# Patient Record
Sex: Female | Born: 1935
Health system: Southern US, Community
[De-identification: ages and names within clinical notes are randomized; demographics above are authoritative.]

## PROBLEM LIST (undated history)

## (undated) DIAGNOSIS — M1990 Unspecified osteoarthritis, unspecified site: Secondary | ICD-10-CM

## (undated) DIAGNOSIS — R609 Edema, unspecified: Secondary | ICD-10-CM

## (undated) DIAGNOSIS — Z96643 Presence of artificial hip joint, bilateral: Secondary | ICD-10-CM

## (undated) DIAGNOSIS — E876 Hypokalemia: Secondary | ICD-10-CM

## (undated) DIAGNOSIS — I824Z2 Acute embolism and thrombosis of unspecified deep veins of left distal lower extremity: Secondary | ICD-10-CM

## (undated) DIAGNOSIS — M19019 Primary osteoarthritis, unspecified shoulder: Secondary | ICD-10-CM

## (undated) DIAGNOSIS — M359 Systemic involvement of connective tissue, unspecified: Secondary | ICD-10-CM

## (undated) DIAGNOSIS — I82409 Acute embolism and thrombosis of unspecified deep veins of unspecified lower extremity: Secondary | ICD-10-CM

## (undated) DIAGNOSIS — I639 Cerebral infarction, unspecified: Secondary | ICD-10-CM

## (undated) DIAGNOSIS — Z90710 Acquired absence of both cervix and uterus: Secondary | ICD-10-CM

## (undated) DIAGNOSIS — I2699 Other pulmonary embolism without acute cor pulmonale: Secondary | ICD-10-CM

## (undated) DIAGNOSIS — M2040 Other hammer toe(s) (acquired), unspecified foot: Secondary | ICD-10-CM

## (undated) DIAGNOSIS — Z7901 Long term (current) use of anticoagulants: Secondary | ICD-10-CM

## (undated) DIAGNOSIS — I10 Essential (primary) hypertension: Secondary | ICD-10-CM

## (undated) DIAGNOSIS — M2669 Other specified disorders of temporomandibular joint: Secondary | ICD-10-CM

## (undated) DIAGNOSIS — Z95828 Presence of other vascular implants and grafts: Secondary | ICD-10-CM

## (undated) DIAGNOSIS — R32 Unspecified urinary incontinence: Secondary | ICD-10-CM

## (undated) DIAGNOSIS — D45 Polycythemia vera: Secondary | ICD-10-CM

## (undated) DIAGNOSIS — B351 Tinea unguium: Secondary | ICD-10-CM

## (undated) DIAGNOSIS — Z933 Colostomy status: Secondary | ICD-10-CM

## (undated) DIAGNOSIS — R262 Difficulty in walking, not elsewhere classified: Secondary | ICD-10-CM

## (undated) DIAGNOSIS — I509 Heart failure, unspecified: Secondary | ICD-10-CM

## (undated) DIAGNOSIS — K5791 Diverticulosis of intestine, part unspecified, without perforation or abscess with bleeding: Secondary | ICD-10-CM

## (undated) HISTORY — PX: TOTAL HIP ARTHROPLASTY: SHX124

## (undated) HISTORY — DX: Long term (current) use of anticoagulants: Z79.01

## (undated) HISTORY — DX: Other pulmonary embolism without acute cor pulmonale: I26.99

## (undated) HISTORY — DX: Presence of artificial hip joint, bilateral: Z96.643

## (undated) HISTORY — DX: Other hammer toe(s) (acquired), unspecified foot: M20.40

## (undated) HISTORY — PX: TOE AMPUTATION: SHX809

## (undated) HISTORY — PX: ABDOMINAL HYSTERECTOMY: SHX81

## (undated) HISTORY — DX: Presence of other vascular implants and grafts: Z95.828

## (undated) HISTORY — PX: FOOT AMPUTATION: SHX951

## (undated) HISTORY — DX: Essential (primary) hypertension: I10

## (undated) HISTORY — DX: Hypokalemia: E87.6

## (undated) HISTORY — PX: COLON SURGERY: SHX602

## (undated) HISTORY — DX: Acquired absence of both cervix and uterus: Z90.710

## (undated) HISTORY — DX: Difficulty in walking, not elsewhere classified: R26.2

## (undated) HISTORY — DX: Unspecified urinary incontinence: R32

## (undated) HISTORY — DX: Unspecified osteoarthritis, unspecified site: M19.90

## (undated) HISTORY — PX: JOINT REPLACEMENT: SHX530

## (undated) HISTORY — DX: Acute embolism and thrombosis of unspecified deep veins of left distal lower extremity: I82.4Z2

## (undated) HISTORY — DX: Colostomy status: Z93.3

## (undated) HISTORY — DX: Tinea unguium: B35.1

## (undated) HISTORY — PX: IVC FILTER INSERTION: CATH118245

## (undated) HISTORY — DX: Acute embolism and thrombosis of unspecified deep veins of unspecified lower extremity: I82.409

## (undated) HISTORY — DX: Primary osteoarthritis, unspecified shoulder: M19.019

## (undated) HISTORY — DX: Diverticulosis of intestine, part unspecified, without perforation or abscess with bleeding: K57.91

## (undated) HISTORY — DX: Other specified disorders of temporomandibular joint: M26.69

---

## 2011-11-28 ENCOUNTER — Ambulatory Visit: Payer: Self-pay | Admitting: Orthopedic Surgery

## 2012-02-19 ENCOUNTER — Inpatient Hospital Stay: Payer: Self-pay | Admitting: *Deleted

## 2012-02-19 LAB — CBC
MCH: 35.9 pg — ABNORMAL HIGH (ref 26.0–34.0)
MCHC: 32.8 g/dL (ref 32.0–36.0)
MCV: 110 fL — ABNORMAL HIGH (ref 80–100)

## 2012-02-19 LAB — URINALYSIS, COMPLETE
Bacteria: NONE SEEN
Bilirubin,UR: NEGATIVE
Blood: NEGATIVE
Glucose,UR: NEGATIVE mg/dL (ref 0–75)
Ketone: NEGATIVE
Leukocyte Esterase: NEGATIVE
Nitrite: NEGATIVE
Ph: 6 (ref 4.5–8.0)
Protein: NEGATIVE
RBC,UR: 2 /HPF (ref 0–5)
Specific Gravity: 1.013 (ref 1.003–1.030)
Squamous Epithelial: 3
WBC UR: 3 /HPF (ref 0–5)

## 2012-02-19 LAB — COMPREHENSIVE METABOLIC PANEL
Anion Gap: 10 (ref 7–16)
Bilirubin,Total: 6.9 mg/dL — ABNORMAL HIGH (ref 0.2–1.0)
Calcium, Total: 8.8 mg/dL (ref 8.5–10.1)
Potassium: 3.3 mmol/L — ABNORMAL LOW (ref 3.5–5.1)
Sodium: 136 mmol/L (ref 136–145)
Total Protein: 7.5 g/dL (ref 6.4–8.2)

## 2012-02-19 LAB — TROPONIN I: Troponin-I: 0.04 ng/mL

## 2012-02-20 LAB — COMPREHENSIVE METABOLIC PANEL
Anion Gap: 10 (ref 7–16)
BUN: 28 mg/dL — ABNORMAL HIGH (ref 7–18)
Bilirubin,Total: 5.4 mg/dL — ABNORMAL HIGH (ref 0.2–1.0)
Chloride: 106 mmol/L (ref 98–107)
Co2: 24 mmol/L (ref 21–32)
Creatinine: 1.62 mg/dL — ABNORMAL HIGH (ref 0.60–1.30)
EGFR (African American): 35 — ABNORMAL LOW
EGFR (Non-African Amer.): 30 — ABNORMAL LOW
Glucose: 95 mg/dL (ref 65–99)
Potassium: 2.9 mmol/L — ABNORMAL LOW (ref 3.5–5.1)
SGOT(AST): 111 U/L — ABNORMAL HIGH (ref 15–37)
SGPT (ALT): 184 U/L — ABNORMAL HIGH
Sodium: 140 mmol/L (ref 136–145)
Total Protein: 6.7 g/dL (ref 6.4–8.2)

## 2012-02-20 LAB — CBC WITH DIFFERENTIAL/PLATELET
Basophil #: 0 10*3/uL (ref 0.0–0.1)
Basophil %: 0.2 %
Eosinophil %: 0.2 %
HCT: 42.3 % (ref 35.0–47.0)
HGB: 13.8 g/dL (ref 12.0–16.0)
Lymphocyte #: 0.6 10*3/uL — ABNORMAL LOW (ref 1.0–3.6)
Lymphocyte %: 7.1 %
MCHC: 32.5 g/dL (ref 32.0–36.0)
MCV: 109 fL — ABNORMAL HIGH (ref 80–100)
Monocyte #: 0.6 x10 3/mm (ref 0.2–0.9)
Monocyte %: 6.4 %
Neutrophil %: 86.1 %
RDW: 16.5 % — ABNORMAL HIGH (ref 11.5–14.5)
WBC: 8.8 10*3/uL (ref 3.6–11.0)

## 2012-02-20 LAB — MAGNESIUM: Magnesium: 2 mg/dL

## 2012-02-20 LAB — IRON AND TIBC
Iron Bind.Cap.(Total): 159 ug/dL — ABNORMAL LOW (ref 250–450)
Iron Saturation: 31 %
Iron: 50 ug/dL (ref 50–170)
Unbound Iron-Bind.Cap.: 109 ug/dL

## 2012-02-20 LAB — CK TOTAL AND CKMB (NOT AT ARMC)
CK, Total: 49 U/L (ref 21–215)
CK-MB: 1 ng/mL (ref 0.5–3.6)

## 2012-02-20 LAB — LIPID PANEL
Cholesterol: 64 mg/dL (ref 0–200)
HDL Cholesterol: 10 mg/dL — ABNORMAL LOW (ref 40–60)
Ldl Cholesterol, Calc: 42 mg/dL (ref 0–100)
Triglycerides: 61 mg/dL (ref 0–200)
VLDL Cholesterol, Calc: 12 mg/dL (ref 5–40)

## 2012-02-20 LAB — PROTIME-INR: INR: 1.5

## 2012-02-20 LAB — LIPASE, BLOOD: Lipase: 93 U/L (ref 73–393)

## 2012-02-21 LAB — COMPREHENSIVE METABOLIC PANEL
Alkaline Phosphatase: 158 U/L — ABNORMAL HIGH (ref 50–136)
Anion Gap: 12 (ref 7–16)
BUN: 32 mg/dL — ABNORMAL HIGH (ref 7–18)
Bilirubin,Total: 3.5 mg/dL — ABNORMAL HIGH (ref 0.2–1.0)
Calcium, Total: 7.9 mg/dL — ABNORMAL LOW (ref 8.5–10.1)
Chloride: 109 mmol/L — ABNORMAL HIGH (ref 98–107)
Co2: 22 mmol/L (ref 21–32)
Creatinine: 2.14 mg/dL — ABNORMAL HIGH (ref 0.60–1.30)
EGFR (African American): 25 — ABNORMAL LOW
EGFR (Non-African Amer.): 22 — ABNORMAL LOW
Glucose: 94 mg/dL (ref 65–99)
Potassium: 3.7 mmol/L (ref 3.5–5.1)
SGPT (ALT): 126 U/L — ABNORMAL HIGH
Total Protein: 6.4 g/dL (ref 6.4–8.2)

## 2012-02-22 LAB — COMPREHENSIVE METABOLIC PANEL
Albumin: 2.1 g/dL — ABNORMAL LOW (ref 3.4–5.0)
Alkaline Phosphatase: 180 U/L — ABNORMAL HIGH (ref 50–136)
Anion Gap: 11 (ref 7–16)
Bilirubin,Total: 2.9 mg/dL — ABNORMAL HIGH (ref 0.2–1.0)
Chloride: 108 mmol/L — ABNORMAL HIGH (ref 98–107)
Glucose: 73 mg/dL (ref 65–99)
Osmolality: 281 (ref 275–301)
Potassium: 3.5 mmol/L (ref 3.5–5.1)
SGOT(AST): 37 U/L (ref 15–37)
Total Protein: 6.1 g/dL — ABNORMAL LOW (ref 6.4–8.2)

## 2012-02-23 LAB — COMPREHENSIVE METABOLIC PANEL
Alkaline Phosphatase: 204 U/L — ABNORMAL HIGH (ref 50–136)
Anion Gap: 11 (ref 7–16)
BUN: 13 mg/dL (ref 7–18)
Bilirubin,Total: 1.7 mg/dL — ABNORMAL HIGH (ref 0.2–1.0)
Chloride: 109 mmol/L — ABNORMAL HIGH (ref 98–107)
Co2: 23 mmol/L (ref 21–32)
EGFR (African American): 51 — ABNORMAL LOW
EGFR (Non-African Amer.): 44 — ABNORMAL LOW
Glucose: 99 mg/dL (ref 65–99)
Osmolality: 281 (ref 275–301)
Sodium: 141 mmol/L (ref 136–145)
Total Protein: 6.1 g/dL — ABNORMAL LOW (ref 6.4–8.2)

## 2012-02-23 LAB — MAGNESIUM: Magnesium: 1.5 mg/dL — ABNORMAL LOW

## 2012-02-24 DIAGNOSIS — I472 Ventricular tachycardia: Secondary | ICD-10-CM

## 2012-02-24 LAB — COMPREHENSIVE METABOLIC PANEL
Anion Gap: 7 (ref 7–16)
Co2: 26 mmol/L (ref 21–32)
Creatinine: 0.95 mg/dL (ref 0.60–1.30)
EGFR (African American): >60
EGFR (Non-African Amer.): 58 — ABNORMAL LOW
Osmolality: 283 (ref 275–301)
Sodium: 143 mmol/L (ref 136–145)
Total Protein: 7.2 g/dL (ref 6.4–8.2)

## 2012-02-24 LAB — STOOL CULTURE

## 2012-02-24 LAB — WBCS, STOOL

## 2012-02-24 LAB — MAGNESIUM: Magnesium: 1.5 mg/dL — ABNORMAL LOW

## 2012-02-25 DIAGNOSIS — I517 Cardiomegaly: Secondary | ICD-10-CM

## 2012-02-25 LAB — HEPATIC FUNCTION PANEL A (ARMC)
Albumin: 2.4 g/dL — ABNORMAL LOW (ref 3.4–5.0)
Alkaline Phosphatase: 263 U/L — ABNORMAL HIGH (ref 50–136)
Bilirubin, Direct: 0.7 mg/dL — ABNORMAL HIGH (ref 0.00–0.20)
Bilirubin,Total: 1.6 mg/dL — ABNORMAL HIGH (ref 0.2–1.0)
SGOT(AST): 53 U/L — ABNORMAL HIGH (ref 15–37)
SGPT (ALT): 77 U/L
Total Protein: 7.1 g/dL (ref 6.4–8.2)

## 2012-02-25 LAB — MAGNESIUM: Magnesium: 1.5 mg/dL — ABNORMAL LOW

## 2012-02-25 LAB — POTASSIUM: Potassium: 3.8 mmol/L (ref 3.5–5.1)

## 2012-02-26 ENCOUNTER — Encounter: Payer: Self-pay | Admitting: Internal Medicine

## 2012-03-03 ENCOUNTER — Encounter: Payer: Self-pay | Admitting: Cardiovascular Disease

## 2012-03-04 LAB — HEPATIC FUNCTION PANEL A (ARMC)
Alkaline Phosphatase: 155 U/L — ABNORMAL HIGH (ref 50–136)
Bilirubin,Total: 0.6 mg/dL (ref 0.2–1.0)
SGOT(AST): 21 U/L (ref 15–37)
SGPT (ALT): 25 U/L
Total Protein: 6.6 g/dL (ref 6.4–8.2)

## 2012-03-04 LAB — BASIC METABOLIC PANEL
Anion Gap: 8 (ref 7–16)
Calcium, Total: 8.3 mg/dL — ABNORMAL LOW (ref 8.5–10.1)
Chloride: 107 mmol/L (ref 98–107)
Co2: 27 mmol/L (ref 21–32)
Creatinine: 0.7 mg/dL (ref 0.60–1.30)
EGFR (Non-African Amer.): >60
Glucose: 88 mg/dL (ref 65–99)
Osmolality: 282 (ref 275–301)
Potassium: 3.9 mmol/L (ref 3.5–5.1)
Sodium: 142 mmol/L (ref 136–145)

## 2012-03-08 ENCOUNTER — Encounter: Payer: Self-pay | Admitting: Cardiovascular Disease

## 2012-03-10 ENCOUNTER — Encounter: Payer: Self-pay | Admitting: Internal Medicine

## 2012-03-12 ENCOUNTER — Encounter: Payer: Self-pay | Admitting: Cardiovascular Disease

## 2012-06-28 DIAGNOSIS — M19019 Primary osteoarthritis, unspecified shoulder: Secondary | ICD-10-CM | POA: Insufficient documentation

## 2012-06-28 HISTORY — DX: Primary osteoarthritis, unspecified shoulder: M19.019

## 2012-08-23 DIAGNOSIS — M25559 Pain in unspecified hip: Secondary | ICD-10-CM | POA: Insufficient documentation

## 2012-08-23 DIAGNOSIS — Z96649 Presence of unspecified artificial hip joint: Secondary | ICD-10-CM | POA: Insufficient documentation

## 2012-09-02 DIAGNOSIS — M2040 Other hammer toe(s) (acquired), unspecified foot: Secondary | ICD-10-CM | POA: Insufficient documentation

## 2012-09-02 DIAGNOSIS — Z89439 Acquired absence of unspecified foot: Secondary | ICD-10-CM | POA: Insufficient documentation

## 2012-09-02 DIAGNOSIS — B351 Tinea unguium: Secondary | ICD-10-CM | POA: Insufficient documentation

## 2012-09-02 DIAGNOSIS — Z96643 Presence of artificial hip joint, bilateral: Secondary | ICD-10-CM

## 2012-09-02 DIAGNOSIS — L988 Other specified disorders of the skin and subcutaneous tissue: Secondary | ICD-10-CM | POA: Insufficient documentation

## 2012-09-02 HISTORY — DX: Presence of artificial hip joint, bilateral: Z96.643

## 2012-09-02 HISTORY — DX: Tinea unguium: B35.1

## 2012-09-02 HISTORY — DX: Other hammer toe(s) (acquired), unspecified foot: M20.40

## 2013-12-02 ENCOUNTER — Emergency Department: Payer: Self-pay | Admitting: Emergency Medicine

## 2013-12-03 LAB — URINALYSIS, COMPLETE
Bilirubin,UR: NEGATIVE
Blood: NEGATIVE
Glucose,UR: NEGATIVE mg/dL (ref 0–75)
KETONE: NEGATIVE
NITRITE: POSITIVE
PH: 6 (ref 4.5–8.0)
Protein: NEGATIVE
RBC,UR: 1 /HPF (ref 0–5)
Specific Gravity: 1.019 (ref 1.003–1.030)
WBC UR: 4 /HPF (ref 0–5)

## 2014-03-18 ENCOUNTER — Emergency Department: Payer: Self-pay | Admitting: Emergency Medicine

## 2014-05-07 ENCOUNTER — Inpatient Hospital Stay: Payer: Self-pay | Admitting: Internal Medicine

## 2014-05-07 LAB — URINALYSIS, COMPLETE
Bilirubin,UR: NEGATIVE
Blood: NEGATIVE
GLUCOSE, UR: NEGATIVE mg/dL (ref 0–75)
Ketone: NEGATIVE
Nitrite: NEGATIVE
PH: 6 (ref 4.5–8.0)
PROTEIN: NEGATIVE
Specific Gravity: 1.02 (ref 1.003–1.030)

## 2014-05-07 LAB — COMPREHENSIVE METABOLIC PANEL
ALT: 14 U/L (ref 12–78)
ANION GAP: 9 (ref 7–16)
Albumin: 3.2 g/dL — ABNORMAL LOW (ref 3.4–5.0)
Alkaline Phosphatase: 92 U/L
BILIRUBIN TOTAL: 0.8 mg/dL (ref 0.2–1.0)
BUN: 17 mg/dL (ref 7–18)
CHLORIDE: 102 mmol/L (ref 98–107)
CO2: 26 mmol/L (ref 21–32)
Calcium, Total: 8.8 mg/dL (ref 8.5–10.1)
Creatinine: 0.92 mg/dL (ref 0.60–1.30)
EGFR (African American): >60
EGFR (Non-African Amer.): 59 — ABNORMAL LOW
Glucose: 115 mg/dL — ABNORMAL HIGH (ref 65–99)
Osmolality: 276 (ref 275–301)
Potassium: 2.4 mmol/L — CL (ref 3.5–5.1)
SGOT(AST): 18 U/L (ref 15–37)
Sodium: 137 mmol/L (ref 136–145)
TOTAL PROTEIN: 7.9 g/dL (ref 6.4–8.2)

## 2014-05-07 LAB — CBC
HCT: 44.6 % (ref 35.0–47.0)
HGB: 14.3 g/dL (ref 12.0–16.0)
MCH: 34.2 pg — AB (ref 26.0–34.0)
MCHC: 32 g/dL (ref 32.0–36.0)
MCV: 107 fL — ABNORMAL HIGH (ref 80–100)
PLATELETS: 840 10*3/uL — AB (ref 150–440)
RBC: 4.18 10*6/uL (ref 3.80–5.20)
RDW: 16 % — AB (ref 11.5–14.5)
WBC: 4.9 10*3/uL (ref 3.6–11.0)

## 2014-05-07 LAB — LIPASE, BLOOD: LIPASE: 75 U/L (ref 73–393)

## 2014-05-07 LAB — MAGNESIUM: Magnesium: 1.6 mg/dL — ABNORMAL LOW

## 2014-05-08 LAB — BASIC METABOLIC PANEL
ANION GAP: 4 — AB (ref 7–16)
BUN: 15 mg/dL (ref 7–18)
CALCIUM: 8.4 mg/dL — AB (ref 8.5–10.1)
CO2: 30 mmol/L (ref 21–32)
Chloride: 104 mmol/L (ref 98–107)
Creatinine: 0.74 mg/dL (ref 0.60–1.30)
EGFR (African American): >60
Glucose: 101 mg/dL — ABNORMAL HIGH (ref 65–99)
Osmolality: 277 (ref 275–301)
Potassium: 2.8 mmol/L — ABNORMAL LOW (ref 3.5–5.1)
SODIUM: 138 mmol/L (ref 136–145)

## 2014-05-08 LAB — POTASSIUM: Potassium: 3.7 mmol/L (ref 3.5–5.1)

## 2014-05-08 LAB — MAGNESIUM: MAGNESIUM: 2.6 mg/dL — AB

## 2014-05-09 LAB — BASIC METABOLIC PANEL
ANION GAP: 3 — AB (ref 7–16)
BUN: 16 mg/dL (ref 7–18)
CALCIUM: 8.6 mg/dL (ref 8.5–10.1)
CO2: 26 mmol/L (ref 21–32)
Chloride: 110 mmol/L — ABNORMAL HIGH (ref 98–107)
Creatinine: 0.75 mg/dL (ref 0.60–1.30)
EGFR (African American): >60
EGFR (Non-African Amer.): >60
GLUCOSE: 98 mg/dL (ref 65–99)
Osmolality: 279 (ref 275–301)
Potassium: 3.4 mmol/L — ABNORMAL LOW (ref 3.5–5.1)
Sodium: 139 mmol/L (ref 136–145)

## 2014-05-09 LAB — PLATELET COUNT: PLATELETS: 738 10*3/uL — AB (ref 150–440)

## 2014-07-08 ENCOUNTER — Emergency Department: Payer: Self-pay | Admitting: Internal Medicine

## 2014-07-08 LAB — URINALYSIS, COMPLETE
Bilirubin,UR: NEGATIVE
Blood: NEGATIVE
Glucose,UR: NEGATIVE mg/dL (ref 0–75)
KETONE: NEGATIVE
Nitrite: POSITIVE
PROTEIN: NEGATIVE
Ph: 6 (ref 4.5–8.0)
RBC,UR: 1 /HPF (ref 0–5)
SPECIFIC GRAVITY: 1.019 (ref 1.003–1.030)
Squamous Epithelial: 2
WBC UR: 45 /HPF (ref 0–5)

## 2014-07-08 LAB — COMPREHENSIVE METABOLIC PANEL
ALBUMIN: 2.9 g/dL — AB (ref 3.4–5.0)
ALT: 12 U/L — AB
AST: 23 U/L (ref 15–37)
Alkaline Phosphatase: 75 U/L
Anion Gap: 9 (ref 7–16)
BUN: 15 mg/dL (ref 7–18)
Bilirubin,Total: 0.6 mg/dL (ref 0.2–1.0)
CHLORIDE: 108 mmol/L — AB (ref 98–107)
Calcium, Total: 8.4 mg/dL — ABNORMAL LOW (ref 8.5–10.1)
Co2: 27 mmol/L (ref 21–32)
Creatinine: 0.64 mg/dL (ref 0.60–1.30)
EGFR (Non-African Amer.): >60
GLUCOSE: 90 mg/dL (ref 65–99)
OSMOLALITY: 287 (ref 275–301)
Potassium: 3.7 mmol/L (ref 3.5–5.1)
Sodium: 144 mmol/L (ref 136–145)
Total Protein: 7.1 g/dL (ref 6.4–8.2)

## 2014-07-08 LAB — CBC WITH DIFFERENTIAL/PLATELET
BASOS ABS: 0 10*3/uL (ref 0.0–0.1)
Basophil %: 0.8 %
EOS PCT: 2.3 %
Eosinophil #: 0.1 10*3/uL (ref 0.0–0.7)
HCT: 42 % (ref 35.0–47.0)
HGB: 13.8 g/dL (ref 12.0–16.0)
LYMPHS ABS: 0.8 10*3/uL — AB (ref 1.0–3.6)
Lymphocyte %: 21.1 %
MCH: 34.3 pg — AB (ref 26.0–34.0)
MCHC: 32.9 g/dL (ref 32.0–36.0)
MCV: 104 fL — ABNORMAL HIGH (ref 80–100)
MONOS PCT: 6.1 %
Monocyte #: 0.2 x10 3/mm (ref 0.2–0.9)
NEUTROS ABS: 2.6 10*3/uL (ref 1.4–6.5)
Neutrophil %: 69.7 %
Platelet: 834 10*3/uL — ABNORMAL HIGH (ref 150–440)
RBC: 4.02 10*6/uL (ref 3.80–5.20)
RDW: 18.3 % — ABNORMAL HIGH (ref 11.5–14.5)
WBC: 3.7 10*3/uL (ref 3.6–11.0)

## 2014-07-08 LAB — LIPASE, BLOOD: Lipase: 58 U/L — ABNORMAL LOW (ref 73–393)

## 2014-07-08 LAB — TROPONIN I

## 2014-11-20 ENCOUNTER — Ambulatory Visit: Payer: Self-pay | Admitting: Orthopedic Surgery

## 2015-01-10 DIAGNOSIS — M1990 Unspecified osteoarthritis, unspecified site: Secondary | ICD-10-CM | POA: Insufficient documentation

## 2015-01-10 DIAGNOSIS — W19XXXA Unspecified fall, initial encounter: Secondary | ICD-10-CM | POA: Insufficient documentation

## 2015-01-11 DIAGNOSIS — E860 Dehydration: Secondary | ICD-10-CM | POA: Insufficient documentation

## 2015-01-14 ENCOUNTER — Emergency Department: Payer: Self-pay | Admitting: Emergency Medicine

## 2015-03-03 NOTE — H&P (Signed)
Subjective/Chief Complaint Abdominal pain   History of Present Illness 81 year old female with history of polycythemia vera, HTN presents with abdominal pain for about 2 weeks. Abdominal pain is nonradiating, dull ache in the LLQ that is intermittent in nature and gets worse with movement and improves with rest. She saw her PCP 1 week ago who felt this may be exacerbation of her known diverticulitis. Subsequently pain got progressively and she devloped severe constipation, last BM was 05/02/14; 5 days prior to admission. She denies fevers, chills, vomiting but admits to some nause today. Of note she had a tomato sandwhich prior to abdominal pain onset. History was obtained from the patient, her granddaughter and review of medical records.  In ED she was noted to be severely hypokalemic with potassium 2.4 prompting admission. On review of medical records she was last hospitalized 2 years ago and had severe metabolic derangements, hypokalemia and hypomagnesemia and had intermittent episodes of nonsustained VT. Echocardiogram at the time showed preserved EF 55%. Per granddaughter patient has had hypokalemia since being on lasix for some leg swelling and is on low dose potassium supplement.   Past History PAST MEDICAL HISTORY:  1. Hypertension.  2. Polycythemia vera.  3. Deep venous thrombosis of the right lower extremity.  4.           Diverticulitis  PAST SURGICAL HISTORY:  1. Bilateral hip replacement.  2. 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  3.           Transmetatarsal amputation right foot. 4. Hysterectomy.   Primary Physician Dr. Darnell Level at [facility] [place]:  No Known Allergies:   HOME MEDICATIONS: Medication Instructions Status  hydroxyurea 500 mg oral capsule 3 caps (1548m) orally once a day on Monday, Tuesday, Wednesday, and Thursday, Take 4 caps (20048m orally  once a day on Friday, Saturday, and Sunday. Active  furosemide 20 mg oral tablet 1 tab(s) orally once a day as needed for swelling. Active  potassium chloride 20 mEq oral tablet, extended release 1 tab(s) orally once a day Active  Econazole Nitrate 1% topical cream Apply topically to left foot 2 times a day Active  acetaminophen-HYDROcodone 325 mg-5 mg oral tablet 1 tab(s) orally once a day, As Needed - for Pain Active  aspirin 81 mg oral tablet 1 tab(s) orally once a day Active  amLODIPine 2.5 mg oral tablet 1 tab(s) orally once a day Active  hydrochlorothiazide 50 mg oral tablet 1 tab(s) orally once a day Active   Family and Social History:  Family History Hypertension  arthritis   Social History negative tobacco, negative ETOH, negative Illicit drugs   Place of Living Independent living facility   Review of Systems:  Subjective/Chief Complaint abdominal pain   Fever/Chills No  no fatigue   Cough No   Sputum No   Abdominal Pain Yes   Diarrhea No   Constipation Yes   Nausea/Vomiting Yes  no vomiting   SOB/DOE No   Chest Pain No  no palpitations, no leg swelling   Telemetry Reviewed NSR   Dysuria No   Tolerating PT Yes   Tolerating Diet Yes   ROS No eye pain, no blurred vomiting, no tinnitus, no ear pain,  no arthralgias, no myalgias, no skin rashes, no frequency, no dysuria, no heat/cold intolerance   Medications/Allergies Reviewed Medications/Allergies reviewed   Physical Exam:  GEN well  developed, well nourished, 111/83, 59, 56, 94% on RA   HEENT PERRL, hearing intact to voice, moist oral mucosa, Oropharynx clear, good dentition, normal eye lids   NECK supple  No masses   RESP normal resp effort  clear BS   CARD regular rate  no murmur  No LE edema  no JVD   ABD denies tenderness  soft  normal BS  no Adominal Mass   EXTR negative cyanosis/clubbing, negative edema, TMA right foot   SKIN normal to palpation, No rashes, skin turgor good   NEURO  cranial nerves intact, follows commands, motor/sensory function intact   PSYCH alert, A+O to time, place, person, good insight   Lab Results: Hepatic:  28-Jun-15 11:32   Bilirubin, Total -  Alkaline Phosphatase - (45-117 NOTE: New Reference Range 09/30/13)  SGPT (ALT) -  SGOT (AST) -  Total Protein, Serum -  Albumin, Serum -    12:11   Bilirubin, Total 0.8  Alkaline Phosphatase 92 (45-117 NOTE: New Reference Range 09/30/13)  SGPT (ALT) 14  SGOT (AST) 18  Total Protein, Serum 7.9  Albumin, Serum  3.2  Routine Chem:  28-Jun-15 11:32   Lipase - (Result(s) reported on 07 May 2014 at 12:07PM.)  Glucose, Serum -  BUN -  Creatinine (comp) -  Sodium, Serum -  Potassium, Serum -  Chloride, Serum -  CO2, Serum -  Calcium (Total), Serum -  Osmolality (calc) -  eGFR (African American) -  eGFR (Non-African American) - (eGFR values <22m/min/1.73 m2 may be an indication of chronic kidney disease (CKD). Calculated eGFR is useful in patients with stable renal function. The eGFR calculation will not be reliable in acutely ill patients when serum creatinine is changing rapidly. It is not useful in  patients on dialysis. The eGFR calculation may not be applicable to patients at the low and high extremes of body sizes, pregnant women, and vegetarians.)  Result Comment chem labs - cancel specimen hemolyzed  - called to [phone] - collected by [email or web address] [phone]  - 05/07/2014 caf  Result(s) reported on 07 May 2014 at 12:07PM.  Anion Gap -    12:11   Magnesium, Serum  1.6 (1.8-2.4 THERAPEUTIC RANGE: 4-7 mg/dL TOXIC: > 10 mg/dL  -----------------------)  Lipase 75 (Result(s) reported on 07 May 2014 at 12:37PM.)  Glucose, Serum  115  BUN 17  Creatinine (comp) 0.92  Sodium, Serum 137  Potassium, Serum  2.4  Chloride, Serum 102  CO2, Serum 26  Calcium (Total), Serum 8.8  Osmolality (calc) 276  eGFR (African American) >60  eGFR (Non-African American)  59 (eGFR  values <665mmin/1.73 m2 may be an indication of chronic kidney disease (CKD). Calculated eGFR is useful in patients with stable renal function. The eGFR calculation will not bereliable in acutely ill patients when serum creatinine is changing rapidly. It is not useful in  patients on dialysis. The eGFR calculation may not be applicable to patients at the low and high extremes of body sizes, pregnant women, and vegetarians.)  Result Comment POTASSIUM      - RESULTS VERIFIED BY REPEAT TESTING.  - NOTIFIED OF CRITICAL VALUE  - CALLED TO JENNIFER INGERSOLL @ 1248 ON  - 05/07/2014  - READ-BACK PROCESS PERFORMED.  Result(s) reported on 07 May 2014 at 12:41PM.  Anion Gap 9  Routine UA:  28-Jun-15 13:25   Color (UA) Amber  Clarity (UA) Hazy  Glucose (UA) Negative  Bilirubin (UA) Negative  Ketones (UA)  Negative  Specific Gravity (UA) 1.020  Blood (UA) Negative  pH (UA) 6.0  Protein (UA) Negative  Nitrite (UA) Negative  Leukocyte Esterase (UA) Trace (Result(s) reported on 07 May 2014 at 01:47PM.)  RBC (UA) 1 /HPF  WBC (UA) 23 /HPF  Bacteria (UA) 3+  Epithelial Cells (UA) 2 /HPF  Mucous (UA) PRESENT (Result(s) reported on 07 May 2014 at 01:47PM.)  Routine Hem:  28-Jun-15 11:32   WBC (CBC) 4.9  RBC (CBC) 4.18  Hemoglobin (CBC) 14.3  Hematocrit (CBC) 44.6  Platelet Count (CBC)  840 (Result(s) reported on 07 May 2014 at 11:42AM.)  MCV  107  MCH  34.2  MCHC 32.0  RDW  16.0   Radiology Results: XRay:    28-Jun-15 11:57, Abdomen 3 Way Includes PA Chest  Abdomen 3 Way Includes PA Chest  REASON FOR EXAM:    diffuse abd pain with movement, last BM 1 week ago  COMMENTS:   May transport without cardiac monitor    PROCEDURE: DXR - DXR ABDOMEN 3-WAY (INCL PA CXR)  - May 07 2014 11:57AM     CLINICAL DATA:  Diffuse abdominal pain. Last bowel movement 1 week  ago.    EXAM:  ABDOMEN SERIES    COMPARISON:  None.    FINDINGS:  Unchanged appearance of the chest. Borderline heart  size.  Subsegmental atelectasis. Elevation of the RIGHT hemidiaphragm with  eventration. No free air underneath the hemidiaphragms.  Nonobstructive bowel gas pattern. No pathologic air-fluid levels.  Moderate stool burden. No bowel dilation. Severe lower lumbar  spondylosis incidentally noted. Bilateral total hip arthroplasties.     IMPRESSION:  No acute abnormality.  Moderate stool burden.      Electronically Signed    By: Dereck Ligas M.D.    On: 05/07/2014 12:06       Verified By: Melvyn Novas, M.D.,    Assessment/Admission Diagnosis Telemetry reviewed by me: NSR at 60 bpm EKG: NSR at 63 bpm, viewed by me  81 year old female with probable acute diverticulitis, severe constipation and diruetic induced severe hypokalemia and hypomagnesemia   Plan 1. Severe hypokalemia - s/p 19mq of potassium in ED - Addional 80 meq PO, then recheck potassium in Am - replete Mg - place on telemetry - would increase daily potassium supplement on discharge  2. Probable acute diverticulitis - IV cipro and flagyl empirically. - if abdominal symptoms worse, check CT abdomen  3. Hypomagnesemia - replete - check magnesium level in am  4. Severe constipation - Aggressive bowel regimen, miralax BID, dulcolax daily, docusate BID  5. Polycythemia Vera, stable. Continue hydroxyurea and ASA  6. Essential Hypertension: stable on HCTZ, lasix, norvasc.   F/E/N: HH diet DVT prophylaxis: Lovenox  Code status: FULL CODE, surrogate decision maker, Grand daughter BKarleen [phone] Time Spent 70 minutes   Electronic Signatures: OSamson Frederic(MD)  (Signed 28-Jun-15 15:32)  Authored: CHIEF COMPLAINT and HISTORY, ALLERGIES, HOME MEDICATIONS, FAMILY AND SOCIAL HISTORY, REVIEW OF SYSTEMS, PHYSICAL EXAM, LABS, Radiology, ASSESSMENT AND PLAN   Last Updated: 28-Jun-15 15:32 by OSamson Frederic(MD)

## 2015-03-03 NOTE — Discharge Summary (Signed)
PATIENT NAME:  Yolanda Wells, Yolanda Wells MR#:  [id number] DATE OF BIRTH:  15-Sep-1932  DATE OF ADMISSION:  05/07/2014 DATE OF DISCHARGE:  05/09/2014  PRIMARY CARE PHYSICIAN: Nonlocal.  FINAL DIAGNOSES:  1. Severe hypokalemia.  2. Hypomagnesemia.  3. Abdominal pain and constipation.  4. Hypertension.  5. Polycythemia vera with thrombocytosis.  6. Possible urinary tract infection. Recommend checking a potassium and magnesium level in followup appointment.   MEDICATIONS ON DISCHARGE: Include aspirin 81 mg daily, amlodipine 2.5 mg daily, hydroxyurea 500 mg 3 capsules on Monday, Tuesday, Wednesday, and Thursday 4 capsules on Friday, Saturday, and Sunday. Lasix 20 mg once a day as needed for swelling, potassium chloride 20 mEq daily, econazole nitrate 1% topical cream apply to left foot twice a day, acetaminophen hydrocodone 325/5 one 1 tablet once a day as needed for pain, MiraLax 17 grams once a day, Cipro 500 mg 1 tablet every 12 hours for 3 days. Stop hydrochlorothiazide.   DIET: Low sodium diet.   Home health with physical therapy.   ACTIVITY: As tolerated.   FOLLOWUP: Followup 1 to 2 weeks with your medical doctor.   HOSPITAL COURSE: The patient was admitted 05/07/2014, discharged 05/09/2014. Came in with weakness, abdominal pain.   LABORATORY AND RADIOLOGICAL DATA DURING THE HOSPITAL COURSE: Included a white blood cell count of 4.9, hemoglobin 14.3, platelet count of 840,000.  Chest and abdominal x-ray showed no acute abnormality, moderate stool burden.   Magnesium 1.6, lipase 75, glucose 115, BUN 17, creatinine 0.92, sodium 137, potassium 2.4, chloride 102, CO2 of 26, calcium 8.8. Liver function tests normal range. Urinalysis positive for trace leukocyte esterase, 3+ bacteria.   EKG: Normal sinus rhythm, left axis deviation, right bundle branch block.   Magnesium supplemented up to 2.6, potassium on the 29th still low at 2.8. Platelet count on discharge 738,000, potassium upon discharge up to  3.4.  HOSPITAL COURSE PER PROBLEM LIST:  1. For the patient's severe hypokalemia: This was replaced aggressively during the entire hospital course. I believe the etiology of the hypokalemia is the high-dose hydrochlorothiazide. This was stopped. Can continue her usual potassium supplementation as an outpatient. Recheck potassium as outpatient followup appointment. The patient does also take Lasix as needed and can continue that.  2. Hypomagnesemia, I believe this was also secondary to the hydrochlorothiazide. This was replaced aggressively during the hospitalization replaced actually higher than normal range. No need for magnesium supplementation upon discharge.  3. Abdominal pain and constipation. Normally the patient does not have constipation. Her abdominal pain improved after a bowel movement. I put her on MiraLax daily.  4. Hypertension. Blood pressure is stable off the hydrochlorothiazide. The patient is on amlodipine.  5. Polycythemia vera with thrombocytosis. Follow up platelet count as outpatient. I believe the platelet count was up higher than her usual secondary to the platelets being acute phase reactant when the patient is ill and in the hospital. Follow up as outpatient.  6. Possible urinary tract infection. Unfortunately, no urine culture was sent in the hospital. The patient was empirically started on Cipro and Flagyl thinking this could be a possible diverticulitis. I did stop those antibiotics but since the urinalysis was positive, I did give a few more days of Cipro.  TIME SPENT ON DISCHARGE: 35 minutes.     ____________________________ Tana Conch. Leslye Peer, MD rjw:lt D: 05/09/2014 14:20:44 ET T: 05/09/2014 21:15:54 ET JOB#: [id number]  cc: Tana Conch. Leslye Peer, MD, <Dictator> Marisue Brooklyn MD ELECTRONICALLY SIGNED 05/11/2014 16:19

## 2015-03-04 NOTE — Consult Note (Signed)
Chief Complaint:   Subjective/Chief Complaint LFT continues to    improve. Still with some diarrhea. Stool studies neg. No nausea. Prior exposure to Hep A and B according to serologies. Other liver serologies pending.   VITAL SIGNS/ANCILLARY NOTES: **Vital Signs.:   15-Apr-13 11:40   Vital Signs Type Routine   Temperature Temperature (F) 98.2   Celsius 36.7   Temperature Source oral   Pulse Pulse 68   Respirations Respirations 20   Systolic BP Systolic BP 520   Diastolic BP (mmHg) Diastolic BP (mmHg) 78   Mean BP 93   BP Source Dinamap   Pulse Ox % Pulse Ox % 97   Pulse Ox Activity Level  At rest   Oxygen Delivery Room Air/ 21 %   Brief Assessment:   Cardiac Regular    Respiratory clear BS    Gastrointestinal Normal   Routine Chem:  15-Apr-13 05:42    Glucose, Serum 99   BUN 13   Creatinine (comp) 1.19   Sodium, Serum 141   Potassium, Serum 3.1   Chloride, Serum 109   CO2, Serum 23   Calcium (Total), Serum 7.3  Hepatic:  15-Apr-13 05:42    Bilirubin, Total 1.7   Alkaline Phosphatase 204   SGPT (ALT) 75   SGOT (AST) 46   Total Protein, Serum 6.1   Albumin, Serum 2.0  Routine Chem:  15-Apr-13 05:42    Osmolality (calc) 281   eGFR (African American) 51   eGFR (Non-African American) 44   Anion Gap 11   Magnesium, Serum 1.5   Assessment/Plan:  Assessment/Plan:   Assessment LFT improving. Diarrhea persists.    Plan Try solids. Ok to try imodium prn if stool neg. I will be out tomorrow and back on Wed. If patient to be discharged tomorrow, then make sure patient f/u with [place] in office in 1-2 wks. Thanks.   Electronic Signatures: Verdie Shire (MD)  (Signed 15-Apr-13 12:45)  Authored: Chief Complaint, VITAL SIGNS/ANCILLARY NOTES, Brief Assessment, Lab Results, Assessment/Plan   Last Updated: 15-Apr-13 12:45 by Verdie Shire (MD)

## 2015-03-04 NOTE — Discharge Summary (Signed)
PATIENT NAME:  Yolanda Wells, Yolanda Wells MR#:  [id number] DATE OF BIRTH:  1932-05-25  DATE OF ADMISSION:  02/19/2012 DATE OF DISCHARGE:  02/25/2012  ADDENDUM: I have  adjusted her blood pressure medications now. She is on the following. 1. Aspirin 81 mg daily.  2. Potassium chloride 20 mEq daily.  3. Hydroxyurea 5 mg three capsules orally every other day alternating with two capsules orally every other day.  4. Naproxen 500 mg once daily as needed for joint pain.  5. Change amlodipine to 5 mg once daily. 6. Metoprolol 25 mg p.o. twice a day. 7. I have added Losartan at 50 mg p.o. once daily.  8. Magnesium oxide 400 mg p.o. twice a day for five days.   Her blood pressure medications can be further adjusted as an outpatient. ____________________________ Mena Pauls, MD ag:slb D: 02/25/2012 12:37:52 ET T: 02/25/2012 12:58:50 ET JOB#: [id number]  cc: Mena Pauls, MD, <Dictator> Mena Pauls MD ELECTRONICALLY SIGNED 03/04/2012 11:28

## 2015-03-04 NOTE — Consult Note (Signed)
Brief Consult Note: Diagnosis: Asymptomatic nonsustained ventricular tachycardia.   Patient was seen by consultant.   Consult note dictated.   Comments: Likely due to electrolytes abnormalities.  Recommend:  Replace K (>4) and Mag (>2).  Check TSH and 2 D ECho.  Agree with small dose Metoprolol.  Can consider outpatient stress test.  Electronic Signatures: Kathlyn Sacramento (MD)  (Signed 16-Apr-13 19:11)  Authored: Brief Consult Note   Last Updated: 16-Apr-13 19:11 by Kathlyn Sacramento (MD)

## 2015-03-04 NOTE — Consult Note (Signed)
Chief Complaint:   Subjective/Chief Complaint See Dawn Harrison's notes. Feels sl better. Still nauseous. LFt improving. Cr getting worse. GB wall thickened but patient in no pain. CBD appear normal on MRI.   VITAL SIGNS/ANCILLARY NOTES: **Vital Signs.:   13-Apr-13 08:12   Vital Signs Type Routine   Temperature Temperature (F) 98.4   Celsius 36.8   Temperature Source oral   Pulse Pulse 69   Pulse source per Dinamap   Respirations Respirations 18   Systolic BP Systolic BP 841   Diastolic BP (mmHg) Diastolic BP (mmHg) 69   Mean BP 87   BP Source Dinamap   Pulse Ox % Pulse Ox % 100   Pulse Ox Activity Level  At rest   Oxygen Delivery Room Air/ 21 %   Brief Assessment:   Cardiac Regular    Respiratory clear BS    Gastrointestinal Normal     Routine Chem:  13-Apr-13 05:12    Glucose, Serum 94   BUN 32   Creatinine (comp) 2.14   Sodium, Serum 143   Potassium, Serum 3.7   Chloride, Serum 109   CO2, Serum 22   Calcium (Total), Serum 7.9  Hepatic:  13-Apr-13 05:12    Bilirubin, Total 3.5   Alkaline Phosphatase 158   SGPT (ALT) 126   SGOT (AST) 53   Total Protein, Serum 6.4   Albumin, Serum 2.2  Routine Chem:  13-Apr-13 05:12    Osmolality (calc) 292   eGFR (African American) 25   eGFR (Non-African American) 22   Anion Gap 12   Magnesium, Serum 2.0   Radiology Results: MRI:    13-Apr-13 10:21, MRI Abdomen Without Contrast   MRI Abdomen Without Contrast    REASON FOR EXAM:    elevated LFT's abdnormal ultrasound  COMMENTS:       PROCEDURE: MR  - MR ABDOMEN WO CONTRAST  - Feb 21 2012 10:21AM     RESULT:     History: Elevated LFTs.    Comparison Study: Prior ultrasound of the abdomen of 02/19/2012.    Findings: Standard MRI of the abdomen was obtained. There is mild   atelectasis versus infiltrate right lung base. Liver is normal. Mild   thickening of the gallbladder wall is noted. Gallstones cannot be   excluded. Reference is made to recent  ultrasoundreport. Ultrasound gives     a more accurate depiction of gallbladder pathology. Minimal fullness   noted of the pancreatic head. No discrete mass identified. No biliary   ductal distention noted. Multiple right renal cysts are noted. Adrenals   are normal. Degenerative changes of the lumbar spine.    IMPRESSION:      1. Gallstones and thickening of the gallbladder wall. Reference is made   to recent ultrasound report. No focal hepatic abnormality or biliary   distention.     2. Renal cysts.      Thank you for the opportunity to contribute to the care of your patient.     Verified By: Osa Craver, M.D., MD   Assessment/Plan:  Assessment/Plan:   Assessment LFT abnormality. Gradually improving. Suspect hepatic process, either infectious, hypoxia/ischemic, etc. and not GB or biliry process.    Plan Await hepatitis serologies. Moniter renal function. IV hydration.   Electronic Signatures: Verdie Shire (MD)  (Signed 13-Apr-13 11:47)  Authored: Chief Complaint, VITAL SIGNS/ANCILLARY NOTES, Brief Assessment, Lab Results, Radiology Results, Assessment/Plan   Last Updated: 13-Apr-13 11:47 by Verdie Shire (MD)

## 2015-03-04 NOTE — Consult Note (Signed)
PATIENT NAME:  Yolanda Wells, Yolanda Wells MR#:  [id number] DATE OF BIRTH:  1932-09-13  DATE OF CONSULTATION:  02/21/2012  REFERRING PHYSICIAN:   CONSULTING PHYSICIAN:  Rodena Goldmann III, MD  PRIMARY CARE PHYSICIAN: [facility]   CHIEF COMPLAINT: Weakness, nausea, jaundice.    BRIEF HISTORY: Yolanda Wells is a 81 year old woman recently admitted to the hospital with weakness, malaise with some mild nausea. She vomited on two occasions. She has not been eating well over the last several days and noted that she was completely fatigued over the last couple of days. She was admitted to the hospital for further investigation as she was noted to be mildly hypotensive in the Emergency Room. Work-up in the Emergency Room revealed normal troponins but elevated liver function studies with a bilirubin of 6.9, alkaline phosphatase is 150 with SGOT of 230 and SGPT of 279. Gallbladder ultrasound was performed which revealed thickened gallbladder wall, what appeared to be multiple gallstones and possible cholesterolosis. CT scan of the chest was performed to rule pneumonia and she was noted to have some signs and symptoms on CT imaging consistent with possible portal hypertension. She does not carry any previous history of portal hypertension although she does have history of deep venous thrombosis bleed I believed to be related to polycythemia vera. She also has history of hypertension. She denies any other significant GI symptoms. Initially she and her family did not want to consider surgical evaluation but she began to vomit this morning and surgical evaluation was sought by the primary care team.   M.R.C.P. was performed this morning which demonstrated gallstones and thickening of the gallbladder wall with no evidence of any significant liver abnormality or ductal abnormality. Over the last several days her liver function studies have improved to where bilirubin is now 3.5 with alkaline phosphatase 158, SGOT 53 and SGPT of  126. Her white blood count is normal at 8800. She has a slightly elevated platelet count at near one-half million. She is slowly becoming azotemic with slight rise in her BUN and creatinine from 21 and 0.9 on admission to 32 and 2.14 today. Electrolytes are otherwise largely unremarkable. She does not have any evidence of acidosis.   CURRENT MEDICATIONS:  1. Amlodipine. 2. Hydroxyurea. 3. Aspirin. 4. Potassium.  5. Losartan.   ALLERGIES: She does not have any drug allergies.   PAST SURGICAL HISTORY:  1. Bilateral hip replacements. 2. Right toe amputations. 3. Hysterectomy.   PAST MEDICAL HISTORY: She has history of thyroid mass identified a number of years ago which has not been recently worked up, was ascribed to a benign condition in the past and she has not had any further investigation.   SOCIAL HISTORY: She lives in assisted living facility and does not had any significant tobacco or alcohol history.   REVIEW OF SYSTEMS: Reviewed from the medical admission and I agree with the findings.   PHYSICAL EXAMINATION:  GENERAL: She is sitting in a chair, comfortable, clinically jaundiced with no significant complaints at the present time. She did notice some nausea this morning and vomited one time but is not having any abdominal pain or nausea at the present time.   VITAL SIGNS: Blood pressure 120/76 with a heart rate of 78, respiratory rate 20 and she is afebrile. Oxygen saturation is satisfactory on room air.   HEENT: Reveals obvious scleral icterus. Pupils were equally round. She has no facial deformities.   NECK: Supple without adenopathy and her trachea is midline. I cannot palpate her  thyroid gland.   CHEST: Clear bilaterally with no adventitious sounds. No rhonchi. No rales. She appears to have normal pulmonary excursion.   CARDIAC: Exam reveals a 2/6 systolic murmur but no rhythm disturbances.   ABDOMEN: Benign with no significant abdominal tenderness. I cannot palpate her  gallbladder. She has active bowel sounds. No hernias are noted. She has no significant abdominal tenderness.   EXTREMITIES: Low extremity exam reveals surgical changes in her hips and right foot. She does have decreased pulses on both sides but no significant decreased range of motion.   PSYCHIATRIC: Normal orientation and she appears to be normal affect without depression.   LABORATORY, DIAGNOSTIC AND RADIOLOGICAL DATA: I have independently reviewed her laboratory work and her CT scan. These studies suggest that she does have evidence of cholecystolithiasis without evidence of acute cholecystitis.   IMPRESSION AND RECOMMENDATIONS: Her clinical presentation is not consistent with biliary tract disease. I would be concerned if she does have evidence of portal hypertension but the gallbladder wall thickening and jaundice are secondary to a liver problems rather than to her biliary tract. She certainly does not have a surgical situation at the present time and I do not see any indication for surgical intervention. At this point will continue to follow her and be available to assist in her management if required.   ____________________________ Micheline Maze, MD rle:cms D: 02/21/2012 14:27:03 ET T: 02/22/2012 09:24:53 ET JOB#: [id number]  cc: Rodena Goldmann III, MD, <Dictator> Rodena Goldmann MD ELECTRONICALLY SIGNED 02/23/2012 10:40

## 2015-03-04 NOTE — Discharge Summary (Signed)
PATIENT NAME:  Yolanda Wells, Yolanda Wells MR#:  [id number] DATE OF BIRTH:  Feb 05, 1932  DATE OF ADMISSION:  02/19/2012 DATE OF DISCHARGE:  02/25/2012  DISCHARGE DIAGNOSES:  1. Acute renal failure, resolved.  2. Elevated liver function tests, improving. 3. Leukocytosis, resolved. 4. Hypomagnesemia.  5. Hypokalemia.  6. Hypertension.  7. History of polycythemia vera. 8. History of deep vein thrombosis in the past.   CONSULTATIONS:  1. GI, Dr. Verdie Shire. 2. Surgery, Dr. Bronson Ing. 3. Cardiology, Dr. Fletcher Anon.   HOSPITAL COURSE: A 81 year old female. She presented with weakness, nausea, vomiting, and poor p.o. intake. She has history of hypertension and polycythemia vera. She was admitted as presumed pneumonia, elevated liver function tests and she was also hypotensive when she came in. When she presented to the Emergency Room she was noted to have elevated liver function tests with alkaline phosphatase of 150, ALT of 279, AST of 230 and bilirubin of 6.9. Her creatinine was normal at admission at 0.9 and she was slightly hypokalemic at 3.3. Her cardiac enzymes were negative. GI was consulted and she had extensive work-up done for elevated liver function tests. She had a CT chest with contrast done which showed atelectasis versus fibrosis versus possible infiltrate at the lung bases, suspicion of portal hypertension and noncalcified gallstones and there was also a 2.46 cm x 2.5 cm hyperenhancing mass in the anterior base of the thyroid. Patient also had an ultrasound of the abdomen done which showed thickened gallbladder wall, findings which represent cholesterolosis, diffuse thickening of the gallbladder wall as was sequelae of adenomatous cholecystitis. She also had an MRI of the abdomen done which showed gallstones and thickening of the gallbladder but patient did not have any abdominal pain, or any abdominal tenderness so this is most likely not cholecystitis. Dr. Pat Patrick saw her as a surgical consultant and said no  surgical indications at this time. Findings are most likely hepatic. Serology was also sent on the patient. She had hepatitis A antibody that was positive so she is hepatitis A immune. Hepatitis B surface antibody that was positive so she is hepatitis B immune, hepatitis B surface antigen negative. Hepatitis C virus antibody negative. Antinuclear antibody positive. Antimitochondrial antibody negative. Anti-smooth muscle antibody negative. Alpha-1 antitrypsin normal and she had acute hepatitis profile negative. Hepatitis C was negative. Her liver function has improved. Her bilirubin has come down to 1.6, alkaline phosphatase 263, ALT is normal at 77 and AST is 53. She needs to follow up with GI as an outpatient. Because of her presumed pneumonia she was also started on Zosyn at admission but her CT chest which actually showed more atelectasis than infiltrate. She started having diarrhea and her Zosyn was discontinued. She was also having some diarrhea during the hospital stay and her stool studies are all negative. Stool for WBCs was negative. Stool for C. difficile was negative. She also had elevated white count when she came in to 12,000 but it improved the next day. Her urinalysis was negative. She had some episodes of nonsustained V. tach during the hospital stay. She was also hypokalemic and hypomagnesemic. We have replaced her magnesium aggressively, given her 2 grams of magnesium sulfate at discharge. I am going to discharge her on magnesium oxide for five days. Also she takes a potassium pill every day, which she should continue. Her potassium is normal today at 3.8. Will try to keep the potassium above 4. We also started her on low dose beta blocker which I am going to continue. Cardiology  saw her and said possibly this nonsustained ventricular tachycardia asymptomatic secondary to electrolyte abnormalities. Check a TSH which is normal and patient may need an outpatient stress test so we are going to refer  her to cardiology as outpatient. She also developed acute renal failure during the hospital stay. Her creatinine went up to 2.14 with a BUN of 32, maybe because of hypotension and ATN and that has improved now. Her creatinine has improved to 0.95. For her hypertension I have increased her amlodipine to 5 mg daily and she is on low dose beta blocker. I have decresaed  her losartan at this time and also changed her naproxen to as needed for pain. Her ANA was positive. GI needs to follow up as outpatient when she follows with them. Her TSH is normal at 0.637. Patient had echo done which is pending at this time. Will follow the echo results before discharge.    MEDICATIONS AT DISCHARGE:  1. Aspirin 81 mg daily.  2. Potassium chloride 20 mEq daily.  3. Hydroxyurea 500 mg 3 capsules every other day alternating with 2 capsules every other day.  4. Naproxen 500 mg once a day as needed for joint pains.  5. Change amlodipine to 5 mg p.o. once daily. 6. Metoprolol 25 mg p.o. b.i.d.  7. Magnesium oxide 400 mg p.o. b.i.d. for five days.  8 Losartan 50 mg po once daily  CONDITION AT DISCHARGE: She is comfortable. Tolerating diet. Diarrhea has resolved. No chest pain. No shortness of breath. T-max 98.8 to 99.5, heart rate 65, blood pressure 131/67, saturating 100% on room air. Chest is clear. Heart sounds are regular. Abdomen soft, nontender. She has some lower extremity edema.   FOLLOW UP: Patient should follow up with Dr. Candace Cruise at [place] in 1 to 2 weeks. Follow up with Dr. Fletcher Anon, [facility], in 1 to 2 weeks for follow up on outpatient stress test. Follow up with M.D. at short-term rehab, Dr. Darnell Level at [facility], in one week. Follow-up BMP, LFTs and serum magnesium level in one week. Her thyroid mass needs to be followed up as outpatient. She had a thyroid workup in the past but this needs to be followed up.   TIME SPENT WITH DISCHARGE: 60 minutes.   ____________________________ Mena Pauls,  MD ag:cms D: 02/25/2012 12:12:36 ET T: 02/25/2012 12:28:09 ET JOB#: [id number]  cc: Mena Pauls, MD, <Dictator> [place] Candace Cruise, MD Mertie Clause Fletcher Anon, MD [place], MD Mena Pauls MD ELECTRONICALLY SIGNED 03/04/2012 11:27

## 2015-03-04 NOTE — Consult Note (Signed)
PATIENT NAME:  Yolanda Wells, Yolanda Wells MR#:  [id number] DATE OF BIRTH:  1932-05-28  DATE OF CONSULTATION:  02/20/2012  REFERRING PHYSICIAN:  Dr. Inez Catalina  CONSULTING PHYSICIAN:  Payton Emerald, NP/Dr. Verdie Shire   PRIMARY CARE PHYSICIAN: Dr. Harrell Gave with [facility]   REASON FOR CONSULTATION: Elevation in transaminase levels.   HISTORY OF PRESENT ILLNESS: Yolanda Wells is a 81 year old African American female with significant medical history of hypertension, polycythemia vera as well as right lower extremity deep vein thrombosis who presented to the Emergency Room for onset of weakness. Tuesday evening she states that she ate salad around 6:00 p.m. and around midnight she became very nauseated, ended up vomiting and vomiting continued throughout the night. Then she states on Wednesday she was feeling better but she had presented to senior center where her she was found by her friends to be dosing off throughout their activities which was very unusual for her. Yesterday during rehab she was unable to proceed with therapy. Therapist noted that this was unusual, checked her blood pressure, was found to be hypotensive with a blood pressure in the range of 87/62. Thus she was recommended to proceed to the Emergency Room for additional evaluation. She has had no associated abdominal pain. Up until the onset of nausea and vomiting on Tuesday she was doing normal activities of daily living. She resides at [facility]. No similar episodes to this in the past. She does note that two weeks ago that they were on isolation at [facility] for the concern of Norovirus. No nausea, no new medications. Bowel pattern normally is every other day. Denies any diarrhea. No blood. No melena. No heartburn, indigestion. Appetite has been good. Weight is stable. In fact patient had just eaten a regular tray prior to presenting to her room to interview.   PAST MEDICAL HISTORY:  1. Hypertension.  2. Polycythemia vera.  3. Deep venous  thrombosis of right lower extremity.  4. Diverticulosis.    PAST SURGICAL HISTORY:  1. Bilateral hip replacement. 2. 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 3. Status post hip surgery in 1998.  4. Hysterectomy.   ALLERGIES: None.   MEDICATIONS:  1. Amlodipine 2.5 mg daily. 2. Aspirin 81 mg a day.  3. Hydroxyurea 1500 mg alternating with 100 mg daily.  4. Losartan 100 mg a day. 5. Naproxen 500 mg a day.  6. Potassium chloride 20 mEq daily.   FAMILY HISTORY: Two sons history of cancer, one with prostate, other with multiple myeloma. Father deceased from myocardial infarction at the age of 4. Mother had a brain tumor. No colon cancer.   SOCIAL HISTORY: Resides at [facility]. No tobacco or alcohol or recreational drug use.   REVIEW OF SYSTEMS: CONSTITUTIONAL: No fevers. Significant for generalized weakness. EYES: No glaucoma, cataracts. ENT: No epistaxis or discharge. RESPIRATORY: Productive cough over the last couple of days, greenish color sputum. CARDIOVASCULAR: No chest pain, edema, heart palpitations, syncope, orthopnea. GASTROINTESTINAL: See history of present illness. GENITOURINARY: No dysuria, hematuria. ENDOCRINE: No polyuria or polydipsia though she does say that possibly she has enlarged thyroid in the past. MUSCULOSKELETAL: No joint swelling. NEUROLOGICAL: No history of cerebrovascular accident or transient ischemic attack. PSYCH: No depression. No anxiety.   PHYSICAL EXAMINATION:  VITAL SIGNS: Temperature 97.6, pulse 74, respirations 20, blood pressure 125/82, pulse oximetry 96%.   GENERAL: Well developed, overweight 81 year old [place] American female, no acute distress noted, pleasant. Granddaughter did arrive during  interviewing process present.   HEENT: Normocephalic, atraumatic. Pupils equal, reactive to light. Conjunctivae clear.   NECK: Supple. Trachea midline. No lymphadenopathy, thyromegaly.    PULMONARY: Symmetric rise and fall of chest. Clear to auscultation throughout.    CARDIOVASCULAR: Regular rhythm, S1, S2. No murmurs, no gallops.   ABDOMEN: Soft, nondistended. Bowel sounds in four quadrants. No bruits. No masses. No evidence of hepatosplenomegaly.   RECTAL: Deferred.   MUSCULOSKELETAL: Gait not assessed. Moving all four extremities. No contractures. No clubbing.   EXTREMITIES: No edema.   PSYCH: Alert and oriented x4. Memory grossly intact. Appropriate affect and mood.   NEUROLOGIC: No gross neurological deficits.   SKIN: Warm, dry. No lesions. No rashes.   LABORATORY, DIAGNOSTIC, AND RADIOLOGICAL DATA: Chemistry panel on admission: BUN is elevated at 21, potassium 3.3, calcium 8.8, otherwise within normal limits. Comparison today's date BUN elevated at 28, creatinine rose from 0.90 to 1.62, potassium declined to 2.9, EGFR less than 35, calcium declined to 8.3, triglycerides 61, cholesterol 64, magnesium 2.0. Hemoglobin A1c 5.9 and HDL was low at 10. Total protein on admission 7.5 with an albumin of 2.9, total bilirubin 6.9, alkaline phosphatase 150 which is normalized to 127 at 4:35 today. AST 230, ALT 279, improved to 111 and 184 respectively. Total bilirubin declined to 5.2 and albumin declined to 2.4. CK total three in series within normal limits. WBC count on admission was 12.0, platelet count was elevated at 585, MCV elevated at 110 with an MCH of 35.9 and RDW 17.4. White count is improved to 8.8. Hemoglobin declined from 14.7 to 13.8, hematocrit 44.8 to 42.3 and platelet count improved but still elevated at 492. PT 18.6 with an INR of 1.5. Urinalysis: RBCs 2 per high-power field, WBCs 3 per high-power field, epithelial cells 3 per high-power field. CT of chest with contrast revealed thyroid mass which was noted. Thoracic inlet demonstrates a 2.46 x 2.5 cm hypo-enhancing mass in the anterior base of the thyroid. Peripheral calcification was identified within the mass.  Atelectasis versus fibrosis versus possible infiltrates at the lung bases. Suspicion has been raised of portal hypertension, likely cyst left kidney, noncalcified gallstones and gallbladder wall enhancement noted. Similar findings as well on abdominal ultrasound which revealed multiple gallstones in the gallbladder. No evidence of Murphy's sign. Diffuse gallbladder wall thickening at 6.5 mm. Multiple echogenic foci identified within the gallbladder demonstrating reverberation artifact. Common bile duct was 5.4 mm. Chest single view concerning for right middle lobe infiltrate versus atelectasis. No mass noted. Findings also concerning for infiltrates within left the lingula and left lower lobe region.   IMPRESSION: Admitted after acute onset of nausea and vomiting, fatigue, hypotension, elevation in liver enzymes, abdominal ultrasound finding of gallstones as well as gallbladder wall thickening, thyroid mass.   PLAN: Patient's presentation is discussed with Dr. Verdie Shire. Numerous laboratory studies were ordered to aid in differential diagnoses. See laboratory studies ordered for today's date which is inclusive of hepatic evaluation as well as possible autoimmune process. Possible acute hepatitis versus chronic, possible autoimmune concern. MRI of abdomen with and without was ordered for tomorrow to allow further evaluation of hepatobiliary system. Concern for possible malignancy versus elevation and no abdominal pain being experience by patient; n.p.o. as directed prior to study. Will continue to monitor patient's status. No endoscopic evaluation is recommended at this point in time. Will await laboratory results as well as MRI of abdomen and pelvis with and without contrast results from tomorrow.   These services were provided  by myself under collaborative agreement with Dr. Verdie Shire.  Thank you for allowing [place] to participate in the care of [facility].  ____________________________ Payton Emerald,  NP dsh:cms D: 02/20/2012 17:56:35 ET T: 02/21/2012 14:55:01 ET JOB#: [id number]  cc: Payton Emerald, NP, <Dictator> Payton Emerald MD ELECTRONICALLY SIGNED 02/25/2012 15:30

## 2015-03-04 NOTE — Consult Note (Signed)
PATIENT NAME:  Yolanda Wells, Yolanda Wells MR#:  [id number] DATE OF BIRTH:  18-Oct-1932  DATE OF CONSULTATION:  02/24/2012  REFERRING PHYSICIAN:  Mena Pauls, MD  CONSULTING PHYSICIAN:  Muhammad A. Fletcher Anon, MD   REASON FOR CONSULTATION: Nonsustained ventricular tachycardia.   HISTORY OF PRESENT ILLNESS: This is a 81 year old pleasant female with history of hypertension, polycythemia vera, and right lower extremity DVT. She presented on the 11th with vomiting and weakness. She was found to have pneumonia as well as acute renal failure and hypotension. She was also found to have electrolyte abnormalities and elevated liver enzymes. She improved gradually with IV hydration and antibiotics. Last night she was noted to have brief runs of nonsustained ventricular tachycardia, the longest of which was seven beats. The patient was completely asymptomatic. She denies any chest pain or dyspnea. She is actually feeling better. She is not aware of any previous cardiac history. She denies any previous history of syncope. Her magnesium was 1.5 which is being replaced. Potassium was 3.1 which was also replaced.   PAST MEDICAL HISTORY:  1. Hypertension.  2. Polycythemia vera.  3. Deep venous thrombosis of the right lower extremity.   ALLERGIES: No known drug allergies.   HOME MEDICATIONS:  1. Amlodipine 2.5 mg once daily.  2. Aspirin 81 mg daily.  3. Hydroxyurea.  4. Losartan 100 mg once daily.  5. Naproxen 500 mg as needed.  6. Potassium chloride 20 mEq once daily.   FAMILY HISTORY: Negative for premature coronary artery disease.   SOCIAL HISTORY: Negative for smoking, alcohol, or drug use.   REVIEW OF SYSTEMS: 10 point review of systems was performed. It is negative other than what is mentioned in the history of present illness.   PHYSICAL EXAMINATION:   GENERAL: The patient appears to be at her stated age in no acute distress.   VITAL SIGNS: Temperature 98.9, pulse 66, respiratory rate 20, blood pressure  166/79, oxygen saturation 98% on 2 liters nasal cannula.   HEENT: Normocephalic, atraumatic.   NECK: No JVD or carotid bruits.   RESPIRATORY: Normal respiratory effort with no use of accessory muscles. Auscultation reveals normal breath sounds.   CARDIOVASCULAR: Normal PMI. Normal S1, S2 with no gallops or murmurs.   ABDOMEN: Benign, nontender, and nondistended.   EXTREMITIES: Trace edema bilaterally.   SKIN: Warm and dry with no rash.   PSYCHIATRIC: She is alert, oriented x3 with normal mood and affect.   LABORATORY AND DIAGNOSTIC DATA: ECG showed normal sinus rhythm with right bundle branch block and left anterior fascicular block. Her troponin on presentation was 0.04 with normal CPK. Her labs showed improvement in renal function with a creatinine of 0.98, magnesium 1.5, and potassium of 3.9 which was earlier 3.1.   IMPRESSION:  1. Asymptomatic nonsustained ventricular tachycardia.  2. Electrolyte abnormalities with hypomagnesemia and hypokalemia.  3. Hypertension.  4. Recent acute renal failure and pneumonia.   RECOMMENDATIONS: The patient had a few runs of wide complex tachycardia with slight change in QRS axis. This is likely due to ventricular tachycardia. The other possibility is atrial tachycardia with aberrancy. I suspect that the events were related to electrolyte abnormalities with low magnesium and potassium. I agree with replacing both and trying to keep potassium above 4 and magnesium above 2. Her thyroid function was checked early during her admission and actually her TSH was normal. An echocardiogram was ordered and will be reviewed. I agree with starting small dose metoprolol for now. If her ejection fraction is normal and echocardiogram,  likely no further investigation is needed. However, a stress test can be considered at some point in the outpatient setting to evaluate for possible underlying ischemia.   ____________________________ Mertie Clause. Fletcher Anon,  MD maa:drc D: 02/24/2012 19:19:10 ET T: 02/25/2012 10:13:32 ET JOB#: [id number]  cc: Muhammad A. Fletcher Anon, MD, <Dictator> [place] MD ELECTRONICALLY SIGNED 02/28/2012 14:10

## 2015-03-04 NOTE — Consult Note (Signed)
Chief Complaint:   Subjective/Chief Complaint Some diarrhea this AM. LFT continues to improve. Renal function improving as well. Hep A/B/C neg.   VITAL SIGNS/ANCILLARY NOTES: **Vital Signs.:   14-Apr-13 08:55   Vital Signs Type Routine   Temperature Temperature (F) 98.3   Celsius 36.8   Temperature Source tympanic   Pulse Pulse 71   Respirations Respirations 18   Systolic BP Systolic BP 419   Diastolic BP (mmHg) Diastolic BP (mmHg) 68   Mean BP 84   Pulse Ox % Pulse Ox % 97   Pulse Ox Activity Level  At rest   Oxygen Delivery Room Air/ 21 %   Brief Assessment:   Cardiac Regular    Respiratory clear BS    Gastrointestinal Normal     Routine Chem:  14-Apr-13 05:18    Glucose, Serum 73   BUN 22   Creatinine (comp) 1.58   Sodium, Serum 140   Potassium, Serum 3.5   Chloride, Serum 108   CO2, Serum 21   Calcium (Total), Serum 7.7  Hepatic:  14-Apr-13 05:18    Bilirubin, Total 2.9   Alkaline Phosphatase 180   SGPT (ALT) 91   SGOT (AST) 37   Total Protein, Serum 6.1   Albumin, Serum 2.1  Routine Chem:  14-Apr-13 05:18    Osmolality (calc) 281   eGFR (African American) 36   eGFR (Non-African American) 31   Anion Gap 11   Assessment/Plan:  Assessment/Plan:   Assessment Suspect viral infection affecting GI and liver. Clinically improving.    Plan Continue supportive care. Will advance diet to full liquid   Electronic Signatures: Verdie Shire (MD)  (Signed 14-Apr-13 09:34)  Authored: Chief Complaint, VITAL SIGNS/ANCILLARY NOTES, Brief Assessment, Lab Results, Assessment/Plan   Last Updated: 14-Apr-13 09:34 by Verdie Shire (MD)

## 2015-03-04 NOTE — Consult Note (Signed)
Brief Consult Note: Diagnosis: Admitted after acute onset of nausea and vomiting.  Fatigue and hypotension.  Elevation of liver enzymes.  Abdominal ultrasound finding of gallstones as well as gallbladder wall thickening.   Consult note dictated.   Recommend further assessment or treatment.   Discussed with Attending MD.   Comments: Patient's presentation discussed with Dr. Verdie Shire. Numerous laboratory studies ordered to aid in differential diagnoses.  See laboratory studies ordered with today's date.  Possible acute hepatitis vs chronic.  Possible autoimmune concern. MR of abdomen with and without ordered for tomorrow.  Study ordered to allow further evaulation of hepatobiliary systems.  Concern with possible malignancy given elevation and no abdominal pain.  NPO as directed prior to study.  Will continue to monitor.  No endoscopic evaluation at this time.  Electronic Signatures: Payton Emerald (NP)  (Signed 12-Apr-13 17:56)  Authored: Brief Consult Note   Last Updated: 12-Apr-13 17:56 by Payton Emerald (NP)

## 2015-03-04 NOTE — Discharge Summary (Signed)
PATIENT NAME:  Yolanda Wells, Yolanda Wells MR#:  [id number] DATE OF BIRTH:  09-24-1932  DATE OF ADMISSION:  02/19/2012 DATE OF DISCHARGE:  02/25/2012  ADDENDUM: Her echo came back. Her EF is greater than 55%, some impaired relaxation, right ventricular function is normal, and right ventricular pressure is elevated at 30 to 40 mmHg. So her EF is normal. We will discharge her with low dose beta blocker and some angiotensin receptor blocker. She can followup with cardiology as an outpatient. Also, on her CT scan of the chest when she came in, she had a small thyroid mass in the range of 2.46 cm, hyper-enhancing mass in the anterior base of the thyroid. Her TSH is normal. The patient says she has been told about it in the past and had workup in the past. I also informed the granddaughter at the time of discharge. This needs to be followed up as an outpatient with her primary care physician, with Dr. Darnell Level, at [facility].  ____________________________ Mena Pauls, MD ag:slb D: 02/25/2012 13:19:59 ET T: 02/25/2012 13:34:24 ET JOB#: [id number]  cc: Mena Pauls, MD, <Dictator> Yolanda Neighbours, MD Mena Pauls MD ELECTRONICALLY SIGNED 03/04/2012 11:28

## 2015-03-04 NOTE — H&P (Signed)
PATIENT NAME:  Yolanda Wells, Yolanda Wells MR#:  [id number] DATE OF BIRTH:  1932-10-30  DATE OF ADMISSION:  02/19/2012  REFERRING PHYSICIAN: Francene Castle, MD  PRIMARY CARE PHYSICIAN: Lovena Neighbours, MD - [facility]  PRESENTING COMPLAINT: Weakness, nausea, vomiting, and poor p.o. intake.   HISTORY OF PRESENT ILLNESS: Ms. Gasner is a pleasant 81 year old woman with history of hypertension, polycythemia vera, and history of right lower extremity deep venous thrombosis who presents with complaints of developing acute onset of weakness today. She reports that three nights ago she started having nausea and vomiting. It lasted all night long but subsided by the following day. She had not had any p.o. intake the following day and when she presented to physical therapy today she was having weakness and feeling short winded from her baseline. They noted that her blood pressure was low, documented at 87/62. She is receiving physical therapy for balance related to her hips and also right toe amputation. She denies any chest pain or shortness of breath. She reports cough with productive green mucus for the past 2 to 3 days. She denies any fevers or chills. No sick contact. No presyncope or syncope.   PAST MEDICAL HISTORY:  1. Hypertension.  2. Polycythemia vera.  3. Deep venous thrombosis of the right lower extremity.   PAST SURGICAL HISTORY:  1. Bilateral hip replacement.  2. 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  3. Hysterectomy.   ALLERGIES: No known drug allergies.   MEDICATIONS:  1. Amlodipine 2.5 mg daily.  2. Aspirin 81 mg daily.  3. Hydroxyurea 1500 mg alternating with 1000 mg daily.  4. Losartan 100 mg daily.  5. Naproxen 500 mg daily.  6. Potassium chloride 20 mEq daily.   FAMILY HISTORY: Two of her sons had cancer, one with prostate and the other with multiple myeloma. Father died of a myocardial  infarction at age 25. Her mother had a brain tumor.   SOCIAL HISTORY: She lives in [place] in a [place]. No tobacco, alcohol or drug use.  REVIEW OF SYSTEMS: CONSTITUTIONAL: No fevers. She endorses generalized weakness. EYES: No glaucoma or cataracts. ENT: No epistaxis or discharge. Denies any dysphagia. RESPIRATORY: She endorses productive cough. CARDIOVASCULAR: No chest pain, orthopnea, edema, palpitations, or syncope. GI: Endorses nausea and vomiting x1 day. No diarrhea, abdominal pain, hematemesis, or melena. GU: No dysuria or hematuria. ENDOCRINE: No polyuria or polydipsia. Reportedly told that she has had an enlarged thyroid in the past and has had a complete work-up, according to patient. MUSCULOSKELETAL: No joint swelling. NEUROLOGIC: No one-sided weakness or numbness. She is weak all over. PSYCHIATRIC: Denies any suicidal ideation.   PHYSICAL EXAMINATION:   VITAL SIGNS: Temperature 98.5, pulse 88, respiratory rate 20, blood pressure 108/63, and saturating at 98% on room air.   GENERAL: Lying in bed in no apparent distress.   HEENT: Normocephalic, atraumatic. Pupils equal and symmetric, anicteric. Nares without discharge. Moist mucous membranes.   NECK: Soft and supple. No adenopathy or JVP. Cannot palpate a thyroid nodule or mass.   ABDOMEN: Soft. Positive bowel sounds. No mass appreciated. The liver does not appear enlarged. No splenomegaly.  EXTREMITIES: No edema. She is status post amputations of all her digits on the right lower extremity. Dorsal pedis pulses intact.  NEUROLOGIC: No dysarthria or aphasia. Symmetrical strength. No focal deficits.   PSYCH: She is alert and oriented. The patient is cooperative.   PERTINENT LABS AND  STUDIES: Ultrasound of the abdomen showed a thickened gallbladder wall. There are findings which appear to represent cholesterolosis. The diffuse thickening of the gallbladder wall as well as these findings may represent the sequela of  adenomyomatosis cholecystitis which cannot be excluded. Surgical consultation recommended if clinically warranted.   Urinalysis with specific gravity of 1.013, pH 6, RBC 2 per high-power field, and WBC 3 per high-power field.   CT of the chest with contrast is revealing for a thyroid mass and atelectasis versus fibrosis versus possibly infiltrates at lung bases. There are findings which raise suspicion of portal hypertension. There is likely a cyst of the left kidney and there is noncalcified gallstones and gallbladder wall enhancement that is nonspecific.   Glucose 93, BUN 21, creatinine 0.9, sodium 136, potassium 3.3, chloride 101, carbon dioxide 25, calcium 8.8, total bilirubin 6.9, alkaline phosphatase 150, ALT 239, AST 230, total protein 7.5, and albumin 2.9. WBC 12, hemoglobin 14.7, hematocrit 44.8, platelets 85, and MCV 110. Troponin  0.04.   ASSESSMENT AND PLAN: Ms. Vice is a pleasant 81 year old woman with history of hypertension, polycythemia vera, and right lower extremity deep venous thrombosis presenting with complaints of nausea, vomiting, cough and weakness.  1. Presumed pneumonia, bilateral basilar infiltrate with CT scan findings and reports of productive cough. Questionable aspiration status post vomiting episode three days ago. We will start her on clindamycin, continue oxygen as needed and SVNs as needed.  2. Transaminitis, likely in the setting of gallstones. Given no clinical findings of abdominal pain or ongoing nausea and vomiting, low suspicion for cholecystitis. Also noted possible portal hypertension. We will send hepatitis panel. We will obtain gastroenterology consultation. The patient and family are not interested in surgical evaluation at this time. Continue to follow and reassess if interested in consultation.  3. Transient hypotension, possibly with hypovolemia given the patient has not been eating and also has had nausea and vomiting. Her troponin is on the higher end  of normal. We will continue to cycle cardiac enzymes, continue on telemetry, hold blood pressure medications for now, and resume her aspirin.  4. Hypokalemia. Send magnesium level, increase her supplements. Likely in the setting of poor p.o. intake.  5. Weakness likely in the setting of the above presentation.  6. Thyroid mass on CT scan. Per the patient, she has been told she has an enlarged thyroid with complete work-up. This may not be a new finding. We will send TSH. She will need follow-up post discharge.  7. Polycythemia vera. Resume her hydroxyurea.  8. Prophylaxis with aspirin, Protonix, and Lovenox.   TIME SPENT: Approximately 50 minutes was spent on patient care.  ____________________________ Rita Ohara, MD ap:slb D: 02/19/2012 23:36:21 ET T: 02/20/2012 07:32:12 ET JOB#: [id number]  cc: Brien Few Lafaye Mcelmurry, MD, <Dictator> Lovena Neighbours, MD Rita Ohara MD ELECTRONICALLY SIGNED 03/07/2012 2:03

## 2015-03-22 ENCOUNTER — Emergency Department: Payer: Medicare Other

## 2015-03-22 ENCOUNTER — Emergency Department
Admission: EM | Admit: 2015-03-22 | Discharge: 2015-03-22 | Disposition: A | Discharge status: Home / Self Care | Payer: Medicare Other | Attending: Emergency Medicine | Admitting: Emergency Medicine

## 2015-03-22 ENCOUNTER — Other Ambulatory Visit: Payer: Self-pay

## 2015-03-22 ENCOUNTER — Encounter: Payer: Self-pay | Admitting: General Practice

## 2015-03-22 DIAGNOSIS — N390 Urinary tract infection, site not specified: Secondary | ICD-10-CM | POA: Diagnosis not present

## 2015-03-22 DIAGNOSIS — I10 Essential (primary) hypertension: Secondary | ICD-10-CM | POA: Diagnosis not present

## 2015-03-22 DIAGNOSIS — Z79899 Other long term (current) drug therapy: Secondary | ICD-10-CM | POA: Insufficient documentation

## 2015-03-22 DIAGNOSIS — Z7982 Long term (current) use of aspirin: Secondary | ICD-10-CM | POA: Insufficient documentation

## 2015-03-22 DIAGNOSIS — R109 Unspecified abdominal pain: Secondary | ICD-10-CM | POA: Diagnosis present

## 2015-03-22 LAB — COMPREHENSIVE METABOLIC PANEL
ALT: 7 U/L — ABNORMAL LOW (ref 14–54)
ANION GAP: 7 (ref 5–15)
AST: 18 U/L (ref 15–41)
Albumin: 3.5 g/dL (ref 3.5–5.0)
Alkaline Phosphatase: 77 U/L (ref 38–126)
BUN: 12 mg/dL (ref 6–20)
CALCIUM: 9 mg/dL (ref 8.9–10.3)
CO2: 24 mmol/L (ref 22–32)
Chloride: 107 mmol/L (ref 101–111)
Creatinine, Ser: 0.52 mg/dL (ref 0.44–1.00)
GFR calc non Af Amer: >60 mL/min (ref >60)
GLUCOSE: 93 mg/dL (ref 65–99)
Potassium: 4.7 mmol/L (ref 3.5–5.1)
Sodium: 138 mmol/L (ref 135–145)
Total Bilirubin: 0.8 mg/dL (ref 0.3–1.2)
Total Protein: 7.3 g/dL (ref 6.5–8.1)

## 2015-03-22 LAB — CBC WITH DIFFERENTIAL/PLATELET
Basophils Absolute: 0 10*3/uL (ref 0–0.1)
EOS ABS: 0.1 10*3/uL (ref 0–0.7)
Eosinophils Relative: 2 %
HEMATOCRIT: 45.7 % (ref 35.0–47.0)
HEMOGLOBIN: 14.3 g/dL (ref 12.0–16.0)
Lymphocytes Relative: 13 %
Lymphs Abs: 0.8 10*3/uL — ABNORMAL LOW (ref 1.0–3.6)
MCH: 33.3 pg (ref 26.0–34.0)
MCHC: 31.2 g/dL — AB (ref 32.0–36.0)
MCV: 106.5 fL — ABNORMAL HIGH (ref 80.0–100.0)
Monocytes Absolute: 0.3 10*3/uL (ref 0.2–0.9)
NEUTROS ABS: 4.9 10*3/uL (ref 1.4–6.5)
Neutrophils Relative %: 79 %
Platelets: 1041 10*3/uL (ref 150–440)
RBC: 4.29 MIL/uL (ref 3.80–5.20)
RDW: 16.9 % — ABNORMAL HIGH (ref 11.5–14.5)
WBC: 6.1 10*3/uL (ref 3.6–11.0)

## 2015-03-22 LAB — URINALYSIS COMPLETE WITH MICROSCOPIC (ARMC ONLY)
Bilirubin Urine: NEGATIVE
GLUCOSE, UA: NEGATIVE mg/dL
Hgb urine dipstick: NEGATIVE
KETONES UR: NEGATIVE mg/dL
Nitrite: POSITIVE — AB
PH: 6 (ref 5.0–8.0)
Protein, ur: NEGATIVE mg/dL
Specific Gravity, Urine: 1.008 (ref 1.005–1.030)

## 2015-03-22 LAB — LIPASE, BLOOD: Lipase: 22 U/L (ref 22–51)

## 2015-03-22 LAB — TROPONIN I: Troponin I: <0.03 ng/mL (ref <0.031)

## 2015-03-22 MED ORDER — ONDANSETRON HCL 4 MG/2ML IJ SOLN
INTRAMUSCULAR | Status: AC
  Administered 2015-03-22: 4 mg via INTRAVENOUS
  Filled 2015-03-22: qty 2

## 2015-03-22 MED ORDER — CEFTRIAXONE SODIUM 1 G IJ SOLR
1.0000 g | Freq: Once | INTRAMUSCULAR | Status: AC
  Administered 2015-03-22: 1 g via INTRAVENOUS

## 2015-03-22 MED ORDER — MORPHINE SULFATE 2 MG/ML IJ SOLN
INTRAMUSCULAR | Status: AC
  Administered 2015-03-22: 2 mg via INTRAVENOUS
  Filled 2015-03-22: qty 1

## 2015-03-22 MED ORDER — DEXTROSE 5 % IV SOLN
INTRAVENOUS | Status: AC
  Filled 2015-03-22: qty 10

## 2015-03-22 MED ORDER — ONDANSETRON HCL 4 MG/2ML IJ SOLN
4.0000 mg | Freq: Once | INTRAMUSCULAR | Status: AC
Start: 2015-03-22 — End: 2015-03-22
  Administered 2015-03-22: 4 mg via INTRAVENOUS

## 2015-03-22 MED ORDER — LEVOFLOXACIN 750 MG PO TABS: 750.0000 mg | ORAL_TABLET | Freq: Every day | ORAL | Status: AC

## 2015-03-22 MED ORDER — MORPHINE SULFATE 2 MG/ML IJ SOLN
2.0000 mg | Freq: Once | INTRAMUSCULAR | Status: AC
  Administered 2015-03-22: 2 mg via INTRAVENOUS

## 2015-03-22 NOTE — ED Provider Notes (Signed)
[facility] Emergency Department Provider Note   ____________________________________________  Time seen: On EMS arrival 3:55 PM  I have reviewed the triage vital signs and the triage nursing note.   HISTORY  Chief Complaint Abdominal Pain    HPI Yolanda Wells is a 81 y.o. female coming in from home with a complaint of 2 days of abdominal pain located in the left lower quadrant. States it 6 out of 10. She's had some nausea but no vomiting no diarrhea no black or bloody stools. She simply calls it pain. She's had this before and did not have a diagnosis. She had an episode last night which then resolved and she had no pain overnight but upon this morning she started having it again it hasn't gone away all day.      Past Medical History  Diagnosis Date  . Hypertension   . Polycythemia vera(238.4)   . Deep venous thrombosis     right lower extremity  . History of hysterectomy   . History of bilateral hip replacements     There are no active problems to display for this patient.   Past Surgical History  Procedure Laterality Date  . Toe amputation      right    Current Outpatient Rx  Name  Route  Sig  Dispense  Refill  . amLODipine (NORVASC) 2.5 MG tablet   Oral   Take 2.5 mg by mouth daily.         [name] aspirin 81 MG tablet   Oral   Take 81 mg by mouth daily.         [name] HYDROXYUREA PO   Oral   Take 1,500 mg by mouth. Takes 1500 mg alternating with 1000 mg daily.         [name] losartan (COZAAR) 100 MG tablet   Oral   Take 100 mg by mouth daily.         . naproxen (NAPROSYN) 500 MG tablet   Oral   Take 500 mg by mouth daily.         . potassium chloride (KLOR-CON) 20 MEQ packet   Oral   Take 20 mEq by mouth daily.           Allergies Review of patient's allergies indicates no known allergies.  Family History  Problem Relation Age of Onset  . Heart attack Father     Social History History  Substance Use Topics  .  Smoking status: Never Smoker   . Smokeless tobacco: Not on file  . Alcohol Use: No    Review of Systems  Constitutional: Negative for fever. Eyes: Negative for visual changes. ENT: Negative for sore throat. Cardiovascular: Negative for chest pain. Respiratory: Negative for shortness of breath. Gastrointestinal: No black or bloody stools Genitourinary: Negative for dysuria. Musculoskeletal: Negative for back pain. Skin: Negative for rash. Neurological: Negative for headaches, focal weakness or numbness.   ____________________________________________   PHYSICAL EXAM:  VITAL SIGNS: ED Triage Vitals  Enc Vitals Group     BP 03/22/15 1551 152/77 mmHg     Pulse Rate 03/22/15 1551 81     Resp 03/22/15 1551 118     Temp 03/22/15 1551 98.5 F (36.9 C)     Temp Source 03/22/15 1551 Oral     SpO2 03/22/15 1551 97 %     Weight 03/22/15 1551 240 lb (108.863 kg)     Height 03/22/15 1551 5\' 8"  (1.727 m)     Head Cir --  Peak Flow --      Pain Score 03/22/15 1551 8     Pain Loc --      Pain Edu? --      Excl. in [place]? --      Constitutional: Alert and oriented. Well appearing and in no distress. Eyes: Conjunctivae are normal. PERRL. Normal extraocular movements. ENT   Head: Normocephalic and atraumatic.   Nose: No congestion/rhinnorhea.   Mouth/Throat: Mucous membranes are moist.   Neck: No stridor. Cardiovascular: Normal rate, regular rhythm.  No murmurs, rubs, or gallops. Respiratory: Normal respiratory effort without tachypnea nor retractions. Breath sounds are clear and equal bilaterally. No wheezes/rales/rhonchi. Gastrointestinal: Soft with no guarding or rebound but mild to moderate tenderness in the left lower quadrant. No distention.  Genitourinary: Musculoskeletal: Nontender with normal range of motion in all extremities. No joint effusions.  No lower extremity tenderness nor edema. No hip pain Neurologic:  Normal speech and language. No gross focal  neurologic deficits are appreciated. Speech is normal. No gait instability. Skin:  Skin is warm, dry and intact. No rash noted. Psychiatric: Mood and affect are normal. Speech and behavior are normal. Patient exhibits appropriate insight and judgment.  ____________________________________________   EKG   84 bpm. Sinus rhythm with occasional PVC. Left axis deviation. Right bundle branch block. Normal ST and T-wave.  ____________________________________________   LABS (pertinent positives/negatives)   Left joints within normal limits Troponin and lipase negative CBC showing a normal white blood cell count only abnormal for platelets of 1041 Urinalysis showing many bacteria 60-30 white blood and trace leukocytes ____________________________________________    RADIOLOGY Radiologist results reviewed  Acute abdomen with one view chest: No acute abnormality  ____________________________________________   PROCEDURES  Procedure(s) performed:  Critical Care performed:   ____________________________________________   INITIAL IMPRESSION / ASSESSMENT AND PLAN / ED COURSE  Pertinent labs & imaging results that were available during my care of the patient were reviewed by me and considered in my medical decision making (see chart for details).  The patient's physical examination blood work are reassuring with stable vital signs. Her urinalysis shows consistent with urinary tract infection and she'll be treated with initial dose of IV antibiotics and she is stable for treatment at home. Return precautions and discharge instructions were provided to patient  Patient failure number is a little concerned about still possibly diverticulitis. On reevaluation patient doesn't really have any point tenderness when I push on her abdomen left lower quadrant however I did discuss with the family that she started already Levaquin and if she does not get that her as expected and next 2-3 days to  have her rechecked. ____________________________________________   FINAL CLINICAL IMPRESSION(S) / ED DIAGNOSES  Acute urinary tract infection     Lisa Roca, MD 03/22/15 [phone]

## 2015-03-22 NOTE — Discharge Instructions (Signed)
°  Return to the emergency department for any new or worsening abdominal pain, fever, black or bloody stools, nausea and vomiting, inability to urinate, or any other symptoms concerning to you.  Urinary Tract Infection A urinary tract infection (UTI) can occur any place along the urinary tract. The tract includes the kidneys, ureters, bladder, and urethra. A type of germ called bacteria often causes a UTI. UTIs are often helped with antibiotic medicine.  HOME CARE   If given, take antibiotics as told by your doctor. Finish them even if you start to feel better.  Drink enough fluids to keep your pee (urine) clear or pale yellow.  Avoid tea, drinks with caffeine, and bubbly (carbonated) drinks.  Pee often. Avoid holding your pee in for a long time.  Pee before and after having sex (intercourse).  Wipe from front to back after you poop (bowel movement) if you are a woman. Use each tissue only once. GET HELP RIGHT AWAY IF:   You have back pain.  You have lower belly (abdominal) pain.  You have chills.  You feel sick to your stomach (nauseous).  You throw up (vomit).  Your burning or discomfort with peeing does not go away.  You have a fever.  Your symptoms are not better in 3 days. MAKE SURE YOU:   Understand these instructions.  Will watch your condition.  Will get help right away if you are not doing well or get worse. Document Released: 04/14/2008 Document Revised: 07/21/2012 Document Reviewed: 05/27/2012 [facility] Patient Information 2015 [place], [place]. This information is not intended to replace advice given to you by your health care provider. Make sure you discuss any questions you have with your health care provider.

## 2015-03-22 NOTE — ED Notes (Signed)
Critical platelet count reported to Dr. Rise Paganini, Evelina Bucy, RN

## 2015-03-22 NOTE — ED Notes (Signed)
Pt. Arrived to ed via ems from home with reports of LLQ pain that increased this AM. Pt reports that she has been experiencing this similar pain "for awhile" per pt but increased this AM. PT alert and oriented on arrival. Reports nausea and vomiting with pain.  EMS reports pt was seen over a month ago for similar symptoms.

## 2015-03-23 LAB — PATHOLOGIST SMEAR REVIEW

## 2015-03-25 ENCOUNTER — Emergency Department: Payer: Medicare Other

## 2015-03-25 ENCOUNTER — Emergency Department
Admission: EM | Admit: 2015-03-25 | Discharge: 2015-03-25 | Disposition: A | Discharge status: Home / Self Care | Payer: Medicare Other | Attending: Emergency Medicine | Admitting: Emergency Medicine

## 2015-03-25 ENCOUNTER — Encounter: Payer: Self-pay | Admitting: Emergency Medicine

## 2015-03-25 DIAGNOSIS — Y9289 Other specified places as the place of occurrence of the external cause: Secondary | ICD-10-CM | POA: Insufficient documentation

## 2015-03-25 DIAGNOSIS — F419 Anxiety disorder, unspecified: Secondary | ICD-10-CM | POA: Insufficient documentation

## 2015-03-25 DIAGNOSIS — T148XXA Other injury of unspecified body region, initial encounter: Secondary | ICD-10-CM

## 2015-03-25 DIAGNOSIS — Z792 Long term (current) use of antibiotics: Secondary | ICD-10-CM | POA: Insufficient documentation

## 2015-03-25 DIAGNOSIS — S8012XA Contusion of left lower leg, initial encounter: Secondary | ICD-10-CM | POA: Insufficient documentation

## 2015-03-25 DIAGNOSIS — Y998 Other external cause status: Secondary | ICD-10-CM | POA: Insufficient documentation

## 2015-03-25 DIAGNOSIS — Z79899 Other long term (current) drug therapy: Secondary | ICD-10-CM | POA: Insufficient documentation

## 2015-03-25 DIAGNOSIS — X58XXXA Exposure to other specified factors, initial encounter: Secondary | ICD-10-CM | POA: Insufficient documentation

## 2015-03-25 DIAGNOSIS — I10 Essential (primary) hypertension: Secondary | ICD-10-CM | POA: Diagnosis not present

## 2015-03-25 DIAGNOSIS — Y9389 Activity, other specified: Secondary | ICD-10-CM | POA: Insufficient documentation

## 2015-03-25 DIAGNOSIS — S8992XA Unspecified injury of left lower leg, initial encounter: Secondary | ICD-10-CM | POA: Diagnosis present

## 2015-03-25 DIAGNOSIS — Z7982 Long term (current) use of aspirin: Secondary | ICD-10-CM | POA: Insufficient documentation

## 2015-03-25 MED ORDER — NAPROXEN 375 MG PO TABS: 375.0000 mg | ORAL_TABLET | Freq: Two times a day (BID) | ORAL | Status: DC

## 2015-03-25 MED ORDER — NAPROXEN 500 MG PO TABS
ORAL_TABLET | ORAL | Status: AC
  Administered 2015-03-25: 500 mg via ORAL
  Filled 2015-03-25: qty 1

## 2015-03-25 MED ORDER — NAPROXEN 500 MG PO TABS
500.0000 mg | ORAL_TABLET | Freq: Once | ORAL | Status: AC
  Administered 2015-03-25: 500 mg via ORAL

## 2015-03-25 NOTE — ED Notes (Signed)
Contacted patient's granddaughter, [name], regarding patient's discharge. Granddaughter is out of town and unable to transport patient home. Discussed with patient. Patient states she would need to return by ambulance then

## 2015-03-25 NOTE — ED Notes (Signed)
Patient discharged. Transported back to facility by EMS.

## 2015-03-25 NOTE — ED Provider Notes (Signed)
[facility] Emergency Department Provider Note  ____________________________________________  Time seen: On arrival 11:20 AM  I have reviewed the triage vital signs and the nursing notes.   HISTORY  Chief Complaint Leg Pain    HPI Yolanda Wells is a 81 y.o. female who complains of a knot in her left leg that is causing her moderate pain since this morning. She first dose it when she got up to go to the bathroom which she was able to do with her walker as usual. She is concerned that she may have a blood clot because she has apparently had them in the past. She is not on blood thinners. No trauma to the area. The pain is aching in nature and is in the posterior portion of the distal left calf area     Past Medical History  Diagnosis Date  . Hypertension   . Polycythemia vera(238.4)   . Deep venous thrombosis     right lower extremity  . History of hysterectomy   . History of bilateral hip replacements     There are no active problems to display for this patient.   Past Surgical History  Procedure Laterality Date  . Toe amputation      right  . Joint replacement      Left and Right Hip  . Abdominal hysterectomy      Current Outpatient Rx  Name  Route  Sig  Dispense  Refill  . amLODipine (NORVASC) 5 MG tablet   Oral   Take 5 mg by mouth daily.         [name] aspirin 81 MG tablet   Oral   Take 81 mg by mouth daily.         . hydroxyurea (HYDREA) 500 MG capsule   Oral   Take 1,000 mg by mouth daily. May take with food to minimize GI side effects.         [name] levofloxacin (LEVAQUIN) 750 MG tablet   Oral   Take 1 tablet (750 mg total) by mouth daily.   7 tablet   0   . potassium chloride (KLOR-CON) 20 MEQ packet   Oral   Take 20 mEq by mouth daily.           Allergies Review of patient's allergies indicates no known allergies.  Family History  Problem Relation Age of Onset  . Heart attack Father     Social  History History  Substance Use Topics  . Smoking status: Never Smoker   . Smokeless tobacco: Not on file  . Alcohol Use: No    Review of Systems  Constitutional: Negative for fever. Eyes: Negative for visual changes. ENT: Negative for sore throat. Cardiovascular: Negative for chest pain. Respiratory: Negative for shortness of breath. Gastrointestinal: Negative for abdominal pain, vomiting and diarrhea. Genitourinary: Negative for dysuria. Musculoskeletal: Negative for back pain. Positive for left calf pain Skin: Negative for rash. Neurological: Negative for headaches, focal weakness or numbness. Psychiatric: Positive for anxiety  10-point ROS otherwise negative.  ____________________________________________   PHYSICAL EXAM:  VITAL SIGNS: ED Triage Vitals  Enc Vitals Group     BP 03/25/15 1127 149/82 mmHg     Pulse Rate 03/25/15 1127 74     Resp 03/25/15 1127 17     Temp 03/25/15 1127 98.2 F (36.8 C)     Temp Source 03/25/15 1127 Oral     SpO2 03/25/15 1126 100 %     Weight 03/25/15 1127 240 lb (  108.863 kg)     Height 03/25/15 1127 5\' 7"  (1.702 m)     Head Cir --      Peak Flow --      Pain Score 03/25/15 1131 8     Pain Loc --      Pain Edu? --      Excl. in [place]? --      Constitutional: Alert and oriented. Well appearing and in no distress. Pleasant  ENT   Head: Normocephalic and atraumatic.   Nose: No congestion/rhinnorhea.  Cardiovascular: Normal rate, regular rhythm. Normal and symmetric distal pulses are present in all extremities. No murmurs, rubs, or gallops. Respiratory: Normal respiratory effort without tachypnea nor retractions. Breath sounds are clear and equal bilaterally. No wheezes/rales/rhonchi. Gastrointestinal: Soft and nontender. No distention. There is no CVA tenderness. Genitourinary: deferred Musculoskeletal: Nontender with normal range of motion in all extremities. No joint effusions.  Patient has mild tenderness to palpation  distal left posterior calf. There is no swelling, there is a small area of ecchymosis. Distal pulses are intact. Neurologic:  Normal speech and language. No gross focal neurologic deficits are appreciated. Speech is normal.  Skin:  Skin is warm, dry and intact. No rash noted. Psychiatric: Mood and affect are normal. Speech and behavior are normal. Patient exhibits appropriate insight and judgment.  ____________________________________________    LABS (pertinent positives/negatives)  None  ____________________________________________   EKG  None  ____________________________________________    RADIOLOGY  Ultrasound negative for DVT questionable hematoma at site of pain.  ____________________________________________   PROCEDURES  Procedure(s) performed: None  Critical Care performed: None  ____________________________________________   INITIAL IMPRESSION / ASSESSMENT AND PLAN / ED COURSE  Pertinent labs & imaging results that were available during my care of the patient were reviewed by me and considered in my medical decision making (see chart for details).  Initial impression is that I have a low suspicion for a DVT however we will obtain ultrasound to reassure patient.  ----------------------------------------- 1:13 PM on 03/25/2015 -----------------------------------------  Ultrasound does not show DVT, there may be a small area of hematoma which is causing the patient pain. She will follow-up with her primary care provider.  ____________________________________________   FINAL CLINICAL IMPRESSION(S) / ED DIAGNOSES  Final diagnoses:  Hematoma and contusion     Lavonia Drafts, MD 03/25/15 1316

## 2015-03-25 NOTE — Discharge Instructions (Signed)
Contusion °A contusion is a deep bruise. Contusions are the result of an injury that caused bleeding under the skin. The contusion may turn blue, purple, or yellow. Minor injuries will give you a painless contusion, but more severe contusions may stay painful and swollen for a few weeks.  °CAUSES  °A contusion is usually caused by a blow, trauma, or direct force to an area of the body. °SYMPTOMS  °· Swelling and redness of the injured area. °· Bruising of the injured area. °· Tenderness and soreness of the injured area. °· Pain. °DIAGNOSIS  °The diagnosis can be made by taking a history and physical exam. An X-ray, CT scan, or MRI may be needed to determine if there were any associated injuries, such as fractures. °TREATMENT  °Specific treatment will depend on what area of the body was injured. In general, the best treatment for a contusion is resting, icing, elevating, and applying cold compresses to the injured area. Over-the-counter medicines may also be recommended for pain control. Ask your caregiver what the best treatment is for your contusion. °HOME CARE INSTRUCTIONS  °· Put ice on the injured area. °¨ Put ice in a plastic bag. °¨ Place a towel between your skin and the bag. °¨ Leave the ice on for 15-20 minutes, 3-4 times a day, or as directed by your health care provider. °· Only take over-the-counter or prescription medicines for pain, discomfort, or fever as directed by your caregiver. Your caregiver may recommend avoiding anti-inflammatory medicines (aspirin, ibuprofen, and naproxen) for 48 hours because these medicines may increase bruising. °· Rest the injured area. °· If possible, elevate the injured area to reduce swelling. °SEEK IMMEDIATE MEDICAL CARE IF:  °· You have increased bruising or swelling. °· You have pain that is getting worse. °· Your swelling or pain is not relieved with medicines. °MAKE SURE YOU:  °· Understand these instructions. °· Will watch your condition. °· Will get help right  away if you are not doing well or get worse. °Document Released: 08/06/2005 Document Revised: 11/01/2013 Document Reviewed: 09/01/2011 °ExitCare® Patient Information ©2015 [employer]. This information is not intended to replace advice given to you by your health care provider. Make sure you discuss any questions you have with your health care provider. °Hematoma °A hematoma is a collection of blood under the skin, in an organ, in a body space, in a joint space, or in other tissue. The blood can clot to form a lump that you can see and feel. The lump is often firm and may sometimes become sore and tender. Most hematomas get better in a few days to weeks. However, some hematomas may be serious and require medical care. Hematomas can range in size from very small to very large. °CAUSES  °A hematoma can be caused by a blunt or penetrating injury. It can also be caused by spontaneous leakage from a blood vessel under the skin. Spontaneous leakage from a blood vessel is more likely to occur in older people, especially those taking blood thinners. Sometimes, a hematoma can develop after certain medical procedures. °SIGNS AND SYMPTOMS  °· A firm lump on the body. °· Possible pain and tenderness in the area. °· Bruising. Blue, dark blue, purple-red, or yellowish skin may appear at the site of the hematoma if the hematoma is close to the surface of the skin. °For hematomas in deeper tissues or body spaces, the signs and symptoms may be subtle. For example, an intra-abdominal hematoma may cause abdominal pain, weakness, fainting, and   shortness of breath. An intracranial hematoma may cause a headache or symptoms such as weakness, trouble speaking, or a change in consciousness. °DIAGNOSIS  °A hematoma can usually be diagnosed based on your medical history and a physical exam. Imaging tests may be needed if your health care provider suspects a hematoma in deeper tissues or body spaces, such as the abdomen, head, or chest.  These tests may include ultrasonography or a CT scan.  °TREATMENT  °Hematomas usually go away on their own over time. Rarely does the blood need to be drained out of the body. Large hematomas or those that may affect vital organs will sometimes need surgical drainage or monitoring. °HOME CARE INSTRUCTIONS  °· Apply ice to the injured area:   °¨ Put ice in a plastic bag.   °¨ Place a towel between your skin and the bag.   °¨ Leave the ice on for 20 minutes, 2-3 times a day for the first 1 to 2 days.   °· After the first 2 days, switch to using warm compresses on the hematoma.   °· Elevate the injured area to help decrease pain and swelling. Wrapping the area with an elastic bandage may also be helpful. Compression helps to reduce swelling and promotes shrinking of the hematoma. Make sure the bandage is not wrapped too tight.   °· If your hematoma is on a lower extremity and is painful, crutches may be helpful for a couple days.   °· Only take over-the-counter or prescription medicines as directed by your health care provider. °SEEK IMMEDIATE MEDICAL CARE IF:  °· You have increasing pain, or your pain is not controlled with medicine.   °· You have a fever.   °· You have worsening swelling or discoloration.   °· Your skin over the hematoma breaks or starts bleeding.   °· Your hematoma is in your chest or abdomen and you have weakness, shortness of breath, or a change in consciousness. °· Your hematoma is on your scalp (caused by a fall or injury) and you have a worsening headache or a change in alertness or consciousness. °MAKE SURE YOU:  °· Understand these instructions. °· Will watch your condition. °· Will get help right away if you are not doing well or get worse. °Document Released: 06/10/2004 Document Revised: 06/29/2013 Document Reviewed: 04/06/2013 °ExitCare® Patient Information ©2015 [employer]. This information is not intended to replace advice given to you by your health care provider. Make sure you  discuss any questions you have with your health care provider. ° °

## 2015-03-25 NOTE — ED Notes (Signed)
EMS from [facility]. C/o LLE pain since this AM; states that she can feel a knot on the back of her leg.

## 2015-03-26 DIAGNOSIS — I639 Cerebral infarction, unspecified: Secondary | ICD-10-CM

## 2015-03-26 HISTORY — DX: Cerebral infarction, unspecified: I63.9

## 2015-04-01 ENCOUNTER — Inpatient Hospital Stay
Admission: EM | Admit: 2015-04-01 | Discharge: 2015-04-08 | DRG: 330 | Disposition: A | Discharge status: Skilled Nursing Facility | Payer: Medicare Other | Attending: Internal Medicine | Admitting: Internal Medicine

## 2015-04-01 ENCOUNTER — Encounter: Payer: Self-pay | Admitting: *Deleted

## 2015-04-01 DIAGNOSIS — Z90710 Acquired absence of both cervix and uterus: Secondary | ICD-10-CM

## 2015-04-01 DIAGNOSIS — K5731 Diverticulosis of large intestine without perforation or abscess with bleeding: Secondary | ICD-10-CM | POA: Diagnosis present

## 2015-04-01 DIAGNOSIS — Z96643 Presence of artificial hip joint, bilateral: Secondary | ICD-10-CM | POA: Diagnosis present

## 2015-04-01 DIAGNOSIS — Z8249 Family history of ischemic heart disease and other diseases of the circulatory system: Secondary | ICD-10-CM

## 2015-04-01 DIAGNOSIS — Z86718 Personal history of other venous thrombosis and embolism: Secondary | ICD-10-CM

## 2015-04-01 DIAGNOSIS — D45 Polycythemia vera: Secondary | ICD-10-CM | POA: Diagnosis present

## 2015-04-01 DIAGNOSIS — R0602 Shortness of breath: Secondary | ICD-10-CM

## 2015-04-01 DIAGNOSIS — I10 Essential (primary) hypertension: Secondary | ICD-10-CM | POA: Diagnosis present

## 2015-04-01 DIAGNOSIS — D62 Acute posthemorrhagic anemia: Secondary | ICD-10-CM | POA: Diagnosis present

## 2015-04-01 DIAGNOSIS — K922 Gastrointestinal hemorrhage, unspecified: Secondary | ICD-10-CM | POA: Diagnosis not present

## 2015-04-01 DIAGNOSIS — Z89421 Acquired absence of other right toe(s): Secondary | ICD-10-CM

## 2015-04-01 DIAGNOSIS — I959 Hypotension, unspecified: Secondary | ICD-10-CM | POA: Diagnosis present

## 2015-04-01 DIAGNOSIS — Z8261 Family history of arthritis: Secondary | ICD-10-CM

## 2015-04-01 DIAGNOSIS — I739 Peripheral vascular disease, unspecified: Secondary | ICD-10-CM | POA: Diagnosis present

## 2015-04-01 DIAGNOSIS — Z79899 Other long term (current) drug therapy: Secondary | ICD-10-CM

## 2015-04-01 DIAGNOSIS — K5791 Diverticulosis of intestine, part unspecified, without perforation or abscess with bleeding: Principal | ICD-10-CM | POA: Diagnosis present

## 2015-04-01 DIAGNOSIS — Z7982 Long term (current) use of aspirin: Secondary | ICD-10-CM

## 2015-04-01 LAB — CBC WITH DIFFERENTIAL/PLATELET
BASOS PCT: 1 %
Basophils Absolute: 0 10*3/uL (ref 0–0.1)
Eosinophils Absolute: 0.1 10*3/uL (ref 0–0.7)
Eosinophils Relative: 1 %
HCT: 39.5 % (ref 35.0–47.0)
Hemoglobin: 12.9 g/dL (ref 12.0–16.0)
LYMPHS ABS: 0.6 10*3/uL — AB (ref 1.0–3.6)
Lymphocytes Relative: 13 %
MCH: 33.7 pg (ref 26.0–34.0)
MCHC: 32.6 g/dL (ref 32.0–36.0)
MCV: 103.3 fL — ABNORMAL HIGH (ref 80.0–100.0)
MONOS PCT: 6 %
Monocytes Absolute: 0.3 10*3/uL (ref 0.2–0.9)
NEUTROS PCT: 79 %
Neutro Abs: 3.8 10*3/uL (ref 1.4–6.5)
Platelets: 571 10*3/uL — ABNORMAL HIGH (ref 150–440)
RBC: 3.83 MIL/uL (ref 3.80–5.20)
RDW: 16.4 % — ABNORMAL HIGH (ref 11.5–14.5)
WBC: 4.8 10*3/uL (ref 3.6–11.0)

## 2015-04-01 LAB — PROTIME-INR
INR: 1.25
Prothrombin Time: 15.9 seconds — ABNORMAL HIGH (ref 11.4–15.0)

## 2015-04-01 LAB — COMPREHENSIVE METABOLIC PANEL
ALBUMIN: 3.1 g/dL — AB (ref 3.5–5.0)
ALT: 7 U/L — ABNORMAL LOW (ref 14–54)
AST: 15 U/L (ref 15–41)
Alkaline Phosphatase: 73 U/L (ref 38–126)
Anion gap: 5 (ref 5–15)
BILIRUBIN TOTAL: 0.7 mg/dL (ref 0.3–1.2)
BUN: 19 mg/dL (ref 6–20)
CO2: 24 mmol/L (ref 22–32)
Calcium: 8.2 mg/dL — ABNORMAL LOW (ref 8.9–10.3)
Chloride: 108 mmol/L (ref 101–111)
Creatinine, Ser: 0.83 mg/dL (ref 0.44–1.00)
GFR calc Af Amer: >60 mL/min (ref >60)
GLUCOSE: 130 mg/dL — AB (ref 65–99)
POTASSIUM: 4.3 mmol/L (ref 3.5–5.1)
Sodium: 137 mmol/L (ref 135–145)
Total Protein: 6.4 g/dL — ABNORMAL LOW (ref 6.5–8.1)

## 2015-04-01 NOTE — ED Provider Notes (Signed)
[facility] Emergency Department Provider Note  ____________________________________________  Time seen: 2220  I have reviewed the triage vital signs and the nursing notes.   HISTORY  Chief Complaint Rectal Bleeding     HPI Yolanda Wells is a 81 y.o. female who stays at [facility]. She is sent to the emergency department because this evening she had bright red blood in her diaper and in the toilet after having a bowel movement.    The patient reports that she has been having left-sided abdominal pain on and off for the past few months. She's been seen in the emergency department for this before. She reports she is also been seen at [facility] once.  I have reviewed the visits from May 12 and May 15 at [facility].  The patient did have additional pain and left abdomen earlier this evening. She reports she is not having any pain now.  She denies any chest pain or shortness of breath. She has no nausea or vomiting.      Past Medical History  Diagnosis Date  . Hypertension   . Polycythemia vera(238.4)   . Deep venous thrombosis     right lower extremity  . History of hysterectomy   . History of bilateral hip replacements     There are no active problems to display for this patient.   Past Surgical History  Procedure Laterality Date  . Toe amputation      right  . Joint replacement      Left and Right Hip  . Abdominal hysterectomy      Current Outpatient Rx  Name  Route  Sig  Dispense  Refill  . amLODipine (NORVASC) 5 MG tablet   Oral   Take 5 mg by mouth daily.         [name] aspirin 81 MG tablet   Oral   Take 81 mg by mouth daily.         . hydroxyurea (HYDREA) 500 MG capsule   Oral   Take 1,000 mg by mouth daily. May take with food to minimize GI side effects.         . naproxen (NAPROSYN) 375 MG tablet   Oral   Take 1 tablet (375 mg total) by mouth 2 (two) times daily with a meal.   20 tablet   2   . potassium chloride  (KLOR-CON) 20 MEQ packet   Oral   Take 20 mEq by mouth daily.           Allergies Review of patient's allergies indicates no known allergies.  Family History  Problem Relation Age of Onset  . Heart attack Father     Social History History  Substance Use Topics  . Smoking status: Never Smoker   . Smokeless tobacco: Not on file  . Alcohol Use: No    Review of Systems Constitutional: Negative for fever. ENT: Negative for sore throat. Cardiovascular: Negative for chest pain. Respiratory: Negative for shortness of breath. Gastrointestinal: Positive for intermittent abdominal pain. Positive for GI bleed. See history of present illness Genitourinary: Negative for dysuria. Musculoskeletal: Recent evaluation for leg pain and possible DVT (ultrasound negative for DVT). Skin: Negative for rash. Neurological: Negative for headaches   10-point ROS otherwise negative.  ____________________________________________   PHYSICAL EXAM:  VITAL SIGNS: ED Triage Vitals  Enc Vitals Group     BP --      Pulse Rate 04/01/15 2159 81     Resp 04/01/15 2159 16  Temp 04/01/15 2159 97.7 F (36.5 C)     Temp Source 04/01/15 2159 Oral     SpO2 04/01/15 2159 96 %     Weight 04/01/15 2159 220 lb (99.791 kg)     Height 04/01/15 2159 5\' 7"  (1.702 m)     Head Cir --      Peak Flow --      Pain Score --      Pain Loc --      Pain Edu? --      Excl. in [place]? --     Constitutional: Alert and communicative. There may be some mild confusion. No distress. ENT   Head: Normocephalic and atraumatic. Cardiovascular: Normal rate, regular rhythm. Respiratory: Normal respiratory effort without tachypnea. Breath sounds are clear and equal bilaterally. No wheezes/rales/rhonchi. Gastrointestinal: Soft and nontender. No distention.  Rectal: Patient with a moderate amount of blood in the diaper. Musculoskeletal: Nontender with normal range of motion in all extremities.  Positive edema in both  legs. Neurologic:  Alert, communicative. Patient is a little bit defined.. No gross focal neurologic deficits are appreciated.  Skin:  Skin is warm, dry. No rash noted. Psychiatric: Patient appears frustrated with providing medical history. She reports she is been here many times and I spoke and the record. She says that the people are not listening to her when she comes to the hospital. She has a slightly defiant attitude and tone. She is communicative and has overall intact thought process. ____________________________________________    LABS (pertinent positives/negatives)  CBC shows white blood cells of 4.8, hemoglobin of 12.9. Metabolic panel and PT INR is pending. Pending  ____________________________________________ ____________________________________________    RADIOLOGY  Tag red blood cell scan pending  ____________________________________________   PROCEDURES  Critical Care performed: CRITICAL CARE Performed by: Ahmed Prima  Patient was active GI bleed. Requires tagged red blood cell scan. Discussed with new clear medicine technician. Discussed with Dr. Lavetta Nielsen of the hospitalist service.  Total critical care time: 40 minutes  Critical care time was exclusive of separately billable procedures and treating other patients.  Critical care was necessary to treat or prevent imminent or life-threatening deterioration.  Critical care was time spent personally by me on the following activities: development of treatment plan with patient and/or surrogate as well as nursing, discussions with consultants, evaluation of patient's response to treatment, examination of patient, obtaining history from patient or surrogate, ordering and performing treatments and interventions, ordering and review of laboratory studies, ordering and review of radiographic studies, pulse oximetry and re-evaluation of patient's condition.   ____________________________________________   INITIAL  IMPRESSION / ASSESSMENT AND PLAN / ED COURSE  Patient with an active GI bleed. We will perform a tagged red blood cell scan. We are calling the nuclear medicine technician and now to perform this. We have discussed the case with Dr. Lavetta Nielsen of internal medicine. They will admit the patient in the hospital, resuscitate as needed, and get a GI consult.  Currently her hemoglobin level is 12.9 which is reassuring. ____________________________________________   FINAL CLINICAL IMPRESSION(S) / ED DIAGNOSES  Final diagnoses:  Gastrointestinal bleeding, lower      Ahmed Prima, MD 04/01/15 2302

## 2015-04-01 NOTE — ED Notes (Signed)
Pt arrived via EMS from [facility] reporting bright red blood in diaper and in toilet after having a bowel movement 30 minutes ago. Pt reports feeling sore in LLQ of abd. Pt discharged from hospital last Thursday after being seen for LLQ pain. Pt reports pain for the past 2 months. LLQ not tender to the touch.

## 2015-04-02 ENCOUNTER — Encounter
Admission: EM | Disposition: A | Discharge status: Skilled Nursing Facility | Payer: Medicare Other | Source: Home / Self Care | Attending: Internal Medicine

## 2015-04-02 ENCOUNTER — Encounter: Payer: Self-pay | Admitting: Internal Medicine

## 2015-04-02 ENCOUNTER — Emergency Department: Payer: Medicare Other

## 2015-04-02 DIAGNOSIS — K5731 Diverticulosis of large intestine without perforation or abscess with bleeding: Secondary | ICD-10-CM | POA: Diagnosis present

## 2015-04-02 DIAGNOSIS — Z7982 Long term (current) use of aspirin: Secondary | ICD-10-CM | POA: Diagnosis not present

## 2015-04-02 DIAGNOSIS — Z89421 Acquired absence of other right toe(s): Secondary | ICD-10-CM | POA: Diagnosis not present

## 2015-04-02 DIAGNOSIS — D45 Polycythemia vera: Secondary | ICD-10-CM | POA: Diagnosis present

## 2015-04-02 DIAGNOSIS — Z96643 Presence of artificial hip joint, bilateral: Secondary | ICD-10-CM | POA: Diagnosis present

## 2015-04-02 DIAGNOSIS — I959 Hypotension, unspecified: Secondary | ICD-10-CM | POA: Diagnosis present

## 2015-04-02 DIAGNOSIS — K922 Gastrointestinal hemorrhage, unspecified: Secondary | ICD-10-CM | POA: Diagnosis present

## 2015-04-02 DIAGNOSIS — I739 Peripheral vascular disease, unspecified: Secondary | ICD-10-CM | POA: Diagnosis present

## 2015-04-02 DIAGNOSIS — K5791 Diverticulosis of intestine, part unspecified, without perforation or abscess with bleeding: Secondary | ICD-10-CM

## 2015-04-02 DIAGNOSIS — Z90710 Acquired absence of both cervix and uterus: Secondary | ICD-10-CM | POA: Diagnosis not present

## 2015-04-02 DIAGNOSIS — Z86718 Personal history of other venous thrombosis and embolism: Secondary | ICD-10-CM | POA: Diagnosis not present

## 2015-04-02 DIAGNOSIS — Z8249 Family history of ischemic heart disease and other diseases of the circulatory system: Secondary | ICD-10-CM | POA: Diagnosis not present

## 2015-04-02 DIAGNOSIS — D62 Acute posthemorrhagic anemia: Secondary | ICD-10-CM | POA: Diagnosis present

## 2015-04-02 DIAGNOSIS — Z79899 Other long term (current) drug therapy: Secondary | ICD-10-CM | POA: Diagnosis not present

## 2015-04-02 DIAGNOSIS — Z8261 Family history of arthritis: Secondary | ICD-10-CM | POA: Diagnosis not present

## 2015-04-02 DIAGNOSIS — I10 Essential (primary) hypertension: Secondary | ICD-10-CM | POA: Diagnosis present

## 2015-04-02 HISTORY — PX: PERIPHERAL VASCULAR CATHETERIZATION: SHX172C

## 2015-04-02 HISTORY — DX: Diverticulosis of intestine, part unspecified, without perforation or abscess with bleeding: K57.91

## 2015-04-02 LAB — URINALYSIS COMPLETE WITH MICROSCOPIC (ARMC ONLY)
BACTERIA UA: NONE SEEN
Bilirubin Urine: NEGATIVE
Glucose, UA: NEGATIVE mg/dL
Hgb urine dipstick: NEGATIVE
Ketones, ur: NEGATIVE mg/dL
LEUKOCYTES UA: NEGATIVE
Nitrite: NEGATIVE
Protein, ur: 30 mg/dL — AB
Specific Gravity, Urine: 1.029 (ref 1.005–1.030)
pH: 5 (ref 5.0–8.0)

## 2015-04-02 LAB — HEMOGLOBIN AND HEMATOCRIT, BLOOD
HCT: 27.3 % — ABNORMAL LOW (ref 35.0–47.0)
Hemoglobin: 8.8 g/dL — ABNORMAL LOW (ref 12.0–16.0)

## 2015-04-02 LAB — HEMOGLOBIN
HEMOGLOBIN: 10.4 g/dL — AB (ref 12.0–16.0)
Hemoglobin: 10.8 g/dL — ABNORMAL LOW (ref 12.0–16.0)

## 2015-04-02 LAB — PREPARE RBC (CROSSMATCH)

## 2015-04-02 LAB — ABO/RH: ABO/RH(D): O POS

## 2015-04-02 SURGERY — VISCERAL ANGIOGRAPHY
Anesthesia: Moderate Sedation

## 2015-04-02 MED ORDER — POTASSIUM CHLORIDE 20 MEQ PO PACK: 20.0000 meq | PACK | Freq: Every day | ORAL | Status: DC

## 2015-04-02 MED ORDER — NAPROXEN 375 MG PO TABS: 375.0000 mg | ORAL_TABLET | Freq: Two times a day (BID) | ORAL | Status: DC

## 2015-04-02 MED ORDER — ONDANSETRON HCL 4 MG/2ML IJ SOLN
4.0000 mg | Freq: Four times a day (QID) | INTRAMUSCULAR | Status: DC | PRN
  Administered 2015-04-02 – 2015-04-07 (×5): 4 mg via INTRAVENOUS
  Filled 2015-04-02 (×5): qty 2

## 2015-04-02 MED ORDER — SODIUM CHLORIDE 0.9 % IV SOLN
INTRAVENOUS | Status: DC
  Administered 2015-04-02: 11:00:00 via INTRAVENOUS
  Administered 2015-04-03: 125 mL/h via INTRAVENOUS
  Administered 2015-04-03 – 2015-04-04 (×3): via INTRAVENOUS

## 2015-04-02 MED ORDER — MIDAZOLAM HCL 2 MG/2ML IJ SOLN
INTRAMUSCULAR | Status: DC | PRN
  Administered 2015-04-02 (×2): 2 mg via INTRAVENOUS

## 2015-04-02 MED ORDER — LIDOCAINE-EPINEPHRINE (PF) 1 %-1:200000 IJ SOLN
INTRAMUSCULAR | Status: DC | PRN
  Administered 2015-04-02: 10 mL

## 2015-04-02 MED ORDER — SODIUM CHLORIDE 0.9 % IV SOLN
Freq: Once | INTRAVENOUS | Status: AC
  Administered 2015-04-02: 14:00:00 via INTRAVENOUS

## 2015-04-02 MED ORDER — CEFAZOLIN SODIUM 1-5 GM-% IV SOLN
1.0000 g | INTRAVENOUS | Status: AC
  Administered 2015-04-02: 1 g via INTRAVENOUS
  Filled 2015-04-02: qty 50

## 2015-04-02 MED ORDER — NOREPINEPHRINE 4 MG/250ML-% IV SOLN
2.0000 ug/min | INTRAVENOUS | Status: DC
  Filled 2015-04-02: qty 250

## 2015-04-02 MED ORDER — ONDANSETRON HCL 4 MG PO TABS: 4.0000 mg | ORAL_TABLET | Freq: Four times a day (QID) | ORAL | Status: DC | PRN

## 2015-04-02 MED ORDER — FENTANYL CITRATE (PF) 100 MCG/2ML IJ SOLN
INTRAMUSCULAR | Status: DC | PRN
  Administered 2015-04-02 (×2): 50 ug via INTRAVENOUS

## 2015-04-02 MED ORDER — NOREPINEPHRINE BITARTRATE 1 MG/ML IV SOLN
2.0000 ug/min | INTRAVENOUS | Status: DC
  Filled 2015-04-02: qty 4

## 2015-04-02 MED ORDER — TECHNETIUM TC 99M-LABELED RED BLOOD CELLS IV KIT
20.7790 | PACK | Freq: Once | INTRAVENOUS | Status: AC | PRN
  Administered 2015-04-01: 20.799 via INTRAVENOUS

## 2015-04-02 MED ORDER — IOHEXOL 350 MG/ML SOLN
INTRAVENOUS | Status: DC | PRN
  Administered 2015-04-02: 45 mL via INTRA_ARTERIAL

## 2015-04-02 MED ORDER — MORPHINE SULFATE 2 MG/ML IJ SOLN
1.0000 mg | INTRAMUSCULAR | Status: DC | PRN
  Administered 2015-04-02: 1 mg via INTRAVENOUS
  Filled 2015-04-02: qty 1

## 2015-04-02 MED ORDER — HYDROXYUREA 500 MG PO CAPS
1000.0000 mg | ORAL_CAPSULE | Freq: Every day | ORAL | Status: DC
  Filled 2015-04-02: qty 2

## 2015-04-02 MED ORDER — SODIUM CHLORIDE 0.9 % IV SOLN
Freq: Once | INTRAVENOUS | Status: AC
  Administered 2015-04-02: 15:00:00 via INTRAVENOUS

## 2015-04-02 MED ORDER — ACETAMINOPHEN 325 MG PO TABS
650.0000 mg | ORAL_TABLET | Freq: Once | ORAL | Status: AC
  Administered 2015-04-02: 650 mg via ORAL

## 2015-04-02 MED ORDER — ACETAMINOPHEN 325 MG PO TABS
ORAL_TABLET | ORAL | Status: AC
  Administered 2015-04-02: 650 mg via ORAL
  Filled 2015-04-02: qty 2

## 2015-04-02 MED ORDER — AMLODIPINE BESYLATE 5 MG PO TABS: 5.0000 mg | ORAL_TABLET | Freq: Every day | ORAL | Status: DC

## 2015-04-02 SURGICAL SUPPLY — 12 items
BLOCK BEAD 300-500 MIC (Vascular Products) ×3 IMPLANT
CATH MICROCATH PRGRT 2.8F 110 (CATHETERS) ×1 IMPLANT
CATH ROYAL FLUSH PIG 5F 70CM (CATHETERS) ×3 IMPLANT
CATH VS15FR (CATHETERS) ×3 IMPLANT
DEVICE STARCLOSE SE CLOSURE (Vascular Products) ×3 IMPLANT
GUIDEWIRE ANGLED .035 180CM (WIRE) ×3 IMPLANT
MICROCATH PROGREAT 2.8F 110 CM (CATHETERS) ×3
PACK ANGIOGRAPHY (CUSTOM PROCEDURE TRAY) ×3 IMPLANT
SHEATH BRITE TIP 5FRX11 (SHEATH) ×3 IMPLANT
SYR MEDRAD MARK V 150ML (SYRINGE) ×3 IMPLANT
TUBING CONTRAST HIGH PRESS 72 (TUBING) ×3 IMPLANT
WIRE J 3MM .035X145CM (WIRE) ×3 IMPLANT

## 2015-04-02 NOTE — Consult Note (Signed)
[facility] Vascular Consult Note  MRN : [id number]  Yolanda Wells is a 81 y.o. (1932-10-15) female who presents with chief complaint of  Chief Complaint  Patient presents with  . Rectal Bleeding  .  History of Present Illness: Patient presents with approximately 1-2 day history of lower GI bleeding. She was admitted yesterday to the hospital. This is painless bleeding without any nausea, vomiting, or abdominal pain to speak of. She has no previous history of GI bleed. She has a remote history of DVT on the right but is not on any current anticoagulants other than aspirin. She has had persistent bleeding which has not improved over the past 24 hours despite fluid resuscitation. Her blood pressure and heart rate have been normal, but of trend down. She reports no previous episodes of GI surgery or GI bleeding to her knowledge. There've been no alleviating factors to stop her bleeding. There were no inciting events that caused the bleeding. She had a bleeding scan which I have independently reviewed and appears to have active bleeding beginning at the splenic flexure and proximal left colon.  Current Facility-Administered Medications  Medication Dose Route Frequency Provider Last Rate Last Dose  . 0.9 %  sodium chloride infusion   Intravenous Continuous Algernon Huxley, MD 100 mL/hr at 04/02/15 1054    . amLODipine (NORVASC) tablet 5 mg  5 mg Oral Daily Harrie Foreman, MD   5 mg at 04/02/15 0947  . ceFAZolin (ANCEF) IVPB 1 g/50 mL premix  1 g Intravenous On Call Algernon Huxley, MD   1 g at 04/02/15 1257  . hydroxyurea (HYDREA) capsule 1,000 mg  1,000 mg Oral Daily Harrie Foreman, MD   1,000 mg at 04/02/15 0947  . ondansetron (ZOFRAN) tablet 4 mg  4 mg Oral Q6H PRN Harrie Foreman, MD       Or  . ondansetron [facility]) injection 4 mg  4 mg Intravenous Q6H PRN Harrie Foreman, MD      . potassium chloride (KLOR-CON) packet 20 mEq  20 mEq Oral Daily Harrie Foreman, MD    20 mEq at 04/02/15 7741    Past Medical History  Diagnosis Date  . Hypertension   . Polycythemia vera(238.4)   . Deep venous thrombosis     right lower extremity  . History of hysterectomy   . History of bilateral hip replacements     Past Surgical History  Procedure Laterality Date  . Toe amputation      right  . Joint replacement      Left and Right Hip  . Abdominal hysterectomy      Social History History  Substance Use Topics  . Smoking status: Never Smoker   . Smokeless tobacco: Not on file  . Alcohol Use: No  no illicit drug use.  Lives at home  Family History Family History  Problem Relation Age of Onset  . Heart attack Father   . Hypertension    . Arthritis-Osteo    no bleeding or clotting problems in the family that she knows of  No Known Allergies   REVIEW OF SYSTEMS (Negative unless checked)  Constitutional: [] Weight loss  [] Fever  [] Chills Cardiac: [] Chest pain   [] Chest pressure   [] Palpitations   [] Shortness of breath when laying flat   [] Shortness of breath at rest   [] Shortness of breath with exertion. Vascular:  [] Pain in legs with walking   [] Pain in legs at rest   []   Pain in legs when laying flat   [] Claudication   [] Pain in feet when walking  [] Pain in feet at rest  [] Pain in feet when laying flat   [x] History of DVT   [] Phlebitis   [] Swelling in legs   [] Varicose veins   [] Non-healing ulcers Pulmonary:   [] Uses home oxygen   [] Productive cough   [] Hemoptysis   [] Wheeze  [] COPD   [] Asthma Neurologic:  [] Dizziness  [] Blackouts   [] Seizures   [] History of stroke   [] History of TIA  [] Aphasia   [] Temporary blindness   [] Dysphagia   [] Weakness or numbness in arms   [] Weakness or numbness in legs Musculoskeletal:  [] Arthritis   [] Joint swelling   [] Joint pain   [] Low back pain Hematologic:  [] Easy bruising  [] Easy bleeding   [] Hypercoagulable state   [] Anemic  [] Hepatitis Gastrointestinal:  [x] Blood in stool   [] Vomiting blood  [] Gastroesophageal  reflux/heartburn   [] Difficulty swallowing. Genitourinary:  [] Chronic kidney disease   [] Difficult urination  [] Frequent urination  [] Burning with urination   [] Blood in urine Skin:  [] Rashes   [] Ulcers   [] Wounds Psychological:  [] History of anxiety   []  History of major depression.  Physical Examination  Filed Vitals:   04/02/15 0400 04/02/15 0532 04/02/15 0609 04/02/15 1042  BP: 110/60 96/68  107/61  Pulse:  81  83  Temp:  97.7 F (36.5 C)  97.8 F (36.6 C)  TempSrc:  Oral    Resp: 18   19  Height:   5\' 7"  (1.702 m)   Weight:   214 lb 12.8 oz (97.433 kg)   SpO2:  99%  99%   Body mass index is 33.63 kg/(m^2).  Head: [place]/AT, No temporalis wasting. Prominent temp pulse not noted. Ear/Nose/Throat: Hearing grossly intact, nares w/o erythema or drainage, oropharynx w/o Erythema/Exudate, Eyes: PERRLA, EOMI.  Neck: Supple, no nuchal rigidity.  No bruit or JVD.  Pulmonary:  Good air movement, clear to auscultation bilaterally.  Cardiac: RRR, normal S1, S2, no Murmurs, rubs or gallops. Vascular: right TMA well healed Vessel Right Left  Radial Palpable Palpable  Ulnar Palpable Palpable  Brachial Palpable Palpable  Carotid Palpable, without bruit Palpable, without bruit  Aorta Not palpable N/A  Femoral Palpable Palpable  Popliteal Palpable Palpable  PT Palpable Palpable  DP Palpable Palpable   Gastrointestinal: soft, non-tender/non-distended. No guarding/reflex. No masses, surgical incisions, or scars. Musculoskeletal: M/S 5/5 throughout.  Extremities without ischemic changes.  No deformity or atrophy. No edema. Neurologic: CN 2-12 intact. Pain and light touch intact in extremities.  Symmetrical.  Speech is fluent. Motor exam as listed above. Psychiatric: Judgment intact, Mood & affect appropriate for pt's clinical situation. Dermatologic: No rashes or ulcers noted.  No cellulitis or open wounds. Lymph : No Cervical, Axillary, or Inguinal lymphadenopathy.  Diagnostic  Studies Bleeding scan positive as above     CBC Lab Results  Component Value Date   WBC 4.8 04/01/2015   HGB 10.8* 04/02/2015   HCT 39.5 04/01/2015   MCV 103.3* 04/01/2015   PLT 571* 04/01/2015    BMET    Component Value Date/Time   NA 137 04/01/2015 2230   NA 144 07/08/2014 0936   K 4.3 04/01/2015 2230   K 3.7 07/08/2014 0936   CL 108 04/01/2015 2230   CL 108* 07/08/2014 0936   CO2 24 04/01/2015 2230   CO2 27 07/08/2014 0936   GLUCOSE 130* 04/01/2015 2230   GLUCOSE 90 07/08/2014 0936   BUN 19 04/01/2015 2230  BUN 15 07/08/2014 0936   CREATININE 0.83 04/01/2015 2230   CREATININE 0.64 07/08/2014 0936   CALCIUM 8.2* 04/01/2015 2230   CALCIUM 8.4* 07/08/2014 0936   GFRNONAA >60 04/01/2015 2230   GFRNONAA >60 07/08/2014 0936   GFRAA >60 04/01/2015 2230   GFRAA >60 07/08/2014 0936   Estimated Creatinine Clearance: 63.7 mL/min (by C-G formula based on Cr of 0.83).  COAG Lab Results  Component Value Date   INR 1.25 04/01/2015   INR 1.5 02/20/2012    Radiology Bleeding scan positive as above    Assessment/Plan 1.  Brisk lower GI bleed with a positive bleeding scan.  She had a bleeding scan which I have independently reviewed and appears to have active bleeding beginning at the splenic flexure and proximal left colon. Risks and benefits of embolization versus surgery versus observation were discussed. Embolization being much less morbid than surgery would be the preferred method of treatment in a patient with active bleeding and a localized source. Risks including continued bleeding or recurrent bleeding and ischemia were all discussed. She is agreeable to proceed. 2.  History of DVT. Remote. Not on anticoagulation and this should not be started. 3. History of transmetatarsal amputation presumably for peripheral arterial disease. Stable with no changes. 4. Polycythemia vera. Management per primary service. 5. Hypertension, chronic. Concern more about hypotension  with acute blood loss anemia from lower GI bleed. May consider holding medications at this time.    Malinda Mayden, MD  04/02/2015 1:16 PM    Level 4 consult

## 2015-04-02 NOTE — Progress Notes (Signed)
Patient alert and oriented. NSR on monitor. Vitals stable at this time.  No signs of bleeding- right groin site clean and dry.  Per Dr. Marina Gravel- no orders at this time.  MAP goal 65 and above and morphine ordered for pain per Dr. Posey Pronto.  Dr. Posey Pronto ordered to recheck hgb after 1 unit of RBC infused- if hgb less than 8- give 2nd unit of RBC.  Pt resting at this time.

## 2015-04-02 NOTE — Consult Note (Signed)
Patient ID: Yolanda Wells, female   DOB: 1932-09-13, 81 y.o.   MRN: [id number]  Chief Complaint  Patient presents with  . Rectal Bleeding    Yolanda Wells is a 81 y.o. female.    HPI    81 year old female presents to the emergency room last night with left upper quadrant abdominal pain followed by painless rectal bleeding. She has a history of polycythemia vera. Her hemoglobins range in the 15-16 range. Admission hemoglobin was found to be 13.8. She had hypotension as well as continued bleeding and a nuclear medicine bleeding scan was performed demonstrating lower GI bleeding from the proximal descending colon. The patient was admitted to the hospital and resuscitated. Of note the patient states that in 1998 she had a similar episode of lower GI bleeding which was controlled with colonoscopy in which we did not require surgical intervention. She states that she's not had a colonoscopy for at least 10 years. This is corroborated by her granddaughter at the bedside.      Vascular surgery did become involved in a attempt at embolization with polyvinyl beads was performed in the area of the splenic flexure however there was continued brisk bleeding. The patient since been transferred to the intensive care unit due to significant hypotension and is getting a blood transfusion at this time. She is on no pressors.    In review of her history the patient denies any chills, fever, jaundice. She has had multiple trips to the local emergency room as well as her PCP with abdominal pain which has been treated with Bentyl. No colonoscopy has been warranted.  Prior surgical procedures include a hysterectomy on the abdomen.  The ICU nurse reports that the patient is unaware of the last bloody bowel movement.   Past Medical History  Diagnosis Date  . Hypertension   . Polycythemia vera(238.4)   . Deep venous thrombosis     right lower extremity  . History of hysterectomy   . History of bilateral hip  replacements     Past Surgical History  Procedure Laterality Date  . Toe amputation      right  . Joint replacement      Left and Right Hip  . Abdominal hysterectomy      Family History  Problem Relation Age of Onset  . Heart attack Father   . Hypertension    . Arthritis-Osteo      Social History History  Substance Use Topics  . Smoking status: Never Smoker   . Smokeless tobacco: Not on file  . Alcohol Use: No    No Known Allergies  Current Facility-Administered Medications  Medication Dose Route Frequency Provider Last Rate Last Dose  . 0.9 %  sodium chloride infusion   Intravenous Continuous Dustin Flock, MD 100 mL/hr at 04/02/15 1054    . 0.9 %  sodium chloride infusion   Intravenous Once Algernon Huxley, MD      . norepinephrine (LEVOPHED) 4mg  in D5W 279mL premix infusion  2 mcg/min Intravenous Titrated Dustin Flock, MD      . ondansetron (ZOFRAN) tablet 4 mg  4 mg Oral Q6H PRN Harrie Foreman, MD       Or  . ondansetron [facility]) injection 4 mg  4 mg Intravenous Q6H PRN Harrie Foreman, MD          Review of Systems A 10 point review of systems was asked and was negative except for the following positive findings,  rectal bleeding.  Blood pressure  92/67, pulse 77, temperature 97.7 F (36.5 C), temperature source Oral, resp. rate 19, height 5\' 7"  (1.702 m), weight 97.433 kg (214 lb 12.8 oz), SpO2 100 %.   Prior SBP was 67 mm Hg at 1330 hrs.  BP at 1 am was 130/86  Physical Exam CONSTITUTIONAL:  Pleasant, well-developed, well-nourished, and in no acute distress. EYES: Pupils equal and reactive to light, Sclera non-icteric EARS, NOSE, MOUTH AND THROAT:  The oropharynx was clear.  Dentition is good repair.  Oral mucosa pink and moist. LYMPH NODES:  Lymph nodes in the neck and axillae were normal RESPIRATORY:  Lungs were clear.  Normal respiratory effort without pathologic use of accessory muscles of respiration CARDIOVASCULAR: Heart was regular without  murmurs.  There were no carotid bruits. GI: The abdomen was soft, nontender, and non distended. There were no palpable masses. There was no hepatosplenomegaly. There were normal bowel sounds in all quadrants. GU:  Rectal deferred.   MUSCULOSKELETAL:  Normal muscle strength and tone.  No clubbing or cyanosis.   SKIN:  There were no pathologic skin lesions.  There were no nodules on palpation. NEUROLOGIC:  Sensation is normal.  Cranial nerves are grossly intact. PSYCH:  Oriented to person, place and time.  Mood and affect are normal.  Data Reviewed I personally reviewed the angiogram report as well as the nuclear medicine studies.  CBC Latest Ref Rng 04/02/2015 04/02/2015 04/01/2015  WBC 3.6 - 11.0 K/uL - - 4.8  Hemoglobin 12.0 - 16.0 g/dL 8.8(L) 10.8(L) 12.9  Hematocrit 35.0 - 47.0 % 27.3(L) - 39.5  Platelets 150 - 440 K/uL - - 571(H)   BMP Latest Ref Rng 04/01/2015 03/22/2015 07/08/2014  Glucose 65 - 99 mg/dL 130(H) 93 90  BUN 6 - 20 mg/dL 19 12 15   Creatinine 0.44 - 1.00 mg/dL 0.83 0.52 0.64  Sodium 135 - 145 mmol/L 137 138 144  Potassium 3.5 - 5.1 mmol/L 4.3 4.7 3.7  Chloride 101 - 111 mmol/L 108 107 108(H)  CO2 22 - 32 mmol/L 24 24 27   Calcium 8.9 - 10.3 mg/dL 8.2(L) 9.0 8.4(L)     I have personally reviewed the patient's imaging, laboratory findings and medical records.    Assessment    Rather brisk lower GI bleeding a substantial drop in her hemoglobin from presumed baseline which is at least 15 due to polycythemia vera.  Attempt at embolization today was marginally successful. At present she is only receiving 1 unit of blood. Her blood pressure is 82/57 upon my evaluation but the patient is mentating. Heart rate was in the 80s.    Plan    At the present time I see no acute indication for surgical intervention which would need to be a splenic flexure colectomy and end colostomy with Hartman's pouch.   This carries some morbidity. I would transfuse at least 2 units of packed red  blood cells and follow her CBC and clinical exam very closely.   If she continues to have any further bleeding she will need surgical intervention.  I suspect that she will clot his area of bleeding off after I spoke with Dr Lucky Cowboy of Vascular surgery given the amount of beads he injected into the area this am.    I discussed this in detail with both her and her granddaughter and have a very low threshold for surgical intervention in this patient.    Time spent with patient was 70 minutes, with more than 50% of the time spent counseling and coordinating care  of patient.     Yolanda Wells, [employer] 04/02/2015, 4:21 PM

## 2015-04-02 NOTE — Progress Notes (Signed)
[place] [facility]                                                                                                                                                                                            Patient Demographics   Yolanda Wells, is a 81 y.o. female, DOB - [birth year], SHF:[id number]  Admit date - 04/01/2015   Admitting Physician Harrie Foreman, MD  Outpatient Primary MD for the patient is No primary care provider on file.  LOS - 0  Chief Complaint  Patient presents with  . Rectal Bleeding    Patient was seen by vascular and had following procedures done  1. Ultrasound guidance for vascular access right femoral artery 2. Catheter placement into splenic flexure branch of inferior mesenteric artery from right femoral approach 3. Aortogram and selective IMA arteriogram 4. Micro-bead embolization with 300-500  polyvinyl alcohol beads to the splenic flexure branch of the inferior mesenteric artery 5. StarClose closure device right femoral artery  I d/w case with dr. Lucky Cowboy. He is still seeing brick bleeding, pt seen in post procedure area, noted to have have hypotention. Grandaughter at bedside.    Review of Systems:   CONSTITUTIONAL: No documented fever. No fatigue, weakness. No weight gain, no weight loss.  EYES: No blurry or double vision.  ENT: No tinnitus. No postnasal drip. No redness of the oropharynx.  RESPIRATORY: No cough, no wheeze, no hemoptysis. No dyspnea.  CARDIOVASCULAR: No chest pain. No orthopnea. No palpitations. No syncope.  GASTROINTESTINAL: No nausea, no vomiting or positive diarrhea. No abdominal pain. Bright red blood per rectum GENITOURINARY: No dysuria or hematuria.  ENDOCRINE: No polyuria or nocturia. No heat or cold intolerance.  HEMATOLOGY: No anemia. No bruising. No bleeding.  INTEGUMENTARY: No rashes. No lesions.  MUSCULOSKELETAL: No arthritis. No swelling. No gout.  NEUROLOGIC: No numbness,  tingling, or ataxia. No seizure-type activity.  PSYCHIATRIC: No anxiety. No insomnia. No ADD.    Vitals:   Filed Vitals:   04/02/15 1340 04/02/15 1350 04/02/15 1353 04/02/15 1400  BP: 67/51 106/89 93/62 91/57   Pulse:      Temp:      TempSrc:      Resp: 21 17 16 20   Height:      Weight:      SpO2:    98%    Wt Readings from Last 3 Encounters:  04/02/15 97.433 kg (214 lb 12.8 oz)  03/25/15 108.863 kg (240 lb)  03/22/15 108.863 kg (240 lb)     Intake/Output Summary (Last 24 hours) at 04/02/15 1411 Last data filed at 04/02/15 5027  Gross  per 24 hour  Intake      0 ml  Output     10 ml  Net    -10 ml    Physical Exam:   GENERAL: Critically ill appearing HEAD, EYES, EARS, NOSE AND THROAT: Atraumatic, normocephalic. Extraocular muscles are intact. Pupils equal and reactive to light. Sclerae anicteric. No conjunctival injection. No oro-pharyngeal erythema.  NECK: Supple. There is no jugular venous distention. No bruits, no lymphadenopathy, no thyromegaly.  HEART: Regular rate and rhythm, tachycardic. No murmurs, no rubs, no clicks.  LUNGS: Clear to auscultation bilaterally. No rales or rhonchi. No wheezes.  ABDOMEN: Soft, flat, nontender, nondistended. Has good bowel sounds. No hepatosplenomegaly appreciated.  EXTREMITIES: No evidence of any cyanosis, clubbing, or peripheral edema.  +2 pedal and radial pulses bilaterally.  NEUROLOGIC: The patient is alert, awake, and oriented x3 with no focal motor or sensory deficits appreciated bilaterally.  SKIN: Moist and warm with no rashes appreciated.  Psych: Not anxious, depressed LN: No inguinal LN enlargement    Antibiotics   Anti-infectives    Start     Dose/Rate Route Frequency Ordered Stop   04/02/15 0945  ceFAZolin (ANCEF) IVPB 1 g/50 mL premix    Comments:  Send with pt to OR   1 g 100 mL/hr over 30 Minutes Intravenous On call 04/02/15 0936 04/02/15 1327      Medications   Scheduled Meds: . sodium chloride    Intravenous Once  . sodium chloride   Intravenous Once  . acetaminophen  650 mg Oral Once  . amLODipine  5 mg Oral Daily  . hydroxyurea  1,000 mg Oral Daily  . potassium chloride  20 mEq Oral Daily   Continuous Infusions: . sodium chloride 100 mL/hr at 04/02/15 1054  . norepinephrine (LEVOPHED) Adult infusion     PRN Meds:.ondansetron **OR** ondansetron (ZOFRAN) IV   Data Review:   Micro Results No results found for this or any previous visit (from the past 240 hour(s)).  Radiology Reports Nm Gi Blood Loss  04/02/2015   CLINICAL DATA:  Acute GI bleed.  EXAM: NUCLEAR MEDICINE GASTROINTESTINAL BLEEDING SCAN  TECHNIQUE: Sequential abdominal images were obtained following intravenous administration of Tc-64m labeled red blood cells.  RADIOPHARMACEUTICALS:  20 mCi Tc-65m in-vitro labeled red cells.  COMPARISON:  CT abdomen pelvis - 07/08/2014  FINDINGS: There is abnormal radiotracer accumulation overlying the expected location of the descending colon within the left mid hemi abdomen. This activity persists and peristalsis with subsequent acquired images.  Physiologic activity is seen within the heart and major abdominal vasculature.  IMPRESSION: Findings compatible with acute lower GI bleed with suspected area of bleeding within the proximal descending colon.  Critical Value/emergent results were called by telephone at the time of interpretation on 04/02/2015 at 3:05 am to Dr. Charlesetta Ivory , who verbally acknowledged these results.   Electronically Signed   By: Sandi Mariscal M.D.   On: 04/02/2015 03:07   US Venous Img Lower Unilateral Left  03/25/2015   CLINICAL DATA:  Left lower extremity pain for 1 day.  EXAM: Left LOWER EXTREMITY VENOUS DOPPLER ULTRASOUND  TECHNIQUE: Gray-scale sonography with graded compression, as well as color Doppler and duplex ultrasound were performed to evaluate the lower extremity deep venous systems from the level of the common femoral vein and including the common  femoral, femoral, profunda femoral, popliteal and calf veins including the posterior tibial, peroneal and gastrocnemius veins when visible. The superficial great saphenous vein was also interrogated. Spectral Doppler was  utilized to evaluate flow at rest and with distal augmentation maneuvers in the common femoral, femoral and popliteal veins.  COMPARISON:  None.  FINDINGS: Contralateral Common Femoral Vein: Respiratory phasicity is normal and symmetric with the symptomatic side. No evidence of thrombus. Normal compressibility.  Common Femoral Vein: No evidence of thrombus. Normal compressibility, respiratory phasicity and response to augmentation.  Saphenofemoral Junction: No evidence of thrombus. Normal compressibility and flow on color Doppler imaging.  Profunda Femoral Vein: No evidence of thrombus. Normal compressibility and flow on color Doppler imaging.  Femoral Vein: No evidence of thrombus. Normal compressibility, respiratory phasicity and response to augmentation.  Popliteal Vein: No evidence of thrombus. Normal compressibility, respiratory phasicity and response to augmentation.  Calf Veins: Posterior tibial and peroneal veins are not visualized.  Superficial Great Saphenous Vein: No evidence of thrombus. Normal compressibility and flow on color Doppler imaging.  Venous Reflux:  None.  Other Findings: Complex but predominantly hypoechoic abnormality is noted medially in the left calf which corresponds to palpable abnormality. It measures 5.1 x 2.2 x 3.6 cm and is most consistent with hematoma.  IMPRESSION: No definite evidence of deep venous thrombosis seen in left lower extremity, although calf veins are not visualized on this study. 5.1 cm complex hypoechoic abnormality is seen in the medial soft tissues of the left calf most consistent with hematoma ; but if there is no clinical evidence or history of traumatic injury, neoplasm cannot be excluded. MRI would be recommended in that setting.    Electronically Signed   By: Marijo Conception, M.D.   On: 03/25/2015 12:38   Dg Abd Acute W/chest  03/22/2015   CLINICAL DATA:  Left lower quadrant pain  EXAM: DG ABDOMEN ACUTE W/ 1V CHEST  COMPARISON:  None.  FINDINGS: The cardiac shadow is enlarged. The lungs are clear bilaterally. Degenerative changes of the shoulder joints are seen bilaterally. No free air is noted. Scattered large and small bowel gas is seen. Degenerative changes of lumbar spine are noted. Bilateral hip replacements are seen.  IMPRESSION: No acute abnormality noted.   Electronically Signed   By: Inez Catalina M.D.   On: 03/22/2015 16:53     CBC  Recent Labs Lab 04/01/15 2230 04/02/15 1149  WBC 4.8  --   HGB 12.9 10.8*  HCT 39.5  --   PLT 571*  --   MCV 103.3*  --   MCH 33.7  --   MCHC 32.6  --   RDW 16.4*  --   LYMPHSABS 0.6*  --   MONOABS 0.3  --   EOSABS 0.1  --   BASOSABS 0.0  --     Chemistries   Recent Labs Lab 04/01/15 2230  NA 137  K 4.3  CL 108  CO2 24  GLUCOSE 130*  BUN 19  CREATININE 0.83  CALCIUM 8.2*  AST 15  ALT 7*  ALKPHOS 73  BILITOT 0.7   ------------------------------------------------------------------------------------------------------------------ estimated creatinine clearance is 63.7 mL/min (by C-G formula based on Cr of 0.83). ------------------------------------------------------------------------------------------------------------------ No results for input(s): HGBA1C in the last 72 hours. ------------------------------------------------------------------------------------------------------------------ No results for input(s): CHOL, HDL, LDLCALC, TRIG, CHOLHDL, LDLDIRECT in the last 72 hours. ------------------------------------------------------------------------------------------------------------------ No results for input(s): TSH, T4TOTAL, T3FREE, THYROIDAB in the last 72 hours.  Invalid input(s):  FREET3 ------------------------------------------------------------------------------------------------------------------ No results for input(s): VITAMINB12, FOLATE, FERRITIN, TIBC, IRON, RETICCTPCT in the last 72 hours.  Coagulation profile  Recent Labs Lab 04/01/15 2230  INR 1.25    No results for input(s): DDIMER  in the last 72 hours.  Cardiac Enzymes No results for input(s): CKMB, TROPONINI, MYOGLOBIN in the last 168 hours.  Invalid input(s): CK ------------------------------------------------------------------------------------------------------------------ Invalid input(s): [place]   Assessment/Plan 1.   Acute lower GI bleed due to diverticular bleed: Patient blood pressures dropping at this point I will go ahead and continue the normal saline bolus. She will get 2 units of packed RBCs emergently. I will also start on Levophed. I have discussed the case with Dr. Felton Clinton who is on-call who will see the patient there is a chance that she may need hemicolectomy. Her granddaughter is at the bedside have explained the critical situation the patient currently is in.     Code Status Orders        Start     Ordered   04/02/15 0607  Full code   Continuous     04/02/15 0606    Advance Directive Documentation        Most Recent Value   Type of Advance Directive  Healthcare Power of Attorney   Pre-existing out of facility DNR order (yellow form or pink MOST form)     "MOST" Form in Place?        Family Communication: Granddaughter at the bedside  Disposition Plan: Home   Procedures bleeding scan   Consults  vascular   DVT Prophylaxis  SCDs   Lab Results  Component Value Date   PLT 571* 04/01/2015     Time Spent in minutes  50 minutes of critical care time, coordination of patient's care Dustin Flock M.D on 04/02/2015 at 2:11 PM  Between 7am to 6pm - Pager - [phone]  After 6pm go to [email or web address] - password [email or web address]  [facility]   Office  [phone]

## 2015-04-02 NOTE — Care Management (Signed)
Patient presents from [place].  She has been followed by [facility] in the past.  She presents with rectal bleeding and bleeding scan was positive.  She has had embolization procedure by vascular.

## 2015-04-02 NOTE — Progress Notes (Signed)
[facility] NOTE  Pharmacy Consult for Electrolyte Management  Indication: ICU Status   No Known Allergies  Patient Measurements: Height: 5\' 7"  (170.2 cm) Weight: 214 lb 12.8 oz (97.433 kg) IBW/kg (Calculated) : 61.6  Vital Signs: Temp: 97.7 F (36.5 C) (05/23 1900) Temp Source: Oral (05/23 1900) BP: 85/57 mmHg (05/23 2130) Pulse Rate: 86 (05/23 2130) Intake/Output from previous day: 05/22 0701 - 05/23 0700 In: -  Out: 10 [Urine:10] Intake/Output from this shift: Total I/O In: 1010 [I.V.:1010] Out: -  Vent settings for last 24 hours:    Labs:  Recent Labs  04/01/15 2230 04/02/15 1149 04/02/15 1410 04/02/15 1923  WBC 4.8  --   --   --   HGB 12.9 10.8* 8.8* 10.4*  HCT 39.5  --  27.3*  --   PLT 571*  --   --   --   INR 1.25  --   --   --   CREATININE 0.83  --   --   --   ALBUMIN 3.1*  --   --   --   PROT 6.4*  --   --   --   AST 15  --   --   --   ALT 7*  --   --   --   ALKPHOS 73  --   --   --   BILITOT 0.7  --   --   --    Estimated Creatinine Clearance: 63.7 mL/min (by C-G formula based on Cr of 0.83).  No results for input(s): GLUCAP in the last 72 hours.  Microbiology: No results found for this or any previous visit (from the past 720 hour(s)).  Medications:  Scheduled:   Infusions:  . sodium chloride 125 mL/hr at 04/02/15 2000  . norepinephrine      Assessment: 81 yo female admitted to ICU with  GI bleed and requiring pressors.    Plan:  Electrolytes WNL, will follow-up with am labs.    Pharmacy will continue to monitor and adjust per consult.    Ernest Popowski L 04/02/2015,10:05 PM

## 2015-04-02 NOTE — Progress Notes (Signed)
[place] [facility]                                                                                                                                                                                            Patient Demographics   Yolanda Wells, is a 81 y.o. female, DOB - 1932-05-17, WER:[id number]  Admit date - 04/01/2015   Admitting Physician Harrie Foreman, MD  Outpatient Primary MD for the patient is No primary care provider on file.  LOS - 0  Chief Complaint  Patient presents with  . Rectal Bleeding      Patient presented with multiple episodes of bright red blood. She had a bleeding scan in the ER that was positive. She did have another episode earlier today of bleeding. She denies any chest pain or shortness of breath currently.  Review of Systems:   CONSTITUTIONAL: No documented fever. No fatigue, weakness. No weight gain, no weight loss.  EYES: No blurry or double vision.  ENT: No tinnitus. No postnasal drip. No redness of the oropharynx.  RESPIRATORY: No cough, no wheeze, no hemoptysis. No dyspnea.  CARDIOVASCULAR: No chest pain. No orthopnea. No palpitations. No syncope.  GASTROINTESTINAL: No nausea, no vomiting or positive diarrhea. No abdominal pain. Bright red blood per rectum GENITOURINARY: No dysuria or hematuria.  ENDOCRINE: No polyuria or nocturia. No heat or cold intolerance.  HEMATOLOGY: No anemia. No bruising. No bleeding.  INTEGUMENTARY: No rashes. No lesions.  MUSCULOSKELETAL: No arthritis. No swelling. No gout.  NEUROLOGIC: No numbness, tingling, or ataxia. No seizure-type activity.  PSYCHIATRIC: No anxiety. No insomnia. No ADD.    Vitals:   Filed Vitals:   04/02/15 0400 04/02/15 0532 04/02/15 0609 04/02/15 1042  BP: 110/60 96/68  107/61  Pulse:  81  83  Temp:  97.7 F (36.5 C)  97.8 F (36.6 C)  TempSrc:  Oral    Resp: 18   19  Height:   5\' 7"  (1.702 m)   Weight:   97.433 kg (214 lb 12.8 oz)   SpO2:   99%  99%    Wt Readings from Last 3 Encounters:  04/02/15 97.433 kg (214 lb 12.8 oz)  03/25/15 108.863 kg (240 lb)  03/22/15 108.863 kg (240 lb)     Intake/Output Summary (Last 24 hours) at 04/02/15 1155 Last data filed at 04/02/15 0420  Gross per 24 hour  Intake      0 ml  Output     10 ml  Net    -10 ml    Physical Exam:   GENERAL: Pleasant-appearing in no apparent distress.  HEAD, EYES, EARS, NOSE AND THROAT: Atraumatic, normocephalic. Extraocular muscles are intact. Pupils equal and reactive to light. Sclerae anicteric. No conjunctival injection. No oro-pharyngeal erythema.  NECK: Supple. There is no jugular venous distention. No bruits, no lymphadenopathy, no thyromegaly.  HEART: Regular rate and rhythm, tachycardic. No murmurs, no rubs, no clicks.  LUNGS: Clear to auscultation bilaterally. No rales or rhonchi. No wheezes.  ABDOMEN: Soft, flat, nontender, nondistended. Has good bowel sounds. No hepatosplenomegaly appreciated.  EXTREMITIES: No evidence of any cyanosis, clubbing, or peripheral edema.  +2 pedal and radial pulses bilaterally.  NEUROLOGIC: The patient is alert, awake, and oriented x3 with no focal motor or sensory deficits appreciated bilaterally.  SKIN: Moist and warm with no rashes appreciated.  Psych: Not anxious, depressed LN: No inguinal LN enlargement    Antibiotics   Anti-infectives    Start     Dose/Rate Route Frequency Ordered Stop   04/02/15 0945  ceFAZolin (ANCEF) IVPB 1 g/50 mL premix    Comments:  Send with pt to OR   1 g 100 mL/hr over 30 Minutes Intravenous On call 04/02/15 0936 04/03/15 0945      Medications   Scheduled Meds: . amLODipine  5 mg Oral Daily  .  ceFAZolin (ANCEF) IV  1 g Intravenous On Call  . hydroxyurea  1,000 mg Oral Daily  . potassium chloride  20 mEq Oral Daily   Continuous Infusions: . sodium chloride 100 mL/hr at 04/02/15 1054   PRN Meds:.ondansetron **OR** ondansetron (ZOFRAN) IV   Data Review:   Micro  Results No results found for this or any previous visit (from the past 240 hour(s)).  Radiology Reports Nm Gi Blood Loss  04/02/2015   CLINICAL DATA:  Acute GI bleed.  EXAM: NUCLEAR MEDICINE GASTROINTESTINAL BLEEDING SCAN  TECHNIQUE: Sequential abdominal images were obtained following intravenous administration of Tc-84m labeled red blood cells.  RADIOPHARMACEUTICALS:  20 mCi Tc-48m in-vitro labeled red cells.  COMPARISON:  CT abdomen pelvis - 07/08/2014  FINDINGS: There is abnormal radiotracer accumulation overlying the expected location of the descending colon within the left mid hemi abdomen. This activity persists and peristalsis with subsequent acquired images.  Physiologic activity is seen within the heart and major abdominal vasculature.  IMPRESSION: Findings compatible with acute lower GI bleed with suspected area of bleeding within the proximal descending colon.  Critical Value/emergent results were called by telephone at the time of interpretation on 04/02/2015 at 3:05 am to Dr. Charlesetta Ivory , who verbally acknowledged these results.   Electronically Signed   By: Sandi Mariscal M.D.   On: 04/02/2015 03:07   US Venous Img Lower Unilateral Left  03/25/2015   CLINICAL DATA:  Left lower extremity pain for 1 day.  EXAM: Left LOWER EXTREMITY VENOUS DOPPLER ULTRASOUND  TECHNIQUE: Gray-scale sonography with graded compression, as well as color Doppler and duplex ultrasound were performed to evaluate the lower extremity deep venous systems from the level of the common femoral vein and including the common femoral, femoral, profunda femoral, popliteal and calf veins including the posterior tibial, peroneal and gastrocnemius veins when visible. The superficial great saphenous vein was also interrogated. Spectral Doppler was utilized to evaluate flow at rest and with distal augmentation maneuvers in the common femoral, femoral and popliteal veins.  COMPARISON:  None.  FINDINGS: Contralateral Common Femoral  Vein: Respiratory phasicity is normal and symmetric with the symptomatic side. No evidence of thrombus. Normal compressibility.  Common Femoral Vein: No evidence of thrombus. Normal compressibility, respiratory phasicity and  response to augmentation.  Saphenofemoral Junction: No evidence of thrombus. Normal compressibility and flow on color Doppler imaging.  Profunda Femoral Vein: No evidence of thrombus. Normal compressibility and flow on color Doppler imaging.  Femoral Vein: No evidence of thrombus. Normal compressibility, respiratory phasicity and response to augmentation.  Popliteal Vein: No evidence of thrombus. Normal compressibility, respiratory phasicity and response to augmentation.  Calf Veins: Posterior tibial and peroneal veins are not visualized.  Superficial Great Saphenous Vein: No evidence of thrombus. Normal compressibility and flow on color Doppler imaging.  Venous Reflux:  None.  Other Findings: Complex but predominantly hypoechoic abnormality is noted medially in the left calf which corresponds to palpable abnormality. It measures 5.1 x 2.2 x 3.6 cm and is most consistent with hematoma.  IMPRESSION: No definite evidence of deep venous thrombosis seen in left lower extremity, although calf veins are not visualized on this study. 5.1 cm complex hypoechoic abnormality is seen in the medial soft tissues of the left calf most consistent with hematoma ; but if there is no clinical evidence or history of traumatic injury, neoplasm cannot be excluded. MRI would be recommended in that setting.   Electronically Signed   By: Marijo Conception, M.D.   On: 03/25/2015 12:38   Dg Abd Acute W/chest  03/22/2015   CLINICAL DATA:  Left lower quadrant pain  EXAM: DG ABDOMEN ACUTE W/ 1V CHEST  COMPARISON:  None.  FINDINGS: The cardiac shadow is enlarged. The lungs are clear bilaterally. Degenerative changes of the shoulder joints are seen bilaterally. No free air is noted. Scattered large and small bowel gas is  seen. Degenerative changes of lumbar spine are noted. Bilateral hip replacements are seen.  IMPRESSION: No acute abnormality noted.   Electronically Signed   By: Inez Catalina M.D.   On: 03/22/2015 16:53     CBC  Recent Labs Lab 04/01/15 2230  WBC 4.8  HGB 12.9  HCT 39.5  PLT 571*  MCV 103.3*  MCH 33.7  MCHC 32.6  RDW 16.4*  LYMPHSABS 0.6*  MONOABS 0.3  EOSABS 0.1  BASOSABS 0.0    Chemistries   Recent Labs Lab 04/01/15 2230  NA 137  K 4.3  CL 108  CO2 24  GLUCOSE 130*  BUN 19  CREATININE 0.83  CALCIUM 8.2*  AST 15  ALT 7*  ALKPHOS 73  BILITOT 0.7   ------------------------------------------------------------------------------------------------------------------ estimated creatinine clearance is 63.7 mL/min (by C-G formula based on Cr of 0.83). ------------------------------------------------------------------------------------------------------------------ No results for input(s): HGBA1C in the last 72 hours. ------------------------------------------------------------------------------------------------------------------ No results for input(s): CHOL, HDL, LDLCALC, TRIG, CHOLHDL, LDLDIRECT in the last 72 hours. ------------------------------------------------------------------------------------------------------------------ No results for input(s): TSH, T4TOTAL, T3FREE, THYROIDAB in the last 72 hours.  Invalid input(s): FREET3 ------------------------------------------------------------------------------------------------------------------ No results for input(s): VITAMINB12, FOLATE, FERRITIN, TIBC, IRON, RETICCTPCT in the last 72 hours.  Coagulation profile  Recent Labs Lab 04/01/15 2230  INR 1.25    No results for input(s): DDIMER in the last 72 hours.  Cardiac Enzymes No results for input(s): CKMB, TROPONINI, MYOGLOBIN in the last 168 hours.  Invalid input(s):  CK ------------------------------------------------------------------------------------------------------------------ Invalid input(s): [place]   Assessment/Plan This is an 81 year old female with a lower GI bleed. 1. Lower GI bleed: Suspected due to diverticular bleed, she has a bleeding scan that's already positive and continues to have bleeding. I have discussed the case with Dr. dew who will see the patient and likely taken to the OR for vascular intervention. I have discussed  with the patient is a risk and benefits of transfusion she states that if needed. Blood she will be willing to take blood. 2. Polycythemia: The patient has been on hydroxyurea but is starting a radioactive treatment called P 32. MCV is elevated as expected with hydroxyurea therapy 3. Hypertension: Continue amlodipine  4. DVT prophylaxis: SCDs 5. GI prophylaxis: IV Nexium     Code Status Orders        Start     Ordered   04/02/15 0607  Full code   Continuous     04/02/15 0606    Advance Directive Documentation        Most Recent Value   Type of Advance Directive  Healthcare Power of Attorney   Pre-existing out of facility DNR order (yellow form or pink MOST form)     "MOST" Form in Place?        Family Communication: Granddaughter at the bedside  Disposition Plan: Home   Procedures bleeding scan   Consults  vascular   DVT Prophylaxis  SCDs   Lab Results  Component Value Date   PLT 571* 04/01/2015     Time Spent in minutes   45 minutes   Dustin Flock M.D on 04/02/2015 at 11:55 AM  Between 7am to 6pm - Pager - [phone]  After 6pm go to [email or web address] - password [email or web address] [facility]   Office  [phone]

## 2015-04-02 NOTE — ED Notes (Signed)
Pt taken to admission room via stretcher by ED tech

## 2015-04-02 NOTE — Progress Notes (Signed)
I have updated dr. Marina Gravel regarding continous bleeding and patients tranfer to ICU.

## 2015-04-02 NOTE — H&P (Signed)
Yolanda Wells is an 81 y.o. female.   Chief Complaint: GI bleed HPI: The patient presents emergency department after a painless grossly bloody bowel movements. She states that she has not had any nausea or vomiting diarrhea chest pain or shortness of breath. She denies dizziness but was concerned about the amount of blood with her stool. This is the first time she has had this degree of bleeding. She has a history of diverticular colitis. She underwent a tagged red blood cell scan which showed bleeding at the splenic flexure which prompted emergency department to call for admission.  Past Medical History  Diagnosis Date  . Hypertension   . Polycythemia vera(238.4)   . Deep venous thrombosis     right lower extremity  . History of hysterectomy   . History of bilateral hip replacements     Past Surgical History  Procedure Laterality Date  . Toe amputation      right  . Joint replacement      Left and Right Hip  . Abdominal hysterectomy      Family History  Problem Relation Age of Onset  . Heart attack Father   . Hypertension    . Arthritis-Osteo     Social History:  reports that she has never smoked. She does not have any smokeless tobacco history on file. She reports that she does not drink alcohol or use illicit drugs.  Allergies: No Known Allergies  Medications Prior to Admission  Medication Sig Dispense Refill  . amLODipine (NORVASC) 5 MG tablet Take 5 mg by mouth daily.    [name] aspirin 81 MG tablet Take 81 mg by mouth daily.    . hydroxyurea (HYDREA) 500 MG capsule Take 1,000 mg by mouth daily. May take with food to minimize GI side effects.    . naproxen (NAPROSYN) 375 MG tablet Take 1 tablet (375 mg total) by mouth 2 (two) times daily with a meal. 20 tablet 2  . potassium chloride (KLOR-CON) 20 MEQ packet Take 20 mEq by mouth daily.      Results for orders placed or performed during the hospital encounter of 04/01/15 (from the past 48 hour(s))  CBC with Differential      Status: Abnormal   Collection Time: 04/01/15 10:30 PM  Result Value Ref Range   WBC 4.8 3.6 - 11.0 K/uL   RBC 3.83 3.80 - 5.20 MIL/uL   Hemoglobin 12.9 12.0 - 16.0 g/dL   HCT 39.5 35.0 - 47.0 %   MCV 103.3 (H) 80.0 - 100.0 fL   MCH 33.7 26.0 - 34.0 pg   MCHC 32.6 32.0 - 36.0 g/dL   RDW 16.4 (H) 11.5 - 14.5 %   Platelets 571 (H) 150 - 440 K/uL   Neutrophils Relative % 79 %   Neutro Abs 3.8 1.4 - 6.5 K/uL   Lymphocytes Relative 13 %   Lymphs Abs 0.6 (L) 1.0 - 3.6 K/uL   Monocytes Relative 6 %   Monocytes Absolute 0.3 0.2 - 0.9 K/uL   Eosinophils Relative 1 %   Eosinophils Absolute 0.1 0 - 0.7 K/uL   Basophils Relative 1 %   Basophils Absolute 0.0 0 - 0.1 K/uL  Comprehensive metabolic panel     Status: Abnormal   Collection Time: 04/01/15 10:30 PM  Result Value Ref Range   Sodium 137 135 - 145 mmol/L   Potassium 4.3 3.5 - 5.1 mmol/L   Chloride 108 101 - 111 mmol/L   CO2 24 22 - 32 mmol/L  Glucose, Bld 130 (H) 65 - 99 mg/dL   BUN 19 6 - 20 mg/dL   Creatinine, Ser 0.83 0.44 - 1.00 mg/dL   Calcium 8.2 (L) 8.9 - 10.3 mg/dL   Total Protein 6.4 (L) 6.5 - 8.1 g/dL   Albumin 3.1 (L) 3.5 - 5.0 g/dL   AST 15 15 - 41 U/L   ALT 7 (L) 14 - 54 U/L   Alkaline Phosphatase 73 38 - 126 U/L   Total Bilirubin 0.7 0.3 - 1.2 mg/dL   GFR calc non Af Amer >60 >60 mL/min   GFR calc Af Amer >60 >60 mL/min    Comment: (NOTE) The eGFR has been calculated using the CKD EPI equation. This calculation has not been validated in all clinical situations. eGFR's persistently <60 mL/min signify possible Chronic Kidney Disease.    Anion gap 5 5 - 15  Protime-INR     Status: Abnormal   Collection Time: 04/01/15 10:30 PM  Result Value Ref Range   Prothrombin Time 15.9 (H) 11.4 - 15.0 seconds   INR 1.25   Urinalysis complete, with microscopic     Status: Abnormal   Collection Time: 04/02/15  4:20 AM  Result Value Ref Range   Color, Urine AMBER (A) YELLOW   APPearance CLEAR (A) CLEAR   Glucose, UA  NEGATIVE NEGATIVE mg/dL   Bilirubin Urine NEGATIVE NEGATIVE   Ketones, ur NEGATIVE NEGATIVE mg/dL   Specific Gravity, Urine 1.029 1.005 - 1.030   Hgb urine dipstick NEGATIVE NEGATIVE   pH 5.0 5.0 - 8.0   Protein, ur 30 (A) NEGATIVE mg/dL   Nitrite NEGATIVE NEGATIVE   Leukocytes, UA NEGATIVE NEGATIVE   RBC / HPF 0-5 0 - 5 RBC/hpf   WBC, UA 0-5 0 - 5 WBC/hpf   Bacteria, UA NONE SEEN NONE SEEN   Squamous Epithelial / LPF 0-5 (A) NONE SEEN   Mucous PRESENT    Hyaline Casts, UA PRESENT    Nm Gi Blood Loss  04/02/2015   CLINICAL DATA:  Acute GI bleed.  EXAM: NUCLEAR MEDICINE GASTROINTESTINAL BLEEDING SCAN  TECHNIQUE: Sequential abdominal images were obtained following intravenous administration of Tc-93m labeled red blood cells.  RADIOPHARMACEUTICALS:  20 mCi Tc-23m in-vitro labeled red cells.  COMPARISON:  CT abdomen pelvis - 07/08/2014  FINDINGS: There is abnormal radiotracer accumulation overlying the expected location of the descending colon within the left mid hemi abdomen. This activity persists and peristalsis with subsequent acquired images.  Physiologic activity is seen within the heart and major abdominal vasculature.  IMPRESSION: Findings compatible with acute lower GI bleed with suspected area of bleeding within the proximal descending colon.  Critical Value/emergent results were called by telephone at the time of interpretation on 04/02/2015 at 3:05 am to Dr. Charlesetta Ivory , who verbally acknowledged these results.   Electronically Signed   By: Sandi Mariscal M.D.   On: 04/02/2015 03:07    Review of Systems  Constitutional: Negative for fever and chills.  HENT: Negative for sore throat and tinnitus.   Eyes: Negative for blurred vision and redness.  Respiratory: Negative for cough and shortness of breath.   Cardiovascular: Negative for chest pain, palpitations, orthopnea and PND.  Gastrointestinal: Negative for nausea, vomiting, abdominal pain and diarrhea.  Genitourinary: Negative for  dysuria, urgency and frequency.  Musculoskeletal: Negative for myalgias and joint pain.  Skin: Negative for rash.       No lesions  Neurological: Negative for speech change, focal weakness and weakness.  Endo/Heme/Allergies: Does not  bruise/bleed easily.       No temperature intolerance  Psychiatric/Behavioral: Negative for depression and suicidal ideas.    Blood pressure 96/68, pulse 81, temperature 97.7 F (36.5 C), temperature source Oral, resp. rate 18, height $RemoveBe'5\' 7"'XGGoITmon$  (1.702 m), weight 97.433 kg (214 lb 12.8 oz), SpO2 99 %. Physical Exam  Vitals reviewed. Constitutional: She is oriented to person, place, and time. She appears well-developed and well-nourished.  HENT:  Head: Normocephalic and atraumatic.  Eyes: EOM are normal. Pupils are equal, round, and reactive to light.  Neck: Normal range of motion. No tracheal deviation present. No thyromegaly present.  Cardiovascular: Normal rate, regular rhythm and normal heart sounds.  Exam reveals no gallop and no friction rub.   No murmur heard. Respiratory: Effort normal and breath sounds normal.  GI: Soft. Bowel sounds are normal. She exhibits no distension. There is no tenderness.  Musculoskeletal: Normal range of motion. She exhibits edema. She exhibits no tenderness.  Trace LE edema R transmetatarsal amputation  Lymphadenopathy:    She has no cervical adenopathy.  Neurological: She is alert and oriented to person, place, and time. No cranial nerve deficit. She exhibits normal muscle tone.  Skin: Skin is warm and dry.  Psychiatric: She has a normal mood and affect. Judgment and thought content normal.     Assessment/Plan This is an 81 year old female with a lower GI bleed. 1. GI bleed: The patient is hemodynamically stable. Her hemoglobin is within normal limits. We will admit her for GI evaluation. 2. Polycythemia: The patient has been on hydroxyurea but is starting a radioactive treatment called P 32. MCV is elevated as expected  with hydroxyurea therapy 3. Hypertension: Continue amlodipine  4. DVT prophylaxis: SCDs 5. GI prophylaxis: IV Nexium The patient is a full code. Time spent on admission orders and patient care is approximately 35 minutes  Yolanda Wells 04/02/2015, 7:01 AM

## 2015-04-02 NOTE — Progress Notes (Signed)
Went to see patient multiple time but was down in angiography and was unable to see the patient.

## 2015-04-02 NOTE — ED Notes (Addendum)
Pt uprite on stretcher in exam room with no distress noted; denies c/o pain; pt incont large amount dark red clots; pt clensed and attends/linen changed; Dr Marcille Blanco notified; due to pt's incontinence of rectal bleeding, I&O cath will be performed for UA ordered; pt reports that this is 3rd incident since here of bleeding

## 2015-04-02 NOTE — Op Note (Signed)
[facility]   OPERATIVE NOTE    PRE-OPERATIVE DIAGNOSIS: Brisk lower GI bleeding with positive bleeding scan  POST-OPERATIVE DIAGNOSIS: Same as above  PROCEDURE: 1.   Ultrasound guidance for vascular access right femoral artery 2.   Catheter placement into splenic flexure branch of inferior mesenteric artery from right femoral approach 3.   Aortogram and selective IMA arteriogram 4.   Micro-bead embolization with 300-500  polyvinyl alcohol beads to the splenic flexure branch of the inferior mesenteric artery 5.   StarClose closure device right femoral artery  SURGEON: Leotis Pain, MD  ASSISTANT(S): None  ANESTHESIA: Local with sedation  ESTIMATED BLOOD LOSS: Minimal   FINDING(S): 1.  Brisk bleeding from the splenic flexure improved with embolization  SPECIMEN(S):  None  INDICATIONS:   Yolanda Wells is a 81 y.o. female who presents with brisk lower GI bleeding. She had a positive bleeding scan suggesting bleeding coming from the splenic flexure region. Risks and benefits were discussed and she was agreeable to proceed.  DESCRIPTION: After obtaining full informed written consent, the patient was brought back to the vascular suite and placed supine upon the operating table.  The patient received IV antibiotics prior to induction.  After obtaining adequate anesthesia, the patient was prepped and draped in the standard fashion.  The right femoral artery was visualized with ultrasound and found to be widely patent. It was accessed under direct ultrasound guidance without difficulty with a Seldinger needle. A J-wire and 5 French sheath were placed, and a permanent image was recorded from ultrasound. Pigtail catheter was placed in the aorta at the L1-L2 level then pulled down to the mid infrarenal aorta and an RAO projection arteriogram was performed. The origin of the IMA was visualized and then cannulated with a VS 1 catheter. There were several branches off of the  main IMA including a superior branch going directly towards the splenic flexure. I cannulated this branch going to the splenic flexure with a pro-great microcatheter and did selective imaging. This demonstrated a brisk bleeding source from what appeared to be a diverticulum near the splenic flexure. At this point I instilled about 4 cc of 300-500  polyvinyl alcohol beads directly into the splenic flexure branch. This resulted in improvement but not complete resolution of the blush. This was a reasonably large amount of polyvinyl alcohol beads, and I feared putting any further material and may result in ischemia of her left colon so at this point I terminated the procedure. The diagnostic catheter was removed. Oblique arteriogram was performed of the right femoral artery and StarClose closure device was deployed in the usual fashion with excellent hemostatic result. The patient was taken to the recovery room in stable condition having tolerated the procedure well.  COMPLICATIONS: None  CONDITION: Stable  Yolanda Wells  04/02/2015, 1:24 PM

## 2015-04-02 NOTE — ED Notes (Signed)
Pt to nuclear med for imaging via stretcher

## 2015-04-02 NOTE — ED Notes (Signed)
Dr. Diamond in to see pt.  

## 2015-04-03 ENCOUNTER — Inpatient Hospital Stay: Payer: Medicare Other | Admitting: Anesthesiology

## 2015-04-03 ENCOUNTER — Encounter
Admission: EM | Disposition: A | Discharge status: Skilled Nursing Facility | Payer: Self-pay | Source: Home / Self Care | Attending: Internal Medicine

## 2015-04-03 ENCOUNTER — Encounter: Payer: Self-pay | Admitting: Vascular Surgery

## 2015-04-03 HISTORY — PX: CARDIAC CATHETERIZATION: SHX172

## 2015-04-03 HISTORY — PX: COLECTOMY WITH COLOSTOMY CREATION/HARTMANN PROCEDURE: SHX6598

## 2015-04-03 LAB — CBC
HCT: 27.4 % — ABNORMAL LOW (ref 35.0–47.0)
HEMATOCRIT: 32.5 % — AB (ref 35.0–47.0)
Hemoglobin: 9.1 g/dL — ABNORMAL LOW (ref 12.0–16.0)
Hemoglobin: 10.8 g/dL — ABNORMAL LOW (ref 12.0–16.0)
MCH: 33.6 pg (ref 26.0–34.0)
MCH: 30.3 pg (ref 26.0–34.0)
MCHC: 33.1 g/dL (ref 32.0–36.0)
MCHC: 33.3 g/dL (ref 32.0–36.0)
MCV: 101.4 fL — ABNORMAL HIGH (ref 80.0–100.0)
MCV: 90.9 fL (ref 80.0–100.0)
PLATELETS: 608 10*3/uL — AB (ref 150–440)
Platelets: 557 10*3/uL — ABNORMAL HIGH (ref 150–440)
RBC: 2.7 MIL/uL — ABNORMAL LOW (ref 3.80–5.20)
RBC: 3.57 MIL/uL — ABNORMAL LOW (ref 3.80–5.20)
RDW: 21.6 % — AB (ref 11.5–14.5)
RDW: 28.2 % — AB (ref 11.5–14.5)
WBC: 13 10*3/uL — ABNORMAL HIGH (ref 3.6–11.0)
WBC: 14.9 10*3/uL — ABNORMAL HIGH (ref 3.6–11.0)

## 2015-04-03 LAB — BASIC METABOLIC PANEL
Anion gap: 3 — ABNORMAL LOW (ref 5–15)
BUN: 15 mg/dL (ref 6–20)
CHLORIDE: 114 mmol/L — AB (ref 101–111)
CO2: 23 mmol/L (ref 22–32)
Calcium: 7.3 mg/dL — ABNORMAL LOW (ref 8.9–10.3)
Creatinine, Ser: 0.56 mg/dL (ref 0.44–1.00)
Glucose, Bld: 128 mg/dL — ABNORMAL HIGH (ref 65–99)
POTASSIUM: 3.8 mmol/L (ref 3.5–5.1)
Sodium: 140 mmol/L (ref 135–145)

## 2015-04-03 LAB — PREPARE RBC (CROSSMATCH)

## 2015-04-03 LAB — PHOSPHORUS: Phosphorus: 3.4 mg/dL (ref 2.5–4.6)

## 2015-04-03 LAB — PROTIME-INR
INR: 1.34
Prothrombin Time: 16.8 seconds — ABNORMAL HIGH (ref 11.4–15.0)

## 2015-04-03 LAB — GLUCOSE, CAPILLARY
Glucose-Capillary: 102 mg/dL — ABNORMAL HIGH (ref 65–99)
Glucose-Capillary: 130 mg/dL — ABNORMAL HIGH (ref 65–99)
Glucose-Capillary: 137 mg/dL — ABNORMAL HIGH (ref 65–99)

## 2015-04-03 LAB — HEMOGLOBIN
Hemoglobin: 10.5 g/dL — ABNORMAL LOW (ref 12.0–16.0)
Hemoglobin: 8.8 g/dL — ABNORMAL LOW (ref 12.0–16.0)

## 2015-04-03 LAB — APTT: aPTT: 36 seconds (ref 24–36)

## 2015-04-03 LAB — MAGNESIUM: MAGNESIUM: 1.6 mg/dL — AB (ref 1.7–2.4)

## 2015-04-03 SURGERY — COLECTOMY, WITH COLOSTOMY CREATION
Anesthesia: General

## 2015-04-03 SURGERY — CENTRAL LINE INSERTION
Anesthesia: General | Site: Groin | Laterality: Right | Wound class: Clean

## 2015-04-03 MED ORDER — ONDANSETRON HCL 4 MG/2ML IJ SOLN: 4.0000 mg | Freq: Once | INTRAMUSCULAR | Status: AC | PRN

## 2015-04-03 MED ORDER — LACTATED RINGERS IV SOLN
INTRAVENOUS | Status: DC | PRN
  Administered 2015-04-03: 10:00:00 via INTRAVENOUS

## 2015-04-03 MED ORDER — SUCCINYLCHOLINE CHLORIDE 20 MG/ML IJ SOLN
INTRAMUSCULAR | Status: DC | PRN
  Administered 2015-04-03: 100 mg via INTRAVENOUS

## 2015-04-03 MED ORDER — MIDAZOLAM HCL 5 MG/5ML IJ SOLN
INTRAMUSCULAR | Status: DC | PRN
  Administered 2015-04-03 (×2): 1 mg via INTRAVENOUS

## 2015-04-03 MED ORDER — ETOMIDATE 2 MG/ML IV SOLN
INTRAVENOUS | Status: DC | PRN
  Administered 2015-04-03: 18 mg via INTRAVENOUS

## 2015-04-03 MED ORDER — ROCURONIUM BROMIDE 100 MG/10ML IV SOLN
INTRAVENOUS | Status: DC | PRN
  Administered 2015-04-03: 30 mg via INTRAVENOUS
  Administered 2015-04-03 (×2): 10 mg via INTRAVENOUS

## 2015-04-03 MED ORDER — LIDOCAINE HCL (CARDIAC) 20 MG/ML IV SOLN
INTRAVENOUS | Status: DC | PRN
  Administered 2015-04-03: 40 mg via INTRAVENOUS

## 2015-04-03 MED ORDER — MAGNESIUM SULFATE 2 GM/50ML IV SOLN
2.0000 g | Freq: Once | INTRAVENOUS | Status: DC
  Administered 2015-04-03: 2 g via INTRAVENOUS
  Filled 2015-04-03: qty 50

## 2015-04-03 MED ORDER — GLYCOPYRROLATE 0.2 MG/ML IJ SOLN
INTRAMUSCULAR | Status: DC | PRN
  Administered 2015-04-03: 0.6 mg via INTRAVENOUS

## 2015-04-03 MED ORDER — DEXAMETHASONE SODIUM PHOSPHATE 10 MG/ML IJ SOLN
INTRAMUSCULAR | Status: DC | PRN
  Administered 2015-04-03: 10 mg via INTRAVENOUS

## 2015-04-03 MED ORDER — LIDOCAINE HCL (CARDIAC) 20 MG/ML IV SOLN: INTRAVENOUS | Status: DC | PRN

## 2015-04-03 MED ORDER — EPHEDRINE SULFATE 50 MG/ML IJ SOLN
INTRAMUSCULAR | Status: DC | PRN
  Administered 2015-04-03 (×2): 10 mg via INTRAVENOUS

## 2015-04-03 MED ORDER — NEOSTIGMINE METHYLSULFATE 10 MG/10ML IV SOLN
INTRAVENOUS | Status: DC | PRN
  Administered 2015-04-03: 4 mg via INTRAVENOUS

## 2015-04-03 MED ORDER — PHENYLEPHRINE HCL 10 MG/ML IJ SOLN
INTRAMUSCULAR | Status: DC | PRN
  Administered 2015-04-03 (×3): 100 ug via INTRAVENOUS

## 2015-04-03 MED ORDER — ETOMIDATE 2 MG/ML IV SOLN: INTRAVENOUS | Status: DC | PRN

## 2015-04-03 MED ORDER — FENTANYL CITRATE (PF) 250 MCG/5ML IJ SOLN
INTRAMUSCULAR | Status: DC | PRN
  Administered 2015-04-03 (×3): 50 ug via INTRAVENOUS

## 2015-04-03 MED ORDER — ONDANSETRON HCL 4 MG/2ML IJ SOLN
INTRAMUSCULAR | Status: DC | PRN
  Administered 2015-04-03: 4 mg via INTRAVENOUS

## 2015-04-03 MED ORDER — MORPHINE SULFATE 2 MG/ML IJ SOLN
2.0000 mg | INTRAMUSCULAR | Status: DC | PRN
  Administered 2015-04-03 – 2015-04-04 (×3): 2 mg via INTRAVENOUS
  Filled 2015-04-03 (×3): qty 1

## 2015-04-03 MED ORDER — FENTANYL CITRATE (PF) 100 MCG/2ML IJ SOLN
25.0000 ug | INTRAMUSCULAR | Status: DC | PRN
  Administered 2015-04-03 (×3): 25 ug via INTRAVENOUS

## 2015-04-03 MED ORDER — SODIUM CHLORIDE 0.9 % IV SOLN
Freq: Once | INTRAVENOUS | Status: AC
  Administered 2015-04-03: 09:00:00 via INTRAVENOUS

## 2015-04-03 MED ORDER — SODIUM CHLORIDE 0.9 % IV SOLN
1.0000 g | Freq: Once | INTRAVENOUS | Status: AC
  Administered 2015-04-03: 1 g via INTRAVENOUS
  Filled 2015-04-03: qty 1

## 2015-04-03 SURGICAL SUPPLY — 39 items
BAG URO DRAIN 2000ML W/SPOUT (MISCELLANEOUS) ×3 IMPLANT
CANISTER SUCT 1200ML W/VALVE (MISCELLANEOUS) ×3 IMPLANT
CATH TRAY 16F METER LATEX (MISCELLANEOUS) ×3 IMPLANT
CHLORAPREP W/TINT 26ML (MISCELLANEOUS) ×3 IMPLANT
CLOSURE WOUND 1/2 X4 (GAUZE/BANDAGES/DRESSINGS) ×2
DRAPE LAPAROTOMY 100X77 ABD (DRAPES) ×3 IMPLANT
DRAPE UTILITY 15X26 TOWEL STRL (DRAPES) ×6 IMPLANT
DRSG OPSITE POSTOP 4X10 (GAUZE/BANDAGES/DRESSINGS) ×3 IMPLANT
DRSG OPSITE POSTOP 4X8 (GAUZE/BANDAGES/DRESSINGS) ×3 IMPLANT
ELECT CAUTERY BLADE 6.4 (BLADE) ×3 IMPLANT
GAUZE PETRO XEROFOAM 1X8 (MISCELLANEOUS) ×3 IMPLANT
GAUZE SPONGE 4X4 12PLY STRL (GAUZE/BANDAGES/DRESSINGS) ×3 IMPLANT
GLOVE BIO SURGEON STRL SZ7.5 (GLOVE) ×12 IMPLANT
GOWN STRL REUS W/ TWL LRG LVL3 (GOWN DISPOSABLE) ×4 IMPLANT
GOWN STRL REUS W/ TWL XL LVL3 (GOWN DISPOSABLE) ×2 IMPLANT
GOWN STRL REUS W/TWL LRG LVL3 (GOWN DISPOSABLE) ×8
GOWN STRL REUS W/TWL XL LVL3 (GOWN DISPOSABLE) ×4
HANDLE YANKAUER SUCT BULB TIP (MISCELLANEOUS) ×3 IMPLANT
KIT CATH CVC 3 LUMEN 7FR 8IN (MISCELLANEOUS) ×3 IMPLANT
KIT RM TURNOVER STRD PROC AR (KITS) ×3 IMPLANT
LABEL OR SOLS (LABEL) ×3 IMPLANT
NS IRRIG 500ML POUR BTL (IV SOLUTION) ×3 IMPLANT
PACK BASIN MAJOR ARMC (MISCELLANEOUS) ×3 IMPLANT
PACK COLON CLEAN CLOSURE (MISCELLANEOUS) ×3 IMPLANT
PAD GROUND ADULT SPLIT (MISCELLANEOUS) ×3 IMPLANT
RETAINER VISCERA MED (MISCELLANEOUS) ×3 IMPLANT
SET YANKAUER POOLE SUCT (MISCELLANEOUS) ×3 IMPLANT
SPONGE LAP 18X18 5 PK (GAUZE/BANDAGES/DRESSINGS) ×3 IMPLANT
STAPLER SKIN PROX 35W (STAPLE) ×3 IMPLANT
STRIP CLOSURE SKIN 1/2X4 (GAUZE/BANDAGES/DRESSINGS) ×4 IMPLANT
SUT CHROMIC 3 0 SH 27 (SUTURE) ×3 IMPLANT
SUT ETHILON 4-0 (SUTURE) ×2
SUT ETHILON 4-0 FS2 18XMFL BLK (SUTURE) ×1
SUT VIC AB 2-0 BRD 54 (SUTURE) ×3 IMPLANT
SUT VICRYL 1 TIES 12X18 (SUTURE) ×3 IMPLANT
SUT VICRYL PLUS ABS 0 54 (SUTURE) ×6 IMPLANT
SUTURE ETHLN 4-0 FS2 18XMF BLK (SUTURE) ×1 IMPLANT
SUTURE VIC 1-0 (SUTURE) ×9 IMPLANT
SYR BULB IRRIG 60ML STRL (SYRINGE) ×3 IMPLANT

## 2015-04-03 SURGICAL SUPPLY — 38 items
BARRIER SKIN 2 1/4 (WOUND CARE) ×3 IMPLANT
BARRIER SKIN 2 1/4INCH (WOUND CARE) ×1
CANISTER SUCT 1200ML W/VALVE (MISCELLANEOUS) ×4 IMPLANT
CATH TRAY 16F METER LATEX (MISCELLANEOUS) ×4 IMPLANT
CHLORAPREP W/TINT 26ML (MISCELLANEOUS) ×4 IMPLANT
DRAPE LAPAROTOMY 100X77 ABD (DRAPES) ×4 IMPLANT
ELECT BLADE 6 FLAT ULTRCLN (ELECTRODE) ×4 IMPLANT
ELECT CAUTERY BLADE 6.4 (BLADE) ×4 IMPLANT
GLOVE BIO SURGEON STRL SZ7.5 (GLOVE) ×20 IMPLANT
GLOVE BIOGEL PI IND STRL 6.5 (GLOVE) ×2 IMPLANT
GLOVE BIOGEL PI IND STRL 7.5 (GLOVE) ×2 IMPLANT
GLOVE BIOGEL PI INDICATOR 6.5 (GLOVE) ×2
GLOVE BIOGEL PI INDICATOR 7.5 (GLOVE) ×2
GLOVE EXAM NITRILE PF MED BLUE (GLOVE) ×4 IMPLANT
GOWN STRL REUS W/ TWL LRG LVL3 (GOWN DISPOSABLE) ×2 IMPLANT
GOWN STRL REUS W/ TWL XL LVL3 (GOWN DISPOSABLE) ×2 IMPLANT
GOWN STRL REUS W/TWL LRG LVL3 (GOWN DISPOSABLE) ×2
GOWN STRL REUS W/TWL XL LVL3 (GOWN DISPOSABLE) ×2
HANDLE YANKAUER SUCT BULB TIP (MISCELLANEOUS) ×4 IMPLANT
KIT RM TURNOVER STRD PROC AR (KITS) ×4 IMPLANT
LABEL OR SOLS (LABEL) ×4 IMPLANT
LIGASURE 5MM LAPAROSCOPIC (INSTRUMENTS) ×4 IMPLANT
NS IRRIG 500ML POUR BTL (IV SOLUTION) ×4 IMPLANT
PACK BASIN MAJOR ARMC (MISCELLANEOUS) ×4 IMPLANT
PAD GROUND ADULT SPLIT (MISCELLANEOUS) ×4 IMPLANT
POUCH DRAIN  2 1/4 MED RED 181 (OSTOMY) ×4 IMPLANT
RELOAD PROXIMATE 75MM BLUE (ENDOMECHANICALS) ×4 IMPLANT
RETAINER VISCERA MED (MISCELLANEOUS) ×8 IMPLANT
SEPRAFILM MEMBRANE 5X6 (MISCELLANEOUS) ×8 IMPLANT
SET YANKAUER POOLE SUCT (MISCELLANEOUS) ×4 IMPLANT
STAPLER AUT SUT LDS 15W (STAPLE) ×4 IMPLANT
STAPLER PROXIMATE 75MM BLUE (STAPLE) ×4 IMPLANT
STAPLER SKIN PROX 35W (STAPLE) ×4 IMPLANT
SUT CHROMIC 3 0 SH 27 (SUTURE) ×16 IMPLANT
SUT PDS AB 1 TP1 96 (SUTURE) ×8 IMPLANT
SUT VIC AB 2-0 BRD 54 (SUTURE) ×4 IMPLANT
SUT VICRYL PLUS ABS 0 54 (SUTURE) ×12 IMPLANT
SUTURE VIC 1-0 (SUTURE) ×4 IMPLANT

## 2015-04-03 NOTE — Transfer of Care (Signed)
Immediate Anesthesia Transfer of Care Note  Patient: Yolanda Wells  Procedure(s) Performed: Procedure(s): COLECTOMY WITH COLOSTOMY CREATION/HARTMANN PROCEDURE (N/A) CENTRAL LINE INSERTION (Right)  Patient Location: PACU  Anesthesia Type:General  Level of Consciousness: awake and alert   Airway & Oxygen Therapy: Patient Spontanous Breathing and Patient connected to face mask oxygen  Post-op Assessment: Report given to RN, Post -op Vital signs reviewed and stable and Patient moving all extremities X 4  Post vital signs: Reviewed and stable  Last Vitals:  Filed Vitals:   04/03/15 1256  BP: 85/56  Pulse: 102  Temp: 36.6 C  Resp: 26    Complications: No apparent anesthesia complications

## 2015-04-03 NOTE — Progress Notes (Signed)
Seen in ICU More alert good UOP  Cbc pending No blood per rectum or ostomy. Stable course thus far.  Family updated post op  Likely move to floor in am if remains stable.

## 2015-04-03 NOTE — Consult Note (Addendum)
  Attempted GI Consult on patient.  Patient in OR at this time.  Vickey Huger NP [place]  [place], [place] [address] Phone: [phone] Fax : [phone]  Addendum:  I discussed her care with Dr Allen Norris.  Will sign off now from GI standpoint given patient had appropriate surgical intervention.  If we are needed, please re-consult.  1:52 PM

## 2015-04-03 NOTE — Care Management Note (Signed)
Case Management Note  Patient Details  Name: Yolanda Wells MRN: [id number] Date of Birth:                  To surgery today colectomy/colostomy.    Expected Discharge Date:                  Expected Discharge Plan:     In-House Referral:     Discharge planning Services     Post Acute Care Choice:    Choice offered to:     DME Arranged:    DME Agency:     HH Arranged:    [place] Agency:     Status of Service:     Medicare Important Message Given:  Yes Date Medicare IM Given:  04/02/15 Medicare IM give by:  Joni Reining Date Additional Medicare IM Given:    Additional Medicare Important Message give by:     If discussed at [place] of Stay Meetings, dates discussed:    Additional Comments:  Desi Carby A, RN 04/03/2015, 1:53 PM

## 2015-04-03 NOTE — Anesthesia Procedure Notes (Signed)
Procedure Name: Intubation Date/Time: 04/03/2015 10:05 AM Performed by: Delaney Meigs Pre-anesthesia Checklist: Patient identified, Emergency Drugs available, Suction available, Patient being monitored and Timeout performed Patient Re-evaluated:Patient Re-evaluated prior to inductionOxygen Delivery Method: Circle system utilized and Simple face mask Preoxygenation: Pre-oxygenation with 100% oxygen Intubation Type: IV induction Laryngoscope Size: Mac and 3 Grade View: Grade II Tube type: Oral Tube size: 7.0 mm Number of attempts: 1 Placement Confirmation: ETT inserted through vocal cords under direct vision,  positive ETCO2 and breath sounds checked- equal and bilateral Secured at: 21 cm Tube secured with: Tape Dental Injury: Teeth and Oropharynx as per pre-operative assessment

## 2015-04-03 NOTE — Progress Notes (Signed)
Patient ID: Yolanda Wells, female   DOB: 18-Mar-1932, 81 y.o.   MRN: [id number] Patient ID: Yolanda Wells, female   DOB: 16-Jul-1932, 81 y.o.   MRN: [id number]  HISTORY: Stable overnight but now having large BRBPR. No stools overnight reported. She reports mild abd pain.    PERTINENT REVIEW OF SYSTEMS: As above, bright red blood per rectum witnessed with RN.  Filed Vitals:   04/03/15 0200 04/03/15 0300 04/03/15 0400 04/03/15 0500  BP: 116/59 107/58 115/59   Pulse: 95 96 98 102  Temp:      TempSrc:      Resp: 18 17 19 18   Height:      Weight:    96.9 kg (213 lb 10 oz)  SpO2: 100% 100% 100% 98%   CBC Latest Ref Rng 04/03/2015 04/03/2015 04/02/2015  WBC 3.6 - 11.0 K/uL 13.0(H) - -  Hemoglobin 12.0 - 16.0 g/dL 9.1(L) 10.5(L) 10.4(L)  Hematocrit 35.0 - 47.0 % 27.4(L) - -  Platelets 150 - 440 K/uL 608(H) - -   BMP Latest Ref Rng 04/03/2015 04/01/2015 03/22/2015  Glucose 65 - 99 mg/dL 128(H) 130(H) 93  BUN 6 - 20 mg/dL 15 19 12   Creatinine 0.44 - 1.00 mg/dL 0.56 0.83 0.52  Sodium 135 - 145 mmol/L 140 137 138  Potassium 3.5 - 5.1 mmol/L 3.8 4.3 4.7  Chloride 101 - 111 mmol/L 114(H) 108 107  CO2 22 - 32 mmol/L 23 24 24   Calcium 8.9 - 10.3 mg/dL 7.3(L) 8.2(L) 9.0     EXAM: Head: Normocephalic and atraumatic.  Eyes:  Conjunctivae are normal. Pupils are equal, round, and reactive to light. No scleral icterus.  Neck:  Normal range of motion. Neck supple. No tracheal deviation present. No thyromegaly present.  Resp: Lungs are clear bilaterally.  No respiratory distress, normal effort. Heart:  Regular without murmurs Abd:  Abdomen is soft, non distended and non tender. No masses are palpable.  There is no rebound and no guarding.  Rectal  Bright red blood present. Neurological: Alert and oriented to person, place, and time. Coordination normal.  Skin: Skin is warm and dry. No rash noted. No diaphoretic. No erythema. No pallor.  Psychiatric: Normal mood and affect. Normal behavior. Judgment  and thought content normal.      ASSESSMENT: Lower GI bleed s/p embolization yesterday. Relative anemia and mild hypotension.   PLAN:  PLan left colectomy, end ostomy today Transfuse PRBC's, will ask for 2 units on hold.  Spoke with her granddaughter [name] by telephone.

## 2015-04-03 NOTE — Progress Notes (Signed)
 Vein & Vascular Surgery  Daily Progress Note   Subjective: 1 day post-procedure from micro-bead embolization with 300-500  polyvinyl alcohol beads to the splenic flexure branch of the inferior mesenteric artery for brisk lower GI bleeding. Patient continues to bleed, scheduled for left colectomy with end ostomy today. Patient with some minimal oozing from access site. No lower extremity pain. Some mild abdominal pain.   Objective: Filed Vitals:   04/03/15 0200 04/03/15 0300 04/03/15 0400 04/03/15 0500  BP: 116/59 107/58 115/59   Pulse: 95 96 98 102  Temp:      TempSrc:      Resp: 18 17 19 18   Height:      Weight:    96.9 kg (213 lb 10 oz)  SpO2: 100% 100% 100% 98%    Intake/Output Summary (Last 24 hours) at 04/03/15 1610 Last data filed at 04/03/15 0100  Gross per 24 hour  Intake   2370 ml  Output      0 ml  Net   2370 ml    Physical Exam: A&Ox3 CV: RRR Pulmonary: CTA Bilaterally Abdomen: Soft, Nontender, Nondistended Vascular: Right Groin: minimal oozing from access site. No swelling noted. Right Extremity: thigh soft, calf soft, DP pulses present. Left Extremity: thigh soft, calf soft, DP pulses present.   Laboratory: CBC    Component Value Date/Time   WBC 13.0* 04/03/2015 0636   WBC 3.7 07/08/2014 0936   HGB 8.8* 04/03/2015 0753   HGB 13.8 07/08/2014 0936   HCT 27.4* 04/03/2015 0636   HCT 42.0 07/08/2014 0936   PLT 608* 04/03/2015 0636   PLT 834* 07/08/2014 0936    BMET    Component Value Date/Time   NA 140 04/03/2015 0636   NA 144 07/08/2014 0936   K 3.8 04/03/2015 0636   K 3.7 07/08/2014 0936   CL 114* 04/03/2015 0636   CL 108* 07/08/2014 0936   CO2 23 04/03/2015 0636   CO2 27 07/08/2014 0936   GLUCOSE 128* 04/03/2015 0636   GLUCOSE 90 07/08/2014 0936   BUN 15 04/03/2015 0636   BUN 15 07/08/2014 0936   CREATININE 0.56 04/03/2015 0636   CREATININE 0.64 07/08/2014 0936   CALCIUM 7.3* 04/03/2015 0636   CALCIUM 8.4* 07/08/2014 0936   GFRNONAA >60 04/03/2015 0636   GFRNONAA >60 07/08/2014 0936   GFRAA >60 04/03/2015 0636   GFRAA >60 07/08/2014 0936    Assessment/Planning: 1 day post-procedure from micro-bead embolization with 300-500  polyvinyl alcohol beads to the splenic flexure branch of the inferior mesenteric artery for brisk lower GI bleeding. Patient continues to bleed, scheduled for left colectomy with end ostomy today. 1) Continue dressing access site with dry gauze. 2) With continued bleeding patient is scheduled for colectomy today. 3) Care as per primary team.  4) Vascular surgery will sign off at this point. Please reconsult in the future if needed.   Marcelle Overlie PA-C 04/03/2015 8:23 AM

## 2015-04-03 NOTE — Progress Notes (Signed)
04/01/2015 - 04/03/2015  12:34 PM  PATIENT:  Yolanda Wells  81 y.o. female  PRE-OPERATIVE DIAGNOSIS:  Massive lower GI bleed  POST-OPERATIVE DIAGNOSIS:  Same  PROCEDURE:  Procedure(s): COLECTOMY WITH COLOSTOMY CREATION/HARTMANN PROCEDURE (N/A) CENTRAL LINE INSERTION (Right)  SURGEON:  Surgeon(s) and Role:    * Sherri Rad, MD - Primary    * Nestor Lewandowsky, MD - Assisting   ANESTHESIA:   general  SPECIMEN: Left colon  DICTATION:   With the patient in the supine position general oral endotracheal anesthesia was induced. Timeout was observed. The patient's right groin was sterilely prepped and draped for prep solution and a right femoral line was placed on first pass. Dressing was applied.  The patient's abdomen was then widely prepped utilizing chlor prep timeout was observed again midline skin incision was fashioned from just below the xiphoid to just below the umbilicus with scalpel intracardiac to muscular fascial layers. Self-retaining abdominal retractor was placed. Gen. expiration of the abdomen demonstrated no obvious mass. There was evidence of blood within the left colon. The left colon was mobilized along the white line of [place]. Splenic flexure was delivered along the avascular plane. The omentum was separated from the transverse colon utilizing large cautery and the avascular plane. The course of the middle colic artery was identified and preserved. GIA-75 stapler was applied to the left of the middle colic artery. Splenic flexure clearly identified there was evidence of a redundant fold within it which was taken down. This entire area was resected. Second application of the GIA 75 stapler was performed at the mid descending colon. The intervening mesentery was divided between clamps LigaSure and the powered LDS apparatus. The specimen was handed off the field. The distal staple line was imbricated utilizing 3-0 silk seromuscular fashion.  The left abdomen ostomy site was chosen  through the rectus muscle and a wheel of skin was excised followed by cruciate incisions made in fascial layers along with a muscle splitting technique. The colon was easily delivered intact to the undersurface of the abdominal wall peritoneum with 3-0 silk suture. The midline fascia was reapproximated from the extremes utilizing looped #1 PDS with application of 2 sheets of Seprafilm.  The colostomy was then matured utilizing interrupted 3-0 chromic suture through skin and seromuscular layer of the bowel.  Ostomy appliance was placed followed by the application of skin staples and an occlusive sterile dressing.

## 2015-04-03 NOTE — Progress Notes (Signed)
PHARMACY - CRITICAL CARE PROGRESS NOTE  Pharmacy Consult for Electrolyte Management  Indication: ICU Status   No Known Allergies  Patient Measurements: Height: 5\' 7"  (170.2 cm) Weight: 213 lb 10 oz (96.9 kg) IBW/kg (Calculated) : 61.6  Vital Signs: Temp: 97.6 F (36.4 C) (05/24 1415) Temp Source: Axillary (05/24 0915) BP: 102/62 mmHg (05/24 1415) Pulse Rate: 92 (05/24 1355) Intake/Output from previous day: 05/23 0701 - 05/24 0700 In: 2370 [I.V.:1770; Blood:600] Out: -  Intake/Output from this shift: Total I/O In: 3207 [I.V.:2500; Blood:707] Out: 250 [Urine:200; Blood:50] Vent settings for last 24 hours:    Labs:   Recent Labs  04/01/15 2230  04/02/15 1410  04/03/15 0131 04/03/15 0636 04/03/15 0753  WBC 4.8  --   --   --   --  13.0*  --   HGB 12.9  < > 8.8*  < > 10.5* 9.1* 8.8*  HCT 39.5  --  27.3*  --   --  27.4*  --   PLT 571*  --   --   --   --  608*  --   APTT  --   --   --   --   --   --  36  INR 1.25  --   --   --   --   --  1.34  CREATININE 0.83  --   --   --   --  0.56  --   MG  --   --   --   --   --  1.6*  --   PHOS  --   --   --   --   --  3.4  --   ALBUMIN 3.1*  --   --   --   --   --   --   PROT 6.4*  --   --   --   --   --   --   AST 15  --   --   --   --   --   --   ALT 7*  --   --   --   --   --   --   ALKPHOS 73  --   --   --   --   --   --   BILITOT 0.7  --   --   --   --   --   --   < > = values in this interval not displayed. Estimated Creatinine Clearance: 65.9 mL/min (by C-G formula based on Cr of 0.56).   Recent Labs  04/02/15 1539 04/03/15 0736  GLUCAP 102* 130*    Microbiology: No results found for this or any previous visit (from the past 720 hour(s)).  Medications:  Scheduled:  . magnesium sulfate 1 - 4 g bolus IVPB  2 g Intravenous Once   Infusions:  . sodium chloride 125 mL/hr (04/03/15 0630)    Assessment: 81 yo female admitted to ICU with  GI bleed and requiring pressors.    Plan:  Magnesium slightly low  today at 1.6.  Will order Magnesium 2 gm IV once.  Will recheck labs in AM.  Pharmacy will continue to monitor and adjust per consult.    Murrell Converse, PharmD Clinical Pharmacist 04/03/2015

## 2015-04-03 NOTE — Anesthesia Preprocedure Evaluation (Addendum)
Anesthesia Evaluation  Patient identified by MRN, date of birth, ID band Patient awake    Reviewed: Allergy & Precautions, NPO status , Patient's Chart, lab work & pertinent test results  Airway Mallampati: III  TM Distance: >3 FB Neck ROM: Full    Dental  (+) Edentulous Upper, Poor Dentition   Pulmonary          Cardiovascular hypertension, Pt. on medications     Neuro/Psych    GI/Hepatic   Endo/Other    Renal/GU      Musculoskeletal   Abdominal   Peds  Hematology  (+) Blood dyscrasia (polycythemia rubra vera), anemia ,   Anesthesia Other Findings   Reproductive/Obstetrics                           Anesthesia Physical Anesthesia Plan  ASA: III and emergent  Anesthesia Plan: General   Post-op Pain Management:    Induction: Intravenous, Rapid sequence and Cricoid pressure planned  Airway Management Planned: Oral ETT  Additional Equipment: Arterial line  Intra-op Plan:   Post-operative Plan:   Informed Consent: I have reviewed the patients History and Physical, chart, labs and discussed the procedure including the risks, benefits and alternatives for the proposed anesthesia with the patient or authorized representative who has indicated his/her understanding and acceptance.     Plan Discussed with:   Anesthesia Plan Comments:         Anesthesia Quick Evaluation

## 2015-04-03 NOTE — Progress Notes (Signed)
Pt has had continuous rectal bleeding bright red blood present with large blood clots; per Dr Lucky Cowboy obtained a stat hemoglobin and notified Dr Marina Gravel per Dr Marina Gravel given orders to administer 1 unit of pRBC's and prepare pt for operating room for a colectomy and colostomy. Dr. Marina Gravel spoke with pts granddaughter [name] on the telephone about plan of care

## 2015-04-03 NOTE — Anesthesia Postprocedure Evaluation (Signed)
  Anesthesia Post-op Note  Patient: Yolanda Wells  Procedure(s) Performed: Procedure(s): COLECTOMY WITH COLOSTOMY CREATION/HARTMANN PROCEDURE (N/A) CENTRAL LINE INSERTION (Right)  Anesthesia type:General  Patient location: PACU  Post pain: Pain level controlled  Post assessment: Post-op Vital signs reviewed, Patient's Cardiovascular Status Stable, Respiratory Function Stable, Patent Airway and No signs of Nausea or vomiting  Post vital signs: Reviewed and stable  Last Vitals:  Filed Vitals:   04/03/15 1415  BP: 102/62  Pulse:   Temp: 36.4 C  Resp: 13    Level of consciousness: awake, alert  and patient cooperative  Complications: No apparent anesthesia complications

## 2015-04-03 NOTE — Progress Notes (Signed)
Pt transported to OR report called to Butch Penny, RN informed nurse pt alert and oriented on 2L O2 via nasal canula with O2 sats 100%; vss; pt still actively bleeding bright red blood with large blood clots; 1 unit of pRBC's currently infusing, consent signed and on the chart

## 2015-04-03 NOTE — Progress Notes (Signed)
[facility] at [facility]                                                                                                                                                                                            Patient Demographics   Yolanda Wells, is a 81 y.o. female, DOB - February 04, 1932, PTW:[id number]  Admit date - 04/01/2015   Admitting Physician Harrie Foreman, MD  Outpatient Primary MD for the patient is No primary care provider on file.  LOS - 1  Chief Complaint  Patient presents with  . Rectal Bleeding      Patient continued to have bleeding overnight and had to go to the OR earlier today , underwent hemicolectomy . Patient currently sleepy  Review of Systems:   CONSTITUTIONAL: No documented fever. No fatigue, weakness. No weight gain, no weight loss.  EYES: No blurry or double vision.  ENT: No tinnitus. No postnasal drip. No redness of the oropharynx.  RESPIRATORY: No cough, no wheeze, no hemoptysis. No dyspnea.  CARDIOVASCULAR: No chest pain. No orthopnea. No palpitations. No syncope.  GASTROINTESTINAL: No nausea, no vomiting or positive diarrhea. No abdominal pain. Bright red blood per rectum GENITOURINARY: No dysuria or hematuria.  ENDOCRINE: No polyuria or nocturia. No heat or cold intolerance.  HEMATOLOGY: No anemia. No bruising. No bleeding.  INTEGUMENTARY: No rashes. No lesions.  MUSCULOSKELETAL: No arthritis. No swelling. No gout.  NEUROLOGIC: No numbness, tingling, or ataxia. No seizure-type activity.  PSYCHIATRIC: No anxiety. No insomnia. No ADD.    Vitals:   Filed Vitals:   04/03/15 1345 04/03/15 1355 04/03/15 1405 04/03/15 1415  BP: 95/58 109/65 107/63 102/62  Pulse: 93 92    Temp:    97.6 F (36.4 C)  TempSrc:      Resp: 16 13 13 13   Height:      Weight:      SpO2: 100% 100%      Wt Readings from Last 3 Encounters:  04/03/15 96.9 kg (213 lb 10 oz)  03/25/15 108.863 kg (240 lb)  03/22/15 108.863 kg (240 lb)      Intake/Output Summary (Last 24 hours) at 04/03/15 1453 Last data filed at 04/03/15 1418  Gross per 24 hour  Intake   5577 ml  Output    250 ml  Net   5327 ml    Physical Exam:   GENERAL: Pleasant-appearing in no apparent distress.  HEAD, EYES, EARS, NOSE AND THROAT: Atraumatic, normocephalic. Extraocular muscles are intact. Pupils equal and reactive to light. Sclerae anicteric. No conjunctival injection. No oro-pharyngeal erythema.  NECK: Supple. There is no jugular  venous distention. No bruits, no lymphadenopathy, no thyromegaly.  HEART: Regular rate and rhythm, tachycardic. No murmurs, no rubs, no clicks.  LUNGS: Clear to auscultation bilaterally. No rales or rhonchi. No wheezes.  ABDOMEN:Status post colostomy placement  EXTREMITIES: No evidence of any cyanosis, clubbing, or peripheral edema.  +2 pedal and radial pulses bilaterally.  NEUROLOGIC: The patient is alert, awake, and oriented x3 with no focal motor or sensory deficits appreciated bilaterally.  SKIN: Moist and warm with no rashes appreciated.  Psych: Not anxious, depressed LN: No inguinal LN enlargement    Antibiotics   Anti-infectives    Start     Dose/Rate Route Frequency Ordered Stop   04/03/15 0815  ertapenem (INVANZ) 1 g in sodium chloride 0.9 % 50 mL IVPB     1 g 100 mL/hr over 30 Minutes Intravenous  Once 04/03/15 0808 04/03/15 1019   04/02/15 0945  ceFAZolin (ANCEF) IVPB 1 g/50 mL premix    Comments:  Send with pt to OR   1 g 100 mL/hr over 30 Minutes Intravenous On call 04/02/15 0936 04/02/15 1327      Medications   Scheduled Meds: . magnesium sulfate 1 - 4 g bolus IVPB  2 g Intravenous Once   Continuous Infusions: . sodium chloride 125 mL/hr (04/03/15 0630)   PRN Meds:.fentaNYL (SUBLIMAZE) injection, morphine injection, ondansetron **OR** ondansetron (ZOFRAN) IV, ondansetron (ZOFRAN) IV   Data Review:   Micro Results No results found for this or any previous visit (from the past 240  hour(s)).  Radiology Reports Nm Gi Blood Loss  04/02/2015   CLINICAL DATA:  Acute GI bleed.  EXAM: NUCLEAR MEDICINE GASTROINTESTINAL BLEEDING SCAN  TECHNIQUE: Sequential abdominal images were obtained following intravenous administration of Tc-62m labeled red blood cells.  RADIOPHARMACEUTICALS:  20 mCi Tc-60m in-vitro labeled red cells.  COMPARISON:  CT abdomen pelvis - 07/08/2014  FINDINGS: There is abnormal radiotracer accumulation overlying the expected location of the descending colon within the left mid hemi abdomen. This activity persists and peristalsis with subsequent acquired images.  Physiologic activity is seen within the heart and major abdominal vasculature.  IMPRESSION: Findings compatible with acute lower GI bleed with suspected area of bleeding within the proximal descending colon.  Critical Value/emergent results were called by telephone at the time of interpretation on 04/02/2015 at 3:05 am to Dr. Charlesetta Ivory , who verbally acknowledged these results.   Electronically Signed   By: Sandi Mariscal M.D.   On: 04/02/2015 03:07   US Venous Img Lower Unilateral Left  03/25/2015   CLINICAL DATA:  Left lower extremity pain for 1 day.  EXAM: Left LOWER EXTREMITY VENOUS DOPPLER ULTRASOUND  TECHNIQUE: Gray-scale sonography with graded compression, as well as color Doppler and duplex ultrasound were performed to evaluate the lower extremity deep venous systems from the level of the common femoral vein and including the common femoral, femoral, profunda femoral, popliteal and calf veins including the posterior tibial, peroneal and gastrocnemius veins when visible. The superficial great saphenous vein was also interrogated. Spectral Doppler was utilized to evaluate flow at rest and with distal augmentation maneuvers in the common femoral, femoral and popliteal veins.  COMPARISON:  None.  FINDINGS: Contralateral Common Femoral Vein: Respiratory phasicity is normal and symmetric with the symptomatic side.  No evidence of thrombus. Normal compressibility.  Common Femoral Vein: No evidence of thrombus. Normal compressibility, respiratory phasicity and response to augmentation.  Saphenofemoral Junction: No evidence of thrombus. Normal compressibility and flow on color Doppler imaging.  Profunda Femoral  Vein: No evidence of thrombus. Normal compressibility and flow on color Doppler imaging.  Femoral Vein: No evidence of thrombus. Normal compressibility, respiratory phasicity and response to augmentation.  Popliteal Vein: No evidence of thrombus. Normal compressibility, respiratory phasicity and response to augmentation.  Calf Veins: Posterior tibial and peroneal veins are not visualized.  Superficial Great Saphenous Vein: No evidence of thrombus. Normal compressibility and flow on color Doppler imaging.  Venous Reflux:  None.  Other Findings: Complex but predominantly hypoechoic abnormality is noted medially in the left calf which corresponds to palpable abnormality. It measures 5.1 x 2.2 x 3.6 cm and is most consistent with hematoma.  IMPRESSION: No definite evidence of deep venous thrombosis seen in left lower extremity, although calf veins are not visualized on this study. 5.1 cm complex hypoechoic abnormality is seen in the medial soft tissues of the left calf most consistent with hematoma ; but if there is no clinical evidence or history of traumatic injury, neoplasm cannot be excluded. MRI would be recommended in that setting.   Electronically Signed   By: Marijo Conception, M.D.   On: 03/25/2015 12:38   Dg Abd Acute W/chest  03/22/2015   CLINICAL DATA:  Left lower quadrant pain  EXAM: DG ABDOMEN ACUTE W/ 1V CHEST  COMPARISON:  None.  FINDINGS: The cardiac shadow is enlarged. The lungs are clear bilaterally. Degenerative changes of the shoulder joints are seen bilaterally. No free air is noted. Scattered large and small bowel gas is seen. Degenerative changes of lumbar spine are noted. Bilateral hip replacements  are seen.  IMPRESSION: No acute abnormality noted.   Electronically Signed   By: Inez Catalina M.D.   On: 03/22/2015 16:53     CBC  Recent Labs Lab 04/01/15 2230  04/02/15 1410 04/02/15 1923 04/03/15 0131 04/03/15 0636 04/03/15 0753  WBC 4.8  --   --   --   --  13.0*  --   HGB 12.9  < > 8.8* 10.4* 10.5* 9.1* 8.8*  HCT 39.5  --  27.3*  --   --  27.4*  --   PLT 571*  --   --   --   --  608*  --   MCV 103.3*  --   --   --   --  101.4*  --   MCH 33.7  --   --   --   --  33.6  --   MCHC 32.6  --   --   --   --  33.1  --   RDW 16.4*  --   --   --   --  21.6*  --   LYMPHSABS 0.6*  --   --   --   --   --   --   MONOABS 0.3  --   --   --   --   --   --   EOSABS 0.1  --   --   --   --   --   --   BASOSABS 0.0  --   --   --   --   --   --   < > = values in this interval not displayed.  Chemistries   Recent Labs Lab 04/01/15 2230 04/03/15 0636  NA 137 140  K 4.3 3.8  CL 108 114*  CO2 24 23  GLUCOSE 130* 128*  BUN 19 15  CREATININE 0.83 0.56  CALCIUM 8.2* 7.3*  MG  --  1.6*  AST 15  --   ALT 7*  --   ALKPHOS 73  --   BILITOT 0.7  --    ------------------------------------------------------------------------------------------------------------------ estimated creatinine clearance is 65.9 mL/min (by C-G formula based on Cr of 0.56). ------------------------------------------------------------------------------------------------------------------ No results for input(s): HGBA1C in the last 72 hours. ------------------------------------------------------------------------------------------------------------------ No results for input(s): CHOL, HDL, LDLCALC, TRIG, CHOLHDL, LDLDIRECT in the last 72 hours. ------------------------------------------------------------------------------------------------------------------ No results for input(s): TSH, T4TOTAL, T3FREE, THYROIDAB in the last 72 hours.  Invalid input(s):  FREET3 ------------------------------------------------------------------------------------------------------------------ No results for input(s): VITAMINB12, FOLATE, FERRITIN, TIBC, IRON, RETICCTPCT in the last 72 hours.  Coagulation profile  Recent Labs Lab 04/01/15 2230 04/03/15 0753  INR 1.25 1.34    No results for input(s): DDIMER in the last 72 hours.  Cardiac Enzymes No results for input(s): CKMB, TROPONINI, MYOGLOBIN in the last 168 hours.  Invalid input(s): CK ------------------------------------------------------------------------------------------------------------------ Invalid input(s): [place]   Assessment/Plan This is an 81 year old female with a lower GI bleed. 1. Lower GI bleed:  due to diverticular bleed, post hemicolectomy , monitor in ICU follow-up H&H . 2. Polycythemia: The patient has been on hydroxyurea but is starting a radioactive treatment called P 32. MCV is elevated as expected with hydroxyurea therapy 3. Hypertension:On hold due to low blood pressure  4. DVT prophylaxis: SCDs      Code Status Orders        Start     Ordered   04/02/15 0607  Full code   Continuous     04/02/15 0606    Advance Directive Documentation        Most Recent Value   Type of Advance Directive  Healthcare Power of Attorney   Pre-existing out of facility DNR order (yellow form or pink MOST form)     "MOST" Form in Place?        Family Communication: Granddaughter at the bedside  Disposition Plan: Home   Procedures bleeding scan   Consults  Vascular, Gen. surgery   DVT Prophylaxis  SCDs   Lab Results  Component Value Date   PLT 608* 04/03/2015     Time Spent in minutes  35  minutes   Dustin Flock M.D on 04/03/2015 at 2:53 PM  Between 7am to 6pm - Pager - [phone]  After 6pm go to [email or web address] - password [email or web address] [facility]   Office  [phone]

## 2015-04-04 ENCOUNTER — Encounter: Payer: Self-pay | Admitting: Surgery

## 2015-04-04 LAB — CBC
HCT: 28.5 % — ABNORMAL LOW (ref 35.0–47.0)
Hemoglobin: 9.3 g/dL — ABNORMAL LOW (ref 12.0–16.0)
MCH: 29.7 pg (ref 26.0–34.0)
MCHC: 32.7 g/dL (ref 32.0–36.0)
MCV: 90.9 fL (ref 80.0–100.0)
Platelets: 546 10*3/uL — ABNORMAL HIGH (ref 150–440)
RBC: 3.13 MIL/uL — AB (ref 3.80–5.20)
RDW: 28.3 % — AB (ref 11.5–14.5)
WBC: 14 10*3/uL — ABNORMAL HIGH (ref 3.6–11.0)

## 2015-04-04 LAB — BASIC METABOLIC PANEL
Anion gap: 1 — ABNORMAL LOW (ref 5–15)
BUN: 14 mg/dL (ref 6–20)
CALCIUM: 7.3 mg/dL — AB (ref 8.9–10.3)
CO2: 23 mmol/L (ref 22–32)
Chloride: 115 mmol/L — ABNORMAL HIGH (ref 101–111)
Creatinine, Ser: 0.49 mg/dL (ref 0.44–1.00)
GFR calc Af Amer: >60 mL/min (ref >60)
GFR calc non Af Amer: >60 mL/min (ref >60)
Glucose, Bld: 171 mg/dL — ABNORMAL HIGH (ref 65–99)
Potassium: 3.9 mmol/L (ref 3.5–5.1)
Sodium: 139 mmol/L (ref 135–145)

## 2015-04-04 LAB — PHOSPHORUS: Phosphorus: 2.1 mg/dL — ABNORMAL LOW (ref 2.5–4.6)

## 2015-04-04 LAB — MAGNESIUM: MAGNESIUM: 2 mg/dL (ref 1.7–2.4)

## 2015-04-04 LAB — SURGICAL PATHOLOGY

## 2015-04-04 MED ORDER — MORPHINE SULFATE 2 MG/ML IJ SOLN
1.0000 mg | INTRAMUSCULAR | Status: DC | PRN
  Administered 2015-04-06 – 2015-04-07 (×3): 1 mg via INTRAVENOUS
  Filled 2015-04-04 (×3): qty 1

## 2015-04-04 MED ORDER — SODIUM CHLORIDE 0.9 % IV SOLN
INTRAVENOUS | Status: DC
  Administered 2015-04-04 – 2015-04-05 (×2): via INTRAVENOUS

## 2015-04-04 MED ORDER — POTASSIUM & SODIUM PHOSPHATES 280-160-250 MG PO PACK
1.0000 | PACK | Freq: Three times a day (TID) | ORAL | Status: AC
  Administered 2015-04-04 (×2): 1 via ORAL
  Filled 2015-04-04 (×2): qty 1

## 2015-04-04 MED ORDER — PANTOPRAZOLE SODIUM 40 MG PO TBEC
40.0000 mg | DELAYED_RELEASE_TABLET | Freq: Once | ORAL | Status: AC
  Administered 2015-04-04: 40 mg via ORAL
  Filled 2015-04-04: qty 1

## 2015-04-04 NOTE — Progress Notes (Signed)
Surgery  POD 1  S/P left colectomy, colostomy  Filed Vitals:   04/04/15 0400 04/04/15 0558 04/04/15 0700 04/04/15 0800  BP:   100/56 99/58  Pulse:   95 94  Temp: 98.6 F (37 C)   98.5 F (36.9 C)  TempSrc: Oral   Oral  Resp:   17 16  Height:      Weight:  103.5 kg (228 lb 2.8 oz)    SpO2:   97% 98%    ZD:GUYQI, abs soft, old blood in ostomy bag   Labs  CBC Latest Ref Rng 04/04/2015 04/03/2015 04/03/2015  WBC 3.6 - 11.0 K/uL 14.0(H) 14.9(H) -  Hemoglobin 12.0 - 16.0 g/dL 9.3(L) 10.8(L) 8.8(L)  Hematocrit 35.0 - 47.0 % 28.5(L) 32.5(L) -  Platelets 150 - 440 K/uL 546(H) 557(H) -    BMP Latest Ref Rng 04/04/2015 04/03/2015 04/01/2015  Glucose 65 - 99 mg/dL 171(H) 128(H) 130(H)  BUN 6 - 20 mg/dL 14 15 19   Creatinine 0.44 - 1.00 mg/dL 0.49 0.56 0.83  Sodium 135 - 145 mmol/L 139 140 137  Potassium 3.5 - 5.1 mmol/L 3.9 3.8 4.3  Chloride 101 - 111 mmol/L 115(H) 114(H) 108  CO2 22 - 32 mmol/L 23 23 24   Calcium 8.9 - 10.3 mg/dL 7.3(L) 7.3(L) 8.2(L)      IMP Stable course  Plan stable to move to floor, clears ok, ostomy teaching, dispo planning. OOB to chair.

## 2015-04-04 NOTE — Evaluation (Signed)
Physical Therapy Evaluation Patient Details Name: Yolanda Wells MRN: [id number] DOB: 10-11-1932 Today's Date: 04/04/2015   History of Present Illness  Pt is an 81 y.o. female (with h/o R toe amp and B THR) admitted with acute lower GI bleed with positive bleeding scan.  Pt transferred to ICU 04/02/15 d/t significant hypotension and brisk bleeding and s/p 2 units PRBC's.  Pt s/p embolization 04/02/15; s/p colectomy with colostomy 04/03/15; s/p femoral central line insertion 04/03/15.   Clinical Impression  Currently pt demonstrates impairments with strength, activity tolerance, balance, and limitations with functional mobility.  Prior to admission, pt was independent ambulating with rollator short distances and used motorized wheelchair for longer distances.  Pt lives at [facility] and gets assist for meals and cleaning.  Currently pt is max assist x2 for bed mobility and unable to stand with max assist x2.  Pt would benefit from skilled PT to address above noted impairments and functional limitations.  Recommend pt discharge to STR when medically appropriate (pt and pt's brother agreeable to this).     Follow Up Recommendations SNF    Equipment Recommendations       Recommendations for Other Services       Precautions / Restrictions Precautions Precautions: Fall Precaution Comments: R femoral central line (not for dialysis) Restrictions Weight Bearing Restrictions: No      Mobility  Bed Mobility Overal bed mobility: Needs Assistance;+2 for physical assistance Bed Mobility: Supine to Sit;Sit to Supine     Supine to sit: Max assist;+2 for physical assistance Sit to supine: Max assist;+2 for physical assistance   General bed mobility comments: via logrolling; assist for trunk and B LE's  Transfers Overall transfer level: Needs assistance Equipment used: Rolling walker (2 wheeled) Transfers: Sit to/from Stand Sit to Stand:  (Unable to stand with max assist  x2)            Ambulation/Gait             General Gait Details: Not appropriate at this time  Stairs            Wheelchair Mobility    Modified Rankin (Stroke Patients Only)       Balance                                             Pertinent Vitals/Pain Pain Assessment: Faces Pain Score: 6  Pain Location: abdominal pain with sitting Pain Intervention(s): Limited activity within patient's tolerance;Monitored during session;Repositioned  Vitals stable and WFL throughout treatment session.     Home Living Family/patient expects to be discharged to:: Skilled nursing facility Living Arrangements: Alone   Type of Home: Assisted living Home Access: Level entry     Home Layout: One level Home Equipment: Qulin - 4 wheels;Bedside commode;Shower seat      Prior Function Level of Independence: Independent with assistive device(s)         Comments: Uses rollator for short distances (up to 50 feet) and motorized wheelchair for longer distances.     Hand Dominance        Extremity/Trunk Assessment   Upper Extremity Assessment: Generalized weakness           Lower Extremity Assessment: Generalized weakness  B DF to neutral       Communication   Communication: No difficulties (although some confusion noted)  Cognition Arousal/Alertness: Awake/alert  Behavior During Therapy:  (pt with eyes closed and tangential; requires redirection) Overall Cognitive Status: Impaired/Different from baseline (some confusion noted) Area of Impairment: Attention;Following commands;Safety/judgement   Current Attention Level: Alternating Memory: Decreased recall of precautions Following Commands: Follows one step commands inconsistently Safety/Judgement: Decreased awareness of safety          General Comments  Nursing cleared pt for participation in physical therapy and to get OOB and mobilize with PT.    Exercises         Assessment/Plan    PT Assessment Patient needs continued PT services  PT Diagnosis Difficulty walking;Generalized weakness;Acute pain   PT Problem List Decreased strength;Decreased range of motion;Decreased activity tolerance;Decreased balance;Decreased mobility;Decreased cognition;Decreased knowledge of precautions;Decreased safety awareness;Pain  PT Treatment Interventions DME instruction;Gait training;Therapeutic exercise;Therapeutic activities;Functional mobility training;Balance training;Neuromuscular re-education;Patient/family education;Wheelchair mobility training   PT Goals (Current goals can be found in the Care Plan section) Acute Rehab PT Goals Patient Stated Goal: To get stronger PT Goal Formulation: With patient Time For Goal Achievement: 04/18/15 Potential to Achieve Goals: Fair    Frequency Min 2X/week   Barriers to discharge Decreased caregiver support      Co-evaluation               End of Session Equipment Utilized During Treatment: Gait belt Activity Tolerance: Patient limited by pain;Patient limited by fatigue Patient left: in bed;with call bell/phone within reach;with bed alarm set;with family/visitor present;with SCD's reapplied Nurse Communication: Mobility status;Need for lift equipment;Precautions         Time: 1540-1610 PT Time Calculation (min) (ACUTE ONLY): 30 min   Charges:   PT Evaluation $Initial PT Evaluation Tier I: 1 Procedure     PT G CodesLeitha Bleak 05/03/15, 4:38 PM Leitha Bleak, [place]

## 2015-04-04 NOTE — Progress Notes (Signed)
Pt c/o heartburn. Dr. Bridgett Larsson notified and ordered x1 dose of protonix 40 mg.

## 2015-04-04 NOTE — Progress Notes (Signed)
Pt rested well last night with c/o pain x 2 medICATIONS EFFECTIVE.[name] Physcian notified of decreased UOP. No new orders received.no bleeding noted from rectum /colostomy this shift

## 2015-04-04 NOTE — Progress Notes (Signed)
[place] at [facility]                                                                                                                                                                                            Patient Demographics   Yolanda Wells, is a 81 y.o. female, DOB - Jun 14, 1932, POE:[id number]  Admit date - 04/01/2015   Admitting Physician Harrie Foreman, MD  Outpatient Primary MD for the patient is No primary care provider on file.  LOS - 2  Chief Complaint  Patient presents with  . Rectal Bleeding      No new events overnight, no significant pain, denies any fever or chills, no cp or sob   Review of Systems:   CONSTITUTIONAL: No documented fever. No fatigue, weakness. No weight gain, no weight loss.  EYES: No blurry or double vision.  ENT: No tinnitus. No postnasal drip. No redness of the oropharynx.  RESPIRATORY: No cough, no wheeze, no hemoptysis. No dyspnea.  CARDIOVASCULAR: No chest pain. No orthopnea. No palpitations. No syncope.  GASTROINTESTINAL: No nausea, no vomiting or positive diarrhea. No abdominal pain. Bright red blood per rectum GENITOURINARY: No dysuria or hematuria.  ENDOCRINE: No polyuria or nocturia. No heat or cold intolerance.  HEMATOLOGY: Positive anemia. No bruising. No bleeding.  INTEGUMENTARY: No rashes. No lesions.  MUSCULOSKELETAL: No arthritis. No swelling. No gout.  NEUROLOGIC: No numbness, tingling, or ataxia. No seizure-type activity.  PSYCHIATRIC: No anxiety. No insomnia. No ADD.    Vitals:   Filed Vitals:   04/04/15 0700 04/04/15 0800 04/04/15 0900 04/04/15 1000  BP: 100/56 99/58 93/62  99/63  Pulse: 95 94 98 96  Temp:  98.5 F (36.9 C)    TempSrc:  Oral    Resp: 17 16 32 16  Height:      Weight:      SpO2: 97% 98% 100% 100%    Wt Readings from Last 3 Encounters:  04/04/15 103.5 kg (228 lb 2.8 oz)  03/25/15 108.863 kg (240 lb)  03/22/15 108.863 kg (240 lb)     Intake/Output  Summary (Last 24 hours) at 04/04/15 1126 Last data filed at 04/04/15 0700  Gross per 24 hour  Intake   5117 ml  Output    675 ml  Net   4442 ml    Physical Exam:   GENERAL: Pleasant-appearing in no apparent distress.  HEAD, EYES, EARS, NOSE AND THROAT: Atraumatic, normocephalic. Extraocular muscles are intact. Pupils equal and reactive to light. Sclerae anicteric. No conjunctival injection. No oro-pharyngeal erythema.  NECK: Supple. There is no jugular venous distention. No bruits, no  lymphadenopathy, no thyromegaly.  HEART: Regular rate and rhythm, tachycardic. No murmurs, no rubs, no clicks.  LUNGS: Clear to auscultation bilaterally. No rales or rhonchi. No wheezes.  ABDOMEN: Ostomy bag in place, has a bowel sounds, nontender EXTREMITIES: No evidence of any cyanosis, clubbing, or peripheral edema.  +2 pedal and radial pulses bilaterally.  NEUROLOGIC: The patient is alert, awake, and oriented x3 with no focal motor or sensory deficits appreciated bilaterally.  SKIN: Moist and warm with no rashes appreciated.  Psych: Not anxious, depressed LN: No inguinal LN enlargement    Antibiotics   Anti-infectives    Start     Dose/Rate Route Frequency Ordered Stop   04/03/15 0815  ertapenem (INVANZ) 1 g in sodium chloride 0.9 % 50 mL IVPB     1 g 100 mL/hr over 30 Minutes Intravenous  Once 04/03/15 0808 04/03/15 1019   04/02/15 0945  ceFAZolin (ANCEF) IVPB 1 g/50 mL premix    Comments:  Send with pt to OR   1 g 100 mL/hr over 30 Minutes Intravenous On call 04/02/15 0936 04/02/15 1327      Medications   Scheduled Meds: . potassium & sodium phosphates  1 packet Oral TID AC   Continuous Infusions: . sodium chloride 125 mL/hr at 04/04/15 0810   PRN Meds:.fentaNYL (SUBLIMAZE) injection, morphine injection, [DISCONTINUED] ondansetron **OR** ondansetron (ZOFRAN) IV   Data Review:   Micro Results No results found for this or any previous visit (from the past 240 hour(s)).  Radiology  Reports Nm Gi Blood Loss  04/02/2015   CLINICAL DATA:  Acute GI bleed.  EXAM: NUCLEAR MEDICINE GASTROINTESTINAL BLEEDING SCAN  TECHNIQUE: Sequential abdominal images were obtained following intravenous administration of Tc-23m labeled red blood cells.  RADIOPHARMACEUTICALS:  20 mCi Tc-65m in-vitro labeled red cells.  COMPARISON:  CT abdomen pelvis - 07/08/2014  FINDINGS: There is abnormal radiotracer accumulation overlying the expected location of the descending colon within the left mid hemi abdomen. This activity persists and peristalsis with subsequent acquired images.  Physiologic activity is seen within the heart and major abdominal vasculature.  IMPRESSION: Findings compatible with acute lower GI bleed with suspected area of bleeding within the proximal descending colon.  Critical Value/emergent results were called by telephone at the time of interpretation on 04/02/2015 at 3:05 am to Dr. Charlesetta Ivory , who verbally acknowledged these results.   Electronically Signed   By: Sandi Mariscal M.D.   On: 04/02/2015 03:07   US Venous Img Lower Unilateral Left  03/25/2015   CLINICAL DATA:  Left lower extremity pain for 1 day.  EXAM: Left LOWER EXTREMITY VENOUS DOPPLER ULTRASOUND  TECHNIQUE: Gray-scale sonography with graded compression, as well as color Doppler and duplex ultrasound were performed to evaluate the lower extremity deep venous systems from the level of the common femoral vein and including the common femoral, femoral, profunda femoral, popliteal and calf veins including the posterior tibial, peroneal and gastrocnemius veins when visible. The superficial great saphenous vein was also interrogated. Spectral Doppler was utilized to evaluate flow at rest and with distal augmentation maneuvers in the common femoral, femoral and popliteal veins.  COMPARISON:  None.  FINDINGS: Contralateral Common Femoral Vein: Respiratory phasicity is normal and symmetric with the symptomatic side. No evidence of thrombus.  Normal compressibility.  Common Femoral Vein: No evidence of thrombus. Normal compressibility, respiratory phasicity and response to augmentation.  Saphenofemoral Junction: No evidence of thrombus. Normal compressibility and flow on color Doppler imaging.  Profunda Femoral Vein: No evidence of  thrombus. Normal compressibility and flow on color Doppler imaging.  Femoral Vein: No evidence of thrombus. Normal compressibility, respiratory phasicity and response to augmentation.  Popliteal Vein: No evidence of thrombus. Normal compressibility, respiratory phasicity and response to augmentation.  Calf Veins: Posterior tibial and peroneal veins are not visualized.  Superficial Great Saphenous Vein: No evidence of thrombus. Normal compressibility and flow on color Doppler imaging.  Venous Reflux:  None.  Other Findings: Complex but predominantly hypoechoic abnormality is noted medially in the left calf which corresponds to palpable abnormality. It measures 5.1 x 2.2 x 3.6 cm and is most consistent with hematoma.  IMPRESSION: No definite evidence of deep venous thrombosis seen in left lower extremity, although calf veins are not visualized on this study. 5.1 cm complex hypoechoic abnormality is seen in the medial soft tissues of the left calf most consistent with hematoma ; but if there is no clinical evidence or history of traumatic injury, neoplasm cannot be excluded. MRI would be recommended in that setting.   Electronically Signed   By: Marijo Conception, M.D.   On: 03/25/2015 12:38   Dg Abd Acute W/chest  03/22/2015   CLINICAL DATA:  Left lower quadrant pain  EXAM: DG ABDOMEN ACUTE W/ 1V CHEST  COMPARISON:  None.  FINDINGS: The cardiac shadow is enlarged. The lungs are clear bilaterally. Degenerative changes of the shoulder joints are seen bilaterally. No free air is noted. Scattered large and small bowel gas is seen. Degenerative changes of lumbar spine are noted. Bilateral hip replacements are seen.  IMPRESSION: No  acute abnormality noted.   Electronically Signed   By: Inez Catalina M.D.   On: 03/22/2015 16:53     CBC  Recent Labs Lab 04/01/15 2230  04/02/15 1410  04/03/15 0131 04/03/15 0636 04/03/15 0753 04/03/15 1755 04/04/15 0415  WBC 4.8  --   --   --   --  13.0*  --  14.9* 14.0*  HGB 12.9  < > 8.8*  < > 10.5* 9.1* 8.8* 10.8* 9.3*  HCT 39.5  --  27.3*  --   --  27.4*  --  32.5* 28.5*  PLT 571*  --   --   --   --  608*  --  557* 546*  MCV 103.3*  --   --   --   --  101.4*  --  90.9 90.9  MCH 33.7  --   --   --   --  33.6  --  30.3 29.7  MCHC 32.6  --   --   --   --  33.1  --  33.3 32.7  RDW 16.4*  --   --   --   --  21.6*  --  28.2* 28.3*  LYMPHSABS 0.6*  --   --   --   --   --   --   --   --   MONOABS 0.3  --   --   --   --   --   --   --   --   EOSABS 0.1  --   --   --   --   --   --   --   --   BASOSABS 0.0  --   --   --   --   --   --   --   --   < > = values in this interval not displayed.  Chemistries   Recent Labs Lab 04/01/15 2230  04/03/15 0636 04/04/15 0415  NA 137 140 139  K 4.3 3.8 3.9  CL 108 114* 115*  CO2 24 23 23   GLUCOSE 130* 128* 171*  BUN 19 15 14   CREATININE 0.83 0.56 0.49  CALCIUM 8.2* 7.3* 7.3*  MG  --  1.6* 2.0  AST 15  --   --   ALT 7*  --   --   ALKPHOS 73  --   --   BILITOT 0.7  --   --    ------------------------------------------------------------------------------------------------------------------ estimated creatinine clearance is 68.3 mL/min (by C-G formula based on Cr of 0.49). ------------------------------------------------------------------------------------------------------------------ No results for input(s): HGBA1C in the last 72 hours. ------------------------------------------------------------------------------------------------------------------ No results for input(s): CHOL, HDL, LDLCALC, TRIG, CHOLHDL, LDLDIRECT in the last 72  hours. ------------------------------------------------------------------------------------------------------------------ No results for input(s): TSH, T4TOTAL, T3FREE, THYROIDAB in the last 72 hours.  Invalid input(s): FREET3 ------------------------------------------------------------------------------------------------------------------ No results for input(s): VITAMINB12, FOLATE, FERRITIN, TIBC, IRON, RETICCTPCT in the last 72 hours.  Coagulation profile  Recent Labs Lab 04/01/15 2230 04/03/15 0753  INR 1.25 1.34    No results for input(s): DDIMER in the last 72 hours.  Cardiac Enzymes No results for input(s): CKMB, TROPONINI, MYOGLOBIN in the last 168 hours.  Invalid input(s): CK ------------------------------------------------------------------------------------------------------------------ Invalid input(s): [place]   Assessment/Plan This is an 81 year old female with a lower GI bleed. 1. Lower GI bleed:  due to diverticular bleed, post hemicolectomy , started on clear liquid diet , transferred to the floor, PT evaluation and treatment 2. Polycythemia: The patient has been on hydroxyurea but is starting a radioactive treatment called P 32. MCV is elevated as expected with hydroxyurea therapy 3. Hypertension:On hold due to low blood pressure  4. DVT prophylaxis: SCDs      Code Status Orders        Start     Ordered   04/02/15 0607  Full code   Continuous     04/02/15 0606    Advance Directive Documentation        Most Recent Value   Type of Advance Directive  Healthcare Power of Attorney   Pre-existing out of facility DNR order (yellow form or pink MOST form)     "MOST" Form in Place?        Family Communication: Granddaughter at the bedside  Disposition Plan: Home   Procedures bleeding scan   Consults  Vascular, Gen. surgery   DVT Prophylaxis  SCDs   Lab Results  Component Value Date   PLT 546* 04/04/2015     Time  Spent in minutes  57min  minutes   Dustin Flock M.D on 04/04/2015 at 11:26 AM  Between 7am to 6pm - Pager - [phone]  After 6pm go to [email or web address] - password [email or web address] [place] [facility]   Office  [phone]

## 2015-04-04 NOTE — Care Management (Signed)
Patient required colectomy resulting in colostomy.  Discussed during progression rounds the need to either have PT order or get patient out of bed.  See now that physical therapy has evaluated and is recommending skilled nursing.  Patient has moved out of ICU. Made referral to clinical social work.  If patient does return directly home- will need home health nursing for ostomy follow up; physical therapy and aide

## 2015-04-05 LAB — BASIC METABOLIC PANEL
ANION GAP: 6 (ref 5–15)
BUN: 11 mg/dL (ref 6–20)
CHLORIDE: 110 mmol/L (ref 101–111)
CO2: 22 mmol/L (ref 22–32)
Calcium: 7.4 mg/dL — ABNORMAL LOW (ref 8.9–10.3)
Creatinine, Ser: 0.49 mg/dL (ref 0.44–1.00)
GFR calc Af Amer: >60 mL/min (ref >60)
GFR calc non Af Amer: >60 mL/min (ref >60)
Glucose, Bld: 119 mg/dL — ABNORMAL HIGH (ref 65–99)
POTASSIUM: 3.5 mmol/L (ref 3.5–5.1)
Sodium: 138 mmol/L (ref 135–145)

## 2015-04-05 LAB — TYPE AND SCREEN
ABO/RH(D): O POS
Antibody Screen: NEGATIVE
Unit division: 0
Unit division: 0
Unit division: 0
Unit division: 0
Unit division: 0

## 2015-04-05 LAB — CBC
HCT: 25.2 % — ABNORMAL LOW (ref 35.0–47.0)
Hemoglobin: 8.5 g/dL — ABNORMAL LOW (ref 12.0–16.0)
MCH: 30.7 pg (ref 26.0–34.0)
MCHC: 33.6 g/dL (ref 32.0–36.0)
MCV: 91.5 fL (ref 80.0–100.0)
Platelets: 470 10*3/uL — ABNORMAL HIGH (ref 150–440)
RBC: 2.75 MIL/uL — AB (ref 3.80–5.20)
RDW: 28.8 % — AB (ref 11.5–14.5)
WBC: 13.5 10*3/uL — AB (ref 3.6–11.0)

## 2015-04-05 NOTE — Progress Notes (Signed)
[place] at [facility]                                                                                                                                                                                            Patient Demographics   Yolanda Wells, is a 81 y.o. female, DOB - 1932-01-06, OZD:[id number]  Admit date - 04/01/2015   Admitting Physician Harrie Foreman, MD  Outpatient Primary MD for the patient is No primary care provider on file.  LOS - 3  Chief Complaint  Patient presents with  . Rectal Bleeding      Pt less confused today, some sob   Review of Systems:   CONSTITUTIONAL: No documented fever. No fatigue, weakness. No weight gain, no weight loss.  EYES: No blurry or double vision.  ENT: No tinnitus. No postnasal drip. No redness of the oropharynx.  RESPIRATORY: No cough, no wheeze, no hemoptysis. + dyspnea.  CARDIOVASCULAR: No chest pain. No orthopnea. No palpitations. No syncope.  GASTROINTESTINAL: No nausea, no vomiting or positive diarrhea. No abdominal pain. Bright red blood per rectum GENITOURINARY: No dysuria or hematuria.  ENDOCRINE: No polyuria or nocturia. No heat or cold intolerance.  HEMATOLOGY: Positive anemia. No bruising. No bleeding.  INTEGUMENTARY: No rashes. No lesions.  MUSCULOSKELETAL: No arthritis. No swelling. No gout.  NEUROLOGIC: No numbness, tingling, or ataxia. No seizure-type activity.  PSYCHIATRIC: No anxiety. No insomnia. No ADD.    Vitals:   Filed Vitals:   04/04/15 1300 04/04/15 1404 04/05/15 0500 04/05/15 0804  BP: 111/60 116/88  114/55  Pulse:  104  99  Temp:  98.4 F (36.9 C)  98.6 F (37 C)  TempSrc:  Oral  Oral  Resp:  18  16  Height:      Weight:   108.047 kg (238 lb 3.2 oz)   SpO2:  95%  100%    Wt Readings from Last 3 Encounters:  04/05/15 108.047 kg (238 lb 3.2 oz)  03/25/15 108.863 kg (240 lb)  03/22/15 108.863 kg (240 lb)     Intake/Output Summary (Last 24 hours) at  04/05/15 1202 Last data filed at 04/05/15 0819  Gross per 24 hour  Intake 3298.5 ml  Output    150 ml  Net 3148.5 ml    Physical Exam:   GENERAL: Pleasant-appearing in no apparent distress.  HEAD, EYES, EARS, NOSE AND THROAT: Atraumatic, normocephalic. Extraocular muscles are intact. Pupils equal and reactive to light. Sclerae anicteric. No conjunctival injection. No oro-pharyngeal erythema.  NECK: Supple. There is no jugular venous distention. No bruits, no lymphadenopathy, no thyromegaly.  HEART: Regular  rate and rhythm, tachycardic. No murmurs, no rubs, no clicks.  LUNGS: Clear to auscultation bilaterally. No rales or rhonchi. No wheezes.  ABDOMEN: Ostomy bag in place, has a bowel sounds, nontender EXTREMITIES: No evidence of any cyanosis, clubbing, or peripheral edema.  +2 pedal and radial pulses bilaterally.  NEUROLOGIC: The patient is alert, awake, and oriented x3 with no focal motor or sensory deficits appreciated bilaterally.  SKIN: Moist and warm with no rashes appreciated.  Psych: Not anxious, depressed LN: No inguinal LN enlargement    Antibiotics   Anti-infectives    Start     Dose/Rate Route Frequency Ordered Stop   04/03/15 0815  ertapenem (INVANZ) 1 g in sodium chloride 0.9 % 50 mL IVPB     1 g 100 mL/hr over 30 Minutes Intravenous  Once 04/03/15 0808 04/03/15 1019   04/02/15 0945  ceFAZolin (ANCEF) IVPB 1 g/50 mL premix    Comments:  Send with pt to OR   1 g 100 mL/hr over 30 Minutes Intravenous On call 04/02/15 0936 04/02/15 1327      Medications   Scheduled Meds:   Continuous Infusions:   PRN Meds:.morphine injection, [DISCONTINUED] ondansetron **OR** ondansetron (ZOFRAN) IV   Data Review:   Micro Results No results found for this or any previous visit (from the past 240 hour(s)).  Radiology Reports Nm Gi Blood Loss  04/02/2015   CLINICAL DATA:  Acute GI bleed.  EXAM: NUCLEAR MEDICINE GASTROINTESTINAL BLEEDING SCAN  TECHNIQUE: Sequential  abdominal images were obtained following intravenous administration of Tc-55m labeled red blood cells.  RADIOPHARMACEUTICALS:  20 mCi Tc-28m in-vitro labeled red cells.  COMPARISON:  CT abdomen pelvis - 07/08/2014  FINDINGS: There is abnormal radiotracer accumulation overlying the expected location of the descending colon within the left mid hemi abdomen. This activity persists and peristalsis with subsequent acquired images.  Physiologic activity is seen within the heart and major abdominal vasculature.  IMPRESSION: Findings compatible with acute lower GI bleed with suspected area of bleeding within the proximal descending colon.  Critical Value/emergent results were called by telephone at the time of interpretation on 04/02/2015 at 3:05 am to Dr. Charlesetta Ivory , who verbally acknowledged these results.   Electronically Signed   By: Sandi Mariscal M.D.   On: 04/02/2015 03:07   US Venous Img Lower Unilateral Left  03/25/2015   CLINICAL DATA:  Left lower extremity pain for 1 day.  EXAM: Left LOWER EXTREMITY VENOUS DOPPLER ULTRASOUND  TECHNIQUE: Gray-scale sonography with graded compression, as well as color Doppler and duplex ultrasound were performed to evaluate the lower extremity deep venous systems from the level of the common femoral vein and including the common femoral, femoral, profunda femoral, popliteal and calf veins including the posterior tibial, peroneal and gastrocnemius veins when visible. The superficial great saphenous vein was also interrogated. Spectral Doppler was utilized to evaluate flow at rest and with distal augmentation maneuvers in the common femoral, femoral and popliteal veins.  COMPARISON:  None.  FINDINGS: Contralateral Common Femoral Vein: Respiratory phasicity is normal and symmetric with the symptomatic side. No evidence of thrombus. Normal compressibility.  Common Femoral Vein: No evidence of thrombus. Normal compressibility, respiratory phasicity and response to augmentation.   Saphenofemoral Junction: No evidence of thrombus. Normal compressibility and flow on color Doppler imaging.  Profunda Femoral Vein: No evidence of thrombus. Normal compressibility and flow on color Doppler imaging.  Femoral Vein: No evidence of thrombus. Normal compressibility, respiratory phasicity and response to augmentation.  Popliteal Vein: No  evidence of thrombus. Normal compressibility, respiratory phasicity and response to augmentation.  Calf Veins: Posterior tibial and peroneal veins are not visualized.  Superficial Great Saphenous Vein: No evidence of thrombus. Normal compressibility and flow on color Doppler imaging.  Venous Reflux:  None.  Other Findings: Complex but predominantly hypoechoic abnormality is noted medially in the left calf which corresponds to palpable abnormality. It measures 5.1 x 2.2 x 3.6 cm and is most consistent with hematoma.  IMPRESSION: No definite evidence of deep venous thrombosis seen in left lower extremity, although calf veins are not visualized on this study. 5.1 cm complex hypoechoic abnormality is seen in the medial soft tissues of the left calf most consistent with hematoma ; but if there is no clinical evidence or history of traumatic injury, neoplasm cannot be excluded. MRI would be recommended in that setting.   Electronically Signed   By: Marijo Conception, M.D.   On: 03/25/2015 12:38   Dg Abd Acute W/chest  03/22/2015   CLINICAL DATA:  Left lower quadrant pain  EXAM: DG ABDOMEN ACUTE W/ 1V CHEST  COMPARISON:  None.  FINDINGS: The cardiac shadow is enlarged. The lungs are clear bilaterally. Degenerative changes of the shoulder joints are seen bilaterally. No free air is noted. Scattered large and small bowel gas is seen. Degenerative changes of lumbar spine are noted. Bilateral hip replacements are seen.  IMPRESSION: No acute abnormality noted.   Electronically Signed   By: Inez Catalina M.D.   On: 03/22/2015 16:53     CBC  Recent Labs Lab 04/01/15 2230   04/02/15 1410  04/03/15 0636 04/03/15 0753 04/03/15 1755 04/04/15 0415 04/05/15 1033  WBC 4.8  --   --   --  13.0*  --  14.9* 14.0* 13.5*  HGB 12.9  < > 8.8*  < > 9.1* 8.8* 10.8* 9.3* 8.5*  HCT 39.5  --  27.3*  --  27.4*  --  32.5* 28.5* 25.2*  PLT 571*  --   --   --  608*  --  557* 546* 470*  MCV 103.3*  --   --   --  101.4*  --  90.9 90.9 91.5  MCH 33.7  --   --   --  33.6  --  30.3 29.7 30.7  MCHC 32.6  --   --   --  33.1  --  33.3 32.7 33.6  RDW 16.4*  --   --   --  21.6*  --  28.2* 28.3* 28.8*  LYMPHSABS 0.6*  --   --   --   --   --   --   --   --   MONOABS 0.3  --   --   --   --   --   --   --   --   EOSABS 0.1  --   --   --   --   --   --   --   --   BASOSABS 0.0  --   --   --   --   --   --   --   --   < > = values in this interval not displayed.  Chemistries   Recent Labs Lab 04/01/15 2230 04/03/15 0636 04/04/15 0415  NA 137 140 139  K 4.3 3.8 3.9  CL 108 114* 115*  CO2 24 23 23   GLUCOSE 130* 128* 171*  BUN 19 15 14   CREATININE 0.83 0.56 0.49  CALCIUM 8.2*  7.3* 7.3*  MG  --  1.6* 2.0  AST 15  --   --   ALT 7*  --   --   ALKPHOS 73  --   --   BILITOT 0.7  --   --    ------------------------------------------------------------------------------------------------------------------ estimated creatinine clearance is 69.8 mL/min (by C-G formula based on Cr of 0.49). ------------------------------------------------------------------------------------------------------------------ No results for input(s): HGBA1C in the last 72 hours. ------------------------------------------------------------------------------------------------------------------ No results for input(s): CHOL, HDL, LDLCALC, TRIG, CHOLHDL, LDLDIRECT in the last 72 hours. ------------------------------------------------------------------------------------------------------------------ No results for input(s): TSH, T4TOTAL, T3FREE, THYROIDAB in the last 72 hours.  Invalid input(s):  FREET3 ------------------------------------------------------------------------------------------------------------------ No results for input(s): VITAMINB12, FOLATE, FERRITIN, TIBC, IRON, RETICCTPCT in the last 72 hours.  Coagulation profile  Recent Labs Lab 04/01/15 2230 04/03/15 0753  INR 1.25 1.34    No results for input(s): DDIMER in the last 72 hours.  Cardiac Enzymes No results for input(s): CKMB, TROPONINI, MYOGLOBIN in the last 168 hours.  Invalid input(s): CK ------------------------------------------------------------------------------------------------------------------ Invalid input(s): [place]   Assessment/Plan This is an 81 year old female with a lower GI bleed. 1. Lower GI bleed:  due to diverticular bleed, post hemicolectomy ,  Continue diet per surgery 2. Polycythemia: The patient has been on hydroxyurea but is starting a radioactive treatment called P 32. MCV is elevated as expected with hydroxyurea therapy 3. Hypertension:On hold due to low blood pressure  4.acute blood loss anemia due to - gi blood loss s/p transfusino 5. dvt proph- scd's      Code Status Orders        Start     Ordered   04/02/15 0607  Full code   Continuous     04/02/15 0606    Advance Directive Documentation        Most Recent Value   Type of Advance Directive  Healthcare Power of Attorney   Pre-existing out of facility DNR order (yellow form or pink MOST form)     "MOST" Form in Place?        Family Communication: Granddaughter at the bedside  Disposition Plan: Home   Procedures bleeding scan   Consults  Vascular, Gen. surgery   DVT Prophylaxis  SCDs   Lab Results  Component Value Date   PLT 470* 04/05/2015     Time Spent in minutes  13min  minutes   Dustin Flock M.D on 04/05/2015 at 12:02 PM  Between 7am to 6pm - Pager - [phone]  After 6pm go to [email or web address] - password [email or web address] [place] [facility]   Office   [phone]

## 2015-04-05 NOTE — Clinical Social Work Note (Signed)
Patient's nurse reporting patient is only oriented to person at this time. CSW called and had to leave message for patient's grandaughter: [name]: [phone] in order to discuss discharge plans and physical therapy recommendations for rehab. Shela Leff MSW,LCSWA [phone]

## 2015-04-05 NOTE — Progress Notes (Signed)
Postop day 2 status post left colectomy with end ileostomy she remains stable. There is no further bleeding noted.  Filed Vitals:   04/04/15 1300 04/04/15 1404 04/05/15 0500 04/05/15 0804  BP: 111/60 116/88  114/55  Pulse:  104  99  Temp:  98.4 F (36.9 C)  98.6 F (37 C)  TempSrc:  Oral  Oral  Resp:  18  16  Height:      Weight:   108.047 kg (238 lb 3.2 oz)   SpO2:  95%  100%   CBC Latest Ref Rng 04/05/2015 04/04/2015 04/03/2015  WBC 3.6 - 11.0 K/uL 13.5(H) 14.0(H) 14.9(H)  Hemoglobin 12.0 - 16.0 g/dL 8.5(L) 9.3(L) 10.8(L)  Hematocrit 35.0 - 47.0 % 25.2(L) 28.5(L) 32.5(L)  Platelets 150 - 440 K/uL 470(H) 546(H) 557(H)    BMP Latest Ref Rng 04/05/2015 04/04/2015 04/03/2015  Glucose 65 - 99 mg/dL 119(H) 171(H) 128(H)  BUN 6 - 20 mg/dL 11 14 15   Creatinine 0.44 - 1.00 mg/dL 0.49 0.49 0.56  Sodium 135 - 145 mmol/L 138 139 140  Potassium 3.5 - 5.1 mmol/L 3.5 3.9 3.8  Chloride 101 - 111 mmol/L 110 115(H) 114(H)  CO2 22 - 32 mmol/L 22 23 23   Calcium 8.9 - 10.3 mg/dL 7.4(L) 7.3(L) 7.3(L)    IMp stable  Plan:  Clears.  dispo planning.  No new recommendations.

## 2015-04-05 NOTE — Care Management (Signed)
Patient being followed by CSW at this time for SNF placement.

## 2015-04-05 NOTE — Progress Notes (Signed)
Physical Therapy Treatment Patient Details Name: Yolanda Wells MRN: [id number] DOB: [birth year] Today's Date: 04/05/2015    History of Present Illness Pt is an 81 y.o. female (with h/o R toe amp and B THR) admitted with acute lower GI bleed with positive bleeding scan.  Pt transferred to ICU 04/02/15 d/t significant hypotension and brisk bleeding and s/p 2 units PRBC's.  Pt s/p embolization 04/02/15; s/p colectomy with colostomy 04/03/15; s/p femoral central line insertion 04/03/15.     PT Comments    Pt able to progress to standing with max assist x2, RW, with bed elevated but unable to take any steps (B knees buckling).  Pt also with c/o nausea with standing (nursing notified).  Will continue to progress pt per pt tolerance.  Follow Up Recommendations  SNF     Equipment Recommendations       Recommendations for Other Services       Precautions / Restrictions Precautions Precautions: Fall Precaution Comments: R femoral central line (not for dialysis) Restrictions Weight Bearing Restrictions: No    Mobility  Bed Mobility Overal bed mobility: Needs Assistance;+2 for physical assistance Bed Mobility: Supine to Sit;Sit to Supine     Supine to sit: Mod assist;Max assist;+2 for physical assistance Sit to supine: Mod assist;Max assist;+2 for physical assistance   General bed mobility comments: via logrolling; assist for trunk and B LE's  Transfers Overall transfer level: Needs assistance Equipment used: Rolling walker (2 wheeled) Transfers: Sit to/from Stand Sit to Stand: Max assist;+2 physical assistance;From elevated surface         General transfer comment: Pt stood x30 seconds; x20 seconds with mod to max assist x2 to prevent knee buckling  Ambulation/Gait             General Gait Details: Pt unable to march in place with 2 assist and RW (B knees buckling)   Stairs            Wheelchair Mobility    Modified Rankin (Stroke Patients Only)        Balance                                    Cognition Arousal/Alertness: Awake/alert Behavior During Therapy: WFL for tasks assessed/performed Overall Cognitive Status: Within Functional Limits for tasks assessed                      Exercises      General Comments  Nursing cleared pt for participation in physical therapy.  Pt agreeable to PT session.      Pertinent Vitals/Pain Pain Assessment: Faces Faces Pain Scale: Hurts little more Pain Location: abdominal pain with sitting Pain Intervention(s): Limited activity within patient's tolerance;Monitored during session;Repositioned  Vitals: at rest HR 96 bpm; O2 94% on room air; after activity HR 110 bpm; O2 96% on room air.     Home Living                      Prior Function            PT Goals (current goals can now be found in the care plan section) Acute Rehab PT Goals Patient Stated Goal: To get stronger PT Goal Formulation: With patient Time For Goal Achievement: 04/18/15 Potential to Achieve Goals: Fair Additional Goals Additional Goal #1: Assess gait when appropriate and determine gait goals. Progress towards PT goals: Progressing toward  goals    Frequency  Min 2X/week    PT Plan Current plan remains appropriate    Co-evaluation             End of Session Equipment Utilized During Treatment: Gait belt Activity Tolerance: Patient limited by fatigue (Pt also c/o nausea) Patient left: in bed;with call bell/phone within reach;with bed alarm set;with SCD's reapplied (B heels elevated via pillow)     Time: 5974-1638 PT Time Calculation (min) (ACUTE ONLY): 24 min  Charges:  $Therapeutic Activity: 23-37 mins                    G CodesLeitha Wells Apr 06, 2015, 4:48 PM Yolanda Wells, [place]

## 2015-04-06 LAB — CBC
HEMATOCRIT: 25.6 % — AB (ref 35.0–47.0)
Hemoglobin: 8.3 g/dL — ABNORMAL LOW (ref 12.0–16.0)
MCH: 30.1 pg (ref 26.0–34.0)
MCHC: 32.6 g/dL (ref 32.0–36.0)
MCV: 92.4 fL (ref 80.0–100.0)
Platelets: 418 10*3/uL (ref 150–440)
RBC: 2.78 MIL/uL — AB (ref 3.80–5.20)
RDW: 28.3 % — AB (ref 11.5–14.5)
WBC: 12.5 10*3/uL — AB (ref 3.6–11.0)

## 2015-04-06 MED ORDER — FUROSEMIDE 10 MG/ML IJ SOLN
20.0000 mg | Freq: Two times a day (BID) | INTRAMUSCULAR | Status: DC
  Administered 2015-04-06: 20 mg via INTRAVENOUS
  Filled 2015-04-06: qty 2

## 2015-04-06 MED ORDER — DEXTROSE-NACL 5-0.45 % IV SOLN: INTRAVENOUS | Status: DC

## 2015-04-06 NOTE — Clinical Social Work Note (Signed)
Clinical Social Work Assessment  Patient Details  Name: Yolanda Wells MRN: [id number] Date of Birth: 1932-10-03  Date of referral:  04/05/15               Reason for consult:  Facility Placement                Permission sought to share information with:    Permission granted to share information::     Name::        Agency::     Relationship::     Contact Information:     Housing/Transportation Living arrangements for the past 2 months:   (home) Source of Information:  Other (Comment Required) Advertising account executive) Patient Interpreter Needed:  None Criminal Activity/Legal Involvement Pertinent to Current Situation/Hospitalization:  No - Comment as needed Significant Relationships:  Siblings, Other(Comment) Advertising account executive) Lives with:  Self Do you feel safe going back to the place where you live?    Need for family participation in patient care:  Yes (Comment)  Care giving concerns:  Patient unable to currently care for herself.   Social Worker assessment / plan:  CSW informed by patient's nurse that patient is alert to person only. CSW left message for patient's grandaughter, Yolanda Wells, yesterday and she returned my call this morning. Yolanda Wells is in agreement with physical therapy recommendation for short term rehab and states that she does not want [place], [place], or [facility] and that if those are the only options, she will choose to take patient home with her and care for her. She states she prefers [facility]. Bedsearch initiated and [facility] able to offer private room. Physician states patient probably will discharge tomorrow. Yolanda Wells is aware of this. Crystal at [facility] to contact granddaughter to do admission paperwork. Crystal at [facility] states discharge orders are not needed ahead of time and can be sent when patient discharges.  Employment status:  Retired Forensic scientist:  [employer] PT Recommendations:  [place] / Referral to community resources:   [place]  Patient/Family's Response to care:  Granddaughter very happy at offer at [employer].  Patient/Family's Understanding of and Emotional Response to Diagnosis, Current Treatment, and Prognosis:  Yolanda Wells verbalized understanding of plan for discharge tomorrow.   Emotional Assessment Appearance:  Appears stated age Attitude/Demeanor/Rapport:   (pleasant but confused) Affect (typically observed):  Pleasant Orientation:  Oriented to Self Alcohol / Substance use:  Not Applicable Psych involvement (Current and /or in the community):  No (Comment)  Discharge Needs  Concerns to be addressed:  Discharge Planning Concerns Readmission within the last 30 days:  No Current discharge risk:  None Barriers to Discharge:  No Barriers Identified   Shela Leff, LCSW 04/06/2015, 11:15 AM

## 2015-04-06 NOTE — Progress Notes (Signed)
Initial Nutrition Assessment  DOCUMENTATION CODES:     INTERVENTION:   (Medical Food Supplement Therapy: will send [employer] TID for added nutrition)  NUTRITION DIAGNOSIS:  Inadequate oral intake related to inability to eat as evidenced by  (NPO/CL 3 days, [place] diet order).  GOAL:  Patient will meet greater than or equal to 90% of their needs (Goal for diet advancement as medically able)  MONITOR:   (Energy Intake, Digestive System, Electrolyte and renal Profile)  REASON FOR ASSESSMENT:   (RD Screen, Length of Stay)    ASSESSMENT:  Pt admitted with GI bleed POD3 left colectomy and Hartman's pouch placement.  PMHx: Past Medical History  Diagnosis Date  . Hypertension   . Polycythemia vera(238.4)   . Deep venous thrombosis     right lower extremity  . History of hysterectomy   . History of bilateral hip replacements    PO Intake: pt somewhat confused at times on visit this am. Pt reports having apple juice this am. Pt reports 'good' appetite PTA.   Medications: D5 0.45%NS at 78mL/hr (providing 306kcals in 24 hours) Labs: Electrolyte and Renal Profile:    Recent Labs Lab 04/03/15 0636 04/04/15 0415 04/05/15 1033  BUN 15 14 11   CREATININE 0.56 0.49 0.49  NA 140 139 138  K 3.8 3.9 3.5  MG 1.6* 2.0  --   PHOS 3.4 2.1*  --    Glucose Profile:   Recent Labs  04/03/15 1622  GLUCAP 137*   CBC Latest Ref Rng 04/06/2015 04/05/2015 04/04/2015  WBC 3.6 - 11.0 K/uL 12.5(H) 13.5(H) 14.0(H)  Hemoglobin 12.0 - 16.0 g/dL 8.3(L) 8.5(L) 9.3(L)  Hematocrit 35.0 - 47.0 % 25.6(L) 25.2(L) 28.5(L)  Platelets 150 - 440 K/uL 418 470(H) 546(H)    Pt reports usual body weight of 130-140lbs. RD questions accuracy possibly pt meant 230-240lbs per past weight trend.  Height:  Ht Readings from Last 1 Encounters:  04/02/15 5\' 7"  (1.702 m)    Weight:  Wt Readings from Last 1 Encounters:  04/06/15 245 lb 14.4 oz (111.54 kg)    Ideal Body Weight:     Wt Readings from  Last 10 Encounters:  04/06/15 245 lb 14.4 oz (111.54 kg)  03/25/15 240 lb (108.863 kg)  03/22/15 240 lb (108.863 kg)   Filed Weights   04/04/15 0558 04/05/15 0500 04/06/15 0500  Weight: 228 lb 2.8 oz (103.5 kg) 238 lb 3.2 oz (108.047 kg) 245 lb 14.4 oz (111.54 kg)   BMI:  Body mass index is 38.5 kg/(m^2).  Skin:  Reviewed, no issues  Diet Order:  Diet full liquid Room service appropriate?: Yes; Fluid consistency:: Thin  EDUCATION NEEDS:  Education needs no appropriate at this time; will attempt nutrition therapy s/p ostomy on follow as able   Intake/Output Summary (Last 24 hours) at 04/06/15 1242 Last data filed at 04/06/15 1100  Gross per 24 hour  Intake    820 ml  Output     50 ml  Net    770 ml    Last BM:  62mL output documented via colostomy with pouch change per Nsg documentation  LOW Care Level  Dwyane Luo, RD, LDN Pager [phone]

## 2015-04-06 NOTE — Progress Notes (Signed)
POD 3 status post left colectomy and long Hartman's pouch for GI bleeding.  The patient has yet to have ostomy function  Pain seems well controlled  No further signs of LGIB seen since pre-op.  Filed Vitals:   04/05/15 1526 04/05/15 2355 04/06/15 0500 04/06/15 0809  BP: 137/65 130/64  117/61  Pulse: 96 100  98  Temp: 98.6 F (37 C) 98.2 F (36.8 C)  97.9 F (36.6 C)  TempSrc: Oral Oral  Oral  Resp: 18 20  19   Height:      Weight:   111.54 kg (245 lb 14.4 oz)   SpO2: 96% 97%  98%   CBC Latest Ref Rng 04/06/2015 04/05/2015 04/04/2015  WBC 3.6 - 11.0 K/uL 12.5(H) 13.5(H) 14.0(H)  Hemoglobin 12.0 - 16.0 g/dL 8.3(L) 8.5(L) 9.3(L)  Hematocrit 35.0 - 47.0 % 25.6(L) 25.2(L) 28.5(L)  Platelets 150 - 440 K/uL 418 470(H) 546(H)    BMP Latest Ref Rng 04/05/2015 04/04/2015 04/03/2015  Glucose 65 - 99 mg/dL 119(H) 171(H) 128(H)  BUN 6 - 20 mg/dL 11 14 15   Creatinine 0.44 - 1.00 mg/dL 0.49 0.49 0.56  Sodium 135 - 145 mmol/L 138 139 140  Potassium 3.5 - 5.1 mmol/L 3.5 3.9 3.8  Chloride 101 - 111 mmol/L 110 115(H) 114(H)  CO2 22 - 32 mmol/L 22 23 23   Calcium 8.9 - 10.3 mg/dL 7.4(L) 7.3(L) 7.3(L)    IMP:  She is stable. No signs of continued bleeding. Await return of bowel function.   Plan: Mobilization disposition planning likely to rehabilitation upon discharge. I will change her dressing tomorrow. Begin ostomy teaching.

## 2015-04-06 NOTE — Progress Notes (Signed)
[place] at [facility]                                                                                                                                                                                            Patient Demographics   Yolanda Wells, is a 81 y.o. female, DOB - [birth year], ZOX:[id number]  Admit date - 04/01/2015   Admitting Physician Harrie Foreman, MD  Outpatient Primary MD for the patient is No primary care provider on file.  LOS - 4  Chief Complaint  Patient presents with  . Rectal Bleeding      She complains of sharp shortness of breath, very weak physical therapy recommends rehabilitation  ay, some sob Review of Systems:   CONSTITUTIONAL: No documented fever. No fatigue, weakness. No weight gain, no weight loss.  EYES: No blurry or double vision.  ENT: No tinnitus. No postnasal drip. No redness of the oropharynx.  RESPIRATORY: No cough, no wheeze, no hemoptysis. + dyspnea.  CARDIOVASCULAR: No chest pain. No orthopnea. No palpitations. No syncope.  GASTROINTESTINAL: No nausea, no vomiting or positive diarrhea. No abdominal pain. Bright red blood per rectum GENITOURINARY: No dysuria or hematuria.  ENDOCRINE: No polyuria or nocturia. No heat or cold intolerance.  HEMATOLOGY: Positive anemia. No bruising. No bleeding.  INTEGUMENTARY: No rashes. No lesions.  MUSCULOSKELETAL: No arthritis. No swelling. No gout.  NEUROLOGIC: No numbness, tingling, or ataxia. No seizure-type activity.  PSYCHIATRIC: No anxiety. No insomnia. No ADD.    Vitals:   Filed Vitals:   04/05/15 1526 04/05/15 2355 04/06/15 0500 04/06/15 0809  BP: 137/65 130/64  117/61  Pulse: 96 100  98  Temp: 98.6 F (37 C) 98.2 F (36.8 C)  97.9 F (36.6 C)  TempSrc: Oral Oral  Oral  Resp: 18 20  19   Height:      Weight:   111.54 kg (245 lb 14.4 oz)   SpO2: 96% 97%  98%    Wt Readings from Last 3 Encounters:  04/06/15 111.54 kg (245 lb 14.4 oz)  03/25/15  108.863 kg (240 lb)  03/22/15 108.863 kg (240 lb)     Intake/Output Summary (Last 24 hours) at 04/06/15 1334 Last data filed at 04/06/15 1100  Gross per 24 hour  Intake    820 ml  Output     50 ml  Net    770 ml    Physical Exam:   GENERAL: Pleasant-appearing in no apparent distress.  HEAD, EYES, EARS, NOSE AND THROAT: Atraumatic, normocephalic. Extraocular muscles are intact. Pupils equal and reactive to light. Sclerae anicteric. No conjunctival injection. No oro-pharyngeal  erythema.  NECK: Supple. There is no jugular venous distention. No bruits, no lymphadenopathy, no thyromegaly.  HEART: Regular rate and rhythm, tachycardic. No murmurs, no rubs, no clicks.  LUNGS: Clear to auscultation bilaterally. No rales or rhonchi. No wheezes.  ABDOMEN: Ostomy bag in place, has a bowel sounds, nontender EXTREMITIES: No evidence of any cyanosis, clubbing, or peripheral edema.  +2 pedal and radial pulses bilaterally.  NEUROLOGIC: The patient is alert, awake, and oriented x3 with no focal motor or sensory deficits appreciated bilaterally.  SKIN: Moist and warm with no rashes appreciated.  Psych: Not anxious, depressed LN: No inguinal LN enlargement    Antibiotics   Anti-infectives    Start     Dose/Rate Route Frequency Ordered Stop   04/03/15 0815  ertapenem (INVANZ) 1 g in sodium chloride 0.9 % 50 mL IVPB     1 g 100 mL/hr over 30 Minutes Intravenous  Once 04/03/15 0808 04/03/15 1019   04/02/15 0945  ceFAZolin (ANCEF) IVPB 1 g/50 mL premix    Comments:  Send with pt to OR   1 g 100 mL/hr over 30 Minutes Intravenous On call 04/02/15 0936 04/02/15 1327      Medications   Scheduled Meds: . furosemide  20 mg Intravenous Q12H   Continuous Infusions:   PRN Meds:.morphine injection, [DISCONTINUED] ondansetron **OR** ondansetron (ZOFRAN) IV   Data Review:   Micro Results No results found for this or any previous visit (from the past 240 hour(s)).  Radiology Reports Nm Gi Blood  Loss  04/02/2015   CLINICAL DATA:  Acute GI bleed.  EXAM: NUCLEAR MEDICINE GASTROINTESTINAL BLEEDING SCAN  TECHNIQUE: Sequential abdominal images were obtained following intravenous administration of Tc-17m labeled red blood cells.  RADIOPHARMACEUTICALS:  20 mCi Tc-60m in-vitro labeled red cells.  COMPARISON:  CT abdomen pelvis - 07/08/2014  FINDINGS: There is abnormal radiotracer accumulation overlying the expected location of the descending colon within the left mid hemi abdomen. This activity persists and peristalsis with subsequent acquired images.  Physiologic activity is seen within the heart and major abdominal vasculature.  IMPRESSION: Findings compatible with acute lower GI bleed with suspected area of bleeding within the proximal descending colon.  Critical Value/emergent results were called by telephone at the time of interpretation on 04/02/2015 at 3:05 am to Dr. Charlesetta Ivory , who verbally acknowledged these results.   Electronically Signed   By: Sandi Mariscal M.D.   On: 04/02/2015 03:07   US Venous Img Lower Unilateral Left  03/25/2015   CLINICAL DATA:  Left lower extremity pain for 1 day.  EXAM: Left LOWER EXTREMITY VENOUS DOPPLER ULTRASOUND  TECHNIQUE: Gray-scale sonography with graded compression, as well as color Doppler and duplex ultrasound were performed to evaluate the lower extremity deep venous systems from the level of the common femoral vein and including the common femoral, femoral, profunda femoral, popliteal and calf veins including the posterior tibial, peroneal and gastrocnemius veins when visible. The superficial great saphenous vein was also interrogated. Spectral Doppler was utilized to evaluate flow at rest and with distal augmentation maneuvers in the common femoral, femoral and popliteal veins.  COMPARISON:  None.  FINDINGS: Contralateral Common Femoral Vein: Respiratory phasicity is normal and symmetric with the symptomatic side. No evidence of thrombus. Normal  compressibility.  Common Femoral Vein: No evidence of thrombus. Normal compressibility, respiratory phasicity and response to augmentation.  Saphenofemoral Junction: No evidence of thrombus. Normal compressibility and flow on color Doppler imaging.  Profunda Femoral Vein: No evidence of thrombus. Normal  compressibility and flow on color Doppler imaging.  Femoral Vein: No evidence of thrombus. Normal compressibility, respiratory phasicity and response to augmentation.  Popliteal Vein: No evidence of thrombus. Normal compressibility, respiratory phasicity and response to augmentation.  Calf Veins: Posterior tibial and peroneal veins are not visualized.  Superficial Great Saphenous Vein: No evidence of thrombus. Normal compressibility and flow on color Doppler imaging.  Venous Reflux:  None.  Other Findings: Complex but predominantly hypoechoic abnormality is noted medially in the left calf which corresponds to palpable abnormality. It measures 5.1 x 2.2 x 3.6 cm and is most consistent with hematoma.  IMPRESSION: No definite evidence of deep venous thrombosis seen in left lower extremity, although calf veins are not visualized on this study. 5.1 cm complex hypoechoic abnormality is seen in the medial soft tissues of the left calf most consistent with hematoma ; but if there is no clinical evidence or history of traumatic injury, neoplasm cannot be excluded. MRI would be recommended in that setting.   Electronically Signed   By: Marijo Conception, M.D.   On: 03/25/2015 12:38   Dg Abd Acute W/chest  03/22/2015   CLINICAL DATA:  Left lower quadrant pain  EXAM: DG ABDOMEN ACUTE W/ 1V CHEST  COMPARISON:  None.  FINDINGS: The cardiac shadow is enlarged. The lungs are clear bilaterally. Degenerative changes of the shoulder joints are seen bilaterally. No free air is noted. Scattered large and small bowel gas is seen. Degenerative changes of lumbar spine are noted. Bilateral hip replacements are seen.  IMPRESSION: No acute  abnormality noted.   Electronically Signed   By: Inez Catalina M.D.   On: 03/22/2015 16:53     CBC  Recent Labs Lab 04/01/15 2230  04/03/15 0636 04/03/15 0753 04/03/15 1755 04/04/15 0415 04/05/15 1033 04/06/15 0420  WBC 4.8  --  13.0*  --  14.9* 14.0* 13.5* 12.5*  HGB 12.9  < > 9.1* 8.8* 10.8* 9.3* 8.5* 8.3*  HCT 39.5  < > 27.4*  --  32.5* 28.5* 25.2* 25.6*  PLT 571*  --  608*  --  557* 546* 470* 418  MCV 103.3*  --  101.4*  --  90.9 90.9 91.5 92.4  MCH 33.7  --  33.6  --  30.3 29.7 30.7 30.1  MCHC 32.6  --  33.1  --  33.3 32.7 33.6 32.6  RDW 16.4*  --  21.6*  --  28.2* 28.3* 28.8* 28.3*  LYMPHSABS 0.6*  --   --   --   --   --   --   --   MONOABS 0.3  --   --   --   --   --   --   --   EOSABS 0.1  --   --   --   --   --   --   --   BASOSABS 0.0  --   --   --   --   --   --   --   < > = values in this interval not displayed.  Chemistries   Recent Labs Lab 04/01/15 2230 04/03/15 0636 04/04/15 0415 04/05/15 1033  NA 137 140 139 138  K 4.3 3.8 3.9 3.5  CL 108 114* 115* 110  CO2 24 23 23 22   GLUCOSE 130* 128* 171* 119*  BUN 19 15 14 11   CREATININE 0.83 0.56 0.49 0.49  CALCIUM 8.2* 7.3* 7.3* 7.4*  MG  --  1.6* 2.0  --  AST 15  --   --   --   ALT 7*  --   --   --   ALKPHOS 73  --   --   --   BILITOT 0.7  --   --   --    ------------------------------------------------------------------------------------------------------------------ estimated creatinine clearance is 71 mL/min (by C-G formula based on Cr of 0.49). ------------------------------------------------------------------------------------------------------------------ No results for input(s): HGBA1C in the last 72 hours. ------------------------------------------------------------------------------------------------------------------ No results for input(s): CHOL, HDL, LDLCALC, TRIG, CHOLHDL, LDLDIRECT in the last 72  hours. ------------------------------------------------------------------------------------------------------------------ No results for input(s): TSH, T4TOTAL, T3FREE, THYROIDAB in the last 72 hours.  Invalid input(s): FREET3 ------------------------------------------------------------------------------------------------------------------ No results for input(s): VITAMINB12, FOLATE, FERRITIN, TIBC, IRON, RETICCTPCT in the last 72 hours.  Coagulation profile  Recent Labs Lab 04/01/15 2230 04/03/15 0753  INR 1.25 1.34    No results for input(s): DDIMER in the last 72 hours.  Cardiac Enzymes No results for input(s): CKMB, TROPONINI, MYOGLOBIN in the last 168 hours.  Invalid input(s): CK ------------------------------------------------------------------------------------------------------------------ Invalid input(s): [place]   Assessment/Plan This is an 81 year old female with a lower GI bleed. 1. Lower GI bleed:  due to diverticular bleed, post hemicolectomy ,  Continue diet per surgery 2. Polycythemia: The patient has been on hydroxyurea but is starting a radioactive treatment called P 32. MCV is elevated as expected with hydroxyurea therapy, currently on hold 3. Hypertension:On hold due to low blood pressure  4.acute blood loss anemia due to - acute gi blood loss s/p transfusion 5. Sob- check cxr, iv lasix, stop fluid, incentive spirometer      Code Status Orders        Start     Ordered   04/02/15 0607  Full code   Continuous     04/02/15 0606    Advance Directive Documentation        Most Recent Value   Type of Advance Directive  Healthcare Power of Attorney   Pre-existing out of facility DNR order (yellow form or pink MOST form)     "MOST" Form in Place?        Family Communication: Granddaughter at the bedside  Disposition Plan: Home   Procedures bleeding scan   Consults  Vascular, Gen. surgery   DVT Prophylaxis  SCDs   Lab  Results  Component Value Date   PLT 418 04/06/2015     Time Spent in minutes  52min  minutes   Dustin Flock M.D on 04/06/2015 at 1:34 PM  Between 7am to 6pm - Pager - [phone]  After 6pm go to [email or web address] - password [email or web address] [facility]   Office  [phone]

## 2015-04-07 ENCOUNTER — Inpatient Hospital Stay: Payer: Medicare Other

## 2015-04-07 LAB — BASIC METABOLIC PANEL
Anion gap: 5 (ref 5–15)
BUN: 7 mg/dL (ref 6–20)
CALCIUM: 7.4 mg/dL — AB (ref 8.9–10.3)
CHLORIDE: 106 mmol/L (ref 101–111)
CO2: 28 mmol/L (ref 22–32)
Creatinine, Ser: 0.48 mg/dL (ref 0.44–1.00)
GFR calc Af Amer: >60 mL/min (ref >60)
GFR calc non Af Amer: >60 mL/min (ref >60)
GLUCOSE: 122 mg/dL — AB (ref 65–99)
Potassium: 3.3 mmol/L — ABNORMAL LOW (ref 3.5–5.1)
Sodium: 139 mmol/L (ref 135–145)

## 2015-04-07 LAB — CBC
HCT: 24.6 % — ABNORMAL LOW (ref 35.0–47.0)
Hemoglobin: 8.3 g/dL — ABNORMAL LOW (ref 12.0–16.0)
MCH: 30.8 pg (ref 26.0–34.0)
MCHC: 33.6 g/dL (ref 32.0–36.0)
MCV: 91.6 fL (ref 80.0–100.0)
PLATELETS: 319 10*3/uL (ref 150–440)
RBC: 2.68 MIL/uL — ABNORMAL LOW (ref 3.80–5.20)
RDW: 27.7 % — ABNORMAL HIGH (ref 11.5–14.5)
WBC: 9.2 10*3/uL (ref 3.6–11.0)

## 2015-04-07 MED ORDER — FUROSEMIDE 20 MG PO TABS
20.0000 mg | ORAL_TABLET | Freq: Two times a day (BID) | ORAL | Status: DC
  Administered 2015-04-07 – 2015-04-08 (×3): 20 mg via ORAL
  Filled 2015-04-07 (×3): qty 1

## 2015-04-07 MED ORDER — POTASSIUM CHLORIDE CRYS ER 20 MEQ PO TBCR
40.0000 meq | EXTENDED_RELEASE_TABLET | Freq: Once | ORAL | Status: AC
  Administered 2015-04-07: 40 meq via ORAL
  Filled 2015-04-07: qty 2

## 2015-04-07 NOTE — Progress Notes (Signed)
[place] [facility]                                                                                                                                                                                            Patient Demographics   Yolanda Wells, is a 81 y.o. female, DOB - 1931/12/12, POE:[id number]  Admit date - 04/01/2015   Admitting Physician Harrie Foreman, MD  Outpatient Primary MD for the patient is No primary care provider on file.  LOS - 5  Chief Complaint  Patient presents with  . Rectal Bleeding      Patient's breathing is improved, denies any chest pains.   Review of Systems:   CONSTITUTIONAL: No documented fever. No fatigue, weakness. No weight gain, no weight loss.  EYES: No blurry or double vision.  ENT: No tinnitus. No postnasal drip. No redness of the oropharynx.  RESPIRATORY: No cough, no wheeze, no hemoptysis. + dyspnea.  CARDIOVASCULAR: No chest pain. No orthopnea. No palpitations. No syncope.  GASTROINTESTINAL: No nausea, no vomiting or positive diarrhea. No abdominal pain. Bright red blood per rectum GENITOURINARY: No dysuria or hematuria.  ENDOCRINE: No polyuria or nocturia. No heat or cold intolerance.  HEMATOLOGY: Positive anemia. No bruising. No bleeding.  INTEGUMENTARY: No rashes. No lesions.  MUSCULOSKELETAL: No arthritis. No swelling. No gout.  NEUROLOGIC: No numbness, tingling, or ataxia. No seizure-type activity.  PSYCHIATRIC: No anxiety. No insomnia. No ADD.    Vitals:   Filed Vitals:   04/06/15 1537 04/06/15 2333 04/07/15 0633 04/07/15 0658  BP: 125/74 140/51  114/57  Pulse: 101   87  Temp: 98.6 F (37 C) 98.7 F (37.1 C)  98.1 F (36.7 C)  TempSrc: Oral Oral  Oral  Resp: 21 18  16   Height:      Weight:   106.595 kg (235 lb)   SpO2: 98% 98%  100%    Wt Readings from Last 3 Encounters:  04/07/15 106.595 kg (235 lb)  03/25/15 108.863 kg (240 lb)  03/22/15 108.863 kg (240 lb)      Intake/Output Summary (Last 24 hours) at 04/07/15 1208 Last data filed at 04/07/15 0900  Gross per 24 hour  Intake   1380 ml  Output     50 ml  Net   1330 ml    Physical Exam:   GENERAL: Pleasant-appearing in no apparent distress.  HEAD, EYES, EARS, NOSE AND THROAT: Atraumatic, normocephalic. Extraocular muscles are intact. Pupils equal and reactive to light. Sclerae anicteric. No conjunctival injection. No oro-pharyngeal erythema.  NECK: Supple. There is no jugular venous distention. No bruits, no  lymphadenopathy, no thyromegaly.  HEART: Regular rate and rhythm, tachycardic. No murmurs, no rubs, no clicks.  LUNGS: Clear to auscultation bilaterally. No rales or rhonchi. No wheezes.  ABDOMEN: Ostomy bag in place, has a bowel sounds, nontender EXTREMITIES: No evidence of any cyanosis, clubbing, or peripheral edema.  +2 pedal and radial pulses bilaterally.  NEUROLOGIC: The patient is alert, awake, and oriented x3 with no focal motor or sensory deficits appreciated bilaterally.  SKIN: Moist and warm with no rashes appreciated.  Psych: Not anxious, depressed LN: No inguinal LN enlargement    Antibiotics   Anti-infectives    Start     Dose/Rate Route Frequency Ordered Stop   04/03/15 0815  ertapenem (INVANZ) 1 g in sodium chloride 0.9 % 50 mL IVPB     1 g 100 mL/hr over 30 Minutes Intravenous  Once 04/03/15 0808 04/03/15 1019   04/02/15 0945  ceFAZolin (ANCEF) IVPB 1 g/50 mL premix    Comments:  Send with pt to OR   1 g 100 mL/hr over 30 Minutes Intravenous On call 04/02/15 0936 04/02/15 1327      Medications   Scheduled Meds: . furosemide  20 mg Oral BID   Continuous Infusions:   PRN Meds:.morphine injection, [DISCONTINUED] ondansetron **OR** ondansetron (ZOFRAN) IV   Data Review:   Micro Results No results found for this or any previous visit (from the past 240 hour(s)).  Radiology Reports Dg Chest 1 View  04/07/2015   CLINICAL DATA:  Shortness of breath.   EXAM: CHEST  1 VIEW  COMPARISON:  03/22/2015  FINDINGS: The heart size is moderately enlarged. Aortic atherosclerosis noted. Lung volumes are low and there is a small left effusion. Atelectasis is noted in the left base.  IMPRESSION: 1. Suspect small left pleural effusion. 2. Cardiac enlargement 3. Aortic atherosclerosis.   Electronically Signed   By: Kerby Moors M.D.   On: 04/07/2015 09:28   Nm Gi Blood Loss  04/02/2015   CLINICAL DATA:  Acute GI bleed.  EXAM: NUCLEAR MEDICINE GASTROINTESTINAL BLEEDING SCAN  TECHNIQUE: Sequential abdominal images were obtained following intravenous administration of Tc-107m labeled red blood cells.  RADIOPHARMACEUTICALS:  20 mCi Tc-22m in-vitro labeled red cells.  COMPARISON:  CT abdomen pelvis - 07/08/2014  FINDINGS: There is abnormal radiotracer accumulation overlying the expected location of the descending colon within the left mid hemi abdomen. This activity persists and peristalsis with subsequent acquired images.  Physiologic activity is seen within the heart and major abdominal vasculature.  IMPRESSION: Findings compatible with acute lower GI bleed with suspected area of bleeding within the proximal descending colon.  Critical Value/emergent results were called by telephone at the time of interpretation on 04/02/2015 at 3:05 am to Dr. Charlesetta Ivory , who verbally acknowledged these results.   Electronically Signed   By: Sandi Mariscal M.D.   On: 04/02/2015 03:07   US Venous Img Lower Unilateral Left  03/25/2015   CLINICAL DATA:  Left lower extremity pain for 1 day.  EXAM: Left LOWER EXTREMITY VENOUS DOPPLER ULTRASOUND  TECHNIQUE: Gray-scale sonography with graded compression, as well as color Doppler and duplex ultrasound were performed to evaluate the lower extremity deep venous systems from the level of the common femoral vein and including the common femoral, femoral, profunda femoral, popliteal and calf veins including the posterior tibial, peroneal and  gastrocnemius veins when visible. The superficial great saphenous vein was also interrogated. Spectral Doppler was utilized to evaluate flow at rest and with distal augmentation maneuvers in the  common femoral, femoral and popliteal veins.  COMPARISON:  None.  FINDINGS: Contralateral Common Femoral Vein: Respiratory phasicity is normal and symmetric with the symptomatic side. No evidence of thrombus. Normal compressibility.  Common Femoral Vein: No evidence of thrombus. Normal compressibility, respiratory phasicity and response to augmentation.  Saphenofemoral Junction: No evidence of thrombus. Normal compressibility and flow on color Doppler imaging.  Profunda Femoral Vein: No evidence of thrombus. Normal compressibility and flow on color Doppler imaging.  Femoral Vein: No evidence of thrombus. Normal compressibility, respiratory phasicity and response to augmentation.  Popliteal Vein: No evidence of thrombus. Normal compressibility, respiratory phasicity and response to augmentation.  Calf Veins: Posterior tibial and peroneal veins are not visualized.  Superficial Great Saphenous Vein: No evidence of thrombus. Normal compressibility and flow on color Doppler imaging.  Venous Reflux:  None.  Other Findings: Complex but predominantly hypoechoic abnormality is noted medially in the left calf which corresponds to palpable abnormality. It measures 5.1 x 2.2 x 3.6 cm and is most consistent with hematoma.  IMPRESSION: No definite evidence of deep venous thrombosis seen in left lower extremity, although calf veins are not visualized on this study. 5.1 cm complex hypoechoic abnormality is seen in the medial soft tissues of the left calf most consistent with hematoma ; but if there is no clinical evidence or history of traumatic injury, neoplasm cannot be excluded. MRI would be recommended in that setting.   Electronically Signed   By: Marijo Conception, M.D.   On: 03/25/2015 12:38   Dg Abd Acute W/chest  03/22/2015    CLINICAL DATA:  Left lower quadrant pain  EXAM: DG ABDOMEN ACUTE W/ 1V CHEST  COMPARISON:  None.  FINDINGS: The cardiac shadow is enlarged. The lungs are clear bilaterally. Degenerative changes of the shoulder joints are seen bilaterally. No free air is noted. Scattered large and small bowel gas is seen. Degenerative changes of lumbar spine are noted. Bilateral hip replacements are seen.  IMPRESSION: No acute abnormality noted.   Electronically Signed   By: Inez Catalina M.D.   On: 03/22/2015 16:53     CBC  Recent Labs Lab 04/01/15 2230  04/03/15 1755 04/04/15 0415 04/05/15 1033 04/06/15 0420 04/07/15 0506  WBC 4.8  < > 14.9* 14.0* 13.5* 12.5* 9.2  HGB 12.9  < > 10.8* 9.3* 8.5* 8.3* 8.3*  HCT 39.5  < > 32.5* 28.5* 25.2* 25.6* 24.6*  PLT 571*  < > 557* 546* 470* 418 319  MCV 103.3*  < > 90.9 90.9 91.5 92.4 91.6  MCH 33.7  < > 30.3 29.7 30.7 30.1 30.8  MCHC 32.6  < > 33.3 32.7 33.6 32.6 33.6  RDW 16.4*  < > 28.2* 28.3* 28.8* 28.3* 27.7*  LYMPHSABS 0.6*  --   --   --   --   --   --   MONOABS 0.3  --   --   --   --   --   --   EOSABS 0.1  --   --   --   --   --   --   BASOSABS 0.0  --   --   --   --   --   --   < > = values in this interval not displayed.  Chemistries   Recent Labs Lab 04/01/15 2230 04/03/15 0636 04/04/15 0415 04/05/15 1033 04/07/15 0506  NA 137 140 139 138 139  K 4.3 3.8 3.9 3.5 3.3*  CL 108 114* 115* 110 106  CO2 24 23 23 22 28   GLUCOSE 130* 128* 171* 119* 122*  BUN 19 15 14 11 7   CREATININE 0.83 0.56 0.49 0.49 0.48  CALCIUM 8.2* 7.3* 7.3* 7.4* 7.4*  MG  --  1.6* 2.0  --   --   AST 15  --   --   --   --   ALT 7*  --   --   --   --   ALKPHOS 73  --   --   --   --   BILITOT 0.7  --   --   --   --    ------------------------------------------------------------------------------------------------------------------ estimated creatinine clearance is 69.3 mL/min (by C-G formula based on Cr of  0.48). ------------------------------------------------------------------------------------------------------------------ No results for input(s): HGBA1C in the last 72 hours. ------------------------------------------------------------------------------------------------------------------ No results for input(s): CHOL, HDL, LDLCALC, TRIG, CHOLHDL, LDLDIRECT in the last 72 hours. ------------------------------------------------------------------------------------------------------------------ No results for input(s): TSH, T4TOTAL, T3FREE, THYROIDAB in the last 72 hours.  Invalid input(s): FREET3 ------------------------------------------------------------------------------------------------------------------ No results for input(s): VITAMINB12, FOLATE, FERRITIN, TIBC, IRON, RETICCTPCT in the last 72 hours.  Coagulation profile  Recent Labs Lab 04/01/15 2230 04/03/15 0753  INR 1.25 1.34    No results for input(s): DDIMER in the last 72 hours.  Cardiac Enzymes No results for input(s): CKMB, TROPONINI, MYOGLOBIN in the last 168 hours.  Invalid input(s): CK ------------------------------------------------------------------------------------------------------------------ Invalid input(s): [place]   Assessment/Plan This is an 81 year old female with a lower GI bleed. 1. Lower GI bleed:  due to diverticular bleed, post hemicolectomy ,  Continue diet per surgery 2. Polycythemia: The patient has been on hydroxyurea but is starting a radioactive treatment called P 32. MCV is elevated as expected with hydroxyurea therapy, currently on hold 3. Hypertension:On hold due to low blood pressure  4.acute blood loss anemia due to - acute gi blood loss s/p transfusion 5. Sob- chest x-ray without any significant abnormality. Possible fluid overload, changed to by mouth Lasix. If continues to have shortness of breath symptoms we'll check a CT scan of the chest to rule out PE.        Code Status Orders        Start     Ordered   04/02/15 0607  Full code   Continuous     04/02/15 0606    Advance Directive Documentation        Most Recent Value   Type of Advance Directive  Healthcare Power of Attorney   Pre-existing out of facility DNR order (yellow form or pink MOST form)     "MOST" Form in Place?        Family Communication: Granddaughter at the bedside  Disposition Plan: Home   Procedures bleeding scan   Consults  Vascular, Gen. surgery   DVT Prophylaxis  SCDs   Lab Results  Component Value Date   PLT 319 04/07/2015     Time Spent in minutes  92min  minutes   Dustin Flock M.D on 04/07/2015 at 12:08 PM  Between 7am to 6pm - Pager - [phone]  After 6pm go to [email or web address] - password [email or web address] [facility]   Office  [phone]

## 2015-04-07 NOTE — Progress Notes (Signed)
Surgery  POD 4   Bowel movement via ostomy last night. She is alert and oriented no complaints of pain. No further bleeding has been noted.  Filed Vitals:   04/06/15 1537 04/06/15 2333 04/07/15 0633 04/07/15 0658  BP: 125/74 140/51  114/57  Pulse: 101   87  Temp: 98.6 F (37 C) 98.7 F (37.1 C)  98.1 F (36.7 C)  TempSrc: Oral Oral  Oral  Resp: 21 18  16   Height:      Weight:   106.595 kg (235 lb)   SpO2: 98% 98%  100%    PE: Wound is clean dry and intact ostomy is viable and functional.  Labs:  CBC Latest Ref Rng 04/07/2015 04/06/2015 04/05/2015  WBC 3.6 - 11.0 K/uL 9.2 12.5(H) 13.5(H)  Hemoglobin 12.0 - 16.0 g/dL 8.3(L) 8.3(L) 8.5(L)  Hematocrit 35.0 - 47.0 % 24.6(L) 25.6(L) 25.2(L)  Platelets 150 - 440 K/uL 319 418 470(H)      IMP  patient is stable postoperative day #4 status post colectomy for bleeding with colostomy.  Plan advance diet

## 2015-04-08 LAB — BASIC METABOLIC PANEL
Anion gap: 5 (ref 5–15)
BUN: 5 mg/dL — ABNORMAL LOW (ref 6–20)
CHLORIDE: 103 mmol/L (ref 101–111)
CO2: 29 mmol/L (ref 22–32)
Calcium: 7.4 mg/dL — ABNORMAL LOW (ref 8.9–10.3)
Creatinine, Ser: 0.43 mg/dL — ABNORMAL LOW (ref 0.44–1.00)
GFR calc Af Amer: >60 mL/min (ref >60)
GFR calc non Af Amer: >60 mL/min (ref >60)
GLUCOSE: 104 mg/dL — AB (ref 65–99)
Potassium: 3.5 mmol/L (ref 3.5–5.1)
SODIUM: 137 mmol/L (ref 135–145)

## 2015-04-08 MED ORDER — ONDANSETRON HCL 4 MG/2ML IJ SOLN: 4.0000 mg | Freq: Four times a day (QID) | INTRAMUSCULAR | Status: DC | PRN

## 2015-04-08 MED ORDER — OXYCODONE-ACETAMINOPHEN 2.5-325 MG PO TABS: 1.0000 | ORAL_TABLET | ORAL | Status: DC | PRN

## 2015-04-08 NOTE — Progress Notes (Signed)
Pt for d/c to [facility] today via EMS. Pt and pt's granddtr aware of d/c and report agreeable. Orders sent to [place] at [facility] who confirms they are able to accept pt today. Packet complete and RN to call report. CSW signing off. Wandra Feinstein, MSW, LCSW [phone] (weekend coverage)

## 2015-04-08 NOTE — Discharge Instructions (Signed)
°  DIET:  Cardiac diet  DISCHARGE CONDITION:  Stable  ACTIVITY:  Activity as tolerated , PT EVAL AND TREAT  OXYGEN:  Home Oxygen: No.   Oxygen Delivery: room air  DISCHARGE LOCATION:  Rehab , snf   Nursing:  Ostomy care   If you experience worsening of your admission symptoms, develop shortness of breath, life threatening emergency, suicidal or homicidal thoughts you must seek medical attention immediately by calling 911 or calling your MD immediately  if symptoms less severe.  You Must read complete instructions/literature along with all the possible adverse reactions/side effects for all the Medicines you take and that have been prescribed to you. Take any new Medicines after you have completely understood and accpet all the possible adverse reactions/side effects.   Please note  You were cared for by a hospitalist during your hospital stay. If you have any questions about your discharge medications or the care you received while you were in the hospital after you are discharged, you can call the unit and asked to speak with the hospitalist on call if the hospitalist that took care of you is not available. Once you are discharged, your primary care physician will handle any further medical issues. Please note that NO REFILLS for any discharge medications will be authorized once you are discharged, as it is imperative that you return to your primary care physician (or establish a relationship with a primary care physician if you do not have one) for your aftercare needs so that they can reassess your need for medications and monitor your lab values.

## 2015-04-08 NOTE — Progress Notes (Signed)
Surgery  POD 5  S/P left colectomy with end colostomy  No complaints  Filed Vitals:   04/07/15 1638 04/08/15 0009 04/08/15 0448 04/08/15 0730  BP: 121/56 112/58  120/56  Pulse: 90 86  87  Temp: 98.5 F (36.9 C) 99.7 F (37.6 C)  98.8 F (37.1 C)  TempSrc: Oral Oral  Oral  Resp:  20  17  Height:      Weight:   105.915 kg (233 lb 8 oz)   SpO2: 99% 97%  100%    PE: Ostomy is functional wound is clean dry and intact.  Labs no new labs today  IMP  doing well postop day #5 status post colectomy for massive lower GI bleeding.  Plan:   From a surgical perspective the patient is suitable for discharge. I will see her back this coming Friday for staple removal.

## 2015-04-08 NOTE — Discharge Summary (Signed)
Yolanda Wells, 81 y.o., DOB January 22, 1932, MRN [id number]. Admission date: 04/01/2015 Discharge Date 04/08/2015 Primary MD No primary care provider on file. Admitting Physician Harrie Foreman, MD  Admission Diagnosis  GI bleed [K92.2] Gastrointestinal bleeding, lower [K92.2]  Discharge Diagnosis   Active Problems:   GI bleed  Hypotention  Acute blood loss anemia  h/o HTN  h/o Polycythemia Vera  h/o Bilateral hip replacement       Hospital Course  :The patient presents emergency department after a painless grossly bloody bowel movements. She states that she has not had any nausea or vomiting diarrhea chest pain or shortness of breath.  Pt had bleeding scan which was positive. Pt continued to have bleeding, was seen by Dew and undeewent catheter placement into splenic flexure branch of inferior mesenteric artery ,aortogram and selective IMA arteriogram, and micro-bead embolization with 300-500  polyvinyl alcohol beads to the splenic flexure branch of the inferior mesenteric artery. Despite this patient continued to have active bleeding or for urgent surgical consult was obtained, she was transferred to the ICU and received blood transfusion and was started on IV fluids and pressors. Patient overnight continued to have bleeding therefore she underwent left colectomy with an ostomy. He shouldn't is postop day 5 and is having bowel movements through her ostomy.  She has been seen by surgery and okayed for discharge. She is very weak and therefore is in need of rehabilitation. Patient does have a history of polycythemia vera and her hemoglobin has been low due to the GI bleed therefore her hydroxyurea is currently held. Once her hemoglobin starts trending up she will need to follow up with her hematologist to see if this can be resumed. Once her hemoglobin starts trending up then this will need to be resumed.     Consults  general surgery, vascular surgery  Significant Tests:  See full reports for  all details   Dg Chest 1 View  04/07/2015   CLINICAL DATA:  Shortness of breath.  EXAM: CHEST  1 VIEW  COMPARISON:  03/22/2015  FINDINGS: The heart size is moderately enlarged. Aortic atherosclerosis noted. Lung volumes are low and there is a small left effusion. Atelectasis is noted in the left base.  IMPRESSION: 1. Suspect small left pleural effusion. 2. Cardiac enlargement 3. Aortic atherosclerosis.   Electronically Signed   By: Kerby Moors M.D.   On: 04/07/2015 09:28   Nm Gi Blood Loss  04/02/2015   CLINICAL DATA:  Acute GI bleed.  EXAM: NUCLEAR MEDICINE GASTROINTESTINAL BLEEDING SCAN  TECHNIQUE: Sequential abdominal images were obtained following intravenous administration of Tc-25m labeled red blood cells.  RADIOPHARMACEUTICALS:  20 mCi Tc-73m in-vitro labeled red cells.  COMPARISON:  CT abdomen pelvis - 07/08/2014  FINDINGS: There is abnormal radiotracer accumulation overlying the expected location of the descending colon within the left mid hemi abdomen. This activity persists and peristalsis with subsequent acquired images.  Physiologic activity is seen within the heart and major abdominal vasculature.  IMPRESSION: Findings compatible with acute lower GI bleed with suspected area of bleeding within the proximal descending colon.  Critical Value/emergent results were called by telephone at the time of interpretation on 04/02/2015 at 3:05 am to Dr. Charlesetta Ivory , who verbally acknowledged these results.   Electronically Signed   By: Sandi Mariscal M.D.   On: 04/02/2015 03:07   US Venous Img Lower Unilateral Left  03/25/2015   CLINICAL DATA:  Left lower extremity pain for 1 day.  EXAM: Left LOWER EXTREMITY VENOUS  DOPPLER ULTRASOUND  TECHNIQUE: Gray-scale sonography with graded compression, as well as color Doppler and duplex ultrasound were performed to evaluate the lower extremity deep venous systems from the level of the common femoral vein and including the common femoral, femoral, profunda  femoral, popliteal and calf veins including the posterior tibial, peroneal and gastrocnemius veins when visible. The superficial great saphenous vein was also interrogated. Spectral Doppler was utilized to evaluate flow at rest and with distal augmentation maneuvers in the common femoral, femoral and popliteal veins.  COMPARISON:  None.  FINDINGS: Contralateral Common Femoral Vein: Respiratory phasicity is normal and symmetric with the symptomatic side. No evidence of thrombus. Normal compressibility.  Common Femoral Vein: No evidence of thrombus. Normal compressibility, respiratory phasicity and response to augmentation.  Saphenofemoral Junction: No evidence of thrombus. Normal compressibility and flow on color Doppler imaging.  Profunda Femoral Vein: No evidence of thrombus. Normal compressibility and flow on color Doppler imaging.  Femoral Vein: No evidence of thrombus. Normal compressibility, respiratory phasicity and response to augmentation.  Popliteal Vein: No evidence of thrombus. Normal compressibility, respiratory phasicity and response to augmentation.  Calf Veins: Posterior tibial and peroneal veins are not visualized.  Superficial Great Saphenous Vein: No evidence of thrombus. Normal compressibility and flow on color Doppler imaging.  Venous Reflux:  None.  Other Findings: Complex but predominantly hypoechoic abnormality is noted medially in the left calf which corresponds to palpable abnormality. It measures 5.1 x 2.2 x 3.6 cm and is most consistent with hematoma.  IMPRESSION: No definite evidence of deep venous thrombosis seen in left lower extremity, although calf veins are not visualized on this study. 5.1 cm complex hypoechoic abnormality is seen in the medial soft tissues of the left calf most consistent with hematoma ; but if there is no clinical evidence or history of traumatic injury, neoplasm cannot be excluded. MRI would be recommended in that setting.   Electronically Signed   By: Marijo Conception, M.D.   On: 03/25/2015 12:38   Dg Abd Acute W/chest  03/22/2015   CLINICAL DATA:  Left lower quadrant pain  EXAM: DG ABDOMEN ACUTE W/ 1V CHEST  COMPARISON:  None.  FINDINGS: The cardiac shadow is enlarged. The lungs are clear bilaterally. Degenerative changes of the shoulder joints are seen bilaterally. No free air is noted. Scattered large and small bowel gas is seen. Degenerative changes of lumbar spine are noted. Bilateral hip replacements are seen.  IMPRESSION: No acute abnormality noted.   Electronically Signed   By: Inez Catalina M.D.   On: 03/22/2015 16:53       Today   Subjective:   Yolanda Wells  feels very weak and tired no other complaints.  Objective:   Blood pressure 120/56, pulse 87, temperature 98.8 F (37.1 C), temperature source Oral, resp. rate 17, height 5\' 7"  (1.702 m), weight 105.915 kg (233 lb 8 oz), SpO2 100 %.  .  Intake/Output Summary (Last 24 hours) at 04/08/15 0925 Last data filed at 04/08/15 0719  Gross per 24 hour  Intake    240 ml  Output    500 ml  Net   -260 ml    Exam VITAL SIGNS: Blood pressure 120/56, pulse 87, temperature 98.8 F (37.1 C), temperature source Oral, resp. rate 17, height 5\' 7"  (1.702 m), weight 105.915 kg (233 lb 8 oz), SpO2 100 %.  GENERAL:  81 y.o.-year-old patient lying in the bed with no acute distress.  EYES: Pupils equal, round, reactive to light and  accommodation. No scleral icterus. Extraocular muscles intact.  HEENT: Head atraumatic, normocephalic. Oropharynx and nasopharynx clear.  NECK:  Supple, no jugular venous distention. No thyroid enlargement, no tenderness.  LUNGS: Normal breath sounds bilaterally, no wheezing, rales,rhonchi or crepitation. No use of accessory muscles of respiration.  CARDIOVASCULAR: S1, S2 normal. No murmurs, rubs, or gallops.  ABDOMEN: Soft, nontender, nondistended. Bowel sounds present. Colostomy with stool  EXTREMITIES: No pedal edema, cyanosis, or clubbing.  NEUROLOGIC: Cranial  nerves II through XII are intact. Muscle strength 5/5 in all extremities. Sensation intact. Gait not checked.  PSYCHIATRIC: The patient is alert and oriented x 3.  SKIN: No obvious rash, lesion, or ulcer.   Data Review     CBC w Diff: Lab Results  Component Value Date   WBC 9.2 04/07/2015   WBC 3.7 07/08/2014   HGB 8.3* 04/07/2015   HGB 13.8 07/08/2014   HCT 24.6* 04/07/2015   HCT 42.0 07/08/2014   PLT 319 04/07/2015   PLT 834* 07/08/2014   LYMPHOPCT 13 04/01/2015   LYMPHOPCT 21.1 07/08/2014   MONOPCT 6 04/01/2015   MONOPCT 6.1 07/08/2014   EOSPCT 1 04/01/2015   EOSPCT 2.3 07/08/2014   BASOPCT 1 04/01/2015   BASOPCT 0.8 07/08/2014   CMP: Lab Results  Component Value Date   NA 137 04/08/2015   NA 144 07/08/2014   K 3.5 04/08/2015   K 3.7 07/08/2014   CL 103 04/08/2015   CL 108* 07/08/2014   CO2 29 04/08/2015   CO2 27 07/08/2014   BUN 5* 04/08/2015   BUN 15 07/08/2014   CREATININE 0.43* 04/08/2015   CREATININE 0.64 07/08/2014   PROT 6.4* 04/01/2015   PROT 7.1 07/08/2014   ALBUMIN 3.1* 04/01/2015   ALBUMIN 2.9* 07/08/2014   BILITOT 0.7 04/01/2015   ALKPHOS 73 04/01/2015   ALKPHOS 75 07/08/2014   AST 15 04/01/2015   AST 23 07/08/2014   ALT 7* 04/01/2015   ALT 12* 07/08/2014  .  Micro Results No results found for this or any previous visit (from the past 240 hour(s)).      Code Status Orders        Start     Ordered   04/02/15 0607  Full code   Continuous     04/02/15 0606    Advance Directive Documentation        Most Recent Value   Type of Advance Directive  Healthcare Power of Attorney   Pre-existing out of facility DNR order (yellow form or pink MOST form)     "MOST" Form in Place?            Follow-up Information    Follow up with Sherri Rad, MD In 7 days.   Specialties:  Surgery, Radiology   Contact information:   [address] [place]  [address] [phone]       Follow up with PCP In 7 days.      Discharge  Medications     Medication List    STOP taking these medications        amLODipine 5 MG tablet  Commonly known as:  NORVASC     aspirin EC 81 MG tablet     hydroxyurea 500 MG capsule  Commonly known as:  HYDREA     naproxen 375 MG tablet  Commonly known as:  NAPROSYN      TAKE these medications        ondansetron 4 MG/2ML Soln injection  Commonly known as:  ZOFRAN  Inject 2 mLs (4 mg total) into the vein every 6 (six) hours as needed for nausea.     oxycodone-acetaminophen 2.5-325 MG per tablet  Commonly known as:  PERCOCET  Take 1 tablet by mouth every 4 (four) hours as needed for pain.     potassium chloride SA 20 MEQ tablet  Commonly known as:  K-DUR,KLOR-CON  Take 20 mEq by mouth daily.           Total Time in preparing paper work, data evaluation and todays exam - 35 minutes  Dustin Flock M.D on 04/08/2015 at 9:25 AM  [place]  [phone]

## 2015-04-10 ENCOUNTER — Non-Acute Institutional Stay (SKILLED_NURSING_FACILITY): Payer: Medicare Other | Admitting: Registered Nurse

## 2015-04-10 ENCOUNTER — Encounter: Payer: Self-pay | Admitting: Registered Nurse

## 2015-04-10 DIAGNOSIS — R5381 Other malaise: Secondary | ICD-10-CM | POA: Diagnosis not present

## 2015-04-10 DIAGNOSIS — K922 Gastrointestinal hemorrhage, unspecified: Secondary | ICD-10-CM

## 2015-04-10 DIAGNOSIS — Z9889 Other specified postprocedural states: Secondary | ICD-10-CM

## 2015-04-10 DIAGNOSIS — Z9049 Acquired absence of other specified parts of digestive tract: Secondary | ICD-10-CM

## 2015-04-10 DIAGNOSIS — Z862 Personal history of diseases of the blood and blood-forming organs and certain disorders involving the immune mechanism: Secondary | ICD-10-CM | POA: Diagnosis not present

## 2015-04-10 DIAGNOSIS — D62 Acute posthemorrhagic anemia: Secondary | ICD-10-CM | POA: Diagnosis not present

## 2015-04-10 LAB — CBC AND DIFFERENTIAL
HEMATOCRIT: 25 % — AB (ref 36–46)
Hemoglobin: 8.1 g/dL — AB (ref 12.0–16.0)
PLATELETS: 240 10*3/uL (ref 150–399)
WBC: 6.4 10^3/mL

## 2015-04-10 LAB — BASIC METABOLIC PANEL
BUN: 6 mg/dL (ref 4–21)
Creatinine: 0.4 mg/dL — AB (ref 0.5–1.1)
GLUCOSE: 92 mg/dL
Potassium: 3.9 mmol/L (ref 3.4–5.3)
Sodium: 137 mmol/L (ref 137–147)

## 2015-04-10 NOTE — Progress Notes (Signed)
Patient ID: Yolanda Wells, female   DOB: 22-Dec-1931, 81 y.o.   MRN: [id number]   Place of Service: [facility]  No Known Allergies  Code Status: Full Code  Goals of Care: Longevity/STR  Chief Complaint  Patient presents with  . Hospitalization Follow-up    HPI Reviewed of hospital record showed 81 y.o. female with PMH of HTN, polycythemia vera, DVT, bil hip replacement among others is being seen for a follow-up visit post hospital admission from 04/01/15 to 04/08/15 with GI bleed. She was admitted to ICU for blood transfusion and IV pressors and underwent left colectomy and colostomy. She is now for short term rehab and her goal is to return home. Seen in room today. Denies any concerns. Reports pain is adequately controlled with current regimen. No issues with constipation.   Review of Systems Constitutional: Negative for fever and chills HENT: Negative for ear pain, congestion, and sore throat Eyes: Negative for eye pain and eye discharge Cardiovascular: Negative for chest pain, palpitations, and leg swelling Respiratory: Negative cough, shortness of breath, and wheezing.  Gastrointestinal: Negative for nausea and vomiting. Negative for abdominal pain Genitourinary: Negative for  dysuria Endocrine: Negative for polydipsia, polyphagia, and polyuria Musculoskeletal: Negative for uncontrolled pain Neurological: Negative for dizziness and headache  Skin: Negative for rash Psychiatric: Negative for depression   Past Medical History  Diagnosis Date  . Hypertension   . Polycythemia vera(238.4)   . Deep venous thrombosis     right lower extremity  . History of hysterectomy   . History of bilateral hip replacements     Past Surgical History  Procedure Laterality Date  . Toe amputation      right  . Joint replacement      Left and Right Hip  . Abdominal hysterectomy    . Peripheral vascular catheterization N/A 04/02/2015    Procedure: Visceral Angiography;  Surgeon:  Algernon Huxley, MD;  Location: [facility];  Service: Cardiovascular;  Laterality: N/A;  . Peripheral vascular catheterization N/A 04/02/2015    Procedure: Visceral Artery Intervention;  Surgeon: Algernon Huxley, MD;  Location: [facility];  Service: Cardiovascular;  Laterality: N/A;  . Colectomy with colostomy creation/hartmann procedure N/A 04/03/2015    Procedure: COLECTOMY WITH COLOSTOMY CREATION/HARTMANN PROCEDURE;  Surgeon: Sherri Rad, MD;  Location: [facility];  Service: General;  Laterality: N/A;  . Cardiac catheterization Right 04/03/2015    Procedure: CENTRAL LINE INSERTION;  Surgeon: Sherri Rad, MD;  Location: [facility];  Service: General;  Laterality: Right;    History  Substance Use Topics  . Smoking status: Never Smoker   . Smokeless tobacco: Not on file  . Alcohol Use: No    Family History  Problem Relation Age of Onset  . Heart attack Father   . Hypertension    . Arthritis-Osteo        Medication List       This list is accurate as of: 04/10/15  2:38 PM.  Always use your most recent med list.               ondansetron 4 MG/2ML Soln injection  Commonly known as:  ZOFRAN  Inject 2 mLs (4 mg total) into the vein every 6 (six) hours as needed for nausea.     oxycodone-acetaminophen 2.5-325 MG per tablet  Commonly known as:  PERCOCET  Take 1 tablet by mouth every 4 (four) hours as needed for pain.     potassium chloride SA 20  MEQ tablet  Commonly known as:  K-DUR,KLOR-CON  Take 20 mEq by mouth daily.        Physical Exam  BP 146/87 mmHg  Pulse 78  Temp(Src) 98.3 F (36.8 C)  Resp 16  Ht 5\' 5"  (1.651 m)  Wt 228 lb (103.42 kg)  BMI 37.94 kg/m2  SpO2 94%  Constitutional: Obese but frail elderly female in no acute distress. Conversant and pleasant HEENT: Normocephalic and atraumatic. PERRL. EOM intact. No icterus. Oral mucosa moist. Posterior pharynx clear of any exudate or lesions.   Neck: Supple and nontender. No lymphadenopathy, masses, or  thyromegaly. No JVD or carotid bruits. Cardiac: Normal S1, S2. RRR without appreciable murmurs, rubs, or gallops. Distal pulses intact. 1+ pitting edema of BLE Respiratory: Unlabored respiration. Breath sounds clear bilaterally without rales, rhonchi, or wheezes. GI: Audible bowel sounds in all quadrants. Soft and nondistended. Tender to palpation Musculoskeletal: able to move all extremities. Generalized weakness. S/p right transmetatarsal amputation Skin: Warm and dry. Bruising noted on BUE.   Neurological: Arouseable. Oriented to self, time, and situation Psychiatric: Judgment and insight adequate. Appropriate mood and affect.   Labs Reviewed  CBC Latest Ref Rng 04/10/2015 04/07/2015 04/06/2015  WBC - 6.4 9.2 12.5(H)  Hemoglobin 12.0 - 16.0 g/dL 8.1(A) 8.3(L) 8.3(L)  Hematocrit 36 - 46 % 25(A) 24.6(L) 25.6(L)  Platelets 150 - 399 K/L 240 319 418    CMP Latest Ref Rng 04/10/2015 04/08/2015 04/07/2015  Glucose 65 - 99 mg/dL - 104(H) 122(H)  BUN 4 - 21 mg/dL 6 5(L) 7  Creatinine 0.5 - 1.1 mg/dL 0.4(A) 0.43(L) 0.48  Sodium 137 - 147 mmol/L 137 137 139  Potassium 3.4 - 5.3 mmol/L 3.9 3.5 3.3(L)  Chloride 101 - 111 mmol/L - 103 106  CO2 22 - 32 mmol/L - 29 28  Calcium 8.9 - 10.3 mg/dL - 7.4(L) 7.4(L)  Total Protein 6.5 - 8.1 g/dL - - -  Total Bilirubin 0.3 - 1.2 mg/dL - - -  Alkaline Phos 38 - 126 U/L - - -  AST 15 - 41 U/L - - -  ALT 14 - 54 U/L - - -    Diagnostic Studies Reviewed 04/07/15: CXR The heart size is moderately enlarged. Aortic atherosclerosis noted. Lung volumes are low and there is a small left effusion. Atelectasis is noted in the left base.  04/07/15: NM GI blood loss Findings compatible with acute lower GI bleed with suspected area of bleeding within the proximal descending colon.  03/22/14: EKG: NSR with RBBB   Assessment & Plan 1. Acute lower GI bleed S/p left colectomy and colostomy. Recent hgb 8.1. Continue to monitor h&h  2. Physical  deconditioning Continue to work with PT/OT for gait/strength/balance training to restore/maximize function. Fall risk precautions  3. S/P colectomy Pain is adequately controlled with current regimen. Denies any n/v. Continue percocet 2.5/325mg  every 4 hours as needed for pain. Continue to monitor her status  4. S/P colonoscopy See #3. Continue ostomy care.   5. Hx of polycythemia vera Hydroxyurea was held during hospitalization r/t low hgb due to GI bleed. Will have patient follow up with hematologist to see if this can be resumed.   6. Acute blood loss anemia R/t to lower GI bleed. Last hgb 8.1. Continue to monitor h&h.    Diagnostic Studies/Labs Ordered: cbc, bmp in week  Time: 40 minutes w/ >50% of total time spent on care coordination   Family/Staff Communication Plan of care discussed with patient and nursing staff. Patient  and nursing staff verbalized understanding and agree with plan of care. No additional questions or concerns reported.    Arthur Holms, MSN, AGNP-C [facility] [address] [place],  [address] [phone] [8am-5pm] After hours: [phone]

## 2015-04-12 ENCOUNTER — Encounter: Payer: Self-pay | Admitting: Internal Medicine

## 2015-04-12 ENCOUNTER — Non-Acute Institutional Stay (SKILLED_NURSING_FACILITY): Payer: Medicare Other | Admitting: Internal Medicine

## 2015-04-12 DIAGNOSIS — D62 Acute posthemorrhagic anemia: Secondary | ICD-10-CM

## 2015-04-12 DIAGNOSIS — D45 Polycythemia vera: Secondary | ICD-10-CM | POA: Diagnosis not present

## 2015-04-12 DIAGNOSIS — A499 Bacterial infection, unspecified: Secondary | ICD-10-CM

## 2015-04-12 DIAGNOSIS — K922 Gastrointestinal hemorrhage, unspecified: Secondary | ICD-10-CM | POA: Diagnosis not present

## 2015-04-12 DIAGNOSIS — Z9049 Acquired absence of other specified parts of digestive tract: Secondary | ICD-10-CM

## 2015-04-12 DIAGNOSIS — L089 Local infection of the skin and subcutaneous tissue, unspecified: Secondary | ICD-10-CM

## 2015-04-12 DIAGNOSIS — B9689 Other specified bacterial agents as the cause of diseases classified elsewhere: Secondary | ICD-10-CM

## 2015-04-12 DIAGNOSIS — Z9889 Other specified postprocedural states: Secondary | ICD-10-CM | POA: Diagnosis not present

## 2015-04-12 DIAGNOSIS — R5381 Other malaise: Secondary | ICD-10-CM

## 2015-04-12 NOTE — Progress Notes (Signed)
Patient ID: Yolanda Wells, female   DOB: 09-May-1932, 81 y.o.   MRN: [id number]    [facility]  PCP: No primary care provider on file.  Code Status: Full Code  No Known Allergies  Chief Complaint  Patient presents with  . New Admit To SNF    New Admission      HPI:  81 year old patient is here for short term rehabilitation post hospital admission from  04/01/15 - 04/08/15 with GI bleed. She had catheter placement to splenic flexure branch of inferior mesenteric artery and micro bead embolization to the splenic flexure branch but continued to bleed. She required blood transfusion, pressor support and left colectomy with colostomy. She is seen in her room today. She denies any concerns. As per staff, they have noticed her incision site to be red and with some pustules.  She has PMH of HTN, polycythemia vera, DVT   Review of Systems:  Constitutional: Negative for fever, chills and diaphoresis. positive for fatigue HENT: Negative for headache, congestion Eyes: Negative for eye pain, blurred vision, double vision and discharge.  Respiratory: Negative for cough, shortness of breath and wheezing.   Cardiovascular: Negative for chest pain, palpitations, leg swelling.  Gastrointestinal: Negative for heartburn, nausea, vomiting, abdominal pain. Appetite is good Genitourinary: Negative for dysuria Musculoskeletal: Negative for back pain, falls Skin: Negative for itching Neurological: Negative for dizziness, tingling, focal weakness Psychiatric/Behavioral: Negative for depression   Past Medical History  Diagnosis Date  . Hypertension   . Polycythemia vera(238.4)   . Deep venous thrombosis     right lower extremity  . History of hysterectomy   . History of bilateral hip replacements    Past Surgical History  Procedure Laterality Date  . Toe amputation      right  . Joint replacement      Left and Right Hip  . Abdominal hysterectomy    . Peripheral vascular  catheterization N/A 04/02/2015    Procedure: Visceral Angiography;  Surgeon: Algernon Huxley, MD;  Location: [place] [facility];  Service: Cardiovascular;  Laterality: N/A;  . Peripheral vascular catheterization N/A 04/02/2015    Procedure: Visceral Artery Intervention;  Surgeon: Algernon Huxley, MD;  Location: [place] [facility];  Service: Cardiovascular;  Laterality: N/A;  . Colectomy with colostomy creation/hartmann procedure N/A 04/03/2015    Procedure: COLECTOMY WITH COLOSTOMY CREATION/HARTMANN PROCEDURE;  Surgeon: Sherri Rad, MD;  Location: [facility];  Service: General;  Laterality: N/A;  . Cardiac catheterization Right 04/03/2015    Procedure: CENTRAL LINE INSERTION;  Surgeon: Sherri Rad, MD;  Location: [facility];  Service: General;  Laterality: Right;   Social History:   reports that she has never smoked. She does not have any smokeless tobacco history on file. She reports that she does not drink alcohol or use illicit drugs.  Family History  Problem Relation Age of Onset  . Heart attack Father   . Hypertension    . Arthritis-Osteo      Medications: Patient's Medications  New Prescriptions   No medications on file  Previous Medications   ONDANSETRON (ZOFRAN) 4 MG/2ML SOLN INJECTION    Inject 2 mLs (4 mg total) into the vein every 6 (six) hours as needed for nausea.   OXYCODONE-ACETAMINOPHEN (PERCOCET) 2.5-325 MG PER TABLET    Take 1 tablet by mouth every 4 (four) hours as needed for pain.   POTASSIUM CHLORIDE SA (K-DUR,KLOR-CON) 20 MEQ TABLET    Take 20 mEq by mouth daily.  Modified Medications   No medications on file  Discontinued Medications   No medications on file     Physical Exam: Filed Vitals:   04/12/15 0843  BP: 125/72  Pulse: 78  Temp: 97.6 F (36.4 C)  TempSrc: Oral  SpO2: 99%    General- elderly female, obese, frail, in no acute distress Head- normocephalic, atraumatic Throat- moist mucus membrane Eyes- no pallor, no icterus, no discharge, normal conjunctiva,  normal sclera Neck- no cervical lymphadenopathy Cardiovascular- normal s1,s2, no murmurs, palpable dorsalis pedis and radial pulses, no leg edema Respiratory- bilateral clear to auscultation, no wheeze, no rhonchi, no crackles, no use of accessory muscles Abdomen- bowel sounds present, soft, non tender Musculoskeletal- able to move all 4 extremities, generalized weakness, right metatarsal amputation Neurological- no focal deficit, alert and oriented Skin- warm and dry, abdominal surgical incision with staples, few pustules and few open skin area with serous drainage, erythema and warmth + Psychiatry- alert and oriented to person and place, normal mood and affect    Labs reviewed: Basic Metabolic Panel:  Recent Labs  04/03/15 0636 04/04/15 0415 04/05/15 1033 04/07/15 0506 04/08/15 0518 04/10/15  NA 140 139 138 139 137 137  K 3.8 3.9 3.5 3.3* 3.5 3.9  CL 114* 115* 110 106 103  --   CO2 23 23 22 28 29   --   GLUCOSE 128* 171* 119* 122* 104*  --   BUN 15 14 11 7  5* 6  CREATININE 0.56 0.49 0.49 0.48 0.43* 0.4*  CALCIUM 7.3* 7.3* 7.4* 7.4* 7.4*  --   MG 1.6* 2.0  --   --   --   --   PHOS 3.4 2.1*  --   --   --   --    Liver Function Tests:  Recent Labs  07/08/14 0936 03/22/15 1600 04/01/15 2230  AST 23 18 15   ALT 12* 7* 7*  ALKPHOS 75 77 73  BILITOT  --  0.8 0.7  PROT 7.1 7.3 6.4*  ALBUMIN 2.9* 3.5 3.1*    Recent Labs  03/22/15 1600  LIPASE 22   No results for input(s): AMMONIA in the last 8760 hours. CBC:  Recent Labs  07/08/14 0936  03/22/15 1600 04/01/15 2230  04/05/15 1033 04/06/15 0420 04/07/15 0506 04/10/15  WBC 3.7  < > 6.1 4.8  < > 13.5* 12.5* 9.2 6.4  NEUTROABS 2.6  --  4.9 3.8  --   --   --   --   --   HGB 13.8  < > 14.3 12.9  < > 8.5* 8.3* 8.3* 8.1*  HCT 42.0  < > 45.7 39.5  < > 25.2* 25.6* 24.6* 25*  MCV 104*  < > 106.5* 103.3*  < > 91.5 92.4 91.6  --   PLT 834*  < > 1041* 571*  < > 470* 418 319 240  < > = values in this interval not  displayed. Cardiac Enzymes:  Recent Labs  03/22/15 1600  TROPONINI <0.03   BNP: Invalid input(s): POCBNP CBG:  Recent Labs  04/02/15 1539 04/03/15 0736 04/03/15 1622  GLUCAP 102* 130* 137*    Radiological Exams: 04/07/15: CXR The heart size is moderately enlarged. Aortic atherosclerosis noted. Lung volumes are low and there is a small left effusion. Atelectasis is noted in the left base.  04/07/15: NM GI blood loss Findings compatible with acute lower GI bleed with suspected area of bleeding within the proximal descending colon.    Assessment/Plan  Physical deconditioning Will have  her work with physical therapy and occupational therapy team to help with gait training and muscle strengthening exercises.fall precautions. Skin care. Encourage to be out of bed.   Acute lower GI bleed S/p left colectomy and colostomy. Continue abdominal wall incision skin care. No active gi bleed at present. Monitor h&h  Acute blood loss anemia From gi bleed. Monitor h&h  Skin infection Skin infection at incision site. Start her on doxycycline 100 mg bid x 1 week with florastor 250 mg bid x 2 weeks. Monitor clinically. Continue daily dressing change  S/P colectomy Pain is adequately controlled with current regimen. Has stool in colostomy bag. No nausea or vomiting and tolerating po intake well. Continue percocet 2.5/325mg  every 4 hours as needed for pain. Continue colostomy site care  polycythemia vera Pending heme-onc follow up for resuming of hydroxyurea  Goals of care: short term rehabilitation   Labs/tests ordered: cbc with diff, bmp next lab  Family/ staff Communication: reviewed care plan with patient and nursing supervisor    Blanchie Serve, MD  [place] [phone] (Monday-Friday 8 am - 5 pm) [phone] (afterhours)

## 2015-04-13 ENCOUNTER — Emergency Department
Admission: EM | Admit: 2015-04-13 | Discharge: 2015-04-13 | Disposition: A | Discharge status: Home / Self Care | Payer: Medicare Other | Attending: Emergency Medicine | Admitting: Emergency Medicine

## 2015-04-13 DIAGNOSIS — N939 Abnormal uterine and vaginal bleeding, unspecified: Secondary | ICD-10-CM | POA: Diagnosis present

## 2015-04-13 DIAGNOSIS — Z791 Long term (current) use of non-steroidal anti-inflammatories (NSAID): Secondary | ICD-10-CM | POA: Diagnosis not present

## 2015-04-13 DIAGNOSIS — K922 Gastrointestinal hemorrhage, unspecified: Secondary | ICD-10-CM | POA: Insufficient documentation

## 2015-04-13 DIAGNOSIS — I10 Essential (primary) hypertension: Secondary | ICD-10-CM | POA: Insufficient documentation

## 2015-04-13 DIAGNOSIS — Z79899 Other long term (current) drug therapy: Secondary | ICD-10-CM | POA: Insufficient documentation

## 2015-04-13 LAB — BASIC METABOLIC PANEL
ANION GAP: 6 (ref 5–15)
BUN: 7 mg/dL (ref 6–20)
CALCIUM: 7.6 mg/dL — AB (ref 8.9–10.3)
CHLORIDE: 103 mmol/L (ref 101–111)
CO2: 29 mmol/L (ref 22–32)
CREATININE: 0.51 mg/dL (ref 0.44–1.00)
GLUCOSE: 103 mg/dL — AB (ref 65–99)
Potassium: 3.8 mmol/L (ref 3.5–5.1)
Sodium: 138 mmol/L (ref 135–145)

## 2015-04-13 LAB — CBC
HCT: 27.9 % — ABNORMAL LOW (ref 35.0–47.0)
Hemoglobin: 8.7 g/dL — ABNORMAL LOW (ref 12.0–16.0)
MCH: 28 pg (ref 26.0–34.0)
MCHC: 31.3 g/dL — AB (ref 32.0–36.0)
MCV: 89.4 fL (ref 80.0–100.0)
Platelets: 237 10*3/uL (ref 150–440)
RBC: 3.13 MIL/uL — ABNORMAL LOW (ref 3.80–5.20)
RDW: 26.6 % — ABNORMAL HIGH (ref 11.5–14.5)
WBC: 6.9 10*3/uL (ref 3.6–11.0)

## 2015-04-13 NOTE — Discharge Instructions (Signed)
Please seek medical attention for any high fevers, chest pain, shortness of breath, change in behavior, persistent vomiting, bloody stool or any other new or concerning symptoms. Colostomy Surgery A colostomy surgery is a procedure that redirects a section of the large intestine (colon) to an opening in the abdomen. This opening is called a stoma or ostomy. A bag is attached to the stoma on the outside of the body. This bag collects waste, since the waste can no longer travel through the rest of the colon. Where the stoma is located and what it looks like depends on the type of colostomy performed. A colostomy may be temporary or permanent. The hospital stay after this procedure is typically 3 to 7 days.  LET YOUR CAREGIVER KNOW ABOUT:  Allergies to food or medicine.  Medicines taken, including vitamins, health supplements, herbs, eyedrops, over-the-counter medicines, and creams.  Use of steroids (by mouth or creams).  Previous problems with anesthetics or numbing medicines.  History of bleeding problems or blood clots.  Previous surgery.  Other health problems, including diabetes and kidney problems.  Possibility of pregnancy, if this applies. RISKS AND COMPLICATIONS General surgical complications may include:  Reaction to anesthesia.  Damage to surrounding nerves, tissues, or structures.  Infection.  Blood clot.  Bleeding.  Scarring.  Pain that lasts longer than 3 months. Specific risks for colostomy, while rare, may include:  Intestinal blockage.  Skin irritation.  Wound opening.  Narrowing or collapsing of the stoma.  Hernia. BEFORE THE PROCEDURE It is important to follow your surgeon's instructions prior to your procedure to avoid complications. Steps before your procedure may include:  A physical exam, blood and urine tests, stool test, X-rays, and other procedures.  Chemotherapy or radiation therapy.  A review of the procedure, the anesthesia being used,  and what to expect after the procedure. You may meet with an ostomy advisor. You may be asked to:  Stop taking certain medicines for several days prior to your procedure such as blood thinners (including aspirin).  Take certain medicines, such as antibiotics or stool softeners.  Follow a special diet for several days prior to the procedure and to avoid eating and drinking after midnight the night before the procedure. This will help you to avoid complications from the anesthesia.  Take an antibacterial shower the night before, or the morning of, the procedure.  Quit smoking. Smoking increases the chances of an infection or a healing problem after your procedure. Arrange for someone to drive you home after surgery. You should also arrange to have someone help you with activities while you recover. PROCEDURE There are several types of colostomy procedures. The 2 main procedure types are loop colostomy or end colostomy. During a loop colostomy, the surgeon pulls 2 ends of the intestine out toward the abdomen, using 2 openings. During an end colostomy, the surgeon pulls 1 end of the intestine out toward the abdomen, through 1 opening. You will be given medicine that makes you sleep (general anesthetic).The procedure may be done as open surgery, with a large cut (incision), or as laparoscopic surgery, with several small incisions.  AFTER THE PROCEDURE  You will be given pain medicine.  You may be able to suck on ice. You may begin drinking clear fluids the next day and begin a normal diet after 2 days, or as directed by your surgeon.  Your stoma will be covered with bandages or a pouch.  Initial drainage from the stoma will be liquid.  The stoma may  be dark-colored, swollen, and bruised until it has more time to heal. Document Released: 03/19/2011 Document Revised: 01/19/2012 Document Reviewed: 03/19/2011 [facility] Patient Information 2015 [place], [place]. This information is not intended to  replace advice given to you by your health care provider. Make sure you discuss any questions you have with your health care provider.

## 2015-04-13 NOTE — ED Provider Notes (Signed)
[facility] Emergency Department Provider Note    ____________________________________________  Time seen: 1425  I have reviewed the triage vital signs and the nursing notes.   HISTORY  Chief Complaint Vaginal Bleeding   History limited by: Not Limited   HPI Yolanda Wells is a 81 y.o. female who presents for her living facility today because of concerns for blood found in the underwear. There was some concern that this might be vaginal. Patient was recently admitted to the hospital for GI bleed and underwent a colostomy. Appears to have been doing well since discharge. Patient denies any abdominal pain. Patient denies any fevers. As felt a little weak but has not had any chest pain or shortness of breath.  Past Medical History  Diagnosis Date  . Hypertension   . Polycythemia vera(238.4)   . Deep venous thrombosis     right lower extremity  . History of hysterectomy   . History of bilateral hip replacements     Patient Active Problem List   Diagnosis Date Noted  . GI bleed 04/02/2015    Past Surgical History  Procedure Laterality Date  . Toe amputation      right  . Joint replacement      Left and Right Hip  . Abdominal hysterectomy    . Peripheral vascular catheterization N/A 04/02/2015    Procedure: Visceral Angiography;  Surgeon: Algernon Huxley, MD;  Location: [place] [facility];  Service: Cardiovascular;  Laterality: N/A;  . Peripheral vascular catheterization N/A 04/02/2015    Procedure: Visceral Artery Intervention;  Surgeon: Algernon Huxley, MD;  Location: [place] [facility];  Service: Cardiovascular;  Laterality: N/A;  . Colectomy with colostomy creation/hartmann procedure N/A 04/03/2015    Procedure: COLECTOMY WITH COLOSTOMY CREATION/HARTMANN PROCEDURE;  Surgeon: Sherri Rad, MD;  Location: [facility];  Service: General;  Laterality: N/A;  . Cardiac catheterization Right 04/03/2015    Procedure: CENTRAL LINE INSERTION;  Surgeon: Sherri Rad, MD;   Location: [facility];  Service: General;  Laterality: Right;    Current Outpatient Rx  Name  Route  Sig  Dispense  Refill  . amLODipine (NORVASC) 5 MG tablet   Oral   Take 5 mg by mouth daily.         . hydroxyurea (HYDREA) 500 MG capsule   Oral   Take 1,000 mg by mouth daily. May take with food to minimize GI side effects.         . naproxen (NAPROSYN) 375 MG tablet   Oral   Take 375 mg by mouth daily.         [name] oxycodone-acetaminophen (PERCOCET) 2.5-325 MG per tablet   Oral   Take 1 tablet by mouth every 4 (four) hours as needed for pain.   30 tablet   0   . potassium chloride SA (K-DUR,KLOR-CON) 20 MEQ tablet   Oral   Take 20 mEq by mouth daily.         . ondansetron (ZOFRAN) 4 MG/2ML SOLN injection   Intravenous   Inject 2 mLs (4 mg total) into the vein every 6 (six) hours as needed for nausea. Patient not taking: Reported on 04/13/2015   2 mL   0     Allergies Review of patient's allergies indicates no known allergies.  Family History  Problem Relation Age of Onset  . Heart attack Father   . Hypertension    . Arthritis-Osteo      Social History History  Substance Use Topics  .  Smoking status: Never Smoker   . Smokeless tobacco: Not on file  . Alcohol Use: No    Review of Systems  Constitutional: Negative for fever. Cardiovascular: Negative for chest pain. Respiratory: Negative for shortness of breath. Gastrointestinal: Negative for abdominal pain, vomiting and diarrhea. Genitourinary: Negative for dysuria. Musculoskeletal: Negative for back pain. Skin: Negative for rash. Neurological: Negative for headaches, focal weakness or numbness.   10-point ROS otherwise negative.  ____________________________________________   PHYSICAL EXAM:  VITAL SIGNS: ED Triage Vitals  Enc Vitals Group     BP 04/13/15 1251 108/61 mmHg     Pulse Rate 04/13/15 1251 90     Resp 04/13/15 1251 16     Temp 04/13/15 1251 98.6 F (37 C)     Temp Source  04/13/15 1251 Oral     SpO2 04/13/15 1251 96 %     Weight 04/13/15 1251 220 lb (99.791 kg)     Height 04/13/15 1251 5\' 5"  (1.651 m)   Constitutional: Alert and oriented. Well appearing and in no distress. Eyes: Conjunctivae are normal. PERRL. Normal extraocular movements. ENT   Head: Normocephalic and atraumatic.   Nose: No congestion/rhinnorhea.   Mouth/Throat: Mucous membranes are moist.   Neck: No stridor. Hematological/Lymphatic/Immunilogical: No cervical lymphadenopathy. Cardiovascular: Normal rate, regular rhythm.  No murmurs, rubs, or gallops. Respiratory: Normal respiratory effort without tachypnea nor retractions. Breath sounds are clear and equal bilaterally. No wheezes/rales/rhonchi. Gastrointestinal: Soft and nontender. No distention. There is no CVA tenderness. Genitourinary: no blood noted in vaginal vault, small amount of blood around the anus. Musculoskeletal: Normal range of motion in all extremities. No joint effusions.  No lower extremity tenderness nor edema. Neurologic:  Normal speech and language. No gross focal neurologic deficits are appreciated. Speech is normal.  Skin:  Skin is warm, dry and intact. No rash noted. Psychiatric: Mood and affect are normal. Speech and behavior are normal. Patient exhibits appropriate insight and judgment.  ____________________________________________    LABS (pertinent positives/negatives)  Labs Reviewed  CBC - Abnormal; Notable for the following:    RBC 3.13 (*)    Hemoglobin 8.7 (*)    HCT 27.9 (*)    MCHC 31.3 (*)    RDW 26.6 (*)    All other components within normal limits  BASIC METABOLIC PANEL - Abnormal; Notable for the following:    Glucose, Bld 103 (*)    Calcium 7.6 (*)    All other components within normal limits     ____________________________________________   EKG  None  ____________________________________________     RADIOLOGY  None  ____________________________________________   PROCEDURES  Procedure(s) performed: None  Critical Care performed: No  ____________________________________________   INITIAL IMPRESSION / ASSESSMENT AND PLAN / ED COURSE  Pertinent labs & imaging results that were available during my care of the patient were reviewed by me and considered in my medical decision making (see chart for details).  Patient presents from nursing facility because of concerns for bleeding found in the underwear. Patient underwent colectomy and ostomy roughly 10 days ago. Mom amount of blood noted around the anus. Hemoglobin stable. Vital signs without any concerning findings. Discussed with surgery who stated that this amount of bleeding can be normal after the procedure. Discussed this with patient he was comfortable with plan to go home. States she has an appointment scheduled with surgery early next week.   ____________________________________________   FINAL CLINICAL IMPRESSION(S) / ED DIAGNOSES  Final diagnoses:  Gastrointestinal hemorrhage, unspecified gastritis, unspecified gastrointestinal hemorrhage type  Nance Pear, MD 04/13/15 [phone]

## 2015-04-13 NOTE — ED Notes (Signed)
Pt arrived via EMS from [facility] reporting an episode of vaginal bleeding. EMS reports not seeing blood due to staff having cleaned pt. Pt denies pain. Reports weakness x 7 days. Pt placed at [facility] due to colostomy placement last week at [facility].

## 2015-04-16 ENCOUNTER — Non-Acute Institutional Stay (SKILLED_NURSING_FACILITY): Payer: Medicare Other | Admitting: Nurse Practitioner

## 2015-04-16 DIAGNOSIS — L089 Local infection of the skin and subcutaneous tissue, unspecified: Secondary | ICD-10-CM

## 2015-04-16 DIAGNOSIS — A499 Bacterial infection, unspecified: Secondary | ICD-10-CM

## 2015-04-16 DIAGNOSIS — Z9889 Other specified postprocedural states: Secondary | ICD-10-CM

## 2015-04-16 DIAGNOSIS — B9689 Other specified bacterial agents as the cause of diseases classified elsewhere: Secondary | ICD-10-CM

## 2015-04-16 DIAGNOSIS — Z9049 Acquired absence of other specified parts of digestive tract: Secondary | ICD-10-CM

## 2015-04-16 NOTE — Progress Notes (Signed)
Patient ID: Yolanda Wells, female   DOB: 1932-02-23, 81 y.o.   MRN: [id number]    Nursing Home Location:  [place] of Service: SNF (31)  PCP: No primary care provider on file.  No Known Allergies  Chief Complaint  Patient presents with  . Acute Visit    HPI:  Patient is a 81 y.o. female seen today at [facility] at nursing request due to fever and evaluation after ED visit. Pt with pmh of HTN, polycythemia vera, DVT who has been admitted to [facility] place after hospitalization for GI bleed. Pt was seen in the ED on 04/13/15 from what facility thought was vaginal bleeding. ED noted it to be normal bleeding from rectum after colectomy and ostomy. Bleeding had improved in the ED and no reports of recurrence. Pt with temp of 101.2 last pm, tylenol given. Pt was seen on 04/12/15 by Dr Bubba Camp due to redness to surgical site. Doxycyline 100 mg BID ordered. Pt denies pain to abdomen. No fevers since last pm   Review of Systems:  Review of Systems  Constitutional: Positive for fever, activity change and fatigue. Negative for appetite change and unexpected weight change.  HENT: Negative for congestion and hearing loss.   Eyes: Negative.   Respiratory: Negative for cough and shortness of breath.   Cardiovascular: Negative for chest pain, palpitations and leg swelling.  Gastrointestinal: Negative for abdominal pain, diarrhea and constipation.  Genitourinary: Negative for dysuria and difficulty urinating.  Musculoskeletal: Negative for myalgias and arthralgias.  Skin: Positive for wound. Negative for color change.  Neurological: Negative for dizziness and weakness.  Psychiatric/Behavioral: Negative for behavioral problems, confusion and agitation.    Past Medical History  Diagnosis Date  . Hypertension   . Polycythemia vera(238.4)   . Deep venous thrombosis     right lower extremity  . History of hysterectomy   . History of bilateral hip  replacements    Past Surgical History  Procedure Laterality Date  . Toe amputation      right  . Joint replacement      Left and Right Hip  . Abdominal hysterectomy    . Peripheral vascular catheterization N/A 04/02/2015    Procedure: Visceral Angiography;  Surgeon: Algernon Huxley, MD;  Location: [place] [facility];  Service: Cardiovascular;  Laterality: N/A;  . Peripheral vascular catheterization N/A 04/02/2015    Procedure: Visceral Artery Intervention;  Surgeon: Algernon Huxley, MD;  Location: [place] [facility];  Service: Cardiovascular;  Laterality: N/A;  . Colectomy with colostomy creation/hartmann procedure N/A 04/03/2015    Procedure: COLECTOMY WITH COLOSTOMY CREATION/HARTMANN PROCEDURE;  Surgeon: Sherri Rad, MD;  Location: [facility];  Service: General;  Laterality: N/A;  . Cardiac catheterization Right 04/03/2015    Procedure: CENTRAL LINE INSERTION;  Surgeon: Sherri Rad, MD;  Location: [facility];  Service: General;  Laterality: Right;   Social History:   reports that she has never smoked. She does not have any smokeless tobacco history on file. She reports that she does not drink alcohol or use illicit drugs.  Family History  Problem Relation Age of Onset  . Heart attack Father   . Hypertension    . Arthritis-Osteo      Medications: Patient's Medications  New Prescriptions   No medications on file  Previous Medications   AMLODIPINE (NORVASC) 5 MG TABLET    Take 5 mg by mouth daily.   HYDROXYUREA (HYDREA) 500 MG CAPSULE  Take 1,000 mg by mouth daily. May take with food to minimize GI side effects.   NAPROXEN (NAPROSYN) 375 MG TABLET    Take 375 mg by mouth daily.   ONDANSETRON (ZOFRAN) 4 MG/2ML SOLN INJECTION    Inject 2 mLs (4 mg total) into the vein every 6 (six) hours as needed for nausea.   OXYCODONE-ACETAMINOPHEN (PERCOCET) 2.5-325 MG PER TABLET    Take 1 tablet by mouth every 4 (four) hours as needed for pain.   POTASSIUM CHLORIDE SA (K-DUR,KLOR-CON) 20 MEQ TABLET    Take  20 mEq by mouth daily.  Modified Medications   No medications on file  Discontinued Medications   No medications on file     Physical Exam: Filed Vitals:   04/16/15 1705  BP: 134/85  Pulse: 94  Temp: 98.5 F (36.9 C)  Resp: 20    Physical Exam  Constitutional: She is oriented to person, place, and time. She appears well-developed and well-nourished. No distress.  HENT:  Head: Normocephalic and atraumatic.  Mouth/Throat: Oropharynx is clear and moist. No oropharyngeal exudate.  Eyes: Conjunctivae are normal. Pupils are equal, round, and reactive to light.  Neck: Normal range of motion. Neck supple.  Cardiovascular: Normal rate, regular rhythm and normal heart sounds.   Pulmonary/Chest: Effort normal and breath sounds normal.  Abdominal: Soft. Bowel sounds are normal.  abdominal surgical incision with staples, few open skin area with purulent drainage, erythema and warmth   colostomy site intake  Musculoskeletal: She exhibits no edema or tenderness.  Neurological: She is alert and oriented to person, place, and time.  Skin: She is not diaphoretic.  Psychiatric: She has a normal mood and affect.    Labs reviewed: Basic Metabolic Panel:  Recent Labs  04/03/15 0636 04/04/15 0415  04/07/15 0506 04/08/15 0518 04/10/15 04/13/15 1419  NA 140 139  < > 139 137 137 138  K 3.8 3.9  < > 3.3* 3.5 3.9 3.8  CL 114* 115*  < > 106 103  --  103  CO2 23 23  < > 28 29  --  29  GLUCOSE 128* 171*  < > 122* 104*  --  103*  BUN 15 14  < > 7 5* 6 7  CREATININE 0.56 0.49  < > 0.48 0.43* 0.4* 0.51  CALCIUM 7.3* 7.3*  < > 7.4* 7.4*  --  7.6*  MG 1.6* 2.0  --   --   --   --   --   PHOS 3.4 2.1*  --   --   --   --   --   < > = values in this interval not displayed. Liver Function Tests:  Recent Labs  07/08/14 0936 03/22/15 1600 04/01/15 2230  AST 23 18 15   ALT 12* 7* 7*  ALKPHOS 75 77 73  BILITOT  --  0.8 0.7  PROT 7.1 7.3 6.4*  ALBUMIN 2.9* 3.5 3.1*    Recent Labs   03/22/15 1600  LIPASE 22   No results for input(s): AMMONIA in the last 8760 hours. CBC:  Recent Labs  07/08/14 0936  03/22/15 1600 04/01/15 2230  04/06/15 0420 04/07/15 0506 04/10/15 04/13/15 1419  WBC 3.7  < > 6.1 4.8  < > 12.5* 9.2 6.4 6.9  NEUTROABS 2.6  --  4.9 3.8  --   --   --   --   --   HGB 13.8  < > 14.3 12.9  < > 8.3* 8.3* 8.1* 8.7*  HCT 42.0  < > 45.7 39.5  < > 25.6* 24.6* 25* 27.9*  MCV 104*  < > 106.5* 103.3*  < > 92.4 91.6  --  89.4  PLT 834*  < > 1041* 571*  < > 418 319 240 237  < > = values in this interval not displayed. TSH: No results for input(s): TSH in the last 8760 hours. A1C: No results found for: HGBA1C Lipid Panel: No results for input(s): CHOL, HDL, LDLCALC, TRIG, CHOLHDL, LDLDIRECT in the last 8760 hours.   Assessment/Plan 1. Skin infection, bacterial Pt seen 4 days ago due to redness to site of abdominal wound, doxycycline added. Now with pur lent drainage and pt with temps. After consulting with Dr Bubba Camp will dc doxycycline and start keflex 500 mg PO q 6 hours for 7 days, clindamycin 450 mg PO QID for 7 days To cont florastore -dressing changes daily Treatment nurse notified to closely monitor  -surgery follow up has been scheduled -follow up cbc in AM VS q shift   2. S/P colectomy -sent to ED after bleeding from rectum, reveiwed hospital record, H&H stable and bleeding minimal at this time, will follow up cbc in am

## 2015-04-19 ENCOUNTER — Other Ambulatory Visit: Payer: Self-pay

## 2015-04-19 ENCOUNTER — Emergency Department
Admission: EM | Admit: 2015-04-19 | Discharge: 2015-04-19 | Disposition: A | Discharge status: Home / Self Care | Payer: Medicare Other | Attending: Emergency Medicine | Admitting: Emergency Medicine

## 2015-04-19 ENCOUNTER — Non-Acute Institutional Stay (SKILLED_NURSING_FACILITY): Payer: Medicare Other | Admitting: Internal Medicine

## 2015-04-19 ENCOUNTER — Emergency Department: Payer: Medicare Other

## 2015-04-19 DIAGNOSIS — Z933 Colostomy status: Secondary | ICD-10-CM | POA: Insufficient documentation

## 2015-04-19 DIAGNOSIS — A419 Sepsis, unspecified organism: Secondary | ICD-10-CM

## 2015-04-19 DIAGNOSIS — L03311 Cellulitis of abdominal wall: Secondary | ICD-10-CM | POA: Insufficient documentation

## 2015-04-19 DIAGNOSIS — J181 Lobar pneumonia, unspecified organism: Secondary | ICD-10-CM

## 2015-04-19 DIAGNOSIS — Y838 Other surgical procedures as the cause of abnormal reaction of the patient, or of later complication, without mention of misadventure at the time of the procedure: Secondary | ICD-10-CM | POA: Insufficient documentation

## 2015-04-19 DIAGNOSIS — J189 Pneumonia, unspecified organism: Secondary | ICD-10-CM | POA: Insufficient documentation

## 2015-04-19 DIAGNOSIS — I10 Essential (primary) hypertension: Secondary | ICD-10-CM | POA: Diagnosis not present

## 2015-04-19 DIAGNOSIS — R6510 Systemic inflammatory response syndrome (SIRS) of non-infectious origin without acute organ dysfunction: Secondary | ICD-10-CM

## 2015-04-19 DIAGNOSIS — T814XXA Infection following a procedure, initial encounter: Secondary | ICD-10-CM

## 2015-04-19 DIAGNOSIS — IMO0001 Reserved for inherently not codable concepts without codable children: Secondary | ICD-10-CM

## 2015-04-19 DIAGNOSIS — T8131XA Disruption of external operation (surgical) wound, not elsewhere classified, initial encounter: Secondary | ICD-10-CM | POA: Insufficient documentation

## 2015-04-19 DIAGNOSIS — R509 Fever, unspecified: Secondary | ICD-10-CM | POA: Diagnosis present

## 2015-04-19 LAB — URINALYSIS COMPLETE WITH MICROSCOPIC (ARMC ONLY)
BACTERIA UA: NONE SEEN
BILIRUBIN URINE: NEGATIVE
Glucose, UA: NEGATIVE mg/dL
Hgb urine dipstick: NEGATIVE
Ketones, ur: NEGATIVE mg/dL
Leukocytes, UA: NEGATIVE
Nitrite: NEGATIVE
Protein, ur: NEGATIVE mg/dL
Specific Gravity, Urine: 1.023 (ref 1.005–1.030)
pH: 6 (ref 5.0–8.0)

## 2015-04-19 LAB — CBC WITH DIFFERENTIAL/PLATELET
Basophils Absolute: 0 10*3/uL (ref 0–0.1)
Basophils Relative: 0 %
EOS ABS: 0 10*3/uL (ref 0–0.7)
EOS PCT: 1 %
HCT: 26.4 % — ABNORMAL LOW (ref 35.0–47.0)
HEMOGLOBIN: 8.2 g/dL — AB (ref 12.0–16.0)
LYMPHS ABS: 0.6 10*3/uL — AB (ref 1.0–3.6)
Lymphocytes Relative: 12 %
MCH: 27.2 pg (ref 26.0–34.0)
MCHC: 31.1 g/dL — ABNORMAL LOW (ref 32.0–36.0)
MCV: 87.5 fL (ref 80.0–100.0)
MONO ABS: 0.2 10*3/uL (ref 0.2–0.9)
MONOS PCT: 4 %
NEUTROS ABS: 4.6 10*3/uL (ref 1.4–6.5)
Neutrophils Relative %: 83 %
Platelets: 206 10*3/uL (ref 150–440)
RBC: 3.02 MIL/uL — ABNORMAL LOW (ref 3.80–5.20)
RDW: 26.9 % — ABNORMAL HIGH (ref 11.5–14.5)
WBC: 5.5 10*3/uL (ref 3.6–11.0)

## 2015-04-19 LAB — COMPREHENSIVE METABOLIC PANEL
ALK PHOS: 73 U/L (ref 38–126)
ALT: 7 U/L — AB (ref 14–54)
AST: 16 U/L (ref 15–41)
Albumin: 1.9 g/dL — ABNORMAL LOW (ref 3.5–5.0)
Anion gap: 6 (ref 5–15)
BILIRUBIN TOTAL: 0.4 mg/dL (ref 0.3–1.2)
BUN: 8 mg/dL (ref 6–20)
CALCIUM: 7.6 mg/dL — AB (ref 8.9–10.3)
CO2: 23 mmol/L (ref 22–32)
Chloride: 106 mmol/L (ref 101–111)
Creatinine, Ser: 0.63 mg/dL (ref 0.44–1.00)
GFR calc Af Amer: >60 mL/min (ref >60)
GFR calc non Af Amer: >60 mL/min (ref >60)
Glucose, Bld: 132 mg/dL — ABNORMAL HIGH (ref 65–99)
POTASSIUM: 4 mmol/L (ref 3.5–5.1)
Sodium: 135 mmol/L (ref 135–145)
TOTAL PROTEIN: 5.3 g/dL — AB (ref 6.5–8.1)

## 2015-04-19 LAB — TROPONIN I: Troponin I: <0.03 ng/mL (ref <0.031)

## 2015-04-19 LAB — LACTIC ACID, PLASMA: Lactic Acid, Venous: 1.7 mmol/L (ref 0.5–2.0)

## 2015-04-19 MED ORDER — LEVOFLOXACIN 750 MG PO TABS
ORAL_TABLET | ORAL | Status: AC
  Filled 2015-04-19: qty 1

## 2015-04-19 MED ORDER — PIPERACILLIN-TAZOBACTAM 3.375 G IVPB
INTRAVENOUS | Status: AC
  Filled 2015-04-19: qty 50

## 2015-04-19 MED ORDER — PIPERACILLIN-TAZOBACTAM 3.375 G IVPB 30 MIN
3.3750 g | Freq: Once | INTRAVENOUS | Status: AC
  Administered 2015-04-19: 3.375 g via INTRAVENOUS

## 2015-04-19 MED ORDER — LEVOFLOXACIN 750 MG PO TABS
750.0000 mg | ORAL_TABLET | Freq: Once | ORAL | Status: AC
  Administered 2015-04-19: 750 mg via ORAL

## 2015-04-19 MED ORDER — VANCOMYCIN HCL IN DEXTROSE 1-5 GM/200ML-% IV SOLN: 1000.0000 mg | Freq: Once | INTRAVENOUS | Status: DC

## 2015-04-19 MED ORDER — VANCOMYCIN HCL IN DEXTROSE 1-5 GM/200ML-% IV SOLN
INTRAVENOUS | Status: AC
  Filled 2015-04-19: qty 200

## 2015-04-19 MED ORDER — LEVOFLOXACIN 750 MG PO TABS: 750.0000 mg | ORAL_TABLET | Freq: Every day | ORAL | Status: DC

## 2015-04-19 NOTE — ED Provider Notes (Signed)
[facility]  I accepted care from Dr. Jimmye Norman ____________________________________________    LABS (pertinent positives/negatives)  Urinalysis negative Blood cultures pending Lactate 1.7 White blood cell count 5.5 Hemoglobin 8.2 Complete metabolic panel without significant abnormality Troponin less than 0.03   ____________________________________________    RADIOLOGY  Chest x-ray portable: IMPRESSION: Ill-defined opacity at the right base, concerning for developing infection given the history.  Followup PA and lateral chest X-ray is recommended in 3-4 weeks following trial of antibiotic therapy to ensure resolution and exclude underlying malignancy. ____________________________________________   PROCEDURES  Procedure(s) performed: None  Critical Care performed: None  ____________________________________________   INITIAL IMPRESSION / ASSESSMENT AND PLAN / ED COURSE  Pertinent labs & imaging results that were available during my care of the patient were reviewed by me and considered in my medical decision making (see chart for details).  I except the care from Dr. Jimmye Norman. He was suspicious of wound infection source of the patient's reported fever although she is afebrile here with stable vital signs. Labs were pending. The patient was not complaining of any additional symptoms to suggest a specific cause.  At 4 PM I reviewed her labs which are reassuring. There are no signs of sepsis, with a normal white blood cell count, no fever here, normal lactate, normal metabolic panel.   I reviewed her recent clinical course with small amount of wound dehisced since and concern about incisional wound infection. She had been on doxycycline for a day or so, and then changed to Keflex and clindamycin 3 days ago by her physician and nurse practitioner at the nursing home for incisional infection. The patient has no abdominal pain and no incisional skin  erythema induration or abscess. I discussed with Dr. Leanora Cover partner of Dr. Marina Gravel who did the surgery, who recommended no antibiotics for the small drainage from the wound dehiscence area and just dry dressing changes twice a day without any Tegaderm or occlusive dressing. Staples were left in place per Dr. Leanora Cover to be addressed and possibly removed in the office in follow-up.  The patient's workup revealed a right lower lobe pneumonia for which she will be placed on the anabolic Levaquin.  I did discontinue the IV vancomycin and so Zosyn given the fact that the patient is stable well appearing, with reassuring labs, and able to take by mouth and able to go back to the nursing facility today.  Patient and son are comfortable with this plan. Patient will need to go back by ambulance for transport back to the nursing home.  I discussed with them discontinue the Keflex and clindamycin, and start the by mouth Levaquin.  ____________________________________________   FINAL CLINICAL IMPRESSION(S) / ED DIAGNOSES  Acute  right lower lobe pneumonia    Lisa Roca, MD 04/19/15 1627

## 2015-04-19 NOTE — Discharge Instructions (Signed)
Stop Keflex. Stop clindamycin. Start Levaquin 750 mg by mouth once daily for 7 more days.  Dressing changes: Wet gauze to dehisced incisional wound,  with dry nonadherent gauze and paper tape over the incision changed twice daily. No Tegaderm.  Per consultation with the surgeon, stop Keflex and clindamycin and use dressing changes to manage the wound dehiscence. For x-ray finding of right lower lobe pneumonia, start Levaquin antibiotic.  Return to the emergency department for any new or worsening abdominal pain, redness or skin rash or abscess around the incision or skin. Return to emergency department for any coughing, trouble breathing, shortness breath, chest pain, vomiting, altered mental status, low blood pressure or fever after 24 hours on appropriate antibiotics.

## 2015-04-19 NOTE — ED Provider Notes (Signed)
[facility] Emergency Department Provider Note     Time seen: ----------------------------------------- 2:34 PM on 04/19/2015 -----------------------------------------    I have reviewed the triage vital signs and the nursing notes.  L5 caveat: Review of systems and history is difficult to obtain due to memory disturbance HISTORY  Chief Complaint Fever    HPI Yolanda Wells is a 81 y.o. female brought to the ER from [facility] for fever. Per reports she recently had colostomy surgery. Reportedly she's also take antibiotics and she has had a fever today, she also recently been treated for infection with antibiotics. Denies any other complaints.   Past Medical History  Diagnosis Date  . Hypertension   . Polycythemia vera(238.4)   . Deep venous thrombosis     right lower extremity  . History of hysterectomy   . History of bilateral hip replacements     Patient Active Problem List   Diagnosis Date Noted  . GI bleed 04/02/2015    Past Surgical History  Procedure Laterality Date  . Toe amputation      right  . Joint replacement      Left and Right Hip  . Abdominal hysterectomy    . Peripheral vascular catheterization N/A 04/02/2015    Procedure: Visceral Angiography;  Surgeon: Algernon Huxley, MD;  Location: [place] [facility];  Service: Cardiovascular;  Laterality: N/A;  . Peripheral vascular catheterization N/A 04/02/2015    Procedure: Visceral Artery Intervention;  Surgeon: Algernon Huxley, MD;  Location: [place] [facility];  Service: Cardiovascular;  Laterality: N/A;  . Colectomy with colostomy creation/hartmann procedure N/A 04/03/2015    Procedure: COLECTOMY WITH COLOSTOMY CREATION/HARTMANN PROCEDURE;  Surgeon: Sherri Rad, MD;  Location: [facility];  Service: General;  Laterality: N/A;  . Cardiac catheterization Right 04/03/2015    Procedure: CENTRAL LINE INSERTION;  Surgeon: Sherri Rad, MD;  Location: [facility];  Service: General;  Laterality: Right;     Allergies Review of patient's allergies indicates no known allergies.  Social History History  Substance Use Topics  . Smoking status: Never Smoker   . Smokeless tobacco: Not on file  . Alcohol Use: No    Review of Systems Constitutional: Positive for fever Eyes: Negative for visual changes. ENT: Negative for sore throat. Cardiovascular: Negative for chest pain. Respiratory: Negative for shortness of breath. Gastrointestinal: Negative for abdominal pain, vomiting and diarrhea. Genitourinary: Negative for dysuria. Musculoskeletal: Negative for back pain. Skin: Negative for rash. Neurological: Negative for headaches, focal weakness or numbness.  10-point ROS otherwise negative.  ____________________________________________   PHYSICAL EXAM:  VITAL SIGNS: ED Triage Vitals  Enc Vitals Group     BP --      Pulse --      Resp --      Temp --      Temp src --      SpO2 04/19/15 1427 98 %     Weight --      Height --      Head Cir --      Peak Flow --      Pain Score --      Pain Loc --      Pain Edu? --      Excl. in [place]? --     Constitutional: Alert and oriented. Well appearing and in no distress. Eyes: Conjunctivae are normal. PERRL. Normal extraocular movements. ENT   Head: Normocephalic and atraumatic.   Nose: No congestion/rhinnorhea.   Mouth/Throat: Mucous membranes are moist.  Neck: No stridor. Hematological/Lymphatic/Immunilogical: No cervical lymphadenopathy. Cardiovascular: Normal rate, regular rhythm. Normal and symmetric distal pulses are present in all extremities. No murmurs, rubs, or gallops. Respiratory: Normal respiratory effort without tachypnea nor retractions. Breath sounds are clear and equal bilaterally. No wheezes/rales/rhonchi. Gastrointestinal: Laparotomy site with some surrounding erythematous. Be some purulent drainage from the wound. Colostomy appears unremarkable. Musculoskeletal: Nontender with normal range of motion  in all extremities. No joint effusions.  No lower extremity tenderness nor edema. Neurologic:  Normal speech and language. No gross focal neurologic deficits are appreciated. Speech is normal. No gait instability. Skin:  Laparotomy wound with surrounding cellulitis as documented above Psychiatric: Mood and affect are normal. Speech and behavior are normal. Patient exhibits appropriate insight and judgment.  EKG: Interpreted by me, normal sinus rhythm with right bundle branch block, left anterior fascicular block. Rate is 86. No evidence of acute infarction ____________________________________________  ED COURSE:  Pertinent labs & imaging results that were available during my care of the patient were reviewed by me and considered in my medical decision making (see chart for details). We'll check basic labs, give IV antibiotics reevaluate. ____________________________________________    LABS (pertinent positives/negatives)  Labs Reviewed  CBC WITH DIFFERENTIAL/PLATELET - Abnormal; Notable for the following:    RBC 3.02 (*)    Hemoglobin 8.2 (*)    HCT 26.4 (*)    MCHC 31.1 (*)    RDW 26.9 (*)    Lymphs Abs 0.6 (*)    All other components within normal limits  COMPREHENSIVE METABOLIC PANEL - Abnormal; Notable for the following:    Glucose, Bld 132 (*)    Calcium 7.6 (*)    Total Protein 5.3 (*)    Albumin 1.9 (*)    ALT 7 (*)    All other components within normal limits  URINALYSIS COMPLETEWITH MICROSCOPIC ([facility] ONLY) - Abnormal; Notable for the following:    Color, Urine YELLOW (*)    APPearance CLEAR (*)    Squamous Epithelial / LPF 0-5 (*)    All other components within normal limits  URINE CULTURE  CULTURE, BLOOD (ROUTINE X 2)  CULTURE, BLOOD (ROUTINE X 2)  TROPONIN I  LACTIC ACID, PLASMA    RADIOLOGY  None  ____________________________________________  FINAL ASSESSMENT AND PLAN  Cellulitis and wound infection  Plan: Patient be checked out to Dr. Lerry Paterson  for final disposition.   Earleen Newport, MD   Earleen Newport, MD 04/21/15 0700

## 2015-04-19 NOTE — Progress Notes (Signed)
Patient ID: Yolanda Wells, female   DOB: [birth year], 81 y.o.   MRN: [id number]     [facility]  PCP: No primary care provider on file.  Code Status: Full Code  No Known Allergies  Chief Complaint  Patient presents with  . Acute Visit    confusion, worsening wound with drainage     HPI:  81 year old patient is here for short term rehabilitation post left colectomy with colostomy. She is seen in her room today. She has refused to work with therapy and mentions she is not feeling well. She has discomfort in her abdomen area. As per wound nurse, her surgical incision site appears worse. the erythema has worsened and she has ongoing drainage of straw color with foul smell. Her colostomy site has also eroded and there is leaking of stool. Staff have noticed a new gaping of wound around one of the staple site.  Patient is seen in her room with her son, therapy team, wound care nurse and nursing supervisor present. She is alert and oriented to person. When concerns raised about confusion, she mentions not being confused but just getting aggrevated by not being able to get better.  She has PMH of HTN, polycythemia vera, DVT   Review of Systems:  Constitutional: has temperature of 101. Complaints of chills and fatigue HENT: Negative for headache, congestion Eyes: Negative for eye pain, blurred vision, double vision and discharge.  Respiratory: Negative for cough, shortness of breath and wheezing.   Cardiovascular: Negative for chest pain, palpitations, leg swelling.  Gastrointestinal: Negative for heartburn, nausea, vomiting. has abdominal pain. Appetite is fair Genitourinary: Negative for dysuria Musculoskeletal: Negative for back pain, falls Skin: Negative for itching Neurological: Negative for dizziness, tingling, focal weakness Psychiatric/Behavioral: Negative for depression   Past Medical History  Diagnosis Date  . Hypertension   . Polycythemia vera(238.4)   .  Deep venous thrombosis     right lower extremity  . History of hysterectomy   . History of bilateral hip replacements    Past Surgical History  Procedure Laterality Date  . Toe amputation      right  . Joint replacement      Left and Right Hip  . Abdominal hysterectomy    . Peripheral vascular catheterization N/A 04/02/2015    Procedure: Visceral Angiography;  Surgeon: Algernon Huxley, MD;  Location: [place] [facility];  Service: Cardiovascular;  Laterality: N/A;  . Peripheral vascular catheterization N/A 04/02/2015    Procedure: Visceral Artery Intervention;  Surgeon: Algernon Huxley, MD;  Location: [place] [facility];  Service: Cardiovascular;  Laterality: N/A;  . Colectomy with colostomy creation/hartmann procedure N/A 04/03/2015    Procedure: COLECTOMY WITH COLOSTOMY CREATION/HARTMANN PROCEDURE;  Surgeon: Sherri Rad, MD;  Location: [facility];  Service: General;  Laterality: N/A;  . Cardiac catheterization Right 04/03/2015    Procedure: CENTRAL LINE INSERTION;  Surgeon: Sherri Rad, MD;  Location: [facility];  Service: General;  Laterality: Right;    Medication reviewed. See MAR  Physical Exam: Filed Vitals:   04/19/15 1624  BP: 132/97  Pulse: 91  Temp: 101.9 F (38.8 C)  Resp: 22  SpO2: 98%    General- elderly female, obese, frail Head- normocephalic, atraumatic Throat- moist mucus membrane Eyes- no pallor, no icterus, no discharge, normal conjunctiva, normal sclera Neck- no cervical lymphadenopathy Cardiovascular- normal s1,s2, no murmurs, palpable dorsalis pedis and radial pulses, no leg edema Respiratory- bilateral clear to auscultation, no wheeze, no rhonchi, no  crackles Abdomen- bowel sounds present, soft, mild generalized tenderness Musculoskeletal- able to move all 4 extremities, generalized weakness, right metatarsal amputation Neurological- no focal deficit, alert but oriented only to person Skin- warm and dry, abdominal surgical incision with staples with erythema around  the incision, 1 cm opening on right side of the incision with light yellow foul smelling drainage, colostomy area with stool stain and eroded underneath skin area (of note dressing was newly changed this am and at noon it is already soaked) Psychiatry- feels irritated   Labs- pending labs from this am   Assessment/Plan  SIRS high grade temperature, change of mental status, tachycardia HR >90/min, tachypnea RR > 20/min and incisional site infection, concern for SIRS. Had normal wbc on labs yesterday.  worsening erythema around incision site despite of broadening of antibiotics is concerning. I have concern for a possible fistula formation from ostomy site to the abdominal wall incision area with her recent complicated surgery and stool contamination of the drainage. Patient requests to be sent out. Will send pt out for further evaluation and treatment of SIRS/ early sepsis   Incisional skin infection On keflex and clindamycin at present, have ED evaluate for need of having her on vancomycin and zosyn   Family/ staff Communication: reviewed care plan with patient, charge nurse and nursing supervisor  Spent more than 45 minutes in patient care- 50% of the total time spent in care co-ordination  Blanchie Serve, MD  [place] (Monday-Friday 8 am - 5 pm) [phone] (afterhours)

## 2015-04-19 NOTE — ED Notes (Signed)
Pt presents to ED via EMS from [facility].  Pt had colostomy bag placed 3 wks ago.  Per EMS pt has had infection for 2 wks.  Per EMS per facility pts fever began today and was 25 F.  Per EMS, pts temp was 98.F.  Per EMS pt received Tylenol but unable to say how much/how long ago.

## 2015-04-21 LAB — URINE CULTURE: CULTURE: NO GROWTH

## 2015-04-24 LAB — CULTURE, BLOOD (ROUTINE X 2)
CULTURE: NO GROWTH
CULTURE: NO GROWTH

## 2015-05-01 ENCOUNTER — Ambulatory Visit (INDEPENDENT_AMBULATORY_CARE_PROVIDER_SITE_OTHER): Payer: Medicare Other | Admitting: Surgery

## 2015-05-01 ENCOUNTER — Encounter: Payer: Self-pay | Admitting: Surgery

## 2015-05-01 VITALS — BP 112/75 | HR 96 | Temp 99.0°F | Ht 67.0 in | Wt 200.0 lb

## 2015-05-01 DIAGNOSIS — K5731 Diverticulosis of large intestine without perforation or abscess with bleeding: Secondary | ICD-10-CM

## 2015-05-01 NOTE — Patient Instructions (Signed)
Please Follow-up in our [facility] with Dr. Burt Knack on 05/15/15 at 11:30am.  Please let nursing staff know if there is any problems, questions or, concerns to please call our office [phone] and ask to speak with one of our nurses.

## 2015-05-01 NOTE — Progress Notes (Signed)
Subjective:     Patient ID: Yolanda Wells, female   DOB: [birth year], 81 y.o.   MRN: [id number]  HPI 81 year old frail female presented in May of this year with a massive lower GI bleed status post failed embolization requiring emergent colectomy with colostomy. She had a prolonged hospital stay. She is currently in rehabilitation. Her family is not satisfied with the care of the rehabilitation facility.  There has been minor wound issues.  Review of Systems  Unable to perform ROS      Objective:   Physical Exam  Constitutional: She appears well-developed and well-nourished. No distress.  HENT:  Head: Normocephalic.  Eyes: Pupils are equal, round, and reactive to light.  Cardiovascular: Normal rate.   Pulmonary/Chest: Effort normal and breath sounds normal. No respiratory distress. She has no wheezes.  Abdominal:  Staples were removed. At the midportion of the midline wound is a 2 cm x 2 cm x 2 cm deep opening with healthy granulation tissue. There is a healthy left lower quadrant colostomy.  Skin: Skin is warm and dry.       Assessment:     She is making slow progress. Minor wound issue.    Plan:     Aquacel daily to her wound. Follow-up in our office 10-14 days and as needed. If she recovers well enough for reversal we will consider this. I do not think that she is medically fit at this point for any further surgical procedures.

## 2015-05-15 ENCOUNTER — Encounter: Payer: Self-pay | Admitting: Surgery

## 2015-05-15 ENCOUNTER — Ambulatory Visit (INDEPENDENT_AMBULATORY_CARE_PROVIDER_SITE_OTHER): Payer: Medicare Other | Admitting: Surgery

## 2015-05-15 VITALS — BP 130/88 | HR 101 | Temp 98.8°F | Resp 18 | Ht 67.0 in | Wt 210.0 lb

## 2015-05-15 DIAGNOSIS — K529 Noninfective gastroenteritis and colitis, unspecified: Secondary | ICD-10-CM

## 2015-05-15 NOTE — Progress Notes (Signed)
Outpatient postop visit  05/15/2015  Yolanda Wells is an 81 y.o. female.    Procedure: Colon resection  CC: No problems  HPI: Patient regaining strength slowly not using any sort of dressing on her abdomen at this point.  Medications reviewed.    Physical Exam:  BP 130/88 mmHg  Pulse 101  Temp(Src) 98.8 F (37.1 C) (Oral)  Resp 18  Ht 5\' 7"  (1.702 m)  Wt 210 lb (95.255 kg)  BMI 32.88 kg/m2    PE: We can wheelchair bound. Abdomen soft and nontender. Left-sided colostomy present. No erythema no drainage from midline scar    Assessment/Plan:  Patient doing very well. Family wants to talk about colostomy closure. I suggested that she see Dr. Marina Wells in 6 weeks or so and continue to improve nutritional status to decrease the risk of complication and short recovery period from her next operation.  Yolanda Glen, MD, FACS

## 2015-05-18 ENCOUNTER — Emergency Department: Payer: Medicare Other

## 2015-05-18 ENCOUNTER — Other Ambulatory Visit: Payer: Self-pay

## 2015-05-18 ENCOUNTER — Emergency Department
Admission: EM | Admit: 2015-05-18 | Discharge: 2015-05-19 | Disposition: A | Discharge status: Home / Self Care | Payer: Medicare Other | Attending: Emergency Medicine | Admitting: Emergency Medicine

## 2015-05-18 DIAGNOSIS — Z79899 Other long term (current) drug therapy: Secondary | ICD-10-CM | POA: Diagnosis not present

## 2015-05-18 DIAGNOSIS — R2242 Localized swelling, mass and lump, left lower limb: Secondary | ICD-10-CM | POA: Diagnosis present

## 2015-05-18 DIAGNOSIS — I82403 Acute embolism and thrombosis of unspecified deep veins of lower extremity, bilateral: Secondary | ICD-10-CM

## 2015-05-18 DIAGNOSIS — I10 Essential (primary) hypertension: Secondary | ICD-10-CM | POA: Diagnosis not present

## 2015-05-18 DIAGNOSIS — Z791 Long term (current) use of non-steroidal anti-inflammatories (NSAID): Secondary | ICD-10-CM | POA: Insufficient documentation

## 2015-05-18 DIAGNOSIS — M7989 Other specified soft tissue disorders: Secondary | ICD-10-CM

## 2015-05-18 LAB — PROTIME-INR
INR: 1.37
PROTHROMBIN TIME: 17.1 s — AB (ref 11.4–15.0)

## 2015-05-18 LAB — CBC
HCT: 31.5 % — ABNORMAL LOW (ref 35.0–47.0)
HEMOGLOBIN: 10.3 g/dL — AB (ref 12.0–16.0)
MCH: 29.1 pg (ref 26.0–34.0)
MCHC: 32.8 g/dL (ref 32.0–36.0)
MCV: 88.9 fL (ref 80.0–100.0)
Platelets: 412 10*3/uL (ref 150–440)
RBC: 3.55 MIL/uL — AB (ref 3.80–5.20)
RDW: 24.5 % — ABNORMAL HIGH (ref 11.5–14.5)
WBC: 9.2 10*3/uL (ref 3.6–11.0)

## 2015-05-18 LAB — BASIC METABOLIC PANEL
Anion gap: 8 (ref 5–15)
BUN: 9 mg/dL (ref 6–20)
CO2: 24 mmol/L (ref 22–32)
Calcium: 8 mg/dL — ABNORMAL LOW (ref 8.9–10.3)
Chloride: 100 mmol/L — ABNORMAL LOW (ref 101–111)
Creatinine, Ser: 0.43 mg/dL — ABNORMAL LOW (ref 0.44–1.00)
GFR calc non Af Amer: >60 mL/min (ref >60)
GLUCOSE: 96 mg/dL (ref 65–99)
POTASSIUM: 4.8 mmol/L (ref 3.5–5.1)
Sodium: 132 mmol/L — ABNORMAL LOW (ref 135–145)

## 2015-05-18 LAB — APTT: aPTT: 39 seconds — ABNORMAL HIGH (ref 24–36)

## 2015-05-18 MED ORDER — RIVAROXABAN (XARELTO) VTE STARTER PACK (15 & 20 MG): ORAL_TABLET | ORAL | Status: DC

## 2015-05-18 MED ORDER — RIVAROXABAN 15 MG PO TABS
15.0000 mg | ORAL_TABLET | Freq: Once | ORAL | Status: AC
  Administered 2015-05-18: 15 mg via ORAL
  Filled 2015-05-18: qty 1

## 2015-05-18 NOTE — ED Provider Notes (Addendum)
Patient with extensive DVT bilaterally but no evidence of phlegmasia cerulea dolens. 2+ distal pulses, no cyanosis, soft compartments bilaterally. We will start Xarelto. Discussed case with Dr. Jannifer Franklin of internal medicine who agrees discharge is appropriate . Patient will need close follow up with PCP  Lavonia Drafts, MD 05/18/15 2162  Lavonia Drafts, MD 05/18/15 2221

## 2015-05-18 NOTE — ED Provider Notes (Signed)
[facility] Emergency Department Provider Note  ____________________________________________  Time seen: Approximately 8:50 PM  I have reviewed the triage vital signs and the nursing notes.   HISTORY  Chief Complaint DVT    HPI Yolanda Wells is a 81 y.o. female should've previous DVTs, deconditioning, colectomy due to gastrointestinal bleeding at the end of May by Dr. Felton Clinton. Patient presents today with concerns of a new DVT in the left lower leg.  The patient denies any acute complaints or pain. She denies ever having a history of any sort of bleeding stroke, no recent dark or black stools. No recent bleeding that she is aware of, though she did have a fairly large amount of bleeding at the end of May and surgery for this.  Her left lower leg has been swollen she states for quite some time, but today evidently an ultrasound was reported that shows a DVT.   Past Medical History  Diagnosis Date  . Hypertension   . Polycythemia vera(238.4)   . Deep venous thrombosis     right lower extremity  . History of hysterectomy   . History of bilateral hip replacements     Patient Active Problem List   Diagnosis Date Noted  . Diverticulosis of colon with hemorrhage 04/02/2015    Past Surgical History  Procedure Laterality Date  . Toe amputation      right  . Joint replacement      Left and Right Hip  . Abdominal hysterectomy    . Peripheral vascular catheterization N/A 04/02/2015    Procedure: Visceral Angiography;  Surgeon: Algernon Huxley, MD;  Location: [place] [facility];  Service: Cardiovascular;  Laterality: N/A;  . Peripheral vascular catheterization N/A 04/02/2015    Procedure: Visceral Artery Intervention;  Surgeon: Algernon Huxley, MD;  Location: [place] [facility];  Service: Cardiovascular;  Laterality: N/A;  . Colectomy with colostomy creation/hartmann procedure N/A 04/03/2015    Procedure: COLECTOMY WITH COLOSTOMY CREATION/HARTMANN PROCEDURE;   Surgeon: Sherri Rad, MD;  Location: [facility];  Service: General;  Laterality: N/A;  . Cardiac catheterization Right 04/03/2015    Procedure: CENTRAL LINE INSERTION;  Surgeon: Sherri Rad, MD;  Location: [facility];  Service: General;  Laterality: Right;    Current Outpatient Rx  Name  Route  Sig  Dispense  Refill  . amLODipine (NORVASC) 5 MG tablet   Oral   Take 5 mg by mouth daily.         . hydroxyurea (HYDREA) 500 MG capsule   Oral   Take 1,000 mg by mouth daily. May take with food to minimize GI side effects.         [name] levofloxacin (LEVAQUIN) 750 MG tablet   Oral   Take 1 tablet (750 mg total) by mouth daily.   7 tablet   0   . naproxen (NAPROSYN) 375 MG tablet   Oral   Take 375 mg by mouth daily.         . ondansetron (ZOFRAN) 4 MG/2ML SOLN injection   Intravenous   Inject 2 mLs (4 mg total) into the vein every 6 (six) hours as needed for nausea. Patient not taking: Reported on 04/13/2015   2 mL   0   . oxycodone-acetaminophen (PERCOCET) 2.5-325 MG per tablet   Oral   Take 1 tablet by mouth every 4 (four) hours as needed for pain.   30 tablet   0   . potassium chloride SA (K-DUR,KLOR-CON) 20 MEQ tablet  Oral   Take 20 mEq by mouth daily.         . Rivaroxaban (XARELTO STARTER PACK) 15 & 20 MG TBPK      Take as directed on package: Start with one 15mg  tablet by mouth twice a day with food. On Day 22, switch to one 20mg  tablet once a day with food.   51 each   0     Allergies Review of patient's allergies indicates no known allergies.  Family History  Problem Relation Age of Onset  . Heart attack Father   . Hypertension    . Arthritis-Osteo      Social History History  Substance Use Topics  . Smoking status: Never Smoker   . Smokeless tobacco: Not on file  . Alcohol Use: No    Review of Systems Constitutional: No fever/chills Eyes: No visual changes. ENT: No sore throat. Cardiovascular: Denies chest pain. Respiratory: Denies shortness of  breath. Gastrointestinal: No abdominal pain.  No nausea, no vomiting.  No diarrhea.  No constipation. No black stools. No rectal bleeding. No blood or black stools from her ostomy. Genitourinary: Negative for dysuria. Musculoskeletal: Negative for back pain. Skin: Negative for rash. Neurological: Negative for headaches, focal weakness or numbness.  10-point ROS otherwise negative.  ____________________________________________   PHYSICAL EXAM:  VITAL SIGNS: ED Triage Vitals  Enc Vitals Group     BP 05/18/15 1751 131/90 mmHg     Pulse Rate 05/18/15 1757 100     Resp 05/18/15 1754 16     Temp 05/18/15 1754 98.5 F (36.9 C)     Temp Source 05/18/15 1754 Oral     SpO2 05/18/15 1751 98 %     Weight 05/18/15 1754 200 lb (90.719 kg)     Height 05/18/15 1754 5\' 7"  (1.702 m)     Head Cir --      Peak Flow --      Pain Score 05/18/15 1757 0     Pain Loc --      Pain Edu? --      Excl. in [place]? --     Constitutional: Alert and oriented. Generally deconditioned appearance and in no distress. Eyes: Conjunctivae are normal. PERRL. EOMI. Head: Atraumatic. Nose: No congestion/rhinnorhea. Mouth/Throat: Mucous membranes are moist.  Oropharynx non-erythematous. Neck: No stridor.   Cardiovascular: Normal rate, regular rhythm. Grossly normal heart sounds.  Good peripheral circulation. Respiratory: Normal respiratory effort.  No retractions. Lungs CTAB. Gastrointestinal: Soft and nontender. No distention. No abdominal bruits. No CVA tenderness. Ostomy in place with brown stool. Stool is tested and is found to be Hemoccult-negative. Musculoskeletal:Right lower extremity with right partial foot" which is old. No appreciable swelling in the right lower extremity. Left lower extremity is notably swollen, proximal third larger than the right. There is 1+ edema in the left lower extremity and evidence of some mild venous congestion. Good strong palpable arterial pulses in dorsalis pedis on the left. No  joint effusions. Neurologic:  Patient is well oriented.  Speech is normal. She is very weak with approximate 3 out of 5 strength of the lower extremities bilaterally which is chronic. Good use of the upper extremities. No gait instability. Skin:  Skin is warm, dry and intact. No rash noted. Psychiatric: Mood and affect are normal. Speech and behavior are normal.  ____________________________________________   LABS (all labs ordered are listed, but only abnormal results are displayed)  Labs Reviewed  CBC - Abnormal; Notable for the following:    RBC  3.55 (*)    Hemoglobin 10.3 (*)    HCT 31.5 (*)    RDW 24.5 (*)    All other components within normal limits  BASIC METABOLIC PANEL - Abnormal; Notable for the following:    Sodium 132 (*)    Chloride 100 (*)    Creatinine, Ser 0.43 (*)    Calcium 8.0 (*)    All other components within normal limits  PROTIME-INR  APTT   ____________________________________________  EKG  ED ECG REPORT I, QUALE, MARK, the attending physician, personally viewed and interpreted this ECG.  Date: 05/18/2015 EKG Time: 1810 Rate: 95 Rhythm: normal sinus rhythm QRS Axis: normal Intervals: normal ST/T Wave abnormalities: normal Conduction Disutrbances: Right bundle-branch block Narrative Interpretation: Normal sinus rhythm with right bundle branch block,  ____________________________________________  RADIOLOGY  Lower extremity Doppler pending ____________________________________________   PROCEDURES  Procedure(s) performed: None  Critical Care performed: No  ____________________________________________   INITIAL IMPRESSION / ASSESSMENT AND PLAN / ED COURSE  Pertinent labs & imaging results that were available during my care of the patient were reviewed by me and considered in my medical decision making (see chart for details).  Patient presents with left lower leg swelling and concerns of a possible DVT from her nursing facility,  though specific documentation including report was not sent. We will obtain repeat Doppler to evaluate for DVTs in the bilateral lower extremities. At the present time, the patient does have a previous history of recent GI bleed however he hemoglobin has improved, she has no evidence of active bleeding and denies any symptoms consistent with active bleeding. She has no previous history of intracranial hemorrhage or other major complicating history other than her previous GI bleed. I did discuss with Dr. Burt Knack of the surgery service, and at this point he recommends that the patient does have a DVT she has no present contraindication to anticoagulation from a surgery standpoint given her heme occult is negative today.  I discussed with the patient the risks and benefits of anticoagulation if she has a DVT. In particular we discussed that she could certainly redevelop GI bleeding which could be life threatening, but in addition having a DVT that is untreated can also be life threatening. After shared medical decision making and discussion of the risks of anticoagulation the plan is to place her on anticoagulation, for which we will use Xarelto because her renal function is normal, should she have recurrent DVT on Doppler.  Patient will follow up very closely with her doctor and we specific we discussed return precautions including any weakness, dark stool, bloody stool, vomiting of dark or bloody emesis, severe headache in the risks and required to return to ER right away should she have any fall or head injury.  Patient is very agreeable with this plan.  Care and disposition assigned to Dr. Corky Downs who will follow-up on results of venous Doppler study. ____________________________________________   FINAL CLINICAL IMPRESSION(S) / ED DIAGNOSES  Final diagnoses:  Swelling of left lower extremity      Delman Kitten, MD 05/18/15 2113

## 2015-05-18 NOTE — Discharge Instructions (Signed)
It is extremely important that you follow-up with your doctor this week, preferably Monday or Tuesday.  Use Xarelto only as prescribed. Return to the emergency room right away you notice blood in your stool or ostomy, dark stool, you vomit blood, feel weak or develop abdominal pain, you have a headache, fall and injury you head, or other new concerns arise. Xarelto puts you at high risk for bleeding, but given that you have a possible blood clot it would appear that the benefits of Xarelto do outweigh the risks.

## 2015-05-18 NOTE — ED Notes (Signed)
Lab called for blood orders

## 2015-05-18 NOTE — ED Notes (Signed)
Report called to [facility] at [facility]

## 2015-05-18 NOTE — ED Notes (Signed)
Patient transported to Ultrasound 

## 2015-05-18 NOTE — ED Notes (Addendum)
POC shared with [name], [phone], granddaughter and POA

## 2015-05-18 NOTE — ED Notes (Signed)
Pt present to ED via EMS w/ c/o 'confirmed DVT" from [facility].  Per EMS, pt had DVT confirmed today, pt/EMS unable to tell me who confirmed.  Per EMS pt has hx of DVTs.  Pt denies CP, SOB and n/v.

## 2015-05-23 ENCOUNTER — Non-Acute Institutional Stay (SKILLED_NURSING_FACILITY): Payer: Medicare Other | Admitting: Nurse Practitioner

## 2015-05-23 ENCOUNTER — Encounter: Payer: Self-pay | Admitting: Nurse Practitioner

## 2015-05-23 DIAGNOSIS — D45 Polycythemia vera: Secondary | ICD-10-CM

## 2015-05-23 DIAGNOSIS — D62 Acute posthemorrhagic anemia: Secondary | ICD-10-CM | POA: Diagnosis not present

## 2015-05-23 DIAGNOSIS — R5381 Other malaise: Secondary | ICD-10-CM | POA: Diagnosis not present

## 2015-05-23 DIAGNOSIS — Z9889 Other specified postprocedural states: Secondary | ICD-10-CM

## 2015-05-23 DIAGNOSIS — Z9049 Acquired absence of other specified parts of digestive tract: Secondary | ICD-10-CM

## 2015-05-23 DIAGNOSIS — A499 Bacterial infection, unspecified: Secondary | ICD-10-CM

## 2015-05-23 DIAGNOSIS — L089 Local infection of the skin and subcutaneous tissue, unspecified: Secondary | ICD-10-CM

## 2015-05-23 DIAGNOSIS — I82403 Acute embolism and thrombosis of unspecified deep veins of lower extremity, bilateral: Secondary | ICD-10-CM

## 2015-05-23 DIAGNOSIS — B9689 Other specified bacterial agents as the cause of diseases classified elsewhere: Secondary | ICD-10-CM

## 2015-05-23 NOTE — Progress Notes (Signed)
Patient ID: Yolanda Wells, female   DOB: 10-19-1932, 81 y.o.   MRN: [id number]    Nursing Home Location:  [place] of Service: SNF (31)  PCP: No primary care provider on file.  No Known Allergies  Chief Complaint  Patient presents with  . Discharge Note    Discharge from SNF    HPI:  Patient is a 81 y.o. female seen today at [facility] for discharge. She has  HTN, polycythemia vera, and hx of DVT Pt here for short term rehabilitation post hospital admission from 04/01/15 - 04/08/15 with GI bleed. She had catheter placement to splenic flexure branch of inferior mesenteric artery and micro bead embolization to the splenic flexure branch but continued to bleed now with left colectomy with colostomy. Since she has been at [facility] place pt has had infection of  surgical site which has now resolved and was found to have bilateral DVTs currently being treatment with xarelto.  Granddaughter is working with SW for best plans for discharge, ideally discharge to AL was recommended but also an option was for her to go home with granddaughter who will be providing care.   Review of Systems:  Review of Systems  Constitutional: Negative for fever, activity change, appetite change, fatigue and unexpected weight change.  HENT: Negative for congestion and hearing loss.   Eyes: Negative.   Respiratory: Negative for cough and shortness of breath.   Cardiovascular: Negative for chest pain, palpitations and leg swelling.  Gastrointestinal: Negative for abdominal pain, diarrhea and constipation.  Genitourinary: Negative for dysuria and difficulty urinating.  Musculoskeletal: Negative for myalgias and arthralgias.  Skin: Positive for wound (on abdomen). Negative for color change.  Neurological: Positive for weakness. Negative for dizziness.  Psychiatric/Behavioral: Negative for behavioral problems, confusion and agitation.    Past Medical History  Diagnosis  Date  . Hypertension   . Polycythemia vera(238.4)   . Deep venous thrombosis     right lower extremity  . History of hysterectomy   . History of bilateral hip replacements    Past Surgical History  Procedure Laterality Date  . Toe amputation      right  . Joint replacement      Left and Right Hip  . Abdominal hysterectomy    . Peripheral vascular catheterization N/A 04/02/2015    Procedure: Visceral Angiography;  Surgeon: Algernon Huxley, MD;  Location: [place] [facility];  Service: Cardiovascular;  Laterality: N/A;  . Peripheral vascular catheterization N/A 04/02/2015    Procedure: Visceral Artery Intervention;  Surgeon: Algernon Huxley, MD;  Location: [place] [facility];  Service: Cardiovascular;  Laterality: N/A;  . Colectomy with colostomy creation/hartmann procedure N/A 04/03/2015    Procedure: COLECTOMY WITH COLOSTOMY CREATION/HARTMANN PROCEDURE;  Surgeon: Sherri Rad, MD;  Location: [facility];  Service: General;  Laterality: N/A;  . Cardiac catheterization Right 04/03/2015    Procedure: CENTRAL LINE INSERTION;  Surgeon: Sherri Rad, MD;  Location: [facility];  Service: General;  Laterality: Right;   Social History:   reports that she has never smoked. She does not have any smokeless tobacco history on file. She reports that she does not drink alcohol or use illicit drugs.  Family History  Problem Relation Age of Onset  . Heart attack Father   . Hypertension    . Arthritis-Osteo      Medications: Patient's Medications  New Prescriptions   No medications on file  Previous Medications  AMINO ACIDS-PROTEIN HYDROLYS (FEEDING SUPPLEMENT, PRO-STAT SUGAR FREE 64,) LIQD    Take 30 mLs by mouth 2 (two) times daily.   ONDANSETRON (ZOFRAN) 4 MG TABLET    Take 4 mg by mouth every 6 (six) hours as needed for nausea or vomiting.   OXYCODONE-ACETAMINOPHEN (PERCOCET) 2.5-325 MG PER TABLET    Take 1 tablet by mouth every 4 (four) hours as needed for pain.   POTASSIUM CHLORIDE SA (K-DUR,KLOR-CON) 20  MEQ TABLET    Take 20 mEq by mouth daily.   UNABLE TO FIND    Med Name: Med Pass 120 mL by mouth twice daily  Modified Medications   Modified Medication Previous Medication   RIVAROXABAN (XARELTO STARTER PACK) 15 & 20 MG TBPK Rivaroxaban (XARELTO STARTER PACK) 15 & 20 MG TBPK      Take as directed on package: Start with one 15mg  tablet by mouth twice a day with food. On Day 22, switch to one 20mg  tablet once a day with food.    Take as directed on package: Start with one 15mg  tablet by mouth twice a day with food. On Day 22, switch to one 20mg  tablet once a day with food.  Discontinued Medications   AMLODIPINE (NORVASC) 5 MG TABLET    Take 5 mg by mouth daily.   HYDROXYUREA (HYDREA) 500 MG CAPSULE    Take 1,000 mg by mouth daily. May take with food to minimize GI side effects.   LEVOFLOXACIN (LEVAQUIN) 750 MG TABLET    Take 1 tablet (750 mg total) by mouth daily.   NAPROXEN (NAPROSYN) 375 MG TABLET    Take 375 mg by mouth daily.   ONDANSETRON (ZOFRAN) 4 MG/2ML SOLN INJECTION    Inject 2 mLs (4 mg total) into the vein every 6 (six) hours as needed for nausea.     Physical Exam: Filed Vitals:   05/23/15 1037  BP: 117/73  Pulse: 96  Temp: 97.9 F (36.6 C)  TempSrc: Oral  Resp: 16  Height: 5\' 7"  (1.702 m)  Weight: 220 lb (99.791 kg)  SpO2: 96%    Physical Exam  Constitutional: She appears well-developed and well-nourished. No distress.  HENT:  Head: Normocephalic and atraumatic.  Mouth/Throat: Oropharynx is clear and moist. No oropharyngeal exudate.  Eyes: Conjunctivae are normal. Pupils are equal, round, and reactive to light.  Neck: Normal range of motion. Neck supple.  Cardiovascular: Normal rate, regular rhythm and normal heart sounds.   Pulmonary/Chest: Effort normal and breath sounds normal.  Abdominal: Soft. Bowel sounds are normal.  abdominal surgical incision healing well with one small open area with dressing- no erythema, edema or drainage colostomy site intact     Musculoskeletal: She exhibits no edema or tenderness.  Neurological: She is alert.  Skin: Skin is warm and dry. She is not diaphoretic.  Psychiatric: She has a normal mood and affect.    Labs reviewed: Basic Metabolic Panel:  Recent Labs  04/03/15 0636 04/04/15 0415  04/13/15 1419 04/19/15 1439 05/18/15 1804  NA 140 139  < > 138 135 132*  K 3.8 3.9  < > 3.8 4.0 4.8  CL 114* 115*  < > 103 106 100*  CO2 23 23  < > 29 23 24   GLUCOSE 128* 171*  < > 103* 132* 96  BUN 15 14  < > 7 8 9   CREATININE 0.56 0.49  < > 0.51 0.63 0.43*  CALCIUM 7.3* 7.3*  < > 7.6* 7.6* 8.0*  MG 1.6* 2.0  --   --   --   --  PHOS 3.4 2.1*  --   --   --   --   < > = values in this interval not displayed. Liver Function Tests:  Recent Labs  03/22/15 1600 04/01/15 2230 04/19/15 1439  AST 18 15 16   ALT 7* 7* 7*  ALKPHOS 77 73 73  BILITOT 0.8 0.7 0.4  PROT 7.3 6.4* 5.3*  ALBUMIN 3.5 3.1* 1.9*    Recent Labs  03/22/15 1600  LIPASE 22   No results for input(s): AMMONIA in the last 8760 hours. CBC:  Recent Labs  03/22/15 1600 04/01/15 2230  04/13/15 1419 04/19/15 1439 05/18/15 1804  WBC 6.1 4.8  < > 6.9 5.5 9.2  NEUTROABS 4.9 3.8  --   --  4.6  --   HGB 14.3 12.9  < > 8.7* 8.2* 10.3*  HCT 45.7 39.5  < > 27.9* 26.4* 31.5*  MCV 106.5* 103.3*  < > 89.4 87.5 88.9  PLT 1041* 571*  < > 237 206 412  < > = values in this interval not displayed. TSH: No results for input(s): TSH in the last 8760 hours. A1C: No results found for: HGBA1C Lipid Panel: No results for input(s): CHOL, HDL, LDLCALC, TRIG, CHOLHDL, LDLDIRECT in the last 8760 hours.  Radiological Exams: US Venous Img Lower Bilateral  05/18/2015   CLINICAL DATA:  Bilateral leg pain and edema.  EXAM: BILATERAL LOWER EXTREMITY VENOUS DOPPLER ULTRASOUND  TECHNIQUE: Gray-scale sonography with graded compression, as well as color Doppler and duplex ultrasound were performed to evaluate the lower extremity deep venous systems from the level  of the common femoral vein and including the common femoral, femoral, profunda femoral, popliteal and calf veins including the posterior tibial, peroneal and gastrocnemius veins when visible. The superficial great saphenous vein was also interrogated. Spectral Doppler was utilized to evaluate flow at rest and with distal augmentation maneuvers in the common femoral, femoral and popliteal veins.  COMPARISON:  03/25/2015  FINDINGS: There is occlusive or nearly occlusive thrombus filling the bilateral lower extremity deep venous system, extending from the common femoral veins into the lower popliteal veins (and likely into the legs as the calf veins are not visible). Central propagation cannot be established on this study, but a waveform within the partially thrombosed right common femoral vein shows respiratory phasicity, consistent with IVC patency at least.  The hypoechoic left calf mass, subcutaneous or deep fascial, is again seen - 64 x 14 x 36 mm today. Although still sizable, the mass is more flattened and ovoid in appearance, and hematoma is again favored.  Critical Value/emergent results were called by telephone at the time of interpretation on 05/18/2015 at 9:41 pm to Dr. Lavonia Drafts , who verbally acknowledged these results.  IMPRESSION: 1. Diffuse occlusive deep venous thrombosis in the bilateral lower extremities. 2. Hypoechoic mass in the left calf, 6 cm in maximal dimension, persists since May 2016. There has been some contraction, and resolving hematoma is again favored. Reportedly this mass is palpable, and clinical follow-up is recommended. If no resolution, MRI is recommended.   Electronically Signed   By: Monte Fantasia M.D.   On: 05/18/2015 21:45   Assessment/Plan 1. S/P colectomy and colonoscopy  Pain controlled, incision healing well at this time, stool in ostomy bag, will have hh nursing for ongoing education on ostomy and bag care   2. Acute blood loss anemia From GI bleed, after now  s/p colectomy and colostomy -hgb stable, will need ongoing outpatient follow up  3. Polycythemia  vera Follows with heme-oncology  4. Skin infection, bacterial From surgery incision has resolved   5. DVT (deep venous thrombosis), bilateral Fount to have bilateral DVTs, recent hospitalization for GI bleed s/p colectomy and colonoscopy however left untreated can be life threatening.  Place on xarelto and will need close outpatient follow up   6.  Physical deconditioning Remains debilitated, therapy has completed treatment.  recommended AL but home with supervision and living with granddaughter also an option.  pt is stable for discharge-will need PT/OT/Nursing/HHA/SW per home health. DME needed includes WC and 3-1. Rx written.  will need to follow up with PCP within 2 weeks.      Carlos American. Harle Battiest  [facility] [phone] 8 am - 5 pm) [phone] (after hours)

## 2015-05-24 ENCOUNTER — Emergency Department: Payer: Medicare Other

## 2015-05-24 ENCOUNTER — Inpatient Hospital Stay
Admission: EM | Admit: 2015-05-24 | Discharge: 2015-05-29 | DRG: 176 | Disposition: A | Discharge status: Skilled Nursing Facility | Payer: Medicare Other | Attending: Internal Medicine | Admitting: Internal Medicine

## 2015-05-24 DIAGNOSIS — I639 Cerebral infarction, unspecified: Secondary | ICD-10-CM

## 2015-05-24 DIAGNOSIS — R000 Tachycardia, unspecified: Secondary | ICD-10-CM | POA: Diagnosis present

## 2015-05-24 DIAGNOSIS — R0602 Shortness of breath: Secondary | ICD-10-CM

## 2015-05-24 DIAGNOSIS — Z79891 Long term (current) use of opiate analgesic: Secondary | ICD-10-CM

## 2015-05-24 DIAGNOSIS — R471 Dysarthria and anarthria: Secondary | ICD-10-CM

## 2015-05-24 DIAGNOSIS — I2699 Other pulmonary embolism without acute cor pulmonale: Principal | ICD-10-CM | POA: Diagnosis present

## 2015-05-24 DIAGNOSIS — Z96643 Presence of artificial hip joint, bilateral: Secondary | ICD-10-CM | POA: Diagnosis present

## 2015-05-24 DIAGNOSIS — Z7901 Long term (current) use of anticoagulants: Secondary | ICD-10-CM

## 2015-05-24 DIAGNOSIS — Z90710 Acquired absence of both cervix and uterus: Secondary | ICD-10-CM

## 2015-05-24 DIAGNOSIS — Z933 Colostomy status: Secondary | ICD-10-CM

## 2015-05-24 DIAGNOSIS — Z79899 Other long term (current) drug therapy: Secondary | ICD-10-CM

## 2015-05-24 DIAGNOSIS — D45 Polycythemia vera: Secondary | ICD-10-CM | POA: Diagnosis present

## 2015-05-24 DIAGNOSIS — I10 Essential (primary) hypertension: Secondary | ICD-10-CM | POA: Diagnosis present

## 2015-05-24 LAB — CBC
HEMATOCRIT: 35.2 % (ref 35.0–47.0)
HEMOGLOBIN: 10.6 g/dL — AB (ref 12.0–16.0)
MCH: 23 pg — ABNORMAL LOW (ref 26.0–34.0)
MCHC: 30.2 g/dL — ABNORMAL LOW (ref 32.0–36.0)
MCV: 76 fL — AB (ref 80.0–100.0)
PLATELETS: 520 10*3/uL — AB (ref 150–440)
RBC: 4.64 MIL/uL (ref 3.80–5.20)
RDW: 25 % — ABNORMAL HIGH (ref 11.5–14.5)
WBC: 11 10*3/uL (ref 3.6–11.0)

## 2015-05-24 LAB — URINALYSIS COMPLETE WITH MICROSCOPIC (ARMC ONLY)
BILIRUBIN URINE: NEGATIVE
Glucose, UA: NEGATIVE mg/dL
Hgb urine dipstick: NEGATIVE
Ketones, ur: NEGATIVE mg/dL
LEUKOCYTES UA: NEGATIVE
Nitrite: NEGATIVE
Protein, ur: 30 mg/dL — AB
RBC / HPF: NONE SEEN RBC/hpf (ref 0–5)
Specific Gravity, Urine: 1.02 (ref 1.005–1.030)
pH: 6 (ref 5.0–8.0)

## 2015-05-24 LAB — BASIC METABOLIC PANEL
ANION GAP: 9 (ref 5–15)
BUN: 10 mg/dL (ref 6–20)
CALCIUM: 8.4 mg/dL — AB (ref 8.9–10.3)
CHLORIDE: 103 mmol/L (ref 101–111)
CO2: 24 mmol/L (ref 22–32)
CREATININE: 0.49 mg/dL (ref 0.44–1.00)
GFR calc Af Amer: >60 mL/min (ref >60)
GFR calc non Af Amer: >60 mL/min (ref >60)
Glucose, Bld: 103 mg/dL — ABNORMAL HIGH (ref 65–99)
Potassium: 4.1 mmol/L (ref 3.5–5.1)
SODIUM: 136 mmol/L (ref 135–145)

## 2015-05-24 LAB — GLUCOSE, CAPILLARY: GLUCOSE-CAPILLARY: 95 mg/dL (ref 65–99)

## 2015-05-24 MED ORDER — SODIUM CHLORIDE 0.9 % IV BOLUS (SEPSIS)
500.0000 mL | Freq: Once | INTRAVENOUS | Status: AC
  Administered 2015-05-24: 500 mL via INTRAVENOUS

## 2015-05-24 MED ORDER — IOHEXOL 350 MG/ML SOLN
100.0000 mL | Freq: Once | INTRAVENOUS | Status: AC | PRN
  Administered 2015-05-24: 100 mL via INTRAVENOUS

## 2015-05-24 NOTE — ED Provider Notes (Signed)
[facility] Emergency Department Provider Note    ____________________________________________  Time seen: On EMS arrival  I have reviewed the triage vital signs and the nursing notes.   HISTORY  Chief Complaint No chief complaint on file.   History limited by: Not Limited   HPI Yolanda Wells is a 81 y.o. female who presents to the emergency department today via EMS. EMS was initially called to the patient's living facility for help with transport out of a car then. The patient was just returning back to her nursing home after a brief stay in a rehabilitation facility where she was after being diagnosed with bilateral DVTs. She was having a hard time walking and was complaining of shortness of breath. Apparently the shortness of breath has been going on for couple of days. Unclear what the patient's ambulatory status was when she was discharged from rehabilitation today. The patient currently is not complaining of any pain.     Past Medical History  Diagnosis Date  . Hypertension   . Polycythemia vera(238.4)   . Deep venous thrombosis     right lower extremity  . History of hysterectomy   . History of bilateral hip replacements     Patient Active Problem List   Diagnosis Date Noted  . Diverticulosis of colon with hemorrhage 04/02/2015    Past Surgical History  Procedure Laterality Date  . Toe amputation      right  . Joint replacement      Left and Right Hip  . Abdominal hysterectomy    . Peripheral vascular catheterization N/A 04/02/2015    Procedure: Visceral Angiography;  Surgeon: Algernon Huxley, MD;  Location: [place] [facility];  Service: Cardiovascular;  Laterality: N/A;  . Peripheral vascular catheterization N/A 04/02/2015    Procedure: Visceral Artery Intervention;  Surgeon: Algernon Huxley, MD;  Location: [place] [facility];  Service: Cardiovascular;  Laterality: N/A;  . Colectomy with colostomy creation/hartmann procedure N/A 04/03/2015     Procedure: COLECTOMY WITH COLOSTOMY CREATION/HARTMANN PROCEDURE;  Surgeon: Sherri Rad, MD;  Location: [facility];  Service: General;  Laterality: N/A;  . Cardiac catheterization Right 04/03/2015    Procedure: CENTRAL LINE INSERTION;  Surgeon: Sherri Rad, MD;  Location: [facility];  Service: General;  Laterality: Right;    Current Outpatient Rx  Name  Route  Sig  Dispense  Refill  . Amino Acids-Protein Hydrolys (FEEDING SUPPLEMENT, PRO-STAT SUGAR FREE 64,) LIQD   Oral   Take 30 mLs by mouth 2 (two) times daily.         . ondansetron (ZOFRAN) 4 MG tablet   Oral   Take 4 mg by mouth every 6 (six) hours as needed for nausea or vomiting.         [name] oxycodone-acetaminophen (PERCOCET) 2.5-325 MG per tablet   Oral   Take 1 tablet by mouth every 4 (four) hours as needed for pain.   30 tablet   0   . potassium chloride SA (K-DUR,KLOR-CON) 20 MEQ tablet   Oral   Take 20 mEq by mouth daily.         . Rivaroxaban (XARELTO STARTER PACK) 15 & 20 MG TBPK      Take as directed on package: Start with one 15mg  tablet by mouth twice a day with food. On Day 22, switch to one 20mg  tablet once a day with food.   51 each   0   . UNABLE TO FIND      Med  Name: Med Pass 120 mL by mouth twice daily           Allergies Review of patient's allergies indicates no known allergies.  Family History  Problem Relation Age of Onset  . Heart attack Father   . Hypertension    . Arthritis-Osteo      Social History History  Substance Use Topics  . Smoking status: Never Smoker   . Smokeless tobacco: Not on file  . Alcohol Use: No    Review of Systems  Constitutional: Negative for fever. Cardiovascular: Negative for chest pain. Respiratory: positive for shortness of breath. Gastrointestinal: Negative for abdominal pain, vomiting and diarrhea. Genitourinary: Negative for dysuria. Musculoskeletal: Negative for back pain. Skin: Negative for rash. Neurological: Negative for headaches, focal  weakness or numbness.   10-point ROS otherwise negative.  ____________________________________________   PHYSICAL EXAM:  VITAL SIGNS: ED Triage Vitals  Enc Vitals Group     BP 05/24/15 1855 128/78 mmHg     Pulse Rate 05/24/15 1855 102     Resp 05/24/15 1855 23     Temp 05/24/15 1855 97.5 F (36.4 C)     Temp Source 05/24/15 1855 Oral     SpO2 05/24/15 1855 98 %   Constitutional: Alert and oriented. No obvious distress Eyes: Conjunctivae are normal. PERRL. Normal extraocular movements. ENT   Head: Normocephalic and atraumatic.   Nose: No congestion/rhinnorhea.   Mouth/Throat: Mucous membranes are moist.   Neck: No stridor. Hematological/Lymphatic/Immunilogical: No cervical lymphadenopathy. Cardiovascular: Tachycardic, regular rhythm.  No murmurs, rubs, or gallops. Respiratory: Normal respiratory effort without tachypnea nor retractions. Breath sounds are clear and equal bilaterally. No wheezes/rales/rhonchi. Gastrointestinal: Soft and nontender. No distention.  Genitourinary: Deferred Musculoskeletal: Normal range of motion in all extremities. No joint effusions.  Bilateral lower extremity edema Neurologic:  Normal speech and language. No gross focal neurologic deficits are appreciated. Speech is normal.  Skin:  Skin is warm, dry and intact. No rash noted. Psychiatric: Mood and affect are normal. Speech and behavior are normal. Patient exhibits appropriate insight and judgment.  ____________________________________________    LABS (pertinent positives/negatives)  Labs Reviewed  BASIC METABOLIC PANEL - Abnormal; Notable for the following:    Glucose, Bld 103 (*)    Calcium 8.4 (*)    All other components within normal limits  CBC - Abnormal; Notable for the following:    Hemoglobin 10.6 (*)    MCV 76.0 (*)    MCH 23.0 (*)    MCHC 30.2 (*)    RDW 25.0 (*)    Platelets 520 (*)    All other components within normal limits  URINALYSIS COMPLETEWITH  MICROSCOPIC ([facility] ONLY) - Abnormal; Notable for the following:    Color, Urine YELLOW (*)    APPearance HAZY (*)    Protein, ur 30 (*)    Bacteria, UA RARE (*)    Squamous Epithelial / LPF 6-30 (*)    All other components within normal limits  GLUCOSE, CAPILLARY  TROPONIN I  CBG MONITORING, ED     ____________________________________________   EKG  I, Nance Pear, attending physician, personally viewed and interpreted this EKG  EKG Time: 2130 Rate: 94 Rhythm: normal sinus rhythm Axis: left axis deviation Intervals: qtc 470 QRS: RBBB, q waves II, III, aVF ST changes: no st elevation    ____________________________________________    RADIOLOGY  CTA PE pending  ____________________________________________   PROCEDURES  Procedure(s) performed: None  Critical Care performed: No  ____________________________________________   INITIAL IMPRESSION / ASSESSMENT  AND PLAN / ED COURSE  Pertinent labs & imaging results that were available during my care of the patient were reviewed by me and considered in my medical decision making (see chart for details).  Patient presents to the emergency department today because of concerns for shortness breath or difficulty walking. Given the patient was recently diagnosed with DVTs do have concern for PE. Will obtain CTA PE  ____________________________________________   FINAL CLINICAL IMPRESSION(S) / ED DIAGNOSES  Final diagnoses:  Shortness of breath     Nance Pear, MD 05/24/15 2258

## 2015-05-24 NOTE — ED Notes (Signed)
According to EMS, pt c/o  Generalized weakness and inability to walk from her vehicle using a walker to her home. Pt presents to ED A+OX4.

## 2015-05-25 ENCOUNTER — Encounter
Admission: EM | Disposition: A | Discharge status: Skilled Nursing Facility | Payer: Self-pay | Source: Home / Self Care | Attending: Internal Medicine

## 2015-05-25 ENCOUNTER — Inpatient Hospital Stay: Payer: Medicare Other

## 2015-05-25 ENCOUNTER — Inpatient Hospital Stay
Admit: 2015-05-25 | Discharge: 2015-05-25 | Disposition: A | Discharge status: Home / Self Care | Payer: Medicare Other | Attending: Internal Medicine | Admitting: Internal Medicine

## 2015-05-25 ENCOUNTER — Emergency Department: Payer: Medicare Other

## 2015-05-25 DIAGNOSIS — I2699 Other pulmonary embolism without acute cor pulmonale: Secondary | ICD-10-CM | POA: Diagnosis present

## 2015-05-25 DIAGNOSIS — Z933 Colostomy status: Secondary | ICD-10-CM | POA: Diagnosis not present

## 2015-05-25 DIAGNOSIS — I10 Essential (primary) hypertension: Secondary | ICD-10-CM | POA: Diagnosis present

## 2015-05-25 DIAGNOSIS — I63431 Cerebral infarction due to embolism of right posterior cerebral artery: Secondary | ICD-10-CM

## 2015-05-25 DIAGNOSIS — Z79891 Long term (current) use of opiate analgesic: Secondary | ICD-10-CM | POA: Diagnosis not present

## 2015-05-25 DIAGNOSIS — Z96643 Presence of artificial hip joint, bilateral: Secondary | ICD-10-CM | POA: Diagnosis present

## 2015-05-25 DIAGNOSIS — R000 Tachycardia, unspecified: Secondary | ICD-10-CM | POA: Diagnosis present

## 2015-05-25 DIAGNOSIS — G458 Other transient cerebral ischemic attacks and related syndromes: Secondary | ICD-10-CM | POA: Diagnosis not present

## 2015-05-25 DIAGNOSIS — Z90710 Acquired absence of both cervix and uterus: Secondary | ICD-10-CM | POA: Diagnosis not present

## 2015-05-25 DIAGNOSIS — R0602 Shortness of breath: Secondary | ICD-10-CM | POA: Diagnosis present

## 2015-05-25 DIAGNOSIS — Z79899 Other long term (current) drug therapy: Secondary | ICD-10-CM | POA: Diagnosis not present

## 2015-05-25 DIAGNOSIS — D45 Polycythemia vera: Secondary | ICD-10-CM | POA: Diagnosis present

## 2015-05-25 DIAGNOSIS — Z7901 Long term (current) use of anticoagulants: Secondary | ICD-10-CM | POA: Diagnosis not present

## 2015-05-25 DIAGNOSIS — Z95828 Presence of other vascular implants and grafts: Secondary | ICD-10-CM

## 2015-05-25 HISTORY — DX: Presence of other vascular implants and grafts: Z95.828

## 2015-05-25 HISTORY — PX: PERIPHERAL VASCULAR CATHETERIZATION: SHX172C

## 2015-05-25 LAB — HEMOGLOBIN A1C: Hgb A1c MFr Bld: 5.1 % (ref 4.0–6.0)

## 2015-05-25 LAB — TSH: TSH: 2.355 u[IU]/mL (ref 0.350–4.500)

## 2015-05-25 LAB — TROPONIN I: Troponin I: <0.03 ng/mL (ref <0.031)

## 2015-05-25 SURGERY — IVC FILTER INSERTION
Anesthesia: Moderate Sedation

## 2015-05-25 MED ORDER — SODIUM CHLORIDE 0.9 % IJ SOLN
3.0000 mL | Freq: Two times a day (BID) | INTRAMUSCULAR | Status: DC
Start: 2015-05-25 — End: 2015-05-29
  Administered 2015-05-25 – 2015-05-29 (×9): 3 mL via INTRAVENOUS

## 2015-05-25 MED ORDER — MIDAZOLAM HCL 2 MG/2ML IJ SOLN
INTRAMUSCULAR | Status: DC | PRN
  Administered 2015-05-25: 2 mg via INTRAVENOUS

## 2015-05-25 MED ORDER — RIVAROXABAN 15 MG PO TABS
15.0000 mg | ORAL_TABLET | Freq: Two times a day (BID) | ORAL | Status: DC
  Administered 2015-05-25 – 2015-05-29 (×8): 15 mg via ORAL
  Filled 2015-05-25 (×8): qty 1

## 2015-05-25 MED ORDER — ACETAMINOPHEN 325 MG PO TABS: 650.0000 mg | ORAL_TABLET | Freq: Four times a day (QID) | ORAL | Status: DC | PRN

## 2015-05-25 MED ORDER — ONDANSETRON HCL 4 MG/2ML IJ SOLN: 4.0000 mg | Freq: Four times a day (QID) | INTRAMUSCULAR | Status: DC | PRN

## 2015-05-25 MED ORDER — HEPARIN (PORCINE) IN NACL 2-0.9 UNIT/ML-% IJ SOLN
INTRAMUSCULAR | Status: AC
  Filled 2015-05-25: qty 500

## 2015-05-25 MED ORDER — ACETAMINOPHEN 650 MG RE SUPP: 650.0000 mg | Freq: Four times a day (QID) | RECTAL | Status: DC | PRN

## 2015-05-25 MED ORDER — CEFAZOLIN SODIUM 1-5 GM-% IV SOLN
INTRAVENOUS | Status: AC
  Administered 2015-05-25: 18:00:00
  Filled 2015-05-25: qty 50

## 2015-05-25 MED ORDER — OXYCODONE-ACETAMINOPHEN 5-325 MG PO TABS: 0.5000 | ORAL_TABLET | ORAL | Status: DC | PRN

## 2015-05-25 MED ORDER — ONDANSETRON HCL 4 MG PO TABS: 4.0000 mg | ORAL_TABLET | Freq: Four times a day (QID) | ORAL | Status: DC | PRN

## 2015-05-25 MED ORDER — LIDOCAINE HCL (PF) 1 % IJ SOLN
INTRAMUSCULAR | Status: AC
  Filled 2015-05-25: qty 10

## 2015-05-25 MED ORDER — DOCUSATE SODIUM 100 MG PO CAPS
100.0000 mg | ORAL_CAPSULE | Freq: Two times a day (BID) | ORAL | Status: DC
  Administered 2015-05-25 – 2015-05-29 (×7): 100 mg via ORAL
  Filled 2015-05-25 (×9): qty 1

## 2015-05-25 MED ORDER — POTASSIUM CHLORIDE CRYS ER 20 MEQ PO TBCR
20.0000 meq | EXTENDED_RELEASE_TABLET | Freq: Every day | ORAL | Status: DC
  Administered 2015-05-25 – 2015-05-29 (×5): 20 meq via ORAL
  Filled 2015-05-25 (×5): qty 1

## 2015-05-25 MED ORDER — MORPHINE SULFATE 2 MG/ML IJ SOLN: 1.0000 mg | INTRAMUSCULAR | Status: DC | PRN

## 2015-05-25 MED ORDER — IOHEXOL 300 MG/ML  SOLN
INTRAMUSCULAR | Status: DC | PRN
  Administered 2015-05-25: 15 mL via INTRAVENOUS

## 2015-05-25 MED ORDER — PRO-STAT SUGAR FREE PO LIQD
30.0000 mL | Freq: Two times a day (BID) | ORAL | Status: DC
  Administered 2015-05-25 – 2015-05-29 (×6): 30 mL via ORAL

## 2015-05-25 MED ORDER — MIDAZOLAM HCL 2 MG/2ML IJ SOLN
INTRAMUSCULAR | Status: AC
  Filled 2015-05-25: qty 2

## 2015-05-25 MED ORDER — CEFAZOLIN SODIUM 1-5 GM-% IV SOLN
1.0000 g | INTRAVENOUS | Status: AC
  Filled 2015-05-25: qty 50

## 2015-05-25 SURGICAL SUPPLY — 4 items
CANNULA 5F STIFF (CANNULA) ×3 IMPLANT
FILTER VC CELECT-FEMORAL (Filter) ×3 IMPLANT
PACK ANGIOGRAPHY (CUSTOM PROCEDURE TRAY) ×3 IMPLANT
WIRE J 3MM .035X145CM (WIRE) ×3 IMPLANT

## 2015-05-25 NOTE — Op Note (Signed)
[facility]   OPERATIVE NOTE    PRE-OPERATIVE DIAGNOSIS: Bilateral pulmonary emboli; right lower extremity DVT; acute cerebellar infarct  POST-OPERATIVE DIAGNOSIS: Same  PROCEDURE: 1.   Ultrasound guidance for vascular access to the left common femoral vein 2.   Catheter placement into the inferior vena cava 3.   Inferior venacavogram 4.   Placement of a Cook Celect IVC filter  SURGEON: Hortencia Pilar, MD  ASSISTANT(S): None  ANESTHESIA: local/sedation  ESTIMATED BLOOD LOSS: minimal  FINDING(S): 1.  Patent IVC  SPECIMEN(S):  none  INDICATIONS:   Yolanda Wells is a 81 y.o. female who presents with acute bilateral pulmonary emboli in association with right lower extremity DVT and failure of anticoagulation therapy.  Inferior vena cava filter is indicated for this reason.  Risks and benefits including filter thrombosis, migration, fracture, bleeding, and infection were all discussed.  We discussed that all IVC filters that we place can be removed if desired from the patient once the need for the filter has passed.    DESCRIPTION: After obtaining full informed written consent, the patient was brought back to the vascular suite. The skin was sterilely prepped and draped in a sterile surgical field was created. The left common femoral vein was accessed under direct ultrasound guidance without difficulty with a Seldinger needle and a J-wire was then placed. After skin nick and dilatation, the delivery sheath was placed into the inferior vena cava and an inferior venacavogram was performed. This demonstrated a patent IVC with the level of the renal veins at L1.  The filter was then deployed into the inferior vena cava at the level of L2 just below the renal veins. The delivery sheath was then removed. Pressure was held. Sterile dressings were placed. The patient tolerated the procedure well and was taken to the recovery room in stable condition.  COMPLICATIONS:  None  CONDITION: Stable  Katha Cabal  05/25/2015, 7:13 PM

## 2015-05-25 NOTE — Progress Notes (Signed)
Seen the pt, talkked to her grand daughter.  Due to acute PE on Xarelto- by admitting doctor. Suspected stroke- MRI pending.  I spoke to Neurologist- he may d/c xarelto and start heparin drip after reviewing MRI. Pt need rehab. Vascular consult for possible IVC filter.

## 2015-05-25 NOTE — Consult Note (Signed)
[facility] Vascular Consult Note  MRN : [id number]  Yolanda Wells is a 81 y.o. (09-14-1932) female who presents with chief complaint of  Chief Complaint  Patient presents with  . Shortness of Breath  . Weakness  .  History of Present Illness: The patient presented to the emergency department earlier today after being discharged from her rehabilitation facility. Apparently, her rehabilitation was not completed and she was unable to make it from her car into her home secondary to weakness and inability to stand on her own home. She also complained of some shortness of breath at the time. She denies chest pain, nausea, vomiting, or diaphoresis. Past medical history significant for known DVT. In the emergency department the patient was found to be mildly tachycardic which prompted CTA of the chest which showed bilateral pulmonary emboli. Also of note, the patient's granddaughter has noticed weakness and progressive confusion over the last week. Which led to the discovery of a cerebellar infarct.  Current Facility-Administered Medications  Medication Dose Route Frequency Provider Last Rate Last Dose  . acetaminophen (TYLENOL) tablet 650 mg  650 mg Oral Q6H PRN Harrie Foreman, MD       Or  . acetaminophen (TYLENOL) suppository 650 mg  650 mg Rectal Q6H PRN Harrie Foreman, MD      . ceFAZolin (ANCEF) 1-5 GM-% IVPB           . ceFAZolin (ANCEF) IVPB 1 g/50 mL premix  1 g Intravenous On Call Katha Cabal, MD      . docusate sodium (COLACE) capsule 100 mg  100 mg Oral BID Harrie Foreman, MD   100 mg at 05/25/15 1149  . feeding supplement (PRO-STAT SUGAR FREE 64) liquid 30 mL  30 mL Oral BID Harrie Foreman, MD   30 mL at 05/25/15 1149  . morphine 2 MG/ML injection 1 mg  1 mg Intravenous Q3H PRN Harrie Foreman, MD      . ondansetron [facility]) tablet 4 mg  4 mg Oral Q6H PRN Harrie Foreman, MD       Or  . ondansetron [facility]) injection 4 mg  4 mg Intravenous  Q6H PRN Harrie Foreman, MD      . oxyCODONE-acetaminophen (PERCOCET/ROXICET) 5-325 MG per tablet 0.5 tablet  0.5 tablet Oral Q4H PRN Harrie Foreman, MD      . potassium chloride SA (K-DUR,KLOR-CON) CR tablet 20 mEq  20 mEq Oral Daily Harrie Foreman, MD   20 mEq at 05/25/15 1149  . Rivaroxaban (XARELTO) tablet 15 mg  15 mg Oral BID WC Harrie Foreman, MD   15 mg at 05/25/15 1155  . sodium chloride 0.9 % injection 3 mL  3 mL Intravenous Q12H Harrie Foreman, MD   3 mL at 05/25/15 1149    Past Medical History  Diagnosis Date  . Hypertension   . Polycythemia vera(238.4)   . Deep venous thrombosis     right lower extremity  . History of hysterectomy   . History of bilateral hip replacements     Past Surgical History  Procedure Laterality Date  . Toe amputation      right  . Joint replacement      Left and Right Hip  . Abdominal hysterectomy    . Peripheral vascular catheterization N/A 04/02/2015    Procedure: Visceral Angiography;  Surgeon: Algernon Huxley, MD;  Location: [place] [facility];  Service: Cardiovascular;  Laterality: N/A;  .  Peripheral vascular catheterization N/A 04/02/2015    Procedure: Visceral Artery Intervention;  Surgeon: Algernon Huxley, MD;  Location: [place] [facility];  Service: Cardiovascular;  Laterality: N/A;  . Colectomy with colostomy creation/hartmann procedure N/A 04/03/2015    Procedure: COLECTOMY WITH COLOSTOMY CREATION/HARTMANN PROCEDURE;  Surgeon: Sherri Rad, MD;  Location: [facility];  Service: General;  Laterality: N/A;  . Cardiac catheterization Right 04/03/2015    Procedure: CENTRAL LINE INSERTION;  Surgeon: Sherri Rad, MD;  Location: [facility];  Service: General;  Laterality: Right;    Social History History  Substance Use Topics  . Smoking status: Never Smoker   . Smokeless tobacco: Not on file  . Alcohol Use: No    Family History Family History  Problem Relation Age of Onset  . Heart attack Father   . Hypertension    . Arthritis-Osteo      no family history of porphyria or autoimmune disease  No Known Allergies   REVIEW OF SYSTEMS (Negative unless checked)  Constitutional: [] Weight loss  [] Fever  [] Chills Cardiac: [] Chest pain   [] Chest pressure   [] Palpitations   [x] Shortness of breath when laying flat   [x] Shortness of breath at rest   [x] Shortness of breath with exertion. Vascular:  [] Pain in legs with walking   [] Pain in legs at rest   [] Pain in legs when laying flat   [] Claudication   [] Pain in feet when walking  [] Pain in feet at rest  [] Pain in feet when laying flat   [x] History of DVT   [] Phlebitis   [] Swelling in legs   [] Varicose veins   [] Non-healing ulcers Pulmonary:   [] Uses home oxygen   [] Productive cough   [] Hemoptysis   [] Wheeze  [] COPD   [] Asthma Neurologic:  [] Dizziness  [] Blackouts   [] Seizures   [] History of stroke   [] History of TIA  [] Aphasia   [] Temporary blindness   [] Dysphagia   [] Weakness or numbness in arms   [x] Weakness or numbness in legs Musculoskeletal:  [] Arthritis   [x] Joint swelling   [] Joint pain   [] Low back pain Hematologic:  [] Easy bruising  [] Easy bleeding   [] Hypercoagulable state   [] Anemic  [] Hepatitis Gastrointestinal:  [] Blood in stool   [] Vomiting blood  [] Gastroesophageal reflux/heartburn   [] Difficulty swallowing. Genitourinary:  [] Chronic kidney disease   [] Difficult urination  [] Frequent urination  [] Burning with urination   [] Blood in urine Skin:  [] Rashes   [] Ulcers   [] Wounds Psychological:  [] History of anxiety   []  History of major depression.   Physical Examination  Filed Vitals:   05/25/15 0700 05/25/15 0752 05/25/15 0825 05/25/15 1144  BP: 115/74 111/70 119/73 116/65  Pulse: 83 86 86 82  Temp:   98.2 F (36.8 C) 97.9 F (36.6 C)  TempSrc:   Oral Oral  Resp: 21 18 18 18   Height:   5\' 7"  (1.702 m)   Weight:   212 lb 3.2 oz (96.253 kg)   SpO2: 100% 100% 100% 100%   Body mass index is 33.23 kg/(m^2).  Head: [place]/AT, No temporalis wasting. Prominent temp pulse  not noted. Ear/Nose/Throat: Nares w/o erythema or drainage, oropharynx w/o obsrtuction, Mallampati score: Class III.  Dentition poor.  Eyes: PERRLA, Sclera nonicteric.  Neck: Supple, no nuchal rigidity.  No bruit or JVD.  Pulmonary:  Breath sounds equal bilaterally, no use of accessory muscles.  Cardiac: RRR, normal S1, S2, no Murmurs, rubs or gallops. Vascular: Mild edema bilateral lower extremities trace pedal pulses Gastrointestinal: soft, non-tender, non-distended.  Musculoskeletal: Moves  all extremities.  No deformity or atrophy. No edema. Neurologic: CN 2-12 intact. Symmetrical.  Speech is fluent.  Psychiatric: Judgment intact, Mood & affect appropriate for pt's clinical situation. Dermatologic: No rashes or ulcers noted.  No cellulitis or open wounds. Lymph : No Cervical,  or Inguinal lymphadenopathy.      CBC Lab Results  Component Value Date   WBC 11.0 05/24/2015   HGB 10.6* 05/24/2015   HCT 35.2 05/24/2015   MCV 76.0* 05/24/2015   PLT 520* 05/24/2015    BMET    Component Value Date/Time   NA 136 05/24/2015 1913   NA 137 04/10/2015   NA 144 07/08/2014 0936   K 4.1 05/24/2015 1913   K 3.7 07/08/2014 0936   CL 103 05/24/2015 1913   CL 108* 07/08/2014 0936   CO2 24 05/24/2015 1913   CO2 27 07/08/2014 0936   GLUCOSE 103* 05/24/2015 1913   GLUCOSE 90 07/08/2014 0936   BUN 10 05/24/2015 1913   BUN 6 04/10/2015   BUN 15 07/08/2014 0936   CREATININE 0.49 05/24/2015 1913   CREATININE 0.4* 04/10/2015   CREATININE 0.64 07/08/2014 0936   CALCIUM 8.4* 05/24/2015 1913   CALCIUM 8.4* 07/08/2014 0936   GFRNONAA >60 05/24/2015 1913   GFRNONAA >60 07/08/2014 0936   GFRAA >60 05/24/2015 1913   GFRAA >60 07/08/2014 0936   Estimated Creatinine Clearance: 65.7 mL/min (by C-G formula based on Cr of 0.49).  COAG Lab Results  Component Value Date   INR 1.37 05/18/2015   INR 1.34 04/03/2015   INR 1.25 04/01/2015    Radiology 1.  CT angiography of the chest demonstrates  bilateral pulmonary emboli 2.  MRI of the brain demonstrates cerebellar infarct 3.  Carotid ultrasound demonstrates minimal plaque bilaterally 4.  duplex ultrasound of the lower extremities dated July 8 demonstrates DVT in the right lower extremity up to and including the common femoral   Assessment/Plan 1.  Pulmonary embolism: Bilateral; patient has been re-started on Xarelto. (The patient had been on treatment for DVT previously). There is a concern that her medication may not have been administered appropriately prior to admission. I have restarted the initiation phase of rivaroxaban. The patient is not hypoxic. Given the large volume of pulmonary emboli Sr. with an acute stroke which could represent paradoxical embolization further insult could be catastrophic. Although there is concern that she may not have been receiving her Xarelto this has not been proven and therefore she must be considered a failure while on anticoagulation. For these 2 reasons IVC filter should be placed. The risks and benefits of IVC filter placement was reviewed with the patient as well as her granddaughter all questions are answered. Patient wishes to proceed with filter placement.  2.  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 The patient was clearly dysarthric on my exam which was new to the emergency department staff as well as this examiner as I have admitted the patient to the hospital before. I ordered CT of the head showed likely cerebellar infarct. This explains her slurred speech but is more concerning for paradoxical embolism. I have ordered an echocardiogram as well as Doppler ultrasounds of her carotid arteries. Cardiology consult placed. MRI brain ordered. 3.  Hypertension: Controlled at this time. Allow permissive hypertension in the setting of stroke. Resume home medications when list is verified.   Schnier, Dolores Lory, MD  05/25/2015 5:30 PM

## 2015-05-25 NOTE — Progress Notes (Signed)
Pt adm to room with granddaughter at bedside.  Skin noted with abdominal old incisional wound with colostomy noted on the left side of the abdomen draining brown soft stool  Colostomy bag changed and skin very red around the stoma.  Will continue to monitor the site.  Pt noted to have a right half foot with +1 pedial pulse and +2 on the left foot.  Old scar to the left hip from an hip replacement.  No complaints of pain or discomfort at present.

## 2015-05-25 NOTE — ED Notes (Addendum)
PLEASE CALL GRANDDAUGHTER ([name]) WITH ADMISSION ROOM # AT [phone]

## 2015-05-25 NOTE — Progress Notes (Signed)
*  PRELIMINARY RESULTS* Echocardiogram 2D Echocardiogram has been performed.  Laqueta Jean Hege 05/25/2015, 11:24 AM

## 2015-05-25 NOTE — Progress Notes (Signed)
Pt went down to have an IV filter inserted.

## 2015-05-25 NOTE — Care Management (Signed)
Spoke with Plum from [facility].  Patient was discharged from the facility yesterday due to lack of progress. A referral had been made to [facility] for [facility] PT OT Aide and SW.  Notified [facility] of readm.  An order for 3 in 1 chair and walker  or wheelchair had been sent to [facility].  Patient's granddaughter Yolanda Wells says that if it is found that patient will require continued skilled nursing facility care after this hospitalization, she is agreeable to consider but under no circumstances would she ever consider [facility]

## 2015-05-25 NOTE — Care Management (Signed)
Patient presented to the ED 7/8 from [facility] and diagnosed with DVT.  She returned to [facility] [address].  She was discharged from [facility] 7/14 to home and presented to the ED for weakness and shortness of breath.  She is now diagnosed with bilateral pulmonary embolus.  Have left message with Isaias Cowman to discuss her rehab at [facility] and discharge services that might have been initiated.  It is documented that granddaughter reports that patient has had increased confusion over the last weak.  Brain MRI is pending for concern of paradoxical emboli to the brain.  At present do not know if IVC filter is being considered.  Patient continues to be treated with Xarelto.  Currently on 02 which is not chronic. Room air sats in the ED 98%

## 2015-05-25 NOTE — Progress Notes (Signed)
Received pt back from special procedure for IV filter.  Dresssing intact and very small drainage noted at the left femoral areas.  Pt was educated to stay flat till 8pm and keep leg straight.   Pt complaining of being hungry and food tray was ordered.

## 2015-05-25 NOTE — Consult Note (Signed)
CC: generalized weakness   HPI: Yolanda Wells is an 81 y.o. female presents to the emergency department via EMS after being discharged from her rehabilitation facility today but being too weak to stand on her way into her own home as well as found to be SOB. Imaging positive for PE and pt was started on xarelto.  Pt seems very fatigued and difficulty following commands.  CTH showed ? Inf cerebellar infarct.     Past Medical History  Diagnosis Date  . Hypertension   . Polycythemia vera(238.4)   . Deep venous thrombosis     right lower extremity  . History of hysterectomy   . History of bilateral hip replacements     Past Surgical History  Procedure Laterality Date  . Toe amputation      right  . Joint replacement      Left and Right Hip  . Abdominal hysterectomy    . Peripheral vascular catheterization N/A 04/02/2015    Procedure: Visceral Angiography;  Surgeon: Algernon Huxley, MD;  Location: [facility];  Service: Cardiovascular;  Laterality: N/A;  . Peripheral vascular catheterization N/A 04/02/2015    Procedure: Visceral Artery Intervention;  Surgeon: Algernon Huxley, MD;  Location: [facility];  Service: Cardiovascular;  Laterality: N/A;  . Colectomy with colostomy creation/hartmann procedure N/A 04/03/2015    Procedure: COLECTOMY WITH COLOSTOMY CREATION/HARTMANN PROCEDURE;  Surgeon: Sherri Rad, MD;  Location: [facility];  Service: General;  Laterality: N/A;  . Cardiac catheterization Right 04/03/2015    Procedure: CENTRAL LINE INSERTION;  Surgeon: Sherri Rad, MD;  Location: [facility];  Service: General;  Laterality: Right;    Family History  Problem Relation Age of Onset  . Heart attack Father   . Hypertension    . Arthritis-Osteo      Social History:  reports that she has never smoked. She does not have any smokeless tobacco history on file. She reports that she does not drink alcohol or use illicit drugs.  No Known Allergies  Medications: I have reviewed the  patient's current medications.  ROS: Unable to obtain due to fatigue   Physical Examination: Blood pressure 116/65, pulse 82, temperature 97.9 F (36.6 C), temperature source Oral, resp. rate 18, height 5\' 7"  (1.702 m), weight 96.253 kg (212 lb 3.2 oz), SpO2 100 %.   Neurological Examination Mental Status: Dysarthric speech with generalized weakness.  Cranial Nerves: II: Discs flat bilaterally; Visual fields grossly normal III,IV, VI: ptosis not present, extra-ocular motions intact bilaterally V,VII: smile symmetric, facial light touch sensation normal bilaterally VIII: hearing normal bilaterally IX,X: gag reflex present XI: bilateral shoulder shrug XII: midline tongue extension Motor: Right : Upper extremity   4/5    Left:     Upper extremity   4/5  Lower extremity   4/5     Lower extremity   4/5 Tone and bulk:normal tone throughout; no atrophy noted Sensory: Pinprick and light touch intact throughout, bilaterally Deep Tendon Reflexes: 1+ and symmetric throughout Plantars: Right: downgoing   Left: downgoing Cerebellar: Not tested  Gait: not tested       Laboratory Studies:   Basic Metabolic Panel:  Recent Labs Lab 05/18/15 1804 05/24/15 1913  NA 132* 136  K 4.8 4.1  CL 100* 103  CO2 24 24  GLUCOSE 96 103*  BUN 9 10  CREATININE 0.43* 0.49  CALCIUM 8.0* 8.4*    Liver Function Tests: No results for input(s): AST, ALT, ALKPHOS, BILITOT, PROT, ALBUMIN in  the last 168 hours. No results for input(s): LIPASE, AMYLASE in the last 168 hours. No results for input(s): AMMONIA in the last 168 hours.  CBC:  Recent Labs Lab 05/18/15 1804 05/24/15 1913  WBC 9.2 11.0  HGB 10.3* 10.6*  HCT 31.5* 35.2  MCV 88.9 76.0*  PLT 412 520*    Cardiac Enzymes:  Recent Labs Lab 05/24/15 2344  TROPONINI <0.03    BNP: Invalid input(s): POCBNP  CBG:  Recent Labs Lab 05/24/15 2011  [place] 95    Microbiology: Results for orders placed or performed during the  hospital encounter of 04/19/15  Blood Culture (routine x 2)     Status: None   Collection Time: 04/19/15  3:00 PM  Result Value Ref Range Status   Specimen Description BLOOD  Final   Special Requests NONE  Final   Culture NO GROWTH 5 DAYS  Final   Report Status 04/24/2015 FINAL  Final  Blood Culture (routine x 2)     Status: None   Collection Time: 04/19/15  3:00 PM  Result Value Ref Range Status   Specimen Description BLOOD  Final   Special Requests NONE  Final   Culture NO GROWTH 5 DAYS  Final   Report Status 04/24/2015 FINAL  Final  Urine culture     Status: None   Collection Time: 04/19/15  3:02 PM  Result Value Ref Range Status   Specimen Description URINE, RANDOM  Final   Special Requests NONE  Final   Culture NO GROWTH 2 DAYS  Final   Report Status 04/21/2015 FINAL  Final    Coagulation Studies: No results for input(s): LABPROT, INR in the last 72 hours.  Urinalysis:  Recent Labs Lab 05/24/15 1913  COLORURINE YELLOW*  LABSPEC 1.020  PHURINE 6.0  GLUCOSEU NEGATIVE  HGBUR NEGATIVE  BILIRUBINUR NEGATIVE  KETONESUR NEGATIVE  PROTEINUR 30*  NITRITE NEGATIVE  LEUKOCYTESUR NEGATIVE    Lipid Panel:  No results found for: CHOL, TRIG, HDL, CHOLHDL, VLDL, LDLCALC  HgbA1C: No results found for: HGBA1C  Urine Drug Screen:  No results found for: LABOPIA, COCAINSCRNUR, LABBENZ, AMPHETMU, THCU, LABBARB  Alcohol Level: No results for input(s): ETH in the last 168 hours.   Imaging: Ct Head Wo Contrast  05/25/2015   CLINICAL DATA:  Dysarthria. History of pulmonary embolism, polycythemia vera.  EXAM: CT HEAD WITHOUT CONTRAST  TECHNIQUE: Contiguous axial images were obtained from the base of the skull through the vertex without intravenous contrast.  COMPARISON:  None.  FINDINGS: The ventricles and sulci are normal for age ; dense probable choroid plexus extends to the foramen of Monro. Moderate disproportionate cerebellar and pontine volume loss. No intraparenchymal  hemorrhage, mass effect nor midline shift. Patchy supratentorial white matter hypodensities are less than expected for patient's age and though non-specific suggest sequelae of chronic small vessel ischemic disease. No acute large vascular territory infarcts. 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  No abnormal extra-axial fluid collections. Mildly prominent LEFT frontal extra-axial space, nonspecific. Basal cisterns are patent. Mild calcific atherosclerosis of the carotid siphons.  No skull fracture. The included ocular globes and orbital contents are non-suspicious. Bilateral ocular lens implants. The mastoid aircells and included paranasal sinuses are well-aerated.  IMPRESSION: No acute intracranial process.  Probable choroid plexus extending to the foramen of Monro, unlikely to represent blood products though, if clinically indicated, recommend follow-up CT head in 6 hours to verify stability of findings.  Disproportionate cerebellar and pontine volume loss with small suspected RIGHT inferior cerebellar  infarct.   Electronically Signed   By: Elon Alas M.D.   On: 05/25/2015 02:22   Ct Angio Chest Pe W/cm &/or Wo Cm  05/24/2015   CLINICAL DATA:  Generalized weakness and inability to walk. Shortness of breath. History of DVTs.  EXAM: CT ANGIOGRAPHY CHEST WITH CONTRAST  TECHNIQUE: Multidetector CT imaging of the chest was performed using the standard protocol during bolus administration of intravenous contrast. Multiplanar CT image reconstructions and MIPs were obtained to evaluate the vascular anatomy.  CONTRAST:  164mL OMNIPAQUE IOHEXOL 350 MG/ML SOLN  COMPARISON:  02/19/2012  FINDINGS: Technically adequate study with good opacification of the central and segmental pulmonary arteries. Filling defects are demonstrated in segmental branch vessels to the right lower lung consistent with focal pulmonary embolus. There is volume loss and infiltration in the right lower lung which may indicate  pulmonary infarct. RT to LV ratio equals 0.98, representing upper limits of normal. Right heart is noted to be enlarged also on the previous study in this may represent typical pattern for this patient. Clinical correlation for possible right heart strain is suggested.  Normal caliber thoracic aorta. No aortic dissection. Great vessel origins are patent. Calcification in the aorta and Coronary arteries. Calcification in the mitral valve annulus. No significant lymphadenopathy in the chest. Esophagus is decompressed.  Atelectasis in the lung bases. No focal consolidation. No pneumothorax. No pleural effusions. Airways appear patent.  Included portions of the upper abdominal organs demonstrate a cyst in the upper pole right kidney, accessory spleen, apparent postoperative changes in the left upper quadrant mesenteric with some stranding or scarring which may be postoperative. Degenerative changes in the spine and shoulders. Thoracic scoliosis convex towards the right. No destructive bone lesions.  Calcified dominant nodule in the right lower pole thyroid measuring 2.4 cm diameter. No change since prior study.  Review of the MIP images confirms the above findings.  IMPRESSION: Positive for right lower lobe pulmonary emboli. Atelectasis or infarct in the right lung base. Additional incidental findings as above.  These results were called by telephone at the time of interpretation on 05/24/2015 at 11:29 pm to Dr. Owens Shark, who verbally acknowledged these results.   Electronically Signed   By: Lucienne Capers M.D.   On: 05/24/2015 23:32   US Carotid Bilateral  05/25/2015   CLINICAL DATA:  CVA.  History of hypertension and syncopal episode.  EXAM: BILATERAL CAROTID DUPLEX ULTRASOUND  TECHNIQUE: Pearline Cables scale imaging, color Doppler and duplex ultrasound were performed of bilateral carotid and vertebral arteries in the neck.  COMPARISON:  None.  FINDINGS: Criteria: Quantification of carotid stenosis is based on velocity  parameters that correlate the residual internal carotid diameter with NASCET-based stenosis levels, using the diameter of the distal internal carotid lumen as the denominator for stenosis measurement.  The following velocity measurements were obtained:  RIGHT  ICA:  56/17 cm/sec  CCA:  42/59 cm/sec  SYSTOLIC ICA/CCA RATIO:  5.63  DIASTOLIC ICA/CCA RATIO:  1.1  ECA:  42 cm/sec  LEFT  ICA:  80/33 cm/sec  CCA:  87/56 cm/sec  SYSTOLIC ICA/CCA RATIO:  1.4  DIASTOLIC ICA/CCA RATIO:  2.5  ECA:  41 cm/sec  RIGHT CAROTID ARTERY: The right common carotid artery is noted to be mildly tortuous (representative image 2). There is a very minimal amount of eccentric mixed echogenic plaque within the right carotid bulb (image 11), extending to involve the origin and proximal aspects of the right internal carotid artery (image 17), not resulting in elevated peak  systolic velocities within the interrogated course of the right internal carotid artery to suggest a hemodynamically significant stenosis.  RIGHT VERTEBRAL ARTERY:  Antegrade flow  LEFT CAROTID ARTERY: There is a minimal amount of eccentric mixed echogenic plaque within the left carotid bulb (image 48), not resulting in elevated peak systolic velocities within the interrogated course of the left internal carotid artery to suggest a hemodynamically significant stenosis.  LEFT VERTEBRAL ARTERY:  Antegrade flow  IMPRESSION: Very minimal amount of bilateral atherosclerotic plaque, right subjectively greater than left, not resulting in a hemodynamically significant stenosis.   Electronically Signed   By: Sandi Mariscal M.D.   On: 05/25/2015 11:42     Assessment/Plan:  80 y.o. female presents to the emergency department via EMS after being discharged from her rehabilitation facility today but being too weak to stand on her way into her own home as well as found to be SOB. Imaging positive for PE and pt was started on xarelto.  Pt seems very fatigued and difficulty following  commands.  CTH showed ? Inf cerebellar infarct.   81 y.o. female presents to the emergency department via EMS after being discharged from her rehabilitation facility today but being too weak to stand on her way into her own home as well as found to be SOB. Imaging positive for PE and pt was started on xarelto.  Pt seems very fatigued and difficulty following commands.  CTH showed ? Inf cerebellar infarct.   Most information obtained from pt's grand daughter [name] [phone])  - MRI brain done, preliminary results, I don't see significant new ischemia.  - I would continue xarelto at this point.   - dysarthric speech in the setting of generalized weakness and possibly hypoxia - would obtain ABG. Leotis Pain   05/25/2015, 4:23 PM

## 2015-05-25 NOTE — ED Provider Notes (Signed)
I assumed care of the patient 11:00 PM from Dr. Archie Balboa. Patient was discussed with Dr. Leonidas Romberg regarding CT chest results which revealed:IMPRESSION: Positive for right lower lobe pulmonary emboli. Atelectasis or infarct in the right lung base. Additional incidental findings as following this I discussed all clinical findings with the patient and her granddaughter. She was then discussed with Dr. Marcille Blanco for hospital admission reviewed  Gregor Hams, MD 05/25/15 [id number]

## 2015-05-25 NOTE — H&P (Signed)
Yolanda Wells is an 81 y.o. female.   Chief Complaint: Weakness HPI: The patient presents to the emergency department via EMS after being discharged from her rehabilitation facility today but being too weak to stand on her way into her own home. She also complained of some shortness of breath at the time. She denies chest pain, nausea, vomiting, or diaphoresis. Past medical history significant for known DVT. In the emergency department the patient was found to be mildly tachycardic which prompted CTA of the chest which showed bilateral pulmonary emboli. Also of note, the patient's granddaughter has noticed weakness and progressive confusion over the last week. Her change in global mental status as well as pulmonary embolism prompted emergency department staff to call for admission.  Past Medical History  Diagnosis Date  . Hypertension   . Polycythemia vera(238.4)   . Deep venous thrombosis     right lower extremity  . History of hysterectomy   . History of bilateral hip replacements     Past Surgical History  Procedure Laterality Date  . Toe amputation      right  . Joint replacement      Left and Right Hip  . Abdominal hysterectomy    . Peripheral vascular catheterization N/A 04/02/2015    Procedure: Visceral Angiography;  Surgeon: Algernon Huxley, MD;  Location: [facility];  Service: Cardiovascular;  Laterality: N/A;  . Peripheral vascular catheterization N/A 04/02/2015    Procedure: Visceral Artery Intervention;  Surgeon: Algernon Huxley, MD;  Location: [facility];  Service: Cardiovascular;  Laterality: N/A;  . Colectomy with colostomy creation/hartmann procedure N/A 04/03/2015    Procedure: COLECTOMY WITH COLOSTOMY CREATION/HARTMANN PROCEDURE;  Surgeon: Sherri Rad, MD;  Location: [facility];  Service: General;  Laterality: N/A;  . Cardiac catheterization Right 04/03/2015    Procedure: CENTRAL LINE INSERTION;  Surgeon: Sherri Rad, MD;  Location: [facility];  Service: General;   Laterality: Right;    Family History  Problem Relation Age of Onset  . Heart attack Father   . Hypertension    . Arthritis-Osteo     Social History:  reports that she has never smoked. She does not have any smokeless tobacco history on file. She reports that she does not drink alcohol or use illicit drugs.  Allergies: No Known Allergies  Prior to Admission medications   Medication Sig Start Date End Date Taking? Authorizing Provider  Amino Acids-Protein Hydrolys (FEEDING SUPPLEMENT, PRO-STAT SUGAR FREE 64,) LIQD Take 30 mLs by mouth 2 (two) times daily.    Historical Provider, MD  ondansetron (ZOFRAN) 4 MG tablet Take 4 mg by mouth every 6 (six) hours as needed for nausea or vomiting.    Historical Provider, MD  oxycodone-acetaminophen (PERCOCET) 2.5-325 MG per tablet Take 1 tablet by mouth every 4 (four) hours as needed for pain. 04/08/15   Dustin Flock, MD  potassium chloride SA (K-DUR,KLOR-CON) 20 MEQ tablet Take 20 mEq by mouth daily.    Historical Provider, MD  Rivaroxaban (XARELTO STARTER PACK) 15 & 20 MG TBPK Take as directed on package: Start with one 42m tablet by mouth twice a day with food. On Day 22, switch to one 271mtablet once a day with food. 05/23/15   JeLauree ChandlerNP  UNABLE TO FIND Med Name: Med Pass 120 mL by mouth twice daily    Historical Provider, MD     Results for orders placed or performed during the hospital encounter of 05/24/15 (from the past 48  hour(s))  Basic metabolic panel     Status: Abnormal   Collection Time: 05/24/15  7:13 PM  Result Value Ref Range   Sodium 136 135 - 145 mmol/L   Potassium 4.1 3.5 - 5.1 mmol/L   Chloride 103 101 - 111 mmol/L   CO2 24 22 - 32 mmol/L   Glucose, Bld 103 (H) 65 - 99 mg/dL   BUN 10 6 - 20 mg/dL   Creatinine, Ser 0.49 0.44 - 1.00 mg/dL   Calcium 8.4 (L) 8.9 - 10.3 mg/dL   GFR calc non Af Amer >60 >60 mL/min   GFR calc Af Amer >60 >60 mL/min    Comment: (NOTE) The eGFR has been calculated using the CKD EPI  equation. This calculation has not been validated in all clinical situations. eGFR's persistently <60 mL/min signify possible Chronic Kidney Disease.    Anion gap 9 5 - 15  CBC     Status: Abnormal   Collection Time: 05/24/15  7:13 PM  Result Value Ref Range   WBC 11.0 3.6 - 11.0 K/uL   RBC 4.64 3.80 - 5.20 MIL/uL   Hemoglobin 10.6 (L) 12.0 - 16.0 g/dL    Comment: RESULT REPEATED AND VERIFIED   HCT 35.2 35.0 - 47.0 %   MCV 76.0 (L) 80.0 - 100.0 fL   MCH 23.0 (L) 26.0 - 34.0 pg   MCHC 30.2 (L) 32.0 - 36.0 g/dL   RDW 25.0 (H) 11.5 - 14.5 %   Platelets 520 (H) 150 - 440 K/uL  Urinalysis complete, with microscopic ([facility] only)     Status: Abnormal   Collection Time: 05/24/15  7:13 PM  Result Value Ref Range   Color, Urine YELLOW (A) YELLOW   APPearance HAZY (A) CLEAR   Glucose, UA NEGATIVE NEGATIVE mg/dL   Bilirubin Urine NEGATIVE NEGATIVE   Ketones, ur NEGATIVE NEGATIVE mg/dL   Specific Gravity, Urine 1.020 1.005 - 1.030   Hgb urine dipstick NEGATIVE NEGATIVE   pH 6.0 5.0 - 8.0   Protein, ur 30 (A) NEGATIVE mg/dL   Nitrite NEGATIVE NEGATIVE   Leukocytes, UA NEGATIVE NEGATIVE   RBC / HPF NONE SEEN 0 - 5 RBC/hpf   WBC, UA 0-5 0 - 5 WBC/hpf   Bacteria, UA RARE (A) NONE SEEN   Squamous Epithelial / LPF 6-30 (A) NONE SEEN   Mucous PRESENT    Budding Yeast PRESENT    Hyaline Casts, UA PRESENT   Glucose, capillary     Status: None   Collection Time: 05/24/15  8:11 PM  Result Value Ref Range   Glucose-Capillary 95 65 - 99 mg/dL  Troponin I     Status: None   Collection Time: 05/24/15 11:44 PM  Result Value Ref Range   Troponin I <0.03 <0.031 ng/mL    Comment:        NO INDICATION OF MYOCARDIAL INJURY.    Ct Angio Chest Pe W/cm &/or Wo Cm  05/24/2015   CLINICAL DATA:  Generalized weakness and inability to walk. Shortness of breath. History of DVTs.  EXAM: CT ANGIOGRAPHY CHEST WITH CONTRAST  TECHNIQUE: Multidetector CT imaging of the chest was performed using the standard  protocol during bolus administration of intravenous contrast. Multiplanar CT image reconstructions and MIPs were obtained to evaluate the vascular anatomy.  CONTRAST:  133m OMNIPAQUE IOHEXOL 350 MG/ML SOLN  COMPARISON:  02/19/2012  FINDINGS: Technically adequate study with good opacification of the central and segmental pulmonary arteries. Filling defects are demonstrated in segmental branch vessels  to the right lower lung consistent with focal pulmonary embolus. There is volume loss and infiltration in the right lower lung which may indicate pulmonary infarct. RT to LV ratio equals 0.98, representing upper limits of normal. Right heart is noted to be enlarged also on the previous study in this may represent typical pattern for this patient. Clinical correlation for possible right heart strain is suggested.  Normal caliber thoracic aorta. No aortic dissection. Great vessel origins are patent. Calcification in the aorta and Coronary arteries. Calcification in the mitral valve annulus. No significant lymphadenopathy in the chest. Esophagus is decompressed.  Atelectasis in the lung bases. No focal consolidation. No pneumothorax. No pleural effusions. Airways appear patent.  Included portions of the upper abdominal organs demonstrate a cyst in the upper pole right kidney, accessory spleen, apparent postoperative changes in the left upper quadrant mesenteric with some stranding or scarring which may be postoperative. Degenerative changes in the spine and shoulders. Thoracic scoliosis convex towards the right. No destructive bone lesions.  Calcified dominant nodule in the right lower pole thyroid measuring 2.4 cm diameter. No change since prior study.  Review of the MIP images confirms the above findings.  IMPRESSION: Positive for right lower lobe pulmonary emboli. Atelectasis or infarct in the right lung base. Additional incidental findings as above.  These results were called by telephone at the time of interpretation  on 05/24/2015 at 11:29 pm to Dr. Owens Shark, who verbally acknowledged these results.   Electronically Signed   By: Lucienne Capers M.D.   On: 05/24/2015 23:32    Review of Systems  Constitutional: Negative for fever and chills.  HENT: Negative for sore throat and tinnitus.   Eyes: Negative for blurred vision and redness.  Respiratory: Positive for shortness of breath. Negative for cough.   Cardiovascular: Negative for chest pain, palpitations, orthopnea and PND.  Gastrointestinal: Negative for nausea, vomiting, abdominal pain and diarrhea.  Genitourinary: Negative for dysuria, urgency and frequency.  Musculoskeletal: Negative for myalgias and joint pain.  Skin: Negative for rash.       No lesions  Neurological: Positive for weakness. Negative for speech change and focal weakness.  Endo/Heme/Allergies: Does not bruise/bleed easily.       No temperature intolerance  Psychiatric/Behavioral: Negative for depression and suicidal ideas.    Blood pressure 120/67, pulse 93, temperature 97.5 F (36.4 C), temperature source Oral, resp. rate 20, SpO2 100 %. Physical Exam  Nursing note and vitals reviewed. Constitutional: She is oriented to person, place, and time. She appears well-developed and well-nourished.  HENT:  Head: Normocephalic and atraumatic.  Mouth/Throat: Oropharynx is clear and moist.  Eyes: Conjunctivae and EOM are normal. Pupils are equal, round, and reactive to light. No scleral icterus.  Neck: Normal range of motion. Neck supple. No JVD present. No tracheal deviation present. No thyromegaly present.  Cardiovascular: Normal rate, regular rhythm and normal heart sounds.  Exam reveals no gallop and no friction rub.   No murmur heard. Respiratory: Effort normal and breath sounds normal.  GI: Soft. Bowel sounds are normal. She exhibits no distension. There is no tenderness.  Colostomy bag in place  Musculoskeletal: She exhibits edema (right leg> Left).  Right transmetatarsal  amputation  Lymphadenopathy:    She has no cervical adenopathy.  Neurological: She is alert and oriented to person, place, and time. No cranial nerve deficit. She exhibits normal muscle tone.  Slurred speech  Skin: Skin is warm and dry. No rash noted. No erythema.  Psychiatric: She has a  normal mood and affect. Her behavior is normal. Judgment and thought content normal.     Assessment/Plan This is an 81 year old African American female admitted for pulmonary embolism and cerebellar infarct. 1. 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 The patient was clearly dysarthric on my exam which was new to the emergency department staff as well as this examiner as I have admitted the patient to the hospital before. I ordered CT of the head showed likely cerebellar infarct. This explains her slurred speech but is more concerning for paradoxical embolism. I have ordered an echocardiogram as well as Doppler ultrasounds of her carotid arteries. Cardiology consult placed. MRI brain ordered. 2. Pulmonary embolism: Bilateral; patient has been re-started on Xarelto. (The patient had been on treatment for DVT previously). There is a concern that her medication may not have been administered appropriately prior to admission. I have restarted the initiation phase of rivaroxaban. The patient is not hypoxic. 3. Hypertension: Controlled at this time. Allow permissive hypertension in the setting of stroke. Resume home medications when list is verified. 4. DVT prophylaxis: Full dose oral anticoagulation as above 5. GI prophylaxis: None The patient is a full code. Time spent on admission orders and patient care approximately 45 minutes  Harrie Foreman 05/25/2015, 1:54 AM

## 2015-05-26 DIAGNOSIS — G458 Other transient cerebral ischemic attacks and related syndromes: Secondary | ICD-10-CM

## 2015-05-26 NOTE — Progress Notes (Signed)
[facility] NAME: Yolanda Wells    MR#:  [id number]  DATE OF BIRTH:  January 15, 1932  SUBJECTIVE:  CHIEF COMPLAINT:   Chief Complaint  Patient presents with  . Shortness of Breath  . Weakness   Found to have PE,  REVIEW OF SYSTEMS:  CONSTITUTIONAL: No fever, fatigue or weakness.  EYES: No blurred or double vision.  EARS, NOSE, AND THROAT: No tinnitus or ear pain.  RESPIRATORY: No cough, shortness of breath, wheezing or hemoptysis.  CARDIOVASCULAR: No chest pain, orthopnea, edema.  GASTROINTESTINAL: No nausea, vomiting, diarrhea or abdominal pain.  GENITOURINARY: No dysuria, hematuria.  ENDOCRINE: No polyuria, nocturia,  HEMATOLOGY: No anemia, easy bruising or bleeding SKIN: No rash or lesion. MUSCULOSKELETAL: No joint pain or arthritis.   NEUROLOGIC: No tingling, numbness, weakness.  PSYCHIATRY: No anxiety or depression.   ROS  DRUG ALLERGIES:  No Known Allergies  VITALS:  Blood pressure 123/65, pulse 91, temperature 98.2 F (36.8 C), temperature source Oral, resp. rate 19, height 5\' 7"  (1.702 m), weight 96.843 kg (213 lb 8 oz), SpO2 100 %.  PHYSICAL EXAMINATION:  GENERAL:  81 y.o.-year-old patient lying in the bed with no acute distress.  EYES: Pupils equal, round, reactive to light and accommodation. No scleral icterus. Extraocular muscles intact.  HEENT: Head atraumatic, normocephalic. Oropharynx and nasopharynx clear.  NECK:  Supple, no jugular venous distention. No thyroid enlargement, no tenderness.  LUNGS: Normal breath sounds bilaterally, no wheezing, rales,rhonchi or crepitation. No use of accessory muscles of respiration.  CARDIOVASCULAR: S1, S2 normal. No murmurs, rubs, or gallops.  ABDOMEN: Soft, nontender, nondistended. Bowel sounds present. No organomegaly or mass. Colostomy bag in place. EXTREMITIES: No pedal edema, cyanosis, or clubbing. Right foot trans metatarsal amputation present. NEUROLOGIC:  Cranial nerves II through XII are intact. Muscle strength 3/5 in lower extremities. Sensation intact. Gait not checked.  PSYCHIATRIC: The patient is alert and oriented x 3.  SKIN: No obvious rash, lesion, or ulcer.   Physical Exam LABORATORY PANEL:   CBC  Recent Labs Lab 05/24/15 1913  WBC 11.0  HGB 10.6*  HCT 35.2  PLT 520*   ------------------------------------------------------------------------------------------------------------------  Chemistries   Recent Labs Lab 05/24/15 1913  NA 136  K 4.1  CL 103  CO2 24  GLUCOSE 103*  BUN 10  CREATININE 0.49  CALCIUM 8.4*   ------------------------------------------------------------------------------------------------------------------  Cardiac Enzymes  Recent Labs Lab 05/24/15 2344  TROPONINI <0.03   ------------------------------------------------------------------------------------------------------------------  RADIOLOGY:  Ct Head Wo Contrast  05/25/2015   CLINICAL DATA:  Dysarthria. History of pulmonary embolism, polycythemia vera.  EXAM: CT HEAD WITHOUT CONTRAST  TECHNIQUE: Contiguous axial images were obtained from the base of the skull through the vertex without intravenous contrast.  COMPARISON:  None.  FINDINGS: The ventricles and sulci are normal for age ; dense probable choroid plexus extends to the foramen of Monro. Moderate disproportionate cerebellar and pontine volume loss. No intraparenchymal hemorrhage, mass effect nor midline shift. Patchy supratentorial white matter hypodensities are less than expected for patient's age and though non-specific suggest sequelae of chronic small vessel ischemic disease. No acute large vascular territory infarcts. 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  No abnormal extra-axial fluid collections. Mildly prominent LEFT frontal extra-axial space, nonspecific. Basal cisterns are patent. Mild calcific atherosclerosis of the carotid siphons.  No skull fracture. The  included ocular globes and orbital contents are non-suspicious. Bilateral ocular lens implants. The mastoid aircells and included paranasal sinuses are well-aerated.  IMPRESSION: No  acute intracranial process.  Probable choroid plexus extending to the foramen of Monro, unlikely to represent blood products though, if clinically indicated, recommend follow-up CT head in 6 hours to verify stability of findings.  Disproportionate cerebellar and pontine volume loss with small suspected RIGHT inferior cerebellar infarct.   Electronically Signed   By: Elon Alas M.D.   On: 05/25/2015 02:22   Ct Angio Chest Pe W/cm &/or Wo Cm  05/24/2015   CLINICAL DATA:  Generalized weakness and inability to walk. Shortness of breath. History of DVTs.  EXAM: CT ANGIOGRAPHY CHEST WITH CONTRAST  TECHNIQUE: Multidetector CT imaging of the chest was performed using the standard protocol during bolus administration of intravenous contrast. Multiplanar CT image reconstructions and MIPs were obtained to evaluate the vascular anatomy.  CONTRAST:  128mL OMNIPAQUE IOHEXOL 350 MG/ML SOLN  COMPARISON:  02/19/2012  FINDINGS: Technically adequate study with good opacification of the central and segmental pulmonary arteries. Filling defects are demonstrated in segmental branch vessels to the right lower lung consistent with focal pulmonary embolus. There is volume loss and infiltration in the right lower lung which may indicate pulmonary infarct. RT to LV ratio equals 0.98, representing upper limits of normal. Right heart is noted to be enlarged also on the previous study in this may represent typical pattern for this patient. Clinical correlation for possible right heart strain is suggested.  Normal caliber thoracic aorta. No aortic dissection. Great vessel origins are patent. Calcification in the aorta and Coronary arteries. Calcification in the mitral valve annulus. No significant lymphadenopathy in the chest. Esophagus is decompressed.   Atelectasis in the lung bases. No focal consolidation. No pneumothorax. No pleural effusions. Airways appear patent.  Included portions of the upper abdominal organs demonstrate a cyst in the upper pole right kidney, accessory spleen, apparent postoperative changes in the left upper quadrant mesenteric with some stranding or scarring which may be postoperative. Degenerative changes in the spine and shoulders. Thoracic scoliosis convex towards the right. No destructive bone lesions.  Calcified dominant nodule in the right lower pole thyroid measuring 2.4 cm diameter. No change since prior study.  Review of the MIP images confirms the above findings.  IMPRESSION: Positive for right lower lobe pulmonary emboli. Atelectasis or infarct in the right lung base. Additional incidental findings as above.  These results were called by telephone at the time of interpretation on 05/24/2015 at 11:29 pm to Dr. Owens Shark, who verbally acknowledged these results.   Electronically Signed   By: Lucienne Capers M.D.   On: 05/24/2015 23:32   Mr Brain Wo Contrast  05/25/2015   CLINICAL DATA:  Dizziness.  Slurred speech.  Unable walk.  CVA.  EXAM: MRI HEAD WITHOUT CONTRAST  TECHNIQUE: Multiplanar, multiecho pulse sequences of the brain and surrounding structures were obtained without intravenous contrast.  COMPARISON:  CT head without contrast 05/25/2015.  FINDINGS: Mild generalized atrophy is within normal limits for age. No significant white matter disease is present. The diffusion-weighted images demonstrate no acute infarct. No acute hemorrhage or mass lesion is present. The ventricles are proportionate to the degree of atrophy. No significant extra-axial fluid collection is present.  Abnormal signal in the right vertebral artery suggests lower likely occluded flow. Flow is present in the left vertebral artery in bilateral carotid arteries. Bilateral lens replacements are noted. The globes orbits are the intact. The paranasal sinuses  and mastoid air cells are clear.  Skullbase is within normal limits. Midline images demonstrate degenerative change in the upper cervical spine. No  focal intracranial abnormalities are evident.  IMPRESSION: 1. Moderate generalized atrophy and mild diffuse white matter disease is likely within normal limits for age. 2. Occluded right vertebral artery. The left vertebral artery and basilar artery are patent. 3. No acute intracranial abnormality.   Electronically Signed   By: San Morelle M.D.   On: 05/25/2015 17:02   US Carotid Bilateral  05/25/2015   CLINICAL DATA:  CVA.  History of hypertension and syncopal episode.  EXAM: BILATERAL CAROTID DUPLEX ULTRASOUND  TECHNIQUE: Pearline Cables scale imaging, color Doppler and duplex ultrasound were performed of bilateral carotid and vertebral arteries in the neck.  COMPARISON:  None.  FINDINGS: Criteria: Quantification of carotid stenosis is based on velocity parameters that correlate the residual internal carotid diameter with NASCET-based stenosis levels, using the diameter of the distal internal carotid lumen as the denominator for stenosis measurement.  The following velocity measurements were obtained:  RIGHT  ICA:  56/17 cm/sec  CCA:  40/81 cm/sec  SYSTOLIC ICA/CCA RATIO:  4.48  DIASTOLIC ICA/CCA RATIO:  1.1  ECA:  42 cm/sec  LEFT  ICA:  80/33 cm/sec  CCA:  18/56 cm/sec  SYSTOLIC ICA/CCA RATIO:  1.4  DIASTOLIC ICA/CCA RATIO:  2.5  ECA:  41 cm/sec  RIGHT CAROTID ARTERY: The right common carotid artery is noted to be mildly tortuous (representative image 2). There is a very minimal amount of eccentric mixed echogenic plaque within the right carotid bulb (image 11), extending to involve the origin and proximal aspects of the right internal carotid artery (image 17), not resulting in elevated peak systolic velocities within the interrogated course of the right internal carotid artery to suggest a hemodynamically significant stenosis.  RIGHT VERTEBRAL ARTERY:  Antegrade  flow  LEFT CAROTID ARTERY: There is a minimal amount of eccentric mixed echogenic plaque within the left carotid bulb (image 48), not resulting in elevated peak systolic velocities within the interrogated course of the left internal carotid artery to suggest a hemodynamically significant stenosis.  LEFT VERTEBRAL ARTERY:  Antegrade flow  IMPRESSION: Very minimal amount of bilateral atherosclerotic plaque, right subjectively greater than left, not resulting in a hemodynamically significant stenosis.   Electronically Signed   By: Sandi Mariscal M.D.   On: 05/25/2015 11:42    ASSESSMENT AND PLAN:   This is an 81 year old African American female admitted for pulmonary embolism and cerebellar infarct. 1. CVA: Infarct to the left cerebellum per CT head.  Echocardiogram,MRI, Doppler ultrasounds of her carotid arteries. Cardiology consult placed. MRI brain ordered.neuro consult 2. Pulmonary embolism: Bilateral; patient has been re-started on Xarelto. (The patient had been on treatment for DVT previously).  3. Hypertension: Controlled at this time. Allow permissive hypertension in the setting of stroke. Resume home medications when list is verified. 4. DVT prophylaxis: Full dose oral anticoagulation as above 5. GI prophylaxis: None   All the records are reviewed and case discussed with Care Management/Social Workerr. Management plans discussed with the patient, family and they are in agreement.  CODE STATUS: full  TOTAL TIME TAKING CARE OF THIS PATIENT: 35 minutes.   More than 50% of the visit was spent in counseling/coordination of care  POSSIBLE D/C IN 2-3 DAYS, DEPENDING ON CLINICAL CONDITION.   Vaughan Basta M.D on 05/26/2015   Between 7am to 6pm - Pager - [phone]  After 6pm go to [email or web address] - password [email or web address] [facility]  Office  [phone]  CC: Primary care physician; Pcp Not In System

## 2015-05-26 NOTE — Progress Notes (Signed)
PT Cancellation Note  Patient Details Name: Yolanda Wells MRN: [id number] DOB: 1932-10-18   Cancelled Treatment:    Reason Eval/Treat Not Completed: Patient not medically ready. Chart reviewed, RN consulted. Holding pt treatment at this time due to acute PE. Per hospital policy, will hold until 48hr p start of therapeutic dose of anticoagulation. Will attempt at later date/time.     Rocket Gunderson C 05/26/2015, 1:11 PM  1:13 PM  [name], PT, DPT [place] License # [id number]

## 2015-05-26 NOTE — Consult Note (Signed)
CC: generalized weakness   HPI: Yolanda Wells is an 81 y.o. female presents to the emergency department via EMS after being discharged from her rehabilitation facility today but being too weak to stand on her way into her own home as well as found to be SOB. Imaging positive for PE and pt was started on xarelto.  Pt seems very fatigued and difficulty following commands.  CTH showed ? Inf cerebellar infarct which appears to be incidental as MRI did not show acute abnormalities      Past Medical History  Diagnosis Date  . Hypertension   . Polycythemia vera(238.4)   . Deep venous thrombosis     right lower extremity  . History of hysterectomy   . History of bilateral hip replacements     Past Surgical History  Procedure Laterality Date  . Toe amputation      right  . Joint replacement      Left and Right Hip  . Abdominal hysterectomy    . Peripheral vascular catheterization N/A 04/02/2015    Procedure: Visceral Angiography;  Surgeon: Algernon Huxley, MD;  Location: [facility];  Service: Cardiovascular;  Laterality: N/A;  . Peripheral vascular catheterization N/A 04/02/2015    Procedure: Visceral Artery Intervention;  Surgeon: Algernon Huxley, MD;  Location: [facility];  Service: Cardiovascular;  Laterality: N/A;  . Colectomy with colostomy creation/hartmann procedure N/A 04/03/2015    Procedure: COLECTOMY WITH COLOSTOMY CREATION/HARTMANN PROCEDURE;  Surgeon: Sherri Rad, MD;  Location: [facility];  Service: General;  Laterality: N/A;  . Cardiac catheterization Right 04/03/2015    Procedure: CENTRAL LINE INSERTION;  Surgeon: Sherri Rad, MD;  Location: [facility];  Service: General;  Laterality: Right;    Family History  Problem Relation Age of Onset  . Heart attack Father   . Hypertension    . Arthritis-Osteo      Social History:  reports that she has never smoked. She does not have any smokeless tobacco history on file. She reports that she does not drink alcohol or use  illicit drugs.  No Known Allergies  Medications: I have reviewed the patient's current medications.  ROS: Unable to obtain due to fatigue   Physical Examination: Blood pressure 114/61, pulse 91, temperature 99.3 F (37.4 C), temperature source Oral, resp. rate 23, height 5\' 7"  (1.702 m), weight 96.843 kg (213 lb 8 oz), SpO2 100 %.   Neurological Examination Mental Status: Dysarthric speech with generalized weakness.  Cranial Nerves: II: Discs flat bilaterally; Visual fields grossly normal III,IV, VI: ptosis not present, extra-ocular motions intact bilaterally V,VII: smile symmetric, facial light touch sensation normal bilaterally VIII: hearing normal bilaterally IX,X: gag reflex present XI: bilateral shoulder shrug XII: midline tongue extension Motor: Right : Upper extremity   4/5    Left:     Upper extremity   4/5  Lower extremity   4/5     Lower extremity   4/5 Tone and bulk:normal tone throughout; no atrophy noted Sensory: Pinprick and light touch intact throughout, bilaterally Deep Tendon Reflexes: 1+ and symmetric throughout Plantars: Right: downgoing   Left: downgoing Cerebellar: Not tested  Gait: not tested       Laboratory Studies:   Basic Metabolic Panel:  Recent Labs Lab 05/24/15 1913  NA 136  K 4.1  CL 103  CO2 24  GLUCOSE 103*  BUN 10  CREATININE 0.49  CALCIUM 8.4*    Liver Function Tests: No results for input(s): AST, ALT, ALKPHOS, BILITOT,  PROT, ALBUMIN in the last 168 hours. No results for input(s): LIPASE, AMYLASE in the last 168 hours. No results for input(s): AMMONIA in the last 168 hours.  CBC:  Recent Labs Lab 05/24/15 1913  WBC 11.0  HGB 10.6*  HCT 35.2  MCV 76.0*  PLT 520*    Cardiac Enzymes:  Recent Labs Lab 05/24/15 2344  TROPONINI <0.03    BNP: Invalid input(s): POCBNP  CBG:  Recent Labs Lab 05/24/15 2011  [place] 95    Microbiology: Results for orders placed or performed during the hospital encounter  of 04/19/15  Blood Culture (routine x 2)     Status: None   Collection Time: 04/19/15  3:00 PM  Result Value Ref Range Status   Specimen Description BLOOD  Final   Special Requests NONE  Final   Culture NO GROWTH 5 DAYS  Final   Report Status 04/24/2015 FINAL  Final  Blood Culture (routine x 2)     Status: None   Collection Time: 04/19/15  3:00 PM  Result Value Ref Range Status   Specimen Description BLOOD  Final   Special Requests NONE  Final   Culture NO GROWTH 5 DAYS  Final   Report Status 04/24/2015 FINAL  Final  Urine culture     Status: None   Collection Time: 04/19/15  3:02 PM  Result Value Ref Range Status   Specimen Description URINE, RANDOM  Final   Special Requests NONE  Final   Culture NO GROWTH 2 DAYS  Final   Report Status 04/21/2015 FINAL  Final    Coagulation Studies: No results for input(s): LABPROT, INR in the last 72 hours.  Urinalysis:   Recent Labs Lab 05/24/15 1913  COLORURINE YELLOW*  LABSPEC 1.020  PHURINE 6.0  GLUCOSEU NEGATIVE  HGBUR NEGATIVE  BILIRUBINUR NEGATIVE  KETONESUR NEGATIVE  PROTEINUR 30*  NITRITE NEGATIVE  LEUKOCYTESUR NEGATIVE    Lipid Panel:  No results found for: CHOL, TRIG, HDL, CHOLHDL, VLDL, LDLCALC  HgbA1C:  Lab Results  Component Value Date   HGBA1C 5.1 05/24/2015    Urine Drug Screen:  No results found for: LABOPIA, COCAINSCRNUR, LABBENZ, AMPHETMU, THCU, LABBARB  Alcohol Level: No results for input(s): ETH in the last 168 hours.   Imaging: Ct Head Wo Contrast  05/25/2015   CLINICAL DATA:  Dysarthria. History of pulmonary embolism, polycythemia vera.  EXAM: CT HEAD WITHOUT CONTRAST  TECHNIQUE: Contiguous axial images were obtained from the base of the skull through the vertex without intravenous contrast.  COMPARISON:  None.  FINDINGS: The ventricles and sulci are normal for age ; dense probable choroid plexus extends to the foramen of [place]. Moderate disproportionate cerebellar and pontine volume loss. No  intraparenchymal hemorrhage, mass effect nor midline shift. Patchy supratentorial white matter hypodensities are less than expected for patient's age and though non-specific suggest sequelae of chronic small vessel ischemic disease. No acute large vascular territory infarcts. 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  No abnormal extra-axial fluid collections. Mildly prominent LEFT frontal extra-axial space, nonspecific. Basal cisterns are patent. Mild calcific atherosclerosis of the carotid siphons.  No skull fracture. The included ocular globes and orbital contents are non-suspicious. Bilateral ocular lens implants. The mastoid aircells and included paranasal sinuses are well-aerated.  IMPRESSION: No acute intracranial process.  Probable choroid plexus extending to the foramen of [place]ro, unlikely to represent blood products though, if clinically indicated, recommend follow-up CT head in 6 hours to verify stability of findings.  Disproportionate cerebellar and pontine volume loss  with small suspected RIGHT inferior cerebellar infarct.   Electronically Signed   By: Elon Alas M.D.   On: 05/25/2015 02:22   Ct Angio Chest Pe W/cm &/or Wo Cm  05/24/2015   CLINICAL DATA:  Generalized weakness and inability to walk. Shortness of breath. History of DVTs.  EXAM: CT ANGIOGRAPHY CHEST WITH CONTRAST  TECHNIQUE: Multidetector CT imaging of the chest was performed using the standard protocol during bolus administration of intravenous contrast. Multiplanar CT image reconstructions and MIPs were obtained to evaluate the vascular anatomy.  CONTRAST:  142mL OMNIPAQUE IOHEXOL 350 MG/ML SOLN  COMPARISON:  02/19/2012  FINDINGS: Technically adequate study with good opacification of the central and segmental pulmonary arteries. Filling defects are demonstrated in segmental branch vessels to the right lower lung consistent with focal pulmonary embolus. There is volume loss and infiltration in the right lower lung which  may indicate pulmonary infarct. RT to LV ratio equals 0.98, representing upper limits of normal. Right heart is noted to be enlarged also on the previous study in this may represent typical pattern for this patient. Clinical correlation for possible right heart strain is suggested.  Normal caliber thoracic aorta. No aortic dissection. Great vessel origins are patent. Calcification in the aorta and Coronary arteries. Calcification in the mitral valve annulus. No significant lymphadenopathy in the chest. Esophagus is decompressed.  Atelectasis in the lung bases. No focal consolidation. No pneumothorax. No pleural effusions. Airways appear patent.  Included portions of the upper abdominal organs demonstrate a cyst in the upper pole right kidney, accessory spleen, apparent postoperative changes in the left upper quadrant mesenteric with some stranding or scarring which may be postoperative. Degenerative changes in the spine and shoulders. Thoracic scoliosis convex towards the right. No destructive bone lesions.  Calcified dominant nodule in the right lower pole thyroid measuring 2.4 cm diameter. No change since prior study.  Review of the MIP images confirms the above findings.  IMPRESSION: Positive for right lower lobe pulmonary emboli. Atelectasis or infarct in the right lung base. Additional incidental findings as above.  These results were called by telephone at the time of interpretation on 05/24/2015 at 11:29 pm to Dr. Owens Shark, who verbally acknowledged these results.   Electronically Signed   By: Lucienne Capers M.D.   On: 05/24/2015 23:32   Mr Brain Wo Contrast  05/25/2015   CLINICAL DATA:  Dizziness.  Slurred speech.  Unable walk.  CVA.  EXAM: MRI HEAD WITHOUT CONTRAST  TECHNIQUE: Multiplanar, multiecho pulse sequences of the brain and surrounding structures were obtained without intravenous contrast.  COMPARISON:  CT head without contrast 05/25/2015.  FINDINGS: Mild generalized atrophy is within normal limits  for age. No significant white matter disease is present. The diffusion-weighted images demonstrate no acute infarct. No acute hemorrhage or mass lesion is present. The ventricles are proportionate to the degree of atrophy. No significant extra-axial fluid collection is present.  Abnormal signal in the right vertebral artery suggests lower likely occluded flow. Flow is present in the left vertebral artery in bilateral carotid arteries. Bilateral lens replacements are noted. The globes orbits are the intact. The paranasal sinuses and mastoid air cells are clear.  Skullbase is within normal limits. Midline images demonstrate degenerative change in the upper cervical spine. No focal intracranial abnormalities are evident.  IMPRESSION: 1. Moderate generalized atrophy and mild diffuse white matter disease is likely within normal limits for age. 2. Occluded right vertebral artery. The left vertebral artery and basilar artery are patent. 3. No acute  intracranial abnormality.   Electronically Signed   By: San Morelle M.D.   On: 05/25/2015 17:02   US Carotid Bilateral  05/25/2015   CLINICAL DATA:  CVA.  History of hypertension and syncopal episode.  EXAM: BILATERAL CAROTID DUPLEX ULTRASOUND  TECHNIQUE: Pearline Cables scale imaging, color Doppler and duplex ultrasound were performed of bilateral carotid and vertebral arteries in the neck.  COMPARISON:  None.  FINDINGS: Criteria: Quantification of carotid stenosis is based on velocity parameters that correlate the residual internal carotid diameter with NASCET-based stenosis levels, using the diameter of the distal internal carotid lumen as the denominator for stenosis measurement.  The following velocity measurements were obtained:  RIGHT  ICA:  56/17 cm/sec  CCA:  13/08 cm/sec  SYSTOLIC ICA/CCA RATIO:  6.57  DIASTOLIC ICA/CCA RATIO:  1.1  ECA:  42 cm/sec  LEFT  ICA:  80/33 cm/sec  CCA:  84/69 cm/sec  SYSTOLIC ICA/CCA RATIO:  1.4  DIASTOLIC ICA/CCA RATIO:  2.5  ECA:  41  cm/sec  RIGHT CAROTID ARTERY: The right common carotid artery is noted to be mildly tortuous (representative image 2). There is a very minimal amount of eccentric mixed echogenic plaque within the right carotid bulb (image 11), extending to involve the origin and proximal aspects of the right internal carotid artery (image 17), not resulting in elevated peak systolic velocities within the interrogated course of the right internal carotid artery to suggest a hemodynamically significant stenosis.  RIGHT VERTEBRAL ARTERY:  Antegrade flow  LEFT CAROTID ARTERY: There is a minimal amount of eccentric mixed echogenic plaque within the left carotid bulb (image 48), not resulting in elevated peak systolic velocities within the interrogated course of the left internal carotid artery to suggest a hemodynamically significant stenosis.  LEFT VERTEBRAL ARTERY:  Antegrade flow  IMPRESSION: Very minimal amount of bilateral atherosclerotic plaque, right subjectively greater than left, not resulting in a hemodynamically significant stenosis.   Electronically Signed   By: Sandi Mariscal M.D.   On: 05/25/2015 11:42     Assessment/Plan:  81 y.o. female presents to the emergency department via EMS after being discharged from her rehabilitation facility today but being too weak to stand on her way into her own home as well as found to be SOB. Imaging positive for PE and pt was started on xarelto.  Pt seems very fatigued and difficulty following commands.  CTH showed ? Inf cerebellar infarct.   81 y.o. female presents to the emergency department via EMS after being discharged from her rehabilitation facility today but being too weak to stand on her way into her own home as well as found to be SOB. Imaging positive for PE and pt was started on xarelto.  Pt seems very fatigued and difficulty following commands.  CTH showed ? Inf cerebellar infarct which was an artifact, but MRI brain no acute abnormality.    - con't xarelot - SOB is  improving.   - s/p discussion with grand daughter at bedside.    Leotis Pain   05/26/2015, 1:19 PM

## 2015-05-26 NOTE — Consult Note (Signed)
[facility] Consultation Note  Patient ID: Yolanda Wells, MRN: [id number], DOB/AGE: [birth year] 79 y.o. Admit date: 05/24/2015   Date of Consult: 05/26/2015 Primary Physician: Pcp Not In System Primary Cardiologist: None  Chief Complaint:  Chief Complaint  Patient presents with  . Shortness of Breath  . Weakness   Reason for Consult: acute cerebrovascular accident and pulmonary embolism with hypertension  HPI: 81 y.o. female with known essential hypertension and vascular disease who has had a significant new onset of shortness of breath hypoxia well as a cerebrovascular accident. The patient has had a chest CT showing pulmonary embolism which has been treated with anticoagulation as well as with an IVC filter. The patient has had difficulty with this cerebrovascular accident with speech issues but it is relatively stable at this time. The patient has had no evidence of cardiac source of cerebrovascular accident with current telemetry showing normal sinus rhythm. Additionally there is been no evidence of myocardial infarction LV systolic dysfunction and/or congestive heart failure. Has had an elevated troponin of 0.03 most consistent with current illness rather than acute coronary syndrome. The patient does have anemia which may play a role in the near future as continued anticoagulation. Currently there is no angina heart failure and/or rhythm disturbances needing further intervention today  Past Medical History  Diagnosis Date  . Hypertension   . Polycythemia vera(238.4)   . Deep venous thrombosis     right lower extremity  . History of hysterectomy   . History of bilateral hip replacements       Surgical History:  Past Surgical History  Procedure Laterality Date  . Toe amputation      right  . Joint replacement      Left and Right Hip  . Abdominal hysterectomy    . Peripheral vascular catheterization N/A 04/02/2015    Procedure: Visceral Angiography;  Surgeon: Algernon Huxley, MD;  Location: [place] [facility];  Service: Cardiovascular;  Laterality: N/A;  . Peripheral vascular catheterization N/A 04/02/2015    Procedure: Visceral Artery Intervention;  Surgeon: Algernon Huxley, MD;  Location: [place] [facility];  Service: Cardiovascular;  Laterality: N/A;  . Colectomy with colostomy creation/hartmann procedure N/A 04/03/2015    Procedure: COLECTOMY WITH COLOSTOMY CREATION/HARTMANN PROCEDURE;  Surgeon: Sherri Rad, MD;  Location: [facility];  Service: General;  Laterality: N/A;  . Cardiac catheterization Right 04/03/2015    Procedure: CENTRAL LINE INSERTION;  Surgeon: Sherri Rad, MD;  Location: [facility];  Service: General;  Laterality: Right;     Home Meds: Prior to Admission medications   Medication Sig Start Date End Date Taking? Authorizing Provider  Amino Acids-Protein Hydrolys (FEEDING SUPPLEMENT, PRO-STAT SUGAR FREE 64,) LIQD Take 30 mLs by mouth 2 (two) times daily.   Yes Historical Provider, MD  potassium chloride SA (K-DUR,KLOR-CON) 20 MEQ tablet Take 20 mEq by mouth daily.   Yes Historical Provider, MD  Rivaroxaban (XARELTO STARTER PACK) 15 & 20 MG TBPK Take as directed on package: Start with one 15mg  tablet by mouth twice a day with food. On Day 22, switch to one 20mg  tablet once a day with food. 05/23/15  Yes Lauree Chandler, NP  ondansetron (ZOFRAN) 4 MG tablet Take 4 mg by mouth every 6 (six) hours as needed for nausea or vomiting.    Historical Provider, MD  oxycodone-acetaminophen (PERCOCET) 2.5-325 MG per tablet Take 1 tablet by mouth every 4 (four) hours as needed for pain. Patient not taking: Reported on 05/25/2015 04/08/15  Dustin Flock, MD  UNABLE TO FIND Med Name: Med Pass 120 mL by mouth twice daily    Historical Provider, MD    Inpatient Medications:  .  ceFAZolin (ANCEF) IV  1 g Intravenous On Call  . docusate sodium  100 mg Oral BID  . feeding supplement (PRO-STAT SUGAR FREE 64)  30 mL Oral BID  . potassium chloride SA  20 mEq Oral Daily   . Rivaroxaban  15 mg Oral BID WC  . sodium chloride  3 mL Intravenous Q12H      Allergies: No Known Allergies  History   Social History  . Marital Status: Widowed    Spouse Name: N/A  . Number of Children: N/A  . Years of Education: N/A   Occupational History  . Not on file.   Social History Main Topics  . Smoking status: Never Smoker   . Smokeless tobacco: Not on file  . Alcohol Use: No  . Drug Use: No  . Sexual Activity: Not on file   Other Topics Concern  . Not on file   Social History Narrative   Lives at [facility]     Family History  Problem Relation Age of Onset  . Heart attack Father   . Hypertension    . Arthritis-Osteo       Review of Systems Positive for cerebrovascular accident and shortness of breath Negative for: General:  chills, fever, night sweats or weight changes.  Cardiovascular: PND orthopnea syncope dizziness  Dermatological skin lesions rashes Respiratory: Cough congestion Urologic: Frequent urination urination at night and hematuria Abdominal: negative for nausea, vomiting, diarrhea, bright red blood per rectum, melena, or hematemesis Neurologic: negative for visual changes, and/or hearing changes  All other systems reviewed and are otherwise negative except as noted above.  Labs:  Recent Labs  05/24/15 2344  TROPONINI <0.03   Lab Results  Component Value Date   WBC 11.0 05/24/2015   HGB 10.6* 05/24/2015   HCT 35.2 05/24/2015   MCV 76.0* 05/24/2015   PLT 520* 05/24/2015    Recent Labs Lab 05/24/15 1913  NA 136  K 4.1  CL 103  CO2 24  BUN 10  CREATININE 0.49  CALCIUM 8.4*  GLUCOSE 103*   No results found for: CHOL, HDL, LDLCALC, TRIG No results found for: DDIMER  Radiology/Studies:  Ct Head Wo Contrast  05/25/2015   CLINICAL DATA:  Dysarthria. History of pulmonary embolism, polycythemia vera.  EXAM: CT HEAD WITHOUT CONTRAST  TECHNIQUE: Contiguous axial images were obtained from the base of the skull through  the vertex without intravenous contrast.  COMPARISON:  None.  FINDINGS: The ventricles and sulci are normal for age ; dense probable choroid plexus extends to the foramen of Monro. Moderate disproportionate cerebellar and pontine volume loss. No intraparenchymal hemorrhage, mass effect nor midline shift. Patchy supratentorial white matter hypodensities are less than expected for patient's age and though non-specific suggest sequelae of chronic small vessel ischemic disease. No acute large vascular territory infarcts. 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  No abnormal extra-axial fluid collections. Mildly prominent LEFT frontal extra-axial space, nonspecific. Basal cisterns are patent. Mild calcific atherosclerosis of the carotid siphons.  No skull fracture. The included ocular globes and orbital contents are non-suspicious. Bilateral ocular lens implants. The mastoid aircells and included paranasal sinuses are well-aerated.  IMPRESSION: No acute intracranial process.  Probable choroid plexus extending to the foramen of Monro, unlikely to represent blood products though, if clinically indicated, recommend follow-up CT head in  6 hours to verify stability of findings.  Disproportionate cerebellar and pontine volume loss with small suspected RIGHT inferior cerebellar infarct.   Electronically Signed   By: Elon Alas M.D.   On: 05/25/2015 02:22   Ct Angio Chest Pe W/cm &/or Wo Cm  05/24/2015   CLINICAL DATA:  Generalized weakness and inability to walk. Shortness of breath. History of DVTs.  EXAM: CT ANGIOGRAPHY CHEST WITH CONTRAST  TECHNIQUE: Multidetector CT imaging of the chest was performed using the standard protocol during bolus administration of intravenous contrast. Multiplanar CT image reconstructions and MIPs were obtained to evaluate the vascular anatomy.  CONTRAST:  178mL OMNIPAQUE IOHEXOL 350 MG/ML SOLN  COMPARISON:  02/19/2012  FINDINGS: Technically adequate study with good opacification  of the central and segmental pulmonary arteries. Filling defects are demonstrated in segmental branch vessels to the right lower lung consistent with focal pulmonary embolus. There is volume loss and infiltration in the right lower lung which may indicate pulmonary infarct. RT to LV ratio equals 0.98, representing upper limits of normal. Right heart is noted to be enlarged also on the previous study in this may represent typical pattern for this patient. Clinical correlation for possible right heart strain is suggested.  Normal caliber thoracic aorta. No aortic dissection. Great vessel origins are patent. Calcification in the aorta and Coronary arteries. Calcification in the mitral valve annulus. No significant lymphadenopathy in the chest. Esophagus is decompressed.  Atelectasis in the lung bases. No focal consolidation. No pneumothorax. No pleural effusions. Airways appear patent.  Included portions of the upper abdominal organs demonstrate a cyst in the upper pole right kidney, accessory spleen, apparent postoperative changes in the left upper quadrant mesenteric with some stranding or scarring which may be postoperative. Degenerative changes in the spine and shoulders. Thoracic scoliosis convex towards the right. No destructive bone lesions.  Calcified dominant nodule in the right lower pole thyroid measuring 2.4 cm diameter. No change since prior study.  Review of the MIP images confirms the above findings.  IMPRESSION: Positive for right lower lobe pulmonary emboli. Atelectasis or infarct in the right lung base. Additional incidental findings as above.  These results were called by telephone at the time of interpretation on 05/24/2015 at 11:29 pm to Dr. Owens Shark, who verbally acknowledged these results.   Electronically Signed   By: Lucienne Capers M.D.   On: 05/24/2015 23:32   Mr Brain Wo Contrast  05/25/2015   CLINICAL DATA:  Dizziness.  Slurred speech.  Unable walk.  CVA.  EXAM: MRI HEAD WITHOUT CONTRAST   TECHNIQUE: Multiplanar, multiecho pulse sequences of the brain and surrounding structures were obtained without intravenous contrast.  COMPARISON:  CT head without contrast 05/25/2015.  FINDINGS: Mild generalized atrophy is within normal limits for age. No significant white matter disease is present. The diffusion-weighted images demonstrate no acute infarct. No acute hemorrhage or mass lesion is present. The ventricles are proportionate to the degree of atrophy. No significant extra-axial fluid collection is present.  Abnormal signal in the right vertebral artery suggests lower likely occluded flow. Flow is present in the left vertebral artery in bilateral carotid arteries. Bilateral lens replacements are noted. The globes orbits are the intact. The paranasal sinuses and mastoid air cells are clear.  Skullbase is within normal limits. Midline images demonstrate degenerative change in the upper cervical spine. No focal intracranial abnormalities are evident.  IMPRESSION: 1. Moderate generalized atrophy and mild diffuse white matter disease is likely within normal limits for age. 2. Occluded right  vertebral artery. The left vertebral artery and basilar artery are patent. 3. No acute intracranial abnormality.   Electronically Signed   By: San Morelle M.D.   On: 05/25/2015 17:02   US Carotid Bilateral  05/25/2015   CLINICAL DATA:  CVA.  History of hypertension and syncopal episode.  EXAM: BILATERAL CAROTID DUPLEX ULTRASOUND  TECHNIQUE: Pearline Cables scale imaging, color Doppler and duplex ultrasound were performed of bilateral carotid and vertebral arteries in the neck.  COMPARISON:  None.  FINDINGS: Criteria: Quantification of carotid stenosis is based on velocity parameters that correlate the residual internal carotid diameter with NASCET-based stenosis levels, using the diameter of the distal internal carotid lumen as the denominator for stenosis measurement.  The following velocity measurements were obtained:   RIGHT  ICA:  56/17 cm/sec  CCA:  81/01 cm/sec  SYSTOLIC ICA/CCA RATIO:  7.51  DIASTOLIC ICA/CCA RATIO:  1.1  ECA:  42 cm/sec  LEFT  ICA:  80/33 cm/sec  CCA:  02/58 cm/sec  SYSTOLIC ICA/CCA RATIO:  1.4  DIASTOLIC ICA/CCA RATIO:  2.5  ECA:  41 cm/sec  RIGHT CAROTID ARTERY: The right common carotid artery is noted to be mildly tortuous (representative image 2). There is a very minimal amount of eccentric mixed echogenic plaque within the right carotid bulb (image 11), extending to involve the origin and proximal aspects of the right internal carotid artery (image 17), not resulting in elevated peak systolic velocities within the interrogated course of the right internal carotid artery to suggest a hemodynamically significant stenosis.  RIGHT VERTEBRAL ARTERY:  Antegrade flow  LEFT CAROTID ARTERY: There is a minimal amount of eccentric mixed echogenic plaque within the left carotid bulb (image 48), not resulting in elevated peak systolic velocities within the interrogated course of the left internal carotid artery to suggest a hemodynamically significant stenosis.  LEFT VERTEBRAL ARTERY:  Antegrade flow  IMPRESSION: Very minimal amount of bilateral atherosclerotic plaque, right subjectively greater than left, not resulting in a hemodynamically significant stenosis.   Electronically Signed   By: Sandi Mariscal M.D.   On: 05/25/2015 11:42   US Venous Img Lower Bilateral  05/18/2015   CLINICAL DATA:  Bilateral leg pain and edema.  EXAM: BILATERAL LOWER EXTREMITY VENOUS DOPPLER ULTRASOUND  TECHNIQUE: Gray-scale sonography with graded compression, as well as color Doppler and duplex ultrasound were performed to evaluate the lower extremity deep venous systems from the level of the common femoral vein and including the common femoral, femoral, profunda femoral, popliteal and calf veins including the posterior tibial, peroneal and gastrocnemius veins when visible. The superficial great saphenous vein was also interrogated.  Spectral Doppler was utilized to evaluate flow at rest and with distal augmentation maneuvers in the common femoral, femoral and popliteal veins.  COMPARISON:  03/25/2015  FINDINGS: There is occlusive or nearly occlusive thrombus filling the bilateral lower extremity deep venous system, extending from the common femoral veins into the lower popliteal veins (and likely into the legs as the calf veins are not visible). Central propagation cannot be established on this study, but a waveform within the partially thrombosed right common femoral vein shows respiratory phasicity, consistent with IVC patency at least.  The hypoechoic left calf mass, subcutaneous or deep fascial, is again seen - 64 x 14 x 36 mm today. Although still sizable, the mass is more flattened and ovoid in appearance, and hematoma is again favored.  Critical Value/emergent results were called by telephone at the time of interpretation on 05/18/2015 at 9:41 pm to Dr.  Lavonia Drafts , who verbally acknowledged these results.  IMPRESSION: 1. Diffuse occlusive deep venous thrombosis in the bilateral lower extremities. 2. Hypoechoic mass in the left calf, 6 cm in maximal dimension, persists since May 2016. There has been some contraction, and resolving hematoma is again favored. Reportedly this mass is palpable, and clinical follow-up is recommended. If no resolution, MRI is recommended.   Electronically Signed   By: Monte Fantasia M.D.   On: 05/18/2015 21:45    EKG: Normal sinus rhythm  Weights: Filed Weights   05/25/15 0825 05/26/15 0325  Weight: 212 lb 3.2 oz (96.253 kg) 213 lb 8 oz (96.843 kg)     Physical Exam: Blood pressure 123/65, pulse 91, temperature 98.2 F (36.8 C), temperature source Oral, resp. rate 19, height 5\' 7"  (1.702 m), weight 213 lb 8 oz (96.843 kg), SpO2 100 %. Body mass index is 33.43 kg/(m^2). General: Well developed, well nourished, in no acute distress. Head eyes ears nose throat: Normocephalic, atraumatic, sclera  non-icteric, no xanthomas, nares are without discharge. No apparent thyromegaly and/or mass  Lungs: Normal respiratory effort.  Few wheezes, no rales, no rhonchi.  Heart: RRR with normal S1 S2. no murmur gallop, no rub, PMI is normal size and placement, carotid upstroke normal without bruit, jugular venous pressure is normal Abdomen: Soft, non-tender, non-distended with normoactive bowel sounds. No hepatomegaly. No rebound/guarding. No obvious abdominal masses. Abdominal aorta is normal size without bruit Extremities: Trace edema. no cyanosis, no clubbing, no ulcers  Peripheral : 2+ bilateral upper extremity pulses, 2+ bilateral femoral pulses, 2+ bilateral dorsal pedal pulse Neuro: Alert and oriented.  . Moves all extremities spontaneously. Musculoskeletal: Normal muscle tone without kyphosis Psych:  Responds to questions appropriately with a normal affect.    Assessment: 81 year old female with essential hypertension previous history of deep venous thrombosis with pulmonary embolism causing shortness of breath and minimal elevation of troponin consistent with demand ischemia rather than acute coronary syndrome and a recent cerebrovascular accident with anemia and no current evidence of cardiac embolic source  Plan: 1. Continue anticoagulation for event of pulmonary embolism 2. Consider anticoagulation versus anti-platelet medication management for stroke 3. Continue telemetry to assess for the possibility of cryptogenic atrial fibrillation as a source of embolic phenomenon 4. Treatment of her tension necessary 5. Further cardiac diagnostics necessary at this time  Signed, Corey Skains M.D. [facility] 05/26/2015, 9:05 AM

## 2015-05-26 NOTE — Progress Notes (Signed)
[facility] [place] NAME: Yolanda Wells    MR#:  [id number]  DATE OF BIRTH:  29-Dec-1931  SUBJECTIVE:  CHIEF COMPLAINT:   Chief Complaint  Patient presents with  . Shortness of Breath  . Weakness   Patient denies any symptoms. MRI of the brain negative for stroke. Very weak REVIEW OF SYSTEMS:  CONSTITUTIONAL:  fever, positive fatigue and weakness.  EYES: No blurred or double vision.  EARS, NOSE, AND THROAT: No tinnitus or ear pain.  RESPIRATORY: No cough, positive shortness of breath, wheezing or hemoptysis.  CARDIOVASCULAR: No chest pain, orthopnea, edema.  GASTROINTESTINAL: No nausea, vomiting, diarrhea or abdominal pain.  GENITOURINARY: No dysuria, hematuria.  ENDOCRINE: No polyuria, nocturia,  HEMATOLOGY: No anemia, easy bruising or bleeding SKIN: No rash or lesion. MUSCULOSKELETAL: No joint pain or arthritis.   NEUROLOGIC: No tingling, numbness, weakness.  PSYCHIATRY: No anxiety or depression.   ROS  DRUG ALLERGIES:  No Known Allergies  VITALS:  Blood pressure 123/65, pulse 91, temperature 98.2 F (36.8 C), temperature source Oral, resp. rate 19, height 5\' 7"  (1.702 m), weight 96.843 kg (213 lb 8 oz), SpO2 100 %.  PHYSICAL EXAMINATION:  GENERAL:  81 y.o.-year-old patient lying in the bed with no acute distress.  EYES: Pupils equal, round, reactive to light and accommodation. No scleral icterus. Extraocular muscles intact.  HEENT: Head atraumatic, normocephalic. Oropharynx and nasopharynx clear.  NECK:  Supple, no jugular venous distention. No thyroid enlargement, no tenderness.  LUNGS: Normal breath sounds bilaterally, no wheezing, rales,rhonchi or crepitation. No use of accessory muscles of respiration.  CARDIOVASCULAR: S1, S2 normal. No murmurs, rubs, or gallops.  ABDOMEN: Soft, nontender, nondistended. Bowel sounds present. No organomegaly or mass. Colostomy bag in place. EXTREMITIES: No pedal edema, cyanosis,  or clubbing. Right foot trans metatarsal amputation present. NEUROLOGIC: Cranial nerves II through XII are intact. Muscle strength 3/5 in lower extremities. Sensation intact. Gait not checked.  PSYCHIATRIC: The patient is alert and oriented x 3.  SKIN: No obvious rash, lesion, or ulcer.   Physical Exam LABORATORY PANEL:   CBC  Recent Labs Lab 05/24/15 1913  WBC 11.0  HGB 10.6*  HCT 35.2  PLT 520*   ------------------------------------------------------------------------------------------------------------------  Chemistries   Recent Labs Lab 05/24/15 1913  NA 136  K 4.1  CL 103  CO2 24  GLUCOSE 103*  BUN 10  CREATININE 0.49  CALCIUM 8.4*   ------------------------------------------------------------------------------------------------------------------  Cardiac Enzymes  Recent Labs Lab 05/24/15 2344  TROPONINI <0.03   ------------------------------------------------------------------------------------------------------------------  RADIOLOGY:  Ct Head Wo Contrast  05/25/2015   CLINICAL DATA:  Dysarthria. History of pulmonary embolism, polycythemia vera.  EXAM: CT HEAD WITHOUT CONTRAST  TECHNIQUE: Contiguous axial images were obtained from the base of the skull through the vertex without intravenous contrast.  COMPARISON:  None.  FINDINGS: The ventricles and sulci are normal for age ; dense probable choroid plexus extends to the foramen of Monro. Moderate disproportionate cerebellar and pontine volume loss. No intraparenchymal hemorrhage, mass effect nor midline shift. Patchy supratentorial white matter hypodensities are less than expected for patient's age and though non-specific suggest sequelae of chronic small vessel ischemic disease. No acute large vascular territory infarcts. 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  No abnormal extra-axial fluid collections. Mildly prominent LEFT frontal extra-axial space, nonspecific. Basal cisterns are patent. Mild  calcific atherosclerosis of the carotid siphons.  No skull fracture. The included ocular globes and orbital contents are non-suspicious. Bilateral ocular lens implants. The mastoid  aircells and included paranasal sinuses are well-aerated.  IMPRESSION: No acute intracranial process.  Probable choroid plexus extending to the foramen of Monro, unlikely to represent blood products though, if clinically indicated, recommend follow-up CT head in 6 hours to verify stability of findings.  Disproportionate cerebellar and pontine volume loss with small suspected RIGHT inferior cerebellar infarct.   Electronically Signed   By: Elon Alas M.D.   On: 05/25/2015 02:22   Ct Angio Chest Pe W/cm &/or Wo Cm  05/24/2015   CLINICAL DATA:  Generalized weakness and inability to walk. Shortness of breath. History of DVTs.  EXAM: CT ANGIOGRAPHY CHEST WITH CONTRAST  TECHNIQUE: Multidetector CT imaging of the chest was performed using the standard protocol during bolus administration of intravenous contrast. Multiplanar CT image reconstructions and MIPs were obtained to evaluate the vascular anatomy.  CONTRAST:  150mL OMNIPAQUE IOHEXOL 350 MG/ML SOLN  COMPARISON:  02/19/2012  FINDINGS: Technically adequate study with good opacification of the central and segmental pulmonary arteries. Filling defects are demonstrated in segmental branch vessels to the right lower lung consistent with focal pulmonary embolus. There is volume loss and infiltration in the right lower lung which may indicate pulmonary infarct. RT to LV ratio equals 0.98, representing upper limits of normal. Right heart is noted to be enlarged also on the previous study in this may represent typical pattern for this patient. Clinical correlation for possible right heart strain is suggested.  Normal caliber thoracic aorta. No aortic dissection. Great vessel origins are patent. Calcification in the aorta and Coronary arteries. Calcification in the mitral valve annulus. No  significant lymphadenopathy in the chest. Esophagus is decompressed.  Atelectasis in the lung bases. No focal consolidation. No pneumothorax. No pleural effusions. Airways appear patent.  Included portions of the upper abdominal organs demonstrate a cyst in the upper pole right kidney, accessory spleen, apparent postoperative changes in the left upper quadrant mesenteric with some stranding or scarring which may be postoperative. Degenerative changes in the spine and shoulders. Thoracic scoliosis convex towards the right. No destructive bone lesions.  Calcified dominant nodule in the right lower pole thyroid measuring 2.4 cm diameter. No change since prior study.  Review of the MIP images confirms the above findings.  IMPRESSION: Positive for right lower lobe pulmonary emboli. Atelectasis or infarct in the right lung base. Additional incidental findings as above.  These results were called by telephone at the time of interpretation on 05/24/2015 at 11:29 pm to Dr. Owens Shark, who verbally acknowledged these results.   Electronically Signed   By: Lucienne Capers M.D.   On: 05/24/2015 23:32   Mr Brain Wo Contrast  05/25/2015   CLINICAL DATA:  Dizziness.  Slurred speech.  Unable walk.  CVA.  EXAM: MRI HEAD WITHOUT CONTRAST  TECHNIQUE: Multiplanar, multiecho pulse sequences of the brain and surrounding structures were obtained without intravenous contrast.  COMPARISON:  CT head without contrast 05/25/2015.  FINDINGS: Mild generalized atrophy is within normal limits for age. No significant white matter disease is present. The diffusion-weighted images demonstrate no acute infarct. No acute hemorrhage or mass lesion is present. The ventricles are proportionate to the degree of atrophy. No significant extra-axial fluid collection is present.  Abnormal signal in the right vertebral artery suggests lower likely occluded flow. Flow is present in the left vertebral artery in bilateral carotid arteries. Bilateral lens  replacements are noted. The globes orbits are the intact. The paranasal sinuses and mastoid air cells are clear.  Skullbase is within normal limits. Midline  images demonstrate degenerative change in the upper cervical spine. No focal intracranial abnormalities are evident.  IMPRESSION: 1. Moderate generalized atrophy and mild diffuse white matter disease is likely within normal limits for age. 2. Occluded right vertebral artery. The left vertebral artery and basilar artery are patent. 3. No acute intracranial abnormality.   Electronically Signed   By: San Morelle M.D.   On: 05/25/2015 17:02   US Carotid Bilateral  05/25/2015   CLINICAL DATA:  CVA.  History of hypertension and syncopal episode.  EXAM: BILATERAL CAROTID DUPLEX ULTRASOUND  TECHNIQUE: Pearline Cables scale imaging, color Doppler and duplex ultrasound were performed of bilateral carotid and vertebral arteries in the neck.  COMPARISON:  None.  FINDINGS: Criteria: Quantification of carotid stenosis is based on velocity parameters that correlate the residual internal carotid diameter with NASCET-based stenosis levels, using the diameter of the distal internal carotid lumen as the denominator for stenosis measurement.  The following velocity measurements were obtained:  RIGHT  ICA:  56/17 cm/sec  CCA:  40/97 cm/sec  SYSTOLIC ICA/CCA RATIO:  3.53  DIASTOLIC ICA/CCA RATIO:  1.1  ECA:  42 cm/sec  LEFT  ICA:  80/33 cm/sec  CCA:  29/92 cm/sec  SYSTOLIC ICA/CCA RATIO:  1.4  DIASTOLIC ICA/CCA RATIO:  2.5  ECA:  41 cm/sec  RIGHT CAROTID ARTERY: The right common carotid artery is noted to be mildly tortuous (representative image 2). There is a very minimal amount of eccentric mixed echogenic plaque within the right carotid bulb (image 11), extending to involve the origin and proximal aspects of the right internal carotid artery (image 17), not resulting in elevated peak systolic velocities within the interrogated course of the right internal carotid artery to  suggest a hemodynamically significant stenosis.  RIGHT VERTEBRAL ARTERY:  Antegrade flow  LEFT CAROTID ARTERY: There is a minimal amount of eccentric mixed echogenic plaque within the left carotid bulb (image 48), not resulting in elevated peak systolic velocities within the interrogated course of the left internal carotid artery to suggest a hemodynamically significant stenosis.  LEFT VERTEBRAL ARTERY:  Antegrade flow  IMPRESSION: Very minimal amount of bilateral atherosclerotic plaque, right subjectively greater than left, not resulting in a hemodynamically significant stenosis.   Electronically Signed   By: Sandi Mariscal M.D.   On: 05/25/2015 11:42    ASSESSMENT AND PLAN:   This is an 81 year old African American female admitted for pulmonary embolism and cerebellar infarct. 1.speech changes appears to be chronic MRI of the brain negative for acute CVA Echocardiogram,MRI, Doppler ultrasounds of her carotid arteries. Cardiology consult placed. MRI brain ordered.neuro consult 2. Pulmonary embolism: Bilateral; patient has been re-started on Xarelto. (The patient had been on treatment for DVT previously). She is status post IVC filter in light of her having a massive GI bleed recently requiring hemicolectomy her hemoglobin will closely be need to be monitored if he starts dropping and the Xarelto will need to be stopped 3. Hypertension: Controlled at this time 4.  Polycythemia vera 5. GI prophylaxis: None   All the records are reviewed and case discussed with Care Management/Social Workerr. Management plans discussed with the patient, family and they are in agreement.  CODE STATUS: full  TOTAL TIME TAKING CARE OF THIS PATIENT: 35 minutes.   More than 50% of the visit was spent in counseling/coordination of care    Dustin Flock M.D on 05/26/2015   Between 7am to 6pm - Pager - [phone]  After 6pm go to [email or web address] - password [email or web address] [facility]  [facility]  Office   [phone]  CC: Primary care physician; Pcp Not In System

## 2015-05-26 NOTE — Plan of Care (Signed)
Problem: Consults Goal: Skin Care Protocol Initiated - if Braden Score 18 or less If consults are not indicated, leave blank or document N/A  Outcome: Not Progressing Skin around stoma site with irration d/t loose stool. Barrier spray applied, ostomy bag changed d/t leaking.

## 2015-05-27 LAB — BASIC METABOLIC PANEL
ANION GAP: 4 — AB (ref 5–15)
BUN: 9 mg/dL (ref 6–20)
CO2: 26 mmol/L (ref 22–32)
CREATININE: 0.4 mg/dL — AB (ref 0.44–1.00)
Calcium: 7.9 mg/dL — ABNORMAL LOW (ref 8.9–10.3)
Chloride: 106 mmol/L (ref 101–111)
GFR calc Af Amer: >60 mL/min (ref >60)
GFR calc non Af Amer: >60 mL/min (ref >60)
Glucose, Bld: 96 mg/dL (ref 65–99)
Potassium: 3.9 mmol/L (ref 3.5–5.1)
Sodium: 136 mmol/L (ref 135–145)

## 2015-05-27 LAB — CBC
HCT: 30.4 % — ABNORMAL LOW (ref 35.0–47.0)
Hemoglobin: 9.2 g/dL — ABNORMAL LOW (ref 12.0–16.0)
MCH: 23.1 pg — ABNORMAL LOW (ref 26.0–34.0)
MCHC: 30.3 g/dL — ABNORMAL LOW (ref 32.0–36.0)
MCV: 76.2 fL — AB (ref 80.0–100.0)
Platelets: 469 10*3/uL — ABNORMAL HIGH (ref 150–440)
RBC: 3.99 MIL/uL (ref 3.80–5.20)
RDW: 25 % — ABNORMAL HIGH (ref 11.5–14.5)
WBC: 9.7 10*3/uL (ref 3.6–11.0)

## 2015-05-27 NOTE — Progress Notes (Signed)
[facility] [place] NAME: Yolanda Wells    MR#:  [id number]  DATE OF BIRTH:  [birth year]  SUBJECTIVE:  CHIEF COMPLAINT:   Chief Complaint  Patient presents with  . Shortness of Breath  . Weakness   Very weak no other complaints REVIEW OF SYSTEMS:  CONSTITUTIONAL:  fever, positive fatigue and weakness.  EYES: No blurred or double vision.  EARS, NOSE, AND THROAT: No tinnitus or ear pain.  RESPIRATORY: No cough, positive shortness of breath, wheezing or hemoptysis.  CARDIOVASCULAR: No chest pain, orthopnea, edema.  GASTROINTESTINAL: No nausea, vomiting, diarrhea or abdominal pain.  GENITOURINARY: No dysuria, hematuria.  ENDOCRINE: No polyuria, nocturia,  HEMATOLOGY: No anemia, easy bruising or bleeding SKIN: No rash or lesion. MUSCULOSKELETAL: No joint pain or arthritis.   NEUROLOGIC: No tingling, numbness, weakness.  PSYCHIATRY: No anxiety or depression.   ROS  DRUG ALLERGIES:  No Known Allergies  VITALS:  Blood pressure 119/64, pulse 86, temperature 98.6 F (37 C), temperature source Oral, resp. rate 18, height 5\' 7"  (1.702 m), weight 98.431 kg (217 lb), SpO2 100 %.  PHYSICAL EXAMINATION:  GENERAL:  81 y.o.-year-old patient lying in the bed with no acute distress.  EYES: Pupils equal, round, reactive to light and accommodation. No scleral icterus. Extraocular muscles intact.  HEENT: Head atraumatic, normocephalic. Oropharynx and nasopharynx clear.  NECK:  Supple, no jugular venous distention. No thyroid enlargement, no tenderness.  LUNGS: Normal breath sounds bilaterally, no wheezing, rales,rhonchi or crepitation. No use of accessory muscles of respiration.  CARDIOVASCULAR: S1, S2 normal. No murmurs, rubs, or gallops.  ABDOMEN: Soft, nontender, nondistended. Bowel sounds present. No organomegaly or mass. Colostomy bag in place. EXTREMITIES: No pedal edema, cyanosis, or clubbing. Right foot trans metatarsal amputation  present. NEUROLOGIC: Cranial nerves II through XII are intact. Muscle strength 3/5 in lower extremities. Sensation intact. Gait not checked.  PSYCHIATRIC: The patient is alert and oriented x 3.  SKIN: No obvious rash, lesion, or ulcer.   Physical Exam LABORATORY PANEL:   CBC  Recent Labs Lab 05/27/15 0543  WBC 9.7  HGB 9.2*  HCT 30.4*  PLT 469*   ------------------------------------------------------------------------------------------------------------------  Chemistries   Recent Labs Lab 05/27/15 0543  NA 136  K 3.9  CL 106  CO2 26  GLUCOSE 96  BUN 9  CREATININE 0.40*  CALCIUM 7.9*   ------------------------------------------------------------------------------------------------------------------  Cardiac Enzymes  Recent Labs Lab 05/24/15 2344  TROPONINI <0.03   ------------------------------------------------------------------------------------------------------------------  RADIOLOGY:  Mr Brain Wo Contrast  05/25/2015   CLINICAL DATA:  Dizziness.  Slurred speech.  Unable walk.  CVA.  EXAM: MRI HEAD WITHOUT CONTRAST  TECHNIQUE: Multiplanar, multiecho pulse sequences of the brain and surrounding structures were obtained without intravenous contrast.  COMPARISON:  CT head without contrast 05/25/2015.  FINDINGS: Mild generalized atrophy is within normal limits for age. No significant white matter disease is present. The diffusion-weighted images demonstrate no acute infarct. No acute hemorrhage or mass lesion is present. The ventricles are proportionate to the degree of atrophy. No significant extra-axial fluid collection is present.  Abnormal signal in the right vertebral artery suggests lower likely occluded flow. Flow is present in the left vertebral artery in bilateral carotid arteries. Bilateral lens replacements are noted. The globes orbits are the intact. The paranasal sinuses and mastoid air cells are clear.  Skullbase is within normal limits. Midline images  demonstrate degenerative change in the upper cervical spine. No focal intracranial abnormalities are evident.  IMPRESSION: 1. Moderate  generalized atrophy and mild diffuse white matter disease is likely within normal limits for age. 2. Occluded right vertebral artery. The left vertebral artery and basilar artery are patent. 3. No acute intracranial abnormality.   Electronically Signed   By: San Morelle M.D.   On: 05/25/2015 17:02    ASSESSMENT AND PLAN:   This is an 81 year old African American female admitted for pulmonary embolism and cerebellar infarct. 1.speech changes appears to be chronic MRI of the brain negative for acute CVA 2. Pulmonary embolism: Bilateral; patient has been re-started on Xarelto. (The patient had been on treatment for DVT previously). She is status post IVC filter in light of her having a massive GI bleed recently requiring hemicolectomy 3. Hypertension: Controlled at this time 4.  Polycythemia vera was on hydroxyurea 5. GI prophylaxis: None   All the records are reviewed and case discussed with Care Management/Social Workerr. Management plans discussed with the patient, family and they are in agreement.  CODE STATUS: full  TOTAL TIME TAKING CARE OF THIS PATIENT: 35 minutes.   More than 50% of the visit was spent in counseling/coordination of care    Dustin Flock M.D on 05/27/2015   Between 7am to 6pm - Pager - [phone]  After 6pm go to [email or web address] - password [email or web address] [facility]  Office  [phone]  CC: Primary care physician; Pcp Not In System

## 2015-05-27 NOTE — Progress Notes (Signed)
PT Cancellation Note  Patient Details Name: Yolanda Wells MRN: [id number] DOB: 07-11-1932   Cancelled Treatment:    Reason Eval/Treat Not Completed: Patient not medically ready. Holding pt treatment at this time due to acute PE. Per hospital policy, will hold until 48hr p start of therapeutic dose of anticoagulation. Will attempt at later date/time.    [name] C 05/27/2015, 8:35 AM  8:35 AM  [name], PT, DPT [facility] License # [id number]

## 2015-05-27 NOTE — Progress Notes (Signed)
[facility] Encounter Note  Patient: Yolanda Wells / Admit Date: 05/24/2015 / Date of Encounter: 05/27/2015, 3:28 PM   Subjective: No significant changes or hemodynamic instability. No evidence of atrial fibrillation  Review of Systems: Positive for: Lower extremity edema Negative for: Vision change, hearing change, syncope, dizziness, nausea, vomiting,diarrhea, bloody stool, stomach pain, cough, congestion, diaphoresis, urinary frequency, urinary pain,skin lesions, skin rashes Others previously listed  Objective: Telemetry: Normal sinus rhythm Physical Exam: Blood pressure 125/71, pulse 82, temperature 97.8 F (36.6 C), temperature source Oral, resp. rate 17, height 5\' 7"  (1.702 m), weight 217 lb (98.431 kg), SpO2 100 %. Body mass index is 33.98 kg/(m^2). General: Well developed, well nourished, in no acute distress. Head: Normocephalic, atraumatic, sclera non-icteric, no xanthomas, nares are without discharge. Neck: No apparent masses Lungs: Normal respirations with few wheezes, no rhonchi, no rales , no crackles   Heart: Regular rate and rhythm, normal S1 S2, no murmur, no rub, no gallop, PMI is normal size and placement, carotid upstroke normal without bruit, jugular venous pressure normal Abdomen: Soft, non-tender, non-distended with normoactive bowel sounds. No hepatosplenomegaly. Abdominal aorta is normal size without bruit Extremities: 2+ edema, no clubbing, no cyanosis, positive ulcers,  Peripheral: 2+ radial, 2+ femoral, 0 + dorsal pedal pulses Neuro: Alert and oriented. Moves all extremities spontaneously. Psych:  Responds to questions appropriately with a normal affect.   Intake/Output Summary (Last 24 hours) at 05/27/15 1528 Last data filed at 05/27/15 1130  Gross per 24 hour  Intake    720 ml  Output      0 ml  Net    720 ml    Inpatient Medications:  . docusate sodium  100 mg Oral BID  . feeding supplement (PRO-STAT SUGAR FREE 64)  30 mL  Oral BID  . potassium chloride SA  20 mEq Oral Daily  . Rivaroxaban  15 mg Oral BID WC  . sodium chloride  3 mL Intravenous Q12H   Infusions:    Labs:  Recent Labs  05/24/15 1913 05/27/15 0543  NA 136 136  K 4.1 3.9  CL 103 106  CO2 24 26  GLUCOSE 103* 96  BUN 10 9  CREATININE 0.49 0.40*  CALCIUM 8.4* 7.9*   No results for input(s): AST, ALT, ALKPHOS, BILITOT, PROT, ALBUMIN in the last 72 hours.  Recent Labs  05/24/15 1913 05/27/15 0543  WBC 11.0 9.7  HGB 10.6* 9.2*  HCT 35.2 30.4*  MCV 76.0* 76.2*  PLT 520* 469*    Recent Labs  05/24/15 2344  TROPONINI <0.03   Invalid input(s): POCBNP  Recent Labs  05/24/15 1913  HGBA1C 5.1     Weights: Filed Weights   05/25/15 0825 05/26/15 0325 05/27/15 0554  Weight: 212 lb 3.2 oz (96.253 kg) 213 lb 8 oz (96.843 kg) 217 lb (98.431 kg)     Radiology/Studies:  Ct Head Wo Contrast  05/25/2015   CLINICAL DATA:  Dysarthria. History of pulmonary embolism, polycythemia vera.  EXAM: CT HEAD WITHOUT CONTRAST  TECHNIQUE: Contiguous axial images were obtained from the base of the skull through the vertex without intravenous contrast.  COMPARISON:  None.  FINDINGS: The ventricles and sulci are normal for age ; dense probable choroid plexus extends to the foramen of Monro. Moderate disproportionate cerebellar and pontine volume loss. No intraparenchymal hemorrhage, mass effect nor midline shift. Patchy supratentorial white matter hypodensities are less than expected for patient's age and though non-specific suggest sequelae of chronic small vessel ischemic disease. No  acute large vascular territory infarcts. 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  No abnormal extra-axial fluid collections. Mildly prominent LEFT frontal extra-axial space, nonspecific. Basal cisterns are patent. Mild calcific atherosclerosis of the carotid siphons.  No skull fracture. The included ocular globes and orbital contents are non-suspicious. Bilateral  ocular lens implants. The mastoid aircells and included paranasal sinuses are well-aerated.  IMPRESSION: No acute intracranial process.  Probable choroid plexus extending to the foramen of Monro, unlikely to represent blood products though, if clinically indicated, recommend follow-up CT head in 6 hours to verify stability of findings.  Disproportionate cerebellar and pontine volume loss with small suspected RIGHT inferior cerebellar infarct.   Electronically Signed   By: Elon Alas M.D.   On: 05/25/2015 02:22   Ct Angio Chest Pe W/cm &/or Wo Cm  05/24/2015   CLINICAL DATA:  Generalized weakness and inability to walk. Shortness of breath. History of DVTs.  EXAM: CT ANGIOGRAPHY CHEST WITH CONTRAST  TECHNIQUE: Multidetector CT imaging of the chest was performed using the standard protocol during bolus administration of intravenous contrast. Multiplanar CT image reconstructions and MIPs were obtained to evaluate the vascular anatomy.  CONTRAST:  163mL OMNIPAQUE IOHEXOL 350 MG/ML SOLN  COMPARISON:  02/19/2012  FINDINGS: Technically adequate study with good opacification of the central and segmental pulmonary arteries. Filling defects are demonstrated in segmental branch vessels to the right lower lung consistent with focal pulmonary embolus. There is volume loss and infiltration in the right lower lung which may indicate pulmonary infarct. RT to LV ratio equals 0.98, representing upper limits of normal. Right heart is noted to be enlarged also on the previous study in this may represent typical pattern for this patient. Clinical correlation for possible right heart strain is suggested.  Normal caliber thoracic aorta. No aortic dissection. Great vessel origins are patent. Calcification in the aorta and Coronary arteries. Calcification in the mitral valve annulus. No significant lymphadenopathy in the chest. Esophagus is decompressed.  Atelectasis in the lung bases. No focal consolidation. No pneumothorax. No  pleural effusions. Airways appear patent.  Included portions of the upper abdominal organs demonstrate a cyst in the upper pole right kidney, accessory spleen, apparent postoperative changes in the left upper quadrant mesenteric with some stranding or scarring which may be postoperative. Degenerative changes in the spine and shoulders. Thoracic scoliosis convex towards the right. No destructive bone lesions.  Calcified dominant nodule in the right lower pole thyroid measuring 2.4 cm diameter. No change since prior study.  Review of the MIP images confirms the above findings.  IMPRESSION: Positive for right lower lobe pulmonary emboli. Atelectasis or infarct in the right lung base. Additional incidental findings as above.  These results were called by telephone at the time of interpretation on 05/24/2015 at 11:29 pm to Dr. Owens Shark, who verbally acknowledged these results.   Electronically Signed   By: Lucienne Capers M.D.   On: 05/24/2015 23:32   Mr Brain Wo Contrast  05/25/2015   CLINICAL DATA:  Dizziness.  Slurred speech.  Unable walk.  CVA.  EXAM: MRI HEAD WITHOUT CONTRAST  TECHNIQUE: Multiplanar, multiecho pulse sequences of the brain and surrounding structures were obtained without intravenous contrast.  COMPARISON:  CT head without contrast 05/25/2015.  FINDINGS: Mild generalized atrophy is within normal limits for age. No significant white matter disease is present. The diffusion-weighted images demonstrate no acute infarct. No acute hemorrhage or mass lesion is present. The ventricles are proportionate to the degree of atrophy. No significant extra-axial fluid collection  is present.  Abnormal signal in the right vertebral artery suggests lower likely occluded flow. Flow is present in the left vertebral artery in bilateral carotid arteries. Bilateral lens replacements are noted. The globes orbits are the intact. The paranasal sinuses and mastoid air cells are clear.  Skullbase is within normal limits. Midline  images demonstrate degenerative change in the upper cervical spine. No focal intracranial abnormalities are evident.  IMPRESSION: 1. Moderate generalized atrophy and mild diffuse white matter disease is likely within normal limits for age. 2. Occluded right vertebral artery. The left vertebral artery and basilar artery are patent. 3. No acute intracranial abnormality.   Electronically Signed   By: San Morelle M.D.   On: 05/25/2015 17:02   US Carotid Bilateral  05/25/2015   CLINICAL DATA:  CVA.  History of hypertension and syncopal episode.  EXAM: BILATERAL CAROTID DUPLEX ULTRASOUND  TECHNIQUE: Pearline Cables scale imaging, color Doppler and duplex ultrasound were performed of bilateral carotid and vertebral arteries in the neck.  COMPARISON:  None.  FINDINGS: Criteria: Quantification of carotid stenosis is based on velocity parameters that correlate the residual internal carotid diameter with NASCET-based stenosis levels, using the diameter of the distal internal carotid lumen as the denominator for stenosis measurement.  The following velocity measurements were obtained:  RIGHT  ICA:  56/17 cm/sec  CCA:  10/25 cm/sec  SYSTOLIC ICA/CCA RATIO:  8.52  DIASTOLIC ICA/CCA RATIO:  1.1  ECA:  42 cm/sec  LEFT  ICA:  80/33 cm/sec  CCA:  77/82 cm/sec  SYSTOLIC ICA/CCA RATIO:  1.4  DIASTOLIC ICA/CCA RATIO:  2.5  ECA:  41 cm/sec  RIGHT CAROTID ARTERY: The right common carotid artery is noted to be mildly tortuous (representative image 2). There is a very minimal amount of eccentric mixed echogenic plaque within the right carotid bulb (image 11), extending to involve the origin and proximal aspects of the right internal carotid artery (image 17), not resulting in elevated peak systolic velocities within the interrogated course of the right internal carotid artery to suggest a hemodynamically significant stenosis.  RIGHT VERTEBRAL ARTERY:  Antegrade flow  LEFT CAROTID ARTERY: There is a minimal amount of eccentric mixed  echogenic plaque within the left carotid bulb (image 48), not resulting in elevated peak systolic velocities within the interrogated course of the left internal carotid artery to suggest a hemodynamically significant stenosis.  LEFT VERTEBRAL ARTERY:  Antegrade flow  IMPRESSION: Very minimal amount of bilateral atherosclerotic plaque, right subjectively greater than left, not resulting in a hemodynamically significant stenosis.   Electronically Signed   By: Sandi Mariscal M.D.   On: 05/25/2015 11:42   US Venous Img Lower Bilateral  05/18/2015   CLINICAL DATA:  Bilateral leg pain and edema.  EXAM: BILATERAL LOWER EXTREMITY VENOUS DOPPLER ULTRASOUND  TECHNIQUE: Gray-scale sonography with graded compression, as well as color Doppler and duplex ultrasound were performed to evaluate the lower extremity deep venous systems from the level of the common femoral vein and including the common femoral, femoral, profunda femoral, popliteal and calf veins including the posterior tibial, peroneal and gastrocnemius veins when visible. The superficial great saphenous vein was also interrogated. Spectral Doppler was utilized to evaluate flow at rest and with distal augmentation maneuvers in the common femoral, femoral and popliteal veins.  COMPARISON:  03/25/2015  FINDINGS: There is occlusive or nearly occlusive thrombus filling the bilateral lower extremity deep venous system, extending from the common femoral veins into the lower popliteal veins (and likely into the legs as the  calf veins are not visible). Central propagation cannot be established on this study, but a waveform within the partially thrombosed right common femoral vein shows respiratory phasicity, consistent with IVC patency at least.  The hypoechoic left calf mass, subcutaneous or deep fascial, is again seen - 64 x 14 x 36 mm today. Although still sizable, the mass is more flattened and ovoid in appearance, and hematoma is again favored.  Critical Value/emergent  results were called by telephone at the time of interpretation on 05/18/2015 at 9:41 pm to Dr. Lavonia Drafts , who verbally acknowledged these results.  IMPRESSION: 1. Diffuse occlusive deep venous thrombosis in the bilateral lower extremities. 2. Hypoechoic mass in the left calf, 6 cm in maximal dimension, persists since May 2016. There has been some contraction, and resolving hematoma is again favored. Reportedly this mass is palpable, and clinical follow-up is recommended. If no resolution, MRI is recommended.   Electronically Signed   By: Monte Fantasia M.D.   On: 05/18/2015 21:45     Assessment and Recommendation  81 y.o. female with essential hypertension and acute pulmonary embolism by CAT scan causing hypoxia with chronic lower extremity edema and elevated troponin consistent with demand ischemia rather than acute coronary syndrome. 1. No further cardiac intervention at this time 2. Anticoagulation for further risk reduction in pulmonary embolism and deep venous thrombosis watching closely for anemia if able 3. No further intervention of minimal elevation of troponin consistent with demand ischemia 4. Begin ambulation and further following for any new significant symptoms and/or atrial fibrillation or risk reduction in stroke 5. Call if further questions  Signed, Serafina Royals M.D. FACC

## 2015-05-28 LAB — CBC WITH DIFFERENTIAL/PLATELET
Basophils Absolute: 0.1 10*3/uL (ref 0–0.1)
Basophils Relative: 1 %
EOS ABS: 0.1 10*3/uL (ref 0–0.7)
Eosinophils Relative: 1 %
HEMATOCRIT: 34.2 % — AB (ref 35.0–47.0)
HEMOGLOBIN: 10.1 g/dL — AB (ref 12.0–16.0)
Lymphs Abs: 2.3 10*3/uL (ref 1.0–3.6)
MCH: 22.7 pg — ABNORMAL LOW (ref 26.0–34.0)
MCHC: 29.6 g/dL — ABNORMAL LOW (ref 32.0–36.0)
MCV: 76.7 fL — ABNORMAL LOW (ref 80.0–100.0)
MONO ABS: 0.3 10*3/uL (ref 0.2–0.9)
Monocytes Relative: 3 %
NEUTROS ABS: 9 10*3/uL — AB (ref 1.4–6.5)
Platelets: 523 10*3/uL — ABNORMAL HIGH (ref 150–440)
RBC: 4.46 MIL/uL (ref 3.80–5.20)
RDW: 24.8 % — ABNORMAL HIGH (ref 11.5–14.5)
WBC: 11.7 10*3/uL — ABNORMAL HIGH (ref 3.6–11.0)

## 2015-05-28 MED ORDER — DOCUSATE SODIUM 100 MG PO CAPS: 100.0000 mg | ORAL_CAPSULE | Freq: Two times a day (BID) | ORAL | Status: DC

## 2015-05-28 MED ORDER — ACETAMINOPHEN 325 MG PO TABS: 650.0000 mg | ORAL_TABLET | Freq: Four times a day (QID) | ORAL | Status: DC | PRN

## 2015-05-28 MED ORDER — OXYCODONE-ACETAMINOPHEN 2.5-325 MG PO TABS: 1.0000 | ORAL_TABLET | ORAL | Status: DC | PRN

## 2015-05-28 NOTE — Evaluation (Signed)
Physical Therapy Evaluation Patient Details Name: Yolanda Wells MRN: [id number] DOB: [birth year] Today's Date: 05/28/2015   History of Present Illness  Pt is an 81 y.o. female (with h/o R toe amp and B THR) who was discharged from [facility] but was too weak to stand and get into her home and also was SOB.  Chart notes pt had increased confusion past week and also noted to have new dysarthria.  B LE US show diffuse occlusive DVT.  CT angio chest (+) for R LL PE; CT head negative for acute intracranial process but found some suspected R cerebellar infarct; MRI shows occluded R vertebral artery.  Pt s/p IVC filter 05/25/15.  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  Clinical Impression  Currently pt demonstrates impairments with strength, ROM, balance, and limitations with functional mobility.  Prior to recent hospital admissions and STR stay, pt was ambulating short distances with rollator in her home and used motorized w/c for longer distances.  Pt lives alone (pt reports not living at ALF although pt's description sounds similar to ALF set-up).  Currently pt is mod assist supine to sit and mod assist x2 to transfer bed to chair (pt unable to stand long enough to attempt ambulation with RW).  Pt would benefit from skilled PT to address above noted impairments and functional limitations.  Recommend pt discharge to STR when medically appropriate.     Follow Up Recommendations SNF    Equipment Recommendations  None recommended by PT    Recommendations for Other Services       Precautions / Restrictions Precautions Precautions: Fall Restrictions Weight Bearing Restrictions: No      Mobility  Bed Mobility Overal bed mobility: Needs Assistance Bed Mobility: Supine to Sit     Supine to sit: Mod assist;HOB elevated     General bed mobility comments: assist for LE's and trunk; vc's for technique to assist  Transfers Overall transfer level: Needs assistance   Transfers: Sit to/from  Stand;Stand Pivot Transfers Sit to Stand: Mod assist;Max assist;+2 physical assistance (Sit to/from stand with RW:  pt able to stand for about 5 seconds before lowering herself (with assist) onto bed (pt unable to continue standing with max vc's and 2 assist to maintain standing)) Stand pivot transfers: Mod assist;+2 physical assistance (pt able to take a few steps with 2 assist bed to chair; no AD)          Ambulation/Gait             General Gait Details: Not appropriate at this time d/t pt unable to maintain standing with RW >5 seconds with 2 assist.  Stairs            Wheelchair Mobility    Modified Rankin (Stroke Patients Only)       Balance Overall balance assessment: Needs assistance Sitting-balance support: Feet supported;No upper extremity supported Sitting balance-Leahy Scale: Fair     Standing balance support: Bilateral upper extremity supported Standing balance-Leahy Scale: Poor                               Pertinent Vitals/Pain Pain Assessment: No/denies pain  O2 >93% on room air during session. Pt's HR 96 bpm pre-activity; (telemetry nurse reports pt's HR increased max to 126 bpm during session); end of session pt's HR 103 bpm.    Home Living Family/patient expects to be discharged to:: [place]: Alone   Type of  Home: Apartment (pt reports it is not an ALF apt) Home Access: Level entry;Elevator       Home Equipment: Walker - 4 wheels;Bedside commode;Shower seat;Wheelchair - power      Prior Function Level of Independence: Independent with assistive device(s)         Comments: Uses rollator for short distances (up to 50 feet) and motorized wheelchair for longer distances (pt reports she has not ambulated since going to rehab recently)     Hand Dominance        Extremity/Trunk Assessment   Upper Extremity Assessment: Generalized weakness           Lower Extremity Assessment:  RLE deficits/detail;LLE deficits/detail RLE Deficits / Details: R LE generalized weakness; R DF ROM to neutral LLE Deficits / Details: L hip flexion 3-/5; L knee extension 3-/5; L knee flexion 3-/5; L DF 3-/5; L DF ROM 10 degrees short of neutral     Communication   Communication: No difficulties (question some confusion during session)  Cognition Arousal/Alertness: Awake/alert Behavior During Therapy: WFL for tasks assessed/performed Overall Cognitive Status: No family/caregiver present to determine baseline cognitive functioning (oriented x4 but some confusion noted during session)                      General Comments General comments (skin integrity, edema, etc.): pt's colostomy noted to be leaking during session; nursing notified    Exercises   Performed semi-supine B LE therapeutic exercise x 10 reps:  Ankle pumps (AROM B LE's); quad sets x3 second holds (AROM B LE's); glute squeezes x3 second holds (AROM B); SAQ's (AROM R; AAROM L); heelslides (AAROM R; AAROM L), hip abd/adduction (AAROM R; AAROM L).  Pt required vc's and tactile cues for correct technique with exercises.       Assessment/Plan    PT Assessment Patient needs continued PT services  PT Diagnosis Difficulty walking;Generalized weakness   PT Problem List Decreased strength;Decreased range of motion;Decreased activity tolerance;Decreased balance;Decreased mobility  PT Treatment Interventions DME instruction;Gait training;Functional mobility training;Therapeutic activities;Therapeutic exercise;Balance training;Patient/family education;Wheelchair mobility training   PT Goals (Current goals can be found in the Care Plan section) Acute Rehab PT Goals Patient Stated Goal: To walk again PT Goal Formulation: With patient Time For Goal Achievement: 06/11/15 Potential to Achieve Goals: Fair    Frequency Min 2X/week   Barriers to discharge Decreased caregiver support      Co-evaluation                End of Session Equipment Utilized During Treatment: Gait belt (gait belt under arm-pits d/t colostomy) Activity Tolerance: Patient limited by fatigue Patient left: in chair;with call bell/phone within reach;with chair alarm set Nurse Communication: Mobility status         Time: 1583-0940 PT Time Calculation (min) (ACUTE ONLY): 31 min   Charges:   PT Evaluation $Initial PT Evaluation Tier I: 1 Procedure PT Treatments $Therapeutic Exercise: 8-22 mins   PT G CodesLeitha Bleak 05/29/15, 12:00 PM Leitha Bleak, [place]

## 2015-05-28 NOTE — Discharge Instructions (Signed)
°  DIET:  Cardiac diet  DISCHARGE CONDITION:  Good  ACTIVITY:  Activity as tolerated  OXYGEN:  Home Oxygen: Yes.     Oxygen Delivery: 2 liters/min via Patient connected to nasal cannula oxygen as needed  DISCHARGE LOCATION:  nursing home    ADDITIONAL DISCHARGE INSTRUCTION: pt eval and treatement   If you experience worsening of your admission symptoms, develop shortness of breath, life threatening emergency, suicidal or homicidal thoughts you must seek medical attention immediately by calling 911 or calling your MD immediately  if symptoms less severe.  You Must read complete instructions/literature along with all the possible adverse reactions/side effects for all the Medicines you take and that have been prescribed to you. Take any new Medicines after you have completely understood and accpet all the possible adverse reactions/side effects.   Please note  You were cared for by a hospitalist during your hospital stay. If you have any questions about your discharge medications or the care you received while you were in the hospital after you are discharged, you can call the unit and asked to speak with the hospitalist on call if the hospitalist that took care of you is not available. Once you are discharged, your primary care physician will handle any further medical issues. Please note that NO REFILLS for any discharge medications will be authorized once you are discharged, as it is imperative that you return to your primary care physician (or establish a relationship with a primary care physician if you do not have one) for your aftercare needs so that they can reassess your need for medications and monitor your lab values.

## 2015-05-28 NOTE — Care Management Important Message (Signed)
Important Message  Patient Details  Name: Yolanda Wells MRN: [id number] Date of Birth: 1932/10/20   Medicare Important Message Given:  Yes-second notification given    Darius Bump [place]mond 05/28/2015, 10:33 AM

## 2015-05-28 NOTE — Consult Note (Signed)
WOC ostomy consult note Stoma type/location: LLQ Colostomy.  Denuded skin from repeated leaking at SNF, per daughter.  Stomal assessment/size: 1 3/8" round pink and moist deep cresing at 3 and 9 o'clock. Will add a barrier ring Peristomal assessment: Denuded, raw and tender to touch.  Treatment options for stomal/peristomal skin: Will add barrier ring to 2 1/4" two piece system.  Peristomal skin will be treated with stoma powder and no sting skin prep.  I have added an ostomy belt for a more secure fit. Demonstrated crusting to granddaughter who is at bedside.  Output Soft brown stool Ostomy pouching:2pc. 2 1/4" system.  Stoma powder and no sting skin prep to peritstomal skin.  Barrier ring for added convexity and improved wear time.  Ostomy belt for added security.  Education provided: Granddaughter at bedside, who is in healthcare here at [facility].  Demonstrated crusting and use of belt and barrier ring.  States she will ensure that the Rehab center uses these items.  Enrolled patient in [place] program: No Will not follow at this time.  Please re-consult if needed.  Domenic Moras RN BSN [place] Pager [phone]

## 2015-05-28 NOTE — Clinical Social Work Note (Signed)
Clinical Social Work Assessment  Patient Details  Name: Yolanda Wells MRN: [id number] Date of Birth: September 13, 1932  Date of referral:  05/28/15               Reason for consult:  Facility Placement                Permission sought to share information with:  Facility Sport and exercise psychologist, Family Supports Permission granted to share information::  Yes, Verbal Permission Granted  Name::     Granddaughter [name] [place]::     Relationship::     Contact Information:     Housing/Transportation Living arrangements for the past 2 months:   (Independant living facility [facility]) Source of Information:  Patient, Other (Comment Required) (Granddaughter) Patient Interpreter Needed:  None Criminal Activity/Legal Involvement Pertinent to Current Situation/Hospitalization:  No - Comment as needed Significant Relationships:  Other Family Members Lives with:  Self Do you feel safe going back to the place where you live?  No (Too weak to live alone at this time) Need for family participation in patient care:  Yes (Comment)  Care giving concerns:  Pt and pt's granddaughter did not want to return to previous facility.  They would prefer [facility] or [facility] if possible.   Social Worker assessment / plan:  CSW spoke to pt.  She was sitting up in her chair sleepy but able to answer all orientation questions and was aware of her SNF recommendation.  CSW was given permission to speak to pt's granddaughter about DC planning.  Both were adamant that they did not want to return to [facility].  CSW has sent out bed search for all facilities in [place] and will f/u once offers have been received.  Per pt's granddaughter, pt lives [facility] at [facility] an independent living facility.  She normally walks with a rolaid walke.  CSW will f/u once offers have been received.    Employment status:  Retired, Disabled (Comment on whether or not currently receiving  Disability) Insurance information:  Medicare PT Recommendations:  [place] / Referral to community resources:     Patient/Family's Response to care:  Pt and pt's granddaughter were in agreement with DC to SNF.  Patient/Family's Understanding of and Emotional Response to Diagnosis, Current Treatment, and Prognosis:  Pt and pt's grandaughter bother verbalized understanding of SNF recommendation and were in agreement with DC to SNF.    Emotional Assessment Appearance:  Appears stated age Attitude/Demeanor/Rapport:   (pleasent) Affect (typically observed):   (unremarkable) Orientation:  Oriented to Self, Oriented to Place, Oriented to  Time, Oriented to Situation Alcohol / Substance use:  Never Used Psych involvement (Current and /or in the community):  No (Comment)  Discharge Needs  Concerns to be addressed:  Care Coordination Readmission within the last 30 days:  No Current discharge risk:  None Barriers to Discharge:  No Barriers Identified   Mathews Argyle, LCSW 05/28/2015, 11:41 AM

## 2015-05-28 NOTE — Care Management (Signed)
Patient has had IVC filter placed.  She has ruled in for cerebellar infarct. It is not clear whether this is a Xarelto failure.  Have requested to see if this patient would happen to meet criteria for LTAC.  Physical therapy has not assessed patient due to contraindication due to acute pulmonary emboli and the need to be on anticoagulation at least 48 hours prior to physical therapy.

## 2015-05-28 NOTE — Care Management (Signed)
Physical therapy is recommending skilled nursing.  Anticipate patient to discharge to a skilled nursing facility today

## 2015-05-28 NOTE — Discharge Summary (Addendum)
Yolanda Wells, 81 y.o., DOB March 01, 1932, MRN [id number]. Admission date: 05/24/2015 Discharge Date 05/28/2015 Primary MD Pcp Not In System Admitting Physician Harrie Foreman, MD  Admission Diagnosis  Shortness of breath [R06.02] Dysarthria [R47.1] Pulmonary embolism on right [I26.99]  Discharge Diagnosis   Active Problems:   Pulmonary emboli  DVT  HTN  Polycythema vera  h/o massive diverticular bleed s/p colectomy with colostomy creation harmann procedure          Hospital Course The patient presents to the emergency department via EMS after being discharged from her rehabilitation facility, pt was  too weak to stand on her way into her own home. She also complained of some shortness of breath at the time. She denies chest pain, nausea, vomiting, or diaphoresis.  In the emergency department the patient was found to be mildly tachycardic which prompted CTA of the chest which showed bilateral pulmonary emboli. Also of note, the patient's granddaughter has noticed weakness and progressive confusion over the last week. Her change in global mental status as well as pulmonary embolism prompted emergency department staff to call for admission. Patient was admitted to the hospital started on a heparin drip. She also had MRI of the brain which ruled out an acute CVA. Patient does have a history of having a large GI diverticular bleed recently and had to have a hemicolectomy emergently. Therefore she had IVC filter placed during this hospitalization. Patient is very weak and deconditioned and needs further rehabilitation.              Consults  cardiology, neurology, vascular  Significant Tests:  See full reports for all details      Ct Head Wo Contrast  05/25/2015   CLINICAL DATA:  Dysarthria. History of pulmonary embolism, polycythemia vera.  EXAM: CT HEAD WITHOUT CONTRAST  TECHNIQUE: Contiguous axial images were obtained from the base of the skull through the vertex without  intravenous contrast.  COMPARISON:  None.  FINDINGS: The ventricles and sulci are normal for age ; dense probable choroid plexus extends to the foramen of Monro. Moderate disproportionate cerebellar and pontine volume loss. No intraparenchymal hemorrhage, mass effect nor midline shift. Patchy supratentorial white matter hypodensities are less than expected for patient's age and though non-specific suggest sequelae of chronic small vessel ischemic disease. No acute large vascular territory infarcts. 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  No abnormal extra-axial fluid collections. Mildly prominent LEFT frontal extra-axial space, nonspecific. Basal cisterns are patent. Mild calcific atherosclerosis of the carotid siphons.  No skull fracture. The included ocular globes and orbital contents are non-suspicious. Bilateral ocular lens implants. The mastoid aircells and included paranasal sinuses are well-aerated.  IMPRESSION: No acute intracranial process.  Probable choroid plexus extending to the foramen of Monro, unlikely to represent blood products though, if clinically indicated, recommend follow-up CT head in 6 hours to verify stability of findings.  Disproportionate cerebellar and pontine volume loss with small suspected RIGHT inferior cerebellar infarct.   Electronically Signed   By: Elon Alas M.D.   On: 05/25/2015 02:22   Ct Angio Chest Pe W/cm &/or Wo Cm  05/24/2015   CLINICAL DATA:  Generalized weakness and inability to walk. Shortness of breath. History of DVTs.  EXAM: CT ANGIOGRAPHY CHEST WITH CONTRAST  TECHNIQUE: Multidetector CT imaging of the chest was performed using the standard protocol during bolus administration of intravenous contrast. Multiplanar CT image reconstructions and MIPs were obtained to evaluate the vascular anatomy.  CONTRAST:  135mL OMNIPAQUE IOHEXOL 350 MG/ML SOLN  COMPARISON:  02/19/2012  FINDINGS: Technically adequate study with good opacification of the central and  segmental pulmonary arteries. Filling defects are demonstrated in segmental branch vessels to the right lower lung consistent with focal pulmonary embolus. There is volume loss and infiltration in the right lower lung which may indicate pulmonary infarct. RT to LV ratio equals 0.98, representing upper limits of normal. Right heart is noted to be enlarged also on the previous study in this may represent typical pattern for this patient. Clinical correlation for possible right heart strain is suggested.  Normal caliber thoracic aorta. No aortic dissection. Great vessel origins are patent. Calcification in the aorta and Coronary arteries. Calcification in the mitral valve annulus. No significant lymphadenopathy in the chest. Esophagus is decompressed.  Atelectasis in the lung bases. No focal consolidation. No pneumothorax. No pleural effusions. Airways appear patent.  Included portions of the upper abdominal organs demonstrate a cyst in the upper pole right kidney, accessory spleen, apparent postoperative changes in the left upper quadrant mesenteric with some stranding or scarring which may be postoperative. Degenerative changes in the spine and shoulders. Thoracic scoliosis convex towards the right. No destructive bone lesions.  Calcified dominant nodule in the right lower pole thyroid measuring 2.4 cm diameter. No change since prior study.  Review of the MIP images confirms the above findings.  IMPRESSION: Positive for right lower lobe pulmonary emboli. Atelectasis or infarct in the right lung base. Additional incidental findings as above.  These results were called by telephone at the time of interpretation on 05/24/2015 at 11:29 pm to Dr. Owens Shark, who verbally acknowledged these results.   Electronically Signed   By: Lucienne Capers M.D.   On: 05/24/2015 23:32   Mr Brain Wo Contrast  05/25/2015   CLINICAL DATA:  Dizziness.  Slurred speech.  Unable walk.  CVA.  EXAM: MRI HEAD WITHOUT CONTRAST  TECHNIQUE:  Multiplanar, multiecho pulse sequences of the brain and surrounding structures were obtained without intravenous contrast.  COMPARISON:  CT head without contrast 05/25/2015.  FINDINGS: Mild generalized atrophy is within normal limits for age. No significant white matter disease is present. The diffusion-weighted images demonstrate no acute infarct. No acute hemorrhage or mass lesion is present. The ventricles are proportionate to the degree of atrophy. No significant extra-axial fluid collection is present.  Abnormal signal in the right vertebral artery suggests lower likely occluded flow. Flow is present in the left vertebral artery in bilateral carotid arteries. Bilateral lens replacements are noted. The globes orbits are the intact. The paranasal sinuses and mastoid air cells are clear.  Skullbase is within normal limits. Midline images demonstrate degenerative change in the upper cervical spine. No focal intracranial abnormalities are evident.  IMPRESSION: 1. Moderate generalized atrophy and mild diffuse white matter disease is likely within normal limits for age. 2. Occluded right vertebral artery. The left vertebral artery and basilar artery are patent. 3. No acute intracranial abnormality.   Electronically Signed   By: San Morelle M.D.   On: 05/25/2015 17:02   US Carotid Bilateral  05/25/2015   CLINICAL DATA:  CVA.  History of hypertension and syncopal episode.  EXAM: BILATERAL CAROTID DUPLEX ULTRASOUND  TECHNIQUE: Pearline Cables scale imaging, color Doppler and duplex ultrasound were performed of bilateral carotid and vertebral arteries in the neck.  COMPARISON:  None.  FINDINGS: Criteria: Quantification of carotid stenosis is based on velocity parameters that correlate the residual internal carotid diameter with NASCET-based stenosis levels, using the diameter of the distal internal carotid lumen as  the denominator for stenosis measurement.  The following velocity measurements were obtained:  RIGHT  ICA:   56/17 cm/sec  CCA:  12/75 cm/sec  SYSTOLIC ICA/CCA RATIO:  1.70  DIASTOLIC ICA/CCA RATIO:  1.1  ECA:  42 cm/sec  LEFT  ICA:  80/33 cm/sec  CCA:  01/74 cm/sec  SYSTOLIC ICA/CCA RATIO:  1.4  DIASTOLIC ICA/CCA RATIO:  2.5  ECA:  41 cm/sec  RIGHT CAROTID ARTERY: The right common carotid artery is noted to be mildly tortuous (representative image 2). There is a very minimal amount of eccentric mixed echogenic plaque within the right carotid bulb (image 11), extending to involve the origin and proximal aspects of the right internal carotid artery (image 17), not resulting in elevated peak systolic velocities within the interrogated course of the right internal carotid artery to suggest a hemodynamically significant stenosis.  RIGHT VERTEBRAL ARTERY:  Antegrade flow  LEFT CAROTID ARTERY: There is a minimal amount of eccentric mixed echogenic plaque within the left carotid bulb (image 48), not resulting in elevated peak systolic velocities within the interrogated course of the left internal carotid artery to suggest a hemodynamically significant stenosis.  LEFT VERTEBRAL ARTERY:  Antegrade flow  IMPRESSION: Very minimal amount of bilateral atherosclerotic plaque, right subjectively greater than left, not resulting in a hemodynamically significant stenosis.   Electronically Signed   By: Sandi Mariscal M.D.   On: 05/25/2015 11:42   US Venous Img Lower Bilateral  05/18/2015   CLINICAL DATA:  Bilateral leg pain and edema.  EXAM: BILATERAL LOWER EXTREMITY VENOUS DOPPLER ULTRASOUND  TECHNIQUE: Gray-scale sonography with graded compression, as well as color Doppler and duplex ultrasound were performed to evaluate the lower extremity deep venous systems from the level of the common femoral vein and including the common femoral, femoral, profunda femoral, popliteal and calf veins including the posterior tibial, peroneal and gastrocnemius veins when visible. The superficial great saphenous vein was also interrogated. Spectral Doppler  was utilized to evaluate flow at rest and with distal augmentation maneuvers in the common femoral, femoral and popliteal veins.  COMPARISON:  03/25/2015  FINDINGS: There is occlusive or nearly occlusive thrombus filling the bilateral lower extremity deep venous system, extending from the common femoral veins into the lower popliteal veins (and likely into the legs as the calf veins are not visible). Central propagation cannot be established on this study, but a waveform within the partially thrombosed right common femoral vein shows respiratory phasicity, consistent with IVC patency at least.  The hypoechoic left calf mass, subcutaneous or deep fascial, is again seen - 64 x 14 x 36 mm today. Although still sizable, the mass is more flattened and ovoid in appearance, and hematoma is again favored.  Critical Value/emergent results were called by telephone at the time of interpretation on 05/18/2015 at 9:41 pm to Dr. Lavonia Drafts , who verbally acknowledged these results.  IMPRESSION: 1. Diffuse occlusive deep venous thrombosis in the bilateral lower extremities. 2. Hypoechoic mass in the left calf, 6 cm in maximal dimension, persists since May 2016. There has been some contraction, and resolving hematoma is again favored. Reportedly this mass is palpable, and clinical follow-up is recommended. If no resolution, MRI is recommended.   Electronically Signed   By: Monte Fantasia M.D.   On: 05/18/2015 21:45       Today   Subjective:   Yolanda Wells  feels very weak denies any chest pain or shortness of breath  Objective:   Blood pressure 137/87, pulse 91, temperature  98.7 F (37.1 C), temperature source Oral, resp. rate 18, height 5\' 7"  (1.702 m), weight 98.748 kg (217 lb 11.2 oz), SpO2 100 %.  .  Intake/Output Summary (Last 24 hours) at 05/28/15 1016 Last data filed at 05/28/15 0900  Gross per 24 hour  Intake    240 ml  Output    150 ml  Net     90 ml    Exam VITAL SIGNS: Blood pressure 137/87,  pulse 91, temperature 98.7 F (37.1 C), temperature source Oral, resp. rate 18, height 5\' 7"  (1.702 m), weight 98.748 kg (217 lb 11.2 oz), SpO2 100 %.  GENERAL:  81 y.o.-year-old patient lying in the bed with no acute distress.  EYES: Pupils equal, round, reactive to light and accommodation. No scleral icterus. Extraocular muscles intact.  HEENT: Head atraumatic, normocephalic. Oropharynx and nasopharynx clear.  NECK:  Supple, no jugular venous distention. No thyroid enlargement, no tenderness.  LUNGS: Normal breath sounds bilaterally, no wheezing, rales,rhonchi or crepitation. No use of accessory muscles of respiration.  CARDIOVASCULAR: S1, S2 normal. No murmurs, rubs, or gallops.  ABDOMEN: Soft, nontender, nondistended. Bowel sounds present. No organomegaly or mass.  EXTREMITIES: Bilateral lower extremity edema, cyanosis, or clubbing.  NEUROLOGIC: Cranial nerves II through XII are intact. Muscle strength 5/5 in all extremities. Sensation intact. Gait not checked.  PSYCHIATRIC: The patient is alert and oriented x 3.  SKIN: No obvious rash, lesion, or ulcer.   Data Review     CBC w Diff: Lab Results  Component Value Date   WBC 11.7* 05/28/2015   WBC 6.4 04/10/2015   WBC 3.7 07/08/2014   HGB 10.1* 05/28/2015   HGB 13.8 07/08/2014   HCT 34.2* 05/28/2015   HCT 42.0 07/08/2014   PLT 523* 05/28/2015   PLT 834* 07/08/2014   LYMPHOPCT 19% 05/28/2015   LYMPHOPCT 21.1 07/08/2014   MONOPCT 3% 05/28/2015   MONOPCT 6.1 07/08/2014   EOSPCT 1% 05/28/2015   EOSPCT 2.3 07/08/2014   BASOPCT 1% 05/28/2015   BASOPCT 0.8 07/08/2014   CMP: Lab Results  Component Value Date   NA 136 05/27/2015   NA 137 04/10/2015   NA 144 07/08/2014   K 3.9 05/27/2015   K 3.7 07/08/2014   CL 106 05/27/2015   CL 108* 07/08/2014   CO2 26 05/27/2015   CO2 27 07/08/2014   BUN 9 05/27/2015   BUN 6 04/10/2015   BUN 15 07/08/2014   CREATININE 0.40* 05/27/2015   CREATININE 0.4* 04/10/2015   CREATININE 0.64  07/08/2014   GLU 92 04/10/2015   PROT 5.3* 04/19/2015   PROT 7.1 07/08/2014   ALBUMIN 1.9* 04/19/2015   ALBUMIN 2.9* 07/08/2014   BILITOT 0.4 04/19/2015   ALKPHOS 73 04/19/2015   ALKPHOS 75 07/08/2014   AST 16 04/19/2015   AST 23 07/08/2014   ALT 7* 04/19/2015   ALT 12* 07/08/2014  .  Micro Results No results found for this or any previous visit (from the past 240 hour(s)).      Code Status Orders        Start     Ordered   05/25/15 0835  Full code   Continuous     05/25/15 0834    Advance Directive Documentation        Most Recent Value   Type of Advance Directive  Healthcare Power of Attorney   Pre-existing out of facility DNR order (yellow form or pink MOST form)     "MOST" Form in Place?  Follow-up Information    Follow up with md at snf In 7 days.      Discharge Medications     Medication List    STOP taking these medications        UNABLE TO FIND      TAKE these medications        acetaminophen 325 MG tablet  Commonly known as:  TYLENOL  Take 2 tablets (650 mg total) by mouth every 6 (six) hours as needed for mild pain (or Fever >/= 101).     docusate sodium 100 MG capsule  Commonly known as:  COLACE  Take 1 capsule (100 mg total) by mouth 2 (two) times daily.     feeding supplement (PRO-STAT SUGAR FREE 64) Liqd  Take 30 mLs by mouth 2 (two) times daily.     ondansetron 4 MG tablet  Commonly known as:  ZOFRAN  Take 4 mg by mouth every 6 (six) hours as needed for nausea or vomiting.     oxycodone-acetaminophen 2.5-325 MG per tablet  Commonly known as:  PERCOCET  Take 1 tablet by mouth every 4 (four) hours as needed for pain.     potassium chloride SA 20 MEQ tablet  Commonly known as:  K-DUR,KLOR-CON  Take 20 mEq by mouth daily.     XARELTO STARTER PACK 15 & 20 MG Tbpk  Generic drug:  Rivaroxaban  Take as directed on package: Start with one 15mg  tablet by mouth twice a day with food. On Day 22, switch to one 20mg  tablet  once a day with food.           Total Time in preparing paper work, data evaluation and todays exam - 35 minutes  Dustin Flock M.D on 05/28/2015 at 10:16 AM  [facility]   Office  [phone]

## 2015-05-29 NOTE — Clinical Social Work Note (Addendum)
CSW notified pt, pt's granddaughter and RN that pt would DC today to [facility] via EMS.  CSW signing off unless further needs arise.  CSW was also able to verify that pt's insurance was in network with SNF.  No SNF would accept pt until this was verified.  Verification came late last night after pharmacy at facility was already closed.  Pt will DC today.

## 2015-05-29 NOTE — Progress Notes (Signed)
[facility] NAME: Yolanda Wells    MR#:  [id number]  DATE OF BIRTH:  11-22-1931  SUBJECTIVE:  CHIEF COMPLAINT:   Chief Complaint  Patient presents with  . Shortness of Breath  . Weakness   Patient is awaiting discharge to rehabilitation awaiting offers for bed REVIEW OF SYSTEMS:  CONSTITUTIONAL:  fever, positive fatigue and weakness.  EYES: No blurred or double vision.  EARS, NOSE, AND THROAT: No tinnitus or ear pain.  RESPIRATORY: No cough, positive shortness of breath, wheezing or hemoptysis.  CARDIOVASCULAR: No chest pain, orthopnea, edema.  GASTROINTESTINAL: No nausea, vomiting, diarrhea or abdominal pain.  GENITOURINARY: No dysuria, hematuria.  ENDOCRINE: No polyuria, nocturia,  HEMATOLOGY: No anemia, easy bruising or bleeding SKIN: No rash or lesion. MUSCULOSKELETAL: No joint pain or arthritis.   NEUROLOGIC: No tingling, numbness, weakness.  PSYCHIATRY: No anxiety or depression.   ROS  DRUG ALLERGIES:  No Known Allergies  VITALS:  Blood pressure 127/73, pulse 97, temperature 98.5 F (36.9 C), temperature source Oral, resp. rate 18, height 5\' 7"  (1.702 m), weight 99.02 kg (218 lb 4.8 oz), SpO2 100 %.  PHYSICAL EXAMINATION:  GENERAL:  81 y.o.-year-old patient lying in the bed with no acute distress.  EYES: Pupils equal, round, reactive to light and accommodation. No scleral icterus. Extraocular muscles intact.  HEENT: Head atraumatic, normocephalic. Oropharynx and nasopharynx clear.  NECK:  Supple, no jugular venous distention. No thyroid enlargement, no tenderness.  LUNGS: Normal breath sounds bilaterally, no wheezing, rales,rhonchi or crepitation. No use of accessory muscles of respiration.  CARDIOVASCULAR: S1, S2 normal. No murmurs, rubs, or gallops.  ABDOMEN: Soft, nontender, nondistended. Bowel sounds present. No organomegaly or mass. Colostomy bag in place. EXTREMITIES: No pedal edema, cyanosis, or  clubbing. Right foot trans metatarsal amputation present. NEUROLOGIC: Cranial nerves II through XII are intact. Muscle strength 3/5 in lower extremities. Sensation intact. Gait not checked.  PSYCHIATRIC: The patient is alert and oriented x 3.  SKIN: No obvious rash, lesion, or ulcer.   Physical Exam LABORATORY PANEL:   CBC  Recent Labs Lab 05/28/15 0903  WBC 11.7*  HGB 10.1*  HCT 34.2*  PLT 523*   ------------------------------------------------------------------------------------------------------------------  Chemistries   Recent Labs Lab 05/27/15 0543  NA 136  K 3.9  CL 106  CO2 26  GLUCOSE 96  BUN 9  CREATININE 0.40*  CALCIUM 7.9*   ------------------------------------------------------------------------------------------------------------------  Cardiac Enzymes  Recent Labs Lab 05/24/15 2344  TROPONINI <0.03   ------------------------------------------------------------------------------------------------------------------  RADIOLOGY:  No results found.  ASSESSMENT AND PLAN:   This is an 81 year old African American female admitted for pulmonary embolism and cerebellar infarct. 1.speech changes appears to be chronic , MRI of the brain negative for acute CVA 2. Pulmonary embolism: Bilateral; patient is on Xarelto. (The patient had been on treatment for DVT previously). She is status post IVC filter in light of her having a massive GI bleed recently requiring hemicolectomy 3. Hypertension: Controlled at this time 4.  Polycythemia vera was on hydroxyurea 5. GI prophylaxis: None   All the records are reviewed and case discussed with Care Management/Social Workerr. Management plans discussed with the patient, family and they are in agreement.  CODE STATUS: full  TOTAL TIME TAKING CARE OF THIS PATIENT: 25 minutes.      Dustin Flock M.D on 05/29/2015   Between 7am to 6pm - Pager - [phone]  After 6pm go to [email or web address] - password [email or web address]  [facility]  Office  [phone]  CC: Primary care physician; Pcp Not In System

## 2015-05-29 NOTE — Progress Notes (Signed)
Patient is discharge to snf in a stable condition, report given to floor nurse , colostomy intact with belt inplace, transported by EMS.

## 2015-05-30 ENCOUNTER — Encounter: Payer: Self-pay | Admitting: Vascular Surgery

## 2015-06-20 ENCOUNTER — Encounter: Payer: Self-pay | Admitting: Surgery

## 2015-06-20 ENCOUNTER — Ambulatory Visit (INDEPENDENT_AMBULATORY_CARE_PROVIDER_SITE_OTHER): Payer: Medicare Other | Admitting: Surgery

## 2015-06-20 VITALS — BP 125/81 | HR 71 | Temp 98.4°F | Ht 67.0 in | Wt 202.0 lb

## 2015-06-20 DIAGNOSIS — I2699 Other pulmonary embolism without acute cor pulmonale: Secondary | ICD-10-CM

## 2015-06-20 DIAGNOSIS — K5731 Diverticulosis of large intestine without perforation or abscess with bleeding: Secondary | ICD-10-CM

## 2015-06-20 NOTE — Progress Notes (Signed)
Surgery clinic   The patient is almost 3 months status post emergent Hartman's procedure for massive lower GI bleeding. She had a prolonged hospitalization.   He is currently at [facility]. 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   Over the last 3 days she's had some nausea. There has been episodes of emesis. A plain x-ray obtained at the nursing home on Friday was reviewed and report form and found to be unremarkable. She is accompanied by her granddaughter. The patient has had continued abdominal pain below her  Ostomy on the left side. She is continuing to eat.    On physical examination she is in no distress. She is very frail and weak. Max assist to get her back in the wheelchair.   Her abdomen is soft and nontender her ostomy is functional.   Impression the patient is in no condition for consideration of colostomy reversal. I believe that her nausea may be related to a cerebellar stroke incident.   We'll see her back in 3-6 months.

## 2015-06-20 NOTE — Patient Instructions (Addendum)
Due to your recent stroke and blood clot, you are not currently a candidate for colostomy closure.  We have reviewed the recent x-ray done at [facility] and don't see a source of your pain or nausea.  Please follow-up with your PCP or Neurologist regarding your nausea and abdominal pain.  Please follow-up in 6 months or after to have another discussion about reversing your colostomy if this is something that you are still wanting to consider.

## 2015-07-15 ENCOUNTER — Inpatient Hospital Stay
Admission: EM | Admit: 2015-07-15 | Discharge: 2015-07-20 | DRG: 314 | Disposition: A | Discharge status: Home Health | Payer: Medicare Other | Attending: Specialist | Admitting: Specialist

## 2015-07-15 ENCOUNTER — Encounter: Payer: Self-pay | Admitting: Emergency Medicine

## 2015-07-15 ENCOUNTER — Emergency Department: Payer: Medicare Other

## 2015-07-15 DIAGNOSIS — R918 Other nonspecific abnormal finding of lung field: Secondary | ICD-10-CM

## 2015-07-15 DIAGNOSIS — I10 Essential (primary) hypertension: Secondary | ICD-10-CM | POA: Insufficient documentation

## 2015-07-15 DIAGNOSIS — Z79891 Long term (current) use of opiate analgesic: Secondary | ICD-10-CM | POA: Diagnosis not present

## 2015-07-15 DIAGNOSIS — Z86718 Personal history of other venous thrombosis and embolism: Secondary | ICD-10-CM | POA: Diagnosis not present

## 2015-07-15 DIAGNOSIS — Z8249 Family history of ischemic heart disease and other diseases of the circulatory system: Secondary | ICD-10-CM

## 2015-07-15 DIAGNOSIS — Y831 Surgical operation with implant of artificial internal device as the cause of abnormal reaction of the patient, or of later complication, without mention of misadventure at the time of the procedure: Secondary | ICD-10-CM | POA: Diagnosis present

## 2015-07-15 DIAGNOSIS — N3000 Acute cystitis without hematuria: Secondary | ICD-10-CM | POA: Diagnosis present

## 2015-07-15 DIAGNOSIS — Z96643 Presence of artificial hip joint, bilateral: Secondary | ICD-10-CM | POA: Diagnosis present

## 2015-07-15 DIAGNOSIS — E871 Hypo-osmolality and hyponatremia: Secondary | ICD-10-CM | POA: Diagnosis present

## 2015-07-15 DIAGNOSIS — Z933 Colostomy status: Secondary | ICD-10-CM

## 2015-07-15 DIAGNOSIS — E876 Hypokalemia: Secondary | ICD-10-CM | POA: Diagnosis present

## 2015-07-15 DIAGNOSIS — D45 Polycythemia vera: Secondary | ICD-10-CM | POA: Diagnosis present

## 2015-07-15 DIAGNOSIS — J189 Pneumonia, unspecified organism: Secondary | ICD-10-CM | POA: Diagnosis present

## 2015-07-15 DIAGNOSIS — D473 Essential (hemorrhagic) thrombocythemia: Secondary | ICD-10-CM | POA: Diagnosis present

## 2015-07-15 DIAGNOSIS — Z825 Family history of asthma and other chronic lower respiratory diseases: Secondary | ICD-10-CM

## 2015-07-15 DIAGNOSIS — T82868A Thrombosis of vascular prosthetic devices, implants and grafts, initial encounter: Principal | ICD-10-CM | POA: Diagnosis present

## 2015-07-15 DIAGNOSIS — M1990 Unspecified osteoarthritis, unspecified site: Secondary | ICD-10-CM | POA: Diagnosis present

## 2015-07-15 DIAGNOSIS — Z90710 Acquired absence of both cervix and uterus: Secondary | ICD-10-CM | POA: Diagnosis not present

## 2015-07-15 DIAGNOSIS — Z9889 Other specified postprocedural states: Secondary | ICD-10-CM | POA: Diagnosis not present

## 2015-07-15 DIAGNOSIS — I82409 Acute embolism and thrombosis of unspecified deep veins of unspecified lower extremity: Secondary | ICD-10-CM | POA: Diagnosis present

## 2015-07-15 DIAGNOSIS — Z9049 Acquired absence of other specified parts of digestive tract: Secondary | ICD-10-CM | POA: Diagnosis present

## 2015-07-15 DIAGNOSIS — R609 Edema, unspecified: Secondary | ICD-10-CM | POA: Diagnosis present

## 2015-07-15 DIAGNOSIS — L03311 Cellulitis of abdominal wall: Secondary | ICD-10-CM | POA: Diagnosis present

## 2015-07-15 DIAGNOSIS — Z833 Family history of diabetes mellitus: Secondary | ICD-10-CM | POA: Diagnosis not present

## 2015-07-15 DIAGNOSIS — Z79899 Other long term (current) drug therapy: Secondary | ICD-10-CM

## 2015-07-15 DIAGNOSIS — I82220 Acute embolism and thrombosis of inferior vena cava: Secondary | ICD-10-CM | POA: Diagnosis present

## 2015-07-15 DIAGNOSIS — Z89422 Acquired absence of other left toe(s): Secondary | ICD-10-CM

## 2015-07-15 DIAGNOSIS — Z808 Family history of malignant neoplasm of other organs or systems: Secondary | ICD-10-CM | POA: Diagnosis not present

## 2015-07-15 DIAGNOSIS — N390 Urinary tract infection, site not specified: Secondary | ICD-10-CM | POA: Diagnosis present

## 2015-07-15 HISTORY — DX: Edema, unspecified: R60.9

## 2015-07-15 HISTORY — DX: Unspecified osteoarthritis, unspecified site: M19.90

## 2015-07-15 HISTORY — DX: Polycythemia vera: D45

## 2015-07-15 LAB — COMPREHENSIVE METABOLIC PANEL
ALK PHOS: 112 U/L (ref 38–126)
ALT: 10 U/L — AB (ref 14–54)
ANION GAP: 7 (ref 5–15)
AST: 24 U/L (ref 15–41)
Albumin: 2.5 g/dL — ABNORMAL LOW (ref 3.5–5.0)
BUN: 9 mg/dL (ref 6–20)
CHLORIDE: 104 mmol/L (ref 101–111)
CO2: 28 mmol/L (ref 22–32)
Calcium: 8.8 mg/dL — ABNORMAL LOW (ref 8.9–10.3)
Creatinine, Ser: 0.73 mg/dL (ref 0.44–1.00)
GFR calc Af Amer: >60 mL/min (ref >60)
GFR calc non Af Amer: >60 mL/min (ref >60)
Glucose, Bld: 114 mg/dL — ABNORMAL HIGH (ref 65–99)
POTASSIUM: 4.1 mmol/L (ref 3.5–5.1)
SODIUM: 139 mmol/L (ref 135–145)
Total Bilirubin: 0.4 mg/dL (ref 0.3–1.2)
Total Protein: 7 g/dL (ref 6.5–8.1)

## 2015-07-15 LAB — CBC WITH DIFFERENTIAL/PLATELET
BASOS ABS: 0.1 10*3/uL (ref 0–0.1)
EOS ABS: 0.3 10*3/uL (ref 0–0.7)
HCT: 43.8 % (ref 35.0–47.0)
Hemoglobin: 13.4 g/dL (ref 12.0–16.0)
Lymphocytes Relative: 8 %
Lymphs Abs: 1.9 10*3/uL (ref 1.0–3.6)
MCH: 24.3 pg — ABNORMAL LOW (ref 26.0–34.0)
MCHC: 30.6 g/dL — ABNORMAL LOW (ref 32.0–36.0)
MCV: 79.5 fL — ABNORMAL LOW (ref 80.0–100.0)
Monocytes Absolute: 0.7 10*3/uL (ref 0.2–0.9)
Monocytes Relative: 3 %
Neutro Abs: 20.4 10*3/uL — ABNORMAL HIGH (ref 1.4–6.5)
PLATELETS: 889 10*3/uL — AB (ref 150–440)
RBC: 5.51 MIL/uL — AB (ref 3.80–5.20)
RDW: 26.9 % — ABNORMAL HIGH (ref 11.5–14.5)
WBC: 23.5 10*3/uL — AB (ref 3.6–11.0)

## 2015-07-15 LAB — URINALYSIS COMPLETE WITH MICROSCOPIC (ARMC ONLY)
Bilirubin Urine: NEGATIVE
GLUCOSE, UA: NEGATIVE mg/dL
Hgb urine dipstick: NEGATIVE
Ketones, ur: NEGATIVE mg/dL
NITRITE: POSITIVE — AB
Protein, ur: NEGATIVE mg/dL
Specific Gravity, Urine: 1.013 (ref 1.005–1.030)
pH: 7 (ref 5.0–8.0)

## 2015-07-15 LAB — TROPONIN I: Troponin I: 0.04 ng/mL — ABNORMAL HIGH (ref <0.031)

## 2015-07-15 LAB — LACTIC ACID, PLASMA: LACTIC ACID, VENOUS: 1.1 mmol/L (ref 0.5–2.0)

## 2015-07-15 LAB — LIPASE, BLOOD: LIPASE: 12 U/L — AB (ref 22–51)

## 2015-07-15 MED ORDER — VANCOMYCIN HCL IN DEXTROSE 1-5 GM/200ML-% IV SOLN
1000.0000 mg | Freq: Once | INTRAVENOUS | Status: AC
  Administered 2015-07-15: 1000 mg via INTRAVENOUS
  Filled 2015-07-15: qty 200

## 2015-07-15 MED ORDER — HEPARIN BOLUS VIA INFUSION
5000.0000 [IU] | Freq: Once | INTRAVENOUS | Status: AC
  Administered 2015-07-15: 5000 [IU] via INTRAVENOUS
  Filled 2015-07-15: qty 5000

## 2015-07-15 MED ORDER — HEPARIN (PORCINE) IN NACL 100-0.45 UNIT/ML-% IJ SOLN
1400.0000 [IU]/h | Freq: Once | INTRAMUSCULAR | Status: AC
  Administered 2015-07-15: 1400 [IU]/h via INTRAVENOUS
  Filled 2015-07-15: qty 250

## 2015-07-15 MED ORDER — IOHEXOL 240 MG/ML SOLN: 25.0000 mL | Freq: Once | INTRAMUSCULAR | Status: DC | PRN

## 2015-07-15 MED ORDER — IOHEXOL 300 MG/ML  SOLN
100.0000 mL | Freq: Once | INTRAMUSCULAR | Status: AC | PRN
Start: 2015-07-15 — End: 2015-07-15
  Administered 2015-07-15: 100 mL via INTRAVENOUS

## 2015-07-15 MED ORDER — HEPARIN SODIUM (PORCINE) 5000 UNIT/ML IJ SOLN: 60.0000 [IU]/kg | Freq: Once | INTRAMUSCULAR | Status: DC

## 2015-07-15 MED ORDER — PIPERACILLIN-TAZOBACTAM 3.375 G IVPB
3.3750 g | Freq: Once | INTRAVENOUS | Status: AC
  Administered 2015-07-15: 3.375 g via INTRAVENOUS
  Filled 2015-07-15: qty 50

## 2015-07-15 NOTE — ED Notes (Signed)
Pt transported to CT ?

## 2015-07-15 NOTE — ED Provider Notes (Addendum)
[facility] Emergency Department Provider Note  ____________________________________________  Time seen: Approximately 10:24 PM  I have reviewed the triage vital signs and the nursing notes.   HISTORY  Chief Complaint Abdominal Pain    HPI Yolanda Wells is a 81 y.o. female patient reports she's had abdominal pain ever since she had a colostomy placed. However the abdominal pain has gotten worse in the last few days. She also reports she has had some redness around the colostomy site and that is also painful. She says she is somewhat nauseated and did not admit to any vomiting. Ostomy seems to putting out well. Palpation or touching the abdomen makes the pain worse nothing else really seems to do so. Pain is she reports "bad".   Past Medical History  Diagnosis Date  . Hypertension   . Deep venous thrombosis     right lower extremity  . History of hysterectomy   . History of bilateral hip replacements     Patient Active Problem List   Diagnosis Date Noted  . Pulmonary emboli 05/25/2015  . Diverticulosis of colon with hemorrhage 04/02/2015    Past Surgical History  Procedure Laterality Date  . Toe amputation      right  . Joint replacement      Left and Right Hip  . Abdominal hysterectomy    . Peripheral vascular catheterization N/A 04/02/2015    Procedure: Visceral Angiography;  Surgeon: Algernon Huxley, MD;  Location: [place] [facility];  Service: Cardiovascular;  Laterality: N/A;  . Peripheral vascular catheterization N/A 04/02/2015    Procedure: Visceral Artery Intervention;  Surgeon: Algernon Huxley, MD;  Location: [place] [facility];  Service: Cardiovascular;  Laterality: N/A;  . Colectomy with colostomy creation/hartmann procedure N/A 04/03/2015    Procedure: COLECTOMY WITH COLOSTOMY CREATION/HARTMANN PROCEDURE;  Surgeon: Sherri Rad, MD;  Location: [facility] OR[facility];  Service: General;  Laterality: N/A;  . Cardiac catheterization Right 04/03/2015     Procedure: CENTRAL LINE INSERTION;  Surgeon: Sherri Rad, MD;  Location: [facility] OR[facility];  Service: General;  Laterality: Right;  . Peripheral vascular catheterization N/A 05/25/2015    Procedure: IVC Filter Insertion;  Surgeon: Katha Cabal, MD;  Location: [place] [facility];  Service: Cardiovascular;  Laterality: N/A;    Current Outpatient Rx  Name  Route  Sig  Dispense  Refill  . docusate sodium (COLACE) 100 MG capsule   Oral   Take 100 mg by mouth 2 (two) times daily.         . ferrous sulfate 325 (65 FE) MG tablet   Oral   Take 325 mg by mouth 2 (two) times daily with a meal.         . folic acid (FOLVITE) 010 MCG tablet   Oral   Take 400 mcg by mouth daily.         . furosemide (LASIX) 20 MG tablet   Oral   Take 20 mg by mouth daily.         . ondansetron (ZOFRAN) 4 MG tablet   Oral   Take 4 mg by mouth every 6 (six) hours as needed for nausea or vomiting.         [name] oxycodone-acetaminophen (PERCOCET) 2.5-325 MG per tablet   Oral   Take 1 tablet by mouth every 4 (four) hours as needed for pain.   30 tablet   0   . potassium chloride SA (K-DUR,KLOR-CON) 20 MEQ tablet   Oral   Take  20 mEq by mouth daily.         . rivaroxaban (XARELTO) 20 MG TABS tablet   Oral   Take 20 mg by mouth every evening.         . simethicone (MYLICON) 80 MG chewable tablet   Oral   Chew 30 mg by mouth 4 (four) times daily -  before meals and at bedtime.           Allergies Review of patient's allergies indicates no known allergies.  Family History  Problem Relation Age of Onset  . Heart attack Father   . Hypertension    . Arthritis-Osteo      Social History Social History  Substance Use Topics  . Smoking status: Never Smoker   . Smokeless tobacco: Never Used  . Alcohol Use: No    Review of Systems Constitutional: No fever/chills Eyes: No visual changes. ENT: No sore throat. Cardiovascular: Denies chest pain. Respiratory: Denies shortness of  breath. Gastrointestinal: See history of present illness Genitourinary: Negative for dysuria. Musculoskeletal: Negative for back pain. Skin: Negative for rash. Neurological: Negative for headaches, focal weakness or numbness.  10-point ROS otherwise negative.  ____________________________________________   PHYSICAL EXAM:  VITAL SIGNS: ED Triage Vitals  Enc Vitals Group     BP 07/15/15 2000 137/82 mmHg     Pulse Rate 07/15/15 2000 100     Resp 07/15/15 2000 21     Temp 07/15/15 2000 97.7 F (36.5 C)     Temp Source 07/15/15 2000 Oral     SpO2 07/15/15 2000 95 %     Weight --      Height --      Head Cir --      Peak Flow --      Pain Score 07/15/15 1957 7     Pain Loc --      Pain Edu? --      Excl. in [place]? --     Constitutional: Alert and oriented. Well appearing and in no acute distress. Eyes: Conjunctivae are normal. PERRL. EOMI. Head: Atraumatic. Nose: No congestion/rhinnorhea. Mouth/Throat: Mucous membranes are moist.  Oropharynx non-erythematous. Neck: No stridor.  Cardiovascular: Normal rate, regular rhythm. Grossly normal heart sounds.  Good peripheral circulation. Respiratory: Normal respiratory effort.  No retractions. Lungs CTAB. Gastrointestinal: Soft and nontender except for in the area on the anterior abdominal wall which appears to be faintly red. It seems to also be slightly warmer than the rest T abdominal wall and again is tender. The ostomy appears to be functioning well stool is greenish in color and not liquid. No distention. No abdominal bruits. No CVA tenderness. Musculoskeletal: No lower extremity tenderness  No joint effusions. Neurologic:  Normal speech and language. No gross focal neurologic deficits are appreciated. No gait instability. Skin:  Skin is warm, dry and intact. No rash noted. Psychiatric: Mood and affect are normal. Speech and behavior are normal.  ____________________________________________   LABS (all labs ordered are listed,  but only abnormal results are displayed)  Labs Reviewed  COMPREHENSIVE METABOLIC PANEL - Abnormal; Notable for the following:    Glucose, Bld 114 (*)    Calcium 8.8 (*)    Albumin 2.5 (*)    ALT 10 (*)    All other components within normal limits  LIPASE, BLOOD - Abnormal; Notable for the following:    Lipase 12 (*)    All other components within normal limits  TROPONIN I - Abnormal; Notable for the following:  Troponin I 0.04 (*)    All other components within normal limits  CBC WITH DIFFERENTIAL/PLATELET - Abnormal; Notable for the following:    WBC 23.5 (*)    RBC 5.51 (*)    MCV 79.5 (*)    MCH 24.3 (*)    MCHC 30.6 (*)    RDW 26.9 (*)    Platelets 889 (*)    Neutro Abs 20.4 (*)    All other components within normal limits  URINALYSIS COMPLETEWITH MICROSCOPIC ([facility] ONLY) - Abnormal; Notable for the following:    Color, Urine YELLOW (*)    APPearance HAZY (*)    Nitrite POSITIVE (*)    Leukocytes, UA 1+ (*)    Bacteria, UA MANY (*)    Squamous Epithelial / LPF 0-5 (*)    All other components within normal limits  URINE CULTURE  CULTURE, BLOOD (ROUTINE X 2)  CULTURE, BLOOD (ROUTINE X 2)  LACTIC ACID, PLASMA  LACTIC ACID, PLASMA   ____________________________________________  EKG  EKG is pending ____________________________________________  RADIOLOGY  Radiologist calls report that the patient has what appears to be a blood clot above her IVC filter. She also has an infiltrate or infarct in the lung. I discussed the white count with him this could be due to either to the to an infarct or pneumonia. ____________________________________________   PROCEDURES   ____________________________________________   INITIAL IMPRESSION / ASSESSMENT AND PLAN / ED COURSE  Pertinent labs & imaging results that were available during my care of the patient were reviewed by me and considered in my medical decision making (see chart for  details).   ____________________________________________   FINAL CLINICAL IMPRESSION(S) / ED DIAGNOSES  Final diagnoses:  Pulmonary infiltrate   patient also has diagnoses of blood clot in the inferior vena cava above her IVC filter.    Nena Polio, MD 07/15/15 [phone] Radiologist feels it would be best to wait for a further CT angiogram of the lungs kill the contrast clears out of her system probably about 6 hours. He reports that doing another CT with contrast at this time the previously given contrast could obscure a clot. He would recommend anticoagulating her one way or the other since she has what appears to be the clot above her IVC filter.  Nena Polio, MD 07/15/15 [phone]

## 2015-07-15 NOTE — ED Notes (Signed)
MD at bedside. 

## 2015-07-15 NOTE — ED Notes (Signed)
PO contrast provided to pt.

## 2015-07-15 NOTE — ED Notes (Signed)
Pt completed PO contrast. CT notified.

## 2015-07-15 NOTE — ED Notes (Signed)
Pt arrives via EMS from home with c/o abdominal pain around colostomy site.  Pt had colostomy placed approx 3 months ago.  Pt also c/o nausea and increasing generalized weakness.  Pt describes pain at site as sharp and 7/10.  Pt a/ox4 and NAD upon arrival to ED.

## 2015-07-15 NOTE — Progress Notes (Signed)
ANTICOAGULATION CONSULT NOTE - Initial Consult  Pharmacy Consult for heparin Indication: DVT  No Known Allergies  Patient Measurements: Weight: 218 lb 11.2 oz (99.202 kg) Heparin Dosing Weight: 83.5 kg  Vital Signs: Temp: 97.7 F (36.5 C) (09/04 2000) Temp Source: Oral (09/04 2000) BP: 125/80 mmHg (09/04 2030) Pulse Rate: 95 (09/04 2030)  Labs:  Recent Labs  07/15/15 2014  HGB 13.4  HCT 43.8  PLT 889*  CREATININE 0.73  TROPONINI 0.04*    Estimated Creatinine Clearance: 66.7 mL/min (by C-G formula based on Cr of 0.73).   Medical History: Past Medical History  Diagnosis Date  . Hypertension   . Deep venous thrombosis     right lower extremity  . History of hysterectomy   . History of bilateral hip replacements     Medications:  Infusions:  . piperacillin-tazobactam (ZOSYN)  IV 3.375 g (07/15/15 2252)  . vancomycin      Assessment: 83 yof cc abdominal pain with radiologic evidence of blood clot in inferior vena cava above her IVC filter.   Goal of Therapy:  Heparin level 0.3-0.7 units/ml Monitor platelets by anticoagulation protocol: Yes   Plan:  Give 5000 units bolus x 1 Start heparin infusion at 1400 units/hr Check anti-Xa level in 8 hours and daily while on heparin Continue to monitor H&H and platelets  Laural Benes, Pharm.D.  Clinical Pharmacist 07/15/2015,11:16 PM

## 2015-07-16 ENCOUNTER — Inpatient Hospital Stay: Payer: Medicare Other

## 2015-07-16 DIAGNOSIS — N390 Urinary tract infection, site not specified: Secondary | ICD-10-CM | POA: Diagnosis present

## 2015-07-16 DIAGNOSIS — L03311 Cellulitis of abdominal wall: Secondary | ICD-10-CM

## 2015-07-16 LAB — CBC
HCT: 40.8 % (ref 35.0–47.0)
Hemoglobin: 12.7 g/dL (ref 12.0–16.0)
MCH: 24.5 pg — ABNORMAL LOW (ref 26.0–34.0)
MCHC: 31.1 g/dL — AB (ref 32.0–36.0)
MCV: 78.8 fL — AB (ref 80.0–100.0)
PLATELETS: 854 10*3/uL — AB (ref 150–440)
RBC: 5.17 MIL/uL (ref 3.80–5.20)
RDW: 26.6 % — AB (ref 11.5–14.5)
WBC: 22.7 10*3/uL — AB (ref 3.6–11.0)

## 2015-07-16 LAB — BASIC METABOLIC PANEL
ANION GAP: 5 (ref 5–15)
BUN: 7 mg/dL (ref 6–20)
CALCIUM: 8.3 mg/dL — AB (ref 8.9–10.3)
CO2: 26 mmol/L (ref 22–32)
CREATININE: 0.57 mg/dL (ref 0.44–1.00)
Chloride: 103 mmol/L (ref 101–111)
GFR calc Af Amer: >60 mL/min (ref >60)
GLUCOSE: 95 mg/dL (ref 65–99)
Potassium: 3.9 mmol/L (ref 3.5–5.1)
Sodium: 134 mmol/L — ABNORMAL LOW (ref 135–145)

## 2015-07-16 LAB — HEPARIN LEVEL (UNFRACTIONATED)
HEPARIN UNFRACTIONATED: 0.81 [IU]/mL — AB (ref 0.30–0.70)
Heparin Unfractionated: 0.6 IU/mL (ref 0.30–0.70)

## 2015-07-16 LAB — TROPONIN I: Troponin I: <0.03 ng/mL (ref <0.031)

## 2015-07-16 MED ORDER — ACETAMINOPHEN 650 MG RE SUPP: 650.0000 mg | Freq: Four times a day (QID) | RECTAL | Status: DC | PRN

## 2015-07-16 MED ORDER — SODIUM CHLORIDE 0.9 % IJ SOLN
3.0000 mL | Freq: Two times a day (BID) | INTRAMUSCULAR | Status: DC
  Administered 2015-07-16 – 2015-07-20 (×8): 3 mL via INTRAVENOUS

## 2015-07-16 MED ORDER — FUROSEMIDE 20 MG PO TABS
20.0000 mg | ORAL_TABLET | Freq: Every day | ORAL | Status: DC
  Administered 2015-07-16 – 2015-07-20 (×5): 20 mg via ORAL
  Filled 2015-07-16 (×5): qty 1

## 2015-07-16 MED ORDER — ONDANSETRON HCL 4 MG/2ML IJ SOLN: 4.0000 mg | Freq: Four times a day (QID) | INTRAMUSCULAR | Status: DC | PRN

## 2015-07-16 MED ORDER — DOCUSATE SODIUM 100 MG PO CAPS: 100.0000 mg | ORAL_CAPSULE | Freq: Two times a day (BID) | ORAL | Status: DC | PRN

## 2015-07-16 MED ORDER — ONDANSETRON HCL 4 MG PO TABS: 4.0000 mg | ORAL_TABLET | Freq: Four times a day (QID) | ORAL | Status: DC | PRN

## 2015-07-16 MED ORDER — IOHEXOL 350 MG/ML SOLN
75.0000 mL | Freq: Once | INTRAVENOUS | Status: AC | PRN
  Administered 2015-07-16: 75 mL via INTRAVENOUS

## 2015-07-16 MED ORDER — OXYCODONE HCL 5 MG PO TABS: 2.5000 mg | ORAL_TABLET | ORAL | Status: DC | PRN

## 2015-07-16 MED ORDER — SIMETHICONE 80 MG PO CHEW: 40.0000 mg | CHEWABLE_TABLET | Freq: Four times a day (QID) | ORAL | Status: DC | PRN

## 2015-07-16 MED ORDER — ACETAMINOPHEN 325 MG PO TABS
650.0000 mg | ORAL_TABLET | Freq: Four times a day (QID) | ORAL | Status: DC | PRN
  Administered 2015-07-18 – 2015-07-19 (×3): 650 mg via ORAL
  Filled 2015-07-16 (×4): qty 2

## 2015-07-16 MED ORDER — VANCOMYCIN HCL IN DEXTROSE 1-5 GM/200ML-% IV SOLN
1000.0000 mg | Freq: Two times a day (BID) | INTRAVENOUS | Status: DC
  Administered 2015-07-16 – 2015-07-17 (×3): 1000 mg via INTRAVENOUS
  Filled 2015-07-16 (×5): qty 200

## 2015-07-16 MED ORDER — OXYCODONE-ACETAMINOPHEN 2.5-325 MG PO TABS: 1.0000 | ORAL_TABLET | ORAL | Status: DC | PRN

## 2015-07-16 MED ORDER — ACETAMINOPHEN 325 MG PO TABS: 325.0000 mg | ORAL_TABLET | ORAL | Status: DC | PRN

## 2015-07-16 MED ORDER — HEPARIN (PORCINE) IN NACL 100-0.45 UNIT/ML-% IJ SOLN
1250.0000 [IU]/h | Freq: Once | INTRAMUSCULAR | Status: AC
  Administered 2015-07-16: 16:00:00 1250 [IU]/h via INTRAVENOUS
  Filled 2015-07-16: qty 250

## 2015-07-16 MED ORDER — PIPERACILLIN-TAZOBACTAM 3.375 G IVPB
3.3750 g | Freq: Three times a day (TID) | INTRAVENOUS | Status: DC
  Administered 2015-07-16 – 2015-07-17 (×4): 3.375 g via INTRAVENOUS
  Filled 2015-07-16 (×8): qty 50

## 2015-07-16 NOTE — Plan of Care (Signed)
Problem: Discharge Progression Outcomes Goal: Pain controlled with appropriate interventions No pain Goal: Hemodynamically stable Outcome: Progressing Colostomy  chaanged today. Site looks good.  Stoma  Pink/ viable and  Intact.   No reddnedd noted around  Stoma site. Dr  Marina Gravel and vaccular  md saw today. abx cont Goal: Tolerating diet Was npo now  On cliqs and tol well.

## 2015-07-16 NOTE — Progress Notes (Signed)
ANTICOAGULATION CONSULT NOTE - Initial Consult  Pharmacy Consult for heparin Indication: DVT  No Known Allergies  Patient Measurements: Height: 5\' 7"  (170.2 cm) Weight: 213 lb 14.4 oz (97.024 kg) IBW/kg (Calculated) : 61.6 Heparin Dosing Weight: 83.5 kg  Vital Signs: Temp: 97.8 F (36.6 C) (09/05 0449) Temp Source: Oral (09/05 0449) BP: 129/68 mmHg (09/05 0449) Pulse Rate: 87 (09/05 0449)  Labs:  Recent Labs  07/15/15 2014 07/16/15 0815  HGB 13.4 12.7  HCT 43.8 40.8  PLT 889* 854*  HEPARINUNFRC  --  0.81*  CREATININE 0.73 0.57  TROPONINI 0.04*  --     Estimated Creatinine Clearance: 66 mL/min (by C-G formula based on Cr of 0.57).   Medical History: Past Medical History  Diagnosis Date  . Hypertension   . Deep venous thrombosis     right lower extremity  . History of hysterectomy   . History of bilateral hip replacements   . Polycythemia vera   . Dependent edema   . Arthritis     Medications:  Infusions:     Assessment: 83 yof cc abdominal pain with radiologic evidence of blood clot in inferior vena cava above her IVC filter.   Goal of Therapy:  Heparin level 0.3-0.7 units/ml Monitor platelets by anticoagulation protocol: Yes   Plan:  Heparin level is above goal so will decrease infusion to 1250 units/hr and recheck a HL in 8 hours.    Ulice Dash D, Pharm.D.  Clinical Pharmacist 07/16/2015,9:41 AM

## 2015-07-16 NOTE — Progress Notes (Signed)
Patient ID: Yolanda Wells, female   DOB: [birth year], 81 y.o.   MRN: [id number]   Surgery  Asked to evaluate for cellulitis of abdominal wall and evaluation of CT scan.  She is well-known to me from prior sigmoid colectomy and Hartmann's procedure in May of this year for uncontrolled lower GI bleeding not responsive to embolization. She is admitted with embolic phenomenon. Consult was placed for evaluation of cellulitis abdominal wall.  He was placed on vancomycin and Zosyn.  Examination demonstrates a well-healed midline incision. Left lower quadrant colostomy which is functional. Ostomy appliance in good order. I can appreciate no evidence of abdominal wall cellulitis.  Review of CT scan demonstrates no gastrointestinal pathology seen within the abdomen.  Impression I see no evidence this time of cellulitis. There are no immediate plans for colostomy reversal given the patient's advanced age and level of debilitation based on her last office visit several weeks ago. I will consult with the nurse regarding change in the osteotomy sure that there is no cellulitis seen underneath the wafer.   Call with any questions.

## 2015-07-16 NOTE — H&P (Addendum)
[facility] NAME: Yolanda Wells    MR#:  [id number]  DATE OF BIRTH:  10-20-1932  DATE OF ADMISSION:  07/15/2015  PRIMARY CARE PHYSICIAN: Renata Caprice, DO   REQUESTING/REFERRING PHYSICIAN: Cinda Quest, MD  CHIEF COMPLAINT:   Chief Complaint  Patient presents with  . Abdominal Pain    HISTORY OF PRESENT ILLNESS:  Yolanda Wells  is a 81 y.o. female who presents with increasing abdominal pain around the site of her ostomy, and dysuria. Patient states that she's been having abdominal pain since she had colostomy, but that is gotten worse over the last few days and she now has some erythema of her skin around the ostomy site. She denies overt fevers or chills at home. She has been having some dysuria for the past few days as well. On evaluation in the ED she was found to have an elevated white blood cell count, and on CT abdomen and pelvis was found to have some filling defect concerning for clot/DVT formation around her IVC filter. She also has some lower lobe findings in her lungs on the same CT scan concerning for possible pneumonia versus some infarcted lung. Patient has not had any cough, hemoptysis or respiratory symptoms. Hospitalists were called for admission for abdominal wall cellulitis, UTI, and further investigate this question of clot around her IVC filter and a question of possible small PE versus pneumonia.  PAST MEDICAL HISTORY:   Past Medical History  Diagnosis Date  . Hypertension   . Deep venous thrombosis     right lower extremity  . History of hysterectomy   . History of bilateral hip replacements   . Polycythemia vera   . Dependent edema   . Arthritis     PAST SURGICAL HISTORY:   Past Surgical History  Procedure Laterality Date  . Toe amputation      right  . Joint replacement      Left and Right Hip  . Abdominal hysterectomy    . Peripheral vascular catheterization N/A 04/02/2015    Procedure:  Visceral Angiography;  Surgeon: Algernon Huxley, MD;  Location: [facility];  Service: Cardiovascular;  Laterality: N/A;  . Peripheral vascular catheterization N/A 04/02/2015    Procedure: Visceral Artery Intervention;  Surgeon: Algernon Huxley, MD;  Location: [facility];  Service: Cardiovascular;  Laterality: N/A;  . Colectomy with colostomy creation/hartmann procedure N/A 04/03/2015    Procedure: COLECTOMY WITH COLOSTOMY CREATION/HARTMANN PROCEDURE;  Surgeon: Sherri Rad, MD;  Location: [facility];  Service: General;  Laterality: N/A;  . Cardiac catheterization Right 04/03/2015    Procedure: CENTRAL LINE INSERTION;  Surgeon: Sherri Rad, MD;  Location: [facility];  Service: General;  Laterality: Right;  . Peripheral vascular catheterization N/A 05/25/2015    Procedure: IVC Filter Insertion;  Surgeon: Katha Cabal, MD;  Location: [facility];  Service: Cardiovascular;  Laterality: N/A;    SOCIAL HISTORY:   Social History  Substance Use Topics  . Smoking status: Never Smoker   . Smokeless tobacco: Never Used  . Alcohol Use: No    FAMILY HISTORY:   Family History  Problem Relation Age of Onset  . Heart attack Father   . Hypertension    . Arthritis-Osteo    . COPD Father   . Diabetes Brother   . Osteosarcoma Son     DRUG ALLERGIES:  No Known Allergies  MEDICATIONS AT HOME:   Prior to Admission medications  Medication Sig Start Date End Date Taking? Authorizing Provider  docusate sodium (COLACE) 100 MG capsule Take 100 mg by mouth 2 (two) times daily.   Yes Historical Provider, MD  ferrous sulfate 325 (65 FE) MG tablet Take 325 mg by mouth 2 (two) times daily with a meal.   Yes Historical Provider, MD  folic acid (FOLVITE) 301 MCG tablet Take 400 mcg by mouth daily.   Yes Historical Provider, MD  furosemide (LASIX) 20 MG tablet Take 20 mg by mouth daily.   Yes Historical Provider, MD  ondansetron (ZOFRAN) 4 MG tablet Take 4 mg by mouth every 6 (six) hours as  needed for nausea or vomiting.   Yes Historical Provider, MD  oxycodone-acetaminophen (PERCOCET) 2.5-325 MG per tablet Take 1 tablet by mouth every 4 (four) hours as needed for pain. 05/28/15  Yes Dustin Flock, MD  potassium chloride SA (K-DUR,KLOR-CON) 20 MEQ tablet Take 20 mEq by mouth daily.   Yes Historical Provider, MD  rivaroxaban (XARELTO) 20 MG TABS tablet Take 20 mg by mouth every evening.   Yes Historical Provider, MD  simethicone (MYLICON) 80 MG chewable tablet Chew 30 mg by mouth 4 (four) times daily -  before meals and at bedtime.   Yes Historical Provider, MD    REVIEW OF SYSTEMS:  Review of Systems  Constitutional: Negative for fever, chills, weight loss and malaise/fatigue.  HENT: Negative for ear pain, hearing loss and tinnitus.   Eyes: Negative for blurred vision, double vision, pain and redness.  Respiratory: Negative for cough, hemoptysis and shortness of breath.   Cardiovascular: Negative for chest pain, palpitations, orthopnea and leg swelling.  Gastrointestinal: Positive for nausea and abdominal pain. Negative for vomiting, diarrhea and constipation.  Genitourinary: Positive for dysuria and frequency. Negative for hematuria.  Musculoskeletal: Negative for back pain, joint pain and neck pain.  Skin:       No acne, rash, or lesions  Neurological: Negative for dizziness, tremors, focal weakness and weakness.  Endo/Heme/Allergies: Negative for polydipsia. Does not bruise/bleed easily.  Psychiatric/Behavioral: Negative for depression. The patient is not nervous/anxious and does not have insomnia.      VITAL SIGNS:   Filed Vitals:   07/15/15 2000 07/15/15 2030 07/15/15 2301  BP: 137/82 125/80   Pulse: 100 95   Temp: 97.7 F (36.5 C)    TempSrc: Oral    Resp: 21 22   Weight:   99.202 kg (218 lb 11.2 oz)  SpO2: 95% 94%    Wt Readings from Last 3 Encounters:  07/15/15 99.202 kg (218 lb 11.2 oz)  06/20/15 91.627 kg (202 lb)  05/29/15 99.02 kg (218 lb 4.8 oz)     PHYSICAL EXAMINATION:  Physical Exam  Vitals reviewed. Constitutional: She is oriented to person, place, and time. She appears well-developed and well-nourished. No distress.  HENT:  Head: Normocephalic and atraumatic.  Mouth/Throat: Oropharynx is clear and moist.  Eyes: Conjunctivae and EOM are normal. Pupils are equal, round, and reactive to light. No scleral icterus.  Neck: Normal range of motion. Neck supple. No JVD present. No thyromegaly present.  Cardiovascular: Normal rate, regular rhythm and intact distal pulses.  Exam reveals no gallop and no friction rub.   No murmur heard. Respiratory: Effort normal and breath sounds normal. No respiratory distress. She has no wheezes. She has no rales.  GI: Soft. Bowel sounds are normal. She exhibits no distension. There is tenderness (around her colostomy congruent with her erythema, and mild suprapubic tenderness).  Musculoskeletal: Normal range  of motion. She exhibits no edema.  No arthritis, no gout  Lymphadenopathy:    She has no cervical adenopathy.  Neurological: She is alert and oriented to person, place, and time. No cranial nerve deficit.  No dysarthria, no aphasia  Skin: Skin is warm and dry. No rash noted. There is erythema (around the site of her colostomy).  Psychiatric: She has a normal mood and affect. Her behavior is normal. Judgment and thought content normal.    LABORATORY PANEL:   CBC  Recent Labs Lab 07/15/15 2014  WBC 23.5*  HGB 13.4  HCT 43.8  PLT 889*   ------------------------------------------------------------------------------------------------------------------  Chemistries   Recent Labs Lab 07/15/15 2014  NA 139  K 4.1  CL 104  CO2 28  GLUCOSE 114*  BUN 9  CREATININE 0.73  CALCIUM 8.8*  AST 24  ALT 10*  ALKPHOS 112  BILITOT 0.4   ------------------------------------------------------------------------------------------------------------------  Cardiac Enzymes  Recent Labs Lab  07/15/15 2014  TROPONINI 0.04*   ------------------------------------------------------------------------------------------------------------------  RADIOLOGY:  Ct Abdomen Pelvis W Contrast  07/15/2015   CLINICAL DATA:  Abdominal pain around colostomy site. Colostomy was placed about 3 months ago due to diverticulitis. Nausea and increasing generalized weakness.  EXAM: CT ABDOMEN AND PELVIS WITH CONTRAST  TECHNIQUE: Multidetector CT imaging of the abdomen and pelvis was performed using the standard protocol following bolus administration of intravenous contrast.  CONTRAST:  123mL OMNIPAQUE IOHEXOL 300 MG/ML  SOLN  COMPARISON:  07/08/2014  FINDINGS: Focal infiltration or consolidation in the right lung base may indicate pneumonia. Calcification in the mitral valve region. Filling defects are demonstrated in the inferior vena cava above the level of a filter. This suggests thrombus/embolus.  Cholelithiasis with multiple stones in the gallbladder. The gallbladder is contracted. Mild diffuse fatty infiltration of the liver. Small accessory spleens. Pancreas, adrenal glands, abdominal aorta, and retroperitoneal lymph nodes are unremarkable. Cysts in the right kidney, largest measuring 6.8 cm maximal diameter. No change since prior study. No hydronephrosis in either kidney. Stomach, small bowel, and colon are not abnormally distended. There is scarring in the anterior abdominal wall consistent with surgical site. Left lower quadrant transverse colostomy with surgical anastomosis suggested at the splenic flexure of the colon. No peristomal hernia. Focal area of increased density in the left upper quadrant adjacent to the anastomosis probably representing fat necrosis or resolving postoperative hematoma. This is measuring about 1.5 cm diameter. No free air or free fluid in the abdomen.  Pelvis: Visualization of the low pelvis is limited due to streak artifact from bilateral hip arthroplasties. No free or loculated  pelvic fluid collections are demonstrated. Rectosigmoid colon is decompressed. Uterus is probably surgically absent. No pelvic mass or lymphadenopathy. Diverticula in the sigmoid colon. Degenerative changes in the spine. Mild anterior subluxation of L4 on L5. Appears to be severe bone encroachment upon the central canal at L3-4, L4-5, and L2-3 levels. No destructive bone lesions.  IMPRESSION: Filling defects in the inferior vena cava above the level of on IVC filter suggesting thrombus/ embolus.  Focal area of consolidation in the right lung base could indicate pneumonia or possibly infarct.  Left lower quadrant transverse colostomy. No evidence of proximal obstruction. Probable residual postoperative fluid collection versus fat necrosis in the left upper quadrant adjacent to an anastomosis.  Severe degenerative changes in the spine with probable spinal stenosis.  These results were called by telephone at the time of interpretation on 07/15/2015 at 10:39 pm to Dr. Conni Slipper , who verbally acknowledged these results.  Electronically Signed   By: Lucienne Capers M.D.   On: 07/15/2015 22:42    EKG:   Orders placed or performed during the hospital encounter of 07/15/15  . ED EKG  . ED EKG  . ED EKG  . ED EKG    IMPRESSION AND PLAN:  Principal Problem:   Abdominal wall cellulitis - patient has been in a rehabilitation facility making her risk for MRSA, so she was started on broad IV antibiotics in the ED including vancomycin. She did not meet SIRS criteria, but she did have an elevated white blood cell count. Blood cultures were sent, lactic acid was normal. Patient is hemodynamically stable. We'll continue antibiotics inpatient and follow cultures. Active Problems:   DVT (deep venous thrombosis) - around her IVC filter, the patient does have a history of lower extremity DVT, which was justification for placement of filter to begin with. Some question of possible PE versus pneumonia in her lung.  Radiologist recommended CTA chest 6-7 hours after her CT abdomen and pelvis in order to allow time for contrast from her first scan to clear and not interfere with the second scan. Patient was started on heparin drip at full dose anticoagulation.   UTI (lower urinary tract infection) - nitrite positive UA, urine culture sent, broad antibiotics as above will cover for UTI.   Polycythemia vera - continue home meds for this   Dependent edema - continue home dose Lasix for this   Arthritis - continue home when necessary analgesics  All the records are reviewed and case discussed with ED provider. Management plans discussed with the patient and/or family.  DVT PROPHYLAXIS: systemic anticoagulation  ADMISSION STATUS: Inpatient  CODE STATUS: Full  TOTAL TIME TAKING CARE OF THIS PATIENT: 45 minutes.    Leighanne Adolph [place] 07/16/2015, 12:16 AM  [facility]  Office  [phone]  CC: Primary care physician; Renata Caprice, DO

## 2015-07-16 NOTE — Consult Note (Signed)
Consult Note  Patient name: Yolanda Wells MRN: [id number] DOB: 1932-06-20 Sex: female  Consulting Physician:  Hospitalist service  Reason for Consult:  Chief Complaint  Patient presents with  . Abdominal Pain    HISTORY OF PRESENT ILLNESS: This is a 81 year old female who was admitted on 07/16/2015 with increasing abdominal pain and dysuria.  She has been having abdominal pain since her colostomy but it has gone worse, a she suffers from polycythemia.  She has a history of DVT.  She is a nonsmoker.  She is on medication for hypertension.  nd there has been some redness around her ostomy site.  She has not been having fevers or chills at home.  Her white blood cell Was elevated on arrival.  She had CT scan imaging in the emergency department which showed possible clot around her IVC filter and slightly above.  There is also concern for pneumonia versus lung infarction. The patient is status post colectomy with colostomy on 04/03/2015.  She had an IVC filter placed on 05/25/2015.  She has been maintained on anticoagulation.  Past Medical History  Diagnosis Date  . Hypertension   . Deep venous thrombosis     right lower extremity  . History of hysterectomy   . History of bilateral hip replacements   . Polycythemia vera   . Dependent edema   . Arthritis     Past Surgical History  Procedure Laterality Date  . Toe amputation      right  . Joint replacement      Left and Right Hip  . Abdominal hysterectomy    . Peripheral vascular catheterization N/A 04/02/2015    Procedure: Visceral Angiography;  Surgeon: Algernon Huxley, MD;  Location: [facility];  Service: Cardiovascular;  Laterality: N/A;  . Peripheral vascular catheterization N/A 04/02/2015    Procedure: Visceral Artery Intervention;  Surgeon: Algernon Huxley, MD;  Location: [facility];  Service: Cardiovascular;  Laterality: N/A;  . Colectomy with colostomy creation/hartmann procedure N/A 04/03/2015   Procedure: COLECTOMY WITH COLOSTOMY CREATION/HARTMANN PROCEDURE;  Surgeon: Sherri Rad, MD;  Location: [facility];  Service: General;  Laterality: N/A;  . Cardiac catheterization Right 04/03/2015    Procedure: CENTRAL LINE INSERTION;  Surgeon: Sherri Rad, MD;  Location: [facility];  Service: General;  Laterality: Right;  . Peripheral vascular catheterization N/A 05/25/2015    Procedure: IVC Filter Insertion;  Surgeon: Katha Cabal, MD;  Location: [facility];  Service: Cardiovascular;  Laterality: N/A;    Social History   Social History  . Marital Status: Widowed    Spouse Name: N/A  . Number of Children: N/A  . Years of Education: N/A   Occupational History  . Not on file.   Social History Main Topics  . Smoking status: Never Smoker   . Smokeless tobacco: Never Used  . Alcohol Use: No  . Drug Use: No  . Sexual Activity: Not on file   Other Topics Concern  . Not on file   Social History Narrative   Lives at [facility]    Family History  Problem Relation Age of Onset  . Heart attack Father   . Hypertension    . Arthritis-Osteo    . COPD Father   . Diabetes Brother   . Osteosarcoma Son     Allergies as of 07/15/2015  . (No Known Allergies)    No current facility-administered medications on file prior to encounter.  Current Outpatient Prescriptions on File Prior to Encounter  Medication Sig Dispense Refill  . ferrous sulfate 325 (65 FE) MG tablet Take 325 mg by mouth 2 (two) times daily with a meal.    . folic acid (FOLVITE) 774 MCG tablet Take 400 mcg by mouth daily.    . furosemide (LASIX) 20 MG tablet Take 20 mg by mouth daily.    . ondansetron (ZOFRAN) 4 MG tablet Take 4 mg by mouth every 6 (six) hours as needed for nausea or vomiting.    [name] oxycodone-acetaminophen (PERCOCET) 2.5-325 MG per tablet Take 1 tablet by mouth every 4 (four) hours as needed for pain. 30 tablet 0  . potassium chloride SA (K-DUR,KLOR-CON) 20 MEQ tablet Take 20 mEq by mouth daily.        REVIEW OF SYSTEMS: Constitutional: Negative for fever, chills, weight loss and malaise/fatigue.  HENT: Negative for ear pain, hearing loss and tinnitus.  Eyes: Negative for blurred vision, double vision, pain and redness.  Respiratory: Negative for cough, hemoptysis and shortness of breath.  Cardiovascular: Negative for chest pain, palpitations, orthopnea and leg swelling.  Gastrointestinal: Positive for nausea and abdominal pain. Negative for vomiting, diarrhea and constipation.  Genitourinary: Positive for dysuria and frequency. Negative for hematuria.  Musculoskeletal: Negative for back pain, joint pain and neck pain.  Skin:   No acne, rash, or lesions  Neurological: Negative for dizziness, tremors, focal weakness and weakness.  Endo/Heme/Allergies: Negative for polydipsia. Does not bruise/bleed easily.  Psychiatric/Behavioral: Negative for depression. The patient is not nervous/anxious and does not have insomnia.   PHYSICAL EXAMINATION: General: The patient appears their stated age.  Vital signs are BP 122/97 mmHg  Pulse 88  Temp(Src) 97.6 F (36.4 C) (Oral)  Resp 22  Ht 5\' 7"  (1.702 m)  Wt 213 lb 14.4 oz (97.024 kg)  BMI 33.49 kg/m2  SpO2 95% Pulmonary: Respirations are non-labored HEENT:  No gross abnormalities Abdomen: Soft and non-tender .  Colostomy site is functioning properly Musculoskeletal: There are no major deformities.   Neurologic: No focal weakness or paresthesias are detected, Skin: There are no ulcer or rashes noted. Psychiatric: The patient has normal affect. Cardiovascular: There is a regular rate and rhythm without significant murmur appreciated.  Diagnostic Studies: I have reviewed her CT scan of the abdomen which shows a filling defect just above the tip of the IVC filter, concerning for thrombus    Assessment:  Possible thrombus around the IVC filter Plan: I discussed with the patient and her family that there are several treatment  options for this.  The most conservative would be for continuation of anticoagulation.  It is unknown how long this filling defect has been present.  Also, based on her CT scan of the chest is uncertain whether she has had embolization to her lung creating the infarction or whether or not that is pneumonia.  Other options would include placing a suprarenal IVC filter.  Surgical risks are associated with this, and particular potential renal vein thrombosis.  She could potentially be a candidate for mechanical thrombectomy and thrombolysis to help evacuate the thrombus.  I will discuss this case with Dr. Delana Meyer tonight with definitive plans to be made in the morning.     Eldridge Abrahams, M.D. [facility] Office: [phone] Pager:  [phone]

## 2015-07-16 NOTE — Progress Notes (Signed)
ANTICOAGULATION CONSULT NOTE -Follow up [facility] for heparin Indication: DVT  No Known Allergies  Patient Measurements: Height: 5\' 7"  (170.2 cm) Weight: 213 lb 14.4 oz (97.024 kg) IBW/kg (Calculated) : 61.6 Heparin Dosing Weight: 83.5 kg  Vital Signs: Temp: 97.6 F (36.4 C) (09/05 1227) Temp Source: Oral (09/05 1227) BP: 122/97 mmHg (09/05 1227) Pulse Rate: 88 (09/05 1227)  Labs:  Recent Labs  07/15/15 2014 07/16/15 0815 07/16/15 1541 07/16/15 1655  HGB 13.4 12.7  --   --   HCT 43.8 40.8  --   --   PLT 889* 854*  --   --   HEPARINUNFRC  --  0.81*  --  0.60  CREATININE 0.73 0.57  --   --   TROPONINI 0.04*  --  <0.03  --     Estimated Creatinine Clearance: 66 mL/min (by C-G formula based on Cr of 0.57).   Medical History: Past Medical History  Diagnosis Date  . Hypertension   . Deep venous thrombosis     right lower extremity  . History of hysterectomy   . History of bilateral hip replacements   . Polycythemia vera   . Dependent edema   . Arthritis     Medications:  Infusions:     Assessment: 83 yof cc abdominal pain with radiologic evidence of blood clot in inferior vena cava above her IVC filter.   Goal of Therapy:  Heparin level 0.3-0.7 units/ml Monitor platelets by anticoagulation protocol: Yes   Plan:  Heparin level is above goal so will decrease infusion to 1250 units/hr and recheck a HL in 8 hours.   9/5: Heparin level at 1655=0.60. Will recheck level in 8 hrs for confirmation of therapeutic level.   Atharva Mirsky A, Pharm.D.  Clinical Pharmacist 07/16/2015,8:19 PM

## 2015-07-16 NOTE — Care Management (Signed)
Admitted to [facility] with the diagnosis of abdominal wall cellulitis. Lives at [facility] x 5 years. Granddaughter is [name] [phone]) Sees Dr, [facility]. Last seen in April-May per granddaughter. Discharged from this facility to [facility] following colon resection in May-June. Returned to this facility from [facility] and then discharged to [facility]. Uses walker to aid in ambulation. Colostomy in place Spoke with granddaughter. Understands that her grandmother can't live alone anymore. Would like to possibly pursue long term placement. Shelbie Ammons RN MSN Care Management [phone]

## 2015-07-16 NOTE — Progress Notes (Signed)
[facility] NAME: Yolanda Wells    MR#:  [id number]  DATE OF BIRTH:  Nov 06, 1932  SUBJECTIVE:  CHIEF COMPLAINT:   Chief Complaint  Patient presents with  . Abdominal Pain   the patient is 81 year old African-American female with past medical history significant for history of colostomy placed due to gastrointestinal bleed approximately 3 or 4 months ago per patient's recollection, who presents to the hospital with complaints of abdominal pain, dysuria. Patient's labs were remarkable for markedly elevated white blood cell count and mild elevation of troponin. She underwent CT scan of abdomen which revealed filling defect concerning for clot around IVC filter, . Unfortunately the patient is poor historian. No family members are available at present.   ROS  VITAL SIGNS: Blood pressure 129/68, pulse 87, temperature 97.8 F (36.6 C), temperature source Oral, resp. rate 22, height 5\' 7"  (1.702 m), weight 97.024 kg (213 lb 14.4 oz), SpO2 98 %.  PHYSICAL EXAMINATION:   GENERAL:  81 y.o.-year-old patient lying in the bed with no acute distress. Dysarthric EYES: Pupils equal, round, reactive to light and accommodation. No scleral icterus. Extraocular muscles intact.  HEENT: Head atraumatic, normocephalic. Oropharynx and nasopharynx clear.  NECK:  Supple, no jugular venous distention. No thyroid enlargement, no tenderness.  LUNGS: Normal breath sounds bilaterally, no wheezing, rales,rhonchi or crepitation. No use of accessory muscles of respiration.  CARDIOVASCULAR: S1, S2 normal. No murmurs, rubs, or gallops.  ABDOMEN: Soft, mild tenderness noted in the right lower quadrant but no rebound or guarding was noted. No significant tenderness around colostomy site or in left lower quadrant, nondistended. Stool is in colostomy bag . Bowel sounds present. No organomegaly or mass. I'm not seeing any significant skin abnormalities around the  colostomy site.  EXTREMITIES: No pedal edema, cyanosis, or clubbing.  NEUROLOGIC: Cranial nerves II through XII are intact. Muscle strength 5/5 in all extremities. Sensation intact. Gait not checked.  PSYCHIATRIC: The patient is alert and oriented x 3.  SKIN: No obvious rash, lesion, or ulcer.   ORDERS/RESULTS REVIEWED:   CBC  Recent Labs Lab 07/15/15 2014 07/16/15 0815  WBC 23.5* 22.7*  HGB 13.4 12.7  HCT 43.8 40.8  PLT 889* 854*  MCV 79.5* 78.8*  MCH 24.3* 24.5*  MCHC 30.6* 31.1*  RDW 26.9* 26.6*  LYMPHSABS 1.9  --   MONOABS 0.7  --   EOSABS 0.3  --   BASOSABS 0.1  --    ------------------------------------------------------------------------------------------------------------------  Chemistries   Recent Labs Lab 07/15/15 2014 07/16/15 0815  NA 139 134*  K 4.1 3.9  CL 104 103  CO2 28 26  GLUCOSE 114* 95  BUN 9 7  CREATININE 0.73 0.57  CALCIUM 8.8* 8.3*  AST 24  --   ALT 10*  --   ALKPHOS 112  --   BILITOT 0.4  --    ------------------------------------------------------------------------------------------------------------------ estimated creatinine clearance is 66 mL/min (by C-G formula based on Cr of 0.57). ------------------------------------------------------------------------------------------------------------------ No results for input(s): TSH, T4TOTAL, T3FREE, THYROIDAB in the last 72 hours.  Invalid input(s): FREET3  Cardiac Enzymes  Recent Labs Lab 07/15/15 2014  TROPONINI 0.04*   ------------------------------------------------------------------------------------------------------------------ Invalid input(s): POCBNP ---------------------------------------------------------------------------------------------------------------  RADIOLOGY: Dg Chest 2 View  07/16/2015   CLINICAL DATA:  Abdominal pain around the colostomy site. Colostomy placed 3 months ago. Nausea and weakness.  EXAM: CHEST  2 VIEW  COMPARISON:  CT chest 05/24/2015.  Chest  04/19/2015  FINDINGS: Shallow inspiration with elevation of the  right hemidiaphragm. Mild cardiac enlargement and mild pulmonary vascular congestion. No evidence of edema. Linear infiltration or consolidation in the right lung base appear similar to prior study. Prominence of hilar structures is likely vascular. No blunting of costophrenic angles. No pneumothorax. Mediastinal contours appear intact. Tortuous aorta. Degenerative changes in the spine and shoulders with mild thoracic scoliosis convex towards the right.  IMPRESSION: Cardiac enlargement with mild vascular congestion. Linear infiltration or consolidation again demonstrated in the right lung base, similar to prior study. Prominent central hilar vascularity.   Electronically Signed   By: Lucienne Capers M.D.   On: 07/16/2015 00:42   Ct Angio Chest Pe W/cm &/or Wo Cm  07/16/2015   CLINICAL DATA:  Recent CT demonstrating thrombosis at IVC filter site. Follow-up pneumonia versus infarct of lung. History of pulmonary embolism.  EXAM: CT ANGIOGRAPHY CHEST WITH CONTRAST  TECHNIQUE: Multidetector CT imaging of the chest was performed using the standard protocol during bolus administration of intravenous contrast. Multiplanar CT image reconstructions and MIPs were obtained to evaluate the vascular anatomy.  CONTRAST:  49mL OMNIPAQUE IOHEXOL 350 MG/ML SOLN  COMPARISON:  CT abdomen and pelvis July 15, 2015 and CT chest May 24, 2015  FINDINGS: Adequate main pulmonary arterial contrast opacification. Main pulmonary artery is enlarged at 3.9 cm, unchanged. Recannulized RIGHT lower lobe pulmonary artery though, there residual subsegmental pulmonary emboli on the RIGHT lower lobe. No acute pulmonary embolism.  The heart is mildly enlarged, persistent RIGHT heart strain. Mitral annular calcifications. Thoracic aorta is normal in course and caliber with moderate calcific atherosclerosis. No lymphadenopathy by CT size criteria.  Wedge-like consolidation RIGHT lower  lobe with mild enhancement. Associated opacification of the RIGHT lower lobe distal bronchi. Mild diffuse bronchial wall thickening. No pneumothorax. Similar lingular and RIGHT middle lobe atelectasis/ scarring.  Please see CT of abdomen and pelvis from yesterday for dedicated findings. Re- demonstration 2.5 cm partially calcified thyroid nodule for which follow up thyroid sonogram is recommended on a nonemergent basis. Severe degenerative change of the included shoulders. Broad thoracic dextroscoliosis.  Review of the MIP images confirms the above findings.  IMPRESSION: No acute pulmonary embolism. Similarly enlarged main pulmonary artery compatible with chronic pulmonary arterial hypertension.  RIGHT lower lobe subsegmental chronic pulmonary embolism with recannulized RIGHT lower lobe pulmonary artery. RIGHT lower lobe atelectasis, less likely pneumonia or infarct. Persistent RIGHT heart strain.  Bronchial wall thickening can be seen with reactive airway disease or bronchitis, with partial opacification of RIGHT lower lobe distal bronchi.   Electronically Signed   By: Elon Alas M.D.   On: 07/16/2015 05:44   Ct Abdomen Pelvis W Contrast  07/15/2015   CLINICAL DATA:  Abdominal pain around colostomy site. Colostomy was placed about 3 months ago due to diverticulitis. Nausea and increasing generalized weakness.  EXAM: CT ABDOMEN AND PELVIS WITH CONTRAST  TECHNIQUE: Multidetector CT imaging of the abdomen and pelvis was performed using the standard protocol following bolus administration of intravenous contrast.  CONTRAST:  156mL OMNIPAQUE IOHEXOL 300 MG/ML  SOLN  COMPARISON:  07/08/2014  FINDINGS: Focal infiltration or consolidation in the right lung base may indicate pneumonia. Calcification in the mitral valve region. Filling defects are demonstrated in the inferior vena cava above the level of a filter. This suggests thrombus/embolus.  Cholelithiasis with multiple stones in the gallbladder. The  gallbladder is contracted. Mild diffuse fatty infiltration of the liver. Small accessory spleens. Pancreas, adrenal glands, abdominal aorta, and retroperitoneal lymph nodes are unremarkable. Cysts in the right kidney,  largest measuring 6.8 cm maximal diameter. No change since prior study. No hydronephrosis in either kidney. Stomach, small bowel, and colon are not abnormally distended. There is scarring in the anterior abdominal wall consistent with surgical site. Left lower quadrant transverse colostomy with surgical anastomosis suggested at the splenic flexure of the colon. No peristomal hernia. Focal area of increased density in the left upper quadrant adjacent to the anastomosis probably representing fat necrosis or resolving postoperative hematoma. This is measuring about 1.5 cm diameter. No free air or free fluid in the abdomen.  Pelvis: Visualization of the low pelvis is limited due to streak artifact from bilateral hip arthroplasties. No free or loculated pelvic fluid collections are demonstrated. Rectosigmoid colon is decompressed. Uterus is probably surgically absent. No pelvic mass or lymphadenopathy. Diverticula in the sigmoid colon. Degenerative changes in the spine. Mild anterior subluxation of L4 on L5. Appears to be severe bone encroachment upon the central canal at L3-4, L4-5, and L2-3 levels. No destructive bone lesions.  IMPRESSION: Filling defects in the inferior vena cava above the level of on IVC filter suggesting thrombus/ embolus.  Focal area of consolidation in the right lung base could indicate pneumonia or possibly infarct.  Left lower quadrant transverse colostomy. No evidence of proximal obstruction. Probable residual postoperative fluid collection versus fat necrosis in the left upper quadrant adjacent to an anastomosis.  Severe degenerative changes in the spine with probable spinal stenosis.  These results were called by telephone at the time of interpretation on 07/15/2015 at 10:39 pm to  Dr. Conni Slipper , who verbally acknowledged these results.   Electronically Signed   By: Lucienne Capers M.D.   On: 07/15/2015 22:42    EKG:  Orders placed or performed during the hospital encounter of 07/15/15  . ED EKG  . ED EKG  . ED EKG  . ED EKG    ASSESSMENT AND PLAN:  Principal Problem:   Abdominal wall cellulitis Active Problems:   DVT (deep venous thrombosis)   Polycythemia vera   Dependent edema   Arthritis   UTI (lower urinary tract infection) 1. RLQ Abdominal pain due to unclear etiology at this time, possibly acute cystitis related, I didn't see any skin abnormalities consistent with cellulitis, rule out clot and discomfort due to clot around IVC filter, although no significant lower extremity swelling was noted. We will be getting vascular surgeon as well as general surgeon to see patient in consultation, we will continue antibiotic therapy, broad-spectrum 2. Suspected IVC filter area/inferior vena cava clot, continue heparin IV, vascular consultation will be obtained 3. Hyponatremia. Initiate patient on IVs fluids, follow in the morning 4. Leukocytosis. Follow with therapy 5. Thrombocytosis possibly reactive, following morning 6. Acute cystitis without hematuria. Continue antibiotic therapy. Following cultures. 7. Questionable pneumonia. Follow blood cultures. Get sputum cultures if possible   Management plans discussed with the patient.   DRUG ALLERGIES: No Known Allergies  CODE STATUS:     Code Status Orders        Start     Ordered   07/16/15 0152  Full code   Continuous     07/16/15 0151      TOTAL TIME TAKING CARE OF THIS PATIENT: 45 minutes.    Theodoro Grist M.D on 07/16/2015 at 8:58 AM  Between 7am to 6pm - Pager - [phone]  After 6pm go to [email or web address] - password [email or web address] [facility]  Office  [phone]  CC: Primary care physician; Renata Caprice, DO

## 2015-07-16 NOTE — Care Management Important Message (Signed)
Important Message  Patient Details  Name: Yolanda Wells MRN: [id number] Date of Birth: [birth year]   Medicare Important Message Given:  Yes-second notification given    Shelbie Ammons, RN 07/16/2015, 10:19 AM

## 2015-07-16 NOTE — Plan of Care (Signed)
Problem: Discharge Progression Outcomes Goal: Discharge plan in place and appropriate Pt admitted with cellulitis of abdomen around colostomy-place in May 2016 Heparin drip with blood clot with IVC filter Pt lives at [facility] Goal: Other Discharge Outcomes/Goals Plan of care progress to goal: New admission-waiting for CT scan to evaluate blood clot-continues on heparin drip

## 2015-07-16 NOTE — Progress Notes (Signed)
ANTIBIOTIC CONSULT NOTE - INITIAL  Pharmacy Consult for vancomycin Indication: cellulitis  No Known Allergies  Patient Measurements: Height: 5\' 7"  (170.2 cm) Weight: 213 lb 14.4 oz (97.024 kg) IBW/kg (Calculated) : 61.6 Adjusted Body Weight: 76.7 kg  Vital Signs: Temp: 98 F (36.7 C) (09/05 0157) Temp Source: Oral (09/05 0157) BP: 125/71 mmHg (09/05 0157) Pulse Rate: 90 (09/05 0157) Intake/Output from previous day:   Intake/Output from this shift:    Labs:  Recent Labs  07/15/15 2014  WBC 23.5*  HGB 13.4  PLT 889*  CREATININE 0.73   Estimated Creatinine Clearance: 66 mL/min (by C-G formula based on Cr of 0.73). No results for input(s): VANCOTROUGH, VANCOPEAK, VANCORANDOM, GENTTROUGH, GENTPEAK, GENTRANDOM, TOBRATROUGH, TOBRAPEAK, TOBRARND, AMIKACINPEAK, AMIKACINTROU, AMIKACIN in the last 72 hours.   Microbiology: No results found for this or any previous visit (from the past 720 hour(s)).  Medical History: Past Medical History  Diagnosis Date  . Hypertension   . Deep venous thrombosis     right lower extremity  . History of hysterectomy   . History of bilateral hip replacements   . Polycythemia vera   . Dependent edema   . Arthritis     Medications:  Infusions:   Assessment: 81 cc abdominal pain around ostomy site having some dysuria for several days before admission. CT concerning for pneumonia vs infarcted lung, also has clot in inferior vena cava above IVC filter.   Vd 53.6 L, Ke 0.059 hr-1, T1/2 11.6 hr, predicted trough 18.3 mcg/mL  Goal of Therapy:  Vancomycin trough level 15-20 mcg/ml  Plan:  Expected duration 7 days with resolution of temperature and/or normalization of WBC. Zosyn 3.375 gm IV Q8H EI and vancomycin 1 gm IV Q12H with stacked dosing second dose 6 hours after first. Trough ordered before fourth dose and pharmacy will adjust as needed to maintain trough 15 to 20 mcg/mL.  Laural Benes, Pharm.D Clinical Pharmacist 07/16/2015,2:17  AM

## 2015-07-16 NOTE — Clinical Social Work Note (Addendum)
CSW contacted [employer] to ask about possible medicaid for pt.  Being that pt already has a supplemental insurance CSW does not believe pt will be eligible for medicaid coverage at this time.  She may need to spend down in order to qualify.    SNF placement at this time will likely fall under private pay as well as pt has used her 100 days already.

## 2015-07-17 LAB — VANCOMYCIN, TROUGH: VANCOMYCIN TR: 18 ug/mL (ref 10–20)

## 2015-07-17 LAB — TROPONIN I
Troponin I: <0.03 ng/mL (ref <0.031)
Troponin I: 0.04 ng/mL — ABNORMAL HIGH (ref <0.031)

## 2015-07-17 LAB — CBC
HCT: 40.8 % (ref 35.0–47.0)
HEMOGLOBIN: 12.8 g/dL (ref 12.0–16.0)
MCH: 25.4 pg — ABNORMAL LOW (ref 26.0–34.0)
MCHC: 31.3 g/dL — ABNORMAL LOW (ref 32.0–36.0)
MCV: 81 fL (ref 80.0–100.0)
PLATELETS: 762 10*3/uL — AB (ref 150–440)
RBC: 5.04 MIL/uL (ref 3.80–5.20)
RDW: 25.9 % — ABNORMAL HIGH (ref 11.5–14.5)
WBC: 22.6 10*3/uL — AB (ref 3.6–11.0)

## 2015-07-17 LAB — HEPARIN LEVEL (UNFRACTIONATED)
HEPARIN UNFRACTIONATED: 0.42 [IU]/mL (ref 0.30–0.70)
HEPARIN UNFRACTIONATED: 0.18 [IU]/mL — AB (ref 0.30–0.70)

## 2015-07-17 LAB — SODIUM: SODIUM: 129 mmol/L — AB (ref 135–145)

## 2015-07-17 MED ORDER — LEVOFLOXACIN 500 MG PO TABS
500.0000 mg | ORAL_TABLET | Freq: Every day | ORAL | Status: DC
  Administered 2015-07-17 – 2015-07-19 (×3): 500 mg via ORAL
  Filled 2015-07-17 (×3): qty 1

## 2015-07-17 MED ORDER — HEPARIN (PORCINE) IN NACL 100-0.45 UNIT/ML-% IJ SOLN
1250.0000 [IU]/h | INTRAMUSCULAR | Status: DC
  Administered 2015-07-17: 10:00:00 1250 [IU]/h via INTRAVENOUS
  Filled 2015-07-17 (×2): qty 250

## 2015-07-17 MED ORDER — HEPARIN BOLUS VIA INFUSION
2500.0000 [IU] | Freq: Once | INTRAVENOUS | Status: AC
  Administered 2015-07-17: 2500 [IU] via INTRAVENOUS

## 2015-07-17 MED ORDER — HEPARIN (PORCINE) IN NACL 100-0.45 UNIT/ML-% IJ SOLN
1400.0000 [IU]/h | INTRAMUSCULAR | Status: DC
  Administered 2015-07-17: 1500 [IU]/h via INTRAVENOUS
  Administered 2015-07-18: 1400 [IU]/h via INTRAVENOUS
  Administered 2015-07-18: 1500 [IU]/h via INTRAVENOUS
  Administered 2015-07-19: 16:00:00 1400 [IU]/h via INTRAVENOUS
  Filled 2015-07-17 (×8): qty 250

## 2015-07-17 NOTE — Progress Notes (Signed)
[facility]  Daily Progress Note   Subjective  -patient denies new shortness of breath no chest pain.  Objective Filed Vitals:   07/16/15 2119 07/17/15 0459 07/17/15 1528 07/17/15 2116  BP: 118/64 127/63 118/70 143/81  Pulse: 89 86 89 98  Temp: 98.2 F (36.8 C) 98.5 F (36.9 C) 99.4 F (37.4 C) 98.4 F (36.9 C)  TempSrc: Oral Oral Oral Oral  Resp: 18 18 20 18   Height:      Weight:      SpO2: 94% 97% 97% 92%    Intake/Output Summary (Last 24 hours) at 07/17/15 2134 Last data filed at 07/17/15 1920  Gross per 24 hour  Intake 397.88 ml  Output    500 ml  Net -102.12 ml    PULM  Normal effort , no use of accessory muscles CV  No JVD, RRR Abd      No distended, nontender VASC  Lower extremities pink and warm 2+ edema  Laboratory CBC    Component Value Date/Time   WBC 22.6* 07/17/2015 0106   WBC 6.4 04/10/2015   WBC 3.7 07/08/2014 0936   HGB 12.8 07/17/2015 0106   HGB 13.8 07/08/2014 0936   HCT 40.8 07/17/2015 0106   HCT 42.0 07/08/2014 0936   PLT 762* 07/17/2015 0106   PLT 834* 07/08/2014 0936    BMET    Component Value Date/Time   NA 129* 07/17/2015 0514   NA 137 04/10/2015   NA 144 07/08/2014 0936   K 3.9 07/16/2015 0815   K 3.7 07/08/2014 0936   CL 103 07/16/2015 0815   CL 108* 07/08/2014 0936   CO2 26 07/16/2015 0815   CO2 27 07/08/2014 0936   GLUCOSE 95 07/16/2015 0815   GLUCOSE 90 07/08/2014 0936   BUN 7 07/16/2015 0815   BUN 6 04/10/2015   BUN 15 07/08/2014 0936   CREATININE 0.57 07/16/2015 0815   CREATININE 0.4* 04/10/2015   CREATININE 0.64 07/08/2014 0936   CALCIUM 8.3* 07/16/2015 0815   CALCIUM 8.4* 07/08/2014 0936   GFRNONAA >60 07/16/2015 0815   GFRNONAA >60 07/08/2014 0936   GFRAA >60 07/16/2015 0815   GFRAA >60 07/08/2014 0936    Assessment/Planning:   Thrombus above the IVC filter        I have personally reviewed the CT scan and then reviewed it with the radiologist.  She has two discrete filling  defects (which are likely connected) the upper of which is larger than the one that appears attached to the filter.  More importantly the second thrombus extends to the level of the atrium and is in the location that a suprarenal filter would be placed.  This negates the possibility of placing a second filter.  At this point I would continue the heparin gtt and begin Coumadin.  I feel that she has now failed Xarelto for a second time and the ability to monitor an INR will be helpful to ensure she is adequately anticoagulated.     Katha Cabal  07/17/2015, 9:34 PM

## 2015-07-17 NOTE — Progress Notes (Signed)
ANTIBIOTIC CONSULT NOTE - INITIAL  Pharmacy Consult for vancomycin Indication: cellulitis  No Known Allergies  Patient Measurements: Height: 5\' 7"  (170.2 cm) Weight: 213 lb 14.4 oz (97.024 kg) IBW/kg (Calculated) : 61.6 Adjusted Body Weight: 76.7 kg  Vital Signs: Temp: 98.5 F (36.9 C) (09/06 0459) Temp Source: Oral (09/06 0459) BP: 127/63 mmHg (09/06 0459) Pulse Rate: 86 (09/06 0459) Intake/Output from previous day: 09/05 0701 - 09/06 0700 In: 760 [P.O.:760] Out: -  Intake/Output from this shift:    Labs:  Recent Labs  07/15/15 2014 07/16/15 0815 07/17/15 0106  WBC 23.5* 22.7* 22.6*  HGB 13.4 12.7 12.8  PLT 889* 854* 762*  CREATININE 0.73 0.57  --    Estimated Creatinine Clearance: 66 mL/min (by C-G formula based on Cr of 0.57).  Recent Labs  07/17/15 0514  [facility] 18     Microbiology: Recent Results (from the past 720 hour(s))  Culture, blood (routine x 2)     Status: None (Preliminary result)   Collection Time: 07/15/15  8:50 PM  Result Value Ref Range Status   Specimen Description BLOOD LEFT ASSIST CONTROL  Final   Special Requests BOTTLES DRAWN AEROBIC AND ANAEROBIC 3CC  Final   Culture NO GROWTH < 12 HOURS  Final   Report Status PENDING  Incomplete  Culture, blood (routine x 2)     Status: None (Preliminary result)   Collection Time: 07/15/15 10:50 PM  Result Value Ref Range Status   Specimen Description BLOOD LEFT HAND  Final   Special Requests BOTTLES DRAWN AEROBIC AND ANAEROBIC 4CC  Final   Culture NO GROWTH < 12 HOURS  Final   Report Status PENDING  Incomplete    Medical History: Past Medical History  Diagnosis Date  . Hypertension   . Deep venous thrombosis     right lower extremity  . History of hysterectomy   . History of bilateral hip replacements   . Polycythemia vera   . Dependent edema   . Arthritis     Medications:  Infusions:   Assessment: 81 cc abdominal pain around ostomy site having some dysuria for several days  before admission. CT concerning for pneumonia vs infarcted lung, also has clot in inferior vena cava above IVC filter.   Vd 53.6 L, Ke 0.059 hr-1, T1/2 11.6 hr, predicted trough 18.3 mcg/mL  Goal of Therapy:  Vancomycin trough level 15-20 mcg/ml  Plan:  Expected duration 7 days with resolution of temperature and/or normalization of WBC. Zosyn 3.375 gm IV Q8H EI and vancomycin 1 gm IV Q12H with stacked dosing second dose 6 hours after first. Trough ordered before fourth dose and pharmacy will adjust as needed to maintain trough 15 to 20 mcg/mL.  9/6 AM trough 18. Continue current regimen.   Kennley Schwandt S, Pharm.D Clinical Pharmacist 07/17/2015,7:02 AM

## 2015-07-17 NOTE — Progress Notes (Signed)
   Surgery.  Call any questions or concerns we will sign off the case.

## 2015-07-17 NOTE — Progress Notes (Addendum)
ANTICOAGULATION CONSULT NOTE -Follow up [facility] for heparin Indication: DVT  No Known Allergies  Patient Measurements: Height: 5\' 7"  (170.2 cm) Weight: 213 lb 14.4 oz (97.024 kg) IBW/kg (Calculated) : 61.6 Heparin Dosing Weight: 83.5 kg  Vital Signs: Temp: 99.4 F (37.4 C) (09/06 1528) Temp Source: Oral (09/06 1528) BP: 118/70 mmHg (09/06 1528) Pulse Rate: 89 (09/06 1528)  Labs:  Recent Labs  07/15/15 2014  07/16/15 0815 07/16/15 1541 07/16/15 1655 07/16/15 2318 07/17/15 0106 07/17/15 0514 07/17/15 1652  HGB 13.4  --  12.7  --   --   --  12.8  --   --   HCT 43.8  --  40.8  --   --   --  40.8  --   --   PLT 889*  --  854*  --   --   --  762*  --   --   HEPARINUNFRC  --   < > 0.81*  --  0.60  --  0.42  --  0.18*  CREATININE 0.73  --  0.57  --   --   --   --   --   --   TROPONINI 0.04*  --   --  <0.03  --  <0.03  --  0.04*  --   < > = values in this interval not displayed.  Estimated Creatinine Clearance: 66 mL/min (by C-G formula based on Cr of 0.57).   Medical History: Past Medical History  Diagnosis Date  . Hypertension   . Deep venous thrombosis     right lower extremity  . History of hysterectomy   . History of bilateral hip replacements   . Polycythemia vera   . Dependent edema   . Arthritis     Medications:  Infusions:  . heparin      Assessment: 83 yof cc abdominal pain with radiologic evidence of blood clot in inferior vena cava above her IVC filter.   Goal of Therapy:  Heparin level 0.3-0.7 units/ml Monitor platelets by anticoagulation protocol: Yes   Plan:  Heparin level is above goal so will decrease infusion to 1250 units/hr and recheck a HL in 8 hours.   9/5: Heparin level at 1655=0.60. Will recheck level in 8 hrs for confirmation of therapeutic level.   9/6 0100 heparin level 0.42. Recheck with CBC in AM.  9/6: HL @ 17:00 = 0.18 Will order Heparin 2500 units IV X 1 bolus and increase drip rate to 1500  units/hr. Will draw next HL 8 hrs after rate change on 9/7 @ 3:00.  Abeera Flannery D, Pharm.D.  Clinical Pharmacist 07/17/2015,6:59 PM

## 2015-07-17 NOTE — Plan of Care (Signed)
Problem: Discharge Progression Outcomes Goal: Other Discharge Outcomes/Goals Outcome: Progressing Plan of care progress to goal:  No complaints of pain. Diet advanced to regular - tolerating well. Heparin drip infusing as ordered. Family at bedside.

## 2015-07-17 NOTE — Progress Notes (Signed)
ANTICOAGULATION CONSULT NOTE -Follow up [place] for heparin Indication: DVT  No Known Allergies  Patient Measurements: Height: 5\' 7"  (170.2 cm) Weight: 213 lb 14.4 oz (97.024 kg) IBW/kg (Calculated) : 61.6 Heparin Dosing Weight: 83.5 kg  Vital Signs: Temp: 98.2 F (36.8 C) (09/05 2119) Temp Source: Oral (09/05 2119) BP: 118/64 mmHg (09/05 2119) Pulse Rate: 89 (09/05 2119)  Labs:  Recent Labs  07/15/15 2014 07/16/15 0815 07/16/15 1541 07/16/15 1655 07/16/15 2318 07/17/15 0106  HGB 13.4 12.7  --   --   --  12.8  HCT 43.8 40.8  --   --   --  40.8  PLT 889* 854*  --   --   --  762*  HEPARINUNFRC  --  0.81*  --  0.60  --  0.42  CREATININE 0.73 0.57  --   --   --   --   TROPONINI 0.04*  --  <0.03  --  <0.03  --     Estimated Creatinine Clearance: 66 mL/min (by C-G formula based on Cr of 0.57).   Medical History: Past Medical History  Diagnosis Date  . Hypertension   . Deep venous thrombosis     right lower extremity  . History of hysterectomy   . History of bilateral hip replacements   . Polycythemia vera   . Dependent edema   . Arthritis     Medications:  Infusions:     Assessment: 83 yof cc abdominal pain with radiologic evidence of blood clot in inferior vena cava above her IVC filter.   Goal of Therapy:  Heparin level 0.3-0.7 units/ml Monitor platelets by anticoagulation protocol: Yes   Plan:  Heparin level is above goal so will decrease infusion to 1250 units/hr and recheck a HL in 8 hours.   9/5: Heparin level at 1655=0.60. Will recheck level in 8 hrs for confirmation of therapeutic level.   9/6 0100 heparin level 0.42. Recheck with CBC in AM.  Yordan Martindale S, Pharm.D.  Clinical Pharmacist 07/17/2015,1:54 AM

## 2015-07-17 NOTE — Progress Notes (Signed)
[place] [facility] [place] NAME: Yolanda Wells    MR#:  [id number]  DATE OF BIRTH:  30-May-1932  SUBJECTIVE:  CHIEF COMPLAINT:   Chief Complaint  Patient presents with  . Abdominal Pain   the patient is 81 year old African-American female with past medical history significant for history of colostomy placed due to gastrointestinal bleed approximately 3 or 4 months ago per patient's recollection, who presents to the hospital with complaints of abdominal pain, dysuria. Patient's labs were remarkable for markedly elevated white blood cell count and mild elevation of troponin. She underwent CT scan of abdomen which revealed filling defect concerning for clot around IVC filter, . Patient complains of some mild discomfort in lower abdomen, however, no significant pains and feels overall better today , being continued on heparin . Was seen by vascular surgery who recommended possibly 1 more IVC filter placement above the clot as patient was felt to be unlikely candidate for thrombectomy , although final decision about vascular procedure is to follow.  ROS  VITAL SIGNS: Blood pressure 127/63, pulse 86, temperature 98.5 F (36.9 C), temperature source Oral, resp. rate 18, height 5\' 7"  (1.702 m), weight 97.024 kg (213 lb 14.4 oz), SpO2 97 %.  PHYSICAL EXAMINATION:   GENERAL:  81 y.o.-year-old patient lying in the bed with no acute distress. Dysarthric EYES: Pupils equal, round, reactive to light and accommodation. No scleral icterus. Extraocular muscles intact.  HEENT: Head atraumatic, normocephalic. Oropharynx and nasopharynx clear.  NECK:  Supple, no jugular venous distention. No thyroid enlargement, no tenderness.  LUNGS: Normal breath sounds bilaterally, no wheezing, rales,rhonchi or crepitation. No use of accessory muscles of respiration.  CARDIOVASCULAR: S1, S2 normal. No murmurs, rubs, or gallops.  ABDOMEN: Soft, mild tenderness noted in the right  lower quadrant but no rebound or guarding was noted. No significant tenderness around colostomy site or in left lower quadrant, nondistended. Stool is in colostomy bag . Bowel sounds present. No organomegaly or mass. I'm not seeing any significant skin abnormalities around the colostomy site.  EXTREMITIES: No pedal edema, cyanosis, or clubbing.  NEUROLOGIC: Cranial nerves II through XII are intact. Muscle strength 5/5 in all extremities. Sensation intact. Gait not checked.  PSYCHIATRIC: The patient is alert and oriented x 3.  SKIN: No obvious rash, lesion, or ulcer.   ORDERS/RESULTS REVIEWED:   CBC  Recent Labs Lab 07/15/15 2014 07/16/15 0815 07/17/15 0106  WBC 23.5* 22.7* 22.6*  HGB 13.4 12.7 12.8  HCT 43.8 40.8 40.8  PLT 889* 854* 762*  MCV 79.5* 78.8* 81.0  MCH 24.3* 24.5* 25.4*  MCHC 30.6* 31.1* 31.3*  RDW 26.9* 26.6* 25.9*  LYMPHSABS 1.9  --   --   MONOABS 0.7  --   --   EOSABS 0.3  --   --   BASOSABS 0.1  --   --    ------------------------------------------------------------------------------------------------------------------  Chemistries   Recent Labs Lab 07/15/15 2014 07/16/15 0815  NA 139 134*  K 4.1 3.9  CL 104 103  CO2 28 26  GLUCOSE 114* 95  BUN 9 7  CREATININE 0.73 0.57  CALCIUM 8.8* 8.3*  AST 24  --   ALT 10*  --   ALKPHOS 112  --   BILITOT 0.4  --    ------------------------------------------------------------------------------------------------------------------ estimated creatinine clearance is 66 mL/min (by C-G formula based on Cr of 0.57). ------------------------------------------------------------------------------------------------------------------ No results for input(s): TSH, T4TOTAL, T3FREE, THYROIDAB in the last 72 hours.  Invalid input(s): FREET3  Cardiac  Enzymes  Recent Labs Lab 07/16/15 1541 07/16/15 2318 07/17/15 0514  TROPONINI <0.03 <0.03 0.04*    ------------------------------------------------------------------------------------------------------------------ Invalid input(s): POCBNP ---------------------------------------------------------------------------------------------------------------  RADIOLOGY: Dg Chest 2 View  07/16/2015   CLINICAL DATA:  Abdominal pain around the colostomy site. Colostomy placed 3 months ago. Nausea and weakness.  EXAM: CHEST  2 VIEW  COMPARISON:  CT chest 05/24/2015.  Chest 04/19/2015  FINDINGS: Shallow inspiration with elevation of the right hemidiaphragm. Mild cardiac enlargement and mild pulmonary vascular congestion. No evidence of edema. Linear infiltration or consolidation in the right lung base appear similar to prior study. Prominence of hilar structures is likely vascular. No blunting of costophrenic angles. No pneumothorax. Mediastinal contours appear intact. Tortuous aorta. Degenerative changes in the spine and shoulders with mild thoracic scoliosis convex towards the right.  IMPRESSION: Cardiac enlargement with mild vascular congestion. Linear infiltration or consolidation again demonstrated in the right lung base, similar to prior study. Prominent central hilar vascularity.   Electronically Signed   By: Lucienne Capers M.D.   On: 07/16/2015 00:42   Ct Angio Chest Pe W/cm &/or Wo Cm  07/16/2015   CLINICAL DATA:  Recent CT demonstrating thrombosis at IVC filter site. Follow-up pneumonia versus infarct of lung. History of pulmonary embolism.  EXAM: CT ANGIOGRAPHY CHEST WITH CONTRAST  TECHNIQUE: Multidetector CT imaging of the chest was performed using the standard protocol during bolus administration of intravenous contrast. Multiplanar CT image reconstructions and MIPs were obtained to evaluate the vascular anatomy.  CONTRAST:  55mL OMNIPAQUE IOHEXOL 350 MG/ML SOLN  COMPARISON:  CT abdomen and pelvis July 15, 2015 and CT chest May 24, 2015  FINDINGS: Adequate main pulmonary arterial contrast  opacification. Main pulmonary artery is enlarged at 3.9 cm, unchanged. Recannulized RIGHT lower lobe pulmonary artery though, there residual subsegmental pulmonary emboli on the RIGHT lower lobe. No acute pulmonary embolism.  The heart is mildly enlarged, persistent RIGHT heart strain. Mitral annular calcifications. Thoracic aorta is normal in course and caliber with moderate calcific atherosclerosis. No lymphadenopathy by CT size criteria.  Wedge-like consolidation RIGHT lower lobe with mild enhancement. Associated opacification of the RIGHT lower lobe distal bronchi. Mild diffuse bronchial wall thickening. No pneumothorax. Similar lingular and RIGHT middle lobe atelectasis/ scarring.  Please see CT of abdomen and pelvis from yesterday for dedicated findings. Re- demonstration 2.5 cm partially calcified thyroid nodule for which follow up thyroid sonogram is recommended on a nonemergent basis. Severe degenerative change of the included shoulders. Broad thoracic dextroscoliosis.  Review of the MIP images confirms the above findings.  IMPRESSION: No acute pulmonary embolism. Similarly enlarged main pulmonary artery compatible with chronic pulmonary arterial hypertension.  RIGHT lower lobe subsegmental chronic pulmonary embolism with recannulized RIGHT lower lobe pulmonary artery. RIGHT lower lobe atelectasis, less likely pneumonia or infarct. Persistent RIGHT heart strain.  Bronchial wall thickening can be seen with reactive airway disease or bronchitis, with partial opacification of RIGHT lower lobe distal bronchi.   Electronically Signed   By: Elon Alas M.D.   On: 07/16/2015 05:44   Ct Abdomen Pelvis W Contrast  07/15/2015   CLINICAL DATA:  Abdominal pain around colostomy site. Colostomy was placed about 3 months ago due to diverticulitis. Nausea and increasing generalized weakness.  EXAM: CT ABDOMEN AND PELVIS WITH CONTRAST  TECHNIQUE: Multidetector CT imaging of the abdomen and pelvis was performed using  the standard protocol following bolus administration of intravenous contrast.  CONTRAST:  178mL OMNIPAQUE IOHEXOL 300 MG/ML  SOLN  COMPARISON:  07/08/2014  FINDINGS:  Focal infiltration or consolidation in the right lung base may indicate pneumonia. Calcification in the mitral valve region. Filling defects are demonstrated in the inferior vena cava above the level of a filter. This suggests thrombus/embolus.  Cholelithiasis with multiple stones in the gallbladder. The gallbladder is contracted. Mild diffuse fatty infiltration of the liver. Small accessory spleens. Pancreas, adrenal glands, abdominal aorta, and retroperitoneal lymph nodes are unremarkable. Cysts in the right kidney, largest measuring 6.8 cm maximal diameter. No change since prior study. No hydronephrosis in either kidney. Stomach, small bowel, and colon are not abnormally distended. There is scarring in the anterior abdominal wall consistent with surgical site. Left lower quadrant transverse colostomy with surgical anastomosis suggested at the splenic flexure of the colon. No peristomal hernia. Focal area of increased density in the left upper quadrant adjacent to the anastomosis probably representing fat necrosis or resolving postoperative hematoma. This is measuring about 1.5 cm diameter. No free air or free fluid in the abdomen.  Pelvis: Visualization of the low pelvis is limited due to streak artifact from bilateral hip arthroplasties. No free or loculated pelvic fluid collections are demonstrated. Rectosigmoid colon is decompressed. Uterus is probably surgically absent. No pelvic mass or lymphadenopathy. Diverticula in the sigmoid colon. Degenerative changes in the spine. Mild anterior subluxation of L4 on L5. Appears to be severe bone encroachment upon the central canal at L3-4, L4-5, and L2-3 levels. No destructive bone lesions.  IMPRESSION: Filling defects in the inferior vena cava above the level of on IVC filter suggesting thrombus/ embolus.   Focal area of consolidation in the right lung base could indicate pneumonia or possibly infarct.  Left lower quadrant transverse colostomy. No evidence of proximal obstruction. Probable residual postoperative fluid collection versus fat necrosis in the left upper quadrant adjacent to an anastomosis.  Severe degenerative changes in the spine with probable spinal stenosis.  These results were called by telephone at the time of interpretation on 07/15/2015 at 10:39 pm to Dr. Conni Slipper , who verbally acknowledged these results.   Electronically Signed   By: Lucienne Capers M.D.   On: 07/15/2015 22:42    EKG:  Orders placed or performed during the hospital encounter of 07/15/15  . ED EKG  . ED EKG  . ED EKG  . ED EKG    ASSESSMENT AND PLAN:  Principal Problem:   Abdominal wall cellulitis Active Problems:   DVT (deep venous thrombosis)   Polycythemia vera   Dependent edema   Arthritis   UTI (lower urinary tract infection) 1. RLQ Abdominal pain likely due to acute cystitis versus venous pooling due to IVC filter clot ,  no obvious cellulitis was noted, appreciate vascular surgeon as well as general surgeon input . No obvious infection. Discontinue antibiotic. White blood cell count is still high 2. Suspected IVC filter area/inferior vena cava clot, continue heparin IV, vascular consultation is appreciated, possibly one more IVC filter placement above the clot , but final decision is pending 3. Hyponatremia. Initiate patient on IVs fluids, follow in the morning 4. Leukocytosis. Follow with therapy, get levels that 5. Thrombocytosis possibly reactive, stable  6. Acute cystitis without hematuria.  Following cultures. Discontinue IV antibiotics, start patient on levofloxacin orally 7. Questionable pneumonia. Negative blood cultures 2 days. Sputum cultures is sent. Initiate patient on levofloxacin, adjust antibiotics according to culture results   Management plans discussed with the  patient.   DRUG ALLERGIES: No Known Allergies  CODE STATUS:     Code Status Orders  Start     Ordered   07/16/15 0152  Full code   Continuous     07/16/15 0151      TOTAL TIME TAKING CARE OF THIS PATIENT: 35 minutes.    Theodoro Grist M.D on 07/17/2015 at 12:42 PM  Between 7am to 6pm - Pager - [phone]  After 6pm go to [email or web address] - password [email or web address] [facility]  Office  [phone]  CC: Primary care physician; Renata Caprice, DO

## 2015-07-17 NOTE — Plan of Care (Signed)
Problem: Discharge Progression Outcomes Goal: Other Discharge Outcomes/Goals Plan of care progress to goal: Pain: denies pain Hemodynamically: VSS Diet: tolerated clear liquids Activity: Assist in bed  Continues on heparin drip

## 2015-07-18 LAB — PROTIME-INR
INR: 1.48
Prothrombin Time: 18.1 seconds — ABNORMAL HIGH (ref 11.4–15.0)

## 2015-07-18 LAB — HEPARIN LEVEL (UNFRACTIONATED)
HEPARIN UNFRACTIONATED: 0.56 [IU]/mL (ref 0.30–0.70)
Heparin Unfractionated: 0.63 IU/mL (ref 0.30–0.70)
Heparin Unfractionated: 0.72 IU/mL — ABNORMAL HIGH (ref 0.30–0.70)

## 2015-07-18 LAB — CBC
HEMATOCRIT: 40.6 % (ref 35.0–47.0)
Hemoglobin: 12.6 g/dL (ref 12.0–16.0)
MCH: 24.9 pg — ABNORMAL LOW (ref 26.0–34.0)
MCHC: 31 g/dL — AB (ref 32.0–36.0)
MCV: 80.3 fL (ref 80.0–100.0)
PLATELETS: 804 10*3/uL — AB (ref 150–440)
RBC: 5.06 MIL/uL (ref 3.80–5.20)
RDW: 25.8 % — AB (ref 11.5–14.5)
WBC: 25.2 10*3/uL — AB (ref 3.6–11.0)

## 2015-07-18 LAB — CREATININE, SERUM: Creatinine, Ser: 0.56 mg/dL (ref 0.44–1.00)

## 2015-07-18 MED ORDER — WARFARIN - PHARMACIST DOSING INPATIENT
Freq: Every day | Status: DC
  Administered 2015-07-18 – 2015-07-20 (×2)

## 2015-07-18 MED ORDER — WARFARIN SODIUM 7.5 MG PO TABS: 7.5000 mg | ORAL_TABLET | Freq: Every day | ORAL | Status: DC

## 2015-07-18 MED ORDER — WARFARIN SODIUM 5 MG PO TABS
5.0000 mg | ORAL_TABLET | Freq: Every day | ORAL | Status: DC
  Administered 2015-07-18: 17:00:00 5 mg via ORAL
  Filled 2015-07-18: qty 1

## 2015-07-18 NOTE — Care Management Important Message (Signed)
Important Message  Patient Details  Name: Yolanda Wells MRN: [id number] Date of Birth: August 25, 1932   Medicare Important Message Given:  Yes-third notification given    Juliann Pulse A Allmond 07/18/2015, 11:25 AM

## 2015-07-18 NOTE — Progress Notes (Signed)
ANTICOAGULATION CONSULT NOTE - Initial Consult  Pharmacy Consult for warfarin Indication: DVT  No Known Allergies  Patient Measurements: Height: 5\' 7"  (170.2 cm) Weight: 213 lb 14.4 oz (97.024 kg) IBW/kg (Calculated) : 61.6   Vital Signs: Temp: 98.7 F (37.1 C) (09/07 1223) Temp Source: Oral (09/07 1223) BP: 124/71 mmHg (09/07 1223) Pulse Rate: 94 (09/07 1223)  Labs:  Recent Labs  07/15/15 2014 07/16/15 0815 07/16/15 1541  07/16/15 2318 07/17/15 0106 07/17/15 0514 07/17/15 1652 07/18/15 0328 07/18/15 1055  HGB 13.4 12.7  --   --   --  12.8  --   --  12.6  --   HCT 43.8 40.8  --   --   --  40.8  --   --  40.6  --   PLT 889* 854*  --   --   --  762*  --   --  804*  --   HEPARINUNFRC  --  0.81*  --   < >  --  0.42  --  0.18* 0.63 0.72*  CREATININE 0.73 0.57  --   --   --   --   --   --  0.56  --   TROPONINI 0.04*  --  <0.03  --  <0.03  --  0.04*  --   --   --   < > = values in this interval not displayed.  Estimated Creatinine Clearance: 66 mL/min (by C-G formula based on Cr of 0.56).   Medical History: Past Medical History  Diagnosis Date  . Hypertension   . Deep venous thrombosis     right lower extremity  . History of hysterectomy   . History of bilateral hip replacements   . Polycythemia vera   . Dependent edema   . Arthritis      Assessment: Yolanda Wells is a 81yo female being treated for inferior vena cava clot. Patient was previously on rivaroxaban 20mg  daily, but failed therapy. MD wants to initiate warfarin for treatment, pharmacy consulted. Patient is currently on heparin drip, 1400units/hr, and almost at goal level of 0.3-0.7. Patient is currently on lexofloxacin, platelets are 804, Hgb 12.6 and Hct 40.6.    Goal of Therapy:  INR 2-3 Monitor platelets by anticoagulation protocol: Yes   Plan:  Will start patient on 5mg  warfarin at 1800 today. PT-INR ordered now for baseline level and for 0500 tomorrow. Pharmacy will continue to monitor and adjust dosing  as needed.     Nancy Fetter, PharmD Pharmacy Resident

## 2015-07-18 NOTE — Progress Notes (Signed)
[facility] at [place] NAME: Yolanda Wells    MR#:  [id number]  DATE OF BIRTH:  Feb 04, 1932  SUBJECTIVE: no complains today.  CHIEF COMPLAINT:   Chief Complaint  Patient presents with  . Abdominal Pain   the patient is 81 year old African-American female with past medical history significant for history of colostomy placed due to gastrointestinal bleed approximately 3 or 4 months ago per patient's recollection, who presents to the hospital with complaints of abdominal pain, dysuria. Patient's labs were remarkable for markedly elevated white blood cell count and mild elevation of troponin. She underwent CT scan of abdomen which revealed filling defect concerning for clot around IVC filter,  being continued on heparin . Per vascular - as the thrombus is extending higher- second IVC filter is not an option.    ROS  VITAL SIGNS: Blood pressure 124/71, pulse 94, temperature 98.7 F (37.1 C), temperature source Oral, resp. rate 18, height 5\' 7"  (1.702 m), weight 97.024 kg (213 lb 14.4 oz), SpO2 100 %.  PHYSICAL EXAMINATION:   GENERAL:  81 y.o.-year-old patient lying in the bed with no acute distress. Dysarthric EYES: Pupils equal, round, reactive to light and accommodation. No scleral icterus. Extraocular muscles intact.  HEENT: Head atraumatic, normocephalic. Oropharynx and nasopharynx clear.  NECK:  Supple, no jugular venous distention. No thyroid enlargement, no tenderness.  LUNGS: Normal breath sounds bilaterally, no wheezing, rales,rhonchi or crepitation. No use of accessory muscles of respiration.  CARDIOVASCULAR: S1, S2 normal. No murmurs, rubs, or gallops.  ABDOMEN: Soft, mild tenderness noted in the right lower quadrant but no rebound or guarding was noted. No significant tenderness around colostomy site or in left lower quadrant, nondistended. Stool is in colostomy bag . Bowel sounds present. No organomegaly or mass. not any significant  skin abnormalities around the colostomy site.  EXTREMITIES: No pedal edema, cyanosis, or clubbing. Right transmetatarsal amputaion present. NEUROLOGIC: Cranial nerves II through XII are intact. Muscle strength 4/5 in all extremities. Sensation intact. Gait not checked.  PSYCHIATRIC: The patient is alert and oriented x 3.  SKIN: No obvious rash, lesion, or ulcer.   ORDERS/RESULTS REVIEWED:   CBC  Recent Labs Lab 07/15/15 2014 07/16/15 0815 07/17/15 0106 07/18/15 0328  WBC 23.5* 22.7* 22.6* 25.2*  HGB 13.4 12.7 12.8 12.6  HCT 43.8 40.8 40.8 40.6  PLT 889* 854* 762* 804*  MCV 79.5* 78.8* 81.0 80.3  MCH 24.3* 24.5* 25.4* 24.9*  MCHC 30.6* 31.1* 31.3* 31.0*  RDW 26.9* 26.6* 25.9* 25.8*  LYMPHSABS 1.9  --   --   --   MONOABS 0.7  --   --   --   EOSABS 0.3  --   --   --   BASOSABS 0.1  --   --   --    ------------------------------------------------------------------------------------------------------------------  Chemistries   Recent Labs Lab 07/15/15 2014 07/16/15 0815 07/17/15 0514 07/18/15 0328  NA 139 134* 129*  --   K 4.1 3.9  --   --   CL 104 103  --   --   CO2 28 26  --   --   GLUCOSE 114* 95  --   --   BUN 9 7  --   --   CREATININE 0.73 0.57  --  0.56  CALCIUM 8.8* 8.3*  --   --   AST 24  --   --   --   ALT 10*  --   --   --  ALKPHOS 112  --   --   --   BILITOT 0.4  --   --   --    ------------------------------------------------------------------------------------------------------------------ estimated creatinine clearance is 66 mL/min (by C-G formula based on Cr of 0.56). ------------------------------------------------------------------------------------------------------------------ No results for input(s): TSH, T4TOTAL, T3FREE, THYROIDAB in the last 72 hours.  Invalid input(s): FREET3  Cardiac Enzymes  Recent Labs Lab 07/16/15 1541 07/16/15 2318 07/17/15 0514  TROPONINI <0.03 <0.03 0.04*    ------------------------------------------------------------------------------------------------------------------ Invalid input(s): POCBNP ---------------------------------------------------------------------------------------------------------------  RADIOLOGY: No results found.   ASSESSMENT AND PLAN:  1. RLQ Abdominal pain likely due to acute cystitis versus venous pooling due to IVC filter clot ,  no obvious cellulitis was noted, appreciate vascular surgeon as well as general surgeon input . No obvious infection. Discontinue antibiotic. White blood cell count is still high- likely due to blood clot. 2. 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 3. Hyponatremia. Initiate patient on IVs fluids, follow in the morning 4. Leukocytosis. Follow with therapy, get levels that 5. Thrombocytosis possibly reactive, stable  6. Acute cystitis without hematuria.  Following cultures. Discontinue IV antibiotics, start patient on levofloxacin orally 7. Questionable pneumonia. Negative blood cultures 2 days. Sputum cultures is sent. Initiate patient on levofloxacin, adjust antibiotics according to culture results   Management plans discussed with the patient. As per Education officer, museum- she may need more help at home.  DRUG ALLERGIES: No Known Allergies  CODE STATUS:     Code Status Orders        Start     Ordered   07/16/15 0152  Full code   Continuous     07/16/15 0151      TOTAL TIME TAKING CARE OF THIS PATIENT: 35 minutes.    Vaughan Basta M.D on 07/18/2015 at 1:30 PM  Between 7am to 6pm - Pager - [phone]  After 6pm go to [email or web address] - password [email or web address] [facility]  Office  [phone]  CC: Primary care physician; Renata Caprice, DO

## 2015-07-18 NOTE — Progress Notes (Signed)
ANTICOAGULATION CONSULT NOTE -Follow up [place] for heparin Indication: DVT  No Known Allergies  Patient Measurements: Height: 5\' 7"  (170.2 cm) Weight: 213 lb 14.4 oz (97.024 kg) IBW/kg (Calculated) : 61.6 Heparin Dosing Weight: 83.5 kg  Vital Signs: Temp: 98.1 F (36.7 C) (09/07 0444) Temp Source: Oral (09/07 0444) BP: 126/64 mmHg (09/07 0444) Pulse Rate: 91 (09/07 0444)  Labs:  Recent Labs  07/15/15 2014 07/16/15 0815 07/16/15 1541  07/16/15 2318 07/17/15 0106 07/17/15 0514 07/17/15 1652 07/18/15 0328 07/18/15 1055  HGB 13.4 12.7  --   --   --  12.8  --   --  12.6  --   HCT 43.8 40.8  --   --   --  40.8  --   --  40.6  --   PLT 889* 854*  --   --   --  762*  --   --  804*  --   HEPARINUNFRC  --  0.81*  --   < >  --  0.42  --  0.18* 0.63 0.72*  CREATININE 0.73 0.57  --   --   --   --   --   --  0.56  --   TROPONINI 0.04*  --  <0.03  --  <0.03  --  0.04*  --   --   --   < > = values in this interval not displayed.  Estimated Creatinine Clearance: 66 mL/min (by C-G formula based on Cr of 0.56).   Medical History: Past Medical History  Diagnosis Date  . Hypertension   . Deep venous thrombosis     right lower extremity  . History of hysterectomy   . History of bilateral hip replacements   . Polycythemia vera   . Dependent edema   . Arthritis     Medications:  Infusions:  . heparin 1,500 Units/hr (07/18/15 0257)    Assessment: 83 yof cc abdominal pain with radiologic evidence of blood clot in inferior vena cava above her IVC filter. Pharmacy consulted for heparin dosing and monitoring. Current dose heparin 1500 units/hr.  Goal of Therapy:  Heparin level 0.3-0.7 units/ml Monitor platelets by anticoagulation protocol: Yes   Plan:   09/7: 03:00 anti-Xa 0.63. Recheck in 8 hours to confirm.    09/07: 1100 anti-Xa 0.72. Will lower heparin dose to 1400 units/hr as directed by heparin dosing protocol. Next anti-Xa level will be ordered  for 2100 today (07/18/15). Pharmacy to continue to monitoring and adjust heparin.   Yolanda Wells, Pharm.D.  Clinical Pharmacist 07/18/2015,12:22 PM

## 2015-07-18 NOTE — Progress Notes (Signed)
ANTICOAGULATION CONSULT NOTE -Follow up [place] for heparin Indication: DVT  No Known Allergies  Patient Measurements: Height: 5\' 7"  (170.2 cm) Weight: 213 lb 14.4 oz (97.024 kg) IBW/kg (Calculated) : 61.6 Heparin Dosing Weight: 83.5 kg  Vital Signs: Temp: 98.4 F (36.9 C) (09/06 2116) Temp Source: Oral (09/06 2116) BP: 143/81 mmHg (09/06 2116) Pulse Rate: 98 (09/06 2116)  Labs:  Recent Labs  07/15/15 2014 07/16/15 0815 07/16/15 1541  07/16/15 2318 07/17/15 0106 07/17/15 0514 07/17/15 1652 07/18/15 0328  HGB 13.4 12.7  --   --   --  12.8  --   --  12.6  HCT 43.8 40.8  --   --   --  40.8  --   --  40.6  PLT 889* 854*  --   --   --  762*  --   --  804*  HEPARINUNFRC  --  0.81*  --   < >  --  0.42  --  0.18* 0.63  CREATININE 0.73 0.57  --   --   --   --   --   --  0.56  TROPONINI 0.04*  --  <0.03  --  <0.03  --  0.04*  --   --   < > = values in this interval not displayed.  Estimated Creatinine Clearance: 66 mL/min (by C-G formula based on Cr of 0.56).   Medical History: Past Medical History  Diagnosis Date  . Hypertension   . Deep venous thrombosis     right lower extremity  . History of hysterectomy   . History of bilateral hip replacements   . Polycythemia vera   . Dependent edema   . Arthritis     Medications:  Infusions:  . heparin 1,500 Units/hr (07/18/15 0257)    Assessment: 83 yof cc abdominal pain with radiologic evidence of blood clot in inferior vena cava above her IVC filter.   Goal of Therapy:  Heparin level 0.3-0.7 units/ml Monitor platelets by anticoagulation protocol: Yes   Plan:  Heparin level is above goal so will decrease infusion to 1250 units/hr and recheck a HL in 8 hours.   9/5: Heparin level at 1655=0.60. Will recheck level in 8 hrs for confirmation of therapeutic level.   9/6 0100 heparin level 0.42. Recheck with CBC in AM.  9/6: HL @ 17:00 = 0.18 Will order Heparin 2500 units IV X 1 bolus and increase  drip rate to 1500 units/hr. Will draw next HL 8 hrs after rate change on 9/7 @ 3:00.  9/7 03:00 anti-Xa 0.63. Recheck in 8 hours to confirm.    Eran Mistry S, Pharm.D.  Clinical Pharmacist 07/18/2015,4:39 AM

## 2015-07-18 NOTE — Plan of Care (Signed)
Problem: Discharge Progression Outcomes Goal: Other Discharge Outcomes/Goals Outcome: Progressing Plan of Care Progress to Goal:  Pt is A & O.  WBC rising.  On PO levaquin.  Still on heparin drip - adjusted to 14.  Pt to start on warfarin. Vascular surgeon stated that 2nd IVC filter isn't an option.   Dr has ordered PT eval.  C/o headache - resolved w/tylenol.  Pt desiring Healthcare POA.

## 2015-07-18 NOTE — Progress Notes (Signed)
[facility]  Daily Progress Note   Subjective  -patient denies new shortness of breath no chest pain. Now on Coumadin  Objective Filed Vitals:   07/17/15 1528 07/17/15 2116 07/18/15 0444 07/18/15 1223  BP: 118/70 143/81 126/64 124/71  Pulse: 89 98 91 94  Temp: 99.4 F (37.4 C) 98.4 F (36.9 C) 98.1 F (36.7 C) 98.7 F (37.1 C)  TempSrc: Oral Oral Oral Oral  Resp: 20 18 18 18   Height:      Weight:      SpO2: 97% 92% 93% 100%    Intake/Output Summary (Last 24 hours) at 07/18/15 1949 Last data filed at 07/18/15 1700  Gross per 24 hour  Intake    243 ml  Output    200 ml  Net     43 ml    PULM  Normal effort , no use of accessory muscles CV  No JVD, RRR Abd      No distended, nontender VASC  Lower extremities pink and warm 2+ edema  Laboratory CBC    Component Value Date/Time   WBC 25.2* 07/18/2015 0328   WBC 6.4 04/10/2015   WBC 3.7 07/08/2014 0936   HGB 12.6 07/18/2015 0328   HGB 13.8 07/08/2014 0936   HCT 40.6 07/18/2015 0328   HCT 42.0 07/08/2014 0936   PLT 804* 07/18/2015 0328   PLT 834* 07/08/2014 0936    BMET    Component Value Date/Time   NA 129* 07/17/2015 0514   NA 137 04/10/2015   NA 144 07/08/2014 0936   K 3.9 07/16/2015 0815   K 3.7 07/08/2014 0936   CL 103 07/16/2015 0815   CL 108* 07/08/2014 0936   CO2 26 07/16/2015 0815   CO2 27 07/08/2014 0936   GLUCOSE 95 07/16/2015 0815   GLUCOSE 90 07/08/2014 0936   BUN 7 07/16/2015 0815   BUN 6 04/10/2015   BUN 15 07/08/2014 0936   CREATININE 0.56 07/18/2015 0328   CREATININE 0.4* 04/10/2015   CREATININE 0.64 07/08/2014 0936   CALCIUM 8.3* 07/16/2015 0815   CALCIUM 8.4* 07/08/2014 0936   GFRNONAA >60 07/18/2015 0328   GFRNONAA >60 07/08/2014 0936   GFRAA >60 07/18/2015 0328   GFRAA >60 07/08/2014 0936    Assessment/Planning:   Thrombus above the IVC filter         She has two discrete filling defects (which are likely connected) the upper of which is larger  than the one that appears attached to the filter.  More importantly the second thrombus extends to the level of the atrium and is in the location that a suprarenal filter would be placed.  This negates the possibility of placing a second filter.  At this point I would continue the heparin gtt and begin Coumadin.  I feel that she has now failed Xarelto for a second time and the ability to monitor an INR will be helpful to ensure she is adequately anticoagulated.     Adryan Druckenmiller, Dolores Lory  07/18/2015, 7:49 PM

## 2015-07-18 NOTE — Progress Notes (Signed)
ANTICOAGULATION CONSULT NOTE -Follow up [place] for heparin Indication: DVT  No Known Allergies  Patient Measurements: Height: 5\' 7"  (170.2 cm) Weight: 213 lb 14.4 oz (97.024 kg) IBW/kg (Calculated) : 61.6 Heparin Dosing Weight: 83.5 kg  Vital Signs: Temp: 98.2 F (36.8 C) (09/07 2140) Temp Source: Oral (09/07 2140) BP: 143/77 mmHg (09/07 2140) Pulse Rate: 82 (09/07 2140)  Labs:  Recent Labs  07/16/15 0815 07/16/15 1541  07/16/15 2318 07/17/15 0106 07/17/15 0514  07/18/15 0328 07/18/15 1055 07/18/15 1436 07/18/15 2058  HGB 12.7  --   --   --  12.8  --   --  12.6  --   --   --   HCT 40.8  --   --   --  40.8  --   --  40.6  --   --   --   PLT 854*  --   --   --  762*  --   --  804*  --   --   --   LABPROT  --   --   --   --   --   --   --   --   --  18.1*  --   INR  --   --   --   --   --   --   --   --   --  1.48  --   HEPARINUNFRC 0.81*  --   < >  --  0.42  --   < > 0.63 0.72*  --  0.56  CREATININE 0.57  --   --   --   --   --   --  0.56  --   --   --   TROPONINI  --  <0.03  --  <0.03  --  0.04*  --   --   --   --   --   < > = values in this interval not displayed.  Estimated Creatinine Clearance: 66 mL/min (by C-G formula based on Cr of 0.56).   Medical History: Past Medical History  Diagnosis Date  . Hypertension   . Deep venous thrombosis     right lower extremity  . History of hysterectomy   . History of bilateral hip replacements   . Polycythemia vera   . Dependent edema   . Arthritis     Medications:  Infusions:  . heparin 1,400 Units/hr (07/18/15 2028)    Assessment: 83 yof cc abdominal pain with radiologic evidence of blood clot in inferior vena cava above her IVC filter.   Goal of Therapy:  Heparin level 0.3-0.7 units/ml Monitor platelets by anticoagulation protocol: Yes   Plan:  Heparin level is above goal so will decrease infusion to 1250 units/hr and recheck a HL in 8 hours.   9/5: Heparin level at 1655=0.60. Will  recheck level in 8 hrs for confirmation of therapeutic level.   9/6 0100 heparin level 0.42. Recheck with CBC in AM.  9/6: HL @ 17:00 = 0.18 Will order Heparin 2500 units IV X 1 bolus and increase drip rate to 1500 units/hr. Will draw next HL 8 hrs after rate change on 9/7 @ 3:00.  9/7 03:00 anti-Xa 0.63. Recheck in 8 hours to confirm.    9/7 :   HL @ 21:00 = 0.56 Will continue this pt on current rate of 1400 units/hr and recheck HL on 9/8 with AM labs.   Linkon Siverson D, Pharm.D.  Clinical Pharmacist 07/18/2015,9:46  PM   

## 2015-07-18 NOTE — Progress Notes (Signed)
   07/18/15 1037  Clinical Encounter Type  Visited With Patient  Visit Type Initial  Referral From Nurse  Consult/Referral To Chaplain  Spiritual Encounters  Spiritual Needs Literature  Advance Directives (For Healthcare)  Does patient have an advance directive? No  Provided HCPOA/Living will forms and education.  Pt may complete at a later time.  Will call us when ready to complete.  [place] [phone]

## 2015-07-18 NOTE — Clinical Social Work Note (Signed)
Clinical Social Work Assessment  Patient Details  Name: Yolanda Wells MRN: [id number] Date of Birth: [birth year]  Date of referral:  07/18/15               Reason for consult:  Facility Placement                Permission sought to share information with:  Facility Art therapist granted to share information::  Yes, Verbal Permission Granted  Name::      [facility].   Agency::     Relationship::     Contact Information:     Housing/Transportation Living arrangements for the past 2 months:  [place], Charity fundraiser of Information:  Other (Comment Required) Advertising account executive ) Patient Interpreter Needed:  None Criminal Activity/Legal Involvement Pertinent to Current Situation/Hospitalization:  No - Comment as needed Significant Relationships:  Other Family Members Lives with:  Self Do you feel safe going back to the place where you live?  Yes Need for family participation in patient care:  Yes (Comment)  Care giving concerns: Patient is from [place] at [facility].    Social Worker assessment / plan: Holiday representative (CSW) received call from patient's granddaughter Yolanda Wells [phone]. Per Yolanda Wells patient lives in [place] at [facility] alone and has used all her Medicare SNF days at [facility] and [facility]. Yolanda Wells reported that she does not want to apply for Medicaid and stated that patient can pay privately for ALF. Yolanda Wells is currently working on ALF placement. Per Yolanda Wells she is touring a ALF facility in [place] today. Per Yolanda Wells she is working with a Publishing copy. Yolanda Wells reported that she would like for patient to go home with home health through [employer] and private duty sitters. CSW made RN Case Manager aware of above. Plan is for patient to D/C home from hospital and [place] will work on ALF placement. Per MD patient will not require another IVC filter and  may be ready for D/C tomorrow.   FL2 complete and signed. CSW will continue to follow and assist as needed.    Employment status:  Retired Forensic scientist:  Medicare PT Recommendations:  Not assessed at this time Information / Referral to community resources:     Patient/Family's Response to care: Patient's granddaughter Yolanda Wells is agreeable for patient to return home.   Patient/Family's Understanding of and Emotional Response to Diagnosis, Current Treatment, and Prognosis: Granddaughter Yolanda Wells was pleasant throughout assessment and thanked CSW for assistance.   Emotional Assessment Appearance:  Appears stated age Attitude/Demeanor/Rapport:    Affect (typically observed):  Accepting, Pleasant Orientation:  Oriented to Self, Oriented to Place, Oriented to  Time, Oriented to Situation Alcohol / Substance use:  Not Applicable Psych involvement (Current and /or in the community):  No (Comment)  Discharge Needs  Concerns to be addressed:  Discharge Planning Concerns Readmission within the last 30 days:  No Current discharge risk:  Chronically ill Barriers to Discharge:  Continued Medical Work up   Loralyn Freshwater, LCSW 07/18/2015, 3:09 PM

## 2015-07-18 NOTE — Plan of Care (Signed)
Problem: Discharge Progression Outcomes Goal: Other Discharge Outcomes/Goals Outcome: Progressing Plan of Care Progress to Goal:   Pt denies pain. Pt is currently therapeutic on the heparin drip. Pt is concern about the bank refusing to allow her granddaughter to do transaction on her behalf. Pt did not rest well last night. Pt is incontinent and still needs urine culture. Per Merry Proud in the lab, there is no urine sample for pt currently. No other signs of distress noted. Will continue to monitor.

## 2015-07-19 LAB — PROTIME-INR
INR: 1.39
PROTHROMBIN TIME: 17.3 s — AB (ref 11.4–15.0)

## 2015-07-19 LAB — CBC
HCT: 40.5 % (ref 35.0–47.0)
HEMOGLOBIN: 13 g/dL (ref 12.0–16.0)
MCH: 25.5 pg — ABNORMAL LOW (ref 26.0–34.0)
MCHC: 32.1 g/dL (ref 32.0–36.0)
MCV: 79.4 fL — ABNORMAL LOW (ref 80.0–100.0)
Platelets: 796 10*3/uL — ABNORMAL HIGH (ref 150–440)
RBC: 5.1 MIL/uL (ref 3.80–5.20)
RDW: 26.2 % — AB (ref 11.5–14.5)
WBC: 25 10*3/uL — ABNORMAL HIGH (ref 3.6–11.0)

## 2015-07-19 LAB — HEPARIN LEVEL (UNFRACTIONATED): HEPARIN UNFRACTIONATED: 0.44 [IU]/mL (ref 0.30–0.70)

## 2015-07-19 MED ORDER — WARFARIN SODIUM 5 MG PO TABS
5.0000 mg | ORAL_TABLET | Freq: Every day | ORAL | Status: DC
  Administered 2015-07-19 – 2015-07-20 (×2): 5 mg via ORAL
  Filled 2015-07-19 (×2): qty 1

## 2015-07-19 MED ORDER — WARFARIN SODIUM 7.5 MG PO TABS: 7.5000 mg | ORAL_TABLET | Freq: Once | ORAL | Status: DC

## 2015-07-19 NOTE — Evaluation (Signed)
Physical Therapy Evaluation Patient Details Name: Yolanda Wells MRN: [id number] DOB: [birth year] Today's Date: 07/19/2015   History of Present Illness  81 yo female with onset of weakness from suspected PE vs PNA, has cellulitis abdominally   Clinical Impression  Pt was able to get up to bedside with PT but cannot get to chair due to her inability to stand and her difficulty sliding from bedside across to chair.  Pt will need lift equipment for nursing to get her to chair unless she is feeling better and better able to focus.    Follow Up Recommendations SNF    Equipment Recommendations  None recommended by PT    Recommendations for Other Services       Precautions / Restrictions Precautions Precautions: Fall (telemetry) Restrictions Weight Bearing Restrictions: No      Mobility  Bed Mobility Overal bed mobility: Needs Assistance Bed Mobility: Supine to Sit;Sit to Supine     Supine to sit: Max assist Sit to supine: Max assist   General bed mobility comments: assist to sequence and for trunk supprt and to move LE's  Transfers Overall transfer level: Needs assistance Equipment used: Rolling walker (2 wheeled);1 person hand held assist Transfers: Sit to/from Stand;Lateral/Scoot Transfers Sit to Stand: Total assist;From elevated surface (partial standign only)        Lateral/Scoot Transfers: Mod assist;Max assist;From elevated surface General transfer comment: Pt is struggling to move her legs and to lift hips but is motivated to try.  Will benefit family and pt to have hospital bed to move pt  Ambulation/Gait             General Gait Details: unable  Stairs            Wheelchair Mobility    Modified Rankin (Stroke Patients Only)       Balance Overall balance assessment: Needs assistance Sitting-balance support: Feet supported;Bilateral upper extremity supported Sitting balance-Leahy Scale: Poor                                        Pertinent Vitals/Pain Pain Assessment: Faces Faces Pain Scale: Hurts a little bit Pain Location: abdomen Pain Intervention(s): Monitored during session;Repositioned    Home Living Family/patient expects to be discharged to:: Skilled nursing facility                      Prior Function Level of Independence: Independent with assistive device(s)         Comments: Uses rollator for short distances (up to 50 feet) and motorized wheelchair for longer distances (pt reports she has not ambulated since going to rehab recently)     Hand Dominance        Extremity/Trunk Assessment   Upper Extremity Assessment: Generalized weakness           Lower Extremity Assessment: Generalized weakness      Cervical / Trunk Assessment: Normal  Communication   Communication: No difficulties (fairly good following questions)  Cognition Arousal/Alertness: Awake/alert Behavior During Therapy: WFL for tasks assessed/performed Overall Cognitive Status: Within Functional Limits for tasks assessed                      General Comments General comments (skin integrity, edema, etc.): Pt has an ostomy and is unaware of how to care for it.  Her PLOF was SNF then has run out of days  to continue rehab.      Exercises        Assessment/Plan    PT Assessment Patient needs continued PT services  PT Diagnosis Generalized weakness   PT Problem List Decreased strength;Decreased range of motion;Decreased activity tolerance;Decreased balance;Decreased mobility;Decreased coordination;Cardiopulmonary status limiting activity;Obesity;Pain  PT Treatment Interventions DME instruction;Gait training;Functional mobility training;Therapeutic activities;Therapeutic exercise;Balance training;Neuromuscular re-education;Patient/family education   PT Goals (Current goals can be found in the Care Plan section) Acute Rehab PT Goals Patient Stated Goal: to walk again PT Goal Formulation: With  patient Time For Goal Achievement: 08/02/15 Potential to Achieve Goals: Good    Frequency Min 2X/week   Barriers to discharge Decreased caregiver support (has a granddaughter and will need 2 person assist)      Co-evaluation               End of Session Equipment Utilized During Treatment: Gait belt Activity Tolerance: Patient tolerated treatment well;Patient limited by fatigue Patient left: in bed;with call bell/phone within reach;with bed alarm set Nurse Communication: Mobility status         Time: 7681-1572 PT Time Calculation (min) (ACUTE ONLY): 29 min   Charges:   PT Evaluation $Initial PT Evaluation Tier I: 1 Procedure PT Treatments $Therapeutic Activity: 8-22 mins   PT G Codes:        Ramond Dial 2015-07-30, 12:28 PM   Mee Hives, PT MS Acute Rehab Dept. Number: [phone] [id number] and [place] [phone]

## 2015-07-19 NOTE — Progress Notes (Signed)
   07/19/15 1100  Clinical Encounter Type  Visited With Patient and family together  Visit Type Follow-up  Referral From Family  Consult/Referral To Chaplain  Advance Directives (For Healthcare)  Does patient have an advance directive? Yes  Copy of advanced directive(s) in chart? Yes  Completed Advance Directive for patient.  Copy put into pt's chart.  Original given to patient and family.  [place] [phone]

## 2015-07-19 NOTE — Progress Notes (Signed)
ANTICOAGULATION CONSULT NOTE -Follow up [place] for heparin Indication: DVT  No Known Allergies  Patient Measurements: Height: 5\' 7"  (170.2 cm) Weight: 213 lb 14.4 oz (97.024 kg) IBW/kg (Calculated) : 61.6 Heparin Dosing Weight: 83.5 kg  Vital Signs: Temp: 98.1 F (36.7 C) (09/08 0446) Temp Source: Oral (09/08 0446) BP: 144/69 mmHg (09/08 0446) Pulse Rate: 87 (09/08 0446)  Labs:  Recent Labs  07/16/15 0815 07/16/15 1541  07/16/15 2318 07/17/15 0106 07/17/15 0514  07/18/15 0328 07/18/15 1055 07/18/15 1436 07/18/15 2058 07/19/15 0523  HGB 12.7  --   --   --  12.8  --   --  12.6  --   --   --  13.0  HCT 40.8  --   --   --  40.8  --   --  40.6  --   --   --  40.5  PLT 854*  --   --   --  762*  --   --  804*  --   --   --  796*  LABPROT  --   --   --   --   --   --   --   --   --  18.1*  --  17.3*  INR  --   --   --   --   --   --   --   --   --  1.48  --  1.39  HEPARINUNFRC 0.81*  --   < >  --  0.42  --   < > 0.63 0.72*  --  0.56 0.44  CREATININE 0.57  --   --   --   --   --   --  0.56  --   --   --   --   TROPONINI  --  <0.03  --  <0.03  --  0.04*  --   --   --   --   --   --   < > = values in this interval not displayed.  Estimated Creatinine Clearance: 66 mL/min (by C-G formula based on Cr of 0.56).   Medical History: Past Medical History  Diagnosis Date  . Hypertension   . Deep venous thrombosis     right lower extremity  . History of hysterectomy   . History of bilateral hip replacements   . Polycythemia vera   . Dependent edema   . Arthritis     Medications:  Infusions:  . heparin 1,400 Units/hr (07/18/15 2028)    Assessment: 83 yof cc abdominal pain with radiologic evidence of blood clot in inferior vena cava above her IVC filter.   Goal of Therapy:  Heparin level 0.3-0.7 units/ml Monitor platelets by anticoagulation protocol: Yes   Plan:  Heparin level is above goal so will decrease infusion to 1250 units/hr and recheck a  HL in 8 hours.   9/5: Heparin level at 1655=0.60. Will recheck level in 8 hrs for confirmation of therapeutic level.   9/6 0100 heparin level 0.42. Recheck with CBC in AM.  9/6: HL @ 17:00 = 0.18 Will order Heparin 2500 units IV X 1 bolus and increase drip rate to 1500 units/hr. Will draw next HL 8 hrs after rate change on 9/7 @ 3:00.  9/7 03:00 anti-Xa 0.63. Recheck in 8 hours to confirm.    9/7 :   HL @ 21:00 = 0.56 Will continue this pt on current rate of 1400 units/hr and recheck HL  on 9/8 with AM labs.   9/8 AM anti-Xa 0.44. Continue current regimen and check CBC and anti-Xa tomorrow AM.  Penn Grissett S, Pharm.D.  Clinical Pharmacist 07/19/2015,6:00 AM

## 2015-07-19 NOTE — Progress Notes (Signed)
Clinical Education officer, museum (CSW) met with patient's granddaughter [name] today. Per [name] the plan is to take patient home with hired caregivers and home health. [name] is aware that patient is out of SNF rehab days. [name] is currently working on finding ALF placement and working with a Publishing copy. CSW gave [name] FL2 to help with ALF referral. CSW notarized advanced directive for patient. CSW also gave granddaughter phone numbers to the Medicaid workers [place] [facility] [place] in [facility] RN Case Manager aware of above. CSW will continue to follow and assist as needed.   Blima Rich, [place] [phone]

## 2015-07-19 NOTE — Plan of Care (Signed)
Problem: Discharge Progression Outcomes Goal: Other Discharge Outcomes/Goals Outcome: Progressing Plan of care progress to goals: 1. No c/o pain or discomfort.  2. Hemodynamically:               -VSS, afebrile              -Heparin drip continues at 1400units/hr, Coumadin started yesterday; PT/INR to be drawn this morning              -WBC trending up, PO Abx             -incontinent, urine sample still pending 3. Tolerating regular diet well.  4. Extreme generalized weakness, maximum assist. PT consulted to determine d/c needs. Bed alarm on, hourly rounding.

## 2015-07-19 NOTE — Progress Notes (Addendum)
ANTICOAGULATION CONSULT NOTE - Initial Consult  Pharmacy Consult for warfarin Indication: DVT  No Known Allergies  Patient Measurements: Height: 5\' 7"  (170.2 cm) Weight: 213 lb 14.4 oz (97.024 kg) IBW/kg (Calculated) : 61.6   Vital Signs: Temp: 98.1 F (36.7 C) (09/08 0446) Temp Source: Oral (09/08 0446) BP: 144/69 mmHg (09/08 0446) Pulse Rate: 87 (09/08 0446)  Labs:  Recent Labs  07/16/15 0815 07/16/15 1541  07/16/15 2318 07/17/15 0106 07/17/15 0514  07/18/15 0328 07/18/15 1055 07/18/15 1436 07/18/15 2058 07/19/15 0523  HGB 12.7  --   --   --  12.8  --   --  12.6  --   --   --  13.0  HCT 40.8  --   --   --  40.8  --   --  40.6  --   --   --  40.5  PLT 854*  --   --   --  762*  --   --  804*  --   --   --  796*  LABPROT  --   --   --   --   --   --   --   --   --  18.1*  --  17.3*  INR  --   --   --   --   --   --   --   --   --  1.48  --  1.39  HEPARINUNFRC 0.81*  --   < >  --  0.42  --   < > 0.63 0.72*  --  0.56 0.44  CREATININE 0.57  --   --   --   --   --   --  0.56  --   --   --   --   TROPONINI  --  <0.03  --  <0.03  --  0.04*  --   --   --   --   --   --   < > = values in this interval not displayed.  Estimated Creatinine Clearance: 66 mL/min (by C-G formula based on Cr of 0.56).   Medical History: Past Medical History  Diagnosis Date  . Hypertension   . Deep venous thrombosis     right lower extremity  . History of hysterectomy   . History of bilateral hip replacements   . Polycythemia vera   . Dependent edema   . Arthritis      Assessment: Yolanda Wells is a 81yo female being treated for inferior vena cava clot. Patient was previously on rivaroxaban 20mg  daily, but failed therapy. MD wants to initiate warfarin for treatment, pharmacy consulted. Patient is currently on heparin drip, 1400units/hr, and almost at goal level of 0.3-0.7. Patient is currently on lexofloxacin, platelets are 804, Hgb 12.6 and Hct 40.6.    Goal of Therapy:  INR 2-3 Monitor  platelets by anticoagulation protocol: Yes   Plan:  Will start patient on 5mg  warfarin at 1800 today. PT-INR ordered now for baseline level and for 0500 tomorrow. Pharmacy will continue to monitor and adjust dosing as needed.     9/8 INR 1.39. Continue current regimen and f/u INR in AM.  Sim Boast, PharmD, BCPS  07/19/2015    Addendum: MD entered order for warfarin 7.5 mg PO x1 for today. Discussed with MD; likely have not seen full effects of yesterday's dose in this elderly pt with low albumin. Pt also on levofloxacin.  Will order warfarin 5 mg PO for tonight Hgb  and plt count have been stable.   Rayna Sexton, PharmD, BCPS Clinical Pharmacist 07/19/2015 2:04 PM

## 2015-07-19 NOTE — Progress Notes (Signed)
[facility] [place] NAME: Yolanda Wells    MR#:  [id number]  DATE OF BIRTH:  01/27/1932  SUBJECTIVE: no complains today.  CHIEF COMPLAINT:   Chief Complaint  Patient presents with  . Abdominal Pain   the patient is 81 year old African-American female with past medical history significant for history of colostomy placed due to gastrointestinal bleed approximately 3 or 4 months ago per patient's recollection, who presents to the hospital with complaints of abdominal pain, dysuria. Patient's labs were remarkable for markedly elevated white blood cell count and mild elevation of troponin. She underwent CT scan of abdomen which revealed filling defect concerning for clot around IVC filter,  being continued on heparin . Per vascular - as the thrombus is extending higher- second IVC filter is not an option.    ROS  VITAL SIGNS: Blood pressure 131/70, pulse 87, temperature 97.9 F (36.6 C), temperature source Oral, resp. rate 20, height 5\' 7"  (1.702 m), weight 97.024 kg (213 lb 14.4 oz), SpO2 99 %.  PHYSICAL EXAMINATION:   GENERAL:  81 y.o.-year-old patient lying in the bed with no acute distress. Dysarthric EYES: Pupils equal, round, reactive to light and accommodation. No scleral icterus. Extraocular muscles intact.  HEENT: Head atraumatic, normocephalic. Oropharynx and nasopharynx clear.  NECK:  Supple, no jugular venous distention. No thyroid enlargement, no tenderness.  LUNGS: Normal breath sounds bilaterally, no wheezing, rales,rhonchi or crepitation. No use of accessory muscles of respiration.  CARDIOVASCULAR: S1, S2 normal. No murmurs, rubs, or gallops.  ABDOMEN: Soft, mild tenderness noted in the right lower quadrant but no rebound or guarding was noted. No significant tenderness around colostomy site or in left lower quadrant, nondistended. Stool is in colostomy bag . Bowel sounds present. No organomegaly or mass. not any significant  skin abnormalities around the colostomy site.  EXTREMITIES: No pedal edema, cyanosis, or clubbing. Right transmetatarsal amputaion present. NEUROLOGIC: Cranial nerves II through XII are intact. Muscle strength 4/5 in all extremities. Sensation intact. Gait not checked.  PSYCHIATRIC: The patient is alert and oriented x 3.  SKIN: No obvious rash, lesion, or ulcer.   ORDERS/RESULTS REVIEWED:   CBC  Recent Labs Lab 07/15/15 2014 07/16/15 0815 07/17/15 0106 07/18/15 0328 07/19/15 0523  WBC 23.5* 22.7* 22.6* 25.2* 25.0*  HGB 13.4 12.7 12.8 12.6 13.0  HCT 43.8 40.8 40.8 40.6 40.5  PLT 889* 854* 762* 804* 796*  MCV 79.5* 78.8* 81.0 80.3 79.4*  MCH 24.3* 24.5* 25.4* 24.9* 25.5*  MCHC 30.6* 31.1* 31.3* 31.0* 32.1  RDW 26.9* 26.6* 25.9* 25.8* 26.2*  LYMPHSABS 1.9  --   --   --   --   MONOABS 0.7  --   --   --   --   EOSABS 0.3  --   --   --   --   BASOSABS 0.1  --   --   --   --    ------------------------------------------------------------------------------------------------------------------  Chemistries   Recent Labs Lab 07/15/15 2014 07/16/15 0815 07/17/15 0514 07/18/15 0328  NA 139 134* 129*  --   K 4.1 3.9  --   --   CL 104 103  --   --   CO2 28 26  --   --   GLUCOSE 114* 95  --   --   BUN 9 7  --   --   CREATININE 0.73 0.57  --  0.56  CALCIUM 8.8* 8.3*  --   --   AST  24  --   --   --   ALT 10*  --   --   --   ALKPHOS 112  --   --   --   BILITOT 0.4  --   --   --    ------------------------------------------------------------------------------------------------------------------ estimated creatinine clearance is 66 mL/min (by C-G formula based on Cr of 0.56). ------------------------------------------------------------------------------------------------------------------ No results for input(s): TSH, T4TOTAL, T3FREE, THYROIDAB in the last 72 hours.  Invalid input(s): FREET3  Cardiac Enzymes  Recent Labs Lab 07/16/15 1541 07/16/15 2318 07/17/15 0514   TROPONINI <0.03 <0.03 0.04*   ------------------------------------------------------------------------------------------------------------------ Invalid input(s): POCBNP ---------------------------------------------------------------------------------------------------------------  RADIOLOGY: No results found.   ASSESSMENT AND PLAN:  1. RLQ Abdominal pain likely due to acute cystitis versus venous pooling due to IVC filter clot ,  no obvious cellulitis was noted, appreciate vascular surgeon as well as general surgeon input . No obvious infection. Discontinue antibiotic. White blood cell count is still high- likely due to blood clot. 2. 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 On bridging therapy now. INR is still low. 3. Hyponatremia. follow in the morning 4. Leukocytosis. Follow with therapy, likely high due to clot. 5. Thrombocytosis possibly reactive, stable  6. Acute cystitis without hematuria.  Following cultures. Discontinue IV antibiotics, start patient on levofloxacin orally- finished 5 days. 7. Questionable pneumonia. Negative blood cultures 2 days. Sputum cultures is not sent. Finished 5 days of levaquin.  Management plans discussed with the patient. As per Education officer, museum- she may need more help at home. Finished her rehab days.  DRUG ALLERGIES: No Known Allergies  CODE STATUS:     Code Status Orders        Start     Ordered   07/16/15 0152  Full code   Continuous     07/16/15 0151      TOTAL TIME TAKING CARE OF THIS PATIENT: 35 minutes.    Vaughan Basta M.D on 07/19/2015 at 8:51 PM  Between 7am to 6pm - Pager - [phone]  After 6pm go to [email or web address] - password [email or web address] [facility]  Office  [phone]  CC: Primary care physician; Renata Caprice, DO

## 2015-07-19 NOTE — Plan of Care (Signed)
Problem: Discharge Progression Outcomes Goal: Activity appropriate for discharge plan Outcome: Progressing Progress toward goals: Discharge plan discussed with pt thatshe will be going home with private care, family, and home health. Activity: Pt worked with PT today. Diet: Tolerating diet well.

## 2015-07-20 LAB — PROTIME-INR
INR: 1.46
PROTHROMBIN TIME: 17.9 s — AB (ref 11.4–15.0)

## 2015-07-20 LAB — HEPARIN LEVEL (UNFRACTIONATED): HEPARIN UNFRACTIONATED: 0.42 [IU]/mL (ref 0.30–0.70)

## 2015-07-20 LAB — CBC
HCT: 42.4 % (ref 35.0–47.0)
Hemoglobin: 13.1 g/dL (ref 12.0–16.0)
MCH: 24.5 pg — ABNORMAL LOW (ref 26.0–34.0)
MCHC: 31 g/dL — ABNORMAL LOW (ref 32.0–36.0)
MCV: 79.1 fL — ABNORMAL LOW (ref 80.0–100.0)
Platelets: 805 10*3/uL — ABNORMAL HIGH (ref 150–440)
RBC: 5.36 MIL/uL — AB (ref 3.80–5.20)
RDW: 26.1 % — ABNORMAL HIGH (ref 11.5–14.5)
WBC: 23.8 10*3/uL — AB (ref 3.6–11.0)

## 2015-07-20 LAB — BASIC METABOLIC PANEL
Anion gap: 6 (ref 5–15)
BUN: 8 mg/dL (ref 6–20)
CHLORIDE: 105 mmol/L (ref 101–111)
CO2: 28 mmol/L (ref 22–32)
CREATININE: 0.37 mg/dL — AB (ref 0.44–1.00)
Calcium: 8.4 mg/dL — ABNORMAL LOW (ref 8.9–10.3)
GFR calc Af Amer: >60 mL/min (ref >60)
Glucose, Bld: 102 mg/dL — ABNORMAL HIGH (ref 65–99)
Potassium: 2.9 mmol/L — CL (ref 3.5–5.1)
SODIUM: 139 mmol/L (ref 135–145)

## 2015-07-20 LAB — CULTURE, BLOOD (ROUTINE X 2)
CULTURE: NO GROWTH
Culture: NO GROWTH

## 2015-07-20 LAB — MAGNESIUM: Magnesium: 1.5 mg/dL — ABNORMAL LOW (ref 1.7–2.4)

## 2015-07-20 LAB — POTASSIUM: POTASSIUM: 3.9 mmol/L (ref 3.5–5.1)

## 2015-07-20 MED ORDER — WARFARIN SODIUM 5 MG PO TABS: 5.0000 mg | ORAL_TABLET | Freq: Every day | ORAL | Status: DC

## 2015-07-20 MED ORDER — ENOXAPARIN SODIUM 100 MG/ML ~~LOC~~ SOLN
1.0000 mg/kg | Freq: Two times a day (BID) | SUBCUTANEOUS | Status: DC
  Administered 2015-07-20: 10:00:00 95 mg via SUBCUTANEOUS
  Filled 2015-07-20: qty 1

## 2015-07-20 MED ORDER — POTASSIUM CHLORIDE 10 MEQ/100ML IV SOLN
10.0000 meq | INTRAVENOUS | Status: AC
  Administered 2015-07-20: 10 meq via INTRAVENOUS
  Filled 2015-07-20 (×2): qty 100

## 2015-07-20 MED ORDER — ENOXAPARIN SODIUM 150 MG/ML ~~LOC~~ SOLN: 1.0000 mg/kg | Freq: Two times a day (BID) | SUBCUTANEOUS | Status: DC

## 2015-07-20 MED ORDER — MAGNESIUM SULFATE 2 GM/50ML IV SOLN
2.0000 g | Freq: Once | INTRAVENOUS | Status: AC
  Administered 2015-07-20: 2 g via INTRAVENOUS
  Filled 2015-07-20: qty 50

## 2015-07-20 MED ORDER — POTASSIUM CHLORIDE CRYS ER 20 MEQ PO TBCR
40.0000 meq | EXTENDED_RELEASE_TABLET | Freq: Two times a day (BID) | ORAL | Status: DC
  Administered 2015-07-20: 40 meq via ORAL
  Filled 2015-07-20: qty 2

## 2015-07-20 MED ORDER — OXYCODONE-ACETAMINOPHEN 2.5-325 MG PO TABS: 1.0000 | ORAL_TABLET | ORAL | Status: DC | PRN

## 2015-07-20 MED ORDER — POTASSIUM CHLORIDE IN NACL 40-0.9 MEQ/L-% IV SOLN
INTRAVENOUS | Status: AC
  Administered 2015-07-20: 09:00:00 100 mL/h via INTRAVENOUS
  Filled 2015-07-20: qty 1000

## 2015-07-20 MED ORDER — POTASSIUM CHLORIDE CRYS ER 20 MEQ PO TBCR: 20.0000 meq | EXTENDED_RELEASE_TABLET | Freq: Two times a day (BID) | ORAL | Status: DC

## 2015-07-20 NOTE — Care Management Important Message (Signed)
Important Message  Patient Details  Name: Yolanda Wells MRN: [id number] Date of Birth: 08-Nov-1932   Medicare Important Message Given:  Yes-third notification given    Shelbie Ammons, RN 07/20/2015, 11:38 AM

## 2015-07-20 NOTE — Plan of Care (Signed)
Problem: Discharge Progression Outcomes Goal: Other Discharge Outcomes/Goals Outcome: Progressing Plan of care progress to goal: VSS Pt tolerating regular diet Pt remains in bed. Occasional bouts of dyspnea resolved with repositioning in bed.  O2 sats in the upper 90's.

## 2015-07-20 NOTE — Progress Notes (Signed)
ANTICOAGULATION CONSULT NOTE -Follow up [facility] for heparin Indication: DVT  No Known Allergies  Patient Measurements: Height: 5\' 7"  (170.2 cm) Weight: 213 lb 14.4 oz (97.024 kg) IBW/kg (Calculated) : 61.6 Heparin Dosing Weight: 83.5 kg  Vital Signs: Temp: 98.1 F (36.7 C) (09/09 0459) Temp Source: Oral (09/09 0459) BP: 145/76 mmHg (09/09 0459) Pulse Rate: 92 (09/09 0459)  Labs:  Recent Labs  07/18/15 0328  07/18/15 1436 07/18/15 2058 07/19/15 0523 07/20/15 0523  HGB 12.6  --   --   --  13.0 13.1  HCT 40.6  --   --   --  40.5 42.4  PLT 804*  --   --   --  796* 805*  LABPROT  --   --  18.1*  --  17.3* 17.9*  INR  --   --  1.48  --  1.39 1.46  HEPARINUNFRC 0.63  < >  --  0.56 0.44 0.42  CREATININE 0.56  --   --   --   --  0.37*  < > = values in this interval not displayed.  Estimated Creatinine Clearance: 66 mL/min (by C-G formula based on Cr of 0.37).   Medical History: Past Medical History  Diagnosis Date  . Hypertension   . Deep venous thrombosis     right lower extremity  . History of hysterectomy   . History of bilateral hip replacements   . Polycythemia vera   . Dependent edema   . Arthritis     Medications:  Infusions:  . heparin 1,400 Units/hr (07/19/15 1550)    Assessment: 83 yof cc abdominal pain with radiologic evidence of blood clot in inferior vena cava above her IVC filter.   Goal of Therapy:  Heparin level 0.3-0.7 units/ml Monitor platelets by anticoagulation protocol: Yes   Plan:  Heparin level is above goal so will decrease infusion to 1250 units/hr and recheck a HL in 8 hours.   9/5: Heparin level at 1655=0.60. Will recheck level in 8 hrs for confirmation of therapeutic level.   9/6 0100 heparin level 0.42. Recheck with CBC in AM.  9/6: HL @ 17:00 = 0.18 Will order Heparin 2500 units IV X 1 bolus and increase drip rate to 1500 units/hr. Will draw next HL 8 hrs after rate change on 9/7 @ 3:00.  9/7 03:00  anti-Xa 0.63. Recheck in 8 hours to confirm.    9/7 :   HL @ 21:00 = 0.56 Will continue this pt on current rate of 1400 units/hr and recheck HL on 9/8 with AM labs.   9/8 AM anti-Xa 0.44. Continue current regimen and check CBC and anti-Xa tomorrow AM.  9/9 AM anti-Xa 0.42. Contnue current regimen and check CBC and anti-Xa in AM.  Julieanna Geraci S, Pharm.D.  Clinical Pharmacist 07/20/2015,6:20 AM

## 2015-07-20 NOTE — Progress Notes (Signed)
ANTICOAGULATION CONSULT NOTE - Initial Consult  Pharmacy Consult for Enoxaparin Indication: DVT  No Known Allergies  Patient Measurements: Height: 5\' 7"  (170.2 cm) Weight: 213 lb 14.4 oz (97.024 kg) IBW/kg (Calculated) : 61.6 Heparin Dosing Weight:   Vital Signs: Temp: 98 F (36.7 C) (09/09 0800) Temp Source: Oral (09/09 0800) BP: 145/74 mmHg (09/09 0800) Pulse Rate: 92 (09/09 0800)  Labs:  Recent Labs  07/18/15 0328  07/18/15 1436 07/18/15 2058 07/19/15 0523 07/20/15 0523  HGB 12.6  --   --   --  13.0 13.1  HCT 40.6  --   --   --  40.5 42.4  PLT 804*  --   --   --  796* 805*  LABPROT  --   --  18.1*  --  17.3* 17.9*  INR  --   --  1.48  --  1.39 1.46  HEPARINUNFRC 0.63  < >  --  0.56 0.44 0.42  CREATININE 0.56  --   --   --   --  0.37*  < > = values in this interval not displayed.  Estimated Creatinine Clearance: 66 mL/min (by C-G formula based on Cr of 0.37).   Medical History: Past Medical History  Diagnosis Date  . Hypertension   . Deep venous thrombosis     right lower extremity  . History of hysterectomy   . History of bilateral hip replacements   . Polycythemia vera   . Dependent edema   . Arthritis     Medications:  Scheduled:  . enoxaparin (LOVENOX) injection  1 mg/kg Subcutaneous Q12H  . furosemide  20 mg Oral Daily  . magnesium sulfate 1 - 4 g bolus IVPB  2 g Intravenous Once  . potassium chloride  40 mEq Oral BID  . sodium chloride  3 mL Intravenous Q12H  . warfarin  5 mg Oral q1800  . Warfarin - Pharmacist Dosing Inpatient   Does not apply q1800    Assessment: 83 yof cc abdominal pain with radiologic evidence of blood clot in inferior vena cava above her IVC filter.  Patient currently on warfarin therapy. Previously on heparin gtt but discontinued and started on enoxaparin treatment dose. Pharmacy consulted to dose and monitor enoxaparin      Plan:  Will start lovenox 95 mg SQ q12 hours. Pharmacy to follow.   Larene Beach,  PharmD

## 2015-07-20 NOTE — Plan of Care (Signed)
Problem: Discharge Progression Outcomes Goal: Other Discharge Outcomes/Goals Outcome: Adequate for Discharge Pt will be going home on lovenox 2 times a day . gdtr instructed in giving injection. Iv sites d/cd  X2.  Ready for discharge  Home. Waiting  To have bed delivered to home. gdtr to call when bed is delivered and pt to  Be transported home  By ems.

## 2015-07-20 NOTE — Progress Notes (Signed)
ANTICOAGULATION CONSULT NOTE - Initial Consult  Pharmacy Consult for warfarin Indication: DVT  No Known Allergies  Patient Measurements: Height: 5\' 7"  (170.2 cm) Weight: 213 lb 14.4 oz (97.024 kg) IBW/kg (Calculated) : 61.6   Vital Signs: Temp: 98.1 F (36.7 C) (09/09 0459) Temp Source: Oral (09/09 0459) BP: 145/76 mmHg (09/09 0459) Pulse Rate: 92 (09/09 0459)  Labs:  Recent Labs  07/18/15 0328  07/18/15 1436 07/18/15 2058 07/19/15 0523 07/20/15 0523  HGB 12.6  --   --   --  13.0 13.1  HCT 40.6  --   --   --  40.5 42.4  PLT 804*  --   --   --  796* 805*  LABPROT  --   --  18.1*  --  17.3* 17.9*  INR  --   --  1.48  --  1.39 1.46  HEPARINUNFRC 0.63  < >  --  0.56 0.44 0.42  CREATININE 0.56  --   --   --   --  0.37*  < > = values in this interval not displayed.  Estimated Creatinine Clearance: 66 mL/min (by C-G formula based on Cr of 0.37).   Medical History: Past Medical History  Diagnosis Date  . Hypertension   . Deep venous thrombosis     right lower extremity  . History of hysterectomy   . History of bilateral hip replacements   . Polycythemia vera   . Dependent edema   . Arthritis      Assessment: Yolanda Wells is a 81yo female being treated for inferior vena cava clot. Patient was previously on rivaroxaban 20mg  daily, but failed therapy. MD wants to initiate warfarin for treatment, pharmacy consulted. Patient is currently on heparin drip, 1400units/hr, and almost at goal level of 0.3-0.7. Patient is currently on lexofloxacin, platelets are 804, Hgb 12.6 and Hct 40.6.    Goal of Therapy:  INR 2-3 Monitor platelets by anticoagulation protocol: Yes   Plan:  Will start patient on 5mg  warfarin at 1800 today. PT-INR ordered now for baseline level and for 0500 tomorrow. Pharmacy will continue to monitor and adjust dosing as needed.     9/8 INR 1.39. Continue current regimen and f/u INR in AM.  Sim Boast, PharmD, BCPS  07/20/2015    Addendum: MD entered  order for warfarin 7.5 mg PO x1 for today. Discussed with MD; likely have not seen full effects of yesterday's dose in this elderly pt with low albumin. Pt also on levofloxacin.  Will order warfarin 5 mg PO for tonight Hgb and plt count have been stable.   Rayna Sexton, PharmD, BCPS Clinical Pharmacist 07/20/2015 6:18 AM   9/9 INR 1.46 Continue current regimen and f/u INR in AM. Consider increasing dose tomorrow if no change in INR.  Sim Boast, PharmD, BCPS  07/20/2015

## 2015-07-20 NOTE — Progress Notes (Signed)
Patient is going home today and requires EMS for transport home. Clinical Education officer, museum (CSW) confirmed address with patient's granddaughter [name]. RN is aware of above. Please reconsult if future social work needs arise. CSW signing off.   Blima Rich, [place] [phone]

## 2015-07-20 NOTE — Care Management (Signed)
Discharge to home today per Dr. Anselm Jungling. Hospital bed will be delivered today. Will be followed by [facility] for nursing and therapy. Granddaughter [name] will be taught how to administer Heparin  twice a day. Granddaughter will be arranging caregivers in the home. Ms. Dipierro will be going back to [facility]: [address]. Shelbie Ammons RN MSN Care Management [phone]

## 2015-07-20 NOTE — Discharge Summary (Signed)
[name] [place] NAME: [name]    MR#:  [id number]  DATE OF BIRTH:  January 17, 1932  DATE OF ADMISSION:  07/15/2015 ADMITTING PHYSICIAN: Lance Coon, MD  DATE OF DISCHARGE: 07/20/2015  PRIMARY CARE PHYSICIAN: Renata Caprice, DO    ADMISSION DIAGNOSIS:  Pulmonary infiltrate [R91.8]  DISCHARGE DIAGNOSIS:  Principal Problem:   Abdominal wall cellulitis Active Problems:   DVT (deep venous thrombosis)   Polycythemia vera   Dependent edema   Arthritis   UTI (lower urinary tract infection)   NEW DVT diagnosed- failure of xarelto.  SECONDARY DIAGNOSIS:   Past Medical History  Diagnosis Date  . Hypertension   . Deep venous thrombosis     right lower extremity  . History of hysterectomy   . History of bilateral hip replacements   . Polycythemia vera   . Dependent edema   . Arthritis     HOSPITAL COURSE:   1. RLQ Abdominal pain likely due to acute cystitis versus venous pooling due to IVC filter clot , no obvious cellulitis was noted, appreciate vascular surgeon as well as general surgeon input . No obvious infection. Discontinue antibiotic. White blood cell count is still high- likely due to blood clot. 2. 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 On bridging therapy now. INR is still low. Will swich to lovenox 100 mg Between inj BID and continue that with coumadin for 4 days- on d/c.  visiting nurse to check her INR for follow up. 3. Hyponatremia. follow in the morning- improved. 4. Leukocytosis. Follow with therapy, likely high due to clot. Came down a little. 5. Thrombocytosis possibly reactive, stable  6. Acute cystitis without hematuria. Following cultures. Discontinue IV antibiotics, start patient on levofloxacin orally- finished 5 days. 7. Questionable pneumonia.  Negative blood cultures 2 days. Sputum cultures is not sent. Finished 5 days of levaquin. 8. Hypokalemia- replacing orally- given Mg as it was low.  DISCHARGE CONDITIONS:   Stable.  CONSULTS OBTAINED:  Treatment Team:  Katha Cabal, MD Vaughan Basta, MD  DRUG ALLERGIES:  No Known Allergies  DISCHARGE MEDICATIONS:   Current Discharge Medication List    START taking these medications   Details  enoxaparin (LOVENOX) 150 MG/ML injection Inject 0.65 mLs (100 mg total) into the skin every 12 (twelve) hours. Qty: 8 Syringe, Refills: 0    warfarin (COUMADIN) 5 MG tablet Take 1 tablet (5 mg total) by mouth daily at 6 PM. Qty: 20 tablet, Refills: 0      CONTINUE these medications which have CHANGED   Details  oxycodone-acetaminophen (PERCOCET) 2.5-325 MG per tablet Take 1 tablet by mouth every 4 (four) hours as needed for pain. Qty: 30 tablet, Refills: 0    potassium chloride SA (K-DUR,KLOR-CON) 20 MEQ tablet Take 1 tablet (20 mEq total) by mouth 2 (two) times daily. Qty: 30 tablet, Refills: 0      CONTINUE these medications which have NOT CHANGED   Details  docusate sodium (COLACE) 100 MG capsule Take 100 mg by mouth 2 (two) times daily.    ferrous sulfate 325 (65 FE) MG tablet Take 325 mg by mouth 2 (two) times daily with a meal.    folic acid (FOLVITE) 315 MCG tablet Take 400 mcg by mouth daily.    furosemide (LASIX) 20 MG tablet Take 20 mg by mouth  daily.    ondansetron (ZOFRAN) 4 MG tablet Take 4 mg by mouth every 6 (six) hours as needed for nausea or vomiting.    simethicone (MYLICON) 80 MG chewable tablet Chew 30 mg by mouth 4 (four) times daily -  before meals and at bedtime.      STOP taking these medications     rivaroxaban (XARELTO) 20 MG TABS tablet          DISCHARGE INSTRUCTIONS:    Follow INR.  If you experience worsening of your admission symptoms, develop shortness of breath, life threatening emergency, suicidal or homicidal  thoughts you must seek medical attention immediately by calling 911 or calling your MD immediately  if symptoms less severe.  You Must read complete instructions/literature along with all the possible adverse reactions/side effects for all the Medicines you take and that have been prescribed to you. Take any new Medicines after you have completely understood and accept all the possible adverse reactions/side effects.   Please note  You were cared for by a hospitalist during your hospital stay. If you have any questions about your discharge medications or the care you received while you were in the hospital after you are discharged, you can call the unit and asked to speak with the hospitalist on call if the hospitalist that took care of you is not available. Once you are discharged, your primary care physician will handle any further medical issues. Please note that NO REFILLS for any discharge medications will be authorized once you are discharged, as it is imperative that you return to your primary care physician (or establish a relationship with a primary care physician if you do not have one) for your aftercare needs so that they can reassess your need for medications and monitor your lab values.    Today   CHIEF COMPLAINT:   Chief Complaint  Patient presents with  . Abdominal Pain    HISTORY OF PRESENT ILLNESS:  Yolanda Wells  is a 81 y.o. female who presents with increasing abdominal pain around the site of her ostomy, and dysuria. Patient states that she's been having abdominal pain since she had colostomy, but that is gotten worse over the last few days and she now has some erythema of her skin around the ostomy site. She denies overt fevers or chills at home. She has been having some dysuria for the past few days as well. On evaluation in the ED she was found to have an elevated white blood cell count, and on CT abdomen and pelvis was found to have some filling defect concerning for  clot/DVT formation around her IVC filter. She also has some lower lobe findings in her lungs on the same CT scan concerning for possible pneumonia versus some infarcted lung. Patient has not had any cough, hemoptysis or respiratory symptoms. Hospitalists were called for admission for abdominal wall cellulitis, UTI, and further investigate this question of clot around her IVC filter and a question of possible small PE versus pneumonia.    VITAL SIGNS:  Blood pressure 145/74, pulse 92, temperature 98 F (36.7 C), temperature source Oral, resp. rate 16, height 5\' 7"  (1.702 m), weight 97.024 kg (213 lb 14.4 oz), SpO2 97 %.  I/O:   Intake/Output Summary (Last 24 hours) at 07/20/15 1020 Last data filed at 07/20/15 1000  Gross per 24 hour  Intake    720 ml  Output    150 ml  Net    570 ml    PHYSICAL  EXAMINATION:   GENERAL: 81 y.o.-year-old patient lying in the bed with no acute distress. Dysarthric EYES: Pupils equal, round, reactive to light and accommodation. No scleral icterus. Extraocular muscles intact.  HEENT: Head atraumatic, normocephalic. Oropharynx and nasopharynx clear.  NECK: Supple, no jugular venous distention. No thyroid enlargement, no tenderness.  LUNGS: Normal breath sounds bilaterally, no wheezing, rales,rhonchi or crepitation. No use of accessory muscles of respiration.  CARDIOVASCULAR: S1, S2 normal. No murmurs, rubs, or gallops.  ABDOMEN: Soft, mild tenderness noted in the right lower quadrant but no rebound or guarding was noted. No significant tenderness around colostomy site or in left lower quadrant, nondistended. Stool is in colostomy bag . Bowel sounds present. No organomegaly or mass. not any significant skin abnormalities around the colostomy site.  EXTREMITIES: No pedal edema, cyanosis, or clubbing. Right transmetatarsal amputaion present. NEUROLOGIC: Cranial nerves II through XII are intact. Muscle strength 4/5 in all extremities. Sensation intact. Gait  not checked.  PSYCHIATRIC: The patient is alert and oriented x 3.  SKIN: No obvious rash, lesion, or ulcer.    DATA REVIEW:   CBC  Recent Labs Lab 07/20/15 0523  WBC 23.8*  HGB 13.1  HCT 42.4  PLT 805*    Chemistries   Recent Labs Lab 07/15/15 2014  07/20/15 0523  NA 139  < > 139  K 4.1  < > 2.9*  CL 104  < > 105  CO2 28  < > 28  GLUCOSE 114*  < > 102*  BUN 9  < > 8  CREATININE 0.73  < > 0.37*  CALCIUM 8.8*  < > 8.4*  MG  --   --  1.5*  AST 24  --   --   ALT 10*  --   --   ALKPHOS 112  --   --   BILITOT 0.4  --   --   < > = values in this interval not displayed.  Cardiac Enzymes  Recent Labs Lab 07/17/15 0514  TROPONINI 0.04*    Microbiology Results  Results for orders placed or performed during the hospital encounter of 07/15/15  Culture, blood (routine x 2)     Status: None (Preliminary result)   Collection Time: 07/15/15  8:50 PM  Result Value Ref Range Status   Specimen Description BLOOD LEFT ASSIST CONTROL  Final   Special Requests BOTTLES DRAWN AEROBIC AND ANAEROBIC 3CC  Final   Culture NO GROWTH 4 DAYS  Final   Report Status PENDING  Incomplete  Culture, blood (routine x 2)     Status: None (Preliminary result)   Collection Time: 07/15/15 10:50 PM  Result Value Ref Range Status   Specimen Description BLOOD LEFT HAND  Final   Special Requests BOTTLES DRAWN AEROBIC AND ANAEROBIC 4CC  Final   Culture NO GROWTH 4 DAYS  Final   Report Status PENDING  Incomplete    RADIOLOGY:  No results found.    Management plans discussed with the patient, family and they are in agreement.  CODE STATUS:     Code Status Orders        Start     Ordered   07/16/15 0152  Full code   Continuous     07/16/15 0151      TOTAL TIME TAKING CARE OF THIS PATIENT: 35 minutes.    Vaughan Basta M.D on 07/20/2015 at 10:20 AM  Between 7am to 6pm - Pager - [phone]  After 6pm go to [email or web address] - [place]  [facility]  Office  [phone]  CC: Primary care physician; Renata Caprice, DO

## 2015-07-20 NOTE — Discharge Instructions (Signed)
Visiting nurse need to check INR level in 3 days and communicate result with PMD to adjust coumadin dose.

## 2015-07-21 NOTE — Care Management Note (Signed)
Case Management Note  Patient Details  Name: Yolanda Wells MRN: [id number] Date of Birth: October 08, 1932  Subjective/Objective:    [name]'s daughter  Madolyn Frieze [phone] called to report that they had no ostomy supplies for [name] at home. [name] has a colostony by Dr Marina Gravel on 04-04-15 and has been in SNF until she was admitted to [facility] on 07/15/15 with abdominal pain. [name] was discharged home from [facility] on 07/20/15 with a referral for home health RN by [place].This Probation officer called [name] and explained that [place] was trying to locate some ostomy supplies and Letta Kocher RN at [facility] [phone] was given [name]'s phone number to call with ostomy information update.  This Probation officer also called [name], [address] supervisor to ask if [facility] could provide a few spare colostomy supplies, and [name] stated that we would need the size of equipment needed before we could dispense ostomy supplies to [name]. Daughter Madolyn Frieze stated that she did not know the sizes of [name] ostomy bag and waffer.              Action/Plan:   Expected Discharge Date:                  Expected Discharge Plan:  [place]  In-House Referral:     Discharge planning Services     Post Acute Care Choice:    Choice offered to:  Patient, [facility] POA / Guardian  DME Arranged:  Hospital bed DME Agency:  [place]:  RN, PT [facility] Agency:  [place]  Status of Service:     Medicare Important Message Given:  Yes-third notification given Date Medicare IM Given:    Medicare IM give by:    Date Additional Medicare IM Given:    Additional Medicare Important Message give by:     If discussed at [place] of Stay Meetings, dates discussed:    Additional Comments:  Marely Apgar A, RN 07/21/2015, 1:29 PM

## 2015-07-31 ENCOUNTER — Telehealth: Payer: Self-pay

## 2015-07-31 NOTE — Telephone Encounter (Signed)
Is there nothing coming from Ostomy at all? Any liquid? If there is nothing at all coming from the bag then:  Someone will need to go out and check the appliance to make sure the hole around the ostomy itself. Best bet is that we call the homecare company and have them send a nurse (preferably an ostomy nurse). This needs to be done today.  If she cannot come in and no one can go out to check this, she needs to be sent to Emergency Department if she has nothing in the bag by tomorrow.

## 2015-07-31 NOTE — Telephone Encounter (Signed)
Yolanda Wells, patient's granddaughter called stating that her grandmother use to take 2 tablets Colace per day. However, because her stools were too soft and watery, Yolanda Wells started giving her 1 tablet a day since Saturday. Patient was doing well with just 1 tab per day but the home care nurse went to see them and told Yolanda Wells that she was cutting her grandmother's ostomy bag to deep and big. Yolanda Wells stated that since the nurse put her ostomy bag, her grandmother has been stooped up. She is not sure if it's because the whole is too small now or if there is another problem going on at this time. I asked if there was a possibility to bring her grandmother in to be seen and she stated that she wasn't mobile at this time. I told her that I would ask what else we could do for her. Yolanda Wells understood. Amber, do you have any suggestions?  Yolanda Wells's # [phone] Surgery: Colectomy with Colostomy Dr. Marina Gravel 04/03/2015

## 2015-08-01 ENCOUNTER — Telehealth: Payer: Self-pay

## 2015-08-01 NOTE — Telephone Encounter (Signed)
Called [name] back and asked how her grandmother was doing. She stated that she was doing better. She stated that yesterday she started giving her grandmother 2 stool softeners and she was able to pass some stool. [name] stated that today her grandmother was doing better and that she was going to continue giving her 2 stool softeners per day since that helps her pass stools.  [name] also stated that she wanted an order to be faxed to [place] for an ostomy nurse to come over to their house. I told her that I would do that today.

## 2015-08-01 NOTE — Telephone Encounter (Signed)
Yolanda Wells from [place] called in regarding an order that was received from Dr. Marina Gravel today. She wanted [place] to be aware that all of their nurses were credentialed and had special certification to see patients with ostomies. I explained the situation that is referenced in yesterday's note regarding loss of bowel function after colostomy wafer was changed and expressed that we would still like patient to be seen by specialized ostomy nurse at [facility] once to ensure that proper supplies are being placed on ostomy and that changes are going as well as could be; then this visit can be adjusted if needed.  She verbalizes this understanding and will contact wound/ostomy nurse to schedule a visit with the patient.

## 2015-09-03 ENCOUNTER — Emergency Department: Payer: Medicare Other

## 2015-09-03 ENCOUNTER — Emergency Department
Admission: EM | Admit: 2015-09-03 | Discharge: 2015-09-03 | Disposition: A | Discharge status: Home / Self Care | Payer: Medicare Other | Attending: Emergency Medicine | Admitting: Emergency Medicine

## 2015-09-03 DIAGNOSIS — I10 Essential (primary) hypertension: Secondary | ICD-10-CM | POA: Insufficient documentation

## 2015-09-03 DIAGNOSIS — Z86718 Personal history of other venous thrombosis and embolism: Secondary | ICD-10-CM | POA: Insufficient documentation

## 2015-09-03 DIAGNOSIS — Z79899 Other long term (current) drug therapy: Secondary | ICD-10-CM | POA: Insufficient documentation

## 2015-09-03 DIAGNOSIS — Z86711 Personal history of pulmonary embolism: Secondary | ICD-10-CM | POA: Diagnosis not present

## 2015-09-03 DIAGNOSIS — R0602 Shortness of breath: Secondary | ICD-10-CM | POA: Diagnosis present

## 2015-09-03 DIAGNOSIS — R0600 Dyspnea, unspecified: Secondary | ICD-10-CM | POA: Insufficient documentation

## 2015-09-03 HISTORY — DX: Systemic involvement of connective tissue, unspecified: M35.9

## 2015-09-03 LAB — CBC WITH DIFFERENTIAL/PLATELET
BASOS ABS: 0.3 10*3/uL — AB (ref 0–0.1)
Basophils Relative: 1 %
EOS ABS: 0.8 10*3/uL — AB (ref 0–0.7)
Eosinophils Relative: 3 %
HCT: 53.2 % — ABNORMAL HIGH (ref 35.0–47.0)
Hemoglobin: 16.5 g/dL — ABNORMAL HIGH (ref 12.0–16.0)
LYMPHS ABS: 1.3 10*3/uL (ref 1.0–3.6)
Lymphocytes Relative: 5 %
MCH: 25 pg — ABNORMAL LOW (ref 26.0–34.0)
MCHC: 31 g/dL — ABNORMAL LOW (ref 32.0–36.0)
MCV: 80.7 fL (ref 80.0–100.0)
MONO ABS: 0.5 10*3/uL (ref 0.2–0.9)
MONOS PCT: 2 %
Neutro Abs: 22.8 10*3/uL — ABNORMAL HIGH (ref 1.4–6.5)
Neutrophils Relative %: 89 %
PLATELETS: 1018 10*3/uL — AB (ref 150–440)
RBC: 6.59 MIL/uL — AB (ref 3.80–5.20)
RDW: 22.5 % — ABNORMAL HIGH (ref 11.5–14.5)
WBC: 25.7 10*3/uL — AB (ref 3.6–11.0)

## 2015-09-03 LAB — BASIC METABOLIC PANEL
ANION GAP: 6 (ref 5–15)
BUN: 9 mg/dL (ref 6–20)
CO2: 22 mmol/L (ref 22–32)
Calcium: 8.7 mg/dL — ABNORMAL LOW (ref 8.9–10.3)
Chloride: 109 mmol/L (ref 101–111)
Creatinine, Ser: 0.54 mg/dL (ref 0.44–1.00)
GFR calc Af Amer: >60 mL/min (ref >60)
GLUCOSE: 95 mg/dL (ref 65–99)
POTASSIUM: 4.5 mmol/L (ref 3.5–5.1)
Sodium: 137 mmol/L (ref 135–145)

## 2015-09-03 LAB — PROTIME-INR
INR: 2.63
Prothrombin Time: 28.2 seconds — ABNORMAL HIGH (ref 11.4–15.0)

## 2015-09-03 LAB — BRAIN NATRIURETIC PEPTIDE: B NATRIURETIC PEPTIDE 5: 115 pg/mL — AB (ref 0.0–100.0)

## 2015-09-03 MED ORDER — IOHEXOL 350 MG/ML SOLN
75.0000 mL | Freq: Once | INTRAVENOUS | Status: AC | PRN
  Administered 2015-09-03: 75 mL via INTRAVENOUS

## 2015-09-03 NOTE — ED Provider Notes (Signed)
Time Seen: Approximately 1700  I have reviewed the triage notes  Chief Complaint: Shortness of Breath   History of Present Illness: Yolanda Wells is a 81 y.o. female who presents via EMS for shortness of breath. Patient has a known history of previous DVT along with pulmonary embolism. The patient's on chronic Coumadin. Patient denies any fever, chills or productive cough. Her pulse ox remained fine in transport and at the scene and has been 97% here on room air. Patient denies any new chest pain. She states she feels that she would improve if she had outpatient oxygen therapy. The patient herself is somewhat poor*is able to speak to her power of attorney. She states that the patient as far she can tell doesn't have any new complaints at this time. There has not been any new peripheral edema, calf tenderness or swelling. Patient's followed by hematologist for polycythemia vera who is located [facility] and apparently they're trying to arrange a local hematologist since is difficult to transport her [facility]. The patient has a current IVC filter and previous DVT in the right lower extremity    Past Medical History  Diagnosis Date  . Hypertension   . Deep venous thrombosis (HCC)     right lower extremity  . History of hysterectomy   . History of bilateral hip replacements   . Polycythemia vera ([place])   . Dependent edema   . Arthritis   . Collagen vascular disease [facility])     Patient Active Problem List   Diagnosis Date Noted  . UTI (lower urinary tract infection) 07/16/2015  . Abdominal wall cellulitis 07/15/2015  . DVT (deep venous thrombosis) ([place]) 07/15/2015  . HTN (hypertension) 07/15/2015  . Polycythemia vera ([place]) 07/15/2015  . Dependent edema 07/15/2015  . Arthritis 07/15/2015  . Pulmonary emboli ([place]) 05/25/2015  . Diverticulosis of colon with hemorrhage 04/02/2015    Past Surgical History  Procedure Laterality Date  . Toe amputation      right  . Joint replacement      Left and  Right Hip  . Abdominal hysterectomy    . Peripheral vascular catheterization N/A 04/02/2015    Procedure: Visceral Angiography;  Surgeon: Algernon Huxley, MD;  Location: [place] [facility];  Service: Cardiovascular;  Laterality: N/A;  . Peripheral vascular catheterization N/A 04/02/2015    Procedure: Visceral Artery Intervention;  Surgeon: Algernon Huxley, MD;  Location: [place] [facility];  Service: Cardiovascular;  Laterality: N/A;  . Colectomy with colostomy creation/hartmann procedure N/A 04/03/2015    Procedure: COLECTOMY WITH COLOSTOMY CREATION/HARTMANN PROCEDURE;  Surgeon: Sherri Rad, MD;  Location: [facility] ORS;  Service: General;  Laterality: N/A;  . Cardiac catheterization Right 04/03/2015    Procedure: CENTRAL LINE INSERTION;  Surgeon: Sherri Rad, MD;  Location: [facility] ORS;  Service: General;  Laterality: Right;  . Peripheral vascular catheterization N/A 05/25/2015    Procedure: IVC Filter Insertion;  Surgeon: Katha Cabal, MD;  Location: [place] [facility];  Service: Cardiovascular;  Laterality: N/A;    Past Surgical History  Procedure Laterality Date  . Toe amputation      right  . Joint replacement      Left and Right Hip  . Abdominal hysterectomy    . Peripheral vascular catheterization N/A 04/02/2015    Procedure: Visceral Angiography;  Surgeon: Algernon Huxley, MD;  Location: [place] [facility];  Service: Cardiovascular;  Laterality: N/A;  . Peripheral vascular catheterization N/A 04/02/2015    Procedure: Visceral Artery Intervention;  Surgeon: Algernon Huxley, MD;  Location: [place] [facility];  Service: Cardiovascular;  Laterality: N/A;  . Colectomy with colostomy creation/hartmann procedure N/A 04/03/2015    Procedure: COLECTOMY WITH COLOSTOMY CREATION/HARTMANN PROCEDURE;  Surgeon: Sherri Rad, MD;  Location: [facility] OR[facility];  Service: General;  Laterality: N/A;  . Cardiac catheterization Right 04/03/2015    Procedure: CENTRAL LINE INSERTION;  Surgeon: Sherri Rad, MD;  Location: [facility] ORS;   Service: General;  Laterality: Right;  . Peripheral vascular catheterization N/A 05/25/2015    Procedure: IVC Filter Insertion;  Surgeon: Katha Cabal, MD;  Location: [place] [facility];  Service: Cardiovascular;  Laterality: N/A;    Current Outpatient Rx  Name  Route  Sig  Dispense  Refill  . docusate sodium (COLACE) 100 MG capsule   Oral   Take 100 mg by mouth 2 (two) times daily.         [name] EXPIRED: enoxaparin (LOVENOX) 150 MG/ML injection   Subcutaneous   Inject 0.65 mLs (100 mg total) into the skin every 12 (twelve) hours.   8 Syringe   0   . ferrous sulfate 325 (65 FE) MG tablet   Oral   Take 325 mg by mouth 2 (two) times daily with a meal.         . folic acid (FOLVITE) 016 MCG tablet   Oral   Take 400 mcg by mouth daily.         . furosemide (LASIX) 20 MG tablet   Oral   Take 20 mg by mouth daily.         . ondansetron (ZOFRAN) 4 MG tablet   Oral   Take 4 mg by mouth every 6 (six) hours as needed for nausea or vomiting.         [name] oxycodone-acetaminophen (PERCOCET) 2.5-325 MG per tablet   Oral   Take 1 tablet by mouth every 4 (four) hours as needed for pain.   30 tablet   0   . potassium chloride SA (K-DUR,KLOR-CON) 20 MEQ tablet   Oral   Take 1 tablet (20 mEq total) by mouth 2 (two) times daily.   30 tablet   0   . simethicone (MYLICON) 80 MG chewable tablet   Oral   Chew 30 mg by mouth 4 (four) times daily -  before meals and at bedtime.         [name] warfarin (COUMADIN) 5 MG tablet   Oral   Take 1 tablet (5 mg total) by mouth daily at 6 PM.   20 tablet   0     Allergies:  Review of patient's allergies indicates no known allergies.  Family History: Family History  Problem Relation Age of Onset  . Heart attack Father   . Hypertension    . Arthritis-Osteo    . COPD Father   . Diabetes Brother   . Osteosarcoma Son     Social History: Social History  Substance Use Topics  . Smoking status: Never Smoker   . Smokeless tobacco:  Never Used  . Alcohol Use: No     Review of Systems:   10 point review of systems was performed and was otherwise negative:  Constitutional: No fever Eyes: No visual disturbances ENT: No sore throat, ear pain Cardiac: No chest pain Respiratory: Shortness of breath without, wheezing, or stridor Abdomen: No abdominal pain, no vomiting, No diarrhea Endocrine: No weight loss, No night sweats Extremities: No peripheral edema, cyanosis Skin: No rashes, easy bruising  Neurologic: No focal weakness, trouble with speech or swollowing Urologic: No dysuria, Hematuria, or urinary frequency   Physical Exam:  ED Triage Vitals  Enc Vitals Group     BP 09/03/15 1612 145/101 mmHg     Pulse Rate 09/03/15 1612 89     Resp 09/03/15 1612 18     Temp 09/03/15 1612 98.9 F (37.2 C)     Temp Source 09/03/15 1612 Oral     SpO2 09/03/15 1612 98 %     Weight --      Height --      Head Cir --      Peak Flow --      Pain Score --      Pain Loc --      Pain Edu? --      Excl. in [place]? --     General: Awake , Alert , and Oriented times 2; somewhat difficult historian. No signs of respiratory distress Head: Normal cephalic , atraumatic Eyes: Pupils equal , round, reactive to light Nose/Throat: No nasal drainage, patent upper airway without erythema or exudate.  Neck: Supple, Full range of motion, No anterior adenopathy or palpable thyroid masses Lungs: Clear to ascultation without wheezes , rhonchi, or rales Heart: Regular rate, regular rhythm without murmurs , gallops , or rubs Abdomen: Soft, non tender without rebound, guarding , or rigidity; bowel sounds positive and symmetric in all 4 quadrants. No organomegaly .        Extremities: 2 plus symmetric pulses. No edema, clubbing or cyanosis Neurologic: normal ambulation, Motor symmetric without deficits, sensory intact Skin: warm, dry, no rashes   Labs:   All laboratory work was reviewed including any pertinent negatives or positives listed  below:  Labs Reviewed  CBC WITH DIFFERENTIAL/PLATELET - Abnormal; Notable for the following:    WBC 25.7 (*)    RBC 6.59 (*)    Hemoglobin 16.5 (*)    HCT 53.2 (*)    MCH 25.0 (*)    MCHC 31.0 (*)    RDW 22.5 (*)    Platelets 1018 (*)    Neutro Abs 22.8 (*)    Eosinophils Absolute 0.8 (*)    Basophils Absolute 0.3 (*)    All other components within normal limits  BASIC METABOLIC PANEL - Abnormal; Notable for the following:    Calcium 8.7 (*)    All other components within normal limits  PROTIME-INR - Abnormal; Notable for the following:    Prothrombin Time 28.2 (*)    All other components within normal limits  BRAIN NATRIURETIC PEPTIDE - Abnormal; Notable for the following:    B Natriuretic Peptide 115.0 (*)    All other components within normal limits   laboratory work abnormalities were reviewed with the patient's power of attorney and seemed to be within brought limits of normal for the patient.  EKG:  ED ECG REPORT I, Daymon Larsen, the attending physician, personally viewed and interpreted this ECG.  Date: 09/03/2015 EKG Time: 1720 Rate: 81 Rhythm: normal sinus rhythm QRS Axis right bundle-branch block Intervals: normal ST/T Wave abnormalities: normal Conduction Disutrbances: none Narrative Interpretation: unremarkable left ventricular hypertrophy No acute ischemic changes are noted  Radiology:  EXAM: CT ANGIOGRAPHY CHEST WITH CONTRAST  TECHNIQUE: Multidetector CT imaging of the chest was performed using the standard protocol during bolus administration of intravenous contrast. Multiplanar CT image reconstructions and MIPs were obtained to evaluate the vascular anatomy.  CONTRAST: 64mL OMNIPAQUE IOHEXOL 350 MG/ML SOLN  COMPARISON: Chest  CT 07/16/2015.  FINDINGS: Mediastinum/Lymph Nodes: No filling defects within the pulmonary arterial tree to suggest underlying pulmonary embolism. Heart size is normal. There is no significant pericardial fluid,  thickening or pericardial calcification. Severe calcifications of the mitral annulus. Dilatation of the pulmonic trunk (4.4 cm in diameter). No pathologically enlarged mediastinal or hilar lymph nodes. Esophagus is unremarkable in appearance. No axillary lymphadenopathy. Enlarged and heterogeneous appearing thyroid gland with partially calcified dominant nodule in the right lobe of the gland measuring 2.7 cm, similar to the prior examination.  Lungs/Pleura: Extensive scarring and chronic volume loss in the lower lobes of the lungs bilaterally, unchanged compared to 07/16/2015. No acute consolidative airspace disease. No pleural effusions. No suspicious appearing pulmonary nodules or masses.  Upper Abdomen: Incompletely visualized low-attenuation lesion in the upper pole of the right kidney is presumably a cyst measuring at least 3.9 cm in diameter.  Musculoskeletal/Soft Tissues: There are no aggressive appearing lytic or blastic lesions noted in the visualized portions of the skeleton.  Review of the MIP images confirms the above findings.  IMPRESSION: 1. No evidence of pulmonary embolism. 2. Dilatation of the pulmonic trunk, similar to the prior study, measuring 4.4 cm in diameter, presumably related to underlying pulmonary arterial hypertension. 3. Additional incidental findings, as above, similar to the prior examination.  I personally reviewed the radiologic studies    ED Course:  Patient's stay here was uneventful and she remained stable on room air pulse oximetry. I felt there was no additional need for medications or hospitalization at this time. Her chest CT does not show any new pulmonary emboli and the patient otherwise does not have any chest pain or any signs of respiratory distress. All parties agree that the patient can be treated on an outpatient basis. I gave her power of attorney copies of all laboratory work to review at [facility] if they don't have any access to our  labs. They've been advised continue with her pursued with hematology evaluation here locally in [place]. All questions and concerns were addressed at the bedside due to the patient's history should be transported back to her residence by BLS.    Assessment:  Acute dyspnea History of pulmonary embolism and deep venous thrombosis  Final Clinical Impression:  Final diagnoses:  Dyspnea     Plan:  Outpatient management Patient was advised to return immediately if condition worsens. Patient was advised to follow up with her primary care physician or other specialized physicians involved and in their current assessment.             Daymon Larsen, MD 09/03/15 2101

## 2015-09-03 NOTE — ED Notes (Signed)
MD at bedside. 

## 2015-09-03 NOTE — Discharge Instructions (Signed)
Shortness of Breath °Shortness of breath means you have trouble breathing. It could also mean that you have a medical problem. You should get immediate medical care for shortness of breath. °CAUSES  °· Not enough oxygen in the air such as with high altitudes or a smoke-filled room. °· Certain lung diseases, infections, or problems. °· Heart disease or conditions, such as angina or heart failure. °· Low red blood cells (anemia). °· Poor physical fitness, which can cause shortness of breath when you exercise. °· Chest or back injuries or stiffness. °· Being overweight. °· Smoking. °· Anxiety, which can make you feel like you are not getting enough air. °DIAGNOSIS  °Serious medical problems can often be found during your physical exam. Tests may also be done to determine why you are having shortness of breath. Tests may include: °· Chest X-rays. °· Lung function tests. °· Blood tests. °· An electrocardiogram (ECG). °· An ambulatory electrocardiogram. An ambulatory ECG records your heartbeat patterns over a 24-hour period. °· Exercise testing. °· A transthoracic echocardiogram (TTE). During echocardiography, sound waves are used to evaluate how blood flows through your heart. °· A transesophageal echocardiogram (TEE). °· Imaging scans. °Your health care provider may not be able to find a cause for your shortness of breath after your exam. In this case, it is important to have a follow-up exam with your health care provider as directed.  °TREATMENT  °Treatment for shortness of breath depends on the cause of your symptoms and can vary greatly. °HOME CARE INSTRUCTIONS  °· Do not smoke. Smoking is a common cause of shortness of breath. If you smoke, ask for help to quit. °· Avoid being around chemicals or things that may bother your breathing, such as paint fumes and dust. °· Rest as needed. Slowly resume your usual activities. °· If medicines were prescribed, take them as directed for the full length of time directed. This  includes oxygen and any inhaled medicines. °· Keep all follow-up appointments as directed by your health care provider. °SEEK MEDICAL CARE IF:  °· Your condition does not improve in the time expected. °· You have a hard time doing your normal activities even with rest. °· You have any new symptoms. °SEEK IMMEDIATE MEDICAL CARE IF:  °· Your shortness of breath gets worse. °· You feel light-headed, faint, or develop a cough not controlled with medicines. °· You start coughing up blood. °· You have pain with breathing. °· You have chest pain or pain in your arms, shoulders, or abdomen. °· You have a fever. °· You are unable to walk up stairs or exercise the way you normally do. °MAKE SURE YOU: °· Understand these instructions. °· Will watch your condition. °· Will get help right away if you are not doing well or get worse. °  °This information is not intended to replace advice given to you by your health care provider. Make sure you discuss any questions you have with your health care provider. °  °Document Released: 07/22/2001 Document Revised: 11/01/2013 Document Reviewed: 01/12/2012 °Elsevier Interactive Patient Education ©2016 [employer] ° °Please return immediately if condition worsens. Please contact her primary physician or the physician you were given for referral. If you have any specialist physicians involved in her treatment and plan please also contact them. Thank you for using [facility] emergency [facility]. ° °

## 2015-09-03 NOTE — ED Notes (Signed)
Pt bib EMS w/ c/o SOB that began Saturday.  Pt is 98% on RA.  Pt denies pain.  Pt sts she has hx of blood clot and wants to know if it moved.

## 2015-09-14 ENCOUNTER — Emergency Department: Payer: Medicare Other

## 2015-09-14 ENCOUNTER — Encounter: Payer: Self-pay | Admitting: Emergency Medicine

## 2015-09-14 ENCOUNTER — Observation Stay
Admission: EM | Admit: 2015-09-14 | Discharge: 2015-09-17 | Disposition: A | Discharge status: Home / Self Care | Payer: Medicare Other | Attending: Internal Medicine | Admitting: Internal Medicine

## 2015-09-14 DIAGNOSIS — M8580 Other specified disorders of bone density and structure, unspecified site: Secondary | ICD-10-CM | POA: Diagnosis not present

## 2015-09-14 DIAGNOSIS — R7989 Other specified abnormal findings of blood chemistry: Principal | ICD-10-CM | POA: Diagnosis present

## 2015-09-14 DIAGNOSIS — J9811 Atelectasis: Secondary | ICD-10-CM | POA: Insufficient documentation

## 2015-09-14 DIAGNOSIS — Z833 Family history of diabetes mellitus: Secondary | ICD-10-CM | POA: Diagnosis not present

## 2015-09-14 DIAGNOSIS — Z96643 Presence of artificial hip joint, bilateral: Secondary | ICD-10-CM | POA: Insufficient documentation

## 2015-09-14 DIAGNOSIS — Z90710 Acquired absence of both cervix and uterus: Secondary | ICD-10-CM | POA: Insufficient documentation

## 2015-09-14 DIAGNOSIS — D72829 Elevated white blood cell count, unspecified: Secondary | ICD-10-CM | POA: Insufficient documentation

## 2015-09-14 DIAGNOSIS — Z89421 Acquired absence of other right toe(s): Secondary | ICD-10-CM | POA: Insufficient documentation

## 2015-09-14 DIAGNOSIS — M5136 Other intervertebral disc degeneration, lumbar region: Secondary | ICD-10-CM | POA: Diagnosis not present

## 2015-09-14 DIAGNOSIS — R0989 Other specified symptoms and signs involving the circulatory and respiratory systems: Secondary | ICD-10-CM | POA: Diagnosis not present

## 2015-09-14 DIAGNOSIS — Z86718 Personal history of other venous thrombosis and embolism: Secondary | ICD-10-CM | POA: Diagnosis not present

## 2015-09-14 DIAGNOSIS — R05 Cough: Secondary | ICD-10-CM | POA: Diagnosis present

## 2015-09-14 DIAGNOSIS — Z85830 Personal history of malignant neoplasm of bone: Secondary | ICD-10-CM | POA: Diagnosis not present

## 2015-09-14 DIAGNOSIS — Z836 Family history of other diseases of the respiratory system: Secondary | ICD-10-CM | POA: Insufficient documentation

## 2015-09-14 DIAGNOSIS — R748 Abnormal levels of other serum enzymes: Secondary | ICD-10-CM | POA: Insufficient documentation

## 2015-09-14 DIAGNOSIS — M1990 Unspecified osteoarthritis, unspecified site: Secondary | ICD-10-CM | POA: Diagnosis not present

## 2015-09-14 DIAGNOSIS — D473 Essential (hemorrhagic) thrombocythemia: Secondary | ICD-10-CM | POA: Insufficient documentation

## 2015-09-14 DIAGNOSIS — R778 Other specified abnormalities of plasma proteins: Secondary | ICD-10-CM | POA: Diagnosis present

## 2015-09-14 DIAGNOSIS — R059 Cough, unspecified: Secondary | ICD-10-CM

## 2015-09-14 DIAGNOSIS — G8929 Other chronic pain: Secondary | ICD-10-CM | POA: Insufficient documentation

## 2015-09-14 DIAGNOSIS — R109 Unspecified abdominal pain: Secondary | ICD-10-CM | POA: Insufficient documentation

## 2015-09-14 DIAGNOSIS — Z825 Family history of asthma and other chronic lower respiratory diseases: Secondary | ICD-10-CM | POA: Diagnosis not present

## 2015-09-14 DIAGNOSIS — Z8261 Family history of arthritis: Secondary | ICD-10-CM | POA: Diagnosis not present

## 2015-09-14 DIAGNOSIS — R531 Weakness: Secondary | ICD-10-CM | POA: Diagnosis not present

## 2015-09-14 DIAGNOSIS — R1110 Vomiting, unspecified: Secondary | ICD-10-CM

## 2015-09-14 DIAGNOSIS — Z79899 Other long term (current) drug therapy: Secondary | ICD-10-CM | POA: Diagnosis not present

## 2015-09-14 DIAGNOSIS — M549 Dorsalgia, unspecified: Secondary | ICD-10-CM

## 2015-09-14 DIAGNOSIS — M359 Systemic involvement of connective tissue, unspecified: Secondary | ICD-10-CM | POA: Insufficient documentation

## 2015-09-14 DIAGNOSIS — J690 Pneumonitis due to inhalation of food and vomit: Secondary | ICD-10-CM | POA: Diagnosis present

## 2015-09-14 DIAGNOSIS — Z8673 Personal history of transient ischemic attack (TIA), and cerebral infarction without residual deficits: Secondary | ICD-10-CM | POA: Diagnosis not present

## 2015-09-14 DIAGNOSIS — Z7901 Long term (current) use of anticoagulants: Secondary | ICD-10-CM | POA: Diagnosis not present

## 2015-09-14 DIAGNOSIS — Z8249 Family history of ischemic heart disease and other diseases of the circulatory system: Secondary | ICD-10-CM | POA: Insufficient documentation

## 2015-09-14 DIAGNOSIS — Z9049 Acquired absence of other specified parts of digestive tract: Secondary | ICD-10-CM | POA: Diagnosis not present

## 2015-09-14 DIAGNOSIS — M545 Low back pain: Secondary | ICD-10-CM | POA: Diagnosis not present

## 2015-09-14 DIAGNOSIS — D45 Polycythemia vera: Secondary | ICD-10-CM | POA: Diagnosis not present

## 2015-09-14 DIAGNOSIS — N390 Urinary tract infection, site not specified: Secondary | ICD-10-CM | POA: Insufficient documentation

## 2015-09-14 DIAGNOSIS — I10 Essential (primary) hypertension: Secondary | ICD-10-CM | POA: Insufficient documentation

## 2015-09-14 DIAGNOSIS — Z79891 Long term (current) use of opiate analgesic: Secondary | ICD-10-CM | POA: Insufficient documentation

## 2015-09-14 HISTORY — DX: Cerebral infarction, unspecified: I63.9

## 2015-09-14 NOTE — ED Notes (Signed)
Pt. States cough for the past couple days.  Pt. Also states rt. Flank pain.

## 2015-09-15 ENCOUNTER — Observation Stay: Payer: Medicare Other

## 2015-09-15 DIAGNOSIS — R778 Other specified abnormalities of plasma proteins: Secondary | ICD-10-CM | POA: Diagnosis present

## 2015-09-15 DIAGNOSIS — R7989 Other specified abnormal findings of blood chemistry: Secondary | ICD-10-CM | POA: Diagnosis not present

## 2015-09-15 LAB — CBC WITH DIFFERENTIAL/PLATELET
BASOS ABS: 0.1 10*3/uL (ref 0–0.1)
Basophils Relative: 0 %
Eosinophils Absolute: 0.7 10*3/uL (ref 0–0.7)
Eosinophils Relative: 3 %
HEMATOCRIT: 54.8 % — AB (ref 35.0–47.0)
Hemoglobin: 16.7 g/dL — ABNORMAL HIGH (ref 12.0–16.0)
Lymphs Abs: 2.2 10*3/uL (ref 1.0–3.6)
MCH: 25 pg — ABNORMAL LOW (ref 26.0–34.0)
MCHC: 30.5 g/dL — ABNORMAL LOW (ref 32.0–36.0)
MCV: 82.1 fL (ref 80.0–100.0)
MONO ABS: 0.8 10*3/uL (ref 0.2–0.9)
Monocytes Relative: 3 %
NEUTROS ABS: 24.6 10*3/uL — AB (ref 1.4–6.5)
Neutrophils Relative %: 86 %
Platelets: 954 10*3/uL (ref 150–440)
RBC: 6.67 MIL/uL — AB (ref 3.80–5.20)
RDW: 23.2 % — ABNORMAL HIGH (ref 11.5–14.5)
WBC: 28.4 10*3/uL — ABNORMAL HIGH (ref 3.6–11.0)

## 2015-09-15 LAB — COMPREHENSIVE METABOLIC PANEL
ALK PHOS: 119 U/L (ref 38–126)
ALT: 10 U/L — AB (ref 14–54)
AST: 22 U/L (ref 15–41)
Albumin: 3 g/dL — ABNORMAL LOW (ref 3.5–5.0)
Anion gap: 5 (ref 5–15)
BILIRUBIN TOTAL: 0.4 mg/dL (ref 0.3–1.2)
BUN: 11 mg/dL (ref 6–20)
CO2: 26 mmol/L (ref 22–32)
CREATININE: 0.81 mg/dL (ref 0.44–1.00)
Calcium: 9.1 mg/dL (ref 8.9–10.3)
Chloride: 108 mmol/L (ref 101–111)
GFR calc Af Amer: >60 mL/min (ref >60)
Glucose, Bld: 110 mg/dL — ABNORMAL HIGH (ref 65–99)
Potassium: 4.7 mmol/L (ref 3.5–5.1)
Sodium: 139 mmol/L (ref 135–145)
TOTAL PROTEIN: 7.2 g/dL (ref 6.5–8.1)

## 2015-09-15 LAB — PROTIME-INR
INR: 2.81
Prothrombin Time: 29.7 seconds — ABNORMAL HIGH (ref 11.4–15.0)

## 2015-09-15 LAB — HEMOGLOBIN A1C: Hgb A1c MFr Bld: 5.1 % (ref 4.0–6.0)

## 2015-09-15 LAB — TROPONIN I
TROPONIN I: 0.1 ng/mL — AB (ref <0.031)
Troponin I: 0.08 ng/mL — ABNORMAL HIGH (ref <0.031)
Troponin I: <0.03 ng/mL (ref <0.031)
Troponin I: 0.03 ng/mL (ref <0.031)

## 2015-09-15 LAB — URINALYSIS COMPLETE WITH MICROSCOPIC (ARMC ONLY)
BILIRUBIN URINE: NEGATIVE
Bacteria, UA: NONE SEEN
GLUCOSE, UA: NEGATIVE mg/dL
Hgb urine dipstick: NEGATIVE
Ketones, ur: NEGATIVE mg/dL
Leukocytes, UA: NEGATIVE
Nitrite: NEGATIVE
PH: 5 (ref 5.0–8.0)
Protein, ur: NEGATIVE mg/dL
Specific Gravity, Urine: 1.008 (ref 1.005–1.030)

## 2015-09-15 LAB — MRSA PCR SCREENING: MRSA by PCR: POSITIVE — AB

## 2015-09-15 LAB — TSH: TSH: 2.234 u[IU]/mL (ref 0.350–4.500)

## 2015-09-15 MED ORDER — MUPIROCIN 2 % EX OINT
1.0000 "application " | TOPICAL_OINTMENT | Freq: Two times a day (BID) | CUTANEOUS | Status: DC
  Administered 2015-09-16 – 2015-09-17 (×3): 1 via NASAL
  Filled 2015-09-15: qty 22

## 2015-09-15 MED ORDER — SODIUM CHLORIDE 0.9 % IJ SOLN: 3.0000 mL | INTRAMUSCULAR | Status: DC | PRN

## 2015-09-15 MED ORDER — FOLIC ACID 1 MG PO TABS
500.0000 ug | ORAL_TABLET | Freq: Every day | ORAL | Status: DC
  Administered 2015-09-15 – 2015-09-17 (×3): 0.5 mg via ORAL
  Filled 2015-09-15 (×3): qty 1

## 2015-09-15 MED ORDER — SODIUM CHLORIDE 0.9 % IJ SOLN
3.0000 mL | Freq: Two times a day (BID) | INTRAMUSCULAR | Status: DC
  Administered 2015-09-15 (×2): 3 mL via INTRAVENOUS

## 2015-09-15 MED ORDER — POTASSIUM CHLORIDE CRYS ER 20 MEQ PO TBCR
20.0000 meq | EXTENDED_RELEASE_TABLET | Freq: Two times a day (BID) | ORAL | Status: DC
  Administered 2015-09-15 – 2015-09-17 (×5): 20 meq via ORAL
  Filled 2015-09-15 (×5): qty 1

## 2015-09-15 MED ORDER — FUROSEMIDE 20 MG PO TABS
20.0000 mg | ORAL_TABLET | Freq: Every day | ORAL | Status: DC
  Administered 2015-09-15 – 2015-09-17 (×3): 20 mg via ORAL
  Filled 2015-09-15 (×3): qty 1

## 2015-09-15 MED ORDER — OXYCODONE-ACETAMINOPHEN 5-325 MG PO TABS
0.5000 | ORAL_TABLET | ORAL | Status: DC | PRN
  Administered 2015-09-15: 0.5 via ORAL
  Filled 2015-09-15: qty 1

## 2015-09-15 MED ORDER — FERROUS SULFATE 325 (65 FE) MG PO TABS
325.0000 mg | ORAL_TABLET | Freq: Two times a day (BID) | ORAL | Status: DC
Start: 2015-09-15 — End: 2015-09-17
  Administered 2015-09-15 – 2015-09-17 (×5): 325 mg via ORAL
  Filled 2015-09-15 (×5): qty 1

## 2015-09-15 MED ORDER — SIMETHICONE 80 MG PO CHEW
40.0000 mg | CHEWABLE_TABLET | Freq: Three times a day (TID) | ORAL | Status: DC
  Administered 2015-09-15 – 2015-09-17 (×5): 40 mg via ORAL
  Filled 2015-09-15 (×8): qty 1

## 2015-09-15 MED ORDER — ASPIRIN EC 81 MG PO TBEC
81.0000 mg | DELAYED_RELEASE_TABLET | Freq: Every day | ORAL | Status: DC
  Administered 2015-09-15 – 2015-09-17 (×3): 81 mg via ORAL
  Filled 2015-09-15 (×4): qty 1

## 2015-09-15 MED ORDER — WARFARIN SODIUM 5 MG PO TABS
5.0000 mg | ORAL_TABLET | Freq: Every day | ORAL | Status: DC
  Administered 2015-09-15 – 2015-09-16 (×2): 5 mg via ORAL
  Filled 2015-09-15 (×2): qty 1

## 2015-09-15 MED ORDER — DOCUSATE SODIUM 100 MG PO CAPS
100.0000 mg | ORAL_CAPSULE | Freq: Two times a day (BID) | ORAL | Status: DC
  Administered 2015-09-15 – 2015-09-17 (×4): 100 mg via ORAL
  Filled 2015-09-15 (×6): qty 1

## 2015-09-15 MED ORDER — SODIUM CHLORIDE 0.9 % IV SOLN
INTRAVENOUS | Status: DC
  Administered 2015-09-15 – 2015-09-17 (×3): via INTRAVENOUS

## 2015-09-15 MED ORDER — DEXTROSE 5 % IV SOLN
1.0000 g | INTRAVENOUS | Status: DC
  Administered 2015-09-15 – 2015-09-16 (×2): 1 g via INTRAVENOUS
  Filled 2015-09-15 (×3): qty 10

## 2015-09-15 MED ORDER — DOCUSATE SODIUM 100 MG PO CAPS: 100.0000 mg | ORAL_CAPSULE | Freq: Two times a day (BID) | ORAL | Status: DC

## 2015-09-15 MED ORDER — OXYCODONE-ACETAMINOPHEN 5-325 MG PO TABS
1.0000 | ORAL_TABLET | ORAL | Status: DC | PRN
  Administered 2015-09-15: 1 via ORAL
  Filled 2015-09-15: qty 1

## 2015-09-15 MED ORDER — OXYCODONE-ACETAMINOPHEN 2.5-325 MG PO TABS: 1.0000 | ORAL_TABLET | ORAL | Status: DC | PRN

## 2015-09-15 MED ORDER — ONDANSETRON HCL 4 MG/2ML IJ SOLN: 4.0000 mg | Freq: Four times a day (QID) | INTRAMUSCULAR | Status: DC | PRN

## 2015-09-15 MED ORDER — ONDANSETRON HCL 4 MG PO TABS: 4.0000 mg | ORAL_TABLET | Freq: Four times a day (QID) | ORAL | Status: DC | PRN

## 2015-09-15 MED ORDER — LEVOFLOXACIN IN D5W 750 MG/150ML IV SOLN
750.0000 mg | Freq: Once | INTRAVENOUS | Status: AC
  Administered 2015-09-15: 750 mg via INTRAVENOUS
  Filled 2015-09-15: qty 150

## 2015-09-15 NOTE — Progress Notes (Signed)
Patient is admitted to room 257 with a diagnosis of elevated troponin. Alert and oriented x 3 and able to make her needs known to staff. Patient denied any acute pain. No respiratory distress noted. Patient is oriented to her room, call bell/ascom and staff. Remain NPO. Skin assessment done with [place] RN, noted redness and worm to touch on the anterior part of the left lower leg. Also noted 2 + pitting edema on the bilateral lower extremities and also healed sacral pressure wound. (Pink foam applied to the sacrum). Colostomy is noted on the left lower abdomen and midline old incision on the abdomen. Will continue to monitor.

## 2015-09-15 NOTE — ED Provider Notes (Signed)
[facility] Emergency Department Provider Note  ____________________________________________  Time seen: Approximately 0006 AM  I have reviewed the triage vital signs and the nursing notes.   HISTORY  Chief Complaint Cough    HPI Yolanda Wells is a 81 y.o. female who comes into the hospital today with a cough and right flank pain. The patient reports that her right side has been hurting for 3-4 days and is worse whenever she coughs. The patient reports that her granddaughter told her she was taking too many pain pills that she stopped taking them today. She reports that she is in because she is hurting too much. The patient has not gone to see her primary care physician and reports the pain is in her right back and side. She denies any pain with urination reports that she's never had this pain before. The patient has not had any fever but has had a cough productive of yellow phlegm. The patient is concerned about pneumonia. She is coughing a little bit when she coughs.The patient rates her pain a 7 out of 10 in intensity.   Past Medical History  Diagnosis Date  . Hypertension   . Deep venous thrombosis (HCC)     right lower extremity  . History of hysterectomy   . History of bilateral hip replacements   . Polycythemia vera ([place])   . Dependent edema   . Arthritis   . Collagen vascular disease ([place])   . Stroke [facility])     Pt reports she was told she had a stroke in may    Patient Active Problem List   Diagnosis Date Noted  . Elevated troponin 09/15/2015  . UTI (lower urinary tract infection) 07/16/2015  . Abdominal wall cellulitis 07/15/2015  . DVT (deep venous thrombosis) ([place]) 07/15/2015  . HTN (hypertension) 07/15/2015  . Polycythemia vera ([place]) 07/15/2015  . Dependent edema 07/15/2015  . Arthritis 07/15/2015  . Pulmonary emboli ([place]) 05/25/2015  . Diverticulosis of colon with hemorrhage 04/02/2015    Past Surgical History  Procedure Laterality  Date  . Toe amputation      right  . Joint replacement      Left and Right Hip  . Abdominal hysterectomy    . Peripheral vascular catheterization N/A 04/02/2015    Procedure: Visceral Angiography;  Surgeon: Algernon Huxley, MD;  Location: [place] CV LAB;  Service: Cardiovascular;  Laterality: N/A;  . Peripheral vascular catheterization N/A 04/02/2015    Procedure: Visceral Artery Intervention;  Surgeon: Algernon Huxley, MD;  Location: [place] CV LAB;  Service: Cardiovascular;  Laterality: N/A;  . Colectomy with colostomy creation/hartmann procedure N/A 04/03/2015    Procedure: COLECTOMY WITH COLOSTOMY CREATION/HARTMANN PROCEDURE;  Surgeon: Sherri Rad, MD;  Location: [facility] ORS;  Service: General;  Laterality: N/A;  . Cardiac catheterization Right 04/03/2015    Procedure: CENTRAL LINE INSERTION;  Surgeon: Sherri Rad, MD;  Location: [facility] ORS;  Service: General;  Laterality: Right;  . Peripheral vascular catheterization N/A 05/25/2015    Procedure: IVC Filter Insertion;  Surgeon: Katha Cabal, MD;  Location: [place] [facility] LAB;  Service: Cardiovascular;  Laterality: N/A;    Current Outpatient Rx  Name  Route  Sig  Dispense  Refill  . docusate sodium (COLACE) 100 MG capsule   Oral   Take 100 mg by mouth 2 (two) times daily.         [name] EXPIRED: enoxaparin (LOVENOX) 150 MG/ML injection   Subcutaneous   Inject 0.65 mLs (100  mg total) into the skin every 12 (twelve) hours.   8 Syringe   0   . ferrous sulfate 325 (65 FE) MG tablet   Oral   Take 325 mg by mouth 2 (two) times daily with a meal.         . folic acid (FOLVITE) 948 MCG tablet   Oral   Take 400 mcg by mouth daily.         . furosemide (LASIX) 20 MG tablet   Oral   Take 20 mg by mouth daily.         . ondansetron (ZOFRAN) 4 MG tablet   Oral   Take 4 mg by mouth every 6 (six) hours as needed for nausea or vomiting.         [name] oxycodone-acetaminophen (PERCOCET) 2.5-325 MG per tablet   Oral   Take 1 tablet by  mouth every 4 (four) hours as needed for pain.   30 tablet   0   . potassium chloride SA (K-DUR,KLOR-CON) 20 MEQ tablet   Oral   Take 1 tablet (20 mEq total) by mouth 2 (two) times daily.   30 tablet   0   . simethicone (MYLICON) 80 MG chewable tablet   Oral   Chew 30 mg by mouth 4 (four) times daily -  before meals and at bedtime.         [name] warfarin (COUMADIN) 5 MG tablet   Oral   Take 1 tablet (5 mg total) by mouth daily at 6 PM.   20 tablet   0     Allergies Review of patient's allergies indicates no known allergies.  Family History  Problem Relation Age of Onset  . Heart attack Father   . Hypertension    . Arthritis-Osteo    . COPD Father   . Diabetes Brother   . Osteosarcoma Son     Social History Social History  Substance Use Topics  . Smoking status: Never Smoker   . Smokeless tobacco: Never Used  . Alcohol Use: No    Review of Systems Constitutional: No fever/chills Eyes: No visual changes. ENT: No sore throat. Cardiovascular: Denies chest pain. Respiratory: Cough Gastrointestinal: Nausea and gagging with No abdominal pain.  no vomiting.  No diarrhea.  No constipation. Genitourinary: Negative for dysuria. Musculoskeletal: Right flank pain Skin: Negative for rash. Neurological: Negative for headaches, focal weakness or numbness.  10-point ROS otherwise negative.  ____________________________________________   PHYSICAL EXAM:  VITAL SIGNS: ED Triage Vitals  Enc Vitals Group     BP 09/14/15 2326 159/95 mmHg     Pulse Rate 09/14/15 2326 91     Resp 09/14/15 2326 24     Temp 09/14/15 2326 98.3 F (36.8 C)     Temp Source 09/14/15 2326 Oral     SpO2 09/14/15 2326 96 %     Weight 09/14/15 2326 225 lb (102.059 kg)     Height 09/14/15 2326 5\' 7"  (1.702 m)     Head Cir --      Peak Flow --      Pain Score 09/14/15 2330 7     Pain Loc --      Pain Edu? --      Excl. in [place]? --     Constitutional: Alert and oriented. Well appearing and in  mild distress. Eyes: Conjunctivae are normal. PERRL. EOMI. Head: Atraumatic. Nose: No congestion/rhinnorhea. Mouth/Throat: Mucous membranes are moist.  Oropharynx non-erythematous. Cardiovascular: Normal rate, regular rhythm. Grossly  normal heart sounds.  Good peripheral circulation. Respiratory: Normal respiratory effort.  No retractions. Lungs CTAB. Gastrointestinal: Soft and nontender. No distention. Positive bowel sounds Musculoskeletal: No lower extremity tenderness nor edema.   Neurologic:  Normal speech and language.  Skin:  Skin is warm, dry and intact.  Psychiatric: Mood and affect are normal.   ____________________________________________   LABS (all labs ordered are listed, but only abnormal results are displayed)  Labs Reviewed  CBC WITH DIFFERENTIAL/PLATELET - Abnormal; Notable for the following:    WBC 28.4 (*)    RBC 6.67 (*)    Hemoglobin 16.7 (*)    HCT 54.8 (*)    MCH 25.0 (*)    MCHC 30.5 (*)    RDW 23.2 (*)    Platelets 954 (*)    Neutro Abs 24.6 (*)    All other components within normal limits  COMPREHENSIVE METABOLIC PANEL - Abnormal; Notable for the following:    Glucose, Bld 110 (*)    Albumin 3.0 (*)    ALT 10 (*)    All other components within normal limits  TROPONIN I - Abnormal; Notable for the following:    Troponin I 0.10 (*)    All other components within normal limits  URINALYSIS COMPLETEWITH MICROSCOPIC ([facility] ONLY) - Abnormal; Notable for the following:    Color, Urine YELLOW (*)    APPearance CLEAR (*)    Squamous Epithelial / LPF 0-5 (*)    All other components within normal limits  CULTURE, BLOOD (ROUTINE X 2)  CULTURE, BLOOD (ROUTINE X 2)   ____________________________________________  EKG  ED ECG REPORT I, Loney Hering, the attending physician, personally viewed and interpreted this ECG.   Date: 09/14/2015  EKG Time: 2327  Rate: 92  Rhythm: normal sinus rhythm, RBBB  Axis: left axis  Intervals:right bundle branch  block  ST&T Change: none  ____________________________________________  RADIOLOGY  Chest x-ray: Stable cardiomegaly with mild pulmonary vascular congestion, right lung base atelectasis. ____________________________________________   PROCEDURES  Procedure(s) performed: None  Critical Care performed: No  ____________________________________________   INITIAL IMPRESSION / ASSESSMENT AND PLAN / ED COURSE  Pertinent labs & imaging results that were available during my care of the patient were reviewed by me and considered in my medical decision making (see chart for details).  This is an 81 year old female who comes in today with some cough and right flank pain. I did check the patient's urine to ensure she did not have a UTI. I also did a chest x-ray to insure she didn't have pneumonia which may also cause flank pain. The patient does have a history of leukocytosis which is continued today. The patient does have atelectasis in her right lung base and is difficult to determine if that is a pneumonia or not. Given the patient's productive cough and her leukocytosis I will treat the patient with a dose of leveofloxacin. The patient also has an elevated troponin which is not a historical lab abnormality for the patient. Given this troponin as well as the symptoms the patient is having I will admit her to the hospital for further evaluation. ____________________________________________   FINAL CLINICAL IMPRESSION(S) / ED DIAGNOSES  Final diagnoses:  Cough  Aspiration pneumonia of right lower lobe, unspecified aspiration pneumonia type ([place])  Elevated troponin      Loney Hering, MD 09/15/15 0234

## 2015-09-15 NOTE — Progress Notes (Signed)
Patient resting comfortably at this time. No complaints at this time, will cont to assess. NSR on telemetry. Wilnette Kales

## 2015-09-15 NOTE — Consult Note (Signed)
[place]  CARDIOLOGY CONSULT NOTE  Patient ID: Yolanda Wells MRN: [id number] DOB/AGE: 1932-01-29 81 y.o.  Admit date: 09/14/2015 Referring Physician Tressia Miners Primary Physician  [facility] Primary Cardiologist [facility] Reason for Consultation Abnormal troponin  HPI: Pt who presented to the er with complaints of right sided back pain. Has history of diverticulitis s/p partial colon recection earlier this year, PRV, hypertension. She has a minld troponin elevation to 0.10. No eck changes or chest pain. She is on warfarin for there dvt that occurred at time of colon surgery. She is treated with amlodipine, furosemide. CXR showed stable cardiomegaly with mild pulmonary vascular congestion and rll atelectasis. Echo done in 7/16 revealed normal lv function 65-70%  ROS Review of Systems - General ROS: positive for  - right back pain Respiratory ROS: no cough, shortness of breath, or wheezing Cardiovascular ROS: no chest pain or dyspnea on exertion Gastrointestinal ROS: no abdominal pain, change in bowel habits, or black or bloody stools Neurological ROS: no TIA or stroke symptoms   Past Medical History  Diagnosis Date  . Hypertension   . Deep venous thrombosis (HCC)     right lower extremity  . History of hysterectomy   . History of bilateral hip replacements   . Polycythemia vera ([place])   . Dependent edema   . Arthritis   . Collagen vascular disease ([place])   . Stroke [facility])     Pt reports she was told she had a stroke in may    Family History  Problem Relation Age of Onset  . Heart attack Father   . Hypertension    . Arthritis-Osteo    . COPD Father   . Diabetes Brother   . Osteosarcoma Son     Social History   Social History  . Marital Status: Widowed    Spouse Name: N/A  . Number of Children: N/A  . Years of Education: N/A   Occupational History  . Not on file.   Social History Main Topics  . Smoking status: Never Smoker   .  Smokeless tobacco: Never Used  . Alcohol Use: No  . Drug Use: No  . Sexual Activity: Not on file   Other Topics Concern  . Not on file   Social History Narrative   Lives at [facility]    Past Surgical History  Procedure Laterality Date  . Toe amputation      right  . Joint replacement      Left and Right Hip  . Abdominal hysterectomy    . Peripheral vascular catheterization N/A 04/02/2015    Procedure: Visceral Angiography;  Surgeon: Algernon Huxley, MD;  Location: [place] CV LAB;  Service: Cardiovascular;  Laterality: N/A;  . Peripheral vascular catheterization N/A 04/02/2015    Procedure: Visceral Artery Intervention;  Surgeon: Algernon Huxley, MD;  Location: [place] CV LAB;  Service: Cardiovascular;  Laterality: N/A;  . Colectomy with colostomy creation/hartmann procedure N/A 04/03/2015    Procedure: COLECTOMY WITH COLOSTOMY CREATION/HARTMANN PROCEDURE;  Surgeon: Sherri Rad, MD;  Location: [facility] ORS;  Service: General;  Laterality: N/A;  . Cardiac catheterization Right 04/03/2015    Procedure: CENTRAL LINE INSERTION;  Surgeon: Sherri Rad, MD;  Location: [facility] ORS;  Service: General;  Laterality: Right;  . Peripheral vascular catheterization N/A 05/25/2015    Procedure: IVC Filter Insertion;  Surgeon: Katha Cabal, MD;  Location: [place] CV LAB;  Service: Cardiovascular;  Laterality: N/A;  Prescriptions prior to admission  Medication Sig Dispense Refill Last Dose  . docusate sodium (COLACE) 100 MG capsule Take 100 mg by mouth 2 (two) times daily.   07/15/2015 at Unknown time  . enoxaparin (LOVENOX) 150 MG/ML injection Inject 0.65 mLs (100 mg total) into the skin every 12 (twelve) hours. 8 Syringe 0   . ferrous sulfate 325 (65 FE) MG tablet Take 325 mg by mouth 2 (two) times daily with a meal.   07/15/2015 at Unknown time  . folic acid (FOLVITE) 240 MCG tablet Take 400 mcg by mouth daily.   07/15/2015 at Unknown time  . furosemide (LASIX) 20 MG tablet Take 20 mg by mouth  daily.   07/15/2015 at Unknown time  . ondansetron (ZOFRAN) 4 MG tablet Take 4 mg by mouth every 6 (six) hours as needed for nausea or vomiting.   07/15/2015 at Unknown time  . oxycodone-acetaminophen (PERCOCET) 2.5-325 MG per tablet Take 1 tablet by mouth every 4 (four) hours as needed for pain. 30 tablet 0   . potassium chloride SA (K-DUR,KLOR-CON) 20 MEQ tablet Take 1 tablet (20 mEq total) by mouth 2 (two) times daily. 30 tablet 0   . simethicone (MYLICON) 80 MG chewable tablet Chew 30 mg by mouth 4 (four) times daily -  before meals and at bedtime.   07/15/2015 at Unknown time  . warfarin (COUMADIN) 5 MG tablet Take 1 tablet (5 mg total) by mouth daily at 6 PM. 20 tablet 0     Physical Exam: Blood pressure 123/72, pulse 78, temperature 98.1 F (36.7 C), temperature source Oral, resp. rate 16, height 5\' 7"  (1.702 m), weight 93.532 kg (206 lb 3.2 oz), SpO2 95 %.    General appearance: cooperative Resp: clear to auscultation bilaterally Cardio: regular rate and rhythm GI: soft, non-tender; bowel sounds normal; no masses,  no organomegaly Extremities: extremities normal, atraumatic, no cyanosis or edema Neurologic: Grossly normal Labs:   Lab Results  Component Value Date   WBC 28.4* 09/15/2015   HGB 16.7* 09/15/2015   HCT 54.8* 09/15/2015   MCV 82.1 09/15/2015   PLT 954* 09/15/2015    Recent Labs Lab 09/15/15 0003  NA 139  K 4.7  CL 108  CO2 26  BUN 11  CREATININE 0.81  CALCIUM 9.1  PROT 7.2  BILITOT 0.4  ALKPHOS 119  ALT 10*  AST 22  GLUCOSE 110*   Lab Results  Component Value Date   CKMB 1.0 02/20/2012   TROPONINI <0.03 09/15/2015      Radiology: Mild pulmonary congestion.  EKG: NSR no ischemia, rbbb  ASSESSMENT AND PLAN:  81 yo with history of hypertension, diverticulitis, prv, admitted with back pain. Admission troponin was 0.10 with current troponin was 0.08. No chest pain. Elevation does not appear to be acs. Likely demand ischemia. Would continue with warfarin  with inr goal of 2-3. Conitnue with emperic abx. Continue gentle diuresis following electrolytes and hemodynamics. Will follow with you Signed: Teodoro Spray MD, [facility] 09/15/2015, 10:52 AM

## 2015-09-15 NOTE — ED Notes (Addendum)
Pt's granddaughter, Yolanda Wells, would like to be called if pt is admitted.  Pt gave consent to talk to granddaughter.  Phone number for grand daughter is [phone]

## 2015-09-15 NOTE — ED Notes (Signed)
Pt's left lower leg, on the anterior aspect is warm and reddened.

## 2015-09-15 NOTE — Progress Notes (Signed)
[place] [facility] [place] NAME: Yolanda Wells    MR#:  [id number]  DATE OF BIRTH:  22-Aug-1932  SUBJECTIVE:  CHIEF COMPLAINT:   Chief Complaint  Patient presents with  . Cough    Pt. here via EMS for cough and rt. flank pain.   - Admitted for significant back pain. Currently rates pain around 4 x 10. -Sometimes pain radiating to right flank.  REVIEW OF SYSTEMS:  Review of Systems  Constitutional: Negative for fever and chills.  HENT: Negative for ear discharge, ear pain and nosebleeds.   Eyes: Negative for double vision and photophobia.  Respiratory: Negative for cough, sputum production, shortness of breath and wheezing.   Cardiovascular: Negative for chest pain, palpitations and leg swelling.  Gastrointestinal: Negative for nausea, vomiting, abdominal pain, diarrhea and constipation.  Genitourinary: Negative for dysuria.  Musculoskeletal: Positive for back pain. Negative for myalgias.  Neurological: Negative for dizziness, tingling, sensory change, speech change, focal weakness, seizures, weakness and headaches.  Psychiatric/Behavioral: Negative for depression.    DRUG ALLERGIES:  No Known Allergies  VITALS:  Blood pressure 136/70, pulse 78, temperature 98.1 F (36.7 C), temperature source Oral, resp. rate 16, height 5\' 7"  (1.702 m), weight 93.532 kg (206 lb 3.2 oz), SpO2 95 %.  PHYSICAL EXAMINATION:  Physical Exam  GENERAL:  81 y.o.-year-old patient lying in the bed with no acute distress.  EYES: Pupils equal, round, reactive to light and accommodation. No scleral icterus. Extraocular muscles intact.  HEENT: Head atraumatic, normocephalic. Oropharynx and nasopharynx clear.  NECK:  Supple, no jugular venous distention. No thyroid enlargement, no tenderness.  LUNGS: Normal breath sounds bilaterally, no wheezing, rales,rhonchi or crepitation. No use of accessory muscles of respiration.  CARDIOVASCULAR: S1, S2 normal. No  rubs,  or gallops. 3/6 systolic murmur present ABDOMEN: Soft, nontender, nondistended. Bowel sounds present. No organomegaly or mass.  EXTREMITIES: No pedal edema, cyanosis, or clubbing. Straight  leg raising test is negative. NEUROLOGIC: Cranial nerves II through XII are intact. Muscle strength 5/5 in all extremities. Sensation intact. Gait not checked.  PSYCHIATRIC: The patient is alert and oriented x 3.  SKIN: No obvious rash, lesion, or ulcer.    LABORATORY PANEL:   CBC  Recent Labs Lab 09/15/15 0003  WBC 28.4*  HGB 16.7*  HCT 54.8*  PLT 954*   ------------------------------------------------------------------------------------------------------------------  Chemistries   Recent Labs Lab 09/15/15 0003  NA 139  K 4.7  CL 108  CO2 26  GLUCOSE 110*  BUN 11  CREATININE 0.81  CALCIUM 9.1  AST 22  ALT 10*  ALKPHOS 119  BILITOT 0.4   ------------------------------------------------------------------------------------------------------------------  Cardiac Enzymes  Recent Labs Lab 09/15/15 1006  TROPONINI <0.03   ------------------------------------------------------------------------------------------------------------------  RADIOLOGY:  Dg Chest 2 View  09/15/2015  CLINICAL DATA:  Cough for a few days, RIGHT flank pain. EXAM: CHEST  2 VIEW COMPARISON:  CT chest September 03, 2015 FINDINGS: Cardiac silhouette is mildly enlarged. Tortuous mildly calcified aortic. Mildly elevated RIGHT hemidiaphragm. Mild pulmonary vascular congestion without pleural effusion or focal consolidation anti densities RIGHT lung base. No pneumothorax. Severe degenerative changes shoulders. Osteopenia. Soft tissue planes are nonsuspicious. Surgical clips in the abdomen. Inferior vena cava filter seen on the lateral radiograph. IMPRESSION: Stable cardiomegaly with mild pulmonary vascular congestion. RIGHT lung base atelectasis. Electronically Signed   By: Elon Alas M.D.   On: 09/15/2015 00:04     EKG:   Orders placed or performed during the hospital encounter of 09/14/15  .  ED EKG  . ED EKG    ASSESSMENT AND PLAN:   81 year old female with past medical history significant for hypertension, history of DVT, polycythemia vera, arthritis presents to the hospital secondary to significant back pain radiating to right flank.  #1 Back pain-radiating to right flank. Never had history of prior back pain. -Lumbar spine x-ray. -If not improving, will need to look for renal colic from a kidney stone as well. -Gentle hydration, pain medication. -Heating pad for now. Straight leg raising test is negative. No radicular symptoms noted. -Physical therapy consult  #2 UTI-continue rocephin  #3 history of DVT-on warfarin. -INR is therapeutic  #4 elevated troponin-likely demand ischemia. -Appreciate cardiology consult. No further cardiac testing needed.  #5 polycythemia vera-with leukocytosis, chronic erythrocytosis and thrombocytosis. -Follow up with oncology for phlebotomy -Outpatient oncology consult   #6 DVT prophylaxis-on warfarin  All the records are reviewed and case discussed with Care Management/Social Workerr. Management plans discussed with the patient, family and they are in agreement.  CODE STATUS: Full Code  TOTAL TIME TAKING CARE OF THIS PATIENT: 38 minutes.   POSSIBLE D/C IN 1-2 DAYS, DEPENDING ON CLINICAL CONDITION.   Chasady Longwell M.D on 09/15/2015 at 1:19 PM  Between 7am to 6pm - Pager - [phone]  After 6pm go to [email or web address] - password [email or web address] [place] [facility]  Office  [phone]  CC: Primary care physician; PROVIDER NOT IN SYSTEM

## 2015-09-15 NOTE — Progress Notes (Signed)
Spoke with granddaughter about patient's condition with patient's consent and password. Updated on plan of care and admitting diagnosis. Granddaughter stated that had already spoke with Dr. Marcille Blanco earlier when patient originally admitted. Wilnette Kales

## 2015-09-15 NOTE — H&P (Signed)
Yolanda Wells is an 81 y.o. female.   Chief Complaint: Flank pain HPI: The patient presents to the emergency department from her nursing home complaining of right-sided back and flank pain.  She denies chest pain or shortness of breath.  Chest xray shows right basilar atelectasis.  The patient denies urinary symptoms.  Laboratory evaluation incidentally reveals elevated troponin which prompted the ED physician to call for admission.    Past Medical History  Diagnosis Date  . Hypertension   . Deep venous thrombosis (HCC)     right lower extremity  . History of hysterectomy   . History of bilateral hip replacements   . Polycythemia vera ([place])   . Dependent edema   . Arthritis   . Collagen vascular disease ([place])   . Stroke [facility])     Pt reports she was told she had a stroke in may    Past Surgical History  Procedure Laterality Date  . Toe amputation      right  . Joint replacement      Left and Right Hip  . Abdominal hysterectomy    . Peripheral vascular catheterization N/A 04/02/2015    Procedure: Visceral Angiography;  Surgeon: Algernon Huxley, MD;  Location: [facility];  Service: Cardiovascular;  Laterality: N/A;  . Peripheral vascular catheterization N/A 04/02/2015    Procedure: Visceral Artery Intervention;  Surgeon: Algernon Huxley, MD;  Location: [facility];  Service: Cardiovascular;  Laterality: N/A;  . Colectomy with colostomy creation/hartmann procedure N/A 04/03/2015    Procedure: COLECTOMY WITH COLOSTOMY CREATION/HARTMANN PROCEDURE;  Surgeon: Sherri Rad, MD;  Location: [facility];  Service: General;  Laterality: N/A;  . Cardiac catheterization Right 04/03/2015    Procedure: CENTRAL LINE INSERTION;  Surgeon: Sherri Rad, MD;  Location: [facility] OR[facility];  Service: General;  Laterality: Right;  . Peripheral vascular catheterization N/A 05/25/2015    Procedure: IVC Filter Insertion;  Surgeon: Katha Cabal, MD;  Location: [facility];  Service: Cardiovascular;   Laterality: N/A;    Family History  Problem Relation Age of Onset  . Heart attack Father   . Hypertension    . Arthritis-Osteo    . COPD Father   . Diabetes Brother   . Osteosarcoma Son    Social History:  reports that she has never smoked. She has never used smokeless tobacco. She reports that she does not drink alcohol or use illicit drugs.  Allergies: No Known Allergies  Medications Prior to Admission  Medication Sig Dispense Refill  . docusate sodium (COLACE) 100 MG capsule Take 100 mg by mouth 2 (two) times daily.    [name] enoxaparin (LOVENOX) 150 MG/ML injection Inject 0.65 mLs (100 mg total) into the skin every 12 (twelve) hours. 8 Syringe 0  . ferrous sulfate 325 (65 FE) MG tablet Take 325 mg by mouth 2 (two) times daily with a meal.    . folic acid (FOLVITE) 950 MCG tablet Take 400 mcg by mouth daily.    . furosemide (LASIX) 20 MG tablet Take 20 mg by mouth daily.    . ondansetron (ZOFRAN) 4 MG tablet Take 4 mg by mouth every 6 (six) hours as needed for nausea or vomiting.    [name] oxycodone-acetaminophen (PERCOCET) 2.5-325 MG per tablet Take 1 tablet by mouth every 4 (four) hours as needed for pain. 30 tablet 0  . potassium chloride SA (K-DUR,KLOR-CON) 20 MEQ tablet Take 1 tablet (20 mEq total) by mouth 2 (two) times daily. 30 tablet  0  . simethicone (MYLICON) 80 MG chewable tablet Chew 30 mg by mouth 4 (four) times daily -  before meals and at bedtime.    [name] warfarin (COUMADIN) 5 MG tablet Take 1 tablet (5 mg total) by mouth daily at 6 PM. 20 tablet 0    Results for orders placed or performed during the hospital encounter of 09/14/15 (from the past 48 hour(s))  CBC with Differential     Status: Abnormal   Collection Time: 09/15/15 12:03 AM  Result Value Ref Range   WBC 28.4 (H) 3.6 - 11.0 K/uL   RBC 6.67 (H) 3.80 - 5.20 MIL/uL   Hemoglobin 16.7 (H) 12.0 - 16.0 g/dL    Comment: RESULT REPEATED AND VERIFIED   HCT 54.8 (H) 35.0 - 47.0 %    Comment: RESULT REPEATED AND VERIFIED    MCV 82.1 80.0 - 100.0 fL   MCH 25.0 (L) 26.0 - 34.0 pg   MCHC 30.5 (L) 32.0 - 36.0 g/dL   RDW 23.2 (H) 11.5 - 14.5 %   Platelets 954 (HH) 150 - 440 K/uL    Comment: PLATELET COUNT CONFIRMED BY SMEAR CRITICAL RESULT CALLED TO, READ BACK BY AND VERIFIED WITH: MATT MARTIN AT 0125 ON 09/15/15 RWW    Neutrophils Relative % 86% %   Neutro Abs 24.6 (H) 1.4 - 6.5 K/uL   Lymphocytes Relative 8% %   Lymphs Abs 2.2 1.0 - 3.6 K/uL   Monocytes Relative 3% %   Monocytes Absolute 0.8 0.2 - 0.9 K/uL   Eosinophils Relative 3% %   Eosinophils Absolute 0.7 0 - 0.7 K/uL   Basophils Relative 0% %   Basophils Absolute 0.1 0 - 0.1 K/uL  Comprehensive metabolic panel     Status: Abnormal   Collection Time: 09/15/15 12:03 AM  Result Value Ref Range   Sodium 139 135 - 145 mmol/L   Potassium 4.7 3.5 - 5.1 mmol/L    Comment: HEMOLYSIS AT THIS LEVEL MAY AFFECT RESULT   Chloride 108 101 - 111 mmol/L   CO2 26 22 - 32 mmol/L   Glucose, Bld 110 (H) 65 - 99 mg/dL   BUN 11 6 - 20 mg/dL   Creatinine, Ser 0.81 0.44 - 1.00 mg/dL   Calcium 9.1 8.9 - 10.3 mg/dL   Total Protein 7.2 6.5 - 8.1 g/dL   Albumin 3.0 (L) 3.5 - 5.0 g/dL   AST 22 15 - 41 U/L   ALT 10 (L) 14 - 54 U/L   Alkaline Phosphatase 119 38 - 126 U/L   Total Bilirubin 0.4 0.3 - 1.2 mg/dL   GFR calc non Af Amer >60 >60 mL/min   GFR calc Af Amer >60 >60 mL/min    Comment: (NOTE) The eGFR has been calculated using the CKD EPI equation. This calculation has not been validated in all clinical situations. eGFR's persistently <60 mL/min signify possible Chronic Kidney Disease.    Anion gap 5 5 - 15  Troponin I     Status: Abnormal   Collection Time: 09/15/15 12:03 AM  Result Value Ref Range   Troponin I 0.10 (H) <0.031 ng/mL    Comment: READ BACK AND VERIFIED WITH MATTHEW MARTIN AT 4098 09/15/15.PMH        PERSISTENTLY INCREASED TROPONIN VALUES IN THE RANGE OF 0.04-0.49 ng/mL CAN BE SEEN IN:       -UNSTABLE ANGINA       -CONGESTIVE HEART  FAILURE       -MYOCARDITIS       -  CHEST TRAUMA       -ARRYHTHMIAS       -LATE PRESENTING MYOCARDIAL INFARCTION       -COPD   CLINICAL FOLLOW-UP RECOMMENDED.   Urinalysis complete, with microscopic ([facility] only)     Status: Abnormal   Collection Time: 09/15/15  1:19 AM  Result Value Ref Range   Color, Urine YELLOW (A) YELLOW   APPearance CLEAR (A) CLEAR   Glucose, UA NEGATIVE NEGATIVE mg/dL   Bilirubin Urine NEGATIVE NEGATIVE   Ketones, ur NEGATIVE NEGATIVE mg/dL   Specific Gravity, Urine 1.008 1.005 - 1.030   Hgb urine dipstick NEGATIVE NEGATIVE   pH 5.0 5.0 - 8.0   Protein, ur NEGATIVE NEGATIVE mg/dL   Nitrite NEGATIVE NEGATIVE   Leukocytes, UA NEGATIVE NEGATIVE   RBC / HPF 0-5 0 - 5 RBC/hpf   WBC, UA 0-5 0 - 5 WBC/hpf   Bacteria, UA NONE SEEN NONE SEEN   Squamous Epithelial / LPF 0-5 (A) NONE SEEN   Mucous PRESENT    Dg Chest 2 View  09/15/2015  CLINICAL DATA:  Cough for a few days, RIGHT flank pain. EXAM: CHEST  2 VIEW COMPARISON:  CT chest September 03, 2015 FINDINGS: Cardiac silhouette is mildly enlarged. Tortuous mildly calcified aortic. Mildly elevated RIGHT hemidiaphragm. Mild pulmonary vascular congestion without pleural effusion or focal consolidation anti densities RIGHT lung base. No pneumothorax. Severe degenerative changes shoulders. Osteopenia. Soft tissue planes are nonsuspicious. Surgical clips in the abdomen. Inferior vena cava filter seen on the lateral radiograph. IMPRESSION: Stable cardiomegaly with mild pulmonary vascular congestion. RIGHT lung base atelectasis. Electronically Signed   By: Elon Alas M.D.   On: 09/15/2015 00:04    Review of Systems  Constitutional: Negative for fever and chills.  HENT: Negative for sore throat and tinnitus.   Eyes: Negative for blurred vision and redness.  Respiratory: Negative for cough and shortness of breath.   Cardiovascular: Negative for chest pain, palpitations, orthopnea and PND.  Gastrointestinal: Negative for  nausea, vomiting, abdominal pain and diarrhea.  Genitourinary: Negative for dysuria, urgency and frequency.  Musculoskeletal: Positive for back pain (right side flank pain). Negative for myalgias and joint pain.  Skin: Negative for rash.       No lesions  Neurological: Negative for speech change, focal weakness and weakness.  Endo/Heme/Allergies: Does not bruise/bleed easily.       No temperature intolerance  Psychiatric/Behavioral: Negative for depression and suicidal ideas.    Blood pressure 151/82, pulse 82, temperature 98.1 F (36.7 C), temperature source Oral, resp. rate 16, height _0  (1.702 m), weight 93.532 kg (206 lb 3.2 oz), SpO2 95 %. Physical Exam  Nursing note and vitals reviewed. Constitutional: She is oriented to person, place, and time. She appears well-developed and well-nourished. No distress.  HENT:  Head: Normocephalic and atraumatic.  Mouth/Throat: Oropharynx is clear and moist.  Eyes: Conjunctivae and EOM are normal. Pupils are equal, round, and reactive to light. Right eye exhibits discharge. No scleral icterus.  Neck: Normal range of motion. Neck supple. No JVD present. No tracheal deviation present. No thyromegaly present.  Cardiovascular: Normal rate, regular rhythm and normal heart sounds.  Exam reveals no gallop and no friction rub.   No murmur heard. Respiratory: Effort normal and breath sounds normal.  GI: Soft. Bowel sounds are normal. She exhibits no distension. There is no tenderness.  Colostomy   Genitourinary:  Deferred  Musculoskeletal: Normal range of motion. She exhibits edema.  Lymphadenopathy:    She has no  cervical adenopathy.  Neurological: She is alert and oriented to person, place, and time. No cranial nerve deficit. She exhibits normal muscle tone.  Skin: Skin is warm and dry. No rash noted. No erythema.  Psychiatric: She has a normal mood and affect. Her behavior is normal. Judgment and thought content normal.      Assessment/Plan This is an 81 year old African-American female admitted for elevated troponins. 1. Elevated troponin: Patient denies chest pain; no EKG changes indicative of ischemia. Nonetheless, continue to cycle cardiac enzymes. Cardiology consult placed. 2. Flank pain: Unclear etiology. May be secondary to atelectasis or scarring of the right lower lung lobe, muscle strain or hyperviscosity secondary to polycythemia. The patient was given Levaquin in the emergency department potentially cover pneumonia or urinary tract infection however there is no evidence of either. No fever, nausea or vomiting to indicate pyelonephritis or renal abscess. 3. Polycythemia vera: Persistently elevated leukocytes as well as hematocrit and thrombocytosis. Continue folic acid supplement. (Hydroxyurea?). 4. Chronic DVT/PE: Continue warfarin Exline  5. DVT prophylaxis: As above 6. GI prophylaxis: None The patient is a full code. Time spent on admission orders and patient care approximately 35 minutes  Harrie Foreman 09/15/2015, 5:34 AM

## 2015-09-15 NOTE — ED Notes (Signed)
Reported to Dr. Dahlia Client elevated troponin of 0.10.

## 2015-09-16 DIAGNOSIS — R7989 Other specified abnormal findings of blood chemistry: Secondary | ICD-10-CM | POA: Diagnosis not present

## 2015-09-16 LAB — CBC
HEMATOCRIT: 50.6 % — AB (ref 35.0–47.0)
HEMOGLOBIN: 16 g/dL (ref 12.0–16.0)
MCH: 25.9 pg — ABNORMAL LOW (ref 26.0–34.0)
MCHC: 31.5 g/dL — ABNORMAL LOW (ref 32.0–36.0)
MCV: 82.1 fL (ref 80.0–100.0)
Platelets: 835 10*3/uL — ABNORMAL HIGH (ref 150–440)
RBC: 6.17 MIL/uL — AB (ref 3.80–5.20)
RDW: 22.7 % — AB (ref 11.5–14.5)
WBC: 23.7 10*3/uL — AB (ref 3.6–11.0)

## 2015-09-16 LAB — BASIC METABOLIC PANEL
ANION GAP: 5 (ref 5–15)
BUN: 8 mg/dL (ref 6–20)
CALCIUM: 8.4 mg/dL — AB (ref 8.9–10.3)
CO2: 25 mmol/L (ref 22–32)
Chloride: 110 mmol/L (ref 101–111)
Creatinine, Ser: 0.61 mg/dL (ref 0.44–1.00)
GFR calc non Af Amer: >60 mL/min (ref >60)
Glucose, Bld: 80 mg/dL (ref 65–99)
POTASSIUM: 4.2 mmol/L (ref 3.5–5.1)
Sodium: 140 mmol/L (ref 135–145)

## 2015-09-16 LAB — PROTIME-INR
INR: 2.74
Prothrombin Time: 29.1 seconds — ABNORMAL HIGH (ref 11.4–15.0)

## 2015-09-16 MED ORDER — OXYCODONE-ACETAMINOPHEN 5-325 MG PO TABS: 1.0000 | ORAL_TABLET | Freq: Four times a day (QID) | ORAL | Status: DC | PRN

## 2015-09-16 MED ORDER — CEPHALEXIN 500 MG PO CAPS: 500.0000 mg | ORAL_CAPSULE | Freq: Two times a day (BID) | ORAL | Status: DC

## 2015-09-16 MED ORDER — ASPIRIN EC 81 MG PO TBEC: 81.0000 mg | DELAYED_RELEASE_TABLET | Freq: Every day | ORAL | Status: DC

## 2015-09-16 MED ORDER — CYCLOBENZAPRINE HCL 5 MG PO TABS
7.5000 mg | ORAL_TABLET | Freq: Three times a day (TID) | ORAL | Status: DC | PRN
  Filled 2015-09-16: qty 1.5

## 2015-09-16 MED ORDER — MUPIROCIN 2 % EX OINT: 1.0000 "application " | TOPICAL_OINTMENT | Freq: Two times a day (BID) | CUTANEOUS | Status: DC

## 2015-09-16 NOTE — Progress Notes (Signed)
[facility] [place] NAME: Yolanda Wells    MR#:  [id number]  DATE OF BIRTH:  1932/10/05  SUBJECTIVE:  CHIEF COMPLAINT:   Chief Complaint  Patient presents with  . Cough    Pt. here via EMS for cough and rt. flank pain.   - back pain is much better today, but couldn't walk with physical therapy due to lower legs weakness - will need rehab  REVIEW OF SYSTEMS:  Review of Systems  Constitutional: Negative for fever and chills.  HENT: Negative for ear discharge, ear pain and nosebleeds.   Eyes: Negative for double vision and photophobia.  Respiratory: Negative for cough, sputum production, shortness of breath and wheezing.   Cardiovascular: Negative for chest pain, palpitations and leg swelling.  Gastrointestinal: Negative for nausea, vomiting, abdominal pain, diarrhea and constipation.  Genitourinary: Negative for dysuria.  Musculoskeletal: Positive for back pain. Negative for myalgias.  Neurological: Negative for dizziness, tingling, sensory change, speech change, focal weakness, seizures, weakness and headaches.  Psychiatric/Behavioral: Negative for depression.    DRUG ALLERGIES:  No Known Allergies  VITALS:  Blood pressure 133/88, pulse 88, temperature 98.2 F (36.8 C), temperature source Oral, resp. rate 18, height 5\' 7"  (1.702 m), weight 92.352 kg (203 lb 9.6 oz), SpO2 96 %.  PHYSICAL EXAMINATION:  Physical Exam  GENERAL:  81 y.o.-year-old patient lying in the bed with no acute distress.  EYES: Pupils equal, round, reactive to light and accommodation. No scleral icterus. Extraocular muscles intact.  HEENT: Head atraumatic, normocephalic. Oropharynx and nasopharynx clear.  NECK:  Supple, no jugular venous distention. No thyroid enlargement, no tenderness.  LUNGS: Normal breath sounds bilaterally, no wheezing, rales,rhonchi or crepitation. No use of accessory muscles of respiration.  CARDIOVASCULAR: S1, S2 normal. No   rubs, or gallops. 3/6 systolic murmur present ABDOMEN: Soft, nontender, nondistended. Bowel sounds present. No organomegaly or mass.  EXTREMITIES: No pedal edema, cyanosis, or clubbing. Straight  leg raising test is negative. NEUROLOGIC: Cranial nerves II through XII are intact. Muscle strength 5/5 in all extremities. Sensation intact. Gait not checked.  PSYCHIATRIC: The patient is alert and oriented x 3.  SKIN: No obvious rash, lesion, or ulcer.    LABORATORY PANEL:   CBC  Recent Labs Lab 09/16/15 0441  WBC 23.7*  HGB 16.0  HCT 50.6*  PLT 835*   ------------------------------------------------------------------------------------------------------------------  Chemistries   Recent Labs Lab 09/15/15 0003 09/16/15 0441  NA 139 140  K 4.7 4.2  CL 108 110  CO2 26 25  GLUCOSE 110* 80  BUN 11 8  CREATININE 0.81 0.61  CALCIUM 9.1 8.4*  AST 22  --   ALT 10*  --   ALKPHOS 119  --   BILITOT 0.4  --    ------------------------------------------------------------------------------------------------------------------  Cardiac Enzymes  Recent Labs Lab 09/15/15 1544  TROPONINI 0.03   ------------------------------------------------------------------------------------------------------------------  RADIOLOGY:  Dg Chest 2 View  09/15/2015  CLINICAL DATA:  Cough for a few days, RIGHT flank pain. EXAM: CHEST  2 VIEW COMPARISON:  CT chest September 03, 2015 FINDINGS: Cardiac silhouette is mildly enlarged. Tortuous mildly calcified aortic. Mildly elevated RIGHT hemidiaphragm. Mild pulmonary vascular congestion without pleural effusion or focal consolidation anti densities RIGHT lung base. No pneumothorax. Severe degenerative changes shoulders. Osteopenia. Soft tissue planes are nonsuspicious. Surgical clips in the abdomen. Inferior vena cava filter seen on the lateral radiograph. IMPRESSION: Stable cardiomegaly with mild pulmonary vascular congestion. RIGHT lung base atelectasis.  Electronically Signed   By:  Elon Alas M.D.   On: 09/15/2015 00:04   Dg Lumbar Spine 2-3 Views  09/15/2015  CLINICAL DATA:  Chronic low back pain.  No known injury. EXAM: LUMBAR SPINE - 2-3 VIEW COMPARISON:  Abdomen and pelvis CT dated 07/15/2015. FINDINGS: Five non-rib-bearing lumbar vertebrae. Previously demonstrated extensive degenerative changes in the lumbar and lower thoracic spine. These include marked facet degenerative changes with associated grade 1 anterolisthesis at the L4-5 level. No pars defects or fractures seen. Diffuse osteopenia. Bilateral hip prostheses. Inferior vena cava filter. Left mid abdominal surgical clips. IMPRESSION: Marked degenerative changes. Electronically Signed   By: Claudie Revering M.D.   On: 09/15/2015 14:54    EKG:   Orders placed or performed during the hospital encounter of 09/14/15  . ED EKG  . ED EKG    ASSESSMENT AND PLAN:   81 year old female with past medical history significant for hypertension, history of DVT, polycythemia vera, arthritis presents to the hospital secondary to significant back pain radiating to right flank.  #1 Back pain-radiating to right flank. Never had history of prior back pain. -Lumbar spine x-ray with significant degenerative disc disease -Gentle hydration, pain medication. -Heating pad for now. Straight leg raising test is negative. No radicular symptoms noted. -Physical therapy consulted - pain improving with pain meds  #2 UTI-continue rocephin- change to keflex at discharge  #3 history of DVT-on warfarin. -INR is therapeutic  #4 elevated troponin-likely demand ischemia. -Appreciate cardiology consult. No further cardiac testing needed.  #5 polycythemia vera-with leukocytosis, chronic erythrocytosis and thrombocytosis. -Follow up with oncology for phlebotomy -Outpatient oncology consult   #6 DVT prophylaxis-on warfarin  Physical therapy consulted, rehab recommended Social work consult placed  All the  records are reviewed and case discussed with Care Management/Social Workerr. Management plans discussed with the patient, family and they are in agreement.  CODE STATUS: Full Code  TOTAL TIME TAKING CARE OF THIS PATIENT: 38 minutes.   POSSIBLE D/C IN 1-2 DAYS, DEPENDING ON CLINICAL CONDITION.   [name] M.D on 09/16/2015 at 12:50 PM  Between 7am to 6pm - Pager - [phone]  After 6pm go to [email or web address] - password [email or web address] [facility]  Office  [phone]  CC: Primary care physician; PROVIDER NOT IN SYSTEM

## 2015-09-16 NOTE — Evaluation (Signed)
Physical Therapy Evaluation Patient Details Name: Yolanda Wells MRN: [id number] DOB: 02-10-1932 Today's Date: 09/16/2015   History of Present Illness  Pt is a pleasant 81 year old female with complaints of R side back pain. All imaging negative at this time. Pt with history or HTN, DVT, and B THR. Pt with colostomy and IVC filter. Pt currently resides at [facility].   Clinical Impression  Pt is a pleasant 81 year old female who was admitted for R side back pain. Pt performs bed mobility with min assist and transfers with max assist and rw. Pt unable to perform sit<>Stand at this time, limiting further mobility secondary to increased B LE weakness. Pt reports receiving PT at [facility] and being able to stand pivot to WC.  Pt demonstrates deficits with strength/mobility/endurance at this time. Would benefit from skilled PT to address above deficits and promote optimal return to PLOF. Recommend transition to STR upon discharge from acute hospitalization.       Follow Up Recommendations SNF    Equipment Recommendations       Recommendations for Other Services       Precautions / Restrictions Precautions Precautions:  (isolation at this time) Restrictions Weight Bearing Restrictions: No      Mobility  Bed Mobility Overal bed mobility: Needs Assistance;+2 for physical assistance             General bed mobility comments: supine->sit with min assist and once sitting able to maintain sitting with supervision. Sit->supine with mod assist with +2 required for scooting up towards [place]. Pt also able to perform rolling to B sides with min assist  Transfers Overall transfer level: Needs assistance               General transfer comment: sit<>Stand attempted x 2 with rw and max assist. Pt unable to raise buttocks off bed. Therapist advised RN that pt would need a lift for further mobility.  Ambulation/Gait             General Gait Details: unable at this time  Stairs             Wheelchair Mobility    Modified Rankin (Stroke Patients Only)       Balance Overall balance assessment: Needs assistance Sitting-balance support: Feet supported Sitting balance-Leahy Scale: Good     Standing balance support:  (unable at this time)                                 Pertinent Vitals/Pain Pain Assessment: No/denies pain    Home Living Family/patient expects to be discharged to::  (back to ILF)                      Prior Function           Comments:  (motorized WC for all mobility, able to stand pivot)     Hand Dominance        Extremity/Trunk Assessment   Upper Extremity Assessment:  (grossly 4/5)           Lower Extremity Assessment:  (L LE grossly 2/5; R LE grossly 3/5)         Communication   Communication: No difficulties  Cognition Arousal/Alertness: Awake/alert Behavior During Therapy: WFL for tasks assessed/performed Overall Cognitive Status: History of cognitive impairments - at baseline       Memory: Decreased short-term memory  General Comments      Exercises Other Exercises Other Exercises: Pt performed supine ther-ex including B LE ankle pumps, quad sets, hip abd/add, and SLRs. All ther-ex performed x 10 reps with min assist on R side and mod assist on L side. Pt also requires cues for correct technique.      Assessment/Plan    PT Assessment Patient needs continued PT services  PT Diagnosis Difficulty walking;Generalized weakness   PT Problem List Decreased strength;Decreased mobility;Decreased activity tolerance  PT Treatment Interventions Gait training;Functional mobility training;Therapeutic exercise   PT Goals (Current goals can be found in the Care Plan section) Acute Rehab PT Goals Patient Stated Goal: to go back to [facility] PT Goal Formulation: With patient Time For Goal Achievement: 09/30/15 Potential to Achieve Goals: Good    Frequency Min  2X/week   Barriers to discharge        Co-evaluation               End of Session Equipment Utilized During Treatment: Gait belt Activity Tolerance: Patient tolerated treatment well Patient left: in bed;with bed alarm set;with nursing/sitter in room Nurse Communication: Mobility status;Need for lift equipment    Functional Assessment Tool Used: clinical judgement Functional Limitation: Mobility: Walking and moving around Mobility: Walking and Moving Around Current Status [phone]): At least 40 percent but less than 60 percent impaired, limited or restricted Mobility: Walking and Moving Around Goal Status [phone]): At least 20 percent but less than 40 percent impaired, limited or restricted    Time: 1129-1155 PT Time Calculation (min) (ACUTE ONLY): 26 min   Charges:   PT Evaluation $Initial PT Evaluation Tier I: 1 Procedure PT Treatments $Therapeutic Exercise: 8-22 mins   PT G Codes:   PT G-Codes **NOT FOR INPATIENT CLASS** Functional Assessment Tool Used: clinical judgement Functional Limitation: Mobility: Walking and moving around Mobility: Walking and Moving Around Current Status (Q2595): At least 40 percent but less than 60 percent impaired, limited or restricted Mobility: Walking and Moving Around Goal Status [phone]): At least 20 percent but less than 40 percent impaired, limited or restricted    Felicha Frayne 09/16/2015, 12:38 PM Greggory Stallion, PT, DPT [phone]

## 2015-09-16 NOTE — Progress Notes (Signed)
Patient run of SVT up to 162 bpm, back to NSR now. Vss, asymptomatic. Dr. Tressia Miners notified [address]

## 2015-09-16 NOTE — Care Management Note (Signed)
Case Management Note  Patient Details  Name: Yolanda Wells MRN: [id number] Date of Birth: [birth year]  Subjective/Objective:  Call to [place] at [facility] who verified that Yolanda Wells is currently an open client of [facility]. A referral to continue home health services RN and PT was faxed and called to .                   Action/Plan:   Expected Discharge Date:                  Expected Discharge Plan:     In-House Referral:     Discharge planning Services     Post Acute Care Choice:    Choice offered to:     DME Arranged:    DME Agency:     HH Arranged:    [employer] Agency:     Status of Service:     Medicare Important Message Given:    Date Medicare IM Given:    Medicare IM give by:    Date Additional Medicare IM Given:    Additional Medicare Important Message give by:     If discussed at [place] of Stay Meetings, dates discussed:    Additional Comments:  Eunice Oldaker A, RN 09/16/2015, 11:59 AM

## 2015-09-16 NOTE — Care Management Note (Signed)
Case Management Note  Patient Details  Name: Nalah Macioce MRN: [id number] Date of Birth: [birth year]  Subjective/Objective:    PT is recommending SNF for wheelchair bound, Observation bed, Ms Athalia Setterlund. Discussed discharge options with Blima Rich, CSW, who stated that Ms Clagg does not meet criteria for SNF. Ms Gallogly resides at an [facility], and her granddaughter provides ADL assistance. A referral was faxed and called to [facility] requesting PT and RN. No active discharge order at this time.                Action/Plan:   Expected Discharge Date:                  Expected Discharge Plan:     In-House Referral:     Discharge planning Services     Post Acute Care Choice:    Choice offered to:     DME Arranged:    DME Agency:     HH Arranged:    [place] Agency:     Status of Service:     Medicare Important Message Given:    Date Medicare IM Given:    Medicare IM give by:    Date Additional Medicare IM Given:    Additional Medicare Important Message give by:     If discussed at [place] of Stay Meetings, dates discussed:    Additional Comments:  Tor Tsuda A, RN 09/16/2015, 2:21 PM

## 2015-09-17 DIAGNOSIS — R7989 Other specified abnormal findings of blood chemistry: Secondary | ICD-10-CM | POA: Diagnosis not present

## 2015-09-17 LAB — PROTIME-INR
INR: 3
PROTHROMBIN TIME: 31.2 s — AB (ref 11.4–15.0)

## 2015-09-17 NOTE — Progress Notes (Signed)
Physical Therapy Treatment Patient Details Name: Yolanda Wells MRN: [id number] DOB: [birth year] Today's Date: 09/17/2015    History of Present Illness Pt is a pleasant 81 year old female with complaints of R side back pain. All imaging negative at this time. Pt with history or HTN, DVT, and B THR. Pt with colostomy and IVC filter. Pt currently resides at [facility].     PT Comments    Pt progressing in her ability to bear weight through bilateral LEs. She was able to stand up, however she is still very weak and unable to stand with stability for any favorable amount of time, let alone shuffle feet for a stand-pivot transfer - which she was performing independently with AD prior to admission. She has improved with her strength but will continue to benefit from skilled PT in order for her to return to optimal PLOF.   Follow Up Recommendations  SNF     Equipment Recommendations       Recommendations for Other Services       Precautions / Restrictions Precautions Precautions: Fall Precaution Comments: Careful of ostomy bag Restrictions Weight Bearing Restrictions: No    Mobility  Bed Mobility Overal bed mobility: Needs Assistance Bed Mobility: Supine to Sit     Supine to sit: Min assist     General bed mobility comments: Pt needs to have tactile cues for movements of LEs toward EOB. She then needs trunk support and the be to be totally flat to get fully out to EOB  Transfers Overall transfer level: Needs assistance Equipment used: Rolling walker (2 wheeled) Transfers: Sit to/from [facility] Sit to Stand: Max assist;From elevated surface Stand pivot transfers: Max assist;+2 physical assistance       General transfer comment: Pt requires max assist for trunk and elevated bed to get up into standing. Once in standing she is fairly stable but after 10 seconds of standing she beings to sway and have minor buckling that is self-correctable. Pt participates  in stand-pivot transfer to recliner marginally.    Ambulation/Gait Ambulation/Gait assistance:  (not appropriate)               Stairs            Wheelchair Mobility    Modified Rankin (Stroke Patients Only)       Balance   Sitting-balance support: Bilateral upper extremity supported Sitting balance-Leahy Scale: Fair     Standing balance support: Bilateral upper extremity supported                        Cognition Arousal/Alertness: Awake/alert Behavior During Therapy: WFL for tasks assessed/performed Overall Cognitive Status: History of cognitive impairments - at baseline       Memory: Decreased short-term memory              Exercises Other Exercises Other Exercises: Pt performed bilateral therex x 12 reps at min A for facilitation of movement. Exercises included: ankle pumps, quad sets, glute sets, SAQ, LAQ, SLR, hip abd    General Comments        Pertinent Vitals/Pain Pain Assessment: No/denies pain    Home Living                      Prior Function            PT Goals (current goals can now be found in the care plan section) Acute Rehab PT Goals Patient Stated Goal:  to go to recliner PT Goal Formulation: With patient Time For Goal Achievement: 09/30/15 Potential to Achieve Goals: Good Progress towards PT goals: Progressing toward goals    Frequency  Min 2X/week    PT Plan Current plan remains appropriate    Co-evaluation             End of Session Equipment Utilized During Treatment: Gait belt Activity Tolerance: Patient tolerated treatment well Patient left: in chair;with call bell/phone within reach;with chair alarm set     Time: 9326-7124 PT Time Calculation (min) (ACUTE ONLY): 24 min  Charges:                       G CodesJanyth Contes 2015/10/03, 1:22 PM  Janyth Contes, SPT. [phone]

## 2015-09-17 NOTE — Discharge Summary (Signed)
[facility] NAME: Yolanda Wells    MR#:  [id number]  DATE OF BIRTH:  February 09, 1932  DATE OF ADMISSION:  09/14/2015 ADMITTING PHYSICIAN: Harrie Foreman, MD  DATE OF DISCHARGE: 09/17/2015  PRIMARY CARE PHYSICIAN: PROVIDER NOT IN SYSTEM    ADMISSION DIAGNOSIS:  Cough [R05] Elevated troponin [R79.89] Aspiration pneumonia of right lower lobe, unspecified aspiration pneumonia type ([place]) [J69.0]  DISCHARGE DIAGNOSIS:  Active Problems:   Elevated troponin   SECONDARY DIAGNOSIS:   Past Medical History  Diagnosis Date  . Hypertension   . Deep venous thrombosis (HCC)     right lower extremity  . History of hysterectomy   . History of bilateral hip replacements   . Polycythemia vera ([place])   . Dependent edema   . Arthritis   . Collagen vascular disease ([place])   . Stroke [facility])     Pt reports she was told she had a stroke in may    HOSPITAL COURSE:   81 year old female with past medical history significant for hypertension, history of DVT, polycythemia vera, arthritis presents to the hospital secondary to significant back pain radiating to right flank.  #1 Back pain-radiating to right flank. Never had history of prior back pain. -Lumbar spine x-ray with significant degenerative disc disease -Improvement with pain medication. -Heating pad for now. Straight leg raising test is negative. No radicular symptoms noted. -Physical therapy consulted-patient will be discharged home with home health.  -patient has limited mobility at baseline, has a wheelchair and a walker at home  #2 History of DVT-on warfarin. -INR is therapeutic  #3 Elevated troponin-likely demand ischemia. -Appreciate cardiology consult. No further cardiac testing needed.  #4 Polycythemia vera-with leukocytosis, chronic erythrocytosis and thrombocytosis. -Follow up with oncology for phlebotomy. Patient follows with Dr. Sterling [facility] from [facility]  will be faxed and recommended to f/u in 1 week  5 UTI-urine looks normal, the patient had symptoms when she came in. -That improved with antibiotics. So will finish off the course with Keflex  Discussed with granddaughter. Patient will be discharged home today  DISCHARGE CONDITIONS:   Stable  CONSULTS OBTAINED:  Treatment Team:  Teodoro Spray, MD  DRUG ALLERGIES:  No Known Allergies  DISCHARGE MEDICATIONS:   Current Discharge Medication List    START taking these medications   Details  aspirin EC 81 MG tablet Take 1 tablet (81 mg total) by mouth daily. Qty: 30 tablet, Refills: 2    cephALEXin (KEFLEX) 500 MG capsule Take 1 capsule (500 mg total) by mouth 2 (two) times daily. Qty: 8 capsule, Refills: 0    mupirocin ointment (BACTROBAN) 2 % Place 1 application into the nose 2 (two) times daily. Qty: 22 g, Refills: 0    oxyCODONE-acetaminophen (PERCOCET/ROXICET) 5-325 MG tablet Take 1 tablet by mouth every 6 (six) hours as needed for severe pain. Qty: 20 tablet, Refills: 0      CONTINUE these medications which have NOT CHANGED   Details  docusate sodium (COLACE) 100 MG capsule Take 100 mg by mouth 2 (two) times daily.    ferrous sulfate 325 (65 FE) MG tablet Take 325 mg by mouth 2 (two) times daily with a meal.    folic acid (FOLVITE) 355 MCG tablet Take 400 mcg by mouth daily.    furosemide (LASIX) 20 MG tablet Take 20 mg by mouth daily.    ondansetron (ZOFRAN) 4 MG tablet Take 4 mg by mouth every 6 (six)  hours as needed for nausea or vomiting.    potassium chloride SA (K-DUR,KLOR-CON) 20 MEQ tablet Take 1 tablet (20 mEq total) by mouth 2 (two) times daily. Qty: 30 tablet, Refills: 0    simethicone (MYLICON) 80 MG chewable tablet Chew 30 mg by mouth 4 (four) times daily -  before meals and at bedtime.    warfarin (COUMADIN) 5 MG tablet Take 1 tablet (5 mg total) by mouth daily at 6 PM. Qty: 20 tablet, Refills: 0      STOP taking these medications      enoxaparin (LOVENOX) 150 MG/ML injection      oxycodone-acetaminophen (PERCOCET) 2.5-325 MG per tablet          DISCHARGE INSTRUCTIONS:   1. PCP follow-up in 1 week 2. Oncology follow-up in 1-2 weeks  If you experience worsening of your admission symptoms, develop shortness of breath, life threatening emergency, suicidal or homicidal thoughts you must seek medical attention immediately by calling 911 or calling your MD immediately  if symptoms less severe.  You Must read complete instructions/literature along with all the possible adverse reactions/side effects for all the Medicines you take and that have been prescribed to you. Take any new Medicines after you have completely understood and accept all the possible adverse reactions/side effects.   Please note  You were cared for by a hospitalist during your hospital stay. If you have any questions about your discharge medications or the care you received while you were in the hospital after you are discharged, you can call the unit and asked to speak with the hospitalist on call if the hospitalist that took care of you is not available. Once you are discharged, your primary care physician will handle any further medical issues. Please note that NO REFILLS for any discharge medications will be authorized once you are discharged, as it is imperative that you return to your primary care physician (or establish a relationship with a primary care physician if you do not have one) for your aftercare needs so that they can reassess your need for medications and monitor your lab values.    Today   CHIEF COMPLAINT:   Chief Complaint  Patient presents with  . Cough    Pt. here via EMS for cough and rt. flank pain.    VITAL SIGNS:  Blood pressure 133/74, pulse 86, temperature 98 F (36.7 C), temperature source Oral, resp. rate 18, height 5\' 7"  (1.702 m), weight 92.398 kg (203 lb 11.2 oz), SpO2 96 %.  I/O:   Intake/Output Summary (Last 24  hours) at 09/17/15 1132 Last data filed at 09/17/15 1115  Gross per 24 hour  Intake 3072.5 ml  Output      0 ml  Net 3072.5 ml    PHYSICAL EXAMINATION:   Physical Exam   GENERAL: 81 y.o.-year-old patient lying in the bed with no acute distress.  EYES: Pupils equal, round, reactive to light and accommodation. No scleral icterus. Extraocular muscles intact.  HEENT: Head atraumatic, normocephalic. Oropharynx and nasopharynx clear.  NECK: Supple, no jugular venous distention. No thyroid enlargement, no tenderness.  LUNGS: Normal breath sounds bilaterally, no wheezing, rales,rhonchi or crepitation. No use of accessory muscles of respiration.  CARDIOVASCULAR: S1, S2 normal. No rubs, or gallops. 3/6 systolic murmur present ABDOMEN: Soft, nontender, nondistended. Bowel sounds present. No organomegaly or mass.  EXTREMITIES: No pedal edema, cyanosis, or clubbing. Straight leg raising test is negative. NEUROLOGIC: Cranial nerves II through XII are intact. Muscle strength 5/5 in  all extremities. Sensation intact. Gait not checked.  PSYCHIATRIC: The patient is alert and oriented x 3.  SKIN: No obvious rash, lesion, or ulcer.   DATA REVIEW:   CBC  Recent Labs Lab 09/16/15 0441  WBC 23.7*  HGB 16.0  HCT 50.6*  PLT 835*    Chemistries   Recent Labs Lab 09/15/15 0003 09/16/15 0441  NA 139 140  K 4.7 4.2  CL 108 110  CO2 26 25  GLUCOSE 110* 80  BUN 11 8  CREATININE 0.81 0.61  CALCIUM 9.1 8.4*  AST 22  --   ALT 10*  --   ALKPHOS 119  --   BILITOT 0.4  --     Cardiac Enzymes  Recent Labs Lab 09/15/15 1544  TROPONINI 0.03    Microbiology Results  Results for orders placed or performed during the hospital encounter of 09/14/15  Culture, blood (routine x 2)     Status: None (Preliminary result)   Collection Time: 09/15/15  2:10 AM  Result Value Ref Range Status   Specimen Description BLOOD  Final   Special Requests BOTTLES DRAWN AEROBIC AND ANAEROBIC 6CC   Final   Culture NO GROWTH 2 DAYS  Final   Report Status PENDING  Incomplete  Culture, blood (routine x 2)     Status: None (Preliminary result)   Collection Time: 09/15/15  2:26 AM  Result Value Ref Range Status   Specimen Description BLOOD  Final   Special Requests BOTTLES DRAWN AEROBIC AND ANAEROBIC 6CC  Final   Culture NO GROWTH 2 DAYS  Final   Report Status PENDING  Incomplete  MRSA PCR Screening     Status: Abnormal   Collection Time: 09/15/15  2:25 PM  Result Value Ref Range Status   MRSA by PCR POSITIVE (A) NEGATIVE Final    Comment:        The GeneXpert MRSA Assay (FDA approved for NASAL specimens only), is one component of a comprehensive MRSA colonization surveillance program. It is not intended to diagnose MRSA infection nor to guide or monitor treatment for MRSA infections. CRITICAL RESULT CALLED TO, READ BACK BY AND VERIFIED WITH: [facility] MANSFIELD RN AT 4196 09/15/2015     RADIOLOGY:  Dg Lumbar Spine 2-3 Views  09/15/2015  CLINICAL DATA:  Chronic low back pain.  No known injury. EXAM: LUMBAR SPINE - 2-3 VIEW COMPARISON:  Abdomen and pelvis CT dated 07/15/2015. FINDINGS: Five non-rib-bearing lumbar vertebrae. Previously demonstrated extensive degenerative changes in the lumbar and lower thoracic spine. These include marked facet degenerative changes with associated grade 1 anterolisthesis at the L4-5 level. No pars defects or fractures seen. Diffuse osteopenia. Bilateral hip prostheses. Inferior vena cava filter. Left mid abdominal surgical clips. IMPRESSION: Marked degenerative changes. Electronically Signed   By: Claudie Revering M.D.   On: 09/15/2015 14:54    EKG:   Orders placed or performed during the hospital encounter of 09/14/15  . ED EKG  . ED EKG      Management plans discussed with the patient, family and they are in agreement.  CODE STATUS:     Code Status Orders        Start     Ordered   09/15/15 0342  Full code   Continuous     09/15/15  0341    Advance Directive Documentation        Most Recent Value   Type of Advance Directive  Healthcare Power of Attorney [granddaughter]   Pre-existing out of facility DNR  order (yellow form or pink MOST form)     "MOST" Form in Place?        TOTAL TIME TAKING CARE OF THIS PATIENT: 38 minutes.    [name] M.D on 09/17/2015 at 11:32 AM  Between 7am to 6pm - Pager - [phone]  After 6pm go to [email or web address] - password [email or web address] [facility]  Office  [phone]  CC: Primary care physician; PROVIDER NOT IN SYSTEM

## 2015-09-17 NOTE — Care Management (Addendum)
Patient admitted under observation for back and flank pain and had elevated troponin.  Patient resides at [facility].  her granddaughter [name] provides support.  She is with the patient all day but [name] works at night (1-8p) and patient is alone.  Patient does have a life alert.  [name] arranges for round the clock supervision on the weekends.  [facility] has [facility] PT OT and aide.  Has a hospital bed provided at last discharge but "was not covered by medicare because Md would not complete the medical necessity.  Discussed with Will from [facility] and provided the medical necessity.  [name] declines need for home health social work.

## 2015-09-17 NOTE — Discharge Instructions (Signed)
Cephalexin tablets or capsules  What is this medicine?  CEPHALEXIN (sef a LEX in) is a cephalosporin antibiotic. It is used to treat certain kinds of bacterial infections It will not work for colds, flu, or other viral infections.  This medicine may be used for other purposes; ask your health care provider or pharmacist if you have questions.  What should I tell my health care provider before I take this medicine?  They need to know if you have any of these conditions:  -kidney disease  -stomach or intestine problems, especially colitis  -an unusual or allergic reaction to cephalexin, other cephalosporins, penicillins, other antibiotics, medicines, foods, dyes or preservatives  -pregnant or trying to get pregnant  -breast-feeding  How should I use this medicine?  Take this medicine by mouth with a full glass of water. Follow the directions on the prescription label. This medicine can be taken with or without food. Take your medicine at regular intervals. Do not take your medicine more often than directed. Take all of your medicine as directed even if you think you are better. Do not skip doses or stop your medicine early.  Talk to your pediatrician regarding the use of this medicine in children. While this drug may be prescribed for selected conditions, precautions do apply.  Overdosage: If you think you have taken too much of this medicine contact a poison control center or emergency room at once.  NOTE: This medicine is only for you. Do not share this medicine with others.  What if I miss a dose?  If you miss a dose, take it as soon as you can. If it is almost time for your next dose, take only that dose. Do not take double or extra doses. There should be at least 4 to 6 hours between doses.  What may interact with this medicine?  -probenecid  -some other antibiotics  This list may not describe all possible interactions. Give your health care provider a list of all the medicines, herbs, non-prescription drugs, or  dietary supplements you use. Also tell them if you smoke, drink alcohol, or use illegal drugs. Some items may interact with your medicine.  What should I watch for while using this medicine?  Tell your doctor or health care professional if your symptoms do not begin to improve in a few days.  Do not treat diarrhea with over the counter products. Contact your doctor if you have diarrhea that lasts more than 2 days or if it is severe and watery.  If you have diabetes, you may get a false-positive result for sugar in your urine. Check with your doctor or health care professional.  What side effects may I notice from receiving this medicine?  Side effects that you should report to your doctor or health care professional as soon as possible:  -allergic reactions like skin rash, itching or hives, swelling of the face, lips, or tongue  -breathing problems  -pain or trouble passing urine  -redness, blistering, peeling or loosening of the skin, including inside the mouth  -severe or watery diarrhea  -unusually weak or tired  -yellowing of the eyes, skin  Side effects that usually do not require medical attention (report to your doctor or health care professional if they continue or are bothersome):  -gas or heartburn  -genital or anal irritation  -headache  -joint or muscle pain  -nausea, vomiting  This list may not describe all possible side effects. Call your doctor for medical advice about side effects.   You may report side effects to FDA at 1-800-FDA-1088.  Where should I keep my medicine?  Keep out of the reach of children.  Store at room temperature between 59 and 86 degrees F (15 and 30 degrees C). Throw away any unused medicine after the expiration date.  NOTE: This sheet is a summary. It may not cover all possible information. If you have questions about this medicine, talk to your doctor, pharmacist, or health care provider.     © 2016, [employer]. (2008-01-31 17:09:13)

## 2015-09-17 NOTE — Care Management (Signed)
Hospital Bed   Patient suffers pulmonary embolus , previous cva and arthritis and has trouble breathing at night when head is elevated less 30 degrees degrees. Bed wedges do not provide enough elevation to resolve breathing issues.  Dyspnea  cause patient to require frequent changes in body position which cannot be achieved with a normal bed.  Patient also has history of previous stroke and chronic arthritis pain with functional deficits which requires to be positioned in ways to alleviate pain and promote independence in bed mobilitynot feasible with a normal bed.

## 2015-09-17 NOTE — Progress Notes (Signed)
[facility]  Patient Status: active  [facility] is providing the following services: SN/PT/OT/HHA  If patient discharges after hours, please call [phone].   Yolanda Wells 09/17/2015, 10:43 AM

## 2015-09-17 NOTE — Progress Notes (Addendum)
Pt. Discharged to [facility] [place] via EMS. Discharge instructions and medication regimen reviewed at bedside with patient. Pt. verbalizes understanding of instructions and medication regimen. Prescriptions given to patient. Patient assessment unchanged from this morning. EMS notified and on their way. Granddaughter, [name], updated and aware pt is ready for transfer.  Telemetry and iv removed per protocol.

## 2015-09-21 LAB — CULTURE, BLOOD (ROUTINE X 2)
CULTURE: NO GROWTH
Culture: NO GROWTH

## 2015-09-22 ENCOUNTER — Observation Stay
Admission: EM | Admit: 2015-09-22 | Discharge: 2015-09-25 | Disposition: A | Discharge status: Home / Self Care | Payer: Medicare Other | Attending: Internal Medicine | Admitting: Internal Medicine

## 2015-09-22 ENCOUNTER — Encounter: Payer: Self-pay | Admitting: Emergency Medicine

## 2015-09-22 ENCOUNTER — Emergency Department: Payer: Medicare Other

## 2015-09-22 DIAGNOSIS — D72829 Elevated white blood cell count, unspecified: Secondary | ICD-10-CM

## 2015-09-22 DIAGNOSIS — Z7982 Long term (current) use of aspirin: Secondary | ICD-10-CM | POA: Diagnosis not present

## 2015-09-22 DIAGNOSIS — Z86718 Personal history of other venous thrombosis and embolism: Secondary | ICD-10-CM | POA: Insufficient documentation

## 2015-09-22 DIAGNOSIS — R0602 Shortness of breath: Secondary | ICD-10-CM | POA: Diagnosis not present

## 2015-09-22 DIAGNOSIS — E042 Nontoxic multinodular goiter: Secondary | ICD-10-CM | POA: Insufficient documentation

## 2015-09-22 DIAGNOSIS — Z23 Encounter for immunization: Secondary | ICD-10-CM | POA: Insufficient documentation

## 2015-09-22 DIAGNOSIS — Z8249 Family history of ischemic heart disease and other diseases of the circulatory system: Secondary | ICD-10-CM | POA: Diagnosis not present

## 2015-09-22 DIAGNOSIS — M359 Systemic involvement of connective tissue, unspecified: Secondary | ICD-10-CM | POA: Diagnosis not present

## 2015-09-22 DIAGNOSIS — Z9049 Acquired absence of other specified parts of digestive tract: Secondary | ICD-10-CM | POA: Insufficient documentation

## 2015-09-22 DIAGNOSIS — R748 Abnormal levels of other serum enzymes: Secondary | ICD-10-CM | POA: Diagnosis not present

## 2015-09-22 DIAGNOSIS — Z825 Family history of asthma and other chronic lower respiratory diseases: Secondary | ICD-10-CM | POA: Insufficient documentation

## 2015-09-22 DIAGNOSIS — Z96643 Presence of artificial hip joint, bilateral: Secondary | ICD-10-CM | POA: Insufficient documentation

## 2015-09-22 DIAGNOSIS — Z7901 Long term (current) use of anticoagulants: Secondary | ICD-10-CM | POA: Insufficient documentation

## 2015-09-22 DIAGNOSIS — D45 Polycythemia vera: Secondary | ICD-10-CM | POA: Insufficient documentation

## 2015-09-22 DIAGNOSIS — R600 Localized edema: Secondary | ICD-10-CM | POA: Insufficient documentation

## 2015-09-22 DIAGNOSIS — Z8673 Personal history of transient ischemic attack (TIA), and cerebral infarction without residual deficits: Secondary | ICD-10-CM | POA: Insufficient documentation

## 2015-09-22 DIAGNOSIS — J441 Chronic obstructive pulmonary disease with (acute) exacerbation: Principal | ICD-10-CM

## 2015-09-22 DIAGNOSIS — J9811 Atelectasis: Secondary | ICD-10-CM | POA: Diagnosis not present

## 2015-09-22 DIAGNOSIS — Z86711 Personal history of pulmonary embolism: Secondary | ICD-10-CM | POA: Insufficient documentation

## 2015-09-22 DIAGNOSIS — Z90710 Acquired absence of both cervix and uterus: Secondary | ICD-10-CM | POA: Diagnosis not present

## 2015-09-22 DIAGNOSIS — R079 Chest pain, unspecified: Secondary | ICD-10-CM | POA: Diagnosis not present

## 2015-09-22 DIAGNOSIS — Z833 Family history of diabetes mellitus: Secondary | ICD-10-CM | POA: Diagnosis not present

## 2015-09-22 DIAGNOSIS — Z808 Family history of malignant neoplasm of other organs or systems: Secondary | ICD-10-CM | POA: Insufficient documentation

## 2015-09-22 DIAGNOSIS — R05 Cough: Secondary | ICD-10-CM | POA: Diagnosis not present

## 2015-09-22 DIAGNOSIS — I700 Atherosclerosis of aorta: Secondary | ICD-10-CM | POA: Diagnosis not present

## 2015-09-22 DIAGNOSIS — I10 Essential (primary) hypertension: Secondary | ICD-10-CM | POA: Diagnosis not present

## 2015-09-22 DIAGNOSIS — R6883 Chills (without fever): Secondary | ICD-10-CM | POA: Insufficient documentation

## 2015-09-22 DIAGNOSIS — R509 Fever, unspecified: Secondary | ICD-10-CM | POA: Diagnosis not present

## 2015-09-22 DIAGNOSIS — Z89421 Acquired absence of other right toe(s): Secondary | ICD-10-CM | POA: Diagnosis not present

## 2015-09-22 DIAGNOSIS — J189 Pneumonia, unspecified organism: Secondary | ICD-10-CM | POA: Diagnosis not present

## 2015-09-22 DIAGNOSIS — R0609 Other forms of dyspnea: Secondary | ICD-10-CM | POA: Insufficient documentation

## 2015-09-22 DIAGNOSIS — Z8261 Family history of arthritis: Secondary | ICD-10-CM | POA: Diagnosis not present

## 2015-09-22 DIAGNOSIS — R531 Weakness: Secondary | ICD-10-CM | POA: Diagnosis not present

## 2015-09-22 DIAGNOSIS — R0600 Dyspnea, unspecified: Secondary | ICD-10-CM | POA: Diagnosis present

## 2015-09-22 LAB — URINALYSIS COMPLETE WITH MICROSCOPIC (ARMC ONLY)
Bilirubin Urine: NEGATIVE
Glucose, UA: NEGATIVE mg/dL
HGB URINE DIPSTICK: NEGATIVE
KETONES UR: NEGATIVE mg/dL
LEUKOCYTES UA: NEGATIVE
Nitrite: NEGATIVE
PH: 5 (ref 5.0–8.0)
PROTEIN: NEGATIVE mg/dL
RBC / HPF: NONE SEEN RBC/hpf (ref 0–5)
Specific Gravity, Urine: 1.005 (ref 1.005–1.030)

## 2015-09-22 LAB — COMPREHENSIVE METABOLIC PANEL
ALT: 11 U/L — ABNORMAL LOW (ref 14–54)
ANION GAP: 5 (ref 5–15)
AST: 18 U/L (ref 15–41)
Albumin: 3 g/dL — ABNORMAL LOW (ref 3.5–5.0)
Alkaline Phosphatase: 112 U/L (ref 38–126)
BILIRUBIN TOTAL: 0.8 mg/dL (ref 0.3–1.2)
BUN: 9 mg/dL (ref 6–20)
CO2: 29 mmol/L (ref 22–32)
Calcium: 8.9 mg/dL (ref 8.9–10.3)
Chloride: 104 mmol/L (ref 101–111)
Creatinine, Ser: 0.67 mg/dL (ref 0.44–1.00)
GFR calc Af Amer: >60 mL/min (ref >60)
Glucose, Bld: 126 mg/dL — ABNORMAL HIGH (ref 65–99)
POTASSIUM: 3.7 mmol/L (ref 3.5–5.1)
Sodium: 138 mmol/L (ref 135–145)
TOTAL PROTEIN: 7.4 g/dL (ref 6.5–8.1)

## 2015-09-22 LAB — CBC
HEMATOCRIT: 55.4 % — AB (ref 35.0–47.0)
Hemoglobin: 17 g/dL — ABNORMAL HIGH (ref 12.0–16.0)
MCH: 24.9 pg — ABNORMAL LOW (ref 26.0–34.0)
MCHC: 30.7 g/dL — AB (ref 32.0–36.0)
MCV: 81.1 fL (ref 80.0–100.0)
PLATELETS: 1286 10*3/uL — AB (ref 150–440)
RBC: 6.83 MIL/uL — ABNORMAL HIGH (ref 3.80–5.20)
RDW: 21.9 % — AB (ref 11.5–14.5)
WBC: 27.7 10*3/uL — ABNORMAL HIGH (ref 3.6–11.0)

## 2015-09-22 LAB — TROPONIN I
TROPONIN I: 0.05 ng/mL — AB (ref <0.031)
TROPONIN I: 0.05 ng/mL — AB (ref <0.031)

## 2015-09-22 LAB — BRAIN NATRIURETIC PEPTIDE: B NATRIURETIC PEPTIDE 5: 100 pg/mL (ref 0.0–100.0)

## 2015-09-22 LAB — PROTIME-INR
INR: 2.74
Prothrombin Time: 28.6 seconds — ABNORMAL HIGH (ref 11.4–15.0)

## 2015-09-22 MED ORDER — ASPIRIN EC 81 MG PO TBEC
81.0000 mg | DELAYED_RELEASE_TABLET | Freq: Every day | ORAL | Status: DC
  Administered 2015-09-23 – 2015-09-25 (×3): 81 mg via ORAL
  Filled 2015-09-22 (×4): qty 1

## 2015-09-22 MED ORDER — SIMETHICONE 80 MG PO CHEW
40.0000 mg | CHEWABLE_TABLET | Freq: Three times a day (TID) | ORAL | Status: DC
  Administered 2015-09-22 – 2015-09-25 (×11): 40 mg via ORAL
  Filled 2015-09-22 (×11): qty 1

## 2015-09-22 MED ORDER — INFLUENZA VAC SPLIT QUAD 0.5 ML IM SUSY
0.5000 mL | PREFILLED_SYRINGE | INTRAMUSCULAR | Status: AC
  Administered 2015-09-25: 10:00:00 0.5 mL via INTRAMUSCULAR
  Filled 2015-09-22: qty 0.5

## 2015-09-22 MED ORDER — FERROUS SULFATE 325 (65 FE) MG PO TABS
325.0000 mg | ORAL_TABLET | Freq: Two times a day (BID) | ORAL | Status: DC
  Administered 2015-09-25: 325 mg via ORAL
  Filled 2015-09-22 (×3): qty 1

## 2015-09-22 MED ORDER — ACETAMINOPHEN 325 MG PO TABS: 650.0000 mg | ORAL_TABLET | Freq: Four times a day (QID) | ORAL | Status: DC | PRN

## 2015-09-22 MED ORDER — ONDANSETRON HCL 4 MG PO TABS: 4.0000 mg | ORAL_TABLET | Freq: Four times a day (QID) | ORAL | Status: DC | PRN

## 2015-09-22 MED ORDER — DOCUSATE SODIUM 100 MG PO CAPS
100.0000 mg | ORAL_CAPSULE | Freq: Two times a day (BID) | ORAL | Status: DC
  Administered 2015-09-22 – 2015-09-25 (×6): 100 mg via ORAL
  Filled 2015-09-22 (×6): qty 1

## 2015-09-22 MED ORDER — ONDANSETRON HCL 4 MG/2ML IJ SOLN: 4.0000 mg | Freq: Four times a day (QID) | INTRAMUSCULAR | Status: DC | PRN

## 2015-09-22 MED ORDER — OXYCODONE-ACETAMINOPHEN 5-325 MG PO TABS: 1.0000 | ORAL_TABLET | Freq: Four times a day (QID) | ORAL | Status: DC | PRN

## 2015-09-22 MED ORDER — ALBUTEROL SULFATE (2.5 MG/3ML) 0.083% IN NEBU: 2.5000 mg | INHALATION_SOLUTION | RESPIRATORY_TRACT | Status: DC | PRN

## 2015-09-22 MED ORDER — WARFARIN - PHARMACIST DOSING INPATIENT
Freq: Every day | Status: DC
  Administered 2015-09-24: 18:00:00

## 2015-09-22 MED ORDER — FUROSEMIDE 20 MG PO TABS
20.0000 mg | ORAL_TABLET | Freq: Every day | ORAL | Status: DC
  Administered 2015-09-23 – 2015-09-25 (×3): 20 mg via ORAL
  Filled 2015-09-22 (×3): qty 1

## 2015-09-22 MED ORDER — MUPIROCIN 2 % EX OINT
1.0000 | TOPICAL_OINTMENT | Freq: Two times a day (BID) | CUTANEOUS | Status: DC
Start: 2015-09-22 — End: 2015-09-25
  Administered 2015-09-22 – 2015-09-25 (×6): 1 via NASAL
  Filled 2015-09-22: qty 22

## 2015-09-22 MED ORDER — POTASSIUM CHLORIDE CRYS ER 20 MEQ PO TBCR
20.0000 meq | EXTENDED_RELEASE_TABLET | Freq: Two times a day (BID) | ORAL | Status: DC
  Administered 2015-09-22 – 2015-09-25 (×6): 20 meq via ORAL
  Filled 2015-09-22 (×6): qty 1

## 2015-09-22 MED ORDER — ACETAMINOPHEN 650 MG RE SUPP
650.0000 mg | Freq: Four times a day (QID) | RECTAL | Status: DC | PRN
Start: 2015-09-22 — End: 2015-09-25

## 2015-09-22 MED ORDER — WARFARIN SODIUM 5 MG PO TABS
5.0000 mg | ORAL_TABLET | Freq: Once | ORAL | Status: AC
  Administered 2015-09-22: 5 mg via ORAL
  Filled 2015-09-22: qty 1

## 2015-09-22 MED ORDER — FOLIC ACID 1 MG PO TABS
1000.0000 ug | ORAL_TABLET | Freq: Every day | ORAL | Status: DC
  Administered 2015-09-23 – 2015-09-25 (×3): 1 mg via ORAL
  Filled 2015-09-22 (×3): qty 1

## 2015-09-22 NOTE — H&P (Signed)
[facility] NAME: Yolanda Wells    MR#:  [id number]  DATE OF BIRTH:  [birth year]  DATE OF ADMISSION:  09/22/2015  PRIMARY CARE PHYSICIAN: PROVIDER NOT IN SYSTEM   REQUESTING/REFERRING PHYSICIAN: Delman Kitten, MD  CHIEF COMPLAINT:   Chief Complaint  Patient presents with  . Shortness of Breath   shortness of breath for 3 days  HISTORY OF PRESENT ILLNESS:  Yolanda Wells  is a 81 y.o. female with a known history of hypertension, DVT, polycythemia and recent pneumonia. The patient was just discharged from our hospital to home after treatment of pneumonia. The patient has had the shortness of breath and dry cough for 3 days. She also complains of cough and chills. She denies any chest pain, palpitation or orthopnea or nocturnal dyspnea has leg edema which is new according to patient's granddaughter. Her temperature is 100.5 and troponin level is elevated at 0.05.  PAST MEDICAL HISTORY:   Past Medical History  Diagnosis Date  . Hypertension   . Deep venous thrombosis (HCC)     right lower extremity  . History of hysterectomy   . History of bilateral hip replacements   . Polycythemia vera ([place])   . Dependent edema   . Arthritis   . Collagen vascular disease ([place])   . Stroke [facility])     Pt reports she was told she had a stroke in may    PAST SURGICAL HISTORY:   Past Surgical History  Procedure Laterality Date  . Toe amputation      right  . Joint replacement      Left and Right Hip  . Abdominal hysterectomy    . Peripheral vascular catheterization N/A 04/02/2015    Procedure: Visceral Angiography;  Surgeon: Algernon Huxley, MD;  Location: [place] [facility];  Service: Cardiovascular;  Laterality: N/A;  . Peripheral vascular catheterization N/A 04/02/2015    Procedure: Visceral Artery Intervention;  Surgeon: Algernon Huxley, MD;  Location: [place] [facility];  Service: Cardiovascular;  Laterality: N/A;  . Colectomy with  colostomy creation/hartmann procedure N/A 04/03/2015    Procedure: COLECTOMY WITH COLOSTOMY CREATION/HARTMANN PROCEDURE;  Surgeon: Sherri Rad, MD;  Location: [facility] ORS;  Service: General;  Laterality: N/A;  . Cardiac catheterization Right 04/03/2015    Procedure: CENTRAL LINE INSERTION;  Surgeon: Sherri Rad, MD;  Location: [facility] ORS;  Service: General;  Laterality: Right;  . Peripheral vascular catheterization N/A 05/25/2015    Procedure: IVC Filter Insertion;  Surgeon: Katha Cabal, MD;  Location: [place] [facility];  Service: Cardiovascular;  Laterality: N/A;    SOCIAL HISTORY:   Social History  Substance Use Topics  . Smoking status: Never Smoker   . Smokeless tobacco: Never Used  . Alcohol Use: No    FAMILY HISTORY:   Family History  Problem Relation Age of Onset  . Heart attack Father   . Hypertension    . Arthritis-Osteo    . COPD Father   . Diabetes Brother   . Osteosarcoma Son     DRUG ALLERGIES:  No Known Allergies  REVIEW OF SYSTEMS:  CONSTITUTIONAL: Has fever and generalized weakness.  EYES: No blurred or double vision.  EARS, NOSE, AND THROAT: No tinnitus or ear pain.  RESPIRATORY: Has dry cough and shortness of breath, no wheezing or hemoptysis.  CARDIOVASCULAR: No chest pain, orthopnea, edema.  GASTROINTESTINAL: No nausea, vomiting, diarrhea or abdominal pain.  GENITOURINARY: No dysuria, hematuria.  ENDOCRINE: No  polyuria, nocturia,  HEMATOLOGY: No anemia, easy bruising or bleeding SKIN: No rash or lesion. MUSCULOSKELETAL: No joint pain or arthritis.   NEUROLOGIC: No tingling, numbness, weakness.  PSYCHIATRY: No anxiety or depression.   MEDICATIONS AT HOME:   Prior to Admission medications   Medication Sig Start Date End Date Taking? Authorizing Provider  aspirin EC 81 MG tablet Take 1 tablet (81 mg total) by mouth daily. 09/16/15   [name], MD  cephALEXin (KEFLEX) 500 MG capsule Take 1 capsule (500 mg total) by mouth 2 (two) times daily.  09/16/15   [name], MD  docusate sodium (COLACE) 100 MG capsule Take 100 mg by mouth 2 (two) times daily.    Historical Provider, MD  ferrous sulfate 325 (65 FE) MG tablet Take 325 mg by mouth 2 (two) times daily with a meal.    Historical Provider, MD  folic acid (FOLVITE) A999333 MCG tablet Take 400 mcg by mouth daily.    Historical Provider, MD  furosemide (LASIX) 20 MG tablet Take 20 mg by mouth daily.    Historical Provider, MD  mupirocin ointment (BACTROBAN) 2 % Place 1 application into the nose 2 (two) times daily. 09/16/15   [name], MD  ondansetron (ZOFRAN) 4 MG tablet Take 4 mg by mouth every 6 (six) hours as needed for nausea or vomiting.    Historical Provider, MD  oxyCODONE-acetaminophen (PERCOCET/ROXICET) 5-325 MG tablet Take 1 tablet by mouth every 6 (six) hours as needed for severe pain. 09/16/15   [name], MD  potassium chloride SA (K-DUR,KLOR-CON) 20 MEQ tablet Take 1 tablet (20 mEq total) by mouth 2 (two) times daily. 07/20/15   Vaughan Basta, MD  simethicone (MYLICON) 80 MG chewable tablet Chew 30 mg by mouth 4 (four) times daily -  before meals and at bedtime.    Historical Provider, MD  warfarin (COUMADIN) 5 MG tablet Take 1 tablet (5 mg total) by mouth daily at 6 PM. 07/20/15   Vaughan Basta, MD      VITAL SIGNS:  Blood pressure 130/83, pulse 87, temperature 97.8 F (36.6 C), temperature source Oral, resp. rate 28, height 5\' 5"  (1.651 m), weight 92.08 kg (203 lb), SpO2 96 %.  PHYSICAL EXAMINATION:  GENERAL:  81 y.o.-year-old patient lying in the bed with no acute distress. Obese. EYES: Pupils equal, round, reactive to light and accommodation. No scleral icterus. Extraocular muscles intact.  HEENT: Head atraumatic, normocephalic. Oropharynx and nasopharynx clear.  NECK:  Supple, no jugular venous distention. No thyroid enlargement, no tenderness.  LUNGS: Normal breath sounds bilaterally, no wheezing, rales,rhonchi or crepitation. No use  of accessory muscles of respiration.  CARDIOVASCULAR: S1, S2 normal. No murmurs, rubs, or gallops.  ABDOMEN: Soft, nontender, nondistended. Bowel sounds present. No organomegaly or mass.  EXTREMITIES: Bilateral lower extremity trace edema, no cyanosis, or clubbing.  NEUROLOGIC: Cranial nerves II through XII are intact. Muscle strength 3-4/5 in all extremities. Sensation intact. Gait not checked.  PSYCHIATRIC: The patient is alert and oriented x 3.  SKIN: No obvious rash, lesion, or ulcer.   LABORATORY PANEL:   CBC  Recent Labs Lab 09/22/15 1531  WBC 27.7*  HGB 17.0*  HCT 55.4*  PLT 1286*   ------------------------------------------------------------------------------------------------------------------  Chemistries   Recent Labs Lab 09/22/15 1531  NA 138  K 3.7  CL 104  CO2 29  GLUCOSE 126*  BUN 9  CREATININE 0.67  CALCIUM 8.9  AST 18  ALT 11*  ALKPHOS 112  BILITOT 0.8   ------------------------------------------------------------------------------------------------------------------  Cardiac Enzymes  Recent Labs Lab 09/22/15 1531  TROPONINI 0.05*   ------------------------------------------------------------------------------------------------------------------  RADIOLOGY:  Dg Chest 2 View  09/22/2015  CLINICAL DATA:  Shortness of breath, fever and burning with urination. EXAM: CHEST  2 VIEW COMPARISON:  09/14/2015, 07/15/2015 as well as chest CT 07/16/2015 and 09/03/2015 FINDINGS: The lungs are adequately inflated with continued elevation the right hemidiaphragm. Mild opacification over the posterior right base unchanged likely atelectasis. No significant effusion. Left lung is clear. Mild stable cardiomegaly. Mild calcified plaque over the thoracic aorta. There are degenerative changes of the spine and shoulders. IMPRESSION: Stable right base atelectasis. Mild stable cardiomegaly. Electronically Signed   By: Marin Olp M.D.   On: 09/22/2015 16:34    EKG:    Orders placed or performed during the hospital encounter of 09/14/15  . ED EKG  . ED EKG    IMPRESSION AND PLAN:   Dyspnea Elevated troponin, possible due to demanding ischemia. Worsening polycythemia Hypertension History of DVT on Coumadin  The patient will be placed for observation. I will continue oxygen by nasal cannular give DuoNeb when necessary. Follow-up troponin and continue aspirin. Follow-up CBC and oncology consult for possible phlebotomy. Continue hypertension medication including Lasix. Continue Coumadin dose per pharmacy and the follow-up INR.  All the records are reviewed and case discussed with ED provider. Management plans discussed with the patient, her granddaughter who is her POA and they are in agreement.  CODE STATUS: Full code  TOTAL TIME TAKING CARE OF THIS PATIENT: 46 minutes.    Demetrios Loll M.D on 09/22/2015 at 5:13 PM  Between 7am to 6pm - Pager - [phone]  After 6pm go to [email or web address] - password [email or web address]  Office  [phone]  CC: Primary care physician; PROVIDER NOT IN SYSTEM

## 2015-09-22 NOTE — Progress Notes (Signed)
Pt came into ED with dyspnea, alert and oriented, lives at [facility] with granddaughter, on room air, vital signs stable, denies pain, has colostomy, incontinent of urine, poorly managed polycythemia vera, seen in hospital on 11/2 for demand ischemia, oncology consult pending, uneventful shift.

## 2015-09-22 NOTE — ED Provider Notes (Signed)
[facility] Emergency Department Provider Note REMINDER - THIS NOTE IS NOT A FINAL MEDICAL RECORD UNTIL IT IS SIGNED. UNTIL THEN, THE CONTENT BELOW MAY REFLECT INFORMATION FROM A DOCUMENTATION TEMPLATE, NOT THE ACTUAL PATIENT VISIT. ____________________________________________  Time seen: Approximately 2:52 PM  I have reviewed the triage vital signs and the nursing notes.   HISTORY  Chief Complaint Shortness of Breath   HPI Yolanda Wells is a 81 y.o. female presents today for shortness of breath, and increased burning with urination. Patient reports that she's had shortness of breath for at least a week, and she also reports that she was admitted and had this similar symptom but continues to have some mild shortness of breath and a dry cough. She also reports a slight scratchy throat. Also been having some burning with urination, but states she was recently treated with antibiotics for urinary tract infection in the hospital.  She denies any chest pain. He has a chronic history of some back and flank pain on the right, but does not endorse any concerns without at present time. No nausea or vomiting. No fevers or chills that she is aware of, though EMS did report a fever of 100.5 orally.  She has a history of pulmonary embolism and DVT, but is also on Coumadin and evidently per previous notes as an IVC filter.   Past Medical History  Diagnosis Date  . Hypertension   . Deep venous thrombosis (HCC)     right lower extremity  . History of hysterectomy   . History of bilateral hip replacements   . Polycythemia vera ([place])   . Dependent edema   . Arthritis   . Collagen vascular disease ([place])   . Stroke [facility])     Pt reports she was told she had a stroke in may    Patient Active Problem List   Diagnosis Date Noted  . Elevated troponin 09/15/2015  . UTI (lower urinary tract infection) 07/16/2015  . Abdominal wall cellulitis 07/15/2015  . DVT (deep venous  thrombosis) ([place]) 07/15/2015  . HTN (hypertension) 07/15/2015  . Polycythemia vera ([place]) 07/15/2015  . Dependent edema 07/15/2015  . Arthritis 07/15/2015  . Pulmonary emboli ([place]) 05/25/2015  . Diverticulosis of colon with hemorrhage 04/02/2015    Past Surgical History  Procedure Laterality Date  . Toe amputation      right  . Joint replacement      Left and Right Hip  . Abdominal hysterectomy    . Peripheral vascular catheterization N/A 04/02/2015    Procedure: Visceral Angiography;  Surgeon: Algernon Huxley, MD;  Location: [place] [facility] LAB;  Service: Cardiovascular;  Laterality: N/A;  . Peripheral vascular catheterization N/A 04/02/2015    Procedure: Visceral Artery Intervention;  Surgeon: Algernon Huxley, MD;  Location: [place] CV LAB;  Service: Cardiovascular;  Laterality: N/A;  . Colectomy with colostomy creation/hartmann procedure N/A 04/03/2015    Procedure: COLECTOMY WITH COLOSTOMY CREATION/HARTMANN PROCEDURE;  Surgeon: Sherri Rad, MD;  Location: [facility] ORS;  Service: General;  Laterality: N/A;  . Cardiac catheterization Right 04/03/2015    Procedure: CENTRAL LINE INSERTION;  Surgeon: Sherri Rad, MD;  Location: [facility] ORS;  Service: General;  Laterality: Right;  . Peripheral vascular catheterization N/A 05/25/2015    Procedure: IVC Filter Insertion;  Surgeon: Katha Cabal, MD;  Location: [place] CV LAB;  Service: Cardiovascular;  Laterality: N/A;    Current Outpatient Rx  Name  Route  Sig  Dispense  Refill  . aspirin  EC 81 MG tablet   Oral   Take 1 tablet (81 mg total) by mouth daily.   30 tablet   2   . cephALEXin (KEFLEX) 500 MG capsule   Oral   Take 1 capsule (500 mg total) by mouth 2 (two) times daily.   8 capsule   0   . docusate sodium (COLACE) 100 MG capsule   Oral   Take 100 mg by mouth 2 (two) times daily.         . ferrous sulfate 325 (65 FE) MG tablet   Oral   Take 325 mg by mouth 2 (two) times daily with a meal.         . folic acid  (FOLVITE) A999333 MCG tablet   Oral   Take 400 mcg by mouth daily.         . furosemide (LASIX) 20 MG tablet   Oral   Take 20 mg by mouth daily.         . mupirocin ointment (BACTROBAN) 2 %   Nasal   Place 1 application into the nose 2 (two) times daily.   22 g   0   . ondansetron (ZOFRAN) 4 MG tablet   Oral   Take 4 mg by mouth every 6 (six) hours as needed for nausea or vomiting.         [name] oxyCODONE-acetaminophen (PERCOCET/ROXICET) 5-325 MG tablet   Oral   Take 1 tablet by mouth every 6 (six) hours as needed for severe pain.   20 tablet   0   . potassium chloride SA (K-DUR,KLOR-CON) 20 MEQ tablet   Oral   Take 1 tablet (20 mEq total) by mouth 2 (two) times daily.   30 tablet   0   . simethicone (MYLICON) 80 MG chewable tablet   Oral   Chew 30 mg by mouth 4 (four) times daily -  before meals and at bedtime.         [name] warfarin (COUMADIN) 5 MG tablet   Oral   Take 1 tablet (5 mg total) by mouth daily at 6 PM.   20 tablet   0     Allergies Review of patient's allergies indicates no known allergies.  Family History  Problem Relation Age of Onset  . Heart attack Father   . Hypertension    . Arthritis-Osteo    . COPD Father   . Diabetes Brother   . Osteosarcoma Son     Social History Social History  Substance Use Topics  . Smoking status: Never Smoker   . Smokeless tobacco: Never Used  . Alcohol Use: No    Review of Systems Constitutional: No fever/chills Eyes: No visual changes. ENT: No sore throat slightly scratchy. Cardiovascular: Denies chest pain. Respiratory: Mild feeling of shortness of breath. Gastrointestinal: No abdominal pain.  No nausea, no vomiting.  No diarrhea.  No constipation. Genitourinary: Positive for dysuria. Musculoskeletal: Negative for back pain. Skin: Negative for rash. Neurological: Negative for headaches, focal weakness or numbness.  10-point ROS otherwise  negative.  ____________________________________________   PHYSICAL EXAM:  VITAL SIGNS: ED Triage Vitals  Enc Vitals Group     BP 09/22/15 1434 143/80 mmHg     Pulse Rate 09/22/15 1434 97     Resp --      Temp 09/22/15 1434 97.8 F (36.6 C)     Temp Source 09/22/15 1434 Oral     SpO2 09/22/15 1434 97 %     Weight  09/22/15 1434 203 lb (92.08 kg)     Height 09/22/15 1434 5\' 5"  (1.651 m)     Head Cir --      Peak Flow --      Pain Score 09/22/15 1436 0     Pain Loc --      Pain Edu? --      Excl. in [place]? --    Constitutional: Alert and oriented. Well appearing and in no acute distress. Eyes: Conjunctivae are normal. PERRL. EOMI. Head: Atraumatic. Nose: No congestion/rhinnorhea. Mouth/Throat: Mucous membranes are moist.  Oropharynx non-erythematous. Neck: No stridor.   Cardiovascular: Normal rate, regular rhythm. Grossly normal heart sounds.  Good peripheral circulation. Respiratory: Normal respiratory effort.  No retractions. Lungs CTAB for some very slight and faint crackles in the right lung base. Gastrointestinal: Soft and nontender. No distention. No abdominal bruits. No CVA tenderness. Musculoskeletal: Right toe amputation old, otherwise normal along the right lower extremity. Left lower extremity good range of motion without erythema redness or evidence of concern/infection. No joint effusions. Neurologic:  Normal speech and language. No gross focal neurologic deficits are appreciated. Skin:  Skin is warm, dry and intact. No rash noted. Psychiatric: Mood and affect are normal. Speech and behavior are normal.  ____________________________________________   LABS (all labs ordered are listed, but only abnormal results are displayed)  Labs Reviewed  URINALYSIS COMPLETEWITH MICROSCOPIC ([facility] ONLY) - Abnormal; Notable for the following:    Color, Urine STRAW (*)    APPearance CLEAR (*)    Bacteria, UA RARE (*)    Squamous Epithelial / LPF 0-5 (*)    All other components  within normal limits  PROTIME-INR - Abnormal; Notable for the following:    Prothrombin Time 28.6 (*)    All other components within normal limits  CBC - Abnormal; Notable for the following:    WBC 27.7 (*)    RBC 6.83 (*)    Hemoglobin 17.0 (*)    HCT 55.4 (*)    MCH 24.9 (*)    MCHC 30.7 (*)    RDW 21.9 (*)    Platelets 1286 (*)    All other components within normal limits  COMPREHENSIVE METABOLIC PANEL - Abnormal; Notable for the following:    Glucose, Bld 126 (*)    Albumin 3.0 (*)    ALT 11 (*)    All other components within normal limits  TROPONIN I - Abnormal; Notable for the following:    Troponin I 0.05 (*)    All other components within normal limits  BRAIN NATRIURETIC PEPTIDE   ____________________________________________  EKG  Reviewed and interpreted by me EKG time 1440 Normal sinus rhythm, heart rate 94  Probable left ventricular hypertrophy Bundle-branch block Q waves noted in inferolateral distribution QTc 460    Previous EKG from 05/30/2015 no significant changes noted  ____________________________________________  RADIOLOGY  Pending at sign out ____________________________________________   PROCEDURES  Procedure(s) performed: None  Critical Care performed: No  ____________________________________________   INITIAL IMPRESSION / ASSESSMENT AND PLAN / ED COURSE  Pertinent labs & imaging results that were available during my care of the patient were reviewed by me and considered in my medical decision making (see chart for details).  Patient presents for dyspnea. She does note that this is been an ongoing process, and per the history she gives it appears she may have had the symptoms when she left the hospital. She is quite stable and has a normal oxygen saturation. The possibility of pulmonary embolism  seems very remote given a report of a IVC filter and being on Coumadin with active treatment. She has no pleuritic chest pain, she  complained of any chest pain and EKG is unchanged from previous. She has some slight crackles in the right lower lobe, rigidity suspicious for possibility of pneumonia so we will evaluate. Chest x-ray. She does have a cough, and she is report of low-grade fever with EMS though none here presently. In addition she has slight dysuria. She has no evidence of acute dyspnea or increased work of breathing in the ER except for subjective concern. We will perform workup including chest x-ray, cardiac panel, basic labs, BNP and follow her clinically.  The patient's urinalysis are quite stable. Ongoing care and disposition assigned to Dr. Devra Dopp at 3 PM with plan to admit the patient given her significant polycythemia, hematocrit over 50, leukocytosis and severe thrombo-cytosis. Question if the patient could be having congestive heart failure or high cardiac output failure due to her significant polycythemia. ____________________________________________   FINAL CLINICAL IMPRESSION(S) / ED DIAGNOSES  Final diagnoses:  Dyspnea      Delman Kitten, MD 09/22/15 1615

## 2015-09-22 NOTE — ED Notes (Signed)
Patient brought in by [facility] for c/o shortness of breath, fever, and burning with urination.

## 2015-09-22 NOTE — Progress Notes (Signed)
ANTICOAGULATION CONSULT NOTE - Initial Consult  Pharmacy Consult for warfarin Indication: history of DVT  No Known Allergies  Patient Measurements: Height: 5\' 5"  (165.1 cm) Weight: 203 lb (92.08 kg) IBW/kg (Calculated) : 57  Vital Signs: Temp: 98.2 F (36.8 C) (11/12 1759) Temp Source: Oral (11/12 1759) BP: 139/72 mmHg (11/12 1758) Pulse Rate: 89 (11/12 1759)  Labs:  Recent Labs  09/22/15 1454 09/22/15 1531  HGB  --  17.0*  HCT  --  55.4*  PLT  --  1286*  LABPROT 28.6*  --   INR 2.74  --   CREATININE  --  0.67  TROPONINI  --  0.05*    Estimated Creatinine Clearance: 61.8 mL/min (by C-G formula based on Cr of 0.67).   Medical History: Past Medical History  Diagnosis Date  . Hypertension   . Deep venous thrombosis (HCC)     right lower extremity  . History of hysterectomy   . History of bilateral hip replacements   . Polycythemia vera ([place])   . Dependent edema   . Arthritis   . Collagen vascular disease ([place])   . Stroke [facility])     Pt reports she was told she had a stroke in may    Assessment: Pharmacy consulted to dose warfarin in this 81 year old female admitted with shortness of breath. Patient was taking warfarin prior to admission with a home dose of warfarin 5 mg PO daily for a history of DVT.  INR is therapeutic today at 2.74  Goal of Therapy:  INR 2-3 Monitor platelets by anticoagulation protocol: Yes   Plan:  Continue home dose and give warfarin 5mg  PO dose x1 tonight Will follow up with INR and CBC in the morning  Thank you for the consult.  Darylene Price Ezana Hubbert 09/22/2015,7:07 PM

## 2015-09-22 NOTE — ED Notes (Signed)
Lab called, per lab both lavender tubes and light green tube hemolyzed. Will need to be re-drawn and new orders entered.

## 2015-09-23 ENCOUNTER — Observation Stay
Admit: 2015-09-23 | Discharge: 2015-09-23 | Disposition: A | Discharge status: Home / Self Care | Payer: Medicare Other | Attending: Internal Medicine | Admitting: Internal Medicine

## 2015-09-23 ENCOUNTER — Other Ambulatory Visit: Payer: Medicare Other

## 2015-09-23 DIAGNOSIS — R5383 Other fatigue: Secondary | ICD-10-CM

## 2015-09-23 DIAGNOSIS — Z86718 Personal history of other venous thrombosis and embolism: Secondary | ICD-10-CM

## 2015-09-23 DIAGNOSIS — J441 Chronic obstructive pulmonary disease with (acute) exacerbation: Secondary | ICD-10-CM | POA: Diagnosis not present

## 2015-09-23 DIAGNOSIS — R509 Fever, unspecified: Secondary | ICD-10-CM | POA: Diagnosis not present

## 2015-09-23 DIAGNOSIS — D45 Polycythemia vera: Secondary | ICD-10-CM

## 2015-09-23 DIAGNOSIS — R0602 Shortness of breath: Secondary | ICD-10-CM

## 2015-09-23 DIAGNOSIS — Z86711 Personal history of pulmonary embolism: Secondary | ICD-10-CM

## 2015-09-23 DIAGNOSIS — Z7901 Long term (current) use of anticoagulants: Secondary | ICD-10-CM

## 2015-09-23 DIAGNOSIS — I10 Essential (primary) hypertension: Secondary | ICD-10-CM

## 2015-09-23 DIAGNOSIS — Z7982 Long term (current) use of aspirin: Secondary | ICD-10-CM

## 2015-09-23 DIAGNOSIS — Z79899 Other long term (current) drug therapy: Secondary | ICD-10-CM

## 2015-09-23 DIAGNOSIS — Z8673 Personal history of transient ischemic attack (TIA), and cerebral infarction without residual deficits: Secondary | ICD-10-CM

## 2015-09-23 DIAGNOSIS — M1990 Unspecified osteoarthritis, unspecified site: Secondary | ICD-10-CM

## 2015-09-23 LAB — BASIC METABOLIC PANEL
Anion gap: 6 (ref 5–15)
BUN: 9 mg/dL (ref 6–20)
CALCIUM: 8.6 mg/dL — AB (ref 8.9–10.3)
CO2: 26 mmol/L (ref 22–32)
CREATININE: 0.48 mg/dL (ref 0.44–1.00)
Chloride: 108 mmol/L (ref 101–111)
GFR calc non Af Amer: >60 mL/min (ref >60)
Glucose, Bld: 101 mg/dL — ABNORMAL HIGH (ref 65–99)
Potassium: 4.1 mmol/L (ref 3.5–5.1)
SODIUM: 140 mmol/L (ref 135–145)

## 2015-09-23 LAB — CBC
HCT: 50.9 % — ABNORMAL HIGH (ref 35.0–47.0)
Hemoglobin: 15.8 g/dL (ref 12.0–16.0)
MCH: 25.4 pg — AB (ref 26.0–34.0)
MCHC: 30.9 g/dL — ABNORMAL LOW (ref 32.0–36.0)
MCV: 82.1 fL (ref 80.0–100.0)
PLATELETS: 1051 10*3/uL — AB (ref 150–440)
RBC: 6.2 MIL/uL — AB (ref 3.80–5.20)
RDW: 21.8 % — AB (ref 11.5–14.5)
WBC: 25.4 10*3/uL — ABNORMAL HIGH (ref 3.6–11.0)

## 2015-09-23 LAB — PROTIME-INR
INR: 3.32
PROTHROMBIN TIME: 33 s — AB (ref 11.4–15.0)

## 2015-09-23 MED ORDER — AZITHROMYCIN 250 MG PO TABS
500.0000 mg | ORAL_TABLET | Freq: Every day | ORAL | Status: AC
  Administered 2015-09-23: 500 mg via ORAL
  Filled 2015-09-23: qty 2

## 2015-09-23 MED ORDER — CHLORHEXIDINE GLUCONATE CLOTH 2 % EX PADS
6.0000 | MEDICATED_PAD | Freq: Every day | CUTANEOUS | Status: DC
  Administered 2015-09-23 – 2015-09-25 (×3): 6 via TOPICAL

## 2015-09-23 MED ORDER — IPRATROPIUM-ALBUTEROL 0.5-2.5 (3) MG/3ML IN SOLN
3.0000 mL | RESPIRATORY_TRACT | Status: DC
  Administered 2015-09-23 – 2015-09-24 (×4): 3 mL via RESPIRATORY_TRACT
  Filled 2015-09-23 (×4): qty 3

## 2015-09-23 MED ORDER — AZITHROMYCIN 250 MG PO TABS
250.0000 mg | ORAL_TABLET | Freq: Every day | ORAL | Status: DC
  Administered 2015-09-24 – 2015-09-25 (×2): 250 mg via ORAL
  Filled 2015-09-23 (×2): qty 1

## 2015-09-23 MED ORDER — BUDESONIDE-FORMOTEROL FUMARATE 160-4.5 MCG/ACT IN AERO
2.0000 | INHALATION_SPRAY | Freq: Two times a day (BID) | RESPIRATORY_TRACT | Status: DC
  Administered 2015-09-23 – 2015-09-25 (×5): 2 via RESPIRATORY_TRACT
  Filled 2015-09-23: qty 6

## 2015-09-23 NOTE — Progress Notes (Signed)
ANTICOAGULATION CONSULT NOTE - FOLLOW UP   Pharmacy Consult for warfarin Indication: history of DVT  No Known Allergies  Patient Measurements: Height: 5\' 5"  (165.1 cm) Weight: 203 lb (92.08 kg) IBW/kg (Calculated) : 57  Vital Signs: Temp: 98.1 F (36.7 C) (11/13 0554) Temp Source: Oral (11/13 0554) BP: 128/76 mmHg (11/13 0554) Pulse Rate: 81 (11/13 0554)  Labs:  Recent Labs  09/22/15 1454 09/22/15 1531 09/22/15 2219 09/23/15 0522  HGB  --  17.0*  --  15.8  HCT  --  55.4*  --  50.9*  PLT  --  1286*  --  1051*  LABPROT 28.6*  --   --  33.0*  INR 2.74  --   --  3.32  CREATININE  --  0.67  --  0.48  TROPONINI  --  0.05* 0.05*  --     Estimated Creatinine Clearance: 61.8 mL/min (by C-G formula based on Cr of 0.48).   Medical History: Past Medical History  Diagnosis Date  . Hypertension   . Deep venous thrombosis (HCC)     right lower extremity  . History of hysterectomy   . History of bilateral hip replacements   . Polycythemia vera ([place])   . Dependent edema   . Arthritis   . Collagen vascular disease ([place])   . Stroke [facility])     Pt reports she was told she had a stroke in may    Assessment: Pharmacy consulted to dose warfarin in this 81 year old female admitted with shortness of breath. Patient was taking warfarin prior to admission with a home dose of warfarin 5 mg PO daily for a history of DVT.  INR supratherapeutic today @ 3.32  INR is therapeutic today at 2.74  Goal of Therapy:  INR 2-3 Monitor platelets by anticoagulation protocol: Yes   Plan:  Will hold today's dose of warfarin and will assess INR tomorrow for dosing of warfarin.   Thank you for the consult.  Ophie Burrowes D 09/23/2015,9:10 AM

## 2015-09-23 NOTE — Clinical Social Work Note (Signed)
Clinical Social Work Assessment  Patient Details  Name: Yolanda Wells MRN: [id number] Date of Birth: [birth year]  Date of referral:  09/23/15               Reason for consult:   (Staff thought Yolanda Wells was a nursing facility; it is actually an independent apartment complex)                Permission sought to share information with:  Family Supports Permission granted to share information::  Yes, Verbal Permission Granted  Name::        Agency::     Relationship::     Contact Information:     Housing/Transportation Living arrangements for the past 2 months:  [place] of Information:  Patient, Other (Comment Required) (grandaughter: [name]: [phone]) Patient Interpreter Needed:  None Criminal Activity/Legal Involvement Pertinent to Current Situation/Hospitalization:  No - Comment as needed Significant Relationships:  Other Family Members Advertising account executive) Lives with:   (grandaughter lives with patient 5 days out of the week) Do you feel safe going back to the place where you live?  Yes Need for family participation in patient care:  Yes (Comment)  Care giving concerns:  Patient has granddaughter that lives with her 5 days a week and is alone in the evening. Patient's granddaughter hires caregivers to stay with patient on the weekend.    Social Worker assessment / plan:  CSW spoke with patient earlier today and she was a poor historian. She was not able to explain if she had been in a facility for rehab in the past and knew she had home health but didn't remember what agency or if she is able to ambulate, etc. Patient's granddaughter arrived to the hospital this afternoon and CSW spoke with her in patient's room. Patient remembered me from earlier. Patient's granddaughter explained that she cares for patient during the day and then works night shift and then pays caregivers to attend to patient on the weekend. Patient's granddaughter explained that patient was  sent for rehab in the past to [facility] and then was transitioned to [facility] after patient was found to have a PE. Patient's granddaughter explained patient had been out of a nursing facility since Sept 9th and she was already aware that patient will have to be out of a facility for 60 days before she would be able to recoup her medicare days. Patient's granddaughter is willing to assist wherever she is needed with patient and states that we can call her anytime. Unsure of discharge disposition at this time.  Employment status:  Retired Forensic scientist:  Medicare PT Recommendations:  Not assessed at this time Information / Referral to community resources:     Patient/Family's Response to care:  Patient's granddaughter appeared concerned regarding patient upon arrival so CSW retrieved the patient's nurse for her.   Patient/Family's Understanding of and Emotional Response to Diagnosis, Current Treatment, and Prognosis:  Patient's granddaughter expressed appreciation for CSW assistance.   Emotional Assessment Appearance:  Appears stated age Attitude/Demeanor/Rapport:   (pleasant and cooperative) Affect (typically observed):  Calm, Happy Orientation:  Oriented to Self, Oriented to Place, Oriented to Situation Alcohol / Substance use:  Not Applicable Psych involvement (Current and /or in the community):  No (Comment)  Discharge Needs  Concerns to be addressed:  Care Coordination Readmission within the last 30 days:  No Current discharge risk:  None Barriers to Discharge:  No Barriers Identified   Shela Leff, LCSW 09/23/2015, 2:38 PM

## 2015-09-23 NOTE — Plan of Care (Signed)
Problem: Bowel/Gastric: Goal: Will not experience complications related to bowel motility Outcome: Progressing Pt alert and oriented. No c/o pain nor distress noted. Colostomy in place. 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 Rested thru out this shift. Continue to monitor.

## 2015-09-23 NOTE — Plan of Care (Signed)
Pt w/polycythemia - elevated platelets - on phlebotomy protocol but hasn't had one in a year.  Plan is to have small amt of phlebotomy 250 cc tomorrow.  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  Was also here in Sept w/pulmonary infiltrate - inhalers, breathing tx and PO abx. ECHO done today.

## 2015-09-23 NOTE — Clinical Social Work Note (Signed)
As a side note, patient's visit status is observation at this time. Patient's granddaughter expressed knowledge about not being eligible to go to a nursing facility if observation. Shela Leff MSW,LCSW [phone]

## 2015-09-23 NOTE — Progress Notes (Signed)
[facility]  Date of admission:  09/22/2015  Inpatient day:  09/23/2015  Consulting physician: Demetrios Loll, MD   Reason for Consultation:  Polycythemia.  Chief Complaint: Yolanda Wells is a 81 y.o. female with polycythemia rubra vera (PV) who was admitted with worsening shortness of breath.  HPI: The patient is a unclear exactly when she was diagnosed with polycythemia rubra vera (PV). She has been followed by Dr. Sterling Big at the [facility]. Notes from 02/20/2015 confirm that she has a JAK2 positive polycythemia rubra vera (PV) and has been treated on a phlebotomy program and hydroxyurea in the past.   Last phlebotomy was approximately 1 year ago.  Previously she was on hydroxyurea 1500 mg Monday through Thursday and 2000 mg on Friday through Sunday. Because of difficulty controlling both her white count (too low) and platelet count (elevated), hydroxyurea was decreased to 1500 mg Monday through Friday and 2000 mg Saturday and Sunday.  Because of ongoing issues white count and platelet count, she underwent P32 treatment in 03/2015. She was felt not to be a good candidate for interferon or anagrelide. Labs on 02/20/2015 revealed a hematocrit of 44.8, hemoglobin 13.7, platelets 934,000, white count 3200 with an [place] of 2000.  Patient states that all of her troubles since the beginning of 03/2015. Timing of events is slightly unclear. 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 She was noted to have abdominal pain and splenic flexure bleeding 04/02/2015.  She underwent micro-embolization then colectomy on 04/03/2015.  The patient was diagnosed with bilateral lower extremity DVTs on 05/18/2015. She was also noted to have pulmonary emboli on 05/24/2015.  She underwent IVC filter placement on 05/25/2015.  She was last seen she was last admitted to [facility] on 07/15/2015 with a pulmonary infiltrate. Cultures were negative.  She  was treated with Levaquin.  It was suspected that she had a clot in her IVC filter on Xarelto.  She was felt not to be a good candidate for IVC filter placement.  She was bridged from Lovenox to Coumadin.  Review of recent labs and noted a increasing hematocrit and platelet count. Hematocrit was 42.4 on 07/20/2015, 53.2 on 09/03/2015, 54.8 on 09/15/2015, and 55.4 on admission. Platelet count was 805,000 on 07/20/2015, 1.02 million on 09/03/2015, 954,000 on 09/15/2015, and 1.29 million on admission.  White count has fluctuated slightly between 23,000 and 28,000.  Symptomatically, over the past 3 days she has had increasing shortness of breath. She also notes some chills.  Initial temperature was 100.5. She tires easily and overall notes worsening health since her P32 treatment and discontinuation of her phlebotomy program and hydroxyurea.  Past Medical History  Diagnosis Date  . Hypertension   . Deep venous thrombosis (HCC)     right lower extremity  . History of hysterectomy   . History of bilateral hip replacements   . Polycythemia vera ([place])   . Dependent edema   . Arthritis   . Collagen vascular disease ([place])   . Stroke [facility])     Pt reports she was told she had a stroke in may    Past Surgical History  Procedure Laterality Date  . Toe amputation      right  . Joint replacement      Left and Right Hip  . Abdominal hysterectomy    . Peripheral vascular catheterization N/A 04/02/2015    Procedure: Visceral Angiography;  Surgeon: Algernon Huxley, MD;  Location: [facility]  INVASIVE CV LAB;  Service: Cardiovascular;  Laterality: N/A;  . Peripheral vascular catheterization N/A 04/02/2015    Procedure: Visceral Artery Intervention;  Surgeon: Algernon Huxley, MD;  Location: [place] CV LAB;  Service: Cardiovascular;  Laterality: N/A;  . Colectomy with colostomy creation/hartmann procedure N/A 04/03/2015    Procedure: COLECTOMY WITH COLOSTOMY CREATION/HARTMANN PROCEDURE;  Surgeon: Sherri Rad, MD;   Location: [facility] ORS;  Service: General;  Laterality: N/A;  . Cardiac catheterization Right 04/03/2015    Procedure: CENTRAL LINE INSERTION;  Surgeon: Sherri Rad, MD;  Location: [facility] ORS;  Service: General;  Laterality: Right;  . Peripheral vascular catheterization N/A 05/25/2015    Procedure: IVC Filter Insertion;  Surgeon: Katha Cabal, MD;  Location: [place] CV LAB;  Service: Cardiovascular;  Laterality: N/A;    Family History  Problem Relation Age of Onset  . Heart attack Father   . Hypertension    . Arthritis-Osteo    . COPD Father   . Diabetes Brother   . Osteosarcoma Son     Social History:  reports that she has never smoked. She has never used smokeless tobacco. She reports that she does not drink alcohol or use illicit drugs.  She lives alone on [address] (independent living).  Her grand-daughter visits regularly [name] [phone]).  Allergies: No Known Allergies  Medications Prior to Admission  Medication Sig Dispense Refill  . aspirin EC 81 MG tablet Take 1 tablet (81 mg total) by mouth daily. 30 tablet 2  . docusate sodium (COLACE) 100 MG capsule Take 100 mg by mouth 2 (two) times daily.    . ferrous sulfate 325 (65 FE) MG tablet Take 325 mg by mouth 2 (two) times daily with a meal.    . folic acid (FOLVITE) 364 MCG tablet Take 400 mcg by mouth daily.    . furosemide (LASIX) 20 MG tablet Take 20 mg by mouth daily.    . mupirocin ointment (BACTROBAN) 2 % Place 1 application into the nose 2 (two) times daily. 22 g 0  . ondansetron (ZOFRAN) 4 MG tablet Take 4 mg by mouth every 6 (six) hours as needed for nausea or vomiting.    [name] oxyCODONE-acetaminophen (PERCOCET/ROXICET) 5-325 MG tablet Take 1 tablet by mouth every 6 (six) hours as needed for severe pain. 20 tablet 0  . potassium chloride SA (K-DUR,KLOR-CON) 20 MEQ tablet Take 1 tablet (20 mEq total) by mouth 2 (two) times daily. 30 tablet 0  . simethicone (MYLICON) 80 MG chewable tablet Chew 30  mg by mouth 4 (four) times daily -  before meals and at bedtime.    [name] warfarin (COUMADIN) 5 MG tablet Take 1 tablet (5 mg total) by mouth daily at 6 PM. 20 tablet 0    Review of Systems: GENERAL:  Fatigue.  Tires easily.  Chills.  No fevers or weight loss. PERFORMANCE STATUS (ECOG):  2 HEENT:  No visual changes, runny nose, sore throat, mouth sores or tenderness. Lungs: Shortness of breath.  No cough.  No hemoptysis. Cardiac:  No chest pain, palpitations, orthopnea, or PND. GI:  Colostomy.  No nausea, vomiting, diarrhea, constipation, melena or hematochezia. GU:  No urgency, frequency, dysuria, or hematuria. Musculoskeletal:  No back pain.  No joint pain.  No muscle tenderness. Extremities:  Right toes amputated.  Left foot problems.  No pain or swelling. Skin:  No rashes or skin changes. Neuro:  No headache, numbness or weakness, balance or coordination issues. Endocrine:  No diabetes,  thyroid issues, hot flashes or night sweats. Psych:  No mood changes, depression or anxiety. Pain:  No focal pain. Review of systems:  All other systems reviewed and found to be negative.  Physical Exam:  Blood pressure 128/76, pulse 81, temperature 98.1 F (36.7 C), temperature source Oral, resp. rate 20, height _0  (1.651 m), weight 203 lb (92.08 kg), SpO2 95 %.  GENERAL:  Elderly woman, sitting comfortably on the medical unit in no acute distress. MENTAL STATUS:  Alert and oriented to person, place and time. HEAD:  Graying hair in thin braids.  Normocephalic, atraumatic, face symmetric, no Cushingoid features. EYES:  Brown eyes.  Pupils equal round and reactive to light and accomodation.  No conjunctivitis or scleral icterus. ENT:  Oropharynx clear without lesion.  Upper dentures. Tongue normal. Mucous membranes moist.  RESPIRATORY:  Clear to auscultation without rales, wheezes or rhonchi. CARDIOVASCULAR:  Regular rate and rhythm without murmur, rub or gallop. ABDOMEN:  Left sided colostomy.  Soft,  non-tender, with active bowel sounds, and no appreciable hepatosplenomegaly.  No masses. SKIN:  No rashes, ulcers or lesions. EXTREMITIES:  S/p amputation of right toes.  Left foot slightly cool.  No edema, no skin discoloration or tenderness.  No palpable cords. LYMPH NODES: No palpable cervical, supraclavicular, axillary or inguinal adenopathy  NEUROLOGICAL: Unremarkable. PSYCH:  Appropriate.  Results for orders placed or performed during the hospital encounter of 09/22/15 (from the past 48 hour(s))  Urinalysis complete, with microscopic ([facility] only)     Status: Abnormal   Collection Time: 09/22/15  2:54 PM  Result Value Ref Range   Color, Urine STRAW (A) YELLOW   APPearance CLEAR (A) CLEAR   Glucose, UA NEGATIVE NEGATIVE mg/dL   Bilirubin Urine NEGATIVE NEGATIVE   Ketones, ur NEGATIVE NEGATIVE mg/dL   Specific Gravity, Urine 1.005 1.005 - 1.030   Hgb urine dipstick NEGATIVE NEGATIVE   pH 5.0 5.0 - 8.0   Protein, ur NEGATIVE NEGATIVE mg/dL   Nitrite NEGATIVE NEGATIVE   Leukocytes, UA NEGATIVE NEGATIVE   RBC / HPF NONE SEEN 0 - 5 RBC/hpf   WBC, UA 0-5 0 - 5 WBC/hpf   Bacteria, UA RARE (A) NONE SEEN   Squamous Epithelial / LPF 0-5 (A) NONE SEEN   Mucous PRESENT   Protime-INR     Status: Abnormal   Collection Time: 09/22/15  2:54 PM  Result Value Ref Range   Prothrombin Time 28.6 (H) 11.4 - 15.0 seconds   INR 2.74   CBC     Status: Abnormal   Collection Time: 09/22/15  3:31 PM  Result Value Ref Range   WBC 27.7 (H) 3.6 - 11.0 K/uL   RBC 6.83 (H) 3.80 - 5.20 MIL/uL   Hemoglobin 17.0 (H) 12.0 - 16.0 g/dL    Comment: RESULT REPEATED AND VERIFIED   HCT 55.4 (H) 35.0 - 47.0 %   MCV 81.1 80.0 - 100.0 fL   MCH 24.9 (L) 26.0 - 34.0 pg   MCHC 30.7 (L) 32.0 - 36.0 g/dL   RDW 21.9 (H) 11.5 - 14.5 %   Platelets 1286 (HH) 150 - 440 K/uL    Comment: CRITICAL RESULT CALLED TO, READ BACK BY AND VERIFIED WITH: ANGELA ROBBINS AT 1542 ON 09/22/15 BY [facility]   Comprehensive metabolic panel      Status: Abnormal   Collection Time: 09/22/15  3:31 PM  Result Value Ref Range   Sodium 138 135 - 145 mmol/L   Potassium 3.7 3.5 - 5.1 mmol/L  Chloride 104 101 - 111 mmol/L   CO2 29 22 - 32 mmol/L   Glucose, Bld 126 (H) 65 - 99 mg/dL   BUN 9 6 - 20 mg/dL   Creatinine, Ser 0.67 0.44 - 1.00 mg/dL   Calcium 8.9 8.9 - 10.3 mg/dL   Total Protein 7.4 6.5 - 8.1 g/dL   Albumin 3.0 (L) 3.5 - 5.0 g/dL   AST 18 15 - 41 U/L   ALT 11 (L) 14 - 54 U/L   Alkaline Phosphatase 112 38 - 126 U/L   Total Bilirubin 0.8 0.3 - 1.2 mg/dL   GFR calc non Af Amer >60 >60 mL/min   GFR calc Af Amer >60 >60 mL/min    Comment: (NOTE) The eGFR has been calculated using the CKD EPI equation. This calculation has not been validated in all clinical situations. eGFR's persistently <60 mL/min signify possible Chronic Kidney Disease.    Anion gap 5 5 - 15  Troponin I     Status: Abnormal   Collection Time: 09/22/15  3:31 PM  Result Value Ref Range   Troponin I 0.05 (H) <0.031 ng/mL    Comment: READ BACK AND VERIFIED WITH ANGELA ROBBINS @ 1600 ON 09/22/2015 BY [facility]        PERSISTENTLY INCREASED TROPONIN VALUES IN THE RANGE OF 0.04-0.49 ng/mL CAN BE SEEN IN:       -UNSTABLE ANGINA       -CONGESTIVE HEART FAILURE       -MYOCARDITIS       -CHEST TRAUMA       -ARRYHTHMIAS       -LATE PRESENTING MYOCARDIAL INFARCTION       -COPD   CLINICAL FOLLOW-UP RECOMMENDED.   Brain natriuretic peptide     Status: None   Collection Time: 09/22/15  3:31 PM  Result Value Ref Range   B Natriuretic Peptide 100.0 0.0 - 100.0 pg/mL  Troponin I     Status: Abnormal   Collection Time: 09/22/15 10:19 PM  Result Value Ref Range   Troponin I 0.05 (H) <0.031 ng/mL    Comment: RESULTS PREVIOUSLY CALLED BY [facility] AT 2016 ON 11//12/16 [facility]        PERSISTENTLY INCREASED TROPONIN VALUES IN THE RANGE OF 0.04-0.49 ng/mL CAN BE SEEN IN:       -UNSTABLE ANGINA       -CONGESTIVE HEART FAILURE       -MYOCARDITIS       -CHEST TRAUMA        -ARRYHTHMIAS       -LATE PRESENTING MYOCARDIAL INFARCTION       -COPD   CLINICAL FOLLOW-UP RECOMMENDED.   Basic metabolic panel     Status: Abnormal   Collection Time: 09/23/15  5:22 AM  Result Value Ref Range   Sodium 140 135 - 145 mmol/L   Potassium 4.1 3.5 - 5.1 mmol/L   Chloride 108 101 - 111 mmol/L   CO2 26 22 - 32 mmol/L   Glucose, Bld 101 (H) 65 - 99 mg/dL   BUN 9 6 - 20 mg/dL   Creatinine, Ser 0.48 0.44 - 1.00 mg/dL   Calcium 8.6 (L) 8.9 - 10.3 mg/dL   GFR calc non Af Amer >60 >60 mL/min   GFR calc Af Amer >60 >60 mL/min    Comment: (NOTE) The eGFR has been calculated using the CKD EPI equation. This calculation has not been validated in all clinical situations. eGFR's persistently <60 mL/min signify possible Chronic Kidney Disease.  Anion gap 6 5 - 15  CBC     Status: Abnormal   Collection Time: 09/23/15  5:22 AM  Result Value Ref Range   WBC 25.4 (H) 3.6 - 11.0 K/uL   RBC 6.20 (H) 3.80 - 5.20 MIL/uL   Hemoglobin 15.8 12.0 - 16.0 g/dL   HCT 50.9 (H) 35.0 - 47.0 %   MCV 82.1 80.0 - 100.0 fL   MCH 25.4 (L) 26.0 - 34.0 pg   MCHC 30.9 (L) 32.0 - 36.0 g/dL   RDW 21.8 (H) 11.5 - 14.5 %   Platelets 1051 (HH) 150 - 440 K/uL    Comment: PLATELET COUNT CONFIRMED BY SMEAR CRITICAL RESULT CALLED TO, READ BACK BY AND VERIFIED WITH: SYLVIA FUENTES AT 3790 09/23/15.PMH   Protime-INR     Status: Abnormal   Collection Time: 09/23/15  5:22 AM  Result Value Ref Range   Prothrombin Time 33.0 (H) 11.4 - 15.0 seconds   INR 3.32    Dg Chest 2 View  09/22/2015  CLINICAL DATA:  Shortness of breath, fever and burning with urination. EXAM: CHEST  2 VIEW COMPARISON:  09/14/2015, 07/15/2015 as well as chest CT 07/16/2015 and 09/03/2015 FINDINGS: The lungs are adequately inflated with continued elevation the right hemidiaphragm. Mild opacification over the posterior right base unchanged likely atelectasis. No significant effusion. Left lung is clear. Mild stable cardiomegaly. Mild  calcified plaque over the thoracic aorta. There are degenerative changes of the spine and shoulders. IMPRESSION: Stable right base atelectasis. Mild stable cardiomegaly. Electronically Signed   By: Marin Olp M.D.   On: 09/22/2015 16:34    Assessment:  The patient is a 81 y.o.  African-American woman with JAK2+ polycythemia rubra vera (PV) previously on a phlebotomy program and hydroxyurea.  She received P32 in an attempt to manage her counts in 03/2015.    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  She was diagnosed with bilateral lower extremity DVTs on 05/18/2015 and bilateral pulmonary emboli on 05/24/2015.  She underwent IVC filter placement on 05/25/2015.  Recent labs note an increasing hematocrit and platelet count. White count has fluctuated slightly between 23,000 and 28,000.  Symptomatically, her energy level has declined.  She has increased shortness of breath.   Plan:   1.  Discuss reinstitution of phlebotomy program and hydroxyurea.  Will start with small volume phlebotomy (250 cc) given her age, although patient states that she has tolerated full unit phlebotomies in the past.  Anticipate reinstitution of hydroxyurea as an outpatient after discussion with Dr. Gaylyn Cheers.  Ultimate outpatient goal:  HCT < 42 and platelets < 400,000. 2.  Phlebotomy 250 cc today.  Patient consented. 3.  Daily CBC with diff. 4.  PT/INR in AM.  Per prior orders, pharmacy adjusting Coumadin dose. 5.  Phone follow-up with grand-daughter.  Unable to leave a message as voicemail was full.  Thank you for allowing me to participate in Yolanda Wells 's care.  I will follow her closely with you while hospitalized and after discharge in the outpatient department.  She wishes to establish care in [place] as she no longer lives in the [place] area and transportation costs are  excessive.  Lequita Asal, MD  09/23/2015, 12:52 PM

## 2015-09-23 NOTE — Progress Notes (Signed)
*  PRELIMINARY RESULTS* Echocardiogram 2D Echocardiogram has been performed.  Yolanda Wells 09/23/2015, 12:41 PM

## 2015-09-23 NOTE — Progress Notes (Signed)
Critical platelet count results of 1051. Paged MD and notified. Spoke with Dr. Manuella Ghazi. No new orders. Continue to monitor.
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
## 2015-09-24 ENCOUNTER — Observation Stay: Payer: Medicare Other

## 2015-09-24 ENCOUNTER — Ambulatory Visit
Admit: 2015-09-24 | Discharge: 2015-09-24 | Disposition: A | Discharge status: Home / Self Care | Payer: Medicare Other | Attending: Internal Medicine | Admitting: Internal Medicine

## 2015-09-24 ENCOUNTER — Encounter: Payer: Self-pay | Admitting: Radiology

## 2015-09-24 DIAGNOSIS — J189 Pneumonia, unspecified organism: Secondary | ICD-10-CM

## 2015-09-24 DIAGNOSIS — J441 Chronic obstructive pulmonary disease with (acute) exacerbation: Secondary | ICD-10-CM | POA: Diagnosis not present

## 2015-09-24 DIAGNOSIS — J449 Chronic obstructive pulmonary disease, unspecified: Secondary | ICD-10-CM | POA: Insufficient documentation

## 2015-09-24 LAB — CBC
HEMATOCRIT: 50 % — AB (ref 35.0–47.0)
HEMOGLOBIN: 15.7 g/dL (ref 12.0–16.0)
MCH: 25.5 pg — AB (ref 26.0–34.0)
MCHC: 31.4 g/dL — ABNORMAL LOW (ref 32.0–36.0)
MCV: 81.4 fL (ref 80.0–100.0)
Platelets: 1091 10*3/uL (ref 150–440)
RBC: 6.14 MIL/uL — AB (ref 3.80–5.20)
RDW: 22 % — ABNORMAL HIGH (ref 11.5–14.5)
WBC: 25.8 10*3/uL — ABNORMAL HIGH (ref 3.6–11.0)

## 2015-09-24 LAB — PROTIME-INR
INR: 2.87
Prothrombin Time: 29.6 seconds — ABNORMAL HIGH (ref 11.4–15.0)

## 2015-09-24 LAB — TROPONIN I
TROPONIN I: 0.07 ng/mL — AB (ref <0.031)
TROPONIN I: 0.07 ng/mL — AB (ref <0.031)

## 2015-09-24 MED ORDER — ASPIRIN 325 MG PO TABS
325.0000 mg | ORAL_TABLET | Freq: Once | ORAL | Status: AC
  Administered 2015-09-24: 19:00:00 325 mg via ORAL
  Filled 2015-09-24: qty 1

## 2015-09-24 MED ORDER — IPRATROPIUM-ALBUTEROL 0.5-2.5 (3) MG/3ML IN SOLN: 3.0000 mL | RESPIRATORY_TRACT | Status: DC

## 2015-09-24 MED ORDER — WARFARIN SODIUM 4 MG PO TABS: 4.0000 mg | ORAL_TABLET | Freq: Every day | ORAL | Status: DC

## 2015-09-24 MED ORDER — HYDROCOD POLST-CPM POLST ER 10-8 MG/5ML PO SUER
5.0000 mL | Freq: Two times a day (BID) | ORAL | Status: DC
  Administered 2015-09-24 – 2015-09-25 (×2): 5 mL via ORAL
  Filled 2015-09-24 (×2): qty 5

## 2015-09-24 MED ORDER — BUDESONIDE-FORMOTEROL FUMARATE 160-4.5 MCG/ACT IN AERO: 2.0000 | INHALATION_SPRAY | Freq: Two times a day (BID) | RESPIRATORY_TRACT | Status: DC

## 2015-09-24 MED ORDER — MENTHOL 3 MG MT LOZG
1.0000 | LOZENGE | OROMUCOSAL | Status: DC | PRN
  Filled 2015-09-24: qty 9

## 2015-09-24 MED ORDER — AZITHROMYCIN 250 MG PO TABS: 250.0000 mg | ORAL_TABLET | Freq: Every day | ORAL | Status: DC

## 2015-09-24 MED ORDER — NITROGLYCERIN 2 % TD OINT
0.5000 [in_us] | TOPICAL_OINTMENT | Freq: Four times a day (QID) | TRANSDERMAL | Status: DC
  Administered 2015-09-24 – 2015-09-25 (×4): 0.5 [in_us] via TOPICAL
  Filled 2015-09-24 (×4): qty 1

## 2015-09-24 MED ORDER — METOPROLOL TARTRATE 25 MG PO TABS
25.0000 mg | ORAL_TABLET | Freq: Two times a day (BID) | ORAL | Status: DC
  Administered 2015-09-24 – 2015-09-25 (×2): 25 mg via ORAL
  Filled 2015-09-24 (×2): qty 1

## 2015-09-24 MED ORDER — IOHEXOL 350 MG/ML SOLN
75.0000 mL | Freq: Once | INTRAVENOUS | Status: AC | PRN
  Administered 2015-09-24: 75 mL via INTRAVENOUS

## 2015-09-24 MED ORDER — WARFARIN SODIUM 4 MG PO TABS
4.0000 mg | ORAL_TABLET | Freq: Every day | ORAL | Status: DC
Start: 2015-09-24 — End: 2015-09-25
  Administered 2015-09-24: 4 mg via ORAL
  Filled 2015-09-24: qty 1

## 2015-09-24 MED ORDER — GUAIFENESIN ER 600 MG PO TB12
600.0000 mg | ORAL_TABLET | Freq: Two times a day (BID) | ORAL | Status: DC
  Administered 2015-09-24 – 2015-09-25 (×2): 600 mg via ORAL
  Filled 2015-09-24 (×2): qty 1

## 2015-09-24 MED ORDER — LEVOFLOXACIN 500 MG PO TABS: 500.0000 mg | ORAL_TABLET | Freq: Every day | ORAL | Status: DC

## 2015-09-24 NOTE — Care Management (Signed)
Admitted to [facility] with the diagnosis of dyspnea. Lives at [place] x 5 years.  Granddaughter is [name] [phone]). Spoke with granddaughter on the telephone. [place] (08/2015) in the past. Seen Dr. Meryl Crutch in the past. Ms. Bodine has an appointment next Wednesday at [facility] in [place]. [facility] in the past for nursing physical therapy, and occupational therapy. A nursing assistant comes in now to assist with baths. Housekeeper for cleaning. Uses a rolling walker, wheelchair (motorized). Good appetite. No falls. Granddaughter states that there is someone in the home 14/24. Shelbie Ammons RN MSN CCM Care Management [phone]

## 2015-09-24 NOTE — Progress Notes (Signed)
ANTICOAGULATION CONSULT NOTE - Follow Up Consult  Pharmacy Consult for Coumadin Indication: pulmonary embolus and DVT in past  No Known Allergies  Patient Measurements: Height: 5\' 5"  (165.1 cm) Weight: 203 lb (92.08 kg) IBW/kg (Calculated) : 57 Heparin Dosing Weight: na  Vital Signs: Temp: 98.2 F (36.8 C) (11/14 0533) Temp Source: Oral (11/14 0533) BP: 111/66 mmHg (11/14 1245) Pulse Rate: 98 (11/14 1245)  Labs:  Recent Labs  09/22/15 1454  09/22/15 1531 09/22/15 2219 09/23/15 0522 09/24/15 0501  HGB  --   < > 17.0*  --  15.8 15.7  HCT  --   --  55.4*  --  50.9* 50.0*  PLT  --   --  1286*  --  1051* 1091*  LABPROT 28.6*  --   --   --  33.0* 29.6*  INR 2.74  --   --   --  3.32 2.87  CREATININE  --   --  0.67  --  0.48  --   TROPONINI  --   --  0.05* 0.05*  --   --   < > = values in this interval not displayed.  Estimated Creatinine Clearance: 61.8 mL/min (by C-G formula based on Cr of 0.48).   Medications:  Scheduled:  . aspirin EC  81 mg Oral Daily  . azithromycin  250 mg Oral Daily  . budesonide-formoterol  2 puff Inhalation BID  . Chlorhexidine Gluconate Cloth  6 each Topical Q0600  . docusate sodium  100 mg Oral BID  . ferrous sulfate  325 mg Oral BID WC  . folic acid  [id number] mcg Oral Daily  . furosemide  20 mg Oral Daily  . Influenza vac split quadrivalent PF  0.5 mL Intramuscular Tomorrow-1000  . mupirocin ointment  1 application Nasal BID  . potassium chloride SA  20 mEq Oral BID  . simethicone  40 mg Oral TID AC & HS  . Warfarin - Pharmacist Dosing Inpatient   Does not apply q1800    Assessment: Patient was taking Coumadin 5mg  daily at home for history of DVT/PE.   11/12 INR=2.74, Coumadin 5mg  given 11/13 INR=3.32, no Coumadin given 11/14 INR=2.87, Coumadin 5mg  given  Goal of Therapy:  INR 2-3    Plan:  Will resume Coumadin at lower dose of 4mg  today and recheck INR each morning.  Paulina Fusi, PharmD, BCPS 09/24/2015 2:19 PM

## 2015-09-24 NOTE — Plan of Care (Signed)
Problem: Safety: Goal: Ability to remain free from injury will improve Outcome: Progressing Patient is high fall risk.  She has generalized weakness and is limited.  She has had all toes removed on right foot and left foot is turned inward.  She is compliant with using call bell for assistance.  Bed in lowest position with wheels locked and call bell within reach.  Bed alarm on throughout shift.  Patient without injury this shift.  Problem: Health Behavior/Discharge Planning: Goal: Ability to manage health-related needs will improve Outcome: Not Progressing Patient refused testing yesterday.  She stated to nurse, "I won't have that nuclear medicine, it is the reason I can't walk."  Patient said "the blood removal procedure is all she needs to get better."  Phlebotomy protocol planned for today - 250 cc removal planned.  Problem: Physical Regulation: Goal: Ability to maintain clinical measurements within normal limits will improve Outcome: Progressing Patient afebrile and VSS this shift. BP 121/67 mmHg  Pulse 85  Temp(Src) 98.2 F (36.8 C) (Oral)  Resp 18  Ht 5\' 5"  (1.651 m)  Wt 203 lb (92.08 kg)  BMI 33.78 kg/m2  SpO2 97%

## 2015-09-24 NOTE — Plan of Care (Signed)
Pt had therapeutic phlebotomy today - 250 cc drawn off.  Pt was going to be d/ced but pt began to have chest pains.  EKG done, CT done to rule out PE - no evidence of one. Did show bilateral lower lobe consolidation and atelectasis. Getting breathing tx, inhalers and po abx. Troponins elevated at 0.07 - dr called and she ordered metoprolol, nitroglycerin and cardiac consult. She is a possible d/c tomorrow.  Granddaughter was notified of all updates.

## 2015-09-24 NOTE — Plan of Care (Signed)
Called Dr. Clayton Bibles w/troponin result - 0.07.  Ordered to give 325 mg aspirin.  She sd she'd be putting in orders for metoprolol and nitroglycerin and a cardiology consult.
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
## 2015-09-25 DIAGNOSIS — R079 Chest pain, unspecified: Secondary | ICD-10-CM

## 2015-09-25 DIAGNOSIS — J441 Chronic obstructive pulmonary disease with (acute) exacerbation: Secondary | ICD-10-CM | POA: Diagnosis not present

## 2015-09-25 DIAGNOSIS — D72829 Elevated white blood cell count, unspecified: Secondary | ICD-10-CM

## 2015-09-25 LAB — TROPONIN I: Troponin I: <0.03 ng/mL (ref <0.031)

## 2015-09-25 LAB — PROTIME-INR
INR: 2.55
PROTHROMBIN TIME: 27.1 s — AB (ref 11.4–15.0)

## 2015-09-25 MED ORDER — HYDROCOD POLST-CPM POLST ER 10-8 MG/5ML PO SUER: 5.0000 mL | Freq: Two times a day (BID) | ORAL | Status: DC

## 2015-09-25 MED ORDER — MENTHOL 3 MG MT LOZG
1.0000 | LOZENGE | OROMUCOSAL | Status: DC | PRN
Start: 2015-09-25 — End: 2017-04-10

## 2015-09-25 MED ORDER — METOPROLOL TARTRATE 25 MG PO TABS: 25.0000 mg | ORAL_TABLET | Freq: Two times a day (BID) | ORAL | Status: DC

## 2015-09-25 MED ORDER — GUAIFENESIN ER 600 MG PO TB12: 600.0000 mg | ORAL_TABLET | Freq: Two times a day (BID) | ORAL | Status: DC

## 2015-09-25 NOTE — Plan of Care (Signed)
Problem: Safety: Goal: Ability to remain free from injury will improve Outcome: Progressing Patient is high fall risk. She has generalized weakness and is limited. She has had all toes removed on right foot and left foot is turned inward. She is compliant with using call bell for assistance. Bed in lowest position with wheels locked and call bell within reach. Bed alarm on throughout shift. Patient without injury this shift.  Problem: Physical Regulation: Goal: Ability to maintain clinical measurements within normal limits will improve Outcome: Progressing Patient afebrile and VSS this shift. Pt had therapeutic phlebotomy on day shift- 250 cc drawn off. Pt with bilateral lower lobe consolidation and atelectasis. Troponins on day shift elevated at 0.07, down to 0.03 on redraw this shift.  BP 132/60 mmHg  Pulse 89  Temp(Src) 98 F (36.7 C) (Oral)  Resp 20  Ht 5\' 5"  (1.651 m)  Wt 203 lb (92.08 kg)  BMI 33.78 kg/m2  SpO2 93%

## 2015-09-25 NOTE — Clinical Social Work Note (Signed)
CSW spoke with pt's granddaughter, who is primary caregiver, and provided information for LTC options for pt. Granddaughter will start the process of placement. Pt will discharge home with home health services. CSW is signing off as no further needs identified.   Darden Dates, MSW, LCSW Clinical Social Worker  [phone]

## 2015-09-25 NOTE — Progress Notes (Signed)
ANTICOAGULATION CONSULT NOTE - Follow Up Consult  Pharmacy Consult for Coumadin Indication: pulmonary embolus and DVT history  No Known Allergies  Patient Measurements: Height: 5\' 5"  (165.1 cm) Weight: 203 lb (92.08 kg) IBW/kg (Calculated) : 57 Heparin Dosing Weight: na  Vital Signs: Temp: 97.5 F (36.4 C) (11/15 0452) Temp Source: Oral (11/15 0452) BP: 138/79 mmHg (11/15 0452) Pulse Rate: 74 (11/15 0452)  Labs:  Recent Labs  09/22/15 1531  09/23/15 0522 09/24/15 0501 09/24/15 1637 09/24/15 1950 09/24/15 2344 09/25/15 0525  HGB 17.0*  --  15.8 15.7  --   --   --   --   HCT 55.4*  --  50.9* 50.0*  --   --   --   --   PLT 1286*  --  1051* 1091*  --   --   --   --   LABPROT  --   --  33.0* 29.6*  --   --   --  27.1*  INR  --   --  3.32 2.87  --   --   --  2.55  CREATININE 0.67  --  0.48  --   --   --   --   --   TROPONINI 0.05*  < >  --   --  0.07* 0.07* <0.03  --   < > = values in this interval not displayed.  Estimated Creatinine Clearance: 61.8 mL/min (by C-G formula based on Cr of 0.48).   Medications:  Scheduled:  . aspirin EC  81 mg Oral Daily  . azithromycin  250 mg Oral Daily  . budesonide-formoterol  2 puff Inhalation BID  . Chlorhexidine Gluconate Cloth  6 each Topical Q0600  . chlorpheniramine-HYDROcodone  5 mL Oral Q12H  . docusate sodium  100 mg Oral BID  . ferrous sulfate  325 mg Oral BID WC  . folic acid  [id number] mcg Oral Daily  . furosemide  20 mg Oral Daily  . guaiFENesin  600 mg Oral BID  . metoprolol tartrate  25 mg Oral BID  . mupirocin ointment  1 application Nasal BID  . nitroGLYCERIN  0.5 inch Topical 4 times per day  . potassium chloride SA  20 mEq Oral BID  . simethicone  40 mg Oral TID AC & HS  . warfarin  4 mg Oral q1800  . Warfarin - Pharmacist Dosing Inpatient   Does not apply q1800    Assessment: Patient was taking Coumadin 5mg  daily at home for history of DVT/PE.   11/12 INR=2.74, Coumadin 5mg  given 11/13 INR=3.32, no  Coumadin given 11/14 INR=2.87, Coumadin 4mg  given 11/15 INR=2.55  Goal of Therapy:  INR 2-3    Plan:  Will continue Coumadin 4mg  today and recheck INR each morning.  Paulina Fusi, PharmD, BCPS 09/25/2015 11:23 AM

## 2015-09-25 NOTE — Care Management (Signed)
Discharge to home today per Dr. Ether Griffins.  Followed by [facility] for services in the home. Granddaughter would like EMS transportation. Shelbie Ammons RN MSN CCM Care Management [phone]

## 2015-10-02 ENCOUNTER — Telehealth: Payer: Self-pay | Admitting: *Deleted

## 2015-10-02 ENCOUNTER — Inpatient Hospital Stay: Payer: Medicare Other

## 2015-10-02 ENCOUNTER — Inpatient Hospital Stay: Payer: Medicare Other | Attending: Hematology and Oncology | Admitting: Hematology and Oncology

## 2015-10-02 VITALS — BP 112/72 | HR 86 | Temp 96.8°F | Resp 18 | Ht 65.0 in

## 2015-10-02 DIAGNOSIS — D45 Polycythemia vera: Secondary | ICD-10-CM | POA: Diagnosis present

## 2015-10-02 DIAGNOSIS — Z86711 Personal history of pulmonary embolism: Secondary | ICD-10-CM | POA: Insufficient documentation

## 2015-10-02 DIAGNOSIS — Z86718 Personal history of other venous thrombosis and embolism: Secondary | ICD-10-CM | POA: Insufficient documentation

## 2015-10-02 DIAGNOSIS — D72829 Elevated white blood cell count, unspecified: Secondary | ICD-10-CM | POA: Insufficient documentation

## 2015-10-02 DIAGNOSIS — I10 Essential (primary) hypertension: Secondary | ICD-10-CM | POA: Diagnosis not present

## 2015-10-02 DIAGNOSIS — Z8673 Personal history of transient ischemic attack (TIA), and cerebral infarction without residual deficits: Secondary | ICD-10-CM | POA: Diagnosis not present

## 2015-10-02 DIAGNOSIS — Z7982 Long term (current) use of aspirin: Secondary | ICD-10-CM | POA: Diagnosis not present

## 2015-10-02 DIAGNOSIS — I2699 Other pulmonary embolism without acute cor pulmonale: Secondary | ICD-10-CM

## 2015-10-02 DIAGNOSIS — Z79899 Other long term (current) drug therapy: Secondary | ICD-10-CM | POA: Diagnosis not present

## 2015-10-02 DIAGNOSIS — R748 Abnormal levels of other serum enzymes: Secondary | ICD-10-CM | POA: Diagnosis not present

## 2015-10-02 DIAGNOSIS — R5383 Other fatigue: Secondary | ICD-10-CM | POA: Diagnosis not present

## 2015-10-02 DIAGNOSIS — Z7901 Long term (current) use of anticoagulants: Secondary | ICD-10-CM | POA: Diagnosis not present

## 2015-10-02 DIAGNOSIS — D473 Essential (hemorrhagic) thrombocythemia: Secondary | ICD-10-CM

## 2015-10-02 LAB — COMPREHENSIVE METABOLIC PANEL
ALT: 14 U/L (ref 14–54)
AST: 18 U/L (ref 15–41)
Albumin: 2.9 g/dL — ABNORMAL LOW (ref 3.5–5.0)
Alkaline Phosphatase: 106 U/L (ref 38–126)
Anion gap: 8 (ref 5–15)
BUN: 8 mg/dL (ref 6–20)
CO2: 23 mmol/L (ref 22–32)
Calcium: 8.9 mg/dL (ref 8.9–10.3)
Chloride: 107 mmol/L (ref 101–111)
Creatinine, Ser: 0.6 mg/dL (ref 0.44–1.00)
GFR calc Af Amer: >60 mL/min (ref >60)
GFR calc non Af Amer: >60 mL/min (ref >60)
Glucose, Bld: 185 mg/dL — ABNORMAL HIGH (ref 65–99)
Potassium: 3.8 mmol/L (ref 3.5–5.1)
Sodium: 138 mmol/L (ref 135–145)
Total Bilirubin: <0.1 mg/dL — ABNORMAL LOW (ref 0.3–1.2)
Total Protein: 7.3 g/dL (ref 6.5–8.1)

## 2015-10-02 LAB — CBC WITH DIFFERENTIAL/PLATELET
Basophils Absolute: 0.1 10*3/uL (ref 0–0.1)
Basophils Relative: 0 %
Eosinophils Absolute: 0.7 10*3/uL (ref 0–0.7)
Eosinophils Relative: 2 %
HCT: 54 % — ABNORMAL HIGH (ref 35.0–47.0)
Hemoglobin: 16.9 g/dL — ABNORMAL HIGH (ref 12.0–16.0)
Lymphocytes Relative: 7 %
Lymphs Abs: 2.1 10*3/uL (ref 1.0–3.6)
MCH: 25.1 pg — ABNORMAL LOW (ref 26.0–34.0)
MCHC: 31.3 g/dL — ABNORMAL LOW (ref 32.0–36.0)
MCV: 80.3 fL (ref 80.0–100.0)
Monocytes Absolute: 0.1 10*3/uL — ABNORMAL LOW (ref 0.2–0.9)
Monocytes Relative: 1 %
Neutro Abs: 28.8 10*3/uL — ABNORMAL HIGH (ref 1.4–6.5)
Neutrophils Relative %: 90 %
Platelets: 1400 10*3/uL (ref 150–400)
RBC: 6.73 MIL/uL — ABNORMAL HIGH (ref 3.80–5.20)
RDW: 21.5 % — ABNORMAL HIGH (ref 11.5–14.5)
WBC: 31.8 10*3/uL — ABNORMAL HIGH (ref 3.6–11.0)

## 2015-10-02 NOTE — Progress Notes (Signed)
[facility] day:  10/02/2015  Chief Complaint: Yolanda Wells is a 81 y.o. female with polycythemia rubra vera (PV) who is seen for initial assessment after recent hospitalization.  HPI:   The patient was admitted to [facility] from 09/14/2015 to 09/17/2015.  She presented with a 3 day history of increasing shortness of breath with some chills. Initial temperature was 100.5.  She was noted to have elevated blood counts.  I was consulted.  She had elevated troponin likely due to demand ischemia.  Cardiology was consulted.  She was discharged on Keflex to complete a course of antibiotics for a UTI.  The patient is unclear exactly when she was diagnosed with polycythemia rubra vera (PV). She has been followed by Dr. Sterling Big at the [facility]. Notes from 02/20/2015 confirm that she has a JAK2 positive polycythemia rubra vera (PV) and has been treated on a phlebotomy program and hydroxyurea in the past.   Prior to recent events, her last phlebotomy was approximately 1 year ago. Previously she was on hydroxyurea 1500 mg Monday through Thursday and 2000 mg on Friday through Sunday. Because of difficulty controlling both her white count (too low) and platelet count (elevated), hydroxyurea was decreased to 1500 mg Monday through Friday and 2000 mg Saturday and Sunday. Because of ongoing issues white count and platelet count, she underwent P32 treatment in 03/2015. She was felt not to be a good candidate for interferon or anagrelide. Labs on 02/20/2015 revealed a hematocrit of 44.8, hemoglobin 13.7, platelets 934,000, white count 3200 with an [place] of 2000.  Patient states that all of her troubles since the beginning of 03/2015. Timing of events is slightly unclear. 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 She was noted to have abdominal pain and splenic flexure bleeding 04/02/2015. She underwent  micro-embolization then colectomy on 04/03/2015.  The patient was diagnosed with bilateral lower extremity DVTs on 05/18/2015. She was also noted to have pulmonary emboli on 05/24/2015. She underwent IVC filter placement on 05/25/2015.  She had previously been admitted to [facility] on 07/15/2015 with a pulmonary infiltrate. Cultures were negative. She was treated with Levaquin. It was suspected that she had a clot in her IVC filter on Xarelto. She was felt not to be a good candidate for IVC filter replacement. She was bridged from Lovenox to Coumadin.  INR was 2.55 on 09/25/2015.  Review of recent labs noted a increasing hematocrit and platelet count. Hematocrit was 42.4 on 07/20/2015, 53.2 on 09/03/2015, 54.8 on 09/15/2015, and 55.4 on admission. Platelet count was 805,000 on 07/20/2015, 1.02 million on 09/03/2015, 954,000 on 09/15/2015, and 1.29 million on admission. White count has fluctuated slightly between 23,000 and 28,000.  She underwent phlebotomy of 250 cc on 09/23/2015.  She was discharged on oral iron (inadvertantly).   Symptomatically, she remains fatigued.  She denies any fevers or sweats.  She saw the cardiologist who prescribed Levaquin for her today secondary to an unresolved pneumonia.    Past Medical History  Diagnosis Date  . Hypertension   . Deep venous thrombosis (HCC)     right lower extremity  . History of hysterectomy   . History of bilateral hip replacements   . Polycythemia vera ([place])   . Dependent edema   . Arthritis   . Collagen vascular disease ([place])   . Stroke [facility])     Pt reports she was told she had a  stroke in may    Past Surgical History  Procedure Laterality Date  . Toe amputation      right  . Joint replacement      Left and Right Hip  . Abdominal hysterectomy    . Peripheral vascular catheterization N/A 04/02/2015    Procedure: Visceral Angiography;  Surgeon: Algernon Huxley, MD;  Location: [place] [facility];  Service:  Cardiovascular;  Laterality: N/A;  . Peripheral vascular catheterization N/A 04/02/2015    Procedure: Visceral Artery Intervention;  Surgeon: Algernon Huxley, MD;  Location: [place] [facility];  Service: Cardiovascular;  Laterality: N/A;  . Colectomy with colostomy creation/hartmann procedure N/A 04/03/2015    Procedure: COLECTOMY WITH COLOSTOMY CREATION/HARTMANN PROCEDURE;  Surgeon: Sherri Rad, MD;  Location: [facility] ORS;  Service: General;  Laterality: N/A;  . Cardiac catheterization Right 04/03/2015    Procedure: CENTRAL LINE INSERTION;  Surgeon: Sherri Rad, MD;  Location: [facility] ORS;  Service: General;  Laterality: Right;  . Peripheral vascular catheterization N/A 05/25/2015    Procedure: IVC Filter Insertion;  Surgeon: Katha Cabal, MD;  Location: [place] [facility];  Service: Cardiovascular;  Laterality: N/A;    Family History  Problem Relation Age of Onset  . Heart attack Father   . Hypertension    . Arthritis-Osteo    . COPD Father   . Diabetes Brother   . Osteosarcoma Son     Social History:  reports that she has never smoked. She has never used smokeless tobacco. She reports that she does not drink alcohol or use illicit drugs.  She lives alone on [address] at [place] (independent living). Her grand-daughter visits regularly [name] [phone]).  The patient is accompanied by her grand-daughter today.  Allergies: No Known Allergies  Current Medications: Current Outpatient Prescriptions  Medication Sig Dispense Refill  . aspirin EC 81 MG tablet Take 1 tablet (81 mg total) by mouth daily. 30 tablet 2  . azithromycin (ZITHROMAX) 250 MG tablet Take 1 tablet (250 mg total) by mouth daily. 4 each 0  . budesonide-formoterol (SYMBICORT) 160-4.5 MCG/ACT inhaler Inhale 2 puffs into the lungs 2 (two) times daily. 1 Inhaler 12  . chlorpheniramine-HYDROcodone (TUSSIONEX) 10-8 MG/5ML SUER Take 5 mLs by mouth every 12 (twelve) hours. 140 mL 0  . docusate sodium (COLACE) 100 MG  capsule Take 100 mg by mouth 2 (two) times daily.    . ferrous sulfate 325 (65 FE) MG tablet Take 325 mg by mouth 2 (two) times daily with a meal.    . folic acid (FOLVITE) 712 MCG tablet Take 400 mcg by mouth daily.    . furosemide (LASIX) 20 MG tablet Take 20 mg by mouth daily.    [name] guaiFENesin (MUCINEX) 600 MG 12 hr tablet Take 1 tablet (600 mg total) by mouth 2 (two) times daily. 20 tablet 0  . ipratropium-albuterol (DUONEB) 0.5-2.5 (3) MG/3ML SOLN Take 3 mLs by nebulization every 4 (four) hours. 360 mL 6  . menthol-cetylpyridinium (CEPACOL) 3 MG lozenge Take 1 lozenge (3 mg total) by mouth as needed for sore throat. 100 tablet 12  . metoprolol tartrate (LOPRESSOR) 25 MG tablet Take 1 tablet (25 mg total) by mouth 2 (two) times daily. 60 tablet 6  . mupirocin ointment (BACTROBAN) 2 % Place 1 application into the nose 2 (two) times daily. 22 g 0  . ondansetron (ZOFRAN) 4 MG tablet Take 4 mg by mouth every 6 (six) hours as needed for nausea or vomiting.    [name]  oxyCODONE-acetaminophen (PERCOCET/ROXICET) 5-325 MG tablet Take 1 tablet by mouth every 6 (six) hours as needed for severe pain. 20 tablet 0  . potassium chloride SA (K-DUR,KLOR-CON) 20 MEQ tablet Take 1 tablet (20 mEq total) by mouth 2 (two) times daily. 30 tablet 0  . simethicone (MYLICON) 80 MG chewable tablet Chew 30 mg by mouth 4 (four) times daily -  before meals and at bedtime.    [name] warfarin (COUMADIN) 4 MG tablet Take 1 tablet (4 mg total) by mouth daily at 6 PM. 15 tablet 0   No current facility-administered medications for this visit.    Review of Systems:  GENERAL:  Fatigue.  No fevers, sweats or weight loss. PERFORMANCE STATUS (ECOG):  2 HEENT:  No visual changes, runny nose, sore throat, mouth sores or tenderness. Lungs:  Mild chronic shortness of breath.  No cough.  No hemoptysis. Cardiac:  No chest pain, palpitations, orthopnea, or PND. GI:  No nausea, vomiting, diarrhea, constipation, melena or hematochezia. GU:  No  urgency, frequency, dysuria, or hematuria. Musculoskeletal:  No back pain.  No joint pain.  No muscle tenderness. Extremities:  No pain or swelling. Skin:  No rashes or skin changes. Neuro:  No headache, numbness or weakness, balance or coordination issues. Endocrine:  No diabetes, thyroid issues, hot flashes or night sweats. Psych:  No mood changes, depression or anxiety. Pain:  No focal pain. Review of systems:  All other systems reviewed and found to be negative.  Physical Exam: Blood pressure 112/72, pulse 86, temperature 96.8 F (36 C), temperature source Tympanic, resp. rate 18, height _0  (1.651 m), SpO2 95 %. GENERAL:  Elderly woman sitting comfortably in a wheelchair in the exam room in no acute distress. MENTAL STATUS:  Alert and oriented to person, place and time. HEAD:  Wearing a multi-colored hat.  Gray hair.  Normocephalic, atraumatic, face symmetric, no Cushingoid features. EYES:  Brown eyes.  Pupils equal round and reactive to light and accomodation.  No conjunctivitis or scleral icterus. ENT:  Oropharynx clear without lesion.  Upper dentures.  Tongue normal. Mucous membranes moist.  RESPIRATORY:  Decreased breath sounds right base.  Clear to auscultation without rales, wheezes or rhonchi. CARDIOVASCULAR:  Regular rate and rhythm without murmur, rub or gallop. ABDOMEN:  Left sided colostomy.  Soft, non-tender, with active bowel sounds, and no appreciable hepatosplenomegaly.  No masses. SKIN:  Ecchymosis left hand.  No rashes, ulcers or lesions. EXTREMITIES: Slight swelling left lower extremity.  No skin discoloration or tenderness.  No palpable cords. LYMPH NODES: No palpable cervical, supraclavicular, axillary or inguinal adenopathy  NEUROLOGICAL: Unremarkable. PSYCH:  Appropriate.  Office Visit on 10/02/2015  Component Date Value Ref Range Status  . WBC 10/02/2015 31.8* 3.6 - 11.0 K/uL Final  . RBC 10/02/2015 6.73* 3.80 - 5.20 MIL/uL Final  . Hemoglobin 10/02/2015  16.9* 12.0 - 16.0 g/dL Final   RESULT REPEATED AND VERIFIED  . HCT 10/02/2015 54.0* 35.0 - 47.0 % Final   RESULT REPEATED AND VERIFIED  . MCV 10/02/2015 80.3  80.0 - 100.0 fL Final  . MCH 10/02/2015 25.1* 26.0 - 34.0 pg Final  . MCHC 10/02/2015 31.3* 32.0 - 36.0 g/dL Final  . RDW 10/02/2015 21.5* 11.5 - 14.5 % Final  . Platelets 10/02/2015 1400* 150 - 400 K/uL Final   Comment: RESULT REPEATED AND VERIFIED PLATELET COUNT CONFIRMED BY SMEAR CRITICAL RESULT CALLED TO, READ BACK BY AND VERIFIED WITH: NYO TO TRIAGE NURSE VM AS DR AND TEAM UNAVAILABLE 1338 10/02/15  [place] 1355 10/02/15   . Neutrophils Relative % 10/02/2015 90%   Final  . Neutro Abs 10/02/2015 28.8* 1.4 - 6.5 K/uL Final  . Lymphocytes Relative 10/02/2015 7%   Final  . Lymphs Abs 10/02/2015 2.1  1.0 - 3.6 K/uL Final  . Monocytes Relative 10/02/2015 1%   Final  . Monocytes Absolute 10/02/2015 0.1* 0.2 - 0.9 K/uL Final  . Eosinophils Relative 10/02/2015 2%   Final  . Eosinophils Absolute 10/02/2015 0.7  0 - 0.7 K/uL Final  . Basophils Relative 10/02/2015 0%   Final  . Basophils Absolute 10/02/2015 0.1  0 - 0.1 K/uL Final  . Sodium 10/02/2015 138  135 - 145 mmol/L Final  . Potassium 10/02/2015 3.8  3.5 - 5.1 mmol/L Final  . Chloride 10/02/2015 107  101 - 111 mmol/L Final  . CO2 10/02/2015 23  22 - 32 mmol/L Final  . Glucose, Bld 10/02/2015 185* 65 - 99 mg/dL Final  . BUN 10/02/2015 8  6 - 20 mg/dL Final  . Creatinine, Ser 10/02/2015 0.60  0.44 - 1.00 mg/dL Final  . Calcium 10/02/2015 8.9  8.9 - 10.3 mg/dL Final  . Total Protein 10/02/2015 7.3  6.5 - 8.1 g/dL Final  . Albumin 10/02/2015 2.9* 3.5 - 5.0 g/dL Final  . AST 10/02/2015 18  15 - 41 U/L Final  . ALT 10/02/2015 14  14 - 54 U/L Final  . Alkaline Phosphatase 10/02/2015 106  38 - 126 U/L Final  . Total Bilirubin 10/02/2015 <0.1* 0.3 - 1.2 mg/dL Final  . GFR calc non Af Amer 10/02/2015 >60  >60 mL/min Final  . GFR calc Af Amer 10/02/2015 >60  >60  mL/min Final   Comment: (NOTE) The eGFR has been calculated using the CKD EPI equation. This calculation has not been validated in all clinical situations. eGFR's persistently <60 mL/min signify possible Chronic Kidney Disease.   . Anion gap 10/02/2015 8  5 - 15 Final    Assessment:  Yolanda Wells is a 81 y.o. female African-American woman with JAK2+ polycythemia rubra vera (PV) previously on a phlebotomy program and hydroxyurea. She received P32 in an attempt to manage her counts in 03/2015.   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 She was diagnosed with bilateral lower extremity DVTs on 05/18/2015 and bilateral pulmonary emboli on 05/24/2015. She underwent IVC filter placement on 05/25/2015.  She is on Coumadin 4 mg a day.  INR was 2.55 on 09/25/2015.  She is on a baby aspirin.  She has progressive erythrocytosis, thrombocytosis, and leukocytosis.  She underwent phlebotomy for a hematocrit of 55.4 on 09/23/2015.  Hematocrit decreased to 50.0, but has again increased after initiation of oral iron.  Platelet count has increased from 1.1 million to 1.4 million.  White count has increased from 23,000 - 28,000 to 31,800.  Symptomatically, she remains fatigued. Exam reveals no appreciable hepatosplenomegaly.   Plan: 1.  Labs today:  CBC with diff and CMP. 2.  Discontinue oral iron. 3.  Continue phlebotomy program.  Hematocrit goal 42.  Phlebotomy 300 cc today. 4.  RTC weekly x 3 for CBC with diff +/- phlebotomy. 5.  Discuss reinstitution of hydroxyurea 500 mg a day.  Goal platelet count 400,000. 6.  Phone follow-up with Dr. Sterling Big ([phone]).  Spoke to covering hematologist. 7.  RTC in 1 month for MD assess, labs (CBC with diff, CMP, PT/INR) +/- phlebotomy.  Lequita Asal, MD  10/02/2015, 4:51 PM

## 2015-10-02 NOTE — Telephone Encounter (Signed)
Platelets are [id number]

## 2015-10-03 ENCOUNTER — Telehealth: Payer: Self-pay

## 2015-10-03 DIAGNOSIS — I2699 Other pulmonary embolism without acute cor pulmonale: Secondary | ICD-10-CM | POA: Insufficient documentation

## 2015-10-03 DIAGNOSIS — Z7901 Long term (current) use of anticoagulants: Secondary | ICD-10-CM | POA: Insufficient documentation

## 2015-10-03 DIAGNOSIS — I824Z2 Acute embolism and thrombosis of unspecified deep veins of left distal lower extremity: Secondary | ICD-10-CM

## 2015-10-03 HISTORY — DX: Other pulmonary embolism without acute cor pulmonale: I26.99

## 2015-10-03 HISTORY — DX: Long term (current) use of anticoagulants: Z79.01

## 2015-10-03 HISTORY — DX: Acute embolism and thrombosis of unspecified deep veins of left distal lower extremity: I82.4Z2

## 2015-10-03 NOTE — Telephone Encounter (Signed)
Called and spoke with pt's grandaughter about  Who will be the MD monitoring coumadin levels, pt has new MD at [facility] in [place] beginning today to monitor by the name of Dr. Janene Harvey.

## 2015-10-04 DIAGNOSIS — Z933 Colostomy status: Secondary | ICD-10-CM | POA: Insufficient documentation

## 2015-10-04 HISTORY — DX: Colostomy status: Z93.3

## 2015-10-08 ENCOUNTER — Ambulatory Visit: Payer: Medicare Other | Admitting: Hematology and Oncology

## 2015-10-09 ENCOUNTER — Ambulatory Visit: Payer: Medicare Other | Admitting: Oncology

## 2015-10-09 ENCOUNTER — Inpatient Hospital Stay: Payer: Medicare Other

## 2015-10-09 ENCOUNTER — Other Ambulatory Visit: Payer: Self-pay | Admitting: Hematology and Oncology
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
## 2015-10-12 DIAGNOSIS — R262 Difficulty in walking, not elsewhere classified: Secondary | ICD-10-CM | POA: Insufficient documentation

## 2015-10-12 HISTORY — DX: Difficulty in walking, not elsewhere classified: R26.2

## 2015-10-14 ENCOUNTER — Encounter: Payer: Self-pay | Admitting: Emergency Medicine

## 2015-10-14 ENCOUNTER — Emergency Department: Payer: Medicare Other

## 2015-10-14 ENCOUNTER — Emergency Department
Admission: EM | Admit: 2015-10-14 | Discharge: 2015-10-14 | Disposition: A | Discharge status: Home / Self Care | Payer: Medicare Other | Attending: Emergency Medicine | Admitting: Emergency Medicine

## 2015-10-14 DIAGNOSIS — Z7901 Long term (current) use of anticoagulants: Secondary | ICD-10-CM | POA: Diagnosis not present

## 2015-10-14 DIAGNOSIS — I10 Essential (primary) hypertension: Secondary | ICD-10-CM | POA: Diagnosis not present

## 2015-10-14 DIAGNOSIS — M79662 Pain in left lower leg: Secondary | ICD-10-CM | POA: Diagnosis not present

## 2015-10-14 DIAGNOSIS — Z433 Encounter for attention to colostomy: Secondary | ICD-10-CM | POA: Diagnosis not present

## 2015-10-14 DIAGNOSIS — G8929 Other chronic pain: Secondary | ICD-10-CM | POA: Diagnosis not present

## 2015-10-14 DIAGNOSIS — M79661 Pain in right lower leg: Secondary | ICD-10-CM | POA: Insufficient documentation

## 2015-10-14 DIAGNOSIS — Z7982 Long term (current) use of aspirin: Secondary | ICD-10-CM | POA: Diagnosis not present

## 2015-10-14 DIAGNOSIS — R1084 Generalized abdominal pain: Secondary | ICD-10-CM | POA: Diagnosis not present

## 2015-10-14 DIAGNOSIS — R109 Unspecified abdominal pain: Secondary | ICD-10-CM | POA: Diagnosis present

## 2015-10-14 DIAGNOSIS — Z79899 Other long term (current) drug therapy: Secondary | ICD-10-CM | POA: Insufficient documentation

## 2015-10-14 DIAGNOSIS — R2243 Localized swelling, mass and lump, lower limb, bilateral: Secondary | ICD-10-CM | POA: Insufficient documentation

## 2015-10-14 DIAGNOSIS — Z792 Long term (current) use of antibiotics: Secondary | ICD-10-CM | POA: Insufficient documentation

## 2015-10-14 LAB — CBC WITH DIFFERENTIAL/PLATELET
Basophils Absolute: 0.1 10*3/uL (ref 0–0.1)
Basophils Relative: 1 %
EOS ABS: 0.4 10*3/uL (ref 0–0.7)
Eosinophils Relative: 5 %
HCT: 49.7 % — ABNORMAL HIGH (ref 35.0–47.0)
HEMOGLOBIN: 15.9 g/dL (ref 12.0–16.0)
LYMPHS ABS: 1.4 10*3/uL (ref 1.0–3.6)
Lymphocytes Relative: 17 %
MCH: 25.4 pg — AB (ref 26.0–34.0)
MCHC: 32 g/dL (ref 32.0–36.0)
MCV: 79.5 fL — ABNORMAL LOW (ref 80.0–100.0)
MONOS PCT: 1 %
Monocytes Absolute: 0.1 10*3/uL — ABNORMAL LOW (ref 0.2–0.9)
NEUTROS PCT: 76 %
Neutro Abs: 6.1 10*3/uL (ref 1.4–6.5)
Platelets: 800 10*3/uL — ABNORMAL HIGH (ref 150–440)
RBC: 6.25 MIL/uL — AB (ref 3.80–5.20)
RDW: 21.6 % — ABNORMAL HIGH (ref 11.5–14.5)
WBC: 8.1 10*3/uL (ref 3.6–11.0)

## 2015-10-14 LAB — COMPREHENSIVE METABOLIC PANEL
ALT: 8 U/L — AB (ref 14–54)
AST: 18 U/L (ref 15–41)
Albumin: 2.8 g/dL — ABNORMAL LOW (ref 3.5–5.0)
Alkaline Phosphatase: 85 U/L (ref 38–126)
Anion gap: 4 — ABNORMAL LOW (ref 5–15)
BUN: 9 mg/dL (ref 6–20)
CHLORIDE: 106 mmol/L (ref 101–111)
CO2: 27 mmol/L (ref 22–32)
CREATININE: 0.46 mg/dL (ref 0.44–1.00)
Calcium: 8.9 mg/dL (ref 8.9–10.3)
GFR calc non Af Amer: >60 mL/min (ref >60)
Glucose, Bld: 102 mg/dL — ABNORMAL HIGH (ref 65–99)
Potassium: 4.8 mmol/L (ref 3.5–5.1)
SODIUM: 137 mmol/L (ref 135–145)
Total Bilirubin: 0.8 mg/dL (ref 0.3–1.2)
Total Protein: 6.8 g/dL (ref 6.5–8.1)

## 2015-10-14 LAB — LIPASE, BLOOD: LIPASE: 16 U/L (ref 11–51)

## 2015-10-14 LAB — PROTIME-INR
INR: 1.93
PROTHROMBIN TIME: 22 s — AB (ref 11.4–15.0)

## 2015-10-14 NOTE — ED Notes (Signed)
Pt uprite on stretcher in darkened exam room watching TV with no distress noted; pt voices good understanding of d/c instructions and need for EMS transport home as she "cannot sit in car to ride home"; EMS called for transport

## 2015-10-14 NOTE — ED Provider Notes (Signed)
[facility] Emergency Department Provider Note  ____________________________________________  Time seen: 3:30 PM on arrival by EMS  I have reviewed the triage vital signs and the nursing notes.   HISTORY  Chief Complaint Abdominal Pain    HPI Yolanda Wells is a 81 y.o. female who complains of generalized abdominal pain since yesterday. Gradual onset, worsening, waxing and waning but constant. No change in her colostomy output, but has noted that there seems to be leakage around the site. No dysuria frequency urgency. Has chronic bilateral lower leg pain which is unchanged.  Follows up with hematology Dr. Mike Gip.   Past Medical History  Diagnosis Date  . Hypertension   . Deep venous thrombosis (HCC)     right lower extremity  . History of hysterectomy   . History of bilateral hip replacements   . Polycythemia vera ([place])   . Dependent edema   . Arthritis   . Collagen vascular disease ([place])   . Stroke [facility])     Pt reports she was told she had a stroke in may     Patient Active Problem List   Diagnosis Date Noted  . Leukocytosis 09/25/2015  . Chest pain 09/25/2015  . COPD exacerbation ([place]) 09/24/2015  . Right lower lobe pneumonia 09/24/2015  . Dyspnea 09/22/2015  . Elevated troponin 09/15/2015  . UTI (lower urinary tract infection) 07/16/2015  . Abdominal wall cellulitis 07/15/2015  . DVT (deep venous thrombosis) ([place]) 07/15/2015  . HTN (hypertension) 07/15/2015  . Polycythemia vera ([place]) 07/15/2015  . Dependent edema 07/15/2015  . Arthritis 07/15/2015  . Pulmonary emboli ([place]) 05/25/2015  . Diverticulosis of colon with hemorrhage 04/02/2015     Past Surgical History  Procedure Laterality Date  . Toe amputation      right  . Joint replacement      Left and Right Hip  . Abdominal hysterectomy    . Peripheral vascular catheterization N/A 04/02/2015    Procedure: Visceral Angiography;  Surgeon: Algernon Huxley, MD;  Location: [place] CV LAB;  Service: Cardiovascular;  Laterality: N/A;  . Peripheral vascular catheterization N/A 04/02/2015    Procedure: Visceral Artery Intervention;  Surgeon: Algernon Huxley, MD;  Location: [place] [facility];  Service: Cardiovascular;  Laterality: N/A;  . Colectomy with colostomy creation/hartmann procedure N/A 04/03/2015    Procedure: COLECTOMY WITH COLOSTOMY CREATION/HARTMANN PROCEDURE;  Surgeon: Sherri Rad, MD;  Location: [facility] ORS;  Service: General;  Laterality: N/A;  . Cardiac catheterization Right 04/03/2015    Procedure: CENTRAL LINE INSERTION;  Surgeon: Sherri Rad, MD;  Location: [facility] ORS;  Service: General;  Laterality: Right;  . Peripheral vascular catheterization N/A 05/25/2015    Procedure: IVC Filter Insertion;  Surgeon: Katha Cabal, MD;  Location: [place] CV LAB;  Service: Cardiovascular;  Laterality: N/A;     Current Outpatient Rx  Name  Route  Sig  Dispense  Refill  . aspirin EC 81 MG tablet   Oral   Take 1 tablet (81 mg total) by mouth daily.   30 tablet   2   . azithromycin (ZITHROMAX) 250 MG tablet   Oral   Take 1 tablet (250 mg total) by mouth daily.   4 each   0   . budesonide-formoterol (SYMBICORT) 160-4.5 MCG/ACT inhaler   Inhalation   Inhale 2 puffs into the lungs 2 (two) times daily.   1 Inhaler   12   . chlorpheniramine-HYDROcodone (TUSSIONEX) 10-8 MG/5ML SUER   Oral   Take 5  mLs by mouth every 12 (twelve) hours.   140 mL   0   . docusate sodium (COLACE) 100 MG capsule   Oral   Take 100 mg by mouth 2 (two) times daily.         . ferrous sulfate 325 (65 FE) MG tablet   Oral   Take 325 mg by mouth 2 (two) times daily with a meal.         . folic acid (FOLVITE) A999333 MCG tablet   Oral   Take 400 mcg by mouth daily.         . furosemide (LASIX) 20 MG tablet   Oral   Take 20 mg by mouth daily.         [name] guaiFENesin (MUCINEX) 600 MG 12 hr tablet   Oral   Take 1 tablet (600 mg total) by mouth 2 (two) times daily.   20  tablet   0   . ipratropium-albuterol (DUONEB) 0.5-2.5 (3) MG/3ML SOLN   Nebulization   Take 3 mLs by nebulization every 4 (four) hours.   360 mL   6   . menthol-cetylpyridinium (CEPACOL) 3 MG lozenge   Oral   Take 1 lozenge (3 mg total) by mouth as needed for sore throat.   100 tablet   12   . metoprolol tartrate (LOPRESSOR) 25 MG tablet   Oral   Take 1 tablet (25 mg total) by mouth 2 (two) times daily.   60 tablet   6   . mupirocin ointment (BACTROBAN) 2 %   Nasal   Place 1 application into the nose 2 (two) times daily.   22 g   0   . ondansetron (ZOFRAN) 4 MG tablet   Oral   Take 4 mg by mouth every 6 (six) hours as needed for nausea or vomiting.         [name] oxyCODONE-acetaminophen (PERCOCET/ROXICET) 5-325 MG tablet   Oral   Take 1 tablet by mouth every 6 (six) hours as needed for severe pain.   20 tablet   0   . potassium chloride SA (K-DUR,KLOR-CON) 20 MEQ tablet   Oral   Take 1 tablet (20 mEq total) by mouth 2 (two) times daily.   30 tablet   0   . simethicone (MYLICON) 80 MG chewable tablet   Oral   Chew 30 mg by mouth 4 (four) times daily -  before meals and at bedtime.         [name] warfarin (COUMADIN) 4 MG tablet   Oral   Take 1 tablet (4 mg total) by mouth daily at 6 PM.   15 tablet   0      Allergies Review of patient's allergies indicates no known allergies.   Family History  Problem Relation Age of Onset  . Heart attack Father   . Hypertension    . Arthritis-Osteo    . COPD Father   . Diabetes Brother   . Osteosarcoma Son     Social History Social History  Substance Use Topics  . Smoking status: Never Smoker   . Smokeless tobacco: Never Used  . Alcohol Use: No    Review of Systems  Constitutional:   No fever or chills. No weight changes Eyes:   No blurry vision or double vision.  ENT:   No sore throat. Cardiovascular:   No chest pain. Respiratory:   No dyspnea or cough. Gastrointestinal:   Positive for abdominal pain,  without vomiting and diarrhea.  No BRBPR or melena. Genitourinary:   Negative for dysuria, urinary retention, bloody urine, or difficulty urinating. Musculoskeletal:   Chronic bilateral lower extremity pain and swelling.. Skin:   Negative for rash. Neurological:   Negative for headaches, focal weakness or numbness. Psychiatric:  No anxiety or depression.   Endocrine:  No hot/cold intolerance, changes in energy, or sleep difficulty.  10-point ROS otherwise negative.  ____________________________________________   PHYSICAL EXAM:  VITAL SIGNS: ED Triage Vitals  Enc Vitals Group     BP 10/14/15 1538 140/87 mmHg     Pulse Rate 10/14/15 1538 80     Resp 10/14/15 1538 18     Temp 10/14/15 1538 98 F (36.7 C)     Temp src --      SpO2 10/14/15 1538 100 %     Weight 10/14/15 1541 211 lb 6.4 oz (95.89 kg)     Height 10/14/15 1541 5\' 5"  (1.651 m)     Head Cir --      Peak Flow --      Pain Score 10/14/15 1541 8     Pain Loc --      Pain Edu? --      Excl. in [place]? --      Constitutional:   Alert and oriented. Well appearing and in no distress. Eyes:   No scleral icterus. No conjunctival pallor. PERRL. EOMI ENT   Head:   Normocephalic and atraumatic.   Nose:   No congestion/rhinnorhea. No septal hematoma   Mouth/Throat:   MMM, no pharyngeal erythema. No peritonsillar mass. No uvula shift.   Neck:   No stridor. No SubQ emphysema. No meningismus. Hematological/Lymphatic/Immunilogical:   No cervical lymphadenopathy. Cardiovascular:   RRR. Normal and symmetric distal pulses are present in all extremities. No murmurs, rubs, or gallops. Respiratory:   Normal respiratory effort without tachypnea nor retractions. Breath sounds are clear and equal bilaterally. No wheezes/rales/rhonchi. Gastrointestinal:   Soft and nontender to palpation. No distention. There is no CVA tenderness.  No rebound, rigidity, or guarding. There is stool leakage under the colostomy wafer between the  sticker and the abdominal wall. With removal of the colostomy bag and cleansing of the skin, I can see that there are multiple areas of superficial skin denudation with slight oozing bleed due to skin breakdown from stool exposure. Genitourinary:   deferred Musculoskeletal:   Status post right TMA. 1+ edema bilateral lower extremities with glossy skin. No focal warmth tenderness drainage or wounds. Neurologic:   Normal speech and language.  CN 2-10 normal. Motor grossly intact. No pronator drift.  No gross focal neurologic deficits are appreciated.  Skin:    Skin is warm, dry and intact. No rash noted.  No petechiae, purpura, or bullae. Psychiatric:   Mood and affect are normal. Speech and behavior are normal. Patient exhibits appropriate insight and judgment.  ____________________________________________    LABS (pertinent positives/negatives) (all labs ordered are listed, but only abnormal results are displayed) Labs Reviewed  COMPREHENSIVE METABOLIC PANEL - Abnormal; Notable for the following:    Glucose, Bld 102 (*)    Albumin 2.8 (*)    ALT 8 (*)    Anion gap 4 (*)    All other components within normal limits  CBC WITH DIFFERENTIAL/PLATELET - Abnormal; Notable for the following:    RBC 6.25 (*)    HCT 49.7 (*)    MCV 79.5 (*)    MCH 25.4 (*)    RDW 21.6 (*)    Platelets 800 (*)  Monocytes Absolute 0.1 (*)    All other components within normal limits  PROTIME-INR - Abnormal; Notable for the following:    Prothrombin Time 22.0 (*)    All other components within normal limits  LIPASE, BLOOD   ____________________________________________   EKG    ____________________________________________    RADIOLOGY  X-ray abdomen 2 view unremarkable  ____________________________________________   PROCEDURES  Colostomy dressing with wafer and bag replaced, abdominal wall cleansed ____________________________________________   INITIAL IMPRESSION / ASSESSMENT AND PLAN  / ED COURSE  Pertinent labs & imaging results that were available during my care of the patient were reviewed by me and considered in my medical decision making (see chart for details).  Patient presents with abdominal pain around the colostomy site with stool leakage under the wafer. Exam otherwise reassuring. Colostomy bag was changed and the area was cleaned. X-ray and labs unremarkable given patient's history. Discharge home, follow up with primary care.     ____________________________________________   FINAL CLINICAL IMPRESSION(S) / ED DIAGNOSES  Final diagnoses:  Generalized abdominal pain  Colostomy care [facility])      Carrie Mew, MD 10/14/15 [phone]

## 2015-10-14 NOTE — ED Notes (Signed)
Patient brought in by [facility] c/o abdominal pain that stared 1 hour ago and also of her coloscopy bag leaking from around the site.

## 2015-10-14 NOTE — Discharge Instructions (Signed)
Abdominal Pain, Adult Many things can cause abdominal pain. Usually, abdominal pain is not caused by a disease and will improve without treatment. It can often be observed and treated at home. Your health care provider will do a physical exam and possibly order blood tests and X-rays to help determine the seriousness of your pain. However, in many cases, more time must pass before a clear cause of the pain can be found. Before that point, your health care provider may not know if you need more testing or further treatment. HOME CARE INSTRUCTIONS Monitor your abdominal pain for any changes. The following actions may help to alleviate any discomfort you are experiencing:  Only take over-the-counter or prescription medicines as directed by your health care provider.  Do not take laxatives unless directed to do so by your health care provider.  Try a clear liquid diet (broth, tea, or water) as directed by your health care provider. Slowly move to a bland diet as tolerated. SEEK MEDICAL CARE IF:  You have unexplained abdominal pain.  You have abdominal pain associated with nausea or diarrhea.  You have pain when you urinate or have a bowel movement.  You experience abdominal pain that wakes you in the night.  You have abdominal pain that is worsened or improved by eating food.  You have abdominal pain that is worsened with eating fatty foods.  You have a fever. SEEK IMMEDIATE MEDICAL CARE IF:  Your pain does not go away within 2 hours.  You keep throwing up (vomiting).  Your pain is felt only in portions of the abdomen, such as the right side or the left lower portion of the abdomen.  You pass bloody or black tarry stools. MAKE SURE YOU:  Understand these instructions.  Will watch your condition.  Will get help right away if you are not doing well or get worse.   This information is not intended to replace advice given to you by your health care provider. Make sure you discuss  any questions you have with your health care provider.   Document Released: 08/06/2005 Document Revised: 07/18/2015 Document Reviewed: 07/06/2013 Elsevier Interactive Patient Education 2016 [place] A colostomy is an opening for stool to leave your body when a medical condition prevents it from leaving through the usual opening (rectum). During a surgery, a piece of large intestine (colon) is brought through a hole in the abdominal wall. The new opening is called a stoma or ostomy. A bag or pouch fits over the stoma to catch stool and gas. Your stool may be liquid, somewhat pasty, or formed. CARING FOR YOUR STOMA  Normally, the stoma looks a lot like the inside of your cheek: pink, red, and moist. At first it may be swollen, but this swelling will decrease within 6 weeks. Keep the skin around your stoma clean and dry. You can gently wash your stoma and the skin around your stoma in the shower with a clean, soft washcloth. If you develop any skin irritation, your caregiver may give you a stoma powder or ointment to help heal the area. Do not use any products other than those specifically given to you by your caregiver.  Your stoma should not be uncomfortable. If you notice any stinging or burning, your pouch may be leaking, and the skin around your stoma may be coming into contact with stool. This can cause skin irritation. If you notice stinging, replace your pouch with a new one and discard the old  one. OSTOMY POUCHES  The pouch that fits over the ostomy can be made up of either 1 or 2 pieces. A one-piece pouch has a skin barrier piece and the pouch itself in one unit. A two-piece pouch has a skin barrier with a separate pouch that snaps on and off of the skin barrier. Either way, you should empty the pouch when it is only  to  full. Do not let more stool or gas build up. This could cause the pouch to leak. Some ostomy bags have a built-in gas release valve. Ostomy  deodorizer (5 drops) can be put into the pouch to prevent odor. Some people use ostomy lubricant drops inside the pouch to help the stool slide out of the bag more easily and completely.  EMPTYING YOUR OSTOMY POUCH  You may get lessons on how to empty your pouch from a wound-ostomy nurse before you leave the hospital. Here are the basic steps:  Wash your hands with soap and water.  Sit far back on the toilet.  Put several pieces of toilet paper into the toilet water. This will prevent splashing as you empty the stool into the toilet bowl.  Unclip or unvelcro the tail end of the pouch.  Unroll the tail and empty stool into the toilet.  Clean the tail with toilet paper.  Reroll the tail, and clip or velcro it closed.  Wash your hands again. CHANGING YOUR OSTOMY POUCH  Change your ostomy pouch about every 3 to 4 days for the first 6 weeks, then every 5 to7 days. Always change the bag sooner if there is any leakage or you begin to notice any discomfort or irritation of the skin around the stoma. When possible, plan to change your ostomy pouch before eating or drinking as this will lessen the chance of stool coming out during the pouch change. A wound-ostomy nurse may teach you how to change your pouch before you leave the hospital. Here are the basic steps:  Lay out your supplies.  Wash your hands with soap and water.  Carefully remove the old pouch.  Wash the stoma and allow it to dry. Men may be advised to shave any hair around the stoma very carefully. This will make the adhesive stick better.  Use the stoma measuring guide that comes with your pouch set to decide what size hole you will need to cut in the skin barrier piece. Choose the smallest possible size that will hold the stoma but will not touch it.  Use the guide to trace the circle on the back of the skin barrier piece. Cut out the hole.  Hold the skin barrier piece over the stoma to make sure the hole is the correct  size.  Remove the adhesive paper backing from the skin barrier piece.  Squeeze stoma paste around the opening of the skin barrier piece.  Clean and dry the skin around the stoma again.  Carefully fit the skin barrier piece over your stoma.  If you are using a two-piece pouch, snap the pouch onto the skin barrier piece.  Close the tail of the pouch.  Put your hand over the top of the skin barrier piece to help warm it for about 5 minutes, so that it conforms to your body better.  Wash your hands again. DIET TIPS   Continue to follow your usual diet.  Drink about eight 8 oz glasses of water each day.  You can prevent gas by eating slowly and chewing your  food thoroughly.  If you feel concerned that you have too much gas, you can cut back on gas-producing foods, such as:  Spicy foods.  Onions and garlic.  Cruciferous vegetables (cabbage, broccoli, cauliflower, Brussels sprouts).  Beans and legumes.  Some cheeses.  Eggs.  Fish.  Bubbly (carbonated) drinks.  Chewing gum. GENERAL TIPS   You can shower with or without the bag in place.  Always keep the bag on if you are bathing or swimming.  If your bag gets wet, you can dry it with a blow-dryer set to cool.  Avoid wearing tight clothing directly over your stoma so that it does not become irritated or bleed. Tight clothing can also prevent stool from draining into the pouch.  It is helpful to always have an extra skin barrier and pouch with you when traveling. Do not leave them anywhere too warm, as parts of them can melt.  Do not let your seat belt rest on your stoma. Try to keep the seat belt either above or below your stoma, or use a tiny pillow to cushion it.  You can still participate in sports, but you should avoid activities in which there is a risk of getting hit in the abdomen.  You can still have sex. It is a good idea to empty your pouch prior to sex. Some people and their partners feel very comfortable  seeing the pouch during sex. Others choose to wear lingerie or a T-shirt that covers the device. SEEK IMMEDIATE MEDICAL CARE IF:  You notice a change in the size or color of the stoma, especially if it becomes very red, purple, black, or pale white.  You have bloody stools or bleeding from the stoma.  You have abdominal pain, nausea, vomiting, or bloating.  There is anything unusual protruding from the stoma.  You have irritation or red skin around the stoma.  No stool is passing from the stoma.  You have diarrhea (requiring more frequent than normal pouch emptying).   This information is not intended to replace advice given to you by your health care provider. Make sure you discuss any questions you have with your health care provider.   Document Released: 10/30/2003 Document Revised: 01/19/2012 Document Reviewed: 03/26/2011 Elsevier Interactive Patient Education [employer].

## 2015-10-16 ENCOUNTER — Other Ambulatory Visit: Payer: Self-pay | Admitting: Hematology and Oncology

## 2015-10-16 ENCOUNTER — Inpatient Hospital Stay: Payer: Medicare Other | Attending: Hematology and Oncology

## 2015-10-16 ENCOUNTER — Inpatient Hospital Stay: Payer: Medicare Other

## 2015-10-16 DIAGNOSIS — Z86718 Personal history of other venous thrombosis and embolism: Secondary | ICD-10-CM | POA: Insufficient documentation

## 2015-10-16 DIAGNOSIS — D45 Polycythemia vera: Secondary | ICD-10-CM

## 2015-10-16 DIAGNOSIS — Z7982 Long term (current) use of aspirin: Secondary | ICD-10-CM | POA: Diagnosis not present

## 2015-10-16 DIAGNOSIS — Z7901 Long term (current) use of anticoagulants: Secondary | ICD-10-CM | POA: Diagnosis not present

## 2015-10-16 DIAGNOSIS — R600 Localized edema: Secondary | ICD-10-CM | POA: Insufficient documentation

## 2015-10-16 DIAGNOSIS — R5383 Other fatigue: Secondary | ICD-10-CM | POA: Diagnosis not present

## 2015-10-16 DIAGNOSIS — Z86711 Personal history of pulmonary embolism: Secondary | ICD-10-CM | POA: Diagnosis not present

## 2015-10-16 DIAGNOSIS — R233 Spontaneous ecchymoses: Secondary | ICD-10-CM | POA: Diagnosis not present

## 2015-10-16 DIAGNOSIS — Z933 Colostomy status: Secondary | ICD-10-CM | POA: Diagnosis not present

## 2015-10-16 DIAGNOSIS — Z9181 History of falling: Secondary | ICD-10-CM | POA: Diagnosis not present

## 2015-10-16 DIAGNOSIS — Z8673 Personal history of transient ischemic attack (TIA), and cerebral infarction without residual deficits: Secondary | ICD-10-CM | POA: Insufficient documentation

## 2015-10-16 DIAGNOSIS — N390 Urinary tract infection, site not specified: Secondary | ICD-10-CM | POA: Diagnosis not present

## 2015-10-16 DIAGNOSIS — R262 Difficulty in walking, not elsewhere classified: Secondary | ICD-10-CM | POA: Insufficient documentation

## 2015-10-16 DIAGNOSIS — K9409 Other complications of colostomy: Secondary | ICD-10-CM | POA: Insufficient documentation

## 2015-10-16 DIAGNOSIS — I10 Essential (primary) hypertension: Secondary | ICD-10-CM | POA: Insufficient documentation

## 2015-10-16 DIAGNOSIS — R0602 Shortness of breath: Secondary | ICD-10-CM | POA: Insufficient documentation

## 2015-10-16 DIAGNOSIS — Z79899 Other long term (current) drug therapy: Secondary | ICD-10-CM | POA: Insufficient documentation

## 2015-10-16 LAB — CBC WITH DIFFERENTIAL/PLATELET
Basophils Absolute: 0.1 10*3/uL (ref 0–0.1)
Basophils Relative: 1 %
Eosinophils Absolute: 0.3 10*3/uL (ref 0–0.7)
Eosinophils Relative: 5 %
HCT: 50.1 % — ABNORMAL HIGH (ref 35.0–47.0)
Hemoglobin: 16 g/dL (ref 12.0–16.0)
Lymphocytes Relative: 24 %
Lymphs Abs: 1.4 10*3/uL (ref 1.0–3.6)
MCH: 24.9 pg — ABNORMAL LOW (ref 26.0–34.0)
MCHC: 31.9 g/dL — ABNORMAL LOW (ref 32.0–36.0)
MCV: 78.1 fL — ABNORMAL LOW (ref 80.0–100.0)
Monocytes Absolute: 0.1 10*3/uL — ABNORMAL LOW (ref 0.2–0.9)
Monocytes Relative: 1 %
Neutro Abs: 4.2 10*3/uL (ref 1.4–6.5)
Neutrophils Relative %: 69 %
Platelets: 690 10*3/uL — ABNORMAL HIGH (ref 150–440)
RBC: 6.42 MIL/uL — ABNORMAL HIGH (ref 3.80–5.20)
RDW: 20.8 % — ABNORMAL HIGH (ref 11.5–14.5)
WBC: 6.1 10*3/uL (ref 3.6–11.0)

## 2015-10-19 ENCOUNTER — Other Ambulatory Visit: Payer: Self-pay

## 2015-10-19 DIAGNOSIS — D45 Polycythemia vera: Secondary | ICD-10-CM

## 2015-10-20 ENCOUNTER — Other Ambulatory Visit: Payer: Self-pay | Admitting: Hematology and Oncology

## 2015-10-23 ENCOUNTER — Other Ambulatory Visit: Payer: Self-pay | Admitting: Hematology and Oncology

## 2015-10-23 ENCOUNTER — Inpatient Hospital Stay: Payer: Medicare Other

## 2015-10-23 DIAGNOSIS — D45 Polycythemia vera: Secondary | ICD-10-CM | POA: Diagnosis not present

## 2015-10-23 LAB — CBC WITH DIFFERENTIAL/PLATELET
Basophils Absolute: 0 10*3/uL (ref 0–0.1)
Basophils Relative: 1 %
Eosinophils Absolute: 0.3 10*3/uL (ref 0–0.7)
Eosinophils Relative: 6 %
HCT: 50.8 % — ABNORMAL HIGH (ref 35.0–47.0)
Hemoglobin: 16 g/dL (ref 12.0–16.0)
Lymphocytes Relative: 29 %
Lymphs Abs: 1.4 10*3/uL (ref 1.0–3.6)
MCH: 24.8 pg — ABNORMAL LOW (ref 26.0–34.0)
MCHC: 31.4 g/dL — ABNORMAL LOW (ref 32.0–36.0)
MCV: 78.9 fL — ABNORMAL LOW (ref 80.0–100.0)
Monocytes Absolute: 0.2 10*3/uL (ref 0.2–0.9)
Monocytes Relative: 4 %
Neutro Abs: 2.9 10*3/uL (ref 1.4–6.5)
Neutrophils Relative %: 60 %
Platelets: 410 10*3/uL (ref 150–440)
RBC: 6.44 MIL/uL — ABNORMAL HIGH (ref 3.80–5.20)
RDW: 22.1 % — ABNORMAL HIGH (ref 11.5–14.5)
WBC: 4.8 10*3/uL (ref 3.6–11.0)

## 2015-10-28 ENCOUNTER — Emergency Department
Admission: EM | Admit: 2015-10-28 | Discharge: 2015-10-28 | Disposition: A | Discharge status: Home / Self Care | Payer: Medicare Other | Attending: Emergency Medicine | Admitting: Emergency Medicine

## 2015-10-28 ENCOUNTER — Emergency Department: Payer: Medicare Other

## 2015-10-28 DIAGNOSIS — Z7901 Long term (current) use of anticoagulants: Secondary | ICD-10-CM | POA: Diagnosis not present

## 2015-10-28 DIAGNOSIS — Z7982 Long term (current) use of aspirin: Secondary | ICD-10-CM | POA: Insufficient documentation

## 2015-10-28 DIAGNOSIS — I10 Essential (primary) hypertension: Secondary | ICD-10-CM | POA: Insufficient documentation

## 2015-10-28 DIAGNOSIS — M25552 Pain in left hip: Secondary | ICD-10-CM | POA: Diagnosis not present

## 2015-10-28 DIAGNOSIS — Z79899 Other long term (current) drug therapy: Secondary | ICD-10-CM | POA: Diagnosis not present

## 2015-10-28 DIAGNOSIS — Z7951 Long term (current) use of inhaled steroids: Secondary | ICD-10-CM | POA: Diagnosis not present

## 2015-10-28 DIAGNOSIS — Z792 Long term (current) use of antibiotics: Secondary | ICD-10-CM | POA: Insufficient documentation

## 2015-10-28 MED ORDER — OXYCODONE-ACETAMINOPHEN 5-325 MG PO TABS
ORAL_TABLET | ORAL | Status: AC
  Filled 2015-10-28: qty 1

## 2015-10-28 MED ORDER — OXYCODONE-ACETAMINOPHEN 5-325 MG PO TABS
1.0000 | ORAL_TABLET | Freq: Once | ORAL | Status: AC
  Administered 2015-10-28: 1 via ORAL

## 2015-10-28 NOTE — Discharge Instructions (Signed)
Please follow-up with your doctor in the next 1-2 days for recheck. Please follow-up with orthopedics by calling the number provided if he continued to have any discomfort in the left hip. Please take Tylenol as needed for discomfort, as written on the box.   Hip Pain Your hip is the joint between your upper legs and your lower pelvis. The bones, cartilage, tendons, and muscles of your hip joint perform a lot of work each day supporting your body weight and allowing you to move around. Hip pain can range from a minor ache to severe pain in one or both of your hips. Pain may be felt on the inside of the hip joint near the groin, or the outside near the buttocks and upper thigh. You may have swelling or stiffness as well.  HOME CARE INSTRUCTIONS   Take medicines only as directed by your health care provider.  Apply ice to the injured area:  Put ice in a plastic bag.  Place a towel between your skin and the bag.  Leave the ice on for 15-20 minutes at a time, 3-4 times a day.  Keep your leg raised (elevated) when possible to lessen swelling.  Avoid activities that cause pain.  Follow specific exercises as directed by your health care provider.  Sleep with a pillow between your legs on your most comfortable side.  Record how often you have hip pain, the location of the pain, and what it feels like. SEEK MEDICAL CARE IF:   You are unable to put weight on your leg.  Your hip is red or swollen or very tender to touch.  Your pain or swelling continues or worsens after 1 week.  You have increasing difficulty walking.  You have a fever. SEEK IMMEDIATE MEDICAL CARE IF:   You have fallen.  You have a sudden increase in pain and swelling in your hip. MAKE SURE YOU:   Understand these instructions.  Will watch your condition.  Will get help right away if you are not doing well or get worse.   This information is not intended to replace advice given to you by your health care  provider. Make sure you discuss any questions you have with your health care provider.   Document Released: 04/16/2010 Document Revised: 11/17/2014 Document Reviewed: 06/23/2013 Elsevier Interactive Patient Education [employer].

## 2015-10-28 NOTE — ED Provider Notes (Signed)
[facility] Emergency Department Provider Note  Time seen: 7:05 PM  I have reviewed the triage vital signs and the nursing notes.   HISTORY  Chief Complaint Hip Pain    HPI Yolanda Wells is a 81 y.o. female with a past medical history of hypertension, DVT, arthritis, CVA, who presents the emergency department left hip pain. According to the patient she was working with physical therapy to begin standing and pivoting. States she was standing when she began experiencing left hip pain. Denies any trauma or falls. States the hip pain is mild currently.     Past Medical History  Diagnosis Date  . Hypertension   . Deep venous thrombosis (HCC)     right lower extremity  . History of hysterectomy   . History of bilateral hip replacements   . Polycythemia vera ([place])   . Dependent edema   . Arthritis   . Collagen vascular disease ([place])   . Stroke [facility])     Pt reports she was told she had a stroke in may    Patient Active Problem List   Diagnosis Date Noted  . Leukocytosis 09/25/2015  . Chest pain 09/25/2015  . COPD exacerbation ([place]) 09/24/2015  . Right lower lobe pneumonia 09/24/2015  . Dyspnea 09/22/2015  . Elevated troponin 09/15/2015  . UTI (lower urinary tract infection) 07/16/2015  . Abdominal wall cellulitis 07/15/2015  . DVT (deep venous thrombosis) ([place]) 07/15/2015  . HTN (hypertension) 07/15/2015  . Polycythemia vera ([place]) 07/15/2015  . Dependent edema 07/15/2015  . Arthritis 07/15/2015  . Pulmonary emboli ([place]) 05/25/2015  . Diverticulosis of colon with hemorrhage 04/02/2015    Past Surgical History  Procedure Laterality Date  . Toe amputation      right  . Joint replacement      Left and Right Hip  . Abdominal hysterectomy    . Peripheral vascular catheterization N/A 04/02/2015    Procedure: Visceral Angiography;  Surgeon: Algernon Huxley, MD;  Location: [place] CV LAB;  Service: Cardiovascular;  Laterality: N/A;  . Peripheral  vascular catheterization N/A 04/02/2015    Procedure: Visceral Artery Intervention;  Surgeon: Algernon Huxley, MD;  Location: [place] CV LAB;  Service: Cardiovascular;  Laterality: N/A;  . Colectomy with colostomy creation/hartmann procedure N/A 04/03/2015    Procedure: COLECTOMY WITH COLOSTOMY CREATION/HARTMANN PROCEDURE;  Surgeon: Sherri Rad, MD;  Location: [facility] ORS;  Service: General;  Laterality: N/A;  . Cardiac catheterization Right 04/03/2015    Procedure: CENTRAL LINE INSERTION;  Surgeon: Sherri Rad, MD;  Location: [facility] ORS;  Service: General;  Laterality: Right;  . Peripheral vascular catheterization N/A 05/25/2015    Procedure: IVC Filter Insertion;  Surgeon: Katha Cabal, MD;  Location: [place] CV LAB;  Service: Cardiovascular;  Laterality: N/A;    Current Outpatient Rx  Name  Route  Sig  Dispense  Refill  . aspirin EC 81 MG tablet   Oral   Take 1 tablet (81 mg total) by mouth daily.   30 tablet   2   . azithromycin (ZITHROMAX) 250 MG tablet   Oral   Take 1 tablet (250 mg total) by mouth daily.   4 each   0   . budesonide-formoterol (SYMBICORT) 160-4.5 MCG/ACT inhaler   Inhalation   Inhale 2 puffs into the lungs 2 (two) times daily.   1 Inhaler   12   . chlorpheniramine-HYDROcodone (TUSSIONEX) 10-8 MG/5ML SUER   Oral   Take 5 mLs by mouth every 12 (  twelve) hours.   140 mL   0   . docusate sodium (COLACE) 100 MG capsule   Oral   Take 100 mg by mouth 2 (two) times daily.         . ferrous sulfate 325 (65 FE) MG tablet   Oral   Take 325 mg by mouth 2 (two) times daily with a meal.         . folic acid (FOLVITE) [id number] MCG tablet   Oral   Take 400 mcg by mouth daily.         . furosemide (LASIX) 20 MG tablet   Oral   Take 20 mg by mouth daily.         [name] guaiFENesin (MUCINEX) 600 MG 12 hr tablet   Oral   Take 1 tablet (600 mg total) by mouth 2 (two) times daily.   20 tablet   0   . ipratropium-albuterol (DUONEB) 0.5-2.5 (3) MG/3ML SOLN    Nebulization   Take 3 mLs by nebulization every 4 (four) hours.   360 mL   6   . menthol-cetylpyridinium (CEPACOL) 3 MG lozenge   Oral   Take 1 lozenge (3 mg total) by mouth as needed for sore throat.   100 tablet   12   . metoprolol tartrate (LOPRESSOR) 25 MG tablet   Oral   Take 1 tablet (25 mg total) by mouth 2 (two) times daily.   60 tablet   6   . mupirocin ointment (BACTROBAN) 2 %   Nasal   Place 1 application into the nose 2 (two) times daily.   22 g   0   . ondansetron (ZOFRAN) 4 MG tablet   Oral   Take 4 mg by mouth every 6 (six) hours as needed for nausea or vomiting.         [name] oxyCODONE-acetaminophen (PERCOCET/ROXICET) 5-325 MG tablet   Oral   Take 1 tablet by mouth every 6 (six) hours as needed for severe pain.   20 tablet   0   . potassium chloride SA (K-DUR,KLOR-CON) 20 MEQ tablet   Oral   Take 1 tablet (20 mEq total) by mouth 2 (two) times daily.   30 tablet   0   . simethicone (MYLICON) 80 MG chewable tablet   Oral   Chew 30 mg by mouth 4 (four) times daily -  before meals and at bedtime.         [name] warfarin (COUMADIN) 4 MG tablet   Oral   Take 1 tablet (4 mg total) by mouth daily at 6 PM.   15 tablet   0     Allergies Review of patient's allergies indicates no known allergies.  Family History  Problem Relation Age of Onset  . Heart attack Father   . Hypertension    . Arthritis-Osteo    . COPD Father   . Diabetes Brother   . Osteosarcoma Son     Social History Social History  Substance Use Topics  . Smoking status: Never Smoker   . Smokeless tobacco: Never Used  . Alcohol Use: No    Review of Systems Constitutional: Negative for fever. Cardiovascular: Negative for chest pain. Respiratory: Negative for shortness of breath. Gastrointestinal: Negative for abdominal pain Musculoskeletal: Positive for left hip pain. Neurological: Negative for headache 10-point ROS otherwise  negative.  ____________________________________________   PHYSICAL EXAM:  VITAL SIGNS: ED Triage Vitals  Enc Vitals Group     BP 10/28/15 1658 119/57  mmHg     Pulse Rate 10/28/15 1658 77     Resp 10/28/15 1658 18     Temp 10/28/15 1658 98.1 F (36.7 C)     Temp Source 10/28/15 1658 Oral     SpO2 10/28/15 1658 96 %     Weight 10/28/15 1658 210 lb (95.255 kg)     Height 10/28/15 1658 5\' 7"  (1.702 m)     Head Cir --      Peak Flow --      Pain Score 10/28/15 1659 9     Pain Loc --      Pain Edu? --      Excl. in [place]? --     Constitutional: Alert and oriented. Well appearing and in no distress. Eyes: Normal exam ENT   Head: Normocephalic and atraumatic.   Mouth/Throat: Mucous membranes are moist. Cardiovascular: Normal rate, regular rhythm. No murmur Respiratory: Normal respiratory effort without tachypnea nor retractions. Breath sounds are clear Gastrointestinal: Soft and nontender. No distention.  Musculoskeletal: No tenderness elicited in the left hip. Good range of motion in left hip. Good range of motion in the knee. Overall normal examination. Patient does have lower extremity edema, but no Tenderness. Skin darkening consistent with chronic changes. No erythema or signs of infection. Neurologic:  Normal speech and language. No gross focal neurologic deficits Skin:  Skin is warm, dry and intact.  Psychiatric: Mood and affect are normal. Speech and behavior are normal.   ____________________________________________       RADIOLOGY  Hip x-ray shows no acute abnormalities   INITIAL IMPRESSION / ASSESSMENT AND PLAN / ED COURSE  Pertinent labs & imaging results that were available during my care of the patient were reviewed by me and considered in my medical decision making (see chart for details).  Patient presents with left-sided hip pain while attempting to stand and shift. Patient appears well in the emergency department, nontender examination. X-ray  appears normal. Patient states she is mainly bed bound but just recently began learning to stand to pivot which she is currently working with physical therapy. We will discharge the patient home with Tylenol as needed for discomfort, and PCP follow-up. I discussed this plan. The patient is agreeable.  ____________________________________________   FINAL CLINICAL IMPRESSION(S) / ED DIAGNOSES  Left hip pain   Harvest Dark, MD 10/28/15 Einar Crow

## 2015-10-28 NOTE — ED Notes (Signed)
Pt ems pt having left hip pain - no injury. Is learning to stand and pivot and today had pain

## 2015-10-29 ENCOUNTER — Telehealth: Payer: Self-pay | Admitting: Surgery

## 2015-10-29 NOTE — Telephone Encounter (Signed)
Please call patients granddaughter, [name] @ [phone].  Patient had colectomy with colostomy creation with Dr Marina Gravel on 04/03/2015 - she is having a lot of trouble with the colostomy bag breaking, leaking and staying connected and it is having to be changed at least 4 times a week.  [facility] comes out, but they stated to patients granddaughter, [name], that they only come to order her supplies and that it wasn't their responsibility to change patients colostomy bag. So the granddaughter comes over to change her bag, but with all the problems patient is having with the colostomy now and [facility] not helping the granddaughter to show her how to maybe do this properly, she doesn't know what to do. She wanted to speak with the nurse to see if maybe you could give her some advice on the colostomy bag or if there is someone with [facility] or another agency that can come out to help.   Call [phone]

## 2015-10-30 ENCOUNTER — Inpatient Hospital Stay: Payer: Medicare Other

## 2015-10-30 ENCOUNTER — Inpatient Hospital Stay (HOSPITAL_BASED_OUTPATIENT_CLINIC_OR_DEPARTMENT_OTHER): Payer: Medicare Other | Admitting: Hematology and Oncology

## 2015-10-30 ENCOUNTER — Telehealth: Payer: Self-pay

## 2015-10-30 ENCOUNTER — Encounter: Payer: Self-pay | Admitting: Hematology and Oncology

## 2015-10-30 ENCOUNTER — Ambulatory Visit: Payer: Medicare Other

## 2015-10-30 VITALS — BP 137/85 | HR 82 | Temp 97.8°F | Resp 18

## 2015-10-30 DIAGNOSIS — R0602 Shortness of breath: Secondary | ICD-10-CM

## 2015-10-30 DIAGNOSIS — Z7901 Long term (current) use of anticoagulants: Secondary | ICD-10-CM

## 2015-10-30 DIAGNOSIS — K9409 Other complications of colostomy: Secondary | ICD-10-CM

## 2015-10-30 DIAGNOSIS — R5383 Other fatigue: Secondary | ICD-10-CM

## 2015-10-30 DIAGNOSIS — R600 Localized edema: Secondary | ICD-10-CM | POA: Diagnosis not present

## 2015-10-30 DIAGNOSIS — Z79899 Other long term (current) drug therapy: Secondary | ICD-10-CM

## 2015-10-30 DIAGNOSIS — R233 Spontaneous ecchymoses: Secondary | ICD-10-CM

## 2015-10-30 DIAGNOSIS — I2699 Other pulmonary embolism without acute cor pulmonale: Secondary | ICD-10-CM

## 2015-10-30 DIAGNOSIS — D45 Polycythemia vera: Secondary | ICD-10-CM

## 2015-10-30 DIAGNOSIS — N390 Urinary tract infection, site not specified: Secondary | ICD-10-CM

## 2015-10-30 DIAGNOSIS — R262 Difficulty in walking, not elsewhere classified: Secondary | ICD-10-CM

## 2015-10-30 DIAGNOSIS — Z7982 Long term (current) use of aspirin: Secondary | ICD-10-CM

## 2015-10-30 DIAGNOSIS — I10 Essential (primary) hypertension: Secondary | ICD-10-CM

## 2015-10-30 LAB — CBC WITH DIFFERENTIAL/PLATELET
Basophils Absolute: 0.1 10*3/uL (ref 0–0.1)
Basophils Relative: 1 %
Eosinophils Absolute: 0.4 10*3/uL (ref 0–0.7)
Eosinophils Relative: 6 %
HCT: 49.2 % — ABNORMAL HIGH (ref 35.0–47.0)
Hemoglobin: 15.4 g/dL (ref 12.0–16.0)
Lymphocytes Relative: 17 %
Lymphs Abs: 1.4 10*3/uL (ref 1.0–3.6)
MCH: 24.7 pg — ABNORMAL LOW (ref 26.0–34.0)
MCHC: 31.4 g/dL — ABNORMAL LOW (ref 32.0–36.0)
MCV: 78.7 fL — ABNORMAL LOW (ref 80.0–100.0)
Monocytes Absolute: 0.3 10*3/uL (ref 0.2–0.9)
Monocytes Relative: 3 %
Neutro Abs: 5.6 10*3/uL (ref 1.4–6.5)
Neutrophils Relative %: 73 %
Platelets: 960 10*3/uL (ref 150–440)
RBC: 6.25 MIL/uL — ABNORMAL HIGH (ref 3.80–5.20)
RDW: 22.5 % — ABNORMAL HIGH (ref 11.5–14.5)
WBC: 7.8 10*3/uL (ref 3.6–11.0)

## 2015-10-30 LAB — PROTIME-INR
INR: 2.41
Prothrombin Time: 26 seconds — ABNORMAL HIGH (ref 11.4–15.0)

## 2015-10-30 LAB — COMPREHENSIVE METABOLIC PANEL
ALT: 8 U/L — ABNORMAL LOW (ref 14–54)
AST: 15 U/L (ref 15–41)
Albumin: 3.1 g/dL — ABNORMAL LOW (ref 3.5–5.0)
Alkaline Phosphatase: 85 U/L (ref 38–126)
Anion gap: 5 (ref 5–15)
BUN: 11 mg/dL (ref 6–20)
CO2: 24 mmol/L (ref 22–32)
Calcium: 8.4 mg/dL — ABNORMAL LOW (ref 8.9–10.3)
Chloride: 104 mmol/L (ref 101–111)
Creatinine, Ser: 0.44 mg/dL (ref 0.44–1.00)
GFR calc Af Amer: >60 mL/min (ref >60)
GFR calc non Af Amer: >60 mL/min (ref >60)
Glucose, Bld: 98 mg/dL (ref 65–99)
Potassium: 4 mmol/L (ref 3.5–5.1)
Sodium: 133 mmol/L — ABNORMAL LOW (ref 135–145)
Total Bilirubin: 0.6 mg/dL (ref 0.3–1.2)
Total Protein: 7.2 g/dL (ref 6.5–8.1)

## 2015-10-30 LAB — FERRITIN: Ferritin: 14 ng/mL (ref 11–307)

## 2015-10-30 NOTE — Telephone Encounter (Signed)
Spoke with [name] at [facility] at this time. Pt on schedule for Thursday for a skilled nursing visit with a Colostomy nurse to help achieve the best seal possible and ensure that the supplies being received  Are the best for this patient and ostomy placement.  Spoke with Miss Yolanda Wells, who is very thankful that this visit has been set-up and will continue to work with [facility] to try and learn the best way to change ostomy with minimal difficulty. She states that she is not very satisfied with their services at this time. Encouraged Miss Yolanda Wells to see what Ostomy nurse has to say and then if she is still having difficulty, I would be glad to assist her in changing to another company.

## 2015-10-30 NOTE — Telephone Encounter (Signed)
Call made to [place] at this time. Spoke with Colletta Maryland who will have a Care manager call me back. Informed her that I would get back to her if she calls during clinic, to just leave name and direct number with office staff. Awaiting call back at this time.

## 2015-10-30 NOTE — Telephone Encounter (Signed)
Platelet count called of between [phone] by Maudie Mercury in Lab.  MD notified immediately   Will see pt in clinic next today.  No concerns noted.

## 2015-10-30 NOTE — Progress Notes (Signed)
[place] [facility] day:  10/30/2015  Chief Complaint: Yolanda Wells is a 81 y.o. female with polycythemia rubra vera (PV) who is seen for initial assessment after recent hospitalization.  HPI:   The patient was last seen in the medical oncology clinic on 10/02/2015.  At that time, she was seen for reassessment after interval hospitalization.  Hematocrit was 55.4 with a hemoglobin of 17 on 09/22/2015.   Platelet count was 1.29 million on admission. White count had fluctuated slightly between 23,000 and 28,000.  She underwent phlebotomy of 250 cc on 09/23/2015.  She was discharged on oral iron (inadvertantly).   Labs at last visit included a hematocrit of 54, hemoglobin 16.9, platelets 1.4 million, and WBC 31,800 with an ANC of 28,800.  She underwent phlebotomy of 300 cc.  She was started on hydroxyurea 1000 mg a day.  She has weekly CBCs with phlebotomies if her hematocrit was > 42. She has undergone weekly phlebotomies except for once (transportation issue).  Hematocrit has hovered around 50, WBC 4800-8100, and platelets 410,000-800,000.  She notes that she has difficulty ambulating secondary to her knee.  On 10/28/2015, her left knee gave out (prosthesis).  She was picked up by an ambulance and seen in the ER.  X-rays were negative.She states that she needs help with her activities of daily living.  She is having colostomy trouble (appliance not adhering).  [facility] is coming out on 11/01/2015 to help.    Her INR has been fluctuating.  Currently she is taking 3 mg on Mondays and Thursdays, 4 mg on Tuesdays, Fridays, Saturdays, and Sundays, and 5.5 mg on Wednesdays.  She notes that she bruises "everywhere", but denies any bleeding.  She has been on an antibiotic for 5 days secondary to a UTI.  She presented with urinary frequency.    Past Medical History  Diagnosis Date  . Hypertension   . Deep venous thrombosis (HCC)     right lower extremity  .  History of hysterectomy   . History of bilateral hip replacements   . Polycythemia vera ([place])   . Dependent edema   . Arthritis   . Collagen vascular disease ([place])   . Stroke [facility])     Pt reports she was told she had a stroke in may    Past Surgical History  Procedure Laterality Date  . Toe amputation      right  . Joint replacement      Left and Right Hip  . Abdominal hysterectomy    . Peripheral vascular catheterization N/A 04/02/2015    Procedure: Visceral Angiography;  Surgeon: Algernon Huxley, MD;  Location: [place] CV LAB;  Service: Cardiovascular;  Laterality: N/A;  . Peripheral vascular catheterization N/A 04/02/2015    Procedure: Visceral Artery Intervention;  Surgeon: Algernon Huxley, MD;  Location: [place] CV LAB;  Service: Cardiovascular;  Laterality: N/A;  . Colectomy with colostomy creation/hartmann procedure N/A 04/03/2015    Procedure: COLECTOMY WITH COLOSTOMY CREATION/HARTMANN PROCEDURE;  Surgeon: Sherri Rad, MD;  Location: [facility] ORS;  Service: General;  Laterality: N/A;  . Cardiac catheterization Right 04/03/2015    Procedure: CENTRAL LINE INSERTION;  Surgeon: Sherri Rad, MD;  Location: [facility] ORS;  Service: General;  Laterality: Right;  . Peripheral vascular catheterization N/A 05/25/2015    Procedure: IVC Filter Insertion;  Surgeon: Katha Cabal, MD;  Location: [place] CV LAB;  Service: Cardiovascular;  Laterality: N/A;    Family History  Problem  Relation Age of Onset  . Heart attack Father   . Hypertension    . Arthritis-Osteo    . COPD Father   . Diabetes Brother   . Osteosarcoma Son     Social History:  reports that she has never smoked. She has never used smokeless tobacco. She reports that she does not drink alcohol or use illicit drugs.  She lives alone on [address] at [place] (independent living). Her grand-daughter visits regularly Yolanda Wells [phone]).  The patient is accompanied by her grand-daughter today.  Allergies: No Known  Allergies  Current Medications: Current Outpatient Prescriptions  Medication Sig Dispense Refill  . aspirin EC 81 MG tablet Take 1 tablet (81 mg total) by mouth daily. 30 tablet 2  . budesonide-formoterol (SYMBICORT) 160-4.5 MCG/ACT inhaler Inhale 2 puffs into the lungs 2 (two) times daily. 1 Inhaler 12  . cephALEXin (KEFLEX) 250 MG capsule Take 250 mg by mouth 4 (four) times daily.    . chlorpheniramine-HYDROcodone (TUSSIONEX) 10-8 MG/5ML SUER Take 5 mLs by mouth every 12 (twelve) hours. 140 mL 0  . docusate sodium (COLACE) 100 MG capsule Take 100 mg by mouth 2 (two) times daily.    . ferrous sulfate 325 (65 FE) MG tablet Take 325 mg by mouth 2 (two) times daily with a meal.    . folic acid (FOLVITE) 889 MCG tablet Take 400 mcg by mouth daily.    . furosemide (LASIX) 20 MG tablet Take 20 mg by mouth daily.    [name] guaiFENesin (MUCINEX) 600 MG 12 hr tablet Take 1 tablet (600 mg total) by mouth 2 (two) times daily. 20 tablet 0  . hydroxyurea (HYDREA) 500 MG capsule Take 2 capsules by mouth every Tuesday, Thursday, Saturday, and Sunday.     . hydroxyurea (HYDREA) 500 MG capsule Take 1,500 mg by mouth every Monday, Wednesday, and Friday. May take with food to minimize GI side effects.    [name] ipratropium-albuterol (DUONEB) 0.5-2.5 (3) MG/3ML SOLN Take 3 mLs by nebulization every 4 (four) hours. 360 mL 6  . menthol-cetylpyridinium (CEPACOL) 3 MG lozenge Take 1 lozenge (3 mg total) by mouth as needed for sore throat. 100 tablet 12  . metoprolol tartrate (LOPRESSOR) 25 MG tablet Take 1 tablet (25 mg total) by mouth 2 (two) times daily. 60 tablet 6  . mupirocin ointment (BACTROBAN) 2 % Place 1 application into the nose 2 (two) times daily. 22 g 0  . ondansetron (ZOFRAN) 4 MG tablet Take 4 mg by mouth every 6 (six) hours as needed for nausea or vomiting.    [name] oxyCODONE-acetaminophen (PERCOCET/ROXICET) 5-325 MG tablet Take 1 tablet by mouth every 6 (six) hours as needed for severe pain. 20 tablet 0  .  potassium chloride SA (K-DUR,KLOR-CON) 20 MEQ tablet Take 1 tablet (20 mEq total) by mouth 2 (two) times daily. 30 tablet 0  . simethicone (MYLICON) 80 MG chewable tablet Chew 30 mg by mouth 4 (four) times daily -  before meals and at bedtime.    [name] warfarin (COUMADIN) 4 MG tablet Take 1 tablet (4 mg total) by mouth daily at 6 PM. 15 tablet 0   No current facility-administered medications for this visit.    Review of Systems:  GENERAL:  Fatigue.  No fevers, sweats or weight loss. PERFORMANCE STATUS (ECOG):  3 HEENT:  No visual changes, runny nose, sore throat, mouth sores or tenderness. Lungs:  Mild chronic shortness of breath.  No cough.  No hemoptysis. Cardiac:  No chest pain,  palpitations, orthopnea, or PND. GI:  No nausea, vomiting, diarrhea, constipation, melena or hematochezia. GU:  No urgency, frequency, dysuria, or hematuria. Musculoskeletal:  Knee problems.  No muscle tenderness. Extremities:  Chronic swelling in legs. Skin:  No rashes or skin changes. Neuro:  No headache, numbness or weakness, balance or coordination issues. Endocrine:  No diabetes, thyroid issues, hot flashes or night sweats. Psych:  No mood changes, depression or anxiety. Pain:  No focal pain. Review of systems:  All other systems reviewed and found to be negative.  Physical Exam: Blood pressure 137/85, pulse 82, temperature 97.8 F (36.6 C), temperature source Tympanic, resp. rate 18. GENERAL:  Elderly woman sitting comfortably in a wheelchair in the exam room in no acute distress. MENTAL STATUS:  Alert and oriented to person, place and time. HEAD:  Wearing a multi-colored hat.  Gray hair.  Normocephalic, atraumatic, face symmetric, no Cushingoid features. EYES:  Brown eyes.  Pupils equal round and reactive to light and accomodation.  No conjunctivitis or scleral icterus. ENT:  Oropharynx clear without lesion.  Upper dentures.  Tongue normal. Mucous membranes moist.  RESPIRATORY:  Decreased breath sounds  right base.  Clear to auscultation without rales, wheezes or rhonchi. CARDIOVASCULAR:  Regular rate and rhythm without murmur, rub or gallop. ABDOMEN:  Left sided colostomy.  Soft, non-tender, with active bowel sounds, and no appreciable hepatosplenomegaly.  No masses. SKIN:  Ecchymosis left hand.  No rashes, ulcers or lesions. EXTREMITIES: Slight swelling left lower extremity.  No skin discoloration or tenderness.  No palpable cords. LYMPH NODES: No palpable cervical, supraclavicular, axillary or inguinal adenopathy  NEUROLOGICAL: Unremarkable. PSYCH:  Appropriate.  Appointment on 10/30/2015  Component Date Value Ref Range Status  . WBC 10/30/2015 7.8  3.6 - 11.0 K/uL Final  . RBC 10/30/2015 6.25* 3.80 - 5.20 MIL/uL Final  . Hemoglobin 10/30/2015 15.4  12.0 - 16.0 g/dL Final  . HCT 10/30/2015 49.2* 35.0 - 47.0 % Final  . MCV 10/30/2015 78.7* 80.0 - 100.0 fL Final  . MCH 10/30/2015 24.7* 26.0 - 34.0 pg Final  . MCHC 10/30/2015 31.4* 32.0 - 36.0 g/dL Final  . RDW 10/30/2015 22.5* 11.5 - 14.5 % Final  . Platelets 10/30/2015 960* 150 - 440 K/uL Final   Comment: RESULT REPEATED AND VERIFIED PLATELET COUNT CONFIRMED BY SMEAR CRITICAL RESULT CALLED TO, READ BACK BY AND VERIFIED WITH: BRANDI MOYA AT 5852 10/30/2015 KMR   . Neutrophils Relative % 10/30/2015 73%   Final  . Neutro Abs 10/30/2015 5.6  1.4 - 6.5 K/uL Final  . Lymphocytes Relative 10/30/2015 17%   Final  . Lymphs Abs 10/30/2015 1.4  1.0 - 3.6 K/uL Final  . Monocytes Relative 10/30/2015 3%   Final  . Monocytes Absolute 10/30/2015 0.3  0.2 - 0.9 K/uL Final  . Eosinophils Relative 10/30/2015 6%   Final  . Eosinophils Absolute 10/30/2015 0.4  0 - 0.7 K/uL Final  . Basophils Relative 10/30/2015 1%   Final  . Basophils Absolute 10/30/2015 0.1  0 - 0.1 K/uL Final  . Sodium 10/30/2015 133* 135 - 145 mmol/L Final  . Potassium 10/30/2015 4.0  3.5 - 5.1 mmol/L Final  . Chloride 10/30/2015 104  101 - 111 mmol/L Final  . CO2 10/30/2015  24  22 - 32 mmol/L Final  . Glucose, Bld 10/30/2015 98  65 - 99 mg/dL Final  . BUN 10/30/2015 11  6 - 20 mg/dL Final  . Creatinine, Ser 10/30/2015 0.44  0.44 - 1.00 mg/dL Final  .  Calcium 10/30/2015 8.4* 8.9 - 10.3 mg/dL Final  . Total Protein 10/30/2015 7.2  6.5 - 8.1 g/dL Final  . Albumin 10/30/2015 3.1* 3.5 - 5.0 g/dL Final  . AST 10/30/2015 15  15 - 41 U/L Final  . ALT 10/30/2015 8* 14 - 54 U/L Final  . Alkaline Phosphatase 10/30/2015 85  38 - 126 U/L Final  . Total Bilirubin 10/30/2015 0.6  0.3 - 1.2 mg/dL Final  . GFR calc non Af Amer 10/30/2015 >60  >60 mL/min Final  . GFR calc Af Amer 10/30/2015 >60  >60 mL/min Final   Comment: (NOTE) The eGFR has been calculated using the CKD EPI equation. This calculation has not been validated in all clinical situations. eGFR's persistently <60 mL/min signify possible Chronic Kidney Disease.   . Anion gap 10/30/2015 5  5 - 15 Final  . Prothrombin Time 10/30/2015 26.0* 11.4 - 15.0 seconds Final  . INR 10/30/2015 2.41   Final  . Ferritin 10/30/2015 14  11 - 307 ng/mL Final    Assessment:  Yolanda Wells is a 81 y.o. female African-American woman with JAK2+ polycythemia rubra vera (PV) previously on a phlebotomy program and hydroxyurea. She received P32 in an attempt to manage her counts in 03/2015.   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 She was diagnosed with bilateral lower extremity DVTs on 05/18/2015 and bilateral pulmonary emboli on 05/24/2015. She underwent IVC filter placement on 05/25/2015.  She is on a fluctuating dose of Coumadin.  INR was 2.41 today.  She is on a baby aspirin.  She developed progressive erythrocytosis, thrombocytosis, and leukocytosis.  She underwent phlebotomy for a hematocrit of 55.4 on 09/23/2015.  Hematocrit decreased to 50.0, but has again increased after initiation of oral iron.  Platelet  count has increased from 1.1 million to 1.4 million.  White count increased from 23,000 - 28,000 to 31,800.  She has been on hydroxyurea (1000 mg a day) since 10/02/2015.  She continues weekly phlebotomies (goal hematocrit 42).  Platelet count initially decreased (1.4 million to 400,000), but has climbed back up to 960,000 possibly secondary to an acute phase reactant (UTI versus iron deficiency).  MCV is 78.7.  WBC and ANC are adequate.  Symptomatically, she requires assistance with activities of daily living.  She recently fell.  She is having trouble with her colostomy. Exam reveals no appreciable hepatosplenomegaly.   Plan: 1.  Labs today:  CBC with diff, CMP, PT/INR, ferritin. 2.  Continue phlebotomy program.  Hematocrit goal 42.  Phlebotomy 300 cc tomorrow. 3.  RTC weekly x 3 for CBC with diff +/- phlebotomy. 4.  Increase hydroxyurea to 1500 mg Mondays, Wednesdays, and Fridays and 1000 mg Tuesday, Thursdays, Saturdays, and Sundays.  Goal platelet count 400,000. 5.  Continue baby aspirin. 6.  Follow-up with PCP regarding adjustment of Coumadin. 7.  RTC in 1 month for MD assess, labs (CBC with diff, CMP, ferritin, ESR) +/- phlebotomy.   Lequita Asal, MD  10/30/2015, 2:19 PM

## 2015-10-30 NOTE — Progress Notes (Signed)
Patient is here for follow-up of polycythemia vera. Patient states that she has not been feeling to good lately. She was dx'ed with a UTI and was recently started on Keflex for 7 days. Patient denies any pain today. States that appetite has been good.

## 2015-10-31 ENCOUNTER — Inpatient Hospital Stay: Payer: Medicare Other

## 2015-10-31 ENCOUNTER — Encounter: Payer: Self-pay | Admitting: Hematology and Oncology

## 2015-11-01 ENCOUNTER — Inpatient Hospital Stay: Payer: Medicare Other

## 2015-11-01 ENCOUNTER — Other Ambulatory Visit: Payer: Self-pay

## 2015-11-01 DIAGNOSIS — D45 Polycythemia vera: Secondary | ICD-10-CM

## 2015-11-06 ENCOUNTER — Other Ambulatory Visit: Payer: Self-pay | Admitting: Hematology and Oncology

## 2015-11-07 ENCOUNTER — Inpatient Hospital Stay: Payer: Medicare Other

## 2015-11-07 ENCOUNTER — Emergency Department
Admission: EM | Admit: 2015-11-07 | Discharge: 2015-11-07 | Disposition: A | Discharge status: Home / Self Care | Payer: Medicare Other | Attending: Emergency Medicine | Admitting: Emergency Medicine

## 2015-11-07 ENCOUNTER — Emergency Department: Payer: Medicare Other

## 2015-11-07 ENCOUNTER — Other Ambulatory Visit: Payer: Self-pay

## 2015-11-07 DIAGNOSIS — Z7901 Long term (current) use of anticoagulants: Secondary | ICD-10-CM | POA: Insufficient documentation

## 2015-11-07 DIAGNOSIS — R1110 Vomiting, unspecified: Secondary | ICD-10-CM | POA: Diagnosis not present

## 2015-11-07 DIAGNOSIS — D45 Polycythemia vera: Secondary | ICD-10-CM | POA: Diagnosis not present

## 2015-11-07 DIAGNOSIS — Z79899 Other long term (current) drug therapy: Secondary | ICD-10-CM | POA: Diagnosis not present

## 2015-11-07 DIAGNOSIS — Z933 Colostomy status: Secondary | ICD-10-CM | POA: Diagnosis not present

## 2015-11-07 DIAGNOSIS — I10 Essential (primary) hypertension: Secondary | ICD-10-CM | POA: Insufficient documentation

## 2015-11-07 DIAGNOSIS — Z7982 Long term (current) use of aspirin: Secondary | ICD-10-CM | POA: Insufficient documentation

## 2015-11-07 DIAGNOSIS — R1032 Left lower quadrant pain: Secondary | ICD-10-CM | POA: Insufficient documentation

## 2015-11-07 DIAGNOSIS — Z7951 Long term (current) use of inhaled steroids: Secondary | ICD-10-CM | POA: Insufficient documentation

## 2015-11-07 DIAGNOSIS — Z792 Long term (current) use of antibiotics: Secondary | ICD-10-CM | POA: Diagnosis not present

## 2015-11-07 LAB — URINALYSIS COMPLETE WITH MICROSCOPIC (ARMC ONLY)
Bilirubin Urine: NEGATIVE
GLUCOSE, UA: NEGATIVE mg/dL
Hgb urine dipstick: NEGATIVE
Ketones, ur: NEGATIVE mg/dL
Leukocytes, UA: NEGATIVE
Nitrite: NEGATIVE
PROTEIN: NEGATIVE mg/dL
Specific Gravity, Urine: 1.043 — ABNORMAL HIGH (ref 1.005–1.030)
pH: 5 (ref 5.0–8.0)

## 2015-11-07 LAB — CBC WITH DIFFERENTIAL/PLATELET
Basophils Absolute: 0 10*3/uL (ref 0–0.1)
Basophils Relative: 1 %
Eosinophils Absolute: 0.2 10*3/uL (ref 0–0.7)
Eosinophils Relative: 5 %
HCT: 46.1 % (ref 35.0–47.0)
Hemoglobin: 14.9 g/dL (ref 12.0–16.0)
Lymphocytes Relative: 25 %
Lymphs Abs: 1.1 10*3/uL (ref 1.0–3.6)
MCH: 25.1 pg — ABNORMAL LOW (ref 26.0–34.0)
MCHC: 32.3 g/dL (ref 32.0–36.0)
MCV: 77.9 fL — ABNORMAL LOW (ref 80.0–100.0)
Monocytes Absolute: 0.1 10*3/uL — ABNORMAL LOW (ref 0.2–0.9)
Monocytes Relative: 2 %
Neutro Abs: 2.8 10*3/uL (ref 1.4–6.5)
Neutrophils Relative %: 67 %
Platelets: 805 10*3/uL — ABNORMAL HIGH (ref 150–440)
RBC: 5.91 MIL/uL — ABNORMAL HIGH (ref 3.80–5.20)
RDW: 22.2 % — ABNORMAL HIGH (ref 11.5–14.5)
WBC: 4.2 10*3/uL (ref 3.6–11.0)

## 2015-11-07 LAB — PROTIME-INR
INR: 1.78
Prothrombin Time: 20.7 seconds — ABNORMAL HIGH (ref 11.4–15.0)

## 2015-11-07 LAB — COMPREHENSIVE METABOLIC PANEL
ALBUMIN: 3.3 g/dL — AB (ref 3.5–5.0)
ALK PHOS: 83 U/L (ref 38–126)
ALT: 9 U/L — AB (ref 14–54)
ANION GAP: 7 (ref 5–15)
AST: 17 U/L (ref 15–41)
BILIRUBIN TOTAL: 1.1 mg/dL (ref 0.3–1.2)
BUN: 7 mg/dL (ref 6–20)
CALCIUM: 9 mg/dL (ref 8.9–10.3)
CO2: 24 mmol/L (ref 22–32)
Chloride: 108 mmol/L (ref 101–111)
Creatinine, Ser: 0.44 mg/dL (ref 0.44–1.00)
GFR calc Af Amer: >60 mL/min (ref >60)
GFR calc non Af Amer: >60 mL/min (ref >60)
GLUCOSE: 104 mg/dL — AB (ref 65–99)
Potassium: 4.2 mmol/L (ref 3.5–5.1)
SODIUM: 139 mmol/L (ref 135–145)
Total Protein: 7.2 g/dL (ref 6.5–8.1)

## 2015-11-07 LAB — CBC
HCT: 46.8 % (ref 35.0–47.0)
HEMOGLOBIN: 14.7 g/dL (ref 12.0–16.0)
MCH: 24.6 pg — AB (ref 26.0–34.0)
MCHC: 31.4 g/dL — AB (ref 32.0–36.0)
MCV: 78.2 fL — ABNORMAL LOW (ref 80.0–100.0)
Platelets: 840 10*3/uL — ABNORMAL HIGH (ref 150–440)
RBC: 5.99 MIL/uL — ABNORMAL HIGH (ref 3.80–5.20)
RDW: 22.1 % — AB (ref 11.5–14.5)
WBC: 4.9 10*3/uL (ref 3.6–11.0)

## 2015-11-07 LAB — LIPASE, BLOOD: Lipase: 15 U/L (ref 11–51)

## 2015-11-07 MED ORDER — IOHEXOL 240 MG/ML SOLN
25.0000 mL | Freq: Once | INTRAMUSCULAR | Status: AC | PRN
  Administered 2015-11-07: 25 mL via ORAL

## 2015-11-07 MED ORDER — IOHEXOL 300 MG/ML  SOLN
100.0000 mL | Freq: Once | INTRAMUSCULAR | Status: AC | PRN
  Administered 2015-11-07: 100 mL via INTRAVENOUS

## 2015-11-07 MED ORDER — SODIUM CHLORIDE 0.9 % IV BOLUS (SEPSIS)
500.0000 mL | Freq: Once | INTRAVENOUS | Status: AC
  Administered 2015-11-07: 500 mL via INTRAVENOUS

## 2015-11-07 MED ORDER — FENTANYL CITRATE (PF) 100 MCG/2ML IJ SOLN
50.0000 ug | Freq: Once | INTRAMUSCULAR | Status: AC
  Administered 2015-11-07: 50 ug via INTRAVENOUS
  Filled 2015-11-07: qty 2

## 2015-11-07 MED ORDER — ONDANSETRON HCL 4 MG/2ML IJ SOLN
4.0000 mg | Freq: Once | INTRAMUSCULAR | Status: AC
  Administered 2015-11-07: 4 mg via INTRAVENOUS
  Filled 2015-11-07: qty 2

## 2015-11-07 NOTE — ED Notes (Signed)
Pt states that she has been experiencing LLQ abdominal pain for last 3 hours at site of colostomy bag. Pt also c/o nausea. Pt alert and oriented X4, active, cooperative, pt in NAD. RR even and unlabored, color WNL.

## 2015-11-07 NOTE — ED Provider Notes (Addendum)
[facility] Emergency Department Provider Note  ____________________________________________   I have reviewed the triage vital signs and the nursing notes.   HISTORY  Chief Complaint Emesis and Abdominal Pain    HPI Yolanda Wells is a 81 y.o. female with an extensive complicated recent medical history including a Hartmann procedure for bleeding diverticulitis with a colostomy, hypertension DVT arthritis CVA PE polycythemia vera [place] filterrecurrent abdominal pain, who presents today with left lower quadrant abdominal pain similar to her diverticulitis with vomiting today. She denies any fever. He is below her colostomy. Apparently there was also a trace of blood in her ostomy bag. This began gradually today.  Past Medical History  Diagnosis Date  . Hypertension   . Deep venous thrombosis (HCC)     right lower extremity  . History of hysterectomy   . History of bilateral hip replacements   . Polycythemia vera ([place])   . Dependent edema   . Arthritis   . Collagen vascular disease ([place])   . Stroke [facility])     Pt reports she was told she had a stroke in may    Patient Active Problem List   Diagnosis Date Noted  . Leukocytosis 09/25/2015  . Chest pain 09/25/2015  . COPD exacerbation ([place]) 09/24/2015  . Right lower lobe pneumonia 09/24/2015  . Dyspnea 09/22/2015  . Elevated troponin 09/15/2015  . UTI (lower urinary tract infection) 07/16/2015  . Abdominal wall cellulitis 07/15/2015  . DVT (deep venous thrombosis) ([place]) 07/15/2015  . HTN (hypertension) 07/15/2015  . Polycythemia vera ([place]) 07/15/2015  . Dependent edema 07/15/2015  . Arthritis 07/15/2015  . Pulmonary emboli ([place]) 05/25/2015  . Diverticulosis of colon with hemorrhage 04/02/2015    Past Surgical History  Procedure Laterality Date  . Toe amputation      right  . Joint replacement      Left and Right Hip  . Abdominal hysterectomy    . Peripheral vascular catheterization N/A  04/02/2015    Procedure: Visceral Angiography;  Surgeon: Algernon Huxley, MD;  Location: [place] [facility];  Service: Cardiovascular;  Laterality: N/A;  . Peripheral vascular catheterization N/A 04/02/2015    Procedure: Visceral Artery Intervention;  Surgeon: Algernon Huxley, MD;  Location: [place] [facility];  Service: Cardiovascular;  Laterality: N/A;  . Colectomy with colostomy creation/hartmann procedure N/A 04/03/2015    Procedure: COLECTOMY WITH COLOSTOMY CREATION/HARTMANN PROCEDURE;  Surgeon: Sherri Rad, MD;  Location: [facility] ORS;  Service: General;  Laterality: N/A;  . Cardiac catheterization Right 04/03/2015    Procedure: CENTRAL LINE INSERTION;  Surgeon: Sherri Rad, MD;  Location: [facility] ORS;  Service: General;  Laterality: Right;  . Peripheral vascular catheterization N/A 05/25/2015    Procedure: IVC Filter Insertion;  Surgeon: Katha Cabal, MD;  Location: [place] [facility];  Service: Cardiovascular;  Laterality: N/A;    Current Outpatient Rx  Name  Route  Sig  Dispense  Refill  . aspirin EC 81 MG tablet   Oral   Take 1 tablet (81 mg total) by mouth daily.   30 tablet   2   . budesonide-formoterol (SYMBICORT) 160-4.5 MCG/ACT inhaler   Inhalation   Inhale 2 puffs into the lungs 2 (two) times daily.   1 Inhaler   12   . cephALEXin (KEFLEX) 250 MG capsule   Oral   Take 250 mg by mouth 4 (four) times daily.         . chlorpheniramine-HYDROcodone (TUSSIONEX) 10-8 MG/5ML SUER   Oral  Take 5 mLs by mouth every 12 (twelve) hours.   140 mL   0   . docusate sodium (COLACE) 100 MG capsule   Oral   Take 100 mg by mouth 2 (two) times daily.         . ferrous sulfate 325 (65 FE) MG tablet   Oral   Take 325 mg by mouth 2 (two) times daily with a meal.         . folic acid (FOLVITE) A999333 MCG tablet   Oral   Take 400 mcg by mouth daily.         . furosemide (LASIX) 20 MG tablet   Oral   Take 20 mg by mouth daily.         [name] guaiFENesin (MUCINEX) 600 MG 12 hr tablet    Oral   Take 1 tablet (600 mg total) by mouth 2 (two) times daily.   20 tablet   0   . hydroxyurea (HYDREA) 500 MG capsule   Oral   Take 2 capsules by mouth every Tuesday, Thursday, Saturday, and Sunday.          . hydroxyurea (HYDREA) 500 MG capsule   Oral   Take 1,500 mg by mouth every Monday, Wednesday, and Friday. May take with food to minimize GI side effects.         [name] ipratropium-albuterol (DUONEB) 0.5-2.5 (3) MG/3ML SOLN   Nebulization   Take 3 mLs by nebulization every 4 (four) hours.   360 mL   6   . menthol-cetylpyridinium (CEPACOL) 3 MG lozenge   Oral   Take 1 lozenge (3 mg total) by mouth as needed for sore throat.   100 tablet   12   . metoprolol tartrate (LOPRESSOR) 25 MG tablet   Oral   Take 1 tablet (25 mg total) by mouth 2 (two) times daily.   60 tablet   6   . mupirocin ointment (BACTROBAN) 2 %   Nasal   Place 1 application into the nose 2 (two) times daily.   22 g   0   . ondansetron (ZOFRAN) 4 MG tablet   Oral   Take 4 mg by mouth every 6 (six) hours as needed for nausea or vomiting.         [name] oxyCODONE-acetaminophen (PERCOCET/ROXICET) 5-325 MG tablet   Oral   Take 1 tablet by mouth every 6 (six) hours as needed for severe pain.   20 tablet   0   . potassium chloride SA (K-DUR,KLOR-CON) 20 MEQ tablet   Oral   Take 1 tablet (20 mEq total) by mouth 2 (two) times daily.   30 tablet   0   . simethicone (MYLICON) 80 MG chewable tablet   Oral   Wells 30 mg by mouth 4 (four) times daily -  before meals and at bedtime.         [name] warfarin (COUMADIN) 4 MG tablet   Oral   Take 1 tablet (4 mg total) by mouth daily at 6 PM.   15 tablet   0     Allergies Review of patient's allergies indicates no known allergies.  Family History  Problem Relation Age of Onset  . Heart attack Father   . Hypertension    . Arthritis-Osteo    . COPD Father   . Diabetes Brother   . Osteosarcoma Son     Social History Social History  Substance Use  Topics  . Smoking status: Never Smoker   .  Smokeless tobacco: Never Used  . Alcohol Use: No    Review of Systems Constitutional: No fever/chills Eyes: No visual changes. ENT: No sore throat. No stiff neck no neck pain Cardiovascular: Denies chest pain. Respiratory: Denies shortness of breath. Gastrointestinal:   Positive for nonbloody nonbilious vomiting.  No diarrhea.  No constipation. Genitourinary: Negative for dysuria. Musculoskeletal: Negative lower extremity swelling Skin: Negative for rash. Neurological: Negative for headaches, focal weakness or numbness. 10-point ROS otherwise negative.  ____________________________________________   PHYSICAL EXAM:  VITAL SIGNS: ED Triage Vitals  Enc Vitals Group     BP 11/07/15 1321 142/82 mmHg     Pulse Rate 11/07/15 1321 84     Resp 11/07/15 1321 18     Temp 11/07/15 1321 98.2 F (36.8 C)     Temp Source 11/07/15 1321 Oral     SpO2 11/07/15 1321 100 %     Weight 11/07/15 1321 209 lb (94.802 kg)     Height 11/07/15 1321 5\' 7"  (1.702 m)     Head Cir --      Peak Flow --      Pain Score 11/07/15 1334 8     Pain Loc --      Pain Edu? --      Excl. in [place]? --     Constitutional: Alert and oriented. Well appearing and in no acute distress. Eyes: Conjunctivae are normal. PERRL. EOMI. Head: Atraumatic. Nose: No congestion/rhinnorhea. Mouth/Throat: Mucous membranes are moist.  Oropharynx non-erythematous. Neck: No stridor.   Nontender with no meningismus Cardiovascular: Normal rate, regular rhythm. Grossly normal heart sounds.  Good peripheral circulation. Respiratory: Normal respiratory effort.  No retractions. Lungs CTAB. Abdominal: Is minimal tenderness to palpation left lower quadrant, ostomy bag is draining nonbloody stool. [place]. Back:  There is no focal tenderness or step off there is no midline tenderness there are no lesions noted. there is no CVA tenderness Musculoskeletal: No lower extremity tenderness. No joint  effusions, Neurologic:  Normal speech and language. No gross focal neurologic deficits are appreciated.  Skin:  Skin is warm, dry and intact. No rash noted. Psychiatric: Mood and affect are normal. Speech and behavior are normal.  ____________________________________________   LABS (all labs ordered are listed, but only abnormal results are displayed)  Labs Reviewed  COMPREHENSIVE METABOLIC PANEL - Abnormal; Notable for the following:    Glucose, Bld 104 (*)    Albumin 3.3 (*)    ALT 9 (*)    All other components within normal limits  CBC - Abnormal; Notable for the following:    RBC 5.99 (*)    MCV 78.2 (*)    MCH 24.6 (*)    MCHC 31.4 (*)    RDW 22.1 (*)    Platelets 840 (*)    All other components within normal limits  LIPASE, BLOOD  URINALYSIS COMPLETEWITH MICROSCOPIC ([facility] ONLY)  PROTIME-INR   ____________________________________________  EKG  I personally interpreted any EKGs ordered by me or triage  ____________________________________________  RADIOLOGY  I reviewed any imaging ordered by me or triage that were performed during my shift ____________________________________________   PROCEDURES  Procedure(s) performed: None  Critical Care performed: None  ____________________________________________   INITIAL IMPRESSION / ASSESSMENT AND PLAN / ED COURSE  Pertinent labs & imaging results that were available during my care of the patient were reviewed by me and considered in my medical decision making (see chart for details).  Patient with abdominal pain with a history of diverticulitis and multiple other medical  problems. Blood work is reassuring. She does not have a fever or white count. We will put the patient in for pain medication nausea medication and we will obtain a CT scan to rule out recurrent diverticular disease.  ----------------------------------------- 10:17 PM on 11/07/2015 -----------------------------------------  Serial abdominal  exams are quite benign there is no evidence of tenderness at this time. Patient is eating a sandwich and demanding that we pull out her IV. Workup is normal here. Family will watch her closely at home. Patient has normal sensation saturation no cough no chest pain or shortness of breath and I do not believe she has a pneumonia despite the atelectasis noted on CT scan. Family will keep a close watch on her. All of her findings are quite reassuring and we will send her home with the care of her family    ____________________________________________   FINAL CLINICAL IMPRESSION(S) / ED DIAGNOSES  Final diagnoses:  None     Schuyler Amor, MD 11/07/15 [place], MD 11/07/15 2218

## 2015-11-07 NOTE — ED Notes (Signed)
Pt to ED via home with c/o abdominal pain to "left side" and vomiting. Pt reports that pain is 9/10. Presents with colostomy to LLQ. Multiple healed scars noted to abdomen. Pt reports vomiting x 3 earlier today. Reports continued pain with nausea at this time. Pt is awake and alert, speaking in complete, coherent sentences without difficulty during assessment. Will continue to monitor.

## 2015-11-07 NOTE — ED Notes (Signed)
Report given to Christine, RN

## 2015-11-07 NOTE — ED Notes (Signed)
Patient transported to CT 

## 2015-11-07 NOTE — Discharge Instructions (Signed)

## 2015-11-08 ENCOUNTER — Other Ambulatory Visit: Payer: Self-pay

## 2015-11-08 ENCOUNTER — Telehealth: Payer: Self-pay | Admitting: *Deleted

## 2015-11-08 DIAGNOSIS — D45 Polycythemia vera: Secondary | ICD-10-CM

## 2015-11-08 NOTE — Telephone Encounter (Addendum)
  OK to take over Coumadin.  We will need to set up weekly INRs and visits evry 2 weeks until she is regulated.   M

## 2015-11-08 NOTE — Telephone Encounter (Signed)
Called Yolanda Wells back and informed that Yolanda Wells will take over at her next appt. Msg sent to scheduling

## 2015-11-08 NOTE — Telephone Encounter (Signed)
PCP has been adjusting coumadin with INR drawn by home health nurse, Un happy with hame health agency who has a high turnover rate and has missed 2 INR draws. Since she is here weekly any way adn she has difficulty with transportation she is asking if Dr Mike Gip will take this over to keep her form having to go to 3 different doctors every week

## 2015-11-09 ENCOUNTER — Emergency Department (HOSPITAL_COMMUNITY): Payer: Medicare Other

## 2015-11-09 ENCOUNTER — Encounter (HOSPITAL_COMMUNITY): Payer: Self-pay | Admitting: *Deleted

## 2015-11-09 ENCOUNTER — Emergency Department (HOSPITAL_COMMUNITY)
Admission: EM | Admit: 2015-11-09 | Discharge: 2015-11-09 | Disposition: A | Discharge status: Home / Self Care | Payer: Medicare Other | Attending: Emergency Medicine | Admitting: Emergency Medicine

## 2015-11-09 DIAGNOSIS — Z8673 Personal history of transient ischemic attack (TIA), and cerebral infarction without residual deficits: Secondary | ICD-10-CM | POA: Diagnosis not present

## 2015-11-09 DIAGNOSIS — Z90710 Acquired absence of both cervix and uterus: Secondary | ICD-10-CM | POA: Insufficient documentation

## 2015-11-09 DIAGNOSIS — M1990 Unspecified osteoarthritis, unspecified site: Secondary | ICD-10-CM | POA: Diagnosis not present

## 2015-11-09 DIAGNOSIS — I10 Essential (primary) hypertension: Secondary | ICD-10-CM | POA: Insufficient documentation

## 2015-11-09 DIAGNOSIS — Z79899 Other long term (current) drug therapy: Secondary | ICD-10-CM | POA: Diagnosis not present

## 2015-11-09 DIAGNOSIS — R109 Unspecified abdominal pain: Secondary | ICD-10-CM | POA: Insufficient documentation

## 2015-11-09 DIAGNOSIS — Z7982 Long term (current) use of aspirin: Secondary | ICD-10-CM | POA: Diagnosis not present

## 2015-11-09 DIAGNOSIS — Z86718 Personal history of other venous thrombosis and embolism: Secondary | ICD-10-CM | POA: Insufficient documentation

## 2015-11-09 LAB — COMPREHENSIVE METABOLIC PANEL
ALT: 6 U/L — ABNORMAL LOW (ref 14–54)
AST: 14 U/L — ABNORMAL LOW (ref 15–41)
Albumin: 2.6 g/dL — ABNORMAL LOW (ref 3.5–5.0)
Alkaline Phosphatase: 68 U/L (ref 38–126)
Anion gap: 7 (ref 5–15)
BUN: 5 mg/dL — ABNORMAL LOW (ref 6–20)
CO2: 23 mmol/L (ref 22–32)
Calcium: 8.5 mg/dL — ABNORMAL LOW (ref 8.9–10.3)
Chloride: 108 mmol/L (ref 101–111)
Creatinine, Ser: 0.4 mg/dL — ABNORMAL LOW (ref 0.44–1.00)
GFR calc Af Amer: >60 mL/min (ref >60)
GFR calc non Af Amer: >60 mL/min (ref >60)
Glucose, Bld: 89 mg/dL (ref 65–99)
Potassium: 4.3 mmol/L (ref 3.5–5.1)
Sodium: 138 mmol/L (ref 135–145)
Total Bilirubin: 0.8 mg/dL (ref 0.3–1.2)
Total Protein: 5.8 g/dL — ABNORMAL LOW (ref 6.5–8.1)

## 2015-11-09 LAB — CBC WITH DIFFERENTIAL/PLATELET
Basophils Absolute: 0 10*3/uL (ref 0.0–0.1)
Basophils Relative: 1 %
Eosinophils Absolute: 0.2 10*3/uL (ref 0.0–0.7)
Eosinophils Relative: 6 %
HCT: 42.9 % (ref 36.0–46.0)
Hemoglobin: 13 g/dL (ref 12.0–15.0)
Lymphocytes Relative: 35 %
Lymphs Abs: 1.3 10*3/uL (ref 0.7–4.0)
MCH: 24.6 pg — ABNORMAL LOW (ref 26.0–34.0)
MCHC: 30.3 g/dL (ref 30.0–36.0)
MCV: 81.3 fL (ref 78.0–100.0)
Monocytes Absolute: 0.1 10*3/uL (ref 0.1–1.0)
Monocytes Relative: 3 %
Neutro Abs: 2 10*3/uL (ref 1.7–7.7)
Neutrophils Relative %: 55 %
Platelets: 554 10*3/uL — ABNORMAL HIGH (ref 150–400)
RBC: 5.28 MIL/uL — ABNORMAL HIGH (ref 3.87–5.11)
RDW: 23.2 % — ABNORMAL HIGH (ref 11.5–15.5)
Smear Review: INCREASED
WBC: 3.6 10*3/uL — ABNORMAL LOW (ref 4.0–10.5)

## 2015-11-09 LAB — LIPASE, BLOOD: Lipase: 15 U/L (ref 11–51)

## 2015-11-09 NOTE — ED Notes (Addendum)
Pt in from [facility], independent living, per EMS pt was seen at [facility] x 2 days ago for pain around colostomy area on LLQ, pt presents now with unrelieved pain at colostomy site onset x 5 mths, pain worsening x 4 days, pt A&O x4

## 2015-11-09 NOTE — Discharge Instructions (Signed)
Abdominal Pain, Adult Many things can cause abdominal pain. Usually, abdominal pain is not caused by a disease and will improve without treatment. It can often be observed and treated at home. Your health care provider will do a physical exam and possibly order blood tests and X-rays to help determine the seriousness of your pain. However, in many cases, more time must pass before a clear cause of the pain can be found. Before that point, your health care provider may not know if you need more testing or further treatment. HOME CARE INSTRUCTIONS Monitor your abdominal pain for any changes. The following actions may help to alleviate any discomfort you are experiencing:  Only take over-the-counter or prescription medicines as directed by your health care provider.  Do not take laxatives unless directed to do so by your health care provider.  Try a clear liquid diet (broth, tea, or water) as directed by your health care provider. Slowly move to a bland diet as tolerated. SEEK MEDICAL CARE IF:  You have unexplained abdominal pain.  You have abdominal pain associated with nausea or diarrhea.  You have pain when you urinate or have a bowel movement.  You experience abdominal pain that wakes you in the night.  You have abdominal pain that is worsened or improved by eating food.  You have abdominal pain that is worsened with eating fatty foods.  You have a fever. SEEK IMMEDIATE MEDICAL CARE IF:  Your pain does not go away within 2 hours.  You keep throwing up (vomiting).  Your pain is felt only in portions of the abdomen, such as the right side or the left lower portion of the abdomen.  You pass bloody or black tarry stools. MAKE SURE YOU:  Understand these instructions.  Will watch your condition.  Will get help right away if you are not doing well or get worse.   This information is not intended to replace advice given to you by your health care provider. Make sure you discuss  any questions you have with your health care provider.   Document Released: 08/06/2005 Document Revised: 07/18/2015 Document Reviewed: 07/06/2013 Elsevier Interactive Patient Education 2016 [employer].   Please read attached information. If you experience any new or worsening signs or symptoms please return to the emergency room for evaluation. Please follow-up with your primary care provider or specialist as discussed. Please use medication prescribed only as directed and discontinue taking if you have any concerning signs or symptoms.

## 2015-11-09 NOTE — ED Provider Notes (Signed)
CSN: [id number]     Arrival date & time 11/09/15  1146 History   First MD Initiated Contact with Patient 11/09/15 1157     Chief Complaint  Patient presents with  . Abdominal Pain   HPI    81 year old female presents today with abdominal pain. Patient reports that in May 2016 she had diverticulitis requiring surgery with colostomy placement. She reports since that time she's had intermittent abdominal pain that has worsened over the last 2 weeks. She reports that she was seen at [facility] 2 days ago with a CT scan that showed no significant findings. Chance granddaughters present at the time of evaluation reports that she went home and checked the records online and saw that they did not follow-up with an ultrasound as recommended by the CT scan for potential gallstones. She called her gastroenterologist today and spoke nursing staff told them about patient's pain and they recommended that she follow up in the [facility] for an ultrasound as gallstones cause referred pain. She reports that yesterday the pain went away and she was asymptomatic, he returned again today. She describes it as a sharp sensation in the area surrounding the colostomy bag. She denies any fever, chills, nausea, vomiting, abdominal pain, changes in the color clarity or characteristics of her urine. She does report that she has had minimal output out of the colostomy over the last couple of days, but admits she has not any large amount of by mouth intake. Patient's granddaughter requesting ultrasound for gallbladder pathology.  Past Medical History  Diagnosis Date  . Hypertension   . Deep venous thrombosis (HCC)     right lower extremity  . History of hysterectomy   . History of bilateral hip replacements   . Polycythemia vera ([place])   . Dependent edema   . Arthritis   . Collagen vascular disease ([place])   . Stroke [facility])     Pt reports she was told she had a stroke in may   Past Surgical History  Procedure  Laterality Date  . Toe amputation      right  . Joint replacement      Left and Right Hip  . Abdominal hysterectomy    . Peripheral vascular catheterization N/A 04/02/2015    Procedure: Visceral Angiography;  Surgeon: Algernon Huxley, MD;  Location: [facility];  Service: Cardiovascular;  Laterality: N/A;  . Peripheral vascular catheterization N/A 04/02/2015    Procedure: Visceral Artery Intervention;  Surgeon: Algernon Huxley, MD;  Location: [facility];  Service: Cardiovascular;  Laterality: N/A;  . Colectomy with colostomy creation/hartmann procedure N/A 04/03/2015    Procedure: COLECTOMY WITH COLOSTOMY CREATION/HARTMANN PROCEDURE;  Surgeon: Sherri Rad, MD;  Location: [facility];  Service: General;  Laterality: N/A;  . Cardiac catheterization Right 04/03/2015    Procedure: CENTRAL LINE INSERTION;  Surgeon: Sherri Rad, MD;  Location: [facility];  Service: General;  Laterality: Right;  . Peripheral vascular catheterization N/A 05/25/2015    Procedure: IVC Filter Insertion;  Surgeon: Katha Cabal, MD;  Location: [place] [facility];  Service: Cardiovascular;  Laterality: N/A;   Family History  Problem Relation Age of Onset  . Heart attack Father   . Hypertension    . Arthritis-Osteo    . COPD Father   . Diabetes Brother   . Osteosarcoma Son    Social History  Substance Use Topics  . Smoking status: Never Smoker   . Smokeless tobacco: Never Used  . Alcohol  Use: No   OB History    Gravida Para Term Preterm AB TAB SAB Ectopic Multiple Living   5 5 5             Review of Systems  All other systems reviewed and are negative.   Allergies  Review of patient's allergies indicates no known allergies.  Home Medications   Prior to Admission medications   Medication Sig Start Date End Date Taking? Authorizing Provider  aspirin EC 81 MG tablet Take 1 tablet (81 mg total) by mouth daily. 09/16/15   [name], MD  budesonide-formoterol (SYMBICORT) 160-4.5 MCG/ACT inhaler  Inhale 2 puffs into the lungs 2 (two) times daily. 09/24/15   Theodoro Grist, MD  chlorpheniramine-HYDROcodone (TUSSIONEX) 10-8 MG/5ML SUER Take 5 mLs by mouth every 12 (twelve) hours. Patient not taking: Reported on 11/07/2015 09/25/15   Theodoro Grist, MD  docusate sodium (COLACE) 100 MG capsule Take 100 mg by mouth 2 (two) times daily as needed for mild constipation.     Historical Provider, MD  folic acid (FOLVITE) A999333 MCG tablet Take 400 mcg by mouth daily.    Historical Provider, MD  furosemide (LASIX) 20 MG tablet Take 20 mg by mouth daily as needed for edema.     Historical Provider, MD  guaiFENesin (MUCINEX) 600 MG 12 hr tablet Take 1 tablet (600 mg total) by mouth 2 (two) times daily. Patient not taking: Reported on 11/07/2015 09/25/15   Theodoro Grist, MD  hydroxyurea (HYDREA) 500 MG capsule Take 1,000-1,500 mg by mouth daily. Pt takes two capsules on Tuesday, Thursday, Saturday, and Sunday.   Pt takes three capsules on Monday, Wednesday, and Friday.    Historical Provider, MD  ipratropium-albuterol (DUONEB) 0.5-2.5 (3) MG/3ML SOLN Take 3 mLs by nebulization every 4 (four) hours. Patient taking differently: Take 3 mLs by nebulization every 4 (four) hours as needed (for wheezing/shortness of breath).  09/24/15   Theodoro Grist, MD  menthol-cetylpyridinium (CEPACOL) 3 MG lozenge Take 1 lozenge (3 mg total) by mouth as needed for sore throat. Patient not taking: Reported on 11/07/2015 09/25/15   Theodoro Grist, MD  metoprolol tartrate (LOPRESSOR) 25 MG tablet Take 1 tablet (25 mg total) by mouth 2 (two) times daily. 09/25/15   Theodoro Grist, MD  mupirocin ointment (BACTROBAN) 2 % Place 1 application into the nose 2 (two) times daily. 09/16/15   [name], MD  ondansetron (ZOFRAN) 4 MG tablet Take 4 mg by mouth every 6 (six) hours as needed for nausea or vomiting.    Historical Provider, MD  oxyCODONE-acetaminophen (PERCOCET/ROXICET) 5-325 MG tablet Take 1 tablet by mouth every 6 (six)  hours as needed for severe pain. 09/16/15   [name], MD  potassium chloride SA (K-DUR,KLOR-CON) 20 MEQ tablet Take 1 tablet (20 mEq total) by mouth 2 (two) times daily. 07/20/15   Vaughan Basta, MD  warfarin (COUMADIN) 3 MG tablet Take 3 mg by mouth every evening.    Historical Provider, MD  warfarin (COUMADIN) 4 MG tablet Take 1 tablet (4 mg total) by mouth daily at 6 PM. Patient not taking: Reported on 11/07/2015 09/24/15   Theodoro Grist, MD   BP 150/86 mmHg  Pulse 77  Temp(Src) 98.2 F (36.8 C) (Oral)  Resp 20  SpO2 98%   Physical Exam  Constitutional: She is oriented to person, place, and time. She appears well-developed and well-nourished.  HENT:  Head: Normocephalic and atraumatic.  Eyes: Conjunctivae are normal. Pupils are equal, round, and reactive to light. Right eye  exhibits no discharge. Left eye exhibits no discharge. No scleral icterus.  Neck: Normal range of motion. No JVD present. No tracheal deviation present.  Pulmonary/Chest: Effort normal. No stridor.  Abdominal:  Colostomy patent with stool in colostomy bag. No signs of surrounding infection, minimally tender to outpatient of the surrounding abdomen.  Neurological: She is alert and oriented to person, place, and time. Coordination normal.  Skin: Skin is warm and dry. No rash noted. No erythema. No pallor.  Psychiatric: She has a normal mood and affect. Her behavior is normal. Judgment and thought content normal.  Nursing note and vitals reviewed.     ED Course  Procedures (including critical care time) Labs Review Labs Reviewed  CBC WITH DIFFERENTIAL/PLATELET - Abnormal; Notable for the following:    WBC 3.6 (*)    RBC 5.28 (*)    MCH 24.6 (*)    RDW 23.2 (*)    Platelets 554 (*)    All other components within normal limits  COMPREHENSIVE METABOLIC PANEL - Abnormal; Notable for the following:    BUN 5 (*)    Creatinine, Ser 0.40 (*)    Calcium 8.5 (*)    Total Protein 5.8 (*)    Albumin  2.6 (*)    AST 14 (*)    ALT 6 (*)    All other components within normal limits  LIPASE, BLOOD    Imaging Review Ct Abdomen Pelvis W Contrast  11/07/2015  CLINICAL DATA:  81 year old female with abdominal pain. Medical history of diverticulitis and status post prior heartburn for seizure and colostomy. EXAM: CT ABDOMEN AND PELVIS WITH CONTRAST TECHNIQUE: Multidetector CT imaging of the abdomen and pelvis was performed using the standard protocol following bolus administration of intravenous contrast. CONTRAST:  137mL OMNIPAQUE IOHEXOL 300 MG/ML  SOLN COMPARISON:  CT dated 07/15/2015 FINDINGS: Partially visualized area of consolidation at the right lung base may represent atelectasis versus pneumonia. Underlying mass is not excluded. Clinical correlation and follow-up recommended. Calcified mitral annulus. No intra-abdominal free air or free fluid. There multiple stones within the gallbladder. There is minimal haziness of the gallbladder wall. Ultrasound is recommended for further evaluation of the gallbladder. The liver, pancreas, spleen, and the adrenal glands appear unremarkable. There is mild bilateral renal atrophy. Stable appearing renal hypodense lesions measuring up to 7.5 cm in the interpolar aspect of the right kidney most compatible with cyst. There is no hydronephrosis on either side. There is a right renal cortical irregularity likely related to chronic infection/scarring. The visualized ureters appear unremarkable. Evaluation of the pelvis is limited due to streak artifact caused by metallic hip arthroplasties. The urinary bladder is collapsed. Hysterectomy. There is postsurgical changes of partial colon resection with a left lower quadrant colostomy. There scattered colonic diverticula without active inflammation. Oral contrast noted within the stomach and multiple loops of small bowel without evidence of obstruction. The appendix is normal. There is aortoiliac atherosclerotic disease. The  abdominal aorta is tortuous. The origins of the celiac axis, SMA appear patent. An infrarenal IVC filter noted. The portal vein is patent. No portal venous gas identified. There is no adenopathy. Midline vertical anterior abdominal wall incisional scar. No fluid collection identified along the surgical scar. There is osteopenia with extensive degenerative changes of the spine. Bilateral hip arthroplasties noted. No acute fracture or dislocation. IMPRESSION: Focal right lung base consolidation compatible with atelectasis/ pneumonia. Clinical correlation and follow-up recommended. Cholelithiasis. Ultrasound is recommended for better evaluation of the gallbladder. Stable appearing right renal cysts. Postsurgical  changes of partial colon resection with a left lower quadrant colostomy. No evidence of bowel obstruction or inflammation. Colonic diverticulosis. Electronically Signed   By: Anner Crete M.D.   On: 11/07/2015 19:13   US Abdomen Limited  11/09/2015  CLINICAL DATA:  81 year old female with right-sided abdominal pain. EXAM: US ABDOMEN LIMITED - RIGHT UPPER QUADRANT COMPARISON:  11/07/2015 CT.  02/19/2012 ultrasound. FINDINGS: Gallbladder: Multiple gallstones are identified the largest measuring 1.4 cm in diameter. There is no evidence of gallbladder wall thickening, pericholecystic fluid or sonographic Murphy sign. Common bile duct: Diameter: 5.4 mm. There is no evidence of intrahepatic or extrahepatic biliary dilatation. Liver: No focal lesion identified. Within normal limits in parenchymal echogenicity. IMPRESSION: Cholelithiasis without evidence of acute cholecystitis. No evidence of biliary dilatation. Electronically Signed   By: Margarette Canada M.D.   On: 11/09/2015 13:06   I have personally reviewed and evaluated these images and lab results as part of my medical decision-making.   EKG Interpretation None      MDM   Final diagnoses:  Abdominal pain, unspecified abdominal location    Labs:  CBC, CMP and a lipase- no significant findings  Imaging: Ultrasound abdomen limited cholelithiasis without evidence of acute cholecystitis no evidence of biliary dilatation. CT scan from 2 days ago shows focal right lung base consolidation compatible with atelectasis/pneumonia, cholelithiasis, colonic diverticulosis  Consults:  Therapeutics: Chin offered pain occasion refused  Discharge Meds:   Assessment/Plan: 81 year old female presents today with abdominal pain. This is been present since colostomy placement in May, worsening over the last several weeks. She had a CT scan 2 days ago that showed no acute findings, ultrasound here showed cholelithiasis without cholecystitis, reassuring vital signs and laboratory evaluation. I see no acute process that would require further evaluation or management here in the ED setting. Patient will be instructed to follow-up with her gastroenterologist for further evaluation of chronic abdominal pain. Patient has no cough, shortness of breath, fever that would indicate findings on the CT scan were pneumonia, likely atelectasis. She was informed of all of these results and encouraged follow-up with primary care for reevaluation this week. Patient is given strict return precautions, verbalized understanding and agreement for today's plan and had no further questions or concerns at time of discharge.         Okey Regal, PA-C 11/09/15 1644  Leonard Schwartz, MD 11/17/15 [phone]

## 2015-11-14 ENCOUNTER — Inpatient Hospital Stay: Payer: Medicare Other

## 2015-11-14 ENCOUNTER — Other Ambulatory Visit: Payer: Self-pay

## 2015-11-14 ENCOUNTER — Other Ambulatory Visit: Payer: Medicare Other

## 2015-11-14 ENCOUNTER — Ambulatory Visit: Payer: Medicare Other

## 2015-11-14 ENCOUNTER — Inpatient Hospital Stay: Payer: Medicare Other | Attending: Hematology and Oncology

## 2015-11-14 ENCOUNTER — Other Ambulatory Visit: Payer: Self-pay | Admitting: Hematology and Oncology

## 2015-11-14 DIAGNOSIS — D45 Polycythemia vera: Secondary | ICD-10-CM | POA: Diagnosis not present

## 2015-11-14 DIAGNOSIS — R262 Difficulty in walking, not elsewhere classified: Secondary | ICD-10-CM | POA: Insufficient documentation

## 2015-11-14 DIAGNOSIS — Z7901 Long term (current) use of anticoagulants: Secondary | ICD-10-CM | POA: Insufficient documentation

## 2015-11-14 DIAGNOSIS — Z8673 Personal history of transient ischemic attack (TIA), and cerebral infarction without residual deficits: Secondary | ICD-10-CM | POA: Diagnosis not present

## 2015-11-14 DIAGNOSIS — Z86718 Personal history of other venous thrombosis and embolism: Secondary | ICD-10-CM | POA: Insufficient documentation

## 2015-11-14 DIAGNOSIS — I10 Essential (primary) hypertension: Secondary | ICD-10-CM | POA: Insufficient documentation

## 2015-11-14 DIAGNOSIS — Z87440 Personal history of urinary (tract) infections: Secondary | ICD-10-CM | POA: Insufficient documentation

## 2015-11-14 DIAGNOSIS — Z90710 Acquired absence of both cervix and uterus: Secondary | ICD-10-CM | POA: Diagnosis not present

## 2015-11-14 DIAGNOSIS — M1990 Unspecified osteoarthritis, unspecified site: Secondary | ICD-10-CM | POA: Insufficient documentation

## 2015-11-14 DIAGNOSIS — Z79899 Other long term (current) drug therapy: Secondary | ICD-10-CM | POA: Insufficient documentation

## 2015-11-14 DIAGNOSIS — Z7982 Long term (current) use of aspirin: Secondary | ICD-10-CM | POA: Diagnosis not present

## 2015-11-14 DIAGNOSIS — Z8719 Personal history of other diseases of the digestive system: Secondary | ICD-10-CM | POA: Diagnosis not present

## 2015-11-14 DIAGNOSIS — Z933 Colostomy status: Secondary | ICD-10-CM | POA: Insufficient documentation

## 2015-11-14 DIAGNOSIS — Z9181 History of falling: Secondary | ICD-10-CM | POA: Insufficient documentation

## 2015-11-14 DIAGNOSIS — R5382 Chronic fatigue, unspecified: Secondary | ICD-10-CM | POA: Insufficient documentation

## 2015-11-14 LAB — PROTIME-INR
INR: 1.49
Prothrombin Time: 18.1 seconds — ABNORMAL HIGH (ref 11.4–15.0)

## 2015-11-14 LAB — CBC WITH DIFFERENTIAL/PLATELET
Basophils Absolute: 0 10*3/uL (ref 0–0.1)
Basophils Relative: 1 %
Eosinophils Absolute: 0.1 10*3/uL (ref 0–0.7)
Eosinophils Relative: 4 %
HCT: 44 % (ref 35.0–47.0)
Hemoglobin: 13.9 g/dL (ref 12.0–16.0)
Lymphocytes Relative: 37 %
Lymphs Abs: 0.9 10*3/uL — ABNORMAL LOW (ref 1.0–3.6)
MCH: 24.8 pg — ABNORMAL LOW (ref 26.0–34.0)
MCHC: 31.5 g/dL — ABNORMAL LOW (ref 32.0–36.0)
MCV: 78.5 fL — ABNORMAL LOW (ref 80.0–100.0)
Monocytes Absolute: 0.1 10*3/uL — ABNORMAL LOW (ref 0.2–0.9)
Monocytes Relative: 5 %
Neutro Abs: 1.4 10*3/uL (ref 1.4–6.5)
Neutrophils Relative %: 53 %
Platelets: 444 10*3/uL — ABNORMAL HIGH (ref 150–440)
RBC: 5.61 MIL/uL — ABNORMAL HIGH (ref 3.80–5.20)
RDW: 23.1 % — ABNORMAL HIGH (ref 11.5–14.5)
WBC: 2.6 10*3/uL — ABNORMAL LOW (ref 3.6–11.0)

## 2015-11-14 MED ORDER — WARFARIN SODIUM 3 MG PO TABS: 3.0000 mg | ORAL_TABLET | Freq: Every evening | ORAL | Status: DC

## 2015-11-19 ENCOUNTER — Encounter: Payer: Self-pay | Admitting: Surgery

## 2015-11-20 ENCOUNTER — Other Ambulatory Visit: Payer: Self-pay | Admitting: Hematology and Oncology

## 2015-11-21 ENCOUNTER — Other Ambulatory Visit: Payer: Medicare Other

## 2015-11-21 ENCOUNTER — Inpatient Hospital Stay: Payer: Medicare Other

## 2015-11-21 ENCOUNTER — Telehealth: Payer: Self-pay

## 2015-11-21 ENCOUNTER — Telehealth: Payer: Self-pay | Admitting: Surgery

## 2015-11-21 ENCOUNTER — Ambulatory Visit: Payer: Medicare Other

## 2015-11-21 DIAGNOSIS — D45 Polycythemia vera: Secondary | ICD-10-CM

## 2015-11-21 LAB — CBC WITH DIFFERENTIAL/PLATELET
Basophils Absolute: 0 10*3/uL (ref 0–0.1)
Basophils Relative: 1 %
Eosinophils Absolute: 0.1 10*3/uL (ref 0–0.7)
Eosinophils Relative: 3 %
HCT: 46 % (ref 35.0–47.0)
Hemoglobin: 14.8 g/dL (ref 12.0–16.0)
Lymphocytes Relative: 22 %
Lymphs Abs: 1.1 10*3/uL (ref 1.0–3.6)
MCH: 25.2 pg — ABNORMAL LOW (ref 26.0–34.0)
MCHC: 32.1 g/dL (ref 32.0–36.0)
MCV: 78.3 fL — ABNORMAL LOW (ref 80.0–100.0)
Monocytes Absolute: 0.2 10*3/uL (ref 0.2–0.9)
Monocytes Relative: 3 %
Neutro Abs: 3.7 10*3/uL (ref 1.4–6.5)
Neutrophils Relative %: 71 %
Platelets: 758 10*3/uL — ABNORMAL HIGH (ref 150–440)
RBC: 5.88 MIL/uL — ABNORMAL HIGH (ref 3.80–5.20)
RDW: 23.9 % — ABNORMAL HIGH (ref 11.5–14.5)
WBC: 5.2 10*3/uL (ref 3.6–11.0)

## 2015-11-21 LAB — PROTIME-INR
INR: 1.51
Prothrombin Time: 18.3 seconds — ABNORMAL HIGH (ref 11.4–15.0)

## 2015-11-21 NOTE — Telephone Encounter (Signed)
Patient is having left sided abdominal pain - on a pain scale of 1-10 she states it is an 8. Also had nausea and vomiting earlier today. Please call patient granddaughter, [name] reference same. Thanks.

## 2015-11-21 NOTE — Telephone Encounter (Signed)
Called pt per MD to ask medication dosage:  4 mg coumadin on Wednesday's and Sunday's.  3 mg all other days. Taking since last Wednesday  1500 Hydrea on Mon, Wed, and Fri.  1000 mg other days

## 2015-11-22 MED ORDER — POLYETHYLENE GLYCOL POWD: 17.0000 g | Freq: Every day | Status: DC

## 2015-11-22 NOTE — Telephone Encounter (Signed)
Returned phone call to Ms. Green at this time. She states that the patient has had intermittent left sided abdominal pain that feels like gas pains, then she will develop Nausea and vomiting shortly after the pain begins. She also says that the Colostomy has had decreased output during the period of abdominal pain. Only medication patient is currently taking for bowels is Colace 100mg  BID.  Spoke with Dr. Adonis Huguenin at this time. He would like patient to begin Miralax 17grams daily to help get bowels moving more consistantly. We will follow-up with patient in office next week.  Encouraged to call back with any further concerns.

## 2015-11-27 ENCOUNTER — Emergency Department
Admission: EM | Admit: 2015-11-27 | Discharge: 2015-11-27 | Disposition: A | Discharge status: Home / Self Care | Payer: Medicare Other | Attending: Emergency Medicine | Admitting: Emergency Medicine

## 2015-11-27 ENCOUNTER — Inpatient Hospital Stay: Payer: Medicare Other

## 2015-11-27 ENCOUNTER — Telehealth: Payer: Self-pay | Admitting: Surgery

## 2015-11-27 ENCOUNTER — Inpatient Hospital Stay: Payer: Medicare Other | Admitting: Hematology and Oncology

## 2015-11-27 ENCOUNTER — Emergency Department: Payer: Medicare Other

## 2015-11-27 DIAGNOSIS — K9409 Other complications of colostomy: Secondary | ICD-10-CM | POA: Insufficient documentation

## 2015-11-27 DIAGNOSIS — Z792 Long term (current) use of antibiotics: Secondary | ICD-10-CM | POA: Insufficient documentation

## 2015-11-27 DIAGNOSIS — I10 Essential (primary) hypertension: Secondary | ICD-10-CM | POA: Diagnosis not present

## 2015-11-27 DIAGNOSIS — Z7951 Long term (current) use of inhaled steroids: Secondary | ICD-10-CM | POA: Insufficient documentation

## 2015-11-27 DIAGNOSIS — Z7982 Long term (current) use of aspirin: Secondary | ICD-10-CM | POA: Diagnosis not present

## 2015-11-27 DIAGNOSIS — Z79899 Other long term (current) drug therapy: Secondary | ICD-10-CM | POA: Insufficient documentation

## 2015-11-27 DIAGNOSIS — R112 Nausea with vomiting, unspecified: Secondary | ICD-10-CM | POA: Insufficient documentation

## 2015-11-27 DIAGNOSIS — N390 Urinary tract infection, site not specified: Secondary | ICD-10-CM | POA: Insufficient documentation

## 2015-11-27 DIAGNOSIS — R1032 Left lower quadrant pain: Secondary | ICD-10-CM

## 2015-11-27 LAB — URINALYSIS COMPLETE WITH MICROSCOPIC (ARMC ONLY)
BILIRUBIN URINE: NEGATIVE
Cellular Cast, UA: 4
GLUCOSE, UA: NEGATIVE mg/dL
Hgb urine dipstick: NEGATIVE
Ketones, ur: NEGATIVE mg/dL
Nitrite: POSITIVE — AB
Protein, ur: 30 mg/dL — AB
Specific Gravity, Urine: 1.024 (ref 1.005–1.030)
Trans Epithel, UA: 1
pH: 5 (ref 5.0–8.0)

## 2015-11-27 LAB — CBC WITH DIFFERENTIAL/PLATELET
Basophils Absolute: 0.2 10*3/uL — ABNORMAL HIGH (ref 0–0.1)
Eosinophils Absolute: 0.2 10*3/uL (ref 0–0.7)
Eosinophils Relative: 4 %
HEMATOCRIT: 46.5 % (ref 35.0–47.0)
Hemoglobin: 14.1 g/dL (ref 12.0–16.0)
Lymphocytes Relative: 15 %
Lymphs Abs: 0.6 10*3/uL — ABNORMAL LOW (ref 1.0–3.6)
MCH: 24.3 pg — ABNORMAL LOW (ref 26.0–34.0)
MCHC: 30.4 g/dL — AB (ref 32.0–36.0)
MCV: 80 fL (ref 80.0–100.0)
MONO ABS: 0.1 10*3/uL — AB (ref 0.2–0.9)
NEUTROS ABS: 3.1 10*3/uL (ref 1.4–6.5)
Neutrophils Relative %: 75 %
Platelets: 587 10*3/uL — ABNORMAL HIGH (ref 150–440)
RBC: 5.82 MIL/uL — ABNORMAL HIGH (ref 3.80–5.20)
RDW: 24.1 % — AB (ref 11.5–14.5)
WBC: 4.2 10*3/uL (ref 3.6–11.0)

## 2015-11-27 LAB — COMPREHENSIVE METABOLIC PANEL
ALT: 11 U/L — ABNORMAL LOW (ref 14–54)
ANION GAP: 5 (ref 5–15)
AST: 23 U/L (ref 15–41)
Albumin: 3.3 g/dL — ABNORMAL LOW (ref 3.5–5.0)
Alkaline Phosphatase: 74 U/L (ref 38–126)
BILIRUBIN TOTAL: 1.1 mg/dL (ref 0.3–1.2)
BUN: 8 mg/dL (ref 6–20)
CO2: 27 mmol/L (ref 22–32)
Calcium: 8.9 mg/dL (ref 8.9–10.3)
Chloride: 107 mmol/L (ref 101–111)
Creatinine, Ser: 0.55 mg/dL (ref 0.44–1.00)
GFR calc Af Amer: >60 mL/min (ref >60)
Glucose, Bld: 89 mg/dL (ref 65–99)
POTASSIUM: 4.7 mmol/L (ref 3.5–5.1)
Sodium: 139 mmol/L (ref 135–145)
TOTAL PROTEIN: 7.5 g/dL (ref 6.5–8.1)

## 2015-11-27 MED ORDER — NITROFURANTOIN MACROCRYSTAL 100 MG PO CAPS: 100.0000 mg | ORAL_CAPSULE | Freq: Four times a day (QID) | ORAL | Status: DC

## 2015-11-27 MED ORDER — KETOROLAC TROMETHAMINE 30 MG/ML IJ SOLN
15.0000 mg | Freq: Once | INTRAMUSCULAR | Status: AC
  Administered 2015-11-27: 15 mg via INTRAVENOUS
  Filled 2015-11-27: qty 1

## 2015-11-27 MED ORDER — ONDANSETRON HCL 4 MG/2ML IJ SOLN
4.0000 mg | Freq: Once | INTRAMUSCULAR | Status: AC
  Administered 2015-11-27: 4 mg via INTRAVENOUS
  Filled 2015-11-27: qty 2

## 2015-11-27 NOTE — Telephone Encounter (Signed)
Please call patients granddaughter, [name]. Patient has an appointment on Thursday to see Dr Azalee Course, however the patient is experiencing very intense abdominal pain and her granddaughter would like to know what to do in the meantime. Please call and advise. Thanks.

## 2015-11-27 NOTE — ED Provider Notes (Signed)
Time Seen: Approximately *----------------------------------------- 1:10 PM on 11/27/2015 -----------------------------------------    I have reviewed the triage notes  Chief Complaint: No chief complaint on file.   History of Present Illness: Yolanda Wells is a 82 y.o. female who presents with onset of left lower quadrant abdominal pain which started this morning at approximately 10 AM. She has a history of left-sided colostomy and history of diverticulitis. She describes this pain is sharp and intermittent associated with transient nausea. She has vomited with no blood or bile. She does still have drainage from her colostomy site which is appearing normal. No melena or hematochezia. She denies any right-sided abdominal pain. She denies any pleuritic or positional component to her discomfort. There is been no noted fever and the patient was transported here by EMS uneventfully for evaluation.   Past Medical History  Diagnosis Date  . Hypertension   . Deep venous thrombosis (HCC)     right lower extremity  . History of hysterectomy   . History of bilateral hip replacements   . Polycythemia vera ([place])   . Dependent edema   . Arthritis   . Collagen vascular disease ([place])   . Stroke [facility])     Pt reports she was told she had a stroke in may    Patient Active Problem List   Diagnosis Date Noted  . Leukocytosis 09/25/2015  . Chest pain 09/25/2015  . COPD exacerbation ([place]) 09/24/2015  . Right lower lobe pneumonia 09/24/2015  . Dyspnea 09/22/2015  . Elevated troponin 09/15/2015  . UTI (lower urinary tract infection) 07/16/2015  . Abdominal wall cellulitis 07/15/2015  . DVT (deep venous thrombosis) ([place]) 07/15/2015  . HTN (hypertension) 07/15/2015  . Polycythemia vera ([place]) 07/15/2015  . Dependent edema 07/15/2015  . Arthritis 07/15/2015  . Pulmonary emboli ([place]) 05/25/2015  . Diverticulosis of colon with hemorrhage 04/02/2015    Past Surgical History  Procedure  Laterality Date  . Toe amputation      right  . Joint replacement      Left and Right Hip  . Abdominal hysterectomy    . Peripheral vascular catheterization N/A 04/02/2015    Procedure: Visceral Angiography;  Surgeon: Algernon Huxley, MD;  Location: [place] [facility];  Service: Cardiovascular;  Laterality: N/A;  . Peripheral vascular catheterization N/A 04/02/2015    Procedure: Visceral Artery Intervention;  Surgeon: Algernon Huxley, MD;  Location: [place] [facility];  Service: Cardiovascular;  Laterality: N/A;  . Colectomy with colostomy creation/hartmann procedure N/A 04/03/2015    Procedure: COLECTOMY WITH COLOSTOMY CREATION/HARTMANN PROCEDURE;  Surgeon: Sherri Rad, MD;  Location: [facility];  Service: General;  Laterality: N/A;  . Cardiac catheterization Right 04/03/2015    Procedure: CENTRAL LINE INSERTION;  Surgeon: Sherri Rad, MD;  Location: [facility];  Service: General;  Laterality: Right;  . Peripheral vascular catheterization N/A 05/25/2015    Procedure: IVC Filter Insertion;  Surgeon: Katha Cabal, MD;  Location: [place] [facility];  Service: Cardiovascular;  Laterality: N/A;    Past Surgical History  Procedure Laterality Date  . Toe amputation      right  . Joint replacement      Left and Right Hip  . Abdominal hysterectomy    . Peripheral vascular catheterization N/A 04/02/2015    Procedure: Visceral Angiography;  Surgeon: Algernon Huxley, MD;  Location: [place] [facility];  Service: Cardiovascular;  Laterality: N/A;  . Peripheral vascular catheterization N/A 04/02/2015    Procedure: Visceral Artery Intervention;  Surgeon:  Algernon Huxley, MD;  Location: [place] [facility];  Service: Cardiovascular;  Laterality: N/A;  . Colectomy with colostomy creation/hartmann procedure N/A 04/03/2015    Procedure: COLECTOMY WITH COLOSTOMY CREATION/HARTMANN PROCEDURE;  Surgeon: Sherri Rad, MD;  Location: [facility];  Service: General;  Laterality: N/A;  . Cardiac catheterization Right 04/03/2015     Procedure: CENTRAL LINE INSERTION;  Surgeon: Sherri Rad, MD;  Location: [facility];  Service: General;  Laterality: Right;  . Peripheral vascular catheterization N/A 05/25/2015    Procedure: IVC Filter Insertion;  Surgeon: Katha Cabal, MD;  Location: [place] [facility];  Service: Cardiovascular;  Laterality: N/A;    Current Outpatient Rx  Name  Route  Sig  Dispense  Refill  . aspirin EC 81 MG tablet   Oral   Take 1 tablet (81 mg total) by mouth daily.   30 tablet   2   . budesonide-formoterol (SYMBICORT) 160-4.5 MCG/ACT inhaler   Inhalation   Inhale 2 puffs into the lungs 2 (two) times daily.   1 Inhaler   12   . chlorpheniramine-HYDROcodone (TUSSIONEX) 10-8 MG/5ML SUER   Oral   Take 5 mLs by mouth every 12 (twelve) hours. Patient not taking: Reported on 11/07/2015   140 mL   0   . docusate sodium (COLACE) 100 MG capsule   Oral   Take 100 mg by mouth 2 (two) times daily as needed for mild constipation.          . folic acid (FOLVITE) A999333 MCG tablet   Oral   Take 400 mcg by mouth daily.         . furosemide (LASIX) 20 MG tablet   Oral   Take 20 mg by mouth daily as needed for edema.          [name] guaiFENesin (MUCINEX) 600 MG 12 hr tablet   Oral   Take 1 tablet (600 mg total) by mouth 2 (two) times daily. Patient not taking: Reported on 11/07/2015   20 tablet   0   . hydroxyurea (HYDREA) 500 MG capsule   Oral   Take 1,000-1,500 mg by mouth daily. Pt takes two capsules on Tuesday, Thursday, Saturday, and Sunday.   Pt takes three capsules on Monday, Wednesday, and Friday.         [name] ipratropium-albuterol (DUONEB) 0.5-2.5 (3) MG/3ML SOLN   Nebulization   Take 3 mLs by nebulization every 4 (four) hours. Patient taking differently: Take 3 mLs by nebulization every 4 (four) hours as needed (for wheezing/shortness of breath).    360 mL   6   . menthol-cetylpyridinium (CEPACOL) 3 MG lozenge   Oral   Take 1 lozenge (3 mg total) by mouth as needed for sore  throat. Patient not taking: Reported on 11/07/2015   100 tablet   12   . metoprolol tartrate (LOPRESSOR) 25 MG tablet   Oral   Take 1 tablet (25 mg total) by mouth 2 (two) times daily.   60 tablet   6   . mupirocin ointment (BACTROBAN) 2 %   Nasal   Place 1 application into the nose 2 (two) times daily.   22 g   0   . ondansetron (ZOFRAN) 4 MG tablet   Oral   Take 4 mg by mouth every 6 (six) hours as needed for nausea or vomiting.         [name] oxyCODONE-acetaminophen (PERCOCET/ROXICET) 5-325 MG tablet   Oral   Take 1 tablet by mouth  every 6 (six) hours as needed for severe pain.   20 tablet   0   . Polyethylene Glycol POWD   Oral   Take 17 g by mouth daily.   527 g   5   . potassium chloride SA (K-DUR,KLOR-CON) 20 MEQ tablet   Oral   Take 1 tablet (20 mEq total) by mouth 2 (two) times daily.   30 tablet   0   . warfarin (COUMADIN) 3 MG tablet   Oral   Take 1 tablet (3 mg total) by mouth every evening.   30 tablet   1   . warfarin (COUMADIN) 4 MG tablet   Oral   Take 1 tablet (4 mg total) by mouth daily at 6 PM. Patient not taking: Reported on 11/07/2015   15 tablet   0     Allergies:  Review of patient's allergies indicates no known allergies.  Family History: Family History  Problem Relation Age of Onset  . Heart attack Father   . Hypertension    . Arthritis-Osteo    . COPD Father   . Diabetes Brother   . Osteosarcoma Son     Social History: Social History  Substance Use Topics  . Smoking status: Never Smoker   . Smokeless tobacco: Never Used  . Alcohol Use: No     Review of Systems:   10 point review of systems was performed and was otherwise negative:  Constitutional: No fever Eyes: No visual disturbances ENT: No sore throat, ear pain Cardiac: No chest pain Respiratory: No shortness of breath, wheezing, or stridor Abdomen: Pain is left-sided sharp, persistent nausea and occasional vomiting, No diarrhea Endocrine: No weight loss,  No night sweats Extremities: No peripheral edema, cyanosis Skin: No rashes, easy bruising Neurologic: No focal weakness, trouble with speech or swollowing Urologic: No dysuria, Hematuria, or urinary frequency   Physical Exam:  ED Triage Vitals  Enc Vitals Group     BP --      Pulse --      Resp --      Temp --      Temp src --      SpO2 --      Weight --      Height --      Head Cir --      Peak Flow --      Pain Score --      Pain Loc --      Pain Edu? --      Excl. in [place]? --     General: Awake , Alert , and Oriented times 3; GCS 15 Head: Normal cephalic , atraumatic Eyes: Pupils equal , round, reactive to light Nose/Throat: No nasal drainage, patent upper airway without erythema or exudate.  Neck: Supple, Full range of motion, No anterior adenopathy or palpable thyroid masses Lungs: Clear to ascultation without wheezes , rhonchi, or rales Heart: Regular rate, regular rhythm without murmurs , gallops , or rubs Abdomen: No abdominal distention. Patient's had some transient left-sided pain which does not seem to be reproducible with direct palpation of the abdomen. Colostomy appears to have normal-appearing stool and is draining adequately. There is no rebound, guarding, rigidity, distention. No palpable masses     Extremities: 2 plus symmetric pulses. No edema, clubbing or cyanosis Neurologic: normal ambulation, Motor symmetric without deficits, sensory intact Skin: warm, dry, no rashes   Labs:   All laboratory work was reviewed including any pertinent negatives or positives listed below:  Labs Reviewed  CBC WITH DIFFERENTIAL/PLATELET  COMPREHENSIVE METABOLIC PANEL  URINALYSIS COMPLETEWITH MICROSCOPIC ([facility])   reviewed the patient's laboratory work shows a urinary tract infection Urine culture is pending   Radiology:    EXAM: CT ABDOMEN AND PELVIS WITHOUT CONTRAST  TECHNIQUE: Multidetector CT imaging of the abdomen and pelvis was performed following  the standard protocol without IV contrast.  COMPARISON: CT scan of November 07, 2015.  FINDINGS: Severe multilevel degenerative disc disease is noted in the lumbar spine. Stable scarring is noted posteriorly in both lung bases.  Cholelithiasis is noted. No focal abnormality is noted in the liver, spleen or pancreas on these unenhanced images. Adrenal glands appear normal. Stable large right renal cysts are noted. No definite hydronephrosis or renal obstruction is noted. IVC filter is noted. Atherosclerosis of abdominal aorta is noted without aneurysm formation. There is no evidence of bowel obstruction. Left lower quadrant colostomy is noted. No abnormal fluid collection is noted. Imaging of the pelvis is limited due to scatter artifact from bilateral total hip arthroplasties. No significant adenopathy is noted.  IMPRESSION: Cholelithiasis.  Stable large right renal cysts.  Atherosclerosis of abdominal aorta is noted without aneurysm formation.  Left lower quadrant colostomy is again noted.  No evidence bowel obstruction is noted.    I personally reviewed the radiologic studies    ED Course:  Differential diagnosis includes but is not exclusive to ovarian cyst, ovarian torsion, acute appendicitis, urinary tract infection, endometriosis, bowel obstruction, colitis, renal colic, gastroenteritis, etc. Given the patient's age clinical history and review of objective findings appears that she has a urinary tract infection. She'll be started up on Macrodantin and have a urine culture pending. All questions concerns were addressed at the bedside and the patient does not appear to be septic at this time.    Assessment:  Acute urinary tract infection   Final Clinical Impression: *  Final diagnoses:  Left lower quadrant pain     Plan: Outpatient management Patient was advised to return immediately if condition worsens. Patient was advised to follow up with their primary  care physician or other specialized physicians involved in their outpatient care            Daymon Larsen, MD 11/27/15 1851

## 2015-11-27 NOTE — Telephone Encounter (Signed)
Returned phone call to patient's grand-daughter. She states that patient is having left sided severe abdominal pain, Nausea and Vomiting. We had spoken last week about getting patient's bowels moving better and she states that the output from the ostomy is now normal but patient's pain has increased significantly today. She also states that patient is not holding down any food or even water this morning. Denies fever. States that home care nurse is in the home at this time and current BP and HR are:  167/104 and 81. The nurse states that she looks dehydrated at this time. The patient is saying in the background, "I am not going to the hospital, they never do anything and I end up sitting there for 8 hours." I explained that in order to get fluids and to find out what the source of the abdominal pain is, she would need to go to the ER. She seems a bit more receptive to the idea of going but still has not decided indefinitely if she will go today. The home care nurse is going to continue to encourage patient to go to ED at this time.

## 2015-11-27 NOTE — ED Notes (Signed)
Pt arrives via [facility] from [facility]  Pt and her family report that she has been experiencing left sided abdominal pain since 1030 this am  Pt with a colostomy since May 2016  Pt also reports nausea

## 2015-11-29 ENCOUNTER — Encounter: Payer: Self-pay | Admitting: Surgery

## 2015-11-29 ENCOUNTER — Ambulatory Visit (INDEPENDENT_AMBULATORY_CARE_PROVIDER_SITE_OTHER): Payer: Medicare Other | Admitting: Surgery

## 2015-11-29 ENCOUNTER — Telehealth: Payer: Self-pay

## 2015-11-29 VITALS — BP 147/90 | HR 78 | Temp 98.2°F | Wt 203.0 lb

## 2015-11-29 DIAGNOSIS — K5731 Diverticulosis of large intestine without perforation or abscess with bleeding: Secondary | ICD-10-CM | POA: Diagnosis not present

## 2015-11-29 DIAGNOSIS — N390 Urinary tract infection, site not specified: Secondary | ICD-10-CM | POA: Diagnosis not present

## 2015-11-29 DIAGNOSIS — R1010 Upper abdominal pain, unspecified: Secondary | ICD-10-CM | POA: Diagnosis not present

## 2015-11-29 DIAGNOSIS — R109 Unspecified abdominal pain: Secondary | ICD-10-CM

## 2015-11-29 MED ORDER — PANTOPRAZOLE SODIUM 40 MG PO TBEC: 40.0000 mg | DELAYED_RELEASE_TABLET | Freq: Every day | ORAL | Status: DC

## 2015-11-29 MED ORDER — ONDANSETRON 4 MG PO TBDP: 4.0000 mg | ORAL_TABLET | Freq: Three times a day (TID) | ORAL | Status: DC | PRN

## 2015-11-29 MED ORDER — KETOROLAC TROMETHAMINE 10 MG PO TABS: 10.0000 mg | ORAL_TABLET | Freq: Four times a day (QID) | ORAL | Status: DC

## 2015-11-29 NOTE — Progress Notes (Signed)
Subjective:     Patient ID: Yolanda Wells, female   DOB: [birth year], 82 y.o.   MRN: [id number]  HPI  82 year old female s/p Hartman's procedure for diverticular bleed in August 2016 with Dr. Felton Clinton.  She was on to a skilled nursing facility. She had decreased mobility and is wheelchair bound except for small transferring distances. Patient states that she has been in pain in her left flank off and on since that time. For the last 4 days she has had nausea and vomiting. Patient and daughter are here and very frustrated because they've been to the emergency department multiple times for this left flank pain that comes around with pain into the groin, without anyone finding a cause. Did have a CT scan 2 days ago when she went to [facility], which showed normal bowel new issues with her colostomy noted inflammation or fluid collections. Patient states that the pain comes on in the left back and feels like a gas type pain that slowly builds that comes and goes in waves phase as the pain builds and get worse that the pain will move around to her left groin. Patient did have a UTI when seen in the emergency department with greater than 100,000 colonizing units, as well as white blood cells red blood cells leukocyte esterase and nitrates are positive on her UA.  Patient denies any fever chills dysuria urgency frequency, chest pain, shortness of breath, other abdominal pain, with any foods, constipation, diarrhea.  Past Medical History  Diagnosis Date  . Hypertension   . Deep venous thrombosis (HCC)     right lower extremity  . History of hysterectomy   . History of bilateral hip replacements   . Polycythemia vera ([place])   . Dependent edema   . Arthritis   . Collagen vascular disease ([place])   . Stroke [facility])     Pt reports she was told she had a stroke in may   Past Surgical History  Procedure Laterality Date  . Toe amputation      right  . Joint replacement      Left and Right Hip   . Abdominal hysterectomy    . Peripheral vascular catheterization N/A 04/02/2015    Procedure: Visceral Angiography;  Surgeon: Algernon Huxley, MD;  Location: [place] [facility];  Service: Cardiovascular;  Laterality: N/A;  . Peripheral vascular catheterization N/A 04/02/2015    Procedure: Visceral Artery Intervention;  Surgeon: Algernon Huxley, MD;  Location: [place] [facility];  Service: Cardiovascular;  Laterality: N/A;  . Colectomy with colostomy creation/hartmann procedure N/A 04/03/2015    Procedure: COLECTOMY WITH COLOSTOMY CREATION/HARTMANN PROCEDURE;  Surgeon: Sherri Rad, MD;  Location: [facility];  Service: General;  Laterality: N/A;  . Cardiac catheterization Right 04/03/2015    Procedure: CENTRAL LINE INSERTION;  Surgeon: Sherri Rad, MD;  Location: [facility];  Service: General;  Laterality: Right;  . Peripheral vascular catheterization N/A 05/25/2015    Procedure: IVC Filter Insertion;  Surgeon: Katha Cabal, MD;  Location: [place] [facility];  Service: Cardiovascular;  Laterality: N/A;   Family History  Problem Relation Age of Onset  . Heart attack Father   . Hypertension    . Arthritis-Osteo    . COPD Father   . Diabetes Brother   . Osteosarcoma Son    Social History   Social History  . Marital Status: Widowed    Spouse Name: N/A  . Number of Children: N/A  . Years of Education:  N/A   Social History Main Topics  . Smoking status: Never Smoker   . Smokeless tobacco: Never Used  . Alcohol Use: No  . Drug Use: No  . Sexual Activity: No   Other Topics Concern  . None   Social History Narrative   Lives at [facility]    Current outpatient prescriptions:  .  aspirin EC 81 MG tablet, Take 1 tablet (81 mg total) by mouth daily., Disp: 30 tablet, Rfl: 2 .  docusate sodium (COLACE) 100 MG capsule, Take 100 mg by mouth 2 (two) times daily as needed for mild constipation. , Disp: , Rfl:  .  folic acid (FOLVITE) A999333 MCG tablet, Take 400 mcg by mouth daily., Disp: , Rfl:  .   furosemide (LASIX) 20 MG tablet, Take 20 mg by mouth daily as needed for edema. , Disp: , Rfl:  .  hydroxyurea (HYDREA) 500 MG capsule, Take 1,000-1,500 mg by mouth daily. Pt takes two capsules on Tuesday, Thursday, Saturday, and Sunday.   Pt takes three capsules on Monday, Wednesday, and Friday., Disp: , Rfl:  .  ipratropium-albuterol (DUONEB) 0.5-2.5 (3) MG/3ML SOLN, Take 3 mLs by nebulization every 4 (four) hours. (Patient taking differently: Take 3 mLs by nebulization every 4 (four) hours as needed (for wheezing/shortness of breath). ), Disp: 360 mL, Rfl: 6 .  menthol-cetylpyridinium (CEPACOL) 3 MG lozenge, Take 1 lozenge (3 mg total) by mouth as needed for sore throat., Disp: 100 tablet, Rfl: 12 .  mupirocin ointment (BACTROBAN) 2 %, Place 1 application into the nose 2 (two) times daily., Disp: 22 g, Rfl: 0 .  nitrofurantoin (MACRODANTIN) 100 MG capsule, Take 1 capsule (100 mg total) by mouth 4 (four) times daily., Disp: 28 capsule, Rfl: 0 .  ondansetron (ZOFRAN) 4 MG tablet, Take 4 mg by mouth every 6 (six) hours as needed for nausea or vomiting., Disp: , Rfl:  .  oxyCODONE-acetaminophen (PERCOCET/ROXICET) 5-325 MG tablet, Take 1 tablet by mouth every 6 (six) hours as needed for severe pain., Disp: 20 tablet, Rfl: 0 .  Polyethylene Glycol POWD, Take 17 g by mouth daily., Disp: 527 g, Rfl: 5 .  potassium chloride SA (K-DUR,KLOR-CON) 20 MEQ tablet, Take 1 tablet (20 mEq total) by mouth 2 (two) times daily., Disp: 30 tablet, Rfl: 0 .  warfarin (COUMADIN) 3 MG tablet, Take 1 tablet (3 mg total) by mouth every evening., Disp: 30 tablet, Rfl: 1 .  warfarin (COUMADIN) 4 MG tablet, Take 1 tablet (4 mg total) by mouth daily at 6 PM., Disp: 15 tablet, Rfl: 0 .  ketorolac (TORADOL) 10 MG tablet, Take 1 tablet (10 mg total) by mouth every 6 (six) hours., Disp: 20 tablet, Rfl: 0 .  metoprolol tartrate (LOPRESSOR) 25 MG tablet, Take 1 tablet (25 mg total) by mouth 2 (two) times daily. (Patient not taking:  Reported on 11/29/2015), Disp: 60 tablet, Rfl: 6 .  ondansetron (ZOFRAN-ODT) 4 MG disintegrating tablet, Take 1 tablet (4 mg total) by mouth every 8 (eight) hours as needed for nausea or vomiting., Disp: 20 tablet, Rfl: 0 .  pantoprazole (PROTONIX) 40 MG tablet, Take 1 tablet (40 mg total) by mouth daily., Disp: 30 tablet, Rfl: 0 No Known Allergies    Review of Systems  Constitutional: Positive for appetite change and fatigue. Negative for fever, chills, activity change and unexpected weight change.  HENT: Negative for congestion and sore throat.   Respiratory: Negative for cough, shortness of breath and wheezing.   Cardiovascular: Negative for chest  pain, palpitations and leg swelling.  Gastrointestinal: Positive for nausea, vomiting and abdominal pain. Negative for diarrhea, constipation, blood in stool, abdominal distention and rectal pain.  Genitourinary: Positive for hematuria and flank pain. Negative for dysuria, urgency and difficulty urinating.  Musculoskeletal: Positive for back pain. Negative for joint swelling and neck pain.  Skin: Negative for color change, pallor, rash and wound.  Neurological: Negative for dizziness and weakness.  Hematological: Negative for adenopathy. Does not bruise/bleed easily.  Psychiatric/Behavioral: Negative for agitation. The patient is not nervous/anxious.   All other systems reviewed and are negative.      Filed Vitals:   11/29/15 0944  BP: 147/90  Pulse: 78  Temp: 98.2 F (36.8 C)    Objective:   Physical Exam  Constitutional: She is oriented to person, place, and time. She appears well-developed and well-nourished. No distress.  HENT:  Head: Normocephalic and atraumatic.  Left Ear: External ear normal.  Nose: Nose normal.  Mouth/Throat: Oropharynx is clear and moist.  Eyes: Conjunctivae and EOM are normal. Pupils are equal, round, and reactive to light. No scleral icterus.  Neck: Normal range of motion. Neck supple. No tracheal  deviation present.  Cardiovascular: Normal rate, regular rhythm, normal heart sounds and intact distal pulses.   Pulmonary/Chest: Effort normal and breath sounds normal. No respiratory distress. She has no wheezes. She has no rales.  Abdominal: Soft. Bowel sounds are normal. She exhibits no distension and no mass. There is no rebound and no guarding.  LLQ ostomy site, pink, patent, with air and stool in the bag, no erythma, no signs of infection, no tenderness.   + Left CVA tender, some tenderness in Left groin but no hernia.    Neurological: She is alert and oriented to person, place, and time. No cranial nerve deficit.  Skin: Skin is warm and dry. No rash noted. No erythema.  Psychiatric: She has a normal mood and affect. Her behavior is normal. Judgment and thought content normal.  Vitals reviewed.      Assessment:     82 year old female with left flank pain    Plan:     I have personally reviewed her past medical history including her hospital stay back in August, as well as her recent ED visits.  I have personally reviewed her laboratory values show a normal white blood cell count, a UA with signs of a UTI as well as a urinary culture with greater than 100,000 colony units of gram negatives however speciation has not been done yet. I have personally reviewed her CT scan images from her last 3 CT scans. These have shown normal colon without any inflammation normal left ostomy without any blockage no small bowel obstructions and no adhesions. In light of this and her pain all of which is in her left flank coming around to the groin with a positive UA and UTI concern for a possible renal stone versus pyelonephritis, however she doesn't have a low white blood cell count this time. She is on Macrobid and advised her to continue this. I have started her on Toradol for 5 days to help with the pain, ordered a renal ultrasound, and we'll get her a consult with urology.  Advancement greater than 50  minutes discussing educating and coordinating care for this patient.

## 2015-11-29 NOTE — Telephone Encounter (Signed)
Patient has been scheduled for Appointment with Yolanda Wells at [facility] on 12/06/15 at 0830 for Left Flank Pain radiating to back and recent UTI.  Please place referral so that they may attach appointment

## 2015-11-29 NOTE — Patient Instructions (Addendum)
We have ordered a renal Ultrasound. You will have this done on 12/05/15 at [facility]. Please arrive at 0945 to the Admitting and Registration desk.  We also have set-up an appointment for you to see [facility] will be seeing Yolanda Moors, FNP on 12/06/15 at 0830.  Your medications have been sent to your [place].  We will call you if there is any further need for follow-up.

## 2015-11-30 LAB — URINE CULTURE: Culture: >=100000

## 2015-11-30 NOTE — Telephone Encounter (Signed)
Referral has been entered into EPIC.

## 2015-12-01 NOTE — Progress Notes (Signed)
ED Culture Report:    [id number] Deharo,Robinette  D/C date: 11/27/2015 7:35:00PM   DCAntibiotic: NITROFURANTOIN MACROCRYSTAL 100 MG PO CAPS;Take 1 capsule (100 mg total) by mouth 4 (four) times daily. 28 capsule  Order ID Spec Taken DtTm Result DtTm [id number] URINE CULTURE 11/27/2015 4:35:00PM 11/30/2015 7:49:00AM [facility]: Culture: >=100,000 COLONIES/mL PROVIDENCIA STUARTII Org,Bact: PROVIDENCIA STUARTII    513 AMPICILLIN Resistant >=32 RESISTANT 518 CEFTRIAXONE Sensitive <=1 SENSITIVE 519 CEFTAZIDIME Sensitive <=1 SENSITIVE 525 CEFAZOLIN Resistant >=64 RESISTANT 528 CIPROFLOXACIN Resistant >=4 RESISTANT 539 GENTAMICIN Resistant 4 RESISTANT 541 IMIPENEM Sensitive 1 SENSITIVE 549 NITROFURANTOIN Resistant 556 PIP/TAZO Sensitive 571 TRIMETH/SULFA Sensitive <=20 SENSITIVE  Patient discharged on 11/27/15 with a prescription for Nitrofurantoin.  ED Culture Report showed urine cx positive for Providencia Stuartii that is resistant to Nitrofurantoin.  Called ED nurse [place] with recommendation for Septra DS 1 tablet PO BID for 7 days.    Olivia Canter, [facility] Clinical Pharmacist 12/01/2015

## 2015-12-03 ENCOUNTER — Telehealth: Payer: Self-pay | Admitting: Emergency Medicine

## 2015-12-03 NOTE — ED Notes (Signed)
[place] daughter called and says she was left message bout changing rx.  Per pharmacy note will change to septra ds 1 tablet twice daily for 7 days. And stop nitrofurantoin.  Called to [employer].

## 2015-12-04 ENCOUNTER — Inpatient Hospital Stay: Payer: Medicare Other

## 2015-12-04 ENCOUNTER — Other Ambulatory Visit: Payer: Self-pay | Admitting: Hematology and Oncology

## 2015-12-04 DIAGNOSIS — D45 Polycythemia vera: Secondary | ICD-10-CM

## 2015-12-04 LAB — CBC WITH DIFFERENTIAL/PLATELET
Basophils Absolute: 0 K/uL (ref 0–0.1)
Basophils Relative: 1 %
Eosinophils Absolute: 0.1 K/uL (ref 0–0.7)
Eosinophils Relative: 3 %
HCT: 43.5 % (ref 35.0–47.0)
Hemoglobin: 13.9 g/dL (ref 12.0–16.0)
Lymphocytes Relative: 24 %
Lymphs Abs: 1 K/uL (ref 1.0–3.6)
MCH: 24.9 pg — ABNORMAL LOW (ref 26.0–34.0)
MCHC: 31.9 g/dL — ABNORMAL LOW (ref 32.0–36.0)
MCV: 78.2 fL — ABNORMAL LOW (ref 80.0–100.0)
Monocytes Absolute: 0.1 K/uL — ABNORMAL LOW (ref 0.2–0.9)
Monocytes Relative: 3 %
Neutro Abs: 2.8 K/uL (ref 1.4–6.5)
Neutrophils Relative %: 69 %
Platelets: 677 K/uL — ABNORMAL HIGH (ref 150–440)
RBC: 5.57 MIL/uL — ABNORMAL HIGH (ref 3.80–5.20)
RDW: 23.8 % — ABNORMAL HIGH (ref 11.5–14.5)
WBC: 4.1 K/uL (ref 3.6–11.0)

## 2015-12-04 LAB — COMPREHENSIVE METABOLIC PANEL
ALT: <5 U/L — ABNORMAL LOW (ref 14–54)
AST: 12 U/L — ABNORMAL LOW (ref 15–41)
Albumin: 3.1 g/dL — ABNORMAL LOW (ref 3.5–5.0)
Alkaline Phosphatase: 83 U/L (ref 38–126)
Anion gap: 5 (ref 5–15)
BUN: 9 mg/dL (ref 6–20)
CO2: 24 mmol/L (ref 22–32)
Calcium: 8.6 mg/dL — ABNORMAL LOW (ref 8.9–10.3)
Chloride: 105 mmol/L (ref 101–111)
Creatinine, Ser: 0.64 mg/dL (ref 0.44–1.00)
GFR calc Af Amer: >60 mL/min (ref >60)
GFR calc non Af Amer: >60 mL/min (ref >60)
Glucose, Bld: 106 mg/dL — ABNORMAL HIGH (ref 65–99)
Potassium: 4.3 mmol/L (ref 3.5–5.1)
Sodium: 134 mmol/L — ABNORMAL LOW (ref 135–145)
Total Bilirubin: 0.3 mg/dL (ref 0.3–1.2)
Total Protein: 7.2 g/dL (ref 6.5–8.1)

## 2015-12-04 LAB — PROTIME-INR
INR: 1.84
Prothrombin Time: 21.2 seconds — ABNORMAL HIGH (ref 11.4–15.0)

## 2015-12-04 LAB — SEDIMENTATION RATE: Sed Rate: 3 mm/hr (ref 0–30)

## 2015-12-04 LAB — FERRITIN: Ferritin: 15 ng/mL (ref 11–307)

## 2015-12-05 ENCOUNTER — Telehealth: Payer: Self-pay | Admitting: Surgery

## 2015-12-05 ENCOUNTER — Ambulatory Visit: Payer: Medicare Other

## 2015-12-05 ENCOUNTER — Telehealth: Payer: Self-pay

## 2015-12-05 NOTE — Telephone Encounter (Signed)
Called patient's grand-daughter once again. No answer. Left voicemail on both numbers asking for a return phone call. Also, informed that appointment that we had set-up with Urology has been cancelled by Grand-daughter as well. Patient can return to PCP for further work-up in regards to her abdominal pain.

## 2015-12-05 NOTE — Telephone Encounter (Signed)
Called pt per MD request to confirm coumadin dosage, spoke with Granddaughter and per granddaughter [name] pt takes 4 mg coumadin on Wednesday and sun.  3 mg all other days.  Also per [name] pt on Bactrim and bruising easier, blows nose and has some nose bleeds.  Per Granddaughter pt's pharmacy asked her hold.  Informing MD to please advise.  No other concerns noted

## 2015-12-05 NOTE — Telephone Encounter (Signed)
Called both numbers for Yolanda Wells and [name] [employer]) to find out what happened with this and how to reschedule. No answer at either number. Left voicemail on both numbers for a return phone call.

## 2015-12-05 NOTE — Telephone Encounter (Signed)
Kim for the ultrasound department at [facility] called to let us know patient did not show for her appt this morning.

## 2015-12-06 ENCOUNTER — Telehealth: Payer: Self-pay | Admitting: Hematology and Oncology

## 2015-12-06 ENCOUNTER — Other Ambulatory Visit: Payer: Self-pay

## 2015-12-06 ENCOUNTER — Telehealth: Payer: Self-pay

## 2015-12-06 ENCOUNTER — Ambulatory Visit: Payer: Medicare Other | Admitting: Obstetrics and Gynecology

## 2015-12-06 DIAGNOSIS — I82409 Acute embolism and thrombosis of unspecified deep veins of unspecified lower extremity: Secondary | ICD-10-CM

## 2015-12-06 NOTE — Telephone Encounter (Signed)
Erroneous note encounter  

## 2015-12-06 NOTE — Telephone Encounter (Signed)
Re:  PT/INR  INR on 11/21/2015 1.51 and INR 1.84 on 12/04/2015  Patient seen in ER on 11/27/2015 with a UTI and prescribed nitrofurantoin  Notes in Epic from pharmacy regarding resistance to nitrofurantoin and suggestion for Septra on 12/01/2015  Call from nurse to [facility] on 12/03/2015 for 7 days of Septra  Current dose Coumadin 4 mg 2 days a week and 3 mg 5 days a week (total weekly dose 23 mg).  Recommendation per UptoDate decrease dose 10-20% (2-4 mg).  New dose:  Coumadin 3 mg 5 days a week and 2 mg 2 days a week (total weekly dose 19 mg).  Check INR twice a week to confirm level not supra-therapeutic or under dosing.  Confirm with grandaughter above dates and start of Spetra.  Lequita Asal, MD

## 2015-12-06 NOTE — Telephone Encounter (Signed)
Called and spoke with pt's granddaughter ([name]) per pts request regarding new coumadin dosage.  Per Dr. Mike Gip pt to start Coumadin 3 mg 5 days a week and 2 mg other 2 days.  Recheck labs on Friday and Tuesday next week.  Also spoke with pt increasing Phlebotomy amount.  Pt was agreeable to that plan with no other concerns noted.

## 2015-12-07 ENCOUNTER — Inpatient Hospital Stay: Payer: Medicare Other

## 2015-12-07 DIAGNOSIS — D45 Polycythemia vera: Secondary | ICD-10-CM

## 2015-12-07 DIAGNOSIS — I82409 Acute embolism and thrombosis of unspecified deep veins of unspecified lower extremity: Secondary | ICD-10-CM

## 2015-12-07 LAB — CBC WITH DIFFERENTIAL/PLATELET
Basophils Absolute: 0 10*3/uL (ref 0–0.1)
Basophils Relative: 1 %
Eosinophils Absolute: 0.1 10*3/uL (ref 0–0.7)
Eosinophils Relative: 2 %
HCT: 43 % (ref 35.0–47.0)
Hemoglobin: 13.7 g/dL (ref 12.0–16.0)
Lymphocytes Relative: 22 %
Lymphs Abs: 1 10*3/uL (ref 1.0–3.6)
MCH: 25.1 pg — ABNORMAL LOW (ref 26.0–34.0)
MCHC: 31.8 g/dL — ABNORMAL LOW (ref 32.0–36.0)
MCV: 79.1 fL — ABNORMAL LOW (ref 80.0–100.0)
Monocytes Absolute: 0.1 10*3/uL — ABNORMAL LOW (ref 0.2–0.9)
Monocytes Relative: 2 %
Neutro Abs: 3.4 10*3/uL (ref 1.4–6.5)
Neutrophils Relative %: 73 %
Platelets: 656 10*3/uL — ABNORMAL HIGH (ref 150–440)
RBC: 5.44 MIL/uL — ABNORMAL HIGH (ref 3.80–5.20)
RDW: 24.5 % — ABNORMAL HIGH (ref 11.5–14.5)
WBC: 4.7 10*3/uL (ref 3.6–11.0)

## 2015-12-07 LAB — PROTIME-INR
INR: 1.7
Prothrombin Time: 20 seconds — ABNORMAL HIGH (ref 11.4–15.0)

## 2015-12-08 ENCOUNTER — Telehealth: Payer: Self-pay | Admitting: Hematology and Oncology

## 2015-12-08 ENCOUNTER — Other Ambulatory Visit: Payer: Self-pay | Admitting: Hematology and Oncology

## 2015-12-08 NOTE — Telephone Encounter (Signed)
Re:  PT/INR  Called regarding INR of 1.7 on 12/07/2015.  She had recently decreased her dose secondary to concerns about interaction with Coumadin.  Because of the decreasing INR, Coumadin will be increased to 3 mg a day until prescription complete (Sunday), then back to prior dosing of 3 mg 5 days a week and 4 mg 2 days a week.  As long as her INR is increasing at next check, her Coumadin dose will not be adjusted.  Lequita Asal, MD

## 2015-12-11 ENCOUNTER — Inpatient Hospital Stay (HOSPITAL_BASED_OUTPATIENT_CLINIC_OR_DEPARTMENT_OTHER): Payer: Medicare Other | Admitting: Hematology and Oncology

## 2015-12-11 ENCOUNTER — Inpatient Hospital Stay: Payer: Medicare Other

## 2015-12-11 ENCOUNTER — Other Ambulatory Visit: Payer: Self-pay | Admitting: Hematology and Oncology

## 2015-12-11 VITALS — BP 122/81 | HR 80 | Temp 97.1°F | Resp 18 | Ht 65.0 in

## 2015-12-11 DIAGNOSIS — Z86718 Personal history of other venous thrombosis and embolism: Secondary | ICD-10-CM

## 2015-12-11 DIAGNOSIS — R262 Difficulty in walking, not elsewhere classified: Secondary | ICD-10-CM

## 2015-12-11 DIAGNOSIS — Z7982 Long term (current) use of aspirin: Secondary | ICD-10-CM

## 2015-12-11 DIAGNOSIS — I82409 Acute embolism and thrombosis of unspecified deep veins of unspecified lower extremity: Secondary | ICD-10-CM

## 2015-12-11 DIAGNOSIS — M1990 Unspecified osteoarthritis, unspecified site: Secondary | ICD-10-CM

## 2015-12-11 DIAGNOSIS — D45 Polycythemia vera: Secondary | ICD-10-CM

## 2015-12-11 DIAGNOSIS — R5382 Chronic fatigue, unspecified: Secondary | ICD-10-CM | POA: Diagnosis not present

## 2015-12-11 DIAGNOSIS — Z7901 Long term (current) use of anticoagulants: Secondary | ICD-10-CM

## 2015-12-11 DIAGNOSIS — Z79899 Other long term (current) drug therapy: Secondary | ICD-10-CM

## 2015-12-11 DIAGNOSIS — Z933 Colostomy status: Secondary | ICD-10-CM

## 2015-12-11 DIAGNOSIS — Z8719 Personal history of other diseases of the digestive system: Secondary | ICD-10-CM

## 2015-12-11 DIAGNOSIS — I10 Essential (primary) hypertension: Secondary | ICD-10-CM

## 2015-12-11 DIAGNOSIS — Z9181 History of falling: Secondary | ICD-10-CM

## 2015-12-11 DIAGNOSIS — Z87440 Personal history of urinary (tract) infections: Secondary | ICD-10-CM

## 2015-12-11 LAB — CBC WITH DIFFERENTIAL/PLATELET
Basophils Absolute: 0.1 10*3/uL (ref 0–0.1)
Basophils Relative: 1 %
Eosinophils Absolute: 0.1 10*3/uL (ref 0–0.7)
Eosinophils Relative: 2 %
HCT: 42.7 % (ref 35.0–47.0)
Hemoglobin: 13.8 g/dL (ref 12.0–16.0)
Lymphocytes Relative: 25 %
Lymphs Abs: 1.2 10*3/uL (ref 1.0–3.6)
MCH: 25.5 pg — ABNORMAL LOW (ref 26.0–34.0)
MCHC: 32.4 g/dL (ref 32.0–36.0)
MCV: 78.9 fL — ABNORMAL LOW (ref 80.0–100.0)
Monocytes Absolute: 0.2 10*3/uL (ref 0.2–0.9)
Monocytes Relative: 3 %
Neutro Abs: 3.2 10*3/uL (ref 1.4–6.5)
Neutrophils Relative %: 69 %
Platelets: 612 10*3/uL — ABNORMAL HIGH (ref 150–440)
RBC: 5.41 MIL/uL — ABNORMAL HIGH (ref 3.80–5.20)
RDW: 24.8 % — ABNORMAL HIGH (ref 11.5–14.5)
WBC: 4.8 10*3/uL (ref 3.6–11.0)

## 2015-12-11 LAB — PROTIME-INR
INR: 1.99
Prothrombin Time: 22.5 seconds — ABNORMAL HIGH (ref 11.4–15.0)

## 2015-12-14 ENCOUNTER — Encounter: Payer: Self-pay | Admitting: Hematology and Oncology

## 2015-12-14 NOTE — Progress Notes (Signed)
[facility] day:  12/11/2015  Chief Complaint: Yolanda Wells is a 82 y.o. female with polycythemia rubra vera (PV) who is seen for 1 month assessment on hydroxyurea and ongoing phlebotomies.  HPI:   The patient was last seen in the medical oncology clinic on 10/30/2015.  At that time, she required assistance with activities of daily living.  She recently fell.  She was having trouble with her colostomy. Exam revealed no appreciable hepatosplenomegaly. Labs included a hematocrit of 49.2, hemoglobin 15.4, platelets 960,000, and WBC 7,800 with an ANC of 5,600.  INR was 2.41.  She underwent phlebotomy of 300 cc.  Hydroxyurea was increased to 1500 mg Mondays, Wednesdays, and Fridays and 1000 mg Tuesday, Thursdays, Saturdays, and Sundays.   During the interim, she has continued to require weekly phlebotomies.  Her INR has been monitored in our clinic per patient request.  Her Coumadin has recently been adjusted secondary to a prescription for Septra for a UTI.  She recently completed her antibiotics.  Current Coumadin dose is 3 mg a day (prior to being on antibiotics Coumadin was 4 mg 2 days a week and 3 mg 5 days a week with a slightly sub-therapeutic INR of 1.84).  She notes that she has difficulty ambulating secondary to her knee.  She says she has "bone on bone".  She is discouraged as she was told she could not have her colostomy reversed secondary to her general health.    She denies any bleeding.  She has a bruise on her breast.  Past Medical History  Diagnosis Date  . Hypertension   . Deep venous thrombosis (HCC)     right lower extremity  . History of hysterectomy   . History of bilateral hip replacements   . Polycythemia vera ([place])   . Dependent edema   . Arthritis   . Collagen vascular disease ([place])   . Stroke [facility])     Pt reports she was told she had a stroke in may    Past Surgical History  Procedure Laterality Date  . Toe  amputation      right  . Joint replacement      Left and Right Hip  . Abdominal hysterectomy    . Peripheral vascular catheterization N/A 04/02/2015    Procedure: Visceral Angiography;  Surgeon: Algernon Huxley, MD;  Location: [place] [facility];  Service: Cardiovascular;  Laterality: N/A;  . Peripheral vascular catheterization N/A 04/02/2015    Procedure: Visceral Artery Intervention;  Surgeon: Algernon Huxley, MD;  Location: [place] [facility];  Service: Cardiovascular;  Laterality: N/A;  . Colectomy with colostomy creation/hartmann procedure N/A 04/03/2015    Procedure: COLECTOMY WITH COLOSTOMY CREATION/HARTMANN PROCEDURE;  Surgeon: Sherri Rad, MD;  Location: [facility] ORS;  Service: General;  Laterality: N/A;  . Cardiac catheterization Right 04/03/2015    Procedure: CENTRAL LINE INSERTION;  Surgeon: Sherri Rad, MD;  Location: [facility] ORS;  Service: General;  Laterality: Right;  . Peripheral vascular catheterization N/A 05/25/2015    Procedure: IVC Filter Insertion;  Surgeon: Katha Cabal, MD;  Location: [place] [facility];  Service: Cardiovascular;  Laterality: N/A;    Family History  Problem Relation Age of Onset  . Heart attack Father   . Hypertension    . Arthritis-Osteo    . COPD Father   . Diabetes Brother   . Osteosarcoma Son     Social History:  reports that she has never smoked. She has  never used smokeless tobacco. She reports that she does not drink alcohol or use illicit drugs.  She lives alone on [address] at [place] (independent living). Her grand-daughter visits regularly [name] [phone]).  The patient is accompanied by her grand-daughter today.  Allergies: No Known Allergies  Current Medications: Current Outpatient Prescriptions  Medication Sig Dispense Refill  . aspirin EC 81 MG tablet Take 1 tablet (81 mg total) by mouth daily. 30 tablet 2  . docusate sodium (COLACE) 100 MG capsule Take 100 mg by mouth 2 (two) times daily as needed for mild constipation.      . folic acid (FOLVITE) A999333 MCG tablet Take 400 mcg by mouth daily.    . furosemide (LASIX) 20 MG tablet Take 20 mg by mouth daily as needed for edema.     . hydroxyurea (HYDREA) 500 MG capsule Take 1,000-1,500 mg by mouth daily. Pt takes two capsules on Tuesday, Thursday, Saturday, and Sunday.   Pt takes three capsules on Monday, Wednesday, and Friday.    [name] ipratropium-albuterol (DUONEB) 0.5-2.5 (3) MG/3ML SOLN Take 3 mLs by nebulization every 4 (four) hours. (Patient taking differently: Take 3 mLs by nebulization every 4 (four) hours as needed (for wheezing/shortness of breath). ) 360 mL 6  . ketorolac (TORADOL) 10 MG tablet Take 1 tablet (10 mg total) by mouth every 6 (six) hours. 20 tablet 0  . menthol-cetylpyridinium (CEPACOL) 3 MG lozenge Take 1 lozenge (3 mg total) by mouth as needed for sore throat. 100 tablet 12  . metoprolol tartrate (LOPRESSOR) 25 MG tablet Take 1 tablet (25 mg total) by mouth 2 (two) times daily. 60 tablet 6  . mupirocin ointment (BACTROBAN) 2 % Place 1 application into the nose 2 (two) times daily. 22 g 0  . ondansetron (ZOFRAN) 4 MG tablet Take 4 mg by mouth every 6 (six) hours as needed for nausea or vomiting.    . ondansetron (ZOFRAN-ODT) 4 MG disintegrating tablet Take 1 tablet (4 mg total) by mouth every 8 (eight) hours as needed for nausea or vomiting. 20 tablet 0  . oxyCODONE-acetaminophen (PERCOCET/ROXICET) 5-325 MG tablet Take 1 tablet by mouth every 6 (six) hours as needed for severe pain. 20 tablet 0  . pantoprazole (PROTONIX) 40 MG tablet Take 1 tablet (40 mg total) by mouth daily. 30 tablet 0  . Polyethylene Glycol POWD Take 17 g by mouth daily. 527 g 5  . potassium chloride SA (K-DUR,KLOR-CON) 20 MEQ tablet Take 1 tablet (20 mEq total) by mouth 2 (two) times daily. 30 tablet 0  . warfarin (COUMADIN) 2 MG tablet Take 2 mg by mouth daily. Sunday and Wednesday    . warfarin (COUMADIN) 3 MG tablet Take 1 tablet (3 mg total) by mouth every evening. (Patient  taking differently: Take 3 mg by mouth every evening. Except Sunday and Wednesday when patient takes 2mg ) 30 tablet 1   No current facility-administered medications for this visit.    Review of Systems:  GENERAL:  Chronic fatigue.  No fevers, sweats or weight loss. PERFORMANCE STATUS (ECOG):  3 HEENT:  No visual changes, runny nose, sore throat, mouth sores or tenderness. Lungs:  Mild chronic shortness of breath.  No cough.  No hemoptysis. Cardiac:  No chest pain, palpitations, orthopnea, or PND. GI:  No nausea, vomiting, diarrhea, constipation, melena or hematochezia. GU:  No urgency, frequency, dysuria, or hematuria. Musculoskeletal:  Knee problems ("bone on bone").  No muscle tenderness. Extremities:  Chronic swelling in legs. Skin:  Bruise on  breast.  No rashes or skin changes. Neuro:  No headache, numbness or weakness, balance or coordination issues. Endocrine:  No diabetes, thyroid issues, hot flashes or night sweats. Psych:  No mood changes, depression or anxiety. Pain:  No focal pain. Review of systems:  All other systems reviewed and found to be negative.  Physical Exam: Blood pressure 122/81, pulse 80, temperature 97.1 F (36.2 C), resp. rate 18, height 5\' 5"  (1.651 m). GENERAL:  Elderly woman sitting comfortably in a wheelchair in the exam room in no acute distress. MENTAL STATUS:  Alert and oriented to person, place and time. HEAD:  Pearline Cables hair.  Normocephalic, atraumatic, face symmetric, no Cushingoid features. EYES:  Brown eyes.  Pupils equal round and reactive to light and accomodation.  No conjunctivitis or scleral icterus. ENT:  Oropharynx clear without lesion.  Upper dentures.  Tongue normal. Mucous membranes moist.  RESPIRATORY:  Decreased breath sounds at the bases.  Clear to auscultation without rales, wheezes or rhonchi. CARDIOVASCULAR:  Regular rate and rhythm without murmur, rub or gallop. ABDOMEN:  Left sided colostomy.  Soft, non-tender, with active bowel  sounds, and no appreciable hepatosplenomegaly.  No masses. SKIN:  Lateral breast ecchymosis.  No rashes, ulcers or lesions. EXTREMITIES:  Mild lower extremity edema.  No skin discoloration or tenderness.  No palpable cords. LYMPH NODES: No palpable cervical, supraclavicular, axillary or inguinal adenopathy  NEUROLOGICAL: Unremarkable. PSYCH:  Appropriate.  Appointment on 12/11/2015  Component Date Value Ref Range Status  . WBC 12/11/2015 4.8  3.6 - 11.0 K/uL Final  . RBC 12/11/2015 5.41* 3.80 - 5.20 MIL/uL Final  . Hemoglobin 12/11/2015 13.8  12.0 - 16.0 g/dL Final  . HCT 12/11/2015 42.7  35.0 - 47.0 % Final  . MCV 12/11/2015 78.9* 80.0 - 100.0 fL Final  . MCH 12/11/2015 25.5* 26.0 - 34.0 pg Final  . MCHC 12/11/2015 32.4  32.0 - 36.0 g/dL Final  . RDW 12/11/2015 24.8* 11.5 - 14.5 % Final  . Platelets 12/11/2015 612* 150 - 440 K/uL Final  . Neutrophils Relative % 12/11/2015 69   Final  . Neutro Abs 12/11/2015 3.2  1.4 - 6.5 K/uL Final  . Lymphocytes Relative 12/11/2015 25   Final  . Lymphs Abs 12/11/2015 1.2  1.0 - 3.6 K/uL Final  . Monocytes Relative 12/11/2015 3   Final  . Monocytes Absolute 12/11/2015 0.2  0.2 - 0.9 K/uL Final  . Eosinophils Relative 12/11/2015 2   Final  . Eosinophils Absolute 12/11/2015 0.1  0 - 0.7 K/uL Final  . Basophils Relative 12/11/2015 1   Final  . Basophils Absolute 12/11/2015 0.1  0 - 0.1 K/uL Final  . Prothrombin Time 12/11/2015 22.5* 11.4 - 15.0 seconds Final  . INR 12/11/2015 1.99   Final    Assessment:  Yolanda Wells is a 82 y.o. female African-American woman with JAK2+ polycythemia rubra vera (PV) previously on a phlebotomy program and hydroxyurea. She received P32 in an attempt to manage her counts in 03/2015.   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 She was diagnosed with bilateral lower extremity DVTs on 05/18/2015 and  bilateral pulmonary emboli on 05/24/2015. She underwent IVC filter placement on 05/25/2015.  She is on a fluctuating dose of Coumadin (currently 3 mg a day).  INR was 1.99 today.  She is on a baby aspirin.  She developed progressive erythrocytosis, thrombocytosis, and leukocytosis.  She underwent phlebotomy for a hematocrit of  55.4 on 09/23/2015.  Hematocrit decreased to 50.0, but has again increased after initiation of oral iron.  Platelet count has increased from 1.1 million to 1.4 million.  White count increased from 23,000 - 28,000 to 31,800.  She has been back on hydroxyurea (1000 mg a day) since 10/02/2015.  Initial dose was 1000 mg a day.  She is currently on 1500 mg Mondays, Wednesdays, and Fridays and 1000 mg Tuesday, Thursdays, Saturdays, and Sundays (total weekly dose: 17 pills).    She requires weekly phlebotomies (goal hematocrit 42).  Platelet count initially decreased (1.4 million to 400,000), but has climbed back up to 960,000 possibly secondary to an acute phase reactant (UTI versus iron deficiency).  Goal platelet count is 400,000.  WBC and ANC are adequate.  Symptomatically, she requires assistance with activities of daily living.  Exam reveals no appreciable hepatosplenomegaly.   Plan: 1.  Labs today:  CBC with diff, PT/INR. 2.  Continue phlebotomy program.  Hematocrit goal 42.  Phlebotomy 300 cc.  Discuss increase amount slowly (by 50 cc) based on tolerance. 3.  RTC weekly x 3 for labs (CBC with diff, PT/INR) +/- phlebotomy. 4.  Increase hydroxyurea to 1500 mg Mondays, Wednesdays, Fridays, and Sundays and 1000 mg Tuesday, Thursdays, and Saturdays (total weekly dose: 18 pills).  Goal platelet count 400,000. 5.  Continue baby aspirin. 6.  Discuss consideration of a port-a-cath. 7.  RTC in 1 month for MD assess, labs (CBC with diff, CMP, PT/INR) +/- phlebotomy.   Lequita Asal, MD  12/11/2015

## 2015-12-16 ENCOUNTER — Other Ambulatory Visit: Payer: Self-pay | Admitting: Hematology and Oncology

## 2015-12-16 DIAGNOSIS — D45 Polycythemia vera: Secondary | ICD-10-CM

## 2015-12-18 ENCOUNTER — Inpatient Hospital Stay: Payer: Medicare Other

## 2015-12-18 ENCOUNTER — Inpatient Hospital Stay: Payer: Medicare Other | Attending: Hematology and Oncology

## 2015-12-18 ENCOUNTER — Other Ambulatory Visit: Payer: Self-pay | Admitting: *Deleted

## 2015-12-18 DIAGNOSIS — D45 Polycythemia vera: Secondary | ICD-10-CM

## 2015-12-18 DIAGNOSIS — I82409 Acute embolism and thrombosis of unspecified deep veins of unspecified lower extremity: Secondary | ICD-10-CM

## 2015-12-18 LAB — CBC WITH DIFFERENTIAL/PLATELET
Basophils Absolute: 0 10*3/uL (ref 0–0.1)
Basophils Relative: 1 %
Eosinophils Absolute: 0.1 10*3/uL (ref 0–0.7)
Eosinophils Relative: 3 %
HCT: 43.3 % (ref 35.0–47.0)
Hemoglobin: 13.8 g/dL (ref 12.0–16.0)
Lymphocytes Relative: 31 %
Lymphs Abs: 1.1 10*3/uL (ref 1.0–3.6)
MCH: 25.2 pg — ABNORMAL LOW (ref 26.0–34.0)
MCHC: 31.8 g/dL — ABNORMAL LOW (ref 32.0–36.0)
MCV: 79.4 fL — ABNORMAL LOW (ref 80.0–100.0)
Monocytes Absolute: 0.1 10*3/uL — ABNORMAL LOW (ref 0.2–0.9)
Monocytes Relative: 4 %
Neutro Abs: 2.1 10*3/uL (ref 1.4–6.5)
Neutrophils Relative %: 61 %
Platelets: 525 10*3/uL — ABNORMAL HIGH (ref 150–440)
RBC: 5.45 MIL/uL — ABNORMAL HIGH (ref 3.80–5.20)
RDW: 25 % — ABNORMAL HIGH (ref 11.5–14.5)
WBC: 3.5 10*3/uL — ABNORMAL LOW (ref 3.6–11.0)

## 2015-12-18 LAB — PROTIME-INR
INR: 1.85
Prothrombin Time: 21.3 seconds — ABNORMAL HIGH (ref 11.4–15.0)

## 2015-12-19 ENCOUNTER — Telehealth: Payer: Self-pay

## 2015-12-19 NOTE — Telephone Encounter (Signed)
Called pt and informed pt that her coumadin dose would change to 3mg  daily and we will recheck next week per NP.  Pt verbalized an understanding and asked me to call grandaughter at [phone].  Left message for [name] that her grandmother would start taking 3 mg coumadin daily and we would check again next week. Asked for her to please return my call if she had any questions and to confirm. By the end of typing this note I spoke with [name] and she verbalized an understanding.  No other concerns noted

## 2015-12-25 ENCOUNTER — Inpatient Hospital Stay: Payer: Medicare Other

## 2015-12-25 ENCOUNTER — Other Ambulatory Visit: Payer: Self-pay | Admitting: Hematology and Oncology

## 2015-12-25 DIAGNOSIS — I82409 Acute embolism and thrombosis of unspecified deep veins of unspecified lower extremity: Secondary | ICD-10-CM

## 2015-12-25 DIAGNOSIS — D45 Polycythemia vera: Secondary | ICD-10-CM | POA: Diagnosis not present

## 2015-12-25 LAB — CBC WITH DIFFERENTIAL/PLATELET
Basophils Absolute: 0 10*3/uL (ref 0–0.1)
Basophils Relative: 1 %
Eosinophils Absolute: 0.1 10*3/uL (ref 0–0.7)
Eosinophils Relative: 2 %
HCT: 42.4 % (ref 35.0–47.0)
Hemoglobin: 13.2 g/dL (ref 12.0–16.0)
Lymphocytes Relative: 20 %
Lymphs Abs: 0.9 10*3/uL — ABNORMAL LOW (ref 1.0–3.6)
MCH: 25.4 pg — ABNORMAL LOW (ref 26.0–34.0)
MCHC: 31.1 g/dL — ABNORMAL LOW (ref 32.0–36.0)
MCV: 81.7 fL (ref 80.0–100.0)
Monocytes Absolute: 0.1 10*3/uL — ABNORMAL LOW (ref 0.2–0.9)
Monocytes Relative: 3 %
Neutro Abs: 3.2 10*3/uL (ref 1.4–6.5)
Neutrophils Relative %: 74 %
Platelets: 435 10*3/uL (ref 150–440)
RBC: 5.2 MIL/uL (ref 3.80–5.20)
RDW: 24.7 % — ABNORMAL HIGH (ref 11.5–14.5)
WBC: 4.3 10*3/uL (ref 3.6–11.0)

## 2015-12-25 LAB — PROTIME-INR
INR: 1.79
Prothrombin Time: 20.8 seconds — ABNORMAL HIGH (ref 11.4–15.0)

## 2015-12-28 ENCOUNTER — Telehealth: Payer: Self-pay | Admitting: Hematology and Oncology

## 2015-12-28 NOTE — Telephone Encounter (Signed)
Re:  INR  Discussed with patient's grand-daughter INR of 1.85 on 12/18/2015 and 1.79 on 12/25/2015.    Patient on Coumadin 3 mg a day (21 mg weekly dose) for at least 1 week.    Discussed increase dose to 3 mg 5 days a week and 4 mg 2 days a week (spread out) for a total weekly dose of 23 mg a week.    Repeat INR in 1 week.  Lequita Asal, MD

## 2016-01-01 ENCOUNTER — Inpatient Hospital Stay: Payer: Medicare Other

## 2016-01-01 ENCOUNTER — Other Ambulatory Visit: Payer: Self-pay | Admitting: Hematology and Oncology

## 2016-01-04 ENCOUNTER — Telehealth: Payer: Self-pay

## 2016-01-04 ENCOUNTER — Inpatient Hospital Stay: Payer: Medicare Other

## 2016-01-04 ENCOUNTER — Other Ambulatory Visit: Payer: Self-pay | Admitting: Hematology and Oncology

## 2016-01-04 DIAGNOSIS — I82409 Acute embolism and thrombosis of unspecified deep veins of unspecified lower extremity: Secondary | ICD-10-CM

## 2016-01-04 DIAGNOSIS — D45 Polycythemia vera: Secondary | ICD-10-CM | POA: Diagnosis not present

## 2016-01-04 LAB — CBC WITH DIFFERENTIAL/PLATELET
Basophils Absolute: 0 10*3/uL (ref 0–0.1)
Basophils Relative: 1 %
Eosinophils Absolute: 0.1 10*3/uL (ref 0–0.7)
Eosinophils Relative: 3 %
HCT: 38.6 % (ref 35.0–47.0)
Hemoglobin: 12.5 g/dL (ref 12.0–16.0)
Lymphocytes Relative: 24 %
Lymphs Abs: 0.8 10*3/uL — ABNORMAL LOW (ref 1.0–3.6)
MCH: 25.8 pg — ABNORMAL LOW (ref 26.0–34.0)
MCHC: 32.3 g/dL (ref 32.0–36.0)
MCV: 79.8 fL — ABNORMAL LOW (ref 80.0–100.0)
Monocytes Absolute: 0.1 10*3/uL — ABNORMAL LOW (ref 0.2–0.9)
Monocytes Relative: 3 %
Neutro Abs: 2.5 10*3/uL (ref 1.4–6.5)
Neutrophils Relative %: 69 %
Platelets: 690 10*3/uL — ABNORMAL HIGH (ref 150–440)
RBC: 4.84 MIL/uL (ref 3.80–5.20)
RDW: 24.9 % — ABNORMAL HIGH (ref 11.5–14.5)
WBC: 3.6 10*3/uL (ref 3.6–11.0)

## 2016-01-04 LAB — PROTIME-INR
INR: 1.81
Prothrombin Time: 20.9 seconds — ABNORMAL HIGH (ref 11.4–15.0)

## 2016-01-04 NOTE — Telephone Encounter (Signed)
Called pt's grandaughter about coumadin dosage per pt and MD.  Per Granddaughter states pt takes 3 mg Coumadin on Wed and Sat all other days pt takes 4mg .  No other concerns noted [place] daughter # [phone]

## 2016-01-06 ENCOUNTER — Telehealth: Payer: Self-pay | Admitting: Hematology and Oncology

## 2016-01-06 NOTE — Telephone Encounter (Signed)
Re:  PT/INR  Called regarding INR of 1.81.  Currently taking Coumadin 3 mg 5 days a week and 4 mg 2 days a week (total weekly dose 23 mg).  New dose:  Coumadin 4 mg 4 days a week and 3 mg 3 days a week spread out (total weekly dose 25 mg).  Check INR in 1 week.  Lequita Asal, MD

## 2016-01-07 ENCOUNTER — Other Ambulatory Visit: Payer: Self-pay

## 2016-01-07 ENCOUNTER — Telehealth: Payer: Self-pay | Admitting: *Deleted

## 2016-01-07 MED ORDER — HYDROXYUREA 500 MG PO CAPS: 1000.0000 mg | ORAL_CAPSULE | Freq: Every day | ORAL | Status: DC

## 2016-01-07 NOTE — Telephone Encounter (Signed)
Sent partial Rx to [facility] and faxed 90 day supply to [facility].

## 2016-01-07 NOTE — Telephone Encounter (Signed)
Needs partial rx sent to [facility] and [facility] day rx [facility] to refill er Hydrea last ordered by Dr Norville Haggard at [facility]

## 2016-01-08 ENCOUNTER — Inpatient Hospital Stay: Payer: Medicare Other

## 2016-01-08 ENCOUNTER — Other Ambulatory Visit: Payer: Self-pay | Admitting: Hematology and Oncology

## 2016-01-08 ENCOUNTER — Inpatient Hospital Stay: Payer: Medicare Other | Admitting: Hematology and Oncology

## 2016-01-11 ENCOUNTER — Inpatient Hospital Stay: Payer: Medicare Other | Attending: Hematology and Oncology

## 2016-01-11 ENCOUNTER — Inpatient Hospital Stay: Payer: Medicare Other

## 2016-01-11 ENCOUNTER — Other Ambulatory Visit: Payer: Medicare Other

## 2016-01-11 ENCOUNTER — Telehealth: Payer: Self-pay | Admitting: Hematology and Oncology

## 2016-01-11 ENCOUNTER — Inpatient Hospital Stay (HOSPITAL_BASED_OUTPATIENT_CLINIC_OR_DEPARTMENT_OTHER): Payer: Medicare Other | Admitting: Hematology and Oncology

## 2016-01-11 ENCOUNTER — Encounter: Payer: Self-pay | Admitting: Hematology and Oncology

## 2016-01-11 VITALS — BP 144/85 | HR 73 | Temp 96.2°F | Ht 65.0 in

## 2016-01-11 DIAGNOSIS — I10 Essential (primary) hypertension: Secondary | ICD-10-CM | POA: Diagnosis not present

## 2016-01-11 DIAGNOSIS — M1990 Unspecified osteoarthritis, unspecified site: Secondary | ICD-10-CM

## 2016-01-11 DIAGNOSIS — Z7901 Long term (current) use of anticoagulants: Secondary | ICD-10-CM

## 2016-01-11 DIAGNOSIS — Z7982 Long term (current) use of aspirin: Secondary | ICD-10-CM | POA: Diagnosis not present

## 2016-01-11 DIAGNOSIS — Z90710 Acquired absence of both cervix and uterus: Secondary | ICD-10-CM | POA: Diagnosis not present

## 2016-01-11 DIAGNOSIS — Z8673 Personal history of transient ischemic attack (TIA), and cerebral infarction without residual deficits: Secondary | ICD-10-CM | POA: Diagnosis not present

## 2016-01-11 DIAGNOSIS — Z79899 Other long term (current) drug therapy: Secondary | ICD-10-CM

## 2016-01-11 DIAGNOSIS — D45 Polycythemia vera: Secondary | ICD-10-CM

## 2016-01-11 DIAGNOSIS — D72829 Elevated white blood cell count, unspecified: Secondary | ICD-10-CM | POA: Diagnosis not present

## 2016-01-11 DIAGNOSIS — I82409 Acute embolism and thrombosis of unspecified deep veins of unspecified lower extremity: Secondary | ICD-10-CM

## 2016-01-11 LAB — CBC WITH DIFFERENTIAL/PLATELET
Basophils Absolute: 0 10*3/uL (ref 0–0.1)
Basophils Relative: 1 %
Eosinophils Absolute: 0.1 10*3/uL (ref 0–0.7)
Eosinophils Relative: 2 %
HCT: 39.9 % (ref 35.0–47.0)
Hemoglobin: 12.7 g/dL (ref 12.0–16.0)
Lymphocytes Relative: 31 %
Lymphs Abs: 1 10*3/uL (ref 1.0–3.6)
MCH: 25.5 pg — ABNORMAL LOW (ref 26.0–34.0)
MCHC: 31.9 g/dL — ABNORMAL LOW (ref 32.0–36.0)
MCV: 79.9 fL — ABNORMAL LOW (ref 80.0–100.0)
Monocytes Absolute: 0.1 10*3/uL — ABNORMAL LOW (ref 0.2–0.9)
Monocytes Relative: 4 %
Neutro Abs: 2 10*3/uL (ref 1.4–6.5)
Neutrophils Relative %: 62 %
Platelets: 493 10*3/uL — ABNORMAL HIGH (ref 150–440)
RBC: 4.99 MIL/uL (ref 3.80–5.20)
RDW: 25.3 % — ABNORMAL HIGH (ref 11.5–14.5)
WBC: 3.3 10*3/uL — ABNORMAL LOW (ref 3.6–11.0)

## 2016-01-11 LAB — PROTIME-INR
INR: 1.55
Prothrombin Time: 18.6 seconds — ABNORMAL HIGH (ref 11.4–15.0)

## 2016-01-11 NOTE — Progress Notes (Signed)
[facility] day:  01/11/2016  Chief Complaint: Yolanda Wells is a 82 y.o. female with polycythemia rubra vera (PV) who is seen for 1 month assessment on hydroxyurea and possible phlebotomy.  HPI:   The patient was last seen in the medical oncology clinic on 12/11/2015.  At that time, she required was seen for 1 month assessment on hydroxyurea.  She required assistance with activities of daily living.  Exam revealed no appreciable hepatosplenomegaly.   Labs included a  Hematocrit of 42.7, hemoglobin 13.8, MCV 78.9,  Platelets 612,000, WBC 4800 and ANC 3200.  INR was 1.99.  Hydroxyurea was increased to 1500 mg Mondays, Wednesdays, Fridays, and Sundays and 1000 mg Tuesday, Thursdays, and Saturdays (total weekly dose: 18 pills).   She has had weekly CBCs and small volume phlebotomies (300 cc) if her hematocrit was > 42.  She underwent phlebotomy on 12/18/2015 (hematocrit 43.3) and 12/25/2015 (hematocrit 42.4).  INR was 1.85 on 12/18/2015, 1.79 on 12/25/2015, and 1.81 on 01/04/2016.  Her current Coumadin dose is 4 mg 4 days a week and 3 mg 3 days a week (total weekly dose 25 mg).  Symptomatically, she states that she "feels good every day".  She notes that her nursing and physical therapy stops today as she has met her goals.  She denies any bruising or bleeding.  She has had no infections.   Past Medical History  Diagnosis Date  . Hypertension   . Deep venous thrombosis (HCC)     right lower extremity  . History of hysterectomy   . History of bilateral hip replacements   . Polycythemia vera ([place])   . Dependent edema   . Arthritis   . Collagen vascular disease ([place])   . Stroke [facility])     Pt reports she was told she had a stroke in may    Past Surgical History  Procedure Laterality Date  . Toe amputation      right  . Joint replacement      Left and Right Hip  . Abdominal hysterectomy    . Peripheral vascular catheterization N/A 04/02/2015     Procedure: Visceral Angiography;  Surgeon: Algernon Huxley, MD;  Location: [place] [facility];  Service: Cardiovascular;  Laterality: N/A;  . Peripheral vascular catheterization N/A 04/02/2015    Procedure: Visceral Artery Intervention;  Surgeon: Algernon Huxley, MD;  Location: [place] [facility];  Service: Cardiovascular;  Laterality: N/A;  . Colectomy with colostomy creation/hartmann procedure N/A 04/03/2015    Procedure: COLECTOMY WITH COLOSTOMY CREATION/HARTMANN PROCEDURE;  Surgeon: Sherri Rad, MD;  Location: [facility];  Service: General;  Laterality: N/A;  . Cardiac catheterization Right 04/03/2015    Procedure: CENTRAL LINE INSERTION;  Surgeon: Sherri Rad, MD;  Location: [facility];  Service: General;  Laterality: Right;  . Peripheral vascular catheterization N/A 05/25/2015    Procedure: IVC Filter Insertion;  Surgeon: Katha Cabal, MD;  Location: [place] [facility];  Service: Cardiovascular;  Laterality: N/A;    Family History  Problem Relation Age of Onset  . Heart attack Father   . Hypertension    . Arthritis-Osteo    . COPD Father   . Diabetes Brother   . Osteosarcoma Son     Social History:  reports that she has never smoked. She has never used smokeless tobacco. She reports that she does not drink alcohol or use illicit drugs.  She lives alone on [address] at [facility] (independent living).  Her grand-daughter visits regularly [name] [phone]).  The patient is accompanied by her grand-daughter today.  Allergies: No Known Allergies  Current Medications: Current Outpatient Prescriptions  Medication Sig Dispense Refill  . aspirin EC 81 MG tablet Take 1 tablet (81 mg total) by mouth daily. 30 tablet 2  . docusate sodium (COLACE) 100 MG capsule Take 100 mg by mouth 2 (two) times daily as needed for mild constipation.     . folic acid (FOLVITE) 622 MCG tablet Take 400 mcg by mouth daily.    . furosemide (LASIX) 20 MG tablet Take 20 mg by mouth daily as needed for edema.      . hydroxyurea (HYDREA) 500 MG capsule Take 2-3 capsules (1,000-1,500 mg total) by mouth daily. Pt takes two capsules on Tuesday, Thursday, Saturday, and Sunday.   Pt takes three capsules on Monday, Wednesday, and Friday. 34 capsule 0  . ipratropium-albuterol (DUONEB) 0.5-2.5 (3) MG/3ML SOLN Take 3 mLs by nebulization every 4 (four) hours. (Patient taking differently: Take 3 mLs by nebulization every 4 (four) hours as needed (for wheezing/shortness of breath). ) 360 mL 6  . ketorolac (TORADOL) 10 MG tablet Take 1 tablet (10 mg total) by mouth every 6 (six) hours. 20 tablet 0  . menthol-cetylpyridinium (CEPACOL) 3 MG lozenge Take 1 lozenge (3 mg total) by mouth as needed for sore throat. 100 tablet 12  . metoprolol tartrate (LOPRESSOR) 25 MG tablet Take 1 tablet (25 mg total) by mouth 2 (two) times daily. 60 tablet 6  . mupirocin ointment (BACTROBAN) 2 % Place 1 application into the nose 2 (two) times daily. 22 g 0  . ondansetron (ZOFRAN) 4 MG tablet Take 4 mg by mouth every 6 (six) hours as needed for nausea or vomiting.    . ondansetron (ZOFRAN-ODT) 4 MG disintegrating tablet Take 1 tablet (4 mg total) by mouth every 8 (eight) hours as needed for nausea or vomiting. 20 tablet 0  . oxyCODONE-acetaminophen (PERCOCET/ROXICET) 5-325 MG tablet Take 1 tablet by mouth every 6 (six) hours as needed for severe pain. 20 tablet 0  . pantoprazole (PROTONIX) 40 MG tablet Take 1 tablet (40 mg total) by mouth daily. 30 tablet 0  . Polyethylene Glycol POWD Take 17 g by mouth daily. 527 g 5  . potassium chloride SA (K-DUR,KLOR-CON) 20 MEQ tablet Take 1 tablet (20 mEq total) by mouth 2 (two) times daily. 30 tablet 0  . warfarin (COUMADIN) 3 MG tablet Take 3 mg by mouth daily. 3 days a week    . warfarin (COUMADIN) 4 MG tablet Take 4 mg by mouth daily. 4 days a week     No current facility-administered medications for this visit.    Review of Systems:  GENERAL:  Feels good.  No fevers, sweats or weight  loss. PERFORMANCE STATUS (ECOG):  3 HEENT:  No visual changes, runny nose, sore throat, mouth sores or tenderness. Lungs:  No shortness of breath or cough.  No hemoptysis. Cardiac:  No chest pain, palpitations, orthopnea, or PND. GI:  No nausea, vomiting, diarrhea, constipation, melena or hematochezia. GU:  No urgency, frequency, dysuria, or hematuria. Musculoskeletal:  Knee problems ("bone on bone").  No muscle tenderness. Extremities:  Chronic swelling in legs. Skin:  No rashes or skin changes. Neuro:  No headache, numbness or weakness, balance or coordination issues. Endocrine:  No diabetes, thyroid issues, hot flashes or night sweats. Psych:  No mood changes, depression or anxiety. Pain:  No focal pain. Review of systems:  All  other systems reviewed and found to be negative.  Physical Exam: Blood pressure 144/85, pulse 73, temperature 96.2 F (35.7 C), temperature source Tympanic, height _0  (1.651 m). GENERAL:  Elderly woman sitting comfortably in a wheelchair in the exam room in no acute distress. MENTAL STATUS:  Alert and oriented to person, place and time. HEAD:  Wearing a maroon hat and scarf.  Gray hair.  Normocephalic, atraumatic, face symmetric, no Cushingoid features. EYES:  Brown eyes.  Pupils equal round and reactive to light and accomodation.  No conjunctivitis or scleral icterus. ENT:  Oropharynx clear without lesion.  Upper dentures.  Tongue normal. Mucous membranes moist.  RESPIRATORY:  Decreased breath sounds at the bases.  Clear to auscultation without rales, wheezes or rhonchi. CARDIOVASCULAR:  Regular rate and rhythm without murmur, rub or gallop. ABDOMEN:  Left sided colostomy.  Soft, non-tender, with active bowel sounds, and no appreciable hepatosplenomegaly.  No masses. SKIN:  No rashes, ulcers or lesions. EXTREMITIES:  No edema, skin discoloration or tenderness.  No palpable cords. LYMPH NODES: No palpable cervical, supraclavicular, axillary or inguinal  adenopathy  NEUROLOGICAL: Unremarkable. PSYCH:  Appropriate.  Appointment on 01/11/2016  Component Date Value Ref Range Status  . WBC 01/11/2016 3.3* 3.6 - 11.0 K/uL Final  . RBC 01/11/2016 4.99  3.80 - 5.20 MIL/uL Final  . Hemoglobin 01/11/2016 12.7  12.0 - 16.0 g/dL Final  . HCT 01/11/2016 39.9  35.0 - 47.0 % Final  . MCV 01/11/2016 79.9* 80.0 - 100.0 fL Final  . MCH 01/11/2016 25.5* 26.0 - 34.0 pg Final  . MCHC 01/11/2016 31.9* 32.0 - 36.0 g/dL Final  . RDW 01/11/2016 25.3* 11.5 - 14.5 % Final  . Platelets 01/11/2016 493* 150 - 440 K/uL Final  . Neutrophils Relative % 01/11/2016 62   Final  . Neutro Abs 01/11/2016 2.0  1.4 - 6.5 K/uL Final  . Lymphocytes Relative 01/11/2016 31   Final  . Lymphs Abs 01/11/2016 1.0  1.0 - 3.6 K/uL Final  . Monocytes Relative 01/11/2016 4   Final  . Monocytes Absolute 01/11/2016 0.1* 0.2 - 0.9 K/uL Final  . Eosinophils Relative 01/11/2016 2   Final  . Eosinophils Absolute 01/11/2016 0.1  0 - 0.7 K/uL Final  . Basophils Relative 01/11/2016 1   Final  . Basophils Absolute 01/11/2016 0.0  0 - 0.1 K/uL Final    Assessment:  Yolanda Wells is a 82 y.o. female African-American woman with JAK2+ polycythemia rubra vera (PV) previously on a phlebotomy program and hydroxyurea. She received P32 in an attempt to manage her counts in 03/2015.   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 She was diagnosed with bilateral lower extremity DVTs on 05/18/2015 and bilateral pulmonary emboli on 05/24/2015. She underwent IVC filter placement on 05/25/2015.  She is on a fluctuating dose of Coumadin (total weekly dose 25 mg).  She is on a baby aspirin.  She developed progressive erythrocytosis, thrombocytosis, and leukocytosis.  She underwent phlebotomy for a hematocrit of 55.4 on 09/23/2015.  Hematocrit decreased to 50.0, but has again increased after  initiation of oral iron.  Platelet count has increased from 1.1 million to 1.4 million.  White count increased from 23,000 - 28,000 to 31,800.  She has been back on hydroxyurea (1000 mg a day) since 10/02/2015.  Initial dose was 1000 mg a day.  She is currently taking 1500 mg Mondays, Wednesdays, Fridays, and Sundays and 1000 mg  Tuesday, Thursdays, and Saturdays (total weekly dose: 18 pills).   She requires weekly phlebotomies (goal hematocrit 42).  Platelet count initially decreased (1.4 million to 493,000).  Goal platelet count is 400,000.  WBC and ANC are adequate.  Symptomatically, she feels good.  Exam reveals no appreciable hepatosplenomegaly.  Hematocrit and WBC are adequate.  Platelet count is improving.  INR is pending.  Plan: 1.  Labs today:  CBC with diff, PT/INR. 2.  No phlebotomy today.  Hematocrit goal 42.  3.  Continue current dose of hydroxyurea. 4.  Continue baby aspirin. 5.  Call grand-daughter with INR and any adjustment in Coumadin. 6.  Anticipate decreasing frequency of visits if INR stable and does not require phlebotomy. 7.  RTC weekly x 2 for labs (CBC with diff, PT/INR) +/- phlebotomy. 8.  RTC in 1 month for MD assess, labs (CBC with diff, CMP, ferritin, PT/INR) +/- phlebotomy.   Lequita Asal, MD  01/11/2016, 9:14 AM

## 2016-01-11 NOTE — Telephone Encounter (Signed)
Re:  PT/INR  Discussed with patient's grand-daughter decreased INR to 1.55 despite last increase of Coumadin.  Prior Coumadin dosing: 4 mg 4 days/week and 3 mg 3 days/week (25 mg total weekly dose)  Grand-daughter notes giving her 4 mg on Sunday, Tuesday, and Thursday  New Coumadin dosing: 4 mg 5 days/week and 3 mg 2 days/week (26 mg total weekly dose)  Repeat INR in 1 week.  Lequita Asal, MD

## 2016-01-18 ENCOUNTER — Other Ambulatory Visit: Payer: Self-pay | Admitting: Hematology and Oncology

## 2016-01-18 ENCOUNTER — Other Ambulatory Visit: Payer: Medicare Other

## 2016-01-18 ENCOUNTER — Inpatient Hospital Stay: Payer: Medicare Other

## 2016-01-18 DIAGNOSIS — D45 Polycythemia vera: Secondary | ICD-10-CM

## 2016-01-18 DIAGNOSIS — I82409 Acute embolism and thrombosis of unspecified deep veins of unspecified lower extremity: Secondary | ICD-10-CM

## 2016-01-18 LAB — CBC WITH DIFFERENTIAL/PLATELET
Basophils Absolute: 0 10*3/uL (ref 0–0.1)
Basophils Relative: 1 %
Eosinophils Absolute: 0.1 10*3/uL (ref 0–0.7)
Eosinophils Relative: 2 %
HCT: 40.4 % (ref 35.0–47.0)
Hemoglobin: 12.9 g/dL (ref 12.0–16.0)
Lymphocytes Relative: 20 %
Lymphs Abs: 1.2 10*3/uL (ref 1.0–3.6)
MCH: 25.5 pg — ABNORMAL LOW (ref 26.0–34.0)
MCHC: 32 g/dL (ref 32.0–36.0)
MCV: 79.7 fL — ABNORMAL LOW (ref 80.0–100.0)
Monocytes Absolute: 0.2 10*3/uL (ref 0.2–0.9)
Monocytes Relative: 3 %
Neutro Abs: 4.3 10*3/uL (ref 1.4–6.5)
Neutrophils Relative %: 74 %
Platelets: 618 10*3/uL — ABNORMAL HIGH (ref 150–440)
RBC: 5.07 MIL/uL (ref 3.80–5.20)
RDW: 23.2 % — ABNORMAL HIGH (ref 11.5–14.5)
WBC: 5.8 10*3/uL (ref 3.6–11.0)

## 2016-01-18 LAB — PROTIME-INR
INR: 1.67
Prothrombin Time: 19.7 seconds — ABNORMAL HIGH (ref 11.4–15.0)

## 2016-01-19 ENCOUNTER — Other Ambulatory Visit: Payer: Self-pay

## 2016-01-19 DIAGNOSIS — D45 Polycythemia vera: Secondary | ICD-10-CM

## 2016-01-21 ENCOUNTER — Telehealth: Payer: Self-pay | Admitting: Hematology and Oncology

## 2016-01-21 DIAGNOSIS — I2699 Other pulmonary embolism without acute cor pulmonale: Secondary | ICD-10-CM

## 2016-01-21 MED ORDER — WARFARIN SODIUM 1 MG PO TABS: ORAL_TABLET | ORAL | Status: DC

## 2016-01-21 MED ORDER — WARFARIN SODIUM 4 MG PO TABS: 4.0000 mg | ORAL_TABLET | Freq: Every day | ORAL | Status: DC

## 2016-01-21 NOTE — Telephone Encounter (Signed)
Re:  PT/INR  Called [name] Green [phone]) regarding patient's persistent low INR on Coumadin.  She is eating well.  She is trying to avoid green leafy vegetables.  She has been complaint with all of her doses.  Per last note on 01/11/2016, Current Coumadin dosing: 4 mg 5 days/week and 3 mg 2 days/week (26 mg total weekly dose)  Current INR 1.67.  Plan to increase dose by 10%. New Coumadin dose:  5 mg tonight only then 4 mg a day (29 mg total for this week's dose).  Check INR on 01/25/2016.  If INR increasing, continue 4 mg a day with recheck next week.  Lequita Asal, MD

## 2016-01-29 ENCOUNTER — Telehealth: Payer: Self-pay | Admitting: *Deleted

## 2016-01-29 MED ORDER — WARFARIN SODIUM 4 MG PO TABS: 4.0000 mg | ORAL_TABLET | Freq: Every day | ORAL | Status: DC

## 2016-01-29 MED ORDER — WARFARIN SODIUM 1 MG PO TABS: ORAL_TABLET | ORAL | Status: DC

## 2016-01-29 NOTE — Telephone Encounter (Signed)
Yolanda Wells called to state that warfarin 1mg  and 4mg  tablets have not been received at the [facility]. Prescription for warfarin [facility] on [facility]. Yolanda Wells requested that prescription be sent to [place]. Informed Yolanda Wells that will escribe prescription to [name] and cancel prescription at [place].

## 2016-02-01 ENCOUNTER — Inpatient Hospital Stay: Payer: Medicare Other

## 2016-02-06 ENCOUNTER — Telehealth: Payer: Self-pay

## 2016-02-06 NOTE — Telephone Encounter (Signed)
Called and checked on pt.  Per Judeen Hammans Dr. Mike Gip could not reach pt.  I reached pt on home phone. Pt answered and verbalized that she has an appt on the 31st for lab.  I verbalized I just wanted to make sure she could make it and she stated yes she would be in.  No others concerns noted.

## 2016-02-08 ENCOUNTER — Inpatient Hospital Stay: Payer: Medicare Other

## 2016-02-08 ENCOUNTER — Other Ambulatory Visit: Payer: Self-pay | Admitting: Hematology and Oncology

## 2016-02-08 ENCOUNTER — Inpatient Hospital Stay (HOSPITAL_BASED_OUTPATIENT_CLINIC_OR_DEPARTMENT_OTHER): Payer: Medicare Other | Admitting: Hematology and Oncology

## 2016-02-08 VITALS — BP 123/88 | HR 73 | Temp 95.3°F | Resp 18 | Ht 65.0 in | Wt 187.0 lb

## 2016-02-08 DIAGNOSIS — D45 Polycythemia vera: Secondary | ICD-10-CM

## 2016-02-08 DIAGNOSIS — M1990 Unspecified osteoarthritis, unspecified site: Secondary | ICD-10-CM | POA: Diagnosis not present

## 2016-02-08 DIAGNOSIS — Z7982 Long term (current) use of aspirin: Secondary | ICD-10-CM

## 2016-02-08 DIAGNOSIS — Z7901 Long term (current) use of anticoagulants: Secondary | ICD-10-CM

## 2016-02-08 DIAGNOSIS — D72829 Elevated white blood cell count, unspecified: Secondary | ICD-10-CM | POA: Diagnosis not present

## 2016-02-08 DIAGNOSIS — I10 Essential (primary) hypertension: Secondary | ICD-10-CM | POA: Diagnosis not present

## 2016-02-08 DIAGNOSIS — Z79899 Other long term (current) drug therapy: Secondary | ICD-10-CM

## 2016-02-08 DIAGNOSIS — I82409 Acute embolism and thrombosis of unspecified deep veins of unspecified lower extremity: Secondary | ICD-10-CM

## 2016-02-08 DIAGNOSIS — Z8673 Personal history of transient ischemic attack (TIA), and cerebral infarction without residual deficits: Secondary | ICD-10-CM

## 2016-02-08 LAB — CBC WITH DIFFERENTIAL/PLATELET
Basophils Absolute: 0.1 10*3/uL (ref 0–0.1)
Basophils Relative: 1 %
Eosinophils Absolute: 0.2 10*3/uL (ref 0–0.7)
Eosinophils Relative: 3 %
HCT: 39.7 % (ref 35.0–47.0)
Hemoglobin: 12.6 g/dL (ref 12.0–16.0)
Lymphocytes Relative: 16 %
Lymphs Abs: 1 10*3/uL (ref 1.0–3.6)
MCH: 25 pg — ABNORMAL LOW (ref 26.0–34.0)
MCHC: 31.6 g/dL — ABNORMAL LOW (ref 32.0–36.0)
MCV: 79.1 fL — ABNORMAL LOW (ref 80.0–100.0)
Monocytes Absolute: 0.1 10*3/uL — ABNORMAL LOW (ref 0.2–0.9)
Monocytes Relative: 2 %
Neutro Abs: 4.9 10*3/uL (ref 1.4–6.5)
Neutrophils Relative %: 78 %
Platelets: 887 10*3/uL — ABNORMAL HIGH (ref 150–440)
RBC: 5.03 MIL/uL (ref 3.80–5.20)
RDW: 22.5 % — ABNORMAL HIGH (ref 11.5–14.5)
WBC: 6.3 10*3/uL (ref 3.6–11.0)

## 2016-02-08 LAB — COMPREHENSIVE METABOLIC PANEL
ALT: 10 U/L — ABNORMAL LOW (ref 14–54)
AST: 19 U/L (ref 15–41)
Albumin: 3.1 g/dL — ABNORMAL LOW (ref 3.5–5.0)
Alkaline Phosphatase: 77 U/L (ref 38–126)
Anion gap: 6 (ref 5–15)
BUN: 11 mg/dL (ref 6–20)
CO2: 25 mmol/L (ref 22–32)
Calcium: 8.4 mg/dL — ABNORMAL LOW (ref 8.9–10.3)
Chloride: 105 mmol/L (ref 101–111)
Creatinine, Ser: 0.49 mg/dL (ref 0.44–1.00)
GFR calc Af Amer: >60 mL/min (ref >60)
GFR calc non Af Amer: >60 mL/min (ref >60)
Glucose, Bld: 110 mg/dL — ABNORMAL HIGH (ref 65–99)
Potassium: 4.2 mmol/L (ref 3.5–5.1)
Sodium: 136 mmol/L (ref 135–145)
Total Bilirubin: 0.6 mg/dL (ref 0.3–1.2)
Total Protein: 7.3 g/dL (ref 6.5–8.1)

## 2016-02-08 LAB — PROTIME-INR
INR: 1.69
Prothrombin Time: 19.9 seconds — ABNORMAL HIGH (ref 11.4–15.0)

## 2016-02-08 LAB — FERRITIN: Ferritin: 9 ng/mL — ABNORMAL LOW (ref 11–307)

## 2016-02-08 NOTE — Progress Notes (Signed)
[facility] day:  02/08/2016  Chief Complaint: Yolanda Wells is a 82 y.o. female with polycythemia rubra vera (PV) who is seen for 1 month assessment on hydroxyurea and possible phlebotomy.  HPI:   The patient was last seen in the medical oncology clinic on 01/11/2016.  At that time, she was feeling good.  Exam was stable.  CBC revealed a hematocrit of 39.9, hemoglobin 12.7, MCV 79.9, platelets 493,000, WBC 3300 with an ANC of 2000.  INR was 1.55. She was on Coumadin 25 mg a week.  At last visit, she was on hydroxyurea 1500 mg Mondays, Wednesdays, Fridays, and Sundays and 1000 mg Tuesday, Thursdays, and Saturdays (total weekly dose: 18 pills). Her dose was not adjusted.  She was on Coumadin 25 mg a week.  She states that she is taking hydrea 1500 mg MWF and 1000 mg TThSaSu (17 pills a week).  She is taking Coumadin 4 mg a day (total weekly dose 28 mg).  INR was 1.67 on 01/18/2016.  Ferritin was 9 (low).  She has not required interval phlebotomies.  Symptomatically, she notes no new complaints.  She denies any bruising or bleeding.  She has had no infections.   Past Medical History  Diagnosis Date  . Hypertension   . Deep venous thrombosis (HCC)     right lower extremity  . History of hysterectomy   . History of bilateral hip replacements   . Polycythemia vera ([place])   . Dependent edema   . Arthritis   . Collagen vascular disease ([place])   . Stroke [facility])     Pt reports she was told she had a stroke in may    Past Surgical History  Procedure Laterality Date  . Toe amputation      right  . Joint replacement      Left and Right Hip  . Abdominal hysterectomy    . Peripheral vascular catheterization N/A 04/02/2015    Procedure: Visceral Angiography;  Surgeon: Algernon Huxley, MD;  Location: [place] [facility];  Service: Cardiovascular;  Laterality: N/A;  . Peripheral vascular catheterization N/A 04/02/2015    Procedure: Visceral Artery  Intervention;  Surgeon: Algernon Huxley, MD;  Location: [place] [facility];  Service: Cardiovascular;  Laterality: N/A;  . Colectomy with colostomy creation/hartmann procedure N/A 04/03/2015    Procedure: COLECTOMY WITH COLOSTOMY CREATION/HARTMANN PROCEDURE;  Surgeon: Sherri Rad, MD;  Location: [facility];  Service: General;  Laterality: N/A;  . Cardiac catheterization Right 04/03/2015    Procedure: CENTRAL LINE INSERTION;  Surgeon: Sherri Rad, MD;  Location: [facility] OR[facility];  Service: General;  Laterality: Right;  . Peripheral vascular catheterization N/A 05/25/2015    Procedure: IVC Filter Insertion;  Surgeon: Katha Cabal, MD;  Location: [place] [facility];  Service: Cardiovascular;  Laterality: N/A;    Family History  Problem Relation Age of Onset  . Heart attack Father   . Hypertension    . Arthritis-Osteo    . COPD Father   . Diabetes Brother   . Osteosarcoma Son     Social History:  reports that she has never smoked. She has never used smokeless tobacco. She reports that she does not drink alcohol or use illicit drugs.  She lives alone on [address] at [place] (independent living). Her grand-daughter visits regularly [name] [phone]).  The patient is accompanied by her grand-daughter today.  Allergies: No Known Allergies  Current Medications: Current Outpatient Prescriptions  Medication Sig  Dispense Refill  . aspirin EC 81 MG tablet Take 1 tablet (81 mg total) by mouth daily. 30 tablet 2  . docusate sodium (COLACE) 100 MG capsule Take 100 mg by mouth 2 (two) times daily as needed for mild constipation.     . folic acid (FOLVITE) 683 MCG tablet Take 400 mcg by mouth daily.    . hydroxyurea (HYDREA) 500 MG capsule Take 2-3 capsules (1,000-1,500 mg total) by mouth daily. Pt takes two capsules on Tuesday, Thursday, Saturday, and Sunday.   Pt takes three capsules on Monday, Wednesday, and Friday. 34 capsule 0  . ipratropium-albuterol (DUONEB) 0.5-2.5 (3) MG/3ML SOLN Take 3 mLs  by nebulization every 4 (four) hours. (Patient taking differently: Take 3 mLs by nebulization every 4 (four) hours as needed (for wheezing/shortness of breath). ) 360 mL 6  . ketorolac (TORADOL) 10 MG tablet Take 1 tablet (10 mg total) by mouth every 6 (six) hours. 20 tablet 0  . menthol-cetylpyridinium (CEPACOL) 3 MG lozenge Take 1 lozenge (3 mg total) by mouth as needed for sore throat. 100 tablet 12  . ondansetron (ZOFRAN-ODT) 4 MG disintegrating tablet Take 1 tablet (4 mg total) by mouth every 8 (eight) hours as needed for nausea or vomiting. 20 tablet 0  . oxyCODONE-acetaminophen (PERCOCET/ROXICET) 5-325 MG tablet Take 1 tablet by mouth every 6 (six) hours as needed for severe pain. 20 tablet 0  . pantoprazole (PROTONIX) 40 MG tablet Take 1 tablet (40 mg total) by mouth daily. 30 tablet 0  . Polyethylene Glycol POWD Take 17 g by mouth daily. 527 g 5  . potassium chloride SA (K-DUR,KLOR-CON) 20 MEQ tablet Take 1 tablet (20 mEq total) by mouth 2 (two) times daily. 30 tablet 0  . warfarin (COUMADIN) 4 MG tablet Take 1 tablet (4 mg total) by mouth daily. 30 tablet 1  . furosemide (LASIX) 20 MG tablet Take 20 mg by mouth daily as needed for edema. Reported on 02/08/2016     No current facility-administered medications for this visit.    Review of Systems:  GENERAL:  Feels "about the same".  No fevers, sweats or weight loss. PERFORMANCE STATUS (ECOG):  3 HEENT:  No visual changes, runny nose, sore throat, mouth sores or tenderness. Lungs:  No shortness of breath or cough.  No hemoptysis. Cardiac:  No chest pain, palpitations, orthopnea, or PND. GI:  No nausea, vomiting, diarrhea, constipation, melena or hematochezia. GU:  No urgency, frequency, dysuria, or hematuria. Musculoskeletal:  Knee problems ("bone on bone").  No muscle tenderness. Extremities:  Chronic swelling in legs. Skin:  No rashes or skin changes. Neuro:  No headache, numbness or weakness, balance or coordination  issues. Endocrine:  No diabetes, thyroid issues, hot flashes or night sweats. Psych:  No mood changes, depression or anxiety. Pain:  No focal pain. Review of systems:  All other systems reviewed and found to be negative.  Physical Exam: Blood pressure 123/88, pulse 73, temperature 95.3 F (35.2 C), temperature source Tympanic, resp. rate 18, height '5\' 5"'$  (1.651 m), weight 187 lb (84.823 kg), SpO2 100 %. GENERAL:  Elderly woman sitting comfortably in a wheelchair in the exam room in no acute distress. MENTAL STATUS:  Alert and oriented to person, place and time. HEAD:  Yolanda Wells hair.  Normocephalic, atraumatic, face symmetric, no Cushingoid features. EYES:  Brown eyes.  Pupils equal round and reactive to light and accomodation.  No conjunctivitis or scleral icterus. ENT:  Oropharynx clear without lesion.  Upper dentures.  Tongue normal.  Mucous membranes moist.  RESPIRATORY:  Decreased breath sounds at the bases.  Clear to auscultation without rales, wheezes or rhonchi. CARDIOVASCULAR:  Regular rate and rhythm without murmur, rub or gallop. ABDOMEN:  Left sided colostomy.  Soft, non-tender, with active bowel sounds, and no appreciable hepatosplenomegaly.  No masses. SKIN:  No rashes, ulcers or lesions. EXTREMITIES:  No edema, skin discoloration or tenderness.  No palpable cords. LYMPH NODES: No palpable cervical, supraclavicular, axillary or inguinal adenopathy  NEUROLOGICAL: Unremarkable. PSYCH:  Appropriate.  Appointment on 02/08/2016  Component Date Value Ref Range Status  . WBC 02/08/2016 6.3  3.6 - 11.0 K/uL Final  . RBC 02/08/2016 5.03  3.80 - 5.20 MIL/uL Final  . Hemoglobin 02/08/2016 12.6  12.0 - 16.0 g/dL Final  . HCT 02/08/2016 39.7  35.0 - 47.0 % Final  . MCV 02/08/2016 79.1* 80.0 - 100.0 fL Final  . MCH 02/08/2016 25.0* 26.0 - 34.0 pg Final  . MCHC 02/08/2016 31.6* 32.0 - 36.0 g/dL Final  . RDW 02/08/2016 22.5* 11.5 - 14.5 % Final  . Platelets 02/08/2016 887* 150 - 440 K/uL  Final  . Neutrophils Relative % 02/08/2016 78   Final  . Neutro Abs 02/08/2016 4.9  1.4 - 6.5 K/uL Final  . Lymphocytes Relative 02/08/2016 16   Final  . Lymphs Abs 02/08/2016 1.0  1.0 - 3.6 K/uL Final  . Monocytes Relative 02/08/2016 2   Final  . Monocytes Absolute 02/08/2016 0.1* 0.2 - 0.9 K/uL Final  . Eosinophils Relative 02/08/2016 3   Final  . Eosinophils Absolute 02/08/2016 0.2  0 - 0.7 K/uL Final  . Basophils Relative 02/08/2016 1   Final  . Basophils Absolute 02/08/2016 0.1  0 - 0.1 K/uL Final  . Sodium 02/08/2016 136  135 - 145 mmol/L Final  . Potassium 02/08/2016 4.2  3.5 - 5.1 mmol/L Final  . Chloride 02/08/2016 105  101 - 111 mmol/L Final  . CO2 02/08/2016 25  22 - 32 mmol/L Final  . Glucose, Bld 02/08/2016 110* 65 - 99 mg/dL Final  . BUN 02/08/2016 11  6 - 20 mg/dL Final  . Creatinine, Ser 02/08/2016 0.49  0.44 - 1.00 mg/dL Final  . Calcium 02/08/2016 8.4* 8.9 - 10.3 mg/dL Final  . Total Protein 02/08/2016 7.3  6.5 - 8.1 g/dL Final  . Albumin 02/08/2016 3.1* 3.5 - 5.0 g/dL Final  . AST 02/08/2016 19  15 - 41 U/L Final  . ALT 02/08/2016 10* 14 - 54 U/L Final  . Alkaline Phosphatase 02/08/2016 77  38 - 126 U/L Final  . Total Bilirubin 02/08/2016 0.6  0.3 - 1.2 mg/dL Final  . GFR calc non Af Amer 02/08/2016 >60  >60 mL/min Final  . GFR calc Af Amer 02/08/2016 >60  >60 mL/min Final   Comment: (NOTE) The eGFR has been calculated using the CKD EPI equation. This calculation has not been validated in all clinical situations. eGFR's persistently <60 mL/min signify possible Chronic Kidney Disease.   . Anion gap 02/08/2016 6  5 - 15 Final  . Prothrombin Time 02/08/2016 19.9* 11.4 - 15.0 seconds Final  . INR 02/08/2016 1.69   Final    Assessment:  Yolanda Wells is a 82 y.o. female African-American woman with JAK2+ polycythemia rubra vera (PV) previously on a phlebotomy program and hydroxyurea. She received P32 in an attempt to manage her counts in 03/2015.   Course has  recently been complicated by a cerebellar CVA on 04/01/2015, splenic flexure bleeding requiring micro-embolization then  colectomy on 04/03/2015. She was diagnosed with bilateral lower extremity DVTs on 05/18/2015 and bilateral pulmonary emboli on 05/24/2015. She underwent IVC filter placement on 05/25/2015.  She is on a fluctuating dose of Coumadin (total weekly dose 28 mg).  She is on a baby aspirin.  She developed progressive erythrocytosis, thrombocytosis, and leukocytosis.  She underwent phlebotomy for a hematocrit of 55.4 on 09/23/2015.  Hematocrit decreased to 50.0, but has again increased after initiation of oral iron.  Platelet count has increased from 1.1 million to 1.4 million.  White count increased from 23,000 - 28,000 to 31,800.  She has been back on hydroxyurea (1000 mg a day) since 10/02/2015.  Initial dose was 1000 mg a day.  She is currently taking 1500 mg Mondays, Wednesdays, and Fridays and 1000 mg Tuesday, Thursdays, Saturdays and Sundays (total weekly dose: 17 pills).   She requires weekly phlebotomies (goal hematocrit 42).  Platelet count initially decreased (1.4 million to 493,000).  Goal platelet count is 400,000.  Hematocrit and ANC are adequate.  Platelet count is 887,000.  Ferritin is 9.  Symptomatically, she feels good.  Exam reveals no appreciable hepatosplenomegaly.  INR is 1.69.  Plan: 1.  Labs today:  CBC with diff, CMP, PT/INR. 2.  No phlebotomy today.  Hematocrit goal 42.  3.  Increase hydroxyurea to 1500 mg 6 days/week and 1000 mg 1 day/week (20 pills). 4.  Continue baby aspirin. 5.  Increase Coumadin to 5 mg 3 days/week and 4 mg 4 days/week (total weekly dose: 31 mg). 6.  Anticipate decreasing frequency of visits if INR stable and does not require phlebotomy. 7.  RTC weekly for PT/INR. 8.  RTC in 2 weeks for labs (CBC with diff, PT/INR) and +/- phlebotomy. 9.  RTC in 4 weeks for MD assess, labs (CBC with diff, CMP, PT/INR) +/- phlebotomy.   Lequita Asal, MD  02/08/2016, 11:45 AM

## 2016-02-08 NOTE — Progress Notes (Signed)
No changes since last visit. 

## 2016-02-15 ENCOUNTER — Inpatient Hospital Stay: Payer: Medicare Other | Attending: Hematology and Oncology

## 2016-02-15 DIAGNOSIS — Z8673 Personal history of transient ischemic attack (TIA), and cerebral infarction without residual deficits: Secondary | ICD-10-CM | POA: Insufficient documentation

## 2016-02-15 DIAGNOSIS — Z7901 Long term (current) use of anticoagulants: Secondary | ICD-10-CM | POA: Diagnosis not present

## 2016-02-15 DIAGNOSIS — Z86711 Personal history of pulmonary embolism: Secondary | ICD-10-CM | POA: Insufficient documentation

## 2016-02-15 DIAGNOSIS — D45 Polycythemia vera: Secondary | ICD-10-CM | POA: Diagnosis not present

## 2016-02-15 DIAGNOSIS — Z79899 Other long term (current) drug therapy: Secondary | ICD-10-CM | POA: Diagnosis not present

## 2016-02-15 DIAGNOSIS — Z933 Colostomy status: Secondary | ICD-10-CM | POA: Insufficient documentation

## 2016-02-15 DIAGNOSIS — I10 Essential (primary) hypertension: Secondary | ICD-10-CM | POA: Diagnosis not present

## 2016-02-15 DIAGNOSIS — Z86718 Personal history of other venous thrombosis and embolism: Secondary | ICD-10-CM | POA: Insufficient documentation

## 2016-02-15 DIAGNOSIS — Z7982 Long term (current) use of aspirin: Secondary | ICD-10-CM | POA: Diagnosis not present

## 2016-02-15 DIAGNOSIS — I82409 Acute embolism and thrombosis of unspecified deep veins of unspecified lower extremity: Secondary | ICD-10-CM

## 2016-02-15 DIAGNOSIS — M1990 Unspecified osteoarthritis, unspecified site: Secondary | ICD-10-CM | POA: Insufficient documentation

## 2016-02-15 LAB — CBC WITH DIFFERENTIAL/PLATELET
Basophils Absolute: 0.1 10*3/uL (ref 0–0.1)
Basophils Relative: 1 %
Eosinophils Absolute: 0.2 10*3/uL (ref 0–0.7)
Eosinophils Relative: 3 %
HCT: 41.3 % (ref 35.0–47.0)
Hemoglobin: 13 g/dL (ref 12.0–16.0)
Lymphocytes Relative: 21 %
Lymphs Abs: 1.1 10*3/uL (ref 1.0–3.6)
MCH: 25 pg — ABNORMAL LOW (ref 26.0–34.0)
MCHC: 31.6 g/dL — ABNORMAL LOW (ref 32.0–36.0)
MCV: 79.3 fL — ABNORMAL LOW (ref 80.0–100.0)
Monocytes Absolute: 0.1 10*3/uL — ABNORMAL LOW (ref 0.2–0.9)
Monocytes Relative: 2 %
Neutro Abs: 4.1 10*3/uL (ref 1.4–6.5)
Neutrophils Relative %: 73 %
Platelets: 844 10*3/uL — ABNORMAL HIGH (ref 150–440)
RBC: 5.21 MIL/uL — ABNORMAL HIGH (ref 3.80–5.20)
RDW: 22 % — ABNORMAL HIGH (ref 11.5–14.5)
WBC: 5.5 10*3/uL (ref 3.6–11.0)

## 2016-02-15 LAB — PROTIME-INR
INR: 1.92
Prothrombin Time: 21.9 seconds — ABNORMAL HIGH (ref 11.4–15.0)

## 2016-02-22 ENCOUNTER — Other Ambulatory Visit: Payer: Medicare Other

## 2016-02-22 ENCOUNTER — Inpatient Hospital Stay: Payer: Medicare Other

## 2016-02-22 ENCOUNTER — Telehealth: Payer: Self-pay | Admitting: Hematology and Oncology

## 2016-02-22 ENCOUNTER — Encounter: Payer: Self-pay | Admitting: Hematology and Oncology

## 2016-02-22 ENCOUNTER — Other Ambulatory Visit: Payer: Self-pay | Admitting: Hematology and Oncology

## 2016-02-22 DIAGNOSIS — D45 Polycythemia vera: Secondary | ICD-10-CM

## 2016-02-22 DIAGNOSIS — I82409 Acute embolism and thrombosis of unspecified deep veins of unspecified lower extremity: Secondary | ICD-10-CM

## 2016-02-22 LAB — CBC WITH DIFFERENTIAL/PLATELET
Basophils Absolute: 0.1 10*3/uL (ref 0–0.1)
Basophils Relative: 1 %
Eosinophils Absolute: 0.2 10*3/uL (ref 0–0.7)
Eosinophils Relative: 4 %
HCT: 43.3 % (ref 35.0–47.0)
Hemoglobin: 13.7 g/dL (ref 12.0–16.0)
Lymphocytes Relative: 19 %
Lymphs Abs: 1.1 10*3/uL (ref 1.0–3.6)
MCH: 25.1 pg — ABNORMAL LOW (ref 26.0–34.0)
MCHC: 31.6 g/dL — ABNORMAL LOW (ref 32.0–36.0)
MCV: 79.3 fL — ABNORMAL LOW (ref 80.0–100.0)
Monocytes Absolute: 0.2 10*3/uL (ref 0.2–0.9)
Monocytes Relative: 3 %
Neutro Abs: 4.4 10*3/uL (ref 1.4–6.5)
Neutrophils Relative %: 73 %
Platelets: 745 10*3/uL — ABNORMAL HIGH (ref 150–440)
RBC: 5.46 MIL/uL — ABNORMAL HIGH (ref 3.80–5.20)
RDW: 21.6 % — ABNORMAL HIGH (ref 11.5–14.5)
WBC: 5.9 10*3/uL (ref 3.6–11.0)

## 2016-02-22 LAB — PROTIME-INR
INR: 1.71
Prothrombin Time: 20.1 seconds — ABNORMAL HIGH (ref 11.4–15.0)

## 2016-02-22 NOTE — Telephone Encounter (Signed)
Re:  PT/INR  Called about INR of 1.71.  She has been on Coumadin 5 mg 3 days/week (MWF) and 4 mg 4 days/week (total weekly dose 31 mg).  Dose will be increased by 10% to Coumadin 5 mg 6 days/week and 4 mg 1 day/week (total weekly dose 34 mg).  Repeat INR in 1 week.   Lequita Asal, MD

## 2016-02-29 ENCOUNTER — Inpatient Hospital Stay: Payer: Medicare Other

## 2016-02-29 ENCOUNTER — Other Ambulatory Visit: Payer: Medicare Other

## 2016-02-29 ENCOUNTER — Telehealth: Payer: Self-pay

## 2016-02-29 DIAGNOSIS — D45 Polycythemia vera: Secondary | ICD-10-CM | POA: Diagnosis not present

## 2016-02-29 DIAGNOSIS — I82409 Acute embolism and thrombosis of unspecified deep veins of unspecified lower extremity: Secondary | ICD-10-CM

## 2016-02-29 LAB — CBC WITH DIFFERENTIAL/PLATELET
Basophils Absolute: 0 10*3/uL (ref 0–0.1)
Basophils Relative: 0 %
Eosinophils Absolute: 0.2 10*3/uL (ref 0–0.7)
Eosinophils Relative: 3 %
HCT: 38.7 % (ref 35.0–47.0)
Hemoglobin: 12.5 g/dL (ref 12.0–16.0)
Lymphocytes Relative: 18 %
Lymphs Abs: 1.1 10*3/uL (ref 1.0–3.6)
MCH: 25.5 pg — ABNORMAL LOW (ref 26.0–34.0)
MCHC: 32.1 g/dL (ref 32.0–36.0)
MCV: 79.2 fL — ABNORMAL LOW (ref 80.0–100.0)
Monocytes Absolute: 0.1 10*3/uL — ABNORMAL LOW (ref 0.2–0.9)
Monocytes Relative: 3 %
Neutro Abs: 4.6 10*3/uL (ref 1.4–6.5)
Neutrophils Relative %: 76 %
Platelets: 899 10*3/uL — ABNORMAL HIGH (ref 150–440)
RBC: 4.89 MIL/uL (ref 3.80–5.20)
RDW: 21.6 % — ABNORMAL HIGH (ref 11.5–14.5)
WBC: 6.1 10*3/uL (ref 3.6–11.0)

## 2016-02-29 LAB — PROTIME-INR
INR: 2.23
Prothrombin Time: 24.5 seconds — ABNORMAL HIGH (ref 11.4–15.0)

## 2016-02-29 NOTE — Telephone Encounter (Signed)
Called pt per MD to report per MD INR therapeutic.  Continue same dose of Coumadin.  If INR therapeutic next week, then we will start spacing out INR checks.  Pt on schedule already for next week   MD requested to know hydrea dosage:  Per [name] pt takes 2 pills on Sundays  And the rest of the days she takes 3 Per MD increase Hydrea to 3 pills on 6 days and 4 pills on 1 day.  Pt grandaghter reported an understanding.  No other concerns noted.

## 2016-03-07 ENCOUNTER — Inpatient Hospital Stay (HOSPITAL_BASED_OUTPATIENT_CLINIC_OR_DEPARTMENT_OTHER): Payer: Medicare Other | Admitting: Hematology and Oncology

## 2016-03-07 ENCOUNTER — Other Ambulatory Visit: Payer: Self-pay | Admitting: Hematology and Oncology

## 2016-03-07 ENCOUNTER — Inpatient Hospital Stay: Payer: Medicare Other

## 2016-03-07 VITALS — BP 164/94 | HR 73 | Temp 97.6°F | Wt 187.0 lb

## 2016-03-07 DIAGNOSIS — Z7982 Long term (current) use of aspirin: Secondary | ICD-10-CM

## 2016-03-07 DIAGNOSIS — Z7901 Long term (current) use of anticoagulants: Secondary | ICD-10-CM | POA: Diagnosis not present

## 2016-03-07 DIAGNOSIS — Z86718 Personal history of other venous thrombosis and embolism: Secondary | ICD-10-CM

## 2016-03-07 DIAGNOSIS — Z86711 Personal history of pulmonary embolism: Secondary | ICD-10-CM

## 2016-03-07 DIAGNOSIS — M1990 Unspecified osteoarthritis, unspecified site: Secondary | ICD-10-CM

## 2016-03-07 DIAGNOSIS — I2782 Chronic pulmonary embolism: Secondary | ICD-10-CM

## 2016-03-07 DIAGNOSIS — Z8673 Personal history of transient ischemic attack (TIA), and cerebral infarction without residual deficits: Secondary | ICD-10-CM

## 2016-03-07 DIAGNOSIS — I10 Essential (primary) hypertension: Secondary | ICD-10-CM

## 2016-03-07 DIAGNOSIS — D45 Polycythemia vera: Secondary | ICD-10-CM

## 2016-03-07 DIAGNOSIS — Z933 Colostomy status: Secondary | ICD-10-CM

## 2016-03-07 DIAGNOSIS — I82409 Acute embolism and thrombosis of unspecified deep veins of unspecified lower extremity: Secondary | ICD-10-CM

## 2016-03-07 DIAGNOSIS — Z79899 Other long term (current) drug therapy: Secondary | ICD-10-CM

## 2016-03-07 LAB — CBC WITH DIFFERENTIAL/PLATELET
Basophils Absolute: 0 10*3/uL (ref 0–0.1)
Basophils Relative: 1 %
Eosinophils Absolute: 0.1 10*3/uL (ref 0–0.7)
Eosinophils Relative: 4 %
HCT: 38.1 % (ref 35.0–47.0)
Hemoglobin: 12.1 g/dL (ref 12.0–16.0)
Lymphocytes Relative: 35 %
Lymphs Abs: 1.2 10*3/uL (ref 1.0–3.6)
MCH: 25 pg — ABNORMAL LOW (ref 26.0–34.0)
MCHC: 31.8 g/dL — ABNORMAL LOW (ref 32.0–36.0)
MCV: 78.6 fL — ABNORMAL LOW (ref 80.0–100.0)
Monocytes Absolute: 0.1 10*3/uL — ABNORMAL LOW (ref 0.2–0.9)
Monocytes Relative: 4 %
Neutro Abs: 2 10*3/uL (ref 1.4–6.5)
Neutrophils Relative %: 56 %
Platelets: 645 10*3/uL — ABNORMAL HIGH (ref 150–440)
RBC: 4.85 MIL/uL (ref 3.80–5.20)
RDW: 22.1 % — ABNORMAL HIGH (ref 11.5–14.5)
WBC: 3.5 10*3/uL — ABNORMAL LOW (ref 3.6–11.0)

## 2016-03-07 LAB — COMPREHENSIVE METABOLIC PANEL
ALT: 10 U/L — ABNORMAL LOW (ref 14–54)
AST: 16 U/L (ref 15–41)
Albumin: 3.2 g/dL — ABNORMAL LOW (ref 3.5–5.0)
Alkaline Phosphatase: 73 U/L (ref 38–126)
Anion gap: 4 — ABNORMAL LOW (ref 5–15)
BUN: 12 mg/dL (ref 6–20)
CO2: 29 mmol/L (ref 22–32)
Calcium: 9.1 mg/dL (ref 8.9–10.3)
Chloride: 107 mmol/L (ref 101–111)
Creatinine, Ser: 0.43 mg/dL — ABNORMAL LOW (ref 0.44–1.00)
GFR calc Af Amer: >60 mL/min (ref >60)
GFR calc non Af Amer: >60 mL/min (ref >60)
Glucose, Bld: 102 mg/dL — ABNORMAL HIGH (ref 65–99)
Potassium: 4.4 mmol/L (ref 3.5–5.1)
Sodium: 140 mmol/L (ref 135–145)
Total Bilirubin: 0.8 mg/dL (ref 0.3–1.2)
Total Protein: 7.3 g/dL (ref 6.5–8.1)

## 2016-03-07 LAB — PROTIME-INR
INR: 1.81
Prothrombin Time: 20.9 seconds — ABNORMAL HIGH (ref 11.4–15.0)

## 2016-03-07 NOTE — Progress Notes (Addendum)
[facility] day:  03/07/2016  Chief Complaint: Yolanda Wells is a 82 y.o. female with polycythemia rubra vera (PV) who is seen for 1 month assessment on hydroxyurea and possible phlebotomy.  HPI:   The patient was last seen in the medical oncology clinic on 02/08/2016.  At that time, she was seen for 1 month assessment on hydrea and adjustment of her Coumadin.  Counts included a hematocrit 39.7, hemoglobin 12.6, MCV 79.1, platelets 887,000, white count 6300 with an ANC of 4900.  INR was 1.69.  Her hydroxyurea was increased to 1500 mg 6 days a week and 1000 mg 1 day weekend (20 pills a week).  Her Coumadin was increased to 5 mg 3 days a week and 4 mg 4 days a week (total weekly dose 31 mg).  Her counts have been followed closely and her and medications adjusted.  She required a phlebotomy on 02/22/2016 for a hematocrit of 43.3.  On 02/29/2016, her hydroxyurea was increased to 3 pills a day for a platelet count of 899,000.    On 02/22/2016, INR was 1.71.  Her Coumadin was switched to 5 mg 6 days a week and 4 mg 1 day a week (total weekly dose 34 mg).  INR on 02/29/2016 was 2.23.  No changes were made in her Coumadin. She notes that she is feeling better and eating well. She describes eating meat and green leafy vegetables.  Symptomatically, she her only complaint is significant stiffness where she can't move around.  She wants to "run around".  She denies any bruising or bleeding.  She has had no infections.  [birth year].   Past Medical History  Diagnosis Date  . Hypertension   . Deep venous thrombosis (HCC)     right lower extremity  . History of hysterectomy   . History of bilateral hip replacements   . Polycythemia vera ([place])   . Dependent edema   . Arthritis   . Collagen vascular disease ([place])   . Stroke [facility])     Pt reports she was told she had a stroke in may    Past Surgical History  Procedure Laterality Date  . Toe  amputation      right  . Joint replacement      Left and Right Hip  . Abdominal hysterectomy    . Peripheral vascular catheterization N/A 04/02/2015    Procedure: Visceral Angiography;  Surgeon: Algernon Huxley, MD;  Location: [place] [facility];  Service: Cardiovascular;  Laterality: N/A;  . Peripheral vascular catheterization N/A 04/02/2015    Procedure: Visceral Artery Intervention;  Surgeon: Algernon Huxley, MD;  Location: [place] [facility];  Service: Cardiovascular;  Laterality: N/A;  . Colectomy with colostomy creation/hartmann procedure N/A 04/03/2015    Procedure: COLECTOMY WITH COLOSTOMY CREATION/HARTMANN PROCEDURE;  Surgeon: Sherri Rad, MD;  Location: [facility];  Service: General;  Laterality: N/A;  . Cardiac catheterization Right 04/03/2015    Procedure: CENTRAL LINE INSERTION;  Surgeon: Sherri Rad, MD;  Location: [facility];  Service: General;  Laterality: Right;  . Peripheral vascular catheterization N/A 05/25/2015    Procedure: IVC Filter Insertion;  Surgeon: Katha Cabal, MD;  Location: [place] [facility];  Service: Cardiovascular;  Laterality: N/A;    Family History  Problem Relation Age of Onset  . Heart attack Father   . Hypertension    . Arthritis-Osteo    . COPD Father   . Diabetes Brother   .  Osteosarcoma Son     Social History:  reports that she has never smoked. She has never used smokeless tobacco. She reports that she does not drink alcohol or use illicit drugs.  She lives alone on [address] at [place] (independent living). Her grand-daughter visits regularly [name] [phone]).  The patient is accompanied by her grand-daughter today.  Allergies: No Known Allergies  Current Medications: Current Outpatient Prescriptions  Medication Sig Dispense Refill  . aspirin EC 81 MG tablet Take 1 tablet (81 mg total) by mouth daily. 30 tablet 2  . docusate sodium (COLACE) 100 MG capsule Take 100 mg by mouth 2 (two) times daily as needed for mild constipation.      . folic acid (FOLVITE) A999333 MCG tablet Take 400 mcg by mouth daily.    . furosemide (LASIX) 20 MG tablet Take 20 mg by mouth daily as needed for edema. Reported on 02/08/2016    . hydroxyurea (HYDREA) 500 MG capsule Take 2-3 capsules (1,000-1,500 mg total) by mouth daily. Pt takes two capsules on Tuesday, Thursday, Saturday, and Sunday.   Pt takes three capsules on Monday, Wednesday, and Friday. 34 capsule 0  . ipratropium-albuterol (DUONEB) 0.5-2.5 (3) MG/3ML SOLN Take 3 mLs by nebulization every 4 (four) hours. (Patient taking differently: Take 3 mLs by nebulization every 4 (four) hours as needed (for wheezing/shortness of breath). ) 360 mL 6  . ketorolac (TORADOL) 10 MG tablet Take 1 tablet (10 mg total) by mouth every 6 (six) hours. 20 tablet 0  . menthol-cetylpyridinium (CEPACOL) 3 MG lozenge Take 1 lozenge (3 mg total) by mouth as needed for sore throat. 100 tablet 12  . ondansetron (ZOFRAN-ODT) 4 MG disintegrating tablet Take 1 tablet (4 mg total) by mouth every 8 (eight) hours as needed for nausea or vomiting. 20 tablet 0  . oxyCODONE-acetaminophen (PERCOCET/ROXICET) 5-325 MG tablet Take 1 tablet by mouth every 6 (six) hours as needed for severe pain. 20 tablet 0  . pantoprazole (PROTONIX) 40 MG tablet Take 1 tablet (40 mg total) by mouth daily. 30 tablet 0  . Polyethylene Glycol POWD Take 17 g by mouth daily. 527 g 5  . potassium chloride SA (K-DUR,KLOR-CON) 20 MEQ tablet Take 1 tablet (20 mEq total) by mouth 2 (two) times daily. 30 tablet 0  . warfarin (COUMADIN) 1 MG tablet     . warfarin (COUMADIN) 4 MG tablet      No current facility-administered medications for this visit.    Review of Systems:  GENERAL:  Feels "pretty good".  No fevers or sweats.  Weight stable. PERFORMANCE STATUS (ECOG):  3 HEENT:  No visual changes, runny nose, sore throat, mouth sores or tenderness. Lungs:  No shortness of breath or cough.  No hemoptysis. Cardiac:  No chest pain, palpitations, orthopnea, or  PND. GI:  No nausea, vomiting, diarrhea, constipation, melena or hematochezia. GU:  No urgency, frequency, dysuria, or hematuria. Musculoskeletal:  Stiffness.  Knee problems ("bone on bone").  No muscle tenderness. Extremities:  Chronic swelling in legs. Skin:  No rashes or skin changes. Neuro:  No headache, numbness or weakness, balance or coordination issues. Endocrine:  No diabetes, thyroid issues, hot flashes or night sweats. Psych:  No mood changes, depression or anxiety. Pain:  No focal pain. Review of systems:  All other systems reviewed and found to be negative.  Physical Exam: Blood pressure 164/94, pulse 73, temperature 97.6 F (36.4 C), temperature source Tympanic, weight 187 lb (84.823 kg). GENERAL:  Elderly woman sitting comfortably  in a wheelchair in the exam room in no acute distress. MENTAL STATUS:  Alert and oriented to person, place and time. HEAD:  Wearing a pink cap.  Gray hair.  Normocephalic, atraumatic, face symmetric, no Cushingoid features. EYES:  Brown eyes.  Pupils equal round and reactive to light and accomodation.  No conjunctivitis or scleral icterus. ENT:  Oropharynx clear without lesion.  Upper dentures.  Tongue normal. Mucous membranes moist.  RESPIRATORY:  Decreased breath sounds at the bases.  Clear to auscultation without rales, wheezes or rhonchi. CARDIOVASCULAR:  Regular rate and rhythm without murmur, rub or gallop. ABDOMEN:  Left sided colostomy.  Soft, non-tender, with active bowel sounds, and no appreciable hepatosplenomegaly.  No masses. SKIN:  No rashes, ulcers or lesions. EXTREMITIES:  No edema, skin discoloration or tenderness.  No palpable cords. LYMPH NODES: No palpable cervical, supraclavicular, axillary or inguinal adenopathy  NEUROLOGICAL: Unremarkable. PSYCH:  Appropriate.  Appointment on 03/07/2016  Component Date Value Ref Range Status  . Prothrombin Time 03/07/2016 20.9* 11.4 - 15.0 seconds Final  . INR 03/07/2016 1.81   Final  .  WBC 03/07/2016 3.5* 3.6 - 11.0 K/uL Final  . RBC 03/07/2016 4.85  3.80 - 5.20 MIL/uL Final  . Hemoglobin 03/07/2016 12.1  12.0 - 16.0 g/dL Final  . HCT 03/07/2016 38.1  35.0 - 47.0 % Final  . MCV 03/07/2016 78.6* 80.0 - 100.0 fL Final  . MCH 03/07/2016 25.0* 26.0 - 34.0 pg Final  . MCHC 03/07/2016 31.8* 32.0 - 36.0 g/dL Final  . RDW 03/07/2016 22.1* 11.5 - 14.5 % Final  . Platelets 03/07/2016 645* 150 - 440 K/uL Final  . Neutrophils Relative % 03/07/2016 56   Final  . Neutro Abs 03/07/2016 2.0  1.4 - 6.5 K/uL Final  . Lymphocytes Relative 03/07/2016 35   Final  . Lymphs Abs 03/07/2016 1.2  1.0 - 3.6 K/uL Final  . Monocytes Relative 03/07/2016 4   Final  . Monocytes Absolute 03/07/2016 0.1* 0.2 - 0.9 K/uL Final  . Eosinophils Relative 03/07/2016 4   Final  . Eosinophils Absolute 03/07/2016 0.1  0 - 0.7 K/uL Final  . Basophils Relative 03/07/2016 1   Final  . Basophils Absolute 03/07/2016 0.0  0 - 0.1 K/uL Final    Assessment:  Earlene Gruetzmacher is a 82 y.o. female African-American woman with JAK2+ polycythemia rubra vera (PV) previously on a phlebotomy program and hydroxyurea. She received P32 in an attempt to manage her counts in 03/2015.   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 She was diagnosed with bilateral lower extremity DVTs on 05/18/2015 and bilateral pulmonary emboli on 05/24/2015. She underwent IVC filter placement on 05/25/2015.  She is on a fluctuating dose of Coumadin (total weekly dose 34 mg).  She is on a baby aspirin.  She developed progressive erythrocytosis, thrombocytosis, and leukocytosis.  She underwent phlebotomy for a hematocrit of 55.4 on 09/23/2015.  Hematocrit decreased to 50.0, but has again increased after initiation of oral iron.  Platelet count has increased from 1.1 million to 1.4 million.  White count increased from 23,000 - 28,000 to  31,800.  She has been back on hydroxyurea (1000 mg a day) since 10/02/2015.  Initial dose was 1000 mg a day.  She is currently taking 1500 mg a day (total weekly dose: 21 pills).   She requires periodic phlebotomies (goal hematocrit 42).  Platelet count has improved.  Goal platelet count is  400,000.  Hematocrit and ANC are adequate.  Platelet count is 645,000.  Ferritin was 9 on 01/25/2016.  Symptomatically, she feels good.  Exam reveals no appreciable hepatosplenomegaly.  INR is 1.81.  Plan: 1.  Labs today:  CBC with diff, CMP, PT/INR. 2.  No phlebotomy today.  Hematocrit goal 42.  3.  Continue current dose of hydroxyurea to 1500 mg 7 days a week (21 pills). 4.  Continue baby aspirin. 5.  Increase Coumadin to 5 mg 6 days/week and 6 mg 1 day/week (total weekly dose: 36 mg). 6.  Anticipate decreasing frequency of visits if INR stable and does not require phlebotomy. 7.  RTC in 1 week for PT/INR. 8.  RTC in 2 weeks for labs (CBC with diff, PT/INR) and +/- phlebotomy. 9.  RTC in 4 weeks for MD assess, labs (CBC with diff, CMP, PT/INR, ferritin) +/- phlebotomy.   Lequita Asal, MD  03/07/2016, 9:33 AM

## 2016-03-09 ENCOUNTER — Encounter: Payer: Self-pay | Admitting: Hematology and Oncology

## 2016-03-09 NOTE — Addendum Note (Signed)
Addended by: Luella Cook on: 03/09/2016 07:43 PM   Modules accepted: Orders

## 2016-03-14 ENCOUNTER — Inpatient Hospital Stay: Payer: Medicare Other | Attending: Hematology and Oncology

## 2016-03-14 ENCOUNTER — Telehealth: Payer: Self-pay

## 2016-03-14 DIAGNOSIS — R600 Localized edema: Secondary | ICD-10-CM | POA: Insufficient documentation

## 2016-03-14 DIAGNOSIS — Z86718 Personal history of other venous thrombosis and embolism: Secondary | ICD-10-CM | POA: Diagnosis not present

## 2016-03-14 DIAGNOSIS — Z7901 Long term (current) use of anticoagulants: Secondary | ICD-10-CM | POA: Diagnosis not present

## 2016-03-14 DIAGNOSIS — Z79899 Other long term (current) drug therapy: Secondary | ICD-10-CM | POA: Diagnosis not present

## 2016-03-14 DIAGNOSIS — Z8673 Personal history of transient ischemic attack (TIA), and cerebral infarction without residual deficits: Secondary | ICD-10-CM | POA: Insufficient documentation

## 2016-03-14 DIAGNOSIS — D45 Polycythemia vera: Secondary | ICD-10-CM | POA: Diagnosis present

## 2016-03-14 DIAGNOSIS — Z7982 Long term (current) use of aspirin: Secondary | ICD-10-CM | POA: Diagnosis not present

## 2016-03-14 DIAGNOSIS — M1990 Unspecified osteoarthritis, unspecified site: Secondary | ICD-10-CM | POA: Diagnosis not present

## 2016-03-14 DIAGNOSIS — I10 Essential (primary) hypertension: Secondary | ICD-10-CM | POA: Diagnosis not present

## 2016-03-14 DIAGNOSIS — I2782 Chronic pulmonary embolism: Secondary | ICD-10-CM

## 2016-03-14 LAB — PROTIME-INR
INR: 1.99
Prothrombin Time: 22.5 seconds — ABNORMAL HIGH (ref 11.4–15.0)

## 2016-03-14 NOTE — Telephone Encounter (Signed)
Called and spoke with pt's granddaughter.  Per MD remain current dose of coumadin no changes.  Pt's granddaughter verbalized an understanding no other changes

## 2016-03-21 ENCOUNTER — Inpatient Hospital Stay: Payer: Medicare Other

## 2016-03-21 ENCOUNTER — Other Ambulatory Visit: Payer: Self-pay | Admitting: Hematology and Oncology

## 2016-03-21 DIAGNOSIS — D45 Polycythemia vera: Secondary | ICD-10-CM

## 2016-03-21 DIAGNOSIS — I2782 Chronic pulmonary embolism: Secondary | ICD-10-CM

## 2016-03-21 LAB — CBC WITH DIFFERENTIAL/PLATELET
Basophils Absolute: 0 10*3/uL (ref 0–0.1)
Basophils Relative: 1 %
Eosinophils Absolute: 0.1 10*3/uL (ref 0–0.7)
Eosinophils Relative: 4 %
HCT: 40.9 % (ref 35.0–47.0)
Hemoglobin: 12.9 g/dL (ref 12.0–16.0)
Lymphocytes Relative: 23 %
Lymphs Abs: 0.9 10*3/uL — ABNORMAL LOW (ref 1.0–3.6)
MCH: 25.1 pg — ABNORMAL LOW (ref 26.0–34.0)
MCHC: 31.5 g/dL — ABNORMAL LOW (ref 32.0–36.0)
MCV: 79.6 fL — ABNORMAL LOW (ref 80.0–100.0)
Monocytes Absolute: 0.1 10*3/uL — ABNORMAL LOW (ref 0.2–0.9)
Monocytes Relative: 3 %
Neutro Abs: 2.8 10*3/uL (ref 1.4–6.5)
Neutrophils Relative %: 69 %
Platelets: 732 10*3/uL — ABNORMAL HIGH (ref 150–440)
RBC: 5.13 MIL/uL (ref 3.80–5.20)
RDW: 26.1 % — ABNORMAL HIGH (ref 11.5–14.5)
WBC: 4 10*3/uL (ref 3.6–11.0)

## 2016-03-21 LAB — PROTIME-INR
INR: 1.85
Prothrombin Time: 21.3 seconds — ABNORMAL HIGH (ref 11.4–15.0)

## 2016-03-24 ENCOUNTER — Telehealth: Payer: Self-pay | Admitting: *Deleted

## 2016-03-24 MED ORDER — WARFARIN SODIUM 1 MG PO TABS: ORAL_TABLET | ORAL | Status: DC

## 2016-03-24 MED ORDER — WARFARIN SODIUM 3 MG PO TABS: ORAL_TABLET | ORAL | Status: DC

## 2016-03-24 NOTE — Telephone Encounter (Signed)
-----   Message from Lequita Asal, MD sent at 03/21/2016  4:26 PM EDT ----- Regarding: Please call patient Is she taking...  Hydroxyurea to 1500 mg 7 days a week (total 21 pills a week)  Coumadin to 5 mg 6 days/week and 6 mg 1 day/week (total weekly dose: 36 mg)  If so,  Increase hydroxyurea to 4 pills 2 days a week and 3 pills 5 days a week.   Each pill is 500 mg.  Increase Coumadin to: 5 mg 4 days/week and 6 mg 3 days/week (spread out)  M  ----- Message -----    From: [facility] [place]: 03/21/2016  10:24 AM      To: Lequita Asal, MD

## 2016-03-24 NOTE — Telephone Encounter (Signed)
Called Yolanda Wells and left message on Friday but di dnot hear back from her and called this am and she states that grandmother is taking 4 hydrea on Sunday and all the other days 3 a day.   Coumadin 6 mg on Sunday and all the rest of the days she takes 5 mg.   Gave new changes after speaking to [place] the Hydrea is 3 capsules on M-F, and 4 on sat, and Sun. Coumadin 5 mg on Mon, wed, Fri, and Sun. 6 mg on Tues, Thurs, Sat.  Yolanda Wells wrote the new directions and needs refills called into [facility] and sheis using 3 mg and 1 mg to make the doses for coumadin. Spoke to [place] and got verbal permission to send in refills

## 2016-04-03 ENCOUNTER — Other Ambulatory Visit: Payer: Self-pay

## 2016-04-03 ENCOUNTER — Other Ambulatory Visit: Payer: Self-pay | Admitting: Hematology and Oncology

## 2016-04-03 DIAGNOSIS — D45 Polycythemia vera: Secondary | ICD-10-CM

## 2016-04-03 DIAGNOSIS — I82409 Acute embolism and thrombosis of unspecified deep veins of unspecified lower extremity: Secondary | ICD-10-CM

## 2016-04-04 ENCOUNTER — Telehealth: Payer: Self-pay

## 2016-04-04 ENCOUNTER — Inpatient Hospital Stay (HOSPITAL_BASED_OUTPATIENT_CLINIC_OR_DEPARTMENT_OTHER): Payer: Medicare Other | Admitting: Hematology and Oncology

## 2016-04-04 ENCOUNTER — Inpatient Hospital Stay: Payer: Medicare Other

## 2016-04-04 DIAGNOSIS — Z79899 Other long term (current) drug therapy: Secondary | ICD-10-CM

## 2016-04-04 DIAGNOSIS — Z7901 Long term (current) use of anticoagulants: Secondary | ICD-10-CM

## 2016-04-04 DIAGNOSIS — M1990 Unspecified osteoarthritis, unspecified site: Secondary | ICD-10-CM | POA: Diagnosis not present

## 2016-04-04 DIAGNOSIS — R600 Localized edema: Secondary | ICD-10-CM

## 2016-04-04 DIAGNOSIS — Z7982 Long term (current) use of aspirin: Secondary | ICD-10-CM

## 2016-04-04 DIAGNOSIS — I82409 Acute embolism and thrombosis of unspecified deep veins of unspecified lower extremity: Secondary | ICD-10-CM

## 2016-04-04 DIAGNOSIS — D45 Polycythemia vera: Secondary | ICD-10-CM | POA: Diagnosis not present

## 2016-04-04 DIAGNOSIS — Z86718 Personal history of other venous thrombosis and embolism: Secondary | ICD-10-CM

## 2016-04-04 DIAGNOSIS — I10 Essential (primary) hypertension: Secondary | ICD-10-CM | POA: Diagnosis not present

## 2016-04-04 DIAGNOSIS — Z8673 Personal history of transient ischemic attack (TIA), and cerebral infarction without residual deficits: Secondary | ICD-10-CM

## 2016-04-04 LAB — COMPREHENSIVE METABOLIC PANEL
ALT: 9 U/L — ABNORMAL LOW (ref 14–54)
AST: 17 U/L (ref 15–41)
Albumin: 3.3 g/dL — ABNORMAL LOW (ref 3.5–5.0)
Alkaline Phosphatase: 76 U/L (ref 38–126)
Anion gap: 3 — ABNORMAL LOW (ref 5–15)
BUN: 11 mg/dL (ref 6–20)
CO2: 26 mmol/L (ref 22–32)
Calcium: 8.6 mg/dL — ABNORMAL LOW (ref 8.9–10.3)
Chloride: 107 mmol/L (ref 101–111)
Creatinine, Ser: 0.46 mg/dL (ref 0.44–1.00)
GFR calc Af Amer: >60 mL/min (ref >60)
GFR calc non Af Amer: >60 mL/min (ref >60)
Glucose, Bld: 102 mg/dL — ABNORMAL HIGH (ref 65–99)
Potassium: 4 mmol/L (ref 3.5–5.1)
Sodium: 136 mmol/L (ref 135–145)
Total Bilirubin: 0.8 mg/dL (ref 0.3–1.2)
Total Protein: 7.4 g/dL (ref 6.5–8.1)

## 2016-04-04 LAB — CBC WITH DIFFERENTIAL/PLATELET
Basophils Absolute: 0 10*3/uL (ref 0–0.1)
Basophils Relative: 1 %
Eosinophils Absolute: 0.1 10*3/uL (ref 0–0.7)
Eosinophils Relative: 3 %
HCT: 39.1 % (ref 35.0–47.0)
Hemoglobin: 12.7 g/dL (ref 12.0–16.0)
Lymphocytes Relative: 22 %
Lymphs Abs: 1.1 10*3/uL (ref 1.0–3.6)
MCH: 25.5 pg — ABNORMAL LOW (ref 26.0–34.0)
MCHC: 32.4 g/dL (ref 32.0–36.0)
MCV: 78.9 fL — ABNORMAL LOW (ref 80.0–100.0)
Monocytes Absolute: 0.2 10*3/uL (ref 0.2–0.9)
Monocytes Relative: 4 %
Neutro Abs: 3.5 10*3/uL (ref 1.4–6.5)
Neutrophils Relative %: 70 %
Platelets: 745 10*3/uL — ABNORMAL HIGH (ref 150–440)
RBC: 4.96 MIL/uL (ref 3.80–5.20)
RDW: 28.3 % — ABNORMAL HIGH (ref 11.5–14.5)
WBC: 5 10*3/uL (ref 3.6–11.0)

## 2016-04-04 LAB — FERRITIN: Ferritin: 46 ng/mL (ref 11–307)

## 2016-04-04 LAB — PROTIME-INR
INR: 2.68
Prothrombin Time: 28.1 seconds — ABNORMAL HIGH (ref 11.4–15.0)

## 2016-04-04 NOTE — Telephone Encounter (Signed)
Called and spoke with pt's granddaughter INR good per MD recheck next week and if still good we will move to checking every 2 weeks.  Pt's granddaughter [name] verbalized an understanding.  No other concerns noted.

## 2016-04-04 NOTE — Progress Notes (Signed)
No changes since last visit pt only reports dry skin or a itchy skin to left lower leg

## 2016-04-05 ENCOUNTER — Encounter: Payer: Self-pay | Admitting: Hematology and Oncology

## 2016-04-05 NOTE — Progress Notes (Signed)
[facility] day:  04/04/2016  Chief Complaint: Yolanda Wells is a 82 y.o. female with polycythemia rubra vera (PV) who is seen for 1 month assessment on hydroxyurea and possible phlebotomy.  HPI:   The patient was last seen in the medical oncology clinic on 03/07/2016.  At that time, she only complained of stiffness and difficulty getting around.  Counts included a hematocrit 38.1, hemoglobin 12.1, MCV 78.6, platelets 645,000, white count 3500 with an ANC of 2000.  INR was 1.81.  Her hydroxyurea was maintained at 1500 mg a day as her platelet count was decreasing after a recent adjustment in her medications.  Coumadin was increased to 5 mg 6 days/week and 6 mg 1 day/week (total weekly dose: 36 mg).  Her counts and PT/INR have been followed during the interim.  INR has ranged between 1.81 and 1.99.  She is on Coumadin 5 mg on Mon, Wed, Fri, and Sunday and 6 mg on Tues, Thurs and Sat (total weekly dose : 38 mg).  Her hydroxyurea dose is 4 pills on Saturday and Sunday and 3 pills on Monday - Friday (23 pills/week).  Symptomatically, she feels good.  She is eating well.  She denies any bruising or bleeding.  She denies any infections.  She comments that her skin is dry.   Past Medical History  Diagnosis Date  . Hypertension   . Deep venous thrombosis (HCC)     right lower extremity  . History of hysterectomy   . History of bilateral hip replacements   . Polycythemia vera ([place])   . Dependent edema   . Arthritis   . Collagen vascular disease ([place])   . Stroke [facility])     Pt reports she was told she had a stroke in may    Past Surgical History  Procedure Laterality Date  . Toe amputation      right  . Joint replacement      Left and Right Hip  . Abdominal hysterectomy    . Peripheral vascular catheterization N/A 04/02/2015    Procedure: Visceral Angiography;  Surgeon: Algernon Huxley, MD;  Location: [place] [facility];  Service: Cardiovascular;   Laterality: N/A;  . Peripheral vascular catheterization N/A 04/02/2015    Procedure: Visceral Artery Intervention;  Surgeon: Algernon Huxley, MD;  Location: [place] [facility];  Service: Cardiovascular;  Laterality: N/A;  . Colectomy with colostomy creation/hartmann procedure N/A 04/03/2015    Procedure: COLECTOMY WITH COLOSTOMY CREATION/HARTMANN PROCEDURE;  Surgeon: Sherri Rad, MD;  Location: [facility] ORS;  Service: General;  Laterality: N/A;  . Cardiac catheterization Right 04/03/2015    Procedure: CENTRAL LINE INSERTION;  Surgeon: Sherri Rad, MD;  Location: [facility] ORS;  Service: General;  Laterality: Right;  . Peripheral vascular catheterization N/A 05/25/2015    Procedure: IVC Filter Insertion;  Surgeon: Katha Cabal, MD;  Location: [place] [facility];  Service: Cardiovascular;  Laterality: N/A;    Family History  Problem Relation Age of Onset  . Heart attack Father   . Hypertension    . Arthritis-Osteo    . COPD Father   . Diabetes Brother   . Osteosarcoma Son     Social History:  reports that she has never smoked. She has never used smokeless tobacco. She reports that she does not drink alcohol or use illicit drugs.  She lives alone on [address] at [place] (independent living). Her grand-daughter visits regularly [name] [phone]).  The patient is  accompanied by her grand-daughter today.  Allergies: No Known Allergies  Current Medications: Current Outpatient Prescriptions  Medication Sig Dispense Refill  . aspirin EC 81 MG tablet Take 1 tablet (81 mg total) by mouth daily. 30 tablet 2  . docusate sodium (COLACE) 100 MG capsule Take 100 mg by mouth 2 (two) times daily as needed for mild constipation.     . folic acid (FOLVITE) 161 MCG tablet Take 400 mcg by mouth daily.    . furosemide (LASIX) 20 MG tablet Take 20 mg by mouth daily as needed for edema. Reported on 02/08/2016    . hydroxyurea (HYDREA) 500 MG capsule Take 2-3 capsules (1,000-1,500 mg total) by mouth daily. Pt  takes two capsules on Tuesday, Thursday, Saturday, and Sunday.   Pt takes three capsules on Monday, Wednesday, and Friday. (Patient taking differently: Take 1,000-1,500 mg by mouth daily. Pt takes three capsules on Mon-Fri Pt takes four capsules on Sat & Sun) 34 capsule 0  . ipratropium-albuterol (DUONEB) 0.5-2.5 (3) MG/3ML SOLN Take 3 mLs by nebulization every 4 (four) hours. (Patient taking differently: Take 3 mLs by nebulization every 4 (four) hours as needed (for wheezing/shortness of breath). ) 360 mL 6  . ketorolac (TORADOL) 10 MG tablet Take 1 tablet (10 mg total) by mouth every 6 (six) hours. 20 tablet 0  . menthol-cetylpyridinium (CEPACOL) 3 MG lozenge Take 1 lozenge (3 mg total) by mouth as needed for sore throat. 100 tablet 12  . oxyCODONE-acetaminophen (PERCOCET/ROXICET) 5-325 MG tablet Take 1 tablet by mouth every 6 (six) hours as needed for severe pain. 20 tablet 0  . pantoprazole (PROTONIX) 40 MG tablet Take 1 tablet (40 mg total) by mouth daily. 30 tablet 0  . Polyethylene Glycol POWD Take 17 g by mouth daily. 527 g 5  . potassium chloride SA (K-DUR,KLOR-CON) 20 MEQ tablet Take 1 tablet (20 mEq total) by mouth 2 (two) times daily. 30 tablet 0  . warfarin (COUMADIN) 1 MG tablet Take '5mg'$  on Mon, Wed, Fri, and Sun.  Take 6 mg on Tues, Thurs, Sat 20 tablet 3  . warfarin (COUMADIN) 3 MG tablet Pt to take '5mg'$  on Mon, Wed, Fri, and Sun.  Then 6 mg tues, Thurs, Sat. 45 tablet 3  . ondansetron (ZOFRAN-ODT) 4 MG disintegrating tablet Take 1 tablet (4 mg total) by mouth every 8 (eight) hours as needed for nausea or vomiting. (Patient not taking: Reported on 04/04/2016) 20 tablet 0   No current facility-administered medications for this visit.    Review of Systems:  GENERAL:  Feels "good".  No fevers or sweats.  Weight down 1 pound. PERFORMANCE STATUS (ECOG):  3 HEENT:  No visual changes, runny nose, sore throat, mouth sores or tenderness. Lungs:  No shortness of breath or cough.  No  hemoptysis. Cardiac:  No chest pain, palpitations, orthopnea, or PND. GI:  No nausea, vomiting, diarrhea, constipation, melena or hematochezia. GU:  No urgency, frequency, dysuria, or hematuria. Musculoskeletal:  Stiffness.  Knee problems ("bone on bone").  No muscle tenderness. Extremities:  Chronic swelling in legs. Skin:  Dry skin.  No rashes or skin changes. Neuro:  No headache, numbness or weakness, balance or coordination issues. Endocrine:  No diabetes, thyroid issues, hot flashes or night sweats. Psych:  No mood changes, depression or anxiety. Pain:  No focal pain. Review of systems:  All other systems reviewed and found to be negative.  Physical Exam: Blood pressure 143/84, pulse 73, temperature 95.9 F (35.5 C), temperature source Tympanic,  resp. rate 18, height $RemoveBe'5\' 5"'[id number]$  (1.651 m), weight 188 lb (85.276 kg). GENERAL:  Elderly woman sitting comfortably in a wheelchair in the exam room in no acute distress. MENTAL STATUS:  Alert and oriented to person, place and time. HEAD: Pearline Cables hair.  Normocephalic, atraumatic, face symmetric, no Cushingoid features. EYES:  Brown eyes.  Pupils equal round and reactive to light and accomodation.  No conjunctivitis or scleral icterus. ENT:  Oropharynx clear without lesion.  Upper dentures.  Tongue normal. Mucous membranes moist.  RESPIRATORY:  Clear to auscultation without rales, wheezes or rhonchi. CARDIOVASCULAR:  Regular rate and rhythm without murmur, rub or gallop. ABDOMEN:  Left sided colostomy.  Soft, non-tender, with active bowel sounds, and no appreciable hepatosplenomegaly.  No masses. SKIN:  No rashes, ulcers or lesions. EXTREMITIES:  No edema, skin discoloration or tenderness.  No palpable cords. LYMPH NODES: No palpable cervical, supraclavicular, axillary or inguinal adenopathy  NEUROLOGICAL: Unremarkable. PSYCH:  Appropriate.  Office Visit on 04/04/2016  Component Date Value Ref Range Status  . WBC 04/04/2016 5.0  3.6 - 11.0 K/uL  Final  . RBC 04/04/2016 4.96  3.80 - 5.20 MIL/uL Final  . Hemoglobin 04/04/2016 12.7  12.0 - 16.0 g/dL Final  . HCT 04/04/2016 39.1  35.0 - 47.0 % Final  . MCV 04/04/2016 78.9* 80.0 - 100.0 fL Final  . MCH 04/04/2016 25.5* 26.0 - 34.0 pg Final  . MCHC 04/04/2016 32.4  32.0 - 36.0 g/dL Final  . RDW 04/04/2016 28.3* 11.5 - 14.5 % Final  . Platelets 04/04/2016 745* 150 - 440 K/uL Final  . Neutrophils Relative % 04/04/2016 70   Final  . Neutro Abs 04/04/2016 3.5  1.4 - 6.5 K/uL Final  . Lymphocytes Relative 04/04/2016 22   Final  . Lymphs Abs 04/04/2016 1.1  1.0 - 3.6 K/uL Final  . Monocytes Relative 04/04/2016 4   Final  . Monocytes Absolute 04/04/2016 0.2  0.2 - 0.9 K/uL Final  . Eosinophils Relative 04/04/2016 3   Final  . Eosinophils Absolute 04/04/2016 0.1  0 - 0.7 K/uL Final  . Basophils Relative 04/04/2016 1   Final  . Basophils Absolute 04/04/2016 0.0  0 - 0.1 K/uL Final  . Sodium 04/04/2016 136  135 - 145 mmol/L Final  . Potassium 04/04/2016 4.0  3.5 - 5.1 mmol/L Final  . Chloride 04/04/2016 107  101 - 111 mmol/L Final  . CO2 04/04/2016 26  22 - 32 mmol/L Final  . Glucose, Bld 04/04/2016 102* 65 - 99 mg/dL Final  . BUN 04/04/2016 11  6 - 20 mg/dL Final  . Creatinine, Ser 04/04/2016 0.46  0.44 - 1.00 mg/dL Final  . Calcium 04/04/2016 8.6* 8.9 - 10.3 mg/dL Final  . Total Protein 04/04/2016 7.4  6.5 - 8.1 g/dL Final  . Albumin 04/04/2016 3.3* 3.5 - 5.0 g/dL Final  . AST 04/04/2016 17  15 - 41 U/L Final  . ALT 04/04/2016 9* 14 - 54 U/L Final  . Alkaline Phosphatase 04/04/2016 76  38 - 126 U/L Final  . Total Bilirubin 04/04/2016 0.8  0.3 - 1.2 mg/dL Final  . GFR calc non Af Amer 04/04/2016 >60  >60 mL/min Final  . GFR calc Af Amer 04/04/2016 >60  >60 mL/min Final   Comment: (NOTE) The eGFR has been calculated using the CKD EPI equation. This calculation has not been validated in all clinical situations. eGFR's persistently <60 mL/min signify possible Chronic Kidney Disease.    . Anion gap 04/04/2016 3* 5 -  15 Final  . Prothrombin Time 04/04/2016 28.1* 11.4 - 15.0 seconds Final  . INR 04/04/2016 2.68   Final  . Ferritin 04/04/2016 46  11 - 307 ng/mL Final    Assessment:  Yolanda Wells is a 82 y.o. female African-American woman with JAK2+ polycythemia rubra vera (PV) previously on a phlebotomy program and hydroxyurea. She received P32 in an attempt to manage her counts in 03/2015.   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 She was diagnosed with bilateral lower extremity DVTs on 05/18/2015 and bilateral pulmonary emboli on 05/24/2015. She underwent IVC filter placement on 05/25/2015.  She is on a fluctuating dose of Coumadin (total weekly dose 38 mg).  She is on a baby aspirin.  She developed progressive erythrocytosis, thrombocytosis, and leukocytosis.  She underwent phlebotomy for a hematocrit of 55.4 on 09/23/2015.  Hematocrit decreased to 50.0, but has again increased after initiation of oral iron.  Platelet count has increased from 1.1 million to 1.4 million.  White count increased from 23,000 - 28,000 to 31,800.  She has been back on hydroxyurea (1000 mg a day) since 10/02/2015.  Initial dose was 1000 mg a day.  She is currently taking 4 pills 2 days a week and 3 pills 5 days/week (total weekly dose: 23 pills).   She requires periodic phlebotomies (goal hematocrit 42).  Platelet count remains elevated (secondary to PV and likely some component of iron deficiency).  Goal platelet count is 400,000.  Hematocrit and ANC are adequate.  Platelet count is 745,000.  Ferritin is 46 today.  Symptomatically, she feels good.  Exam reveals no appreciable hepatosplenomegaly.  INR is 2.68.  Plan: 1.  Labs today:  CBC with diff, CMP, PT/INR. 2.  No phlebotomy today.  Hematocrit goal 42.  3.  Increase hydroxyurea to 4 pills 3 days/week and 3 pills 4 days/week  (24 pills/week). 4.  Continue baby aspirin. 5.  Continue current dose of Coumadin to 5 mg 4 days/week and 6 mg 3 day/week (total weekly dose: 38 mg). 6.  Anticipate decreasing frequency of visits if INR stable and does not require phlebotomy. 7.  RTC weekly for PT/INR.  Decrease frequency if stable. 8.  RTC in 2 weeks for labs (CBC with diff, PT/INR) and +/- phlebotomy. 9.  RTC in 4 weeks for MD assess, labs (CBC with diff, CMP, PT/INR, ferritin) +/- phlebotomy.   Lequita Asal, MD  04/04/2016

## 2016-04-09 ENCOUNTER — Other Ambulatory Visit: Payer: Self-pay | Admitting: *Deleted

## 2016-04-09 MED ORDER — HYDROXYUREA 500 MG PO CAPS: ORAL_CAPSULE | ORAL | Status: DC

## 2016-04-10 ENCOUNTER — Other Ambulatory Visit: Payer: Self-pay | Admitting: Hematology and Oncology

## 2016-04-11 ENCOUNTER — Other Ambulatory Visit: Payer: Self-pay

## 2016-04-11 ENCOUNTER — Telehealth: Payer: Self-pay

## 2016-04-11 ENCOUNTER — Inpatient Hospital Stay: Payer: Medicare Other | Attending: Hematology and Oncology

## 2016-04-11 DIAGNOSIS — Z90710 Acquired absence of both cervix and uterus: Secondary | ICD-10-CM | POA: Diagnosis not present

## 2016-04-11 DIAGNOSIS — I82409 Acute embolism and thrombosis of unspecified deep veins of unspecified lower extremity: Secondary | ICD-10-CM

## 2016-04-11 DIAGNOSIS — D45 Polycythemia vera: Secondary | ICD-10-CM | POA: Insufficient documentation

## 2016-04-11 DIAGNOSIS — Z7901 Long term (current) use of anticoagulants: Secondary | ICD-10-CM | POA: Insufficient documentation

## 2016-04-11 DIAGNOSIS — Z79899 Other long term (current) drug therapy: Secondary | ICD-10-CM | POA: Insufficient documentation

## 2016-04-11 DIAGNOSIS — Z933 Colostomy status: Secondary | ICD-10-CM | POA: Insufficient documentation

## 2016-04-11 DIAGNOSIS — Z86711 Personal history of pulmonary embolism: Secondary | ICD-10-CM | POA: Insufficient documentation

## 2016-04-11 DIAGNOSIS — Z7982 Long term (current) use of aspirin: Secondary | ICD-10-CM | POA: Diagnosis not present

## 2016-04-11 DIAGNOSIS — Z96643 Presence of artificial hip joint, bilateral: Secondary | ICD-10-CM | POA: Insufficient documentation

## 2016-04-11 DIAGNOSIS — Z452 Encounter for adjustment and management of vascular access device: Secondary | ICD-10-CM | POA: Diagnosis not present

## 2016-04-11 DIAGNOSIS — Z86718 Personal history of other venous thrombosis and embolism: Secondary | ICD-10-CM | POA: Diagnosis not present

## 2016-04-11 DIAGNOSIS — M1990 Unspecified osteoarthritis, unspecified site: Secondary | ICD-10-CM | POA: Insufficient documentation

## 2016-04-11 DIAGNOSIS — Z8673 Personal history of transient ischemic attack (TIA), and cerebral infarction without residual deficits: Secondary | ICD-10-CM | POA: Diagnosis not present

## 2016-04-11 DIAGNOSIS — Z5181 Encounter for therapeutic drug level monitoring: Secondary | ICD-10-CM | POA: Diagnosis not present

## 2016-04-11 DIAGNOSIS — I10 Essential (primary) hypertension: Secondary | ICD-10-CM | POA: Insufficient documentation

## 2016-04-11 DIAGNOSIS — D72829 Elevated white blood cell count, unspecified: Secondary | ICD-10-CM | POA: Diagnosis not present

## 2016-04-11 DIAGNOSIS — D473 Essential (hemorrhagic) thrombocythemia: Secondary | ICD-10-CM | POA: Insufficient documentation

## 2016-04-11 LAB — PROTIME-INR
INR: 2.66
Prothrombin Time: 28 seconds — ABNORMAL HIGH (ref 11.4–15.0)

## 2016-04-11 MED ORDER — HYDROXYUREA 500 MG PO CAPS: ORAL_CAPSULE | ORAL | Status: DC

## 2016-04-11 NOTE — Telephone Encounter (Addendum)
Called pt's granddaughter.  Per Dr. Jacinto Reap pt to remain on same dosage of coumadin no changes.  [place] daughter verbalized an understanding   Pts grand daughter  asked for [place] to send [facility].  Will resend script.

## 2016-04-18 ENCOUNTER — Telehealth: Payer: Self-pay

## 2016-04-18 ENCOUNTER — Inpatient Hospital Stay: Payer: Medicare Other

## 2016-04-18 DIAGNOSIS — D45 Polycythemia vera: Secondary | ICD-10-CM

## 2016-04-18 DIAGNOSIS — I82409 Acute embolism and thrombosis of unspecified deep veins of unspecified lower extremity: Secondary | ICD-10-CM

## 2016-04-18 LAB — CBC WITH DIFFERENTIAL/PLATELET
Basophils Absolute: 0 10*3/uL (ref 0–0.1)
Basophils Relative: 1 %
Eosinophils Absolute: 0.1 10*3/uL (ref 0–0.7)
Eosinophils Relative: 4 %
HCT: 41.4 % (ref 35.0–47.0)
Hemoglobin: 13.2 g/dL (ref 12.0–16.0)
Lymphocytes Relative: 28 %
Lymphs Abs: 1 10*3/uL (ref 1.0–3.6)
MCH: 25.5 pg — ABNORMAL LOW (ref 26.0–34.0)
MCHC: 31.9 g/dL — ABNORMAL LOW (ref 32.0–36.0)
MCV: 80.1 fL (ref 80.0–100.0)
Monocytes Absolute: 0.1 10*3/uL — ABNORMAL LOW (ref 0.2–0.9)
Monocytes Relative: 3 %
Neutro Abs: 2.2 10*3/uL (ref 1.4–6.5)
Neutrophils Relative %: 64 %
Platelets: 604 10*3/uL — ABNORMAL HIGH (ref 150–440)
RBC: 5.17 MIL/uL (ref 3.80–5.20)
RDW: 26.7 % — ABNORMAL HIGH (ref 11.5–14.5)
WBC: 3.5 10*3/uL — ABNORMAL LOW (ref 3.6–11.0)

## 2016-04-18 LAB — PROTIME-INR
INR: 3.53
Prothrombin Time: 34.6 seconds — ABNORMAL HIGH (ref 11.4–15.0)

## 2016-04-18 NOTE — Telephone Encounter (Signed)
Called pt's grand daughter [name] per MD new coumadin dose is on Fridays take 4 mg and all other days 5 mg of Coumadin

## 2016-04-18 NOTE — Telephone Encounter (Signed)
Called pt per MD to know coumadin dosage  According to pts granddaughter pt is taking. On Monday. Wednesday, Friday, & Sunday 5mg  Coumadin On Tuesday, Thursday & Saturday 6 mg Coumadin.

## 2016-04-25 ENCOUNTER — Telehealth: Payer: Self-pay

## 2016-04-25 ENCOUNTER — Inpatient Hospital Stay: Payer: Medicare Other

## 2016-04-25 DIAGNOSIS — D45 Polycythemia vera: Secondary | ICD-10-CM

## 2016-04-25 DIAGNOSIS — I82409 Acute embolism and thrombosis of unspecified deep veins of unspecified lower extremity: Secondary | ICD-10-CM

## 2016-04-25 LAB — CBC WITH DIFFERENTIAL/PLATELET
Basophils Absolute: 0 10*3/uL (ref 0–0.1)
Basophils Relative: 0 %
Eosinophils Absolute: 0.1 10*3/uL (ref 0–0.7)
Eosinophils Relative: 3 %
HCT: 40.6 % (ref 35.0–47.0)
Hemoglobin: 13 g/dL (ref 12.0–16.0)
Lymphocytes Relative: 20 %
Lymphs Abs: 0.8 10*3/uL — ABNORMAL LOW (ref 1.0–3.6)
MCH: 25.8 pg — ABNORMAL LOW (ref 26.0–34.0)
MCHC: 32.1 g/dL (ref 32.0–36.0)
MCV: 80.4 fL (ref 80.0–100.0)
Monocytes Absolute: 0.1 10*3/uL — ABNORMAL LOW (ref 0.2–0.9)
Monocytes Relative: 3 %
Neutro Abs: 3.1 10*3/uL (ref 1.4–6.5)
Neutrophils Relative %: 74 %
Platelets: 675 10*3/uL — ABNORMAL HIGH (ref 150–440)
RBC: 5.05 MIL/uL (ref 3.80–5.20)
RDW: 26 % — ABNORMAL HIGH (ref 11.5–14.5)
WBC: 4.2 10*3/uL (ref 3.6–11.0)

## 2016-04-25 LAB — COMPREHENSIVE METABOLIC PANEL
ALT: 9 U/L — ABNORMAL LOW (ref 14–54)
AST: 19 U/L (ref 15–41)
Albumin: 3.1 g/dL — ABNORMAL LOW (ref 3.5–5.0)
Alkaline Phosphatase: 70 U/L (ref 38–126)
Anion gap: 8 (ref 5–15)
BUN: 9 mg/dL (ref 6–20)
CO2: 26 mmol/L (ref 22–32)
Calcium: 8.9 mg/dL (ref 8.9–10.3)
Chloride: 105 mmol/L (ref 101–111)
Creatinine, Ser: 0.52 mg/dL (ref 0.44–1.00)
GFR calc Af Amer: >60 mL/min (ref >60)
GFR calc non Af Amer: >60 mL/min (ref >60)
Glucose, Bld: 113 mg/dL — ABNORMAL HIGH (ref 65–99)
Potassium: 4.1 mmol/L (ref 3.5–5.1)
Sodium: 139 mmol/L (ref 135–145)
Total Bilirubin: 0.2 mg/dL — ABNORMAL LOW (ref 0.3–1.2)
Total Protein: 7 g/dL (ref 6.5–8.1)

## 2016-04-25 LAB — PROTIME-INR
INR: 2.54
Prothrombin Time: 27 seconds — ABNORMAL HIGH (ref 11.4–15.0)

## 2016-04-25 LAB — FERRITIN: Ferritin: 11 ng/mL (ref 11–307)

## 2016-04-25 NOTE — Telephone Encounter (Addendum)
Called pts granddaughter per Dr. She would like to know dosage of Hydroxyurea pt .  Grandaughter reports pt is on 4 pills hydroxyurea Wed, Sat and Sun.  3 pills Hydroxyurea on  Mon, Tues, Thurs, and Friday.

## 2016-04-25 NOTE — Telephone Encounter (Signed)
Erroneous note encounter  

## 2016-05-02 ENCOUNTER — Other Ambulatory Visit: Payer: Self-pay

## 2016-05-02 ENCOUNTER — Telehealth: Payer: Self-pay | Admitting: Hematology and Oncology

## 2016-05-02 ENCOUNTER — Inpatient Hospital Stay: Payer: Medicare Other

## 2016-05-02 ENCOUNTER — Other Ambulatory Visit: Payer: Self-pay | Admitting: *Deleted

## 2016-05-02 DIAGNOSIS — D45 Polycythemia vera: Secondary | ICD-10-CM

## 2016-05-02 DIAGNOSIS — I2699 Other pulmonary embolism without acute cor pulmonale: Secondary | ICD-10-CM

## 2016-05-02 DIAGNOSIS — I82409 Acute embolism and thrombosis of unspecified deep veins of unspecified lower extremity: Secondary | ICD-10-CM

## 2016-05-02 LAB — CBC WITH DIFFERENTIAL/PLATELET
Basophils Absolute: 0 10*3/uL (ref 0–0.1)
Basophils Relative: 1 %
Eosinophils Absolute: 0.1 10*3/uL (ref 0–0.7)
Eosinophils Relative: 3 %
HCT: 41.2 % (ref 35.0–47.0)
Hemoglobin: 13 g/dL (ref 12.0–16.0)
Lymphocytes Relative: 29 %
Lymphs Abs: 1.1 10*3/uL (ref 1.0–3.6)
MCH: 25.4 pg — ABNORMAL LOW (ref 26.0–34.0)
MCHC: 31.5 g/dL — ABNORMAL LOW (ref 32.0–36.0)
MCV: 80.8 fL (ref 80.0–100.0)
Monocytes Absolute: 0.1 10*3/uL — ABNORMAL LOW (ref 0.2–0.9)
Monocytes Relative: 3 %
Neutro Abs: 2.4 10*3/uL (ref 1.4–6.5)
Neutrophils Relative %: 64 %
Platelets: 922 10*3/uL (ref 150–400)
RBC: 5.11 MIL/uL (ref 3.80–5.20)
RDW: 29.7 % — ABNORMAL HIGH (ref 11.5–14.5)
WBC: 3.7 10*3/uL (ref 3.6–11.0)

## 2016-05-02 LAB — PROTIME-INR
INR: 1.67
Prothrombin Time: 19.7 seconds — ABNORMAL HIGH (ref 11.4–15.0)

## 2016-05-02 NOTE — Telephone Encounter (Signed)
Re:  Coumadin and Hydoxyurea  Per [name], patient taking Coumadin 4 mg 4 days a week and 5 mg 3 days a week (total weekly dose 31 mg). Patient takes 5 mg on Friday, Sat, and Sunday. INR 1.67.  Discussed taking Coumadin 5 mg 5 days a week and 4 mg 2 days a week (total weekly dose 33 mg). Patient to take 4 mg doses spread out during the week. Recheck INR in 1 week  Per [name], patient taking Hydroxyurea 4 tablets 3 days a week (Wed, Sat, Sun), and 3 tablets 4 days a week (total 24 tablets a week). Platelet count 922,000. New dose Hyroxyurea 4 tablets 6 days a week and 3 tablets 1 day a week (total 27 tablets a week)  Lequita Asal, MD

## 2016-05-05 ENCOUNTER — Inpatient Hospital Stay: Payer: Medicare Other | Admitting: Hematology and Oncology

## 2016-05-05 ENCOUNTER — Inpatient Hospital Stay: Payer: Medicare Other

## 2016-05-09 ENCOUNTER — Inpatient Hospital Stay: Payer: Medicare Other

## 2016-05-09 ENCOUNTER — Encounter: Payer: Self-pay | Admitting: Hematology and Oncology

## 2016-05-09 ENCOUNTER — Inpatient Hospital Stay (HOSPITAL_BASED_OUTPATIENT_CLINIC_OR_DEPARTMENT_OTHER): Payer: Medicare Other | Admitting: Hematology and Oncology

## 2016-05-09 ENCOUNTER — Other Ambulatory Visit: Payer: Self-pay

## 2016-05-09 ENCOUNTER — Telehealth: Payer: Self-pay

## 2016-05-09 VITALS — BP 177/95 | HR 69 | Temp 97.5°F | Wt 188.0 lb

## 2016-05-09 DIAGNOSIS — Z8673 Personal history of transient ischemic attack (TIA), and cerebral infarction without residual deficits: Secondary | ICD-10-CM

## 2016-05-09 DIAGNOSIS — D473 Essential (hemorrhagic) thrombocythemia: Secondary | ICD-10-CM | POA: Diagnosis not present

## 2016-05-09 DIAGNOSIS — Z79899 Other long term (current) drug therapy: Secondary | ICD-10-CM

## 2016-05-09 DIAGNOSIS — M1990 Unspecified osteoarthritis, unspecified site: Secondary | ICD-10-CM

## 2016-05-09 DIAGNOSIS — Z933 Colostomy status: Secondary | ICD-10-CM

## 2016-05-09 DIAGNOSIS — I10 Essential (primary) hypertension: Secondary | ICD-10-CM

## 2016-05-09 DIAGNOSIS — D45 Polycythemia vera: Secondary | ICD-10-CM

## 2016-05-09 DIAGNOSIS — Z7982 Long term (current) use of aspirin: Secondary | ICD-10-CM

## 2016-05-09 DIAGNOSIS — I82409 Acute embolism and thrombosis of unspecified deep veins of unspecified lower extremity: Secondary | ICD-10-CM

## 2016-05-09 DIAGNOSIS — I2782 Chronic pulmonary embolism: Secondary | ICD-10-CM

## 2016-05-09 DIAGNOSIS — Z86711 Personal history of pulmonary embolism: Secondary | ICD-10-CM | POA: Diagnosis not present

## 2016-05-09 DIAGNOSIS — D72829 Elevated white blood cell count, unspecified: Secondary | ICD-10-CM | POA: Diagnosis not present

## 2016-05-09 DIAGNOSIS — Z86718 Personal history of other venous thrombosis and embolism: Secondary | ICD-10-CM

## 2016-05-09 DIAGNOSIS — Z7901 Long term (current) use of anticoagulants: Secondary | ICD-10-CM

## 2016-05-09 LAB — CBC WITH DIFFERENTIAL/PLATELET
Basophils Absolute: 0 10*3/uL (ref 0–0.1)
Basophils Relative: 1 %
Eosinophils Absolute: 0.1 10*3/uL (ref 0–0.7)
Eosinophils Relative: 5 %
HCT: 42.3 % (ref 35.0–47.0)
Hemoglobin: 13.5 g/dL (ref 12.0–16.0)
Lymphocytes Relative: 35 %
Lymphs Abs: 1 10*3/uL (ref 1.0–3.6)
MCH: 26 pg (ref 26.0–34.0)
MCHC: 31.9 g/dL — ABNORMAL LOW (ref 32.0–36.0)
MCV: 81.3 fL (ref 80.0–100.0)
Monocytes Absolute: 0.1 10*3/uL — ABNORMAL LOW (ref 0.2–0.9)
Monocytes Relative: 4 %
Neutro Abs: 1.5 10*3/uL (ref 1.4–6.5)
Neutrophils Relative %: 55 %
Platelets: 709 10*3/uL — ABNORMAL HIGH (ref 150–440)
RBC: 5.21 MIL/uL — ABNORMAL HIGH (ref 3.80–5.20)
RDW: 28.7 % — ABNORMAL HIGH (ref 11.5–14.5)
WBC: 2.7 10*3/uL — ABNORMAL LOW (ref 3.6–11.0)

## 2016-05-09 LAB — COMPREHENSIVE METABOLIC PANEL
ALT: 9 U/L — ABNORMAL LOW (ref 14–54)
AST: 15 U/L (ref 15–41)
Albumin: 3.2 g/dL — ABNORMAL LOW (ref 3.5–5.0)
Alkaline Phosphatase: 75 U/L (ref 38–126)
Anion gap: 4 — ABNORMAL LOW (ref 5–15)
BUN: 12 mg/dL (ref 6–20)
CO2: 28 mmol/L (ref 22–32)
Calcium: 8.9 mg/dL (ref 8.9–10.3)
Chloride: 106 mmol/L (ref 101–111)
Creatinine, Ser: 0.49 mg/dL (ref 0.44–1.00)
GFR calc Af Amer: >60 mL/min (ref >60)
GFR calc non Af Amer: >60 mL/min (ref >60)
Glucose, Bld: 99 mg/dL (ref 65–99)
Potassium: 4.3 mmol/L (ref 3.5–5.1)
Sodium: 138 mmol/L (ref 135–145)
Total Bilirubin: 0.6 mg/dL (ref 0.3–1.2)
Total Protein: 7.3 g/dL (ref 6.5–8.1)

## 2016-05-09 LAB — PROTIME-INR
INR: 1.55
Prothrombin Time: 18.6 seconds — ABNORMAL HIGH (ref 11.4–15.0)

## 2016-05-09 LAB — FERRITIN: Ferritin: 10 ng/mL — ABNORMAL LOW (ref 11–307)

## 2016-05-09 NOTE — Progress Notes (Signed)
[facility] day:  05/09/2016  Chief Complaint: Yolanda Wells is a 82 y.o. female with polycythemia rubra vera (PV) who is seen for 1 month assessment on hydroxyurea and Coumadin and possible phlebotomy.  HPI:   The patient was last seen in the medical oncology clinic on 04/04/2016.  At that time, she felt good.  Exam revealed no hepatosplenomegaly.  INR was 2.68.  She continued Coumadin 5 mg 4 days/week and 6 mg 3 day/week (total weekly dose: 38 mg).  CBC revealed a hematocrit of 39.1, hemoglobin 12.7, platelets 745,000, and WBC 5000 with an ANC of 3500.  As her platelets were > 400,000, her hydroxyurea was increased to 4 pills 3 days/week and 3 pills 4 days/week (24 pills/week).  She has continued to have close follow-up of her counts.  INR was 2.66, 3.53, and 2.54.  Current Coumadin dose is 5 mg 5 days a week and 4 mg 2 days a week (total weekly dose 33 mg).  CBC last week revealed a platelet count of 922,000.  Hydroxyurea was increased to 4 tablets 6 days a week and 3 tablets 1 day a week (total 27 tablets a week).  Symptomatically, she feels good.  She is eating well.  She denies any bruising or bleeding.  She denies any infections.  She is planning a trip to [place].   Past Medical History  Diagnosis Date  . Hypertension   . Deep venous thrombosis (HCC)     right lower extremity  . History of hysterectomy   . History of bilateral hip replacements   . Polycythemia vera ([place])   . Dependent edema   . Arthritis   . Collagen vascular disease ([place])   . Stroke [facility])     Pt reports she was told she had a stroke in may    Past Surgical History  Procedure Laterality Date  . Toe amputation      right  . Joint replacement      Left and Right Hip  . Abdominal hysterectomy    . Peripheral vascular catheterization N/A 04/02/2015    Procedure: Visceral Angiography;  Surgeon: Algernon Huxley, MD;  Location: [facility];  Service:  Cardiovascular;  Laterality: N/A;  . Peripheral vascular catheterization N/A 04/02/2015    Procedure: Visceral Artery Intervention;  Surgeon: Algernon Huxley, MD;  Location: [facility];  Service: Cardiovascular;  Laterality: N/A;  . Colectomy with colostomy creation/hartmann procedure N/A 04/03/2015    Procedure: COLECTOMY WITH COLOSTOMY CREATION/HARTMANN PROCEDURE;  Surgeon: Sherri Rad, MD;  Location: [facility];  Service: General;  Laterality: N/A;  . Cardiac catheterization Right 04/03/2015    Procedure: CENTRAL LINE INSERTION;  Surgeon: Sherri Rad, MD;  Location: [facility];  Service: General;  Laterality: Right;  . Peripheral vascular catheterization N/A 05/25/2015    Procedure: IVC Filter Insertion;  Surgeon: Katha Cabal, MD;  Location: [facility];  Service: Cardiovascular;  Laterality: N/A;    Family History  Problem Relation Age of Onset  . Heart attack Father   . Hypertension    . Arthritis-Osteo    . COPD Father   . Diabetes Brother   . Osteosarcoma Son     Social History:  reports that she has never smoked. She has never used smokeless tobacco. She reports that she does not drink alcohol or use illicit drugs.  She lives alone on [address] at [facility] (independent living). Her grand-daughter visits regularly (  [name] [phone]).  The patient is accompanied by her grand-daughter today.  Allergies: No Known Allergies  Current Medications: Current Outpatient Prescriptions  Medication Sig Dispense Refill  . aspirin EC 81 MG tablet Take 1 tablet (81 mg total) by mouth daily. 30 tablet 2  . docusate sodium (COLACE) 100 MG capsule Take 100 mg by mouth 2 (two) times daily as needed for mild constipation.     . folic acid (FOLVITE) 824 MCG tablet Take 400 mcg by mouth daily.    . hydroxyurea (HYDREA) 500 MG capsule Pt takes 4 capsules on Wed, Saturday, and Sunday,take 3 capsules on Monday, Tues Thurs, and Friday. 96 capsule 3  . menthol-cetylpyridinium  (CEPACOL) 3 MG lozenge Take 1 lozenge (3 mg total) by mouth as needed for sore throat. 100 tablet 12  . polyethylene glycol powder (GLYCOLAX/MIRALAX) powder     . potassium chloride SA (K-DUR,KLOR-CON) 20 MEQ tablet Take 1 tablet (20 mEq total) by mouth 2 (two) times daily. 30 tablet 0  . warfarin (COUMADIN) 1 MG tablet Take 38m on Mon, Wed, Fri, and Sun.  Take 6 mg on Tues, Thurs, Sat 20 tablet 3  . warfarin (COUMADIN) 3 MG tablet Pt to take 575mon Mon, Wed, Fri, and Sun.  Then 6 mg tues, Thurs, Sat. 45 tablet 3  . ipratropium-albuterol (DUONEB) 0.5-2.5 (3) MG/3ML SOLN Take 3 mLs by nebulization every 4 (four) hours. (Patient taking differently: Take 3 mLs by nebulization every 4 (four) hours as needed (for wheezing/shortness of breath). ) 360 mL 6  . Polyethylene Glycol POWD Take 17 g by mouth daily. 527 g 5   No current facility-administered medications for this visit.    Review of Systems:  GENERAL:  Feels good.  No fevers or sweats.  Weight stable. PERFORMANCE STATUS (ECOG):  3 HEENT:  No visual changes, runny nose, sore throat, mouth sores or tenderness. Lungs:  No shortness of breath or cough.  No hemoptysis. Cardiac:  No chest pain, palpitations, orthopnea, or PND. GI:  No nausea, vomiting, diarrhea, constipation, melena or hematochezia. GU:  No urgency, frequency, dysuria, or hematuria. Musculoskeletal:  Stiffness.  Knee problems ("bone on bone").  No muscle tenderness. Extremities:  Chronic swelling in legs. Skin:  Dry skin.  No rashes or skin changes. Neuro:  No headache, numbness or weakness, balance or coordination issues. Endocrine:  No diabetes, thyroid issues, hot flashes or night sweats. Psych:  No mood changes, depression or anxiety. Pain:  No focal pain. Review of systems:  All other systems reviewed and found to be negative.  Physical Exam: Blood pressure 177/95, pulse 69, temperature 97.5 F (36.4 C), temperature source Tympanic, weight 188 lb (85.276 kg). GENERAL:   Elderly woman sitting comfortably in a wheelchair in the exam room in no acute distress. MENTAL STATUS:  Alert and oriented to person, place and time. HEAD: [place]line [place].  Normocephalic, atraumatic, face symmetric, no Cushingoid features. EYES:  Brown eyes.  Pupils equal round and reactive to light and accomodation.  No conjunctivitis or scleral icterus. ENT:  Oropharynx clear without lesion.  Upper dentures.  Tongue normal. Mucous membranes moist.  RESPIRATORY:  Clear to auscultation without rales, wheezes or rhonchi. CARDIOVASCULAR:  Regular rate and rhythm without murmur, rub or gallop. ABDOMEN:  Left sided colostomy.  Soft, non-tender, with active bowel sounds, and no appreciable hepatosplenomegaly.  No masses. SKIN:  No rashes, ulcers or lesions. EXTREMITIES:  No edema, skin discoloration or tenderness.  No palpable cords. LYMPH NODES: No palpable  cervical, supraclavicular, axillary or inguinal adenopathy  NEUROLOGICAL: Unremarkable. PSYCH:  Appropriate.  Appointment on 05/09/2016  Component Date Value Ref Range Status  . WBC 05/09/2016 2.7* 3.6 - 11.0 K/uL Final  . RBC 05/09/2016 5.21* 3.80 - 5.20 MIL/uL Final  . Hemoglobin 05/09/2016 13.5  12.0 - 16.0 g/dL Final  . HCT 05/09/2016 42.3  35.0 - 47.0 % Final  . MCV 05/09/2016 81.3  80.0 - 100.0 fL Final  . MCH 05/09/2016 26.0  26.0 - 34.0 pg Final  . MCHC 05/09/2016 31.9* 32.0 - 36.0 g/dL Final  . RDW 05/09/2016 28.7* 11.5 - 14.5 % Final  . Platelets 05/09/2016 709* 150 - 440 K/uL Final  . Neutrophils Relative % 05/09/2016 55   Final  . Neutro Abs 05/09/2016 1.5  1.4 - 6.5 K/uL Final  . Lymphocytes Relative 05/09/2016 35   Final  . Lymphs Abs 05/09/2016 1.0  1.0 - 3.6 K/uL Final  . Monocytes Relative 05/09/2016 4   Final  . Monocytes Absolute 05/09/2016 0.1* 0.2 - 0.9 K/uL Final  . Eosinophils Relative 05/09/2016 5   Final  . Eosinophils Absolute 05/09/2016 0.1  0 - 0.7 K/uL Final  . Basophils Relative 05/09/2016 1   Final  .  Basophils Absolute 05/09/2016 0.0  0 - 0.1 K/uL Final  . Sodium 05/09/2016 138  135 - 145 mmol/L Final  . Potassium 05/09/2016 4.3  3.5 - 5.1 mmol/L Final  . Chloride 05/09/2016 106  101 - 111 mmol/L Final  . CO2 05/09/2016 28  22 - 32 mmol/L Final  . Glucose, Bld 05/09/2016 99  65 - 99 mg/dL Final  . BUN 05/09/2016 12  6 - 20 mg/dL Final  . Creatinine, Ser 05/09/2016 0.49  0.44 - 1.00 mg/dL Final  . Calcium 05/09/2016 8.9  8.9 - 10.3 mg/dL Final  . Total Protein 05/09/2016 7.3  6.5 - 8.1 g/dL Final  . Albumin 05/09/2016 3.2* 3.5 - 5.0 g/dL Final  . AST 05/09/2016 15  15 - 41 U/L Final  . ALT 05/09/2016 9* 14 - 54 U/L Final  . Alkaline Phosphatase 05/09/2016 75  38 - 126 U/L Final  . Total Bilirubin 05/09/2016 0.6  0.3 - 1.2 mg/dL Final  . GFR calc non Af Amer 05/09/2016 >60  >60 mL/min Final  . GFR calc Af Amer 05/09/2016 >60  >60 mL/min Final   Comment: (NOTE) The eGFR has been calculated using the CKD EPI equation. This calculation has not been validated in all clinical situations. eGFR's persistently <60 mL/min signify possible Chronic Kidney Disease.   . Anion gap 05/09/2016 4* 5 - 15 Final    Assessment:  Yolanda Wells is a 82 y.o. female African-American woman with JAK2+ polycythemia rubra vera (PV) previously on a phlebotomy program and hydroxyurea. She received P32 in an attempt to manage her counts in 03/2015.   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 She was diagnosed with bilateral lower extremity DVTs on 05/18/2015 and bilateral pulmonary emboli on 05/24/2015. She underwent IVC filter placement on 05/25/2015.  She is on a fluctuating dose of Coumadin (total weekly dose 38 mg).  She is on a baby aspirin.  She developed progressive erythrocytosis, thrombocytosis, and leukocytosis.  She underwent phlebotomy for a hematocrit of 55.4 on 09/23/2015.  Hematocrit  decreased to 50.0, but has again increased after initiation of oral iron.  Platelet count has increased from 1.1 million to 1.4 million.  White count increased from 23,000 - 28,000 to 31,800.  She has been back on hydroxyurea since 10/02/2015.  Initial dose was 1000 mg a day.  She is currently taking 4 pills 6 days a week and 3 pills 1 day/week (total weekly dose: 27 pills).   She requires periodic phlebotomies (goal hematocrit 42).  Platelet count remains elevated (secondary to PV and likely some component of iron deficiency).  Goal platelet count is 400,000.  Hematocrit and ANC are adequate.  Platelet count is 709,000 (improving).  Ferritin is 10 today.  Symptomatically, she feels good.  Exam reveals no appreciable hepatosplenomegaly.  INR is 1.55 on Coumadin 5 mg 5 days a week and 4 mg 2 days a week (total weekly dose 33 mg).  Plan: 1.  Labs today:  CBC with diff, CMP, PT/INR, ferritin. 2.  No phlebotomy today.  Hematocrit is at goal.  3.  Continue current dose of hydroxyurea. 4.  Continue baby aspirin. 5.  Increase Coumadin to 5 mg 6 days/week and 6 mg 1 day/week (total weekly dose: 36 mg). 6.  Anticipate decreasing frequency of visits if INR stable and does not require phlebotomy. 7.  RTC weekly for CBC with diff and PT/INR.  Decrease frequency if stable. 8.  RTC in 4 weeks for MD assess, labs (CBC with diff, CMP, PT/INR) +/- phlebotomy.   Lequita Asal, MD  05/09/2016

## 2016-05-09 NOTE — Telephone Encounter (Signed)
Called and spoke with pt's granddaughter regarding coumadin dosing. Per Dr. Mike Gip. Pt's new dose 5 mg daily on 6 days. 6 mg on 1 day. Pt's grandaughter verbalized an understanding. And stated that the old coumadin dosage was 4 mg on 2 days, and 5 mg on 5 days. No other concerns noted.

## 2016-05-12 ENCOUNTER — Other Ambulatory Visit: Payer: Self-pay | Admitting: Hematology and Oncology

## 2016-05-16 ENCOUNTER — Telehealth: Payer: Self-pay

## 2016-05-16 ENCOUNTER — Inpatient Hospital Stay: Payer: Medicare Other | Attending: Hematology and Oncology

## 2016-05-16 DIAGNOSIS — Z86711 Personal history of pulmonary embolism: Secondary | ICD-10-CM | POA: Diagnosis not present

## 2016-05-16 DIAGNOSIS — Z5181 Encounter for therapeutic drug level monitoring: Secondary | ICD-10-CM | POA: Diagnosis not present

## 2016-05-16 DIAGNOSIS — D45 Polycythemia vera: Secondary | ICD-10-CM | POA: Insufficient documentation

## 2016-05-16 DIAGNOSIS — D473 Essential (hemorrhagic) thrombocythemia: Secondary | ICD-10-CM | POA: Diagnosis not present

## 2016-05-16 DIAGNOSIS — Z86718 Personal history of other venous thrombosis and embolism: Secondary | ICD-10-CM | POA: Diagnosis not present

## 2016-05-16 DIAGNOSIS — Z7901 Long term (current) use of anticoagulants: Secondary | ICD-10-CM | POA: Insufficient documentation

## 2016-05-16 DIAGNOSIS — D72829 Elevated white blood cell count, unspecified: Secondary | ICD-10-CM | POA: Insufficient documentation

## 2016-05-16 LAB — CBC WITH DIFFERENTIAL/PLATELET
Basophils Absolute: 0 10*3/uL (ref 0–0.1)
Basophils Relative: 1 %
Eosinophils Absolute: 0.1 10*3/uL (ref 0–0.7)
Eosinophils Relative: 2 %
HCT: 40.1 % (ref 35.0–47.0)
Hemoglobin: 13 g/dL (ref 12.0–16.0)
Lymphocytes Relative: 35 %
Lymphs Abs: 0.9 10*3/uL — ABNORMAL LOW (ref 1.0–3.6)
MCH: 26.3 pg (ref 26.0–34.0)
MCHC: 32.4 g/dL (ref 32.0–36.0)
MCV: 81.3 fL (ref 80.0–100.0)
Monocytes Absolute: 0.1 10*3/uL — ABNORMAL LOW (ref 0.2–0.9)
Monocytes Relative: 3 %
Neutro Abs: 1.6 10*3/uL (ref 1.4–6.5)
Neutrophils Relative %: 59 %
Platelets: 636 10*3/uL — ABNORMAL HIGH (ref 150–440)
RBC: 4.93 MIL/uL (ref 3.80–5.20)
RDW: 30.5 % — ABNORMAL HIGH (ref 11.5–14.5)
WBC: 2.7 10*3/uL — ABNORMAL LOW (ref 3.6–11.0)

## 2016-05-16 LAB — PROTIME-INR
INR: 2.4
Prothrombin Time: 25.9 seconds — ABNORMAL HIGH (ref 11.4–15.0)

## 2016-05-16 NOTE — Telephone Encounter (Signed)
Called pt's granddaughter left VM that pt's INR is good and to call clinic if they needed anything.

## 2016-05-23 ENCOUNTER — Inpatient Hospital Stay: Payer: Medicare Other

## 2016-05-23 DIAGNOSIS — D45 Polycythemia vera: Secondary | ICD-10-CM | POA: Diagnosis not present

## 2016-05-23 LAB — CBC WITH DIFFERENTIAL/PLATELET
Basophils Absolute: 0 10*3/uL (ref 0–0.1)
Basophils Relative: 1 %
Eosinophils Absolute: 0.1 10*3/uL (ref 0–0.7)
Eosinophils Relative: 2 %
HCT: 38.2 % (ref 35.0–47.0)
Hemoglobin: 12.5 g/dL (ref 12.0–16.0)
Lymphocytes Relative: 31 %
Lymphs Abs: 0.7 10*3/uL — ABNORMAL LOW (ref 1.0–3.6)
MCH: 26.9 pg (ref 26.0–34.0)
MCHC: 32.8 g/dL (ref 32.0–36.0)
MCV: 82.2 fL (ref 80.0–100.0)
Monocytes Absolute: 0.1 10*3/uL — ABNORMAL LOW (ref 0.2–0.9)
Monocytes Relative: 4 %
Neutro Abs: 1.4 10*3/uL (ref 1.4–6.5)
Neutrophils Relative %: 62 %
Platelets: 445 10*3/uL — ABNORMAL HIGH (ref 150–440)
RBC: 4.65 MIL/uL (ref 3.80–5.20)
RDW: 29.7 % — ABNORMAL HIGH (ref 11.5–14.5)
WBC: 2.3 10*3/uL — ABNORMAL LOW (ref 3.6–11.0)

## 2016-05-23 LAB — PROTIME-INR
INR: 1.86
Prothrombin Time: 21.4 seconds — ABNORMAL HIGH (ref 11.4–15.0)

## 2016-05-26 ENCOUNTER — Telehealth: Payer: Self-pay

## 2016-05-26 NOTE — Telephone Encounter (Signed)
-----   Message from Lequita Asal, MD sent at 05/26/2016 11:15 AM EDT ----- Regarding: Please call patient  Please find out patient's dosing of Coumadin. INR is 1.9 (close)  Last time we increased her dose, follow-up INR was high. I plan on going up slightly (5%) rather than the standard (10%).  M  ----- Message -----    From: Lab In [place] Interface    Sent: 05/23/2016  11:09 AM      To: Lequita Asal, MD

## 2016-05-26 NOTE — Telephone Encounter (Signed)
Spoke with Yolanda Wells patient is taking her coumadin 5mg : Monday,Wednesday,Friday and Sunday and she is taking the 6mg : Tuesday,Thursday and Saturday

## 2016-05-30 ENCOUNTER — Inpatient Hospital Stay: Payer: Medicare Other

## 2016-05-30 ENCOUNTER — Telehealth: Payer: Self-pay | Admitting: *Deleted

## 2016-05-30 DIAGNOSIS — D45 Polycythemia vera: Secondary | ICD-10-CM | POA: Diagnosis not present

## 2016-05-30 LAB — CBC WITH DIFFERENTIAL/PLATELET
Basophils Absolute: 0 10*3/uL (ref 0–0.1)
Basophils Relative: 1 %
Eosinophils Absolute: 0.1 10*3/uL (ref 0–0.7)
Eosinophils Relative: 2 %
HCT: 40.6 % (ref 35.0–47.0)
Hemoglobin: 13.3 g/dL (ref 12.0–16.0)
Lymphocytes Relative: 29 %
Lymphs Abs: 0.9 10*3/uL — ABNORMAL LOW (ref 1.0–3.6)
MCH: 27 pg (ref 26.0–34.0)
MCHC: 32.7 g/dL (ref 32.0–36.0)
MCV: 82.6 fL (ref 80.0–100.0)
Monocytes Absolute: 0.1 10*3/uL — ABNORMAL LOW (ref 0.2–0.9)
Monocytes Relative: 4 %
Neutro Abs: 1.9 10*3/uL (ref 1.4–6.5)
Neutrophils Relative %: 64 %
Platelets: 664 10*3/uL — ABNORMAL HIGH (ref 150–440)
RBC: 4.92 MIL/uL (ref 3.80–5.20)
RDW: 27.6 % — ABNORMAL HIGH (ref 11.5–14.5)
WBC: 3 10*3/uL — ABNORMAL LOW (ref 3.6–11.0)

## 2016-05-30 LAB — PROTIME-INR
INR: 1.74
Prothrombin Time: 20.3 seconds — ABNORMAL HIGH (ref 11.4–15.0)

## 2016-05-30 NOTE — Telephone Encounter (Signed)
Called to check with granddaughter caring for pt on what dose of coumadin she is taking and she gets up and goes to her pill box and says she is taking 5 mg 5 days a week and thn 4 mg 2 days a week which is tues, and Sat.   After speaking to [place] she would like her to inc. To 5 mg 6 days a week and on fridays take 6 mg.  When I called [name] I got her voicemail and I had to leave message about the changes but asked her to call me because I did not know what combinations of coumadin doses she has to make 6 mg and if she needs refills.  I have left my number for her to call me back

## 2016-06-06 ENCOUNTER — Ambulatory Visit: Payer: Medicare Other | Admitting: Hematology and Oncology

## 2016-06-06 ENCOUNTER — Other Ambulatory Visit: Payer: Medicare Other

## 2016-06-10 ENCOUNTER — Other Ambulatory Visit: Payer: Self-pay

## 2016-06-10 ENCOUNTER — Telehealth: Payer: Self-pay | Admitting: *Deleted

## 2016-06-10 ENCOUNTER — Inpatient Hospital Stay: Payer: Medicare Other | Attending: Hematology and Oncology

## 2016-06-10 DIAGNOSIS — Z86718 Personal history of other venous thrombosis and embolism: Secondary | ICD-10-CM | POA: Diagnosis not present

## 2016-06-10 DIAGNOSIS — Z8673 Personal history of transient ischemic attack (TIA), and cerebral infarction without residual deficits: Secondary | ICD-10-CM | POA: Diagnosis not present

## 2016-06-10 DIAGNOSIS — Z79899 Other long term (current) drug therapy: Secondary | ICD-10-CM | POA: Diagnosis not present

## 2016-06-10 DIAGNOSIS — Z7901 Long term (current) use of anticoagulants: Secondary | ICD-10-CM | POA: Diagnosis not present

## 2016-06-10 DIAGNOSIS — M1990 Unspecified osteoarthritis, unspecified site: Secondary | ICD-10-CM | POA: Insufficient documentation

## 2016-06-10 DIAGNOSIS — D45 Polycythemia vera: Secondary | ICD-10-CM | POA: Diagnosis not present

## 2016-06-10 DIAGNOSIS — I739 Peripheral vascular disease, unspecified: Secondary | ICD-10-CM | POA: Diagnosis not present

## 2016-06-10 DIAGNOSIS — I10 Essential (primary) hypertension: Secondary | ICD-10-CM | POA: Insufficient documentation

## 2016-06-10 DIAGNOSIS — Z7982 Long term (current) use of aspirin: Secondary | ICD-10-CM | POA: Diagnosis not present

## 2016-06-10 LAB — COMPREHENSIVE METABOLIC PANEL
ALT: 11 U/L — ABNORMAL LOW (ref 14–54)
AST: 20 U/L (ref 15–41)
Albumin: 3 g/dL — ABNORMAL LOW (ref 3.5–5.0)
Alkaline Phosphatase: 81 U/L (ref 38–126)
Anion gap: 5 (ref 5–15)
BUN: 11 mg/dL (ref 6–20)
CO2: 25 mmol/L (ref 22–32)
Calcium: 8.4 mg/dL — ABNORMAL LOW (ref 8.9–10.3)
Chloride: 104 mmol/L (ref 101–111)
Creatinine, Ser: 0.63 mg/dL (ref 0.44–1.00)
GFR calc Af Amer: >60 mL/min (ref >60)
GFR calc non Af Amer: >60 mL/min (ref >60)
Glucose, Bld: 117 mg/dL — ABNORMAL HIGH (ref 65–99)
Potassium: 4.2 mmol/L (ref 3.5–5.1)
Sodium: 134 mmol/L — ABNORMAL LOW (ref 135–145)
Total Bilirubin: 0.6 mg/dL (ref 0.3–1.2)
Total Protein: 7.4 g/dL (ref 6.5–8.1)

## 2016-06-10 LAB — CBC WITH DIFFERENTIAL/PLATELET
Basophils Absolute: 0 10*3/uL (ref 0–0.1)
Basophils Relative: 2 %
Eosinophils Absolute: 0.1 10*3/uL (ref 0–0.7)
Eosinophils Relative: 3 %
HCT: 40 % (ref 35.0–47.0)
Hemoglobin: 12.9 g/dL (ref 12.0–16.0)
Lymphocytes Relative: 38 %
Lymphs Abs: 0.9 10*3/uL — ABNORMAL LOW (ref 1.0–3.6)
MCH: 27.2 pg (ref 26.0–34.0)
MCHC: 32.2 g/dL (ref 32.0–36.0)
MCV: 84.3 fL (ref 80.0–100.0)
Monocytes Absolute: 0.1 10*3/uL — ABNORMAL LOW (ref 0.2–0.9)
Monocytes Relative: 5 %
Neutro Abs: 1.2 10*3/uL — ABNORMAL LOW (ref 1.4–6.5)
Neutrophils Relative %: 52 %
Platelets: 608 10*3/uL — ABNORMAL HIGH (ref 150–440)
RBC: 4.74 MIL/uL (ref 3.80–5.20)
RDW: 29.2 % — ABNORMAL HIGH (ref 11.5–14.5)
WBC: 2.3 10*3/uL — ABNORMAL LOW (ref 3.6–11.0)

## 2016-06-10 LAB — PROTIME-INR
INR: 2.38
Prothrombin Time: 26.4 seconds — ABNORMAL HIGH (ref 11.4–15.2)

## 2016-06-10 NOTE — Telephone Encounter (Signed)
-----   Message from Lequita Asal, MD sent at 06/10/2016  1:11 PM EDT ----- Regarding: Please call patient with INR and CBC  Labs look good.  No change in Coumadin dose :)  Keep Hydrea dose the same.   WBC borderline low.  Platelet count coming down.  M  ----- Message ----- From: Interface, Lab In [place] Sent: 06/10/2016  11:28 AM To: Lequita Asal, MD

## 2016-06-10 NOTE — Telephone Encounter (Signed)
Called and got Yolanda Wells voicemail and I left her a message that all the labs were good and pt can stay on same coumadin dose of 5 mg 6 days a week and 6 mg on Friday.  Stay on same hydrea dose which is 4 pills 5 days a week and 3 pills on 2 days a week. If any questions please call

## 2016-06-13 ENCOUNTER — Other Ambulatory Visit: Payer: Self-pay

## 2016-06-13 DIAGNOSIS — D45 Polycythemia vera: Secondary | ICD-10-CM

## 2016-06-14 NOTE — Progress Notes (Deleted)
[facility] day:  06/14/2016   Chief Complaint: Chanette Demo is a 82 y.o. female with polycythemia rubra vera (PV) who is seen for 2 month assessment on hydroxyurea and Coumadin and possible phlebotomy.  HPI:   The patient was last seen in the medical oncology clinic on 05/09/2016.  At that time, she felt good.  Exam revealed no appreciable hepatosplenomegaly.  INR was 1.55 on Coumadin 5 mg 5 days a week and 4 mg 2 days a week (total weekly dose 33 mg).  She has had follow-up counts.  Last counts on 06/10/2016 revealed a hematocrit of 40.0, hemoglobin 12.9, MCV 84.3, platelets 608,000, WBC 2300 with an ANC of 1200.  Decision was made to continue her current dose of hydroxyurea, as her platelet count was decreasing (prior 664,00 on 05/30/2016).  She has been taking hydroxyurea 4 pills 5 days a week and 3 pills 2 days per week (26 pills/week).  INR was 2.38 on 06/10/2016.  Coumadin dose remained at 5 mg 6 days a week and 6 mg on Fridays (total weekly dose 36 mg).  Symptomatically, she feels good.  She is eating well.  She denies any bruising or bleeding.  She denies any infections.  She is planning a trip to [place].   Past Medical History:  Diagnosis Date  . Arthritis   . Collagen vascular disease ([place])   . Deep venous thrombosis (HCC)    right lower extremity  . Dependent edema   . History of bilateral hip replacements   . History of hysterectomy   . Hypertension   . Polycythemia vera ([place])   . Stroke [facility])    Pt reports she was told she had a stroke in may    Past Surgical History:  Procedure Laterality Date  . ABDOMINAL HYSTERECTOMY    . CARDIAC CATHETERIZATION Right 04/03/2015   Procedure: CENTRAL LINE INSERTION;  Surgeon: Sherri Rad, MD;  Location: [facility] ORS;  Service: General;  Laterality: Right;  . COLECTOMY WITH COLOSTOMY CREATION/HARTMANN PROCEDURE N/A 04/03/2015   Procedure: COLECTOMY WITH COLOSTOMY CREATION/HARTMANN PROCEDURE;   Surgeon: Sherri Rad, MD;  Location: [facility] ORS;  Service: General;  Laterality: N/A;  . JOINT REPLACEMENT     Left and Right Hip  . PERIPHERAL VASCULAR CATHETERIZATION N/A 04/02/2015   Procedure: Visceral Angiography;  Surgeon: Algernon Huxley, MD;  Location: [place] [facility];  Service: Cardiovascular;  Laterality: N/A;  . PERIPHERAL VASCULAR CATHETERIZATION N/A 04/02/2015   Procedure: Visceral Artery Intervention;  Surgeon: Algernon Huxley, MD;  Location: [place] [facility];  Service: Cardiovascular;  Laterality: N/A;  . PERIPHERAL VASCULAR CATHETERIZATION N/A 05/25/2015   Procedure: IVC Filter Insertion;  Surgeon: Katha Cabal, MD;  Location: [place] [facility];  Service: Cardiovascular;  Laterality: N/A;  . TOE AMPUTATION     right    Family History  Problem Relation Age of Onset  . Heart attack Father   . Hypertension    . Arthritis-Osteo    . COPD Father   . Diabetes Brother   . Osteosarcoma Son     Social History:  reports that she has never smoked. She has never used smokeless tobacco. She reports that she does not drink alcohol or use drugs.  She lives alone on [address] at [facility] (independent living). Her grand-daughter visits regularly [name] [phone]).  The patient is accompanied by her grand-daughter today.  Allergies: No Known Allergies  Current Medications: Current Outpatient Prescriptions  Medication Sig Dispense Refill  . aspirin EC 81 MG tablet Take 1 tablet (81 mg total) by mouth daily. 30 tablet 2  . docusate sodium (COLACE) 100 MG capsule Take 100 mg by mouth 2 (two) times daily as needed for mild constipation.     . folic acid (FOLVITE) 712 MCG tablet Take 400 mcg by mouth daily.    . hydroxyurea (HYDREA) 500 MG capsule Pt takes 4 capsules on Wed, Saturday, and Sunday,take 3 capsules on Monday, Tues Thurs, and Friday. 96 capsule 3  . hydroxyurea (HYDREA) 500 MG capsule Take 4 capsules 5 days a week and 3 capsules 2 days a week 30 capsule 3  .  ipratropium-albuterol (DUONEB) 0.5-2.5 (3) MG/3ML SOLN Take 3 mLs by nebulization every 4 (four) hours. (Patient taking differently: Take 3 mLs by nebulization every 4 (four) hours as needed (for wheezing/shortness of breath). ) 360 mL 6  . menthol-cetylpyridinium (CEPACOL) 3 MG lozenge Take 1 lozenge (3 mg total) by mouth as needed for sore throat. 100 tablet 12  . Polyethylene Glycol POWD Take 17 g by mouth daily. 527 g 5  . polyethylene glycol powder (GLYCOLAX/MIRALAX) powder     . potassium chloride SA (K-DUR,KLOR-CON) 20 MEQ tablet Take 1 tablet (20 mEq total) by mouth 2 (two) times daily. 30 tablet 0  . warfarin (COUMADIN) 1 MG tablet Take 96m on Mon, Wed, Fri, and Sun.  Take 6 mg on Tues, Thurs, Sat 20 tablet 3  . warfarin (COUMADIN) 3 MG tablet Pt to take 533mon Mon, Wed, Fri, and Sun.  Then 6 mg tues, Thurs, Sat. 45 tablet 3   No current facility-administered medications for this visit.     Review of Systems:  GENERAL:  Feels good.  No fevers or sweats.  Weight stable. PERFORMANCE STATUS (ECOG):  3 HEENT:  No visual changes, runny nose, sore throat, mouth sores or tenderness. Lungs:  No shortness of breath or cough.  No hemoptysis. Cardiac:  No chest pain, palpitations, orthopnea, or PND. GI:  No nausea, vomiting, diarrhea, constipation, melena or hematochezia. GU:  No urgency, frequency, dysuria, or hematuria. Musculoskeletal:  Stiffness.  Knee problems ("bone on bone").  No muscle tenderness. Extremities:  Chronic swelling in legs. Skin:  Dry skin.  No rashes or skin changes. Neuro:  No headache, numbness or weakness, balance or coordination issues. Endocrine:  No diabetes, thyroid issues, hot flashes or night sweats. Psych:  No mood changes, depression or anxiety. Pain:  No focal pain. Review of systems:  All other systems reviewed and found to be negative.  Physical Exam: There were no vitals taken for this visit. GENERAL:  Elderly woman sitting comfortably in a wheelchair  in the exam room in no acute distress. MENTAL STATUS:  Alert and oriented to person, place and time. HEAD: GrPearline Cablesair.  Normocephalic, atraumatic, face symmetric, no Cushingoid features. EYES:  Brown eyes.  Pupils equal round and reactive to light and accomodation.  No conjunctivitis or scleral icterus. ENT:  Oropharynx clear without lesion.  Upper dentures.  Tongue normal. Mucous membranes moist.  RESPIRATORY:  Clear to auscultation without rales, wheezes or rhonchi. CARDIOVASCULAR:  Regular rate and rhythm without murmur, rub or gallop. ABDOMEN:  Left sided colostomy.  Soft, non-tender, with active bowel sounds, and no appreciable hepatosplenomegaly.  No masses. SKIN:  No rashes, ulcers or lesions. EXTREMITIES:  No edema, skin discoloration or tenderness.  No palpable cords. LYMPH NODES: No palpable cervical, supraclavicular, axillary or inguinal adenopathy  NEUROLOGICAL: Unremarkable.  PSYCH:  Appropriate.  No visits with results within 3 Day(s) from this visit.  Latest known visit with results is:  Orders Only on 06/10/2016  Component Date Value Ref Range Status  . WBC 06/10/2016 2.3* 3.6 - 11.0 K/uL Final  . RBC 06/10/2016 4.74  3.80 - 5.20 MIL/uL Final  . Hemoglobin 06/10/2016 12.9  12.0 - 16.0 g/dL Final  . HCT 06/10/2016 40.0  35.0 - 47.0 % Final  . MCV 06/10/2016 84.3  80.0 - 100.0 fL Final  . MCH 06/10/2016 27.2  26.0 - 34.0 pg Final  . MCHC 06/10/2016 32.2  32.0 - 36.0 g/dL Final  . RDW 06/10/2016 29.2* 11.5 - 14.5 % Final  . Platelets 06/10/2016 608* 150 - 440 K/uL Final  . Neutrophils Relative % 06/10/2016 52  % Final  . Neutro Abs 06/10/2016 1.2* 1.4 - 6.5 K/uL Final  . Lymphocytes Relative 06/10/2016 38  % Final  . Lymphs Abs 06/10/2016 0.9* 1.0 - 3.6 K/uL Final  . Monocytes Relative 06/10/2016 5  % Final  . Monocytes Absolute 06/10/2016 0.1* 0.2 - 0.9 K/uL Final  . Eosinophils Relative 06/10/2016 3  % Final  . Eosinophils Absolute 06/10/2016 0.1  0 - 0.7 K/uL Final  .  Basophils Relative 06/10/2016 2  % Final  . Basophils Absolute 06/10/2016 0.0  0 - 0.1 K/uL Final  . Sodium 06/10/2016 134* 135 - 145 mmol/L Final  . Potassium 06/10/2016 4.2  3.5 - 5.1 mmol/L Final  . Chloride 06/10/2016 104  101 - 111 mmol/L Final  . CO2 06/10/2016 25  22 - 32 mmol/L Final  . Glucose, Bld 06/10/2016 117* 65 - 99 mg/dL Final  . BUN 06/10/2016 11  6 - 20 mg/dL Final  . Creatinine, Ser 06/10/2016 0.63  0.44 - 1.00 mg/dL Final  . Calcium 06/10/2016 8.4* 8.9 - 10.3 mg/dL Final  . Total Protein 06/10/2016 7.4  6.5 - 8.1 g/dL Final  . Albumin 06/10/2016 3.0* 3.5 - 5.0 g/dL Final  . AST 06/10/2016 20  15 - 41 U/L Final  . ALT 06/10/2016 11* 14 - 54 U/L Final  . Alkaline Phosphatase 06/10/2016 81  38 - 126 U/L Final  . Total Bilirubin 06/10/2016 0.6  0.3 - 1.2 mg/dL Final  . GFR calc non Af Amer 06/10/2016 >60  >60 mL/min Final  . GFR calc Af Amer 06/10/2016 >60  >60 mL/min Final   Comment: (NOTE) The eGFR has been calculated using the CKD EPI equation. This calculation has not been validated in all clinical situations. eGFR's persistently <60 mL/min signify possible Chronic Kidney Disease.   . Anion gap 06/10/2016 5  5 - 15 Final  . Prothrombin Time 06/10/2016 26.4* 11.4 - 15.2 seconds Final  . INR 06/10/2016 2.38   Final    Assessment:  Oreoluwa Gilmer is a 82 y.o. female African-American woman with JAK2+ polycythemia rubra vera (PV) previously on a phlebotomy program and hydroxyurea. She received P32 in an attempt to manage her counts in 03/2015.   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 She was diagnosed with bilateral lower extremity DVTs on 05/18/2015 and bilateral pulmonary emboli on 05/24/2015. She underwent IVC filter placement on 05/25/2015.  She is on a fluctuating dose of Coumadin (total weekly dose 36 mg).  She is on a baby aspirin.  She developed  progressive erythrocytosis, thrombocytosis, and leukocytosis.  She underwent phlebotomy for a hematocrit of 55.4 on 09/23/2015.  Hematocrit decreased to 50.0, but has again increased after initiation of oral iron.  Platelet count has increased from 1.1 million to 1.4 million.  White count increased from 23,000 - 28,000 to 31,800.  She has been back on hydroxyurea since 10/02/2015.  Initial dose was 1000 mg a day.  She is currently taking 4 pills 5 days/week and 3 pills 2 days/week (total weekly dose: 26 pills).   She requires periodic phlebotomies (goal hematocrit 42).  Platelet count remains elevated (secondary to PV and likely some component of iron deficiency).  Goal platelet count is 400,000.  Hematocrit and ANC are adequate.  Platelet count is 709,000 (improving).  Ferritin is 10 today.  Symptomatically, she feels good.  Exam reveals no appreciable hepatosplenomegaly.  INR is X on Coumadin 5 mg 6 days a week and 6 mg 1 day a week (total weekly dose 36 mg).  Plan: 1.  Labs today:  CBC with diff, CMP, PT/INR.   2.  No phlebotomy today.  Hematocrit is at goal.  3.  Continue current dose of hydroxyurea. 4.  Continue baby aspirin. 5.  Increase Coumadin to 5 mg 6 days/week and 6 mg 1 day/week (total weekly dose: 36 mg). 6.  Anticipate decreasing frequency of visits if INR stable and does not require phlebotomy. 7.  RTC weekly for CBC with diff and PT/INR.  Decrease frequency if stable. 8.  RTC in 4 weeks for MD assess, labs (CBC with diff, CMP, PT/INR) +/- phlebotomy.   Lequita Asal, MD  06/14/2016, 4:28 PM

## 2016-06-16 ENCOUNTER — Inpatient Hospital Stay: Payer: Medicare Other

## 2016-06-16 ENCOUNTER — Inpatient Hospital Stay: Payer: Medicare Other | Admitting: Hematology and Oncology

## 2016-06-17 ENCOUNTER — Inpatient Hospital Stay: Payer: Medicare Other

## 2016-06-17 ENCOUNTER — Inpatient Hospital Stay: Payer: Medicare Other | Admitting: Hematology and Oncology

## 2016-06-17 NOTE — Progress Notes (Deleted)
[facility] day:  06/17/2016   Chief Complaint: Yolanda Wells is a 82 y.o. female with polycythemia rubra vera (PV) who is seen for 2 month assessment on hydroxyurea and Coumadin and possible phlebotomy.  HPI:   The patient was last seen in the medical oncology clinic on 05/09/2016.  At that time, she felt good.  Exam revealed no appreciable hepatosplenomegaly.  INR was 1.55 on Coumadin 5 mg 5 days a week and 4 mg 2 days a week (total weekly dose 33 mg).  She has had follow-up counts.  Last counts on 06/10/2016 revealed a hematocrit of 40.0, hemoglobin 12.9, MCV 84.3, platelets 608,000, WBC 2300 with an ANC of 1200.  Decision was made to continue her current dose of hydroxyurea, as her platelet count was decreasing (prior 664,00 on 05/30/2016).  She has been taking hydroxyurea 4 pills 5 days a week and 3 pills 2 days per week (26 pills/week).  INR was 2.38 on 06/10/2016.  Coumadin dose remained at 5 mg 6 days a week and 6 mg on Fridays (total weekly dose 36 mg).  Symptomatically, she feels good.  She is eating well.  She denies any bruising or bleeding.  She denies any infections.  She is planning a trip to [place].   Past Medical History:  Diagnosis Date  . Arthritis   . Collagen vascular disease ([place])   . Deep venous thrombosis (HCC)    right lower extremity  . Dependent edema   . History of bilateral hip replacements   . History of hysterectomy   . Hypertension   . Polycythemia vera ([place])   . Stroke [facility])    Pt reports she was told she had a stroke in may    Past Surgical History:  Procedure Laterality Date  . ABDOMINAL HYSTERECTOMY    . CARDIAC CATHETERIZATION Right 04/03/2015   Procedure: CENTRAL LINE INSERTION;  Surgeon: Sherri Rad, MD;  Location: [facility] ORS;  Service: General;  Laterality: Right;  . COLECTOMY WITH COLOSTOMY CREATION/HARTMANN PROCEDURE N/A 04/03/2015   Procedure: COLECTOMY WITH COLOSTOMY CREATION/HARTMANN PROCEDURE;   Surgeon: Sherri Rad, MD;  Location: [facility] ORS;  Service: General;  Laterality: N/A;  . JOINT REPLACEMENT     Left and Right Hip  . PERIPHERAL VASCULAR CATHETERIZATION N/A 04/02/2015   Procedure: Visceral Angiography;  Surgeon: Algernon Huxley, MD;  Location: [place] [facility];  Service: Cardiovascular;  Laterality: N/A;  . PERIPHERAL VASCULAR CATHETERIZATION N/A 04/02/2015   Procedure: Visceral Artery Intervention;  Surgeon: Algernon Huxley, MD;  Location: [place] [facility];  Service: Cardiovascular;  Laterality: N/A;  . PERIPHERAL VASCULAR CATHETERIZATION N/A 05/25/2015   Procedure: IVC Filter Insertion;  Surgeon: Katha Cabal, MD;  Location: [place] [facility];  Service: Cardiovascular;  Laterality: N/A;  . TOE AMPUTATION     right    Family History  Problem Relation Age of Onset  . Heart attack Father   . Hypertension    . Arthritis-Osteo    . COPD Father   . Diabetes Brother   . Osteosarcoma Son     Social History:  reports that she has never smoked. She has never used smokeless tobacco. She reports that she does not drink alcohol or use drugs.  She lives alone on [address] at [place] (independent living). Her grand-daughter visits regularly [name] [phone]).  The patient is accompanied by her grand-daughter today.  Allergies: No Known Allergies  Current Medications: Current Outpatient Prescriptions  Medication Sig Dispense Refill  . aspirin EC 81 MG tablet Take 1 tablet (81 mg total) by mouth daily. 30 tablet 2  . docusate sodium (COLACE) 100 MG capsule Take 100 mg by mouth 2 (two) times daily as needed for mild constipation.     . folic acid (FOLVITE) 712 MCG tablet Take 400 mcg by mouth daily.    . hydroxyurea (HYDREA) 500 MG capsule Pt takes 4 capsules on Wed, Saturday, and Sunday,take 3 capsules on Monday, Tues Thurs, and Friday. 96 capsule 3  . hydroxyurea (HYDREA) 500 MG capsule Take 4 capsules 5 days a week and 3 capsules 2 days a week 30 capsule 3  .  ipratropium-albuterol (DUONEB) 0.5-2.5 (3) MG/3ML SOLN Take 3 mLs by nebulization every 4 (four) hours. (Patient taking differently: Take 3 mLs by nebulization every 4 (four) hours as needed (for wheezing/shortness of breath). ) 360 mL 6  . menthol-cetylpyridinium (CEPACOL) 3 MG lozenge Take 1 lozenge (3 mg total) by mouth as needed for sore throat. 100 tablet 12  . Polyethylene Glycol POWD Take 17 g by mouth daily. 527 g 5  . polyethylene glycol powder (GLYCOLAX/MIRALAX) powder     . potassium chloride SA (K-DUR,KLOR-CON) 20 MEQ tablet Take 1 tablet (20 mEq total) by mouth 2 (two) times daily. 30 tablet 0  . warfarin (COUMADIN) 1 MG tablet Take 96m on Mon, Wed, Fri, and Sun.  Take 6 mg on Tues, Thurs, Sat 20 tablet 3  . warfarin (COUMADIN) 3 MG tablet Pt to take 533mon Mon, Wed, Fri, and Sun.  Then 6 mg tues, Thurs, Sat. 45 tablet 3   No current facility-administered medications for this visit.     Review of Systems:  GENERAL:  Feels good.  No fevers or sweats.  Weight stable. PERFORMANCE STATUS (ECOG):  3 HEENT:  No visual changes, runny nose, sore throat, mouth sores or tenderness. Lungs:  No shortness of breath or cough.  No hemoptysis. Cardiac:  No chest pain, palpitations, orthopnea, or PND. GI:  No nausea, vomiting, diarrhea, constipation, melena or hematochezia. GU:  No urgency, frequency, dysuria, or hematuria. Musculoskeletal:  Stiffness.  Knee problems ("bone on bone").  No muscle tenderness. Extremities:  Chronic swelling in legs. Skin:  Dry skin.  No rashes or skin changes. Neuro:  No headache, numbness or weakness, balance or coordination issues. Endocrine:  No diabetes, thyroid issues, hot flashes or night sweats. Psych:  No mood changes, depression or anxiety. Pain:  No focal pain. Review of systems:  All other systems reviewed and found to be negative.  Physical Exam: There were no vitals taken for this visit. GENERAL:  Elderly woman sitting comfortably in a wheelchair  in the exam room in no acute distress. MENTAL STATUS:  Alert and oriented to person, place and time. HEAD: GrPearline Cablesair.  Normocephalic, atraumatic, face symmetric, no Cushingoid features. EYES:  Brown eyes.  Pupils equal round and reactive to light and accomodation.  No conjunctivitis or scleral icterus. ENT:  Oropharynx clear without lesion.  Upper dentures.  Tongue normal. Mucous membranes moist.  RESPIRATORY:  Clear to auscultation without rales, wheezes or rhonchi. CARDIOVASCULAR:  Regular rate and rhythm without murmur, rub or gallop. ABDOMEN:  Left sided colostomy.  Soft, non-tender, with active bowel sounds, and no appreciable hepatosplenomegaly.  No masses. SKIN:  No rashes, ulcers or lesions. EXTREMITIES:  No edema, skin discoloration or tenderness.  No palpable cords. LYMPH NODES: No palpable cervical, supraclavicular, axillary or inguinal adenopathy  NEUROLOGICAL: Unremarkable.  PSYCH:  Appropriate.  No visits with results within 3 Day(s) from this visit.  Latest known visit with results is:  Orders Only on 06/10/2016  Component Date Value Ref Range Status  . WBC 06/10/2016 2.3* 3.6 - 11.0 K/uL Final  . RBC 06/10/2016 4.74  3.80 - 5.20 MIL/uL Final  . Hemoglobin 06/10/2016 12.9  12.0 - 16.0 g/dL Final  . HCT 06/10/2016 40.0  35.0 - 47.0 % Final  . MCV 06/10/2016 84.3  80.0 - 100.0 fL Final  . MCH 06/10/2016 27.2  26.0 - 34.0 pg Final  . MCHC 06/10/2016 32.2  32.0 - 36.0 g/dL Final  . RDW 06/10/2016 29.2* 11.5 - 14.5 % Final  . Platelets 06/10/2016 608* 150 - 440 K/uL Final  . Neutrophils Relative % 06/10/2016 52  % Final  . Neutro Abs 06/10/2016 1.2* 1.4 - 6.5 K/uL Final  . Lymphocytes Relative 06/10/2016 38  % Final  . Lymphs Abs 06/10/2016 0.9* 1.0 - 3.6 K/uL Final  . Monocytes Relative 06/10/2016 5  % Final  . Monocytes Absolute 06/10/2016 0.1* 0.2 - 0.9 K/uL Final  . Eosinophils Relative 06/10/2016 3  % Final  . Eosinophils Absolute 06/10/2016 0.1  0 - 0.7 K/uL Final  .  Basophils Relative 06/10/2016 2  % Final  . Basophils Absolute 06/10/2016 0.0  0 - 0.1 K/uL Final  . Sodium 06/10/2016 134* 135 - 145 mmol/L Final  . Potassium 06/10/2016 4.2  3.5 - 5.1 mmol/L Final  . Chloride 06/10/2016 104  101 - 111 mmol/L Final  . CO2 06/10/2016 25  22 - 32 mmol/L Final  . Glucose, Bld 06/10/2016 117* 65 - 99 mg/dL Final  . BUN 06/10/2016 11  6 - 20 mg/dL Final  . Creatinine, Ser 06/10/2016 0.63  0.44 - 1.00 mg/dL Final  . Calcium 06/10/2016 8.4* 8.9 - 10.3 mg/dL Final  . Total Protein 06/10/2016 7.4  6.5 - 8.1 g/dL Final  . Albumin 06/10/2016 3.0* 3.5 - 5.0 g/dL Final  . AST 06/10/2016 20  15 - 41 U/L Final  . ALT 06/10/2016 11* 14 - 54 U/L Final  . Alkaline Phosphatase 06/10/2016 81  38 - 126 U/L Final  . Total Bilirubin 06/10/2016 0.6  0.3 - 1.2 mg/dL Final  . GFR calc non Af Amer 06/10/2016 >60  >60 mL/min Final  . GFR calc Af Amer 06/10/2016 >60  >60 mL/min Final   Comment: (NOTE) The eGFR has been calculated using the CKD EPI equation. This calculation has not been validated in all clinical situations. eGFR's persistently <60 mL/min signify possible Chronic Kidney Disease.   . Anion gap 06/10/2016 5  5 - 15 Final  . Prothrombin Time 06/10/2016 26.4* 11.4 - 15.2 seconds Final  . INR 06/10/2016 2.38   Final    Assessment:  Yolanda Wells is a 82 y.o. female African-American woman with JAK2+ polycythemia rubra vera (PV) previously on a phlebotomy program and hydroxyurea. She received P32 in an attempt to manage her counts in 03/2015.   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 She was diagnosed with bilateral lower extremity DVTs on 05/18/2015 and bilateral pulmonary emboli on 05/24/2015. She underwent IVC filter placement on 05/25/2015.  She is on a fluctuating dose of Coumadin (total weekly dose 36 mg).  She is on a baby aspirin.  She developed  progressive erythrocytosis, thrombocytosis, and leukocytosis.  She underwent phlebotomy for a hematocrit of 55.4 on 09/23/2015.  Hematocrit decreased to 50.0, but has again increased after initiation of oral iron.  Platelet count has increased from 1.1 million to 1.4 million.  White count increased from 23,000 - 28,000 to 31,800.  She has been back on hydroxyurea since 10/02/2015.  Initial dose was 1000 mg a day.  She is currently taking 4 pills 5 days/week and 3 pills 2 days/week (total weekly dose: 26 pills).   She requires periodic phlebotomies (goal hematocrit 42).  Platelet count remains elevated (secondary to PV and likely some component of iron deficiency).  Goal platelet count is 400,000.  Hematocrit and ANC are adequate.  Platelet count is 709,000 (improving).  Ferritin is 10 today.  Symptomatically, she feels good.  Exam reveals no appreciable hepatosplenomegaly.  INR is X on Coumadin 5 mg 6 days a week and 6 mg 1 day a week (total weekly dose 36 mg).  Plan: 1.  Labs today:  CBC with diff, CMP, PT/INR.   2.  No phlebotomy today.  Hematocrit is at goal.  3.  Continue current dose of hydroxyurea. 4.  Continue baby aspirin. 5.  Increase Coumadin to 5 mg 6 days/week and 6 mg 1 day/week (total weekly dose: 36 mg). 6.  Anticipate decreasing frequency of visits if INR stable and does not require phlebotomy. 7.  RTC weekly for CBC with diff and PT/INR.  Decrease frequency if stable. 8.  RTC in 4 weeks for MD assess, labs (CBC with diff, CMP, PT/INR) +/- phlebotomy.   Lequita Asal, MD  06/17/2016, 5:08 AM

## 2016-06-18 ENCOUNTER — Other Ambulatory Visit: Payer: Self-pay | Admitting: *Deleted

## 2016-06-18 MED ORDER — WARFARIN SODIUM 5 MG PO TABS: 5.0000 mg | ORAL_TABLET | Freq: Every day | ORAL | 2 refills | Status: DC

## 2016-06-18 MED ORDER — WARFARIN SODIUM 1 MG PO TABS: ORAL_TABLET | ORAL | 1 refills | Status: DC

## 2016-07-07 ENCOUNTER — Other Ambulatory Visit: Payer: Self-pay | Admitting: *Deleted

## 2016-07-07 ENCOUNTER — Inpatient Hospital Stay (HOSPITAL_BASED_OUTPATIENT_CLINIC_OR_DEPARTMENT_OTHER): Payer: Medicare Other | Admitting: Hematology and Oncology

## 2016-07-07 ENCOUNTER — Encounter: Payer: Self-pay | Admitting: Hematology and Oncology

## 2016-07-07 ENCOUNTER — Inpatient Hospital Stay: Payer: Medicare Other

## 2016-07-07 VITALS — BP 132/84 | HR 83 | Temp 96.0°F | Resp 18 | Wt 209.0 lb

## 2016-07-07 DIAGNOSIS — D45 Polycythemia vera: Secondary | ICD-10-CM

## 2016-07-07 DIAGNOSIS — I10 Essential (primary) hypertension: Secondary | ICD-10-CM

## 2016-07-07 DIAGNOSIS — I2782 Chronic pulmonary embolism: Secondary | ICD-10-CM

## 2016-07-07 DIAGNOSIS — Z7982 Long term (current) use of aspirin: Secondary | ICD-10-CM

## 2016-07-07 DIAGNOSIS — I739 Peripheral vascular disease, unspecified: Secondary | ICD-10-CM | POA: Diagnosis not present

## 2016-07-07 DIAGNOSIS — M1990 Unspecified osteoarthritis, unspecified site: Secondary | ICD-10-CM | POA: Diagnosis not present

## 2016-07-07 DIAGNOSIS — Z79899 Other long term (current) drug therapy: Secondary | ICD-10-CM

## 2016-07-07 DIAGNOSIS — Z7901 Long term (current) use of anticoagulants: Secondary | ICD-10-CM

## 2016-07-07 DIAGNOSIS — I82409 Acute embolism and thrombosis of unspecified deep veins of unspecified lower extremity: Secondary | ICD-10-CM

## 2016-07-07 LAB — COMPREHENSIVE METABOLIC PANEL
ALT: 10 U/L — ABNORMAL LOW (ref 14–54)
AST: 23 U/L (ref 15–41)
Albumin: 3.1 g/dL — ABNORMAL LOW (ref 3.5–5.0)
Alkaline Phosphatase: 76 U/L (ref 38–126)
Anion gap: 9 (ref 5–15)
BUN: 12 mg/dL (ref 6–20)
CO2: 22 mmol/L (ref 22–32)
Calcium: 8.5 mg/dL — ABNORMAL LOW (ref 8.9–10.3)
Chloride: 103 mmol/L (ref 101–111)
Creatinine, Ser: 0.63 mg/dL (ref 0.44–1.00)
GFR calc Af Amer: >60 mL/min (ref >60)
GFR calc non Af Amer: >60 mL/min (ref >60)
Glucose, Bld: 162 mg/dL — ABNORMAL HIGH (ref 65–99)
Potassium: 3.7 mmol/L (ref 3.5–5.1)
Sodium: 134 mmol/L — ABNORMAL LOW (ref 135–145)
Total Bilirubin: 0.6 mg/dL (ref 0.3–1.2)
Total Protein: 7.4 g/dL (ref 6.5–8.1)

## 2016-07-07 LAB — CBC WITH DIFFERENTIAL/PLATELET
Basophils Absolute: 0 10*3/uL (ref 0–0.1)
Basophils Relative: 1 %
Eosinophils Absolute: 0 10*3/uL (ref 0–0.7)
Eosinophils Relative: 2 %
HCT: 41.7 % (ref 35.0–47.0)
Hemoglobin: 13.9 g/dL (ref 12.0–16.0)
Lymphocytes Relative: 32 %
Lymphs Abs: 0.8 10*3/uL — ABNORMAL LOW (ref 1.0–3.6)
MCH: 29 pg (ref 26.0–34.0)
MCHC: 33.2 g/dL (ref 32.0–36.0)
MCV: 87.2 fL (ref 80.0–100.0)
Monocytes Absolute: 0.1 10*3/uL — ABNORMAL LOW (ref 0.2–0.9)
Monocytes Relative: 5 %
Neutro Abs: 1.5 10*3/uL (ref 1.4–6.5)
Neutrophils Relative %: 62 %
Platelets: 485 10*3/uL — ABNORMAL HIGH (ref 150–440)
RBC: 4.78 MIL/uL (ref 3.80–5.20)
RDW: 31.7 % — ABNORMAL HIGH (ref 11.5–14.5)
WBC: 2.4 10*3/uL — ABNORMAL LOW (ref 3.6–11.0)

## 2016-07-07 LAB — PROTIME-INR
INR: 2.59
Prothrombin Time: 28.3 s — ABNORMAL HIGH (ref 11.4–15.2)

## 2016-07-07 NOTE — Progress Notes (Signed)
Patient is here with her granddaughter. No problems

## 2016-07-07 NOTE — Progress Notes (Signed)
[facility] day:  07/07/2016   Chief Complaint: Yolanda Wells is a 82 y.o. female with polycythemia rubra vera (PV) who is seen for 2 month assessment on hydroxyurea and Coumadin and possible phlebotomy.  HPI:   The patient was last seen in the medical oncology clinic on 05/09/2016.  At that time, she felt good.  Exam revealed no appreciable hepatosplenomegaly.  INR was 1.55 on Coumadin 5 mg 5 days a week and 4 mg 2 days a week (total weekly dose 33 mg).  She has had follow-up counts.  Last counts on 06/10/2016 revealed a hematocrit of 40.0, hemoglobin 12.9, MCV 84.3, platelets 608,000, WBC 2300 with an ANC of 1200.  Decision was made to continue her current dose of hydroxyurea, as her platelet count was decreasing (prior 664,00 on 05/30/2016).  She has been taking hydroxyurea 4 pills 5 days a week and 3 pills 2 days per week (26 pills/week).  INR was 2.38 on 06/10/2016.  Coumadin dose remained at 5 mg 6 days a week and 6 mg on Fridays (total weekly dose 36 mg).  Symptomatically, she continues to do well.  She denies any bruising or bleeding. She is taking Coumadin 5 mg Saturday - Thursday and 6 mg on Friday.  She is taking hydroxyurea for 4 capsules every day except 3 capsules on Friday.   Past Medical History:  Diagnosis Date  . Arthritis   . Collagen vascular disease ([place])   . Deep venous thrombosis ([facility])    right lower extremity  . Dependent edema   . History of bilateral hip replacements   . History of hysterectomy   . Hypertension   . Polycythemia vera ([place])   . Stroke [facility])    Pt reports she was told she had a stroke in may    Past Surgical History:  Procedure Laterality Date  . ABDOMINAL HYSTERECTOMY    . CARDIAC CATHETERIZATION Right 04/03/2015   Procedure: CENTRAL LINE INSERTION;  Surgeon: Sherri Rad, MD;  Location: [facility] ORS;  Service: General;  Laterality: Right;  . COLECTOMY WITH COLOSTOMY CREATION/HARTMANN PROCEDURE N/A  04/03/2015   Procedure: COLECTOMY WITH COLOSTOMY CREATION/HARTMANN PROCEDURE;  Surgeon: Sherri Rad, MD;  Location: [facility] ORS;  Service: General;  Laterality: N/A;  . JOINT REPLACEMENT     Left and Right Hip  . PERIPHERAL VASCULAR CATHETERIZATION N/A 04/02/2015   Procedure: Visceral Angiography;  Surgeon: Algernon Huxley, MD;  Location: [facility];  Service: Cardiovascular;  Laterality: N/A;  . PERIPHERAL VASCULAR CATHETERIZATION N/A 04/02/2015   Procedure: Visceral Artery Intervention;  Surgeon: Algernon Huxley, MD;  Location: [facility];  Service: Cardiovascular;  Laterality: N/A;  . PERIPHERAL VASCULAR CATHETERIZATION N/A 05/25/2015   Procedure: IVC Filter Insertion;  Surgeon: Katha Cabal, MD;  Location: [facility];  Service: Cardiovascular;  Laterality: N/A;  . TOE AMPUTATION     right    Family History  Problem Relation Age of Onset  . Heart attack Father   . Hypertension    . Arthritis-Osteo    . COPD Father   . Diabetes Brother   . Osteosarcoma Son     Social History:  reports that she has never smoked. She has never used smokeless tobacco. She reports that she does not drink alcohol or use drugs.  She lives alone on [address] at [place] (independent living). She had a great trip to [place] where she visited family.  Her grand-daughter visits  regularly [name] Nyoka Cowden [phone]).  The patient is accompanied by her grand-daughter today.  Allergies: No Known Allergies  Current Medications: Current Outpatient Prescriptions  Medication Sig Dispense Refill  . aspirin EC 81 MG tablet Take 1 tablet (81 mg total) by mouth daily. 30 tablet 2  . docusate sodium (COLACE) 100 MG capsule Take 100 mg by mouth 2 (two) times daily as needed for mild constipation.     . folic acid (FOLVITE) 671 MCG tablet Take 400 mcg by mouth daily.    . hydroxyurea (HYDREA) 500 MG capsule Pt takes 4 capsules on Wed, Saturday, and Sunday,take 3 capsules on Monday, Tues Thurs, and  Friday. (Patient taking differently: Pt takes 4 capsules on Wed, Saturday, and Sunday,take 3 capsules on Monday, Tues Thurs, and Friday.   07/07/16 granddaughter reports 4 caps sun,mon,tues,wed,thurs, Saturday. She takes 3 caps on Friday.) 96 capsule 3  . Polyethylene Glycol POWD Take 17 g by mouth daily. 527 g 5  . potassium chloride SA (K-DUR,KLOR-CON) 20 MEQ tablet Take 1 tablet (20 mEq total) by mouth 2 (two) times daily. 30 tablet 0  . warfarin (COUMADIN) 1 MG tablet Take 1 tab (1 MG) with a 5 mg tab every Friday for a total dose of 6 mg every Friday. Take a 5 mg tablet Sat - Thursday 15 tablet 1  . warfarin (COUMADIN) 5 MG tablet Take 1 tablet (5 mg total) by mouth daily. Take 5 mg Sat - Thursday and take 6 mg every Friday 30 tablet 2  . hydroxyurea (HYDREA) 500 MG capsule Take 4 capsules 5 days a week and 3 capsules 2 days a week (Patient not taking: Reported on 07/07/2016) 30 capsule 3  . ipratropium-albuterol (DUONEB) 0.5-2.5 (3) MG/3ML SOLN Take 3 mLs by nebulization every 4 (four) hours. (Patient not taking: Reported on 07/07/2016) 360 mL 6  . menthol-cetylpyridinium (CEPACOL) 3 MG lozenge Take 1 lozenge (3 mg total) by mouth as needed for sore throat. (Patient not taking: Reported on 07/07/2016) 100 tablet 12   No current facility-administered medications for this visit.     Review of Systems:  GENERAL:  Feels good.  No fevers or sweats.  Weight up 20 pounds (? accurate). PERFORMANCE STATUS (ECOG):  3 HEENT:  No visual changes, runny nose, sore throat, mouth sores or tenderness. Lungs:  No shortness of breath or cough.  No hemoptysis. Cardiac:  No chest pain, palpitations, orthopnea, or PND. GI:  No nausea, vomiting, diarrhea, constipation, melena or hematochezia. GU:  No urgency, frequency, dysuria, or hematuria. Musculoskeletal:  Knee problems ("bone on bone").  No muscle tenderness. Extremities:  Chronic swelling in legs. Skin:  Dry skin.  No rashes or skin changes. Neuro:  No  headache, numbness or weakness, balance or coordination issues. Endocrine:  No diabetes, thyroid issues, hot flashes or night sweats. Psych:  No mood changes, depression or anxiety. Pain:  No focal pain. Review of systems:  All other systems reviewed and found to be negative.  Physical Exam: Blood pressure 132/84, pulse 83, temperature (!) 96 F (35.6 C), temperature source Tympanic, resp. rate (!) 0, weight 209 lb (94.8 kg), SpO2 96 %. GENERAL:  Elderly woman sitting comfortably in a wheelchair in the exam room in no acute distress. MENTAL STATUS:  Alert and oriented to person, place and time. HEAD: Pearline Cables hair.  Normocephalic, atraumatic, face symmetric, no Cushingoid features. EYES:  Brown eyes.  Left scleral hemorrhage.  Pupils equal round and reactive to light and accomodation.  No conjunctivitis or  scleral icterus. ENT:  Oropharynx clear without lesion.  Upper dentures.  Tongue normal. Mucous membranes moist.  RESPIRATORY:  Clear to auscultation without rales, wheezes or rhonchi. CARDIOVASCULAR:  Regular rate and rhythm without murmur, rub or gallop. ABDOMEN:  Left sided colostomy.  Soft, non-tender, with active bowel sounds, and no appreciable hepatosplenomegaly.  No masses. SKIN:  No rashes, ulcers or lesions. EXTREMITIES:  Mild left leg edema.  No skin discoloration or tenderness.  No palpable cords. LYMPH NODES: No palpable cervical, supraclavicular, axillary or inguinal adenopathy  NEUROLOGICAL: Unremarkable. PSYCH:  Appropriate.   Appointment on 07/07/2016  Component Date Value Ref Range Status  . WBC 07/07/2016 2.4* 3.6 - 11.0 K/uL Final  . RBC 07/07/2016 4.78  3.80 - 5.20 MIL/uL Final  . Hemoglobin 07/07/2016 13.9  12.0 - 16.0 g/dL Final  . HCT 07/07/2016 41.7  35.0 - 47.0 % Final  . MCV 07/07/2016 87.2  80.0 - 100.0 fL Final  . MCH 07/07/2016 29.0  26.0 - 34.0 pg Final  . MCHC 07/07/2016 33.2  32.0 - 36.0 g/dL Final  . RDW 07/07/2016 31.7* 11.5 - 14.5 % Final  .  Platelets 07/07/2016 485* 150 - 440 K/uL Final  . Neutrophils Relative % 07/07/2016 62  % Final  . Neutro Abs 07/07/2016 1.5  1.4 - 6.5 K/uL Final  . Lymphocytes Relative 07/07/2016 32  % Final  . Lymphs Abs 07/07/2016 0.8* 1.0 - 3.6 K/uL Final  . Monocytes Relative 07/07/2016 5  % Final  . Monocytes Absolute 07/07/2016 0.1* 0.2 - 0.9 K/uL Final  . Eosinophils Relative 07/07/2016 2  % Final  . Eosinophils Absolute 07/07/2016 0.0  0 - 0.7 K/uL Final  . Basophils Relative 07/07/2016 1  % Final  . Basophils Absolute 07/07/2016 0.0  0 - 0.1 K/uL Final  . Sodium 07/07/2016 134* 135 - 145 mmol/L Final  . Potassium 07/07/2016 3.7  3.5 - 5.1 mmol/L Final  . Chloride 07/07/2016 103  101 - 111 mmol/L Final  . CO2 07/07/2016 22  22 - 32 mmol/L Final  . Glucose, Bld 07/07/2016 162* 65 - 99 mg/dL Final  . BUN 07/07/2016 12  6 - 20 mg/dL Final  . Creatinine, Ser 07/07/2016 0.63  0.44 - 1.00 mg/dL Final  . Calcium 07/07/2016 8.5* 8.9 - 10.3 mg/dL Final  . Total Protein 07/07/2016 7.4  6.5 - 8.1 g/dL Final  . Albumin 07/07/2016 3.1* 3.5 - 5.0 g/dL Final  . AST 07/07/2016 23  15 - 41 U/L Final  . ALT 07/07/2016 10* 14 - 54 U/L Final  . Alkaline Phosphatase 07/07/2016 76  38 - 126 U/L Final  . Total Bilirubin 07/07/2016 0.6  0.3 - 1.2 mg/dL Final  . GFR calc non Af Amer 07/07/2016 >60  >60 mL/min Final  . GFR calc Af Amer 07/07/2016 >60  >60 mL/min Final   Comment: (NOTE) The eGFR has been calculated using the CKD EPI equation. This calculation has not been validated in all clinical situations. eGFR's persistently <60 mL/min signify possible Chronic Kidney Disease.   . Anion gap 07/07/2016 9  5 - 15 Final  . Prothrombin Time 07/07/2016 28.3* 11.4 - 15.2 seconds Final  . INR 07/07/2016 2.59   Final    Assessment:  Yolanda Wells is a 82 y.o. female African-American woman with JAK2+ polycythemia rubra vera (PV) previously on a phlebotomy program and hydroxyurea. She received P32 in an attempt to  manage her counts in 03/2015.   Course has been complicated by  a cerebellar CVA on 04/01/2015, splenic flexure bleeding requiring micro-embolization then colectomy on 04/03/2015. She was diagnosed with bilateral lower extremity DVTs on 05/18/2015 and bilateral pulmonary emboli on 05/24/2015. She underwent IVC filter placement on 05/25/2015.  She is on a fluctuating dose of Coumadin (total weekly dose 36 mg).  She is on a baby aspirin.  She developed progressive erythrocytosis, thrombocytosis, and leukocytosis.  She underwent phlebotomy for a hematocrit of 55.4 on 09/23/2015.  Hematocrit decreased to 50.0, but has again increased after initiation of oral iron.  Platelet count has increased from 1.1 million to 1.4 million.  White count increased from 23,000 - 28,000 to 31,800.  She has been back on hydroxyurea since 10/02/2015.  Initial dose was 1000 mg a day.  She is currently taking 4 pills 6 days/week and 3 pills 1 days/week (total weekly dose: 27 pills).   She requires periodic phlebotomies (goal hematocrit 42).  Platelet count remains elevated (secondary to PV and likely some component of iron deficiency).  Goal platelet count is 400,000.  Hematocrit and ANC are adequate.  Platelet count is 709,000 (improving).  Ferritin is 10 today.  Symptomatically, she feels good.  Exam reveals no appreciable hepatosplenomegaly.  INR is 2.59 on Coumadin 5 mg 6 days a week and 6 mg 1 day a week (total weekly dose 36 mg).  Plan: 1.  Labs today:  CBC with diff, CMP, PT/INR. 2.  Continue current dose of Coumadin. 3.  Continue current dose of hydroxyurea. 4.  Continue baby aspirin. 5.  No phlebotomy today 6.  RTC in 2 weeks for labs (CBC with diff, INR) +/- phlebotomy 7.  RTC in 4 weeks for MD assessment and labs (CBC with diff, CMP, PT/INR) +/- phlebotomy   Lequita Asal, MD  07/07/2016, 10:41 AM

## 2016-07-11 ENCOUNTER — Telehealth: Payer: Self-pay | Admitting: *Deleted

## 2016-07-11 NOTE — Telephone Encounter (Signed)
Inquiring if there are any precautions they need to take with PT in regards to her [place]

## 2016-07-11 NOTE — Telephone Encounter (Signed)
Per Dr Mike Gip, not to be rough with patient as she is on anticoagulation therapy. Left message on [facility] VM

## 2016-07-22 ENCOUNTER — Inpatient Hospital Stay: Payer: Medicare Other

## 2016-07-22 ENCOUNTER — Inpatient Hospital Stay: Payer: Medicare Other | Attending: Hematology and Oncology

## 2016-07-22 ENCOUNTER — Telehealth: Payer: Self-pay | Admitting: *Deleted

## 2016-07-22 DIAGNOSIS — M1990 Unspecified osteoarthritis, unspecified site: Secondary | ICD-10-CM | POA: Diagnosis not present

## 2016-07-22 DIAGNOSIS — D45 Polycythemia vera: Secondary | ICD-10-CM | POA: Diagnosis present

## 2016-07-22 DIAGNOSIS — Z86718 Personal history of other venous thrombosis and embolism: Secondary | ICD-10-CM | POA: Diagnosis not present

## 2016-07-22 DIAGNOSIS — I82409 Acute embolism and thrombosis of unspecified deep veins of unspecified lower extremity: Secondary | ICD-10-CM

## 2016-07-22 DIAGNOSIS — I10 Essential (primary) hypertension: Secondary | ICD-10-CM | POA: Diagnosis not present

## 2016-07-22 DIAGNOSIS — Z7901 Long term (current) use of anticoagulants: Secondary | ICD-10-CM | POA: Diagnosis not present

## 2016-07-22 DIAGNOSIS — Z79899 Other long term (current) drug therapy: Secondary | ICD-10-CM | POA: Diagnosis not present

## 2016-07-22 DIAGNOSIS — Z7982 Long term (current) use of aspirin: Secondary | ICD-10-CM | POA: Insufficient documentation

## 2016-07-22 DIAGNOSIS — Z8673 Personal history of transient ischemic attack (TIA), and cerebral infarction without residual deficits: Secondary | ICD-10-CM | POA: Insufficient documentation

## 2016-07-22 LAB — PROTIME-INR
INR: 1.69
Prothrombin Time: 20.1 seconds — ABNORMAL HIGH (ref 11.4–15.2)

## 2016-07-22 LAB — CBC WITH DIFFERENTIAL/PLATELET
Basophils Absolute: 0 10*3/uL (ref 0–0.1)
Basophils Relative: 1 %
Eosinophils Absolute: 0 10*3/uL (ref 0–0.7)
Eosinophils Relative: 1 %
HCT: 39.2 % (ref 35.0–47.0)
Hemoglobin: 13.2 g/dL (ref 12.0–16.0)
Lymphocytes Relative: 45 %
Lymphs Abs: 0.8 10*3/uL — ABNORMAL LOW (ref 1.0–3.6)
MCH: 30 pg (ref 26.0–34.0)
MCHC: 33.6 g/dL (ref 32.0–36.0)
MCV: 89 fL (ref 80.0–100.0)
Monocytes Absolute: 0.1 10*3/uL — ABNORMAL LOW (ref 0.2–0.9)
Monocytes Relative: 5 %
Neutro Abs: 0.9 10*3/uL — ABNORMAL LOW (ref 1.4–6.5)
Neutrophils Relative %: 48 %
Platelets: 463 10*3/uL — ABNORMAL HIGH (ref 150–440)
RBC: 4.4 MIL/uL (ref 3.80–5.20)
RDW: 29.6 % — ABNORMAL HIGH (ref 11.5–14.5)
WBC: 1.8 10*3/uL — ABNORMAL LOW (ref 3.6–11.0)

## 2016-07-22 NOTE — Telephone Encounter (Signed)
Called patient's granddaughter, Yolanda Wells, to inquire regarding current dosage of coumadin and hydroxurea.  LVM for her to return call to [facility].

## 2016-07-23 ENCOUNTER — Encounter: Payer: Self-pay | Admitting: *Deleted

## 2016-07-23 ENCOUNTER — Telehealth: Payer: Self-pay | Admitting: *Deleted

## 2016-07-23 ENCOUNTER — Other Ambulatory Visit: Payer: Self-pay | Admitting: *Deleted

## 2016-07-23 DIAGNOSIS — I2699 Other pulmonary embolism without acute cor pulmonale: Secondary | ICD-10-CM

## 2016-07-23 NOTE — Telephone Encounter (Signed)
Called patient's granddaughter, [name], and discussed changing patient's Coumadin level as follows:  5 mg on Tues, Thurs, Sat and Sun.  6 mg on Mon, Wed and Fri.   Decrease hydroxurea to 1500 mg daily.  Also discussed neutropenic precautions.  [name] verbalized understanding and repeated back to me.  Patient to rtc next week for labs (CBC,INR).

## 2016-07-23 NOTE — Telephone Encounter (Signed)
Called patient's granddaughter to confirm the dosage of coumadin and hydroxurea patient is taking.  Coumadin - 5 mg Saturday - Thursday and 6 mg on Friday. Hydroxurea - 2000 mg Saturday - Thursday and 1500 on Friday.

## 2016-07-23 NOTE — Telephone Encounter (Signed)
  Ensure that she didn't miss any doses or didn't eat a lot of vitamin K rich food which which caused her INR to drop.  If nothing different occurred, then we will increase Coumadin:  5 mg  Tues, Thurs, Sat, Sun 6 mg  Mon, Wed, Fri  Regarding hydroxyurea, we will decrease to 1500 mg a day. Discuss neutropenic precautions.  Labs next week:  CBC with diff and INR.  M

## 2016-07-29 ENCOUNTER — Inpatient Hospital Stay: Payer: Medicare Other

## 2016-07-29 ENCOUNTER — Other Ambulatory Visit: Payer: Self-pay

## 2016-07-29 ENCOUNTER — Encounter: Payer: Self-pay | Admitting: Hematology and Oncology

## 2016-07-29 DIAGNOSIS — D45 Polycythemia vera: Secondary | ICD-10-CM | POA: Diagnosis not present

## 2016-07-29 DIAGNOSIS — I2699 Other pulmonary embolism without acute cor pulmonale: Secondary | ICD-10-CM

## 2016-07-29 LAB — CBC WITH DIFFERENTIAL/PLATELET
Basophils Absolute: 0 10*3/uL (ref 0–0.1)
Basophils Relative: 1 %
Eosinophils Absolute: 0 10*3/uL (ref 0–0.7)
Eosinophils Relative: 1 %
HCT: 41.9 % (ref 35.0–47.0)
Hemoglobin: 14 g/dL (ref 12.0–16.0)
Lymphocytes Relative: 28 %
Lymphs Abs: 0.7 10*3/uL — ABNORMAL LOW (ref 1.0–3.6)
MCH: 30.3 pg (ref 26.0–34.0)
MCHC: 33.4 g/dL (ref 32.0–36.0)
MCV: 90.7 fL (ref 80.0–100.0)
Monocytes Absolute: 0.1 10*3/uL — ABNORMAL LOW (ref 0.2–0.9)
Monocytes Relative: 5 %
Neutro Abs: 1.7 10*3/uL (ref 1.4–6.5)
Neutrophils Relative %: 65 %
Platelets: 540 10*3/uL — ABNORMAL HIGH (ref 150–440)
RBC: 4.61 MIL/uL (ref 3.80–5.20)
RDW: 32.1 % — ABNORMAL HIGH (ref 11.5–14.5)
WBC: 2.6 10*3/uL — ABNORMAL LOW (ref 3.6–11.0)

## 2016-07-29 LAB — PROTIME-INR
INR: 1.75
Prothrombin Time: 20.7 seconds — ABNORMAL HIGH (ref 11.4–15.2)

## 2016-07-30 ENCOUNTER — Telehealth: Payer: Self-pay | Admitting: *Deleted

## 2016-07-30 NOTE — Telephone Encounter (Signed)
Called and got voicemail and left message with the directions below.  I suggested that pt should take 6 mg on mon, tues, wed, fri, sat. And then take 5 mg Thursday and Sunday.   Dr. Mike Gip just wanted the 5 mg spread out and not back to back days.  I asked pt to call me and left my work number so she can let me know if another day works better as long as she takes 6 mg 5 days a week and 5 mg 2 days a week.

## 2016-07-30 NOTE — Telephone Encounter (Signed)
-----   Message from Lequita Asal, MD sent at 07/30/2016  5:21 PM EDT ----- Regarding: Please call patient- Coumadin dose change  If she is taking:  Coumadin  5 mg  Tues, Thurs, Sat, Sun Coumadin  6 mg  Mon, Wed, Fri  Change to:  Coumadin  5 mg  2 days/week spread out Coumadin  6 mg  5 days/week  M

## 2016-07-31 ENCOUNTER — Other Ambulatory Visit: Payer: Medicare Other

## 2016-08-04 ENCOUNTER — Other Ambulatory Visit: Payer: Self-pay | Admitting: Hematology and Oncology

## 2016-08-04 MED ORDER — HYDROXYUREA 500 MG PO CAPS: ORAL_CAPSULE | ORAL | 0 refills | Status: DC

## 2016-08-04 NOTE — Progress Notes (Signed)
[facility] day:  08/05/2016   Chief Complaint: Yolanda Wells is a 82 y.o. female with polycythemia rubra vera (PV) who is seen for 1 month assessment on hydroxyurea and Coumadin and possible phlebotomy.  HPI:   The patient was last seen in the medical oncology clinic on 07/07/2016.  At that time, she was doing well.  Counts included a hematocrit of 41.7, hemoglobin 13.9, MCV 87.2, platelets 485,000, WBC 2400 with an ANC of 1500.  Her hydroxyurea dose was maintained at 4 pills 6 days/week and 3 pills 1 day/week (total weekly dose: 27 pills).  She did not undergo a phlebotomy.  INR was 2.59 on Coumadin 5 mg 6 days a week and 6 mg 1 day a week (total weekly dose 36 mg).  Coumadin dose was not adjusted.  Counts have been followed closely.  CBC on 07/23/2016 revealed a hematocrit of 39.2, hemoglobin 13.2, MCV 89, platelets 463,00, WBC 1800 with an ANC of 900.  Hydroxyurea was decreased to 1500 mg a day (total weekly dose: 21 pills).  CBC on 07/29/2016 revealed a hematocrit of 41.9, hemoglobin 14.0, platelets 540,000, WBC 2600 with an ANC of 1700.    INR was 1.69 on 07/22/2016 and 1.75 on 07/29/2016.  Coumadin was first increased to 38 mg a week then 40 mg a week.  On 07/30/2016, she was increased to 6 mg every 5 days/week and 5 mg 2 days/week (Thursdays and Sundays).  Symptomatically, she denies any complaint.  She denies any bruising or bleeding.     Past Medical History:  Diagnosis Date  . Arthritis   . Collagen vascular disease ([place])   . Deep venous thrombosis (H[facility])    right lower extremity  . Dependent edema   . History of bilateral hip replacements   . History of hysterectomy   . Hypertension   . Polycythemia vera ([place])   . Stroke [facility])    Pt reports she was told she had a stroke in may    Past Surgical History:  Procedure Laterality Date  . ABDOMINAL HYSTERECTOMY    . CARDIAC CATHETERIZATION Right 04/03/2015   Procedure: CENTRAL LINE  INSERTION;  Surgeon: Sherri Rad, MD;  Location: [facility] ORS;  Service: General;  Laterality: Right;  . COLECTOMY WITH COLOSTOMY CREATION/HARTMANN PROCEDURE N/A 04/03/2015   Procedure: COLECTOMY WITH COLOSTOMY CREATION/HARTMANN PROCEDURE;  Surgeon: Sherri Rad, MD;  Location: [facility] ORS;  Service: General;  Laterality: N/A;  . JOINT REPLACEMENT     Left and Right Hip  . PERIPHERAL VASCULAR CATHETERIZATION N/A 04/02/2015   Procedure: Visceral Angiography;  Surgeon: Algernon Huxley, MD;  Location: [place] [facility];  Service: Cardiovascular;  Laterality: N/A;  . PERIPHERAL VASCULAR CATHETERIZATION N/A 04/02/2015   Procedure: Visceral Artery Intervention;  Surgeon: Algernon Huxley, MD;  Location: [place] [facility];  Service: Cardiovascular;  Laterality: N/A;  . PERIPHERAL VASCULAR CATHETERIZATION N/A 05/25/2015   Procedure: IVC Filter Insertion;  Surgeon: Katha Cabal, MD;  Location: [place] [facility];  Service: Cardiovascular;  Laterality: N/A;  . TOE AMPUTATION     right    Family History  Problem Relation Age of Onset  . Heart attack Father   . Hypertension    . Arthritis-Osteo    . COPD Father   . Diabetes Brother   . Osteosarcoma Son     Social History:  reports that she has never smoked. She has never used smokeless tobacco. She reports that she does  not drink alcohol or use drugs.  She lives alone on [address] at [place] (independent living). She had a great trip to [place] where she visited family.  Her grand-daughter visits regularly Yolanda Wells [phone]).  The patient is accompanied by her grand-daughter today.  Allergies: No Known Allergies  Current Medications: Current Outpatient Prescriptions  Medication Sig Dispense Refill  . aspirin EC 81 MG tablet Take 1 tablet (81 mg total) by mouth daily. 30 tablet 2  . docusate sodium (COLACE) 100 MG capsule Take 100 mg by mouth 2 (two) times daily as needed for mild constipation.     . folic acid (FOLVITE) 833 MCG tablet Take  400 mcg by mouth daily.    [name] ipratropium-albuterol (DUONEB) 0.5-2.5 (3) MG/3ML SOLN Take 3 mLs by nebulization every 4 (four) hours. 360 mL 6  . menthol-cetylpyridinium (CEPACOL) 3 MG lozenge Take 1 lozenge (3 mg total) by mouth as needed for sore throat. 100 tablet 12  . Polyethylene Glycol POWD Take 17 g by mouth daily. 527 g 5  . potassium chloride SA (K-DUR,KLOR-CON) 20 MEQ tablet Take 1 tablet (20 mEq total) by mouth 2 (two) times daily. 30 tablet 0  . hydroxyurea (HYDREA) 500 MG capsule Pt takes 4 capsules on Wed, Saturday, and Sunday,take 3 capsules on Monday, Tues Thurs, and Friday. (Patient not taking: Reported on 08/05/2016) 96 capsule 3  . hydroxyurea (HYDREA) 500 MG capsule Take 4 capsules 5 days a week and 3 capsules 2 days a week (Patient not taking: Reported on 08/05/2016) 100 capsule 0  . warfarin (COUMADIN) 1 MG tablet Take 1 tab (1 MG) with a 5 mg tab every Friday for a total dose of 6 mg every Friday. Take a 5 mg tablet Sat - Thursday (Patient not taking: Reported on 08/05/2016) 15 tablet 1  . warfarin (COUMADIN) 5 MG tablet Take 1 tablet (5 mg total) by mouth daily. Take 5 mg Sat - Thursday and take 6 mg every Friday (Patient not taking: Reported on 08/05/2016) 30 tablet 2   No current facility-administered medications for this visit.     Review of Systems:  GENERAL:  Feels fine.  No fevers or sweats.  Weight stable. PERFORMANCE STATUS (ECOG):  3 HEENT:  Scleral hemorrhage.  No visual changes, runny nose, sore throat, mouth sores or tenderness. Lungs:  No shortness of breath or cough.  No hemoptysis. Cardiac:  No chest pain, palpitations, orthopnea, or PND. GI:  No nausea, vomiting, diarrhea, constipation, melena or hematochezia. GU:  No urgency, frequency, dysuria, or hematuria.  Wears adult diapers. Musculoskeletal:  Knee problems ("bone on bone").  No muscle tenderness. Extremities:  Chronic swelling in legs. Skin:  Dry skin.  No rashes or skin changes. Neuro:  No headache,  numbness or weakness, balance or coordination issues. Endocrine:  No diabetes, thyroid issues, hot flashes or night sweats. Psych:  No mood changes, depression or anxiety. Pain:  No focal pain. Review of systems:  All other systems reviewed and found to be negative.  Physical Exam: Blood pressure (!) 159/96, pulse 75, temperature 97.4 F (36.3 C), temperature source Tympanic, weight 209 lb (94.8 kg), SpO2 96 %. GENERAL:  Elderly woman sitting comfortably in the exam room in no acute distress. MENTAL STATUS:  Alert and oriented to person, place and time. HEAD: Pearline Cables hair.  Normocephalic, atraumatic, face symmetric, no Cushingoid features. EYES:  Brown eyes.  Right scleral hemorrhage.  Pupils equal round and reactive to light and accomodation.  No conjunctivitis or scleral  icterus. ENT:  Oropharynx clear without lesion.  Upper dentures.  Tongue normal. Mucous membranes moist.  RESPIRATORY:  Clear to auscultation without rales, wheezes or rhonchi. CARDIOVASCULAR:  Regular rate and rhythm without murmur, rub or gallop. ABDOMEN:  Left sided colostomy.  Soft, non-tender, with active bowel sounds, and no appreciable hepatosplenomegaly.  No masses. SKIN:  Right sided small tender nodule in groin area. EXTREMITIES:  Mild left leg edema.  No skin discoloration or tenderness.  No palpable cords. LYMPH NODES: No palpable cervical, supraclavicular, axillary or inguinal adenopathy  NEUROLOGICAL: Unremarkable. PSYCH:  Appropriate.   Appointment on 08/05/2016  Component Date Value Ref Range Status  . WBC 08/05/2016 3.0* 3.6 - 11.0 K/uL Final  . RBC 08/05/2016 4.72  3.80 - 5.20 MIL/uL Final  . Hemoglobin 08/05/2016 14.4  12.0 - 16.0 g/dL Final  . HCT 08/05/2016 43.1  35.0 - 47.0 % Final  . MCV 08/05/2016 91.4  80.0 - 100.0 fL Final  . MCH 08/05/2016 30.4  26.0 - 34.0 pg Final  . MCHC 08/05/2016 33.3  32.0 - 36.0 g/dL Final  . RDW 08/05/2016 32.1* 11.5 - 14.5 % Final  . Platelets 08/05/2016 753* 150 -  440 K/uL Final  . Neutrophils Relative % 08/05/2016 64  % Final  . Neutro Abs 08/05/2016 1.9  1.4 - 6.5 K/uL Final  . Lymphocytes Relative 08/05/2016 29  % Final  . Lymphs Abs 08/05/2016 0.9* 1.0 - 3.6 K/uL Final  . Monocytes Relative 08/05/2016 4  % Final  . Monocytes Absolute 08/05/2016 0.1* 0.2 - 0.9 K/uL Final  . Eosinophils Relative 08/05/2016 2  % Final  . Eosinophils Absolute 08/05/2016 0.0  0 - 0.7 K/uL Final  . Basophils Relative 08/05/2016 1  % Final  . Basophils Absolute 08/05/2016 0.0  0 - 0.1 K/uL Final  . Sodium 08/05/2016 137  135 - 145 mmol/L Final  . Potassium 08/05/2016 4.4  3.5 - 5.1 mmol/L Final  . Chloride 08/05/2016 105  101 - 111 mmol/L Final  . CO2 08/05/2016 26  22 - 32 mmol/L Final  . Glucose, Bld 08/05/2016 133* 65 - 99 mg/dL Final  . BUN 08/05/2016 10  6 - 20 mg/dL Final  . Creatinine, Ser 08/05/2016 0.60  0.44 - 1.00 mg/dL Final  . Calcium 08/05/2016 9.0  8.9 - 10.3 mg/dL Final  . Total Protein 08/05/2016 7.5  6.5 - 8.1 g/dL Final  . Albumin 08/05/2016 3.1* 3.5 - 5.0 g/dL Final  . AST 08/05/2016 19  15 - 41 U/L Final  . ALT 08/05/2016 9* 14 - 54 U/L Final  . Alkaline Phosphatase 08/05/2016 76  38 - 126 U/L Final  . Total Bilirubin 08/05/2016 0.9  0.3 - 1.2 mg/dL Final  . GFR calc non Af Amer 08/05/2016 >60  >60 mL/min Final  . GFR calc Af Amer 08/05/2016 >60  >60 mL/min Final   Comment: (NOTE) The eGFR has been calculated using the CKD EPI equation. This calculation has not been validated in all clinical situations. eGFR's persistently <60 mL/min signify possible Chronic Kidney Disease.   . Anion gap 08/05/2016 6  5 - 15 Final  . Prothrombin Time 08/05/2016 34.3* 11.4 - 15.2 seconds Final  . INR 08/05/2016 3.30   Final    Assessment:  Lagina Reader is a 82 y.o. female African-American woman with JAK2+ polycythemia rubra vera (PV) previously on a phlebotomy program and hydroxyurea. She received P32 in an attempt to manage her counts in 03/2015.    Course  has been complicated by a cerebellar CVA on 04/01/2015, splenic flexure bleeding requiring micro-embolization then colectomy on 04/03/2015. She was diagnosed with bilateral lower extremity DVTs on 05/18/2015 and bilateral pulmonary emboli on 05/24/2015. She underwent IVC filter placement on 05/25/2015.  She is on a fluctuating dose of Coumadin (total weekly dose 36 mg).  She is on a baby aspirin.  She developed progressive erythrocytosis, thrombocytosis, and leukocytosis.  She underwent phlebotomy for a hematocrit of 55.4 on 09/23/2015.  Hematocrit decreased to 50.0, but has again increased after initiation of oral iron.  Platelet count has increased from 1.1 million to 1.4 million.  White count increased from 23,000 - 28,000 to 31,800.  She has been back on hydroxyurea since 10/02/2015.  Initial dose was 1000 mg a day.  She is currently taking 3 pills 7 days/week (total weekly dose: 21 pills).   She requires periodic phlebotomies (goal hematocrit 42).  Platelet count remains elevated (secondary to PV and likely some component of iron deficiency).  Goal platelet count is 400,000.  Symptomatically, she feels good.  Exam reveals no appreciable hepatosplenomegaly.  Hematocrit is 43.1.  Platelet count is 753,000.  WBC is 3,000 ([place] 1900).  INR is 3.3 on Coumadin 6 mg 5 days a week and 5 mg 2 days a week (total weekly dose 40 mg).  Plan: 1.  Labs today:  CBC with diff, CMP, PT/INR. 2.  Phlebotomy 300 cc today. 3.  Decrease Coumadin to 5 mg 5 days/week and 6 mg 2 days/week (total weekly dose 37 mg). 4.  Increase hydroxyurea to 2000 mg 3 days/week and 1500 mg 4 days/week (total weekly dose: 24 pills). 5.  Continue baby aspirin. 6.  Discuss issues with hydroxyurea and balancing platelelet count and WBC count.  Per grand-daughter same issues at [facility].  May need to switch to anagrelide.   7.  RTC weekly for labs (CBC with diff, PT/INR). 8.  RTC in 1 month for MD assessment and labs (CBC with diff,  CMP, PT/INR) +/- phlebotomy   Lequita Asal, MD  08/05/2016, 10:47 AM

## 2016-08-05 ENCOUNTER — Encounter: Payer: Self-pay | Admitting: Hematology and Oncology

## 2016-08-05 ENCOUNTER — Encounter: Payer: Self-pay | Admitting: *Deleted

## 2016-08-05 ENCOUNTER — Inpatient Hospital Stay (HOSPITAL_BASED_OUTPATIENT_CLINIC_OR_DEPARTMENT_OTHER): Payer: Medicare Other | Admitting: Hematology and Oncology

## 2016-08-05 ENCOUNTER — Inpatient Hospital Stay: Payer: Medicare Other

## 2016-08-05 VITALS — BP 163/92 | HR 75 | Temp 97.4°F | Wt 209.0 lb

## 2016-08-05 DIAGNOSIS — D45 Polycythemia vera: Secondary | ICD-10-CM | POA: Diagnosis not present

## 2016-08-05 DIAGNOSIS — I82409 Acute embolism and thrombosis of unspecified deep veins of unspecified lower extremity: Secondary | ICD-10-CM

## 2016-08-05 DIAGNOSIS — Z86718 Personal history of other venous thrombosis and embolism: Secondary | ICD-10-CM

## 2016-08-05 DIAGNOSIS — M1990 Unspecified osteoarthritis, unspecified site: Secondary | ICD-10-CM | POA: Diagnosis not present

## 2016-08-05 DIAGNOSIS — Z7901 Long term (current) use of anticoagulants: Secondary | ICD-10-CM | POA: Diagnosis not present

## 2016-08-05 DIAGNOSIS — Z79899 Other long term (current) drug therapy: Secondary | ICD-10-CM

## 2016-08-05 DIAGNOSIS — Z7982 Long term (current) use of aspirin: Secondary | ICD-10-CM

## 2016-08-05 DIAGNOSIS — Z8673 Personal history of transient ischemic attack (TIA), and cerebral infarction without residual deficits: Secondary | ICD-10-CM

## 2016-08-05 DIAGNOSIS — I10 Essential (primary) hypertension: Secondary | ICD-10-CM | POA: Diagnosis not present

## 2016-08-05 LAB — CBC WITH DIFFERENTIAL/PLATELET
Basophils Absolute: 0 10*3/uL (ref 0–0.1)
Basophils Relative: 1 %
Eosinophils Absolute: 0 10*3/uL (ref 0–0.7)
Eosinophils Relative: 2 %
HCT: 43.1 % (ref 35.0–47.0)
Hemoglobin: 14.4 g/dL (ref 12.0–16.0)
Lymphocytes Relative: 29 %
Lymphs Abs: 0.9 10*3/uL — ABNORMAL LOW (ref 1.0–3.6)
MCH: 30.4 pg (ref 26.0–34.0)
MCHC: 33.3 g/dL (ref 32.0–36.0)
MCV: 91.4 fL (ref 80.0–100.0)
Monocytes Absolute: 0.1 10*3/uL — ABNORMAL LOW (ref 0.2–0.9)
Monocytes Relative: 4 %
Neutro Abs: 1.9 10*3/uL (ref 1.4–6.5)
Neutrophils Relative %: 64 %
Platelets: 753 10*3/uL — ABNORMAL HIGH (ref 150–440)
RBC: 4.72 MIL/uL (ref 3.80–5.20)
RDW: 32.1 % — ABNORMAL HIGH (ref 11.5–14.5)
WBC: 3 10*3/uL — ABNORMAL LOW (ref 3.6–11.0)

## 2016-08-05 LAB — COMPREHENSIVE METABOLIC PANEL
ALT: 9 U/L — ABNORMAL LOW (ref 14–54)
AST: 19 U/L (ref 15–41)
Albumin: 3.1 g/dL — ABNORMAL LOW (ref 3.5–5.0)
Alkaline Phosphatase: 76 U/L (ref 38–126)
Anion gap: 6 (ref 5–15)
BUN: 10 mg/dL (ref 6–20)
CO2: 26 mmol/L (ref 22–32)
Calcium: 9 mg/dL (ref 8.9–10.3)
Chloride: 105 mmol/L (ref 101–111)
Creatinine, Ser: 0.6 mg/dL (ref 0.44–1.00)
GFR calc Af Amer: >60 mL/min (ref >60)
GFR calc non Af Amer: >60 mL/min (ref >60)
Glucose, Bld: 133 mg/dL — ABNORMAL HIGH (ref 65–99)
Potassium: 4.4 mmol/L (ref 3.5–5.1)
Sodium: 137 mmol/L (ref 135–145)
Total Bilirubin: 0.9 mg/dL (ref 0.3–1.2)
Total Protein: 7.5 g/dL (ref 6.5–8.1)

## 2016-08-05 LAB — PROTIME-INR
INR: 3.3
Prothrombin Time: 34.3 seconds — ABNORMAL HIGH (ref 11.4–15.2)

## 2016-08-05 NOTE — Progress Notes (Signed)
Patient is here for follow up, she declines flu shot. She also has redness in right eye due to burst blood vessel. BP is high granddaughter says she is sure her bp is high.  Hydrea=is three capsules daily  Warfarin= is 6mg  on Sunday,mon,wednesday,thursday and Saturday 5mg  on Tuesday and friday

## 2016-08-06 ENCOUNTER — Other Ambulatory Visit: Payer: Self-pay | Admitting: *Deleted

## 2016-08-06 DIAGNOSIS — I82409 Acute embolism and thrombosis of unspecified deep veins of unspecified lower extremity: Secondary | ICD-10-CM

## 2016-08-06 DIAGNOSIS — I2699 Other pulmonary embolism without acute cor pulmonale: Secondary | ICD-10-CM

## 2016-08-06 DIAGNOSIS — D45 Polycythemia vera: Secondary | ICD-10-CM

## 2016-08-12 ENCOUNTER — Inpatient Hospital Stay: Payer: Medicare Other | Attending: Hematology and Oncology

## 2016-08-12 ENCOUNTER — Telehealth: Payer: Self-pay | Admitting: *Deleted

## 2016-08-12 DIAGNOSIS — Z7901 Long term (current) use of anticoagulants: Secondary | ICD-10-CM | POA: Diagnosis not present

## 2016-08-12 DIAGNOSIS — D45 Polycythemia vera: Secondary | ICD-10-CM | POA: Diagnosis present

## 2016-08-12 DIAGNOSIS — Z8673 Personal history of transient ischemic attack (TIA), and cerebral infarction without residual deficits: Secondary | ICD-10-CM | POA: Diagnosis not present

## 2016-08-12 DIAGNOSIS — I2699 Other pulmonary embolism without acute cor pulmonale: Secondary | ICD-10-CM

## 2016-08-12 DIAGNOSIS — Z86718 Personal history of other venous thrombosis and embolism: Secondary | ICD-10-CM | POA: Insufficient documentation

## 2016-08-12 DIAGNOSIS — Z79899 Other long term (current) drug therapy: Secondary | ICD-10-CM | POA: Diagnosis not present

## 2016-08-12 DIAGNOSIS — R600 Localized edema: Secondary | ICD-10-CM | POA: Insufficient documentation

## 2016-08-12 DIAGNOSIS — Z5181 Encounter for therapeutic drug level monitoring: Secondary | ICD-10-CM | POA: Insufficient documentation

## 2016-08-12 DIAGNOSIS — I10 Essential (primary) hypertension: Secondary | ICD-10-CM | POA: Diagnosis not present

## 2016-08-12 DIAGNOSIS — I82409 Acute embolism and thrombosis of unspecified deep veins of unspecified lower extremity: Secondary | ICD-10-CM

## 2016-08-12 DIAGNOSIS — H5461 Unqualified visual loss, right eye, normal vision left eye: Secondary | ICD-10-CM | POA: Diagnosis not present

## 2016-08-12 DIAGNOSIS — Z7982 Long term (current) use of aspirin: Secondary | ICD-10-CM | POA: Diagnosis not present

## 2016-08-12 LAB — PROTIME-INR
INR: 3.76
Prothrombin Time: 38.1 seconds — ABNORMAL HIGH (ref 11.4–15.2)

## 2016-08-12 NOTE — Telephone Encounter (Signed)
Called granddaughter, [name], who verified patient has been taking Coumadin 5 mg/5 days a week and 6 mg/2 days a week.  Informed her INR continues to rise.  MD recommends patient should take 5 mg/5 days a week and 4 mg/2 days a week. To begin 4 mg today.  [name] verbalized understanding.

## 2016-08-19 ENCOUNTER — Other Ambulatory Visit: Payer: Self-pay

## 2016-08-19 ENCOUNTER — Inpatient Hospital Stay: Payer: Medicare Other

## 2016-08-19 ENCOUNTER — Telehealth: Payer: Self-pay | Admitting: *Deleted

## 2016-08-19 DIAGNOSIS — D45 Polycythemia vera: Secondary | ICD-10-CM

## 2016-08-19 DIAGNOSIS — I2699 Other pulmonary embolism without acute cor pulmonale: Secondary | ICD-10-CM

## 2016-08-19 DIAGNOSIS — I82409 Acute embolism and thrombosis of unspecified deep veins of unspecified lower extremity: Secondary | ICD-10-CM

## 2016-08-19 LAB — PROTIME-INR
INR: 2.84
Prothrombin Time: 30.4 seconds — ABNORMAL HIGH (ref 11.4–15.2)

## 2016-08-19 NOTE — Telephone Encounter (Signed)
-----   Message from Lequita Asal, MD sent at 08/19/2016 12:17 PM EDT ----- Regarding: Please call patient  INR good.  Same dose Coumadin. Check in 1 week.  M ----- Message ----- From: Interface, Lab In [place] Sent: 08/19/2016  11:13 AM To: Lequita Asal, MD

## 2016-08-19 NOTE — Telephone Encounter (Signed)
Called patient's granddaughter, [name] and lvm that INR is good.  MD recommends continuing same dose of coumadin and recheck labs in one week.

## 2016-08-25 ENCOUNTER — Other Ambulatory Visit: Payer: Self-pay

## 2016-08-25 ENCOUNTER — Observation Stay
Admission: EM | Admit: 2016-08-25 | Discharge: 2016-08-26 | Disposition: A | Discharge status: Home / Self Care | Payer: Medicare Other | Attending: Internal Medicine | Admitting: Internal Medicine

## 2016-08-25 DIAGNOSIS — Z7901 Long term (current) use of anticoagulants: Secondary | ICD-10-CM | POA: Diagnosis not present

## 2016-08-25 DIAGNOSIS — Z833 Family history of diabetes mellitus: Secondary | ICD-10-CM | POA: Diagnosis not present

## 2016-08-25 DIAGNOSIS — Z96643 Presence of artificial hip joint, bilateral: Secondary | ICD-10-CM | POA: Insufficient documentation

## 2016-08-25 DIAGNOSIS — G459 Transient cerebral ischemic attack, unspecified: Secondary | ICD-10-CM

## 2016-08-25 DIAGNOSIS — Z825 Family history of asthma and other chronic lower respiratory diseases: Secondary | ICD-10-CM | POA: Diagnosis not present

## 2016-08-25 DIAGNOSIS — Z89421 Acquired absence of other right toe(s): Secondary | ICD-10-CM | POA: Diagnosis not present

## 2016-08-25 DIAGNOSIS — Z7982 Long term (current) use of aspirin: Secondary | ICD-10-CM | POA: Diagnosis not present

## 2016-08-25 DIAGNOSIS — Z8673 Personal history of transient ischemic attack (TIA), and cerebral infarction without residual deficits: Secondary | ICD-10-CM | POA: Insufficient documentation

## 2016-08-25 DIAGNOSIS — I672 Cerebral atherosclerosis: Secondary | ICD-10-CM | POA: Diagnosis not present

## 2016-08-25 DIAGNOSIS — Z90710 Acquired absence of both cervix and uterus: Secondary | ICD-10-CM | POA: Insufficient documentation

## 2016-08-25 DIAGNOSIS — H5461 Unqualified visual loss, right eye, normal vision left eye: Principal | ICD-10-CM | POA: Insufficient documentation

## 2016-08-25 DIAGNOSIS — M1990 Unspecified osteoarthritis, unspecified site: Secondary | ICD-10-CM | POA: Insufficient documentation

## 2016-08-25 DIAGNOSIS — Z8249 Family history of ischemic heart disease and other diseases of the circulatory system: Secondary | ICD-10-CM | POA: Diagnosis not present

## 2016-08-25 DIAGNOSIS — D45 Polycythemia vera: Secondary | ICD-10-CM | POA: Diagnosis not present

## 2016-08-25 DIAGNOSIS — N390 Urinary tract infection, site not specified: Secondary | ICD-10-CM | POA: Insufficient documentation

## 2016-08-25 DIAGNOSIS — Z86718 Personal history of other venous thrombosis and embolism: Secondary | ICD-10-CM | POA: Insufficient documentation

## 2016-08-25 DIAGNOSIS — R262 Difficulty in walking, not elsewhere classified: Secondary | ICD-10-CM

## 2016-08-25 DIAGNOSIS — M6281 Muscle weakness (generalized): Secondary | ICD-10-CM

## 2016-08-25 DIAGNOSIS — M359 Systemic involvement of connective tissue, unspecified: Secondary | ICD-10-CM | POA: Insufficient documentation

## 2016-08-25 DIAGNOSIS — R609 Edema, unspecified: Secondary | ICD-10-CM | POA: Diagnosis not present

## 2016-08-25 DIAGNOSIS — I10 Essential (primary) hypertension: Secondary | ICD-10-CM | POA: Insufficient documentation

## 2016-08-25 DIAGNOSIS — I6523 Occlusion and stenosis of bilateral carotid arteries: Secondary | ICD-10-CM | POA: Insufficient documentation

## 2016-08-25 DIAGNOSIS — H5440 Blindness, one eye, unspecified eye: Secondary | ICD-10-CM | POA: Diagnosis present

## 2016-08-25 MED ORDER — TETRACAINE HCL 0.5 % OP SOLN
OPHTHALMIC | Status: AC
  Administered 2016-08-25: 1 [drp] via OPHTHALMIC
  Filled 2016-08-25: qty 2

## 2016-08-25 MED ORDER — TETRACAINE HCL 0.5 % OP SOLN
1.0000 [drp] | Freq: Once | OPHTHALMIC | Status: AC
  Administered 2016-08-25: 1 [drp] via OPHTHALMIC

## 2016-08-25 NOTE — ED Triage Notes (Signed)
Pt arrives to ED from [facility] with c/o eye pain. Pt reports waking up tonight and felt like something was in her eye; pt reports rubbing it and her vision "wernt black". Pt reports it was the RT eye, pt w/o c/o pain ATT.

## 2016-08-26 ENCOUNTER — Other Ambulatory Visit: Payer: Self-pay | Admitting: Radiology

## 2016-08-26 ENCOUNTER — Inpatient Hospital Stay: Payer: Medicare Other

## 2016-08-26 ENCOUNTER — Emergency Department: Payer: Medicare Other

## 2016-08-26 ENCOUNTER — Observation Stay: Payer: Medicare Other

## 2016-08-26 ENCOUNTER — Observation Stay
Admit: 2016-08-26 | Discharge: 2016-08-26 | Disposition: A | Discharge status: Home / Self Care | Payer: Medicare Other | Attending: Internal Medicine | Admitting: Internal Medicine

## 2016-08-26 DIAGNOSIS — H3411 Central retinal artery occlusion, right eye: Secondary | ICD-10-CM | POA: Diagnosis not present

## 2016-08-26 DIAGNOSIS — H5461 Unqualified visual loss, right eye, normal vision left eye: Secondary | ICD-10-CM | POA: Diagnosis not present

## 2016-08-26 DIAGNOSIS — H5440 Blindness, one eye, unspecified eye: Secondary | ICD-10-CM | POA: Diagnosis present

## 2016-08-26 LAB — BASIC METABOLIC PANEL WITH GFR
Anion gap: 6 (ref 5–15)
BUN: 16 mg/dL (ref 6–20)
CO2: 24 mmol/L (ref 22–32)
Calcium: 8.7 mg/dL — ABNORMAL LOW (ref 8.9–10.3)
Chloride: 106 mmol/L (ref 101–111)
Creatinine, Ser: 0.69 mg/dL (ref 0.44–1.00)
GFR calc Af Amer: >60 mL/min
GFR calc non Af Amer: >60 mL/min
Glucose, Bld: 106 mg/dL — ABNORMAL HIGH (ref 65–99)
Potassium: 4.3 mmol/L (ref 3.5–5.1)
Sodium: 136 mmol/L (ref 135–145)

## 2016-08-26 LAB — CBC
HCT: 40 % (ref 35.0–47.0)
Hemoglobin: 13.5 g/dL (ref 12.0–16.0)
MCH: 30.8 pg (ref 26.0–34.0)
MCHC: 33.8 g/dL (ref 32.0–36.0)
MCV: 91 fL (ref 80.0–100.0)
Platelets: 842 K/uL — ABNORMAL HIGH (ref 150–440)
RBC: 4.39 MIL/uL (ref 3.80–5.20)
RDW: 26.9 % — ABNORMAL HIGH (ref 11.5–14.5)
WBC: 4.9 K/uL (ref 3.6–11.0)

## 2016-08-26 LAB — C-REACTIVE PROTEIN

## 2016-08-26 LAB — MRSA PCR SCREENING: MRSA BY PCR: NEGATIVE

## 2016-08-26 LAB — LIPID PANEL
CHOL/HDL RATIO: 2.9 ratio
CHOLESTEROL: 82 mg/dL (ref 0–200)
HDL: 28 mg/dL — AB (ref >40)
LDL Cholesterol: 43 mg/dL (ref 0–99)
Triglycerides: 53 mg/dL (ref <150)
VLDL: 11 mg/dL (ref 0–40)

## 2016-08-26 LAB — TROPONIN I: Troponin I: <0.03 ng/mL

## 2016-08-26 LAB — PROTIME-INR
INR: 2.59
Prothrombin Time: 28.3 seconds — ABNORMAL HIGH (ref 11.4–15.2)

## 2016-08-26 LAB — APTT: APTT: 45 s — AB (ref 24–36)

## 2016-08-26 LAB — SEDIMENTATION RATE: Sed Rate: 11 mm/hr (ref 0–30)

## 2016-08-26 MED ORDER — LEVOFLOXACIN IN D5W 750 MG/150ML IV SOLN
750.0000 mg | Freq: Once | INTRAVENOUS | Status: AC
  Administered 2016-08-26: 750 mg via INTRAVENOUS
  Filled 2016-08-26: qty 150

## 2016-08-26 MED ORDER — IPRATROPIUM-ALBUTEROL 0.5-2.5 (3) MG/3ML IN SOLN: 3.0000 mL | RESPIRATORY_TRACT | Status: DC | PRN

## 2016-08-26 MED ORDER — ASPIRIN EC 81 MG PO TBEC
81.0000 mg | DELAYED_RELEASE_TABLET | Freq: Every day | ORAL | Status: DC
  Administered 2016-08-26: 11:00:00 81 mg via ORAL
  Filled 2016-08-26: qty 1

## 2016-08-26 MED ORDER — IPRATROPIUM-ALBUTEROL 0.5-2.5 (3) MG/3ML IN SOLN
3.0000 mL | RESPIRATORY_TRACT | Status: DC
  Filled 2016-08-26: qty 3

## 2016-08-26 MED ORDER — FOLIC ACID 400 MCG PO TABS: 400.0000 ug | ORAL_TABLET | Freq: Every day | ORAL | Status: DC

## 2016-08-26 MED ORDER — HYDROXYUREA 500 MG PO CAPS
2000.0000 mg | ORAL_CAPSULE | ORAL | Status: DC
Start: 2016-08-27 — End: 2016-08-26

## 2016-08-26 MED ORDER — LEVOFLOXACIN IN D5W 750 MG/150ML IV SOLN
750.0000 mg | INTRAVENOUS | Status: DC
  Filled 2016-08-26: qty 150

## 2016-08-26 MED ORDER — FOLIC ACID 1 MG PO TABS
0.5000 mg | ORAL_TABLET | Freq: Every day | ORAL | Status: DC
  Administered 2016-08-26: 11:00:00 0.5 mg via ORAL
  Filled 2016-08-26: qty 1

## 2016-08-26 MED ORDER — STROKE: EARLY STAGES OF RECOVERY BOOK
Freq: Once | Status: AC
  Administered 2016-08-26: 05:00:00

## 2016-08-26 MED ORDER — WARFARIN SODIUM 6 MG PO TABS
6.0000 mg | ORAL_TABLET | Freq: Every day | ORAL | Status: DC
  Filled 2016-08-26: qty 1

## 2016-08-26 MED ORDER — HYDROXYUREA 500 MG PO CAPS
1000.0000 mg | ORAL_CAPSULE | Freq: Every day | ORAL | Status: DC
  Administered 2016-08-26: 11:00:00 1000 mg via ORAL
  Filled 2016-08-26: qty 2

## 2016-08-26 MED ORDER — HYDROXYUREA 500 MG PO CAPS
1500.0000 mg | ORAL_CAPSULE | ORAL | Status: DC
  Filled 2016-08-26: qty 3

## 2016-08-26 MED ORDER — CEFUROXIME AXETIL 500 MG PO TABS: 500.0000 mg | ORAL_TABLET | Freq: Two times a day (BID) | ORAL | 0 refills | Status: DC

## 2016-08-26 MED ORDER — SODIUM CHLORIDE 0.9 % IV SOLN
INTRAVENOUS | Status: DC
  Administered 2016-08-26: 05:00:00 via INTRAVENOUS

## 2016-08-26 MED ORDER — DOCUSATE SODIUM 100 MG PO CAPS
100.0000 mg | ORAL_CAPSULE | Freq: Two times a day (BID) | ORAL | Status: DC | PRN
  Administered 2016-08-26: 100 mg via ORAL
  Filled 2016-08-26: qty 1

## 2016-08-26 NOTE — Plan of Care (Signed)
Problem: Tissue Perfusion: Goal: Complications of Ischemic Stroke will be minimized (choose ONE based on patient diagnosis) Outcome: Progressing Pt admitted this am after problems with right eye vision. NIH=2. Stroke booklet given and pt allowed time to ask additional questions.

## 2016-08-26 NOTE — Progress Notes (Signed)
Pharmacy Antibiotic Note  Yolanda Wells is a 82 y.o. female admitted on 08/25/2016 with UTI.  Pharmacy has been consulted for levofloxacin dosing.  Plan: Levaquin 750 mg q 24 hours ordered.  Height: 5\' 10"  (177.8 cm) Weight: 209 lb (94.8 kg) IBW/kg (Calculated) : 68.5  Temp (24hrs), Avg:98.3 F (36.8 C), Min:98.1 F (36.7 C), Max:98.5 F (36.9 C)   Recent Labs Lab 08/25/16 2351  WBC 4.9  CREATININE 0.69    Estimated Creatinine Clearance: 67.6 mL/min (by C-G formula based on SCr of 0.69 mg/dL).    No Known Allergies  Antimicrobials this admission: Levofloxacin  >>    >>   Dose adjustments this admission:   Microbiology results: No micro     Thank you for allowing pharmacy to be a part of this patient's care.  Candiss Galeana S 08/26/2016 4:21 AM

## 2016-08-26 NOTE — ED Provider Notes (Signed)
[facility] Emergency Department Provider Note   ____________________________________________   First MD Initiated Contact with Patient 08/25/16 2336     (approximate)  I have reviewed the triage vital signs and the nursing notes.   HISTORY  Chief Complaint Eye Problem    HPI Yolanda Wells is a 82 y.o. female who comes into the hospital today with some right sided vision loss. The patient reports that she woke up and felt like something was in her eye.The patient reports that she rubbed her eye and anything went black in that eye. The patient's pupil is round and reactive according to EMS. The patient has had surgery on her eyelid before. She lives in an assisted living facility. The patient denies any pain to her eyes. She reports that it felt like a shade was pulled halfway down her eye. The patient denies any headache or blurred vision on the left side. This occurred around 11 PM. The patient denies any chest pain, shortness of breath, abdominal pain, weakness, numbness, tingling. The patient is here for evaluation of her eye.   Past Medical History:  Diagnosis Date  . Arthritis   . Collagen vascular disease ([place])   . Deep venous thrombosis (H[facility])    right lower extremity  . Dependent edema   . History of bilateral hip replacements   . History of hysterectomy   . Hypertension   . Polycythemia vera ([place])   . Stroke [facility])    Pt reports she was told she had a stroke in may    Patient Active Problem List   Diagnosis Date Noted  . Cannot walk 10/12/2015  . Presence of colostomy ([place]) 10/04/2015  . Long term current use of anticoagulant 10/03/2015  . Pulmonary embolism ([place]) 10/03/2015  . Leukocytosis 09/25/2015  . Chest pain 09/25/2015  . COPD exacerbation ([place]) 09/24/2015  . Right lower lobe pneumonia ([place]) 09/24/2015  . Chronic obstructive pulmonary disease with acute exacerbation ([place]) 09/24/2015  . Dyspnea 09/22/2015  . Elevated troponin  09/15/2015  . UTI (lower urinary tract infection) 07/16/2015  . Abdominal wall cellulitis 07/15/2015  . DVT (deep venous thrombosis) ([place]) 07/15/2015  . HTN (hypertension) 07/15/2015  . Polycythemia vera ([place]) 07/15/2015  . Dependent edema 07/15/2015  . Arthritis 07/15/2015  . Pulmonary emboli ([place]) 05/25/2015  . Diverticulosis of colon with hemorrhage 04/02/2015  . Diverticula of intestine 04/02/2015  . Dehydration, moderate 01/11/2015  . Fall 01/10/2015  . BP (high blood pressure) 01/10/2015  . Hammer toe 09/02/2012  . History of amputation of foot ([place]) 09/02/2012  . Fungal infection of nail 09/02/2012  . Disease of skin and subcutaneous tissue 09/02/2012  . Pain in toe 09/02/2012  . Arthralgia of hip 08/23/2012  . History of repair of hip joint 08/23/2012  . Glenohumeral arthritis 06/28/2012    Past Surgical History:  Procedure Laterality Date  . ABDOMINAL HYSTERECTOMY    . CARDIAC CATHETERIZATION Right 04/03/2015   Procedure: CENTRAL LINE INSERTION;  Surgeon: Sherri Rad, MD;  Location: [facility] ORS;  Service: General;  Laterality: Right;  . COLECTOMY WITH COLOSTOMY CREATION/HARTMANN PROCEDURE N/A 04/03/2015   Procedure: COLECTOMY WITH COLOSTOMY CREATION/HARTMANN PROCEDURE;  Surgeon: Sherri Rad, MD;  Location: [facility] ORS;  Service: General;  Laterality: N/A;  . JOINT REPLACEMENT     Left and Right Hip  . PERIPHERAL VASCULAR CATHETERIZATION N/A 04/02/2015   Procedure: Visceral Angiography;  Surgeon: Algernon Huxley, MD;  Location: [facility];  Service: Cardiovascular;  Laterality: N/A;  .  PERIPHERAL VASCULAR CATHETERIZATION N/A 04/02/2015   Procedure: Visceral Artery Intervention;  Surgeon: Algernon Huxley, MD;  Location: [place] [facility];  Service: Cardiovascular;  Laterality: N/A;  . PERIPHERAL VASCULAR CATHETERIZATION N/A 05/25/2015   Procedure: IVC Filter Insertion;  Surgeon: Katha Cabal, MD;  Location: [place] [facility];  Service: Cardiovascular;  Laterality: N/A;  .  TOE AMPUTATION     right    Prior to Admission medications   Medication Sig Start Date End Date Taking? Authorizing Provider  aspirin EC 81 MG tablet Take 1 tablet (81 mg total) by mouth daily. 09/16/15   [name], MD  docusate sodium (COLACE) 100 MG capsule Take 100 mg by mouth 2 (two) times daily as needed for mild constipation.     Historical Provider, MD  folic acid (FOLVITE) A999333 MCG tablet Take 400 mcg by mouth daily.    Historical Provider, MD  hydroxyurea (HYDREA) 500 MG capsule Pt takes 4 capsules on Wed, Saturday, and Sunday,take 3 capsules on Monday, Tues Thurs, and Friday. Patient not taking: Reported on 08/05/2016 04/11/16   Lequita Asal, MD  hydroxyurea (HYDREA) 500 MG capsule Take 4 capsules 5 days a week and 3 capsules 2 days a week Patient not taking: Reported on 08/05/2016 08/04/16   Lequita Asal, MD  ipratropium-albuterol (DUONEB) 0.5-2.5 (3) MG/3ML SOLN Take 3 mLs by nebulization every 4 (four) hours. 09/24/15   Theodoro Grist, MD  menthol-cetylpyridinium (CEPACOL) 3 MG lozenge Take 1 lozenge (3 mg total) by mouth as needed for sore throat. 09/25/15   Theodoro Grist, MD  Polyethylene Glycol POWD Take 17 g by mouth daily. 11/22/15   Clayburn Pert, MD  potassium chloride SA (K-DUR,KLOR-CON) 20 MEQ tablet Take 1 tablet (20 mEq total) by mouth 2 (two) times daily. 07/20/15   Vaughan Basta, MD  warfarin (COUMADIN) 1 MG tablet Take 1 tab (1 MG) with a 5 mg tab every Friday for a total dose of 6 mg every Friday. Take a 5 mg tablet Sat - Thursday Patient not taking: Reported on 08/05/2016 06/18/16   Lequita Asal, MD  warfarin (COUMADIN) 5 MG tablet Take 1 tablet (5 mg total) by mouth daily. Take 5 mg Sat - Thursday and take 6 mg every Friday Patient not taking: Reported on 08/05/2016 06/18/16   Lequita Asal, MD    Allergies Review of patient's allergies indicates no known allergies.  Family History  Problem Relation Age of Onset  . Heart attack Father    . COPD Father   . Hypertension    . Arthritis-Osteo    . Diabetes Brother   . Osteosarcoma Son     Social History Social History  Substance Use Topics  . Smoking status: Never Smoker  . Smokeless tobacco: Never Used  . Alcohol use No    Review of Systems Constitutional: No fever/chills Eyes: right vision loss ENT: No sore throat. Cardiovascular: Denies chest pain. Respiratory: Denies shortness of breath. Gastrointestinal: No abdominal pain.  No nausea, no vomiting.  No diarrhea.  No constipation. Genitourinary: Negative for dysuria. Musculoskeletal: Negative for back pain. Skin: Negative for rash. Neurological: Negative for headaches, focal weakness or numbness.  10-point ROS otherwise negative.  ____________________________________________   PHYSICAL EXAM:  VITAL SIGNS: ED Triage Vitals  Enc Vitals Group     BP 08/25/16 2337 (!) 169/103     Pulse Rate 08/25/16 2337 81     Resp 08/25/16 2337 16     Temp 08/25/16 2337 98.1  F (36.7 C)     Temp Source 08/25/16 2337 Oral     SpO2 08/25/16 2337 98 %     Weight 08/25/16 2333 209 lb (94.8 kg)     Height 08/25/16 2333 5\' 10"  (1.778 m)     Head Circumference --      Peak Flow --      Pain Score 08/25/16 2333 0     Pain Loc --      Pain Edu? --      Excl. in [place]? --     Constitutional: Alert and oriented. Well appearing and in no acute distress. Eyes: Conjunctivae are normal. PERRL. EOMI. Patient reports she is unable to see anything on the right thigh. Head: Atraumatic. Nose: No congestion/rhinnorhea. Mouth/Throat: Mucous membranes are moist.  Oropharynx non-erythematous. Cardiovascular: Normal rate, regular rhythm. Grossly normal heart sounds.  Good peripheral circulation. Respiratory: Normal respiratory effort.  No retractions. Lungs CTAB. Gastrointestinal: Soft and nontender. No distention. Positive bowel sounds Musculoskeletal: No lower extremity tenderness nor edema.   Neurologic:  Normal speech and  language. Cranial nerves II through XII grossly intact aside from vision loss on the right eye patient has no other focal motor or neuro deficits. Skin:  Skin is warm, dry and intact.  Psychiatric: Mood and affect are normal.   ____________________________________________   LABS (all labs ordered are listed, but only abnormal results are displayed)  Labs Reviewed  CBC - Abnormal; Notable for the following:       Result Value   RDW 26.9 (*)    Platelets 842 (*)    All other components within normal limits  BASIC METABOLIC PANEL - Abnormal; Notable for the following:    Glucose, Bld 106 (*)    Calcium 8.7 (*)    All other components within normal limits  PROTIME-INR - Abnormal; Notable for the following:    Prothrombin Time 28.3 (*)    All other components within normal limits  APTT - Abnormal; Notable for the following:    aPTT 45 (*)    All other components within normal limits  TROPONIN I  SEDIMENTATION RATE  C-REACTIVE PROTEIN   ____________________________________________  EKG  ED ECG REPORT I, Loney Hering, the attending physician, personally viewed and interpreted this ECG.   Date: 08/25/2016  EKG Time: 2347  Rate: 82  Rhythm: normal sinus rhythm  Axis: normal  Intervals:none and right bundle branch block  ST&T Change: none  ____________________________________________  RADIOLOGY  CT head ____________________________________________   PROCEDURES  Procedure(s) performed: None  Procedures  Critical Care performed: No  ____________________________________________   INITIAL IMPRESSION / ASSESSMENT AND PLAN / ED COURSE  Pertinent labs & imaging results that were available during my care of the patient were reviewed by me and considered in my medical decision making (see chart for details).  This is an 82 year old female who comes into the hospital today with right sided vision loss. She has no other complaints. My concerns at this point our  Central retinal venous occlusion versus central retinal arterial occlusion, retinal detachment, ophthalmic stroke. I did contact ophthalmology who felt that stroke needed to be further evaluated but if it were any other situation he could see the patient in the office. He is concerned for a vascular or ischemic cause to the patient's vision loss. I will check some blood work as well as a CT scan of the patient's head and I will have the patient evaluated by specialist on-call neurology.  Clinical Course  Value Comment By Time  CT Head Wo Contrast 1. No acute intracranial pathology seen on CT. 2. Mild cortical volume loss, with significant cerebellar atrophy.   Loney Hering, MD 10/17 0210   The patient was seen by specialist on-call neurology. She reports that the patient is saying she is seeing some blurriness on her right eye now compared to complete vision loss. The patient did have visual acuity done and stated she could not see anything on the right side. I contacted Dr. George Ina as well who felt that the patient needed to be worked up for an ophthalmologic stroke and otherwise could be seen in the office. The specialist on-call felt that the patient needed to be admitted to the hospital for further stroke evaluation. The patient did have some mild vision with the physician on call. The patient will be admitted to the hospitalist service. She has no further complaints at this time.  ____________________________________________   FINAL CLINICAL IMPRESSION(S) / ED DIAGNOSES  Final diagnoses:  Vision loss of right eye      NEW MEDICATIONS STARTED DURING THIS VISIT:  New Prescriptions   No medications on file     Note:  This document was prepared using Dragon voice recognition software and may include unintentional dictation errors.    Loney Hering, MD 08/26/16 [phone]

## 2016-08-26 NOTE — Evaluation (Signed)
Physical Therapy Evaluation Patient Details Name: Yolanda Wells MRN: [id number] DOB: 21-Aug-1932 Today's Date: 08/26/2016   History of Present Illness  Pt is an 82 y/o female here with quick onset R eye vision loss.  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  history of Deep venous thrombosis, polycythemia, collagenous vascular disease, CVA, hypertension.  Clinical Impression  Pt showed great effort with all tasks but is very weak and unsteady with standing/ambulation.  She appeared to have more coordination/weakness issues than baseline - though apparently recently she has had a harder and harder time with the PT that she has been doing at the facility and overall pt has needed more assist.  Pt very limited with getting bed to recliner even with assist and cuing and overall appears to be more appropriate for short term rehab than return to ALF.      Follow Up Recommendations SNF    Equipment Recommendations  Rolling walker with 5" wheels (pt currently using 4WW and has some unsteadiness issues)    Recommendations for Other Services       Precautions / Restrictions Precautions Precautions: Fall Restrictions Weight Bearing Restrictions: No      Mobility  Bed Mobility Overal bed mobility: Needs Assistance Bed Mobility: Supine to Sit     Supine to sit: Min assist;Min guard     General bed mobility comments: Pt needing extra time and heavy effort with rails but able to swing legs to EOB and slowly get herself to sitting.  PT needing only to give minimal assist and cuing.   Transfers Overall transfer level: Needs assistance Equipment used: Rolling walker (2 wheeled) Transfers: Sit to/from Stand Sit to Stand: Min assist         General transfer comment: Pt showed good effort on both standing attempts but not only needed a lot of cuing and set up but also did need some phyiscal assist to get to  upright  Ambulation/Gait Ambulation/Gait assistance: Mod assist Ambulation Distance (Feet): 3 Feet Assistive device: Rolling walker (2 wheeled)       General Gait Details: Pt with unsteady, inconsistent ability to move feet and generally use walker appropriately.  Pt did not have any overt LOBs, but she lacked confidence and was not at all steady.  She struggled to manipulate the walker and overall showed poor safety.    Stairs            Wheelchair Mobility    Modified Rankin (Stroke Patients Only)       Balance Overall balance assessment: Needs assistance Sitting-balance support: Single extremity supported Sitting balance-Leahy Scale: Fair     Standing balance support: Bilateral upper extremity supported Standing balance-Leahy Scale: Poor Standing balance comment: Pt unsteady and overall showed poor balance with both standing/walking attempts                             Pertinent Vitals/Pain Pain Assessment: No/denies pain    Home Living Family/patient expects to be discharged to:: Assisted living               Home Equipment: Walker - 4 wheels;Wheelchair - power      Prior Function Level of Independence: Needs assistance         Comments: Pt has recently been having a harder and harder time with walking      Hand Dominance  Extremity/Trunk Assessment   Upper Extremity Assessment: Generalized weakness (Pt with very limited shoulder AROM, chronic issues)           Lower Extremity Assessment: Generalized weakness (L LE grossly 3-/5 (no SLR, foot drop), R grossly 3+/5)         Communication   Communication: No difficulties  Cognition Arousal/Alertness: Awake/alert Behavior During Therapy: WFL for tasks assessed/performed Overall Cognitive Status: History of cognitive impairments - at baseline                      General Comments      Exercises     Assessment/Plan    PT Assessment Patient needs  continued PT services  PT Problem List Decreased strength;Decreased range of motion;Decreased activity tolerance;Decreased balance;Decreased mobility;Decreased coordination;Decreased cognition;Decreased knowledge of use of DME;Decreased safety awareness          PT Treatment Interventions Gait training;Therapeutic activities;Therapeutic exercise;Functional mobility training;DME instruction;Balance training;Patient/family education;Cognitive remediation    PT Goals (Current goals can be found in the Care Plan section)  Acute Rehab PT Goals Patient Stated Goal: walk farther PT Goal Formulation: With patient/family Time For Goal Achievement: 09/09/16 Potential to Achieve Goals: Fair    Frequency Min 2X/week   Barriers to discharge        Co-evaluation               End of Session Equipment Utilized During Treatment: Gait belt Activity Tolerance: Patient tolerated treatment well Patient left: with chair alarm set;with call bell/phone within reach;with family/visitor present      Functional Assessment Tool Used: clinical judgement Functional Limitation: Mobility: Walking and moving around Mobility: Walking and Moving Around Current Status [phone]): At least 60 percent but less than 80 percent impaired, limited or restricted Mobility: Walking and Moving Around Goal Status [phone]): At least 20 percent but less than 40 percent impaired, limited or restricted    Time: [id number] PT Time Calculation (min) (ACUTE ONLY): 19 min   Charges:   PT Evaluation $PT Eval Low Complexity: 1 Procedure     PT G Codes:   PT G-Codes **NOT FOR INPATIENT CLASS** Functional Assessment Tool Used: clinical judgement Functional Limitation: Mobility: Walking and moving around Mobility: Walking and Moving Around Current Status [id number]): At least 60 percent but less than 80 percent impaired, limited or restricted Mobility: Walking and Moving Around Goal Status [phone]): At least 20 percent but less  than 40 percent impaired, limited or restricted    Kreg Shropshire, DPT 08/26/2016, 1:21 PM

## 2016-08-26 NOTE — Evaluation (Signed)
Occupational Therapy Evaluation Patient Details Name: Yolanda Wells MRN: [id number] DOB: January 19, 1932 Today's Date: 08/26/2016    History of Present Illness Pt is an 82 y/o female here with quick onset R eye vision loss.  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  history of Deep venous thrombosis, polycythemia, collagenous vascular disease, CVA, hypertension.  Pt is at [facility].   Clinical Impression   Pt is 82 year old female who presents with sudden vision loss of R eye (see vision evaluation) with decreased balance during ADLs.  She has arthritic changes in BUEs and hands which was present before admission but AROM is functional for ADLs.  She has a colostomy bag in place which limits forward flexion for LB dressing and bathing skills.  She has assist each morning for bathing and dressing at [place] [facility], but would benefit from continued ed and training in visual training skills to compensate for vision loss in R eye to prevent falls and injury.  Pt would benefit from skilled OT services to address ADL training, adaptive equipment training, strengthening, and family ed and training.  Pt would benefit from SNF for continued rehab after discharge from hospital.    Follow Up Recommendations  SNF    Equipment Recommendations       Recommendations for Other Services       Precautions / Restrictions Precautions Precautions: Fall Restrictions Weight Bearing Restrictions: No      Mobility Bed Mobility Overal bed mobility: Needs Assistance Bed Mobility: Supine to Sit     Supine to sit: Min assist;Min guard     General bed mobility comments: Pt needing extra time and heavy effort with rails but able to swing legs to EOB and slowly get herself to sitting.  PT needing only to give minimal assist and cuing.   Transfers Overall transfer level: Needs assistance Equipment used: Rolling walker  (2 wheeled) Transfers: Sit to/from Stand Sit to Stand: Min assist         General transfer comment: Pt showed good effort on both standing attempts but not only needed a lot of cuing and set up but also did need some phyiscal assist to get to upright    Balance Overall balance assessment: Needs assistance Sitting-balance support: Single extremity supported Sitting balance-Leahy Scale: Fair     Standing balance support: Bilateral upper extremity supported Standing balance-Leahy Scale: Poor Standing balance comment: Pt unsteady and overall showed poor balance with both standing/walking attempts                            ADL Overall ADL's : Needs assistance/impaired Eating/Feeding: Independent;Set up   Grooming: Wash/dry hands;Wash/dry face;Oral care;Applying deodorant;Brushing hair;Independent;Set up           Upper Body Dressing : Independent;Set up   Lower Body Dressing: Maximal assistance;Sit to/from stand Lower Body Dressing Details (indicate cue type and reason): Pt has a colostomy bag which limits trunk flexion for LB dressing and bathing.                     Vision Vision Assessment?: Yes Eye Alignment: Within Functional Limits Ocular Range of Motion: Restricted on the right;Restricted looking up Alignment/Gaze Preference: Head tilt Tracking/Visual Pursuits: Right eye does not track medially;Decreased smoothness of eye movement to RIGHT superior field;Decreased smoothness of horizontal tracking;Requires cues, head turns, or add eye shifts  to track Saccades: Additional eye shifts occurred during testing;Additional head turns occurred during testing Convergence: Impaired (comment) Visual Fields: Right visual field deficit   Perception Perception Perception Tested?: No   Praxis      Pertinent Vitals/Pain Pain Assessment: No/denies pain     Hand Dominance Right   Extremity/Trunk Assessment Upper Extremity Assessment Upper Extremity  Assessment:  (arthritis in all joints iwth limitations in BUEs to about 80 degrees flexion and ABD)   Lower Extremity Assessment Lower Extremity Assessment: Defer to PT evaluation       Communication Communication Communication: No difficulties   Cognition Arousal/Alertness: Awake/alert Behavior During Therapy: WFL for tasks assessed/performed Overall Cognitive Status: History of cognitive impairments - at baseline                     General Comments       Exercises       Shoulder Instructions      Home Living Family/patient expects to be discharged to:: Assisted living                             Home Equipment: Walker - 4 wheels;Wheelchair - power          Prior Functioning/Environment Level of Independence: Needs assistance    ADL's / Homemaking Assistance Needed: Pt has assist with bathing and dressing every day per patient report mainly for LB dressing and bathing.   Comments: Pt has recently been having a harder and harder time with walking         OT Problem List: Impaired vision/perception;Decreased safety awareness   OT Treatment/Interventions: Self-care/ADL training;Patient/family education;Visual/perceptual remediation/compensation;Therapeutic activities    OT Goals(Current goals can be found in the care plan section) Acute Rehab OT Goals Patient Stated Goal: "to get my vision back" OT Goal Formulation: With patient Time For Goal Achievement: 09/09/16 Potential to Achieve Goals: Good ADL Goals Pt Will Perform Lower Body Dressing: with min assist;sit to/from stand (with no LOB and using comp tech for vision) Pt Will Transfer to Toilet: with min assist;bedside commode;stand pivot transfer (BSC over toilet and no LOB) Pt/caregiver will Perform Home Exercise Program: Independently (vision training to compensate for decreased vision in R eye)  OT Frequency: Min 1X/week   Barriers to D/C:            Co-evaluation               End of Session    Activity Tolerance: Patient tolerated treatment well Patient left: in chair;with call bell/phone within reach;with chair alarm set   Time: 1330-1415 OT Time Calculation (min): 45 min Charges:  OT General Charges $OT Visit: 1 Procedure G-Codes: OT G-codes **NOT FOR INPATIENT CLASS** Functional Limitation: Self care Self Care Current Status [id number]): At least 40 percent but less than 60 percent impaired, limited or restricted Self Care Goal Status [id number]): At least 20 percent but less than 40 percent impaired, limited or restricted    Chrys Racer, OTR/L ascom [phone] 08/26/16, 3:19 PM

## 2016-08-26 NOTE — Progress Notes (Signed)
*  PRELIMINARY RESULTS* Echocardiogram 2D Echocardiogram has been performed.  Yolanda Wells 08/26/2016, 2:52 PM

## 2016-08-26 NOTE — Consult Note (Addendum)
Referring Physician: Mody    Chief Complaint: Right eye vision loss  HPI: Yolanda Wells is an 82 y.o. female who reports going to bed normal on the 16th.  Awakened in the early morning hours on the 17th and was unable to see out of her right eye.  With no improvement in her symptoms patient presented for evaluation.  Patient with a history of cataracts but no history of visual loss.  Initial NIHSS of 2.    Date last known well: Date: 08/25/2016 Time last known well: Time: 20:30 tPA Given: No: On Coumadin with therapeutic INR  Past Medical History:  Diagnosis Date  . Arthritis   . Collagen vascular disease ([place])   . Deep venous thrombosis ([facility])    right lower extremity  . Dependent edema   . History of bilateral hip replacements   . History of hysterectomy   . Hypertension   . Polycythemia vera ([place])   . Stroke [facility])    Pt reports she was told she had a stroke in may    Past Surgical History:  Procedure Laterality Date  . ABDOMINAL HYSTERECTOMY    . CARDIAC CATHETERIZATION Right 04/03/2015   Procedure: CENTRAL LINE INSERTION;  Surgeon: Sherri Rad, MD;  Location: [facility] ORS;  Service: General;  Laterality: Right;  . COLECTOMY WITH COLOSTOMY CREATION/HARTMANN PROCEDURE N/A 04/03/2015   Procedure: COLECTOMY WITH COLOSTOMY CREATION/HARTMANN PROCEDURE;  Surgeon: Sherri Rad, MD;  Location: [facility] ORS;  Service: General;  Laterality: N/A;  . JOINT REPLACEMENT     Left and Right Hip  . PERIPHERAL VASCULAR CATHETERIZATION N/A 04/02/2015   Procedure: Visceral Angiography;  Surgeon: Algernon Huxley, MD;  Location: [place] CV LAB;  Service: Cardiovascular;  Laterality: N/A;  . PERIPHERAL VASCULAR CATHETERIZATION N/A 04/02/2015   Procedure: Visceral Artery Intervention;  Surgeon: Algernon Huxley, MD;  Location: [place] [facility];  Service: Cardiovascular;  Laterality: N/A;  . PERIPHERAL VASCULAR CATHETERIZATION N/A 05/25/2015   Procedure: IVC Filter Insertion;  Surgeon: Katha Cabal, MD;   Location: [name] [facility];  Service: Cardiovascular;  Laterality: N/A;  . TOE AMPUTATION     right    Family History  Problem Relation Age of Onset  . Heart attack Father   . COPD Father   . Hypertension    . Arthritis-Osteo    . Diabetes Brother   . Osteosarcoma Son    Social History:  reports that she has never smoked. She has never used smokeless tobacco. She reports that she does not drink alcohol or use drugs.  Allergies: No Known Allergies  Medications:  I have reviewed the patient's current medications. Prior to Admission:  Prescriptions Prior to Admission  Medication Sig Dispense Refill Last Dose  . aspirin EC 81 MG tablet Take 1 tablet (81 mg total) by mouth daily. 30 tablet 2 08/25/2016 at Unknown time  . docusate sodium (COLACE) 100 MG capsule Take 100 mg by mouth 2 (two) times daily as needed for mild constipation.    08/25/2016 at Unknown time  . Polyethylene Glycol POWD Take 17 g by mouth daily. 527 g 5 Past Week at Unknown time  . potassium chloride SA (K-DUR,KLOR-CON) 20 MEQ tablet Take 1 tablet (20 mEq total) by mouth 2 (two) times daily. 30 tablet 0 08/25/2016 at Unknown time  . folic acid (FOLVITE) [id number] MCG tablet Take 400 mcg by mouth daily.   Not Taking at Unknown time  . hydroxyurea (HYDREA) 500 MG capsule Pt takes 4 capsules on  Wed, Saturday, and Sunday,take 3 capsules on Monday, Tues Thurs, and Friday. (Patient not taking: Reported on 08/05/2016) 96 capsule 3 Not Taking  . hydroxyurea (HYDREA) 500 MG capsule Take 4 capsules 5 days a week and 3 capsules 2 days a week (Patient not taking: Reported on 08/05/2016) 100 capsule 0 Not Taking  . ipratropium-albuterol (DUONEB) 0.5-2.5 (3) MG/3ML SOLN Take 3 mLs by nebulization every 4 (four) hours. (Patient not taking: Reported on 08/26/2016) 360 mL 6 Not Taking at Unknown time  . menthol-cetylpyridinium (CEPACOL) 3 MG lozenge Take 1 lozenge (3 mg total) by mouth as needed for sore throat. (Patient not taking: Reported  on 08/26/2016) 100 tablet 12 Not Taking at Unknown time  . warfarin (COUMADIN) 1 MG tablet Take 1 tab (1 MG) with a 5 mg tab every Friday for a total dose of 6 mg every Friday. Take a 5 mg tablet Sat - Thursday (Patient not taking: Reported on 08/26/2016) 15 tablet 1 Not Taking at Unknown time  . warfarin (COUMADIN) 5 MG tablet Take 1 tablet (5 mg total) by mouth daily. Take 5 mg Sat - Thursday and take 6 mg every Friday (Patient not taking: Reported on 08/26/2016) 30 tablet 2 Not Taking at Unknown time   Scheduled: . aspirin EC  81 mg Oral Daily  . folic acid  0.5 mg Oral Daily  . hydroxyurea  1,500 mg Oral Once per day on Mon Tue Thu Fri  . [START ON 08/27/2016] hydroxyurea  2,000 mg Oral Once per day on Sun Wed Sat  . [START ON 08/27/2016] levofloxacin (LEVAQUIN) IV  750 mg Intravenous Q24H  . warfarin  6 mg Oral q1800    ROS: History obtained from the patient  General ROS: negative for - chills, fatigue, fever, night sweats, weight gain or weight loss Psychological ROS: negative for - behavioral disorder, hallucinations, memory difficulties, mood swings or suicidal ideation Ophthalmic ROS: negative for - blurry vision, double vision, eye pain or loss of vision ENT ROS: negative for - epistaxis, nasal discharge, oral lesions, sore throat, tinnitus or vertigo Allergy and Immunology ROS: negative for - hives or itchy/watery eyes Hematological and Lymphatic ROS: negative for - bleeding problems, bruising or swollen lymph nodes Endocrine ROS: negative for - galactorrhea, hair pattern changes, polydipsia/polyuria or temperature intolerance Respiratory ROS: negative for - cough, hemoptysis, shortness of breath or wheezing Cardiovascular ROS: negative for - chest pain, dyspnea on exertion, edema or irregular heartbeat Gastrointestinal ROS: negative for - abdominal pain, diarrhea, hematemesis, nausea/vomiting or stool incontinence Genito-Urinary ROS: negative for - dysuria, hematuria,  incontinence or urinary frequency/urgency Musculoskeletal ROS: negative for - joint swelling or muscular weakness Neurological ROS: as noted in HPI Dermatological ROS: negative for rash and skin lesion changes  Physical Examination: Blood pressure (!) 157/76, pulse 75, temperature 97.9 F (36.6 C), temperature source Oral, resp. rate 20, height 5\' 7"  (1.702 m), weight 91.9 kg (202 lb 8 oz), SpO2 100 %.  HEENT-  Normocephalic, no lesions, without obvious abnormality.  Normal external eye and conjunctiva.  Normal TM's bilaterally.  Normal auditory canals and external ears. Normal external nose, mucus membranes and septum.  Normal pharynx. Cardiovascular- S1, S2 normal, pulses palpable throughout   Lungs- chest clear, no wheezing, rales, normal symmetric air entry Abdomen- soft, non-tender; bowel sounds normal; no masses,  no organomegaly Extremities- right toe amputations Lymph-no adenopathy palpable Musculoskeletal-no joint tenderness, deformity or swelling Skin-warm and dry, no hyperpigmentation, vitiligo, or suspicious lesions  Neurological Examination Mental Status: Alert, oriented,  thought content appropriate.  Speech fluent without evidence of aphasia.  Able to follow 3 step commands without difficulty. Cranial Nerves: II: Discs flat bilaterally; Decreased vision from the right eye, temporal vision worse than nasal vision.  Vision intact in the left eye. Right pupil irregular and unreactive.  Left pupil reactive.   III,IV, VI: ptosis not present, extra-ocular motions intact bilaterally V,VII: smile symmetric, facial light touch sensation normal bilaterally VIII: hearing normal bilaterally IX,X: gag reflex present XI: bilateral shoulder shrug XII: midline tongue extension Motor: Right : Upper extremity   5/5    Left:     Upper extremity   5/5  Lower extremity   5-/5     Lower extremity   4-/5 Tone and bulk:normal tone throughout; no atrophy noted Sensory: Pinprick and light touch  intact throughout, bilaterally Deep Tendon Reflexes: 2+ in the upper extremities and absent in the lower extremities.   Plantars: Right: toe amputation   Left: mute Cerebellar: Dysmetria with finger-to-nose and heel-to-shin testing on the right. Unable to perform with the LLE due to weakness. Gait: not tested due to safety concerns      Laboratory Studies:  Basic Metabolic Panel:  Recent Labs Lab 08/25/16 2351  NA 136  K 4.3  CL 106  CO2 24  GLUCOSE 106*  BUN 16  CREATININE 0.69  CALCIUM 8.7*    Liver Function Tests: No results for input(s): AST, ALT, ALKPHOS, BILITOT, PROT, ALBUMIN in the last 168 hours. No results for input(s): LIPASE, AMYLASE in the last 168 hours. No results for input(s): AMMONIA in the last 168 hours.  CBC:  Recent Labs Lab 08/25/16 2351  WBC 4.9  HGB 13.5  HCT 40.0  MCV 91.0  PLT 842*    Cardiac Enzymes:  Recent Labs Lab 08/25/16 2351  TROPONINI <0.03    BNP: Invalid input(s): POCBNP  CBG: No results for input(s): GLUCAP in the last 168 hours.  Microbiology: Results for orders placed or performed during the hospital encounter of 08/25/16  MRSA PCR Screening     Status: None   Collection Time: 08/26/16  5:38 AM  Result Value Ref Range Status   MRSA by PCR NEGATIVE NEGATIVE Final    Comment:        The GeneXpert MRSA Assay (FDA approved for NASAL specimens only), is one component of a comprehensive MRSA colonization surveillance program. It is not intended to diagnose MRSA infection nor to guide or monitor treatment for MRSA infections.     Coagulation Studies:  Recent Labs  08/25/16 2351  LABPROT 28.3*  INR 2.59    Urinalysis: No results for input(s): COLORURINE, LABSPEC, PHURINE, GLUCOSEU, HGBUR, BILIRUBINUR, KETONESUR, PROTEINUR, UROBILINOGEN, NITRITE, LEUKOCYTESUR in the last 168 hours.  Invalid input(s): APPERANCEUR  Lipid Panel:    Component Value Date/Time   CHOL 82 08/26/2016 0510   CHOL 64  02/20/2012 0435   TRIG 53 08/26/2016 0510   TRIG 61 02/20/2012 0435   HDL 28 (L) 08/26/2016 0510   HDL 10 (L) 02/20/2012 0435   CHOLHDL 2.9 08/26/2016 0510   VLDL 11 08/26/2016 0510   VLDL 12 02/20/2012 0435   LDLCALC 43 08/26/2016 0510   LDLCALC 42 02/20/2012 0435    HgbA1C:  Lab Results  Component Value Date   HGBA1C 5.1 09/15/2015    Urine Drug Screen:  No results found for: LABOPIA, COCAINSCRNUR, LABBENZ, AMPHETMU, THCU, LABBARB  Alcohol Level: No results for input(s): ETH in the last 168 hours.  Other results: EKG: sinus  rhythm at 82 bpm.  Imaging: Ct Head Wo Contrast  Result Date: 08/26/2016 CLINICAL DATA:  Acute onset of right eye pain, and transient loss of vision. Initial encounter. EXAM: CT HEAD WITHOUT CONTRAST TECHNIQUE: Contiguous axial images were obtained from the base of the skull through the vertex without intravenous contrast. COMPARISON:  CT of the head and MRI of the brain performed 05/25/2015 FINDINGS: Brain: No evidence of acute infarction, hemorrhage, hydrocephalus, extra-axial collection or mass lesion/mass effect. Prominence of the sulci suggests mild cortical volume loss. Cerebellar atrophy is noted. The brainstem and fourth ventricle are within normal limits. The basal ganglia are unremarkable in appearance. The cerebral hemispheres demonstrate grossly normal gray-white differentiation. No mass effect or midline shift is seen. Vascular: No hyperdense vessel or unexpected calcification. Skull: There is no evidence of fracture; visualized osseous structures are unremarkable in appearance. Sinuses/Orbits: The visualized portions of the orbits are within normal limits. The paranasal sinuses and mastoid air cells are well-aerated. Other: No significant soft tissue abnormalities are seen. IMPRESSION: 1. No acute intracranial pathology seen on CT. 2. Mild cortical volume loss, with significant cerebellar atrophy. Electronically Signed   By: Garald Balding M.D.   On:  08/26/2016 01:18   Mr Brain Wo Contrast  Result Date: 08/26/2016 CLINICAL DATA:  Right-sided vision loss. EXAM: MRI HEAD WITHOUT CONTRAST MRA HEAD WITHOUT CONTRAST TECHNIQUE: Multiplanar, multiecho pulse sequences of the brain and surrounding structures were obtained without intravenous contrast. Angiographic images of the head were obtained using MRA technique without contrast. COMPARISON:  05/25/2015 FINDINGS: MRI HEAD FINDINGS Brain: No acute infarction, hemorrhage, hydrocephalus, extra-axial collection (prominent extra-axial CSF spaces have traversing vessels) or mass lesion. Atrophy, greatest in the cerebellum and throughout the brainstem. No noted small-vessel ischemic change in the cerebral white matter. No chronic blood products. Vascular: Arterial findings described below. Negative dural venous sinuses. Skull and upper cervical spine: Sclerotic signal in the right frontal bone, measuring up to 2 cm in diameter, stable from 2016 and presumably benign. There is no reported history of malignancy. Sinuses/Orbits: Bilateral cataract resection. No evidence of orbital mass or intra-ocular collection. Other: Intermittently motion degraded exam. MRA HEAD FINDINGS Standard vertebrobasilar and carotid branching. Small if any posterior communicating arteries. Intermittently narrowed appearance of proximal right M2 branches is likely combination of atherosclerosis and artifact at the end of imaging slab. No major branch occlusion or treatable flow limiting stenosis. Negative for aneurysm. IMPRESSION: 1. No acute finding including infarct. 2. Intracranial atherosclerosis without proximal occlusion or flow limiting stenosis. 3. Cerebellar and brainstem predominant atrophy. Electronically Signed   By: Monte Fantasia M.D.   On: 08/26/2016 10:44   US Carotid Bilateral (at [facility] And [facility] Only)  Result Date: 08/26/2016 CLINICAL DATA:  TIA.  Hypertension, visual disturbance. EXAM: BILATERAL CAROTID DUPLEX ULTRASOUND  TECHNIQUE: Pearline Cables scale imaging, color Doppler and duplex ultrasound was performed of bilateral carotid and vertebral arteries in the neck. COMPARISON:  05/25/2015 TECHNIQUE: Quantification of carotid stenosis is based on velocity parameters that correlate the residual internal carotid diameter with NASCET-based stenosis levels, using the diameter of the distal internal carotid lumen as the denominator for stenosis measurement. The following velocity measurements were obtained: PEAK SYSTOLIC/END DIASTOLIC RIGHT ICA:                     75/22cm/sec CCA:                     [id number] SYSTOLIC ICA/CCA RATIO:  1.1 DIASTOLIC ICA/CCA RATIO:  1.6 ECA:                     37cm/sec LEFT ICA:                     63/18cm/sec CCA:                     [id number] SYSTOLIC ICA/CCA RATIO:  1.0 DIASTOLIC ICA/CCA RATIO: 1.7 ECA:                     42cm/sec FINDINGS: RIGHT CAROTID ARTERY: Eccentric partially calcified plaque in the carotid bulb and proximal internal and external carotid arteries. No high-grade stenosis. Normal waveforms and color Doppler signal. RIGHT VERTEBRAL ARTERY:  Normal flow direction and waveform. LEFT CAROTID ARTERY: Minimal plaque in the carotid bulb. No significant stenosis. Normal waveforms and color Doppler signal. LEFT VERTEBRAL ARTERY: Normal flow direction and waveform. IMPRESSION: 1. Mild bilateral carotid bifurcation plaque, right greater than left, resulting in less than 50% diameter stenosis. The exam does not exclude plaque ulceration or embolization. Continued surveillance recommended. 2.  Antegrade bilateral vertebral arterial flow. Electronically Signed   By: Lucrezia Europe M.D.   On: 08/26/2016 09:55   Mr Jodene Nam Head/brain [id number] Cm  Result Date: 08/26/2016 CLINICAL DATA:  Right-sided vision loss. EXAM: MRI HEAD WITHOUT CONTRAST MRA HEAD WITHOUT CONTRAST TECHNIQUE: Multiplanar, multiecho pulse sequences of the brain and surrounding structures were obtained without intravenous contrast. Angiographic  images of the head were obtained using MRA technique without contrast. COMPARISON:  05/25/2015 FINDINGS: MRI HEAD FINDINGS Brain: No acute infarction, hemorrhage, hydrocephalus, extra-axial collection (prominent extra-axial CSF spaces have traversing vessels) or mass lesion. Atrophy, greatest in the cerebellum and throughout the brainstem. No noted small-vessel ischemic change in the cerebral white matter. No chronic blood products. Vascular: Arterial findings described below. Negative dural venous sinuses. Skull and upper cervical spine: Sclerotic signal in the right frontal bone, measuring up to 2 cm in diameter, stable from 2016 and presumably benign. There is no reported history of malignancy. Sinuses/Orbits: Bilateral cataract resection. No evidence of orbital mass or intra-ocular collection. Other: Intermittently motion degraded exam. MRA HEAD FINDINGS Standard vertebrobasilar and carotid branching. Small if any posterior communicating arteries. Intermittently narrowed appearance of proximal right M2 branches is likely combination of atherosclerosis and artifact at the end of imaging slab. No major branch occlusion or treatable flow limiting stenosis. Negative for aneurysm. IMPRESSION: 1. No acute finding including infarct. 2. Intracranial atherosclerosis without proximal occlusion or flow limiting stenosis. 3. Cerebellar and brainstem predominant atrophy. Electronically Signed   By: Monte Fantasia M.D.   On: 08/26/2016 10:44    Assessment: 82 y.o. female presenting with right eye vision loss.  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  Patient on ASA and Coumadin at home and compliant.  Platelet count elevated though which may be placing patient at risk.  MRI of the brain personally reviewed and shows no acute changes.  Carotid dopplers show no evidence of hemodynamically significant stenosis.  Echocardiogram pending.  A1c pending, LDL 43.  Stroke Risk Factors - hypertension  Plan: 1.  PT consult, OT consult, Speech consult 2. Prophylactic therapy-Continue ASA and Coumadin 3. Telemetry monitoring 4. Frequent neuro checks 5. Patient to continue follow up with hematology 6. Follow up with ophthalmology    Alexis Goodell, MD Neurology [phone] 08/26/2016, 11:46 AM

## 2016-08-26 NOTE — Care Management (Signed)
Admitted to [facility] with the diagnosis of loss of vision right eye. Lives at [facility] x 6 years, Last seen Dr. Rob Hickman the end of September. Granddaughter is [name] [phone]). Receiving physical therapy through [facility] now. [facility] last September 2016. [place] in the past. No home oxygen. Cane, rolling walker, hospital bed, bedside commode, and wheelchair in the home. [place] a couple of weeks ago. Good appetite. Private aide 3-5 hours a day. Granddaughter is in the home on the weekends.  Physical therapy evaluation completed. Recommending skilled nursing facility. Ms. Umbarger is observation status. Will discuss with granddaughter agencies. Chose [facility]. Understands that [facility] will not be doing therapy anymore. Will fax information to [facility]. Granddaughter would like her grandmother to be transported per [facility].  Shelbie Ammons RN MSN CCM Care Management [phone]

## 2016-08-26 NOTE — Care Management Obs Status (Signed)
[place] NOTIFICATION   Patient Details  Name: Yolanda Wells MRN: [id number] Date of Birth: 1932-10-30   Medicare Observation Status Notification Given:  Yes Signed by POA granddaughter in room    Beau Fanny, RN 08/26/2016, 8:49 AM

## 2016-08-26 NOTE — Progress Notes (Signed)
[facility] responded to an OR for prayer. Pt was sitting in her seat. Granddaughter was present. Pt wanted prayer concerning her eye (has lost vision in Rt eye because of a stroke). [facility] provided the ministry of prayer. [facility] is available for follow up as needed.     08/26/16 1500  Clinical Encounter Type  Visited With Patient;Family  Visit Type Initial;Spiritual support  Referral From Nurse  Spiritual Encounters  Spiritual Needs Prayer

## 2016-08-26 NOTE — H&P (Signed)
[facility] [place] NAME: Yolanda Wells    MR#:  [id number]  DATE OF BIRTH:  1932/10/21  DATE OF ADMISSION:  08/25/2016  PRIMARY CARE PHYSICIAN: PROVIDER NOT IN SYSTEM   REQUESTING/REFERRING PHYSICIAN:   CHIEF COMPLAINT:   Chief Complaint  Patient presents with  . Eye Problem    HISTORY OF PRESENT ILLNESS: Yolanda Wells  is a 82 y.o. female with a known history of Deep venous thrombosis, polycythemia, collagenous vascular disease, CVA, hypertension presented to the emergency room with difficulty in vision in the right eye. Patient went to bed around 9 PM last night and woke up around 10 PM and felt like gritty sensation in the right eye. Patient could not see anything with the right eye and she says it was dark with no vision. She was able to see with the left eye with no problem. Patient had decreased vision in the right eye for the last few months but this complete visual loss happened yesterday night. She also has some redness in the right eye with some tearing. No history of any weakness in any part of the body. No history of any tingling or numbness sensation in any part of the body. No history of any slurred speech or difficulty swallowing food. Patient is compliant with her Coumadin and her INR is therapeutic. She was worked up with a CT head which did not show any acute intracranial abnormality. Telemetry neurology consultation was done in the emergency room who deferred thrombolytic therapy as her INR is 2.5. Advised patient to be admitted for stroke workup for evaluation of any vascular stenosis and any embolic etiology for stroke. Case was discussed by ER physician with ophthalmologist on call who recommended a stroke workup. Hospitalist service was consulted for further care of the patient.  PAST MEDICAL HISTORY:   Past Medical History:  Diagnosis Date  . Arthritis   . Collagen vascular disease ([place])   . Deep venous thrombosis  (HCC)    right lower extremity  . Dependent edema   . History of bilateral hip replacements   . History of hysterectomy   . Hypertension   . Polycythemia vera ([place])   . Stroke [facility])    Pt reports she was told she had a stroke in may    PAST SURGICAL HISTORY: Past Surgical History:  Procedure Laterality Date  . ABDOMINAL HYSTERECTOMY    . CARDIAC CATHETERIZATION Right 04/03/2015   Procedure: CENTRAL LINE INSERTION;  Surgeon: Sherri Rad, MD;  Location: [facility] ORS;  Service: General;  Laterality: Right;  . COLECTOMY WITH COLOSTOMY CREATION/HARTMANN PROCEDURE N/A 04/03/2015   Procedure: COLECTOMY WITH COLOSTOMY CREATION/HARTMANN PROCEDURE;  Surgeon: Sherri Rad, MD;  Location: [facility] ORS;  Service: General;  Laterality: N/A;  . JOINT REPLACEMENT     Left and Right Hip  . PERIPHERAL VASCULAR CATHETERIZATION N/A 04/02/2015   Procedure: Visceral Angiography;  Surgeon: Algernon Huxley, MD;  Location: [place] CV LAB;  Service: Cardiovascular;  Laterality: N/A;  . PERIPHERAL VASCULAR CATHETERIZATION N/A 04/02/2015   Procedure: Visceral Artery Intervention;  Surgeon: Algernon Huxley, MD;  Location: [place] [facility];  Service: Cardiovascular;  Laterality: N/A;  . PERIPHERAL VASCULAR CATHETERIZATION N/A 05/25/2015   Procedure: IVC Filter Insertion;  Surgeon: Katha Cabal, MD;  Location: [place] [facility];  Service: Cardiovascular;  Laterality: N/A;  . TOE AMPUTATION     right    SOCIAL HISTORY:  Social History  Substance Use  Topics  . Smoking status: Never Smoker  . Smokeless tobacco: Never Used  . Alcohol use No    FAMILY HISTORY:  Family History  Problem Relation Age of Onset  . Heart attack Father   . COPD Father   . Hypertension    . Arthritis-Osteo    . Diabetes Brother   . Osteosarcoma Son     DRUG ALLERGIES: No Known Allergies  REVIEW OF SYSTEMS:   CONSTITUTIONAL: No fever, fatigue or weakness.  EYES: No vision right eye Left eye normal vision. EARS, NOSE, AND THROAT:  No tinnitus or ear pain.  RESPIRATORY: No cough, shortness of breath, wheezing or hemoptysis.  CARDIOVASCULAR: No chest pain, orthopnea, edema.  GASTROINTESTINAL: No nausea, vomiting, diarrhea or abdominal pain.  GENITOURINARY: Has dysuria, No hematuria.  ENDOCRINE: No polyuria, nocturia,  HEMATOLOGY: No anemia, easy bruising or bleeding SKIN: No rash or lesion. MUSCULOSKELETAL: No joint pain or arthritis.   NEUROLOGIC: No tingling, numbness, weakness.  PSYCHIATRY: No anxiety or depression.   MEDICATIONS AT HOME:  Prior to Admission medications   Medication Sig Start Date End Date Taking? Authorizing Provider  aspirin EC 81 MG tablet Take 1 tablet (81 mg total) by mouth daily. 09/16/15   [name], MD  docusate sodium (COLACE) 100 MG capsule Take 100 mg by mouth 2 (two) times daily as needed for mild constipation.     Historical Provider, MD  folic acid (FOLVITE) A999333 MCG tablet Take 400 mcg by mouth daily.    Historical Provider, MD  hydroxyurea (HYDREA) 500 MG capsule Pt takes 4 capsules on Wed, Saturday, and Sunday,take 3 capsules on Monday, Tues Thurs, and Friday. Patient not taking: Reported on 08/05/2016 04/11/16   Lequita Asal, MD  hydroxyurea (HYDREA) 500 MG capsule Take 4 capsules 5 days a week and 3 capsules 2 days a week Patient not taking: Reported on 08/05/2016 08/04/16   Lequita Asal, MD  ipratropium-albuterol (DUONEB) 0.5-2.5 (3) MG/3ML SOLN Take 3 mLs by nebulization every 4 (four) hours. 09/24/15   Theodoro Grist, MD  menthol-cetylpyridinium (CEPACOL) 3 MG lozenge Take 1 lozenge (3 mg total) by mouth as needed for sore throat. 09/25/15   Theodoro Grist, MD  Polyethylene Glycol POWD Take 17 g by mouth daily. 11/22/15   Clayburn Pert, MD  potassium chloride SA (K-DUR,KLOR-CON) 20 MEQ tablet Take 1 tablet (20 mEq total) by mouth 2 (two) times daily. 07/20/15   Vaughan Basta, MD  warfarin (COUMADIN) 1 MG tablet Take 1 tab (1 MG) with a 5 mg tab every Friday  for a total dose of 6 mg every Friday. Take a 5 mg tablet Sat - Thursday Patient not taking: Reported on 08/05/2016 06/18/16   Lequita Asal, MD  warfarin (COUMADIN) 5 MG tablet Take 1 tablet (5 mg total) by mouth daily. Take 5 mg Sat - Thursday and take 6 mg every Friday Patient not taking: Reported on 08/05/2016 06/18/16   Lequita Asal, MD      PHYSICAL EXAMINATION:   VITAL SIGNS: Blood pressure 133/80, pulse 75, temperature 98.1 F (36.7 C), temperature source Oral, resp. rate 16, height 5\' 10"  (1.778 m), weight 94.8 kg (209 lb), SpO2 98 %.  GENERAL:  82 y.o.-year-old elderly female patient lying in the bed with no acute distress.  EYES: Left Pupil  reactive to light and accommodation at 67mm. No scleral icterus. Right eye : No vision, redness of conjunctiva noted, tearing noted. HEENT: Head atraumatic, normocephalic. Oropharynx and nasopharynx clear.  NECK:  Supple, no jugular venous distention. No thyroid enlargement, no tenderness.  LUNGS: Normal breath sounds bilaterally, no wheezing, rales,rhonchi or crepitation. No use of accessory muscles of respiration.  CARDIOVASCULAR: S1, S2 normal. No murmurs, rubs, or gallops.  ABDOMEN: Soft, nontender, nondistended. Bowel sounds present. No organomegaly or mass.  EXTREMITIES: No pedal edema, cyanosis, or clubbing.  Right foot amputated. NEUROLOGIC: Right eye vision loss. Muscle strength 5/5 in all extremities. Sensation intact. Gait not checked.  PSYCHIATRIC: The patient is alert and oriented x 3.  SKIN: No obvious rash, lesion, or ulcer.   LABORATORY PANEL:   CBC  Recent Labs Lab 08/25/16 2351  WBC 4.9  HGB 13.5  HCT 40.0  PLT 842*  MCV 91.0  MCH 30.8  MCHC 33.8  RDW 26.9*   ------------------------------------------------------------------------------------------------------------------  Chemistries   Recent Labs Lab 08/25/16 2351  NA 136  K 4.3  CL 106  CO2 24  GLUCOSE 106*  BUN 16  CREATININE 0.69   CALCIUM 8.7*   ------------------------------------------------------------------------------------------------------------------ estimated creatinine clearance is 67.6 mL/min (by C-G formula based on SCr of 0.69 mg/dL). ------------------------------------------------------------------------------------------------------------------ No results for input(s): TSH, T4TOTAL, T3FREE, THYROIDAB in the last 72 hours.  Invalid input(s): FREET3   Coagulation profile  Recent Labs Lab 08/19/16 0950 08/25/16 2351  INR 2.84 2.59   ------------------------------------------------------------------------------------------------------------------- No results for input(s): DDIMER in the last 72 hours. -------------------------------------------------------------------------------------------------------------------  Cardiac Enzymes  Recent Labs Lab 08/25/16 2351  TROPONINI <0.03   ------------------------------------------------------------------------------------------------------------------ Invalid input(s): POCBNP  ---------------------------------------------------------------------------------------------------------------  Urinalysis    Component Value Date/Time   COLORURINE YELLOW (A) 11/27/2015 1635   APPEARANCEUR CLOUDY (A) 11/27/2015 1635   APPEARANCEUR Hazy 07/08/2014 0956   LABSPEC 1.024 11/27/2015 1635   LABSPEC 1.019 07/08/2014 0956   PHURINE 5.0 11/27/2015 1635   GLUCOSEU NEGATIVE 11/27/2015 1635   GLUCOSEU Negative 07/08/2014 0956   HGBUR NEGATIVE 11/27/2015 1635   BILIRUBINUR NEGATIVE 11/27/2015 1635   BILIRUBINUR Negative 07/08/2014 0956   KETONESUR NEGATIVE 11/27/2015 1635   PROTEINUR 30 (A) 11/27/2015 1635   NITRITE POSITIVE (A) 11/27/2015 1635   LEUKOCYTESUR 3+ (A) 11/27/2015 1635   LEUKOCYTESUR 1+ 07/08/2014 0956     RADIOLOGY: Ct Head Wo Contrast  Result Date: 08/26/2016 CLINICAL DATA:  Acute onset of right eye pain, and transient loss of vision.  Initial encounter. EXAM: CT HEAD WITHOUT CONTRAST TECHNIQUE: Contiguous axial images were obtained from the base of the skull through the vertex without intravenous contrast. COMPARISON:  CT of the head and MRI of the brain performed 05/25/2015 FINDINGS: Brain: No evidence of acute infarction, hemorrhage, hydrocephalus, extra-axial collection or mass lesion/mass effect. Prominence of the sulci suggests mild cortical volume loss. Cerebellar atrophy is noted. The brainstem and fourth ventricle are within normal limits. The basal ganglia are unremarkable in appearance. The cerebral hemispheres demonstrate grossly normal gray-white differentiation. No mass effect or midline shift is seen. Vascular: No hyperdense vessel or unexpected calcification. Skull: There is no evidence of fracture; visualized osseous structures are unremarkable in appearance. Sinuses/Orbits: The visualized portions of the orbits are within normal limits. The paranasal sinuses and mastoid air cells are well-aerated. Other: No significant soft tissue abnormalities are seen. IMPRESSION: 1. No acute intracranial pathology seen on CT. 2. Mild cortical volume loss, with significant cerebellar atrophy. Electronically Signed   By: Garald Balding M.D.   On: 08/26/2016 01:18    EKG: Orders placed or performed during the hospital encounter of 08/25/16  . ED EKG  . ED EKG    IMPRESSION AND PLAN:  82 year old elderly female patient with history of polycythemia vera, DVT, CVA, hypertension presented to the emergency room with the loss of vision in the right eye. Admitting diagnosis 1. Mono ocular right eye vision loss assess for occipital CVA,embolic etiology. 2. Urinary tract infection 3. Polycythemia 4. Hypertension Treatment plan Admit patient to medical floor with telemetry monitoring Thrombolytic therapy deferred as INR is 2.5 Status post tele neurology consultation Echocardiogram to rule out any embolic etiology Carotid ultrasound to  check for any obstruction MRI and MRA brain to assess for CVA Aspirin 81 mg orally daily Resume Coumadin for anticoagulate patient Follow-up INR ophthalmology consultation Supportive care. All the records are reviewed and case discussed with ED provider. Management plans discussed with the patient, family and they are in agreement.  CODE STATUS:FULL Code Status History    Date Active Date Inactive Code Status Order ID Comments User Context   09/22/2015  5:55 PM 09/25/2015  6:44 PM Full Code [id number]  Demetrios Loll, MD ED   09/15/2015  3:41 AM 09/17/2015  5:41 PM Full Code [id number]  Harrie Foreman, MD Inpatient   07/16/2015  1:51 AM 07/20/2015 11:34 PM Full Code [id number]  Lance Coon, MD Inpatient   05/25/2015  8:34 AM 05/29/2015  4:16 PM Full Code [id number]  Harrie Foreman, MD Inpatient   04/02/2015  6:06 AM 04/08/2015  4:51 PM Full Code [id number]  Harrie Foreman, MD Inpatient       TOTAL TIME TAKING CARE OF THIS PATIENT: 52 minutes.    Saundra Shelling M.D on 08/26/2016 at 4:00 AM  Between 7am to 6pm - Pager - [phone]  After 6pm go to [email or web address] - password [email or web address] [facility]  Office  [phone]  CC: Primary care physician; PROVIDER NOT IN SYSTEM

## 2016-08-26 NOTE — ED Notes (Signed)
[name] (granddaughter) [phone]

## 2016-08-26 NOTE — Discharge Summary (Signed)
[facility] NAME: Yolanda Wells    MR#:  [id number]  DATE OF BIRTH:  1932-10-08  DATE OF ADMISSION:  08/25/2016 ADMITTING PHYSICIAN: Saundra Shelling, MD  DATE OF DISCHARGE: 08/26/2016  PRIMARY CARE PHYSICIAN: PROVIDER NOT IN SYSTEM    ADMISSION DIAGNOSIS:  Vision loss of right eye [H54.61]  DISCHARGE DIAGNOSIS:  Principal Problem:   Vision loss of right eye Active Problems:   Blindness of right eye   SECONDARY DIAGNOSIS:   Past Medical History:  Diagnosis Date  . Arthritis   . Collagen vascular disease ([place])   . Deep venous thrombosis (HCC)    right lower extremity  . Dependent edema   . History of bilateral hip replacements   . History of hysterectomy   . Hypertension   . Polycythemia vera ([place])   . Stroke [facility])    Pt reports she was told she had a stroke in may    HOSPITAL COURSE:   82 y.o. female presenting with right eye vision loss.  1. Right eye vision loss: This appears to be retinal artery occlusion suspected with embolic etiology. MRI and MRA showed no acute changes. Carotid Doppler showed no evidence of hemodynamically significant stenosis. Patient was evaluated in neurology. Patient will continue outpatient medications. Patient will need ophthalmology outpatient follow-up.  2. Urinary tract infection: Patient may be discharged on Ceftin.  3. Polycythemia vera: Patient will continue Hydrea and ASA/  4. History of DVT: Continue Coumadin  DISCHARGE CONDITIONS AND DIET:  Stable Heart healthy diet  CONSULTS OBTAINED:  Treatment Team:  Alexis Goodell, MD Birder Robson, MD  DRUG ALLERGIES:  No Known Allergies  DISCHARGE MEDICATIONS:   Current Discharge Medication List    START taking these medications   Details  cefUROXime (CEFTIN) 500 MG tablet Take 1 tablet (500 mg total) by mouth 2 (two) times daily with a meal. Qty: 8 tablet, Refills: 0      CONTINUE these medications which have  NOT CHANGED   Details  aspirin EC 81 MG tablet Take 1 tablet (81 mg total) by mouth daily. Qty: 30 tablet, Refills: 2    docusate sodium (COLACE) 100 MG capsule Take 100 mg by mouth 2 (two) times daily as needed for mild constipation.     Polyethylene Glycol POWD Take 17 g by mouth daily. Qty: 527 g, Refills: 5    potassium chloride SA (K-DUR,KLOR-CON) 20 MEQ tablet Take 1 tablet (20 mEq total) by mouth 2 (two) times daily. Qty: 30 tablet, Refills: 0    folic acid (FOLVITE) A999333 MCG tablet Take 400 mcg by mouth daily.    !! hydroxyurea (HYDREA) 500 MG capsule Pt takes 4 capsules on Wed, Saturday, and Sunday,take 3 capsules on Monday, Tues Thurs, and Friday. Qty: 96 capsule, Refills: 3    !! hydroxyurea (HYDREA) 500 MG capsule Take 4 capsules 5 days a week and 3 capsules 2 days a week Qty: 100 capsule, Refills: 0    ipratropium-albuterol (DUONEB) 0.5-2.5 (3) MG/3ML SOLN Take 3 mLs by nebulization every 4 (four) hours. Qty: 360 mL, Refills: 6    menthol-cetylpyridinium (CEPACOL) 3 MG lozenge Take 1 lozenge (3 mg total) by mouth as needed for sore throat. Qty: 100 tablet, Refills: 12    !! warfarin (COUMADIN) 1 MG tablet Take 1 tab (1 MG) with a 5 mg tab every Friday for a total dose of 6 mg every Friday. Take a 5 mg tablet Sat - Thursday Qty: 15  tablet, Refills: 1    !! warfarin (COUMADIN) 5 MG tablet Take 1 tablet (5 mg total) by mouth daily. Take 5 mg Sat - Thursday and take 6 mg every Friday Qty: 30 tablet, Refills: 2     !! - Potential duplicate medications found. Please discuss with provider.            Today   CHIEF COMPLAINT:  Doing ok this am had right eye vision loss   VITAL SIGNS:  Blood pressure (!) 157/76, pulse 75, temperature 97.9 F (36.6 C), temperature source Oral, resp. rate 20, height 5\' 7"  (1.702 m), weight 91.9 kg (202 lb 8 oz), SpO2 100 %.   REVIEW OF SYSTEMS:  Review of Systems  Constitutional: Negative.  Negative for chills, fever and  malaise/fatigue.  HENT: Negative.  Negative for ear discharge, ear pain, hearing loss, nosebleeds and sore throat.   Eyes: Negative.  Negative for blurred vision and pain.       Right eye vision loss  Respiratory: Negative.  Negative for cough, hemoptysis, shortness of breath and wheezing.   Cardiovascular: Negative.  Negative for chest pain, palpitations and leg swelling.  Gastrointestinal: Negative.  Negative for abdominal pain, blood in stool, diarrhea, nausea and vomiting.  Genitourinary: Negative.  Negative for dysuria.  Musculoskeletal: Negative.  Negative for back pain.  Skin: Negative.   Neurological: Negative for dizziness, tremors, speech change, focal weakness, seizures and headaches.  Endo/Heme/Allergies: Negative.  Does not bruise/bleed easily.  Psychiatric/Behavioral: Negative.  Negative for depression, hallucinations and suicidal ideas.     PHYSICAL EXAMINATION:  GENERAL:  82 y.o.-year-old patient lying in the bed with no acute distress.  NECK:  Supple, no jugular venous distention. No thyroid enlargement, no tenderness.  LUNGS: Normal breath sounds bilaterally, no wheezing, rales,rhonchi  No use of accessory muscles of respiration.  CARDIOVASCULAR: S1, S2 normal. No murmurs, rubs, or gallops.  ABDOMEN: Soft, non-tender, non-distended. Bowel sounds present. No organomegaly or mass.  EXTREMITIES: No pedal edema, cyanosis, or clubbing.  PSYCHIATRIC: The patient is alert and oriented x 3.  SKIN: No obvious rash, lesion, or ulcer.  Patient has decreased vision from right eye with temporal vision worsen nasal lesion. No other neurological deficits. Strength is 5 out of 5 upper and lower sensation is intact.  DATA REVIEW:   CBC  Recent Labs Lab 08/25/16 2351  WBC 4.9  HGB 13.5  HCT 40.0  PLT 842*    Chemistries   Recent Labs Lab 08/25/16 2351  NA 136  K 4.3  CL 106  CO2 24  GLUCOSE 106*  BUN 16  CREATININE 0.69  CALCIUM 8.7*    Cardiac  Enzymes  Recent Labs Lab 08/25/16 2351  TROPONINI <0.03    Microbiology Results  @[email or web address] @  RADIOLOGY:  Ct Head Wo Contrast  Result Date: 08/26/2016 CLINICAL DATA:  Acute onset of right eye pain, and transient loss of vision. Initial encounter. EXAM: CT HEAD WITHOUT CONTRAST TECHNIQUE: Contiguous axial images were obtained from the base of the skull through the vertex without intravenous contrast. COMPARISON:  CT of the head and MRI of the brain performed 05/25/2015 FINDINGS: Brain: No evidence of acute infarction, hemorrhage, hydrocephalus, extra-axial collection or mass lesion/mass effect. Prominence of the sulci suggests mild cortical volume loss. Cerebellar atrophy is noted. The brainstem and fourth ventricle are within normal limits. The basal ganglia are unremarkable in appearance. The cerebral hemispheres demonstrate grossly normal gray-white differentiation. No mass effect or midline shift is seen. Vascular: No hyperdense vessel  or unexpected calcification. Skull: There is no evidence of fracture; visualized osseous structures are unremarkable in appearance. Sinuses/Orbits: The visualized portions of the orbits are within normal limits. The paranasal sinuses and mastoid air cells are well-aerated. Other: No significant soft tissue abnormalities are seen. IMPRESSION: 1. No acute intracranial pathology seen on CT. 2. Mild cortical volume loss, with significant cerebellar atrophy. Electronically Signed   By: Garald Balding M.D.   On: 08/26/2016 01:18   Mr Brain Wo Contrast  Result Date: 08/26/2016 CLINICAL DATA:  Right-sided vision loss. EXAM: MRI HEAD WITHOUT CONTRAST MRA HEAD WITHOUT CONTRAST TECHNIQUE: Multiplanar, multiecho pulse sequences of the brain and surrounding structures were obtained without intravenous contrast. Angiographic images of the head were obtained using MRA technique without contrast. COMPARISON:  05/25/2015 FINDINGS: MRI HEAD FINDINGS Brain: No acute infarction,  hemorrhage, hydrocephalus, extra-axial collection (prominent extra-axial CSF spaces have traversing vessels) or mass lesion. Atrophy, greatest in the cerebellum and throughout the brainstem. No noted small-vessel ischemic change in the cerebral white matter. No chronic blood products. Vascular: Arterial findings described below. Negative dural venous sinuses. Skull and upper cervical spine: Sclerotic signal in the right frontal bone, measuring up to 2 cm in diameter, stable from 2016 and presumably benign. There is no reported history of malignancy. Sinuses/Orbits: Bilateral cataract resection. No evidence of orbital mass or intra-ocular collection. Other: Intermittently motion degraded exam. MRA HEAD FINDINGS Standard vertebrobasilar and carotid branching. Small if any posterior communicating arteries. Intermittently narrowed appearance of proximal right M2 branches is likely combination of atherosclerosis and artifact at the end of imaging slab. No major branch occlusion or treatable flow limiting stenosis. Negative for aneurysm. IMPRESSION: 1. No acute finding including infarct. 2. Intracranial atherosclerosis without proximal occlusion or flow limiting stenosis. 3. Cerebellar and brainstem predominant atrophy. Electronically Signed   By: Monte Fantasia M.D.   On: 08/26/2016 10:44   US Carotid Bilateral (at [facility] Only)  Result Date: 08/26/2016 CLINICAL DATA:  TIA.  Hypertension, visual disturbance. EXAM: BILATERAL CAROTID DUPLEX ULTRASOUND TECHNIQUE: Pearline Cables scale imaging, color Doppler and duplex ultrasound was performed of bilateral carotid and vertebral arteries in the neck. COMPARISON:  05/25/2015 TECHNIQUE: Quantification of carotid stenosis is based on velocity parameters that correlate the residual internal carotid diameter with NASCET-based stenosis levels, using the diameter of the distal internal carotid lumen as the denominator for stenosis measurement. The following velocity measurements were  obtained: PEAK SYSTOLIC/END DIASTOLIC RIGHT ICA:                     75/22cm/sec CCA:                     [id number] SYSTOLIC ICA/CCA RATIO:  1.1 DIASTOLIC ICA/CCA RATIO: 1.6 ECA:                     37cm/sec LEFT ICA:                     63/18cm/sec CCA:                     [id number] SYSTOLIC ICA/CCA RATIO:  1.0 DIASTOLIC ICA/CCA RATIO: 1.7 ECA:                     42cm/sec FINDINGS: RIGHT CAROTID ARTERY: Eccentric partially calcified plaque in the carotid bulb and proximal internal and external carotid arteries. No high-grade stenosis. Normal waveforms and color Doppler signal. RIGHT VERTEBRAL ARTERY:  Normal flow direction and waveform. LEFT CAROTID ARTERY: Minimal plaque in the carotid bulb. No significant stenosis. Normal waveforms and color Doppler signal. LEFT VERTEBRAL ARTERY: Normal flow direction and waveform. IMPRESSION: 1. Mild bilateral carotid bifurcation plaque, right greater than left, resulting in less than 50% diameter stenosis. The exam does not exclude plaque ulceration or embolization. Continued surveillance recommended. 2.  Antegrade bilateral vertebral arterial flow. Electronically Signed   By: Lucrezia Europe M.D.   On: 08/26/2016 09:55   Mr Jodene Nam Head/brain [id number] Cm  Result Date: 08/26/2016 CLINICAL DATA:  Right-sided vision loss. EXAM: MRI HEAD WITHOUT CONTRAST MRA HEAD WITHOUT CONTRAST TECHNIQUE: Multiplanar, multiecho pulse sequences of the brain and surrounding structures were obtained without intravenous contrast. Angiographic images of the head were obtained using MRA technique without contrast. COMPARISON:  05/25/2015 FINDINGS: MRI HEAD FINDINGS Brain: No acute infarction, hemorrhage, hydrocephalus, extra-axial collection (prominent extra-axial CSF spaces have traversing vessels) or mass lesion. Atrophy, greatest in the cerebellum and throughout the brainstem. No noted small-vessel ischemic change in the cerebral white matter. No chronic blood products. Vascular: Arterial findings  described below. Negative dural venous sinuses. Skull and upper cervical spine: Sclerotic signal in the right frontal bone, measuring up to 2 cm in diameter, stable from 2016 and presumably benign. There is no reported history of malignancy. Sinuses/Orbits: Bilateral cataract resection. No evidence of orbital mass or intra-ocular collection. Other: Intermittently motion degraded exam. MRA HEAD FINDINGS Standard vertebrobasilar and carotid branching. Small if any posterior communicating arteries. Intermittently narrowed appearance of proximal right M2 branches is likely combination of atherosclerosis and artifact at the end of imaging slab. No major branch occlusion or treatable flow limiting stenosis. Negative for aneurysm. IMPRESSION: 1. No acute finding including infarct. 2. Intracranial atherosclerosis without proximal occlusion or flow limiting stenosis. 3. Cerebellar and brainstem predominant atrophy. Electronically Signed   By: Monte Fantasia M.D.   On: 08/26/2016 10:44      Management plans discussed with the patient and she is in agreement. Stable for discharge   Patient should follow up with pcp  CODE STATUS:     Code Status Orders        Start     Ordered   08/26/16 0446  Full code  Continuous     08/26/16 0445    Code Status History    Date Active Date Inactive Code Status Order ID Comments User Context   09/22/2015  5:55 PM 09/25/2015  6:44 PM Full Code [id number]  Demetrios Loll, MD ED   09/15/2015  3:41 AM 09/17/2015  5:41 PM Full Code [id number]  Harrie Foreman, MD Inpatient   07/16/2015  1:51 AM 07/20/2015 11:34 PM Full Code [id number]  Lance Coon, MD Inpatient   05/25/2015  8:34 AM 05/29/2015  4:16 PM Full Code [id number]  Harrie Foreman, MD Inpatient   04/02/2015  6:06 AM 04/08/2015  4:51 PM Full Code [id number]  Harrie Foreman, MD Inpatient    Advance Directive Documentation   Flowsheet Row Most Recent Value  Type of Advance Directive  Healthcare Power of Attorney, Living  will  Pre-existing out of facility DNR order (yellow form or pink MOST form)  No data  "MOST" Form in Place?  No data      TOTAL TIME TAKING CARE OF THIS PATIENT: 37 minutes.    Note: This dictation was prepared with Dragon dictation along with smaller phrase technology. Any transcriptional errors that result from this process are unintentional.  Kimyetta Flott M.D on 08/26/2016  at 12:11 PM  Between 7am to 6pm - Pager - [phone] After 6pm go to [email or web address] - password [email or web address] [facility]  Office  [phone]  CC: Primary care physician; PROVIDER NOT IN SYSTEM

## 2016-08-26 NOTE — Progress Notes (Signed)
Patient discharged home per MD order. All discharge instructions given to patient and granddaughter. EMS called for transportation.

## 2016-08-26 NOTE — Progress Notes (Signed)
SLP Cancellation Note  Patient Details Name: Yolanda Wells MRN: [id number] DOB: [birth year]   Cancelled treatment:       Reason Eval/Treat Not Completed: SLP screened, no needs identified, will sign off. Chart was reviewed. Pt and Granddaughter reported no swallowing deficits during breakfast meal or swallowing pills. Nursing consulted and agreed. Pt states her speech still seems "slurred" to her, Granddaughter notes she believes Pt is fatigued due to night in hospital. During screening Pt was easy to understand. MD consulted with Pt and agrees stating Pt was 100% intelligible. Pt was given information and education on the importance of rest to reduce fatigue, and was recommended to follow up with primary MD at home and given the contact information to consult ST services as a outpatient if she continues to feel there is any change in her speech.       Rivka Safer, B.A. Clinical Graduate Student 08/26/2016, 1:56 PM

## 2016-08-27 LAB — HEMOGLOBIN A1C
Hgb A1c MFr Bld: 5.3 % (ref 4.8–5.6)
MEAN PLASMA GLUCOSE: 105 mg/dL

## 2016-08-27 LAB — ECHOCARDIOGRAM COMPLETE
Height: 67 in
Weight: 3240 oz

## 2016-08-29 ENCOUNTER — Telehealth: Payer: Self-pay | Admitting: *Deleted

## 2016-08-29 NOTE — Telephone Encounter (Signed)
Received call from Dorthy Cooler, RN providing home health care for patient. Offering to do PT/INR in home if needed. Is aware that patient has appointment next week. Please contact him if this is needed at [phone].

## 2016-09-01 ENCOUNTER — Other Ambulatory Visit: Payer: Self-pay | Admitting: *Deleted

## 2016-09-01 DIAGNOSIS — D45 Polycythemia vera: Secondary | ICD-10-CM

## 2016-09-02 ENCOUNTER — Telehealth: Payer: Self-pay | Admitting: *Deleted

## 2016-09-02 ENCOUNTER — Inpatient Hospital Stay (HOSPITAL_BASED_OUTPATIENT_CLINIC_OR_DEPARTMENT_OTHER): Payer: Medicare Other | Admitting: Hematology and Oncology

## 2016-09-02 ENCOUNTER — Inpatient Hospital Stay: Payer: Medicare Other

## 2016-09-02 ENCOUNTER — Other Ambulatory Visit: Payer: Self-pay | Admitting: *Deleted

## 2016-09-02 ENCOUNTER — Other Ambulatory Visit: Payer: Self-pay

## 2016-09-02 VITALS — BP 150/89 | HR 71 | Temp 96.3°F | Resp 18 | Wt 202.0 lb

## 2016-09-02 DIAGNOSIS — H5461 Unqualified visual loss, right eye, normal vision left eye: Secondary | ICD-10-CM

## 2016-09-02 DIAGNOSIS — I2699 Other pulmonary embolism without acute cor pulmonale: Secondary | ICD-10-CM

## 2016-09-02 DIAGNOSIS — D45 Polycythemia vera: Secondary | ICD-10-CM | POA: Diagnosis not present

## 2016-09-02 DIAGNOSIS — Z79899 Other long term (current) drug therapy: Secondary | ICD-10-CM

## 2016-09-02 DIAGNOSIS — Z600 Problems of adjustment to life-cycle transitions: Secondary | ICD-10-CM

## 2016-09-02 DIAGNOSIS — Z7901 Long term (current) use of anticoagulants: Secondary | ICD-10-CM

## 2016-09-02 DIAGNOSIS — R32 Unspecified urinary incontinence: Secondary | ICD-10-CM

## 2016-09-02 DIAGNOSIS — I10 Essential (primary) hypertension: Secondary | ICD-10-CM

## 2016-09-02 DIAGNOSIS — I82409 Acute embolism and thrombosis of unspecified deep veins of unspecified lower extremity: Secondary | ICD-10-CM

## 2016-09-02 DIAGNOSIS — Z7982 Long term (current) use of aspirin: Secondary | ICD-10-CM

## 2016-09-02 DIAGNOSIS — Z8673 Personal history of transient ischemic attack (TIA), and cerebral infarction without residual deficits: Secondary | ICD-10-CM

## 2016-09-02 DIAGNOSIS — Z86718 Personal history of other venous thrombosis and embolism: Secondary | ICD-10-CM

## 2016-09-02 DIAGNOSIS — Z86711 Personal history of pulmonary embolism: Secondary | ICD-10-CM

## 2016-09-02 HISTORY — DX: Unspecified urinary incontinence: R32

## 2016-09-02 LAB — CBC WITH DIFFERENTIAL/PLATELET
Basophils Absolute: 0 10*3/uL (ref 0–0.1)
Basophils Relative: 1 %
Eosinophils Absolute: 0.1 10*3/uL (ref 0–0.7)
Eosinophils Relative: 2 %
HCT: 41.8 % (ref 35.0–47.0)
Hemoglobin: 13.9 g/dL (ref 12.0–16.0)
Lymphocytes Relative: 27 %
Lymphs Abs: 0.9 10*3/uL — ABNORMAL LOW (ref 1.0–3.6)
MCH: 30.3 pg (ref 26.0–34.0)
MCHC: 33.2 g/dL (ref 32.0–36.0)
MCV: 91.4 fL (ref 80.0–100.0)
Monocytes Absolute: 0.2 10*3/uL (ref 0.2–0.9)
Monocytes Relative: 5 %
Neutro Abs: 2.2 10*3/uL (ref 1.4–6.5)
Neutrophils Relative %: 65 %
Platelets: 777 10*3/uL — ABNORMAL HIGH (ref 150–440)
RBC: 4.57 MIL/uL (ref 3.80–5.20)
RDW: 30.9 % — ABNORMAL HIGH (ref 11.5–14.5)
WBC: 3.4 10*3/uL — ABNORMAL LOW (ref 3.6–11.0)

## 2016-09-02 LAB — PROTIME-INR
INR: 2.21
Prothrombin Time: 24.9 seconds — ABNORMAL HIGH (ref 11.4–15.2)

## 2016-09-02 LAB — COMPREHENSIVE METABOLIC PANEL
ALT: 9 U/L — ABNORMAL LOW (ref 14–54)
AST: 17 U/L (ref 15–41)
Albumin: 3.3 g/dL — ABNORMAL LOW (ref 3.5–5.0)
Alkaline Phosphatase: 77 U/L (ref 38–126)
Anion gap: 7 (ref 5–15)
BUN: 11 mg/dL (ref 6–20)
CO2: 23 mmol/L (ref 22–32)
Calcium: 8.9 mg/dL (ref 8.9–10.3)
Chloride: 107 mmol/L (ref 101–111)
Creatinine, Ser: 0.46 mg/dL (ref 0.44–1.00)
GFR calc Af Amer: >60 mL/min (ref >60)
GFR calc non Af Amer: >60 mL/min (ref >60)
Glucose, Bld: 104 mg/dL — ABNORMAL HIGH (ref 65–99)
Potassium: 4.1 mmol/L (ref 3.5–5.1)
Sodium: 137 mmol/L (ref 135–145)
Total Bilirubin: 0.9 mg/dL (ref 0.3–1.2)
Total Protein: 7.5 g/dL (ref 6.5–8.1)

## 2016-09-02 NOTE — Progress Notes (Signed)
Patient's BP slightly elevated 150/89 HR 71.  Patient states she did not get breakfast this morning.  She lives at [facility] and breakfast is usually served around 9:30.  She did not take her BP meds this morning because she had not eaten.

## 2016-09-02 NOTE — Patient Instructions (Signed)
Anagrelide capsules What is this medicine? ANAGRELIDE (an AG re lide) is used to lower platelet counts. This helps to prevent blood clots from forming. It also reduces the risk of problems like intestinal bleeding, stroke, and heart attack caused by having too many platelets. This medicine may be used for other purposes; ask your health care provider or pharmacist if you have questions. What should I tell my health care provider before I take this medicine? They need to know if you have any of these conditions: -heart disease -kidney disease -liver disease -an unusual or allergic reaction to anagrelide, other medicines, foods, dyes, or preservatives -pregnant or trying to get pregnant -breast-feeding How should I use this medicine? Take this medicine by mouth with a glass of water. Follow the directions on the prescription label. Take your medicine at regular intervals. Do not take your medicine more often than directed. Do not stop taking except on the advice of your doctor or health care professional. Talk to your pediatrician regarding the use of this medicine in children. While this medicine may be prescribed for children as young as 55 years of age for selected conditions, precautions do apply. Overdosage: If you think you have taken too much of this medicine contact a poison control center or emergency room at once. NOTE: This medicine is only for you. Do not share this medicine with others. What if I miss a dose? If you miss a dose, take it as soon as you can. If it is almost time for your next dose, take only that dose. Do not take double or extra doses. What may interact with this medicine? Do not take this medicine with any of the following medications: -cisapride -dofetilide -dronedarone -pimozide -thioridazine -ziprasidone This medicine may also interact with the following medications: -aspirin and aspirin-like drugs -cilostazol -fluvoxamine -medicines called inotropes like  milrinone, enoximone, amrinone, and olprinone -medicines that treat or prevent blood clots like warfarin -sucralfate -theophylline This list may not describe all possible interactions. Give your health care provider a list of all the medicines, herbs, non-prescription drugs, or dietary supplements you use. Also tell them if you smoke, drink alcohol, or use illegal drugs. Some items may interact with your medicine. What should I watch for while using this medicine? Visit your doctor or health care professional for regular checks on your progress. Notify your doctor or health care professional and seek emergency treatment if you develop breathing problems; changes in vision; chest pain; severe, sudden headache; pain, swelling, warmth in the leg; trouble speaking; sudden numbness or weakness of the face, arm or leg. These can be signs that your condition has gotten worse. This medicine can make you more sensitive to the sun. Keep out of the sun. If you cannot avoid being in the sun, wear protective clothing and use sunscreen. Do not use sun lamps or tanning beds/booths. What side effects may I notice from receiving this medicine? Side effects that you should report to your doctor or health care professional as soon as possible: -allergic reactions like skin rash, itching or hives, swelling of the face, lips, or tongue -dizziness -fast or irregular heartbeat -feeling faint or lightheaded, falls -seizures -signs and symptoms of bleeding such as bloody or black, tarry stools; red or dark-brown urine; spitting up blood or brown material that looks like coffee grounds; red spots on the skin; unusual bruising or bleeding from the eye, gums, or nose -trouble passing urine or change in the amount of urine -unusually weak or tired Side  effects that usually do not require medical attention (report to your doctor or health care professional if they continue or are bothersome): -abdominal pain -back  pain -diarrhea -gas -headache -loss of appetite -nausea, vomiting This list may not describe all possible side effects. Call your doctor for medical advice about side effects. You may report side effects to FDA at 1-800-FDA-1088. Where should I keep my medicine? Keep out of the reach of children. Store at room temperature between 15 and 30 degrees C (59 and 86 degrees F). Protect from light. Throw away any unused medicine after the expiration date. NOTE: This sheet is a summary. It may not cover all possible information. If you have questions about this medicine, talk to your doctor, pharmacist, or health care provider.    2016, [employer]vier/Gold Standard. (2013-02-22 16:29:11)

## 2016-09-02 NOTE — Progress Notes (Signed)
[facility] day:  09/02/2016   Chief Complaint: Yolanda Wells is a 82 y.o. female with polycythemia rubra vera (PV) who is seen for 1 month assessment on hydroxyurea and Coumadin and possible phlebotomy.  HPI:   The patient was last seen in the medical oncology clinic on 08/05/2016.  At that time, she felt good.  Exam revealed no appreciable hepatosplenomegaly.  CBC revealed a hematocrit of 43.1, platelets 753,000, WBC 3,000 with an [place] 1900.  INR was 3.3 on Coumadin 6 mg 5 days a week and 5 mg 2 days a week (total weekly dose 40 mg).  She underwent phlebotomy.  Coumadin was decreased to 5 mg 5 days/week and 6 mg 2 days/week (total weekly dose 37 mg).  Hydroxyurea was increased to 2000 mg 3 days/week and 1500 mg 4 days/week (total weekly dose: 24 pills).  INR was 3.76 on 08/12/2016, 2.84 on 08/19/2016, and 2.59 on 08/25/2016.  She was admitted on 08/25/2016 with loss of vision in her right eye.  CBC on 08/25/2016 revealed a hematocrit of 40.0, hemoglobin 13.5, MCV 91, platelets 842,000, WBC 4900.  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  MRI and MRA showed no acute changes. Carotid Doppler showed no evidence of hemodynamically significant stenosis. She was evaluated in neurology. Patient was to follow-up with ophthalmology as an outpatient.  She is taking hydroxyurea 3 pills a day (21 pills/week).  She is taking Coumadin 5 mg 5 days/week and 4 mg 2 days/week (total weekly dose 33 mg).  Symptomatically, she notes decreased vision in her right eye, "shadows".  She has had a brief change in stool consistency.  She is receiving physical therapy.  She sees her primary care physician today.   Past Medical History:  Diagnosis Date  . Arthritis   . Collagen vascular disease ([place])   . Deep venous thrombosis (H[facility])    right lower extremity  . Dependent edema   . History of bilateral hip replacements   . History  of hysterectomy   . Hypertension   . Polycythemia vera ([place])   . Stroke [facility])    Pt reports she was told she had a stroke in may    Past Surgical History:  Procedure Laterality Date  . ABDOMINAL HYSTERECTOMY    . CARDIAC CATHETERIZATION Right 04/03/2015   Procedure: CENTRAL LINE INSERTION;  Surgeon: Sherri Rad, MD;  Location: [facility] ORS;  Service: General;  Laterality: Right;  . COLECTOMY WITH COLOSTOMY CREATION/HARTMANN PROCEDURE N/A 04/03/2015   Procedure: COLECTOMY WITH COLOSTOMY CREATION/HARTMANN PROCEDURE;  Surgeon: Sherri Rad, MD;  Location: [facility] ORS;  Service: General;  Laterality: N/A;  . JOINT REPLACEMENT     Left and Right Hip  . PERIPHERAL VASCULAR CATHETERIZATION N/A 04/02/2015   Procedure: Visceral Angiography;  Surgeon: Algernon Huxley, MD;  Location: [place] [facility];  Service: Cardiovascular;  Laterality: N/A;  . PERIPHERAL VASCULAR CATHETERIZATION N/A 04/02/2015   Procedure: Visceral Artery Intervention;  Surgeon: Algernon Huxley, MD;  Location: [place] [facility];  Service: Cardiovascular;  Laterality: N/A;  . PERIPHERAL VASCULAR CATHETERIZATION N/A 05/25/2015   Procedure: IVC Filter Insertion;  Surgeon: Katha Cabal, MD;  Location: [place] [facility];  Service: Cardiovascular;  Laterality: N/A;  . TOE AMPUTATION     right    Family History  Problem Relation Age of Onset  . Heart attack Father   . COPD Father   . Hypertension    .  Arthritis-Osteo    . Diabetes Brother   . Osteosarcoma Son     Social History:  reports that she has never smoked. She has never used smokeless tobacco. She reports that she does not drink alcohol or use drugs.  She lives alone on [address] at [facility] (independent living).  Her grand-daughter visits regularly [name] [phone]).  The patient is accompanied by her grand-daughter today.  Allergies: No Known Allergies  Current Medications: Current Outpatient Prescriptions  Medication Sig Dispense Refill  . aspirin EC 81 MG  tablet Take 1 tablet (81 mg total) by mouth daily. 30 tablet 2  . cefUROXime (CEFTIN) 500 MG tablet Take 1 tablet (500 mg total) by mouth 2 (two) times daily with a meal. 8 tablet 0  . docusate sodium (COLACE) 100 MG capsule Take 100 mg by mouth 2 (two) times daily as needed for mild constipation.     . folic acid (FOLVITE) 376 MCG tablet Take 400 mcg by mouth daily.    . hydroxyurea (HYDREA) 500 MG capsule Pt takes 4 capsules on Wed, Saturday, and Sunday,take 3 capsules on Monday, Tues Thurs, and Friday. 96 capsule 3  . hydroxyurea (HYDREA) 500 MG capsule Take 4 capsules 5 days a week and 3 capsules 2 days a week 100 capsule 0  . ipratropium-albuterol (DUONEB) 0.5-2.5 (3) MG/3ML SOLN Take 3 mLs by nebulization every 4 (four) hours. 360 mL 6  . menthol-cetylpyridinium (CEPACOL) 3 MG lozenge Take 1 lozenge (3 mg total) by mouth as needed for sore throat. 100 tablet 12  . Polyethylene Glycol POWD Take 17 g by mouth daily. 527 g 5  . potassium chloride SA (K-DUR,KLOR-CON) 20 MEQ tablet Take 1 tablet (20 mEq total) by mouth 2 (two) times daily. 30 tablet 0  . warfarin (COUMADIN) 4 MG tablet Take 4 mg by mouth daily.    [name] warfarin (COUMADIN) 5 MG tablet Take 5 mg by mouth daily.     No current facility-administered medications for this visit.     Review of Systems:  GENERAL:  Feels "ok".  No fevers or sweats.  Weight down 7 pounds. PERFORMANCE STATUS (ECOG):  3 HEENT:  Decreased vision in right eye s/p embolic event.  No runny nose, sore throat, mouth sores or tenderness. Lungs:  No shortness of breath or cough.  No hemoptysis. Cardiac:  No chest pain, palpitations, orthopnea, or PND. GI:  Brief change in stool consistency.  No nausea, vomiting, diarrhea, constipation, melena or hematochezia. GU:  No urgency, frequency, dysuria, or hematuria.  Wears adult diapers. Musculoskeletal:  Knee problems ("bone on bone").  No muscle tenderness. Extremities:  Chronic swelling in legs. Skin:  Dry skin.  No  rashes or skin changes. Neuro:  No headache, numbness or weakness, balance or coordination issues. Endocrine:  No diabetes, thyroid issues, hot flashes or night sweats. Psych:  No mood changes, depression or anxiety. Pain:  No focal pain. Review of systems:  All other systems reviewed and found to be negative.  Physical Exam: Blood pressure (!) 150/89, pulse 71, temperature (!) 96.3 F (35.7 C), temperature source Tympanic, resp. rate 18, weight 202 lb (91.6 kg). GENERAL:  Elderly woman sitting comfortably in a wheelchair in the exam room in no acute distress. MENTAL STATUS:  Alert and oriented to person, place and time. HEAD: Pearline Cables hair.  Normocephalic, atraumatic, face symmetric, no Cushingoid features. EYES:  Brown eyes.  Pupils equal round and reactive to light and accomodation.  Decreased vision right eye.  No  conjunctivitis or scleral icterus. ENT:  Oropharynx clear without lesion.  Upper dentures.  Tongue normal. Mucous membranes moist.  RESPIRATORY:  Clear to auscultation without rales, wheezes or rhonchi. CARDIOVASCULAR:  Regular rate and rhythm without murmur, rub or gallop. ABDOMEN:  Left sided colostomy.  Soft, non-tender, with active bowel sounds, and no appreciable hepatosplenomegaly.  No masses. SKIN:  No ulcers or lesions. EXTREMITIES:  Chronic lower extremity changes.  No skin discoloration or tenderness.  No palpable cords. LYMPH NODES: No palpable cervical, supraclavicular, axillary or inguinal adenopathy  NEUROLOGICAL: Unremarkable. PSYCH:  Appropriate.   Orders Only on 09/02/2016  Component Date Value Ref Range Status  . WBC 09/02/2016 3.4* 3.6 - 11.0 K/uL Final  . RBC 09/02/2016 4.57  3.80 - 5.20 MIL/uL Final  . Hemoglobin 09/02/2016 13.9  12.0 - 16.0 g/dL Final  . HCT 09/02/2016 41.8  35.0 - 47.0 % Final  . MCV 09/02/2016 91.4  80.0 - 100.0 fL Final  . MCH 09/02/2016 30.3  26.0 - 34.0 pg Final  . MCHC 09/02/2016 33.2  32.0 - 36.0 g/dL Final  . RDW 09/02/2016  30.9* 11.5 - 14.5 % Final  . Platelets 09/02/2016 777* 150 - 440 K/uL Final  . Neutrophils Relative % 09/02/2016 65  % Final  . Neutro Abs 09/02/2016 2.2  1.4 - 6.5 K/uL Final  . Lymphocytes Relative 09/02/2016 27  % Final  . Lymphs Abs 09/02/2016 0.9* 1.0 - 3.6 K/uL Final  . Monocytes Relative 09/02/2016 5  % Final  . Monocytes Absolute 09/02/2016 0.2  0.2 - 0.9 K/uL Final  . Eosinophils Relative 09/02/2016 2  % Final  . Eosinophils Absolute 09/02/2016 0.1  0 - 0.7 K/uL Final  . Basophils Relative 09/02/2016 1  % Final  . Basophils Absolute 09/02/2016 0.0  0 - 0.1 K/uL Final  . Sodium 09/02/2016 137  135 - 145 mmol/L Final  . Potassium 09/02/2016 4.1  3.5 - 5.1 mmol/L Final  . Chloride 09/02/2016 107  101 - 111 mmol/L Final  . CO2 09/02/2016 23  22 - 32 mmol/L Final  . Glucose, Bld 09/02/2016 104* 65 - 99 mg/dL Final  . BUN 09/02/2016 11  6 - 20 mg/dL Final  . Creatinine, Ser 09/02/2016 0.46  0.44 - 1.00 mg/dL Final  . Calcium 09/02/2016 8.9  8.9 - 10.3 mg/dL Final  . Total Protein 09/02/2016 7.5  6.5 - 8.1 g/dL Final  . Albumin 09/02/2016 3.3* 3.5 - 5.0 g/dL Final  . AST 09/02/2016 17  15 - 41 U/L Final  . ALT 09/02/2016 9* 14 - 54 U/L Final  . Alkaline Phosphatase 09/02/2016 77  38 - 126 U/L Final  . Total Bilirubin 09/02/2016 0.9  0.3 - 1.2 mg/dL Final  . GFR calc non Af Amer 09/02/2016 >60  >60 mL/min Final  . GFR calc Af Amer 09/02/2016 >60  >60 mL/min Final   Comment: (NOTE) The eGFR has been calculated using the CKD EPI equation. This calculation has not been validated in all clinical situations. eGFR's persistently <60 mL/min signify possible Chronic Kidney Disease.   . Anion gap 09/02/2016 7  5 - 15 Final    Assessment:  Yolanda Wells is a 82 y.o. female African-American woman with JAK2+ polycythemia rubra vera (PV) previously on a phlebotomy program and hydroxyurea. She received P32 in an attempt to manage her counts in 03/2015.   Course has been complicated by a  cerebellar CVA on 04/01/2015, splenic flexure bleeding requiring micro-embolization then colectomy on 04/03/2015.  She was diagnosed with bilateral lower extremity DVTs on 05/18/2015 and bilateral pulmonary emboli on 05/24/2015. She underwent IVC filter placement on 05/25/2015.  She is on a fluctuating dose of Coumadin (total weekly dose 36 mg).  She is on a baby aspirin.  She developed progressive erythrocytosis, thrombocytosis, and leukocytosis.  She underwent phlebotomy for a hematocrit of 55.4 on 09/23/2015.  Hematocrit decreased to 50.0, but has again increased after initiation of oral iron.  Platelet count has increased from 1.1 million to 1.4 million.  White count increased from 23,000 - 28,000 to 31,800.  She was admitted on 08/25/2016 with loss of vision in her right eye.  CBC on 08/25/2016 revealed a hematocrit of 40.0, hemoglobin 13.5, MCV 91, platelets 842,000, WBC 4900.  INR was 2.59.  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  MRI and MRA showed no acute changes.  She has been back on hydroxyurea since 10/02/2015.  Initial dose was 1000 mg a day.  She is currently taking 3 pills 7 days/week (total weekly dose: 21 pills).   She requires periodic phlebotomies (goal hematocrit 42).  Platelet count remains elevated (secondary to PV and likely some component of iron deficiency).  Goal platelet count is 400,000.  She is on Coumadin 5 mg 5 days/week and 4 mg 2 days/week (total weekly dose 33 mg).  INR goal 2-3.  Symptomatically, she notes visual changes in her right eye.  Exam reveals no appreciable hepatosplenomegaly.  Hematocrit is 41.8.  Platelet count is 777,000.  WBC is 3,400 ([place] 2200).  INR is 2.21.  Plan: 1.  Labs today:  CBC with diff, CMP, PT/INR. 2.  Discuss recent events.  Review interval imaging studies.   3.  Continue current dose of Coumadin (total weekly dose 33 mg). 4.  Discuss dosing of hydroxyurea (discrepancy between recorded  dosing of 24 pills/week and actual dose of 21 pills/week).  Platelet count goal of 400,000.  Unclear if platelet count can remain below goal of 400,000 and maintain adequate WBC/ANC.  Discuss consideration of a switch to anagrelide.  Side effects reviewed.  Information provided.  Platelet count has improved from 842,000 to 777,000 in past week.  Discuss increasing hydroxyurea to 23 pills/week. 5.  RTC in 1 week for labs (CBC with diff, PT/INR) +/- phlebotomy. 6.  RTC in 2 weeks for labs (CBC with diff, PT/INR). 7.  RTC in 3 weeks for labs (CBC with diff, PT/INR). 8.  RTC in 4 weeks for MD assessment, labs (CBC with diff, CMP, PT/INR) +/- phlebotomy.   Lequita Asal, MD  09/02/2016, 11:10 AM

## 2016-09-02 NOTE — Telephone Encounter (Signed)
Pt was still in the exam room when PT/INR was back and I went in and told grand-daughter and pt that she stay on same dose because the INR is over 2

## 2016-09-02 NOTE — Telephone Encounter (Signed)
-----   Message from Lequita Asal, MD sent at 09/02/2016  1:29 PM EDT ----- Regarding: Please call grand-daughter  INR good.  No change in Coumadin dose.  M  ----- Message ----- From: Interface, Lab In [place] Sent: 09/02/2016   9:44 AM To: Lequita Asal, MD

## 2016-09-08 ENCOUNTER — Telehealth: Payer: Self-pay | Admitting: *Deleted

## 2016-09-08 NOTE — Telephone Encounter (Signed)
Merry Proud was in pt's home for home health and did PT?INR and results were 33.2 and INR 2.8 and based on those numbers there is no change in coumadin dose.  Merry Proud also asked if pt needs to come tom and I said yes because she had cbc to determine if she needs phlebotomy.  He can draw cbc with diff and do PT/INR at her house on 11/6 and 11/13. He will fax results from [facility]. Dr. Mike Gip  Approved this and Merry Proud will start  it next week

## 2016-09-09 ENCOUNTER — Inpatient Hospital Stay: Payer: Medicare Other

## 2016-09-09 DIAGNOSIS — D45 Polycythemia vera: Secondary | ICD-10-CM

## 2016-09-09 DIAGNOSIS — I2699 Other pulmonary embolism without acute cor pulmonale: Secondary | ICD-10-CM

## 2016-09-09 DIAGNOSIS — I82409 Acute embolism and thrombosis of unspecified deep veins of unspecified lower extremity: Secondary | ICD-10-CM

## 2016-09-09 LAB — CBC WITH DIFFERENTIAL/PLATELET
Basophils Absolute: 0 10*3/uL (ref 0–0.1)
Basophils Relative: 1 %
Eosinophils Absolute: 0.1 10*3/uL (ref 0–0.7)
Eosinophils Relative: 2 %
HCT: 40.4 % (ref 35.0–47.0)
Hemoglobin: 13.5 g/dL (ref 12.0–16.0)
Lymphocytes Relative: 25 %
Lymphs Abs: 1 10*3/uL (ref 1.0–3.6)
MCH: 30.3 pg (ref 26.0–34.0)
MCHC: 33.4 g/dL (ref 32.0–36.0)
MCV: 90.7 fL (ref 80.0–100.0)
Monocytes Absolute: 0 10*3/uL — ABNORMAL LOW (ref 0.2–0.9)
Monocytes Relative: 0 %
Neutro Abs: 3 10*3/uL (ref 1.4–6.5)
Neutrophils Relative %: 72 %
Platelets: 748 10*3/uL — ABNORMAL HIGH (ref 150–440)
RBC: 4.45 MIL/uL (ref 3.80–5.20)
RDW: 28.3 % — ABNORMAL HIGH (ref 11.5–14.5)
WBC: 4.1 10*3/uL (ref 3.6–11.0)

## 2016-09-09 LAB — PROTIME-INR
INR: 2.17
Prothrombin Time: 24.5 seconds — ABNORMAL HIGH (ref 11.4–15.2)

## 2016-09-11 ENCOUNTER — Telehealth: Payer: Self-pay | Admitting: *Deleted

## 2016-09-11 NOTE — Telephone Encounter (Signed)
Called patient;s granddaughter, [name] to let her know platelets have come down since last visit.  Patient should continue on current dosage of hydroxyurea.  Verbalized understanding.

## 2016-09-11 NOTE — Telephone Encounter (Signed)
-----   Message from Lequita Asal, MD sent at 09/11/2016  1:41 PM EDT ----- Regarding: Please call patient  Platelet count coming down with change made at last visit. Continue current hydroxyurea dosing.  M  ----- Message ----- From: Shirlean Kelly, RN Sent: 09/09/2016   3:28 PM To: Lequita Asal, MD Subject: RE: INR good.                                  Dr. Mike Gip - Check this!!!  Your RN team!!! ----- Message ----- From: Lequita Asal, MD Sent: 09/09/2016  12:12 PM To: Shirlean Kelly, RN, Luella Cook, RN Subject: INR good.                                       Platelets coming down.   I will check and ensure her dose was adjusted last time.  M  ----- Message ----- From: Interface, Lab In [place] Sent: 09/09/2016  10:18 AM To: Lequita Asal, MD

## 2016-09-15 ENCOUNTER — Other Ambulatory Visit: Payer: Self-pay | Admitting: Hematology and Oncology

## 2016-09-16 ENCOUNTER — Other Ambulatory Visit: Payer: Self-pay | Admitting: *Deleted

## 2016-09-16 ENCOUNTER — Inpatient Hospital Stay: Payer: Medicare Other | Attending: Hematology and Oncology

## 2016-09-16 DIAGNOSIS — Z7901 Long term (current) use of anticoagulants: Secondary | ICD-10-CM | POA: Insufficient documentation

## 2016-09-16 DIAGNOSIS — Z933 Colostomy status: Secondary | ICD-10-CM | POA: Insufficient documentation

## 2016-09-16 DIAGNOSIS — Z7982 Long term (current) use of aspirin: Secondary | ICD-10-CM | POA: Insufficient documentation

## 2016-09-16 DIAGNOSIS — Z79899 Other long term (current) drug therapy: Secondary | ICD-10-CM | POA: Insufficient documentation

## 2016-09-16 DIAGNOSIS — Z8673 Personal history of transient ischemic attack (TIA), and cerebral infarction without residual deficits: Secondary | ICD-10-CM | POA: Insufficient documentation

## 2016-09-16 DIAGNOSIS — H349 Unspecified retinal vascular occlusion: Secondary | ICD-10-CM | POA: Insufficient documentation

## 2016-09-16 DIAGNOSIS — Z808 Family history of malignant neoplasm of other organs or systems: Secondary | ICD-10-CM | POA: Insufficient documentation

## 2016-09-16 DIAGNOSIS — Z86718 Personal history of other venous thrombosis and embolism: Secondary | ICD-10-CM | POA: Insufficient documentation

## 2016-09-16 DIAGNOSIS — M1990 Unspecified osteoarthritis, unspecified site: Secondary | ICD-10-CM | POA: Insufficient documentation

## 2016-09-16 DIAGNOSIS — Z86711 Personal history of pulmonary embolism: Secondary | ICD-10-CM | POA: Insufficient documentation

## 2016-09-16 DIAGNOSIS — R600 Localized edema: Secondary | ICD-10-CM | POA: Insufficient documentation

## 2016-09-16 DIAGNOSIS — H5461 Unqualified visual loss, right eye, normal vision left eye: Secondary | ICD-10-CM | POA: Insufficient documentation

## 2016-09-16 DIAGNOSIS — I10 Essential (primary) hypertension: Secondary | ICD-10-CM | POA: Insufficient documentation

## 2016-09-16 DIAGNOSIS — D45 Polycythemia vera: Secondary | ICD-10-CM | POA: Insufficient documentation

## 2016-09-16 MED ORDER — HYDROXYUREA 500 MG PO CAPS: ORAL_CAPSULE | ORAL | 3 refills | Status: DC

## 2016-09-16 MED ORDER — HYDROXYUREA 500 MG PO CAPS: ORAL_CAPSULE | ORAL | 0 refills | Status: DC

## 2016-09-16 NOTE — Telephone Encounter (Signed)
Mail order not send med until after she will be out so wants @24  caps sent to [facility]. E scribed

## 2016-09-17 ENCOUNTER — Telehealth: Payer: Self-pay

## 2016-09-17 NOTE — Telephone Encounter (Signed)
Patient's [name] called in and states that the patient is having foul smelling liquid stools from her colostomy and generalized malaise feeling. Denies abdominal pain, nausea/vomiting, blood in stool, and fever/chills. This started yesterday and has continued today. Patient's diet has not changed and patient has not received any laxatives.  I encouraged Miss Yolanda Wells to keep patient hydrated as well as possible and this is most likely viral due to both diarrhea and overall not feeling well. She was instructed to go to the ED if dehydration occurs but most likely this will pass with virus.  She was instructed to call back if this is no better after 5 days.

## 2016-09-23 ENCOUNTER — Inpatient Hospital Stay: Payer: Medicare Other

## 2016-09-24 ENCOUNTER — Other Ambulatory Visit: Payer: Self-pay | Admitting: Hematology and Oncology

## 2016-09-25 ENCOUNTER — Telehealth: Payer: Self-pay | Admitting: *Deleted

## 2016-09-25 NOTE — Telephone Encounter (Signed)
Yolanda Wells had called with plt count of 935, he will send rest of results over later today. He also said the fingerstick PT/INR was PT-33.2 INR-2.8. I told him that we would need to make changes to hydrea and I will call granddaughter to get the current regimen fo rmed.  I called grandaughter later in evening and she is not sure of what the dose is because she is not at her house. She will cal lme back and let me know what the doses are.

## 2016-09-27 ENCOUNTER — Encounter: Payer: Self-pay | Admitting: Hematology and Oncology

## 2016-09-30 ENCOUNTER — Encounter: Payer: Self-pay | Admitting: Hematology and Oncology

## 2016-09-30 ENCOUNTER — Telehealth: Payer: Self-pay | Admitting: *Deleted

## 2016-09-30 ENCOUNTER — Inpatient Hospital Stay (HOSPITAL_BASED_OUTPATIENT_CLINIC_OR_DEPARTMENT_OTHER): Payer: Medicare Other | Admitting: Hematology and Oncology

## 2016-09-30 ENCOUNTER — Inpatient Hospital Stay: Payer: Medicare Other

## 2016-09-30 VITALS — BP 155/89 | HR 76 | Temp 95.3°F | Resp 18

## 2016-09-30 DIAGNOSIS — Z86711 Personal history of pulmonary embolism: Secondary | ICD-10-CM

## 2016-09-30 DIAGNOSIS — Z8673 Personal history of transient ischemic attack (TIA), and cerebral infarction without residual deficits: Secondary | ICD-10-CM | POA: Diagnosis not present

## 2016-09-30 DIAGNOSIS — Z808 Family history of malignant neoplasm of other organs or systems: Secondary | ICD-10-CM

## 2016-09-30 DIAGNOSIS — Z79899 Other long term (current) drug therapy: Secondary | ICD-10-CM | POA: Diagnosis not present

## 2016-09-30 DIAGNOSIS — R600 Localized edema: Secondary | ICD-10-CM

## 2016-09-30 DIAGNOSIS — Z933 Colostomy status: Secondary | ICD-10-CM | POA: Diagnosis not present

## 2016-09-30 DIAGNOSIS — Z86718 Personal history of other venous thrombosis and embolism: Secondary | ICD-10-CM

## 2016-09-30 DIAGNOSIS — H5461 Unqualified visual loss, right eye, normal vision left eye: Secondary | ICD-10-CM | POA: Diagnosis not present

## 2016-09-30 DIAGNOSIS — Z7982 Long term (current) use of aspirin: Secondary | ICD-10-CM

## 2016-09-30 DIAGNOSIS — I10 Essential (primary) hypertension: Secondary | ICD-10-CM | POA: Diagnosis not present

## 2016-09-30 DIAGNOSIS — H349 Unspecified retinal vascular occlusion: Secondary | ICD-10-CM

## 2016-09-30 DIAGNOSIS — Z7901 Long term (current) use of anticoagulants: Secondary | ICD-10-CM

## 2016-09-30 DIAGNOSIS — M1990 Unspecified osteoarthritis, unspecified site: Secondary | ICD-10-CM

## 2016-09-30 DIAGNOSIS — D45 Polycythemia vera: Secondary | ICD-10-CM

## 2016-09-30 DIAGNOSIS — I82409 Acute embolism and thrombosis of unspecified deep veins of unspecified lower extremity: Secondary | ICD-10-CM

## 2016-09-30 LAB — CBC WITH DIFFERENTIAL/PLATELET
Basophils Absolute: 0 10*3/uL (ref 0–0.1)
Basophils Relative: 1 %
Eosinophils Absolute: 0.1 10*3/uL (ref 0–0.7)
Eosinophils Relative: 2 %
HCT: 41 % (ref 35.0–47.0)
Hemoglobin: 13.8 g/dL (ref 12.0–16.0)
Lymphocytes Relative: 32 %
Lymphs Abs: 1.1 10*3/uL (ref 1.0–3.6)
MCH: 30.4 pg (ref 26.0–34.0)
MCHC: 33.6 g/dL (ref 32.0–36.0)
MCV: 90.6 fL (ref 80.0–100.0)
Monocytes Absolute: 0.1 10*3/uL — ABNORMAL LOW (ref 0.2–0.9)
Monocytes Relative: 3 %
Neutro Abs: 2.2 10*3/uL (ref 1.4–6.5)
Neutrophils Relative %: 62 %
Platelets: 677 10*3/uL — ABNORMAL HIGH (ref 150–440)
RBC: 4.52 MIL/uL (ref 3.80–5.20)
RDW: 24 % — ABNORMAL HIGH (ref 11.5–14.5)
WBC: 3.5 10*3/uL — ABNORMAL LOW (ref 3.6–11.0)

## 2016-09-30 LAB — PROTIME-INR
INR: 2.69
Prothrombin Time: 29.1 seconds — ABNORMAL HIGH (ref 11.4–15.2)

## 2016-09-30 NOTE — Telephone Encounter (Signed)
Called patient's daughter, [name] and LVM that INR is good.  MD recommends continuing with current dose of coumadin.

## 2016-09-30 NOTE — Telephone Encounter (Signed)
-----   Message from Lequita Asal, MD sent at 09/30/2016  1:01 PM EST ----- Regarding: Please call and daughter  INR good.  Continue current dose of Coumadin  M ----- Message ----- From: Interface, Lab In [place] Sent: 09/30/2016   9:20 AM To: Lequita Asal, MD

## 2016-09-30 NOTE — Progress Notes (Signed)
[facility] [facility] day:  09/30/2016   Chief Complaint: Abriona Sinner is a 82 y.o. female with polycythemia rubra vera (PV) who is seen for 1 month assessment on hydroxyurea and Coumadin and possible phlebotomy.  HPI:   The patient was last seen in the medical oncology clinic on 09/02/2016.  At that time, she noted right eye visual changes s/p retinal artery occlusion.  INR was 2.21 on Coumadin 5 mg 5 days/week and 4 mg 2 days/week (total weekly dose 33 mg).  CBC revealed a hematocrit 41.8, platelet count 777,000, and WBC 3,400 with an [place] 2200.  She was inadvertantly only taking hydroxyurea 3 pills a day (21 pills/week).  Dose was increased to 23 pills/week.  She has had labs drawn weekly through [facility] through [place] on 09/16/2016 revealed a hematocrit 38.9, hemoglobin 12.7, MCV 89, platelets 933,000, white count 3900 with an ANC of 2300. CBC on 09/22/2016 revealed a hematocrit of 39.2, hemoglobin 13, MCV 89, platelets 935,000, white count 4500 with an ANC of 2800.   The patient's granddaughter was contacted.  Given her recent increase in hydroxyurea at the beginning of the month, hydroxyurea was increased by one pill/week to 4 tablets 3 days a week and 3 tablets 4 days a week (total weekly dose 24 pills).  Symptomatically, the patient has had an interval history of "being sick". She had significant stool output although "not diarrhea". She stated that her colostomy bag filled up faster. She was told that she had a "GI bug".   Her Coumadin dose has remained the same.  She has had no bruising or bleeding.  The patient granddaughter notes that on 10/23/2016 she is having her right eyelid fixed as it is turning inward.   Past Medical History:  Diagnosis Date  . Arthritis   . Collagen vascular disease ([place])   . Deep venous thrombosis ([facility])    right lower extremity  . Dependent edema   . History of bilateral hip replacements   . History of  hysterectomy   . Hypertension   . Polycythemia vera ([place])   . Stroke [facility])    Pt reports she was told she had a stroke in may    Past Surgical History:  Procedure Laterality Date  . ABDOMINAL HYSTERECTOMY    . CARDIAC CATHETERIZATION Right 04/03/2015   Procedure: CENTRAL LINE INSERTION;  Surgeon: Sherri Rad, MD;  Location: [facility] ORS;  Service: General;  Laterality: Right;  . COLECTOMY WITH COLOSTOMY CREATION/HARTMANN PROCEDURE N/A 04/03/2015   Procedure: COLECTOMY WITH COLOSTOMY CREATION/HARTMANN PROCEDURE;  Surgeon: Sherri Rad, MD;  Location: [facility] ORS;  Service: General;  Laterality: N/A;  . JOINT REPLACEMENT     Left and Right Hip  . PERIPHERAL VASCULAR CATHETERIZATION N/A 04/02/2015   Procedure: Visceral Angiography;  Surgeon: Algernon Huxley, MD;  Location: [place] [facility];  Service: Cardiovascular;  Laterality: N/A;  . PERIPHERAL VASCULAR CATHETERIZATION N/A 04/02/2015   Procedure: Visceral Artery Intervention;  Surgeon: Algernon Huxley, MD;  Location: [place] [facility];  Service: Cardiovascular;  Laterality: N/A;  . PERIPHERAL VASCULAR CATHETERIZATION N/A 05/25/2015   Procedure: IVC Filter Insertion;  Surgeon: Katha Cabal, MD;  Location: [place] [facility];  Service: Cardiovascular;  Laterality: N/A;  . TOE AMPUTATION     right    Family History  Problem Relation Age of Onset  . Heart attack Father   . COPD Father   . Hypertension    .  Arthritis-Osteo    . Diabetes Brother   . Osteosarcoma Son     Social History:  reports that she has never smoked. She has never used smokeless tobacco. She reports that she does not drink alcohol or use drugs.  She lives alone on [address] at [facility] (independent living).  [facility] provides home health care services.  Her grand-daughter visits regularly [name] [phone]).  The patient is accompanied by her grand-daughter today.  Allergies: No Known Allergies  Current Medications: Current Outpatient Prescriptions   Medication Sig Dispense Refill  . aspirin EC 81 MG tablet Take 1 tablet (81 mg total) by mouth daily. 30 tablet 2  . docusate sodium (COLACE) 100 MG capsule Take 100 mg by mouth 2 (two) times daily as needed for mild constipation.     . folic acid (FOLVITE) A999333 MCG tablet Take 400 mcg by mouth daily.    . hydroxyurea (HYDREA) 500 MG capsule Pt takes 4 capsules on Wed, Saturday, and Sunday,take 3 capsules on Monday, Tues Thurs, and Friday. 24 capsule 0  . ipratropium-albuterol (DUONEB) 0.5-2.5 (3) MG/3ML SOLN Take 3 mLs by nebulization every 4 (four) hours. 360 mL 6  . menthol-cetylpyridinium (CEPACOL) 3 MG lozenge Take 1 lozenge (3 mg total) by mouth as needed for sore throat. 100 tablet 12  . Polyethylene Glycol POWD Take 17 g by mouth daily. 527 g 5  . potassium chloride SA (K-DUR,KLOR-CON) 20 MEQ tablet Take 1 tablet (20 mEq total) by mouth 2 (two) times daily. 30 tablet 0  . warfarin (COUMADIN) 4 MG tablet Take 4 mg by mouth daily.    [name] warfarin (COUMADIN) 5 MG tablet Take 5 mg by mouth daily.    . cefUROXime (CEFTIN) 500 MG tablet Take 1 tablet (500 mg total) by mouth 2 (two) times daily with a meal. (Patient not taking: Reported on 09/30/2016) 8 tablet 0  . warfarin (COUMADIN) 5 MG tablet TAKE 1 TABLET (5MG ) BY MOUTH SAT- THURS AND TAKE 6MG  EVERY FRIDAY. (Patient not taking: Reported on 09/30/2016) 30 tablet 1   No current facility-administered medications for this visit.     Review of Systems:  GENERAL:  Feels "fine".  No fevers or sweats.  No new weight. PERFORMANCE STATUS (ECOG):  3 HEENT:  Decreased vision in right eye s/p embolic event.  Right eyelid turning in.  No runny nose, sore throat, mouth sores or tenderness. Lungs:  No shortness of breath or cough.  No hemoptysis. Cardiac:  No chest pain, palpitations, orthopnea, or PND. GI:  Interval increase stool output (see HPI) now back to normal.  No nausea, vomiting, diarrhea, constipation, melena or hematochezia. GU:  No urgency,  frequency, dysuria, or hematuria.  Wears adult diapers. Musculoskeletal:  Knee problems ("bone on bone").  No muscle tenderness. Extremities:  Chronic swelling in legs. Skin:  Dry skin.  No rashes or skin changes. Neuro:  No headache, numbness or weakness, balance or coordination issues. Endocrine:  No diabetes, thyroid issues, hot flashes or night sweats. Psych:  No mood changes, depression or anxiety. Pain:  No focal pain. Review of systems:  All other systems reviewed and found to be negative.  Physical Exam: Blood pressure (!) 159/83, pulse 76, temperature (!) 95.3 F (35.2 C), temperature source Tympanic, resp. rate 18. GENERAL:  Elderly woman sitting comfortably in a wheelchair in the exam room in no acute distress. MENTAL STATUS:  Alert and oriented to person, place and time. HEAD: Pearline Cables hair.  Normocephalic, atraumatic, face symmetric,  no Cushingoid features. EYES:  Brown eyes.  Right sided entropion.  Pupils equal round and reactive to light and accomodation.  Decreased vision right eye.  No conjunctivitis or scleral icterus. ENT:  Oropharynx clear without lesion.  Upper dentures.  Tongue normal. Mucous membranes moist.  RESPIRATORY:  Clear to auscultation without rales, wheezes or rhonchi. CARDIOVASCULAR:  Regular rate and rhythm without murmur, rub or gallop. ABDOMEN:  Left sided colostomy.  Soft, non-tender, with active bowel sounds, and no appreciable hepatosplenomegaly.  No masses. SKIN:  No ulcers or lesions. EXTREMITIES:  Chronic lower extremity changes.  No skin discoloration or tenderness.  No palpable cords. LYMPH NODES: No palpable cervical, supraclavicular, axillary or inguinal adenopathy  NEUROLOGICAL: Unremarkable. PSYCH:  Appropriate.   Appointment on 09/30/2016  Component Date Value Ref Range Status  . WBC 09/30/2016 3.5* 3.6 - 11.0 K/uL Final  . RBC 09/30/2016 4.52  3.80 - 5.20 MIL/uL Final  . Hemoglobin 09/30/2016 13.8  12.0 - 16.0 g/dL Final  . HCT  09/30/2016 41.0  35.0 - 47.0 % Final  . MCV 09/30/2016 90.6  80.0 - 100.0 fL Final  . MCH 09/30/2016 30.4  26.0 - 34.0 pg Final  . MCHC 09/30/2016 33.6  32.0 - 36.0 g/dL Final  . RDW 09/30/2016 24.0* 11.5 - 14.5 % Final  . Platelets 09/30/2016 677* 150 - 440 K/uL Final  . Neutrophils Relative % 09/30/2016 62  % Final  . Neutro Abs 09/30/2016 2.2  1.4 - 6.5 K/uL Final  . Lymphocytes Relative 09/30/2016 32  % Final  . Lymphs Abs 09/30/2016 1.1  1.0 - 3.6 K/uL Final  . Monocytes Relative 09/30/2016 3  % Final  . Monocytes Absolute 09/30/2016 0.1* 0.2 - 0.9 K/uL Final  . Eosinophils Relative 09/30/2016 2  % Final  . Eosinophils Absolute 09/30/2016 0.1  0 - 0.7 K/uL Final  . Basophils Relative 09/30/2016 1  % Final  . Basophils Absolute 09/30/2016 0.0  0 - 0.1 K/uL Final    Assessment:  Yolanda Wells is a 82 y.o. female African-American woman with JAK2+ polycythemia rubra vera (PV) previously on a phlebotomy program and hydroxyurea. She received P32 in an attempt to manage her counts in 03/2015.   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 She was diagnosed with bilateral lower extremity DVTs on 05/18/2015 and bilateral pulmonary emboli on 05/24/2015. She underwent IVC filter placement on 05/25/2015.  She is on a fluctuating dose of Coumadin (total weekly dose 36 mg).  She is on a baby aspirin.  She developed progressive erythrocytosis, thrombocytosis, and leukocytosis.  She underwent phlebotomy for a hematocrit of 55.4 on 09/23/2015.  Hematocrit decreased to 50.0, but has again increased after initiation of oral iron.  Platelet count has increased from 1.1 million to 1.4 million.  White count increased from 23,000 - 28,000 to 31,800.  She was admitted on 08/25/2016 with loss of vision in her right eye.  CBC on 08/25/2016 revealed a hematocrit of 40.0, hemoglobin 13.5, MCV 91, platelets  842,000, WBC 4900.  INR was 2.59.  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  MRI and MRA showed no acute changes.  She has been back on hydroxyurea since 10/02/2015.  Initial dose was 1000 mg a day.  She is currently taking 4 pills 3 days/week and 3 pills 4 days/week (total weekly dose: 24 pills).   She requires periodic phlebotomies (goal hematocrit <= 42).  Platelet count remains elevated (secondary to PV and likely some component of iron deficiency).  Goal platelet count is 400,000.  She is on Coumadin 5 mg 5 days/week and 4 mg 2 days/week (total weekly dose 33 mg).  INR goal 2-3.  Symptomatically, she notes stable visual changes in her right eye.  Exam reveals right sided entropion.  Hematocrit is 41.0.  Platelet count is 677,000 (improving).  WBC is 3,500 ([place] 2200).  INR is 2.69.  Plan: 1.  Labs today:  CBC with diff, CMP, PT/INR. 2.  Continue current dose of hydroxyurea (24 pills/week). 3.  Continue current dose of Coumadin (total weekly dose 33 mg). 4.  No phlebotomy today 5.  Continue labs through home health.  Next lab draw (CBC with diff, PT/INR) in 2 weeks. 6.  RTC in 1 month for MD assessment and labs (CBC with diff, CMP, PT/INR) +/- phlebotomy.   Lequita Asal, MD  09/30/2016, 10:19 AM

## 2016-10-06 ENCOUNTER — Telehealth: Payer: Self-pay | Admitting: *Deleted

## 2016-10-06 NOTE — Telephone Encounter (Signed)
Merry Proud called to ask about labs for pt this week . I told him that she has made orders for 2 weeks from last week so she will need labs done next tues. He will draw them next Monday and send cbc to [place] via fax

## 2016-10-13 ENCOUNTER — Telehealth: Payer: Self-pay | Admitting: *Deleted

## 2016-10-13 NOTE — Telephone Encounter (Signed)
Dorthy Cooler RN from [facility] reported Pt 32.6 and INR of 2.7.  [address] notified, ordered to stay on current dose of coumadin.  Patient notified to stay on current dose of coumadin, voiced understanding.

## 2016-10-17 ENCOUNTER — Telehealth: Payer: Self-pay | Admitting: *Deleted

## 2016-10-17 DIAGNOSIS — D45 Polycythemia vera: Secondary | ICD-10-CM

## 2016-10-17 NOTE — Telephone Encounter (Signed)
Called [name] and left a message stating that plt are higher than last blood draw the count was 876 and [name] would need to increase the hydrea to 4 capsules 4 days a week and 3 capsules 3 days a week.  Because her hct if 43.7 she will probably need a phlebotomy.  I have made an appt for 12/11 at 2 pm for labs  And poss. Phlebotomy if needed.  Please call me to make sure you get the message and have any questions.

## 2016-10-20 ENCOUNTER — Inpatient Hospital Stay: Payer: Medicare Other | Attending: Hematology and Oncology

## 2016-10-20 ENCOUNTER — Inpatient Hospital Stay: Payer: Medicare Other

## 2016-10-20 DIAGNOSIS — M1990 Unspecified osteoarthritis, unspecified site: Secondary | ICD-10-CM | POA: Insufficient documentation

## 2016-10-20 DIAGNOSIS — H02003 Unspecified entropion of right eye, unspecified eyelid: Secondary | ICD-10-CM | POA: Insufficient documentation

## 2016-10-20 DIAGNOSIS — Z7901 Long term (current) use of anticoagulants: Secondary | ICD-10-CM | POA: Insufficient documentation

## 2016-10-20 DIAGNOSIS — G4700 Insomnia, unspecified: Secondary | ICD-10-CM | POA: Insufficient documentation

## 2016-10-20 DIAGNOSIS — Z8673 Personal history of transient ischemic attack (TIA), and cerebral infarction without residual deficits: Secondary | ICD-10-CM | POA: Insufficient documentation

## 2016-10-20 DIAGNOSIS — Z79899 Other long term (current) drug therapy: Secondary | ICD-10-CM | POA: Insufficient documentation

## 2016-10-20 DIAGNOSIS — Z86718 Personal history of other venous thrombosis and embolism: Secondary | ICD-10-CM | POA: Insufficient documentation

## 2016-10-20 DIAGNOSIS — Z7982 Long term (current) use of aspirin: Secondary | ICD-10-CM | POA: Insufficient documentation

## 2016-10-20 DIAGNOSIS — Z90710 Acquired absence of both cervix and uterus: Secondary | ICD-10-CM | POA: Insufficient documentation

## 2016-10-20 DIAGNOSIS — R600 Localized edema: Secondary | ICD-10-CM | POA: Insufficient documentation

## 2016-10-20 DIAGNOSIS — D45 Polycythemia vera: Secondary | ICD-10-CM | POA: Insufficient documentation

## 2016-10-20 DIAGNOSIS — Z86711 Personal history of pulmonary embolism: Secondary | ICD-10-CM | POA: Insufficient documentation

## 2016-10-20 DIAGNOSIS — Z96643 Presence of artificial hip joint, bilateral: Secondary | ICD-10-CM | POA: Insufficient documentation

## 2016-10-20 DIAGNOSIS — I10 Essential (primary) hypertension: Secondary | ICD-10-CM | POA: Insufficient documentation

## 2016-10-28 ENCOUNTER — Inpatient Hospital Stay: Payer: Medicare Other

## 2016-10-29 ENCOUNTER — Other Ambulatory Visit: Payer: Self-pay

## 2016-10-29 ENCOUNTER — Telehealth: Payer: Self-pay | Admitting: *Deleted

## 2016-10-29 ENCOUNTER — Other Ambulatory Visit: Payer: Self-pay | Admitting: *Deleted

## 2016-10-29 ENCOUNTER — Inpatient Hospital Stay: Payer: Medicare Other

## 2016-10-29 DIAGNOSIS — Z8673 Personal history of transient ischemic attack (TIA), and cerebral infarction without residual deficits: Secondary | ICD-10-CM | POA: Diagnosis not present

## 2016-10-29 DIAGNOSIS — D45 Polycythemia vera: Secondary | ICD-10-CM | POA: Diagnosis not present

## 2016-10-29 DIAGNOSIS — Z79899 Other long term (current) drug therapy: Secondary | ICD-10-CM | POA: Diagnosis not present

## 2016-10-29 DIAGNOSIS — Z7901 Long term (current) use of anticoagulants: Secondary | ICD-10-CM | POA: Diagnosis not present

## 2016-10-29 DIAGNOSIS — I82409 Acute embolism and thrombosis of unspecified deep veins of unspecified lower extremity: Secondary | ICD-10-CM

## 2016-10-29 DIAGNOSIS — Z86711 Personal history of pulmonary embolism: Secondary | ICD-10-CM | POA: Diagnosis not present

## 2016-10-29 DIAGNOSIS — G4700 Insomnia, unspecified: Secondary | ICD-10-CM | POA: Diagnosis not present

## 2016-10-29 DIAGNOSIS — Z7982 Long term (current) use of aspirin: Secondary | ICD-10-CM | POA: Diagnosis not present

## 2016-10-29 DIAGNOSIS — Z86718 Personal history of other venous thrombosis and embolism: Secondary | ICD-10-CM | POA: Diagnosis not present

## 2016-10-29 DIAGNOSIS — R600 Localized edema: Secondary | ICD-10-CM | POA: Diagnosis not present

## 2016-10-29 DIAGNOSIS — Z96643 Presence of artificial hip joint, bilateral: Secondary | ICD-10-CM | POA: Diagnosis not present

## 2016-10-29 DIAGNOSIS — M1990 Unspecified osteoarthritis, unspecified site: Secondary | ICD-10-CM | POA: Diagnosis not present

## 2016-10-29 DIAGNOSIS — H02003 Unspecified entropion of right eye, unspecified eyelid: Secondary | ICD-10-CM | POA: Diagnosis not present

## 2016-10-29 DIAGNOSIS — Z90710 Acquired absence of both cervix and uterus: Secondary | ICD-10-CM | POA: Diagnosis not present

## 2016-10-29 DIAGNOSIS — I10 Essential (primary) hypertension: Secondary | ICD-10-CM | POA: Diagnosis not present

## 2016-10-29 LAB — COMPREHENSIVE METABOLIC PANEL
ALT: 8 U/L — ABNORMAL LOW (ref 14–54)
AST: 24 U/L (ref 15–41)
Albumin: 2.9 g/dL — ABNORMAL LOW (ref 3.5–5.0)
Alkaline Phosphatase: 68 U/L (ref 38–126)
Anion gap: 5 (ref 5–15)
BUN: 11 mg/dL (ref 6–20)
CO2: 25 mmol/L (ref 22–32)
Calcium: 8.6 mg/dL — ABNORMAL LOW (ref 8.9–10.3)
Chloride: 106 mmol/L (ref 101–111)
Creatinine, Ser: 0.61 mg/dL (ref 0.44–1.00)
GFR calc Af Amer: >60 mL/min (ref >60)
GFR calc non Af Amer: >60 mL/min (ref >60)
Glucose, Bld: 156 mg/dL — ABNORMAL HIGH (ref 65–99)
Potassium: 4.2 mmol/L (ref 3.5–5.1)
Sodium: 136 mmol/L (ref 135–145)
Total Bilirubin: 0.7 mg/dL (ref 0.3–1.2)
Total Protein: 7.4 g/dL (ref 6.5–8.1)

## 2016-10-29 LAB — PROTIME-INR
INR: 3.41
Prothrombin Time: 35.2 seconds — ABNORMAL HIGH (ref 11.4–15.2)

## 2016-10-29 NOTE — Telephone Encounter (Signed)
I called [name] because her plt count went to a million and Dr. B on call wanted me to check to see what dose of hydrea was and if she has missed any doses and the current dose is 500 mg and she takes 4 capsules on wed, sat and sun. And takes 3 capsules on mon, tues and thurs.  I told her that I would call her with changes to hydrea but I was waiting to hear back about coumadin level and call her with both and she was agreeable.  I got the INR and it was 3.41 and her current coumadin dose was 5 mg 5 days a week and 4 mg 2 days a week.  Based on labs of plt and INR her new dose is :  Hydrea 4 capsules every day. Coumadin 4 mg 3 days a week, and 5 mg 4 days a week.  I have called [name] back and got her voicemail and left her message with new dosage and asked her to call back with a message that she understands direction and she will leave me the understanding of what she heard to verify we are on the same page and understand. I also told her that she already has appt to see md 12/28 11 am and it needs to add on lab and come at [place].

## 2016-10-29 NOTE — Telephone Encounter (Signed)
[place] from lab called at 11:04am with critical lab platelet resulted at 1,057, repeated and resulted at 1,056.  Notified Dr B since Dr Patsy Baltimore is out of the office today

## 2016-10-31 LAB — CBC WITH DIFFERENTIAL/PLATELET
Basophils Absolute: 0 10*3/uL (ref 0–0.1)
Basophils Relative: 1 %
Eosinophils Absolute: 0.1 10*3/uL (ref 0–0.7)
Eosinophils Relative: 3 %
HCT: 41.5 % (ref 35.0–47.0)
Hemoglobin: 13.7 g/dL (ref 12.0–16.0)
Lymphocytes Relative: 20 %
Lymphs Abs: 0.8 10*3/uL — ABNORMAL LOW (ref 1.0–3.6)
MCH: 29.3 pg (ref 26.0–34.0)
MCHC: 33 g/dL (ref 32.0–36.0)
MCV: 88.9 fL (ref 80.0–100.0)
Monocytes Absolute: 0.1 10*3/uL — ABNORMAL LOW (ref 0.2–0.9)
Monocytes Relative: 3 %
Neutro Abs: 3.1 10*3/uL (ref 1.4–6.5)
Neutrophils Relative %: 73 %
Platelets: 1057 10*3/uL (ref 150–400)
RBC: 4.67 MIL/uL (ref 3.80–5.20)
RDW: 26.4 % — ABNORMAL HIGH (ref 11.5–14.5)
WBC: 4.2 10*3/uL (ref 3.6–11.0)

## 2016-11-06 ENCOUNTER — Telehealth: Payer: Self-pay | Admitting: *Deleted

## 2016-11-06 ENCOUNTER — Inpatient Hospital Stay: Payer: Medicare Other

## 2016-11-06 ENCOUNTER — Inpatient Hospital Stay (HOSPITAL_BASED_OUTPATIENT_CLINIC_OR_DEPARTMENT_OTHER): Payer: Medicare Other | Admitting: Hematology and Oncology

## 2016-11-06 ENCOUNTER — Other Ambulatory Visit: Payer: Self-pay

## 2016-11-06 VITALS — BP 132/70 | HR 71 | Temp 95.7°F | Resp 18

## 2016-11-06 DIAGNOSIS — D45 Polycythemia vera: Secondary | ICD-10-CM | POA: Diagnosis not present

## 2016-11-06 DIAGNOSIS — Z7982 Long term (current) use of aspirin: Secondary | ICD-10-CM

## 2016-11-06 DIAGNOSIS — Z90710 Acquired absence of both cervix and uterus: Secondary | ICD-10-CM

## 2016-11-06 DIAGNOSIS — H02003 Unspecified entropion of right eye, unspecified eyelid: Secondary | ICD-10-CM | POA: Diagnosis not present

## 2016-11-06 DIAGNOSIS — R600 Localized edema: Secondary | ICD-10-CM

## 2016-11-06 DIAGNOSIS — Z86711 Personal history of pulmonary embolism: Secondary | ICD-10-CM

## 2016-11-06 DIAGNOSIS — Z86718 Personal history of other venous thrombosis and embolism: Secondary | ICD-10-CM | POA: Diagnosis not present

## 2016-11-06 DIAGNOSIS — I82509 Chronic embolism and thrombosis of unspecified deep veins of unspecified lower extremity: Secondary | ICD-10-CM

## 2016-11-06 DIAGNOSIS — M1990 Unspecified osteoarthritis, unspecified site: Secondary | ICD-10-CM

## 2016-11-06 DIAGNOSIS — G4700 Insomnia, unspecified: Secondary | ICD-10-CM

## 2016-11-06 DIAGNOSIS — I10 Essential (primary) hypertension: Secondary | ICD-10-CM | POA: Diagnosis not present

## 2016-11-06 DIAGNOSIS — Z79899 Other long term (current) drug therapy: Secondary | ICD-10-CM

## 2016-11-06 DIAGNOSIS — I2782 Chronic pulmonary embolism: Secondary | ICD-10-CM

## 2016-11-06 DIAGNOSIS — Z96643 Presence of artificial hip joint, bilateral: Secondary | ICD-10-CM

## 2016-11-06 DIAGNOSIS — Z8673 Personal history of transient ischemic attack (TIA), and cerebral infarction without residual deficits: Secondary | ICD-10-CM

## 2016-11-06 DIAGNOSIS — Z7901 Long term (current) use of anticoagulants: Secondary | ICD-10-CM | POA: Diagnosis not present

## 2016-11-06 LAB — COMPREHENSIVE METABOLIC PANEL
ALT: 6 U/L — ABNORMAL LOW (ref 14–54)
AST: 16 U/L (ref 15–41)
Albumin: 3.1 g/dL — ABNORMAL LOW (ref 3.5–5.0)
Alkaline Phosphatase: 65 U/L (ref 38–126)
Anion gap: 7 (ref 5–15)
BUN: 12 mg/dL (ref 6–20)
CO2: 26 mmol/L (ref 22–32)
Calcium: 8.9 mg/dL (ref 8.9–10.3)
Chloride: 104 mmol/L (ref 101–111)
Creatinine, Ser: 0.63 mg/dL (ref 0.44–1.00)
GFR calc Af Amer: >60 mL/min (ref >60)
GFR calc non Af Amer: >60 mL/min (ref >60)
Glucose, Bld: 99 mg/dL (ref 65–99)
Potassium: 4.2 mmol/L (ref 3.5–5.1)
Sodium: 137 mmol/L (ref 135–145)
Total Bilirubin: 0.6 mg/dL (ref 0.3–1.2)
Total Protein: 7.5 g/dL (ref 6.5–8.1)

## 2016-11-06 LAB — CBC WITH DIFFERENTIAL/PLATELET
Basophils Absolute: 0 10*3/uL (ref 0–0.1)
Basophils Relative: 1 %
Eosinophils Absolute: 0.1 10*3/uL (ref 0–0.7)
Eosinophils Relative: 3 %
HCT: 41.7 % (ref 35.0–47.0)
Hemoglobin: 14.1 g/dL (ref 12.0–16.0)
Lymphocytes Relative: 23 %
Lymphs Abs: 0.7 10*3/uL — ABNORMAL LOW (ref 1.0–3.6)
MCH: 29.7 pg (ref 26.0–34.0)
MCHC: 33.8 g/dL (ref 32.0–36.0)
MCV: 87.8 fL (ref 80.0–100.0)
Monocytes Absolute: 0.1 10*3/uL — ABNORMAL LOW (ref 0.2–0.9)
Monocytes Relative: 4 %
Neutro Abs: 2.2 10*3/uL (ref 1.4–6.5)
Neutrophils Relative %: 69 %
Platelets: 942 10*3/uL (ref 150–400)
RBC: 4.75 MIL/uL (ref 3.80–5.20)
RDW: 30.1 % — ABNORMAL HIGH (ref 11.5–14.5)
WBC: 3.2 10*3/uL — ABNORMAL LOW (ref 3.6–11.0)

## 2016-11-06 LAB — PROTIME-INR
INR: 4.39
Prothrombin Time: 43.1 seconds — ABNORMAL HIGH (ref 11.4–15.2)

## 2016-11-06 NOTE — Telephone Encounter (Signed)
Left message for [name] to have grandmother hold coumadin dosing tonight and call back for dosing instructions at the [place] number.

## 2016-11-06 NOTE — Telephone Encounter (Signed)
Granddaughter bessie returned call, explained new dosing of coumadin to granddaughter, Hold tonight's dose then start Coumadin tomorrow  New dose: 4 mg 6 days per week and 5 mg 1 day/week (total weekly dose 29 mg).  she was able to repeat it correctly back, voiced understanding.

## 2016-11-06 NOTE — Telephone Encounter (Signed)
Critical Lab:  Platelets -  [address] them - [phone]  MD notified.

## 2016-11-06 NOTE — Telephone Encounter (Signed)
Critical Labs:  PT 43.1 INR - 4.39 MD notified.

## 2016-11-06 NOTE — Progress Notes (Signed)
[facility] day:  11/06/2016   Chief Complaint: Yolanda Wells is a 82 y.o. female with polycythemia rubra vera (PV) who is seen for 1 month assessment on hydroxyurea and Coumadin and possible phlebotomy.  HPI:   The patient was last seen in the medical oncology clinic on 09/30/2016.  At that time,  She noted stable visual changes in her right eye.  Exam revealed right sided entropion.  Hematocrit was 41.0.  Platelet count was 677,000 (improving).  WBC was 3,500 ([place] 2200).  INR was 2.69.  She continued hydroxyurea (24 pills/week) and Coumadin (total weekly dose 33 mg).  CBC on 10/29/2016 revealed a hematocrit of 41.5, hemoglobin 13.7, MCV 88.9, platelets 1.057 million, WBC 4200 with an ANC of 3100.  INR was 3.41.  Her hydroxyurea was increased to 28 pills/week (4 capsules/day).  Her Coumadin was decreased to 4 mg 3 days/week and 5 mg 4 days/week (total weekly dose 32 mg).  Symptomatically, she is doing "about the same".  She denies any mouth sores or tenderness.  She denies any diarrhea or recent antibiotics.  She denies any excess bruising or bleeding.  She has surgery planned on her right eye at [facility] on 01/15/2017.   Past Medical History:  Diagnosis Date  . Arthritis   . Collagen vascular disease ([place])   . Deep venous thrombosis (H[facility])    right lower extremity  . Dependent edema   . History of bilateral hip replacements   . History of hysterectomy   . Hypertension   . Polycythemia vera ([place])   . Stroke [facility])    Pt reports she was told she had a stroke in may    Past Surgical History:  Procedure Laterality Date  . ABDOMINAL HYSTERECTOMY    . CARDIAC CATHETERIZATION Right 04/03/2015   Procedure: CENTRAL LINE INSERTION;  Surgeon: Sherri Rad, MD;  Location: [facility] ORS;  Service: General;  Laterality: Right;  . COLECTOMY WITH COLOSTOMY CREATION/HARTMANN PROCEDURE N/A 04/03/2015   Procedure: COLECTOMY WITH COLOSTOMY  CREATION/HARTMANN PROCEDURE;  Surgeon: Sherri Rad, MD;  Location: [facility] ORS;  Service: General;  Laterality: N/A;  . JOINT REPLACEMENT     Left and Right Hip  . PERIPHERAL VASCULAR CATHETERIZATION N/A 04/02/2015   Procedure: Visceral Angiography;  Surgeon: Algernon Huxley, MD;  Location: [place] CV LAB;  Service: Cardiovascular;  Laterality: N/A;  . PERIPHERAL VASCULAR CATHETERIZATION N/A 04/02/2015   Procedure: Visceral Artery Intervention;  Surgeon: Algernon Huxley, MD;  Location: [place] [facility] LAB;  Service: Cardiovascular;  Laterality: N/A;  . PERIPHERAL VASCULAR CATHETERIZATION N/A 05/25/2015   Procedure: IVC Filter Insertion;  Surgeon: Katha Cabal, MD;  Location: [place] CV LAB;  Service: Cardiovascular;  Laterality: N/A;  . TOE AMPUTATION     right    Family History  Problem Relation Age of Onset  . Heart attack Father   . COPD Father   . Hypertension    . Arthritis-Osteo    . Diabetes Brother   . Osteosarcoma Son     Social History:  reports that she has never smoked. She has never used smokeless tobacco. She reports that she does not drink alcohol or use drugs.  She lives alone on [address] at [facility] (independent living).  [facility] provides home health care services.  Her grand-daughter visits regularly [name] [phone]).  Her grand-daughter is sick on antibiotics at home.  She is alone today.  Allergies: No Known Allergies  Current Medications: Current Outpatient Prescriptions  Medication Sig Dispense Refill  . aspirin EC 81 MG tablet Take 1 tablet (81 mg total) by mouth daily. 30 tablet 2  . docusate sodium (COLACE) 100 MG capsule Take 100 mg by mouth 2 (two) times daily as needed for mild constipation.     . folic acid (FOLVITE) 253 MCG tablet Take 400 mcg by mouth daily.    . hydroxyurea (HYDREA) 500 MG capsule Pt takes 4 capsules on Wed, Saturday, and Sunday,take 3 capsules on Monday, Tues Thurs, and Friday. 24 capsule 0  .  ipratropium-albuterol (DUONEB) 0.5-2.5 (3) MG/3ML SOLN Take 3 mLs by nebulization every 4 (four) hours. 360 mL 6  . menthol-cetylpyridinium (CEPACOL) 3 MG lozenge Take 1 lozenge (3 mg total) by mouth as needed for sore throat. 100 tablet 12  . Polyethylene Glycol POWD Take 17 g by mouth daily. 527 g 5  . potassium chloride SA (K-DUR,KLOR-CON) 20 MEQ tablet Take 1 tablet (20 mEq total) by mouth 2 (two) times daily. 30 tablet 0  . warfarin (COUMADIN) 4 MG tablet Take 4 mg by mouth daily.    [name] warfarin (COUMADIN) 5 MG tablet Take 5 mg by mouth daily.    . cefUROXime (CEFTIN) 500 MG tablet Take 1 tablet (500 mg total) by mouth 2 (two) times daily with a meal. (Patient not taking: Reported on 11/06/2016) 8 tablet 0  . warfarin (COUMADIN) 5 MG tablet TAKE 1 TABLET ('5MG'$ ) BY MOUTH SAT- THURS AND TAKE '6MG'$  EVERY FRIDAY. (Patient not taking: Reported on 11/06/2016) 30 tablet 1   No current facility-administered medications for this visit.     Review of Systems:  GENERAL:  Feels "about the same".  No fevers or sweats.  No new weight. PERFORMANCE STATUS (ECOG):  3 HEENT:  Decreased vision in right eye s/p embolic event.  Right eyelid turning in (surgery on 01/15/2017).  No runny nose, sore throat, mouth sores or tenderness. Lungs:  No shortness of breath or cough.  No hemoptysis. Cardiac:  No chest pain, palpitations, orthopnea, or PND. GI:  Stool slightly loose last week (no diarrhea).  No nausea, vomiting, diarrhea, constipation, melena or hematochezia. GU:  No urgency, frequency, dysuria, or hematuria.  Wears adult diapers. Musculoskeletal:  Knee problems ("bone on bone").  No muscle tenderness. Extremities:  Chronic swelling in legs. Skin:  Dry skin.  No rashes or skin changes. Neuro:  No headache, numbness or weakness, balance or coordination issues. Endocrine:  No diabetes, thyroid issues, hot flashes or night sweats. Psych:  No mood changes, depression or anxiety.  Poor sleep. Pain:  No focal  pain. Review of systems:  All other systems reviewed and found to be negative.  Physical Exam: Blood pressure 132/70, pulse 71, temperature (!) 95.7 F (35.4 C), temperature source Tympanic, resp. rate 18. GENERAL:  Elderly woman sitting comfortably in a wheelchair in the exam room in no acute distress. MENTAL STATUS:  Alert and oriented to person, place and time. HEAD: Pearline Cables hair.  Normocephalic, atraumatic, face symmetric, no Cushingoid features. EYES:  Brown eyes.  Right sided entropion.  Pupils equal round and reactive to light and accomodation. No conjunctivitis or scleral icterus. ENT:  Oropharynx clear without lesion.  Upper dentures.  Tongue normal. Mucous membranes moist.  RESPIRATORY:  Clear to auscultation without rales, wheezes or rhonchi. CARDIOVASCULAR:  Regular rate and rhythm without murmur, rub or gallop. ABDOMEN:  Left sided colostomy.  Stool brown.  Soft, non-tender, with active bowel sounds, and no appreciable  hepatosplenomegaly.  No masses. SKIN:  No ulcers or lesions. EXTREMITIES:  Chronic lower extremity changes.  No skin discoloration or tenderness.  No palpable cords. LYMPH NODES: No palpable cervical, supraclavicular, axillary or inguinal adenopathy  NEUROLOGICAL: Unremarkable. PSYCH:  Appropriate.   Orders Only on 11/06/2016  Component Date Value Ref Range Status  . WBC 11/06/2016 3.2* 3.6 - 11.0 K/uL Final  . RBC 11/06/2016 4.75  3.80 - 5.20 MIL/uL Final  . Hemoglobin 11/06/2016 14.1  12.0 - 16.0 g/dL Final  . HCT 11/06/2016 41.7  35.0 - 47.0 % Final  . MCV 11/06/2016 87.8  80.0 - 100.0 fL Final  . MCH 11/06/2016 29.7  26.0 - 34.0 pg Final  . MCHC 11/06/2016 33.8  32.0 - 36.0 g/dL Final  . RDW 11/06/2016 30.1* 11.5 - 14.5 % Final  . Platelets 11/06/2016 942* 150 - 440 K/uL Final   Comment: RESULT REPEATED AND VERIFIED CRITICAL RESULT CALLED TO, READ BACK BY AND VERIFIED WITH: Called result to Bruno @ 1116 11/06/2016 PWB   . Neutrophils Relative %  11/06/2016 69  % Final  . Neutro Abs 11/06/2016 2.2  1.4 - 6.5 K/uL Final  . Lymphocytes Relative 11/06/2016 23  % Final  . Lymphs Abs 11/06/2016 0.7* 1.0 - 3.6 K/uL Final  . Monocytes Relative 11/06/2016 4  % Final  . Monocytes Absolute 11/06/2016 0.1* 0.2 - 0.9 K/uL Final  . Eosinophils Relative 11/06/2016 3  % Final  . Eosinophils Absolute 11/06/2016 0.1  0 - 0.7 K/uL Final  . Basophils Relative 11/06/2016 1  % Final  . Basophils Absolute 11/06/2016 0.0  0 - 0.1 K/uL Final  . Sodium 11/06/2016 PENDING  135 - 145 mmol/L Incomplete  . Potassium 11/06/2016 PENDING  3.5 - 5.1 mmol/L Incomplete  . Chloride 11/06/2016 PENDING  101 - 111 mmol/L Incomplete  . CO2 11/06/2016 PENDING  22 - 32 mmol/L Incomplete  . Glucose, Bld 11/06/2016 99  65 - 99 mg/dL Final  . BUN 11/06/2016 12  6 - 20 mg/dL Final  . Creatinine, Ser 11/06/2016 0.63  0.44 - 1.00 mg/dL Final  . Calcium 11/06/2016 8.9  8.9 - 10.3 mg/dL Final  . Total Protein 11/06/2016 7.5  6.5 - 8.1 g/dL Final  . Albumin 11/06/2016 3.1* 3.5 - 5.0 g/dL Final  . AST 11/06/2016 16  15 - 41 U/L Final  . ALT 11/06/2016 6* 14 - 54 U/L Final  . Alkaline Phosphatase 11/06/2016 65  38 - 126 U/L Final  . Total Bilirubin 11/06/2016 0.6  0.3 - 1.2 mg/dL Final  . GFR calc non Af Amer 11/06/2016 >60  >60 mL/min Final  . GFR calc Af Amer 11/06/2016 >60  >60 mL/min Final   Comment: (NOTE) The eGFR has been calculated using the CKD EPI equation. This calculation has not been validated in all clinical situations. eGFR's persistently <60 mL/min signify possible Chronic Kidney Disease.   Georgiann Hahn gap 11/06/2016 PENDING  5 - 15 Incomplete    Assessment:  Yolanda Wells is a 81 y.o. female African-American woman with JAK2+ polycythemia rubra vera (PV) previously on a phlebotomy program and hydroxyurea. She received P32 in an attempt to manage her counts in 03/2015.   Course has been complicated by a cerebellar CVA on 04/01/2015, splenic flexure bleeding  requiring micro-embolization then colectomy on 04/03/2015. She was diagnosed with bilateral lower extremity DVTs on 05/18/2015 and bilateral pulmonary emboli on 05/24/2015. She underwent IVC filter placement on 05/25/2015.  She is on a fluctuating dose  of Coumadin (total weekly dose 36 mg).  She is on a baby aspirin.  She developed progressive erythrocytosis, thrombocytosis, and leukocytosis.  She underwent phlebotomy for a hematocrit of 55.4 on 09/23/2015.  Hematocrit decreased to 50.0, but has again increased after initiation of oral iron.  Platelet count has increased from 1.1 million to 1.4 million.  White count increased from 23,000 - 28,000 to 31,800.  She was admitted on 08/25/2016 with loss of vision in her right eye.  CBC on 08/25/2016 revealed a hematocrit of 40.0, hemoglobin 13.5, MCV 91, platelets 842,000, WBC 4900.  INR was 2.59.  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  MRI and MRA showed no acute changes.  She has been back on hydroxyurea since 10/02/2015.  Initial dose was 1000 mg a day.  Hydroxyurea was increased to 28 pills/week on 10/29/2016.  She requires periodic phlebotomies (goal hematocrit <= 42).  Platelet count remains elevated (secondary to PV and likely some component of iron deficiency).  Goal platelet count is 400,000.  She is on Coumadin 5 mg 5 days/week and 4 mg 2 days/week (total weekly dose 33 mg).  INR goal 2-3.  She has right eye entropion as is scheduled for eye surgery at [facility] on 01/15/2017.  Symptomatically, she denies any diarrhea or new medications.  Exam reveals right sided entropion.  Hematocrit is 41.7.  Platelet count is 942,000 (improving).  WBC is 3,200 ([place] 2200).  INR is 4.39.  Plan: 1.  Labs today:  CBC with diff, CMP, PT/INR. 2.  No phlebotomy today.  Hematocrit < 42. 3.  Discuss erratic INR despite stable dose of Coumadin, no new medications or antibiotics, no diarrhea (slight loose stool  last week), and stable diet. 4.  Hold Coumadin tonight then decrease Coumadin to 4 mg 6 days/week and 5 mg 1 day/week (total weekly dose 29 mg).  Recheck INR in 1 week. 5.  Continue current dose of hydroxyurea as her platelet count is improving from the week prior (1.057 million) when an adjustment was made in her hydroxyurea 6.  RTC in 2 weeks for labs (CBC with diff, INR) 7.  RTC in 4 weeks for MD assessment and labs (CBC with diff, CMP, INR) +/- phlebotomy.   Lequita Asal, MD  11/06/2016, 12:19 PM

## 2016-11-07 ENCOUNTER — Encounter: Payer: Self-pay | Admitting: Hematology and Oncology

## 2016-11-20 ENCOUNTER — Inpatient Hospital Stay: Payer: Medicare HMO | Attending: Hematology and Oncology

## 2016-11-20 ENCOUNTER — Telehealth: Payer: Self-pay | Admitting: *Deleted

## 2016-11-20 DIAGNOSIS — Z5181 Encounter for therapeutic drug level monitoring: Secondary | ICD-10-CM | POA: Insufficient documentation

## 2016-11-20 DIAGNOSIS — Z86711 Personal history of pulmonary embolism: Secondary | ICD-10-CM | POA: Diagnosis not present

## 2016-11-20 DIAGNOSIS — D45 Polycythemia vera: Secondary | ICD-10-CM | POA: Diagnosis not present

## 2016-11-20 DIAGNOSIS — Z86718 Personal history of other venous thrombosis and embolism: Secondary | ICD-10-CM | POA: Insufficient documentation

## 2016-11-20 DIAGNOSIS — I2782 Chronic pulmonary embolism: Secondary | ICD-10-CM

## 2016-11-20 DIAGNOSIS — I82509 Chronic embolism and thrombosis of unspecified deep veins of unspecified lower extremity: Secondary | ICD-10-CM

## 2016-11-20 LAB — CBC WITH DIFFERENTIAL/PLATELET
Basophils Absolute: 0 10*3/uL (ref 0–0.1)
Basophils Relative: 1 %
Eosinophils Absolute: 0.1 10*3/uL (ref 0–0.7)
Eosinophils Relative: 3 %
HCT: 42.2 % (ref 35.0–47.0)
Hemoglobin: 13.9 g/dL (ref 12.0–16.0)
Lymphocytes Relative: 23 %
Lymphs Abs: 0.9 10*3/uL — ABNORMAL LOW (ref 1.0–3.6)
MCH: 29 pg (ref 26.0–34.0)
MCHC: 32.9 g/dL (ref 32.0–36.0)
MCV: 88.2 fL (ref 80.0–100.0)
Monocytes Absolute: 0.1 10*3/uL — ABNORMAL LOW (ref 0.2–0.9)
Monocytes Relative: 3 %
Neutro Abs: 2.7 10*3/uL (ref 1.4–6.5)
Neutrophils Relative %: 70 %
Platelets: 869 10*3/uL — ABNORMAL HIGH (ref 150–440)
RBC: 4.78 MIL/uL (ref 3.80–5.20)
RDW: 31 % — ABNORMAL HIGH (ref 11.5–14.5)
WBC: 3.8 10*3/uL (ref 3.6–11.0)

## 2016-11-20 LAB — PROTIME-INR
INR: 1.79
Prothrombin Time: 21 seconds — ABNORMAL HIGH (ref 11.4–15.2)

## 2016-11-20 NOTE — Telephone Encounter (Signed)
-----   Message from Lequita Asal, MD sent at 11/20/2016 12:12 PM EST ----- Regarding: Please call grand-daughter  Platelet count improving.  Find out how much Coumadin she is on.  We need to adjust the dose.  M   ----- Message ----- From: Interface, Lab In [place] Sent: 11/20/2016  10:46 AM To: Lequita Asal, MD

## 2016-11-20 NOTE — Telephone Encounter (Addendum)
Called patient's granddaughter, [name] to inform her that patient's platelets are improving and to inquire about how she is taking Coumadin.  Asked her to call back and LVM as current dosage most likely will need to be adjusted.

## 2016-11-21 ENCOUNTER — Telehealth: Payer: Self-pay | Admitting: *Deleted

## 2016-11-21 NOTE — Telephone Encounter (Signed)
Called patient's granddaughter, [name] again today and LVM to call us back regarding the patient's Coumadin dosage.  Asked her to call us back as the dosage may need to be adjusted.

## 2016-11-22 ENCOUNTER — Observation Stay
Admission: EM | Admit: 2016-11-22 | Discharge: 2016-12-01 | Disposition: A | Discharge status: Skilled Nursing Facility | Payer: Medicare HMO | Attending: Internal Medicine | Admitting: Internal Medicine

## 2016-11-22 ENCOUNTER — Encounter: Payer: Self-pay | Admitting: Emergency Medicine

## 2016-11-22 ENCOUNTER — Other Ambulatory Visit: Payer: Self-pay

## 2016-11-22 ENCOUNTER — Emergency Department: Payer: Medicare HMO

## 2016-11-22 DIAGNOSIS — D473 Essential (hemorrhagic) thrombocythemia: Secondary | ICD-10-CM | POA: Diagnosis not present

## 2016-11-22 DIAGNOSIS — N390 Urinary tract infection, site not specified: Secondary | ICD-10-CM | POA: Insufficient documentation

## 2016-11-22 DIAGNOSIS — Z7901 Long term (current) use of anticoagulants: Secondary | ICD-10-CM | POA: Diagnosis not present

## 2016-11-22 DIAGNOSIS — J111 Influenza due to unidentified influenza virus with other respiratory manifestations: Secondary | ICD-10-CM | POA: Diagnosis not present

## 2016-11-22 DIAGNOSIS — R0602 Shortness of breath: Secondary | ICD-10-CM

## 2016-11-22 DIAGNOSIS — Z933 Colostomy status: Secondary | ICD-10-CM | POA: Diagnosis not present

## 2016-11-22 DIAGNOSIS — I10 Essential (primary) hypertension: Secondary | ICD-10-CM | POA: Insufficient documentation

## 2016-11-22 DIAGNOSIS — Z86718 Personal history of other venous thrombosis and embolism: Secondary | ICD-10-CM | POA: Diagnosis not present

## 2016-11-22 DIAGNOSIS — Z96643 Presence of artificial hip joint, bilateral: Secondary | ICD-10-CM | POA: Diagnosis not present

## 2016-11-22 DIAGNOSIS — Z8673 Personal history of transient ischemic attack (TIA), and cerebral infarction without residual deficits: Secondary | ICD-10-CM | POA: Diagnosis not present

## 2016-11-22 DIAGNOSIS — M6281 Muscle weakness (generalized): Secondary | ICD-10-CM

## 2016-11-22 DIAGNOSIS — B9689 Other specified bacterial agents as the cause of diseases classified elsewhere: Secondary | ICD-10-CM | POA: Diagnosis not present

## 2016-11-22 DIAGNOSIS — Z7982 Long term (current) use of aspirin: Secondary | ICD-10-CM | POA: Insufficient documentation

## 2016-11-22 DIAGNOSIS — Z7951 Long term (current) use of inhaled steroids: Secondary | ICD-10-CM | POA: Insufficient documentation

## 2016-11-22 DIAGNOSIS — R0603 Acute respiratory distress: Secondary | ICD-10-CM | POA: Diagnosis present

## 2016-11-22 MED ORDER — IPRATROPIUM-ALBUTEROL 0.5-2.5 (3) MG/3ML IN SOLN
RESPIRATORY_TRACT | Status: AC
  Filled 2016-11-22: qty 3

## 2016-11-22 MED ORDER — IPRATROPIUM-ALBUTEROL 0.5-2.5 (3) MG/3ML IN SOLN
3.0000 mL | Freq: Once | RESPIRATORY_TRACT | Status: AC
  Administered 2016-11-22: 3 mL via RESPIRATORY_TRACT

## 2016-11-22 NOTE — ED Provider Notes (Signed)
[facility] Emergency Department Provider Note   First MD Initiated Contact with Patient 11/22/16 2325     (approximate)  I have reviewed the triage vital signs and the nursing notes.   HISTORY  Chief Complaint Shortness of Breath     HPI Yolanda Wells is a 83 y.o. female presents via EMS with acute onset of shortness of breath tonight . Per EMS on their arrival patient's oxygen saturation was 86%. As such EMS administered a albuterol nebulized treatment with improvement of saturation at 99% on arrival to the emergency department. It's a generalized muscle aches.   Past Medical History:  Diagnosis Date  . Arthritis   . Collagen vascular disease ([place])   . Deep venous thrombosis (HCC)    right lower extremity  . Dependent edema   . History of bilateral hip replacements   . History of hysterectomy   . Hypertension   . Polycythemia vera ([place])   . Stroke [facility])    Pt reports she was told she had a stroke in may    Patient Active Problem List   Diagnosis Date Noted  . Respiratory distress 11/23/2016  . Vision loss of right eye 08/26/2016  . Blindness of right eye 08/26/2016  . Cannot walk 10/12/2015  . Presence of colostomy ([place]) 10/04/2015  . Long term current use of anticoagulant 10/03/2015  . Pulmonary embolism ([place]) 10/03/2015  . Leukocytosis 09/25/2015  . Chest pain 09/25/2015  . COPD exacerbation ([place]) 09/24/2015  . Right lower lobe pneumonia ([place]) 09/24/2015  . Chronic obstructive pulmonary disease with acute exacerbation ([place]) 09/24/2015  . Dyspnea 09/22/2015  . Elevated troponin 09/15/2015  . UTI (lower urinary tract infection) 07/16/2015  . Abdominal wall cellulitis 07/15/2015  . DVT (deep venous thrombosis) ([place]) 07/15/2015  . HTN (hypertension) 07/15/2015  . Polycythemia vera ([place]) 07/15/2015  . Dependent edema 07/15/2015  . Arthritis 07/15/2015  . Pulmonary emboli ([place]) 05/25/2015  . Diverticulosis of colon with hemorrhage  04/02/2015  . Diverticula of intestine 04/02/2015  . Dehydration, moderate 01/11/2015  . Fall 01/10/2015  . BP (high blood pressure) 01/10/2015  . Hammer toe 09/02/2012  . History of amputation of foot ([place]) 09/02/2012  . Fungal infection of nail 09/02/2012  . Disease of skin and subcutaneous tissue 09/02/2012  . Pain in toe 09/02/2012  . Arthralgia of hip 08/23/2012  . History of repair of hip joint 08/23/2012  . Glenohumeral arthritis 06/28/2012    Past Surgical History:  Procedure Laterality Date  . ABDOMINAL HYSTERECTOMY    . CARDIAC CATHETERIZATION Right 04/03/2015   Procedure: CENTRAL LINE INSERTION;  Surgeon: Sherri Rad, MD;  Location: [facility] ORS;  Service: General;  Laterality: Right;  . COLECTOMY WITH COLOSTOMY CREATION/HARTMANN PROCEDURE N/A 04/03/2015   Procedure: COLECTOMY WITH COLOSTOMY CREATION/HARTMANN PROCEDURE;  Surgeon: Sherri Rad, MD;  Location: [facility] ORS;  Service: General;  Laterality: N/A;  . JOINT REPLACEMENT     Left and Right Hip  . PERIPHERAL VASCULAR CATHETERIZATION N/A 04/02/2015   Procedure: Visceral Angiography;  Surgeon: Algernon Huxley, MD;  Location: [place] [facility];  Service: Cardiovascular;  Laterality: N/A;  . PERIPHERAL VASCULAR CATHETERIZATION N/A 04/02/2015   Procedure: Visceral Artery Intervention;  Surgeon: Algernon Huxley, MD;  Location: [place] [facility];  Service: Cardiovascular;  Laterality: N/A;  . PERIPHERAL VASCULAR CATHETERIZATION N/A 05/25/2015   Procedure: IVC Filter Insertion;  Surgeon: Katha Cabal, MD;  Location: [place] [facility];  Service: Cardiovascular;  Laterality: N/A;  .  TOE AMPUTATION     right    Prior to Admission medications   Medication Sig Start Date End Date Taking? Authorizing Provider  aspirin EC 81 MG tablet Take 1 tablet (81 mg total) by mouth daily. 09/16/15   [name], MD  docusate sodium (COLACE) 100 MG capsule Take 100 mg by mouth 2 (two) times daily as needed for mild constipation.     Historical  Provider, MD  folic acid (FOLVITE) A999333 MCG tablet Take 400 mcg by mouth daily.    Historical Provider, MD  hydroxyurea (HYDREA) 500 MG capsule Pt takes 4 capsules on Wed, Saturday, and Sunday,take 3 capsules on Monday, Tues Thurs, and Friday. 09/16/16   Lequita Asal, MD  ipratropium-albuterol (DUONEB) 0.5-2.5 (3) MG/3ML SOLN Take 3 mLs by nebulization every 4 (four) hours. 09/24/15   Theodoro Grist, MD  menthol-cetylpyridinium (CEPACOL) 3 MG lozenge Take 1 lozenge (3 mg total) by mouth as needed for sore throat. 09/25/15   Theodoro Grist, MD  Polyethylene Glycol POWD Take 17 g by mouth daily. 11/22/15   Clayburn Pert, MD  potassium chloride SA (K-DUR,KLOR-CON) 20 MEQ tablet Take 1 tablet (20 mEq total) by mouth 2 (two) times daily. 07/20/15   Vaughan Basta, MD  warfarin (COUMADIN) 4 MG tablet Take 4 mg by mouth daily.    Historical Provider, MD  warfarin (COUMADIN) 5 MG tablet Take 5 mg by mouth daily.    Historical Provider, MD    Allergies Patient has no known allergies.  Family History  Problem Relation Age of Onset  . Heart attack Father   . COPD Father   . Hypertension    . Arthritis-Osteo    . Diabetes Brother   . Osteosarcoma Son     Social History Social History  Substance Use Topics  . Smoking status: Never Smoker  . Smokeless tobacco: Never Used  . Alcohol use No    Review of Systems Constitutional: No fever/chills Eyes: No visual changes. ENT: No sore throat. Cardiovascular: Denies chest pain. Respiratory: Denies shortness of breath. Gastrointestinal: No abdominal pain.  No nausea, no vomiting.  No diarrhea.  No constipation. Genitourinary: Negative for dysuria. Musculoskeletal: Negative for back pain. Skin: Negative for rash. Neurological: Negative for headaches, focal weakness or numbness.  10-point ROS otherwise negative.  ____________________________________________   PHYSICAL EXAM:  VITAL SIGNS: ED Triage Vitals  Enc Vitals Group     BP  11/22/16 2335 (!) 159/78     Pulse Rate 11/22/16 2335 98     Resp 11/22/16 2335 16     Temp --      Temp src --      SpO2 11/22/16 2333 99 %     Weight --      Height --      Head Circumference --      Peak Flow --      Pain Score --      Pain Loc --      Pain Edu? --      Excl. in [place]? --    Constitutional: Alert and oriented. Well appearing and in no acute distress. Eyes: Conjunctivae are normal. PERRL. EOMI. Head: Atraumatic. Mouth/Throat: Mucous membranes are moist.  Oropharynx non-erythematous. Neck: No stridor.   Cardiovascular: Normal rate, regular rhythm. Good peripheral circulation. Grossly normal heart sounds. Respiratory: Normal respiratory effort.  No retractions.Bibasilar wheezes  Gastrointestinal: Soft and nontender. No distention.  Musculoskeletal: No lower extremity tenderness nor edema. No gross deformities of extremities. Neurologic:  Normal  speech and language. No gross focal neurologic deficits are appreciated.  Skin:  Skin is warm, dry and intact. No rash noted.   ____________________________________________   LABS (all labs ordered are listed, but only abnormal results are displayed)  Labs Reviewed  BASIC METABOLIC PANEL - Abnormal; Notable for the following:       Result Value   Glucose, Bld 121 (*)    Calcium 8.4 (*)    All other components within normal limits  CBC - Abnormal; Notable for the following:    RDW 28.3 (*)    Platelets 1,032 (*)    All other components within normal limits  INFLUENZA PANEL BY PCR (TYPE A & B, H1N1) - Abnormal; Notable for the following:    Influenza A By PCR POSITIVE (*)    All other components within normal limits  TROPONIN I  FIBRIN DERIVATIVES D-DIMER ([facility] ONLY)  TROPONIN I   ____________________________________________  EKG  ED ECG REPORT I, [place] Shanan Fitzpatrick, the attending physician, personally viewed and interpreted this ECG.   Date: 11/23/2016  EKG Time: 11:43 PM  Rate: 101  Rhythm: Sinus  tachycardia  Axis: Normal  Intervals: Normal  ST&T Change: None  ____________________________________________  RADIOLOGY I, Bradley N Macedonio Scallon, personally viewed and evaluated these images (plain radiographs) as part of my medical decision making, as well as reviewing the written report by the radiologist.  Ct Angio Chest Pe W And/or Wo Contrast  Result Date: 11/23/2016 CLINICAL DATA:  Dyspnea, cough, wheezing, shortness of breath. EXAM: CT ANGIOGRAPHY CHEST WITH CONTRAST TECHNIQUE: Multidetector CT imaging of the chest was performed using the standard protocol during bolus administration of intravenous contrast. Multiplanar CT image reconstructions and MIPs were obtained to evaluate the vascular anatomy. CONTRAST:  75 mL Isovue 370 COMPARISON:  09/24/2015 FINDINGS: Cardiovascular: Satisfactory opacification of the pulmonary arteries to the segmental level. No evidence of pulmonary embolism. Normal heart size. No pericardial effusion. Normal caliber thoracic aorta. No aortic dissection. Mediastinum/Nodes: Esophagus is decompressed. No significant lymphadenopathy in the chest. Bilateral thyroid nodules with calcification, unchanged since previous study. Lungs/Pleura: Motion artifact limits evaluation. There is focal consolidation or atelectasis in the right lung base, similar to the previous study. This would suggest a chronic or recurrent process. Atelectasis or scarring in the left lung base. No airspace disease. No pleural effusions. No pneumothorax. Airways are patent. Upper Abdomen: Cholelithiasis without evidence of inflammatory change in the gallbladder. Inferior vena caval filter. Multiple right renal cysts. Small accessory spleen. Musculoskeletal: Degenerative change and scoliosis of the thoracic spine with convexity towards the right. No destructive bone lesions. Review of the MIP images confirms the above findings. IMPRESSION: No evidence of significant pulmonary embolus. Atelectasis or  consolidation in the right lung base is unchanged since prior study suggesting chronic or recurrent process. Cholelithiasis. Electronically Signed   By: Lucienne Capers M.D.   On: 11/23/2016 02:45   Dg Chest Port 1 View  Result Date: 11/23/2016 CLINICAL DATA:  Acute onset of shortness of breath and wheezing. Initial encounter. EXAM: PORTABLE CHEST 1 VIEW COMPARISON:  Chest radiograph performed 09/22/2015, and CTA of the chest performed 09/24/2015 FINDINGS: The lungs are well-aerated. Right hilar prominence is stable in appearance and appears to reflect the patient's vasculature. Mild bibasilar atelectasis is seen. There is no evidence of pleural effusion or pneumothorax. The cardiomediastinal silhouette is borderline enlarged. No acute osseous abnormalities are seen. IMPRESSION: Mild bibasilar atelectasis noted.  Borderline cardiomegaly. Electronically Signed   By: Francoise Schaumann.D.  On: 11/23/2016 00:38    ________ Procedures    INITIAL IMPRESSION / ASSESSMENT AND PLAN / ED COURSE  Pertinent labs & imaging results that were available during my care of the patient were reviewed by me and considered in my medical decision making (see chart for details).  Patient received 2 DuoNeb's arrival to the emergency department improvement of respiratory effort and wheezing. Given initial oxygen saturation of 86% by EMS and work of breathing   Clinical Course     ____________________________________________  FINAL CLINICAL IMPRESSION(S) / ED DIAGNOSES  Final diagnoses:  Influenza     MEDICATIONS GIVEN DURING THIS VISIT:  Medications  ipratropium-albuterol (DUONEB) 0.5-2.5 (3) MG/3ML nebulizer solution 3 mL (3 mLs Nebulization Given 11/22/16 2341)  iopamidol (ISOVUE-370) 76 % injection 75 mL (75 mLs Intravenous Contrast Given 11/23/16 0212)  oseltamivir (TAMIFLU) capsule 75 mg (75 mg Oral Given 11/23/16 0333)     NEW OUTPATIENT MEDICATIONS STARTED DURING THIS VISIT:  New Prescriptions     No medications on file    Modified Medications   No medications on file    Discontinued Medications   CEFUROXIME (CEFTIN) 500 MG TABLET    Take 1 tablet (500 mg total) by mouth 2 (two) times daily with a meal.   WARFARIN (COUMADIN) 5 MG TABLET    TAKE 1 TABLET (5MG ) BY MOUTH SAT- THURS AND TAKE 6MG  EVERY FRIDAY.     Note:  This document was prepared using Dragon voice recognition software and may include unintentional dictation errors.    Gregor Hams, MD 11/23/16 [phone]

## 2016-11-22 NOTE — ED Triage Notes (Signed)
Patient comes from [facility] with reported SOB by EMS.  EMS states she was wheezy in both upper lobes and after administration of albuterol neb she sounded better and was satting 99 on RA.  Pt has hx of hypertension but is WNL at this time.

## 2016-11-23 ENCOUNTER — Encounter: Payer: Self-pay | Admitting: Emergency Medicine

## 2016-11-23 ENCOUNTER — Other Ambulatory Visit: Payer: Self-pay | Admitting: Hematology and Oncology

## 2016-11-23 ENCOUNTER — Emergency Department: Payer: Medicare HMO

## 2016-11-23 DIAGNOSIS — R0603 Acute respiratory distress: Secondary | ICD-10-CM | POA: Diagnosis present

## 2016-11-23 LAB — BASIC METABOLIC PANEL
ANION GAP: 6 (ref 5–15)
BUN: 12 mg/dL (ref 6–20)
CHLORIDE: 106 mmol/L (ref 101–111)
CO2: 24 mmol/L (ref 22–32)
Calcium: 8.4 mg/dL — ABNORMAL LOW (ref 8.9–10.3)
Creatinine, Ser: 0.7 mg/dL (ref 0.44–1.00)
Glucose, Bld: 121 mg/dL — ABNORMAL HIGH (ref 65–99)
POTASSIUM: 5.1 mmol/L (ref 3.5–5.1)
SODIUM: 136 mmol/L (ref 135–145)

## 2016-11-23 LAB — CBC
HEMATOCRIT: 41.3 % (ref 35.0–47.0)
HEMOGLOBIN: 13.6 g/dL (ref 12.0–16.0)
MCH: 29 pg (ref 26.0–34.0)
MCHC: 32.9 g/dL (ref 32.0–36.0)
MCV: 88.2 fL (ref 80.0–100.0)
Platelets: 1032 10*3/uL (ref 150–440)
RBC: 4.69 MIL/uL (ref 3.80–5.20)
RDW: 28.3 % — ABNORMAL HIGH (ref 11.5–14.5)
WBC: 4.9 10*3/uL (ref 3.6–11.0)

## 2016-11-23 LAB — TROPONIN I: Troponin I: <0.03 ng/mL (ref <0.03)

## 2016-11-23 LAB — FIBRIN DERIVATIVES D-DIMER (ARMC ONLY): FIBRIN DERIVATIVES D-DIMER (ARMC): 435 (ref 0–499)

## 2016-11-23 LAB — PROTIME-INR
INR: 2.99
Prothrombin Time: 31.7 seconds — ABNORMAL HIGH (ref 11.4–15.2)

## 2016-11-23 LAB — MRSA PCR SCREENING: MRSA by PCR: NEGATIVE

## 2016-11-23 LAB — INFLUENZA PANEL BY PCR (TYPE A & B)
INFLAPCR: POSITIVE — AB
INFLBPCR: NEGATIVE

## 2016-11-23 LAB — TSH: TSH: 1.481 u[IU]/mL (ref 0.350–4.500)

## 2016-11-23 MED ORDER — IPRATROPIUM-ALBUTEROL 0.5-2.5 (3) MG/3ML IN SOLN
3.0000 mL | RESPIRATORY_TRACT | Status: DC
  Administered 2016-11-23: 3 mL via RESPIRATORY_TRACT
  Filled 2016-11-23: qty 3

## 2016-11-23 MED ORDER — POLYETHYLENE GLYCOL 3350 17 G PO PACK
17.0000 g | PACK | Freq: Every day | ORAL | Status: DC
  Administered 2016-11-23 – 2016-12-01 (×8): 17 g via ORAL
  Filled 2016-11-23 (×8): qty 1

## 2016-11-23 MED ORDER — INFLUENZA VAC SPLIT QUAD 0.5 ML IM SUSY: 0.5000 mL | PREFILLED_SYRINGE | INTRAMUSCULAR | Status: DC

## 2016-11-23 MED ORDER — OSELTAMIVIR PHOSPHATE 75 MG PO CAPS: 75.0000 mg | ORAL_CAPSULE | Freq: Once | ORAL | Status: DC

## 2016-11-23 MED ORDER — IPRATROPIUM-ALBUTEROL 0.5-2.5 (3) MG/3ML IN SOLN
3.0000 mL | Freq: Four times a day (QID) | RESPIRATORY_TRACT | Status: DC
  Administered 2016-11-23 – 2016-11-27 (×17): 3 mL via RESPIRATORY_TRACT
  Filled 2016-11-23 (×17): qty 3

## 2016-11-23 MED ORDER — WARFARIN SODIUM 4 MG PO TABS: 5.0000 mg | ORAL_TABLET | Freq: Every day | ORAL | Status: DC

## 2016-11-23 MED ORDER — FOLIC ACID 1 MG PO TABS
500.0000 ug | ORAL_TABLET | Freq: Every day | ORAL | Status: DC
  Administered 2016-11-23 – 2016-12-01 (×9): 0.5 mg via ORAL
  Filled 2016-11-23 (×9): qty 1

## 2016-11-23 MED ORDER — SODIUM CHLORIDE 0.9% FLUSH
3.0000 mL | Freq: Two times a day (BID) | INTRAVENOUS | Status: DC
  Administered 2016-11-24 – 2016-12-01 (×14): 3 mL via INTRAVENOUS

## 2016-11-23 MED ORDER — ACETAMINOPHEN 650 MG RE SUPP
650.0000 mg | Freq: Four times a day (QID) | RECTAL | Status: DC | PRN
Start: 2016-11-23 — End: 2016-12-01

## 2016-11-23 MED ORDER — ONDANSETRON HCL 4 MG/2ML IJ SOLN: 4.0000 mg | Freq: Four times a day (QID) | INTRAMUSCULAR | Status: DC | PRN

## 2016-11-23 MED ORDER — OSELTAMIVIR PHOSPHATE 75 MG PO CAPS
75.0000 mg | ORAL_CAPSULE | Freq: Two times a day (BID) | ORAL | Status: AC
  Administered 2016-11-23 – 2016-11-27 (×10): 75 mg via ORAL
  Filled 2016-11-23 (×11): qty 1

## 2016-11-23 MED ORDER — ONDANSETRON HCL 4 MG PO TABS: 4.0000 mg | ORAL_TABLET | Freq: Four times a day (QID) | ORAL | Status: DC | PRN

## 2016-11-23 MED ORDER — POTASSIUM CHLORIDE CRYS ER 20 MEQ PO TBCR
20.0000 meq | EXTENDED_RELEASE_TABLET | Freq: Two times a day (BID) | ORAL | Status: DC
  Administered 2016-11-23 – 2016-12-01 (×17): 20 meq via ORAL
  Filled 2016-11-23 (×17): qty 1

## 2016-11-23 MED ORDER — WARFARIN - PHARMACIST DOSING INPATIENT
Freq: Every day | Status: DC
  Administered 2016-11-23 – 2016-11-30 (×7)

## 2016-11-23 MED ORDER — HYDROXYUREA 500 MG PO CAPS
2000.0000 mg | ORAL_CAPSULE | ORAL | Status: DC
  Administered 2016-11-23 – 2016-11-30 (×4): 2000 mg via ORAL
  Filled 2016-11-23 (×4): qty 4

## 2016-11-23 MED ORDER — MUPIROCIN 2 % EX OINT
1.0000 "application " | TOPICAL_OINTMENT | Freq: Two times a day (BID) | CUTANEOUS | Status: DC
  Filled 2016-11-23: qty 22

## 2016-11-23 MED ORDER — CHLORHEXIDINE GLUCONATE CLOTH 2 % EX PADS: 6.0000 | MEDICATED_PAD | Freq: Every day | CUTANEOUS | Status: DC

## 2016-11-23 MED ORDER — CEFTRIAXONE SODIUM-DEXTROSE 1-3.74 GM-% IV SOLR
1.0000 g | INTRAVENOUS | Status: DC
  Administered 2016-11-23 – 2016-11-27 (×5): 1 g via INTRAVENOUS
  Filled 2016-11-23 (×5): qty 50

## 2016-11-23 MED ORDER — IOPAMIDOL (ISOVUE-370) INJECTION 76%
75.0000 mL | Freq: Once | INTRAVENOUS | Status: AC | PRN
  Administered 2016-11-23: 75 mL via INTRAVENOUS

## 2016-11-23 MED ORDER — WARFARIN SODIUM 4 MG PO TABS
4.0000 mg | ORAL_TABLET | Freq: Once | ORAL | Status: AC
  Administered 2016-11-23: 4 mg via ORAL
  Filled 2016-11-23: qty 1

## 2016-11-23 MED ORDER — WARFARIN SODIUM 4 MG PO TABS: 4.0000 mg | ORAL_TABLET | Freq: Every day | ORAL | Status: DC

## 2016-11-23 MED ORDER — HYDROXYUREA 500 MG PO CAPS
1500.0000 mg | ORAL_CAPSULE | ORAL | Status: DC
  Administered 2016-11-24 – 2016-12-01 (×5): 1500 mg via ORAL
  Filled 2016-11-23 (×5): qty 3

## 2016-11-23 MED ORDER — DEXTROSE 5 % IV SOLN: 1.0000 g | INTRAVENOUS | Status: DC

## 2016-11-23 MED ORDER — DOCUSATE SODIUM 100 MG PO CAPS
100.0000 mg | ORAL_CAPSULE | Freq: Two times a day (BID) | ORAL | Status: DC | PRN
  Administered 2016-11-26: 100 mg via ORAL

## 2016-11-23 MED ORDER — MENTHOL 3 MG MT LOZG
1.0000 | LOZENGE | OROMUCOSAL | Status: DC | PRN
  Filled 2016-11-23: qty 9

## 2016-11-23 MED ORDER — OSELTAMIVIR PHOSPHATE 75 MG PO CAPS
75.0000 mg | ORAL_CAPSULE | Freq: Once | ORAL | Status: AC
  Administered 2016-11-23: 75 mg via ORAL
  Filled 2016-11-23: qty 1

## 2016-11-23 MED ORDER — ACETAMINOPHEN 325 MG PO TABS
650.0000 mg | ORAL_TABLET | Freq: Four times a day (QID) | ORAL | Status: DC | PRN
  Administered 2016-11-23: 07:00:00 650 mg via ORAL
  Filled 2016-11-23: qty 2

## 2016-11-23 MED ORDER — SODIUM CHLORIDE 0.9 % IV SOLN
INTRAVENOUS | Status: DC
  Administered 2016-11-23 (×2): via INTRAVENOUS

## 2016-11-23 MED ORDER — ASPIRIN EC 81 MG PO TBEC
81.0000 mg | DELAYED_RELEASE_TABLET | Freq: Every day | ORAL | Status: DC
  Administered 2016-11-23 – 2016-12-01 (×9): 81 mg via ORAL
  Filled 2016-11-23 (×9): qty 1

## 2016-11-23 NOTE — Progress Notes (Signed)
Admitted this morning because of shortness of breath, have influenza type A. Patient says that she feels a little better. Labs and vitals reviwed.  #1 influenza type illness: Admitted, started on Tamiflu #2/ UTI: On Rocephin, follow urine cultures #3. history of DVT continue warfarin #4 .history of polycythemia, thrombocytosis, platelets up to 1000 today. Restart the hydroxyurea. Time spent in reviewing the charts, medications, examining the patient took 25 minutes.

## 2016-11-23 NOTE — H&P (Signed)
Yolanda Wells is an 83 y.o. female.   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 The patient states that her granddaughter and primary caretaker has been ill with the flu. She has developed a cough over the last 2 days that is occasionally productive of thick white sputum. She admits to subjective fevers as well as occasional nausea but denies vomiting. A evaluation in the emergency department revealed urinary tract infection as well as positive influenza. Due to overall debility as well as intermittent tachypnea the emergency department staff called the hospitalist service for admission.  Past Medical History:  Diagnosis Date  . Arthritis   . Collagen vascular disease (HCC)   . Deep venous thrombosis (HCC)    right lower extremity  . Dependent edema   . History of bilateral hip replacements   . History of hysterectomy   . Hypertension   . Polycythemia vera (HCC)   . Stroke [facility])    Pt reports she was told she had a stroke in may    Past Surgical History:  Procedure Laterality Date  . ABDOMINAL HYSTERECTOMY    . CARDIAC CATHETERIZATION Right 04/03/2015   Procedure: CENTRAL LINE INSERTION;  Surgeon: Natale Lay, MD;  Location: [facility] ORS;  Service: General;  Laterality: Right;  . COLECTOMY WITH COLOSTOMY CREATION/HARTMANN PROCEDURE N/A 04/03/2015   Procedure: COLECTOMY WITH COLOSTOMY CREATION/HARTMANN PROCEDURE;  Surgeon: Natale Lay, MD;  Location: [facility] ORS;  Service: General;  Laterality: N/A;  . JOINT REPLACEMENT     Left and Right Hip  . PERIPHERAL VASCULAR CATHETERIZATION N/A 04/02/2015   Procedure: Visceral Angiography;  Surgeon: Annice Needy, MD;  Location: [facility] INVASIVE CV LAB;  Service: Cardiovascular;  Laterality: N/A;  . PERIPHERAL VASCULAR CATHETERIZATION N/A 04/02/2015   Procedure: Visceral Artery  Intervention;  Surgeon: Annice Needy, MD;  Location: [facility] INVASIVE CV LAB;  Service: Cardiovascular;  Laterality: N/A;  . PERIPHERAL VASCULAR CATHETERIZATION N/A 05/25/2015   Procedure: IVC Filter Insertion;  Surgeon: Renford Dills, MD;  Location: [facility] INVASIVE CV LAB;  Service: Cardiovascular;  Laterality: N/A;  . TOE AMPUTATION     right    Family History  Problem Relation Age of Onset  . Heart attack Father   . COPD Father   . Hypertension    . Arthritis-Osteo    . Diabetes Brother   . Osteosarcoma Son    Social History:  reports that she has never smoked. She has never used smokeless tobacco. She reports that she does not drink alcohol or use drugs.  Allergies: No Known Allergies  Medications Prior to Admission  Medication Sig Dispense Refill  . aspirin EC 81 MG tablet Take 1 tablet (81 mg total) by mouth daily. 30 tablet 2  . docusate sodium (COLACE) 100 MG capsule Take 100 mg by mouth 2 (two) times daily as needed for mild constipation.     . folic acid (FOLVITE) 400 MCG tablet Take 400 mcg by mouth daily.    . hydroxyurea (HYDREA) 500 MG capsule Pt takes 4 capsules on Wed, Saturday, and Sunday,take 3 capsules on Monday, Tues Thurs, and Friday. 24 capsule 0  . ipratropium-albuterol (DUONEB) 0.5-2.5 (3) MG/3ML SOLN Take 3 mLs by nebulization every 4 (four) hours. 360 mL 6  . menthol-cetylpyridinium (CEPACOL) 3 MG lozenge Take 1 lozenge (3 mg total) by mouth as needed for sore throat. 100  tablet 12  . Polyethylene Glycol POWD Take 17 g by mouth daily. 527 g 5  . potassium chloride SA (K-DUR,KLOR-CON) 20 MEQ tablet Take 1 tablet (20 mEq total) by mouth 2 (two) times daily. 30 tablet 0  . warfarin (COUMADIN) 4 MG tablet Take 4 mg by mouth daily.    [name] warfarin (COUMADIN) 5 MG tablet Take 5 mg by mouth daily.      Results for orders placed or performed during the hospital encounter of 11/22/16 (from the past 48 hour(s))  Basic metabolic panel     Status: Abnormal   Collection  Time: 11/22/16 11:38 PM  Result Value Ref Range   Sodium 136 135 - 145 mmol/L   Potassium 5.1 3.5 - 5.1 mmol/L    Comment: HEMOLYSIS AT THIS LEVEL MAY AFFECT RESULT   Chloride 106 101 - 111 mmol/L   CO2 24 22 - 32 mmol/L   Glucose, Bld 121 (H) 65 - 99 mg/dL   BUN 12 6 - 20 mg/dL   Creatinine, Ser 0.70 0.44 - 1.00 mg/dL   Calcium 8.4 (L) 8.9 - 10.3 mg/dL   GFR calc non Af Amer >60 >60 mL/min   GFR calc Af Amer >60 >60 mL/min    Comment: (NOTE) The eGFR has been calculated using the CKD EPI equation. This calculation has not been validated in all clinical situations. eGFR's persistently <60 mL/min signify possible Chronic Kidney Disease.    Anion gap 6 5 - 15  CBC     Status: Abnormal   Collection Time: 11/22/16 11:38 PM  Result Value Ref Range   WBC 4.9 3.6 - 11.0 K/uL   RBC 4.69 3.80 - 5.20 MIL/uL   Hemoglobin 13.6 12.0 - 16.0 g/dL   HCT 41.3 35.0 - 47.0 %   MCV 88.2 80.0 - 100.0 fL   MCH 29.0 26.0 - 34.0 pg   MCHC 32.9 32.0 - 36.0 g/dL   RDW 28.3 (H) 11.5 - 14.5 %   Platelets 1,032 (HH) 150 - 440 K/uL    Comment: CRITICAL RESULT CALLED TO, READ BACK BY AND VERIFIED WITH: DAVID WALKER AT 0022 ON 11/23/16 RWW   Troponin I     Status: None   Collection Time: 11/22/16 11:38 PM  Result Value Ref Range   Troponin I <0.03 <0.03 ng/mL  Fibrin derivatives D-Dimer     Status: None   Collection Time: 11/22/16 11:38 PM  Result Value Ref Range   Fibrin derivatives D-dimer ([facility]) 435 0 - 499    Comment: <> Exclusion of Venous Thromboembolism (VTE) - OUTPATIENTS ONLY        (Emergency Department or [place])             0-499 ng/ml (FEU)  : With a low to intermediate pretest                                        probability for VTE this test result                                        excludes the diagnosis of VTE.           > 499 ng/ml (FEU)  : VTE not excluded.  Additional work up  for VTE is required.   <>  Testing on Inpatients and Evaluation  of Disseminated Intravascular        Coagulation ([facility])             Reference Range:   0-499 ng/ml (FEU)   Influenza panel by PCR (type A & B, H1N1)     Status: Abnormal   Collection Time: 11/23/16  2:01 AM  Result Value Ref Range   Influenza A By PCR POSITIVE (A) NEGATIVE   Influenza B By PCR NEGATIVE NEGATIVE    Comment: (NOTE) The Xpert Xpress Flu assay is intended as an aid in the diagnosis of  influenza and should not be used as a sole basis for treatment.  This  assay is FDA approved for nasopharyngeal swab specimens only. Nasal  washings and aspirates are unacceptable for Xpert Xpress Flu testing.   Troponin I     Status: None   Collection Time: 11/23/16  3:29 AM  Result Value Ref Range   Troponin I <0.03 <0.03 ng/mL   Ct Angio Chest Pe W And/or Wo Contrast  Result Date: 11/23/2016 CLINICAL DATA:  Dyspnea, cough, wheezing, shortness of breath. EXAM: CT ANGIOGRAPHY CHEST WITH CONTRAST TECHNIQUE: Multidetector CT imaging of the chest was performed using the standard protocol during bolus administration of intravenous contrast. Multiplanar CT image reconstructions and MIPs were obtained to evaluate the vascular anatomy. CONTRAST:  75 mL Isovue 370 COMPARISON:  09/24/2015 FINDINGS: Cardiovascular: Satisfactory opacification of the pulmonary arteries to the segmental level. No evidence of pulmonary embolism. Normal heart size. No pericardial effusion. Normal caliber thoracic aorta. No aortic dissection. Mediastinum/Nodes: Esophagus is decompressed. No significant lymphadenopathy in the chest. Bilateral thyroid nodules with calcification, unchanged since previous study. Lungs/Pleura: Motion artifact limits evaluation. There is focal consolidation or atelectasis in the right lung base, similar to the previous study. This would suggest a chronic or recurrent process. Atelectasis or scarring in the left lung base. No airspace disease. No pleural effusions. No pneumothorax. Airways are patent.  Upper Abdomen: Cholelithiasis without evidence of inflammatory change in the gallbladder. Inferior vena caval filter. Multiple right renal cysts. Small accessory spleen. Musculoskeletal: Degenerative change and scoliosis of the thoracic spine with convexity towards the right. No destructive bone lesions. Review of the MIP images confirms the above findings. IMPRESSION: No evidence of significant pulmonary embolus. Atelectasis or consolidation in the right lung base is unchanged since prior study suggesting chronic or recurrent process. Cholelithiasis. Electronically Signed   By: Lucienne Capers M.D.   On: 11/23/2016 02:45   Dg Chest Port 1 View  Result Date: 11/23/2016 CLINICAL DATA:  Acute onset of shortness of breath and wheezing. Initial encounter. EXAM: PORTABLE CHEST 1 VIEW COMPARISON:  Chest radiograph performed 09/22/2015, and CTA of the chest performed 09/24/2015 FINDINGS: The lungs are well-aerated. Right hilar prominence is stable in appearance and appears to reflect the patient's vasculature. Mild bibasilar atelectasis is seen. There is no evidence of pleural effusion or pneumothorax. The cardiomediastinal silhouette is borderline enlarged. No acute osseous abnormalities are seen. IMPRESSION: Mild bibasilar atelectasis noted.  Borderline cardiomegaly. Electronically Signed   By: Garald Balding M.D.   On: 11/23/2016 00:38    Review of Systems  Constitutional: Positive for malaise/fatigue. Negative for chills and fever.  HENT: Negative for sore throat and tinnitus.   Eyes: Negative for blurred vision and redness.  Respiratory: Positive for cough and shortness of breath.   Cardiovascular: Negative for chest pain, palpitations, orthopnea and PND.  Gastrointestinal: Negative  for abdominal pain, diarrhea, nausea and vomiting.  Genitourinary: Negative for dysuria, frequency and urgency.  Musculoskeletal: Negative for joint pain and myalgias.  Skin: Negative for rash.       No lesions   Neurological: Negative for speech change, focal weakness and weakness.  Endo/Heme/Allergies: Does not bruise/bleed easily.       No temperature intolerance  Psychiatric/Behavioral: Negative for depression and suicidal ideas.    Blood pressure (!) 141/69, pulse 86, temperature 99.1 F (37.3 C), resp. rate 20, height '5\' 6"'$  (1.676 m), weight 92.8 kg (204 lb 9.6 oz), SpO2 98 %. Physical Exam  Vitals reviewed. Constitutional: She is oriented to person, place, and time. She appears well-developed and well-nourished. No distress.  HENT:  Head: Normocephalic and atraumatic.  Mouth/Throat: Oropharynx is clear and moist.  Eyes: Conjunctivae and EOM are normal. Pupils are equal, round, and reactive to light. No scleral icterus.  Neck: Normal range of motion. Neck supple. No tracheal deviation present. No thyromegaly present.  Cardiovascular: Normal rate, regular rhythm and normal heart sounds.  Exam reveals no gallop and no friction rub.   No murmur heard. Respiratory: Effort normal and breath sounds normal.  GI: Soft. Bowel sounds are normal. She exhibits no distension. There is no tenderness.  Colostomy in place  Genitourinary:  Genitourinary Comments: Deferred  Musculoskeletal: Normal range of motion. She exhibits no edema.  Lymphadenopathy:    She has no cervical adenopathy.  Neurological: She is alert and oriented to person, place, and time. No cranial nerve deficit. She exhibits normal muscle tone.  Skin: Skin is warm and dry. No rash noted. No erythema.  Psychiatric: She has a normal mood and affect. Judgment and thought content normal.     Assessment/Plan This is an 83 year old female admitted for intermittent viral sepsis secondary to influenza and urinary tract infection. 1. Influenza: Start Tamiflu. Hydrate with intravenous fluid. Tylenol for symptomatic fever relief. The patient intermittently meets sepsis criteria but she is hemodynamically stable. 2. Urinary tract infection:  Start ceftriaxone. Continue antibiotics. Follow urine culture for growth and sensitivities. 3. History of DVT: Continue warfarin 4. Polycythemia: With thrombocytosis; continue hydroxyurea 5. DVT prophylaxis: Full dose anticoagulation 6. GI prophylaxis: None The patient is a full code. Time spent on admission orders and patient care approximately 45 minutes  Harrie Foreman, MD 11/23/2016, 6:52 AM

## 2016-11-23 NOTE — ED Notes (Signed)
Green top tube collected and sent to the lab for MD ordered REPEAT troponin.

## 2016-11-23 NOTE — Progress Notes (Addendum)
ANTICOAGULATION CONSULT NOTE - Initial Consult  Pharmacy Consult for warfarin  Indication: DVT prophylaxis  No Known Allergies  Patient Measurements: Height: 5\' 6"  (167.6 cm) Weight: 204 lb 9.6 oz (92.8 kg) IBW/kg (Calculated) : 59.3 Heparin Dosing Weight:   Vital Signs: Temp: 99.1 F (37.3 C) (01/14 0639) BP: 141/69 (01/14 0639) Pulse Rate: 86 (01/14 0639)  Labs:  Recent Labs  11/20/16 1029 11/22/16 2338 11/23/16 0329  HGB 13.9 13.6  --   HCT 42.2 41.3  --   PLT 869* 1,032*  --   LABPROT 21.0* 31.7*  --   INR 1.79 2.99  --   CREATININE  --  0.70  --   TROPONINI  --  <0.03 <0.03    Estimated Creatinine Clearance: 62.2 mL/min (by C-G formula based on SCr of 0.7 mg/dL).   Medical History: Past Medical History:  Diagnosis Date  . Arthritis   . Collagen vascular disease ([place])   . Deep venous thrombosis (HCC)    right lower extremity  . Dependent edema   . History of bilateral hip replacements   . History of hysterectomy   . Hypertension   . Polycythemia vera ([place])   . Stroke [facility])    Pt reports she was told she had a stroke in may    Medications:  Prescriptions Prior to Admission  Medication Sig Dispense Refill Last Dose  . aspirin EC 81 MG tablet Take 1 tablet (81 mg total) by mouth daily. 30 tablet 2 Taking  . docusate sodium (COLACE) 100 MG capsule Take 100 mg by mouth 2 (two) times daily as needed for mild constipation.    Taking  . folic acid (FOLVITE) [id number] MCG tablet Take 400 mcg by mouth daily.   Taking  . hydroxyurea (HYDREA) 500 MG capsule Pt takes 4 capsules on Wed, Saturday, and Sunday,take 3 capsules on Monday, Tues Thurs, and Friday. 24 capsule 0 Taking  . ipratropium-albuterol (DUONEB) 0.5-2.5 (3) MG/3ML SOLN Take 3 mLs by nebulization every 4 (four) hours. 360 mL 6 Taking  . menthol-cetylpyridinium (CEPACOL) 3 MG lozenge Take 1 lozenge (3 mg total) by mouth as needed for sore throat. 100 tablet 12 Taking  . Polyethylene Glycol POWD Take 17 g by  mouth daily. 527 g 5 Taking  . potassium chloride SA (K-DUR,KLOR-CON) 20 MEQ tablet Take 1 tablet (20 mEq total) by mouth 2 (two) times daily. 30 tablet 0 Taking  . warfarin (COUMADIN) 4 MG tablet Take 4 mg by mouth daily.   Taking  . warfarin (COUMADIN) 5 MG tablet Take 5 mg by mouth daily.   Taking   Scheduled:  . aspirin EC  81 mg Oral Daily  . cefTRIAXone  1 g Intravenous Q24H  . Chlorhexidine Gluconate Cloth  6 each Topical Q0600  . folic acid  [id number] mcg Oral Daily  . ipratropium-albuterol  3 mL Nebulization Q4H  . mupirocin ointment  1 application Nasal BID  . oseltamivir  75 mg Oral BID  . polyethylene glycol  17 g Oral Daily  . potassium chloride SA  20 mEq Oral BID  . sodium chloride flush  3 mL Intravenous Q12H  . warfarin  4 mg Oral Daily  . warfarin  5 mg Oral Daily   Infusions:  . sodium chloride 125 mL/hr at 11/23/16 [id number]    Assessment: Pharmacy consulted to dose and monitor warfarin therapy in this 83 year old woman. Patient takes warfarin 4 mg 3 x a week and 5 mg every other day.  Goal of Therapy:  INR 2-3 Monitor platelets by anticoagulation protocol: Yes   Plan:  INR is 2.99. Will give warfarin 4 mg PO x 1 . Will recheck PT/INR with am labs.    Axil Copeman D 11/23/2016,9:51 AM

## 2016-11-23 NOTE — ED Notes (Addendum)
RN not notified that bed assignment had been received. Will call report at this time and transfer patient to inpatient unit.

## 2016-11-23 NOTE — ED Notes (Signed)
Transporting patient to room 102-1C

## 2016-11-23 NOTE — ED Notes (Signed)
Patient resting quietly.  Voices no complaints at this time.

## 2016-11-24 ENCOUNTER — Observation Stay: Payer: Medicare HMO

## 2016-11-24 LAB — CBC
HEMATOCRIT: 36.7 % (ref 35.0–47.0)
Hemoglobin: 12.3 g/dL (ref 12.0–16.0)
MCH: 29.6 pg (ref 26.0–34.0)
MCHC: 33.5 g/dL (ref 32.0–36.0)
MCV: 88.5 fL (ref 80.0–100.0)
PLATELETS: 785 10*3/uL — AB (ref 150–440)
RBC: 4.14 MIL/uL (ref 3.80–5.20)
RDW: 28.5 % — AB (ref 11.5–14.5)
WBC: 2.4 10*3/uL — ABNORMAL LOW (ref 3.6–11.0)

## 2016-11-24 LAB — PROTIME-INR
INR: 3.6
Prothrombin Time: 36.8 seconds — ABNORMAL HIGH (ref 11.4–15.2)

## 2016-11-24 NOTE — Evaluation (Signed)
Physical Therapy Evaluation Patient Details Name: Yolanda Wells MRN: [id number] DOB: [birth year] Today's Date: 11/24/2016   History of Present Illness  Pt is an 83 y.o. female presenting to hospital with SOB and hypoxia.  Pt admitted with intermittent viral sepsis secondary to UTI and influenza type A.  PMH includes R toe amp, B THR, R LE DVT, stroke, blindness R eye, colostomy, PE, COPD with exacerbation.  Clinical Impression  Prior to hospital admission, pt reports she was performing bed mobility and transfers to w/c with assist.  Pt lives at [place] and has assist from caregiver and pt's granddaughter.  Currently pt is mod to max assist supine to sit and min to mod assist to stand and take a few steps bed to chair with RW (2nd CGA for safety).  Pt would benefit from skilled PT to address noted impairments and functional limitations.  Recommend pt discharge back to ALF with assist with all functional mobility and HHPT when medically appropriate.    Follow Up Recommendations Home health PT;Supervision/Assistance - 24 hour    Equipment Recommendations  Rolling walker with 5" wheels (pt reports already owning RW)    Recommendations for Other Services       Precautions / Restrictions Precautions Precautions: Fall Restrictions Weight Bearing Restrictions: No      Mobility  Bed Mobility Overal bed mobility: Needs Assistance Bed Mobility: Supine to Sit     Supine to sit: Mod assist;Max assist;HOB elevated     General bed mobility comments: assist for B LE's and trunk; vc's to use side rail  Transfers Overall transfer level: Needs assistance Equipment used: Rolling walker (2 wheeled) Transfers: Sit to/from [facility] Sit to Stand: Mod assist (plus 2nd CGA for safety) Stand pivot transfers: Min assist;Mod assist (2nd CGA for safety)       General transfer comment: vc's for foot placement; 2 trials to stand 1st attempt; after pt sat down (after 1st  attempt standing) pt able to stand again on next attempt  Ambulation/Gait             General Gait Details: pt reports she no longer walks  Stairs            Wheelchair Mobility    Modified Rankin (Stroke Patients Only)       Balance Overall balance assessment: Needs assistance Sitting-balance support: Bilateral upper extremity supported;Feet supported Sitting balance-Leahy Scale: Fair     Standing balance support: Bilateral upper extremity supported (on RW) Standing balance-Leahy Scale: Poor Standing balance comment: Posterior lean initially standing but with vc's to shift weight forward progressed from min assist to CGA                             Pertinent Vitals/Pain Pain Assessment: No/denies pain    Home Living Family/patient expects to be discharged to:: Assisted living [facility]) Living Arrangements: Alone             Home Equipment: Walker - 4 wheels;Wheelchair - power      Prior Function Level of Independence: Needs assistance   Gait / Transfers Assistance Needed: Pt receives assist for all bed mobility and transfers (bed to chair) with RW; pt reports she no longer walks.  ADL's / Homemaking Assistance Needed: Pt has caregiver that assists pt OOB in the morning and assists with bathing, dressing, meals (breakfast and lunch) and leaves around 2pm.  Pt reports her granddaughter assists pt back  to bed at night and on weekends.        Hand Dominance   Dominant Hand: Right    Extremity/Trunk Assessment   Upper Extremity Assessment Upper Extremity Assessment: Generalized weakness    Lower Extremity Assessment Lower Extremity Assessment: Generalized weakness       Communication   Communication: No difficulties  Cognition Arousal/Alertness: Awake/alert Behavior During Therapy: WFL for tasks assessed/performed Overall Cognitive Status: Within Functional Limits for tasks assessed                       General Comments   Nursing cleared pt for participation in physical therapy.  Pt agreeable to PT session.    Exercises     Assessment/Plan    PT Assessment Patient needs continued PT services  PT Problem List Decreased strength;Decreased activity tolerance;Decreased mobility          PT Treatment Interventions DME instruction;Functional mobility training;Therapeutic activities;Therapeutic exercise;Balance training;Patient/family education    PT Goals (Current goals can be found in the Care Plan section)  Acute Rehab PT Goals Patient Stated Goal: to go home PT Goal Formulation: With patient Time For Goal Achievement: 12/08/16 Potential to Achieve Goals: Good    Frequency Min 2X/week   Barriers to discharge        Co-evaluation               End of Session Equipment Utilized During Treatment: Gait belt Activity Tolerance: Patient limited by fatigue Patient left: in chair;with call bell/phone within reach (chair pad alarm under pt; compatible chair alarm not in room so unable to activate (nursing notified; charge nurse also notified who reported she would call for chair alarm to be delivered to pt's room); pt educated not to get up without staff assist) Nurse Communication: Mobility status;Precautions    Functional Assessment Tool Used: clinical judgement Functional Limitation: Mobility: Walking and moving around Mobility: Walking and Moving Around Current Status [id number]): At least 60 percent but less than 80 percent impaired, limited or restricted Mobility: Walking and Moving Around Goal Status [phone]): At least 20 percent but less than 40 percent impaired, limited or restricted    Time: 1340-1408 PT Time Calculation (min) (ACUTE ONLY): 28 min   Charges:   PT Evaluation $PT Eval Low Complexity: 1 Procedure     PT G Codes:   PT G-Codes **NOT FOR INPATIENT CLASS** Functional Assessment Tool Used: clinical judgement Functional Limitation: Mobility: Walking and  moving around Mobility: Walking and Moving Around Current Status [id number]): At least 60 percent but less than 80 percent impaired, limited or restricted Mobility: Walking and Moving Around Goal Status [phone]): At least 20 percent but less than 40 percent impaired, limited or restricted    Leitha Bleak 11/24/2016, 2:34 PM Leitha Bleak, [place]

## 2016-11-24 NOTE — Progress Notes (Signed)
[facility] NAME: Yolanda Wells    MR#:  [id number]  DATE OF BIRTH:  [birth year]  SUBJECTIVE: Admitted because of shortness of breath, found to have influenza type A, UTI.   CHIEF COMPLAINT:   Chief Complaint  Patient presents with  . Shortness of Breath    REVIEW OF SYSTEMS:   ROS CONSTITUTIONAL:  Feels better today. EYES: No blurred or double vision.  EARS, NOSE, AND THROAT: No tinnitus or ear pain.  RESPIRATORY: No cough, shortness of breath, wheezing or hemoptysis.  CARDIOVASCULAR: No chest pain, orthopnea, edema.  GASTROINTESTINAL: No nausea, vomiting, diarrhea or abdominal pain. Colostomy bag  In middle of stomach GENITOURINARY: No dysuria, hematuria.  ENDOCRINE: No polyuria, nocturia,  HEMATOLOGY: No anemia, easy bruising or bleeding SKIN: No rash or lesion. MUSCULOSKELETAL: No joint pain or arthritis.   NEUROLOGIC: No tingling, numbness, weakness.  PSYCHIATRY: No anxiety or depression.   DRUG ALLERGIES:  No Known Allergies  VITALS:  Blood pressure 125/63, pulse 79, temperature 98.2 F (36.8 C), temperature source Oral, resp. rate 20, height 5\' 6"  (1.676 m), weight 95.8 kg (211 lb 1.6 oz), SpO2 100 %.  PHYSICAL EXAMINATION:  GENERAL:  83 y.o.-year-old patient lying in the bed with no acute distress.  EYES: Pupils equal, round, reactive to light and accommodation. No scleral icterus. Extraocular muscles intact.  HEENT: Head atraumatic, normocephalic. Oropharynx and nasopharynx clear.  NECK:  Supple, no jugular venous distention. No thyroid enlargement, no tenderness.  LUNGS: Normal breath sounds bilaterally, no wheezing, rales,rhonchi or crepitation. No use of accessory muscles of respiration.  CARDIOVASCULAR: S1, S2 normal. No murmurs, rubs, or gallops.  ABDOMEN: Soft, nontender, nondistended. Bowel sounds present. No organomegaly or mass. Colostomy bag in middle of stomach,  EXTREMITIES: No pedal edema,  cyanosis, or clubbing.  NEUROLOGIC: Cranial nerves II through XII are intact. Muscle strength 5/5 in all extremities. Sensation intact. Gait not checked.  PSYCHIATRIC: The patient is alert and oriented x 3.  SKIN: No obvious rash, lesion, or ulcer.    LABORATORY PANEL:   CBC  Recent Labs Lab 11/24/16 0545  WBC 2.4*  HGB 12.3  HCT 36.7  PLT 785*   ------------------------------------------------------------------------------------------------------------------  Chemistries   Recent Labs Lab 11/22/16 2338  NA 136  K 5.1  CL 106  CO2 24  GLUCOSE 121*  BUN 12  CREATININE 0.70  CALCIUM 8.4*   ------------------------------------------------------------------------------------------------------------------  Cardiac Enzymes  Recent Labs Lab 11/23/16 0329  TROPONINI <0.03   ------------------------------------------------------------------------------------------------------------------  RADIOLOGY:  Ct Angio Chest Pe W And/or Wo Contrast  Result Date: 11/23/2016 CLINICAL DATA:  Dyspnea, cough, wheezing, shortness of breath. EXAM: CT ANGIOGRAPHY CHEST WITH CONTRAST TECHNIQUE: Multidetector CT imaging of the chest was performed using the standard protocol during bolus administration of intravenous contrast. Multiplanar CT image reconstructions and MIPs were obtained to evaluate the vascular anatomy. CONTRAST:  75 mL Isovue 370 COMPARISON:  09/24/2015 FINDINGS: Cardiovascular: Satisfactory opacification of the pulmonary arteries to the segmental level. No evidence of pulmonary embolism. Normal heart size. No pericardial effusion. Normal caliber thoracic aorta. No aortic dissection. Mediastinum/Nodes: Esophagus is decompressed. No significant lymphadenopathy in the chest. Bilateral thyroid nodules with calcification, unchanged since previous study. Lungs/Pleura: Motion artifact limits evaluation. There is focal consolidation or atelectasis in the right lung base, similar to the  previous study. This would suggest a chronic or recurrent process. Atelectasis or scarring in the left lung base. No airspace disease. No pleural effusions. No pneumothorax. Airways  are patent. Upper Abdomen: Cholelithiasis without evidence of inflammatory change in the gallbladder. Inferior vena caval filter. Multiple right renal cysts. Small accessory spleen. Musculoskeletal: Degenerative change and scoliosis of the thoracic spine with convexity towards the right. No destructive bone lesions. Review of the MIP images confirms the above findings. IMPRESSION: No evidence of significant pulmonary embolus. Atelectasis or consolidation in the right lung base is unchanged since prior study suggesting chronic or recurrent process. Cholelithiasis. Electronically Signed   By: Lucienne Capers M.D.   On: 11/23/2016 02:45   Dg Chest Port 1 View  Result Date: 11/23/2016 CLINICAL DATA:  Acute onset of shortness of breath and wheezing. Initial encounter. EXAM: PORTABLE CHEST 1 VIEW COMPARISON:  Chest radiograph performed 09/22/2015, and CTA of the chest performed 09/24/2015 FINDINGS: The lungs are well-aerated. Right hilar prominence is stable in appearance and appears to reflect the patient's vasculature. Mild bibasilar atelectasis is seen. There is no evidence of pleural effusion or pneumothorax. The cardiomediastinal silhouette is borderline enlarged. No acute osseous abnormalities are seen. IMPRESSION: Mild bibasilar atelectasis noted.  Borderline cardiomegaly. Electronically Signed   By: Garald Balding M.D.   On: 11/23/2016 00:38    EKG:   Orders placed or performed during the hospital encounter of 11/22/16  . ED EKG  . ED EKG    ASSESSMENT AND PLAN:  #1 shortness of breath ,cough secondary to influenza type A:; Continue Tamiflu, no hypoxia. Discontinue IV fluids. CT chest no pneumonia. History of PE before. On chronic anticoagulation. Suspected UTI: Urine cultures are pending. Continue Rocephin.  #2.  Thrombocytosis: Patient is on hydroxyurea, started on that, platelets are high at 785.  #3. h/o polycythemia vera #  #4 essential hypertension #5 history of COPD: No wheezing #6 patient has history of diverticulitis status post colon surgery,, colostomy.  Likely discharge tomorrow. #7 physical therapy consult. [facility].  All the records are reviewed and case discussed with Care Management/Social Workerr. Management plans discussed with the patient, family and they are in agreement.  CODE STATUS: full  TOTAL TIME TAKING CARE OF THIS PATIENT: 35  minutes.   POSSIBLE D/C IN 1-2 DAYS, DEPENDING ON CLINICAL CONDITION.   Epifanio Lesches M.D on 11/24/2016 at 1:59 PM  Between 7am to 6pm - Pager - [phone]  After 6pm go to [email or web address] - password [email or web address] [facility]  Office  [phone]  CC: Primary care physician; PROVIDER NOT IN SYSTEM   Note: This dictation was prepared with Dragon dictation along with smaller phrase technology. Any transcriptional errors that result from this process are unintentional.

## 2016-11-24 NOTE — Progress Notes (Signed)
ANTICOAGULATION CONSULT NOTE - Follow Up Consult  Pharmacy Consult for Warfarin Indication: h/o DVT, continuation of home therapy  No Known Allergies  Patient Measurements: Height: 5\' 6"  (167.6 cm) Weight: 211 lb 1.6 oz (95.8 kg) IBW/kg (Calculated) : 59.3 Heparin Dosing Weight:    Vital Signs: Temp: 99.6 F (37.6 C) (01/15 0451) Temp Source: Oral (01/15 0451) BP: 136/63 (01/15 0451) Pulse Rate: 83 (01/15 0451)  Labs:  Recent Labs  11/22/16 2338 11/23/16 0329 11/24/16 0545  HGB 13.6  --  12.3  HCT 41.3  --  36.7  PLT 1,032*  --  785*  LABPROT 31.7*  --  36.8*  INR 2.99  --  3.60  CREATININE 0.70  --   --   TROPONINI <0.03 <0.03  --     Estimated Creatinine Clearance: 63.3 mL/min (by C-G formula based on SCr of 0.7 mg/dL).   Medications:  Prescriptions Prior to Admission  Medication Sig Dispense Refill Last Dose  . aspirin EC 81 MG tablet Take 1 tablet (81 mg total) by mouth daily. 30 tablet 2 Taking  . docusate sodium (COLACE) 100 MG capsule Take 100 mg by mouth 2 (two) times daily as needed for mild constipation.    Taking  . folic acid (FOLVITE) [id number] MCG tablet Take 400 mcg by mouth daily.   Taking  . hydroxyurea (HYDREA) 500 MG capsule Pt takes 4 capsules on Wed, Saturday, and Sunday,take 3 capsules on Monday, Tues Thurs, and Friday. 24 capsule 0 Taking  . ipratropium-albuterol (DUONEB) 0.5-2.5 (3) MG/3ML SOLN Take 3 mLs by nebulization every 4 (four) hours. 360 mL 6 Taking  . menthol-cetylpyridinium (CEPACOL) 3 MG lozenge Take 1 lozenge (3 mg total) by mouth as needed for sore throat. 100 tablet 12 Taking  . Polyethylene Glycol POWD Take 17 g by mouth daily. 527 g 5 Taking  . potassium chloride SA (K-DUR,KLOR-CON) 20 MEQ tablet Take 1 tablet (20 mEq total) by mouth 2 (two) times daily. 30 tablet 0 Taking  . warfarin (COUMADIN) 4 MG tablet Take 4 mg by mouth daily.   Taking  . warfarin (COUMADIN) 5 MG tablet Take 5 mg by mouth daily.   Taking     Assessment: Pharmacy consulted to dose and monitor warfarin therapy in this 83 year old woman. Patient takes warfarin 4 mg 3 x a week and 5 mg 4 x a week alternating with 4mg .  1/13 INR 2.99 1/14 no INR, warfarin 4mg  1/15 INR 3.60  Goal of Therapy:  INR 2-3 Monitor platelets by anticoagulation protocol: Yes   Plan:  Will hold today's dose of warfarin. Continue with daily INR checks.  Paulina Fusi, PharmD, BCPS 11/24/2016 9:17 AM

## 2016-11-24 NOTE — Progress Notes (Signed)
Per chart patient is from [facility]. FL2 complete.   [employer], LCSW [phone]

## 2016-11-24 NOTE — NC FL2 (Signed)
[place] LEVEL OF CARE SCREENING TOOL     IDENTIFICATION  Patient Name: Yolanda Wells Birthdate: 04/29/1932 Sex: female Admission Date (Current Location): 11/22/2016  [facility] and [place] Number:  Engineering geologist and Address:  [facility], [address] [place], [place] [address]      Provider Number: [id number]  Attending Physician Name and Address:  Epifanio Lesches, MD  Relative Name and Phone Number:       Current Level of Care: Hospital Recommended Level of Care: [place] Prior Approval Number:    Date Approved/Denied:   PASRR Number:  ([id number])  Discharge Plan: Domiciliary (Rest home)    Current Diagnoses: Patient Active Problem List   Diagnosis Date Noted  . Respiratory distress 11/23/2016  . Vision loss of right eye 08/26/2016  . Blindness of right eye 08/26/2016  . Cannot walk 10/12/2015  . Presence of colostomy ([employer]) 10/04/2015  . Long term current use of anticoagulant 10/03/2015  . Pulmonary embolism ([place]) 10/03/2015  . Leukocytosis 09/25/2015  . Chest pain 09/25/2015  . COPD exacerbation ([place]) 09/24/2015  . Right lower lobe pneumonia ([place]) 09/24/2015  . Chronic obstructive pulmonary disease with acute exacerbation ([place]) 09/24/2015  . Dyspnea 09/22/2015  . Elevated troponin 09/15/2015  . UTI (lower urinary tract infection) 07/16/2015  . Abdominal wall cellulitis 07/15/2015  . DVT (deep venous thrombosis) ([place]) 07/15/2015  . HTN (hypertension) 07/15/2015  . Polycythemia vera ([place]) 07/15/2015  . Dependent edema 07/15/2015  . Arthritis 07/15/2015  . Pulmonary emboli ([place]) 05/25/2015  . Diverticulosis of colon with hemorrhage 04/02/2015  . Diverticula of intestine 04/02/2015  . Dehydration, moderate 01/11/2015  . Fall 01/10/2015  . BP (high blood pressure) 01/10/2015  . Hammer toe 09/02/2012  . History of amputation of foot ([place]) 09/02/2012  . Fungal infection of nail 09/02/2012   . Disease of skin and subcutaneous tissue 09/02/2012  . Pain in toe 09/02/2012  . Arthralgia of hip 08/23/2012  . History of repair of hip joint 08/23/2012  . Glenohumeral arthritis 06/28/2012    Orientation RESPIRATION BLADDER Height & Weight     Self, Time, Situation, Place  Normal Incontinent Weight: 211 lb 1.6 oz (95.8 kg) Height:  5\' 6"  (167.6 cm)  BEHAVIORAL SYMPTOMS/MOOD NEUROLOGICAL BOWEL NUTRITION STATUS   (none)  (none) Continent Diet (Diet: Heart Healthy )  AMBULATORY STATUS COMMUNICATION OF NEEDS Skin   Limited Assist Verbally Normal                       Personal Care Assistance Level of Assistance  Bathing, Feeding, Dressing Bathing Assistance: Limited assistance Feeding assistance: Independent Dressing Assistance: Limited assistance     Functional Limitations Info  Sight, Hearing, Speech Sight Info: Adequate Hearing Info: Adequate Speech Info: Adequate    SPECIAL CARE FACTORS FREQUENCY  PT (By licensed PT), OT (By licensed OT)     PT Frequency:  (2-3 days per week through home health ) OT Frequency:  (2-3 days per week through home health. )            Contractures      Additional Factors Info  Code Status, Allergies, Isolation Precautions Code Status Info:  (Full Code. ) Allergies Info:  (No Known Allergies. )     Isolation Precautions Info:  (Isolation for being positive for the flu. )     Current Medications (11/24/2016):  This is the current hospital active medication list Current Facility-Administered Medications  Medication Dose  Route Frequency Provider Last Rate Last Dose  . acetaminophen (TYLENOL) tablet 650 mg  650 mg Oral Q6H PRN Harrie Foreman, MD   650 mg at 11/23/16 0700   Or  . acetaminophen (TYLENOL) suppository 650 mg  650 mg Rectal Q6H PRN Harrie Foreman, MD      . aspirin EC tablet 81 mg  81 mg Oral Daily Harrie Foreman, MD   81 mg at 11/24/16 1054  . cefTRIAXone (ROCEPHIN) IVPB 1 g  1 g Intravenous Q24H  Harrie Foreman, MD   1 g at 11/24/16 0746  . docusate sodium (COLACE) capsule 100 mg  100 mg Oral BID PRN Harrie Foreman, MD      . folic acid (FOLVITE) tablet 0.5 mg  500 mcg Oral Daily Harrie Foreman, MD   0.5 mg at 11/24/16 1055  . hydroxyurea (HYDREA) capsule 1,500 mg  1,500 mg Oral Once per day on Mon Tue Thu Fri Epifanio Lesches, MD   1,500 mg at 11/24/16 1055  . hydroxyurea (HYDREA) capsule 2,000 mg  2,000 mg Oral Once per day on Sun Wed Sat Lenis Noon, RPH   2,000 mg at 11/23/16 1526  . Influenza vac split quadrivalent PF (FLUARIX) injection 0.5 mL  0.5 mL Intramuscular Tomorrow-1000 Epifanio Lesches, MD      . ipratropium-albuterol (DUONEB) 0.5-2.5 (3) MG/3ML nebulizer solution 3 mL  3 mL Nebulization Q6H Epifanio Lesches, MD   3 mL at 11/24/16 1414  . menthol-cetylpyridinium (CEPACOL) lozenge 3 mg  1 lozenge Oral PRN Harrie Foreman, MD      . ondansetron [facility]) tablet 4 mg  4 mg Oral Q6H PRN Harrie Foreman, MD       Or  . ondansetron [facility]) injection 4 mg  4 mg Intravenous Q6H PRN Harrie Foreman, MD      . oseltamivir (TAMIFLU) capsule 75 mg  75 mg Oral BID Harrie Foreman, MD   75 mg at 11/24/16 1054  . polyethylene glycol (MIRALAX / GLYCOLAX) packet 17 g  17 g Oral Daily Harrie Foreman, MD   17 g at 11/24/16 1054  . potassium chloride SA (K-DUR,KLOR-CON) CR tablet 20 mEq  20 mEq Oral BID Harrie Foreman, MD   20 mEq at 11/24/16 1054  . sodium chloride flush (NS) 0.9 % injection 3 mL  3 mL Intravenous Q12H Harrie Foreman, MD      . Warfarin - Pharmacist Dosing Inpatient   Does not apply [id number] Epifanio Lesches, MD         Discharge Medications: Please see discharge summary for a list of discharge medications.  Relevant Imaging Results:  Relevant Lab Results:   Additional Information  (SSN: [id number])  Alontae Chaloux, [place]onia Beets, LCSW

## 2016-11-24 NOTE — Care Management Obs Status (Signed)
[place] NOTIFICATION   Patient Details  Name: Kynzley Vegter MRN: [id number] Date of Birth: [birth year]   Medicare Observation Status Notification Given:  Yes    Shelbie Ammons, RN 11/24/2016, 12:08 PM

## 2016-11-25 LAB — PROTIME-INR
INR: 2.78
Prothrombin Time: 29.9 seconds — ABNORMAL HIGH (ref 11.4–15.2)

## 2016-11-25 LAB — BASIC METABOLIC PANEL
ANION GAP: 7 (ref 5–15)
BUN: 9 mg/dL (ref 6–20)
CHLORIDE: 107 mmol/L (ref 101–111)
CO2: 25 mmol/L (ref 22–32)
CREATININE: 0.41 mg/dL — AB (ref 0.44–1.00)
Calcium: 8.3 mg/dL — ABNORMAL LOW (ref 8.9–10.3)
GFR calc non Af Amer: >60 mL/min (ref >60)
Glucose, Bld: 95 mg/dL (ref 65–99)
Potassium: 4.4 mmol/L (ref 3.5–5.1)
Sodium: 139 mmol/L (ref 135–145)

## 2016-11-25 LAB — MAGNESIUM: Magnesium: 1.8 mg/dL (ref 1.7–2.4)

## 2016-11-25 MED ORDER — OSELTAMIVIR PHOSPHATE 75 MG PO CAPS: 75.0000 mg | ORAL_CAPSULE | Freq: Two times a day (BID) | ORAL | 0 refills | Status: DC

## 2016-11-25 MED ORDER — WARFARIN SODIUM 4 MG PO TABS
4.0000 mg | ORAL_TABLET | Freq: Once | ORAL | Status: AC
  Administered 2016-11-25: 18:00:00 4 mg via ORAL
  Filled 2016-11-25: qty 1

## 2016-11-25 MED ORDER — METOPROLOL SUCCINATE ER 25 MG PO TB24
12.5000 mg | ORAL_TABLET | Freq: Every day | ORAL | Status: DC
  Administered 2016-11-25 – 2016-11-26 (×2): 12.5 mg via ORAL
  Filled 2016-11-25 (×2): qty 1

## 2016-11-25 MED ORDER — PREDNISONE 50 MG PO TABS
50.0000 mg | ORAL_TABLET | Freq: Every day | ORAL | Status: DC
  Administered 2016-11-25 – 2016-11-30 (×6): 50 mg via ORAL
  Filled 2016-11-25 (×6): qty 1

## 2016-11-25 MED ORDER — PREDNISONE 50 MG PO TABS: 50.0000 mg | ORAL_TABLET | Freq: Every day | ORAL | 0 refills | Status: DC

## 2016-11-25 NOTE — Discharge Instructions (Signed)
Resume diet and activity as before ° ° °

## 2016-11-25 NOTE — Progress Notes (Signed)
[place] [facility] [place] NAME: Yolanda Wells    MR#:  [id number]  DATE OF BIRTH:  1932-10-14  SUBJECTIVE:  CHIEF COMPLAINT:   Chief Complaint  Patient presents with  . Shortness of Breath   Feels weak and congested in chest. Did not sleep well  REVIEW OF SYSTEMS:   ROS CONSTITUTIONAL:  Feels better today. EYES: No blurred or double vision.  EARS, NOSE, AND THROAT: No tinnitus or ear pain.  RESPIRATORY: No cough, shortness of breath, wheezing or hemoptysis.  CARDIOVASCULAR: No chest pain, orthopnea, edema.  GASTROINTESTINAL: No nausea, vomiting, diarrhea or abdominal pain. Colostomy bag  In middle of stomach GENITOURINARY: No dysuria, hematuria.  ENDOCRINE: No polyuria, nocturia,  HEMATOLOGY: No anemia, easy bruising or bleeding SKIN: No rash or lesion. MUSCULOSKELETAL: No joint pain or arthritis.   NEUROLOGIC: No tingling, numbness, weakness.  PSYCHIATRY: No anxiety or depression.   DRUG ALLERGIES:  No Known Allergies  VITALS:  Blood pressure 130/72, pulse 79, temperature 98.7 F (37.1 C), temperature source Oral, resp. rate 18, height 5\' 6"  (1.676 m), weight 96.3 kg (212 lb 6.4 oz), SpO2 97 %.  PHYSICAL EXAMINATION:  GENERAL:  83 y.o.-year-old patient lying in the bed with no acute distress.  EYES: Pupils equal, round, reactive to light and accommodation. No scleral icterus. Extraocular muscles intact.  HEENT: Head atraumatic, normocephalic. Oropharynx and nasopharynx clear.  NECK:  Supple, no jugular venous distention. No thyroid enlargement, no tenderness.  LUNGS: Normal breath sounds bilaterally, no rales,rhonchi or crepitation. No use of accessory muscles of respiration.  Bilateral wheezing CARDIOVASCULAR: S1, S2 normal. No murmurs, rubs, or gallops.  ABDOMEN: Soft, nontender, nondistended. Bowel sounds present. No organomegaly or mass. Colostomy bag in middle of stomach,  EXTREMITIES: No pedal edema, cyanosis, or  clubbing.  NEUROLOGIC: Cranial nerves II through XII are intact. Muscle strength 5/5 in all extremities. Sensation intact. Gait not checked.  PSYCHIATRIC: The patient is alert and oriented x 3.  SKIN: No obvious rash, lesion, or ulcer.    LABORATORY PANEL:   CBC  Recent Labs Lab 11/24/16 0545  WBC 2.4*  HGB 12.3  HCT 36.7  PLT 785*   ------------------------------------------------------------------------------------------------------------------  Chemistries   Recent Labs Lab 11/25/16 0420  NA 139  K 4.4  CL 107  CO2 25  GLUCOSE 95  BUN 9  CREATININE 0.41*  CALCIUM 8.3*  MG 1.8   ------------------------------------------------------------------------------------------------------------------  Cardiac Enzymes  Recent Labs Lab 11/23/16 0329  TROPONINI <0.03   ------------------------------------------------------------------------------------------------------------------  RADIOLOGY:  Dg Chest 1 View  Result Date: 11/24/2016 CLINICAL DATA:  Short of breath today EXAM: CHEST 1 VIEW COMPARISON:  11/22/2016 FINDINGS: The heart is moderately enlarged. No evidence of vascular congestion or pulmonary edema. Lungs are under aerated with bibasilar atelectasis. No pneumothorax or pleural effusion. IMPRESSION: Cardiomegaly without decompensation.  Bibasilar atelectasis. Electronically Signed   By: Marybelle Killings M.D.   On: 11/24/2016 17:00    EKG:   Orders placed or performed during the hospital encounter of 11/22/16  . ED EKG  . ED EKG    ASSESSMENT AND PLAN:  # Influenza A with SOB, weakness. On Tamiflu Will add prednisone and Nebs  # UTI Continue Rocephin.  #. Thrombocytosis: Patient is on hydroxyurea, started on that, platelets are high at 785.  #. h/o polycythemia vera #  # essential hypertension   CODE STATUS: full  TOTAL TIME TAKING CARE OF THIS PATIENT: 35  minutes.   POSSIBLE D/C IN  1-2 DAYS, DEPENDING ON CLINICAL CONDITION.   Hillary Bow R M.D on 11/25/2016 at 6:24 PM  Between 7am to 6pm - Pager - [phone]  After 6pm go to [email or web address] - password [email or web address] [place] [facility]  Office  [phone]  CC: Primary care physician; PROVIDER NOT IN SYSTEM   Note: This dictation was prepared with Dragon dictation along with smaller phrase technology. Any transcriptional errors that result from this process are unintentional.

## 2016-11-25 NOTE — Telephone Encounter (Signed)
Patient is currently in [facility]

## 2016-11-25 NOTE — Clinical Social Work Note (Addendum)
CSW spoke to patient and she said she is from [place] and her grandaughter checks on her on a regular basis.   Patient states she is planning to return back to [facility], and she will be moving in with her grandaughter in March.  Patient expressed she is looking forward to going home, but is hopeful she will feel better soon.  Case manager updated that patient is from [facility].  Case discussed with case manager and plan is to discharge home with home health.  CSW to sign off please re-consult if social work needs arise.  Jones Broom. [place], MSW, [place]  11/25/2016 3:16 PM

## 2016-11-25 NOTE — Care Management (Signed)
Spoke with granddaughter [name]. Discussed home health agencies. Would like [facility]. [facility] was in the home following discharge from this facility in October 2017. Information faxed to [facility]. Would like to be transported back to [facility] per [employer].  Granddaughter did indicate that her grandmother did not want to go home today.  Dr. Darvin Neighbours updated. Will come and talk to Ms. Spaziani at the bedside. Shelbie Ammons RN MSN CCM Care Management

## 2016-11-25 NOTE — Progress Notes (Signed)
ANTICOAGULATION CONSULT NOTE - Follow Up Consult  Pharmacy Consult for warfarin anticoagulation Indication: pulmonary embolus  No Known Allergies  Patient Measurements: Height: 5\' 6"  (167.6 cm) Weight: 212 lb 6.4 oz (96.3 kg) IBW/kg (Calculated) : 59.3 Heparin Dosing Weight: 80.6 kg  Vital Signs: Temp: 98.6 F (37 C) (01/16 0501) BP: 124/74 (01/16 0501) Pulse Rate: 77 (01/16 0501)  Labs:  Recent Labs  11/22/16 2338 11/23/16 0329 11/24/16 0545 11/25/16 0420  HGB 13.6  --  12.3  --   HCT 41.3  --  36.7  --   PLT 1,032*  --  785*  --   LABPROT 31.7*  --  36.8* 29.9*  INR 2.99  --  3.60 2.78  CREATININE 0.70  --   --  0.41*  TROPONINI <0.03 <0.03  --   --     Estimated Creatinine Clearance: 63.4 mL/min (by C-G formula based on SCr of 0.41 mg/dL (L)).   Medications:  Scheduled:  . aspirin EC  81 mg Oral Daily  . cefTRIAXone  1 g Intravenous Q24H  . folic acid  [id number] mcg Oral Daily  . hydroxyurea  1,500 mg Oral Once per day on Mon Tue Thu Fri  . hydroxyurea  2,000 mg Oral Once per day on Sun Wed Sat  . Influenza vac split quadrivalent PF  0.5 mL Intramuscular Tomorrow-1000  . ipratropium-albuterol  3 mL Nebulization Q6H  . oseltamivir  75 mg Oral BID  . polyethylene glycol  17 g Oral Daily  . potassium chloride SA  20 mEq Oral BID  . sodium chloride flush  3 mL Intravenous Q12H  . warfarin  4 mg Oral ONCE-1800  . Warfarin - Pharmacist Dosing Inpatient   Does not apply q1800    Assessment: Patient admitted for increased cough found to be influenza A + and was started on tamiflu and ceftriaxone for UTI. 1/13 INR: 2.99 1/15 INR 3.60 1/16 INR 2.78  Goal of Therapy:  INR 2-3 Monitor platelets by anticoagulation protocol: Yes   Plan:  Will give one dose of warfarin 4 mg today (1/16) @ 1800 and will recheck INR 1/17 and will reassess. Will continue to monitor CBC/plts.  Thank you for this consult.  Tobie Lords, PharmD, BCPS Clinical  Pharmacist 11/25/2016

## 2016-11-25 NOTE — Progress Notes (Signed)
This RN made aware by CCMD that pt had a 11 beat run of [place].  Dr. Darvin Neighbours on the floor, made aware.  Clarise Cruz, RN

## 2016-11-25 NOTE — Telephone Encounter (Signed)
Patient is in [place].

## 2016-11-26 LAB — PROTIME-INR
INR: 2.23
PROTHROMBIN TIME: 25.1 s — AB (ref 11.4–15.2)

## 2016-11-26 MED ORDER — METOPROLOL TARTRATE 25 MG PO TABS
25.0000 mg | ORAL_TABLET | Freq: Two times a day (BID) | ORAL | Status: DC
  Administered 2016-11-26 – 2016-12-01 (×10): 25 mg via ORAL
  Filled 2016-11-26 (×10): qty 1

## 2016-11-26 MED ORDER — WARFARIN SODIUM 4 MG PO TABS
4.0000 mg | ORAL_TABLET | Freq: Once | ORAL | Status: AC
  Administered 2016-11-26: 16:00:00 4 mg via ORAL
  Filled 2016-11-26: qty 1

## 2016-11-26 NOTE — Progress Notes (Signed)
[place] [facility] [place] NAME: Yolanda Wells    MR#:  [id number]  DATE OF BIRTH:  Mar 30, 1932  SUBJECTIVE:  CHIEF COMPLAINT:   Chief Complaint  Patient presents with  . Shortness of Breath   Some improvement today. Wheezing better. Continues to have cough. Feels weak.  REVIEW OF SYSTEMS:   ROS CONSTITUTIONAL:  Feels better today. EYES: No blurred or double vision.  EARS, NOSE, AND THROAT: No tinnitus or ear pain.  RESPIRATORY: No cough, shortness of breath, wheezing or hemoptysis.  CARDIOVASCULAR: No chest pain, orthopnea, edema.  GASTROINTESTINAL: No nausea, vomiting, diarrhea or abdominal pain. Colostomy bag  In middle of stomach GENITOURINARY: No dysuria, hematuria.  ENDOCRINE: No polyuria, nocturia,  HEMATOLOGY: No anemia, easy bruising or bleeding SKIN: No rash or lesion. MUSCULOSKELETAL: No joint pain or arthritis.   NEUROLOGIC: No tingling, numbness, weakness.  PSYCHIATRY: No anxiety or depression.   DRUG ALLERGIES:  No Known Allergies  VITALS:  Blood pressure 132/73, pulse 70, temperature 97.8 F (36.6 C), temperature source Oral, resp. rate 18, height 5\' 6"  (1.676 m), weight 90.3 kg (199 lb), SpO2 99 %.  PHYSICAL EXAMINATION:  GENERAL:  83 y.o.-year-old patient lying in the bed with no acute distress.  EYES: Pupils equal, round, reactive to light and accommodation. No scleral icterus. Extraocular muscles intact.  HEENT: Head atraumatic, normocephalic. Oropharynx and nasopharynx clear.  NECK:  Supple, no jugular venous distention. No thyroid enlargement, no tenderness.  LUNGS: Normal breath sounds bilaterally, no rales,rhonchi or crepitation. No use of accessory muscles of respiration.  Bilateral wheezing CARDIOVASCULAR: S1, S2 normal. No murmurs, rubs, or gallops.  ABDOMEN: Soft, nontender, nondistended. Bowel sounds present. No organomegaly or mass. Colostomy bag in middle of stomach,  EXTREMITIES: No pedal edema,  cyanosis, or clubbing.  NEUROLOGIC: Cranial nerves II through XII are intact. Muscle strength 5/5 in all extremities. Sensation intact. Gait not checked.  PSYCHIATRIC: The patient is alert and oriented x 3.  SKIN: No obvious rash, lesion, or ulcer.    LABORATORY PANEL:   CBC  Recent Labs Lab 11/24/16 0545  WBC 2.4*  HGB 12.3  HCT 36.7  PLT 785*   ------------------------------------------------------------------------------------------------------------------  Chemistries   Recent Labs Lab 11/25/16 0420  NA 139  K 4.4  CL 107  CO2 25  GLUCOSE 95  BUN 9  CREATININE 0.41*  CALCIUM 8.3*  MG 1.8   ------------------------------------------------------------------------------------------------------------------  Cardiac Enzymes  Recent Labs Lab 11/23/16 0329  TROPONINI <0.03   ------------------------------------------------------------------------------------------------------------------  RADIOLOGY:  Dg Chest 1 View  Result Date: 11/24/2016 CLINICAL DATA:  Short of breath today EXAM: CHEST 1 VIEW COMPARISON:  11/22/2016 FINDINGS: The heart is moderately enlarged. No evidence of vascular congestion or pulmonary edema. Lungs are under aerated with bibasilar atelectasis. No pneumothorax or pleural effusion. IMPRESSION: Cardiomegaly without decompensation.  Bibasilar atelectasis. Electronically Signed   By: Marybelle Killings M.D.   On: 11/24/2016 17:00    EKG:   Orders placed or performed during the hospital encounter of 11/22/16  . ED EKG  . ED EKG    ASSESSMENT AND PLAN:  # Influenza A with SOB, weakness. On Tamiflu Added prednisone and Nebs  # UTI Continue Rocephin.  #. Thrombocytosis: Patient is on hydroxyurea, started on that, platelets are high at 785.  #. h/o polycythemia vera   # essential hypertension   CODE STATUS: full  TOTAL TIME TAKING CARE OF THIS PATIENT: 35  minutes.   Discharge tomorrow   Billijo Dilling,  [name] M.D on 11/26/2016 at 2:13  PM  Between 7am to 6pm - Pager - [phone]  After 6pm go to [email or web address] - password [email or web address] [place]  Office  [phone]  CC: Primary care physician; PROVIDER NOT IN SYSTEM   Note: This dictation was prepared with Dragon dictation along with smaller phrase technology. Any transcriptional errors that result from this process are unintentional.

## 2016-11-26 NOTE — Progress Notes (Signed)
Made Dr. Darvin Neighbours aware that pt has had 2 different episodes of VTACH.  New orders received.  Clarise Cruz, RN

## 2016-11-26 NOTE — Progress Notes (Signed)
ANTICOAGULATION CONSULT NOTE - Follow Up Consult  Pharmacy Consult for anticoagulation with warfarin Indication: pulmonary embolus  No Known Allergies  Patient Measurements: Height: 5\' 6"  (167.6 cm) Weight: 199 lb (90.3 kg) IBW/kg (Calculated) : 59.3 Heparin Dosing Weight: 80.6 kg  Vital Signs: Temp: 97.8 F (36.6 C) (01/17 0406) Temp Source: Oral (01/17 0406) BP: 132/73 (01/17 0406) Pulse Rate: 70 (01/17 0406)  Labs:  Recent Labs  11/24/16 0545 11/25/16 0420 11/26/16 0345  HGB 12.3  --   --   HCT 36.7  --   --   PLT 785*  --   --   LABPROT 36.8* 29.9* 25.1*  INR 3.60 2.78 2.23  CREATININE  --  0.41*  --     Estimated Creatinine Clearance: 61.4 mL/min (by C-G formula based on SCr of 0.41 mg/dL (L)).   Medications:  Scheduled:  . aspirin EC  81 mg Oral Daily  . cefTRIAXone  1 g Intravenous Q24H  . folic acid  [id number] mcg Oral Daily  . hydroxyurea  1,500 mg Oral Once per day on Mon Tue Thu Fri  . hydroxyurea  2,000 mg Oral Once per day on Sun Wed Sat  . Influenza vac split quadrivalent PF  0.5 mL Intramuscular Tomorrow-1000  . ipratropium-albuterol  3 mL Nebulization Q6H  . metoprolol succinate  12.5 mg Oral Daily  . oseltamivir  75 mg Oral BID  . polyethylene glycol  17 g Oral Daily  . potassium chloride SA  20 mEq Oral BID  . predniSONE  50 mg Oral Q breakfast  . sodium chloride flush  3 mL Intravenous Q12H  . warfarin  4 mg Oral ONCE-1800  . Warfarin - Pharmacist Dosing Inpatient   Does not apply q1800    Assessment: 1/13 INR: 2.99 1/15 INR 3.60 1/16 INR 2.78 1/17 INR 2.23  Goal of Therapy:  INR 2-3 Monitor platelets by anticoagulation protocol: Yes   Plan:  Will give one dose of warfarin 4 mg today (1/17) @ 1800 and will recheck INR 1/18 and will reassess. Will continue to monitor CBC/plts.  Thank you for this consult  Tobie Lords, PharmD, BCPS Clinical Pharmacist 11/26/2016

## 2016-11-27 LAB — PROTIME-INR
INR: 2.71
Prothrombin Time: 29.3 seconds — ABNORMAL HIGH (ref 11.4–15.2)

## 2016-11-27 MED ORDER — WARFARIN SODIUM 4 MG PO TABS
2.0000 mg | ORAL_TABLET | Freq: Once | ORAL | Status: AC
Start: 2016-11-27 — End: 2016-11-27
  Administered 2016-11-27: 18:00:00 2 mg via ORAL
  Filled 2016-11-27: qty 1

## 2016-11-27 MED ORDER — IPRATROPIUM-ALBUTEROL 0.5-2.5 (3) MG/3ML IN SOLN: 3.0000 mL | Freq: Four times a day (QID) | RESPIRATORY_TRACT | Status: DC | PRN

## 2016-11-27 MED ORDER — IPRATROPIUM-ALBUTEROL 0.5-2.5 (3) MG/3ML IN SOLN
3.0000 mL | Freq: Three times a day (TID) | RESPIRATORY_TRACT | Status: DC
  Administered 2016-11-27 – 2016-11-30 (×9): 3 mL via RESPIRATORY_TRACT
  Filled 2016-11-27 (×9): qty 3

## 2016-11-27 NOTE — Progress Notes (Signed)
[place] at [place] NAME: Yolanda Wells    MR#:  [id number]  DATE OF BIRTH:  [birth year]  SUBJECTIVE:  CHIEF COMPLAINT:   Chief Complaint  Patient presents with  . Shortness of Breath   Some improvement today. Wheezing better. Feels weak.  REVIEW OF SYSTEMS:   ROS   CONSTITUTIONAL:  Feels better today. EYES: No blurred or double vision.  EARS, NOSE, AND THROAT: No tinnitus or ear pain.  RESPIRATORY: No cough, shortness of breath, wheezing or hemoptysis.  CARDIOVASCULAR: No chest pain, orthopnea, edema.  GASTROINTESTINAL: No nausea, vomiting, diarrhea or abdominal pain. Colostomy bag  In middle of stomach GENITOURINARY: No dysuria, hematuria.  ENDOCRINE: No polyuria, nocturia,  HEMATOLOGY: No anemia, easy bruising or bleeding SKIN: No rash or lesion. MUSCULOSKELETAL: No joint pain or arthritis.   NEUROLOGIC: No tingling, numbness, weakness.  PSYCHIATRY: No anxiety or depression.   DRUG ALLERGIES:  No Known Allergies  VITALS:  Blood pressure 136/72, pulse 73, temperature 98.6 F (37 C), resp. rate 20, height 5\' 6"  (1.676 m), weight 90.3 kg (199 lb), SpO2 96 %.  PHYSICAL EXAMINATION:  GENERAL:  83 y.o.-year-old patient lying in the bed with no acute distress.  EYES: Pupils equal, round, reactive to light and accommodation. No scleral icterus. Extraocular muscles intact.  HEENT: Head atraumatic, normocephalic. Oropharynx and nasopharynx clear.  NECK:  Supple, no jugular venous distention. No thyroid enlargement, no tenderness.  LUNGS: Normal breath sounds bilaterally, no rales,rhonchi or crepitation. No use of accessory muscles of respiration.  Bilateral wheezing CARDIOVASCULAR: S1, S2 normal. No murmurs, rubs, or gallops.  ABDOMEN: Soft, nontender, nondistended. Bowel sounds present. No organomegaly or mass. Colostomy bag in middle of stomach,  EXTREMITIES: No pedal edema, cyanosis, or clubbing.  NEUROLOGIC: Cranial  nerves II through XII are intact. Muscle strength 5/5 in all extremities. Sensation intact. Gait not checked.  PSYCHIATRIC: The patient is alert and oriented x 3.  SKIN: No obvious rash, lesion, or ulcer.    LABORATORY PANEL:   CBC  Recent Labs Lab 11/24/16 0545  WBC 2.4*  HGB 12.3  HCT 36.7  PLT 785*   ------------------------------------------------------------------------------------------------------------------  Chemistries   Recent Labs Lab 11/25/16 0420  NA 139  K 4.4  CL 107  CO2 25  GLUCOSE 95  BUN 9  CREATININE 0.41*  CALCIUM 8.3*  MG 1.8   ------------------------------------------------------------------------------------------------------------------  Cardiac Enzymes  Recent Labs Lab 11/23/16 0329  TROPONINI <0.03   ------------------------------------------------------------------------------------------------------------------  RADIOLOGY:  No results found.  EKG:   Orders placed or performed during the hospital encounter of 11/22/16  . ED EKG  . ED EKG    ASSESSMENT AND PLAN:  # Influenza A with SOB, weakness On Tamiflu Added prednisone and Nebs  # UTI Continue Rocephin.  # Thrombocytosis: Patient is on hydroxyurea  platelets are high at 785.  #. h/o polycythemia vera   # essential hypertension   CODE STATUS: Full  TOTAL TIME TAKING CARE OF THIS PATIENT: 35  minutes.   Waiting for bed at SNF.  Hillary Bow R M.D on 11/27/2016 at 1:48 PM  Between 7am to 6pm - Pager - [phone]  After 6pm go to [email or web address] - password [email or web address] [place] [place] [facility]  Office  [phone]  CC: Primary care physician; PROVIDER NOT IN SYSTEM   Note: This dictation was prepared with Dragon dictation along with smaller phrase technology. Any transcriptional errors that result from this process are unintentional.

## 2016-11-27 NOTE — Progress Notes (Signed)
Physical Therapy Treatment Patient Details Name: Yolanda Wells MRN: [id number] DOB: [birth year] Today's Date: 11/27/2016    History of Present Illness Pt is an 83 y.o. female presenting to hospital with SOB and hypoxia.  Pt admitted with intermittent viral sepsis secondary to UTI and influenza type A.  PMH includes R toe amp, B THR, R LE DVT, stroke, blindness R eye, colostomy, PE, COPD with exacerbation.    PT Comments    Pt in bed ready for session.  She required [facility] raised fully and heavy reliance on rail with mod A for Le's to get to edge of bed.  Once sitting she is able to maintain her balance with supervision.  She stood with min a x 2 but required mod a x 2 to transfer 3' to chair at bedside.  Gait/transfer is generally unsteady with short choppy steps.  She requires +2 assist for balance and safety at this time.   Pt is from Independent living facility where she is alone for extended periods of time during the day.  Due to increased need for assist with transfers and general mobility she would benefit from SNF to return to prior level of function.  Discussed with care manager and primary nurse.     Follow Up Recommendations  SNF     Equipment Recommendations  Rolling walker with 5" wheels    Recommendations for Other Services       Precautions / Restrictions Precautions Precautions: Fall Restrictions Weight Bearing Restrictions: No    Mobility  Bed Mobility Overal bed mobility: Needs Assistance Bed Mobility: Supine to Sit     Supine to sit: Mod assist     General bed mobility comments: HOB raised fully and heavy reliance on rail  Transfers Overall transfer level: Needs assistance Equipment used: Rolling walker (2 wheeled) Transfers: Sit to/from Stand Sit to Stand: Min assist;+2 physical assistance         General transfer comment: unsteady with verbal cues for hand placements  Ambulation/Gait Ambulation/Gait assistance: Mod assist;+2 physical  assistance Ambulation Distance (Feet): 3 Feet Assistive device: Rolling walker (2 wheeled) Gait Pattern/deviations: Step-to pattern;Decreased step length - right;Decreased step length - left   Gait velocity interpretation: Below normal speed for age/gender General Gait Details: Pt initialy reported in evaluation she no longer walks, today she stated she is able to walk short distaces with therapy at [facility]   Stairs            Wheelchair Mobility    Modified Rankin (Stroke Patients Only)       Balance Overall balance assessment: Needs assistance Sitting-balance support: Bilateral upper extremity supported;Feet supported Sitting balance-Leahy Scale: Fair     Standing balance support: Bilateral upper extremity supported Standing balance-Leahy Scale: Poor Standing balance comment: Generally unsteady today with transfer to chair requring +2 assist                    Cognition Arousal/Alertness: Awake/alert Behavior During Therapy: WFL for tasks assessed/performed Overall Cognitive Status: Within Functional Limits for tasks assessed                      Exercises      General Comments        Pertinent Vitals/Pain Pain Assessment: No/denies pain    Home Living                      Prior Function  PT Goals (current goals can now be found in the care plan section)      Frequency    Min 2X/week      PT Plan      Co-evaluation             End of Session Equipment Utilized During Treatment: Gait belt Activity Tolerance: Patient limited by fatigue Patient left: in chair;with call bell/phone within reach     Time: [id number] PT Time Calculation (min) (ACUTE ONLY): 25 min  Charges:  $Gait Training: 8-22 mins $Therapeutic Exercise: 8-22 mins                    G Codes:      Chesley Noon 27-Dec-2016, 11:31 AM

## 2016-11-27 NOTE — Clinical Social Work Note (Signed)
CSW presented bed offers to patient and her grandaughter and they chose [facility].  [facility]W contacted [facility] who said they can accept patient once authorization has been received.  [facility] will begin insurance authorization.  Jones Broom. [place], MSW, [place]  11/27/2016 4:59 PM

## 2016-11-27 NOTE — Progress Notes (Signed)
ANTICOAGULATION CONSULT NOTE - Follow Up Consult  Pharmacy Consult for Warfarin Indication: h/o DVT, continuation of home therapy  No Known Allergies  Patient Measurements: Height: 5\' 6"  (167.6 cm) Weight: 199 lb (90.3 kg) IBW/kg (Calculated) : 59.3 Heparin Dosing Weight:    Vital Signs: Temp: 98.6 F (37 C) (01/18 0356) BP: 136/65 (01/18 0356) Pulse Rate: 68 (01/18 0356)  Labs:  Recent Labs  11/25/16 0420 11/26/16 0345 11/27/16 0348  LABPROT 29.9* 25.1* 29.3*  INR 2.78 2.23 2.71  CREATININE 0.41*  --   --     Estimated Creatinine Clearance: 61.4 mL/min (by C-G formula based on SCr of 0.41 mg/dL (L)).   Medications:  Prescriptions Prior to Admission  Medication Sig Dispense Refill Last Dose  . metoprolol tartrate (LOPRESSOR) 25 MG tablet Take 12.5 mg by mouth 2 (two) times daily.   11/22/2016 at 0800  . aspirin EC 81 MG tablet Take 1 tablet (81 mg total) by mouth daily. 30 tablet 2 11/22/2016 at 0800  . docusate sodium (COLACE) 100 MG capsule Take 100 mg by mouth 2 (two) times daily as needed for mild constipation.    prn at prn  . folic acid (FOLVITE) [id number] MCG tablet Take 400 mcg by mouth daily.   11/22/2016 at 0800  . hydroxyurea (HYDREA) 500 MG capsule Pt takes 4 capsules on Wed, Saturday, and Sunday,take 3 capsules on Monday, Tues Thurs, and Friday. (Patient taking differently: 2,000 mg daily. ) 24 capsule 0 11/22/2016 at 0800  . ipratropium-albuterol (DUONEB) 0.5-2.5 (3) MG/3ML SOLN Take 3 mLs by nebulization every 4 (four) hours. (Patient not taking: Reported on 11/25/2016) 360 mL 6 Not Taking at Unknown time  . menthol-cetylpyridinium (CEPACOL) 3 MG lozenge Take 1 lozenge (3 mg total) by mouth as needed for sore throat. (Patient not taking: Reported on 11/25/2016) 100 tablet 12 Not Taking at Unknown time  . Polyethylene Glycol POWD Take 17 g by mouth daily. (Patient not taking: Reported on 11/25/2016) 527 g 5 Not Taking at Unknown time  . potassium chloride SA  (K-DUR,KLOR-CON) 20 MEQ tablet Take 1 tablet (20 mEq total) by mouth 2 (two) times daily. 30 tablet 0 11/22/2016 at 0800  . warfarin (COUMADIN) 4 MG tablet Take 4 mg by mouth daily.   11/20/2016 at 1500  . warfarin (COUMADIN) 5 MG tablet Take 5 mg by mouth once a week.    11/21/2016 at 1500    Assessment: Pharmacy consulted to dose and monitor warfarin therapy in this 83 year old woman. Patient takes warfarin 4 mg 3 x a week and 5 mg 4 x a week alternating with 4mg .  1/13 INR 2.99 1/14 no INR, warfarin 4mg  1/15 INR 3.60, no warfarin 1/16 INR 2.78, warfarin 4mg  1/17 INR 2.23, warfarin 4mg  1/18 INR 2.71  Goal of Therapy:  INR 2-3 Monitor platelets by anticoagulation protocol: Yes   Plan:  Will decrease dose to 2mg  today due to significant increase in INR in one day. Continue with daily INR checks.  Paulina Fusi, PharmD, BCPS 11/27/2016 9:06 AM

## 2016-11-27 NOTE — NC FL2 (Signed)
[place] LEVEL OF CARE SCREENING TOOL     IDENTIFICATION  Patient Name: Yolanda Wells Birthdate: 1932-11-07 Sex: female Admission Date (Current Location): 11/22/2016  [place] and [place] Number:  Engineering geologist and Address:  [facility], [address] [place], [place] [address]      Provider Number: [id number]  Attending Physician Name and Address:  Hillary Bow, MD  Relative Name and Phone Number:     Trixie Deis [phone]  [phone]      Current Level of Care: Hospital Recommended Level of Care: [place] Prior Approval Number:    Date Approved/Denied:   PASRR Number: [id number]  Discharge Plan: SNF    Current Diagnoses: Patient Active Problem List   Diagnosis Date Noted  . Respiratory distress 11/23/2016  . Vision loss of right eye 08/26/2016  . Blindness of right eye 08/26/2016  . Cannot walk 10/12/2015  . Presence of colostomy ([place]) 10/04/2015  . Long term current use of anticoagulant 10/03/2015  . Pulmonary embolism ([place]) 10/03/2015  . Leukocytosis 09/25/2015  . Chest pain 09/25/2015  . COPD exacerbation ([place]) 09/24/2015  . Right lower lobe pneumonia ([place]) 09/24/2015  . Chronic obstructive pulmonary disease with acute exacerbation ([place]) 09/24/2015  . Dyspnea 09/22/2015  . Elevated troponin 09/15/2015  . UTI (lower urinary tract infection) 07/16/2015  . Abdominal wall cellulitis 07/15/2015  . DVT (deep venous thrombosis) ([place]) 07/15/2015  . HTN (hypertension) 07/15/2015  . Polycythemia vera ([place]) 07/15/2015  . Dependent edema 07/15/2015  . Arthritis 07/15/2015  . Pulmonary emboli ([place]) 05/25/2015  . Diverticulosis of colon with hemorrhage 04/02/2015  . Diverticula of intestine 04/02/2015  . Dehydration, moderate 01/11/2015  . Fall 01/10/2015  . BP (high blood pressure) 01/10/2015  . Hammer toe 09/02/2012  . History of amputation of foot ([place]) 09/02/2012  . Fungal  infection of nail 09/02/2012  . Disease of skin and subcutaneous tissue 09/02/2012  . Pain in toe 09/02/2012  . Arthralgia of hip 08/23/2012  . History of repair of hip joint 08/23/2012  . Glenohumeral arthritis 06/28/2012    Orientation RESPIRATION BLADDER Height & Weight     Self, Time, Situation, Place  Normal Incontinent Weight: 199 lb (90.3 kg) Height:  5\' 6"  (167.6 cm)  BEHAVIORAL SYMPTOMS/MOOD NEUROLOGICAL BOWEL NUTRITION STATUS   (none)  (none) Continent Diet (Diet: Heart Healthy )  AMBULATORY STATUS COMMUNICATION OF NEEDS Skin   Limited Assist Verbally Normal                       Personal Care Assistance Level of Assistance  Bathing, Feeding, Dressing Bathing Assistance: Limited assistance Feeding assistance: Independent Dressing Assistance: Limited assistance     Functional Limitations Info  Sight, Hearing, Speech Sight Info: Adequate Hearing Info: Adequate Speech Info: Adequate    SPECIAL CARE FACTORS FREQUENCY  PT (By licensed PT), OT (By licensed OT)     PT Frequency: Minimum 2x a week OT Frequency: Minimum 2x a week            Contractures Contractures Info: Not present    Additional Factors Info  Code Status, Allergies, Isolation Precautions Code Status Info: Full Allergies Info: NKA     Isolation Precautions Info:  (Droplet precautions for Flu)     Current Medications (11/27/2016):  This is the current hospital active medication list Current Facility-Administered Medications  Medication Dose Route Frequency Provider Last Rate Last Dose  . acetaminophen (TYLENOL) tablet 650 mg  650  mg Oral Q6H PRN Harrie Foreman, MD   650 mg at 11/23/16 0700   Or  . acetaminophen (TYLENOL) suppository 650 mg  650 mg Rectal Q6H PRN Harrie Foreman, MD      . aspirin EC tablet 81 mg  81 mg Oral Daily Harrie Foreman, MD   81 mg at 11/26/16 1018  . cefTRIAXone (ROCEPHIN) IVPB 1 g  1 g Intravenous Q24H Harrie Foreman, MD   1 g at 11/27/16 0753  .  docusate sodium (COLACE) capsule 100 mg  100 mg Oral BID PRN Harrie Foreman, MD   100 mg at 11/26/16 [id number]  . folic acid (FOLVITE) tablet 0.5 mg  500 mcg Oral Daily Harrie Foreman, MD   0.5 mg at 11/26/16 1018  . hydroxyurea (HYDREA) capsule 1,500 mg  1,500 mg Oral Once per day on Mon Tue Thu Fri Epifanio Lesches, MD   1,500 mg at 11/25/16 [id number]  . hydroxyurea (HYDREA) capsule 2,000 mg  2,000 mg Oral Once per day on Sun Wed Sat Lenis Noon, RPH   2,000 mg at 11/26/16 1017  . Influenza vac split quadrivalent PF (FLUARIX) injection 0.5 mL  0.5 mL Intramuscular Tomorrow-1000 Epifanio Lesches, MD      . ipratropium-albuterol (DUONEB) 0.5-2.5 (3) MG/3ML nebulizer solution 3 mL  3 mL Nebulization Q6H Epifanio Lesches, MD   3 mL at 11/27/16 0750  . menthol-cetylpyridinium (CEPACOL) lozenge 3 mg  1 lozenge Oral PRN Harrie Foreman, MD      . metoprolol tartrate (LOPRESSOR) tablet 25 mg  25 mg Oral BID Hillary Bow, MD   25 mg at 11/26/16 1613  . ondansetron (ZOFRAN) tablet 4 mg  4 mg Oral Q6H PRN Harrie Foreman, MD       Or  . ondansetron [facility]) injection 4 mg  4 mg Intravenous Q6H PRN Harrie Foreman, MD      . oseltamivir (TAMIFLU) capsule 75 mg  75 mg Oral BID Harrie Foreman, MD   75 mg at 11/26/16 2118  . polyethylene glycol (MIRALAX / GLYCOLAX) packet 17 g  17 g Oral Daily Harrie Foreman, MD   17 g at 11/25/16 0916  . potassium chloride SA (K-DUR,KLOR-CON) CR tablet 20 mEq  20 mEq Oral BID Harrie Foreman, MD   20 mEq at 11/26/16 2118  . predniSONE (DELTASONE) tablet 50 mg  50 mg Oral Q breakfast Hillary Bow, MD   50 mg at 11/27/16 0753  . sodium chloride flush (NS) 0.9 % injection 3 mL  3 mL Intravenous Q12H Harrie Foreman, MD   3 mL at 11/26/16 2119  . warfarin (COUMADIN) tablet 2 mg  2 mg Oral ONCE-1800 Hillary Bow, MD      . Warfarin - Pharmacist Dosing Inpatient   Does not apply [id number] Epifanio Lesches, MD         Discharge Medications: Please see  discharge summary for a list of discharge medications.  Relevant Imaging Results:  Relevant Lab Results:   Additional Information SSN [id number]  Ross Ludwig, [place]

## 2016-11-28 LAB — PROTIME-INR
INR: 3.01
Prothrombin Time: 31.9 seconds — ABNORMAL HIGH (ref 11.4–15.2)

## 2016-11-28 NOTE — Clinical Social Work Note (Signed)
Clinical Social Work Assessment  Patient Details  Name: Yolanda Wells MRN: [id number] Date of Birth: 1931-11-16  Date of referral:  11/27/16               Reason for consult:  Facility Placement                Permission sought to share information with:  Facility Sport and exercise psychologist, Family Supports Permission granted to share information::  Yes, Verbal Permission Granted  Name::     Trixie Deis [phone]  [phone]   Agency::  SNF admissions  Relationship::     Contact Information:     Housing/Transportation Living arrangements for the past 2 months:  [place] of Information:  Patient Patient Interpreter Needed:  None Criminal Activity/Legal Involvement Pertinent to Current Situation/Hospitalization:  No - Comment as needed Significant Relationships:  Other Family Members Lives with:  Facility Resident Do you feel safe going back to the place where you live?  No Need for family participation in patient care:  No (Coment)  Care giving concerns:  Patient feels she needs some short term rehab before she is able to return back home.   Social Worker assessment / plan:  Patient is a 83 year old female who is from [place].  Patient states she has been to rehab in the past, CSW reminded patient what the process is for SNF placemen and what to expect.  Patient was explained what to expect day of discharge and how insurance will pay for her stay.  Patient expressed that she is okay with going to SNF and gave CSW permission to begin bed search process in [facility].  Employment status:  Retired Nurse, adult PT Recommendations:  [place] / Referral to community resources:     Patient/Family's Response to care:  Patient in agreement to going to SNF for rehab.  Patient/Family's Understanding of and Emotional Response to Diagnosis, Current Treatment, and  Prognosis:  Patient aware of current diagnosis and treatment plan.  Patient is hopeful she will not have to be in SNF very long.  Emotional Assessment Appearance:  Appears stated age Attitude/Demeanor/Rapport:    Affect (typically observed):  Appropriate, Calm Orientation:  Oriented to Self, Oriented to Place, Oriented to  Time, Oriented to Situation Alcohol / Substance use:  Not Applicable Psych involvement (Current and /or in the community):  No (Comment)  Discharge Needs  Concerns to be addressed:  Lack of Support Readmission within the last 30 days:  No Current discharge risk:  Lack of support system, Lives alone Barriers to Discharge:  Insurance Authorization   Anell Barr 11/28/2016, 5:56 PM

## 2016-11-28 NOTE — Clinical Social Work Note (Signed)
CSW contacted [facility] [place] and they still have not received authorization for patient go to SNF.  CSW continuing to follow patient's progress throughout discharge planning.  Jones Broom. [place], MSW, [place]  11/28/2016 5:54 PM

## 2016-11-28 NOTE — Progress Notes (Signed)
ANTICOAGULATION CONSULT NOTE - Follow Up Consult  Pharmacy Consult for anticoagulation with warfarin Indication: h/o DVT  No Known Allergies  Patient Measurements: Height: 5\' 6"  (167.6 cm) Weight: 203 lb 14.4 oz (92.5 kg) IBW/kg (Calculated) : 59.3 Heparin Dosing Weight:  Vital Signs: Temp: 98.6 F (37 C) (01/19 0431) Temp Source: Oral (01/18 2224) BP: 143/74 (01/19 0431) Pulse Rate: 58 (01/19 0431)  Labs:  Recent Labs  11/26/16 0345 11/27/16 0348 11/28/16 0348  LABPROT 25.1* 29.3* 31.9*  INR 2.23 2.71 3.01    Estimated Creatinine Clearance: 62.1 mL/min (by C-G formula based on SCr of 0.41 mg/dL (L)).   Medications:  Scheduled:  . aspirin EC  81 mg Oral Daily  . folic acid  [id number] mcg Oral Daily  . hydroxyurea  1,500 mg Oral Once per day on Mon Tue Thu Fri  . hydroxyurea  2,000 mg Oral Once per day on Sun Wed Sat  . Influenza vac split quadrivalent PF  0.5 mL Intramuscular Tomorrow-1000  . ipratropium-albuterol  3 mL Nebulization TID  . metoprolol tartrate  25 mg Oral BID  . polyethylene glycol  17 g Oral Daily  . potassium chloride SA  20 mEq Oral BID  . predniSONE  50 mg Oral Q breakfast  . sodium chloride flush  3 mL Intravenous Q12H  . Warfarin - Pharmacist Dosing Inpatient   Does not apply q1800    Assessment: INR 1/15 3.6 INR 1/16 2.78 INR 1/17 2.23 INR 1/18 2.71 INR 1/19 3.01  Goal of Therapy:  INR 2-3 Monitor platelets by anticoagulation protocol: Yes   Plan:  INR jumped from 2.71 to 3.01 from 4 mg dose on 1/17. Patient received 2 mg on 1/18, but I anticipate his INR will continue to increase. Will hold today's dose on 1/19 and will reassess INR tomorrow.  Thank You for this consult.  Tobie Lords, PharmD, BCPS Clinical Pharmacist 11/28/2016

## 2016-11-28 NOTE — Progress Notes (Signed)
[place] at [place] NAME: Yolanda Wells    MR#:  [id number]  DATE OF BIRTH:  [birth year]  SUBJECTIVE:  CHIEF COMPLAINT:   Chief Complaint  Patient presents with  . Shortness of Breath   IMproving slowly  REVIEW OF SYSTEMS:   ROS   CONSTITUTIONAL:  Feels better today. EYES: No blurred or double vision.  EARS, NOSE, AND THROAT: No tinnitus or ear pain.  RESPIRATORY: No cough, shortness of breath, wheezing or hemoptysis.  CARDIOVASCULAR: No chest pain, orthopnea, edema.  GASTROINTESTINAL: No nausea, vomiting, diarrhea or abdominal pain. Colostomy bag  In middle of stomach GENITOURINARY: No dysuria, hematuria.  ENDOCRINE: No polyuria, nocturia,  HEMATOLOGY: No anemia, easy bruising or bleeding SKIN: No rash or lesion. MUSCULOSKELETAL: No joint pain or arthritis.   NEUROLOGIC: No tingling, numbness, weakness.  PSYCHIATRY: No anxiety or depression.   DRUG ALLERGIES:  No Known Allergies  VITALS:  Blood pressure 123/68, pulse 72, temperature 98.3 F (36.8 C), temperature source Oral, resp. rate 18, height 5\' 6"  (1.676 m), weight 92.5 kg (203 lb 14.4 oz), SpO2 99 %.  PHYSICAL EXAMINATION:  GENERAL:  83 y.o.-year-old patient lying in the bed with no acute distress.  EYES: Pupils equal, round, reactive to light and accommodation. No scleral icterus. Extraocular muscles intact.  HEENT: Head atraumatic, normocephalic. Oropharynx and nasopharynx clear.  NECK:  Supple, no jugular venous distention. No thyroid enlargement, no tenderness.  LUNGS: Normal breath sounds bilaterally, no rales,rhonchi or crepitation. No use of accessory muscles of respiration.  Bilateral wheezing CARDIOVASCULAR: S1, S2 normal. No murmurs, rubs, or gallops.  ABDOMEN: Soft, nontender, nondistended. Bowel sounds present. No organomegaly or mass. Colostomy bag in middle of stomach,  EXTREMITIES: No pedal edema, cyanosis, or clubbing.  NEUROLOGIC: Cranial  nerves II through XII are intact. Muscle strength 5/5 in all extremities. Sensation intact. Gait not checked.  PSYCHIATRIC: The patient is alert and oriented x 3.  SKIN: No obvious rash, lesion, or ulcer.    LABORATORY PANEL:   CBC  Recent Labs Lab 11/24/16 0545  WBC 2.4*  HGB 12.3  HCT 36.7  PLT 785*   ------------------------------------------------------------------------------------------------------------------  Chemistries   Recent Labs Lab 11/25/16 0420  NA 139  K 4.4  CL 107  CO2 25  GLUCOSE 95  BUN 9  CREATININE 0.41*  CALCIUM 8.3*  MG 1.8   ------------------------------------------------------------------------------------------------------------------  Cardiac Enzymes  Recent Labs Lab 11/23/16 0329  TROPONINI <0.03   ------------------------------------------------------------------------------------------------------------------  RADIOLOGY:  No results found.  EKG:   Orders placed or performed during the hospital encounter of 11/22/16  . ED EKG  . ED EKG    ASSESSMENT AND PLAN:  # Influenza A with SOB, weakness On Tamiflu Added prednisone and Nebs  Much improved.   # Providencia UTI Finished Rocephin course.  # Thrombocytosis: Patient is on hydroxyurea  #. h/o polycythemia vera   # essential hypertension  Discharge to SNF when bed and insurance approval available  CODE STATUS: Full  TOTAL TIME TAKING CARE OF THIS PATIENT: 35  minutes.   Waiting for bed at SNF.  Hillary Bow R M.D on 11/28/2016 at 1:11 PM  Between 7am to 6pm - Pager - [phone]  After 6pm go to [email or web address] - password [email or web address] [facility]  Office  [phone]  CC: Primary care physician; PROVIDER NOT IN SYSTEM   Note: This dictation was prepared with Dragon dictation along with smaller phrase technology. Any transcriptional errors that result  from this process are unintentional.

## 2016-11-28 NOTE — Progress Notes (Signed)
Pt refusing to take medications until she talks to her grandaughter about her coumadin.  I spoke with Ms Yolanda Wells and explained INR level and she said that she would call her grandmother and explain.

## 2016-11-28 NOTE — Discharge Summary (Addendum)
[place] at [place] NAME: Yolanda Wells    MR#:  [id number]  DATE OF BIRTH:  [birth year]  DATE OF ADMISSION:  11/22/2016 ADMITTING PHYSICIAN: Harrie Foreman, MD  DATE OF DISCHARGE: 12/01/2016  PRIMARY CARE PHYSICIAN: PROVIDER NOT IN SYSTEM   ADMISSION DIAGNOSIS:  Influenza [J11.1]  DISCHARGE DIAGNOSIS:  Active Problems:   Respiratory distress   Influenza with respiratory manifestation  SECONDARY DIAGNOSIS:   Past Medical History:  Diagnosis Date  . Arthritis   . Collagen vascular disease ([place])   . Deep venous thrombosis (HCC)    right lower extremity  . Dependent edema   . History of bilateral hip replacements   . History of hysterectomy   . Hypertension   . Polycythemia vera ([place])   . 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 The patient states that her granddaughter and primary caretaker has been ill with the flu. She has developed a cough over the last 2 days that is occasionally productive of thick white sputum. She admits to subjective fevers as well as occasional nausea but denies vomiting. A evaluation in the emergency department revealed urinary tract infection as well as positive influenza. Due to overall debility as well as intermittent tachypnea the emergency department staff called the hospitalist service for admission.  HOSPITAL COURSE:   # Influenza A with SOB, weakness Finished 5 day Tamiflu Added prednisone and Nebs. Finished prednisone  # Providencia UTI Finished Rocephin course.  # Thrombocytosis: Patient is on hydroxyurea  #. h/o polycythemia vera   # essential hypertension  Stable for discharge to SNF for PT  CONSULTS  OBTAINED:    DRUG ALLERGIES:  No Known Allergies  DISCHARGE MEDICATIONS:   Current Discharge Medication List    START taking these medications   Details  !! ipratropium-albuterol (DUONEB) 0.5-2.5 (3) MG/3ML SOLN Take 3 mLs by nebulization every 6 (six) hours as needed. Qty: 3 mL, Refills: 30     !! - Potential duplicate medications found. Please discuss with provider.    CONTINUE these medications which have NOT CHANGED   Details  metoprolol tartrate (LOPRESSOR) 25 MG tablet Take 12.5 mg by mouth 2 (two) times daily.    aspirin EC 81 MG tablet Take 1 tablet (81 mg total) by mouth daily. Qty: 30 tablet, Refills: 2    docusate sodium (COLACE) 100 MG capsule Take 100 mg by mouth 2 (two) times daily as needed for mild constipation.     folic acid (FOLVITE) A999333 MCG tablet Take 400 mcg by mouth daily.    hydroxyurea (HYDREA) 500 MG capsule Pt takes 4 capsules on Wed, Saturday, and Sunday,take 3 capsules on Monday, Tues Thurs, and Friday. Qty: 24 capsule, Refills: 0    !! ipratropium-albuterol (DUONEB) 0.5-2.5 (3) MG/3ML SOLN Take 3 mLs by nebulization every 4 (four) hours. Qty: 360 mL, Refills: 6    menthol-cetylpyridinium (CEPACOL) 3 MG lozenge Take 1 lozenge (3 mg total) by mouth as needed for sore throat. Qty: 100 tablet, Refills: 12    Polyethylene Glycol POWD Take 17 g by mouth daily. Qty: 527 g, Refills: 5    potassium chloride SA (K-DUR,KLOR-CON) 20 MEQ tablet Take 1 tablet (20 mEq total)  by mouth 2 (two) times daily. Qty: 30 tablet, Refills: 0    warfarin (COUMADIN) 4 MG tablet Take 4 mg by mouth daily.     !! - Potential duplicate medications found. Please discuss with provider.      Today   VITAL SIGNS:  Blood pressure (!) 146/77, pulse (!) 59, temperature 98.1 F (36.7 C), temperature source Oral, resp. rate 20, height 5\' 6"  (1.676 m), weight 92.7 kg (204 lb 4.8 oz), SpO2 100 %.  I/O:    Intake/Output Summary (Last 24 hours) at 12/01/16 1227 Last data  filed at 11/30/16 1550  Gross per 24 hour  Intake                0 ml  Output              150 ml  Net             -150 ml    PHYSICAL EXAMINATION:  Physical Exam  GENERAL:  83 y.o.-year-old patient lying in the bed with no acute distress.  LUNGS: Normal breath sounds bilaterally, no wheezing, rales,rhonchi or crepitation. No use of accessory muscles of respiration.  CARDIOVASCULAR: S1, S2 normal. No murmurs, rubs, or gallops.  ABDOMEN: Soft, non-tender, non-distended. Bowel sounds present. No organomegaly or mass.  NEUROLOGIC: Moves all 4 extremities. PSYCHIATRIC: The patient is alert and oriented x 3.  SKIN: No obvious rash, lesion, or ulcer.   DATA REVIEW:   CBC  Recent Labs Lab 11/30/16 0437  WBC 4.4  HGB 13.4  HCT 40.6  PLT 812*    Chemistries   Recent Labs Lab 11/25/16 0420  NA 139  K 4.4  CL 107  CO2 25  GLUCOSE 95  BUN 9  CREATININE 0.41*  CALCIUM 8.3*  MG 1.8    Cardiac Enzymes  Recent Labs Lab 11/23/16 0329  TROPONINI <0.03    Microbiology Results  Results for orders placed or performed during the hospital encounter of 11/22/16  MRSA PCR Screening     Status: None   Collection Time: 11/23/16  6:28 AM  Result Value Ref Range Status   MRSA by PCR NEGATIVE NEGATIVE Final    Comment:        The GeneXpert MRSA Assay (FDA approved for NASAL specimens only), is one component of a comprehensive MRSA colonization surveillance program. It is not intended to diagnose MRSA infection nor to guide or monitor treatment for MRSA infections.     RADIOLOGY:  No results found.  Follow up with PCP in 1 week.  Management plans discussed with the patient, family and they are in agreement.  CODE STATUS:     Code Status Orders        Start     Ordered   11/23/16 0629  Full code  Continuous     11/23/16 0629    Code Status History    Date Active Date Inactive Code Status Order ID Comments User Context   08/26/2016  4:45 AM 08/26/2016   7:23 PM Full Code [id number]  Saundra Shelling, MD Inpatient   09/22/2015  5:55 PM 09/25/2015  6:44 PM Full Code [id number]  Demetrios Loll, MD ED   09/15/2015  3:41 AM 09/17/2015  5:41 PM Full Code [id number]  Harrie Foreman, MD Inpatient   07/16/2015  1:51 AM 07/20/2015 11:34 PM Full Code [id number]  Lance Coon, MD Inpatient   05/25/2015  8:34 AM 05/29/2015  4:16 PM Full Code [id number]  Harrie Foreman, MD Inpatient  04/02/2015  6:06 AM 04/08/2015  4:51 PM Full Code [id number]  Harrie Foreman, MD Inpatient    Advance Directive Documentation   Flowsheet Row Most Recent Value  Type of Advance Directive  Healthcare Power of Attorney, Living will  Pre-existing out of facility DNR order (yellow form or pink MOST form)  No data  "MOST" Form in Place?  No data      TOTAL TIME TAKING CARE OF THIS PATIENT ON DAY OF DISCHARGE: more than 30 minutes.   Hillary Bow R M.D on 11/28/2016 at 1:07 PM  Between 7am to 6pm - Pager - [phone]  After 6pm go to [email or web address] - password [email or web address] [facility]  Office  [phone]  CC: Primary care physician; PROVIDER NOT IN SYSTEM  Note: This dictation was prepared with Dragon dictation along with smaller phrase technology. Any transcriptional errors that result from this process are unintentional.

## 2016-11-29 LAB — PROTIME-INR
INR: 2.69
PROTHROMBIN TIME: 29.1 s — AB (ref 11.4–15.2)

## 2016-11-29 MED ORDER — WARFARIN SODIUM 3 MG PO TABS
3.0000 mg | ORAL_TABLET | Freq: Once | ORAL | Status: AC
  Administered 2016-11-29: 18:00:00 3 mg via ORAL
  Filled 2016-11-29: qty 1

## 2016-11-29 MED ORDER — IPRATROPIUM-ALBUTEROL 0.5-2.5 (3) MG/3ML IN SOLN: 3.0000 mL | Freq: Four times a day (QID) | RESPIRATORY_TRACT | 30 refills | Status: DC | PRN

## 2016-11-29 NOTE — Discharge Summary (Addendum)
[place] at [place] NAME: Yolanda Wells    MR#:  [id number]  DATE OF BIRTH:  November 13, 1931  DATE OF ADMISSION:  11/22/2016 ADMITTING PHYSICIAN: Harrie Foreman, MD  DATE OF DISCHARGE: No discharge date for patient encounter.  PRIMARY CARE PHYSICIAN: PROVIDER NOT IN SYSTEM   ADMISSION DIAGNOSIS:  Influenza [J11.1]  DISCHARGE DIAGNOSIS:  Active Problems:   Respiratory distress   SECONDARY DIAGNOSIS:   Past Medical History:  Diagnosis Date  . Arthritis   . Collagen vascular disease ([place])   . Deep venous thrombosis (HCC)    right lower extremity  . Dependent edema   . History of bilateral hip replacements   . History of hysterectomy   . Hypertension   . Polycythemia vera ([place])   . 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 The patient states that her granddaughter and primary caretaker has been ill with the flu. She has developed a cough over the last 2 days that is occasionally productive of thick white sputum. She admits to subjective fevers as well as occasional nausea but denies vomiting. A evaluation in the emergency department revealed urinary tract infection as well as positive influenza. Due to overall debility as well as intermittent tachypnea the emergency department staff called the hospitalist service for admission.  HOSPITAL COURSE:   # Influenza A with SOB, weakness On Tamiflu Added prednisone and Nebs  Much improved. Close to baseline  # Providencia UTI Finished Rocephin course.  # Thrombocytosis: Patient is on hydroxyurea  #. h/o polycythemia vera '[Patient takes the Coumadin, Coumadin dosing as per INR.   keep INR 2-3.  #  essential hypertension  Stable for discharge to SNF for PT  CONSULTS OBTAINED:    DRUG ALLERGIES:  No Known Allergies  DISCHARGE MEDICATIONS:   Current Discharge Medication List    START taking these medications   Details  !! ipratropium-albuterol (DUONEB) 0.5-2.5 (3) MG/3ML SOLN Take 3 mLs by nebulization every 6 (six) hours as needed. Qty: 3 mL, Refills: 30    oseltamivir (TAMIFLU) 75 MG capsule Take 1 capsule (75 mg total) by mouth 2 (two) times daily. Qty: 7 capsule, Refills: 0    predniSONE (DELTASONE) 50 MG tablet Take 1 tablet (50 mg total) by mouth daily with breakfast. Qty: 4 tablet, Refills: 0     !! - Potential duplicate medications found. Please discuss with provider.    CONTINUE these medications which have NOT CHANGED   Details  metoprolol tartrate (LOPRESSOR) 25 MG tablet Take 12.5 mg by mouth 2 (two) times daily.    aspirin EC 81 MG tablet Take 1 tablet (81 mg total) by mouth daily. Qty: 30 tablet, Refills: 2    docusate sodium (COLACE) 100 MG capsule Take 100 mg by mouth 2 (two) times daily as needed for mild constipation.     folic acid (FOLVITE) [id number] MCG tablet Take 400 mcg by mouth daily.    hydroxyurea (HYDREA) 500 MG capsule Pt takes 4 capsules on Wed, Saturday, and Sunday,take 3 capsules on Monday, Tues Thurs, and Friday. Qty: 24 capsule, Refills: 0    !! ipratropium-albuterol (DUONEB) 0.5-2.5 (3) MG/3ML SOLN Take 3 mLs by nebulization every 4 (four) hours.  Qty: 360 mL, Refills: 6    menthol-cetylpyridinium (CEPACOL) 3 MG lozenge Take 1 lozenge (3 mg total) by mouth as needed for sore throat. Qty: 100 tablet, Refills: 12    Polyethylene Glycol POWD Take 17 g by mouth daily. Qty: 527 g, Refills: 5    potassium chloride SA (K-DUR,KLOR-CON) 20 MEQ tablet Take 1 tablet (20 mEq total) by mouth 2 (two) times daily. Qty: 30 tablet, Refills: 0    warfarin (COUMADIN) 4 MG tablet Take 4 mg by mouth daily.     !! - Potential duplicate medications  found. Please discuss with provider.      Today   VITAL SIGNS:  Blood pressure 113/69, pulse (!) 57, temperature 98.4 F (36.9 C), temperature source Oral, resp. rate 20, height 5\' 6"  (1.676 m), weight 93.3 kg (205 lb 9.6 oz), SpO2 100 %.  I/O:  No intake or output data in the 24 hours ending 11/29/16 0859  PHYSICAL EXAMINATION:  Physical Exam  GENERAL:  83 y.o.-year-old patient lying in the bed with no acute distress.  LUNGS: Normal breath sounds bilaterally, no wheezing, rales,rhonchi or crepitation. No use of accessory muscles of respiration.  CARDIOVASCULAR: S1, S2 normal. No murmurs, rubs, or gallops.  ABDOMEN: Soft, non-tender, non-distended. Bowel sounds present. No organomegaly or mass.  NEUROLOGIC: Moves all 4 extremities. PSYCHIATRIC: The patient is alert and oriented x 3.  SKIN: No obvious rash, lesion, or ulcer.   DATA REVIEW:   CBC  Recent Labs Lab 11/24/16 0545  WBC 2.4*  HGB 12.3  HCT 36.7  PLT 785*    Chemistries   Recent Labs Lab 11/25/16 0420  NA 139  K 4.4  CL 107  CO2 25  GLUCOSE 95  BUN 9  CREATININE 0.41*  CALCIUM 8.3*  MG 1.8    Cardiac Enzymes  Recent Labs Lab 11/23/16 0329  TROPONINI <0.03    Microbiology Results  Results for orders placed or performed during the hospital encounter of 11/22/16  MRSA PCR Screening     Status: None   Collection Time: 11/23/16  6:28 AM  Result Value Ref Range Status   MRSA by PCR NEGATIVE NEGATIVE Final    Comment:        The GeneXpert MRSA Assay (FDA approved for NASAL specimens only), is one component of a comprehensive MRSA colonization surveillance program. It is not intended to diagnose MRSA infection nor to guide or monitor treatment for MRSA infections.     RADIOLOGY:  No results found.  Follow up with PCP in 1 week.  Management plans discussed with the patient, family and they are in agreement.  CODE STATUS:     Code Status Orders        Start     Ordered    11/23/16 0629  Full code  Continuous     11/23/16 0629    Code Status History    Date Active Date Inactive Code Status Order ID Comments User Context   08/26/2016  4:45 AM 08/26/2016  7:23 PM Full Code [id number]  Saundra Shelling, MD Inpatient   09/22/2015  5:55 PM 09/25/2015  6:44 PM Full Code [id number]  Demetrios Loll, MD ED   09/15/2015  3:41 AM 09/17/2015  5:41 PM Full Code [id number]  Harrie Foreman, MD Inpatient   07/16/2015  1:51 AM 07/20/2015 11:34 PM Full Code [id number]  Lance Coon, MD Inpatient   05/25/2015  8:34 AM 05/29/2015  4:16 PM Full Code [id number]  Harrie Foreman, MD Inpatient  04/02/2015  6:06 AM 04/08/2015  4:51 PM Full Code [id number]  Harrie Foreman, MD Inpatient    Advance Directive Documentation   Flowsheet Row Most Recent Value  Type of Advance Directive  Healthcare Power of Attorney, Living will  Pre-existing out of facility DNR order (yellow form or pink MOST form)  No data  "MOST" Form in Place?  No data      TOTAL TIME TAKING CARE OF THIS PATIENT ON DAY OF DISCHARGE: more than 30 minutes.   Epifanio Lesches M.D on 11/29/2016 at 8:59 AM  Between 7am to 6pm - Pager - [phone]  After 6pm go to [email or web address] - password [email or web address] [facility]  Office  [phone]  CC: Primary care physician; PROVIDER NOT IN SYSTEM  Note: This dictation was prepared with Dragon dictation along with smaller phrase technology. Any transcriptional errors that result from this process are unintentional.

## 2016-11-29 NOTE — Clinical Social Work Note (Signed)
CSW has had no update from [employer] regarding insurance authorization. [place] does not auth on weekends. PT may reeval for possible dispo change. CSW con't to follow.  Santiago Bumpers, MSW, [place] [phone]

## 2016-11-29 NOTE — Progress Notes (Signed)
ANTICOAGULATION CONSULT NOTE - Follow Up Consult  Pharmacy Consult for anticoagulation with warfarin Indication: h/o DVT  No Known Allergies  Patient Measurements: Height: 5\' 6"  (167.6 cm) Weight: 205 lb 9.6 oz (93.3 kg) IBW/kg (Calculated) : 59.3 Heparin Dosing Weight:  Vital Signs: Temp: 98.4 F (36.9 C) (01/20 0419) Temp Source: Oral (01/20 0419) BP: 123/55 (01/20 0940) Pulse Rate: 66 (01/20 0940)  Labs:  Recent Labs  11/27/16 0348 11/28/16 0348 11/29/16 0512  LABPROT 29.3* 31.9* 29.1*  INR 2.71 3.01 2.69    Estimated Creatinine Clearance: 62.4 mL/min (by C-G formula based on SCr of 0.41 mg/dL (L)).   Medications:  Scheduled:  . aspirin EC  81 mg Oral Daily  . folic acid  [id number] mcg Oral Daily  . hydroxyurea  1,500 mg Oral Once per day on Mon Tue Thu Fri  . hydroxyurea  2,000 mg Oral Once per day on Sun Wed Sat  . Influenza vac split quadrivalent PF  0.5 mL Intramuscular Tomorrow-1000  . ipratropium-albuterol  3 mL Nebulization TID  . metoprolol tartrate  25 mg Oral BID  . polyethylene glycol  17 g Oral Daily  . potassium chloride SA  20 mEq Oral BID  . predniSONE  50 mg Oral Q breakfast  . sodium chloride flush  3 mL Intravenous Q12H  . Warfarin - Pharmacist Dosing Inpatient   Does not apply q1800    Assessment: PTA-Patient takes warfarin 4 mg 3 x a week and 5 mg 4 x a week alternating with 4mg .  INR 1/15  3.6      Dose HELD INR 1/16  2.78    4 mg  INR 1/17  2.23    4 mg INR 1/18  2.71    2 mg INR 1/19  3.01    Dose HELD INR 1/20  2.69  Goal of Therapy:  INR 2-3 Monitor platelets by anticoagulation protocol: Yes   Plan:  Will order Warfarin 3 mg x 1 today and will reassess INR tomorrow.  Thank You for this consult.  Chinita Greenland PharmD Clinical Pharmacist 11/29/2016

## 2016-11-29 NOTE — Progress Notes (Signed)
[place] [facility] [place] NAME: Yolanda Wells    MR#:  [id number]  DATE OF BIRTH:  1932-08-30  SUBJECTIVE: Still has some cough but otherwise stable stable for discharge and arrangements are made.   CHIEF COMPLAINT:   Chief Complaint  Patient presents with  . Shortness of Breath   IMproving slowly  REVIEW OF SYSTEMS:   ROS   CONSTITUTIONAL:  Feels better today. EYES: No blurred or double vision.  EARS, NOSE, AND THROAT: No tinnitus or ear pain.  RESPIRATORY: No cough, shortness of breath, wheezing or hemoptysis.  CARDIOVASCULAR: No chest pain, orthopnea, edema.  GASTROINTESTINAL: No nausea, vomiting, diarrhea or abdominal pain. Colostomy bag  In middle of stomach GENITOURINARY: No dysuria, hematuria.  ENDOCRINE: No polyuria, nocturia,  HEMATOLOGY: No anemia, easy bruising or bleeding SKIN: No rash or lesion. MUSCULOSKELETAL: No joint pain or arthritis.   NEUROLOGIC: No tingling, numbness, weakness.  PSYCHIATRY: No anxiety or depression.   DRUG ALLERGIES:  No Known Allergies  VITALS:  Blood pressure 113/69, pulse (!) 57, temperature 98.4 F (36.9 C), temperature source Oral, resp. rate 20, height 5\' 6"  (1.676 m), weight 93.3 kg (205 lb 9.6 oz), SpO2 100 %.  PHYSICAL EXAMINATION:  GENERAL:  83 y.o.-year-old patient lying in the bed with no acute distress.  EYES: Pupils equal, round, reactive to light and accommodation. No scleral icterus. Extraocular muscles intact.  HEENT: Head atraumatic, normocephalic. Oropharynx and nasopharynx clear.  NECK:  Supple, no jugular venous distention. No thyroid enlargement, no tenderness.  LUNGS: Normal breath sounds bilaterally, no rales,rhonchi or crepitation. No use of accessory muscles of respiration.  Bilateral wheezing CARDIOVASCULAR: S1, S2 normal. No murmurs, rubs, or gallops.  ABDOMEN: Soft, nontender, nondistended. Bowel sounds present. No organomegaly or mass. Colostomy bag in  middle of stomach,  EXTREMITIES: No pedal edema, cyanosis, or clubbing.  NEUROLOGIC: Cranial nerves II through XII are intact. Muscle strength 5/5 in all extremities. Sensation intact. Gait not checked.  PSYCHIATRIC: The patient is alert and oriented x 3.  SKIN: No obvious rash, lesion, or ulcer.    LABORATORY PANEL:   CBC  Recent Labs Lab 11/24/16 0545  WBC 2.4*  HGB 12.3  HCT 36.7  PLT 785*   ------------------------------------------------------------------------------------------------------------------  Chemistries   Recent Labs Lab 11/25/16 0420  NA 139  K 4.4  CL 107  CO2 25  GLUCOSE 95  BUN 9  CREATININE 0.41*  CALCIUM 8.3*  MG 1.8   ------------------------------------------------------------------------------------------------------------------  Cardiac Enzymes  Recent Labs Lab 11/23/16 0329  TROPONINI <0.03   ------------------------------------------------------------------------------------------------------------------  RADIOLOGY:  No results found.  EKG:   Orders placed or performed during the hospital encounter of 11/22/16  . ED EKG  . ED EKG    ASSESSMENT AND PLAN:  # Influenza A with SOB, weakness On Tamiflu Added prednisone and Nebs Not much wheezing today.    # Providencia UTI Finished Rocephin course.  # Thrombocytosis: Patient is on hydroxyurea  #. h/o polycythemia vera   # essential hypertension  Discharge to SNF when bed and insurance approval available  CODE STATUS: Full  TOTAL TIME TAKING CARE OF THIS PATIENT: 35  minutes.   Waiting for bed at [facility].  Epifanio Lesches M.D on 11/29/2016 at 8:55 AM  Between 7am to 6pm - Pager - [phone]  After 6pm go to [email or web address] - password [email or web address] [facility]  Office  [phone]  CC: Primary care physician; PROVIDER NOT IN SYSTEM   Note: This  dictation was prepared with Dragon dictation along with smaller phrase technology. Any  transcriptional errors that result from this process are unintentional.

## 2016-11-30 LAB — CBC
HEMATOCRIT: 40.6 % (ref 35.0–47.0)
Hemoglobin: 13.4 g/dL (ref 12.0–16.0)
MCH: 29.3 pg (ref 26.0–34.0)
MCHC: 33.1 g/dL (ref 32.0–36.0)
MCV: 88.5 fL (ref 80.0–100.0)
Platelets: 812 10*3/uL — ABNORMAL HIGH (ref 150–440)
RBC: 4.58 MIL/uL (ref 3.80–5.20)
RDW: 28.7 % — AB (ref 11.5–14.5)
WBC: 4.4 10*3/uL (ref 3.6–11.0)

## 2016-11-30 LAB — PROTIME-INR
INR: 2.27
Prothrombin Time: 25.4 seconds — ABNORMAL HIGH (ref 11.4–15.2)

## 2016-11-30 MED ORDER — IPRATROPIUM-ALBUTEROL 0.5-2.5 (3) MG/3ML IN SOLN
3.0000 mL | Freq: Two times a day (BID) | RESPIRATORY_TRACT | Status: DC
  Administered 2016-12-01: 3 mL via RESPIRATORY_TRACT
  Filled 2016-11-30: qty 3

## 2016-11-30 MED ORDER — WARFARIN SODIUM 3 MG PO TABS
3.0000 mg | ORAL_TABLET | Freq: Every day | ORAL | Status: AC
  Administered 2016-11-30: 3 mg via ORAL
  Filled 2016-11-30: qty 1

## 2016-11-30 MED ORDER — PREDNISONE 5 MG PO TABS
30.0000 mg | ORAL_TABLET | Freq: Every day | ORAL | Status: DC
  Administered 2016-12-01: 30 mg via ORAL
  Filled 2016-11-30: qty 1

## 2016-11-30 NOTE — Progress Notes (Signed)
[place] at [place] NAME: Yolanda Wells    MR#:  [id number]  DATE OF BIRTH:  [birth year]  SUBJECTIVE: she  denies any complaints. No shortness  of breath or chest pain.   CHIEF COMPLAINT:   Chief Complaint  Patient presents with  . Shortness of Breath   IMproving slowly  REVIEW OF SYSTEMS:   ROS   CONSTITUTIONAL:  Feels better today. EYES: No blurred or double vision.  EARS, NOSE, AND THROAT: No tinnitus or ear pain.  RESPIRATORY: No cough, shortness of breath, wheezing or hemoptysis.  CARDIOVASCULAR: No chest pain, orthopnea, edema.  GASTROINTESTINAL: No nausea, vomiting, diarrhea or abdominal pain. Colostomy bag  In middle of stomach GENITOURINARY: No dysuria, hematuria.  ENDOCRINE: No polyuria, nocturia,  HEMATOLOGY: No anemia, easy bruising or bleeding SKIN: No rash or lesion. MUSCULOSKELETAL: No joint pain or arthritis.   NEUROLOGIC: No tingling, numbness, weakness.  PSYCHIATRY: No anxiety or depression.   DRUG ALLERGIES:  No Known Allergies  VITALS:  Blood pressure (!) 139/54, pulse 64, temperature 98.1 F (36.7 C), temperature source Oral, resp. rate 18, height 5\' 6"  (1.676 m), weight 93.4 kg (205 lb 14.4 oz), SpO2 100 %.  PHYSICAL EXAMINATION:  GENERAL:  83 y.o.-year-old patient lying in the bed with no acute distress.  EYES: Pupils equal, round, reactive to light and accommodation. No scleral icterus. Extraocular muscles intact.  HEENT: Head atraumatic, normocephalic. Oropharynx and nasopharynx clear.  NECK:  Supple, no jugular venous distention. No thyroid enlargement, no tenderness.  LUNGS: Normal breath sounds bilaterally, no rales,rhonchi or crepitation. No use of accessory muscles of respiration.  Faint  Expiratory wheezing CARDIOVASCULAR: S1, S2 normal. No murmurs, rubs, or gallops.  ABDOMEN: Soft, nontender, nondistended. Bowel sounds present. No organomegaly or mass. Colostomy bag in middle of stomach,   EXTREMITIES: No pedal edema, cyanosis, or clubbing.  NEUROLOGIC: Cranial nerves II through XII are intact. Muscle strength 5/5 in all extremities. Sensation intact. Gait not checked.  PSYCHIATRIC: The patient is alert and oriented x 3.  SKIN: No obvious rash, lesion, or ulcer.    LABORATORY PANEL:   CBC  Recent Labs Lab 11/30/16 0437  WBC 4.4  HGB 13.4  HCT 40.6  PLT 812*   ------------------------------------------------------------------------------------------------------------------  Chemistries   Recent Labs Lab 11/25/16 0420  NA 139  K 4.4  CL 107  CO2 25  GLUCOSE 95  BUN 9  CREATININE 0.41*  CALCIUM 8.3*  MG 1.8   ------------------------------------------------------------------------------------------------------------------  Cardiac Enzymes No results for input(s): TROPONINI in the last 168 hours. ------------------------------------------------------------------------------------------------------------------  RADIOLOGY:  No results found.  EKG:   Orders placed or performed during the hospital encounter of 11/22/16  . ED EKG  . ED EKG    ASSESSMENT AND PLAN:  # Influenza A with SOB, weakness finised Tamiflu for 5 days. Wean down prednisone    # Providencia UTI Finished Rocephin course.  # Thrombocytosis: Patient is on hydroxyurea  #. h/o polycythemia vera ;on coumadin  # essential hypertension  Discharge to SNF when bed and insurance approval available  CODE STATUS: Full  TOTAL TIME TAKING CARE OF THIS PATIENT: 35  minutes.   Waiting for bed at [facility]  Epifanio Lesches M.D on 11/30/2016 at 10:09 AM  Between 7am to 6pm - Pager - [phone]  After 6pm go to [email or web address] - password [email or web address] [facility]  Office  [phone]  CC: Primary care physician; PROVIDER NOT IN SYSTEM   Note: This dictation  was prepared with Dragon dictation along with smaller phrase technology. Any transcriptional  errors that result from this process are unintentional.

## 2016-11-30 NOTE — Progress Notes (Signed)
ANTICOAGULATION CONSULT NOTE - Follow Up Consult  Pharmacy Consult for anticoagulation with warfarin Indication: h/o DVT  No Known Allergies  Patient Measurements: Height: 5\' 6"  (167.6 cm) Weight: 205 lb 14.4 oz (93.4 kg) IBW/kg (Calculated) : 59.3 Heparin Dosing Weight:  Vital Signs: Temp: 98.1 F (36.7 C) (01/21 0412) Temp Source: Oral (01/21 0412) BP: 139/54 (01/21 0959) Pulse Rate: 64 (01/21 0959)  Labs:  Recent Labs  11/28/16 0348 11/29/16 0512 11/30/16 0437  HGB  --   --  13.4  HCT  --   --  40.6  PLT  --   --  812*  LABPROT 31.9* 29.1* 25.4*  INR 3.01 2.69 2.27    Estimated Creatinine Clearance: 62.4 mL/min (by C-G formula based on SCr of 0.41 mg/dL (L)).   Medications:  Scheduled:  . aspirin EC  81 mg Oral Daily  . folic acid  [id number] mcg Oral Daily  . hydroxyurea  1,500 mg Oral Once per day on Mon Tue Thu Fri  . hydroxyurea  2,000 mg Oral Once per day on Sun Wed Sat  . Influenza vac split quadrivalent PF  0.5 mL Intramuscular Tomorrow-1000  . ipratropium-albuterol  3 mL Nebulization TID  . metoprolol tartrate  25 mg Oral BID  . polyethylene glycol  17 g Oral Daily  . potassium chloride SA  20 mEq Oral BID  . [START ON 12/01/2016] predniSONE  30 mg Oral Q breakfast  . sodium chloride flush  3 mL Intravenous Q12H  . Warfarin - Pharmacist Dosing Inpatient   Does not apply q1800    Assessment: PTA-Patient takes warfarin 4 mg 3 x a week and 5 mg 4 x a week alternating with 4mg .  INR 1/15  3.6      Dose HELD INR 1/16  2.78    4 mg  INR 1/17  2.23    4 mg INR 1/18  2.71    2 mg INR 1/19  3.01    Dose HELD INR 1/20  2.69    3 mg INR 1/21  2.27  Goal of Therapy:  INR 2-3 Monitor platelets by anticoagulation protocol: Yes   Plan:  Will order Warfarin 3 mg daily and will reassess INR tomorrow.  Thank You for this consult.  Chinita Greenland PharmD Clinical Pharmacist 11/30/2016

## 2016-12-01 DIAGNOSIS — J111 Influenza due to unidentified influenza virus with other respiratory manifestations: Secondary | ICD-10-CM | POA: Diagnosis present

## 2016-12-01 LAB — PROTIME-INR
INR: 2.56
PROTHROMBIN TIME: 28 s — AB (ref 11.4–15.2)

## 2016-12-01 MED ORDER — WARFARIN SODIUM 3 MG PO TABS: 3.0000 mg | ORAL_TABLET | Freq: Every day | ORAL | Status: DC

## 2016-12-01 NOTE — Progress Notes (Signed)
Pt picked up by EMS, she is en route to [facility].

## 2016-12-01 NOTE — Clinical Social Work Note (Signed)
CSW received phone call that SNF has insurance authorization for patient to discharge today.  CSW updated physician, and patient's grandaughter.  Patient to be d/c'ed today to [facility] [place].  Patient and family agreeable to plans will transport via ems RN to call report to Theressa Stamps room 714 at [place], MSW, [place]

## 2016-12-01 NOTE — Progress Notes (Signed)
Report called to Theressa Stamps. All belongings packed and returned to patient. IV x1 removed. Pt was changed into a transfer pack and ems was called for transport to [facility] for rehab.

## 2016-12-01 NOTE — Progress Notes (Signed)
ANTICOAGULATION CONSULT NOTE - Follow Up Consult  Pharmacy Consult for anticoagulation with warfarin Indication: h/o DVT  No Known Allergies  Patient Measurements: Height: 5\' 6"  (167.6 cm) Weight: 204 lb 4.8 oz (92.7 kg) IBW/kg (Calculated) : 59.3 Heparin Dosing Weight:  Vital Signs: Temp: 98.1 F (36.7 C) (01/22 0450) Temp Source: Oral (01/22 0450) BP: 146/77 (01/22 0450) Pulse Rate: 59 (01/22 0450)  Labs:  Recent Labs  11/29/16 0512 11/30/16 0437 12/01/16 0457  HGB  --  13.4  --   HCT  --  40.6  --   PLT  --  812*  --   LABPROT 29.1* 25.4* 28.0*  INR 2.69 2.27 2.56    Estimated Creatinine Clearance: 62.2 mL/min (by C-G formula based on SCr of 0.41 mg/dL (L)).   Medications:  Scheduled:  . aspirin EC  81 mg Oral Daily  . folic acid  [id number] mcg Oral Daily  . hydroxyurea  1,500 mg Oral Once per day on Mon Tue Thu Fri  . hydroxyurea  2,000 mg Oral Once per day on Sun Wed Sat  . Influenza vac split quadrivalent PF  0.5 mL Intramuscular Tomorrow-1000  . ipratropium-albuterol  3 mL Nebulization BID  . metoprolol tartrate  25 mg Oral BID  . polyethylene glycol  17 g Oral Daily  . potassium chloride SA  20 mEq Oral BID  . predniSONE  30 mg Oral Q breakfast  . sodium chloride flush  3 mL Intravenous Q12H  . Warfarin - Pharmacist Dosing Inpatient   Does not apply q1800    Assessment: PTA-Patient takes warfarin 4 mg 3 x a week and 5 mg 4 x a week alternating with 4mg .  INR 1/15  3.6      Dose HELD INR 1/16  2.78    4 mg  INR 1/17  2.23    4 mg INR 1/18  2.71    2 mg INR 1/19  3.01    Dose HELD INR 1/20  2.69    3 mg INR 1/21  2.27; 3 mg  INR  1/22  2.56;  Goal of Therapy:  INR 2-3 Monitor platelets by anticoagulation protocol: Yes   Plan:  Will order Warfarin 3 mg daily and will reassess INR tomorrow.  Thank You for this consult.  Larene Beach, PharmD  Clinical Pharmacist 12/01/2016

## 2016-12-04 ENCOUNTER — Inpatient Hospital Stay: Payer: Medicare HMO

## 2016-12-04 ENCOUNTER — Inpatient Hospital Stay: Payer: Medicare HMO | Admitting: Hematology and Oncology

## 2016-12-16 ENCOUNTER — Emergency Department
Admission: EM | Admit: 2016-12-16 | Discharge: 2016-12-16 | Disposition: A | Discharge status: Home / Self Care | Payer: Medicare Other | Attending: Emergency Medicine | Admitting: Emergency Medicine

## 2016-12-16 ENCOUNTER — Encounter: Payer: Self-pay | Admitting: Emergency Medicine

## 2016-12-16 ENCOUNTER — Telehealth: Payer: Self-pay

## 2016-12-16 DIAGNOSIS — Z7982 Long term (current) use of aspirin: Secondary | ICD-10-CM | POA: Diagnosis not present

## 2016-12-16 DIAGNOSIS — K921 Melena: Secondary | ICD-10-CM | POA: Diagnosis present

## 2016-12-16 DIAGNOSIS — K922 Gastrointestinal hemorrhage, unspecified: Secondary | ICD-10-CM | POA: Insufficient documentation

## 2016-12-16 DIAGNOSIS — Z79899 Other long term (current) drug therapy: Secondary | ICD-10-CM | POA: Diagnosis not present

## 2016-12-16 DIAGNOSIS — N390 Urinary tract infection, site not specified: Secondary | ICD-10-CM | POA: Insufficient documentation

## 2016-12-16 DIAGNOSIS — J449 Chronic obstructive pulmonary disease, unspecified: Secondary | ICD-10-CM | POA: Diagnosis not present

## 2016-12-16 DIAGNOSIS — I10 Essential (primary) hypertension: Secondary | ICD-10-CM | POA: Insufficient documentation

## 2016-12-16 DIAGNOSIS — R791 Abnormal coagulation profile: Secondary | ICD-10-CM | POA: Insufficient documentation

## 2016-12-16 DIAGNOSIS — Z7901 Long term (current) use of anticoagulants: Secondary | ICD-10-CM | POA: Diagnosis not present

## 2016-12-16 LAB — URINALYSIS, COMPLETE (UACMP) WITH MICROSCOPIC
BACTERIA UA: NONE SEEN
BILIRUBIN URINE: NEGATIVE
GLUCOSE, UA: NEGATIVE mg/dL
HGB URINE DIPSTICK: NEGATIVE
KETONES UR: NEGATIVE mg/dL
NITRITE: NEGATIVE
PH: 5 (ref 5.0–8.0)
PROTEIN: NEGATIVE mg/dL
Specific Gravity, Urine: 1.01 (ref 1.005–1.030)

## 2016-12-16 LAB — TYPE AND SCREEN
ABO/RH(D): O POS
Antibody Screen: NEGATIVE

## 2016-12-16 LAB — COMPREHENSIVE METABOLIC PANEL
ALBUMIN: 3 g/dL — AB (ref 3.5–5.0)
AST: 20 U/L (ref 15–41)
Alkaline Phosphatase: 66 U/L (ref 38–126)
Anion gap: 6 (ref 5–15)
BILIRUBIN TOTAL: 0.4 mg/dL (ref 0.3–1.2)
BUN: 13 mg/dL (ref 6–20)
CO2: 25 mmol/L (ref 22–32)
CREATININE: 0.66 mg/dL (ref 0.44–1.00)
Calcium: 8.6 mg/dL — ABNORMAL LOW (ref 8.9–10.3)
Chloride: 107 mmol/L (ref 101–111)
GFR calc Af Amer: >60 mL/min (ref >60)
GLUCOSE: 95 mg/dL (ref 65–99)
POTASSIUM: 4.4 mmol/L (ref 3.5–5.1)
Sodium: 138 mmol/L (ref 135–145)
TOTAL PROTEIN: 6.9 g/dL (ref 6.5–8.1)

## 2016-12-16 LAB — CBC
HEMATOCRIT: 47.2 % — AB (ref 35.0–47.0)
Hemoglobin: 15 g/dL (ref 12.0–16.0)
MCH: 27.9 pg (ref 26.0–34.0)
MCHC: 31.7 g/dL — AB (ref 32.0–36.0)
MCV: 87.9 fL (ref 80.0–100.0)
PLATELETS: 1127 10*3/uL — AB (ref 150–440)
RBC: 5.37 MIL/uL — ABNORMAL HIGH (ref 3.80–5.20)
RDW: 31.6 % — AB (ref 11.5–14.5)
WBC: 10.1 10*3/uL (ref 3.6–11.0)

## 2016-12-16 LAB — PROTIME-INR
INR: 3.96
Prothrombin Time: 39.7 seconds — ABNORMAL HIGH (ref 11.4–15.2)

## 2016-12-16 MED ORDER — CEFTRIAXONE SODIUM 1 G IJ SOLR: 1.0000 g | Freq: Once | INTRAMUSCULAR | Status: DC

## 2016-12-16 MED ORDER — CEFDINIR 300 MG PO CAPS: 300.0000 mg | ORAL_CAPSULE | Freq: Two times a day (BID) | ORAL | 0 refills | Status: DC

## 2016-12-16 MED ORDER — CEFTRIAXONE SODIUM-DEXTROSE 1-3.74 GM-% IV SOLR
1.0000 g | Freq: Once | INTRAVENOUS | Status: AC
  Administered 2016-12-16: 1 g via INTRAVENOUS
  Filled 2016-12-16: qty 50

## 2016-12-16 NOTE — ED Notes (Signed)
Pt in and out cath by this RN and [employer], [employer]. Pt tolerated well. Pt returned to [facility].

## 2016-12-16 NOTE — ED Notes (Signed)
Pt d/c to [facility] via EMS

## 2016-12-16 NOTE — ED Notes (Signed)
Pt lying on stretcher in hallway with no distress noted; pt voices good understanding of plan of care; pt denies any c/o at present; IV antibiotics hung to infuse per MD orders after verification of med allergies with chart & pt

## 2016-12-16 NOTE — Telephone Encounter (Signed)
[name] called stating the Yolanda Wells colostomy bag is gull of blood. I advised her to take Nykeria to the Emergency Room. Patient understood

## 2016-12-16 NOTE — ED Provider Notes (Addendum)
[facility] Emergency Department Provider Note  ____________________________________________   First MD Initiated Contact with Patient 12/16/16 1505     (approximate)  I have reviewed the triage vital signs and the nursing notes.   HISTORY  Chief Complaint Blood In Stools   HPI Yolanda Wells is a 83 y.o. female with a history of DVT on Coumadin as well as with colostomy who is presenting with 1 episode of blood to the colostomy this morning when she woke at 7 AM. She says that she does not have any pain. No nausea or vomiting. She says that her stool is now in normal, brown/brick-color.  She says that since changing the colostomy this morning there is been no further bleeding.She does not actually remember drinking or eating anything blood or red color.   Past Medical History:  Diagnosis Date  . Arthritis   . Collagen vascular disease ([place])   . Deep venous thrombosis (HCC)    right lower extremity  . Dependent edema   . History of bilateral hip replacements   . History of hysterectomy   . Hypertension   . Polycythemia vera ([place])   . Stroke [facility])    Pt reports she was told she had a stroke in may    Patient Active Problem List   Diagnosis Date Noted  . Influenza with respiratory manifestation 12/01/2016  . Respiratory distress 11/23/2016  . Vision loss of right eye 08/26/2016  . Blindness of right eye 08/26/2016  . Cannot walk 10/12/2015  . Presence of colostomy ([place]) 10/04/2015  . Long term current use of anticoagulant 10/03/2015  . Pulmonary embolism ([place]) 10/03/2015  . Leukocytosis 09/25/2015  . Chest pain 09/25/2015  . COPD exacerbation ([place]) 09/24/2015  . Right lower lobe pneumonia ([place]) 09/24/2015  . Chronic obstructive pulmonary disease with acute exacerbation ([place]) 09/24/2015  . Dyspnea 09/22/2015  . Elevated troponin 09/15/2015  . UTI (lower urinary tract infection) 07/16/2015  . Abdominal wall cellulitis 07/15/2015  . DVT  (deep venous thrombosis) ([place]) 07/15/2015  . HTN (hypertension) 07/15/2015  . Polycythemia vera ([place]) 07/15/2015  . Dependent edema 07/15/2015  . Arthritis 07/15/2015  . Pulmonary emboli ([place]) 05/25/2015  . Diverticulosis of colon with hemorrhage 04/02/2015  . Diverticula of intestine 04/02/2015  . Dehydration, moderate 01/11/2015  . Fall 01/10/2015  . BP (high blood pressure) 01/10/2015  . Hammer toe 09/02/2012  . History of amputation of foot ([place]) 09/02/2012  . Fungal infection of nail 09/02/2012  . Disease of skin and subcutaneous tissue 09/02/2012  . Pain in toe 09/02/2012  . Arthralgia of hip 08/23/2012  . History of repair of hip joint 08/23/2012  . Glenohumeral arthritis 06/28/2012    Past Surgical History:  Procedure Laterality Date  . ABDOMINAL HYSTERECTOMY    . CARDIAC CATHETERIZATION Right 04/03/2015   Procedure: CENTRAL LINE INSERTION;  Surgeon: Sherri Rad, MD;  Location: [facility] ORS;  Service: General;  Laterality: Right;  . COLECTOMY WITH COLOSTOMY CREATION/HARTMANN PROCEDURE N/A 04/03/2015   Procedure: COLECTOMY WITH COLOSTOMY CREATION/HARTMANN PROCEDURE;  Surgeon: Sherri Rad, MD;  Location: [facility] ORS;  Service: General;  Laterality: N/A;  . JOINT REPLACEMENT     Left and Right Hip  . PERIPHERAL VASCULAR CATHETERIZATION N/A 04/02/2015   Procedure: Visceral Angiography;  Surgeon: Algernon Huxley, MD;  Location: [place] CV LAB;  Service: Cardiovascular;  Laterality: N/A;  . PERIPHERAL VASCULAR CATHETERIZATION N/A 04/02/2015   Procedure: Visceral Artery Intervention;  Surgeon: Algernon Huxley, MD;  Location: [facility]  INVASIVE CV LAB;  Service: Cardiovascular;  Laterality: N/A;  . PERIPHERAL VASCULAR CATHETERIZATION N/A 05/25/2015   Procedure: IVC Filter Insertion;  Surgeon: Katha Cabal, MD;  Location: [place] CV LAB;  Service: Cardiovascular;  Laterality: N/A;  . TOE AMPUTATION     right    Prior to Admission medications   Medication Sig Start Date End Date Taking?  Authorizing Provider  aspirin EC 81 MG tablet Take 1 tablet (81 mg total) by mouth daily. 09/16/15   [name], MD  docusate sodium (COLACE) 100 MG capsule Take 100 mg by mouth 2 (two) times daily as needed for mild constipation.     Historical Provider, MD  folic acid (FOLVITE) A999333 MCG tablet Take 400 mcg by mouth daily.    Historical Provider, MD  hydroxyurea (HYDREA) 500 MG capsule Pt takes 4 capsules on Wed, Saturday, and Sunday,take 3 capsules on Monday, Tues Thurs, and Friday. Patient taking differently: 2,000 mg daily.  09/16/16   Lequita Asal, MD  ipratropium-albuterol (DUONEB) 0.5-2.5 (3) MG/3ML SOLN Take 3 mLs by nebulization every 4 (four) hours. Patient not taking: Reported on 11/25/2016 09/24/15   Theodoro Grist, MD  ipratropium-albuterol (DUONEB) 0.5-2.5 (3) MG/3ML SOLN Take 3 mLs by nebulization every 6 (six) hours as needed. 11/29/16   Epifanio Lesches, MD  menthol-cetylpyridinium (CEPACOL) 3 MG lozenge Take 1 lozenge (3 mg total) by mouth as needed for sore throat. Patient not taking: Reported on 11/25/2016 09/25/15   Theodoro Grist, MD  metoprolol tartrate (LOPRESSOR) 25 MG tablet Take 12.5 mg by mouth 2 (two) times daily.    Historical Provider, MD  Polyethylene Glycol POWD Take 17 g by mouth daily. Patient not taking: Reported on 11/25/2016 11/22/15   Clayburn Pert, MD  potassium chloride SA (K-DUR,KLOR-CON) 20 MEQ tablet Take 1 tablet (20 mEq total) by mouth 2 (two) times daily. 07/20/15   Vaughan Basta, MD  warfarin (COUMADIN) 4 MG tablet Take 4 mg by mouth daily.    Historical Provider, MD    Allergies Patient has no known allergies.  Family History  Problem Relation Age of Onset  . Heart attack Father   . COPD Father   . Hypertension    . Arthritis-Osteo    . Diabetes Brother   . Osteosarcoma Son     Social History Social History  Substance Use Topics  . Smoking status: Never Smoker  . Smokeless tobacco: Never Used  . Alcohol use No     Review of Systems Constitutional: No fever/chills Eyes: No visual changes. ENT: No sore throat. Cardiovascular: Denies chest pain. Respiratory: Denies shortness of breath. Gastrointestinal: No abdominal pain.  No nausea, no vomiting.  No diarrhea.  No constipation. Genitourinary: Negative for dysuria. Musculoskeletal: Negative for back pain. Skin: Negative for rash. Neurological: Negative for headaches, focal weakness or numbness.  10-point ROS otherwise negative.  ____________________________________________   PHYSICAL EXAM:  VITAL SIGNS: ED Triage Vitals  Enc Vitals Group     BP 12/16/16 1331 120/66     Pulse Rate 12/16/16 1331 65     Resp 12/16/16 1331 16     Temp 12/16/16 1331 98.1 F (36.7 C)     Temp Source 12/16/16 1331 Oral     SpO2 12/16/16 1331 95 %     Weight 12/16/16 1331 205 lb (93 kg)     Height 12/16/16 1331 5\' 7"  (1.702 m)     Head Circumference --      Peak Flow --  Pain Score 12/16/16 1409 0     Pain Loc --      Pain Edu? --      Excl. in [place]? --     Constitutional: Alert and oriented. Well appearing and in no acute distress. Eyes: Conjunctivae are normal. PERRL. EOMI. Head: Atraumatic. Nose: No congestion/rhinnorhea. Mouth/Throat: Mucous membranes are moist.   Neck: No stridor.   Cardiovascular: Normal rate, regular rhythm. Grossly normal heart sounds.   Respiratory: Normal respiratory effort.  No retractions. Lungs CTAB. Gastrointestinal: Soft and nontender. No distention. Colostomy with brown stool. Musculoskeletal: No lower extremity tenderness nor edema.  No joint effusions. Neurologic:  Normal speech and language. No gross focal neurologic deficits are appreciated.  Skin:  Skin is warm, dry and intact. No rash noted. Psychiatric: Mood and affect are normal. Speech and behavior are normal.  ____________________________________________   LABS (all labs ordered are listed, but only abnormal results are displayed)  Labs Reviewed   COMPREHENSIVE METABOLIC PANEL - Abnormal; Notable for the following:       Result Value   Calcium 8.6 (*)    Albumin 3.0 (*)    ALT <5 (*)    All other components within normal limits  CBC - Abnormal; Notable for the following:    RBC 5.37 (*)    HCT 47.2 (*)    MCHC 31.7 (*)    RDW 31.6 (*)    Platelets 1,127 (*)    All other components within normal limits  PROTIME-INR - Abnormal; Notable for the following:    Prothrombin Time 39.7 (*)    All other components within normal limits  URINE CULTURE  URINALYSIS, COMPLETE (UACMP) WITH MICROSCOPIC  POC OCCULT BLOOD, ED  TYPE AND SCREEN   ____________________________________________  EKG   ____________________________________________  RADIOLOGY   ____________________________________________   PROCEDURES  Procedure(s) performed:   Procedures  Critical Care performed:   ____________________________________________   INITIAL IMPRESSION / ASSESSMENT AND PLAN / ED COURSE  Pertinent labs & imaging results that were available during my care of the patient were reviewed by me and considered in my medical decision making (see chart for details).  ----------------------------------------- 6:22 PM on 12/16/2016 -----------------------------------------  Patient still with brown stool in her colostomy bag. Reassuring hemoglobin. I discussed with Dr.Roa of hematology regarding the patient's elevated platelets and she says that she will be passing on the information to Dr. Mike Gip who will be following up with the patient. Because the patient is otherwise asymptomatic, without any pain, Dr. Janese Banks does not recommend any acute intervention at this time. I also discussed the case with the patient's granddaughter, Yolanda Wells, who is highly involved in the patient's care. We discussed holding the patient's Coumadin dose tonight and then resuming tomorrow. The patient has a goal INR of between 2 and 3. There is no bleeding at this time. I  feel that holding the single dose of Coumadin this evening will be sufficient because there are no further symptoms. Furthermore, the granddaughter says that the patient is usually when the hematocrit is greater than 42. The hematocrit is slightly greater than 47 at this time. The granddaughter says that she'll be discussing this further with hematology. Furthermore, granddaughter says that the patient has been experiencing burning with urination. I discussed this further with the patient confirms this. We will be testing her urine and treating appropriately if the urine appears infected.       ____________________________________________   FINAL CLINICAL IMPRESSION(S) / ED DIAGNOSES  Coumadin coagulopathy. GI bleed.  Dysuria.    NEW MEDICATIONS STARTED DURING THIS VISIT:  New Prescriptions   No medications on file     Note:  This document was prepared using Dragon voice recognition software and may include unintentional dictation errors.    Orbie Pyo, MD 12/16/16 1824  Patient with evidence of UTI on urinalysis. We will treat with Cefdinir.     Orbie Pyo, MD 12/16/16 1902    Orbie Pyo, MD 12/16/16 Lurline Hare

## 2016-12-16 NOTE — ED Notes (Signed)
Called & spoke with Lattie Haw at [facility], report given and reports pt is nonambulatory; called & spoke with pt's granddaughter, [name] who voices understanding of d/c, f/u and rx; granddaughter also reports pt is nonambulatory & will need EMS transport back to facility; EMS called for transport

## 2016-12-16 NOTE — ED Notes (Signed)
Pt given spirte and peanut butter with crackers at request

## 2016-12-16 NOTE — ED Triage Notes (Signed)
Pt reports episode of blood in colostomy this morning. Does not think any since.  Stool in colostomy is brick colored now.  Denies pain or fevers. Speech appears slurred but baseline per pt

## 2016-12-17 ENCOUNTER — Telehealth: Payer: Self-pay | Admitting: *Deleted

## 2016-12-17 NOTE — Telephone Encounter (Signed)
Called [name] the granddaughter to verify med dosing, pt currently residing at [facility]. Spoke with angela the nurse and she reports that the patient is taking hydroxyurea 500 mg tablets: 4 tabs on Sun, Wed, and Sat and 3 tabs on Mon, Tues, Thurs and Fri.  Pateint currently on coumadin 4 mg daily, facility informed of patients appt tomorrow 2-8- with MD and for phlebotomy and the importance of keeping the appt. Facility stated that they would try to get her to that appointment, voiced understanding.

## 2016-12-18 ENCOUNTER — Inpatient Hospital Stay: Payer: No Typology Code available for payment source | Attending: Hematology and Oncology

## 2016-12-18 ENCOUNTER — Inpatient Hospital Stay: Payer: No Typology Code available for payment source

## 2016-12-18 ENCOUNTER — Telehealth: Payer: Self-pay | Admitting: Emergency Medicine

## 2016-12-18 ENCOUNTER — Inpatient Hospital Stay (HOSPITAL_BASED_OUTPATIENT_CLINIC_OR_DEPARTMENT_OTHER): Payer: No Typology Code available for payment source | Admitting: Hematology and Oncology

## 2016-12-18 VITALS — BP 133/83 | HR 78 | Temp 96.8°F | Resp 18

## 2016-12-18 DIAGNOSIS — Z7982 Long term (current) use of aspirin: Secondary | ICD-10-CM

## 2016-12-18 DIAGNOSIS — K921 Melena: Secondary | ICD-10-CM | POA: Diagnosis not present

## 2016-12-18 DIAGNOSIS — Z96643 Presence of artificial hip joint, bilateral: Secondary | ICD-10-CM | POA: Insufficient documentation

## 2016-12-18 DIAGNOSIS — Z8719 Personal history of other diseases of the digestive system: Secondary | ICD-10-CM | POA: Insufficient documentation

## 2016-12-18 DIAGNOSIS — D45 Polycythemia vera: Secondary | ICD-10-CM | POA: Diagnosis not present

## 2016-12-18 DIAGNOSIS — H02009 Unspecified entropion of unspecified eye, unspecified eyelid: Secondary | ICD-10-CM | POA: Diagnosis not present

## 2016-12-18 DIAGNOSIS — Z86711 Personal history of pulmonary embolism: Secondary | ICD-10-CM

## 2016-12-18 DIAGNOSIS — Z79899 Other long term (current) drug therapy: Secondary | ICD-10-CM

## 2016-12-18 DIAGNOSIS — Z8673 Personal history of transient ischemic attack (TIA), and cerebral infarction without residual deficits: Secondary | ICD-10-CM

## 2016-12-18 DIAGNOSIS — I2782 Chronic pulmonary embolism: Secondary | ICD-10-CM

## 2016-12-18 DIAGNOSIS — Z86718 Personal history of other venous thrombosis and embolism: Secondary | ICD-10-CM

## 2016-12-18 DIAGNOSIS — R600 Localized edema: Secondary | ICD-10-CM | POA: Insufficient documentation

## 2016-12-18 DIAGNOSIS — M1990 Unspecified osteoarthritis, unspecified site: Secondary | ICD-10-CM

## 2016-12-18 DIAGNOSIS — Z933 Colostomy status: Secondary | ICD-10-CM | POA: Insufficient documentation

## 2016-12-18 DIAGNOSIS — R5383 Other fatigue: Secondary | ICD-10-CM | POA: Insufficient documentation

## 2016-12-18 DIAGNOSIS — I82509 Chronic embolism and thrombosis of unspecified deep veins of unspecified lower extremity: Secondary | ICD-10-CM

## 2016-12-18 DIAGNOSIS — Z90710 Acquired absence of both cervix and uterus: Secondary | ICD-10-CM | POA: Insufficient documentation

## 2016-12-18 DIAGNOSIS — Z808 Family history of malignant neoplasm of other organs or systems: Secondary | ICD-10-CM | POA: Insufficient documentation

## 2016-12-18 DIAGNOSIS — I10 Essential (primary) hypertension: Secondary | ICD-10-CM

## 2016-12-18 DIAGNOSIS — R791 Abnormal coagulation profile: Secondary | ICD-10-CM | POA: Insufficient documentation

## 2016-12-18 DIAGNOSIS — Z7901 Long term (current) use of anticoagulants: Secondary | ICD-10-CM | POA: Insufficient documentation

## 2016-12-18 LAB — PROTIME-INR
INR: 3.04
Prothrombin Time: 32.1 seconds — ABNORMAL HIGH (ref 11.4–15.2)

## 2016-12-18 NOTE — Telephone Encounter (Signed)
Yolanda Wells called and states that [facility] staff told her that no antibiotic was ordered for uti during 2/6 ED visit.  After chart review I can see the patient should be receiving cefdinir for 10 days.  I called [facility], and they said they have the order now and the medication is there.

## 2016-12-18 NOTE — Progress Notes (Signed)
Patient c/o fatigue. 

## 2016-12-18 NOTE — Progress Notes (Signed)
[facility] day:  12/18/2016   Chief Complaint: Yolanda Wells is a 83 y.o. female with polycythemia rubra vera (PV) who is seen for 1 month assessment on hydroxyurea and Coumadin and possible phlebotomy.  HPI:   The patient was last seen in the medical oncology clinic on 11/06/2016.  At that time, she noted some loose stool.  She was sleeping poorly.  CBC revealed a hematocrit of 41.7, hemoglobin 14.1, MCV 87.8, platelets 942,000, WBC 3200 with an ANC of 2200.  INR was 4.39.    She was taking hydroxyurea 28 pills/week (4 capsules/day).  As her platelet count was improving from the week prior (1.057 million) when an adjustment was made in her hydroxyurea, she remained at the same dose.  She was taking Coumadin 4 mg 3 days/week and 5 mg 4 days/week (total weekly dose 32 mg).  Coumadin was held x 1 day then restarted at 4 mg 6 days/week and 5 mg 1 day/week (total weekly dose 29 mg).  Platelet count improved to 785,000 on 11/24/2016.  She was admitted at [facility] with influenza A from 11/22/2016 - 12/01/2016.  She was seen in the ER at [facility] on 12/16/2016 with blood in the stool.  CBC revealed a hematocrit of 47.2, hemoglobin 15.0, platelets 1.127 million, and WBC 10,100.  CMP revealed a creatinine of 0.66 with normal liver function tests.  INR was 3.96.  Urinalysis revealed TNTC WBCs.  Coumadin was held x 1 day.  Report from the care facility revealed that she is taking hydroxyurea 500 mg tablets: 4 tabs on Sun, Wed, and Sat and 3 tabletss on Mon, Tues, Thurs and Fri (total 24 pills/week).  She is on Coumadin 4 mg daily (total weekly dose 28 mg).  She has had no further blood in her stool.  She has not been on antibiotics ar had any diarrhea.  Her diet is the same.   Past Medical History:  Diagnosis Date  . Arthritis   . Collagen vascular disease ([place])   . Deep venous thrombosis (H[facility])    right lower extremity  . Dependent edema   . History of bilateral  hip replacements   . History of hysterectomy   . Hypertension   . Polycythemia vera ([place])   . Stroke [facility])    Pt reports she was told she had a stroke in may    Past Surgical History:  Procedure Laterality Date  . ABDOMINAL HYSTERECTOMY    . CARDIAC CATHETERIZATION Right 04/03/2015   Procedure: CENTRAL LINE INSERTION;  Surgeon: Sherri Rad, MD;  Location: [facility] ORS;  Service: General;  Laterality: Right;  . COLECTOMY WITH COLOSTOMY CREATION/HARTMANN PROCEDURE N/A 04/03/2015   Procedure: COLECTOMY WITH COLOSTOMY CREATION/HARTMANN PROCEDURE;  Surgeon: Sherri Rad, MD;  Location: [facility] ORS;  Service: General;  Laterality: N/A;  . JOINT REPLACEMENT     Left and Right Hip  . PERIPHERAL VASCULAR CATHETERIZATION N/A 04/02/2015   Procedure: Visceral Angiography;  Surgeon: Algernon Huxley, MD;  Location: [place] CV LAB;  Service: Cardiovascular;  Laterality: N/A;  . PERIPHERAL VASCULAR CATHETERIZATION N/A 04/02/2015   Procedure: Visceral Artery Intervention;  Surgeon: Algernon Huxley, MD;  Location: [place] [facility] LAB;  Service: Cardiovascular;  Laterality: N/A;  . PERIPHERAL VASCULAR CATHETERIZATION N/A 05/25/2015   Procedure: IVC Filter Insertion;  Surgeon: Katha Cabal, MD;  Location: [place] [facility] LAB;  Service: Cardiovascular;  Laterality: N/A;  . TOE AMPUTATION     right  Family History  Problem Relation Age of Onset  . Heart attack Father   . COPD Father   . Hypertension    . Arthritis-Osteo    . Diabetes Brother   . Osteosarcoma Son     Social History:  reports that she has never smoked. She has never used smokeless tobacco. She reports that she does not drink alcohol or use drugs.  She lives alone on [address] at [facility] (independent living).  She is staying at [facility].  Her grand-daughter visits regularly [name] [phone]).  The patient is accompanied by her grand-daughter today.  Allergies: No Known Allergies  Current Medications: Current Outpatient  Prescriptions  Medication Sig Dispense Refill  . aspirin EC 81 MG tablet Take 1 tablet (81 mg total) by mouth daily. 30 tablet 2  . cefdinir (OMNICEF) 300 MG capsule Take 1 capsule (300 mg total) by mouth 2 (two) times daily. 20 capsule 0  . docusate sodium (COLACE) 100 MG capsule Take 100 mg by mouth 2 (two) times daily as needed for mild constipation.     . folic acid (FOLVITE) 591 MCG tablet Take 400 mcg by mouth daily.    . hydroxyurea (HYDREA) 500 MG capsule Pt takes 4 capsules on Wed, Saturday, and Sunday,take 3 capsules on Monday, Tues Thurs, and Friday. (Patient taking differently: 2,000 mg daily. ) 24 capsule 0  . ipratropium-albuterol (DUONEB) 0.5-2.5 (3) MG/3ML SOLN Take 3 mLs by nebulization every 4 (four) hours. 360 mL 6  . ipratropium-albuterol (DUONEB) 0.5-2.5 (3) MG/3ML SOLN Take 3 mLs by nebulization every 6 (six) hours as needed. 3 mL 30  . metoprolol tartrate (LOPRESSOR) 25 MG tablet Take 12.5 mg by mouth 2 (two) times daily.    . Polyethylene Glycol POWD Take 17 g by mouth daily. 527 g 5  . potassium chloride SA (K-DUR,KLOR-CON) 20 MEQ tablet Take 1 tablet (20 mEq total) by mouth 2 (two) times daily. 30 tablet 0  . warfarin (COUMADIN) 4 MG tablet Take 4 mg by mouth daily.    [name] menthol-cetylpyridinium (CEPACOL) 3 MG lozenge Take 1 lozenge (3 mg total) by mouth as needed for sore throat. (Patient not taking: Reported on 11/25/2016) 100 tablet 12   No current facility-administered medications for this visit.     Review of Systems:  GENERAL:  Feels "ok".  No fevers or sweats.  No new weight. PERFORMANCE STATUS (ECOG):  3 HEENT:  Decreased vision in right eye s/p embolic event.  No runny nose, sore throat, mouth sores or tenderness. Lungs:  No shortness of breath or cough.  No hemoptysis. Cardiac:  No chest pain, palpitations, orthopnea, or PND. GI:  No nausea, vomiting, diarrhea, constipation, melena or hematochezia.  Interval blood in stool, resolved. GU:  No urgency,  frequency, dysuria, or hematuria.  Wears adult diapers. Musculoskeletal:  Knee problems ("bone on bone").  No muscle tenderness. Extremities:  Chronic swelling in legs. Skin:  Dry skin.  No rashes or skin changes. Neuro:  No headache, numbness or weakness, balance or coordination issues. Endocrine:  No diabetes, thyroid issues, hot flashes or night sweats. Psych:  No mood changes, depression or anxiety. Pain:  No focal pain. Review of systems:  All other systems reviewed and found to be negative.  Physical Exam: Blood pressure 133/83, pulse 78, temperature (!) 96.8 F (36 C), temperature source Tympanic, resp. rate 18. GENERAL:  Elderly woman sitting comfortably in a wheelchair in the exam room in no acute distress. MENTAL STATUS:  Alert and  oriented to person, place and time. HEAD: Pearline Cables hair.  Normocephalic, atraumatic, face symmetric, no Cushingoid features. EYES:  Brown eyes.  Right sided entropion.  Pupils equal round and reactive to light and accomodation.  No conjunctivitis or scleral icterus. ENT:  Oropharynx clear without lesion.  Upper dentures.  Tongue normal. Mucous membranes moist.  RESPIRATORY:  Clear to auscultation without rales, wheezes or rhonchi. CARDIOVASCULAR:  Regular rate and rhythm without murmur, rub or gallop. ABDOMEN:  Left sided colostomy.  Stool brown.  Soft, non-tender, with active bowel sounds, and no appreciable hepatosplenomegaly.  No masses. SKIN:  No ulcers or lesions. EXTREMITIES:  Chronic lower extremity changes.  No skin discoloration or tenderness.  No palpable cords. LYMPH NODES: No palpable cervical, supraclavicular, axillary or inguinal adenopathy  NEUROLOGICAL: Unremarkable. PSYCH:  Appropriate.   Appointment on 12/18/2016  Component Date Value Ref Range Status  . WBC 12/18/2016 6.2  3.6 - 11.0 K/uL Final  . RBC 12/18/2016 4.80  3.80 - 5.20 MIL/uL Final  . Hemoglobin 12/18/2016 13.6  12.0 - 16.0 g/dL Final  . HCT 12/18/2016 41.3  35.0 - 47.0  % Final  . MCV 12/18/2016 86.1  80.0 - 100.0 fL Final  . MCH 12/18/2016 28.4  26.0 - 34.0 pg Final  . MCHC 12/18/2016 33.0  32.0 - 36.0 g/dL Final  . RDW 12/18/2016 30.2* 11.5 - 14.5 % Final  . Platelets 12/18/2016 PENDING  150 - 400 K/uL Incomplete  . Neutrophils Relative % 12/18/2016 85  % Final  . Neutro Abs 12/18/2016 5.3  1.4 - 6.5 K/uL Final  . Lymphocytes Relative 12/18/2016 12  % Final  . Lymphs Abs 12/18/2016 0.8* 1.0 - 3.6 K/uL Final  . Monocytes Relative 12/18/2016 0  % Final  . Monocytes Absolute 12/18/2016 0.0* 0.2 - 0.9 K/uL Final  . Eosinophils Relative 12/18/2016 2  % Final  . Eosinophils Absolute 12/18/2016 0.1  0 - 0.7 K/uL Final  . Basophils Relative 12/18/2016 1  % Final  . Basophils Absolute 12/18/2016 0.1  0 - 0.1 K/uL Final  Admission on 12/16/2016, Discharged on 12/16/2016  Component Date Value Ref Range Status  . Sodium 12/16/2016 138  135 - 145 mmol/L Final  . Potassium 12/16/2016 4.4  3.5 - 5.1 mmol/L Final  . Chloride 12/16/2016 107  101 - 111 mmol/L Final  . CO2 12/16/2016 25  22 - 32 mmol/L Final  . Glucose, Bld 12/16/2016 95  65 - 99 mg/dL Final  . BUN 12/16/2016 13  6 - 20 mg/dL Final  . Creatinine, Ser 12/16/2016 0.66  0.44 - 1.00 mg/dL Final  . Calcium 12/16/2016 8.6* 8.9 - 10.3 mg/dL Final  . Total Protein 12/16/2016 6.9  6.5 - 8.1 g/dL Final  . Albumin 12/16/2016 3.0* 3.5 - 5.0 g/dL Final  . AST 12/16/2016 20  15 - 41 U/L Final  . ALT 12/16/2016 <5* 14 - 54 U/L Final  . Alkaline Phosphatase 12/16/2016 66  38 - 126 U/L Final  . Total Bilirubin 12/16/2016 0.4  0.3 - 1.2 mg/dL Final  . GFR calc non Af Amer 12/16/2016 >60  >60 mL/min Final  . GFR calc Af Amer 12/16/2016 >60  >60 mL/min Final   Comment: (NOTE) The eGFR has been calculated using the CKD EPI equation. This calculation has not been validated in all clinical situations. eGFR's persistently <60 mL/min signify possible Chronic Kidney Disease.   . Anion gap 12/16/2016 6  5 - 15 Final   . WBC 12/16/2016 10.1  3.6 - 11.0 K/uL Final  . RBC 12/16/2016 5.37* 3.80 - 5.20 MIL/uL Final  . Hemoglobin 12/16/2016 15.0  12.0 - 16.0 g/dL Final  . HCT 96/79/4738 47.2* 35.0 - 47.0 % Final  . MCV 12/16/2016 87.9  80.0 - 100.0 fL Final  . MCH 12/16/2016 27.9  26.0 - 34.0 pg Final  . MCHC 12/16/2016 31.7* 32.0 - 36.0 g/dL Final  . RDW 47/97/5678 31.6* 11.5 - 14.5 % Final  . Platelets 12/16/2016 1127* 150 - 440 K/uL Final   Comment: LARGE PLATELETS PRESENT CRITICAL RESULT CALLED TO, READ BACK BY AND VERIFIED WITH: KATIE NEWSHOLME 12/16/16 @ 1512  MLK   . ABO/RH(D) 12/16/2016 O POS   Final  . Antibody Screen 12/16/2016 NEG   Final  . Sample Expiration 12/16/2016 12/19/2016   Final  . Prothrombin Time 12/16/2016 39.7* 11.4 - 15.2 seconds Final  . INR 12/16/2016 3.96   Final  . Color, Urine 12/16/2016 YELLOW* YELLOW Final  . APPearance 12/16/2016 HAZY* CLEAR Final  . Specific Gravity, Urine 12/16/2016 1.010  1.005 - 1.030 Final  . pH 12/16/2016 5.0  5.0 - 8.0 Final  . Glucose, UA 12/16/2016 NEGATIVE  NEGATIVE mg/dL Final  . Hgb urine dipstick 12/16/2016 NEGATIVE  NEGATIVE Final  . Bilirubin Urine 12/16/2016 NEGATIVE  NEGATIVE Final  . Ketones, ur 12/16/2016 NEGATIVE  NEGATIVE mg/dL Final  . Protein, ur 70/53/4954 NEGATIVE  NEGATIVE mg/dL Final  . Nitrite 91/75/8834 NEGATIVE  NEGATIVE Final  . Leukocytes, UA 12/16/2016 MODERATE* NEGATIVE Final  . RBC / HPF 12/16/2016 0-5  0 - 5 RBC/hpf Final  . WBC, UA 12/16/2016 TOO NUMEROUS TO COUNT  0 - 5 WBC/hpf Final  . Bacteria, UA 12/16/2016 NONE SEEN  NONE SEEN Final  . Squamous Epithelial / LPF 12/16/2016 0-5* NONE SEEN Final  . WBC Clumps 12/16/2016 PRESENT   Final  . Mucous 12/16/2016 PRESENT   Final  . Specimen Description 12/16/2016 URINE, RANDOM   Final  . Special Requests 12/16/2016 NONE   Final  . Culture 12/16/2016 20,000 COLONIES/mL PSEUDOMONAS AERUGINOSA*  Final  . Report Status 12/16/2016 PENDING   Incomplete    Assessment:   Yolanda Wells is a 83 y.o. female African-American woman with JAK2+ polycythemia rubra vera (PV) previously on a phlebotomy program and hydroxyurea. She received P32 in an attempt to manage her counts in 03/2015.   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 She was diagnosed with bilateral lower extremity DVTs on 05/18/2015 and bilateral pulmonary emboli on 05/24/2015. She underwent IVC filter placement on 05/25/2015.  She has been on a fluctuating dose of Coumadin secondary to unstable INR.  She is on a baby aspirin.  She developed progressive erythrocytosis, thrombocytosis, and leukocytosis.  She underwent phlebotomy for a hematocrit of 55.4 on 09/23/2015.  Hematocrit decreased to 50.0, but has again increased after initiation of oral iron.  Platelet count has increased from 1.1 million to 1.4 million.  White count increased from 23,000 - 28,000 to 31,800.  She was admitted on 08/25/2016 with loss of vision in her right eye.  CBC on 08/25/2016 revealed a hematocrit of 40.0, hemoglobin 13.5, MCV 91, platelets 842,000, WBC 4900.  INR was 2.59.  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  MRI and MRA showed no acute changes.  She has been back on hydroxyurea since 10/02/2015.  Initial dose was 1000 mg a day.  She is currently  taking 4 pills 3 days/week and 3 pills 4 days/week (total weekly dose: 24 pills).   She requires periodic phlebotomies (goal hematocrit <= 42).  Platelet count remains elevated (secondary to PV and likely some component of iron deficiency).  Goal platelet count is 400,000.  She is on Coumadin 4 mg a day (total weekly dose 28 mg).  INR goal 2-3.  Symptomatically, she notes recent blood in her stool (now resolved).  Exam reveals chronic right sided entropion.  Hematocrit is 41.3.  Platelet count is 1.085 million  WBC is 6,200 ([place] 5300).   INR is 3.04.  Plan: 1.  Labs today:  CBC with diff, PT/INR. 2.  Review events over last 6 weeks.  Discuss counts from emergency room and today's counts.  Hematocrit today under goal.  No phlebotomy today.  Discuss decreased dose of hydroxyurea that patient is taking as compared to clinic records.  Platelet count has subsequently increased.  Discuss return to prior hydroxyurea dosing (28 pills/week).  No further intervention unless platelet count increases or does not respond to adjustment.   3.  Discuss Coumadin dosing.  Coumadin held x 1 night post ER visit.  Continue current dose of Coumdin with recheck next week. 4.  No phlebotomy today. 5.  Increase hydrea to 4 pills/day. 6.  Guaiac all stools. 7.  Nurse to call center with INR and adjustment in Coumadin. 8.  RTC in 1 week for MD assess and labs (CBC with diff, PT/INR) and +/- phlebotomy.   Lequita Asal, MD  12/18/2016, 10:54 AM

## 2016-12-19 ENCOUNTER — Telehealth: Payer: Self-pay | Admitting: *Deleted

## 2016-12-19 LAB — URINE CULTURE: Culture: 20000 — AB

## 2016-12-19 LAB — CBC WITH DIFFERENTIAL/PLATELET
Basophils Absolute: 0.1 10*3/uL (ref 0–0.1)
Basophils Relative: 1 %
Eosinophils Absolute: 0.1 10*3/uL (ref 0–0.7)
Eosinophils Relative: 2 %
HCT: 41.3 % (ref 35.0–47.0)
Hemoglobin: 13.6 g/dL (ref 12.0–16.0)
Lymphocytes Relative: 12 %
Lymphs Abs: 0.8 10*3/uL — ABNORMAL LOW (ref 1.0–3.6)
MCH: 28.4 pg (ref 26.0–34.0)
MCHC: 33 g/dL (ref 32.0–36.0)
MCV: 86.1 fL (ref 80.0–100.0)
Monocytes Absolute: 0 10*3/uL — ABNORMAL LOW (ref 0.2–0.9)
Monocytes Relative: 0 %
Neutro Abs: 5.3 10*3/uL (ref 1.4–6.5)
Neutrophils Relative %: 85 %
Platelets: 1085 10*3/uL (ref 150–400)
RBC: 4.8 MIL/uL (ref 3.80–5.20)
RDW: 30.2 % — ABNORMAL HIGH (ref 11.5–14.5)
WBC: 6.2 10*3/uL (ref 3.6–11.0)

## 2016-12-19 NOTE — Telephone Encounter (Signed)
Levada Dy at [employer] notified that the patient should remain on coumadin 4 mg daily and guaic all stools and report to Dr. Mike Gip if stools were positive for blood. Voiced understanding.

## 2016-12-20 NOTE — Progress Notes (Signed)
ED Culture Results   Allergies: NKA Visit Date: 12/16/16 Chief Complaint:GI Bleed, dysuria  Culture Type: Urine  Culture Results:  20 K CFU Pseudomonas  Original Abx given: Cefdinir  Original Abx sensitive, intermediate, or resistant:resistant Recommended Abx  Spoke with Helene Kelp, nursing supervisor at [facility]. Fax information to [facility]. Helene Kelp to give information to MD.   Larene Beach, PharmD, BCPS Clinical Pharmacist

## 2016-12-25 ENCOUNTER — Inpatient Hospital Stay: Payer: No Typology Code available for payment source | Admitting: Hematology and Oncology

## 2016-12-25 ENCOUNTER — Inpatient Hospital Stay: Payer: No Typology Code available for payment source

## 2016-12-25 ENCOUNTER — Telehealth: Payer: Self-pay | Admitting: *Deleted

## 2016-12-25 ENCOUNTER — Encounter: Payer: Self-pay | Admitting: Hematology and Oncology

## 2016-12-25 NOTE — Progress Notes (Deleted)
[facility] day:  12/25/2016   Chief Complaint: Yolanda Wells is a 82 y.o. female with polycythemia rubra vera (PV) who is seen for 1 week assessment on hydroxyurea and Coumadin.  HPI:   The patient was last seen in the medical oncology clinic on 12/18/2016.  At that time, she had recently been in the [facility] ER with blood in her ostomy.  INR was 3.96.  Coumadin was held x 1 day.  She was on Caumadin 4 mg a day.  She noted no further bleeding.  INR was 3.04.  Platelet count was 1.085 million.  Her hydroxyurea was increased to 4 pills/day.  She had been taking 24 pills/week.  As her hematocrit was < 42, she did not have a phlebotomy.  During the interim,    Past Medical History:  Diagnosis Date  . Arthritis   . Collagen vascular disease ([place])   . Deep venous thrombosis ([facility])    right lower extremity  . Dependent edema   . History of bilateral hip replacements   . History of hysterectomy   . Hypertension   . Polycythemia vera ([place])   . Stroke [facility])    Pt reports she was told she had a stroke in may    Past Surgical History:  Procedure Laterality Date  . ABDOMINAL HYSTERECTOMY    . CARDIAC CATHETERIZATION Right 04/03/2015   Procedure: CENTRAL LINE INSERTION;  Surgeon: Sherri Rad, MD;  Location: [facility] ORS;  Service: General;  Laterality: Right;  . COLECTOMY WITH COLOSTOMY CREATION/HARTMANN PROCEDURE N/A 04/03/2015   Procedure: COLECTOMY WITH COLOSTOMY CREATION/HARTMANN PROCEDURE;  Surgeon: Sherri Rad, MD;  Location: [facility] ORS;  Service: General;  Laterality: N/A;  . JOINT REPLACEMENT     Left and Right Hip  . PERIPHERAL VASCULAR CATHETERIZATION N/A 04/02/2015   Procedure: Visceral Angiography;  Surgeon: Algernon Huxley, MD;  Location: [place] [facility];  Service: Cardiovascular;  Laterality: N/A;  . PERIPHERAL VASCULAR CATHETERIZATION N/A 04/02/2015   Procedure: Visceral Artery Intervention;  Surgeon: Algernon Huxley, MD;  Location: [place] [facility];  Service: Cardiovascular;  Laterality: N/A;  . PERIPHERAL VASCULAR CATHETERIZATION N/A 05/25/2015   Procedure: IVC Filter Insertion;  Surgeon: Katha Cabal, MD;  Location: [place] [facility];  Service: Cardiovascular;  Laterality: N/A;  . TOE AMPUTATION     right    Family History  Problem Relation Age of Onset  . Heart attack Father   . COPD Father   . Hypertension    . Arthritis-Osteo    . Diabetes Brother   . Osteosarcoma Son     Social History:  reports that she has never smoked. She has never used smokeless tobacco. She reports that she does not drink alcohol or use drugs.  She lives alone on [address] at [facility] (independent living).  She is staying at [facility].  Her grand-daughter visits regularly [name] [phone]).  The patient is accompanied by her grand-daughter today.  Allergies: No Known Allergies  Current Medications: Current Outpatient Prescriptions  Medication Sig Dispense Refill  . aspirin EC 81 MG tablet Take 1 tablet (81 mg total) by mouth daily. 30 tablet 2  . cefdinir (OMNICEF) 300 MG capsule Take 1 capsule (300 mg total) by mouth 2 (two) times daily. 20 capsule 0  . docusate sodium (COLACE) 100 MG capsule Take 100 mg by mouth 2 (two) times daily as needed for mild constipation.     . folic acid (  FOLVITE) 400 MCG tablet Take 400 mcg by mouth daily.    . hydroxyurea (HYDREA) 500 MG capsule Pt takes 4 capsules on Wed, Saturday, and Sunday,take 3 capsules on Monday, Tues Thurs, and Friday. (Patient taking differently: 2,000 mg daily. ) 24 capsule 0  . ipratropium-albuterol (DUONEB) 0.5-2.5 (3) MG/3ML SOLN Take 3 mLs by nebulization every 4 (four) hours. 360 mL 6  . ipratropium-albuterol (DUONEB) 0.5-2.5 (3) MG/3ML SOLN Take 3 mLs by nebulization every 6 (six) hours as needed. 3 mL 30  . menthol-cetylpyridinium (CEPACOL) 3 MG lozenge Take 1 lozenge (3 mg total) by mouth as needed for sore throat. (Patient not taking: Reported on  11/25/2016) 100 tablet 12  . metoprolol tartrate (LOPRESSOR) 25 MG tablet Take 12.5 mg by mouth 2 (two) times daily.    . Polyethylene Glycol POWD Take 17 g by mouth daily. 527 g 5  . potassium chloride SA (K-DUR,KLOR-CON) 20 MEQ tablet Take 1 tablet (20 mEq total) by mouth 2 (two) times daily. 30 tablet 0  . warfarin (COUMADIN) 4 MG tablet Take 4 mg by mouth daily.     No current facility-administered medications for this visit.     Review of Systems:  GENERAL:  Feels "ok".  No fevers or sweats.  No new weight. PERFORMANCE STATUS (ECOG):  3 HEENT:  Decreased vision in right eye s/p embolic event.  No runny nose, sore throat, mouth sores or tenderness. Lungs:  No shortness of breath or cough.  No hemoptysis. Cardiac:  No chest pain, palpitations, orthopnea, or PND. GI:  No nausea, vomiting, diarrhea, constipation, melena or hematochezia.  Interval blood in stool, resolved. GU:  No urgency, frequency, dysuria, or hematuria.  Wears adult diapers. Musculoskeletal:  Knee problems ("bone on bone").  No muscle tenderness. Extremities:  Chronic swelling in legs. Skin:  Dry skin.  No rashes or skin changes. Neuro:  No headache, numbness or weakness, balance or coordination issues. Endocrine:  No diabetes, thyroid issues, hot flashes or night sweats. Psych:  No mood changes, depression or anxiety. Pain:  No focal pain. Review of systems:  All other systems reviewed and found to be negative.  Physical Exam: There were no vitals taken for this visit. GENERAL:  Elderly woman sitting comfortably in a wheelchair in the exam room in no acute distress. MENTAL STATUS:  Alert and oriented to person, place and time. HEAD: Pearline Cables hair.  Normocephalic, atraumatic, face symmetric, no Cushingoid features. EYES:  Brown eyes.  Right sided entropion.  Pupils equal round and reactive to light and accomodation.  No conjunctivitis or scleral icterus. ENT:  Oropharynx clear without lesion.  Upper dentures.  Tongue  normal. Mucous membranes moist.  RESPIRATORY:  Clear to auscultation without rales, wheezes or rhonchi. CARDIOVASCULAR:  Regular rate and rhythm without murmur, rub or gallop. ABDOMEN:  Left sided colostomy.  Stool brown.  Soft, non-tender, with active bowel sounds, and no appreciable hepatosplenomegaly.  No masses. SKIN:  No ulcers or lesions. EXTREMITIES:  Chronic lower extremity changes.  No skin discoloration or tenderness.  No palpable cords. LYMPH NODES: No palpable cervical, supraclavicular, axillary or inguinal adenopathy  NEUROLOGICAL: Unremarkable. PSYCH:  Appropriate.   No visits with results within 3 Day(s) from this visit.  Latest known visit with results is:  Appointment on 12/18/2016  Component Date Value Ref Range Status  . WBC 12/18/2016 6.2  3.6 - 11.0 K/uL Final  . RBC 12/18/2016 4.80  3.80 - 5.20 MIL/uL Final  . Hemoglobin 12/18/2016 13.6  12.0 -  16.0 g/dL Final  . HCT 12/18/2016 41.3  35.0 - 47.0 % Final  . MCV 12/18/2016 86.1  80.0 - 100.0 fL Final  . MCH 12/18/2016 28.4  26.0 - 34.0 pg Final  . MCHC 12/18/2016 33.0  32.0 - 36.0 g/dL Final  . RDW 12/18/2016 30.2* 11.5 - 14.5 % Final  . Platelets 12/18/2016 1085* 150 - 400 K/uL Final   Comment: CALLED RESULT TO TRACIE @ 10:28 AM  12/18/2016 PWB RESULT REPEATED AND VERIFIED PLATELET COUNT CONFIRMED BY SMEAR   . Neutrophils Relative % 12/18/2016 85  % Final  . Neutro Abs 12/18/2016 5.3  1.4 - 6.5 K/uL Final  . Lymphocytes Relative 12/18/2016 12  % Final  . Lymphs Abs 12/18/2016 0.8* 1.0 - 3.6 K/uL Final  . Monocytes Relative 12/18/2016 0  % Final  . Monocytes Absolute 12/18/2016 0.0* 0.2 - 0.9 K/uL Final  . Eosinophils Relative 12/18/2016 2  % Final  . Eosinophils Absolute 12/18/2016 0.1  0 - 0.7 K/uL Final  . Basophils Relative 12/18/2016 1  % Final  . Basophils Absolute 12/18/2016 0.1  0 - 0.1 K/uL Final  . Prothrombin Time 12/18/2016 32.1* 11.4 - 15.2 seconds Final  . INR 12/18/2016 3.04   Final     Assessment:  Yolanda Wells is a 83 y.o. female African-American woman with JAK2+ polycythemia rubra vera (PV) previously on a phlebotomy program and hydroxyurea. She received P32 in an attempt to manage her counts in 03/2015.   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 She was diagnosed with bilateral lower extremity DVTs on 05/18/2015 and bilateral pulmonary emboli on 05/24/2015. She underwent IVC filter placement on 05/25/2015.  She has been on a fluctuating dose of Coumadin secondary to unstable INR.  She is on a baby aspirin.  She developed progressive erythrocytosis, thrombocytosis, and leukocytosis.  She underwent phlebotomy for a hematocrit of 55.4 on 09/23/2015.  Hematocrit decreased to 50.0, but has again increased after initiation of oral iron.  Platelet count has increased from 1.1 million to 1.4 million.  White count increased from 23,000 - 28,000 to 31,800.  She was admitted on 08/25/2016 with loss of vision in her right eye.  CBC on 08/25/2016 revealed a hematocrit of 40.0, hemoglobin 13.5, MCV 91, platelets 842,000, WBC 4900.  INR was 2.59.  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  MRI and MRA showed no acute changes.  She has been back on hydroxyurea since 10/02/2015.  Initial dose was 1000 mg a day.  She is currently taking 4 pills 3 days/week and 3 pills 4 days/week (total weekly dose: 24 pills).   She requires periodic phlebotomies (goal hematocrit <= 42).  Platelet count remains elevated (secondary to PV and likely some component of iron deficiency).  Goal platelet count is 400,000.  She is on Coumadin 4 mg a day (total weekly dose 28 mg).  INR goal 2-3.  Symptomatically, she notes recent blood in her stool (now resolved).  Exam reveals chronic right sided entropion.  Hematocrit is 41.3.  Platelet count is 1.085 million  WBC is  6,200 ([place] 5300).  INR is 3.04.  Plan: 1.  Labs today:  CBC with diff, PT/INR.  2.  Review events over last 6 weeks.  Discuss counts from emergency room and today's counts.  Hematocrit today under goal.  No phlebotomy today.  Discuss decreased dose of hydroxyurea that patient is taking as  compared to clinic records.  Platelet count has subsequently increased.  Discuss return to prior hydroxyurea dosing (28 pills/week).  No further intervention unless platelet count increases or does not respond to adjustment.   3.  Discuss Coumadin dosing.  Coumadin held x 1 night post ER visit.  Continue current dose of Coumdin with recheck next week. 4.  No phlebotomy today. 5.  Increase hydrea to 4 pills/day. 6.  Guaiac all stools. 7.  Nurse to call center with INR and adjustment in Coumadin. 8.  RTC in 1 week for MD assess and labs (CBC with diff, PT/INR) and +/- phlebotomy.   Lequita Asal, MD  12/25/2016, 3:45 AM

## 2016-12-25 NOTE — Telephone Encounter (Signed)
Per VO Dr Mike Gip, can stop checking stools. Call returned to [facility] with order to discontinue stool checks for blood, message left on voice mail

## 2016-12-25 NOTE — Telephone Encounter (Signed)
They have been checking all stools for blood as ordered since 12/19/16 and they are all negative. They want to know if they need to continue checking them. Please advise

## 2016-12-29 ENCOUNTER — Other Ambulatory Visit: Payer: Self-pay | Admitting: *Deleted

## 2016-12-29 ENCOUNTER — Telehealth: Payer: Self-pay | Admitting: *Deleted

## 2016-12-29 NOTE — Telephone Encounter (Signed)
Called [facility] and spoke with Claiborne Billings the rehab nurse and    Informed them of patient's appt this [facility] the [facility] at 1015.  Instructed that they needed to bring the patient to her appt, voiced understanding.

## 2017-01-02 ENCOUNTER — Inpatient Hospital Stay: Payer: No Typology Code available for payment source

## 2017-01-02 ENCOUNTER — Telehealth: Payer: Self-pay | Admitting: *Deleted

## 2017-01-02 ENCOUNTER — Inpatient Hospital Stay (HOSPITAL_BASED_OUTPATIENT_CLINIC_OR_DEPARTMENT_OTHER): Payer: No Typology Code available for payment source | Admitting: Hematology and Oncology

## 2017-01-02 VITALS — BP 124/81 | HR 60 | Temp 97.2°F | Resp 18

## 2017-01-02 DIAGNOSIS — Z8719 Personal history of other diseases of the digestive system: Secondary | ICD-10-CM

## 2017-01-02 DIAGNOSIS — Z90710 Acquired absence of both cervix and uterus: Secondary | ICD-10-CM

## 2017-01-02 DIAGNOSIS — R791 Abnormal coagulation profile: Secondary | ICD-10-CM

## 2017-01-02 DIAGNOSIS — Z79899 Other long term (current) drug therapy: Secondary | ICD-10-CM | POA: Diagnosis not present

## 2017-01-02 DIAGNOSIS — M1990 Unspecified osteoarthritis, unspecified site: Secondary | ICD-10-CM

## 2017-01-02 DIAGNOSIS — I82509 Chronic embolism and thrombosis of unspecified deep veins of unspecified lower extremity: Secondary | ICD-10-CM

## 2017-01-02 DIAGNOSIS — R5383 Other fatigue: Secondary | ICD-10-CM

## 2017-01-02 DIAGNOSIS — D45 Polycythemia vera: Secondary | ICD-10-CM | POA: Diagnosis not present

## 2017-01-02 DIAGNOSIS — Z86711 Personal history of pulmonary embolism: Secondary | ICD-10-CM

## 2017-01-02 DIAGNOSIS — Z86718 Personal history of other venous thrombosis and embolism: Secondary | ICD-10-CM | POA: Diagnosis not present

## 2017-01-02 DIAGNOSIS — Z933 Colostomy status: Secondary | ICD-10-CM

## 2017-01-02 DIAGNOSIS — I2782 Chronic pulmonary embolism: Secondary | ICD-10-CM

## 2017-01-02 DIAGNOSIS — Z8673 Personal history of transient ischemic attack (TIA), and cerebral infarction without residual deficits: Secondary | ICD-10-CM

## 2017-01-02 DIAGNOSIS — Z7982 Long term (current) use of aspirin: Secondary | ICD-10-CM

## 2017-01-02 DIAGNOSIS — I10 Essential (primary) hypertension: Secondary | ICD-10-CM | POA: Diagnosis not present

## 2017-01-02 DIAGNOSIS — Z96643 Presence of artificial hip joint, bilateral: Secondary | ICD-10-CM

## 2017-01-02 DIAGNOSIS — Z7901 Long term (current) use of anticoagulants: Secondary | ICD-10-CM

## 2017-01-02 DIAGNOSIS — H02009 Unspecified entropion of unspecified eye, unspecified eyelid: Secondary | ICD-10-CM

## 2017-01-02 LAB — CBC WITH DIFFERENTIAL/PLATELET
Basophils Absolute: 0.1 10*3/uL (ref 0–0.1)
Basophils Relative: 2 %
Eosinophils Absolute: 0.2 10*3/uL (ref 0–0.7)
Eosinophils Relative: 5 %
HCT: 41.2 % (ref 35.0–47.0)
Hemoglobin: 13.2 g/dL (ref 12.0–16.0)
Lymphocytes Relative: 28 %
Lymphs Abs: 1 10*3/uL (ref 1.0–3.6)
MCH: 27.8 pg (ref 26.0–34.0)
MCHC: 32.1 g/dL (ref 32.0–36.0)
MCV: 86.4 fL (ref 80.0–100.0)
Monocytes Absolute: 0.2 10*3/uL (ref 0.2–0.9)
Monocytes Relative: 4 %
Neutro Abs: 2.2 10*3/uL (ref 1.4–6.5)
Neutrophils Relative %: 61 %
Platelets: 1421 10*3/uL (ref 150–400)
RBC: 4.77 MIL/uL (ref 3.80–5.20)
RDW: 30.9 % — ABNORMAL HIGH (ref 11.5–14.5)
WBC: 3.6 10*3/uL (ref 3.6–11.0)

## 2017-01-02 LAB — PROTIME-INR
INR: 1.85
Prothrombin Time: 21.6 seconds — ABNORMAL HIGH (ref 11.4–15.2)

## 2017-01-02 NOTE — Telephone Encounter (Signed)
Called Sport and exercise psychologist at [facility], reports that patient is taking hydroxurea 4 tablets daily and coumadin 4 mg daily as ordered. Reported that all of the stools were negative for occult blood and that they tested several.  Dr. Mike Gip informed, order to increase coumadin to 5 mg on MWF and 4 mg on T TH SAT SUN. Orders given to [employer] at [employer], voiced understanding.

## 2017-01-02 NOTE — Progress Notes (Signed)
[facility] day:  01/02/2017   Chief Complaint: Tiphany Mceachern is a 83 y.o. female with polycythemia rubra vera (PV) who is seen for 2 week assessment on hydroxyurea and Coumadin.  HPI:   The patient was last seen in the medical oncology clinic on 12/18/2016.  At that time, she had recently been in the [facility] ER with blood in her ostomy.  INR was 3.96.  Coumadin was held x 1 day.  She was on Coumadin 4 mg a day.  She noted no further bleeding.  INR was 3.04.  Platelet count was 1.085 million.  Her hydroxyurea was increased to 4 pills/day.  She had been taking 25 pills/week.  As her hematocrit was < 42, she did not have a phlebotomy.  During the interim, she has been "feeling alright". She feels tired but is able to participate in physical therapy at [facility]. She does not feel she is getting much stronger and wants to go home. She states she is taking 4 hydroxurea, 4 days per week and then 3 hydroxurea, 3 days per week for a total of 25 pills/week. She knows what the pills look like and understands she is supposed to be taking 4 hydrea, 7 days per week but they are not giving her that amount. She also states that yesterday they held her Coumadin because "she did not need it".   Her INR was > 3.  She denies any bruising or bleeding.  She denies any pruritus, erthromelalgia, headache, focal numbness or weakness.  She states that she is going to have right eye surgery sometime next month.   Past Medical History:  Diagnosis Date  . Arthritis   . Collagen vascular disease ([place])   . Deep venous thrombosis (HCC)    right lower extremity  . Dependent edema   . History of bilateral hip replacements   . History of hysterectomy   . Hypertension   . Polycythemia vera ([place])   . Stroke [facility])    Pt reports she was told she had a stroke in may    Past Surgical History:  Procedure Laterality Date  . ABDOMINAL HYSTERECTOMY    . CARDIAC CATHETERIZATION  Right 04/03/2015   Procedure: CENTRAL LINE INSERTION;  Surgeon: Sherri Rad, MD;  Location: [facility] ORS;  Service: General;  Laterality: Right;  . COLECTOMY WITH COLOSTOMY CREATION/HARTMANN PROCEDURE N/A 04/03/2015   Procedure: COLECTOMY WITH COLOSTOMY CREATION/HARTMANN PROCEDURE;  Surgeon: Sherri Rad, MD;  Location: [facility] ORS;  Service: General;  Laterality: N/A;  . JOINT REPLACEMENT     Left and Right Hip  . PERIPHERAL VASCULAR CATHETERIZATION N/A 04/02/2015   Procedure: Visceral Angiography;  Surgeon: Algernon Huxley, MD;  Location: [name] [facility];  Service: Cardiovascular;  Laterality: N/A;  . PERIPHERAL VASCULAR CATHETERIZATION N/A 04/02/2015   Procedure: Visceral Artery Intervention;  Surgeon: Algernon Huxley, MD;  Location: [place] [facility];  Service: Cardiovascular;  Laterality: N/A;  . PERIPHERAL VASCULAR CATHETERIZATION N/A 05/25/2015   Procedure: IVC Filter Insertion;  Surgeon: Katha Cabal, MD;  Location: [place] [facility];  Service: Cardiovascular;  Laterality: N/A;  . TOE AMPUTATION     right    Family History  Problem Relation Age of Onset  . Heart attack Father   . COPD Father   . Hypertension    . Arthritis-Osteo    . Diabetes Brother   . Osteosarcoma Son     Social History:  reports that she  has never smoked. She has never used smokeless tobacco. She reports that she does not drink alcohol or use drugs.  She lives alone on [address] at [facility] (independent living).  She is staying at [facility].  Her grand-daughter visits regularly [name] [phone]).  The patient is alone today.   Allergies: No Known Allergies  Current Medications: Current Outpatient Prescriptions  Medication Sig Dispense Refill  . aspirin EC 81 MG tablet Take 1 tablet (81 mg total) by mouth daily. 30 tablet 2  . docusate sodium (COLACE) 100 MG capsule Take 100 mg by mouth 2 (two) times daily as needed for mild constipation.     . folic acid (FOLVITE) A999333 MCG tablet Take 400 mcg  by mouth daily.    . hydroxyurea (HYDREA) 500 MG capsule Pt takes 4 capsules on Wed, Saturday, and Sunday,take 3 capsules on Monday, Tues Thurs, and Friday. (Patient taking differently: 2,000 mg daily. ) 24 capsule 0  . ipratropium-albuterol (DUONEB) 0.5-2.5 (3) MG/3ML SOLN Take 3 mLs by nebulization every 4 (four) hours. 360 mL 6  . ipratropium-albuterol (DUONEB) 0.5-2.5 (3) MG/3ML SOLN Take 3 mLs by nebulization every 6 (six) hours as needed. 3 mL 30  . metoprolol tartrate (LOPRESSOR) 25 MG tablet Take 12.5 mg by mouth 2 (two) times daily.    . Polyethylene Glycol POWD Take 17 g by mouth daily. 527 g 5  . potassium chloride SA (K-DUR,KLOR-CON) 20 MEQ tablet Take 1 tablet (20 mEq total) by mouth 2 (two) times daily. 30 tablet 0  . warfarin (COUMADIN) 4 MG tablet Take 4 mg by mouth daily.    . cefdinir (OMNICEF) 300 MG capsule Take 1 capsule (300 mg total) by mouth 2 (two) times daily. (Patient not taking: Reported on 01/02/2017) 20 capsule 0  . menthol-cetylpyridinium (CEPACOL) 3 MG lozenge Take 1 lozenge (3 mg total) by mouth as needed for sore throat. (Patient not taking: Reported on 11/25/2016) 100 tablet 12   No current facility-administered medications for this visit.     Review of Systems:  GENERAL:  Feels "ok".  No fevers or sweats.  Unable to weigh. PERFORMANCE STATUS (ECOG):  3 HEENT:  Decreased vision in right eye s/p embolic event.  No runny nose, sore throat, mouth sores or tenderness. Lungs:  No shortness of breath or cough.  No hemoptysis. Cardiac:  No chest pain, palpitations, orthopnea, or PND. GI:  No nausea, vomiting, diarrhea, constipation, melena or hematochezia.  Interval blood in stool, resolved. GU:  No urgency, frequency, dysuria, or hematuria.  Wears adult diapers. Musculoskeletal:  Knee problems ("bone on bone").  No muscle tenderness. Extremities:  Chronic swelling in legs. Skin:  Dry skin.  No rashes or skin changes. Neuro:  No headache, numbness or weakness,  balance or coordination issues. Endocrine:  No diabetes, thyroid issues, hot flashes or night sweats. Psych:  No mood changes, depression or anxiety. Pain:  No focal pain. Review of systems:  All other systems reviewed and found to be negative.  Physical Exam: Blood pressure 124/81, pulse 60, temperature 97.2 F (36.2 C), temperature source Tympanic, resp. rate 18, SpO2 98 %. GENERAL:  Elderly woman sitting comfortably in a wheelchair in the exam room in no acute distress. MENTAL STATUS:  Alert and oriented to person, place and time. HEAD: Pearline Cables hair.  Normocephalic, atraumatic, face symmetric, no Cushingoid features. EYES:  Brown eyes.  Right sided entropion.  Pupils equal round and reactive to light and accomodation.  No conjunctivitis or scleral icterus. ENT:  Oropharynx clear without lesion.  Upper dentures.  Tongue normal. Mucous membranes moist.  RESPIRATORY:  Clear to auscultation without rales, wheezes or rhonchi. CARDIOVASCULAR:  Regular rate and rhythm without murmur, rub or gallop. ABDOMEN:  Left sided colostomy.  Stool brown.  Soft, non-tender, with active bowel sounds, and no appreciable hepatosplenomegaly.  No masses. SKIN:  No ulcers or lesions. EXTREMITIES:  Chronic lower extremity changes.  No skin discoloration or tenderness.  No palpable cords. LYMPH NODES: No palpable cervical, supraclavicular, axillary or inguinal adenopathy  NEUROLOGICAL: Unremarkable. PSYCH:  Appropriate.   Appointment on 01/02/2017  Component Date Value Ref Range Status  . WBC 01/02/2017 3.6  3.6 - 11.0 K/uL Final  . RBC 01/02/2017 4.77  3.80 - 5.20 MIL/uL Final  . Hemoglobin 01/02/2017 13.2  12.0 - 16.0 g/dL Final  . HCT 01/02/2017 41.2  35.0 - 47.0 % Final  . MCV 01/02/2017 86.4  80.0 - 100.0 fL Final  . MCH 01/02/2017 27.8  26.0 - 34.0 pg Final  . MCHC 01/02/2017 32.1  32.0 - 36.0 g/dL Final  . RDW 01/02/2017 30.9* 11.5 - 14.5 % Final  . Platelets 01/02/2017 1421* 150 - 440 K/uL Final    Comment: RESULT REPEATED AND VERIFIED CRITICAL RESULT CALLED TO, READ BACK BY AND VERIFIED WITH: BRITTANY PEARCE @10 :22AM 01/02/2017 [facility]   . Neutrophils Relative % 01/02/2017 61  % Final  . Neutro Abs 01/02/2017 2.2  1.4 - 6.5 K/uL Final  . Lymphocytes Relative 01/02/2017 28  % Final  . Lymphs Abs 01/02/2017 1.0  1.0 - 3.6 K/uL Final  . Monocytes Relative 01/02/2017 4  % Final  . Monocytes Absolute 01/02/2017 0.2  0.2 - 0.9 K/uL Final  . Eosinophils Relative 01/02/2017 5  % Final  . Eosinophils Absolute 01/02/2017 0.2  0 - 0.7 K/uL Final  . Basophils Relative 01/02/2017 2  % Final  . Basophils Absolute 01/02/2017 0.1  0 - 0.1 K/uL Final  . Prothrombin Time 01/02/2017 21.6* 11.4 - 15.2 seconds Final  . INR 01/02/2017 1.85   Final    Assessment:  Marinn Hohenberger is a 83 y.o. female African-American woman with JAK2+ polycythemia rubra vera (PV) previously on a phlebotomy program and hydroxyurea. She received P32 in an attempt to manage her counts in 03/2015.   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 She was diagnosed with bilateral lower extremity DVTs on 05/18/2015 and bilateral pulmonary emboli on 05/24/2015. She underwent IVC filter placement on 05/25/2015.  She has been on a fluctuating dose of Coumadin secondary to unstable INR.  She is on a baby aspirin.  She developed progressive erythrocytosis, thrombocytosis, and leukocytosis.  She underwent phlebotomy for a hematocrit of 55.4 on 09/23/2015.  Hematocrit decreased to 50.0, but has again increased after initiation of oral iron.  Platelet count has increased from 1.1 million to 1.4 million.  White count increased from 23,000 - 28,000 to 31,800.  She was admitted on 08/25/2016 with loss of vision in her right eye.  CBC on 08/25/2016 revealed a hematocrit of 40.0, hemoglobin 13.5, MCV 91, platelets 842,000, WBC 4900.  INR was 2.59.   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  MRI and MRA showed no acute changes.  She has been back on hydroxyurea since 10/02/2015.  Initial dose was 1000 mg a day.  She is currently taking 4 pills 3 days/week and 3 pills 4 days/week (total weekly  dose: 24 pills).   She requires periodic phlebotomies (goal hematocrit <= 42).  Platelet count remains elevated (secondary to PV and likely some component of iron deficiency).  Goal platelet count is 400,000.  She is on Coumadin 4 mg a day (total weekly dose 28 mg).  INR goal 2-3.  Symptomatically, she feels "good". She states that she is taking 25 pills Hydrea each week. Exam reveals chronic right sided entropion.  Hematocrit is 41.2.  Platelet count is 1.421 million  WBC is 3,600 ([place] 2200).  INR is 1.85. Patient states Coumadin was held yesterday after an elevated INR.  Plan: 1. Labs today:  CBC with diff, PT/INR. 2. No phelbotomy today. Hematocrit goal < 42. 3. Continue taking hydroxyurea 4 pills/day (28 pills per week). Mallie Snooks, RN  to call to confirm dosing.   4. Guaiac results have been negative.  Can discontinue stools guaiac checks.  5. Continue Coumadin 4 mg/daily. Will find details about held dose yesterday.  6. RTC in 1 week for MD assessment and labs (CBC with diff, PT/INR) and +/- phlebotomy.  Addendum:  Mallie Snooks, RN contacted [facility].  She has been receiving hydroxyurea 4 tablets a day. He did not have her INR checked yesterday. Her Coumadin was not held.  There is a discrepancy between what facility has documented what she has received and what the patient believes she hasreceived.  If this is her correct doe of hydroxyurea, it will need to be increased. She has never received over 2 g a day because of issues with low WBC count. She has received P 32 in the past because of difficulty controlling her platelet count and a low WBC count.  Interferon, anagrelide and busulfan do not  appear to be good alternatives for a multitude of reasons.  I will contact her hematologist at [facility], Dr. Sterling Big regarding a follow-up appointment and recommendations.  I will contact the patient's great granddaughter who has been involved in her care.  As her INR was low, her Coumadin will be increased from 28 mg/week to 31 mg a week (5mg  3 days/week (spread out) and 4 mg 4 days/week).   The patient was seen and examined.  The assessment and plan was discussed with the patient.  Several questions were asked and answered.  Greater than 30 minutes were spent with the patient face to face and coordinating her care.  Faythe Casa, NP  Lequita Asal, MD  01/02/2017, 12:35 PM

## 2017-01-02 NOTE — Progress Notes (Signed)
No phlebotomy today per Dr. Mike Gip. Yolanda Wells

## 2017-01-03 ENCOUNTER — Encounter: Payer: Self-pay | Admitting: Hematology and Oncology

## 2017-01-09 ENCOUNTER — Inpatient Hospital Stay: Payer: Medicare Other | Attending: Oncology

## 2017-01-09 ENCOUNTER — Inpatient Hospital Stay: Payer: Medicare Other

## 2017-01-09 ENCOUNTER — Inpatient Hospital Stay: Payer: Medicare Other | Admitting: Oncology

## 2017-01-09 DIAGNOSIS — I2782 Chronic pulmonary embolism: Secondary | ICD-10-CM

## 2017-01-09 DIAGNOSIS — I82509 Chronic embolism and thrombosis of unspecified deep veins of unspecified lower extremity: Secondary | ICD-10-CM

## 2017-01-09 DIAGNOSIS — Z029 Encounter for administrative examinations, unspecified: Secondary | ICD-10-CM | POA: Diagnosis not present

## 2017-01-09 DIAGNOSIS — D45 Polycythemia vera: Secondary | ICD-10-CM

## 2017-01-09 LAB — CBC WITH DIFFERENTIAL/PLATELET
Basophils Absolute: 0 10*3/uL (ref 0–0.1)
Basophils Relative: 0 %
Eosinophils Absolute: 0.1 10*3/uL (ref 0–0.7)
Eosinophils Relative: 3 %
HCT: 41.3 % (ref 35.0–47.0)
Hemoglobin: 13.6 g/dL (ref 12.0–16.0)
Lymphocytes Relative: 27 %
Lymphs Abs: 1 10*3/uL (ref 1.0–3.6)
MCH: 27.4 pg (ref 26.0–34.0)
MCHC: 32.8 g/dL (ref 32.0–36.0)
MCV: 83.5 fL (ref 80.0–100.0)
Monocytes Absolute: 0.1 10*3/uL — ABNORMAL LOW (ref 0.2–0.9)
Monocytes Relative: 4 %
Neutro Abs: 2.5 10*3/uL (ref 1.4–6.5)
Neutrophils Relative %: 66 %
Platelets: 1034 10*3/uL (ref 150–400)
RBC: 4.94 MIL/uL (ref 3.80–5.20)
RDW: 34.7 % — ABNORMAL HIGH (ref 11.5–14.5)
WBC: 3.7 10*3/uL (ref 3.6–11.0)

## 2017-01-09 LAB — PROTIME-INR
INR: 2.12
Prothrombin Time: 24.1 seconds — ABNORMAL HIGH (ref 11.4–15.2)

## 2017-01-10 ENCOUNTER — Encounter: Payer: Self-pay | Admitting: Hematology and Oncology

## 2017-01-19 ENCOUNTER — Inpatient Hospital Stay: Payer: Medicare Other | Admitting: Hematology and Oncology

## 2017-01-19 NOTE — Progress Notes (Unsigned)
[facility] day:  01/19/2017   Chief Complaint: Yolanda Wells is a 83 y.o. female with polycythemia rubra vera (PV) who is seen for 2 week assessment on hydroxyurea and Coumadin.  HPI:   The patient was last seen in the medical oncology clinic on 01/02/2017.  At that time, she denied any new symptoms.  From the patient's perspective, there was a discrepancy between what the facility had documented what she had received and what the patient believed she had received.  As her INR was low (1.85), her Coumadin will be increased from 28 mg/week to 31 mg a week (5mg  3 days/week (spread out) and 4 mg 4 days/week).  CBC revealed a hematocrit of 41.2, hemoglobin 13.2, MCV 86.4, platelets 1.421 million, and WBC 3600 with an ANC of 2200.  She should have been taking 4 hydroxyurea pills/day.  CBC on 01/09/2017 revealed a hematocrit of 41.2, hemoglobin 13.6, MCV 83.5, platelets 1.034 million, and WBC 3700 with an ANC of 2500.  INR was 2.12.   Past Medical History:  Diagnosis Date  . Arthritis   . Collagen vascular disease ([place])   . Deep venous thrombosis (HCC)    right lower extremity  . Dependent edema   . History of bilateral hip replacements   . History of hysterectomy   . Hypertension   . Polycythemia vera ([place])   . Stroke [facility])    Pt reports she was told she had a stroke in may    Past Surgical History:  Procedure Laterality Date  . ABDOMINAL HYSTERECTOMY    . CARDIAC CATHETERIZATION Right 04/03/2015   Procedure: CENTRAL LINE INSERTION;  Surgeon: Sherri Rad, MD;  Location: [facility] ORS;  Service: General;  Laterality: Right;  . COLECTOMY WITH COLOSTOMY CREATION/HARTMANN PROCEDURE N/A 04/03/2015   Procedure: COLECTOMY WITH COLOSTOMY CREATION/HARTMANN PROCEDURE;  Surgeon: Sherri Rad, MD;  Location: [facility] ORS;  Service: General;  Laterality: N/A;  . JOINT REPLACEMENT     Left and Right Hip  . PERIPHERAL VASCULAR CATHETERIZATION N/A 04/02/2015   Procedure:  Visceral Angiography;  Surgeon: Algernon Huxley, MD;  Location: [place] [facility];  Service: Cardiovascular;  Laterality: N/A;  . PERIPHERAL VASCULAR CATHETERIZATION N/A 04/02/2015   Procedure: Visceral Artery Intervention;  Surgeon: Algernon Huxley, MD;  Location: [place] [facility];  Service: Cardiovascular;  Laterality: N/A;  . PERIPHERAL VASCULAR CATHETERIZATION N/A 05/25/2015   Procedure: IVC Filter Insertion;  Surgeon: Katha Cabal, MD;  Location: [place] [facility];  Service: Cardiovascular;  Laterality: N/A;  . TOE AMPUTATION     right    Family History  Problem Relation Age of Onset  . Heart attack Father   . COPD Father   . Hypertension    . Arthritis-Osteo    . Diabetes Brother   . Osteosarcoma Son     Social History:  reports that she has never smoked. She has never used smokeless tobacco. She reports that she does not drink alcohol or use drugs.  She lives alone on [address] at [facility] (independent living).  She is staying at [facility].  Her grand-daughter visits regularly [name] [phone]).  The patient is alone today.   Allergies: No Known Allergies  Current Medications: Current Outpatient Prescriptions  Medication Sig Dispense Refill  . aspirin EC 81 MG tablet Take 1 tablet (81 mg total) by mouth daily. 30 tablet 2  . cefdinir (OMNICEF) 300 MG capsule Take 1 capsule (300 mg total) by  mouth 2 (two) times daily. (Patient not taking: Reported on 01/02/2017) 20 capsule 0  . docusate sodium (COLACE) 100 MG capsule Take 100 mg by mouth 2 (two) times daily as needed for mild constipation.     . folic acid (FOLVITE) 350 MCG tablet Take 400 mcg by mouth daily.    . hydroxyurea (HYDREA) 500 MG capsule Pt takes 4 capsules on Wed, Saturday, and Sunday,take 3 capsules on Monday, Tues Thurs, and Friday. (Patient taking differently: 2,000 mg daily. ) 24 capsule 0  . ipratropium-albuterol (DUONEB) 0.5-2.5 (3) MG/3ML SOLN Take 3 mLs by nebulization every 4 (four)  hours. 360 mL 6  . ipratropium-albuterol (DUONEB) 0.5-2.5 (3) MG/3ML SOLN Take 3 mLs by nebulization every 6 (six) hours as needed. 3 mL 30  . menthol-cetylpyridinium (CEPACOL) 3 MG lozenge Take 1 lozenge (3 mg total) by mouth as needed for sore throat. (Patient not taking: Reported on 11/25/2016) 100 tablet 12  . metoprolol tartrate (LOPRESSOR) 25 MG tablet Take 12.5 mg by mouth 2 (two) times daily.    . Polyethylene Glycol POWD Take 17 g by mouth daily. 527 g 5  . potassium chloride SA (K-DUR,KLOR-CON) 20 MEQ tablet Take 1 tablet (20 mEq total) by mouth 2 (two) times daily. 30 tablet 0  . warfarin (COUMADIN) 4 MG tablet Take 4 mg by mouth daily.     No current facility-administered medications for this visit.     Review of Systems:  GENERAL:  Feels "ok".  No fevers or sweats.  Unable to weigh. PERFORMANCE STATUS (ECOG):  3 HEENT:  Decreased vision in right eye s/p embolic event.  No runny nose, sore throat, mouth sores or tenderness. Lungs:  No shortness of breath or cough.  No hemoptysis. Cardiac:  No chest pain, palpitations, orthopnea, or PND. GI:  No nausea, vomiting, diarrhea, constipation, melena or hematochezia.  Interval blood in stool, resolved. GU:  No urgency, frequency, dysuria, or hematuria.  Wears adult diapers. Musculoskeletal:  Knee problems ("bone on bone").  No muscle tenderness. Extremities:  Chronic swelling in legs. Skin:  Dry skin.  No rashes or skin changes. Neuro:  No headache, numbness or weakness, balance or coordination issues. Endocrine:  No diabetes, thyroid issues, hot flashes or night sweats. Psych:  No mood changes, depression or anxiety. Pain:  No focal pain. Review of systems:  All other systems reviewed and found to be negative.  Physical Exam: There were no vitals taken for this visit. GENERAL:  Elderly woman sitting comfortably in a wheelchair in the exam room in no acute distress. MENTAL STATUS:  Alert and oriented to person, place and  time. HEAD: Pearline Cables hair.  Normocephalic, atraumatic, face symmetric, no Cushingoid features. EYES:  Brown eyes.  Right sided entropion.  Pupils equal round and reactive to light and accomodation.  No conjunctivitis or scleral icterus. ENT:  Oropharynx clear without lesion.  Upper dentures.  Tongue normal. Mucous membranes moist.  RESPIRATORY:  Clear to auscultation without rales, wheezes or rhonchi. CARDIOVASCULAR:  Regular rate and rhythm without murmur, rub or gallop. ABDOMEN:  Left sided colostomy.  Stool brown.  Soft, non-tender, with active bowel sounds, and no appreciable hepatosplenomegaly.  No masses. SKIN:  No ulcers or lesions. EXTREMITIES:  Chronic lower extremity changes.  No skin discoloration or tenderness.  No palpable cords. LYMPH NODES: No palpable cervical, supraclavicular, axillary or inguinal adenopathy  NEUROLOGICAL: Unremarkable. PSYCH:  Appropriate.   No visits with results within 3 Day(s) from this visit.  Latest known visit with  results is:  Appointment on 01/09/2017  Component Date Value Ref Range Status  . WBC 01/09/2017 3.7  3.6 - 11.0 K/uL Final  . RBC 01/09/2017 4.94  3.80 - 5.20 MIL/uL Final  . Hemoglobin 01/09/2017 13.6  12.0 - 16.0 g/dL Final  . HCT 01/09/2017 41.3  35.0 - 47.0 % Final  . MCV 01/09/2017 83.5  80.0 - 100.0 fL Final  . MCH 01/09/2017 27.4  26.0 - 34.0 pg Final  . MCHC 01/09/2017 32.8  32.0 - 36.0 g/dL Final  . RDW 01/09/2017 34.7* 11.5 - 14.5 % Final  . Platelets 01/09/2017 1034* 150 - 400 K/uL Final   Comment: RESULT REPEATED AND VERIFIED CRITICAL RESULT CALLED TO, READ BACK BY AND VERIFIED WITH: SHERRY VENABLE @9 :37AM 01/09/2017 LGR PLATELET COUNT CONFIRMED BY SMEAR GIANT PLATELETS SEEN   . Neutrophils Relative % 01/09/2017 66  % Final  . Neutro Abs 01/09/2017 2.5  1.4 - 6.5 K/uL Final  . Lymphocytes Relative 01/09/2017 27  % Final  . Lymphs Abs 01/09/2017 1.0  1.0 - 3.6 K/uL Final  . Monocytes Relative 01/09/2017 4  % Final  .  Monocytes Absolute 01/09/2017 0.1* 0.2 - 0.9 K/uL Final  . Eosinophils Relative 01/09/2017 3  % Final  . Eosinophils Absolute 01/09/2017 0.1  0 - 0.7 K/uL Final  . Basophils Relative 01/09/2017 0  % Final  . Basophils Absolute 01/09/2017 0.0  0 - 0.1 K/uL Final  . Smear Review 01/09/2017 CRENATED RBCs   Corrected   Comment: MIXED RBC POPULATION POLYCHROMASIA PRESENT   . Prothrombin Time 01/09/2017 24.1* 11.4 - 15.2 seconds Final  . INR 01/09/2017 2.12   Final    Assessment:  Yolanda Wells is a 83 y.o. female African-American woman with JAK2+ polycythemia rubra vera (PV) previously on a phlebotomy program and hydroxyurea. She received P32 in an attempt to manage her counts in 03/2015.   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 She was diagnosed with bilateral lower extremity DVTs on 05/18/2015 and bilateral pulmonary emboli on 05/24/2015. She underwent IVC filter placement on 05/25/2015.  She has been on a fluctuating dose of Coumadin secondary to unstable INR.  She is on a baby aspirin.  She developed progressive erythrocytosis, thrombocytosis, and leukocytosis.  She underwent phlebotomy for a hematocrit of 55.4 on 09/23/2015.  Hematocrit decreased to 50.0, but has again increased after initiation of oral iron.  Platelet count has increased from 1.1 million to 1.4 million.  White count increased from 23,000 - 28,000 to 31,800.  She was admitted on 08/25/2016 with loss of vision in her right eye.  CBC on 08/25/2016 revealed a hematocrit of 40.0, hemoglobin 13.5, MCV 91, platelets 842,000, WBC 4900.  INR was 2.59.  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  MRI and MRA showed no acute changes.  She has been back on hydroxyurea since 10/02/2015.  Initial dose was 1000 mg a day.  She is currently taking 4 pills 3 days/week and 3 pills 4 days/week  (total weekly dose: 24 pills).   She requires periodic phlebotomies (goal hematocrit <= 42).  Platelet count remains elevated (secondary to PV and likely some component of iron deficiency).  Goal platelet count is 400,000.  She is on Coumadin 4 mg a day (total weekly dose 28 mg).  INR goal 2-3.  Symptomatically, she feels "good". She states that she is taking 25 pills Hydrea each week.  Exam reveals chronic right sided entropion.  Hematocrit is 41.2.  Platelet count is 1.421 million  WBC is 3,600 ([place] 2200).  INR is 1.85. Patient states Coumadin was held yesterday after an elevated INR.  Plan: 1. Labs today:  CBC with diff, CMP, PT/INR.  2. No phelbotomy today. Hematocrit goal < 42. 3. Continue taking hydroxyurea 4 pills/day (28 pills per week). Mallie Snooks, RN  to call to confirm dosing.   4. Guaiac results have been negative.  Can discontinue stools guaiac checks.  5. Continue Coumadin 4 mg/daily. Will find details about held dose yesterday.  6. RTC in 1 week for MD assessment and labs (CBC with diff, PT/INR) and +/- phlebotomy.  Addendum:  Mallie Snooks, RN contacted [facility].  She has been receiving hydroxyurea 4 tablets a day. He did not have her INR checked yesterday. Her Coumadin was not held.  There is a discrepancy between what facility has documented what she has received and what the patient believes she hasreceived.  If this is her correct doe of hydroxyurea, it will need to be increased. She has never received over 2 g a day because of issues with low WBC count. She has received P 32 in the past because of difficulty controlling her platelet count and a low WBC count.  Interferon, anagrelide and busulfan do not appear to be good alternatives for a multitude of reasons.  I will contact her hematologist at [facility], Dr. Sterling Big regarding a follow-up appointment and recommendations.  I will contact the patient's great granddaughter who has been involved in her care.  As her INR was  low, her Coumadin will be increased from 28 mg/week to 31 mg a week (5mg  3 days/week (spread out) and 4 mg 4 days/week).   Lequita Asal, MD  01/19/2017, 5:26 AM

## 2017-01-26 ENCOUNTER — Other Ambulatory Visit: Payer: Self-pay | Admitting: General Surgery

## 2017-01-26 ENCOUNTER — Inpatient Hospital Stay: Payer: Medicare Other | Admitting: Hematology and Oncology

## 2017-01-26 NOTE — Progress Notes (Deleted)
[facility] day:  01/26/2017   Chief Complaint: Yolanda Wells is a 83 y.o. female with polycythemia rubra vera (PV) who is seen for 2 week assessment on hydroxyurea and Coumadin.  HPI:   The patient was last seen in the medical oncology clinic on 01/02/2017.  At that time, she denied any new symptoms.  From the patient's perspective, there was a discrepancy between what the facility had documented what she had received and what the patient believed she had received.  As her INR was low (1.85), her Coumadin will be increased from 28 mg/week to 31 mg a week (5mg  3 days/week (spread out) and 4 mg 4 days/week).  CBC revealed a hematocrit of 41.2, hemoglobin 13.2, MCV 86.4, platelets 1.421 million, and WBC 3600 with an ANC of 2200.  She should have been taking 4 hydroxyurea pills/day.  CBC on 01/09/2017 revealed a hematocrit of 41.2, hemoglobin 13.6, MCV 83.5, platelets 1.034 million, and WBC 3700 with an ANC of 2500.  INR was 2.12.   Past Medical History:  Diagnosis Date  . Arthritis   . Collagen vascular disease ([place])   . Deep venous thrombosis (HCC)    right lower extremity  . Dependent edema   . History of bilateral hip replacements   . History of hysterectomy   . Hypertension   . Polycythemia vera ([place])   . Stroke [facility])    Pt reports she was told she had a stroke in may    Past Surgical History:  Procedure Laterality Date  . ABDOMINAL HYSTERECTOMY    . CARDIAC CATHETERIZATION Right 04/03/2015   Procedure: CENTRAL LINE INSERTION;  Surgeon: Sherri Rad, MD;  Location: [facility] ORS;  Service: General;  Laterality: Right;  . COLECTOMY WITH COLOSTOMY CREATION/HARTMANN PROCEDURE N/A 04/03/2015   Procedure: COLECTOMY WITH COLOSTOMY CREATION/HARTMANN PROCEDURE;  Surgeon: Sherri Rad, MD;  Location: [facility] ORS;  Service: General;  Laterality: N/A;  . JOINT REPLACEMENT     Left and Right Hip  . PERIPHERAL VASCULAR CATHETERIZATION N/A 04/02/2015   Procedure:  Visceral Angiography;  Surgeon: Algernon Huxley, MD;  Location: [place] [facility];  Service: Cardiovascular;  Laterality: N/A;  . PERIPHERAL VASCULAR CATHETERIZATION N/A 04/02/2015   Procedure: Visceral Artery Intervention;  Surgeon: Algernon Huxley, MD;  Location: [place] [facility];  Service: Cardiovascular;  Laterality: N/A;  . PERIPHERAL VASCULAR CATHETERIZATION N/A 05/25/2015   Procedure: IVC Filter Insertion;  Surgeon: Katha Cabal, MD;  Location: [place] [facility];  Service: Cardiovascular;  Laterality: N/A;  . TOE AMPUTATION     right    Family History  Problem Relation Age of Onset  . Heart attack Father   . COPD Father   . Hypertension    . Arthritis-Osteo    . Diabetes Brother   . Osteosarcoma Son     Social History:  reports that she has never smoked. She has never used smokeless tobacco. She reports that she does not drink alcohol or use drugs.  She lives alone on [address] at [facility] (independent living).  She is staying at [facility].  Her grand-daughter visits regularly Yolanda Wells [phone]).  The patient is alone today.   Allergies: No Known Allergies  Current Medications: Current Outpatient Prescriptions  Medication Sig Dispense Refill  . aspirin EC 81 MG tablet Take 1 tablet (81 mg total) by mouth daily. 30 tablet 2  . cefdinir (OMNICEF) 300 MG capsule Take 1 capsule (300 mg total) by  mouth 2 (two) times daily. (Patient not taking: Reported on 01/02/2017) 20 capsule 0  . docusate sodium (COLACE) 100 MG capsule Take 100 mg by mouth 2 (two) times daily as needed for mild constipation.     . folic acid (FOLVITE) 469 MCG tablet Take 400 mcg by mouth daily.    . hydroxyurea (HYDREA) 500 MG capsule Pt takes 4 capsules on Wed, Saturday, and Sunday,take 3 capsules on Monday, Tues Thurs, and Friday. (Patient taking differently: 2,000 mg daily. ) 24 capsule 0  . ipratropium-albuterol (DUONEB) 0.5-2.5 (3) MG/3ML SOLN Take 3 mLs by nebulization every 4 (four)  hours. 360 mL 6  . ipratropium-albuterol (DUONEB) 0.5-2.5 (3) MG/3ML SOLN Take 3 mLs by nebulization every 6 (six) hours as needed. 3 mL 30  . menthol-cetylpyridinium (CEPACOL) 3 MG lozenge Take 1 lozenge (3 mg total) by mouth as needed for sore throat. (Patient not taking: Reported on 11/25/2016) 100 tablet 12  . metoprolol tartrate (LOPRESSOR) 25 MG tablet Take 12.5 mg by mouth 2 (two) times daily.    . Polyethylene Glycol POWD Take 17 g by mouth daily. 527 g 5  . potassium chloride SA (K-DUR,KLOR-CON) 20 MEQ tablet Take 1 tablet (20 mEq total) by mouth 2 (two) times daily. 30 tablet 0  . warfarin (COUMADIN) 4 MG tablet Take 4 mg by mouth daily.     No current facility-administered medications for this visit.     Review of Systems:  GENERAL:  Feels "ok".  No fevers or sweats.  Unable to weigh. PERFORMANCE STATUS (ECOG):  3 HEENT:  Decreased vision in right eye s/p embolic event.  No runny nose, sore throat, mouth sores or tenderness. Lungs:  No shortness of breath or cough.  No hemoptysis. Cardiac:  No chest pain, palpitations, orthopnea, or PND. GI:  No nausea, vomiting, diarrhea, constipation, melena or hematochezia.  Interval blood in stool, resolved. GU:  No urgency, frequency, dysuria, or hematuria.  Wears adult diapers. Musculoskeletal:  Knee problems ("bone on bone").  No muscle tenderness. Extremities:  Chronic swelling in legs. Skin:  Dry skin.  No rashes or skin changes. Neuro:  No headache, numbness or weakness, balance or coordination issues. Endocrine:  No diabetes, thyroid issues, hot flashes or night sweats. Psych:  No mood changes, depression or anxiety. Pain:  No focal pain. Review of systems:  All other systems reviewed and found to be negative.  Physical Exam: There were no vitals taken for this visit. GENERAL:  Elderly woman sitting comfortably in a wheelchair in the exam room in no acute distress. MENTAL STATUS:  Alert and oriented to person, place and  time. HEAD: Pearline Cables hair.  Normocephalic, atraumatic, face symmetric, no Cushingoid features. EYES:  Brown eyes.  Right sided entropion.  Pupils equal round and reactive to light and accomodation.  No conjunctivitis or scleral icterus. ENT:  Oropharynx clear without lesion.  Upper dentures.  Tongue normal. Mucous membranes moist.  RESPIRATORY:  Clear to auscultation without rales, wheezes or rhonchi. CARDIOVASCULAR:  Regular rate and rhythm without murmur, rub or gallop. ABDOMEN:  Left sided colostomy.  Stool brown.  Soft, non-tender, with active bowel sounds, and no appreciable hepatosplenomegaly.  No masses. SKIN:  No ulcers or lesions. EXTREMITIES:  Chronic lower extremity changes.  No skin discoloration or tenderness.  No palpable cords. LYMPH NODES: No palpable cervical, supraclavicular, axillary or inguinal adenopathy  NEUROLOGICAL: Unremarkable. PSYCH:  Appropriate.   No visits with results within 3 Day(s) from this visit.  Latest known visit with  results is:  Appointment on 01/09/2017  Component Date Value Ref Range Status  . WBC 01/09/2017 3.7  3.6 - 11.0 K/uL Final  . RBC 01/09/2017 4.94  3.80 - 5.20 MIL/uL Final  . Hemoglobin 01/09/2017 13.6  12.0 - 16.0 g/dL Final  . HCT 01/09/2017 41.3  35.0 - 47.0 % Final  . MCV 01/09/2017 83.5  80.0 - 100.0 fL Final  . MCH 01/09/2017 27.4  26.0 - 34.0 pg Final  . MCHC 01/09/2017 32.8  32.0 - 36.0 g/dL Final  . RDW 01/09/2017 34.7* 11.5 - 14.5 % Final  . Platelets 01/09/2017 1034* 150 - 400 K/uL Final   Comment: RESULT REPEATED AND VERIFIED CRITICAL RESULT CALLED TO, READ BACK BY AND VERIFIED WITH: SHERRY VENABLE @9 :37AM 01/09/2017 LGR PLATELET COUNT CONFIRMED BY SMEAR GIANT PLATELETS SEEN   . Neutrophils Relative % 01/09/2017 66  % Final  . Neutro Abs 01/09/2017 2.5  1.4 - 6.5 K/uL Final  . Lymphocytes Relative 01/09/2017 27  % Final  . Lymphs Abs 01/09/2017 1.0  1.0 - 3.6 K/uL Final  . Monocytes Relative 01/09/2017 4  % Final  .  Monocytes Absolute 01/09/2017 0.1* 0.2 - 0.9 K/uL Final  . Eosinophils Relative 01/09/2017 3  % Final  . Eosinophils Absolute 01/09/2017 0.1  0 - 0.7 K/uL Final  . Basophils Relative 01/09/2017 0  % Final  . Basophils Absolute 01/09/2017 0.0  0 - 0.1 K/uL Final  . Smear Review 01/09/2017 CRENATED RBCs   Corrected   Comment: MIXED RBC POPULATION POLYCHROMASIA PRESENT   . Prothrombin Time 01/09/2017 24.1* 11.4 - 15.2 seconds Final  . INR 01/09/2017 2.12   Final    Assessment:  Delena Casebeer is a 83 y.o. female African-American woman with JAK2+ polycythemia rubra vera (PV) previously on a phlebotomy program and hydroxyurea. She received P32 in an attempt to manage her counts in 03/2015.   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 She was diagnosed with bilateral lower extremity DVTs on 05/18/2015 and bilateral pulmonary emboli on 05/24/2015. She underwent IVC filter placement on 05/25/2015.  She has been on a fluctuating dose of Coumadin secondary to unstable INR.  She is on a baby aspirin.  She developed progressive erythrocytosis, thrombocytosis, and leukocytosis.  She underwent phlebotomy for a hematocrit of 55.4 on 09/23/2015.  Hematocrit decreased to 50.0, but has again increased after initiation of oral iron.  Platelet count has increased from 1.1 million to 1.4 million.  White count increased from 23,000 - 28,000 to 31,800.  She was admitted on 08/25/2016 with loss of vision in her right eye.  CBC on 08/25/2016 revealed a hematocrit of 40.0, hemoglobin 13.5, MCV 91, platelets 842,000, WBC 4900.  INR was 2.59.  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  MRI and MRA showed no acute changes.  She has been back on hydroxyurea since 10/02/2015.  Initial dose was 1000 mg a day.  She is currently taking 4 pills 3 days/week and 3 pills 4 days/week  (total weekly dose: 24 pills).   She requires periodic phlebotomies (goal hematocrit <= 42).  Platelet count remains elevated (secondary to PV and likely some component of iron deficiency).  Goal platelet count is 400,000.  She is on Coumadin 4 mg a day (total weekly dose 28 mg).  INR goal 2-3.  Symptomatically, she feels "good". She states that she is taking 25 pills Hydrea each week.  Exam reveals chronic right sided entropion.  Hematocrit is 41.2.  Platelet count is 1.421 million  WBC is 3,600 ([place] 2200).  INR is 1.85. Patient states Coumadin was held yesterday after an elevated INR.  Plan: 1. Labs today:  CBC with diff, CMP, PT/INR.  2. No phelbotomy today. Hematocrit goal < 42. 3. Continue taking hydroxyurea 4 pills/day (28 pills per week). Mallie Snooks, RN  to call to confirm dosing.   4. Guaiac results have been negative.  Can discontinue stools guaiac checks.  5. Continue Coumadin 4 mg/daily. Will find details about held dose yesterday.  6. RTC in 1 week for MD assessment and labs (CBC with diff, PT/INR) and +/- phlebotomy.  Addendum:  Mallie Snooks, RN contacted [facility].  She has been receiving hydroxyurea 4 tablets a day. He did not have her INR checked yesterday. Her Coumadin was not held.  There is a discrepancy between what facility has documented what she has received and what the patient believes she hasreceived.  If this is her correct doe of hydroxyurea, it will need to be increased. She has never received over 2 g a day because of issues with low WBC count. She has received P 32 in the past because of difficulty controlling her platelet count and a low WBC count.  Interferon, anagrelide and busulfan do not appear to be good alternatives for a multitude of reasons.  I will contact her hematologist at [facility], Dr. Sterling Big regarding a follow-up appointment and recommendations.  I will contact the patient's great granddaughter who has been involved in her care.  As her INR was  low, her Coumadin will be increased from 28 mg/week to 31 mg a week (5mg  3 days/week (spread out) and 4 mg 4 days/week).   Lequita Asal, MD  01/26/2017, 6:15 AM

## 2017-01-29 ENCOUNTER — Inpatient Hospital Stay: Payer: Medicare Other | Admitting: Hematology and Oncology

## 2017-01-29 ENCOUNTER — Telehealth: Payer: Self-pay | Admitting: *Deleted

## 2017-01-29 NOTE — Telephone Encounter (Signed)
Order faxed.

## 2017-01-29 NOTE — Telephone Encounter (Signed)
Asking that an order be sent to [facility] for PT INR drawn since it has not be done since 2/23. Her appt for 3/12 was changed due to weather and she rescheduled todays for need ing to complete a project and there was no lab ordered anyway. Please inform her if ordered or not

## 2017-01-29 NOTE — Progress Notes (Deleted)
[facility] day:  01/29/2017   Chief Complaint: Yolanda Wells is a 83 y.o. female with polycythemia rubra vera (PV) who is seen for 62month assessment on hydroxyurea and Coumadin.  HPI:   The patient was last seen in the medical oncology clinic on 01/02/2017.  At that time, she denied any new symptoms.  From the patient's perspective, there was a discrepancy between what the facility had documented what she had received and what the patient believed she had received.  As her INR was low (1.85), her Coumadin will be increased from 28 mg/week to 31 mg a week (5mg  3 days/week (spread out) and 4 mg 4 days/week).  CBC revealed a hematocrit of 41.2, hemoglobin 13.2, MCV 86.4, platelets 1.421 million, and WBC 3600 with an ANC of Wells.  She should have been taking 4 hydroxyurea pills/day.  CBC on 01/09/2017 revealed a hematocrit of 41.2, hemoglobin 13.6, MCV 83.5, platelets 1.034 million, and WBC 3700 with an ANC of 2500.  INR was 2.12.   Past Medical History:  Diagnosis Date  . Arthritis   . Collagen vascular disease ([place])   . Deep venous thrombosis (HCC)    right lower extremity  . Dependent edema   . History of bilateral hip replacements   . History of hysterectomy   . Hypertension   . Polycythemia vera ([place])   . Stroke [facility])    Pt reports she was told she had a stroke in may    Past Surgical History:  Procedure Laterality Date  . ABDOMINAL HYSTERECTOMY    . CARDIAC CATHETERIZATION Right 04/03/2015   Procedure: CENTRAL LINE INSERTION;  Surgeon: Yolanda Rad, MD;  Location: [facility] ORS;  Service: General;  Laterality: Right;  . COLECTOMY WITH COLOSTOMY CREATION/HARTMANN PROCEDURE N/A 04/03/2015   Procedure: COLECTOMY WITH COLOSTOMY CREATION/HARTMANN PROCEDURE;  Surgeon: Yolanda Rad, MD;  Location: [facility] ORS;  Service: General;  Laterality: N/A;  . JOINT REPLACEMENT     Left and Right Hip  . PERIPHERAL VASCULAR CATHETERIZATION N/A 04/02/2015   Procedure:  Visceral Angiography;  Surgeon: Yolanda Huxley, MD;  Location: [place] [facility];  Service: Cardiovascular;  Laterality: N/A;  . PERIPHERAL VASCULAR CATHETERIZATION N/A 04/02/2015   Procedure: Visceral Artery Intervention;  Surgeon: Yolanda Huxley, MD;  Location: [place] [facility];  Service: Cardiovascular;  Laterality: N/A;  . PERIPHERAL VASCULAR CATHETERIZATION N/A 05/25/2015   Procedure: IVC Filter Insertion;  Surgeon: Yolanda Cabal, MD;  Location: [place] [facility];  Service: Cardiovascular;  Laterality: N/A;  . TOE AMPUTATION     right    Family History  Problem Relation Age of Onset  . Heart attack Father   . COPD Father   . Hypertension    . Arthritis-Osteo    . Diabetes Brother   . Osteosarcoma Son     Social History:  reports that she has never smoked. She has never used smokeless tobacco. She reports that she does not drink alcohol or use drugs.  She lives alone on [address] at [facility] (independent living).  She is staying at [facility].  Her grand-daughter visits regularly Yolanda Wells [phone]).  The patient is alone today.   Allergies: No Known Allergies  Current Medications: Current Outpatient Prescriptions  Medication Sig Dispense Refill  . aspirin EC 81 MG tablet Take 1 tablet (81 mg total) by mouth daily. 30 tablet 2  . cefdinir (OMNICEF) 300 MG capsule Take 1 capsule (300 mg total) by mouth  2 (two) times daily. (Patient not taking: Reported on 01/02/2017) 20 capsule 0  . docusate sodium (COLACE) 100 MG capsule Take 100 mg by mouth 2 (two) times daily as needed for mild constipation.     . folic acid (FOLVITE) 503 MCG tablet Take 400 mcg by mouth daily.    . hydroxyurea (HYDREA) 500 MG capsule Pt takes 4 capsules on Wed, Saturday, and Sunday,take 3 capsules on Monday, Tues Thurs, and Friday. (Patient taking differently: 2,000 mg daily. ) 24 capsule 0  . ipratropium-albuterol (DUONEB) 0.5-2.5 (3) MG/3ML SOLN Take 3 mLs by nebulization every 4 (four)  hours. 360 mL 6  . ipratropium-albuterol (DUONEB) 0.5-2.5 (3) MG/3ML SOLN Take 3 mLs by nebulization every 6 (six) hours as needed. 3 mL 30  . menthol-cetylpyridinium (CEPACOL) 3 MG lozenge Take 1 lozenge (3 mg total) by mouth as needed for sore throat. (Patient not taking: Reported on 11/25/2016) 100 tablet 12  . metoprolol tartrate (LOPRESSOR) 25 MG tablet Take 12.5 mg by mouth 2 (two) times daily.    . Polyethylene Glycol POWD Take 17 g by mouth daily. 527 g 5  . potassium chloride SA (K-DUR,KLOR-CON) 20 MEQ tablet Take 1 tablet (20 mEq total) by mouth 2 (two) times daily. 30 tablet 0  . warfarin (COUMADIN) 4 MG tablet Take 4 mg by mouth daily.     No current facility-administered medications for this visit.     Review of Systems:  GENERAL:  Feels "ok".  No fevers or sweats.  Unable to weigh. PERFORMANCE STATUS (ECOG):  3 HEENT:  Decreased vision in right eye s/p embolic event.  No runny nose, sore throat, mouth sores or tenderness. Lungs:  No shortness of breath or cough.  No hemoptysis. Cardiac:  No chest pain, palpitations, orthopnea, or PND. GI:  No nausea, vomiting, diarrhea, constipation, melena or hematochezia.  Interval blood in stool, resolved. GU:  No urgency, frequency, dysuria, or hematuria.  Wears adult diapers. Musculoskeletal:  Knee problems ("bone on bone").  No muscle tenderness. Extremities:  Chronic swelling in legs. Skin:  Dry skin.  No rashes or skin changes. Neuro:  No headache, numbness or weakness, balance or coordination issues. Endocrine:  No diabetes, thyroid issues, hot flashes or night sweats. Psych:  No mood changes, depression or anxiety. Pain:  No focal pain. Review of systems:  All other systems reviewed and found to be negative.  Physical Exam: There were no vitals taken for this visit. GENERAL:  Elderly woman sitting comfortably in a wheelchair in the exam room in no acute distress. MENTAL STATUS:  Alert and oriented to person, place and  time. HEAD: Pearline Cables hair.  Normocephalic, atraumatic, face symmetric, no Cushingoid features. EYES:  Brown eyes.  Right sided entropion.  Pupils equal round and reactive to light and accomodation.  No conjunctivitis or scleral icterus. ENT:  Oropharynx clear without lesion.  Upper dentures.  Tongue normal. Mucous membranes moist.  RESPIRATORY:  Clear to auscultation without rales, wheezes or rhonchi. CARDIOVASCULAR:  Regular rate and rhythm without murmur, rub or gallop. ABDOMEN:  Left sided colostomy.  Stool brown.  Soft, non-tender, with active bowel sounds, and no appreciable hepatosplenomegaly.  No masses. SKIN:  No ulcers or lesions. EXTREMITIES:  Chronic lower extremity changes.  No skin discoloration or tenderness.  No palpable cords. LYMPH NODES: No palpable cervical, supraclavicular, axillary or inguinal adenopathy  NEUROLOGICAL: Unremarkable. PSYCH:  Appropriate.   No visits with results within 3 Day(s) from this visit.  Latest known visit with results  is:  Appointment on 01/09/2017  Component Date Value Ref Range Status  . WBC 01/09/2017 3.7  3.6 - 11.0 K/uL Final  . RBC 01/09/2017 4.94  3.80 - 5.20 MIL/uL Final  . Hemoglobin 01/09/2017 13.6  12.0 - 16.0 g/dL Final  . HCT 01/09/2017 41.3  35.0 - 47.0 % Final  . MCV 01/09/2017 83.5  80.0 - 100.0 fL Final  . MCH 01/09/2017 27.4  26.0 - 34.0 pg Final  . MCHC 01/09/2017 32.8  32.0 - 36.0 g/dL Final  . RDW 01/09/2017 34.7* 11.5 - 14.5 % Final  . Platelets 01/09/2017 1034* 150 - 400 K/uL Final   Comment: RESULT REPEATED AND VERIFIED CRITICAL RESULT CALLED TO, READ BACK BY AND VERIFIED WITH: SHERRY Wells @9 :37AM 01/09/2017 LGR PLATELET COUNT CONFIRMED BY SMEAR GIANT PLATELETS SEEN   . Neutrophils Relative % 01/09/2017 66  % Final  . Neutro Abs 01/09/2017 2.5  1.4 - 6.5 K/uL Final  . Lymphocytes Relative 01/09/2017 27  % Final  . Lymphs Abs 01/09/2017 1.0  1.0 - 3.6 K/uL Final  . Monocytes Relative 01/09/2017 4  % Final  .  Monocytes Absolute 01/09/2017 0.1* 0.2 - 0.9 K/uL Final  . Eosinophils Relative 01/09/2017 3  % Final  . Eosinophils Absolute 01/09/2017 0.1  0 - 0.7 K/uL Final  . Basophils Relative 01/09/2017 0  % Final  . Basophils Absolute 01/09/2017 0.0  0 - 0.1 K/uL Final  . Smear Review 01/09/2017 CRENATED RBCs   Corrected   Comment: MIXED RBC POPULATION POLYCHROMASIA PRESENT   . Prothrombin Time 01/09/2017 24.1* 11.4 - 15.2 seconds Final  . INR 01/09/2017 2.12   Final    Assessment:  Antaniya Venuti is a 84 y.o. female African-American woman with JAK2+ polycythemia rubra vera (PV) previously on a phlebotomy program and hydroxyurea. She received P32 in an attempt to manage her counts in 03/2015.   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 She was diagnosed with bilateral lower extremity DVTs on 05/18/2015 and bilateral pulmonary emboli on 05/24/2015. She underwent IVC filter placement on 05/25/2015.  She has been on a fluctuating dose of Coumadin secondary to unstable INR.  She is on a baby aspirin.  She developed progressive erythrocytosis, thrombocytosis, and leukocytosis.  She underwent phlebotomy for a hematocrit of 55.4 on 09/23/2015.  Hematocrit decreased to 50.0, but has again increased after initiation of oral iron.  Platelet count has increased from 1.1 million to 1.4 million.  White count increased from 23,000 - 28,000 to 31,800.  She was admitted on 08/25/2016 with loss of vision in her right eye.  CBC on 08/25/2016 revealed a hematocrit of 40.0, hemoglobin 13.5, MCV 91, platelets 842,000, WBC 4900.  INR was 2.59.  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  MRI and MRA showed no acute changes.  She has been back on hydroxyurea since 10/02/2015.  Initial dose was 1000 mg a day.  She is currently taking 4 pills 3 days/week and 3 pills 4 days/week  (total weekly dose: 24 pills).   She requires periodic phlebotomies (goal hematocrit <= 42).  Platelet count remains elevated (secondary to PV and likely some component of iron deficiency).  Goal platelet count is 400,000.  She is on Coumadin 4 mg a day (total weekly dose 28 mg).  INR goal 2-3.  Symptomatically, she feels "good". She states that she is taking 25 pills Hydrea each week. Exam  reveals chronic right sided entropion.  Hematocrit is 41.2.  Platelet count is 1.421 million  WBC is 3,600 (Yolanda Wells).  INR is 1.85. Patient states Coumadin was held yesterday after an elevated INR.  Plan: 1. Labs today:  CBC with diff, CMP, PT/INR.  2. No phelbotomy today. Hematocrit goal < 42. 3. Continue taking hydroxyurea 4 pills/day (28 pills per week). Yolanda Snooks, RN  to call to confirm dosing.   4. Guaiac results have been negative.  Can discontinue stools guaiac checks.  5. Continue Coumadin 4 mg/daily. Will find details about held dose yesterday.  6. RTC in 1 week for MD assessment and labs (CBC with diff, PT/INR) and +/- phlebotomy.  Addendum:  Yolanda Snooks, RN contacted [facility].  She has been receiving hydroxyurea 4 tablets a day. He did not have her INR checked yesterday. Her Coumadin was not held.  There is a discrepancy between what facility has documented what she has received and what the patient believes she hasreceived.  If this is her correct doe of hydroxyurea, it will need to be increased. She has never received over 2 g a day because of issues with low WBC count. She has received P 32 in the past because of difficulty controlling her platelet count and a low WBC count.  Interferon, anagrelide and busulfan do not appear to be good alternatives for a multitude of reasons.  I will contact her hematologist at [facility], Yolanda Wells regarding a follow-up appointment and recommendations.  I will contact the patient's great granddaughter who has been involved in her care.  As her INR was  low, her Coumadin will be increased from 28 mg/week to 31 mg a week (5mg  3 days/week (spread out) and 4 mg 4 days/week).   Yolanda Asal, MD  01/29/2017, 5:01 AM

## 2017-01-29 NOTE — Telephone Encounter (Signed)
Confirmed with [facility] that She is their patient and got fax number. [phone] Per Dr Mike Gip, have them draw CBC, CMP, PT/INR. Order filled out and given to [facility] for Dr Humberto Seals to sign and she will fax it.

## 2017-01-30 ENCOUNTER — Encounter: Payer: Self-pay | Admitting: Hematology and Oncology

## 2017-02-03 ENCOUNTER — Telehealth: Payer: Self-pay | Admitting: *Deleted

## 2017-02-03 NOTE — Telephone Encounter (Signed)
Per Dr Janese Banks hold coumadin times 2 days. Then recheck PT on Thursday .  I spoke with Luvenia Heller with [facility] and she repeated orders back to me. I asked that the PT be drawn early and run stat with a called report.

## 2017-02-03 NOTE — Telephone Encounter (Signed)
Lab results have been faxed and INR is >4.0

## 2017-02-05 ENCOUNTER — Telehealth: Payer: Self-pay | Admitting: *Deleted

## 2017-02-05 ENCOUNTER — Encounter: Payer: Self-pay | Admitting: Hematology and Oncology

## 2017-02-05 LAB — PROTIME-INR

## 2017-02-05 NOTE — Telephone Encounter (Signed)
  Please let Dr. Rogue Bussing or Dr Janese Banks (she held her Coumadin last) know about INR and make any adjustment in Coumadin dosing.

## 2017-02-05 NOTE — Telephone Encounter (Signed)
Called to report that her lab drawn for PT /INR  Today was not run due to being hemolyzed and asking for new orders. I returned call and advised they redraw stat and call report as previous order.

## 2017-02-06 ENCOUNTER — Telehealth: Payer: Self-pay | Admitting: *Deleted

## 2017-02-06 NOTE — Telephone Encounter (Signed)
Called granddaughter and informed her of pts pt/inr results and to continue the coumadin at 4 mg daily as ordered per Dr. Janese Banks and that we would recheck her labs next week.  Will call home health company for lab check. Voiced understanding.

## 2017-02-06 NOTE — Telephone Encounter (Signed)
Crystal at [facility] notified of need to draw labs on 02-12-17 and 02-19-17 for pt/inr and fax results to [place]. Voiced understanding.

## 2017-02-12 ENCOUNTER — Telehealth: Payer: Self-pay | Admitting: *Deleted

## 2017-02-12 DIAGNOSIS — D45 Polycythemia vera: Secondary | ICD-10-CM

## 2017-02-12 LAB — PROTIME-INR

## 2017-02-12 MED ORDER — HYDROXYUREA 500 MG PO CAPS: ORAL_CAPSULE | ORAL | 1 refills | Status: DC

## 2017-02-12 NOTE — Telephone Encounter (Signed)
INR 2.8 today per labs, Dr. Janese Banks stated that she is to remain on coumadin 4 mg daily, [name] granddaughter informed of coumadin dosing voiced understanding.

## 2017-02-12 NOTE — Telephone Encounter (Signed)
Called patient

## 2017-02-12 NOTE — Telephone Encounter (Signed)
Asking that Yolanda Wells return her call. [phone]

## 2017-02-20 ENCOUNTER — Encounter: Payer: Self-pay | Admitting: Hematology and Oncology

## 2017-02-20 ENCOUNTER — Other Ambulatory Visit: Payer: Self-pay | Admitting: *Deleted

## 2017-02-20 ENCOUNTER — Telehealth: Payer: Self-pay | Admitting: *Deleted

## 2017-02-20 ENCOUNTER — Inpatient Hospital Stay: Payer: Medicare Other | Admitting: Hematology and Oncology

## 2017-02-20 DIAGNOSIS — D45 Polycythemia vera: Secondary | ICD-10-CM

## 2017-02-20 NOTE — Telephone Encounter (Signed)
Home health RN with [facility] called to report pt had assisted fall at home on 02/18/17 around 4:30pm. EMS was called to evaluate patient. Pt had no injuries after fall and is doing well at this time.

## 2017-02-20 NOTE — Progress Notes (Deleted)
[facility] [facility] day:  02/20/2017   Chief Complaint: Yolanda Wells is a 83 y.o. female with polycythemia rubra vera (PV) who is seen for 45month assessment on hydroxyurea and Coumadin.  HPI:   The patient was last seen in the medical oncology clinic on 01/02/2017.  At that time, she denied any new symptoms.  From the patient's perspective, there was a discrepancy between what the facility had documented what she had received and what the patient believed she had received.  As her INR was low (1.85), her Coumadin will be increased from 28 mg/week to 31 mg a week (5mg  3 days/week (spread out) and 4 mg 4 days/week).  CBC revealed a hematocrit of 41.2, hemoglobin 13.2, MCV 86.4, platelets 1.421 million, and WBC 3600 with an ANC of 2200.  She should have been taking 4 hydroxyurea pills/day.  CBC on 01/09/2017 revealed a hematocrit of 41.2, hemoglobin 13.6, MCV 83.5, platelets 1.034 million, and WBC 3700 with an ANC of 2500.  INR was 2.12.  Outside labs on 01/30/2017 revealed a hematocrit of 42.1, hemoglobin 13.1, MCV 85.9, platelets 839,000, WBC 2900 with an ANC of 1711.  INR was not performed as the tube clotted.  INR was 2.5 on 02/05/2017.    Past Medical History:  Diagnosis Date  . Arthritis   . Collagen vascular disease ([place])   . Deep venous thrombosis (HCC)    right lower extremity  . Dependent edema   . History of bilateral hip replacements   . History of hysterectomy   . Hypertension   . Polycythemia vera ([place])   . Stroke [facility])    Pt reports she was told she had a stroke in may    Past Surgical History:  Procedure Laterality Date  . ABDOMINAL HYSTERECTOMY    . CARDIAC CATHETERIZATION Right 04/03/2015   Procedure: CENTRAL LINE INSERTION;  Surgeon: Sherri Rad, MD;  Location: [facility] ORS;  Service: General;  Laterality: Right;  . COLECTOMY WITH COLOSTOMY CREATION/HARTMANN PROCEDURE N/A 04/03/2015   Procedure: COLECTOMY WITH COLOSTOMY  CREATION/HARTMANN PROCEDURE;  Surgeon: Sherri Rad, MD;  Location: [facility] ORS;  Service: General;  Laterality: N/A;  . JOINT REPLACEMENT     Left and Right Hip  . PERIPHERAL VASCULAR CATHETERIZATION N/A 04/02/2015   Procedure: Visceral Angiography;  Surgeon: Algernon Huxley, MD;  Location: [place] [facility];  Service: Cardiovascular;  Laterality: N/A;  . PERIPHERAL VASCULAR CATHETERIZATION N/A 04/02/2015   Procedure: Visceral Artery Intervention;  Surgeon: Algernon Huxley, MD;  Location: [place] [facility];  Service: Cardiovascular;  Laterality: N/A;  . PERIPHERAL VASCULAR CATHETERIZATION N/A 05/25/2015   Procedure: IVC Filter Insertion;  Surgeon: Katha Cabal, MD;  Location: [place] [facility];  Service: Cardiovascular;  Laterality: N/A;  . TOE AMPUTATION     right    Family History  Problem Relation Age of Onset  . Heart attack Father   . COPD Father   . Hypertension    . Arthritis-Osteo    . Diabetes Brother   . Osteosarcoma Son     Social History:  reports that she has never smoked. She has never used smokeless tobacco. She reports that she does not drink alcohol or use drugs.  She lives alone on [address] at [facility] (independent living).  She is staying at [facility].  Her grand-daughter visits regularly [name] [phone]).  The patient is alone today.   Allergies: No Known Allergies  Current Medications: Current Outpatient Prescriptions  Medication Sig Dispense Refill  . aspirin EC 81 MG tablet Take 1 tablet (81 mg total) by mouth daily. 30 tablet 2  . cefdinir (OMNICEF) 300 MG capsule Take 1 capsule (300 mg total) by mouth 2 (two) times daily. (Patient not taking: Reported on 01/02/2017) 20 capsule 0  . docusate sodium (COLACE) 100 MG capsule Take 100 mg by mouth 2 (two) times daily as needed for mild constipation.     . folic acid (FOLVITE) 836 MCG tablet Take 400 mcg by mouth daily.    . hydroxyurea (HYDREA) 500 MG capsule Take  4 tablets daily 120 capsule 1   . ipratropium-albuterol (DUONEB) 0.5-2.5 (3) MG/3ML SOLN Take 3 mLs by nebulization every 4 (four) hours. 360 mL 6  . ipratropium-albuterol (DUONEB) 0.5-2.5 (3) MG/3ML SOLN Take 3 mLs by nebulization every 6 (six) hours as needed. 3 mL 30  . menthol-cetylpyridinium (CEPACOL) 3 MG lozenge Take 1 lozenge (3 mg total) by mouth as needed for sore throat. (Patient not taking: Reported on 11/25/2016) 100 tablet 12  . metoprolol tartrate (LOPRESSOR) 25 MG tablet Take 12.5 mg by mouth 2 (two) times daily.    . Polyethylene Glycol POWD Take 17 g by mouth daily. 527 g 5  . potassium chloride SA (K-DUR,KLOR-CON) 20 MEQ tablet Take 1 tablet (20 mEq total) by mouth 2 (two) times daily. 30 tablet 0  . warfarin (COUMADIN) 4 MG tablet Take 4 mg by mouth daily.      No current facility-administered medications for this visit.     Review of Systems:  GENERAL:  Feels "ok".  No fevers or sweats.  Unable to weigh. PERFORMANCE STATUS (ECOG):  3 HEENT:  Decreased vision in right eye s/p embolic event.  No runny nose, sore throat, mouth sores or tenderness. Lungs:  No shortness of breath or cough.  No hemoptysis. Cardiac:  No chest pain, palpitations, orthopnea, or PND. GI:  No nausea, vomiting, diarrhea, constipation, melena or hematochezia.  Interval blood in stool, resolved. GU:  No urgency, frequency, dysuria, or hematuria.  Wears adult diapers. Musculoskeletal:  Knee problems ("bone on bone").  No muscle tenderness. Extremities:  Chronic swelling in legs. Skin:  Dry skin.  No rashes or skin changes. Neuro:  No headache, numbness or weakness, balance or coordination issues. Endocrine:  No diabetes, thyroid issues, hot flashes or night sweats. Psych:  No mood changes, depression or anxiety. Pain:  No focal pain. Review of systems:  All other systems reviewed and found to be negative.  Physical Exam: There were no vitals taken for this visit. GENERAL:  Elderly woman sitting comfortably in a wheelchair in  the exam room in no acute distress. MENTAL STATUS:  Alert and oriented to person, place and time. HEAD: Pearline Cables hair.  Normocephalic, atraumatic, face symmetric, no Cushingoid features. EYES:  Brown eyes.  Right sided entropion.  Pupils equal round and reactive to light and accomodation.  No conjunctivitis or scleral icterus. ENT:  Oropharynx clear without lesion.  Upper dentures.  Tongue normal. Mucous membranes moist.  RESPIRATORY:  Clear to auscultation without rales, wheezes or rhonchi. CARDIOVASCULAR:  Regular rate and rhythm without murmur, rub or gallop. ABDOMEN:  Left sided colostomy.  Stool brown.  Soft, non-tender, with active bowel sounds, and no appreciable hepatosplenomegaly.  No masses. SKIN:  No ulcers or lesions. EXTREMITIES:  Chronic lower extremity changes.  No skin discoloration or tenderness.  No palpable cords. LYMPH NODES: No palpable cervical, supraclavicular, axillary or inguinal adenopathy  NEUROLOGICAL: Unremarkable. PSYCH:  Appropriate.   No visits with results within 3 Day(s) from this visit.  Latest known visit with results is:  Appointment on 01/09/2017  Component Date Value Ref Range Status  . WBC 01/09/2017 3.7  3.6 - 11.0 K/uL Final  . RBC 01/09/2017 4.94  3.80 - 5.20 MIL/uL Final  . Hemoglobin 01/09/2017 13.6  12.0 - 16.0 g/dL Final  . HCT 01/09/2017 41.3  35.0 - 47.0 % Final  . MCV 01/09/2017 83.5  80.0 - 100.0 fL Final  . MCH 01/09/2017 27.4  26.0 - 34.0 pg Final  . MCHC 01/09/2017 32.8  32.0 - 36.0 g/dL Final  . RDW 01/09/2017 34.7* 11.5 - 14.5 % Final  . Platelets 01/09/2017 1034* 150 - 400 K/uL Final   Comment: RESULT REPEATED AND VERIFIED CRITICAL RESULT CALLED TO, READ BACK BY AND VERIFIED WITH: SHERRY VENABLE @9 :37AM 01/09/2017 [facility] PLATELET COUNT CONFIRMED BY SMEAR GIANT PLATELETS SEEN   . Neutrophils Relative % 01/09/2017 66  % Final  . Neutro Abs 01/09/2017 2.5  1.4 - 6.5 K/uL Final  . Lymphocytes Relative 01/09/2017 27  % Final  . Lymphs  Abs 01/09/2017 1.0  1.0 - 3.6 K/uL Final  . Monocytes Relative 01/09/2017 4  % Final  . Monocytes Absolute 01/09/2017 0.1* 0.2 - 0.9 K/uL Final  . Eosinophils Relative 01/09/2017 3  % Final  . Eosinophils Absolute 01/09/2017 0.1  0 - 0.7 K/uL Final  . Basophils Relative 01/09/2017 0  % Final  . Basophils Absolute 01/09/2017 0.0  0 - 0.1 K/uL Final  . Smear Review 01/09/2017 CRENATED RBCs   Corrected   Comment: MIXED RBC POPULATION POLYCHROMASIA PRESENT   . Prothrombin Time 01/09/2017 24.1* 11.4 - 15.2 seconds Final  . INR 01/09/2017 2.12   Final    Assessment:  Natalie Mceuen is a 83 y.o. female African-American woman with JAK2+ polycythemia rubra vera (PV) previously on a phlebotomy program and hydroxyurea. She received P32 in an attempt to manage her counts in 03/2015.   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 She was diagnosed with bilateral lower extremity DVTs on 05/18/2015 and bilateral pulmonary emboli on 05/24/2015. She underwent IVC filter placement on 05/25/2015.  She has been on a fluctuating dose of Coumadin secondary to unstable INR.  She is on a baby aspirin.  She developed progressive erythrocytosis, thrombocytosis, and leukocytosis.  She underwent phlebotomy for a hematocrit of 55.4 on 09/23/2015.  Hematocrit decreased to 50.0, but has again increased after initiation of oral iron.  Platelet count has increased from 1.1 million to 1.4 million.  White count increased from 23,000 - 28,000 to 31,800.  She was admitted on 08/25/2016 with loss of vision in her right eye.  CBC on 08/25/2016 revealed a hematocrit of 40.0, hemoglobin 13.5, MCV 91, platelets 842,000, WBC 4900.  INR was 2.59.  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  MRI and MRA showed no acute changes.  She has been back on hydroxyurea since 10/02/2015.  Initial dose  was 1000 mg a day.  She is currently taking 4 pills 3 days/week and 3 pills 4 days/week (total weekly dose: 24 pills).   She requires periodic phlebotomies (goal hematocrit <= 42).  Platelet count remains elevated (secondary to PV and likely some component of iron deficiency).  Goal platelet count is 400,000.  She is on Coumadin 4 mg a day (total weekly dose 28 mg).  INR  goal 2-3.  Symptomatically, she feels "good". She states that she is taking 25 pills Hydrea each week. Exam reveals chronic right sided entropion.  Hematocrit is 41.2.  Platelet count is 1.421 million  WBC is 3,600 ([place] 2200).  INR is 1.85. Patient states Coumadin was held yesterday after an elevated INR.  Plan: 1. Labs today:  CBC with diff, CMP, PT/INR.  2. No phelbotomy today. Hematocrit goal < 42. 3. Continue taking hydroxyurea 4 pills/day (28 pills per week). Mallie Snooks, RN  to call to confirm dosing.   4. Guaiac results have been negative.  Can discontinue stools guaiac checks.  5. Continue Coumadin 4 mg/daily. Will find details about held dose yesterday.  6. RTC in 1 week for MD assessment and labs (CBC with diff, PT/INR) and +/- phlebotomy.  Addendum:  Mallie Snooks, RN contacted [facility].  She has been receiving hydroxyurea 4 tablets a day. He did not have her INR checked yesterday. Her Coumadin was not held.  There is a discrepancy between what facility has documented what she has received and what the patient believes she hasreceived.  If this is her correct doe of hydroxyurea, it will need to be increased. She has never received over 2 g a day because of issues with low WBC count. She has received P 32 in the past because of difficulty controlling her platelet count and a low WBC count.  Interferon, anagrelide and busulfan do not appear to be good alternatives for a multitude of reasons.  I will contact her hematologist at [facility], Dr. Sterling Big regarding a follow-up appointment and recommendations.  I will contact  the patient's great granddaughter who has been involved in her care.  As her INR was low, her Coumadin will be increased from 28 mg/week to 31 mg a week (5mg  3 days/week (spread out) and 4 mg 4 days/week).   Lequita Asal, MD  02/20/2017, 3:20 AM

## 2017-02-23 ENCOUNTER — Emergency Department
Admission: EM | Admit: 2017-02-23 | Discharge: 2017-02-23 | Disposition: A | Discharge status: Home / Self Care | Payer: Medicare Other | Attending: Emergency Medicine | Admitting: Emergency Medicine

## 2017-02-23 ENCOUNTER — Encounter: Payer: Self-pay | Admitting: Emergency Medicine

## 2017-02-23 DIAGNOSIS — Z7982 Long term (current) use of aspirin: Secondary | ICD-10-CM | POA: Diagnosis not present

## 2017-02-23 DIAGNOSIS — R21 Rash and other nonspecific skin eruption: Secondary | ICD-10-CM | POA: Insufficient documentation

## 2017-02-23 DIAGNOSIS — N390 Urinary tract infection, site not specified: Secondary | ICD-10-CM | POA: Insufficient documentation

## 2017-02-23 DIAGNOSIS — I10 Essential (primary) hypertension: Secondary | ICD-10-CM | POA: Insufficient documentation

## 2017-02-23 DIAGNOSIS — Z79899 Other long term (current) drug therapy: Secondary | ICD-10-CM | POA: Insufficient documentation

## 2017-02-23 DIAGNOSIS — J441 Chronic obstructive pulmonary disease with (acute) exacerbation: Secondary | ICD-10-CM | POA: Insufficient documentation

## 2017-02-23 DIAGNOSIS — R42 Dizziness and giddiness: Secondary | ICD-10-CM | POA: Diagnosis not present

## 2017-02-23 LAB — CBC
HCT: 39.5 % (ref 35.0–47.0)
HEMOGLOBIN: 12.9 g/dL (ref 12.0–16.0)
MCH: 28.4 pg (ref 26.0–34.0)
MCHC: 32.6 g/dL (ref 32.0–36.0)
MCV: 87.3 fL (ref 80.0–100.0)
PLATELETS: 785 10*3/uL — AB (ref 150–440)
RBC: 4.52 MIL/uL (ref 3.80–5.20)
RDW: 36.5 % — ABNORMAL HIGH (ref 11.5–14.5)
WBC: 2.5 10*3/uL — AB (ref 3.6–11.0)

## 2017-02-23 LAB — BASIC METABOLIC PANEL
Anion gap: 8 (ref 5–15)
BUN: 14 mg/dL (ref 6–20)
CALCIUM: 8.6 mg/dL — AB (ref 8.9–10.3)
CO2: 24 mmol/L (ref 22–32)
CREATININE: 0.64 mg/dL (ref 0.44–1.00)
Chloride: 105 mmol/L (ref 101–111)
GFR calc Af Amer: >60 mL/min (ref >60)
GFR calc non Af Amer: >60 mL/min (ref >60)
GLUCOSE: 87 mg/dL (ref 65–99)
POTASSIUM: 3.8 mmol/L (ref 3.5–5.1)
SODIUM: 137 mmol/L (ref 135–145)

## 2017-02-23 LAB — URINALYSIS, COMPLETE (UACMP) WITH MICROSCOPIC
BILIRUBIN URINE: NEGATIVE
Glucose, UA: NEGATIVE mg/dL
HGB URINE DIPSTICK: NEGATIVE
KETONES UR: NEGATIVE mg/dL
Nitrite: POSITIVE — AB
PH: 5 (ref 5.0–8.0)
PROTEIN: NEGATIVE mg/dL
RBC / HPF: NONE SEEN RBC/hpf (ref 0–5)
Specific Gravity, Urine: 1.019 (ref 1.005–1.030)

## 2017-02-23 MED ORDER — CEFTRIAXONE SODIUM 1 G IJ SOLR
1.0000 g | Freq: Once | INTRAMUSCULAR | Status: AC
  Administered 2017-02-23: 1 g via INTRAMUSCULAR
  Filled 2017-02-23: qty 10

## 2017-02-23 MED ORDER — DEXTROSE 5 % IV SOLN: 1.0000 g | Freq: Once | INTRAVENOUS | Status: DC

## 2017-02-23 MED ORDER — CEFTRIAXONE SODIUM-DEXTROSE 1-3.74 GM-% IV SOLR
1.0000 g | Freq: Once | INTRAVENOUS | Status: DC
  Filled 2017-02-23: qty 50

## 2017-02-23 MED ORDER — CEPHALEXIN 500 MG PO CAPS: 500.0000 mg | ORAL_CAPSULE | Freq: Two times a day (BID) | ORAL | 0 refills | Status: DC

## 2017-02-23 MED ORDER — LIDOCAINE HCL (PF) 1 % IJ SOLN
INTRAMUSCULAR | Status: AC
  Filled 2017-02-23: qty 5

## 2017-02-23 NOTE — ED Notes (Signed)
This nurse attempted IV twice without success

## 2017-02-23 NOTE — ED Notes (Signed)
Laboratory at bedside for blood cultures.

## 2017-02-23 NOTE — ED Notes (Signed)
EMS  CALLED  TO  TAKE  PT  HOME

## 2017-02-23 NOTE — Discharge Instructions (Signed)
You were evaluated for right-sided skin rash which is been there for quite some time, and your exam and evaluation are reassuring in the emergency department. You do need to follow-up with a dermatologist as an outpatient, please make an appointment.  You also found to have a urinary tract infection and were given a 24-hour antibiotic Rocephin in the emergency department today. Start your antibiotic tomorrow.  Return to the emergency department immediately for any worsening condition including confusion or altered mental status, dizziness or passing out, abdominal pain, fever, vomiting, or any other symptoms concerning to you.

## 2017-02-23 NOTE — ED Provider Notes (Signed)
[facility] Emergency Department Provider Note ____________________________________________   I have reviewed the triage vital signs and the triage nursing note.  HISTORY  Chief Complaint Rash and Dizziness   Historian Patient  HPI Yolanda Wells is a 83 y.o. female presenting for evaluation of right sided facial rash. She states it's been there for several months now. She feels like it is getting worse. She states it's getting worse in terms of total area affected as well as itchy. Sometimes it hurts. There is no drainage. There is no fever. There is some mild redness or dark discoloration.  She is also complaining of some dizziness. She is not that mobile and uses a wheelchair to get around at home and she lives with her granddaughter who is not here with her.  Denies headache. Denies confusion altered mental status. Denies focal weakness or numbness. No chest pain or trouble breathing. No abdominal pain or vomiting or diarrhea.      Past Medical History:  Diagnosis Date  . Arthritis   . Collagen vascular disease ([place])   . Deep venous thrombosis (HCC)    right lower extremity  . Dependent edema   . History of bilateral hip replacements   . History of hysterectomy   . Hypertension   . Polycythemia vera ([place])   . Stroke [facility])    Pt reports she was told she had a stroke in may    Patient Active Problem List   Diagnosis Date Noted  . Influenza with respiratory manifestation 12/01/2016  . Respiratory distress 11/23/2016  . Vision loss of right eye 08/26/2016  . Blindness of right eye 08/26/2016  . Cannot walk 10/12/2015  . Presence of colostomy ([place]) 10/04/2015  . Long term current use of anticoagulant 10/03/2015  . Pulmonary embolism ([place]) 10/03/2015  . Leukocytosis 09/25/2015  . Chest pain 09/25/2015  . COPD exacerbation ([place]) 09/24/2015  . Right lower lobe pneumonia ([place]) 09/24/2015  . Chronic obstructive pulmonary disease with acute  exacerbation ([place]) 09/24/2015  . Dyspnea 09/22/2015  . Elevated troponin 09/15/2015  . UTI (lower urinary tract infection) 07/16/2015  . Abdominal wall cellulitis 07/15/2015  . DVT (deep venous thrombosis) ([place]) 07/15/2015  . HTN (hypertension) 07/15/2015  . Polycythemia vera ([place]) 07/15/2015  . Dependent edema 07/15/2015  . Arthritis 07/15/2015  . Pulmonary emboli ([place]) 05/25/2015  . Diverticulosis of colon with hemorrhage 04/02/2015  . Diverticula of intestine 04/02/2015  . Dehydration, moderate 01/11/2015  . Fall 01/10/2015  . BP (high blood pressure) 01/10/2015  . Hammer toe 09/02/2012  . History of amputation of foot ([place]) 09/02/2012  . Fungal infection of nail 09/02/2012  . Disease of skin and subcutaneous tissue 09/02/2012  . Pain in toe 09/02/2012  . Arthralgia of hip 08/23/2012  . History of repair of hip joint 08/23/2012  . Glenohumeral arthritis 06/28/2012    Past Surgical History:  Procedure Laterality Date  . ABDOMINAL HYSTERECTOMY    . CARDIAC CATHETERIZATION Right 04/03/2015   Procedure: CENTRAL LINE INSERTION;  Surgeon: Sherri Rad, MD;  Location: [facility] ORS;  Service: General;  Laterality: Right;  . COLECTOMY WITH COLOSTOMY CREATION/HARTMANN PROCEDURE N/A 04/03/2015   Procedure: COLECTOMY WITH COLOSTOMY CREATION/HARTMANN PROCEDURE;  Surgeon: Sherri Rad, MD;  Location: [facility] ORS;  Service: General;  Laterality: N/A;  . JOINT REPLACEMENT     Left and Right Hip  . PERIPHERAL VASCULAR CATHETERIZATION N/A 04/02/2015   Procedure: Visceral Angiography;  Surgeon: Algernon Huxley, MD;  Location: [place] CV LAB;  Service:  Cardiovascular;  Laterality: N/A;  . PERIPHERAL VASCULAR CATHETERIZATION N/A 04/02/2015   Procedure: Visceral Artery Intervention;  Surgeon: Algernon Huxley, MD;  Location: [place] CV LAB;  Service: Cardiovascular;  Laterality: N/A;  . PERIPHERAL VASCULAR CATHETERIZATION N/A 05/25/2015   Procedure: IVC Filter Insertion;  Surgeon: Katha Cabal, MD;   Location: [place] CV LAB;  Service: Cardiovascular;  Laterality: N/A;  . TOE AMPUTATION     right    Prior to Admission medications   Medication Sig Start Date End Date Taking? Authorizing Provider  aspirin EC 81 MG tablet Take 1 tablet (81 mg total) by mouth daily. 09/16/15  Yes [name], MD  docusate sodium (COLACE) 100 MG capsule Take 100 mg by mouth 2 (two) times daily as needed for mild constipation.    Yes Historical Provider, MD  folic acid (FOLVITE) 867 MCG tablet Take 400 mcg by mouth daily.   Yes Historical Provider, MD  hydroxyurea (HYDREA) 500 MG capsule Take  4 tablets daily 02/12/17  Yes Lequita Asal, MD  ipratropium-albuterol (DUONEB) 0.5-2.5 (3) MG/3ML SOLN Take 3 mLs by nebulization every 6 (six) hours as needed. 11/29/16  Yes Epifanio Lesches, MD  metoprolol tartrate (LOPRESSOR) 25 MG tablet Take 12.5 mg by mouth 2 (two) times daily.   Yes Historical Provider, MD  Polyethylene Glycol POWD Take 17 g by mouth daily. 11/22/15  Yes Clayburn Pert, MD  potassium chloride SA (K-DUR,KLOR-CON) 20 MEQ tablet Take 1 tablet (20 mEq total) by mouth 2 (two) times daily. 07/20/15  Yes Vaughan Basta, MD  warfarin (COUMADIN) 4 MG tablet Take 4 mg by mouth daily.    Yes Historical Provider, MD  menthol-cetylpyridinium (CEPACOL) 3 MG lozenge Take 1 lozenge (3 mg total) by mouth as needed for sore throat. Patient not taking: Reported on 11/25/2016 09/25/15   Theodoro Grist, MD    No Known Allergies  Family History  Problem Relation Age of Onset  . Heart attack Father   . COPD Father   . Hypertension    . Arthritis-Osteo    . Diabetes Brother   . Osteosarcoma Son     Social History Social History  Substance Use Topics  . Smoking status: Never Smoker  . Smokeless tobacco: Never Used  . Alcohol use No    Review of Systems  Constitutional: Negative for fever. Eyes: Negative for visual changes. ENT: Negative for sore throat. Cardiovascular: Negative for  chest pain. Respiratory: Negative for shortness of breath. Gastrointestinal: Negative for abdominal pain, vomiting and diarrhea. Genitourinary: Negative for dysuria. Musculoskeletal: Negative for back pain. Skin: Right facial skin rash. Neurological: Negative for headache. 10 point Review of Systems otherwise negative ____________________________________________   PHYSICAL EXAM:  VITAL SIGNS: ED Triage Vitals  Enc Vitals Group     BP 02/23/17 1136 127/68     Pulse Rate 02/23/17 1136 84     Resp 02/23/17 1136 (!) 21     Temp 02/23/17 1136 98.2 F (36.8 C)     Temp Source 02/23/17 1136 Oral     SpO2 02/23/17 1136 97 %     Weight 02/23/17 1138 224 lb 6.9 oz (101.8 kg)     Height 02/23/17 1138 5\' 7"  (1.702 m)     Head Circumference --      Peak Flow --      Pain Score --      Pain Loc --      Pain Edu? --      Excl. in [place]? --  Constitutional: Alert and oriented. Well appearing and in no distress. HEENT   Head: Normocephalic and atraumatic.  Right sided facial rash that is dry with small bumps that almost look like activity bumps, but there is also a chronic skin inflammatory appearance as well that extends from the hairline across midline but down the right side of the face and down into the right side of the neck.      Eyes: Conjunctivae are normal. PERRL. Normal extraocular movements.      Ears:         Nose: No congestion/rhinnorhea.   Mouth/Throat: Mucous membranes are moist.   Neck: No stridor. Cardiovascular/Chest: Normal rate, regular rhythm.  No murmurs, rubs, or gallops. Respiratory: Normal respiratory effort without tachypnea nor retractions. Breath sounds are clear and equal bilaterally. No wheezes/rales/rhonchi. Gastrointestinal: Soft. No distention, no guarding, no rebound. Nontender.    Genitourinary/rectal:Deferred Musculoskeletal: Nontender with normal range of motion in all extremities. No joint effusions.  No lower extremity tenderness.  No  edema. Neurologic:  Normal speech and language. No gross or focal neurologic deficits are appreciated. Skin:  Skin is warm, dry and intact.  See above. Psychiatric: Mood and affect are normal. Speech and behavior are normal. Patient exhibits appropriate insight and judgment.   ____________________________________________  LABS (pertinent positives/negatives)  Labs Reviewed  BASIC METABOLIC PANEL - Abnormal; Notable for the following:       Result Value   Calcium 8.6 (*)    All other components within normal limits  CBC - Abnormal; Notable for the following:    WBC 2.5 (*)    RDW 36.5 (*)    Platelets 785 (*)    All other components within normal limits  URINALYSIS, COMPLETE (UACMP) WITH MICROSCOPIC - Abnormal; Notable for the following:    Color, Urine YELLOW (*)    APPearance CLOUDY (*)    Nitrite POSITIVE (*)    Leukocytes, UA TRACE (*)    Bacteria, UA FEW (*)    Squamous Epithelial / LPF 0-5 (*)    All other components within normal limits  CULTURE, BLOOD (ROUTINE X 2)  CULTURE, BLOOD (ROUTINE X 2)  URINE CULTURE    ____________________________________________    EKG I, Lisa Roca, MD, the attending physician have personally viewed and interpreted all ECGs.  74 bpm. Left axis deviation. Nonspecific intraventricular conduction delay. Nonspecific ST and T-wave. ____________________________________________  RADIOLOGY All Xrays were viewed by me. Imaging interpreted by Radiologist.  None __________________________________________  PROCEDURES  Procedure(s) performed: None  Critical Care performed: None  ____________________________________________   ED COURSE / ASSESSMENT AND PLAN  Pertinent labs & imaging results that were available during my care of the patient were reviewed by me and considered in my medical decision making (see chart for details).   Ms. Gambrel is here mainly to ask for evaluation of her right-sided face rash. It looks very chronic  and she does state that it's been there for several months and she uses a topical cream. She states it was more itchy this week and which is why she came in for evaluation.  On exam it does not appear cellulitic or secondarily infected. Does not appear to be shingles. She is going to need to follow-up with a dermatologist for this.  In terms of the dizziness complaint, I did initiate a screening evaluation and she does have a nitrite positive UTI. She does not appear septic. She does not typically stand on her own anyway and did not completely orthostatics per standing.  In any case she feels like she can go home after antibiotic treatment here, and her granddaughter can pick up her antibiotics to  start tomorrow.    CONSULTATIONS:  None   Patient / Family / Caregiver informed of clinical course, medical decision-making process, and agree with plan.   I discussed return precautions, follow-up instructions, and discharge instructions with patient and/or family.  Discharge instructions:  You were evaluated for right-sided skin rash which is been there for quite some time, and your exam and evaluation are reassuring in the emergency department. You do need to follow-up with a dermatologist as an outpatient, please make an appointment.  You also found to have a urinary tract infection and were given a 24-hour antibiotic Rocephin in the emergency department today. Start your antibiotic tomorrow. Keflex rx.  Return to the emergency department immediately for any worsening condition including confusion or altered mental status, dizziness or passing out, abdominal pain, fever, vomiting, or any other symptoms concerning to you.  ___________________________________________   FINAL CLINICAL IMPRESSION(S) / ED DIAGNOSES   Final diagnoses:  Facial rash  Dizziness  Urinary tract infection without hematuria, site unspecified              Note: This dictation was prepared with Dragon  dictation. Any transcriptional errors that result from this process are unintentional    Lisa Roca, MD 02/23/17 [phone]

## 2017-02-23 NOTE — ED Notes (Signed)
Patient unable to stand due to being dizzy

## 2017-02-23 NOTE — ED Triage Notes (Signed)
Pt presents to ED c/o rash on her face that radiates to her eye. Pt states it does not hurt but itches. Has seen PCP and was prescribed medicine but rash has worsened. Pt with secondary concern of dizziness

## 2017-02-23 NOTE — ED Notes (Signed)
Pt left with EMS to home. Grand-daughter notified of discharge

## 2017-02-25 LAB — URINE CULTURE: Culture: >=100000 — AB

## 2017-02-26 ENCOUNTER — Telehealth: Payer: Self-pay | Admitting: *Deleted

## 2017-02-26 NOTE — Telephone Encounter (Signed)
Skilled nurse for home health ordered per Dr Mike Gip, Lorriane Shire informed via verbal order

## 2017-02-26 NOTE — Telephone Encounter (Signed)
  OK for order  M

## 2017-02-26 NOTE — Telephone Encounter (Signed)
Asking for orders for Skilled nursing due to rash on face and recent ER Visit for UTI and rash. Please fax order to them

## 2017-02-26 NOTE — Telephone Encounter (Signed)
Called [facility] and gave verbal order

## 2017-02-27 ENCOUNTER — Telehealth: Payer: Self-pay | Admitting: *Deleted

## 2017-02-27 ENCOUNTER — Inpatient Hospital Stay: Payer: Medicare Other | Attending: Hematology and Oncology

## 2017-02-27 DIAGNOSIS — Z7901 Long term (current) use of anticoagulants: Secondary | ICD-10-CM | POA: Diagnosis not present

## 2017-02-27 DIAGNOSIS — Z8673 Personal history of transient ischemic attack (TIA), and cerebral infarction without residual deficits: Secondary | ICD-10-CM | POA: Diagnosis not present

## 2017-02-27 DIAGNOSIS — H02009 Unspecified entropion of unspecified eye, unspecified eyelid: Secondary | ICD-10-CM | POA: Diagnosis not present

## 2017-02-27 DIAGNOSIS — Z7982 Long term (current) use of aspirin: Secondary | ICD-10-CM | POA: Diagnosis not present

## 2017-02-27 DIAGNOSIS — I10 Essential (primary) hypertension: Secondary | ICD-10-CM | POA: Insufficient documentation

## 2017-02-27 DIAGNOSIS — Z933 Colostomy status: Secondary | ICD-10-CM | POA: Diagnosis not present

## 2017-02-27 DIAGNOSIS — R21 Rash and other nonspecific skin eruption: Secondary | ICD-10-CM | POA: Insufficient documentation

## 2017-02-27 DIAGNOSIS — Z86711 Personal history of pulmonary embolism: Secondary | ICD-10-CM | POA: Insufficient documentation

## 2017-02-27 DIAGNOSIS — R600 Localized edema: Secondary | ICD-10-CM | POA: Diagnosis not present

## 2017-02-27 DIAGNOSIS — H5461 Unqualified visual loss, right eye, normal vision left eye: Secondary | ICD-10-CM | POA: Diagnosis not present

## 2017-02-27 DIAGNOSIS — Z882 Allergy status to sulfonamides status: Secondary | ICD-10-CM | POA: Diagnosis not present

## 2017-02-27 DIAGNOSIS — N390 Urinary tract infection, site not specified: Secondary | ICD-10-CM | POA: Diagnosis not present

## 2017-02-27 DIAGNOSIS — Z86718 Personal history of other venous thrombosis and embolism: Secondary | ICD-10-CM | POA: Insufficient documentation

## 2017-02-27 DIAGNOSIS — D45 Polycythemia vera: Secondary | ICD-10-CM | POA: Insufficient documentation

## 2017-02-27 DIAGNOSIS — Z792 Long term (current) use of antibiotics: Secondary | ICD-10-CM | POA: Diagnosis not present

## 2017-02-27 LAB — CBC WITH DIFFERENTIAL/PLATELET
Basophils Absolute: 0 10*3/uL (ref 0–0.1)
Basophils Relative: 1 %
Eosinophils Absolute: 0.1 10*3/uL (ref 0–0.7)
Eosinophils Relative: 2 %
HCT: 41.1 % (ref 35.0–47.0)
Hemoglobin: 13.5 g/dL (ref 12.0–16.0)
Lymphocytes Relative: 27 %
Lymphs Abs: 0.9 10*3/uL — ABNORMAL LOW (ref 1.0–3.6)
MCH: 28.6 pg (ref 26.0–34.0)
MCHC: 32.9 g/dL (ref 32.0–36.0)
MCV: 87 fL (ref 80.0–100.0)
Monocytes Absolute: 0.1 10*3/uL — ABNORMAL LOW (ref 0.2–0.9)
Monocytes Relative: 4 %
Neutro Abs: 2.1 10*3/uL (ref 1.4–6.5)
Neutrophils Relative %: 66 %
Platelets: 972 10*3/uL (ref 150–400)
RBC: 4.73 MIL/uL (ref 3.80–5.20)
RDW: 36.3 % — ABNORMAL HIGH (ref 11.5–14.5)
WBC: 3.3 10*3/uL — ABNORMAL LOW (ref 3.6–11.0)

## 2017-02-27 LAB — PROTIME-INR
INR: 2.05
Prothrombin Time: 23.4 seconds — ABNORMAL HIGH (ref 11.4–15.2)

## 2017-02-27 LAB — COMPREHENSIVE METABOLIC PANEL
ALT: 10 U/L — ABNORMAL LOW (ref 14–54)
AST: 23 U/L (ref 15–41)
Albumin: 3 g/dL — ABNORMAL LOW (ref 3.5–5.0)
Alkaline Phosphatase: 86 U/L (ref 38–126)
Anion gap: 5 (ref 5–15)
BUN: 11 mg/dL (ref 6–20)
CO2: 25 mmol/L (ref 22–32)
Calcium: 8.7 mg/dL — ABNORMAL LOW (ref 8.9–10.3)
Chloride: 106 mmol/L (ref 101–111)
Creatinine, Ser: 0.62 mg/dL (ref 0.44–1.00)
GFR calc Af Amer: >60 mL/min (ref >60)
GFR calc non Af Amer: >60 mL/min (ref >60)
Glucose, Bld: 137 mg/dL — ABNORMAL HIGH (ref 65–99)
Potassium: 4.2 mmol/L (ref 3.5–5.1)
Sodium: 136 mmol/L (ref 135–145)
Total Bilirubin: 0.8 mg/dL (ref 0.3–1.2)
Total Protein: 7.1 g/dL (ref 6.5–8.1)

## 2017-02-27 NOTE — Telephone Encounter (Signed)
Critical Lab - Platelets 960 Will have to perform estimate and then will enter in results in patient chart.  MD notified.

## 2017-02-27 NOTE — Telephone Encounter (Signed)
-----   Message from Lequita Asal, MD sent at 02/27/2017  1:10 PM EDT ----- Regarding: Please call patient  INR good. Continue current dose of Coumadin.  Find out current dose of hydrea and how long she has been on it. We will then call back with plans for adjustment.  M  ----- Message ----- From: Interface, Lab In [place] Sent: 02/27/2017  11:43 AM To: Lequita Asal, MD

## 2017-02-27 NOTE — Telephone Encounter (Signed)
Called patient and LVM to let her know INR is good.  Patient should continue current dose of Coumadin.  Also inquired as to dosage of hydrea and how long she has been taking that dose?  Ask her to call back and leave [place] a message.

## 2017-02-28 LAB — CULTURE, BLOOD (ROUTINE X 2)
Culture: NO GROWTH
Culture: NO GROWTH
SPECIAL REQUESTS: ADEQUATE
SPECIAL REQUESTS: ADEQUATE

## 2017-03-02 ENCOUNTER — Telehealth: Payer: Self-pay | Admitting: *Deleted

## 2017-03-02 NOTE — Telephone Encounter (Signed)
Attempted to call patient's granddaughter, [name] regarding her current hydrea dosage.  No answer and no availability to LVM.

## 2017-03-02 NOTE — Telephone Encounter (Signed)
Patient's granddaughter, [name], called back to inform me that patient is currently taking Hydrea 500 mg 4 times daily.  Dosage was changed in January.

## 2017-03-02 NOTE — Telephone Encounter (Signed)
-----   Message from Lequita Asal, MD sent at 02/27/2017  1:10 PM EDT ----- Regarding: Please call patient  INR good. Continue current dose of Coumadin.  Find out current dose of hydrea and how long she has been on it. We will then call back with plans for adjustment.  M  ----- Message ----- From: Interface, Lab In [place] Sent: 02/27/2017  11:43 AM To: Lequita Asal, MD

## 2017-03-06 ENCOUNTER — Inpatient Hospital Stay (HOSPITAL_BASED_OUTPATIENT_CLINIC_OR_DEPARTMENT_OTHER): Payer: Medicare Other | Admitting: Hematology and Oncology

## 2017-03-06 ENCOUNTER — Encounter: Payer: Self-pay | Admitting: Hematology and Oncology

## 2017-03-06 VITALS — BP 159/96 | HR 83 | Temp 97.6°F | Wt 224.0 lb

## 2017-03-06 DIAGNOSIS — Z792 Long term (current) use of antibiotics: Secondary | ICD-10-CM

## 2017-03-06 DIAGNOSIS — Z86718 Personal history of other venous thrombosis and embolism: Secondary | ICD-10-CM

## 2017-03-06 DIAGNOSIS — Z7982 Long term (current) use of aspirin: Secondary | ICD-10-CM

## 2017-03-06 DIAGNOSIS — I10 Essential (primary) hypertension: Secondary | ICD-10-CM

## 2017-03-06 DIAGNOSIS — D45 Polycythemia vera: Secondary | ICD-10-CM | POA: Diagnosis not present

## 2017-03-06 DIAGNOSIS — Z933 Colostomy status: Secondary | ICD-10-CM

## 2017-03-06 DIAGNOSIS — H02009 Unspecified entropion of unspecified eye, unspecified eyelid: Secondary | ICD-10-CM

## 2017-03-06 DIAGNOSIS — R21 Rash and other nonspecific skin eruption: Secondary | ICD-10-CM | POA: Diagnosis not present

## 2017-03-06 DIAGNOSIS — I82509 Chronic embolism and thrombosis of unspecified deep veins of unspecified lower extremity: Secondary | ICD-10-CM

## 2017-03-06 DIAGNOSIS — Z7901 Long term (current) use of anticoagulants: Secondary | ICD-10-CM

## 2017-03-06 DIAGNOSIS — N390 Urinary tract infection, site not specified: Secondary | ICD-10-CM

## 2017-03-06 DIAGNOSIS — Z8673 Personal history of transient ischemic attack (TIA), and cerebral infarction without residual deficits: Secondary | ICD-10-CM

## 2017-03-06 DIAGNOSIS — R600 Localized edema: Secondary | ICD-10-CM

## 2017-03-06 DIAGNOSIS — Z86711 Personal history of pulmonary embolism: Secondary | ICD-10-CM

## 2017-03-06 DIAGNOSIS — Z882 Allergy status to sulfonamides status: Secondary | ICD-10-CM

## 2017-03-06 DIAGNOSIS — H5461 Unqualified visual loss, right eye, normal vision left eye: Secondary | ICD-10-CM

## 2017-03-06 NOTE — Progress Notes (Signed)
[facility] day:  03/06/2017   Chief Complaint: Yolanda Wells is a 83 y.o. female with polycythemia rubra vera (PV) who is seen for 1 month assessment on hydroxyurea and Coumadin.  HPI:   The patient was last seen in the medical oncology clinic on 01/02/2017.  At that time, she denied any new symptoms.  From the patient's perspective, there was a discrepancy between what the facility had documented what she had received and what the patient believed she had received.  As her INR was low (1.85), her Coumadin will be increased from 28 mg/week to 31 mg a week ('5mg'$  3 days/week (spread out) and 4 mg 4 days/week).  CBC revealed a hematocrit of 41.2, hemoglobin 13.2, MCV 86.4, platelets 1.421 million, and WBC 3600 with an ANC of 2200.  She should have been taking 4 hydroxyurea pills/day.  CBC on 01/09/2017 revealed a hematocrit of 41.2, hemoglobin 13.6, MCV 83.5, platelets 1.034 million, and WBC 3700 with an ANC of 2500.  INR was 2.12.  Outside labs on 01/30/2017 revealed a hematocrit of 42.1, hemoglobin 13.1, MCV 85.9, platelets 839,000, WBC 2900 with an ANC of 1711.  INR was not performed as the tube clotted.  INR was 2.5 on 02/05/2017.  The patient was seen in the ER on Tuesday. She had a urinary tract infection. He was treated with antibiotics.    Labs on 02/27/2017 included a hematocrit of 41.1, hemoglobin 13.5, MCV 87, platelets 972,000, white count 3300 with an ANC of 2100.  Platelet count was 785,000 on 02/23/2017 (improving and prior to her infection).  INR was 2.05.  Symptomatically, she feels "pretty good". She moved in with her granddaughter on 02/06/2017. She is taking 4 hydroxyurea pills a day. She is taking Coumadin 4 mg at night. She has a new right facial rash which was recently biopsied. Her right eye surgery was canceled due to issues being off of Coumadin. She has some mild lower extremity swelling.   Past Medical History:  Diagnosis Date   . Arthritis   . Collagen vascular disease ([place])   . Deep venous thrombosis ([facility])    right lower extremity  . Dependent edema   . History of bilateral hip replacements   . History of hysterectomy   . Hypertension   . Polycythemia vera ([place])   . Stroke [facility])    Pt reports she was told she had a stroke in may    Past Surgical History:  Procedure Laterality Date  . ABDOMINAL HYSTERECTOMY    . CARDIAC CATHETERIZATION Right 04/03/2015   Procedure: CENTRAL LINE INSERTION;  Surgeon: Sherri Rad, MD;  Location: [facility] ORS;  Service: General;  Laterality: Right;  . COLECTOMY WITH COLOSTOMY CREATION/HARTMANN PROCEDURE N/A 04/03/2015   Procedure: COLECTOMY WITH COLOSTOMY CREATION/HARTMANN PROCEDURE;  Surgeon: Sherri Rad, MD;  Location: [facility] ORS;  Service: General;  Laterality: N/A;  . JOINT REPLACEMENT     Left and Right Hip  . PERIPHERAL VASCULAR CATHETERIZATION N/A 04/02/2015   Procedure: Visceral Angiography;  Surgeon: Algernon Huxley, MD;  Location: [place] [facility];  Service: Cardiovascular;  Laterality: N/A;  . PERIPHERAL VASCULAR CATHETERIZATION N/A 04/02/2015   Procedure: Visceral Artery Intervention;  Surgeon: Algernon Huxley, MD;  Location: [place] [facility];  Service: Cardiovascular;  Laterality: N/A;  . PERIPHERAL VASCULAR CATHETERIZATION N/A 05/25/2015   Procedure: IVC Filter Insertion;  Surgeon: Katha Cabal, MD;  Location: [place] [facility];  Service: Cardiovascular;  Laterality: N/A;  .  TOE AMPUTATION     right    Family History  Problem Relation Age of Onset  . Heart attack Father   . COPD Father   . Hypertension    . Arthritis-Osteo    . Diabetes Brother   . Osteosarcoma Son     Social History:  reports that she has never smoked. She has never used smokeless tobacco. She reports that she does not drink alcohol or use drugs.  She lives alone on [address] at [facility] (independent living).  She moved out of [facility].  She has been living with her grand daughter  since 02/06/2017.  Her grand-daughter visits regularly Yolanda Wells [phone]).  The patient is alone today.   Allergies:  Allergies  Allergen Reactions  . Sulfamethoxazole-Trimethoprim Other (See Comments) and Nausea And Vomiting    Current Medications: Current Outpatient Prescriptions  Medication Sig Dispense Refill  . aspirin EC 81 MG tablet Take 1 tablet (81 mg total) by mouth daily. 30 tablet 2  . docusate sodium (COLACE) 100 MG capsule Take 100 mg by mouth 2 (two) times daily as needed for mild constipation.     . folic acid (FOLVITE) 098 MCG tablet Take 400 mcg by mouth daily.    . hydroxyurea (HYDREA) 500 MG capsule Take  4 tablets daily 120 capsule 1  . menthol-cetylpyridinium (CEPACOL) 3 MG lozenge Take 1 lozenge (3 mg total) by mouth as needed for sore throat. 100 tablet 12  . metoprolol tartrate (LOPRESSOR) 25 MG tablet Take 12.5 mg by mouth 2 (two) times daily.    . Polyethylene Glycol POWD Take 17 g by mouth daily. 527 g 5  . potassium chloride SA (K-DUR,KLOR-CON) 20 MEQ tablet Take 1 tablet (20 mEq total) by mouth 2 (two) times daily. 30 tablet 0  . warfarin (COUMADIN) 4 MG tablet Take 4 mg by mouth daily.     [name] ipratropium-albuterol (DUONEB) 0.5-2.5 (3) MG/3ML SOLN Take 3 mLs by nebulization every 6 (six) hours as needed. (Patient not taking: Reported on 03/06/2017) 3 mL 30   No current facility-administered medications for this visit.     Review of Systems:  GENERAL:  Feels "ok".  No fevers or sweats.  Denies weight loss. PERFORMANCE STATUS (ECOG):  3 HEENT:  Decreased vision in right eye s/p embolic event.  Awaiting entropion surgery.  No runny nose, sore throat, mouth sores or tenderness. Lungs:  No shortness of breath or cough.  No hemoptysis. Cardiac:  No chest pain, palpitations, orthopnea, or PND. GI:  No nausea, vomiting, diarrhea, constipation, melena or hematochezia.  Interval blood in stool, resolved. GU:  Interval UTI.  No urgency, frequency, dysuria, or  hematuria.  Wears adult diapers. Musculoskeletal:  Knee problems ("bone on bone").  No muscle tenderness. Extremities:  Chronic swelling in legs. Skin:  Dry skin.  Right facial rash s/p biopsy. Neuro:  No headache, numbness or weakness, balance or coordination issues. Endocrine:  No diabetes, thyroid issues, hot flashes or night sweats. Psych:  No mood changes, depression or anxiety. Pain:  No focal pain. Review of systems:  All other systems reviewed and found to be negative.  Physical Exam: Blood pressure (!) 159/96, pulse 83, temperature 97.6 F (36.4 C), temperature source Tympanic, weight 224 lb (101.6 kg). GENERAL:  Elderly woman sitting comfortably in a wheelchair in the exam room in no acute distress. MENTAL STATUS:  Alert and oriented to person, place and time. HEAD: Pearline Cables hair.  Normocephalic, atraumatic, face symmetric, no Cushingoid features. EYES:  Brown eyes.  Right sided entropion.  Pupils equal round and reactive to light and accomodation.  No conjunctivitis or scleral icterus. ENT:  Oropharynx clear without lesion.  Upper dentures.  Tongue normal. Mucous membranes moist.  RESPIRATORY:  Clear to auscultation without rales, wheezes or rhonchi. CARDIOVASCULAR:  Regular rate and rhythm without murmur, rub or gallop. ABDOMEN:  Left sided colostomy.  Stool brown.  Soft, non-tender, with active bowel sounds, and no appreciable hepatosplenomegaly.  No masses. SKIN:  Right side of face thickened and dark.  Small suture s/p biopsy.  No ulcers . EXTREMITIES:  Chronic lower extremity changes.  No skin discoloration or tenderness.  No palpable cords. LYMPH NODES: No palpable cervical, supraclavicular, axillary or inguinal adenopathy  NEUROLOGICAL: Unremarkable. PSYCH:  Appropriate.   No visits with results within 3 Day(s) from this visit.  Latest known visit with results is:  Appointment on 02/27/2017  Component Date Value Ref Range Status  . Sodium 02/27/2017 136  135 - 145 mmol/L  Final  . Potassium 02/27/2017 4.2  3.5 - 5.1 mmol/L Final  . Chloride 02/27/2017 106  101 - 111 mmol/L Final  . CO2 02/27/2017 25  22 - 32 mmol/L Final  . Glucose, Bld 02/27/2017 137* 65 - 99 mg/dL Final  . BUN 02/27/2017 11  6 - 20 mg/dL Final  . Creatinine, Ser 02/27/2017 0.62  0.44 - 1.00 mg/dL Final  . Calcium 02/27/2017 8.7* 8.9 - 10.3 mg/dL Final  . Total Protein 02/27/2017 7.1  6.5 - 8.1 g/dL Final  . Albumin 02/27/2017 3.0* 3.5 - 5.0 g/dL Final  . AST 02/27/2017 23  15 - 41 U/L Final  . ALT 02/27/2017 10* 14 - 54 U/L Final  . Alkaline Phosphatase 02/27/2017 86  38 - 126 U/L Final  . Total Bilirubin 02/27/2017 0.8  0.3 - 1.2 mg/dL Final  . GFR calc non Af Amer 02/27/2017 >60  >60 mL/min Final  . GFR calc Af Amer 02/27/2017 >60  >60 mL/min Final   Comment: (NOTE) The eGFR has been calculated using the CKD EPI equation. This calculation has not been validated in all clinical situations. eGFR's persistently <60 mL/min signify possible Chronic Kidney Disease.   . Anion gap 02/27/2017 5  5 - 15 Final  . WBC 02/27/2017 3.3* 3.6 - 11.0 K/uL Final  . RBC 02/27/2017 4.73  3.80 - 5.20 MIL/uL Final  . Hemoglobin 02/27/2017 13.5  12.0 - 16.0 g/dL Final  . HCT 02/27/2017 41.1  35.0 - 47.0 % Final  . MCV 02/27/2017 87.0  80.0 - 100.0 fL Final  . MCH 02/27/2017 28.6  26.0 - 34.0 pg Final  . MCHC 02/27/2017 32.9  32.0 - 36.0 g/dL Final  . RDW 02/27/2017 36.3* 11.5 - 14.5 % Final  . Platelets 02/27/2017 972* 150 - 400 K/uL Final   Comment: RESULT REPEATED AND VERIFIED PLATELET COUNT CONFIRMED BY SMEAR LARGE PLATELETS PRESENT RBV ANITA BLACK '@11'$ :43AM 02/27/2017 LGR   . Neutrophils Relative % 02/27/2017 66  % Final  . Neutro Abs 02/27/2017 2.1  1.4 - 6.5 K/uL Final  . Lymphocytes Relative 02/27/2017 27  % Final  . Lymphs Abs 02/27/2017 0.9* 1.0 - 3.6 K/uL Final  . Monocytes Relative 02/27/2017 4  % Final  . Monocytes Absolute 02/27/2017 0.1* 0.2 - 0.9 K/uL Final  . Eosinophils Relative  02/27/2017 2  % Final  . Eosinophils Absolute 02/27/2017 0.1  0 - 0.7 K/uL Final  . Basophils Relative 02/27/2017 1  % Final  .  Basophils Absolute 02/27/2017 0.0  0 - 0.1 K/uL Final  . Prothrombin Time 02/27/2017 23.4* 11.4 - 15.2 seconds Final  . INR 02/27/2017 2.05   Final    Assessment:  Yolanda Wells is a 83 y.o. female African-American woman with JAK2+ polycythemia rubra vera (PV) previously on a phlebotomy program and hydroxyurea. She received P32 in an attempt to manage her counts in 03/2015.   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 She was diagnosed with bilateral lower extremity DVTs on 05/18/2015 and bilateral pulmonary emboli on 05/24/2015. She underwent IVC filter placement on 05/25/2015.  She has been on a fluctuating dose of Coumadin secondary to unstable INR.  She is on a baby aspirin.  She developed progressive erythrocytosis, thrombocytosis, and leukocytosis.  She underwent phlebotomy for a hematocrit of 55.4 on 09/23/2015.  Hematocrit decreased to 50.0, but has again increased after initiation of oral iron.  Platelet count has increased from 1.1 million to 1.4 million.  White count increased from 23,000 - 28,000 to 31,800.  She was admitted on 08/25/2016 with loss of vision in her right eye.  CBC on 08/25/2016 revealed a hematocrit of 40.0, hemoglobin 13.5, MCV 91, platelets 842,000, WBC 4900.  INR was 2.59.  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  MRI and MRA showed no acute changes.  She has been back on hydroxyurea since 10/02/2015.  Initial dose was 1000 mg a day.  She is currently taking 4 pills 3 days/week and 3 pills 4 days/week (total weekly dose: 24 pills).   She requires periodic phlebotomies (goal hematocrit <= 42).  Platelet count remains elevated (secondary to PV and likely some component of iron deficiency).  Goal  platelet count is 400,000.  She is on Coumadin 4 mg a day (total weekly dose 28 mg).  INR goal 2-3.  Symptomatically, she feels "good". She is taking hydroxyurea 2 gm/day (28 pills/week).  Exam reveals chronic right sided entropion.  Hematocrit was 41.1.  Platelet count was 972,000 (during an active UTI).  INR was 2.05 on 02/27/2017.   Plan: 1.  Discuss interval events. Discuss recent counts. The platelet count had been going down on the increased dose of hydroxyurea (4 pills a day). Suspect counts from last week reflect interval infection. Patient declines follow-up counts today and wishes to have them drawn next week. Goal platelet count remains 400,000.  2.  No phelbotomy today.  3.  Continue hydroxyurea 4 pills/day (28 pills per week).  4.  Continue Coumadin 4 mg/daily. 5.  Phone contact with Dr. Sterling Big ([phone]). 6.  RTC in 1 week for labs (CBC with diff, PT/INR). 7.  RTC in 1 month for MD assessment, labs (CBC with diff, PT/INR,  and +/- phlebotomy.   Lequita Asal, MD  03/06/2017, 12:21 PM

## 2017-03-06 NOTE — Progress Notes (Signed)
Patient denies pain or discomfort at this time, vitals stable and documented

## 2017-03-09 ENCOUNTER — Telehealth: Payer: Self-pay | Admitting: *Deleted

## 2017-03-09 NOTE — Telephone Encounter (Signed)
Would like a verbal order for social work evaluation.  Patient is having difficulty transitioning in her home.  Please contact Lorriane Shire @ [phone].

## 2017-03-10 HISTORY — PX: BLEPHAROPLASTY: SUR158

## 2017-03-10 NOTE — Telephone Encounter (Signed)
Called and informed Yolanda Wells at home health that it was ok for social work evaluation

## 2017-03-10 NOTE — Telephone Encounter (Signed)
  OK for social work evaluation.  M

## 2017-03-11 ENCOUNTER — Telehealth: Payer: Self-pay | Admitting: *Deleted

## 2017-03-11 ENCOUNTER — Other Ambulatory Visit: Payer: Self-pay | Admitting: *Deleted

## 2017-03-11 DIAGNOSIS — D45 Polycythemia vera: Secondary | ICD-10-CM

## 2017-03-11 NOTE — Telephone Encounter (Signed)
  Plan to check INR weekly in the short term.  M

## 2017-03-11 NOTE — Telephone Encounter (Signed)
Dr. Jennette Banker called to let us know that patient has been put on Lamisil for 6 weeks.  It can affect her INR. Coumadin dose may need to be adjusted.  Patient is scheduled for labs on Friday, May 4.

## 2017-03-11 NOTE — Telephone Encounter (Signed)
Erroneous entry

## 2017-03-13 ENCOUNTER — Telehealth: Payer: Self-pay | Admitting: *Deleted

## 2017-03-13 ENCOUNTER — Inpatient Hospital Stay: Payer: Medicare Other | Attending: Hematology and Oncology

## 2017-03-13 ENCOUNTER — Other Ambulatory Visit: Payer: Self-pay

## 2017-03-13 DIAGNOSIS — Z96643 Presence of artificial hip joint, bilateral: Secondary | ICD-10-CM | POA: Insufficient documentation

## 2017-03-13 DIAGNOSIS — Z86711 Personal history of pulmonary embolism: Secondary | ICD-10-CM | POA: Insufficient documentation

## 2017-03-13 DIAGNOSIS — Z79899 Other long term (current) drug therapy: Secondary | ICD-10-CM | POA: Insufficient documentation

## 2017-03-13 DIAGNOSIS — Z7901 Long term (current) use of anticoagulants: Secondary | ICD-10-CM | POA: Diagnosis not present

## 2017-03-13 DIAGNOSIS — D473 Essential (hemorrhagic) thrombocythemia: Secondary | ICD-10-CM | POA: Diagnosis not present

## 2017-03-13 DIAGNOSIS — H02009 Unspecified entropion of unspecified eye, unspecified eyelid: Secondary | ICD-10-CM | POA: Insufficient documentation

## 2017-03-13 DIAGNOSIS — D45 Polycythemia vera: Secondary | ICD-10-CM

## 2017-03-13 DIAGNOSIS — Z5181 Encounter for therapeutic drug level monitoring: Secondary | ICD-10-CM | POA: Diagnosis not present

## 2017-03-13 DIAGNOSIS — Z8673 Personal history of transient ischemic attack (TIA), and cerebral infarction without residual deficits: Secondary | ICD-10-CM | POA: Insufficient documentation

## 2017-03-13 DIAGNOSIS — D72829 Elevated white blood cell count, unspecified: Secondary | ICD-10-CM | POA: Diagnosis not present

## 2017-03-13 DIAGNOSIS — Z7982 Long term (current) use of aspirin: Secondary | ICD-10-CM | POA: Diagnosis not present

## 2017-03-13 DIAGNOSIS — I10 Essential (primary) hypertension: Secondary | ICD-10-CM | POA: Insufficient documentation

## 2017-03-13 DIAGNOSIS — I82509 Chronic embolism and thrombosis of unspecified deep veins of unspecified lower extremity: Secondary | ICD-10-CM

## 2017-03-13 DIAGNOSIS — Z86718 Personal history of other venous thrombosis and embolism: Secondary | ICD-10-CM | POA: Diagnosis not present

## 2017-03-13 LAB — CBC WITH DIFFERENTIAL/PLATELET
Basophils Absolute: 0 10*3/uL (ref 0–0.1)
Basophils Relative: 0 %
Eosinophils Absolute: 0.1 10*3/uL (ref 0–0.7)
Eosinophils Relative: 3 %
HCT: 43.1 % (ref 35.0–47.0)
Hemoglobin: 14.2 g/dL (ref 12.0–16.0)
Lymphocytes Relative: 34 %
Lymphs Abs: 1 10*3/uL (ref 1.0–3.6)
MCH: 28.8 pg (ref 26.0–34.0)
MCHC: 32.9 g/dL (ref 32.0–36.0)
MCV: 87.5 fL (ref 80.0–100.0)
Monocytes Absolute: 0.2 10*3/uL (ref 0.2–0.9)
Monocytes Relative: 6 %
Neutro Abs: 1.6 10*3/uL (ref 1.4–6.5)
Neutrophils Relative %: 57 %
Platelets: 1069 10*3/uL (ref 150–400)
RBC: 4.93 MIL/uL (ref 3.80–5.20)
RDW: 34.9 % — ABNORMAL HIGH (ref 11.5–14.5)
WBC: 2.9 10*3/uL — ABNORMAL LOW (ref 3.6–11.0)

## 2017-03-13 LAB — PROTIME-INR
INR: 1.7
Prothrombin Time: 20.2 seconds — ABNORMAL HIGH (ref 11.4–15.2)

## 2017-03-13 MED ORDER — WARFARIN SODIUM 4 MG PO TABS: 4.0000 mg | ORAL_TABLET | Freq: Every day | ORAL | 3 refills | Status: DC

## 2017-03-13 MED ORDER — WARFARIN SODIUM 5 MG PO TABS: 5.0000 mg | ORAL_TABLET | Freq: Every day | ORAL | 3 refills | Status: DC

## 2017-03-13 NOTE — Telephone Encounter (Signed)
Instructed granddaughter Yolanda Wells that the coumadin should be increased to 5 mg 3 days a week on mon, wed, fri and 4 mg 4 days a week on tues, thurs, sat, sun, voiced understanding.

## 2017-03-13 NOTE — Telephone Encounter (Signed)
Called and left bessie granddaughter a message to call back with coumadin dosing so that we could adjust her dose, awaiting call.

## 2017-03-13 NOTE — Telephone Encounter (Signed)
Critical Lab - Platelets 1069 MD notified.

## 2017-03-20 ENCOUNTER — Inpatient Hospital Stay: Payer: Medicare Other

## 2017-03-20 ENCOUNTER — Other Ambulatory Visit: Payer: Medicare Other

## 2017-03-20 ENCOUNTER — Telehealth: Payer: Self-pay | Admitting: Hematology and Oncology

## 2017-03-20 NOTE — Telephone Encounter (Signed)
Re:  P32  I spoke to the patient today after discussing her difficult to control counts with Dr. Sterling Big, her hematologist at [facility]. Dr Gaylyn Cheers recommended repeating P32 at [facility].  An order was being placed by Dr Gaylyn Cheers.  I spoke to the patient about the plan.  She adamantly refused to receive P32 as it made her "so sick" last time and she is "still getting over it".  She agreed to be seen in follow-up in the hematology clinic at [facility] to discuss further and to consider other options.  A message was sent to Dr. Gaylyn Cheers.  Lequita Asal, MD

## 2017-03-24 ENCOUNTER — Telehealth: Payer: Self-pay | Admitting: *Deleted

## 2017-03-24 NOTE — Telephone Encounter (Signed)
Called [name] and LVM that Dr. Mike Gip would like to speak to her regarding her grandmother.  Asked her to call us in our [place] on Wednesday.

## 2017-03-27 ENCOUNTER — Other Ambulatory Visit: Payer: Medicare Other

## 2017-03-27 ENCOUNTER — Inpatient Hospital Stay: Payer: Medicare Other

## 2017-03-30 ENCOUNTER — Telehealth: Payer: Self-pay | Admitting: *Deleted

## 2017-03-30 NOTE — Telephone Encounter (Signed)
-----   Message from Lequita Asal, MD sent at 03/24/2017  5:16 PM EDT ----- Regarding: Please saw grand neice/daughter  Dr Gaylyn Cheers would like her to receive P32.  Patient refusing.  M

## 2017-03-30 NOTE — Telephone Encounter (Signed)
Patient called and voice message left.  Patient did not call back per message.  Patient is coming to clinic on Thursday, May 24.

## 2017-04-01 ENCOUNTER — Telehealth: Payer: Self-pay | Admitting: *Deleted

## 2017-04-01 NOTE — Telephone Encounter (Signed)
Crystal called asking for order for nursing visit to draw labs for patient tomorrow, May 24.  Informed Crystal that patient has lab/MD appointment tomorrow at the [facility].

## 2017-04-02 ENCOUNTER — Inpatient Hospital Stay: Payer: Medicare Other

## 2017-04-02 ENCOUNTER — Telehealth: Payer: Self-pay | Admitting: *Deleted

## 2017-04-02 ENCOUNTER — Inpatient Hospital Stay: Payer: Medicare Other | Admitting: Hematology and Oncology

## 2017-04-02 DIAGNOSIS — D45 Polycythemia vera: Secondary | ICD-10-CM | POA: Diagnosis not present

## 2017-04-02 LAB — CBC WITH DIFFERENTIAL/PLATELET
Basophils Absolute: 0 10*3/uL (ref 0–0.1)
Basophils Relative: 1 %
Eosinophils Absolute: 0.1 10*3/uL (ref 0–0.7)
Eosinophils Relative: 2 %
HCT: 40.8 % (ref 35.0–47.0)
Hemoglobin: 13.8 g/dL (ref 12.0–16.0)
Lymphocytes Relative: 35 %
Lymphs Abs: 1.1 10*3/uL (ref 1.0–3.6)
MCH: 29.2 pg (ref 26.0–34.0)
MCHC: 33.8 g/dL (ref 32.0–36.0)
MCV: 86.3 fL (ref 80.0–100.0)
Monocytes Absolute: 0.1 10*3/uL — ABNORMAL LOW (ref 0.2–0.9)
Monocytes Relative: 3 %
Neutro Abs: 1.9 10*3/uL (ref 1.4–6.5)
Neutrophils Relative %: 59 %
Platelets: 963 10*3/uL (ref 150–400)
RBC: 4.73 MIL/uL (ref 3.80–5.20)
RDW: 35.8 % — ABNORMAL HIGH (ref 11.5–14.5)
WBC: 3.2 10*3/uL — ABNORMAL LOW (ref 3.6–11.0)

## 2017-04-02 LAB — COMPREHENSIVE METABOLIC PANEL
ALT: 8 U/L — ABNORMAL LOW (ref 14–54)
AST: 17 U/L (ref 15–41)
Albumin: 3.1 g/dL — ABNORMAL LOW (ref 3.5–5.0)
Alkaline Phosphatase: 71 U/L (ref 38–126)
Anion gap: 4 — ABNORMAL LOW (ref 5–15)
BUN: 12 mg/dL (ref 6–20)
CO2: 25 mmol/L (ref 22–32)
Calcium: 9 mg/dL (ref 8.9–10.3)
Chloride: 106 mmol/L (ref 101–111)
Creatinine, Ser: 0.6 mg/dL (ref 0.44–1.00)
GFR calc Af Amer: >60 mL/min (ref >60)
GFR calc non Af Amer: >60 mL/min (ref >60)
Glucose, Bld: 100 mg/dL — ABNORMAL HIGH (ref 65–99)
Potassium: 4.1 mmol/L (ref 3.5–5.1)
Sodium: 135 mmol/L (ref 135–145)
Total Bilirubin: 0.7 mg/dL (ref 0.3–1.2)
Total Protein: 7.4 g/dL (ref 6.5–8.1)

## 2017-04-02 LAB — PROTIME-INR
INR: 2.24
Prothrombin Time: 25.2 seconds — ABNORMAL HIGH (ref 11.4–15.2)

## 2017-04-02 NOTE — Telephone Encounter (Signed)
Called patient to inform her that INR is good.  She should continue current dosage of coumadin.  Granddaughter, [name], was in the room on speaker and heard what I told her.  Verbalized understanding.

## 2017-04-02 NOTE — Progress Notes (Deleted)
[facility] day:  04/02/2017   Chief Complaint: Yolanda Wells is a 83 y.o. female with polycythemia rubra vera (PV) who is seen for 1 month assessment on hydroxyurea and Coumadin.  HPI:   The patient was last seen in the medical oncology clinic on 03/06/2017.  At that time,  she felt "good". She was taking hydroxyurea 2 gm/day (28 pills/week).  Exam revealed chronic right sided entropion.  Hematocrit was 41.1.  Platelet count was 972,000 (during an active UTI).  INR was 2.05 on 02/27/2017.   Platelet count on 03/13/2017 was 1.069 million.  Coumadin increased to 5 mg 3 days/week and 4 mg 4 days/week on 03/13/2017.  She was contacted regarding her counts on 03/20/2017.  Recommendations were for P32 treatment at [facility].  Patient declined.  She agreed to be seen in the hematology clinic with Dr. Gaylyn Cheers to discuss treatment.  Symptomatically, she feels "pretty good". She moved in with her granddaughter on 02/06/2017. She is taking 4 hydroxyurea pills a day. She is taking Coumadin 4 mg at night. She has a new right facial rash which was recently biopsied. Her right eye surgery was canceled due to issues being off of Coumadin. She has some mild lower extremity swelling.   Past Medical History:  Diagnosis Date  . Arthritis   . Collagen vascular disease ([place])   . Deep venous thrombosis (HCC)    right lower extremity  . Dependent edema   . History of bilateral hip replacements   . History of hysterectomy   . Hypertension   . Polycythemia vera ([place])   . Stroke [facility])    Pt reports she was told she had a stroke in may    Past Surgical History:  Procedure Laterality Date  . ABDOMINAL HYSTERECTOMY    . CARDIAC CATHETERIZATION Right 04/03/2015   Procedure: CENTRAL LINE INSERTION;  Surgeon: Sherri Rad, MD;  Location: [facility] ORS;  Service: General;  Laterality: Right;  . COLECTOMY WITH COLOSTOMY CREATION/HARTMANN PROCEDURE N/A 04/03/2015   Procedure: COLECTOMY WITH  COLOSTOMY CREATION/HARTMANN PROCEDURE;  Surgeon: Sherri Rad, MD;  Location: [facility] ORS;  Service: General;  Laterality: N/A;  . JOINT REPLACEMENT     Left and Right Hip  . PERIPHERAL VASCULAR CATHETERIZATION N/A 04/02/2015   Procedure: Visceral Angiography;  Surgeon: Algernon Huxley, MD;  Location: [place] CV LAB;  Service: Cardiovascular;  Laterality: N/A;  . PERIPHERAL VASCULAR CATHETERIZATION N/A 04/02/2015   Procedure: Visceral Artery Intervention;  Surgeon: Algernon Huxley, MD;  Location: [place] [facility] LAB;  Service: Cardiovascular;  Laterality: N/A;  . PERIPHERAL VASCULAR CATHETERIZATION N/A 05/25/2015   Procedure: IVC Filter Insertion;  Surgeon: Katha Cabal, MD;  Location: [place] CV LAB;  Service: Cardiovascular;  Laterality: N/A;  . TOE AMPUTATION     right    Family History  Problem Relation Age of Onset  . Heart attack Father   . COPD Father   . Hypertension Unknown   . Arthritis-Osteo Unknown   . Diabetes Brother   . Osteosarcoma Son     Social History:  reports that she has never smoked. She has never used smokeless tobacco. She reports that she does not drink alcohol or use drugs.  She lives alone on [address] at [facility] (independent living).  She moved out of [facility].  She has been living with her grand daughter since 02/06/2017.  Her grand-daughter visits regularly [name] [phone]).  The patient is alone today.  Allergies:  Allergies  Allergen Reactions  . Sulfamethoxazole-Trimethoprim Other (See Comments) and Nausea And Vomiting    Current Medications: Current Outpatient Prescriptions  Medication Sig Dispense Refill  . aspirin EC 81 MG tablet Take 1 tablet (81 mg total) by mouth daily. 30 tablet 2  . docusate sodium (COLACE) 100 MG capsule Take 100 mg by mouth 2 (two) times daily as needed for mild constipation.     . folic acid (FOLVITE) 300 MCG tablet Take 400 mcg by mouth daily.    . hydroxyurea (HYDREA) 500 MG capsule Take  4  tablets daily 120 capsule 1  . ipratropium-albuterol (DUONEB) 0.5-2.5 (3) MG/3ML SOLN Take 3 mLs by nebulization every 6 (six) hours as needed. (Patient not taking: Reported on 03/06/2017) 3 mL 30  . menthol-cetylpyridinium (CEPACOL) 3 MG lozenge Take 1 lozenge (3 mg total) by mouth as needed for sore throat. 100 tablet 12  . metoprolol tartrate (LOPRESSOR) 25 MG tablet Take 12.5 mg by mouth 2 (two) times daily.    . Polyethylene Glycol POWD Take 17 g by mouth daily. 527 g 5  . potassium chloride SA (K-DUR,KLOR-CON) 20 MEQ tablet Take 1 tablet (20 mEq total) by mouth 2 (two) times daily. 30 tablet 0  . warfarin (COUMADIN) 4 MG tablet Take 1 tablet (4 mg total) by mouth daily. 30 tablet 3  . warfarin (COUMADIN) 5 MG tablet Take 1 tablet (5 mg total) by mouth daily. 30 tablet 3   No current facility-administered medications for this visit.     Review of Systems:  GENERAL:  Feels "ok".  No fevers or sweats.  Denies weight loss. PERFORMANCE STATUS (ECOG):  3 HEENT:  Decreased vision in right eye s/p embolic event.  Awaiting entropion surgery.  No runny nose, sore throat, mouth sores or tenderness. Lungs:  No shortness of breath or cough.  No hemoptysis. Cardiac:  No chest pain, palpitations, orthopnea, or PND. GI:  No nausea, vomiting, diarrhea, constipation, melena or hematochezia.  Interval blood in stool, resolved. GU:  Interval UTI.  No urgency, frequency, dysuria, or hematuria.  Wears adult diapers. Musculoskeletal:  Knee problems ("bone on bone").  No muscle tenderness. Extremities:  Chronic swelling in legs. Skin:  Dry skin.  Right facial rash s/p biopsy. Neuro:  No headache, numbness or weakness, balance or coordination issues. Endocrine:  No diabetes, thyroid issues, hot flashes or night sweats. Psych:  No mood changes, depression or anxiety. Pain:  No focal pain. Review of systems:  All other systems reviewed and found to be negative.  Physical Exam: There were no vitals taken for  this visit. GENERAL:  Elderly woman sitting comfortably in a wheelchair in the exam room in no acute distress. MENTAL STATUS:  Alert and oriented to person, place and time. HEAD: Pearline Cables hair.  Normocephalic, atraumatic, face symmetric, no Cushingoid features. EYES:  Brown eyes.  Right sided entropion.  Pupils equal round and reactive to light and accomodation.  No conjunctivitis or scleral icterus. ENT:  Oropharynx clear without lesion.  Upper dentures.  Tongue normal. Mucous membranes moist.  RESPIRATORY:  Clear to auscultation without rales, wheezes or rhonchi. CARDIOVASCULAR:  Regular rate and rhythm without murmur, rub or gallop. ABDOMEN:  Left sided colostomy.  Stool brown.  Soft, non-tender, with active bowel sounds, and no appreciable hepatosplenomegaly.  No masses. SKIN:  Right side of face thickened and dark.  Small suture s/p biopsy.  No ulcers . EXTREMITIES:  Chronic lower extremity changes.  No skin discoloration or tenderness.  No palpable cords. LYMPH NODES: No palpable cervical, supraclavicular, axillary or inguinal adenopathy  NEUROLOGICAL: Unremarkable. PSYCH:  Appropriate.   No visits with results within 3 Day(s) from this visit.  Latest known visit with results is:  Orders Only on 03/13/2017  Component Date Value Ref Range Status  . WBC 03/13/2017 2.9* 3.6 - 11.0 K/uL Final  . RBC 03/13/2017 4.93  3.80 - 5.20 MIL/uL Final  . Hemoglobin 03/13/2017 14.2  12.0 - 16.0 g/dL Final  . HCT 03/13/2017 43.1  35.0 - 47.0 % Final  . MCV 03/13/2017 87.5  80.0 - 100.0 fL Final  . MCH 03/13/2017 28.8  26.0 - 34.0 pg Final  . MCHC 03/13/2017 32.9  32.0 - 36.0 g/dL Final  . RDW 03/13/2017 34.9* 11.5 - 14.5 % Final  . Platelets 03/13/2017 1069* 150 - 400 K/uL Final   Comment: PLATELET COUNT CONFIRMED BY SMEAR LARGE PLATELETS PRESENT CRITICAL RESULT CALLED TO, READ BACK BY AND VERIFIED WITH: ANITA BLACK @11 :47AM 03/13/2017 [facility] RESULT REPEATED AND VERIFIED   . Neutrophils Relative %  03/13/2017 57  % Final  . Neutro Abs 03/13/2017 1.6  1.4 - 6.5 K/uL Final  . Lymphocytes Relative 03/13/2017 34  % Final  . Lymphs Abs 03/13/2017 1.0  1.0 - 3.6 K/uL Final  . Monocytes Relative 03/13/2017 6  % Final  . Monocytes Absolute 03/13/2017 0.2  0.2 - 0.9 K/uL Final  . Eosinophils Relative 03/13/2017 3  % Final  . Eosinophils Absolute 03/13/2017 0.1  0 - 0.7 K/uL Final  . Basophils Relative 03/13/2017 0  % Final  . Basophils Absolute 03/13/2017 0.0  0 - 0.1 K/uL Final  . Prothrombin Time 03/13/2017 20.2* 11.4 - 15.2 seconds Final  . INR 03/13/2017 1.70   Final    Assessment:  Yolanda Wells is a 83 y.o. female African-American woman with JAK2+ polycythemia rubra vera (PV) previously on a phlebotomy program and hydroxyurea. She received P32 in an attempt to manage her counts in 03/2015.   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 She was diagnosed with bilateral lower extremity DVTs on 05/18/2015 and bilateral pulmonary emboli on 05/24/2015. She underwent IVC filter placement on 05/25/2015.  She has been on a fluctuating dose of Coumadin secondary to unstable INR.  She is on a baby aspirin.  She developed progressive erythrocytosis, thrombocytosis, and leukocytosis.  She underwent phlebotomy for a hematocrit of 55.4 on 09/23/2015.  Hematocrit decreased to 50.0, but has again increased after initiation of oral iron.  Platelet count has increased from 1.1 million to 1.4 million.  White count increased from 23,000 - 28,000 to 31,800.  She was admitted on 08/25/2016 with loss of vision in her right eye.  CBC on 08/25/2016 revealed a hematocrit of 40.0, hemoglobin 13.5, MCV 91, platelets 842,000, WBC 4900.  INR was 2.59.  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  MRI and MRA showed no acute changes.  She has been back on hydroxyurea since  10/02/2015.  Initial dose was 1000 mg a day.  She is currently taking 4 pills 3 days/week and 3 pills 4 days/week (total weekly dose: 24 pills).   She requires periodic phlebotomies (goal hematocrit <= 42).  Platelet count remains elevated (secondary to PV and likely some component of iron deficiency).  Goal platelet count is 400,000.  She is on Coumadin 4 mg a day (total weekly dose 28 mg).  INR  goal 2-3.  Symptomatically, she feels "good". She is taking hydroxyurea 2 gm/day (28 pills/week).  Exam reveals chronic right sided entropion.  Hematocrit was 41.1.  Platelet count was 972,000 (during an active UTI).  INR was 2.05 on 02/27/2017.   Plan: 1.  Discuss interval events. Discuss recent counts. The platelet count had been going down on the increased dose of hydroxyurea (4 pills a day). Suspect counts from last week reflect interval infection. Patient declines follow-up counts today and wishes to have them drawn next week. Goal platelet count remains 400,000.  2.  No phelbotomy today.  3.  Continue hydroxyurea 4 pills/day (28 pills per week).  4.  Continue Coumadin 4 mg/daily. 5.  Phone contact with Dr. Sterling Big ([phone]). 6.  RTC in 1 week for labs (CBC with diff, PT/INR). 7.  RTC in 1 month for MD assessment, labs (CBC with diff, PT/INR,  and +/- phlebotomy.   Lequita Asal, MD  04/02/2017, 5:40 AM

## 2017-04-02 NOTE — Telephone Encounter (Signed)
-----   Message from Lequita Asal, MD sent at 04/02/2017  3:48 PM EDT ----- Regarding: Please call patient  INR good. Continue current dose of Coumadin.   ----- Message ----- From: Interface, Lab In [place] Sent: 04/02/2017  12:06 PM To: Lequita Asal, MD

## 2017-04-03 ENCOUNTER — Ambulatory Visit: Payer: Medicare Other | Admitting: Hematology and Oncology

## 2017-04-03 ENCOUNTER — Other Ambulatory Visit: Payer: Medicare Other

## 2017-04-07 ENCOUNTER — Inpatient Hospital Stay (HOSPITAL_BASED_OUTPATIENT_CLINIC_OR_DEPARTMENT_OTHER): Payer: Medicare Other | Admitting: Hematology and Oncology

## 2017-04-07 ENCOUNTER — Other Ambulatory Visit: Payer: Self-pay | Admitting: *Deleted

## 2017-04-07 ENCOUNTER — Inpatient Hospital Stay: Payer: Medicare Other

## 2017-04-07 VITALS — BP 145/85 | HR 58 | Temp 97.7°F

## 2017-04-07 DIAGNOSIS — D45 Polycythemia vera: Secondary | ICD-10-CM

## 2017-04-07 DIAGNOSIS — Z5181 Encounter for therapeutic drug level monitoring: Secondary | ICD-10-CM | POA: Diagnosis not present

## 2017-04-07 DIAGNOSIS — I10 Essential (primary) hypertension: Secondary | ICD-10-CM

## 2017-04-07 DIAGNOSIS — Z7982 Long term (current) use of aspirin: Secondary | ICD-10-CM

## 2017-04-07 DIAGNOSIS — Z7901 Long term (current) use of anticoagulants: Secondary | ICD-10-CM

## 2017-04-07 DIAGNOSIS — Z79899 Other long term (current) drug therapy: Secondary | ICD-10-CM

## 2017-04-07 DIAGNOSIS — D473 Essential (hemorrhagic) thrombocythemia: Secondary | ICD-10-CM

## 2017-04-07 DIAGNOSIS — H02009 Unspecified entropion of unspecified eye, unspecified eyelid: Secondary | ICD-10-CM

## 2017-04-07 DIAGNOSIS — Z8673 Personal history of transient ischemic attack (TIA), and cerebral infarction without residual deficits: Secondary | ICD-10-CM

## 2017-04-07 DIAGNOSIS — D72829 Elevated white blood cell count, unspecified: Secondary | ICD-10-CM

## 2017-04-07 DIAGNOSIS — Z96643 Presence of artificial hip joint, bilateral: Secondary | ICD-10-CM

## 2017-04-07 DIAGNOSIS — Z86711 Personal history of pulmonary embolism: Secondary | ICD-10-CM

## 2017-04-07 DIAGNOSIS — Z86718 Personal history of other venous thrombosis and embolism: Secondary | ICD-10-CM

## 2017-04-07 NOTE — Progress Notes (Signed)
Patient here today regarding polycythemia vera.

## 2017-04-07 NOTE — Progress Notes (Signed)
[facility] day:  04/07/2017   Chief Complaint: Yolanda Wells is a 83 y.o. female with polycythemia rubra vera (PV) who is seen for 1 month assessment on hydroxyurea and Coumadin.  HPI:   The patient was last seen in the medical oncology clinic on 03/06/2017.  At that time,  she felt "good". She was taking hydroxyurea 2 gm/day (28 pills/week).  Exam revealed chronic right sided entropion.  Hematocrit was 41.1.  Platelet count was 972,000 (during an active UTI).  INR was 2.05 on 02/27/2017.   Platelet count on 03/13/2017 was 1.069 million.  Coumadin increased to 5 mg 3 days/week and 4 mg 4 days/week on 03/13/2017.  She was contacted regarding her counts on 03/20/2017.  Recommendations were for P32 treatment at [facility].  Patient declined.  She agreed to be seen in the hematology clinic with Dr. Gaylyn Cheers to discuss treatment.  She had right eye surgery 2 weeks ago.  She can see better.  She denies any pain.   Past Medical History:  Diagnosis Date  . Arthritis   . Collagen vascular disease ([place])   . Deep venous thrombosis (HCC)    right lower extremity  . Dependent edema   . History of bilateral hip replacements   . History of hysterectomy   . Hypertension   . Polycythemia vera ([place])   . Stroke [facility])    Pt reports she was told she had a stroke in may    Past Surgical History:  Procedure Laterality Date  . ABDOMINAL HYSTERECTOMY    . CARDIAC CATHETERIZATION Right 04/03/2015   Procedure: CENTRAL LINE INSERTION;  Surgeon: Sherri Rad, MD;  Location: [facility] ORS;  Service: General;  Laterality: Right;  . COLECTOMY WITH COLOSTOMY CREATION/HARTMANN PROCEDURE N/A 04/03/2015   Procedure: COLECTOMY WITH COLOSTOMY CREATION/HARTMANN PROCEDURE;  Surgeon: Sherri Rad, MD;  Location: [facility] ORS;  Service: General;  Laterality: N/A;  . JOINT REPLACEMENT     Left and Right Hip  . PERIPHERAL VASCULAR CATHETERIZATION N/A 04/02/2015   Procedure: Visceral Angiography;  Surgeon:  Algernon Huxley, MD;  Location: [place] CV LAB;  Service: Cardiovascular;  Laterality: N/A;  . PERIPHERAL VASCULAR CATHETERIZATION N/A 04/02/2015   Procedure: Visceral Artery Intervention;  Surgeon: Algernon Huxley, MD;  Location: [place] CV LAB;  Service: Cardiovascular;  Laterality: N/A;  . PERIPHERAL VASCULAR CATHETERIZATION N/A 05/25/2015   Procedure: IVC Filter Insertion;  Surgeon: Katha Cabal, MD;  Location: [place] CV LAB;  Service: Cardiovascular;  Laterality: N/A;  . TOE AMPUTATION     right    Family History  Problem Relation Age of Onset  . Heart attack Father   . COPD Father   . Hypertension Unknown   . Arthritis-Osteo Unknown   . Diabetes Brother   . Osteosarcoma Son     Social History:  reports that she has never smoked. She has never used smokeless tobacco. She reports that she does not drink alcohol or use drugs.  She lives alone on [address] at [facility] (independent living).  She moved out of [employer].  She has been living with her grand daughter since 02/06/2017.  Her grand-daughter visits regularly [name] [phone]).  The patient is accompanied by her grand daughter today.   Allergies:  Allergies  Allergen Reactions  . Sulfamethoxazole-Trimethoprim Other (See Comments) and Nausea And Vomiting    Current Medications: Current Outpatient Prescriptions  Medication Sig Dispense Refill  . aspirin EC 81 MG tablet Take  1 tablet (81 mg total) by mouth daily. 30 tablet 2  . docusate sodium (COLACE) 100 MG capsule Take 100 mg by mouth 2 (two) times daily as needed for mild constipation.     . folic acid (FOLVITE) 488 MCG tablet Take 400 mcg by mouth daily.    . hydroxyurea (HYDREA) 500 MG capsule Take  4 tablets daily 120 capsule 1  . menthol-cetylpyridinium (CEPACOL) 3 MG lozenge Take 1 lozenge (3 mg total) by mouth as needed for sore throat. 100 tablet 12  . metoprolol tartrate (LOPRESSOR) 25 MG tablet Take 12.5 mg by mouth 2 (two) times  daily.    . Polyethylene Glycol POWD Take 17 g by mouth daily. 527 g 5  . potassium chloride SA (K-DUR,KLOR-CON) 20 MEQ tablet Take 1 tablet (20 mEq total) by mouth 2 (two) times daily. 30 tablet 0  . warfarin (COUMADIN) 4 MG tablet Take 1 tablet (4 mg total) by mouth daily. 30 tablet 3  . warfarin (COUMADIN) 5 MG tablet Take 1 tablet (5 mg total) by mouth daily. 30 tablet 3  . ipratropium-albuterol (DUONEB) 0.5-2.5 (3) MG/3ML SOLN Take 3 mLs by nebulization every 6 (six) hours as needed. (Patient not taking: Reported on 03/06/2017) 3 mL 30   No current facility-administered medications for this visit.     Review of Systems:  GENERAL:  Feels "ok".  No fevers or sweats.  No weight loss. PERFORMANCE STATUS (ECOG):  3 HEENT:  Decreased vision in right eye s/p embolic event.  Seeing better s/p entropion surgery.  No runny nose, sore throat, mouth sores or tenderness. Lungs:  No shortness of breath or cough.  No hemoptysis. Cardiac:  No chest pain, palpitations, orthopnea, or PND. GI:  No nausea, vomiting, diarrhea, constipation, melena or hematochezia.  Interval blood in stool, resolved. GU:  No urgency, frequency, dysuria, or hematuria.  Wears adult diapers. Musculoskeletal:  Knee problems ("bone on bone").  No muscle tenderness. Extremities:  Chronic swelling in legs. Skin:  Dry skin. Neuro:  No headache, numbness or weakness, balance or coordination issues. Endocrine:  No diabetes, thyroid issues, hot flashes or night sweats. Psych:  No mood changes, depression or anxiety. Pain:  No focal pain. Review of systems:  All other systems reviewed and found to be negative.  Physical Exam: Blood pressure (!) 145/85, pulse (!) 58, temperature 97.7 F (36.5 C), temperature source Tympanic. GENERAL:  Elderly woman sitting comfortably in a wheelchair in the exam room in no acute distress. MENTAL STATUS:  Alert and oriented to person, place and time. HEAD: Pearline Cables hair.  Normocephalic, atraumatic, face  symmetric, no Cushingoid features. EYES:  Brown eyes.  Right sided entropion.  Pupils equal round and reactive to light and accomodation.  No conjunctivitis or scleral icterus. ENT:  Oropharynx clear without lesion.  Upper dentures.  Tongue normal. Mucous membranes moist.  RESPIRATORY:  Clear to auscultation without rales, wheezes or rhonchi. CARDIOVASCULAR:  Regular rate and rhythm without murmur, rub or gallop. ABDOMEN:  Left sided colostomy.  Stool brown.  Soft, non-tender, with active bowel sounds, and no appreciable hepatosplenomegaly.  No masses. SKIN:  Right side of face thickened and dark.  Small suture s/p biopsy.  No ulcers . EXTREMITIES:  Chronic lower extremity changes.  No skin discoloration or tenderness.  No palpable cords. LYMPH NODES: No palpable cervical, supraclavicular, axillary or inguinal adenopathy  NEUROLOGICAL: Unremarkable. PSYCH:  Appropriate.   No visits with results within 3 Day(s) from this visit.  Latest known visit with  results is:  Appointment on 04/02/2017  Component Date Value Ref Range Status  . WBC 04/02/2017 3.2* 3.6 - 11.0 K/uL Final  . RBC 04/02/2017 4.73  3.80 - 5.20 MIL/uL Final  . Hemoglobin 04/02/2017 13.8  12.0 - 16.0 g/dL Final  . HCT 04/02/2017 40.8  35.0 - 47.0 % Final  . MCV 04/02/2017 86.3  80.0 - 100.0 fL Final  . MCH 04/02/2017 29.2  26.0 - 34.0 pg Final  . MCHC 04/02/2017 33.8  32.0 - 36.0 g/dL Final  . RDW 04/02/2017 35.8* 11.5 - 14.5 % Final  . Platelets 04/02/2017 963* 150 - 400 K/uL Final   Comment: RESULT REPEATED AND VERIFIED CRITICAL RESULT CALLED TO, READ BACK BY AND VERIFIED WITH: JULIE EASTWOOD @ 12:00 04/02/2017 [facility] PLATELET COUNT CONFIRMED BY SMEAR PLATELETS APPEAR INCREASED PLATELETS VARY IN SIZE   . Neutrophils Relative % 04/02/2017 59  % Final  . Neutro Abs 04/02/2017 1.9  1.4 - 6.5 K/uL Final  . Lymphocytes Relative 04/02/2017 35  % Final  . Lymphs Abs 04/02/2017 1.1  1.0 - 3.6 K/uL Final  . Monocytes Relative  04/02/2017 3  % Final  . Monocytes Absolute 04/02/2017 0.1* 0.2 - 0.9 K/uL Final  . Eosinophils Relative 04/02/2017 2  % Final  . Eosinophils Absolute 04/02/2017 0.1  0 - 0.7 K/uL Final  . Basophils Relative 04/02/2017 1  % Final  . Basophils Absolute 04/02/2017 0.0  0 - 0.1 K/uL Final  . Sodium 04/02/2017 135  135 - 145 mmol/L Final  . Potassium 04/02/2017 4.1  3.5 - 5.1 mmol/L Final  . Chloride 04/02/2017 106  101 - 111 mmol/L Final  . CO2 04/02/2017 25  22 - 32 mmol/L Final  . Glucose, Bld 04/02/2017 100* 65 - 99 mg/dL Final  . BUN 04/02/2017 12  6 - 20 mg/dL Final  . Creatinine, Ser 04/02/2017 0.60  0.44 - 1.00 mg/dL Final  . Calcium 04/02/2017 9.0  8.9 - 10.3 mg/dL Final  . Total Protein 04/02/2017 7.4  6.5 - 8.1 g/dL Final  . Albumin 04/02/2017 3.1* 3.5 - 5.0 g/dL Final  . AST 04/02/2017 17  15 - 41 U/L Final  . ALT 04/02/2017 8* 14 - 54 U/L Final  . Alkaline Phosphatase 04/02/2017 71  38 - 126 U/L Final  . Total Bilirubin 04/02/2017 0.7  0.3 - 1.2 mg/dL Final  . GFR calc non Af Amer 04/02/2017 >60  >60 mL/min Final  . GFR calc Af Amer 04/02/2017 >60  >60 mL/min Final   Comment: (NOTE) The eGFR has been calculated using the CKD EPI equation. This calculation has not been validated in all clinical situations. eGFR's persistently <60 mL/min signify possible Chronic Kidney Disease.   . Anion gap 04/02/2017 4* 5 - 15 Final  . Prothrombin Time 04/02/2017 25.2* 11.4 - 15.2 seconds Final  . INR 04/02/2017 2.24   Final    Assessment:  Janita Camberos is a 83 y.o. female African-American woman with JAK2+ polycythemia rubra vera (PV) previously on a phlebotomy program and hydroxyurea. She received P32 in an attempt to manage her counts in 03/2015.   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 She was diagnosed with bilateral lower extremity DVTs on 05/18/2015 and bilateral pulmonary  emboli on 05/24/2015. She underwent IVC filter placement on 05/25/2015.  She has been on a fluctuating dose of Coumadin secondary to unstable INR.  She is on a baby aspirin.  She developed progressive erythrocytosis, thrombocytosis, and leukocytosis.  She underwent phlebotomy for a hematocrit of 55.4 on 09/23/2015.  Hematocrit decreased to 50.0, but has again increased after initiation of oral iron.  Platelet count has increased from 1.1 million to 1.4 million.  White count increased from 23,000 - 28,000 to 31,800.  She was admitted on 08/25/2016 with loss of vision in her right eye.  CBC on 08/25/2016 revealed a hematocrit of 40.0, hemoglobin 13.5, MCV 91, platelets 842,000, WBC 4900.  INR was 2.59.  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  MRI and MRA showed no acute changes.  She has been back on hydroxyurea since 10/02/2015.  Initial dose was 1000 mg a day.  She is currently taking 4 pills 3 days/week and 3 pills 4 days/week (total weekly dose: 24 pills).   She requires periodic phlebotomies (goal hematocrit <= 42).  Platelet count remains elevated (secondary to PV and likely some component of iron deficiency).  Goal platelet count is 400,000.  She is on Coumadin 5 mg 3 days/week and 4 mg 4 days/week (total weekly dose 31 mg).  INR goal 2-3.  Symptomatically, she feels "fine". She is taking hydroxyurea 2 gm/day (28 pills/week).  Exam reveals chronic right sided entropion.  Hematocrit was 40.8.  Platelet count was 963,000.  INR was 2.24.   Plan: 1.  Discuss recent trend in counts. The platelet count remains markedly elevated on of hydroxyurea (4 pills a day). Goal platelet count remains 400,000.  Discuss prior conversation with patient (grand daughter was not present at that time).  Discuss consideration of P32.  Discuss follow-up at [facility] with Dr. Gaylyn Cheers. 2.  No phlebotomy today.  3.  Continue hydroxyurea 4 pills/day (28 pills per week).  4.  Continue  Coumadin. 5.  Phone contact with Dr. Sterling Big ([phone]). 6.  Patient has appt on Fridays 06/01 and 06/08 for labs. 7.  Patient to schedule appt with Dr. Gaylyn Cheers at [facility]. 8.  RTC in 3 weeks for MD assessment and labs (CBC with diff, PT/INR).   Lequita Asal, MD  04/07/2017, 3:42 PM

## 2017-04-09 ENCOUNTER — Observation Stay
Admission: EM | Admit: 2017-04-09 | Discharge: 2017-04-11 | Disposition: A | Discharge status: Home / Self Care | Payer: Medicare Other | Attending: Internal Medicine | Admitting: Internal Medicine

## 2017-04-09 ENCOUNTER — Encounter: Payer: Self-pay | Admitting: *Deleted

## 2017-04-09 ENCOUNTER — Emergency Department: Payer: Medicare Other

## 2017-04-09 DIAGNOSIS — I10 Essential (primary) hypertension: Secondary | ICD-10-CM | POA: Insufficient documentation

## 2017-04-09 DIAGNOSIS — R1030 Lower abdominal pain, unspecified: Secondary | ICD-10-CM | POA: Diagnosis present

## 2017-04-09 DIAGNOSIS — K5790 Diverticulosis of intestine, part unspecified, without perforation or abscess without bleeding: Secondary | ICD-10-CM | POA: Insufficient documentation

## 2017-04-09 DIAGNOSIS — Z8673 Personal history of transient ischemic attack (TIA), and cerebral infarction without residual deficits: Secondary | ICD-10-CM | POA: Insufficient documentation

## 2017-04-09 DIAGNOSIS — K567 Ileus, unspecified: Secondary | ICD-10-CM | POA: Diagnosis present

## 2017-04-09 DIAGNOSIS — M1990 Unspecified osteoarthritis, unspecified site: Secondary | ICD-10-CM | POA: Diagnosis not present

## 2017-04-09 DIAGNOSIS — N281 Cyst of kidney, acquired: Secondary | ICD-10-CM | POA: Diagnosis not present

## 2017-04-09 DIAGNOSIS — D751 Secondary polycythemia: Secondary | ICD-10-CM | POA: Diagnosis not present

## 2017-04-09 DIAGNOSIS — Z882 Allergy status to sulfonamides status: Secondary | ICD-10-CM | POA: Diagnosis not present

## 2017-04-09 DIAGNOSIS — Z7901 Long term (current) use of anticoagulants: Secondary | ICD-10-CM | POA: Diagnosis not present

## 2017-04-09 DIAGNOSIS — R1114 Bilious vomiting: Secondary | ICD-10-CM | POA: Diagnosis present

## 2017-04-09 DIAGNOSIS — Z7982 Long term (current) use of aspirin: Secondary | ICD-10-CM | POA: Insufficient documentation

## 2017-04-09 DIAGNOSIS — Z933 Colostomy status: Secondary | ICD-10-CM | POA: Insufficient documentation

## 2017-04-09 DIAGNOSIS — Z86711 Personal history of pulmonary embolism: Secondary | ICD-10-CM | POA: Insufficient documentation

## 2017-04-09 DIAGNOSIS — Z86718 Personal history of other venous thrombosis and embolism: Secondary | ICD-10-CM | POA: Diagnosis not present

## 2017-04-09 DIAGNOSIS — I700 Atherosclerosis of aorta: Secondary | ICD-10-CM | POA: Insufficient documentation

## 2017-04-09 DIAGNOSIS — K8020 Calculus of gallbladder without cholecystitis without obstruction: Secondary | ICD-10-CM | POA: Insufficient documentation

## 2017-04-09 DIAGNOSIS — Z993 Dependence on wheelchair: Secondary | ICD-10-CM | POA: Diagnosis not present

## 2017-04-09 LAB — URINALYSIS, COMPLETE (UACMP) WITH MICROSCOPIC
Bacteria, UA: NONE SEEN
Bilirubin Urine: NEGATIVE
GLUCOSE, UA: NEGATIVE mg/dL
HGB URINE DIPSTICK: NEGATIVE
Ketones, ur: NEGATIVE mg/dL
Leukocytes, UA: NEGATIVE
NITRITE: NEGATIVE
Protein, ur: NEGATIVE mg/dL
SPECIFIC GRAVITY, URINE: 1.024 (ref 1.005–1.030)
pH: 5 (ref 5.0–8.0)

## 2017-04-09 LAB — COMPREHENSIVE METABOLIC PANEL
ALBUMIN: 3.2 g/dL — AB (ref 3.5–5.0)
ALK PHOS: 74 U/L (ref 38–126)
ALT: 8 U/L — ABNORMAL LOW (ref 14–54)
AST: 19 U/L (ref 15–41)
Anion gap: 8 (ref 5–15)
BILIRUBIN TOTAL: 0.6 mg/dL (ref 0.3–1.2)
BUN: 13 mg/dL (ref 6–20)
CALCIUM: 8.7 mg/dL — AB (ref 8.9–10.3)
CO2: 24 mmol/L (ref 22–32)
Chloride: 107 mmol/L (ref 101–111)
Creatinine, Ser: 0.59 mg/dL (ref 0.44–1.00)
GFR calc Af Amer: >60 mL/min (ref >60)
GFR calc non Af Amer: >60 mL/min (ref >60)
GLUCOSE: 127 mg/dL — AB (ref 65–99)
Potassium: 4.1 mmol/L (ref 3.5–5.1)
Sodium: 139 mmol/L (ref 135–145)
TOTAL PROTEIN: 7.3 g/dL (ref 6.5–8.1)

## 2017-04-09 LAB — CBC
HCT: 40.3 % (ref 35.0–47.0)
Hemoglobin: 13.6 g/dL (ref 12.0–16.0)
MCH: 29.7 pg (ref 26.0–34.0)
MCHC: 33.7 g/dL (ref 32.0–36.0)
MCV: 88.1 fL (ref 80.0–100.0)
PLATELETS: 1072 10*3/uL — AB (ref 150–440)
RBC: 4.57 MIL/uL (ref 3.80–5.20)
RDW: 35.7 % — AB (ref 11.5–14.5)
WBC: 2.3 10*3/uL — ABNORMAL LOW (ref 3.6–11.0)

## 2017-04-09 LAB — LIPASE, BLOOD: Lipase: 17 U/L (ref 11–51)

## 2017-04-09 LAB — PROTIME-INR
INR: 1.61
PROTHROMBIN TIME: 19.3 s — AB (ref 11.4–15.2)

## 2017-04-09 MED ORDER — IOPAMIDOL (ISOVUE-300) INJECTION 61%
100.0000 mL | Freq: Once | INTRAVENOUS | Status: AC | PRN
  Administered 2017-04-09: 100 mL via INTRAVENOUS

## 2017-04-09 MED ORDER — SODIUM CHLORIDE 0.9 % IV BOLUS (SEPSIS)
1000.0000 mL | Freq: Once | INTRAVENOUS | Status: AC
  Administered 2017-04-09: 1000 mL via INTRAVENOUS

## 2017-04-09 MED ORDER — MAGNESIUM CITRATE PO SOLN
0.5000 | Freq: Once | ORAL | Status: AC
  Administered 2017-04-10: 0.5 via ORAL
  Filled 2017-04-09: qty 296

## 2017-04-09 NOTE — ED Provider Notes (Signed)
[facility] Emergency Department Provider Note  ____________________________________________  Time seen: Approximately 9:53 PM  I have reviewed the triage vital signs and the nursing notes.   HISTORY  Chief Complaint Nausea and Emesis  Level 5 caveat:  Portions of the history and physical were unable to be obtained due to the patient's poor historian HPI Madgeline Rayo is a 83 y.o. female who complains of fatigue and generalized weakness today. Also generalized abdominal pain with nausea. Had an episode of large-volume emesis in the ED. Also reports that she has not had any colostomy bag output since yesterday. She was seen at the [facility] emergency Department yesterday had a CT scan which was unremarkable. Patient was given Phenergan for the nausea and discharged home, but however has been unable to tolerate oral intake and again hasn't had any colostomy output since then.Abdominal pain is nonradiating, waxing and waning but constant. Moderate intensity. No aggravating or alleviating factors.     Past Medical History:  Diagnosis Date  . Arthritis   . Collagen vascular disease ([place])   . Deep venous thrombosis (HCC)    right lower extremity  . Dependent edema   . History of bilateral hip replacements   . History of hysterectomy   . Hypertension   . Polycythemia vera ([place])   . Stroke [facility])    Pt reports she was told she had a stroke in may     Patient Active Problem List   Diagnosis Date Noted  . Influenza with respiratory manifestation 12/01/2016  . Respiratory distress 11/23/2016  . Urinary incontinence in female 09/02/2016  . Vision loss of right eye 08/26/2016  . Blindness of right eye 08/26/2016  . Inability to ambulate due to hip 10/12/2015  . Colostomy in place [facility]) 10/04/2015  . Chronic anticoagulation 10/03/2015  . Acute pulmonary embolism ([place]) 10/03/2015  . Leukocytosis 09/25/2015  . Chest pain 09/25/2015  . COPD exacerbation ([place])  09/24/2015  . Right lower lobe pneumonia ([place]) 09/24/2015  . Chronic obstructive pulmonary disease with acute exacerbation ([place]) 09/24/2015  . Dyspnea 09/22/2015  . Elevated troponin 09/15/2015  . UTI (lower urinary tract infection) 07/16/2015  . Abdominal wall cellulitis 07/15/2015  . DVT (deep venous thrombosis) ([place]) 07/15/2015  . HTN (hypertension) 07/15/2015  . Polycythemia vera ([place]) 07/15/2015  . Dependent edema 07/15/2015  . Arthritis 07/15/2015  . Pulmonary emboli ([place]) 05/25/2015  . Diverticulosis of colon with hemorrhage 04/02/2015  . Diverticulosis of intestine with bleeding 04/02/2015  . Dehydration, moderate 01/11/2015  . Fall 01/10/2015  . Hypertension 01/10/2015  . Osteoarthritis 01/10/2015  . Hammertoe 09/02/2012  . S/P transmetatarsal amputation of foot ([place]) 09/02/2012  . Onychomycosis 09/02/2012  . Other specified dermatoses 09/02/2012  . Pain in toe 09/02/2012  . Hip pain 08/23/2012  . S/P hip replacement 08/23/2012  . Glenohumeral arthritis 06/28/2012     Past Surgical History:  Procedure Laterality Date  . ABDOMINAL HYSTERECTOMY    . CARDIAC CATHETERIZATION Right 04/03/2015   Procedure: CENTRAL LINE INSERTION;  Surgeon: Sherri Rad, MD;  Location: [facility] ORS;  Service: General;  Laterality: Right;  . COLECTOMY WITH COLOSTOMY CREATION/HARTMANN PROCEDURE N/A 04/03/2015   Procedure: COLECTOMY WITH COLOSTOMY CREATION/HARTMANN PROCEDURE;  Surgeon: Sherri Rad, MD;  Location: [facility] ORS;  Service: General;  Laterality: N/A;  . JOINT REPLACEMENT     Left and Right Hip  . PERIPHERAL VASCULAR CATHETERIZATION N/A 04/02/2015   Procedure: Visceral Angiography;  Surgeon: Algernon Huxley, MD;  Location: [place] CV LAB;  Service: Cardiovascular;  Laterality: N/A;  . PERIPHERAL VASCULAR CATHETERIZATION N/A 04/02/2015   Procedure: Visceral Artery Intervention;  Surgeon: Algernon Huxley, MD;  Location: [place] CV LAB;  Service: Cardiovascular;  Laterality: N/A;  . PERIPHERAL  VASCULAR CATHETERIZATION N/A 05/25/2015   Procedure: IVC Filter Insertion;  Surgeon: Katha Cabal, MD;  Location: [place] CV LAB;  Service: Cardiovascular;  Laterality: N/A;  . TOE AMPUTATION     right     Prior to Admission medications   Medication Sig Start Date End Date Taking? Authorizing Provider  aspirin EC 81 MG tablet Take 1 tablet (81 mg total) by mouth daily. 09/16/15   [name], MD  docusate sodium (COLACE) 100 MG capsule Take 100 mg by mouth 2 (two) times daily as needed for mild constipation.     [provider]  folic acid (FOLVITE) 161 MCG tablet Take 400 mcg by mouth daily.    [provider]  hydroxyurea (HYDREA) 500 MG capsule Take  4 tablets daily 02/12/17   Lequita Asal, MD  ipratropium-albuterol (DUONEB) 0.5-2.5 (3) MG/3ML SOLN Take 3 mLs by nebulization every 6 (six) hours as needed. Patient not taking: Reported on 03/06/2017 11/29/16   Epifanio Lesches, MD  menthol-cetylpyridinium (CEPACOL) 3 MG lozenge Take 1 lozenge (3 mg total) by mouth as needed for sore throat. 09/25/15   Theodoro Grist, MD  metoprolol tartrate (LOPRESSOR) 25 MG tablet Take 12.5 mg by mouth 2 (two) times daily.    [provider]  Polyethylene Glycol POWD Take 17 g by mouth daily. 11/22/15   Clayburn Pert, MD  potassium chloride SA (K-DUR,KLOR-CON) 20 MEQ tablet Take 1 tablet (20 mEq total) by mouth 2 (two) times daily. 07/20/15   Vaughan Basta, MD  warfarin (COUMADIN) 4 MG tablet Take 1 tablet (4 mg total) by mouth daily. 03/13/17   Lequita Asal, MD  warfarin (COUMADIN) 5 MG tablet Take 1 tablet (5 mg total) by mouth daily. 03/13/17   Lequita Asal, MD     Allergies Sulfamethoxazole-trimethoprim   Family History  Problem Relation Age of Onset  . Heart attack Father   . COPD Father   . Hypertension Unknown   . Arthritis-Osteo Unknown   . Diabetes Brother   . Osteosarcoma Son     Social History Social History   Substance Use Topics  . Smoking status: Never Smoker  . Smokeless tobacco: Never Used  . Alcohol use No    Review of Systems  Constitutional:   No fever or chills.  ENT:   No sore throat. No rhinorrhea. Cardiovascular:   No chest pain or syncope. Respiratory:   No dyspnea or cough. Gastrointestinal:   Positive as above for abdominal pain, vomiting, constipation..  Musculoskeletal:   Negative for focal pain or swelling All other systems reviewed and are negative except as documented above in ROS and HPI.  ____________________________________________   PHYSICAL EXAM:  VITAL SIGNS: ED Triage Vitals  Enc Vitals Group     BP 04/09/17 2117 (!) 173/92     Pulse Rate 04/09/17 2117 80     Resp 04/09/17 2117 20     Temp 04/09/17 2117 98.5 F (36.9 C)     Temp Source 04/09/17 2117 Oral     SpO2 04/09/17 2117 94 %     Weight 04/09/17 2109 225 lb (102.1 kg)     Height 04/09/17 2109 5\' 6"  (1.676 m)     Head Circumference --  Peak Flow --      Pain Score 04/09/17 2108 10     Pain Loc --      Pain Edu? --      Excl. in [place]? --     Vital signs reviewed, nursing assessments reviewed.   Constitutional:   Alert and oriented. Well appearing and in no distress. Eyes:   No scleral icterus. No conjunctival pallor. PERRL. EOMI.  No nystagmus. ENT   Head:   Normocephalic and atraumatic.   Nose:   No congestion/rhinnorhea.    Mouth/Throat:   MMM, no pharyngeal erythema. No peritonsillar mass.    Neck:   No meningismus. Full ROM Hematological/Lymphatic/Immunilogical:   No cervical lymphadenopathy. Cardiovascular:   RRR. Symmetric bilateral radial and DP pulses.  No murmurs.  Respiratory:   Normal respiratory effort without tachypnea/retractions. Breath sounds are clear and equal bilaterally. No wheezes/rales/rhonchi. Gastrointestinal:   Soft and nontender. Mildly distended. Colostomy site in left lower quadrant. There is no CVA tenderness.  No rebound, rigidity, or  guarding. Genitourinary:   deferred Musculoskeletal:   Normal range of motion in all extremities. No joint effusions.  No lower extremity tenderness.  No edema. Neurologic:   Normal speech and language.  Motor grossly intact. No gross focal neurologic deficits are appreciated.  Skin:    Skin is warm, dry and intact. No rash noted.  No petechiae, purpura, or bullae.  ____________________________________________    LABS (pertinent positives/negatives) (all labs ordered are listed, but only abnormal results are displayed) Labs Reviewed  LIPASE, BLOOD  COMPREHENSIVE METABOLIC PANEL  CBC  URINALYSIS, COMPLETE (UACMP) WITH MICROSCOPIC  PROTIME-INR   ____________________________________________   EKG  Interpreted by me Sinus rhythm rate of 78, left axis, normal intervals. Right bundle branch block. No acute ischemic changes.  ____________________________________________    RADIOLOGY  No results found.  ____________________________________________   PROCEDURES Procedures  ____________________________________________   INITIAL IMPRESSION / ASSESSMENT AND PLAN / ED COURSE  Pertinent labs & imaging results that were available during my care of the patient were reviewed by me and considered in my medical decision making (see chart for details).  Patient presents with generalized abdominal pain vomiting and no colostomy output in 24 hours. Despite having a CT scan yesterday, it is necessary to repeat the CT scan today for evolving SBO. Abdomen does not appear surgical at this time. Vital signs are unremarkable, patient is not in distress. We'll give IV fluids for hydration in the meantime.  Clinical Course as of Apr 10 14  Thu Apr 09, 2017  2256 CT went to take pt for scan, but she now refuses until she talks to her daughter about it. Daughter had previously been informed of plan and agreed with repeat CT today. Will attempt to put them in touch.  [PS]  Fri Apr 10, 2017   0001 Heme/onc paged Platelets: (!!) 1,072 [PS]  0001 CT  unremarkable  [PS]  0014 Case signed out to  Dr. Beather Arbour to f/u heme/onc recs  [PS]    Clinical Course User Index [PS] Carrie Mew, MD     ____________________________________________   FINAL CLINICAL IMPRESSION(S) / ED DIAGNOSES  Final diagnoses:  Bilious vomiting with nausea  Colostomy present [facility])      New Prescriptions   No medications on file     Portions of this note were generated with dragon dictation software. Dictation errors may occur despite best attempts at proofreading.    Carrie Mew, MD 04/10/17 [phone]

## 2017-04-09 NOTE — ED Triage Notes (Addendum)
Per EMS report,  Patient c/o abdominal pain and nausea. Patient's granddaughter reported to EMS that the patient had some confusion today also. Patient was seen at [facility] yesterday for same complaint and had a CT scan which was negative, colostomy bag was changed at that time. Patient's granddaughter told EMS that the patient had poor output through the colostomy bag today. Patient had a large emesis upon arrival.

## 2017-04-10 ENCOUNTER — Other Ambulatory Visit: Payer: Medicare Other

## 2017-04-10 ENCOUNTER — Inpatient Hospital Stay: Payer: Medicare Other | Attending: Hematology and Oncology

## 2017-04-10 ENCOUNTER — Encounter: Payer: Self-pay | Admitting: Emergency Medicine

## 2017-04-10 DIAGNOSIS — K567 Ileus, unspecified: Secondary | ICD-10-CM | POA: Diagnosis not present

## 2017-04-10 LAB — TSH: TSH: 2.553 u[IU]/mL (ref 0.350–4.500)

## 2017-04-10 MED ORDER — POLYETHYLENE GLYCOL 3350 17 G PO PACK
17.0000 g | PACK | Freq: Every day | ORAL | Status: DC
  Administered 2017-04-11: 17 g via ORAL
  Filled 2017-04-10: qty 1

## 2017-04-10 MED ORDER — METOPROLOL TARTRATE 25 MG PO TABS
12.5000 mg | ORAL_TABLET | Freq: Every day | ORAL | Status: DC
  Administered 2017-04-10 – 2017-04-11 (×2): 12.5 mg via ORAL
  Filled 2017-04-10 (×2): qty 1

## 2017-04-10 MED ORDER — WARFARIN SODIUM 5 MG PO TABS
7.5000 mg | ORAL_TABLET | Freq: Once | ORAL | Status: AC
  Administered 2017-04-10: 7.5 mg via ORAL
  Filled 2017-04-10: qty 2

## 2017-04-10 MED ORDER — FOLIC ACID 1 MG PO TABS
0.5000 mg | ORAL_TABLET | Freq: Every day | ORAL | Status: DC
  Administered 2017-04-10 – 2017-04-11 (×2): 0.5 mg via ORAL
  Filled 2017-04-10 (×2): qty 1

## 2017-04-10 MED ORDER — POTASSIUM CHLORIDE CRYS ER 20 MEQ PO TBCR
20.0000 meq | EXTENDED_RELEASE_TABLET | Freq: Two times a day (BID) | ORAL | Status: DC
  Administered 2017-04-10 – 2017-04-11 (×4): 20 meq via ORAL
  Filled 2017-04-10 (×4): qty 1

## 2017-04-10 MED ORDER — ONDANSETRON HCL 4 MG PO TABS: 4.0000 mg | ORAL_TABLET | Freq: Four times a day (QID) | ORAL | Status: DC | PRN

## 2017-04-10 MED ORDER — ONDANSETRON HCL 4 MG/2ML IJ SOLN
4.0000 mg | Freq: Once | INTRAMUSCULAR | Status: AC
  Administered 2017-04-10: 4 mg via INTRAVENOUS
  Filled 2017-04-10: qty 2

## 2017-04-10 MED ORDER — POLYETHYLENE GLYCOL POWD: 17.0000 g | Freq: Every day | Status: DC

## 2017-04-10 MED ORDER — WARFARIN SODIUM 5 MG PO TABS: 5.0000 mg | ORAL_TABLET | ORAL | Status: DC

## 2017-04-10 MED ORDER — MORPHINE SULFATE (PF) 2 MG/ML IV SOLN
2.0000 mg | Freq: Once | INTRAVENOUS | Status: AC
  Administered 2017-04-10: 2 mg via INTRAVENOUS
  Filled 2017-04-10: qty 1

## 2017-04-10 MED ORDER — WARFARIN SODIUM 3 MG PO TABS: 4.0000 mg | ORAL_TABLET | ORAL | Status: DC

## 2017-04-10 MED ORDER — WARFARIN - PHARMACIST DOSING INPATIENT
Freq: Every day | Status: DC
  Administered 2017-04-10: 18:00:00

## 2017-04-10 MED ORDER — ONDANSETRON HCL 4 MG/2ML IJ SOLN: 4.0000 mg | Freq: Four times a day (QID) | INTRAMUSCULAR | Status: DC | PRN

## 2017-04-10 MED ORDER — HYDROXYUREA 500 MG PO CAPS
2000.0000 mg | ORAL_CAPSULE | Freq: Every day | ORAL | Status: DC
  Administered 2017-04-10 – 2017-04-11 (×2): 2000 mg via ORAL
  Filled 2017-04-10 (×3): qty 4

## 2017-04-10 MED ORDER — ACETAMINOPHEN 650 MG RE SUPP: 650.0000 mg | Freq: Four times a day (QID) | RECTAL | Status: DC | PRN

## 2017-04-10 MED ORDER — ASPIRIN EC 81 MG PO TBEC
81.0000 mg | DELAYED_RELEASE_TABLET | Freq: Every day | ORAL | Status: DC
  Administered 2017-04-10 – 2017-04-11 (×2): 81 mg via ORAL
  Filled 2017-04-10 (×2): qty 1

## 2017-04-10 MED ORDER — ACETAMINOPHEN 325 MG PO TABS: 650.0000 mg | ORAL_TABLET | Freq: Four times a day (QID) | ORAL | Status: DC | PRN

## 2017-04-10 MED ORDER — SODIUM CHLORIDE 0.9 % IV SOLN
INTRAVENOUS | Status: DC
  Administered 2017-04-10 – 2017-04-11 (×3): via INTRAVENOUS

## 2017-04-10 MED ORDER — WARFARIN SODIUM 1 MG PO TABS: 2.0000 mg | ORAL_TABLET | ORAL | Status: DC

## 2017-04-10 MED ORDER — FOLIC ACID 400 MCG PO TABS: 400.0000 ug | ORAL_TABLET | Freq: Every day | ORAL | Status: DC

## 2017-04-10 MED ORDER — DOCUSATE SODIUM 100 MG PO CAPS
100.0000 mg | ORAL_CAPSULE | Freq: Two times a day (BID) | ORAL | Status: DC
  Administered 2017-04-10 – 2017-04-11 (×4): 100 mg via ORAL
  Filled 2017-04-10 (×4): qty 1

## 2017-04-10 NOTE — Care Management (Signed)
Patient open with [facility] health for PT. Yolanda Wells with PT notified of Admission.  Patient lives at home with granddaughter.  Patient has hospital bed, WC, and RW in the home.  Granddaughter states "we have everything in the home we need to take care of her"  Medicare Obs letter reviewed, signed and copy sent to HIM.

## 2017-04-10 NOTE — Progress Notes (Signed)
ANTICOAGULATION CONSULT NOTE - Initial Consult  Pharmacy Consult for warfarin Indication: history of DVT, polycythemia  Allergies  Allergen Reactions  . Sulfamethoxazole-Trimethoprim Nausea And Vomiting   Patient Measurements: Height: 5\' 6"  (167.6 cm) (pt is unsure) Weight: 215 lb 9.8 oz (97.8 kg) IBW/kg (Calculated) : 59.3  Vital Signs: Temp: 97.7 F (36.5 C) (06/01 1018) Temp Source: Oral (06/01 1018) BP: 109/63 (06/01 1251) Pulse Rate: 73 (06/01 1251)  Labs:  Recent Labs  04/09/17 2140  HGB 13.6  HCT 40.3  PLT 1,072*  LABPROT 19.3*  INR 1.61  CREATININE 0.59   Estimated Creatinine Clearance: 62.8 mL/min (by C-G formula based on SCr of 0.59 mg/dL).  Medical History: Past Medical History:  Diagnosis Date  . Arthritis   . Collagen vascular disease ([place])   . Deep venous thrombosis (HCC)    right lower extremity  . Dependent edema   . History of bilateral hip replacements   . History of hysterectomy   . Hypertension   . Polycythemia vera ([place])   . Stroke [facility])    Pt reports she was told she had a stroke in may   Assessment: Pharmacy consulted to monitor and dose warfarin in this 83 year old female who was taking warfarin prior to admission for a history of polycythemia and DVT.   Home regimen: Warfarin 5 mg PO MWF         Warfarin 4 mg PO TTSS  INR on admission = 1.6  Dosing history: Date INR Dose 5/31 1.6 No dose given overnight in hospital 6/1 --    Goal of Therapy:  INR 2-3 Monitor platelets by anticoagulation protocol: Yes   Plan:  INR is subtherapeutic. No bridge therapy at this time per MD. Will order warfarin 7.5 mg PO this evening - will give a higher dose since INR is subtherapeutic.   INR ordered with AM labs.  Lenis Noon, PharmD Clinical Pharmacist 04/10/2017,2:00 PM

## 2017-04-10 NOTE — ED Notes (Signed)
MD Marcille Blanco at bedside at this time.

## 2017-04-10 NOTE — Progress Notes (Signed)
Admitted this morning for nausea, abdominal cramps. Patient thought to have ileus. Started on nausea medicine, IV hydration, now she feels better, less abdominal pain, nausea, weakness. And tolerating clear liquids. Labs are within normal range. Discharge from [facility] on Wednesday and comes back Thursday night to [place] with the same problem. Labs, medications reviewed.  Vitals stable  Physical examination alert, awake, oriented, eating ice cream now. Cardiovascular system S1, S2 regular. Lungs: Clear to auscultation, no wheezing, no rales. Gastroenterology: Abdomen is soft, nontender, nondistended, bowel sounds present, colostomy bag present in the left lower quadrant with yellow stool.  Assessment and plan: Ileus: Improving, patient  Wants  regular food, advance to solid diet, continue to watch her closely, likely stress tomorrow, continue IV hydration, IV nausea medicines. #2. polycythemia with thrombocytosis: Continue hydroxyurea, patient already on warfarin: Continue that. #3. nausea: Continue IV nausea medicines. History of diverticular disease status post colostomy. Patient is wheelchair bound and she is from home. Time spent 20 minutes.

## 2017-04-10 NOTE — ED Provider Notes (Signed)
-----------------------------------------   1:09 AM on 04/10/2017 -----------------------------------------  No callback from heme/onc who was paged several times. Patient complains of persistent lower abdominal pain and nausea. She and her family member (who is a Marine scientist at [employer]) are uncomfortable being discharged home as she still has no output through her colostomy bag. Likely patient has an ileus causing her symptoms. Will administer analgesia and discuss with hospitalist to evaluate patient in the emergency department for admission.   Paulette Blanch, MD 04/10/17 0500

## 2017-04-10 NOTE — ED Notes (Signed)
Report to Ander Purpura, RN 2C

## 2017-04-10 NOTE — Care Management Obs Status (Signed)
[place] NOTIFICATION   Patient Details  Name: Irmalee Riemenschneider MRN: [id number] Date of Birth: 03/25/1932   Medicare Observation Status Notification Given:  Yes    Beverly Sessions, RN 04/10/2017, 4:30 PM

## 2017-04-10 NOTE — H&P (Signed)
Kady Toothaker is an 83 y.o. female.   Chief Complaint: Nausea HPI: The patient with past medical history of diverticulitis with hemorrhage status post colostomy, hypertension, stroke and polycythemia presents to the emergency department with nausea and poor colostomy output. The patient states that she began feeling weak this evening after being discharged from [facility] yesterday with similar symptoms. At that time the patient had colostomy output. It was nonbloody. Tonight her colostomy is empty and she has feels nauseous. She denies vomiting today but admits to nonbloody nonbilious emesis yesterday. Imaging of the abdomen showed ileus which prompted emergency department staff to call the hospitalist service for admission.  Past Medical History:  Diagnosis Date  . Arthritis   . Collagen vascular disease ([place])   . Deep venous thrombosis (HCC)    right lower extremity  . Dependent edema   . History of bilateral hip replacements   . History of hysterectomy   . Hypertension   . Polycythemia vera ([place])   . Stroke [facility])    Pt reports she was told she had a stroke in may    Past Surgical History:  Procedure Laterality Date  . ABDOMINAL HYSTERECTOMY    . CARDIAC CATHETERIZATION Right 04/03/2015   Procedure: CENTRAL LINE INSERTION;  Surgeon: Sherri Rad, MD;  Location: [facility] ORS;  Service: General;  Laterality: Right;  . COLECTOMY WITH COLOSTOMY CREATION/HARTMANN PROCEDURE N/A 04/03/2015   Procedure: COLECTOMY WITH COLOSTOMY CREATION/HARTMANN PROCEDURE;  Surgeon: Sherri Rad, MD;  Location: [facility] ORS;  Service: General;  Laterality: N/A;  . JOINT REPLACEMENT     Left and Right Hip  . PERIPHERAL VASCULAR CATHETERIZATION N/A 04/02/2015   Procedure: Visceral Angiography;  Surgeon: Algernon Huxley, MD;  Location: [place] CV LAB;  Service: Cardiovascular;  Laterality: N/A;  . PERIPHERAL VASCULAR CATHETERIZATION N/A 04/02/2015   Procedure: Visceral Artery Intervention;  Surgeon: Algernon Huxley, MD;   Location: [place] [facility];  Service: Cardiovascular;  Laterality: N/A;  . PERIPHERAL VASCULAR CATHETERIZATION N/A 05/25/2015   Procedure: IVC Filter Insertion;  Surgeon: Katha Cabal, MD;  Location: [place] [facility] LAB;  Service: Cardiovascular;  Laterality: N/A;  . TOE AMPUTATION     right    Family History  Problem Relation Age of Onset  . Heart attack Father   . COPD Father   . Hypertension Unknown   . Arthritis-Osteo Unknown   . Diabetes Brother   . Osteosarcoma Son    Social History:  reports that she has never smoked. She has never used smokeless tobacco. She reports that she does not drink alcohol or use drugs.  Allergies:  Allergies  Allergen Reactions  . Sulfamethoxazole-Trimethoprim Nausea And Vomiting    Medications Prior to Admission  Medication Sig Dispense Refill  . aspirin EC 81 MG tablet Take 1 tablet (81 mg total) by mouth daily. 30 tablet 2  . docusate sodium (COLACE) 100 MG capsule Take 100 mg by mouth 2 (two) times daily.     . folic acid (FOLVITE) 093 MCG tablet Take 400 mcg by mouth daily.    . hydroxyurea (HYDREA) 500 MG capsule Take  4 tablets daily (Patient taking differently: Take 2,000 mg by mouth daily. ) 120 capsule 1  . metoprolol tartrate (LOPRESSOR) 25 MG tablet Take 12.5 mg by mouth daily.     . Polyethylene Glycol POWD Take 17 g by mouth daily. (Patient taking differently: Take 17 g by mouth every other day. ) 527 g 5  . potassium chloride SA (  K-DUR,KLOR-CON) 20 MEQ tablet Take 1 tablet (20 mEq total) by mouth 2 (two) times daily. 30 tablet 0  . warfarin (COUMADIN) 4 MG tablet Take 4 mg by mouth as directed. Tuesday, Thursday, Saturday and Sunday     . warfarin (COUMADIN) 5 MG tablet Take 1 tablet (5 mg total) by mouth daily. (Patient taking differently: Take 5 mg by mouth 3 (three) times a week. Monday, Wednesday, Friday) 30 tablet 3    Results for orders placed or performed during the hospital encounter of 04/09/17 (from the past 48  hour(s))  Urinalysis, Complete w Microscopic     Status: Abnormal   Collection Time: 04/09/17  8:06 PM  Result Value Ref Range   Color, Urine YELLOW (A) YELLOW   APPearance CLEAR (A) CLEAR   Specific Gravity, Urine 1.024 1.005 - 1.030   pH 5.0 5.0 - 8.0   Glucose, UA NEGATIVE NEGATIVE mg/dL   Hgb urine dipstick NEGATIVE NEGATIVE   Bilirubin Urine NEGATIVE NEGATIVE   Ketones, ur NEGATIVE NEGATIVE mg/dL   Protein, ur NEGATIVE NEGATIVE mg/dL   Nitrite NEGATIVE NEGATIVE   Leukocytes, UA NEGATIVE NEGATIVE   RBC / HPF 0-5 0 - 5 RBC/hpf   WBC, UA 0-5 0 - 5 WBC/hpf   Bacteria, UA NONE SEEN NONE SEEN   Squamous Epithelial / LPF 0-5 (A) NONE SEEN   Mucous PRESENT   Lipase, blood     Status: None   Collection Time: 04/09/17  9:40 PM  Result Value Ref Range   Lipase 17 11 - 51 U/L  Comprehensive metabolic panel     Status: Abnormal   Collection Time: 04/09/17  9:40 PM  Result Value Ref Range   Sodium 139 135 - 145 mmol/L   Potassium 4.1 3.5 - 5.1 mmol/L   Chloride 107 101 - 111 mmol/L   CO2 24 22 - 32 mmol/L   Glucose, Bld 127 (H) 65 - 99 mg/dL   BUN 13 6 - 20 mg/dL   Creatinine, Ser 0.59 0.44 - 1.00 mg/dL   Calcium 8.7 (L) 8.9 - 10.3 mg/dL   Total Protein 7.3 6.5 - 8.1 g/dL   Albumin 3.2 (L) 3.5 - 5.0 g/dL   AST 19 15 - 41 U/L   ALT 8 (L) 14 - 54 U/L   Alkaline Phosphatase 74 38 - 126 U/L   Total Bilirubin 0.6 0.3 - 1.2 mg/dL   GFR calc non Af Amer >60 >60 mL/min   GFR calc Af Amer >60 >60 mL/min    Comment: (NOTE) The eGFR has been calculated using the CKD EPI equation. This calculation has not been validated in all clinical situations. eGFR's persistently <60 mL/min signify possible Chronic Kidney Disease.    Anion gap 8 5 - 15  CBC     Status: Abnormal   Collection Time: 04/09/17  9:40 PM  Result Value Ref Range   WBC 2.3 (L) 3.6 - 11.0 K/uL   RBC 4.57 3.80 - 5.20 MIL/uL   Hemoglobin 13.6 12.0 - 16.0 g/dL   HCT 40.3 35.0 - 47.0 %   MCV 88.1 80.0 - 100.0 fL   MCH 29.7  26.0 - 34.0 pg   MCHC 33.7 32.0 - 36.0 g/dL   RDW 35.7 (H) 11.5 - 14.5 %   Platelets 1,072 (HH) 150 - 440 K/uL    Comment: PLATELET CLUMPS NOTED ON SMEAR, UNABLE TO ESTIMATE CRITICAL RESULT CALLED TO, READ BACK BY AND VERIFIED WITH: ALLISON PATE @ 2350 ON 04/09/2017 BY CAF  Protime-INR     Status: Abnormal   Collection Time: 04/09/17  9:40 PM  Result Value Ref Range   Prothrombin Time 19.3 (H) 11.4 - 15.2 seconds   INR 1.61   TSH     Status: None   Collection Time: 04/09/17  9:40 PM  Result Value Ref Range   TSH 2.553 0.350 - 4.500 uIU/mL    Comment: Performed by a 3rd Generation assay with a functional sensitivity of <=0.01 uIU/mL.   Ct Abdomen Pelvis W Contrast  Result Date: 04/09/2017 CLINICAL DATA:  Acute onset of generalized abdominal pain, nausea and vomiting. Confusion. Initial encounter. EXAM: CT ABDOMEN AND PELVIS WITH CONTRAST TECHNIQUE: Multidetector CT imaging of the abdomen and pelvis was performed using the standard protocol following bolus administration of intravenous contrast. CONTRAST:  165m ISOVUE-300 IOPAMIDOL (ISOVUE-300) INJECTION 61% COMPARISON:  CT of the abdomen and pelvis from 11/27/2015 FINDINGS: Lower chest: Minimal bibasilar atelectasis or scarring is noted. Dense calcification is noted at the mitral valve. A tiny hiatal hernia is noted. Hepatobiliary: The liver is grossly unremarkable in appearance. Stones are seen dependently within the gallbladder. The gallbladder is otherwise unremarkable. The common bile duct remains normal in caliber. Pancreas: The pancreas is within normal limits. Spleen: The spleen is unremarkable in appearance. Adrenals/Urinary Tract: The adrenal glands are grossly unremarkable in appearance. Large right renal cysts are noted, measuring up to 8.5 cm in size. Right renal scarring is noted. There is no evidence of hydronephrosis. No renal or ureteral stones are identified. No perinephric stranding is appreciated. Stomach/Bowel: The stomach is  unremarkable in appearance. The small bowel is within normal limits. The appendix is normal in caliber, without evidence of appendicitis. The patient's left lower quadrant colostomy at the mid transverse colon is grossly unremarkable in appearance. The distal descending and sigmoid colon are completely decompressed, with mild underlying diverticulosis. Vascular/Lymphatic: Scattered calcification is seen along the abdominal aorta and its branches. The abdominal aorta is otherwise grossly unremarkable. The inferior vena cava is grossly unremarkable. No retroperitoneal lymphadenopathy is seen. No pelvic sidewall lymphadenopathy is identified. Reproductive: The bladder is not well assessed due to metal artifact. The patient is status post hysterectomy. No suspicious adnexal masses are seen. Other: No additional soft tissue abnormalities are seen. Musculoskeletal: No acute osseous abnormalities are identified. There is mild grade 1 anterolisthesis of L4 on L5, reflecting underlying facet disease. Multilevel vacuum phenomenon is noted along the lumbar spine. Bilateral hip arthroplasties are grossly unremarkable in appearance. The visualized musculature is unremarkable in appearance. IMPRESSION: 1. Left lower quadrant colostomy is grossly unremarkable in appearance. 2. Distal descending and sigmoid colon are decompressed, with mild underlying diverticulosis. 3. Scattered aortic atherosclerosis. 4. Dense calcification at the mitral valve. 5. Tiny hiatal hernia. 6. Cholelithiasis.  Gallbladder otherwise grossly unremarkable. 7. Large right renal cysts.  Right renal scarring. Electronically Signed   By: JGarald BaldingM.D.   On: 04/09/2017 23:44    Review of Systems  Constitutional: Negative for chills and fever.  HENT: Negative for sore throat and tinnitus.   Eyes: Negative for blurred vision and redness.  Respiratory: Negative for cough and shortness of breath.   Cardiovascular: Negative for chest pain,  palpitations, orthopnea and PND.  Gastrointestinal: Positive for nausea. Negative for abdominal pain, diarrhea and vomiting.  Genitourinary: Negative for dysuria, frequency and urgency.  Musculoskeletal: Negative for joint pain and myalgias.  Skin: Negative for rash.       No lesions  Neurological: Positive for weakness. Negative for speech  change and focal weakness.  Endo/Heme/Allergies: Does not bruise/bleed easily.       No temperature intolerance  Psychiatric/Behavioral: Negative for depression and suicidal ideas.    Blood pressure 130/73, pulse 66, temperature 97.9 F (36.6 C), temperature source Oral, resp. rate 19, height _0  (1.676 m), weight 102.1 kg (225 lb), SpO2 97 %. Physical Exam  Nursing note and vitals reviewed. Constitutional: She is oriented to person, place, and time. She appears well-developed and well-nourished.  HENT:  Head: Normocephalic and atraumatic.  Mouth/Throat: Oropharynx is clear and moist.  Eyes: Conjunctivae and EOM are normal. Pupils are equal, round, and reactive to light.  Neck: Normal range of motion. Neck supple. No tracheal deviation present. No thyromegaly present.  Cardiovascular: Normal rate, regular rhythm and normal heart sounds.  Exam reveals no gallop and no friction rub.   No murmur heard. Respiratory: Effort normal and breath sounds normal.  GI: Soft. Bowel sounds are normal. She exhibits no distension and no mass. There is tenderness. There is no rebound and no guarding.  Colostomy in place  Genitourinary:  Genitourinary Comments: Deferred  Musculoskeletal: Normal range of motion. She exhibits no edema.  Lymphadenopathy:    She has no cervical adenopathy.  Neurological: She is alert and oriented to person, place, and time. No cranial nerve deficit. She exhibits normal muscle tone.  Skin: Skin is warm and dry. No rash noted. No erythema.  Psychiatric: She has a normal mood and affect. Her behavior is normal. Judgment and thought  content normal.     Assessment/Plan This is an 83 year old female admitted for colonic ileus. 1. Ileus: Bowel rest and intravenous fluid. Pain medication as needed. The patient is received magnesium citrate in the emergency department to stimulate her bowel. Continue stool softeners. The patient is unsteady on her feet but can walk. Encourage physical therapy and exercise to stimulate motility. 2. Nausea: Antiemetics as needed 3. Essential hypertension: Controlled; continue metoprolol 4. Polycythemia: With thrombocytosis; continue hydroxyurea  5. Vascular risk: History of stroke; continue warfarin and aspirin 6. DVT prophylaxis: As above 8. GI prophylaxis: None The patient is a full code. Time spent on admission orders and patient care proximally 45 minutes  Harrie Foreman, MD 04/10/2017, 5:38 AM

## 2017-04-11 DIAGNOSIS — K567 Ileus, unspecified: Secondary | ICD-10-CM | POA: Diagnosis not present

## 2017-04-11 LAB — PROTIME-INR
INR: 1.63
Prothrombin Time: 19.5 seconds — ABNORMAL HIGH (ref 11.4–15.2)

## 2017-04-11 LAB — HEMOGLOBIN A1C
Hgb A1c MFr Bld: 5.3 % (ref 4.8–5.6)
Mean Plasma Glucose: 105 mg/dL

## 2017-04-11 MED ORDER — WARFARIN SODIUM 5 MG PO TABS: 7.5000 mg | ORAL_TABLET | Freq: Once | ORAL | Status: DC

## 2017-04-11 NOTE — Progress Notes (Signed)
ANTICOAGULATION CONSULT NOTE - Follow Up Consult  Pharmacy Consult for warfarin Indication: history of DVT, polycythemia  Allergies  Allergen Reactions  . Sulfamethoxazole-Trimethoprim Nausea And Vomiting   Patient Measurements: Height: 5\' 6"  (167.6 cm) (pt is unsure) Weight: 214 lb 12.8 oz (97.4 kg) IBW/kg (Calculated) : 59.3  Vital Signs: Temp: 98.6 F (37 C) (06/02 0510) Temp Source: Oral (06/02 0510) BP: 162/83 (06/02 0510) Pulse Rate: 87 (06/02 0510)  Labs:  Recent Labs  04/09/17 2140 04/11/17 0416  HGB 13.6  --   HCT 40.3  --   PLT 1,072*  --   LABPROT 19.3* 19.5*  INR 1.61 1.63  CREATININE 0.59  --    Estimated Creatinine Clearance: 62.7 mL/min (by C-G formula based on SCr of 0.59 mg/dL).  Medical History: Past Medical History:  Diagnosis Date  . Arthritis   . Collagen vascular disease ([place])   . Deep venous thrombosis (HCC)    right lower extremity  . Dependent edema   . History of bilateral hip replacements   . History of hysterectomy   . Hypertension   . Polycythemia vera ([place])   . Stroke [facility])    Pt reports she was told she had a stroke in may   Assessment: Pharmacy consulted to monitor and dose warfarin in this 83 year old female who was taking warfarin prior to admission for a history of polycythemia and DVT.   Home regimen: Warfarin 5 mg PO MWF         Warfarin 4 mg PO TTSS  INR on admission = 1.6  Dosing history: Date INR Dose 5/31 1.6 No dose given overnight in hospital 6/1 -- 7.5 mg   6/2       1.63     7.5 mg    Goal of Therapy:  INR 2-3 Monitor platelets by anticoagulation protocol: Yes   Plan:  INR is subtherapeutic. No bridge therapy at this time per MD. Will order warfarin 7.5 mg PO this evening - will give a higher dose since INR is subtherapeutic.   INR ordered with AM labs.  Larene Beach, PharmD Clinical Pharmacist 04/11/2017,8:02 AM

## 2017-04-11 NOTE — Discharge Instructions (Signed)
Heart healthy diet

## 2017-04-11 NOTE — Progress Notes (Signed)
Discharge  PIV removed and NA assisted pt into getting into a traveling gown. This RN spoke with [name] about pt's discharge and [name] felt uncomfortable getting her in/out the care with her limited mobility during her hospital stay. Case Management was contacted and form was completed for EMS. EMS was called and transportation home was arranged. RN will call [name] when they arrive to inform her of pt's departure and AVS paperwork. Pt has no complaints of pain at this time and is excited to go home.

## 2017-04-11 NOTE — Discharge Summary (Signed)
[facility] [place] NAME: Yolanda Wells    MR#:  [id number]  DATE OF BIRTH:  Mar 17, 1932  DATE OF ADMISSION:  04/09/2017   ADMITTING PHYSICIAN: Harrie Foreman, MD  DATE OF DISCHARGE: 04/11/2017  PRIMARY CARE PHYSICIAN: [name], MD   ADMISSION DIAGNOSIS:  Ileus ([place]) [K56.7] Lower abdominal pain [R10.30] Bilious vomiting with nausea [R11.14] Colostomy present ([place]) [Z93.3] DISCHARGE DIAGNOSIS:  Active Problems:   Ileus ([place])  SECONDARY DIAGNOSIS:   Past Medical History:  Diagnosis Date  . Arthritis   . Collagen vascular disease ([place])   . Deep venous thrombosis (HCC)    right lower extremity  . Dependent edema   . History of bilateral hip replacements   . History of hysterectomy   . Hypertension   . Polycythemia vera ([place])   . Stroke [facility])    Pt reports she was told she had a stroke in may   HOSPITAL COURSE:   This is an 83 year old female admitted for colonic ileus. 1.  Ileus: Improving, patient tolerated  regular food, advanced to solid diet. She was on IV hydration, IV nausea medicines. She has no complaints of abdominal pain, nausea, vomiting. She had stool output in the colostomy bag yesterday per RN. But the patient denied. Since the patient has no complaints, the patient will be discharged to home if has stool output in the colostomy bag.  #2. polycythemia with thrombocytosis: Continue hydroxyurea, patient already on warfarin: Continue that. #3. nausea: Continue IV nausea medicines. Improved. History of diverticular disease status post colostomy. Patient is wheelchair bound and she is from home.  DISCHARGE CONDITIONS:  Stable, discharge to home with resuming HH today. CONSULTS OBTAINED:   DRUG ALLERGIES:   Allergies  Allergen Reactions  . Sulfamethoxazole-Trimethoprim Nausea And Vomiting   DISCHARGE MEDICATIONS:   Allergies as of 04/11/2017      Reactions   Sulfamethoxazole-trimethoprim Nausea And  Vomiting      Medication List    TAKE these medications   aspirin EC 81 MG tablet Take 1 tablet (81 mg total) by mouth daily.   docusate sodium 100 MG capsule Commonly known as:  COLACE Take 100 mg by mouth 2 (two) times daily.   folic acid 326 MCG tablet Commonly known as:  FOLVITE Take 400 mcg by mouth daily.   hydroxyurea 500 MG capsule Commonly known as:  HYDREA Take  4 tablets daily What changed:  how much to take  how to take this  when to take this  additional instructions   metoprolol tartrate 25 MG tablet Commonly known as:  LOPRESSOR Take 12.5 mg by mouth daily.   Polyethylene Glycol Powd Take 17 g by mouth daily. What changed:  when to take this   potassium chloride SA 20 MEQ tablet Commonly known as:  K-DUR,KLOR-CON Take 1 tablet (20 mEq total) by mouth 2 (two) times daily.   warfarin 4 MG tablet Commonly known as:  COUMADIN Take 4 mg by mouth as directed. Tuesday, Thursday, Saturday and Sunday What changed:  Another medication with the same name was changed. Make sure you understand how and when to take each.   warfarin 5 MG tablet Commonly known as:  COUMADIN Take 1 tablet (5 mg total) by mouth daily. What changed:  when to take this  additional instructions        DISCHARGE INSTRUCTIONS:  See AVS.  If you experience worsening of your admission symptoms, develop shortness of breath, life threatening  emergency, suicidal or homicidal thoughts you must seek medical attention immediately by calling 911 or calling your MD immediately  if symptoms less severe.  You Must read complete instructions/literature along with all the possible adverse reactions/side effects for all the Medicines you take and that have been prescribed to you. Take any new Medicines after you have completely understood and accpet all the possible adverse reactions/side effects.   Please note  You were cared for by a hospitalist during your hospital stay. If you have  any questions about your discharge medications or the care you received while you were in the hospital after you are discharged, you can call the unit and asked to speak with the hospitalist on call if the hospitalist that took care of you is not available. Once you are discharged, your primary care physician will handle any further medical issues. Please note that NO REFILLS for any discharge medications will be authorized once you are discharged, as it is imperative that you return to your primary care physician (or establish a relationship with a primary care physician if you do not have one) for your aftercare needs so that they can reassess your need for medications and monitor your lab values.    On the day of Discharge:  VITAL SIGNS:  Blood pressure (!) 159/76, pulse 68, temperature 98 F (36.7 C), temperature source Oral, resp. rate 18, height 5\' 6"  (1.676 m), weight 214 lb 12.8 oz (97.4 kg), SpO2 94 %. PHYSICAL EXAMINATION:  GENERAL:  83 y.o.-year-old patient lying in the bed with no acute distress.  EYES: Pupils equal, round, reactive to light and accommodation. No scleral icterus. Extraocular muscles intact.  HEENT: Head atraumatic, normocephalic. Oropharynx and nasopharynx clear.  NECK:  Supple, no jugular venous distention. No thyroid enlargement, no tenderness.  LUNGS: Normal breath sounds bilaterally, no wheezing, rales,rhonchi or crepitation. No use of accessory muscles of respiration.  CARDIOVASCULAR: S1, S2 normal. No murmurs, rubs, or gallops.  ABDOMEN: Soft, non-tender, non-distended. Bowel sounds present. No organomegaly or mass. Colostomy bag in situ. EXTREMITIES: No pedal edema, cyanosis, or clubbing.  NEUROLOGIC: Cranial nerves II through XII are intact. Muscle strength 5/5 in all extremities. Sensation intact. Gait not checked.  PSYCHIATRIC: The patient is alert and oriented x 3.  SKIN: No obvious rash, lesion, or ulcer.  DATA REVIEW:   CBC  Recent Labs Lab  04/09/17 2140  WBC 2.3*  HGB 13.6  HCT 40.3  PLT 1,072*    Chemistries   Recent Labs Lab 04/09/17 2140  NA 139  K 4.1  CL 107  CO2 24  GLUCOSE 127*  BUN 13  CREATININE 0.59  CALCIUM 8.7*  AST 19  ALT 8*  ALKPHOS 74  BILITOT 0.6     Microbiology Results  Results for orders placed or performed during the hospital encounter of 02/23/17  Urine culture     Status: Abnormal   Collection Time: 02/23/17  2:14 PM  Result Value Ref Range Status   Specimen Description URINE, RANDOM  Final   Special Requests NONE  Final   Culture >=100,000 COLONIES/mL ESCHERICHIA COLI (A)  Final   Report Status 02/25/2017 FINAL  Final   Organism ID, Bacteria ESCHERICHIA COLI (A)  Final      Susceptibility   Escherichia coli - MIC*    AMPICILLIN >=32 RESISTANT Resistant     CEFAZOLIN <=4 SENSITIVE Sensitive     CEFTRIAXONE <=1 SENSITIVE Sensitive     CIPROFLOXACIN >=4 RESISTANT Resistant  GENTAMICIN <=1 SENSITIVE Sensitive     IMIPENEM <=0.25 SENSITIVE Sensitive     NITROFURANTOIN <=16 SENSITIVE Sensitive     TRIMETH/SULFA <=20 SENSITIVE Sensitive     AMPICILLIN/SULBACTAM 16 INTERMEDIATE Intermediate     PIP/TAZO <=4 SENSITIVE Sensitive     Extended ESBL NEGATIVE Sensitive     * >=100,000 COLONIES/mL ESCHERICHIA COLI  Culture, blood (routine x 2)     Status: None   Collection Time: 02/23/17  6:00 PM  Result Value Ref Range Status   Specimen Description BLOOD RIGHT ARM  Final   Special Requests   Final    BOTTLES DRAWN AEROBIC AND ANAEROBIC Blood Culture adequate volume   Culture NO GROWTH 5 DAYS  Final   Report Status 02/28/2017 FINAL  Final  Culture, blood (routine x 2)     Status: None   Collection Time: 02/23/17  6:00 PM  Result Value Ref Range Status   Specimen Description BLOOD RIGHT ASSIST CONTROL  Final   Special Requests   Final    BOTTLES DRAWN AEROBIC AND ANAEROBIC Blood Culture adequate volume   Culture NO GROWTH 5 DAYS  Final   Report Status 02/28/2017 FINAL  Final     RADIOLOGY:  No results found.   Management plans discussed with the patient, family and they are in agreement.  CODE STATUS: Full Code   TOTAL TIME TAKING CARE OF THIS PATIENT:28 minutes.    Demetrios Loll M.D on 04/11/2017 at 4:11 PM  Between 7am to 6pm - Pager - [phone]  After 6pm go to [email or web address] - Proofreader  [facility]  Office  [phone]  CC: Primary care physician; [name], MD   Note: This dictation was prepared with Dragon dictation along with smaller phrase technology. Any transcriptional errors that result from this process are unintentional.

## 2017-04-11 NOTE — Progress Notes (Signed)
EMS has arrived, RN called and informed Yolanda Wells and instructed her of AVS instructions and that the highlighted areas are what we went over. Pt able to stand-pivot to stretcher.

## 2017-04-12 ENCOUNTER — Emergency Department
Admission: EM | Admit: 2017-04-12 | Discharge: 2017-04-12 | Disposition: A | Discharge status: Home / Self Care | Payer: Medicare Other | Attending: Emergency Medicine | Admitting: Emergency Medicine

## 2017-04-12 DIAGNOSIS — Z79899 Other long term (current) drug therapy: Secondary | ICD-10-CM | POA: Diagnosis not present

## 2017-04-12 DIAGNOSIS — I10 Essential (primary) hypertension: Secondary | ICD-10-CM | POA: Diagnosis not present

## 2017-04-12 DIAGNOSIS — Z8673 Personal history of transient ischemic attack (TIA), and cerebral infarction without residual deficits: Secondary | ICD-10-CM | POA: Insufficient documentation

## 2017-04-12 DIAGNOSIS — R109 Unspecified abdominal pain: Secondary | ICD-10-CM | POA: Diagnosis not present

## 2017-04-12 DIAGNOSIS — J449 Chronic obstructive pulmonary disease, unspecified: Secondary | ICD-10-CM | POA: Insufficient documentation

## 2017-04-12 DIAGNOSIS — Z933 Colostomy status: Secondary | ICD-10-CM | POA: Diagnosis not present

## 2017-04-12 DIAGNOSIS — Z96643 Presence of artificial hip joint, bilateral: Secondary | ICD-10-CM | POA: Diagnosis not present

## 2017-04-12 DIAGNOSIS — Z7901 Long term (current) use of anticoagulants: Secondary | ICD-10-CM | POA: Insufficient documentation

## 2017-04-12 DIAGNOSIS — Z7982 Long term (current) use of aspirin: Secondary | ICD-10-CM | POA: Diagnosis not present

## 2017-04-12 NOTE — ED Notes (Signed)
Pt states she is w/c bound and will need an ambulance back home.

## 2017-04-12 NOTE — ED Notes (Signed)
Ems on scene for transport

## 2017-04-12 NOTE — ED Triage Notes (Signed)
Pt came to ED via EMS c/o left lower abdominal pain. Seen here 2 days ago for same complaint. Pt has colostomy bag and feels like she is putting out less than normal. VS stable.

## 2017-04-12 NOTE — ED Notes (Signed)
Pt does not want blood work done or imaging, tried to reach pts granddaughter. Left message

## 2017-04-12 NOTE — Discharge Instructions (Signed)
Please seek medical attention for any high fevers, chest pain, shortness of breath, change in behavior, persistent vomiting, bloody stool or any other new or concerning symptoms.  

## 2017-04-12 NOTE — ED Provider Notes (Signed)
[facility] Emergency Department Provider Note    ____________________________________________   I have reviewed the triage vital signs and the nursing notes.   HISTORY  Chief Complaint Abdominal Pain   History limited by: Not Limited   HPI Yolanda Wells is a 83 y.o. female who presents to the emergency department today because of concerns for abdominal pain, nausea and vomiting. Patient however states she has had no vomiting today. She states that these symptoms are the same symptoms she was admitted for and discharged yesterday for. She is concerned that her ostomy bag has had decreased output. There has been a small amount of brown watery output. The patient did have a CT scan performed here 3 days ago and one performed at [facility] emergency Department 4 days ago, neither of which showed a concerning cause of the patient's symptoms. She was discharged yesterday after IV fluids and Iv nausea medication.     Past Medical History:  Diagnosis Date  . Arthritis   . Collagen vascular disease ([place])   . Deep venous thrombosis (HCC)    right lower extremity  . Dependent edema   . History of bilateral hip replacements   . History of hysterectomy   . Hypertension   . Polycythemia vera ([place])   . Stroke [facility])    Pt reports she was told she had a stroke in may    Patient Active Problem List   Diagnosis Date Noted  . Ileus ([place]) 04/10/2017  . Influenza with respiratory manifestation 12/01/2016  . Respiratory distress 11/23/2016  . Urinary incontinence in female 09/02/2016  . Vision loss of right eye 08/26/2016  . Blindness of right eye 08/26/2016  . Inability to ambulate due to hip 10/12/2015  . Colostomy in place [facility]) 10/04/2015  . Chronic anticoagulation 10/03/2015  . Acute pulmonary embolism ([place]) 10/03/2015  . Leukocytosis 09/25/2015  . Chest pain 09/25/2015  . COPD exacerbation ([place]) 09/24/2015  . Right lower lobe pneumonia ([place]) 09/24/2015  . Chronic  obstructive pulmonary disease with acute exacerbation ([place]) 09/24/2015  . Dyspnea 09/22/2015  . Elevated troponin 09/15/2015  . UTI (lower urinary tract infection) 07/16/2015  . Abdominal wall cellulitis 07/15/2015  . DVT (deep venous thrombosis) ([place]) 07/15/2015  . HTN (hypertension) 07/15/2015  . Polycythemia vera ([place]) 07/15/2015  . Dependent edema 07/15/2015  . Arthritis 07/15/2015  . Pulmonary emboli ([place]) 05/25/2015  . Diverticulosis of colon with hemorrhage 04/02/2015  . Diverticulosis of intestine with bleeding 04/02/2015  . Dehydration, moderate 01/11/2015  . Fall 01/10/2015  . Hypertension 01/10/2015  . Osteoarthritis 01/10/2015  . Hammertoe 09/02/2012  . S/P transmetatarsal amputation of foot ([place]) 09/02/2012  . Onychomycosis 09/02/2012  . Other specified dermatoses 09/02/2012  . Pain in toe 09/02/2012  . Hip pain 08/23/2012  . S/P hip replacement 08/23/2012  . Glenohumeral arthritis 06/28/2012    Past Surgical History:  Procedure Laterality Date  . ABDOMINAL HYSTERECTOMY    . CARDIAC CATHETERIZATION Right 04/03/2015   Procedure: CENTRAL LINE INSERTION;  Surgeon: Sherri Rad, MD;  Location: [facility] ORS;  Service: General;  Laterality: Right;  . COLECTOMY WITH COLOSTOMY CREATION/HARTMANN PROCEDURE N/A 04/03/2015   Procedure: COLECTOMY WITH COLOSTOMY CREATION/HARTMANN PROCEDURE;  Surgeon: Sherri Rad, MD;  Location: [facility] ORS;  Service: General;  Laterality: N/A;  . JOINT REPLACEMENT     Left and Right Hip  . PERIPHERAL VASCULAR CATHETERIZATION N/A 04/02/2015   Procedure: Visceral Angiography;  Surgeon: Algernon Huxley, MD;  Location: [place] [facility];  Service: Cardiovascular;  Laterality: N/A;  . PERIPHERAL VASCULAR CATHETERIZATION N/A 04/02/2015   Procedure: Visceral Artery Intervention;  Surgeon: Algernon Huxley, MD;  Location: [place] [facility];  Service: Cardiovascular;  Laterality: N/A;  . PERIPHERAL VASCULAR CATHETERIZATION N/A 05/25/2015   Procedure: IVC Filter Insertion;   Surgeon: Katha Cabal, MD;  Location: [place] CV LAB;  Service: Cardiovascular;  Laterality: N/A;  . TOE AMPUTATION     right    Prior to Admission medications   Medication Sig Start Date End Date Taking? Authorizing Provider  aspirin EC 81 MG tablet Take 1 tablet (81 mg total) by mouth daily. 09/16/15   [name], MD  docusate sodium (COLACE) 100 MG capsule Take 100 mg by mouth 2 (two) times daily.     [provider]  folic acid (FOLVITE) 465 MCG tablet Take 400 mcg by mouth daily.    [provider]  hydroxyurea (HYDREA) 500 MG capsule Take  4 tablets daily Patient taking differently: Take 2,000 mg by mouth daily.  02/12/17   Lequita Asal, MD  metoprolol tartrate (LOPRESSOR) 25 MG tablet Take 12.5 mg by mouth daily.     [provider]  Polyethylene Glycol POWD Take 17 g by mouth daily. Patient taking differently: Take 17 g by mouth every other day.  11/22/15   Clayburn Pert, MD  potassium chloride SA (K-DUR,KLOR-CON) 20 MEQ tablet Take 1 tablet (20 mEq total) by mouth 2 (two) times daily. 07/20/15   Vaughan Basta, MD  warfarin (COUMADIN) 4 MG tablet Take 4 mg by mouth as directed. Tuesday, Thursday, Saturday and Sunday     [provider]  warfarin (COUMADIN) 5 MG tablet Take 1 tablet (5 mg total) by mouth daily. Patient taking differently: Take 5 mg by mouth 3 (three) times a week. Monday, Wednesday, Friday 03/13/17   Lequita Asal, MD    Allergies Sulfamethoxazole-trimethoprim  Family History  Problem Relation Age of Onset  . Heart attack Father   . COPD Father   . Hypertension Unknown   . Arthritis-Osteo Unknown   . Diabetes Brother   . Osteosarcoma Son     Social History Social History  Substance Use Topics  . Smoking status: Never Smoker  . Smokeless tobacco: Never Used  . Alcohol use No    Review of Systems Constitutional: No fever/chills Eyes: No visual changes. ENT: No sore  throat. Cardiovascular: Denies chest pain. Respiratory: Denies shortness of breath. Gastrointestinal: Positive for abdominal pain and nausea, no vomiting today. Neurological: Negative for headaches, focal weakness or numbness.  ____________________________________________   PHYSICAL EXAM:  VITAL SIGNS: ED Triage Vitals  Enc Vitals Group     BP 04/12/17 1536 (!) 142/74     Pulse Rate 04/12/17 1536 76     Resp 04/12/17 1536 16     Temp 04/12/17 1536 98.8 F (37.1 C)     Temp Source 04/12/17 1536 Oral     SpO2 04/12/17 1536 98 %     Weight 04/12/17 1537 214 lb (97.1 kg)     Height 04/12/17 1537 5\' 6"  (1.676 m)   Constitutional: Alert and oriented. Well appearing and in no distress. ENT   Head: Normocephalic and atraumatic.   Nose: No congestion/rhinnorhea.   Mouth/Throat: Mucous membranes are moist.   Neck: No stridor. Hematological/Lymphatic/Immunilogical: No cervical lymphadenopathy. Cardiovascular: Normal rate, regular rhythm.  No murmurs, rubs, or gallops.  Respiratory: Normal respiratory effort without tachypnea nor retractions. Breath sounds are clear and equal bilaterally. No wheezes/rales/rhonchi.  Gastrointestinal: Soft and non tender. Colostomy in place in left lower abdomen with small to moderate amount of brown discharge in bag.  Genitourinary: Deferred Musculoskeletal: Normal range of motion in all extremities. No lower extremity edema. Neurologic:  Normal speech and language. No gross focal neurologic deficits are appreciated.  Skin:  Skin is warm, dry and intact. No rash noted. Psychiatric: Mood and affect are normal. Speech and behavior are normal. Patient exhibits appropriate insight and judgment.  ____________________________________________    LABS (pertinent positives/negatives)  None  ____________________________________________   EKG  None  ____________________________________________     RADIOLOGY  None  ____________________________________________   PROCEDURES  Procedures  ____________________________________________   INITIAL IMPRESSION / ASSESSMENT AND PLAN / ED COURSE  Pertinent labs & imaging results that were available during my care of the patient were reviewed by me and considered in my medical decision making (see chart for details).  Patient presented to the emergency department today because of concerns for continued abdominal pain and nausea. Patient was discharged from the hospital yesterday with the same. Patient however at this time refused further blood work or repeat CT scan given history of same recently. I did discuss with the patient that without testing we couldn't tell her what was going on. I did discuss possibility of significant obstruction leading to bowel perforation possible death. Patient again refused blood work and CT scan. I discussed this with the patient's granddaughter. It sounds like a big concern is the lack of significant output. I reviewed the CT scans and neither showed concerning signs for bowel obstruction. Apparently the granddaughter spoke to the patient's primary care doctor, Dr. Janene Harvey, who wanted her reassessed. I offered and asked to talk to Dr. Janene Harvey to understand their concerns and why they wanted her reevaluated in the emergency department however patient's granddaughter did not feel comfortable giving me Dr. Burna Sis private number. Did offer to have her call Dr. Janene Harvey and then have Dr. Janene Harvey call the ED directly, however it was not clear if this was going to happen. At this point given patient's refusal of diagnosing testing, and reassuring exam, will have patient follow up with Dr. Janene Harvey as an outpatient.   ____________________________________________   FINAL CLINICAL IMPRESSION(S) / ED DIAGNOSES  Final diagnoses:  Abdominal pain, unspecified abdominal location  Presence of colostomy [facility])     Note:  This dictation was prepared with Dragon dictation. Any transcriptional errors that result from this process are unintentional     Nance Pear, MD 04/12/17 [phone]

## 2017-04-17 ENCOUNTER — Other Ambulatory Visit: Payer: Medicare Other

## 2017-04-17 ENCOUNTER — Inpatient Hospital Stay: Payer: Medicare Other

## 2017-04-21 ENCOUNTER — Other Ambulatory Visit: Payer: Self-pay | Admitting: *Deleted

## 2017-04-21 DIAGNOSIS — D45 Polycythemia vera: Secondary | ICD-10-CM

## 2017-04-21 MED ORDER — HYDROXYUREA 500 MG PO CAPS: ORAL_CAPSULE | ORAL | 1 refills | Status: DC

## 2017-04-23 ENCOUNTER — Ambulatory Visit: Payer: Medicare Other | Admitting: Hematology and Oncology

## 2017-04-23 ENCOUNTER — Other Ambulatory Visit: Payer: Medicare Other

## 2017-04-26 ENCOUNTER — Encounter: Payer: Self-pay | Admitting: Hematology and Oncology

## 2017-04-30 ENCOUNTER — Encounter: Payer: Self-pay | Admitting: *Deleted

## 2017-04-30 ENCOUNTER — Telehealth: Payer: Self-pay | Admitting: *Deleted

## 2017-04-30 ENCOUNTER — Inpatient Hospital Stay: Payer: Medicare Other

## 2017-04-30 ENCOUNTER — Inpatient Hospital Stay: Payer: Medicare Other | Admitting: Hematology and Oncology

## 2017-04-30 NOTE — Telephone Encounter (Signed)
Orders faxed to [phone] - [facility]

## 2017-04-30 NOTE — Progress Notes (Deleted)
[facility] day:  04/30/2017   Chief Complaint: Yolanda Wells is a 83 y.o. female with polycythemia rubra vera (PV) who is seen for 3 week assessment on hydroxyurea and Coumadin.  HPI:   The patient was last seen in the medical oncology clinic on 04/07/2017.  At that time, she felt "fine". She was taking hydroxyurea 2 gm/day (28 pills/week).  Hematocrit was 40.8.  Platelet count was 963,000.  INR was 2.24. We discussed consideration of P32.  She was to set up an appointment with Dr. Gaylyn Cheers at [facility].    Past Medical History:  Diagnosis Date  . Arthritis   . Collagen vascular disease ([place])   . Deep venous thrombosis (HCC)    right lower extremity  . Dependent edema   . History of bilateral hip replacements   . History of hysterectomy   . Hypertension   . Polycythemia vera ([place])   . Stroke [facility])    Pt reports she was told she had a stroke in may    Past Surgical History:  Procedure Laterality Date  . ABDOMINAL HYSTERECTOMY    . CARDIAC CATHETERIZATION Right 04/03/2015   Procedure: CENTRAL LINE INSERTION;  Surgeon: Sherri Rad, MD;  Location: [facility] ORS;  Service: General;  Laterality: Right;  . COLECTOMY WITH COLOSTOMY CREATION/HARTMANN PROCEDURE N/A 04/03/2015   Procedure: COLECTOMY WITH COLOSTOMY CREATION/HARTMANN PROCEDURE;  Surgeon: Sherri Rad, MD;  Location: [facility] ORS;  Service: General;  Laterality: N/A;  . JOINT REPLACEMENT     Left and Right Hip  . PERIPHERAL VASCULAR CATHETERIZATION N/A 04/02/2015   Procedure: Visceral Angiography;  Surgeon: Algernon Huxley, MD;  Location: [place] [facility];  Service: Cardiovascular;  Laterality: N/A;  . PERIPHERAL VASCULAR CATHETERIZATION N/A 04/02/2015   Procedure: Visceral Artery Intervention;  Surgeon: Algernon Huxley, MD;  Location: [place] [facility];  Service: Cardiovascular;  Laterality: N/A;  . PERIPHERAL VASCULAR CATHETERIZATION N/A 05/25/2015   Procedure: IVC Filter Insertion;  Surgeon: Katha Cabal, MD;  Location: [place] CV LAB;  Service: Cardiovascular;  Laterality: N/A;  . TOE AMPUTATION     right    Family History  Problem Relation Age of Onset  . Heart attack Father   . COPD Father   . Hypertension Unknown   . Arthritis-Osteo Unknown   . Diabetes Brother   . Osteosarcoma Son     Social History:  reports that she has never smoked. She has never used smokeless tobacco. She reports that she does not drink alcohol or use drugs.  She lives alone on [address] at [facility] (independent living).  She moved out of [facility].  She has been living with her grand daughter since 02/06/2017.  Her grand-daughter visits regularly Yolanda Wells [phone]).  The patient is accompanied by her grand daughter today.   Allergies:  Allergies  Allergen Reactions  . Sulfamethoxazole-Trimethoprim Nausea And Vomiting    Current Medications: Current Outpatient Prescriptions  Medication Sig Dispense Refill  . aspirin EC 81 MG tablet Take 1 tablet (81 mg total) by mouth daily. 30 tablet 2  . docusate sodium (COLACE) 100 MG capsule Take 100 mg by mouth 2 (two) times daily.     . folic acid (FOLVITE) 010 MCG tablet Take 400 mcg by mouth daily.    . hydroxyurea (HYDREA) 500 MG capsule Take  4 tablets daily 120 capsule 1  . metoprolol tartrate (LOPRESSOR) 25 MG tablet Take 12.5 mg by mouth daily.     [name]  Polyethylene Glycol POWD Take 17 g by mouth daily. (Patient taking differently: Take 17 g by mouth every other day. ) 527 g 5  . potassium chloride SA (K-DUR,KLOR-CON) 20 MEQ tablet Take 1 tablet (20 mEq total) by mouth 2 (two) times daily. 30 tablet 0  . warfarin (COUMADIN) 4 MG tablet Take 4 mg by mouth as directed. Tuesday, Thursday, Saturday and Sunday     . warfarin (COUMADIN) 5 MG tablet Take 1 tablet (5 mg total) by mouth daily. (Patient taking differently: Take 5 mg by mouth 3 (three) times a week. Monday, Wednesday, Friday) 30 tablet 3   No current facility-administered  medications for this visit.     Review of Systems:  GENERAL:  Feels "ok".  No fevers or sweats.  No weight loss. PERFORMANCE STATUS (ECOG):  3 HEENT:  Decreased vision in right eye s/p embolic event.  Seeing better s/p entropion surgery.  No runny nose, sore throat, mouth sores or tenderness. Lungs:  No shortness of breath or cough.  No hemoptysis. Cardiac:  No chest pain, palpitations, orthopnea, or PND. GI:  No nausea, vomiting, diarrhea, constipation, melena or hematochezia.  Interval blood in stool, resolved. GU:  No urgency, frequency, dysuria, or hematuria.  Wears adult diapers. Musculoskeletal:  Knee problems ("bone on bone").  No muscle tenderness. Extremities:  Chronic swelling in legs. Skin:  Dry skin. Neuro:  No headache, numbness or weakness, balance or coordination issues. Endocrine:  No diabetes, thyroid issues, hot flashes or night sweats. Psych:  No mood changes, depression or anxiety. Pain:  No focal pain. Review of systems:  All other systems reviewed and found to be negative.  Physical Exam: There were no vitals taken for this visit. GENERAL:  Elderly woman sitting comfortably in a wheelchair in the exam room in no acute distress. MENTAL STATUS:  Alert and oriented to person, place and time. HEAD: Pearline Cables hair.  Normocephalic, atraumatic, face symmetric, no Cushingoid features. EYES:  Brown eyes.  Right sided entropion.  Pupils equal round and reactive to light and accomodation.  No conjunctivitis or scleral icterus. ENT:  Oropharynx clear without lesion.  Upper dentures.  Tongue normal. Mucous membranes moist.  RESPIRATORY:  Clear to auscultation without rales, wheezes or rhonchi. CARDIOVASCULAR:  Regular rate and rhythm without murmur, rub or gallop. ABDOMEN:  Left sided colostomy.  Stool brown.  Soft, non-tender, with active bowel sounds, and no appreciable hepatosplenomegaly.  No masses. SKIN:  Right side of face thickened and dark.  Small suture s/p biopsy.  No  ulcers . EXTREMITIES:  Chronic lower extremity changes.  No skin discoloration or tenderness.  No palpable cords. LYMPH NODES: No palpable cervical, supraclavicular, axillary or inguinal adenopathy  NEUROLOGICAL: Unremarkable. PSYCH:  Appropriate.   No visits with results within 3 Day(s) from this visit.  Latest known visit with results is:  Admission on 04/09/2017, Discharged on 04/11/2017  Component Date Value Ref Range Status  . Lipase 04/09/2017 17  11 - 51 U/L Final  . Sodium 04/09/2017 139  135 - 145 mmol/L Final  . Potassium 04/09/2017 4.1  3.5 - 5.1 mmol/L Final  . Chloride 04/09/2017 107  101 - 111 mmol/L Final  . CO2 04/09/2017 24  22 - 32 mmol/L Final  . Glucose, Bld 04/09/2017 127* 65 - 99 mg/dL Final  . BUN 04/09/2017 13  6 - 20 mg/dL Final  . Creatinine, Ser 04/09/2017 0.59  0.44 - 1.00 mg/dL Final  . Calcium 04/09/2017 8.7* 8.9 - 10.3 mg/dL Final  .  Total Protein 04/09/2017 7.3  6.5 - 8.1 g/dL Final  . Albumin 66/48/6161 3.2* 3.5 - 5.0 g/dL Final  . AST 22/40/0180 19  15 - 41 U/L Final  . ALT 04/09/2017 8* 14 - 54 U/L Final  . Alkaline Phosphatase 04/09/2017 74  38 - 126 U/L Final  . Total Bilirubin 04/09/2017 0.6  0.3 - 1.2 mg/dL Final  . GFR calc non Af Amer 04/09/2017 >60  >60 mL/min Final  . GFR calc Af Amer 04/09/2017 >60  >60 mL/min Final   Comment: (NOTE) The eGFR has been calculated using the CKD EPI equation. This calculation has not been validated in all clinical situations. eGFR's persistently <60 mL/min signify possible Chronic Kidney Disease.   . Anion gap 04/09/2017 8  5 - 15 Final  . WBC 04/09/2017 2.3* 3.6 - 11.0 K/uL Final  . RBC 04/09/2017 4.57  3.80 - 5.20 MIL/uL Final  . Hemoglobin 04/09/2017 13.6  12.0 - 16.0 g/dL Final  . HCT 97/02/4924 40.3  35.0 - 47.0 % Final  . MCV 04/09/2017 88.1  80.0 - 100.0 fL Final  . MCH 04/09/2017 29.7  26.0 - 34.0 pg Final  . MCHC 04/09/2017 33.7  32.0 - 36.0 g/dL Final  . RDW 24/15/9017 35.7* 11.5 - 14.5 %  Final  . Platelets 04/09/2017 1072* 150 - 440 K/uL Final   Comment: PLATELET CLUMPS NOTED ON SMEAR, UNABLE TO ESTIMATE CRITICAL RESULT CALLED TO, READ BACK BY AND VERIFIED WITH: ALLISON PATE @ 2350 ON 04/09/2017 BY CAF   . Color, Urine 04/09/2017 YELLOW* YELLOW Final  . APPearance 04/09/2017 CLEAR* CLEAR Final  . Specific Gravity, Urine 04/09/2017 1.024  1.005 - 1.030 Final  . pH 04/09/2017 5.0  5.0 - 8.0 Final  . Glucose, UA 04/09/2017 NEGATIVE  NEGATIVE mg/dL Final  . Hgb urine dipstick 04/09/2017 NEGATIVE  NEGATIVE Final  . Bilirubin Urine 04/09/2017 NEGATIVE  NEGATIVE Final  . Ketones, ur 04/09/2017 NEGATIVE  NEGATIVE mg/dL Final  . Protein, ur 24/19/5424 NEGATIVE  NEGATIVE mg/dL Final  . Nitrite 81/44/3926 NEGATIVE  NEGATIVE Final  . Leukocytes, UA 04/09/2017 NEGATIVE  NEGATIVE Final  . RBC / HPF 04/09/2017 0-5  0 - 5 RBC/hpf Final  . WBC, UA 04/09/2017 0-5  0 - 5 WBC/hpf Final  . Bacteria, UA 04/09/2017 NONE SEEN  NONE SEEN Final  . Squamous Epithelial / LPF 04/09/2017 0-5* NONE SEEN Final  . Mucous 04/09/2017 PRESENT   Final  . Prothrombin Time 04/09/2017 19.3* 11.4 - 15.2 seconds Final  . INR 04/09/2017 1.61   Final  . TSH 04/09/2017 2.553  0.350 - 4.500 uIU/mL Final   Performed by a 3rd Generation assay with a functional sensitivity of <=0.01 uIU/mL.  . Hgb A1c MFr Bld 04/09/2017 5.3  4.8 - 5.6 % Final   Comment: (NOTE)         Pre-diabetes: 5.7 - 6.4         Diabetes: >6.4         Glycemic control for adults with diabetes: <7.0   . Mean Plasma Glucose 04/09/2017 105  mg/dL Final   Comment: (NOTE) Performed At: [facility] [address] [place], [place] [id number] Mila Homer MD GO:[id number]   . Prothrombin Time 04/11/2017 19.5* 11.4 - 15.2 seconds Final  . INR 04/11/2017 1.63   Final    Assessment:  Yolanda Wells is a 83 y.o. female African-American woman with JAK2+ polycythemia rubra vera (PV) previously on a phlebotomy program and hydroxyurea.  She  received P32 in an attempt to manage her counts in 03/2015.   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 She was diagnosed with bilateral lower extremity DVTs on 05/18/2015 and bilateral pulmonary emboli on 05/24/2015. She underwent IVC filter placement on 05/25/2015.  She has been on a fluctuating dose of Coumadin secondary to unstable INR.  She is on a baby aspirin.  She developed progressive erythrocytosis, thrombocytosis, and leukocytosis.  She underwent phlebotomy for a hematocrit of 55.4 on 09/23/2015.  Hematocrit decreased to 50.0, but has again increased after initiation of oral iron.  Platelet count has increased from 1.1 million to 1.4 million.  White count increased from 23,000 - 28,000 to 31,800.  She was admitted on 08/25/2016 with loss of vision in her right eye.  CBC on 08/25/2016 revealed a hematocrit of 40.0, hemoglobin 13.5, MCV 91, platelets 842,000, WBC 4900.  INR was 2.59.  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  MRI and MRA showed no acute changes.  She has been back on hydroxyurea since 10/02/2015.  Initial dose was 1000 mg a day.  She is currently taking 4 pills 3 days/week and 3 pills 4 days/week (total weekly dose: 24 pills).   She requires periodic phlebotomies (goal hematocrit <= 42).  Platelet count remains elevated (secondary to PV and likely some component of iron deficiency).  Goal platelet count is 400,000.  She is on Coumadin 5 mg 3 days/week and 4 mg 4 days/week (total weekly dose 31 mg).  INR goal 2-3.  Symptomatically, she feels "fine". She is taking hydroxyurea 2 gm/day (28 pills/week).  Exam reveals chronic right sided entropion.  Hematocrit was 40.8.  Platelet count was 963,000.  INR was 2.24.   Plan: 1.  Labs today:  CBC with diff, PT/INR.  iscuss recent trend in counts. The platelet count remains markedly  elevated on of hydroxyurea (4 pills a day). Goal platelet count remains 400,000.  Discuss prior conversation with patient (grand daughter was not present at that time).  Discuss consideration of P32.  Discuss follow-up at [facility] with Dr. Gaylyn Cheers. 2.  No phlebotomy today.  3.  Continue hydroxyurea 4 pills/day (28 pills per week).  4.  Continue Coumadin. 5.  Phone contact with Dr. Sterling Big ([phone]). 6.  Patient has appt on Fridays 06/01 and 06/08 for labs. 7.  Patient to schedule appt with Dr. Gaylyn Cheers at [facility]. 8.  RTC in 3 weeks for MD assessment and labs (CBC with diff, PT/INR).   Lequita Asal, MD  04/30/2017, 5:18 AM

## 2017-04-30 NOTE — Telephone Encounter (Signed)
Granddaughter called to request we send orders to [facility] for PT INR draws weekly so that she  Does not have to load her in car every week to bring her in for labs. Please advise  She has cancelled or No Showed for multiple appts, most recently 04/30/17 lab md appt

## 2017-04-30 NOTE — Telephone Encounter (Signed)
  I agree.  Please provide form to sign.  M

## 2017-05-08 ENCOUNTER — Emergency Department
Admission: EM | Admit: 2017-05-08 | Discharge: 2017-05-08 | Disposition: A | Discharge status: Home / Self Care | Payer: Medicare Other | Attending: Emergency Medicine | Admitting: Emergency Medicine

## 2017-05-08 DIAGNOSIS — D473 Essential (hemorrhagic) thrombocythemia: Secondary | ICD-10-CM | POA: Diagnosis not present

## 2017-05-08 DIAGNOSIS — K9401 Colostomy hemorrhage: Secondary | ICD-10-CM | POA: Diagnosis not present

## 2017-05-08 DIAGNOSIS — I10 Essential (primary) hypertension: Secondary | ICD-10-CM | POA: Diagnosis not present

## 2017-05-08 DIAGNOSIS — Z8673 Personal history of transient ischemic attack (TIA), and cerebral infarction without residual deficits: Secondary | ICD-10-CM | POA: Insufficient documentation

## 2017-05-08 DIAGNOSIS — Z7901 Long term (current) use of anticoagulants: Secondary | ICD-10-CM | POA: Diagnosis not present

## 2017-05-08 DIAGNOSIS — Z7982 Long term (current) use of aspirin: Secondary | ICD-10-CM | POA: Insufficient documentation

## 2017-05-08 DIAGNOSIS — J449 Chronic obstructive pulmonary disease, unspecified: Secondary | ICD-10-CM | POA: Diagnosis not present

## 2017-05-08 DIAGNOSIS — Z79899 Other long term (current) drug therapy: Secondary | ICD-10-CM | POA: Insufficient documentation

## 2017-05-08 DIAGNOSIS — D75839 Thrombocytosis, unspecified: Secondary | ICD-10-CM

## 2017-05-08 LAB — CBC WITH DIFFERENTIAL/PLATELET
Basophils Absolute: 0 10*3/uL (ref 0–0.1)
Basophils Relative: 1 %
EOS PCT: 2 %
Eosinophils Absolute: 0.1 10*3/uL (ref 0–0.7)
HCT: 41.1 % (ref 35.0–47.0)
Hemoglobin: 13.5 g/dL (ref 12.0–16.0)
LYMPHS ABS: 1 10*3/uL (ref 1.0–3.6)
Lymphocytes Relative: 31 %
MCH: 29.8 pg (ref 26.0–34.0)
MCHC: 32.9 g/dL (ref 32.0–36.0)
MCV: 90.4 fL (ref 80.0–100.0)
MONO ABS: 0.1 10*3/uL — AB (ref 0.2–0.9)
MONOS PCT: 3 %
Neutro Abs: 2 10*3/uL (ref 1.4–6.5)
Neutrophils Relative %: 63 %
PLATELETS: 872 10*3/uL — AB (ref 150–440)
RBC: 4.54 MIL/uL (ref 3.80–5.20)
RDW: 35.6 % — AB (ref 11.5–14.5)
WBC: 3.2 10*3/uL — ABNORMAL LOW (ref 3.6–11.0)

## 2017-05-08 LAB — BASIC METABOLIC PANEL
Anion gap: 7 (ref 5–15)
BUN: 13 mg/dL (ref 6–20)
CHLORIDE: 108 mmol/L (ref 101–111)
CO2: 24 mmol/L (ref 22–32)
Calcium: 8.7 mg/dL — ABNORMAL LOW (ref 8.9–10.3)
Creatinine, Ser: 0.91 mg/dL (ref 0.44–1.00)
GFR calc Af Amer: >60 mL/min (ref >60)
GFR calc non Af Amer: 57 mL/min — ABNORMAL LOW (ref >60)
GLUCOSE: 119 mg/dL — AB (ref 65–99)
POTASSIUM: 4.4 mmol/L (ref 3.5–5.1)
Sodium: 139 mmol/L (ref 135–145)

## 2017-05-08 NOTE — Discharge Instructions (Signed)
Results for orders placed or performed during the hospital encounter of 43/15/40  Basic metabolic panel  Result Value Ref Range   Sodium 139 135 - 145 mmol/L   Potassium 4.4 3.5 - 5.1 mmol/L   Chloride 108 101 - 111 mmol/L   CO2 24 22 - 32 mmol/L   Glucose, Bld 119 (H) 65 - 99 mg/dL   BUN 13 6 - 20 mg/dL   Creatinine, Ser 0.91 0.44 - 1.00 mg/dL   Calcium 8.7 (L) 8.9 - 10.3 mg/dL   GFR calc non Af Amer 57 (L) >60 mL/min   GFR calc Af Amer >60 >60 mL/min   Anion gap 7 5 - 15  CBC with Differential/Platelet  Result Value Ref Range   WBC 3.2 (L) 3.6 - 11.0 K/uL   RBC 4.54 3.80 - 5.20 MIL/uL   Hemoglobin 13.5 12.0 - 16.0 g/dL   HCT 41.1 35.0 - 47.0 %   MCV 90.4 80.0 - 100.0 fL   MCH 29.8 26.0 - 34.0 pg   MCHC 32.9 32.0 - 36.0 g/dL   RDW 35.6 (H) 11.5 - 14.5 %   Platelets 872 (H) 150 - 440 K/uL   Neutrophils Relative % 63 %   Neutro Abs 2.0 1.4 - 6.5 K/uL   Lymphocytes Relative 31 %   Lymphs Abs 1.0 1.0 - 3.6 K/uL   Monocytes Relative 3 %   Monocytes Absolute 0.1 (L) 0.2 - 0.9 K/uL   Eosinophils Relative 2 %   Eosinophils Absolute 0.1 0 - 0.7 K/uL   Basophils Relative 1 %   Basophils Absolute 0.0 0 - 0.1 K/uL   Ct Abdomen Pelvis W Contrast  Result Date: 04/09/2017 CLINICAL DATA:  Acute onset of generalized abdominal pain, nausea and vomiting. Confusion. Initial encounter. EXAM: CT ABDOMEN AND PELVIS WITH CONTRAST TECHNIQUE: Multidetector CT imaging of the abdomen and pelvis was performed using the standard protocol following bolus administration of intravenous contrast. CONTRAST:  158mL ISOVUE-300 IOPAMIDOL (ISOVUE-300) INJECTION 61% COMPARISON:  CT of the abdomen and pelvis from 11/27/2015 FINDINGS: Lower chest: Minimal bibasilar atelectasis or scarring is noted. Dense calcification is noted at the mitral valve. A tiny hiatal hernia is noted. Hepatobiliary: The liver is grossly unremarkable in appearance. Stones are seen dependently within the gallbladder. The gallbladder is  otherwise unremarkable. The common bile duct remains normal in caliber. Pancreas: The pancreas is within normal limits. Spleen: The spleen is unremarkable in appearance. Adrenals/Urinary Tract: The adrenal glands are grossly unremarkable in appearance. Large right renal cysts are noted, measuring up to 8.5 cm in size. Right renal scarring is noted. There is no evidence of hydronephrosis. No renal or ureteral stones are identified. No perinephric stranding is appreciated. Stomach/Bowel: The stomach is unremarkable in appearance. The small bowel is within normal limits. The appendix is normal in caliber, without evidence of appendicitis. The patient's left lower quadrant colostomy at the mid transverse colon is grossly unremarkable in appearance. The distal descending and sigmoid colon are completely decompressed, with mild underlying diverticulosis. Vascular/Lymphatic: Scattered calcification is seen along the abdominal aorta and its branches. The abdominal aorta is otherwise grossly unremarkable. The inferior vena cava is grossly unremarkable. No retroperitoneal lymphadenopathy is seen. No pelvic sidewall lymphadenopathy is identified. Reproductive: The bladder is not well assessed due to metal artifact. The patient is status post hysterectomy. No suspicious adnexal masses are seen. Other: No additional soft tissue abnormalities are seen. Musculoskeletal: No acute osseous abnormalities are identified. There is mild grade 1 anterolisthesis of L4 on  L5, reflecting underlying facet disease. Multilevel vacuum phenomenon is noted along the lumbar spine. Bilateral hip arthroplasties are grossly unremarkable in appearance. The visualized musculature is unremarkable in appearance. IMPRESSION: 1. Left lower quadrant colostomy is grossly unremarkable in appearance. 2. Distal descending and sigmoid colon are decompressed, with mild underlying diverticulosis. 3. Scattered aortic atherosclerosis. 4. Dense calcification at the  mitral valve. 5. Tiny hiatal hernia. 6. Cholelithiasis.  Gallbladder otherwise grossly unremarkable. 7. Large right renal cysts.  Right renal scarring. Electronically Signed   By: Garald Balding M.D.   On: 04/09/2017 23:44

## 2017-05-08 NOTE — ED Triage Notes (Signed)
Pt came to ED via EMS from home c/o blood in colostomy bag. No blood noted on arrival. Pt denies pain.  VS stable.

## 2017-05-08 NOTE — ED Provider Notes (Signed)
[facility] Emergency Department Provider Note  ____________________________________________  Time seen: Approximately 8:59 PM  I have reviewed the triage vital signs and the nursing notes.   HISTORY  Chief Complaint blood in colostomy bag    HPI Yolanda Wells is a 83 y.o. female brought to the ED by daughter due to bleeding from her stoma site from her colostomy. No change in stool output. Stool was not black. Patient denies any complaints at all. No pain. No vomiting dizziness or syncope. No fatigue or chills. No abdominal pain.     Past Medical History:  Diagnosis Date  . Anticoagulant long-term use   . Arthritis   . Collagen vascular disease ([place])   . Deep venous thrombosis (H[facility])    right lower extremity  . Dependent edema   . History of bilateral hip replacements   . History of hysterectomy   . Hypertension   . Polycythemia vera ([place])   . Stroke [facility])    Pt reports she was told she had a stroke in may     Patient Active Problem List   Diagnosis Date Noted  . Ileus ([place]) 04/10/2017  . Influenza with respiratory manifestation 12/01/2016  . Respiratory distress 11/23/2016  . Urinary incontinence in female 09/02/2016  . Vision loss of right eye 08/26/2016  . Blindness of right eye 08/26/2016  . Inability to ambulate due to hip 10/12/2015  . Colostomy in place [facility]) 10/04/2015  . Chronic anticoagulation 10/03/2015  . Acute pulmonary embolism ([place]) 10/03/2015  . Leukocytosis 09/25/2015  . Chest pain 09/25/2015  . COPD exacerbation ([place]) 09/24/2015  . Right lower lobe pneumonia ([place]) 09/24/2015  . Chronic obstructive pulmonary disease with acute exacerbation ([place]) 09/24/2015  . Dyspnea 09/22/2015  . Elevated troponin 09/15/2015  . UTI (lower urinary tract infection) 07/16/2015  . Abdominal wall cellulitis 07/15/2015  . DVT (deep venous thrombosis) ([place]) 07/15/2015  . HTN (hypertension) 07/15/2015  . Polycythemia vera ([place]) 07/15/2015   . Dependent edema 07/15/2015  . Arthritis 07/15/2015  . Pulmonary emboli ([place]) 05/25/2015  . Diverticulosis of colon with hemorrhage 04/02/2015  . Diverticulosis of intestine with bleeding 04/02/2015  . Dehydration, moderate 01/11/2015  . Fall 01/10/2015  . Hypertension 01/10/2015  . Osteoarthritis 01/10/2015  . Hammertoe 09/02/2012  . S/P transmetatarsal amputation of foot ([place]) 09/02/2012  . Onychomycosis 09/02/2012  . Other specified dermatoses 09/02/2012  . Pain in toe 09/02/2012  . Hip pain 08/23/2012  . S/P hip replacement 08/23/2012  . Glenohumeral arthritis 06/28/2012     Past Surgical History:  Procedure Laterality Date  . ABDOMINAL HYSTERECTOMY    . CARDIAC CATHETERIZATION Right 04/03/2015   Procedure: CENTRAL LINE INSERTION;  Surgeon: Sherri Rad, MD;  Location: [facility] ORS;  Service: General;  Laterality: Right;  . COLECTOMY WITH COLOSTOMY CREATION/HARTMANN PROCEDURE N/A 04/03/2015   Procedure: COLECTOMY WITH COLOSTOMY CREATION/HARTMANN PROCEDURE;  Surgeon: Sherri Rad, MD;  Location: [facility] ORS;  Service: General;  Laterality: N/A;  . JOINT REPLACEMENT     Left and Right Hip  . PERIPHERAL VASCULAR CATHETERIZATION N/A 04/02/2015   Procedure: Visceral Angiography;  Surgeon: Algernon Huxley, MD;  Location: [place] CV LAB;  Service: Cardiovascular;  Laterality: N/A;  . PERIPHERAL VASCULAR CATHETERIZATION N/A 04/02/2015   Procedure: Visceral Artery Intervention;  Surgeon: Algernon Huxley, MD;  Location: [place] CV LAB;  Service: Cardiovascular;  Laterality: N/A;  . PERIPHERAL VASCULAR CATHETERIZATION N/A 05/25/2015   Procedure: IVC Filter Insertion;  Surgeon: Katha Cabal, MD;  Location:  [place] CV LAB;  Service: Cardiovascular;  Laterality: N/A;  . TOE AMPUTATION     right     Prior to Admission medications   Medication Sig Start Date End Date Taking? Authorizing Provider  aspirin EC 81 MG tablet Take 1 tablet (81 mg total) by mouth daily. 09/16/15   [name], MD  docusate sodium (COLACE) 100 MG capsule Take 100 mg by mouth 2 (two) times daily.     [provider]  folic acid (FOLVITE) 161 MCG tablet Take 400 mcg by mouth daily.    [provider]  hydroxyurea (HYDREA) 500 MG capsule Take  4 tablets daily 04/21/17   Lequita Asal, MD  metoprolol tartrate (LOPRESSOR) 25 MG tablet Take 12.5 mg by mouth daily.     [provider]  Polyethylene Glycol POWD Take 17 g by mouth daily. Patient taking differently: Take 17 g by mouth every other day.  11/22/15   Clayburn Pert, MD  potassium chloride SA (K-DUR,KLOR-CON) 20 MEQ tablet Take 1 tablet (20 mEq total) by mouth 2 (two) times daily. 07/20/15   Vaughan Basta, MD  warfarin (COUMADIN) 4 MG tablet Take 4 mg by mouth as directed. Tuesday, Thursday, Saturday and Sunday     [provider]  warfarin (COUMADIN) 5 MG tablet Take 1 tablet (5 mg total) by mouth daily. Patient taking differently: Take 5 mg by mouth 3 (three) times a week. Monday, Wednesday, Friday 03/13/17   Lequita Asal, MD     Allergies Sulfamethoxazole-trimethoprim   Family History  Problem Relation Age of Onset  . Heart attack Father   . COPD Father   . Hypertension Unknown   . Arthritis-Osteo Unknown   . Diabetes Brother   . Osteosarcoma Son     Social History Social History  Substance Use Topics  . Smoking status: Never Smoker  . Smokeless tobacco: Never Used  . Alcohol use No    Review of Systems  Constitutional:   No fever or chills.  ENT:   No sore throat. No rhinorrhea. Cardiovascular:   No chest pain or syncope. Respiratory:   No dyspnea or cough. Gastrointestinal:   Negative for abdominal pain, vomiting and diarrhea.  Musculoskeletal:   Negative for focal pain or swelling All other systems reviewed and are negative except as documented above in ROS and HPI.  ____________________________________________   PHYSICAL EXAM:  VITAL SIGNS: ED Triage  Vitals  Enc Vitals Group     BP 05/08/17 1735 118/73     Pulse Rate 05/08/17 1735 67     Resp 05/08/17 1735 16     Temp 05/08/17 1735 97.9 F (36.6 C)     Temp Source 05/08/17 1735 Oral     SpO2 05/08/17 1735 94 %     Weight 05/08/17 1736 214 lb (97.1 kg)     Height 05/08/17 1736 5\' 6"  (1.676 m)     Head Circumference --      Peak Flow --      Pain Score --      Pain Loc --      Pain Edu? --      Excl. in [place]? --     Vital signs reviewed, nursing assessments reviewed.   Constitutional:   Alert and oriented. Well appearing and in no distress. Eyes:   No scleral icterus.  EOMI. No nystagmus. No conjunctival pallor. PERRL. ENT   Head:   Normocephalic and atraumatic.   Nose:   No congestion/rhinnorhea.  Mouth/Throat:   MMM, no pharyngeal erythema. No peritonsillar mass.    Neck:   No meningismus. Full ROM Hematological/Lymphatic/Immunilogical:   No cervical lymphadenopathy. Cardiovascular:   RRR. Symmetric bilateral radial and DP pulses.  No murmurs.  Respiratory:   Normal respiratory effort without tachypnea/retractions. Breath sounds are clear and equal bilaterally. No wheezes/rales/rhonchi. Gastrointestinal:   Soft and nontender. Non distended. There is no CVA tenderness.  No rebound, rigidity, or guarding. Stoma present in the left lower quadrant. No prolapse, swelling, or tenderness. No bleeding noted. Bag removed for inspection, no bleeding. Stool tested, Hemoccult negative with controls okay. Genitourinary:   deferred Musculoskeletal:   Normal range of motion in all extremities. No joint effusions.  No lower extremity tenderness.  No edema. Neurologic:   Normal speech and language.  Motor grossly intact. No gross focal neurologic deficits are appreciated.  Skin:    Skin is warm, dry and intact. No rash noted.  No petechiae, purpura, or bullae.  ____________________________________________    LABS (pertinent positives/negatives) (all labs ordered are  listed, but only abnormal results are displayed) Labs Reviewed  BASIC METABOLIC PANEL - Abnormal; Notable for the following:       Result Value   Glucose, Bld 119 (*)    Calcium 8.7 (*)    GFR calc non Af Amer 57 (*)    All other components within normal limits  CBC WITH DIFFERENTIAL/PLATELET - Abnormal; Notable for the following:    WBC 3.2 (*)    RDW 35.6 (*)    Platelets 872 (*)    Monocytes Absolute 0.1 (*)    All other components within normal limits   ____________________________________________   EKG    ____________________________________________    RADIOLOGY  No results found.  ____________________________________________   PROCEDURES Procedures  ____________________________________________   INITIAL IMPRESSION / ASSESSMENT AND PLAN / ED COURSE  Pertinent labs & imaging results that were available during my care of the patient were reviewed by me and considered in my medical decision making (see chart for details).  Patient presents for evaluation of bleeding from her stoma. Daughter reports is right at the surface. It now seems to be resolved. Hemoccult negative, hemoglobin stable, vitals normal. We'll discharge home to follow up with her surgery clinic.      ____________________________________________   FINAL CLINICAL IMPRESSION(S) / ED DIAGNOSES  Final diagnoses:  Bleeding from colostomy stoma ([place])  Thrombocytosis ([place])      New Prescriptions   No medications on file     Portions of this note were generated with dragon dictation software. Dictation errors may occur despite best attempts at proofreading.    Carrie Mew, MD 05/08/17 2103

## 2017-05-11 ENCOUNTER — Telehealth: Payer: Self-pay | Admitting: General Surgery

## 2017-05-11 NOTE — Telephone Encounter (Signed)
I have called patient to make an appointment per ED referral for her ED visit on 05/08/17 for Bleeding from Colostomy Site. No answer. I have left a message on voicemail.

## 2017-05-12 ENCOUNTER — Encounter: Payer: Self-pay | Admitting: General Surgery

## 2017-05-12 NOTE — Telephone Encounter (Signed)
I have called patient again to make an appointment. No answer. I have left a message on voicemail. I have mailed a letter to the address provided in the chart.

## 2017-05-19 ENCOUNTER — Telehealth: Payer: Self-pay | Admitting: *Deleted

## 2017-05-19 NOTE — Telephone Encounter (Signed)
Grand daughter called requesting we send an order to [facility] for them to check CBC with her blood draw this week since she had a NM inj las week to see where her platelet count is now. Please fax order if in agreeement

## 2017-05-19 NOTE — Telephone Encounter (Signed)
[name] notified by leaving message that Evelena Asa, RN will be faxing CBC order

## 2017-05-19 NOTE — Telephone Encounter (Signed)
  Sounds fine.  M 

## 2017-05-20 ENCOUNTER — Telehealth: Payer: Self-pay | Admitting: *Deleted

## 2017-05-20 NOTE — Telephone Encounter (Signed)
Called Anisa at [facility] home care and ordered a CBC with Diff during this weeks lab draw per Dr. Mike Gip.

## 2017-05-23 ENCOUNTER — Emergency Department: Payer: Medicare Other

## 2017-05-23 ENCOUNTER — Inpatient Hospital Stay
Admission: EM | Admit: 2017-05-23 | Discharge: 2017-05-26 | DRG: 689 | Disposition: A | Discharge status: Home Health | Payer: Medicare Other | Attending: Internal Medicine | Admitting: Internal Medicine

## 2017-05-23 ENCOUNTER — Encounter: Payer: Self-pay | Admitting: Emergency Medicine

## 2017-05-23 DIAGNOSIS — Z882 Allergy status to sulfonamides status: Secondary | ICD-10-CM | POA: Diagnosis not present

## 2017-05-23 DIAGNOSIS — Z86711 Personal history of pulmonary embolism: Secondary | ICD-10-CM

## 2017-05-23 DIAGNOSIS — J9811 Atelectasis: Secondary | ICD-10-CM | POA: Diagnosis present

## 2017-05-23 DIAGNOSIS — Z792 Long term (current) use of antibiotics: Secondary | ICD-10-CM | POA: Diagnosis not present

## 2017-05-23 DIAGNOSIS — Z888 Allergy status to other drugs, medicaments and biological substances status: Secondary | ICD-10-CM | POA: Diagnosis not present

## 2017-05-23 DIAGNOSIS — N3000 Acute cystitis without hematuria: Principal | ICD-10-CM | POA: Diagnosis present

## 2017-05-23 DIAGNOSIS — J189 Pneumonia, unspecified organism: Secondary | ICD-10-CM | POA: Diagnosis not present

## 2017-05-23 DIAGNOSIS — J440 Chronic obstructive pulmonary disease with acute lower respiratory infection: Secondary | ICD-10-CM | POA: Diagnosis present

## 2017-05-23 DIAGNOSIS — Z8673 Personal history of transient ischemic attack (TIA), and cerebral infarction without residual deficits: Secondary | ICD-10-CM

## 2017-05-23 DIAGNOSIS — D45 Polycythemia vera: Secondary | ICD-10-CM | POA: Diagnosis not present

## 2017-05-23 DIAGNOSIS — Z7901 Long term (current) use of anticoagulants: Secondary | ICD-10-CM

## 2017-05-23 DIAGNOSIS — R0602 Shortness of breath: Secondary | ICD-10-CM

## 2017-05-23 DIAGNOSIS — M1990 Unspecified osteoarthritis, unspecified site: Secondary | ICD-10-CM | POA: Diagnosis not present

## 2017-05-23 DIAGNOSIS — Z86718 Personal history of other venous thrombosis and embolism: Secondary | ICD-10-CM | POA: Diagnosis not present

## 2017-05-23 DIAGNOSIS — Z96643 Presence of artificial hip joint, bilateral: Secondary | ICD-10-CM | POA: Diagnosis present

## 2017-05-23 DIAGNOSIS — Z79899 Other long term (current) drug therapy: Secondary | ICD-10-CM

## 2017-05-23 DIAGNOSIS — R5383 Other fatigue: Secondary | ICD-10-CM | POA: Diagnosis not present

## 2017-05-23 DIAGNOSIS — N390 Urinary tract infection, site not specified: Secondary | ICD-10-CM | POA: Diagnosis present

## 2017-05-23 DIAGNOSIS — I10 Essential (primary) hypertension: Secondary | ICD-10-CM | POA: Diagnosis present

## 2017-05-23 DIAGNOSIS — J441 Chronic obstructive pulmonary disease with (acute) exacerbation: Secondary | ICD-10-CM | POA: Diagnosis present

## 2017-05-23 DIAGNOSIS — Z7982 Long term (current) use of aspirin: Secondary | ICD-10-CM

## 2017-05-23 DIAGNOSIS — R600 Localized edema: Secondary | ICD-10-CM | POA: Diagnosis not present

## 2017-05-23 LAB — URINALYSIS, COMPLETE (UACMP) WITH MICROSCOPIC
Bilirubin Urine: NEGATIVE
GLUCOSE, UA: NEGATIVE mg/dL
KETONES UR: NEGATIVE mg/dL
NITRITE: POSITIVE — AB
PH: 5 (ref 5.0–8.0)
Protein, ur: NEGATIVE mg/dL
Specific Gravity, Urine: 1.014 (ref 1.005–1.030)

## 2017-05-23 LAB — COMPREHENSIVE METABOLIC PANEL
ALBUMIN: 2.9 g/dL — AB (ref 3.5–5.0)
ALK PHOS: 81 U/L (ref 38–126)
ALT: 9 U/L — AB (ref 14–54)
AST: 22 U/L (ref 15–41)
Anion gap: 7 (ref 5–15)
BUN: 12 mg/dL (ref 6–20)
CALCIUM: 8.5 mg/dL — AB (ref 8.9–10.3)
CO2: 25 mmol/L (ref 22–32)
CREATININE: 0.67 mg/dL (ref 0.44–1.00)
Chloride: 105 mmol/L (ref 101–111)
GFR calc Af Amer: >60 mL/min (ref >60)
GFR calc non Af Amer: >60 mL/min (ref >60)
GLUCOSE: 118 mg/dL — AB (ref 65–99)
Potassium: 4.4 mmol/L (ref 3.5–5.1)
Sodium: 137 mmol/L (ref 135–145)
Total Bilirubin: 0.9 mg/dL (ref 0.3–1.2)
Total Protein: 7.3 g/dL (ref 6.5–8.1)

## 2017-05-23 LAB — CBC WITH DIFFERENTIAL/PLATELET
BAND NEUTROPHILS: 1 %
BASOS ABS: 0.1 10*3/uL (ref 0–0.1)
BASOS PCT: 1 %
Blasts: 0 %
EOS PCT: 1 %
Eosinophils Absolute: 0.1 10*3/uL (ref 0–0.7)
HCT: 42.5 % (ref 35.0–47.0)
Hemoglobin: 14 g/dL (ref 12.0–16.0)
LYMPHS ABS: 1.5 10*3/uL (ref 1.0–3.6)
Lymphocytes Relative: 14 %
MCH: 29.6 pg (ref 26.0–34.0)
MCHC: 32.8 g/dL (ref 32.0–36.0)
MCV: 90.2 fL (ref 80.0–100.0)
METAMYELOCYTES PCT: 1 %
MONO ABS: 0.8 10*3/uL (ref 0.2–0.9)
MYELOCYTES: 0 %
Monocytes Relative: 7 %
NRBC: 1 /100{WBCs} — AB
Neutro Abs: 8.3 10*3/uL — ABNORMAL HIGH (ref 1.4–6.5)
Neutrophils Relative %: 75 %
Other: 0 %
Platelets: 1234 10*3/uL (ref 150–440)
Promyelocytes Absolute: 0 %
RBC: 4.72 MIL/uL (ref 3.80–5.20)
RDW: 37.2 % — ABNORMAL HIGH (ref 11.5–14.5)
WBC: 10.8 10*3/uL (ref 3.6–11.0)

## 2017-05-23 LAB — PROTIME-INR
INR: 2.19
PROTHROMBIN TIME: 24.7 s — AB (ref 11.4–15.2)

## 2017-05-23 LAB — LACTIC ACID, PLASMA: Lactic Acid, Venous: 1.1 mmol/L (ref 0.5–1.9)

## 2017-05-23 MED ORDER — ONDANSETRON HCL 4 MG PO TABS: 4.0000 mg | ORAL_TABLET | Freq: Four times a day (QID) | ORAL | Status: DC | PRN

## 2017-05-23 MED ORDER — DEXTROSE 5 % IV SOLN: 1.0000 g | INTRAVENOUS | Status: DC

## 2017-05-23 MED ORDER — ASPIRIN EC 81 MG PO TBEC
81.0000 mg | DELAYED_RELEASE_TABLET | Freq: Every day | ORAL | Status: DC
  Administered 2017-05-24 – 2017-05-26 (×3): 81 mg via ORAL
  Filled 2017-05-23 (×3): qty 1

## 2017-05-23 MED ORDER — ONDANSETRON HCL 4 MG/2ML IJ SOLN: 4.0000 mg | Freq: Four times a day (QID) | INTRAMUSCULAR | Status: DC | PRN

## 2017-05-23 MED ORDER — DEXTROSE 5 % IV SOLN
2.0000 g | INTRAVENOUS | Status: DC
  Administered 2017-05-24 – 2017-05-25 (×2): 2 g via INTRAVENOUS
  Filled 2017-05-23 (×3): qty 2

## 2017-05-23 MED ORDER — LEVOFLOXACIN IN D5W 750 MG/150ML IV SOLN
750.0000 mg | Freq: Once | INTRAVENOUS | Status: AC
  Administered 2017-05-23: 750 mg via INTRAVENOUS
  Filled 2017-05-23: qty 150

## 2017-05-23 MED ORDER — AZITHROMYCIN 500 MG IV SOLR
500.0000 mg | INTRAVENOUS | Status: DC
  Administered 2017-05-24: 500 mg via INTRAVENOUS
  Filled 2017-05-23 (×2): qty 500

## 2017-05-23 MED ORDER — ACETAMINOPHEN 325 MG PO TABS
650.0000 mg | ORAL_TABLET | Freq: Four times a day (QID) | ORAL | Status: DC | PRN
  Administered 2017-05-25 – 2017-05-26 (×2): 650 mg via ORAL
  Filled 2017-05-23 (×2): qty 2

## 2017-05-23 MED ORDER — ACETAMINOPHEN 650 MG RE SUPP: 650.0000 mg | Freq: Four times a day (QID) | RECTAL | Status: DC | PRN

## 2017-05-23 MED ORDER — ACETAMINOPHEN 500 MG PO TABS
1000.0000 mg | ORAL_TABLET | Freq: Once | ORAL | Status: AC
  Administered 2017-05-23: 1000 mg via ORAL
  Filled 2017-05-23: qty 2

## 2017-05-23 MED ORDER — DEXTROSE 5 % IV SOLN
1.0000 g | Freq: Once | INTRAVENOUS | Status: AC
  Administered 2017-05-23: 1 g via INTRAVENOUS
  Filled 2017-05-23: qty 10

## 2017-05-23 MED ORDER — METOPROLOL TARTRATE 25 MG PO TABS
12.5000 mg | ORAL_TABLET | Freq: Every day | ORAL | Status: DC
  Administered 2017-05-24 – 2017-05-26 (×3): 12.5 mg via ORAL
  Filled 2017-05-23 (×3): qty 1

## 2017-05-23 MED ORDER — HYDROXYUREA 500 MG PO CAPS: 2000.0000 mg | ORAL_CAPSULE | Freq: Every day | ORAL | Status: DC

## 2017-05-23 MED ORDER — HYDROXYUREA 500 MG PO CAPS
1000.0000 mg | ORAL_CAPSULE | Freq: Every day | ORAL | Status: DC
  Administered 2017-05-24 – 2017-05-26 (×3): 1000 mg via ORAL
  Filled 2017-05-23 (×3): qty 2

## 2017-05-23 NOTE — Progress Notes (Addendum)
Pharmacy Antibiotic Note  Yolanda Wells is a 83 y.o. female admitted on 05/23/2017 with UTI.  Pharmacy has been consulted for ceftriaxone dosing.  Plan: Ceftriaxone 2 grams q 24 hours ordered.  Height: 5\' 6"  (167.6 cm) Weight: 209 lb 6.4 oz (95 kg) IBW/kg (Calculated) : 59.3  Temp (24hrs), Avg:99.4 F (37.4 C), Min:97.8 F (36.6 C), Max:101.6 F (38.7 C)   Recent Labs Lab 05/23/17 1922  WBC 10.8  CREATININE 0.67  LATICACIDVEN 1.1    Estimated Creatinine Clearance: 61.9 mL/min (by C-G formula based on SCr of 0.67 mg/dL).    Allergies  Allergen Reactions  . Sulfamethoxazole-Trimethoprim Nausea And Vomiting    Antimicrobials this admission: Ceftriaxone, Levaquin x1 7/14; azithromycin 7/15  >>    >>   Dose adjustments this admission:   Microbiology results: 7/14 BCx: pending      7/14 UA: LE(+) NO2(+) WBC TNTC 7/14 PQA:ESLPN lung atelectasis vs. infiltrate  Thank you for allowing pharmacy to be a part of this patient's care.  Deisha Stull S 05/23/2017 11:53 PM

## 2017-05-23 NOTE — ED Provider Notes (Signed)
[facility] Emergency Department Provider Note   ____________________________________________   I have reviewed the triage vital signs and the nursing notes.   HISTORY  Chief Complaint Shortness of breath  History limited by: Not Limited   HPI Yolanda Wells is a 83 y.o. female who presents to the emergency department today via EMS because of concerns for shortness of breath. The patient states that her symptoms started last night. They progressively worsened. The patient denies any associated chest pain. Denies any cough. Patient was febrile for EMS orally. Patient denies any nausea or vomiting. She was given a breathing treatment via family without any significant relief.     Past Medical History:  Diagnosis Date  . Anticoagulant long-term use   . Arthritis   . Collagen vascular disease ([place])   . Deep venous thrombosis (HCC)    right lower extremity  . Dependent edema   . History of bilateral hip replacements   . History of hysterectomy   . Hypertension   . Polycythemia vera ([place])   . Stroke [facility])    Pt reports she was told she had a stroke in may    Patient Active Problem List   Diagnosis Date Noted  . Ileus ([place]) 04/10/2017  . Influenza with respiratory manifestation 12/01/2016  . Respiratory distress 11/23/2016  . Urinary incontinence in female 09/02/2016  . Vision loss of right eye 08/26/2016  . Blindness of right eye 08/26/2016  . Inability to ambulate due to hip 10/12/2015  . Colostomy in place [facility]) 10/04/2015  . Chronic anticoagulation 10/03/2015  . Acute pulmonary embolism ([place]) 10/03/2015  . Leukocytosis 09/25/2015  . Chest pain 09/25/2015  . COPD exacerbation ([place]) 09/24/2015  . Right lower lobe pneumonia ([place]) 09/24/2015  . Chronic obstructive pulmonary disease with acute exacerbation ([place]) 09/24/2015  . Dyspnea 09/22/2015  . Elevated troponin 09/15/2015  . UTI (lower urinary tract infection) 07/16/2015  . Abdominal wall  cellulitis 07/15/2015  . DVT (deep venous thrombosis) ([place]) 07/15/2015  . HTN (hypertension) 07/15/2015  . Polycythemia vera ([place]) 07/15/2015  . Dependent edema 07/15/2015  . Arthritis 07/15/2015  . Pulmonary emboli ([place]) 05/25/2015  . Diverticulosis of colon with hemorrhage 04/02/2015  . Diverticulosis of intestine with bleeding 04/02/2015  . Dehydration, moderate 01/11/2015  . Fall 01/10/2015  . Hypertension 01/10/2015  . Osteoarthritis 01/10/2015  . Hammertoe 09/02/2012  . S/P transmetatarsal amputation of foot ([place]) 09/02/2012  . Onychomycosis 09/02/2012  . Other specified dermatoses 09/02/2012  . Pain in toe 09/02/2012  . Hip pain 08/23/2012  . S/P hip replacement 08/23/2012  . Glenohumeral arthritis 06/28/2012    Past Surgical History:  Procedure Laterality Date  . ABDOMINAL HYSTERECTOMY    . CARDIAC CATHETERIZATION Right 04/03/2015   Procedure: CENTRAL LINE INSERTION;  Surgeon: Sherri Rad, MD;  Location: [facility] ORS;  Service: General;  Laterality: Right;  . COLECTOMY WITH COLOSTOMY CREATION/HARTMANN PROCEDURE N/A 04/03/2015   Procedure: COLECTOMY WITH COLOSTOMY CREATION/HARTMANN PROCEDURE;  Surgeon: Sherri Rad, MD;  Location: [facility] ORS;  Service: General;  Laterality: N/A;  . JOINT REPLACEMENT     Left and Right Hip  . PERIPHERAL VASCULAR CATHETERIZATION N/A 04/02/2015   Procedure: Visceral Angiography;  Surgeon: Algernon Huxley, MD;  Location: [place] CV LAB;  Service: Cardiovascular;  Laterality: N/A;  . PERIPHERAL VASCULAR CATHETERIZATION N/A 04/02/2015   Procedure: Visceral Artery Intervention;  Surgeon: Algernon Huxley, MD;  Location: [place] CV LAB;  Service: Cardiovascular;  Laterality: N/A;  . PERIPHERAL VASCULAR CATHETERIZATION N/A  05/25/2015   Procedure: IVC Filter Insertion;  Surgeon: Katha Cabal, MD;  Location: [place] CV LAB;  Service: Cardiovascular;  Laterality: N/A;  . TOE AMPUTATION     right    Prior to Admission medications   Medication Sig  Start Date End Date Taking? Authorizing Provider  aspirin EC 81 MG tablet Take 1 tablet (81 mg total) by mouth daily. 09/16/15   [name], MD  docusate sodium (COLACE) 100 MG capsule Take 100 mg by mouth 2 (two) times daily.     [provider]  folic acid (FOLVITE) 657 MCG tablet Take 400 mcg by mouth daily.    [provider]  hydroxyurea (HYDREA) 500 MG capsule Take  4 tablets daily 04/21/17   Lequita Asal, MD  metoprolol tartrate (LOPRESSOR) 25 MG tablet Take 12.5 mg by mouth daily.     [provider]  Polyethylene Glycol POWD Take 17 g by mouth daily. Patient taking differently: Take 17 g by mouth every other day.  11/22/15   Clayburn Pert, MD  potassium chloride SA (K-DUR,KLOR-CON) 20 MEQ tablet Take 1 tablet (20 mEq total) by mouth 2 (two) times daily. 07/20/15   Vaughan Basta, MD  warfarin (COUMADIN) 4 MG tablet Take 4 mg by mouth as directed. Tuesday, Thursday, Saturday and Sunday     [provider]  warfarin (COUMADIN) 5 MG tablet Take 1 tablet (5 mg total) by mouth daily. Patient taking differently: Take 5 mg by mouth 3 (three) times a week. Monday, Wednesday, Friday 03/13/17   Lequita Asal, MD    Allergies Sulfamethoxazole-trimethoprim  Family History  Problem Relation Age of Onset  . Heart attack Father   . COPD Father   . Hypertension Unknown   . Arthritis-Osteo Unknown   . Diabetes Brother   . Osteosarcoma Son     Social History Social History  Substance Use Topics  . Smoking status: Never Smoker  . Smokeless tobacco: Never Used  . Alcohol use No    Review of Systems Constitutional: No fever/chills Eyes: No visual changes. ENT: No sore throat. Cardiovascular: Denies chest pain. Respiratory: Positive for shortness of breath. Gastrointestinal: No abdominal pain.  No nausea, no vomiting.  No diarrhea.   Genitourinary: Negative for dysuria. Musculoskeletal: Negative for back pain. Skin: Negative  for rash. Neurological: Negative for headaches, focal weakness or numbness.  ____________________________________________   PHYSICAL EXAM:  VITAL SIGNS: ED Triage Vitals  Enc Vitals Group     BP 05/23/17 1914 (!) 159/93     Pulse Rate 05/23/17 1914 86     Resp 05/23/17 1914 (!) 22     Temp 05/23/17 1914 99.6 F (37.6 C)     Temp Source 05/23/17 1914 Oral     SpO2 05/23/17 1914 96 %     Weight 05/23/17 1915 214 lb (97.1 kg)     Height 05/23/17 1915 5\' 6"  (1.676 m)     Head Circumference --      Peak Flow --      Pain Score 05/23/17 1913 0   Constitutional: Alert and oriented. Well appearing and in no distress. Eyes: Conjunctivae are normal.  ENT   Head: Normocephalic and atraumatic.   Nose: No congestion/rhinnorhea.   Mouth/Throat: Mucous membranes are moist.   Neck: No stridor. Hematological/Lymphatic/Immunilogical: No cervical lymphadenopathy. Cardiovascular: Normal rate, regular rhythm.  No murmurs, rubs, or gallops. Respiratory: Normal respiratory effort without tachypnea nor retractions. Breath sounds are clear and equal bilaterally. No wheezes/rales/rhonchi. Gastrointestinal:  Soft and non tender. No rebound. No guarding.  Genitourinary: Deferred Musculoskeletal: Normal range of motion in all extremities. S/p right midfoot amputation.  Neurologic:  Normal speech and language. No gross focal neurologic deficits are appreciated.  Skin:  Skin is warm, dry and intact. No rash noted. Psychiatric: Mood and affect are normal. Speech and behavior are normal. Patient exhibits appropriate insight and judgment.  ____________________________________________    LABS (pertinent positives/negatives)  Labs Reviewed  COMPREHENSIVE METABOLIC PANEL - Abnormal; Notable for the following:       Result Value   Glucose, Bld 118 (*)    Calcium 8.5 (*)    Albumin 2.9 (*)    ALT 9 (*)    All other components within normal limits  CBC WITH DIFFERENTIAL/PLATELET - Abnormal;  Notable for the following:    RDW 37.2 (*)    Platelets 1,234 (*)    nRBC 1 (*)    Neutro Abs 8.3 (*)    All other components within normal limits  URINALYSIS, COMPLETE (UACMP) WITH MICROSCOPIC - Abnormal; Notable for the following:    Color, Urine YELLOW (*)    APPearance CLOUDY (*)    Hgb urine dipstick SMALL (*)    Nitrite POSITIVE (*)    Leukocytes, UA LARGE (*)    Bacteria, UA MANY (*)    Squamous Epithelial / LPF 0-5 (*)    All other components within normal limits  PROTIME-INR - Abnormal; Notable for the following:    Prothrombin Time 24.7 (*)    All other components within normal limits  CULTURE, BLOOD (ROUTINE X 2)  CULTURE, BLOOD (ROUTINE X 2)  LACTIC ACID, PLASMA     ____________________________________________   EKG  I, Nance Pear, attending physician, personally viewed and interpreted this EKG  EKG Time: 1921 Rate: 86 Rhythm: sinus rhythm Axis: left axis deviation Intervals: qtc 448 QRS: RBBB ST changes: no st elevation Impression: abnormal ekg   ____________________________________________    RADIOLOGY  CXR IMPRESSION:  1. Posterior lower lung atelectasis or infiltrate  2. Stable enlarged cardiomediastinal silhouette. Bulbous enlargement  of right pulmonary artery similar compared to prior.    ____________________________________________   PROCEDURES  Procedures  ____________________________________________   INITIAL IMPRESSION / ASSESSMENT AND PLAN / ED COURSE  Pertinent labs & imaging results that were available during my care of the patient were reviewed by me and considered in my medical decision making (see chart for details).  Patient presented to the emergency department today because of concerns for shortness of breath. Patient's workup was notable for urinalysis consistent with urinary tract infection. Certainly think this could explain some of the patient's symptoms. Chest x-ray showed a possible infiltrative process  atelectasis. Will plan on treating with antibiotics. Will plan on admission to the hospital service.  ____________________________________________   FINAL CLINICAL IMPRESSION(S) / ED DIAGNOSES  Final diagnoses:  Acute lower UTI  Shortness of breath     Note: This dictation was prepared with Dragon dictation. Any transcriptional errors that result from this process are unintentional     Nance Pear, MD 05/23/17 [phone]

## 2017-05-23 NOTE — ED Triage Notes (Signed)
Per [facility], pt in for c/o [facility]. Pt saturation 95% RA, BP 130/80, and CBG WDL per EMS. Pt granddaughter reported that she administered breathing treatment at home. Pt has hx of COPD. Pt appears in NAD at this time.

## 2017-05-23 NOTE — ED Notes (Signed)
Lab called and informed this RN that blue top was unable to be rum at this point. New blood work obtained and sent to lab for analysis.

## 2017-05-23 NOTE — ED Notes (Signed)
Pt transport to 150

## 2017-05-23 NOTE — H&P (Signed)
[facility] NAME: Yolanda Wells    MR#:  [id number]  DATE OF BIRTH:  January 01, 1932  DATE OF ADMISSION:  05/23/2017  PRIMARY CARE PHYSICIAN: [name], MD   REQUESTING/REFERRING PHYSICIAN: Archie Balboa, M.D.  CHIEF COMPLAINT:   Chief Complaint  Patient presents with  . Blood Infection    HISTORY OF PRESENT ILLNESS:  Yolanda Wells  is a 83 y.o. female who presents with Shortness of breath. Patient states that this has been going on for the last couple of days. Here in the ED she was found to have pneumonia as well as UTI. Of note, patient has a history of polycythemia vera and is on Hydrea. Due to the same her white blood cell count is typically low, is within normal range today, that this may be elevated for her. She also has an elevated platelet level around 1200, baseline seems to be usually between about 800 and 1000.  PAST MEDICAL HISTORY:   Past Medical History:  Diagnosis Date  . Anticoagulant long-term use   . Arthritis   . Collagen vascular disease ([place])   . Deep venous thrombosis (HCC)    right lower extremity  . Dependent edema   . History of bilateral hip replacements   . History of hysterectomy   . Hypertension   . Polycythemia vera ([place])   . Stroke [facility])    Pt reports she was told she had a stroke in may    PAST SURGICAL HISTORY:   Past Surgical History:  Procedure Laterality Date  . ABDOMINAL HYSTERECTOMY    . CARDIAC CATHETERIZATION Right 04/03/2015   Procedure: CENTRAL LINE INSERTION;  Surgeon: Sherri Rad, MD;  Location: [facility] ORS;  Service: General;  Laterality: Right;  . COLECTOMY WITH COLOSTOMY CREATION/HARTMANN PROCEDURE N/A 04/03/2015   Procedure: COLECTOMY WITH COLOSTOMY CREATION/HARTMANN PROCEDURE;  Surgeon: Sherri Rad, MD;  Location: [facility] ORS;  Service: General;  Laterality: N/A;  . JOINT REPLACEMENT     Left and Right Hip  . PERIPHERAL VASCULAR CATHETERIZATION N/A 04/02/2015   Procedure:  Visceral Angiography;  Surgeon: Algernon Huxley, MD;  Location: [place] CV LAB;  Service: Cardiovascular;  Laterality: N/A;  . PERIPHERAL VASCULAR CATHETERIZATION N/A 04/02/2015   Procedure: Visceral Artery Intervention;  Surgeon: Algernon Huxley, MD;  Location: [place] CV LAB;  Service: Cardiovascular;  Laterality: N/A;  . PERIPHERAL VASCULAR CATHETERIZATION N/A 05/25/2015   Procedure: IVC Filter Insertion;  Surgeon: Katha Cabal, MD;  Location: [place] CV LAB;  Service: Cardiovascular;  Laterality: N/A;  . TOE AMPUTATION     right    SOCIAL HISTORY:   Social History  Substance Use Topics  . Smoking status: Never Smoker  . Smokeless tobacco: Never Used  . Alcohol use No    FAMILY HISTORY:   Family History  Problem Relation Age of Onset  . Heart attack Father   . COPD Father   . Hypertension Unknown   . Arthritis-Osteo Unknown   . Diabetes Brother   . Osteosarcoma Son     DRUG ALLERGIES:   Allergies  Allergen Reactions  . Sulfamethoxazole-Trimethoprim Nausea And Vomiting    MEDICATIONS AT HOME:   Prior to Admission medications   Medication Sig Start Date End Date Taking? Authorizing Provider  aspirin EC 81 MG tablet Take 1 tablet (81 mg total) by mouth daily. 09/16/15   [name], MD  docusate sodium (COLACE) 100 MG capsule Take 100 mg by mouth 2 (two)  times daily.     [provider]  folic acid (FOLVITE) 735 MCG tablet Take 400 mcg by mouth daily.    [provider]  hydroxyurea (HYDREA) 500 MG capsule Take  4 tablets daily 04/21/17   Lequita Asal, MD  metoprolol tartrate (LOPRESSOR) 25 MG tablet Take 12.5 mg by mouth daily.     [provider]  Polyethylene Glycol POWD Take 17 g by mouth daily. Patient taking differently: Take 17 g by mouth every other day.  11/22/15   Clayburn Pert, MD  potassium chloride SA (K-DUR,KLOR-CON) 20 MEQ tablet Take 1 tablet (20 mEq total) by mouth 2 (two) times daily. 07/20/15    Vaughan Basta, MD  warfarin (COUMADIN) 4 MG tablet Take 4 mg by mouth as directed. Tuesday, Thursday, Saturday and Sunday     [provider]  warfarin (COUMADIN) 5 MG tablet Take 1 tablet (5 mg total) by mouth daily. Patient taking differently: Take 5 mg by mouth 3 (three) times a week. Monday, Wednesday, Friday 03/13/17   Lequita Asal, MD    REVIEW OF SYSTEMS:  Review of Systems  Constitutional: Negative for chills, fever, malaise/fatigue and weight loss.  HENT: Negative for ear pain, hearing loss and tinnitus.   Eyes: Negative for blurred vision, double vision, pain and redness.  Respiratory: Positive for shortness of breath. Negative for cough and hemoptysis.   Cardiovascular: Negative for chest pain, palpitations, orthopnea and leg swelling.  Gastrointestinal: Negative for abdominal pain, constipation, diarrhea, nausea and vomiting.  Genitourinary: Positive for dysuria. Negative for frequency and hematuria.  Musculoskeletal: Negative for back pain, joint pain and neck pain.  Skin:       No acne, rash, or lesions  Neurological: Negative for dizziness, tremors, focal weakness and weakness.  Endo/Heme/Allergies: Negative for polydipsia. Does not bruise/bleed easily.  Psychiatric/Behavioral: Negative for depression. The patient is not nervous/anxious and does not have insomnia.      VITAL SIGNS:   Vitals:   05/23/17 1914 05/23/17 1915 05/23/17 1945  BP: (!) 159/93    Pulse: 86    Resp: (!) 22    Temp: 99.6 F (37.6 C)  (!) 101.6 F (38.7 C)  TempSrc: Oral  Rectal  SpO2: 96%    Weight:  97.1 kg (214 lb)   Height:  5\' 6"  (1.676 m)    Wt Readings from Last 3 Encounters:  05/23/17 97.1 kg (214 lb)  05/08/17 97.1 kg (214 lb)  04/12/17 97.1 kg (214 lb)    PHYSICAL EXAMINATION:  Physical Exam  Vitals reviewed. Constitutional: She is oriented to person, place, and time. She appears well-developed and well-nourished. No distress.  HENT:  Head:  Normocephalic and atraumatic.  Mouth/Throat: Oropharynx is clear and moist.  Eyes: Pupils are equal, round, and reactive to light. Conjunctivae and EOM are normal. No scleral icterus.  Neck: Normal range of motion. Neck supple. No JVD present. No thyromegaly present.  Cardiovascular: Normal rate, regular rhythm and intact distal pulses.  Exam reveals no gallop and no friction rub.   No murmur heard. Respiratory: Effort normal. No respiratory distress. She has no wheezes. She has rales.  GI: Soft. Bowel sounds are normal. She exhibits no distension. There is no tenderness.  Musculoskeletal: Normal range of motion. She exhibits no edema.  No arthritis, no gout  Lymphadenopathy:    She has no cervical adenopathy.  Neurological: She is alert and oriented to person, place, and time. No cranial nerve deficit.  No dysarthria, no aphasia  Skin: Skin is warm and dry. No rash noted. No erythema.  Psychiatric: She has a normal mood and affect. Her behavior is normal. Judgment and thought content normal.    LABORATORY PANEL:   CBC  Recent Labs Lab 05/23/17 1922  WBC 10.8  HGB 14.0  HCT 42.5  PLT 1,234*   ------------------------------------------------------------------------------------------------------------------  Chemistries   Recent Labs Lab 05/23/17 1922  NA 137  K 4.4  CL 105  CO2 25  GLUCOSE 118*  BUN 12  CREATININE 0.67  CALCIUM 8.5*  AST 22  ALT 9*  ALKPHOS 81  BILITOT 0.9   ------------------------------------------------------------------------------------------------------------------  Cardiac Enzymes No results for input(s): TROPONINI in the last 168 hours. ------------------------------------------------------------------------------------------------------------------  RADIOLOGY:  Dg Chest 2 View  Result Date: 05/23/2017 CLINICAL DATA:  Shortness of breath EXAM: CHEST  2 VIEW COMPARISON:  11/24/2016, 11/23/2016, 11/22/2016 FINDINGS: Slight elevation of  the right diaphragm with mild basilar atelectasis. Posterior infiltrate or atelectasis. No pleural effusion. Stable mild cardiomegaly with aortic atherosclerosis. No pneumothorax. Advanced arthritis of the shoulders. Degenerative osteophytes of the spine. Stable enlarged right hilar structures. IMPRESSION: 1. Posterior lower lung atelectasis or infiltrate 2. Stable enlarged cardiomediastinal silhouette. Bulbous enlargement of right pulmonary artery similar compared to prior. Electronically Signed   By: Donavan Foil M.D.   On: 05/23/2017 20:27    EKG:   Orders placed or performed during the hospital encounter of 04/09/17  . ED EKG  . ED EKG    IMPRESSION AND PLAN:  Principal Problem:   UTI (urinary tract infection) - IV antibiotics, urine cultures sent Active Problems:   COPD ([place]) - continue home dose inhalers   CAP (community acquired pneumonia) - IV antibiotics, supportive meds when necessary   HTN (hypertension) - continue home meds   Polycythemia vera ([place]) - continue home dose Hydrea, hematology consult   Chronic anticoagulation - patient has had significant DVTs, likely related to her polycythemia vera, continue home dose warfarin  All the records are reviewed and case discussed with ED provider. Management plans discussed with the patient and/or family.  DVT PROPHYLAXIS: Systemic anticoagulation  GI PROPHYLAXIS: None  ADMISSION STATUS: Inpatient  CODE STATUS: Full Code Status History    Date Active Date Inactive Code Status Order ID Comments User Context   04/10/2017  3:25 AM 04/11/2017  7:16 PM Full Code [id number]  Harrie Foreman, MD Inpatient   11/23/2016  6:29 AM 12/01/2016  7:35 PM Full Code [id number]  Harrie Foreman, MD Inpatient   08/26/2016  4:45 AM 08/26/2016  7:23 PM Full Code [id number]  Saundra Shelling, MD Inpatient   09/22/2015  5:55 PM 09/25/2015  6:44 PM Full Code [id number]  Demetrios Loll, MD ED   09/15/2015  3:41 AM 09/17/2015  5:41 PM Full Code [id number]   Harrie Foreman, MD Inpatient   07/16/2015  1:51 AM 07/20/2015 11:34 PM Full Code [id number]  Lance Coon, MD Inpatient   05/25/2015  8:34 AM 05/29/2015  4:16 PM Full Code [id number]  Harrie Foreman, MD Inpatient   04/02/2015  6:06 AM 04/08/2015  4:51 PM Full Code [id number]  Harrie Foreman, MD Inpatient      TOTAL TIME TAKING CARE OF THIS PATIENT: 45 minutes.   Chanelle Hodsdon [place] 05/23/2017, 9:43 PM  [facility]  Office  [phone]  CC: Primary care physician; [name], MD  Note:  This document was prepared using Dragon voice recognition software and may include unintentional dictation errors.

## 2017-05-24 LAB — CBC
HCT: 39.1 % (ref 35.0–47.0)
HEMOGLOBIN: 13.1 g/dL (ref 12.0–16.0)
MCH: 30.5 pg (ref 26.0–34.0)
MCHC: 33.6 g/dL (ref 32.0–36.0)
MCV: 90.8 fL (ref 80.0–100.0)
Platelets: 1110 10*3/uL (ref 150–440)
RBC: 4.31 MIL/uL (ref 3.80–5.20)
RDW: 33.2 % — ABNORMAL HIGH (ref 11.5–14.5)
WBC: 9.4 10*3/uL (ref 3.6–11.0)

## 2017-05-24 LAB — BASIC METABOLIC PANEL
ANION GAP: 4 — AB (ref 5–15)
BUN: 11 mg/dL (ref 6–20)
CHLORIDE: 109 mmol/L (ref 101–111)
CO2: 25 mmol/L (ref 22–32)
Calcium: 8.3 mg/dL — ABNORMAL LOW (ref 8.9–10.3)
Creatinine, Ser: 0.55 mg/dL (ref 0.44–1.00)
GFR calc non Af Amer: >60 mL/min (ref >60)
Glucose, Bld: 104 mg/dL — ABNORMAL HIGH (ref 65–99)
Potassium: 4 mmol/L (ref 3.5–5.1)
Sodium: 138 mmol/L (ref 135–145)

## 2017-05-24 MED ORDER — DOCUSATE SODIUM 100 MG PO CAPS
100.0000 mg | ORAL_CAPSULE | Freq: Two times a day (BID) | ORAL | Status: DC
  Administered 2017-05-24 – 2017-05-26 (×4): 100 mg via ORAL
  Filled 2017-05-24 (×4): qty 1

## 2017-05-24 MED ORDER — WARFARIN SODIUM 5 MG PO TABS
5.0000 mg | ORAL_TABLET | ORAL | Status: DC
  Administered 2017-05-25: 5 mg via ORAL
  Filled 2017-05-24: qty 1

## 2017-05-24 MED ORDER — WARFARIN SODIUM 4 MG PO TABS
4.0000 mg | ORAL_TABLET | ORAL | Status: DC
  Administered 2017-05-24: 4 mg via ORAL
  Filled 2017-05-24 (×2): qty 1

## 2017-05-24 NOTE — Care Management Note (Signed)
Case Management Note  Patient Details  Name: Yolanda Wells MRN: [id number] Date of Birth: 11/12/1931  Subjective/Objective:     Discussed discharge planning briefly with granddaughter [name]. [name] resides with [name] who is also her primary caretaker. [name] has all needed DME equipment at home, and is currently open to [facility] for nursing for routine INR blood draws. However, granddaughter reports that she anticipates that [name] will be discharged to Rehab. The weekend SWer was updated. Case management will also follow for discharge planning.               Action/Plan:   Expected Discharge Date:                  Expected Discharge Plan:     In-House Referral:     Discharge planning Services     Post Acute Care Choice:    Choice offered to:     DME Arranged:    DME Agency:     HH Arranged:    HH Agency:     Status of Service:     If discussed at [employer] of Stay Meetings, dates discussed:    Additional Comments:  Haygen Zebrowski A, RN 05/24/2017, 2:30 PM

## 2017-05-24 NOTE — Progress Notes (Signed)
ANTICOAGULATION CONSULT NOTE - Initial Consult  Pharmacy Consult for warfarin dosing Indication: VTE prophylaxis  Allergies  Allergen Reactions  . Sulfamethoxazole-Trimethoprim Nausea And Vomiting    Patient Measurements: Height: 5\' 6"  (167.6 cm) Weight: 209 lb 6.4 oz (95 kg) IBW/kg (Calculated) : 59.3 Heparin Dosing Weight: n/a  Vital Signs: Temp: 97.8 F (36.6 C) (07/14 2310) Temp Source: Oral (07/14 2310) BP: 108/62 (07/14 2310) Pulse Rate: 78 (07/14 2310)  Labs:  Recent Labs  05/23/17 1922 05/23/17 2133  HGB 14.0  --   HCT 42.5  --   PLT 1,234*  --   LABPROT  --  24.7*  INR  --  2.19  CREATININE 0.67  --     Estimated Creatinine Clearance: 61.9 mL/min (by C-G formula based on SCr of 0.67 mg/dL).   Medical History: Past Medical History:  Diagnosis Date  . Anticoagulant long-term use   . Arthritis   . Collagen vascular disease ([place])   . Deep venous thrombosis (HCC)    right lower extremity  . Dependent edema   . History of bilateral hip replacements   . History of hysterectomy   . Hypertension   . Polycythemia vera ([place])   . Stroke [facility])    Pt reports she was told she had a stroke in may    Medications:  Home dose 5 mg M, W, F; 4 mg T, Th, Sa, Su.  Assessment: INR therapeutic on admission  Goal of Therapy:  INR 2-3    Plan:  Resume home regimen as above. Daily INR while on antibiotics.  Krystal Delduca S 05/24/2017,12:04 AM

## 2017-05-24 NOTE — Progress Notes (Addendum)
New Admission Note:   Arrival Method: per stretcher from ED, pt came from home Mental Orientation: alert and oriented X4 with intermittent forgetfulness, can be easily redirected Telemetry: none ordered Assessment: Completed Skin: warm, dry, with +2 pitting edema noted on bilateral lower extremities, all toes amputated on the right foot, with colostomy in place on LLQ, with excoriation noted on the buttocks. Prophylactic sacral foam applied. IV: G20 on the right AC, G22 on the right hand, with tranparent dressing, intact. Pain: denies having any pain as of this time Safety Measures: Safety Fall Prevention Plan has been given and discussed Admission: Completed 1A Orientation: Patient has been oriented to the room, unit and staff.  Family: granddaughter [name] at bedside  Orders have been reviewed and implemented. Will continue to monitor patient. Call light within reach and bed alarm has been activated.   Georgeanna Harrison, RN [facility] 1A

## 2017-05-24 NOTE — Progress Notes (Signed)
[facility] [place] NAME: Yolanda Wells    MR#:  [id number]  DATE OF BIRTH:  1932/06/21  SUBJECTIVE:  CHIEF COMPLAINT:   Chief Complaint  Patient presents with  . Blood Infection   -Still feels short of breath. Denies any chest pain. Noted to have a fever last night. Admitted for UTI and pneumonia  REVIEW OF SYSTEMS:  Review of Systems  Constitutional: Positive for malaise/fatigue. Negative for chills, fever and weight loss.  HENT: Negative for ear discharge, ear pain, hearing loss and nosebleeds.   Eyes: Negative for blurred vision, double vision and photophobia.  Respiratory: Positive for shortness of breath. Negative for cough, hemoptysis and wheezing.   Cardiovascular: Negative for chest pain, palpitations, orthopnea and leg swelling.  Gastrointestinal: Negative for abdominal pain, constipation, diarrhea, heartburn, melena, nausea and vomiting.  Genitourinary: Positive for dysuria. Negative for frequency, hematuria and urgency.  Musculoskeletal: Positive for myalgias. Negative for back pain and neck pain.  Skin: Negative for rash.  Neurological: Negative for dizziness, tremors, sensory change, speech change, focal weakness and headaches.  Endo/Heme/Allergies: Does not bruise/bleed easily.  Psychiatric/Behavioral: Negative for depression.    DRUG ALLERGIES:   Allergies  Allergen Reactions  . Sulfamethoxazole-Trimethoprim Nausea And Vomiting    VITALS:  Blood pressure 108/62, pulse 78, temperature 97.8 F (36.6 C), temperature source Oral, resp. rate 18, height 5\' 6"  (1.676 m), weight 95 kg (209 lb 6.4 oz), SpO2 95 %.  PHYSICAL EXAMINATION:  Physical Exam  GENERAL:  83 y.o.-year-old patient lying in the bed with no acute distress.  EYES: Pupils equal, round, reactive to light and accommodation. No scleral icterus. Extraocular muscles intact.  HEENT: Head atraumatic, normocephalic. Oropharynx and nasopharynx clear.  NECK:   Supple, no jugular venous distention. No thyroid enlargement, no tenderness.  LUNGS: Normal breath sounds bilaterally, no wheezing, rhonchi or crepitation. No use of accessory muscles of respiration. Fine bibasilar crackles heard CARDIOVASCULAR: S1, S2 normal. No rubs, or gallops. Loud 2/6 systolic murmur is present ABDOMEN: Soft, nontender, nondistended. Bowel sounds present. No organomegaly or mass.  EXTREMITIES: No pedal edema, cyanosis, or clubbing. Right foot status post forefoot amputation NEUROLOGIC: Cranial nerves II through XII are intact. Muscle strength 5/5 in all extremities. Sensation intact. Gait not checked.  PSYCHIATRIC: The patient is alert and oriented x 3.  SKIN: No obvious rash, lesion, or ulcer.    LABORATORY PANEL:   CBC  Recent Labs Lab 05/24/17 0343  WBC 9.4  HGB 13.1  HCT 39.1  PLT 1,110*   ------------------------------------------------------------------------------------------------------------------  Chemistries   Recent Labs Lab 05/23/17 1922 05/24/17 0343  NA 137 138  K 4.4 4.0  CL 105 109  CO2 25 25  GLUCOSE 118* 104*  BUN 12 11  CREATININE 0.67 0.55  CALCIUM 8.5* 8.3*  AST 22  --   ALT 9*  --   ALKPHOS 81  --   BILITOT 0.9  --    ------------------------------------------------------------------------------------------------------------------  Cardiac Enzymes No results for input(s): TROPONINI in the last 168 hours. ------------------------------------------------------------------------------------------------------------------  RADIOLOGY:  Dg Chest 2 View  Result Date: 05/23/2017 CLINICAL DATA:  Shortness of breath EXAM: CHEST  2 VIEW COMPARISON:  11/24/2016, 11/23/2016, 11/22/2016 FINDINGS: Slight elevation of the right diaphragm with mild basilar atelectasis. Posterior infiltrate or atelectasis. No pleural effusion. Stable mild cardiomegaly with aortic atherosclerosis. No pneumothorax. Advanced arthritis of the shoulders.  Degenerative osteophytes of the spine. Stable enlarged right hilar structures. IMPRESSION: 1. Posterior lower lung atelectasis or  infiltrate 2. Stable enlarged cardiomediastinal silhouette. Bulbous enlargement of right pulmonary artery similar compared to prior. Electronically Signed   By: Donavan Foil M.D.   On: 05/23/2017 20:27    EKG:   Orders placed or performed during the hospital encounter of 04/09/17  . ED EKG  . ED EKG    ASSESSMENT AND PLAN:   83 year old female with past medical history significant for history of DVT on warfarin, arthritis, polycythemia vera, hypertension and stroke presents to hospital secondary to worsening shortness of breath.  #1 acute cystitis-significantly infected urine, urine cultures have's been sent for. -On Rocephin. Follow-up blood cultures as well.  #2 right lower lobe pneumonia--on Rocephin and azithromycin. -Encouraged to do incentive spirometry.  #3 thrombocytosis with underlying polycythemia vera-oncology consulted. Platelet count greater than 1100 K -Continue hydroxyurea. Might need phlebotomy  #4 history of DVT-on warfarin.     All the records are reviewed and case discussed with Care Management/Social Workerr. Management plans discussed with the patient, family and they are in agreement.  CODE STATUS: Full code  TOTAL TIME TAKING CARE OF THIS PATIENT: 37 minutes.   POSSIBLE D/C IN 1-2 DAYS, DEPENDING ON CLINICAL CONDITION.   [name] M.D on 05/24/2017 at 9:30 AM  Between 7am to 6pm - Pager - [phone]  After 6pm go to [email or web address] - password [email or web address] [facility]  Office  [phone]  CC: Primary care physician; [name], MD

## 2017-05-24 NOTE — Progress Notes (Signed)
Pt claimed she is taking "a white pill and a red pill" every night. Pt's MAR in epic reviewed by this writer, no due bedtime medicines ordered. Writer called pt's grand daughter [name] who is the primary caregiver to confirm the information. Grand daughter said pt is taking Colace twice a day and Potassium tablet 106meq twice a day. Dr. Jannifer Franklin paged and informed, he placed order for Colace, however Potassium tablet needs to be held tonight per MD. Pt's K level today is 4.0. Pt made aware and agreed to the plan. [place] daughter made aware over the phone. Will administer Colace PO as ordered.

## 2017-05-24 NOTE — Clinical Social Work Note (Signed)
CSW contacted attending MD to discuss possible SNF needs for IV ABX. The patient may not require such at discharge. CSW is following pending IV ABX needs at discharge.  Santiago Bumpers, MSW, Latanya Presser [phone]

## 2017-05-25 DIAGNOSIS — J189 Pneumonia, unspecified organism: Secondary | ICD-10-CM

## 2017-05-25 DIAGNOSIS — R600 Localized edema: Secondary | ICD-10-CM

## 2017-05-25 DIAGNOSIS — Z792 Long term (current) use of antibiotics: Secondary | ICD-10-CM

## 2017-05-25 DIAGNOSIS — Z8673 Personal history of transient ischemic attack (TIA), and cerebral infarction without residual deficits: Secondary | ICD-10-CM

## 2017-05-25 DIAGNOSIS — N390 Urinary tract infection, site not specified: Secondary | ICD-10-CM

## 2017-05-25 DIAGNOSIS — Z86718 Personal history of other venous thrombosis and embolism: Secondary | ICD-10-CM

## 2017-05-25 DIAGNOSIS — Z79899 Other long term (current) drug therapy: Secondary | ICD-10-CM

## 2017-05-25 DIAGNOSIS — M1990 Unspecified osteoarthritis, unspecified site: Secondary | ICD-10-CM

## 2017-05-25 DIAGNOSIS — D45 Polycythemia vera: Secondary | ICD-10-CM

## 2017-05-25 DIAGNOSIS — Z7901 Long term (current) use of anticoagulants: Secondary | ICD-10-CM

## 2017-05-25 DIAGNOSIS — Z7982 Long term (current) use of aspirin: Secondary | ICD-10-CM

## 2017-05-25 DIAGNOSIS — R5383 Other fatigue: Secondary | ICD-10-CM

## 2017-05-25 DIAGNOSIS — I10 Essential (primary) hypertension: Secondary | ICD-10-CM

## 2017-05-25 LAB — PROTIME-INR
INR: 2.3
PROTHROMBIN TIME: 25.7 s — AB (ref 11.4–15.2)

## 2017-05-25 LAB — CBC
HCT: 39.5 % (ref 35.0–47.0)
Hemoglobin: 12.9 g/dL (ref 12.0–16.0)
MCH: 29.3 pg (ref 26.0–34.0)
MCHC: 32.7 g/dL (ref 32.0–36.0)
MCV: 89.6 fL (ref 80.0–100.0)
PLATELETS: 1155 10*3/uL — AB (ref 150–440)
RBC: 4.41 MIL/uL (ref 3.80–5.20)
RDW: 33.5 % — ABNORMAL HIGH (ref 11.5–14.5)
WBC: 10.8 10*3/uL (ref 3.6–11.0)

## 2017-05-25 MED ORDER — SODIUM CHLORIDE 0.9% FLUSH
3.0000 mL | Freq: Two times a day (BID) | INTRAVENOUS | Status: DC
  Administered 2017-05-25 – 2017-05-26 (×2): 3 mL via INTRAVENOUS

## 2017-05-25 MED ORDER — AZITHROMYCIN 500 MG PO TABS
500.0000 mg | ORAL_TABLET | ORAL | Status: DC
  Administered 2017-05-25: 500 mg via ORAL
  Filled 2017-05-25: qty 1

## 2017-05-25 NOTE — Progress Notes (Signed)
Patient is A&O x 4. Up with assist x 1-2 pivot transfer, which is parent's baseline. Takes meds whole. NSL in RFA. Up to chair most of the afternoon. Uses call light approp. Bed/chair alarm on for safety. Incont of urine. LBM 7/15.

## 2017-05-25 NOTE — Care Management (Signed)
Open to [facility] for SN and PT.

## 2017-05-25 NOTE — Progress Notes (Signed)
ANTIBIOTIC CONSULT NOTE  Pharmacy Consult for ceftriaxone dosing Indication: bacteremia and UTI  Allergies  Allergen Reactions  . Sulfamethoxazole-Trimethoprim Nausea And Vomiting    Patient Measurements: Height: 5\' 6"  (167.6 cm) Weight: 209 lb 6.4 oz (95 kg) IBW/kg (Calculated) : 59.3  Vital Signs: Temp: 99.4 F (37.4 C) (07/15 2311) Temp Source: Oral (07/15 2311) BP: 134/75 (07/15 2311) Pulse Rate: 94 (07/15 2311) Intake/Output from previous day: 07/15 0701 - 07/16 0700 In: 780 [P.O.:480; IV Piggyback:300] Out: 100 [Stool:100] Intake/Output from this shift: No intake/output data recorded.  Labs:  Recent Labs  05/23/17 1922 05/24/17 0343 05/25/17 0423  WBC 10.8 9.4 10.8  HGB 14.0 13.1 12.9  PLT 1,234* 1,110* 1,155*  CREATININE 0.67 0.55  --    Estimated Creatinine Clearance: 61.9 mL/min (by C-G formula based on SCr of 0.55 mg/dL). No results for input(s): VANCOTROUGH, VANCOPEAK, VANCORANDOM, GENTTROUGH, GENTPEAK, GENTRANDOM, TOBRATROUGH, TOBRAPEAK, TOBRARND, AMIKACINPEAK, AMIKACINTROU, AMIKACIN in the last 72 hours.   Microbiology: Recent Results (from the past 720 hour(s))  Culture, blood (Routine x 2)     Status: None (Preliminary result)   Collection Time: 05/23/17  7:23 PM  Result Value Ref Range Status   Specimen Description BLOOD RIGHT ANTECUBITAL  Final   Special Requests   Final    BOTTLES DRAWN AEROBIC AND ANAEROBIC Blood Culture adequate volume   Culture NO GROWTH < 12 HOURS  Final   Report Status PENDING  Incomplete  Culture, blood (Routine x 2)     Status: None (Preliminary result)   Collection Time: 05/23/17  7:37 PM  Result Value Ref Range Status   Specimen Description BLOOD RIGHT HAND  Final   Special Requests   Final    BOTTLES DRAWN AEROBIC AND ANAEROBIC Blood Culture adequate volume   Culture NO GROWTH < 12 HOURS  Final   Report Status PENDING  Incomplete    Medical History: Past Medical History:  Diagnosis Date  . Anticoagulant  long-term use   . Arthritis   . Collagen vascular disease ([place])   . Deep venous thrombosis (HCC)    right lower extremity  . Dependent edema   . History of bilateral hip replacements   . History of hysterectomy   . Hypertension   . Polycythemia vera ([place])   . Stroke [facility])    Pt reports she was told she had a stroke in may    Medications:  Scheduled:  . aspirin EC  81 mg Oral Daily  . docusate sodium  100 mg Oral BID  . hydroxyurea  1,000 mg Oral Daily  . metoprolol tartrate  12.5 mg Oral Daily  . warfarin  4 mg Oral Once per day on Sun Tue Thu Sat  . warfarin  5 mg Oral Once per day on Mon Wed Fri   Infusions:  . azithromycin 500 mg (05/24/17 1759)  . cefTRIAXone (ROCEPHIN) IVPB 2 gram/50 mL D5W ([place]) Stopped (05/24/17 1739)   PRN: acetaminophen **OR** acetaminophen, ondansetron **OR** ondansetron (ZOFRAN) IV Assessment and plan: 83 y/o Female presents with worsening SOB, pneumonia suspected and UTI. Azithromycin and ceftriaxone ordered for UTI and PNA. Ceftriaxone 2gm iv q24h   Expected duration 7 days with resolution of temperature and/or normalization of WBC Follow up culture results  Yolanda Wells 05/25/2017,7:24 AM

## 2017-05-25 NOTE — Progress Notes (Signed)
Yolanda Wells for warfarin dosing Indication: VTE prophylaxis  Allergies  Allergen Reactions  . Sulfamethoxazole-Trimethoprim Nausea And Vomiting    Patient Measurements: Height: 5\' 6"  (167.6 cm) Weight: 209 lb 6.4 oz (95 kg) IBW/kg (Calculated) : 59.3 Heparin Dosing Weight: n/a  Vital Signs: Temp: 99.4 F (37.4 C) (07/15 2311) Temp Source: Oral (07/15 2311) BP: 134/75 (07/15 2311) Pulse Rate: 94 (07/15 2311)  Labs:  Recent Labs  05/23/17 1922 05/23/17 2133 05/24/17 0343 05/25/17 0423  HGB 14.0  --  13.1 12.9  HCT 42.5  --  39.1 39.5  PLT 1,234*  --  1,110* 1,155*  LABPROT  --  24.7*  --  25.7*  INR  --  2.19  --  2.30  CREATININE 0.67  --  0.55  --     Estimated Creatinine Clearance: 61.9 mL/min (by C-G formula based on SCr of 0.55 mg/dL).   Medical History: Past Medical History:  Diagnosis Date  . Anticoagulant long-term use   . Arthritis   . Collagen vascular disease ([place])   . Deep venous thrombosis (HCC)    right lower extremity  . Dependent edema   . History of bilateral hip replacements   . History of hysterectomy   . Hypertension   . Polycythemia vera ([place])   . Stroke [facility])    Pt reports she was told she had a stroke in may    Medications:  Home dose 5 mg M, W, F; 4 mg T, Th, Sa, Su.  Assessment: INR therapeutic on admission  Goal of Therapy:  INR 2-3    Plan:  Resume home regimen as above. Daily INR while on antibiotics.  Donna Christen Franciszek Platten 05/25/2017,7:20 AM

## 2017-05-25 NOTE — Progress Notes (Signed)
Yolanda Wells at [place] NAME: Yolanda Wells    MR#:  [id number]  DATE OF BIRTH:  Apr 30, 1932  SUBJECTIVE:  CHIEF COMPLAINT:   Chief Complaint  Patient presents with  . Blood Infection   -Feels some better today. Platelets are still elevated. Complaints of weakness  REVIEW OF SYSTEMS:  Review of Systems  Constitutional: Positive for malaise/fatigue. Negative for chills, fever and weight loss.  HENT: Negative for ear discharge, ear pain, hearing loss and nosebleeds.   Eyes: Negative for blurred vision, double vision and photophobia.  Respiratory: Positive for shortness of breath. Negative for cough, hemoptysis and wheezing.   Cardiovascular: Negative for chest pain, palpitations, orthopnea and leg swelling.  Gastrointestinal: Negative for abdominal pain, constipation, diarrhea, heartburn, melena, nausea and vomiting.  Genitourinary: Negative for dysuria, frequency, hematuria and urgency.  Musculoskeletal: Positive for myalgias. Negative for back pain and neck pain.  Skin: Negative for rash.  Neurological: Negative for dizziness, tremors, sensory change, speech change, focal weakness and headaches.  Endo/Heme/Allergies: Does not bruise/bleed easily.  Psychiatric/Behavioral: Negative for depression.    DRUG ALLERGIES:   Allergies  Allergen Reactions  . Sulfamethoxazole-Trimethoprim Nausea And Vomiting    VITALS:  Blood pressure 134/75, pulse 94, temperature 99.4 F (37.4 C), temperature source Oral, resp. rate 18, height 5\' 6"  (1.676 m), weight 95 kg (209 lb 6.4 oz), SpO2 95 %.  PHYSICAL EXAMINATION:  Physical Exam  GENERAL:  83 y.o.-year-old patient lying in the bed with no acute distress.  EYES: Pupils equal, round, reactive to light and accommodation. No scleral icterus. Extraocular muscles intact.  HEENT: Head atraumatic, normocephalic. Oropharynx and nasopharynx clear.  NECK:  Supple, no jugular venous distention. No thyroid  enlargement, no tenderness.  LUNGS: Normal breath sounds bilaterally, no wheezing, rhonchi or crepitation. No use of accessory muscles of respiration. Fine bibasilar crackles heard CARDIOVASCULAR: S1, S2 normal. No rubs, or gallops. Loud 2/6 systolic murmur is present ABDOMEN: Soft, nontender, nondistended. Bowel sounds present. No organomegaly or mass.  EXTREMITIES: No pedal edema, cyanosis, or clubbing. Right foot status post forefoot amputation NEUROLOGIC: Cranial nerves II through XII are intact. Muscle strength 5/5 in all extremities. Sensation intact. Gait not checked.  PSYCHIATRIC: The patient is alert and oriented x 3.  SKIN: No obvious rash, lesion, or ulcer.    LABORATORY PANEL:   CBC  Recent Labs Lab 05/25/17 0423  WBC 10.8  HGB 12.9  HCT 39.5  PLT 1,155*   ------------------------------------------------------------------------------------------------------------------  Chemistries   Recent Labs Lab 05/23/17 1922 05/24/17 0343  NA 137 138  K 4.4 4.0  CL 105 109  CO2 25 25  GLUCOSE 118* 104*  BUN 12 11  CREATININE 0.67 0.55  CALCIUM 8.5* 8.3*  AST 22  --   ALT 9*  --   ALKPHOS 81  --   BILITOT 0.9  --    ------------------------------------------------------------------------------------------------------------------  Cardiac Enzymes No results for input(s): TROPONINI in the last 168 hours. ------------------------------------------------------------------------------------------------------------------  RADIOLOGY:  Dg Chest 2 View  Result Date: 05/23/2017 CLINICAL DATA:  Shortness of breath EXAM: CHEST  2 VIEW COMPARISON:  11/24/2016, 11/23/2016, 11/22/2016 FINDINGS: Slight elevation of the right diaphragm with mild basilar atelectasis. Posterior infiltrate or atelectasis. No pleural effusion. Stable mild cardiomegaly with aortic atherosclerosis. No pneumothorax. Advanced arthritis of the shoulders. Degenerative osteophytes of the spine. Stable enlarged  right hilar structures. IMPRESSION: 1. Posterior lower lung atelectasis or infiltrate 2. Stable enlarged cardiomediastinal silhouette. Bulbous enlargement of right pulmonary artery  similar compared to prior. Electronically Signed   By: Donavan Foil M.D.   On: 05/23/2017 20:27    EKG:   Orders placed or performed during the hospital encounter of 04/09/17  . ED EKG  . ED EKG    ASSESSMENT AND PLAN:   83 year old female with past medical history significant for history of DVT on warfarin, arthritis, polycythemia vera, hypertension and stroke presents to hospital secondary to worsening shortness of breath.  #1 acute cystitis-significantly infected urine, urine cultures ordered today as add on. -On Rocephin. Follow-up blood cultures as well.  #2 right lower lobe pneumonia--on Rocephin and azithromycin. -Encouraged to do incentive spirometry.  #3 thrombocytosis with underlying polycythemia vera-oncology consulted. Platelet count greater than 1100 K -Continue hydroxyurea.  - following with [facility] and has received P32 injection on 05/14/17- as her  essential thrombocythemia was resistant to hydroxyurea. Hydroxyurea dose was cut in half for the last 10 days.  #4 history of DVT and PE-on warfarin.   PT consulted today   All the records are reviewed and case discussed with Care Management/Social Workerr. Management plans discussed with the patient, family and they are in agreement.  CODE STATUS: Full code  TOTAL TIME TAKING CARE OF THIS PATIENT: 37 minutes.   POSSIBLE D/C IN 1-2 DAYS, DEPENDING ON CLINICAL CONDITION.   [name] M.D on 05/25/2017 at 9:06 AM  Between 7am to 6pm - Pager - [phone]  After 6pm go to [email or web address] - password [email or web address] [facility]  Office  [phone]  CC: Primary care physician; [name], MD

## 2017-05-25 NOTE — Evaluation (Signed)
Physical Therapy Evaluation Patient Details Name: Yolanda Wells MRN: [id number] DOB: [birth year] Today's Date: 05/25/2017   History of Present Illness  Pt is a 83 y/o F who presents with SOB.  In the ED she was found to have pneumonia as well as a UTI.  Pt's PMH includes DVT, Bil hip replacements, stroke, R transmetatarsal amputation.    Clinical Impression  Pt admitted with above diagnosis. Pt currently with functional limitations due to the deficits listed below (see PT Problem List). Yolanda Wells presents at her baseline level of mobility, requiring mod assist to boost to standing and min assist with RW for stand pivot transfer.  Her granddaughter is her primary caregiver and the pt has a caregiver come to the house M-F from 9-11am to assist with cooking, bathing, dressing.  Pt is alone from 11am-2pm at which time her granddaughter returns home from work.  The pt has a medical alert button and a cell phone that she keeps close during the day.  Recommending 24/7 supervision for pt's safety at d/c.  Pt will benefit from skilled PT to increase their independence and safety with mobility to allow discharge to the venue listed below.      Follow Up Recommendations Home health PT;Supervision/Assistance - 24 hour    Equipment Recommendations  None recommended by PT    Recommendations for Other Services       Precautions / Restrictions Precautions Precautions: Fall Restrictions Weight Bearing Restrictions: No      Mobility  Bed Mobility Overal bed mobility: Needs Assistance Bed Mobility: Supine to Sit     Supine to sit: Min assist;HOB elevated     General bed mobility comments: Use of bed rail and pt pulls with arms with assist of PT to elevate trunk.    Transfers Overall transfer level: Needs assistance Equipment used: Rolling walker (2 wheeled) Transfers: Sit to/from [facility] Sit to Stand: Mod assist;From elevated surface Stand pivot transfers: Min assist        General transfer comment: Bed elevated (which pt reports she does at home) as pt unable to stand from standard bed height.  Mod assist to boost to standing and to remain steady.  Pt initially with posterior bias.  Ambulation/Gait             General Gait Details: Pt nonambulatory at baseline.  Stairs            Wheelchair Mobility    Modified Rankin (Stroke Patients Only)       Balance Overall balance assessment: Needs assistance Sitting-balance support: No upper extremity supported;Feet supported Sitting balance-Leahy Scale: Fair Sitting balance - Comments: Pt able to sit EOB without UE support but likely would lose her balance with perturbation   Standing balance support: Bilateral upper extremity supported;During functional activity Standing balance-Leahy Scale: Poor Standing balance comment: Pt relies on UE support for both static and dynamic activities                             Pertinent Vitals/Pain Pain Assessment: No/denies pain    Home Living Family/patient expects to be discharged to:: Private residence Living Arrangements: Other relatives (Granddaughter) Available Help at Discharge: Family;Personal care attendant;Available PRN/intermittently Type of Home: Apartment Home Access: Level entry     Home Layout: One level Home Equipment: Walker - 2 wheels;Hospital bed Additional Comments: Caregiver M-F 9-11am    Prior Function Level of Independence: Needs assistance   Gait / Transfers  Assistance Needed: Pt performs pivot transfer with assist and RW from Bed<>WC.  Pt has not ambulated in 2 years.  Pt denies having any recent falls.  Pt nonambulatory for 2 years now.  She has been working with [place] on her LE strength and standing endurance.  ADL's / Homemaking Assistance Needed: Caregiver assists with a sponge bath, dressing, brushing her hair.  Daughter does the drving, cooking, cleaning.  Pt independent with feeding.          Hand  Dominance   Dominant Hand: Right    Extremity/Trunk Assessment   Upper Extremity Assessment Upper Extremity Assessment:  (BUE strength grossly 3+/5)    Lower Extremity Assessment Lower Extremity Assessment: RLE deficits/detail;LLE deficits/detail RLE Deficits / Details: Strength grossly 3/5 LLE Deficits / Details: Strength grossly 3-/5    Cervical / Trunk Assessment Cervical / Trunk Assessment: Kyphotic  Communication   Communication: No difficulties  Cognition Arousal/Alertness: Awake/alert Behavior During Therapy: WFL for tasks assessed/performed Overall Cognitive Status: Within Functional Limits for tasks assessed                                        General Comments General comments (skin integrity, edema, etc.): SpO2 96% on RA after stand pivot transfer.    Exercises General Exercises - Lower Extremity Long Arc Quad: AROM;Both;10 reps;Seated Straight Leg Raises: AROM;Both;10 reps;Supine   Assessment/Plan    PT Assessment Patient needs continued PT services  PT Problem List Decreased strength;Decreased activity tolerance;Decreased balance;Decreased mobility;Decreased knowledge of use of DME;Decreased safety awareness       PT Treatment Interventions DME instruction;Gait training;Functional mobility training;Therapeutic activities;Therapeutic exercise;Balance training;Neuromuscular re-education;Patient/family education;Wheelchair mobility training    PT Goals (Current goals can be found in the Care Plan section)  Acute Rehab PT Goals Patient Stated Goal: to go home and continue HHPT PT Goal Formulation: With patient Time For Goal Achievement: 06/08/17 Potential to Achieve Goals: Good    Frequency Min 2X/week   Barriers to discharge Decreased caregiver support Pt alone during the day from 11am-2pm (Granddaughter back from work at [employer])    [employer] PT "6 Clicks" Daily Activity  Outcome Measure Difficulty  turning over in bed (including adjusting bedclothes, sheets and blankets)?: Total Difficulty moving from lying on back to sitting on the side of the bed? : Total Difficulty sitting down on and standing up from a chair with arms (e.g., wheelchair, bedside commode, etc,.)?: Total Help needed moving to and from a bed to chair (including a wheelchair)?: A Little Help needed walking in hospital room?: Total Help needed climbing 3-5 steps with a railing? : Total 6 Click Score: 8    End of Session Equipment Utilized During Treatment: Gait belt Activity Tolerance: Patient limited by fatigue Patient left: in chair;with call bell/phone within reach;with chair alarm set Nurse Communication: Mobility status PT Visit Diagnosis: Muscle weakness (generalized) (M62.81);Unsteadiness on feet (R26.81);Difficulty in walking, not elsewhere classified (R26.2)    Time: 3875-6433 PT Time Calculation (min) (ACUTE ONLY): 27 min   Charges:   PT Evaluation $PT Eval Low Complexity: 1 Procedure PT Treatments $Therapeutic Activity: 8-22 mins   PT G Codes:        Collie Siad PT, DPT 05/25/2017, 10:43 AM

## 2017-05-26 LAB — CBC
HCT: 38.5 % (ref 35.0–47.0)
HEMOGLOBIN: 12.9 g/dL (ref 12.0–16.0)
MCH: 30.3 pg (ref 26.0–34.0)
MCHC: 33.4 g/dL (ref 32.0–36.0)
MCV: 90.7 fL (ref 80.0–100.0)
Platelets: 1213 10*3/uL (ref 150–440)
RBC: 4.24 MIL/uL (ref 3.80–5.20)
RDW: 35.8 % — ABNORMAL HIGH (ref 11.5–14.5)
WBC: 10.5 10*3/uL (ref 3.6–11.0)

## 2017-05-26 LAB — PROTIME-INR
INR: 2.61
Prothrombin Time: 28.4 seconds — ABNORMAL HIGH (ref 11.4–15.2)

## 2017-05-26 LAB — BASIC METABOLIC PANEL
ANION GAP: 5 (ref 5–15)
BUN: 12 mg/dL (ref 6–20)
CO2: 26 mmol/L (ref 22–32)
Calcium: 8.3 mg/dL — ABNORMAL LOW (ref 8.9–10.3)
Chloride: 108 mmol/L (ref 101–111)
Creatinine, Ser: 0.61 mg/dL (ref 0.44–1.00)
GLUCOSE: 101 mg/dL — AB (ref 65–99)
POTASSIUM: 3.7 mmol/L (ref 3.5–5.1)
SODIUM: 139 mmol/L (ref 135–145)

## 2017-05-26 MED ORDER — AMLODIPINE BESYLATE 5 MG PO TABS
5.0000 mg | ORAL_TABLET | Freq: Once | ORAL | Status: AC
  Administered 2017-05-26: 5 mg via ORAL
  Filled 2017-05-26: qty 1

## 2017-05-26 MED ORDER — POLYETHYLENE GLYCOL POWD: 17.0000 g | 0 refills | Status: DC

## 2017-05-26 MED ORDER — POTASSIUM CHLORIDE CRYS ER 20 MEQ PO TBCR: 20.0000 meq | EXTENDED_RELEASE_TABLET | Freq: Every day | ORAL | 0 refills | Status: DC

## 2017-05-26 MED ORDER — AMLODIPINE BESYLATE 5 MG PO TABS: 5.0000 mg | ORAL_TABLET | Freq: Every day | ORAL | 11 refills | Status: DC

## 2017-05-26 MED ORDER — CEFUROXIME AXETIL 500 MG PO TABS: 500.0000 mg | ORAL_TABLET | Freq: Two times a day (BID) | ORAL | 0 refills | Status: DC

## 2017-05-26 NOTE — Progress Notes (Signed)
[place] for warfarin dosing Indication: VTE prophylaxis  Allergies  Allergen Reactions  . Sulfamethoxazole-Trimethoprim Nausea And Vomiting    Patient Measurements: Height: 5\' 6"  (167.6 cm) Weight: 209 lb 6.4 oz (95 kg) IBW/kg (Calculated) : 59.3 Heparin Dosing Weight: n/a  Vital Signs: Temp: 97.9 F (36.6 C) (07/17 0506) Temp Source: Oral (07/17 0506) BP: 188/88 (07/17 0957) Pulse Rate: 84 (07/17 0506)  Labs:  Recent Labs  05/23/17 1922 05/23/17 2133 05/24/17 0343 05/25/17 0423 05/26/17 0457  HGB 14.0  --  13.1 12.9 12.9  HCT 42.5  --  39.1 39.5 38.5  PLT 1,234*  --  1,110* 1,155* 1,213*  LABPROT  --  24.7*  --  25.7* 28.4*  INR  --  2.19  --  2.30 2.61  CREATININE 0.67  --  0.55  --  0.61    Estimated Creatinine Clearance: 61.9 mL/min (by C-G formula based on SCr of 0.61 mg/dL).   Medical History: Past Medical History:  Diagnosis Date  . Anticoagulant long-term use   . Arthritis   . Collagen vascular disease ([place])   . Deep venous thrombosis (HCC)    right lower extremity  . Dependent edema   . History of bilateral hip replacements   . History of hysterectomy   . Hypertension   . Polycythemia vera ([place])   . Stroke [facility])    Pt reports she was told she had a stroke in may    Medications:  Home dose 5 mg M, W, F; 4 mg T, Th, Sa, Su.  Assessment: INR therapeutic on admission  Goal of Therapy:  INR 2-3    Plan:  Resume home regimen as above. Daily INR while on antibiotics.  Yolanda Wells 05/26/2017,10:09 AM

## 2017-05-26 NOTE — Care Management Note (Addendum)
Case Management Note  Patient Details  Name: Yolanda Wells MRN: [id number] Date of Birth: May 13, 1932  Subjective/Objective:  Discharging today                 Action/Plan: [facility] notified of discharge and resumption of care. LM with granddaughter with update. Granddaughter requesting EMS home.  Medical Necessity completed.   Expected Discharge Date:  05/26/17               Expected Discharge Plan:  [place]  In-House Referral:     Discharge planning Services  CM Consult  Post Acute Care Choice:  Home Health, Resumption of Svcs/PTA Provider Choice offered to:   (Granddaughter, Field seismologist)  DME Arranged:    DME Agency:     HH Arranged:  RN, PT HH Agency:  [place]  Status of Service:  Completed, signed off  If discussed at Loop of Stay Meetings, dates discussed:    Additional Comments:  Jolly Mango, RN 05/26/2017, 11:19 AM

## 2017-05-26 NOTE — Care Management (Signed)
Granddaughter, [name],  called and informed CSW that she would like home health services resumed with [facility].

## 2017-05-26 NOTE — Care Management Important Message (Signed)
Important Message  Patient Details  Name: Yolanda Wells MRN: [id number] Date of Birth: Mar 10, 1932   Medicare Important Message Given:  Yes    Jolly Mango, RN 05/26/2017, 12:02 PM

## 2017-05-26 NOTE — Discharge Summary (Signed)
Yolanda Wells NAME: Yolanda Wells    MR#:  [id number]  DATE OF BIRTH:  10-16-1932  DATE OF ADMISSION:  05/23/2017   ADMITTING PHYSICIAN: Lance Coon, MD  DATE OF DISCHARGE:  05/26/17  PRIMARY CARE PHYSICIAN: [name], MD   ADMISSION DIAGNOSIS:   Shortness of breath [R06.02] Acute lower UTI [N39.0]  DISCHARGE DIAGNOSIS:   Principal Problem:   UTI (urinary tract infection) Active Problems:   HTN (hypertension)   Polycythemia vera (HCC)   COPD exacerbation (HCC)   CAP (community acquired pneumonia)   Chronic anticoagulation   SECONDARY DIAGNOSIS:   Past Medical History:  Diagnosis Date  . Anticoagulant long-term use   . Arthritis   . Collagen vascular disease ([place])   . Deep venous thrombosis (HCC)    right lower extremity  . Dependent edema   . History of bilateral hip replacements   . History of hysterectomy   . Hypertension   . Polycythemia vera ([place])   . Stroke [facility])    Pt reports she was told she had a stroke in may    HOSPITAL COURSE:   83 year old female with past medical history significant for history of DVT on warfarin, arthritis, polycythemia vera, hypertension and stroke presents to hospital secondary to worsening shortness of breath.  #1 acute cystitis-significantly infected urine, urine cultures ordered as add on. -received Rocephin and azithromycin while here. negative blood cultures. Discharge on ceftin  #2 right lower lobe pneumonia--on Rocephin and azithromycin. -Encouraged to do incentive spirometry. Discharge on oral ceftin Not needing o2  #3 thrombocytosis with underlying polycythemia vera- has JAK2+ polycythemia rubra vera  - oncology consulted. Platelet count greater than 1100 K -Continue hydroxyurea.  - following with [facility] and has received P32 injection on 05/14/17- as her  essential thrombocythemia was resistant to hydroxyurea. Hydroxyurea dose was cut in half for the last  10 days. - f/u as outpatient, no changes, no indication for phlebotomy as hct <42  #4 history of DVT and PE-on warfarin.  Also has h/o cerebellar CVA  PT recommended home health. She'll be discharged today  DISCHARGE CONDITIONS:   Guarded  CONSULTS OBTAINED:   Treatment Team:  Lloyd Huger, MD  DRUG ALLERGIES:   Allergies  Allergen Reactions  . Sulfamethoxazole-Trimethoprim Nausea And Vomiting   DISCHARGE MEDICATIONS:   Allergies as of 05/26/2017      Reactions   Sulfamethoxazole-trimethoprim Nausea And Vomiting      Medication List    TAKE these medications   amLODipine 5 MG tablet Commonly known as:  NORVASC Take 1 tablet (5 mg total) by mouth daily.   aspirin EC 81 MG tablet Take 1 tablet (81 mg total) by mouth daily.   cefUROXime 500 MG tablet Commonly known as:  CEFTIN Take 1 tablet (500 mg total) by mouth 2 (two) times daily with a meal. X 6 more days   docusate sodium 100 MG capsule Commonly known as:  COLACE Take 100 mg by mouth 2 (two) times daily.   folic acid 268 MCG tablet Commonly known as:  FOLVITE Take 400 mcg by mouth daily.   hydroxyurea 500 MG capsule Commonly known as:  HYDREA Take  4 tablets daily What changed:  how much to take  how to take this  when to take this  additional instructions   metoprolol tartrate 25 MG tablet Commonly known as:  LOPRESSOR Take 12.5 mg by mouth daily.   Polyethylene Glycol Powd Take  17 g by mouth every other day.   potassium chloride SA 20 MEQ tablet Commonly known as:  K-DUR,KLOR-CON Take 1 tablet (20 mEq total) by mouth daily. What changed:  when to take this   warfarin 4 MG tablet Commonly known as:  COUMADIN Take 4 mg by mouth as directed. Tuesday, Thursday, Saturday and Sunday What changed:  Another medication with the same name was changed. Make sure you understand how and when to take each.   warfarin 5 MG tablet Commonly known as:  COUMADIN Take 1 tablet (5 mg total)  by mouth daily. What changed:  when to take this  additional instructions        DISCHARGE INSTRUCTIONS:   1. PCP f/u in 1-2 weeks 2. Hematology f/u as prior scheduled  DIET:   Cardiac diet  ACTIVITY:   Activity as tolerated  OXYGEN:   Home Oxygen: No.  Oxygen Delivery: room air  DISCHARGE LOCATION:   home   If you experience worsening of your admission symptoms, develop shortness of breath, life threatening emergency, suicidal or homicidal thoughts you must seek medical attention immediately by calling 911 or calling your MD immediately  if symptoms less severe.  You Must read complete instructions/literature along with all the possible adverse reactions/side effects for all the Medicines you take and that have been prescribed to you. Take any new Medicines after you have completely understood and accpet all the possible adverse reactions/side effects.   Please note  You were cared for by a hospitalist during your hospital stay. If you have any questions about your discharge medications or the care you received while you were in the hospital after you are discharged, you can call the unit and asked to speak with the hospitalist on call if the hospitalist that took care of you is not available. Once you are discharged, your primary care physician will handle any further medical issues. Please note that NO REFILLS for any discharge medications will be authorized once you are discharged, as it is imperative that you return to your primary care physician (or establish a relationship with a primary care physician if you do not have one) for your aftercare needs so that they can reassess your need for medications and monitor your lab values.    On the day of Discharge:  VITAL SIGNS:   Blood pressure (!) 188/88, pulse 84, temperature 97.9 F (36.6 C), temperature source Oral, resp. rate 18, height 5\' 6"  (1.676 m), weight 95 kg (209 lb 6.4 oz), SpO2 94 %.  PHYSICAL  EXAMINATION:    GENERAL:  83 y.o.-year-old patient lying in the bed with no acute distress.  EYES: Pupils equal, round, reactive to light and accommodation. No scleral icterus. Extraocular muscles intact.  HEENT: Head atraumatic, normocephalic. Oropharynx and nasopharynx clear.  NECK:  Supple, no jugular venous distention. No thyroid enlargement, no tenderness.  LUNGS: Normal breath sounds bilaterally, no wheezing, rhonchi or crepitation. No use of accessory muscles of respiration. Fine bibasilar crackles heard CARDIOVASCULAR: S1, S2 normal. No rubs, or gallops. Loud 2/6 systolic murmur is present ABDOMEN: Soft, nontender, nondistended. Bowel sounds present. No organomegaly or mass.  EXTREMITIES: No pedal edema, cyanosis, or clubbing. Right foot status post forefoot amputation NEUROLOGIC: Cranial nerves II through XII are intact. Muscle strength 5/5 in all extremities. Sensation intact. Gait not checked.  PSYCHIATRIC: The patient is alert and oriented x 3.  SKIN: No obvious rash, lesion, or ulcer.   DATA REVIEW:   CBC  Recent Labs  Lab 05/26/17 0457  WBC 10.5  HGB 12.9  HCT 38.5  PLT 1,213*    Chemistries   Recent Labs Lab 05/23/17 1922  05/26/17 0457  NA 137  < > 139  K 4.4  < > 3.7  CL 105  < > 108  CO2 25  < > 26  GLUCOSE 118*  < > 101*  BUN 12  < > 12  CREATININE 0.67  < > 0.61  CALCIUM 8.5*  < > 8.3*  AST 22  --   --   ALT 9*  --   --   ALKPHOS 81  --   --   BILITOT 0.9  --   --   < > = values in this interval not displayed.   Microbiology Results  Results for orders placed or performed during the hospital encounter of 05/23/17  Culture, blood (Routine x 2)     Status: None (Preliminary result)   Collection Time: 05/23/17  7:23 PM  Result Value Ref Range Status   Specimen Description BLOOD RIGHT ANTECUBITAL  Final   Special Requests   Final    BOTTLES DRAWN AEROBIC AND ANAEROBIC Blood Culture adequate volume   Culture NO GROWTH 3 DAYS  Final   Report  Status PENDING  Incomplete  Culture, blood (Routine x 2)     Status: None (Preliminary result)   Collection Time: 05/23/17  7:37 PM  Result Value Ref Range Status   Specimen Description BLOOD RIGHT HAND  Final   Special Requests   Final    BOTTLES DRAWN AEROBIC AND ANAEROBIC Blood Culture adequate volume   Culture NO GROWTH 3 DAYS  Final   Report Status PENDING  Incomplete    RADIOLOGY:  No results found.   Management plans discussed with the patient, family and they are in agreement.  CODE STATUS:     Code Status Orders        Start     Ordered   05/23/17 2323  Full code  Continuous     05/23/17 2322    Code Status History    Date Active Date Inactive Code Status Order ID Comments User Context   04/10/2017  3:25 AM 04/11/2017  7:16 PM Full Code [id number]  Harrie Foreman, MD Inpatient   11/23/2016  6:29 AM 12/01/2016  7:35 PM Full Code [id number]  Harrie Foreman, MD Inpatient   08/26/2016  4:45 AM 08/26/2016  7:23 PM Full Code [id number]  Saundra Shelling, MD Inpatient   09/22/2015  5:55 PM 09/25/2015  6:44 PM Full Code [id number]  Demetrios Loll, MD ED   09/15/2015  3:41 AM 09/17/2015  5:41 PM Full Code [id number]  Harrie Foreman, MD Inpatient   07/16/2015  1:51 AM 07/20/2015 11:34 PM Full Code [id number]  Lance Coon, MD Inpatient   05/25/2015  8:34 AM 05/29/2015  4:16 PM Full Code [id number]  Harrie Foreman, MD Inpatient   04/02/2015  6:06 AM 04/08/2015  4:51 PM Full Code [id number]  Harrie Foreman, MD Inpatient    Advance Directive Documentation     Most Recent Value  Type of Advance Directive  Healthcare Power of [place], Living will  Pre-existing out of facility DNR order (yellow form or pink MOST form)  -  "MOST" Form in Place?  -      TOTAL TIME TAKING CARE OF THIS PATIENT: 37  minutes.    [name] M.D on 05/26/2017 at 11:05 AM  Between 7am to 6pm - Pager -  [phone]  After 6pm go to [email or web address] - Proofreader  [facility]  Hospitalists  Office  [phone]  CC: Primary care physician; [name], MD   Note: This dictation was prepared with Dragon dictation along with smaller phrase technology. Any transcriptional errors that result from this process are unintentional.

## 2017-05-26 NOTE — Consult Note (Signed)
[facility]  Date of admission:  05/23/2017  Inpatient day:  05/25/2017  Consulting physician: Dr Lance Coon   Reason for Consultation:  P vera, platelets 1.2 million.  Chief Complaint: Yolanda Wells is a 83 y.o. female  With polycythema rubra vera who was admitted through the emergency room with pneumonia and a UTI.  HPI:  The patient presented with shortness of breath.  CXR revealed posterior lower lung atelectasis or infiltrate.  Urinalysis revealed + nitrites, leukocytes, and many bacteria.  She denied any UTI symptoms. Cultures negative to date.  She is currently being treated with Rocephin and azithromycin.    CBC on admission revealed a platelet count of 1.234 million.  Platelet count today is 1.155 million.  Hematocrit is 39.5 with a WBC of 10,800.  Symptomatically, she feels better.   Past Medical History:  Diagnosis Date  . Anticoagulant long-term use   . Arthritis   . Collagen vascular disease ([place])   . Deep venous thrombosis (HCC)    right lower extremity  . Dependent edema   . History of bilateral hip replacements   . History of hysterectomy   . Hypertension   . Polycythemia vera ([place])   . Stroke [facility])    Pt reports she was told she had a stroke in may    Past Surgical History:  Procedure Laterality Date  . ABDOMINAL HYSTERECTOMY    . CARDIAC CATHETERIZATION Right 04/03/2015   Procedure: CENTRAL LINE INSERTION;  Surgeon: Sherri Rad, MD;  Location: [facility] ORS;  Service: General;  Laterality: Right;  . COLECTOMY WITH COLOSTOMY CREATION/HARTMANN PROCEDURE N/A 04/03/2015   Procedure: COLECTOMY WITH COLOSTOMY CREATION/HARTMANN PROCEDURE;  Surgeon: Sherri Rad, MD;  Location: [facility] ORS;  Service: General;  Laterality: N/A;  . JOINT REPLACEMENT     Left and Right Hip  . PERIPHERAL VASCULAR CATHETERIZATION N/A 04/02/2015   Procedure: Visceral Angiography;  Surgeon: Algernon Huxley, MD;  Location: [place] [facility];  Service: Cardiovascular;  Laterality:  N/A;  . PERIPHERAL VASCULAR CATHETERIZATION N/A 04/02/2015   Procedure: Visceral Artery Intervention;  Surgeon: Algernon Huxley, MD;  Location: [place] [facility];  Service: Cardiovascular;  Laterality: N/A;  . PERIPHERAL VASCULAR CATHETERIZATION N/A 05/25/2015   Procedure: IVC Filter Insertion;  Surgeon: Katha Cabal, MD;  Location: [place] [facility];  Service: Cardiovascular;  Laterality: N/A;  . TOE AMPUTATION     right    Family History  Problem Relation Age of Onset  . Heart attack Father   . COPD Father   . Hypertension Unknown   . Arthritis-Osteo Unknown   . Diabetes Brother   . Osteosarcoma Son     Social History:  reports that she has never smoked. She has never used smokeless tobacco. She reports that she does not drink alcohol or use drugs.  She is alone today.  Allergies:  Allergies  Allergen Reactions  . Sulfamethoxazole-Trimethoprim Nausea And Vomiting    Medications Prior to Admission  Medication Sig Dispense Refill  . aspirin EC 81 MG tablet Take 1 tablet (81 mg total) by mouth daily. 30 tablet 2  . docusate sodium (COLACE) 100 MG capsule Take 100 mg by mouth 2 (two) times daily.     . folic acid (FOLVITE) 099 MCG tablet Take 400 mcg by mouth daily.    . hydroxyurea (HYDREA) 500 MG capsule Take  4 tablets daily (Patient taking differently: Take 1,000 mg by mouth daily. Take  4 tablets daily) 120 capsule 1  .  metoprolol tartrate (LOPRESSOR) 25 MG tablet Take 12.5 mg by mouth daily.     . Polyethylene Glycol POWD Take 17 g by mouth daily. (Patient taking differently: Take 17 g by mouth every other day. ) 527 g 5  . potassium chloride SA (K-DUR,KLOR-CON) 20 MEQ tablet Take 1 tablet (20 mEq total) by mouth 2 (two) times daily. 30 tablet 0  . warfarin (COUMADIN) 4 MG tablet Take 4 mg by mouth as directed. Tuesday, Thursday, Saturday and Sunday     . warfarin (COUMADIN) 5 MG tablet Take 1 tablet (5 mg total) by mouth daily. (Patient taking differently: Take 5 mg by  mouth 3 (three) times a week. Monday, Wednesday, Friday) 30 tablet 3    Review of Systems: GENERAL:  Fatigue.  No fevers.  No weight loss. PERFORMANCE STATUS (ECOG):  3 HEENT:  Decreased vision in right eye s/p embolic event.  No runny nose, sore throat, mouth sores or tenderness. Lungs:  Shortness of breath, improved.  No cough.  No hemoptysis. Cardiac:  No chest pain, palpitations, orthopnea, or PND. GI:  No nausea, vomiting, diarrhea, constipation, melena or hematochezia.  Interval blood in stool, resolved. GU:  No urgency, frequency, dysuria, or hematuria. Musculoskeletal:  Knee problems ("bone on bone").  No muscle tenderness. Extremities:  Chronic swelling in legs. Skin:  Dry skin. Neuro:  No headache, numbness or weakness, balance or coordination issues. Endocrine:  No diabetes, thyroid issues, hot flashes or night sweats. Psych:  No mood changes, depression or anxiety. Pain:  No focal pain. Review of systems:  All other systems reviewed and found to be negative.  Physical Exam:  Blood pressure (!) 151/85, pulse 85, temperature 98.9 F (37.2 C), temperature source Oral, resp. rate 18, height 5\' 6"  (1.676 m), weight 209 lb 6.4 oz (95 kg), SpO2 97 %.  GENERAL:  Elderly woman sitting comfortably in a lounge chair with her legs propped up in no acute distress. MENTAL STATUS:  Alert and oriented to person, place and time. HEAD: Pearline Cables hair.  Normocephalic, atraumatic, face symmetric, no Cushingoid features. EYES:  Brown eyes.  s/p right sided entropion surgery.  Pupils equal round and reactive to light and accomodation.  No conjunctivitis or scleral icterus. ENT:  Oropharynx clear without lesion.  Upper dentures.  Tongue normal. Mucous membranes moist.  RESPIRATORY:  Clear to auscultation without rales, wheezes or rhonchi. CARDIOVASCULAR:  Regular rate and rhythm without murmur, rub or gallop. ABDOMEN:  Left sided colostomy.  Soft, non-tender, with active bowel sounds, and no  appreciable hepatosplenomegaly.  No masses. SKIN:  No rashes or ulcers . EXTREMITIES:  Chronic lower extremity changes.  No skin discoloration or tenderness.  No palpable cords. LYMPH NODES: No palpable cervical, supraclavicular, axillary or inguinal adenopathy  NEUROLOGICAL: Unremarkable. PSYCH:  Appropriate.   Results for orders placed or performed during the hospital encounter of 05/23/17 (from the past 48 hour(s))  Protime-INR     Status: Abnormal   Collection Time: 05/25/17  4:23 AM  Result Value Ref Range   Prothrombin Time 25.7 (H) 11.4 - 15.2 seconds   INR 2.30   CBC     Status: Abnormal   Collection Time: 05/25/17  4:23 AM  Result Value Ref Range   WBC 10.8 3.6 - 11.0 K/uL   RBC 4.41 3.80 - 5.20 MIL/uL   Hemoglobin 12.9 12.0 - 16.0 g/dL   HCT 39.5 35.0 - 47.0 %   MCV 89.6 80.0 - 100.0 fL   MCH 29.3 26.0 -  34.0 pg   MCHC 32.7 32.0 - 36.0 g/dL   RDW 33.5 (H) 11.5 - 14.5 %   Platelets 1,155 (HH) 150 - 440 K/uL    Comment: RESULT REPEATED AND VERIFIED CRITICAL VALUE NOTED.  VALUE IS CONSISTENT WITH PREVIOUSLY REPORTED AND CALLED VALUE.    No results found.  Assessment:  The patient is a 83 y.o. woman with JAK2+ polycythemia rubra vera admitted with pneumonia and a UTI.  She has been on hydroxyurea with difficult to control counts.  She received P32 in an attempt to manage her counts in 03/2015.  Counts improved.  This procedure was repeated on 05/14/2017.  She has a history of cerebellar CVA, bilateral lower extremity DVTs and pulmonary emboli.  She is s/p IVC filter placement.  She is on Coumadin and a baby aspirin.  INR goal is 2-3.  Plan:   1.  Hematology:  Polycythemia rubra vera.  Hematocrit 39.5 (goal <= 42).  Platelet count 1.555 million (goal 400,000).  Await response to recent P32.  No intervention needed at this time.  Continue Coumadin and baby aspirin.  2.  Infectious disease:  Patient on azitromycin and Ceftriaxone for pneumonia and a UTI.  3.   Disposition:  Anticipate discharge home in next 1-2 days.  Will follow patient in outpatient department.  Thank you for allowing me to participate in Yolanda Wells 's care.  I will follow her closely with you while hospitalized and after discharge in the outpatient department.   Lequita Asal, MD  05/25/2017

## 2017-05-27 ENCOUNTER — Inpatient Hospital Stay
Admission: EM | Admit: 2017-05-27 | Discharge: 2017-05-28 | DRG: 204 | Disposition: A | Discharge status: Home / Self Care | Payer: Medicare Other | Attending: Internal Medicine | Admitting: Internal Medicine

## 2017-05-27 ENCOUNTER — Emergency Department: Payer: Medicare Other

## 2017-05-27 ENCOUNTER — Encounter: Payer: Self-pay | Admitting: *Deleted

## 2017-05-27 DIAGNOSIS — Z96643 Presence of artificial hip joint, bilateral: Secondary | ICD-10-CM | POA: Diagnosis present

## 2017-05-27 DIAGNOSIS — I700 Atherosclerosis of aorta: Secondary | ICD-10-CM | POA: Diagnosis present

## 2017-05-27 DIAGNOSIS — Z933 Colostomy status: Secondary | ICD-10-CM | POA: Diagnosis not present

## 2017-05-27 DIAGNOSIS — Z808 Family history of malignant neoplasm of other organs or systems: Secondary | ICD-10-CM

## 2017-05-27 DIAGNOSIS — Z86718 Personal history of other venous thrombosis and embolism: Secondary | ICD-10-CM | POA: Diagnosis not present

## 2017-05-27 DIAGNOSIS — J9811 Atelectasis: Secondary | ICD-10-CM | POA: Diagnosis present

## 2017-05-27 DIAGNOSIS — R0600 Dyspnea, unspecified: Principal | ICD-10-CM | POA: Diagnosis present

## 2017-05-27 DIAGNOSIS — D45 Polycythemia vera: Secondary | ICD-10-CM | POA: Diagnosis present

## 2017-05-27 DIAGNOSIS — Z89421 Acquired absence of other right toe(s): Secondary | ICD-10-CM | POA: Diagnosis not present

## 2017-05-27 DIAGNOSIS — Z862 Personal history of diseases of the blood and blood-forming organs and certain disorders involving the immune mechanism: Secondary | ICD-10-CM | POA: Diagnosis not present

## 2017-05-27 DIAGNOSIS — R531 Weakness: Secondary | ICD-10-CM | POA: Diagnosis present

## 2017-05-27 DIAGNOSIS — Z7901 Long term (current) use of anticoagulants: Secondary | ICD-10-CM

## 2017-05-27 DIAGNOSIS — R0602 Shortness of breath: Secondary | ICD-10-CM | POA: Diagnosis present

## 2017-05-27 DIAGNOSIS — J189 Pneumonia, unspecified organism: Secondary | ICD-10-CM | POA: Diagnosis present

## 2017-05-27 DIAGNOSIS — Z86711 Personal history of pulmonary embolism: Secondary | ICD-10-CM | POA: Diagnosis not present

## 2017-05-27 DIAGNOSIS — M1990 Unspecified osteoarthritis, unspecified site: Secondary | ICD-10-CM | POA: Diagnosis present

## 2017-05-27 DIAGNOSIS — Z8249 Family history of ischemic heart disease and other diseases of the circulatory system: Secondary | ICD-10-CM

## 2017-05-27 DIAGNOSIS — Z90710 Acquired absence of both cervix and uterus: Secondary | ICD-10-CM

## 2017-05-27 DIAGNOSIS — Z7982 Long term (current) use of aspirin: Secondary | ICD-10-CM

## 2017-05-27 DIAGNOSIS — Z993 Dependence on wheelchair: Secondary | ICD-10-CM

## 2017-05-27 DIAGNOSIS — I119 Hypertensive heart disease without heart failure: Secondary | ICD-10-CM | POA: Diagnosis present

## 2017-05-27 DIAGNOSIS — Z79899 Other long term (current) drug therapy: Secondary | ICD-10-CM

## 2017-05-27 DIAGNOSIS — Z833 Family history of diabetes mellitus: Secondary | ICD-10-CM

## 2017-05-27 DIAGNOSIS — Z882 Allergy status to sulfonamides status: Secondary | ICD-10-CM

## 2017-05-27 DIAGNOSIS — Z8673 Personal history of transient ischemic attack (TIA), and cerebral infarction without residual deficits: Secondary | ICD-10-CM

## 2017-05-27 DIAGNOSIS — Z825 Family history of asthma and other chronic lower respiratory diseases: Secondary | ICD-10-CM

## 2017-05-27 LAB — COMPREHENSIVE METABOLIC PANEL
ALBUMIN: 2.8 g/dL — AB (ref 3.5–5.0)
ALK PHOS: 76 U/L (ref 38–126)
ALT: 10 U/L — AB (ref 14–54)
AST: 18 U/L (ref 15–41)
Anion gap: 8 (ref 5–15)
BUN: 9 mg/dL (ref 6–20)
CALCIUM: 8.9 mg/dL (ref 8.9–10.3)
CO2: 25 mmol/L (ref 22–32)
CREATININE: 0.62 mg/dL (ref 0.44–1.00)
Chloride: 108 mmol/L (ref 101–111)
GFR calc Af Amer: >60 mL/min (ref >60)
GFR calc non Af Amer: >60 mL/min (ref >60)
GLUCOSE: 104 mg/dL — AB (ref 65–99)
Potassium: 4 mmol/L (ref 3.5–5.1)
SODIUM: 141 mmol/L (ref 135–145)
Total Bilirubin: 0.7 mg/dL (ref 0.3–1.2)
Total Protein: 7.2 g/dL (ref 6.5–8.1)

## 2017-05-27 LAB — CBC
HCT: 41 % (ref 35.0–47.0)
HEMOGLOBIN: 13.8 g/dL (ref 12.0–16.0)
MCH: 30.2 pg (ref 26.0–34.0)
MCHC: 33.8 g/dL (ref 32.0–36.0)
MCV: 89.5 fL (ref 80.0–100.0)
Platelets: 1540 10*3/uL (ref 150–440)
RBC: 4.58 MIL/uL (ref 3.80–5.20)
RDW: 36.6 % — ABNORMAL HIGH (ref 11.5–14.5)
WBC: 13.6 10*3/uL — ABNORMAL HIGH (ref 3.6–11.0)

## 2017-05-27 LAB — PROTIME-INR
INR: 2.37
Prothrombin Time: 26.3 seconds — ABNORMAL HIGH (ref 11.4–15.2)

## 2017-05-27 LAB — TROPONIN I: Troponin I: <0.03 ng/mL (ref <0.03)

## 2017-05-27 MED ORDER — CEFTRIAXONE SODIUM 1 G IJ SOLR
1.0000 g | Freq: Once | INTRAMUSCULAR | Status: AC
  Administered 2017-05-27: 1 g via INTRAMUSCULAR
  Filled 2017-05-27: qty 10

## 2017-05-27 MED ORDER — CEFTRIAXONE SODIUM 1 G IJ SOLR
1.0000 g | INTRAMUSCULAR | Status: DC
  Filled 2017-05-27: qty 10

## 2017-05-27 MED ORDER — AMLODIPINE BESYLATE 5 MG PO TABS
5.0000 mg | ORAL_TABLET | Freq: Every day | ORAL | Status: DC
  Administered 2017-05-28: 5 mg via ORAL
  Filled 2017-05-27: qty 1

## 2017-05-27 MED ORDER — LIDOCAINE HCL (PF) 1 % IJ SOLN
INTRAMUSCULAR | Status: AC
  Administered 2017-05-27: 5 mL
  Filled 2017-05-27: qty 5

## 2017-05-27 MED ORDER — HYDROXYUREA 500 MG PO CAPS
1000.0000 mg | ORAL_CAPSULE | Freq: Every day | ORAL | Status: DC
  Administered 2017-05-27: 21:00:00 1000 mg via ORAL
  Filled 2017-05-27 (×2): qty 2

## 2017-05-27 MED ORDER — DOCUSATE SODIUM 100 MG PO CAPS
100.0000 mg | ORAL_CAPSULE | Freq: Two times a day (BID) | ORAL | Status: DC
  Administered 2017-05-27 – 2017-05-28 (×2): 100 mg via ORAL
  Filled 2017-05-27 (×2): qty 1

## 2017-05-27 MED ORDER — ASPIRIN EC 81 MG PO TBEC: 81.0000 mg | DELAYED_RELEASE_TABLET | Freq: Every day | ORAL | Status: DC

## 2017-05-27 MED ORDER — ASPIRIN EC 81 MG PO TBEC
81.0000 mg | DELAYED_RELEASE_TABLET | Freq: Every day | ORAL | Status: DC
  Administered 2017-05-27 – 2017-05-28 (×2): 81 mg via ORAL
  Filled 2017-05-27 (×2): qty 1

## 2017-05-27 MED ORDER — WARFARIN SODIUM 4 MG PO TABS
4.0000 mg | ORAL_TABLET | ORAL | Status: DC
  Filled 2017-05-27: qty 1

## 2017-05-27 MED ORDER — IPRATROPIUM-ALBUTEROL 0.5-2.5 (3) MG/3ML IN SOLN
3.0000 mL | Freq: Once | RESPIRATORY_TRACT | Status: AC
  Administered 2017-05-27: 3 mL via RESPIRATORY_TRACT
  Filled 2017-05-27: qty 3

## 2017-05-27 MED ORDER — IPRATROPIUM-ALBUTEROL 0.5-2.5 (3) MG/3ML IN SOLN
3.0000 mL | RESPIRATORY_TRACT | Status: DC
  Administered 2017-05-27 – 2017-05-28 (×5): 3 mL via RESPIRATORY_TRACT
  Filled 2017-05-27 (×6): qty 3

## 2017-05-27 MED ORDER — POTASSIUM CHLORIDE CRYS ER 20 MEQ PO TBCR
20.0000 meq | EXTENDED_RELEASE_TABLET | Freq: Every day | ORAL | Status: DC
  Administered 2017-05-27 – 2017-05-28 (×2): 20 meq via ORAL
  Filled 2017-05-27 (×2): qty 1

## 2017-05-27 MED ORDER — WARFARIN - PHARMACIST DOSING INPATIENT: Freq: Every day | Status: DC

## 2017-05-27 MED ORDER — DEXTROSE 5 % IV SOLN
250.0000 mg | Freq: Every day | INTRAVENOUS | Status: DC
  Administered 2017-05-27: 250 mg via INTRAVENOUS
  Filled 2017-05-27: qty 250

## 2017-05-27 MED ORDER — FOLIC ACID 0.5 MG HALF TAB
400.0000 ug | ORAL_TABLET | Freq: Every day | ORAL | Status: DC
  Administered 2017-05-27 – 2017-05-28 (×2): 0.5 mg via ORAL
  Filled 2017-05-27 (×2): qty 1

## 2017-05-27 MED ORDER — VANCOMYCIN HCL IN DEXTROSE 1-5 GM/200ML-% IV SOLN
1000.0000 mg | Freq: Once | INTRAVENOUS | Status: AC
  Administered 2017-05-27: 1000 mg via INTRAVENOUS
  Filled 2017-05-27: qty 200

## 2017-05-27 MED ORDER — DEXTROSE 5 % IV SOLN
INTRAVENOUS | Status: AC
  Filled 2017-05-27: qty 10

## 2017-05-27 MED ORDER — LIDOCAINE HCL (PF) 1 % IJ SOLN
INTRAMUSCULAR | Status: AC
  Filled 2017-05-27: qty 5

## 2017-05-27 MED ORDER — DEXTROSE 5 % IV SOLN: 1.0000 g | Freq: Once | INTRAVENOUS | Status: DC

## 2017-05-27 MED ORDER — DOCUSATE SODIUM 100 MG PO CAPS: 100.0000 mg | ORAL_CAPSULE | Freq: Two times a day (BID) | ORAL | Status: DC | PRN

## 2017-05-27 MED ORDER — METOPROLOL TARTRATE 25 MG PO TABS
12.5000 mg | ORAL_TABLET | Freq: Every day | ORAL | Status: DC
Start: 2017-05-28 — End: 2017-05-28
  Administered 2017-05-28: 09:00:00 12.5 mg via ORAL
  Filled 2017-05-27: qty 1

## 2017-05-27 MED ORDER — WARFARIN SODIUM 5 MG PO TABS: 5.0000 mg | ORAL_TABLET | ORAL | Status: DC

## 2017-05-27 MED ORDER — POLYETHYLENE GLYCOL 3350 17 G PO PACK
17.0000 g | PACK | ORAL | Status: DC
  Administered 2017-05-28: 09:00:00 17 g via ORAL
  Filled 2017-05-27: qty 1

## 2017-05-27 NOTE — ED Notes (Signed)
Informed RN bed ready  [phone]

## 2017-05-27 NOTE — ED Notes (Signed)
Admitting MD at bedside.

## 2017-05-27 NOTE — Consult Note (Signed)
ANTICOAGULATION CONSULT NOTE - Initial Consult  Pharmacy Consult for warfarin Indication: hx of DVT JAK2 mutation  Allergies  Allergen Reactions  . Sulfamethoxazole-Trimethoprim Nausea And Vomiting    Patient Measurements: Height: 5\' 7"  (170.2 cm) Weight: 206 lb 6.4 oz (93.6 kg) IBW/kg (Calculated) : 61.6 Heparin Dosing Weight:   Vital Signs: Temp: 98.1 F (36.7 C) (07/18 1859) Temp Source: Oral (07/18 1859) BP: 123/58 (07/18 1859) Pulse Rate: 97 (07/18 1859)  Labs:  Recent Labs  05/25/17 0423 05/26/17 0457 05/27/17 1135  HGB 12.9 12.9 13.8  HCT 39.5 38.5 41.0  PLT 1,155* 1,213* 1,540*  LABPROT 25.7* 28.4* 26.3*  INR 2.30 2.61 2.37  CREATININE  --  0.61 0.62  TROPONINI  --   --  <0.03    Estimated Creatinine Clearance: 62.6 mL/min (by C-G formula based on SCr of 0.62 mg/dL).   Medical History: Past Medical History:  Diagnosis Date  . Anticoagulant long-term use   . Arthritis   . Collagen vascular disease ([place])   . Deep venous thrombosis (HCC)    right lower extremity  . Dependent edema   . History of bilateral hip replacements   . History of hysterectomy   . Hypertension   . Polycythemia vera ([place])   . Stroke [facility])    Pt reports she was told she had a stroke in may    Medications:  Scheduled:  . [START ON 05/28/2017] amLODipine  5 mg Oral Daily  . [START ON 05/28/2017] aspirin EC  81 mg Oral Daily  . docusate sodium  100 mg Oral BID  . folic acid  810 mcg Oral Daily  . hydroxyurea  1,000 mg Oral Daily  . ipratropium-albuterol  3 mL Nebulization Q4H  . lidocaine (PF)      . [START ON 05/28/2017] metoprolol tartrate  12.5 mg Oral Daily  . [START ON 05/28/2017] polyethylene glycol  17 g Oral QODAY  . potassium chloride SA  20 mEq Oral Daily  . [START ON 05/28/2017] warfarin  4 mg Oral Once per day on Sun Tue Thu Sat  . [START ON 05/29/2017] warfarin  5 mg Oral Once per day on Mon Wed Fri  . [START ON 05/28/2017] Warfarin - Pharmacist Dosing Inpatient    Does not apply q1800    Assessment: Pt is a 83 year old female who presents after being d/c yesterday with SOB. Pt has a hx of DVT w/ JAK2+ polycythema rubra vera. Home dose of warfarin is 5 mg M, W, F; 4 mg T, Th, Sa, Su. INR is therapeutic on admission.  Goal of Therapy:  INR 2-3 Monitor platelets by anticoagulation protocol: Yes   Plan:  Continue home dose of 5mg  MWF and 4mg  TThSatSun. INR daily while on abx (azithromycin)  Melissa D Maccia, Pharm.D, BCPS Clinical Pharmacist  05/27/2017,8:20 PM

## 2017-05-27 NOTE — ED Triage Notes (Signed)
Pt arrives via EMS from home, pt was discharged yesterday from hospital with pneumonia, states she has not taken her abx, states SOB and cough this AM, arrives awake and alert in no acute distress

## 2017-05-27 NOTE — ED Notes (Signed)
Pt resting in bed, pt awake of pending admission, resting in bed eyes closed

## 2017-05-27 NOTE — ED Provider Notes (Signed)
[facility] [place] Emergency Department Provider Note ____________________________________________   I have reviewed the triage vital signs and the triage nursing note.  HISTORY  Chief Complaint Shortness of Breath   Historian Patient, caregiver, and 2 granddaughters  HPI Yolanda Wells is a 83 y.o. 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  She was at home with her granddaughters and the caretaker comes during the day. Next the next line caretaker describe that when she came [place] morning she found the patient sitting up in her bed extremely dyspneic and somewhat confused. Typically when the caregiver gets there she is still laying in bed.  Patient states that she wanted to get up and was calling for her granddaughters, but they were still sleeping.  Patient is able to state that she was doing more than she typically does and became extremely winded. It does not sound like she tried a breathing treatment at home although she does have a nebulizer. Denies any chest pain.  Caregiver states that she is not confused anymore, and she is not having more respiratory troubles now in the ER.  She does not wear home oxygen. Next line next line she was discharged with Ceftin antibiotic for UTI and pneumonia, but had not picked up that prescription or taken a dose today yet.    Past Medical History:  Diagnosis Date  . Anticoagulant long-term use   . Arthritis   . Collagen vascular disease ([place])   . Deep venous thrombosis (HCC)    right lower extremity  . Dependent edema   . History of bilateral hip replacements   . History of hysterectomy   . Hypertension   . Polycythemia vera ([place])   . Stroke [facility])    Pt  reports she was told she had a stroke in may    Patient Active Problem List   Diagnosis Date Noted  . Ileus ([place]) 04/10/2017  . Influenza with respiratory manifestation 12/01/2016  . Respiratory distress 11/23/2016  . Urinary incontinence in female 09/02/2016  . Vision loss of right eye 08/26/2016  . Blindness of right eye 08/26/2016  . Inability to ambulate due to hip 10/12/2015  . Colostomy in place [facility]) 10/04/2015  . Chronic anticoagulation 10/03/2015  . Acute pulmonary embolism ([place]) 10/03/2015  . Leukocytosis 09/25/2015  . Chest pain 09/25/2015  . COPD exacerbation ([place]) 09/24/2015  . CAP (community acquired pneumonia) 09/24/2015  . COPD (chronic obstructive pulmonary disease) ([place]) 09/24/2015  . Dyspnea 09/22/2015  . Elevated troponin 09/15/2015  . UTI (urinary tract infection) 07/16/2015  . Abdominal wall cellulitis 07/15/2015  . DVT (deep venous thrombosis) ([place]) 07/15/2015  . HTN (hypertension) 07/15/2015  . Polycythemia vera ([place]) 07/15/2015  . Dependent edema 07/15/2015  . Arthritis 07/15/2015  . Pulmonary emboli ([place]) 05/25/2015  . Diverticulosis of colon with hemorrhage 04/02/2015  . Diverticulosis of intestine with bleeding 04/02/2015  . Dehydration, moderate 01/11/2015  . Fall 01/10/2015  . Osteoarthritis 01/10/2015  . Hammertoe 09/02/2012  . S/P transmetatarsal amputation of foot ([place]) 09/02/2012  . Onychomycosis 09/02/2012  . Other specified dermatoses 09/02/2012  . Hip pain 08/23/2012  . S/P hip replacement 08/23/2012  . Glenohumeral arthritis 06/28/2012    Past Surgical History:  Procedure Laterality Date  .  ABDOMINAL HYSTERECTOMY    . CARDIAC CATHETERIZATION Right 04/03/2015   Procedure: CENTRAL LINE INSERTION;  Surgeon: Sherri Rad, MD;  Location: [facility] ORS;  Service: General;  Laterality: Right;  . COLECTOMY WITH COLOSTOMY CREATION/HARTMANN PROCEDURE N/A 04/03/2015   Procedure: COLECTOMY WITH COLOSTOMY CREATION/HARTMANN PROCEDURE;  Surgeon: Sherri Rad, MD;  Location: [facility] ORS;  Service: General;  Laterality: N/A;  . JOINT REPLACEMENT     Left and Right Hip  . PERIPHERAL VASCULAR CATHETERIZATION N/A 04/02/2015   Procedure: Visceral Angiography;  Surgeon: Algernon Huxley, MD;  Location: [place] [facility];  Service: Cardiovascular;  Laterality: N/A;  . PERIPHERAL VASCULAR CATHETERIZATION N/A 04/02/2015   Procedure: Visceral Artery Intervention;  Surgeon: Algernon Huxley, MD;  Location: [place] [facility];  Service: Cardiovascular;  Laterality: N/A;  . PERIPHERAL VASCULAR CATHETERIZATION N/A 05/25/2015   Procedure: IVC Filter Insertion;  Surgeon: Katha Cabal, MD;  Location: [place] [facility];  Service: Cardiovascular;  Laterality: N/A;  . TOE AMPUTATION     right    Prior to Admission medications   Medication Sig Start Date End Date Taking? Authorizing Provider  amLODipine (NORVASC) 5 MG tablet Take 1 tablet (5 mg total) by mouth daily. 05/26/17 05/26/18 Yes [name], MD  aspirin EC 81 MG tablet Take 1 tablet (81 mg total) by mouth daily. 09/16/15  Yes [name], MD  cefUROXime (CEFTIN) 500 MG tablet Take 1 tablet (500 mg total) by mouth 2 (two) times daily with a meal. X 6 more days 05/26/17  Yes [name], MD  docusate sodium (COLACE) 100 MG capsule Take 100 mg by mouth 2 (two) times daily.    Yes [provider]  folic acid (FOLVITE) 350 MCG tablet Take 400 mcg by mouth daily.   Yes [provider]  hydroxyurea (HYDREA) 500 MG capsule Take  4 tablets daily Patient taking differently: Take 1,000 mg by mouth daily. Take  4 tablets daily 04/21/17  Yes Corcoran, Melissa C, MD  metoprolol tartrate (LOPRESSOR) 25 MG tablet Take 12.5 mg by mouth daily.    Yes [provider]  Polyethylene Glycol POWD Take 17 g by mouth every other day. 05/26/17  Yes [name], MD  potassium chloride SA (K-DUR,KLOR-CON) 20 MEQ tablet Take 1 tablet (20 mEq total) by mouth daily. 05/26/17  Yes  [name], MD  warfarin (COUMADIN) 4 MG tablet Take 4 mg by mouth as directed. Tuesday, Thursday, Saturday and Sunday    Yes [provider]  warfarin (COUMADIN) 5 MG tablet Take 1 tablet (5 mg total) by mouth daily. Patient taking differently: Take 5 mg by mouth 3 (three) times a week. Monday, Wednesday, Friday 03/13/17  Yes Lequita Asal, MD    Allergies  Allergen Reactions  . Sulfamethoxazole-Trimethoprim Nausea And Vomiting    Family History  Problem Relation Age of Onset  . Heart attack Father   . COPD Father   . Hypertension Unknown   . Arthritis-Osteo Unknown   . Diabetes Brother   . Osteosarcoma Son     Social History Social History  Substance Use Topics  . Smoking status: Never Smoker  . Smokeless tobacco: Never Used  . Alcohol use No    Review of Systems  Constitutional: Negative for fever. Eyes: Negative for visual changes. ENT: Negative for sore throat. Cardiovascular: Negative for chest pain. Respiratory: Positive for shortness of breath. Gastrointestinal: Negative for abdominal pain, vomiting and diarrhea. Genitourinary: Negative for dysuria. Musculoskeletal: Negative for back pain. Skin:  Negative for rash. Neurological: Negative for headache.  ____________________________________________   PHYSICAL EXAM:  VITAL SIGNS: ED Triage Vitals  Enc Vitals Group     BP 05/27/17 1002 (!) 144/78     Pulse Rate 05/27/17 1002 97     Resp 05/27/17 1002 18     Temp 05/27/17 1002 98.6 F (37 C)     Temp Source 05/27/17 1002 Oral     SpO2 05/27/17 1000 96 %     Weight 05/27/17 1003 209 lb (94.8 kg)     Height 05/27/17 1003 5\' 6"  (1.676 m)     Head Circumference --      Peak Flow --      Pain Score 05/27/17 1002 9     Pain Loc --      Pain Edu? --      Excl. in [place]? --      Constitutional: Alert and oriented. Well appearing and in no distress. HEENT   Head: Normocephalic and atraumatic.      Eyes: Conjunctivae are normal.  Right eye somewhat cloudy      Ears:         Nose: No congestion/rhinnorhea.   Mouth/Throat: Mucous membranes are moist.   Neck: No stridor. Cardiovascular/Chest: Normal rate, regular rhythm.  No murmurs, rubs, or gallops. Respiratory: Normal respiratory effort without tachypnea nor retractions. Breath sounds are clear and equal bilaterally. Mild rhonchi, mild wheezing. No rales and no respiratory distress. Gastrointestinal: Soft. No distention, no guarding, no rebound. Nontender.    Genitourinary/rectal:Deferred Musculoskeletal: Nontender with normal range of motion in all extremities. No joint effusions.  No lower extremity tenderness.  No edema. Neurologic:  Normal speech and language. No gross or focal neurologic deficits are appreciated. Skin:  Skin is warm, dry and intact. No rash noted. Psychiatric: Mood and affect are normal. Speech and behavior are normal. Patient exhibits appropriate insight and judgment.   ____________________________________________  LABS (pertinent positives/negatives)  Labs Reviewed  CBC - Abnormal; Notable for the following:       Result Value   WBC 13.6 (*)    RDW 36.6 (*)    Platelets 1,540 (*)    All other components within normal limits  COMPREHENSIVE METABOLIC PANEL - Abnormal; Notable for the following:    Glucose, Bld 104 (*)    Albumin 2.8 (*)    ALT 10 (*)    All other components within normal limits  PROTIME-INR - Abnormal; Notable for the following:    Prothrombin Time 26.3 (*)    All other components within normal limits  TROPONIN I    ____________________________________________    EKG I, Lisa Roca, MD, the attending physician have personally viewed and interpreted all ECGs.  96 bpm. Irregularly irregular, but appears to be normal sinus rhythm with PACs. A branch block. Q wave inferiorly. Nonspecific ST and T-wave. ____________________________________________  RADIOLOGY All Xrays were viewed by me. Imaging  interpreted by Radiologist.  Chest x-ray two-view:  IMPRESSION: Chronic volume loss and infiltrate in the right lower lobe. Similar to the previous studies.  Cardiomegaly and aortic atherosclerosis. __________________________________________  PROCEDURES  Procedure(s) performed: None  Critical Care performed: None  ____________________________________________   ED COURSE / ASSESSMENT AND PLAN  Pertinent labs & imaging results that were available during my care of the patient were reviewed by me and considered in my medical decision making (see chart for details).  Ms. Droessler presents one day after discharge from the hospital from UTI and right-sided pneumonia, with episode of  dyspnea and possibly some confusion during the episode of dyspnea. She is currently breathing fine although she does have some mild rhonchi and wheezing.  I'm going to try DuoNeb treatment with her.  No confusion now. It sounds like she really exerted herself this morning, typically she would not try to get up on her own. She gets around in a wheelchair and is not really ambulatory otherwise.  She has good care at home including granddaughters that are there as well as a caregiver.  I am adding on troponin, and after DuoNeb will reassess, but suspect she will be okay for discharge home.  Her white blood cell count is a little elevated, up from what it was in the hospital.   Her x-ray is not any worse, and her O2 sat is 98% on room air.  However, she does however have some irregularity to her heart rhythm, as well as heart rate goes above 100 and really becomes dyspneic even when she moves around in the bed. I discussed with her daughter which is her caregiver and states that she is really not at baseline. There is some concern that she is not improving from the pneumonia standpoint, and I am going to discuss with hospitalist for admission. I'm going to broaden the coverage, and also cover her with vancomycin in  addition to the Rocephin she received earlier.     CONSULTATIONS:   Dr. Boyce Medici, hospitalist for admission.   Patient / Family / Caregiver informed of clinical course, medical decision-making process, and agree with plan. ___________________________________________   FINAL CLINICAL IMPRESSION(S) / ED DIAGNOSES   Final diagnoses:  SOB (shortness of breath)              Note: This dictation was prepared with Dragon dictation. Any transcriptional errors that result from this process are unintentional    Lisa Roca, MD 05/27/17 [phone]

## 2017-05-27 NOTE — ED Notes (Signed)
Pt resting in bed, warm blanket given.  °

## 2017-05-27 NOTE — H&P (Signed)
[place] [facility] [place] NAME: Yolanda Wells    MR#:  [id number]  DATE OF BIRTH:  November 05, 1932  DATE OF ADMISSION:  05/27/2017  PRIMARY CARE PHYSICIAN: [name], MD   REQUESTING/REFERRING PHYSICIAN: Reita Cliche  CHIEF COMPLAINT:   Chief Complaint  Patient presents with  . Shortness of Breath    HISTORY OF PRESENT ILLNESS: Yolanda Wells  is a 83 y.o. female with a known history of Long-term anticoagulant use, collagen vascular disease, deep vein thrombosis, polycythemia vera, bilateral hip replacement, stroke, admitted for community-acquired pneumonia 3 days ago and discharged yesterday-after she was feeling better. She said that at her baseline she is on wheelchair but she is able to transfer herself from bed to chair. Since he went home yesterday she couldn't not transfer herself at the nighttime and in the morning she started feeling short of breath again with some pain in her chest and concerned with this she decided to come back to emergency room. She found some tachypneic but no hypoxia in the emergency room, chest x-ray also appears to be stable but because of patient's complaint of not able to manage herself at home ER physician suggested to watch her in the hospital and make arrangements for her.  PAST MEDICAL HISTORY:   Past Medical History:  Diagnosis Date  . Anticoagulant long-term use   . Arthritis   . Collagen vascular disease ([place])   . Deep venous thrombosis (HCC)    right lower extremity  . Dependent edema   . History of bilateral hip replacements   . History of hysterectomy   . Hypertension   . Polycythemia vera ([place])   . Stroke [facility])    Pt reports she was told she had a stroke in may    PAST SURGICAL HISTORY: Past Surgical History:  Procedure Laterality Date  . ABDOMINAL HYSTERECTOMY    . CARDIAC CATHETERIZATION Right 04/03/2015   Procedure: CENTRAL LINE INSERTION;  Surgeon: Sherri Rad, MD;  Location: [facility] ORS;  Service:  General;  Laterality: Right;  . COLECTOMY WITH COLOSTOMY CREATION/HARTMANN PROCEDURE N/A 04/03/2015   Procedure: COLECTOMY WITH COLOSTOMY CREATION/HARTMANN PROCEDURE;  Surgeon: Sherri Rad, MD;  Location: [facility] ORS;  Service: General;  Laterality: N/A;  . JOINT REPLACEMENT     Left and Right Hip  . PERIPHERAL VASCULAR CATHETERIZATION N/A 04/02/2015   Procedure: Visceral Angiography;  Surgeon: Algernon Huxley, MD;  Location: [facility];  Service: Cardiovascular;  Laterality: N/A;  . PERIPHERAL VASCULAR CATHETERIZATION N/A 04/02/2015   Procedure: Visceral Artery Intervention;  Surgeon: Algernon Huxley, MD;  Location: [facility];  Service: Cardiovascular;  Laterality: N/A;  . PERIPHERAL VASCULAR CATHETERIZATION N/A 05/25/2015   Procedure: IVC Filter Insertion;  Surgeon: Katha Cabal, MD;  Location: [facility];  Service: Cardiovascular;  Laterality: N/A;  . TOE AMPUTATION     right    SOCIAL HISTORY:  Social History  Substance Use Topics  . Smoking status: Never Smoker  . Smokeless tobacco: Never Used  . Alcohol use No    FAMILY HISTORY:  Family History  Problem Relation Age of Onset  . Heart attack Father   . COPD Father   . Hypertension Unknown   . Arthritis-Osteo Unknown   . Diabetes Brother   . Osteosarcoma Son     DRUG ALLERGIES:  Allergies  Allergen Reactions  . Sulfamethoxazole-Trimethoprim Nausea And Vomiting    REVIEW OF SYSTEMS:   CONSTITUTIONAL: No fever,Positive for fatigue  or weakness.  EYES: No blurred or double vision.  EARS, NOSE, AND THROAT: No tinnitus or ear pain.  RESPIRATORY: No cough, positive for shortness of breath, no wheezing or hemoptysis.  CARDIOVASCULAR: No chest pain, orthopnea, edema.  GASTROINTESTINAL: No nausea, vomiting, diarrhea or abdominal pain.  GENITOURINARY: No dysuria, hematuria.  ENDOCRINE: No polyuria, nocturia,  HEMATOLOGY: No anemia, easy bruising or bleeding SKIN: No rash or lesion. MUSCULOSKELETAL: No  joint pain or arthritis.   NEUROLOGIC: No tingling, numbness, weakness.  PSYCHIATRY: No anxiety or depression.   MEDICATIONS AT HOME:  Prior to Admission medications   Medication Sig Start Date End Date Taking? Authorizing Provider  amLODipine (NORVASC) 5 MG tablet Take 1 tablet (5 mg total) by mouth daily. 05/26/17 05/26/18 Yes [name], MD  aspirin EC 81 MG tablet Take 1 tablet (81 mg total) by mouth daily. 09/16/15  Yes [name], MD  cefUROXime (CEFTIN) 500 MG tablet Take 1 tablet (500 mg total) by mouth 2 (two) times daily with a meal. X 6 more days 05/26/17  Yes [name], MD  docusate sodium (COLACE) 100 MG capsule Take 100 mg by mouth 2 (two) times daily.    Yes [provider]  folic acid (FOLVITE) 893 MCG tablet Take 400 mcg by mouth daily.   Yes [provider]  hydroxyurea (HYDREA) 500 MG capsule Take  4 tablets daily Patient taking differently: Take 1,000 mg by mouth daily. Take  4 tablets daily 04/21/17  Yes Corcoran, Melissa C, MD  metoprolol tartrate (LOPRESSOR) 25 MG tablet Take 12.5 mg by mouth daily.    Yes [provider]  Polyethylene Glycol POWD Take 17 g by mouth every other day. 05/26/17  Yes [name], MD  potassium chloride SA (K-DUR,KLOR-CON) 20 MEQ tablet Take 1 tablet (20 mEq total) by mouth daily. 05/26/17  Yes [name], MD  warfarin (COUMADIN) 4 MG tablet Take 4 mg by mouth as directed. Tuesday, Thursday, Saturday and Sunday    Yes [provider]  warfarin (COUMADIN) 5 MG tablet Take 1 tablet (5 mg total) by mouth daily. Patient taking differently: Take 5 mg by mouth 3 (three) times a week. Monday, Wednesday, Friday 03/13/17  Yes Corcoran, Drue Second, MD      PHYSICAL EXAMINATION:   VITAL SIGNS: Blood pressure 140/70, pulse 99, temperature 98.6 F (37 C), temperature source Oral, resp. rate (!) 21, height 5\' 6"  (1.676 m), weight 94.8 kg (209 lb), SpO2 95 %.  GENERAL:  82 y.o.-year-old  patient lying in the bed with no acute distress.  EYES: Pupils equal, round, reactive to light and accommodation. No scleral icterus. Extraocular muscles intact.  HEENT: Head atraumatic, normocephalic. Oropharynx and nasopharynx clear.  NECK:  Supple, no jugular venous distention. No thyroid enlargement, no tenderness.  LUNGS: Normal breath sounds bilaterally, Some wheezing, Bilateral crepitation. No use of accessory muscles of respiration.  CARDIOVASCULAR: S1, S2 normal. No murmurs, rubs, or gallops.  ABDOMEN: Soft, nontender, nondistended. Bowel sounds present. No organomegaly or mass.  EXTREMITIES: No pedal edema, cyanosis, or clubbing.  NEUROLOGIC: Cranial nerves II through XII are intact. Muscle strength 4/5 in upper extremities, 2/5 in lower extremities. Sensation intact. Gait not checked.  PSYCHIATRIC: The patient is alert and oriented x 3.  SKIN: No obvious rash, lesion, or ulcer.   LABORATORY PANEL:   CBC  Recent Labs Lab 05/23/17 1922 05/24/17 0343 05/25/17 0423 05/26/17 0457 05/27/17 1135  WBC 10.8 9.4 10.8 10.5 13.6*  HGB 14.0 13.1 12.9 12.9  13.8  HCT 42.5 39.1 39.5 38.5 41.0  PLT 1,234* 1,110* 1,155* 1,213* 1,540*  MCV 90.2 90.8 89.6 90.7 89.5  MCH 29.6 30.5 29.3 30.3 30.2  MCHC 32.8 33.6 32.7 33.4 33.8  RDW 37.2* 33.2* 33.5* 35.8* 36.6*  LYMPHSABS 1.5  --   --   --   --   MONOABS 0.8  --   --   --   --   EOSABS 0.1  --   --   --   --   BASOSABS 0.1  --   --   --   --    ------------------------------------------------------------------------------------------------------------------  Chemistries   Recent Labs Lab 05/23/17 1922 05/24/17 0343 05/26/17 0457 05/27/17 1135  NA 137 138 139 141  K 4.4 4.0 3.7 4.0  CL 105 109 108 108  CO2 25 25 26 25   GLUCOSE 118* 104* 101* 104*  BUN 12 11 12 9   CREATININE 0.67 0.55 0.61 0.62  CALCIUM 8.5* 8.3* 8.3* 8.9  AST 22  --   --  18  ALT 9*  --   --  10*  ALKPHOS 81  --   --  76  BILITOT 0.9  --   --  0.7    ------------------------------------------------------------------------------------------------------------------ estimated creatinine clearance is 61.8 mL/min (by C-G formula based on SCr of 0.62 mg/dL). ------------------------------------------------------------------------------------------------------------------ No results for input(s): TSH, T4TOTAL, T3FREE, THYROIDAB in the last 72 hours.  Invalid input(s): FREET3   Coagulation profile  Recent Labs Lab 05/23/17 2133 05/25/17 0423 05/26/17 0457 05/27/17 1135  INR 2.19 2.30 2.61 2.37   ------------------------------------------------------------------------------------------------------------------- No results for input(s): DDIMER in the last 72 hours. -------------------------------------------------------------------------------------------------------------------  Cardiac Enzymes  Recent Labs Lab 05/27/17 1135  TROPONINI <0.03   ------------------------------------------------------------------------------------------------------------------ Invalid input(s): POCBNP  ---------------------------------------------------------------------------------------------------------------  Urinalysis    Component Value Date/Time   COLORURINE YELLOW (A) 05/23/2017 1937   APPEARANCEUR CLOUDY (A) 05/23/2017 1937   APPEARANCEUR Hazy 07/08/2014 0956   LABSPEC 1.014 05/23/2017 1937   LABSPEC 1.019 07/08/2014 0956   PHURINE 5.0 05/23/2017 1937   GLUCOSEU NEGATIVE 05/23/2017 1937   GLUCOSEU Negative 07/08/2014 0956   HGBUR SMALL (A) 05/23/2017 1937   BILIRUBINUR NEGATIVE 05/23/2017 1937   BILIRUBINUR Negative 07/08/2014 0956   KETONESUR NEGATIVE 05/23/2017 1937   PROTEINUR NEGATIVE 05/23/2017 1937   NITRITE POSITIVE (A) 05/23/2017 1937   LEUKOCYTESUR LARGE (A) 05/23/2017 1937   LEUKOCYTESUR 1+ 07/08/2014 0956     RADIOLOGY: Dg Chest 2 View  Result Date: 05/27/2017 CLINICAL DATA:  Followup pneumonia.  Shortness of  breath and cough. EXAM: CHEST  2 VIEW COMPARISON:  05/23/2017 FINDINGS: Cardiomegaly again demonstrated. Aortic atherosclerosis with tortuosity again demonstrated. Abnormal density in the right lower lobe consistent with pneumonia and volume loss. This is largely chronic, similar to the studies of January. No visible effusion. Chronic degenerative changes affect the spine and shoulders. IMPRESSION: Chronic volume loss and infiltrate in the right lower lobe. Similar to the previous studies. Cardiomegaly and aortic atherosclerosis. Electronically Signed   By: Nelson Chimes M.D.   On: 05/27/2017 11:57    EKG: Orders placed or performed during the hospital encounter of 05/27/17  . EKG 12-Lead  . EKG 12-Lead    IMPRESSION AND PLAN:  * Dyspnea   Community-acquired pneumonia   Was treated with Rocephin and azithromycin and was feeling better yesterday so I will resume that.   Currently she is not hypoxic. She has some wheezing so I will give DuoNeb nebulizer treatment around the clock.  * Generalized weakness  I will get physical therapy evaluation for assessment.  * Hypertension   Continue home medications.  * Thrombocytosis- history of DVT   Has JAK 2 mutation.   Takes Coumadin at home, INR is therapeutic.  * History of stroke   Continue aspirin, she is wheelchair bound at her baseline, we will get physical therapy evaluation.  All the records are reviewed and case discussed with ED provider. Management plans discussed with the patient, family and they are in agreement.  CODE STATUS: full. Code Status History    Date Active Date Inactive Code Status Order ID Comments User Context   05/23/2017 11:22 PM 05/26/2017  4:14 PM Full Code [id number]  Lance Coon, MD Inpatient   04/10/2017  3:25 AM 04/11/2017  7:16 PM Full Code [id number]  Harrie Foreman, MD Inpatient   11/23/2016  6:29 AM 12/01/2016  7:35 PM Full Code [id number]  Harrie Foreman, MD Inpatient   08/26/2016  4:45 AM 08/26/2016   7:23 PM Full Code [id number]  Saundra Shelling, MD Inpatient   09/22/2015  5:55 PM 09/25/2015  6:44 PM Full Code [id number]  Demetrios Loll, MD ED   09/15/2015  3:41 AM 09/17/2015  5:41 PM Full Code [id number]  Harrie Foreman, MD Inpatient   07/16/2015  1:51 AM 07/20/2015 11:34 PM Full Code [id number]  Lance Coon, MD Inpatient   05/25/2015  8:34 AM 05/29/2015  4:16 PM Full Code [id number]  Harrie Foreman, MD Inpatient   04/02/2015  6:06 AM 04/08/2015  4:51 PM Full Code [id number]  Harrie Foreman, MD Inpatient    Advance Directive Documentation     Most Recent Value  Type of Advance Directive  Healthcare Power of Ages, Living will  Pre-existing out of facility DNR order (yellow form or pink MOST form)  -  "MOST" Form in Place?  -       TOTAL TIME TAKING CARE OF THIS PATIENT: 45 minutes.    Vaughan Basta M.D on 05/27/2017   Between 7am to 6pm - Pager - [phone]  After 6pm go to [email or web address] - password [email or web address] [facility]  Office  [phone]  CC: Primary care physician; [name], MD   Note: This dictation was prepared with Dragon dictation along with smaller phrase technology. Any transcriptional errors that result from this process are unintentional.

## 2017-05-27 NOTE — ED Notes (Signed)
Lab contacted to draw PT/INR due to pt difficult stick. Lab will come draw.

## 2017-05-28 ENCOUNTER — Encounter: Payer: Self-pay | Admitting: Hematology and Oncology

## 2017-05-28 DIAGNOSIS — R0600 Dyspnea, unspecified: Secondary | ICD-10-CM | POA: Diagnosis not present

## 2017-05-28 DIAGNOSIS — R0602 Shortness of breath: Secondary | ICD-10-CM | POA: Diagnosis not present

## 2017-05-28 LAB — BASIC METABOLIC PANEL
ANION GAP: 3 — AB (ref 5–15)
BUN: 10 mg/dL (ref 6–20)
CALCIUM: 8.2 mg/dL — AB (ref 8.9–10.3)
CHLORIDE: 110 mmol/L (ref 101–111)
CO2: 27 mmol/L (ref 22–32)
Creatinine, Ser: 0.54 mg/dL (ref 0.44–1.00)
GFR calc non Af Amer: >60 mL/min (ref >60)
Glucose, Bld: 104 mg/dL — ABNORMAL HIGH (ref 65–99)
Potassium: 4.1 mmol/L (ref 3.5–5.1)
SODIUM: 140 mmol/L (ref 135–145)

## 2017-05-28 LAB — CBC
HCT: 36.7 % (ref 35.0–47.0)
Hemoglobin: 12 g/dL (ref 12.0–16.0)
MCH: 29.6 pg (ref 26.0–34.0)
MCHC: 32.7 g/dL (ref 32.0–36.0)
MCV: 90.5 fL (ref 80.0–100.0)
PLATELETS: 1382 10*3/uL — AB (ref 150–440)
RBC: 4.06 MIL/uL (ref 3.80–5.20)
RDW: 36.2 % — ABNORMAL HIGH (ref 11.5–14.5)
WBC: 13.2 10*3/uL — AB (ref 3.6–11.0)

## 2017-05-28 LAB — CULTURE, BLOOD (ROUTINE X 2)
CULTURE: NO GROWTH
Culture: NO GROWTH
SPECIAL REQUESTS: ADEQUATE
Special Requests: ADEQUATE

## 2017-05-28 LAB — PROTIME-INR
INR: 2.5
PROTHROMBIN TIME: 27.5 s — AB (ref 11.4–15.2)

## 2017-05-28 MED ORDER — CEFUROXIME AXETIL 500 MG PO TABS
500.0000 mg | ORAL_TABLET | Freq: Two times a day (BID) | ORAL | Status: DC
  Administered 2017-05-28: 500 mg via ORAL
  Filled 2017-05-28 (×2): qty 1

## 2017-05-28 NOTE — Progress Notes (Signed)
EMS called for transport home to [place], [place]. Family aware of patient's discharge. Discharge instructions in packet.

## 2017-05-28 NOTE — Discharge Summary (Signed)
[place] [facility] [place] NAME: Yolanda Wells    MR#:  [id number]  DATE OF BIRTH:  1932-05-28  DATE OF ADMISSION:  05/27/2017 ADMITTING PHYSICIAN: Vaughan Basta, MD  DATE OF DISCHARGE: 05/28/17  PRIMARY CARE PHYSICIAN: [name], MD    ADMISSION DIAGNOSIS:  SOB (shortness of breath) [R06.02]  DISCHARGE DIAGNOSIS:  Chronic intermittent shortness of breath--- workup so far negative History of DVT on Coumadin INR therapeutic Right lower lobe atelectasis chronic History of polycythemia vera  SECONDARY DIAGNOSIS:   Past Medical History:  Diagnosis Date  . Anticoagulant long-term use   . Arthritis   . Collagen vascular disease ([place])   . Deep venous thrombosis (HCC)    right lower extremity  . Dependent edema   . History of bilateral hip replacements   . History of hysterectomy   . Hypertension   . Polycythemia vera ([place])   . Stroke [facility])    Pt reports she was told she had a stroke in may    HOSPITAL COURSE:  Yolanda Wells  is a 83 y.o. female with a known history of Long-term anticoagulant use, collagen vascular disease, deep vein thrombosis, polycythemia vera, bilateral hip replacement, stroke, admitted for community-acquired pneumonia 3 days ago and discharged yesterday-after she was feeling better. She said that at her baseline she is on wheelchair but she is able to transfer herself from bed to chair  * Dyspnea appears chronic intermittent -Patient has tendency to weight breathing with her mouth open and talk at the same time which is causing her to become short-winded -Sats have been more than 92% on room air. She is not in respiratory distress. Clinically does not appear to have pneumonia -She is currently being treated with Ceftin from previous admission which will be continued -I reviewed her CT scan from January and couple x-rays thereafter which has shown persistent right lower lobe atelectasis. This was  discussed with patient's granddaughter Madolyn Frieze. -Patient has history of DVT already on Coumadin with therapeutic INR. Low probability for PE -Echo shows EF of 60-65%. No valvular heart disease noted.  * Generalized weakness -Physical therapy had just seen her couple days ago. Recommended home PT which will be resumed  * Hypertension   Continue home medications.  * Thrombocytosis- history of DVT   Has JAK 2 mutation.   Takes Coumadin at home, INR is therapeutic.  * History of stroke   Continue aspirin, she is wheelchair bound at her baseline  Discussed with care management. Patient will discharge to home. She appears to be at baseline.  CONSULTS OBTAINED:    DRUG ALLERGIES:   Allergies  Allergen Reactions  . Sulfamethoxazole-Trimethoprim Nausea And Vomiting    DISCHARGE MEDICATIONS:   Current Discharge Medication List    CONTINUE these medications which have NOT CHANGED   Details  amLODipine (NORVASC) 5 MG tablet Take 1 tablet (5 mg total) by mouth daily. Qty: 30 tablet, Refills: 11    aspirin EC 81 MG tablet Take 1 tablet (81 mg total) by mouth daily. Qty: 30 tablet, Refills: 2    cefUROXime (CEFTIN) 500 MG tablet Take 1 tablet (500 mg total) by mouth 2 (two) times daily with a meal. X 6 more days Qty: 12 tablet, Refills: 0    docusate sodium (COLACE) 100 MG capsule Take 100 mg by mouth 2 (two) times daily.     folic acid (FOLVITE) 989 MCG tablet Take 400 mcg by mouth daily.  hydroxyurea (HYDREA) 500 MG capsule Take  4 tablets daily Qty: 120 capsule, Refills: 1   Associated Diagnoses: Polycythemia vera (HCC)    metoprolol tartrate (LOPRESSOR) 25 MG tablet Take 12.5 mg by mouth daily.     Polyethylene Glycol POWD Take 17 g by mouth every other day. Qty: 100 g, Refills: 0    potassium chloride SA (K-DUR,KLOR-CON) 20 MEQ tablet Take 1 tablet (20 mEq total) by mouth daily. Qty: 30 tablet, Refills: 0    !! warfarin (COUMADIN) 4 MG tablet Take 4 mg  by mouth as directed. Tuesday, Thursday, Saturday and Sunday     !! warfarin (COUMADIN) 5 MG tablet Take 1 tablet (5 mg total) by mouth daily. Qty: 30 tablet, Refills: 3   Associated Diagnoses: Chronic deep vein thrombosis (DVT) of lower extremity, unspecified laterality, unspecified vein (HCC)     !! - Potential duplicate medications found. Please discuss with provider.      If you experience worsening of your admission symptoms, develop shortness of breath, life threatening emergency, suicidal or homicidal thoughts you must seek medical attention immediately by calling 911 or calling your MD immediately  if symptoms less severe.  You Must read complete instructions/literature along with all the possible adverse reactions/side effects for all the Medicines you take and that have been prescribed to you. Take any new Medicines after you have completely understood and accept all the possible adverse reactions/side effects.   Please note  You were cared for by a hospitalist during your hospital stay. If you have any questions about your discharge medications or the care you received while you were in the hospital after you are discharged, you can call the unit and asked to speak with the hospitalist on call if the hospitalist that took care of you is not available. Once you are discharged, your primary care physician will handle any further medical issues. Please note that NO REFILLS for any discharge medications will be authorized once you are discharged, as it is imperative that you return to your primary care physician (or establish a relationship with a primary care physician if you do not have one) for your aftercare needs so that they can reassess your need for medications and monitor your lab values. Today   SUBJECTIVE   Doing well. Came in yesterday with some shortness of breath. Patient asymptomatic at present.  VITAL SIGNS:  Blood pressure (!) 151/71, pulse (!) 102, temperature 98.1 F  (36.7 C), temperature source Oral, resp. rate 20, height 5\' 7" (1.702 m), weight 93.6 kg (206 lb 6.4 oz), SpO2 98 %.  I/O:   Intake/Output Summary (Last 24 hours) at 05/28/17 0953 Last data filed at 05/28/17 0216  Gross per 24 hour  Intake              125 ml  Output                0 ml  Net              12 5 ml    PHYSICAL EXAMINATION:  GENERAL:  83 y.o.-year-old patient lying in the bed with no acute distress. Obese EYES: Pupils equal, round, reactive to light and accommodation. No scleral icterus. Extraocular muscles intact.  HEENT: Head atraumatic, normocephalic. Oropharynx and nasopharynx clear.  NECK:  Supple, no jugular venous distention. No thyroid enlargement, no tenderness.  LUNGS: Normal breath sounds bilaterally, no wheezing, rales,rhonchi or crepitation. No use of accessory muscles of respiration. Breathing with her mouth open. CARDIOVASCULAR:  S1, S2 normal. No murmurs, rubs, or gallops.  ABDOMEN: Soft, non-tender, non-distended. Bowel sounds present. No organomegaly or mass.  EXTREMITIES: No pedal edema, cyanosis, or clubbing.  NEUROLOGIC: Cranial nerves II through XII are intact. Muscle strength 5/5 in all extremities. Sensation intact. Gait not checked.  PSYCHIATRIC: The patient is alert and oriented x 3.  SKIN: No obvious rash, lesion, or ulcer.   DATA REVIEW:   CBC   Recent Labs Lab 05/28/17 0409  WBC 13.2*  HGB 12.0  HCT 36.7  PLT 1,382*    Chemistries   Recent Labs Lab 05/27/17 1135 05/28/17 0409  NA 141 140  K 4.0 4.1  CL 108 110  CO2 25 27  GLUCOSE 104* 104*  BUN 9 10  CREATININE 0.62 0.54  CALCIUM 8.9 8.2*  AST 18  --   ALT 10*  --   ALKPHOS 76  --   BILITOT 0.7  --     Microbiology Results   Recent Results (from the past 240 hour(s))  Culture, blood (Routine x 2)     Status: None   Collection Time: 05/23/17  7:23 PM  Result Value Ref Range Status   Specimen Description BLOOD RIGHT ANTECUBITAL  Final   Special Requests   Final     BOTTLES DRAWN AEROBIC AND ANAEROBIC Blood Culture adequate volume   Culture NO GROWTH 5 DAYS  Final   Report Status 05/28/2017 FINAL  Final  Culture, blood (Routine x 2)     Status: None   Collection Time: 05/23/17  7:37 PM  Result Value Ref Range Status   Specimen Description BLOOD RIGHT HAND  Final   Special Requests   Final    BOTTLES DRAWN AEROBIC AND ANAEROBIC Blood Culture adequate volume   Culture NO GROWTH 5 DAYS  Final   Report Status 05/28/2017 FINAL  Final    RADIOLOGY:  Dg Chest 2 View  Result Date: 05/27/2017 CLINICAL DATA:  Followup pneumonia.  Shortness of breath and cough. EXAM: CHEST  2 VIEW COMPARISON:  05/23/2017 FINDINGS: Cardiomegaly again demonstrated. Aortic atherosclerosis with tortuosity again demonstrated. Abnormal density in the right lower lobe consistent with pneumonia and volume loss. This is largely chronic, similar to the studies of January. No visible effusion. Chronic degenerative changes affect the spine and shoulders. IMPRESSION: Chronic volume loss and infiltrate in the right lower lobe. Similar to the previous studies. Cardiomegaly and aortic atherosclerosis. Electronically Signed   By: Nelson Chimes M.D.   On: 05/27/2017 11:57     Management plans discussed with the patient, family and they are in agreement.  CODE STATUS:     Code Status Orders        Start     Ordered   05/27/17 1955  Full code  Continuous     05/27/17 1954    Code Status History    Date Active Date Inactive Code Status Order ID Comments User Context   05/23/2017 11:22 PM 05/26/2017  4:14 PM Full Code [id number]  Lance Coon, MD Inpatient   04/10/2017  3:25 AM 04/11/2017  7:16 PM Full Code [id number]  Harrie Foreman, MD Inpatient   11/23/2016  6:29 AM 12/01/2016  7:35 PM Full Code [id number]  Harrie Foreman, MD Inpatient   08/26/2016  4:45 AM 08/26/2016  7:23 PM Full Code [id number]  Saundra Shelling, MD Inpatient   09/22/2015  5:55 PM 09/25/2015  6:44 PM Full Code [id number]   Demetrios Loll, MD ED   09/15/2015  3:41 AM  09/17/2015  5:41 PM Full Code [id number]  Harrie Foreman, MD Inpatient   07/16/2015  1:51 AM 07/20/2015 11:34 PM Full Code [id number]  Lance Coon, MD Inpatient   05/25/2015  8:34 AM 05/29/2015  4:16 PM Full Code [id number]  Harrie Foreman, MD Inpatient   04/02/2015  6:06 AM 04/08/2015  4:51 PM Full Code [id number]  Harrie Foreman, MD Inpatient    Advance Directive Documentation     Most Recent Value  Type of Advance Directive  Healthcare Power of Attorney, Living will  Pre-existing out of facility DNR order (yellow form or pink MOST form)  -  "MOST" Form in Place?  -      TOTAL TIME TAKING CARE OF THIS PATIENT 43minutes.    Fergus Throne M.D on 05/28/2017 at 9:53 AM  Between 7am to 6pm - Pager - [phone] After 6pm go to [email or web address] - password [email or web address] [facility]  Office  [phone]  CC: Primary care physician; [name], MD

## 2017-05-28 NOTE — Consult Note (Addendum)
Yolanda CONSULT NOTE - Initial Consult  Pharmacy Consult for warfarin Indication: hx of DVT JAK2 mutation  Allergies  Allergen Reactions  . Sulfamethoxazole-Trimethoprim Nausea And Vomiting   Patient Measurements: Height: 5\' 7"  (170.2 cm) Weight: 206 lb 6.4 oz (93.6 kg) IBW/kg (Calculated) : 61.6  Vital Signs: Temp: 98.1 F (36.7 C) (07/19 0404) Temp Source: Oral (07/19 0404) BP: 125/73 (07/19 0404) Pulse Rate: 94 (07/19 0404)  Labs:  Recent Labs  05/26/17 0457 05/27/17 1135 05/28/17 0409  HGB 12.9 13.8 12.0  HCT 38.5 41.0 36.7  PLT 1,213* 1,540* 1,382*  LABPROT 28.4* 26.3* 27.5*  INR 2.61 2.37 2.50  CREATININE 0.61 0.62 0.54  TROPONINI  --  <0.03  --     Estimated Creatinine Clearance: 62.6 mL/min (by C-G formula based on SCr of 0.54 mg/dL).   Medical History: Past Medical History:  Diagnosis Date  . Anticoagulant long-term use   . Arthritis   . Collagen vascular disease ([place])   . Deep venous thrombosis (HCC)    right lower extremity  . Dependent edema   . History of bilateral hip replacements   . History of hysterectomy   . Hypertension   . Polycythemia vera ([place])   . Stroke [facility])    Pt reports she was told she had a stroke in may    Medications:  Scheduled:  . amLODipine  5 mg Oral Daily  . aspirin EC  81 mg Oral Daily  . cefUROXime  500 mg Oral BID WC  . docusate sodium  100 mg Oral BID  . folic acid  173 mcg Oral Daily  . hydroxyurea  1,000 mg Oral Daily  . ipratropium-albuterol  3 mL Nebulization Q4H  . metoprolol tartrate  12.5 mg Oral Daily  . polyethylene glycol  17 g Oral QODAY  . potassium chloride SA  20 mEq Oral Daily  . warfarin  4 mg Oral Once per day on Sun Tue Thu Sat  . [START ON 05/29/2017] warfarin  5 mg Oral Once per day on Mon Wed Fri  . Warfarin - Pharmacist Dosing Inpatient   Does not apply q1800    Assessment: Pt is a 83 year old female who presents after being d/c yesterday with SOB.  Pt has a hx of DVT w/  JAK2+ polycythema vera.  Home dose of warfarin is 5 mg M, W, F; 4 mg T, Th, Sa, Su. INR is therapeutic on admission.  Goal of Therapy:  INR 2-3 Monitor platelets by Yolanda protocol: Yes   Plan:  INR = 2.5 is therapeutic today.  Continue home dose of 5mg  MWF and 4mg  TThSatSun. INR daily  Lenis Noon, Pharm.D, BCPS Clinical Pharmacist 05/28/2017,8[place]06 AM

## 2017-05-28 NOTE — Care Management Note (Signed)
Case Management Note  Patient Details  Name: [name] MRN: [id number] Date of Birth: July 06, 1932  Subjective/Objective:      Admitted to [facility] with the diagnosis of shortness of breathe under observation status. Discharged from this facility 05/26/17. Last seen Dr. Janene Harvey 2-3 weeks ago. Lives with granddaughter, [name]. Followed by [facility] for skilled nursing and physical therapy. [employer], [facility], and [facility] in the past. States she has a caregiver that comes from 9:00am-11:00am Monday -Friday. No home oxygen. Hospital bed, wheelchair, rolling walker and nebulizer in the home. Prescriptions are filled at [facility]. No falls. Good appetite. Transportation per EMS              Action/Plan:  Home with resumption of services Skilled nursing.  Physical therapy evaluation pending   Expected Discharge Date:                  Expected Discharge Plan:     In-House Referral:     Discharge planning Services     Post Acute Care Choice:    Choice offered to:     DME Arranged:    DME Agency:     HH Arranged:    HH Agency:     Status of Service:     If discussed at [employer], dates discussed:    Additional Comments: Will continue to follow  Shelbie Ammons, RN MSN [place] [facility] [phone] 05/28/2017, 9:02 AM

## 2017-05-28 NOTE — Care Management (Addendum)
Discharge to home today per Dr. Posey Pronto. [place], [facility] representative updated. Transportation per EMS. Will discuss with granddaughter. Shelbie Ammons RN MSN CCM Care Management [phone]

## 2017-05-28 NOTE — Care Management Obs Status (Signed)
[place] NOTIFICATION   Patient Details  Name: Yolanda Wells MRN: [id number] Date of Birth: [birth year]   Medicare Observation Status Notification Given:  Yes    Shelbie Ammons, RN 05/28/2017, 9:09 AM

## 2017-07-07 ENCOUNTER — Encounter
Admission: RE | Admit: 2017-07-07 | Discharge: 2017-07-07 | Disposition: A | Discharge status: Home / Self Care | Payer: Medicare Other | Source: Ambulatory Visit | Attending: Internal Medicine | Admitting: Internal Medicine

## 2017-07-08 DIAGNOSIS — F0150 Vascular dementia without behavioral disturbance: Secondary | ICD-10-CM | POA: Insufficient documentation

## 2017-07-11 ENCOUNTER — Encounter
Admission: RE | Admit: 2017-07-11 | Discharge: 2017-07-11 | Disposition: A | Discharge status: Home / Self Care | Payer: Medicare Other | Source: Ambulatory Visit | Attending: Internal Medicine | Admitting: Internal Medicine

## 2017-07-14 LAB — PROTIME-INR
INR: 2.35
Prothrombin Time: 25.5 seconds — ABNORMAL HIGH (ref 11.4–15.2)

## 2017-07-16 ENCOUNTER — Encounter: Payer: Self-pay | Admitting: Gerontology

## 2017-07-16 ENCOUNTER — Non-Acute Institutional Stay (SKILLED_NURSING_FACILITY): Payer: Medicare Other | Admitting: Gerontology

## 2017-07-16 DIAGNOSIS — R0602 Shortness of breath: Secondary | ICD-10-CM

## 2017-07-16 DIAGNOSIS — I5033 Acute on chronic diastolic (congestive) heart failure: Secondary | ICD-10-CM

## 2017-07-16 NOTE — Progress Notes (Signed)
Location:   [facility] [place] [facility] [place] Room Number: 201B Place of Service:  SNF [id number]) Provider:  Toni Arthurs, NP-C  [name], MD  Patient Care Team: [name], MD as PCP - General (Pediatrics)  Extended Emergency Contact Information Primary Emergency Contact: [facility] Address: [place], [place] [address] Johnnette Litter of [place] Phone: [phone] Mobile Phone: [phone] Relation: Relative  Code Status:  FULL Goals of care: Advanced Directive information Advanced Directives 07/16/2017  Does Patient Have a Medical Advance Directive? No  Type of Advance Directive -  Does patient want to make changes to medical advance directive? -  Copy of [place] in Chart? -  Would patient like information on creating a medical advance directive? -     Chief Complaint  Patient presents with  . Hospitalization Follow-up    Follow up on CHF    HPI:  Pt is a 83 y.o. female seen today for a hospital f/u s/p admission from [facility] for dyspnea. Pt has generalized weakness and deconditioning. She also an element of confusion r/t dementia. Unable to obtain complete ROS d/t limited recall/verbal ability- intermittent. Pt has been working with PT/OT. She has not been eating well- doesn't like the food. Family encouraged to bring her favorite foods in. Pt does deny n/v/d/f/c/cp/sob/ha/abd pain/dizziness/cough. VSS. No other complaints.   Past Medical History:  Diagnosis Date  . Acute deep vein thrombosis (DVT) of distal vein of left lower extremity ([place]) 10/03/2015  . Acute pulmonary embolism ([place]) 10/03/2015  . Anticoagulant long-term use   . Arthritis   . Chronic anticoagulation 10/03/2015  . Collagen vascular disease ([place])   . Colostomy in place [facility]) 10/04/2015  . Deep venous thrombosis (HCC)    right lower extremity  . Dependent edema   . Diverticulosis of intestine with bleeding 04/02/2015  .  Glenohumeral arthritis 06/28/2012  . Hammertoe 09/02/2012  . History of bilateral hip replacements 09/02/2012  . History of hysterectomy   . Hypertension   . Hypokalemia   . Inability to ambulate due to hip 10/12/2015  . Mandibular dysfunction   . Onychomycosis 09/02/2012  . Osteoarthritis   . Polycythemia vera ([place])   . Presence of IVC filter 05/25/2015  . Stroke ([place]) 03/26/2015   cerebellar  . Urinary incontinence in female 09/02/2016   Past Surgical History:  Procedure Laterality Date  . ABDOMINAL HYSTERECTOMY    . BLEPHAROPLASTY Right 03/2017   right upper eyelid  . CARDIAC CATHETERIZATION Right 04/03/2015   Procedure: CENTRAL LINE INSERTION;  Surgeon: Sherri Rad, MD;  Location: [facility] ORS;  Service: General;  Laterality: Right;  . COLECTOMY WITH COLOSTOMY CREATION/HARTMANN PROCEDURE N/A 04/03/2015   Procedure: COLECTOMY WITH COLOSTOMY CREATION/HARTMANN PROCEDURE;  Surgeon: Sherri Rad, MD;  Location: [facility] ORS;  Service: General;  Laterality: N/A;  . FOOT AMPUTATION     partial  . FOOT AMPUTATION Right   . JOINT REPLACEMENT     Left and Right Hip  . PERIPHERAL VASCULAR CATHETERIZATION N/A 04/02/2015   Procedure: Visceral Angiography;  Surgeon: Algernon Huxley, MD;  Location: [place] CV LAB;  Service: Cardiovascular;  Laterality: N/A;  . PERIPHERAL VASCULAR CATHETERIZATION N/A 04/02/2015   Procedure: Visceral Artery Intervention;  Surgeon: Algernon Huxley, MD;  Location: [place] CV LAB;  Service: Cardiovascular;  Laterality: N/A;  . PERIPHERAL VASCULAR CATHETERIZATION N/A 05/25/2015   Procedure: IVC Filter Insertion;  Surgeon: Katha Cabal, MD;  Location: [place] CV LAB;  Service: Cardiovascular;  Laterality: N/A;  . TOE AMPUTATION     right  . TOTAL HIP ARTHROPLASTY      Allergies  Allergen Reactions  . Sulfamethoxazole-Trimethoprim Nausea And Vomiting    Allergies as of 07/16/2017      Reactions   Sulfamethoxazole-trimethoprim Nausea And Vomiting        Medication List       Accurate as of 07/16/17 11:59 PM. Always use your most recent med list.          amLODipine 5 MG tablet Commonly known as:  NORVASC Take 1 tablet (5 mg total) by mouth daily.   aspirin EC 81 MG tablet Take 1 tablet (81 mg total) by mouth daily.   ENSURE Take 1 Can by mouth 3 (three) times daily with meals.   folic acid 768 MCG tablet Commonly known as:  FOLVITE Take 400 mcg by mouth daily.   furosemide 20 MG tablet Commonly known as:  LASIX Take 20 mg by mouth daily.   hydroxyurea 500 MG capsule Commonly known as:  HYDREA Take 1,500 mg by mouth See admin instructions. Take daily on Mon, Wed, Friday a 8 am. 3 capsules = 1500 mg May take with food to minimize GI side effects.   hydroxyurea 500 MG capsule Commonly known as:  HYDREA Take 2,000 mg by mouth See admin instructions. Take daily 4 caps = 2000 mg on Sunday, Tuesday, Thursday and Saturday. May take with food to minimize GI side effects.   ipratropium-albuterol 0.5-2.5 (3) MG/3ML Soln Commonly known as:  DUONEB Take 3 mLs by nebulization 4 (four) times daily.   metoprolol tartrate 25 MG tablet Commonly known as:  LOPRESSOR Take 25 mg by mouth 2 (two) times daily.   mirtazapine 7.5 MG tablet Commonly known as:  REMERON Take 7.5 mg by mouth at bedtime.   potassium chloride 10 MEQ tablet Commonly known as:  K-DUR,KLOR-CON Take 10 mEq by mouth 2 (two) times a week. On Monday and Thursday  With lasix for potassium replacement   triamcinolone cream 0.1 % Commonly known as:  KENALOG Apply 1 application topically 2 (two) times daily. To affected area   warfarin 4 MG tablet Commonly known as:  COUMADIN Take 4 mg by mouth as directed. Tuesday, Thursday, Saturday and Sunday   warfarin 5 MG tablet Commonly known as:  COUMADIN Take 5 mg by mouth See admin instructions. Once daily on Monday, Wed, and Friday       Review of Systems  Unable to perform ROS: Dementia  Constitutional: Negative  for activity change, appetite change, chills, diaphoresis and fever.  HENT: Negative for congestion, sneezing, sore throat, trouble swallowing and voice change.   Eyes: Negative.   Respiratory: Negative for apnea, cough, choking, chest tightness, shortness of breath and wheezing.   Cardiovascular: Negative for chest pain, palpitations and leg swelling.  Gastrointestinal: Negative for abdominal distention, abdominal pain, constipation, diarrhea and nausea.       Established Colostomy in place  Genitourinary: Negative for difficulty urinating, dysuria, frequency and urgency.  Musculoskeletal: Positive for arthralgias (typical arthritis). Negative for back pain, gait problem and myalgias.  Skin: Negative for color change, pallor, rash and wound.  Neurological: Positive for weakness. Negative for dizziness, tremors, syncope, speech difficulty, numbness and headaches.  Psychiatric/Behavioral: Positive for confusion. Negative for agitation and behavioral problems.  All other systems reviewed and are negative.   Immunization History  Administered Date(s) Administered  . Influenza,inj,Quad PF,6+ Mos  09/25/2015  . Influenza-Unspecified 09/26/2015  . PPD Test 04/08/2015, 04/22/2015  . Pneumococcal Conjugate-13 10/03/2015  . Pneumococcal Polysaccharide-23 02/17/2012   Pertinent  Health Maintenance Due  Topic Date Due  . DEXA SCAN  03/10/1999  . INFLUENZA VACCINE  06/10/2017  . PNA vac Low Risk Adult  Completed   No flowsheet data found. Functional Status Survey:    Vitals:   07/16/17 1018  BP: 120/64  Pulse: 71  Resp: 18  Temp: 98.2 F (36.8 C)  SpO2: 98%  Weight: 201 lb 11.2 oz (91.5 kg)  Height: 5' 7" (1.702 m)   Body mass index is 31.59 kg/m. Physical Exam  Constitutional: She is oriented to person, place, and time. Vital signs are normal. She appears well-developed and well-nourished. She is active and cooperative. She does not appear ill. No distress.  HENT:  Head:  Normocephalic and atraumatic.  Mouth/Throat: Uvula is midline, oropharynx is clear and moist and mucous membranes are normal. Mucous membranes are not pale, not dry and not cyanotic.  Eyes: Pupils are equal, round, and reactive to light. Conjunctivae, EOM and lids are normal.  Neck: Trachea normal, normal range of motion and full passive range of motion without pain. Neck supple. No JVD present. No tracheal deviation, no edema and no erythema present. No thyromegaly present.  Cardiovascular: Normal rate, regular rhythm, normal heart sounds and intact distal pulses.  Exam reveals no gallop, no distant heart sounds and no friction rub.   No murmur heard. Pulses:      Dorsalis pedis pulses are 1+ on the right side, and 1+ on the left side.  Minimal edema  Pulmonary/Chest: Effort normal and breath sounds normal. No accessory muscle usage. No respiratory distress. She has no wheezes. She has no rales. She exhibits no tenderness.  Abdominal: Normal appearance and bowel sounds are normal. She exhibits no distension and no ascites. There is no tenderness.  Musculoskeletal: Normal range of motion. She exhibits no edema or tenderness.  Expected osteoarthritis, stiffness, Right partial foot amputation r/t clot. Calves soft, supple. Negative Homan's sign  Neurological: She is alert and oriented to person, place, and time. She has normal strength.  Skin: Skin is warm, dry and intact. No rash noted. She is not diaphoretic. No cyanosis or erythema. No pallor. Nails show no clubbing.  Psychiatric: She has a normal mood and affect. Her speech is normal and behavior is normal. Judgment and thought content normal. Cognition and memory are normal.  Nursing note and vitals reviewed.   Labs reviewed:  Recent Labs  11/25/16 0420  05/26/17 0457 05/27/17 1135 05/28/17 0409  NA 139  < > 139 141 140  K 4.4  < > 3.7 4.0 4.1  CL 107  < > 108 108 110  CO2 25  < > _0 GLUCOSE 95  < > 101* 104* 104*  BUN 9  <  > _1 CREATININE 0.41*  < > 0.61 0.62 0.54  CALCIUM 8.3*  < > 8.3* 8.9 8.2*  MG 1.8  --   --   --   --   < > = values in this interval not displayed.  Recent Labs  04/09/17 2140 05/23/17 1922 05/27/17 1135  AST _2 ALT 8* 9* 10*  ALKPHOS 74 81 76  BILITOT 0.6 0.9 0.7  PROT 7.3 7.3 7.2  ALBUMIN 3.2* 2.9* 2.8*    Recent Labs  04/02/17 1135  05/08/17 1754 05/23/17 1922  05/26/17 0457  05/27/17 1135 05/28/17 0409  WBC 3.2*  < > 3.2* 10.8  < > 10.5 13.6* 13.2*  NEUTROABS 1.9  --  2.0 8.3*  --   --   --   --   HGB 13.8  < > 13.5 14.0  < > 12.9 13.8 12.0  HCT 40.8  < > 41.1 42.5  < > 38.5 41.0 36.7  MCV 86.3  < > 90.4 90.2  < > 90.7 89.5 90.5  PLT 963*  < > 872* 1,234*  < > 1,213* 1,540* 1,382*  < > = values in this interval not displayed. Lab Results  Component Value Date   TSH 2.553 04/09/2017   Lab Results  Component Value Date   HGBA1C 5.3 04/09/2017   Lab Results  Component Value Date   CHOL 82 08/26/2016   HDL 28 (L) 08/26/2016   LDLCALC 43 08/26/2016   TRIG 53 08/26/2016   CHOLHDL 2.9 08/26/2016    Significant Diagnostic Results in last 30 days:  No results found.  Assessment/Plan 1. Acute on chronic diastolic congestive heart failure (HCC)  Continue lasix 20 mg po Q Day  Continue K-Dur 20 mEq twice weekly  2. SOB (shortness of breath)  Continue Duonebs QID  Family/ staff Communication:   Total Time:  Documentation:  Face to Face:  Family/Phone:   Labs/tests ordered:  Cbc, met c, Vit D, B12, TSH, UA, C&S, INR  Vikki Ports, NP-C [facility] [address], [place] [address] Cell Phone (Mon-Fri 8am-5pm):  [phone] On Call:  [phone] & follow prompts after 5pm & weekends Office Phone:  [phone] Office Fax:  [phone]

## 2017-07-18 ENCOUNTER — Other Ambulatory Visit
Admission: RE | Admit: 2017-07-18 | Discharge: 2017-07-18 | Disposition: A | Discharge status: Home / Self Care | Payer: Medicare Other | Source: Skilled Nursing Facility | Attending: Internal Medicine | Admitting: Internal Medicine

## 2017-07-18 DIAGNOSIS — R410 Disorientation, unspecified: Secondary | ICD-10-CM | POA: Diagnosis present

## 2017-07-18 DIAGNOSIS — R4182 Altered mental status, unspecified: Secondary | ICD-10-CM | POA: Insufficient documentation

## 2017-07-18 LAB — CBC WITH DIFFERENTIAL/PLATELET
Basophils Absolute: 0.1 10*3/uL (ref 0–0.1)
Basophils Relative: 1 %
EOS ABS: 0.1 10*3/uL (ref 0–0.7)
EOS PCT: 1 %
HCT: 38.6 % (ref 35.0–47.0)
Hemoglobin: 12.6 g/dL (ref 12.0–16.0)
LYMPHS ABS: 0.9 10*3/uL — AB (ref 1.0–3.6)
Lymphocytes Relative: 17 %
MCH: 28.6 pg (ref 26.0–34.0)
MCHC: 32.7 g/dL (ref 32.0–36.0)
MCV: 87.6 fL (ref 80.0–100.0)
MONO ABS: 0.1 10*3/uL — AB (ref 0.2–0.9)
MONOS PCT: 1 %
Neutro Abs: 4.5 10*3/uL (ref 1.4–6.5)
Neutrophils Relative %: 80 %
PLATELETS: 535 10*3/uL — AB (ref 150–440)
RBC: 4.41 MIL/uL (ref 3.80–5.20)
RDW: 35.5 % — AB (ref 11.5–14.5)
WBC: 5.6 10*3/uL (ref 3.6–11.0)

## 2017-07-18 LAB — COMPREHENSIVE METABOLIC PANEL
ALT: 7 U/L — ABNORMAL LOW (ref 14–54)
ANION GAP: 5 (ref 5–15)
AST: 16 U/L (ref 15–41)
Albumin: 2.7 g/dL — ABNORMAL LOW (ref 3.5–5.0)
Alkaline Phosphatase: 67 U/L (ref 38–126)
BUN: 8 mg/dL (ref 6–20)
CHLORIDE: 104 mmol/L (ref 101–111)
CO2: 27 mmol/L (ref 22–32)
Calcium: 8.2 mg/dL — ABNORMAL LOW (ref 8.9–10.3)
Creatinine, Ser: 0.49 mg/dL (ref 0.44–1.00)
GFR calc non Af Amer: >60 mL/min (ref >60)
Glucose, Bld: 103 mg/dL — ABNORMAL HIGH (ref 65–99)
POTASSIUM: 4.5 mmol/L (ref 3.5–5.1)
SODIUM: 136 mmol/L (ref 135–145)
Total Bilirubin: 0.6 mg/dL (ref 0.3–1.2)
Total Protein: 6.8 g/dL (ref 6.5–8.1)

## 2017-07-18 LAB — URINALYSIS, COMPLETE (UACMP) WITH MICROSCOPIC
BILIRUBIN URINE: NEGATIVE
Bacteria, UA: NONE SEEN
Glucose, UA: NEGATIVE mg/dL
Ketones, ur: NEGATIVE mg/dL
NITRITE: NEGATIVE
PH: 6 (ref 5.0–8.0)
Protein, ur: NEGATIVE mg/dL
SPECIFIC GRAVITY, URINE: 1.011 (ref 1.005–1.030)

## 2017-07-18 LAB — TSH: TSH: 2.111 u[IU]/mL (ref 0.350–4.500)

## 2017-07-18 LAB — VITAMIN B12: VITAMIN B 12: 268 pg/mL (ref 180–914)

## 2017-07-18 LAB — MAGNESIUM: MAGNESIUM: 1.9 mg/dL (ref 1.7–2.4)

## 2017-07-20 LAB — VITAMIN D 25 HYDROXY (VIT D DEFICIENCY, FRACTURES): Vit D, 25-Hydroxy: 8.9 ng/mL — ABNORMAL LOW (ref 30.0–100.0)

## 2017-07-20 LAB — URINE CULTURE

## 2017-07-21 ENCOUNTER — Other Ambulatory Visit
Admission: RE | Admit: 2017-07-21 | Discharge: 2017-07-21 | Disposition: A | Discharge status: Home / Self Care | Payer: Medicare Other | Source: Ambulatory Visit | Attending: Gerontology | Admitting: Gerontology

## 2017-07-21 DIAGNOSIS — I825Y3 Chronic embolism and thrombosis of unspecified deep veins of proximal lower extremity, bilateral: Secondary | ICD-10-CM | POA: Insufficient documentation

## 2017-07-21 LAB — PROTIME-INR
INR: 3.1
PROTHROMBIN TIME: 31.7 s — AB (ref 11.4–15.2)

## 2017-07-23 ENCOUNTER — Encounter: Payer: Self-pay | Admitting: Gerontology

## 2017-07-23 ENCOUNTER — Non-Acute Institutional Stay (SKILLED_NURSING_FACILITY): Payer: Medicare Other | Admitting: Gerontology

## 2017-07-23 DIAGNOSIS — I5033 Acute on chronic diastolic (congestive) heart failure: Secondary | ICD-10-CM | POA: Diagnosis not present

## 2017-07-23 DIAGNOSIS — R531 Weakness: Secondary | ICD-10-CM

## 2017-07-23 DIAGNOSIS — N3000 Acute cystitis without hematuria: Secondary | ICD-10-CM | POA: Diagnosis not present

## 2017-07-23 DIAGNOSIS — R0602 Shortness of breath: Secondary | ICD-10-CM

## 2017-07-23 NOTE — Progress Notes (Signed)
Location:    [facility] Room Number: [place] of Service:  SNF [phone]) Provider:  Toni Arthurs, NP-C  [name], MD  Patient Care Team: [name], MD as PCP - General (Pediatrics)  Extended Emergency Contact Information Primary Emergency Contact: [facility] Address: [place], [place] [address] Johnnette Litter of [place] Phone: [phone] Mobile Phone: [phone] Relation: Relative  Code Status:  FULL Goals of care: Advanced Directive information Advanced Directives 07/23/2017  Does Patient Have a Medical Advance Directive? No  Type of Advance Directive -  Does patient want to make changes to medical advance directive? -  Copy of [place] in Chart? -  Would patient like information on creating a medical advance directive? -     Chief Complaint  Patient presents with  . Medical Management of Chronic Issues    Routine Visit    HPI:  Pt is a 83 y.o. female seen today for f/u s/p admission from [facility] for dyspnea. Pt has generalized weakness and deconditioning. She also an element of confusion r/t dementia. UA obtained earlier in the week- + UTI with Staph and E-Coli. Both susceptible to Macrobid. Pt has been working with PT/OT. She has not been eating well- doesn't like the food. Family encouraged to bring her favorite foods in. Pt does deny n/v/d/f/c/cp/sob/ha/abd pain/dizziness/cough. VSS. No other complaints.    Past Medical History:  Diagnosis Date  . Acute deep vein thrombosis (DVT) of distal vein of left lower extremity ([place]) 10/03/2015  . Acute pulmonary embolism ([place]) 10/03/2015  . Anticoagulant long-term use   . Arthritis   . Chronic anticoagulation 10/03/2015  . Collagen vascular disease ([place])   . Colostomy in place [facility]) 10/04/2015  . Deep venous thrombosis (HCC)    right lower extremity  . Dependent edema   . Diverticulosis of intestine with bleeding 04/02/2015    . Glenohumeral arthritis 06/28/2012  . Hammertoe 09/02/2012  . History of bilateral hip replacements 09/02/2012  . History of hysterectomy   . Hypertension   . Hypokalemia   . Inability to ambulate due to hip 10/12/2015  . Mandibular dysfunction   . Onychomycosis 09/02/2012  . Osteoarthritis   . Polycythemia vera ([place])   . Presence of IVC filter 05/25/2015  . 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 Urinary incontinence in female 09/02/2016   Past Surgical History:  Procedure Laterality Date  . ABDOMINAL HYSTERECTOMY    . BLEPHAROPLASTY Right 03/2017   right upper eyelid  . CARDIAC CATHETERIZATION Right 04/03/2015   Procedure: CENTRAL LINE INSERTION;  Surgeon: Sherri Rad, MD;  Location: [facility] ORS;  Service: General;  Laterality: Right;  . COLECTOMY WITH COLOSTOMY CREATION/HARTMANN PROCEDURE N/A 04/03/2015   Procedure: COLECTOMY WITH COLOSTOMY CREATION/HARTMANN PROCEDURE;  Surgeon: Sherri Rad, MD;  Location: [facility] ORS;  Service: General;  Laterality: N/A;  . FOOT AMPUTATION     partial  . FOOT AMPUTATION Right   . JOINT REPLACEMENT     Left and Right Hip  . PERIPHERAL VASCULAR CATHETERIZATION N/A 04/02/2015   Procedure: Visceral Angiography;  Surgeon: Algernon Huxley, MD;  Location: [place] CV LAB;  Service: Cardiovascular;  Laterality: N/A;  . PERIPHERAL VASCULAR CATHETERIZATION N/A 04/02/2015   Procedure: Visceral Artery Intervention;  Surgeon: Algernon Huxley, MD;  Location: [place] CV LAB;  Service: Cardiovascular;  Laterality: N/A;  . PERIPHERAL VASCULAR CATHETERIZATION N/A 05/25/2015   Procedure: IVC  Filter Insertion;  Surgeon: Katha Cabal, MD;  Location: [place] CV LAB;  Service: Cardiovascular;  Laterality: N/A;  . TOE AMPUTATION     right  . TOTAL HIP ARTHROPLASTY      Allergies  Allergen Reactions  . Sulfamethoxazole-Trimethoprim Nausea And Vomiting    Allergies as of 07/23/2017      Reactions   Sulfamethoxazole-trimethoprim Nausea And Vomiting       Medication List       Accurate as of 07/23/17  9:31 AM. Always use your most recent med list.          amLODipine 5 MG tablet Commonly known as:  NORVASC Take 1 tablet (5 mg total) by mouth daily.   aspirin EC 81 MG tablet Take 1 tablet (81 mg total) by mouth daily.   cyanocobalamin 1000 MCG tablet Take 2,000 mcg by mouth daily. 2 tabs   docusate sodium 100 MG capsule Commonly known as:  COLACE Take 100 mg by mouth 2 (two) times daily.   ENSURE Take 1 Can by mouth 3 (three) times daily with meals.   feeding supplement (PRO-STAT SUGAR FREE 64) Liqd Take 30 mLs by mouth 2 (two) times daily between meals.   folic acid 846 MCG tablet Commonly known as:  FOLVITE Take 400 mcg by mouth daily.   furosemide 20 MG tablet Commonly known as:  LASIX Take 20 mg by mouth daily. On Monday and Thursday   hydroxyurea 500 MG capsule Commonly known as:  HYDREA Take 1,500 mg by mouth See admin instructions. Take daily on Mon, Wed, Friday a 8 am. 3 capsules = 1500 mg May take with food to minimize GI side effects.   hydroxyurea 500 MG capsule Commonly known as:  HYDREA Take 2,000 mg by mouth See admin instructions. Take daily 4 caps = 2000 mg on Sunday, Tuesday, Thursday and Saturday. May take with food to minimize GI side effects.   ipratropium-albuterol 0.5-2.5 (3) MG/3ML Soln Commonly known as:  DUONEB Take 3 mLs by nebulization 4 (four) times daily.   metoprolol tartrate 25 MG tablet Commonly known as:  LOPRESSOR Take 25 mg by mouth 2 (two) times daily.   mirtazapine 7.5 MG tablet Commonly known as:  REMERON Take 15 mg by mouth at bedtime. 2 tabs   nitrofurantoin (macrocrystal-monohydrate) 100 MG capsule Commonly known as:  MACROBID Take 100 mg by mouth every 12 (twelve) hours.   polyethylene glycol packet Commonly known as:  MIRALAX / GLYCOLAX Take 17 g by mouth daily.   potassium chloride 10 MEQ tablet Commonly known as:  K-DUR,KLOR-CON Take 10 mEq by mouth 2 (two)  times a week. On Monday and Thursday  With lasix for potassium replacement   triamcinolone cream 0.1 % Commonly known as:  KENALOG Apply 1 application topically 2 (two) times daily. To affected area   Vitamin D 2000 units Caps Take 6,000 Units by mouth daily. 3 caps   warfarin 4 MG tablet Commonly known as:  COUMADIN Take 4 mg by mouth daily. At 5 pm       Review of Systems  Unable to perform ROS: Dementia  Constitutional: Negative for activity change, appetite change, chills, diaphoresis and fever.  HENT: Negative for congestion, sneezing, sore throat, trouble swallowing and voice change.   Eyes: Negative.   Respiratory: Negative for apnea, cough, choking, chest tightness, shortness of breath and wheezing.   Cardiovascular: Negative for chest pain, palpitations and leg swelling.  Gastrointestinal: Negative for abdominal distention, abdominal pain, constipation,  diarrhea and nausea.       Established Colostomy in place  Genitourinary: Negative for difficulty urinating, dysuria, frequency and urgency.  Musculoskeletal: Positive for arthralgias (typical arthritis). Negative for back pain, gait problem and myalgias.  Skin: Negative for color change, pallor, rash and wound.  Neurological: Positive for weakness. Negative for dizziness, tremors, syncope, speech difficulty, numbness and headaches.  Psychiatric/Behavioral: Positive for confusion. Negative for agitation and behavioral problems.  All other systems reviewed and are negative.   Immunization History  Administered Date(s) Administered  . Influenza,inj,Quad PF,6+ Mos 09/25/2015  . Influenza-Unspecified 09/26/2015  . PPD Test 04/08/2015, 04/22/2015  . Pneumococcal Conjugate-13 10/03/2015  . Pneumococcal Polysaccharide-23 02/17/2012   Pertinent  Health Maintenance Due  Topic Date Due  . DEXA SCAN  03/10/1999  . INFLUENZA VACCINE  06/10/2017  . PNA vac Low Risk Adult  Completed   No flowsheet data found. Functional  Status Survey:    Vitals:   07/23/17 0917  BP: 132/60  Pulse: 65  Resp: 16  Temp: 98.1 F (36.7 C)  SpO2: 95%  Weight: 202 lb (91.6 kg)  Height: '5\' 7"'$  (1.702 m)   Body mass index is 31.64 kg/m. Physical Exam  Constitutional: She is oriented to person, place, and time. Vital signs are normal. She appears well-developed and well-nourished. She is active and cooperative. She does not appear ill. No distress.  HENT:  Head: Normocephalic and atraumatic.  Mouth/Throat: Uvula is midline, oropharynx is clear and moist and mucous membranes are normal. Mucous membranes are not pale, not dry and not cyanotic.  Eyes: Pupils are equal, round, and reactive to light. Conjunctivae, EOM and lids are normal.  Neck: Trachea normal, normal range of motion and full passive range of motion without pain. Neck supple. No JVD present. No tracheal deviation, no edema and no erythema present. No thyromegaly present.  Cardiovascular: Normal rate, regular rhythm, normal heart sounds, intact distal pulses and normal pulses.  Exam reveals no gallop, no distant heart sounds and no friction rub.   No murmur heard. Minimal edema  Pulmonary/Chest: Effort normal and breath sounds normal. No accessory muscle usage. No respiratory distress. She has no wheezes. She has no rales. She exhibits no tenderness.  Abdominal: Normal appearance and bowel sounds are normal. She exhibits no distension and no ascites. There is no tenderness.  Musculoskeletal: Normal range of motion. She exhibits no edema or tenderness.  Expected osteoarthritis, stiffness  Neurological: She is alert and oriented to person, place, and time. She has normal strength.  Skin: Skin is warm, dry and intact. No rash noted. She is not diaphoretic. No cyanosis or erythema. No pallor. Nails show no clubbing.  Psychiatric: She has a normal mood and affect. Her speech is normal and behavior is normal. Judgment and thought content normal. Cognition and memory are  normal.  Nursing note and vitals reviewed.   Labs reviewed:  Recent Labs  11/25/16 0420  05/27/17 1135 05/28/17 0409 07/18/17 1300  NA 139  < > 141 140 136  K 4.4  < > 4.0 4.1 4.5  CL 107  < > 108 110 104  CO2 25  < > '25 27 27  '$ GLUCOSE 95  < > 104* 104* 103*  BUN 9  < > '9 10 8  '$ CREATININE 0.41*  < > 0.62 0.54 0.49  CALCIUM 8.3*  < > 8.9 8.2* 8.2*  MG 1.8  --   --   --  1.9  < > = values in this  interval not displayed.  Recent Labs  05/23/17 1922 05/27/17 1135 07/18/17 1300  AST '22 18 16  '$ ALT 9* 10* 7*  ALKPHOS 81 76 67  BILITOT 0.9 0.7 0.6  PROT 7.3 7.2 6.8  ALBUMIN 2.9* 2.8* 2.7*    Recent Labs  05/08/17 1754 05/23/17 1922  05/27/17 1135 05/28/17 0409 07/18/17 1300  WBC 3.2* 10.8  < > 13.6* 13.2* 5.6  NEUTROABS 2.0 8.3*  --   --   --  4.5  HGB 13.5 14.0  < > 13.8 12.0 12.6  HCT 41.1 42.5  < > 41.0 36.7 38.6  MCV 90.4 90.2  < > 89.5 90.5 87.6  PLT 872* 1,234*  < > 1,540* 1,382* 535*  < > = values in this interval not displayed. Lab Results  Component Value Date   TSH 2.111 07/18/2017   Lab Results  Component Value Date   HGBA1C 5.3 04/09/2017   Lab Results  Component Value Date   CHOL 82 08/26/2016   HDL 28 (L) 08/26/2016   LDLCALC 43 08/26/2016   TRIG 53 08/26/2016   CHOLHDL 2.9 08/26/2016    Significant Diagnostic Results in last 30 days:  No results found.  Assessment/Plan 1. Acute on chronic diastolic congestive heart failure (HCC)  Continue lasix 20 mg po Q Day  Continue K-Dur 20 mEq twice weekly  2. SOB (shortness of breath)  Continue Duonebs QID  3. Acute cystitis without hematuria  Continue Macrobid 100 mg po BID x 10 days   4. Generalized weakness  Continue working with PT/OT  Continue exercises as taught by PT/OT  Frequent rest breaks if needed  Family/ staff Communication:   Total Time:  Documentation:  Face to Face:  Family/Phone:   Labs/tests ordered:  Cbc, met c, INR  Medication list reviewed and  assessed for continued appropriateness. Monthly medication orders reviewed and signed.  Vikki Ports, NP-C [facility]iatric[facility] [phone], [place] [address] Cell Phone (Mon-Fri 8am-5pm):  [phone] On Call:  [phone] & follow prompts after 5pm & weekends Office Phone:  [phone] Office Fax:  [phone]

## 2017-07-24 ENCOUNTER — Other Ambulatory Visit
Admission: RE | Admit: 2017-07-24 | Discharge: 2017-07-24 | Disposition: A | Discharge status: Home / Self Care | Payer: Medicare Other | Source: Ambulatory Visit | Attending: Gerontology | Admitting: Gerontology

## 2017-07-24 DIAGNOSIS — I825Y3 Chronic embolism and thrombosis of unspecified deep veins of proximal lower extremity, bilateral: Secondary | ICD-10-CM | POA: Diagnosis present

## 2017-07-24 LAB — PROTIME-INR
INR: 3.97
PROTHROMBIN TIME: 38.5 s — AB (ref 11.4–15.2)

## 2017-07-28 ENCOUNTER — Other Ambulatory Visit
Admission: RE | Admit: 2017-07-28 | Discharge: 2017-07-28 | Disposition: A | Discharge status: Home / Self Care | Payer: Medicare Other | Source: Ambulatory Visit | Attending: Gerontology | Admitting: Gerontology

## 2017-07-28 DIAGNOSIS — I5032 Chronic diastolic (congestive) heart failure: Secondary | ICD-10-CM | POA: Insufficient documentation

## 2017-07-28 DIAGNOSIS — I825Y3 Chronic embolism and thrombosis of unspecified deep veins of proximal lower extremity, bilateral: Secondary | ICD-10-CM | POA: Diagnosis present

## 2017-07-28 LAB — PROTIME-INR
INR: 2.31
Prothrombin Time: 25.2 seconds — ABNORMAL HIGH (ref 11.4–15.2)

## 2017-07-29 ENCOUNTER — Encounter: Payer: Self-pay | Admitting: Gerontology

## 2017-07-29 ENCOUNTER — Non-Acute Institutional Stay (SKILLED_NURSING_FACILITY): Payer: Medicare Other | Admitting: Gerontology

## 2017-07-29 DIAGNOSIS — N3000 Acute cystitis without hematuria: Secondary | ICD-10-CM

## 2017-07-29 DIAGNOSIS — I5033 Acute on chronic diastolic (congestive) heart failure: Secondary | ICD-10-CM | POA: Diagnosis not present

## 2017-07-29 DIAGNOSIS — R531 Weakness: Secondary | ICD-10-CM | POA: Diagnosis not present

## 2017-07-29 DIAGNOSIS — R0602 Shortness of breath: Secondary | ICD-10-CM | POA: Diagnosis not present

## 2017-07-29 NOTE — Progress Notes (Signed)
Location:   [facility] Room Number: [place] of Service:  SNF [phone])  Provider: Toni Arthurs, NP-C  PCP: [name], MD Patient Care Team: [name], MD as PCP - General (Pediatrics)  Extended Emergency Contact Information Primary Emergency Contact: [name] Address: [place], [place] [address] Johnnette Litter of [place] Phone: [phone] Mobile Phone: [phone] Relation: Relative  Code Status: FULL Goals of care:  Advanced Directive information Advanced Directives 07/29/2017  Does Patient Have a Medical Advance Directive? No  Type of Advance Directive -  Does patient want to make changes to medical advance directive? -  Copy of [place] in Chart? -  Would patient like information on creating a medical advance directive? -     Allergies  Allergen Reactions  . Sulfamethoxazole-Trimethoprim Nausea And Vomiting    Chief Complaint  Patient presents with  . Discharge Note    Discharged from SNF    HPI:  83 y.o. female seen today for discharge evaluation s/p admission to rehab from from [facility] for dyspnea. Pt has generalized weakness and deconditioning. She also an element of confusion r/t dementia. UA obtained earlier in the week- + UTI with Staph and E-Coli. Both susceptible to Macrobid. Pt has been working with PT/OT. She has not been eating well- doesn't like the food. Family encouraged to bring her favorite foods in. Pt does deny n/v/d/f/c/cp/sob/ha/abd pain/dizziness/cough. Pt reports she is feeling well and ready for discharge. VSS. No other complaints.      Past Medical History:  Diagnosis Date  . Acute deep vein thrombosis (DVT) of distal vein of left lower extremity ([place]) 10/03/2015  . Acute pulmonary embolism ([place]) 10/03/2015  . Anticoagulant long-term use   . Arthritis   . Chronic anticoagulation 10/03/2015  . Collagen vascular disease ([place])   . Colostomy in  place [name]) 10/04/2015  . Deep venous thrombosis (HCC)    right lower extremity  . Dependent edema   . Diverticulosis of intestine with bleeding 04/02/2015  . Glenohumeral arthritis 06/28/2012  . Hammertoe 09/02/2012  . History of bilateral hip replacements 09/02/2012  . History of hysterectomy   . Hypertension   . Hypokalemia   . Inability to ambulate due to hip 10/12/2015  . Mandibular dysfunction   . Onychomycosis 09/02/2012  . Osteoarthritis   . Polycythemia vera ([place])   . Presence of IVC filter 05/25/2015  . 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 Urinary incontinence in female 09/02/2016    Past Surgical History:  Procedure Laterality Date  . ABDOMINAL HYSTERECTOMY    . BLEPHAROPLASTY Right 03/2017   right upper eyelid  . CARDIAC CATHETERIZATION Right 04/03/2015   Procedure: CENTRAL LINE INSERTION;  Surgeon: Sherri Rad, MD;  Location: [facility] ORS;  Service: General;  Laterality: Right;  . COLECTOMY WITH COLOSTOMY CREATION/HARTMANN PROCEDURE N/A 04/03/2015   Procedure: COLECTOMY WITH COLOSTOMY CREATION/HARTMANN PROCEDURE;  Surgeon: Sherri Rad, MD;  Location: [facility] ORS;  Service: General;  Laterality: N/A;  . FOOT AMPUTATION     partial  . FOOT AMPUTATION Right   . JOINT REPLACEMENT     Left and Right Hip  . PERIPHERAL VASCULAR CATHETERIZATION N/A 04/02/2015   Procedure: Visceral Angiography;  Surgeon: Algernon Huxley, MD;  Location: [place] CV LAB;  Service: Cardiovascular;  Laterality: N/A;  . PERIPHERAL VASCULAR CATHETERIZATION N/A 04/02/2015   Procedure: Visceral Artery Intervention;  Surgeon: Algernon Huxley, MD;  Location: [place] CV LAB;  Service: Cardiovascular;  Laterality: N/A;  . PERIPHERAL VASCULAR CATHETERIZATION N/A 05/25/2015   Procedure: IVC Filter Insertion;  Surgeon: Katha Cabal, MD;  Location: [place] CV LAB;  Service: Cardiovascular;  Laterality: N/A;  . TOE AMPUTATION     right  . TOTAL HIP ARTHROPLASTY        reports that she has never  smoked. She has never used smokeless tobacco. She reports that she does not drink alcohol or use drugs. Social History   Social History  . Marital status: Widowed    Spouse name: N/A  . Number of children: 5  . Years of education: N/A   Occupational History  . retired    Social History Main Topics  . Smoking status: Never Smoker  . Smokeless tobacco: Never Used  . Alcohol use No  . Drug use: No  . Sexual activity: No   Other Topics Concern  . Not on file   Social History Narrative   Lives at [place]:    Allergies  Allergen Reactions  . Sulfamethoxazole-Trimethoprim Nausea And Vomiting    Pertinent  Health Maintenance Due  Topic Date Due  . DEXA SCAN  03/10/1999  . INFLUENZA VACCINE  06/10/2017  . PNA vac Low Risk Adult  Completed    Medications: Allergies as of 07/29/2017      Reactions   Sulfamethoxazole-trimethoprim Nausea And Vomiting      Medication List       Accurate as of 07/29/17 12:52 PM. Always use your most recent med list.          amLODipine 5 MG tablet Commonly known as:  NORVASC Take 1 tablet (5 mg total) by mouth daily.   aspirin EC 81 MG tablet Take 1 tablet (81 mg total) by mouth daily.   cyanocobalamin 1000 MCG tablet Take 2,000 mcg by mouth daily. 2 tabs   docusate sodium 100 MG capsule Commonly known as:  COLACE Take 100 mg by mouth 2 (two) times daily.   ENSURE Take 1 Can by mouth 3 (three) times daily with meals.   feeding supplement (PRO-STAT SUGAR FREE 64) Liqd Take 30 mLs by mouth 2 (two) times daily between meals.   folic acid 191 MCG tablet Commonly known as:  FOLVITE Take 400 mcg by mouth daily.   furosemide 20 MG tablet Commonly known as:  LASIX Take 20 mg by mouth daily. On Monday and Thursday   hydroxyurea 500 MG capsule Commonly known as:  HYDREA Take 1,500 mg by mouth See admin instructions. Take daily on Mon, Wed, Friday a 8 am. 3 capsules = 1500 mg May take with food to  minimize GI side effects.   hydroxyurea 500 MG capsule Commonly known as:  HYDREA Take 2,000 mg by mouth See admin instructions. Take daily 4 caps = 2000 mg on Sunday, Tuesday, Thursday and Saturday. May take with food to minimize GI side effects.   ipratropium-albuterol 0.5-2.5 (3) MG/3ML Soln Commonly known as:  DUONEB Take 3 mLs by nebulization 4 (four) times daily.   metoprolol tartrate 25 MG tablet Commonly known as:  LOPRESSOR Take 25 mg by mouth 2 (two) times daily.   mirtazapine 7.5 MG tablet Commonly known as:  REMERON Take 15 mg by mouth at bedtime. 2 tabs   nitrofurantoin (macrocrystal-monohydrate) 100 MG capsule Commonly known as:  MACROBID Take 100 mg by mouth every 12 (twelve) hours.   polyethylene glycol packet Commonly known  as:  MIRALAX / GLYCOLAX Take 17 g by mouth daily.   potassium chloride 10 MEQ tablet Commonly known as:  K-DUR,KLOR-CON Take 10 mEq by mouth 2 (two) times a week. On Monday and Thursday  With lasix for potassium replacement   triamcinolone cream 0.1 % Commonly known as:  KENALOG Apply 1 application topically 2 (two) times daily. To affected area   Vitamin D 2000 units Caps Take 6,000 Units by mouth daily. 3 caps   warfarin 1 MG tablet Commonly known as:  COUMADIN Take 1 mg by mouth daily. Take along with 2.5 mg tablet to equal 3.5 mg total   warfarin 2.5 MG tablet Commonly known as:  COUMADIN Take 2.5 mg by mouth daily. Take along with 1 mg tablet to equal 3.5 mg total       Review of Systems  Unable to perform ROS: Dementia  Constitutional: Negative for activity change, appetite change, chills, diaphoresis and fever.  HENT: Negative for congestion, sneezing, sore throat, trouble swallowing and voice change.   Eyes: Negative.   Respiratory: Negative for apnea, cough, choking, chest tightness, shortness of breath and wheezing.   Cardiovascular: Negative for chest pain, palpitations and leg swelling.  Gastrointestinal: Negative  for abdominal distention, abdominal pain, constipation, diarrhea and nausea.       Established Colostomy in place  Genitourinary: Negative for difficulty urinating, dysuria, frequency and urgency.  Musculoskeletal: Positive for arthralgias (typical arthritis). Negative for back pain, gait problem and myalgias.  Skin: Negative for color change, pallor, rash and wound.  Neurological: Positive for weakness. Negative for dizziness, tremors, syncope, speech difficulty, numbness and headaches.  Psychiatric/Behavioral: Positive for confusion. Negative for agitation and behavioral problems.  All other systems reviewed and are negative.   Vitals:   07/29/17 1239  BP: (!) 121/56  Pulse: 67  Resp: (!) 21  Temp: (!) 97.4 F (36.3 C)  SpO2: 99%  Weight: 201 lb 4.8 oz (91.3 kg)  Height: 5\' 7"  (1.702 m)   Body mass index is 31.53 kg/m. Physical Exam  Constitutional: She is oriented to person, place, and time. Vital signs are normal. She appears well-developed and well-nourished. She is active and cooperative. She does not appear ill. No distress.  HENT:  Head: Normocephalic and atraumatic.  Mouth/Throat: Uvula is midline, oropharynx is clear and moist and mucous membranes are normal. Mucous membranes are not pale, not dry and not cyanotic.  Eyes: Pupils are equal, round, and reactive to light. Conjunctivae, EOM and lids are normal.  Neck: Trachea normal, normal range of motion and full passive range of motion without pain. Neck supple. No JVD present. No tracheal deviation, no edema and no erythema present. No thyromegaly present.  Cardiovascular: Normal rate, regular rhythm, normal heart sounds, intact distal pulses and normal pulses.  Exam reveals no gallop, no distant heart sounds and no friction rub.   No murmur heard. Minimal edema  Pulmonary/Chest: Effort normal and breath sounds normal. No accessory muscle usage. No respiratory distress. She has no wheezes. She has no rales. She exhibits no  tenderness.  Abdominal: Normal appearance and bowel sounds are normal. She exhibits no distension and no ascites. There is no tenderness.  Musculoskeletal: Normal range of motion. She exhibits no edema or tenderness.  Expected osteoarthritis, stiffness  Neurological: She is alert and oriented to person, place, and time. She has normal strength.  Skin: Skin is warm, dry and intact. No rash noted. She is not diaphoretic. No cyanosis or erythema. No pallor. Nails show  no clubbing.  Psychiatric: She has a normal mood and affect. Her speech is normal and behavior is normal. Judgment and thought content normal. Cognition and memory are normal.  Nursing note and vitals reviewed.   Labs reviewed: Basic Metabolic Panel:  Recent Labs  11/25/16 0420  05/27/17 1135 05/28/17 0409 07/18/17 1300  NA 139  < > 141 140 136  K 4.4  < > 4.0 4.1 4.5  CL 107  < > 108 110 104  CO2 25  < > 25 27 27   GLUCOSE 95  < > 104* 104* 103*  BUN 9  < > 9 10 8   CREATININE 0.41*  < > 0.62 0.54 0.49  CALCIUM 8.3*  < > 8.9 8.2* 8.2*  MG 1.8  --   --   --  1.9  < > = values in this interval not displayed. Liver Function Tests:  Recent Labs  05/23/17 1922 05/27/17 1135 07/18/17 1300  AST 22 18 16   ALT 9* 10* 7*  ALKPHOS 81 76 67  BILITOT 0.9 0.7 0.6  PROT 7.3 7.2 6.8  ALBUMIN 2.9* 2.8* 2.7*    Recent Labs  04/09/17 2140  LIPASE 17   No results for input(s): AMMONIA in the last 8760 hours. CBC:  Recent Labs  05/08/17 1754 05/23/17 1922  05/27/17 1135 05/28/17 0409 07/18/17 1300  WBC 3.2* 10.8  < > 13.6* 13.2* 5.6  NEUTROABS 2.0 8.3*  --   --   --  4.5  HGB 13.5 14.0  < > 13.8 12.0 12.6  HCT 41.1 42.5  < > 41.0 36.7 38.6  MCV 90.4 90.2  < > 89.5 90.5 87.6  PLT 872* 1,234*  < > 1,540* 1,382* 535*  < > = values in this interval not displayed. Cardiac Enzymes:  Recent Labs  11/22/16 2338 11/23/16 0329 05/27/17 1135  TROPONINI <0.03 <0.03 <0.03   BNP: Invalid input(s): POCBNP CBG: No  results for input(s): GLUCAP in the last 8760 hours.  Procedures and Imaging Studies During Stay: No results found.  Assessment/Plan:   1. Acute on chronic diastolic congestive heart failure (HCC)  Continue lasix 20 mg po Q Day  Continue K-Dur 20 mEq twice weekly  Follow up with cardiologist asap after discharge for continuity of care  2. SOB (shortness of breath)  Continue Duonebs QID  3. Acute cystitis without hematuria  Continue Macrobid 100 mg po BID until complete  4. Generalized weakness  Continue working with PT/OT  Continue exercises as taught by PT/OT  Frequent rest breaks if needed   Patient is being discharged with the following [facility] health services: [facility]OT[facility]    Patient is being discharged with the following durable medical equipment: none   Patient has been advised to f/u with their PCP in 1-2 weeks to bring them up to date on their rehab stay.  Social services at facility was responsible for arranging this appointment.  Pt was provided with a 30 day supply of prescriptions for medications and refills must be obtained from their PCP.  For controlled substances, a more limited supply may be provided adequate until PCP appointment only.  Future labs/tests needed:    Family/ staff Communication:   Total Time:  Documentation:  Face to Face:  Family/Phone:  Vikki Ports, NP-C [facility] [address], [place] [address] Cell Phone (Mon-Fri 8am-5pm):  [phone] On Call:  [phone] & follow prompts after 5pm & weekends Office  Phone:  [phone] Office Fax:  [phone]

## 2017-08-03 ENCOUNTER — Telehealth: Payer: Self-pay | Admitting: *Deleted

## 2017-08-03 NOTE — Telephone Encounter (Signed)
I ced and spoke with Crystal and let her know that the patient needs an appointment every 3 months and it has been 4 months since last seen and that Dr Mike Gip is fine with drawing labs when not seen here, but first she needs an appointment here. She will have daughter call for an appt

## 2017-08-03 NOTE — Telephone Encounter (Signed)
  We have not seen her in 4 months.  She needs to be seen in clinic at minimum of every 3 months.  She can have labs done through home health when we are not seeing her.  M

## 2017-08-03 NOTE — Telephone Encounter (Signed)
We will give the daughter a call to r\s. Thank you

## 2017-08-03 NOTE — Telephone Encounter (Addendum)
Home health nurse called to see if you want her weekly labs to be drawn by them so she doesn't have to come in to office so much. CBC and PT INR Can call verbal or fax order to [phone]. Please advise

## 2017-08-03 NOTE — Telephone Encounter (Signed)
Probably best if we call her to reschedule it so it doesn't get lost to follow up  Thank you

## 2017-08-03 NOTE — Telephone Encounter (Signed)
So the daughter will contact us to schedule the appt?

## 2017-08-03 NOTE — Telephone Encounter (Signed)
She has no follow up appts with you, please advise

## 2017-08-13 ENCOUNTER — Encounter: Payer: Self-pay | Admitting: Hematology and Oncology

## 2017-08-13 ENCOUNTER — Inpatient Hospital Stay: Payer: Medicare Other

## 2017-08-13 ENCOUNTER — Other Ambulatory Visit: Payer: Self-pay | Admitting: *Deleted

## 2017-08-13 ENCOUNTER — Inpatient Hospital Stay: Payer: Medicare Other | Attending: Hematology and Oncology | Admitting: Hematology and Oncology

## 2017-08-13 VITALS — BP 123/81 | HR 60 | Temp 97.5°F | Resp 18 | Wt 200.4 lb

## 2017-08-13 DIAGNOSIS — Z8673 Personal history of transient ischemic attack (TIA), and cerebral infarction without residual deficits: Secondary | ICD-10-CM | POA: Diagnosis not present

## 2017-08-13 DIAGNOSIS — Z933 Colostomy status: Secondary | ICD-10-CM | POA: Diagnosis not present

## 2017-08-13 DIAGNOSIS — R0601 Orthopnea: Secondary | ICD-10-CM | POA: Diagnosis not present

## 2017-08-13 DIAGNOSIS — H02009 Unspecified entropion of unspecified eye, unspecified eyelid: Secondary | ICD-10-CM | POA: Insufficient documentation

## 2017-08-13 DIAGNOSIS — Z86711 Personal history of pulmonary embolism: Secondary | ICD-10-CM | POA: Diagnosis not present

## 2017-08-13 DIAGNOSIS — R600 Localized edema: Secondary | ICD-10-CM | POA: Diagnosis not present

## 2017-08-13 DIAGNOSIS — Z89431 Acquired absence of right foot: Secondary | ICD-10-CM | POA: Diagnosis not present

## 2017-08-13 DIAGNOSIS — Z7901 Long term (current) use of anticoagulants: Secondary | ICD-10-CM | POA: Insufficient documentation

## 2017-08-13 DIAGNOSIS — Z79899 Other long term (current) drug therapy: Secondary | ICD-10-CM

## 2017-08-13 DIAGNOSIS — Z86718 Personal history of other venous thrombosis and embolism: Secondary | ICD-10-CM | POA: Insufficient documentation

## 2017-08-13 DIAGNOSIS — D45 Polycythemia vera: Secondary | ICD-10-CM | POA: Diagnosis present

## 2017-08-13 DIAGNOSIS — I509 Heart failure, unspecified: Secondary | ICD-10-CM | POA: Diagnosis not present

## 2017-08-13 DIAGNOSIS — Z7982 Long term (current) use of aspirin: Secondary | ICD-10-CM | POA: Insufficient documentation

## 2017-08-13 DIAGNOSIS — Z8701 Personal history of pneumonia (recurrent): Secondary | ICD-10-CM | POA: Insufficient documentation

## 2017-08-13 DIAGNOSIS — M1990 Unspecified osteoarthritis, unspecified site: Secondary | ICD-10-CM | POA: Insufficient documentation

## 2017-08-13 DIAGNOSIS — Z882 Allergy status to sulfonamides status: Secondary | ICD-10-CM

## 2017-08-13 DIAGNOSIS — D693 Immune thrombocytopenic purpura: Secondary | ICD-10-CM

## 2017-08-13 DIAGNOSIS — I110 Hypertensive heart disease with heart failure: Secondary | ICD-10-CM | POA: Diagnosis not present

## 2017-08-13 DIAGNOSIS — E876 Hypokalemia: Secondary | ICD-10-CM | POA: Diagnosis not present

## 2017-08-13 LAB — COMPREHENSIVE METABOLIC PANEL
ALT: 7 U/L — ABNORMAL LOW (ref 14–54)
AST: 17 U/L (ref 15–41)
Albumin: 2.8 g/dL — ABNORMAL LOW (ref 3.5–5.0)
Alkaline Phosphatase: 72 U/L (ref 38–126)
Anion gap: 8 (ref 5–15)
BUN: 9 mg/dL (ref 6–20)
CO2: 23 mmol/L (ref 22–32)
Calcium: 8.8 mg/dL — ABNORMAL LOW (ref 8.9–10.3)
Chloride: 104 mmol/L (ref 101–111)
Creatinine, Ser: 0.43 mg/dL — ABNORMAL LOW (ref 0.44–1.00)
GFR calc Af Amer: >60 mL/min (ref >60)
GFR calc non Af Amer: >60 mL/min (ref >60)
Glucose, Bld: 100 mg/dL — ABNORMAL HIGH (ref 65–99)
Potassium: 4.3 mmol/L (ref 3.5–5.1)
Sodium: 135 mmol/L (ref 135–145)
Total Bilirubin: 0.7 mg/dL (ref 0.3–1.2)
Total Protein: 7.6 g/dL (ref 6.5–8.1)

## 2017-08-13 LAB — CBC WITH DIFFERENTIAL/PLATELET
Basophils Absolute: 0 10*3/uL (ref 0–0.1)
Basophils Relative: 1 %
Eosinophils Absolute: 0.1 10*3/uL (ref 0–0.7)
Eosinophils Relative: 3 %
HCT: 41.1 % (ref 35.0–47.0)
Hemoglobin: 13.4 g/dL (ref 12.0–16.0)
Lymphocytes Relative: 35 %
Lymphs Abs: 1.3 10*3/uL (ref 1.0–3.6)
MCH: 26.7 pg (ref 26.0–34.0)
MCHC: 32.5 g/dL (ref 32.0–36.0)
MCV: 82.3 fL (ref 80.0–100.0)
Monocytes Absolute: 0.1 10*3/uL — ABNORMAL LOW (ref 0.2–0.9)
Monocytes Relative: 2 %
Neutro Abs: 2.3 10*3/uL (ref 1.4–6.5)
Neutrophils Relative %: 59 %
Platelets: 595 10*3/uL — ABNORMAL HIGH (ref 150–440)
RBC: 5 MIL/uL (ref 3.80–5.20)
RDW: 33.2 % — ABNORMAL HIGH (ref 11.5–14.5)
WBC: 3.8 10*3/uL (ref 3.6–11.0)

## 2017-08-13 LAB — PROTIME-INR
INR: 1.94
Prothrombin Time: 22 seconds — ABNORMAL HIGH (ref 11.4–15.2)

## 2017-08-13 NOTE — Progress Notes (Signed)
Patient offers no complaints. 

## 2017-08-13 NOTE — Progress Notes (Signed)
Yolanda Wells Clinic day:  08/13/2017   Chief Complaint: Yolanda Wells is a 83 y.o. female with polycythemia rubra vera (PV) who is seen for reassessment.  HPI:   The patient was last seen in the medical oncology clinic on 04/07/2017.  At that time, she felt "fine". She was taking hydroxyurea 2 gm/day (28 pills/week).  Exam revealed chronic right sided entropion.  Hematocrit was 40.8.  Platelet count was 963,000.  INR was 2.24. She was scheduled to see Dr. Sterling Wells at [facility] for consideration of P32.  To date, patient has not been seen in consult by Dr. Gaylyn Wells. She did, however, receive the P32 on 05/14/2017.   Patient's granddaughter noted that patient has been admitted to [facility] for 2.5 weeks with pneumonia. She discharged to rehab facility for a few days. Patient developed edema and ended up back at [facility] for another week (07/03/2017 - 07/12/2017) for CHF. Patient was discharged back to rehab facility. She has been back at her home for 2 weeks.  Hydroxyurea was stopped by Dr. Gaylyn Wells. while admitted.  During that time, the patient's platelets were "the highest they had ever been". Patient underwent plasmapheresis x 1. Hydroxyurea was restarted on 05/21/2017. Regimen was changed on  07/12/2017 to '500mg'$  x 3 tabs ('1500mg'$ ) on M/W/F and '500mg'$  x 4 tabs ('2000mg'$  on T/Th/Sat/Sun (25 pills a week).  Symptomatically, patient developed recurrent shortness of breath last night. Patient orthopneic. She has had to prop up on several pillows last night. She has PRN Lasix. She was started on Metoprolol '50mg'$  and Amlodipine '5mg'$  daily. Patient is doing best to follow a DASH diet.   In the clinic today, patient denies chest pain and shortness of breath. Patient has a home caregiver daily, until 1300, to assist with her activities of daily living. Patient is alone 2 hours a day until the granddaughter gets off of work at 1500. She requires assistance with dressing, meal preparation, and  bathing. Patient eating well.  She denies weight loss. Patient denies bruising and bleeding. She continues on her Coumadin 3.5 mg daily.   INR was 2.31 on 07/28/2017.   Past Medical History:  Diagnosis Date  . Acute deep vein thrombosis (DVT) of distal vein of left lower extremity (Yolanda Wells) 10/03/2015  . Acute pulmonary embolism (Yolanda Wells) 10/03/2015  . Anticoagulant long-term use   . Arthritis   . Chronic anticoagulation 10/03/2015  . Collagen vascular disease ([place])   . Colostomy in place [facility]) 10/04/2015  . Deep venous thrombosis ([facility])    right lower extremity  . Dependent edema   . Diverticulosis of intestine with bleeding 04/02/2015  . Glenohumeral arthritis 06/28/2012  . Hammertoe 09/02/2012  . History of bilateral hip replacements 09/02/2012  . History of hysterectomy   . Hypertension   . Hypokalemia   . Inability to ambulate due to hip 10/12/2015  . Mandibular dysfunction   . Onychomycosis 09/02/2012  . Osteoarthritis   . Polycythemia vera ([place])   . Presence of IVC filter 05/25/2015  . Stroke ([place]) 03/26/2015   cerebellar  . Urinary incontinence in female 09/02/2016    Past Surgical History:  Procedure Laterality Date  . ABDOMINAL HYSTERECTOMY    . BLEPHAROPLASTY Right 03/2017   right upper eyelid  . CARDIAC CATHETERIZATION Right 04/03/2015   Procedure: CENTRAL LINE INSERTION;  Surgeon: Yolanda Rad, MD;  Location: [facility] ORS;  Service: General;  Laterality: Right;  . COLECTOMY WITH COLOSTOMY CREATION/HARTMANN PROCEDURE N/A 04/03/2015   Procedure:  COLECTOMY WITH COLOSTOMY CREATION/HARTMANN PROCEDURE;  Surgeon: Yolanda Lay, MD;  Location: [facility] ORS;  Service: General;  Laterality: N/A;  . FOOT AMPUTATION     partial  . FOOT AMPUTATION Right   . JOINT REPLACEMENT     Left and Right Hip  . PERIPHERAL VASCULAR CATHETERIZATION N/A 04/02/2015   Procedure: Visceral Angiography;  Surgeon: Annice Needy, MD;  Location: [facility] INVASIVE CV LAB;  Service: Cardiovascular;  Laterality: N/A;  .  PERIPHERAL VASCULAR CATHETERIZATION N/A 04/02/2015   Procedure: Visceral Artery Intervention;  Surgeon: Annice Needy, MD;  Location: [facility] INVASIVE CV LAB;  Service: Cardiovascular;  Laterality: N/A;  . PERIPHERAL VASCULAR CATHETERIZATION N/A 05/25/2015   Procedure: IVC Filter Insertion;  Surgeon: Renford Dills, MD;  Location: [facility] INVASIVE CV LAB;  Service: Cardiovascular;  Laterality: N/A;  . TOE AMPUTATION     right  . TOTAL HIP ARTHROPLASTY      Family History  Problem Relation Age of Onset  . Brain cancer Mother   . Heart attack Father   . COPD Father   . Coronary artery disease Father   . Hypertension Unknown   . Arthritis-Osteo Unknown   . Diabetes Brother   . Arthritis Brother   . Osteosarcoma Son   . Bone cancer Son   . Arthritis Sister   . Arthritis Brother   . Hypertension Brother   . Coronary artery disease Brother     Social History:  reports that she has never smoked. She has never used smokeless tobacco. She reports that she does not drink alcohol or use drugs.  She lives alone on [address] at [facility] (independent living).  She moved out of [employer].  She has been living with her grand daughter since 02/06/2017.  Her grand-daughter visits regularly Yolanda Wells [phone]).  The patient is accompanied by her grand daughter, Yolanda Wells, today.   Allergies:  Allergies  Allergen Reactions  . Sulfamethoxazole-Trimethoprim Nausea And Vomiting    Current Medications: Current Outpatient Prescriptions  Medication Sig Dispense Refill  . amLODipine (NORVASC) 5 MG tablet Take 1 tablet (5 mg total) by mouth daily. 30 tablet 11  . aspirin EC 81 MG tablet Take 1 tablet (81 mg total) by mouth daily. 30 tablet 2  . docusate sodium (COLACE) 100 MG capsule Take 100 mg by mouth 2 (two) times daily.    . folic acid (FOLVITE) 400 MCG tablet Take 400 mcg by mouth daily.    . furosemide (LASIX) 20 MG tablet Take 20 mg by mouth daily. On Monday and Thursday    .  hydroxyurea (HYDREA) 500 MG capsule Take 1,500 mg by mouth See admin instructions. Take daily on Mon, Wed, Friday a 8 am. 3 capsules = 1500 mg May take with food to minimize GI side effects.    . hydroxyurea (HYDREA) 500 MG capsule Take 2,000 mg by mouth See admin instructions. Take daily 4 caps = 2000 mg on Sunday, Tuesday, Thursday and Saturday. May take with food to minimize GI side effects.    [name] ipratropium-albuterol (DUONEB) 0.5-2.5 (3) MG/3ML SOLN Take 3 mLs by nebulization 4 (four) times daily.    . metoprolol tartrate (LOPRESSOR) 25 MG tablet Take 25 mg by mouth 2 (two) times daily.    . polyethylene glycol (MIRALAX / GLYCOLAX) packet Take 17 g by mouth daily.    . potassium chloride (K-DUR,KLOR-CON) 10 MEQ tablet Take 10 mEq by mouth 2 (two) times a week. On Monday and Thursday  With lasix for  potassium replacement    . warfarin (COUMADIN) 1 MG tablet Take 1 mg by mouth daily. Take along with 2.5 mg tablet to equal 3.5 mg total    . warfarin (COUMADIN) 2.5 MG tablet Take 2.5 mg by mouth daily. Take along with 1 mg tablet to equal 3.5 mg total    . Amino Acids-Protein Hydrolys (FEEDING SUPPLEMENT, PRO-STAT SUGAR FREE 64,) LIQD Take 30 mLs by mouth 2 (two) times daily between meals.    . Cholecalciferol (VITAMIN D) 2000 units CAPS Take 6,000 Units by mouth daily. 3 caps    . ENSURE (ENSURE) Take 1 Can by mouth 3 (three) times daily with meals.    . mirtazapine (REMERON) 7.5 MG tablet Take 15 mg by mouth at bedtime. 2 tabs    . triamcinolone cream (KENALOG) 0.1 % Apply 1 application topically 2 (two) times daily. To affected area     No current facility-administered medications for this visit.     Review of Systems:  GENERAL:  Feels "ok".  No fevers or sweats.  Weight down 24 pounds in 6 months. PERFORMANCE STATUS (ECOG):  3 HEENT:  Decreased vision in right eye s/p embolic event.  Vision better s/p entropion surgery.  No runny nose, sore throat, mouth sores or tenderness. Lungs:  No  shortness of breath or cough.  No hemoptysis. Cardiac:  No chest pain, palpitations, or PND.  Orthopnea.  Interval admission for CHF. GI:  No nausea, vomiting, diarrhea, constipation, melena or hematochezia.  Interval blood in stool, resolved. GU:  No urgency, frequency, dysuria, or hematuria.  Incontinent.  Wears adult diapers. Musculoskeletal:  Knee problems ("bone on bone").  No muscle tenderness. Extremities:  Chronic swelling in legs. Skin:  Dry skin. Neuro:  No headache, numbness or weakness, balance or coordination issues. Endocrine:  No diabetes, thyroid issues, hot flashes or night sweats. Psych:  No mood changes, depression or anxiety. Pain:  No focal pain. Review of systems:  All other systems reviewed and found to be negative.  Physical Exam: Blood pressure 123/81, pulse 60, temperature (!) 97.5 F (36.4 C), temperature source Tympanic, resp. rate 18, weight 200 lb 7 oz (90.9 kg). GENERAL:  Elderly woman sitting comfortably in a wheelchair in the exam room in no acute distress. MENTAL STATUS:  Alert and oriented to person, place and time. HEAD: Wallace Cullens hair.  Normocephalic, atraumatic, face symmetric, no Cushingoid features. EYES:  Brown eyes.  Right sided entropion.  Pupils equal round and reactive to light and accomodation.  No conjunctivitis or scleral icterus. ENT:  Oropharynx clear without lesion.  Upper dentures.  Tongue normal. Mucous membranes moist.  RESPIRATORY:  Clear to auscultation without rales, wheezes or rhonchi. CARDIOVASCULAR:  Regular rate and rhythm without murmur, rub or gallop. ABDOMEN:  Left sided colostomy.  Stool brown.  Soft, non-tender, with active bowel sounds, and no appreciable hepatosplenomegaly.  No masses. SKIN:  Right side of face thickened and dark.  No rashes or ulcers. EXTREMITIES:  Chronic lower extremity changes.  No skin discoloration or tenderness.  No palpable cords. LYMPH NODES: No palpable cervical, supraclavicular, axillary or inguinal  adenopathy  NEUROLOGICAL: Unremarkable. PSYCH:  Appropriate.   Appointment on 08/13/2017  Component Date Value Ref Range Status  . WBC 08/13/2017 3.8  3.6 - 11.0 K/uL Final  . RBC 08/13/2017 5.00  3.80 - 5.20 MIL/uL Final  . Hemoglobin 08/13/2017 13.4  12.0 - 16.0 g/dL Final  . HCT 51/88/5943 41.1  35.0 - 47.0 % Final  . MCV  08/13/2017 82.3  80.0 - 100.0 fL Final  . MCH 08/13/2017 26.7  26.0 - 34.0 pg Final  . MCHC 08/13/2017 32.5  32.0 - 36.0 g/dL Final  . RDW 08/13/2017 33.2* 11.5 - 14.5 % Final  . Platelets 08/13/2017 595* 150 - 440 K/uL Final  . Neutrophils Relative % 08/13/2017 59  % Final  . Neutro Abs 08/13/2017 2.3  1.4 - 6.5 K/uL Final  . Lymphocytes Relative 08/13/2017 35  % Final  . Lymphs Abs 08/13/2017 1.3  1.0 - 3.6 K/uL Final  . Monocytes Relative 08/13/2017 2  % Final  . Monocytes Absolute 08/13/2017 0.1* 0.2 - 0.9 K/uL Final  . Eosinophils Relative 08/13/2017 3  % Final  . Eosinophils Absolute 08/13/2017 0.1  0 - 0.7 K/uL Final  . Basophils Relative 08/13/2017 1  % Final  . Basophils Absolute 08/13/2017 0.0  0 - 0.1 K/uL Final  . Sodium 08/13/2017 135  135 - 145 mmol/L Final  . Potassium 08/13/2017 4.3  3.5 - 5.1 mmol/L Final  . Chloride 08/13/2017 104  101 - 111 mmol/L Final  . CO2 08/13/2017 23  22 - 32 mmol/L Final  . Glucose, Bld 08/13/2017 100* 65 - 99 mg/dL Final  . BUN 08/13/2017 9  6 - 20 mg/dL Final  . Creatinine, Ser 08/13/2017 0.43* 0.44 - 1.00 mg/dL Final  . Calcium 08/13/2017 8.8* 8.9 - 10.3 mg/dL Final  . Total Protein 08/13/2017 7.6  6.5 - 8.1 g/dL Final  . Albumin 08/13/2017 2.8* 3.5 - 5.0 g/dL Final  . AST 08/13/2017 17  15 - 41 U/L Final  . ALT 08/13/2017 7* 14 - 54 U/L Final  . Alkaline Phosphatase 08/13/2017 72  38 - 126 U/L Final  . Total Bilirubin 08/13/2017 0.7  0.3 - 1.2 mg/dL Final  . GFR calc non Af Amer 08/13/2017 >60  >60 mL/min Final  . GFR calc Af Amer 08/13/2017 >60  >60 mL/min Final   Comment: (NOTE) The eGFR has been  calculated using the CKD EPI equation. This calculation has not been validated in all clinical situations. eGFR's persistently <60 mL/min signify possible Chronic Kidney Disease.   . Anion gap 08/13/2017 8  5 - 15 Final  . Prothrombin Time 08/13/2017 22.0* 11.4 - 15.2 seconds Final  . INR 08/13/2017 1.94   Final    Assessment:  Yolanda Wells is a 83 y.o. female African-American woman with JAK2+ polycythemia rubra vera (PV) previously on a phlebotomy program and hydroxyurea. She received P32 in an attempt to manage her counts in 03/2015 and on 05/14/2017. [name]   Course has been complicated by a cerebellar CVA on 04/01/2015, splenic flexure bleeding requiring micro-embolization then colectomy on 04/03/2015. She was diagnosed with bilateral lower extremity DVTs on 05/18/2015 and bilateral pulmonary emboli on 05/24/2015. She underwent IVC filter placement on 05/25/2015.  She has been on a fluctuating dose of Coumadin secondary to unstable INR.  She is on a baby aspirin.  She developed progressive erythrocytosis, thrombocytosis, and leukocytosis.  She underwent phlebotomy for a hematocrit of 55.4 on 09/23/2015.  Hematocrit decreased to 50.0, but has again increased after initiation of oral iron.  Platelet count has increased from 1.1 million to 1.4 million.  White count increased from 23,000 - 28,000 to 31,800.  She was admitted on 08/25/2016 with loss of vision in her right eye.  CBC on 08/25/2016 revealed a hematocrit of 40.0, hemoglobin 13.5, MCV 91, platelets 842,000, WBC 4900.  INR was 2.59.  Etiology appeared to  be retinal artery occlusion suspected with embolic etiology.  MRI and MRA showed no acute changes.  She has been back on hydroxyurea since 10/02/2015.  Initial dose was 1000 mg a day.  She is currently taking 4 pills 4 days/week and 3 pills 3 days/week (total weekly dose: 24 pills).   She requires periodic phlebotomies (goal hematocrit <= 42).  Platelet count remains elevated  (secondary to PV and likely some component of iron deficiency).  Goal platelet count is 400,000.  She is on Coumadin 3.5 mg daily  (total weekly dose 24.5 mg).  INR goal 2-3.  She was admitted to [facility] for 2.5 weeks with pneumonia then returned 07/03/2017 - 07/12/2017 with CHF.  Symptomatically, she feels "fine". She is taking hydroxyurea 25 pills/week.  Exam reveals chronic right sided entropion.  Hematocrit is 41.1  Platelet count is 595,000.  INR is 1.94  Plan: 1.  Labs today:  CBC with diff, CMP, PT/INR. 2.  Discuss platelet goal of < 400,000. Increase Hydroxyurea '500mg'$  to 4 pills x 5 days week, and 2 pills x 3 days a week (total weekly dose of 26 pills). 3.  No phlebotomy today.  4.  Continue Coumadin 3.'5mg'$  daily.  5.  Set up [facility] home health for labs: (PT/INR) next week, (CBC with diff) in 3 weeks.  6.  RTC in 6 weeks for MD assessment and labs (CBC with diff, CMP, PT/INR) +/- phlebotomy.   Honor Loh, NP  08/13/2017, 3:34 PM   I saw and evaluated the patient, participating in the key portions of the service and reviewing pertinent diagnostic studies and records.  I reviewed the nurse practitioner's note and agree with the findings and the plan.  The assessment and plan were discussed with the patient.  Multiple questions were asked by the patient and answered.   Nolon Stalls, MD 08/13/2017, 3:34 PM

## 2017-08-20 ENCOUNTER — Telehealth: Payer: Self-pay | Admitting: *Deleted

## 2017-08-20 NOTE — Telephone Encounter (Signed)
Called results of PT/INR PT 29.7 INR 2.5  She has been on Keflex

## 2017-08-20 NOTE — Telephone Encounter (Signed)
Per Dr Humberto Seals, continue same dose of coumadin. [place] notified

## 2017-08-31 ENCOUNTER — Telehealth: Payer: Self-pay | Admitting: *Deleted

## 2017-08-31 NOTE — Telephone Encounter (Signed)
  Inquire dose of Coumadin.  Any missed doses?  Any change in other medications (ie, on antibiotics previously, now not)?  M

## 2017-08-31 NOTE — Telephone Encounter (Signed)
PT/INR results  PT 21.1 INR 1.8

## 2017-08-31 NOTE — Telephone Encounter (Signed)
Per patient no missed doses or new medications Unable to reach daughter for doing or other questions.

## 2017-09-01 ENCOUNTER — Other Ambulatory Visit: Payer: Self-pay | Admitting: *Deleted

## 2017-09-01 DIAGNOSIS — D45 Polycythemia vera: Secondary | ICD-10-CM

## 2017-09-01 NOTE — Telephone Encounter (Signed)
  INR subtherapeutic.  Increase Coumadin to 4 mg 4 days a week (Tues, Thurs, Sat, Sun) and 3.5 mg 3 days a week (Mon, Wed, Fri).  Check INR in 1 week.  M

## 2017-09-01 NOTE — Telephone Encounter (Signed)
[name] called back and reports that Mrs Rivere is taking 3.5 mg of warfarin daily

## 2017-09-01 NOTE — Telephone Encounter (Signed)
Called [name], Granddaughter to have her make the following changes in patients Coumadin. 4 mg 4 days (Tuesday, Thursday, Saturday and Sunday) 3.5 mg 3 days (Monday, Wednesday and Friday). Recheck INR in one week. [name] verbalized understanding.

## 2017-09-01 NOTE — Telephone Encounter (Signed)
I attempted to call Yolanda Wells again this morning for the current dose patient is taking of warfarin

## 2017-09-04 ENCOUNTER — Other Ambulatory Visit: Payer: Self-pay | Admitting: Urgent Care

## 2017-09-04 ENCOUNTER — Telehealth: Payer: Self-pay | Admitting: *Deleted

## 2017-09-04 DIAGNOSIS — D45 Polycythemia vera: Secondary | ICD-10-CM

## 2017-09-04 NOTE — Telephone Encounter (Signed)
Called patient's granddaughter [name] to inform her that due to patient being on Cipro, it may increase her INR.  Per MD - drop back to 3.5 mg of Coumadin daily and we will recheck her INR in a week.  [name] verbalized understanding.

## 2017-09-04 NOTE — Telephone Encounter (Signed)
-----   Message from Lequita Asal, MD sent at 09/04/2017  4:05 PM EDT ----- Regarding: Please call patient  PCP is putting patient on cipro for a UTI.  This will likely increase INR.  We just increased her Coumadin dose.  Let's go back to the prior dose (? 3.5 mg/day) and check INR early next week.  Patient needs to let us know if any increased bruising or bleeding.  M

## 2017-09-07 ENCOUNTER — Telehealth: Payer: Self-pay | Admitting: *Deleted

## 2017-09-07 NOTE — Telephone Encounter (Signed)
RN called INR results from today : 2.3   Eduard Clos      09/04/17 4:39 PM  Note    ----- Message from Lequita Asal, MD sent at 09/04/2017  4:05 PM EDT ----- Regarding: Please call patient  PCP is putting patient on cipro for a UTI.  This will likely increase INR.  We just increased her Coumadin dose.  Let's go back to the prior dose (? 3.5 mg/day) and check INR early next week.  Patient needs to let us know if any increased bruising or bleeding.  M

## 2017-09-07 NOTE — Telephone Encounter (Signed)
  Check INR on Friday  M

## 2017-09-08 ENCOUNTER — Telehealth: Payer: Self-pay | Admitting: *Deleted

## 2017-09-08 ENCOUNTER — Other Ambulatory Visit: Payer: Medicare Other

## 2017-09-08 ENCOUNTER — Encounter: Payer: Self-pay | Admitting: Hematology and Oncology

## 2017-09-08 NOTE — Telephone Encounter (Signed)
Yolanda Wells informed of order for repeat INR on Friday. Repeated back to me

## 2017-09-08 NOTE — Telephone Encounter (Signed)
Called Tasha from [place] (who had left a message earlier regarding what labs to draw this Friday, Nov.2. Informed her that CBC with diff and INR is needed.  Verbalized understanding

## 2017-09-11 ENCOUNTER — Inpatient Hospital Stay: Payer: Medicare Other | Attending: Hematology and Oncology

## 2017-09-11 ENCOUNTER — Telehealth: Payer: Self-pay | Admitting: *Deleted

## 2017-09-11 DIAGNOSIS — Z7901 Long term (current) use of anticoagulants: Secondary | ICD-10-CM

## 2017-09-11 DIAGNOSIS — D45 Polycythemia vera: Secondary | ICD-10-CM | POA: Diagnosis not present

## 2017-09-11 DIAGNOSIS — D709 Neutropenia, unspecified: Secondary | ICD-10-CM | POA: Diagnosis not present

## 2017-09-11 LAB — CBC WITH DIFFERENTIAL/PLATELET
Basophils Absolute: 0 10*3/uL (ref 0–0.1)
Basophils Relative: 1 %
Eosinophils Absolute: 0.1 10*3/uL (ref 0–0.7)
Eosinophils Relative: 4 %
HCT: 42.1 % (ref 35.0–47.0)
Hemoglobin: 13.6 g/dL (ref 12.0–16.0)
Lymphocytes Relative: 49 %
Lymphs Abs: 1.2 10*3/uL (ref 1.0–3.6)
MCH: 26.5 pg (ref 26.0–34.0)
MCHC: 32.2 g/dL (ref 32.0–36.0)
MCV: 82.3 fL (ref 80.0–100.0)
Monocytes Absolute: 0.1 10*3/uL — ABNORMAL LOW (ref 0.2–0.9)
Monocytes Relative: 3 %
Neutro Abs: 1.1 10*3/uL — ABNORMAL LOW (ref 1.4–6.5)
Neutrophils Relative %: 43 %
Platelets: 669 10*3/uL — ABNORMAL HIGH (ref 150–440)
RBC: 5.11 MIL/uL (ref 3.80–5.20)
RDW: 32.1 % — ABNORMAL HIGH (ref 11.5–14.5)
WBC: 2.5 10*3/uL — ABNORMAL LOW (ref 3.6–11.0)

## 2017-09-11 LAB — COMPREHENSIVE METABOLIC PANEL
ALT: 10 U/L — ABNORMAL LOW (ref 14–54)
AST: 22 U/L (ref 15–41)
Albumin: 3 g/dL — ABNORMAL LOW (ref 3.5–5.0)
Alkaline Phosphatase: 80 U/L (ref 38–126)
Anion gap: 6 (ref 5–15)
BUN: 13 mg/dL (ref 6–20)
CO2: 24 mmol/L (ref 22–32)
Calcium: 8.9 mg/dL (ref 8.9–10.3)
Chloride: 104 mmol/L (ref 101–111)
Creatinine, Ser: 0.45 mg/dL (ref 0.44–1.00)
GFR calc Af Amer: >60 mL/min (ref >60)
GFR calc non Af Amer: >60 mL/min (ref >60)
Glucose, Bld: 106 mg/dL — ABNORMAL HIGH (ref 65–99)
Potassium: 3.9 mmol/L (ref 3.5–5.1)
Sodium: 134 mmol/L — ABNORMAL LOW (ref 135–145)
Total Bilirubin: 0.6 mg/dL (ref 0.3–1.2)
Total Protein: 7.7 g/dL (ref 6.5–8.1)

## 2017-09-11 LAB — PROTIME-INR
INR: 1.58
Prothrombin Time: 18.7 seconds — ABNORMAL HIGH (ref 11.4–15.2)

## 2017-09-11 NOTE — Telephone Encounter (Signed)
Patient was to have PT/INR, CBC, CMP drawn today , but nurse unable to get any blood from patient, Needs to come into office for lab draw. Appointment given for 2:30 PM today Orders entered

## 2017-09-11 NOTE — Telephone Encounter (Signed)
Called patient's granddaughter, [name], and LVM that WBC is down and platelets are up.  MD would like to know if patient is taking any new medications or herbal products. Asked her to call me back and leave message.

## 2017-09-11 NOTE — Telephone Encounter (Signed)
   Ensure no new medications or herbal products.  Patient's WBC is down and her platelet count is up.  M

## 2017-09-14 ENCOUNTER — Telehealth: Payer: Self-pay | Admitting: *Deleted

## 2017-09-14 ENCOUNTER — Other Ambulatory Visit: Payer: Self-pay | Admitting: Hematology and Oncology

## 2017-09-14 DIAGNOSIS — D45 Polycythemia vera: Secondary | ICD-10-CM

## 2017-09-14 NOTE — Telephone Encounter (Signed)
-----   Message from Lequita Asal, MD sent at 09/11/2017  3:20 PM EDT ----- Regarding: Please call patient  INR low.  Increase Coumadin from 3.5 mg a day to  4 mg 4 days/week (spread out) and 3.5 mg 3 day/week.  INR in 1 week.  M  ----- Message ----- From: Interface, Lab In [place] Sent: 09/11/2017   1:16 PM To: Lequita Asal, MD

## 2017-09-14 NOTE — Telephone Encounter (Signed)
Called patient's granddaughter, [name] to have her adjust patient's Coumadin.  Patient should take 3.5 mgs 3 days a week and 4 mgs 4 days a week.  Will recheck labs on Friday.

## 2017-09-18 ENCOUNTER — Telehealth: Payer: Self-pay | Admitting: *Deleted

## 2017-09-18 MED ORDER — WARFARIN SODIUM 1 MG PO TABS: ORAL_TABLET | ORAL | 1 refills | Status: DC

## 2017-09-18 NOTE — Telephone Encounter (Signed)
Pharmacy called for refill of Warfarin 1 mg, I do not how she is taking her warfarin with 3.5 mg 3 days a week and 4 mg 4 days a week. Please sen appropriate prescription to [facility]

## 2017-09-18 NOTE — Telephone Encounter (Signed)
   Ok for refill of Coumadin as she is currently taking (but changes week to week)  M

## 2017-09-21 ENCOUNTER — Telehealth: Payer: Self-pay | Admitting: *Deleted

## 2017-09-21 MED ORDER — WARFARIN SODIUM 3 MG PO TABS: ORAL_TABLET | ORAL | 0 refills | Status: DC

## 2017-09-21 NOTE — Telephone Encounter (Signed)
Warfarin does not come in a 3.5 mg dose and needs prescription for 3 mg tab

## 2017-09-24 ENCOUNTER — Telehealth: Payer: Self-pay | Admitting: *Deleted

## 2017-09-24 ENCOUNTER — Inpatient Hospital Stay: Payer: Medicare Other | Admitting: Hematology and Oncology

## 2017-09-24 ENCOUNTER — Inpatient Hospital Stay: Payer: Medicare Other

## 2017-09-24 NOTE — Telephone Encounter (Signed)
Her home health provider already has the orders that they will require. I have previously sent them a standing PRN order.

## 2017-09-24 NOTE — Telephone Encounter (Signed)
Yolanda Wells called and said that she cancelled the lab appointment today due to the weather and not wanting to take her out in it. She is asking if you can get home health to draw her labs today and send an order to them. She reports that her PCP was unsuccessful drawing her blood Tuesday. Please call order to Home Health to draw her blood

## 2017-09-24 NOTE — Progress Notes (Deleted)
[facility] day:  09/24/2017   Chief Complaint: Yolanda Wells is a 83 y.o. female with polycythemia rubra vera (PV) who is seen for 6 week assessment.  HPI:   The patient was last seen in the medical oncology clinic on 08/13/2017.  At that time, she felt "fine". She was taking hydroxyurea 25 pills/week.  Exam revealed chronic right sided entropion.  Hematocrit was 41.1  Platelet count was 595,000.  INR was 1.94.  She did not undergo phlebotomy.  Hydrea was increased to 26 pills/week.  Coumadin 3.5 mg a day continued.  She has had labs drawn through [facility] home health.  During the interim,    Past Medical History:  Diagnosis Date  . Acute deep vein thrombosis (DVT) of distal vein of left lower extremity ([place]) 10/03/2015  . Acute pulmonary embolism ([place]) 10/03/2015  . Anticoagulant long-term use   . Arthritis   . Chronic anticoagulation 10/03/2015  . Collagen vascular disease ([place])   . Colostomy in place [facility]) 10/04/2015  . Deep venous thrombosis ([facility])    right lower extremity  . Dependent edema   . Diverticulosis of intestine with bleeding 04/02/2015  . Glenohumeral arthritis 06/28/2012  . Hammertoe 09/02/2012  . History of bilateral hip replacements 09/02/2012  . History of hysterectomy   . Hypertension   . Hypokalemia   . Inability to ambulate due to hip 10/12/2015  . Mandibular dysfunction   . Onychomycosis 09/02/2012  . Osteoarthritis   . Polycythemia vera ([place])   . Presence of IVC filter 05/25/2015  . Stroke ([place]) 03/26/2015   cerebellar  . Urinary incontinence in female 09/02/2016    Past Surgical History:  Procedure Laterality Date  . ABDOMINAL HYSTERECTOMY    . BLEPHAROPLASTY Right 03/2017   right upper eyelid  . CARDIAC CATHETERIZATION Right 04/03/2015   Procedure: CENTRAL LINE INSERTION;  Surgeon: Sherri Rad, MD;  Location: [facility] ORS;  Service: General;  Laterality: Right;  . COLECTOMY WITH COLOSTOMY  CREATION/HARTMANN PROCEDURE N/A 04/03/2015   Procedure: COLECTOMY WITH COLOSTOMY CREATION/HARTMANN PROCEDURE;  Surgeon: Sherri Rad, MD;  Location: [facility] ORS;  Service: General;  Laterality: N/A;  . FOOT AMPUTATION     partial  . FOOT AMPUTATION Right   . JOINT REPLACEMENT     Left and Right Hip  . PERIPHERAL VASCULAR CATHETERIZATION N/A 04/02/2015   Procedure: Visceral Angiography;  Surgeon: Algernon Huxley, MD;  Location: [place] [facility];  Service: Cardiovascular;  Laterality: N/A;  . PERIPHERAL VASCULAR CATHETERIZATION N/A 04/02/2015   Procedure: Visceral Artery Intervention;  Surgeon: Algernon Huxley, MD;  Location: [place] [facility];  Service: Cardiovascular;  Laterality: N/A;  . PERIPHERAL VASCULAR CATHETERIZATION N/A 05/25/2015   Procedure: IVC Filter Insertion;  Surgeon: Katha Cabal, MD;  Location: [place] [facility];  Service: Cardiovascular;  Laterality: N/A;  . TOE AMPUTATION     right  . TOTAL HIP ARTHROPLASTY      Family History  Problem Relation Age of Onset  . Brain cancer Mother   . Heart attack Father   . COPD Father   . Coronary artery disease Father   . Hypertension Unknown   . Arthritis-Osteo Unknown   . Diabetes Brother   . Arthritis Brother   . Osteosarcoma Son   . Bone cancer Son   . Arthritis Sister   . Arthritis Brother   . Hypertension Brother   . Coronary artery disease Brother     Social History:  reports that  has never smoked. she has never used smokeless tobacco. She reports that she does not drink alcohol or use drugs.  She lives alone on [address] at [facility] (independent living).  She moved out of [facility].  She has been living with her grand daughter since 02/06/2017.  Her grand-daughter visits regularly [name] [phone]).  The patient is accompanied by her grand daughter, [name], today.   Allergies:  Allergies  Allergen Reactions  . Sulfamethoxazole-Trimethoprim Nausea And Vomiting    Current Medications: Current  Outpatient Medications  Medication Sig Dispense Refill  . Amino Acids-Protein Hydrolys (FEEDING SUPPLEMENT, PRO-STAT SUGAR FREE 64,) LIQD Take 30 mLs by mouth 2 (two) times daily between meals.    [name] amLODipine (NORVASC) 5 MG tablet Take 1 tablet (5 mg total) by mouth daily. 30 tablet 11  . aspirin EC 81 MG tablet Take 1 tablet (81 mg total) by mouth daily. 30 tablet 2  . Cholecalciferol (VITAMIN D) 2000 units CAPS Take 6,000 Units by mouth daily. 3 caps    . docusate sodium (COLACE) 100 MG capsule Take 100 mg by mouth 2 (two) times daily.    [name] ENSURE (ENSURE) Take 1 Can by mouth 3 (three) times daily with meals.    . folic acid (FOLVITE) 932 MCG tablet Take 400 mcg by mouth daily.    . furosemide (LASIX) 20 MG tablet Take 20 mg by mouth daily. On Monday and Thursday    . hydroxyurea (HYDREA) 500 MG capsule Take 1,500 mg by mouth See admin instructions. Take daily on Mon, Wed, Friday a 8 am. 3 capsules = 1500 mg May take with food to minimize GI side effects.    . hydroxyurea (HYDREA) 500 MG capsule Take 2,000 mg by mouth See admin instructions. Take daily 4 caps = 2000 mg on Sunday, Tuesday, Thursday and Saturday. May take with food to minimize GI side effects.    . hydroxyurea (HYDREA) 500 MG capsule TAKE FOUR CAPSULES BY MOUTH DAILY 120 capsule 0  . ipratropium-albuterol (DUONEB) 0.5-2.5 (3) MG/3ML SOLN Take 3 mLs by nebulization 4 (four) times daily.    . metoprolol tartrate (LOPRESSOR) 25 MG tablet Take 25 mg by mouth 2 (two) times daily.    . mirtazapine (REMERON) 7.5 MG tablet Take 15 mg by mouth at bedtime. 2 tabs    . polyethylene glycol (MIRALAX / GLYCOLAX) packet Take 17 g by mouth daily.    . potassium chloride (K-DUR,KLOR-CON) 10 MEQ tablet Take 10 mEq by mouth 2 (two) times a week. On Monday and Thursday  With lasix for potassium replacement    . triamcinolone cream (KENALOG) 0.1 % Apply 1 application topically 2 (two) times daily. To affected area    . warfarin (COUMADIN) 1 MG tablet  Take 0.5 tablet with a 3.5 mg tablet 4 days a week for total dose of 4 mg. Take 3.5 mg tablet 3 days a week until further notice 30 tablet 1  . warfarin (COUMADIN) 2.5 MG tablet Take 2.5 mg by mouth daily. Take along with 1 mg tablet to equal 3.5 mg total    . warfarin (COUMADIN) 3 MG tablet Take 1 tablet along with a 1 mg tablet for a total dose of 4 mg 4 days a week, take 3.5 mg 3 days a week 30 tablet 0   No current facility-administered medications for this visit.     Review of Systems:  GENERAL:  Feels "ok".  No fevers or sweats.  Weight down  24 pounds in 6 months. PERFORMANCE STATUS (ECOG):  3 HEENT:  Decreased vision in right eye s/p embolic event.  Vision better s/p entropion surgery.  No runny nose, sore throat, mouth sores or tenderness. Lungs:  No shortness of breath or cough.  No hemoptysis. Cardiac:  No chest pain, palpitations, or PND.  Orthopnea.  Interval admission for CHF. GI:  No nausea, vomiting, diarrhea, constipation, melena or hematochezia.  Interval blood in stool, resolved. GU:  No urgency, frequency, dysuria, or hematuria.  Incontinent.  Wears adult diapers. Musculoskeletal:  Knee problems ("bone on bone").  No muscle tenderness. Extremities:  Chronic swelling in legs. Skin:  Dry skin. Neuro:  No headache, numbness or weakness, balance or coordination issues. Endocrine:  No diabetes, thyroid issues, hot flashes or night sweats. Psych:  No mood changes, depression or anxiety. Pain:  No focal pain. Review of systems:  All other systems reviewed and found to be negative.  Physical Exam: There were no vitals taken for this visit. GENERAL:  Elderly woman sitting comfortably in a wheelchair in the exam room in no acute distress. MENTAL STATUS:  Alert and oriented to person, place and time. HEAD: Pearline Cables hair.  Normocephalic, atraumatic, face symmetric, no Cushingoid features. EYES:  Brown eyes.  Right sided entropion.  Pupils equal round and reactive to light and  accomodation.  No conjunctivitis or scleral icterus. ENT:  Oropharynx clear without lesion.  Upper dentures.  Tongue normal. Mucous membranes moist.  RESPIRATORY:  Clear to auscultation without rales, wheezes or rhonchi. CARDIOVASCULAR:  Regular rate and rhythm without murmur, rub or gallop. ABDOMEN:  Left sided colostomy.  Stool brown.  Soft, non-tender, with active bowel sounds, and no appreciable hepatosplenomegaly.  No masses. SKIN:  Right side of face thickened and dark.  No rashes or ulcers. EXTREMITIES:  Chronic lower extremity changes.  No skin discoloration or tenderness.  No palpable cords. LYMPH NODES: No palpable cervical, supraclavicular, axillary or inguinal adenopathy  NEUROLOGICAL: Unremarkable. PSYCH:  Appropriate.   No visits with results within 3 Day(s) from this visit.  Latest known visit with results is:  Appointment on 09/11/2017  Component Date Value Ref Range Status  . WBC 09/11/2017 2.5* 3.6 - 11.0 K/uL Final  . RBC 09/11/2017 5.11  3.80 - 5.20 MIL/uL Final  . Hemoglobin 09/11/2017 13.6  12.0 - 16.0 g/dL Final  . HCT 09/11/2017 42.1  35.0 - 47.0 % Final  . MCV 09/11/2017 82.3  80.0 - 100.0 fL Final  . MCH 09/11/2017 26.5  26.0 - 34.0 pg Final  . MCHC 09/11/2017 32.2  32.0 - 36.0 g/dL Final  . RDW 09/11/2017 32.1* 11.5 - 14.5 % Final  . Platelets 09/11/2017 669* 150 - 440 K/uL Final  . Neutrophils Relative % 09/11/2017 43  % Final  . Neutro Abs 09/11/2017 1.1* 1.4 - 6.5 K/uL Final  . Lymphocytes Relative 09/11/2017 49  % Final  . Lymphs Abs 09/11/2017 1.2  1.0 - 3.6 K/uL Final  . Monocytes Relative 09/11/2017 3  % Final  . Monocytes Absolute 09/11/2017 0.1* 0.2 - 0.9 K/uL Final  . Eosinophils Relative 09/11/2017 4  % Final  . Eosinophils Absolute 09/11/2017 0.1  0 - 0.7 K/uL Final  . Basophils Relative 09/11/2017 1  % Final  . Basophils Absolute 09/11/2017 0.0  0 - 0.1 K/uL Final  . Sodium 09/11/2017 134* 135 - 145 mmol/L Final  . Potassium 09/11/2017 3.9   3.5 - 5.1 mmol/L Final  . Chloride 09/11/2017 104  101 -  111 mmol/L Final  . CO2 09/11/2017 24  22 - 32 mmol/L Final  . Glucose, Bld 09/11/2017 106* 65 - 99 mg/dL Final  . BUN 09/11/2017 13  6 - 20 mg/dL Final  . Creatinine, Ser 09/11/2017 0.45  0.44 - 1.00 mg/dL Final  . Calcium 09/11/2017 8.9  8.9 - 10.3 mg/dL Final  . Total Protein 09/11/2017 7.7  6.5 - 8.1 g/dL Final  . Albumin 09/11/2017 3.0* 3.5 - 5.0 g/dL Final  . AST 09/11/2017 22  15 - 41 U/L Final  . ALT 09/11/2017 10* 14 - 54 U/L Final  . Alkaline Phosphatase 09/11/2017 80  38 - 126 U/L Final  . Total Bilirubin 09/11/2017 0.6  0.3 - 1.2 mg/dL Final  . GFR calc non Af Amer 09/11/2017 >60  >60 mL/min Final  . GFR calc Af Amer 09/11/2017 >60  >60 mL/min Final   Comment: (NOTE) The eGFR has been calculated using the CKD EPI equation. This calculation has not been validated in all clinical situations. eGFR's persistently <60 mL/min signify possible Chronic Kidney Disease.   . Anion gap 09/11/2017 6  5 - 15 Final  . Prothrombin Time 09/11/2017 18.7* 11.4 - 15.2 seconds Final  . INR 09/11/2017 1.58   Final    Assessment:  Yolanda Wells is a 83 y.o. female African-American woman with JAK2+ polycythemia rubra vera (PV) previously on a phlebotomy program and hydroxyurea. She received P32 in an attempt to manage her counts in 03/2015 and on 05/14/2017. [name]   Course has been complicated by a cerebellar CVA on 04/01/2015, splenic flexure bleeding requiring micro-embolization then colectomy on 04/03/2015. She was diagnosed with bilateral lower extremity DVTs on 05/18/2015 and bilateral pulmonary emboli on 05/24/2015. She underwent IVC filter placement on 05/25/2015.  She has been on a fluctuating dose of Coumadin secondary to unstable INR.  She is on a baby aspirin.  She developed progressive erythrocytosis, thrombocytosis, and leukocytosis.  She underwent phlebotomy for a hematocrit of 55.4 on 09/23/2015.  Hematocrit decreased to 50.0,  but has again increased after initiation of oral iron.  Platelet count has increased from 1.1 million to 1.4 million.  White count increased from 23,000 - 28,000 to 31,800.  She was admitted on 08/25/2016 with loss of vision in her right eye.  CBC on 08/25/2016 revealed a hematocrit of 40.0, hemoglobin 13.5, MCV 91, platelets 842,000, WBC 4900.  INR was 2.59.  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  MRI and MRA showed no acute changes.  She has been back on hydroxyurea since 10/02/2015.  Initial dose was 1000 mg a day.  She is currently taking 4 pills 4 days/week and 3 pills 3 days/week (total weekly dose: 24 pills).   She requires periodic phlebotomies (goal hematocrit <= 42).  Platelet count remains elevated (secondary to PV and likely some component of iron deficiency).  Goal platelet count is 400,000.  She is on Coumadin 3.5 mg daily  (total weekly dose 24.5 mg).  INR goal 2-3.  She was admitted to [facility] for 2.5 weeks with pneumonia then returned 07/03/2017 - 07/12/2017 with CHF.  Symptomatically, she feels "fine". She is taking hydroxyurea 25 pills/week.  Exam reveals chronic right sided entropion.  Hematocrit is 41.1  Platelet count is 595,000.  INR is 1.94  Plan: 1.  Labs today:  CBC with diff, CMP, PT/INR.  2.  Discuss platelet goal of < 400,000. Increase Hydroxyurea '500mg'$  to 4 pills x 5 days week, and  2 pills x 3 days a week (total weekly dose of 26 pills). 3.  No phlebotomy today.  4.  Continue Coumadin 3.'5mg'$  daily.  5.  Set up [facility] home health for labs: (PT/INR) next week, (CBC with diff) in 3 weeks.  6.  RTC in 6 weeks for MD assessment and labs (CBC with diff, CMP, PT/INR) +/- phlebotomy.   Lequita Asal, MD  09/24/2017, 5:27 AM   I saw and evaluated the patient, participating in the key portions of the service and reviewing pertinent diagnostic studies and records.  I reviewed the nurse practitioner's note and  agree with the findings and the plan.  The assessment and plan were discussed with the patient.  Multiple questions were asked by the patient and answered.   Nolon Stalls, MD 09/24/2017, 5:27 AM

## 2017-09-24 NOTE — Telephone Encounter (Signed)
Returned call to [name] that [facility] has standing order and she just needs to call them to draw it. Message left on voice mail

## 2017-09-29 ENCOUNTER — Encounter: Payer: Self-pay | Admitting: Hematology and Oncology

## 2017-09-30 ENCOUNTER — Telehealth: Payer: Self-pay | Admitting: *Deleted

## 2017-09-30 NOTE — Telephone Encounter (Signed)
Call returned to [facility] in informed to contact PCP

## 2017-09-30 NOTE — Telephone Encounter (Signed)
I spoke with her home health company yesterday. Despite Corcoran's name being on the result, we did not order a urine on her. Mike Gip wants her PCP to treat. I was told by the home health company that they sent [place] a copy of the results a courtesy, and that a copy had been sent to the PCP for treatment.

## 2017-09-30 NOTE — Telephone Encounter (Signed)
Urine Culture has grown out E Coli. Need orders Please call her [phone]

## 2017-10-06 ENCOUNTER — Inpatient Hospital Stay
Admission: EM | Admit: 2017-10-06 | Discharge: 2017-10-09 | DRG: 291 | Disposition: A | Discharge status: Home Health | Payer: Medicare Other | Attending: Internal Medicine | Admitting: Internal Medicine

## 2017-10-06 ENCOUNTER — Inpatient Hospital Stay
Admit: 2017-10-06 | Discharge: 2017-10-06 | Disposition: A | Discharge status: Home / Self Care | Payer: Medicare Other | Attending: Internal Medicine | Admitting: Internal Medicine

## 2017-10-06 ENCOUNTER — Emergency Department: Payer: Medicare Other

## 2017-10-06 ENCOUNTER — Other Ambulatory Visit: Payer: Self-pay

## 2017-10-06 DIAGNOSIS — G839 Paralytic syndrome, unspecified: Secondary | ICD-10-CM | POA: Diagnosis present

## 2017-10-06 DIAGNOSIS — Z7901 Long term (current) use of anticoagulants: Secondary | ICD-10-CM

## 2017-10-06 DIAGNOSIS — M19019 Primary osteoarthritis, unspecified shoulder: Secondary | ICD-10-CM | POA: Diagnosis present

## 2017-10-06 DIAGNOSIS — R531 Weakness: Secondary | ICD-10-CM | POA: Diagnosis present

## 2017-10-06 DIAGNOSIS — Z882 Allergy status to sulfonamides status: Secondary | ICD-10-CM

## 2017-10-06 DIAGNOSIS — Z7982 Long term (current) use of aspirin: Secondary | ICD-10-CM | POA: Diagnosis not present

## 2017-10-06 DIAGNOSIS — R32 Unspecified urinary incontinence: Secondary | ICD-10-CM | POA: Diagnosis present

## 2017-10-06 DIAGNOSIS — Z89431 Acquired absence of right foot: Secondary | ICD-10-CM

## 2017-10-06 DIAGNOSIS — G4709 Other insomnia: Secondary | ICD-10-CM | POA: Diagnosis not present

## 2017-10-06 DIAGNOSIS — R040 Epistaxis: Secondary | ICD-10-CM | POA: Diagnosis not present

## 2017-10-06 DIAGNOSIS — K766 Portal hypertension: Secondary | ICD-10-CM | POA: Diagnosis not present

## 2017-10-06 DIAGNOSIS — I272 Pulmonary hypertension, unspecified: Secondary | ICD-10-CM | POA: Diagnosis present

## 2017-10-06 DIAGNOSIS — R0602 Shortness of breath: Secondary | ICD-10-CM | POA: Diagnosis not present

## 2017-10-06 DIAGNOSIS — M26609 Unspecified temporomandibular joint disorder, unspecified side: Secondary | ICD-10-CM | POA: Diagnosis present

## 2017-10-06 DIAGNOSIS — D709 Neutropenia, unspecified: Secondary | ICD-10-CM | POA: Diagnosis present

## 2017-10-06 DIAGNOSIS — I110 Hypertensive heart disease with heart failure: Principal | ICD-10-CM | POA: Diagnosis present

## 2017-10-06 DIAGNOSIS — M359 Systemic involvement of connective tissue, unspecified: Secondary | ICD-10-CM | POA: Diagnosis present

## 2017-10-06 DIAGNOSIS — R791 Abnormal coagulation profile: Secondary | ICD-10-CM | POA: Diagnosis present

## 2017-10-06 DIAGNOSIS — B9620 Unspecified Escherichia coli [E. coli] as the cause of diseases classified elsewhere: Secondary | ICD-10-CM | POA: Diagnosis present

## 2017-10-06 DIAGNOSIS — I5031 Acute diastolic (congestive) heart failure: Secondary | ICD-10-CM | POA: Diagnosis present

## 2017-10-06 DIAGNOSIS — I509 Heart failure, unspecified: Secondary | ICD-10-CM

## 2017-10-06 DIAGNOSIS — M1990 Unspecified osteoarthritis, unspecified site: Secondary | ICD-10-CM | POA: Diagnosis present

## 2017-10-06 DIAGNOSIS — D45 Polycythemia vera: Secondary | ICD-10-CM | POA: Diagnosis present

## 2017-10-06 DIAGNOSIS — Z79899 Other long term (current) drug therapy: Secondary | ICD-10-CM | POA: Diagnosis not present

## 2017-10-06 DIAGNOSIS — Z6832 Body mass index (BMI) 32.0-32.9, adult: Secondary | ICD-10-CM

## 2017-10-06 DIAGNOSIS — Z96643 Presence of artificial hip joint, bilateral: Secondary | ICD-10-CM | POA: Diagnosis present

## 2017-10-06 DIAGNOSIS — Z66 Do not resuscitate: Secondary | ICD-10-CM | POA: Diagnosis present

## 2017-10-06 DIAGNOSIS — J9600 Acute respiratory failure, unspecified whether with hypoxia or hypercapnia: Secondary | ICD-10-CM | POA: Diagnosis present

## 2017-10-06 DIAGNOSIS — I2510 Atherosclerotic heart disease of native coronary artery without angina pectoris: Secondary | ICD-10-CM | POA: Diagnosis present

## 2017-10-06 DIAGNOSIS — J9811 Atelectasis: Secondary | ICD-10-CM | POA: Diagnosis present

## 2017-10-06 DIAGNOSIS — Z933 Colostomy status: Secondary | ICD-10-CM

## 2017-10-06 DIAGNOSIS — Z86718 Personal history of other venous thrombosis and embolism: Secondary | ICD-10-CM | POA: Diagnosis not present

## 2017-10-06 DIAGNOSIS — Z8673 Personal history of transient ischemic attack (TIA), and cerebral infarction without residual deficits: Secondary | ICD-10-CM

## 2017-10-06 DIAGNOSIS — E669 Obesity, unspecified: Secondary | ICD-10-CM | POA: Diagnosis present

## 2017-10-06 DIAGNOSIS — Z86711 Personal history of pulmonary embolism: Secondary | ICD-10-CM

## 2017-10-06 DIAGNOSIS — Z95828 Presence of other vascular implants and grafts: Secondary | ICD-10-CM

## 2017-10-06 DIAGNOSIS — Z87440 Personal history of urinary (tract) infections: Secondary | ICD-10-CM | POA: Diagnosis not present

## 2017-10-06 DIAGNOSIS — N3000 Acute cystitis without hematuria: Secondary | ICD-10-CM | POA: Diagnosis present

## 2017-10-06 DIAGNOSIS — T451X5A Adverse effect of antineoplastic and immunosuppressive drugs, initial encounter: Secondary | ICD-10-CM | POA: Diagnosis present

## 2017-10-06 DIAGNOSIS — I252 Old myocardial infarction: Secondary | ICD-10-CM

## 2017-10-06 LAB — CBC WITH DIFFERENTIAL/PLATELET
BASOS PCT: 2 %
Basophils Absolute: 0 10*3/uL (ref 0–0.1)
EOS ABS: 0.1 10*3/uL (ref 0–0.7)
Eosinophils Relative: 3 %
HEMATOCRIT: 40.6 % (ref 35.0–47.0)
HEMOGLOBIN: 12.9 g/dL (ref 12.0–16.0)
LYMPHS ABS: 1 10*3/uL (ref 1.0–3.6)
Lymphocytes Relative: 50 %
MCH: 26.1 pg (ref 26.0–34.0)
MCHC: 31.7 g/dL — ABNORMAL LOW (ref 32.0–36.0)
MCV: 82.4 fL (ref 80.0–100.0)
MONOS PCT: 2 %
Monocytes Absolute: 0 10*3/uL — ABNORMAL LOW (ref 0.2–0.9)
NEUTROS ABS: 0.9 10*3/uL — AB (ref 1.4–6.5)
NEUTROS PCT: 43 %
Platelets: 610 10*3/uL — ABNORMAL HIGH (ref 150–440)
RBC: 4.92 MIL/uL (ref 3.80–5.20)
RDW: 32 % — ABNORMAL HIGH (ref 11.5–14.5)
WBC: 2.1 10*3/uL — AB (ref 3.6–11.0)

## 2017-10-06 LAB — BASIC METABOLIC PANEL
ANION GAP: 8 (ref 5–15)
BUN: 12 mg/dL (ref 6–20)
CALCIUM: 8.6 mg/dL — AB (ref 8.9–10.3)
CO2: 24 mmol/L (ref 22–32)
Chloride: 105 mmol/L (ref 101–111)
Creatinine, Ser: 0.52 mg/dL (ref 0.44–1.00)
GLUCOSE: 103 mg/dL — AB (ref 65–99)
POTASSIUM: 4.8 mmol/L (ref 3.5–5.1)
Sodium: 137 mmol/L (ref 135–145)

## 2017-10-06 LAB — URINALYSIS, COMPLETE (UACMP) WITH MICROSCOPIC
BILIRUBIN URINE: NEGATIVE
Bacteria, UA: NONE SEEN
GLUCOSE, UA: NEGATIVE mg/dL
HGB URINE DIPSTICK: NEGATIVE
Ketones, ur: NEGATIVE mg/dL
Leukocytes, UA: NEGATIVE
NITRITE: NEGATIVE
PROTEIN: NEGATIVE mg/dL
Specific Gravity, Urine: 1.004 — ABNORMAL LOW (ref 1.005–1.030)
pH: 7 (ref 5.0–8.0)

## 2017-10-06 LAB — TROPONIN I: Troponin I: <0.03 ng/mL (ref <0.03)

## 2017-10-06 LAB — PROTIME-INR
INR: 1.39
Prothrombin Time: 16.9 seconds — ABNORMAL HIGH (ref 11.4–15.2)

## 2017-10-06 LAB — BRAIN NATRIURETIC PEPTIDE: B Natriuretic Peptide: 79 pg/mL (ref 0.0–100.0)

## 2017-10-06 MED ORDER — NITROGLYCERIN 2 % TD OINT
TOPICAL_OINTMENT | TRANSDERMAL | Status: AC
  Filled 2017-10-06: qty 1

## 2017-10-06 MED ORDER — FOLIC ACID 1 MG PO TABS
0.5000 mg | ORAL_TABLET | Freq: Every day | ORAL | Status: DC
  Administered 2017-10-06 – 2017-10-09 (×4): 0.5 mg via ORAL
  Filled 2017-10-06 (×4): qty 1

## 2017-10-06 MED ORDER — FOLIC ACID 400 MCG PO TABS: 400.0000 ug | ORAL_TABLET | Freq: Every day | ORAL | Status: DC

## 2017-10-06 MED ORDER — ENSURE ENLIVE PO LIQD
237.0000 mL | Freq: Three times a day (TID) | ORAL | Status: DC
  Administered 2017-10-06 – 2017-10-09 (×4): 237 mL via ORAL

## 2017-10-06 MED ORDER — CEFTRIAXONE SODIUM IN DEXTROSE 20 MG/ML IV SOLN
1.0000 g | Freq: Once | INTRAVENOUS | Status: AC
  Administered 2017-10-06: 1 g via INTRAVENOUS
  Filled 2017-10-06: qty 50

## 2017-10-06 MED ORDER — ONDANSETRON HCL 4 MG/2ML IJ SOLN: 4.0000 mg | Freq: Four times a day (QID) | INTRAMUSCULAR | Status: DC | PRN

## 2017-10-06 MED ORDER — WARFARIN - PHARMACIST DOSING INPATIENT: Freq: Every day | Status: DC

## 2017-10-06 MED ORDER — TRIAMCINOLONE ACETONIDE 0.1 % EX CREA
1.0000 "application " | TOPICAL_CREAM | Freq: Two times a day (BID) | CUTANEOUS | Status: DC
  Administered 2017-10-06 – 2017-10-09 (×6): 1 via TOPICAL
  Filled 2017-10-06: qty 15

## 2017-10-06 MED ORDER — ACETAMINOPHEN 325 MG PO TABS: 650.0000 mg | ORAL_TABLET | ORAL | Status: DC | PRN

## 2017-10-06 MED ORDER — POLYETHYLENE GLYCOL 3350 17 G PO PACK
17.0000 g | PACK | Freq: Every day | ORAL | Status: DC
  Administered 2017-10-06 – 2017-10-09 (×4): 17 g via ORAL
  Filled 2017-10-06 (×4): qty 1

## 2017-10-06 MED ORDER — SODIUM CHLORIDE 0.9 % IV SOLN: 250.0000 mL | INTRAVENOUS | Status: DC | PRN

## 2017-10-06 MED ORDER — FUROSEMIDE 10 MG/ML IJ SOLN
40.0000 mg | Freq: Once | INTRAMUSCULAR | Status: AC
  Administered 2017-10-06: 40 mg via INTRAVENOUS
  Filled 2017-10-06: qty 4

## 2017-10-06 MED ORDER — SODIUM CHLORIDE 0.9% FLUSH
3.0000 mL | Freq: Two times a day (BID) | INTRAVENOUS | Status: DC
  Administered 2017-10-06 – 2017-10-09 (×7): 3 mL via INTRAVENOUS

## 2017-10-06 MED ORDER — ENSURE PO LIQD: 1.0000 | Freq: Three times a day (TID) | ORAL | Status: DC

## 2017-10-06 MED ORDER — ASPIRIN EC 81 MG PO TBEC
81.0000 mg | DELAYED_RELEASE_TABLET | Freq: Every day | ORAL | Status: DC
  Administered 2017-10-06 – 2017-10-09 (×4): 81 mg via ORAL
  Filled 2017-10-06 (×4): qty 1

## 2017-10-06 MED ORDER — NITROGLYCERIN 2 % TD OINT
1.0000 [in_us] | TOPICAL_OINTMENT | Freq: Once | TRANSDERMAL | Status: AC
  Administered 2017-10-06: 1 [in_us] via TOPICAL

## 2017-10-06 MED ORDER — ENOXAPARIN SODIUM 100 MG/ML ~~LOC~~ SOLN
1.0000 mg/kg | Freq: Two times a day (BID) | SUBCUTANEOUS | Status: DC
  Administered 2017-10-06 – 2017-10-09 (×6): 95 mg via SUBCUTANEOUS
  Filled 2017-10-06 (×7): qty 1

## 2017-10-06 MED ORDER — DOCUSATE SODIUM 100 MG PO CAPS
100.0000 mg | ORAL_CAPSULE | Freq: Two times a day (BID) | ORAL | Status: DC
  Administered 2017-10-06 – 2017-10-09 (×6): 100 mg via ORAL
  Filled 2017-10-06 (×6): qty 1

## 2017-10-06 MED ORDER — DEXTROSE 5 % IV SOLN
1.0000 g | INTRAVENOUS | Status: DC
  Administered 2017-10-07 – 2017-10-08 (×2): 1 g via INTRAVENOUS
  Filled 2017-10-06 (×2): qty 10

## 2017-10-06 MED ORDER — SODIUM CHLORIDE 0.9% FLUSH: 3.0000 mL | INTRAVENOUS | Status: DC | PRN

## 2017-10-06 MED ORDER — FUROSEMIDE 10 MG/ML IJ SOLN
20.0000 mg | Freq: Two times a day (BID) | INTRAMUSCULAR | Status: DC
Start: 2017-10-06 — End: 2017-10-08
  Administered 2017-10-06 – 2017-10-08 (×4): 20 mg via INTRAVENOUS
  Filled 2017-10-06 (×4): qty 2

## 2017-10-06 MED ORDER — HYDROXYUREA 500 MG PO CAPS
1500.0000 mg | ORAL_CAPSULE | ORAL | Status: DC
  Administered 2017-10-06 – 2017-10-08 (×2): 1500 mg via ORAL
  Filled 2017-10-06 (×2): qty 3

## 2017-10-06 MED ORDER — WARFARIN SODIUM 4 MG PO TABS
4.0000 mg | ORAL_TABLET | Freq: Once | ORAL | Status: AC
  Administered 2017-10-06: 4 mg via ORAL
  Filled 2017-10-06: qty 1

## 2017-10-06 MED ORDER — FUROSEMIDE 10 MG/ML IJ SOLN: 40.0000 mg | Freq: Two times a day (BID) | INTRAMUSCULAR | Status: DC

## 2017-10-06 NOTE — ED Provider Notes (Signed)
discussed with granddaughter who reports the patient is 6-7 pounds over her dry weight and was unable to sleep last night because she had to sit up straight to breathe.  Will admit to the hospitalist service for further management  Also reportedly the patient grew E. coli in her urine as an outpatient and was to be treated for a UTI but the medication is not been called them yet.  We will give her Rocephin here despite normal urinalysis   Lavonia Drafts, MD 10/06/17 [phone]

## 2017-10-06 NOTE — H&P (Signed)
[place] [facility] [place] NAME: Yolanda Wells    MR#:  [id number]  DATE OF BIRTH:  1931/12/03  DATE OF ADMISSION:  10/06/2017  PRIMARY CARE PHYSICIAN: [name], MD   REQUESTING/REFERRING PHYSICIAN: Corky Downs  CHIEF COMPLAINT:   Sob  HISTORY OF PRESENT ILLNESS:  Yolanda Wells  is a 83 y.o. female with a known history of pulmonary embolism, DVT on Coumadin, INR subtherapeutic, polycythemia vera, intermittent episodes of shortness of breath with all workup being negative is presenting to the ED with a chief complaint of shortness of breath.  Patient has gained 7 pounds of weight and unable to sleep from shortness of breath.  Patient is brought into the ED.  Also reportedly the patient grew E. coli in the urine and needs to be treated for that as per granddaughter's report to ED physician.  Patient reports that shortness of breath is slightly better since she got admitted.  Caregiver at bedside.  Denies any chest pain.  PAST MEDICAL HISTORY:   Past Medical History:  Diagnosis Date  . Acute deep vein thrombosis (DVT) of distal vein of left lower extremity ([place]) 10/03/2015  . Acute pulmonary embolism ([place]) 10/03/2015  . Anticoagulant long-term use   . Arthritis   . Chronic anticoagulation 10/03/2015  . Collagen vascular disease ([place])   . Colostomy in place [facility]) 10/04/2015  . Deep venous thrombosis (HCC)    right lower extremity  . Dependent edema   . Diverticulosis of intestine with bleeding 04/02/2015  . Glenohumeral arthritis 06/28/2012  . Hammertoe 09/02/2012  . History of bilateral hip replacements 09/02/2012  . History of hysterectomy   . Hypertension   . Hypokalemia   . Inability to ambulate due to hip 10/12/2015  . Mandibular dysfunction   . Onychomycosis 09/02/2012  . Osteoarthritis   . Polycythemia vera ([place])   . Presence of IVC filter 05/25/2015  . 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 Urinary incontinence in  female 09/02/2016    PAST SURGICAL HISTOIRY:   Past Surgical History:  Procedure Laterality Date  . ABDOMINAL HYSTERECTOMY    . BLEPHAROPLASTY Right 03/2017   right upper eyelid  . CARDIAC CATHETERIZATION Right 04/03/2015   Procedure: CENTRAL LINE INSERTION;  Surgeon: Sherri Rad, MD;  Location: [facility] ORS;  Service: General;  Laterality: Right;  . COLECTOMY WITH COLOSTOMY CREATION/HARTMANN PROCEDURE N/A 04/03/2015   Procedure: COLECTOMY WITH COLOSTOMY CREATION/HARTMANN PROCEDURE;  Surgeon: Sherri Rad, MD;  Location: [facility] ORS;  Service: General;  Laterality: N/A;  . FOOT AMPUTATION     partial  . FOOT AMPUTATION Right   . JOINT REPLACEMENT     Left and Right Hip  . PERIPHERAL VASCULAR CATHETERIZATION N/A 04/02/2015   Procedure: Visceral Angiography;  Surgeon: Algernon Huxley, MD;  Location: [place] [facility];  Service: Cardiovascular;  Laterality: N/A;  . PERIPHERAL VASCULAR CATHETERIZATION N/A 04/02/2015   Procedure: Visceral Artery Intervention;  Surgeon: Algernon Huxley, MD;  Location: [place] [facility];  Service: Cardiovascular;  Laterality: N/A;  . PERIPHERAL VASCULAR CATHETERIZATION N/A 05/25/2015   Procedure: IVC Filter Insertion;  Surgeon: Katha Cabal, MD;  Location: [place] [facility];  Service: Cardiovascular;  Laterality: N/A;  . TOE AMPUTATION     right  . TOTAL HIP ARTHROPLASTY      SOCIAL HISTORY:   Social History   Tobacco Use  . Smoking status: Never Smoker  . Smokeless tobacco: Never Used  Substance Use Topics  .  Alcohol use: No    FAMILY HISTORY:   Family History  Problem Relation Age of Onset  . Brain cancer Mother   . Heart attack Father   . COPD Father   . Coronary artery disease Father   . Hypertension Unknown   . Arthritis-Osteo Unknown   . Diabetes Brother   . Arthritis Brother   . Osteosarcoma Son   . Bone cancer Son   . Arthritis Sister   . Arthritis Brother   . Hypertension Brother   . Coronary artery disease Brother     DRUG  ALLERGIES:   Allergies  Allergen Reactions  . Sulfamethoxazole-Trimethoprim Nausea And Vomiting    REVIEW OF SYSTEMS:  CONSTITUTIONAL: No fever, fatigue or weakness.  EYES: No blurred or double vision.  EARS, NOSE, AND THROAT: No tinnitus or ear pain.  RESPIRATORY: No cough,  slightly improved shortness of breath, no wheezing or hemoptysis.  CARDIOVASCULAR: No chest pain, orthopnea, edema.  GASTROINTESTINAL: No nausea, vomiting, diarrhea or abdominal pain.  GENITOURINARY: No dysuria, hematuria.  ENDOCRINE: No polyuria, nocturia,  HEMATOLOGY: No anemia, easy bruising or bleeding SKIN: No rash or lesion. MUSCULOSKELETAL: No joint pain or arthritis.   NEUROLOGIC: No tingling, numbness, weakness.  PSYCHIATRY: No anxiety or depression.   MEDICATIONS AT HOME:   Prior to Admission medications   Medication Sig Start Date End Date Taking? Authorizing Provider  aspirin EC 81 MG tablet Take 1 tablet (81 mg total) by mouth daily. 09/16/15  Yes [name], MD  ENSURE (ENSURE) Take 1 Can by mouth 3 (three) times daily with meals.   Yes [provider]  folic acid (FOLVITE) 176 MCG tablet Take 400 mcg by mouth daily.   Yes [provider]  hydroxyurea (HYDREA) 500 MG capsule TAKE FOUR CAPSULES BY MOUTH DAILY Patient taking differently: Take 1,500 mg by mouth 4 (four) times a week. Tuesday,Thursday,Saturday,Sunday 09/14/17  Yes Corcoran, Drue Second, MD  potassium chloride (K-DUR,KLOR-CON) 10 MEQ tablet Take 10 mEq by mouth 2 (two) times a week. On Monday and Thursday  With lasix for potassium replacement   Yes [provider]  warfarin (COUMADIN) 1 MG tablet Take 0.5 tablet with a 3.5 mg tablet 4 days a week for total dose of 4 mg. Take 3.5 mg tablet 3 days a week until further notice 09/18/17  Yes Lequita Asal, MD  warfarin (COUMADIN) 3 MG tablet Take 1 tablet along with a 1 mg tablet for a total dose of 4 mg 4 days a week, take 3.5 mg 3 days a week 09/21/17   Yes Corcoran, Melissa C, MD  amLODipine (NORVASC) 5 MG tablet Take 1 tablet (5 mg total) by mouth daily. Patient not taking: Reported on 10/06/2017 05/26/17 05/26/18  [name], MD  docusate sodium (COLACE) 100 MG capsule Take 100 mg by mouth 2 (two) times daily.    [provider]  polyethylene glycol (MIRALAX / GLYCOLAX) packet Take 17 g by mouth daily.    [provider]  triamcinolone cream (KENALOG) 0.1 % Apply 1 application topically 2 (two) times daily. To affected area    [provider]      VITAL SIGNS:  Blood pressure (!) 148/76, pulse 70, temperature 98 F (36.7 C), resp. rate 16, height 5\' 7"  (1.702 m), weight 95.9 kg (211 lb 6.4 oz), SpO2 98 %.  PHYSICAL EXAMINATION:  GENERAL:  83 y.o.-year-old patient lying in the bed with no acute distress.  EYES: Pupils equal, round, reactive to light and  accommodation. No scleral icterus. Extraocular muscles intact.  HEENT: Head atraumatic, normocephalic. Oropharynx and nasopharynx clear.  NECK:  Supple, no jugular venous distention. No thyroid enlargement, no tenderness.  LUNGS: Moderately diminished breath sounds bilaterally, no wheezing, minimal bilateral basal rales,rhonchi , no crepitation. No use of accessory muscles of respiration.  CARDIOVASCULAR: S1, S2 normal. No murmurs, rubs, or gallops.  ABDOMEN: Soft, nontender, nondistended. Bowel sounds present. No organomegaly or mass.  EXTREMITIES: 2+ diffuse pedal edema, no cyanosis, or clubbing.  NEUROLOGIC: Cranial nerves II through XII are intact. Muscle strength at her baseline in all extremities. Sensation intact. Gait not checked.  PSYCHIATRIC: The patient is alert and oriented x 3.  SKIN: No obvious rash, lesion, or ulcer.   LABORATORY PANEL:   CBC Recent Labs  Lab 10/06/17 0636  WBC 2.1*  HGB 12.9  HCT 40.6  PLT 610*    ------------------------------------------------------------------------------------------------------------------  Chemistries  Recent Labs  Lab 10/06/17 0636  NA 137  K 4.8  CL 105  CO2 24  GLUCOSE 103*  BUN 12  CREATININE 0.52  CALCIUM 8.6*   ------------------------------------------------------------------------------------------------------------------  Cardiac Enzymes Recent Labs  Lab 10/06/17 0636  TROPONINI <0.03   ------------------------------------------------------------------------------------------------------------------  RADIOLOGY:  Dg Chest 2 View  Result Date: 10/06/2017 CLINICAL DATA:  Short of breath EXAM: CHEST  2 VIEW COMPARISON:  05/27/2017 FINDINGS: Pulmonary artery enlargement is chronic and unchanged. Negative for heart failure or edema. Negative for pleural effusion. Mild bibasilar atelectasis. No significant change from the prior study Advanced degenerative change in both shoulder joints. IMPRESSION: Pulmonary artery hypertension without heart failure Bibasilar atelectasis Electronically Signed   By: Franchot Gallo M.D.   On: 10/06/2017 07:21    EKG:   Orders placed or performed during the hospital encounter of 10/06/17  . ED EKG within 10 minutes  . ED EKG within 10 minutes  . EKG 12-Lead  . EKG 12-Lead    IMPRESSION AND PLAN:    #Acute respiratory failure possibly from new onset CHF / Pulmonary hypertension Admit to telemetry IV Lasix Patient had a history of chronic intermittent shortness of breath and workup was negative in the past Will get echocardiogram and consult cardiology Monitor daily weights, intake and output Chest x-ray with pulmonary hypertension consult pulmonology,  #Acute cystitis with abnormal urinalysis Urine culture and sensitivity and IV Rocephin   #History of DVT/pulmonary embolism On Coumadin INR is subtherapeutic will provide therapeutic dose of Lovenox until INR is therapeutic  #Chronic history of  polycythemia vera Outpatient follow-up with Dr. Mike Gip Continue home medication hydroxyurea     All the records are reviewed and case discussed with ED provider. Management plans discussed with the patient, caregiver Yolanda Wells at bedside and they are in agreement.  CODE STATUS: dnr , Yolanda Wells granddaughter is the healthcare power of attorney  TOTAL TIME TAKING CARE OF THIS PATIENT: 45 minutes.   Note: This dictation was prepared with Dragon dictation along with smaller phrase technology. Any transcriptional errors that result from this process are unintentional.  Nicholes Mango M.D on 10/06/2017 at 2:39 PM  Between 7am to 6pm - Pager - [phone]  After 6pm go to [email or web address] - password [email or web address] [facility]  Office  [phone]  CC: Primary care physician; [name], MD

## 2017-10-06 NOTE — ED Triage Notes (Signed)
Per EMS: patient c/o SOB, chest pain, and bilateral lower leg edema. Edema and fluid on lower right lung normal per patients granddaughter. Patient dx with UTI on Wednesday, has yet to fill medications.

## 2017-10-06 NOTE — ED Provider Notes (Signed)
[facility] Emergency Department Provider Note   ____________________________________________   First MD Initiated Contact with Patient 10/06/17 [phone]     (approximate)  I have reviewed the triage vital signs and the nursing notes.   HISTORY  Chief Complaint Shortness of Breath    HPI Yolanda Wells is a 83 y.o. female who comes into the hospital today with some breathing difficulty.  She has been having these symptoms since about midnight.  The patient does have a history of CHF and has been having some swelling in her legs.  The patient also has a history of a urinary tract infection but her prescription was never filled.  She has had breathing treatments every 2 hours since midnight but has not been helping.  The patient always has some fluid in her right lower lung and she states that she has been taking her medications.  She has had a mild cough with some mild chest pain.  The patient denies any nausea or vomiting and she does not use oxygen at home.  The patient has not had any fevers.  EMS states that the patient's blood pressure was 195/119.  She is here today for evaluation of her symptoms.   Past Medical History:  Diagnosis Date  . Acute deep vein thrombosis (DVT) of distal vein of left lower extremity ([place]) 10/03/2015  . Acute pulmonary embolism ([place]) 10/03/2015  . Anticoagulant long-term use   . Arthritis   . Chronic anticoagulation 10/03/2015  . Collagen vascular disease ([place])   . Colostomy in place [facility]) 10/04/2015  . Deep venous thrombosis (HCC)    right lower extremity  . Dependent edema   . Diverticulosis of intestine with bleeding 04/02/2015  . Glenohumeral arthritis 06/28/2012  . Hammertoe 09/02/2012  . History of bilateral hip replacements 09/02/2012  . History of hysterectomy   . Hypertension   . Hypokalemia   . Inability to ambulate due to hip 10/12/2015  . Mandibular dysfunction   . Onychomycosis 09/02/2012  . Osteoarthritis    . Polycythemia vera ([place])   . Presence of IVC filter 05/25/2015  . 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 Urinary incontinence in female 09/02/2016    Patient Active Problem List   Diagnosis Date Noted  . CHF (congestive heart failure) ([place]) 07/28/2017  . Dyspnea 05/27/2017  . Ileus ([place]) 04/10/2017  . Influenza with respiratory manifestation 12/01/2016  . Respiratory distress 11/23/2016  . Urinary incontinence in female 09/02/2016  . Vision loss of right eye 08/26/2016  . Blindness of right eye 08/26/2016  . Inability to ambulate due to hip 10/12/2015  . Colostomy in place [facility]) 10/04/2015  . Chronic anticoagulation 10/03/2015  . Acute pulmonary embolism ([place]) 10/03/2015  . Leukocytosis 09/25/2015  . Chest pain 09/25/2015  . COPD exacerbation ([place]) 09/24/2015  . CAP (community acquired pneumonia) 09/24/2015  . COPD (chronic obstructive pulmonary disease) ([place]) 09/24/2015  . SOB (shortness of breath) 09/22/2015  . Elevated troponin 09/15/2015  . UTI (urinary tract infection) 07/16/2015  . Abdominal wall cellulitis 07/15/2015  . DVT (deep venous thrombosis) ([place]) 07/15/2015  . HTN (hypertension) 07/15/2015  . Polycythemia vera ([place]) 07/15/2015  . Dependent edema 07/15/2015  . Arthritis 07/15/2015  . Pulmonary emboli ([place]) 05/25/2015  . Diverticulosis of colon with hemorrhage 04/02/2015  . Diverticulosis of intestine with bleeding 04/02/2015  . Dehydration, moderate 01/11/2015  . Fall 01/10/2015  . Osteoarthritis 01/10/2015  . Hammertoe 09/02/2012  . S/P transmetatarsal amputation of foot ([place]) 09/02/2012  .  Onychomycosis 09/02/2012  . Other specified dermatoses 09/02/2012  . Hip pain 08/23/2012  . S/P hip replacement 08/23/2012  . Glenohumeral arthritis 06/28/2012    Past Surgical History:  Procedure Laterality Date  . ABDOMINAL HYSTERECTOMY    . BLEPHAROPLASTY Right 03/2017   right upper eyelid  . CARDIAC CATHETERIZATION Right 04/03/2015   Procedure: CENTRAL  LINE INSERTION;  Surgeon: Sherri Rad, MD;  Location: [facility] ORS;  Service: General;  Laterality: Right;  . COLECTOMY WITH COLOSTOMY CREATION/HARTMANN PROCEDURE N/A 04/03/2015   Procedure: COLECTOMY WITH COLOSTOMY CREATION/HARTMANN PROCEDURE;  Surgeon: Sherri Rad, MD;  Location: [facility] ORS;  Service: General;  Laterality: N/A;  . FOOT AMPUTATION     partial  . FOOT AMPUTATION Right   . JOINT REPLACEMENT     Left and Right Hip  . PERIPHERAL VASCULAR CATHETERIZATION N/A 04/02/2015   Procedure: Visceral Angiography;  Surgeon: Algernon Huxley, MD;  Location: [place] [facility];  Service: Cardiovascular;  Laterality: N/A;  . PERIPHERAL VASCULAR CATHETERIZATION N/A 04/02/2015   Procedure: Visceral Artery Intervention;  Surgeon: Algernon Huxley, MD;  Location: [place] [facility];  Service: Cardiovascular;  Laterality: N/A;  . PERIPHERAL VASCULAR CATHETERIZATION N/A 05/25/2015   Procedure: IVC Filter Insertion;  Surgeon: Katha Cabal, MD;  Location: [place] [facility];  Service: Cardiovascular;  Laterality: N/A;  . TOE AMPUTATION     right  . TOTAL HIP ARTHROPLASTY      Prior to Admission medications   Medication Sig Start Date End Date Taking? Authorizing Provider  Amino Acids-Protein Hydrolys (FEEDING SUPPLEMENT, PRO-STAT SUGAR FREE 64,) LIQD Take 30 mLs by mouth 2 (two) times daily between meals.    [provider]  amLODipine (NORVASC) 5 MG tablet Take 1 tablet (5 mg total) by mouth daily. 05/26/17 05/26/18  [name], MD  aspirin EC 81 MG tablet Take 1 tablet (81 mg total) by mouth daily. 09/16/15   [name], MD  Cholecalciferol (VITAMIN D) 2000 units CAPS Take 6,000 Units by mouth daily. 3 caps 07/22/17 10/15/17  [provider]  docusate sodium (COLACE) 100 MG capsule Take 100 mg by mouth 2 (two) times daily.    [provider]  ENSURE (ENSURE) Take 1 Can by mouth 3 (three) times daily with meals.    [provider]  folic acid (FOLVITE) 417 MCG  tablet Take 400 mcg by mouth daily.    [provider]  furosemide (LASIX) 20 MG tablet Take 20 mg by mouth daily. On Monday and Thursday    [provider]  hydroxyurea (HYDREA) 500 MG capsule Take 1,500 mg by mouth See admin instructions. Take daily on Mon, Wed, Friday a 8 am. 3 capsules = 1500 mg May take with food to minimize GI side effects.    [provider]  hydroxyurea (HYDREA) 500 MG capsule Take 2,000 mg by mouth See admin instructions. Take daily 4 caps = 2000 mg on Sunday, Tuesday, Thursday and Saturday. May take with food to minimize GI side effects.    [provider]  hydroxyurea (HYDREA) 500 MG capsule TAKE FOUR CAPSULES BY MOUTH DAILY 09/14/17   Nolon Stalls C, MD  ipratropium-albuterol (DUONEB) 0.5-2.5 (3) MG/3ML SOLN Take 3 mLs by nebulization 4 (four) times daily.    [provider]  metoprolol tartrate (LOPRESSOR) 25 MG tablet Take 25 mg by mouth 2 (two) times daily.    [provider]  mirtazapine (REMERON) 7.5 MG tablet Take 15 mg by mouth at bedtime. 2  tabs    [provider]  polyethylene glycol (MIRALAX / GLYCOLAX) packet Take 17 g by mouth daily.    [provider]  potassium chloride (K-DUR,KLOR-CON) 10 MEQ tablet Take 10 mEq by mouth 2 (two) times a week. On Monday and Thursday  With lasix for potassium replacement    [provider]  triamcinolone cream (KENALOG) 0.1 % Apply 1 application topically 2 (two) times daily. To affected area    [provider]  warfarin (COUMADIN) 1 MG tablet Take 0.5 tablet with a 3.5 mg tablet 4 days a week for total dose of 4 mg. Take 3.5 mg tablet 3 days a week until further notice 09/18/17   Lequita Asal, MD  warfarin (COUMADIN) 2.5 MG tablet Take 2.5 mg by mouth daily. Take along with 1 mg tablet to equal 3.5 mg total    [provider]  warfarin (COUMADIN) 3 MG tablet Take 1 tablet along with a 1 mg tablet for a total dose of 4 mg  4 days a week, take 3.5 mg 3 days a week 09/21/17   Lequita Asal, MD    Allergies Sulfamethoxazole-trimethoprim  Family History  Problem Relation Age of Onset  . Brain cancer Mother   . Heart attack Father   . COPD Father   . Coronary artery disease Father   . Hypertension Unknown   . Arthritis-Osteo Unknown   . Diabetes Brother   . Arthritis Brother   . Osteosarcoma Son   . Bone cancer Son   . Arthritis Sister   . Arthritis Brother   . Hypertension Brother   . Coronary artery disease Brother     Social History Social History   Tobacco Use  . Smoking status: Never Smoker  . Smokeless tobacco: Never Used  Substance Use Topics  . Alcohol use: No  . Drug use: No    Review of Systems  Constitutional: No fever/chills Eyes: No visual changes. ENT: No sore throat. Cardiovascular:  chest pain. Respiratory:  shortness of breath. Gastrointestinal: No abdominal pain.  No nausea, no vomiting.  No diarrhea.  No constipation. Genitourinary: Negative for dysuria. Musculoskeletal: Negative for back pain. Skin: Negative for rash. Neurological: Negative for headaches, focal weakness or numbness.   ____________________________________________   PHYSICAL EXAM:  VITAL SIGNS: ED Triage Vitals  Enc Vitals Group     BP 10/06/17 0622 (!) 211/111     Pulse Rate 10/06/17 0622 73     Resp 10/06/17 0622 20     Temp 10/06/17 0622 98.5 F (36.9 C)     Temp Source 10/06/17 0622 Oral     SpO2 10/06/17 0620 99 %     Weight 10/06/17 0623 200 lb (90.7 kg)     Height --      Head Circumference --      Peak Flow --      Pain Score 10/06/17 0627 9     Pain Loc --      Pain Edu? --      Excl. in [place]? --     Constitutional: Alert and oriented. Well appearing and in moderate respiratory distress. Eyes: Conjunctivae are normal. PERRL. EOMI. Head: Atraumatic. Nose: No congestion/rhinnorhea. Mouth/Throat: Mucous membranes are moist.  Oropharynx  non-erythematous. Cardiovascular: Normal rate, regular rhythm. Grossly normal heart sounds.  Good peripheral circulation. Respiratory: Normal respiratory effort.  No retractions. Crackles in left base. Gastrointestinal: Soft and nontender. No distention.  Positive bowel sounds Musculoskeletal: No lower extremity tenderness nor edema.  Neurologic:  Normal speech and language.  Skin:  Skin is warm, dry and intact.  Psychiatric: Mood and affect are normal.   ____________________________________________   LABS (all labs ordered are listed, but only abnormal results are displayed)  Labs Reviewed  CBC WITH DIFFERENTIAL/PLATELET - Abnormal; Notable for the following components:      Result Value   WBC 2.1 (*)    MCHC 31.7 (*)    RDW 32.0 (*)    Platelets 610 (*)    Neutro Abs 0.9 (*)    Monocytes Absolute 0.0 (*)    All other components within normal limits  PROTIME-INR - Abnormal; Notable for the following components:   Prothrombin Time 16.9 (*)    All other components within normal limits  BASIC METABOLIC PANEL  TROPONIN I  BRAIN NATRIURETIC PEPTIDE   ____________________________________________  EKG  ED ECG REPORT I, Loney Hering, the attending physician, personally viewed and interpreted this ECG.   Date: 10/06/2017  EKG Time: 625  Rate: 70  Rhythm: normal sinus rhythm, RBBB  Axis: normal  Intervals:right bundle branch block  ST&T Change: none  ____________________________________________  RADIOLOGY  Dg Chest 2 View  Result Date: 10/06/2017 CLINICAL DATA:  Short of breath EXAM: CHEST  2 VIEW COMPARISON:  05/27/2017 FINDINGS: Pulmonary artery enlargement is chronic and unchanged. Negative for heart failure or edema. Negative for pleural effusion. Mild bibasilar atelectasis. No significant change from the prior study Advanced degenerative change in both shoulder joints. IMPRESSION: Pulmonary artery hypertension without heart failure Bibasilar atelectasis  Electronically Signed   By: Franchot Gallo M.D.   On: 10/06/2017 07:21    ____________________________________________   PROCEDURES  Procedure(s) performed: None  Procedures  Critical Care performed: No  ____________________________________________   INITIAL IMPRESSION / ASSESSMENT AND PLAN / ED COURSE  As part of my medical decision making, I reviewed the following data within the electronic MEDICAL RECORD NUMBER Notes from prior ED visits and [place] Controlled Substance Database   This is an 83 year old female with a history of COPD and CHF who comes into the hospital today with some shortness of breath.  She has had difficulty breathing for multiple hours.  The patient does have some elevated blood pressure as well as some pitting edema and some crackles in the left base.  My differential diagnosis includes CHF exacerbation, pneumonia, COPD.  I did place a Nitropaste on to the patient's chest.  I will check some blood work and a chest x-ray.  The patient has an allergy to Bactrim but it is nausea and vomiting so I will give her a dose of Lasix.  It does not appear that she takes Lasix regularly.  She will be reassessed.     The patient's CXR shows some pulmonary artery hypertension without heart failure. The patient's blood work is still pending. Her care will be signed out to Dr. Corky Downs who will follow up the results of her blood work and disposition the patient. ____________________________________________   FINAL CLINICAL IMPRESSION(S) / ED DIAGNOSES  Final diagnoses:  Shortness of breath     ED Discharge Orders    None       Note:  This document was prepared using Dragon voice recognition software and may include unintentional dictation errors.    Loney Hering, MD 10/06/17 [phone]

## 2017-10-06 NOTE — ED Notes (Signed)
Patient transported to X-ray 

## 2017-10-06 NOTE — Progress Notes (Signed)
ANTICOAGULATION CONSULT NOTE - Initial Consult  Pharmacy Consult for warfarin dosing  Indication: pulmonary embolus and DVT  Allergies  Allergen Reactions  . Sulfamethoxazole-Trimethoprim Nausea And Vomiting    Patient Measurements: Height: 5\' 7"  (170.2 cm) Weight: 211 lb 6.4 oz (95.9 kg) IBW/kg (Calculated) : 61.6   Vital Signs: Temp: 98 F (36.7 C) (11/27 1129) Temp Source: Oral (11/27 0622) BP: 148/76 (11/27 1129) Pulse Rate: 70 (11/27 1129)  Labs: Recent Labs    10/06/17 0636  HGB 12.9  HCT 40.6  PLT 610*  LABPROT 16.9*  INR 1.39  CREATININE 0.52  TROPONINI <0.03    Estimated Creatinine Clearance: 63.3 mL/min (by C-G formula based on SCr of 0.52 mg/dL).   Medical History: Past Medical History:  Diagnosis Date  . Acute deep vein thrombosis (DVT) of distal vein of left lower extremity ([place]) 10/03/2015  . Acute pulmonary embolism ([place]) 10/03/2015  . Anticoagulant long-term use   . Arthritis   . Chronic anticoagulation 10/03/2015  . Collagen vascular disease (Home)   . Colostomy in place [facility]) 10/04/2015  . Deep venous thrombosis (HCC)    right lower extremity  . Dependent edema   . Diverticulosis of intestine with bleeding 04/02/2015  . Glenohumeral arthritis 06/28/2012  . Hammertoe 09/02/2012  . History of bilateral hip replacements 09/02/2012  . History of hysterectomy   . Hypertension   . Hypokalemia   . Inability to ambulate due to hip 10/12/2015  . Mandibular dysfunction   . Onychomycosis 09/02/2012  . Osteoarthritis   . Polycythemia vera ([place])   . Presence of IVC filter 05/25/2015  . Stroke ([place]) 03/26/2015   cerebellar  . Urinary incontinence in female 09/02/2016    Medications:  Scheduled:  . aspirin EC  81 mg Oral Daily  . docusate sodium  100 mg Oral BID  . feeding supplement (ENSURE ENLIVE)  237 mL Oral TID BM  . folic acid  0.5 mg Oral Daily  . furosemide  20 mg Intravenous BID  . hydroxyurea  1,500 mg Oral Once per day on Sun  Tue Thu Sat  . polyethylene glycol  17 g Oral Daily  . sodium chloride flush  3 mL Intravenous Q12H  . triamcinolone cream  1 application Topical BID  . warfarin  4 mg Oral ONCE-1800  . Warfarin - Pharmacist Dosing Inpatient   Does not apply q1800   Infusions:  . sodium chloride    . [START ON 10/07/2017] cefTRIAXone (ROCEPHIN)  IV     PRN: sodium chloride, acetaminophen, ondansetron (ZOFRAN) IV, sodium chloride flush  Assessment: Patient baseline INR 1.39, pharmacy consulted for warfarin management of DVT/PE. Home dose warfarin 3.5mg  three days per week and warfarin 4mg  other days of the week. Adherence is not known. Will attempt to get patient into therapeutic range first with home dose of warfarin.   Goal of Therapy:  INR 2-3 Monitor platelets by anticoagulation protocol: Yes   Plan:  Warfarin 4mg  x 1 dose tonight, will check INR in am and make necessary adjustment.   Yolanda Wells 10/06/2017,12:41 PM

## 2017-10-07 LAB — BASIC METABOLIC PANEL
Anion gap: 7 (ref 5–15)
BUN: 11 mg/dL (ref 6–20)
CALCIUM: 8.4 mg/dL — AB (ref 8.9–10.3)
CHLORIDE: 102 mmol/L (ref 101–111)
CO2: 28 mmol/L (ref 22–32)
CREATININE: 0.42 mg/dL — AB (ref 0.44–1.00)
GFR calc Af Amer: >60 mL/min (ref >60)
GFR calc non Af Amer: >60 mL/min (ref >60)
GLUCOSE: 95 mg/dL (ref 65–99)
Potassium: 3.7 mmol/L (ref 3.5–5.1)
Sodium: 137 mmol/L (ref 135–145)

## 2017-10-07 LAB — PROTIME-INR
INR: 1.46
PROTHROMBIN TIME: 17.6 s — AB (ref 11.4–15.2)

## 2017-10-07 LAB — ECHOCARDIOGRAM COMPLETE
Height: 67 in
WEIGHTICAEL: 3382.4 [oz_av]

## 2017-10-07 MED ORDER — WARFARIN SODIUM 6 MG PO TABS
6.0000 mg | ORAL_TABLET | Freq: Once | ORAL | Status: AC
  Administered 2017-10-07: 6 mg via ORAL
  Filled 2017-10-07: qty 1

## 2017-10-07 MED ORDER — WARFARIN SODIUM 4 MG PO TABS
4.0000 mg | ORAL_TABLET | Freq: Every day | ORAL | Status: DC
  Administered 2017-10-08: 4 mg via ORAL
  Filled 2017-10-07 (×2): qty 1

## 2017-10-07 MED ORDER — WARFARIN SODIUM 4 MG PO TABS
4.0000 mg | ORAL_TABLET | Freq: Every day | ORAL | Status: DC
  Filled 2017-10-07: qty 1

## 2017-10-07 NOTE — Progress Notes (Signed)
[facility] [place] NAME: Yolanda Wells    MR#:  [id number]  DATE OF BIRTH:  04/03/1932  SUBJECTIVE:  CHIEF COMPLAINT:   Chief Complaint  Patient presents with  . Shortness of Breath     Came with SOB with exertion and sleep.   Have CHF, feels better after some diuresis.  REVIEW OF SYSTEMS:  CONSTITUTIONAL: No fever, fatigue or weakness.  EYES: No blurred or double vision.  EARS, NOSE, AND THROAT: No tinnitus or ear pain.  RESPIRATORY: No cough, shortness of breath, wheezing or hemoptysis.  CARDIOVASCULAR: No chest pain, orthopnea, edema.  GASTROINTESTINAL: No nausea, vomiting, diarrhea or abdominal pain.  GENITOURINARY: No dysuria, hematuria.  ENDOCRINE: No polyuria, nocturia,  HEMATOLOGY: No anemia, easy bruising or bleeding SKIN: No rash or lesion. MUSCULOSKELETAL: No joint pain or arthritis.   NEUROLOGIC: No tingling, numbness, weakness.  PSYCHIATRY: No anxiety or depression.   ROS  DRUG ALLERGIES:   Allergies  Allergen Reactions  . Sulfamethoxazole-Trimethoprim Nausea And Vomiting    VITALS:  Blood pressure 124/79, pulse 87, temperature 98 F (36.7 C), resp. rate 20, height 5\' 7"  (1.702 m), weight 95.2 kg (209 lb 14.4 oz), SpO2 100 %.  PHYSICAL EXAMINATION:  GENERAL:  83 y.o.-year-old patient lying in the bed with no acute distress.  EYES: Pupils equal, round, reactive to light and accommodation. No scleral icterus. Extraocular muscles intact.  HEENT: Head atraumatic, normocephalic. Oropharynx and nasopharynx clear.  NECK:  Supple, no jugular venous distention. No thyroid enlargement, no tenderness.  LUNGS: Normal breath sounds bilaterally, no wheezing, rales,rhonchi or crepitation. No use of accessory muscles of respiration.  CARDIOVASCULAR: S1, S2 normal. No murmurs, rubs, or gallops.  ABDOMEN: Soft, nontender, nondistended. Bowel sounds present. No organomegaly or mass.  EXTREMITIES: No pedal edema, cyanosis, or  clubbing.  NEUROLOGIC: Cranial nerves II through XII are intact. Muscle strength 5/5 in all extremities. Sensation intact. Gait not checked.  PSYCHIATRIC: The patient is alert and oriented x 3.  SKIN: No obvious rash, lesion, or ulcer.   Physical Exam LABORATORY PANEL:   CBC Recent Labs  Lab 10/06/17 0636  WBC 2.1*  HGB 12.9  HCT 40.6  PLT 610*   ------------------------------------------------------------------------------------------------------------------  Chemistries  Recent Labs  Lab 10/07/17 0512  NA 137  K 3.7  CL 102  CO2 28  GLUCOSE 95  BUN 11  CREATININE 0.42*  CALCIUM 8.4*   ------------------------------------------------------------------------------------------------------------------  Cardiac Enzymes Recent Labs  Lab 10/06/17 0636  TROPONINI <0.03   ------------------------------------------------------------------------------------------------------------------  RADIOLOGY:  Dg Chest 2 View  Result Date: 10/06/2017 CLINICAL DATA:  Short of breath EXAM: CHEST  2 VIEW COMPARISON:  05/27/2017 FINDINGS: Pulmonary artery enlargement is chronic and unchanged. Negative for heart failure or edema. Negative for pleural effusion. Mild bibasilar atelectasis. No significant change from the prior study Advanced degenerative change in both shoulder joints. IMPRESSION: Pulmonary artery hypertension without heart failure Bibasilar atelectasis Electronically Signed   By: Franchot Gallo M.D.   On: 10/06/2017 07:21    ASSESSMENT AND PLAN:   Active Problems:   Acute CHF (congestive heart failure) (HCC)  #Acute respiratory failure possibly from new onset CHF / Pulmonary hypertension  IV Lasix  get echocardiogram , IV lasix. Monitor daily weights, intake and output Chest x-ray with pulmonary hypertension consult pulmonology.  #Acute cystitis with abnormal urinalysis Urine culture and sensitivity and IV Rocephin   #History of DVT/pulmonary embolism On  Coumadin INR is subtherapeutic will provide therapeutic dose of Lovenox  until INR is therapeutic  #Chronic history of polycythemia vera Outpatient follow-up with Dr. Mike Gip Continue home medication hydroxyurea    All the records are reviewed and case discussed with Care Management/Social Workerr. Management plans discussed with the patient, family and they are in agreement.  CODE STATUS: DNR  TOTAL TIME TAKING CARE OF THIS PATIENT: 35 minutes.    POSSIBLE D/C IN 1-2 DAYS, DEPENDING ON CLINICAL CONDITION.   Vaughan Basta M.D on 10/07/2017   Between 7am to 6pm - Pager - [phone]  After 6pm go to [email or web address] - password [email or web address] [facility]  Office  [phone]  CC: Primary care physician; [name], MD  Note: This dictation was prepared with Dragon dictation along with smaller phrase technology. Any transcriptional errors that result from this process are unintentional.

## 2017-10-07 NOTE — Progress Notes (Addendum)
Date: 10/07/2017,   MRN# [id number] Yolanda Wells 26-Feb-1932 Code Status:     Code Status Orders  (From admission, onward)        Start     Ordered   10/06/17 1454  Do not attempt resuscitation (DNR)  Continuous    Question Answer Comment  In the event of cardiac or respiratory ARREST Do not call a "code blue"   In the event of cardiac or respiratory ARREST Do not perform Intubation, CPR, defibrillation or ACLS   In the event of cardiac or respiratory ARREST Use medication by any route, position, wound care, and other measures to relive pain and suffering. May use oxygen, suction and manual treatment of airway obstruction as needed for comfort.   Comments RN may pronounce      10/06/17 1454    Code Status History    Date Active Date Inactive Code Status Order ID Comments User Context   10/06/2017 11:11 10/06/2017 14:53 Full Code [id number]  Nicholes Mango, MD Inpatient   05/27/2017 19:54 05/28/2017 20:13 Full Code [id number]  Vaughan Basta, MD Inpatient   05/23/2017 23:22 05/26/2017 16:14 Full Code [id number]  Lance Coon, MD Inpatient   04/10/2017 03:25 04/11/2017 19:16 Full Code [id number]  Harrie Foreman, MD Inpatient   11/23/2016 06:29 12/01/2016 19:35 Full Code [id number]  Harrie Foreman, MD Inpatient   08/26/2016 04:45 08/26/2016 19:23 Full Code [id number]  Saundra Shelling, MD Inpatient   09/22/2015 17:55 09/25/2015 18:44 Full Code [id number]  Demetrios Loll, MD ED   09/15/2015 03:41 09/17/2015 17:41 Full Code [id number]  Harrie Foreman, MD Inpatient   07/16/2015 01:51 07/20/2015 23:34 Full Code [id number]  Lance Coon, MD Inpatient   05/25/2015 08:34 05/29/2015 16:16 Full Code [id number]  Harrie Foreman, MD Inpatient   04/02/2015 06:06 04/08/2015 16:51 Full Code [id number]  Harrie Foreman, MD Inpatient    Advance Directive Documentation     Most Recent Value  Type of Advance Directive  Healthcare Power of [place], Living will  Pre-existing out of facility DNR order (yellow form  or pink MOST form)  No data  "MOST" Form in Place?  No data     Hosp day:@LENGTHOFSTAYDAYS @ Referring MD: @ATDPROV @      CC: Enlarged pulmonary artery on cxr, asked to see regarding ?? Pulmonary hypertension  HPI: This is an 83 yr old lady, DNR, presents with another bout of shortness of breath. She is over weight, s/p pulmonary embolism/ dvt, on coumidin/ IVC placed. (subtherapeutic on admission).  Polycythemia vera.  On cxr pulmonary arteries look enlarge. Raising the question of pulmonary hypertension.   Her echo showed normal right ventricle size and function. Tricupid valve was not visualized efficiently.   PMHX:   Past Medical History:  Diagnosis Date  . Acute deep vein thrombosis (DVT) of distal vein of left lower extremity ([place]) 10/03/2015  . Acute pulmonary embolism ([place]) 10/03/2015  . Anticoagulant long-term use   . Arthritis   . Chronic anticoagulation 10/03/2015  . Collagen vascular disease ([place])   . Colostomy in place [facility]) 10/04/2015  . Deep venous thrombosis (HCC)    right lower extremity  . Dependent edema   . Diverticulosis of intestine with bleeding 04/02/2015  . Glenohumeral arthritis 06/28/2012  . Hammertoe 09/02/2012  . History of bilateral hip replacements 09/02/2012  . History of hysterectomy   . Hypertension   . Hypokalemia   . Inability to ambulate due to hip 10/12/2015  . Mandibular dysfunction   . Onychomycosis 09/02/2012  .  Osteoarthritis   . Polycythemia vera ([place])   . Presence of IVC filter 05/25/2015  . Stroke ([place]) 03/26/2015   cerebellar  . Urinary incontinence in female 09/02/2016   Surgical Hx:  Past Surgical History:  Procedure Laterality Date  . ABDOMINAL HYSTERECTOMY    . BLEPHAROPLASTY Right 03/2017   right upper eyelid  . CARDIAC CATHETERIZATION Right 04/03/2015   Procedure: CENTRAL LINE INSERTION;  Surgeon: Sherri Rad, MD;  Location: [facility] ORS;  Service: General;  Laterality: Right;  . COLECTOMY WITH COLOSTOMY CREATION/HARTMANN  PROCEDURE N/A 04/03/2015   Procedure: COLECTOMY WITH COLOSTOMY CREATION/HARTMANN PROCEDURE;  Surgeon: Sherri Rad, MD;  Location: [facility] ORS;  Service: General;  Laterality: N/A;  . FOOT AMPUTATION     partial  . FOOT AMPUTATION Right   . JOINT REPLACEMENT     Left and Right Hip  . PERIPHERAL VASCULAR CATHETERIZATION N/A 04/02/2015   Procedure: Visceral Angiography;  Surgeon: Algernon Huxley, MD;  Location: [place] CV LAB;  Service: Cardiovascular;  Laterality: N/A;  . PERIPHERAL VASCULAR CATHETERIZATION N/A 04/02/2015   Procedure: Visceral Artery Intervention;  Surgeon: Algernon Huxley, MD;  Location: [place] CV LAB;  Service: Cardiovascular;  Laterality: N/A;  . PERIPHERAL VASCULAR CATHETERIZATION N/A 05/25/2015   Procedure: IVC Filter Insertion;  Surgeon: Katha Cabal, MD;  Location: [place] CV LAB;  Service: Cardiovascular;  Laterality: N/A;  . TOE AMPUTATION     right  . TOTAL HIP ARTHROPLASTY     Family Hx:  Family History  Problem Relation Age of Onset  . Brain cancer Mother   . Heart attack Father   . COPD Father   . Coronary artery disease Father   . Hypertension Unknown   . Arthritis-Osteo Unknown   . Diabetes Brother   . Arthritis Brother   . Osteosarcoma Son   . Bone cancer Son   . Arthritis Sister   . Arthritis Brother   . Hypertension Brother   . Coronary artery disease Brother    Social Hx:   Social History   Tobacco Use  . Smoking status: Never Smoker  . Smokeless tobacco: Never Used  Substance Use Topics  . Alcohol use: No  . Drug use: No   Medication:    Home Medication:    Current Medication: @CURMEDTAB @   Allergies:  Sulfamethoxazole-trimethoprim  Review of Systems: Gen:  Denies  fever, sweats, chills HEENT: Denies blurred vision, double vision, ear pain, eye pain, hearing loss, nose bleeds, sore throat Cvc:  No dizziness, chest pain or heaviness Resp: less sob. No wheezing.    Gi: Denies swallowing difficulty, stomach pain,  nausea or vomiting, diarrhea, constipation, bowel incontinence Gu:  Denies bladder incontinence, burning urine Ext:   No Joint pain, stiffness or swelling Skin: No skin rash, easy bruising or bleeding or hives Endoc:  No polyuria, polydipsia , polyphagia or weight change Psych: No depression, insomnia or hallucinations  Other:  All other systems negative  Physical Examination:   VS: BP (!) 147/81 (BP Location: Right Arm)   Pulse 77   Temp 98.2 F (36.8 C)   Resp 18   Ht 5\' 7"  (1.702 m)   Wt 209 lb 14.4 oz (95.2 kg)   SpO2 97%   BMI 32.87 kg/m   General Appearance: No distress, speaking in full sentences  Neuro: without focal findings, mental status, speech normal, alert and oriented, cranial nerves 2-12 intact, reflexes normal and symmetric, sensation grossly normal  HEENT: PERRLA, EOM intact, no ptosis, no  other lesions noticed NECK: Supple, no stridor Pulmonary:.No wheezing, No rales     Cardiovascular:  Normal S1,S2.  No m/r/g.  Abdominal aorta pulsation normal.    Abdomen:Benign, Soft, non-tender, No masses, hepatosplenomegaly, No lymphadenopathy Endoc: No evident thyromegaly, no signs of acromegaly or Cushing features Skin:   warm, no rashes, no ecchymosis  Extremities: normal, no cyanosis, clubbing, no edema, warm with normal capillary refill. Other findings:   Labs results:   Recent Labs    10/06/17 0636 10/07/17 0512  HGB 12.9  --   HCT 40.6  --   MCV 82.4  --   WBC 2.1*  --   BUN 12 11  CREATININE 0.52 0.42*  GLUCOSE 103* 95  CALCIUM 8.6* 8.4*  INR 1.39 1.46  ,  Rad results:  FINDINGS: Pulmonary artery enlargement is chronic and unchanged. Negative for heart failure or edema. Negative for pleural effusion. Mild bibasilar atelectasis. No significant change from the prior study  Advanced degenerative change in both shoulder joints.  IMPRESSION: Pulmonary artery hypertension without heart failure  Bibasilar atelectasis  Electronically Signed    By: Franchot Gallo M.D.   On: 10/06/2017 07:21  Echo  LV EF: 60% -   65%  ------------------------------------------------------------------- Indications:      TIA 435.9.  ------------------------------------------------------------------- History:   PMH:  History of hysterectomy, dependent edema, DVT, collagen vascular disease.  Stroke.  Risk factors:  Hypertension.   ------------------------------------------------------------------- Study Conclusions  - Left ventricle: Wall thickness was increased in a pattern of   moderate LVH. Systolic function was normal. The estimated   ejection fraction was in the range of 60% to 65%. Doppler   parameters are consistent with abnormal left ventricular   relaxation (grade 1 diastolic dysfunction). - Aortic valve: Valve area (Vmax): 2.6 cm^2. - Mitral valve: Calcified annulus. Mildly thickened leaflets .    Result Date: 09/16/2017, CHEST CT SCAN EXAM: CTA Chest for Pulmonary Embolus DATE: 09/15/2017 5:33 PM ACCESSION: [id number] UN DICTATED: 09/15/2017 5:39 PM INTERPRETATION LOCATION: [place] CLINICAL INDICATION: 83 years old Female with History of PE w/ asymmetric LE swelling and SOB-- COMPARISON: 09/15/2017 CXR and earlier exams TECHNIQUE: A spiral CTA scan was obtained with IV contrast from the thoracic inlet through the hemidiaphragms. Images were reconstructed in the axial plane. Multiplanar reformatted and MIP images are provided for further evaluation of the pulmonary vasculature. FINDINGS: LOWER NECK: Heterogeneous calcified thyroid nodule, 2.4 cm, is unchanged from 05/29/2017. CENTRAL AIRWAYS: Unremarkable. MEDIASTINUM: There are no pathologically enlarged mediastinal or hilar lymph nodes. Cardiomegaly. LUNGS: Mild left basilar atelectasis and associated bronchial wall thickening, similar to prior. Right hemidiaphragmatic elevation and right lower lobe atelectasis is unchanged PLEURA: Unremarkable. No effusion. PULMONARY VASCULATURE: There  are no filling defects within the pulmonary arterial tree to suggest a pulmonary embolism. BONES:Multilevel vertebral degenerative change. Severe bilateral shoulder osteoarthrosis. SOFT TISSUE: Unremarkable UPPER ABDOMEN: Small hiatal hernia.   --No CT evidence of acute pulmonary embolism. --Enlargement of the main pulmonary arteries, suggestive of pulmonary hypertension. -- Mild left lower lobe bronchitis could be reactive, infectious, or related to aspiration. --Cardiomegaly. --Large heterogeneous thyroid nodule, unchanged from 05/29/2017. Dedicated thyroid ultrasound is again recommended for further evaluation.    Assessment and Plan: There is the question,  does she really have pulmonary hypertension. Base  on the totality of the findings I can not rule in pulmonary hypertension at this time( echo is not supportive, the chest ct practically  rules out CEPTH). At present not eager to get right heart cath  unless cardiology is considering left heart cath   Her sob is multifactorial: weight, anxiety, ? diastolic chf, atelectasis, elevated diaphragm ? Paralysis. Doubt copd ( never smoked)    Check bnp, ana Evaluate for sleep apnea Continue anticoagulation Cardiology consult, ? Contribution from the heart If need be consider sniff test  Incentive spirometry Will reasess tommorrow.    Thanks for the referral   I have personally obtained a history, examined the patient, evaluated laboratory and imaing results, formulated the assessment and plan and placed orders.  The Patient requires high complexity decision making for assessment and support, frequent evaluation and titration of therapies, application of advanced monitoring technologies and extensive interpretation of multiple databases.   Bernis Schreur,M.D. Pulmonary Medicine, board certified [facility]

## 2017-10-07 NOTE — Care Management Note (Signed)
Case Management Note  Patient Details  Name: Darlisa Spruiell MRN: [id number] Date of Birth: [birth year]  Subjective/Objective:                 Patient is admitted with shortness of breath concerning for heart failure.  Has history of pulmonary hypertension.  Lives with her granddaughter and has some in home caregiver assit.  Patient is incontinent and uses depends at home.  Uses a wheelchair to get around the home.  is able to assist with pivot transfers.  Chronic coumadin and INR subtherapeutic. On room air.  Being treated for UTI that was diagnosed prior to admission but sound like did not start taking medication to treat it.   Patient's brother died within the last few days and the funeral is today.   Action/Plan:  Anticipate return home Will speak with granddaughter to clarify reason not taking medication for uti and low INR.    Expected Discharge Date:                  Expected Discharge Plan:     In-House Referral:     Discharge planning Services     Post Acute Care Choice:    Choice offered to:     DME Arranged:    DME Agency:     HH Arranged:    HH Agency:     Status of Service:     If discussed at [employer], dates discussed:    Additional Comments:  Katrina Stack, RN 10/07/2017, 5:27 PM

## 2017-10-07 NOTE — Progress Notes (Signed)
ANTICOAGULATION CONSULT NOTE - Initial Consult  Pharmacy Consult for warfarin dosing  Indication: pulmonary embolus and DVT  Allergies  Allergen Reactions  . Sulfamethoxazole-Trimethoprim Nausea And Vomiting    Patient Measurements: Height: 5\' 7"  (170.2 cm) Weight: 209 lb 14.4 oz (95.2 kg) IBW/kg (Calculated) : 61.6   Vital Signs: Temp: 98.2 F (36.8 C) (11/28 0751) Temp Source: Oral (11/28 0400) BP: 147/81 (11/28 0751) Pulse Rate: 77 (11/28 0751)  Labs: Recent Labs    10/06/17 0636 10/07/17 0512  HGB 12.9  --   HCT 40.6  --   PLT 610*  --   LABPROT 16.9* 17.6*  INR 1.39 1.46  CREATININE 0.52 0.42*  TROPONINI <0.03  --     Estimated Creatinine Clearance: 63.1 mL/min (A) (by C-G formula based on SCr of 0.42 mg/dL (L)).   Medical History: Past Medical History:  Diagnosis Date  . Acute deep vein thrombosis (DVT) of distal vein of left lower extremity ([place]) 10/03/2015  . Acute pulmonary embolism ([place]) 10/03/2015  . Anticoagulant long-term use   . Arthritis   . Chronic anticoagulation 10/03/2015  . Collagen vascular disease ([place])   . Colostomy in place [facility]) 10/04/2015  . Deep venous thrombosis (HCC)    right lower extremity  . Dependent edema   . Diverticulosis of intestine with bleeding 04/02/2015  . Glenohumeral arthritis 06/28/2012  . Hammertoe 09/02/2012  . History of bilateral hip replacements 09/02/2012  . History of hysterectomy   . Hypertension   . Hypokalemia   . Inability to ambulate due to hip 10/12/2015  . Mandibular dysfunction   . Onychomycosis 09/02/2012  . Osteoarthritis   . Polycythemia vera ([place])   . Presence of IVC filter 05/25/2015  . 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 Urinary incontinence in female 09/02/2016    Medications:  Scheduled:  . aspirin EC  81 mg Oral Daily  . docusate sodium  100 mg Oral BID  . enoxaparin (LOVENOX) injection  1 mg/kg Subcutaneous Q12H  . feeding supplement (ENSURE ENLIVE)  237 mL Oral TID BM   . folic acid  0.5 mg Oral Daily  . furosemide  20 mg Intravenous BID  . hydroxyurea  1,500 mg Oral Once per day on Sun Tue Thu Sat  . polyethylene glycol  17 g Oral Daily  . sodium chloride flush  3 mL Intravenous Q12H  . triamcinolone cream  1 application Topical BID  . warfarin  4 mg Oral q1800  . Warfarin - Pharmacist Dosing Inpatient   Does not apply q1800   Infusions:  . sodium chloride    . cefTRIAXone (ROCEPHIN)  IV 1 g (10/07/17 0941)   PRN: sodium chloride, acetaminophen, ondansetron (ZOFRAN) IV, sodium chloride flush  Assessment: Patient baseline INR 1.39, pharmacy consulted for warfarin management of DVT/PE. Home dose warfarin 3.5mg  three days per week and warfarin 4mg  other days of the week. Adherence is not known. Pt INR was low on 11/9 and dose was increased to current dose. INR on admission was 1.39. Up to 1.46 this AM  Goal of Therapy:  INR 2-3 Monitor platelets by anticoagulation protocol: Yes   Plan:  Will give 6mg  tonight and increase dose to 4mg  daily. Recheck INR in the AM   Teila Skalsky D Lamiah Marmol 10/07/2017,10:14 AM

## 2017-10-08 LAB — URINE CULTURE

## 2017-10-08 LAB — CBC
HCT: 38.3 % (ref 35.0–47.0)
HEMOGLOBIN: 12.3 g/dL (ref 12.0–16.0)
MCH: 26.3 pg (ref 26.0–34.0)
MCHC: 32.1 g/dL (ref 32.0–36.0)
MCV: 82 fL (ref 80.0–100.0)
PLATELETS: 536 10*3/uL — AB (ref 150–440)
RBC: 4.67 MIL/uL (ref 3.80–5.20)
RDW: 31.4 % — ABNORMAL HIGH (ref 11.5–14.5)
WBC: 1.9 10*3/uL — AB (ref 3.6–11.0)

## 2017-10-08 LAB — BASIC METABOLIC PANEL
ANION GAP: 6 (ref 5–15)
BUN: 10 mg/dL (ref 6–20)
CHLORIDE: 103 mmol/L (ref 101–111)
CO2: 29 mmol/L (ref 22–32)
Calcium: 8.6 mg/dL — ABNORMAL LOW (ref 8.9–10.3)
Creatinine, Ser: 0.55 mg/dL (ref 0.44–1.00)
Glucose, Bld: 101 mg/dL — ABNORMAL HIGH (ref 65–99)
POTASSIUM: 3.6 mmol/L (ref 3.5–5.1)
SODIUM: 138 mmol/L (ref 135–145)

## 2017-10-08 LAB — PROTIME-INR
INR: 1.66
PROTHROMBIN TIME: 19.5 s — AB (ref 11.4–15.2)

## 2017-10-08 MED ORDER — CEPHALEXIN 500 MG PO CAPS
500.0000 mg | ORAL_CAPSULE | Freq: Two times a day (BID) | ORAL | Status: DC
  Administered 2017-10-09: 500 mg via ORAL
  Filled 2017-10-08: qty 1

## 2017-10-08 MED ORDER — FUROSEMIDE 20 MG PO TABS
20.0000 mg | ORAL_TABLET | Freq: Two times a day (BID) | ORAL | Status: DC
  Administered 2017-10-08 – 2017-10-09 (×2): 20 mg via ORAL
  Filled 2017-10-08 (×2): qty 1

## 2017-10-08 MED ORDER — PREMIER PROTEIN SHAKE
11.0000 [oz_av] | Freq: Two times a day (BID) | ORAL | Status: DC
  Administered 2017-10-08 – 2017-10-09 (×3): 11 [oz_av] via ORAL

## 2017-10-08 NOTE — Progress Notes (Signed)
ANTICOAGULATION CONSULT NOTE - Initial Consult  Pharmacy Consult for warfarin dosing  Indication: pulmonary embolus and DVT  Allergies  Allergen Reactions  . Sulfamethoxazole-Trimethoprim Nausea And Vomiting    Patient Measurements: Height: 5\' 7"  (170.2 cm) Weight: 207 lb 3.2 oz (94 kg) IBW/kg (Calculated) : 61.6   Vital Signs: Temp: 97.9 F (36.6 C) (11/29 0336) BP: 131/71 (11/29 0336) Pulse Rate: 87 (11/29 0336)  Labs: Recent Labs    10/06/17 0636 10/07/17 0512 10/08/17 0558  HGB 12.9  --   --   HCT 40.6  --   --   PLT 610*  --   --   LABPROT 16.9* 17.6* 19.5*  INR 1.39 1.46 1.66  CREATININE 0.52 0.42* 0.55  TROPONINI <0.03  --   --     Estimated Creatinine Clearance: 62.7 mL/min (by C-G formula based on SCr of 0.55 mg/dL).   Medical History: Past Medical History:  Diagnosis Date  . Acute deep vein thrombosis (DVT) of distal vein of left lower extremity ([place]) 10/03/2015  . Acute pulmonary embolism ([place]) 10/03/2015  . Anticoagulant long-term use   . Arthritis   . Chronic anticoagulation 10/03/2015  . Collagen vascular disease ([place])   . Colostomy in place [facility]) 10/04/2015  . Deep venous thrombosis (HCC)    right lower extremity  . Dependent edema   . Diverticulosis of intestine with bleeding 04/02/2015  . Glenohumeral arthritis 06/28/2012  . Hammertoe 09/02/2012  . History of bilateral hip replacements 09/02/2012  . History of hysterectomy   . Hypertension   . Hypokalemia   . Inability to ambulate due to hip 10/12/2015  . Mandibular dysfunction   . Onychomycosis 09/02/2012  . Osteoarthritis   . Polycythemia vera ([place])   . Presence of IVC filter 05/25/2015  . Stroke ([place]) 03/26/2015   cerebellar  . Urinary incontinence in female 09/02/2016    Medications:  Scheduled:  . aspirin EC  81 mg Oral Daily  . docusate sodium  100 mg Oral BID  . enoxaparin (LOVENOX) injection  1 mg/kg Subcutaneous Q12H  . feeding supplement (ENSURE ENLIVE)  237 mL  Oral TID BM  . folic acid  0.5 mg Oral Daily  . furosemide  20 mg Intravenous BID  . hydroxyurea  1,500 mg Oral Once per day on Sun Tue Thu Sat  . polyethylene glycol  17 g Oral Daily  . sodium chloride flush  3 mL Intravenous Q12H  . triamcinolone cream  1 application Topical BID  . warfarin  4 mg Oral q1800  . Warfarin - Pharmacist Dosing Inpatient   Does not apply q1800   Infusions:  . sodium chloride    . cefTRIAXone (ROCEPHIN)  IV Stopped (10/07/17 1109)   PRN: sodium chloride, acetaminophen, ondansetron (ZOFRAN) IV, sodium chloride flush  Assessment: Patient baseline INR 1.39, pharmacy consulted for warfarin management of DVT/PE. Home dose warfarin 3.5mg  three days per week and warfarin 4mg  other days of the week. Adherence is not known. Pt INR was low on 11/9 and dose was increased to current dose. INR on admission was 1.39. Up to 1.46 this AM  Goal of Therapy:  INR 2-3 Monitor platelets by anticoagulation protocol: Yes   Plan:  INR coming up. Continue new dose of 4mg  daily. Recheck INR in the AM. Continue therapeutic lovenox 1mg /kg q 12 hr until INR is therapeutic  [name] [name], Pharm.D, BCPS Clinical Pharmacist  10/08/2017,7:30 AM

## 2017-10-08 NOTE — Evaluation (Signed)
Physical Therapy Evaluation Patient Details Name: Yolanda Wells MRN: [id number] DOB: 1932-01-13 Today's Date: 10/08/2017   History of Present Illness  Pt is an 83 y.o. female presenting to hospital with SOB, LE swelling, recent h/o UTI, and recent 7# weight gain.  Pt admitted with acute respiratory failure (possibly from new onset CHF/pulmonary htn), and acute cystitis with abnormal urinalyisis.  PMH includes blindness R eye, colostomy, PE, COPD, CHF, DVT, B hip replacements, stroke, R TMA.  Clinical Impression  Prior to hospital admission, pt reports requiring some assist with bed mobility and transfers to w/c (pt non-ambulatory >2 years); pt reports propelling self in manual w/c at home.  Pt lives with granddaughter in 1 level apt (level entry) and has caregiver assist; also pt reports receiving HHPT.  Currently pt is mod assist supine to sit and min assist with transfer bed to w/c using RW.  Pt appears to be close to her recent self-reported baseline level of functional mobility.  Pt would benefit from skilled PT to address noted impairments and functional limitations (see below for any additional details).  Upon hospital discharge, recommend pt discharge to home with 24/7 assist and continued HHPT services.    Follow Up Recommendations Home health PT;Supervision/Assistance - 24 hour    Equipment Recommendations  Rolling walker with 5" wheels;Wheelchair (measurements PT);Hospital bed((pt already has this equipment at home))    Recommendations for Other Services       Precautions / Restrictions Precautions Precautions: Fall Precaution Comments: Colostomy Restrictions Weight Bearing Restrictions: No      Mobility  Bed Mobility Overal bed mobility: Needs Assistance Bed Mobility: Supine to Sit;Rolling Rolling: Min assist   Supine to sit: Mod assist     General bed mobility comments: assist for LE's initially and then pt requests to pull up (with R UE) holding onto therapists hand  (as she does at home)  Transfers Overall transfer level: Needs assistance Equipment used: Rolling walker (2 wheeled) Transfers: Sit to/from [facility] Sit to Stand: Mod assist;Min assist Stand pivot transfers: Min assist       General transfer comment: pt initially mod assist to stand with RW from bed (L LE sliding forward requiring increased assist so pt assisted back into sitting); 2nd trial L foot blocked (from sliding forward) and pt able to stand with min assist of therapist; pt performed stand step turn with RW min assist to steady and mild increased time/effort to perform  Ambulation/Gait             General Gait Details: Deferred (pt non-ambulatory baseline)  Stairs            Wheelchair Mobility    Modified Rankin (Stroke Patients Only)       Balance Overall balance assessment: Needs assistance Sitting-balance support: Bilateral upper extremity supported;Feet supported Sitting balance-Leahy Scale: Poor Sitting balance - Comments: requires UE support for sitting balance   Standing balance support: Bilateral upper extremity supported(on RW) Standing balance-Leahy Scale: Poor Standing balance comment: requires UE support on RW for static standing balance (CGA for safety)                             Pertinent Vitals/Pain Pain Assessment: No/denies pain    Home Living Family/patient expects to be discharged to:: Private residence Living Arrangements: Other relatives(Granddaughter) Available Help at Discharge: Family;Personal care attendant;Available PRN/intermittently Type of Home: Apartment Home Access: Level entry     Home Layout: One level  Home Equipment: [place] - 2 wheels;Hospital bed Additional Comments: Caregiver M-F 9-11am    Prior Function Level of Independence: Needs assistance   Gait / Transfers Assistance Needed: Pt performs pivot transfer with assist and RW from Bed<>WC.  Pt has not ambulated in > 2 years.   Pt denies having any recent falls.  She has been working with [employer].  ADL's / Homemaking Assistance Needed: Caregiver assists with a sponge bath, dressing, brushing her hair.  Daughter does the drving, cooking, cleaning.  Pt independent with feeding.          Hand Dominance   Dominant Hand: Right    Extremity/Trunk Assessment   Upper Extremity Assessment Upper Extremity Assessment: Generalized weakness    Lower Extremity Assessment Lower Extremity Assessment: Generalized weakness(Limited L knee flexion ROM (to grossly 70-80 degrees); increased difficulty moving L LE (at least 3-/5 hip flexion; 3/5 knee flexion/extension and DF/PF); R LE at least 3/5 hip flexion, knee flexion/extension, and DF)       Communication   Communication: No difficulties  Cognition Arousal/Alertness: Awake/alert Behavior During Therapy: WFL for tasks assessed/performed Overall Cognitive Status: Within Functional Limits for tasks assessed                                        General Comments General comments (skin integrity, edema, etc.): Pt resting in bed upon PT entry.  Nursing present during session and removed external catheter.  Discussed bedrest with bathroom privileges order with nursing and nursing cleared PT to get pt up to chair.    Exercises     Assessment/Plan    PT Assessment Patient needs continued PT services  PT Problem List Decreased strength;Decreased balance;Decreased mobility       PT Treatment Interventions DME instruction;Functional mobility training;Therapeutic activities;Therapeutic exercise;Balance training;Patient/family education    PT Goals (Current goals can be found in the Care Plan section)  Acute Rehab PT Goals Patient Stated Goal: to go home PT Goal Formulation: With patient Time For Goal Achievement: 10/22/17 Potential to Achieve Goals: Good    Frequency Min 2X/week   Barriers to discharge        Co-evaluation                AM-PAC PT "6 Clicks" Daily Activity  Outcome Measure Difficulty turning over in bed (including adjusting bedclothes, sheets and blankets)?: Unable Difficulty moving from lying on back to sitting on the side of the bed? : Unable Difficulty sitting down on and standing up from a chair with arms (e.g., wheelchair, bedside commode, etc,.)?: Unable Help needed moving to and from a bed to chair (including a wheelchair)?: A Little Help needed walking in hospital room?: Total Help needed climbing 3-5 steps with a railing? : Total 6 Click Score: 8    End of Session Equipment Utilized During Treatment: Gait belt Activity Tolerance: Patient tolerated treatment well Patient left: in chair;with call bell/phone within reach;with chair alarm set;with nursing/sitter in room Nurse Communication: Mobility status;Precautions(Nursing present during session and observed pt's functional mobility) PT Visit Diagnosis: Other abnormalities of gait and mobility (R26.89);Muscle weakness (generalized) (M62.81)    Time: 4132-4401 PT Time Calculation (min) (ACUTE ONLY): 22 min   Charges:   PT Evaluation $PT Eval Low Complexity: 1 Low PT Treatments $Therapeutic Activity: 8-22 mins   PT G Codes:   PT G-Codes **NOT FOR INPATIENT CLASS** Functional Assessment Tool Used: AM-PAC 6 Clicks  Basic Mobility Functional Limitation: Mobility: Walking and moving around Mobility: Walking and Moving Around Current Status [phone]): At least 80 percent but less than 100 percent impaired, limited or restricted Mobility: Walking and Moving Around Goal Status [phone]): At least 40 percent but less than 60 percent impaired, limited or restricted    Leitha Bleak, PT 10/08/17, 12:00 PM [phone]

## 2017-10-08 NOTE — Progress Notes (Signed)
Nutrition Education Note  RD consulted for nutrition education regarding new onset CHF.  RD provided "Low Sodium Nutrition Therapy" handout from the Academy of Nutrition and Dietetics. Reviewed patient's dietary recall. Provided examples on ways to decrease sodium intake in diet. Discouraged intake of processed foods and use of salt shaker. Encouraged fresh fruits and vegetables as well as whole grain sources of carbohydrates to maximize fiber intake.   RD discussed why it is important for patient to adhere to diet recommendations, and emphasized the role of fluids, foods to avoid, and importance of weighing self daily. Teach back method used.  Expect poor compliance.  Body mass index is 32.45 kg/m. Pt meets criteria for obese based on current BMI. Per chart, pt with insignificant wt loss.   Current diet order is HH, patient is consuming approximately 100% of meals at this time. Labs and medications reviewed.   RD will add Premier Protein and liberalize diet to help pt meet her estimated protein needs.   No further nutrition interventions warranted at this time. RD contact information provided. If additional nutrition issues arise, please re-consult RD.   Koleen Distance MS, RD, LDN Pager #- [phone] After Hours Pager: [phone]

## 2017-10-08 NOTE — Progress Notes (Addendum)
[place] [facility] [place] NAME: Aryiana Klinkner    MR#:  [id number]  DATE OF BIRTH:  [birth year]  SUBJECTIVE:  CHIEF COMPLAINT:   Chief Complaint  Patient presents with  . Shortness of Breath   better SOB, minor nose bleeding this morning.  REVIEW OF SYSTEMS:  CONSTITUTIONAL: No fever, but has generalized weakness.  EYES: No blurred or double vision.  EARS, NOSE, AND THROAT: No tinnitus or ear pain.  Epistaxis. RESPIRATORY: No cough, shortness of breath, wheezing or hemoptysis.  CARDIOVASCULAR: No chest pain, orthopnea, edema.  GASTROINTESTINAL: No nausea, vomiting, diarrhea or abdominal pain.  No melena no bloody stool. GENITOURINARY: No dysuria, hematuria.  ENDOCRINE: No polyuria, nocturia,  HEMATOLOGY: No anemia, easy bruising or bleeding SKIN: No rash or lesion. MUSCULOSKELETAL: No joint pain or arthritis.   NEUROLOGIC: No tingling, numbness, weakness.  PSYCHIATRY: No anxiety or depression.   ROS  DRUG ALLERGIES:   Allergies  Allergen Reactions  . Sulfamethoxazole-Trimethoprim Nausea And Vomiting    VITALS:  Blood pressure 127/79, pulse 87, temperature 97.9 F (36.6 C), temperature source Oral, resp. rate 18, height 5\' 7"  (1.702 m), weight 207 lb 3.2 oz (94 kg), SpO2 93 %.  PHYSICAL EXAMINATION:  GENERAL:  83 y.o.-year-old patient lying in the bed with no acute distress.  EYES: Pupils equal, round, reactive to light and accommodation. No scleral icterus. Extraocular muscles intact.  HEENT: Head atraumatic, normocephalic. Oropharynx and nasopharynx clear.  NECK:  Supple, no jugular venous distention. No thyroid enlargement, no tenderness.  LUNGS: Normal breath sounds bilaterally, no wheezing, bibasilar mild rales, no rhonchi or crepitation. No use of accessory muscles of respiration.  CARDIOVASCULAR: S1, S2 normal. No murmurs, rubs, or gallops.  ABDOMEN: Soft, nontender, nondistended. Bowel sounds present. No organomegaly or mass.   EXTREMITIES: No pedal edema, cyanosis, or clubbing.  NEUROLOGIC: Cranial nerves II through XII are intact. Muscle strength 3-4/5 in all extremities. Sensation intact. Gait not checked.  PSYCHIATRIC: The patient is alert and oriented x 3.  SKIN: No obvious rash, lesion, or ulcer.   Physical Exam LABORATORY PANEL:   CBC Recent Labs  Lab 10/08/17 0558  WBC 1.9*  HGB 12.3  HCT 38.3  PLT 536*   ------------------------------------------------------------------------------------------------------------------  Chemistries  Recent Labs  Lab 10/08/17 0558  NA 138  K 3.6  CL 103  CO2 29  GLUCOSE 101*  BUN 10  CREATININE 0.55  CALCIUM 8.6*   ------------------------------------------------------------------------------------------------------------------  Cardiac Enzymes Recent Labs  Lab 10/06/17 0636  TROPONINI <0.03   ------------------------------------------------------------------------------------------------------------------  RADIOLOGY:  No results found.  ASSESSMENT AND PLAN:   Active Problems:   Acute CHF (congestive heart failure) (HCC)  #Acute respiratory failure possibly from new onset CHF / Pulmonary hypertension  IV Lasix Normal LVF   Normal Wall Motion   EF=60% per echocardiogram, continue IV lasix. Monitor daily weights, intake and output Chest x-ray with pulmonary hypertension but can not rule in pulmonary hypertension at this time per Dr. Raul Del, consult pulmonology.  #Acute cystitis with abnormal urinalysis Urine culture: E. coli, discontinue IV Rocephin and start Keflex.   #History of DVT/pulmonary embolism On Coumadin INR is subtherapeutic, on therapeutic dose of Lovenox until INR is therapeutic  #Chronic history of polycythemia vera Outpatient follow-up with Dr. Mike Gip on home medication hydroxyurea  Leukopenia.  Hold hydroxyurea if oncologist agrees and follow-up oncology.  Obesity. Generalized weakness.  PT evaluation:  home health and PT. All the records are reviewed and case discussed with Care Management/Social  Workerr. Management plans discussed with the patient, family and they are in agreement.  CODE STATUS: DNR  TOTAL TIME TAKING CARE OF THIS PATIENT: 35 minutes.    POSSIBLE D/C IN 1-2 DAYS, DEPENDING ON CLINICAL CONDITION.   Demetrios Loll M.D on 10/08/2017   Between 7am to 6pm - Pager - [phone]  After 6pm go to [email or web address] - password [email or web address] [facility]  Office  [phone]  CC: Primary care physician; [name], MD  Note: This dictation was prepared with Dragon dictation along with smaller phrase technology. Any transcriptional errors that result from this process are unintentional.

## 2017-10-08 NOTE — Care Management Note (Signed)
Case Management Note  Patient Details  Name: Yolanda Wells MRN: [id number] Date of Birth: 1932/09/06  Subjective/Objective:                 Patient would benefit from home health RN and physical therapy per physical therapy care team member  Action/Plan:  Left message for granddaughter [name] regarding home health  Expected Discharge Date:                  Expected Discharge Plan:     In-House Referral:     Discharge planning Services     Post Acute Care Choice:    Choice offered to:     DME Arranged:    DME Agency:     HH Arranged:    [place] Agency:     Status of Service:     If discussed at [employer], dates discussed:    Additional Comments:  Katrina Stack, RN 10/08/2017, 3:52 PM

## 2017-10-08 NOTE — Consult Note (Signed)
Malala Trenkamp is a 83 y.o. female  [id number]  Primary Cardiologist: Neoma Laming Reason for Consultation: CHF  HPI: 55 YOBF presented with SOB and swelling of legs with h/o Pulmonary HTN and PE.   Review of Systems: NO chest pain   Past Medical History:  Diagnosis Date  . Acute deep vein thrombosis (DVT) of distal vein of left lower extremity ([place]) 10/03/2015  . Acute pulmonary embolism ([place]) 10/03/2015  . Anticoagulant long-term use   . Arthritis   . Chronic anticoagulation 10/03/2015  . Collagen vascular disease ([place])   . Colostomy in place [facility]) 10/04/2015  . Deep venous thrombosis (HCC)    right lower extremity  . Dependent edema   . Diverticulosis of intestine with bleeding 04/02/2015  . Glenohumeral arthritis 06/28/2012  . Hammertoe 09/02/2012  . History of bilateral hip replacements 09/02/2012  . History of hysterectomy   . Hypertension   . Hypokalemia   . Inability to ambulate due to hip 10/12/2015  . Mandibular dysfunction   . Onychomycosis 09/02/2012  . Osteoarthritis   . Polycythemia vera ([place])   . Presence of IVC filter 05/25/2015  . Stroke ([place]) 03/26/2015   cerebellar  . Urinary incontinence in female 09/02/2016    Medications Prior to Admission  Medication Sig Dispense Refill  . aspirin EC 81 MG tablet Take 1 tablet (81 mg total) by mouth daily. 30 tablet 2  . ENSURE (ENSURE) Take 1 Can by mouth 3 (three) times daily with meals.    . folic acid (FOLVITE) 326 MCG tablet Take 400 mcg by mouth daily.    . hydroxyurea (HYDREA) 500 MG capsule TAKE FOUR CAPSULES BY MOUTH DAILY (Patient taking differently: Take 1,500 mg by mouth 4 (four) times a week. Tuesday,Thursday,Saturday,Sunday) 120 capsule 0  . potassium chloride (K-DUR,KLOR-CON) 10 MEQ tablet Take 10 mEq by mouth 2 (two) times a week. On Monday and Thursday  With lasix for potassium replacement    . warfarin (COUMADIN) 1 MG tablet Take 0.5 tablet with a 3.5 mg tablet 4 days a week for total dose  of 4 mg. Take 3.5 mg tablet 3 days a week until further notice 30 tablet 1  . warfarin (COUMADIN) 3 MG tablet Take 1 tablet along with a 1 mg tablet for a total dose of 4 mg 4 days a week, take 3.5 mg 3 days a week 30 tablet 0  . amLODipine (NORVASC) 5 MG tablet Take 1 tablet (5 mg total) by mouth daily. (Patient not taking: Reported on 10/06/2017) 30 tablet 11  . docusate sodium (COLACE) 100 MG capsule Take 100 mg by mouth 2 (two) times daily.    . polyethylene glycol (MIRALAX / GLYCOLAX) packet Take 17 g by mouth daily.    [name] triamcinolone cream (KENALOG) 0.1 % Apply 1 application topically 2 (two) times daily. To affected area       . aspirin EC  81 mg Oral Daily  . docusate sodium  100 mg Oral BID  . enoxaparin (LOVENOX) injection  1 mg/kg Subcutaneous Q12H  . feeding supplement (ENSURE ENLIVE)  237 mL Oral TID BM  . folic acid  0.5 mg Oral Daily  . furosemide  20 mg Intravenous BID  . hydroxyurea  1,500 mg Oral Once per day on Sun Tue Thu Sat  . polyethylene glycol  17 g Oral Daily  . sodium chloride flush  3 mL Intravenous Q12H  . triamcinolone cream  1 application Topical BID  . warfarin  4  mg Oral q1800  . Warfarin - Pharmacist Dosing Inpatient   Does not apply q1800    Infusions: . sodium chloride    . cefTRIAXone (ROCEPHIN)  IV Stopped (10/08/17 1216)    Allergies  Allergen Reactions  . Sulfamethoxazole-Trimethoprim Nausea And Vomiting    Social History   Socioeconomic History  . Marital status: Widowed    Spouse name: Not on file  . Number of children: 5  . Years of education: Not on file  . Highest education level: Not on file  Social Needs  . Financial resource strain: Not on file  . Food insecurity - worry: Not on file  . Food insecurity - inability: Not on file  . Transportation needs - medical: Not on file  . Transportation needs - non-medical: Not on file  Occupational History  . Occupation: retired  Tobacco Use  . Smoking status: Never Smoker  .  Smokeless tobacco: Never Used  Substance and Sexual Activity  . Alcohol use: No  . Drug use: No  . Sexual activity: No  Other Topics Concern  . Not on file  Social History Narrative   Lives at [facility]    Family History  Problem Relation Age of Onset  . Brain cancer Mother   . Heart attack Father   . COPD Father   . Coronary artery disease Father   . Hypertension Unknown   . Arthritis-Osteo Unknown   . Diabetes Brother   . Arthritis Brother   . Osteosarcoma Son   . Bone cancer Son   . Arthritis Sister   . Arthritis Brother   . Hypertension Brother   . Coronary artery disease Brother     PHYSICAL EXAM: Vitals:   10/08/17 0336 10/08/17 0744  BP: 131/71 127/79  Pulse: 87 87  Resp: 17 18  Temp: 97.9 F (36.6 C) 97.9 F (36.6 C)  SpO2: 99% 93%     Intake/Output Summary (Last 24 hours) at 10/08/2017 1329 Last data filed at 10/08/2017 1100 Gross per 24 hour  Intake 653 ml  Output 1900 ml  Net -1247 ml    General:  Well appearing. No respiratory difficulty HEENT: normal Neck: supple. no JVD. Carotids 2+ bilat; no bruits. No lymphadenopathy or thryomegaly appreciated. Cor: PMI nondisplaced. Regular rate & rhythm. No rubs, gallops or murmurs. Lungs: clear Abdomen: soft, nontender, nondistended. No hepatosplenomegaly. No bruits or masses. Good bowel sounds. Extremities: no cyanosis, clubbing, rash, edema Neuro: alert & oriented x 3, cranial nerves grossly intact. moves all 4 extremities w/o difficulty. Affect pleasant.  ECG:NSR with old IWMI and nonspecific st and t changes and RAD and RVH  Results for orders placed or performed during the hospital encounter of 10/06/17 (from the past 24 hour(s))  Basic metabolic panel     Status: Abnormal   Collection Time: 10/08/17  5:58 AM  Result Value Ref Range   Sodium 138 135 - 145 mmol/L   Potassium 3.6 3.5 - 5.1 mmol/L   Chloride 103 101 - 111 mmol/L   CO2 29 22 - 32 mmol/L   Glucose, Bld 101 (H) 65 - 99 mg/dL    BUN 10 6 - 20 mg/dL   Creatinine, Ser 0.55 0.44 - 1.00 mg/dL   Calcium 8.6 (L) 8.9 - 10.3 mg/dL   GFR calc non Af Amer >60 >60 mL/min   GFR calc Af Amer >60 >60 mL/min   Anion gap 6 5 - 15  Protime-INR     Status: Abnormal   Collection  Time: 10/08/17  5:58 AM  Result Value Ref Range   Prothrombin Time 19.5 (H) 11.4 - 15.2 seconds   INR 1.66   CBC     Status: Abnormal   Collection Time: 10/08/17  5:58 AM  Result Value Ref Range   WBC 1.9 (L) 3.6 - 11.0 K/uL   RBC 4.67 3.80 - 5.20 MIL/uL   Hemoglobin 12.3 12.0 - 16.0 g/dL   HCT 38.3 35.0 - 47.0 %   MCV 82.0 80.0 - 100.0 fL   MCH 26.3 26.0 - 34.0 pg   MCHC 32.1 32.0 - 36.0 g/dL   RDW 31.4 (H) 11.5 - 14.5 %   Platelets 536 (H) 150 - 440 K/uL   No results found.   ASSESSMENT AND PLAN: CHF due to diastolic dysfunction with old IWMI. Probably Has CAD. Will do CCTA in office and agree with IV lasix and get INR to be therapeutic. Janeann Paisley A

## 2017-10-08 NOTE — Care Management Note (Signed)
Case Management Note  Patient Details  Name: Yolanda Wells MRN: [id number] Date of Birth: 18-Aug-1932  Subjective/Objective:                 Spoke with [name]- granddaughter.  Patient is currently receiving home health services through [facility]   Action/Plan:  Resume home health at discharge  Expected Discharge Date:                  Expected Discharge Plan:     In-House Referral:     Discharge planning Services     Post Acute Care Choice:    Choice offered to:     DME Arranged:    DME Agency:     HH Arranged:    [place] Agency:     Status of Service:     If discussed at [employer], dates discussed:    Additional Comments:  Katrina Stack, RN 10/08/2017, 4:55 PM

## 2017-10-08 NOTE — Progress Notes (Signed)
Pt. Experienced minor nose bleed, applied moisturizer to nares, notified MD, orders placed.

## 2017-10-09 ENCOUNTER — Other Ambulatory Visit: Payer: Self-pay | Admitting: *Deleted

## 2017-10-09 DIAGNOSIS — Z87440 Personal history of urinary (tract) infections: Secondary | ICD-10-CM

## 2017-10-09 DIAGNOSIS — D45 Polycythemia vera: Secondary | ICD-10-CM

## 2017-10-09 DIAGNOSIS — Z86718 Personal history of other venous thrombosis and embolism: Secondary | ICD-10-CM

## 2017-10-09 DIAGNOSIS — K766 Portal hypertension: Secondary | ICD-10-CM

## 2017-10-09 DIAGNOSIS — Z7982 Long term (current) use of aspirin: Secondary | ICD-10-CM

## 2017-10-09 DIAGNOSIS — Z8673 Personal history of transient ischemic attack (TIA), and cerebral infarction without residual deficits: Secondary | ICD-10-CM

## 2017-10-09 DIAGNOSIS — G4709 Other insomnia: Secondary | ICD-10-CM

## 2017-10-09 DIAGNOSIS — Z86711 Personal history of pulmonary embolism: Secondary | ICD-10-CM

## 2017-10-09 DIAGNOSIS — Z79899 Other long term (current) drug therapy: Secondary | ICD-10-CM

## 2017-10-09 DIAGNOSIS — M1990 Unspecified osteoarthritis, unspecified site: Secondary | ICD-10-CM

## 2017-10-09 DIAGNOSIS — Z89431 Acquired absence of right foot: Secondary | ICD-10-CM

## 2017-10-09 DIAGNOSIS — Z7901 Long term (current) use of anticoagulants: Secondary | ICD-10-CM

## 2017-10-09 DIAGNOSIS — R0602 Shortness of breath: Secondary | ICD-10-CM

## 2017-10-09 DIAGNOSIS — D709 Neutropenia, unspecified: Secondary | ICD-10-CM

## 2017-10-09 DIAGNOSIS — R040 Epistaxis: Secondary | ICD-10-CM

## 2017-10-09 DIAGNOSIS — I2699 Other pulmonary embolism without acute cor pulmonale: Secondary | ICD-10-CM

## 2017-10-09 DIAGNOSIS — Z933 Colostomy status: Secondary | ICD-10-CM

## 2017-10-09 DIAGNOSIS — I82509 Chronic embolism and thrombosis of unspecified deep veins of unspecified lower extremity: Secondary | ICD-10-CM

## 2017-10-09 LAB — CBC WITH DIFFERENTIAL/PLATELET
BASOS ABS: 0 10*3/uL (ref 0–0.1)
Basophils Relative: 1 %
EOS PCT: 2 %
Eosinophils Absolute: 0 10*3/uL (ref 0–0.7)
HCT: 38.9 % (ref 35.0–47.0)
Hemoglobin: 12.3 g/dL (ref 12.0–16.0)
LYMPHS PCT: 54 %
Lymphs Abs: 1.1 10*3/uL (ref 1.0–3.6)
MCH: 26.1 pg (ref 26.0–34.0)
MCHC: 31.7 g/dL — AB (ref 32.0–36.0)
MCV: 82.1 fL (ref 80.0–100.0)
MONO ABS: 0.1 10*3/uL — AB (ref 0.2–0.9)
MONOS PCT: 5 %
Neutro Abs: 0.8 10*3/uL — ABNORMAL LOW (ref 1.4–6.5)
Neutrophils Relative %: 38 %
PLATELETS: 580 10*3/uL — AB (ref 150–440)
RBC: 4.73 MIL/uL (ref 3.80–5.20)
RDW: 30.7 % — AB (ref 11.5–14.5)
WBC: 2 10*3/uL — ABNORMAL LOW (ref 3.6–11.0)

## 2017-10-09 LAB — BASIC METABOLIC PANEL
Anion gap: 10 (ref 5–15)
BUN: 19 mg/dL (ref 6–20)
CALCIUM: 8.7 mg/dL — AB (ref 8.9–10.3)
CO2: 27 mmol/L (ref 22–32)
CREATININE: 0.57 mg/dL (ref 0.44–1.00)
Chloride: 99 mmol/L — ABNORMAL LOW (ref 101–111)
GFR calc non Af Amer: >60 mL/min (ref >60)
Glucose, Bld: 101 mg/dL — ABNORMAL HIGH (ref 65–99)
Potassium: 3.3 mmol/L — ABNORMAL LOW (ref 3.5–5.1)
SODIUM: 136 mmol/L (ref 135–145)

## 2017-10-09 LAB — PROTIME-INR
INR: 1.74
PROTHROMBIN TIME: 20.2 s — AB (ref 11.4–15.2)

## 2017-10-09 MED ORDER — POTASSIUM CHLORIDE CRYS ER 20 MEQ PO TBCR
40.0000 meq | EXTENDED_RELEASE_TABLET | Freq: Once | ORAL | Status: AC
  Administered 2017-10-09: 40 meq via ORAL
  Filled 2017-10-09: qty 2

## 2017-10-09 MED ORDER — FUROSEMIDE 20 MG PO TABS: 20.0000 mg | ORAL_TABLET | Freq: Two times a day (BID) | ORAL | 0 refills | Status: DC

## 2017-10-09 MED ORDER — WARFARIN SODIUM 4 MG PO TABS: 4.0000 mg | ORAL_TABLET | Freq: Every day | ORAL | 0 refills | Status: DC

## 2017-10-09 NOTE — Consult Note (Signed)
[facility]  Date of admission:  10/06/2017  Inpatient day:  10/09/2017  Consulting physician: Dr. Demetrios Loll   Reason for Consultation:  Leukopenia.  Chief Complaint: Yolanda Wells is a 83 y.o. female with polycythemia rubra vera (PV) who was admitted through the emergency room with shortness of breath.  HPI:  She presented with a 2 day history of shortness of breath.  Notes indicate that she gained 7 pounds of weight and was unable to sleep secondary to shortness of breath.She also had a recent E coli UTI which had not been treated in the outpatient department.  She was treated with Lasix.  Echo revealed an EF of 60%.  CXR suggested pulmonary artery hypertension.  CBC on admission included a hematocrit of 40.6, hemoglobin 12.9, MCV 82.4, platelets 610,000 and WBC 2100 with an [place] of 900.  She has continued her hydroxyurea.  PT/INR was 16.9/1.39 on admission.  She was placed on Lovenox and continued Coumadin.  CBC today reveals a hematocrit of 38.9, hemoglobin 12.3, MCV 82.1, platelets 580,000, white count 2000 with an ANC of 800. PT/INR is 20.2/1.74.  Symptomatically, she is feeling better. She still has some shortness of breath. She describes a transient right-sided nosebleed during this admission. She is concerned about her heart and lungs. UTI symptoms have resolved. She denies any bruising or bleeding   Past Medical History:  Diagnosis Date  . Acute deep vein thrombosis (DVT) of distal vein of left lower extremity ([place]) 10/03/2015  . Acute pulmonary embolism ([place]) 10/03/2015  . Anticoagulant long-term use   . Arthritis   . Chronic anticoagulation 10/03/2015  . Collagen vascular disease ([place])   . Colostomy in place [facility]) 10/04/2015  . Deep venous thrombosis (H[facility])    right lower extremity  . Dependent edema   . Diverticulosis of intestine with bleeding 04/02/2015  . Glenohumeral arthritis 06/28/2012  . Hammertoe 09/02/2012  . History of bilateral hip  replacements 09/02/2012  . History of hysterectomy   . Hypertension   . Hypokalemia   . Inability to ambulate due to hip 10/12/2015  . Mandibular dysfunction   . Onychomycosis 09/02/2012  . Osteoarthritis   . Polycythemia vera ([place])   . Presence of IVC filter 05/25/2015  . 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 Urinary incontinence in female 09/02/2016    Past Surgical History:  Procedure Laterality Date  . ABDOMINAL HYSTERECTOMY    . BLEPHAROPLASTY Right 03/2017   right upper eyelid  . CARDIAC CATHETERIZATION Right 04/03/2015   Procedure: CENTRAL LINE INSERTION;  Surgeon: Sherri Rad, MD;  Location: [facility] ORS;  Service: General;  Laterality: Right;  . COLECTOMY WITH COLOSTOMY CREATION/HARTMANN PROCEDURE N/A 04/03/2015   Procedure: COLECTOMY WITH COLOSTOMY CREATION/HARTMANN PROCEDURE;  Surgeon: Sherri Rad, MD;  Location: [facility] ORS;  Service: General;  Laterality: N/A;  . FOOT AMPUTATION     partial  . FOOT AMPUTATION Right   . JOINT REPLACEMENT     Left and Right Hip  . PERIPHERAL VASCULAR CATHETERIZATION N/A 04/02/2015   Procedure: Visceral Angiography;  Surgeon: Algernon Huxley, MD;  Location: [place] [facility] LAB;  Service: Cardiovascular;  Laterality: N/A;  . PERIPHERAL VASCULAR CATHETERIZATION N/A 04/02/2015   Procedure: Visceral Artery Intervention;  Surgeon: Algernon Huxley, MD;  Location: [place] [facility];  Service: Cardiovascular;  Laterality: N/A;  . PERIPHERAL VASCULAR CATHETERIZATION N/A 05/25/2015   Procedure: IVC Filter Insertion;  Surgeon: Katha Cabal, MD;  Location: [place] [facility];  Service: Cardiovascular;  Laterality: N/A;  . TOE AMPUTATION     right  . TOTAL HIP ARTHROPLASTY      Family History  Problem Relation Age of Onset  . Brain cancer Mother   . Heart attack Father   . COPD Father   . Coronary artery disease Father   . Hypertension Unknown   . Arthritis-Osteo Unknown   . Diabetes Brother   . Arthritis Brother   . Osteosarcoma Son   . Bone  cancer Son   . Arthritis Sister   . Arthritis Brother   . Hypertension Brother   . Coronary artery disease Brother     Social History:  reports that  has never smoked. she has never used smokeless tobacco. She reports that she does not drink alcohol or use drugs.  She is alone today.  Allergies:  Allergies  Allergen Reactions  . Sulfamethoxazole-Trimethoprim Nausea And Vomiting    Medications Prior to Admission  Medication Sig Dispense Refill  . aspirin EC 81 MG tablet Take 1 tablet (81 mg total) by mouth daily. 30 tablet 2  . ENSURE (ENSURE) Take 1 Can by mouth 3 (three) times daily with meals.    . folic acid (FOLVITE) 604 MCG tablet Take 400 mcg by mouth daily.    . hydroxyurea (HYDREA) 500 MG capsule TAKE FOUR CAPSULES BY MOUTH DAILY (Patient taking differently: Take 1,500 mg by mouth 4 (four) times a week. Tuesday,Thursday,Saturday,Sunday) 120 capsule 0  . potassium chloride (K-DUR,KLOR-CON) 10 MEQ tablet Take 10 mEq by mouth 2 (two) times a week. On Monday and Thursday  With lasix for potassium replacement    . warfarin (COUMADIN) 1 MG tablet Take 0.5 tablet with a 3.5 mg tablet 4 days a week for total dose of 4 mg. Take 3.5 mg tablet 3 days a week until further notice 30 tablet 1  . warfarin (COUMADIN) 3 MG tablet Take 1 tablet along with a 1 mg tablet for a total dose of 4 mg 4 days a week, take 3.5 mg 3 days a week 30 tablet 0  . amLODipine (NORVASC) 5 MG tablet Take 1 tablet (5 mg total) by mouth daily. (Patient not taking: Reported on 10/06/2017) 30 tablet 11  . docusate sodium (COLACE) 100 MG capsule Take 100 mg by mouth 2 (two) times daily.    . polyethylene glycol (MIRALAX / GLYCOLAX) packet Take 17 g by mouth daily.    [name] triamcinolone cream (KENALOG) 0.1 % Apply 1 application topically 2 (two) times daily. To affected area      Review of Systems: GENERAL:  Feels "ok".  No fevers or sweats.  Weight gain on admission (fluid). PERFORMANCE STATUS (ECOG):  3 HEENT:   Decreased vision in right eye s/p embolic event.  Right sided epistaxis.  No runny nose, sore throat, mouth sores or tenderness. Lungs:  Shortness of breath, improved.  No cough.  No hemoptysis. Cardiac:  No chest pain, palpitations, or PND.  Orthopnea.  GI:  No nausea, vomiting, diarrhea, constipation, melena or hematochezia. GU:  No urgency, frequency, dysuria, or hematuria.  Incontinent.  Wears adult diapers. Musculoskeletal:  Chronic knee problems.  No muscle tenderness. Extremities:  Chronic swelling in legs, improved. Skin:  Dry skin.  No rashes. Neuro:  No headache, numbness or weakness, balance or coordination issues. Endocrine:  No diabetes, thyroid issues, hot flashes or night sweats. Psych:  Worried about "heartand lungs".  Pain:  No focal pain. Review of systems:  All other systems reviewed and found to  be negative.  Physical Exam:  Blood pressure 136/73, pulse 96, temperature 98.4 F (36.9 C), temperature source Oral, resp. rate 19, height _0  (1.702 m), weight 208 lb 1.6 oz (94.4 kg), SpO2 96 %.  GENERAL:  Elderly woman sitting comfortably in a lounge chair on the medical unit in no acute distress. MENTAL STATUS:  Alert and oriented to person, place and time. HEAD: Pearline Cables hair.  Normocephalic, atraumatic, face symmetric, no Cushingoid features. EYES:  Brown eyes.  Right sided entropion.  Pupils equal round and reactive to light and accomodation.  No conjunctivitis or scleral icterus. ENT:  Oropharynx clear without lesion.  Upper dentures.  Tongue normal. Mucous membranes moist.  RESPIRATORY:  Clear to auscultation without rales, wheezes or rhonchi. CARDIOVASCULAR:  Regular rate and rhythm without murmur, rub or gallop. ABDOMEN:  Left sided colostomy.  Soft, non-tender, with active bowel sounds, and no appreciable hepatosplenomegaly.  No masses. SKIN:  Right side of face thickened and dark.  No rashes or ulcers. EXTREMITIES:  Chronic lower extremity changes.  No skin  discoloration or tenderness.  No palpable cords. LYMPH NODES: No palpable cervical, supraclavicular, axillary or inguinal adenopathy  NEUROLOGICAL: Unremarkable. PSYCH:  Appropriate.   Results for orders placed or performed during the hospital encounter of 10/06/17 (from the past 48 hour(s))  Basic metabolic panel     Status: Abnormal   Collection Time: 10/08/17  5:58 AM  Result Value Ref Range   Sodium 138 135 - 145 mmol/L   Potassium 3.6 3.5 - 5.1 mmol/L   Chloride 103 101 - 111 mmol/L   CO2 29 22 - 32 mmol/L   Glucose, Bld 101 (H) 65 - 99 mg/dL   BUN 10 6 - 20 mg/dL   Creatinine, Ser 0.55 0.44 - 1.00 mg/dL   Calcium 8.6 (L) 8.9 - 10.3 mg/dL   GFR calc non Af Amer >60 >60 mL/min   GFR calc Af Amer >60 >60 mL/min    Comment: (NOTE) The eGFR has been calculated using the CKD EPI equation. This calculation has not been validated in all clinical situations. eGFR's persistently <60 mL/min signify possible Chronic Kidney Disease.    Anion gap 6 5 - 15  Protime-INR     Status: Abnormal   Collection Time: 10/08/17  5:58 AM  Result Value Ref Range   Prothrombin Time 19.5 (H) 11.4 - 15.2 seconds   INR 1.66   CBC     Status: Abnormal   Collection Time: 10/08/17  5:58 AM  Result Value Ref Range   WBC 1.9 (L) 3.6 - 11.0 K/uL   RBC 4.67 3.80 - 5.20 MIL/uL   Hemoglobin 12.3 12.0 - 16.0 g/dL   HCT 38.3 35.0 - 47.0 %   MCV 82.0 80.0 - 100.0 fL   MCH 26.3 26.0 - 34.0 pg   MCHC 32.1 32.0 - 36.0 g/dL   RDW 31.4 (H) 11.5 - 14.5 %   Platelets 536 (H) 150 - 440 K/uL  Basic metabolic panel     Status: Abnormal   Collection Time: 10/09/17  6:13 AM  Result Value Ref Range   Sodium 136 135 - 145 mmol/L   Potassium 3.3 (L) 3.5 - 5.1 mmol/L   Chloride 99 (L) 101 - 111 mmol/L   CO2 27 22 - 32 mmol/L   Glucose, Bld 101 (H) 65 - 99 mg/dL   BUN 19 6 - 20 mg/dL   Creatinine, Ser 0.57 0.44 - 1.00 mg/dL   Calcium 8.7 (L) 8.9 -  10.3 mg/dL   GFR calc non Af Amer >60 >60 mL/min   GFR calc Af Amer  >60 >60 mL/min    Comment: (NOTE) The eGFR has been calculated using the CKD EPI equation. This calculation has not been validated in all clinical situations. eGFR's persistently <60 mL/min signify possible Chronic Kidney Disease.    Anion gap 10 5 - 15  Protime-INR     Status: Abnormal   Collection Time: 10/09/17  6:13 AM  Result Value Ref Range   Prothrombin Time 20.2 (H) 11.4 - 15.2 seconds   INR 1.74   CBC with Differential/Platelet     Status: Abnormal   Collection Time: 10/09/17  6:13 AM  Result Value Ref Range   WBC 2.0 (L) 3.6 - 11.0 K/uL   RBC 4.73 3.80 - 5.20 MIL/uL   Hemoglobin 12.3 12.0 - 16.0 g/dL   HCT 38.9 35.0 - 47.0 %   MCV 82.1 80.0 - 100.0 fL   MCH 26.1 26.0 - 34.0 pg   MCHC 31.7 (L) 32.0 - 36.0 g/dL   RDW 30.7 (H) 11.5 - 14.5 %   Platelets 580 (H) 150 - 440 K/uL   Neutrophils Relative % 38 %   Neutro Abs 0.8 (L) 1.4 - 6.5 K/uL   Lymphocytes Relative 54 %   Lymphs Abs 1.1 1.0 - 3.6 K/uL   Monocytes Relative 5 %   Monocytes Absolute 0.1 (L) 0.2 - 0.9 K/uL   Eosinophils Relative 2 %   Eosinophils Absolute 0.0 0 - 0.7 K/uL   Basophils Relative 1 %   Basophils Absolute 0.0 0 - 0.1 K/uL   No results found.  Assessment:  The patient is a 83 y.o. woman with JAK2+ polycythemia rubra vera (PV) on a phlebotomy program and hydroxyurea. She received P32 in an attempt to manage her counts in 03/2015 and on 05/14/2017.  She has been on hydroxyurea (3 pills alternating with 4 pills).  She has had difficulty maintaining an adequate WBC and keeping her platelet count < 400,000.  Course has been complicated by a cerebellar CVA, bilateral lower extremity DVTs, and bilateral pulmonary emboli.  She has an IVC filter in place.  She is on Coumadin 4 mg a day.  INR has improved from 1.39 to 1.74.  Symptomatically, she feels less short of breath.  She denies any bruising or bleeding.   Plan:    1.  Oncology:  Patient neutropenic.  Agree with holding hydroxyurea over weekend  with reassessment of counts on 10/12/2017.  Continue current dose of Coumadin as INR improving.  INR goal 2.0 - 3.0.  2.  Disposition:  Patient to be discharged today.  Follow-up in the oncology clinic on 10/12/2017.  Patient's nurse and oncology scheduling aware.  Thank you for allowing me to participate in Yolanda Wells 's care.  I will follow her closely with you while hospitalized and after discharge in the outpatient department.   Lequita Asal, MD  10/09/2017, 5:32 PM

## 2017-10-09 NOTE — Discharge Summary (Signed)
[place] [facility] [place] NAME: Yolanda Wells    MR#:  [id number]  DATE OF BIRTH:  1932-07-13  DATE OF ADMISSION:  10/06/2017   ADMITTING PHYSICIAN: Nicholes Mango, MD  DATE OF DISCHARGE: 10/09/2017  PRIMARY CARE PHYSICIAN: [name], MD   ADMISSION DIAGNOSIS:  Shortness of breath [R06.02] Pulmonary hypertension ([place]) [I27.20] Acute cystitis without hematuria [N30.00] Acute on chronic congestive heart failure, unspecified heart failure type ([place]) [I50.9] DISCHARGE DIAGNOSIS:  Active Problems:   Acute CHF (congestive heart failure) ([place])  SECONDARY DIAGNOSIS:   Past Medical History:  Diagnosis Date  . Acute deep vein thrombosis (DVT) of distal vein of left lower extremity ([place]) 10/03/2015  . Acute pulmonary embolism ([place]) 10/03/2015  . Anticoagulant long-term use   . Arthritis   . Chronic anticoagulation 10/03/2015  . Collagen vascular disease ([place])   . Colostomy in place [facility]) 10/04/2015  . Deep venous thrombosis (HCC)    right lower extremity  . Dependent edema   . Diverticulosis of intestine with bleeding 04/02/2015  . Glenohumeral arthritis 06/28/2012  . Hammertoe 09/02/2012  . History of bilateral hip replacements 09/02/2012  . History of hysterectomy   . Hypertension   . Hypokalemia   . Inability to ambulate due to hip 10/12/2015  . Mandibular dysfunction   . Onychomycosis 09/02/2012  . Osteoarthritis   . Polycythemia vera ([place])   . Presence of IVC filter 05/25/2015  . Stroke ([place]) 03/26/2015   cerebellar  . Urinary incontinence in female 09/02/2016   HOSPITAL COURSE:   #Acute respiratory failure possibly from new onset CHF /Pulmonary hypertension  IV Lasix Normal LVF Normal [place] EF=60% per echocardiogram, continue IV lasix. Monitor daily weights, intake and output Chest x-ray with pulmonary hypertension but can not rule in pulmonary hypertension at this time per Dr. Raul Del, consult  pulmonology.  #Acute cystitis with abnormal urinalysis Urine culture: E. coli, discontinued IV Rocephin and started Keflex. She has no symtptoms, discontinue keflex.  #History of DVT/pulmonary embolism On Coumadin and Lovenox,  INR is more therapeutic, continue coumadin 4 mg pm.  #Chronic history of polycythemia vera Outpatient follow-up with Dr. Mike Gip on home medication hydroxyurea  Leukopenia.  Hold hydroxyurea if oncologist agrees and follow-up oncology.  Obesity. Generalized weakness.  PT evaluation: home health and PT. DISCHARGE CONDITIONS:  Stable, discharge to home with home health today. CONSULTS OBTAINED:  Treatment Team:  Dionisio David, MD Lequita Asal, MD DRUG ALLERGIES:   Allergies  Allergen Reactions  . Sulfamethoxazole-Trimethoprim Nausea And Vomiting   DISCHARGE MEDICATIONS:   Allergies as of 10/09/2017      Reactions   Sulfamethoxazole-trimethoprim Nausea And Vomiting      Medication List    TAKE these medications   amLODipine 5 MG tablet Commonly known as:  NORVASC Take 1 tablet (5 mg total) by mouth daily.   aspirin EC 81 MG tablet Take 1 tablet (81 mg total) by mouth daily.   docusate sodium 100 MG capsule Commonly known as:  COLACE Take 100 mg by mouth 2 (two) times daily.   ENSURE Take 1 Can by mouth 3 (three) times daily with meals.   folic acid 191 MCG tablet Commonly known as:  FOLVITE Take 400 mcg by mouth daily.   furosemide 20 MG tablet Commonly known as:  LASIX Take 1 tablet (20 mg total) by mouth 2 (two) times daily.   hydroxyurea 500 MG capsule Commonly known as:  HYDREA TAKE FOUR CAPSULES BY  MOUTH DAILY What changed:    how much to take  how to take this  when to take this  additional instructions   polyethylene glycol packet Commonly known as:  MIRALAX / GLYCOLAX Take 17 g by mouth daily.   potassium chloride 10 MEQ tablet Commonly known as:  K-DUR,KLOR-CON Take 10 mEq by mouth 2 (two)  times a week. On Monday and Thursday  With lasix for potassium replacement   triamcinolone cream 0.1 % Commonly known as:  KENALOG Apply 1 application topically 2 (two) times daily. To affected area   warfarin 4 MG tablet Commonly known as:  COUMADIN Take 1 tablet (4 mg total) by mouth daily at 6 PM. What changed:    medication strength  how much to take  how to take this  when to take this  additional instructions  Another medication with the same name was removed. Continue taking this medication, and follow the directions you see here.        DISCHARGE INSTRUCTIONS:  See AVS.  If you experience worsening of your admission symptoms, develop shortness of breath, life threatening emergency, suicidal or homicidal thoughts you must seek medical attention immediately by calling 911 or calling your MD immediately  if symptoms less severe.  You Must read complete instructions/literature along with all the possible adverse reactions/side effects for all the Medicines you take and that have been prescribed to you. Take any new Medicines after you have completely understood and accpet all the possible adverse reactions/side effects.   Please note  You were cared for by a hospitalist during your hospital stay. If you have any questions about your discharge medications or the care you received while you were in the hospital after you are discharged, you can call the unit and asked to speak with the hospitalist on call if the hospitalist that took care of you is not available. Once you are discharged, your primary care physician will handle any further medical issues. Please note that NO REFILLS for any discharge medications will be authorized once you are discharged, as it is imperative that you return to your primary care physician (or establish a relationship with a primary care physician if you do not have one) for your aftercare needs so that they can reassess your need for medications  and monitor your lab values.    On the day of Discharge:  VITAL SIGNS:  Blood pressure 126/79, pulse 98, temperature 98.4 F (36.9 C), temperature source Oral, resp. rate 19, height 5\' 7"  (1.702 m), weight 208 lb 1.6 oz (94.4 kg), SpO2 98 %. PHYSICAL EXAMINATION:  GENERAL:  83 y.o.-year-old patient lying in the bed with no acute distress.  EYES: Pupils equal, round, reactive to light and accommodation. No scleral icterus. Extraocular muscles intact.  HEENT: Head atraumatic, normocephalic. Oropharynx and nasopharynx clear.  NECK:  Supple, no jugular venous distention. No thyroid enlargement, no tenderness.  LUNGS: Normal breath sounds bilaterally, no wheezing, rales,rhonchi or crepitation. No use of accessory muscles of respiration.  CARDIOVASCULAR: S1, S2 normal. No murmurs, rubs, or gallops.  ABDOMEN: Soft, non-tender, non-distended. Bowel sounds present. No organomegaly or mass.  EXTREMITIES: No pedal edema, cyanosis, or clubbing.  NEUROLOGIC: Cranial nerves II through XII are intact. Muscle strength 4/5 in all extremities. Sensation intact. Gait not checked.  PSYCHIATRIC: The patient is alert and oriented x 3.  SKIN: No obvious rash, lesion, or ulcer.  DATA REVIEW:   CBC Recent Labs  Lab 10/09/17 0613  WBC 2.0*  HGB 12.3  HCT 38.9  PLT 580*    Chemistries  Recent Labs  Lab 10/09/17 0613  NA 136  K 3.3*  CL 99*  CO2 27  GLUCOSE 101*  BUN 19  CREATININE 0.57  CALCIUM 8.7*     Microbiology Results  Results for orders placed or performed during the hospital encounter of 10/06/17  Urine culture     Status: Abnormal   Collection Time: 10/06/17  7:54 AM  Result Value Ref Range Status   Specimen Description URINE, CATHETERIZED  Final   Special Requests NONE  Final   Culture 50,000 COLONIES/mL ESCHERICHIA COLI (A)  Final   Report Status 10/08/2017 FINAL  Final   Organism ID, Bacteria ESCHERICHIA COLI (A)  Final      Susceptibility   Escherichia coli - MIC*     AMPICILLIN 16 INTERMEDIATE Intermediate     CEFAZOLIN <=4 SENSITIVE Sensitive     CEFTRIAXONE <=1 SENSITIVE Sensitive     CIPROFLOXACIN >=4 RESISTANT Resistant     GENTAMICIN <=1 SENSITIVE Sensitive     IMIPENEM <=0.25 SENSITIVE Sensitive     NITROFURANTOIN <=16 SENSITIVE Sensitive     TRIMETH/SULFA <=20 SENSITIVE Sensitive     AMPICILLIN/SULBACTAM 4 SENSITIVE Sensitive     PIP/TAZO <=4 SENSITIVE Sensitive     Extended ESBL NEGATIVE Sensitive     * 50,000 COLONIES/mL ESCHERICHIA COLI    RADIOLOGY:  No results found.   Management plans discussed with the patient, family and they are in agreement.  CODE STATUS: DNR   TOTAL TIME TAKING CARE OF THIS PATIENT: 33 minutes.    Demetrios Loll M.D on 10/09/2017 at 2:17 PM  Between 7am to 6pm - Pager - [phone]  After 6pm go to [email or web address] - Proofreader  [facility]  Office  [phone]  CC: Primary care physician; [name], MD   Note: This dictation was prepared with Dragon dictation along with smaller phrase technology. Any transcriptional errors that result from this process are unintentional.

## 2017-10-09 NOTE — Discharge Instructions (Signed)
Heart Failure Heart failure is a condition in which the heart has trouble pumping blood because it has become weak or stiff. This means that the heart does not pump blood efficiently for the body to work well. For some people with heart failure, fluid may back up into the lungs and there may be swelling (edema) in the lower legs. Heart failure is usually a long-term (chronic) condition. It is important for you to take good care of yourself and follow the treatment plan from your health care provider. What are the causes? This condition is caused by some health problems, including:  High blood pressure (hypertension). Hypertension causes the heart muscle to work harder than normal. High blood pressure eventually causes the heart to become stiff and weak.  Coronary artery disease (CAD). CAD is the buildup of cholesterol and fat (plaques) in the arteries of the heart.  Heart attack (myocardial infarction). Injured tissue, which is caused by the heart attack, does not contract as well and the heart's ability to pump blood is weakened.  Abnormal heart valves. When the heart valves do not open and close properly, the heart muscle must pump harder to keep the blood flowing.  Heart muscle disease (cardiomyopathy or myocarditis). Heart muscle disease is damage to the heart muscle from a variety of causes, such as drug or alcohol abuse, infections, or unknown causes. These can increase the risk of heart failure.  Lung disease. When the lungs do not work properly, the heart must work harder.  What increases the risk? Risk of heart failure increases as a person ages. This condition is also more likely to develop in people who:  Are overweight.  Are female.  Smoke or chew tobacco.  Abuse alcohol or illegal drugs.  Have taken medicines that can damage the heart, such as chemotherapy drugs.  Have diabetes. ? High blood sugar (glucose) is associated with high fat (lipid) levels in the  blood. ? Diabetes can also damage tiny blood vessels that carry nutrients to the heart muscle.  Have abnormal heart rhythms.  Have thyroid problems.  Have low blood counts (anemia).  What are the signs or symptoms? Symptoms of this condition include:  Shortness of breath with activity, such as when climbing stairs.  Persistent cough.  Swelling of the feet, ankles, legs, or abdomen.  Unexplained weight gain.  Difficulty breathing when lying flat (orthopnea).  Waking from sleep because of the need to sit up and get more air.  Rapid heartbeat.  Fatigue and loss of energy.  Feeling light-headed, dizzy, or close to fainting.  Loss of appetite.  Nausea.  Increased urination during the night (nocturia).  Confusion.  How is this diagnosed? This condition is diagnosed based on:  Medical history, symptoms, and a physical exam.  Diagnostic tests, which may include: ? Echocardiogram. ? Electrocardiogram (ECG). ? Chest X-ray. ? Blood tests. ? Exercise stress test. ? Radionuclide scans. ? Cardiac catheterization and angiogram.  How is this treated? Treatment for this condition is aimed at managing the symptoms of heart failure. Medicines, behavioral changes, or other treatments may be necessary to treat heart failure. Medicines These may include:  Angiotensin-converting enzyme (ACE) inhibitors. This type of medicine blocks the effects of a blood protein called angiotensin-converting enzyme. ACE inhibitors relax (dilate) the blood vessels and help to lower blood pressure.  Angiotensin receptor blockers (ARBs). This type of medicine blocks the actions of a blood protein called angiotensin. ARBs dilate the blood vessels and help to lower blood pressure.  Water pills (diuretics). Diuretics cause the kidneys to remove salt and water from the blood. The extra fluid is removed through urination, leaving a lower volume of blood that the heart has to pump.  Beta blockers.  These improve heart muscle strength and they prevent the heart from beating too quickly.  Digoxin. This increases the force of the heartbeat.  Healthy behavior changes These may include:  Reaching and maintaining a healthy weight.  Stopping smoking or chewing tobacco.  Eating heart-healthy foods.  Limiting or avoiding alcohol.  Stopping use of street drugs (illegal drugs).  Physical activity.  Other treatments These may include:  Surgery to open blocked coronary arteries or repair damaged heart valves.  Placement of a biventricular pacemaker to improve heart muscle function (cardiac resynchronization therapy). This device paces both the right ventricle and left ventricle.  Placement of a device to treat serious abnormal heart rhythms (implantable cardioverter defibrillator, or ICD).  Placement of a device to improve the pumping ability of the heart (left ventricular assist device, or LVAD).  Heart transplant. This can cure heart failure, and it is considered for certain patients who do not improve with other therapies.  Follow these instructions at home: Medicines  Take over-the-counter and prescription medicines only as told by your health care provider. Medicines are important in reducing the workload of your heart, slowing the progression of heart failure, and improving your symptoms. ? Do not stop taking your medicine unless your health care provider told you to do that. ? Do not skip any dose of medicine. ? Refill your prescriptions before you run out of medicine. You need your medicines every day. Eating and drinking   Eat heart-healthy foods. Talk with a dietitian to make an eating plan that is right for you. ? Choose foods that contain no trans fat and are low in saturated fat and cholesterol. Healthy choices include fresh or frozen fruits and vegetables, fish, lean meats, legumes, fat-free or low-fat dairy products, and whole-grain or high-fiber foods. ? Limit  salt (sodium) if directed by your health care provider. Sodium restriction may reduce symptoms of heart failure. Ask a dietitian to recommend heart-healthy seasonings. ? Use healthy cooking methods instead of frying. Healthy methods include roasting, grilling, broiling, baking, poaching, steaming, and stir-frying.  Limit your fluid intake if directed by your health care provider. Fluid restriction may reduce symptoms of heart failure. Lifestyle  Stop smoking or using chewing tobacco. Nicotine and tobacco can damage your heart and your blood vessels. Do not use nicotine gum or patches before talking to your health care provider.  Limit alcohol intake to no more than 1 drink per day for non-pregnant women and 2 drinks per day for men. One drink equals 12 oz of beer, 5 oz of wine, or 1 oz of hard liquor. ? Drinking more than that is harmful to your heart. Tell your health care provider if you drink alcohol several times a week. ? Talk with your health care provider about whether any level of alcohol use is safe for you. ? If your heart has already been damaged by alcohol or you have severe heart failure, drinking alcohol should be stopped completely.  Stop use of illegal drugs.  Lose weight if directed by your health care provider. Weight loss may reduce symptoms of heart failure.  Do moderate physical activity if directed by your health care provider. People who are elderly and people with severe heart failure should consult with a health care provider for physical activity  recommendations. Monitor important information  Weigh yourself every day. Keeping track of your weight daily helps you to notice excess fluid sooner. ? Weigh yourself every morning after you urinate and before you eat breakfast. ? Wear the same amount of clothing each time you weigh yourself. ? Record your daily weight. Provide your health care provider with your weight record.  Monitor and record your blood pressure as  told by your health care provider.  Check your pulse as told by your health care provider. Dealing with extreme temperatures  If the weather is extremely hot: ? Avoid vigorous physical activity. ? Use air conditioning or fans or seek a cooler location. ? Avoid caffeine and alcohol. ? Wear loose-fitting, lightweight, and light-colored clothing.  If the weather is extremely cold: ? Avoid vigorous physical activity. ? Layer your clothes. ? Wear mittens or gloves, a hat, and a scarf when you go outside. ? Avoid alcohol. General instructions  Manage other health conditions such as hypertension, diabetes, thyroid disease, or abnormal heart rhythms as told by your health care provider.  Learn to manage stress. If you need help to do this, ask your health care provider.  Plan rest periods when fatigued.  Get ongoing education and support as needed.  Participate in or seek rehabilitation as needed to maintain or improve independence and quality of life.  Stay up to date with immunizations. Keeping current on pneumococcal and influenza immunizations is especially important to prevent respiratory infections.  Keep all follow-up visits as told by your health care provider. This is important. Contact a health care provider if:  You have a rapid weight gain.  You have increasing shortness of breath that is unusual for you.  You are unable to participate in your usual physical activities.  You tire easily.  You cough more than normal, especially with physical activity.  You have any swelling or more swelling in areas such as your hands, feet, ankles, or abdomen.  You are unable to sleep because it is hard to breathe.  You feel like your heart is beating quickly (palpitations).  You become dizzy or light-headed when you stand up. Get help right away if:  You have difficulty breathing.  You notice or your family notices a change in your awareness, such as having trouble staying  awake or having difficulty with concentration.  You have pain or discomfort in your chest.  You have an episode of fainting (syncope). This information is not intended to replace advice given to you by your health care provider. Make sure you discuss any questions you have with your health care provider. Document Released: 10/27/2005 Document Revised: 07/01/2016 Document Reviewed: 05/21/2016 Elsevier Interactive Patient Education  2017 [place].  Bleeding Precautions When on Anticoagulant Therapy WHAT IS ANTICOAGULANT THERAPY? Anticoagulant therapy is taking medicine to prevent or reduce blood clots. It is also called blood thinner therapy. Blood clots that form in your blood vessels can be dangerous. They can break loose and travel to your heart, lungs, or brain. This increases your risk of a heart attack or stroke. Anticoagulant therapy causes blood to clot more slowly. You may need anticoagulant therapy if you have:  A medical condition that increases the likelihood that blood clots will form.  A heart defect or a problem with heart rhythm. It is also a common treatment after heart surgery, such as valve replacement. WHAT ARE COMMON TYPES OF ANTICOAGULANT THERAPY? Anticoagulant medicine can be injected or taken by mouth.If you need anticoagulant therapy quickly at  the hospital, the medicine may be injected under your skin or given through an IV tube. Heparin is a common example of an anticoagulant that you may get at the hospital. Most anticoagulant therapy is in the form of pills that you take at home every day. These may include:  Aspirin. This common blood thinner works by preventing blood cells (platelets) from sticking together to form a clot. Aspirin is not as strong as anticoagulants that slow down the time that it takes for your body to form a clot.  Clopidogrel. This is a newer type of drug that affects platelets. It is stronger than aspirin.  Warfarin. This is the most  common anticoagulant. It changes the way your body uses vitamin K, a vitamin that helps your blood to clot. The risk of bleeding is higher with warfarin than with aspirin. You will need frequent blood tests to make sure you are taking the safest amount.  New anticoagulants. Several new drugs have been approved. They are all taken by mouth. Studies show that these drugs work as well as warfarin. They do not require blood testing. They may cause less bleeding risk than warfarin. WHAT DO I NEED TO REMEMBER WHEN TAKING ANTICOAGULANT THERAPY? Anticoagulant therapy decreases your risk of forming a blood clot, but it increases your risk of bleeding. Work closely with your health care provider to make sure you are taking your medicine safely. These tips can help:  Learn ways to reduce your risk of bleeding.  If you are taking warfarin: ? Have blood tests as ordered by your health care provider. ? Do not make any sudden changes to your diet. Vitamin K in your diet can make warfarin less effective. ? Do not get pregnant. This medicine may cause birth defects.  Take your medicine at the same time every day. If you forget to take your medicine, take it as soon as you remember. If you miss a whole day, do not double your dose of medicine. Take your normal dose and call your health care provider to check in.  Do not stop taking your medicine on your own.  Tell your health care provider before you start taking any new medicine, vitamin, or herbal product. Some of these could interfere with your therapy.  Tell all of your health care providers that you are on anticoagulant therapy.  Do not have surgery, medical procedures, or dental work until you tell your health care provider that you are on anticoagulant therapy. WHAT CAN AFFECT HOW ANTICOAGULANTS WORK? Certain foods, vitamins, medicines, supplements, and herbal medicines change the way that anticoagulant therapy works. They may increase or decrease the  effects of your anticoagulant therapy. Either result can be dangerous for you.  Many over-the-counter medicines for pain, colds, or stomach problems interfere with anticoagulant therapy. Take these only as told by your health care provider.  Do not drink alcohol. It can interfere with your medicine and increase your risk of an injury that causes bleeding.  If you are taking warfarin, do not begin eating more foods that contain vitamin K. These include leafy green vegetables. Ask your health care provider if you should avoid any foods. WHAT ARE SOME WAYS TO PREVENT BLEEDING? You can prevent bleeding by taking certain precautions:  Be extra careful when you use knives, scissors, or other sharp objects.  Use an electric razor instead of a blade.  Do not use toothpicks.  Use a soft toothbrush.  Wear shoes that have nonskid soles.  Use bath mats  and handrails in your bathroom.  Wear gloves while you do yard work.  Wear a helmet when you ride a bike.  Wear your seat belt.  Prevent falls by removing loose rugs and extension cords from areas where you walk.  Do not play contact sports or participate in other activities that have a high risk of injury. [place] PROVIDER? Call your health care provider if:  You miss a dose of medicine: ? And you are not sure what to do. ? For more than one day.  You have: ? Menstrual bleeding that is heavier than normal. ? Blood in your urine. ? A bloody nose or bleeding gums. ? Easy bruising. ? Blood in your stool (feces) or have black and tarry stool. ? Side effects from your medicine.  You feel weak or dizzy.  You become pregnant. Seek immediate medical care if:  You have bleeding that will not stop.  You have sudden and severe headache or belly pain.  You vomit or you cough up bright red blood.  You have a severe blow to your head. WHAT ARE SOME QUESTIONS TO ASK MY HEALTH CARE PROVIDER?  What is the  best anticoagulant therapy for my condition?  What side effects should I watch for?  When should I take my medicine? What should I do if I forget to take it?  Will I need to have regular blood tests?  Do I need to change my diet? Are there foods or drinks that I should avoid?  What activities are safe for me?  What should I do if I want to get pregnant? This information is not intended to replace advice given to you by your health care provider. Make sure you discuss any questions you have with your health care provider. Document Released: 10/08/2015 Document Reviewed: 10/08/2015 Elsevier Interactive Patient Education  2017 [place] and Warfarin Warfarin is a blood thinner (anticoagulant). Anticoagulant medicines help prevent the formation of blood clots. These medicines work by decreasing the activity of vitamin K, which promotes normal blood clotting. When you take warfarin, problems can occur from suddenly increasing or decreasing the amount of vitamin K that you eat from one day to the next. Problems may include:  Blood clots.  Bleeding.  What general guidelines do I need to follow? To avoid problems when taking warfarin:  Eat a balanced diet that includes: ? Fresh fruits and vegetables. ? Whole grains. ? Low-fat dairy products. ? Lean proteins, such as fish, eggs, and lean cuts of meat.  Keep your intake of vitamin K consistent from day to day. To do this: ? Avoid eating large amounts of vitamin K one day and low amounts of vitamin K the next day. ? If you take a multivitamin that contains vitamin K, be sure to take it every day. ? Know which foods contain vitamin K. Use the lists below to understand serving sizes and the amount of vitamin K in one serving.  Avoid major changes in your diet. If you are going to change your diet, talk with your health care provider before making changes.  Work with a Financial planner (dietitian) to develop a  meal plan that works best for you.  High vitamin K foods Foods that are high in vitamin K contain more than 100 mcg (micrograms) per serving. These include:  Broccoli (cooked) -  cup has 110 mcg.  Brussels sprouts (cooked) -  cup has 109 mcg.  Greens, beet (cooked) -  cup has 350 mcg.  Greens, collard (cooked) -  cup has 418 mcg.  Greens, turnip (cooked) -  cup has 265 mcg.  Green onions or scallions -  cup has 105 mcg.  Kale (fresh or frozen) -  cup has 531 mcg.  Parsley (raw) - 10 sprigs has 164 mcg.  Spinach (cooked) -  cup has 444 mcg.  Swiss chard (cooked) -  cup has 287 mcg.  Moderate vitamin K foods Foods that have a moderate amount of vitamin K contain 25-100 mcg per serving. These include:  Asparagus (cooked) - 5 spears have 38 mcg.  Black-eyed peas (dried) -  cup has 32 mcg.  Cabbage (cooked) -  cup has 37 mcg.  Kiwi fruit - 1 medium has 31 mcg.  Lettuce - 1 cup has 57-63 mcg.  Okra (frozen) -  cup has 44 mcg.  Prunes (dried) - 5 prunes have 25 mcg.  Watercress (raw) - 1 cup has 85 mcg.  Low vitamin K foods Foods low in vitamin K contain less than 25 mcg per serving. These include:  Artichoke - 1 medium has 18 mcg.  Avocado - 1 oz. has 6 mcg.  Blueberries -  cup has 14 mcg.  Cabbage (raw) -  cup has 21 mcg.  Carrots (cooked) -  cup has 11 mcg.  Cauliflower (raw) -  cup has 11 mcg.  Cucumber with peel (raw) -  cup has 9 mcg.  Grapes -  cup has 12 mcg.  Mango - 1 medium has 9 mcg.  Nuts - 1 oz. has 15 mcg.  Pear - 1 medium has 8 mcg.  Peas (cooked) -  cup has 19 mcg.  Pickles - 1 spear has 14 mcg.  Pumpkin seeds - 1 oz. has 13 mcg.  Sauerkraut (canned) -  cup has 16 mcg.  Soybeans (cooked) -  cup has 16 mcg.  Tomato (raw) - 1 medium has 10 mcg.  Tomato sauce -  cup has 17 mcg.  Vitamin K-free foods If a food contain less than 5 mcg per serving, it is considered to have no vitamin K. These foods  include:  Bread and cereal products.  Cheese.  Eggs.  Fish and shellfish.  Meat and poultry.  Milk and dairy products.  Sunflower seeds.  Actual amounts of vitamin K in foods may be different depending on processing. Talk with your dietitian about what foods you can eat and what foods you should avoid. This information is not intended to replace advice given to you by your health care provider. Make sure you discuss any questions you have with your health care provider. Document Released: 08/24/2009 Document Revised: 05/18/2016 Document Reviewed: 01/30/2016 Elsevier Interactive Patient Education  2017 [place]. Warfarin Coagulopathy Warfarin (Coumadin) coagulopathy refers to bleeding that may occur as a complication of the medicine warfarin. Warfarin is an oral blood thinner (anticoagulant). Warfarin is used for medical conditions where thinning of the blood is needed to prevent blood clots. What are the causes? Bleeding is the most common and most serious complication of warfarin. The amount of bleeding is related to the warfarin dose and length of treatment. In addition, bleeding complications can also occur due to:  Intentional or accidental warfarin overdose.  Underlying medical conditions.  Dietary changes.  Medicine, herbal, supplement, or alcohol interactions.  What are the signs or symptoms? Severe bleeding while on warfarin may occur from any tissue or organ. Symptoms of the blood being too thin  may include:  Bleeding from the nose or gums.  Blood in bowel movements which may appear as bright red, dark, or black tarry stools.  Blood in the urine which may appear as pink, red, or brown urine.  Unusual bruising or bruising easily.  A cut that does not stop bleeding within 10 minutes.  Vomiting blood or continuous nausea for more than 1 day.  Coughing up blood.  Broken blood vessels in your eye (subconjunctival hemorrhage).  Abdominal or back pain with  or without flank bruising.  Sudden, severe headache.  Sudden weakness or numbness of the face, arm, or leg, especially on one side of the body.  Sudden confusion.  Trouble speaking (aphasia) or understanding.  Sudden trouble seeing in one or both eyes.  Sudden trouble walking.  Dizziness.  Loss of balance or coordination.  Vaginal bleeding.  Swelling or pain at an injection site.  Superficial fat tissue death (necrosis) which may cause skin scarring. This is more common in women and may first present as pain in the waist, thighs, or buttocks.  Follow these instructions at home:  Always contact your health care provider of any concerns or signs of possible warfarin coagulopathy as soon as possible.  Take warfarin exactly as directed by your health care provider. It is recommended that you take your warfarin dose at the same time of the day. If you have been told to stop taking warfarin, do not resume taking warfarin until directed to do so by your health care provider. Follow your health care provider's instructions if you accidentally take an extra dose or miss a dose of warfarin. It is very important to take warfarin as directed since bleeding or blood clots could result in chronic or permanent injury, pain, or disability.  Keep all follow-up appointments with your health care provider as directed. It is very important to keep your appointments. Not keeping appointments could result in a chronic or permanent injury, pain, or disability because warfarin is a medicine that requires close monitoring.  While taking warfarin, you will need to have regular blood tests to measure your blood clotting time. These blood tests usually include both the prothrombin time (PT) and International Normalized Ratio (INR) tests. The PT and INR results allow your health care provider to adjust your dose of warfarin. The dose can change for many reasons. It is critically important that you have your PT  and INR levels drawn exactly as directed. Your warfarin dose may stay the same or change depending on what the PT and INR results are. Be sure to follow up with your health care provider regarding your PT and INR test results and what your warfarin dosage should be.  Many medicines can interfere with warfarin and affect the PT and INR results. You must tell your health care provider about any and all medicines you take. This includes all vitamins and supplements. Ask your health care provider before taking these. Prescription and over-the-counter medicine consistency is critical to warfarin management. It is important that potential interactions are checked before you start a new medicine. Be especially cautious with aspirin and anti-inflammatory medicines. Ask your health care provider before taking these. Medicines such as antibiotics and acid-reducing medicine can interact with warfarin and can cause an increased warfarin effect. Warfarin can also interfere with the effectiveness of medicines you are taking. Do not take or discontinue any prescribed or over-the-counter medicine except on the advice of your health care provider or pharmacist.  Some vitamins, supplements, and  herbal products interfere with the effectiveness of warfarin. Vitamin E may increase the anticoagulant effects of warfarin. Vitamin K can cause warfarin to be less effective. Do not take or discontinue any vitamin, supplement, or herbal product except on the advice of your health care provider or pharmacist.  Eat what you normally eat and keep the vitamin K content of your diet consistent. Avoid major changes in your diet, or notify your health care provider before changing your diet. Suddenly getting a lot more vitamin K could cause your blood to clot too quickly. A sudden decrease in vitamin K intake could cause your blood to clot too slowly. These changes in vitamin K intake could lead to dangerous blood clotsor to bleeding. To keep  your vitamin K intake consistent, you must be aware of which foods contain moderate or high amounts of vitamin K. Some foods that are high in vitamin K include spinach, kale, broccoli, cabbage, greens, Brussels sprouts, asparagus, bok choy, coleslaw, and parsley. If you drink green tea, drink the same amount each day. Arrange a visit with a dietitian to answer your questions.  If you have a loss of appetite or get the stomach flu (viral gastroenteritis), talk to your health care provider as soon as possible. A decrease in your normal vitamin K intake can make you more sensitive to your usual dose of warfarin.  Some medical conditions may increase your risk for bleeding while you are taking warfarin. A fever, diarrhea lasting more than a day, worsening heart failure, or worsening liver function are some medical conditions that could affect warfarin. Contact your health care provider if you have any of these medical conditions.  Be careful not to cut yourself when using sharp objects or while shaving.  Alcohol can change the body's ability to handle warfarin. It is best to avoid alcoholic drinks or consume only very small amounts while taking warfarin. Notify your health care provider if you change your alcohol intake. A sudden increase in alcohol use can increase your risk of bleeding. Chronic alcohol use can cause warfarin to be less effective.  Limit physical activities or sports that could result in a fall or cause injury.  Do not use warfarin if you are pregnant.  Inform all your health care providers and your dentist that you take warfarin.  Inform all health care providers if you are taking warfarin and aspirin or platelet inhibitor medicines such as clopidogrel, ticagrelor, or prasugrel. Use of these medicines in addition to warfarin can increase your risk of bleeding or death. Taking these medicines together should only be done under the direct care of your health care providers. Get help  right away if:  You cough up blood.  You have dark or black stools or there is bright red blood coming from your rectum.  You vomit blood or have nausea for more than 1 day.  You have blood in the urine or pink-colored urine.  You have unusual bruising or have increased bruising.  You have bleeding from the nose or gums that does not stop quickly.  You have a cut that does not stop bleeding within 2-3 minutes.  You have sudden weakness or numbness of the face, arm, or leg, especially on one side of the body.  You have sudden confusion.  You have trouble speaking (aphasia) or understanding.  You have sudden trouble seeing in one or both eyes.  You have sudden trouble walking.  You have dizziness.  You have a loss of balance or  coordination.  You have a sudden, severe headache.  You have a serious fall or head injury, even if you are not bleeding.  You have swelling or pain at an injection site.  You have unexplained tenderness or pain in the abdomen, back, waist, thighs, or buttocks. Any of these symptoms may represent a serious problem that is an emergency. Do not wait to see if the symptoms will go away. Get medical help right away. Call your local emergency services (911 in [place] Do not drive yourself to the hospital. This information is not intended to replace advice given to you by your health care provider. Make sure you discuss any questions you have with your health care provider. Document Released: 10/05/2006 Document Revised: 04/03/2016 Document Reviewed: 04/06/2012 Elsevier Interactive Patient Education  2018 [employer]. Furosemide tablets What is this medicine? FUROSEMIDE (fyoor [place] se mide) is a diuretic. It helps you make more urine and to lose salt and excess water from your body. This medicine is used to treat high blood pressure, and edema or swelling from heart, kidney, or liver disease. This medicine may be used for other purposes; ask your health care  provider or pharmacist if you have questions. COMMON BRAND NAME(S): Active-Medicated Specimen Kit, Delone, Diuscreen, Lasix, RX Specimen Collection Kit, Specimen Collection Kit, URINX Medicated Specimen Collection What should I tell my health care provider before I take this medicine? They need to know if you have any of these conditions: -abnormal blood electrolytes -diarrhea or vomiting -gout -heart disease -kidney disease, small amounts of urine, or difficulty passing urine -liver disease -thyroid disease -an unusual or allergic reaction to furosemide, sulfa drugs, other medicines, foods, dyes, or preservatives -pregnant or trying to get pregnant -breast-feeding How should I use this medicine? Take this medicine by mouth with a glass of water. Follow the directions on the prescription label. You may take this medicine with or without food. If it upsets your stomach, take it with food or milk. Do not take your medicine more often than directed. Remember that you will need to pass more urine after taking this medicine. Do not take your medicine at a time of day that will cause you problems. Do not take at bedtime. Talk to your pediatrician regarding the use of this medicine in children. While this drug may be prescribed for selected conditions, precautions do apply. Overdosage: If you think you have taken too much of this medicine contact a poison control center or emergency room at once. NOTE: This medicine is only for you. Do not share this medicine with others. What if I miss a dose? If you miss a dose, take it as soon as you can. If it is almost time for your next dose, take only that dose. Do not take double or extra doses. What may interact with this medicine? -aspirin and aspirin-like medicines -certain antibiotics -chloral hydrate -cisplatin -cyclosporine -digoxin -diuretics -laxatives -lithium -medicines for blood pressure -medicines that relax muscles for  surgery -methotrexate -NSAIDs, medicines for pain and inflammation like ibuprofen, naproxen, or indomethacin -phenytoin -steroid medicines like prednisone or cortisone -sucralfate -thyroid hormones This list may not describe all possible interactions. Give your health care provider a list of all the medicines, herbs, non-prescription drugs, or dietary supplements you use. Also tell them if you smoke, drink alcohol, or use illegal drugs. Some items may interact with your medicine. What should I watch for while using this medicine? Visit your doctor or health care professional for regular checks on your progress.  Check your blood pressure regularly. Ask your doctor or health care professional what your blood pressure should be, and when you should contact him or her. If you are a diabetic, check your blood sugar as directed. You may need to be on a special diet while taking this medicine. Check with your doctor. Also, ask how many glasses of fluid you need to drink a day. You must not get dehydrated. You may get drowsy or dizzy. Do not drive, use machinery, or do anything that needs mental alertness until you know how this drug affects you. Do not stand or sit up quickly, especially if you are an older patient. This reduces the risk of dizzy or fainting spells. Alcohol can make you more drowsy and dizzy. Avoid alcoholic drinks. This medicine can make you more sensitive to the sun. Keep out of the sun. If you cannot avoid being in the sun, wear protective clothing and use sunscreen. Do not use sun lamps or tanning beds/booths. What side effects may I notice from receiving this medicine? Side effects that you should report to your doctor or health care professional as soon as possible: -blood in urine or stools -dry mouth -fever or chills -hearing loss or ringing in the ears -irregular heartbeat -muscle pain or weakness, cramps -skin rash -stomach upset, pain, or nausea -tingling or numbness in  the hands or feet -unusually weak or tired -vomiting or diarrhea -yellowing of the eyes or skin Side effects that usually do not require medical attention (report to your doctor or health care professional if they continue or are bothersome): -headache -loss of appetite -unusual bleeding or bruising This list may not describe all possible side effects. Call your doctor for medical advice about side effects. You may report side effects to FDA at 1-800-FDA-1088. Where should I keep my medicine? Keep out of the reach of children. Store at room temperature between 15 and 30 degrees C (59 and 86 degrees F). Protect from light. Throw away any unused medicine after the expiration date. NOTE: This sheet is a summary. It may not cover all possible information. If you have questions about this medicine, talk to your doctor, pharmacist, or health care provider.  2018 Elsevier/Gold Standard (2015-01-17 13:49:50)  [facility] appointment on October 15 2017 at 12:20pm with Darylene Price, [place]. Please call [phone] to reschedule.  Heart healthy diet. HHPT.

## 2017-10-09 NOTE — Care Management Important Message (Signed)
Important Message  Patient Details  Name: Yolanda Wells MRN: [id number] Date of Birth: [birth year]   Medicare Important Message Given:  Yes  Signed IM notice given   Katrina Stack, RN 10/09/2017, 9:41 AM

## 2017-10-09 NOTE — Progress Notes (Signed)
SUBJECTIVE: Patient is feeling much better denies any chest pain or shortness of breath   Vitals:   10/08/17 1717 10/08/17 1955 10/09/17 0303 10/09/17 0800  BP: (!) 116/56 119/69 133/73 126/79  Pulse: 71 97 97 98  Resp:   18 19  Temp:  98.1 F (36.7 C) 98.2 F (36.8 C) 98.4 F (36.9 C)  TempSrc:  Oral Oral Oral  SpO2: 94% 99% 95% 98%  Weight:   208 lb 1.6 oz (94.4 kg)   Height:        Intake/Output Summary (Last 24 hours) at 10/09/2017 0853 Last data filed at 10/08/2017 1840 Gross per 24 hour  Intake 480 ml  Output 1900 ml  Net -1420 ml    LABS: Basic Metabolic Panel: Recent Labs    10/08/17 0558 10/09/17 0613  NA 138 136  K 3.6 3.3*  CL 103 99*  CO2 29 27  GLUCOSE 101* 101*  BUN 10 19  CREATININE 0.55 0.57  CALCIUM 8.6* 8.7*   Liver Function Tests: No results for input(s): AST, ALT, ALKPHOS, BILITOT, PROT, ALBUMIN in the last 72 hours. No results for input(s): LIPASE, AMYLASE in the last 72 hours. CBC: Recent Labs    10/08/17 0558 10/09/17 0613  WBC 1.9* 2.0*  NEUTROABS  --  0.8*  HGB 12.3 12.3  HCT 38.3 38.9  MCV 82.0 82.1  PLT 536* 580*   Cardiac Enzymes: No results for input(s): CKTOTAL, CKMB, CKMBINDEX, TROPONINI in the last 72 hours. BNP: Invalid input(s): POCBNP D-Dimer: No results for input(s): DDIMER in the last 72 hours. Hemoglobin A1C: No results for input(s): HGBA1C in the last 72 hours. Fasting Lipid Panel: No results for input(s): CHOL, HDL, LDLCALC, TRIG, CHOLHDL, LDLDIRECT in the last 72 hours. Thyroid Function Tests: No results for input(s): TSH, T4TOTAL, T3FREE, THYROIDAB in the last 72 hours.  Invalid input(s): FREET3 Anemia Panel: No results for input(s): VITAMINB12, FOLATE, FERRITIN, TIBC, IRON, RETICCTPCT in the last 72 hours.   PHYSICAL EXAM General: Well developed, well nourished, in no acute distress HEENT:  Normocephalic and atramatic Neck:  No JVD.  Lungs: Clear bilaterally to auscultation and percussion. Heart:  HRRR . Normal S1 and S2 without gallops or murmurs.  Abdomen: Bowel sounds are positive, abdomen soft and non-tender  Msk:  Back normal, normal gait. Normal strength and tone for age. Extremities: No clubbing, cyanosis or edema.   Neuro: Alert and oriented X 3. Psych:  Good affect, responds appropriately  TELEMETRY: Sinus rhythm  ASSESSMENT AND PLAN: Congestive heart failure due to diastolic dysfunction with normal ejection fraction. Patient is clinically significantly improved since admission and has history of pulmonary hypertension due to pulmonary embolism. INR appears to be therapeutic now. Patient can be discharged with follow-up in the office at 10 AM on Monday.  Active Problems:   Acute CHF (congestive heart failure) (HCC)    Sariya Trickey A, MD, [facility] 10/09/2017 8:53 AM

## 2017-10-09 NOTE — Progress Notes (Signed)
ANTICOAGULATION CONSULT NOTE - Initial Consult  Pharmacy Consult for warfarin dosing  Indication: pulmonary embolus and DVT  Allergies  Allergen Reactions  . Sulfamethoxazole-Trimethoprim Nausea And Vomiting   Patient Measurements: Height: 5\' 7"  (170.2 cm) Weight: 208 lb 1.6 oz (94.4 kg) IBW/kg (Calculated) : 61.6  Vital Signs: Temp: 98.4 F (36.9 C) (11/30 0800) Temp Source: Oral (11/30 0800) BP: 126/79 (11/30 0800) Pulse Rate: 98 (11/30 0800)  Labs: Recent Labs    10/07/17 0512 10/08/17 0558 10/09/17 0613  HGB  --  12.3 12.3  HCT  --  38.3 38.9  PLT  --  536* 580*  LABPROT 17.6* 19.5* 20.2*  INR 1.46 1.66 1.74  CREATININE 0.42* 0.55 0.57    Estimated Creatinine Clearance: 62.8 mL/min (by C-G formula based on SCr of 0.57 mg/dL).  Medical History: Past Medical History:  Diagnosis Date  . Acute deep vein thrombosis (DVT) of distal vein of left lower extremity ([place]) 10/03/2015  . Acute pulmonary embolism ([place]) 10/03/2015  . Anticoagulant long-term use   . Arthritis   . Chronic anticoagulation 10/03/2015  . Collagen vascular disease ([place])   . Colostomy in place [facility]) 10/04/2015  . Deep venous thrombosis (HCC)    right lower extremity  . Dependent edema   . Diverticulosis of intestine with bleeding 04/02/2015  . Glenohumeral arthritis 06/28/2012  . Hammertoe 09/02/2012  . History of bilateral hip replacements 09/02/2012  . History of hysterectomy   . Hypertension   . Hypokalemia   . Inability to ambulate due to hip 10/12/2015  . Mandibular dysfunction   . Onychomycosis 09/02/2012  . Osteoarthritis   . Polycythemia vera ([place])   . Presence of IVC filter 05/25/2015  . Stroke ([place]) 03/26/2015   cerebellar  . Urinary incontinence in female 09/02/2016    Assessment: Patient baseline INR 1.39, pharmacy consulted for warfarin management of DVT/PE. Home dose warfarin 3.5mg  three days per week and warfarin 4mg  other days of the week. Adherence is not known. Pt  INR was low on 11/9 and dose was increased to current dose.  Dosing History Date INR Dose 11/27 1.4 4 mg 11/28 1.5 6 mg 11/29 1.65 4 mg 11/30 1.75  Goal of Therapy:  INR 2-3 Monitor platelets by anticoagulation protocol: Yes   Plan:  INR is increasing. Continue new dose of 4mg  daily.  Recheck INR in the AM.  Continue therapeutic enoxaparin 1mg /kg q 12 hr until INR is therapeutic  Lenis Noon, Pharm.D, BCPS Clinical Pharmacist 10/09/2017,9:30 AM

## 2017-10-09 NOTE — Progress Notes (Signed)
Patient DC via stretcher and EMS to home. Granddaughter at the bedside and given discharge instructions. Verbalized understanding. Tele box off and returned IV removed. Patient stable at time of transport.

## 2017-10-12 ENCOUNTER — Inpatient Hospital Stay (HOSPITAL_BASED_OUTPATIENT_CLINIC_OR_DEPARTMENT_OTHER): Payer: Medicare Other | Admitting: Hematology and Oncology

## 2017-10-12 ENCOUNTER — Inpatient Hospital Stay: Payer: Medicare Other | Admitting: Hematology and Oncology

## 2017-10-12 ENCOUNTER — Other Ambulatory Visit: Payer: Medicare Other

## 2017-10-12 ENCOUNTER — Inpatient Hospital Stay: Payer: Medicare Other | Attending: Hematology and Oncology

## 2017-10-12 ENCOUNTER — Ambulatory Visit: Payer: Medicare Other | Admitting: Hematology and Oncology

## 2017-10-12 ENCOUNTER — Encounter: Payer: Self-pay | Admitting: Hematology and Oncology

## 2017-10-12 ENCOUNTER — Other Ambulatory Visit: Payer: Self-pay | Admitting: *Deleted

## 2017-10-12 VITALS — BP 132/82 | HR 67 | Temp 96.8°F | Resp 18

## 2017-10-12 DIAGNOSIS — Z87440 Personal history of urinary (tract) infections: Secondary | ICD-10-CM | POA: Insufficient documentation

## 2017-10-12 DIAGNOSIS — Z7982 Long term (current) use of aspirin: Secondary | ICD-10-CM

## 2017-10-12 DIAGNOSIS — Z8673 Personal history of transient ischemic attack (TIA), and cerebral infarction without residual deficits: Secondary | ICD-10-CM | POA: Insufficient documentation

## 2017-10-12 DIAGNOSIS — M1990 Unspecified osteoarthritis, unspecified site: Secondary | ICD-10-CM

## 2017-10-12 DIAGNOSIS — I509 Heart failure, unspecified: Secondary | ICD-10-CM | POA: Diagnosis not present

## 2017-10-12 DIAGNOSIS — Z882 Allergy status to sulfonamides status: Secondary | ICD-10-CM | POA: Insufficient documentation

## 2017-10-12 DIAGNOSIS — R600 Localized edema: Secondary | ICD-10-CM | POA: Insufficient documentation

## 2017-10-12 DIAGNOSIS — I110 Hypertensive heart disease with heart failure: Secondary | ICD-10-CM

## 2017-10-12 DIAGNOSIS — Z933 Colostomy status: Secondary | ICD-10-CM | POA: Diagnosis not present

## 2017-10-12 DIAGNOSIS — R32 Unspecified urinary incontinence: Secondary | ICD-10-CM | POA: Diagnosis not present

## 2017-10-12 DIAGNOSIS — Z86711 Personal history of pulmonary embolism: Secondary | ICD-10-CM

## 2017-10-12 DIAGNOSIS — Z7901 Long term (current) use of anticoagulants: Secondary | ICD-10-CM | POA: Insufficient documentation

## 2017-10-12 DIAGNOSIS — D45 Polycythemia vera: Secondary | ICD-10-CM

## 2017-10-12 DIAGNOSIS — H02009 Unspecified entropion of unspecified eye, unspecified eyelid: Secondary | ICD-10-CM

## 2017-10-12 DIAGNOSIS — I2699 Other pulmonary embolism without acute cor pulmonale: Secondary | ICD-10-CM

## 2017-10-12 DIAGNOSIS — Z86718 Personal history of other venous thrombosis and embolism: Secondary | ICD-10-CM | POA: Insufficient documentation

## 2017-10-12 DIAGNOSIS — Z808 Family history of malignant neoplasm of other organs or systems: Secondary | ICD-10-CM | POA: Insufficient documentation

## 2017-10-12 DIAGNOSIS — D709 Neutropenia, unspecified: Secondary | ICD-10-CM | POA: Diagnosis not present

## 2017-10-12 DIAGNOSIS — Z79899 Other long term (current) drug therapy: Secondary | ICD-10-CM | POA: Diagnosis not present

## 2017-10-12 DIAGNOSIS — I82509 Chronic embolism and thrombosis of unspecified deep veins of unspecified lower extremity: Secondary | ICD-10-CM

## 2017-10-12 LAB — CBC WITH DIFFERENTIAL/PLATELET
BASOS PCT: 3 %
Basophils Absolute: 0.1 10*3/uL (ref 0–0.1)
EOS PCT: 1 %
Eosinophils Absolute: 0 10*3/uL (ref 0–0.7)
HCT: 42.4 % (ref 35.0–47.0)
HEMOGLOBIN: 13.5 g/dL (ref 12.0–16.0)
LYMPHS ABS: 0.7 10*3/uL — AB (ref 1.0–3.6)
Lymphocytes Relative: 21 %
MCH: 26.4 pg (ref 26.0–34.0)
MCHC: 31.9 g/dL — ABNORMAL LOW (ref 32.0–36.0)
MCV: 82.8 fL (ref 80.0–100.0)
Monocytes Absolute: 0.4 10*3/uL (ref 0.2–0.9)
Monocytes Relative: 10 %
NEUTROS ABS: 2.2 10*3/uL (ref 1.4–6.5)
Neutrophils Relative %: 62 %
Other: 3 %
Platelets: 566 10*3/uL — ABNORMAL HIGH (ref 150–400)
RBC: 5.12 MIL/uL (ref 3.80–5.20)
RDW: 32.3 % — ABNORMAL HIGH (ref 11.5–14.5)
WBC: 3.5 10*3/uL — ABNORMAL LOW (ref 3.6–11.0)

## 2017-10-12 LAB — PATHOLOGIST SMEAR REVIEW

## 2017-10-12 LAB — PROTIME-INR
INR: 1.52
Prothrombin Time: 18.2 seconds — ABNORMAL HIGH (ref 11.4–15.2)

## 2017-10-12 NOTE — Progress Notes (Signed)
Pt in today for following discharged from hospital on 10/09/17

## 2017-10-12 NOTE — Progress Notes (Signed)
[facility] day:  10/12/2017   Chief Complaint: Yolanda Wells is a 83 y.o. female with polycythemia rubra vera (PV) who is seen for 2 month assessment and evaluation after recent hospitlaization.  HPI:   The patient was last seen in the medical oncology clinic on 08/13/2017.  At that time, she felt "fine". She was taking hydroxyurea 25 pills/week.  Exam revealed chronic right sided entropion.  Hematocrit was 41.1  Platelet count was 595,000.  INR was 1.94.  She did not undergo phlebotomy.  Hydrea was increased to 26 pills/week.  Coumadin 3.5 mg a day continued.  She has had labs drawn through [facility] home health.  She was admitted to [facility] from 09/26/2017 - 10/09/2017 with shortness of breath.   She had gained 7 pounds of weight and was unable to sleep secondary to shortness of breath. She also had a recent E coli UTI which had not been treated in the outpatient department.  She was treated with Lasix.  Echo revealed an EF of 60%.  CXR suggested pulmonary artery hypertension.    CBC on admission included a hematocrit of 40.6, hemoglobin 12.9, MCV 82.4, platelets 610,000 and WBC 2100 with an [place] of 900.  She has continued her hydroxyurea.  PT/INR was 16.9/1.39 on admission.  She was placed on Lovenox and continued Coumadin.  CBC on discharge revealed a hematocrit of 38.9, hemoglobin 12.3, MCV 82.1, platelets 580,000, white count 2000 with an ANC of 800. PT/INR was 20.2/1.74.  Hydroxyurea was on hold secondary to neutropenia.  She was taking Coumadin 4 mg a day.  Duing the interim, patient feels "fine". Patient denies any physical complaints today. She denies bruising or bleeding. She denies fevers, sweats, and weight loss.   Patient was seen in consult by cardiology today. Patient's family advising that patient had EKG consistent with an old MI. Patient was subsequently scheduled for a CT angio on 10/16/2017. Patient states, "You people use old folks to  experiment on". Patient continues on Hydrea (26 pills/week); 3 pills on Monday and Wednesday, and 4 pills on Tuesday, Thursday, Friday, Saturday, and Sunday.   Patient on Coumadin 4mg  daily.  She denies any bruising or bleeding.   Past Medical History:  Diagnosis Date  . Acute deep vein thrombosis (DVT) of distal vein of left lower extremity ([place]) 10/03/2015  . Acute pulmonary embolism ([place]) 10/03/2015  . Anticoagulant long-term use   . Arthritis   . Chronic anticoagulation 10/03/2015  . Collagen vascular disease ([place])   . Colostomy in place [facility]) 10/04/2015  . Deep venous thrombosis ([facility])    right lower extremity  . Dependent edema   . Diverticulosis of intestine with bleeding 04/02/2015  . Glenohumeral arthritis 06/28/2012  . Hammertoe 09/02/2012  . History of bilateral hip replacements 09/02/2012  . History of hysterectomy   . Hypertension   . Hypokalemia   . Inability to ambulate due to hip 10/12/2015  . Mandibular dysfunction   . Onychomycosis 09/02/2012  . Osteoarthritis   . Polycythemia vera ([place])   . Presence of IVC filter 05/25/2015  . 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 Urinary incontinence in female 09/02/2016    Past Surgical History:  Procedure Laterality Date  . ABDOMINAL HYSTERECTOMY    . BLEPHAROPLASTY Right 03/2017   right upper eyelid  . CARDIAC CATHETERIZATION Right 04/03/2015   Procedure: CENTRAL LINE INSERTION;  Surgeon: Sherri Rad, MD;  Location: [facility] ORS;  Service: General;  Laterality: Right;  .  COLECTOMY WITH COLOSTOMY CREATION/HARTMANN PROCEDURE N/A 04/03/2015   Procedure: COLECTOMY WITH COLOSTOMY CREATION/HARTMANN PROCEDURE;  Surgeon: Sherri Rad, MD;  Location: [facility] ORS;  Service: General;  Laterality: N/A;  . FOOT AMPUTATION     partial  . FOOT AMPUTATION Right   . JOINT REPLACEMENT     Left and Right Hip  . PERIPHERAL VASCULAR CATHETERIZATION N/A 04/02/2015   Procedure: Visceral Angiography;  Surgeon: Algernon Huxley, MD;  Location: [employer] CV LAB;  Service: Cardiovascular;  Laterality: N/A;  . PERIPHERAL VASCULAR CATHETERIZATION N/A 04/02/2015   Procedure: Visceral Artery Intervention;  Surgeon: Algernon Huxley, MD;  Location: [place] CV LAB;  Service: Cardiovascular;  Laterality: N/A;  . PERIPHERAL VASCULAR CATHETERIZATION N/A 05/25/2015   Procedure: IVC Filter Insertion;  Surgeon: Katha Cabal, MD;  Location: [place] [facility];  Service: Cardiovascular;  Laterality: N/A;  . TOE AMPUTATION     right  . TOTAL HIP ARTHROPLASTY      Family History  Problem Relation Age of Onset  . Brain cancer Mother   . Heart attack Father   . COPD Father   . Coronary artery disease Father   . Hypertension Unknown   . Arthritis-Osteo Unknown   . Diabetes Brother   . Arthritis Brother   . Osteosarcoma Son   . Bone cancer Son   . Arthritis Sister   . Arthritis Brother   . Hypertension Brother   . Coronary artery disease Brother     Social History:  reports that  has never smoked. she has never used smokeless tobacco. She reports that she does not drink alcohol or use drugs.  She lives alone on [address] at [facility] (independent living).  She moved out of [facility].  She has been living with her grand daughter since 02/06/2017.  Her grand-daughter visits regularly [name] [phone]).  The patient is accompanied by her grand daughter, [name], today.   Allergies:  Allergies  Allergen Reactions  . Sulfamethoxazole-Trimethoprim Nausea And Vomiting    Current Medications: Current Outpatient Medications  Medication Sig Dispense Refill  . aspirin EC 81 MG tablet Take 1 tablet (81 mg total) by mouth daily. 30 tablet 2  . docusate sodium (COLACE) 100 MG capsule Take 100 mg by mouth 2 (two) times daily.    [name] ENSURE (ENSURE) Take 1 Can by mouth 3 (three) times daily with meals.    . folic acid (FOLVITE) 269 MCG tablet Take 400 mcg by mouth daily.    . furosemide (LASIX) 20 MG tablet Take 1 tablet (20 mg  total) by mouth 2 (two) times daily. 60 tablet 0  . polyethylene glycol (MIRALAX / GLYCOLAX) packet Take 17 g by mouth daily.    . potassium chloride (K-DUR,KLOR-CON) 10 MEQ tablet Take 10 mEq by mouth 2 (two) times a week. On Monday and Thursday  With lasix for potassium replacement    . warfarin (COUMADIN) 4 MG tablet Take 1 tablet (4 mg total) by mouth daily at 6 PM. 30 tablet 0  . amLODipine (NORVASC) 5 MG tablet Take 1 tablet (5 mg total) by mouth daily. (Patient not taking: Reported on 10/06/2017) 30 tablet 11  . hydroxyurea (HYDREA) 500 MG capsule TAKE FOUR CAPSULES BY MOUTH DAILY (Patient not taking: Reported on 10/12/2017) 120 capsule 0  . triamcinolone cream (KENALOG) 0.1 % Apply 1 application topically 2 (two) times daily. To affected area     No current facility-administered medications for this visit.  Review of Systems:  GENERAL:  Feels "fine".  No fevers or sweats.  Weight down 24 pounds in 6 months. No new weight obtained today.  PERFORMANCE STATUS (ECOG):  3 HEENT:  Decreased vision in right eye s/p embolic event.  Vision better s/p entropion surgery.  No runny nose, sore throat, mouth sores or tenderness. Lungs:  No shortness of breath or cough.  No hemoptysis. Cardiac:  No chest pain, palpitations, or PND.  Orthopnea.  Interval admission for CHF. GI:  No nausea, vomiting, diarrhea, constipation, melena or hematochezia.  Interval blood in stool, resolved. GU:  No urgency, frequency, dysuria, or hematuria.  Incontinent.  Wears adult diapers. Musculoskeletal:  Knee problems ("bone on bone").  No muscle tenderness. Extremities:  Chronic swelling in legs. Skin:  Dry skin.  No excess bruising. Neuro:  No headache, numbness or weakness, balance or coordination issues. Endocrine:  No diabetes, thyroid issues, hot flashes or night sweats. Psych:  No mood changes, depression or anxiety. Pain:  No focal pain. Review of systems:  All other systems reviewed and found to be  negative.  Physical Exam: Blood pressure 132/82, pulse 67, temperature (!) 96.8 F (36 C), temperature source Tympanic, resp. rate 18. GENERAL:  Elderly woman sitting comfortably in a wheelchair in the exam room in no acute distress. MENTAL STATUS:  Alert and oriented to person, place and time. HEAD: Wearing a black cap.  Gray hair.  Normocephalic, atraumatic, face symmetric, no Cushingoid features. EYES:  Brown eyes.  Right sided entropion.  Pupils equal round and reactive to light and accomodation.  No conjunctivitis or scleral icterus. ENT:  Oropharynx clear without lesion.  Upper dentures.  Tongue normal. Mucous membranes moist.  RESPIRATORY:  Clear to auscultation without rales, wheezes or rhonchi. CARDIOVASCULAR:  Regular rate and rhythm without murmur, rub or gallop. ABDOMEN:  Left sided colostomy.  Stool brown.  Soft, non-tender, with active bowel sounds, and no appreciable hepatosplenomegaly.  No masses. SKIN:  Right side of face thickened and dark.  No rashes or ulcers. EXTREMITIES:  Chronic lower extremity changes.  No skin discoloration or tenderness.  No palpable cords. LYMPH NODES: No palpable cervical, supraclavicular, axillary or inguinal adenopathy  NEUROLOGICAL: Unremarkable. PSYCH:  Appropriate.   Appointment on 10/12/2017  Component Date Value Ref Range Status  . Prothrombin Time 10/12/2017 18.2* 11.4 - 15.2 seconds Final  . INR 10/12/2017 1.52   Final  . WBC 10/12/2017 3.5* 3.6 - 11.0 K/uL Final  . RBC 10/12/2017 5.12  3.80 - 5.20 MIL/uL Final  . Hemoglobin 10/12/2017 13.5  12.0 - 16.0 g/dL Final  . HCT 10/12/2017 42.4  35.0 - 47.0 % Final  . MCV 10/12/2017 82.8  80.0 - 100.0 fL Final  . MCH 10/12/2017 26.4  26.0 - 34.0 pg Final  . MCHC 10/12/2017 31.9* 32.0 - 36.0 g/dL Final  . RDW 10/12/2017 32.3* 11.5 - 14.5 % Final  . Platelets 10/12/2017 566* 150 - 440 K/uL Final  . Neutrophils Relative % 10/12/2017 PENDING  % Incomplete  . Neutro Abs 10/12/2017 PENDING   1.7 - 7.7 K/uL Incomplete  . Band Neutrophils 10/12/2017 PENDING  % Incomplete  . Lymphocytes Relative 10/12/2017 PENDING  % Incomplete  . Lymphs Abs 10/12/2017 PENDING  0.7 - 4.0 K/uL Incomplete  . Monocytes Relative 10/12/2017 PENDING  % Incomplete  . Monocytes Absolute 10/12/2017 PENDING  0.1 - 1.0 K/uL Incomplete  . Eosinophils Relative 10/12/2017 PENDING  % Incomplete  . Eosinophils Absolute 10/12/2017 PENDING  0.0 - 0.7  K/uL Incomplete  . Basophils Relative 10/12/2017 PENDING  % Incomplete  . Basophils Absolute 10/12/2017 PENDING  0.0 - 0.1 K/uL Incomplete  . WBC Morphology 10/12/2017 PENDING   Incomplete  . RBC Morphology 10/12/2017 PENDING   Incomplete  . Smear Review 10/12/2017 PENDING   Incomplete  . Other 10/12/2017 PENDING  % Incomplete  . nRBC 10/12/2017 PENDING  0 /100 WBC Incomplete  . Metamyelocytes Relative 10/12/2017 PENDING  % Incomplete  . Myelocytes 10/12/2017 PENDING  % Incomplete  . Promyelocytes Absolute 10/12/2017 PENDING  % Incomplete  . Blasts 10/12/2017 PENDING  % Incomplete    Assessment:  Yolanda Wells is a 83 y.o. female African-American woman with JAK2+ polycythemia rubra vera (PV) previously on a phlebotomy program and hydroxyurea. She received P32 in an attempt to manage her counts in 03/2015 and on 05/14/2017. [name]   Course has been complicated by a cerebellar CVA on 04/01/2015, splenic flexure bleeding requiring micro-embolization then colectomy on 04/03/2015. She was diagnosed with bilateral lower extremity DVTs on 05/18/2015 and bilateral pulmonary emboli on 05/24/2015. She underwent IVC filter placement on 05/25/2015.  She has been on a fluctuating dose of Coumadin secondary to unstable INR.  She is on a baby aspirin.  She developed progressive erythrocytosis, thrombocytosis, and leukocytosis.  She underwent phlebotomy for a hematocrit of 55.4 on 09/23/2015.  Hematocrit decreased to 50.0, but has again increased after initiation of oral iron.   Platelet count has increased from 1.1 million to 1.4 million.  White count increased from 23,000 - 28,000 to 31,800.  She was admitted on 08/25/2016 with loss of vision in her right eye.  CBC on 08/25/2016 revealed a hematocrit of 40.0, hemoglobin 13.5, MCV 91, platelets 842,000, WBC 4900.  INR was 2.59.  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  MRI and MRA showed no acute changes.  She has been back on hydroxyurea since 10/02/2015.  Initial dose was 1000 mg a day.  She is currently taking 4 pills 4 days/week and 3 pills 3 days/week (total weekly dose: 24 pills).   She requires periodic phlebotomies (goal hematocrit <= 42).  Platelet count remains elevated (secondary to PV and likely some component of iron deficiency).  Goal platelet count is 400,000.  She is on Coumadin 4 mg daily  (total weekly dose 28 mg).  INR goal 2-3.  She was admitted to [facility] for 2.5 weeks with pneumonia then returned 07/03/2017 - 07/12/2017 with CHF.  She was admitted to [facility] from 09/26/2017 - 10/09/2017 with symptoms of heart failure and an E coli UTI.  She was treated with Lasix and ceftriaxone.  Echo revealed an EF of 60%.  CXR suggested pulmonary artery hypertension.    Symptomatically, she feels "fine". She is taking hydroxyurea 26 pills/week.  Exam reveals chronic right sided entropion.  Hematocrit is 42.4  Platelet count is 566,000.  INR is 1.52  Plan: 1.  Labs today:  CBC with diff, CMP, PT/INR. 2.  Discuss platelet goal of < 400,000. Continue Hydroxyurea 500mg  to 3 pills x 5 days week, and 4 pills x 2 days a week (total weekly dose of 23 pills). 3.  Schedule small volume phlebotomy on 10/16/2017 as hematocrit > 42.   4.  INR 1.52. Increase Coumadin to 5mg  on Monday, Wednesday, and Friday, then 4mg  on Tuesday, Thursday, Saturday, and Sunday.  5.  Set up [facility] home health for labs: (PT/INR, CBC with diff) weekly starting next week.  6.  RTC in  6 weeks for  MD assessment and labs (CBC with diff, CMP, PT/INR) +/- phlebotomy.   Honor Loh, NP  10/12/2017, 3:47 PM   I saw and evaluated the patient, participating in the key portions of the service and reviewing pertinent diagnostic studies and records.  I reviewed the nurse practitioner's note and agree with the findings and the plan.  The assessment and plan were discussed with the patient.  Multiple questions were asked by the patient and answered.   Nolon Stalls, MD 10/12/2017, 3:47 PM

## 2017-10-13 ENCOUNTER — Encounter: Payer: Self-pay | Admitting: Emergency Medicine

## 2017-10-13 ENCOUNTER — Emergency Department: Payer: Medicare Other

## 2017-10-13 ENCOUNTER — Other Ambulatory Visit: Payer: Self-pay

## 2017-10-13 ENCOUNTER — Inpatient Hospital Stay
Admission: EM | Admit: 2017-10-13 | Discharge: 2017-10-16 | DRG: 189 | Disposition: A | Discharge status: Skilled Nursing Facility | Payer: Medicare Other | Attending: Internal Medicine | Admitting: Internal Medicine

## 2017-10-13 DIAGNOSIS — R531 Weakness: Secondary | ICD-10-CM

## 2017-10-13 DIAGNOSIS — R079 Chest pain, unspecified: Secondary | ICD-10-CM | POA: Diagnosis present

## 2017-10-13 DIAGNOSIS — Z7901 Long term (current) use of anticoagulants: Secondary | ICD-10-CM

## 2017-10-13 DIAGNOSIS — J449 Chronic obstructive pulmonary disease, unspecified: Secondary | ICD-10-CM | POA: Diagnosis present

## 2017-10-13 DIAGNOSIS — Z7189 Other specified counseling: Secondary | ICD-10-CM | POA: Diagnosis not present

## 2017-10-13 DIAGNOSIS — I272 Pulmonary hypertension, unspecified: Secondary | ICD-10-CM | POA: Diagnosis present

## 2017-10-13 DIAGNOSIS — I10 Essential (primary) hypertension: Secondary | ICD-10-CM | POA: Diagnosis present

## 2017-10-13 DIAGNOSIS — I252 Old myocardial infarction: Secondary | ICD-10-CM | POA: Diagnosis not present

## 2017-10-13 DIAGNOSIS — Z95828 Presence of other vascular implants and grafts: Secondary | ICD-10-CM

## 2017-10-13 DIAGNOSIS — M26609 Unspecified temporomandibular joint disorder, unspecified side: Secondary | ICD-10-CM | POA: Diagnosis present

## 2017-10-13 DIAGNOSIS — I110 Hypertensive heart disease with heart failure: Secondary | ICD-10-CM | POA: Diagnosis present

## 2017-10-13 DIAGNOSIS — Z7982 Long term (current) use of aspirin: Secondary | ICD-10-CM

## 2017-10-13 DIAGNOSIS — Z66 Do not resuscitate: Secondary | ICD-10-CM | POA: Diagnosis present

## 2017-10-13 DIAGNOSIS — Z86711 Personal history of pulmonary embolism: Secondary | ICD-10-CM

## 2017-10-13 DIAGNOSIS — M19019 Primary osteoarthritis, unspecified shoulder: Secondary | ICD-10-CM | POA: Diagnosis present

## 2017-10-13 DIAGNOSIS — I712 Thoracic aortic aneurysm, without rupture: Secondary | ICD-10-CM | POA: Diagnosis present

## 2017-10-13 DIAGNOSIS — Z882 Allergy status to sulfonamides status: Secondary | ICD-10-CM

## 2017-10-13 DIAGNOSIS — Z515 Encounter for palliative care: Secondary | ICD-10-CM

## 2017-10-13 DIAGNOSIS — Q211 Atrial septal defect: Secondary | ICD-10-CM

## 2017-10-13 DIAGNOSIS — M359 Systemic involvement of connective tissue, unspecified: Secondary | ICD-10-CM | POA: Diagnosis present

## 2017-10-13 DIAGNOSIS — I5032 Chronic diastolic (congestive) heart failure: Secondary | ICD-10-CM | POA: Diagnosis present

## 2017-10-13 DIAGNOSIS — R32 Unspecified urinary incontinence: Secondary | ICD-10-CM | POA: Diagnosis present

## 2017-10-13 DIAGNOSIS — Z933 Colostomy status: Secondary | ICD-10-CM

## 2017-10-13 DIAGNOSIS — R0902 Hypoxemia: Secondary | ICD-10-CM | POA: Diagnosis not present

## 2017-10-13 DIAGNOSIS — I4510 Unspecified right bundle-branch block: Secondary | ICD-10-CM | POA: Diagnosis present

## 2017-10-13 DIAGNOSIS — Z86718 Personal history of other venous thrombosis and embolism: Secondary | ICD-10-CM

## 2017-10-13 DIAGNOSIS — R0602 Shortness of breath: Secondary | ICD-10-CM | POA: Diagnosis not present

## 2017-10-13 DIAGNOSIS — M1990 Unspecified osteoarthritis, unspecified site: Secondary | ICD-10-CM | POA: Diagnosis present

## 2017-10-13 DIAGNOSIS — G839 Paralytic syndrome, unspecified: Secondary | ICD-10-CM | POA: Diagnosis present

## 2017-10-13 DIAGNOSIS — Z96643 Presence of artificial hip joint, bilateral: Secondary | ICD-10-CM | POA: Diagnosis present

## 2017-10-13 DIAGNOSIS — J9601 Acute respiratory failure with hypoxia: Principal | ICD-10-CM | POA: Diagnosis present

## 2017-10-13 DIAGNOSIS — D45 Polycythemia vera: Secondary | ICD-10-CM | POA: Diagnosis present

## 2017-10-13 DIAGNOSIS — Z89431 Acquired absence of right foot: Secondary | ICD-10-CM

## 2017-10-13 DIAGNOSIS — Z8673 Personal history of transient ischemic attack (TIA), and cerebral infarction without residual deficits: Secondary | ICD-10-CM

## 2017-10-13 DIAGNOSIS — Z79899 Other long term (current) drug therapy: Secondary | ICD-10-CM

## 2017-10-13 HISTORY — DX: Heart failure, unspecified: I50.9

## 2017-10-13 LAB — CBC WITH DIFFERENTIAL/PLATELET
BASOS ABS: 0.1 10*3/uL (ref 0–0.1)
Basophils Relative: 1 %
Eosinophils Absolute: 0.1 10*3/uL (ref 0–0.7)
Eosinophils Relative: 1 %
HCT: 46.2 % (ref 35.0–47.0)
HEMOGLOBIN: 14.5 g/dL (ref 12.0–16.0)
LYMPHS PCT: 20 %
Lymphs Abs: 1.2 10*3/uL (ref 1.0–3.6)
MCH: 26.4 pg (ref 26.0–34.0)
MCHC: 31.4 g/dL — AB (ref 32.0–36.0)
MCV: 84.1 fL (ref 80.0–100.0)
MONOS PCT: 4 %
Monocytes Absolute: 0.2 10*3/uL (ref 0.2–0.9)
NEUTROS PCT: 74 %
Neutro Abs: 4.4 10*3/uL (ref 1.4–6.5)
Platelets: 620 10*3/uL — ABNORMAL HIGH (ref 150–440)
RBC: 5.49 MIL/uL — AB (ref 3.80–5.20)
RDW: 30.7 % — AB (ref 11.5–14.5)
WBC: 6 10*3/uL (ref 3.6–11.0)

## 2017-10-13 LAB — BRAIN NATRIURETIC PEPTIDE: B NATRIURETIC PEPTIDE 5: 78 pg/mL (ref 0.0–100.0)

## 2017-10-13 LAB — COMPREHENSIVE METABOLIC PANEL
ALBUMIN: 3.2 g/dL — AB (ref 3.5–5.0)
ALK PHOS: 87 U/L (ref 38–126)
ALT: 15 U/L (ref 14–54)
ANION GAP: 9 (ref 5–15)
AST: 27 U/L (ref 15–41)
BILIRUBIN TOTAL: 0.4 mg/dL (ref 0.3–1.2)
BUN: 11 mg/dL (ref 6–20)
CALCIUM: 9.1 mg/dL (ref 8.9–10.3)
CO2: 27 mmol/L (ref 22–32)
Chloride: 101 mmol/L (ref 101–111)
Creatinine, Ser: 0.61 mg/dL (ref 0.44–1.00)
GFR calc non Af Amer: >60 mL/min (ref >60)
GLUCOSE: 99 mg/dL (ref 65–99)
POTASSIUM: 4.3 mmol/L (ref 3.5–5.1)
SODIUM: 137 mmol/L (ref 135–145)
TOTAL PROTEIN: 8.3 g/dL — AB (ref 6.5–8.1)

## 2017-10-13 LAB — PROTIME-INR
INR: 1.62
INR: 1.72
PROTHROMBIN TIME: 19.1 s — AB (ref 11.4–15.2)
Prothrombin Time: 20 seconds — ABNORMAL HIGH (ref 11.4–15.2)

## 2017-10-13 LAB — LIPASE, BLOOD: Lipase: 15 U/L (ref 11–51)

## 2017-10-13 LAB — TROPONIN I

## 2017-10-13 MED ORDER — HYDROXYUREA 500 MG PO CAPS: 2000.0000 mg | ORAL_CAPSULE | ORAL | Status: DC

## 2017-10-13 MED ORDER — MORPHINE SULFATE (PF) 4 MG/ML IV SOLN
4.0000 mg | Freq: Once | INTRAVENOUS | Status: DC
  Filled 2017-10-13: qty 1

## 2017-10-13 MED ORDER — FENTANYL CITRATE (PF) 100 MCG/2ML IJ SOLN
25.0000 ug | Freq: Once | INTRAMUSCULAR | Status: AC
  Administered 2017-10-13: 25 ug via INTRAVENOUS

## 2017-10-13 MED ORDER — IOPAMIDOL (ISOVUE-370) INJECTION 76%
100.0000 mL | Freq: Once | INTRAVENOUS | Status: AC | PRN
  Administered 2017-10-13: 100 mL via INTRAVENOUS

## 2017-10-13 MED ORDER — IPRATROPIUM-ALBUTEROL 0.5-2.5 (3) MG/3ML IN SOLN: 3.0000 mL | RESPIRATORY_TRACT | Status: DC | PRN

## 2017-10-13 MED ORDER — HYDROXYUREA 500 MG PO CAPS
1500.0000 mg | ORAL_CAPSULE | ORAL | Status: DC
  Administered 2017-10-14 – 2017-10-16 (×3): 1500 mg via ORAL
  Filled 2017-10-13 (×3): qty 3

## 2017-10-13 MED ORDER — OXYCODONE HCL 5 MG PO TABS
5.0000 mg | ORAL_TABLET | ORAL | Status: DC | PRN
  Administered 2017-10-14: 5 mg via ORAL
  Filled 2017-10-13: qty 1

## 2017-10-13 MED ORDER — FUROSEMIDE 40 MG PO TABS
20.0000 mg | ORAL_TABLET | Freq: Two times a day (BID) | ORAL | Status: DC
  Administered 2017-10-14 – 2017-10-16 (×5): 20 mg via ORAL
  Filled 2017-10-13 (×5): qty 1

## 2017-10-13 MED ORDER — FENTANYL CITRATE (PF) 100 MCG/2ML IJ SOLN
INTRAMUSCULAR | Status: AC
  Filled 2017-10-13: qty 2

## 2017-10-13 MED ORDER — ONDANSETRON HCL 4 MG/2ML IJ SOLN
4.0000 mg | Freq: Once | INTRAMUSCULAR | Status: AC
  Administered 2017-10-13: 4 mg via INTRAVENOUS
  Filled 2017-10-13 (×2): qty 2

## 2017-10-13 MED ORDER — HYDROXYUREA 500 MG PO CAPS: 1500.0000 mg | ORAL_CAPSULE | Freq: Every day | ORAL | Status: DC

## 2017-10-13 MED ORDER — ONDANSETRON HCL 4 MG PO TABS: 4.0000 mg | ORAL_TABLET | Freq: Four times a day (QID) | ORAL | Status: DC | PRN

## 2017-10-13 MED ORDER — ASPIRIN EC 81 MG PO TBEC
81.0000 mg | DELAYED_RELEASE_TABLET | Freq: Every day | ORAL | Status: DC
Start: 2017-10-14 — End: 2017-10-16
  Administered 2017-10-14 – 2017-10-16 (×3): 81 mg via ORAL
  Filled 2017-10-13 (×3): qty 1

## 2017-10-13 MED ORDER — ACETAMINOPHEN 325 MG PO TABS
650.0000 mg | ORAL_TABLET | Freq: Four times a day (QID) | ORAL | Status: DC | PRN
  Administered 2017-10-14 – 2017-10-16 (×3): 650 mg via ORAL
  Filled 2017-10-13 (×3): qty 2

## 2017-10-13 MED ORDER — ONDANSETRON HCL 4 MG/2ML IJ SOLN: 4.0000 mg | Freq: Four times a day (QID) | INTRAMUSCULAR | Status: DC | PRN

## 2017-10-13 MED ORDER — ACETAMINOPHEN 650 MG RE SUPP: 650.0000 mg | Freq: Four times a day (QID) | RECTAL | Status: DC | PRN

## 2017-10-13 NOTE — ED Notes (Addendum)
PT's oxygen dropped to 86% and placed on 2L nasal canula. MD made aware.

## 2017-10-13 NOTE — H&P (Signed)
[facility] NAME: Yolanda Wells    MR#:  [id number]  DATE OF BIRTH:  [birth year]  DATE OF ADMISSION:  10/13/2017  PRIMARY CARE PHYSICIAN: [name], MD   REQUESTING/REFERRING PHYSICIAN: Clearnce Hasten, MD  CHIEF COMPLAINT:   Chief Complaint  Patient presents with  . Chest Pain    HISTORY OF PRESENT ILLNESS:  Yolanda Wells  is a 83 y.o. female who presents with an episode of chest pain accompanied by significant shortness of breath.  Patient states that she began to have central chest pain which then spread across both sides of her chest, and this was followed by significant shortness of breath.  When patient presented to the ED she was initially hypoxic, and O2 sats corrected on oxygen via nasal cannula.  Patient was recently admitted here for exacerbation of her heart failure.  She was felt to have pulmonary hypertension at that time as well.  CT scan today ruled out PE or pneumonia, but raise a question again for possible pulmonary hypertension.  Hospitalist were called for admission and further evaluation  PAST MEDICAL HISTORY:   Past Medical History:  Diagnosis Date  . Acute deep vein thrombosis (DVT) of distal vein of left lower extremity ([place]) 10/03/2015  . Acute pulmonary embolism ([place]) 10/03/2015  . Anticoagulant long-term use   . Arthritis   . CHF (congestive heart failure) ([place])   . Chronic anticoagulation 10/03/2015  . Collagen vascular disease ([place])   . Colostomy in place [facility]) 10/04/2015  . Deep venous thrombosis (H[facility])    right lower extremity  . Dependent edema   . Diverticulosis of intestine with bleeding 04/02/2015  . Glenohumeral arthritis 06/28/2012  . Hammertoe 09/02/2012  . History of bilateral hip replacements 09/02/2012  . History of hysterectomy   . Hypertension   . Hypokalemia   . Inability to ambulate due to hip 10/12/2015  . Mandibular dysfunction   . Onychomycosis 09/02/2012  .  Osteoarthritis   . Polycythemia vera ([place])   . Presence of IVC filter 05/25/2015  . Stroke ([place]) 03/26/2015   cerebellar  . Urinary incontinence in female 09/02/2016    PAST SURGICAL HISTORY:   Past Surgical History:  Procedure Laterality Date  . ABDOMINAL HYSTERECTOMY    . BLEPHAROPLASTY Right 03/2017   right upper eyelid  . CARDIAC CATHETERIZATION Right 04/03/2015   Procedure: CENTRAL LINE INSERTION;  Surgeon: Sherri Rad, MD;  Location: [facility] ORS;  Service: General;  Laterality: Right;  . COLECTOMY WITH COLOSTOMY CREATION/HARTMANN PROCEDURE N/A 04/03/2015   Procedure: COLECTOMY WITH COLOSTOMY CREATION/HARTMANN PROCEDURE;  Surgeon: Sherri Rad, MD;  Location: [facility] ORS;  Service: General;  Laterality: N/A;  . FOOT AMPUTATION     partial  . FOOT AMPUTATION Right   . JOINT REPLACEMENT     Left and Right Hip  . PERIPHERAL VASCULAR CATHETERIZATION N/A 04/02/2015   Procedure: Visceral Angiography;  Surgeon: Algernon Huxley, MD;  Location: [employer] CV LAB;  Service: Cardiovascular;  Laterality: N/A;  . PERIPHERAL VASCULAR CATHETERIZATION N/A 04/02/2015   Procedure: Visceral Artery Intervention;  Surgeon: Algernon Huxley, MD;  Location: [place] CV LAB;  Service: Cardiovascular;  Laterality: N/A;  . PERIPHERAL VASCULAR CATHETERIZATION N/A 05/25/2015   Procedure: IVC Filter Insertion;  Surgeon: Katha Cabal, MD;  Location: [place] CV LAB;  Service: Cardiovascular;  Laterality: N/A;  . TOE AMPUTATION     right  . TOTAL HIP ARTHROPLASTY      SOCIAL  HISTORY:   Social History   Tobacco Use  . Smoking status: Never Smoker  . Smokeless tobacco: Never Used  Substance Use Topics  . Alcohol use: No    FAMILY HISTORY:   Family History  Problem Relation Age of Onset  . Brain cancer Mother   . Heart attack Father   . COPD Father   . Coronary artery disease Father   . Hypertension Unknown   . Arthritis-Osteo Unknown   . Diabetes Brother   . Arthritis Brother   . Osteosarcoma  Son   . Bone cancer Son   . Arthritis Sister   . Arthritis Brother   . Hypertension Brother   . Coronary artery disease Brother     DRUG ALLERGIES:   Allergies  Allergen Reactions  . Sulfamethoxazole-Trimethoprim Nausea And Vomiting    MEDICATIONS AT HOME:   Prior to Admission medications   Medication Sig Start Date End Date Taking? Authorizing Provider  aspirin EC 81 MG tablet Take 1 tablet (81 mg total) by mouth daily. 09/16/15  Yes [name], MD  docusate sodium (COLACE) 100 MG capsule Take 100 mg by mouth 2 (two) times daily.   Yes [provider]  folic acid (FOLVITE) 462 MCG tablet Take 400 mcg by mouth daily.   Yes [provider]  furosemide (LASIX) 20 MG tablet Take 1 tablet (20 mg total) by mouth 2 (two) times daily. 10/09/17  Yes Demetrios Loll, MD  hydroxyurea (HYDREA) 500 MG capsule TAKE FOUR CAPSULES BY MOUTH DAILY Patient taking differently: Take 1,500-2,000 mg by mouth daily. 500mg  to 3 pills x 5 days week, and 4 pills x 2 days a week (total weekly dose of 23 pills). 09/14/17  Yes Corcoran, Drue Second, MD  potassium chloride (K-DUR,KLOR-CON) 10 MEQ tablet Take 10 mEq by mouth 2 (two) times a week. On Monday and Thursday  With lasix for potassium replacement   Yes [provider]  triamcinolone cream (KENALOG) 0.1 % Apply 1 application topically 2 (two) times daily. To affected area   Yes [provider]  warfarin (COUMADIN) 4 MG tablet Take 1 tablet (4 mg total) by mouth daily at 6 PM. Patient taking differently: Take 4 mg by mouth every Tuesday, Thursday, Saturday, and Sunday.  10/09/17  Yes Demetrios Loll, MD  warfarin (COUMADIN) 5 MG tablet Take 5 mg by mouth every Monday, Wednesday, and Friday.   Yes [provider]  amLODipine (NORVASC) 5 MG tablet Take 1 tablet (5 mg total) by mouth daily. Patient not taking: Reported on 10/06/2017 05/26/17 05/26/18  [name], MD  ENSURE (ENSURE) Take 1 Can by mouth 3 (three)  times daily with meals.    [provider]  polyethylene glycol (MIRALAX / GLYCOLAX) packet Take 17 g by mouth daily.    [provider]    REVIEW OF SYSTEMS:  Review of Systems  Constitutional: Negative for chills, fever, malaise/fatigue and weight loss.  HENT: Negative for ear pain, hearing loss and tinnitus.   Eyes: Negative for blurred vision, double vision, pain and redness.  Respiratory: Positive for shortness of breath. Negative for cough and hemoptysis.   Cardiovascular: Positive for chest pain. Negative for palpitations, orthopnea and leg swelling.  Gastrointestinal: Negative for abdominal pain, constipation, diarrhea, nausea and vomiting.  Genitourinary: Negative for dysuria, frequency and hematuria.  Musculoskeletal: Negative for back pain, joint pain and neck pain.  Skin:       No acne, rash, or lesions  Neurological: Negative for dizziness, tremors,  focal weakness and weakness.  Endo/Heme/Allergies: Negative for polydipsia. Does not bruise/bleed easily.  Psychiatric/Behavioral: Negative for depression. The patient is not nervous/anxious and does not have insomnia.      VITAL SIGNS:   Vitals:   10/13/17 1832 10/13/17 1837 10/13/17 1930 10/13/17 2030  BP:  (!) 173/94 135/82 137/74  Pulse:  89 85 93  Resp:  (!) 25 (!) 25 (!) 30  Temp:  98.1 F (36.7 C)    TempSrc:  Oral    SpO2:  95% 97% (!) 88%  Weight: 94.3 kg (208 lb)     Height: 5\' 7"  (1.702 m)      Wt Readings from Last 3 Encounters:  10/13/17 94.3 kg (208 lb)  10/09/17 94.4 kg (208 lb 1.6 oz)  08/13/17 90.9 kg (200 lb 7 oz)    PHYSICAL EXAMINATION:  Physical Exam  Vitals reviewed. Constitutional: She is oriented to person, place, and time. She appears well-developed and well-nourished. No distress.  HENT:  Head: Normocephalic and atraumatic.  Mouth/Throat: Oropharynx is clear and moist.  Eyes: Conjunctivae and EOM are normal. Pupils are equal, round, and reactive to light. No scleral  icterus.  Neck: Normal range of motion. Neck supple. No JVD present. No thyromegaly present.  Cardiovascular: Normal rate, regular rhythm and intact distal pulses. Exam reveals no gallop and no friction rub.  No murmur heard. Respiratory: Effort normal and breath sounds normal. No respiratory distress. She has no wheezes. She has no rales.  GI: Soft. Bowel sounds are normal. She exhibits no distension. There is no tenderness.  Musculoskeletal: Normal range of motion. She exhibits no edema.  No arthritis, no gout  Lymphadenopathy:    She has no cervical adenopathy.  Neurological: She is alert and oriented to person, place, and time. No cranial nerve deficit.  No dysarthria, no aphasia  Skin: Skin is warm and dry. No rash noted. No erythema.  Psychiatric: She has a normal mood and affect. Her behavior is normal. Judgment and thought content normal.    LABORATORY PANEL:   CBC Recent Labs  Lab 10/13/17 1846  WBC 6.0  HGB 14.5  HCT 46.2  PLT 620*   ------------------------------------------------------------------------------------------------------------------  Chemistries  Recent Labs  Lab 10/13/17 1846  NA 137  K 4.3  CL 101  CO2 27  GLUCOSE 99  BUN 11  CREATININE 0.61  CALCIUM 9.1  AST 27  ALT 15  ALKPHOS 87  BILITOT 0.4   ------------------------------------------------------------------------------------------------------------------  Cardiac Enzymes Recent Labs  Lab 10/13/17 1846  TROPONINI <0.03   ------------------------------------------------------------------------------------------------------------------  RADIOLOGY:  Dg Chest 2 View  Result Date: 10/13/2017 CLINICAL DATA:  Chest pain for several hours EXAM: CHEST  2 VIEW COMPARISON:  10/06/2017 FINDINGS: Cardiac shadow is enlarged but stable. Aortic calcifications are again seen. Elevation of the right hemidiaphragm is noted. The pulmonary artery is again prominent but stable. Lungs are well aerated  bilaterally. Minimal right basilar atelectasis is seen and stable. No acute bony abnormality is noted. Significant degenerative changes of the shoulder joints are noted. IMPRESSION: Mild right basilar atelectasis.  No acute abnormality is noted. Electronically Signed   By: Inez Catalina M.D.   On: 10/13/2017 19:13    EKG:   Orders placed or performed during the hospital encounter of 10/13/17  . ED EKG  . ED EKG    IMPRESSION AND PLAN:  Principal Problem:   Hypoxia -unclear etiology for her hypoxia at this time.  She does not seem to be an exacerbation of her  COPD, she did have accompanying chest pain with her shortness of breath, though her initial cardiac enzyme here was negative and her BNP was within normal limits.  Chest imaging did not show any significant abnormality except for suggestion of pulmonary hypertension and an aortic aneurysm that needs to be followed with subsequent imaging.  Continue supplemental oxygen via nasal cannula, get a cardiology consult Active Problems:   Chest pain -cycle cardiac enzymes, get a cardiology consult.   HTN (hypertension) -home meds   COPD (chronic obstructive pulmonary disease) (HCC) -home dose inhalers   Chronic diastolic CHF (congestive heart failure) ([place]) -home medications, other workup as above  All the records are reviewed and case discussed with ED provider. Management plans discussed with the patient and/or family.  DVT PROPHYLAXIS: Systemic anticoagulation  GI PROPHYLAXIS: None  ADMISSION STATUS: Inpatient  CODE STATUS: DNR Code Status History    Date Active Date Inactive Code Status Order ID Comments User Context   10/06/2017 14:54 10/09/2017 20:58 DNR [id number]  Nicholes Mango, MD Inpatient   10/06/2017 11:11 10/06/2017 14:53 Full Code [id number]  Nicholes Mango, MD Inpatient   05/27/2017 19:54 05/28/2017 20:13 Full Code [id number]  Vaughan Basta, MD Inpatient   05/23/2017 23:22 05/26/2017 16:14 Full Code [id number]  Lance Coon,  MD Inpatient   04/10/2017 03:25 04/11/2017 19:16 Full Code [id number]  Harrie Foreman, MD Inpatient   11/23/2016 06:29 12/01/2016 19:35 Full Code [id number]  Harrie Foreman, MD Inpatient   08/26/2016 04:45 08/26/2016 19:23 Full Code [id number]  Saundra Shelling, MD Inpatient   09/22/2015 17:55 09/25/2015 18:44 Full Code [id number]  Demetrios Loll, MD ED   09/15/2015 03:41 09/17/2015 17:41 Full Code [id number]  Harrie Foreman, MD Inpatient   07/16/2015 01:51 07/20/2015 23:34 Full Code [id number]  Lance Coon, MD Inpatient   05/25/2015 08:34 05/29/2015 16:16 Full Code [id number]  Harrie Foreman, MD Inpatient   04/02/2015 06:06 04/08/2015 16:51 Full Code [id number]  Harrie Foreman, MD Inpatient    Questions for Most Recent Historical Code Status (Order [id number])    Question Answer Comment   In the event of cardiac or respiratory ARREST Do not call a "code blue"    In the event of cardiac or respiratory ARREST Do not perform Intubation, CPR, defibrillation or ACLS    In the event of cardiac or respiratory ARREST Use medication by any route, position, wound care, and other measures to relive pain and suffering. May use oxygen, suction and manual treatment of airway obstruction as needed for comfort.    Comments RN may pronounce         Advance Directive Documentation     Most Recent Value  Type of Advance Directive  Healthcare Power of Attorney, Living will  Pre-existing out of facility DNR order (yellow form or pink MOST form)  No data  "MOST" Form in Place?  No data      TOTAL TIME TAKING CARE OF THIS PATIENT: 45 minutes.   Carletha Dawn Marshall 10/13/2017, 9:44 PM  [facility]  Office  [phone]  CC: Primary care physician; [name], MD  Note:  This document was prepared using Dragon voice recognition software and may include unintentional dictation errors.

## 2017-10-13 NOTE — ED Triage Notes (Signed)
Patient from home via [facility]. Complaining of chest pain since this morning. Per patient and family, patient is short of breath at baseline with fluid in right lower lobe. Patient reports she was seen by cardiologist yesterday and is scheduled for echo next week. Patient denies N/V. Alert and oriented x4.

## 2017-10-13 NOTE — ED Provider Notes (Signed)
[facility] Emergency Department Provider Note  ____________________________________________   First MD Initiated Contact with Patient 10/13/17 1842     (approximate)  I have reviewed the triage vital signs and the nursing notes.   HISTORY  Chief Complaint Chest Pain   HPI Yolanda Wells is a 83 y.o. female with a history of DVT on Coumadin as well as CHF and COPD who is presenting to the emergency department today with shortness of breath and chest pain.  Says that the shortness of breath and chest pain both started this morning.  She says that the chest pain is to the center of her chest and to the lower end of the sternum.  Says it feels like someone is "dancing on her chest."  She says the pain is an 8 out of 10 and nonradiating.  Says that she also feels short of breath.  EMS said that the patient was 91% on room air when they arrived but came was 97% with 3 L of nasal cannula oxygen.  She was not given any further medications in route.  Patient does not report any exacerbating or alleviating factors.    Past Medical History:  Diagnosis Date  . Acute deep vein thrombosis (DVT) of distal vein of left lower extremity ([place]) 10/03/2015  . Acute pulmonary embolism ([place]) 10/03/2015  . Anticoagulant long-term use   . Arthritis   . Chronic anticoagulation 10/03/2015  . Collagen vascular disease ([place])   . Colostomy in place [facility]) 10/04/2015  . Deep venous thrombosis (H[facility])    right lower extremity  . Dependent edema   . Diverticulosis of intestine with bleeding 04/02/2015  . Glenohumeral arthritis 06/28/2012  . Hammertoe 09/02/2012  . History of bilateral hip replacements 09/02/2012  . History of hysterectomy   . Hypertension   . Hypokalemia   . Inability to ambulate due to hip 10/12/2015  . Mandibular dysfunction   . Onychomycosis 09/02/2012  . Osteoarthritis   . Polycythemia vera ([place])   . Presence of IVC filter 05/25/2015  . 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 Urinary incontinence in female 09/02/2016    Patient Active Problem List   Diagnosis Date Noted  . Acute CHF (congestive heart failure) ([place]) 10/06/2017  . CHF (congestive heart failure) ([place]) 07/28/2017  . Dyspnea 05/27/2017  . Ileus ([place]) 04/10/2017  . Influenza with respiratory manifestation 12/01/2016  . Respiratory distress 11/23/2016  . Urinary incontinence in female 09/02/2016  . Vision loss of right eye 08/26/2016  . Blindness of right eye 08/26/2016  . Inability to ambulate due to hip 10/12/2015  . Colostomy in place [facility]) 10/04/2015  . Chronic anticoagulation 10/03/2015  . Acute pulmonary embolism ([place]) 10/03/2015  . Leukocytosis 09/25/2015  . Chest pain 09/25/2015  . COPD exacerbation ([place]) 09/24/2015  . CAP (community acquired pneumonia) 09/24/2015  . COPD (chronic obstructive pulmonary disease) ([place]) 09/24/2015  . SOB (shortness of breath) 09/22/2015  . Elevated troponin 09/15/2015  . UTI (urinary tract infection) 07/16/2015  . Abdominal wall cellulitis 07/15/2015  . DVT (deep venous thrombosis) ([place]) 07/15/2015  . HTN (hypertension) 07/15/2015  . Polycythemia vera ([place]) 07/15/2015  . Dependent edema 07/15/2015  . Arthritis 07/15/2015  . Pulmonary emboli ([place]) 05/25/2015  . Diverticulosis of colon with hemorrhage 04/02/2015  . Diverticulosis of intestine with bleeding 04/02/2015  . Dehydration, moderate 01/11/2015  . Fall 01/10/2015  . Osteoarthritis 01/10/2015  . Hammertoe 09/02/2012  . S/P transmetatarsal amputation of foot ([place]) 09/02/2012  .  Onychomycosis 09/02/2012  . Other specified dermatoses 09/02/2012  . Hip pain 08/23/2012  . S/P hip replacement 08/23/2012  . Glenohumeral arthritis 06/28/2012    Past Surgical History:  Procedure Laterality Date  . ABDOMINAL HYSTERECTOMY    . BLEPHAROPLASTY Right 03/2017   right upper eyelid  . CARDIAC CATHETERIZATION Right 04/03/2015   Procedure: CENTRAL LINE INSERTION;  Surgeon:  Sherri Rad, MD;  Location: [facility] ORS;  Service: General;  Laterality: Right;  . COLECTOMY WITH COLOSTOMY CREATION/HARTMANN PROCEDURE N/A 04/03/2015   Procedure: COLECTOMY WITH COLOSTOMY CREATION/HARTMANN PROCEDURE;  Surgeon: Sherri Rad, MD;  Location: [facility] ORS;  Service: General;  Laterality: N/A;  . FOOT AMPUTATION     partial  . FOOT AMPUTATION Right   . JOINT REPLACEMENT     Left and Right Hip  . PERIPHERAL VASCULAR CATHETERIZATION N/A 04/02/2015   Procedure: Visceral Angiography;  Surgeon: Algernon Huxley, MD;  Location: [place] CV LAB;  Service: Cardiovascular;  Laterality: N/A;  . PERIPHERAL VASCULAR CATHETERIZATION N/A 04/02/2015   Procedure: Visceral Artery Intervention;  Surgeon: Algernon Huxley, MD;  Location: [place] CV LAB;  Service: Cardiovascular;  Laterality: N/A;  . PERIPHERAL VASCULAR CATHETERIZATION N/A 05/25/2015   Procedure: IVC Filter Insertion;  Surgeon: Katha Cabal, MD;  Location: [place] CV LAB;  Service: Cardiovascular;  Laterality: N/A;  . TOE AMPUTATION     right  . TOTAL HIP ARTHROPLASTY      Prior to Admission medications   Medication Sig Start Date End Date Taking? Authorizing Provider  amLODipine (NORVASC) 5 MG tablet Take 1 tablet (5 mg total) by mouth daily. Patient not taking: Reported on 10/06/2017 05/26/17 05/26/18  [name], MD  aspirin EC 81 MG tablet Take 1 tablet (81 mg total) by mouth daily. 09/16/15   [name], MD  docusate sodium (COLACE) 100 MG capsule Take 100 mg by mouth 2 (two) times daily.    [provider]  ENSURE (ENSURE) Take 1 Can by mouth 3 (three) times daily with meals.    [provider]  folic acid (FOLVITE) 412 MCG tablet Take 400 mcg by mouth daily.    [provider]  furosemide (LASIX) 20 MG tablet Take 1 tablet (20 mg total) by mouth 2 (two) times daily. 10/09/17   Demetrios Loll, MD  hydroxyurea (HYDREA) 500 MG capsule TAKE FOUR CAPSULES BY MOUTH DAILY Patient not taking:  Reported on 10/12/2017 09/14/17   Lequita Asal, MD  polyethylene glycol (MIRALAX / GLYCOLAX) packet Take 17 g by mouth daily.    [provider]  potassium chloride (K-DUR,KLOR-CON) 10 MEQ tablet Take 10 mEq by mouth 2 (two) times a week. On Monday and Thursday  With lasix for potassium replacement    [provider]  triamcinolone cream (KENALOG) 0.1 % Apply 1 application topically 2 (two) times daily. To affected area    [provider]  warfarin (COUMADIN) 4 MG tablet Take 1 tablet (4 mg total) by mouth daily at 6 PM. 10/09/17   Demetrios Loll, MD    Allergies Sulfamethoxazole-trimethoprim  Family History  Problem Relation Age of Onset  . Brain cancer Mother   . Heart attack Father   . COPD Father   . Coronary artery disease Father   . Hypertension Unknown   . Arthritis-Osteo Unknown   . Diabetes Brother   . Arthritis Brother   . Osteosarcoma Son   . Bone cancer Son   . Arthritis Sister   . Arthritis Brother   .  Hypertension Brother   . Coronary artery disease Brother     Social History Social History   Tobacco Use  . Smoking status: Never Smoker  . Smokeless tobacco: Never Used  Substance Use Topics  . Alcohol use: No  . Drug use: No    Review of Systems  Constitutional: No fever/chills Eyes: No visual changes. ENT: No sore throat. Cardiovascular: As above Respiratory: As above Gastrointestinal: No abdominal pain.  No nausea, no vomiting.  No diarrhea.  No constipation. Genitourinary: Negative for dysuria. Musculoskeletal: Negative for back pain. Skin: Negative for rash. Neurological: Negative for headaches, focal weakness or numbness.   ____________________________________________   PHYSICAL EXAM:  VITAL SIGNS: ED Triage Vitals  Enc Vitals Group     BP 10/13/17 1837 (!) 173/94     Pulse Rate 10/13/17 1837 89     Resp 10/13/17 1837 (!) 25     Temp 10/13/17 1837 98.1 F (36.7 C)     Temp Source 10/13/17 1837 Oral      SpO2 10/13/17 1837 95 %     Weight 10/13/17 1832 208 lb (94.3 kg)     Height 10/13/17 1832 5\' 7"  (1.702 m)     Head Circumference --      Peak Flow --      Pain Score 10/13/17 1831 8     Pain Loc --      Pain Edu? --      Excl. in [place]? --     Constitutional: Alert and oriented. in no acute distress. Eyes: Conjunctivae are normal.  Head: Atraumatic. Nose: No congestion/rhinnorhea. Mouth/Throat: Mucous membranes are moist.  Neck: No stridor.   Cardiovascular: Normal rate, regular rhythm. Grossly normal heart sounds.  Respiratory: Tachypneic with rales to the bilateral lower fields as well as the mid field on the right. Gastrointestinal: Soft and nontender. No distention.  Colostomy with brown stool into the left lower quadrant.  No skin breakdown surrounding the colostomy bag. Musculoskeletal: Mild edema to the bilateral lower extremities..  No joint effusions. Neurologic:  Normal speech and language. No gross focal neurologic deficits are appreciated. Skin:  Skin is warm, dry and intact. No rash noted. Psychiatric: Mood and affect are normal. Speech and behavior are normal.  ____________________________________________   LABS (all labs ordered are listed, but only abnormal results are displayed)  Labs Reviewed  CBC WITH DIFFERENTIAL/PLATELET - Abnormal; Notable for the following components:      Result Value   RBC 5.49 (*)    MCHC 31.4 (*)    RDW 30.7 (*)    Platelets 620 (*)    All other components within normal limits  COMPREHENSIVE METABOLIC PANEL - Abnormal; Notable for the following components:   Total Protein 8.3 (*)    Albumin 3.2 (*)    All other components within normal limits  PROTIME-INR - Abnormal; Notable for the following components:   Prothrombin Time 19.1 (*)    All other components within normal limits  LIPASE, BLOOD  TROPONIN I  BRAIN NATRIURETIC PEPTIDE   ____________________________________________  EKG  ED ECG REPORT I, Doran Stabler, the  attending physician, personally viewed and interpreted this ECG.   Date: 10/13/2017  EKG Time: 1851  Rate: 89  Rhythm: normal sinus rhythm  Axis: Normal  Intervals:right bundle branch block  ST&T Change: No ST segment elevation or depression.  No abnormal T wave inversion. No significant change from previous.  Right bundle branch block is old. ____________________________________________  RADIOLOGY  Mild right basilar  atelectasis without any other acute abnormality noted. ____________________________________________   PROCEDURES  Procedure(s) performed:   Procedures  Critical Care performed:   ____________________________________________   INITIAL IMPRESSION / ASSESSMENT AND PLAN / ED COURSE  Pertinent labs & imaging results that were available during my care of the patient were reviewed by me and considered in my medical decision making (see chart for details).  Differential diagnosis includes, but is not limited to, ACS, aortic dissection, pulmonary embolism, cardiac tamponade, pneumothorax, pneumonia, pericarditis, myocarditis, GI-related causes including esophagitis/gastritis, and musculoskeletal chest wall pain.   Differential includes, but is not limited to, viral syndrome, bronchitis including COPD exacerbation, pneumonia, reactive airway disease including asthma, CHF including exacerbation with or without pulmonary/interstitial edema, pneumothorax, ACS, thoracic trauma, and pulmonary embolism.  As part of my medical decision making, I reviewed the following data within the electronic MEDICAL RECORD NUMBER Notes from prior ED visits   ----------------------------------------- 8:58 PM on 10/13/2017 -----------------------------------------  Patient continues to be chest pain-free after fentanyl.  However, she desaturated to 86% on room air.  Also with decreased respiratory distress.  However, given her pain as well as hypoxia she will be scanned for PE.  She is  subtherapeutic on her INR.  If the PET scan is negative she will be given Lasix.  She will need to be admitted regardless for hypoxia.  Discussed case with Dr. Jannifer Franklin of the hospitalist service.  The patient as well as granddaughter understanding of the need for admission willing to comply.     ____________________________________________   FINAL CLINICAL IMPRESSION(S) / ED DIAGNOSES  Hypoxia.  Chest pain.  Shortness of breath.    NEW MEDICATIONS STARTED DURING THIS VISIT:  This SmartLink is deprecated. Use AVSMEDLIST instead to display the medication list for a patient.   Note:  This document was prepared using Dragon voice recognition software and may include unintentional dictation errors.     Orbie Pyo, MD 10/13/17 2059

## 2017-10-14 ENCOUNTER — Encounter: Payer: Self-pay | Admitting: Internal Medicine

## 2017-10-14 LAB — BASIC METABOLIC PANEL
ANION GAP: 11 (ref 5–15)
BUN: 9 mg/dL (ref 6–20)
CALCIUM: 9.1 mg/dL (ref 8.9–10.3)
CO2: 25 mmol/L (ref 22–32)
Chloride: 100 mmol/L — ABNORMAL LOW (ref 101–111)
Creatinine, Ser: 0.57 mg/dL (ref 0.44–1.00)
GLUCOSE: 92 mg/dL (ref 65–99)
POTASSIUM: 4.2 mmol/L (ref 3.5–5.1)
Sodium: 136 mmol/L (ref 135–145)

## 2017-10-14 LAB — CBC
HEMATOCRIT: 45.5 % (ref 35.0–47.0)
HEMOGLOBIN: 14.3 g/dL (ref 12.0–16.0)
MCH: 26.4 pg (ref 26.0–34.0)
MCHC: 31.4 g/dL — ABNORMAL LOW (ref 32.0–36.0)
MCV: 84 fL (ref 80.0–100.0)
Platelets: 595 10*3/uL — ABNORMAL HIGH (ref 150–440)
RBC: 5.42 MIL/uL — AB (ref 3.80–5.20)
RDW: 30.8 % — ABNORMAL HIGH (ref 11.5–14.5)
WBC: 6.5 10*3/uL (ref 3.6–11.0)

## 2017-10-14 LAB — TROPONIN I
TROPONIN I: 0.03 ng/mL — AB (ref <0.03)
TROPONIN I: 0.03 ng/mL — AB (ref <0.03)
Troponin I: 0.03 ng/mL (ref <0.03)

## 2017-10-14 LAB — PROTIME-INR
INR: 1.67
PROTHROMBIN TIME: 19.6 s — AB (ref 11.4–15.2)

## 2017-10-14 MED ORDER — WARFARIN SODIUM 6 MG PO TABS
6.0000 mg | ORAL_TABLET | Freq: Once | ORAL | Status: AC
  Administered 2017-10-14: 6 mg via ORAL
  Filled 2017-10-14: qty 1

## 2017-10-14 MED ORDER — WARFARIN SODIUM 4 MG PO TABS
4.0000 mg | ORAL_TABLET | ORAL | Status: DC
  Administered 2017-10-15: 4 mg via ORAL
  Filled 2017-10-14: qty 1

## 2017-10-14 MED ORDER — WARFARIN - PHARMACIST DOSING INPATIENT: Freq: Every day | Status: DC

## 2017-10-14 MED ORDER — WARFARIN SODIUM 5 MG PO TABS: 5.0000 mg | ORAL_TABLET | ORAL | Status: DC

## 2017-10-14 MED ORDER — WARFARIN SODIUM 5 MG PO TABS
5.0000 mg | ORAL_TABLET | ORAL | Status: DC
  Filled 2017-10-14: qty 1

## 2017-10-14 MED ORDER — ORAL CARE MOUTH RINSE
15.0000 mL | Freq: Two times a day (BID) | OROMUCOSAL | Status: DC
  Administered 2017-10-14 (×2): 15 mL via OROMUCOSAL

## 2017-10-14 NOTE — Progress Notes (Signed)
Notified Dr Jannifer Franklin of critical troponin 0.03. No order received

## 2017-10-14 NOTE — Progress Notes (Signed)
Yolanda Wells at [place] [facility] NAME: Yolanda Wells    MR#:  [id number]  DATE OF BIRTH:  Jul 30, 1932  SUBJECTIVE:  CHIEF COMPLAINT:   Chief Complaint  Patient presents with  . Chest Pain   Doesn't complain of any CP or SOB today.  Confused  REVIEW OF SYSTEMS:    Review of Systems  Unable to perform ROS: Dementia    DRUG ALLERGIES:   Allergies  Allergen Reactions  . Sulfamethoxazole-Trimethoprim Nausea And Vomiting    VITALS:  Blood pressure (!) 155/79, pulse 68, temperature 98.2 F (36.8 C), temperature source Oral, resp. rate 16, height 5\' 7"  (1.702 m), weight 103.7 kg (228 lb 9.6 oz), SpO2 98 %.  PHYSICAL EXAMINATION:   Physical Exam  GENERAL:  83 y.o.-year-old patient lying in the bed with no acute distress.  EYES: Pupils equal, round, reactive to light and accommodation. No scleral icterus. Extraocular muscles intact.  HEENT: Head atraumatic, normocephalic. Oropharynx and nasopharynx clear.  NECK:  Supple, no jugular venous distention. No thyroid enlargement, no tenderness.  LUNGS: Normal breath sounds bilaterally, no wheezing, rales, rhonchi. No use of accessory muscles of respiration.  CARDIOVASCULAR: S1, S2 normal. No murmurs, rubs, or gallops.  ABDOMEN: Soft, nontender, nondistended. Bowel sounds present. No organomegaly or mass.  EXTREMITIES: No cyanosis, clubbing or edema b/l.    NEUROLOGIC: Cranial nerves II through XII are intact. No focal Motor or sensory deficits b/l.   PSYCHIATRIC: The patient is alert . Not oriented   LABORATORY PANEL:   CBC Recent Labs  Lab 10/14/17 0535  WBC 6.5  HGB 14.3  HCT 45.5  PLT 595*   ------------------------------------------------------------------------------------------------------------------ Chemistries  Recent Labs  Lab 10/13/17 1846 10/14/17 0535  NA 137 136  K 4.3 4.2  CL 101 100*  CO2 27 25  GLUCOSE 99 92  BUN 11 9  CREATININE 0.61 0.57  CALCIUM 9.1 9.1  AST 27   --   ALT 15  --   ALKPHOS 87  --   BILITOT 0.4  --    ------------------------------------------------------------------------------------------------------------------  Cardiac Enzymes Recent Labs  Lab 10/14/17 1139  TROPONINI 0.03*   ------------------------------------------------------------------------------------------------------------------  RADIOLOGY:  Dg Chest 2 View  Result Date: 10/13/2017 CLINICAL DATA:  Chest pain for several hours EXAM: CHEST  2 VIEW COMPARISON:  10/06/2017 FINDINGS: Cardiac shadow is enlarged but stable. Aortic calcifications are again seen. Elevation of the right hemidiaphragm is noted. The pulmonary artery is again prominent but stable. Lungs are well aerated bilaterally. Minimal right basilar atelectasis is seen and stable. No acute bony abnormality is noted. Significant degenerative changes of the shoulder joints are noted. IMPRESSION: Mild right basilar atelectasis.  No acute abnormality is noted. Electronically Signed   By: Inez Catalina M.D.   On: 10/13/2017 19:13   Ct Angio Chest Pe W And/or Wo Contrast  Result Date: 10/13/2017 CLINICAL DATA:  Chest pain since this morning, shortness of breath at baseline, question pulmonary embolism with high suspected pretest probability EXAM: CT ANGIOGRAPHY CHEST WITH CONTRAST TECHNIQUE: Multidetector CT imaging of the chest was performed using the standard protocol during bolus administration of intravenous contrast. Multiplanar CT image reconstructions and MIPs were obtained to evaluate the vascular anatomy. CONTRAST:  170mL ISOVUE-370 IOPAMIDOL (ISOVUE-370) INJECTION 76% COMPARISON:  11/23/2016 FINDINGS: Cardiovascular: Atherosclerotic calcifications thoracic aorta with aneurysmal dilatation of the ascending thoracic aorta 4.1 cm. Dilatation of the central pulmonary arteries, 4.7 cm transverse main pulmonary artery, 3.3 cm RIGHT pulmonary artery and 3.0 cm LEFT  pulmonary artery. Pulmonary arteries adequately  opacified and grossly patent. No definite evidence of pulmonary embolism. Mitral annular calcification. Mediastinum/Nodes: Nodules in both thyroid lobes including a partially calcified exophytic nodule at inferior pole of RIGHT lobe 2.5 cm greatest size unchanged. Esophagus unremarkable. Question tiny hiatal hernia. No thoracic adenopathy. Lungs/Pleura: Significant atelectasis of RIGHT lower lobe. Elevation of RIGHT diaphragm. Subsegmental atelectasis LEFT lower lobe. No acute infiltrate, pleural effusion, or pneumothorax. Upper Abdomen: Partial visualization of a large cyst upper pole RIGHT kidney 4.5 x 4.1 cm image 93. Remaining visualized upper abdomen unremarkable. Musculoskeletal: Osseous demineralization with degenerative changes of the thoracic spine. Review of the MIP images confirms the above findings. IMPRESSION: No evidence of pulmonary embolism. Marked dilatation of the central pulmonary arteries question pulmonary artery hypertension. RIGHT basilar atelectasis.  Line multinodular thyroid gland. Aneurysmal dilatation ascending thoracic aorta 4.1 cm greatest diameter, recommendation below. Recommend annual imaging followup by CTA or MRA. This recommendation follows 2010 ACCF/AHA/AATS/ACR/ASA/SCA/SCAI/SIR/STS/SVM Guidelines for the Diagnosis and Management of Patients with Thoracic Aortic Disease. Circulation. 2010; 121: F749-S496 Aortic Atherosclerosis (ICD10-I70.0). Aortic aneurysm NOS (ICD10-I71.9). Electronically Signed   By: Lavonia Dana M.D.   On: 10/13/2017 22:04     ASSESSMENT AND PLAN:   * Acute hypoxic resp failure likely due to chronic lung disease Chronic resp failure? Consult pulmonary Will need OP sleep study and PFTs Called Dr. Raul Del but unable to get thru  * Chest pain Seen by cardiology Discussed with Dr. Humphrey Rolls [facility] on Friday as OP  PT eval  Will need home O2  D/c home tomorrow  All the records are reviewed and case discussed with Care Management/Social  Worker Management plans discussed with the patient, family and they are in agreement.  CODE STATUS: DNR  DVT Prophylaxis: ON coumadin  TOTAL TIME TAKING CARE OF THIS PATIENT: 30 minutes.   POSSIBLE D/C IN 1-2 DAYS, DEPENDING ON CLINICAL CONDITION.  Yolanda Alf Aaliyah Cancro M.D on 10/14/2017 at 6:49 PM  Between 7am to 6pm - Pager - [phone]  After 6pm go to [email or web address] - password [email or web address] [facility]  Office  [phone]  CC: Primary care physician; [name], MD  Note: This dictation was prepared with Dragon dictation along with smaller phrase technology. Any transcriptional errors that result from this process are unintentional.

## 2017-10-14 NOTE — Progress Notes (Signed)
ANTICOAGULATION CONSULT NOTE - Follow Up Consult  Pharmacy Consult for Warfarin Indication: VTE prophylaxis  Allergies  Allergen Reactions  . Sulfamethoxazole-Trimethoprim Nausea And Vomiting    Patient Measurements: Height: 5\' 7"  (170.2 cm) Weight: 228 lb 9.6 oz (103.7 kg) IBW/kg (Calculated) : 61.6 Heparin Dosing Weight: 82.2 kg  Vital Signs: Temp: 98.2 F (36.8 C) (12/05 0727) Temp Source: Oral (12/05 0727) BP: 155/79 (12/05 0727) Pulse Rate: 88 (12/05 0727)  Labs: Recent Labs    10/12/17 1450 10/13/17 1846 10/13/17 2326 10/14/17 0119 10/14/17 0535  HGB 13.5 14.5  --   --  14.3  HCT 42.4 46.2  --   --  45.5  PLT 566* 620*  --   --  595*  LABPROT 18.2* 19.1* 20.0*  --  19.6*  INR 1.52 1.62 1.72  --  1.67  CREATININE  --  0.61  --   --  0.57  TROPONINI  --  <0.03  --  0.03* 0.03*    Estimated Creatinine Clearance: 65.9 mL/min (by C-G formula based on SCr of 0.57 mg/dL).   Medications:  Scheduled:  . aspirin EC  81 mg Oral Daily  . furosemide  20 mg Oral BID  . hydroxyurea  1,500 mg Oral Once per day on Mon Tue Wed Thu Fri  . [START ON 10/17/2017] hydroxyurea  2,000 mg Oral Once per day on Sun Sat  . mouth rinse  15 mL Mouth Rinse BID  . [START ON 10/15/2017] warfarin  4 mg Oral Once per day on Sun Tue Thu Sat  . [START ON 10/16/2017] warfarin  5 mg Oral Q M,W,F-1800  . warfarin  6 mg Oral ONCE-1800  . Warfarin - Pharmacist Dosing Inpatient   Does not apply q1800    Assessment: Patient admitted for CP w/ h/o CHF and possible pulmonary hypertension on CT and negative for PE. Patient is anticoagulated with warfarin PTA for DVT on 11/23. Patient takes: Warfarin 5 mg on Mon-Wed-Fri (dose was recently increased at cardio office d/t suptherapeutic INR of 1.5) Warfarin 4 mg Tues-Thur-Sat-Sun  12/3 @ 1450 INR 1.52 12/4 @ 1846 INR 1.62 12/4 @ 2326 INR 1.72  Warfarin 4 mg (last dose taken PTA) 12/5  INR  1.67   Goal of Therapy:  INR 2-3 Monitor platelets by  anticoagulation protocol: Yes   Plan:   Will order Warfarin 6 mg x 1 today Will then continue home warfarin regimen as above considering dose has just been increased per office visit note 10/12/17. Will monitor daily INR and every 3 day CBC. Will adjust per INR trends.  Chinita Greenland PharmD Clinical Pharmacist 10/14/2017

## 2017-10-14 NOTE — Evaluation (Addendum)
Physical Therapy Evaluation Patient Details Name: Yolanda Wells MRN: [id number] DOB: 10/01/1932 Today's Date: 10/14/2017   History of Present Illness  Pt is an 83 y.o. female presenting to hospital with SOB, LE swelling, recent h/o UTI, and recent 7# weight gain.  Pt admitted with acute respiratory failure (possibly from new onset CHF/pulmonary htn), and acute cystitis with abnormal urinalyisis.  PMH includes blindness R eye, colostomy, PE, COPD, CHF, DVT, B hip replacements, stroke, R TMA.  Clinical Impression  Pt is an 83 year old female who lives in a 1 story home with her daughter who is her caretaker.  Pt has been nonambulatory and daughter reports that she helps her into her WC daily, along with giving her a bed bath.  Pt's daughter states that the pt is also incontinent.  She presented with overall weakness of UE and LE.  Pt required mod-max assistance with bed mobility, STS transfer and was unable to complete a standing pivot transfer, which pt's daughter reports is unusual.  Pt made three attempts to transfer and stated that she was too fatigued.  Pt required assistance to perform sit to supine.  Pt will continue to benefit from skilled PT with focus on strength, tolerance to activity and functional mobility.    Follow Up Recommendations SNF    Equipment Recommendations       Recommendations for Other Services       Precautions / Restrictions Precautions Precautions: Fall Restrictions Weight Bearing Restrictions: No      Mobility  Bed Mobility Overal bed mobility: Needs Assistance Bed Mobility: Rolling;Sidelying to Sit;Supine to Sit;Sit to Supine Rolling: Mod assist Sidelying to sit: Mod assist Supine to sit: Mod assist Sit to supine: Max assist   General bed mobility comments: Pt required hand held assistance to perform supine to sit and max assist for sit to supine when bringing LE's over EOB.  Pt repeatedly experienced LOB's in the posterior direction.  Transfers    Equipment used: Rolling walker (2 wheeled);2 person hand held assist   Sit to Stand: Max assist Stand pivot transfers: Max assist;+2 physical assistance       General transfer comment: Three attempted stand pivot transfers with pt's daughter demonstrating how she normally transfers pt at home.  Pt experienced LOB's each time and appeared too weak to complete transfer.  Pt's daughter states that this is not baseline for the pt.  Ambulation/Gait Ambulation/Gait assistance: (Unable to perform.)              Stairs            Wheelchair Mobility    Modified Rankin (Stroke Patients Only)       Balance Overall balance assessment: History of Falls Sitting-balance support: Bilateral upper extremity supported   Sitting balance - Comments: Requires support from PT to avoid posterior LOB.   Standing balance support: Bilateral upper extremity supported   Standing balance comment: Unable to remain upright when standing with RW.                             Pertinent Vitals/Pain      Home Living Family/patient expects to be discharged to:: Private residence Living Arrangements: Children Available Help at Discharge: Family Type of Home: [place]: [place]: Environmental consultant - 2 wheels;Wheelchair - manual      Prior Function Level of Independence: Needs assistance  Hand Dominance        Extremity/Trunk Assessment   Upper Extremity Assessment Upper Extremity Assessment: Generalized weakness    Lower Extremity Assessment Lower Extremity Assessment: Generalized weakness    Cervical / Trunk Assessment Cervical / Trunk Assessment: Kyphotic  Communication      Cognition Arousal/Alertness: Lethargic Behavior During Therapy: Restless Overall Cognitive Status: Impaired/Different from baseline Area of Impairment: Attention;Following commands                   Current  Attention Level: Selective   Following Commands: Follows one step commands inconsistently       General Comments: Pt vascillated between being able to receive commands and then appearing unaware that the PT had spoken to her.  Pt's daughter stated that the pt's overall performance and appearance is not close to baseline.      General Comments      Exercises     Assessment/Plan    PT Assessment Patient needs continued PT services  PT Problem List Decreased strength;Decreased range of motion;Decreased activity tolerance;Decreased balance;Decreased mobility;Decreased knowledge of use of DME       PT Treatment Interventions DME instruction;Gait training;Functional mobility training;Therapeutic activities;Therapeutic exercise;Balance training;Patient/family education    PT Goals (Current goals can be found in the Care Plan section)  Acute Rehab PT Goals Patient Stated Goal: To eventually return home when back to baseline. PT Goal Formulation: With patient/family Time For Goal Achievement: 10/28/17 Potential to Achieve Goals: Fair    Frequency Min 2X/week   Barriers to discharge        Co-evaluation               AM-PAC PT "6 Clicks" Daily Activity  Outcome Measure Difficulty turning over in bed (including adjusting bedclothes, sheets and blankets)?: A Lot Difficulty moving from lying on back to sitting on the side of the bed? : A Lot Difficulty sitting down on and standing up from a chair with arms (e.g., wheelchair, bedside commode, etc,.)?: A Lot Help needed moving to and from a bed to chair (including a wheelchair)?: A Lot Help needed walking in hospital room?: A Lot Help needed climbing 3-5 steps with a railing? : Total 6 Click Score: 11    End of Session Equipment Utilized During Treatment: Gait belt Activity Tolerance: Treatment limited secondary to medical complications (Comment)(Pt was reportedly unable to perform bed mobility and transfers that she could  prior to being admitted to hospital.  Pt's daughter reports that pt has demonstrated decline today.) Patient left: in bed;with call bell/phone within reach;with bed alarm set;with family/visitor present Nurse Communication: Mobility status;Patient requests pain meds(Pt requests change of colostomy bag.) PT Visit Diagnosis: Unsteadiness on feet (R26.81);Muscle weakness (generalized) (M62.81);History of falling (Z91.81);Difficulty in walking, not elsewhere classified (R26.2)    Time: 9485-4627 PT Time Calculation (min) (ACUTE ONLY): 37 min   Charges:   PT Evaluation $PT Eval High Complexity: 1 High PT Treatments $Therapeutic Activity: 8-22 mins   PT G Codes:   PT G-Codes **NOT FOR INPATIENT CLASS** Functional Assessment Tool Used: AM-PAC 6 Clicks Basic Mobility Functional Limitation: Changing and maintaining body position Mobility: Walking and Moving Around Current Status (O3500): At least 80 percent but less than 100 percent impaired, limited or restricted Mobility: Walking and Moving Around Goal Status [phone]): At least 1 percent but less than 20 percent impaired, limited or restricted    Roxanne Gates, PT, DPT   Roxanne Gates 10/14/2017, 6:25 PM  Addendum: Treatment frequency updated/corrected to reflect  correct treatment frequency based on diagnosis per rehab guidelines. Duaa Stelzner H. Owens Shark, PT, DPT, NCS 10/14/17, 7:30 PM [phone]

## 2017-10-14 NOTE — Progress Notes (Signed)
Patient alert and oriented, disoriented to time. Denies pain. Lund sounds diminished. Patient still on 2LO2, RN will try to wean her off. Patient denies chest pain or SOB. Vitals stable.   Deri Fuelling, RN

## 2017-10-14 NOTE — Progress Notes (Signed)
Notified Dr. Darvin Neighbours of critical troponin of 0.03 received from the lab.

## 2017-10-14 NOTE — Consult Note (Signed)
Yolanda Wells is a 83 y.o. female  [id number]  Primary Cardiologist: Dr. Neoma Laming Reason for Consultation: Chest pain  HPI: 83yo female with a past medical history of DVT on coumadin, CHF, COPD who presented to emergency room with shortness of breath and chest pain. Troponin is very mildly elevated at 0.03 and BNP is WNL.    Review of Systems: Pt no longer has chest pain, breathing well.    Past Medical History:  Diagnosis Date  . Acute deep vein thrombosis (DVT) of distal vein of left lower extremity ([place]) 10/03/2015  . Acute pulmonary embolism ([place]) 10/03/2015  . Anticoagulant long-term use   . Arthritis   . CHF (congestive heart failure) ([place])   . Chronic anticoagulation 10/03/2015  . Collagen vascular disease ([place])   . Colostomy in place [facility]) 10/04/2015  . Deep venous thrombosis ([facility])    right lower extremity  . Dependent edema   . Diverticulosis of intestine with bleeding 04/02/2015  . Glenohumeral arthritis 06/28/2012  . Hammertoe 09/02/2012  . History of bilateral hip replacements 09/02/2012  . History of hysterectomy   . Hypertension   . Hypokalemia   . Inability to ambulate due to hip 10/12/2015  . Mandibular dysfunction   . Onychomycosis 09/02/2012  . Osteoarthritis   . Polycythemia vera ([place])   . Presence of IVC filter 05/25/2015  . Stroke ([place]) 03/26/2015   cerebellar  . Urinary incontinence in female 09/02/2016    Medications Prior to Admission  Medication Sig Dispense Refill  . aspirin EC 81 MG tablet Take 1 tablet (81 mg total) by mouth daily. 30 tablet 2  . docusate sodium (COLACE) 100 MG capsule Take 100 mg by mouth 2 (two) times daily.    . folic acid (FOLVITE) 710 MCG tablet Take 400 mcg by mouth daily.    . furosemide (LASIX) 20 MG tablet Take 1 tablet (20 mg total) by mouth 2 (two) times daily. 60 tablet 0  . hydroxyurea (HYDREA) 500 MG capsule TAKE FOUR CAPSULES BY MOUTH DAILY (Patient taking differently: Take 1,500-2,000 mg by mouth  daily. 500mg  to 3 pills x 5 days week, and 4 pills x 2 days a week (total weekly dose of 23 pills).) 120 capsule 0  . potassium chloride (K-DUR,KLOR-CON) 10 MEQ tablet Take 10 mEq by mouth 2 (two) times a week. On Monday and Thursday  With lasix for potassium replacement    . triamcinolone cream (KENALOG) 0.1 % Apply 1 application topically 2 (two) times daily. To affected area    . warfarin (COUMADIN) 4 MG tablet Take 1 tablet (4 mg total) by mouth daily at 6 PM. (Patient taking differently: Take 4 mg by mouth every Tuesday, Thursday, Saturday, and Sunday. ) 30 tablet 0  . warfarin (COUMADIN) 5 MG tablet Take 5 mg by mouth every Monday, Wednesday, and Friday.    [name] amLODipine (NORVASC) 5 MG tablet Take 1 tablet (5 mg total) by mouth daily. (Patient not taking: Reported on 10/06/2017) 30 tablet 11  . ENSURE (ENSURE) Take 1 Can by mouth 3 (three) times daily with meals.    . polyethylene glycol (MIRALAX / GLYCOLAX) packet Take 17 g by mouth daily.       [name] aspirin EC  81 mg Oral Daily  . furosemide  20 mg Oral BID  . hydroxyurea  1,500 mg Oral Once per day on Mon Tue Wed Thu Fri  . [START ON 10/17/2017] hydroxyurea  2,000 mg Oral Once per day on Sun  Sat  . mouth rinse  15 mL Mouth Rinse BID  . [START ON 10/15/2017] warfarin  4 mg Oral Once per day on Sun Tue Thu Sat  . [START ON 10/16/2017] warfarin  5 mg Oral Q M,W,F-1800  . warfarin  6 mg Oral ONCE-1800  . Warfarin - Pharmacist Dosing Inpatient   Does not apply q1800    Infusions:   Allergies  Allergen Reactions  . Sulfamethoxazole-Trimethoprim Nausea And Vomiting    Social History   Socioeconomic History  . Marital status: Widowed    Spouse name: Not on file  . Number of children: 5  . Years of education: Not on file  . Highest education level: Not on file  Social Needs  . Financial resource strain: Not on file  . Food insecurity - worry: Not on file  . Food insecurity - inability: Not on file  . Transportation needs - medical:  Not on file  . Transportation needs - non-medical: Not on file  Occupational History  . Occupation: retired  Tobacco Use  . Smoking status: Never Smoker  . Smokeless tobacco: Never Used  Substance and Sexual Activity  . Alcohol use: No  . Drug use: No  . Sexual activity: No  Other Topics Concern  . Not on file  Social History Narrative   [facility]    Family History  Problem Relation Age of Onset  . Brain cancer Mother   . Heart attack Father   . COPD Father   . Coronary artery disease Father   . Hypertension Unknown   . Arthritis-Osteo Unknown   . Diabetes Brother   . Arthritis Brother   . Osteosarcoma Son   . Bone cancer Son   . Arthritis Sister   . Arthritis Brother   . Hypertension Brother   . Coronary artery disease Brother     PHYSICAL EXAM: Vitals:   10/13/17 2308 10/14/17 0727  BP:  (!) 155/79  Pulse: 85 88  Resp: 19 16  Temp: (!) 97.4 F (36.3 C) 98.2 F (36.8 C)  SpO2: 97% 99%    No intake or output data in the 24 hours ending 10/14/17 0845  General:  No respiratory difficulty, slurred speech (chronic) HEENT: normal Neck: supple. no JVD. Carotids 2+ bilat; no bruits. No lymphadenopathy or thryomegaly appreciated. Cor: PMI nondisplaced. Regular rate & rhythm. No rubs, gallops or murmurs. Lungs: clear Abdomen: soft, ostomy bag in place. Extremities: no cyanosis, clubbing, rash, edema Neuro: alert & oriented x 3, ECG: No telemetry   Results for orders placed or performed during the hospital encounter of 10/13/17 (from the past 24 hour(s))  CBC with Differential     Status: Abnormal   Collection Time: 10/13/17  6:46 PM  Result Value Ref Range   WBC 6.0 3.6 - 11.0 K/uL   RBC 5.49 (H) 3.80 - 5.20 MIL/uL   Hemoglobin 14.5 12.0 - 16.0 g/dL   HCT 46.2 35.0 - 47.0 %   MCV 84.1 80.0 - 100.0 fL   MCH 26.4 26.0 - 34.0 pg   MCHC 31.4 (L) 32.0 - 36.0 g/dL   RDW 30.7 (H) 11.5 - 14.5 %   Platelets 620 (H) 150 - 440 K/uL   Neutrophils Relative  % 74 %   Lymphocytes Relative 20 %   Monocytes Relative 4 %   Eosinophils Relative 1 %   Basophils Relative 1 %   Neutro Abs 4.4 1.4 - 6.5 K/uL   Lymphs Abs 1.2 1.0 -  3.6 K/uL   Monocytes Absolute 0.2 0.2 - 0.9 K/uL   Eosinophils Absolute 0.1 0 - 0.7 K/uL   Basophils Absolute 0.1 0 - 0.1 K/uL   RBC Morphology POLYCHROMASIA PRESENT   Comprehensive metabolic panel     Status: Abnormal   Collection Time: 10/13/17  6:46 PM  Result Value Ref Range   Sodium 137 135 - 145 mmol/L   Potassium 4.3 3.5 - 5.1 mmol/L   Chloride 101 101 - 111 mmol/L   CO2 27 22 - 32 mmol/L   Glucose, Bld 99 65 - 99 mg/dL   BUN 11 6 - 20 mg/dL   Creatinine, Ser 0.61 0.44 - 1.00 mg/dL   Calcium 9.1 8.9 - 10.3 mg/dL   Total Protein 8.3 (H) 6.5 - 8.1 g/dL   Albumin 3.2 (L) 3.5 - 5.0 g/dL   AST 27 15 - 41 U/L   ALT 15 14 - 54 U/L   Alkaline Phosphatase 87 38 - 126 U/L   Total Bilirubin 0.4 0.3 - 1.2 mg/dL   GFR calc non Af Amer >60 >60 mL/min   GFR calc Af Amer >60 >60 mL/min   Anion gap 9 5 - 15  Lipase, blood     Status: None   Collection Time: 10/13/17  6:46 PM  Result Value Ref Range   Lipase 15 11 - 51 U/L  Troponin I     Status: None   Collection Time: 10/13/17  6:46 PM  Result Value Ref Range   Troponin I <0.03 <0.03 ng/mL  Brain natriuretic peptide     Status: None   Collection Time: 10/13/17  6:46 PM  Result Value Ref Range   B Natriuretic Peptide 78.0 0.0 - 100.0 pg/mL  Protime-INR     Status: Abnormal   Collection Time: 10/13/17  6:46 PM  Result Value Ref Range   Prothrombin Time 19.1 (H) 11.4 - 15.2 seconds   INR 1.62   Protime-INR     Status: Abnormal   Collection Time: 10/13/17 11:26 PM  Result Value Ref Range   Prothrombin Time 20.0 (H) 11.4 - 15.2 seconds   INR 1.72   Troponin I     Status: Abnormal   Collection Time: 10/14/17  1:19 AM  Result Value Ref Range   Troponin I 0.03 (HH) <0.03 ng/mL  Basic metabolic panel     Status: Abnormal   Collection Time: 10/14/17  5:35 AM   Result Value Ref Range   Sodium 136 135 - 145 mmol/L   Potassium 4.2 3.5 - 5.1 mmol/L   Chloride 100 (L) 101 - 111 mmol/L   CO2 25 22 - 32 mmol/L   Glucose, Bld 92 65 - 99 mg/dL   BUN 9 6 - 20 mg/dL   Creatinine, Ser 0.57 0.44 - 1.00 mg/dL   Calcium 9.1 8.9 - 10.3 mg/dL   GFR calc non Af Amer >60 >60 mL/min   GFR calc Af Amer >60 >60 mL/min   Anion gap 11 5 - 15  CBC     Status: Abnormal   Collection Time: 10/14/17  5:35 AM  Result Value Ref Range   WBC 6.5 3.6 - 11.0 K/uL   RBC 5.42 (H) 3.80 - 5.20 MIL/uL   Hemoglobin 14.3 12.0 - 16.0 g/dL   HCT 45.5 35.0 - 47.0 %   MCV 84.0 80.0 - 100.0 fL   MCH 26.4 26.0 - 34.0 pg   MCHC 31.4 (L) 32.0 - 36.0 g/dL   RDW 30.8 (H)  11.5 - 14.5 %   Platelets 595 (H) 150 - 440 K/uL  Troponin I     Status: Abnormal   Collection Time: 10/14/17  5:35 AM  Result Value Ref Range   Troponin I 0.03 (HH) <0.03 ng/mL  Protime-INR     Status: Abnormal   Collection Time: 10/14/17  5:35 AM  Result Value Ref Range   Prothrombin Time 19.6 (H) 11.4 - 15.2 seconds   INR 1.67    Dg Chest 2 View  Result Date: 10/13/2017 CLINICAL DATA:  Chest pain for several hours EXAM: CHEST  2 VIEW COMPARISON:  10/06/2017 FINDINGS: Cardiac shadow is enlarged but stable. Aortic calcifications are again seen. Elevation of the right hemidiaphragm is noted. The pulmonary artery is again prominent but stable. Lungs are well aerated bilaterally. Minimal right basilar atelectasis is seen and stable. No acute bony abnormality is noted. Significant degenerative changes of the shoulder joints are noted. IMPRESSION: Mild right basilar atelectasis.  No acute abnormality is noted. Electronically Signed   By: Inez Catalina M.D.   On: 10/13/2017 19:13   Ct Angio Chest Pe W And/or Wo Contrast  Result Date: 10/13/2017 CLINICAL DATA:  Chest pain since this morning, shortness of breath at baseline, question pulmonary embolism with high suspected pretest probability EXAM: CT ANGIOGRAPHY CHEST WITH  CONTRAST TECHNIQUE: Multidetector CT imaging of the chest was performed using the standard protocol during bolus administration of intravenous contrast. Multiplanar CT image reconstructions and MIPs were obtained to evaluate the vascular anatomy. CONTRAST:  192mL ISOVUE-370 IOPAMIDOL (ISOVUE-370) INJECTION 76% COMPARISON:  11/23/2016 FINDINGS: Cardiovascular: Atherosclerotic calcifications thoracic aorta with aneurysmal dilatation of the ascending thoracic aorta 4.1 cm. Dilatation of the central pulmonary arteries, 4.7 cm transverse main pulmonary artery, 3.3 cm RIGHT pulmonary artery and 3.0 cm LEFT pulmonary artery. Pulmonary arteries adequately opacified and grossly patent. No definite evidence of pulmonary embolism. Mitral annular calcification. Mediastinum/Nodes: Nodules in both thyroid lobes including a partially calcified exophytic nodule at inferior pole of RIGHT lobe 2.5 cm greatest size unchanged. Esophagus unremarkable. Question tiny hiatal hernia. No thoracic adenopathy. Lungs/Pleura: Significant atelectasis of RIGHT lower lobe. Elevation of RIGHT diaphragm. Subsegmental atelectasis LEFT lower lobe. No acute infiltrate, pleural effusion, or pneumothorax. Upper Abdomen: Partial visualization of a large cyst upper pole RIGHT kidney 4.5 x 4.1 cm image 93. Remaining visualized upper abdomen unremarkable. Musculoskeletal: Osseous demineralization with degenerative changes of the thoracic spine. Review of the MIP images confirms the above findings. IMPRESSION: No evidence of pulmonary embolism. Marked dilatation of the central pulmonary arteries question pulmonary artery hypertension. RIGHT basilar atelectasis.  Line multinodular thyroid gland. Aneurysmal dilatation ascending thoracic aorta 4.1 cm greatest diameter, recommendation below. Recommend annual imaging followup by CTA or MRA. This recommendation follows 2010 ACCF/AHA/AATS/ACR/ASA/SCA/SCAI/SIR/STS/SVM Guidelines for the Diagnosis and Management of  Patients with Thoracic Aortic Disease. Circulation. 2010; 121: G818-H631 Aortic Atherosclerosis (ICD10-I70.0). Aortic aneurysm NOS (ICD10-I71.9). Electronically Signed   By: Lavonia Dana M.D.   On: 10/13/2017 22:04     ASSESSMENT AND PLAN: Pt was seen in our office Monday. Abnormal EKG with RBBB and evidence of prior MI, normal LVEF and no significant valvular disease. Advised outpatient CCTA to evaluate coronaries while optimizing medical treatment. Advise conservative and palliative treatment to align with patient's treatment goals. May discharge today and has CCTA already scheduled for Friday 12/7.   Jake Bathe NP-C Page: [phone]

## 2017-10-14 NOTE — Progress Notes (Signed)
RN weaned patient to room air. Oxygen sat is 98%  Deri Fuelling, RN

## 2017-10-14 NOTE — Care Management Note (Signed)
Case Management Note  Patient Details  Name: Yolanda Wells MRN: [id number] Date of Birth: [birth year]  Subjective/Objective:   [facility] notified of admission. Patient currently active with home health services for SN, PT, OT HHA and SW                 Action/Plan:   Expected Discharge Date:                  Expected Discharge Plan:  [place]  In-House Referral:     Discharge planning Services  CM Consult  Post Acute Care Choice:  Home Health, Resumption of Svcs/PTA Provider Choice offered to:     DME Arranged:    DME Agency:     HH Arranged:  RN, PT, OT, Nurse's Aide, Social Work [employer] Agency:  [facility]  Status of Service:  In process, will continue to follow  If discussed at Long Length of Stay Meetings, dates discussed:    Additional Comments:  Jolly Mango, RN 10/14/2017, 10:58 AM

## 2017-10-14 NOTE — Progress Notes (Signed)
ANTICOAGULATION CONSULT NOTE - Follow Up Consult  Pharmacy Consult for Warfarin Indication: VTE prophylaxis  Allergies  Allergen Reactions  . Sulfamethoxazole-Trimethoprim Nausea And Vomiting    Patient Measurements: Height: 5\' 7"  (170.2 cm) Weight: 228 lb 9.6 oz (103.7 kg) IBW/kg (Calculated) : 61.6 Heparin Dosing Weight: 82.2 kg  Vital Signs: Temp: 97.4 F (36.3 C) (12/04 2308) Temp Source: Oral (12/04 2308) BP: 145/97 (12/04 2234) Pulse Rate: 85 (12/04 2308)  Labs: Recent Labs    10/12/17 1450 10/13/17 1846 10/13/17 2326  HGB 13.5 14.5  --   HCT 42.4 46.2  --   PLT 566* 620*  --   LABPROT 18.2* 19.1* 20.0*  INR 1.52 1.62 1.72  CREATININE  --  0.61  --   TROPONINI  --  <0.03  --     Estimated Creatinine Clearance: 65.9 mL/min (by C-G formula based on SCr of 0.61 mg/dL).   Medications:  Scheduled:  . aspirin EC  81 mg Oral Daily  . furosemide  20 mg Oral BID  . hydroxyurea  1,500 mg Oral Once per day on Mon Tue Wed Thu Fri  . [START ON 10/17/2017] hydroxyurea  2,000 mg Oral Once per day on Sun Sat  . warfarin  5 mg Oral Q M,W,F-1800    Assessment: Patient admitted for CP w/ h/o CHF and possible pulmonary hypertension on CT and negative for PE. Patient is anticoagulated with warfarin PTA for DVT on 11/23. Patient takes: Warfarin 5 mg on Mon-Wed-Fri (dose was recently increased at cardio office d/t suptherapeutic INR of 1.5)  12/3 @ 1450 INR 1.52 12/4 @ 1846 INR 1.62 12/4 @ 2326 INR 1.72 INR currently subtherapeutic  Goal of Therapy:  INR 2-3 Monitor platelets by anticoagulation protocol: Yes   Plan:  Will restart patient's warfarin regimen as above considering dose has just been increased. Will monitor daily CBC's and INR. Will adjust per INR trends.  Tobie Lords, PharmD, BCPS Clinical Pharmacist 10/14/2017

## 2017-10-15 ENCOUNTER — Ambulatory Visit: Payer: Medicare Other | Admitting: Family

## 2017-10-15 ENCOUNTER — Encounter
Admission: RE | Admit: 2017-10-15 | Discharge: 2017-10-15 | Disposition: A | Discharge status: Home / Self Care | Payer: Medicare Other | Source: Ambulatory Visit | Attending: Internal Medicine | Admitting: Internal Medicine

## 2017-10-15 DIAGNOSIS — R531 Weakness: Secondary | ICD-10-CM

## 2017-10-15 DIAGNOSIS — R0902 Hypoxemia: Secondary | ICD-10-CM

## 2017-10-15 DIAGNOSIS — Z7189 Other specified counseling: Secondary | ICD-10-CM

## 2017-10-15 DIAGNOSIS — J449 Chronic obstructive pulmonary disease, unspecified: Secondary | ICD-10-CM

## 2017-10-15 DIAGNOSIS — Z515 Encounter for palliative care: Secondary | ICD-10-CM

## 2017-10-15 LAB — CBC
HCT: 41.1 % (ref 35.0–47.0)
HEMOGLOBIN: 13 g/dL (ref 12.0–16.0)
MCH: 26.3 pg (ref 26.0–34.0)
MCHC: 31.5 g/dL — ABNORMAL LOW (ref 32.0–36.0)
MCV: 83.6 fL (ref 80.0–100.0)
PLATELETS: 600 10*3/uL — AB (ref 150–440)
RBC: 4.92 MIL/uL (ref 3.80–5.20)
RDW: 29.8 % — ABNORMAL HIGH (ref 11.5–14.5)
WBC: 7.5 10*3/uL (ref 3.6–11.0)

## 2017-10-15 LAB — PROTIME-INR
INR: 1.98
PROTHROMBIN TIME: 22.3 s — AB (ref 11.4–15.2)

## 2017-10-15 NOTE — Clinical Social Work Note (Signed)
Clinical Social Work Assessment  Patient Details  Name: Yolanda Wells MRN: [id number] Date of Birth: 05/10/1932  Date of referral:  10/15/17               Reason for consult:  Facility Placement                Permission sought to share information with:  Facility Contact Representative Permission granted to share information::  Yes, Verbal Permission Granted  Name::      Skilled Nursing Facility   Agency::   [facility]   Relationship::     Contact Information:     Housing/Transportation Living arrangements for the past 2 months:  Apartment Source of Information:  Patient, Other (Comment Required)(Granddaughter Yolanda Wells. ) Patient Interpreter Needed:  None Criminal Activity/Legal Involvement Pertinent to Current Situation/Hospitalization:  No - Comment as needed Significant Relationships:  Adult Children, Other(Comment)(Granddaughter Yolanda Wells. ) Lives with:  Other (Comment)(Granddaughter Yolanda Wells. ) Do you feel safe going back to the place where you live?  Yes Need for family participation in patient care:  Yes (Comment)  Care giving concerns:  Patient lives in [place] with her granddaughter Yolanda Wells.    Social Worker assessment / plan:  Clinical Social Worker (CSW) reviewed chart and noted that PT is recommending SNF. Per MD patient does not want to go to SNF. CSW contacted patient's granddaughter Yolanda Wells and made her aware of above. Per Yolanda Wells patient lives with her in [place] and has a caregiver. Per Yolanda Wells patient was at [facility] this year and discharged from [facility] in Sept 2018. CSW explained that a 60 day wellness period is needed in order for Medicare SNF days to start over. Per Yolanda Wells and chart patient should have met the 60 day wellness period for her days to start over. Per Yolanda Wells patient has a colostomy and supplies from home that she can bring to SNF if needed. Per Yolanda Wells she has a meeting with palliative today around 11:30 am and she will talk to patient about going to  SNF. Yolanda Wells requested [facility]. FL2 complete and faxed out. CSW will continue to follow and assist as needed.   Employment status:  Disabled (Comment on whether or not currently receiving Disability), Retired Insurance information:  Medicare PT Recommendations:  Skilled Nursing Facility Information / Referral to community resources:  Skilled Nursing Facility  Patient/Family's Response to care:  Patient's granddaughter requested SNF for rehab.   Patient/Family's Understanding of and Emotional Response to Diagnosis, Current Treatment, and Prognosis:  Patient's granddaughter was very pleasant and thanked CSW for assistance.   Emotional Assessment Appearance:  Appears stated age Attitude/Demeanor/Rapport:    Affect (typically observed):  Pleasant Orientation:  Oriented to Self, Oriented to Place, Fluctuating Orientation (Suspected and/or reported Sundowners), Oriented to Situation Alcohol / Substance use:  Not Applicable Psych involvement (Current and /or in the community):  No (Comment)  Discharge Needs  Concerns to be addressed:  Discharge Planning Concerns Readmission within the last 30 days:  No Current discharge risk:  Dependent with Mobility, Chronically ill Barriers to Discharge:  Continued Medical Work up   ,  M, LCSW 10/15/2017, 10:14 AM  

## 2017-10-15 NOTE — Consult Note (Signed)
Consultation Note Date: 10/15/2017   Patient Name: Yolanda Wells  DOB: 10-22-1932  MRN: [id number]  Age / Sex: 83 y.o., female  PCP: [name], MD Referring Physician: Nicholes Mango, MD  Reason for Consultation: Establishing goals of care and Psychosocial/spiritual support  HPI/Patient Profile: 83 y.o. female   admitted on 10/13/2017 with an episode of chest pain accompanied by significant shortness of breath.    Patient states that she began to have central chest pain which then spread across both sides of her chest, and this was followed by significant shortness of breath.    When patient presented to the ED she was initially hypoxic, and O2 Sats corrected on oxygen via nasal cannula.    Patient was recently admitted here for exacerbation of her heart failure.    She was felt to have pulmonary hypertension at that time as well.   CT scan today ruled out PE or pneumonia, but raise a question again for possible pulmonary   Patient has had slow physical and functional decline over the past year.      Clinical Assessment and Goals of Care:  This NP Wadie Lessen reviewed medical records, received report from team, assessed the patient and then meet at the patient's bedside along with her grand-daughter/HPOA/[name] to discuss diagnosis, prognosis, GOC, EOL wishes disposition and options.  A  discussion was had today regarding advanced directives.  Concepts specific to code status, artifical feeding and hydration, continued IV antibiotics and rehospitalization was had.  The difference between a aggressive medical intervention path  and a palliative comfort care path for this patient at this time was had.  Values and goals of care important to patient and family were attempted to be elicited.  MOST form introduced  Concept of Hospice and Palliative Care were discussed  Natural trajectory and  expectations at EOL were discussed.  Questions and concerns addressed.   Family encouraged to call with questions or concerns.  PMT will continue to support holistically.     HCPOA- grand-daughter  [name]  Green    SUMMARY OF RECOMMENDATIONS    Code Status/Advance Care Planning:  Full code    Family was very upset today realizing that patient was a documented DNR/DNI, after detailed discussion it was documented that patient is a Full Code   Palliative Prophylaxis:   Delirium Protocol and Oral Care  Additional Recommendations (Limitations, Scope, Preferences):  Full Scope Treatment   Patient and family are open to all offered and available medical interventions to prolong life.  Psycho-social/Spiritual:   Desire for further Chaplaincy support:no   Prognosis:   Unable to determine  Discharge Planning: [facility] for rehab with Palliative care service follow-up      Primary Diagnoses: Present on Admission: . Chronic diastolic CHF (congestive heart failure) ([place]) . Chest pain . COPD (chronic obstructive pulmonary disease) ([place]) . HTN (hypertension) . Hypoxia   I have reviewed the medical record, interviewed the patient and family, and examined the patient. The following aspects are pertinent.  Past Medical  History:  Diagnosis Date  . Acute deep vein thrombosis (DVT) of distal vein of left lower extremity ([place]) 10/03/2015  . Acute pulmonary embolism ([place]) 10/03/2015  . Anticoagulant long-term use   . Arthritis   . CHF (congestive heart failure) ([place])   . Chronic anticoagulation 10/03/2015  . Collagen vascular disease ([place])   . Colostomy in place [facility]) 10/04/2015  . Deep venous thrombosis ([facility])    right lower extremity  . Dependent edema   . Diverticulosis of intestine with bleeding 04/02/2015  . Glenohumeral arthritis 06/28/2012  . Hammertoe 09/02/2012  . History of bilateral hip replacements 09/02/2012  . History of hysterectomy   .  Hypertension   . Hypokalemia   . Inability to ambulate due to hip 10/12/2015  . Mandibular dysfunction   . Onychomycosis 09/02/2012  . Osteoarthritis   . Polycythemia vera ([place])   . Presence of IVC filter 05/25/2015  . Stroke ([place]) 03/26/2015   cerebellar  . Urinary incontinence in female 09/02/2016   Social History   Socioeconomic History  . Marital status: Widowed    Spouse name: None  . Number of children: 5  . Years of education: None  . Highest education level: None  Social Needs  . Financial resource strain: None  . Food insecurity - worry: None  . Food insecurity - inability: None  . Transportation needs - medical: None  . Transportation needs - non-medical: None  Occupational History  . Occupation: retired  Tobacco Use  . Smoking status: Never Smoker  . Smokeless tobacco: Never Used  Substance and Sexual Activity  . Alcohol use: No  . Drug use: No  . Sexual activity: No  Other Topics Concern  . None  Social History Narrative   [facility] at [facility]   Family History  Problem Relation Age of Onset  . Brain cancer Mother   . Heart attack Father   . COPD Father   . Coronary artery disease Father   . Hypertension Unknown   . Arthritis-Osteo Unknown   . Diabetes Brother   . Arthritis Brother   . Osteosarcoma Son   . Bone cancer Son   . Arthritis Sister   . Arthritis Brother   . Hypertension Brother   . Coronary artery disease Brother    Scheduled Meds: . aspirin EC  81 mg Oral Daily  . furosemide  20 mg Oral BID  . hydroxyurea  1,500 mg Oral Once per day on Mon Tue Wed Thu Fri  . [START ON 10/17/2017] hydroxyurea  2,000 mg Oral Once per day on Sun Sat  . mouth rinse  15 mL Mouth Rinse BID  . warfarin  4 mg Oral Once per day on Sun Tue Thu Sat  . [START ON 10/16/2017] warfarin  5 mg Oral Q M,W,F-1800  . Warfarin - Pharmacist Dosing Inpatient   Does not apply q1800   Continuous Infusions: PRN Meds:.acetaminophen **OR** acetaminophen,  ipratropium-albuterol, ondansetron **OR** ondansetron (ZOFRAN) IV, oxyCODONE Medications Prior to Admission:  Prior to Admission medications   Medication Sig Start Date End Date Taking? Authorizing Provider  aspirin EC 81 MG tablet Take 1 tablet (81 mg total) by mouth daily. 09/16/15  Yes [name], MD  docusate sodium (COLACE) 100 MG capsule Take 100 mg by mouth 2 (two) times daily.   Yes [provider]  folic acid (FOLVITE) 371 MCG tablet Take 400 mcg by mouth daily.   Yes [provider]  furosemide (LASIX) 20 MG tablet Take 1 tablet (  20 mg total) by mouth 2 (two) times daily. 10/09/17  Yes Demetrios Loll, MD  hydroxyurea (HYDREA) 500 MG capsule TAKE FOUR CAPSULES BY MOUTH DAILY Patient taking differently: Take 1,500-2,000 mg by mouth daily. 500mg  to 3 pills x 5 days week, and 4 pills x 2 days a week (total weekly dose of 23 pills). 09/14/17  Yes Corcoran, Drue Second, MD  potassium chloride (K-DUR,KLOR-CON) 10 MEQ tablet Take 10 mEq by mouth 2 (two) times a week. On Monday and Thursday  With lasix for potassium replacement   Yes [provider]  triamcinolone cream (KENALOG) 0.1 % Apply 1 application topically 2 (two) times daily. To affected area   Yes [provider]  warfarin (COUMADIN) 4 MG tablet Take 1 tablet (4 mg total) by mouth daily at 6 PM. Patient taking differently: Take 4 mg by mouth every Tuesday, Thursday, Saturday, and Sunday.  10/09/17  Yes Demetrios Loll, MD  warfarin (COUMADIN) 5 MG tablet Take 5 mg by mouth every Monday, Wednesday, and Friday.   Yes [provider]  amLODipine (NORVASC) 5 MG tablet Take 1 tablet (5 mg total) by mouth daily. Patient not taking: Reported on 10/06/2017 05/26/17 05/26/18  [name], MD  ENSURE (ENSURE) Take 1 Can by mouth 3 (three) times daily with meals.    [provider]  polyethylene glycol (MIRALAX / GLYCOLAX) packet Take 17 g by mouth daily.    [provider]    Allergies  Allergen Reactions  . Sulfamethoxazole-Trimethoprim Nausea And Vomiting   Review of Systems  Constitutional:       -patient reports sadness 2/2 to death of her brother 2 weeks ago  Neurological: Positive for weakness.    Physical Exam  Constitutional: She appears well-developed.  Cardiovascular: Normal rate, regular rhythm and normal heart sounds.  Pulmonary/Chest: Effort normal and breath sounds normal.  Abdominal: Soft. Normal appearance and bowel sounds are normal.  Skin: Skin is warm and dry.    Vital Signs: BP 118/61 (BP Location: Right Arm)   Pulse 96   Temp 97.6 F (36.4 C) (Oral)   Resp (!) 28   Ht 5\' 7"  (1.702 m)   Wt 108 kg (238 lb)   SpO2 95%   BMI 37.28 kg/m  Pain Assessment: 0-10 POSS *See Group Information*: 1-Acceptable,Awake and alert Pain Score: Asleep   SpO2: SpO2: 95 % O2 Device:SpO2: 95 % O2 Flow Rate: .O2 Flow Rate (L/min): 2 L/min  IO: Intake/output summary:   Intake/Output Summary (Last 24 hours) at 10/15/2017 1225 Last data filed at 10/15/2017 1026 Gross per 24 hour  Intake 240 ml  Output 200 ml  Net 40 ml    LBM: Last BM Date: 10/13/17 Baseline Weight: Weight: 94.3 kg (208 lb) Most recent weight: Weight: 108 kg (238 lb)     Palliative Assessment/Data:  50 %   Discussed with Bedside RN  Time In: 1100 Time Out: 1200 Time Total: 60 min Greater than 50%  of this time was spent counseling and coordinating care related to the above assessment and plan.  Signed by: Wadie Lessen, NP   Please contact Palliative Medicine Team phone at [phone] for questions and concerns.  For individual provider: See Shea Evans

## 2017-10-15 NOTE — Progress Notes (Signed)
Changed pt's colotomy bag

## 2017-10-15 NOTE — Progress Notes (Signed)
SUBJECTIVE: Patient has no further chest pain   Vitals:   10/14/17 1240 10/14/17 2045 10/15/17 0458 10/15/17 0500  BP:  126/70 124/64   Pulse: 68 99 (!) 110   Resp:  16    Temp:  98.8 F (37.1 C) 98.2 F (36.8 C)   TempSrc:  Oral Oral   SpO2: 98% 98% 92%   Weight:    238 lb (108 kg)  Height:        Intake/Output Summary (Last 24 hours) at 10/15/2017 0828 Last data filed at 10/14/2017 2200 Gross per 24 hour  Intake 120 ml  Output 450 ml  Net -330 ml    LABS: Basic Metabolic Panel: Recent Labs    10/13/17 1846 10/14/17 0535  NA 137 136  K 4.3 4.2  CL 101 100*  CO2 27 25  GLUCOSE 99 92  BUN 11 9  CREATININE 0.61 0.57  CALCIUM 9.1 9.1   Liver Function Tests: Recent Labs    10/13/17 1846  AST 27  ALT 15  ALKPHOS 87  BILITOT 0.4  PROT 8.3*  ALBUMIN 3.2*   Recent Labs    10/13/17 1846  LIPASE 15   CBC: Recent Labs    10/12/17 1450 10/13/17 1846 10/14/17 0535 10/15/17 0313  WBC 3.5* 6.0 6.5 7.5  NEUTROABS 2.2 4.4  --   --   HGB 13.5 14.5 14.3 13.0  HCT 42.4 46.2 45.5 41.1  MCV 82.8 84.1 84.0 83.6  PLT 566* 620* 595* 600*   Cardiac Enzymes: Recent Labs    10/14/17 0119 10/14/17 0535 10/14/17 1139  TROPONINI 0.03* 0.03* 0.03*   BNP: Invalid input(s): POCBNP D-Dimer: No results for input(s): DDIMER in the last 72 hours. Hemoglobin A1C: No results for input(s): HGBA1C in the last 72 hours. Fasting Lipid Panel: No results for input(s): CHOL, HDL, LDLCALC, TRIG, CHOLHDL, LDLDIRECT in the last 72 hours. Thyroid Function Tests: No results for input(s): TSH, T4TOTAL, T3FREE, THYROIDAB in the last 72 hours.  Invalid input(s): FREET3 Anemia Panel: No results for input(s): VITAMINB12, FOLATE, FERRITIN, TIBC, IRON, RETICCTPCT in the last 72 hours.   PHYSICAL EXAM General: Well developed, well nourished, in no acute distress HEENT:  Normocephalic and atramatic Neck:  No JVD.  Lungs: Clear bilaterally to auscultation and percussion. Heart:  HRRR . Normal S1 and S2 without gallops or murmurs.  Abdomen: Bowel sounds are positive, abdomen soft and non-tender  Msk:  Back normal, normal gait. Normal strength and tone for age. Extremities: No clubbing, cyanosis or edema.   Neuro: Alert and oriented X 3. Psych:  Good affect, responds appropriately  TELEMETRY: Sinus rhythm  ASSESSMENT AND PLAN: Status post episode of chest pain and feeling much better now. Patient can be discharged with CTA  coronaries tomorrow morning in the office.  Principal Problem:   Hypoxia Active Problems:   HTN (hypertension)   Chest pain   COPD (chronic obstructive pulmonary disease) (HCC)   Chronic diastolic CHF (congestive heart failure) (HCC)    Ellison Leisure A, MD, [facility] 10/15/2017 8:28 AM

## 2017-10-15 NOTE — Clinical Social Work Placement (Signed)
   CLINICAL SOCIAL WORK PLACEMENT  NOTE  Date:  10/15/2017  Patient Details  Name: Yolanda Wells MRN: [id number] Date of Birth: April 15, 1932  Clinical Social Work is seeking post-discharge placement for this patient at the [place] level of care (*CSW will initial, date and re-position this form in  chart as items are completed):  Yes   Patient/family provided with [place] Work Department's list of facilities offering this level of care within the geographic area requested by the patient (or if unable, by the patient's family).  Yes   Patient/family informed of their freedom to choose among providers that offer the needed level of care, that participate in Medicare, Medicaid or managed care program needed by the patient, have an available bed and are willing to accept the patient.  Yes   Patient/family informed of [facility]'s ownership interest in [facility] and [facility], as well as of the fact that they are under no obligation to receive care at these facilities.  PASRR submitted to EDS on       PASRR number received on       Existing PASRR number confirmed on 10/15/17     FL2 transmitted to all facilities in geographic area requested by pt/family on 10/15/17     FL2 transmitted to all facilities within larger geographic area on       Patient informed that his/her managed care company has contracts with or will negotiate with certain facilities, including the following:            Patient/family informed of bed offers received.  Patient chooses bed at       Physician recommends and patient chooses bed at      Patient to be transferred to   on  .  Patient to be transferred to facility by       Patient family notified on   of transfer.  Name of family member notified:        PHYSICIAN       Additional Comment:    _______________________________________________ Akeela Busk, Veronia Beets, LCSW 10/15/2017, 10:13 AM

## 2017-10-15 NOTE — [place] FL2 (Signed)
[place] LEVEL OF CARE SCREENING TOOL     IDENTIFICATION  Patient Name: Yolanda Wells Birthdate: [birth year] Sex: female Admission Date (Current Location): 10/13/2017  [place] and [place] Number:  Engineering geologist and Address:  [facility], [address] [place], [place] [address]      Provider Number: [id number]  Attending Physician Name and Address:  Nicholes Mango, MD  Relative Name and Phone Number:       Current Level of Care: Hospital Recommended Level of Care: [place] Prior Approval Number:    Date Approved/Denied:   PASRR Number: [id number])  Discharge Plan: SNF    Current Diagnoses: Patient Active Problem List   Diagnosis Date Noted  . Hypoxia 10/13/2017  . Acute CHF (congestive heart failure) ([place]) 10/06/2017  . Chronic diastolic CHF (congestive heart failure) ([place]) 07/28/2017  . Dyspnea 05/27/2017  . Ileus ([place]) 04/10/2017  . Influenza with respiratory manifestation 12/01/2016  . Respiratory distress 11/23/2016  . Urinary incontinence in female 09/02/2016  . Vision loss of right eye 08/26/2016  . Blindness of right eye 08/26/2016  . Inability to ambulate due to hip 10/12/2015  . Colostomy in place [facility]) 10/04/2015  . Chronic anticoagulation 10/03/2015  . Acute pulmonary embolism ([place]) 10/03/2015  . Leukocytosis 09/25/2015  . Chest pain 09/25/2015  . COPD exacerbation ([place]) 09/24/2015  . CAP (community acquired pneumonia) 09/24/2015  . COPD (chronic obstructive pulmonary disease) ([place]) 09/24/2015  . SOB (shortness of breath) 09/22/2015  . Elevated troponin 09/15/2015  . UTI (urinary tract infection) 07/16/2015  . Abdominal wall cellulitis 07/15/2015  . DVT (deep venous thrombosis) ([place]) 07/15/2015  . HTN (hypertension) 07/15/2015  . Polycythemia vera ([place]) 07/15/2015  . Dependent edema 07/15/2015  . Arthritis 07/15/2015  . Pulmonary emboli ([place]) 05/25/2015  . Diverticulosis of colon  with hemorrhage 04/02/2015  . Diverticulosis of intestine with bleeding 04/02/2015  . Dehydration, moderate 01/11/2015  . Fall 01/10/2015  . Osteoarthritis 01/10/2015  . Hammertoe 09/02/2012  . S/P transmetatarsal amputation of foot ([place]) 09/02/2012  . Onychomycosis 09/02/2012  . Other specified dermatoses 09/02/2012  . Hip pain 08/23/2012  . S/P hip replacement 08/23/2012  . Glenohumeral arthritis 06/28/2012    Orientation RESPIRATION BLADDER Height & Weight     Self, Situation, Place  Normal Incontinent Weight: 238 lb (108 kg) Height:  5\' 7"  (170.2 cm)  BEHAVIORAL SYMPTOMS/MOOD NEUROLOGICAL BOWEL NUTRITION STATUS      Colostomy, Incontinent Diet(Diet: Heart Healthy)  AMBULATORY STATUS COMMUNICATION OF NEEDS Skin   Extensive Assist Verbally Normal                       Personal Care Assistance Level of Assistance  Bathing, Feeding, Dressing Bathing Assistance: Limited assistance Feeding assistance: Independent Dressing Assistance: Limited assistance     Functional Limitations Info  Sight, Hearing, Speech Sight Info: Adequate Hearing Info: Adequate Speech Info: Adequate    SPECIAL CARE FACTORS FREQUENCY  PT (By licensed PT), OT (By licensed OT)     PT Frequency: (5) OT Frequency: (5)            Contractures      Additional Factors Info  Code Status, Allergies Code Status Info: (DNR ) Allergies Info: (Sulfamethoxazole-trimethoprim)           Current Medications (10/15/2017):  This is the current hospital active medication list Current Facility-Administered Medications  Medication Dose Route Frequency Provider Last Rate Last Dose  . acetaminophen (TYLENOL) tablet 650  mg  650 mg Oral Q6H PRN Lance Coon, MD   650 mg at 10/14/17 1701   Or  . acetaminophen (TYLENOL) suppository 650 mg  650 mg Rectal Q6H PRN Lance Coon, MD      . aspirin EC tablet 81 mg  81 mg Oral Daily Lance Coon, MD   81 mg at 10/15/17 0854  . furosemide (LASIX) tablet 20 mg   20 mg Oral BID Lance Coon, MD   20 mg at 10/15/17 0855  . hydroxyurea (HYDREA) capsule 1,500 mg  1,500 mg Oral Once per day on Mon Tue Wed Thu Fri Willis, David, MD   1,500 mg at 10/15/17 0855  . [START ON 10/17/2017] hydroxyurea (HYDREA) capsule 2,000 mg  2,000 mg Oral Once per day on Sun Sat Lance Coon, MD      . ipratropium-albuterol (DUONEB) 0.5-2.5 (3) MG/3ML nebulizer solution 3 mL  3 mL Nebulization Q4H PRN Lance Coon, MD      . MEDLINE mouth rinse  15 mL Mouth Rinse BID Lance Coon, MD   15 mL at 10/14/17 2202  . ondansetron (ZOFRAN) tablet 4 mg  4 mg Oral Q6H PRN Lance Coon, MD       Or  . ondansetron [facility]) injection 4 mg  4 mg Intravenous Q6H PRN Lance Coon, MD      . oxyCODONE (Oxy IR/ROXICODONE) immediate release tablet 5 mg  5 mg Oral Q4H PRN Lance Coon, MD   5 mg at 10/14/17 2200  . warfarin (COUMADIN) tablet 4 mg  4 mg Oral Once per day on Sun Tue Thu Sat Hillary Bow, MD      . Derrill Memo ON 10/16/2017] warfarin (COUMADIN) tablet 5 mg  5 mg Oral Q M,W,F-1800 Hillary Bow, MD      . Warfarin - Pharmacist Dosing Inpatient   Does not apply q1800 Hillary Bow, MD         Discharge Medications: Please see discharge summary for a list of discharge medications.  Relevant Imaging Results:  Relevant Lab Results:   Additional Information (SSN: [id number])  Sample, Veronia Beets, LCSW

## 2017-10-15 NOTE — Progress Notes (Addendum)
Clinical Education officer, museum (CSW) contacted patient's granddaughter [name] and presented bed offers. [name] chose [facility] admissions coordinator at [facility] is aware of accepted bed offer. Patient is in agreement with going to [facility].  [employer], LCSW [phone]

## 2017-10-15 NOTE — Progress Notes (Addendum)
[place] [facility] [place] NAME: Natalija Mavis    MR#:  [id number]  DATE OF BIRTH:  11/19/1931  SUBJECTIVE:  CHIEF COMPLAINT:   Chief Complaint  Patient presents with  . Chest Pain   Delirious, resting comfortably, off oxygen  REVIEW OF SYSTEMS:    Review of Systems  Unable to perform ROS: Dementia    DRUG ALLERGIES:   Allergies  Allergen Reactions  . Sulfamethoxazole-Trimethoprim Nausea And Vomiting    VITALS:  Blood pressure 118/61, pulse 96, temperature 97.6 F (36.4 C), temperature source Oral, resp. rate (!) 28, height 5\' 7"  (1.702 m), weight 108 kg (238 lb), SpO2 95 %.  PHYSICAL EXAMINATION:   Physical Exam  GENERAL:  83 y.o.-year-old patient lying in the bed with no acute distress.  EYES: Pupils equal, round, reactive to light and accommodation. No scleral icterus. Extraocular muscles intact.  HEENT: Head atraumatic, normocephalic. Oropharynx and nasopharynx clear.  NECK:  Supple, no jugular venous distention. No thyroid enlargement, no tenderness.  LUNGS: Normal breath sounds bilaterally, no wheezing, rales, rhonchi. No use of accessory muscles of respiration.  CARDIOVASCULAR: S1, S2 normal. No murmurs, rubs, or gallops.  ABDOMEN: Soft, nontender, nondistended. Bowel sounds present. No organomegaly or mass.  EXTREMITIES: No cyanosis, clubbing or edema b/l.    NEUROLOGIC:  Patient is delirious PSYCHIATRIC: The patient is alert . Not oriented   LABORATORY PANEL:   CBC Recent Labs  Lab 10/15/17 0313  WBC 7.5  HGB 13.0  HCT 41.1  PLT 600*   ------------------------------------------------------------------------------------------------------------------ Chemistries  Recent Labs  Lab 10/13/17 1846 10/14/17 0535  NA 137 136  K 4.3 4.2  CL 101 100*  CO2 27 25  GLUCOSE 99 92  BUN 11 9  CREATININE 0.61 0.57  CALCIUM 9.1 9.1  AST 27  --   ALT 15  --   ALKPHOS 87  --   BILITOT 0.4  --     ------------------------------------------------------------------------------------------------------------------  Cardiac Enzymes Recent Labs  Lab 10/14/17 1139  TROPONINI 0.03*   ------------------------------------------------------------------------------------------------------------------  RADIOLOGY:  Dg Chest 2 View  Result Date: 10/13/2017 CLINICAL DATA:  Chest pain for several hours EXAM: CHEST  2 VIEW COMPARISON:  10/06/2017 FINDINGS: Cardiac shadow is enlarged but stable. Aortic calcifications are again seen. Elevation of the right hemidiaphragm is noted. The pulmonary artery is again prominent but stable. Lungs are well aerated bilaterally. Minimal right basilar atelectasis is seen and stable. No acute bony abnormality is noted. Significant degenerative changes of the shoulder joints are noted. IMPRESSION: Mild right basilar atelectasis.  No acute abnormality is noted. Electronically Signed   By: Inez Catalina M.D.   On: 10/13/2017 19:13   Ct Angio Chest Pe W And/or Wo Contrast  Result Date: 10/13/2017 CLINICAL DATA:  Chest pain since this morning, shortness of breath at baseline, question pulmonary embolism with high suspected pretest probability EXAM: CT ANGIOGRAPHY CHEST WITH CONTRAST TECHNIQUE: Multidetector CT imaging of the chest was performed using the standard protocol during bolus administration of intravenous contrast. Multiplanar CT image reconstructions and MIPs were obtained to evaluate the vascular anatomy. CONTRAST:  128mL ISOVUE-370 IOPAMIDOL (ISOVUE-370) INJECTION 76% COMPARISON:  11/23/2016 FINDINGS: Cardiovascular: Atherosclerotic calcifications thoracic aorta with aneurysmal dilatation of the ascending thoracic aorta 4.1 cm. Dilatation of the central pulmonary arteries, 4.7 cm transverse main pulmonary artery, 3.3 cm RIGHT pulmonary artery and 3.0 cm LEFT pulmonary artery. Pulmonary arteries adequately opacified and grossly patent. No definite evidence of  pulmonary embolism. Mitral annular calcification.  Mediastinum/Nodes: Nodules in both thyroid lobes including a partially calcified exophytic nodule at inferior pole of RIGHT lobe 2.5 cm greatest size unchanged. Esophagus unremarkable. Question tiny hiatal hernia. No thoracic adenopathy. Lungs/Pleura: Significant atelectasis of RIGHT lower lobe. Elevation of RIGHT diaphragm. Subsegmental atelectasis LEFT lower lobe. No acute infiltrate, pleural effusion, or pneumothorax. Upper Abdomen: Partial visualization of a large cyst upper pole RIGHT kidney 4.5 x 4.1 cm image 93. Remaining visualized upper abdomen unremarkable. Musculoskeletal: Osseous demineralization with degenerative changes of the thoracic spine. Review of the MIP images confirms the above findings. IMPRESSION: No evidence of pulmonary embolism. Marked dilatation of the central pulmonary arteries question pulmonary artery hypertension. RIGHT basilar atelectasis.  Line multinodular thyroid gland. Aneurysmal dilatation ascending thoracic aorta 4.1 cm greatest diameter, recommendation below. Recommend annual imaging followup by CTA or MRA. This recommendation follows 2010 ACCF/AHA/AATS/ACR/ASA/SCA/SCAI/SIR/STS/SVM Guidelines for the Diagnosis and Management of Patients with Thoracic Aortic Disease. Circulation. 2010; 121: T888-K800 Aortic Atherosclerosis (ICD10-I70.0). Aortic aneurysm NOS (ICD10-I71.9). Electronically Signed   By: Lavonia Dana M.D.   On: 10/13/2017 22:04     ASSESSMENT AND PLAN:   * Acute hypoxic resp failure likely due to chronic lung disease Resolved, off oxygen Pulmonary consult is pending Will need OP sleep study and PFTs Paged Dr. Raul Del , Awaiting call back  * Chest pain Seen by cardiology Discussed with Dr. Humphrey Rolls CCTA on Friday as OP  *History of DVT/ pulmonary embolism On Coumadin INR 1.98  *Chronic diastolic congestive heart failure Not fluid overloaded, continue current medication  PT eval- SNF -[facility]  tomorrow   All the records are reviewed and case discussed with Care Management/Social Worker Call placed to patient's granddaughter to call us back  CODE STATUS: FC  DVT Prophylaxis: ON coumadin  TOTAL TIME TAKING CARE OF THIS PATIENT: 30 minutes.   POSSIBLE D/C IN 1-2 DAYS, DEPENDING ON CLINICAL CONDITION.  Nicholes Mango M.D on 10/15/2017 at 1:51 PM  Between 7am to 6pm - Pager - [phone]  After 6pm go to [email or web address] - password [email or web address]  Office  [phone]  CC: Primary care physician; [name], MD  Note: This dictation was prepared with Dragon dictation along with smaller phrase technology. Any transcriptional errors that result from this process are unintentional.

## 2017-10-15 NOTE — Progress Notes (Signed)
ANTICOAGULATION CONSULT NOTE - Follow Up Consult  Pharmacy Consult for Warfarin Indication: VTE prophylaxis  Allergies  Allergen Reactions  . Sulfamethoxazole-Trimethoprim Nausea And Vomiting    Patient Measurements: Height: 5\' 7"  (170.2 cm) Weight: 238 lb (108 kg) IBW/kg (Calculated) : 61.6 Heparin Dosing Weight: 82.2 kg  Vital Signs: Temp: 97.6 F (36.4 C) (12/06 0820) Temp Source: Oral (12/06 0820) BP: 118/61 (12/06 0820) Pulse Rate: 96 (12/06 0820)  Labs: Recent Labs    10/13/17 1846 10/13/17 2326 10/14/17 0119 10/14/17 0535 10/14/17 1139 10/15/17 0313  HGB 14.5  --   --  14.3  --  13.0  HCT 46.2  --   --  45.5  --  41.1  PLT 620*  --   --  595*  --  600*  LABPROT 19.1* 20.0*  --  19.6*  --  22.3*  INR 1.62 1.72  --  1.67  --  1.98  CREATININE 0.61  --   --  0.57  --   --   TROPONINI <0.03  --  0.03* 0.03* 0.03*  --     Estimated Creatinine Clearance: 67.5 mL/min (by C-G formula based on SCr of 0.57 mg/dL).   Medications:  Scheduled:  . aspirin EC  81 mg Oral Daily  . furosemide  20 mg Oral BID  . hydroxyurea  1,500 mg Oral Once per day on Mon Tue Wed Thu Fri  . [START ON 10/17/2017] hydroxyurea  2,000 mg Oral Once per day on Sun Sat  . mouth rinse  15 mL Mouth Rinse BID  . warfarin  4 mg Oral Once per day on Sun Tue Thu Sat  . [START ON 10/16/2017] warfarin  5 mg Oral Q M,W,F-1800  . Warfarin - Pharmacist Dosing Inpatient   Does not apply q1800    Assessment: Patient admitted for CP w/ h/o CHF and possible pulmonary hypertension on CT and negative for PE. Patient is anticoagulated with warfarin PTA for DVT on 11/23. Patient takes: Warfarin 5 mg on Mon-Wed-Fri (dose was recently increased at cardio office d/t suptherapeutic INR of 1.5) Warfarin 4 mg Tues-Thur-Sat-Sun  12/3 @ 1450 INR 1.52 12/4 @ 1846 INR 1.62 12/4 @ 2326 INR 1.72  Warfarin 4 mg (last dose taken PTA) 12/5  INR  1.67  Warfarin 6mg  12/6  INR  1.98   Goal of Therapy:  INR  2-3 Monitor platelets by anticoagulation protocol: Yes   Plan:   Will continue home warfarin regimen as above considering dose has just been increased per office visit note 10/12/17. Will monitor daily INR and every 3 day CBC. Will adjust per INR trends.  Paulina Fusi, PharmD, BCPS 10/15/2017 2:44 PM

## 2017-10-15 NOTE — Progress Notes (Signed)
Checked SNF days through [facility] [phone]).  According to their automated system,  As a of the last billing date of 07/29/17, the patient had 0 full SNF days and 50 co-SNF days available.   Days should have reset after 60 days.  Confirmed with [employer] that patient should have full 20 full SNF and 80 co-SNF days available.

## 2017-10-15 NOTE — Progress Notes (Signed)
Date: 10/15/2017,   MRN# [id number] Yolanda Wells 27-May-1932 Code Status:     Code Status Orders  (From admission, onward)        Start     Ordered   10/15/17 1141  Full code  Continuous     10/15/17 1141    Code Status History    Date Active Date Inactive Code Status Order ID Comments User Context   10/13/2017 22:46 10/15/2017 11:41 DNR [id number]  Lance Coon, MD ED   10/06/2017 14:54 10/09/2017 20:58 DNR [id number]  Nicholes Mango, MD Inpatient   10/06/2017 11:11 10/06/2017 14:53 Full Code [id number]  Nicholes Mango, MD Inpatient   05/27/2017 19:54 05/28/2017 20:13 Full Code [id number]  Vaughan Basta, MD Inpatient   05/23/2017 23:22 05/26/2017 16:14 Full Code [id number]  Lance Coon, MD Inpatient   04/10/2017 03:25 04/11/2017 19:16 Full Code [id number]  Harrie Foreman, MD Inpatient   11/23/2016 06:29 12/01/2016 19:35 Full Code [id number]  Harrie Foreman, MD Inpatient   08/26/2016 04:45 08/26/2016 19:23 Full Code [id number]  Saundra Shelling, MD Inpatient   09/22/2015 17:55 09/25/2015 18:44 Full Code [id number]  Demetrios Loll, MD ED   09/15/2015 03:41 09/17/2015 17:41 Full Code [id number]  Harrie Foreman, MD Inpatient   07/16/2015 01:51 07/20/2015 23:34 Full Code [id number]  Lance Coon, MD Inpatient   05/25/2015 08:34 05/29/2015 16:16 Full Code [id number]  Harrie Foreman, MD Inpatient   04/02/2015 06:06 04/08/2015 16:51 Full Code [id number]  Harrie Foreman, MD Inpatient    Advance Directive Documentation     Most Recent Value  Type of Advance Directive  Healthcare Power of [place], Living will  Pre-existing out of facility DNR order (yellow form or pink MOST form)  No data  "MOST" Form in Place?  No data     Hosp day:@LENGTHOFSTAYDAYS @ Referring MD: @ATDPROV @        CC: ? Pulmonary hypertension, hypoxia  HPI: This is an 83 yr old lady. Back to [place] with chest pain and low sats ( 88 %) Chest ct scan noted. The pulmonary artery enlargement/ thoracic aneurysm. Echo showed normal  right ventricular function. RVSP is in normal range. No right sided heart failure sxs She is presently off oxygen and confortable. No wheezing, no coughing, no chest pain. See last note.     PMHX:   Past Medical History:  Diagnosis Date  . Acute deep vein thrombosis (DVT) of distal vein of left lower extremity ([place]) 10/03/2015  . Acute pulmonary embolism ([place]) 10/03/2015  . Anticoagulant long-term use   . Arthritis   . CHF (congestive heart failure) ([place])   . Chronic anticoagulation 10/03/2015  . Collagen vascular disease ([place])   . Colostomy in place [facility]) 10/04/2015  . Deep venous thrombosis ([facility])    right lower extremity  . Dependent edema   . Diverticulosis of intestine with bleeding 04/02/2015  . Glenohumeral arthritis 06/28/2012  . Hammertoe 09/02/2012  . History of bilateral hip replacements 09/02/2012  . History of hysterectomy   . Hypertension   . Hypokalemia   . Inability to ambulate due to hip 10/12/2015  . Mandibular dysfunction   . Onychomycosis 09/02/2012  . Osteoarthritis   . Polycythemia vera ([place])   . Presence of IVC filter 05/25/2015  . Stroke ([place]) 03/26/2015   cerebellar  . Urinary incontinence in female 09/02/2016   Surgical Hx:  Past Surgical History:  Procedure Laterality Date  . ABDOMINAL HYSTERECTOMY    . BLEPHAROPLASTY Right 03/2017   right upper eyelid  .  CARDIAC CATHETERIZATION Right 04/03/2015   Procedure: CENTRAL LINE INSERTION;  Surgeon: Sherri Rad, MD;  Location: [facility] ORS;  Service: General;  Laterality: Right;  . COLECTOMY WITH COLOSTOMY CREATION/HARTMANN PROCEDURE N/A 04/03/2015   Procedure: COLECTOMY WITH COLOSTOMY CREATION/HARTMANN PROCEDURE;  Surgeon: Sherri Rad, MD;  Location: [facility] ORS;  Service: General;  Laterality: N/A;  . FOOT AMPUTATION     partial  . FOOT AMPUTATION Right   . JOINT REPLACEMENT     Left and Right Hip  . PERIPHERAL VASCULAR CATHETERIZATION N/A 04/02/2015   Procedure: Visceral Angiography;  Surgeon: Algernon Huxley, MD;   Location: [place] CV LAB;  Service: Cardiovascular;  Laterality: N/A;  . PERIPHERAL VASCULAR CATHETERIZATION N/A 04/02/2015   Procedure: Visceral Artery Intervention;  Surgeon: Algernon Huxley, MD;  Location: [place] [facility] LAB;  Service: Cardiovascular;  Laterality: N/A;  . PERIPHERAL VASCULAR CATHETERIZATION N/A 05/25/2015   Procedure: IVC Filter Insertion;  Surgeon: Katha Cabal, MD;  Location: [place] CV LAB;  Service: Cardiovascular;  Laterality: N/A;  . TOE AMPUTATION     right  . TOTAL HIP ARTHROPLASTY     Family Hx:  Family History  Problem Relation Age of Onset  . Brain cancer Mother   . Heart attack Father   . COPD Father   . Coronary artery disease Father   . Hypertension Unknown   . Arthritis-Osteo Unknown   . Diabetes Brother   . Arthritis Brother   . Osteosarcoma Son   . Bone cancer Son   . Arthritis Sister   . Arthritis Brother   . Hypertension Brother   . Coronary artery disease Brother    Social Hx:   Social History   Tobacco Use  . Smoking status: Never Smoker  . Smokeless tobacco: Never Used  Substance Use Topics  . Alcohol use: No  . Drug use: No   Medication:    Home Medication:    Current Medication: @CURMEDTAB @   Allergies:  Sulfamethoxazole-trimethoprim  Review of Systems: ? Accuracy due to her dementia Gen:  Denies  fever, sweats, chills HEENT: Denies blurred vision, double vision, ear pain, eye pain, hearing loss, nose bleeds, sore throat Cvc:  No dizziness, chest pain or heaviness Resp:   Less so Gi: Denies swallowing difficulty, stomach pain, nausea or vomiting, diarrhea, constipation, bowel incontinence Gu:  Denies bladder incontinence, burning urine Ext:   No Joint pain, stiffness or swelling Skin: No skin rash, easy bruising or bleeding or hives Endoc:  No polyuria, polydipsia , polyphagia or weight change Psych: No depression, insomnia or hallucinations  Other:  All other systems negative  Physical Examination:    VS: BP 118/61 (BP Location: Right Arm)   Pulse 96   Temp 97.6 F (36.4 C) (Oral)   Resp (!) 28   Ht 5\' 7"  (1.702 m)   Wt 238 lb (108 kg)   SpO2 95%   BMI 37.28 kg/m   General Appearance: No distress, off oxygen Neuro: without focal findings, mental status, speech normal, alert, cranial nerves 2-12 intact, reflexes normal and symmetric, sensation grossly normal  HEENT: PERRLA, EOM intact, no ptosis, no other lesions noticed NECK: Supple, no stridor, no JVD Pulmonary:.No wheezing, No rales   Cardiovascular:  Normal S1,S2.  No m/r/g.   Abdomen:Benign, Soft, non-tender, No masses, hepatosplenomegaly, No lymphadenopathy Endoc: No evident thyromegaly, no signs of acromegaly or Cushing features Skin:   warm, no rashes, no ecchymosis  Extremities: normal, no cyanosis, clubbing, no edema, warm with normal capillary  refill.   Labs results:   Recent Labs    10/13/17 1846 10/13/17 2326 10/14/17 0535 10/15/17 0313  HGB 14.5  --  14.3 13.0  HCT 46.2  --  45.5 41.1  MCV 84.1  --  84.0 83.6  WBC 6.0  --  6.5 7.5  BUN 11  --  9  --   CREATININE 0.61  --  0.57  --   GLUCOSE 99  --  92  --   CALCIUM 9.1  --  9.1  --   INR 1.62 1.72 1.67 1.98  ,    Rad results:  CLINICAL DATA:  Chest pain since this morning, shortness of breath at baseline, question pulmonary embolism with high suspected pretest probability  EXAM: CT ANGIOGRAPHY CHEST WITH CONTRAST  TECHNIQUE: Multidetector CT imaging of the chest was performed using the standard protocol during bolus administration of intravenous contrast. Multiplanar CT image reconstructions and MIPs were obtained to evaluate the vascular anatomy.  CONTRAST:  17mL ISOVUE-370 IOPAMIDOL (ISOVUE-370) INJECTION 76%  COMPARISON:  11/23/2016  FINDINGS: Cardiovascular: Atherosclerotic calcifications thoracic aorta with aneurysmal dilatation of the ascending thoracic aorta 4.1 cm. Dilatation of the central pulmonary arteries, 4.7 cm  transverse main pulmonary artery, 3.3 cm RIGHT pulmonary artery and 3.0 cm LEFT pulmonary artery. Pulmonary arteries adequately opacified and grossly patent. No definite evidence of pulmonary embolism. Mitral annular calcification.  Mediastinum/Nodes: Nodules in both thyroid lobes including a partially calcified exophytic nodule at inferior pole of RIGHT lobe 2.5 cm greatest size unchanged. Esophagus unremarkable. Question tiny hiatal hernia. No thoracic adenopathy.  Lungs/Pleura: Significant atelectasis of RIGHT lower lobe. Elevation of RIGHT diaphragm. Subsegmental atelectasis LEFT lower lobe. No acute infiltrate, pleural effusion, or pneumothorax.  Upper Abdomen: Partial visualization of a large cyst upper pole RIGHT kidney 4.5 x 4.1 cm image 93. Remaining visualized upper abdomen unremarkable.  Musculoskeletal: Osseous demineralization with degenerative changes of the thoracic spine.  Review of the MIP images confirms the above findings.  IMPRESSION: No evidence of pulmonary embolism.  Marked dilatation of the central pulmonary arteries question pulmonary artery hypertension.  RIGHT basilar atelectasis.  Line multinodular thyroid gland.  Aneurysmal dilatation ascending thoracic aorta 4.1 cm greatest diameter, recommendation below. Recommend annual imaging followup by CTA or MRA. This recommendation follows 2010 ACCF/AHA/AATS/ACR/ASA/SCA/SCAI/SIR/STS/SVM Guidelines for the Diagnosis and Management of Patients with Thoracic Aortic Disease. Circulation. 2010; 121: H885-O277  Aortic Atherosclerosis (ICD10-I70.0).  Aortic aneurysm NOS (ICD10-I71.9).   Electronically Signed   By: Lavonia Dana M.D.   On: 10/13/2017 22:04   Order-Level Documents:   There are no order-level documents.     Assessment and Plan: She came in with chest pain, low sats. No pulmonary embolism. At baseline now,off oxygen.? Etiology.  Base  on the totality of the findings I can  not rule in nor rule out  pulmonary hypertension at this time( echo is not supportive, the chest ct   rules out CEPTH, no right heart failure physical findings). The enlarge pulmonary arteries may be a normal variant. At present not eager to get right heart cath unless cardiology is considering left heart cath. What I would order at this time is a  bubble echo and see if there is a PFO. (patent foramen ovale)   Her sob is multifactorial: weight, anxiety, ? diastolic chf, atelectasis, elevated diaphragm ? Paralysis. Doubt copd ( never smoked) exacerbation.    Evaluate for sleep apnea Continue anticoagulation Following cardiology recs If need be consider sniff test  Incentive spirometry Bubble  echo.    Thanks for the referral     I have personally obtained a history, examined the patient, evaluated laboratory and imaging results, formulated the assessment and plan and placed orders.  The Patient requires high complexity decision making for assessment and support, frequent evaluation and titration of therapies, application of advanced monitoring technologies and extensive interpretation of multiple databases.   Herbon Fleming,M.D. Pulmonary & Critical care Medicine [facility]

## 2017-10-16 ENCOUNTER — Other Ambulatory Visit: Payer: Self-pay | Admitting: Hematology and Oncology

## 2017-10-16 ENCOUNTER — Inpatient Hospital Stay: Payer: Medicare Other

## 2017-10-16 ENCOUNTER — Inpatient Hospital Stay
Admit: 2017-10-16 | Discharge: 2017-10-16 | Disposition: A | Discharge status: Home / Self Care | Payer: Medicare Other | Attending: Specialist | Admitting: Specialist

## 2017-10-16 LAB — PROTIME-INR
INR: 2.19
Prothrombin Time: 24.2 seconds — ABNORMAL HIGH (ref 11.4–15.2)

## 2017-10-16 LAB — CBC
HCT: 40.5 % (ref 35.0–47.0)
HEMOGLOBIN: 12.6 g/dL (ref 12.0–16.0)
MCH: 25.6 pg — ABNORMAL LOW (ref 26.0–34.0)
MCHC: 31.1 g/dL — ABNORMAL LOW (ref 32.0–36.0)
MCV: 82.4 fL (ref 80.0–100.0)
Platelets: 666 10*3/uL — ABNORMAL HIGH (ref 150–440)
RBC: 4.91 MIL/uL (ref 3.80–5.20)
RDW: 30.4 % — AB (ref 11.5–14.5)
WBC: 6.9 10*3/uL (ref 3.6–11.0)

## 2017-10-16 NOTE — Progress Notes (Signed)
*  PRELIMINARY RESULTS* Echocardiogram 2D Echocardiogram has been performed.  Sherrie Sport 10/16/2017, 9:39 AM

## 2017-10-16 NOTE — Progress Notes (Signed)
SUBJECTIVE: No chest pain   Vitals:   10/15/17 0820 10/15/17 1724 10/15/17 2005 10/16/17 0430  BP: 118/61 121/82 124/79   Pulse: 96 88 91   Resp: (!) 28 (!) 24 18   Temp: 97.6 F (36.4 C) 97.7 F (36.5 C) 98.4 F (36.9 C)   TempSrc: Oral Oral Oral   SpO2: 95% 100% 100%   Weight:    239 lb 1.9 oz (108.5 kg)  Height:        Intake/Output Summary (Last 24 hours) at 10/16/2017 0924 Last data filed at 10/16/2017 0175 Gross per 24 hour  Intake 600 ml  Output -  Net 600 ml    LABS: Basic Metabolic Panel: Recent Labs    10/13/17 1846 10/14/17 0535  NA 137 136  K 4.3 4.2  CL 101 100*  CO2 27 25  GLUCOSE 99 92  BUN 11 9  CREATININE 0.61 0.57  CALCIUM 9.1 9.1   Liver Function Tests: Recent Labs    10/13/17 1846  AST 27  ALT 15  ALKPHOS 87  BILITOT 0.4  PROT 8.3*  ALBUMIN 3.2*   Recent Labs    10/13/17 1846  LIPASE 15   CBC: Recent Labs    10/13/17 1846  10/15/17 0313 10/16/17 0553  WBC 6.0   < > 7.5 6.9  NEUTROABS 4.4  --   --   --   HGB 14.5   < > 13.0 12.6  HCT 46.2   < > 41.1 40.5  MCV 84.1   < > 83.6 82.4  PLT 620*   < > 600* 666*   < > = values in this interval not displayed.   Cardiac Enzymes: Recent Labs    10/14/17 0119 10/14/17 0535 10/14/17 1139  TROPONINI 0.03* 0.03* 0.03*   BNP: Invalid input(s): POCBNP D-Dimer: No results for input(s): DDIMER in the last 72 hours. Hemoglobin A1C: No results for input(s): HGBA1C in the last 72 hours. Fasting Lipid Panel: No results for input(s): CHOL, HDL, LDLCALC, TRIG, CHOLHDL, LDLDIRECT in the last 72 hours. Thyroid Function Tests: No results for input(s): TSH, T4TOTAL, T3FREE, THYROIDAB in the last 72 hours.  Invalid input(s): FREET3 Anemia Panel: No results for input(s): VITAMINB12, FOLATE, FERRITIN, TIBC, IRON, RETICCTPCT in the last 72 hours.   PHYSICAL EXAM General: Well developed, well nourished, in no acute distress HEENT:  Normocephalic and atramatic Neck:  No JVD.  Lungs:  Clear bilaterally to auscultation and percussion. Heart: HRRR . Normal S1 and S2 without gallops or murmurs.  Abdomen: Bowel sounds are positive, abdomen soft and non-tender  Msk:  Back normal, normal gait. Normal strength and tone for age. Extremities: No clubbing, cyanosis or edema.   Neuro: Alert and oriented X 3. Psych:  Good affect, responds appropriately  TELEMETRY: NSR  ASSESSMENT AND PLAN: Atypical chest pain had CCTA schedualled today in office, but still here. Thus advise Tuesday f/u next week at 10 AM.  Principal Problem:   Hypoxia Active Problems:   HTN (hypertension)   Chest pain   COPD (chronic obstructive pulmonary disease) (HCC)   Chronic diastolic CHF (congestive heart failure) ([place])   Palliative care by specialist   DNR (do not resuscitate) discussion   Weakness generalized    Livy Ross A, MD, [facility] 10/16/2017 9:24 AM

## 2017-10-16 NOTE — Progress Notes (Signed)
Still awaiting replacement appliance for ostomy- patient is aware. Report given to Roselyn Reef, RN and care transferred

## 2017-10-16 NOTE — Progress Notes (Signed)
Patient ostomy wafer has a small leak, new appliance ordered for patient

## 2017-10-16 NOTE — Clinical Social Work Placement (Signed)
   CLINICAL SOCIAL WORK PLACEMENT  NOTE  Date:  10/16/2017  Patient Details  Name: Yolanda Wells MRN: [id number] Date of Birth: [birth year]  Clinical Social Work is seeking post-discharge placement for this patient at the [place] level of care (*CSW will initial, date and re-position this form in  chart as items are completed):  Yes   Patient/family provided with [facility] Work Department's list of facilities offering this level of care within the geographic area requested by the patient (or if unable, by the patient's family).  Yes   Patient/family informed of their freedom to choose among providers that offer the needed level of care, that participate in Medicare, Medicaid or managed care program needed by the patient, have an available bed and are willing to accept the patient.  Yes   Patient/family informed of [facility]'s ownership interest in [facility] and [facility], as well as of the fact that they are under no obligation to receive care at these facilities.  PASRR submitted to EDS on       PASRR number received on       Existing PASRR number confirmed on 10/15/17     FL2 transmitted to all facilities in geographic area requested by pt/family on 10/15/17     FL2 transmitted to all facilities within larger geographic area on       Patient informed that his/her managed care company has contracts with or will negotiate with certain facilities, including the following:        Yes   Patient/family informed of bed offers received.  Patient chooses bed at [facility])     Physician recommends and patient chooses bed at      Patient to be transferred to [facility] ) on 10/16/17.  Patient to be transferred to facility by [facility] )     Patient family notified on 10/16/17 of transfer.  Name of family member notified:  (Patient's grand-daughter [name] is aware of D/C today. )     PHYSICIAN       Additional  Comment:    _______________________________________________ Dulce Martian, Veronia Beets, LCSW 10/16/2017, 1:20 PM

## 2017-10-16 NOTE — Progress Notes (Signed)
ANTICOAGULATION CONSULT NOTE - Follow Up Consult  Pharmacy Consult for Warfarin Indication: VTE prophylaxis  Allergies  Allergen Reactions  . Sulfamethoxazole-Trimethoprim Nausea And Vomiting    Patient Measurements: Height: 5\' 7"  (170.2 cm) Weight: 239 lb 1.9 oz (108.5 kg) IBW/kg (Calculated) : 61.6 Heparin Dosing Weight: 82.2 kg  Vital Signs:    Labs: Recent Labs    10/13/17 1846  10/14/17 0119 10/14/17 0535 10/14/17 1139 10/15/17 0313 10/16/17 0553  HGB 14.5  --   --  14.3  --  13.0 12.6  HCT 46.2  --   --  45.5  --  41.1 40.5  PLT 620*  --   --  595*  --  600* 666*  LABPROT 19.1*   < >  --  19.6*  --  22.3* 24.2*  INR 1.62   < >  --  1.67  --  1.98 2.19  CREATININE 0.61  --   --  0.57  --   --   --   TROPONINI <0.03  --  0.03* 0.03* 0.03*  --   --    < > = values in this interval not displayed.    Estimated Creatinine Clearance: 67.6 mL/min (by C-G formula based on SCr of 0.57 mg/dL).   Medications:  Scheduled:  . aspirin EC  81 mg Oral Daily  . furosemide  20 mg Oral BID  . hydroxyurea  1,500 mg Oral Once per day on Mon Tue Wed Thu Fri  . [START ON 10/17/2017] hydroxyurea  2,000 mg Oral Once per day on Sun Sat  . mouth rinse  15 mL Mouth Rinse BID  . warfarin  4 mg Oral Once per day on Sun Tue Thu Sat  . warfarin  5 mg Oral Q M,W,F-1800  . Warfarin - Pharmacist Dosing Inpatient   Does not apply q1800    Assessment: Patient admitted for CP w/ h/o CHF and possible pulmonary hypertension on CT and negative for PE. Patient is anticoagulated with warfarin PTA for DVT on 11/23. Patient takes: Warfarin 5 mg on Mon-Wed-Fri (dose was recently increased at cardio office d/t suptherapeutic INR of 1.5) Warfarin 4 mg Tues-Thur-Sat-Sun  12/3 @ 1450 INR 1.52 12/4 @ 1846 INR 1.62 12/4 @ 2326 INR 1.72  Warfarin 4 mg (last dose taken PTA) 12/5  INR  1.67  Warfarin 6 mg 12/6  INR  1.98  Warfarin 4 mg 12/7  INR  2.19    Goal of Therapy:  INR 2-3 Monitor platelets  by anticoagulation protocol: Yes   Plan:   Will continue home warfarin regimen as above considering dose has just been increased per office visit note 10/12/17. Will monitor daily INR and every 3 day CBC. Will adjust per INR trends.  Chinita Greenland PharmD Clinical Pharmacist 10/16/2017

## 2017-10-16 NOTE — Discharge Instructions (Signed)
Follow-up with primary care physician at the facility in 2- 3 days, PCP to follow up on the PT/INR Repeat PT/INR on Monday Follow-up with the cardiology on 10/23/2017 Follow-up with pulmonology Dr. Raul Del on January 7 Follow-up with oncologist Dr. Mike Gip as scheduled

## 2017-10-16 NOTE — Progress Notes (Signed)
PT Cancellation Note  Patient Details Name: Yolanda Wells MRN: [id number] DOB: Aug 28, 1932   Cancelled Treatment:    Reason Eval/Treat Not Completed: Patient out of room at procedure or test/unavailable, will attempt to see pt at a future date/time.   Linus Salmons PT, DPT 10/16/17, 11:05 AM

## 2017-10-16 NOTE — Discharge Summary (Signed)
Yolanda Wells NAME: Cyleigh Massaro    MR#:  [id number]  DATE OF BIRTH:  03-01-1932  DATE OF ADMISSION:  10/13/2017 ADMITTING PHYSICIAN: Lance Coon, MD  DATE OF DISCHARGE: 10/16/17  PRIMARY CARE PHYSICIAN: [name], MD    ADMISSION DIAGNOSIS:  Shortness of breath [R06.02] Hypoxia [R09.02] Chest pain, unspecified type [R07.9]  DISCHARGE DIAGNOSIS:  Principal Problem:   Hypoxia Active Problems:   HTN (hypertension)   Chest pain   COPD (chronic obstructive pulmonary disease) (HCC)   Chronic diastolic CHF (congestive heart failure) ([place])   Palliative care by specialist   DNR (do not resuscitate) discussion   Weakness generalized   SECONDARY DIAGNOSIS:   Past Medical History:  Diagnosis Date  . Acute deep vein thrombosis (DVT) of distal vein of left lower extremity ([place]) 10/03/2015  . Acute pulmonary embolism ([place]) 10/03/2015  . Anticoagulant long-term use   . Arthritis   . CHF (congestive heart failure) ([place])   . Chronic anticoagulation 10/03/2015  . Collagen vascular disease ([place])   . Colostomy in place [facility]) 10/04/2015  . Deep venous thrombosis (HCC)    right lower extremity  . Dependent edema   . Diverticulosis of intestine with bleeding 04/02/2015  . Glenohumeral arthritis 06/28/2012  . Hammertoe 09/02/2012  . History of bilateral hip replacements 09/02/2012  . History of hysterectomy   . Hypertension   . Hypokalemia   . Inability to ambulate due to hip 10/12/2015  . Mandibular dysfunction   . Onychomycosis 09/02/2012  . Osteoarthritis   . Polycythemia vera ([place])   . Presence of IVC filter 05/25/2015  . Stroke ([place]) 03/26/2015   cerebellar  . Urinary incontinence in female 09/02/2016    HOSPITAL COURSE:   HPI Yolanda Wells  is a 83 y.o. female who presents with an episode of chest pain accompanied by significant shortness of breath.  Patient states that she began to have central chest  pain which then spread across both sides of her chest, and this was followed by significant shortness of breath.  When patient presented to the ED she was initially hypoxic, and O2 sats corrected on oxygen via nasal cannula.  Patient was recently admitted here for exacerbation of her heart failure.  She was felt to have pulmonary hypertension at that time as well.  CT scan today ruled out PE or pneumonia, but raise a question again for possible pulmonary hypertension.  Hospitalist were called for admission and further evaluation  * Acute hypoxic resp failure likely due to chronic lung disease possible pulmonary hypertension Resolved, off oxygen Pulmonary consult is pending Will need OP sleep study and PFTs Seen by Dr. Raul Del , outpatient follow-up as scheduled for sniff study Echocardiogram with bubble studies done, discussed with Dr.khan, he will read the report and okay to discharge patient from his standpoint Continue Lasix with potassium supplements  * Chest pain-resolved Seen by cardiology Discussed with Dr. Humphrey Rolls CCTA on12/14/18 op   *History of DVT/ pulmonary embolism On Coumadin INR 2.19  *Chronic diastolic congestive heart failure Not fluid overloaded, continue current medication  *Polycythemia vera Outpatient follow-up with oncology on 10/16/2017 for small-volume phlebotomy Seen by Dr. Mike Gip  PT eval- SNF [facility] today  Plan of care discussed with the granddaughter Ms. [name] over phone she is agreeable   DISCHARGE CONDITIONS:  fair  CONSULTS OBTAINED:  Treatment Team:  Dionisio David, MD Erby Pian, MD   PROCEDURES  None  DRUG ALLERGIES:   Allergies  Allergen Reactions  . Sulfamethoxazole-Trimethoprim Nausea And Vomiting    DISCHARGE MEDICATIONS:   Allergies as of 10/16/2017      Reactions   Sulfamethoxazole-trimethoprim Nausea And Vomiting      Medication List    TAKE these medications   amLODipine 5 MG tablet Commonly  known as:  NORVASC Take 1 tablet (5 mg total) by mouth daily.   aspirin EC 81 MG tablet Take 1 tablet (81 mg total) by mouth daily.   docusate sodium 100 MG capsule Commonly known as:  COLACE Take 100 mg by mouth 2 (two) times daily.   ENSURE Take 1 Can by mouth 3 (three) times daily with meals.   folic acid 563 MCG tablet Commonly known as:  FOLVITE Take 400 mcg by mouth daily.   furosemide 20 MG tablet Commonly known as:  LASIX Take 1 tablet (20 mg total) by mouth 2 (two) times daily.   hydroxyurea 500 MG capsule Commonly known as:  HYDREA TAKE FOUR CAPSULES BY MOUTH DAILY What changed:    how much to take  how to take this  when to take this  additional instructions   polyethylene glycol packet Commonly known as:  MIRALAX / GLYCOLAX Take 17 g by mouth daily.   potassium chloride 10 MEQ tablet Commonly known as:  K-DUR,KLOR-CON Take 10 mEq by mouth 2 (two) times a week. On Monday and Thursday  With lasix for potassium replacement   triamcinolone cream 0.1 % Commonly known as:  KENALOG Apply 1 application topically 2 (two) times daily. To affected area   warfarin 5 MG tablet Commonly known as:  COUMADIN Take 5 mg by mouth every Monday, Wednesday, and Friday. What changed:  Another medication with the same name was changed. Make sure you understand how and when to take each.   warfarin 4 MG tablet Commonly known as:  COUMADIN Take 1 tablet (4 mg total) by mouth daily at 6 PM. What changed:  when to take this            Durable Medical Equipment  (From admission, onward)        Start     Ordered   10/14/17 1309  For home use only DME oxygen  Once    Question Answer Comment  Mode or (Route) Nasal cannula   Liters per Minute 2   Frequency Continuous (stationary and portable oxygen unit needed)   Oxygen delivery system Gas      10/14/17 1308       DISCHARGE INSTRUCTIONS:   Follow-up with primary care physician at the facility in 2- 3 days,  PCP to follow up on the PT/INR Repeat PT/INR on Monday Follow-up with the cardiology on 10/23/2017 Follow-up with pulmonology Dr. Raul Del on January 7 Follow-up with oncologist Dr. Mike Gip as scheduled   DIET:  Cardiac diet  DISCHARGE CONDITION:  Stable  ACTIVITY:  Activity as tolerated per PT  OXYGEN:  Home Oxygen: No.   Oxygen Delivery: room air  DISCHARGE LOCATION:  nursing home   If you experience worsening of your admission symptoms, develop shortness of breath, life threatening emergency, suicidal or homicidal thoughts you must seek medical attention immediately by calling 911 or calling your MD immediately  if symptoms less severe.  You Must read complete instructions/literature along with all the possible adverse reactions/side effects for all the Medicines you take and that have been prescribed to you. Take any new Medicines after you have completely understood and  accpet all the possible adverse reactions/side effects.   Please note  You were cared for by a hospitalist during your hospital stay. If you have any questions about your discharge medications or the care you received while you were in the hospital after you are discharged, you can call the unit and asked to speak with the hospitalist on call if the hospitalist that took care of you is not available. Once you are discharged, your primary care physician will handle any further medical issues. Please note that NO REFILLS for any discharge medications will be authorized once you are discharged, as it is imperative that you return to your primary care physician (or establish a relationship with a primary care physician if you do not have one) for your aftercare needs so that they can reassess your need for medications and monitor your lab values.     Today  Chief Complaint  Patient presents with  . Chest Pain   Patient is feeling much better. Denies shortness of breath, poor historian  ROS: Limited review of  system as patient is a poor historian CONSTITUTIONAL: Denies fevers, chills. Denies any fatigue, weakness.  RESPIRATORY: Denies cough, wheeze, shortness of breath.  CARDIOVASCULAR: Denies chest pain, palpitations, edema.  GASTROINTESTINAL: Denies nausea, vomiting, diarrhea, abdominal pain. Denies bright red blood per rectum. HEMATOLOGIC AND LYMPHATIC: Denies easy bruising or bleeding. MUSCULOSKELETAL: Denies pain in neck, back, shoulder, knees, hips or arthritic symptoms.    VITAL SIGNS:  Blood pressure (!) 151/77, pulse 86, temperature 97.6 F (36.4 C), temperature source Oral, resp. rate 16, height 5\' 7"  (1.702 m), weight 108.5 kg (239 lb 1.9 oz), SpO2 97 %.  I/O:    Intake/Output Summary (Last 24 hours) at 10/16/2017 1245 Last data filed at 10/16/2017 1009 Gross per 24 hour  Intake 960 ml  Output -  Net 960 ml    PHYSICAL EXAMINATION:  GENERAL:  83 y.o.-year-old patient lying in the bed with no acute distress.  EYES: Pupils equal, round, reactive to light and accommodation. No scleral icterus. Extraocular muscles intact.  HEENT: Head atraumatic, normocephalic. Oropharynx and nasopharynx clear.  NECK:  Supple, no jugular venous distention. No thyroid enlargement, no tenderness.  LUNGS: Normal breath sounds bilaterally, no wheezing, rales,rhonchi or crepitation. No use of accessory muscles of respiration.  CARDIOVASCULAR: S1, S2 normal. No murmurs, rubs, or gallops.  ABDOMEN: Soft, non-tender, non-distended. Bowel sounds present. No organomegaly or mass.  EXTREMITIES: No pedal edema, cyanosis, or clubbing.  NEUROLOGIC: Patient is awake and alert and oriented 2-3 PSYCHIATRIC: The patient is alert and oriented x 2-3.  SKIN: No obvious rash, lesion, or ulcer.   DATA REVIEW:   CBC Recent Labs  Lab 10/16/17 0553  WBC 6.9  HGB 12.6  HCT 40.5  PLT 666*    Chemistries  Recent Labs  Lab 10/13/17 1846 10/14/17 0535  NA 137 136  K 4.3 4.2  CL 101 100*  CO2 27 25   GLUCOSE 99 92  BUN 11 9  CREATININE 0.61 0.57  CALCIUM 9.1 9.1  AST 27  --   ALT 15  --   ALKPHOS 87  --   BILITOT 0.4  --     Cardiac Enzymes Recent Labs  Lab 10/14/17 1139  TROPONINI 0.03*    Microbiology Results  Results for orders placed or performed during the hospital encounter of 10/06/17  Urine culture     Status: Abnormal   Collection Time: 10/06/17  7:54 AM  Result Value Ref Range Status  Specimen Description URINE, CATHETERIZED  Final   Special Requests NONE  Final   Culture 50,000 COLONIES/mL ESCHERICHIA COLI (A)  Final   Report Status 10/08/2017 FINAL  Final   Organism ID, Bacteria ESCHERICHIA COLI (A)  Final      Susceptibility   Escherichia coli - MIC*    AMPICILLIN 16 INTERMEDIATE Intermediate     CEFAZOLIN <=4 SENSITIVE Sensitive     CEFTRIAXONE <=1 SENSITIVE Sensitive     CIPROFLOXACIN >=4 RESISTANT Resistant     GENTAMICIN <=1 SENSITIVE Sensitive     IMIPENEM <=0.25 SENSITIVE Sensitive     NITROFURANTOIN <=16 SENSITIVE Sensitive     TRIMETH/SULFA <=20 SENSITIVE Sensitive     AMPICILLIN/SULBACTAM 4 SENSITIVE Sensitive     PIP/TAZO <=4 SENSITIVE Sensitive     Extended ESBL NEGATIVE Sensitive     * 50,000 COLONIES/mL ESCHERICHIA COLI    RADIOLOGY:  Dg Chest 2 View  Result Date: 10/13/2017 CLINICAL DATA:  Chest pain for several hours EXAM: CHEST  2 VIEW COMPARISON:  10/06/2017 FINDINGS: Cardiac shadow is enlarged but stable. Aortic calcifications are again seen. Elevation of the right hemidiaphragm is noted. The pulmonary artery is again prominent but stable. Lungs are well aerated bilaterally. Minimal right basilar atelectasis is seen and stable. No acute bony abnormality is noted. Significant degenerative changes of the shoulder joints are noted. IMPRESSION: Mild right basilar atelectasis.  No acute abnormality is noted. Electronically Signed   By: Inez Catalina M.D.   On: 10/13/2017 19:13   Ct Angio Chest Pe W And/or Wo Contrast  Result Date:  10/13/2017 CLINICAL DATA:  Chest pain since this morning, shortness of breath at baseline, question pulmonary embolism with high suspected pretest probability EXAM: CT ANGIOGRAPHY CHEST WITH CONTRAST TECHNIQUE: Multidetector CT imaging of the chest was performed using the standard protocol during bolus administration of intravenous contrast. Multiplanar CT image reconstructions and MIPs were obtained to evaluate the vascular anatomy. CONTRAST:  145mL ISOVUE-370 IOPAMIDOL (ISOVUE-370) INJECTION 76% COMPARISON:  11/23/2016 FINDINGS: Cardiovascular: Atherosclerotic calcifications thoracic aorta with aneurysmal dilatation of the ascending thoracic aorta 4.1 cm. Dilatation of the central pulmonary arteries, 4.7 cm transverse main pulmonary artery, 3.3 cm RIGHT pulmonary artery and 3.0 cm LEFT pulmonary artery. Pulmonary arteries adequately opacified and grossly patent. No definite evidence of pulmonary embolism. Mitral annular calcification. Mediastinum/Nodes: Nodules in both thyroid lobes including a partially calcified exophytic nodule at inferior pole of RIGHT lobe 2.5 cm greatest size unchanged. Esophagus unremarkable. Question tiny hiatal hernia. No thoracic adenopathy. Lungs/Pleura: Significant atelectasis of RIGHT lower lobe. Elevation of RIGHT diaphragm. Subsegmental atelectasis LEFT lower lobe. No acute infiltrate, pleural effusion, or pneumothorax. Upper Abdomen: Partial visualization of a large cyst upper pole RIGHT kidney 4.5 x 4.1 cm image 93. Remaining visualized upper abdomen unremarkable. Musculoskeletal: Osseous demineralization with degenerative changes of the thoracic spine. Review of the MIP images confirms the above findings. IMPRESSION: No evidence of pulmonary embolism. Marked dilatation of the central pulmonary arteries question pulmonary artery hypertension. RIGHT basilar atelectasis.  Line multinodular thyroid gland. Aneurysmal dilatation ascending thoracic aorta 4.1 cm greatest diameter,  recommendation below. Recommend annual imaging followup by CTA or MRA. This recommendation follows 2010 ACCF/AHA/AATS/ACR/ASA/SCA/SCAI/SIR/STS/SVM Guidelines for the Diagnosis and Management of Patients with Thoracic Aortic Disease. Circulation. 2010; 121: T062-I948 Aortic Atherosclerosis (ICD10-I70.0). Aortic aneurysm NOS (ICD10-I71.9). Electronically Signed   By: Lavonia Dana M.D.   On: 10/13/2017 22:04    EKG:   Orders placed or performed during the hospital encounter of 10/13/17  .  ED EKG  . ED EKG      Management plans discussed with the patient, patient's granddaughter and they are in agreement.  CODE STATUS:     Code Status Orders  (From admission, onward)        Start     Ordered   10/15/17 1141  Full code  Continuous     10/15/17 1141    Code Status History    Date Active Date Inactive Code Status Order ID Comments User Context   10/13/2017 22:46 10/15/2017 11:41 DNR [id number]  Lance Coon, MD ED   10/06/2017 14:54 10/09/2017 20:58 DNR [id number]  Nicholes Mango, MD Inpatient   10/06/2017 11:11 10/06/2017 14:53 Full Code [id number]  Nicholes Mango, MD Inpatient   05/27/2017 19:54 05/28/2017 20:13 Full Code [id number]  Vaughan Basta, MD Inpatient   05/23/2017 23:22 05/26/2017 16:14 Full Code [id number]  Lance Coon, MD Inpatient   04/10/2017 03:25 04/11/2017 19:16 Full Code [id number]  Harrie Foreman, MD Inpatient   11/23/2016 06:29 12/01/2016 19:35 Full Code [id number]  Harrie Foreman, MD Inpatient   08/26/2016 04:45 08/26/2016 19:23 Full Code [id number]  Saundra Shelling, MD Inpatient   09/22/2015 17:55 09/25/2015 18:44 Full Code [id number]  Demetrios Loll, MD ED   09/15/2015 03:41 09/17/2015 17:41 Full Code [id number]  Harrie Foreman, MD Inpatient   07/16/2015 01:51 07/20/2015 23:34 Full Code [id number]  Lance Coon, MD Inpatient   05/25/2015 08:34 05/29/2015 16:16 Full Code [id number]  Harrie Foreman, MD Inpatient   04/02/2015 06:06 04/08/2015 16:51 Full Code [id number]   Harrie Foreman, MD Inpatient    Advance Directive Documentation     Most Recent Value  Type of Advance Directive  Healthcare Power of [place], Living will  Pre-existing out of facility DNR order (yellow form or pink MOST form)  No data  "MOST" Form in Place?  No data      TOTAL TIME TAKING CARE OF THIS PATIENT: 45  minutes.   Note: This dictation was prepared with Dragon dictation along with smaller phrase technology. Any transcriptional errors that result from this process are unintentional.   @[facility] @  on 10/16/2017 at 12:45 PM  Between 7am to 6pm - Pager - [phone]  After 6pm go to [email or web address] - password [email or web address] [facility]  Office  [phone]  CC: Primary care physician; [name], MD

## 2017-10-16 NOTE — Care Management (Signed)
[facility] notified of discharge to SNF.

## 2017-10-16 NOTE — Progress Notes (Signed)
Report given to Benjamine Mola, Therapist, sports at [facility].

## 2017-10-16 NOTE — Progress Notes (Signed)
Patient is medically stable for D/C to [facility] today. Per [facility] admissions coordinator at [facility] patient can come today to room 204-B. RN will call report at [phone] and arrange EMS for transport. Clinical Education officer, museum (CSW) sent D/C orders to [employer] [facility] via [employer]. Patient is aware of above. CSW contacted patient's grand-daughter [name] and made her aware of above. Please reconsult if future social work needs arise. CSW signing off.   [employer], LCSW [phone]

## 2017-10-16 NOTE — Progress Notes (Signed)
Patient wafer and bag changed. EMS called IVs removed.

## 2017-10-19 ENCOUNTER — Other Ambulatory Visit
Admission: RE | Admit: 2017-10-19 | Discharge: 2017-10-19 | Disposition: A | Discharge status: Home / Self Care | Payer: Medicare Other | Source: Ambulatory Visit | Attending: Gerontology | Admitting: Gerontology

## 2017-10-19 ENCOUNTER — Other Ambulatory Visit: Payer: Self-pay | Admitting: Hematology and Oncology

## 2017-10-19 DIAGNOSIS — Z86711 Personal history of pulmonary embolism: Secondary | ICD-10-CM | POA: Diagnosis present

## 2017-10-19 DIAGNOSIS — D45 Polycythemia vera: Secondary | ICD-10-CM

## 2017-10-19 LAB — PROTIME-INR
INR: 2.77
Prothrombin Time: 29 seconds — ABNORMAL HIGH (ref 11.4–15.2)

## 2017-10-21 ENCOUNTER — Ambulatory Visit
Admission: RE | Admit: 2017-10-21 | Discharge: 2017-10-21 | Disposition: A | Discharge status: Home / Self Care | Payer: Medicare Other | Source: Ambulatory Visit | Attending: Internal Medicine | Admitting: Internal Medicine

## 2017-10-21 LAB — PROTIME-INR
INR: 3.35
PROTHROMBIN TIME: 33.7 s — AB (ref 11.4–15.2)

## 2017-10-22 ENCOUNTER — Other Ambulatory Visit
Admission: RE | Admit: 2017-10-22 | Discharge: 2017-10-22 | Disposition: A | Discharge status: Home / Self Care | Payer: Medicare Other | Source: Skilled Nursing Facility | Attending: Gerontology | Admitting: Gerontology

## 2017-10-22 DIAGNOSIS — Z86711 Personal history of pulmonary embolism: Secondary | ICD-10-CM | POA: Insufficient documentation

## 2017-10-22 LAB — PROTIME-INR
INR: 2.93
PROTHROMBIN TIME: 30.3 s — AB (ref 11.4–15.2)

## 2017-10-25 ENCOUNTER — Encounter: Payer: Self-pay | Admitting: Hematology and Oncology

## 2017-10-26 ENCOUNTER — Ambulatory Visit: Payer: Medicare Other | Admitting: Family

## 2017-10-26 NOTE — Progress Notes (Deleted)
   Patient ID: Yolanda Wells, female    DOB: June 15, 1932, 83 y.o.   MRN: [id number]  HPI  Yolanda Wells is a 83 y/o female with a history of  Echo report from 10/16/17 reviewed and showed an EF of 55% along with calcified mitral valve annulus.  Admitted 10/13/17 due to acute hypoxic episode along with chest pain. Cardiology consult was obtained along with palliative care consult. Discharged after 3 days to SNF for physical therapy. Admitted 10/06/17 due to HF exacerbation. Initially needed IV lasix and then transitioned to oral diuretics. Cardiology and oncology consults were obtained and she was discharged home after 3 days. Was in the ED 09/15/17 due to pulmonary HTN where medications were she was treated and released.   She presents today for her initial visit with a chief complaint of   Review of Systems    Physical Exam    Assessment & Plan:   1: Chronic heart failure with preserved ejection fraction- - NYHA class - follows with cardiologist Yolanda Wells)  2: HTN-  3: Pulmonary HTN- - has seen pulmonologist Yolanda Wells) in the past

## 2017-10-27 ENCOUNTER — Other Ambulatory Visit
Admission: RE | Admit: 2017-10-27 | Discharge: 2017-10-27 | Disposition: A | Discharge status: Home / Self Care | Payer: Medicare Other | Source: Ambulatory Visit | Attending: Gerontology | Admitting: Gerontology

## 2017-10-27 DIAGNOSIS — Z86711 Personal history of pulmonary embolism: Secondary | ICD-10-CM | POA: Diagnosis present

## 2017-10-27 LAB — PROTIME-INR
INR: 3.01
Prothrombin Time: 31 seconds — ABNORMAL HIGH (ref 11.4–15.2)

## 2017-10-30 ENCOUNTER — Other Ambulatory Visit
Admission: RE | Admit: 2017-10-30 | Discharge: 2017-10-30 | Disposition: A | Discharge status: Home / Self Care | Payer: Medicare Other | Source: Ambulatory Visit | Attending: Gerontology | Admitting: Gerontology

## 2017-10-30 ENCOUNTER — Encounter: Payer: Self-pay | Admitting: Gerontology

## 2017-10-30 ENCOUNTER — Non-Acute Institutional Stay (SKILLED_NURSING_FACILITY): Payer: Medicare Other | Admitting: Gerontology

## 2017-10-30 DIAGNOSIS — Z5181 Encounter for therapeutic drug level monitoring: Secondary | ICD-10-CM

## 2017-10-30 DIAGNOSIS — Z86711 Personal history of pulmonary embolism: Secondary | ICD-10-CM | POA: Insufficient documentation

## 2017-10-30 DIAGNOSIS — I5032 Chronic diastolic (congestive) heart failure: Secondary | ICD-10-CM | POA: Diagnosis not present

## 2017-10-30 LAB — PROTIME-INR
INR: 2.77
PROTHROMBIN TIME: 29 s — AB (ref 11.4–15.2)

## 2017-10-30 NOTE — Progress Notes (Signed)
Location:    [facility] Room Number: [phone] Place of Service:  SNF [id number])  Provider: Toni Arthurs, NP-C  PCP: [name], MD Patient Care Team: [name], MD as PCP - General (Pediatrics)  Extended Emergency Contact Information Primary Emergency Contact: [name] Address: [place],  [address] [place] of [place] Phone: [phone] Mobile Phone: [phone] Relation: Grandaughter Secondary Emergency Contact: Letta Pate [place] of [place] Phone: [phone] Relation: Son   Code Status: FULL Goals of care:  Advanced Directive information Advanced Directives 10/30/2017  Does Patient Have a Medical Advance Directive? Yes  Type of Advance Directive [place]  Does patient want to make changes to medical advance directive? No - Patient declined  Copy of [place] in Chart? Yes  Would patient like information on creating a medical advance directive? -     Allergies  Allergen Reactions  . Sulfamethoxazole-Trimethoprim Nausea And Vomiting    Chief Complaint  Patient presents with  . Discharge Note    Discharged from SNF    HPI:  83 y.o. female seen today for discharge evaluation.  Patient was admitted to the facility for rehab status post admission to [facility] for CHF exacerbation with hypoxia.  Patient has been participating in PT/OT and appears to be back at her baseline.  Medications for CHF were adjusted by facility MD 2 weeks ago.  Patient appears to be tolerating the change well.  No edema noted today.  Patient denies dyspnea/chest pain.  Patient also seen for medication monitoring of Coumadin.  Patient is on chronic Coumadin therapy for polycythemia vera.  INR today is 2.77.  No signs of bleeding or other adverse effects.  Patient is scheduled to discharge home tomorrow.  Patient is to continue following up with PCP for ongoing medication  management and monitoring patient reports she feels she is ready to go home.  Vital signs stable.  No other complaints.    Past Medical History:  Diagnosis Date  . Acute deep vein thrombosis (DVT) of distal vein of left lower extremity ([place]) 10/03/2015  . Acute pulmonary embolism ([place]) 10/03/2015  . Anticoagulant long-term use   . Arthritis   . CHF (congestive heart failure) ([place])   . Chronic anticoagulation 10/03/2015  . Collagen vascular disease ([place])   . Colostomy in place [facility]) 10/04/2015  . Deep venous thrombosis ([facility])    right lower extremity  . Dependent edema   . Diverticulosis of intestine with bleeding 04/02/2015  . Glenohumeral arthritis 06/28/2012  . Hammertoe 09/02/2012  . History of bilateral hip replacements 09/02/2012  . History of hysterectomy   . Hypertension   . Hypokalemia   . Inability to ambulate due to hip 10/12/2015  . Mandibular dysfunction   . Onychomycosis 09/02/2012  . Osteoarthritis   . Polycythemia vera ([place])   . Presence of IVC filter 05/25/2015  . Stroke ([place]) 03/26/2015   cerebellar  . Urinary incontinence in female 09/02/2016    Past Surgical History:  Procedure Laterality Date  . ABDOMINAL HYSTERECTOMY    . BLEPHAROPLASTY Right 03/2017   right upper eyelid  . CARDIAC CATHETERIZATION Right 04/03/2015   Procedure: CENTRAL LINE INSERTION;  Surgeon: Sherri Rad, MD;  Location: [facility] ORS;  Service: General;  Laterality: Right;  . COLECTOMY WITH COLOSTOMY CREATION/HARTMANN PROCEDURE N/A 04/03/2015   Procedure: COLECTOMY WITH COLOSTOMY CREATION/HARTMANN PROCEDURE;  Surgeon: Sherri Rad, MD;  Location:  [facility] ORS;  Service: General;  Laterality: N/A;  . COLON SURGERY    . FOOT AMPUTATION     partial  . FOOT AMPUTATION Right   . JOINT REPLACEMENT     Left and Right Hip  . PERIPHERAL VASCULAR CATHETERIZATION N/A 04/02/2015   Procedure: Visceral Angiography;  Surgeon: Algernon Huxley, MD;  Location: [place] [facility];  Service: Cardiovascular;   Laterality: N/A;  . PERIPHERAL VASCULAR CATHETERIZATION N/A 04/02/2015   Procedure: Visceral Artery Intervention;  Surgeon: Algernon Huxley, MD;  Location: [place] [facility];  Service: Cardiovascular;  Laterality: N/A;  . PERIPHERAL VASCULAR CATHETERIZATION N/A 05/25/2015   Procedure: IVC Filter Insertion;  Surgeon: Katha Cabal, MD;  Location: [place] [facility];  Service: Cardiovascular;  Laterality: N/A;  . TOE AMPUTATION     right  . TOTAL HIP ARTHROPLASTY        reports that  has never smoked. she has never used smokeless tobacco. She reports that she does not drink alcohol or use drugs. Social History   Socioeconomic History  . Marital status: Widowed    Spouse name: Not on file  . Number of children: 5  . Years of education: Not on file  . Highest education level: Not on file  Social Needs  . Financial resource strain: Not on file  . Food insecurity - worry: Not on file  . Food insecurity - inability: Not on file  . Transportation needs - medical: Not on file  . Transportation needs - non-medical: Not on file  Occupational History  . Occupation: retired  Tobacco Use  . Smoking status: Never Smoker  . Smokeless tobacco: Never Used  Substance and Sexual Activity  . Alcohol use: No  . Drug use: No  . Sexual activity: No  Other Topics Concern  . Not on file  Social History Narrative   Lives at [place]:    Allergies  Allergen Reactions  . Sulfamethoxazole-Trimethoprim Nausea And Vomiting    Pertinent  Health Maintenance Due  Topic Date Due  . DEXA SCAN  03/10/1999  . INFLUENZA VACCINE  Completed  . PNA vac Low Risk Adult  Completed    Medications: Allergies as of 10/30/2017      Reactions   Sulfamethoxazole-trimethoprim Nausea And Vomiting      Medication List        Accurate as of 10/30/17 10:30 AM. Always use your most recent med list.          amLODipine 5 MG tablet Commonly known as:  NORVASC Take 5 mg by  mouth daily.   aspirin EC 81 MG tablet Take 1 tablet (81 mg total) by mouth daily.   docusate sodium 100 MG capsule Commonly known as:  COLACE Take 100 mg by mouth 2 (two) times daily.   ENSURE Take 1 Can by mouth 3 (three) times daily with meals.   folic acid 767 MCG tablet Commonly known as:  FOLVITE Take 400 mcg by mouth daily.   hydroxyurea 500 MG capsule Commonly known as:  HYDREA TAKE FOUR CAPSULES BY MOUTH DAILY   ipratropium-albuterol 0.5-2.5 (3) MG/3ML Soln Commonly known as:  DUONEB Take 3 mLs by nebulization every 4 (four) hours as needed.   LORazepam 0.5 MG tablet Commonly known as:  ATIVAN Take 0.5 mg by mouth every 4 (four) hours as needed for anxiety.   polyethylene glycol packet Commonly known as:  MIRALAX / GLYCOLAX Take 17 g by mouth  daily.   potassium chloride 10 MEQ tablet Commonly known as:  K-DUR,KLOR-CON Take 10 mEq by mouth 2 (two) times a week. On Monday and Thursday  With lasix for potassium replacement   torsemide 10 MG tablet Commonly known as:  DEMADEX Take 10 mg by mouth daily.   triamcinolone cream 0.1 % Commonly known as:  KENALOG Apply 1 application topically 2 (two) times daily. To affected area   warfarin 5 MG tablet Commonly known as:  COUMADIN Take 5 mg by mouth every Monday, Wednesday, and Friday.       Review of Systems  Constitutional: Negative for activity change, appetite change, chills, diaphoresis and fever.  HENT: Negative for congestion, mouth sores, nosebleeds, postnasal drip, sneezing, sore throat, trouble swallowing and voice change.   Respiratory: Negative for apnea, cough, choking, chest tightness, shortness of breath and wheezing.   Cardiovascular: Negative for chest pain, palpitations and leg swelling.  Gastrointestinal: Negative for abdominal distention, abdominal pain, constipation, diarrhea and nausea.  Genitourinary: Negative for difficulty urinating, dysuria, frequency and urgency.  Musculoskeletal:  Negative for back pain, gait problem and myalgias. Arthralgias: typical arthritis.  Skin: Negative for color change, pallor, rash and wound.  Neurological: Positive for weakness. Negative for dizziness, tremors, syncope, speech difficulty, numbness and headaches.  Psychiatric/Behavioral: Negative for agitation and behavioral problems.  All other systems reviewed and are negative.   Vitals:   10/30/17 0949  BP: 125/63  Pulse: 86  Resp: 19  Temp: 97.7 F (36.5 C)  TempSrc: Oral  SpO2: 96%  Weight: 200 lb 14.4 oz (91.1 kg)  Height: 5\' 7"  (1.702 m)   Body mass index is 31.47 kg/m. Physical Exam  Constitutional: She is oriented to person, place, and time. Vital signs are normal. She appears well-developed and well-nourished. She is active and cooperative. She does not appear ill. No distress.  HENT:  Head: Normocephalic and atraumatic.  Mouth/Throat: Uvula is midline, oropharynx is clear and moist and mucous membranes are normal. Mucous membranes are not pale, not dry and not cyanotic.  Eyes: Conjunctivae, EOM and lids are normal. Pupils are equal, round, and reactive to light.  Neck: Trachea normal, normal range of motion and full passive range of motion without pain. Neck supple. No JVD present. No tracheal deviation, no edema and no erythema present. No thyromegaly present.  Cardiovascular: Normal rate, regular rhythm, normal heart sounds, intact distal pulses and normal pulses. Exam reveals no gallop, no distant heart sounds and no friction rub.  No murmur heard. Pulses:      Dorsalis pedis pulses are 2+ on the right side, and 2+ on the left side.  Minimal edema  Pulmonary/Chest: Effort normal and breath sounds normal. No accessory muscle usage. No respiratory distress. She has no decreased breath sounds. She has no wheezes. She has no rhonchi. She has no rales. She exhibits no tenderness.  Abdominal: Soft. Normal appearance and bowel sounds are normal. She exhibits no distension and  no ascites. There is no tenderness.  Colostomy in place  Musculoskeletal: Normal range of motion. She exhibits no edema or tenderness.  Expected osteoarthritis, stiffness; Bilateral Calves soft, supple. Negative Homan's Sign. B- pedal pulses equal; generalized weakness and deconditioning  Neurological: She is alert and oriented to person, place, and time. She has normal strength. Coordination and gait abnormal.  Skin: Skin is warm, dry and intact. She is not diaphoretic. No cyanosis. No pallor. Nails show no clubbing.  Psychiatric: She has a normal mood and affect. Her speech is  normal and behavior is normal. Judgment and thought content normal. Cognition and memory are normal.  Nursing note and vitals reviewed.   Labs reviewed: Basic Metabolic Panel: Recent Labs    11/25/16 0420  07/18/17 1300  10/09/17 0613 10/13/17 1846 10/14/17 0535  NA 139   < > 136   < > 136 137 136  K 4.4   < > 4.5   < > 3.3* 4.3 4.2  CL 107   < > 104   < > 99* 101 100*  CO2 25   < > 27   < > 27 27 25   GLUCOSE 95   < > 103*   < > 101* 99 92  BUN 9   < > 8   < > 19 11 9   CREATININE 0.41*   < > 0.49   < > 0.57 0.61 0.57  CALCIUM 8.3*   < > 8.2*   < > 8.7* 9.1 9.1  MG 1.8  --  1.9  --   --   --   --    < > = values in this interval not displayed.   Liver Function Tests: Recent Labs    08/13/17 1446 09/11/17 1309 10/13/17 1846  AST 17 22 27   ALT 7* 10* 15  ALKPHOS 72 80 87  BILITOT 0.7 0.6 0.4  PROT 7.6 7.7 8.3*  ALBUMIN 2.8* 3.0* 3.2*   Recent Labs    04/09/17 2140 10/13/17 1846  LIPASE 17 15   No results for input(s): AMMONIA in the last 8760 hours. CBC: Recent Labs    10/09/17 0613 10/12/17 1450 10/13/17 1846 10/14/17 0535 10/15/17 0313 10/16/17 0553  WBC 2.0* 3.5* 6.0 6.5 7.5 6.9  NEUTROABS 0.8* 2.2 4.4  --   --   --   HGB 12.3 13.5 14.5 14.3 13.0 12.6  HCT 38.9 42.4 46.2 45.5 41.1 40.5  MCV 82.1 82.8 84.1 84.0 83.6 82.4  PLT 580* 566* 620* 595* 600* 666*   Cardiac  Enzymes: Recent Labs    10/14/17 0119 10/14/17 0535 10/14/17 1139  TROPONINI 0.03* 0.03* 0.03*   BNP: Invalid input(s): POCBNP CBG: No results for input(s): GLUCAP in the last 8760 hours.  Procedures and Imaging Studies During Stay: Dg Chest 2 View  Result Date: 10/13/2017 CLINICAL DATA:  Chest pain for several hours EXAM: CHEST  2 VIEW COMPARISON:  10/06/2017 FINDINGS: Cardiac shadow is enlarged but stable. Aortic calcifications are again seen. Elevation of the right hemidiaphragm is noted. The pulmonary artery is again prominent but stable. Lungs are well aerated bilaterally. Minimal right basilar atelectasis is seen and stable. No acute bony abnormality is noted. Significant degenerative changes of the shoulder joints are noted. IMPRESSION: Mild right basilar atelectasis.  No acute abnormality is noted. Electronically Signed   By: Inez Catalina M.D.   On: 10/13/2017 19:13   Dg Chest 2 View  Result Date: 10/06/2017 CLINICAL DATA:  Short of breath EXAM: CHEST  2 VIEW COMPARISON:  05/27/2017 FINDINGS: Pulmonary artery enlargement is chronic and unchanged. Negative for heart failure or edema. Negative for pleural effusion. Mild bibasilar atelectasis. No significant change from the prior study Advanced degenerative change in both shoulder joints. IMPRESSION: Pulmonary artery hypertension without heart failure Bibasilar atelectasis Electronically Signed   By: Franchot Gallo M.D.   On: 10/06/2017 07:21   Ct Angio Chest Pe W And/or Wo Contrast  Result Date: 10/13/2017 CLINICAL DATA:  Chest pain since this morning, shortness of breath at baseline, question pulmonary  embolism with high suspected pretest probability EXAM: CT ANGIOGRAPHY CHEST WITH CONTRAST TECHNIQUE: Multidetector CT imaging of the chest was performed using the standard protocol during bolus administration of intravenous contrast. Multiplanar CT image reconstructions and MIPs were obtained to evaluate the vascular anatomy. CONTRAST:   162mL ISOVUE-370 IOPAMIDOL (ISOVUE-370) INJECTION 76% COMPARISON:  11/23/2016 FINDINGS: Cardiovascular: Atherosclerotic calcifications thoracic aorta with aneurysmal dilatation of the ascending thoracic aorta 4.1 cm. Dilatation of the central pulmonary arteries, 4.7 cm transverse main pulmonary artery, 3.3 cm RIGHT pulmonary artery and 3.0 cm LEFT pulmonary artery. Pulmonary arteries adequately opacified and grossly patent. No definite evidence of pulmonary embolism. Mitral annular calcification. Mediastinum/Nodes: Nodules in both thyroid lobes including a partially calcified exophytic nodule at inferior pole of RIGHT lobe 2.5 cm greatest size unchanged. Esophagus unremarkable. Question tiny hiatal hernia. No thoracic adenopathy. Lungs/Pleura: Significant atelectasis of RIGHT lower lobe. Elevation of RIGHT diaphragm. Subsegmental atelectasis LEFT lower lobe. No acute infiltrate, pleural effusion, or pneumothorax. Upper Abdomen: Partial visualization of a large cyst upper pole RIGHT kidney 4.5 x 4.1 cm image 93. Remaining visualized upper abdomen unremarkable. Musculoskeletal: Osseous demineralization with degenerative changes of the thoracic spine. Review of the MIP images confirms the above findings. IMPRESSION: No evidence of pulmonary embolism. Marked dilatation of the central pulmonary arteries question pulmonary artery hypertension. RIGHT basilar atelectasis.  Line multinodular thyroid gland. Aneurysmal dilatation ascending thoracic aorta 4.1 cm greatest diameter, recommendation below. Recommend annual imaging followup by CTA or MRA. This recommendation follows 2010 ACCF/AHA/AATS/ACR/ASA/SCA/SCAI/SIR/STS/SVM Guidelines for the Diagnosis and Management of Patients with Thoracic Aortic Disease. Circulation. 2010; 121: A834-H962 Aortic Atherosclerosis (ICD10-I70.0). Aortic aneurysm NOS (ICD10-I71.9). Electronically Signed   By: Lavonia Dana M.D.   On: 10/13/2017 22:04    Assessment/Plan:   1.  Chronic diastolic  CHF (congestive heart failure)  Continue torsemide 10 mg p.o. daily  Continue potassium chloride 10 mEq p.o. twice weekly  Follow-up with PCP/cardiologist ASAP after discharge for medication management and continuity of care  2.  Encounter for therapeutic drug monitoring  Continue Coumadin 5 mg p.o. q. Monday, Wednesday, Friday  Continue Coumadin 3 mg p.o. q. Tuesday, Thursday, Saturday, Sunday  Recheck INR within 1 week  Follow-up with PCP/cardiologist ASAP after discharge for medication management and continuity of care   Patient is being discharged with the following home health services: Home health PT/OT/nursing from [facility]  Patient is being discharged with the following durable medical equipment: None  Patient has been advised to f/u with their PCP in 1-2 weeks to bring them up to date on their rehab stay.  Social services at facility was responsible for arranging this appointment.  Pt was provided with a 30 day supply of prescriptions for medications and refills must be obtained from their PCP.  For controlled substances, a more limited supply may be provided adequate until PCP appointment only.  Future labs/tests needed: PT/INR within 1 week  Family/ staff Communication:   Total Time:  Documentation:  Face to Face:  Family/Phone:  Vikki Ports, NP-C [facility] [address], [place] [address] Cell Phone (Mon-Fri 8am-5pm):  [phone] On Call:  [phone] & follow prompts after 5pm & weekends Office Phone:  [phone] Office Fax:  [phone]

## 2017-11-07 ENCOUNTER — Emergency Department
Admission: EM | Admit: 2017-11-07 | Discharge: 2017-11-08 | Disposition: A | Discharge status: Home / Self Care | Payer: Medicare Other | Attending: Emergency Medicine | Admitting: Emergency Medicine

## 2017-11-07 ENCOUNTER — Emergency Department: Payer: Medicare Other

## 2017-11-07 ENCOUNTER — Other Ambulatory Visit: Payer: Self-pay

## 2017-11-07 ENCOUNTER — Encounter: Payer: Self-pay | Admitting: Emergency Medicine

## 2017-11-07 DIAGNOSIS — I110 Hypertensive heart disease with heart failure: Secondary | ICD-10-CM | POA: Diagnosis not present

## 2017-11-07 DIAGNOSIS — I5032 Chronic diastolic (congestive) heart failure: Secondary | ICD-10-CM | POA: Insufficient documentation

## 2017-11-07 DIAGNOSIS — R0602 Shortness of breath: Secondary | ICD-10-CM

## 2017-11-07 DIAGNOSIS — Z7901 Long term (current) use of anticoagulants: Secondary | ICD-10-CM | POA: Diagnosis not present

## 2017-11-07 DIAGNOSIS — Z96643 Presence of artificial hip joint, bilateral: Secondary | ICD-10-CM | POA: Diagnosis not present

## 2017-11-07 DIAGNOSIS — J449 Chronic obstructive pulmonary disease, unspecified: Secondary | ICD-10-CM | POA: Diagnosis not present

## 2017-11-07 DIAGNOSIS — Z7982 Long term (current) use of aspirin: Secondary | ICD-10-CM | POA: Diagnosis not present

## 2017-11-07 DIAGNOSIS — Z8673 Personal history of transient ischemic attack (TIA), and cerebral infarction without residual deficits: Secondary | ICD-10-CM | POA: Insufficient documentation

## 2017-11-07 LAB — BASIC METABOLIC PANEL
ANION GAP: 6 (ref 5–15)
BUN: 12 mg/dL (ref 6–20)
CALCIUM: 8.7 mg/dL — AB (ref 8.9–10.3)
CO2: 26 mmol/L (ref 22–32)
CREATININE: 0.5 mg/dL (ref 0.44–1.00)
Chloride: 107 mmol/L (ref 101–111)
Glucose, Bld: 100 mg/dL — ABNORMAL HIGH (ref 65–99)
Potassium: 4.1 mmol/L (ref 3.5–5.1)
SODIUM: 139 mmol/L (ref 135–145)

## 2017-11-07 LAB — PROTIME-INR
INR: 3.33
PROTHROMBIN TIME: 33.5 s — AB (ref 11.4–15.2)

## 2017-11-07 LAB — TROPONIN I: Troponin I: <0.03 ng/mL (ref <0.03)

## 2017-11-07 NOTE — ED Notes (Signed)
Pt has hx of CHF; per EMS lasix increased 12/25. Pt denies shortness of breath, chest pain, dizziness, nausea, vomiting.

## 2017-11-07 NOTE — ED Notes (Signed)
This RN spoke with [name], pt's granddaughter. Pt insisted granddaughter not come to hospital d/t having to work in the morning. Granddaughter updated with plan of care for pt. Pt will need EMS transport back home.

## 2017-11-07 NOTE — ED Triage Notes (Signed)
Pt arrives via [facility] from home with c/o shortness of breath that began around 5pm this evening. Pt called daughter scared. Daughter is Presenter, broadcasting and believes pt just had extra anxiety. Pt denies any shortness of breath to this RN at this time. Pt has colostomy bag; has clot in left ankle, per EMS.

## 2017-11-08 ENCOUNTER — Encounter: Payer: Self-pay | Admitting: Emergency Medicine

## 2017-11-08 LAB — CBC WITH DIFFERENTIAL/PLATELET
BASOS PCT: 1 %
Basophils Absolute: 0.1 10*3/uL (ref 0–0.1)
Eosinophils Absolute: 0.1 10*3/uL (ref 0–0.7)
Eosinophils Relative: 3 %
HEMATOCRIT: 39 % (ref 35.0–47.0)
HEMOGLOBIN: 12.6 g/dL (ref 12.0–16.0)
LYMPHS ABS: 1.2 10*3/uL (ref 1.0–3.6)
LYMPHS PCT: 27 %
MCH: 26.7 pg (ref 26.0–34.0)
MCHC: 32.4 g/dL (ref 32.0–36.0)
MCV: 82.6 fL (ref 80.0–100.0)
MONO ABS: 0.2 10*3/uL (ref 0.2–0.9)
MONOS PCT: 5 %
NEUTROS ABS: 3 10*3/uL (ref 1.4–6.5)
NEUTROS PCT: 64 %
Platelets: 944 10*3/uL (ref 150–440)
RBC: 4.73 MIL/uL (ref 3.80–5.20)
RDW: 32.9 % — ABNORMAL HIGH (ref 11.5–14.5)
WBC: 4.7 10*3/uL (ref 3.6–11.0)

## 2017-11-08 LAB — BRAIN NATRIURETIC PEPTIDE: B NATRIURETIC PEPTIDE 5: 55 pg/mL (ref 0.0–100.0)

## 2017-11-08 NOTE — ED Provider Notes (Signed)
[facility] Emergency Department Provider Note ___   First MD Initiated Contact with Patient 11/07/17 2033     (approximate)  I have reviewed the triage vital signs and the nursing notes.   HISTORY  Chief Complaint Shortness of Breath   HPI Yolanda Wells is a 83 y.o. female with below list of chronic medical conditions including DVT PE polycythemia vera presents emergency department with dyspnea which is resolved.  Patient states that she gets short of breath "every night".  Patient denies any symptoms at present including chest pain shortness of breath lower externally pain or swelling.   Past Medical History:  Diagnosis Date  . Acute deep vein thrombosis (DVT) of distal vein of left lower extremity ([place]) 10/03/2015  . Acute pulmonary embolism ([place]) 10/03/2015  . Anticoagulant long-term use   . Arthritis   . CHF (congestive heart failure) ([place])   . Chronic anticoagulation 10/03/2015  . Collagen vascular disease ([facility])   . Colostomy in place [facility]) 10/04/2015  . Deep venous thrombosis (H[facility])    right lower extremity  . Dependent edema   . Diverticulosis of intestine with bleeding 04/02/2015  . Glenohumeral arthritis 06/28/2012  . Hammertoe 09/02/2012  . History of bilateral hip replacements 09/02/2012  . History of hysterectomy   . Hypertension   . Hypokalemia   . Inability to ambulate due to hip 10/12/2015  . Mandibular dysfunction   . Onychomycosis 09/02/2012  . Osteoarthritis   . Polycythemia vera ([place])   . Presence of IVC filter 05/25/2015  . 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 Urinary incontinence in female 09/02/2016    Patient Active Problem List   Diagnosis Date Noted  . Palliative care by specialist   . DNR (do not resuscitate) discussion   . Weakness generalized   . Hypoxia 10/13/2017  . Acute CHF (congestive heart failure) ([place]) 10/06/2017  . Chronic diastolic CHF (congestive heart failure) ([place]) 07/28/2017  . Dyspnea  05/27/2017  . Ileus ([place]) 04/10/2017  . Influenza with respiratory manifestation 12/01/2016  . Respiratory distress 11/23/2016  . Urinary incontinence in female 09/02/2016  . Vision loss of right eye 08/26/2016  . Blindness of right eye 08/26/2016  . Inability to ambulate due to hip 10/12/2015  . Colostomy in place [facility]) 10/04/2015  . Chronic anticoagulation 10/03/2015  . Acute pulmonary embolism ([place]) 10/03/2015  . Leukocytosis 09/25/2015  . Chest pain 09/25/2015  . COPD exacerbation ([place]) 09/24/2015  . CAP (community acquired pneumonia) 09/24/2015  . COPD (chronic obstructive pulmonary disease) ([place]) 09/24/2015  . SOB (shortness of breath) 09/22/2015  . Elevated troponin 09/15/2015  . UTI (urinary tract infection) 07/16/2015  . Abdominal wall cellulitis 07/15/2015  . DVT (deep venous thrombosis) ([place]) 07/15/2015  . HTN (hypertension) 07/15/2015  . Polycythemia vera ([place]) 07/15/2015  . Dependent edema 07/15/2015  . Arthritis 07/15/2015  . Pulmonary emboli ([place]) 05/25/2015  . Diverticulosis of colon with hemorrhage 04/02/2015  . Diverticulosis of intestine with bleeding 04/02/2015  . Dehydration, moderate 01/11/2015  . Fall 01/10/2015  . Osteoarthritis 01/10/2015  . Hammertoe 09/02/2012  . S/P transmetatarsal amputation of foot ([place]) 09/02/2012  . Onychomycosis 09/02/2012  . Other specified dermatoses 09/02/2012  . Hip pain 08/23/2012  . S/P hip replacement 08/23/2012  . Glenohumeral arthritis 06/28/2012    Past Surgical History:  Procedure Laterality Date  . ABDOMINAL HYSTERECTOMY    . BLEPHAROPLASTY Right 03/2017   right upper eyelid  . CARDIAC CATHETERIZATION Right 04/03/2015   Procedure: CENTRAL  LINE INSERTION;  Surgeon: Sherri Rad, MD;  Location: [facility] ORS;  Service: General;  Laterality: Right;  . COLECTOMY WITH COLOSTOMY CREATION/HARTMANN PROCEDURE N/A 04/03/2015   Procedure: COLECTOMY WITH COLOSTOMY CREATION/HARTMANN PROCEDURE;  Surgeon: Sherri Rad, MD;  Location:  [facility] ORS;  Service: General;  Laterality: N/A;  . COLON SURGERY    . FOOT AMPUTATION     partial  . FOOT AMPUTATION Right   . JOINT REPLACEMENT     Left and Right Hip  . PERIPHERAL VASCULAR CATHETERIZATION N/A 04/02/2015   Procedure: Visceral Angiography;  Surgeon: Algernon Huxley, MD;  Location: [place] [facility];  Service: Cardiovascular;  Laterality: N/A;  . PERIPHERAL VASCULAR CATHETERIZATION N/A 04/02/2015   Procedure: Visceral Artery Intervention;  Surgeon: Algernon Huxley, MD;  Location: [place] [facility];  Service: Cardiovascular;  Laterality: N/A;  . PERIPHERAL VASCULAR CATHETERIZATION N/A 05/25/2015   Procedure: IVC Filter Insertion;  Surgeon: Katha Cabal, MD;  Location: [place] [facility];  Service: Cardiovascular;  Laterality: N/A;  . TOE AMPUTATION     right  . TOTAL HIP ARTHROPLASTY      Prior to Admission medications   Medication Sig Start Date End Date Taking? Authorizing Provider  amLODipine (NORVASC) 5 MG tablet Take 5 mg by mouth daily.    [provider]  aspirin EC 81 MG tablet Take 1 tablet (81 mg total) by mouth daily. 09/16/15   [name], MD  docusate sodium (COLACE) 100 MG capsule Take 100 mg by mouth 2 (two) times daily.     [provider]  ENSURE (ENSURE) Take 1 Can by mouth 3 (three) times daily with meals.    [provider]  folic acid (FOLVITE) 081 MCG tablet Take 400 mcg by mouth daily.    [provider]  hydroxyurea (HYDREA) 500 MG capsule TAKE FOUR CAPSULES BY MOUTH DAILY 10/19/17   Nolon Stalls C, MD  ipratropium-albuterol (DUONEB) 0.5-2.5 (3) MG/3ML SOLN Take 3 mLs by nebulization every 4 (four) hours as needed.    [provider]  LORazepam (ATIVAN) 0.5 MG tablet Take 0.5 mg by mouth every 4 (four) hours as needed for anxiety.    [provider]  polyethylene glycol (MIRALAX / GLYCOLAX) packet Take 17 g by mouth daily.    [provider]  potassium chloride  (K-DUR,KLOR-CON) 10 MEQ tablet Take 10 mEq by mouth 2 (two) times a week. On Monday and Thursday  With lasix for potassium replacement    [provider]  torsemide (DEMADEX) 10 MG tablet Take 10 mg by mouth daily.    [provider]  triamcinolone cream (KENALOG) 0.1 % Apply 1 application topically 2 (two) times daily. To affected area    [provider]  warfarin (COUMADIN) 5 MG tablet Take 5 mg by mouth every Monday, Wednesday, and Friday.     [provider]    Allergies Sulfamethoxazole-trimethoprim  Family History  Problem Relation Age of Onset  . Brain cancer Mother   . Heart attack Father   . COPD Father   . Coronary artery disease Father   . Hypertension Unknown   . Arthritis-Osteo Unknown   . Diabetes Brother   . Arthritis Brother   . Osteosarcoma Son   . Bone cancer Son   . Arthritis Sister   . Arthritis Brother   . Hypertension Brother   . Coronary artery disease Brother     Social History Social History   Tobacco Use  . Smoking  status: Never Smoker  . Smokeless tobacco: Never Used  Substance Use Topics  . Alcohol use: No  . Drug use: No    Review of Systems Constitutional: No fever/chills Eyes: No visual changes. ENT: No sore throat. Cardiovascular: Denies chest pain. Respiratory: Positive for dyspnea (now resolved) Gastrointestinal: No abdominal pain.  No nausea, no vomiting.  No diarrhea.  No constipation. Genitourinary: Negative for dysuria. Musculoskeletal: Negative for neck pain.  Negative for back pain. Integumentary: Negative for rash. Neurological: Negative for headaches, focal weakness or numbness.   ____________________________________________   PHYSICAL EXAM:  VITAL SIGNS: ED Triage Vitals  Enc Vitals Group     BP 11/07/17 2034 113/86     Pulse Rate 11/07/17 2034 72     Resp 11/07/17 2034 18     Temp 11/07/17 2034 97.8 F (36.6 C)     Temp Source 11/07/17 2034 Oral     SpO2 11/07/17 2034 98 %       Weight 11/07/17 2036 88.5 kg (195 lb)     Height 11/07/17 2036 1.702 m (5\' 7" )     Head Circumference --      Peak Flow --      Pain Score 11/07/17 2230 Asleep     Pain Loc --      Pain Edu? --      Excl. in [place]? --     Constitutional: Alert and oriented. Well appearing and in no acute distress. Eyes: Conjunctivae are normal.  Head: Atraumatic. Mouth/Throat: Mucous membranes are moist.  Oropharynx non-erythematous. Neck: No stridor.   Cardiovascular: Normal rate, regular rhythm. Good peripheral circulation. Grossly normal heart sounds. Respiratory: Normal respiratory effort.  No retractions. Lungs CTAB. Gastrointestinal: Soft and nontender. No distention.  Musculoskeletal: No lower extremity tenderness nor edema. No gross deformities of extremities. Neurologic:  Normal speech and language. No gross focal neurologic deficits are appreciated.  Skin:  Skin is warm, dry and intact. No rash noted. Psychiatric: Mood and affect are normal. Speech and behavior are normal.  ____________________________________________   LABS (all labs ordered are listed, but only abnormal results are displayed)  Labs Reviewed  PROTIME-INR - Abnormal; Notable for the following components:      Result Value   Prothrombin Time 33.5 (*)    All other components within normal limits  CBC WITH DIFFERENTIAL/PLATELET - Abnormal; Notable for the following components:   RDW 32.9 (*)    Platelets 944 (*)    All other components within normal limits  BASIC METABOLIC PANEL - Abnormal; Notable for the following components:   Glucose, Bld 100 (*)    Calcium 8.7 (*)    All other components within normal limits  BRAIN NATRIURETIC PEPTIDE  TROPONIN I   ____________________________________________  EKG  ED ECG REPORT I, Darby N Shakeria Robinette, the attending physician, personally viewed and interpreted this ECG.   Date: 11/08/2017  EKG Time: 8:36 PM  Rate: 75  Rhythm: Normal sinus rhythm  Axis: Normal   Intervals: Normal  ST&T Change:   ____________________________________________  RADIOLOGY I, [place] Ernst Bowler, personally viewed and evaluated these images (plain radiographs) as part of my medical decision making, as well as reviewing the written report by the radiologist.  Dg Chest Port 1 View  Result Date: 11/07/2017 CLINICAL DATA:  Acute onset of shortness of breath. EXAM: PORTABLE CHEST 1 VIEW COMPARISON:  Chest radiograph and CTA of the chest performed 10/13/2017 FINDINGS: There is mild elevation of the right hemidiaphragm, with associated atelectasis. There is no evidence  of pleural effusion or pneumothorax. The cardiomediastinal silhouette is mildly enlarged. Calcification is again noted at the mitral valve. No acute osseous abnormalities are seen. Degenerative change is noted at the glenohumeral joints bilaterally. IMPRESSION: Mild elevation of the right hemidiaphragm, with associated atelectasis. Mild cardiomegaly. Electronically Signed   By: Garald Balding M.D.   On: 11/07/2017 23:09      Procedures   ____________________________________________   INITIAL IMPRESSION / ASSESSMENT AND PLAN / ED COURSE  As part of my medical decision making, I reviewed the following data within the electronic MEDICAL RECORD [id number] year old female present with above-stated history and physical exam of dyspnea which resolved before arrival to the emergency department.  Patient had no complaints while in the emergency department.  EKG revealed no evidence of ischemia or infarction.  Laboratory data normal exception of platelet count of 944 in a patient with known polycythemia vera ____________________________________________  FINAL CLINICAL IMPRESSION(S) / ED DIAGNOSES  Final diagnoses:  Shortness of breath     MEDICATIONS GIVEN DURING THIS VISIT:  Medications - No data to display   ED Discharge Orders    None       Note:  This document was prepared using Dragon voice recognition  software and may include unintentional dictation errors.    Gregor Hams, MD 11/08/17 [id number]

## 2017-11-08 NOTE — ED Notes (Signed)
This RN spoke with pt granddaughter who called to check up on her. Informed granddaughter that pt is stable, that MD will be working on paperwork for discharge and that we will call for EMS transport when possible.

## 2017-11-11 ENCOUNTER — Telehealth: Payer: Self-pay | Admitting: *Deleted

## 2017-11-11 NOTE — Telephone Encounter (Signed)
  Labs were drawn 4 days ago.  I would check INR today.  INR likely different and dose adjustments are made on today's labs.  M

## 2017-11-11 NOTE — Telephone Encounter (Signed)
VO called to [facility] [phone] for physical therapy INR check, they will try to go today, if not today, then tomorrow. They will call results to [place]

## 2017-11-11 NOTE — Telephone Encounter (Signed)
[name] called to report that patient had to go to ER over weekend for shortness of breath and her INR was high at 3.3, she is also having increased bruising. She is asking if her coumadin dose should be reduced. Please advise

## 2017-11-12 ENCOUNTER — Telehealth: Payer: Self-pay | Admitting: *Deleted

## 2017-11-12 NOTE — Telephone Encounter (Signed)
INR today is 2.3. She has large bruises across her lower abd

## 2017-11-13 NOTE — Telephone Encounter (Signed)
VO called to [facility] with home health, she stated no problem, she will recheck her next week

## 2017-11-13 NOTE — Telephone Encounter (Signed)
  INR is therapeutic.  Continue current dose of Coumadin.  M

## 2017-11-13 NOTE — Telephone Encounter (Signed)
Informed of no change in dosing as INR was therapeutic. She states she had held patient coumadin dose for 2 day prior to labs check

## 2017-11-13 NOTE — Telephone Encounter (Signed)
Left message for Yolanda Wells on her voice mail as well

## 2017-11-13 NOTE — Telephone Encounter (Signed)
  Recheck INR next week.  M

## 2017-11-23 ENCOUNTER — Inpatient Hospital Stay: Payer: Medicare Other | Admitting: Hematology and Oncology

## 2017-11-23 ENCOUNTER — Emergency Department
Admission: EM | Admit: 2017-11-23 | Discharge: 2017-11-23 | Disposition: A | Discharge status: Home / Self Care | Payer: Medicare Other | Attending: Student in an Organized Health Care Education/Training Program | Admitting: Student in an Organized Health Care Education/Training Program

## 2017-11-23 ENCOUNTER — Emergency Department: Payer: Medicare Other

## 2017-11-23 ENCOUNTER — Inpatient Hospital Stay: Payer: Medicare Other

## 2017-11-23 ENCOUNTER — Encounter: Payer: Self-pay | Admitting: Emergency Medicine

## 2017-11-23 ENCOUNTER — Other Ambulatory Visit: Payer: Self-pay

## 2017-11-23 DIAGNOSIS — Z79899 Other long term (current) drug therapy: Secondary | ICD-10-CM | POA: Insufficient documentation

## 2017-11-23 DIAGNOSIS — I5032 Chronic diastolic (congestive) heart failure: Secondary | ICD-10-CM | POA: Diagnosis not present

## 2017-11-23 DIAGNOSIS — I110 Hypertensive heart disease with heart failure: Secondary | ICD-10-CM | POA: Diagnosis not present

## 2017-11-23 DIAGNOSIS — N3001 Acute cystitis with hematuria: Secondary | ICD-10-CM | POA: Diagnosis not present

## 2017-11-23 DIAGNOSIS — R0602 Shortness of breath: Secondary | ICD-10-CM | POA: Diagnosis not present

## 2017-11-23 DIAGNOSIS — J449 Chronic obstructive pulmonary disease, unspecified: Secondary | ICD-10-CM | POA: Insufficient documentation

## 2017-11-23 DIAGNOSIS — Z7982 Long term (current) use of aspirin: Secondary | ICD-10-CM | POA: Insufficient documentation

## 2017-11-23 DIAGNOSIS — Z7901 Long term (current) use of anticoagulants: Secondary | ICD-10-CM | POA: Insufficient documentation

## 2017-11-23 DIAGNOSIS — R109 Unspecified abdominal pain: Secondary | ICD-10-CM | POA: Diagnosis present

## 2017-11-23 LAB — URINALYSIS, COMPLETE (UACMP) WITH MICROSCOPIC
BILIRUBIN URINE: NEGATIVE
GLUCOSE, UA: NEGATIVE mg/dL
KETONES UR: NEGATIVE mg/dL
Nitrite: POSITIVE — AB
PH: 7 (ref 5.0–8.0)
Protein, ur: NEGATIVE mg/dL
SPECIFIC GRAVITY, URINE: 1.008 (ref 1.005–1.030)

## 2017-11-23 LAB — COMPREHENSIVE METABOLIC PANEL
ALK PHOS: 68 U/L (ref 38–126)
ALT: 6 U/L — ABNORMAL LOW (ref 14–54)
ANION GAP: 8 (ref 5–15)
AST: 18 U/L (ref 15–41)
Albumin: 2.7 g/dL — ABNORMAL LOW (ref 3.5–5.0)
BILIRUBIN TOTAL: 1 mg/dL (ref 0.3–1.2)
BUN: 7 mg/dL (ref 6–20)
CALCIUM: 9 mg/dL (ref 8.9–10.3)
CO2: 25 mmol/L (ref 22–32)
Chloride: 106 mmol/L (ref 101–111)
Creatinine, Ser: 0.57 mg/dL (ref 0.44–1.00)
Glucose, Bld: 92 mg/dL (ref 65–99)
POTASSIUM: 4.3 mmol/L (ref 3.5–5.1)
Sodium: 139 mmol/L (ref 135–145)
TOTAL PROTEIN: 7.4 g/dL (ref 6.5–8.1)

## 2017-11-23 LAB — INFLUENZA PANEL BY PCR (TYPE A & B)
INFLBPCR: NEGATIVE
Influenza A By PCR: NEGATIVE

## 2017-11-23 LAB — CBC
HEMATOCRIT: 39.4 % (ref 35.0–47.0)
HEMOGLOBIN: 12.4 g/dL (ref 12.0–16.0)
MCH: 26 pg (ref 26.0–34.0)
MCHC: 31.4 g/dL — AB (ref 32.0–36.0)
MCV: 82.6 fL (ref 80.0–100.0)
Platelets: 761 10*3/uL — ABNORMAL HIGH (ref 150–440)
RBC: 4.77 MIL/uL (ref 3.80–5.20)
RDW: 34.1 % — ABNORMAL HIGH (ref 11.5–14.5)
WBC: 4.7 10*3/uL (ref 3.6–11.0)

## 2017-11-23 LAB — LIPASE, BLOOD: Lipase: 15 U/L (ref 11–51)

## 2017-11-23 MED ORDER — CEPHALEXIN 500 MG PO CAPS: 500.0000 mg | ORAL_CAPSULE | Freq: Three times a day (TID) | ORAL | 0 refills | Status: AC

## 2017-11-23 MED ORDER — ACETAMINOPHEN 325 MG PO TABS
ORAL_TABLET | ORAL | Status: AC
  Administered 2017-11-23: 650 mg via ORAL
  Filled 2017-11-23: qty 2

## 2017-11-23 MED ORDER — CEFTRIAXONE SODIUM IN DEXTROSE 20 MG/ML IV SOLN
1.0000 g | Freq: Once | INTRAVENOUS | Status: AC
  Administered 2017-11-23: 1 g via INTRAVENOUS
  Filled 2017-11-23: qty 50

## 2017-11-23 MED ORDER — IPRATROPIUM-ALBUTEROL 0.5-2.5 (3) MG/3ML IN SOLN
3.0000 mL | Freq: Once | RESPIRATORY_TRACT | Status: AC
  Administered 2017-11-23: 3 mL via RESPIRATORY_TRACT
  Filled 2017-11-23: qty 3

## 2017-11-23 MED ORDER — ACETAMINOPHEN 325 MG PO TABS
650.0000 mg | ORAL_TABLET | Freq: Once | ORAL | Status: AC
  Administered 2017-11-23: 650 mg via ORAL

## 2017-11-23 MED ORDER — CEPHALEXIN 500 MG PO CAPS
500.0000 mg | ORAL_CAPSULE | Freq: Once | ORAL | Status: AC
  Administered 2017-11-23: 500 mg via ORAL
  Filled 2017-11-23: qty 1

## 2017-11-23 NOTE — ED Notes (Signed)
Pt states her pelvis is hurting. Verbal orders from Dr. Quentin Cornwall.

## 2017-11-23 NOTE — Progress Notes (Deleted)
[facility] day:  11/23/2017   Chief Complaint: Yolanda [place] is a 84 y.o. female with polycythemia rubra vera (PV) who is seen for 6 week assessment.  HPI:   The patient was last seen in the medical oncology clinic on 10/12/2017.  At that time, she felt "fine". She was taking hydroxyurea 26 pills/week.  Exam revealed chronic right sided entropion.  Hematocrit was 42.4  Platelet count was 566,000.  INR was 1.52.  Her Coumadin was adjusted (27m on Monday, Wednesday, and Friday, then 441mon Tuesday, Thursday, Saturday, and Sunday; total weekly dose 31 mg).  [facility] with shortness of breath.  CXR revealed mild elevation of the right hemidiaphragm, with associated atelectasis.  There was mild cardiomegaly.  CBC revealed a hematocrit of 39.0, hemoglobin 12.5, MCV 82.6, platelets 9444,000, WBC 4700 with an ANC of 3000.  Differential was unremakable.  INR was 3.33.  During the interim,   Past Medical History:  Diagnosis Date  . Acute deep vein thrombosis (DVT) of distal vein of left lower extremity (HCGrafton11/23/2016  . Acute pulmonary embolism (HCMedina11/23/2016  . Anticoagulant long-term use   . Arthritis   . CHF (congestive heart failure) (HCCaban  . Chronic anticoagulation 10/03/2015  . Collagen vascular disease (HCStarkweather  . [facility] in [facility] ([facility]  . Deep venous thrombosis (HCC)    right lower extremity  . Dependent edema   . Diverticulosis of intestine with bleeding 04/02/2015  . Glenohumeral arthritis 06/28/2012  . Hammertoe 09/02/2012  . History of bilateral hip replacements 09/02/2012  . History of hysterectomy   . Hypertension   . Hypokalemia   . Inability to ambulate due to hip 10/12/2015  . Mandibular dysfunction   . Onychomycosis 09/02/2012  . Osteoarthritis   . Polycythemia vera (HCEsmeralda  . Presence of IVC filter 05/25/2015  . Stroke (HCBig Horn05/16/2016   cerebellar  . Urinary incontinence  in female 09/02/2016    Past Surgical History:  Procedure Laterality Date  . ABDOMINAL HYSTERECTOMY    . BLEPHAROPLASTY Right 03/2017   right upper eyelid  . CARDIAC CATHETERIZATION Right 04/03/2015   Procedure: CENTRAL LINE INSERTION;  Surgeon: MaSherri RadMD;  Location: [facility] ORS;  Service: General;  Laterality: Right;  . COLECTOMY WITH COLOSTOMY CREATION/HARTMANN PROCEDURE N/A 04/03/2015   Procedure: COLECTOMY WITH COLOSTOMY CREATION/HARTMANN PROCEDURE;  Surgeon: MaSherri RadMD;  Location: [facility] ORS;  Service: General;  Laterality: N/A;  . COLON SURGERY    . FOOT AMPUTATION     partial  . FOOT AMPUTATION Right   . JOINT REPLACEMENT     Left and Right Hip  . PERIPHERAL VASCULAR CATHETERIZATION N/A 04/02/2015   Procedure: Visceral Angiography;  Surgeon: JaAlgernon HuxleyMD;  [place];  Service: Cardiovascular;  Laterality: N/A;  . PERIPHERAL VASCULAR CATHETERIZATION N/A 04/02/2015   Procedure: Visceral Artery Intervention;  Surgeon: JaAlgernon HuxleyMD;  Location: [facility];  Service: Cardiovascular;  Laterality: N/A;  . PERIPHERAL VASCULAR CATHETERIZATION N/A 05/25/2015   Procedure: IVC Filter Insertion;  Surgeon: GrKatha CabalMD;  [facility];  Service: Cardiovascular;  Laterality: N/A;  . TOE AMPUTATION     right  . TOTAL HIP ARTHROPLASTY      Family History  Problem Relation Age of Onset  . Brain cancer Mother   . Heart attack Father   . COPD Father   .  Coronary artery disease Father   . Hypertension Unknown   . Arthritis-Osteo Unknown   . Diabetes Brother   . Arthritis Brother   . Osteosarcoma Son   . Bone cancer Son   . Arthritis Sister   . Arthritis Brother   . Hypertension Brother   . Coronary artery disease Brother     Social History:  reports that  has never smoked. she has never used smokeless tobacco. She reports that she does not drink alcohol or use drugs.  She lives alone on [address] at [facility] (independent  living).  She moved out of [facility].  She has been living with her grand daughter since 02/06/2017.  Her grand-daughter visits regularly [name] [phone]).  The patient is accompanied by her grand daughter, [name], today.   Allergies:  Allergies  Allergen Reactions  . Sulfamethoxazole-Trimethoprim Nausea And Vomiting    Current Medications: Current Outpatient Medications  Medication Sig Dispense Refill  . amLODipine (NORVASC) 5 MG tablet Take 5 mg by mouth daily.    [name] aspirin EC 81 MG tablet Take 1 tablet (81 mg total) by mouth daily. 30 tablet 2  . docusate sodium (COLACE) 100 MG capsule Take 100 mg by mouth 2 (two) times daily.     [name] ENSURE (ENSURE) Take 1 Can by mouth 3 (three) times daily with meals.    . folic acid (FOLVITE) 062 MCG tablet Take 400 mcg by mouth daily.    . hydroxyurea (HYDREA) 500 MG capsule TAKE FOUR CAPSULES BY MOUTH DAILY 120 capsule 0  . ipratropium-albuterol (DUONEB) 0.5-2.5 (3) MG/3ML SOLN Take 3 mLs by nebulization every 4 (four) hours as needed.    [name] LORazepam (ATIVAN) 0.5 MG tablet Take 0.5 mg by mouth every 4 (four) hours as needed for anxiety.    . polyethylene glycol (MIRALAX / GLYCOLAX) packet Take 17 g by mouth daily.    . potassium chloride (K-DUR,KLOR-CON) 10 MEQ tablet Take 10 mEq by mouth 2 (two) times a week. On Monday and Thursday  With lasix for potassium replacement    . torsemide (DEMADEX) 10 MG tablet Take 10 mg by mouth daily.    [name] triamcinolone cream (KENALOG) 0.1 % Apply 1 application topically 2 (two) times daily. To affected area    . warfarin (COUMADIN) 5 MG tablet Take 5 mg by mouth every Monday, Wednesday, and Friday.      No current facility-administered medications for this visit.     Review of Systems:  GENERAL:  Feels "fine".  No fevers or sweats.  Weight down 24 pounds in 6 months. No new weight obtained today.  PERFORMANCE STATUS (ECOG):  3 HEENT:  Decreased vision in right eye s/p embolic event.  Vision  better s/p entropion surgery.  No runny nose, sore throat, mouth sores or tenderness. Lungs:  No shortness of breath or cough.  No hemoptysis. Cardiac:  No chest pain, palpitations, or PND.  Orthopnea.  Interval admission for CHF. GI:  No nausea, vomiting, diarrhea, constipation, melena or hematochezia.  Interval blood in stool, resolved. GU:  No urgency, frequency, dysuria, or hematuria.  Incontinent.  Wears adult diapers. Musculoskeletal:  Knee problems ("bone on bone").  No muscle tenderness. Extremities:  Chronic swelling in legs. Skin:  Dry skin.  No excess bruising. Neuro:  No headache, numbness or weakness, balance or coordination issues. Endocrine:  No diabetes, thyroid issues, hot flashes or night sweats. Psych:  No mood changes, depression or anxiety. Pain:  No focal pain. Review of  systems:  All other systems reviewed and found to be negative.  Physical Exam: There were no vitals taken for this visit. GENERAL:  Elderly woman sitting comfortably in a wheelchair in the exam room in no acute distress. MENTAL STATUS:  Alert and oriented to person, place and time. HEAD: Wearing a black cap.  Gray hair.  Normocephalic, atraumatic, face symmetric, no Cushingoid features. EYES:  Brown eyes.  Right sided entropion.  Pupils equal round and reactive to light and accomodation.  No conjunctivitis or scleral icterus. ENT:  Oropharynx clear without lesion.  Upper dentures.  Tongue normal. Mucous membranes moist.  RESPIRATORY:  Clear to auscultation without rales, wheezes or rhonchi. CARDIOVASCULAR:  Regular rate and rhythm without murmur, rub or gallop. ABDOMEN:  Left sided colostomy.  Stool brown.  Soft, non-tender, with active bowel sounds, and no appreciable hepatosplenomegaly.  No masses. SKIN:  Right side of face thickened and dark.  No rashes or ulcers. EXTREMITIES:  Chronic lower extremity changes.  No skin discoloration or tenderness.  No palpable cords. LYMPH NODES: No palpable  cervical, supraclavicular, axillary or inguinal adenopathy  NEUROLOGICAL: Unremarkable. PSYCH:  Appropriate.   No visits with results within 3 Day(s) from this visit.  Latest known visit with results is:  Admission on 11/07/2017, Discharged on 11/08/2017  Component Date Value Ref Range Status  . Prothrombin Time 11/07/2017 33.5* 11.4 - 15.2 seconds Final  . INR 11/07/2017 3.33   Final   Performed at [facility], [address] [place], [place] [address]  . WBC 11/07/2017 4.7  3.6 - 11.0 K/uL Final  . RBC 11/07/2017 4.73  3.80 - 5.20 MIL/uL Final  . Hemoglobin 11/07/2017 12.6  12.0 - 16.0 g/dL Final  . HCT 11/07/2017 39.0  35.0 - 47.0 % Final  . MCV 11/07/2017 82.6  80.0 - 100.0 fL Final  . MCH 11/07/2017 26.7  26.0 - 34.0 pg Final  . MCHC 11/07/2017 32.4  32.0 - 36.0 g/dL Final  . RDW 11/07/2017 32.9* 11.5 - 14.5 % Final  . Platelets 11/07/2017 944* 150 - 440 K/uL Final   Comment: CRITICAL RESULT CALLED TO, READ BACK BY AND VERIFIED WITH: PAULETTE WYATT ON 11/08/17 AT 2356 [facility] PLATELET COUNT CONFIRMED BY SMEAR   . Neutrophils Relative % 11/07/2017 64  % Final  . Neutro Abs 11/07/2017 3.0  1.4 - 6.5 K/uL Final  . Lymphocytes Relative 11/07/2017 27  % Final  . Lymphs Abs 11/07/2017 1.2  1.0 - 3.6 K/uL Final  . Monocytes Relative 11/07/2017 5  % Final  . Monocytes Absolute 11/07/2017 0.2  0.2 - 0.9 K/uL Final  . Eosinophils Relative 11/07/2017 3  % Final  . Eosinophils Absolute 11/07/2017 0.1  0 - 0.7 K/uL Final  . Basophils Relative 11/07/2017 1  % Final  . Basophils Absolute 11/07/2017 0.1  0 - 0.1 K/uL Final  . Smear Review 11/07/2017 POLYCHROMASIA PRESENT   Final   Comment: MIXED RBC POPULATION SMUDGE CELLS TEARDROP CELLS TARGET CELLS Performed at [facility], [address], [place], [place] [address]   . Sodium 11/07/2017 139  135 - 145 mmol/L Final  . Potassium 11/07/2017 4.1  3.5 - 5.1 mmol/L Final  . Chloride 11/07/2017 107  101 - 111 mmol/L  Final  . CO2 11/07/2017 26  22 - 32 mmol/L Final  . Glucose, Bld 11/07/2017 100* 65 - 99 mg/dL Final  . BUN 11/07/2017 12  6 - 20 mg/dL Final  . Creatinine, Ser 11/07/2017 0.50  0.44 - 1.00 mg/dL Final  . Calcium 11/07/2017 8.7* 8.9 - 10.3 mg/dL Final  . GFR calc non Af Amer 11/07/2017 >60  >60 mL/min Final  . GFR calc Af Amer 11/07/2017 >60  >60 mL/min Final   Comment: (NOTE) The eGFR has been calculated using the CKD EPI equation. This calculation has not been validated in all clinical situations. eGFR's persistently <60 mL/min signify possible Chronic Kidney Disease.   Georgiann Hahn gap 11/07/2017 6  5 - 15 Final   Performed at [facility], [address] [place], [place] [address]  . B Natriuretic Peptide 11/07/2017 55.0  0.0 - 100.0 pg/mL Final   Performed at [facility], [address] [place], [place] [address]  . Troponin I 11/07/2017 <0.03  <0.03 ng/mL Final   Performed at [facility], [address] [place], [place] [address]    Assessment:  Yolanda Wells is a 84 y.o. female African-American woman with JAK2+ polycythemia rubra vera (PV) previously on a phlebotomy program and hydroxyurea. She received P32 in an attempt to manage her counts in 03/2015 and on 05/14/2017. [name]   Course has been complicated by a cerebellar CVA on 04/01/2015, splenic flexure bleeding requiring micro-embolization then colectomy on 04/03/2015. She was diagnosed with bilateral lower extremity DVTs on 05/18/2015 and bilateral pulmonary emboli on 05/24/2015. She underwent IVC filter placement on 05/25/2015.  She has been on a fluctuating dose of Coumadin secondary to unstable INR.  She is on a baby aspirin.  She developed progressive erythrocytosis, thrombocytosis, and leukocytosis.  She underwent phlebotomy for a hematocrit of 55.4 on 09/23/2015.  Hematocrit decreased to 50.0, but has again increased after initiation of oral iron.  Platelet count has increased from 1.1  million to 1.4 million.  White count increased from 23,000 - 28,000 to 31,800.  She was admitted on 08/25/2016 with loss of vision in her right eye.  CBC on 08/25/2016 revealed a hematocrit of 40.0, hemoglobin 13.5, MCV 91, platelets 842,000, WBC 4900.  INR was 2.59.  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  MRI and MRA showed no acute changes.  She has been back on hydroxyurea since 10/02/2015.  Initial dose was 1000 mg a day.  She is currently taking 4 pills 4 days/week and 3 pills 3 days/week (total weekly dose: 24 pills).   She requires periodic phlebotomies (goal hematocrit <= 42).  Platelet count remains elevated (secondary to PV and likely some component of iron deficiency).  Goal platelet count is 400,000.  She is on Coumadin 4 mg daily  (total weekly dose 28 mg).  INR goal 2-3.  She was admitted to [facility] for 2.5 weeks with pneumonia then returned 07/03/2017 - 07/12/2017 with CHF.  She was admitted to [facility] from 09/26/2017 - 10/09/2017 with symptoms of heart failure and an E coli UTI.  She was treated with Lasix and ceftriaxone.  Echo revealed an EF of 60%.  CXR suggested pulmonary artery hypertension.    Symptomatically, she feels "fine". She is taking hydroxyurea 26 pills/week.  Exam reveals chronic right sided entropion.  Hematocrit is 42.4  Platelet count is 566,000.  INR is 1.52  Plan: 1.  Labs today:  CBC with diff, CMP, PT/INR. 2.  Discuss platelet goal of < 400,000. Continue Hydroxyurea 561m to 3 pills x 5 days week, and 4 pills x 2 days a week (total weekly dose of 23 pills). 3.  Schedule small volume phlebotomy on 10/16/2017 as hematocrit >  42.   4.  INR 1.52. Increase Coumadin to 70m on Monday, Wednesday, and Friday, then 457mon Tuesday, Thursday, Saturday, and Sunday.  5.  Set up [facility] home health for labs: (PT/INR, CBC with diff) weekly starting next week.  6.  RTC in 6 weeks for MD assessment and labs (CBC with diff,  CMP, PT/INR) +/- phlebotomy.   MeLequita AsalMD  11/23/2017, 5:48 AM   I saw and evaluated the patient, participating in the key portions of the service and reviewing pertinent diagnostic studies and records.  I reviewed the nurse practitioner's note and agree with the findings and the plan.  The assessment and plan were discussed with the patient.  Multiple questions were asked by the patient and answered.   MeNolon StallsMD 11/23/2017, 5:48 AM

## 2017-11-23 NOTE — ED Notes (Signed)
Patient transported to X-ray 

## 2017-11-23 NOTE — ED Notes (Signed)
Granddaughter whom patient lives with at bedside. States patient awoke this AM soaked in urine, which is unusual for patient. Reports dry heaves and nausea, burning in lower abdomen X 1 week. Called home health nurse, who advised to come here since patient PCP not available for appt today.

## 2017-11-23 NOTE — ED Notes (Signed)
Pt given snack and drink; colostomy bag emptied and brief changed. Pt denies further needs. Awaiting EMS transport back.

## 2017-11-23 NOTE — ED Provider Notes (Addendum)
[facility] Emergency Department Provider Note    First MD Initiated Contact with Patient 11/23/17 1025     (approximate)  I have reviewed the triage vital signs and the nursing notes.   HISTORY  Chief Complaint Abdominal Pain    HPI Quinn Bartling is a 84 y.o. female presents to the ER with a chief complaint of shortness of breath that started overnight as well as incontinent of urine and concern for UTI.  Granddaughter who cares for patient has been home sick with flulike illness and symptoms for the patient started this morning.  Was also complaining of abdominal pain and nausea but no vomiting.  She has had normal output from her colostomy bag.  No measured fevers at home.  Daughter states that symptoms were identical to this last year when she was diagnosed with the flu.  She is on Coumadin for history of DVT.  Denies any other symptoms or concerns at this time.  Past Medical History:  Diagnosis Date  . Acute deep vein thrombosis (DVT) of distal vein of left lower extremity ([place]) 10/03/2015  . Acute pulmonary embolism ([place]) 10/03/2015  . Anticoagulant long-term use   . Arthritis   . CHF (congestive heart failure) ([place])   . Chronic anticoagulation 10/03/2015  . Collagen vascular disease ([place])   . Colostomy in place [facility]) 10/04/2015  . Deep venous thrombosis (H[facility])    right lower extremity  . Dependent edema   . Diverticulosis of intestine with bleeding 04/02/2015  . Glenohumeral arthritis 06/28/2012  . Hammertoe 09/02/2012  . History of bilateral hip replacements 09/02/2012  . History of hysterectomy   . Hypertension   . Hypokalemia   . Inability to ambulate due to hip 10/12/2015  . Mandibular dysfunction   . Onychomycosis 09/02/2012  . Osteoarthritis   . Polycythemia vera ([place])   . Presence of IVC filter 05/25/2015  . 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 Urinary incontinence in female 09/02/2016   Family History  Problem Relation Age  of Onset  . Brain cancer Mother   . Heart attack Father   . COPD Father   . Coronary artery disease Father   . Hypertension Unknown   . Arthritis-Osteo Unknown   . Diabetes Brother   . Arthritis Brother   . Osteosarcoma Son   . Bone cancer Son   . Arthritis Sister   . Arthritis Brother   . Hypertension Brother   . Coronary artery disease Brother    Past Surgical History:  Procedure Laterality Date  . ABDOMINAL HYSTERECTOMY    . BLEPHAROPLASTY Right 03/2017   right upper eyelid  . CARDIAC CATHETERIZATION Right 04/03/2015   Procedure: CENTRAL LINE INSERTION;  Surgeon: Sherri Rad, MD;  Location: [facility] ORS;  Service: General;  Laterality: Right;  . COLECTOMY WITH COLOSTOMY CREATION/HARTMANN PROCEDURE N/A 04/03/2015   Procedure: COLECTOMY WITH COLOSTOMY CREATION/HARTMANN PROCEDURE;  Surgeon: Sherri Rad, MD;  Location: [facility] ORS;  Service: General;  Laterality: N/A;  . COLON SURGERY    . FOOT AMPUTATION     partial  . FOOT AMPUTATION Right   . JOINT REPLACEMENT     Left and Right Hip  . PERIPHERAL VASCULAR CATHETERIZATION N/A 04/02/2015   Procedure: Visceral Angiography;  Surgeon: Algernon Huxley, MD;  Location: [place] CV LAB;  Service: Cardiovascular;  Laterality: N/A;  . PERIPHERAL VASCULAR CATHETERIZATION N/A 04/02/2015   Procedure: Visceral Artery Intervention;  Surgeon: Algernon Huxley, MD;  Location: [place] [facility];  Service: Cardiovascular;  Laterality: N/A;  . PERIPHERAL VASCULAR CATHETERIZATION N/A 05/25/2015   Procedure: IVC Filter Insertion;  Surgeon: Katha Cabal, MD;  Location: [place] [facility];  Service: Cardiovascular;  Laterality: N/A;  . TOE AMPUTATION     right  . TOTAL HIP ARTHROPLASTY     Patient Active Problem List   Diagnosis Date Noted  . Palliative care by specialist   . DNR (do not resuscitate) discussion   . Weakness generalized   . Hypoxia 10/13/2017  . Acute CHF (congestive heart failure) ([place]) 10/06/2017  . Chronic diastolic CHF (congestive  heart failure) ([place]) 07/28/2017  . Dyspnea 05/27/2017  . Ileus ([place]) 04/10/2017  . Influenza with respiratory manifestation 12/01/2016  . Respiratory distress 11/23/2016  . Urinary incontinence in female 09/02/2016  . Vision loss of right eye 08/26/2016  . Blindness of right eye 08/26/2016  . Inability to ambulate due to hip 10/12/2015  . Colostomy in place [facility]) 10/04/2015  . Chronic anticoagulation 10/03/2015  . Acute pulmonary embolism ([place]) 10/03/2015  . Leukocytosis 09/25/2015  . Chest pain 09/25/2015  . COPD exacerbation ([place]) 09/24/2015  . CAP (community acquired pneumonia) 09/24/2015  . COPD (chronic obstructive pulmonary disease) ([place]) 09/24/2015  . SOB (shortness of breath) 09/22/2015  . Elevated troponin 09/15/2015  . UTI (urinary tract infection) 07/16/2015  . Abdominal wall cellulitis 07/15/2015  . DVT (deep venous thrombosis) ([place]) 07/15/2015  . HTN (hypertension) 07/15/2015  . Polycythemia vera ([place]) 07/15/2015  . Dependent edema 07/15/2015  . Arthritis 07/15/2015  . Pulmonary emboli ([place]) 05/25/2015  . Diverticulosis of colon with hemorrhage 04/02/2015  . Diverticulosis of intestine with bleeding 04/02/2015  . Dehydration, moderate 01/11/2015  . Fall 01/10/2015  . Osteoarthritis 01/10/2015  . Hammertoe 09/02/2012  . S/P transmetatarsal amputation of foot ([place]) 09/02/2012  . Onychomycosis 09/02/2012  . Other specified dermatoses 09/02/2012  . Hip pain 08/23/2012  . S/P hip replacement 08/23/2012  . Glenohumeral arthritis 06/28/2012      Prior to Admission medications   Medication Sig Start Date End Date Taking? Authorizing Provider  amLODipine (NORVASC) 5 MG tablet Take 5 mg by mouth daily.    [provider]  aspirin EC 81 MG tablet Take 1 tablet (81 mg total) by mouth daily. 09/16/15   [name], MD  cephALEXin (KEFLEX) 500 MG capsule Take 1 capsule (500 mg total) by mouth 3 (three) times daily for 7 days. 11/23/17 11/30/17  Merlyn Lot, MD  docusate sodium (COLACE) 100 MG capsule Take 100 mg by mouth 2 (two) times daily.     [provider]  ENSURE (ENSURE) Take 1 Can by mouth 3 (three) times daily with meals.    [provider]  folic acid (FOLVITE) 740 MCG tablet Take 400 mcg by mouth daily.    [provider]  hydroxyurea (HYDREA) 500 MG capsule TAKE FOUR CAPSULES BY MOUTH DAILY 10/19/17   Nolon Stalls C, MD  ipratropium-albuterol (DUONEB) 0.5-2.5 (3) MG/3ML SOLN Take 3 mLs by nebulization every 4 (four) hours as needed.    [provider]  LORazepam (ATIVAN) 0.5 MG tablet Take 0.5 mg by mouth every 4 (four) hours as needed for anxiety.    [provider]  polyethylene glycol (MIRALAX / GLYCOLAX) packet Take 17 g by mouth daily.    [provider]  potassium chloride (K-DUR,KLOR-CON) 10 MEQ tablet Take 10 mEq by mouth 2 (two) times a week. On Monday and Thursday  With lasix for potassium replacement  [provider]  torsemide (DEMADEX) 10 MG tablet Take 10 mg by mouth daily.    [provider]  triamcinolone cream (KENALOG) 0.1 % Apply 1 application topically 2 (two) times daily. To affected area    [provider]  warfarin (COUMADIN) 5 MG tablet Take 5 mg by mouth every Monday, Wednesday, and Friday.     [provider]    Allergies Sulfamethoxazole-trimethoprim    Social History Social History   Tobacco Use  . Smoking status: Never Smoker  . Smokeless tobacco: Never Used  Substance Use Topics  . Alcohol use: No  . Drug use: No    Review of Systems Patient denies headaches, rhinorrhea, blurry vision, numbness, shortness of breath, chest pain, edema, cough, abdominal pain, nausea, vomiting, diarrhea, dysuria, fevers, rashes or hallucinations unless otherwise stated above in HPI. ____________________________________________   PHYSICAL EXAM:  VITAL SIGNS: Vitals:   11/23/17 1200 11/23/17 1230  BP:  127/74 (!) 152/98  Pulse: 80 88  Resp:    Temp:    SpO2: 93% 95%    Constitutional: Alert and oriented. Well appearing and in no acute distress. Eyes: Conjunctivae are normal.  Head: Atraumatic. Nose: No congestion/rhinnorhea. Mouth/Throat: Mucous membranes are moist.   Neck: No stridor. Painless ROM.  Cardiovascular: Normal rate, regular rhythm. Grossly normal heart sounds.  Good peripheral circulation. Respiratory: Normal respiratory effort.  No retractions. Lungs with occasional scattered wheeze, no rhonchi Gastrointestinal: Soft and nontender. No distention. No abdominal bruits. No CVA tenderness.  llq colostomy appears c/d/i Genitourinary:  Musculoskeletal: No lower extremity tenderness nor edema.  No joint effusions. Neurologic:  Normal speech and language. No gross focal neurologic deficits are appreciated. No facial droop Skin:  Skin is warm, dry and intact. No rash noted. Psychiatric: Mood and affect are normal. Speech and behavior are normal.  ____________________________________________   LABS (all labs ordered are listed, but only abnormal results are displayed)  Results for orders placed or performed during the hospital encounter of 11/23/17 (from the past 24 hour(s))  Lipase, blood     Status: None   Collection Time: 11/23/17 10:27 AM  Result Value Ref Range   Lipase 15 11 - 51 U/L  Comprehensive metabolic panel     Status: Abnormal   Collection Time: 11/23/17 10:27 AM  Result Value Ref Range   Sodium 139 135 - 145 mmol/L   Potassium 4.3 3.5 - 5.1 mmol/L   Chloride 106 101 - 111 mmol/L   CO2 25 22 - 32 mmol/L   Glucose, Bld 92 65 - 99 mg/dL   BUN 7 6 - 20 mg/dL   Creatinine, Ser 0.57 0.44 - 1.00 mg/dL   Calcium 9.0 8.9 - 10.3 mg/dL   Total Protein 7.4 6.5 - 8.1 g/dL   Albumin 2.7 (L) 3.5 - 5.0 g/dL   AST 18 15 - 41 U/L   ALT 6 (L) 14 - 54 U/L   Alkaline Phosphatase 68 38 - 126 U/L   Total Bilirubin 1.0 0.3 - 1.2 mg/dL   GFR calc non Af Amer >60 >60  mL/min   GFR calc Af Amer >60 >60 mL/min   Anion gap 8 5 - 15  CBC     Status: Abnormal   Collection Time: 11/23/17 10:27 AM  Result Value Ref Range   WBC 4.7 3.6 - 11.0 K/uL   RBC 4.77 3.80 - 5.20 MIL/uL   Hemoglobin 12.4 12.0 - 16.0 g/dL   HCT 39.4 35.0 - 47.0 %  MCV 82.6 80.0 - 100.0 fL   MCH 26.0 26.0 - 34.0 pg   MCHC 31.4 (L) 32.0 - 36.0 g/dL   RDW 34.1 (H) 11.5 - 14.5 %   Platelets 761 (H) 150 - 440 K/uL  Urinalysis, Complete w Microscopic     Status: Abnormal   Collection Time: 11/23/17 10:27 AM  Result Value Ref Range   Color, Urine YELLOW (A) YELLOW   APPearance CLOUDY (A) CLEAR   Specific Gravity, Urine 1.008 1.005 - 1.030   pH 7.0 5.0 - 8.0   Glucose, UA NEGATIVE NEGATIVE mg/dL   Hgb urine dipstick SMALL (A) NEGATIVE   Bilirubin Urine NEGATIVE NEGATIVE   Ketones, ur NEGATIVE NEGATIVE mg/dL   Protein, ur NEGATIVE NEGATIVE mg/dL   Nitrite POSITIVE (A) NEGATIVE   Leukocytes, UA LARGE (A) NEGATIVE   RBC / HPF 0-5 0 - 5 RBC/hpf   WBC, UA TOO NUMEROUS TO COUNT 0 - 5 WBC/hpf   Bacteria, UA MANY (A) NONE SEEN   Squamous Epithelial / LPF 0-5 (A) NONE SEEN   WBC Clumps PRESENT    Mucus PRESENT    Hyaline Casts, UA PRESENT   Influenza panel by PCR (type A & B)     Status: None   Collection Time: 11/23/17 11:35 AM  Result Value Ref Range   Influenza A By PCR NEGATIVE NEGATIVE   Influenza B By PCR NEGATIVE NEGATIVE   ____________________________________________  ___________________________  RADIOLOGY  I personally reviewed all radiographic images ordered to evaluate for the above acute complaints and reviewed radiology reports and findings.  These findings were personally discussed with the patient.  Please see medical record for radiology report.  ____________________________________________   PROCEDURES  Procedure(s) performed:  Procedures    Critical Care performed: no ____________________________________________   INITIAL IMPRESSION / ASSESSMENT AND  PLAN / ED COURSE  Pertinent labs & imaging results that were available during my care of the patient were reviewed by me and considered in my medical decision making (see chart for details).  DDX: flu, ili, copd, bronchitis, pna, uti, sepsis  Zoii Florer is a 84 y.o. who presents to the ED with symptoms as described above.  Patient is afebrile and hemodynamically stable.  Her abdominal exam is soft and benign.  Blood work is reassuring.  Urinalysis does show UTI suggesting acute cystitis.  On review of previous cultures patient did grow E. coli that was sensitive to Keflex.  Will give dose at this start therapy.  No signs or symptoms of sepsis.  Chest x-ray and fluid will be sent for the patient's cough she is in the high risk category.  She does not have any hypoxia.   Patient's flu is negative.  She remains [place] dynamically stable.  No hypoxia.  Patient tolerated Keflex.  At this point do believe patient is stable and appropriate discharge home.  She has access to nebulizers.  Seems less consistent with COPD or bronchitis.   Clinical Course as of Nov 23 1246  Mon Nov 23, 2017  1247 After discussion regarding the results of the family.  They requested dose of IV antibiotics as she has had multiple episodes and rounds of oral antibiotics in the past.  Will send culture as well.  Have discussed with the patient and available family all diagnostics and treatments performed thus far and all questions were answered to the best of my ability. The patient demonstrates understanding and agreement with plan.   [PR]    Clinical Course User Index [  PR] Merlyn Lot, MD    ____________________________________________   FINAL CLINICAL IMPRESSION(S) / ED DIAGNOSES  Final diagnoses:  Acute cystitis with hematuria      NEW MEDICATIONS STARTED DURING THIS VISIT:  New Prescriptions   CEPHALEXIN (KEFLEX) 500 MG CAPSULE    Take 1 capsule (500 mg total) by mouth 3 (three) times daily for 7  days.     Note:  This document was prepared using Dragon voice recognition software and may include unintentional dictation errors.    Merlyn Lot, MD 11/23/17 [place]    Merlyn Lot, MD 11/23/17 1249

## 2017-11-23 NOTE — ED Notes (Signed)
Narda Rutherford, RN at bedside to assist with in and out cath. Pt is incontinent of urine.

## 2017-11-23 NOTE — ED Notes (Signed)
Burning pain to center lower abdomen that started this AM.

## 2017-11-23 NOTE — ED Notes (Signed)
Pt awaiting to go home by EMS.

## 2017-11-23 NOTE — ED Triage Notes (Signed)
Pt arrived via EMS from home for reports of lower abdominal pain and burning upon urination. EMS reports [place] planned to come Thursday to do in and out cath to check for UTI. Pt has colostomy bag. EMS reports VSS.

## 2017-11-23 NOTE — Discharge Instructions (Signed)

## 2017-11-23 NOTE — ED Notes (Signed)
Pt granddaughter, [name] requesting that patient be sent home by EMS. Patient is able to get from bed to wheelchair at home but granddaughter reports that she gest very short of breath. Granddaughter does not feel that she can safely transport patient home in POV due to moving from stretcher to wheelchair to car and from car to wheelchair and into home. Medical necessity formed filled out to reflect this conversation. Explained to granddaughter that the ambulance ride may not be paid for. Granddaughter acknowledges this.

## 2017-11-23 NOTE — ED Notes (Signed)
Pt left with EMS to go back home. All of belongings with patient. Granddaughter notified.

## 2017-11-26 LAB — URINE CULTURE: Culture: >=100000 — AB

## 2017-12-03 ENCOUNTER — Telehealth: Payer: Self-pay | Admitting: *Deleted

## 2017-12-03 ENCOUNTER — Encounter: Payer: Self-pay | Admitting: *Deleted

## 2017-12-03 NOTE — Telephone Encounter (Signed)
Dawn nurse with [facility] called reporting that she had left message on the 11th of INR 2.9 and she needed orders of when to draw patient again. She states she never heard back from [place]. I see no documentation of that call. Please advise of when next INR needs to be drawn, call or fax order to her ph [phone], fax [phone]

## 2017-12-03 NOTE — Telephone Encounter (Signed)
   She should have an INR drawn now.  M

## 2017-12-03 NOTE — Telephone Encounter (Signed)
Dawn advised of need to draw physical therapy INR and she will go out and draw it tomorrow

## 2017-12-04 ENCOUNTER — Encounter: Payer: Self-pay | Admitting: *Deleted

## 2017-12-04 ENCOUNTER — Telehealth: Payer: Self-pay | Admitting: *Deleted

## 2017-12-04 LAB — POCT INR: INR: 1.5

## 2017-12-04 MED ORDER — WARFARIN SODIUM 3 MG PO TABS: 3.0000 mg | ORAL_TABLET | ORAL | 0 refills | Status: DC

## 2017-12-04 MED ORDER — WARFARIN SODIUM 4 MG PO TABS: ORAL_TABLET | ORAL | Status: DC

## 2017-12-04 NOTE — Telephone Encounter (Signed)
  Please call patient.  What dose is she taking?  Any missed doses?  Any change in diet?  M

## 2017-12-04 NOTE — Telephone Encounter (Addendum)
Per Dr Mike Gip, Warfarin 4 mg 6 days a week and 3 mg 1 day a week Recheck INR one week. Avoid large changes in leafy green veggies. Order called to [facility].

## 2017-12-04 NOTE — Telephone Encounter (Signed)
PT / INR 18.5 / 1.5 today. Please call Dawn with new orders [phone]

## 2017-12-04 NOTE — Telephone Encounter (Signed)
Per Yolanda Wells, her current dose is 4 mg 4 days a week and 3 mg 3 days a week. She has not missed any doses. She has eaten collard greens on Sunday and Monday. Yolanda Wells reports that patient has increased bruising on he abdominal mostly. I asked about whether a gait belt is used and she states that her care giver uses one and she forgot about that.

## 2017-12-04 NOTE — Telephone Encounter (Signed)
I attempted calling Yolanda Wells, but got voice mail. I left message that I need to know what dose she is givnng patient and if she has missed any doses and that we need to know if there has been any diet changes. I asked that she please return my call

## 2017-12-04 NOTE — Telephone Encounter (Signed)
Left message on Yolanda Wells's voice mail with medicine order changes and diet instructions and asked that she call me back

## 2017-12-07 ENCOUNTER — Other Ambulatory Visit: Payer: Self-pay | Admitting: Hematology and Oncology

## 2017-12-11 ENCOUNTER — Telehealth: Payer: Self-pay | Admitting: *Deleted

## 2017-12-11 NOTE — Telephone Encounter (Signed)
Dawn RN with [facility] called PT/INR from today 21.5/2.1  New orders? Next draw date?

## 2017-12-11 NOTE — Telephone Encounter (Signed)
Orders called to [facility] who repeated back to me

## 2017-12-11 NOTE — Telephone Encounter (Signed)
  Please call patient.  Continue current Coumadin dose.  Labs in 1 week:  CBC with diff, PT/INR.   M

## 2017-12-21 ENCOUNTER — Other Ambulatory Visit: Payer: Self-pay | Admitting: *Deleted

## 2017-12-21 DIAGNOSIS — D45 Polycythemia vera: Secondary | ICD-10-CM

## 2017-12-22 ENCOUNTER — Telehealth: Payer: Self-pay | Admitting: *Deleted

## 2017-12-22 NOTE — Telephone Encounter (Signed)
Patient scheduled for the first available appt.as requested.  However, her granddaughter [name], stated that she can only come on Wed.  So Lab/MD/+/-Phlebotomy is scheduled for tomorrow 12/23/17 in [place].  She is aware of Location, Date and Time.

## 2017-12-23 ENCOUNTER — Inpatient Hospital Stay: Payer: Medicare Other

## 2017-12-23 ENCOUNTER — Other Ambulatory Visit: Payer: Self-pay | Admitting: Hematology and Oncology

## 2017-12-23 ENCOUNTER — Encounter: Payer: Self-pay | Admitting: Hematology and Oncology

## 2017-12-23 ENCOUNTER — Inpatient Hospital Stay: Payer: Medicare Other | Attending: Hematology and Oncology | Admitting: Hematology and Oncology

## 2017-12-23 VITALS — BP 141/82 | HR 76 | Temp 96.8°F

## 2017-12-23 DIAGNOSIS — Z7901 Long term (current) use of anticoagulants: Secondary | ICD-10-CM

## 2017-12-23 DIAGNOSIS — I82509 Chronic embolism and thrombosis of unspecified deep veins of unspecified lower extremity: Secondary | ICD-10-CM

## 2017-12-23 DIAGNOSIS — Z7189 Other specified counseling: Secondary | ICD-10-CM | POA: Insufficient documentation

## 2017-12-23 DIAGNOSIS — Z8673 Personal history of transient ischemic attack (TIA), and cerebral infarction without residual deficits: Secondary | ICD-10-CM | POA: Insufficient documentation

## 2017-12-23 DIAGNOSIS — I2782 Chronic pulmonary embolism: Secondary | ICD-10-CM

## 2017-12-23 DIAGNOSIS — R300 Dysuria: Secondary | ICD-10-CM | POA: Insufficient documentation

## 2017-12-23 DIAGNOSIS — D45 Polycythemia vera: Secondary | ICD-10-CM | POA: Diagnosis present

## 2017-12-23 LAB — CBC WITH DIFFERENTIAL/PLATELET
Basophils Absolute: 0.2 10*3/uL — ABNORMAL HIGH (ref 0–0.1)
Basophils Relative: 5 %
Eosinophils Absolute: 0.1 10*3/uL (ref 0–0.7)
Eosinophils Relative: 3 %
HCT: 38.8 % (ref 35.0–47.0)
Hemoglobin: 12.7 g/dL (ref 12.0–16.0)
Lymphocytes Relative: 32 %
Lymphs Abs: 1 10*3/uL (ref 1.0–3.6)
MCH: 26.3 pg (ref 26.0–34.0)
MCHC: 32.6 g/dL (ref 32.0–36.0)
MCV: 80.7 fL (ref 80.0–100.0)
Monocytes Absolute: 0.1 10*3/uL — ABNORMAL LOW (ref 0.2–0.9)
Monocytes Relative: 3 %
Neutro Abs: 1.8 10*3/uL (ref 1.4–6.5)
Neutrophils Relative %: 57 %
Platelets: 1332 10*3/uL (ref 150–440)
RBC: 4.8 MIL/uL (ref 3.80–5.20)
RDW: 35.4 % — ABNORMAL HIGH (ref 11.5–14.5)
WBC: 3.2 10*3/uL — ABNORMAL LOW (ref 3.6–11.0)

## 2017-12-23 LAB — COMPREHENSIVE METABOLIC PANEL
ALT: 9 U/L — ABNORMAL LOW (ref 14–54)
AST: 22 U/L (ref 15–41)
Albumin: 3 g/dL — ABNORMAL LOW (ref 3.5–5.0)
Alkaline Phosphatase: 76 U/L (ref 38–126)
Anion gap: 5 (ref 5–15)
BUN: 9 mg/dL (ref 6–20)
CO2: 26 mmol/L (ref 22–32)
Calcium: 8.2 mg/dL — ABNORMAL LOW (ref 8.9–10.3)
Chloride: 101 mmol/L (ref 101–111)
Creatinine, Ser: 0.54 mg/dL (ref 0.44–1.00)
GFR calc Af Amer: >60 mL/min (ref >60)
GFR calc non Af Amer: >60 mL/min (ref >60)
Glucose, Bld: 108 mg/dL — ABNORMAL HIGH (ref 65–99)
Potassium: 3.7 mmol/L (ref 3.5–5.1)
Sodium: 132 mmol/L — ABNORMAL LOW (ref 135–145)
Total Bilirubin: 0.8 mg/dL (ref 0.3–1.2)
Total Protein: 7.6 g/dL (ref 6.5–8.1)

## 2017-12-23 LAB — PROTIME-INR
INR: 2.86
Prothrombin Time: 29.8 seconds — ABNORMAL HIGH (ref 11.4–15.2)

## 2017-12-23 NOTE — Progress Notes (Signed)
[facility] day:  12/23/2017   Chief Complaint: Yolanda Wells is a 84 y.o. female with polycythemia rubra vera (PV) who is seen for 2 month assessment.  HPI:   The patient was last seen in the medical oncology clinic on 10/12/2017.  At that time, she felt "fine". She was taking hydroxyurea 25 pills/week.  Exam revealed chronic right sided entropion.  Hematocrit was 42.4  Platelet count was 566,000.  INR was 1.52.  Her Coumadin was adjusted (52m on Monday, Wednesday, and Friday, then 428mon Tuesday, Thursday, Saturday, and Sunday; total weekly dose 31 mg).  [facility] shortness of breath.  CXR revealed mild elevation of the right hemidiaphragm, with associated atelectasis.  There was mild cardiomegaly.  CBC revealed a hematocrit of 39.0, hemoglobin 12.5, MCV 82.6, platelets 944,000, WBC 4700 with an ANC of 3000.  Differential was unremakable.  INR was 3.33.  She has had outside labs performed during the interim.  She has had her hydroxyurea and Coumadin adjusted.  Symptomatically, patient is doing "fine". She continues to have dysuria. [facility] Hydrea 25 pills a week (4 pills on T,Th,Sat,Sun and 3 pills on M,W,F). She is also taking Coumadin 40m45m days a week and 3mg23m Thursday.   Patient has no acute concerns. She is eating well. She denies pain in the clinic today.    Past Medical History:  Diagnosis Date  . Acute deep vein thrombosis (DVT) of distal vein of left lower extremity (HCC)[place]/23/2016  . Acute pulmonary embolism (HCC)[place]/23/2016  . Anticoagulant long-term use   . Arthritis   . CHF (congestive heart failure) (HCC)[place]. Chronic anticoagulation 10/03/2015  . Collagen vascular disease (HCC)[place]. [facility]  . Deep venous thrombosis (HCC)    right lower extremity  . Dependent edema   .  Diverticulosis of intestine with bleeding 04/02/2015  . Glenohumeral arthritis 06/28/2012  . Hammertoe 09/02/2012  . History of bilateral hip replacements 09/02/2012  . History of hysterectomy   . Hypertension   . Hypokalemia   . Inability to ambulate due to hip 10/12/2015  . Mandibular dysfunction   . Onychomycosis 09/02/2012  . Osteoarthritis   . Polycythemia vera (HCC)[place]. Presence of IVC filter 05/25/2015  . 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 Urinary incontinence in female 09/02/2016    Past Surgical History:  Procedure Laterality Date  . ABDOMINAL HYSTERECTOMY    . BLEPHAROPLASTY Right 03/2017   right upper eyelid  . CARDIAC CATHETERIZATION Right 04/03/2015   Procedure: CENTRAL LINE INSERTION;  Surgeon: MarkSherri Rad;  Location: [facility] ORS;  Service: General;  Laterality: Right;  . COLECTOMY WITH COLOSTOMY CREATION/HARTMANN PROCEDURE N/A 04/03/2015   Procedure: COLECTOMY WITH COLOSTOMY CREATION/HARTMANN PROCEDURE;  Surgeon: MarkSherri Rad;  Location: [facility] ORS;  Service: General;  Laterality: N/A;  . COLON SURGERY    . FOOT AMPUTATION     partial  . FOOT AMPUTATION Right   . JOINT REPLACEMENT     Left and Right Hip  . PERIPHERAL VASCULAR CATHETERIZATION N/A 04/02/2015   Procedure: Visceral Angiography;  Surgeon: JasoAlgernon Huxley;  [facility];  Service: Cardiovascular;  Laterality: N/A;  . PERIPHERAL VASCULAR CATHETERIZATION N/A 04/02/2015   Procedure: Visceral Artery Intervention;  Surgeon: JasoCorene Cornea  Bunnie Domino, MD;  Location: [facility];  Service: Cardiovascular;  Laterality: N/A;  . PERIPHERAL VASCULAR CATHETERIZATION N/A 05/25/2015   Procedure: IVC Filter Insertion;  Surgeon: Katha Cabal, MD;  Location: [facility];  Service: Cardiovascular;  Laterality: N/A;  . TOE AMPUTATION     right  . TOTAL HIP ARTHROPLASTY      Family History  Problem Relation Age of Onset  . Brain cancer Mother   . Heart attack Father   . COPD Father   .  Coronary artery disease Father   . Hypertension Unknown   . Arthritis-Osteo Unknown   . Diabetes Brother   . Arthritis Brother   . Osteosarcoma Son   . Bone cancer Son   . Arthritis Sister   . Arthritis Brother   . Hypertension Brother   . Coronary artery disease Brother     Social History:  reports that  has never smoked. she has never used smokeless tobacco. She reports that she does not drink alcohol or use drugs.  She lives alone on [address] at [facility] (independent living).  She moved out of [facility].  She has been living with her grand daughter since 02/06/2017.  Her grand-daughter visits regularly [name] [phone]).  The patient is accompanied by her grand daughter, [name], today.   Allergies:  Allergies  Allergen Reactions  . Sulfamethoxazole-Trimethoprim Nausea And Vomiting    Current Medications: Current Outpatient Medications  Medication Sig Dispense Refill  . amLODipine (NORVASC) 5 MG tablet Take 5 mg by mouth daily.    [name] aspirin EC 81 MG tablet Take 1 tablet (81 mg total) by mouth daily. 30 tablet 2  . docusate sodium (COLACE) 100 MG capsule Take 100 mg by mouth 2 (two) times daily.     [name] ENSURE (ENSURE) Take 1 Can by mouth 3 (three) times daily with meals.    . folic acid (FOLVITE) 034 MCG tablet Take 400 mcg by mouth daily.    . hydroxyurea (HYDREA) 500 MG capsule TAKE FOUR CAPSULES BY MOUTH DAILY 120 capsule 0  . ipratropium-albuterol (DUONEB) 0.5-2.5 (3) MG/3ML SOLN Take 3 mLs by nebulization every 4 (four) hours as needed.    [name] LORazepam (ATIVAN) 0.5 MG tablet Take 0.5 mg by mouth every 4 (four) hours as needed for anxiety.    . polyethylene glycol (MIRALAX / GLYCOLAX) packet Take 17 g by mouth daily.    . potassium chloride (K-DUR,KLOR-CON) 10 MEQ tablet Take 10 mEq by mouth 2 (two) times a week. On Monday and Thursday  With lasix for potassium replacement    . torsemide (DEMADEX) 10 MG tablet Take 10 mg by mouth daily.    [name] triamcinolone  cream (KENALOG) 0.1 % Apply 1 application topically 2 (two) times daily. To affected area    . warfarin (COUMADIN) 3 MG tablet TAKE 1 TABLET BY MOUTH ALONG WITH A '1MG'$  TABLET FOR A TOTAL DOSE OF '4MG'$  4 DAYS A WEEK. TAKE 3.'5MG'$  BY MOUTH 3 DAYS A WEEK 30 tablet 0  . warfarin (COUMADIN) 4 MG tablet 6 days a week 30 tablet    No current facility-administered medications for this visit.     Review of Systems:  GENERAL:  Feels "fine".  No fevers or sweats.  Weight down 24 pounds in 6 months. No new weight obtained today.  PERFORMANCE STATUS (ECOG):  3 HEENT:  Decreased vision in right eye s/p embolic event.  Vision better s/p entropion surgery.  No runny nose,  sore throat, mouth sores or tenderness. Lungs:  No shortness of breath or cough.  No hemoptysis. Cardiac:  No chest pain, palpitations, or PND.  Orthopnea.  Interval admission for CHF. GI:  No nausea, vomiting, diarrhea, constipation, melena or hematochezia.  Interval blood in stool, resolved. GU:  No urgency, frequency, dysuria, or hematuria.  Incontinent.  Wears adult diapers. Interval UTI. Musculoskeletal:  Knee problems ("bone on bone").  No muscle tenderness. Extremities:  Chronic swelling in legs. Skin:  Dry skin.  No excess bruising. Neuro:  No headache, numbness or weakness, balance or coordination issues. Endocrine:  No diabetes, thyroid issues, hot flashes or night sweats. Psych:  No mood changes, depression or anxiety. Pain:  No focal pain today. Review of systems:  All other systems reviewed and found to be negative.  Physical Exam: Blood pressure (!) 141/82, pulse 76, temperature (!) 96.8 F (36 C), temperature source Tympanic. GENERAL:  Elderly woman sitting comfortably in a wheelchair in the exam room in no acute distress. MENTAL STATUS:  Alert and oriented to person, place and time. HEAD: Wearing a cap.  Gray hair.  Normocephalic, atraumatic, face symmetric, no Cushingoid features. EYES:  Brown eyes.  Right sided entropion.   Pupils equal round and reactive to light and accomodation.  No conjunctivitis or scleral icterus. ENT:  Oropharynx clear without lesion.  Upper dentures.  Tongue normal. Mucous membranes moist.  RESPIRATORY:  Clear to auscultation without rales, wheezes or rhonchi. CARDIOVASCULAR:  Regular rate and rhythm without murmur, rub or gallop. ABDOMEN:  Left sided colostomy.  Stool brown.  Soft, non-tender, with active bowel sounds, and no appreciable hepatosplenomegaly.  No masses. SKIN:  Right side of face thickened and dark.  No rashes or ulcers. EXTREMITIES:  Chronic lower extremity changes.  No skin discoloration or tenderness.  No palpable cords. LYMPH NODES: No palpable cervical, supraclavicular, axillary or inguinal adenopathy  NEUROLOGICAL: Unremarkable. PSYCH:  Appropriate.   Appointment on 12/23/2017  Component Date Value Ref Range Status  . Prothrombin Time 12/23/2017 29.8* 11.4 - 15.2 seconds Final  . INR 12/23/2017 2.86   Final   Performed at [facility], [address] [place], [place] [address]  . Sodium 12/23/2017 132* 135 - 145 mmol/L Final  . Potassium 12/23/2017 3.7  3.5 - 5.1 mmol/L Final  . Chloride 12/23/2017 101  101 - 111 mmol/L Final  . CO2 12/23/2017 26  22 - 32 mmol/L Final  . Glucose, Bld 12/23/2017 108* 65 - 99 mg/dL Final  . BUN 12/23/2017 9  6 - 20 mg/dL Final  . Creatinine, Ser 12/23/2017 0.54  0.44 - 1.00 mg/dL Final  . Calcium 12/23/2017 8.2* 8.9 - 10.3 mg/dL Final  . Total Protein 12/23/2017 7.6  6.5 - 8.1 g/dL Final  . Albumin 12/23/2017 3.0* 3.5 - 5.0 g/dL Final  . AST 12/23/2017 22  15 - 41 U/L Final  . ALT 12/23/2017 9* 14 - 54 U/L Final  . Alkaline Phosphatase 12/23/2017 76  38 - 126 U/L Final  . Total Bilirubin 12/23/2017 0.8  0.3 - 1.2 mg/dL Final  . GFR calc non Af Amer 12/23/2017 >60  >60 mL/min Final  . GFR calc Af Amer 12/23/2017 >60  >60 mL/min Final   Comment: (NOTE) The eGFR has been calculated using the CKD EPI  equation. This calculation has not been validated in all clinical situations. eGFR's persistently <60 mL/min signify possible Chronic Kidney Disease.   . Anion gap 12/23/2017 5  5 - 15 Final  Performed at [facility], [address], [place], [place] [address]  . WBC 12/23/2017 3.2* 3.6 - 11.0 K/uL Final  . RBC 12/23/2017 4.80  3.80 - 5.20 MIL/uL Final  . Hemoglobin 12/23/2017 12.7  12.0 - 16.0 g/dL Final  . HCT 12/23/2017 38.8  35.0 - 47.0 % Final  . MCV 12/23/2017 80.7  80.0 - 100.0 fL Final  . MCH 12/23/2017 26.3  26.0 - 34.0 pg Final  . MCHC 12/23/2017 32.6  32.0 - 36.0 g/dL Final  . RDW 12/23/2017 35.4* 11.5 - 14.5 % Final  . Platelets 12/23/2017 1,332* 150 - 440 K/uL Final   Comment: CRITICAL RESULT CALLED TO, READ BACK BY AND VERIFIED WITH: CALLED TO KRISTIN PHILLIPS [id number]   . Neutrophils Relative % 12/23/2017 57  % Final  . Neutro Abs 12/23/2017 1.8  1.4 - 6.5 K/uL Final  . Lymphocytes Relative 12/23/2017 32  % Final  . Lymphs Abs 12/23/2017 1.0  1.0 - 3.6 K/uL Final  . Monocytes Relative 12/23/2017 3  % Final  . Monocytes Absolute 12/23/2017 0.1* 0.2 - 0.9 K/uL Final  . Eosinophils Relative 12/23/2017 3  % Final  . Eosinophils Absolute 12/23/2017 0.1  0 - 0.7 K/uL Final  . Basophils Relative 12/23/2017 5  % Final  . Basophils Absolute 12/23/2017 0.2* 0 - 0.1 K/uL Final   Performed at [facility], [address] [place],  [address]    Assessment:  Aveen Stansel is a 84 y.o. female African-American woman with JAK2+ polycythemia rubra vera (PV) previously on a phlebotomy program and hydroxyurea. She received P32 in an attempt to manage her counts in 03/2015 and on 05/14/2017.  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 She was diagnosed with bilateral lower extremity DVTs on 05/18/2015 and bilateral pulmonary emboli on 05/24/2015.  She underwent IVC filter placement on 05/25/2015.  She has been on a fluctuating dose of Coumadin secondary to unstable INR.  She is on a baby aspirin.  She developed progressive erythrocytosis, thrombocytosis, and leukocytosis.  She underwent phlebotomy for a hematocrit of 55.4 on 09/23/2015.  Hematocrit decreased to 50.0, but has again increased after initiation of oral iron.  Platelet count has increased from 1.1 million to 1.4 million.  White count increased from 23,000 - 28,000 to 31,800.  She was admitted on 08/25/2016 with loss of vision in her right eye.  CBC on 08/25/2016 revealed a hematocrit of 40.0, hemoglobin 13.5, MCV 91, platelets 842,000, WBC 4900.  INR was 2.59.  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  MRI and MRA showed no acute changes.  She has been back on hydroxyurea since 10/02/2015.  Initial dose was 1000 mg a day.  She is currently taking 4 pills 4 days/week and 3 pills 3 days/week (total weekly dose: 24 pills).   She requires periodic phlebotomies (goal hematocrit <= 42).  Platelet count remains elevated (secondary to PV and likely some component of iron deficiency).  Goal platelet count is 400,000.  She is on Coumadin 4 mg daily  (total weekly dose 28 mg).  INR goal 2-3.  She was admitted to [facility] for 2.5 weeks with pneumonia then returned 07/03/2017 - 07/12/2017 with CHF.  She was admitted to [facility] from 09/26/2017 - 10/09/2017 with symptoms of heart failure and an E coli UTI.  She was treated with Lasix and ceftriaxone.  Echo revealed an  EF of 60%.  CXR suggested pulmonary artery hypertension.    Symptomatically, she feels "fine". She is taking hydroxyurea 25 pills/week. She continues on Coumadin as prescribed.  Exam reveals chronic right sided entropion.  Hematocrit is 38.8  Platelet count is 1,332,000.  INR is 2.86  Plan: 1.  Labs today:  CBC with diff, CMP, PT/INR. 2.  Discuss platelet goal of < 400,000. Increase  Hydroxyurea to 561m to 4 pills x 6 days week, and 3 pills x 1 day a week (total weekly dose of 27 pills). 3.  INR 2.86. Continue Coumadin 484mon 6 days a week and 68m50mn Thursdays (total weekly dose of 27 mg). 4.  Schedule for bone marrow aspirate and biopsy. 5.  D[facility], with plans to taper off her off the Hydrea.  6.  RTC after the bone marrow for MD assessment, labs (CBC with diff, CMP, PT/INR), and  +/- phlebotomy.   BryHonor LohP  12/23/2017, 2:59 PM   I saw and evaluated the patient, participating in the key portions of the service and reviewing pertinent diagnostic studies and records.  I reviewed the nurse practitioner's note and agree with the findings and the plan.  The assessment and plan were discussed with the patient.  Multiple questions were asked by the patient and answered.   MelNolon StallsD 12/23/2017, 2:59 PM

## 2017-12-23 NOTE — Progress Notes (Signed)
Patient is asking to get a port.  States she is having some diarrhea.  Otherwise, offers no complaints.

## 2017-12-24 ENCOUNTER — Encounter: Payer: Self-pay | Admitting: Urgent Care

## 2017-12-24 ENCOUNTER — Telehealth: Payer: Self-pay | Admitting: *Deleted

## 2017-12-24 NOTE — Telephone Encounter (Signed)
Attempted to call patient's grandaughter to inform her that bone marrow biopsy has been scheduled for Wednesday, February 20th.  Patient should arrive @ [place] @ 7:30 am for procedure @ 8:30 am.  Gaspar Bidding has also sent e-mail to her but it has not been read at this time.

## 2017-12-27 ENCOUNTER — Other Ambulatory Visit: Payer: Self-pay

## 2017-12-27 ENCOUNTER — Emergency Department
Admission: EM | Admit: 2017-12-27 | Discharge: 2017-12-27 | Disposition: A | Discharge status: Home / Self Care | Payer: Medicare Other | Attending: Emergency Medicine | Admitting: Emergency Medicine

## 2017-12-27 ENCOUNTER — Encounter: Payer: Self-pay | Admitting: Emergency Medicine

## 2017-12-27 DIAGNOSIS — I110 Hypertensive heart disease with heart failure: Secondary | ICD-10-CM | POA: Diagnosis not present

## 2017-12-27 DIAGNOSIS — Z96649 Presence of unspecified artificial hip joint: Secondary | ICD-10-CM | POA: Diagnosis not present

## 2017-12-27 DIAGNOSIS — R109 Unspecified abdominal pain: Secondary | ICD-10-CM | POA: Diagnosis not present

## 2017-12-27 DIAGNOSIS — I5032 Chronic diastolic (congestive) heart failure: Secondary | ICD-10-CM | POA: Insufficient documentation

## 2017-12-27 DIAGNOSIS — J449 Chronic obstructive pulmonary disease, unspecified: Secondary | ICD-10-CM | POA: Diagnosis not present

## 2017-12-27 DIAGNOSIS — Z7982 Long term (current) use of aspirin: Secondary | ICD-10-CM | POA: Diagnosis not present

## 2017-12-27 DIAGNOSIS — Z79899 Other long term (current) drug therapy: Secondary | ICD-10-CM | POA: Insufficient documentation

## 2017-12-27 DIAGNOSIS — Z7901 Long term (current) use of anticoagulants: Secondary | ICD-10-CM | POA: Diagnosis not present

## 2017-12-27 LAB — COMPREHENSIVE METABOLIC PANEL
ALBUMIN: 3.1 g/dL — AB (ref 3.5–5.0)
ALK PHOS: 69 U/L (ref 38–126)
ALT: 9 U/L — AB (ref 14–54)
AST: 23 U/L (ref 15–41)
Anion gap: 8 (ref 5–15)
BILIRUBIN TOTAL: 0.8 mg/dL (ref 0.3–1.2)
BUN: 10 mg/dL (ref 6–20)
CALCIUM: 9 mg/dL (ref 8.9–10.3)
CO2: 25 mmol/L (ref 22–32)
Chloride: 105 mmol/L (ref 101–111)
Creatinine, Ser: 0.4 mg/dL — ABNORMAL LOW (ref 0.44–1.00)
GFR calc Af Amer: >60 mL/min (ref >60)
GFR calc non Af Amer: >60 mL/min (ref >60)
GLUCOSE: 113 mg/dL — AB (ref 65–99)
Potassium: 4 mmol/L (ref 3.5–5.1)
SODIUM: 138 mmol/L (ref 135–145)
TOTAL PROTEIN: 7.6 g/dL (ref 6.5–8.1)

## 2017-12-27 LAB — CBC
HCT: 42.2 % (ref 35.0–47.0)
HEMOGLOBIN: 13.3 g/dL (ref 12.0–16.0)
MCH: 26.2 pg (ref 26.0–34.0)
MCHC: 31.6 g/dL — AB (ref 32.0–36.0)
MCV: 82.8 fL (ref 80.0–100.0)
Platelets: 889 10*3/uL — ABNORMAL HIGH (ref 150–440)
RBC: 5.1 MIL/uL (ref 3.80–5.20)
RDW: 36.5 % — AB (ref 11.5–14.5)
WBC: 2.6 10*3/uL — ABNORMAL LOW (ref 3.6–11.0)

## 2017-12-27 LAB — TROPONIN I

## 2017-12-27 LAB — LIPASE, BLOOD: Lipase: 19 U/L (ref 11–51)

## 2017-12-27 NOTE — Discharge Instructions (Signed)
You are evaluated for abdominal pain and decreased colostomy output, which is now resolved.  As we discussed, I suspect you may have had a bowel obstruction which is now relieved or gone now.  Return to emerge department immediately for any new or worsening abdominal pain, colostomy problems, fever, or any other symptoms concerning to you.

## 2017-12-27 NOTE — ED Notes (Signed)
Family states pt will need EMS transport for discharge

## 2017-12-27 NOTE — ED Notes (Signed)
Pt verbalizes d/c understaninding, VS stable, pt going back home via EMS. EMS at bedside, pt in NAD

## 2017-12-27 NOTE — ED Notes (Signed)
Pt discharge paperwork was left behind from ems trasnport. Family called and granddaughter given discharge instructions per next of kin. Family verbalizes d/c understanding and follow up

## 2017-12-27 NOTE — ED Notes (Signed)
Family called and was informed that pt was on the way home with ems at this time

## 2017-12-27 NOTE — ED Notes (Signed)
RN changed colostomy bag at this time, large amount of stool noted

## 2017-12-27 NOTE — ED Triage Notes (Addendum)
Pt to ED via EMS from home with c/o abd pain and nausea since this am. PT A&Ox4, VS stable.  Per family pt has had decreased output to colostomy bag.

## 2017-12-27 NOTE — ED Notes (Signed)
Pt unable to sign e-signature due to topaz malfnx

## 2017-12-27 NOTE — ED Provider Notes (Signed)
[facility] Emergency Department Provider Note ____________________________________________   I have reviewed the triage vital signs and the triage nursing note.  HISTORY  Chief Complaint Abdominal Pain   Historian Patient and family member  HPI Yolanda Wells is a 84 y.o. female with a history of colostomy, as well as calcium anemia vera and elevated platelet count and occasional neutropenia, presents due to no output into the colostomy bag since yesterday evening and nausea plus abdominal pain today.  Since she was in the ambulance ride she has now had output into the ostomy bag, and no more pain.  No fevers.  No vomiting.  Pain was severe, currently gone/resolved.   Past Medical History:  Diagnosis Date  . Acute deep vein thrombosis (DVT) of distal vein of left lower extremity ([place]) 10/03/2015  . Acute pulmonary embolism ([place]) 10/03/2015  . Anticoagulant long-term use   . Arthritis   . CHF (congestive heart failure) ([place])   . Chronic anticoagulation 10/03/2015  . Collagen vascular disease ([place])   . Colostomy in place [facility]) 10/04/2015  . Deep venous thrombosis (HCC)    right lower extremity  . Dependent edema   . Diverticulosis of intestine with bleeding 04/02/2015  . Glenohumeral arthritis 06/28/2012  . Hammertoe 09/02/2012  . History of bilateral hip replacements 09/02/2012  . History of hysterectomy   . Hypertension   . Hypokalemia   . Inability to ambulate due to hip 10/12/2015  . Mandibular dysfunction   . Onychomycosis 09/02/2012  . Osteoarthritis   . Polycythemia vera ([place])   . Presence of IVC filter 05/25/2015  . Stroke ([place]) 03/26/2015   cerebellar  . Urinary incontinence in female 09/02/2016    Patient Active Problem List   Diagnosis Date Noted  . Goals of care, counseling/discussion 12/23/2017  . Palliative care by specialist   . DNR (do not resuscitate) discussion   . Weakness generalized   . Hypoxia 10/13/2017  . Acute  CHF (congestive heart failure) ([place]) 10/06/2017  . Chronic diastolic CHF (congestive heart failure) ([place]) 07/28/2017  . Dyspnea 05/27/2017  . Ileus ([place]) 04/10/2017  . Influenza with respiratory manifestation 12/01/2016  . Respiratory distress 11/23/2016  . Urinary incontinence in female 09/02/2016  . Vision loss of right eye 08/26/2016  . Blindness of right eye 08/26/2016  . Inability to ambulate due to hip 10/12/2015  . Colostomy in place [facility]) 10/04/2015  . Chronic anticoagulation 10/03/2015  . Acute pulmonary embolism ([place]) 10/03/2015  . Leukocytosis 09/25/2015  . Chest pain 09/25/2015  . COPD exacerbation ([place]) 09/24/2015  . CAP (community acquired pneumonia) 09/24/2015  . COPD (chronic obstructive pulmonary disease) ([place]) 09/24/2015  . SOB (shortness of breath) 09/22/2015  . Elevated troponin 09/15/2015  . UTI (urinary tract infection) 07/16/2015  . Abdominal wall cellulitis 07/15/2015  . DVT (deep venous thrombosis) ([place]) 07/15/2015  . HTN (hypertension) 07/15/2015  . Polycythemia vera ([place]) 07/15/2015  . Dependent edema 07/15/2015  . Arthritis 07/15/2015  . Pulmonary emboli ([place]) 05/25/2015  . Diverticulosis of colon with hemorrhage 04/02/2015  . Diverticulosis of intestine with bleeding 04/02/2015  . Dehydration, moderate 01/11/2015  . Fall 01/10/2015  . Osteoarthritis 01/10/2015  . Hammertoe 09/02/2012  . S/P transmetatarsal amputation of foot ([place]) 09/02/2012  . Onychomycosis 09/02/2012  . Other specified dermatoses 09/02/2012  . Hip pain 08/23/2012  . S/P hip replacement 08/23/2012  . Glenohumeral arthritis 06/28/2012    Past Surgical History:  Procedure Laterality Date  . ABDOMINAL HYSTERECTOMY    . BLEPHAROPLASTY  Right 03/2017   right upper eyelid  . CARDIAC CATHETERIZATION Right 04/03/2015   Procedure: CENTRAL LINE INSERTION;  Surgeon: Sherri Rad, MD;  Location: [facility] ORS;  Service: General;  Laterality: Right;  . COLECTOMY WITH COLOSTOMY CREATION/HARTMANN  PROCEDURE N/A 04/03/2015   Procedure: COLECTOMY WITH COLOSTOMY CREATION/HARTMANN PROCEDURE;  Surgeon: Sherri Rad, MD;  Location: [facility] ORS;  Service: General;  Laterality: N/A;  . COLON SURGERY    . FOOT AMPUTATION     partial  . FOOT AMPUTATION Right   . JOINT REPLACEMENT     Left and Right Hip  . PERIPHERAL VASCULAR CATHETERIZATION N/A 04/02/2015   Procedure: Visceral Angiography;  Surgeon: Algernon Huxley, MD;  Location: [place] CV LAB;  Service: Cardiovascular;  Laterality: N/A;  . PERIPHERAL VASCULAR CATHETERIZATION N/A 04/02/2015   Procedure: Visceral Artery Intervention;  Surgeon: Algernon Huxley, MD;  Location: [place] [facility];  Service: Cardiovascular;  Laterality: N/A;  . PERIPHERAL VASCULAR CATHETERIZATION N/A 05/25/2015   Procedure: IVC Filter Insertion;  Surgeon: Katha Cabal, MD;  Location: [place] CV LAB;  Service: Cardiovascular;  Laterality: N/A;  . TOE AMPUTATION     right  . TOTAL HIP ARTHROPLASTY      Prior to Admission medications   Medication Sig Start Date End Date Taking? Authorizing Provider  amLODipine (NORVASC) 5 MG tablet Take 5 mg by mouth daily.    [provider]  aspirin EC 81 MG tablet Take 1 tablet (81 mg total) by mouth daily. 09/16/15   [name], MD  docusate sodium (COLACE) 100 MG capsule Take 100 mg by mouth 2 (two) times daily.     [provider]  ENSURE (ENSURE) Take 1 Can by mouth 3 (three) times daily with meals.    [provider]  folic acid (FOLVITE) 790 MCG tablet Take 400 mcg by mouth daily.    [provider]  hydroxyurea (HYDREA) 500 MG capsule TAKE FOUR CAPSULES BY MOUTH DAILY 10/19/17   Nolon Stalls C, MD  ipratropium-albuterol (DUONEB) 0.5-2.5 (3) MG/3ML SOLN Take 3 mLs by nebulization every 4 (four) hours as needed.    [provider]  LORazepam (ATIVAN) 0.5 MG tablet Take 0.5 mg by mouth every 4 (four) hours as needed for anxiety.    [provider]   polyethylene glycol (MIRALAX / GLYCOLAX) packet Take 17 g by mouth daily.    [provider]  potassium chloride (K-DUR,KLOR-CON) 10 MEQ tablet Take 10 mEq by mouth 2 (two) times a week. On Monday and Thursday  With lasix for potassium replacement    [provider]  torsemide (DEMADEX) 10 MG tablet Take 10 mg by mouth daily.    [provider]  triamcinolone cream (KENALOG) 0.1 % Apply 1 application topically 2 (two) times daily. To affected area    [provider]  warfarin (COUMADIN) 3 MG tablet TAKE 1 TABLET BY MOUTH ALONG WITH A 1MG  TABLET FOR A TOTAL DOSE OF 4MG  4 DAYS A WEEK. TAKE 3.5MG  BY MOUTH 3 DAYS A WEEK 12/07/17   Karen Kitchens, NP  warfarin (COUMADIN) 4 MG tablet 6 days a week 12/04/17   Lequita Asal, MD    Allergies  Allergen Reactions  . Sulfamethoxazole-Trimethoprim Nausea And Vomiting    Family History  Problem Relation Age of Onset  . Brain cancer Mother   . Heart attack Father   . COPD Father   . Coronary artery disease Father   . Hypertension Unknown   .  Arthritis-Osteo Unknown   . Diabetes Brother   . Arthritis Brother   . Osteosarcoma Son   . Bone cancer Son   . Arthritis Sister   . Arthritis Brother   . Hypertension Brother   . Coronary artery disease Brother     Social History Social History   Tobacco Use  . Smoking status: Never Smoker  . Smokeless tobacco: Never Used  Substance Use Topics  . Alcohol use: No  . Drug use: No    Review of Systems  Constitutional: Negative for fever. Eyes: Negative for visual changes. ENT: Negative for sore throat. Cardiovascular: Negative for chest pain. Respiratory: Negative for shortness of breath. Gastrointestinal: As per HPI, she was having abdominal pain and nausea and decreased output from her colostomy, currently resolved. Genitourinary: Negative for dysuria. Musculoskeletal: Negative for back pain. Skin: Negative for rash. Neurological: Negative for  headache.  ____________________________________________   PHYSICAL EXAM:  VITAL SIGNS: ED Triage Vitals  Enc Vitals Group     BP --      Pulse Rate 12/27/17 1404 77     Resp 12/27/17 1404 16     Temp --      Temp Source 12/27/17 1404 Oral     SpO2 12/27/17 1409 98 %     Weight --      Height --      Head Circumference --      Peak Flow --      Pain Score 12/27/17 1406 0     Pain Loc --      Pain Edu? --      Excl. in [place]? --      Constitutional: Alert and oriented. Well appearing and in no distress. HEENT   Head: Normocephalic and atraumatic.      Eyes: Conjunctivae are normal. Pupils equal and round.       Ears:         Nose: No congestion/rhinnorhea.   Mouth/Throat: Mucous membranes are moist.   Neck: No stridor. Cardiovascular/Chest: Normal rate, regular rhythm.  No murmurs, rubs, or gallops. Respiratory: Normal respiratory effort without tachypnea nor retractions. Breath sounds are clear and equal bilaterally. No wheezes/rales/rhonchi. Gastrointestinal: Soft. No distention, no guarding, no rebound. Nontender.    Genitourinary/rectal:Deferred Musculoskeletal: Nontender with normal range of motion in all extremities. No joint effusions.  No lower extremity tenderness.  No edema. Neurologic:  Normal speech and language. No gross or focal neurologic deficits are appreciated. Skin:  Skin is warm, dry and intact. No rash noted. Psychiatric: Mood and affect are normal. Speech and behavior are normal. Patient exhibits appropriate insight and judgment.   ____________________________________________  LABS (pertinent positives/negatives) I, Lisa Roca, MD the attending physician have reviewed the labs noted below.  Labs Reviewed  CBC - Abnormal; Notable for the following components:      Result Value   WBC 2.6 (*)    MCHC 31.6 (*)    RDW 36.5 (*)    Platelets 889 (*)    All other components within normal limits  LIPASE, BLOOD  COMPREHENSIVE METABOLIC  PANEL  URINALYSIS, COMPLETE (UACMP) WITH MICROSCOPIC  TROPONIN I   ____________________________________  PROCEDURES  Procedure(s) performed: None  Critical Care performed: None   ____________________________________________  ED COURSE / ASSESSMENT AND PLAN  Pertinent labs & imaging results that were available during my care of the patient were reviewed by me and considered in my medical decision making (see chart for details).    Sounds like patient was was having  symptoms of a bowel obstruction prior to arrival with decreased or no output into her colostomy bag since last night, with nausea and abdominal pain, and symptoms resolved during the ambulance ride over here.  She currently has no symptoms.  She has chronic low white blood cell count, chronic elevated platelets.  We will await send laboratory studies based off of protocol, and if no recurrence of any symptoms, plan to discharge patient home.  Patient care transferred to Dr. Mable Paris at shift change 3 PM, I have prepared patient's discharge instructions.  Labs are pending, but if no recurrent symptoms and no change in disposition, patient may be discharged with my prepared discharge instructions.    CONSULTATIONS:   None   Patient / Family / Caregiver informed of clinical course, medical decision-making process, and agree with plan.   I discussed return precautions, follow-up instructions, and discharge instructions with patient and/or family.  Discharge Instructions : You are evaluated for abdominal pain and decreased colostomy output, which is now resolved.  As we discussed, I suspect you may have had a bowel obstruction which is now relieved or gone now.  Return to emerge department immediately for any new or worsening abdominal pain, colostomy problems, fever, or any other symptoms concerning to you.    ___________________________________________   FINAL CLINICAL IMPRESSION(S) / ED DIAGNOSES   Final  diagnoses:  Abdominal pain, unspecified abdominal location      ___________________________________________        Note: This dictation was prepared with Dragon dictation. Any transcriptional errors that result from this process are unintentional    Lisa Roca, MD 12/27/17 1456

## 2017-12-29 ENCOUNTER — Encounter: Payer: Self-pay | Admitting: Emergency Medicine

## 2017-12-29 ENCOUNTER — Emergency Department
Admission: EM | Admit: 2017-12-29 | Discharge: 2017-12-29 | Disposition: A | Discharge status: Home / Self Care | Payer: Medicare Other | Attending: Emergency Medicine | Admitting: Emergency Medicine

## 2017-12-29 ENCOUNTER — Other Ambulatory Visit: Payer: Self-pay

## 2017-12-29 DIAGNOSIS — Z96643 Presence of artificial hip joint, bilateral: Secondary | ICD-10-CM | POA: Insufficient documentation

## 2017-12-29 DIAGNOSIS — J449 Chronic obstructive pulmonary disease, unspecified: Secondary | ICD-10-CM | POA: Insufficient documentation

## 2017-12-29 DIAGNOSIS — R300 Dysuria: Secondary | ICD-10-CM | POA: Diagnosis present

## 2017-12-29 DIAGNOSIS — I110 Hypertensive heart disease with heart failure: Secondary | ICD-10-CM | POA: Diagnosis not present

## 2017-12-29 DIAGNOSIS — I10 Essential (primary) hypertension: Secondary | ICD-10-CM | POA: Diagnosis not present

## 2017-12-29 DIAGNOSIS — N3000 Acute cystitis without hematuria: Secondary | ICD-10-CM | POA: Insufficient documentation

## 2017-12-29 DIAGNOSIS — I5032 Chronic diastolic (congestive) heart failure: Secondary | ICD-10-CM | POA: Insufficient documentation

## 2017-12-29 LAB — CBC WITH DIFFERENTIAL/PLATELET
BASOS ABS: 0.1 10*3/uL (ref 0–0.1)
BASOS PCT: 4 %
Eosinophils Absolute: 0.1 10*3/uL (ref 0–0.7)
Eosinophils Relative: 5 %
HEMATOCRIT: 39.1 % (ref 35.0–47.0)
HEMOGLOBIN: 12.7 g/dL (ref 12.0–16.0)
LYMPHS PCT: 44 %
Lymphs Abs: 1.2 10*3/uL (ref 1.0–3.6)
MCH: 26.8 pg (ref 26.0–34.0)
MCHC: 32.4 g/dL (ref 32.0–36.0)
MCV: 82.7 fL (ref 80.0–100.0)
MONOS PCT: 4 %
Monocytes Absolute: 0.1 10*3/uL — ABNORMAL LOW (ref 0.2–0.9)
NEUTROS ABS: 1.1 10*3/uL — AB (ref 1.4–6.5)
NEUTROS PCT: 43 %
Platelets: 637 10*3/uL — ABNORMAL HIGH (ref 150–440)
RBC: 4.73 MIL/uL (ref 3.80–5.20)
RDW: 36.3 % — ABNORMAL HIGH (ref 11.5–14.5)
WBC: 2.6 10*3/uL — ABNORMAL LOW (ref 3.6–11.0)

## 2017-12-29 LAB — COMPREHENSIVE METABOLIC PANEL
ALBUMIN: 2.8 g/dL — AB (ref 3.5–5.0)
ALK PHOS: 64 U/L (ref 38–126)
ALT: 9 U/L — ABNORMAL LOW (ref 14–54)
AST: 20 U/L (ref 15–41)
Anion gap: 6 (ref 5–15)
BUN: 12 mg/dL (ref 6–20)
CALCIUM: 8.5 mg/dL — AB (ref 8.9–10.3)
CO2: 25 mmol/L (ref 22–32)
Chloride: 107 mmol/L (ref 101–111)
Creatinine, Ser: 0.58 mg/dL (ref 0.44–1.00)
GFR calc Af Amer: >60 mL/min (ref >60)
GFR calc non Af Amer: >60 mL/min (ref >60)
GLUCOSE: 95 mg/dL (ref 65–99)
Potassium: 4.4 mmol/L (ref 3.5–5.1)
Sodium: 138 mmol/L (ref 135–145)
Total Bilirubin: 0.8 mg/dL (ref 0.3–1.2)
Total Protein: 7.2 g/dL (ref 6.5–8.1)

## 2017-12-29 LAB — URINALYSIS, COMPLETE (UACMP) WITH MICROSCOPIC
Bilirubin Urine: NEGATIVE
Glucose, UA: NEGATIVE mg/dL
Hgb urine dipstick: NEGATIVE
Ketones, ur: NEGATIVE mg/dL
Nitrite: POSITIVE — AB
PH: 7 (ref 5.0–8.0)
Protein, ur: NEGATIVE mg/dL
SPECIFIC GRAVITY, URINE: 1.012 (ref 1.005–1.030)

## 2017-12-29 MED ORDER — SODIUM CHLORIDE 0.9 % IV SOLN
1.0000 g | Freq: Once | INTRAVENOUS | Status: AC
  Administered 2017-12-29: 1 g via INTRAVENOUS
  Filled 2017-12-29: qty 10

## 2017-12-29 MED ORDER — CEPHALEXIN 500 MG PO CAPS: 500.0000 mg | ORAL_CAPSULE | Freq: Four times a day (QID) | ORAL | 0 refills | Status: AC

## 2017-12-29 NOTE — ED Triage Notes (Signed)
Pt in via [facility] from home with complaints of dysuria, hematuria x one day.  Pt A/Ox4, vitals WDL, NAD noted at this time.

## 2017-12-29 NOTE — ED Notes (Signed)
Granddaughter unable to transport patient home and no other rides available.  Holley Raring, Network engineer aware and called for EMS transport.

## 2017-12-29 NOTE — ED Provider Notes (Signed)
[facility] Emergency Department Provider Note       Time seen: ----------------------------------------- 9:47 AM on 12/29/2017 -----------------------------------------   I have reviewed the triage vital signs and the nursing notes.  HISTORY   Chief Complaint No chief complaint on file.    HPI Yolanda Wells is a 83 y.o. female with a history of DVT, PE, CHF, collagen vascular disease, hypertension, hypokalemia, osteoarthritis who presents to the ED for dysuria.  Reportedly she has had difficulty urinating for several days and pain with urination.  She was recently hospitalized for a bowel obstruction but states her ostomy is working well now.  She denies any other complaints at this time.  Past Medical History:  Diagnosis Date  . Acute deep vein thrombosis (DVT) of distal vein of left lower extremity ([place]) 10/03/2015  . Acute pulmonary embolism ([place]) 10/03/2015  . Anticoagulant long-term use   . Arthritis   . CHF (congestive heart failure) ([place])   . Chronic anticoagulation 10/03/2015  . Collagen vascular disease ([place])   . Colostomy in place [facility]) 10/04/2015  . Deep venous thrombosis (HCC)    right lower extremity  . Dependent edema   . Diverticulosis of intestine with bleeding 04/02/2015  . Glenohumeral arthritis 06/28/2012  . Hammertoe 09/02/2012  . History of bilateral hip replacements 09/02/2012  . History of hysterectomy   . Hypertension   . Hypokalemia   . Inability to ambulate due to hip 10/12/2015  . Mandibular dysfunction   . Onychomycosis 09/02/2012  . Osteoarthritis   . Polycythemia vera ([place])   . Presence of IVC filter 05/25/2015  . Stroke ([place]) 03/26/2015   cerebellar  . Urinary incontinence in female 09/02/2016    Patient Active Problem List   Diagnosis Date Noted  . Goals of care, counseling/discussion 12/23/2017  . Palliative care by specialist   . DNR (do not resuscitate) discussion   . Weakness generalized   .  Hypoxia 10/13/2017  . Acute CHF (congestive heart failure) ([place]) 10/06/2017  . Chronic diastolic CHF (congestive heart failure) ([place]) 07/28/2017  . Dyspnea 05/27/2017  . Ileus ([place]) 04/10/2017  . Influenza with respiratory manifestation 12/01/2016  . Respiratory distress 11/23/2016  . Urinary incontinence in female 09/02/2016  . Vision loss of right eye 08/26/2016  . Blindness of right eye 08/26/2016  . Inability to ambulate due to hip 10/12/2015  . Colostomy in place [facility]) 10/04/2015  . Chronic anticoagulation 10/03/2015  . Acute pulmonary embolism ([place]) 10/03/2015  . Leukocytosis 09/25/2015  . Chest pain 09/25/2015  . COPD exacerbation ([place]) 09/24/2015  . CAP (community acquired pneumonia) 09/24/2015  . COPD (chronic obstructive pulmonary disease) ([place]) 09/24/2015  . SOB (shortness of breath) 09/22/2015  . Elevated troponin 09/15/2015  . UTI (urinary tract infection) 07/16/2015  . Abdominal wall cellulitis 07/15/2015  . DVT (deep venous thrombosis) ([place]) 07/15/2015  . HTN (hypertension) 07/15/2015  . Polycythemia vera ([place]) 07/15/2015  . Dependent edema 07/15/2015  . Arthritis 07/15/2015  . Pulmonary emboli ([place]) 05/25/2015  . Diverticulosis of colon with hemorrhage 04/02/2015  . Diverticulosis of intestine with bleeding 04/02/2015  . Dehydration, moderate 01/11/2015  . Fall 01/10/2015  . Osteoarthritis 01/10/2015  . Hammertoe 09/02/2012  . S/P transmetatarsal amputation of foot ([place]) 09/02/2012  . Onychomycosis 09/02/2012  . Other specified dermatoses 09/02/2012  . Hip pain 08/23/2012  . S/P hip replacement 08/23/2012  . Glenohumeral arthritis 06/28/2012    Past Surgical History:  Procedure Laterality Date  . ABDOMINAL HYSTERECTOMY    .  BLEPHAROPLASTY Right 03/2017   right upper eyelid  . CARDIAC CATHETERIZATION Right 04/03/2015   Procedure: CENTRAL LINE INSERTION;  Surgeon: Sherri Rad, MD;  Location: [facility] ORS;  Service: General;  Laterality: Right;  . COLECTOMY  WITH COLOSTOMY CREATION/HARTMANN PROCEDURE N/A 04/03/2015   Procedure: COLECTOMY WITH COLOSTOMY CREATION/HARTMANN PROCEDURE;  Surgeon: Sherri Rad, MD;  Location: [facility] ORS;  Service: General;  Laterality: N/A;  . COLON SURGERY    . FOOT AMPUTATION     partial  . FOOT AMPUTATION Right   . JOINT REPLACEMENT     Left and Right Hip  . PERIPHERAL VASCULAR CATHETERIZATION N/A 04/02/2015   Procedure: Visceral Angiography;  Surgeon: Algernon Huxley, MD;  Location: [place] CV LAB;  Service: Cardiovascular;  Laterality: N/A;  . PERIPHERAL VASCULAR CATHETERIZATION N/A 04/02/2015   Procedure: Visceral Artery Intervention;  Surgeon: Algernon Huxley, MD;  Location: [place] CV LAB;  Service: Cardiovascular;  Laterality: N/A;  . PERIPHERAL VASCULAR CATHETERIZATION N/A 05/25/2015   Procedure: IVC Filter Insertion;  Surgeon: Katha Cabal, MD;  Location: [place] CV LAB;  Service: Cardiovascular;  Laterality: N/A;  . TOE AMPUTATION     right  . TOTAL HIP ARTHROPLASTY      Allergies Sulfamethoxazole-trimethoprim  Social History Social History   Tobacco Use  . Smoking status: Never Smoker  . Smokeless tobacco: Never Used  Substance Use Topics  . Alcohol use: No  . Drug use: No    Review of Systems Constitutional: Negative for fever. Cardiovascular: Negative for chest pain. Respiratory: Negative for shortness of breath. Gastrointestinal: Negative for abdominal pain, vomiting and diarrhea. Genitourinary: Positive for dysuria Musculoskeletal: Negative for back pain. Skin: Negative for rash. Neurological: Negative for headaches, focal weakness or numbness.  All systems negative/normal/unremarkable except as stated in the HPI  ____________________________________________   PHYSICAL EXAM:  VITAL SIGNS: ED Triage Vitals  Enc Vitals Group     BP      Pulse      Resp      Temp      Temp src      SpO2      Weight      Height      Head Circumference      Peak Flow      Pain  Score      Pain Loc      Pain Edu?      Excl. in [place]?     Constitutional: Alert and oriented. Well appearing and in no distress. Eyes: Conjunctivae are normal. Normal extraocular movements. ENT   Head: Normocephalic and atraumatic.   Nose: No congestion/rhinnorhea.   Mouth/Throat: Mucous membranes are moist.   Neck: No stridor. Cardiovascular: Normal rate, regular rhythm. No murmurs, rubs, or gallops. Respiratory: Mild tachypnea but clear breath sounds Gastrointestinal: Soft and nontender. Normal bowel sounds.  Colostomy site appears unremarkable Musculoskeletal: Nontender with normal range of motion in extremities. No lower extremity tenderness nor edema. Neurologic:  Normal speech and language. No gross focal neurologic deficits are appreciated.  Skin:  Skin is warm, dry and intact. No rash noted. Psychiatric: Mood and affect are normal. Speech and behavior are normal.  ____________________________________________  ED COURSE:  As part of my medical decision making, I reviewed the following data within the [place] History obtained from family if available, nursing notes, old chart and ekg, as well as notes from prior ED visits. Patient presented for dysuria, we will assess with labs and imaging as indicated at  this time.   Procedures ____________________________________________   LABS (pertinent positives/negatives)  Labs Reviewed  CBC WITH DIFFERENTIAL/PLATELET - Abnormal; Notable for the following components:      Result Value   WBC 2.6 (*)    RDW 36.3 (*)    Platelets 637 (*)    Neutro Abs 1.1 (*)    Monocytes Absolute 0.1 (*)    All other components within normal limits  COMPREHENSIVE METABOLIC PANEL - Abnormal; Notable for the following components:   Calcium 8.5 (*)    Albumin 2.8 (*)    ALT 9 (*)    All other components within normal limits  URINALYSIS, COMPLETE (UACMP) WITH MICROSCOPIC - Abnormal; Notable for the following  components:   Color, Urine YELLOW (*)    APPearance HAZY (*)    Nitrite POSITIVE (*)    Leukocytes, UA MODERATE (*)    Bacteria, UA MANY (*)    Squamous Epithelial / LPF 6-30 (*)    All other components within normal limits  URINE CULTURE    RADIOLOGY Images were viewed by me  Bladder scan Did not reveal urinary retention ____________________________________________  DIFFERENTIAL DIAGNOSIS   UTI, urinary retention, renal failure, dehydration, electrolyte abnormality, sepsis  FINAL ASSESSMENT AND PLAN  UTI   Plan: Patient had presented for dysuria. Patient's labs are unremarkable other than for UTI.  She appears to have a chronic mild leukopenia.  She does have a history of UTI's and typically grows out E. coli that is sensitive to first generation cephalosporins.  Patient's imaging does not reveal urinary retention.  It is unclear why she chronically seems to develop urinary tract infections.  I will refer her to urology for outpatient follow-up.   Laurence Aly, MD   Note: This note was generated in part or whole with voice recognition software. Voice recognition is usually quite accurate but there are transcription errors that can and very often do occur. I apologize for any typographical errors that were not detected and corrected.     Earleen Newport, MD 12/29/17 [phone]

## 2017-12-30 ENCOUNTER — Ambulatory Visit: Admission: RE | Admit: 2017-12-30 | Payer: Medicare Other | Source: Ambulatory Visit

## 2017-12-31 LAB — URINE CULTURE
Culture: >=100000 — AB
Special Requests: NORMAL

## 2018-01-02 ENCOUNTER — Other Ambulatory Visit: Payer: Self-pay | Admitting: Hematology and Oncology

## 2018-01-02 DIAGNOSIS — D45 Polycythemia vera: Secondary | ICD-10-CM

## 2018-01-06 ENCOUNTER — Other Ambulatory Visit: Payer: Self-pay | Admitting: Radiology

## 2018-01-07 ENCOUNTER — Ambulatory Visit
Admission: RE | Admit: 2018-01-07 | Discharge: 2018-01-07 | Disposition: A | Discharge status: Home / Self Care | Payer: Medicare Other | Source: Ambulatory Visit | Attending: Urgent Care | Admitting: Urgent Care

## 2018-01-07 ENCOUNTER — Other Ambulatory Visit (HOSPITAL_COMMUNITY)
Admission: RE | Admit: 2018-01-07 | Disposition: A | Discharge status: Home / Self Care | Payer: Medicare Other | Source: Ambulatory Visit | Attending: Hematology and Oncology | Admitting: Hematology and Oncology

## 2018-01-07 ENCOUNTER — Other Ambulatory Visit: Payer: Self-pay | Admitting: Urgent Care

## 2018-01-07 DIAGNOSIS — I509 Heart failure, unspecified: Secondary | ICD-10-CM | POA: Insufficient documentation

## 2018-01-07 DIAGNOSIS — D45 Polycythemia vera: Secondary | ICD-10-CM | POA: Insufficient documentation

## 2018-01-07 DIAGNOSIS — Z86718 Personal history of other venous thrombosis and embolism: Secondary | ICD-10-CM | POA: Diagnosis not present

## 2018-01-07 DIAGNOSIS — M1990 Unspecified osteoarthritis, unspecified site: Secondary | ICD-10-CM | POA: Insufficient documentation

## 2018-01-07 DIAGNOSIS — Z8673 Personal history of transient ischemic attack (TIA), and cerebral infarction without residual deficits: Secondary | ICD-10-CM | POA: Diagnosis not present

## 2018-01-07 DIAGNOSIS — Z933 Colostomy status: Secondary | ICD-10-CM | POA: Diagnosis not present

## 2018-01-07 DIAGNOSIS — Z882 Allergy status to sulfonamides status: Secondary | ICD-10-CM | POA: Insufficient documentation

## 2018-01-07 DIAGNOSIS — Z7982 Long term (current) use of aspirin: Secondary | ICD-10-CM | POA: Diagnosis not present

## 2018-01-07 DIAGNOSIS — I110 Hypertensive heart disease with heart failure: Secondary | ICD-10-CM | POA: Insufficient documentation

## 2018-01-07 DIAGNOSIS — Z86711 Personal history of pulmonary embolism: Secondary | ICD-10-CM | POA: Insufficient documentation

## 2018-01-07 DIAGNOSIS — Z79899 Other long term (current) drug therapy: Secondary | ICD-10-CM | POA: Diagnosis not present

## 2018-01-07 DIAGNOSIS — Z7901 Long term (current) use of anticoagulants: Secondary | ICD-10-CM | POA: Insufficient documentation

## 2018-01-07 LAB — CBC WITH DIFFERENTIAL/PLATELET
Basophils Absolute: 0.1 10*3/uL (ref 0–0.1)
Basophils Relative: 1 %
EOS PCT: 2 %
Eosinophils Absolute: 0.1 10*3/uL (ref 0–0.7)
HCT: 44.1 % (ref 35.0–47.0)
Hemoglobin: 13.7 g/dL (ref 12.0–16.0)
LYMPHS ABS: 1.4 10*3/uL (ref 1.0–3.6)
LYMPHS PCT: 22 %
MCH: 26.1 pg (ref 26.0–34.0)
MCHC: 31.1 g/dL — AB (ref 32.0–36.0)
MCV: 83.9 fL (ref 80.0–100.0)
MONOS PCT: 3 %
Monocytes Absolute: 0.2 10*3/uL (ref 0.2–0.9)
Neutro Abs: 4.6 10*3/uL (ref 1.4–6.5)
Neutrophils Relative %: 72 %
PLATELETS: 1168 10*3/uL — AB (ref 150–440)
RBC: 5.25 MIL/uL — ABNORMAL HIGH (ref 3.80–5.20)
RDW: 36.7 % — AB (ref 11.5–14.5)
WBC: 6.4 10*3/uL (ref 3.6–11.0)

## 2018-01-07 LAB — PROTIME-INR
INR: 1.59
Prothrombin Time: 18.8 seconds — ABNORMAL HIGH (ref 11.4–15.2)

## 2018-01-07 MED ORDER — HYDROCODONE-ACETAMINOPHEN 5-325 MG PO TABS: 1.0000 | ORAL_TABLET | ORAL | Status: DC | PRN

## 2018-01-07 MED ORDER — LIDOCAINE HCL (PF) 1 % IJ SOLN
INTRAMUSCULAR | Status: AC | PRN
  Administered 2018-01-07: 10 mL

## 2018-01-07 MED ORDER — FENTANYL CITRATE (PF) 100 MCG/2ML IJ SOLN
INTRAMUSCULAR | Status: AC
  Filled 2018-01-07: qty 4

## 2018-01-07 MED ORDER — HEPARIN SOD (PORK) LOCK FLUSH 100 UNIT/ML IV SOLN
INTRAVENOUS | Status: AC
  Filled 2018-01-07: qty 5

## 2018-01-07 MED ORDER — MIDAZOLAM HCL 5 MG/5ML IJ SOLN
INTRAMUSCULAR | Status: AC | PRN
  Administered 2018-01-07 (×2): 1 mg via INTRAVENOUS

## 2018-01-07 MED ORDER — SODIUM CHLORIDE 0.9 % IV SOLN
INTRAVENOUS | Status: DC
Start: 2018-01-07 — End: 2018-01-08
  Administered 2018-01-07: 09:00:00 via INTRAVENOUS

## 2018-01-07 MED ORDER — MIDAZOLAM HCL 5 MG/5ML IJ SOLN
INTRAMUSCULAR | Status: AC
  Filled 2018-01-07: qty 5

## 2018-01-07 MED ORDER — FENTANYL CITRATE (PF) 100 MCG/2ML IJ SOLN
INTRAMUSCULAR | Status: AC | PRN
  Administered 2018-01-07: 50 ug via INTRAVENOUS

## 2018-01-07 NOTE — H&P (Signed)
Chief Complaint: Patient was seen in consultation today for polycythemia, leukocytosis, thrombocytosis at the request of [facility]  Referring Physician(s): Honor Loh NP  Supervising Physician: Arne Cleveland  Patient Status: [facility] - Out-pt  History of Present Illness: Yolanda Wells is a 84 y.o. female   with JAK2+ polycythemia rubra vera (PV) previously on a phlebotomy program and hydroxyurea. She received P32 in an attempt to manage her counts in 03/2015 and on 05/14/2017.  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 She was diagnosed with bilateral lower extremity DVTs on 05/18/2015 and bilateral pulmonary emboli on 05/24/2015. She underwent IVC filter placement on 05/25/2015.  She has been on a fluctuating dose of Coumadin secondary to unstable INR.  She is on a baby aspirin.  She developed progressive erythrocytosis, thrombocytosis, and leukocytosis.  She underwent phlebotomy for a hematocrit of 55.4 on 09/23/2015.  Hematocrit decreased to 50.0, but has again increased after initiation of oral iron.  Platelet count has increased from 1.1 million to 1.4 million.  White count increased from 23,000 - 28,000 to 31,800.  She was admitted on 08/25/2016 with loss of vision in her right eye.  CBC on 08/25/2016 revealed a hematocrit of 40.0, hemoglobin 13.5, MCV 91, platelets 842,000, WBC 4900.  INR was 2.59.  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  MRI and MRA showed no acute changes.  She has been back on hydroxyurea since 10/02/2015.  Initial dose was 1000 mg a day.  She is currently taking 4 pills 4 days/week and 3 pills 3 days/week (total weekly dose: 24 pills).   She requires periodic phlebotomies (goal hematocrit <= 42).  Platelet count remains elevated (secondary to PV and likely some component of iron deficiency).  Goal  platelet count is 400,000.  She is on Coumadin 4 mg daily  (total weekly dose 28 mg).  INR goal 2-3.  She was admitted to [facility] for 2.5 weeks with pneumonia then returned 07/03/2017 - 07/12/2017 with CHF.  She was admitted to [facility] from 09/26/2017 - 10/09/2017 with symptoms of heart failure and an E coli UTI.  She was treated with Lasix and ceftriaxone. Echo revealed an EF of 60%. CXR suggested pulmonary artery hypertension.   Symptomatically, she feels "fine". She is taking hydroxyurea 25 pills/week. She continues on her Coumadin as prescribed.  Exam reveals chronic right sided entropion.  Hematocrit is 38.8  Platelet count is 1,332,000.  INR is 2.86  Send for Bone marrow biopsy.   Past Medical History:  Diagnosis Date  . Acute deep vein thrombosis (DVT) of distal vein of left lower extremity ([place]) 10/03/2015  . Acute pulmonary embolism ([place]) 10/03/2015  . Anticoagulant long-term use   . Arthritis   . CHF (congestive heart failure) ([place])   . Chronic anticoagulation 10/03/2015  . Collagen vascular disease ([place])   . Colostomy in place [facility]) 10/04/2015  . Deep venous thrombosis ([facility])    right lower extremity  . Dependent edema   . Diverticulosis of intestine with bleeding 04/02/2015  . Glenohumeral arthritis 06/28/2012  . Hammertoe 09/02/2012  . History of bilateral hip replacements 09/02/2012  . History of hysterectomy   . Hypertension   . Hypokalemia   . Inability to ambulate due to hip 10/12/2015  . Mandibular dysfunction   . Onychomycosis 09/02/2012  . Osteoarthritis   . Polycythemia vera ([place])   . Presence of IVC filter 05/25/2015  .  Stroke ([place]) 03/26/2015   cerebellar  . Urinary incontinence in female 09/02/2016    Past Surgical History:  Procedure Laterality Date  . ABDOMINAL HYSTERECTOMY    . BLEPHAROPLASTY Right 03/2017   right upper eyelid  . CARDIAC CATHETERIZATION Right 04/03/2015   Procedure: CENTRAL LINE INSERTION;  Surgeon: Sherri Rad,  MD;  Location: [facility] ORS;  Service: General;  Laterality: Right;  . COLECTOMY WITH COLOSTOMY CREATION/HARTMANN PROCEDURE N/A 04/03/2015   Procedure: COLECTOMY WITH COLOSTOMY CREATION/HARTMANN PROCEDURE;  Surgeon: Sherri Rad, MD;  Location: [facility] ORS;  Service: General;  Laterality: N/A;  . COLON SURGERY    . FOOT AMPUTATION     partial  . FOOT AMPUTATION Right   . JOINT REPLACEMENT     Left and Right Hip  . PERIPHERAL VASCULAR CATHETERIZATION N/A 04/02/2015   Procedure: Visceral Angiography;  Surgeon: Algernon Huxley, MD;  Location: [place] CV LAB;  Service: Cardiovascular;  Laterality: N/A;  . PERIPHERAL VASCULAR CATHETERIZATION N/A 04/02/2015   Procedure: Visceral Artery Intervention;  Surgeon: Algernon Huxley, MD;  Location: [place] [facility];  Service: Cardiovascular;  Laterality: N/A;  . PERIPHERAL VASCULAR CATHETERIZATION N/A 05/25/2015   Procedure: IVC Filter Insertion;  Surgeon: Katha Cabal, MD;  Location: [place] [facility];  Service: Cardiovascular;  Laterality: N/A;  . TOE AMPUTATION     right  . TOTAL HIP ARTHROPLASTY      Allergies: Sulfamethoxazole-trimethoprim  Medications: Prior to Admission medications   Medication Sig Start Date End Date Taking? Authorizing Provider  amLODipine (NORVASC) 5 MG tablet Take 5 mg by mouth daily.   Yes [provider]  aspirin EC 81 MG tablet Take 1 tablet (81 mg total) by mouth daily. 09/16/15  Yes [name], MD  cephALEXin (KEFLEX) 500 MG capsule Take 1 capsule (500 mg total) by mouth 4 (four) times daily for 10 days. 12/29/17 01/08/18 Yes Earleen Newport, MD  docusate sodium (COLACE) 100 MG capsule Take 100 mg by mouth 2 (two) times daily.    Yes [provider]  ENSURE (ENSURE) Take 1 Can by mouth 3 (three) times daily with meals.   Yes [provider]  folic acid (FOLVITE) 595 MCG tablet Take 400 mcg by mouth daily.   Yes [provider]  hydroxyurea (HYDREA) 500 MG capsule TAKE 4  CAPSULES BY MOUTH DAILY 01/02/18  Yes Corcoran, Melissa C, MD  ipratropium-albuterol (DUONEB) 0.5-2.5 (3) MG/3ML SOLN Take 3 mLs by nebulization every 4 (four) hours as needed.   Yes [provider]  LORazepam (ATIVAN) 0.5 MG tablet Take 0.5 mg by mouth every 4 (four) hours as needed for anxiety.   Yes [provider]  polyethylene glycol (MIRALAX / GLYCOLAX) packet Take 17 g by mouth daily.   Yes [provider]  potassium chloride (K-DUR,KLOR-CON) 10 MEQ tablet Take 10 mEq by mouth 2 (two) times a week. On Monday and Thursday  With lasix for potassium replacement   Yes [provider]  torsemide (DEMADEX) 10 MG tablet Take 10 mg by mouth daily.   Yes [provider]  triamcinolone cream (KENALOG) 0.1 % Apply 1 application topically 2 (two) times daily. To affected area   Yes [provider]  warfarin (COUMADIN) 3 MG tablet TAKE 1 TABLET BY MOUTH ALONG WITH A 1MG TABLET FOR A TOTAL DOSE OF 4MG 4 DAYS A WEEK. TAKE 3.5MG BY MOUTH 3 DAYS A WEEK 12/07/17  Yes Karen Kitchens, NP  warfarin (COUMADIN) 4 MG tablet  6 days a week 12/04/17  Yes Lequita Asal, MD     Family History  Problem Relation Age of Onset  . Brain cancer Mother   . Heart attack Father   . COPD Father   . Coronary artery disease Father   . Hypertension Unknown   . Arthritis-Osteo Unknown   . Diabetes Brother   . Arthritis Brother   . Osteosarcoma Son   . Bone cancer Son   . Arthritis Sister   . Arthritis Brother   . Hypertension Brother   . Coronary artery disease Brother     Social History   Socioeconomic History  . Marital status: Widowed    Spouse name: None  . Number of children: 5  . Years of education: None  . Highest education level: None  Social Needs  . Financial resource strain: None  . Food insecurity - worry: None  . Food insecurity - inability: None  . Transportation needs - medical: None  . Transportation needs - non-medical: None    Occupational History  . Occupation: retired  Tobacco Use  . Smoking status: Never Smoker  . Smokeless tobacco: Never Used  Substance and Sexual Activity  . Alcohol use: No  . Drug use: No  . Sexual activity: No  Other Topics Concern  . None  Social History Narrative   [facility] at [facility]    ECOG Status: 2 - Symptomatic, <50% confined to bed  Review of Systems: A 12 point ROS discussed and pertinent positives are indicated in the HPI above.  All other systems are negative.  Review of Systems  Vital Signs: BP (!) 177/93   Pulse 86   Temp 98.2 F (36.8 C) (Oral)   Resp (!) 24   SpO2 98%   Physical Exam Constitutional: Oriented to person, place, and time. Well-developed and well-nourished. No distress.  HENT:  Head: Normocephalic and atraumatic.  Eyes: Conjunctivae and EOM are normal. Right eye exhibits no discharge. Left eye exhibits no discharge. No scleral icterus.  Neck: No JVD present.  Pulmonary/Chest: Effort normal. No stridor. No respiratory distress.  Abdomen: soft, obese, non distended. Ostomy. Neurological:  alert and oriented to person, place, and time. Slurred speech. Skin: Skin is warm and dry.  not diaphoretic.  Psychiatric:   normal mood and affect.   behavior is normal. Judgment and thought content normal.   Imaging: No results found.  Labs:  CBC: Recent Labs    11/23/17 1027 12/23/17 1406 12/27/17 1408 12/29/17 0951  WBC 4.7 3.2* 2.6* 2.6*  HGB 12.4 12.7 13.3 12.7  HCT 39.4 38.8 42.2 39.1  PLT 761* 1,332* 889* 637*    COAGS: Recent Labs    10/30/17 0510 11/07/17 2101 12/04/17 1500 12/23/17 1406  INR 2.77 3.33 1.5 2.86    BMP: Recent Labs    11/23/17 1027 12/23/17 1406 12/27/17 1408 12/29/17 0951  NA 139 132* 138 138  K 4.3 3.7 4.0 4.4  CL 106 101 105 107  CO2 _0 GLUCOSE 92 108* 113* 95  BUN _1 CALCIUM 9.0 8.2* 9.0 8.5*  CREATININE 0.57 0.54 0.40* 0.58  GFRNONAA >60 >60 >60 >60  GFRAA >60 >60 >60  >60    LIVER FUNCTION TESTS: Recent Labs    11/23/17 1027 12/23/17 1406 12/27/17 1408 12/29/17 0951  BILITOT 1.0 0.8 0.8 0.8  AST _2 ALT 6* 9* 9* 9*  ALKPHOS 68 76 69 64  PROT 7.4  7.6 7.6 7.2  ALBUMIN 2.7* 3.0* 3.1* 2.8*    TUMOR MARKERS: No results for input(s): AFPTM, CEA, CA199, CHROMGRNA in the last 8760 hours.  Assessment and Plan:  Polycythemia and other blood count anomalies. Proceed with CT guided bone marrow biopsy. This procedure has been fully reviewed with the patient and family member, and written informed consent has been obtained.   Thank you for this interesting consult.  I greatly enjoyed meeting Yolanda Wells and look forward to participating in their care.  A copy of this report was sent to the requesting provider on this date.  Electronically Signed: Rickard Rhymes, MD 01/07/2018, 9:30 AM   I spent a total of  15 Minutes   in face to face in clinical consultation, greater than 50% of which was counseling/coordinating care for bone marrow biopsy.

## 2018-01-07 NOTE — Progress Notes (Signed)
BP dropped with sedation-see flowsheets.  200 fluid bolus given. BP returned to WNL.  IV rate reduced to 1450 mlo/hr.  BP now 86/57 in SR.  Dr. Vernard Gambles notified-300 ml fluid bolus started.

## 2018-01-07 NOTE — Procedures (Signed)
  Procedure: CT bone marrow biopsy   EBL:   minimal Complications:  none immediate  See full dictation in [facility].  Dillard Cannon MD Main # [phone] Pager  [phone]

## 2018-01-08 ENCOUNTER — Other Ambulatory Visit: Payer: Self-pay

## 2018-01-08 ENCOUNTER — Emergency Department
Admission: EM | Admit: 2018-01-08 | Discharge: 2018-01-08 | Disposition: A | Discharge status: Home / Self Care | Payer: Medicare Other | Attending: Emergency Medicine | Admitting: Emergency Medicine

## 2018-01-08 ENCOUNTER — Encounter: Payer: Self-pay | Admitting: Emergency Medicine

## 2018-01-08 ENCOUNTER — Emergency Department: Payer: Medicare Other

## 2018-01-08 DIAGNOSIS — Z7901 Long term (current) use of anticoagulants: Secondary | ICD-10-CM | POA: Insufficient documentation

## 2018-01-08 DIAGNOSIS — K529 Noninfective gastroenteritis and colitis, unspecified: Secondary | ICD-10-CM | POA: Diagnosis not present

## 2018-01-08 DIAGNOSIS — I5032 Chronic diastolic (congestive) heart failure: Secondary | ICD-10-CM | POA: Diagnosis not present

## 2018-01-08 DIAGNOSIS — Z79899 Other long term (current) drug therapy: Secondary | ICD-10-CM | POA: Diagnosis not present

## 2018-01-08 DIAGNOSIS — Z96649 Presence of unspecified artificial hip joint: Secondary | ICD-10-CM | POA: Insufficient documentation

## 2018-01-08 DIAGNOSIS — J449 Chronic obstructive pulmonary disease, unspecified: Secondary | ICD-10-CM | POA: Insufficient documentation

## 2018-01-08 DIAGNOSIS — I110 Hypertensive heart disease with heart failure: Secondary | ICD-10-CM | POA: Insufficient documentation

## 2018-01-08 DIAGNOSIS — Z7982 Long term (current) use of aspirin: Secondary | ICD-10-CM | POA: Diagnosis not present

## 2018-01-08 DIAGNOSIS — R1084 Generalized abdominal pain: Secondary | ICD-10-CM | POA: Diagnosis present

## 2018-01-08 LAB — COMPREHENSIVE METABOLIC PANEL
ALK PHOS: 68 U/L (ref 38–126)
ALT: 9 U/L — AB (ref 14–54)
AST: 19 U/L (ref 15–41)
Albumin: 3.1 g/dL — ABNORMAL LOW (ref 3.5–5.0)
Anion gap: 7 (ref 5–15)
BUN: 10 mg/dL (ref 6–20)
CALCIUM: 8.5 mg/dL — AB (ref 8.9–10.3)
CO2: 26 mmol/L (ref 22–32)
CREATININE: 0.62 mg/dL (ref 0.44–1.00)
Chloride: 106 mmol/L (ref 101–111)
Glucose, Bld: 94 mg/dL (ref 65–99)
Potassium: 3.9 mmol/L (ref 3.5–5.1)
Sodium: 139 mmol/L (ref 135–145)
Total Bilirubin: 0.8 mg/dL (ref 0.3–1.2)
Total Protein: 7.7 g/dL (ref 6.5–8.1)

## 2018-01-08 LAB — CBC
HCT: 40.3 % (ref 35.0–47.0)
Hemoglobin: 13 g/dL (ref 12.0–16.0)
MCH: 26.5 pg (ref 26.0–34.0)
MCHC: 32.2 g/dL (ref 32.0–36.0)
MCV: 82.3 fL (ref 80.0–100.0)
PLATELETS: 1028 10*3/uL — AB (ref 150–440)
RBC: 4.9 MIL/uL (ref 3.80–5.20)
RDW: 37.9 % — AB (ref 11.5–14.5)
WBC: 4.7 10*3/uL (ref 3.6–11.0)

## 2018-01-08 LAB — URINALYSIS, ROUTINE W REFLEX MICROSCOPIC
Bilirubin Urine: NEGATIVE
Glucose, UA: NEGATIVE mg/dL
HGB URINE DIPSTICK: NEGATIVE
Ketones, ur: NEGATIVE mg/dL
NITRITE: NEGATIVE
PH: 5 (ref 5.0–8.0)
Protein, ur: NEGATIVE mg/dL
Specific Gravity, Urine: 1.016 (ref 1.005–1.030)

## 2018-01-08 LAB — URINALYSIS, COMPLETE (UACMP) WITH MICROSCOPIC
Bacteria, UA: NONE SEEN
Bilirubin Urine: NEGATIVE
GLUCOSE, UA: NEGATIVE mg/dL
Hgb urine dipstick: NEGATIVE
KETONES UR: NEGATIVE mg/dL
Nitrite: NEGATIVE
PH: 5 (ref 5.0–8.0)
Protein, ur: NEGATIVE mg/dL
SPECIFIC GRAVITY, URINE: 1.015 (ref 1.005–1.030)

## 2018-01-08 LAB — GASTROINTESTINAL PANEL BY PCR, STOOL (REPLACES STOOL CULTURE)

## 2018-01-08 LAB — LIPASE, BLOOD: Lipase: 18 U/L (ref 11–51)

## 2018-01-08 LAB — C DIFFICILE QUICK SCREEN W PCR REFLEX
C DIFFICILE (CDIFF) INTERP: NOT DETECTED
C Diff antigen: NEGATIVE
C Diff toxin: NEGATIVE

## 2018-01-08 MED ORDER — IOPAMIDOL (ISOVUE-300) INJECTION 61%
100.0000 mL | Freq: Once | INTRAVENOUS | Status: AC | PRN
  Administered 2018-01-08: 100 mL via INTRAVENOUS

## 2018-01-08 MED ORDER — SODIUM CHLORIDE 0.9 % IV BOLUS (SEPSIS)
500.0000 mL | INTRAVENOUS | Status: AC
  Administered 2018-01-08: 500 mL via INTRAVENOUS

## 2018-01-08 NOTE — ED Notes (Signed)
Nurse updated pt's daughter about pt's status.

## 2018-01-08 NOTE — Discharge Instructions (Signed)
Yolanda Wells workup today was reassuring.  Her lab work is unchanged from prior (she still has more platelets than most people, but this is been going on since at least 2016), and she has no signs of acute infection.  We checked her stool for signs of bacterial and viral infection, and all of the tests for infection were negative.  He obtained a CT scan of her abdomen and pelvis which demonstrated enteritis , which is inflammation or infection of her intestines, but again there was no sign of infection in the stool studies.  We recommend she continue drinking plenty of fluids to stay hydrated follow-up with her regular doctor at the next available opportunity.  There are no antibiotics that will help, but she could consider taking a dose of Imodium to help with the frequency of stooling.  The symptoms may pass on their own soon, however, and any additional medications she takes run the risk of interacting with her Coumadin (warfarin) and other medications she takes regularly.    Return to the emergency department if you develop new or worsening symptoms that concern you.

## 2018-01-08 NOTE — ED Triage Notes (Signed)
Pt to ED via EMS from home with c/o abd pain with nausea and vomiting. Pt had bone marrow biopsy yesterday per EMS, has colostomy bag. Pt A&Ox4, VS stable.

## 2018-01-08 NOTE — ED Notes (Signed)
Granddaughter here at bedside talking with Dr Karma Greaser

## 2018-01-08 NOTE — ED Notes (Signed)
Nurse provided pt with warm blanket and turned the heat up.

## 2018-01-08 NOTE — ED Notes (Signed)
Dr. Karma Greaser notified of pt's platelet count being 1028.

## 2018-01-08 NOTE — ED Notes (Signed)
Nurse spoke with pt's granddaughter that provides her care. [name] was updated and is aware of pt's status at this time.

## 2018-01-08 NOTE — ED Provider Notes (Signed)
[facility] Emergency Department Provider Note  ____________________________________________   First MD Initiated Contact with Patient 01/08/18 1529     (approximate)  I have reviewed the triage vital signs and the nursing notes.   HISTORY  Chief Complaint Abdominal Pain    HPI Yolanda Tobey is a 84 y.o. female with extensive medical history as listed below who presents for evaluation of frequent stooling in her colostomy bag as well as possible intermittent abdominal pain and nausea and vomiting.  Yesterday she had a bone marrow biopsy for further evaluation of possible polycythemia vera (according to medical records).  She has long-standing thrombocytosis.  She had acute onset today of intermittent abdominal cramping and some nausea as well as at least one episode of vomiting.  She has had to change her colostomy bag multiple times today, much more often than usual.  She is alert, interactive, happy, and laughing and joking with [place] at the time of triage she arrived by EMS.  She is in no distress.  She denies fever/chills, chest pain, shortness of breath, and she is incontinent of urine at baseline but has no dysuria.  Past Medical History:  Diagnosis Date  . Acute deep vein thrombosis (DVT) of distal vein of left lower extremity ([place]) 10/03/2015  . Acute pulmonary embolism ([place]) 10/03/2015  . Anticoagulant long-term use   . Arthritis   . CHF (congestive heart failure) ([place])   . Chronic anticoagulation 10/03/2015  . Collagen vascular disease ([place])   . Colostomy in place [facility]) 10/04/2015  . Deep venous thrombosis (HCC)    right lower extremity  . Dependent edema   . Diverticulosis of intestine with bleeding 04/02/2015  . Glenohumeral arthritis 06/28/2012  . Hammertoe 09/02/2012  . History of bilateral hip replacements 09/02/2012  . History of hysterectomy   . Hypertension   . Hypokalemia   . Inability to ambulate due to hip 10/12/2015  .  Mandibular dysfunction   . Onychomycosis 09/02/2012  . Osteoarthritis   . Polycythemia vera ([place])   . Presence of IVC filter 05/25/2015  . 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 Urinary incontinence in female 09/02/2016    Patient Active Problem List   Diagnosis Date Noted  . Goals of care, counseling/discussion 12/23/2017  . Palliative care by specialist   . DNR (do not resuscitate) discussion   . Weakness generalized   . Hypoxia 10/13/2017  . Acute CHF (congestive heart failure) ([place]) 10/06/2017  . Chronic diastolic CHF (congestive heart failure) ([place]) 07/28/2017  . Dyspnea 05/27/2017  . Ileus ([place]) 04/10/2017  . Influenza with respiratory manifestation 12/01/2016  . Respiratory distress 11/23/2016  . Urinary incontinence in female 09/02/2016  . Vision loss of right eye 08/26/2016  . Blindness of right eye 08/26/2016  . Inability to ambulate due to hip 10/12/2015  . Colostomy in place [facility]) 10/04/2015  . Chronic anticoagulation 10/03/2015  . Acute pulmonary embolism ([place]) 10/03/2015  . Leukocytosis 09/25/2015  . Chest pain 09/25/2015  . COPD exacerbation ([place]) 09/24/2015  . CAP (community acquired pneumonia) 09/24/2015  . COPD (chronic obstructive pulmonary disease) ([place]) 09/24/2015  . SOB (shortness of breath) 09/22/2015  . Elevated troponin 09/15/2015  . UTI (urinary tract infection) 07/16/2015  . Abdominal wall cellulitis 07/15/2015  . DVT (deep venous thrombosis) ([place]) 07/15/2015  . HTN (hypertension) 07/15/2015  . Polycythemia vera ([place]) 07/15/2015  . Dependent edema 07/15/2015  . Arthritis 07/15/2015  . Pulmonary emboli ([place]) 05/25/2015  . Diverticulosis of colon with  hemorrhage 04/02/2015  . Diverticulosis of intestine with bleeding 04/02/2015  . Dehydration, moderate 01/11/2015  . Fall 01/10/2015  . Osteoarthritis 01/10/2015  . Hammertoe 09/02/2012  . S/P transmetatarsal amputation of foot ([place]) 09/02/2012  . Onychomycosis 09/02/2012  . Other specified  dermatoses 09/02/2012  . Hip pain 08/23/2012  . S/P hip replacement 08/23/2012  . Glenohumeral arthritis 06/28/2012    Past Surgical History:  Procedure Laterality Date  . ABDOMINAL HYSTERECTOMY    . BLEPHAROPLASTY Right 03/2017   right upper eyelid  . CARDIAC CATHETERIZATION Right 04/03/2015   Procedure: CENTRAL LINE INSERTION;  Surgeon: Sherri Rad, MD;  Location: [facility] ORS;  Service: General;  Laterality: Right;  . COLECTOMY WITH COLOSTOMY CREATION/HARTMANN PROCEDURE N/A 04/03/2015   Procedure: COLECTOMY WITH COLOSTOMY CREATION/HARTMANN PROCEDURE;  Surgeon: Sherri Rad, MD;  Location: [facility] ORS;  Service: General;  Laterality: N/A;  . COLON SURGERY    . FOOT AMPUTATION     partial  . FOOT AMPUTATION Right   . JOINT REPLACEMENT     Left and Right Hip  . PERIPHERAL VASCULAR CATHETERIZATION N/A 04/02/2015   Procedure: Visceral Angiography;  Surgeon: Algernon Huxley, MD;  Location: [place] CV [facility];  Service: Cardiovascular;  Laterality: N/A;  . PERIPHERAL VASCULAR CATHETERIZATION N/A 04/02/2015   Procedure: Visceral Artery Intervention;  Surgeon: Algernon Huxley, MD;  Location: [place] CV LAB;  Service: Cardiovascular;  Laterality: N/A;  . PERIPHERAL VASCULAR CATHETERIZATION N/A 05/25/2015   Procedure: IVC Filter Insertion;  Surgeon: Katha Cabal, MD;  Location: [place] CV [facility];  Service: Cardiovascular;  Laterality: N/A;  . TOE AMPUTATION     right  . TOTAL HIP ARTHROPLASTY      Prior to Admission medications   Medication Sig Start Date End Date Taking? Authorizing Provider  amLODipine (NORVASC) 5 MG tablet Take 5 mg by mouth daily.   Yes [provider]  aspirin EC 81 MG tablet Take 1 tablet (81 mg total) by mouth daily. 09/16/15  Yes [name], MD  cephALEXin (KEFLEX) 500 MG capsule Take 1 capsule (500 mg total) by mouth 4 (four) times daily for 10 days. 12/29/17 01/08/18 Yes Earleen Newport, MD  docusate sodium (COLACE) 100 MG capsule Take 100 mg by mouth  2 (two) times daily.    Yes [provider]  ENSURE (ENSURE) Take 1 Can by mouth 3 (three) times daily with meals.   Yes [provider]  folic acid (FOLVITE) 476 MCG tablet Take 400 mcg by mouth daily.   Yes [provider]  hydroxyurea (HYDREA) 500 MG capsule TAKE 4 CAPSULES BY MOUTH DAILY 01/02/18  Yes Corcoran, Melissa C, MD  LORazepam (ATIVAN) 0.5 MG tablet Take 0.5 mg by mouth daily.    Yes [provider]  warfarin (COUMADIN) 3 MG tablet TAKE ONE TABLET ALONG WITH 1MG TABLET FOR TOTAL 3F 4MG 4 DAYS PER WEEK, TAKE 3.5MG FOR 3 DAYS A WEEK. Patient taking differently: Take 3 mg by mouth. 6 DAYS A WEEK AT BEDTIME 01/07/18  Yes Karen Kitchens, NP  warfarin (COUMADIN) 4 MG tablet 6 days a week Patient taking differently: Take 4 mg by mouth once a week. Wednesday NIGHT 12/04/17  Yes Corcoran, Lenna Sciara C, MD  ipratropium-albuterol (DUONEB) 0.5-2.5 (3) MG/3ML SOLN Take 3 mLs by nebulization every 4 (four) hours as needed.    [provider]  polyethylene glycol (MIRALAX / GLYCOLAX) packet Take 17 g by mouth daily.    [provider]  potassium chloride (  K-DUR,KLOR-CON) 10 MEQ tablet Take 10 mEq by mouth as needed. TAKE WITH LASIX PRN    [provider]  torsemide (DEMADEX) 10 MG tablet Take 10 mg by mouth daily as needed.     [provider]  triamcinolone cream (KENALOG) 0.1 % Apply 1 application topically 2 (two) times daily. To affected area    [provider]    Allergies Sulfamethoxazole-trimethoprim  Family History  Problem Relation Age of Onset  . Brain cancer Mother   . Heart attack Father   . COPD Father   . Coronary artery disease Father   . Hypertension Unknown   . Arthritis-Osteo Unknown   . Diabetes Brother   . Arthritis Brother   . Osteosarcoma Son   . Bone cancer Son   . Arthritis Sister   . Arthritis Brother   . Hypertension Brother   . Coronary artery disease Brother     Social  History Social History   Tobacco Use  . Smoking status: Never Smoker  . Smokeless tobacco: Never Used  Substance Use Topics  . Alcohol use: No  . Drug use: No    Review of Systems Constitutional: No fever/chills Eyes: No visual changes. ENT: No sore throat. Cardiovascular: Denies chest pain. Respiratory: Denies shortness of breath. Gastrointestinal: Abdominal pain, N/V/D (in colostomy bag) as described above Genitourinary: Negative for dysuria. Musculoskeletal: Negative for neck pain.  Negative for back pain. Integumentary: Negative for rash. Neurological: Negative for headaches, focal weakness or numbness.   ____________________________________________   PHYSICAL EXAM:  VITAL SIGNS: ED Triage Vitals [01/08/18 1503]  Enc Vitals Group     BP (!) 160/99     Pulse Rate 89     Resp 16     Temp 98 F (36.7 C)     Temp Source Oral     SpO2 96 %     Weight      Height      Head Circumference      Peak Flow      Pain Score 10     Pain Loc      Pain Edu?      Excl. in [place]?     Constitutional: Alert and oriented. Well appearing and in no acute distress. Eyes: Conjunctivae are normal.  Head: Atraumatic. Nose: No congestion/rhinnorhea. Mouth/Throat: Mucous membranes are moist. Neck: No stridor.  No meningeal signs.   Cardiovascular: Normal rate, regular rhythm. Good peripheral circulation. Grossly normal heart sounds. Respiratory: Normal respiratory effort.  No retractions. Lungs CTAB. Gastrointestinal: Soft and nontender.  Colostomy bag is in place and full of very loose stool that is dark brown.  At first the patient reported tenderness to palpation of her abdomen, but then she said I surprised her.  With subsequent palpation she had no tenderness. Musculoskeletal: No lower extremity tenderness nor edema. No gross deformities of extremities. Neurologic:  Normal speech and language. No gross focal neurologic deficits are appreciated.  Skin:  Skin is warm, dry and  intact. No rash noted. Psychiatric: Mood and affect are normal. Speech and behavior are normal.  ____________________________________________   LABS (all labs ordered are listed, but only abnormal results are displayed)  Labs Reviewed  COMPREHENSIVE METABOLIC PANEL - Abnormal; Notable for the following components:      Result Value   Calcium 8.5 (*)    Albumin 3.1 (*)    ALT 9 (*)    All other components within normal limits  CBC - Abnormal; Notable for the following components:  RDW 37.9 (*)    Platelets 1,028 (*)    All other components within normal limits  URINALYSIS, COMPLETE (UACMP) WITH MICROSCOPIC - Abnormal; Notable for the following components:   Color, Urine YELLOW (*)    APPearance CLEAR (*)    Leukocytes, UA TRACE (*)    Squamous Epithelial / LPF 0-5 (*)    All other components within normal limits  C DIFFICILE QUICK SCREEN W PCR REFLEX  GASTROINTESTINAL PANEL BY PCR, STOOL (REPLACES STOOL CULTURE)  URINE CULTURE  LIPASE, BLOOD  URINALYSIS, ROUTINE W REFLEX MICROSCOPIC   ____________________________________________  EKG  None - EKG not ordered by ED physician ____________________________________________  RADIOLOGY Ursula Alert, personally viewed and evaluated these images (plain radiographs) as part of my medical decision making, as well as reviewing the written report by the radiologist.  ED MD interpretation: CT scan is consistent with enteritis but without any acute or emergent condition identified  Official radiology report(s): Ct Abdomen Pelvis W Contrast  Result Date: 01/08/2018 CLINICAL DATA:  Abdominal pain with nausea and vomiting. Status post bone marrow biopsy yesterday. History of chronic colostomy and Hartmann's pouch. EXAM: CT ABDOMEN AND PELVIS WITH CONTRAST TECHNIQUE: Multidetector CT imaging of the abdomen and pelvis was performed using the standard protocol following bolus administration of intravenous contrast. CONTRAST:  130m  ISOVUE-300 IOPAMIDOL (ISOVUE-300) INJECTION 61% COMPARISON:  CT abdomen and pelvis Apr 09, 2017. FINDINGS: LOWER CHEST: Mildly elevated RIGHT hemidiaphragm with RIGHT lung base pleural thickening. RIGHT greater than LEFT lower lobe atelectasis. The heart is mildly enlarged. Heavy mitral annular calcifications. No pericardial effusion. Main pulmonary artery is enlarged at 4.4 cm, associated with chronic pulmonary arterial hypertension. HEPATOBILIARY: At least 2 radiopaque gallstones measuring to 18 mm without CT findings of acute cholecystitis. Mild focal fatty infiltration about the falciform ligament. Mild heterogeneous hepatic attenuation is similar, no focal mass. PANCREAS: Normal. SPLEEN: Stable subcentimeter probable cyst or lymphangioma spleen. Spleen is 12.9 cm in cranial caudad dimension, increased from prior examination at 12.3 cm. ADRENALS/URINARY TRACT: Kidneys are orthotopic, demonstrating symmetric enhancement. No nephrolithiasis, hydronephrosis or solid renal masses. Bilateral renal cysts measuring to 7.3 cm RIGHT interpolar kidney extending into renal pelvis. The unopacified ureters are normal in course and caliber. Delayed imaging through the kidneys demonstrates symmetric prompt contrast excretion within the proximal urinary collecting system. Urinary bladder is partially distended and unremarkable. Normal adrenal glands. STOMACH/BOWEL: Very small hiatal hernia. Severe descending and sigmoid colonic diverticulosis. Status post partial colectomy. Transverse colon extends into LEFT upper quadrant ostomy. Small and large bowel air-fluid levels. Normal appendix. VASCULAR/LYMPHATIC: Aortoiliac vessels are normal in course and caliber. Mild calcific atherosclerosis. Inferior vena cava filter extending to the suprarenal IVC, unchanged in position. No lymphadenopathy by CT size criteria. REPRODUCTIVE: Status post hysterectomy. OTHER: No intraperitoneal free fluid or free air. MUSCULOSKELETAL: Nonacute.  Anterior abdominal wall scarring. Streak artifact from bilateral total hip arthroplasties. Stable focal osteopenia in RIGHT sacral ala. Severe L2-3 and L3-4 degenerative discs. Grade 1 L4-5 anterolisthesis. Multilevel severe facet arthropathy. Severe neural foraminal narrowing L2-3 through L5-S1. IMPRESSION: 1. Small and large bowel air-fluid levels seen with enteritis. No bowel obstruction. 2. Status post partial colectomy with normal appearance of LEFT lower quadrant colostomy. 3. Severe descending and sigmoid colonic diverticulosis. 4. Increased, borderline splenomegaly. Aortic Atherosclerosis (ICD10-I70.0). Electronically Signed   By: CElon AlasM.D.   On: 01/08/2018 18:59    ____________________________________________   PROCEDURES  Critical Care performed: No   Procedure(s) performed:   Procedures  ____________________________________________   INITIAL IMPRESSION / ASSESSMENT AND PLAN / ED COURSE  As part of my medical decision making, I reviewed the following data within the [facility] notes reviewed and incorporated, Labs reviewed , Old chart reviewed and Notes from prior ED visits    Differential diagnosis includes, but is not limited to, viral enteritis, bacterial infection including but not limited to C. difficile, SBO/ileus, inflammatory colitis or enteritis, ischemic colitis, etc.  She has a benign abdominal exam.  I will check GI pathogen panel and C. difficile as well as standard laboratory workup.  No evidence of acute or emergent condition at this time.  The patient has no complaints at this time..  I will evaluate her broadly and consider imaging based on her comorbidities unless she has an obvious diagnosis such as C. difficile.     Clinical Course as of Jan 09 1944  Fri Jan 08, 2018  1818 The patient's GI pathogen panel is all negative, and her C. difficile test is also negative.  Given her age and comorbidities I am checking a CT  scan of her abdomen but thus far I have not identified an acute or emergent medical condition.  She has received a 500 mL normal saline bolus.  [CF]  1933 CT scan is unremarkable except for probable enteritis.  The patient has been watching TV in no acute distress.  I updated her about all the results and she is comfortable with the plan to go home.    I gave my usual and customary return precautions.   [CF]  1935 Her granddaughter came to check on her and pick her up.  I discussed with her granddaughter the results and her granddaughter confirm that she is taking or finishing up a course of Keflex.  I explained that this is likely the cause of the enteritis and that it should improve once she has completed the course of medication.  Granddaughter agrees.  [CF]    Clinical Course User Index [CF] Hinda Kehr, MD    ____________________________________________  FINAL CLINICAL IMPRESSION(S) / ED DIAGNOSES  Final diagnoses:  Enteritis     MEDICATIONS GIVEN DURING THIS VISIT:  Medications  sodium chloride 0.9 % bolus 500 mL (500 mLs Intravenous New Bag/Given 01/08/18 1812)  iopamidol (ISOVUE-300) 61 % injection 100 mL (100 mLs Intravenous Contrast Given 01/08/18 1832)     ED Discharge Orders    None       Note:  This document was prepared using Dragon voice recognition software and may include unintentional dictation errors.    Hinda Kehr, MD 01/08/18 1945

## 2018-01-12 LAB — URINE CULTURE: SPECIAL REQUESTS: NORMAL

## 2018-01-13 NOTE — Progress Notes (Signed)
ED Antimicrobial Stewardship Positive Culture Follow Up   Yolanda Wells is an 84 y.o. female who presented to [facility] on 01/08/2018 with a chief complaint of  Chief Complaint  Patient presents with  . Abdominal Pain    Recent Results (from the past 720 hour(s))  Urine culture     Status: Abnormal   Collection Time: 12/29/17  9:51 AM  Result Value Ref Range Status   Specimen Description   Final    URINE, CATHETERIZED Performed at [facility], [address] [place], [place] [address]    Special Requests   Final    Normal Performed at [facility], [address] [place], [place] [address]    Culture >=100,000 COLONIES/mL ESCHERICHIA COLI (A)  Final   Report Status 12/31/2017 FINAL  Final   Organism ID, Bacteria ESCHERICHIA COLI (A)  Final      Susceptibility   Escherichia coli - MIC*    AMPICILLIN >=32 RESISTANT Resistant     CEFAZOLIN <=4 SENSITIVE Sensitive     CEFTRIAXONE <=1 SENSITIVE Sensitive     CIPROFLOXACIN >=4 RESISTANT Resistant     GENTAMICIN <=1 SENSITIVE Sensitive     IMIPENEM <=0.25 SENSITIVE Sensitive     NITROFURANTOIN <=16 SENSITIVE Sensitive     TRIMETH/SULFA <=20 SENSITIVE Sensitive     AMPICILLIN/SULBACTAM 4 SENSITIVE Sensitive     PIP/TAZO <=4 SENSITIVE Sensitive     Extended ESBL NEGATIVE Sensitive     * >=100,000 COLONIES/mL ESCHERICHIA COLI  C difficile quick scan w PCR reflex     Status: None   Collection Time: 01/08/18  3:41 PM  Result Value Ref Range Status   C Diff antigen NEGATIVE NEGATIVE Final   C Diff toxin NEGATIVE NEGATIVE Final   C Diff interpretation No C. difficile detected.  Final    Comment: Performed at [facility], [address] [place], [place] [address]  Gastrointestinal Panel by PCR , Stool     Status: None   Collection Time: 01/08/18  3:41 PM  Result Value Ref Range Status   Campylobacter species NOT DETECTED NOT DETECTED Final   Plesimonas shigelloides NOT DETECTED NOT DETECTED Final    Salmonella species NOT DETECTED NOT DETECTED Final   Yersinia enterocolitica NOT DETECTED NOT DETECTED Final   Vibrio species NOT DETECTED NOT DETECTED Final   Vibrio cholerae NOT DETECTED NOT DETECTED Final   Enteroaggregative E coli (EAEC) NOT DETECTED NOT DETECTED Final   Enteropathogenic E coli (EPEC) NOT DETECTED NOT DETECTED Final   Enterotoxigenic E coli (ETEC) NOT DETECTED NOT DETECTED Final   Shiga like toxin producing E coli (STEC) NOT DETECTED NOT DETECTED Final   Shigella/Enteroinvasive E coli (EIEC) NOT DETECTED NOT DETECTED Final   Cryptosporidium NOT DETECTED NOT DETECTED Final   Cyclospora cayetanensis NOT DETECTED NOT DETECTED Final   Entamoeba histolytica NOT DETECTED NOT DETECTED Final   Giardia lamblia NOT DETECTED NOT DETECTED Final   Adenovirus F40/41 NOT DETECTED NOT DETECTED Final   Astrovirus NOT DETECTED NOT DETECTED Final   Norovirus GI/GII NOT DETECTED NOT DETECTED Final   Rotavirus A NOT DETECTED NOT DETECTED Final   Sapovirus (I, II, IV, and V) NOT DETECTED NOT DETECTED Final    Comment: Performed at [facility], [address], [place], [place] [address]  Urine Culture     Status: Abnormal   Collection Time: 01/08/18  3:41 PM  Result Value Ref Range Status   Specimen Description   Final  URINE, RANDOM Performed at [facility], [address] [place], [place] [address]    Special Requests   Final    Normal Performed at [facility], [place], [place] [address]    Culture 60,000 COLONIES/mL PSEUDOMONAS AERUGINOSA (A)  Final   Report Status 01/12/2018 FINAL  Final   Organism ID, Bacteria PSEUDOMONAS AERUGINOSA (A)  Final      Susceptibility   Pseudomonas aeruginosa - MIC*    CEFTAZIDIME 32 RESISTANT Resistant     CIPROFLOXACIN 2 INTERMEDIATE Intermediate     GENTAMICIN 4 SENSITIVE Sensitive     IMIPENEM <=0.25 SENSITIVE Sensitive     CEFEPIME 32 RESISTANT Resistant     * 60,000 COLONIES/mL  PSEUDOMONAS AERUGINOSA   Urine Cx showing 60,000 Pseudomonas. Patient did not have any UTI symptoms and was Dx with Enteritis. After discussion with Dr. Joni Fears, patient will not be prescribed any Abx at this time.   ED Provider: Carrie Mew   Pernell Dupre ,[place], BCPS Clinical Pharmacist  01/13/2018, 1:26 PM

## 2018-01-15 ENCOUNTER — Telehealth: Payer: Self-pay | Admitting: *Deleted

## 2018-01-15 NOTE — Telephone Encounter (Signed)
[facility] RN called asking when they need to recheck INR on Yolanda Wells. Please advise.  Fax order to [employer] [phone]

## 2018-01-15 NOTE — Telephone Encounter (Signed)
Please check now. Her last one was on 01/07/2018. She was sub-therapeutic at 1.59 at that time.

## 2018-01-15 NOTE — Telephone Encounter (Signed)
Returned call to nurse and she states she will check it tomorrow as she has already left her house today adnd she will call answering services with results

## 2018-01-18 ENCOUNTER — Encounter (HOSPITAL_COMMUNITY): Payer: Self-pay | Admitting: Hematology and Oncology

## 2018-01-18 LAB — CHROMOSOME ANALYSIS, BONE MARROW

## 2018-01-25 ENCOUNTER — Telehealth: Payer: Self-pay | Admitting: *Deleted

## 2018-01-25 NOTE — Telephone Encounter (Signed)
Bone marrow results are back. Please schedule patient for RTC for MD assessment, labs (CBC with diff, CMP, PT/INR), and  +/- phlebotomy.

## 2018-01-25 NOTE — Telephone Encounter (Signed)
[name] called asking for results of BMB and what the plan is. Please return her call [phone]  FINAL for BLESSINGS, [place] ([id number]) Patient: GRACIELA, PLATO Collected: 01/07/2018 Client: [facility] Accession: [id number] Received: 01/07/2018 D. Arne Cleveland, MD DOB: 1932/03/06 Age: 84 Gender: F Reported: 01/11/2018 [address] Patient Ph: [phone] MRN #: [id number] [place] , [place] [address] Visit #: [id number] Chart #: Phone: Fax: [phone] CC: Nolon Stalls, MD FLOW CYTOMETRY REPORT INTERPRETATION Interpretation Bone Marrow Flow Cytometry - NO SIGNIFICANT BLASTIC POPULATION IDENTIFIED. Susanne Greenhouse MD Pathologist, Electronic Signature (Case signed 01/11/2018) GROSS AND MICROSCOPIC INFORMATION Source Bone Marrow Flow Cytometry Microscopic Gated population: Flow cytometric immunophenotyping is performed using antibiodies to the antigens listed in the table below. Electronic gates are placed around a cell cluster displaying light scatter properties corresponding to blasts. - Abnormal Cells in sample: N/A - Phenotype of Abnormal Cells: N/A 1 of 2 FINAL for AJLA, MCGEACHY ([id number]) Specimen Table Lymphoid Associated Myeloid Associated Misc. Associated CD2 tested CD19 tested CD11c tested CD45 tested CD3 tested CD20 tested CD13 tested HLA-DR tested CD4 ND CD21 ND CD14 tested CD10 tested CD5 tested CD22 tested CD15 tested CD56/16 ND CD7 tested CD23 ND CD33 tested ZAP70 ND CD8 ND CD103 ND MPO tested CD34 tested CD25 ND FMC7 ND CD117 tested CD52 ND sKappa ND CD38 ND sLambda ND cKappa ND cLambda ND Gross Reference Bone Marrow case [id number]. All controls stained appropriately. The above tests were developed and their performance characteristics determined by the [facility]. They have not been cleared or approved by the U.S. Food and Drug Administration." Report signed from the following location(s) Interpretation was performed  at [facility] [place], [place], [place] [address]. CLIA #: [id number], 2 of 2

## 2018-01-26 ENCOUNTER — Telehealth: Payer: Self-pay | Admitting: *Deleted

## 2018-01-26 NOTE — Telephone Encounter (Signed)
Per Gaspar Bidding: Bone marrow results are back Please schedule patient for RTC for MD assessment, labs (CBC with diff, CMP, PT/INR), and  +/- phlebotomy Spoke with patient's granddaughter [name] and made her aware of the scheduled date and time of the appt.

## 2018-01-29 ENCOUNTER — Inpatient Hospital Stay: Payer: Medicare Other

## 2018-01-29 ENCOUNTER — Other Ambulatory Visit: Payer: Self-pay | Admitting: Hematology and Oncology

## 2018-01-29 ENCOUNTER — Telehealth: Payer: Self-pay | Admitting: Hematology and Oncology

## 2018-01-29 ENCOUNTER — Inpatient Hospital Stay: Payer: Medicare Other | Attending: Hematology and Oncology

## 2018-01-29 ENCOUNTER — Inpatient Hospital Stay (HOSPITAL_BASED_OUTPATIENT_CLINIC_OR_DEPARTMENT_OTHER): Payer: Medicare Other | Admitting: Hematology and Oncology

## 2018-01-29 ENCOUNTER — Encounter: Payer: Self-pay | Admitting: Hematology and Oncology

## 2018-01-29 ENCOUNTER — Telehealth: Payer: Self-pay | Admitting: Pharmacist

## 2018-01-29 VITALS — BP 141/85 | HR 80 | Temp 98.6°F | Resp 16

## 2018-01-29 DIAGNOSIS — Z7901 Long term (current) use of anticoagulants: Secondary | ICD-10-CM

## 2018-01-29 DIAGNOSIS — Z8673 Personal history of transient ischemic attack (TIA), and cerebral infarction without residual deficits: Secondary | ICD-10-CM

## 2018-01-29 DIAGNOSIS — Z86718 Personal history of other venous thrombosis and embolism: Secondary | ICD-10-CM | POA: Diagnosis not present

## 2018-01-29 DIAGNOSIS — D45 Polycythemia vera: Secondary | ICD-10-CM | POA: Insufficient documentation

## 2018-01-29 DIAGNOSIS — Z86711 Personal history of pulmonary embolism: Secondary | ICD-10-CM

## 2018-01-29 DIAGNOSIS — Z7189 Other specified counseling: Secondary | ICD-10-CM

## 2018-01-29 DIAGNOSIS — D471 Chronic myeloproliferative disease: Secondary | ICD-10-CM | POA: Diagnosis not present

## 2018-01-29 DIAGNOSIS — I2782 Chronic pulmonary embolism: Secondary | ICD-10-CM

## 2018-01-29 DIAGNOSIS — I82509 Chronic embolism and thrombosis of unspecified deep veins of unspecified lower extremity: Secondary | ICD-10-CM

## 2018-01-29 LAB — COMPREHENSIVE METABOLIC PANEL
ALT: 11 U/L — ABNORMAL LOW (ref 14–54)
AST: 24 U/L (ref 15–41)
Albumin: 3.1 g/dL — ABNORMAL LOW (ref 3.5–5.0)
Alkaline Phosphatase: 76 U/L (ref 38–126)
Anion gap: 6 (ref 5–15)
BUN: 11 mg/dL (ref 6–20)
CO2: 27 mmol/L (ref 22–32)
Calcium: 8.9 mg/dL (ref 8.9–10.3)
Chloride: 104 mmol/L (ref 101–111)
Creatinine, Ser: 0.66 mg/dL (ref 0.44–1.00)
GFR calc Af Amer: >60 mL/min (ref >60)
GFR calc non Af Amer: >60 mL/min (ref >60)
Glucose, Bld: 96 mg/dL (ref 65–99)
Potassium: 4.1 mmol/L (ref 3.5–5.1)
Sodium: 137 mmol/L (ref 135–145)
Total Bilirubin: 0.7 mg/dL (ref 0.3–1.2)
Total Protein: 7.7 g/dL (ref 6.5–8.1)

## 2018-01-29 LAB — CBC WITH DIFFERENTIAL/PLATELET
Basophils Absolute: 0.1 10*3/uL (ref 0–0.1)
Basophils Relative: 3 %
Eosinophils Absolute: 0.2 10*3/uL (ref 0–0.7)
Eosinophils Relative: 4 %
HCT: 42.7 % (ref 35.0–47.0)
Hemoglobin: 13.7 g/dL (ref 12.0–16.0)
Lymphocytes Relative: 23 %
Lymphs Abs: 1.2 10*3/uL (ref 1.0–3.6)
MCH: 26.8 pg (ref 26.0–34.0)
MCHC: 32 g/dL (ref 32.0–36.0)
MCV: 83.8 fL (ref 80.0–100.0)
Monocytes Absolute: 0.2 10*3/uL (ref 0.2–0.9)
Monocytes Relative: 5 %
Neutro Abs: 3.6 10*3/uL (ref 1.4–6.5)
Neutrophils Relative %: 65 %
Platelets: 1245 10*3/uL (ref 150–400)
RBC: 5.1 MIL/uL (ref 3.80–5.20)
RDW: 34.7 % — ABNORMAL HIGH (ref 11.5–14.5)
WBC: 5.5 10*3/uL (ref 3.6–11.0)
nRBC: 1 /100 WBC — ABNORMAL HIGH

## 2018-01-29 LAB — PROTIME-INR
INR: 1.86
Prothrombin Time: 21.3 seconds — ABNORMAL HIGH (ref 11.4–15.2)

## 2018-01-29 MED ORDER — RUXOLITINIB PHOSPHATE 10 MG PO TABS: 10.0000 mg | ORAL_TABLET | Freq: Two times a day (BID) | ORAL | 0 refills | Status: DC

## 2018-01-29 MED FILL — JAKAFI 10 MG TABLET: 10 | 30 days supply | Qty: 60 | Fill #0

## 2018-01-29 NOTE — Telephone Encounter (Signed)
Oral Oncology Patient Advocate Encounter  Sent email to [facility] to please mail patients Jakafi. They will have to order. Patient has [facility] for her copay of $43.50.   [facility] Patient Advocate [phone] 01/29/2018 4:26 PM

## 2018-01-29 NOTE — Telephone Encounter (Signed)
Oral Oncology Patient Advocate Encounter  Was successful in securing patient a yearly grant from [employer] to provide copayment coverage for her [place].  This will keep the out of pocket expense at $0.    I have spoken with the patient.    The billing information is as follows and has been shared with [place].   Member ID: [id number] Group ID: [facility] RxBin: [id number] PCP: [id number] Dates of Eligibility: 01/29/2018 through 01/30/2019  [place] Patient Advocate [phone] 01/29/2018 3:53 PM

## 2018-01-29 NOTE — Progress Notes (Signed)
[place] [facility] day:  01/29/2018   Chief Complaint: Yolanda Wells is a 84 y.o. female with polycythemia rubra vera (PV) who is seen for review of interval bone marrow and discussion regarding direction of therapy.  HPI:   The patient was last seen in the medical oncology clinic on 12/23/2017.  At that time,  she felt "fine". She was taking hydroxyurea 25 pills/week. She continueed on Coumadin as prescribed.  Hematocrit was 38.8  Platelet count was 1,332,000.  INR was 2.86.  Hydroxyurea was increased to 4 pills 6 days/week and 3 pills 1 day/week.  Bone marrow on 01/07/2018 revealed a hypercellular bone marrow with a myeloproliferative neoplasm.  There was mild to moderate reticulin fibrosis.  Flow cytometry revealed no increased blasts.  Cytogenetics revealed 60, XX, der(15)t(1;15)(q11;p11).  The der 15t(1;15) has been associated with polycythemia, AML, and MDS.  During the interim, she has done well.  She denies any bruising or bleeding.  She has been taking Coumadin 4 mg 6 days/week and 3 mg 1 day/week (total weekly dose: 27 mg).  She notes an ostomy problem today (leakage).   Past Medical History:  Diagnosis Date  . Acute deep vein thrombosis (DVT) of distal vein of left lower extremity ([place]) 10/03/2015  . Acute pulmonary embolism ([place]) 10/03/2015  . Anticoagulant long-term use   . Arthritis   . CHF (congestive heart failure) ([place])   . Chronic anticoagulation 10/03/2015  . Collagen vascular disease ([place])   . Colostomy in place [facility]) 10/04/2015  . Deep venous thrombosis (H[facility])    right lower extremity  . Dependent edema   . Diverticulosis of intestine with bleeding 04/02/2015  . Glenohumeral arthritis 06/28/2012  . Hammertoe 09/02/2012  . History of bilateral hip replacements 09/02/2012  . History of hysterectomy   . Hypertension   . Hypokalemia   . Inability to ambulate due to hip 10/12/2015  . Mandibular dysfunction   . Onychomycosis  09/02/2012  . Osteoarthritis   . Polycythemia vera ([place])   . Presence of IVC filter 05/25/2015  . Stroke ([place]) 03/26/2015   cerebellar  . Urinary incontinence in female 09/02/2016    Past Surgical History:  Procedure Laterality Date  . ABDOMINAL HYSTERECTOMY    . BLEPHAROPLASTY Right 03/2017   right upper eyelid  . CARDIAC CATHETERIZATION Right 04/03/2015   Procedure: CENTRAL LINE INSERTION;  Surgeon: Sherri Rad, MD;  Location: [facility] ORS;  Service: General;  Laterality: Right;  . COLECTOMY WITH COLOSTOMY CREATION/HARTMANN PROCEDURE N/A 04/03/2015   Procedure: COLECTOMY WITH COLOSTOMY CREATION/HARTMANN PROCEDURE;  Surgeon: Sherri Rad, MD;  Location: [facility] ORS;  Service: General;  Laterality: N/A;  . COLON SURGERY    . FOOT AMPUTATION     partial  . FOOT AMPUTATION Right   . JOINT REPLACEMENT     Left and Right Hip  . PERIPHERAL VASCULAR CATHETERIZATION N/A 04/02/2015   Procedure: Visceral Angiography;  Surgeon: Algernon Huxley, MD;  Location: [place] CV LAB;  Service: Cardiovascular;  Laterality: N/A;  . PERIPHERAL VASCULAR CATHETERIZATION N/A 04/02/2015   Procedure: Visceral Artery Intervention;  Surgeon: Algernon Huxley, MD;  Location: [place] CV LAB;  Service: Cardiovascular;  Laterality: N/A;  . PERIPHERAL VASCULAR CATHETERIZATION N/A 05/25/2015   Procedure: IVC Filter Insertion;  Surgeon: Katha Cabal, MD;  Location: [place] [facility];  Service: Cardiovascular;  Laterality: N/A;  . TOE AMPUTATION     right  . TOTAL HIP ARTHROPLASTY  Family History  Problem Relation Age of Onset  . Brain cancer Mother   . Heart attack Father   . COPD Father   . Coronary artery disease Father   . Hypertension Unknown   . Arthritis-Osteo Unknown   . Diabetes Brother   . Arthritis Brother   . Osteosarcoma Son   . Bone cancer Son   . Arthritis Sister   . Arthritis Brother   . Hypertension Brother   . Coronary artery disease Brother     Social History:  reports that she has  never smoked. She has never used smokeless tobacco. She reports that she does not drink alcohol or use drugs.  She lives alone on [address] at [facility] (independent living).  She moved out of [facility].  She has been living with her grand daughter since 02/06/2017.  Her grand-daughter visits regularly [name] [phone]).  The patient is accompanied by her grand daughter, [name], today.   Allergies:  Allergies  Allergen Reactions  . Sulfamethoxazole-Trimethoprim Nausea And Vomiting    Current Medications: Current Outpatient Medications  Medication Sig Dispense Refill  . amLODipine (NORVASC) 5 MG tablet Take 5 mg by mouth daily.    [name] aspirin EC 81 MG tablet Take 1 tablet (81 mg total) by mouth daily. 30 tablet 2  . docusate sodium (COLACE) 100 MG capsule Take 100 mg by mouth 2 (two) times daily.     . folic acid (FOLVITE) 517 MCG tablet Take 400 mcg by mouth daily.    . hydroxyurea (HYDREA) 500 MG capsule TAKE 4 CAPSULES BY MOUTH DAILY 120 capsule 0  . ipratropium-albuterol (DUONEB) 0.5-2.5 (3) MG/3ML SOLN Take 3 mLs by nebulization every 4 (four) hours as needed.    . polyethylene glycol (MIRALAX / GLYCOLAX) packet Take 17 g by mouth daily.    . potassium chloride (K-DUR,KLOR-CON) 10 MEQ tablet Take 10 mEq by mouth as needed. TAKE WITH LASIX PRN    . torsemide (DEMADEX) 10 MG tablet Take 10 mg by mouth daily as needed.     . triamcinolone cream (KENALOG) 0.1 % Apply 1 application topically 2 (two) times daily. To affected area    . warfarin (COUMADIN) 3 MG tablet TAKE ONE TABLET ALONG WITH '1MG'$  TABLET FOR TOTAL 26F '4MG'$  4 DAYS PER WEEK, TAKE 3.'5MG'$  FOR 3 DAYS A WEEK. (Patient taking differently: Take 3 mg by mouth. 6 DAYS A WEEK AT BEDTIME) 30 tablet 0  . warfarin (COUMADIN) 4 MG tablet 6 days a week (Patient taking differently: Take 4 mg by mouth once a week. Wednesday NIGHT) 30 tablet   . ENSURE (ENSURE) Take 1 Can by mouth 3 (three) times daily with meals.    [name] LORazepam  (ATIVAN) 0.5 MG tablet Take 0.5 mg by mouth daily.     . ruxolitinib phosphate (JAKAFI) 10 MG tablet Take 1 tablet (10 mg total) by mouth 2 (two) times daily. (Patient not taking: Reported on 01/29/2018) 60 tablet 0   No current facility-administered medications for this visit.     Review of Systems:  GENERAL:  Feels "fine".  No fevers or sweats. No new weight.  PERFORMANCE STATUS (ECOG):  3 HEENT:  Decreased vision in right eye s/p embolic event.  Vision better s/p entropion surgery.  No runny nose, sore throat, mouth sores or tenderness. Lungs:  No shortness of breath or cough.  No hemoptysis. Cardiac:  No chest pain, palpitations, or PND.  Orthopnea.  Interval admission for CHF. GI:  Ostomy issue (leakage)  today.  No nausea, vomiting, diarrhea, constipation, melena or hematochezia. GU:  No urgency, frequency, dysuria, or hematuria.  Incontinent.  Wears adult diapers. Interval UTI. Musculoskeletal:  Knee problems ("bone on bone").  No muscle tenderness. Extremities:  Chronic swelling in legs. Skin:  Dry skin.  No excess bruising. Neuro:  No headache, numbness or weakness, balance or coordination issues. Endocrine:  No diabetes, thyroid issues, hot flashes or night sweats. Psych:  No mood changes, depression or anxiety. Pain:  No focal pain today. Review of systems:  All other systems reviewed and found to be negative.  Physical Exam: Blood pressure (!) 141/85, pulse 80, temperature 98.6 F (37 C), temperature source Tympanic, resp. rate 16. GENERAL:  Elderly woman sitting comfortably in a wheelchair in the exam room in no acute distress. MENTAL STATUS:  Alert and oriented to person, place and time. HEAD: Wearing a cap.  Gray hair.  Normocephalic, atraumatic, face symmetric, no Cushingoid features. EYES:  Brown eyes.  Pupils equal round and reactive to light and accomodation.  No conjunctivitis or scleral icterus. ENT:  Oropharynx clear without lesion.  Upper dentures.  Tongue normal.  Mucous membranes moist.  RESPIRATORY:  Clear to auscultation without rales, wheezes or rhonchi. CARDIOVASCULAR:  Regular rate and rhythm without murmur, rub or gallop. ABDOMEN:  Left sided colostomy.  Active bowel sounds.  Unable to assess organomegaly (patient requested no palpation secondary to ostomy issue). SKIN:  No rashes or ulcers. EXTREMITIES:  Chronic lower extremity changes.  No skin discoloration or tenderness.  No palpable cords. LYMPH NODES: No palpable cervical, supraclavicular, axillary or inguinal adenopathy  NEUROLOGICAL: Unremarkable. PSYCH:  Appropriate.   Appointment on 01/29/2018  Component Date Value Ref Range Status  . Prothrombin Time 01/29/2018 21.3* 11.4 - 15.2 seconds Final  . INR 01/29/2018 1.86   Final   Performed at [facility], [address] [place], [place] [address]  . Sodium 01/29/2018 137  135 - 145 mmol/L Final  . Potassium 01/29/2018 4.1  3.5 - 5.1 mmol/L Final  . Chloride 01/29/2018 104  101 - 111 mmol/L Final  . CO2 01/29/2018 27  22 - 32 mmol/L Final  . Glucose, Bld 01/29/2018 96  65 - 99 mg/dL Final  . BUN 01/29/2018 11  6 - 20 mg/dL Final  . Creatinine, Ser 01/29/2018 0.66  0.44 - 1.00 mg/dL Final  . Calcium 01/29/2018 8.9  8.9 - 10.3 mg/dL Final  . Total Protein 01/29/2018 7.7  6.5 - 8.1 g/dL Final  . Albumin 01/29/2018 3.1* 3.5 - 5.0 g/dL Final  . AST 01/29/2018 24  15 - 41 U/L Final  . ALT 01/29/2018 11* 14 - 54 U/L Final  . Alkaline Phosphatase 01/29/2018 76  38 - 126 U/L Final  . Total Bilirubin 01/29/2018 0.7  0.3 - 1.2 mg/dL Final  . GFR calc non Af Amer 01/29/2018 >60  >60 mL/min Final  . GFR calc Af Amer 01/29/2018 >60  >60 mL/min Final   Comment: (NOTE) The eGFR has been calculated using the CKD EPI equation. This calculation has not been validated in all clinical situations. eGFR's persistently <60 mL/min signify possible Chronic Kidney Disease.   Georgiann Hahn gap 01/29/2018 6  5 - 15 Final   Performed at [facility], [address] [place], [place] [address]  . WBC 01/29/2018 5.5  3.6 - 11.0 K/uL Final  . RBC 01/29/2018 5.10  3.80 - 5.20 MIL/uL Final  . Hemoglobin 01/29/2018 13.7  12.0 - 16.0 g/dL  Final  . HCT 01/29/2018 42.7  35.0 - 47.0 % Final  . MCV 01/29/2018 83.8  80.0 - 100.0 fL Final  . MCH 01/29/2018 26.8  26.0 - 34.0 pg Final  . MCHC 01/29/2018 32.0  32.0 - 36.0 g/dL Final  . RDW 01/29/2018 34.7* 11.5 - 14.5 % Final  . Platelets 01/29/2018 1,245* 150 - 400 K/uL Final   Comment: RESULT REPEATED AND VERIFIED PLATELET COUNT CONFIRMED BY SMEAR CRITICAL RESULT CALLED TO, READ BACK BY AND VERIFIED WITH: BRYAN GRAY @ 2:47 PM 01/29/2018 LGR PLATELETS VARY IN SIZE GIANT PLATELETS SEEN   . Neutrophils Relative % 01/29/2018 65  % Final  . Neutro Abs 01/29/2018 3.6  1.4 - 6.5 K/uL Final  . Lymphocytes Relative 01/29/2018 23  % Final  . Lymphs Abs 01/29/2018 1.2  1.0 - 3.6 K/uL Final  . Monocytes Relative 01/29/2018 5  % Final  . Monocytes Absolute 01/29/2018 0.2  0.2 - 0.9 K/uL Final  . Eosinophils Relative 01/29/2018 4  % Final  . Eosinophils Absolute 01/29/2018 0.2  0 - 0.7 K/uL Final  . Basophils Relative 01/29/2018 3  % Final  . Basophils Absolute 01/29/2018 0.1  0 - 0.1 K/uL Final  . RBC Morphology 01/29/2018 RARE NRBCs   Corrected  . nRBC 01/29/2018 1* 0 /100 WBC Corrected   Performed at [facility], [address] [place], [place] [address]    Assessment:  Yolanda Wells is a 84 y.o. female African-American woman with JAK2+ polycythemia rubra vera (PV) previously on a phlebotomy program and hydroxyurea. She received P32 in an attempt to manage her counts in 03/2015 and on 05/14/2017.  Bone marrow on 01/07/2018 revealed a hypercellular bone marrow with a myeloproliferative neoplasm.  There was mild to moderate reticulin fibrosis.  Flow cytometry revealed no increased blasts.  Cytogenetics revealed 63, XX, der(15)t(1;15)(q11;p11).  The der 15t(1;15) has been associated with  polycythemia, AML, and MDS.  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 She was diagnosed with bilateral lower extremity DVTs on 05/18/2015 and bilateral pulmonary emboli on 05/24/2015. She underwent IVC filter placement on 05/25/2015.  She has been on a fluctuating dose of Coumadin secondary to unstable INR.  She is on a baby aspirin.  She developed progressive erythrocytosis, thrombocytosis, and leukocytosis.  She underwent phlebotomy for a hematocrit of 55.4 on 09/23/2015.  Hematocrit decreased to 50.0, but has again increased after initiation of oral iron.  Platelet count has increased from 1.1 million to 1.4 million.  White count increased from 23,000 - 28,000 to 31,800.  She was admitted on 08/25/2016 with loss of vision in her right eye.  CBC on 08/25/2016 revealed a hematocrit of 40.0, hemoglobin 13.5, MCV 91, platelets 842,000, WBC 4900.  INR was 2.59.  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  MRI and MRA showed no acute changes.  She has been back on hydroxyurea since 10/02/2015.  Initial dose was 1000 mg a day.  She is currently taking 4 pills 6 days/week and 3 pills 1 days/week (total weekly dose: 27 pills).   She requires periodic phlebotomies (goal hematocrit <= 42).  Platelet count remains elevated (secondary to PV and likely some component of iron deficiency).  Goal platelet count is 400,000.  She is on Coumadin 4 mg 6 days/week and 3 mg 1 day/week  (total weekly dose 27 mg).  INR goal 2-3.  She was admitted to  [facility] for 2.5 weeks with pneumonia then returned 07/03/2017 - 07/12/2017 with CHF.  She was admitted to [facility] from 09/26/2017 - 10/09/2017 with symptoms of heart failure and an E coli UTI.  She was treated with Lasix and ceftriaxone.  Echo revealed an EF of 60%.  CXR suggested pulmonary artery hypertension.     Symptomatically, she feels "fine". She is taking hydroxyurea 25 pills/week. She continues on Coumadin.  Exam is stable.  Hematocrit is 32.7  Platelet count is 1,245,000.  INR is 2.86  Plan: 1.  Labs today:  CBC with diff, CMP, PT/INR. 2.  Review interval bone marrow- increased reticulin fibrosis.  No increased blasts.  Abnormal cytogenetics. 3.  INR 1.86. Increase Coumadin '4mg'$  on 6 days a week and '5mg'$  on Thursdays (total weekly dose of 29 mg). 4.  Discuss transition from Hydrea to [facility]. Clearnce Sorrel, PharmD to room to provide formal education. Patient consented to initiation of Jakafi.  Will initially be on both, with plans to taper off her off the Hydrea. Patient will initially be on Hydrea 4 pills/day in addition to Jakafi 10 mg BID.   Anticipate weekly taper of hydroxyurea.  Increase Jakafi monthly if patient has not met goal (platelet < 400,000). 5.  RTC for weekly labs (CBC with diff, INR). 6.  RTC in 3 weeks for MD assessment, labs (CBC with diff, CMP, PT/INR).   Honor Loh, NP  01/29/2018, 3:23 PM   I saw and evaluated the patient, participating in the key portions of the service and reviewing pertinent diagnostic studies and records.  I reviewed the nurse practitioner's note and agree with the findings and the plan.  The assessment and plan were discussed with the patient.  Multiple questions were asked by the patient and answered.   Nolon Stalls, MD 01/29/2018, 3:23 PM

## 2018-01-29 NOTE — Telephone Encounter (Signed)
Oral Oncology Patient Advocate Encounter  Prior Authorization for Shanon Brow has been approved.    PA# [id number] Effective dates: 12/30/17 through 01/29/2019  Oral Oncology Clinic will continue to follow.  Co-pay $43.50   [place] Patient Advocate [phone] 01/29/2018 3:21 PM

## 2018-01-29 NOTE — Telephone Encounter (Signed)
Oral Oncology Patient Advocate Encounter  Received notification from [employer] that prior authorization for Yolanda Wells is required.  PA submitted on CoverMyMeds Key [id number] Status is pending  Oral Oncology Clinic will continue to follow.  [place] Patient Advocate [phone] 01/29/2018 3:19 PM

## 2018-01-29 NOTE — Telephone Encounter (Signed)
Oral Oncology Pharmacist Encounter  Received new prescription for Jakafi (ruxolitinib) for the treatment of polycythemia rubra vera (PV), planned duration until disease progression or unacceptable drug toxicity.  CBC from 01/08/18 assessed, no relevant lab abnormalities. Prescription dose and frequency assessed.   Current medication list in Epic reviewed, no DDIs with Jakafi identified.   Prescription has been e-scribed to the [facility] for benefits analysis and approval.  Patient education Counseled patient and her granddaughter Yolanda Wells following their office visit. Reviewed administration, dosing, side effects, monitoring, drug-food interactions, safe handling, storage, and disposal. Patient will take 1 tablet (10 mg total) by mouth 2 (two) times daily.  Side effects include but not limited to: Decrease hgb/plt, and increase in cholesterol level .    Reviewed with patient importance of keeping a medication schedule and plan for any missed doses.  Yolanda Wells and Yolanda Wells voiced understanding and appreciation. All questions answered. Provided them with medication handout.   Oral Oncology Clinic will continue to follow for insurance authorization, copayment issues, and start date.  Provided patient with [facility] [place] [facility] phone number. Patient knows to call the office with questions or concerns. Oral Chemotherapy Navigation Clinic will continue to follow.  Darl Pikes, PharmD, BCPS Hematology/Oncology Clinical Pharmacist [facility] [place] [facility] [phone]  01/29/2018 1:24 PM

## 2018-01-30 ENCOUNTER — Encounter: Payer: Self-pay | Admitting: Hematology and Oncology

## 2018-02-02 ENCOUNTER — Other Ambulatory Visit: Payer: Self-pay | Admitting: Hematology and Oncology

## 2018-02-02 DIAGNOSIS — D45 Polycythemia vera: Secondary | ICD-10-CM

## 2018-02-02 NOTE — Telephone Encounter (Signed)
Oral Oncology Patient Advocate Encounter  Patient received her Jakafi on 02/01/2018 from [facility].  [place] Patient Advocate [phone] 02/02/2018 8:06 AM

## 2018-02-05 ENCOUNTER — Inpatient Hospital Stay: Payer: Medicare Other

## 2018-02-05 ENCOUNTER — Other Ambulatory Visit: Payer: Self-pay

## 2018-02-05 ENCOUNTER — Encounter: Payer: Self-pay | Admitting: Emergency Medicine

## 2018-02-05 ENCOUNTER — Emergency Department
Admission: EM | Admit: 2018-02-05 | Discharge: 2018-02-05 | Disposition: A | Discharge status: Home / Self Care | Payer: Medicare Other | Attending: Emergency Medicine | Admitting: Emergency Medicine

## 2018-02-05 ENCOUNTER — Emergency Department: Payer: Medicare Other

## 2018-02-05 DIAGNOSIS — R2242 Localized swelling, mass and lump, left lower limb: Secondary | ICD-10-CM | POA: Diagnosis present

## 2018-02-05 DIAGNOSIS — I824Z2 Acute embolism and thrombosis of unspecified deep veins of left distal lower extremity: Secondary | ICD-10-CM | POA: Insufficient documentation

## 2018-02-05 DIAGNOSIS — I110 Hypertensive heart disease with heart failure: Secondary | ICD-10-CM | POA: Insufficient documentation

## 2018-02-05 DIAGNOSIS — R2241 Localized swelling, mass and lump, right lower limb: Secondary | ICD-10-CM | POA: Insufficient documentation

## 2018-02-05 DIAGNOSIS — Z96643 Presence of artificial hip joint, bilateral: Secondary | ICD-10-CM | POA: Diagnosis not present

## 2018-02-05 DIAGNOSIS — Z7901 Long term (current) use of anticoagulants: Secondary | ICD-10-CM | POA: Diagnosis not present

## 2018-02-05 DIAGNOSIS — J449 Chronic obstructive pulmonary disease, unspecified: Secondary | ICD-10-CM | POA: Insufficient documentation

## 2018-02-05 DIAGNOSIS — Z79899 Other long term (current) drug therapy: Secondary | ICD-10-CM | POA: Insufficient documentation

## 2018-02-05 DIAGNOSIS — E876 Hypokalemia: Secondary | ICD-10-CM | POA: Diagnosis not present

## 2018-02-05 DIAGNOSIS — R609 Edema, unspecified: Secondary | ICD-10-CM

## 2018-02-05 DIAGNOSIS — I5032 Chronic diastolic (congestive) heart failure: Secondary | ICD-10-CM | POA: Insufficient documentation

## 2018-02-05 DIAGNOSIS — Z7982 Long term (current) use of aspirin: Secondary | ICD-10-CM | POA: Diagnosis not present

## 2018-02-05 LAB — COMPREHENSIVE METABOLIC PANEL WITH GFR
ALT: 11 U/L — ABNORMAL LOW (ref 14–54)
AST: 20 U/L (ref 15–41)
Albumin: 2.8 g/dL — ABNORMAL LOW (ref 3.5–5.0)
Alkaline Phosphatase: 69 U/L (ref 38–126)
Anion gap: 6 (ref 5–15)
BUN: 21 mg/dL — ABNORMAL HIGH (ref 6–20)
CO2: 28 mmol/L (ref 22–32)
Calcium: 8.5 mg/dL — ABNORMAL LOW (ref 8.9–10.3)
Chloride: 104 mmol/L (ref 101–111)
Creatinine, Ser: 0.59 mg/dL (ref 0.44–1.00)
GFR calc Af Amer: >60 mL/min
GFR calc non Af Amer: >60 mL/min
Glucose, Bld: 97 mg/dL (ref 65–99)
Potassium: 4.2 mmol/L (ref 3.5–5.1)
Sodium: 138 mmol/L (ref 135–145)
Total Bilirubin: 0.8 mg/dL (ref 0.3–1.2)
Total Protein: 7.3 g/dL (ref 6.5–8.1)

## 2018-02-05 LAB — CBC WITH DIFFERENTIAL/PLATELET
Basophils Absolute: 0 K/uL (ref 0–0.1)
Basophils Relative: 1 %
Eosinophils Absolute: 0.1 K/uL (ref 0–0.7)
Eosinophils Relative: 4 %
HCT: 38.5 % (ref 35.0–47.0)
Hemoglobin: 12.6 g/dL (ref 12.0–16.0)
Lymphocytes Relative: 46 %
Lymphs Abs: 1.4 K/uL (ref 1.0–3.6)
MCH: 27.1 pg (ref 26.0–34.0)
MCHC: 32.8 g/dL (ref 32.0–36.0)
MCV: 82.6 fL (ref 80.0–100.0)
Monocytes Absolute: 0.1 K/uL — ABNORMAL LOW (ref 0.2–0.9)
Monocytes Relative: 4 %
Neutro Abs: 1.4 K/uL (ref 1.4–6.5)
Neutrophils Relative %: 45 %
Platelets: 1038 K/uL (ref 150–440)
RBC: 4.66 MIL/uL (ref 3.80–5.20)
RDW: 36.6 % — ABNORMAL HIGH (ref 11.5–14.5)
WBC: 3.1 K/uL — ABNORMAL LOW (ref 3.6–11.0)

## 2018-02-05 LAB — BRAIN NATRIURETIC PEPTIDE: B Natriuretic Peptide: 53 pg/mL (ref 0.0–100.0)

## 2018-02-05 LAB — PROTIME-INR
INR: 1.51
Prothrombin Time: 18.1 s — ABNORMAL HIGH (ref 11.4–15.2)

## 2018-02-05 MED ORDER — POTASSIUM CHLORIDE CRYS ER 20 MEQ PO TBCR
40.0000 meq | EXTENDED_RELEASE_TABLET | Freq: Once | ORAL | Status: AC
  Administered 2018-02-05: 40 meq via ORAL
  Filled 2018-02-05: qty 2

## 2018-02-05 MED ORDER — ELIQUIS 5 MG VTE STARTER PACK: ORAL_TABLET | ORAL | 0 refills | Status: DC

## 2018-02-05 MED ORDER — FUROSEMIDE 40 MG PO TABS
40.0000 mg | ORAL_TABLET | ORAL | Status: AC
  Administered 2018-02-05: 40 mg via ORAL
  Filled 2018-02-05: qty 1

## 2018-02-05 NOTE — ED Provider Notes (Signed)
[facility] Emergency Department Provider Note   ____________________________________________   First MD Initiated Contact with Patient 02/05/18 1421     (approximate)  I have reviewed the triage vital signs and the nursing notes.   HISTORY  Chief Complaint Leg Swelling    HPI Yolanda Wells is a 84 y.o. female history of previous pulmonary embolism, congestive heart failure, DVT as well as multiple other medical conditions.  Patient presents today, she reports that she noticed over the last 2 days that her feet have become more swollen in both lower legs.  Her family called her doctor's office, they recommended she increase her Lasix dosing, and she took 1 dose of Lasix 20 mg earlier today.  She then talked to Dr. because the swelling had persisted and they recommended she come the emergency room to consider getting additional Lasix  Patient denies any pain.  No nausea vomiting no fevers or chills.  She reports she has had swelling in both lower legs before, and she also takes Coumadin a blood thinner because of a known history of blood clot in 1 of the legs.  She denies having any shortness of breath.  She does not feel short of breath when she lays down.  There is been no cough no fevers or chills.  Reports the only symptom that presently ailing her is swelling in both lower legs.   Past Medical History:  Diagnosis Date  . Acute deep vein thrombosis (DVT) of distal vein of left lower extremity ([place]) 10/03/2015  . Acute pulmonary embolism ([place]) 10/03/2015  . Anticoagulant long-term use   . Arthritis   . CHF (congestive heart failure) ([place])   . Chronic anticoagulation 10/03/2015  . Collagen vascular disease ([place])   . Colostomy in place [facility]) 10/04/2015  . Deep venous thrombosis (HCC)    right lower extremity  . Dependent edema   . Diverticulosis of intestine with bleeding 04/02/2015  . Glenohumeral arthritis 06/28/2012  . Hammertoe 09/02/2012  .  History of bilateral hip replacements 09/02/2012  . History of hysterectomy   . Hypertension   . Hypokalemia   . Inability to ambulate due to hip 10/12/2015  . Mandibular dysfunction   . Onychomycosis 09/02/2012  . Osteoarthritis   . Polycythemia vera ([place])   . Presence of IVC filter 05/25/2015  . 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 Urinary incontinence in female 09/02/2016    Patient Active Problem List   Diagnosis Date Noted  . Goals of care, counseling/discussion 12/23/2017  . Palliative care by specialist   . DNR (do not resuscitate) discussion   . Weakness generalized   . Hypoxia 10/13/2017  . Acute CHF (congestive heart failure) ([place]) 10/06/2017  . Chronic diastolic CHF (congestive heart failure) ([place]) 07/28/2017  . Dyspnea 05/27/2017  . Ileus ([place]) 04/10/2017  . Influenza with respiratory manifestation 12/01/2016  . Respiratory distress 11/23/2016  . Urinary incontinence in female 09/02/2016  . Vision loss of right eye 08/26/2016  . Blindness of right eye 08/26/2016  . Inability to ambulate due to hip 10/12/2015  . Colostomy in place [facility]) 10/04/2015  . Long term current use of anticoagulant therapy 10/03/2015  . Acute pulmonary embolism ([place]) 10/03/2015  . Leukocytosis 09/25/2015  . Chest pain 09/25/2015  . COPD exacerbation ([place]) 09/24/2015  . CAP (community acquired pneumonia) 09/24/2015  . COPD (chronic obstructive pulmonary disease) ([place]) 09/24/2015  . SOB (shortness of breath) 09/22/2015  . Elevated troponin 09/15/2015  . UTI (urinary tract  infection) 07/16/2015  . Abdominal wall cellulitis 07/15/2015  . DVT (deep venous thrombosis) ([place]) 07/15/2015  . HTN (hypertension) 07/15/2015  . Polycythemia vera ([place]) 07/15/2015  . Dependent edema 07/15/2015  . Arthritis 07/15/2015  . Pulmonary emboli ([place]) 05/25/2015  . Diverticulosis of colon with hemorrhage 04/02/2015  . Diverticulosis of intestine with bleeding 04/02/2015  . Dehydration, moderate  01/11/2015  . Fall 01/10/2015  . Osteoarthritis 01/10/2015  . Hammertoe 09/02/2012  . S/P transmetatarsal amputation of foot ([place]) 09/02/2012  . Onychomycosis 09/02/2012  . Other specified dermatoses 09/02/2012  . Hip pain 08/23/2012  . S/P hip replacement 08/23/2012  . Glenohumeral arthritis 06/28/2012    Past Surgical History:  Procedure Laterality Date  . ABDOMINAL HYSTERECTOMY    . BLEPHAROPLASTY Right 03/2017   right upper eyelid  . CARDIAC CATHETERIZATION Right 04/03/2015   Procedure: CENTRAL LINE INSERTION;  Surgeon: Sherri Rad, MD;  Location: [facility] ORS;  Service: General;  Laterality: Right;  . COLECTOMY WITH COLOSTOMY CREATION/HARTMANN PROCEDURE N/A 04/03/2015   Procedure: COLECTOMY WITH COLOSTOMY CREATION/HARTMANN PROCEDURE;  Surgeon: Sherri Rad, MD;  Location: [facility] ORS;  Service: General;  Laterality: N/A;  . COLON SURGERY    . FOOT AMPUTATION     partial  . FOOT AMPUTATION Right   . JOINT REPLACEMENT     Left and Right Hip  . PERIPHERAL VASCULAR CATHETERIZATION N/A 04/02/2015   Procedure: Visceral Angiography;  Surgeon: Algernon Huxley, MD;  Location: [place] [facility];  Service: Cardiovascular;  Laterality: N/A;  . PERIPHERAL VASCULAR CATHETERIZATION N/A 04/02/2015   Procedure: Visceral Artery Intervention;  Surgeon: Algernon Huxley, MD;  Location: [place] [facility];  Service: Cardiovascular;  Laterality: N/A;  . PERIPHERAL VASCULAR CATHETERIZATION N/A 05/25/2015   Procedure: IVC Filter Insertion;  Surgeon: Katha Cabal, MD;  Location: [place] [facility];  Service: Cardiovascular;  Laterality: N/A;  . TOE AMPUTATION     right  . TOTAL HIP ARTHROPLASTY      Prior to Admission medications   Medication Sig Start Date End Date Taking? Authorizing Provider  amLODipine (NORVASC) 5 MG tablet Take 5 mg by mouth daily.   Yes [provider]  aspirin EC 81 MG tablet Take 1 tablet (81 mg total) by mouth daily. 09/16/15  Yes [name], MD  docusate sodium  (COLACE) 100 MG capsule Take 100 mg by mouth 2 (two) times daily.    Yes [provider]  ENSURE (ENSURE) Take 1 Can by mouth 3 (three) times daily with meals.   Yes [provider]  folic acid (FOLVITE) 426 MCG tablet Take 400 mcg by mouth daily.   Yes [provider]  furosemide (LASIX) 20 MG tablet Take 20 mg by mouth daily.   Yes [provider]  hydroxyurea (HYDREA) 500 MG capsule TAKE 4 CAPSULES BY MOUTH DAILY 02/02/18  Yes Corcoran, Melissa C, MD  ipratropium-albuterol (DUONEB) 0.5-2.5 (3) MG/3ML SOLN Take 3 mLs by nebulization every 4 (four) hours as needed.   Yes [provider]  polyethylene glycol (MIRALAX / GLYCOLAX) packet Take 17 g by mouth daily.   Yes [provider]  potassium chloride (K-DUR,KLOR-CON) 10 MEQ tablet Take 10 mEq by mouth as needed. TAKE WITH LASIX PRN   Yes [provider]  ruxolitinib phosphate (JAKAFI) 10 MG tablet Take 1 tablet (10 mg total) by mouth 2 (two) times daily. 01/29/18  Yes Lequita Asal, MD  warfarin (COUMADIN) 3 MG tablet TAKE ONE TABLET ALONG WITH 1MG  TABLET  FOR TOTAL 41F 4MG  4 DAYS PER WEEK, TAKE 3.5MG  FOR 3 DAYS A WEEK. Patient taking differently: Take 4 mg by mouth. 6 DAYS A WEEK AT BEDTIME 01/07/18  Yes Karen Kitchens, NP  warfarin (COUMADIN) 4 MG tablet 6 days a week Patient taking differently: Take 5 mg by mouth once a week. Wednesday NIGHT 12/04/17  Yes Lequita Asal, MD    Allergies Sulfamethoxazole-trimethoprim  Family History  Problem Relation Age of Onset  . Brain cancer Mother   . Heart attack Father   . COPD Father   . Coronary artery disease Father   . Hypertension Unknown   . Arthritis-Osteo Unknown   . Diabetes Brother   . Arthritis Brother   . Osteosarcoma Son   . Bone cancer Son   . Arthritis Sister   . Arthritis Brother   . Hypertension Brother   . Coronary artery disease Brother     Social History Social History   Tobacco Use  . Smoking  status: Never Smoker  . Smokeless tobacco: Never Used  Substance Use Topics  . Alcohol use: No  . Drug use: No    Review of Systems Constitutional: No fever/chills Eyes: No visual changes. ENT: No sore throat. Cardiovascular: Denies chest pain. Respiratory: Denies shortness of breath. Gastrointestinal: No abdominal pain.  No nausea, no vomiting.  No diarrhea.  No constipation. Genitourinary: Negative for dysuria. Musculoskeletal: Negative for back pain.  Swelling in both lower legs.  Particularly around the ankles. Skin: Negative for rash. Neurological: Negative for headaches, focal weakness or numbness.    ____________________________________________   PHYSICAL EXAM:  VITAL SIGNS: ED Triage Vitals  Enc Vitals Group     BP 02/05/18 1322 (!) 153/80     Pulse Rate 02/05/18 1322 83     Resp 02/05/18 1322 16     Temp 02/05/18 1322 97.6 F (36.4 C)     Temp Source 02/05/18 1322 Oral     SpO2 02/05/18 1318 100 %     Weight --      Height --      Head Circumference --      Peak Flow --      Pain Score 02/05/18 1319 0     Pain Loc --      Pain Edu? --      Excl. in [place]? --     Constitutional: Alert and oriented to self, place and situation. Well appearing and in no acute distress. Eyes: Conjunctivae are normal. Head: Atraumatic. Nose: No congestion/rhinnorhea. Mouth/Throat: Mucous membranes are moist. Neck: No stridor.   Cardiovascular: Normal rate, regular rhythm. Grossly normal heart sounds.  Good peripheral circulation. Respiratory: Normal respiratory effort.  No retractions. Lungs CTAB. Gastrointestinal: Soft and nontender. No distention. Musculoskeletal: No lower extremity tenderness though she does have 1+ bilateral lower extremity pitting edema.  There is no calf or thigh tenderness.  No venous cords or congestion. Neurologic:  Normal speech and language. No gross focal neurologic deficits are appreciated.  Skin:  Skin is warm, dry and intact. No rash  noted. Psychiatric: Mood and affect are normal. Speech and behavior are normal.  ____________________________________________   LABS (all labs ordered are listed, but only abnormal results are displayed)  Labs Reviewed  CBC WITH DIFFERENTIAL/PLATELET - Abnormal; Notable for the following components:      Result Value   WBC 3.1 (*)    RDW 36.6 (*)    Platelets 1,038 (*)    Monocytes Absolute 0.1 (*)  All other components within normal limits  COMPREHENSIVE METABOLIC PANEL - Abnormal; Notable for the following components:   BUN 21 (*)    Calcium 8.5 (*)    Albumin 2.8 (*)    ALT 11 (*)    All other components within normal limits  PROTIME-INR - Abnormal; Notable for the following components:   Prothrombin Time 18.1 (*)    All other components within normal limits  BRAIN NATRIURETIC PEPTIDE   ____________________________________________  EKG   ____________________________________________  RADIOLOGY  Lower extremity venous Doppler pending as well as chest x-ray at the time of signout to Dr. Mariea Clonts ____________________________________________   PROCEDURES  Procedure(s) performed: None  Procedures  Critical Care performed: No  ____________________________________________   INITIAL IMPRESSION / ASSESSMENT AND PLAN / ED COURSE  Pertinent labs & imaging results that were available during my care of the patient were reviewed by me and considered in my medical decision making (see chart for details).  Patient presents for evaluation of lower extremity edema.  Appears to have what appears to be equal bilateral lower extremity edema.  Evidently over the last 2 days this is come up, she has had previous with this due to congestive heart failure in the past.  She did take 1 extra dose of Lasix yesterday, but has not seen any improvement in symptoms.  She denies any symptoms associated with pulmonary edema.  She does not have any hypoxia or tachycardia.  No pleuritic  symptoms and no chest pain.  She is anticoagulated on Coumadin at present.  Clinical Course as of Feb 05 1553  Fri Feb 05, 2018  1323 Initial ED physician evaluation somewhat delayed due to a critical patient who I needed to evaluate emergently.   [MQ]    Clinical Course User Index [MQ] Delman Kitten, MD    Patient's labs reviewed, her INR is slightly subtherapeutic.  Her BNP is normal, and she does not exhibit any symptoms to suggest volume overload affecting her breathing.  No evidence of pulmonary edema by clinical exam.  They believe a reasonable plan will be to double her dose of Lasix for likely the next 3 days.  Discussed this with the patient, also have ordered venous Doppler to evaluate and exclude DVT given her slightly subtherapeutic INR, additionally chest x-ray to evaluate and exclude pulmonary edema given concern of volume overload.  Ongoing care and disposition assigned to Dr. Mariea Clonts with plan to follow-up on venous ultrasound as well as chest x-ray imaging.  Likely discharged home based on results of these tests with close primary care follow-up in doubling dose of Lasix for the next 3 days advised. ____________________________________________   FINAL CLINICAL IMPRESSION(S) / ED DIAGNOSES  Final diagnoses:  Peripheral edema      NEW MEDICATIONS STARTED DURING THIS VISIT:  New Prescriptions   No medications on file     Note:  This document was prepared using Dragon voice recognition software and may include unintentional dictation errors.     Delman Kitten, MD 02/05/18 [phone]

## 2018-02-05 NOTE — ED Triage Notes (Signed)
Pt to ED via [facility] from home for lower leg edema. Pt has hx/o CHF, takes 40 mg of lasix QD. Family noticed increase in swelling this morning. Pt in NAD at this time.

## 2018-02-05 NOTE — ED Provider Notes (Signed)
This patient was signed out to me by Dr. Elta Guadeloupe quality.  84 year old female who is on Coumadin for history of DVT remotely.  She also has polycythemia vera.  She presented today for bilateral lower extremity swelling which was thought to be most likely due to peripheral edema.  Her CHF workup here is reassuring with no pulmonary edema.  Her ultrasound studies did show left lower extremity DVT and I spoke with the radiologist who states this appears to be a new blood clot.  She is subtherapeutic on her INR and it is well documented in her notes that she has a difficult to control INR.  I have spoken with her hematologist, Dr. Mike Gip, who recommends discontinuing Coumadin and starting Eliquis today.  She will be followed up in clinic early next week.  She is also recently been started on a new medication for her polycythemia vera and it appears that her platelet count is responding to this.  The patient is mildly hypokalemic today, likely due to her Lasix use, and I have supplemented her with potassium.  I have asked her to follow-up with her primary care physician to have this rechecked.  At this time, the patient is hemodynamically stable and will be discharged home.  Return precautions as well as follow-up instructions were discussed.   Eula Listen, MD 02/05/18 [phone]

## 2018-02-05 NOTE — Discharge Instructions (Addendum)
Call 911 to return to the emergency room right away if you develop shortness of breath, fever, trouble breathing, chest pain, weakness, fatigue, or other new concerns arise.  Today, your ultrasound did show a blood clot in your leg.  I have spoken with Dr. Mike Gip, and we will plan to stop your Coumadin completely, and start a blood thinner medication called Eliquis.  Please make sure you throw away any old bottles of Coumadin so that you do not take both of these medications simultaneously.  Your potassium level was also slightly low today.  Please have your primary care physician recheck your potassium level.

## 2018-02-12 ENCOUNTER — Inpatient Hospital Stay: Payer: Medicare Other

## 2018-02-16 IMAGING — CT CT ANGIO CHEST
1 of 6 series · 19 of 36 positions shown · IV contrast (APPLIED)
Comparison: 09/24/2015

CLINICAL DATA: Dyspnea, cough, wheezing, shortness of breath.

EXAM:
CT ANGIOGRAPHY CHEST WITH CONTRAST
TECHNIQUE: Multidetector CT imaging of the chest was performed using the
standard protocol during bolus administration of intravenous
contrast. Multiplanar CT image reconstructions and MIPs were
obtained to evaluate the vascular anatomy.
CONTRAST:  75 mL Isovue 370

[Series 5: thins · axial · 0.70mm/px · z∈[-96,+176]mm · 19 of 304 slices shown]
[im 16/304  lung]
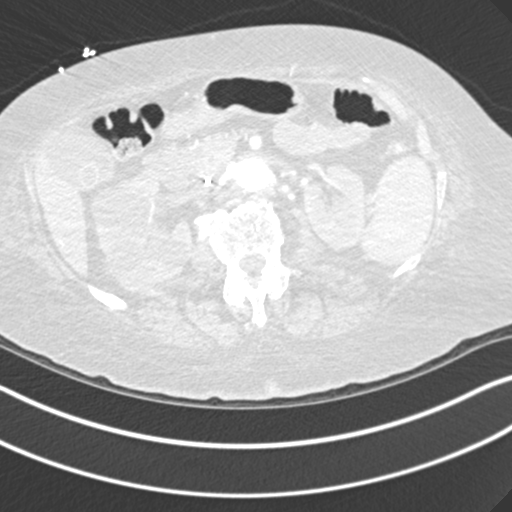
[im 31/304  mediastinal]
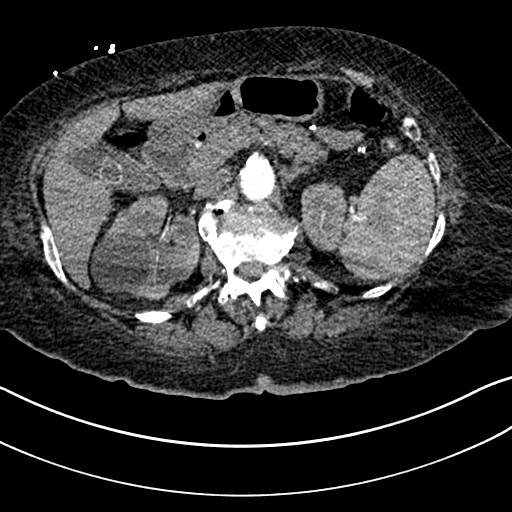
[im 46/304  lung]
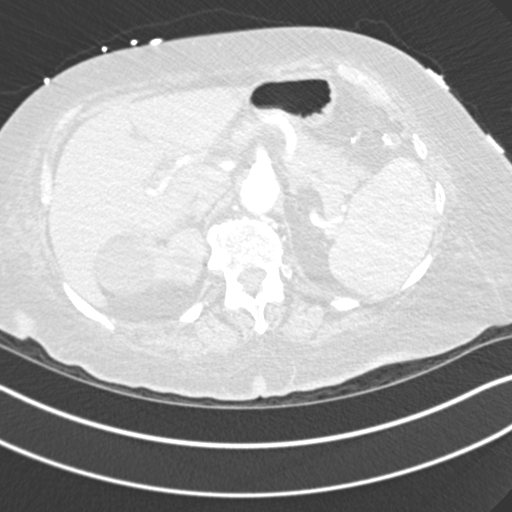
[im 61/304  mediastinal]
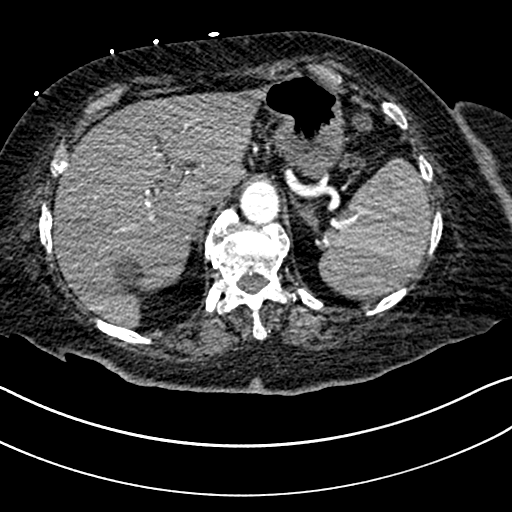
[im 76/304  lung]
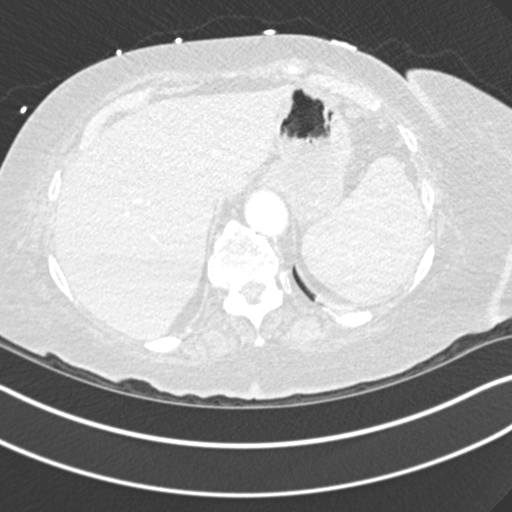
[im 91/304  mediastinal]
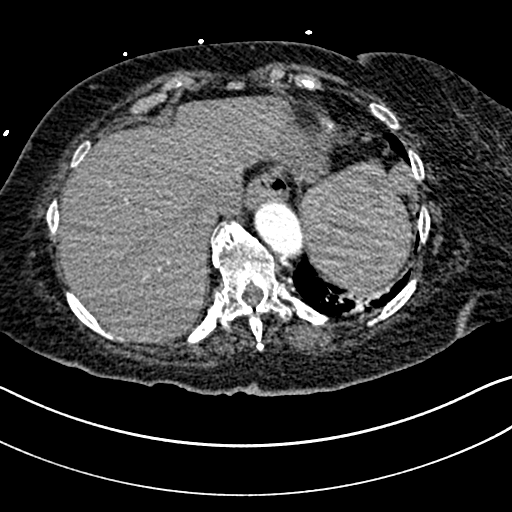
[im 107/304  lung]
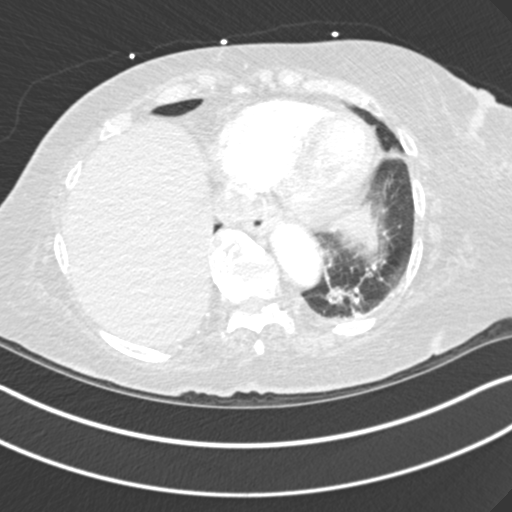
[im 122/304  mediastinal]
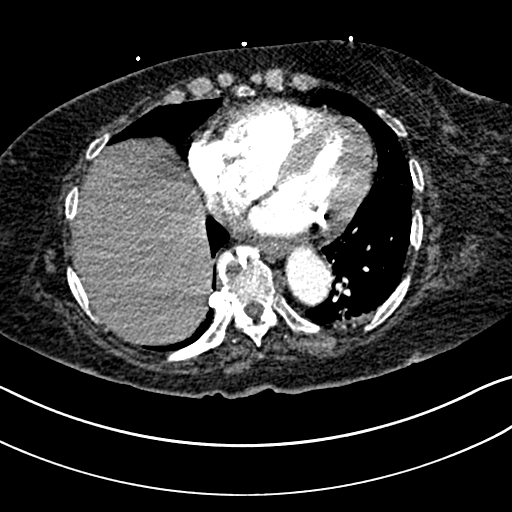
[im 137/304  lung]
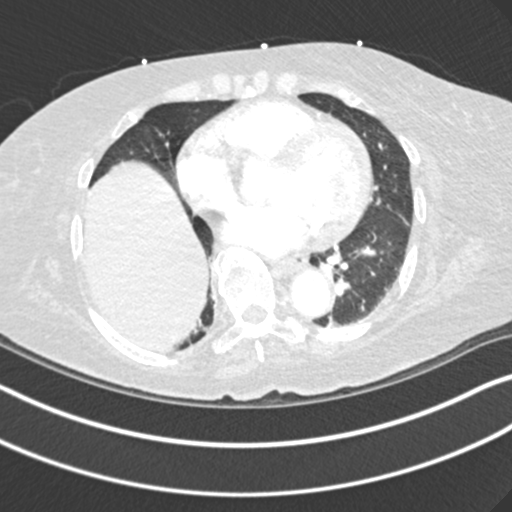
[im 152/304  mediastinal]
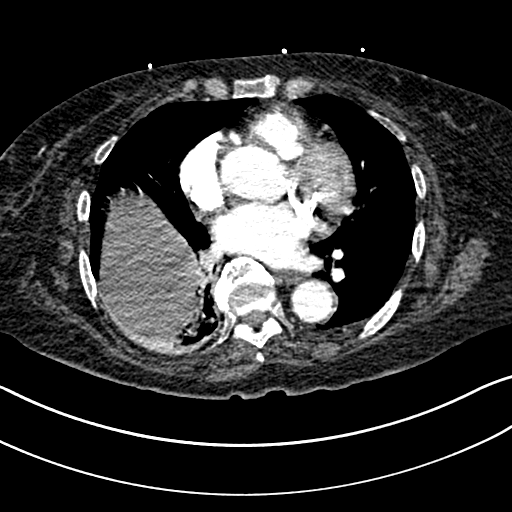
[im 167/304  lung]
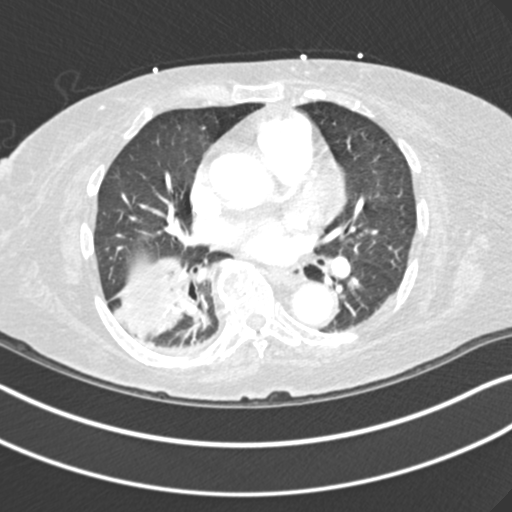
[im 182/304  mediastinal]
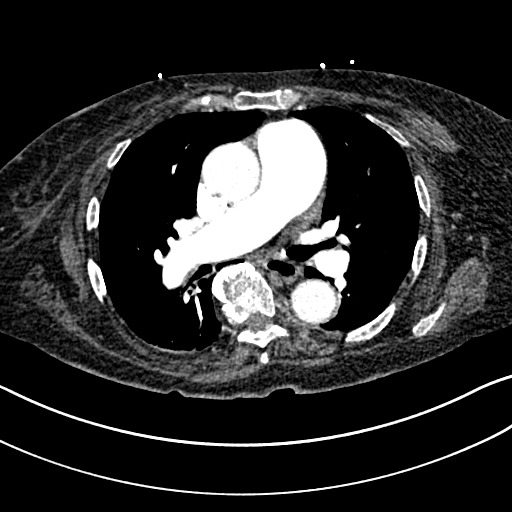
[im 197/304  lung]
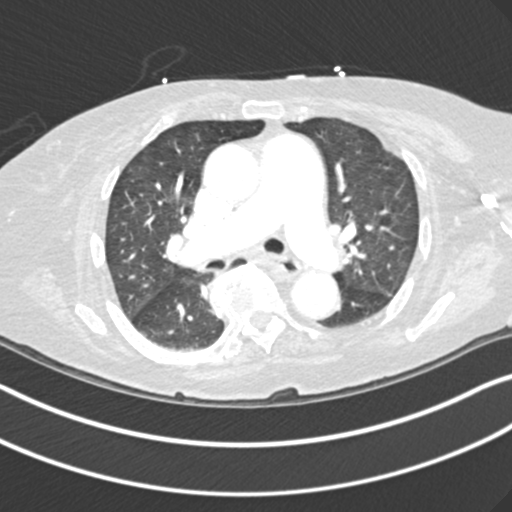
[im 213/304  mediastinal]
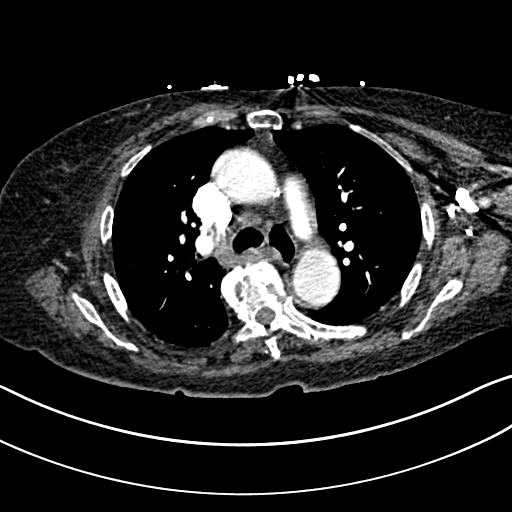
[im 228/304  lung]
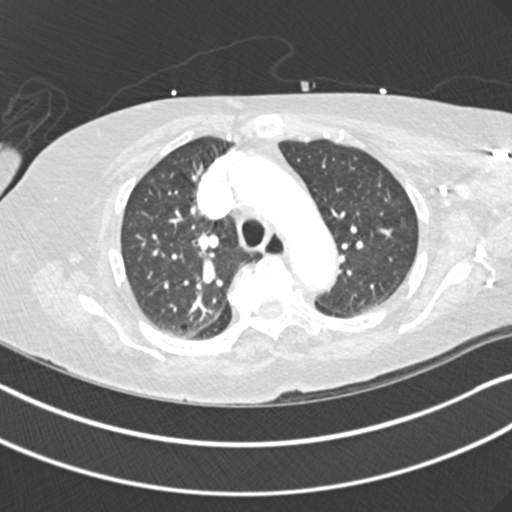
[im 243/304  mediastinal]
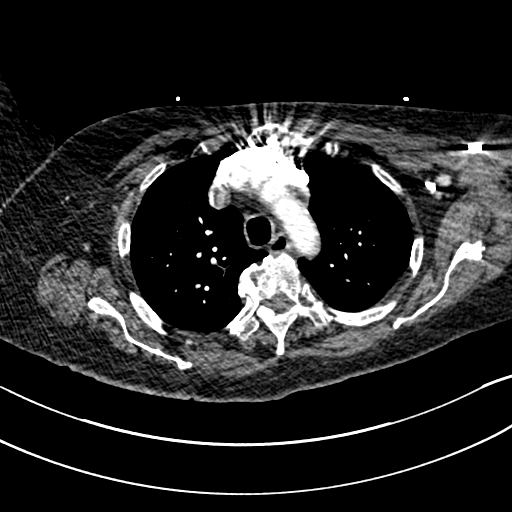
[im 258/304  lung]
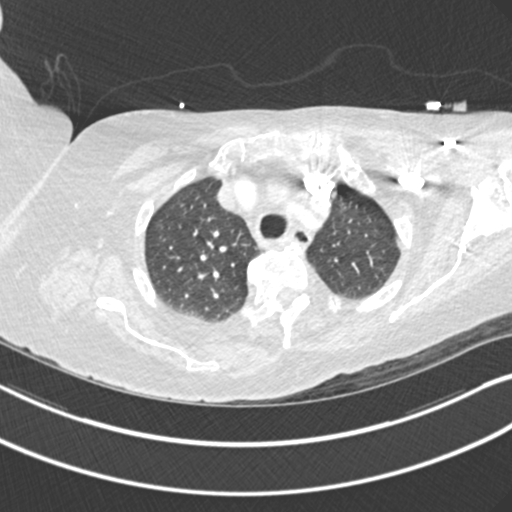
[im 273/304  mediastinal]
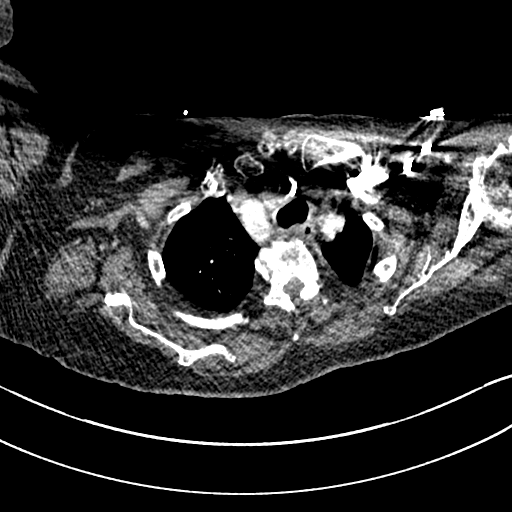
[im 288/304  lung]
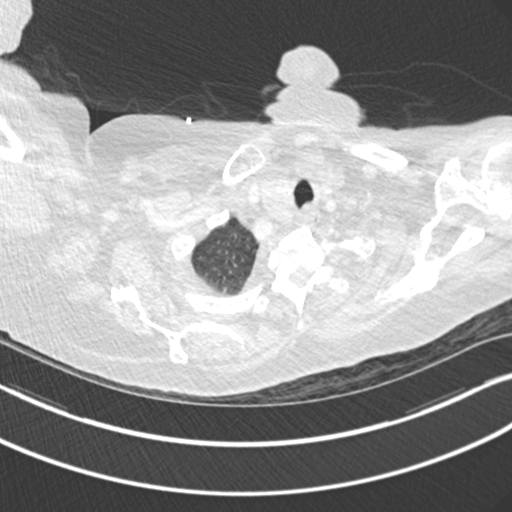

[19 of 36 positions shown; findings below may reference images not displayed]

FINDINGS: Cardiovascular: Satisfactory opacification of the pulmonary arteries
to the segmental level. No evidence of pulmonary embolism. Normal
heart size. No pericardial effusion. Normal caliber thoracic aorta.
No aortic dissection.

Mediastinum/Nodes: Esophagus is decompressed. No significant
lymphadenopathy in the chest. Bilateral thyroid nodules with
calcification, unchanged since previous study.

Lungs/Pleura: Motion artifact limits evaluation. There is focal
consolidation or atelectasis in the right lung base, similar to the
previous study. This would suggest a chronic or recurrent process.
Atelectasis or scarring in the left lung base. No airspace disease.
No pleural effusions. No pneumothorax. Airways are patent.

Upper Abdomen: Cholelithiasis without evidence of inflammatory
change in the gallbladder. Inferior vena caval filter. Multiple
right renal cysts. Small accessory spleen.

Musculoskeletal: Degenerative change and scoliosis of the thoracic
spine with convexity towards the right. No destructive bone lesions.

Review of the MIP images confirms the above findings.
IMPRESSION: No evidence of significant pulmonary embolus. Atelectasis or
consolidation in the right lung base is unchanged since prior study
suggesting chronic or recurrent process. Cholelithiasis.

## 2018-02-17 ENCOUNTER — Inpatient Hospital Stay: Payer: Medicare Other | Attending: Hematology and Oncology

## 2018-02-17 ENCOUNTER — Encounter: Payer: Self-pay | Admitting: Hematology and Oncology

## 2018-02-17 ENCOUNTER — Inpatient Hospital Stay (HOSPITAL_BASED_OUTPATIENT_CLINIC_OR_DEPARTMENT_OTHER): Payer: Medicare Other | Admitting: Hematology and Oncology

## 2018-02-17 VITALS — BP 158/90 | HR 69 | Temp 96.8°F | Resp 18

## 2018-02-17 DIAGNOSIS — Z7901 Long term (current) use of anticoagulants: Secondary | ICD-10-CM

## 2018-02-17 DIAGNOSIS — Z8673 Personal history of transient ischemic attack (TIA), and cerebral infarction without residual deficits: Secondary | ICD-10-CM

## 2018-02-17 DIAGNOSIS — I2782 Chronic pulmonary embolism: Secondary | ICD-10-CM

## 2018-02-17 DIAGNOSIS — D7581 Myelofibrosis: Secondary | ICD-10-CM | POA: Insufficient documentation

## 2018-02-17 DIAGNOSIS — I82412 Acute embolism and thrombosis of left femoral vein: Secondary | ICD-10-CM | POA: Diagnosis not present

## 2018-02-17 DIAGNOSIS — D471 Chronic myeloproliferative disease: Secondary | ICD-10-CM

## 2018-02-17 DIAGNOSIS — Z7189 Other specified counseling: Secondary | ICD-10-CM

## 2018-02-17 DIAGNOSIS — R600 Localized edema: Secondary | ICD-10-CM | POA: Insufficient documentation

## 2018-02-17 DIAGNOSIS — D45 Polycythemia vera: Secondary | ICD-10-CM | POA: Diagnosis present

## 2018-02-17 LAB — COMPREHENSIVE METABOLIC PANEL
ALT: 11 U/L — ABNORMAL LOW (ref 14–54)
AST: 26 U/L (ref 15–41)
Albumin: 3.2 g/dL — ABNORMAL LOW (ref 3.5–5.0)
Alkaline Phosphatase: 73 U/L (ref 38–126)
Anion gap: 6 (ref 5–15)
BUN: 15 mg/dL (ref 6–20)
CO2: 26 mmol/L (ref 22–32)
Calcium: 8.6 mg/dL — ABNORMAL LOW (ref 8.9–10.3)
Chloride: 104 mmol/L (ref 101–111)
Creatinine, Ser: 0.56 mg/dL (ref 0.44–1.00)
GFR calc Af Amer: >60 mL/min (ref >60)
GFR calc non Af Amer: >60 mL/min (ref >60)
Glucose, Bld: 134 mg/dL — ABNORMAL HIGH (ref 65–99)
Potassium: 4 mmol/L (ref 3.5–5.1)
Sodium: 136 mmol/L (ref 135–145)
Total Bilirubin: 0.5 mg/dL (ref 0.3–1.2)
Total Protein: 7.6 g/dL (ref 6.5–8.1)

## 2018-02-17 LAB — CBC WITH DIFFERENTIAL/PLATELET
Basophils Absolute: 0 10*3/uL (ref 0–0.1)
Basophils Relative: 1 %
Eosinophils Absolute: 0.1 10*3/uL (ref 0–0.7)
Eosinophils Relative: 3 %
HCT: 39.9 % (ref 35.0–47.0)
Hemoglobin: 12.9 g/dL (ref 12.0–16.0)
Lymphocytes Relative: 37 %
Lymphs Abs: 1 10*3/uL (ref 1.0–3.6)
MCH: 26.5 pg (ref 26.0–34.0)
MCHC: 32.3 g/dL (ref 32.0–36.0)
MCV: 82 fL (ref 80.0–100.0)
Monocytes Absolute: 0.1 10*3/uL — ABNORMAL LOW (ref 0.2–0.9)
Monocytes Relative: 4 %
Neutro Abs: 1.5 10*3/uL (ref 1.4–6.5)
Neutrophils Relative %: 55 %
Platelets: 582 10*3/uL — ABNORMAL HIGH (ref 150–440)
RBC: 4.87 MIL/uL (ref 3.80–5.20)
RDW: 35.6 % — ABNORMAL HIGH (ref 11.5–14.5)
WBC: 2.8 10*3/uL — ABNORMAL LOW (ref 3.6–11.0)

## 2018-02-17 NOTE — Progress Notes (Signed)
[facility] day:  02/17/2018   Chief Complaint: Yolanda Wells is a 84 y.o. female with polycythemia rubra vera (PV) who is seen for assessment after initiation of Jakafi.  HPI:   The patient was last seen in the medical oncology clinic on 01/29/2018.  At that time, she felt "fine". She was taking hydroxyurea 25 pills/week. She continued on Coumadin.  Exam was stable.  Hematocrit was 32.7  Platelet count was 1,245,000.  INR was 1.86.  Coumadin was increased to 4 mg on 6 days a week and 58m on Thursdays (total weekly dose of 29 mg)  Bone marrow revealed increased reticulin fibrosis.  There were no increased blasts.  Abnormal cytogenetics.  We discussed initiation of Jakafi.  She received Jakafi on 02/01/2018. Medication was started on 02/02/2018.  [facility] with bilateral lower extremity swelling.  Bilateral lower extremity duplex revealed nonocclusive thrombus within the left common femoral vein and superficial femoral vein.  There was no evidence of DVT on the right.  CBC revealed a hematocrit of 38.5, hemoglobin 12.6, MCV 82.6, platelets 1,038,000, WBC 3100 with an ANC of 1400.  INR was 1.51.  Coumadin was discontinued.  She was started on Eliquis.  During the interim, patient is doing well. She denies any acute symptoms. Patient continues to take her Hydrea 500 mg x 4 pills a day. Patient's physical therapy is on hold because of the LEFT lower extremity edema. Patient denies any B symptoms or interval infections.  Patient is eating well. She denies any recent weight loss. Patient denies pain in the clinic today.    Past Medical History:  Diagnosis Date  . Acute deep vein thrombosis (DVT) of distal vein of left lower extremity ([facility] 10/03/2015  . Acute pulmonary embolism ([facility] 10/03/2015  . Anticoagulant long-term use   . Arthritis   . CHF (congestive heart failure) ([facility]   . Chronic anticoagulation 10/03/2015  .  Collagen vascular disease ([facility]   . Colostomy in place ([facility] 10/04/2015  . Deep venous thrombosis (H[facility])    right lower extremity  . Dependent edema   . Diverticulosis of intestine with bleeding 04/02/2015  . Glenohumeral arthritis 06/28/2012  . Hammertoe 09/02/2012  . History of bilateral hip replacements 09/02/2012  . History of hysterectomy   . Hypertension   . Hypokalemia   . Inability to ambulate due to hip 10/12/2015  . Mandibular dysfunction   . Onychomycosis 09/02/2012  . Osteoarthritis   . Polycythemia vera ([facility]   . Presence of IVC filter 05/25/2015  . Stroke ([facility] 03/26/2015   cerebellar  . Urinary incontinence in female 09/02/2016    Past Surgical History:  Procedure Laterality Date  . ABDOMINAL HYSTERECTOMY    . BLEPHAROPLASTY Right 03/2017   right upper eyelid  . CARDIAC CATHETERIZATION Right 04/03/2015   Procedure: CENTRAL LINE INSERTION;  Surgeon: MSherri Rad MD;  Location: [facility] ORS;  Service: General;  Laterality: Right;  . COLECTOMY WITH COLOSTOMY CREATION/HARTMANN PROCEDURE N/A 04/03/2015   Procedure: COLECTOMY WITH COLOSTOMY CREATION/HARTMANN PROCEDURE;  Surgeon: MSherri Rad MD;  Location: [facility] ORS;  Service: General;  Laterality: N/A;  . COLON SURGERY    . FOOT AMPUTATION     partial  . FOOT AMPUTATION Right   . JOINT REPLACEMENT     Left and Right Hip  . PERIPHERAL VASCULAR CATHETERIZATION N/A 04/02/2015   Procedure: Visceral Angiography;  Surgeon: JAlgernon Huxley MD;  [facility];  Service: Cardiovascular;  Laterality: N/A;  . PERIPHERAL VASCULAR CATHETERIZATION N/A 04/02/2015   Procedure: Visceral Artery Intervention;  Surgeon: Algernon Huxley, MD;  Location: [facility];  Service: Cardiovascular;  Laterality: N/A;  . PERIPHERAL VASCULAR CATHETERIZATION N/A 05/25/2015   Procedure: IVC Filter Insertion;  Surgeon: Katha Cabal, MD;  Location: [facility];  Service: Cardiovascular;  Laterality: N/A;  . TOE AMPUTATION      right  . TOTAL HIP ARTHROPLASTY      Family History  Problem Relation Age of Onset  . Brain cancer Mother   . Heart attack Father   . COPD Father   . Coronary artery disease Father   . Hypertension Unknown   . Arthritis-Osteo Unknown   . Diabetes Brother   . Arthritis Brother   . Osteosarcoma Son   . Bone cancer Son   . Arthritis Sister   . Arthritis Brother   . Hypertension Brother   . Coronary artery disease Brother     Social History:  reports that she has never smoked. She has never used smokeless tobacco. She reports that she does not drink alcohol or use drugs.  She lives alone on [address] at [facility] (independent living).  She moved out of [facility].  She has been living with her grand daughter since 02/06/2017.  Her grand-daughter visits regularly [name] [phone]).  The patient is accompanied by her grand daughter, [name], today.   Allergies:  Allergies  Allergen Reactions  . Sulfamethoxazole-Trimethoprim Nausea And Vomiting    Current Medications: Current Outpatient Medications  Medication Sig Dispense Refill  . amLODipine (NORVASC) 5 MG tablet Take 5 mg by mouth daily.    [name] aspirin EC 81 MG tablet Take 1 tablet (81 mg total) by mouth daily. 30 tablet 2  . docusate sodium (COLACE) 100 MG capsule Take 100 mg by mouth 2 (two) times daily.     [name] ELIQUIS STARTER PACK (ELIQUIS STARTER PACK) 5 MG TABS Take as directed on package: start with two-63m tablets twice daily for 7 days. On day 8, switch to one-525mtablet twice daily. 1 each 0  . ENSURE (ENSURE) Take 1 Can by mouth 3 (three) times daily with meals.    . folic acid (FOLVITE) 40998CG tablet Take 400 mcg by mouth daily.    . furosemide (LASIX) 20 MG tablet Take 20 mg by mouth daily.    . hydroxyurea (HYDREA) 500 MG capsule TAKE 4 CAPSULES BY MOUTH DAILY 120 capsule 0  . ipratropium-albuterol (DUONEB) 0.5-2.5 (3) MG/3ML SOLN Take 3 mLs by nebulization every 4 (four) hours as needed.    .  polyethylene glycol (MIRALAX / GLYCOLAX) packet Take 17 g by mouth daily.    . potassium chloride (K-DUR,KLOR-CON) 10 MEQ tablet Take 10 mEq by mouth as needed. TAKE WITH LASIX PRN    . ruxolitinib phosphate (JAKAFI) 10 MG tablet Take 1 tablet (10 mg total) by mouth 2 (two) times daily. 60 tablet 0   No current facility-administered medications for this visit.     Review of Systems:  GENERAL:  Feels "ok".  No fevers, sweats.  No new weight. PERFORMANCE STATUS (ECOG):  3 HEENT:  Decreased vision in right eye s/p embolic events.  No visual changes, runny nose, sore throat, mouth sores or tenderness. Lungs: No shortness of breath or cough.  No hemoptysis. Cardiac:  No chest pain, palpitations, orthopnea, or PND. GI:  No nausea, vomiting, diarrhea, constipation, melena or  hematochezia. GU:  No urgency, frequency, dysuria, or hematuria. Incontinent.  Wears adult diapers. Musculoskeletal:  No back pain.  Knee problems ("bone on bone").  No muscle tenderness. Extremities: Chronic swelling in legs.  Left leg edema slightly increased with recent clot. Skin:  No rashes or skin changes. Neuro:  No headache, numbness or weakness, balance or coordination issues. Endocrine:  No diabetes, thyroid issues, hot flashes or night sweats. Psych:  No mood changes, depression or anxiety. Pain:  No focal pain. Review of systems:  All other systems reviewed and found to be negative.   Physical Exam: Blood pressure (!) 158/90, pulse 69, temperature (!) 96.8 F (36 C), temperature source Tympanic, resp. rate 18, SpO2 97 %. GENERAL:  Elderly woman sitting comfortably in a wheelchair in the exam room in no acute distress. MENTAL STATUS:  Alert and oriented to person, place and time. HEAD:  Pearline Cables hair.  Normocephalic, atraumatic, face symmetric, no Cushingoid features. EYES:  Brown eyes.  Pupils equal round and reactive to light and accomodation.  No conjunctivitis or scleral icterus. ENT:  Oropharynx clear without  lesion.  Tongue normal.  Upper dentures.  Mucous membranes moist.  RESPIRATORY:  Clear to auscultation without rales, wheezes or rhonchi. CARDIOVASCULAR:  Regular rate and rhythm without murmur, rub or gallop. ABDOMEN:  Left sided ostomy.  Soft, non-tender, with active bowel sounds, and no hepatosplenomegaly.  No masses. SKIN:  No rashes, ulcers or lesions. EXTREMITIES: Chronic lower extremity edema (left > right).  No skin discoloration or tenderness.  No palpable cords. LYMPH NODES: No palpable cervical, supraclavicular, axillary or inguinal adenopathy  NEUROLOGICAL: Unremarkable. PSYCH:  Appropriate.    Appointment on 02/17/2018  Component Date Value Ref Range Status  . Sodium 02/17/2018 136  135 - 145 mmol/L Final  . Potassium 02/17/2018 4.0  3.5 - 5.1 mmol/L Final  . Chloride 02/17/2018 104  101 - 111 mmol/L Final  . CO2 02/17/2018 26  22 - 32 mmol/L Final  . Glucose, Bld 02/17/2018 134* 65 - 99 mg/dL Final  . BUN 02/17/2018 15  6 - 20 mg/dL Final  . Creatinine, Ser 02/17/2018 0.56  0.44 - 1.00 mg/dL Final  . Calcium 02/17/2018 8.6* 8.9 - 10.3 mg/dL Final  . Total Protein 02/17/2018 7.6  6.5 - 8.1 g/dL Final  . Albumin 02/17/2018 3.2* 3.5 - 5.0 g/dL Final  . AST 02/17/2018 26  15 - 41 U/L Final  . ALT 02/17/2018 11* 14 - 54 U/L Final  . Alkaline Phosphatase 02/17/2018 73  38 - 126 U/L Final  . Total Bilirubin 02/17/2018 0.5  0.3 - 1.2 mg/dL Final  . GFR calc non Af Amer 02/17/2018 >60  >60 mL/min Final  . GFR calc Af Amer 02/17/2018 >60  >60 mL/min Final   Comment: (NOTE) The eGFR has been calculated using the CKD EPI equation. This calculation has not been validated in all clinical situations. eGFR's persistently <60 mL/min signify possible Chronic Kidney Disease.   Georgiann Hahn gap 02/17/2018 6  5 - 15 Final   Performed at [facility], [address], [place], [place] [address]  . WBC 02/17/2018 2.8* 3.6 - 11.0 K/uL Final  . RBC 02/17/2018 4.87  3.80 - 5.20  MIL/uL Final  . Hemoglobin 02/17/2018 12.9  12.0 - 16.0 g/dL Final  . HCT 02/17/2018 39.9  35.0 - 47.0 % Final  . MCV 02/17/2018 82.0  80.0 - 100.0 fL Final  . MCH 02/17/2018 26.5  26.0 - 34.0 pg Final  .  MCHC 02/17/2018 32.3  32.0 - 36.0 g/dL Final  . RDW 02/17/2018 35.6* 11.5 - 14.5 % Final  . Platelets 02/17/2018 582* 150 - 440 K/uL Final  . Neutrophils Relative % 02/17/2018 55  % Final  . Neutro Abs 02/17/2018 1.5  1.4 - 6.5 K/uL Final  . Lymphocytes Relative 02/17/2018 37  % Final  . Lymphs Abs 02/17/2018 1.0  1.0 - 3.6 K/uL Final  . Monocytes Relative 02/17/2018 4  % Final  . Monocytes Absolute 02/17/2018 0.1* 0.2 - 0.9 K/uL Final  . Eosinophils Relative 02/17/2018 3  % Final  . Eosinophils Absolute 02/17/2018 0.1  0 - 0.7 K/uL Final  . Basophils Relative 02/17/2018 1  % Final  . Basophils Absolute 02/17/2018 0.0  0 - 0.1 K/uL Final   Performed at [facility], [address], [place], [place] [address]    Assessment:  Dean Wonder is a 84 y.o. female African-American woman with JAK2+ polycythemia rubra vera (PV) previously on a phlebotomy program and hydroxyurea. She received P32 in an attempt to manage her counts in 03/2015 and on 05/14/2017.  Bone marrow on 01/07/2018 revealed a hypercellular bone marrow with a myeloproliferative neoplasm.  There was mild to moderate reticulin fibrosis.  Flow cytometry revealed no increased blasts.  Cytogenetics revealed 23, XX, der(15)t(1;15)(q11;p11).  The der 15t(1;15) has been associated with polycythemia, AML, and MDS.  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 She was diagnosed with bilateral lower extremity DVTs on 05/18/2015 and bilateral pulmonary emboli on 05/24/2015. She underwent IVC filter placement on 05/25/2015.  She has been on a fluctuating dose of Coumadin secondary to unstable INR.  She is on a baby  aspirin.  She developed progressive erythrocytosis, thrombocytosis, and leukocytosis.  She underwent phlebotomy for a hematocrit of 55.4 on 09/23/2015.  Hematocrit decreased to 50.0, but has again increased after initiation of oral iron.  Platelet count has increased from 1.1 million to 1.4 million.  White count increased from 23,000 - 28,000 to 31,800.  She was admitted on 08/25/2016 with loss of vision in her right eye.  CBC on 08/25/2016 revealed a hematocrit of 40.0, hemoglobin 13.5, MCV 91, platelets 842,000, WBC 4900.  INR was 2.59.  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  MRI and MRA showed no acute changes.  She has been back on hydroxyurea since 10/02/2015.  Initial dose was 1000 mg a day.  She is currently taking 4 pills/day (total weekly dose: 28 pills).   She began [place] on 02/02/2018.  She requires periodic phlebotomies (goal hematocrit <= 42).  Platelet count remains elevated (secondary to PV and likely some component of iron deficiency).  Goal platelet count is 400,000.  She was on Coumadin (discontinued secondary to clot and subtherapeutic INR).  She began Eliquis on 02/05/2018.  She was admitted to [facility] for 2.5 weeks with pneumonia then returned 07/03/2017 - 07/12/2017 with CHF.  She was admitted to [facility] from 09/26/2017 - 10/09/2017 with symptoms of heart failure and an E coli UTI.  She was treated with Lasix and ceftriaxone.  Echo revealed an EF of 60%.  CXR suggested pulmonary artery hypertension.    Symptomatically, she feels "fine". She is taking hydroxyurea 4 pills/day. She continues on apixaban.  Exam is stable.  Hematocrit is 39.9  Platelet count is 582,000 (previoulsy 1,245,000). WBC 2800 with an ANC of 1500.  Plan: 1.  Labs  today:  CBC with diff, CMP. 2.  Discuss recent ER visit and diagnosis of DVT with subtherapeutic INR.  Coumadin was switched to Eliquis. 3.  Discuss rapidly decreasing ANC. Will decrease to  Hydrea 500 mg/day.   4.  Continue apixaban as prescribed.  5.  Continue Jakafi as prescribed. Discussed need for weekly labs to monitor for neutropenia. Review neutropenic precautions. Anticipate eventual discontinuation of hydroxyurea and dose adjustment of Jakafi to reach goal counts (platelet count < 400,000, HCT < 42, ANC adequate). 6.  Send order to [facility] for weekly home health labs.  7.  RTC in 1 month for MD assessment and labs (CBC with diff, CMP).   Honor Loh, NP  02/17/2018, 12:01 PM   I saw and evaluated the patient, participating in the key portions of the service and reviewing pertinent diagnostic studies and records.  I reviewed the nurse practitioner's note and agree with the findings and the plan.  The assessment and plan were discussed with the patient.  Multiple questions were asked by the patient and answered.   Nolon Stalls, MD 02/17/2018, 12:01 PM

## 2018-02-17 NOTE — Progress Notes (Signed)
Patient here today for follow up regarding DVT.  She is now on Eliquis 5 mg once a day.

## 2018-02-19 ENCOUNTER — Other Ambulatory Visit: Payer: Medicare Other

## 2018-02-19 ENCOUNTER — Telehealth: Payer: Self-pay | Admitting: *Deleted

## 2018-02-19 ENCOUNTER — Ambulatory Visit: Payer: Medicare Other | Admitting: Hematology and Oncology

## 2018-02-19 NOTE — Telephone Encounter (Signed)
[facility] was unable to obtain labs today on patient.  Stated they will be draw them on Monday.  MD made aware.

## 2018-02-23 ENCOUNTER — Emergency Department: Payer: Medicare Other

## 2018-02-23 ENCOUNTER — Other Ambulatory Visit: Payer: Self-pay

## 2018-02-23 ENCOUNTER — Encounter: Payer: Self-pay | Admitting: *Deleted

## 2018-02-23 ENCOUNTER — Inpatient Hospital Stay
Admission: EM | Admit: 2018-02-23 | Discharge: 2018-02-26 | DRG: 291 | Disposition: A | Discharge status: Home / Self Care | Payer: Medicare Other | Attending: Specialist | Admitting: Specialist

## 2018-02-23 ENCOUNTER — Other Ambulatory Visit: Payer: Self-pay | Admitting: Hematology and Oncology

## 2018-02-23 DIAGNOSIS — Z7901 Long term (current) use of anticoagulants: Secondary | ICD-10-CM | POA: Diagnosis not present

## 2018-02-23 DIAGNOSIS — Z933 Colostomy status: Secondary | ICD-10-CM | POA: Diagnosis not present

## 2018-02-23 DIAGNOSIS — Z8261 Family history of arthritis: Secondary | ICD-10-CM

## 2018-02-23 DIAGNOSIS — Z8673 Personal history of transient ischemic attack (TIA), and cerebral infarction without residual deficits: Secondary | ICD-10-CM | POA: Diagnosis not present

## 2018-02-23 DIAGNOSIS — Z96643 Presence of artificial hip joint, bilateral: Secondary | ICD-10-CM | POA: Diagnosis present

## 2018-02-23 DIAGNOSIS — I110 Hypertensive heart disease with heart failure: Principal | ICD-10-CM | POA: Diagnosis present

## 2018-02-23 DIAGNOSIS — Z89431 Acquired absence of right foot: Secondary | ICD-10-CM

## 2018-02-23 DIAGNOSIS — Z881 Allergy status to other antibiotic agents status: Secondary | ICD-10-CM | POA: Diagnosis not present

## 2018-02-23 DIAGNOSIS — J9600 Acute respiratory failure, unspecified whether with hypoxia or hypercapnia: Secondary | ICD-10-CM | POA: Diagnosis present

## 2018-02-23 DIAGNOSIS — J9601 Acute respiratory failure with hypoxia: Secondary | ICD-10-CM | POA: Diagnosis present

## 2018-02-23 DIAGNOSIS — B9620 Unspecified Escherichia coli [E. coli] as the cause of diseases classified elsewhere: Secondary | ICD-10-CM | POA: Diagnosis present

## 2018-02-23 DIAGNOSIS — D45 Polycythemia vera: Secondary | ICD-10-CM | POA: Diagnosis not present

## 2018-02-23 DIAGNOSIS — M359 Systemic involvement of connective tissue, unspecified: Secondary | ICD-10-CM | POA: Diagnosis present

## 2018-02-23 DIAGNOSIS — Z9049 Acquired absence of other specified parts of digestive tract: Secondary | ICD-10-CM

## 2018-02-23 DIAGNOSIS — M19019 Primary osteoarthritis, unspecified shoulder: Secondary | ICD-10-CM | POA: Diagnosis present

## 2018-02-23 DIAGNOSIS — I5033 Acute on chronic diastolic (congestive) heart failure: Secondary | ICD-10-CM | POA: Diagnosis present

## 2018-02-23 DIAGNOSIS — I82412 Acute embolism and thrombosis of left femoral vein: Secondary | ICD-10-CM | POA: Diagnosis not present

## 2018-02-23 DIAGNOSIS — Z90710 Acquired absence of both cervix and uterus: Secondary | ICD-10-CM | POA: Diagnosis not present

## 2018-02-23 DIAGNOSIS — Z87440 Personal history of urinary (tract) infections: Secondary | ICD-10-CM

## 2018-02-23 DIAGNOSIS — Z86711 Personal history of pulmonary embolism: Secondary | ICD-10-CM

## 2018-02-23 DIAGNOSIS — Z880 Allergy status to penicillin: Secondary | ICD-10-CM

## 2018-02-23 DIAGNOSIS — Z86718 Personal history of other venous thrombosis and embolism: Secondary | ICD-10-CM | POA: Diagnosis not present

## 2018-02-23 DIAGNOSIS — Z808 Family history of malignant neoplasm of other organs or systems: Secondary | ICD-10-CM | POA: Diagnosis not present

## 2018-02-23 DIAGNOSIS — K5790 Diverticulosis of intestine, part unspecified, without perforation or abscess without bleeding: Secondary | ICD-10-CM | POA: Diagnosis present

## 2018-02-23 DIAGNOSIS — R0902 Hypoxemia: Secondary | ICD-10-CM

## 2018-02-23 DIAGNOSIS — H5461 Unqualified visual loss, right eye, normal vision left eye: Secondary | ICD-10-CM | POA: Diagnosis present

## 2018-02-23 DIAGNOSIS — Z8249 Family history of ischemic heart disease and other diseases of the circulatory system: Secondary | ICD-10-CM

## 2018-02-23 DIAGNOSIS — R109 Unspecified abdominal pain: Secondary | ICD-10-CM

## 2018-02-23 DIAGNOSIS — Z1624 Resistance to multiple antibiotics: Secondary | ICD-10-CM | POA: Diagnosis present

## 2018-02-23 DIAGNOSIS — N390 Urinary tract infection, site not specified: Secondary | ICD-10-CM | POA: Diagnosis not present

## 2018-02-23 LAB — URINALYSIS, COMPLETE (UACMP) WITH MICROSCOPIC
BILIRUBIN URINE: NEGATIVE
Glucose, UA: NEGATIVE mg/dL
Ketones, ur: NEGATIVE mg/dL
Nitrite: POSITIVE — AB
PH: 5 (ref 5.0–8.0)
Protein, ur: 30 mg/dL — AB
SPECIFIC GRAVITY, URINE: 1.017 (ref 1.005–1.030)

## 2018-02-23 LAB — COMPREHENSIVE METABOLIC PANEL
ALT: 11 U/L — ABNORMAL LOW (ref 14–54)
ANION GAP: 5 (ref 5–15)
AST: 29 U/L (ref 15–41)
Albumin: 3 g/dL — ABNORMAL LOW (ref 3.5–5.0)
Alkaline Phosphatase: 73 U/L (ref 38–126)
BILIRUBIN TOTAL: 0.6 mg/dL (ref 0.3–1.2)
BUN: 13 mg/dL (ref 6–20)
CO2: 29 mmol/L (ref 22–32)
Calcium: 8.7 mg/dL — ABNORMAL LOW (ref 8.9–10.3)
Chloride: 104 mmol/L (ref 101–111)
Creatinine, Ser: 0.55 mg/dL (ref 0.44–1.00)
GFR calc Af Amer: >60 mL/min (ref >60)
GFR calc non Af Amer: >60 mL/min (ref >60)
Glucose, Bld: 112 mg/dL — ABNORMAL HIGH (ref 65–99)
POTASSIUM: 3.7 mmol/L (ref 3.5–5.1)
Sodium: 138 mmol/L (ref 135–145)
TOTAL PROTEIN: 7.2 g/dL (ref 6.5–8.1)

## 2018-02-23 LAB — PROTIME-INR
INR: 1.72
PROTHROMBIN TIME: 20 s — AB (ref 11.4–15.2)

## 2018-02-23 LAB — CBC
HEMATOCRIT: 38.7 % (ref 35.0–47.0)
Hemoglobin: 12.3 g/dL (ref 12.0–16.0)
MCH: 26.2 pg (ref 26.0–34.0)
MCHC: 31.7 g/dL — ABNORMAL LOW (ref 32.0–36.0)
MCV: 82.7 fL (ref 80.0–100.0)
Platelets: 900 10*3/uL — ABNORMAL HIGH (ref 150–440)
RBC: 4.68 MIL/uL (ref 3.80–5.20)
RDW: 34.7 % — AB (ref 11.5–14.5)
WBC: 19.1 10*3/uL — AB (ref 3.6–11.0)

## 2018-02-23 LAB — BRAIN NATRIURETIC PEPTIDE: B Natriuretic Peptide: 84 pg/mL (ref 0.0–100.0)

## 2018-02-23 LAB — TROPONIN I: Troponin I: 0.03 ng/mL (ref <0.03)

## 2018-02-23 LAB — LIPASE, BLOOD: Lipase: 18 U/L (ref 11–51)

## 2018-02-23 MED ORDER — IOPAMIDOL (ISOVUE-370) INJECTION 76%
100.0000 mL | Freq: Once | INTRAVENOUS | Status: AC | PRN
  Administered 2018-02-23: 100 mL via INTRAVENOUS

## 2018-02-23 MED ORDER — FUROSEMIDE 10 MG/ML IJ SOLN
20.0000 mg | Freq: Once | INTRAMUSCULAR | Status: AC
Start: 2018-02-23 — End: 2018-02-24
  Administered 2018-02-24: 20 mg via INTRAVENOUS
  Filled 2018-02-23: qty 4

## 2018-02-23 MED ORDER — FUROSEMIDE 10 MG/ML IJ SOLN
40.0000 mg | Freq: Two times a day (BID) | INTRAMUSCULAR | Status: DC
  Administered 2018-02-24 – 2018-02-25 (×3): 40 mg via INTRAVENOUS
  Filled 2018-02-23 (×3): qty 4

## 2018-02-23 MED ORDER — SODIUM CHLORIDE 0.9 % IV SOLN
1.0000 g | Freq: Three times a day (TID) | INTRAVENOUS | Status: DC
  Administered 2018-02-24 – 2018-02-26 (×8): 1 g via INTRAVENOUS
  Filled 2018-02-23 (×9): qty 1

## 2018-02-23 NOTE — ED Notes (Signed)
I&O cath using sterile technique with Wilfred Lacy NT as second staff member at the bedside.  Prior to I&O cath pt was changed into clean depend as her depend was soiled with urine.  Pt tolerated this well

## 2018-02-23 NOTE — H&P (Signed)
Yolanda Wells at [place] NAME: Yolanda Wells    MR#:  [id number]  DATE OF BIRTH:  03-12-1932  DATE OF ADMISSION:  02/23/2018  PRIMARY CARE PHYSICIAN: [name], MD   REQUESTING/REFERRING PHYSICIAN:   CHIEF COMPLAINT:   Chief Complaint  Patient presents with  . Abdominal Pain    HISTORY OF PRESENT ILLNESS: Yolanda Wells  is a 84 y.o. female with a known history of PE, DVT, polycythemia vera, colostomy status post bowel obstruction, CHF, hypertension, osteoarthritis and other medical comorbidities. Patient presented to emergency room for lower abdominal discomfort and for colostomy not draining much stool in the past 24 hours.  The abdominal pain resolved by the time she arrived to emergency room. Patient denies any fever or chills no nausea, vomiting.  She also complains of worsening shortness of breath and worsening lower extremities edema going on for the past 3-4 days.  Her symptoms improve at rest.  Patient states she is compliant with her medications. Blood test done emergency room are notable for WBC at 19.1.  Troponin level is 0.03.  UA is positive for UTI. CAT scan of the abdomen and chest, reviewed by myself, shows  No evidence acute pulmonary embolism. Dilatation of pulmonary artery suggests pulmonary hypertension. Mild ground-glass opacities suggests mild pulmonary edema.  Peristomal hernia containing nonobstructed small bowel.  No evidence of bowel obstruction or inflammation.  Pt is admitted for further evaluation and treatment.   PAST MEDICAL HISTORY:   Past Medical History:  Diagnosis Date  . Acute deep vein thrombosis (DVT) of distal vein of left lower extremity ([place]) 10/03/2015  . Acute pulmonary embolism ([place]) 10/03/2015  . Anticoagulant long-term use   . Arthritis   . CHF (congestive heart failure) ([place])   . Chronic anticoagulation 10/03/2015  . Collagen vascular disease ([place])   . Colostomy in place [facility])  10/04/2015  . Deep venous thrombosis ([facility])    right lower extremity  . Dependent edema   . Diverticulosis of intestine with bleeding 04/02/2015  . Glenohumeral arthritis 06/28/2012  . Hammertoe 09/02/2012  . History of bilateral hip replacements 09/02/2012  . History of hysterectomy   . Hypertension   . Hypokalemia   . Inability to ambulate due to hip 10/12/2015  . Mandibular dysfunction   . Onychomycosis 09/02/2012  . Osteoarthritis   . Polycythemia vera ([place])   . Presence of IVC filter 05/25/2015  . Stroke ([place]) 03/26/2015   cerebellar  . Urinary incontinence in female 09/02/2016    PAST SURGICAL HISTORY:  Past Surgical History:  Procedure Laterality Date  . ABDOMINAL HYSTERECTOMY    . BLEPHAROPLASTY Right 03/2017   right upper eyelid  . CARDIAC CATHETERIZATION Right 04/03/2015   Procedure: CENTRAL LINE INSERTION;  Surgeon: Sherri Rad, MD;  Location: [facility] ORS;  Service: General;  Laterality: Right;  . COLECTOMY WITH COLOSTOMY CREATION/HARTMANN PROCEDURE N/A 04/03/2015   Procedure: COLECTOMY WITH COLOSTOMY CREATION/HARTMANN PROCEDURE;  Surgeon: Sherri Rad, MD;  Location: [facility] ORS;  Service: General;  Laterality: N/A;  . COLON SURGERY    . FOOT AMPUTATION     partial  . FOOT AMPUTATION Right   . JOINT REPLACEMENT     Left and Right Hip  . PERIPHERAL VASCULAR CATHETERIZATION N/A 04/02/2015   Procedure: Visceral Angiography;  Surgeon: Algernon Huxley, MD;  Location: [place] [facility];  Service: Cardiovascular;  Laterality: N/A;  . PERIPHERAL VASCULAR CATHETERIZATION N/A 04/02/2015   Procedure: Visceral Artery Intervention;  Surgeon: Corene Cornea  Bunnie Domino, MD;  Location: [place] [facility];  Service: Cardiovascular;  Laterality: N/A;  . PERIPHERAL VASCULAR CATHETERIZATION N/A 05/25/2015   Procedure: IVC Filter Insertion;  Surgeon: Katha Cabal, MD;  Location: [place] [facility];  Service: Cardiovascular;  Laterality: N/A;  . TOE AMPUTATION     right  . TOTAL HIP ARTHROPLASTY       SOCIAL HISTORY:  Social History   Tobacco Use  . Smoking status: Never Smoker  . Smokeless tobacco: Never Used  Substance Use Topics  . Alcohol use: No    FAMILY HISTORY:  Family History  Problem Relation Age of Onset  . Brain cancer Mother   . Heart attack Father   . COPD Father   . Coronary artery disease Father   . Hypertension Unknown   . Arthritis-Osteo Unknown   . Diabetes Brother   . Arthritis Brother   . Osteosarcoma Son   . Bone cancer Son   . Arthritis Sister   . Arthritis Brother   . Hypertension Brother   . Coronary artery disease Brother     DRUG ALLERGIES:  Allergies  Allergen Reactions  . Sulfamethoxazole-Trimethoprim Nausea And Vomiting    REVIEW OF SYSTEMS:   CONSTITUTIONAL: No fever, fatigue or weakness.  EYES: No blurred or double vision.  EARS, NOSE, AND THROAT: No tinnitus or ear pain.  RESPIRATORY: No cough, but positive for shortness of breath.  No wheezing or hemoptysis.  CARDIOVASCULAR: No chest pain, orthopnea.  Positive for worsening lower extremities edema GASTROINTESTINAL: No nausea, vomiting, diarrhea or abdominal pain.  GENITOURINARY: No dysuria, hematuria.  ENDOCRINE: No polyuria, nocturia,  HEMATOLOGY: Positive history of polycythemia vera.  No bleeding. SKIN: No rash or lesion. MUSCULOSKELETAL: No joint pain at this time.   NEUROLOGIC: No focal weakness.  PSYCHIATRY: No anxiety or depression.   MEDICATIONS AT HOME:  Prior to Admission medications   Medication Sig Start Date End Date Taking? Authorizing Provider  amLODipine (NORVASC) 5 MG tablet Take 5 mg by mouth daily.    [provider]  aspirin EC 81 MG tablet Take 1 tablet (81 mg total) by mouth daily. 09/16/15   [name], MD  docusate sodium (COLACE) 100 MG capsule Take 100 mg by mouth 2 (two) times daily.     [provider]  ELIQUIS STARTER PACK (ELIQUIS STARTER PACK) 5 MG TABS Take as directed on package: start with two-5mg  tablets  twice daily for 7 days. On day 8, switch to one-5mg  tablet twice daily. 02/05/18   Eula Listen, MD  ENSURE (ENSURE) Take 1 Can by mouth 3 (three) times daily with meals.    [provider]  folic acid (FOLVITE) 595 MCG tablet Take 400 mcg by mouth daily.    [provider]  furosemide (LASIX) 20 MG tablet Take 20 mg by mouth daily.    [provider]  hydroxyurea (HYDREA) 500 MG capsule TAKE 4 CAPSULES BY MOUTH DAILY 02/02/18   Nolon Stalls C, MD  ipratropium-albuterol (DUONEB) 0.5-2.5 (3) MG/3ML SOLN Take 3 mLs by nebulization every 4 (four) hours as needed.    [provider]  JAKAFI 10 MG tablet TAKE 1 TABLET (10 MG TOTAL) BY MOUTH 2 TIMES DAILY. 02/23/18   Karen Kitchens, NP  polyethylene glycol (MIRALAX / GLYCOLAX) packet Take 17 g by mouth daily.    [provider]  potassium chloride (K-DUR,KLOR-CON) 10 MEQ tablet Take 10 mEq by mouth as needed. TAKE WITH LASIX PRN  [provider]      PHYSICAL EXAMINATION:   VITAL SIGNS: Blood pressure 123/70, pulse 74, temperature 98.1 F (36.7 C), temperature source Oral, resp. rate (!) 21, height 5\' 7"  (1.702 m), weight 90.7 kg (200 lb), SpO2 99 %.  GENERAL:  84 y.o.-year-old patient lying in the bed with mild respiratory distress.  EYES: Pupils equal, round, reactive to light and accommodation. No scleral icterus.   HEENT: Head atraumatic, normocephalic. Oropharynx and nasopharynx clear.  NECK:  Supple, no jugular venous distention. No thyroid enlargement, no tenderness.  LUNGS: Reduced breath sounds bilaterally, no wheezing.  Bibasilar crackles are heard.  No use of accessory muscles of respiration.  CARDIOVASCULAR: S1, S2 normal. No S3/S4.  ABDOMEN: Soft, nontender, nondistended. Bowel sounds present. No guarding/rebound.  Ostomy bag in place, there is brown stool in the bag. EXTREMITIES: Bilateral 2+ pitting edema up to the knees. NEUROLOGIC: No focal weakness. PSYCHIATRIC:  The patient is alert and oriented x 3.  SKIN: No obvious rash, lesion, or ulcer.   LABORATORY PANEL:   CBC Recent Labs  Lab 02/17/18 1057 02/23/18 1856  WBC 2.8* 19.1*  HGB 12.9 12.3  HCT 39.9 38.7  PLT 582* 900*  MCV 82.0 82.7  MCH 26.5 26.2  MCHC 32.3 31.7*  RDW 35.6* 34.7*  LYMPHSABS 1.0  --   MONOABS 0.1*  --   EOSABS 0.1  --   BASOSABS 0.0  --    ------------------------------------------------------------------------------------------------------------------  Chemistries  Recent Labs  Lab 02/17/18 1057 02/23/18 1856  NA 136 138  K 4.0 3.7  CL 104 104  CO2 26 29  GLUCOSE 134* 112*  BUN 15 13  CREATININE 0.56 0.55  CALCIUM 8.6* 8.7*  AST 26 29  ALT 11* 11*  ALKPHOS 73 73  BILITOT 0.5 0.6   ------------------------------------------------------------------------------------------------------------------ estimated creatinine clearance is 61.6 mL/min (by C-G formula based on SCr of 0.55 mg/dL). ------------------------------------------------------------------------------------------------------------------ No results for input(s): TSH, T4TOTAL, T3FREE, THYROIDAB in the last 72 hours.  Invalid input(s): FREET3   Coagulation profile Recent Labs  Lab 02/23/18 1857  INR 1.72   ------------------------------------------------------------------------------------------------------------------- No results for input(s): DDIMER in the last 72 hours. -------------------------------------------------------------------------------------------------------------------  Cardiac Enzymes Recent Labs  Lab 02/23/18 1857  TROPONINI 0.03*   ------------------------------------------------------------------------------------------------------------------ Invalid input(s): POCBNP  ---------------------------------------------------------------------------------------------------------------  Urinalysis    Component Value Date/Time   COLORURINE YELLOW (A)  02/23/2018 1856   APPEARANCEUR HAZY (A) 02/23/2018 1856   APPEARANCEUR Hazy 07/08/2014 0956   LABSPEC 1.017 02/23/2018 1856   LABSPEC 1.019 07/08/2014 0956   PHURINE 5.0 02/23/2018 1856   GLUCOSEU NEGATIVE 02/23/2018 1856   GLUCOSEU Negative 07/08/2014 0956   HGBUR SMALL (A) 02/23/2018 1856   BILIRUBINUR NEGATIVE 02/23/2018 1856   BILIRUBINUR Negative 07/08/2014 0956   KETONESUR NEGATIVE 02/23/2018 1856   PROTEINUR 30 (A) 02/23/2018 1856   NITRITE POSITIVE (A) 02/23/2018 1856   LEUKOCYTESUR SMALL (A) 02/23/2018 1856   LEUKOCYTESUR 1+ 07/08/2014 0956     RADIOLOGY: Dg Chest 2 View  Result Date: 02/23/2018 CLINICAL DATA:  Pain and shortness of breath EXAM: CHEST - 2 VIEW COMPARISON:  February 05, 2018 FINDINGS: There is no edema or consolidation. Heart is mildly enlarged with pulmonary vascularity within normal limits. There is aortic atherosclerosis. No adenopathy. There is degenerative change in each shoulder. IMPRESSION: No edema or consolidation. Mild cardiac enlargement. Aortic atherosclerosis. Aortic Atherosclerosis (ICD10-I70.0). Electronically Signed   By: Lowella Grip III M.D.   On: 02/23/2018 20:11   Ct Angio Chest Pe W And/or Wo  Contrast  Result Date: 02/23/2018 CLINICAL DATA:  DVT. Concern for pulmonary embolism. Long-term anticoagulation. Congestive heart failure. Polycythemia vera. Colostomy. Suprapubic pain. EXAM: CT ANGIOGRAPHY CHEST CT ABDOMEN AND PELVIS WITH CONTRAST TECHNIQUE: Multidetector CT imaging of the chest was performed using the standard protocol during bolus administration of intravenous contrast. Multiplanar CT image reconstructions and MIPs were obtained to evaluate the vascular anatomy. Multidetector CT imaging of the abdomen and pelvis was performed using the standard protocol during bolus administration of intravenous contrast. CONTRAST:  100 cc Isovue COMPARISON:  None. FINDINGS: CTA CHEST FINDINGS Cardiovascular: No filling defects within the pulmonary  arteries to suggest acute pulmonary embolism. No acute findings of the aorta or great vessels. No pericardial fluid. The pulmonary artery is dilated 45 mm. Calcification of the aortic arch. No pericardial effusion. Mediastinum/Nodes: No axillary supraclavicular adenopathy. No mediastinal hilar adenopathy. Lungs/Pleura: Bibasilar atelectasis. No pulmonary infarction or infiltrate. Mild ground-glass opacities Musculoskeletal: No aggressive osseous lesion. Degenerate spurring of the thoracic spine Review of the MIP images confirms the above findings. CT ABDOMEN and PELVIS FINDINGS Hepatobiliary: No focal hepatic lesion. Gallstones in a nondistended gallbladder. Pancreas: Pancreas is normal. No ductal dilatation. No pancreatic inflammation. Spleen: Normal spleen Adrenals/urinary tract: Adrenal glands normal. Bilateral nonenhancing renal cysts. No obstructing ureteral obstruction. Bladder normal Stomach/Bowel: Stomach, small-bowel cecum normal. Colostomy in the LEFT lower quadrant without complication. There is peristomal hernia which contains a nonobstructed loop of small bowel. The small bowel entering the peristomal hernia measures approximately 4 cm (image 50/11). Excluded descending colon and rectum appear normal Vascular/Lymphatic: Calcified abdominal aorta. No aneurysm. Infrarenal IVC filter noted. No retroperitoneal lymphadenopathy. No pelvic lymphadenopathy Reproductive: Post hysterectomy anatomy Other: No free fluid. Musculoskeletal: No aggressive osseous lesion. IMPRESSION: Chest Impression: 1. No evidence acute pulmonary embolism. 2. Dilatation of pulmonary artery suggests pulmonary hypertension. 3. Mild ground-glass opacities suggests mild pulmonary edema. Abdomen / Pelvis Impression: 1. Peristomal hernia containing nonobstructed small bowel. 2. No evidence of bowel obstruction or inflammation. 3.  Aortic Atherosclerosis (ICD10-I70.0). Electronically Signed   By: Suzy Bouchard M.D.   On: 02/23/2018 21:48    Ct Abdomen Pelvis W Contrast  Result Date: 02/23/2018 CLINICAL DATA:  DVT. Concern for pulmonary embolism. Long-term anticoagulation. Congestive heart failure. Polycythemia vera. Colostomy. Suprapubic pain. EXAM: CT ANGIOGRAPHY CHEST CT ABDOMEN AND PELVIS WITH CONTRAST TECHNIQUE: Multidetector CT imaging of the chest was performed using the standard protocol during bolus administration of intravenous contrast. Multiplanar CT image reconstructions and MIPs were obtained to evaluate the vascular anatomy. Multidetector CT imaging of the abdomen and pelvis was performed using the standard protocol during bolus administration of intravenous contrast. CONTRAST:  100 cc Isovue COMPARISON:  None. FINDINGS: CTA CHEST FINDINGS Cardiovascular: No filling defects within the pulmonary arteries to suggest acute pulmonary embolism. No acute findings of the aorta or great vessels. No pericardial fluid. The pulmonary artery is dilated 45 mm. Calcification of the aortic arch. No pericardial effusion. Mediastinum/Nodes: No axillary supraclavicular adenopathy. No mediastinal hilar adenopathy. Lungs/Pleura: Bibasilar atelectasis. No pulmonary infarction or infiltrate. Mild ground-glass opacities Musculoskeletal: No aggressive osseous lesion. Degenerate spurring of the thoracic spine Review of the MIP images confirms the above findings. CT ABDOMEN and PELVIS FINDINGS Hepatobiliary: No focal hepatic lesion. Gallstones in a nondistended gallbladder. Pancreas: Pancreas is normal. No ductal dilatation. No pancreatic inflammation. Spleen: Normal spleen Adrenals/urinary tract: Adrenal glands normal. Bilateral nonenhancing renal cysts. No obstructing ureteral obstruction. Bladder normal Stomach/Bowel: Stomach, small-bowel cecum normal. Colostomy in the LEFT lower quadrant without complication. There is  peristomal hernia which contains a nonobstructed loop of small bowel. The small bowel entering the peristomal hernia measures  approximately 4 cm (image 50/11). Excluded descending colon and rectum appear normal Vascular/Lymphatic: Calcified abdominal aorta. No aneurysm. Infrarenal IVC filter noted. No retroperitoneal lymphadenopathy. No pelvic lymphadenopathy Reproductive: Post hysterectomy anatomy Other: No free fluid. Musculoskeletal: No aggressive osseous lesion. IMPRESSION: Chest Impression: 1. No evidence acute pulmonary embolism. 2. Dilatation of pulmonary artery suggests pulmonary hypertension. 3. Mild ground-glass opacities suggests mild pulmonary edema. Abdomen / Pelvis Impression: 1. Peristomal hernia containing nonobstructed small bowel. 2. No evidence of bowel obstruction or inflammation. 3.  Aortic Atherosclerosis (ICD10-I70.0). Electronically Signed   By: Suzy Bouchard M.D.   On: 02/23/2018 21:48   Dg Abd 2 Views  Result Date: 02/23/2018 CLINICAL DATA:  Colostomy not draining.  Abdominal pain EXAM: ABDOMEN - 2 VIEW COMPARISON:  CT 01/08/2018 FINDINGS: No dilated large or small bowel. LEFT lower quadrant ostomy noted. IVC filter noted. Bilateral hip prosthetics IMPRESSION: No evidence of bowel obstruction. Electronically Signed   By: Suzy Bouchard M.D.   On: 02/23/2018 20:06    EKG: Orders placed or performed during the hospital encounter of 02/23/18  . ED EKG  . ED EKG    IMPRESSION AND PLAN:  1.  Acute pulmonary edema.  We will start patient on Lasix IV and oxygen therapy.  Continue to monitor clinically closely. Most recent 2D echo, done approximately 4 months ago showed normal LVEF and dialted RA/RV. 2.  Acute respiratory failure with hypoxia, secondary to acute pulmonary edema.  Patient's oxygen saturation was 86% of the arrival to emergency room.  Continue treatment as above under #1. 3.  Acute UTI.  Per chart review, patient seems to be resistant to several antibiotics.  We will start on meropenem IV while waiting for final culture result. 4.  Polycythemia vera, stable, continue treatment per  hematology service. 5.  Colostomy, status post partial bowel resection in the past.  Doing well, no obstruction per abdominal CAT scan.  Continue to monitor clinically closely.  All the records are reviewed and case discussed with ED provider. Management plans discussed with the patient, family and they are in agreement.  CODE STATUS: Code Status History    Date Active Date Inactive Code Status Order ID Comments User Context   10/15/2017 1141 10/16/2017 2204 Full Code [id number]  Knox Royalty, NP Inpatient   10/13/2017 2246 10/15/2017 1141 DNR [id number]  Lance Coon, MD ED   10/06/2017 1454 10/09/2017 2058 DNR [id number]  Nicholes Mango, MD Inpatient   10/06/2017 1111 10/06/2017 1453 Full Code [id number]  Nicholes Mango, MD Inpatient   05/27/2017 1954 05/28/2017 2013 Full Code [id number]  Vaughan Basta, MD Inpatient   05/23/2017 2322 05/26/2017 1614 Full Code [id number]  Lance Coon, MD Inpatient   04/10/2017 0325 04/11/2017 1916 Full Code [id number]  Harrie Foreman, MD Inpatient   11/23/2016 0629 12/01/2016 1935 Full Code [id number]  Harrie Foreman, MD Inpatient   08/26/2016 0445 08/26/2016 1923 Full Code [id number]  Saundra Shelling, MD Inpatient   09/22/2015 1755 09/25/2015 1844 Full Code [id number]  Demetrios Loll, MD ED   09/15/2015 0341 09/17/2015 1741 Full Code [id number]  Harrie Foreman, MD Inpatient   07/16/2015 0151 07/20/2015 2334 Full Code [id number]  Lance Coon, MD Inpatient   05/25/2015 0834 05/29/2015 1616 Full Code [id number]  Harrie Foreman, MD Inpatient   04/02/2015 0606 04/08/2015 1651 Full Code [id number]  Harrie Foreman, MD Inpatient  TOTAL TIME TAKING CARE OF THIS PATIENT: 45 minutes.    Amelia Jo M.D on 02/23/2018 at 11:24 PM  Between 7am to 6pm - Pager - [phone]  After 6pm go to [email or web address] - password [email or web address] [facility]  Office  [phone]  CC: Primary care physician; [name], MD

## 2018-02-23 NOTE — ED Triage Notes (Signed)
Pt is here from home where she has aides that help her.  Pt has a colostomy and states that it is not draining and that it has not been emptied all day.  Pt also reports abdominal pain in lower abdomen.  No fever or chills, no vomiting.  CBG 124 for ems. VSS.

## 2018-02-23 NOTE — ED Notes (Signed)
Pt granddaughter called and states that colostomy bag was changed yesterday but there had not been any further stool.  Pt has been feeling some nausea and vomited x1.  She states that pt has been sleeping more than usual and has had a lack of appetite.

## 2018-02-23 NOTE — ED Notes (Signed)
Patient transported to CT, IV in both IV  Checked by second RN and CT tech, drew blood and flushed well despite not being able to be fully advanced

## 2018-02-23 NOTE — ED Notes (Signed)
Granddaughter states that pt is no longer on wafarin, she was switched to elaquis.

## 2018-02-23 NOTE — ED Notes (Signed)
Date and time results received: 02/23/18 7:41 PM   Test: Troponin Critical Value: 0.03  Name of Provider Notified: Burlene Arnt

## 2018-02-23 NOTE — Progress Notes (Signed)
Pharmacy Antibiotic Note  Yolanda Wells is a 84 y.o. female admitted on 02/23/2018 with UTI.  Pharmacy has been consulted for meropenem dosing.  Plan: Meropenem 1 gram q 8 hours ordered. Hx pseudomonas UTI  Height: 5\' 7"  (170.2 cm) Weight: 200 lb (90.7 kg) IBW/kg (Calculated) : 61.6  Temp (24hrs), Avg:98.1 F (36.7 C), Min:98.1 F (36.7 C), Max:98.1 F (36.7 C)  Recent Labs  Lab 02/17/18 1057 02/23/18 1856  WBC 2.8* 19.1*  CREATININE 0.56 0.55    Estimated Creatinine Clearance: 61.6 mL/min (by C-G formula based on SCr of 0.55 mg/dL).    Allergies  Allergen Reactions  . Sulfamethoxazole-Trimethoprim Nausea And Vomiting    Antimicrobials this admission: meropenem 4/17 >>    >>   Dose adjustments this admission:   Microbiology results: 4/16 UCx: pending       4/16 UA: LE(+) NO2(+)  WBC TNTC Thank you for allowing pharmacy to be a part of this patient's care.  Americus Scheurich S 02/23/2018 11:01 PM

## 2018-02-23 NOTE — ED Provider Notes (Addendum)
[facility] Emergency Department Provider Note  ____________________________________________   I have reviewed the triage vital signs and the nursing notes. Where available I have reviewed prior notes and, if possible and indicated, outside hospital notes.    HISTORY  Chief Complaint Abdominal Pain    HPI Yolanda Wells is a 84 y.o. female history of DVT, PEs in the past, long-term anticoagulation use, CHF, colostomy, bowel obstruction, diverticulosis, polycythemia vera states that she was having some abdominal pain earlier although she is not having it now.  She thinks she might have for an appoint although she is not sure.  She states also there was some slightly less stool coming out of her ostomy bag.  Is not clear exactly how long that has been going on.  She does not have any pain right now.  She states when the pain was there was an ache in her lower abdomen she indicates the suprapubic region below her ostomy.  She denies any bleeding or fever.  She states she may have had a slight cough, she is a very poor historian.  Denies being significantly short of breath.  Level 5 chart caveat; no further history available due to patient status.  Past Medical History:  Diagnosis Date  . Acute deep vein thrombosis (DVT) of distal vein of left lower extremity ([place]) 10/03/2015  . Acute pulmonary embolism ([place]) 10/03/2015  . Anticoagulant long-term use   . Arthritis   . CHF (congestive heart failure) ([place])   . Chronic anticoagulation 10/03/2015  . Collagen vascular disease ([place])   . Colostomy in place [facility]) 10/04/2015  . Deep venous thrombosis (HCC)    right lower extremity  . Dependent edema   . Diverticulosis of intestine with bleeding 04/02/2015  . Glenohumeral arthritis 06/28/2012  . Hammertoe 09/02/2012  . History of bilateral hip replacements 09/02/2012  . History of hysterectomy   . Hypertension   . Hypokalemia   . Inability to ambulate due to hip  10/12/2015  . Mandibular dysfunction   . Onychomycosis 09/02/2012  . Osteoarthritis   . Polycythemia vera ([place])   . Presence of IVC filter 05/25/2015  . 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 Urinary incontinence in female 09/02/2016    Patient Active Problem List   Diagnosis Date Noted  . Acute deep vein thrombosis (DVT) of left femoral vein ([place]) 02/17/2018  . Secondary myelofibrosis ([place]) 02/17/2018  . Goals of care, counseling/discussion 12/23/2017  . Palliative care by specialist   . DNR (do not resuscitate) discussion   . Weakness generalized   . Hypoxia 10/13/2017  . Acute CHF (congestive heart failure) ([place]) 10/06/2017  . Chronic diastolic CHF (congestive heart failure) ([place]) 07/28/2017  . Dyspnea 05/27/2017  . Ileus ([place]) 04/10/2017  . Influenza with respiratory manifestation 12/01/2016  . Respiratory distress 11/23/2016  . Urinary incontinence in female 09/02/2016  . Vision loss of right eye 08/26/2016  . Blindness of right eye 08/26/2016  . Inability to ambulate due to hip 10/12/2015  . Colostomy in place [facility]) 10/04/2015  . Long term current use of anticoagulant therapy 10/03/2015  . Acute pulmonary embolism ([place]) 10/03/2015  . Leukocytosis 09/25/2015  . Chest pain 09/25/2015  . COPD exacerbation ([place]) 09/24/2015  . CAP (community acquired pneumonia) 09/24/2015  . COPD (chronic obstructive pulmonary disease) ([place]) 09/24/2015  . SOB (shortness of breath) 09/22/2015  . Elevated troponin 09/15/2015  . UTI (urinary tract infection) 07/16/2015  . Abdominal wall cellulitis 07/15/2015  . DVT (deep venous  thrombosis) ([place]) 07/15/2015  . HTN (hypertension) 07/15/2015  . Polycythemia vera ([place]) 07/15/2015  . Dependent edema 07/15/2015  . Arthritis 07/15/2015  . Pulmonary emboli ([place]) 05/25/2015  . Diverticulosis of colon with hemorrhage 04/02/2015  . Diverticulosis of intestine with bleeding 04/02/2015  . Dehydration, moderate 01/11/2015  . Fall 01/10/2015  .  Osteoarthritis 01/10/2015  . Hammertoe 09/02/2012  . S/P transmetatarsal amputation of foot ([place]) 09/02/2012  . Onychomycosis 09/02/2012  . Other specified dermatoses 09/02/2012  . Hip pain 08/23/2012  . S/P hip replacement 08/23/2012  . Glenohumeral arthritis 06/28/2012    Past Surgical History:  Procedure Laterality Date  . ABDOMINAL HYSTERECTOMY    . BLEPHAROPLASTY Right 03/2017   right upper eyelid  . CARDIAC CATHETERIZATION Right 04/03/2015   Procedure: CENTRAL LINE INSERTION;  Surgeon: Sherri Rad, MD;  Location: [facility] ORS;  Service: General;  Laterality: Right;  . COLECTOMY WITH COLOSTOMY CREATION/HARTMANN PROCEDURE N/A 04/03/2015   Procedure: COLECTOMY WITH COLOSTOMY CREATION/HARTMANN PROCEDURE;  Surgeon: Sherri Rad, MD;  Location: [facility] ORS;  Service: General;  Laterality: N/A;  . COLON SURGERY    . FOOT AMPUTATION     partial  . FOOT AMPUTATION Right   . JOINT REPLACEMENT     Left and Right Hip  . PERIPHERAL VASCULAR CATHETERIZATION N/A 04/02/2015   Procedure: Visceral Angiography;  Surgeon: Algernon Huxley, MD;  Location: [place] CV LAB;  Service: Cardiovascular;  Laterality: N/A;  . PERIPHERAL VASCULAR CATHETERIZATION N/A 04/02/2015   Procedure: Visceral Artery Intervention;  Surgeon: Algernon Huxley, MD;  Location: [place] CV LAB;  Service: Cardiovascular;  Laterality: N/A;  . PERIPHERAL VASCULAR CATHETERIZATION N/A 05/25/2015   Procedure: IVC Filter Insertion;  Surgeon: Katha Cabal, MD;  Location: [place] CV LAB;  Service: Cardiovascular;  Laterality: N/A;  . TOE AMPUTATION     right  . TOTAL HIP ARTHROPLASTY      Prior to Admission medications   Medication Sig Start Date End Date Taking? Authorizing Provider  amLODipine (NORVASC) 5 MG tablet Take 5 mg by mouth daily.    [provider]  aspirin EC 81 MG tablet Take 1 tablet (81 mg total) by mouth daily. 09/16/15   [name], MD  docusate sodium (COLACE) 100 MG capsule Take 100 mg by mouth  2 (two) times daily.     [provider]  ELIQUIS STARTER PACK (ELIQUIS STARTER PACK) 5 MG TABS Take as directed on package: start with two-5mg  tablets twice daily for 7 days. On day 8, switch to one-5mg  tablet twice daily. 02/05/18   Eula Listen, MD  ENSURE (ENSURE) Take 1 Can by mouth 3 (three) times daily with meals.    [provider]  folic acid (FOLVITE) 626 MCG tablet Take 400 mcg by mouth daily.    [provider]  furosemide (LASIX) 20 MG tablet Take 20 mg by mouth daily.    [provider]  hydroxyurea (HYDREA) 500 MG capsule TAKE 4 CAPSULES BY MOUTH DAILY 02/02/18   Nolon Stalls C, MD  ipratropium-albuterol (DUONEB) 0.5-2.5 (3) MG/3ML SOLN Take 3 mLs by nebulization every 4 (four) hours as needed.    [provider]  JAKAFI 10 MG tablet TAKE 1 TABLET (10 MG TOTAL) BY MOUTH 2 TIMES DAILY. 02/23/18   Karen Kitchens, NP  polyethylene glycol (MIRALAX / GLYCOLAX) packet Take 17 g by mouth daily.    [provider]  potassium chloride (K-DUR,KLOR-CON) 10 MEQ tablet Take 10 mEq by mouth as needed.  TAKE WITH LASIX PRN    [provider]    Allergies Sulfamethoxazole-trimethoprim  Family History  Problem Relation Age of Onset  . Brain cancer Mother   . Heart attack Father   . COPD Father   . Coronary artery disease Father   . Hypertension Unknown   . Arthritis-Osteo Unknown   . Diabetes Brother   . Arthritis Brother   . Osteosarcoma Son   . Bone cancer Son   . Arthritis Sister   . Arthritis Brother   . Hypertension Brother   . Coronary artery disease Brother     Social History Social History   Tobacco Use  . Smoking status: Never Smoker  . Smokeless tobacco: Never Used  Substance Use Topics  . Alcohol use: No  . Drug use: No    Review of Systems Constitutional: No fever/chills Eyes: No visual changes. ENT: No sore throat. No stiff neck no neck pain Cardiovascular: Denies chest  pain. Respiratory: Denies shortness of breath. Gastrointestinal: See HPI Genitourinary: Negative for dysuria. Musculoskeletal: Negative lower extremity swelling Skin: Negative for rash. Neurological: Negative for severe headaches, focal weakness or numbness.   ____________________________________________   PHYSICAL EXAM:  VITAL SIGNS: ED Triage Vitals  Enc Vitals Group     BP 02/23/18 1826 135/78     Pulse Rate 02/23/18 1826 88     Resp 02/23/18 1826 (!) 22     Temp 02/23/18 1826 98.1 F (36.7 C)     Temp Source 02/23/18 1826 Oral     SpO2 02/23/18 1826 (!) 86 %     Weight 02/23/18 1823 200 lb (90.7 kg)     Height 02/23/18 1827 5\' 7"  (1.702 m)     Head Circumference --      Peak Flow --      Pain Score --      Pain Loc --      Pain Edu? --      Excl. in [place]? --     Constitutional: Alert and oriented. Well appearing and in no acute distress. Eyes: Conjunctivae are normal Head: Atraumatic HEENT: No congestion/rhinnorhea. Mucous membranes are moist.  Oropharynx non-erythematous Neck:   Nontender with no meningismus, no masses, no stridor Cardiovascular: Normal rate, regular rhythm. Grossly normal heart sounds.  Good peripheral circulation. Respiratory: Normal respiratory effort.  No retractions.  Most in the bases, poor respiratory effort noted Abdominal: Soft and nontender. No distention. No guarding no rebound, ostomy bag in place, there is brown stool in there Back:  There is no focal tenderness or step off.  there is no midline tenderness there are no lesions noted. there is no CVA tenderness Musculoskeletal: No lower extremity tenderness, no upper extremity tenderness. No joint effusions, no DVT signs strong distal pulses no edema prior amputations of the foot noted. Neurologic:  Normal speech and language. No gross focal neurologic deficits are appreciated.  Skin:  Skin is warm, dry and intact. No rash noted. Psychiatric: Mood and affect are normal. Speech and behavior  are normal.  ____________________________________________   LABS (all labs ordered are listed, but only abnormal results are displayed)  Labs Reviewed  LIPASE, BLOOD  COMPREHENSIVE METABOLIC PANEL  CBC  URINALYSIS, COMPLETE (UACMP) WITH MICROSCOPIC  PROTIME-INR  TROPONIN I  BRAIN NATRIURETIC PEPTIDE    Pertinent labs  results that were available during my care of the patient were reviewed by me and considered in my medical decision making (see chart for details). ____________________________________________  EKG  I personally interpreted  any EKGs ordered by me or triage Bundle-branch block, rate 85 bpm, ST changes associated with that. ____________________________________________  RADIOLOGY  Pertinent labs & imaging results that were available during my care of the patient were reviewed by me and considered in my medical decision making (see chart for details). If possible, patient and/or family made aware of any abnormal findings.  No results found. ____________________________________________    PROCEDURES  Procedure(s) performed: None  Procedures  Critical Care performed: None  ____________________________________________   INITIAL IMPRESSION / ASSESSMENT AND PLAN / ED COURSE  Pertinent labs & imaging results that were available during my care of the patient were reviewed by me and considered in my medical decision making (see chart for details).  Stating that she was having abdominal pain but she is not having very much at this time and I cannot really reproduce it.  She has multiple reasons however to have abdominal pain, we will obtain x-ray and blood work and reassess that closely.  I am more concerned about her oxygen saturation.  She has multiple reasons to have possibly low sats but I cannot find any documentation that she is supposed to be on oxygen.  On 2 L she is 99% but her sats are in the 80s without that.  She has a history of PEs she is on chronic  Coumadin, he also has a history of CHF and states her legs were swollen a few days ago but now are better.  We will start with x-ray, BNP and troponin and EKG on this and we will continue as needed to further imaging.  ----------------------------------------- 10:00 PM on 02/23/2018 -----------------------------------------  Skin reassuring, patient does have a urinary tract infection which possibly is Pseudomonas which is only sensitive to gent and prepped house and independent, and she is allergic to penicillin.  We may need to use imipenem.  CT chest does not show any evidence of PE but she has some pulmonary edema which we will give Lasix for because of her hypoxia and she will require admission for both of things I think Dr/ fitagerald agrees w meropen.   ____________________________________________   FINAL CLINICAL IMPRESSION(S) / ED DIAGNOSES  Final diagnoses:  None      This chart was dictated using voice recognition software.  Despite best efforts to proofread,  errors can occur which can change meaning.      Schuyler Amor, MD 02/23/18 1851    Schuyler Amor, MD 02/23/18 2201    Schuyler Amor, MD 02/23/18 2224

## 2018-02-24 ENCOUNTER — Other Ambulatory Visit: Payer: Self-pay

## 2018-02-24 ENCOUNTER — Encounter: Payer: Self-pay | Admitting: *Deleted

## 2018-02-24 LAB — CBC
HEMATOCRIT: 40.6 % (ref 35.0–47.0)
HEMOGLOBIN: 12.3 g/dL (ref 12.0–16.0)
MCH: 24.9 pg — AB (ref 26.0–34.0)
MCHC: 30.3 g/dL — AB (ref 32.0–36.0)
MCV: 82.3 fL (ref 80.0–100.0)
Platelets: 836 10*3/uL — ABNORMAL HIGH (ref 150–440)
RBC: 4.94 MIL/uL (ref 3.80–5.20)
RDW: 37.6 % — AB (ref 11.5–14.5)
WBC: 19.5 10*3/uL — ABNORMAL HIGH (ref 3.6–11.0)

## 2018-02-24 LAB — DIFFERENTIAL
BAND NEUTROPHILS: 1 %
BASOS ABS: 0.2 10*3/uL — AB (ref 0–0.1)
Basophils Relative: 1 %
Blasts: 0 %
EOS ABS: 0 10*3/uL (ref 0–0.7)
Eosinophils Relative: 0 %
LYMPHS PCT: 6 %
Lymphs Abs: 1.1 10*3/uL (ref 1.0–3.6)
MONO ABS: 1.7 10*3/uL — AB (ref 0.2–0.9)
MYELOCYTES: 3 %
Metamyelocytes Relative: 1 %
Monocytes Relative: 9 %
NEUTROS ABS: 15.3 10*3/uL — AB (ref 1.4–6.5)
NEUTROS PCT: 75 %
Other: 4 %
PROMYELOCYTES RELATIVE: 0 %
nRBC: 3 /100 WBC — ABNORMAL HIGH

## 2018-02-24 LAB — BASIC METABOLIC PANEL
ANION GAP: 7 (ref 5–15)
BUN: 12 mg/dL (ref 6–20)
CO2: 27 mmol/L (ref 22–32)
Calcium: 8.7 mg/dL — ABNORMAL LOW (ref 8.9–10.3)
Chloride: 104 mmol/L (ref 101–111)
Creatinine, Ser: 0.47 mg/dL (ref 0.44–1.00)
GFR calc Af Amer: >60 mL/min (ref >60)
GLUCOSE: 73 mg/dL (ref 65–99)
POTASSIUM: 3.9 mmol/L (ref 3.5–5.1)
Sodium: 138 mmol/L (ref 135–145)

## 2018-02-24 LAB — GLUCOSE, CAPILLARY: Glucose-Capillary: 73 mg/dL (ref 65–99)

## 2018-02-24 LAB — PATHOLOGIST SMEAR REVIEW

## 2018-02-24 MED ORDER — TRAZODONE HCL 50 MG PO TABS: 25.0000 mg | ORAL_TABLET | Freq: Every evening | ORAL | Status: DC | PRN

## 2018-02-24 MED ORDER — BISACODYL 5 MG PO TBEC: 5.0000 mg | DELAYED_RELEASE_TABLET | Freq: Every day | ORAL | Status: DC | PRN

## 2018-02-24 MED ORDER — ONDANSETRON HCL 4 MG PO TABS: 4.0000 mg | ORAL_TABLET | Freq: Four times a day (QID) | ORAL | Status: DC | PRN

## 2018-02-24 MED ORDER — ACETAMINOPHEN 650 MG RE SUPP: 650.0000 mg | Freq: Four times a day (QID) | RECTAL | Status: DC | PRN

## 2018-02-24 MED ORDER — FOLIC ACID 1 MG PO TABS
0.5000 mg | ORAL_TABLET | Freq: Every day | ORAL | Status: DC
  Administered 2018-02-24 – 2018-02-26 (×3): 0.5 mg via ORAL
  Filled 2018-02-24 (×3): qty 1

## 2018-02-24 MED ORDER — FOLIC ACID 400 MCG PO TABS: 400.0000 ug | ORAL_TABLET | Freq: Every day | ORAL | Status: DC

## 2018-02-24 MED ORDER — HYDROXYUREA 500 MG PO CAPS
2000.0000 mg | ORAL_CAPSULE | Freq: Every day | ORAL | Status: DC
  Administered 2018-02-24 – 2018-02-25 (×2): 2000 mg via ORAL
  Filled 2018-02-24 (×2): qty 4

## 2018-02-24 MED ORDER — IPRATROPIUM-ALBUTEROL 0.5-2.5 (3) MG/3ML IN SOLN
3.0000 mL | RESPIRATORY_TRACT | Status: DC
  Administered 2018-02-24: 3 mL via RESPIRATORY_TRACT
  Filled 2018-02-24: qty 3

## 2018-02-24 MED ORDER — IPRATROPIUM-ALBUTEROL 0.5-2.5 (3) MG/3ML IN SOLN
3.0000 mL | Freq: Two times a day (BID) | RESPIRATORY_TRACT | Status: DC
  Administered 2018-02-25 – 2018-02-26 (×4): 3 mL via RESPIRATORY_TRACT
  Filled 2018-02-24 (×4): qty 3

## 2018-02-24 MED ORDER — APIXABAN 5 MG PO TABS
5.0000 mg | ORAL_TABLET | Freq: Two times a day (BID) | ORAL | Status: DC
  Administered 2018-02-24 – 2018-02-26 (×6): 5 mg via ORAL
  Filled 2018-02-24 (×6): qty 1

## 2018-02-24 MED ORDER — BOOST PO LIQD
240.0000 mL | Freq: Three times a day (TID) | ORAL | Status: DC
  Administered 2018-02-24 – 2018-02-26 (×8): 240 mL via ORAL
  Filled 2018-02-24: qty 240

## 2018-02-24 MED ORDER — RUXOLITINIB PHOSPHATE 10 MG PO TABS
10.0000 mg | ORAL_TABLET | Freq: Two times a day (BID) | ORAL | Status: DC
  Administered 2018-02-24 – 2018-02-26 (×5): 10 mg via ORAL
  Filled 2018-02-24 (×4): qty 1

## 2018-02-24 MED ORDER — ASPIRIN EC 81 MG PO TBEC
81.0000 mg | DELAYED_RELEASE_TABLET | Freq: Every day | ORAL | Status: DC
  Administered 2018-02-24 – 2018-02-26 (×3): 81 mg via ORAL
  Filled 2018-02-24 (×3): qty 1

## 2018-02-24 MED ORDER — IPRATROPIUM-ALBUTEROL 0.5-2.5 (3) MG/3ML IN SOLN: 3.0000 mL | RESPIRATORY_TRACT | Status: DC | PRN

## 2018-02-24 MED ORDER — ONDANSETRON HCL 4 MG/2ML IJ SOLN
4.0000 mg | Freq: Four times a day (QID) | INTRAMUSCULAR | Status: DC | PRN
  Administered 2018-02-24: 4 mg via INTRAVENOUS
  Filled 2018-02-24: qty 2

## 2018-02-24 MED ORDER — RUXOLITINIB PHOSPHATE 10 MG PO TABS
15.0000 mg | ORAL_TABLET | Freq: Two times a day (BID) | ORAL | Status: DC
  Filled 2018-02-24: qty 2

## 2018-02-24 MED ORDER — AMLODIPINE BESYLATE 5 MG PO TABS
5.0000 mg | ORAL_TABLET | Freq: Every day | ORAL | Status: DC
  Administered 2018-02-25 – 2018-02-26 (×2): 5 mg via ORAL
  Filled 2018-02-24 (×3): qty 1

## 2018-02-24 MED ORDER — ACETAMINOPHEN 325 MG PO TABS
650.0000 mg | ORAL_TABLET | Freq: Four times a day (QID) | ORAL | Status: DC | PRN
  Filled 2018-02-24: qty 2

## 2018-02-24 MED ORDER — IPRATROPIUM-ALBUTEROL 0.5-2.5 (3) MG/3ML IN SOLN
3.0000 mL | Freq: Four times a day (QID) | RESPIRATORY_TRACT | Status: DC
  Administered 2018-02-24 (×2): 3 mL via RESPIRATORY_TRACT
  Filled 2018-02-24: qty 3

## 2018-02-24 MED ORDER — DOCUSATE SODIUM 100 MG PO CAPS
100.0000 mg | ORAL_CAPSULE | Freq: Two times a day (BID) | ORAL | Status: DC
  Administered 2018-02-24 – 2018-02-26 (×6): 100 mg via ORAL
  Filled 2018-02-24 (×6): qty 1

## 2018-02-24 MED ORDER — HYDROCODONE-ACETAMINOPHEN 5-325 MG PO TABS
1.0000 | ORAL_TABLET | ORAL | Status: DC | PRN
  Administered 2018-02-24: 1 via ORAL
  Filled 2018-02-24 (×2): qty 1

## 2018-02-24 NOTE — Progress Notes (Signed)
[place] [place] NAME: Yolanda Wells    MR#:  [id number]  DATE OF BIRTH:  12/31/1931  SUBJECTIVE:  CHIEF COMPLAINT:   Chief Complaint  Patient presents with  . Abdominal Pain  Patient feeling better this morning, no events overnight per nursing staff  REVIEW OF SYSTEMS:  CONSTITUTIONAL: No fever, fatigue or weakness.  EYES: No blurred or double vision.  EARS, NOSE, AND THROAT: No tinnitus or ear pain.  RESPIRATORY: No cough, shortness of breath, wheezing or hemoptysis.  CARDIOVASCULAR: No chest pain, orthopnea, edema.  GASTROINTESTINAL: No nausea, vomiting, diarrhea or abdominal pain.  GENITOURINARY: No dysuria, hematuria.  ENDOCRINE: No polyuria, nocturia,  HEMATOLOGY: No anemia, easy bruising or bleeding SKIN: No rash or lesion. MUSCULOSKELETAL: No joint pain or arthritis.   NEUROLOGIC: No tingling, numbness, weakness.  PSYCHIATRY: No anxiety or depression.   ROS  DRUG ALLERGIES:   Allergies  Allergen Reactions  . Sulfamethoxazole-Trimethoprim Nausea And Vomiting    VITALS:  Blood pressure (!) 100/55, pulse 86, temperature 98.2 F (36.8 C), temperature source Oral, resp. rate 18, height 5\' 7"  (1.702 m), weight 98.9 kg (218 lb), SpO2 99 %.  PHYSICAL EXAMINATION:  GENERAL:  84 y.o.-year-old patient lying in the bed with no acute distress.  EYES: Pupils equal, round, reactive to light and accommodation. No scleral icterus. Extraocular muscles intact.  HEENT: Head atraumatic, normocephalic. Oropharynx and nasopharynx clear.  NECK:  Supple, no jugular venous distention. No thyroid enlargement, no tenderness.  LUNGS: Normal breath sounds bilaterally, no wheezing, rales,rhonchi or crepitation. No use of accessory muscles of respiration.  CARDIOVASCULAR: S1, S2 normal. No murmurs, rubs, or gallops.  ABDOMEN: Soft, nontender, nondistended. Bowel sounds present. No organomegaly or mass.  EXTREMITIES: No pedal edema, cyanosis, or  clubbing.  NEUROLOGIC: Cranial nerves II through XII are intact. Muscle strength 5/5 in all extremities. Sensation intact. Gait not checked.  PSYCHIATRIC: The patient is alert and oriented x 3.  SKIN: No obvious rash, lesion, or ulcer.   Physical Exam LABORATORY PANEL:   CBC Recent Labs  Lab 02/24/18 0456  WBC 19.5*  HGB 12.3  HCT 40.6  PLT 836*   ------------------------------------------------------------------------------------------------------------------  Chemistries  Recent Labs  Lab 02/23/18 1856 02/24/18 0456  NA 138 138  K 3.7 3.9  CL 104 104  CO2 29 27  GLUCOSE 112* 73  BUN 13 12  CREATININE 0.55 0.47  CALCIUM 8.7* 8.7*  AST 29  --   ALT 11*  --   ALKPHOS 73  --   BILITOT 0.6  --    ------------------------------------------------------------------------------------------------------------------  Cardiac Enzymes Recent Labs  Lab 02/23/18 1857  TROPONINI 0.03*   ------------------------------------------------------------------------------------------------------------------  RADIOLOGY:  Dg Chest 2 View  Result Date: 02/23/2018 CLINICAL DATA:  Pain and shortness of breath EXAM: CHEST - 2 VIEW COMPARISON:  February 05, 2018 FINDINGS: There is no edema or consolidation. Heart is mildly enlarged with pulmonary vascularity within normal limits. There is aortic atherosclerosis. No adenopathy. There is degenerative change in each shoulder. IMPRESSION: No edema or consolidation. Mild cardiac enlargement. Aortic atherosclerosis. Aortic Atherosclerosis (ICD10-I70.0). Electronically Signed   By: Lowella Grip III M.D.   On: 02/23/2018 20:11   Ct Angio Chest Pe W And/or Wo Contrast  Result Date: 02/23/2018 CLINICAL DATA:  DVT. Concern for pulmonary embolism. Long-term anticoagulation. Congestive heart failure. Polycythemia vera. Colostomy. Suprapubic pain. EXAM: CT ANGIOGRAPHY CHEST CT ABDOMEN AND PELVIS WITH CONTRAST TECHNIQUE: Multidetector CT imaging of the  chest was performed  using the standard protocol during bolus administration of intravenous contrast. Multiplanar CT image reconstructions and MIPs were obtained to evaluate the vascular anatomy. Multidetector CT imaging of the abdomen and pelvis was performed using the standard protocol during bolus administration of intravenous contrast. CONTRAST:  100 cc Isovue COMPARISON:  None. FINDINGS: CTA CHEST FINDINGS Cardiovascular: No filling defects within the pulmonary arteries to suggest acute pulmonary embolism. No acute findings of the aorta or great vessels. No pericardial fluid. The pulmonary artery is dilated 45 mm. Calcification of the aortic arch. No pericardial effusion. Mediastinum/Nodes: No axillary supraclavicular adenopathy. No mediastinal hilar adenopathy. Lungs/Pleura: Bibasilar atelectasis. No pulmonary infarction or infiltrate. Mild ground-glass opacities Musculoskeletal: No aggressive osseous lesion. Degenerate spurring of the thoracic spine Review of the MIP images confirms the above findings. CT ABDOMEN and PELVIS FINDINGS Hepatobiliary: No focal hepatic lesion. Gallstones in a nondistended gallbladder. Pancreas: Pancreas is normal. No ductal dilatation. No pancreatic inflammation. Spleen: Normal spleen Adrenals/urinary tract: Adrenal glands normal. Bilateral nonenhancing renal cysts. No obstructing ureteral obstruction. Bladder normal Stomach/Bowel: Stomach, small-bowel cecum normal. Colostomy in the LEFT lower quadrant without complication. There is peristomal hernia which contains a nonobstructed loop of small bowel. The small bowel entering the peristomal hernia measures approximately 4 cm (image 50/11). Excluded descending colon and rectum appear normal Vascular/Lymphatic: Calcified abdominal aorta. No aneurysm. Infrarenal IVC filter noted. No retroperitoneal lymphadenopathy. No pelvic lymphadenopathy Reproductive: Post hysterectomy anatomy Other: No free fluid. Musculoskeletal: No aggressive  osseous lesion. IMPRESSION: Chest Impression: 1. No evidence acute pulmonary embolism. 2. Dilatation of pulmonary artery suggests pulmonary hypertension. 3. Mild ground-glass opacities suggests mild pulmonary edema. Abdomen / Pelvis Impression: 1. Peristomal hernia containing nonobstructed small bowel. 2. No evidence of bowel obstruction or inflammation. 3.  Aortic Atherosclerosis (ICD10-I70.0). Electronically Signed   By: Suzy Bouchard M.D.   On: 02/23/2018 21:48   Ct Abdomen Pelvis W Contrast  Result Date: 02/23/2018 CLINICAL DATA:  DVT. Concern for pulmonary embolism. Long-term anticoagulation. Congestive heart failure. Polycythemia vera. Colostomy. Suprapubic pain. EXAM: CT ANGIOGRAPHY CHEST CT ABDOMEN AND PELVIS WITH CONTRAST TECHNIQUE: Multidetector CT imaging of the chest was performed using the standard protocol during bolus administration of intravenous contrast. Multiplanar CT image reconstructions and MIPs were obtained to evaluate the vascular anatomy. Multidetector CT imaging of the abdomen and pelvis was performed using the standard protocol during bolus administration of intravenous contrast. CONTRAST:  100 cc Isovue COMPARISON:  None. FINDINGS: CTA CHEST FINDINGS Cardiovascular: No filling defects within the pulmonary arteries to suggest acute pulmonary embolism. No acute findings of the aorta or great vessels. No pericardial fluid. The pulmonary artery is dilated 45 mm. Calcification of the aortic arch. No pericardial effusion. Mediastinum/Nodes: No axillary supraclavicular adenopathy. No mediastinal hilar adenopathy. Lungs/Pleura: Bibasilar atelectasis. No pulmonary infarction or infiltrate. Mild ground-glass opacities Musculoskeletal: No aggressive osseous lesion. Degenerate spurring of the thoracic spine Review of the MIP images confirms the above findings. CT ABDOMEN and PELVIS FINDINGS Hepatobiliary: No focal hepatic lesion. Gallstones in a nondistended gallbladder. Pancreas: Pancreas is  normal. No ductal dilatation. No pancreatic inflammation. Spleen: Normal spleen Adrenals/urinary tract: Adrenal glands normal. Bilateral nonenhancing renal cysts. No obstructing ureteral obstruction. Bladder normal Stomach/Bowel: Stomach, small-bowel cecum normal. Colostomy in the LEFT lower quadrant without complication. There is peristomal hernia which contains a nonobstructed loop of small bowel. The small bowel entering the peristomal hernia measures approximately 4 cm (image 50/11). Excluded descending colon and rectum appear normal Vascular/Lymphatic: Calcified abdominal aorta. No aneurysm. Infrarenal IVC filter noted. No retroperitoneal  lymphadenopathy. No pelvic lymphadenopathy Reproductive: Post hysterectomy anatomy Other: No free fluid. Musculoskeletal: No aggressive osseous lesion. IMPRESSION: Chest Impression: 1. No evidence acute pulmonary embolism. 2. Dilatation of pulmonary artery suggests pulmonary hypertension. 3. Mild ground-glass opacities suggests mild pulmonary edema. Abdomen / Pelvis Impression: 1. Peristomal hernia containing nonobstructed small bowel. 2. No evidence of bowel obstruction or inflammation. 3.  Aortic Atherosclerosis (ICD10-I70.0). Electronically Signed   By: Suzy Bouchard M.D.   On: 02/23/2018 21:48   Dg Abd 2 Views  Result Date: 02/23/2018 CLINICAL DATA:  Colostomy not draining.  Abdominal pain EXAM: ABDOMEN - 2 VIEW COMPARISON:  CT 01/08/2018 FINDINGS: No dilated large or small bowel. LEFT lower quadrant ostomy noted. IVC filter noted. Bilateral hip prosthetics IMPRESSION: No evidence of bowel obstruction. Electronically Signed   By: Suzy Bouchard M.D.   On: 02/23/2018 20:06    ASSESSMENT AND PLAN:  *Acute on chronic diastolic congestive heart failure exacerbation  Resolving  Suspect secondary to acute urinary tract infection Continue CHF protocol, IV Lasix, aspirin, on Eliquis, strict I&O monitoring, daily weights, supplemental oxygen with weaning as  tolerated, echocardiogram in December 2018 had shown diastolic dysfunction stage I/ejection fraction 55%, and continue close medical monitoring   *Acute hypoxic respiratory failure  Secondary to above  Continue supplemental oxygen with weaning as tolerated   *Acute UTI  Stable  Continue empiric meropenem and follow-up on cultures  *Chronic Polycythemia vera stable on current regiment  *Chronic Colostomy Stable s/p resection in the past CT abdomen noted for parastomal hernia-no obstruction   *History of PE/DVT Stable Lower extremity Doppler noted for left DVT Continue Eliquis  Disposition to home in 1-2 days barring any complications  All the records are reviewed and case discussed with Care Management/Social Workerr. Management plans discussed with the patient, family and they are in agreement.  CODE STATUS: full  TOTAL TIME TAKING CARE OF THIS PATIENT: 35 minutes.     POSSIBLE D/C IN 1-2 DAYS, DEPENDING ON CLINICAL CONDITION.   Avel Peace Kriston Mckinnie M.D on 02/24/2018   Between 7am to 6pm - Pager - [phone]  After 6pm go to [email or web address] - password [email or web address] [facility]  Office  [phone]  CC: Primary care physician; [name], MD  Note: This dictation was prepared with Dragon dictation along with smaller phrase technology. Any transcriptional errors that result from this process are unintentional.

## 2018-02-24 NOTE — Progress Notes (Signed)
Pt did not brought her JAKAFI medication from home. Per pharmacy she needs to bring her own medication supply in order for [place] to give it here at the hospital. RN call family but they did not answer the phone. RN left a voice mail so they can call back.

## 2018-02-24 NOTE — ED Notes (Signed)
Pt had large amount of urine, saturated the depend and the bed.  Cleaned pt up and changed bed and gown.

## 2018-02-25 DIAGNOSIS — Z86711 Personal history of pulmonary embolism: Secondary | ICD-10-CM

## 2018-02-25 DIAGNOSIS — J9601 Acute respiratory failure with hypoxia: Secondary | ICD-10-CM

## 2018-02-25 DIAGNOSIS — D45 Polycythemia vera: Secondary | ICD-10-CM

## 2018-02-25 DIAGNOSIS — Z7901 Long term (current) use of anticoagulants: Secondary | ICD-10-CM

## 2018-02-25 DIAGNOSIS — I82412 Acute embolism and thrombosis of left femoral vein: Secondary | ICD-10-CM

## 2018-02-25 DIAGNOSIS — N390 Urinary tract infection, site not specified: Secondary | ICD-10-CM

## 2018-02-25 DIAGNOSIS — Z8673 Personal history of transient ischemic attack (TIA), and cerebral infarction without residual deficits: Secondary | ICD-10-CM

## 2018-02-25 LAB — CBC WITH DIFFERENTIAL/PLATELET
BASOS PCT: 0 %
Basophils Absolute: 0 10*3/uL (ref 0–0.1)
EOS ABS: 0 10*3/uL (ref 0–0.7)
EOS PCT: 0 %
HCT: 35.9 % (ref 35.0–47.0)
HEMOGLOBIN: 11 g/dL — AB (ref 12.0–16.0)
LYMPHS PCT: 5 %
Lymphs Abs: 1 10*3/uL (ref 1.0–3.6)
MCH: 25 pg — ABNORMAL LOW (ref 26.0–34.0)
MCHC: 30.7 g/dL — ABNORMAL LOW (ref 32.0–36.0)
MCV: 81.3 fL (ref 80.0–100.0)
MONO ABS: 2.4 10*3/uL — AB (ref 0.2–0.9)
Monocytes Relative: 12 %
NEUTROS PCT: 83 %
Neutro Abs: 16.9 10*3/uL — ABNORMAL HIGH (ref 1.4–6.5)
PLATELETS: 875 10*3/uL — AB (ref 150–440)
RBC: 4.41 MIL/uL (ref 3.80–5.20)
RDW: 37.5 % — ABNORMAL HIGH (ref 11.5–14.5)
WBC: 20.3 10*3/uL — ABNORMAL HIGH (ref 3.6–11.0)

## 2018-02-25 LAB — CBC
HCT: 34.5 % — ABNORMAL LOW (ref 35.0–47.0)
Hemoglobin: 10.7 g/dL — ABNORMAL LOW (ref 12.0–16.0)
MCH: 25.3 pg — ABNORMAL LOW (ref 26.0–34.0)
MCHC: 31 g/dL — ABNORMAL LOW (ref 32.0–36.0)
MCV: 81.4 fL (ref 80.0–100.0)
Platelets: 829 K/uL — ABNORMAL HIGH (ref 150–440)
RBC: 4.23 MIL/uL (ref 3.80–5.20)
RDW: 37.6 % — ABNORMAL HIGH (ref 11.5–14.5)
WBC: 21.2 K/uL — ABNORMAL HIGH (ref 3.6–11.0)

## 2018-02-25 LAB — GLUCOSE, CAPILLARY: Glucose-Capillary: 106 mg/dL — ABNORMAL HIGH (ref 65–99)

## 2018-02-25 MED ORDER — FUROSEMIDE 40 MG PO TABS
40.0000 mg | ORAL_TABLET | Freq: Every day | ORAL | Status: DC
  Administered 2018-02-25 – 2018-02-26 (×2): 40 mg via ORAL
  Filled 2018-02-25 (×2): qty 1

## 2018-02-25 MED ORDER — BENZONATATE 100 MG PO CAPS
100.0000 mg | ORAL_CAPSULE | Freq: Three times a day (TID) | ORAL | Status: AC
  Administered 2018-02-25 – 2018-02-26 (×6): 100 mg via ORAL
  Filled 2018-02-25 (×6): qty 1

## 2018-02-25 MED ORDER — HYDROXYUREA 500 MG PO CAPS
1000.0000 mg | ORAL_CAPSULE | Freq: Every day | ORAL | Status: DC
  Administered 2018-02-26: 1000 mg via ORAL
  Filled 2018-02-25: qty 2

## 2018-02-25 MED FILL — JAKAFI 10 MG TABLET: 10 | 30 days supply | Qty: 60 | Fill #0

## 2018-02-25 NOTE — Consult Note (Signed)
[facility]  Date of admission:  02/23/2018  Inpatient day:  02/25/2018  Consulting physician: Dr. Holly Bodily Salary.   Reason for Consultation:  Polycythemia  Chief Complaint: Yolanda Wells is a 84 y.o. female with polycythemia rubra vera (PV) who was admitted through the emergency room with acute respiratory failure with hypoxia.  HPI:  The patient has a polycythemia rubra vera.  Counts have been poorly controlled with hydroxyurea 2 gm/day.  She began Jakafi 10 mg BID on 02/02/2018.  Because of a dramatic drop in counts on 02/17/2018, hydroxyurea was decreased to 500 mg/day.  Recent counts: 01/29/2018:  Hematocrit 42.7, hemoglobin 13.7, platelets 1,245,000, WBC 5500 with an ANC of 3600. 02/05/2018:  Hematocrit 38.5, hemoglobin 12.6, platelets 1,038,000, WBC 3100 with an ANC of 1400. 02/17/2018:  Hematocrit 39.9, hemoglobin 12.9, platelets 582,000, WBC 2800 with an ANC of 1500. 02/23/2018:  Hematocrit 38.7, hemoglobin 12.3, platelets 900,000, WBC 19,100 with an ANC of 1530. 02/24/2018:  Hematocrit 40.6, hemoglobin 12.3, platelets 836,000, WBC 19,500. 02/25/2018:  Hematocrit 34.5, hemoglobin 10.7, platelets 829,000, WBC 20,300 with an ANC of 16,900.  She presented to the ER on 02/23/2018 with lower abdominal discomfort.  Symptoms were transient.  She also noted shortness of breath and worsening lower extremity edema x 3-4 days.  UA revealed a UTI.    Chest CT angiogram revealed no evidence of pulmonary embolism.  There was suggestion of pulmonary hypertension and mild pulmonary edema.  Abdomen and pelvic CT revealed a peristomal hernia containing non-obstructed small bowel.  There was no evidence of obstruction.  She was switched from Coumadin to Eliquis on 02/05/2018 for a new nonocclusive thrombus in the left common femoral vein and superficial femoral vein.  She has had a difficult to control INR.  INR was sub-therapeutic at the time.  Symptomatically, she denies any  abdominal pain or shortness of breath.     Past Medical History:  Diagnosis Date  . Acute deep vein thrombosis (DVT) of distal vein of left lower extremity ([place]) 10/03/2015  . Acute pulmonary embolism ([place]) 10/03/2015  . Anticoagulant long-term use   . Arthritis   . CHF (congestive heart failure) ([place])   . Chronic anticoagulation 10/03/2015  . Collagen vascular disease ([place])   . Colostomy in place [facility]) 10/04/2015  . Deep venous thrombosis ([facility])    right lower extremity  . Dependent edema   . Diverticulosis of intestine with bleeding 04/02/2015  . Glenohumeral arthritis 06/28/2012  . Hammertoe 09/02/2012  . History of bilateral hip replacements 09/02/2012  . History of hysterectomy   . Hypertension   . Hypokalemia   . Inability to ambulate due to hip 10/12/2015  . Mandibular dysfunction   . Onychomycosis 09/02/2012  . Osteoarthritis   . Polycythemia vera ([place])   . Presence of IVC filter 05/25/2015  . 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 Urinary incontinence in female 09/02/2016    Past Surgical History:  Procedure Laterality Date  . ABDOMINAL HYSTERECTOMY    . BLEPHAROPLASTY Right 03/2017   right upper eyelid  . CARDIAC CATHETERIZATION Right 04/03/2015   Procedure: CENTRAL LINE INSERTION;  Surgeon: Sherri Rad, MD;  Location: [facility] ORS;  Service: General;  Laterality: Right;  . COLECTOMY WITH COLOSTOMY CREATION/HARTMANN PROCEDURE N/A 04/03/2015   Procedure: COLECTOMY WITH COLOSTOMY CREATION/HARTMANN PROCEDURE;  Surgeon: Sherri Rad, MD;  Location: [facility] ORS;  Service: General;  Laterality: N/A;  . COLON SURGERY    . FOOT AMPUTATION     partial  . FOOT  AMPUTATION Right   . JOINT REPLACEMENT     Left and Right Hip  . PERIPHERAL VASCULAR CATHETERIZATION N/A 04/02/2015   Procedure: Visceral Angiography;  Surgeon: Algernon Huxley, MD;  Location: [place] CV LAB;  Service: Cardiovascular;  Laterality: N/A;  . PERIPHERAL VASCULAR CATHETERIZATION N/A 04/02/2015   Procedure:  Visceral Artery Intervention;  Surgeon: Algernon Huxley, MD;  Location: [place] CV LAB;  Service: Cardiovascular;  Laterality: N/A;  . PERIPHERAL VASCULAR CATHETERIZATION N/A 05/25/2015   Procedure: IVC Filter Insertion;  Surgeon: Katha Cabal, MD;  Location: [place] [facility];  Service: Cardiovascular;  Laterality: N/A;  . TOE AMPUTATION     right  . TOTAL HIP ARTHROPLASTY      Family History  Problem Relation Age of Onset  . Brain cancer Mother   . Heart attack Father   . COPD Father   . Coronary artery disease Father   . Hypertension Unknown   . Arthritis-Osteo Unknown   . Diabetes Brother   . Arthritis Brother   . Osteosarcoma Son   . Bone cancer Son   . Arthritis Sister   . Arthritis Brother   . Hypertension Brother   . Coronary artery disease Brother     Social History:  reports that she has never smoked. She has never used smokeless tobacco. She reports that she does not drink alcohol or use drugs.  Her granddaughter's name is [name].  She is alone today.  Allergies:  Allergies  Allergen Reactions  . Sulfamethoxazole-Trimethoprim Nausea And Vomiting    Medications Prior to Admission  Medication Sig Dispense Refill  . amLODipine (NORVASC) 5 MG tablet Take 5 mg by mouth daily.    [name] aspirin EC 81 MG tablet Take 1 tablet (81 mg total) by mouth daily. 30 tablet 2  . docusate sodium (COLACE) 100 MG capsule Take 100 mg by mouth 2 (two) times daily.     [name] ELIQUIS STARTER PACK (ELIQUIS STARTER PACK) 5 MG TABS Take as directed on package: start with two-50m tablets twice daily for 7 days. On day 8, switch to one-573mtablet twice daily. 1 each 0  . ENSURE (ENSURE) Take 1 Can by mouth 3 (three) times daily with meals.    . folic acid (FOLVITE) 40793CG tablet Take 400 mcg by mouth daily.    . furosemide (LASIX) 20 MG tablet Take 20 mg by mouth daily.    . hydroxyurea (HYDREA) 500 MG capsule TAKE 4 CAPSULES BY MOUTH DAILY 120 capsule 0  . ipratropium-albuterol (DUONEB)  0.5-2.5 (3) MG/3ML SOLN Take 3 mLs by nebulization every 4 (four) hours as needed.    . Marland KitchenAKAFI 10 MG tablet TAKE 1 TABLET (10 MG TOTAL) BY MOUTH 2 TIMES DAILY. 60 tablet 0  . polyethylene glycol (MIRALAX / GLYCOLAX) packet Take 17 g by mouth daily.    . potassium chloride (K-DUR,KLOR-CON) 10 MEQ tablet Take 10 mEq by mouth as needed. TAKE WITH LASIX PRN      Review of Systems: GENERAL:  Feels "ok".  No fevers or sweats.  Weight fluctuates secondary to fluid. PERFORMANCE STATUS (ECOG):  3 HEENT: Decreased vision in right eye s/p embolic event. No runny nose, sore throat, mouth sores or tenderness. Lungs: Shortness of breath (baseline).  No cough. No hemoptysis. Cardiac: No chest pain, palpitations, or PND. Orthopnea.  GI: No nausea, vomiting, diarrhea, constipation, melena or hematochezia. GU: No urgency, frequency, dysuria, or hematuria. UTI.  Incontinent.Wears adult diapers. Musculoskeletal: Chronic knee problems. Left  leg weak.  No muscle tenderness. Extremities: Chronic swelling in legs. Skin: Dry skin.  No rashes. Neuro: No headache, numbness or weakness, balance or coordination issues. Endocrine: No diabetes, thyroid issues, hot flashes or night sweats. Psych: No change in mood, anxiety or depression.  Pain: No focal pain. Review of systems: All other systems reviewed and found to be negative.  Physical Exam:  Blood pressure 120/69, pulse 79, temperature 98.2 F (36.8 C), temperature source Oral, resp. rate 17, height 5' 7" (1.702 m), weight 213 lb (96.6 kg), SpO2 97 %.  GENERAL:Elderly woman \lying comfortably in bed on the medical unit in no acute distress. MENTAL STATUS: Alert and oriented to person, place and time. HEAD:Gray hair. Normocephalic, atraumatic, face symmetric, no Cushingoid features. EYES:Brown eyes. Pupils equal round and reactive to light and accomodation. No conjunctivitis or scleral icterus. BWG:[id number] clear without lesion.  Upper dentures. Tonguenormal. Mucous membranes moist. RESPIRATORY:Clear to auscultationwithout rales, wheezes or rhonchi. CARDIOVASCULAR:Regular rate andrhythmwithout murmur, rub or gallop. ABDOMEN:Left sided colostomy. Soft, non-tender, with active bowel sounds, and no appreciable hepatosplenomegaly. No masses. SKIN: No rashes orulcers. EXTREMITIES: Chronic 1+ lower extremity edema (left slightly > right). No skin discoloration or tenderness. No palpable cords. LYMPHNODES: No palpable cervical, supraclavicular, axillary or inguinal adenopathy  NEUROLOGICAL: Unremarkable. PSYCH: Appropriate.   Results for orders placed or performed during the hospital encounter of 02/23/18 (from the past 48 hour(s))  Lipase, blood     Status: None   Collection Time: 02/23/18  6:56 PM  Result Value Ref Range   Lipase 18 11 - 51 U/L    Comment: Performed at [facility], [address] [place], [place] [address]  Comprehensive metabolic panel     Status: Abnormal   Collection Time: 02/23/18  6:56 PM  Result Value Ref Range   Sodium 138 135 - 145 mmol/L   Potassium 3.7 3.5 - 5.1 mmol/L   Chloride 104 101 - 111 mmol/L   CO2 29 22 - 32 mmol/L   Glucose, Bld 112 (H) 65 - 99 mg/dL   BUN 13 6 - 20 mg/dL   Creatinine, Ser 0.55 0.44 - 1.00 mg/dL   Calcium 8.7 (L) 8.9 - 10.3 mg/dL   Total Protein 7.2 6.5 - 8.1 g/dL   Albumin 3.0 (L) 3.5 - 5.0 g/dL   AST 29 15 - 41 U/L   ALT 11 (L) 14 - 54 U/L   Alkaline Phosphatase 73 38 - 126 U/L   Total Bilirubin 0.6 0.3 - 1.2 mg/dL   GFR calc non Af Amer >60 >60 mL/min   GFR calc Af Amer >60 >60 mL/min    Comment: (NOTE) The eGFR has been calculated using the CKD EPI equation. This calculation has not been validated in all clinical situations. eGFR's persistently <60 mL/min signify possible Chronic Kidney Disease.    Anion gap 5 5 - 15    Comment: Performed at [facility], [address] [place], [place] [address]  CBC      Status: Abnormal   Collection Time: 02/23/18  6:56 PM  Result Value Ref Range   WBC 19.1 (H) 3.6 - 11.0 K/uL   RBC 4.68 3.80 - 5.20 MIL/uL   Hemoglobin 12.3 12.0 - 16.0 g/dL   HCT 38.7 35.0 - 47.0 %   MCV 82.7 80.0 - 100.0 fL   MCH 26.2 26.0 - 34.0 pg   MCHC 31.7 (L) 32.0 - 36.0 g/dL   RDW 34.7 (H) 11.5 - 14.5 %   Platelets  900 (H) 150 - 440 K/uL    Comment: Performed at [facility], [address] [place], [place] [address]  Urinalysis, Complete w Microscopic     Status: Abnormal   Collection Time: 02/23/18  6:56 PM  Result Value Ref Range   Color, Urine YELLOW (A) YELLOW   APPearance HAZY (A) CLEAR   Specific Gravity, Urine 1.017 1.005 - 1.030   pH 5.0 5.0 - 8.0   Glucose, UA NEGATIVE NEGATIVE mg/dL   Hgb urine dipstick SMALL (A) NEGATIVE   Bilirubin Urine NEGATIVE NEGATIVE   Ketones, ur NEGATIVE NEGATIVE mg/dL   Protein, ur 30 (A) NEGATIVE mg/dL   Nitrite POSITIVE (A) NEGATIVE   Leukocytes, UA SMALL (A) NEGATIVE   RBC / HPF 0-5 0 - 5 RBC/hpf   WBC, UA TOO NUMEROUS TO COUNT 0 - 5 WBC/hpf   Bacteria, UA FEW (A) NONE SEEN   Squamous Epithelial / LPF 0-5 (A) NONE SEEN   Mucus PRESENT     Comment: Performed at [facility], [address] [place], [place] [address]  Differential     Status: Abnormal   Collection Time: 02/23/18  6:56 PM  Result Value Ref Range   Neutrophils Relative % 75 %   Lymphocytes Relative 6 %   Monocytes Relative 9 %   Eosinophils Relative 0 %   Basophils Relative 1 %   Band Neutrophils 1 %   Metamyelocytes Relative 1 %   Myelocytes 3 %   Promyelocytes Relative 0 %   Blasts 0 %   nRBC 3 (H) 0 /100 WBC   Other 4 %   Neutro Abs 15.3 (H) 1.4 - 6.5 K/uL   Lymphs Abs 1.1 1.0 - 3.6 K/uL   Monocytes Absolute 1.7 (H) 0.2 - 0.9 K/uL   Eosinophils Absolute 0.0 0 - 0.7 K/uL   Basophils Absolute 0.2 (H) 0 - 0.1 K/uL   RBC Morphology MIXED RBC POPULATION    WBC Morphology HYPERSEGMENTED NEUT     Comment: GIANT PLATELETS  SEEN Performed at [facility], [address] [place], [place] [address]   Pathologist smear review     Status: None   Collection Time: 02/23/18  6:56 PM  Result Value Ref Range   Path Review      Blood smear and history reviewed. The patient has JAK2 positive polycythemia vera, on hydroxyurea, and she recently started on Jakafi. There is thrombocythemia consistent with history. Marked RBC anisocytosis and poikilocytosis with teardrop cells and  nucleated RBCs, likely due to the bone marrow fibrosis noted on biopsy in February. There is neutrophilic leukocytosis with immature forms including occasional blasts. Neutrophils show hypersegmentation due to hydroxyurea. The leukoerythroblastic changes  may be due to acute UTI and CHF with hypoxemia, since the recent bone marrow did not show any increase in blasts. Medical oncology follow up is recommended.     Comment: Reviewed by Lemmie Evens. Dicie Beam, MD. Performed at [facility], [address] [place], [place] [address]   Protime-INR     Status: Abnormal   Collection Time: 02/23/18  6:57 PM  Result Value Ref Range   Prothrombin Time 20.0 (H) 11.4 - 15.2 seconds   INR 1.72     Comment: Performed at [facility], [address] [place],  [address]  Troponin I     Status: Abnormal   Collection Time: 02/23/18  6:57 PM  Result Value Ref Range   Troponin I 0.03 (HH) <0.03 ng/mL  Comment: CRITICAL RESULT CALLED TO, READ BACK BY AND VERIFIED WITH ELLEN SLUECKIEGER 02/23/18 1941 KLW Performed at [facility], [address] [place], [place] [address]   Brain natriuretic peptide     Status: None   Collection Time: 02/23/18  6:57 PM  Result Value Ref Range   B Natriuretic Peptide 84.0 0.0 - 100.0 pg/mL    Comment: Performed at [facility], [address] [place], [place] [address]  Urine culture     Status: Abnormal (Preliminary result)   Collection Time: 02/23/18  7:36 PM  Result Value  Ref Range   Specimen Description      URINE, RANDOM Performed at [facility], [address], [place], [place] [address]    Special Requests      NONE Performed at [facility] [address] [place], [place] [address]    Culture (A)     >=100,000 COLONIES/mL ESCHERICHIA COLI SUSCEPTIBILITIES TO FOLLOW Performed at [facility], [place] [address] [place], [place] [address]    Report Status PENDING   Basic metabolic panel     Status: Abnormal   Collection Time: 02/24/18  4:56 AM  Result Value Ref Range   Sodium 138 135 - 145 mmol/L   Potassium 3.9 3.5 - 5.1 mmol/L   Chloride 104 101 - 111 mmol/L   CO2 27 22 - 32 mmol/L   Glucose, Bld 73 65 - 99 mg/dL   BUN 12 6 - 20 mg/dL   Creatinine, Ser 0.47 0.44 - 1.00 mg/dL   Calcium 8.7 (L) 8.9 - 10.3 mg/dL   GFR calc non Af Amer >60 >60 mL/min   GFR calc Af Amer >60 >60 mL/min    Comment: (NOTE) The eGFR has been calculated using the CKD EPI equation. This calculation has not been validated in all clinical situations. eGFR's persistently <60 mL/min signify possible Chronic Kidney Disease.    Anion gap 7 5 - 15    Comment: Performed at [facility], [address] [place], [place] [address]  CBC     Status: Abnormal   Collection Time: 02/24/18  4:56 AM  Result Value Ref Range   WBC 19.5 (H) 3.6 - 11.0 K/uL   RBC 4.94 3.80 - 5.20 MIL/uL   Hemoglobin 12.3 12.0 - 16.0 g/dL    Comment: RESULT REPEATED AND VERIFIED   HCT 40.6 35.0 - 47.0 %   MCV 82.3 80.0 - 100.0 fL   MCH 24.9 (L) 26.0 - 34.0 pg   MCHC 30.3 (L) 32.0 - 36.0 g/dL   RDW 37.6 (H) 11.5 - 14.5 %   Platelets 836 (H) 150 - 440 K/uL    Comment: Performed at [facility], [place] [place],  [address]  Glucose, capillary     Status: None   Collection Time: 02/24/18  7:35 AM  Result Value Ref Range   Glucose-Capillary 73 65 - 99 mg/dL  Glucose, capillary     Status: Abnormal   Collection Time: 02/25/18  8:18 AM   Result Value Ref Range   Glucose-Capillary 106 (H) 65 - 99 mg/dL  CBC     Status: Abnormal   Collection Time: 02/25/18  8:27 AM  Result Value Ref Range   WBC 21.2 (H) 3.6 - 11.0 K/uL   RBC 4.23 3.80 - 5.20 MIL/uL   Hemoglobin 10.7 (L) 12.0 - 16.0 g/dL   HCT 34.5 (L) 35.0 - 47.0 %   MCV 81.4 80.0 - 100.0  fL   MCH 25.3 (L) 26.0 - 34.0 pg   MCHC 31.0 (L) 32.0 - 36.0 g/dL   RDW 37.6 (H) 11.5 - 14.5 %   Platelets 829 (H) 150 - 440 K/uL    Comment: Performed at [facility], [address] [place], [place] [address]  CBC with Differential/Platelet     Status: Abnormal   Collection Time: 02/25/18 11:08 AM  Result Value Ref Range   WBC 20.3 (H) 3.6 - 11.0 K/uL   RBC 4.41 3.80 - 5.20 MIL/uL   Hemoglobin 11.0 (L) 12.0 - 16.0 g/dL   HCT 35.9 35.0 - 47.0 %   MCV 81.3 80.0 - 100.0 fL   MCH 25.0 (L) 26.0 - 34.0 pg   MCHC 30.7 (L) 32.0 - 36.0 g/dL   RDW 37.5 (H) 11.5 - 14.5 %   Platelets 875 (H) 150 - 440 K/uL   Neutrophils Relative % 83 %   Lymphocytes Relative 5 %   Monocytes Relative 12 %   Eosinophils Relative 0 %   Basophils Relative 0 %   Neutro Abs 16.9 (H) 1.4 - 6.5 K/uL   Lymphs Abs 1.0 1.0 - 3.6 K/uL   Monocytes Absolute 2.4 (H) 0.2 - 0.9 K/uL   Eosinophils Absolute 0.0 0 - 0.7 K/uL   Basophils Absolute 0.0 0 - 0.1 K/uL   RBC Morphology MIXED RBC POPULATION     Comment: Performed at [facility], [address], [place], [place] [address]   Dg Chest 2 View  Result Date: 02/23/2018 CLINICAL DATA:  Pain and shortness of breath EXAM: CHEST - 2 VIEW COMPARISON:  February 05, 2018 FINDINGS: There is no edema or consolidation. Heart is mildly enlarged with pulmonary vascularity within normal limits. There is aortic atherosclerosis. No adenopathy. There is degenerative change in each shoulder. IMPRESSION: No edema or consolidation. Mild cardiac enlargement. Aortic atherosclerosis. Aortic Atherosclerosis (ICD10-I70.0). Electronically Signed   By: Lowella Grip III  M.D.   On: 02/23/2018 20:11   Ct Angio Chest Pe W And/or Wo Contrast  Result Date: 02/23/2018 CLINICAL DATA:  DVT. Concern for pulmonary embolism. Long-term anticoagulation. Congestive heart failure. Polycythemia vera. Colostomy. Suprapubic pain. EXAM: CT ANGIOGRAPHY CHEST CT ABDOMEN AND PELVIS WITH CONTRAST TECHNIQUE: Multidetector CT imaging of the chest was performed using the standard protocol during bolus administration of intravenous contrast. Multiplanar CT image reconstructions and MIPs were obtained to evaluate the vascular anatomy. Multidetector CT imaging of the abdomen and pelvis was performed using the standard protocol during bolus administration of intravenous contrast. CONTRAST:  100 cc Isovue COMPARISON:  None. FINDINGS: CTA CHEST FINDINGS Cardiovascular: No filling defects within the pulmonary arteries to suggest acute pulmonary embolism. No acute findings of the aorta or great vessels. No pericardial fluid. The pulmonary artery is dilated 45 mm. Calcification of the aortic arch. No pericardial effusion. Mediastinum/Nodes: No axillary supraclavicular adenopathy. No mediastinal hilar adenopathy. Lungs/Pleura: Bibasilar atelectasis. No pulmonary infarction or infiltrate. Mild ground-glass opacities Musculoskeletal: No aggressive osseous lesion. Degenerate spurring of the thoracic spine Review of the MIP images confirms the above findings. CT ABDOMEN and PELVIS FINDINGS Hepatobiliary: No focal hepatic lesion. Gallstones in a nondistended gallbladder. Pancreas: Pancreas is normal. No ductal dilatation. No pancreatic inflammation. Spleen: Normal spleen Adrenals/urinary tract: Adrenal glands normal. Bilateral nonenhancing renal cysts. No obstructing ureteral obstruction. Bladder normal Stomach/Bowel: Stomach, small-bowel cecum normal. Colostomy in the LEFT lower quadrant without complication. There is peristomal hernia which contains a nonobstructed loop of small bowel. The small bowel entering  the  peristomal hernia measures approximately 4 cm (image 50/11). Excluded descending colon and rectum appear normal Vascular/Lymphatic: Calcified abdominal aorta. No aneurysm. Infrarenal IVC filter noted. No retroperitoneal lymphadenopathy. No pelvic lymphadenopathy Reproductive: Post hysterectomy anatomy Other: No free fluid. Musculoskeletal: No aggressive osseous lesion. IMPRESSION: Chest Impression: 1. No evidence acute pulmonary embolism. 2. Dilatation of pulmonary artery suggests pulmonary hypertension. 3. Mild ground-glass opacities suggests mild pulmonary edema. Abdomen / Pelvis Impression: 1. Peristomal hernia containing nonobstructed small bowel. 2. No evidence of bowel obstruction or inflammation. 3.  Aortic Atherosclerosis (ICD10-I70.0). Electronically Signed   By: Suzy Bouchard M.D.   On: 02/23/2018 21:48   Ct Abdomen Pelvis W Contrast  Result Date: 02/23/2018 CLINICAL DATA:  DVT. Concern for pulmonary embolism. Long-term anticoagulation. Congestive heart failure. Polycythemia vera. Colostomy. Suprapubic pain. EXAM: CT ANGIOGRAPHY CHEST CT ABDOMEN AND PELVIS WITH CONTRAST TECHNIQUE: Multidetector CT imaging of the chest was performed using the standard protocol during bolus administration of intravenous contrast. Multiplanar CT image reconstructions and MIPs were obtained to evaluate the vascular anatomy. Multidetector CT imaging of the abdomen and pelvis was performed using the standard protocol during bolus administration of intravenous contrast. CONTRAST:  100 cc Isovue COMPARISON:  None. FINDINGS: CTA CHEST FINDINGS Cardiovascular: No filling defects within the pulmonary arteries to suggest acute pulmonary embolism. No acute findings of the aorta or great vessels. No pericardial fluid. The pulmonary artery is dilated 45 mm. Calcification of the aortic arch. No pericardial effusion. Mediastinum/Nodes: No axillary supraclavicular adenopathy. No mediastinal hilar adenopathy. Lungs/Pleura: Bibasilar  atelectasis. No pulmonary infarction or infiltrate. Mild ground-glass opacities Musculoskeletal: No aggressive osseous lesion. Degenerate spurring of the thoracic spine Review of the MIP images confirms the above findings. CT ABDOMEN and PELVIS FINDINGS Hepatobiliary: No focal hepatic lesion. Gallstones in a nondistended gallbladder. Pancreas: Pancreas is normal. No ductal dilatation. No pancreatic inflammation. Spleen: Normal spleen Adrenals/urinary tract: Adrenal glands normal. Bilateral nonenhancing renal cysts. No obstructing ureteral obstruction. Bladder normal Stomach/Bowel: Stomach, small-bowel cecum normal. Colostomy in the LEFT lower quadrant without complication. There is peristomal hernia which contains a nonobstructed loop of small bowel. The small bowel entering the peristomal hernia measures approximately 4 cm (image 50/11). Excluded descending colon and rectum appear normal Vascular/Lymphatic: Calcified abdominal aorta. No aneurysm. Infrarenal IVC filter noted. No retroperitoneal lymphadenopathy. No pelvic lymphadenopathy Reproductive: Post hysterectomy anatomy Other: No free fluid. Musculoskeletal: No aggressive osseous lesion. IMPRESSION: Chest Impression: 1. No evidence acute pulmonary embolism. 2. Dilatation of pulmonary artery suggests pulmonary hypertension. 3. Mild ground-glass opacities suggests mild pulmonary edema. Abdomen / Pelvis Impression: 1. Peristomal hernia containing nonobstructed small bowel. 2. No evidence of bowel obstruction or inflammation. 3.  Aortic Atherosclerosis (ICD10-I70.0). Electronically Signed   By: Suzy Bouchard M.D.   On: 02/23/2018 21:48   Dg Abd 2 Views  Result Date: 02/23/2018 CLINICAL DATA:  Colostomy not draining.  Abdominal pain EXAM: ABDOMEN - 2 VIEW COMPARISON:  CT 01/08/2018 FINDINGS: No dilated large or small bowel. LEFT lower quadrant ostomy noted. IVC filter noted. Bilateral hip prosthetics IMPRESSION: No evidence of bowel obstruction.  Electronically Signed   By: Suzy Bouchard M.D.   On: 02/23/2018 20:06    Assessment:  The patient is a 84 y.o. woman with JAK2+polycythemia rubra vera(PV) on a phlebotomy program.  She is transitioning from hydroxyurea to [place]. She received P32in an attempt to manage her counts in 03/2015 andon 05/14/2017. Goal is to keep hematocrit < 42 and platelet count < 400,000.  Course has been complicated by a  cerebellar CVA, bilateral lower extremity DVTs, and bilateral pulmonary emboli.  She has an IVC filter in place.  She is was on Coumadin, but INR was difficult to control.  She developed a new DVT on 02/05/2018 while on Coumadin (subtherapeutic INR),  She is now on Eliquis.  Chest CT angiogram revealed no pulmonary embolism.  Symptomatically, she is feeling better.  Exam is stable.  Plan:   1.  Hematology:  Patient's counts have rebounded following her taper of hydroxyurea (2 gm/day to 500 mg/day).  Dose of hydroxyurea was changed on 02/17/2018 secondary to a precipitous drop in platelet count and WBC after being on Jakafi only 2 weeks.  Continue Jakafi at current dose.  Transiently increase hydroxyurea to 1 gm a day (ultimately her hydroxyurea will be tapered to off).  Follow-up counts weekly in the outpatient department.  Anticipate increasing dose of Jakafi in the outpatient department.    Continue Eliquis for DVT.  2.  Disposition:  Anticipate discharge to outpatient department tomorrow.  Follow-up in clinic on 04/25 or 04/26.  Anticipate further adjustment in hydroxyurea and Jakafi at that time.   Thank you for allowing me to participate in Yolanda Wells 's care.  I will follow her closely with you while hospitalized and after discharge in the outpatient department.   Yolanda Asal, MD  02/25/2018, 3:54 PM

## 2018-02-25 NOTE — Plan of Care (Signed)
Pt weaned off of oxygen. Plan to discharge tomorrow

## 2018-02-25 NOTE — Progress Notes (Signed)
[facility] [place] NAME: Yolanda Wells    MR#:  [id number]  DATE OF BIRTH:  1932/10/02  SUBJECTIVE:  CHIEF COMPLAINT:   Chief Complaint  Patient presents with  . Abdominal Pain  no compliants  REVIEW OF SYSTEMS:  CONSTITUTIONAL: No fever, fatigue or weakness.  EYES: No blurred or double vision.  EARS, NOSE, AND THROAT: No tinnitus or ear pain.  RESPIRATORY: No cough, shortness of breath, wheezing or hemoptysis.  CARDIOVASCULAR: No chest pain, orthopnea, edema.  GASTROINTESTINAL: No nausea, vomiting, diarrhea or abdominal pain.  GENITOURINARY: No dysuria, hematuria.  ENDOCRINE: No polyuria, nocturia,  HEMATOLOGY: No anemia, easy bruising or bleeding SKIN: No rash or lesion. MUSCULOSKELETAL: No joint pain or arthritis.   NEUROLOGIC: No tingling, numbness, weakness.  PSYCHIATRY: No anxiety or depression.   ROS  DRUG ALLERGIES:   Allergies  Allergen Reactions  . Sulfamethoxazole-Trimethoprim Nausea And Vomiting    VITALS:  Blood pressure 118/65, pulse 77, temperature 98.6 F (37 C), temperature source Oral, resp. rate 18, height 5\' 7"  (1.702 m), weight 96.6 kg (213 lb), SpO2 98 %.  PHYSICAL EXAMINATION:  GENERAL:  84 y.o.-year-old patient lying in the bed with no acute distress.  EYES: Pupils equal, round, reactive to light and accommodation. No scleral icterus. Extraocular muscles intact.  HEENT: Head atraumatic, normocephalic. Oropharynx and nasopharynx clear.  NECK:  Supple, no jugular venous distention. No thyroid enlargement, no tenderness.  LUNGS: Normal breath sounds bilaterally, no wheezing, rales,rhonchi or crepitation. No use of accessory muscles of respiration.  CARDIOVASCULAR: S1, S2 normal. No murmurs, rubs, or gallops.  ABDOMEN: Soft, nontender, nondistended. Bowel sounds present. No organomegaly or mass.  EXTREMITIES: No pedal edema, cyanosis, or clubbing.  NEUROLOGIC: Cranial nerves II through XII are intact.  Muscle strength 5/5 in all extremities. Sensation intact. Gait not checked.  PSYCHIATRIC: The patient is alert and oriented x 3.  SKIN: No obvious rash, lesion, or ulcer.   Physical Exam LABORATORY PANEL:   CBC Recent Labs  Lab 02/25/18 0827  WBC 21.2*  HGB 10.7*  HCT 34.5*  PLT 829*   ------------------------------------------------------------------------------------------------------------------  Chemistries  Recent Labs  Lab 02/23/18 1856 02/24/18 0456  NA 138 138  K 3.7 3.9  CL 104 104  CO2 29 27  GLUCOSE 112* 73  BUN 13 12  CREATININE 0.55 0.47  CALCIUM 8.7* 8.7*  AST 29  --   ALT 11*  --   ALKPHOS 73  --   BILITOT 0.6  --    ------------------------------------------------------------------------------------------------------------------  Cardiac Enzymes Recent Labs  Lab 02/23/18 1857  TROPONINI 0.03*   ------------------------------------------------------------------------------------------------------------------  RADIOLOGY:  Dg Chest 2 View  Result Date: 02/23/2018 CLINICAL DATA:  Pain and shortness of breath EXAM: CHEST - 2 VIEW COMPARISON:  February 05, 2018 FINDINGS: There is no edema or consolidation. Heart is mildly enlarged with pulmonary vascularity within normal limits. There is aortic atherosclerosis. No adenopathy. There is degenerative change in each shoulder. IMPRESSION: No edema or consolidation. Mild cardiac enlargement. Aortic atherosclerosis. Aortic Atherosclerosis (ICD10-I70.0). Electronically Signed   By: Lowella Grip III M.D.   On: 02/23/2018 20:11   Ct Angio Chest Pe W And/or Wo Contrast  Result Date: 02/23/2018 CLINICAL DATA:  DVT. Concern for pulmonary embolism. Long-term anticoagulation. Congestive heart failure. Polycythemia vera. Colostomy. Suprapubic pain. EXAM: CT ANGIOGRAPHY CHEST CT ABDOMEN AND PELVIS WITH CONTRAST TECHNIQUE: Multidetector CT imaging of the chest was performed using the standard protocol during bolus  administration of intravenous contrast.  Multiplanar CT image reconstructions and MIPs were obtained to evaluate the vascular anatomy. Multidetector CT imaging of the abdomen and pelvis was performed using the standard protocol during bolus administration of intravenous contrast. CONTRAST:  100 cc Isovue COMPARISON:  None. FINDINGS: CTA CHEST FINDINGS Cardiovascular: No filling defects within the pulmonary arteries to suggest acute pulmonary embolism. No acute findings of the aorta or great vessels. No pericardial fluid. The pulmonary artery is dilated 45 mm. Calcification of the aortic arch. No pericardial effusion. Mediastinum/Nodes: No axillary supraclavicular adenopathy. No mediastinal hilar adenopathy. Lungs/Pleura: Bibasilar atelectasis. No pulmonary infarction or infiltrate. Mild ground-glass opacities Musculoskeletal: No aggressive osseous lesion. Degenerate spurring of the thoracic spine Review of the MIP images confirms the above findings. CT ABDOMEN and PELVIS FINDINGS Hepatobiliary: No focal hepatic lesion. Gallstones in a nondistended gallbladder. Pancreas: Pancreas is normal. No ductal dilatation. No pancreatic inflammation. Spleen: Normal spleen Adrenals/urinary tract: Adrenal glands normal. Bilateral nonenhancing renal cysts. No obstructing ureteral obstruction. Bladder normal Stomach/Bowel: Stomach, small-bowel cecum normal. Colostomy in the LEFT lower quadrant without complication. There is peristomal hernia which contains a nonobstructed loop of small bowel. The small bowel entering the peristomal hernia measures approximately 4 cm (image 50/11). Excluded descending colon and rectum appear normal Vascular/Lymphatic: Calcified abdominal aorta. No aneurysm. Infrarenal IVC filter noted. No retroperitoneal lymphadenopathy. No pelvic lymphadenopathy Reproductive: Post hysterectomy anatomy Other: No free fluid. Musculoskeletal: No aggressive osseous lesion. IMPRESSION: Chest Impression: 1. No evidence  acute pulmonary embolism. 2. Dilatation of pulmonary artery suggests pulmonary hypertension. 3. Mild ground-glass opacities suggests mild pulmonary edema. Abdomen / Pelvis Impression: 1. Peristomal hernia containing nonobstructed small bowel. 2. No evidence of bowel obstruction or inflammation. 3.  Aortic Atherosclerosis (ICD10-I70.0). Electronically Signed   By: Suzy Bouchard M.D.   On: 02/23/2018 21:48   Ct Abdomen Pelvis W Contrast  Result Date: 02/23/2018 CLINICAL DATA:  DVT. Concern for pulmonary embolism. Long-term anticoagulation. Congestive heart failure. Polycythemia vera. Colostomy. Suprapubic pain. EXAM: CT ANGIOGRAPHY CHEST CT ABDOMEN AND PELVIS WITH CONTRAST TECHNIQUE: Multidetector CT imaging of the chest was performed using the standard protocol during bolus administration of intravenous contrast. Multiplanar CT image reconstructions and MIPs were obtained to evaluate the vascular anatomy. Multidetector CT imaging of the abdomen and pelvis was performed using the standard protocol during bolus administration of intravenous contrast. CONTRAST:  100 cc Isovue COMPARISON:  None. FINDINGS: CTA CHEST FINDINGS Cardiovascular: No filling defects within the pulmonary arteries to suggest acute pulmonary embolism. No acute findings of the aorta or great vessels. No pericardial fluid. The pulmonary artery is dilated 45 mm. Calcification of the aortic arch. No pericardial effusion. Mediastinum/Nodes: No axillary supraclavicular adenopathy. No mediastinal hilar adenopathy. Lungs/Pleura: Bibasilar atelectasis. No pulmonary infarction or infiltrate. Mild ground-glass opacities Musculoskeletal: No aggressive osseous lesion. Degenerate spurring of the thoracic spine Review of the MIP images confirms the above findings. CT ABDOMEN and PELVIS FINDINGS Hepatobiliary: No focal hepatic lesion. Gallstones in a nondistended gallbladder. Pancreas: Pancreas is normal. No ductal dilatation. No pancreatic inflammation.  Spleen: Normal spleen Adrenals/urinary tract: Adrenal glands normal. Bilateral nonenhancing renal cysts. No obstructing ureteral obstruction. Bladder normal Stomach/Bowel: Stomach, small-bowel cecum normal. Colostomy in the LEFT lower quadrant without complication. There is peristomal hernia which contains a nonobstructed loop of small bowel. The small bowel entering the peristomal hernia measures approximately 4 cm (image 50/11). Excluded descending colon and rectum appear normal Vascular/Lymphatic: Calcified abdominal aorta. No aneurysm. Infrarenal IVC filter noted. No retroperitoneal lymphadenopathy. No pelvic lymphadenopathy Reproductive: Post hysterectomy anatomy Other: No  free fluid. Musculoskeletal: No aggressive osseous lesion. IMPRESSION: Chest Impression: 1. No evidence acute pulmonary embolism. 2. Dilatation of pulmonary artery suggests pulmonary hypertension. 3. Mild ground-glass opacities suggests mild pulmonary edema. Abdomen / Pelvis Impression: 1. Peristomal hernia containing nonobstructed small bowel. 2. No evidence of bowel obstruction or inflammation. 3.  Aortic Atherosclerosis (ICD10-I70.0). Electronically Signed   By: Suzy Bouchard M.D.   On: 02/23/2018 21:48   Dg Abd 2 Views  Result Date: 02/23/2018 CLINICAL DATA:  Colostomy not draining.  Abdominal pain EXAM: ABDOMEN - 2 VIEW COMPARISON:  CT 01/08/2018 FINDINGS: No dilated large or small bowel. LEFT lower quadrant ostomy noted. IVC filter noted. Bilateral hip prosthetics IMPRESSION: No evidence of bowel obstruction. Electronically Signed   By: Suzy Bouchard M.D.   On: 02/23/2018 20:06    ASSESSMENT AND PLAN:  *Acute on chronic diastolic congestive heart failure exacerbation  Resolved  Suspect secondary to acute urinary tract infection Continue CHF protocol, change Lasix to po, continue aspirin, on Eliquis, wean O2 off, echocardiogram in December 2018 had shown diastolic dysfunction stage I/ejection fraction 55%, increase  activity, PT to eval/treat   *Acute hypoxic respiratory failure  Resolved Secondary to above  On minimal O2 hyphae hopefully wean off later this morning    *Acute recurrent UTI  Stable  Continue empiric meropenem and follow-up on cultures Noted history of multidrug-resistant Pseudomonas/frequent UTIs  *Chronic Polycythemia vera Consult hematology/oncology-given discussion with the patient's caregiver/granddaughter-platelet count has doubled and may require change in her medication regiment  *Chronic Colostomy Stable s/p resection in the past CT abdomen noted for parastomal hernia-no obstruction   *History of PE/DVT Stable Lower extremity Doppler noted for left DVT Continue Eliquis  Disposition to home in 1-2 days barring any complications   All the records are reviewed and case discussed with Care Management/Social Workerr. Management plans discussed with the patient, family and they are in agreement.  CODE STATUS: full  TOTAL TIME TAKING CARE OF THIS PATIENT: 35 minutes.     POSSIBLE D/C IN 1 DAYS, DEPENDING ON CLINICAL CONDITION.   Avel Peace Jizelle Conkey M.D on 02/25/2018   Between 7am to 6pm - Pager - [phone]  After 6pm go to [email or web address] - password [email or web address] [facility]  Office  [phone]  CC: Primary care physician; [name], MD  Note: This dictation was prepared with Dragon dictation along with smaller phrase technology. Any transcriptional errors that result from this process are unintentional.

## 2018-02-26 LAB — URINE CULTURE: Culture: >=100000 — AB

## 2018-02-26 LAB — BASIC METABOLIC PANEL
ANION GAP: 6 (ref 5–15)
BUN: 12 mg/dL (ref 6–20)
CHLORIDE: 100 mmol/L — AB (ref 101–111)
CO2: 32 mmol/L (ref 22–32)
CREATININE: 0.47 mg/dL (ref 0.44–1.00)
Calcium: 7.8 mg/dL — ABNORMAL LOW (ref 8.9–10.3)
GFR calc non Af Amer: >60 mL/min (ref >60)
Glucose, Bld: 93 mg/dL (ref 65–99)
Potassium: 3.4 mmol/L — ABNORMAL LOW (ref 3.5–5.1)
SODIUM: 138 mmol/L (ref 135–145)

## 2018-02-26 LAB — GLUCOSE, CAPILLARY: GLUCOSE-CAPILLARY: 89 mg/dL (ref 65–99)

## 2018-02-26 MED ORDER — CEFDINIR 300 MG PO CAPS
300.0000 mg | ORAL_CAPSULE | Freq: Two times a day (BID) | ORAL | Status: DC
  Administered 2018-02-26 (×2): 300 mg via ORAL
  Filled 2018-02-26 (×2): qty 1

## 2018-02-26 MED ORDER — CEFDINIR 300 MG PO CAPS: 300.0000 mg | ORAL_CAPSULE | Freq: Two times a day (BID) | ORAL | 0 refills | Status: AC

## 2018-02-26 NOTE — Discharge Instructions (Signed)
Pt to return home with granddaughter

## 2018-02-26 NOTE — Discharge Summary (Signed)
[facility] at [place] NAME: Sister Carbone    MR#:  [id number]  DATE OF BIRTH:  October 14, 1932  DATE OF ADMISSION:  02/23/2018 ADMITTING PHYSICIAN: Amelia Jo, MD  DATE OF DISCHARGE: 02/26/2018  PRIMARY CARE PHYSICIAN: [name], MD    ADMISSION DIAGNOSIS:  Pain in the abdomen [R10.9] Hypoxia [R09.02] Abdominal pain, unspecified abdominal location [R10.9] Urinary tract infection without hematuria, site unspecified [N39.0]  DISCHARGE DIAGNOSIS:  Active Problems:   Acute respiratory failure ([place])   SECONDARY DIAGNOSIS:   Past Medical History:  Diagnosis Date  . Acute deep vein thrombosis (DVT) of distal vein of left lower extremity ([place]) 10/03/2015  . Acute pulmonary embolism ([place]) 10/03/2015  . Anticoagulant long-term use   . Arthritis   . CHF (congestive heart failure) ([place])   . Chronic anticoagulation 10/03/2015  . Collagen vascular disease ([place])   . Colostomy in place [facility]) 10/04/2015  . Deep venous thrombosis ([facility])    right lower extremity  . Dependent edema   . Diverticulosis of intestine with bleeding 04/02/2015  . Glenohumeral arthritis 06/28/2012  . Hammertoe 09/02/2012  . History of bilateral hip replacements 09/02/2012  . History of hysterectomy   . Hypertension   . Hypokalemia   . Inability to ambulate due to hip 10/12/2015  . Mandibular dysfunction   . Onychomycosis 09/02/2012  . Osteoarthritis   . Polycythemia vera ([place])   . Presence of IVC filter 05/25/2015  . 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 Urinary incontinence in female 09/02/2016    HOSPITAL COURSE:   84 year old female with past medical history of previous DVT, polycythemia vera, CHF/chronic diastolic, history of previous CVA, essential hypertension who presented to the hospital due to shortness of breath.  1.  Acute respiratory failure with hypoxia-this was secondary to decompensated CHF. -Patient was diuresed with IV Lasix and her  hypoxia has improved and now resolved.  2.  CHF-acute on chronic diastolic dysfunction.  Initially patient was diuresed with IV Lasix.  She has improved.  She has been switched over to oral Lasix dose.  She is no longer hypoxic or complaining any shortness of breath and therefore being discharged back on her Lasix, Norvasc.   3. Hx of Previous DVT s/p IVC filter - pt. Will cont. Eliquis.   4. Hx of Polycythema VERA - seen by Oncology and cont. Hydrea, Jakafi and further care as per thm.  - follow up with Oncology next week.   5. Essential HTN - cont. Norvasc.   6. UTI - urine cultures + for E. Coli.  - pt. Will be discharged on 5 day course of Omnicef.    DISCHARGE CONDITIONS:   Stable.   CONSULTS OBTAINED:  Treatment Team:  Lequita Asal, MD  DRUG ALLERGIES:   Allergies  Allergen Reactions  . Sulfamethoxazole-Trimethoprim Nausea And Vomiting    DISCHARGE MEDICATIONS:   Allergies as of 02/26/2018      Reactions   Sulfamethoxazole-trimethoprim Nausea And Vomiting      Medication List    TAKE these medications   amLODipine 5 MG tablet Commonly known as:  NORVASC Take 5 mg by mouth daily.   aspirin EC 81 MG tablet Take 1 tablet (81 mg total) by mouth daily.   cefdinir 300 MG capsule Commonly known as:  OMNICEF Take 1 capsule (300 mg total) by mouth every 12 (twelve) hours for 5 days.   docusate sodium 100 MG capsule Commonly known as:  COLACE Take 100  mg by mouth 2 (two) times daily.   ELIQUIS STARTER PACK 5 MG Tabs Take as directed on package: start with two-5mg  tablets twice daily for 7 days. On day 8, switch to one-5mg  tablet twice daily.   ENSURE Take 1 Can by mouth 3 (three) times daily with meals.   folic acid 295 MCG tablet Commonly known as:  FOLVITE Take 400 mcg by mouth daily.   furosemide 20 MG tablet Commonly known as:  LASIX Take 20 mg by mouth daily.   hydroxyurea 500 MG capsule Commonly known as:  HYDREA TAKE 4 CAPSULES BY MOUTH  DAILY   ipratropium-albuterol 0.5-2.5 (3) MG/3ML Soln Commonly known as:  DUONEB Take 3 mLs by nebulization every 4 (four) hours as needed.   JAKAFI 10 MG tablet Generic drug:  ruxolitinib phosphate TAKE 1 TABLET (10 MG TOTAL) BY MOUTH 2 TIMES DAILY.   polyethylene glycol packet Commonly known as:  MIRALAX / GLYCOLAX Take 17 g by mouth daily.   potassium chloride 10 MEQ tablet Commonly known as:  K-DUR,KLOR-CON Take 10 mEq by mouth as needed. TAKE WITH LASIX PRN         DISCHARGE INSTRUCTIONS:   DIET:  Cardiac diet  DISCHARGE CONDITION:  Stable  ACTIVITY:  Activity as tolerated  OXYGEN:  Home Oxygen: No.   Oxygen Delivery: room air  DISCHARGE LOCATION:  Home with Home Health PT, RN, Aide.    If you experience worsening of your admission symptoms, develop shortness of breath, life threatening emergency, suicidal or homicidal thoughts you must seek medical attention immediately by calling 911 or calling your MD immediately  if symptoms less severe.  You Must read complete instructions/literature along with all the possible adverse reactions/side effects for all the Medicines you take and that have been prescribed to you. Take any new Medicines after you have completely understood and accpet all the possible adverse reactions/side effects.   Please note  You were cared for by a hospitalist during your hospital stay. If you have any questions about your discharge medications or the care you received while you were in the hospital after you are discharged, you can call the unit and asked to speak with the hospitalist on call if the hospitalist that took care of you is not available. Once you are discharged, your primary care physician will handle any further medical issues. Please note that NO REFILLS for any discharge medications will be authorized once you are discharged, as it is imperative that you return to your primary care physician (or establish a relationship with  a primary care physician if you do not have one) for your aftercare needs so that they can reassess your need for medications and monitor your lab values.     Today   No acute events overnight.  Patient's urine cultures were positive for E. coli but it is not multidrug-resistant.  Switched to [facility] today.  Will discharge home with home health services today.  VITAL SIGNS:  Blood pressure 112/67, pulse 90, temperature 98.2 F (36.8 C), temperature source Oral, resp. rate 17, height 5\' 7"  (1.702 m), weight 98.4 kg (217 lb), SpO2 94 %.  I/O:    Intake/Output Summary (Last 24 hours) at 02/26/2018 1446 Last data filed at 02/26/2018 1408 Gross per 24 hour  Intake 647 ml  Output 300 ml  Net 347 ml    PHYSICAL EXAMINATION:  GENERAL:  84 y.o.-year-old patient lying in the bed with no acute distress.  EYES: Pupils equal, round, reactive to  light and accommodation. No scleral icterus. Extraocular muscles intact.  HEENT: Head atraumatic, normocephalic. Oropharynx and nasopharynx clear.  NECK:  Supple, no jugular venous distention. No thyroid enlargement, no tenderness.  LUNGS: Normal breath sounds bilaterally, no wheezing, rales,rhonchi. No use of accessory muscles of respiration.  CARDIOVASCULAR: S1, S2 normal. No murmurs, rubs, or gallops.  ABDOMEN: Soft, non-tender, non-distended. Bowel sounds present. No organomegaly or mass.  EXTREMITIES: No pedal edema, cyanosis, or clubbing.  NEUROLOGIC: Cranial nerves II through XII are intact. No focal motor or sensory defecits b/l. Globally weak.  PSYCHIATRIC: The patient is alert and oriented x 3.  SKIN: No obvious rash, lesion, or ulcer.   DATA REVIEW:   CBC Recent Labs  Lab 02/25/18 1108  WBC 20.3*  HGB 11.0*  HCT 35.9  PLT 875*    Chemistries  Recent Labs  Lab 02/23/18 1856  02/26/18 0514  NA 138   < > 138  K 3.7   < > 3.4*  CL 104   < > 100*  CO2 29   < > 32  GLUCOSE 112*   < > 93  BUN 13   < > 12  CREATININE 0.55   < >  0.47  CALCIUM 8.7*   < > 7.8*  AST 29  --   --   ALT 11*  --   --   ALKPHOS 73  --   --   BILITOT 0.6  --   --    < > = values in this interval not displayed.    Cardiac Enzymes Recent Labs  Lab 02/23/18 1857  TROPONINI 0.03*    Microbiology Results  Results for orders placed or performed during the hospital encounter of 02/23/18  Urine culture     Status: Abnormal   Collection Time: 02/23/18  7:36 PM  Result Value Ref Range Status   Specimen Description   Final    URINE, RANDOM Performed at [facility], [address] [place], [place] [address]    Special Requests   Final    NONE Performed at [facility], [address] [place], [place] [address]    Culture >=100,000 COLONIES/mL ESCHERICHIA COLI (A)  Final   Report Status 02/26/2018 FINAL  Final   Organism ID, Bacteria ESCHERICHIA COLI (A)  Final      Susceptibility   Escherichia coli - MIC*    AMPICILLIN >=32 RESISTANT Resistant     CEFAZOLIN <=4 SENSITIVE Sensitive     CEFTRIAXONE <=1 SENSITIVE Sensitive     CIPROFLOXACIN >=4 RESISTANT Resistant     GENTAMICIN <=1 SENSITIVE Sensitive     IMIPENEM <=0.25 SENSITIVE Sensitive     NITROFURANTOIN <=16 SENSITIVE Sensitive     TRIMETH/SULFA <=20 SENSITIVE Sensitive     AMPICILLIN/SULBACTAM 4 SENSITIVE Sensitive     PIP/TAZO <=4 SENSITIVE Sensitive     Extended ESBL NEGATIVE Sensitive     * >=100,000 COLONIES/mL ESCHERICHIA COLI    RADIOLOGY:  No results found.    Management plans discussed with the patient, family and they are in agreement.  CODE STATUS:     Code Status Orders  (From admission, onward)        Start     Ordered   02/24/18 0347  Full code  Continuous     02/24/18 0346   TOTAL TIME TAKING CARE OF THIS PATIENT: 40 minutes.    Henreitta Leber M.D on 02/26/2018 at 2:46 PM  Between 7am to 6pm - Pager - [phone]  After 6pm go to [email or web address] - Proofreader  [employer]  [facility]   Office  [phone]  CC: Primary care physician; [name], MD

## 2018-02-26 NOTE — Clinical Social Work Note (Signed)
CSW received referral for SNF.  Case discussed with case manager and plan is to discharge home with home health.  CSW to sign off please re-consult if social work needs arise.  Rayson Rando R. Apollo Timothy, MSW, LCSWA [phone]  

## 2018-02-26 NOTE — Progress Notes (Signed)
Pt will be D/C today at 10 pm because her caregiver will not be at home until 10 pm tonight. The pt will be leaving via EMS.

## 2018-02-26 NOTE — Care Management Note (Signed)
Case Management Note  Patient Details  Name: Yolanda Wells MRN: [id number] Date of Birth: Oct 07, 1932   Patient to discharge home with resumption of home health orders. Patient lives at home with granddaughter and has 2 PCS aides in the home 7 days a week.  PCP [place].  Patient has hospital bed, WC, and RW.  Patient has been weaned to RA.  Open with [facility] home health. [name] with [facility] notified of discharge.  RNCM signing of.   Subjective/Objective:                    Action/Plan:   Expected Discharge Date:  02/26/18               Expected Discharge Plan:  [place]  In-House Referral:     Discharge planning Services  CM Consult  Post Acute Care Choice:  Home Health, Resumption of Svcs/PTA Provider Choice offered to:  Patient  DME Arranged:    DME Agency:     HH Arranged:  RN, PT, Nurse's Aide [place] Agency:  [place]  Status of Service:  Completed, signed off  If discussed at [place] of Stay Meetings, dates discussed:    Additional Comments:  Beverly Sessions, RN 02/26/2018, 2:48 PM

## 2018-02-26 NOTE — Progress Notes (Signed)
EMS called for discharge home to granddaughter. She is aware that they have been called.

## 2018-02-26 NOTE — Care Management Important Message (Signed)
Important Message  Patient Details  Name: Yolanda Wells MRN: [id number] Date of Birth: 1931-12-23   Medicare Important Message Given:  Yes    [name] Stan 02/26/2018, 12:11 PM

## 2018-02-26 NOTE — Progress Notes (Signed)
Pt discharged to home via EMS, her home medication is with pt in belongings bag. Notified granddaughter that EMS has arrived.

## 2018-02-26 NOTE — Evaluation (Signed)
Physical Therapy Evaluation Patient Details Name: Yolanda Wells MRN: [id number] DOB: [birth year] Today's Date: 02/26/2018   History of Present Illness  presented to ER secondary to abdominal pain, increased ostomy output; admitted with acute respiratory failure due to pulmonary edema.  PMH significant for R transmet amputation, COPD, CHF, cerebellar CVA, DVT/PE, colostomy  Clinical Impression  Upon evaluation, patient alert and oriented; follows all commands and demonstrates good effort with all mobility tasks.  Bilat UE < LE generally weak and deconditioned with mild ataxia noted all extremities.  R lateral trunk elongation with R shoulder elevation/upward scapular rotation; mild R lateral lean in sitting (but not overt LOB).  L ankle, knee with significant ROM deficits; requires assist to position and stabilize L foot in place with all functional activities.  Currently requiring min/mod assist with bed mobility; mod assist +2 for sit/stand and SPT with RW.  Decreased balance in A/P plane, requiring constant hands-on assist to prevent/recover posterior LOB.  Patient personal shoes (with orthotic devices) not available during session; anticipate improved performance in familiar environment with normal footwear. Would benefit from skilled PT to address above deficits and promote optimal return to PLOF; Recommend transition to [facility] (with max support services-HHOT, HHRN, HHaide, CSW) upon discharge from acute hospitalization.     Follow Up Recommendations Home health PT(HHOT, HHRN, HHaide, CSW)    Equipment Recommendations  (has all necessary equipment)    Recommendations for Other Services       Precautions / Restrictions Precautions Precautions: Fall Precaution Comments: L ostomy Restrictions Weight Bearing Restrictions: No      Mobility  Bed Mobility Overal bed mobility: Needs Assistance Bed Mobility: Supine to Sit;Sit to Supine     Supine to sit: Mod assist Sit to supine: Mod  assist      Transfers Overall transfer level: Needs assistance Equipment used: Rolling walker (2 wheeled) Transfers: Sit to/from [facility] Sit to Stand: Mod assist;+2 safety/equipment         General transfer comment: assist to position/block L LE; constant hands-on assist to prevent/recover posterior LOB.  LEs generally ataxic, inconsistent foot placement, stepping pattern.  Patient personal shoes (with orthotic devices) not available during session; anticipate improved performance in familiar environment with normal footwear.  Ambulation/Gait             General Gait Details: non-ambulatory at baseline  Stairs            Wheelchair Mobility    Modified Rankin (Stroke Patients Only)       Balance Overall balance assessment: Needs assistance Sitting-balance support: No upper extremity supported;Feet supported Sitting balance-Leahy Scale: Good     Standing balance support: Bilateral upper extremity supported Standing balance-Leahy Scale: Poor                               Pertinent Vitals/Pain Pain Assessment: No/denies pain    Home Living Family/patient expects to be discharged to:: Private residence Living Arrangements: Other relatives Available Help at Discharge: Family Type of Home: House Home Access: Ramped entrance     Home Layout: One level Home Equipment: Environmental consultant - 2 wheels;Wheelchair - manual Additional Comments: Caregiver M-F, 2-3 hours/day per patient report    Prior Function Level of Independence: Needs assistance         Comments: WC (manual) level as primary mobility; SPT between seating surfaces, assist from granddaughter/caregiver as needed. Non-ambulatory x2 years.  Denies recent falls.  Hand Dominance   Dominant Hand: Right    Extremity/Trunk Assessment   Upper Extremity Assessment Upper Extremity Assessment: Overall WFL for tasks assessed(grossly at least 4/5 throughout; mild ataxia noted  bilat)    Lower Extremity Assessment Lower Extremity Assessment: Generalized weakness(grossly 3-/5 throughout, L ankle lacking approx 8-10 degrees passive DF (tends to rest in position of inversion) with decreased L knee flexion (tolerating only 30-40 degrees))       Communication   Communication: No difficulties  Cognition Arousal/Alertness: Awake/alert Behavior During Therapy: WFL for tasks assessed/performed Overall Cognitive Status: Within Functional Limits for tasks assessed                                        General Comments      Exercises     Assessment/Plan    PT Assessment Patient needs continued PT services  PT Problem List Decreased strength;Decreased range of motion;Decreased activity tolerance;Decreased balance;Decreased mobility;Decreased coordination;Decreased knowledge of use of DME;Decreased safety awareness;Decreased knowledge of precautions;Cardiopulmonary status limiting activity       PT Treatment Interventions DME instruction;Functional mobility training;Therapeutic activities;Therapeutic exercise;Balance training;Patient/family education    PT Goals (Current goals can be found in the Care Plan section)  Acute Rehab PT Goals Patient Stated Goal: "to make sure I can do what I need to do so i can get out of here" PT Goal Formulation: With patient Time For Goal Achievement: 03/12/18 Potential to Achieve Goals: Fair    Frequency Min 2X/week   Barriers to discharge Decreased caregiver support      Co-evaluation               AM-PAC PT "6 Clicks" Daily Activity  Outcome Measure Difficulty turning over in bed (including adjusting bedclothes, sheets and blankets)?: Unable Difficulty moving from lying on back to sitting on the side of the bed? : Unable Difficulty sitting down on and standing up from a chair with arms (e.g., wheelchair, bedside commode, etc,.)?: Unable Help needed moving to and from a bed to chair (including a  wheelchair)?: A Lot Help needed walking in hospital room?: Total Help needed climbing 3-5 steps with a railing? : Total 6 Click Score: 7    End of Session Equipment Utilized During Treatment: Gait belt Activity Tolerance: Patient tolerated treatment well Patient left: in bed;with bed alarm set;with call bell/phone within reach Nurse Communication: Mobility status PT Visit Diagnosis: Muscle weakness (generalized) (M62.81);Unsteadiness on feet (R26.81)    Time: 5993-5701 PT Time Calculation (min) (ACUTE ONLY): 26 min   Charges:   PT Evaluation $PT Eval Moderate Complexity: 1 Mod PT Treatments $Therapeutic Activity: 8-22 mins   PT G Codes:       Porshia Blizzard H. Owens Shark, PT, DPT, NCS 02/26/18, 2:59 PM [phone]

## 2018-02-28 ENCOUNTER — Other Ambulatory Visit: Payer: Self-pay | Admitting: Hematology and Oncology

## 2018-02-28 DIAGNOSIS — D45 Polycythemia vera: Secondary | ICD-10-CM

## 2018-03-05 ENCOUNTER — Telehealth: Payer: Self-pay | Admitting: Hematology and Oncology

## 2018-03-05 ENCOUNTER — Other Ambulatory Visit: Payer: Self-pay | Admitting: Hematology and Oncology

## 2018-03-05 ENCOUNTER — Encounter: Payer: Self-pay | Admitting: Hematology and Oncology

## 2018-03-05 DIAGNOSIS — D45 Polycythemia vera: Secondary | ICD-10-CM

## 2018-03-05 MED ORDER — RUXOLITINIB PHOSPHATE 15 MG PO TABS: 15.0000 mg | ORAL_TABLET | Freq: Two times a day (BID) | ORAL | 1 refills | Status: DC

## 2018-03-05 NOTE — Telephone Encounter (Signed)
Re:  Blood counts and Jakafi  Patient had blood drawn through [facility] on 03/04/2018: Hematocrit 37.1, hemoglobin 11.8, platelets 838,000, WBC 25,000, ANC 20,600.  Patient currently on: Jakafi 10 mg BID Hydroxyurea 1 gm a day.  New Rx sent to [facility]: Jakafi 15 mg BID. Continue current hydroxyurea.  Continue every other week labs.  Consider next dose adjustment in 1 month.   Lequita Asal, MD

## 2018-03-08 ENCOUNTER — Other Ambulatory Visit: Payer: Self-pay | Admitting: Pharmacist

## 2018-03-08 DIAGNOSIS — D45 Polycythemia vera: Secondary | ICD-10-CM

## 2018-03-08 MED ORDER — RUXOLITINIB PHOSPHATE 15 MG PO TABS: 15.0000 mg | ORAL_TABLET | Freq: Two times a day (BID) | ORAL | 1 refills | Status: DC

## 2018-03-08 MED FILL — JAKAFI 15 MG TABLET: 15 | 30 days supply | Qty: 60 | Fill #0

## 2018-03-08 NOTE — Progress Notes (Signed)
Oral Chemotherapy Pharmacist Encounter   15 day of Shanon Brow was sent to [facility] on 03/05/18. Spoke with Dr. Mike Gip and the intention was to send a 30 day supply. Corrected prescription was resent to [facility].   Darl Pikes, PharmD, BCPS Hematology/Oncology Clinical Pharmacist [facility] [phone]  03/08/2018 10:55 AM

## 2018-03-09 NOTE — Progress Notes (Signed)
Patient ID: Yolanda Wells, female    DOB: 04-14-1932, 84 y.o.   MRN: [id number]  HPI  Yolanda Wells is a 84 y/o female with a history of HTN, stroke, DVT, pulmonary embolus, colostomy, hypokalemia and chronic heart failure.   Echo report from 10/16/17 reviewed and showed an EF of 55% along with calcified MV annulus.   Admitted 02/23/18 due to HF exacerbation. Initially needed IV lasix and then transitioned to oral diuretics. Oncology consult obtained due to patient's history of polycytherma. Discharged after 3 days. Was in the ED 02/05/18 due to peripheral edema. Ultrasound obtained which showed new left leg DVT with subtherapeutic INR. Warfain stopped and apixaban started. Released that day.   She presents today for her initial visit with a chief complaint of minimal shortness of breath upon moderate exertion. She says this has been chronic in nature for many years. She has associated fatigue, leg edema and difficulty sleeping along with this. She denies any abdominal distention, palpitations, chest pain or dizziness.   Past Medical History:  Diagnosis Date  . Acute deep vein thrombosis (DVT) of distal vein of left lower extremity ([place]) 10/03/2015  . Acute pulmonary embolism ([place]) 10/03/2015  . Anticoagulant long-term use   . Arthritis   . CHF (congestive heart failure) ([place])   . Chronic anticoagulation 10/03/2015  . Collagen vascular disease ([place])   . Colostomy in place [facility]) 10/04/2015  . Deep venous thrombosis ([facility])    right lower extremity  . Dependent edema   . Diverticulosis of intestine with bleeding 04/02/2015  . Glenohumeral arthritis 06/28/2012  . Hammertoe 09/02/2012  . History of bilateral hip replacements 09/02/2012  . History of hysterectomy   . Hypertension   . Hypokalemia   . Inability to ambulate due to hip 10/12/2015  . Mandibular dysfunction   . Onychomycosis 09/02/2012  . Osteoarthritis   . Polycythemia vera ([place])   . Presence of IVC filter 05/25/2015  . Stroke  ([place]) 03/26/2015   cerebellar  . Urinary incontinence in female 09/02/2016   Past Surgical History:  Procedure Laterality Date  . ABDOMINAL HYSTERECTOMY    . BLEPHAROPLASTY Right 03/2017   right upper eyelid  . CARDIAC CATHETERIZATION Right 04/03/2015   Procedure: CENTRAL LINE INSERTION;  Surgeon: Sherri Rad, MD;  Location: [facility] ORS;  Service: General;  Laterality: Right;  . COLECTOMY WITH COLOSTOMY CREATION/HARTMANN PROCEDURE N/A 04/03/2015   Procedure: COLECTOMY WITH COLOSTOMY CREATION/HARTMANN PROCEDURE;  Surgeon: Sherri Rad, MD;  Location: [facility] ORS;  Service: General;  Laterality: N/A;  . COLON SURGERY    . FOOT AMPUTATION     partial  . FOOT AMPUTATION Right   . JOINT REPLACEMENT     Left and Right Hip  . PERIPHERAL VASCULAR CATHETERIZATION N/A 04/02/2015   Procedure: Visceral Angiography;  Surgeon: Algernon Huxley, MD;  Location: [place] [facility];  Service: Cardiovascular;  Laterality: N/A;  . PERIPHERAL VASCULAR CATHETERIZATION N/A 04/02/2015   Procedure: Visceral Artery Intervention;  Surgeon: Algernon Huxley, MD;  Location: [place] [facility];  Service: Cardiovascular;  Laterality: N/A;  . PERIPHERAL VASCULAR CATHETERIZATION N/A 05/25/2015   Procedure: IVC Filter Insertion;  Surgeon: Katha Cabal, MD;  Location: [place] [facility] LAB;  Service: Cardiovascular;  Laterality: N/A;  . TOE AMPUTATION     right  . TOTAL HIP ARTHROPLASTY     Family History  Problem Relation Age of Onset  . Brain cancer Mother   . Heart attack Father   . COPD Father   .  Coronary artery disease Father   . Hypertension Unknown   . Arthritis-Osteo Unknown   . Diabetes Brother   . Arthritis Brother   . Osteosarcoma Son   . Bone cancer Son   . Arthritis Sister   . Arthritis Brother   . Hypertension Brother   . Coronary artery disease Brother    Social History   Tobacco Use  . Smoking status: Never Smoker  . Smokeless tobacco: Never Used  Substance Use Topics  . Alcohol use: No   Allergies   Allergen Reactions  . Sulfamethoxazole-Trimethoprim Nausea And Vomiting   Prior to Admission medications   Medication Sig Start Date End Date Taking? Authorizing Provider  amLODipine (NORVASC) 5 MG tablet Take 5 mg by mouth daily.   Yes [provider]  aspirin EC 81 MG tablet Take 1 tablet (81 mg total) by mouth daily. 09/16/15  Yes [name], MD  docusate sodium (COLACE) 100 MG capsule Take 100 mg by mouth 2 (two) times daily.    Yes [provider]  ELIQUIS STARTER PACK (ELIQUIS STARTER PACK) 5 MG TABS Take as directed on package: start with two-5mg  tablets twice daily for 7 days. On day 8, switch to one-5mg  tablet twice daily. 02/05/18  Yes Eula Listen, MD  ENSURE (ENSURE) Take 1 Can by mouth 3 (three) times daily with meals.   Yes [provider]  folic acid (FOLVITE) 299 MCG tablet Take 400 mcg by mouth daily.   Yes [provider]  furosemide (LASIX) 20 MG tablet Take 1 tablet (20 mg total) by mouth daily. 03/10/18  Yes Tyquon Near A, FNP  hydroxyurea (HYDREA) 500 MG capsule Take 2 capsules (1,000 mg total) by mouth daily. 03/03/18  Yes Karen Kitchens, NP  ipratropium-albuterol (DUONEB) 0.5-2.5 (3) MG/3ML SOLN Take 3 mLs by nebulization every 4 (four) hours as needed.   Yes [provider]  polyethylene glycol (MIRALAX / GLYCOLAX) packet Take 17 g by mouth daily.   Yes [provider]  potassium chloride (K-DUR,KLOR-CON) 10 MEQ tablet Take 10 mEq by mouth as needed. TAKE WITH LASIX PRN   Yes [provider]  ruxolitinib phosphate (JAKAFI) 15 MG tablet Take 1 tablet (15 mg total) by mouth 2 (two) times daily. 03/08/18  Yes Lequita Asal, MD   Review of Systems  Constitutional: Positive for fatigue (easily). Negative for appetite change.  HENT: Positive for sneezing. Negative for congestion, postnasal drip and sore throat.   Eyes: Negative.   Respiratory: Positive for shortness of breath (with moderate  exertion). Negative for chest tightness.   Cardiovascular: Positive for leg swelling (not much). Negative for chest pain and palpitations.  Gastrointestinal: Negative for abdominal distention and abdominal pain.  Endocrine: Negative.   Genitourinary: Negative.   Musculoskeletal: Negative for back pain and neck pain.  Allergic/Immunologic: Negative.   Neurological: Negative for dizziness and light-headedness.  Hematological: Negative for adenopathy. Does not bruise/bleed easily.  Psychiatric/Behavioral: Positive for sleep disturbance (waking up often). Negative for dysphoric mood. The patient is not nervous/anxious.     Vitals:   03/10/18 1215  BP: 135/78  Pulse: 80  Resp: 18  SpO2: 99%  Weight: 193 lb (87.5 kg)  Height: 5\' 7"  (1.702 m)   Wt Readings from Last 3 Encounters:  03/10/18 193 lb (87.5 kg)  02/26/18 217 lb (98.4 kg)  12/29/17 200 lb (90.7 kg)   Lab Results  Component Value Date   CREATININE 0.47 02/26/2018   CREATININE 0.47 02/24/2018  CREATININE 0.55 02/23/2018    Physical Exam  Constitutional: She is oriented to person, place, and time. She appears well-developed and well-nourished.  HENT:  Head: Normocephalic and atraumatic.  Neck: Normal range of motion. Neck supple. No JVD present.  Cardiovascular: Normal rate and regular rhythm.  Pulmonary/Chest: Effort normal. She has no wheezes. She has no rales.  Abdominal: Soft. She exhibits no distension.  Musculoskeletal:       Right lower leg: She exhibits no edema.       Left lower leg: She exhibits no edema.  Neurological: She is alert and oriented to person, place, and time.  Skin: Skin is warm and dry.  Psychiatric: She has a normal mood and affect. Her behavior is normal.  Nursing note and vitals reviewed.  Assessment & Plan:  1: Chronic heart failure with preserved ejection fraction- - NYHA class II - euvolemic today - unable to safely weigh due to previous midfoot amputation years ago and has been  wheelchair bound since - granddaughter gives furosemide as needed based on edema - rarely adds salt to her food. Granddaughter does the shopping and tries to get low sodium items. Reviewed the importance of closely following a 2000mg  sodium diet and written dietary information was given to her about this - PT coming twice weekly - home health nurse coming weekly - BNP 02/23/18 was 84.0  2: HTN- - BP looks good today - saw PCP ([facility]) 03/09/18 - BMP on 02/26/18 reviewed and showed sodium 138, potassium 3.4 and GFR >60  3: DVT- - IVC filter in place - continues on apixaban  4: Polycythemia VERA- - saw oncologist Mike Gip) 03/05/18  Patient did not bring her medications nor a list. Each medication was verbally reviewed with the patient and she was encouraged to bring the bottles to every visit to confirm accuracy of list.  Return in 1 month or sooner for any questions/problems before then.

## 2018-03-10 ENCOUNTER — Ambulatory Visit: Payer: Medicare Other | Attending: Family | Admitting: Family

## 2018-03-10 ENCOUNTER — Encounter: Payer: Self-pay | Admitting: Family

## 2018-03-10 VITALS — BP 135/78 | HR 80 | Resp 18 | Ht 67.0 in | Wt 193.0 lb

## 2018-03-10 DIAGNOSIS — Z7982 Long term (current) use of aspirin: Secondary | ICD-10-CM | POA: Insufficient documentation

## 2018-03-10 DIAGNOSIS — D45 Polycythemia vera: Secondary | ICD-10-CM

## 2018-03-10 DIAGNOSIS — M1990 Unspecified osteoarthritis, unspecified site: Secondary | ICD-10-CM | POA: Insufficient documentation

## 2018-03-10 DIAGNOSIS — I82509 Chronic embolism and thrombosis of unspecified deep veins of unspecified lower extremity: Secondary | ICD-10-CM

## 2018-03-10 DIAGNOSIS — I509 Heart failure, unspecified: Secondary | ICD-10-CM | POA: Insufficient documentation

## 2018-03-10 DIAGNOSIS — I5032 Chronic diastolic (congestive) heart failure: Secondary | ICD-10-CM

## 2018-03-10 DIAGNOSIS — I110 Hypertensive heart disease with heart failure: Secondary | ICD-10-CM | POA: Insufficient documentation

## 2018-03-10 DIAGNOSIS — Z8673 Personal history of transient ischemic attack (TIA), and cerebral infarction without residual deficits: Secondary | ICD-10-CM | POA: Diagnosis not present

## 2018-03-10 DIAGNOSIS — Z86711 Personal history of pulmonary embolism: Secondary | ICD-10-CM | POA: Insufficient documentation

## 2018-03-10 DIAGNOSIS — Z96643 Presence of artificial hip joint, bilateral: Secondary | ICD-10-CM | POA: Diagnosis not present

## 2018-03-10 DIAGNOSIS — E876 Hypokalemia: Secondary | ICD-10-CM | POA: Insufficient documentation

## 2018-03-10 DIAGNOSIS — Z993 Dependence on wheelchair: Secondary | ICD-10-CM | POA: Diagnosis not present

## 2018-03-10 DIAGNOSIS — Z86718 Personal history of other venous thrombosis and embolism: Secondary | ICD-10-CM | POA: Diagnosis not present

## 2018-03-10 DIAGNOSIS — Z933 Colostomy status: Secondary | ICD-10-CM | POA: Diagnosis not present

## 2018-03-10 DIAGNOSIS — Z7901 Long term (current) use of anticoagulants: Secondary | ICD-10-CM | POA: Insufficient documentation

## 2018-03-10 DIAGNOSIS — Z95828 Presence of other vascular implants and grafts: Secondary | ICD-10-CM | POA: Diagnosis not present

## 2018-03-10 DIAGNOSIS — Z79899 Other long term (current) drug therapy: Secondary | ICD-10-CM | POA: Insufficient documentation

## 2018-03-10 DIAGNOSIS — I10 Essential (primary) hypertension: Secondary | ICD-10-CM

## 2018-03-10 DIAGNOSIS — Z89439 Acquired absence of unspecified foot: Secondary | ICD-10-CM | POA: Insufficient documentation

## 2018-03-10 MED ORDER — FUROSEMIDE 20 MG PO TABS: 20.0000 mg | ORAL_TABLET | Freq: Every day | ORAL | 6 refills | Status: DC

## 2018-03-17 ENCOUNTER — Encounter: Payer: Self-pay | Admitting: Hematology and Oncology

## 2018-03-17 ENCOUNTER — Inpatient Hospital Stay: Payer: Medicare Other | Attending: Hematology and Oncology | Admitting: Hematology and Oncology

## 2018-03-17 ENCOUNTER — Inpatient Hospital Stay: Payer: Medicare Other

## 2018-03-17 VITALS — BP 158/90 | HR 69 | Temp 96.3°F | Resp 20

## 2018-03-17 DIAGNOSIS — Z8673 Personal history of transient ischemic attack (TIA), and cerebral infarction without residual deficits: Secondary | ICD-10-CM | POA: Insufficient documentation

## 2018-03-17 DIAGNOSIS — R0602 Shortness of breath: Secondary | ICD-10-CM | POA: Diagnosis not present

## 2018-03-17 DIAGNOSIS — D471 Chronic myeloproliferative disease: Secondary | ICD-10-CM | POA: Diagnosis not present

## 2018-03-17 DIAGNOSIS — Z7901 Long term (current) use of anticoagulants: Secondary | ICD-10-CM | POA: Diagnosis not present

## 2018-03-17 DIAGNOSIS — Z86711 Personal history of pulmonary embolism: Secondary | ICD-10-CM | POA: Insufficient documentation

## 2018-03-17 DIAGNOSIS — Z7189 Other specified counseling: Secondary | ICD-10-CM

## 2018-03-17 DIAGNOSIS — D45 Polycythemia vera: Secondary | ICD-10-CM

## 2018-03-17 DIAGNOSIS — Z7982 Long term (current) use of aspirin: Secondary | ICD-10-CM | POA: Insufficient documentation

## 2018-03-17 DIAGNOSIS — Z86718 Personal history of other venous thrombosis and embolism: Secondary | ICD-10-CM | POA: Diagnosis not present

## 2018-03-17 LAB — CBC WITH DIFFERENTIAL/PLATELET
Basophils Absolute: 0.1 10*3/uL (ref 0–0.1)
Basophils Relative: 1 %
Eosinophils Absolute: 0.1 10*3/uL (ref 0–0.7)
Eosinophils Relative: 1 %
HCT: 34.7 % — ABNORMAL LOW (ref 35.0–47.0)
Hemoglobin: 11.2 g/dL — ABNORMAL LOW (ref 12.0–16.0)
Lymphocytes Relative: 19 %
Lymphs Abs: 1.8 10*3/uL (ref 1.0–3.6)
MCH: 26.7 pg (ref 26.0–34.0)
MCHC: 32.4 g/dL (ref 32.0–36.0)
MCV: 82.5 fL (ref 80.0–100.0)
Monocytes Absolute: 0.4 10*3/uL (ref 0.2–0.9)
Monocytes Relative: 4 %
Neutro Abs: 7.3 10*3/uL — ABNORMAL HIGH (ref 1.4–6.5)
Neutrophils Relative %: 75 %
Platelets: 374 10*3/uL (ref 150–440)
RBC: 4.21 MIL/uL (ref 3.80–5.20)
RDW: 35.3 % — ABNORMAL HIGH (ref 11.5–14.5)
WBC: 9.7 10*3/uL (ref 3.6–11.0)

## 2018-03-17 LAB — COMPREHENSIVE METABOLIC PANEL
ALT: 10 U/L — ABNORMAL LOW (ref 14–54)
AST: 18 U/L (ref 15–41)
Albumin: 3.1 g/dL — ABNORMAL LOW (ref 3.5–5.0)
Alkaline Phosphatase: 69 U/L (ref 38–126)
Anion gap: 8 (ref 5–15)
BUN: 16 mg/dL (ref 6–20)
CO2: 26 mmol/L (ref 22–32)
Calcium: 9 mg/dL (ref 8.9–10.3)
Chloride: 104 mmol/L (ref 101–111)
Creatinine, Ser: 0.56 mg/dL (ref 0.44–1.00)
GFR calc Af Amer: >60 mL/min (ref >60)
GFR calc non Af Amer: >60 mL/min (ref >60)
Glucose, Bld: 110 mg/dL — ABNORMAL HIGH (ref 65–99)
Potassium: 4.1 mmol/L (ref 3.5–5.1)
Sodium: 138 mmol/L (ref 135–145)
Total Bilirubin: 0.7 mg/dL (ref 0.3–1.2)
Total Protein: 7.4 g/dL (ref 6.5–8.1)

## 2018-03-17 MED ORDER — APIXABAN 5 MG PO TABS: 5.0000 mg | ORAL_TABLET | Freq: Two times a day (BID) | ORAL | 1 refills | Status: DC

## 2018-03-17 NOTE — Progress Notes (Signed)
[facility] day:  03/17/2018   Chief Complaint: Yolanda Wells is a 84 y.o. 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  HPI:   The patient was last seen in the medical oncology clinic on 02/17/2018.  At that time, she felt "fine". She was taking hydroxyurea 4 pills/day. She continued on apixaban.  Exam was stable.  Hematocrit was 39.9  Platelet count was 582,000 (previoulsy 1,245,000). WBC was 2800 with an ANC of 1500.  Hydroxyurea was decreased to 1 pill/day.  She continued Jakafi 10 mg BID.  She was admitted to [facility] from 02/23/2018 - 02/26/2018 with abdominal pain and hypoxia.  She was felt to have decompensated CHF.  She was diuresed and her hypoxia resolved.  Urine culture was + E coli.  She was discharged on a 5 day course of Omnicef.  CBC on 02/25/2018 revealed a hematocrit of 35.9, hemoglobin 11.0, MCV 81.3, platelets 875,000, WBC 20,300 with an ANC of 16,900.  During the interim, patient has been doing well overall. She bruises easily on apixaban. Patient continues on her Hydrea 500 mg daily. She started Jakafi 15 mg BID on 03/10/2018. She has no acute concerns. Patient is eating well.  No obvious weight loss noted.   Patient denies pain in the clinic today.    Past Medical History:  Diagnosis Date  . Acute deep vein thrombosis (DVT) of distal vein of left lower extremity ([place]) 10/03/2015  . Acute pulmonary embolism ([place]) 10/03/2015  . Anticoagulant long-term use   . Arthritis   . CHF (congestive heart failure) ([place])   . Chronic anticoagulation 10/03/2015  . Collagen vascular disease ([place])   . Colostomy in place [facility]) 10/04/2015  . Deep venous thrombosis ([facility])    right lower extremity  . Dependent edema   . Diverticulosis of intestine with bleeding 04/02/2015  . Glenohumeral arthritis 06/28/2012  . Hammertoe 09/02/2012  . History of bilateral hip replacements 09/02/2012  .  History of hysterectomy   . Hypertension   . Hypokalemia   . Inability to ambulate due to hip 10/12/2015  . Mandibular dysfunction   . Onychomycosis 09/02/2012  . Osteoarthritis   . Polycythemia vera ([place])   . Presence of IVC filter 05/25/2015  . 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 Urinary incontinence in female 09/02/2016    Past Surgical History:  Procedure Laterality Date  . ABDOMINAL HYSTERECTOMY    . BLEPHAROPLASTY Right 03/2017   right upper eyelid  . CARDIAC CATHETERIZATION Right 04/03/2015   Procedure: CENTRAL LINE INSERTION;  Surgeon: Sherri Rad, MD;  Location: [facility] ORS;  Service: General;  Laterality: Right;  . COLECTOMY WITH COLOSTOMY CREATION/HARTMANN PROCEDURE N/A 04/03/2015   Procedure: COLECTOMY WITH COLOSTOMY CREATION/HARTMANN PROCEDURE;  Surgeon: Sherri Rad, MD;  Location: [facility] ORS;  Service: General;  Laterality: N/A;  . COLON SURGERY    . FOOT AMPUTATION     partial  . FOOT AMPUTATION Right   . JOINT REPLACEMENT     Left and Right Hip  . PERIPHERAL VASCULAR CATHETERIZATION N/A 04/02/2015   Procedure: Visceral Angiography;  Surgeon: Algernon Huxley, MD;  Location: [place] [facility];  Service: Cardiovascular;  Laterality: N/A;  . PERIPHERAL VASCULAR CATHETERIZATION N/A 04/02/2015   Procedure: Visceral Artery Intervention;  Surgeon: Algernon Huxley, MD;  Location: [place] [facility];  Service: Cardiovascular;  Laterality: N/A;  . PERIPHERAL VASCULAR CATHETERIZATION N/A 05/25/2015   Procedure:  IVC Filter Insertion;  Surgeon: Katha Cabal, MD;  Location: [place] [facility];  Service: Cardiovascular;  Laterality: N/A;  . TOE AMPUTATION     right  . TOTAL HIP ARTHROPLASTY      Family History  Problem Relation Age of Onset  . Brain cancer Mother   . Heart attack Father   . COPD Father   . Coronary artery disease Father   . Hypertension Unknown   . Arthritis-Osteo Unknown   . Diabetes Brother   . Arthritis Brother   . Osteosarcoma Son   . Bone cancer Son    . Arthritis Sister   . Arthritis Brother   . Hypertension Brother   . Coronary artery disease Brother     Social History:  reports that she has never smoked. She has never used smokeless tobacco. She reports that she does not drink alcohol or use drugs.  She lives alone on [address] at [facility] (independent living).  She moved out of [facility].  She has been living with her grand daughter since 02/06/2017.  Her grand-daughter visits regularly [name] [phone]).  The patient is accompanied by her grand daughter, [name], today.   Allergies:  Allergies  Allergen Reactions  . Sulfamethoxazole-Trimethoprim Nausea And Vomiting    Current Medications: Current Outpatient Medications  Medication Sig Dispense Refill  . amLODipine (NORVASC) 5 MG tablet Take 5 mg by mouth daily.    [name] aspirin EC 81 MG tablet Take 1 tablet (81 mg total) by mouth daily. 30 tablet 2  . docusate sodium (COLACE) 100 MG capsule Take 100 mg by mouth 2 (two) times daily.     [name] ELIQUIS STARTER PACK (ELIQUIS STARTER PACK) 5 MG TABS Take as directed on package: start with two-47m tablets twice daily for 7 days. On day 8, switch to one-587mtablet twice daily. 1 each 0  . ENSURE (ENSURE) Take 1 Can by mouth 3 (three) times daily with meals.    . folic acid (FOLVITE) 40166CG tablet Take 400 mcg by mouth daily.    . furosemide (LASIX) 20 MG tablet Take 1 tablet (20 mg total) by mouth daily. 30 tablet 6  . hydroxyurea (HYDREA) 500 MG capsule Take 2 capsules (1,000 mg total) by mouth daily. 120 capsule 0  . ipratropium-albuterol (DUONEB) 0.5-2.5 (3) MG/3ML SOLN Take 3 mLs by nebulization every 4 (four) hours as needed.    . polyethylene glycol (MIRALAX / GLYCOLAX) packet Take 17 g by mouth daily.    . potassium chloride (K-DUR,KLOR-CON) 10 MEQ tablet Take 10 mEq by mouth as needed. TAKE WITH LASIX PRN    . ruxolitinib phosphate (JAKAFI) 15 MG tablet Take 1 tablet (15 mg total) by mouth 2 (two) times daily. 60  tablet 1   No current facility-administered medications for this visit.     Review of Systems  Constitutional: Negative for diaphoresis, fever, malaise/fatigue and weight loss.       Feels fine  HENT: Negative.   Eyes: Negative for pain and redness.       Decreased vision in right eye s/p embolic events.  Respiratory: Positive for shortness of breath. Negative for cough, hemoptysis and sputum production.   Cardiovascular: Negative for chest pain, palpitations, orthopnea, leg swelling and PND.  Gastrointestinal: Negative for abdominal pain, blood in stool, constipation, diarrhea, melena, nausea and vomiting.       Colostomy  Genitourinary: Negative for dysuria, frequency, hematuria and urgency.       Incontinent; wears briefs  Musculoskeletal: Negative for back pain, falls, joint pain and myalgias.  Skin: Negative for itching and rash.  Neurological: Negative for dizziness, tremors, weakness and headaches.  Endo/Heme/Allergies: Bruises/bleeds easily (on apixaban).  Psychiatric/Behavioral: Negative for depression, memory loss and suicidal ideas. The patient is not nervous/anxious and does not have insomnia.   All other systems reviewed and are negative.  PERFORMANCE STATUS (ECOG):  3  Physical Exam: Blood pressure (!) 158/90, pulse 69, temperature (!) 96.3 F (35.7 C), temperature source Tympanic, resp. rate 20, SpO2 95 %. GENERAL:  Elderly woman sitting comfortably in a wheelchair in the exam room in no acute distress. MENTAL STATUS:  Alert and oriented to person, place and time. HEAD:  Pearline Cables hair.  Normocephalic, atraumatic, face symmetric, no Cushingoid features. EYES:  Brown eyes.  Pupils equal round and reactive to light and accomodation.  No conjunctivitis or scleral icterus. ENT:  Oropharynx clear without lesion.  Tongue normal. Mucous membranes moist.  RESPIRATORY:  Clear to auscultation without rales, wheezes or rhonchi. CARDIOVASCULAR:  Regular rate and rhythm without  murmur, rub or gallop. ABDOMEN:  Left sided ostomy.  Soft, non-tender, with active bowel sounds, and no hepatosplenomegaly.  No masses. SKIN:  Scattered bruises.  No rashes, ulcers or lesions. EXTREMITIES: Chronic lower extremity edema.  No skin discoloration or tenderness.  No palpable cords. LYMPH NODES: No palpable cervical, supraclavicular, axillary or inguinal adenopathy  NEUROLOGICAL: Unremarkable. PSYCH:  Appropriate.   Appointment on 03/17/2018  Component Date Value Ref Range Status  . Sodium 03/17/2018 138  135 - 145 mmol/L Final  . Potassium 03/17/2018 4.1  3.5 - 5.1 mmol/L Final  . Chloride 03/17/2018 104  101 - 111 mmol/L Final  . CO2 03/17/2018 26  22 - 32 mmol/L Final  . Glucose, Bld 03/17/2018 110* 65 - 99 mg/dL Final  . BUN 03/17/2018 16  6 - 20 mg/dL Final  . Creatinine, Ser 03/17/2018 0.56  0.44 - 1.00 mg/dL Final  . Calcium 03/17/2018 9.0  8.9 - 10.3 mg/dL Final  . Total Protein 03/17/2018 7.4  6.5 - 8.1 g/dL Final  . Albumin 03/17/2018 3.1* 3.5 - 5.0 g/dL Final  . AST 03/17/2018 18  15 - 41 U/L Final  . ALT 03/17/2018 10* 14 - 54 U/L Final  . Alkaline Phosphatase 03/17/2018 69  38 - 126 U/L Final  . Total Bilirubin 03/17/2018 0.7  0.3 - 1.2 mg/dL Final  . GFR calc non Af Amer 03/17/2018 >60  >60 mL/min Final  . GFR calc Af Amer 03/17/2018 >60  >60 mL/min Final   Comment: (NOTE) The eGFR has been calculated using the CKD EPI equation. This calculation has not been validated in all clinical situations. eGFR's persistently <60 mL/min signify possible Chronic Kidney Disease.   Georgiann Hahn gap 03/17/2018 8  5 - 15 Final   Performed at [facility], [address], [place], [place] [address]  . WBC 03/17/2018 9.7  3.6 - 11.0 K/uL Final  . RBC 03/17/2018 4.21  3.80 - 5.20 MIL/uL Final  . Hemoglobin 03/17/2018 11.2* 12.0 - 16.0 g/dL Final  . HCT 03/17/2018 34.7* 35.0 - 47.0 % Final  . MCV 03/17/2018 82.5  80.0 - 100.0 fL Final  . MCH 03/17/2018 26.7  26.0  - 34.0 pg Final  . MCHC 03/17/2018 32.4  32.0 - 36.0 g/dL Final  . RDW 03/17/2018 35.3* 11.5 - 14.5 % Final  . Platelets 03/17/2018 374  150 - 440 K/uL Final  . Neutrophils Relative %  03/17/2018 75  % Final  . Neutro Abs 03/17/2018 7.3* 1.4 - 6.5 K/uL Final  . Lymphocytes Relative 03/17/2018 19  % Final  . Lymphs Abs 03/17/2018 1.8  1.0 - 3.6 K/uL Final  . Monocytes Relative 03/17/2018 4  % Final  . Monocytes Absolute 03/17/2018 0.4  0.2 - 0.9 K/uL Final  . Eosinophils Relative 03/17/2018 1  % Final  . Eosinophils Absolute 03/17/2018 0.1  0 - 0.7 K/uL Final  . Basophils Relative 03/17/2018 1  % Final  . Basophils Absolute 03/17/2018 0.1  0 - 0.1 K/uL Final   Performed at [facility], [address] [place], [place] [address]    Assessment:  Jodene Polyak is a 84 y.o. female African-American woman with JAK2+ polycythemia rubra vera (PV) previously on a phlebotomy program and hydroxyurea. She received P32 in an attempt to manage her counts in 03/2015 and on 05/14/2017.  Bone marrow on 01/07/2018 revealed a hypercellular bone marrow with a myeloproliferative neoplasm.  There was mild to moderate reticulin fibrosis.  Flow cytometry revealed no increased blasts.  Cytogenetics revealed 75, XX, der(15)t(1;15)(q11;p11).  The der 15t(1;15) has been associated with polycythemia, AML, and MDS.  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 She was diagnosed with bilateral lower extremity DVTs on 05/18/2015 and bilateral pulmonary emboli on 05/24/2015. She underwent IVC filter placement on 05/25/2015.  She has been on a fluctuating dose of Coumadin secondary to unstable INR.  She is on a baby aspirin.  She developed progressive erythrocytosis, thrombocytosis, and leukocytosis.  She underwent phlebotomy for a hematocrit of 55.4 on 09/23/2015.  Hematocrit decreased to 50.0, but has again  increased after initiation of oral iron.  Platelet count has increased from 1.1 million to 1.4 million.  White count increased from 23,000 - 28,000 to 31,800.  She was admitted on 08/25/2016 with loss of vision in her right eye.  CBC on 08/25/2016 revealed a hematocrit of 40.0, hemoglobin 13.5, MCV 91, platelets 842,000, WBC 4900.  INR was 2.59.  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  MRI and MRA showed no acute changes.  She has been back on hydroxyurea since 10/02/2015.  Initial dose was 1000 mg a day.  She is currently taking 4 pills/day (total weekly dose: 28 pills).   She began Jakafi 10 mg BID on 02/02/2018 (increased to 15 mg BID on 03/10/2018).  She requires periodic phlebotomies (goal hematocrit <= 42).  Platelet count remains elevated (secondary to PV and likely some component of iron deficiency).  Goal platelet count is 400,000.  She was on Coumadin (discontinued secondary to clot and subtherapeutic INR).  She began Eliquis on 02/05/2018.  She was admitted to [facility] for 2.5 weeks with pneumonia then returned 07/03/2017 - 07/12/2017 with CHF.  She was admitted to [facility] from 09/26/2017 - 10/09/2017 with symptoms of heart failure and an E coli UTI.  She was treated with Lasix and ceftriaxone.  Echo revealed an EF of 60%.  CXR suggested pulmonary artery hypertension.    She was admitted to [facility] from 02/23/2018 - 02/26/2018 with abdominal pain and hypoxia.  She was felt to have decompensated CHF.  She was diuresed and her hypoxia resolved.  Urine culture was + E coli.  She was discharged on a 5 day course of Omnicef.  Symptomatically, she feels "fine".  She has no acute complains. She has intermittent episodes of shortness  of breath at rest.  Notes that "menthol cough drops" help.  Patient bruises easily on apixaban. Exam is stable.   Plan: 1.  Labs today:  CBC with diff, CMP. 2.  Discuss recent [name] admission. 3.  Continue weekly labs  with home health. Will consider stopping Hydrea next week.  4.  Continue Eliquis as prescribed (5 mg BID).  5.  Continue Jakafi as prescribed. Discussed need for weekly labs to monitor for neutropenia. Review neutropenic precautions. Anticipate eventual discontinuation of hydroxyurea and dose adjustment of Jakafi to reach goal counts (platelet count < 400,000, HCT < 42, ANC adequate). 6.  RTC in 1 month for MD assessment and labs (CBC with diff, CMP).   Honor Loh, NP  03/17/2018, 11:39 AM   I saw and evaluated the patient, participating in the key portions of the service and reviewing pertinent diagnostic studies and records.  I reviewed the nurse practitioner's note and agree with the findings and the plan.  The assessment and plan were discussed with the patient.  Multiple questions were asked by the patient and answered.   Nolon Stalls, MD 03/17/2018, 11:39 AM

## 2018-03-17 NOTE — Progress Notes (Signed)
Patient offers no complaints today.  Granddaughter is asking for prescription for 5 mg Eliquis.  ED wrote for starter kit. 

## 2018-03-29 ENCOUNTER — Telehealth: Payer: Self-pay | Admitting: *Deleted

## 2018-03-29 NOTE — Telephone Encounter (Signed)
Called patient's granddaughter, [name] to inform her that patient can stop Hydrea. Verbalized understanding.

## 2018-03-31 ENCOUNTER — Telehealth: Payer: Self-pay | Admitting: *Deleted

## 2018-03-31 NOTE — Telephone Encounter (Signed)
Home Health nurse called asking if we want her to draw a weekly CMP with the CBC they are drawing on patient. Please return her call [phone]

## 2018-03-31 NOTE — Telephone Encounter (Signed)
Call returned to [place] and VO given that CMP is only once a month

## 2018-03-31 NOTE — Telephone Encounter (Signed)
  CMP is just drawn monthly.  M

## 2018-04-01 ENCOUNTER — Telehealth: Payer: Self-pay | Admitting: *Deleted

## 2018-04-01 NOTE — Telephone Encounter (Signed)
Called patient's granddaughter, [name] to inform her that there are no changes to be made in patient's medication at this time.

## 2018-04-06 ENCOUNTER — Emergency Department: Payer: Medicare Other

## 2018-04-06 ENCOUNTER — Encounter: Payer: Self-pay | Admitting: Emergency Medicine

## 2018-04-06 ENCOUNTER — Emergency Department
Admission: EM | Admit: 2018-04-06 | Discharge: 2018-04-06 | Disposition: A | Discharge status: Home / Self Care | Payer: Medicare Other | Attending: Emergency Medicine | Admitting: Emergency Medicine

## 2018-04-06 DIAGNOSIS — Z7901 Long term (current) use of anticoagulants: Secondary | ICD-10-CM | POA: Insufficient documentation

## 2018-04-06 DIAGNOSIS — M6281 Muscle weakness (generalized): Secondary | ICD-10-CM | POA: Diagnosis not present

## 2018-04-06 DIAGNOSIS — Z7982 Long term (current) use of aspirin: Secondary | ICD-10-CM | POA: Diagnosis not present

## 2018-04-06 DIAGNOSIS — I110 Hypertensive heart disease with heart failure: Secondary | ICD-10-CM | POA: Diagnosis not present

## 2018-04-06 DIAGNOSIS — R531 Weakness: Secondary | ICD-10-CM

## 2018-04-06 DIAGNOSIS — J449 Chronic obstructive pulmonary disease, unspecified: Secondary | ICD-10-CM | POA: Insufficient documentation

## 2018-04-06 DIAGNOSIS — Z8673 Personal history of transient ischemic attack (TIA), and cerebral infarction without residual deficits: Secondary | ICD-10-CM | POA: Diagnosis not present

## 2018-04-06 DIAGNOSIS — N3000 Acute cystitis without hematuria: Secondary | ICD-10-CM | POA: Insufficient documentation

## 2018-04-06 DIAGNOSIS — Z96643 Presence of artificial hip joint, bilateral: Secondary | ICD-10-CM | POA: Diagnosis not present

## 2018-04-06 DIAGNOSIS — I5032 Chronic diastolic (congestive) heart failure: Secondary | ICD-10-CM | POA: Diagnosis not present

## 2018-04-06 DIAGNOSIS — R600 Localized edema: Secondary | ICD-10-CM | POA: Insufficient documentation

## 2018-04-06 DIAGNOSIS — R609 Edema, unspecified: Secondary | ICD-10-CM

## 2018-04-06 LAB — BASIC METABOLIC PANEL
Anion gap: 8 (ref 5–15)
BUN: 17 mg/dL (ref 6–20)
CHLORIDE: 106 mmol/L (ref 101–111)
CO2: 26 mmol/L (ref 22–32)
Calcium: 8.9 mg/dL (ref 8.9–10.3)
Creatinine, Ser: 0.48 mg/dL (ref 0.44–1.00)
GFR calc Af Amer: >60 mL/min (ref >60)
GFR calc non Af Amer: >60 mL/min (ref >60)
GLUCOSE: 112 mg/dL — AB (ref 65–99)
POTASSIUM: 4.6 mmol/L (ref 3.5–5.1)
Sodium: 140 mmol/L (ref 135–145)

## 2018-04-06 LAB — CBC
HEMATOCRIT: 32.2 % — AB (ref 35.0–47.0)
HEMOGLOBIN: 9.9 g/dL — AB (ref 12.0–16.0)
MCH: 25.9 pg — AB (ref 26.0–34.0)
MCHC: 30.9 g/dL — ABNORMAL LOW (ref 32.0–36.0)
MCV: 83.9 fL (ref 80.0–100.0)
PLATELETS: 542 10*3/uL — AB (ref 150–440)
RBC: 3.84 MIL/uL (ref 3.80–5.20)
RDW: 38.4 % — ABNORMAL HIGH (ref 11.5–14.5)
WBC: 14.7 10*3/uL — ABNORMAL HIGH (ref 3.6–11.0)

## 2018-04-06 LAB — URINALYSIS, COMPLETE (UACMP) WITH MICROSCOPIC
BILIRUBIN URINE: NEGATIVE
Glucose, UA: NEGATIVE mg/dL
Hgb urine dipstick: NEGATIVE
KETONES UR: NEGATIVE mg/dL
Nitrite: POSITIVE — AB
Protein, ur: NEGATIVE mg/dL
Specific Gravity, Urine: 1.023 (ref 1.005–1.030)
pH: 5 (ref 5.0–8.0)

## 2018-04-06 LAB — BRAIN NATRIURETIC PEPTIDE: B Natriuretic Peptide: 39 pg/mL (ref 0.0–100.0)

## 2018-04-06 LAB — TROPONIN I: Troponin I: <0.03 ng/mL (ref <0.03)

## 2018-04-06 MED ORDER — FUROSEMIDE 10 MG/ML IJ SOLN
20.0000 mg | Freq: Once | INTRAMUSCULAR | Status: AC
  Administered 2018-04-06: 20 mg via INTRAVENOUS
  Filled 2018-04-06: qty 4

## 2018-04-06 MED ORDER — CEPHALEXIN 250 MG PO CAPS: 250.0000 mg | ORAL_CAPSULE | Freq: Four times a day (QID) | ORAL | 0 refills | Status: AC

## 2018-04-06 MED ORDER — SODIUM CHLORIDE 0.9 % IV SOLN
1.0000 g | Freq: Once | INTRAVENOUS | Status: AC
  Administered 2018-04-06: 1 g via INTRAVENOUS
  Filled 2018-04-06: qty 10

## 2018-04-06 MED FILL — JAKAFI 15 MG TABLET: 15 | 30 days supply | Qty: 60 | Fill #1

## 2018-04-06 NOTE — Discharge Instructions (Signed)

## 2018-04-06 NOTE — ED Provider Notes (Signed)
Ongoing care assigned Dr. Mariea Clonts.  Patient pending discharge, awaiting EMS transport to home due to baseline and ability to ambulate, wheelchair dependence.  Treated for urinary tract infection.   Delman Kitten, MD 04/06/18 2106

## 2018-04-06 NOTE — ED Provider Notes (Signed)
[facility] Emergency Department Provider Note   ____________________________________________   First MD Initiated Contact with Patient 04/06/18 1605     (approximate)  I have reviewed the triage vital signs and the nursing notes.   HISTORY  Chief Complaint Leg Swelling    HPI Yolanda Wells is a 84 y.o. female history of previous DVT, PE, congestive heart failure, prior stroke, wheelchair dependent and frequent UTIs  Patient presents for evaluation this morning.  Patient's granddaughter who provides care for her reports that she lives at home, she is wheelchair dependent but over the last 2 days she has had increased weakness.  She has baseline weakness and can usually stand with the assistance of one person, but they have noticed she just seems increasingly fatigued over the last 2 days.  She had similar symptoms in the past with urinary tract infections.  She reports she has not had any fevers or chills.  No nausea vomiting.  Continuing to eat and drink fluids.  She did have one episode of incontinence today as well.  Grandmother also reports they have noticed increased swelling in her lower legs for last 2 days, gave her an extra dose of Lasix today.  When asked about the fluid bolus mention in triage note, patient and family reports there is been no fluid bolus or extra fluid administration but they gave an extra dose of Lasix because of increased edema.  She is been acting normally.  No change in her mental status.  She has not been acting confused.  She is continued on her Eliquis treatment.  Not having any chest pain.  No trouble breathing.    Past Medical History:  Diagnosis Date  . Acute deep vein thrombosis (DVT) of distal vein of left lower extremity ([place]) 10/03/2015  . Acute pulmonary embolism ([place]) 10/03/2015  . Anticoagulant long-term use   . Arthritis   . CHF (congestive heart failure) ([place])   . Chronic anticoagulation 10/03/2015  .  Collagen vascular disease ([place])   . Colostomy in place [facility]) 10/04/2015  . Deep venous thrombosis ([facility])    right lower extremity  . Dependent edema   . Diverticulosis of intestine with bleeding 04/02/2015  . Glenohumeral arthritis 06/28/2012  . Hammertoe 09/02/2012  . History of bilateral hip replacements 09/02/2012  . History of hysterectomy   . Hypertension   . Hypokalemia   . Inability to ambulate due to hip 10/12/2015  . Mandibular dysfunction   . Onychomycosis 09/02/2012  . Osteoarthritis   . Polycythemia vera ([place])   . Presence of IVC filter 05/25/2015  . Stroke ([place]) 03/26/2015   cerebellar  . Urinary incontinence in female 09/02/2016    Patient Active Problem List   Diagnosis Date Noted  . Secondary myelofibrosis ([place]) 02/17/2018  . Goals of care, counseling/discussion 12/23/2017  . Palliative care by specialist   . DNR (do not resuscitate) discussion   . Weakness generalized   . Chronic diastolic CHF (congestive heart failure) ([place]) 07/28/2017  . Influenza with respiratory manifestation 12/01/2016  . Respiratory distress 11/23/2016  . Urinary incontinence in female 09/02/2016  . Vision loss of right eye 08/26/2016  . Blindness of right eye 08/26/2016  . Inability to ambulate due to hip 10/12/2015  . Colostomy in place [facility]) 10/04/2015  . Long term current use of anticoagulant therapy 10/03/2015  . Leukocytosis 09/25/2015  . CAP (community acquired pneumonia) 09/24/2015  . COPD (chronic obstructive pulmonary disease) ([place]) 09/24/2015  . UTI (urinary tract infection)  07/16/2015  . Abdominal wall cellulitis 07/15/2015  . DVT (deep venous thrombosis) ([place]) 07/15/2015  . HTN (hypertension) 07/15/2015  . Polycythemia vera ([place]) 07/15/2015  . Arthritis 07/15/2015  . Pulmonary emboli ([place]) 05/25/2015  . Diverticulosis of colon with hemorrhage 04/02/2015  . Diverticulosis of intestine with bleeding 04/02/2015  . Fall 01/10/2015  . Osteoarthritis 01/10/2015  .  Hammertoe 09/02/2012  . S/P transmetatarsal amputation of foot ([place]) 09/02/2012  . Onychomycosis 09/02/2012  . Other specified dermatoses 09/02/2012  . S/P hip replacement 08/23/2012  . Glenohumeral arthritis 06/28/2012    Past Surgical History:  Procedure Laterality Date  . ABDOMINAL HYSTERECTOMY    . BLEPHAROPLASTY Right 03/2017   right upper eyelid  . CARDIAC CATHETERIZATION Right 04/03/2015   Procedure: CENTRAL LINE INSERTION;  Surgeon: Sherri Rad, MD;  Location: [facility] ORS;  Service: General;  Laterality: Right;  . COLECTOMY WITH COLOSTOMY CREATION/HARTMANN PROCEDURE N/A 04/03/2015   Procedure: COLECTOMY WITH COLOSTOMY CREATION/HARTMANN PROCEDURE;  Surgeon: Sherri Rad, MD;  Location: [facility] ORS;  Service: General;  Laterality: N/A;  . COLON SURGERY    . FOOT AMPUTATION     partial  . FOOT AMPUTATION Right   . JOINT REPLACEMENT     Left and Right Hip  . PERIPHERAL VASCULAR CATHETERIZATION N/A 04/02/2015   Procedure: Visceral Angiography;  Surgeon: Algernon Huxley, MD;  Location: [place] CV LAB;  Service: Cardiovascular;  Laterality: N/A;  . PERIPHERAL VASCULAR CATHETERIZATION N/A 04/02/2015   Procedure: Visceral Artery Intervention;  Surgeon: Algernon Huxley, MD;  Location: [place] CV LAB;  Service: Cardiovascular;  Laterality: N/A;  . PERIPHERAL VASCULAR CATHETERIZATION N/A 05/25/2015   Procedure: IVC Filter Insertion;  Surgeon: Katha Cabal, MD;  Location: [place] CV LAB;  Service: Cardiovascular;  Laterality: N/A;  . TOE AMPUTATION     right  . TOTAL HIP ARTHROPLASTY      Prior to Admission medications   Medication Sig Start Date End Date Taking? Authorizing Provider  amLODipine (NORVASC) 5 MG tablet Take 5 mg by mouth daily.    [provider]  apixaban (ELIQUIS) 5 MG TABS tablet Take 1 tablet (5 mg total) by mouth 2 (two) times daily. 03/17/18   Karen Kitchens, NP  aspirin EC 81 MG tablet Take 1 tablet (81 mg total) by mouth daily. 09/16/15   [name], MD  cephALEXin (KEFLEX) 250 MG capsule Take 1 capsule (250 mg total) by mouth 4 (four) times daily for 10 days. 04/06/18 04/16/18  Delman Kitten, MD  docusate sodium (COLACE) 100 MG capsule Take 100 mg by mouth 2 (two) times daily.     [provider]  ENSURE (ENSURE) Take 1 Can by mouth 3 (three) times daily with meals.    [provider]  folic acid (FOLVITE) 403 MCG tablet Take 400 mcg by mouth daily.    [provider]  furosemide (LASIX) 20 MG tablet Take 1 tablet (20 mg total) by mouth daily. 03/10/18   Alisa Graff, FNP  hydroxyurea (HYDREA) 500 MG capsule Take 2 capsules (1,000 mg total) by mouth daily. 03/03/18   Karen Kitchens, NP  ipratropium-albuterol (DUONEB) 0.5-2.5 (3) MG/3ML SOLN Take 3 mLs by nebulization every 4 (four) hours as needed.    [provider]  polyethylene glycol (MIRALAX / GLYCOLAX) packet Take 17 g by mouth daily.    [provider]  potassium chloride (K-DUR,KLOR-CON) 10 MEQ tablet Take 10 mEq by mouth as needed. TAKE WITH LASIX PRN  [provider]  ruxolitinib phosphate (JAKAFI) 15 MG tablet Take 1 tablet (15 mg total) by mouth 2 (two) times daily. 03/08/18   Lequita Asal, MD    Allergies Sulfamethoxazole-trimethoprim  Family History  Problem Relation Age of Onset  . Brain cancer Mother   . Heart attack Father   . COPD Father   . Coronary artery disease Father   . Hypertension Unknown   . Arthritis-Osteo Unknown   . Diabetes Brother   . Arthritis Brother   . Osteosarcoma Son   . Bone cancer Son   . Arthritis Sister   . Arthritis Brother   . Hypertension Brother   . Coronary artery disease Brother     Social History Social History   Tobacco Use  . Smoking status: Never Smoker  . Smokeless tobacco: Never Used  Substance Use Topics  . Alcohol use: No  . Drug use: No    Review of Systems Constitutional: No fever/chills Eyes: No visual changes. ENT: No sore  throat. Cardiovascular: Denies chest pain. Respiratory: Denies shortness of breath. Gastrointestinal: No abdominal pain.  No nausea, no vomiting.  No diarrhea.  No constipation. Genitourinary: Negative for dysuria. Musculoskeletal: Negative for back pain. Skin: Negative for rash. Neurological: Negative for headaches, focal weakness or numbness.    ____________________________________________   PHYSICAL EXAM:  VITAL SIGNS: ED Triage Vitals  Enc Vitals Group     BP 04/06/18 1539 120/71     Pulse Rate 04/06/18 1539 74     Resp 04/06/18 1539 17     Temp 04/06/18 1539 97.7 F (36.5 C)     Temp Source 04/06/18 1539 Oral     SpO2 04/06/18 1539 96 %     Weight --      Height --      Head Circumference --      Peak Flow --      Pain Score 04/06/18 1546 0     Pain Loc --      Pain Edu? --      Excl. in [place]? --     Constitutional: Alert and oriented.  Chronically ill, somewhat fatigued in appearance but in no distress. Eyes: Conjunctivae are normal. Head: Atraumatic. Nose: No congestion/rhinnorhea. Mouth/Throat: Mucous membranes are moist. Neck: No stridor.   Cardiovascular: Normal rate, regular rhythm. Grossly normal heart sounds.  Good peripheral circulation. Respiratory: Normal respiratory effort.  No retractions. Lungs CTAB. Gastrointestinal: Soft and nontender. No distention. Musculoskeletal: No lower extremity tenderness nor edema.  Moves upper extremities well with good strength.  Her lower extremities of about 3+ strength bilaterally, no focal abnormality.  She does have 1+ pitting edema in the lower extremities bilateral, granddaughter reports this is increased from her baseline seems to worsen over the last 2 days.  Same is happened before with increased fluid need for Lasix. Neurologic:  Normal speech and language somewhat soft spoken potentially hard of hearing. No gross focal neurologic deficits are appreciated.  Very pleasant.  Equal symmetric smile. Skin:  Skin is  warm, dry and intact. No rash noted. Psychiatric: Mood and affect are normal. Speech and behavior are normal.  No psychomotor agitation.  ____________________________________________   LABS (all labs ordered are listed, but only abnormal results are displayed)  Labs Reviewed  CBC - Abnormal; Notable for the following components:      Result Value   WBC 14.7 (*)    Hemoglobin 9.9 (*)    HCT 32.2 (*)    MCH 25.9 (*)  MCHC 30.9 (*)    RDW 38.4 (*)    Platelets 542 (*)    All other components within normal limits  BASIC METABOLIC PANEL - Abnormal; Notable for the following components:   Glucose, Bld 112 (*)    All other components within normal limits  URINALYSIS, COMPLETE (UACMP) WITH MICROSCOPIC - Abnormal; Notable for the following components:   Color, Urine YELLOW (*)    APPearance CLEAR (*)    Nitrite POSITIVE (*)    Leukocytes, UA SMALL (*)    Bacteria, UA MANY (*)    All other components within normal limits  URINE CULTURE  TROPONIN I  BRAIN NATRIURETIC PEPTIDE   ____________________________________________  EKG  Reviewed enterotomy at 1545 Heart rate 79 cures 149 QTC 450 Normal sinus rhythm, right bundle branch block, old inferior Q waves also lateral Q waves.  No evidence of acute ischemia.  Compared with previous EKG from February 23, 2018, no notable new changes found. ____________________________________________  RADIOLOGY    Chest x-ray result reviewed, negative for acute ____________________________________________   PROCEDURES  Procedure(s) performed: None  Procedures  Critical Care performed: No  ____________________________________________   INITIAL IMPRESSION / ASSESSMENT AND PLAN / ED COURSE  Pertinent labs & imaging results that were available during my care of the patient were reviewed by me and considered in my medical decision making (see chart for details).  Patient was for evaluation for increased fatigue over the last couple of  days, also increased swelling in her lower extremities and episode of urinary incontinence.  Took increased Lasix this morning due to increased edema.  She denies shortness of breath.  She does have evidence of some mild volume overload on examination.  No evidence of pulmonary compromise.  Afebrile stable hemodynamics.  No changes in mental status, reassuring neurologic examination with chronic ongoing weakness and wheelchair dependence.  We will watch a fairly thorough evaluation today, including EKG, chest x-ray blood work.  Also check urinalysis as similar symptoms in the past according to family with urinary tract infections.  No signs or symptoms of sepsis.  ----------------------------------------- 9:05 PM on 04/06/2018 -----------------------------------------  Patient started on IV antibiotic for apparent urinary tract infection.  Will culture.  Based on previous sensitivity will treat with cephalosporin, prescription Keflex.  Discussed with patient as well as her family including her granddaughter at bedside who provides care report they are comfortable with the plan for discharge, they have follow-up scheduled with home health nurse tomorrow and can follow-up closely with her primary care doctor.  We will continue to monitor carefully for worsening symptoms including fever, vomiting, increased fatigue confusion or further new concerns or surgery time to the emergency room.  Treatment with antibiotics.      ____________________________________________   FINAL CLINICAL IMPRESSION(S) / ED DIAGNOSES  Final diagnoses:  Peripheral edema  Acute cystitis without hematuria  General weakness      NEW MEDICATIONS STARTED DURING THIS VISIT:  New Prescriptions   CEPHALEXIN (KEFLEX) 250 MG CAPSULE    Take 1 capsule (250 mg total) by mouth 4 (four) times daily for 10 days.     Note:  This document was prepared using Dragon voice recognition software and may include unintentional  dictation errors.     Delman Kitten, MD 04/06/18 2106

## 2018-04-06 NOTE — ED Triage Notes (Signed)
Patient presents to ED via [facility] from home. Patient lives at home with her granddaughter who report she gave patient "a fluid bolus" this morning. Since, patient has developed pedal edema and mild shortness of breath. A&O x4.

## 2018-04-06 NOTE — ED Notes (Signed)
granddaughter states pt did not receive a fluid bolus this morning. States pt received a fluid pill this am with decrease in urination and increase in swelling. Pt has had one episode of incontinence this since arriving at [facility] ED

## 2018-04-06 NOTE — ED Notes (Signed)
Went over d/c instructions with granddaughter over phone

## 2018-04-08 ENCOUNTER — Other Ambulatory Visit: Payer: Self-pay | Admitting: Hematology and Oncology

## 2018-04-09 LAB — URINE CULTURE
Culture: >=100000 — AB
Special Requests: NORMAL

## 2018-04-13 ENCOUNTER — Encounter: Payer: Self-pay | Admitting: Emergency Medicine

## 2018-04-13 ENCOUNTER — Emergency Department: Payer: Medicare Other

## 2018-04-13 ENCOUNTER — Emergency Department
Admission: EM | Admit: 2018-04-13 | Discharge: 2018-04-13 | Disposition: A | Discharge status: Home / Self Care | Payer: Medicare Other | Attending: Emergency Medicine | Admitting: Emergency Medicine

## 2018-04-13 DIAGNOSIS — K435 Parastomal hernia without obstruction or  gangrene: Secondary | ICD-10-CM

## 2018-04-13 DIAGNOSIS — K5900 Constipation, unspecified: Secondary | ICD-10-CM | POA: Diagnosis not present

## 2018-04-13 DIAGNOSIS — I110 Hypertensive heart disease with heart failure: Secondary | ICD-10-CM | POA: Diagnosis not present

## 2018-04-13 DIAGNOSIS — I5032 Chronic diastolic (congestive) heart failure: Secondary | ICD-10-CM | POA: Insufficient documentation

## 2018-04-13 DIAGNOSIS — Z7982 Long term (current) use of aspirin: Secondary | ICD-10-CM | POA: Insufficient documentation

## 2018-04-13 DIAGNOSIS — J449 Chronic obstructive pulmonary disease, unspecified: Secondary | ICD-10-CM | POA: Diagnosis not present

## 2018-04-13 DIAGNOSIS — R531 Weakness: Secondary | ICD-10-CM | POA: Diagnosis present

## 2018-04-13 DIAGNOSIS — Z79899 Other long term (current) drug therapy: Secondary | ICD-10-CM | POA: Diagnosis not present

## 2018-04-13 DIAGNOSIS — Z96643 Presence of artificial hip joint, bilateral: Secondary | ICD-10-CM | POA: Diagnosis not present

## 2018-04-13 LAB — CBC
HCT: 35 % (ref 35.0–47.0)
Hemoglobin: 10.9 g/dL — ABNORMAL LOW (ref 12.0–16.0)
MCH: 26.2 pg (ref 26.0–34.0)
MCHC: 31.1 g/dL — ABNORMAL LOW (ref 32.0–36.0)
MCV: 84.2 fL (ref 80.0–100.0)
PLATELETS: 662 10*3/uL — AB (ref 150–440)
RBC: 4.16 MIL/uL (ref 3.80–5.20)
RDW: 39.3 % — ABNORMAL HIGH (ref 11.5–14.5)
WBC: 17.5 10*3/uL — AB (ref 3.6–11.0)

## 2018-04-13 LAB — BASIC METABOLIC PANEL
Anion gap: 6 (ref 5–15)
BUN: 9 mg/dL (ref 6–20)
CALCIUM: 8.5 mg/dL — AB (ref 8.9–10.3)
CHLORIDE: 104 mmol/L (ref 101–111)
CO2: 26 mmol/L (ref 22–32)
Creatinine, Ser: 0.65 mg/dL (ref 0.44–1.00)
GFR calc non Af Amer: >60 mL/min (ref >60)
Glucose, Bld: 149 mg/dL — ABNORMAL HIGH (ref 65–99)
Potassium: 4.5 mmol/L (ref 3.5–5.1)
Sodium: 136 mmol/L (ref 135–145)

## 2018-04-13 MED ORDER — POLYETHYLENE GLYCOL 3350 17 GM/SCOOP PO POWD: ORAL | 0 refills | Status: DC

## 2018-04-13 MED ORDER — IOHEXOL 300 MG/ML  SOLN
100.0000 mL | Freq: Once | INTRAMUSCULAR | Status: AC | PRN
  Administered 2018-04-13: 100 mL via INTRAVENOUS

## 2018-04-13 NOTE — ED Notes (Signed)
No stool in colostomy bag. No drainage noted at all since pt arrived to ED.

## 2018-04-13 NOTE — ED Triage Notes (Signed)
Brought by ems to triage for no stool in colostomy bag for 28 hours and weakness.  Patient reportedly was able to pivot to strethcer at home, but when ems staff got her up here, her feet gave way.  She was held up by staff and never went to the floor, but we got her back on the ems stretcher.  We then got an ED stretcher and moved her from ems to ed stretcher.  She say she has no pain in abd, just feels weak.  They tried to call GI doc and were sent here.

## 2018-04-13 NOTE — ED Notes (Signed)
Colostomy emptied and RN called Pts granddaughter to update. No answer so message was left. Pt updated on plan of care and verbalized understanding. Pt reports she does not ambulate independently and is usually sent back via EMS.

## 2018-04-13 NOTE — ED Provider Notes (Signed)
[facility] Provider Note  ____________________________________________  Time seen: Approximately 4:40 PM  I have reviewed the triage vital signs and the nursing notes.   HISTORY  Chief Complaint Constipation and Weakness    HPI Yolanda Wells is a 84 y.o. female with a history of CHF, diverticulosis, hypertension and colostomy who comes to the ED due to no stool output in her colostomy for the past 28 hours. She feels generally weak. She reports that she is eating normally and not vomiting. Denies fever or chills. She has generalized abdominal pain which is nonradiating, no aggravating or alleviating factors, moderate intensity and aching.      Past Medical History:  Diagnosis Date  . Acute deep vein thrombosis (DVT) of distal vein of left lower extremity ([place]) 10/03/2015  . Acute pulmonary embolism ([place]) 10/03/2015  . Anticoagulant long-term use   . Arthritis   . CHF (congestive heart failure) ([place])   . Chronic anticoagulation 10/03/2015  . Collagen vascular disease ([place])   . Colostomy in place [facility]) 10/04/2015  . Deep venous thrombosis (H[facility])    right lower extremity  . Dependent edema   . Diverticulosis of intestine with bleeding 04/02/2015  . Glenohumeral arthritis 06/28/2012  . Hammertoe 09/02/2012  . History of bilateral hip replacements 09/02/2012  . History of hysterectomy   . Hypertension   . Hypokalemia   . Inability to ambulate due to hip 10/12/2015  . Mandibular dysfunction   . Onychomycosis 09/02/2012  . Osteoarthritis   . Polycythemia vera ([place])   . Presence of IVC filter 05/25/2015  . 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 Urinary incontinence in female 09/02/2016     Patient Active Problem List   Diagnosis Date Noted  . Secondary myelofibrosis ([place]) 02/17/2018  . Goals of care, counseling/discussion 12/23/2017  . Palliative care by specialist   . DNR (do not resuscitate) discussion   . Weakness  generalized   . Chronic diastolic CHF (congestive heart failure) ([place]) 07/28/2017  . Influenza with respiratory manifestation 12/01/2016  . Respiratory distress 11/23/2016  . Urinary incontinence in female 09/02/2016  . Vision loss of right eye 08/26/2016  . Blindness of right eye 08/26/2016  . Inability to ambulate due to hip 10/12/2015  . Colostomy in place [facility]) 10/04/2015  . Long term current use of anticoagulant therapy 10/03/2015  . Leukocytosis 09/25/2015  . CAP (community acquired pneumonia) 09/24/2015  . COPD (chronic obstructive pulmonary disease) ([place]) 09/24/2015  . UTI (urinary tract infection) 07/16/2015  . Abdominal wall cellulitis 07/15/2015  . DVT (deep venous thrombosis) ([place]) 07/15/2015  . HTN (hypertension) 07/15/2015  . Polycythemia vera ([place]) 07/15/2015  . Arthritis 07/15/2015  . Pulmonary emboli ([place]) 05/25/2015  . Diverticulosis of colon with hemorrhage 04/02/2015  . Diverticulosis of intestine with bleeding 04/02/2015  . Fall 01/10/2015  . Osteoarthritis 01/10/2015  . Hammertoe 09/02/2012  . S/P transmetatarsal amputation of foot ([place]) 09/02/2012  . Onychomycosis 09/02/2012  . Other specified dermatoses 09/02/2012  . S/P hip replacement 08/23/2012  . Glenohumeral arthritis 06/28/2012     Past Surgical History:  Procedure Laterality Date  . ABDOMINAL HYSTERECTOMY    . BLEPHAROPLASTY Right 03/2017   right upper eyelid  . CARDIAC CATHETERIZATION Right 04/03/2015   Procedure: CENTRAL LINE INSERTION;  Surgeon: Sherri Rad, MD;  Location: [facility] ORS;  Service: General;  Laterality: Right;  . COLECTOMY WITH COLOSTOMY CREATION/HARTMANN PROCEDURE N/A 04/03/2015   Procedure: COLECTOMY WITH COLOSTOMY CREATION/HARTMANN PROCEDURE;  Surgeon: Sherri Rad, MD;  Location: [facility] ORS;  Service: General;  Laterality: N/A;  . COLON SURGERY    . FOOT AMPUTATION     partial  . FOOT AMPUTATION Right   . JOINT REPLACEMENT     Left and Right Hip  . PERIPHERAL VASCULAR  CATHETERIZATION N/A 04/02/2015   Procedure: Visceral Angiography;  Surgeon: Algernon Huxley, MD;  Location: [place] CV LAB;  Service: Cardiovascular;  Laterality: N/A;  . PERIPHERAL VASCULAR CATHETERIZATION N/A 04/02/2015   Procedure: Visceral Artery Intervention;  Surgeon: Algernon Huxley, MD;  Location: [place] CV LAB;  Service: Cardiovascular;  Laterality: N/A;  . PERIPHERAL VASCULAR CATHETERIZATION N/A 05/25/2015   Procedure: IVC Filter Insertion;  Surgeon: Katha Cabal, MD;  Location: [place] CV LAB;  Service: Cardiovascular;  Laterality: N/A;  . TOE AMPUTATION     right  . TOTAL HIP ARTHROPLASTY       Prior to Admission medications   Medication Sig Start Date End Date Taking? Authorizing Provider  amLODipine (NORVASC) 5 MG tablet Take 5 mg by mouth daily.   Yes [provider]  apixaban (ELIQUIS) 5 MG TABS tablet Take 1 tablet (5 mg total) by mouth 2 (two) times daily. 03/17/18  Yes Karen Kitchens, NP  aspirin EC 81 MG tablet Take 1 tablet (81 mg total) by mouth daily. 09/16/15  Yes [name], MD  cephALEXin (KEFLEX) 250 MG capsule Take 1 capsule (250 mg total) by mouth 4 (four) times daily for 10 days. 04/06/18 04/16/18 Yes Delman Kitten, MD  docusate sodium (COLACE) 100 MG capsule Take 100 mg by mouth 2 (two) times daily.    Yes [provider]  ENSURE (ENSURE) Take 1 Can by mouth 3 (three) times daily with meals.   Yes [provider]  folic acid (FOLVITE) 628 MCG tablet Take 400 mcg by mouth daily.   Yes [provider]  furosemide (LASIX) 20 MG tablet Take 1 tablet (20 mg total) by mouth daily. 03/10/18  Yes Hackney, Tina A, FNP  hydroxyurea (HYDREA) 500 MG capsule Take 2 capsules (1,000 mg total) by mouth daily. Patient taking differently: Take 500 mg by mouth daily.  03/03/18  Yes Karen Kitchens, NP  ipratropium-albuterol (DUONEB) 0.5-2.5 (3) MG/3ML SOLN Take 3 mLs by nebulization every 4 (four) hours as needed.   Yes [provider]  polyethylene glycol (MIRALAX / GLYCOLAX) packet Take 17 g by mouth daily.   Yes [provider]  potassium chloride (K-DUR,KLOR-CON) 10 MEQ tablet Take 10 mEq by mouth as needed. TAKE WITH LASIX PRN   Yes [provider]  ruxolitinib phosphate (JAKAFI) 15 MG tablet Take 1 tablet (15 mg total) by mouth 2 (two) times daily. 03/08/18  Yes Corcoran, Drue Second, MD  polyethylene glycol powder (GLYCOLAX/MIRALAX) powder 1 cap full in a full glass of water, once daily for 7 days. 04/13/18   Carrie Mew, MD     Allergies Sulfamethoxazole-trimethoprim   Family History  Problem Relation Age of Onset  . Brain cancer Mother   . Heart attack Father   . COPD Father   . Coronary artery disease Father   . Hypertension Unknown   . Arthritis-Osteo Unknown   . Diabetes Brother   . Arthritis Brother   . Osteosarcoma Son   . Bone cancer Son   . Arthritis Sister   . Arthritis Brother   . Hypertension Brother   . Coronary artery disease Brother     Social History Social History  Tobacco Use  . Smoking status: Never Smoker  . Smokeless tobacco: Never Used  Substance Use Topics  . Alcohol use: No  . Drug use: No    Review of Systems  Constitutional:   No fever or chills.   Cardiovascular:   No chest pain or syncope. Respiratory:   No dyspnea or cough. Gastrointestinal:   positive as above for abdominal pain and decreased ostomy stool output. No vomiting. Musculoskeletal:   Negative for focal pain or swelling All other systems reviewed and are negative except as documented above in ROS and HPI.  ____________________________________________   PHYSICAL EXAM:  VITAL SIGNS: ED Triage Vitals [04/13/18 1447]  Enc Vitals Group     BP (!) 144/69     Pulse Rate 77     Resp (!) 24     Temp (!) 97.1 F (36.2 C)     Temp Source Oral     SpO2 96 %     Weight      Height      Head Circumference      Peak Flow      Pain Score      Pain Loc      Pain  Edu?      Excl. in [place]?     Vital signs reviewed, nursing assessments reviewed.   Constitutional:   Alert and oriented.  Eyes:   Conjunctivae are normal. EOMI. PERRL. ENT      Head:   Normocephalic and atraumatic.      Nose:   No congestion/rhinnorhea.       Mouth/Throat:   dry mucous membranes, no pharyngeal erythema. No peritonsillar mass.       Neck:   No meningismus. Full ROM. Hematological/Lymphatic/Immunilogical:   No cervical lymphadenopathy. Cardiovascular:   RRR. Symmetric bilateral radial and DP pulses.  No murmurs.  Respiratory:   Normal respiratory effort without tachypnea/retractions. Breath sounds are clear and equal bilaterally. No wheezes/rales/rhonchi. Gastrointestinal:   Soft with generalized tenderness, nonfocal. Non distended. There is no CVA tenderness.  No rebound, rigidity, or guarding.colostomy site is clean and intact. No hard stool impaction palpable at the stoma.  Musculoskeletal:   Normal range of motion in all extremities. No joint effusions.  No lower extremity tenderness.  No edema. Neurologic:   Normal speech and language.  Motor grossly intact. No acute focal neurologic deficits are appreciated.  Skin:    Skin is warm, dry and intact. No rash noted.  No petechiae, purpura, or bullae.  ____________________________________________    LABS (pertinent positives/negatives) (all labs ordered are listed, but only abnormal results are displayed) Labs Reviewed  BASIC METABOLIC PANEL - Abnormal; Notable for the following components:      Result Value   Glucose, Bld 149 (*)    Calcium 8.5 (*)    All other components within normal limits  CBC - Abnormal; Notable for the following components:   WBC 17.5 (*)    Hemoglobin 10.9 (*)    MCHC 31.1 (*)    RDW 39.3 (*)    Platelets 662 (*)    All other components within normal limits   ____________________________________________   EKG  interpreted by me Sinus rhythm rate of 76, left axis, normal  intervals. Right bundle-branch block. No acute ischemic changes.  ____________________________________________    RADIOLOGY  Ct Abdomen Pelvis W Contrast  Result Date: 04/13/2018 CLINICAL DATA:  84 year old female post colostomy 2016. No stool in colostomy bag for 24 hours. Polycythemia vera. Weakness. Initial encounter. EXAM:  CT ABDOMEN AND PELVIS WITH CONTRAST TECHNIQUE: Multidetector CT imaging of the abdomen and pelvis was performed using the standard protocol following bolus administration of intravenous contrast. CONTRAST:  165mL OMNIPAQUE IOHEXOL 300 MG/ML  SOLN COMPARISON:  02/23/2018 CT. FINDINGS: Lower chest: Basilar atelectasis/scarring. Cardiomegaly. Mitral valve and mild aortic valve calcifications. Hepatobiliary: Top-normal size liver. No worrisome hepatic lesion. Four gallstones measuring up to 2 cm. No surrounding inflammation noted by CT. Pancreas: No pancreatic mass or inflammation. Spleen: Top normal size spleen. Nonspecific 6 mm low-density structure peripheral aspect of the spleen unchanged. Adrenals/Urinary Tract: No obstructing stone or hydronephrosis. Right kidney distorted by 3 large cysts. Single left renal cyst. No worrisome renal or adrenal lesion. Limited evaluation of urinary bladder secondary to streak artifact from hip replacement Stomach/Bowel: Post colostomy formation left abdomen into which transverse colon traverses. Parastomal hernia contains small bowel. The colon and small bowel which are extending through the colostomy site are narrowed but do not appear to be causing proximal obstruction. Multiple diverticula descending colon (resection site between transverse colon and descending colon). Vascular/Lymphatic: Atherosclerotic changes aorta. Aorta is tortuous without focal aneurysm. Narrowing origin celiac artery, superior mesenteric artery, inferior mesenteric artery and right renal artery without large vessel occlusion. Atherosclerotic changes iliac arteries with  ectasia. Inferior vena cava filter is in place. No adenopathy. Reproductive: Post hysterectomy.  No worrisome adnexal mass. Other: No free intraperitoneal air or drainable fluid collection. Musculoskeletal: Post bilateral hip replacements. Prominent degenerative changes L2-3 and L3-4. Minimal anterior slip L4 secondary to facet degenerative changes. IMPRESSION: Post colostomy formation left abdomen into which transverse colon traverses. Parastomal hernia contains small bowel. The colon and small bowel which are extending through the colostomy site are narrowed but do not appear to be causing proximal obstruction. Aortic Atherosclerosis (ICD10-I70.0). Remainder of findings relatively similar to prior exam. Electronically Signed   By: Genia Del M.D.   On: 04/13/2018 18:15    ____________________________________________   PROCEDURES Procedures Peripheral IV insertion by physician Indication: Multiple failed attempts by nursing staff, need for IV access and/or blood samples for workup Performed under continuous real-time ultrasound visualization Area cleaned with chlorhexidine. 20-gauge IV successfully placed in the right  antecubital fossa. 1 attempt, no complications, EBL 0.   ____________________________________________  DIFFERENTIAL DIAGNOSIS   bowel obstruction, perforation, constipation, internal hernia  CLINICAL IMPRESSION / ASSESSMENT AND PLAN / ED COURSE  Pertinent labs & imaging results that were available during my care of the patient were reviewed by me and considered in my medical decision making (see chart for details).    patient presents with decreased stool output and generalized weakness. Vital signs overall unremarkable, exam is nonfocal. I will obtain a CT scan of the abdomen and pelvis for further evaluation of her complex gastrointestinal state and decreased ostomy output.  Clinical Course as of Apr 13 2056  Tue Apr 13, 2018  1912 CT reveals parastomal hernia.  We'll discussed with surgery.   [PS]    Clinical Course User Index [PS] Carrie Mew, MD     ----------------------------------------- 8:56 PM on 04/13/2018 -----------------------------------------  Discussed with Dr. Rosana Hoes, recommended enema to manage the constipation aspect. Otherwise can follow up outpatient for the parastomal hernia since is no evidence of bowel obstruction or incarceration at this time.  however, when the nurse went to administer the enema, the patient had spontaneously passed a large amount of stool into her colostomy. She continues to feel well and is suitable for discharge home for outpatient follow-up. I will have  her take MiraLAX daily to prevent ongoing constipation  ____________________________________________   FINAL CLINICAL IMPRESSION(S) / ED DIAGNOSES    Final diagnoses:  Constipation, unspecified constipation type  Parastomal hernia without obstruction or gangrene     ED Discharge Orders        Ordered    polyethylene glycol powder (GLYCOLAX/MIRALAX) powder     04/13/18 2055      Portions of this note were generated with dragon dictation software. Dictation errors may occur despite best attempts at proofreading.    Carrie Mew, MD 04/13/18 2057

## 2018-04-13 NOTE — ED Notes (Addendum)
RN in room to perform enema and noted colostomy bag is full of stool. MD made aware. Pt continues to deny pain.

## 2018-04-13 NOTE — Discharge Instructions (Signed)
Your CT scan shows bowel hernia near your colostomy.  Take miralax daily to prevent constipation and follow up with surgery clinic for further evaluation of the hernia.

## 2018-04-13 NOTE — ED Notes (Signed)
EMS to bedside to take pt home. RN has called granddaughter to alert her they are on the way. No change in pt at this time. NAD.

## 2018-04-13 NOTE — ED Notes (Signed)
RN able to contact grand daughter. Report on pts care and discharge given and no questions at this time. Pt is to be sent home via EMS. Daughter is home to care for her.

## 2018-04-13 NOTE — ED Notes (Signed)
Enema order discontinued per MD verbal order.

## 2018-04-14 ENCOUNTER — Ambulatory Visit: Payer: Medicare Other | Admitting: Family

## 2018-04-19 ENCOUNTER — Telehealth: Payer: Self-pay | Admitting: *Deleted

## 2018-04-19 NOTE — Telephone Encounter (Signed)
Yolanda Wells requesting a return to discuss concerns over recent nose bleeds. Please return her call [phone]

## 2018-04-19 NOTE — Telephone Encounter (Signed)
[name] states patient too weak to come in and wants [facility] nurse to come draw it. I spoke with Ivin Booty at [facility] and she states patient is not due a draw until Wed, but she will contact office to see if someone can go out today or tomorrow to draw her labs

## 2018-04-19 NOTE — Telephone Encounter (Signed)
  Patient needs a CBC with diff.  M

## 2018-04-21 ENCOUNTER — Inpatient Hospital Stay: Payer: Medicare Other

## 2018-04-21 ENCOUNTER — Telehealth: Payer: Self-pay | Admitting: *Deleted

## 2018-04-21 ENCOUNTER — Inpatient Hospital Stay: Payer: Medicare Other | Admitting: Hematology and Oncology

## 2018-04-21 NOTE — Telephone Encounter (Signed)
Per scheduling team in [place]- daughter called. cnl today's apt with Dr. Mike Gip. Pt too week to come today for the apt. Daughter would like to r/s the apt in 1 week.

## 2018-04-21 NOTE — Progress Notes (Deleted)
[facility]-  Cancer Center  Clinic day:  04/21/2018   Chief Complaint: Yolanda Wells is a 84 y.o. 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  HPI:   The patient was last seen in the medical oncology clinic on 03/17/2018.  At that time, she felt "fine".  She had no acute complains. She had intermittent episodes of shortness of breath at rest.  She bruised easily on apixaban. Exam was stable.  She continued Jakafi 15 mg BID.  She was tapering off hydroxyurea (current dose 500 mg/day).  She stopped hydroxyurea on 03/29/2018.  CBCs have been followed: 04/06/2018:  Hematocrit 32.2, hemoglobin 9.9, MCV 83.9, platelets 542,000, WBC 14,700. 04/13/2018:  Hematocrit 35.0, hemoglobin 10.9, MCV 84.2, platelets 662,000, WBC 17,500.  She was seen in the [facility] ER on 04/06/2018 with leg swelling. She was seen in the [facility] ER on 04/13/2018 with constipation and weakness.  She contacted the clinic on 04/19/2018 with epistaxis.  During the interim,   Past Medical History:  Diagnosis Date  . Acute deep vein thrombosis (DVT) of distal vein of left lower extremity (HCC) 10/03/2015  . Acute pulmonary embolism (HCC) 10/03/2015  . Anticoagulant long-term use   . Arthritis   . CHF (congestive heart failure) (HCC)   . Chronic anticoagulation 10/03/2015  . Collagen vascular disease (HCC)   . Colostomy in place (HCC) 10/04/2015  . Deep venous thrombosis (HCC)    right lower extremity  . Dependent edema   . Diverticulosis of intestine with bleeding 04/02/2015  . Glenohumeral arthritis 06/28/2012  . Hammertoe 09/02/2012  . History of bilateral hip replacements 09/02/2012  . History of hysterectomy   . Hypertension   . Hypokalemia   . Inability to ambulate due to hip 10/12/2015  . Mandibular dysfunction   . Onychomycosis 09/02/2012  . Osteoarthritis   . Polycythemia vera (HCC)   . Presence of IVC filter 05/25/2015  .  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 Urinary incontinence in female 09/02/2016    Past Surgical History:  Procedure Laterality Date  . ABDOMINAL HYSTERECTOMY    . BLEPHAROPLASTY Right 03/2017   right upper eyelid  . CARDIAC CATHETERIZATION Right 04/03/2015   Procedure: CENTRAL LINE INSERTION;  Surgeon: Mark Bird, MD;  Location: [facility] ORS;  Service: General;  Laterality: Right;  . COLECTOMY WITH COLOSTOMY CREATION/HARTMANN PROCEDURE N/A 04/03/2015   Procedure: COLECTOMY WITH COLOSTOMY CREATION/HARTMANN PROCEDURE;  Surgeon: Mark Bird, MD;  Location: [facility] ORS;  Service: General;  Laterality: N/A;  . COLON SURGERY    . FOOT AMPUTATION     partial  . FOOT AMPUTATION Right   . JOINT REPLACEMENT     Left and Right Hip  . PERIPHERAL VASCULAR CATHETERIZATION N/A 04/02/2015   Procedure: Visceral Angiography;  Surgeon: Jason S Dew, MD;  Location: [facility] INVASIVE CV LAB;  Service: Cardiovascular;  Laterality: N/A;  . PERIPHERAL VASCULAR CATHETERIZATION N/A 04/02/2015   Procedure: Visceral Artery Intervention;  Surgeon: Jason S Dew, MD;  Location: [facility] INVASIVE CV LAB;  Service: Cardiovascular;  Laterality: N/A;  . PERIPHERAL VASCULAR CATHETERIZATION N/A 05/25/2015   Procedure: IVC Filter Insertion;  Surgeon: Gregory G Schnier, MD;  Location: [facility] INVASIVE CV LAB;  Service: Cardiovascular;  Laterality: N/A;  . TOE AMPUTATION     right  . TOTAL HIP ARTHROPLASTY      Family History  Problem Relation Age of Onset  . Brain cancer Mother   . Heart attack   Father   . COPD Father   . Coronary artery disease Father   . Hypertension Unknown   . Arthritis-Osteo Unknown   . Diabetes Brother   . Arthritis Brother   . Osteosarcoma Son   . Bone cancer Son   . Arthritis Sister   . Arthritis Brother   . Hypertension Brother   . Coronary artery disease Brother     Social History:  reports that she has never smoked. She has never used smokeless tobacco. She reports that she does not drink alcohol or use  drugs.  She lives alone on [address] at [facility] (independent living).  She moved out of [place].  She has been living with her grand daughter since 02/06/2017.  Her grand-daughter visits regularly ([name] Green [phone]).  The patient is accompanied by her grand daughter, [name], today.   Allergies:  Allergies  Allergen Reactions  . Sulfamethoxazole-Trimethoprim Nausea And Vomiting    Current Medications: Current Outpatient Medications  Medication Sig Dispense Refill  . amLODipine (NORVASC) 5 MG tablet Take 5 mg by mouth daily.    . apixaban (ELIQUIS) 5 MG TABS tablet Take 1 tablet (5 mg total) by mouth 2 (two) times daily. 120 tablet 1  . aspirin EC 81 MG tablet Take 1 tablet (81 mg total) by mouth daily. 30 tablet 2  . docusate sodium (COLACE) 100 MG capsule Take 100 mg by mouth 2 (two) times daily.     . ENSURE (ENSURE) Take 1 Can by mouth 3 (three) times daily with meals.    . folic acid (FOLVITE) 400 MCG tablet Take 400 mcg by mouth daily.    . furosemide (LASIX) 20 MG tablet Take 1 tablet (20 mg total) by mouth daily. 30 tablet 6  . hydroxyurea (HYDREA) 500 MG capsule Take 2 capsules (1,000 mg total) by mouth daily. (Patient taking differently: Take 500 mg by mouth daily. ) 120 capsule 0  . ipratropium-albuterol (DUONEB) 0.5-2.5 (3) MG/3ML SOLN Take 3 mLs by nebulization every 4 (four) hours as needed.    . polyethylene glycol (MIRALAX / GLYCOLAX) packet Take 17 g by mouth daily.    . polyethylene glycol powder (GLYCOLAX/MIRALAX) powder 1 cap full in a full glass of water, once daily for 7 days. 255 g 0  . potassium chloride (K-DUR,KLOR-CON) 10 MEQ tablet Take 10 mEq by mouth as needed. TAKE WITH LASIX PRN    . ruxolitinib phosphate (JAKAFI) 15 MG tablet Take 1 tablet (15 mg total) by mouth 2 (two) times daily. 60 tablet 1   No current facility-administered medications for this visit.     Review of Systems  Constitutional: Negative for diaphoresis, fever,  malaise/fatigue and weight loss.       Feels fine  HENT: Negative.   Eyes: Negative for pain and redness.       Decreased vision in right eye s/p embolic events.  Respiratory: Positive for shortness of breath. Negative for cough, hemoptysis and sputum production.   Cardiovascular: Negative for chest pain, palpitations, orthopnea, leg swelling and PND.  Gastrointestinal: Negative for abdominal pain, blood in stool, constipation, diarrhea, melena, nausea and vomiting.       Colostomy  Genitourinary: Negative for dysuria, frequency, hematuria and urgency.       Incontinent; wears briefs  Musculoskeletal: Negative for back pain, falls, joint pain and myalgias.  Skin: Negative for itching and rash.  Neurological: Negative for dizziness, tremors, weakness and headaches.  Endo/Heme/Allergies: Bruises/bleeds easily (on apixaban).  Psychiatric/Behavioral: Negative for depression,   memory loss and suicidal ideas. The patient is not nervous/anxious and does not have insomnia.   All other systems reviewed and are negative.  PERFORMANCE STATUS (ECOG):  3  Physical Exam: There were no vitals taken for this visit. GENERAL:  Elderly woman sitting comfortably in a wheelchair in the exam room in no acute distress. MENTAL STATUS:  Alert and oriented to person, place and time. HEAD:  Gray hair.  Normocephalic, atraumatic, face symmetric, no Cushingoid features. EYES:  Brown eyes.  Pupils equal round and reactive to light and accomodation.  No conjunctivitis or scleral icterus. ENT:  Oropharynx clear without lesion.  Tongue normal. Mucous membranes moist.  RESPIRATORY:  Clear to auscultation without rales, wheezes or rhonchi. CARDIOVASCULAR:  Regular rate and rhythm without murmur, rub or gallop. ABDOMEN:  Left sided ostomy.  Soft, non-tender, with active bowel sounds, and no hepatosplenomegaly.  No masses. SKIN:  Scattered bruises.  No rashes, ulcers or lesions. EXTREMITIES: Chronic lower extremity edema.   No skin discoloration or tenderness.  No palpable cords. LYMPH NODES: No palpable cervical, supraclavicular, axillary or inguinal adenopathy  NEUROLOGICAL: Unremarkable. PSYCH:  Appropriate.   No visits with results within 3 Day(s) from this visit.  Latest known visit with results is:  Admission on 04/13/2018, Discharged on 04/13/2018  Component Date Value Ref Range Status  . Sodium 04/13/2018 136  135 - 145 mmol/L Final  . Potassium 04/13/2018 4.5  3.5 - 5.1 mmol/L Final   HEMOLYSIS AT THIS LEVEL MAY AFFECT RESULT  . Chloride 04/13/2018 104  101 - 111 mmol/L Final  . CO2 04/13/2018 26  22 - 32 mmol/L Final  . Glucose, Bld 04/13/2018 149* 65 - 99 mg/dL Final  . BUN 04/13/2018 9  6 - 20 mg/dL Final  . Creatinine, Ser 04/13/2018 0.65  0.44 - 1.00 mg/dL Final  . Calcium 04/13/2018 8.5* 8.9 - 10.3 mg/dL Final  . GFR calc non Af Amer 04/13/2018 >60  >60 mL/min Final  . GFR calc Af Amer 04/13/2018 >60  >60 mL/min Final   Comment: (NOTE) The eGFR has been calculated using the CKD EPI equation. This calculation has not been validated in all clinical situations. eGFR's persistently <60 mL/min signify possible Chronic Kidney Disease.   . Anion gap 04/13/2018 6  5 - 15 Final   Performed at [facility], [address] [place], [place] [address]  . WBC 04/13/2018 17.5* 3.6 - 11.0 K/uL Final  . RBC 04/13/2018 4.16  3.80 - 5.20 MIL/uL Final  . Hemoglobin 04/13/2018 10.9* 12.0 - 16.0 g/dL Final  . HCT 04/13/2018 35.0  35.0 - 47.0 % Final  . MCV 04/13/2018 84.2  80.0 - 100.0 fL Final  . MCH 04/13/2018 26.2  26.0 - 34.0 pg Final  . MCHC 04/13/2018 31.1* 32.0 - 36.0 g/dL Final  . RDW 04/13/2018 39.3* 11.5 - 14.5 % Final  . Platelets 04/13/2018 662* 150 - 440 K/uL Final   Performed at  Hospital [facility], [address] [place], [place] [address]    Assessment:  Yolanda Wells is a 84 y.o. female African-American woman with JAK2+ polycythemia rubra vera (PV) previously on a  phlebotomy program and hydroxyurea. She received P32 in an attempt to manage her counts in 03/2015 and on 05/14/2017.  Bone marrow on 01/07/2018 revealed a hypercellular bone marrow with a myeloproliferative neoplasm.  There was mild to moderate reticulin fibrosis.  Flow cytometry revealed no increased blasts.  Cytogenetics revealed 46, XX, der(15)t(1;15)(q11;p11).  The der 15t(1;15) has   been associated with polycythemia, AML, and MDS.  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 She was diagnosed with bilateral lower extremity DVTs on 05/18/2015 and bilateral pulmonary emboli on 05/24/2015. She underwent IVC filter placement on 05/25/2015.  She has been on a fluctuating dose of Coumadin secondary to unstable INR.  She is on a baby aspirin.  She developed progressive erythrocytosis, thrombocytosis, and leukocytosis.  She underwent phlebotomy for a hematocrit of 55.4 on 09/23/2015.  Hematocrit decreased to 50.0, but has again increased after initiation of oral iron.  Platelet count has increased from 1.1 million to 1.4 million.  White count increased from 23,000 - 28,000 to 31,800.  She was admitted on 08/25/2016 with loss of vision in her right eye.  CBC on 08/25/2016 revealed a hematocrit of 40.0, hemoglobin 13.5, MCV 91, platelets 842,000, WBC 4900.  INR was 2.59.  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  MRI and MRA showed no acute changes.  She has been back on hydroxyurea since 10/02/2015.  Initial dose was 1000 mg a day.  Maximum dose was 4 pills/day (total weekly dose: 28 pills).   Hydroxyurea was discontinued on 03/29/2018.  She began Jakafi 10 mg BID on 02/02/2018 (increased to 15 mg BID on 03/10/2018).  She requires periodic phlebotomies (goal hematocrit <= 42).  Platelet count remains elevated (secondary to PV and likely some component of iron  deficiency).  Goal platelet count is 400,000.  She was on Coumadin (discontinued secondary to clot and subtherapeutic INR).  She began Eliquis on 02/05/2018.  She was admitted to [facility] for 2.5 weeks with pneumonia then returned 07/03/2017 - 07/12/2017 with CHF.  She was admitted to [facility] from 09/26/2017 - 10/09/2017 with symptoms of heart failure and an E coli UTI.  She was treated with Lasix and ceftriaxone.  Echo revealed an EF of 60%.  CXR suggested pulmonary artery hypertension.    She was admitted to [facility] from 02/23/2018 - 02/26/2018 with abdominal pain and hypoxia.  She was felt to have decompensated CHF.  She was diuresed and her hypoxia resolved.  Urine culture was + E coli.  She was discharged on a 5 day course of Omnicef.  Symptomatically, she feels "fine".  She has no acute complains. She has intermittent episodes of shortness of breath at rest.  Notes that "menthol cough drops" help.  Patient bruises easily on apixaban. Exam is stable.   Plan: 1.  Labs today:  CBC with diff, CMP. 2.  Discuss recent [facility] ER evaluations.   3.  Continue weekly labs with home health.  4.  Continue Eliquis as prescribed (5 mg BID).  5.  Continue Jakafi as prescribed. Discussed need for weekly labs to monitor for neutropenia. Review neutropenic precautions. Anticipate eventual discontinuation of hydroxyurea and dose adjustment of Jakafi to reach goal counts (platelet count < 400,000, HCT < 42, ANC adequate). 6.  RTC in 1 month for MD assessment and labs (CBC with diff, CMP).   Lequita Asal, MD  04/21/2018, 5:01 AM   I saw and evaluated the patient, participating in the key portions of the service and reviewing pertinent diagnostic studies and records.  I reviewed the nurse practitioner's note and agree with the findings and the plan.  The assessment and plan were discussed with the patient.  Multiple questions were asked by the patient and answered.   Nolon Stalls, MD 04/21/2018,  5:01  AM  

## 2018-04-28 ENCOUNTER — Inpatient Hospital Stay: Payer: Medicare Other | Attending: Hematology and Oncology

## 2018-04-28 ENCOUNTER — Inpatient Hospital Stay (HOSPITAL_BASED_OUTPATIENT_CLINIC_OR_DEPARTMENT_OTHER): Payer: Medicare Other | Admitting: Hematology and Oncology

## 2018-04-28 ENCOUNTER — Encounter: Payer: Self-pay | Admitting: Hematology and Oncology

## 2018-04-28 VITALS — BP 132/84 | HR 71 | Temp 97.3°F

## 2018-04-28 DIAGNOSIS — Z86718 Personal history of other venous thrombosis and embolism: Secondary | ICD-10-CM

## 2018-04-28 DIAGNOSIS — Z8673 Personal history of transient ischemic attack (TIA), and cerebral infarction without residual deficits: Secondary | ICD-10-CM | POA: Insufficient documentation

## 2018-04-28 DIAGNOSIS — Z7901 Long term (current) use of anticoagulants: Secondary | ICD-10-CM | POA: Diagnosis not present

## 2018-04-28 DIAGNOSIS — D45 Polycythemia vera: Secondary | ICD-10-CM | POA: Insufficient documentation

## 2018-04-28 DIAGNOSIS — Z86711 Personal history of pulmonary embolism: Secondary | ICD-10-CM

## 2018-04-28 DIAGNOSIS — D649 Anemia, unspecified: Secondary | ICD-10-CM

## 2018-04-28 DIAGNOSIS — D7581 Myelofibrosis: Secondary | ICD-10-CM | POA: Diagnosis not present

## 2018-04-28 DIAGNOSIS — D471 Chronic myeloproliferative disease: Secondary | ICD-10-CM | POA: Diagnosis not present

## 2018-04-28 DIAGNOSIS — Z7189 Other specified counseling: Secondary | ICD-10-CM

## 2018-04-28 LAB — CBC WITH DIFFERENTIAL/PLATELET
Band Neutrophils: 5 %
Basophils Absolute: 0 10*3/uL (ref 0–0.1)
Basophils Relative: 0 %
Eosinophils Absolute: 0.1 10*3/uL (ref 0–0.7)
Eosinophils Relative: 1 %
HCT: 33.2 % — ABNORMAL LOW (ref 35.0–47.0)
Hemoglobin: 10.3 g/dL — ABNORMAL LOW (ref 12.0–16.0)
Lymphocytes Relative: 17 %
Lymphs Abs: 1.8 10*3/uL (ref 1.0–3.6)
MCH: 25.7 pg — ABNORMAL LOW (ref 26.0–34.0)
MCHC: 30.9 g/dL — ABNORMAL LOW (ref 32.0–36.0)
MCV: 83.3 fL (ref 80.0–100.0)
Metamyelocytes Relative: 2 %
Monocytes Absolute: 0.2 10*3/uL (ref 0.2–0.9)
Monocytes Relative: 2 %
Neutro Abs: 8.3 10*3/uL — ABNORMAL HIGH (ref 1.4–6.5)
Neutrophils Relative %: 73 %
Platelets: 437 10*3/uL (ref 150–440)
RBC: 3.98 MIL/uL (ref 3.80–5.20)
RDW: 39.2 % — ABNORMAL HIGH (ref 11.5–14.5)
WBC: 10.4 10*3/uL (ref 3.6–11.0)
nRBC: 2 /100 WBC — ABNORMAL HIGH

## 2018-04-28 LAB — COMPREHENSIVE METABOLIC PANEL
ALT: 8 U/L — ABNORMAL LOW (ref 14–54)
AST: 18 U/L (ref 15–41)
Albumin: 3.2 g/dL — ABNORMAL LOW (ref 3.5–5.0)
Alkaline Phosphatase: 65 U/L (ref 38–126)
Anion gap: 11 (ref 5–15)
BUN: 10 mg/dL (ref 6–20)
CO2: 25 mmol/L (ref 22–32)
Calcium: 8.6 mg/dL — ABNORMAL LOW (ref 8.9–10.3)
Chloride: 103 mmol/L (ref 101–111)
Creatinine, Ser: 0.48 mg/dL (ref 0.44–1.00)
GFR calc Af Amer: >60 mL/min (ref >60)
GFR calc non Af Amer: >60 mL/min (ref >60)
Glucose, Bld: 97 mg/dL (ref 65–99)
Potassium: 4.6 mmol/L (ref 3.5–5.1)
Sodium: 139 mmol/L (ref 135–145)
Total Bilirubin: 0.9 mg/dL (ref 0.3–1.2)
Total Protein: 7.3 g/dL (ref 6.5–8.1)

## 2018-04-28 NOTE — Progress Notes (Signed)
Patient offers no complaints today.   Granddaughter is concerned about paitent's hgb dropping.  States she had noticed blood when the patient has blown her nose.

## 2018-04-28 NOTE — Progress Notes (Signed)
[facility]-  Cancer Center  Clinic day:  04/28/2018   Chief Complaint: Yolanda Wells is a 84 y.o. 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  HPI:   The patient was last seen in the medical oncology clinic on 03/17/2018.  At that time, she felt "fine".  She had no acute complains. She had intermittent episodes of shortness of breath at rest.  She bruised easily on apixaban. Exam was stable.  She continued Jakafi 15 mg BID.  She was tapering off hydroxyurea (current dose 500 mg/day).  She stopped hydroxyurea on 03/29/2018.  CBCs have been followed: 04/06/2018:  Hematocrit 32.2, hemoglobin 9.9, MCV 83.9, platelets 542,000, WBC 14,700. 04/13/2018:  Hematocrit 35.0, hemoglobin 10.9, MCV 84.2, platelets 662,000, WBC 17,500.  She was seen in the [facility] ER on 04/06/2018 with leg swelling.  She was diagnosed with a UTI. She was seen in the [facility] ER on 04/13/2018 with constipation and weakness.  She contacted the clinic on 04/19/2018 with epistaxis in her Kleenex.  During the interim, she has done well.  She "just feels tired".  She denies any melena or hematochezia.  She denies any fevers.   Past Medical History:  Diagnosis Date  . Acute deep vein thrombosis (DVT) of distal vein of left lower extremity (HCC) 10/03/2015  . Acute pulmonary embolism (HCC) 10/03/2015  . Anticoagulant long-term use   . Arthritis   . CHF (congestive heart failure) (HCC)   . Chronic anticoagulation 10/03/2015  . Collagen vascular disease (HCC)   . Colostomy in place (HCC) 10/04/2015  . Deep venous thrombosis (HCC)    right lower extremity  . Dependent edema   . Diverticulosis of intestine with bleeding 04/02/2015  . Glenohumeral arthritis 06/28/2012  . Hammertoe 09/02/2012  . History of bilateral hip replacements 09/02/2012  . History of hysterectomy   . Hypertension   . Hypokalemia   . Inability to ambulate due to hip  10/12/2015  . Mandibular dysfunction   . Onychomycosis 09/02/2012  . Osteoarthritis   . Polycythemia vera (HCC)   . Presence of IVC filter 05/25/2015  . 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 Urinary incontinence in female 09/02/2016    Past Surgical History:  Procedure Laterality Date  . ABDOMINAL HYSTERECTOMY    . BLEPHAROPLASTY Right 03/2017   right upper eyelid  . CARDIAC CATHETERIZATION Right 04/03/2015   Procedure: CENTRAL LINE INSERTION;  Surgeon: Mark Bird, MD;  Location: [facility] ORS;  Service: General;  Laterality: Right;  . COLECTOMY WITH COLOSTOMY CREATION/HARTMANN PROCEDURE N/A 04/03/2015   Procedure: COLECTOMY WITH COLOSTOMY CREATION/HARTMANN PROCEDURE;  Surgeon: Mark Bird, MD;  Location: [facility] ORS;  Service: General;  Laterality: N/A;  . COLON SURGERY    . FOOT AMPUTATION     partial  . FOOT AMPUTATION Right   . JOINT REPLACEMENT     Left and Right Hip  . PERIPHERAL VASCULAR CATHETERIZATION N/A 04/02/2015   Procedure: Visceral Angiography;  Surgeon: Jason S Dew, MD;  Location: [facility] INVASIVE CV LAB;  Service: Cardiovascular;  Laterality: N/A;  . PERIPHERAL VASCULAR CATHETERIZATION N/A 04/02/2015   Procedure: Visceral Artery Intervention;  Surgeon: Jason S Dew, MD;  Location: [facility] INVASIVE CV LAB;  Service: Cardiovascular;  Laterality: N/A;  . PERIPHERAL VASCULAR CATHETERIZATION N/A 05/25/2015   Procedure: IVC Filter Insertion;  Surgeon: Gregory G Schnier, MD;  Location: [facility] INVASIVE CV LAB;  Service: Cardiovascular;  Laterality: N/A;  . TOE AMPUTATION       right  . TOTAL HIP ARTHROPLASTY      Family History  Problem Relation Age of Onset  . Brain cancer Mother   . Heart attack Father   . COPD Father   . Coronary artery disease Father   . Hypertension Unknown   . Arthritis-Osteo Unknown   . Diabetes Brother   . Arthritis Brother   . Osteosarcoma Son   . Bone cancer Son   . Arthritis Sister   . Arthritis Brother   . Hypertension Brother   . Coronary artery  disease Brother     Social History:  reports that she has never smoked. She has never used smokeless tobacco. She reports that she does not drink alcohol or use drugs.  She lives alone on [address] at [facility] (independent living).  She moved out of [facility].  She has been living with her grand daughter since 02/06/2017.  Her grand-daughter visits regularly [name] [phone]).  The patient is accompanied by her grand daughter, [name], today.   Allergies:  Allergies  Allergen Reactions  . Sulfamethoxazole-Trimethoprim Nausea And Vomiting    Current Medications: Current Outpatient Medications  Medication Sig Dispense Refill  . amLODipine (NORVASC) 5 MG tablet Take 5 mg by mouth daily.    [name] apixaban (ELIQUIS) 5 MG TABS tablet Take 1 tablet (5 mg total) by mouth 2 (two) times daily. 120 tablet 1  . aspirin EC 81 MG tablet Take 1 tablet (81 mg total) by mouth daily. 30 tablet 2  . docusate sodium (COLACE) 100 MG capsule Take 100 mg by mouth 2 (two) times daily.     [name] ENSURE (ENSURE) Take 1 Can by mouth 3 (three) times daily with meals.    . folic acid (FOLVITE) 240 MCG tablet Take 400 mcg by mouth daily.    . furosemide (LASIX) 20 MG tablet Take 1 tablet (20 mg total) by mouth daily. 30 tablet 6  . hydroxyurea (HYDREA) 500 MG capsule Take 2 capsules (1,000 mg total) by mouth daily. (Patient taking differently: Take 500 mg by mouth daily. ) 120 capsule 0  . ipratropium-albuterol (DUONEB) 0.5-2.5 (3) MG/3ML SOLN Take 3 mLs by nebulization every 4 (four) hours as needed.    . polyethylene glycol (MIRALAX / GLYCOLAX) packet Take 17 g by mouth daily.    . polyethylene glycol powder (GLYCOLAX/MIRALAX) powder 1 cap full in a full glass of water, once daily for 7 days. 255 g 0  . potassium chloride (K-DUR,KLOR-CON) 10 MEQ tablet Take 10 mEq by mouth as needed. TAKE WITH LASIX PRN    . ruxolitinib phosphate (JAKAFI) 15 MG tablet Take 1 tablet (15 mg total) by mouth 2 (two) times  daily. 60 tablet 1   No current facility-administered medications for this visit.     Review of Systems  Constitutional: Negative for diaphoresis, fever, malaise/fatigue and weight loss (no new weight).       Feels tired.  HENT: Positive for nosebleeds (brief). Negative for congestion, ear discharge, ear pain and sore throat.   Eyes: Negative for double vision, photophobia and discharge.       Decreased vision in right eye s/p embolic events.  Respiratory: Positive for shortness of breath. Negative for cough, hemoptysis and sputum production.   Cardiovascular: Negative.  Negative for chest pain, palpitations, orthopnea, leg swelling and PND.  Gastrointestinal: Negative for abdominal pain, blood in stool, constipation, diarrhea, nausea and vomiting.       Colostomy  Genitourinary: Negative for dysuria, frequency, hematuria and  urgency.       Incontinent; wears briefs; Interval UTI.  Musculoskeletal: Negative for back pain, falls, joint pain and myalgias.  Skin: Negative.  Negative for itching and rash.  Neurological: Negative for dizziness, tingling, tremors, sensory change, speech change, focal weakness, weakness and headaches.  Endo/Heme/Allergies: Bruises/bleeds easily (on apixaban).  Psychiatric/Behavioral: Negative for depression, memory loss and suicidal ideas. The patient is not nervous/anxious and does not have insomnia.   All other systems reviewed and are negative.  PERFORMANCE STATUS (ECOG):  3  Physical Exam: Blood pressure 132/84, pulse 71, temperature (!) 97.3 F (36.3 C), temperature source Tympanic, SpO2 99 %. GENERAL:  Elderly woman sitting comfortably in a wheelchair in the exam room in no acute distress. MENTAL STATUS:  Alert and oriented to person, place and time. HEAD:  Gray hair.  Normocephalic, atraumatic, face symmetric, no Cushingoid features. EYES:  Brown eyes.  Pupils equal round and reactive to light and accomodation.  No conjunctivitis or scleral  icterus. ENT:  Oropharynx clear without lesion.  Tongue normal. Mucous membranes moist.  RESPIRATORY:  Clear to auscultation without rales, wheezes or rhonchi. CARDIOVASCULAR:  Regular rate and rhythm without murmur, rub or gallop. ABDOMEN:  Left sided ostomy with brown liquid stool.  Soft, non-tender, with active bowel sounds, and no hepatosplenomegaly.  No masses. SKIN:  No rashes, ulcers or lesions. EXTREMITIES: Chronic lower extremity edema.  No skin discoloration or tenderness.  No palpable cords. LYMPH NODES: No palpable cervical, supraclavicular, axillary or inguinal adenopathy  NEUROLOGICAL: Unremarkable. PSYCH:  Appropriate.    Office Visit on 04/28/2018  Component Date Value Ref Range Status  . Ferritin 04/28/2018 203  11 - 307 ng/mL Final   Performed at [facility], [address] [place], [place] [address]  . Folate 04/28/2018 71.5  >5.9 ng/mL Corrected   Comment: CORRECTED RESULTS C/BRYAN GRAY AT 1014 04/29/18 DAS RESULTS CONFIRMED BY MANUAL DILUTION Performed at [facility], [address] [place], [place] [address] CORRECTED ON 06/20 AT 1017: PREVIOUSLY REPORTED AS >48.0   Appointment on 04/28/2018  Component Date Value Ref Range Status  . WBC 04/28/2018 10.4  3.6 - 11.0 K/uL Final  . RBC 04/28/2018 3.98  3.80 - 5.20 MIL/uL Final  . Hemoglobin 04/28/2018 10.3* 12.0 - 16.0 g/dL Final  . HCT 04/28/2018 33.2* 35.0 - 47.0 % Final  . MCV 04/28/2018 83.3  80.0 - 100.0 fL Final  . MCH 04/28/2018 25.7* 26.0 - 34.0 pg Final  . MCHC 04/28/2018 30.9* 32.0 - 36.0 g/dL Final  . RDW 04/28/2018 39.2* 11.5 - 14.5 % Final  . Platelets 04/28/2018 437  150 - 440 K/uL Final  . Neutrophils Relative % 04/28/2018 73  % Final  . Lymphocytes Relative 04/28/2018 17  % Final  . Monocytes Relative 04/28/2018 2  % Final  . Eosinophils Relative 04/28/2018 1  % Final  . Basophils Relative 04/28/2018 0  % Final  . Band Neutrophils 04/28/2018 5  % Final  . Metamyelocytes  Relative 04/28/2018 2  % Final  . nRBC 04/28/2018 2* 0 /100 WBC Final  . Neutro Abs 04/28/2018 8.3* 1.4 - 6.5 K/uL Final  . Lymphs Abs 04/28/2018 1.8  1.0 - 3.6 K/uL Final  . Monocytes Absolute 04/28/2018 0.2  0.2 - 0.9 K/uL Final  . Eosinophils Absolute 04/28/2018 0.1  0 - 0.7 K/uL Final  . Basophils Absolute 04/28/2018 0.0  0 - 0.1 K/uL Final  . RBC Morphology 04/28/2018 MIXED RBC POPULATION   Final     Performed at [facility], [address], [place], [place] [address]  . Sodium 04/28/2018 139  135 - 145 mmol/L Final  . Potassium 04/28/2018 4.6  3.5 - 5.1 mmol/L Final  . Chloride 04/28/2018 103  101 - 111 mmol/L Final  . CO2 04/28/2018 25  22 - 32 mmol/L Final  . Glucose, Bld 04/28/2018 97  65 - 99 mg/dL Final  . BUN 04/28/2018 10  6 - 20 mg/dL Final  . Creatinine, Ser 04/28/2018 0.48  0.44 - 1.00 mg/dL Final  . Calcium 04/28/2018 8.6* 8.9 - 10.3 mg/dL Final  . Total Protein 04/28/2018 7.3  6.5 - 8.1 g/dL Final  . Albumin 04/28/2018 3.2* 3.5 - 5.0 g/dL Final  . AST 04/28/2018 18  15 - 41 U/L Final  . ALT 04/28/2018 8* 14 - 54 U/L Final  . Alkaline Phosphatase 04/28/2018 65  38 - 126 U/L Final  . Total Bilirubin 04/28/2018 0.9  0.3 - 1.2 mg/dL Final  . GFR calc non Af Amer 04/28/2018 >60  >60 mL/min Final  . GFR calc Af Amer 04/28/2018 >60  >60 mL/min Final   Comment: (NOTE) The eGFR has been calculated using the CKD EPI equation. This calculation has not been validated in all clinical situations. eGFR's persistently <60 mL/min signify possible Chronic Kidney Disease.   Georgiann Hahn gap 04/28/2018 11  5 - 15 Final   Performed at [facility], [address], [place], [place] [address]    Assessment:  Yolanda Wells is a 84 y.o. female African-American woman with JAK2+ polycythemia rubra vera (PV) previously on a phlebotomy program and hydroxyurea. She received P32 in an attempt to manage her counts in 03/2015 and on 05/14/2017.  Bone marrow on 01/07/2018  revealed a hypercellular bone marrow with a myeloproliferative neoplasm.  There was mild to moderate reticulin fibrosis.  Flow cytometry revealed no increased blasts.  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  The der 15t(1;15) has been associated with polycythemia, AML, and MDS.  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 She was diagnosed with bilateral lower extremity DVTs on 05/18/2015 and bilateral pulmonary emboli on 05/24/2015. She underwent IVC filter placement on 05/25/2015.  She has been on a fluctuating dose of Coumadin secondary to unstable INR.  She is on a baby aspirin.  She developed progressive erythrocytosis, thrombocytosis, and leukocytosis.  She underwent phlebotomy for a hematocrit of 55.4 on 09/23/2015.  Hematocrit decreased to 50.0, but has again increased after initiation of oral iron.  Platelet count has increased from 1.1 million to 1.4 million.  White count increased from 23,000 - 28,000 to 31,800.  She was admitted on 08/25/2016 with loss of vision in her right eye.  CBC on 08/25/2016 revealed a hematocrit of 40.0, hemoglobin 13.5, MCV 91, platelets 842,000, WBC 4900.  INR was 2.59.  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  MRI and MRA showed no acute changes.  She has been back on hydroxyurea since 10/02/2015.  Initial dose was 1000 mg a day.  Maximum dose was 4 pills/day (total weekly dose: 28 pills).   Hydroxyurea was discontinued on 03/29/2018.  She began Jakafi 10 mg BID on 02/02/2018 (increased to 15 mg BID on 03/10/2018).  She requires periodic phlebotomies (goal hematocrit <= 42).  Platelet count remains elevated (secondary to PV and likely some component of iron deficiency).  Goal platelet count is 400,000.  She  was on Coumadin (discontinued secondary to clot and subtherapeutic INR).  She began Eliquis on  02/05/2018.  She was admitted to [facility] for 2.5 weeks with pneumonia then returned 07/03/2017 - 07/12/2017 with CHF.  She was admitted to [facility] from 09/26/2017 - 10/09/2017 with symptoms of heart failure and an E coli UTI.  She was treated with Lasix and ceftriaxone.  Echo revealed an EF of 60%.  CXR suggested pulmonary artery hypertension.    She was admitted to [facility] from 02/23/2018 - 02/26/2018 with abdominal pain and hypoxia.  She was felt to have decompensated CHF.  She was diuresed and her hypoxia resolved.  Urine culture was + E coli.  She was discharged on a 5 day course of Omnicef.  Symptomatically, she feels fatigued.  She had brief interval nosebleed.  Exam is stable. Hemoglobin is 10.3.  Platelet count is 437,000.  Plan: 1.  Labs today:  CBC with diff, CMP. 2.  Discuss recent [facility] ER evaluations. 3.  Discuss mild anemia likely due to Jakafi.  Check iron stores.  No need for phlebotomy. 4.  Continue Jakafi 15 mg BID.  Jakafi goal (platelet count < 400,000, HCT < 42, ANC adequate). 5.  Change labs to every other week.  If stable switch to monthly then eventually every 3 months. 6.  Continue Eliquis as prescribed (5 mg BID).  7.  RTC in 6 weeks for MD assessment and labs (CBC with diff, CMP).   Melissa C Corcoran, MD  04/28/2018, 4:35 PM  

## 2018-04-29 LAB — FOLATE: FOLATE: 71.5 ng/mL (ref >5.9)

## 2018-04-29 LAB — FERRITIN: FERRITIN: 203 ng/mL (ref 11–307)

## 2018-05-07 ENCOUNTER — Other Ambulatory Visit: Payer: Self-pay | Admitting: Hematology and Oncology

## 2018-05-07 DIAGNOSIS — D45 Polycythemia vera: Secondary | ICD-10-CM

## 2018-05-11 MED FILL — JAKAFI 15 MG TABLET: 15 | 30 days supply | Qty: 60 | Fill #0

## 2018-05-17 ENCOUNTER — Emergency Department: Payer: Medicare Other

## 2018-05-17 ENCOUNTER — Emergency Department
Admission: EM | Admit: 2018-05-17 | Discharge: 2018-05-18 | Disposition: A | Discharge status: Home / Self Care | Payer: Medicare Other | Attending: Emergency Medicine | Admitting: Emergency Medicine

## 2018-05-17 ENCOUNTER — Other Ambulatory Visit: Payer: Self-pay

## 2018-05-17 DIAGNOSIS — Z8673 Personal history of transient ischemic attack (TIA), and cerebral infarction without residual deficits: Secondary | ICD-10-CM | POA: Insufficient documentation

## 2018-05-17 DIAGNOSIS — Z7982 Long term (current) use of aspirin: Secondary | ICD-10-CM | POA: Diagnosis not present

## 2018-05-17 DIAGNOSIS — N3000 Acute cystitis without hematuria: Secondary | ICD-10-CM | POA: Diagnosis not present

## 2018-05-17 DIAGNOSIS — I110 Hypertensive heart disease with heart failure: Secondary | ICD-10-CM | POA: Diagnosis not present

## 2018-05-17 DIAGNOSIS — I5032 Chronic diastolic (congestive) heart failure: Secondary | ICD-10-CM | POA: Insufficient documentation

## 2018-05-17 DIAGNOSIS — R0602 Shortness of breath: Secondary | ICD-10-CM | POA: Diagnosis present

## 2018-05-17 DIAGNOSIS — J449 Chronic obstructive pulmonary disease, unspecified: Secondary | ICD-10-CM | POA: Insufficient documentation

## 2018-05-17 DIAGNOSIS — Z86718 Personal history of other venous thrombosis and embolism: Secondary | ICD-10-CM | POA: Insufficient documentation

## 2018-05-17 DIAGNOSIS — Z79899 Other long term (current) drug therapy: Secondary | ICD-10-CM | POA: Insufficient documentation

## 2018-05-17 LAB — URINALYSIS, COMPLETE (UACMP) WITH MICROSCOPIC
BILIRUBIN URINE: NEGATIVE
GLUCOSE, UA: NEGATIVE mg/dL
Ketones, ur: NEGATIVE mg/dL
NITRITE: POSITIVE — AB
PH: 5 (ref 5.0–8.0)
Protein, ur: NEGATIVE mg/dL
SPECIFIC GRAVITY, URINE: 1.017 (ref 1.005–1.030)
WBC, UA: >50 WBC/hpf — ABNORMAL HIGH (ref 0–5)

## 2018-05-17 LAB — CBC WITH DIFFERENTIAL/PLATELET
BASOS ABS: 0 10*3/uL (ref 0–0.1)
Band Neutrophils: 3 %
Basophils Relative: 0 %
Blasts: 0 %
EOS PCT: 1 %
Eosinophils Absolute: 0.1 10*3/uL (ref 0–0.7)
HCT: 26.8 % — ABNORMAL LOW (ref 35.0–47.0)
Hemoglobin: 8.6 g/dL — ABNORMAL LOW (ref 12.0–16.0)
LYMPHS ABS: 3.2 10*3/uL (ref 1.0–3.6)
Lymphocytes Relative: 28 %
MCH: 27 pg (ref 26.0–34.0)
MCHC: 32.2 g/dL (ref 32.0–36.0)
MCV: 83.9 fL (ref 80.0–100.0)
METAMYELOCYTES PCT: 0 %
MONOS PCT: 3 %
MYELOCYTES: 0 %
Monocytes Absolute: 0.3 10*3/uL (ref 0.2–0.9)
NEUTROS ABS: 7.8 10*3/uL — AB (ref 1.4–6.5)
Neutrophils Relative %: 65 %
Other: 0 %
Platelets: 551 10*3/uL — ABNORMAL HIGH (ref 150–440)
Promyelocytes Relative: 0 %
RBC: 3.19 MIL/uL — AB (ref 3.80–5.20)
RDW: 37.8 % — AB (ref 11.5–14.5)
WBC: 11.4 10*3/uL — ABNORMAL HIGH (ref 3.6–11.0)
nRBC: 15 /100 WBC — ABNORMAL HIGH

## 2018-05-17 LAB — HEPATIC FUNCTION PANEL
ALK PHOS: 65 U/L (ref 38–126)
ALT: 9 U/L (ref 0–44)
AST: 24 U/L (ref 15–41)
Albumin: 3 g/dL — ABNORMAL LOW (ref 3.5–5.0)
BILIRUBIN DIRECT: 0.2 mg/dL (ref 0.0–0.2)
BILIRUBIN INDIRECT: 0.7 mg/dL (ref 0.3–0.9)
BILIRUBIN TOTAL: 0.9 mg/dL (ref 0.3–1.2)
Total Protein: 6.6 g/dL (ref 6.5–8.1)

## 2018-05-17 LAB — PROTIME-INR
INR: 1.94
Prothrombin Time: 22 seconds — ABNORMAL HIGH (ref 11.4–15.2)

## 2018-05-17 LAB — BASIC METABOLIC PANEL
Anion gap: 8 (ref 5–15)
BUN: 15 mg/dL (ref 8–23)
CO2: 27 mmol/L (ref 22–32)
Calcium: 8.9 mg/dL (ref 8.9–10.3)
Chloride: 108 mmol/L (ref 98–111)
Creatinine, Ser: 0.52 mg/dL (ref 0.44–1.00)
GLUCOSE: 102 mg/dL — AB (ref 70–99)
POTASSIUM: 4.1 mmol/L (ref 3.5–5.1)
Sodium: 143 mmol/L (ref 135–145)

## 2018-05-17 LAB — TROPONIN I
TROPONIN I: 0.03 ng/mL — AB (ref <0.03)
TROPONIN I: 0.03 ng/mL — AB (ref <0.03)

## 2018-05-17 LAB — BRAIN NATRIURETIC PEPTIDE: B Natriuretic Peptide: 74 pg/mL (ref 0.0–100.0)

## 2018-05-17 LAB — LACTIC ACID, PLASMA: LACTIC ACID, VENOUS: 0.9 mmol/L (ref 0.5–1.9)

## 2018-05-17 MED ORDER — IPRATROPIUM-ALBUTEROL 0.5-2.5 (3) MG/3ML IN SOLN
3.0000 mL | Freq: Once | RESPIRATORY_TRACT | Status: AC
Start: 2018-05-17 — End: 2018-05-17
  Administered 2018-05-17: 3 mL via RESPIRATORY_TRACT
  Filled 2018-05-17: qty 3

## 2018-05-17 MED ORDER — IOHEXOL 350 MG/ML SOLN
75.0000 mL | Freq: Once | INTRAVENOUS | Status: AC | PRN
  Administered 2018-05-18: 75 mL via INTRAVENOUS

## 2018-05-17 NOTE — ED Notes (Signed)
Report given to Brooke RN

## 2018-05-17 NOTE — ED Provider Notes (Signed)
[facility] Emergency Department Provider Note  ____________________________________________   First MD Initiated Contact with Patient 05/17/18 1844     (approximate)  I have reviewed the triage vital signs and the nursing notes.   HISTORY  Chief Complaint Shortness of Breath   HPI Yolanda Wells is a 84 y.o. female who presents to the emergency department for evaluation of shortness of breath. She has a significant past medical history of polycythemia vera, DVT and PE who is on Eliquis and has an IVC filter.  Patient states that her shortness of breath has been increasing over the past week and has not been relieved with her 2 L of oxygen and albuterol.  Patient denies chest pain, abdominal pain, nausea, vomiting, or diarrhea.  Since being placed on 3 L of oxygen, she states that her shortness of breath has improved.   Past Medical History:  Diagnosis Date  . Acute deep vein thrombosis (DVT) of distal vein of left lower extremity ([place]) 10/03/2015  . Acute pulmonary embolism ([place]) 10/03/2015  . Anticoagulant long-term use   . Arthritis   . CHF (congestive heart failure) ([place])   . Chronic anticoagulation 10/03/2015  . Collagen vascular disease ([place])   . Colostomy in place [facility]) 10/04/2015  . Deep venous thrombosis (H[facility])    right lower extremity  . Dependent edema   . Diverticulosis of intestine with bleeding 04/02/2015  . Glenohumeral arthritis 06/28/2012  . Hammertoe 09/02/2012  . History of bilateral hip replacements 09/02/2012  . History of hysterectomy   . Hypertension   . Hypokalemia   . Inability to ambulate due to hip 10/12/2015  . Mandibular dysfunction   . Onychomycosis 09/02/2012  . Osteoarthritis   . Polycythemia vera ([place])   . Presence of IVC filter 05/25/2015  . 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 Urinary incontinence in female 09/02/2016    Patient Active Problem List   Diagnosis Date Noted  . Secondary myelofibrosis  ([place]) 02/17/2018  . Goals of care, counseling/discussion 12/23/2017  . Palliative care by specialist   . DNR (do not resuscitate) discussion   . Weakness generalized   . Chronic diastolic CHF (congestive heart failure) ([place]) 07/28/2017  . Influenza with respiratory manifestation 12/01/2016  . Respiratory distress 11/23/2016  . Urinary incontinence in female 09/02/2016  . Vision loss of right eye 08/26/2016  . Blindness of right eye 08/26/2016  . Inability to ambulate due to hip 10/12/2015  . Colostomy in place [facility]) 10/04/2015  . Long term current use of anticoagulant therapy 10/03/2015  . Leukocytosis 09/25/2015  . CAP (community acquired pneumonia) 09/24/2015  . COPD (chronic obstructive pulmonary disease) ([place]) 09/24/2015  . UTI (urinary tract infection) 07/16/2015  . Abdominal wall cellulitis 07/15/2015  . DVT (deep venous thrombosis) ([place]) 07/15/2015  . HTN (hypertension) 07/15/2015  . Polycythemia vera ([place]) 07/15/2015  . Arthritis 07/15/2015  . Pulmonary emboli ([place]) 05/25/2015  . Diverticulosis of colon with hemorrhage 04/02/2015  . Diverticulosis of intestine with bleeding 04/02/2015  . Fall 01/10/2015  . Osteoarthritis 01/10/2015  . Hammertoe 09/02/2012  . S/P transmetatarsal amputation of foot ([place]) 09/02/2012  . Onychomycosis 09/02/2012  . Other specified dermatoses 09/02/2012  . S/P hip replacement 08/23/2012  . Glenohumeral arthritis 06/28/2012    Past Surgical History:  Procedure Laterality Date  . ABDOMINAL HYSTERECTOMY    . BLEPHAROPLASTY Right 03/2017   right upper eyelid  . CARDIAC CATHETERIZATION Right 04/03/2015   Procedure: CENTRAL LINE INSERTION;  Surgeon: Sherri Rad,  MD;  Location: [facility] ORS;  Service: General;  Laterality: Right;  . COLECTOMY WITH COLOSTOMY CREATION/HARTMANN PROCEDURE N/A 04/03/2015   Procedure: COLECTOMY WITH COLOSTOMY CREATION/HARTMANN PROCEDURE;  Surgeon: Sherri Rad, MD;  Location: [facility] ORS;  Service: General;  Laterality: N/A;  .  COLON SURGERY    . FOOT AMPUTATION     partial  . FOOT AMPUTATION Right   . JOINT REPLACEMENT     Left and Right Hip  . PERIPHERAL VASCULAR CATHETERIZATION N/A 04/02/2015   Procedure: Visceral Angiography;  Surgeon: Algernon Huxley, MD;  Location: [place] [facility];  Service: Cardiovascular;  Laterality: N/A;  . PERIPHERAL VASCULAR CATHETERIZATION N/A 04/02/2015   Procedure: Visceral Artery Intervention;  Surgeon: Algernon Huxley, MD;  Location: [place] [facility];  Service: Cardiovascular;  Laterality: N/A;  . PERIPHERAL VASCULAR CATHETERIZATION N/A 05/25/2015   Procedure: IVC Filter Insertion;  Surgeon: Katha Cabal, MD;  Location: [place] [facility];  Service: Cardiovascular;  Laterality: N/A;  . TOE AMPUTATION     right  . TOTAL HIP ARTHROPLASTY      Prior to Admission medications   Medication Sig Start Date End Date Taking? Authorizing Provider  amLODipine (NORVASC) 5 MG tablet Take 5 mg by mouth daily.   Yes [provider]  apixaban (ELIQUIS) 5 MG TABS tablet Take 1 tablet (5 mg total) by mouth 2 (two) times daily. 03/17/18  Yes Karen Kitchens, NP  aspirin EC 81 MG tablet Take 1 tablet (81 mg total) by mouth daily. 09/16/15  Yes [name], MD  docusate sodium (COLACE) 100 MG capsule Take 100 mg by mouth 2 (two) times daily.    Yes [provider]  ENSURE (ENSURE) Take 1 Can by mouth 3 (three) times daily with meals.   Yes [provider]  folic acid (FOLVITE) 086 MCG tablet Take 400 mcg by mouth daily.   Yes [provider]  furosemide (LASIX) 20 MG tablet Take 1 tablet (20 mg total) by mouth daily. 03/10/18  Yes Hackney, Tina A, FNP  hydroxyurea (HYDREA) 500 MG capsule Take 2 capsules (1,000 mg total) by mouth daily. Patient taking differently: Take 500 mg by mouth daily.  03/03/18  Yes Karen Kitchens, NP  ipratropium-albuterol (DUONEB) 0.5-2.5 (3) MG/3ML SOLN Take 3 mLs by nebulization every 4 (four) hours as needed.   Yes [provider]  JAKAFI 15 MG tablet TAKE 1 TABLET (15 MG TOTAL) BY MOUTH 2 TIMES DAILY. 05/07/18  Yes Karen Kitchens, NP  polyethylene glycol powder (GLYCOLAX/MIRALAX) powder 1 cap full in a full glass of water, once daily for 7 days. 04/13/18  Yes Carrie Mew, MD  potassium chloride (K-DUR,KLOR-CON) 10 MEQ tablet Take 10 mEq by mouth as needed. TAKE WITH LASIX PRN   Yes [provider]  ciprofloxacin (CIPRO) 500 MG tablet Take 1 tablet (500 mg total) by mouth 2 (two) times daily. 05/18/18   Michela Herst B, FNP  predniSONE (DELTASONE) 10 MG tablet Take 5 tablets (50 mg total) by mouth daily. 05/18/18   Victorino Dike, FNP    Allergies Sulfamethoxazole-trimethoprim  Family History  Problem Relation Age of Onset  . Brain cancer Mother   . Heart attack Father   . COPD Father   . Coronary artery disease Father   . Hypertension Unknown   . Arthritis-Osteo Unknown   . Diabetes Brother   . Arthritis Brother   . Osteosarcoma Son   . Bone cancer Son   .  Arthritis Sister   . Arthritis Brother   . Hypertension Brother   . Coronary artery disease Brother     Social History Social History   Tobacco Use  . Smoking status: Never Smoker  . Smokeless tobacco: Never Used  Substance Use Topics  . Alcohol use: No  . Drug use: No    Review of Systems  Constitutional: No fever/chills Eyes: No visual changes. ENT: No sore throat. Cardiovascular: Denies chest pain. Respiratory: Denies shortness of breath. Gastrointestinal: No abdominal pain.  No nausea, no vomiting.  No diarrhea.  No constipation. Genitourinary: Negative for dysuria. Musculoskeletal: Negative for back pain. Skin: Negative for rash. Neurological: Negative for headaches, focal weakness or numbness. ____________________________________________   PHYSICAL EXAM:  VITAL SIGNS: ED Triage Vitals  Enc Vitals Group     BP 05/17/18 1900 (!) 167/82     Pulse Rate 05/17/18 1900 84     Resp 05/17/18 1900 14      Temp 05/17/18 1900 98.3 F (36.8 C)     Temp Source 05/17/18 1900 Oral     SpO2 05/17/18 1900 92 %     Weight 05/17/18 1859 180 lb (81.6 kg)     Height 05/17/18 1859 5\' 7"  (1.702 m)     Head Circumference --      Peak Flow --      Pain Score 05/17/18 1858 0     Pain Loc --      Pain Edu? --      Excl. in [place]? --    Constitutional: Alert and oriented. Well appearing and in no acute distress. Eyes: Conjunctivae are normal. PERRL. EOMI. Head: Atraumatic. Nose: No congestion/rhinnorhea. Mouth/Throat: Mucous membranes are moist.  Oropharynx non-erythematous. Neck: No stridor.   Cardiovascular: Normal rate, regular rhythm. Grossly normal heart sounds.  Good peripheral circulation. Respiratory: Normal respiratory effort.  No retractions. Lungs CTAB. Gastrointestinal: Soft and nontender. No distention. No abdominal bruits. No CVA tenderness. Musculoskeletal: No lower extremity tenderness nor edema.  No joint effusions. Neurologic:  Normal speech and language. No gross focal neurologic deficits are appreciated. No gait instability. Skin:  Skin is warm, dry and intact. No rash noted. Psychiatric: Mood and affect are normal. Speech and behavior are normal.  ____________________________________________   LABS (all labs ordered are listed, but only abnormal results are displayed)  Labs Reviewed  TROPONIN I - Abnormal; Notable for the following components:      Result Value   Troponin I 0.03 (*)    All other components within normal limits  BASIC METABOLIC PANEL - Abnormal; Notable for the following components:   Glucose, Bld 102 (*)    All other components within normal limits  URINALYSIS, COMPLETE (UACMP) WITH MICROSCOPIC - Abnormal; Notable for the following components:   Color, Urine AMBER (*)    APPearance TURBID (*)    Hgb urine dipstick SMALL (*)    Nitrite POSITIVE (*)    Leukocytes, UA MODERATE (*)    WBC, UA >50 (*)    Bacteria, UA MANY (*)    All other components within  normal limits  HEPATIC FUNCTION PANEL - Abnormal; Notable for the following components:   Albumin 3.0 (*)    All other components within normal limits  PROTIME-INR - Abnormal; Notable for the following components:   Prothrombin Time 22.0 (*)    All other components within normal limits  CBC WITH DIFFERENTIAL/PLATELET - Abnormal; Notable for the following components:   WBC 11.4 (*)    RBC 3.19 (*)  Hemoglobin 8.6 (*)    HCT 26.8 (*)    RDW 37.8 (*)    Platelets 551 (*)    nRBC 15 (*)    Neutro Abs 7.8 (*)    All other components within normal limits  TROPONIN I - Abnormal; Notable for the following components:   Troponin I 0.03 (*)    All other components within normal limits  URINE CULTURE  BRAIN NATRIURETIC PEPTIDE  LACTIC ACID, PLASMA  CBC WITH DIFFERENTIAL/PLATELET   ____________________________________________  EKG  Sinus rhythm; Right bundle branch block; No STEMI; No axis deviation. ____________________________________________  RADIOLOGY  ED MD interpretation: Chest x-ray is negative for acute findings.  Official radiology report(s): Dg Chest 1 View  Result Date: 05/17/2018 CLINICAL DATA:  Shortness of breath. EXAM: CHEST  1 VIEW COMPARISON:  Chest radiograph Apr 06, 2018 FINDINGS: Stable cardiomegaly. Annular calcifications. Calcified aortic knob. Mild bronchitic changes without pleural effusion or focal consolidation. Persistently elevated RIGHT hemidiaphragm. No pneumothorax. Scoliosis. IMPRESSION: Stable cardiomegaly and chronic bronchitic changes. Aortic Atherosclerosis (ICD10-I70.0). Electronically Signed   By: Elon Alas M.D.   On: 05/17/2018 20:06   Ct Angio Chest Pe W And/or Wo Contrast  Result Date: 05/18/2018 CLINICAL DATA:  Shortness of breath. EXAM: CT ANGIOGRAPHY CHEST WITH CONTRAST TECHNIQUE: Multidetector CT imaging of the chest was performed using the standard protocol during bolus administration of intravenous contrast. Multiplanar CT image  reconstructions and MIPs were obtained to evaluate the vascular anatomy. CONTRAST:  59mL OMNIPAQUE IOHEXOL 350 MG/ML SOLN COMPARISON:  02/23/2018 FINDINGS: Cardiovascular: No evidence of acute pulmonary embolism. Dilated main pulmonary artery as with pulmonary hypertension, 4.5 cm in diameter. Mitral annular calcification. Tortuous aorta without acute finding. Cardiomegaly without pericardial effusion. Mediastinum/Nodes: Negative for adenopathy.  Multinodular goiter. Lungs/Pleura: Chronic lower lobe scarring and volume loss, worse on the right where there is diaphragm elevation. There is no edema, consolidation, effusion, or pneumothorax. Upper Abdomen: Right renal cystic densities. IVC filter, cholelithiasis, and atherosclerosis. Musculoskeletal: Advanced spondylosis with multi-level thoracic ankylosis. Severe bilateral glenohumeral osteoarthritis. Review of the MIP images confirms the above findings. IMPRESSION: 1. No acute finding. 2. Findings of pulmonary hypertension. 3. Chronic lower lobe scarring. 4. Cholelithiasis. 5. Aortic Atherosclerosis (ICD10-I70.0). Electronically Signed   By: Monte Fantasia M.D.   On: 05/18/2018 01:08    ____________________________________________   PROCEDURES  Procedure(s) performed: None  Procedures  Critical Care performed: No  ____________________________________________   INITIAL IMPRESSION / ASSESSMENT AND PLAN / ED COURSE  As part of my medical decision making, I reviewed the following data within the electronic MEDICAL RECORD NUMBER Notes from prior ED visits   84 year old female presenting to the emergency department for treatment and evaluation of shortness of breath which resolved in route to the hospital after she was placed on 2 L of oxygen by nasal cannula.  While here, CT angiogram of the chest ruled out an acute PE.  Troponin appears to be chronically elevated at 0.03.  Urinalysis is consistent with a urinary tract infection for which she will be  given ciprofloxacin.  Patient is on room air and the oxygen saturation is consistently 100%.  She is normotensive and heart rate is consistently around 64 bpm.  She will be treated for her UTI and given a burst of prednisone then discharged home to follow-up with closely with her primary care provider this week.  She was instructed to return to the emergency department for symptoms of change or worsen if she is unable to schedule an appointment.  ___________________________________________   FINAL CLINICAL IMPRESSION(S) / ED DIAGNOSES  Final diagnoses:  SOB (shortness of breath)  Acute cystitis without hematuria     ED Discharge Orders        Ordered    predniSONE (DELTASONE) 10 MG tablet  Daily     05/18/18 0202    ciprofloxacin (CIPRO) 500 MG tablet  2 times daily     05/18/18 0202       Note:  This document was prepared using Dragon voice recognition software and may include unintentional dictation errors.    Victorino Dike, FNP 05/18/18 8315    Eula Listen, MD 05/18/18 [phone]

## 2018-05-17 NOTE — ED Notes (Signed)
Called and spoke with [name] in [place]. She stated that earlier they had a power flicker and the machine was overheated. Once it is up and working then they have one patient ahead of this patient.

## 2018-05-17 NOTE — ED Notes (Signed)
IV attempted 2 with no success. Another RN to try at this time.

## 2018-05-17 NOTE — ED Notes (Signed)
Pt cleaned up and changed at this time.

## 2018-05-17 NOTE — ED Notes (Signed)
Yolanda Wells in lab to come and attempt venipuncture for PT/INR

## 2018-05-17 NOTE — ED Notes (Signed)
Butch RN at bedside attempting IV ultrasound

## 2018-05-17 NOTE — ED Triage Notes (Signed)
Pt comes via [facility] from home with c/o breathing difficulties. Pt states she has had some SHOB and tried inhalers with no improvement. Pt denies chest pain, N/V and any pain at all. Pt states she wears 2L at home when needed.

## 2018-05-17 NOTE — ED Notes (Signed)
RN Sherrie attempted IV draw with no success at the same time

## 2018-05-17 NOTE — ED Notes (Signed)
2 unsuccessful US-guided PIV attempts by this RN (left AC and anterior left forearm).

## 2018-05-17 NOTE — ED Notes (Addendum)
[name] granddaughter called for update on patient.  Please call  [phone]  for updates

## 2018-05-17 NOTE — ED Notes (Signed)
CT angio clicked off by accident. Test not performed yet

## 2018-05-17 NOTE — ED Provider Notes (Signed)
I saw and evaluated this patient with Rollene Fare.  84 year old female with a history of COPD with several days of progressively worsening shortness of breath.  The patient does have a history of DVT and PE, and is on Eliquis.  She is hemodynamically stable with O2 sats of 98%.  Her work-up in the emergency department is reassuring so far, CBC and CT to rule out PE are pending.  The patient's EKG has a poor baseline tracing but has no evidence of severe ischemia and her troponin is 0.03; her troponin will be repeated.  On my exam, patient is chronically ill-appearing, with reassuring vital signs.  She does have some mild end expiratory wheezing and we will treat her with a DuoNeb.  The patient is mentating normally.   Plan final disposition after the remainder of her studies are complete.   Eula Listen, MD 05/17/18 2209

## 2018-05-17 NOTE — ED Triage Notes (Signed)
Pt to ED via EMS from home c/o difficulty breathing x1 week getting worse.  Pt 94% RA for EMS, placed on 2L [place] up to 100%, CO2 35.  Pt using breathing treatments at home with some relief.

## 2018-05-17 NOTE — ED Notes (Signed)
Yolanda Wells pt's granddaughter called and was updated on pt's status and procedures that are waiting to be completed at this time.

## 2018-05-17 NOTE — ED Notes (Signed)
IV team at bedside 

## 2018-05-18 DIAGNOSIS — R0602 Shortness of breath: Secondary | ICD-10-CM | POA: Diagnosis not present

## 2018-05-18 MED ORDER — PREDNISONE 10 MG PO TABS: 50.0000 mg | ORAL_TABLET | Freq: Every day | ORAL | 0 refills | Status: DC

## 2018-05-18 MED ORDER — CIPROFLOXACIN HCL 500 MG PO TABS
500.0000 mg | ORAL_TABLET | Freq: Once | ORAL | Status: AC
  Administered 2018-05-18: 500 mg via ORAL
  Filled 2018-05-18: qty 1

## 2018-05-18 MED ORDER — CIPROFLOXACIN HCL 500 MG PO TABS: 500.0000 mg | ORAL_TABLET | Freq: Two times a day (BID) | ORAL | 0 refills | Status: DC

## 2018-05-18 MED ORDER — PREDNISONE 20 MG PO TABS
60.0000 mg | ORAL_TABLET | Freq: Once | ORAL | Status: AC
  Administered 2018-05-18: 60 mg via ORAL
  Filled 2018-05-18: qty 3

## 2018-05-18 NOTE — Discharge Instructions (Signed)
Please call your primary care provider tomorrow and schedule an appointment.  Take your medications as prescribed.  Return to the emergency department for symptoms of change or worsen if you are unable to schedule an appointment with a primary care provider.

## 2018-05-18 NOTE — ED Notes (Signed)
Patient discharged home via [facility]. Verbal consent for discharge and transportation given by Bea , POA. EMS sent with patient's belonging and discharge papers and instructions.

## 2018-05-18 NOTE — ED Notes (Signed)
Pt's granddaughter and POA, Bea Laura, called.  Pt confirmed permission to release information.  Ms Nyoka Cowden updated on pt status.

## 2018-05-19 ENCOUNTER — Encounter: Payer: Self-pay | Admitting: Hematology and Oncology

## 2018-05-20 ENCOUNTER — Other Ambulatory Visit: Payer: Self-pay

## 2018-05-20 ENCOUNTER — Emergency Department
Admission: EM | Admit: 2018-05-20 | Discharge: 2018-05-20 | Disposition: A | Discharge status: Home / Self Care | Payer: Medicare Other | Attending: Emergency Medicine | Admitting: Emergency Medicine

## 2018-05-20 ENCOUNTER — Encounter: Payer: Self-pay | Admitting: Emergency Medicine

## 2018-05-20 ENCOUNTER — Emergency Department: Payer: Medicare Other

## 2018-05-20 DIAGNOSIS — I110 Hypertensive heart disease with heart failure: Secondary | ICD-10-CM | POA: Insufficient documentation

## 2018-05-20 DIAGNOSIS — S5001XA Contusion of right elbow, initial encounter: Secondary | ICD-10-CM | POA: Diagnosis not present

## 2018-05-20 DIAGNOSIS — Z79899 Other long term (current) drug therapy: Secondary | ICD-10-CM | POA: Diagnosis not present

## 2018-05-20 DIAGNOSIS — M79601 Pain in right arm: Secondary | ICD-10-CM | POA: Diagnosis not present

## 2018-05-20 DIAGNOSIS — T148XXA Other injury of unspecified body region, initial encounter: Secondary | ICD-10-CM

## 2018-05-20 DIAGNOSIS — Y9389 Activity, other specified: Secondary | ICD-10-CM | POA: Diagnosis not present

## 2018-05-20 DIAGNOSIS — J449 Chronic obstructive pulmonary disease, unspecified: Secondary | ICD-10-CM | POA: Insufficient documentation

## 2018-05-20 DIAGNOSIS — X58XXXA Exposure to other specified factors, initial encounter: Secondary | ICD-10-CM | POA: Diagnosis not present

## 2018-05-20 DIAGNOSIS — Y929 Unspecified place or not applicable: Secondary | ICD-10-CM | POA: Insufficient documentation

## 2018-05-20 DIAGNOSIS — Y999 Unspecified external cause status: Secondary | ICD-10-CM | POA: Diagnosis not present

## 2018-05-20 DIAGNOSIS — Z7982 Long term (current) use of aspirin: Secondary | ICD-10-CM | POA: Diagnosis not present

## 2018-05-20 DIAGNOSIS — S59901A Unspecified injury of right elbow, initial encounter: Secondary | ICD-10-CM | POA: Diagnosis present

## 2018-05-20 DIAGNOSIS — R609 Edema, unspecified: Secondary | ICD-10-CM

## 2018-05-20 DIAGNOSIS — I5032 Chronic diastolic (congestive) heart failure: Secondary | ICD-10-CM | POA: Insufficient documentation

## 2018-05-20 DIAGNOSIS — R2231 Localized swelling, mass and lump, right upper limb: Secondary | ICD-10-CM | POA: Diagnosis not present

## 2018-05-20 LAB — URINE CULTURE

## 2018-05-20 NOTE — Discharge Instructions (Addendum)
Your ultrasound does not show any blood clots or other serious complications of the recent IV. Keep the area wrapped tightly for the next 4 hours to ensure there is no additional bleeding under the skin.  Apply a heating pad to help remove the bruising and pain.  Take Tylenol as needed.

## 2018-05-20 NOTE — ED Notes (Signed)
[facility] here to take pt home. Notified grand daughter [name] that pt is on the way home.

## 2018-05-20 NOTE — ED Notes (Signed)
Grandaughter Bessies cell is [phone].

## 2018-05-20 NOTE — ED Notes (Signed)
Pt adamant with this RN refusing to be stuck for blood work. Pt states "they stuck me so many times the time I was here, you're not going to get it". This RN attempted to explained to patient that blood work would be needed for evaluation by the doctor, pt repeatedly refused to this RN, stated "they need some doctors up here because all y'all do is poke and poke". This RN asked final time about attempting for blood work, pt states, "no".

## 2018-05-20 NOTE — ED Provider Notes (Signed)
[facility] Emergency Department Provider Note  ____________________________________________  Time seen: Approximately 3:26 PM  I have reviewed the triage vital signs and the nursing notes.   HISTORY  Chief Complaint Arm Pain    HPI Yolanda Wells is a 84 y.o. female with a history of DVT, pulmonary embolism, collagen vascular disease, on chronic Eliquis, who complains of pain and bruising in the right elbow.  She recently had a IV placed in the right elbow due to visit to the emergency department a few days ago.  Since then she has had gradual swelling in that area.  Denies pain coldness or weakness in the hand or forearm.  No trauma.  No chest pain shortness of breath fevers chills or sweats.  The pain in the elbow is constant, aching, moderate intensity, nonradiating, worse with movement, no alleviating factors.      Past Medical History:  Diagnosis Date  . Acute deep vein thrombosis (DVT) of distal vein of left lower extremity ([place]) 10/03/2015  . Acute pulmonary embolism ([place]) 10/03/2015  . Anticoagulant long-term use   . Arthritis   . CHF (congestive heart failure) ([place])   . Chronic anticoagulation 10/03/2015  . Collagen vascular disease ([place])   . Colostomy in place [facility]) 10/04/2015  . Deep venous thrombosis (H[facility])    right lower extremity  . Dependent edema   . Diverticulosis of intestine with bleeding 04/02/2015  . Glenohumeral arthritis 06/28/2012  . Hammertoe 09/02/2012  . History of bilateral hip replacements 09/02/2012  . History of hysterectomy   . Hypertension   . Hypokalemia   . Inability to ambulate due to hip 10/12/2015  . Mandibular dysfunction   . Onychomycosis 09/02/2012  . Osteoarthritis   . Polycythemia vera ([place])   . Presence of IVC filter 05/25/2015  . 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 Urinary incontinence in female 09/02/2016     Patient Active Problem List   Diagnosis Date Noted  . Secondary myelofibrosis  ([place]) 02/17/2018  . Goals of care, counseling/discussion 12/23/2017  . Palliative care by specialist   . DNR (do not resuscitate) discussion   . Weakness generalized   . Chronic diastolic CHF (congestive heart failure) ([place]) 07/28/2017  . Influenza with respiratory manifestation 12/01/2016  . Respiratory distress 11/23/2016  . Urinary incontinence in female 09/02/2016  . Vision loss of right eye 08/26/2016  . Blindness of right eye 08/26/2016  . Inability to ambulate due to hip 10/12/2015  . Colostomy in place [facility]) 10/04/2015  . Long term current use of anticoagulant therapy 10/03/2015  . Leukocytosis 09/25/2015  . CAP (community acquired pneumonia) 09/24/2015  . COPD (chronic obstructive pulmonary disease) ([place]) 09/24/2015  . UTI (urinary tract infection) 07/16/2015  . Abdominal wall cellulitis 07/15/2015  . DVT (deep venous thrombosis) ([place]) 07/15/2015  . HTN (hypertension) 07/15/2015  . Polycythemia vera ([place]) 07/15/2015  . Arthritis 07/15/2015  . Pulmonary emboli ([place]) 05/25/2015  . Diverticulosis of colon with hemorrhage 04/02/2015  . Diverticulosis of intestine with bleeding 04/02/2015  . Fall 01/10/2015  . Osteoarthritis 01/10/2015  . Hammertoe 09/02/2012  . S/P transmetatarsal amputation of foot ([place]) 09/02/2012  . Onychomycosis 09/02/2012  . Other specified dermatoses 09/02/2012  . S/P hip replacement 08/23/2012  . Glenohumeral arthritis 06/28/2012     Past Surgical History:  Procedure Laterality Date  . ABDOMINAL HYSTERECTOMY    . BLEPHAROPLASTY Right 03/2017   right upper eyelid  . CARDIAC CATHETERIZATION Right 04/03/2015   Procedure: CENTRAL LINE INSERTION;  Surgeon: Sherri Rad, MD;  Location: [facility] ORS;  Service: General;  Laterality: Right;  . COLECTOMY WITH COLOSTOMY CREATION/HARTMANN PROCEDURE N/A 04/03/2015   Procedure: COLECTOMY WITH COLOSTOMY CREATION/HARTMANN PROCEDURE;  Surgeon: Sherri Rad, MD;  Location: [facility] ORS;  Service: General;  Laterality: N/A;  .  COLON SURGERY    . FOOT AMPUTATION     partial  . FOOT AMPUTATION Right   . JOINT REPLACEMENT     Left and Right Hip  . PERIPHERAL VASCULAR CATHETERIZATION N/A 04/02/2015   Procedure: Visceral Angiography;  Surgeon: Algernon Huxley, MD;  Location: [place] CV LAB;  Service: Cardiovascular;  Laterality: N/A;  . PERIPHERAL VASCULAR CATHETERIZATION N/A 04/02/2015   Procedure: Visceral Artery Intervention;  Surgeon: Algernon Huxley, MD;  Location: [place] [facility];  Service: Cardiovascular;  Laterality: N/A;  . PERIPHERAL VASCULAR CATHETERIZATION N/A 05/25/2015   Procedure: IVC Filter Insertion;  Surgeon: Katha Cabal, MD;  Location: [place] CV LAB;  Service: Cardiovascular;  Laterality: N/A;  . TOE AMPUTATION     right  . TOTAL HIP ARTHROPLASTY       Prior to Admission medications   Medication Sig Start Date End Date Taking? Authorizing Provider  apixaban (ELIQUIS) 5 MG TABS tablet Take 1 tablet (5 mg total) by mouth 2 (two) times daily. 03/17/18  Yes Karen Kitchens, NP  aspirin EC 81 MG tablet Take 1 tablet (81 mg total) by mouth daily. 09/16/15  Yes [name], MD  ciprofloxacin (CIPRO) 500 MG tablet Take 1 tablet (500 mg total) by mouth 2 (two) times daily. 05/18/18  Yes Triplett, Cari B, FNP  furosemide (LASIX) 20 MG tablet Take 1 tablet (20 mg total) by mouth daily. 03/10/18  Yes Hackney, Tina A, FNP  amLODipine (NORVASC) 5 MG tablet Take 5 mg by mouth daily.    [provider]  docusate sodium (COLACE) 100 MG capsule Take 100 mg by mouth 2 (two) times daily.     [provider]  ENSURE (ENSURE) Take 1 Can by mouth 3 (three) times daily with meals.    [provider]  folic acid (FOLVITE) 502 MCG tablet Take 400 mcg by mouth daily.    [provider]  hydroxyurea (HYDREA) 500 MG capsule Take 2 capsules (1,000 mg total) by mouth daily. Patient taking differently: Take 500 mg by mouth daily.  03/03/18   Karen Kitchens, NP  ipratropium-albuterol  (DUONEB) 0.5-2.5 (3) MG/3ML SOLN Take 3 mLs by nebulization every 4 (four) hours as needed.    [provider]  JAKAFI 15 MG tablet TAKE 1 TABLET (15 MG TOTAL) BY MOUTH 2 TIMES DAILY. 05/07/18   Karen Kitchens, NP  polyethylene glycol powder (GLYCOLAX/MIRALAX) powder 1 cap full in a full glass of water, once daily for 7 days. 04/13/18   Carrie Mew, MD  potassium chloride (K-DUR,KLOR-CON) 10 MEQ tablet Take 10 mEq by mouth as needed. TAKE WITH LASIX PRN    [provider]  predniSONE (DELTASONE) 10 MG tablet Take 5 tablets (50 mg total) by mouth daily. 05/18/18   Victorino Dike, FNP     Allergies Sulfamethoxazole-trimethoprim   Family History  Problem Relation Age of Onset  . Brain cancer Mother   . Heart attack Father   . COPD Father   . Coronary artery disease Father   . Hypertension Unknown   . Arthritis-Osteo Unknown   . Diabetes Brother   . Arthritis Brother   . Osteosarcoma Son   .  Bone cancer Son   . Arthritis Sister   . Arthritis Brother   . Hypertension Brother   . Coronary artery disease Brother     Social History Social History   Tobacco Use  . Smoking status: Never Smoker  . Smokeless tobacco: Never Used  Substance Use Topics  . Alcohol use: No  . Drug use: No    Review of Systems  Constitutional:   No fever or chills.  Cardiovascular:   No chest pain or syncope. Respiratory:   No dyspnea or cough. Gastrointestinal:   Negative for abdominal pain, vomiting and diarrhea.  Musculoskeletal: Right elbow swelling as above All other systems reviewed and are negative except as documented above in ROS and HPI.  ____________________________________________   PHYSICAL EXAM:  VITAL SIGNS: ED Triage Vitals  Enc Vitals Group     BP 05/20/18 1205 (!) 146/79     Pulse Rate 05/20/18 1205 86     Resp 05/20/18 1205 20     Temp 05/20/18 1205 97.6 F (36.4 C)     Temp Source 05/20/18 1205 Oral     SpO2 05/20/18 1205 98 %     Weight 05/20/18  1206 180 lb (81.6 kg)     Height 05/20/18 1206 5\' 7"  (1.702 m)     Head Circumference --      Peak Flow --      Pain Score 05/20/18 1206 9     Pain Loc --      Pain Edu? --      Excl. in [place]? --     Vital signs reviewed, nursing assessments reviewed.   Constitutional:   Alert and oriented. Non-toxic appearance. Eyes:   Conjunctivae are normal. EOMI. ENT      Head:   Normocephalic and atraumatic.      Nose:   No congestion/rhinnorhea.  No epistaxis      Mouth/Throat:   MMM, no pharyngeal erythema. No peritonsillar mass.       Neck:   No meningismus. Full ROM. Hematological/Lymphatic/Immunilogical:   No cervical lymphadenopathy. Cardiovascular:   RRR. Symmetric bilateral radial and DP pulses.  No murmurs.  Respiratory:   Normal respiratory effort without tachypnea/retractions. Breath sounds are clear and equal bilaterally. No wheezes/rales/rhonchi. Gastrointestinal:   Soft and nontender. Non distended. There is no CVA tenderness.  No rebound, rigidity, or guarding. Musculoskeletal:   Normal range of motion in all extremities. No joint effusions.  No lower extremity tenderness.  No lower extremity edema.  There is ecchymosis over the right antecubital fossa with some swelling in the area.  Compartments are soft.  No inflammatory changes.  Mild tenderness in the anti-cubital fossa. Neurologic:   Normal speech and language.  Motor grossly intact. No acute focal neurologic deficits are appreciated.  Skin:    Skin is warm, dry and intact.  Bruising as noted above.  No rash.  No petechia. ____________________________________________    LABS (pertinent positives/negatives) (all labs ordered are listed, but only abnormal results are displayed) Labs Reviewed - No data to display ____________________________________________   EKG    ____________________________________________    RADIOLOGY  US Venous Img Upper Uni Right  Result Date: 05/20/2018 CLINICAL DATA:  Right upper  extremity swelling. EXAM: Right UPPER EXTREMITY VENOUS DOPPLER ULTRASOUND TECHNIQUE: Gray-scale sonography with graded compression, as well as color Doppler and duplex ultrasound were performed to evaluate the upper extremity deep venous system from the level of the subclavian vein and including the jugular, axillary, basilic, radial, ulnar and  upper cephalic vein. Spectral Doppler was utilized to evaluate flow at rest and with distal augmentation maneuvers. COMPARISON:  None. FINDINGS: Contralateral Subclavian Vein: Respiratory phasicity is normal and symmetric with the symptomatic side. No evidence of thrombus. Normal compressibility. Internal Jugular Vein: No evidence of thrombus. Normal compressibility, respiratory phasicity and response to augmentation. Subclavian Vein: No evidence of thrombus. Normal compressibility, respiratory phasicity and response to augmentation. Axillary Vein: No evidence of thrombus. Normal compressibility, respiratory phasicity and response to augmentation. Cephalic Vein: Not well visualized. Basilic Vein: Not well visualized. Brachial Veins: No evidence of thrombus. Normal compressibility, respiratory phasicity and response to augmentation. Radial Veins: No evidence of thrombus. Normal compressibility, respiratory phasicity and response to augmentation. Ulnar Veins: No evidence of thrombus. Normal compressibility, respiratory phasicity and response to augmentation. Venous Reflux:  None visualized. Other Findings:  None visualized. IMPRESSION: No definite evidence of DVT within the right upper extremity. However, the right basilic and cephalic veins are not well visualized and thrombus in these structures cannot be excluded. Electronically Signed   By: Marijo Conception, M.D.   On: 05/20/2018 14:39    ____________________________________________   PROCEDURES Procedures  ____________________________________________    CLINICAL IMPRESSION / ASSESSMENT AND PLAN / ED  COURSE  Pertinent labs & imaging results that were available during my care of the patient were reviewed by me and considered in my medical decision making (see chart for details).    Patient presents with swelling and bruising in the area where she recently had an IV placed in the right antecubital fossa related to her Eliquis use.  Peers to be infiltration, no evidence of compartment syndrome abscess cellulitis or infection.  I doubt an arterial injury or pseudoaneurysm or fistula formation.  Ultrasound is negative for DVT.  Suitable for discharge home.  Will provide a compression dressing to the area to ensure no further bleeding.  Follow-up with primary care.      ____________________________________________   FINAL CLINICAL IMPRESSION(S) / ED DIAGNOSES    Final diagnoses:  Swelling  Right arm pain  Superficial bruising     ED Discharge Orders    None      Portions of this note were generated with dragon dictation software. Dictation errors may occur despite best attempts at proofreading.    Carrie Mew, MD 05/20/18 1530

## 2018-05-20 NOTE — ED Notes (Signed)
Called [facility] for transport back to [place]

## 2018-05-20 NOTE — ED Triage Notes (Addendum)
Pt presents to ED via [facility] with c/o R forearm swelling. Pt c/o swelling and pain where previous IV site was, pt with noted swelling to site. Pt denies any medication administration through IV. Pt cries out in pain when site is touched.

## 2018-05-20 NOTE — ED Notes (Signed)
Pt given crackers and water to drink. Awaiting EMS pickup.

## 2018-05-20 NOTE — ED Notes (Signed)
Spoke with [name], pt grand daughter who reports pt will need EMS to take her home.

## 2018-05-20 NOTE — ED Notes (Signed)
This RN spoke with Dr. Cherylann Banas after Kathlee Nations, RN reported that patient's armband was now tight and swelling increasing. VORB for US Venous R upper extremity received from Dr. Cherylann Banas.

## 2018-05-20 NOTE — ED Notes (Signed)
Patient is close to me in the lobby. In [place].  She called me over to ask about going to [facility].  I explained that she needs to see the physician and they would determine if she could go to [facility].  The arm band on right wrist is tight, so I have removed it and placed an armband onher left arm.

## 2018-05-21 NOTE — Progress Notes (Signed)
ED Culture Results   Allergies: Septra Visit Date: 05/17/18 Chief Complaint: SOB, aucte cystitis Culture Type: urine Culture Results >100,000 E.coli resistant to cipro Original Abx given: Cipro Original Abx sensitive, intermediate, or resistant: resistant Recommended Abx: Keflex 500mg  PO bid x 5 days ED Physician: Isaias Cowman Patient: talked with caregiver Prescription Called into: Lorenda Peck, PharmD, BCPS 05/21/2018 5:53 PM

## 2018-06-01 ENCOUNTER — Encounter: Payer: Self-pay | Admitting: Hematology and Oncology

## 2018-06-02 ENCOUNTER — Encounter: Payer: Self-pay | Admitting: Hematology and Oncology

## 2018-06-03 ENCOUNTER — Encounter: Payer: Self-pay | Admitting: Hematology and Oncology

## 2018-06-04 ENCOUNTER — Other Ambulatory Visit: Payer: Self-pay | Admitting: Urgent Care

## 2018-06-04 DIAGNOSIS — D45 Polycythemia vera: Secondary | ICD-10-CM

## 2018-06-04 DIAGNOSIS — R531 Weakness: Secondary | ICD-10-CM

## 2018-06-04 DIAGNOSIS — D649 Anemia, unspecified: Secondary | ICD-10-CM

## 2018-06-04 DIAGNOSIS — E538 Deficiency of other specified B group vitamins: Secondary | ICD-10-CM

## 2018-06-06 NOTE — Progress Notes (Signed)
Yolanda Wells day:  06/06/18  Chief Complaint: Yolanda Wells is a 84 y.o. 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  HPI:   The patient was last seen in the medical oncology [facility] on 04/28/2018.  At that time, patient was doing well overall. She complained of fatigue. She denies increased bruising or bleeding. Exam was stable. Hemoglobin 10.3. Platelets 437,000. Patient continued on Jakafi 15 mg BID.   Patient was seen in the ED on 05/17/2017 by Yolanda Nail, FNP. Notes reviewed. Patient presented via EMS for increased SOB x 1 week despite supplemental oxygen use. EKG showed SR with RBBB; with no ST segment changes. Hemoglobin 8.6, hematocrit 26.8, and platelets 551,000.  Cycled troponins x 2 chronically elevated at 0.03 ng/mL. UA with C&S (+) for > 100,000 CFU/mL E.coli. CT angiography of the chest (-) for acute PE. Patient improved with Duoneb treatments. She was discharged home on a prednisone burst and course of oral ciprofloxacin. She was advised to follow up with her PCP. Of note, urine C&S returned after discharge and demonstrated resistance to the prescribed ciprofloxacin. Patient was contacted on 05/20/2018 and switched to cephelexin 500 mg BID x 5 days.   Patient seen again in the ED on 05/20/2018 by Dr. Carrie Wells. Notes reviewed. Patient with increased pain and swelling to her RIGHT forearm when she previously had PIV placed on 05/17/2018. Patient refused labs due to the multiple IV attempts during her previous visit. RIGHT upper extremity ultrasound revealed no evidence of venous thrombosis. Radiologist issues a caveat that the basilic and cephalic veins were not well visualized, which unfortunately meant that a thrombus could not be fully excluded. ED provided felt that symptoms were related to her apixaban therapy. She was ultimately discharged home in stable condition.   In  the interim, she described being "bored".  Overall, she feels "pretty good".  She is breathing better.  UTI symptoms are gone.  Antibiotics are complete.  She states that she is "never hungry".  She drinks Boost.  Vision remains poor "can't see" out of right eye.   Past Medical History:  Diagnosis Date  . Acute deep vein thrombosis (DVT) of distal vein of left lower extremity ([name]) 10/03/2015  . Acute pulmonary embolism ([name]) 10/03/2015  . Anticoagulant long-term use   . Arthritis   . CHF (congestive heart failure) ([place])   . Chronic anticoagulation 10/03/2015  . Collagen vascular disease ([place])   . Colostomy in place [facility]) 10/04/2015  . Deep venous thrombosis ([facility])    right lower extremity  . Dependent edema   . Diverticulosis of intestine with bleeding 04/02/2015  . Glenohumeral arthritis 06/28/2012  . Hammertoe 09/02/2012  . History of bilateral hip replacements 09/02/2012  . History of hysterectomy   . Hypertension   . Hypokalemia   . Inability to ambulate due to hip 10/12/2015  . Mandibular dysfunction   . Onychomycosis 09/02/2012  . Osteoarthritis   . Polycythemia vera ([name])   . Presence of IVC filter 05/25/2015  . 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 Urinary incontinence in female 09/02/2016    Past Surgical History:  Procedure Laterality Date  . ABDOMINAL HYSTERECTOMY    . BLEPHAROPLASTY Right 03/2017   right upper eyelid  . CARDIAC CATHETERIZATION Right 04/03/2015   Procedure: CENTRAL LINE INSERTION;  Surgeon: Sherri Rad, MD;  Location: [facility] ORS;  Service: General;  Laterality: Right;  .  COLECTOMY WITH COLOSTOMY CREATION/HARTMANN PROCEDURE N/A 04/03/2015   Procedure: COLECTOMY WITH COLOSTOMY CREATION/HARTMANN PROCEDURE;  Surgeon: Sherri Rad, MD;  Location: [facility] ORS;  Service: General;  Laterality: N/A;  . COLON SURGERY    . FOOT AMPUTATION     partial  . FOOT AMPUTATION Right   . JOINT REPLACEMENT     Left and Right Hip  . PERIPHERAL VASCULAR CATHETERIZATION N/A  04/02/2015   Procedure: Visceral Angiography;  Surgeon: Algernon Huxley, MD;  Location: [place] [facility];  Service: Cardiovascular;  Laterality: N/A;  . PERIPHERAL VASCULAR CATHETERIZATION N/A 04/02/2015   Procedure: Visceral Artery Intervention;  Surgeon: Algernon Huxley, MD;  Location: [place] [facility];  Service: Cardiovascular;  Laterality: N/A;  . PERIPHERAL VASCULAR CATHETERIZATION N/A 05/25/2015   Procedure: IVC Filter Insertion;  Surgeon: Katha Cabal, MD;  Location: [place] [facility];  Service: Cardiovascular;  Laterality: N/A;  . TOE AMPUTATION     right  . TOTAL HIP ARTHROPLASTY      Family History  Problem Relation Age of Onset  . Brain cancer Mother   . Heart attack Father   . COPD Father   . Coronary artery disease Father   . Hypertension Unknown   . Arthritis-Osteo Unknown   . Diabetes Brother   . Arthritis Brother   . Osteosarcoma Son   . Bone cancer Son   . Arthritis Sister   . Arthritis Brother   . Hypertension Brother   . Coronary artery disease Brother     Social History:  reports that she has never smoked. She has never used smokeless tobacco. She reports that she does not drink alcohol or use drugs.  She lives alone on [name] at [facility] (independent living).  She moved out of [facility].  She has been living with her grand daughter since 02/06/2017.  Her grand-daughter visits regularly Yolanda Wells [phone]).  The patient is accompanied by her grand daughter, Yolanda Wells, today.   Allergies:  Allergies  Allergen Reactions  . Sulfamethoxazole-Trimethoprim Nausea And Vomiting    Pt states she is not allergic to this medication. Pending removal of entry    Current Medications: Current Outpatient Medications  Medication Sig Dispense Refill  . amLODipine (NORVASC) 5 MG tablet Take 5 mg by mouth daily.    [name] apixaban (ELIQUIS) 5 MG TABS tablet Take 1 tablet (5 mg total) by mouth 2 (two) times daily. 120 tablet 1  . aspirin EC 81 MG tablet  Take 1 tablet (81 mg total) by mouth daily. 30 tablet 2  . docusate sodium (COLACE) 100 MG capsule Take 100 mg by mouth 2 (two) times daily.     [name] ENSURE (ENSURE) Take 1 Can by mouth 3 (three) times daily with meals.    . folic acid (FOLVITE) 106 MCG tablet Take 400 mcg by mouth daily.    . furosemide (LASIX) 20 MG tablet Take 1 tablet (20 mg total) by mouth daily. 30 tablet 6  . ipratropium-albuterol (DUONEB) 0.5-2.5 (3) MG/3ML SOLN Take 3 mLs by nebulization every 4 (four) hours as needed.    . cephALEXin (KEFLEX) 500 MG capsule Take 1 capsule (500 mg total) by mouth every 12 (twelve) hours. 7 capsule 0  . JAKAFI 15 MG tablet TAKE 1 TABLET (15 MG TOTAL) BY MOUTH 2 TIMES DAILY. 60 tablet 1  . potassium chloride (K-DUR,KLOR-CON) 10 MEQ tablet Take 1 tablet (10 mEq total) by mouth daily. T 30 tablet 0   No current facility-administered medications  for this visit.     Review of Systems  Constitutional: Negative.  Negative for diaphoresis, fever, malaise/fatigue and weight loss.  HENT: Negative.   Eyes: Negative for pain and redness.       Decreased vision in the RIGHT eye s/p embolic events.  Respiratory: Positive for shortness of breath (exertional). Negative for cough, hemoptysis and sputum production.   Cardiovascular: Negative.  Negative for chest pain, palpitations, orthopnea, leg swelling and PND.  Gastrointestinal: Negative for abdominal pain, blood in stool, constipation, diarrhea, melena, nausea and vomiting.       Decreased appetite.  Colostomy.  Genitourinary: Negative for dysuria, frequency, hematuria and urgency.       Incontinence; wears briefs. Recurrent UTIs.  Musculoskeletal: Negative for back pain, falls, joint pain and myalgias.  Skin: Negative.  Negative for itching and rash.  Neurological: Negative.  Negative for dizziness, tremors, sensory change, focal weakness, weakness and headaches.  Endo/Heme/Allergies: Bruises/bleeds easily (on apixaban).  Psychiatric/Behavioral:  Negative for depression, memory loss and suicidal ideas. The patient is not nervous/anxious and does not have insomnia.   All other systems reviewed and are negative.  Performance status (ECOG): 3  Vital Signs BP (!) 142/87 (BP Location: Left Arm, Patient Position: Sitting)   Pulse 73   Temp (!) 95.9 F (35.5 C) (Tympanic)   Wt 192 lb 4 oz (87.2 kg)   SpO2 95%   BMI 30.11 kg/m   Physical Exam  Constitutional: She is oriented to person, place, and time and well-developed, well-nourished, and in no distress.  HENT:  Head: Normocephalic and atraumatic.  Gray hair  Eyes: Pupils are equal, round, and reactive to light. EOM are normal. No scleral icterus.  Brown eyes  Neck: Normal range of motion. Neck supple. No tracheal deviation present. No thyromegaly present.  Cardiovascular: Normal rate, regular rhythm and normal heart sounds. Exam reveals no gallop and no friction rub.  No murmur heard. Pulmonary/Chest: Effort normal and breath sounds normal. No respiratory distress. She has no wheezes. She has no rales.  Abdominal: Soft. Bowel sounds are normal. She exhibits no distension. There is no tenderness.  LEFT sided colostomy with brown liquid stool noted.  Musculoskeletal: Normal range of motion. She exhibits edema (chronic BLE). She exhibits no tenderness.  Lymphadenopathy:    She has no cervical adenopathy.    She has no axillary adenopathy.       Right: No inguinal and no supraclavicular adenopathy present.       Left: No inguinal and no supraclavicular adenopathy present.  Neurological: She is alert and oriented to person, place, and time.  Skin: Skin is warm and dry. No rash noted. No erythema.  Psychiatric: Mood, affect and judgment normal.  Nursing note and vitals reviewed.   Orders Only on 06/07/2018  Component Date Value Ref Range Status  . Retic Ct Pct 06/07/2018 3.5* 0.4 - 3.1 % Final  . RBC. 06/07/2018 3.72* 3.80 - 5.20 MIL/uL Final  . Retic Count, Absolute  06/07/2018 130.2  19.0 - 183.0 K/uL Final   Performed at [facility], [address] [place], [place] [address]  . Sed Rate 06/07/2018 49* 0 - 30 mm/hr Final   Performed at [facility], [address] [place], [place] [address]  . Vitamin B-12 06/07/2018 258  180 - 914 pg/mL Final   Comment: (NOTE) This assay is not validated for testing neonatal or myeloproliferative syndrome specimens for Vitamin B12 levels. Performed at [facility], [place] [address]  [place], [place] [address]   . Folate 06/07/2018 34.0  >5.9 ng/mL Final   Performed at [facility], [address] [place], [place] [address]  . TSH 06/07/2018 1.823  0.350 - 4.500 uIU/mL Final   Comment: Performed by a 3rd Generation assay with a functional sensitivity of <=0.01 uIU/mL. Performed at [facility], [address] [place], [place] [address]   . Iron 06/07/2018 47  28 - 170 ug/dL Final  . TIBC 06/07/2018 213* 250 - 450 ug/dL Final  . Saturation Ratios 06/07/2018 22  10.4 - 31.8 % Final  . UIBC 06/07/2018 166  ug/dL Final   Performed at [facility], [address], [place], [place] [address]  . Ferritin 06/07/2018 99  11 - 307 ng/mL Final   Performed at [facility], [address] [place], [place] [address]  . Sodium 06/07/2018 138  135 - 145 mmol/L Final  . Potassium 06/07/2018 3.8  3.5 - 5.1 mmol/L Final  . Chloride 06/07/2018 104  98 - 111 mmol/L Final  . CO2 06/07/2018 26  22 - 32 mmol/L Final  . Glucose, Bld 06/07/2018 131* 70 - 99 mg/dL Final  . BUN 06/07/2018 13  8 - 23 mg/dL Final  . Creatinine, Ser 06/07/2018 0.51  0.44 - 1.00 mg/dL Final  . Calcium 06/07/2018 8.7* 8.9 - 10.3 mg/dL Final  . Total Protein 06/07/2018 7.4  6.5 - 8.1 g/dL Final  . Albumin 06/07/2018 3.1* 3.5 - 5.0 g/dL Final  . AST 06/07/2018 21  15 - 41 U/L Final  . ALT 06/07/2018 8  0 - 44 U/L Final  . Alkaline Phosphatase 06/07/2018 71  38 - 126 U/L Final  . Total  Bilirubin 06/07/2018 0.5  0.3 - 1.2 mg/dL Final  . GFR calc non Af Amer 06/07/2018 >60  >60 mL/min Final  . GFR calc Af Amer 06/07/2018 >60  >60 mL/min Final   Comment: (NOTE) The eGFR has been calculated using the CKD EPI equation. This calculation has not been validated in all clinical situations. eGFR's persistently <60 mL/min signify possible Chronic Kidney Disease.   Georgiann Hahn gap 06/07/2018 8  5 - 15 Final   Performed at [facility], [address] [place], [place] [address]  . WBC 06/07/2018 7.4  4.0 - 10.5 K/uL Final   Comment: RESULT REPEATED AND VERIFIED COUNT CONFIRMED BY SMEAR   . RBC 06/07/2018 3.80  3.80 - 5.20 MIL/uL Final  . Hemoglobin 06/07/2018 9.5* 12.0 - 16.0 g/dL Final  . HCT 06/07/2018 31.0* 35.0 - 47.0 % Final  . MCV 06/07/2018 81.6  80.0 - 100.0 fL Final  . MCH 06/07/2018 24.9* 26.0 - 34.0 pg Final  . MCHC 06/07/2018 30.6* 32.0 - 36.0 g/dL Final  . RDW 06/07/2018 41.8* 11.5 - 14.5 % Final  . Platelets 06/07/2018 361  150 - 400 K/uL Final   Comment: RESULT REPEATED AND VERIFIED PLATELET COUNT CONFIRMED BY SMEAR PLATELETS VARY IN SIZE, LARGE BODY PLATELETS NOTED ON SMEAR   . Neutrophils Relative % 06/07/2018 78  % Final  . Neutro Abs 06/07/2018 5.7  1.4 - 6.5 K/uL Final  . Lymphocytes Relative 06/07/2018 19  % Final  . Lymphs Abs 06/07/2018 1.4  1.0 - 3.6 K/uL Final  . Monocytes Relative 06/07/2018 2  % Final  . Monocytes Absolute 06/07/2018 0.2  0.2 - 0.9 K/uL Final  . Eosinophils Relative 06/07/2018 1  % Final  . Eosinophils Absolute 06/07/2018 0.1  0 - 0.7  K/uL Final  . Basophils Relative 06/07/2018 0  % Final  . Basophils Absolute 06/07/2018 0.0  0 - 0.1 K/uL Final  . RBC Morphology 06/07/2018 MIXED RBC POPULATION   Corrected   Comment: POLYCHROMASIA PRESENT Schistocytes present RARE NRBCs   . Smear Review 06/07/2018 PLATELET COUNT CONFIRMED BY SMEAR   Corrected   Performed at [facility], [address] [place], [place] [address]     Assessment:  Yolanda Wells is a 84 y.o. female African-American woman with JAK2+ polycythemia rubra vera (PV) previously on a phlebotomy program and hydroxyurea. She received P32 in an attempt to manage her counts in 03/2015 and on 05/14/2017.  Bone marrow on 01/07/2018 revealed a hypercellular bone marrow with a myeloproliferative neoplasm.  There was mild to moderate reticulin fibrosis.  Flow cytometry revealed no increased blasts.  Cytogenetics revealed 22, XX, der(15)t(1;15)(q11;p11).  The der 15t(1;15) has been associated with polycythemia, AML, and MDS.  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 She was diagnosed with bilateral lower extremity DVTs on 05/18/2015 and bilateral pulmonary emboli on 05/24/2015. She underwent IVC filter placement on 05/25/2015.  She has been on a fluctuating dose of Coumadin secondary to unstable INR.  She is on a baby aspirin.  She developed progressive erythrocytosis, thrombocytosis, and leukocytosis.  She underwent phlebotomy for a hematocrit of 55.4 on 09/23/2015.  Hematocrit decreased to 50.0, but has again increased after initiation of oral iron.  Platelet count has increased from 1.1 million to 1.4 million.  White count increased from 23,000 - 28,000 to 31,800.  She was admitted on 08/25/2016 with loss of vision in her right eye.  CBC on 08/25/2016 revealed a hematocrit of 40.0, hemoglobin 13.5, MCV 91, platelets 842,000, WBC 4900.  INR was 2.59.  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  MRI and MRA showed no acute changes.  She has been back on hydroxyurea since 10/02/2015.  Initial dose was 1000 mg a day.  Maximum dose was 4 pills/day (total weekly dose: 28 pills).   Hydroxyurea was discontinued on 03/29/2018.  She began Jakafi 10 mg BID on 02/02/2018 (increased to 15 mg BID on 03/10/2018).  She requires  periodic phlebotomies (goal hematocrit <= 42).  Platelet count remains elevated (secondary to PV and likely some component of iron deficiency).  Goal platelet count is 400,000.  She was on Coumadin (discontinued secondary to clot and subtherapeutic INR).  She began Eliquis on 02/05/2018.  She was admitted to [facility] for 2.5 weeks with pneumonia then returned 07/03/2017 - 07/12/2017 with CHF.  She was admitted to [facility] from 09/26/2017 - 10/09/2017 with symptoms of heart failure and an E coli UTI.  She was treated with Lasix and ceftriaxone.  Echo revealed an EF of 60%.  CXR suggested pulmonary artery hypertension.    She was admitted to [facility] from 02/23/2018 - 02/26/2018 with abdominal pain and hypoxia.  She was felt to have decompensated CHF.  She was diuresed and her hypoxia resolved.  Urine culture was + E coli.  She was discharged on a 5 day course of Omnicef.  Symptomatically, she denies any B symptoms.  Exam reveals no adenopathy or hepatosplenomegaly.  Hemoglobin is 9.5.  Platelets are 361,000.  WBC is 7400 ([place] 5700).  Plan: 1. Labs today:  CBC with diff, CMP, ferritin, iron studies, sed rate, TSH, B12, folate, retic. 2. Discuss recent ED visits and recurrent UTIs.  3. Anemia:  Etiology likely multi-factorial.  Additional work up being added today to further assess.   If iron deficiency anemia documented (from prior phlebotomies), begin ferrous sulfate 2x/week. 4. JAK2 (+) polycythemia rubra vera:  Platelets stable at 361,000.   Goal is to maintain a platelet count of < 400,000 and a hematocrit of < 42.   Continue Jakafi 15 mg BID.  5. Anticoagulation therapy:  No significant bruising or bleeding.  Continue apixaban 5 mg BID as previously prescribed.  6. Continue routine lab monitoring with home health. Needs new orders for CBC with diff every 2 weeks to be sent to home health agency.  7. RTC in 4 weeks for MD assessment and labs (CBC with diff, CMP).   Lequita Asal,  MD  06/07/18, 4:35 PM

## 2018-06-07 ENCOUNTER — Inpatient Hospital Stay: Payer: Medicare Other | Attending: Hematology and Oncology | Admitting: Hematology and Oncology

## 2018-06-07 ENCOUNTER — Encounter: Payer: Self-pay | Admitting: Hematology and Oncology

## 2018-06-07 ENCOUNTER — Other Ambulatory Visit: Payer: Self-pay

## 2018-06-07 ENCOUNTER — Other Ambulatory Visit: Payer: Medicare Other

## 2018-06-07 ENCOUNTER — Inpatient Hospital Stay: Payer: Medicare Other

## 2018-06-07 ENCOUNTER — Other Ambulatory Visit: Payer: Self-pay | Admitting: Hematology and Oncology

## 2018-06-07 VITALS — BP 142/87 | HR 73 | Temp 95.9°F | Wt 192.2 lb

## 2018-06-07 DIAGNOSIS — D649 Anemia, unspecified: Secondary | ICD-10-CM

## 2018-06-07 DIAGNOSIS — Z79899 Other long term (current) drug therapy: Secondary | ICD-10-CM

## 2018-06-07 DIAGNOSIS — D7581 Myelofibrosis: Secondary | ICD-10-CM

## 2018-06-07 DIAGNOSIS — R531 Weakness: Secondary | ICD-10-CM

## 2018-06-07 DIAGNOSIS — E538 Deficiency of other specified B group vitamins: Secondary | ICD-10-CM

## 2018-06-07 DIAGNOSIS — D45 Polycythemia vera: Secondary | ICD-10-CM

## 2018-06-07 DIAGNOSIS — Z7901 Long term (current) use of anticoagulants: Secondary | ICD-10-CM

## 2018-06-07 DIAGNOSIS — Z7189 Other specified counseling: Secondary | ICD-10-CM

## 2018-06-07 LAB — COMPREHENSIVE METABOLIC PANEL
ALT: 8 U/L (ref 0–44)
AST: 21 U/L (ref 15–41)
Albumin: 3.1 g/dL — ABNORMAL LOW (ref 3.5–5.0)
Alkaline Phosphatase: 71 U/L (ref 38–126)
Anion gap: 8 (ref 5–15)
BUN: 13 mg/dL (ref 8–23)
CO2: 26 mmol/L (ref 22–32)
Calcium: 8.7 mg/dL — ABNORMAL LOW (ref 8.9–10.3)
Chloride: 104 mmol/L (ref 98–111)
Creatinine, Ser: 0.51 mg/dL (ref 0.44–1.00)
GFR calc Af Amer: >60 mL/min (ref >60)
GFR calc non Af Amer: >60 mL/min (ref >60)
Glucose, Bld: 131 mg/dL — ABNORMAL HIGH (ref 70–99)
Potassium: 3.8 mmol/L (ref 3.5–5.1)
Sodium: 138 mmol/L (ref 135–145)
Total Bilirubin: 0.5 mg/dL (ref 0.3–1.2)
Total Protein: 7.4 g/dL (ref 6.5–8.1)

## 2018-06-07 LAB — CBC WITH DIFFERENTIAL/PLATELET
Basophils Absolute: 0 10*3/uL (ref 0–0.1)
Basophils Relative: 0 %
Eosinophils Absolute: 0.1 10*3/uL (ref 0–0.7)
Eosinophils Relative: 1 %
HCT: 31 % — ABNORMAL LOW (ref 35.0–47.0)
Hemoglobin: 9.5 g/dL — ABNORMAL LOW (ref 12.0–16.0)
Lymphocytes Relative: 19 %
Lymphs Abs: 1.4 10*3/uL (ref 1.0–3.6)
MCH: 24.9 pg — ABNORMAL LOW (ref 26.0–34.0)
MCHC: 30.6 g/dL — ABNORMAL LOW (ref 32.0–36.0)
MCV: 81.6 fL (ref 80.0–100.0)
Monocytes Absolute: 0.2 10*3/uL (ref 0.2–0.9)
Monocytes Relative: 2 %
Neutro Abs: 5.7 10*3/uL (ref 1.4–6.5)
Neutrophils Relative %: 78 %
Platelets: 361 10*3/uL (ref 150–400)
RBC: 3.8 MIL/uL (ref 3.80–5.20)
RDW: 41.8 % — ABNORMAL HIGH (ref 11.5–14.5)
WBC: 7.4 10*3/uL (ref 4.0–10.5)

## 2018-06-07 LAB — RETICULOCYTES
RBC.: 3.72 MIL/uL — ABNORMAL LOW (ref 3.80–5.20)
Retic Count, Absolute: 130.2 10*3/uL (ref 19.0–183.0)
Retic Ct Pct: 3.5 % — ABNORMAL HIGH (ref 0.4–3.1)

## 2018-06-07 LAB — TSH: TSH: 1.823 u[IU]/mL (ref 0.350–4.500)

## 2018-06-07 LAB — SEDIMENTATION RATE: Sed Rate: 49 mm/hr — ABNORMAL HIGH (ref 0–30)

## 2018-06-07 LAB — IRON AND TIBC
IRON: 47 ug/dL (ref 28–170)
Saturation Ratios: 22 % (ref 10.4–31.8)
TIBC: 213 ug/dL — ABNORMAL LOW (ref 250–450)
UIBC: 166 ug/dL

## 2018-06-07 LAB — VITAMIN B12: VITAMIN B 12: 258 pg/mL (ref 180–914)

## 2018-06-07 LAB — FOLATE: Folate: 34 ng/mL (ref >5.9)

## 2018-06-07 LAB — FERRITIN: FERRITIN: 99 ng/mL (ref 11–307)

## 2018-06-07 NOTE — Progress Notes (Signed)
Patient continues to c/o SOB.  Otherwise, offers no complaints.

## 2018-06-09 ENCOUNTER — Ambulatory Visit: Payer: Medicare Other | Admitting: Hematology and Oncology

## 2018-06-09 ENCOUNTER — Other Ambulatory Visit: Payer: Medicare Other

## 2018-06-10 MED FILL — JAKAFI 15 MG TABLET: 15 | 30 days supply | Qty: 60 | Fill #1

## 2018-06-15 ENCOUNTER — Emergency Department: Payer: Medicare Other

## 2018-06-15 ENCOUNTER — Inpatient Hospital Stay
Admission: EM | Admit: 2018-06-15 | Discharge: 2018-06-18 | DRG: 690 | Disposition: A | Discharge status: Skilled Nursing Facility | Payer: Medicare Other | Attending: Internal Medicine | Admitting: Internal Medicine

## 2018-06-15 ENCOUNTER — Other Ambulatory Visit: Payer: Self-pay

## 2018-06-15 ENCOUNTER — Encounter: Payer: Self-pay | Admitting: Emergency Medicine

## 2018-06-15 DIAGNOSIS — D649 Anemia, unspecified: Secondary | ICD-10-CM | POA: Diagnosis present

## 2018-06-15 DIAGNOSIS — Z86718 Personal history of other venous thrombosis and embolism: Secondary | ICD-10-CM | POA: Diagnosis not present

## 2018-06-15 DIAGNOSIS — I5032 Chronic diastolic (congestive) heart failure: Secondary | ICD-10-CM | POA: Diagnosis present

## 2018-06-15 DIAGNOSIS — Z86711 Personal history of pulmonary embolism: Secondary | ICD-10-CM

## 2018-06-15 DIAGNOSIS — D45 Polycythemia vera: Secondary | ICD-10-CM | POA: Diagnosis present

## 2018-06-15 DIAGNOSIS — Z90710 Acquired absence of both cervix and uterus: Secondary | ICD-10-CM | POA: Diagnosis not present

## 2018-06-15 DIAGNOSIS — Z882 Allergy status to sulfonamides status: Secondary | ICD-10-CM | POA: Diagnosis not present

## 2018-06-15 DIAGNOSIS — Z8673 Personal history of transient ischemic attack (TIA), and cerebral infarction without residual deficits: Secondary | ICD-10-CM

## 2018-06-15 DIAGNOSIS — M19019 Primary osteoarthritis, unspecified shoulder: Secondary | ICD-10-CM | POA: Diagnosis present

## 2018-06-15 DIAGNOSIS — R0602 Shortness of breath: Secondary | ICD-10-CM

## 2018-06-15 DIAGNOSIS — R778 Other specified abnormalities of plasma proteins: Secondary | ICD-10-CM

## 2018-06-15 DIAGNOSIS — N3001 Acute cystitis with hematuria: Secondary | ICD-10-CM | POA: Diagnosis present

## 2018-06-15 DIAGNOSIS — Z87440 Personal history of urinary (tract) infections: Secondary | ICD-10-CM

## 2018-06-15 DIAGNOSIS — I110 Hypertensive heart disease with heart failure: Secondary | ICD-10-CM | POA: Diagnosis present

## 2018-06-15 DIAGNOSIS — Z7982 Long term (current) use of aspirin: Secondary | ICD-10-CM | POA: Diagnosis not present

## 2018-06-15 DIAGNOSIS — Z8261 Family history of arthritis: Secondary | ICD-10-CM

## 2018-06-15 DIAGNOSIS — Z95828 Presence of other vascular implants and grafts: Secondary | ICD-10-CM

## 2018-06-15 DIAGNOSIS — R319 Hematuria, unspecified: Secondary | ICD-10-CM

## 2018-06-15 DIAGNOSIS — R7989 Other specified abnormal findings of blood chemistry: Secondary | ICD-10-CM

## 2018-06-15 DIAGNOSIS — Z79899 Other long term (current) drug therapy: Secondary | ICD-10-CM

## 2018-06-15 DIAGNOSIS — B9620 Unspecified Escherichia coli [E. coli] as the cause of diseases classified elsewhere: Secondary | ICD-10-CM | POA: Diagnosis present

## 2018-06-15 DIAGNOSIS — Z933 Colostomy status: Secondary | ICD-10-CM

## 2018-06-15 DIAGNOSIS — R531 Weakness: Secondary | ICD-10-CM | POA: Diagnosis not present

## 2018-06-15 DIAGNOSIS — Z96643 Presence of artificial hip joint, bilateral: Secondary | ICD-10-CM | POA: Diagnosis present

## 2018-06-15 DIAGNOSIS — Z8249 Family history of ischemic heart disease and other diseases of the circulatory system: Secondary | ICD-10-CM | POA: Diagnosis not present

## 2018-06-15 DIAGNOSIS — R32 Unspecified urinary incontinence: Secondary | ICD-10-CM | POA: Diagnosis present

## 2018-06-15 DIAGNOSIS — Z7901 Long term (current) use of anticoagulants: Secondary | ICD-10-CM | POA: Diagnosis not present

## 2018-06-15 DIAGNOSIS — N390 Urinary tract infection, site not specified: Secondary | ICD-10-CM

## 2018-06-15 LAB — COMPREHENSIVE METABOLIC PANEL
ALT: 10 U/L (ref 0–44)
ANION GAP: 9 (ref 5–15)
AST: 28 U/L (ref 15–41)
Albumin: 3.4 g/dL — ABNORMAL LOW (ref 3.5–5.0)
Alkaline Phosphatase: 71 U/L (ref 38–126)
BUN: 10 mg/dL (ref 8–23)
CHLORIDE: 106 mmol/L (ref 98–111)
CO2: 27 mmol/L (ref 22–32)
Calcium: 9 mg/dL (ref 8.9–10.3)
Creatinine, Ser: 0.64 mg/dL (ref 0.44–1.00)
GFR calc non Af Amer: >60 mL/min (ref >60)
Glucose, Bld: 121 mg/dL — ABNORMAL HIGH (ref 70–99)
POTASSIUM: 4.2 mmol/L (ref 3.5–5.1)
SODIUM: 142 mmol/L (ref 135–145)
Total Bilirubin: 0.9 mg/dL (ref 0.3–1.2)
Total Protein: 7.4 g/dL (ref 6.5–8.1)

## 2018-06-15 LAB — CBC WITH DIFFERENTIAL/PLATELET
Band Neutrophils: 0 %
Basophils Absolute: 0 10*3/uL (ref 0–0.1)
Basophils Relative: 0 %
Blasts: 0 %
EOS PCT: 2 %
Eosinophils Absolute: 0.2 10*3/uL (ref 0–0.7)
HCT: 35.2 % (ref 35.0–47.0)
HEMOGLOBIN: 10.5 g/dL — AB (ref 12.0–16.0)
LYMPHS ABS: 1.2 10*3/uL (ref 1.0–3.6)
Lymphocytes Relative: 12 %
MCH: 26.1 pg (ref 26.0–34.0)
MCHC: 29.9 g/dL — AB (ref 32.0–36.0)
MCV: 87.4 fL (ref 80.0–100.0)
MONOS PCT: 2 %
Metamyelocytes Relative: 3 %
Monocytes Absolute: 0.2 10*3/uL (ref 0.2–0.9)
Myelocytes: 2 %
NEUTROS ABS: 8.5 10*3/uL — AB (ref 1.4–6.5)
NEUTROS PCT: 79 %
NRBC: 15 /100{WBCs} — AB
Other: 0 %
Platelets: 661 10*3/uL — ABNORMAL HIGH (ref 150–440)
Promyelocytes Relative: 0 %
RBC: 4.02 MIL/uL (ref 3.80–5.20)
RDW: 31.3 % — ABNORMAL HIGH (ref 11.5–14.5)
WBC: 10.1 10*3/uL (ref 3.6–11.0)

## 2018-06-15 LAB — URINALYSIS, COMPLETE (UACMP) WITH MICROSCOPIC
BILIRUBIN URINE: NEGATIVE
Bacteria, UA: NONE SEEN
Glucose, UA: NEGATIVE mg/dL
Ketones, ur: NEGATIVE mg/dL
NITRITE: POSITIVE — AB
PROTEIN: 30 mg/dL — AB
Specific Gravity, Urine: 1.012 (ref 1.005–1.030)
pH: 6 (ref 5.0–8.0)

## 2018-06-15 LAB — TROPONIN I: Troponin I: 0.03 ng/mL (ref <0.03)

## 2018-06-15 MED ORDER — DEXTROSE 5 % IV SOLN
2.5000 mg/kg | Freq: Once | INTRAVENOUS | Status: AC
  Administered 2018-06-15: 220 mg via INTRAVENOUS
  Filled 2018-06-15: qty 5.5

## 2018-06-15 MED ORDER — ONDANSETRON HCL 4 MG PO TABS: 4.0000 mg | ORAL_TABLET | Freq: Four times a day (QID) | ORAL | Status: DC | PRN

## 2018-06-15 MED ORDER — IPRATROPIUM-ALBUTEROL 0.5-2.5 (3) MG/3ML IN SOLN: 3.0000 mL | RESPIRATORY_TRACT | Status: DC | PRN

## 2018-06-15 MED ORDER — ENSURE PO LIQD: 1.0000 | Freq: Three times a day (TID) | ORAL | Status: DC

## 2018-06-15 MED ORDER — AMLODIPINE BESYLATE 5 MG PO TABS
5.0000 mg | ORAL_TABLET | Freq: Every day | ORAL | Status: DC
  Administered 2018-06-16 – 2018-06-18 (×3): 5 mg via ORAL
  Filled 2018-06-15 (×3): qty 1

## 2018-06-15 MED ORDER — NICOTINE 14 MG/24HR TD PT24: 14.0000 mg | MEDICATED_PATCH | Freq: Every day | TRANSDERMAL | Status: DC

## 2018-06-15 MED ORDER — SODIUM CHLORIDE 0.9 % IV BOLUS
500.0000 mL | Freq: Once | INTRAVENOUS | Status: AC
Start: 2018-06-15 — End: 2018-06-15
  Administered 2018-06-15: 500 mL via INTRAVENOUS

## 2018-06-15 MED ORDER — RUXOLITINIB PHOSPHATE 15 MG PO TABS
15.0000 mg | ORAL_TABLET | Freq: Two times a day (BID) | ORAL | Status: DC
  Administered 2018-06-16 – 2018-06-18 (×5): 15 mg via ORAL
  Filled 2018-06-15 (×8): qty 1

## 2018-06-15 MED ORDER — ACETAMINOPHEN 325 MG PO TABS
650.0000 mg | ORAL_TABLET | Freq: Four times a day (QID) | ORAL | Status: DC | PRN
  Administered 2018-06-17: 650 mg via ORAL
  Filled 2018-06-15: qty 2

## 2018-06-15 MED ORDER — SODIUM CHLORIDE 0.9 % IV SOLN
1.0000 g | Freq: Once | INTRAVENOUS | Status: AC
  Administered 2018-06-15: 1 g via INTRAVENOUS
  Filled 2018-06-15: qty 10

## 2018-06-15 MED ORDER — ONDANSETRON HCL 4 MG/2ML IJ SOLN: 4.0000 mg | Freq: Four times a day (QID) | INTRAMUSCULAR | Status: DC | PRN

## 2018-06-15 MED ORDER — APIXABAN 5 MG PO TABS
5.0000 mg | ORAL_TABLET | Freq: Two times a day (BID) | ORAL | Status: DC
  Administered 2018-06-16 – 2018-06-18 (×5): 5 mg via ORAL
  Filled 2018-06-15 (×5): qty 1

## 2018-06-15 MED ORDER — MEROPENEM 1 G IV SOLR
1.0000 g | Freq: Three times a day (TID) | INTRAVENOUS | Status: DC
Start: 2018-06-15 — End: 2018-06-17
  Administered 2018-06-15 – 2018-06-17 (×5): 1 g via INTRAVENOUS
  Filled 2018-06-15 (×8): qty 1

## 2018-06-15 MED ORDER — ACETAMINOPHEN 650 MG RE SUPP: 650.0000 mg | Freq: Four times a day (QID) | RECTAL | Status: DC | PRN

## 2018-06-15 MED ORDER — ENSURE ENLIVE PO LIQD
237.0000 mL | Freq: Three times a day (TID) | ORAL | Status: DC
  Administered 2018-06-15 – 2018-06-18 (×7): 237 mL via ORAL

## 2018-06-15 MED ORDER — FOLIC ACID 1 MG PO TABS
500.0000 ug | ORAL_TABLET | Freq: Every day | ORAL | Status: DC
  Administered 2018-06-15 – 2018-06-18 (×4): 0.5 mg via ORAL
  Filled 2018-06-15 (×5): qty 1

## 2018-06-15 MED ORDER — FUROSEMIDE 20 MG PO TABS
20.0000 mg | ORAL_TABLET | Freq: Every day | ORAL | Status: DC
Start: 2018-06-15 — End: 2018-06-18
  Administered 2018-06-15 – 2018-06-18 (×4): 20 mg via ORAL
  Filled 2018-06-15 (×4): qty 1

## 2018-06-15 MED ORDER — DOCUSATE SODIUM 100 MG PO CAPS
100.0000 mg | ORAL_CAPSULE | Freq: Two times a day (BID) | ORAL | Status: DC
  Administered 2018-06-16 – 2018-06-18 (×5): 100 mg via ORAL
  Filled 2018-06-15 (×5): qty 1

## 2018-06-15 MED ORDER — ASPIRIN EC 81 MG PO TBEC
81.0000 mg | DELAYED_RELEASE_TABLET | Freq: Every day | ORAL | Status: DC
  Administered 2018-06-16 – 2018-06-18 (×3): 81 mg via ORAL
  Filled 2018-06-15 (×3): qty 1

## 2018-06-15 MED ORDER — ALPRAZOLAM 0.5 MG PO TABS: 0.5000 mg | ORAL_TABLET | Freq: Three times a day (TID) | ORAL | Status: DC | PRN

## 2018-06-15 NOTE — ED Triage Notes (Signed)
Patient to ED from home via [facility]. EMS states they were called out for weakness. Patient's home health aid stated that she was having difficulty transferring from bed to wheelchair which she is normally able to do without much help. Patient having increased weakness for 2-3 days.

## 2018-06-15 NOTE — Progress Notes (Signed)
   [place] [facility] Day: 0 days Yolanda Wells is a 84 y.o. female presenting with Weakness .   Advance care planning discussed with patient. All questions in regards to overall condition and expected prognosis answered.  She states that her granddaughter [name] makes all her decisions.  Patient is alert and oriented.  At this point, she would like to be a full code and is agreeable for CPR, shock and resuscitation if needed.  The decision was made to  Continue current code status  CODE STATUS: Full Code Time spent: 18 minutes

## 2018-06-15 NOTE — Progress Notes (Signed)
Patient's Granddaughter, Jaquala Fuller, called and updated on patient location and status with patient permission. Per Granddaughter who is patient's primary caregiver, she will try an get patient's chemotherapy medication to the hospital as soon as possible.

## 2018-06-15 NOTE — H&P (Signed)
[facility] NAME: Yolanda Wells    MR#:  [id number]  DATE OF BIRTH:  13-Nov-1931  DATE OF ADMISSION:  06/15/2018  PRIMARY CARE PHYSICIAN: [name], MD   REQUESTING/REFERRING PHYSICIAN: Dr. Darel Hong  CHIEF COMPLAINT:   Chief Complaint  Patient presents with  . Weakness    HISTORY OF PRESENT ILLNESS:  Yolanda Wells  is a 84 y.o. female with a known history of polycythemia vera, collagen vascular disease, history of DVT and PE on Eliquis, status post colostomy, frequent admissions for UTI was brought into hospital secondary to worsening weakness. No family is available at bedside, patient is a poor historian.  She lives at home with her granddaughter.  She is not able to walk by herself, has a wheelchair but is able to pivot and use a walker to get into her wheelchair.  Over the last 3 days she has been having chills, increased dysuria and also significant weakness that she is unable to get out of bed without help.  Labs are unremarkable except urine analysis indicating significant infection.  She has had prior resistant organisms in urine including E. coli and also Pseudomonas.  Cultures have been sent and she is being admitted for treatment of the same.  PAST MEDICAL HISTORY:   Past Medical History:  Diagnosis Date  . Acute deep vein thrombosis (DVT) of distal vein of left lower extremity ([place]) 10/03/2015  . Acute pulmonary embolism ([place]) 10/03/2015  . Anticoagulant long-term use   . Arthritis   . CHF (congestive heart failure) ([place])   . Chronic anticoagulation 10/03/2015  . Collagen vascular disease ([place])   . Colostomy in place [facility]) 10/04/2015  . Deep venous thrombosis ([facility])    right lower extremity  . Dependent edema   . Diverticulosis of intestine with bleeding 04/02/2015  . Glenohumeral arthritis 06/28/2012  . Hammertoe 09/02/2012  . History of bilateral hip replacements 09/02/2012  . History of  hysterectomy   . Hypertension   . Hypokalemia   . Inability to ambulate due to hip 10/12/2015  . Mandibular dysfunction   . Onychomycosis 09/02/2012  . Osteoarthritis   . Polycythemia vera ([place])   . Presence of IVC filter 05/25/2015  . Stroke ([place]) 03/26/2015   cerebellar  . Urinary incontinence in female 09/02/2016    PAST SURGICAL HISTORY:   Past Surgical History:  Procedure Laterality Date  . ABDOMINAL HYSTERECTOMY    . BLEPHAROPLASTY Right 03/2017   right upper eyelid  . CARDIAC CATHETERIZATION Right 04/03/2015   Procedure: CENTRAL LINE INSERTION;  Surgeon: Sherri Rad, MD;  Location: [facility] ORS;  Service: General;  Laterality: Right;  . COLECTOMY WITH COLOSTOMY CREATION/HARTMANN PROCEDURE N/A 04/03/2015   Procedure: COLECTOMY WITH COLOSTOMY CREATION/HARTMANN PROCEDURE;  Surgeon: Sherri Rad, MD;  Location: [facility] ORS;  Service: General;  Laterality: N/A;  . COLON SURGERY    . FOOT AMPUTATION     partial  . FOOT AMPUTATION Right   . JOINT REPLACEMENT     Left and Right Hip  . PERIPHERAL VASCULAR CATHETERIZATION N/A 04/02/2015   Procedure: Visceral Angiography;  Surgeon: Algernon Huxley, MD;  Location: [facility];  Service: Cardiovascular;  Laterality: N/A;  . PERIPHERAL VASCULAR CATHETERIZATION N/A 04/02/2015   Procedure: Visceral Artery Intervention;  Surgeon: Algernon Huxley, MD;  Location: [facility];  Service: Cardiovascular;  Laterality: N/A;  . PERIPHERAL VASCULAR CATHETERIZATION N/A 05/25/2015   Procedure: IVC Filter Insertion;  Surgeon:  Katha Cabal, MD;  Location: [facility];  Service: Cardiovascular;  Laterality: N/A;  . TOE AMPUTATION     right  . TOTAL HIP ARTHROPLASTY      SOCIAL HISTORY:   Social History   Tobacco Use  . Smoking status: Never Smoker  . Smokeless tobacco: Never Used  Substance Use Topics  . Alcohol use: No    FAMILY HISTORY:   Family History  Problem Relation Age of Onset  . Brain cancer Mother   . Heart attack  Father   . COPD Father   . Coronary artery disease Father   . Hypertension Unknown   . Arthritis-Osteo Unknown   . Diabetes Brother   . Arthritis Brother   . Osteosarcoma Son   . Bone cancer Son   . Arthritis Sister   . Arthritis Brother   . Hypertension Brother   . Coronary artery disease Brother     DRUG ALLERGIES:   Allergies  Allergen Reactions  . Sulfamethoxazole-Trimethoprim Nausea And Vomiting    Pt states she is not allergic to this medication. Pending removal of entry    REVIEW OF SYSTEMS:   Review of Systems  Constitutional: Positive for malaise/fatigue. Negative for chills, fever and weight loss.  HENT: Positive for hearing loss. Negative for ear discharge, ear pain and nosebleeds.   Eyes: Negative for blurred vision, double vision and photophobia.  Respiratory: Negative for cough, hemoptysis, shortness of breath and wheezing.   Cardiovascular: Negative for chest pain, palpitations, orthopnea and leg swelling.  Gastrointestinal: Negative for abdominal pain, constipation, diarrhea, heartburn, melena, nausea and vomiting.  Genitourinary: Negative for dysuria, frequency, hematuria and urgency.  Musculoskeletal: Positive for myalgias. Negative for back pain and neck pain.  Skin: Negative for rash.  Neurological: Positive for weakness. Negative for dizziness, tingling, tremors, sensory change, speech change, focal weakness and headaches.  Endo/Heme/Allergies: Does not bruise/bleed easily.  Psychiatric/Behavioral: Negative for depression.    MEDICATIONS AT HOME:   Prior to Admission medications   Medication Sig Start Date End Date Taking? Authorizing Provider  amLODipine (NORVASC) 5 MG tablet Take 5 mg by mouth daily.   Yes [provider]  apixaban (ELIQUIS) 5 MG TABS tablet Take 1 tablet (5 mg total) by mouth 2 (two) times daily. 03/17/18  Yes Karen Kitchens, NP  aspirin EC 81 MG tablet Take 1 tablet (81 mg total) by mouth daily. 09/16/15  Yes [name], MD  docusate sodium (COLACE) 100 MG capsule Take 100 mg by mouth 2 (two) times daily.    Yes [provider]  ENSURE (ENSURE) Take 1 Can by mouth 3 (three) times daily with meals.   Yes [provider]  folic acid (FOLVITE) 867 MCG tablet Take 400 mcg by mouth daily.   Yes [provider]  furosemide (LASIX) 20 MG tablet Take 1 tablet (20 mg total) by mouth daily. 03/10/18  Yes Hackney, Tina A, FNP  ipratropium-albuterol (DUONEB) 0.5-2.5 (3) MG/3ML SOLN Take 3 mLs by nebulization every 4 (four) hours as needed.   Yes [provider]  JAKAFI 15 MG tablet TAKE 1 TABLET (15 MG TOTAL) BY MOUTH 2 TIMES DAILY. 05/07/18  Yes Karen Kitchens, NP  potassium chloride (K-DUR,KLOR-CON) 10 MEQ tablet Take 10 mEq by mouth as needed. TAKE WITH LASIX PRN   Yes [provider]  ciprofloxacin (CIPRO) 500 MG tablet Take 1 tablet (500 mg total) by mouth 2 (two) times daily. Patient not taking: Reported on 06/15/2018 05/18/18  Triplett, Cari B, FNP  hydroxyurea (HYDREA) 500 MG capsule Take 2 capsules (1,000 mg total) by mouth daily. Patient not taking: Reported on 06/15/2018 03/03/18   Karen Kitchens, NP  polyethylene glycol powder (GLYCOLAX/MIRALAX) powder 1 cap full in a full glass of water, once daily for 7 days. Patient not taking: Reported on 06/15/2018 04/13/18   Carrie Mew, MD  predniSONE (DELTASONE) 10 MG tablet Take 5 tablets (50 mg total) by mouth daily. Patient not taking: Reported on 06/15/2018 05/18/18   Sherrie George B, FNP      VITAL SIGNS:  Blood pressure 135/77, pulse 76, temperature 97.6 F (36.4 C), temperature source Oral, resp. rate (!) 24, height 5\' 7"  (1.702 m), weight 86.2 kg (190 lb), SpO2 94 %.  PHYSICAL EXAMINATION:   Physical Exam  GENERAL:  84 y.o.-year-old elderly patient lying in the bed with no acute distress.  EYES: Pupils equal, round, reactive to light and accommodation. No scleral icterus. Extraocular muscles intact.  HEENT: Head  atraumatic, normocephalic. Oropharynx and nasopharynx clear.  NECK:  Supple, no jugular venous distention. No thyroid enlargement, no tenderness.  LUNGS: Normal breath sounds bilaterally, no wheezing, rales,rhonchi or crepitation. No use of accessory muscles of respiration. Decreased bibasilar breath sounds CARDIOVASCULAR: S1, S2 normal. No  rubs, or gallops. 2/6 systolic murmur present. ABDOMEN: Soft, nontender, nondistended. Bowel sounds present. No organomegaly or mass.  EXTREMITIES: No pedal edema, cyanosis, or clubbing.  NEUROLOGIC: Cranial nerves II through XII are intact. Muscle strength 5/5 in both upper extremities and 3/5 both lower extremities. Sensation intact. Gait not checked. Global weakness noted. PSYCHIATRIC: The patient is alert and oriented x 3.  SKIN: No obvious rash, lesion, or ulcer.   LABORATORY PANEL:   CBC Recent Labs  Lab 06/15/18 1240  WBC 10.1  HGB 10.5*  HCT 35.2  PLT 661*   ------------------------------------------------------------------------------------------------------------------  Chemistries  Recent Labs  Lab 06/15/18 1240  NA 142  K 4.2  CL 106  CO2 27  GLUCOSE 121*  BUN 10  CREATININE 0.64  CALCIUM 9.0  AST 28  ALT 10  ALKPHOS 71  BILITOT 0.9   ------------------------------------------------------------------------------------------------------------------  Cardiac Enzymes Recent Labs  Lab 06/15/18 1240  TROPONINI 0.03*   ------------------------------------------------------------------------------------------------------------------  RADIOLOGY:  Ct Head Wo Contrast  Result Date: 06/15/2018 CLINICAL DATA:  Weakness EXAM: CT HEAD WITHOUT CONTRAST TECHNIQUE: Contiguous axial images were obtained from the base of the skull through the vertex without intravenous contrast. COMPARISON:  08/26/2016 FINDINGS: Brain: Diffuse atrophic changes are noted. No findings to suggest acute hemorrhage, acute infarction or space-occupying mass  lesion are seen. Vascular: No hyperdense vessel or unexpected calcification. Skull: Normal. Negative for fracture or focal lesion. Sinuses/Orbits: No acute finding. Other: None. IMPRESSION: Chronic atrophic changes.  No acute abnormality noted. Electronically Signed   By: Inez Catalina M.D.   On: 06/15/2018 13:21   Dg Chest Port 1 View  Result Date: 06/15/2018 CLINICAL DATA:  Increased weakness for the past 2-3 days. EXAM: PORTABLE CHEST 1 VIEW COMPARISON:  Chest x-ray dated May 17, 2018. FINDINGS: Stable cardiomegaly and enlarged pulmonary arteries. Atherosclerotic calcification of the aortic arch. Normal pulmonary vascularity. Unchanged elevation of the right hemidiaphragm with adjacent right basilar scarring. No focal consolidation, pleural effusion, or pneumothorax. No acute osseous abnormality. IMPRESSION: 1. No active disease. 2. Unchanged cardiomegaly and pulmonary arterial hypertension. Electronically Signed   By: Titus Dubin M.D.   On: 06/15/2018 13:04    EKG:   Orders placed or performed during the hospital  encounter of 06/15/18  . ED EKG  . ED EKG    IMPRESSION AND PLAN:   Ellysa Parrack  is a 84 y.o. female with a known history of polycythemia vera, collagen vascular disease, history of DVT and PE on Eliquis, status post colostomy, frequent admissions for UTI was brought into hospital secondary to worsening weakness.  1.  Acute cystitis-sent for urine cultures -Started on meropenem due to prior history of resistant organisms, including Pseudomonas -Due to history of CHF, hold off on IV fluids and encourage oral intake. -Physical therapy consult requested for weakness  2.  Hypertension-on Norvasc  3.  History of DVT and PE-patient on Eliquis  4.  Polycythemia vera-follows with cancer center and prior history of phlebotomies -Off hydroxyurea.  Currently on Jakafi  5.  DVT prophylaxis-already on Eliquis.  Physical therapy consulted    All the records are reviewed and  case discussed with ED provider. Management plans discussed with the patient, family and they are in agreement.  CODE STATUS: Full Code  TOTAL TIME TAKING CARE OF THIS PATIENT: 50 minutes.    Chinonso Linker M.D on 06/15/2018 at 3:36 PM  Between 7am to 6pm - Pager - [phone]  After 6pm go to [email or web address] - password [email or web address] [facility]  Office  [phone]  CC: Primary care physician; [name], MD

## 2018-06-15 NOTE — ED Provider Notes (Signed)
[facility] Emergency Department Provider Note  ____________________________________________   First MD Initiated Contact with Patient 06/15/18 1222     (approximate)  I have reviewed the triage vital signs and the nursing notes.   HISTORY  Chief Complaint Weakness    HPI Yolanda Wells is a 84 y.o. female who comes to the emergency department via EMS for 2 to 3 days of generalized malaise.  The patient says her symptoms have been insidious onset slowly progressive and are now severe.  She is unable to get up and ambulate on her own.  She has a complicated past medical history including remote pulmonary embolism, congestive heart failure, and frequent urinary tract infections as well as metabolic derangements.  She denies fevers or chills.  She denies trauma.  She says that she just "cannot get up and walk around".  Her symptoms are worse with exertion improved somewhat with rest.    Past Medical History:  Diagnosis Date  . Acute deep vein thrombosis (DVT) of distal vein of left lower extremity ([place]) 10/03/2015  . Acute pulmonary embolism ([place]) 10/03/2015  . Anticoagulant long-term use   . Arthritis   . CHF (congestive heart failure) ([place])   . Chronic anticoagulation 10/03/2015  . Collagen vascular disease ([place])   . Colostomy in place [facility]) 10/04/2015  . Deep venous thrombosis (HCC)    right lower extremity  . Dependent edema   . Diverticulosis of intestine with bleeding 04/02/2015  . Glenohumeral arthritis 06/28/2012  . Hammertoe 09/02/2012  . History of bilateral hip replacements 09/02/2012  . History of hysterectomy   . Hypertension   . Hypokalemia   . Inability to ambulate due to hip 10/12/2015  . Mandibular dysfunction   . Onychomycosis 09/02/2012  . Osteoarthritis   . Polycythemia vera ([place])   . Presence of IVC filter 05/25/2015  . 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 Urinary incontinence in female 09/02/2016    Patient Active  Problem List   Diagnosis Date Noted  . Anemia 06/07/2018  . Secondary myelofibrosis ([place]) 02/17/2018  . Goals of care, counseling/discussion 12/23/2017  . Palliative care by specialist   . DNR (do not resuscitate) discussion   . Weakness generalized   . Chronic diastolic CHF (congestive heart failure) ([place]) 07/28/2017  . Influenza with respiratory manifestation 12/01/2016  . Respiratory distress 11/23/2016  . Urinary incontinence in female 09/02/2016  . Vision loss of right eye 08/26/2016  . Blindness of right eye 08/26/2016  . Inability to ambulate due to hip 10/12/2015  . Colostomy in place [facility]) 10/04/2015  . Long term current use of anticoagulant therapy 10/03/2015  . Leukocytosis 09/25/2015  . CAP (community acquired pneumonia) 09/24/2015  . COPD (chronic obstructive pulmonary disease) ([place]) 09/24/2015  . UTI (urinary tract infection) 07/16/2015  . Abdominal wall cellulitis 07/15/2015  . DVT (deep venous thrombosis) ([place]) 07/15/2015  . HTN (hypertension) 07/15/2015  . Polycythemia vera ([place]) 07/15/2015  . Arthritis 07/15/2015  . Pulmonary emboli ([place]) 05/25/2015  . Diverticulosis of colon with hemorrhage 04/02/2015  . Diverticulosis of intestine with bleeding 04/02/2015  . Fall 01/10/2015  . Osteoarthritis 01/10/2015  . Hammertoe 09/02/2012  . S/P transmetatarsal amputation of foot ([place]) 09/02/2012  . Onychomycosis 09/02/2012  . Other specified dermatoses 09/02/2012  . S/P hip replacement 08/23/2012  . Glenohumeral arthritis 06/28/2012    Past Surgical History:  Procedure Laterality Date  . ABDOMINAL HYSTERECTOMY    . BLEPHAROPLASTY Right 03/2017   right upper eyelid  .  CARDIAC CATHETERIZATION Right 04/03/2015   Procedure: CENTRAL LINE INSERTION;  Surgeon: Sherri Rad, MD;  Location: [facility] ORS;  Service: General;  Laterality: Right;  . COLECTOMY WITH COLOSTOMY CREATION/HARTMANN PROCEDURE N/A 04/03/2015   Procedure: COLECTOMY WITH COLOSTOMY CREATION/HARTMANN PROCEDURE;   Surgeon: Sherri Rad, MD;  Location: [facility] ORS;  Service: General;  Laterality: N/A;  . COLON SURGERY    . FOOT AMPUTATION     partial  . FOOT AMPUTATION Right   . JOINT REPLACEMENT     Left and Right Hip  . PERIPHERAL VASCULAR CATHETERIZATION N/A 04/02/2015   Procedure: Visceral Angiography;  Surgeon: Algernon Huxley, MD;  Location: [place] CV LAB;  Service: Cardiovascular;  Laterality: N/A;  . PERIPHERAL VASCULAR CATHETERIZATION N/A 04/02/2015   Procedure: Visceral Artery Intervention;  Surgeon: Algernon Huxley, MD;  Location: [place] CV LAB;  Service: Cardiovascular;  Laterality: N/A;  . PERIPHERAL VASCULAR CATHETERIZATION N/A 05/25/2015   Procedure: IVC Filter Insertion;  Surgeon: Katha Cabal, MD;  Location: [place] CV LAB;  Service: Cardiovascular;  Laterality: N/A;  . TOE AMPUTATION     right  . TOTAL HIP ARTHROPLASTY      Prior to Admission medications   Medication Sig Start Date End Date Taking? Authorizing Provider  amLODipine (NORVASC) 5 MG tablet Take 5 mg by mouth daily.   Yes [provider]  apixaban (ELIQUIS) 5 MG TABS tablet Take 1 tablet (5 mg total) by mouth 2 (two) times daily. 03/17/18  Yes Karen Kitchens, NP  aspirin EC 81 MG tablet Take 1 tablet (81 mg total) by mouth daily. 09/16/15  Yes [name], MD  docusate sodium (COLACE) 100 MG capsule Take 100 mg by mouth 2 (two) times daily.    Yes [provider]  ENSURE (ENSURE) Take 1 Can by mouth 3 (three) times daily with meals.   Yes [provider]  folic acid (FOLVITE) 161 MCG tablet Take 400 mcg by mouth daily.   Yes [provider]  furosemide (LASIX) 20 MG tablet Take 1 tablet (20 mg total) by mouth daily. 03/10/18  Yes Hackney, Tina A, FNP  ipratropium-albuterol (DUONEB) 0.5-2.5 (3) MG/3ML SOLN Take 3 mLs by nebulization every 4 (four) hours as needed.   Yes [provider]  JAKAFI 15 MG tablet TAKE 1 TABLET (15 MG TOTAL) BY MOUTH 2 TIMES DAILY. 05/07/18   Yes Karen Kitchens, NP  cephALEXin (KEFLEX) 500 MG capsule Take 1 capsule (500 mg total) by mouth every 12 (twelve) hours. 06/18/18   Mayo, Pete Pelt, MD  potassium chloride (K-DUR,KLOR-CON) 10 MEQ tablet Take 1 tablet (10 mEq total) by mouth daily. T 06/18/18   Mayo, Pete Pelt, MD    Allergies Sulfamethoxazole-trimethoprim  Family History  Problem Relation Age of Onset  . Brain cancer Mother   . Heart attack Father   . COPD Father   . Coronary artery disease Father   . Hypertension Unknown   . Arthritis-Osteo Unknown   . Diabetes Brother   . Arthritis Brother   . Osteosarcoma Son   . Bone cancer Son   . Arthritis Sister   . Arthritis Brother   . Hypertension Brother   . Coronary artery disease Brother     Social History Social History   Tobacco Use  . Smoking status: Never Smoker  . Smokeless tobacco: Never Used  Substance Use Topics  . Alcohol use: No  . Drug use: No    Review of Systems Constitutional: No fever/chills  Eyes: No visual changes. ENT: No sore throat. Cardiovascular: Denies chest pain. Respiratory: Denies shortness of breath. Gastrointestinal: No abdominal pain.  No nausea, no vomiting.  No diarrhea.  No constipation. Genitourinary: Negative for dysuria. Musculoskeletal: Negative for back pain. Skin: Negative for rash. Neurological: Negative for headaches, focal weakness or numbness.   ____________________________________________   PHYSICAL EXAM:  VITAL SIGNS: ED Triage Vitals  Enc Vitals Group     BP      Pulse      Resp      Temp      Temp src      SpO2      Weight      Height      Head Circumference      Peak Flow      Pain Score      Pain Loc      Pain Edu?      Excl. in [place]?     Constitutional: Alert and oriented x4 although tired and somewhat lethargic appearing Eyes: PERRL EOMI. Head: Atraumatic. Nose: No congestion/rhinnorhea. Mouth/Throat: No trismus Neck: No stridor.   Cardiovascular: Normal rate, regular rhythm.  Grossly normal heart sounds.  Good peripheral circulation. Respiratory: Normal respiratory effort.  No retractions. Lungs CTAB and moving good air Gastrointestinal: Soft nontender no peritonitis no costovertebral tenderness Musculoskeletal: No lower extremity edema   Neurologic:  Normal speech and language. No gross focal neurologic deficits are appreciated. Skin:  Skin is warm, dry and intact. No rash noted. Psychiatric: Appears tired are normal.    ____________________________________________   DIFFERENTIAL includes but not limited to  Stroke, metabolic arrangement, sepsis, urinary tract infection ____________________________________________   LABS (all labs ordered are listed, but only abnormal results are displayed)  Labs Reviewed  URINE CULTURE - Abnormal; Notable for the following components:      Result Value   Culture >=100,000 COLONIES/mL ESCHERICHIA COLI (*)    Organism ID, Bacteria ESCHERICHIA COLI (*)    All other components within normal limits  URINALYSIS, COMPLETE (UACMP) WITH MICROSCOPIC - Abnormal; Notable for the following components:   Color, Urine YELLOW (*)    APPearance CLOUDY (*)    Hgb urine dipstick SMALL (*)    Protein, ur 30 (*)    Nitrite POSITIVE (*)    Leukocytes, UA LARGE (*)    WBC, UA >50 (*)    All other components within normal limits  COMPREHENSIVE METABOLIC PANEL - Abnormal; Notable for the following components:   Glucose, Bld 121 (*)    Albumin 3.4 (*)    All other components within normal limits  TROPONIN I - Abnormal; Notable for the following components:   Troponin I 0.03 (*)    All other components within normal limits  CBC WITH DIFFERENTIAL/PLATELET - Abnormal; Notable for the following components:   Hemoglobin 10.5 (*)    MCHC 29.9 (*)    RDW 31.3 (*)    Platelets 661 (*)    nRBC 15 (*)    Neutro Abs 8.5 (*)    All other components within normal limits  BASIC METABOLIC PANEL - Abnormal; Notable for the following components:    Calcium 8.7 (*)    All other components within normal limits  CBC - Abnormal; Notable for the following components:   RBC 3.16 (*)    Hemoglobin 9.0 (*)    HCT 27.2 (*)    RDW 29.9 (*)    Platelets 541 (*)    All other components within normal limits  CBC - Abnormal; Notable for the following components:   RBC 3.61 (*)    Hemoglobin 9.1 (*)    HCT 28.5 (*)    MCV 79.1 (*)    MCH 25.3 (*)    RDW 41.4 (*)    Platelets 501 (*)    All other components within normal limits  BASIC METABOLIC PANEL - Abnormal; Notable for the following components:   Glucose, Bld 104 (*)    Calcium 8.6 (*)    All other components within normal limits  CBC - Abnormal; Notable for the following components:   RBC 3.70 (*)    Hemoglobin 9.2 (*)    HCT 29.9 (*)    MCH 24.8 (*)    MCHC 30.7 (*)    RDW 41.5 (*)    Platelets 494 (*)    All other components within normal limits  BASIC METABOLIC PANEL - Abnormal; Notable for the following components:   CO2 34 (*)    All other components within normal limits    Lab work reviewed by me consistent with urinary tract infection __________________________________________  EKG    ____________________________________________  RADIOLOGY  Chest x-ray reviewed by me with no acute disease CT reviewed by me with no acute disease ____________________________________________   PROCEDURES  Procedure(s) performed: no  Procedures  Critical Care performed: no  ____________________________________________   INITIAL IMPRESSION / ASSESSMENT AND PLAN / ED COURSE  Pertinent labs & imaging results that were available during my care of the patient were reviewed by me and considered in my medical decision making (see chart for details).   As part of my medical decision making, I reviewed the following data within the [place] History obtained from family if available, nursing notes, old chart and ekg, as well as notes from prior ED  visits.       ----------------------------------------- 1:42 PM on 06/15/2018 -----------------------------------------  The patient's urinalysis is concerning for urinary tract infection.  I performed a culture review and she has had multiple UTIs with E. coli which are sensitive to ceftriaxone in the past although one UTI was with Pseudomonas which was resistant so I will cover her with gentamicin in addition to ceftriaxone.  Interestingly her CBC is not yet resulted we will reach out to the lab but that is somewhat concerning. ____________________________________________  As the patient has had resistant UTIs and has increasing weakness and is unable to ambulate on her own and is acutely infected she requires inpatient admission for continued IV antibiotics, fluid resuscitation, etc.  I discussed the hospitalist who is graciously agreed to admit the patient to her service.  FINAL CLINICAL IMPRESSION(S) / ED DIAGNOSES  Final diagnoses:  Urinary tract infection with hematuria, site unspecified  Elevated troponin  Weakness  Shortness of breath      NEW MEDICATIONS STARTED DURING THIS VISIT:  Discharge Medication List as of 06/18/2018 11:19 AM    START taking these medications   Details  cephALEXin (KEFLEX) 500 MG capsule Take 1 capsule (500 mg total) by mouth every 12 (twelve) hours., Starting Fri 06/18/2018, Normal         Note:  This document was prepared using Dragon voice recognition software and may include unintentional dictation errors.     Darel Hong, MD 06/18/18 2225

## 2018-06-15 NOTE — Progress Notes (Signed)
Pharmacy Antibiotic Note  Yolanda Wells is a 84 y.o. female admitted on 06/15/2018 with UTI.  Pharmacy has been consulted for meropenem dosing. She has a frequent history of UTIs with cultures here at [facility] for both E coli and P aeruginosa with both species showing sensitivity to carbapenems.  Plan: Meropenem 1 gram every 8 hours  Height: 5\' 7"  (170.2 cm) Weight: 190 lb (86.2 kg) IBW/kg (Calculated) : 61.6  Temp (24hrs), Avg:97.6 F (36.4 C), Min:97.6 F (36.4 C), Max:97.6 F (36.4 C)  Recent Labs  Lab 06/15/18 1240  WBC 10.1  CREATININE 0.64    Estimated Creatinine Clearance: 59 mL/min (by C-G formula based on SCr of 0.64 mg/dL).    Allergies  Allergen Reactions  . Sulfamethoxazole-Trimethoprim Nausea And Vomiting    Pt states she is not allergic to this medication. Pending removal of entry    Antimicrobials this admission: Ceftriaxone 8/6 x1 Gentamycin 8/6 x1 Meropenem 8/6 >>   Microbiology results: 8/6 UCx: pending  Thank you for allowing pharmacy to be a part of this patient's care.  Dallie Piles, PharmD 06/15/2018 3:30 PM

## 2018-06-16 LAB — CBC
HEMATOCRIT: 27.2 % — AB (ref 35.0–47.0)
HEMOGLOBIN: 9 g/dL — AB (ref 12.0–16.0)
MCH: 28.6 pg (ref 26.0–34.0)
MCHC: 33.2 g/dL (ref 32.0–36.0)
MCV: 86.3 fL (ref 80.0–100.0)
Platelets: 541 10*3/uL — ABNORMAL HIGH (ref 150–440)
RBC: 3.16 MIL/uL — AB (ref 3.80–5.20)
RDW: 29.9 % — AB (ref 11.5–14.5)
WBC: 8.8 10*3/uL (ref 3.6–11.0)

## 2018-06-16 LAB — BASIC METABOLIC PANEL
Anion gap: 7 (ref 5–15)
BUN: 14 mg/dL (ref 8–23)
CALCIUM: 8.7 mg/dL — AB (ref 8.9–10.3)
CO2: 31 mmol/L (ref 22–32)
Chloride: 104 mmol/L (ref 98–111)
Creatinine, Ser: 0.66 mg/dL (ref 0.44–1.00)
GFR calc Af Amer: >60 mL/min (ref >60)
GLUCOSE: 99 mg/dL (ref 70–99)
Potassium: 3.9 mmol/L (ref 3.5–5.1)
SODIUM: 142 mmol/L (ref 135–145)

## 2018-06-16 MED ORDER — ZINC OXIDE 40 % EX OINT
TOPICAL_OINTMENT | Freq: Two times a day (BID) | CUTANEOUS | Status: DC
  Administered 2018-06-16 – 2018-06-18 (×5): via TOPICAL
  Filled 2018-06-16: qty 113

## 2018-06-16 NOTE — Consult Note (Signed)
[place] Nurse wound consult note Evaluation of patient's buttocks completed in [facility].  No family present.  Patient turned self for evaluation. The patient's buttocks have macerated skin and small areas of skin rolling due to urinary incontinence.  An external urinary collection system is in use.  The patient also has on an incontinent brief.   Plan of care:  40% Desitin to affected skin twice daily.  An air mattress.  Use of one Dermatherapy pad beneath the buttocks. No use of incontinent briefs, turning every 2 hours. Floating the heels. I have also entered orders for ostomy supplies and ostomy care.  The patient has a fecal ostomy in the LUQ that is producing pudding consistency brown stool  She is wearing a two piece pouching system. Monitor the area(s) for worsening of condition such as: Signs/symptoms of infection,  Increase in size,  Development of or worsening of odor, Development of pain, or increased pain at the affected locations.  Notify the medical team if any of these develop.  Thank you for the consult.  Discussed plan of care with the patient and bedside nurse.  Central nurse will not follow at this time.  Please re-consult the [place] team if needed.  Val Riles, RN, MSN, CWOCN, CNS-BC, pager [phone]

## 2018-06-16 NOTE — Clinical Social Work Note (Signed)
Clinical Social Work Assessment  Patient Details  Name: Yolanda Wells MRN: [id number] Date of Birth: [birth year]  Date of referral:  06/16/18               Reason for consult:  Discharge Planning                Permission sought to share information with:    Permission granted to share information::     Name::        Agency::     Relationship::     Contact Information:     Housing/Transportation Living arrangements for the past 2 months:  Single Family Home Source of Information:  (granddaughter) Patient Interpreter Needed:  None, [place] Criminal Activity/Legal Involvement Pertinent to Current Situation/Hospitalization:  No - Comment as needed Significant Relationships:  Adult Children(granddaughter) Lives with:    Do you feel safe going back to the place where you live?  Yes Need for family participation in patient care:  Yes (Comment)  Care giving concerns:  Patient resides at home.    Social Worker assessment / plan:  CSW approached by patient's granddaughter: [name]: [phone]. She wanted to inform that PT had recommended short term rehab and that she and patient were in agreement with short term rehab. She stated that she realizes that her grandmother may not be here for 3 medical necessary nights and if she doesn't, then she will take patient home with resumption of home health. Bed search initiated.   Employment status:  Retired Forensic scientist:  Medicare PT Recommendations:  [place] / Referral to community resources:     Patient/Family's Response to care:  Patient's granddaughter expressed appreciation for CSW assistance.  Patient/Family's Understanding of and Emotional Response to Diagnosis, Current Treatment, and Prognosis:  Patient's granddaughter understands that patient may not be eligible for placement under medicare and is willing to care for patient at home if necessary.  Emotional Assessment Appearance:     Attitude/Demeanor/Rapport:    Affect (typically observed):    Orientation:    Alcohol / Substance use:  Not Applicable Psych involvement (Current and /or in the community):  No (Comment)  Discharge Needs  Concerns to be addressed:    Readmission within the last 30 days:  No Current discharge risk:  None Barriers to Discharge:  No Barriers Identified   Shela Leff, LCSW 06/16/2018, 4:53 PM

## 2018-06-16 NOTE — Progress Notes (Signed)
[facility] [place] [facility] [place] NAME: Yolanda Wells    MR#:  [id number]  DATE OF BIRTH:  [birth year]  SUBJECTIVE:  Doing well this morning. Has no concerns. Denies any fevers, chills, or abdominal pain. Her dysuria has completely resolved. States she is eating and drinking like normal.  REVIEW OF SYSTEMS:  Review of Systems  Constitutional: Negative for chills and fever.  HENT: Negative for congestion and sore throat.   Eyes: Negative for blurred vision and double vision.  Respiratory: Negative for cough and shortness of breath.   Cardiovascular: Negative for chest pain and palpitations.  Gastrointestinal: Negative for abdominal pain, diarrhea, nausea and vomiting.  Genitourinary: Negative for dysuria and frequency.  Musculoskeletal: Negative for back pain and neck pain.  Neurological: Negative for dizziness and headaches.  Psychiatric/Behavioral: Negative for depression. The patient is not nervous/anxious.     DRUG ALLERGIES:   Allergies  Allergen Reactions  . Sulfamethoxazole-Trimethoprim Nausea And Vomiting    Pt states she is not allergic to this medication. Pending removal of entry   VITALS:  Blood pressure (!) 155/76, pulse 75, temperature 98.1 F (36.7 C), temperature source Oral, resp. rate 18, height 5\' 7"  (1.702 m), weight 86.2 kg (190 lb), SpO2 94 %. PHYSICAL EXAMINATION:  Physical Exam  GENERAL:  84 y.o.-year-old elderly patient lying in the bed with no acute distress, pleasant. EYES: Pupils equal, round, reactive to light and accommodation. No scleral icterus. Extraocular muscles intact.  HEENT: Head atraumatic, normocephalic. Oropharynx and nasopharynx clear.  NECK:  Supple, no jugular venous distention. No thyroid enlargement, no tenderness.  LUNGS: Normal breath sounds bilaterally, no wheezing, rales,rhonchi or crepitation. No use of accessory muscles of respiration. Decreased bibasilar breath sounds CARDIOVASCULAR: S1, S2  normal. No  rubs, or gallops. 2/6 systolic murmur present. ABDOMEN: Soft, nontender, nondistended. Bowel sounds present. No organomegaly or mass. No rebound or guarding. EXTREMITIES: No pedal edema, cyanosis, or clubbing. +transmetatarsal amputation on the right NEUROLOGIC: Cranial nerves II through XII are intact. Global weakness present PSYCHIATRIC: The patient is alert and oriented x 3.  SKIN: No obvious rash, lesion, or ulcer.   LABORATORY PANEL:  Female CBC Recent Labs  Lab 06/16/18 0535  WBC 8.8  HGB 9.0*  HCT 27.2*  PLT 541*   ------------------------------------------------------------------------------------------------------------------ Chemistries  Recent Labs  Lab 06/15/18 1240 06/16/18 0347  NA 142 142  K 4.2 3.9  CL 106 104  CO2 27 31  GLUCOSE 121* 99  BUN 10 14  CREATININE 0.64 0.66  CALCIUM 9.0 8.7*  AST 28  --   ALT 10  --   ALKPHOS 71  --   BILITOT 0.9  --    RADIOLOGY:  No results found. ASSESSMENT AND PLAN:   Nilza Wells  is a 84 y.o. female with a known history of polycythemia vera, collagen vascular disease, history of DVT and PE on Eliquis, status post colostomy, frequent admissions for UTI was brought into hospital secondary to worsening weakness.  1.  Acute cystitis- no signs of pyelonephritis or sepsis - urine culture pending - continue meropenem for now, given patient's history of resistant organisms - PT consult- recommend SNF  2.  Hypertension- BPs well-controlled - continue norvasc  3.  History of DVT and PE - continue eliquis  4.  Normocytic anemia with a history of polycythemia vera- follows with cancer center and prior history of phlebotomies - continue home jakafi - daily CBCs  5.  DVT prophylaxis- continue home eliquis  All the records are reviewed and case discussed with Care Management/Social Worker. Management plans discussed with the patient, family and they are in agreement.  CODE STATUS: Full Code  TOTAL  TIME TAKING CARE OF THIS PATIENT: 35 minutes.   More than 50% of the time was spent in counseling/coordination of care: YES  POSSIBLE D/C IN 1-2 DAYS- awaiting blood culture results   Berna Spare Mayo M.D on 06/16/2018 at 2:31 PM  Between 7am to 6pm - Pager - [phone]  After 6pm go to [email or web address] - Proofreader  [facility]  Office  [phone]  CC: Primary care physician; [name], MD  Note: This dictation was prepared with Dragon dictation along with smaller phrase technology. Any transcriptional errors that result from this process are unintentional.

## 2018-06-16 NOTE — Care Management (Signed)
Patient admitted with acute cystitis.  Patient lives at home with granddaughter, who is at bedside .  PCP [place].  Patient is open with [facility] home health.  [name] with [facility] notified of admission.  Patient uses [facility] for transportation. Granddaughter states that patient has Hospital bed, RW, rollator, WC, and shower seat in the home.  Per granddaughter if patient has a qualifying 3 night stay they would like to pursue SNF.  If not they will return home with home health.  RNCM following

## 2018-06-16 NOTE — Evaluation (Signed)
Physical Therapy Evaluation Patient Details Name: Yolanda Wells MRN: [id number] DOB: 1932/02/12 Today's Date: 06/16/2018   History of Present Illness  Pt is an86 y.o.femalewith a known history ofpolycythemia vera, collagen vascular disease, history of DVT and PE on Eliquis, status post colostomy, frequent admissions for UTI was brought into hospital secondary to worsening weakness.  For 3 days prior to admission pt had been having chills, increased dysuria and also significant weakness. Labs were unremarkable except urine analysis indicating significant infection. She has had prior resistant organisms in urine including E. coli and also Pseudomonas. Assessment includes acute cystitis, HTN, h/o DVT, polycythemia vera, and DVT prophylaxis.    Clinical Impression  Pt presents with deficits in strength, transfers, mobility, gait, balance, and activity tolerance.  Pt required Mod A with bed mobility tasks and was unable to stand from the EOB at standard height.   With bed elevated pt was able to amb with +2 mod A and once in standing was unable to advance either LE.  Overall pt was motivated to participate during the session but was very weak functionally with a definite decline in function noted compared to her stated baseline.  Pt will benefit from PT services in a SNF setting upon discharge to safely address above deficits for decreased caregiver assistance and eventual return to PLOF.      Follow Up Recommendations SNF;Supervision for mobility/OOB    Equipment Recommendations  None recommended by PT    Recommendations for Other Services       Precautions / Restrictions Precautions Precautions: Fall Restrictions Weight Bearing Restrictions: No      Mobility  Bed Mobility Overal bed mobility: Needs Assistance Bed Mobility: Supine to Sit;Sit to Supine     Supine to sit: Mod assist Sit to supine: Mod assist   General bed mobility comments: Mod A for BLEs and upper body in and  out of bed  Transfers Overall transfer level: Needs assistance Equipment used: Rolling walker (2 wheeled) Transfers: Sit to/from Stand Sit to Stand: +2 safety/equipment;Mod assist;From elevated surface         General transfer comment: Mod verbal cues for sequencing  Ambulation/Gait             General Gait Details: Pt unable to advance either LE once in standing  Stairs            Wheelchair Mobility    Modified Rankin (Stroke Patients Only)       Balance Overall balance assessment: Needs assistance Sitting-balance support: Bilateral upper extremity supported;Feet supported Sitting balance-Leahy Scale: Good     Standing balance support: Bilateral upper extremity supported Standing balance-Leahy Scale: Fair                               Pertinent Vitals/Pain Pain Assessment: No/denies pain    Home Living Family/patient expects to be discharged to:: Private residence(History from pt assisted by granddaughter via phone call) Living Arrangements: Other relatives(granddaughter) Available Help at Discharge: Family;Personal care attendant;Available PRN/intermittently(Granddaughter is a Ship broker and pt has PCA daily from 9-12 and 5-7) Type of Home: [place]: One [place]: [place] - 2 wheels;Wheelchair - manual Additional Comments: Granddaughter is a Ship broker and pt has PCA daily from 9-12 and 5-7    Prior Function Level of Independence: Needs assistance   Gait / Transfers Assistance Needed: Pt SBA with transfers, min A with bed mobility, and  was ambulating 5-10 feet with a RW with HHPT prior to admission  ADL's / Homemaking Assistance Needed: Caregiver assists with a sponge bath, dressing, brushing her hair.  Granddaughter does the drving, cooking, cleaning.  Pt independent with feeding.          Hand Dominance   Dominant Hand: Right    Extremity/Trunk Assessment   Upper Extremity  Assessment Upper Extremity Assessment: Generalized weakness    Lower Extremity Assessment Lower Extremity Assessment: Generalized weakness       Communication   Communication: No difficulties  Cognition Arousal/Alertness: Awake/alert Behavior During Therapy: WFL for tasks assessed/performed Overall Cognitive Status: Within Functional Limits for tasks assessed                                        General Comments      Exercises Total Joint Exercises Ankle Circles/Pumps: AROM;Both;10 reps;15 reps Quad Sets: Strengthening;Both;10 reps;15 reps Gluteal Sets: Strengthening;Both;10 reps Heel Slides: AAROM;Both;5 reps Hip ABduction/ADduction: AAROM;Both;10 reps Straight Leg Raises: AAROM;Both;10 reps Long Arc Quad: AROM;Both;10 reps   Assessment/Plan    PT Assessment Patient needs continued PT services  PT Problem List Decreased strength;Decreased activity tolerance;Decreased balance;Decreased mobility       PT Treatment Interventions DME instruction;Gait training;Functional mobility training;Balance training;Therapeutic exercise;Therapeutic activities;Patient/family education    PT Goals (Current goals can be found in the Care Plan section)  Acute Rehab PT Goals Patient Stated Goal: To get stronger PT Goal Formulation: With patient Time For Goal Achievement: 06/29/18 Potential to Achieve Goals: Good    Frequency Min 2X/week   Barriers to discharge Inaccessible home environment;Decreased caregiver support      Co-evaluation               AM-PAC PT "6 Clicks" Daily Activity  Outcome Measure Difficulty turning over in bed (including adjusting bedclothes, sheets and blankets)?: Unable Difficulty moving from lying on back to sitting on the side of the bed? : Unable Difficulty sitting down on and standing up from a chair with arms (e.g., wheelchair, bedside commode, etc,.)?: Unable Help needed moving to and from a bed to chair (including a  wheelchair)?: A Lot Help needed walking in hospital room?: Total Help needed climbing 3-5 steps with a railing? : Total 6 Click Score: 7    End of Session Equipment Utilized During Treatment: Gait belt Activity Tolerance: Patient tolerated treatment well Patient left: in bed;with bed alarm set;with nursing/sitter in room;with call bell/phone within reach Nurse Communication: Mobility status PT Visit Diagnosis: Muscle weakness (generalized) (M62.81);Difficulty in walking, not elsewhere classified (R26.2)    Time: 0623-7628 PT Time Calculation (min) (ACUTE ONLY): 42 min   Charges:   PT Evaluation $PT Eval Low Complexity: 1 Low PT Treatments $Therapeutic Exercise: 8-22 mins        D. Scott Baylin Gamblin PT, DPT 06/16/18, 1:27 PM

## 2018-06-16 NOTE — NC FL2 (Signed)
[place] LEVEL OF CARE SCREENING TOOL     IDENTIFICATION  Patient Name: Yolanda Wells Birthdate: 12/06/1931 Sex: female Admission Date (Current Location): 06/15/2018  [place] and [place] Number:  Engineering geologist and Address:  [facility], [address] [place], [place] [address]      Provider Number: [id number]  Attending Physician Name and Address:  Sela Hua, MD  Relative Name and Phone Number:  [name]- Granddaughter [phone]    Current Level of Care: Hospital Recommended Level of Care: [place] Prior Approval Number:    Date Approved/Denied:   PASRR Number: [id number]  Discharge Plan: SNF    Current Diagnoses: Patient Active Problem List   Diagnosis Date Noted  . Anemia 06/07/2018  . Secondary myelofibrosis ([place]) 02/17/2018  . Goals of care, counseling/discussion 12/23/2017  . Palliative care by specialist   . DNR (do not resuscitate) discussion   . Weakness generalized   . Chronic diastolic CHF (congestive heart failure) ([place]) 07/28/2017  . Influenza with respiratory manifestation 12/01/2016  . Respiratory distress 11/23/2016  . Urinary incontinence in female 09/02/2016  . Vision loss of right eye 08/26/2016  . Blindness of right eye 08/26/2016  . Inability to ambulate due to hip 10/12/2015  . Colostomy in place [facility]) 10/04/2015  . Long term current use of anticoagulant therapy 10/03/2015  . Leukocytosis 09/25/2015  . CAP (community acquired pneumonia) 09/24/2015  . COPD (chronic obstructive pulmonary disease) ([place]) 09/24/2015  . UTI (urinary tract infection) 07/16/2015  . Abdominal wall cellulitis 07/15/2015  . DVT (deep venous thrombosis) ([place]) 07/15/2015  . HTN (hypertension) 07/15/2015  . Polycythemia vera ([place]) 07/15/2015  . Arthritis 07/15/2015  . Pulmonary emboli ([place]) 05/25/2015  . Diverticulosis of colon with hemorrhage 04/02/2015  . Diverticulosis of intestine with  bleeding 04/02/2015  . Fall 01/10/2015  . Osteoarthritis 01/10/2015  . Hammertoe 09/02/2012  . S/P transmetatarsal amputation of foot ([place]) 09/02/2012  . Onychomycosis 09/02/2012  . Other specified dermatoses 09/02/2012  . S/P hip replacement 08/23/2012  . Glenohumeral arthritis 06/28/2012    Orientation RESPIRATION BLADDER Height & Weight     Self, Time, Place  Normal Incontinent Weight: 190 lb (86.2 kg) Height:  5\' 7"  (170.2 cm)  BEHAVIORAL SYMPTOMS/MOOD NEUROLOGICAL BOWEL NUTRITION STATUS  (none) (None ) Colostomy Diet(Heart Healthy )  AMBULATORY STATUS COMMUNICATION OF NEEDS Skin   Extensive Assist Verbally Normal                       Personal Care Assistance Level of Assistance  Bathing, Feeding, Dressing Bathing Assistance: Limited assistance Feeding assistance: Independent Dressing Assistance: Limited assistance     Functional Limitations Info  Sight, Hearing, Speech Sight Info: Adequate Hearing Info: Adequate Speech Info: Adequate    SPECIAL CARE FACTORS FREQUENCY  PT (By licensed PT), OT (By licensed OT)     PT Frequency: 5 OT Frequency: 5            Contractures Contractures Info: Not present    Additional Factors Info  Code Status, Allergies Code Status Info: Full Code  Allergies Info:  Sulfamethoxazole-trimethoprim           Current Medications (06/16/2018):  This is the current hospital active medication list Current Facility-Administered Medications  Medication Dose Route Frequency Provider Last Rate Last Dose  . acetaminophen (TYLENOL) tablet 650 mg  650 mg Oral Q6H PRN [name], MD       Or  . acetaminophen (  TYLENOL) suppository 650 mg  650 mg Rectal Q6H PRN [name], MD      . amLODipine (NORVASC) tablet 5 mg  5 mg Oral Daily [name], MD   5 mg at 06/16/18 1036  . apixaban (ELIQUIS) tablet 5 mg  5 mg Oral BID [name], MD   5 mg at 06/16/18 1036  . aspirin EC tablet 81 mg  81 mg Oral Daily  [name], MD   81 mg at 06/16/18 1036  . docusate sodium (COLACE) capsule 100 mg  100 mg Oral BID [name], MD   100 mg at 06/16/18 1036  . feeding supplement (ENSURE ENLIVE) (ENSURE ENLIVE) liquid 237 mL  237 mL Oral TID WC [name], MD   237 mL at 06/16/18 1212  . folic acid (FOLVITE) tablet 0.5 mg  500 mcg Oral Daily [name], MD   0.5 mg at 06/16/18 1036  . furosemide (LASIX) tablet 20 mg  20 mg Oral Daily [name], MD   20 mg at 06/16/18 1036  . ipratropium-albuterol (DUONEB) 0.5-2.5 (3) MG/3ML nebulizer solution 3 mL  3 mL Nebulization Q4H PRN [name], MD      . liver oil-zinc oxide (DESITIN) 40 % ointment   Topical BID Mayo, Pete Pelt, MD      . meropenem [facility]) 1 g in sodium chloride 0.9 % 100 mL IVPB  1 g Intravenous Q8H [name], MD   Stopped at 06/16/18 0531  . ondansetron (ZOFRAN) tablet 4 mg  4 mg Oral Q6H PRN [name], MD       Or  . ondansetron (ZOFRAN) injection 4 mg  4 mg Intravenous Q6H PRN [name], MD      . ruxolitinib phosphate (JAKAFI) tablet 15 mg  15 mg Oral BID [name], MD   15 mg at 06/16/18 1211     Discharge Medications: Please see discharge summary for a list of discharge medications.  Relevant Imaging Results:  Relevant Lab Results:   Additional Information SSN [id number]  Annamaria Boots, [place]

## 2018-06-17 ENCOUNTER — Inpatient Hospital Stay: Payer: Medicare Other

## 2018-06-17 LAB — BASIC METABOLIC PANEL
Anion gap: 6 (ref 5–15)
BUN: 14 mg/dL (ref 8–23)
CALCIUM: 8.6 mg/dL — AB (ref 8.9–10.3)
CO2: 31 mmol/L (ref 22–32)
CREATININE: 0.77 mg/dL (ref 0.44–1.00)
Chloride: 104 mmol/L (ref 98–111)
GFR calc non Af Amer: >60 mL/min (ref >60)
Glucose, Bld: 104 mg/dL — ABNORMAL HIGH (ref 70–99)
Potassium: 4.1 mmol/L (ref 3.5–5.1)
SODIUM: 141 mmol/L (ref 135–145)

## 2018-06-17 LAB — URINE CULTURE

## 2018-06-17 LAB — CBC
HCT: 28.5 % — ABNORMAL LOW (ref 35.0–47.0)
Hemoglobin: 9.1 g/dL — ABNORMAL LOW (ref 12.0–16.0)
MCH: 25.3 pg — AB (ref 26.0–34.0)
MCHC: 32 g/dL (ref 32.0–36.0)
MCV: 79.1 fL — ABNORMAL LOW (ref 80.0–100.0)
Platelets: 501 10*3/uL — ABNORMAL HIGH (ref 150–440)
RBC: 3.61 MIL/uL — ABNORMAL LOW (ref 3.80–5.20)
RDW: 41.4 % — AB (ref 11.5–14.5)
WBC: 10.9 10*3/uL (ref 3.6–11.0)

## 2018-06-17 MED ORDER — CEPHALEXIN 500 MG PO CAPS
500.0000 mg | ORAL_CAPSULE | Freq: Two times a day (BID) | ORAL | Status: DC
  Administered 2018-06-17 – 2018-06-18 (×3): 500 mg via ORAL
  Filled 2018-06-17 (×3): qty 1

## 2018-06-17 NOTE — Clinical Social Work Note (Signed)
CSW attempted to contact patient's granddaughter, [name], in order to extend bed offers. CSW left message. Shela Leff MSW,LcSW [phone]

## 2018-06-17 NOTE — Progress Notes (Signed)
Physical Therapy Treatment Patient Details Name: Yolanda Wells MRN: [id number] DOB: Aug 24, 1932 Today's Date: 06/17/2018    History of Present Illness Pt is an86 y.o.femalewith a known history ofpolycythemia vera, collagen vascular disease, history of DVT and PE on Eliquis, status post colostomy, frequent admissions for UTI was brought into hospital secondary to worsening weakness.  For 3 days prior to admission pt had been having chills, increased dysuria and also significant weakness. Labs were unremarkable except urine analysis indicating significant infection. She has had prior resistant organisms in urine including E. coli and also Pseudomonas. Assessment includes acute cystitis, HTN, h/o DVT, polycythemia vera, and DVT prophylaxis.    PT Comments    Pt presents with deficits in strength, transfers, mobility, gait, balance, and activity tolerance.  Pt required Mod A with all bed mobility tasks along with extra time and effort.  Pt required +2 extensive assistance to stand from an elevated EOB.  Once in standing pt was able to take several very small shuffling steps with a RW and mod A.  Pt tolerated several sessions of standing but was only able to take steps during the first time standing but was unable to advance either LE during subsequent standing sessions.  Max standing time was around 20-30 sec.  Pt will benefit from PT services in a SNF setting upon discharge to safely address above deficits for decreased caregiver assistance and eventual return to PLOF.     Follow Up Recommendations  SNF;Supervision for mobility/OOB     Equipment Recommendations  None recommended by PT    Recommendations for Other Services       Precautions / Restrictions Precautions Precautions: Fall Restrictions Weight Bearing Restrictions: No    Mobility  Bed Mobility Overal bed mobility: Needs Assistance Bed Mobility: Supine to Sit;Sit to Supine     Supine to sit: Mod assist Sit to supine:  Mod assist   General bed mobility comments: Mod A for BLEs and upper body in and out of bed  Transfers Overall transfer level: Needs assistance Equipment used: Rolling walker (2 wheeled) Transfers: Sit to/from Stand Sit to Stand: Mod assist;From elevated surface;+2 physical assistance;+2 safety/equipment; pt able to perform 3 sit to/from stand transfers this session.          General transfer comment: Mod verbal cues for sequencing  Ambulation/Gait Ambulation/Gait assistance: Mod assist Gait Distance (Feet): 1 Feet Assistive device: Rolling walker (2 wheeled) Gait Pattern/deviations: Step-to pattern;Trunk flexed;Shuffle     General Gait Details: Pt able to take 2-3 very small shuffling steps with +2 mod A at EOB   Stairs             Wheelchair Mobility    Modified Rankin (Stroke Patients Only)       Balance Overall balance assessment: Needs assistance Sitting-balance support: Bilateral upper extremity supported;Feet supported Sitting balance-Leahy Scale: Good     Standing balance support: Bilateral upper extremity supported Standing balance-Leahy Scale: Fair                              Cognition Arousal/Alertness: Awake/alert Behavior During Therapy: WFL for tasks assessed/performed Overall Cognitive Status: Within Functional Limits for tasks assessed                                        Exercises Total Joint Exercises Ankle Circles/Pumps: Strengthening;Both;10 reps;15 reps Quad  Sets: Strengthening;Both;10 reps;15 reps Heel Slides: AAROM;Both;5 reps Hip ABduction/ADduction: AAROM;Both;10 reps;15 reps Straight Leg Raises: AAROM;Both;10 reps;15 reps Long Arc Quad: AROM;Both;10 reps;15 reps Knee Flexion: AROM;Both;10 reps;15 reps Marching in Standing: AROM;Both;10 reps;Seated    General Comments        Pertinent Vitals/Pain Pain Assessment: No/denies pain    Home Living                      Prior  Function            PT Goals (current goals can now be found in the care plan section) Progress towards PT goals: Progressing toward goals    Frequency    Min 2X/week      PT Plan Current plan remains appropriate    Co-evaluation              AM-PAC PT "6 Clicks" Daily Activity  Outcome Measure                   End of Session Equipment Utilized During Treatment: Gait belt Activity Tolerance: Patient tolerated treatment well Patient left: in bed;with bed alarm set;with call bell/phone within reach;with family/visitor present Nurse Communication: Mobility status PT Visit Diagnosis: Muscle weakness (generalized) (M62.81);Difficulty in walking, not elsewhere classified (R26.2)     Time: 5747-3403 PT Time Calculation (min) (ACUTE ONLY): 38 min  Charges:  $Therapeutic Exercise: 23-37 mins $Therapeutic Activity: 8-22 mins                     D. Scott Kristy [place]urg PT, DPT 06/17/18, 2:29 PM

## 2018-06-17 NOTE — Progress Notes (Addendum)
Yolanda Wells at [place] NAME: Yolanda Wells    MR#:  [id number]  DATE OF BIRTH:  12-Apr-1932  SUBJECTIVE:  No complaints this morning. No urinary symptoms. No abdominal pain.  REVIEW OF SYSTEMS:  Review of Systems  Constitutional: Negative for chills and fever.  HENT: Negative for congestion and sore throat.   Eyes: Negative for blurred vision and double vision.  Respiratory: Negative for cough and shortness of breath.   Cardiovascular: Negative for chest pain and palpitations.  Gastrointestinal: Negative for abdominal pain, diarrhea, nausea and vomiting.  Genitourinary: Negative for dysuria and frequency.  Musculoskeletal: Negative for back pain and neck pain.  Neurological: Negative for dizziness and headaches.  Psychiatric/Behavioral: Negative for depression. The patient is not nervous/anxious.     DRUG ALLERGIES:   Allergies  Allergen Reactions  . Sulfamethoxazole-Trimethoprim Nausea And Vomiting    Pt states she is not allergic to this medication. Pending removal of entry   VITALS:  Blood pressure (!) 113/52, pulse 83, temperature 99.3 F (37.4 C), resp. rate (!) 24, height 5\' 7"  (1.702 m), weight 86.2 kg, SpO2 97 %. PHYSICAL EXAMINATION:  Physical Exam  GENERAL:  84 y.o.-year-old elderly patient lying in the bed with no acute distress, pleasant. EYES: Pupils equal, round, reactive to light and accommodation. No scleral icterus. Extraocular muscles intact.  HEENT: Head atraumatic, normocephalic. Oropharynx and nasopharynx clear.  NECK:  Supple, no jugular venous distention. No thyroid enlargement, no tenderness.  LUNGS: Normal breath sounds bilaterally, +faint crackles in the LLB CARDIOVASCULAR: S1, S2 normal. No  rubs, or gallops. 2/6 systolic murmur present. ABDOMEN: Soft, nontender, nondistended. Bowel sounds present. No organomegaly or mass. No rebound or guarding. EXTREMITIES: No pedal edema, cyanosis, or clubbing.  +transmetatarsal amputation on the right NEUROLOGIC: Cranial nerves II through XII are intact. Global weakness present PSYCHIATRIC: The patient is alert and oriented x 3.  SKIN: No obvious rash, lesion, or ulcer.   LABORATORY PANEL:  Female CBC Recent Labs  Lab 06/17/18 0411  WBC 10.9  HGB 9.1*  HCT 28.5*  PLT 501*   ------------------------------------------------------------------------------------------------------------------ Chemistries  Recent Labs  Lab 06/15/18 1240  06/17/18 0411  NA 142   < > 141  K 4.2   < > 4.1  CL 106   < > 104  CO2 27   < > 31  GLUCOSE 121*   < > 104*  BUN 10   < > 14  CREATININE 0.64   < > 0.77  CALCIUM 9.0   < > 8.6*  AST 28  --   --   ALT 10  --   --   ALKPHOS 71  --   --   BILITOT 0.9  --   --    < > = values in this interval not displayed.   RADIOLOGY:  Dg Chest 2 View  Result Date: 06/17/2018 CLINICAL DATA:  Shortness of breath. EXAM: CHEST - 2 VIEW COMPARISON:  Radiograph of June 15, 2018. FINDINGS: Stable cardiomegaly. Atherosclerosis of thoracic aorta is noted. No pneumothorax or pleural effusion is noted. Anterior osteophyte formation is noted throughout the thoracic spine. No acute pulmonary disease is noted. Degenerative changes seen involving both glenohumeral joints. IMPRESSION: No active cardiopulmonary disease. Aortic Atherosclerosis (ICD10-I70.0). Electronically Signed   By: Marijo Conception, M.D.   On: 06/17/2018 08:45   ASSESSMENT AND PLAN:   Yolanda Wells  is a 84 y.o. female with a known history of polycythemia vera, collagen  vascular disease, history of DVT and PE on Eliquis, status post colostomy, frequent admissions for UTI was brought into hospital secondary to worsening weakness.  1.  E. Coli UTI- no signs of pyelonephritis or sepsis - on meropenem due to history of resistant organisms, will change to keflex today - PT consult- recommend SNF  2.  Hypertension- BPs well-controlled - continue norvasc  3.   History of DVT and PE - continue eliquis  4.  Normocytic anemia with a history of polycythemia vera- follows with cancer center and prior history of phlebotomies - continue home jakafi - daily CBCs  5.  Chronic diastolic heart failure- faint crackles in the left lung base, but does not appear volume overloaded. - check CXR - continue to monitor  All the records are reviewed and case discussed with Care Management/Social Worker. Management plans discussed with the patient, family and they are in agreement.  CODE STATUS: Full Code  TOTAL TIME TAKING CARE OF THIS PATIENT: 30 minutes.   More than 50% of the time was spent in counseling/coordination of care: YES  POSSIBLE D/C tomorrow- awaiting SNF placement   Evette Doffing M.D on 06/17/2018 at 4:30 PM  Between 7am to 6pm - Pager - [phone]  After 6pm go to [email or web address] - Proofreader  [employer] [place] [facility]  Office  [phone]  CC: Primary care physician; [name], MD  Note: This dictation was prepared with Dragon dictation along with smaller phrase technology. Any transcriptional errors that result from this process are unintentional.

## 2018-06-18 LAB — BASIC METABOLIC PANEL
Anion gap: 5 (ref 5–15)
BUN: 15 mg/dL (ref 8–23)
CALCIUM: 8.9 mg/dL (ref 8.9–10.3)
CO2: 34 mmol/L — AB (ref 22–32)
CREATININE: 0.6 mg/dL (ref 0.44–1.00)
Chloride: 105 mmol/L (ref 98–111)
GFR calc non Af Amer: >60 mL/min (ref >60)
GLUCOSE: 96 mg/dL (ref 70–99)
Potassium: 4.2 mmol/L (ref 3.5–5.1)
Sodium: 144 mmol/L (ref 135–145)

## 2018-06-18 LAB — CBC
HEMATOCRIT: 29.9 % — AB (ref 35.0–47.0)
Hemoglobin: 9.2 g/dL — ABNORMAL LOW (ref 12.0–16.0)
MCH: 24.8 pg — AB (ref 26.0–34.0)
MCHC: 30.7 g/dL — AB (ref 32.0–36.0)
MCV: 80.9 fL (ref 80.0–100.0)
Platelets: 494 10*3/uL — ABNORMAL HIGH (ref 150–440)
RBC: 3.7 MIL/uL — ABNORMAL LOW (ref 3.80–5.20)
RDW: 41.5 % — AB (ref 11.5–14.5)
WBC: 7.5 10*3/uL (ref 3.6–11.0)

## 2018-06-18 MED ORDER — POTASSIUM CHLORIDE CRYS ER 10 MEQ PO TBCR: 10.0000 meq | EXTENDED_RELEASE_TABLET | Freq: Every day | ORAL | 0 refills | Status: DC

## 2018-06-18 MED ORDER — CEPHALEXIN 500 MG PO CAPS: 500.0000 mg | ORAL_CAPSULE | Freq: Two times a day (BID) | ORAL | 0 refills | Status: DC

## 2018-06-18 NOTE — Progress Notes (Signed)
Nsg Discharge Note  Admit Date:  06/15/2018 Discharge date: 06/18/2018   Rhae Lerner to be D/C'd Skilled nursing facility per MD order.  AVS completed.  Copy for chart, and copy for patient signed, and dated. Patient/caregiver able to verbalize understanding.  Discharge Medication: Allergies as of 06/18/2018      Reactions   Sulfamethoxazole-trimethoprim Nausea And Vomiting   Pt states she is not allergic to this medication. Pending removal of entry      Medication List    STOP taking these medications   ciprofloxacin 500 MG tablet Commonly known as:  CIPRO   hydroxyurea 500 MG capsule Commonly known as:  HYDREA   polyethylene glycol powder powder Commonly known as:  GLYCOLAX/MIRALAX   predniSONE 10 MG tablet Commonly known as:  DELTASONE     TAKE these medications   amLODipine 5 MG tablet Commonly known as:  NORVASC Take 5 mg by mouth daily.   apixaban 5 MG Tabs tablet Commonly known as:  ELIQUIS Take 1 tablet (5 mg total) by mouth 2 (two) times daily.   aspirin EC 81 MG tablet Take 1 tablet (81 mg total) by mouth daily.   cephALEXin 500 MG capsule Commonly known as:  KEFLEX Take 1 capsule (500 mg total) by mouth every 12 (twelve) hours.   docusate sodium 100 MG capsule Commonly known as:  COLACE Take 100 mg by mouth 2 (two) times daily.   ENSURE Take 1 Can by mouth 3 (three) times daily with meals.   folic acid 235 MCG tablet Commonly known as:  FOLVITE Take 400 mcg by mouth daily.   furosemide 20 MG tablet Commonly known as:  LASIX Take 1 tablet (20 mg total) by mouth daily.   ipratropium-albuterol 0.5-2.5 (3) MG/3ML Soln Commonly known as:  DUONEB Take 3 mLs by nebulization every 4 (four) hours as needed.   JAKAFI 15 MG tablet Generic drug:  ruxolitinib phosphate TAKE 1 TABLET (15 MG TOTAL) BY MOUTH 2 TIMES DAILY.   potassium chloride 10 MEQ tablet Commonly known as:  K-DUR,KLOR-CON Take 1 tablet (10 mEq total) by mouth daily. T What changed:     when to take this  reasons to take this  additional instructions       Discharge Assessment: Vitals:   06/18/18 1217 06/18/18 1652  BP: 119/62 (!) 117/95  Pulse: 71 74  Resp: 20 20  Temp: 97.7 F (36.5 C) 98.5 F (36.9 C)  SpO2: 96% 98%   Skin clean, dry and intact without evidence of skin break down, no evidence of skin tears noted. IV catheter discontinued intact. Site without signs and symptoms of complications - no redness or edema noted at insertion site, patient denies c/o pain - only slight tenderness at site.  Dressing with slight pressure applied.  D/c Instructions-Education: Discharge instructions given to EMT. D/c education completed with patient/family including follow up instructions, medication list, d/c activities limitations if indicated, with other d/c instructions as indicated by MD - patient able to verbalize understanding, all questions fully answered. Patient instructed to return to ED, call 911, or call MD for any changes in condition.  Patient transported via EMS to SNF. Paperwork with EMT.   Awaiting pt's granddaughter to arrive to get pt's medication from pharmacy.  Eda Keys, RN 06/18/2018 5:00 PM

## 2018-06-18 NOTE — Care Management Important Message (Signed)
Copy of signed IM left with patient in room.  

## 2018-06-18 NOTE — Progress Notes (Signed)
Pt's granddaughter, [name], arrived to floor to pick up pt's medications. Walked with pt to [employer]. Picked up medication and receipt for medications signed by RN and [name]. No further needs voiced.

## 2018-06-18 NOTE — Progress Notes (Signed)
Called to pt's room. Pt c/o being "stuck in a hole". Pt educated that bed was set up to turn her automatically and she is in the lowest part of the turn. Pt requested to have to "turn taken off". Pt mattress inflated to max per her request.

## 2018-06-18 NOTE — Progress Notes (Signed)
Attempted to call [facility] to call report. Placed on hold for 10 min. When RN attempted to call back, no answer.

## 2018-06-18 NOTE — Progress Notes (Signed)
Called report to [facility] LPN @ [facility]. Questions asked and answered. Informed nurse of pt's home med to be brought by pt's granddaughter. EMS called for transport. Pt is second on list for EMS pickup. Pt is ready to go per Nurse Tech.

## 2018-06-18 NOTE — Clinical Social Work Note (Signed)
Patient is on Luke which is a $14,000 medication. CSW has asked the facilities that offered if patient could bring her home med and use it at the facility. [facility] checked with their Director of Nursing and they will allow patient to bring the [facility]. CSW has updated patient's granddaughter, [name], and she is in agreement. She will come later today to pick up the patient's medication from the pharmacy here at [facility]. Patient's nurse, Kathlee Nations, called [facility] pharmacy and spoke with Anya who stated that the granddaughter can come to pick the medication up after patient has left but will need to bring a form of identification and the nurse will need to walk her to the pharmacy to sign it.  Shela Leff MSW,LCSW [phone]

## 2018-06-18 NOTE — Care Management (Signed)
Patient to discharge to [facility] today.  Cheryl with [facility] notified of disposition. RNCM signing off

## 2018-06-18 NOTE — Discharge Summary (Addendum)
[place] [facility] [place] NAME: Yolanda Wells    MR#:  [id number]  DATE OF BIRTH:  [birth year]  DATE OF ADMISSION:  06/15/2018   ADMITTING PHYSICIAN: [name], MD  DATE OF DISCHARGE: 06/18/18  PRIMARY CARE PHYSICIAN: [name], MD   ADMISSION DIAGNOSIS:  Weakness [R53.1] Elevated troponin [R74.8] Urinary tract infection with hematuria, site unspecified [N39.0, R31.9] DISCHARGE DIAGNOSIS:  Active Problems:   UTI (urinary tract infection)  SECONDARY DIAGNOSIS:   Past Medical History:  Diagnosis Date  . Acute deep vein thrombosis (DVT) of distal vein of left lower extremity ([place]) 10/03/2015  . Acute pulmonary embolism ([place]) 10/03/2015  . Anticoagulant long-term use   . Arthritis   . CHF (congestive heart failure) ([place])   . Chronic anticoagulation 10/03/2015  . Collagen vascular disease ([place])   . Colostomy in place [facility]) 10/04/2015  . Deep venous thrombosis ([facility])    right lower extremity  . Dependent edema   . Diverticulosis of intestine with bleeding 04/02/2015  . Glenohumeral arthritis 06/28/2012  . Hammertoe 09/02/2012  . History of bilateral hip replacements 09/02/2012  . History of hysterectomy   . Hypertension   . Hypokalemia   . Inability to ambulate due to hip 10/12/2015  . Mandibular dysfunction   . Onychomycosis 09/02/2012  . Osteoarthritis   . Polycythemia vera ([place])   . Presence of IVC filter 05/25/2015  . 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 Urinary incontinence in female 09/02/2016   HOSPITAL COURSE:   Zahra is an 84 year old female with a PMH of DVT/PE, CHF, polycythemia vera, diverticulosis s/p colostomy, and recurrent UTIs who presented to the ED with generalized weakness. Work-up was positive for UTI. She did not show any signs of sepsis or hemodynamic instability while she was hospitalized. She was initially treated with meropenem due a history of resistant organisms. Urine cultures grew E.  Coli sensitive to cephalosporins so she was transitioned to Keflex for a total 7 day course. She was evaluated by PT, who recommended SNF placement.  DISCHARGE CONDITIONS:  E. Coli UTI Hypertension History of DVT/PE Polycythemia vera Chronic diastolic heart failure CONSULTS OBTAINED:  None DRUG ALLERGIES:   Allergies  Allergen Reactions  . Sulfamethoxazole-Trimethoprim Nausea And Vomiting    Pt states she is not allergic to this medication. Pending removal of entry   DISCHARGE MEDICATIONS:   Allergies as of 06/18/2018      Reactions   Sulfamethoxazole-trimethoprim Nausea And Vomiting   Pt states she is not allergic to this medication. Pending removal of entry      Medication List    STOP taking these medications   ciprofloxacin 500 MG tablet Commonly known as:  CIPRO   hydroxyurea 500 MG capsule Commonly known as:  HYDREA   polyethylene glycol powder powder Commonly known as:  GLYCOLAX/MIRALAX   predniSONE 10 MG tablet Commonly known as:  DELTASONE     TAKE these medications   amLODipine 5 MG tablet Commonly known as:  NORVASC Take 5 mg by mouth daily.   apixaban 5 MG Tabs tablet Commonly known as:  ELIQUIS Take 1 tablet (5 mg total) by mouth 2 (two) times daily.   aspirin EC 81 MG tablet Take 1 tablet (81 mg total) by mouth daily.   cephALEXin 500 MG capsule Commonly known as:  KEFLEX Take 1 capsule (500 mg total) by mouth every 12 (twelve) hours.   docusate sodium 100 MG capsule Commonly known as:  COLACE Take 100 mg by mouth 2 (two) times daily.   ENSURE Take 1 Can by mouth 3 (three) times daily with meals.   folic acid 952 MCG tablet Commonly known as:  FOLVITE Take 400 mcg by mouth daily.   furosemide 20 MG tablet Commonly known as:  LASIX Take 1 tablet (20 mg total) by mouth daily.   ipratropium-albuterol 0.5-2.5 (3) MG/3ML Soln Commonly known as:  DUONEB Take 3 mLs by nebulization every 4 (four) hours as needed.   JAKAFI 15 MG  tablet Generic drug:  ruxolitinib phosphate TAKE 1 TABLET (15 MG TOTAL) BY MOUTH 2 TIMES DAILY.   potassium chloride 10 MEQ tablet Commonly known as:  K-DUR,KLOR-CON Take 1 tablet (10 mEq total) by mouth daily. T What changed:    when to take this  reasons to take this  additional instructions        DISCHARGE INSTRUCTIONS:  1. F/u with PCP in 1-2 weeks 2. Prescribed keflex bid for a total 7 day course DIET:  Cardiac diet DISCHARGE CONDITION:  Stable ACTIVITY:  Activity as tolerated OXYGEN:  Home Oxygen: No.  Oxygen Delivery: room air DISCHARGE LOCATION:  nursing home   If you experience worsening of your admission symptoms, develop shortness of breath, life threatening emergency, suicidal or homicidal thoughts you must seek medical attention immediately by calling 911 or calling your MD immediately  if symptoms less severe.  You Must read complete instructions/literature along with all the possible adverse reactions/side effects for all the Medicines you take and that have been prescribed to you. Take any new Medicines after you have completely understood and accpet all the possible adverse reactions/side effects.   Please note  You were cared for by a hospitalist during your hospital stay. If you have any questions about your discharge medications or the care you received while you were in the hospital after you are discharged, you can call the unit and asked to speak with the hospitalist on call if the hospitalist that took care of you is not available. Once you are discharged, your primary care physician will handle any further medical issues. Please note that NO REFILLS for any discharge medications will be authorized once you are discharged, as it is imperative that you return to your primary care physician (or establish a relationship with a primary care physician if you do not have one) for your aftercare needs so that they can reassess your need for medications and  monitor your lab values.    On the day of Discharge:  VITAL SIGNS:  Blood pressure 119/62, pulse 71, temperature 97.7 F (36.5 C), temperature source Oral, resp. rate 20, height 5\' 7"  (1.702 m), weight 86.2 kg, SpO2 96 %. PHYSICAL EXAMINATION:  GENERAL:  84 y.o.-year-old patient lying in the bed with no acute distress.  EYES: Pupils equal, round, reactive to light and accommodation. No scleral icterus. Extraocular muscles intact.  HEENT: Head atraumatic, normocephalic. Oropharynx and nasopharynx clear.  NECK:  Supple, no jugular venous distention. No thyroid enlargement, no tenderness.  LUNGS: Normal breath sounds bilaterally, no wheezing, rales,rhonchi or crepitation. No use of accessory muscles of respiration.  CARDIOVASCULAR: S1, S2 normal. No murmurs, rubs, or gallops.  ABDOMEN: Soft, non-tender, non-distended. Bowel sounds present. No organomegaly or mass. Colostomy present without any surrounding erythema. EXTREMITIES: No pedal edema, cyanosis, or clubbing.  NEUROLOGIC: Cranial nerves II through XII are intact. Global weakness. PSYCHIATRIC: The patient is alert and oriented x 3.  SKIN: No obvious rash, lesion, or  ulcer.  DATA REVIEW:   CBC Recent Labs  Lab 06/18/18 0723  WBC 7.5  HGB 9.2*  HCT 29.9*  PLT 494*    Chemistries  Recent Labs  Lab 06/15/18 1240  06/18/18 0723  NA 142   < > 144  K 4.2   < > 4.2  CL 106   < > 105  CO2 27   < > 34*  GLUCOSE 121*   < > 96  BUN 10   < > 15  CREATININE 0.64   < > 0.60  CALCIUM 9.0   < > 8.9  AST 28  --   --   ALT 10  --   --   ALKPHOS 71  --   --   BILITOT 0.9  --   --    < > = values in this interval not displayed.     Microbiology Results  Results for orders placed or performed during the hospital encounter of 06/15/18  Urine culture     Status: Abnormal   Collection Time: 06/15/18 12:39 PM  Result Value Ref Range Status   Specimen Description   Final    URINE, RANDOM Performed at [facility], [address], [place], [place] [address]    Special Requests   Final    NONE Performed at [facility], [address] [place], [place] [address]    Culture >=100,000 COLONIES/mL ESCHERICHIA COLI (A)  Final   Report Status 06/17/2018 FINAL  Final   Organism ID, Bacteria ESCHERICHIA COLI (A)  Final      Susceptibility   Escherichia coli - MIC*    AMPICILLIN >=32 RESISTANT Resistant     CEFAZOLIN <=4 SENSITIVE Sensitive     CEFTRIAXONE <=1 SENSITIVE Sensitive     CIPROFLOXACIN >=4 RESISTANT Resistant     GENTAMICIN <=1 SENSITIVE Sensitive     IMIPENEM <=0.25 SENSITIVE Sensitive     NITROFURANTOIN <=16 SENSITIVE Sensitive     TRIMETH/SULFA <=20 SENSITIVE Sensitive     AMPICILLIN/SULBACTAM 8 SENSITIVE Sensitive     PIP/TAZO <=4 SENSITIVE Sensitive     Extended ESBL NEGATIVE Sensitive     * >=100,000 COLONIES/mL ESCHERICHIA COLI    RADIOLOGY:  No results found.   Management plans discussed with the patient, family and they are in agreement.  CODE STATUS: Full Code   TOTAL TIME TAKING CARE OF THIS PATIENT: 35 minutes.    Berna Spare Mayo M.D on 06/18/2018 at 2:22 PM  Between 7am to 6pm - Pager [phone]  After 6pm go to [email or web address] - Proofreader  [facility]  Office  [phone]  CC: Primary care physician; [name], MD   Note: This dictation was prepared with Dragon dictation along with smaller phrase technology. Any transcriptional errors that result from this process are unintentional.

## 2018-06-18 NOTE — Discharge Instructions (Signed)
It was so nice to meet you during your hospitalization!  You came into the hospital because you were feeling weak. We found that you had a UTI. We treated you with IV antibiotics and then switched you to an oral antibiotic called Keflex. Please take this twice a day until the antibiotic runs out. You should start taking it tonight.  -Dr. Brett Albino

## 2018-06-18 NOTE — Clinical Social Work Placement (Signed)
   CLINICAL SOCIAL WORK PLACEMENT  NOTE  Date:  06/18/2018  Patient Details  Name: Yolanda Wells MRN: [id number] Date of Birth: 20-Oct-1932  Clinical Social Work is seeking post-discharge placement for this patient at the [place] level of care (*CSW will initial, date and re-position this form in  chart as items are completed):  Yes   Patient/family provided with [facility] Work Department's list of facilities offering this level of care within the geographic area requested by the patient (or if unable, by the patient's family).  Yes   Patient/family informed of their freedom to choose among providers that offer the needed level of care, that participate in Medicare, Medicaid or managed care program needed by the patient, have an available bed and are willing to accept the patient.  Yes   Patient/family informed of [facility]'s ownership interest in [facility] and [facility], as well as of the fact that they are under no obligation to receive care at these facilities.  PASRR submitted to EDS on       PASRR number received on       Existing PASRR number confirmed on 06/18/18     FL2 transmitted to all facilities in geographic area requested by pt/family on 06/16/18     FL2 transmitted to all facilities within larger geographic area on       Patient informed that his/her managed care company has contracts with or will negotiate with certain facilities, including the following:        Yes   Patient/family informed of bed offers received.  Patient chooses bed at [facility])     Physician recommends and patient chooses bed at (snf)    Patient to be transferred to ([facility]) on 06/18/18.  Patient to be transferred to facility by (ems)     Patient family notified on 06/18/18 of transfer.  Name of family member notified:  ([place])     PHYSICIAN       Additional Comment:    _______________________________________________ Shela Leff, LCSW 06/18/2018, 11:49 AM

## 2018-06-21 ENCOUNTER — Other Ambulatory Visit
Admission: RE | Admit: 2018-06-21 | Discharge: 2018-06-21 | Disposition: A | Discharge status: Home / Self Care | Payer: Medicare Other | Source: Skilled Nursing Facility | Attending: Family Medicine | Admitting: Family Medicine

## 2018-06-21 DIAGNOSIS — I5032 Chronic diastolic (congestive) heart failure: Secondary | ICD-10-CM | POA: Insufficient documentation

## 2018-06-21 LAB — BRAIN NATRIURETIC PEPTIDE: B Natriuretic Peptide: 30 pg/mL (ref 0.0–100.0)

## 2018-07-01 ENCOUNTER — Encounter: Payer: Self-pay | Admitting: Hematology and Oncology

## 2018-07-07 ENCOUNTER — Other Ambulatory Visit: Payer: Self-pay | Admitting: Urgent Care

## 2018-07-07 ENCOUNTER — Other Ambulatory Visit: Payer: Medicare Other

## 2018-07-07 ENCOUNTER — Ambulatory Visit: Payer: Medicare Other | Admitting: Internal Medicine

## 2018-07-07 DIAGNOSIS — D45 Polycythemia vera: Secondary | ICD-10-CM

## 2018-07-08 ENCOUNTER — Other Ambulatory Visit: Payer: Self-pay | Admitting: Hematology and Oncology

## 2018-07-08 ENCOUNTER — Inpatient Hospital Stay: Payer: Medicare Other | Admitting: Hematology and Oncology

## 2018-07-08 ENCOUNTER — Inpatient Hospital Stay: Payer: Medicare Other | Attending: Hematology and Oncology

## 2018-07-08 DIAGNOSIS — E538 Deficiency of other specified B group vitamins: Secondary | ICD-10-CM

## 2018-07-08 DIAGNOSIS — D649 Anemia, unspecified: Secondary | ICD-10-CM

## 2018-07-08 MED FILL — JAKAFI 15 MG TABLET: 15 | 30 days supply | Qty: 60 | Fill #0

## 2018-07-08 NOTE — Progress Notes (Deleted)
[place] day:  07/08/18  Chief Complaint: Yolanda Wells is a 84 y.o. 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  HPI:   The patient was last seen in the medical oncology clinic on 06/07/2018.  At that time, she was feeling "pretty good".  She was breathing better.  She had recently completed a course of antibiotics for a UTI.  She was on Jakafi 15 mg BID.  Additional labs on 06/07/2018 revealed a ferritin 99, iron saturation of 22%, TIBC 213, B12 of 258, folate 34, TSH 1.823, sed rate of 49, retic of 3.5%,  She was admitted to [facility] from 06/15/2018 - 06/18/2018 with weakness. Urine revealed E coli.   She was treated with meropenem then transitioned to Keflex after cultures and sensitivities returned.  CBC has been followed: 06/07/2018:  Hematocrit 31.0, hemoglobin 9.5, MCV 81.6, platelets 361,000, WBC 7400 with an ANC of 5700. 06/18/2018:  Hematocrit 29.9, hemoglobin 9.2, MCV 80.9, platelets 494,000, and WBC 7500.  During the interim,   Past Medical History:  Diagnosis Date  . Acute deep vein thrombosis (DVT) of distal vein of left lower extremity ([place]) 10/03/2015  . Acute pulmonary embolism ([place]) 10/03/2015  . Anticoagulant long-term use   . Arthritis   . CHF (congestive heart failure) ([place])   . Chronic anticoagulation 10/03/2015  . Collagen vascular disease ([place])   . Colostomy in place [facility]) 10/04/2015  . Deep venous thrombosis ([facility])    right lower extremity  . Dependent edema   . Diverticulosis of intestine with bleeding 04/02/2015  . Glenohumeral arthritis 06/28/2012  . Hammertoe 09/02/2012  . History of bilateral hip replacements 09/02/2012  . History of hysterectomy   . Hypertension   . Hypokalemia   . Inability to ambulate due to hip 10/12/2015  . Mandibular dysfunction   . Onychomycosis 09/02/2012  . Osteoarthritis   . Polycythemia vera ([place])   . Presence of  IVC filter 05/25/2015  . 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 Urinary incontinence in female 09/02/2016    Past Surgical History:  Procedure Laterality Date  . ABDOMINAL HYSTERECTOMY    . BLEPHAROPLASTY Right 03/2017   right upper eyelid  . CARDIAC CATHETERIZATION Right 04/03/2015   Procedure: CENTRAL LINE INSERTION;  Surgeon: Sherri Rad, MD;  Location: [facility] ORS;  Service: General;  Laterality: Right;  . COLECTOMY WITH COLOSTOMY CREATION/HARTMANN PROCEDURE N/A 04/03/2015   Procedure: COLECTOMY WITH COLOSTOMY CREATION/HARTMANN PROCEDURE;  Surgeon: Sherri Rad, MD;  Location: [facility] ORS;  Service: General;  Laterality: N/A;  . COLON SURGERY    . FOOT AMPUTATION     partial  . FOOT AMPUTATION Right   . JOINT REPLACEMENT     Left and Right Hip  . PERIPHERAL VASCULAR CATHETERIZATION N/A 04/02/2015   Procedure: Visceral Angiography;  Surgeon: Algernon Huxley, MD;  Location: [place] [facility];  Service: Cardiovascular;  Laterality: N/A;  . PERIPHERAL VASCULAR CATHETERIZATION N/A 04/02/2015   Procedure: Visceral Artery Intervention;  Surgeon: Algernon Huxley, MD;  Location: [place] [facility];  Service: Cardiovascular;  Laterality: N/A;  . PERIPHERAL VASCULAR CATHETERIZATION N/A 05/25/2015   Procedure: IVC Filter Insertion;  Surgeon: Katha Cabal, MD;  Location: [place] [facility];  Service: Cardiovascular;  Laterality: N/A;  . TOE AMPUTATION     right  . TOTAL HIP ARTHROPLASTY      Family History  Problem Relation Age of Onset  .  Brain cancer Mother   . Heart attack Father   . COPD Father   . Coronary artery disease Father   . Hypertension Unknown   . Arthritis-Osteo Unknown   . Diabetes Brother   . Arthritis Brother   . Osteosarcoma Son   . Bone cancer Son   . Arthritis Sister   . Arthritis Brother   . Hypertension Brother   . Coronary artery disease Brother     Social History:  reports that she has never smoked. She has never used smokeless tobacco. She reports that she does  not drink alcohol or use drugs.  She lives alone on [address] at [facility] (independent living).  She moved out of [facility].  She has been living with her grand daughter since 02/06/2017.  Her grand-daughter visits regularly [name] [phone]).  The patient is accompanied by her grand daughter, [name], today.   Allergies:  Allergies  Allergen Reactions  . Sulfamethoxazole-Trimethoprim Nausea And Vomiting    Pt states she is not allergic to this medication. Pending removal of entry    Current Medications: Current Outpatient Medications  Medication Sig Dispense Refill  . amLODipine (NORVASC) 5 MG tablet Take 5 mg by mouth daily.    [name] apixaban (ELIQUIS) 5 MG TABS tablet Take 1 tablet (5 mg total) by mouth 2 (two) times daily. 120 tablet 1  . aspirin EC 81 MG tablet Take 1 tablet (81 mg total) by mouth daily. 30 tablet 2  . cephALEXin (KEFLEX) 500 MG capsule Take 1 capsule (500 mg total) by mouth every 12 (twelve) hours. 7 capsule 0  . docusate sodium (COLACE) 100 MG capsule Take 100 mg by mouth 2 (two) times daily.     [name] ENSURE (ENSURE) Take 1 Can by mouth 3 (three) times daily with meals.    . folic acid (FOLVITE) 094 MCG tablet Take 400 mcg by mouth daily.    . furosemide (LASIX) 20 MG tablet Take 1 tablet (20 mg total) by mouth daily. 30 tablet 6  . ipratropium-albuterol (DUONEB) 0.5-2.5 (3) MG/3ML SOLN Take 3 mLs by nebulization every 4 (four) hours as needed.    [name] JAKAFI 15 MG tablet TAKE 1 TABLET (15 MG TOTAL) BY MOUTH 2 TIMES DAILY. 60 tablet 1  . potassium chloride (K-DUR,KLOR-CON) 10 MEQ tablet Take 1 tablet (10 mEq total) by mouth daily. T 30 tablet 0   No current facility-administered medications for this visit.     Review of Systems  Constitutional: Negative for diaphoresis, fever, malaise/fatigue and weight loss.  HENT: Negative.   Eyes: Positive for pain and redness.       Decreased vision in the RIGHT eye s/p embolic events.  Respiratory: Positive for  shortness of breath (exertional). Negative for cough, hemoptysis and sputum production.   Cardiovascular: Negative for chest pain, palpitations, orthopnea, leg swelling and PND.  Gastrointestinal: Negative for abdominal pain, blood in stool, constipation, diarrhea, melena, nausea and vomiting.       Colostomy  Genitourinary: Negative for dysuria, frequency, hematuria and urgency.       Incontinence; wears briefs. Recurrent UTIs.   Musculoskeletal: Negative for back pain, falls, joint pain and myalgias.  Skin: Negative for itching and rash.  Neurological: Negative for dizziness, tremors, weakness and headaches.  Endo/Heme/Allergies: Bruises/bleeds easily (on apixaban).  Psychiatric/Behavioral: Negative for depression, memory loss and suicidal ideas. The patient is not nervous/anxious and does not have insomnia.   All other systems reviewed and are negative.  Performance status (ECOG): 3 -  Symptomatic, >50% confined to bed  Vital Signs There were no vitals taken for this visit.  Physical Exam  Constitutional: She is oriented to person, place, and time and well-developed, well-nourished, and in no distress.  HENT:  Head: Normocephalic and atraumatic.  Grey hair  Eyes: Pupils are equal, round, and reactive to light. EOM are normal. No scleral icterus.  Brown eyes  Neck: Normal range of motion. Neck supple. No tracheal deviation present. No thyromegaly present.  Cardiovascular: Normal rate, regular rhythm and normal heart sounds. Exam reveals no gallop and no friction rub.  No murmur heard. Pulmonary/Chest: Effort normal and breath sounds normal. No respiratory distress. She has no wheezes. She has no rales.  Abdominal: Soft. Bowel sounds are normal. She exhibits no distension. There is no tenderness.  LEFT sided colostomy with brown liquid stool noted.  Musculoskeletal: Normal range of motion. She exhibits edema (chronic BLE). She exhibits no tenderness.  Lymphadenopathy:    She has no  cervical adenopathy.    She has no axillary adenopathy.       Right: No inguinal and no supraclavicular adenopathy present.       Left: No inguinal and no supraclavicular adenopathy present.  Neurological: She is alert and oriented to person, place, and time.  Skin: Skin is warm and dry. No rash noted. No erythema.  Psychiatric: Mood, affect and judgment normal.  Nursing note and vitals reviewed.   No visits with results within 3 Day(s) from this visit.  Latest known visit with results is:  Hospital Outpatient Visit on 06/21/2018  Component Date Value Ref Range Status  . B Natriuretic Peptide 06/21/2018 30.0  0.0 - 100.0 pg/mL Final   Performed at [facility], [address] [place], [place] [address]    Assessment:  Yolanda Wells is a 84 y.o. female African-American woman with JAK2+ polycythemia rubra vera (PV) previously on a phlebotomy program and hydroxyurea. She received P32 in an attempt to manage her counts in 03/2015 and on 05/14/2017.  Bone marrow on 01/07/2018 revealed a hypercellular bone marrow with a myeloproliferative neoplasm.  There was mild to moderate reticulin fibrosis.  Flow cytometry revealed no increased blasts.  Cytogenetics revealed 61, XX, der(15)t(1;15)(q11;p11).  The der 15t(1;15) has been associated with polycythemia, AML, and MDS.  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 She was diagnosed with bilateral lower extremity DVTs on 05/18/2015 and bilateral pulmonary emboli on 05/24/2015. She underwent IVC filter placement on 05/25/2015.  She has been on a fluctuating dose of Coumadin secondary to unstable INR.  She is on a baby aspirin.  She developed progressive erythrocytosis, thrombocytosis, and leukocytosis.  She underwent phlebotomy for a hematocrit of 55.4 on 09/23/2015.  Hematocrit decreased to 50.0, but has again increased after initiation  of oral iron.  Platelet count has increased from 1.1 million to 1.4 million.  White count increased from 23,000 - 28,000 to 31,800.  She was admitted on 08/25/2016 with loss of vision in her right eye.  CBC on 08/25/2016 revealed a hematocrit of 40.0, hemoglobin 13.5, MCV 91, platelets 842,000, WBC 4900.  INR was 2.59.  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  MRI and MRA showed no acute changes.  She has been back on hydroxyurea since 10/02/2015.  Initial dose was 1000 mg a day.  Maximum dose was 4 pills/day (total weekly dose: 28 pills).   Hydroxyurea was discontinued on 03/29/2018.  She began Jakafi 10 mg BID on 02/02/2018 (increased to 15 mg BID on 03/10/2018).  She requires periodic phlebotomies (goal hematocrit <= 42).  Platelet count remains elevated (secondary to PV and likely some component of iron deficiency).  Goal platelet count is 400,000.  She was on Coumadin (discontinued secondary to clot and subtherapeutic INR).  She began Eliquis on 02/05/2018.  She was admitted to [facility] for 2.5 weeks with pneumonia then returned 07/03/2017 - 07/12/2017 with CHF.  She was admitted to [facility] from 09/26/2017 - 10/09/2017 with symptoms of heart failure and an E coli UTI.  She was treated with Lasix and ceftriaxone.  Echo revealed an EF of 60%.  CXR suggested pulmonary artery hypertension.    She was admitted to [facility] from 02/23/2018 - 02/26/2018 with abdominal pain and hypoxia.  She was felt to have decompensated CHF.  She was diuresed and her hypoxia resolved.  Urine culture was + E coli.  She was discharged on a 5 day course of Omnicef.  Symptomatically,  she feels fatigued.  She had brief interval nosebleed.  Exam is stable. Hemoglobin is 10.3.  Platelet count is 437,000.  Plan: 1. Labs today:  CBC with diff, CMP, ferritin, B12. 2.  3. Labs today:  CBC with diff, CMP, ferritin, iron studies, sed rate, TSH, B12, folate, retic. 4. Discuss recent  ED visits. 5. Discuss anemia. Additional work up being added today to further assess.  6. Re: JAK2 (+) PV. Platelets stable at XXX. Goal is to maintain a platelet count of < 400,000 and a hematocrit of < 42. Continue Jakafi 15 mg BID.  7. Re: anticoagulation therapy. No significant bruising or bleeding. INR on 05/17/2018 was 1.94. Continue apixaban 5 mg BID as previously prescribed.  8. Continue routine lab monitoring with home health. Needs new orders for CBC with diff every XXX to be sent to home health agency.  9. RTC in XXX for MD assessment and labs (CBC with diff, CMP).   Lequita Asal, MD  07/08/18, 5:59 AM   I saw and evaluated the patient, participating in the key portions of the service and reviewing pertinent diagnostic studies and records.  I reviewed the nurse practitioner's note and agree with the findings and the plan.  The assessment and plan were discussed with the patient.  Additional diagnostic studies of *** are needed to clarify *** and would change the clinical management.  A few ***multiple questions were asked by the patient and answered.   Nolon Stalls, MD 07/08/2018,5:59 AM

## 2018-07-12 ENCOUNTER — Encounter: Payer: Self-pay | Admitting: Hematology and Oncology

## 2018-07-12 ENCOUNTER — Other Ambulatory Visit: Payer: Self-pay | Admitting: Urgent Care

## 2018-07-20 ENCOUNTER — Telehealth: Payer: Self-pay | Admitting: Pharmacist

## 2018-07-20 NOTE — Telephone Encounter (Signed)
Oral Chemotherapy Pharmacist Encounter   Attempted to reach patient for follow up on oral medication: Jakafi No answer, unable to LVM for patient to call back.   Darl Pikes, PharmD, BCPS, [facility] Hematology/Oncology Clinical Pharmacist [facility] [place] [facility] [phone]  07/20/2018 3:13 PM

## 2018-08-02 ENCOUNTER — Ambulatory Visit: Payer: Self-pay | Admitting: Urology

## 2018-08-04 ENCOUNTER — Inpatient Hospital Stay: Payer: Medicare Other | Admitting: Hematology and Oncology

## 2018-08-04 ENCOUNTER — Inpatient Hospital Stay: Payer: Medicare Other

## 2018-08-04 ENCOUNTER — Other Ambulatory Visit: Payer: Self-pay | Admitting: Hematology and Oncology

## 2018-08-04 DIAGNOSIS — E538 Deficiency of other specified B group vitamins: Secondary | ICD-10-CM | POA: Insufficient documentation

## 2018-08-04 NOTE — Progress Notes (Deleted)
[facility] day:  08/04/18  Chief Complaint: Yolanda Wells is a 84 y.o. 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  HPI:   The patient was last seen in the medical oncology clinic on 06/07/2018.  At that time, she was feeling "pretty good".  She was breathing better.  She had recently completed a course of antibiotics for a UTI.  She was on Jakafi 15 mg BID.  Additional labs on 06/07/2018 revealed a ferritin 99, iron saturation of 22%, TIBC 213, B12 of 258, folate 34, TSH 1.823, sed rate of 49, retic of 3.5%,  She was admitted to [facility] from 06/15/2018 - 06/18/2018 with weakness. Urine revealed E coli.   She was treated with meropenem then transitioned to Keflex after cultures and sensitivities returned.  CBC has been followed: 06/07/2018:  Hematocrit 31.0, hemoglobin 9.5, MCV 81.6, platelets 361,000, WBC 7400 with an ANC of 5700. 06/18/2018:  Hematocrit 29.9, hemoglobin 9.2, MCV 80.9, platelets 494,000, and WBC 7500.  During the interim,   Past Medical History:  Diagnosis Date  . Acute deep vein thrombosis (DVT) of distal vein of left lower extremity ([place]) 10/03/2015  . Acute pulmonary embolism ([place]) 10/03/2015  . Anticoagulant long-term use   . Arthritis   . CHF (congestive heart failure) ([place])   . Chronic anticoagulation 10/03/2015  . Collagen vascular disease ([place])   . Colostomy in place [facility]) 10/04/2015  . Deep venous thrombosis ([facility])    right lower extremity  . Dependent edema   . Diverticulosis of intestine with bleeding 04/02/2015  . Glenohumeral arthritis 06/28/2012  . Hammertoe 09/02/2012  . History of bilateral hip replacements 09/02/2012  . History of hysterectomy   . Hypertension   . Hypokalemia   . Inability to ambulate due to hip 10/12/2015  . Mandibular dysfunction   . Onychomycosis 09/02/2012  . Osteoarthritis   . Polycythemia vera ([place])   . Presence of  IVC filter 05/25/2015  . 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 Urinary incontinence in female 09/02/2016    Past Surgical History:  Procedure Laterality Date  . ABDOMINAL HYSTERECTOMY    . BLEPHAROPLASTY Right 03/2017   right upper eyelid  . CARDIAC CATHETERIZATION Right 04/03/2015   Procedure: CENTRAL LINE INSERTION;  Surgeon: Sherri Rad, MD;  Location: [facility] ORS;  Service: General;  Laterality: Right;  . COLECTOMY WITH COLOSTOMY CREATION/HARTMANN PROCEDURE N/A 04/03/2015   Procedure: COLECTOMY WITH COLOSTOMY CREATION/HARTMANN PROCEDURE;  Surgeon: Sherri Rad, MD;  Location: [facility] ORS;  Service: General;  Laterality: N/A;  . COLON SURGERY    . FOOT AMPUTATION     partial  . FOOT AMPUTATION Right   . JOINT REPLACEMENT     Left and Right Hip  . PERIPHERAL VASCULAR CATHETERIZATION N/A 04/02/2015   Procedure: Visceral Angiography;  Surgeon: Algernon Huxley, MD;  Location: [place] CV LAB;  Service: Cardiovascular;  Laterality: N/A;  . PERIPHERAL VASCULAR CATHETERIZATION N/A 04/02/2015   Procedure: Visceral Artery Intervention;  Surgeon: Algernon Huxley, MD;  Location: [place] [facility];  Service: Cardiovascular;  Laterality: N/A;  . PERIPHERAL VASCULAR CATHETERIZATION N/A 05/25/2015   Procedure: IVC Filter Insertion;  Surgeon: Katha Cabal, MD;  Location: [place] [facility] LAB;  Service: Cardiovascular;  Laterality: N/A;  . TOE AMPUTATION     right  . TOTAL HIP ARTHROPLASTY      Family History  Problem Relation Age of Onset  .  Brain cancer Mother   . Heart attack Father   . COPD Father   . Coronary artery disease Father   . Hypertension Unknown   . Arthritis-Osteo Unknown   . Diabetes Brother   . Arthritis Brother   . Osteosarcoma Son   . Bone cancer Son   . Arthritis Sister   . Arthritis Brother   . Hypertension Brother   . Coronary artery disease Brother     Social History:  reports that she has never smoked. She has never used smokeless tobacco. She reports that she does  not drink alcohol or use drugs.  She lives alone on [address] at [facility] (independent living).  She moved out of [facility].  She has been living with her grand daughter since 02/06/2017.  Her grand-daughter visits regularly [name] [phone]).  The patient is accompanied by her grand daughter, [name], today.   Allergies:  Allergies  Allergen Reactions  . Sulfamethoxazole-Trimethoprim Nausea And Vomiting    Pt states she is not allergic to this medication. Pending removal of entry    Current Medications: Current Outpatient Medications  Medication Sig Dispense Refill  . amLODipine (NORVASC) 5 MG tablet Take 5 mg by mouth daily.    [name] aspirin EC 81 MG tablet Take 1 tablet (81 mg total) by mouth daily. 30 tablet 2  . cephALEXin (KEFLEX) 500 MG capsule Take 1 capsule (500 mg total) by mouth every 12 (twelve) hours. 7 capsule 0  . docusate sodium (COLACE) 100 MG capsule Take 100 mg by mouth 2 (two) times daily.     [name] ELIQUIS 5 MG TABS tablet TAKE ONE TABLET BY MOUTH TWICE A DAY 120 tablet 0  . ENSURE (ENSURE) Take 1 Can by mouth 3 (three) times daily with meals.    . folic acid (FOLVITE) 283 MCG tablet Take 400 mcg by mouth daily.    . furosemide (LASIX) 20 MG tablet Take 1 tablet (20 mg total) by mouth daily. 30 tablet 6  . ipratropium-albuterol (DUONEB) 0.5-2.5 (3) MG/3ML SOLN Take 3 mLs by nebulization every 4 (four) hours as needed.    [name] JAKAFI 15 MG tablet TAKE 1 TABLET (15 MG TOTAL) BY MOUTH 2 TIMES DAILY. 60 tablet 1  . potassium chloride (K-DUR,KLOR-CON) 10 MEQ tablet Take 1 tablet (10 mEq total) by mouth daily. T 30 tablet 0   No current facility-administered medications for this visit.     Review of Systems  Constitutional: Negative for diaphoresis, fever, malaise/fatigue and weight loss.  HENT: Negative.   Eyes: Positive for pain and redness.       Decreased vision in the RIGHT eye s/p embolic events.  Respiratory: Positive for shortness of breath (exertional).  Negative for cough, hemoptysis and sputum production.   Cardiovascular: Negative for chest pain, palpitations, orthopnea, leg swelling and PND.  Gastrointestinal: Negative for abdominal pain, blood in stool, constipation, diarrhea, melena, nausea and vomiting.       Colostomy  Genitourinary: Negative for dysuria, frequency, hematuria and urgency.       Incontinence; wears briefs. Recurrent UTIs.   Musculoskeletal: Negative for back pain, falls, joint pain and myalgias.  Skin: Negative for itching and rash.  Neurological: Negative for dizziness, tremors, weakness and headaches.  Endo/Heme/Allergies: Bruises/bleeds easily (on apixaban).  Psychiatric/Behavioral: Negative for depression, memory loss and suicidal ideas. The patient is not nervous/anxious and does not have insomnia.   All other systems reviewed and are negative.  Performance status (ECOG): 3 - Symptomatic, >50% confined to  bed  Vital Signs There were no vitals taken for this visit.  Physical Exam  Constitutional: She is oriented to person, place, and time and well-developed, well-nourished, and in no distress.  HENT:  Head: Normocephalic and atraumatic.  Grey hair  Eyes: Pupils are equal, round, and reactive to light. EOM are normal. No scleral icterus.  Brown eyes  Neck: Normal range of motion. Neck supple. No tracheal deviation present. No thyromegaly present.  Cardiovascular: Normal rate, regular rhythm and normal heart sounds. Exam reveals no gallop and no friction rub.  No murmur heard. Pulmonary/Chest: Effort normal and breath sounds normal. No respiratory distress. She has no wheezes. She has no rales.  Abdominal: Soft. Bowel sounds are normal. She exhibits no distension. There is no tenderness.  LEFT sided colostomy with brown liquid stool noted.  Musculoskeletal: Normal range of motion. She exhibits edema (chronic BLE). She exhibits no tenderness.  Lymphadenopathy:    She has no cervical adenopathy.    She has no  axillary adenopathy.       Right: No supraclavicular adenopathy present.       Left: No supraclavicular adenopathy present.  Neurological: She is alert and oriented to person, place, and time.  Skin: Skin is warm and dry. No rash noted. No erythema.  Psychiatric: Mood, affect and judgment normal.  Nursing note and vitals reviewed.   No visits with results within 3 Day(s) from this visit.  Latest known visit with results is:  Hospital Outpatient Visit on 06/21/2018  Component Date Value Ref Range Status  . B Natriuretic Peptide 06/21/2018 30.0  0.0 - 100.0 pg/mL Final   Performed at [facility], [address] [place], [place] [address]    Assessment:  Tayona Sarnowski is a 84 y.o. female African-American woman with JAK2+ polycythemia rubra vera (PV) previously on a phlebotomy program and hydroxyurea. She received P32 in an attempt to manage her counts in 03/2015 and on 05/14/2017.  Bone marrow on 01/07/2018 revealed a hypercellular bone marrow with a myeloproliferative neoplasm.  There was mild to moderate reticulin fibrosis.  Flow cytometry revealed no increased blasts.  Cytogenetics revealed 50, XX, der(15)t(1;15)(q11;p11).  The der 15t(1;15) has been associated with polycythemia, AML, and MDS.  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 She was diagnosed with bilateral lower extremity DVTs on 05/18/2015 and bilateral pulmonary emboli on 05/24/2015. She underwent IVC filter placement on 05/25/2015.  She has been on a fluctuating dose of Coumadin secondary to unstable INR.  She is on a baby aspirin.  She developed progressive erythrocytosis, thrombocytosis, and leukocytosis.  She underwent phlebotomy for a hematocrit of 55.4 on 09/23/2015.  Hematocrit decreased to 50.0, but has again increased after initiation of oral iron.  Platelet count has increased from 1.1 million to 1.4  million.  White count increased from 23,000 - 28,000 to 31,800.  She was admitted on 08/25/2016 with loss of vision in her right eye.  CBC on 08/25/2016 revealed a hematocrit of 40.0, hemoglobin 13.5, MCV 91, platelets 842,000, WBC 4900.  INR was 2.59.  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  MRI and MRA showed no acute changes.  She has been back on hydroxyurea since 10/02/2015.  Initial dose was 1000 mg a day.  Maximum dose was 4 pills/day (total weekly dose: 28 pills).   Hydroxyurea was discontinued on 03/29/2018.  She began Jakafi 10 mg BID on 02/02/2018 (increased  to 15 mg BID on 03/10/2018).  She requires periodic phlebotomies (goal hematocrit <= 42).  Platelet count remains elevated (secondary to PV and likely some component of iron deficiency).  Goal platelet count is 400,000.  She was on Coumadin (discontinued secondary to clot and subtherapeutic INR).  She began Eliquis on 02/05/2018.  She was admitted to [facility] for 2.5 weeks with pneumonia then returned 07/03/2017 - 07/12/2017 with CHF.  She was admitted to [facility] from 09/26/2017 - 10/09/2017 with symptoms of heart failure and an E coli UTI.  She was treated with Lasix and ceftriaxone.  Echo revealed an EF of 60%.  CXR suggested pulmonary artery hypertension.    She was admitted to [facility] from 02/23/2018 - 02/26/2018 with abdominal pain and hypoxia.  She was felt to have decompensated CHF.  She was diuresed and her hypoxia resolved.  Urine culture was + E coli.  She was discharged on a 5 day course of Omnicef.  She was admitted to [facility] from 06/15/2018 - 06/18/2018 with weakness. Urine revealed E coli.   She was treated with meropenem then transitioned to Keflex  Symptomatically,  she feels fatigued.  She had brief interval nosebleed.  Exam is stable. Hemoglobin is 10.3.  Platelet count is 437,000.  Plan: 1. Labs today:  CBC with diff, CMP, ferritin, B12. 2. Polycythemia rubra  vera: 3. B12 deficiency:   4. Labs today:  CBC with diff, CMP, ferritin, iron studies, sed rate, TSH, B12, folate, retic. 5. Discuss recent ED visits. 6. Discuss anemia. Additional work up being added today to further assess.  7. Re: JAK2 (+) PV. Platelets stable at XXX. Goal is to maintain a platelet count of < 400,000 and a hematocrit of < 42. Continue Jakafi 15 mg BID.  8. Re: anticoagulation therapy. No significant bruising or bleeding. INR on 05/17/2018 was 1.94. Continue apixaban 5 mg BID as previously prescribed.  9. Continue routine lab monitoring with home health. Needs new orders for CBC with diff every XXX to be sent to home health agency.  10. RTC in XXX for MD assessment and labs (CBC with diff, CMP).   Lequita Asal, MD  08/04/18, 6:24 AM   I saw and evaluated the patient, participating in the key portions of the service and reviewing pertinent diagnostic studies and records.  I reviewed the nurse practitioner's note and agree with the findings and the plan.  The assessment and plan were discussed with the patient.  Additional diagnostic studies of *** are needed to clarify *** and would change the clinical management.  A few ***multiple questions were asked by the patient and answered.   Nolon Stalls, MD 08/04/2018,6:24 AM

## 2018-08-05 ENCOUNTER — Ambulatory Visit: Payer: Medicare Other | Admitting: Hematology and Oncology

## 2018-08-05 ENCOUNTER — Other Ambulatory Visit: Payer: Medicare Other

## 2018-08-09 ENCOUNTER — Other Ambulatory Visit: Payer: Self-pay

## 2018-08-09 ENCOUNTER — Encounter: Payer: Self-pay | Admitting: Urology

## 2018-08-09 ENCOUNTER — Telehealth: Payer: Self-pay | Admitting: Urology

## 2018-08-09 ENCOUNTER — Ambulatory Visit (INDEPENDENT_AMBULATORY_CARE_PROVIDER_SITE_OTHER): Payer: Medicare Other | Admitting: Urology

## 2018-08-09 VITALS — BP 134/8 | HR 66 | Ht 67.0 in | Wt 202.0 lb

## 2018-08-09 DIAGNOSIS — N952 Postmenopausal atrophic vaginitis: Secondary | ICD-10-CM

## 2018-08-09 DIAGNOSIS — N390 Urinary tract infection, site not specified: Secondary | ICD-10-CM

## 2018-08-09 LAB — BLADDER SCAN AMB NON-IMAGING: Scan Result: 57

## 2018-08-09 MED ORDER — ESTRADIOL 0.1 MG/GM VA CREA: TOPICAL_CREAM | VAGINAL | 12 refills | Status: AC

## 2018-08-09 MED ORDER — ESTROGENS, CONJUGATED 0.625 MG/GM VA CREA: 1.0000 | TOPICAL_CREAM | Freq: Every day | VAGINAL | 12 refills | Status: DC

## 2018-08-09 NOTE — Patient Instructions (Signed)
I have given you two prescriptions for a vaginal estrogen cream.  Estrace and Premarin.  Please take these to your pharmacy and see which one your insurance covers.  If both are too expensive, please call the office at [phone] for an alternative.  You are given a sample of vaginal estrogen cream Premarin and instructed to apply 0.5mg (pea-sized amount)  just inside the vaginal introitus with a finger-tip on Monday, Wednesday and Friday nights,     

## 2018-08-09 NOTE — Progress Notes (Signed)
08/09/2018 3:16 PM   Yolanda Wells Yolanda Wells 08-21-1932 [id number]  Referring provider: [name], MD [address] [place], [place] [address]  Chief Complaint  Patient presents with  . Recurrent UTI    HPI: Patient is a 84 -year-old [place] American female who is referred to [place] by [facility] for recurrent urinary tract infections with her granddaughter, [name].    She has been having an UTI every month.    Reviewing her records,  she has had a documented positive urine cultures. + E. coli resistant to ampicillin and Cipro on June 25, 2018 + E. coli resistant to Cipro on May 17, 2018 + E. coli resistant to ampicillin and Cipro on Apr 06, 2018 + E. coli resistant to ampicillin and Cipro on February 23, 2018 + Pseudomonas aeruginosa resistant to cefepime and ceftazidime on January 08, 2018 + E. coli resistant to ampicillin and Cipro on December 29, 2017 + E. coli resistant to Cipro on November 23, 2017 + E. coli resistant to Cipro on October 06, 2017 + E.coli resistant to Cipro and Levaquin on 08/03/2018  Granddaughter states that these are from straight cath specimens  Her symptoms with a urinary tract infection consist of dysuria and confusion.    She is incontinence and is wearing three depends daily.    She denies/endorses dysuria, gross hematuria, suprapubic pain, back pain, abdominal pain or flank pain associated with UTI's.    She has not had any recent fevers, chills, nausea or vomiting associated with UTI's.   She does not have a history of nephrolithiasis, GU surgery or GU trauma.   She is not sexually active.    She is postmenopausal.   She denies constipation and/or diarrhea.   She does/does not use tampons.  She does not engage in good perineal hygiene. She does not take tub baths.   Contrast CT on April 13, 2018 noted no obstructing stone or hydronephrosis.  Right kidney distorted by 3 large cysts. Single left renal cyst. No worrisome renal or adrenal  lesion.  She is drinking 3/4 of the hospital jug of water daily.  She drinks one glass of cranberry juice daily.  She is drinking one cup of coffee daily.  No sodas.  No teas.  No alcohol.   She does not take probiotics.    Her PVR is 57 mL.      PMH: Past Medical History:  Diagnosis Date  . Acute deep vein thrombosis (DVT) of distal vein of left lower extremity ([facility]) 10/03/2015  . Acute pulmonary embolism ([facility]) 10/03/2015  . Anticoagulant long-term use   . Arthritis   . Arthritis   . CHF (congestive heart failure) ([facility])   . Chronic anticoagulation 10/03/2015  . Collagen vascular disease ([facility])   . Colostomy in place [facility]) 10/04/2015  . Deep venous thrombosis ([facility])    right lower extremity  . Dependent edema   . Diverticulosis of intestine with bleeding 04/02/2015  . Glenohumeral arthritis 06/28/2012  . Hammertoe 09/02/2012  . History of bilateral hip replacements 09/02/2012  . History of hysterectomy   . Hypertension   . Hypertension   . Hypokalemia   . Inability to ambulate due to hip 10/12/2015  . Mandibular dysfunction   . Onychomycosis 09/02/2012  . Osteoarthritis   . Polycythemia vera ([facility])   . Presence of IVC filter 05/25/2015  . Stroke ([facility]) 03/26/2015   cerebellar  . Urinary incontinence in female 09/02/2016    Surgical History: Past Surgical History:  Procedure Laterality Date  . ABDOMINAL HYSTERECTOMY    . BLEPHAROPLASTY Right 03/2017   right upper eyelid  . CARDIAC CATHETERIZATION Right 04/03/2015   Procedure: CENTRAL LINE INSERTION;  Surgeon: Sherri Rad, MD;  Location: [facility];  Service: General;  Laterality: Right;  . COLECTOMY WITH COLOSTOMY CREATION/HARTMANN PROCEDURE N/A 04/03/2015   Procedure: COLECTOMY WITH COLOSTOMY CREATION/HARTMANN PROCEDURE;  Surgeon: Sherri Rad, MD;  Location: [facility];  Service: General;  Laterality: N/A;  . COLON SURGERY    . FOOT AMPUTATION     partial  . FOOT AMPUTATION Right   . JOINT REPLACEMENT     Left and Right Hip   . PERIPHERAL VASCULAR CATHETERIZATION N/A 04/02/2015   Procedure: Visceral Angiography;  Surgeon: Algernon Huxley, MD;  Location: [facility];  Service: Cardiovascular;  Laterality: N/A;  . PERIPHERAL VASCULAR CATHETERIZATION N/A 04/02/2015   Procedure: Visceral Artery Intervention;  Surgeon: Algernon Huxley, MD;  Location: [facility];  Service: Cardiovascular;  Laterality: N/A;  . PERIPHERAL VASCULAR CATHETERIZATION N/A 05/25/2015   Procedure: IVC Filter Insertion;  Surgeon: Katha Cabal, MD;  Location: [facility];  Service: Cardiovascular;  Laterality: N/A;  . TOE AMPUTATION     right  . TOTAL HIP ARTHROPLASTY      Home Medications:  Allergies as of 08/09/2018      Reactions   Sulfamethoxazole-trimethoprim Nausea And Vomiting   Pt states she is not allergic to this medication. Pending removal of entry      Medication List        Accurate as of 08/09/18  3:16 PM. Always use your most recent med list.          amLODipine 5 MG tablet Commonly known as:  NORVASC Take 5 mg by mouth daily.   aspirin EC 81 MG tablet Take 1 tablet (81 mg total) by mouth daily.   calcium-vitamin D 250-125 MG-UNIT tablet Commonly known as:  OSCAL WITH D Take 1 tablet by mouth daily.   cholecalciferol 1000 units tablet Commonly known as:  VITAMIN D Take 1,000 Units by mouth daily.   conjugated estrogens vaginal cream Commonly known as:  PREMARIN Place 1 Applicatorful vaginally daily. Apply 0.'5mg'$  (pea-sized amount)  just inside the vaginal introitus with a finger-tip on  Monday, Wednesday and Friday nights.   docusate sodium 100 MG capsule Commonly known as:  COLACE Take 100 mg by mouth 2 (two) times daily.   ELIQUIS 5 MG Tabs tablet Generic drug:  apixaban TAKE ONE TABLET BY MOUTH TWICE A DAY   estradiol 0.1 MG/GM vaginal cream Commonly known as:  ESTRACE Apply 0.'5mg'$  (pea-sized amount)  just inside the vaginal introitus with a finger-tip on Monday, Wednesday and  Friday nights.   folic acid 253 MCG tablet Commonly known as:  FOLVITE Take 400 mcg by mouth daily.   furosemide 20 MG tablet Commonly known as:  LASIX Take 1 tablet (20 mg total) by mouth daily.   ipratropium-albuterol 0.5-2.5 (3) MG/3ML Soln Commonly known as:  DUONEB Take 3 mLs by nebulization every 4 (four) hours as needed.   JAKAFI 15 MG tablet Generic drug:  ruxolitinib phosphate TAKE 1 TABLET (15 MG TOTAL) BY MOUTH 2 TIMES DAILY.   potassium chloride 10 MEQ tablet Commonly known as:  K-DUR,KLOR-CON Take 1 tablet (10 mEq total) by mouth daily. T       Allergies:  Allergies  Allergen Reactions  . Sulfamethoxazole-Trimethoprim Nausea And Vomiting    Pt states she  is not allergic to this medication. Pending removal of entry    Family History: Family History  Problem Relation Age of Onset  . Brain cancer Mother   . Heart attack Father   . COPD Father   . Coronary artery disease Father   . Hypertension Unknown   . Arthritis-Osteo Unknown   . Diabetes Brother   . Arthritis Brother   . Osteosarcoma Son   . Bone cancer Son   . Arthritis Sister   . Arthritis Brother   . Hypertension Brother   . Coronary artery disease Brother   . Malignant hyperthermia Brother     Social History:  reports that she has never smoked. She has never used smokeless tobacco. She reports that she does not drink alcohol or use drugs.  ROS: UROLOGY Frequent Urination?: No Hard to postpone urination?: No Burning/pain with urination?: Yes Get up at night to urinate?: No Leakage of urine?: Yes Urine stream starts and stops?: No Trouble starting stream?: No Do you have to strain to urinate?: No Blood in urine?: No Urinary tract infection?: Yes Sexually transmitted disease?: No Injury to kidneys or bladder?: No Painful intercourse?: No Weak stream?: No Currently pregnant?: No Vaginal bleeding?: No Last menstrual period?: n  Gastrointestinal Nausea?: No Vomiting?:  No Indigestion/heartburn?: No Diarrhea?: No Constipation?: No  Constitutional Fever: No Night sweats?: No Weight loss?: No Fatigue?: No  Skin Skin rash/lesions?: No Itching?: No  Eyes Blurred vision?: No Double vision?: No  Ears/Nose/Throat Sore throat?: No Sinus problems?: No  Hematologic/Lymphatic Swollen glands?: No Easy bruising?: No  Cardiovascular Leg swelling?: No Chest pain?: No  Respiratory Cough?: No Shortness of breath?: Yes  Endocrine Excessive thirst?: No  Musculoskeletal Back pain?: No Joint pain?: No  Neurological Headaches?: No Dizziness?: No  Psychologic Depression?: No Anxiety?: No  Physical Exam: BP (!) 134/8 (BP Location: Left Arm, Patient Position: Sitting, Cuff Size: Normal)   Pulse 66   Ht '5\' 7"'$  (1.702 m)   Wt 202 lb (91.6 kg) Comment: pt stated  BMI 31.64 kg/m   Constitutional:  Well nourished. Alert, No acute distress. HEENT: [place] AT, moist mucus membranes.  Trachea midline, no masses. Cardiovascular: No clubbing, cyanosis, or edema. Respiratory: Normal respiratory effort, no increased work of breathing. GI: Abdomen is soft, non tender, non distended, no abdominal masses. Liver and spleen not palpable.  No hernias appreciated.  Stool sample for occult testing is not indicated.   Colostomy present in the LLQ.   GU: No CVA tenderness.  No bladder fullness or masses.  Atrophic external genitalia, normal pubic hair distribution, no lesions.  Normal urethral meatus, no lesions, no prolapse, no discharge.   No urethral masses, tenderness and/or tenderness. No bladder fullness, tenderness or masses. Pale vagina mucosa, poor estrogen effect, no discharge, no lesions, fair pelvic support, no cystocele or rectocele noted.  Leakage demonstrated with Valsalva.  Cervix and uterus are surgically absent.  No adnexal/parametria masses or tenderness noted.  Anus and perineum are without rashes or lesions.    Skin: No rashes, bruises or suspicious  lesions. Lymph: No cervical or inguinal adenopathy. Neurologic: Grossly intact, no focal deficits, moving all 4 extremities. Psychiatric: Normal mood and affect.  Laboratory Data: Lab Results  Component Value Date   WBC 7.5 06/18/2018   HGB 9.2 (L) 06/18/2018   HCT 29.9 (L) 06/18/2018   MCV 80.9 06/18/2018   PLT 494 (H) 06/18/2018    Lab Results  Component Value Date   CREATININE 0.60 06/18/2018  No results found for: PSA  No results found for: TESTOSTERONE  Lab Results  Component Value Date   HGBA1C 5.3 04/09/2017    Lab Results  Component Value Date   TSH 1.823 06/07/2018       Component Value Date/Time   CHOL 82 08/26/2016 0510   CHOL 64 02/20/2012 0435   HDL 28 (L) 08/26/2016 0510   HDL 10 (L) 02/20/2012 0435   CHOLHDL 2.9 08/26/2016 0510   VLDL 11 08/26/2016 0510   VLDL 12 02/20/2012 0435   LDLCALC 43 08/26/2016 0510   LDLCALC 42 02/20/2012 0435    Lab Results  Component Value Date   AST 28 06/15/2018   Lab Results  Component Value Date   ALT 10 06/15/2018   No components found for: ALKALINEPHOPHATASE No components found for: BILIRUBINTOTAL  No results found for: ESTRADIOL  Urinalysis    Component Value Date/Time   COLORURINE YELLOW (A) 06/15/2018 1239   APPEARANCEUR CLOUDY (A) 06/15/2018 1239   APPEARANCEUR Hazy 07/08/2014 0956   LABSPEC 1.012 06/15/2018 1239   LABSPEC 1.019 07/08/2014 0956   PHURINE 6.0 06/15/2018 1239   GLUCOSEU NEGATIVE 06/15/2018 1239   GLUCOSEU Negative 07/08/2014 0956   HGBUR SMALL (A) 06/15/2018 1239   BILIRUBINUR NEGATIVE 06/15/2018 1239   BILIRUBINUR Negative 07/08/2014 0956   KETONESUR NEGATIVE 06/15/2018 1239   PROTEINUR 30 (A) 06/15/2018 1239   NITRITE POSITIVE (A) 06/15/2018 1239   LEUKOCYTESUR LARGE (A) 06/15/2018 1239   LEUKOCYTESUR 1+ 07/08/2014 0956    I have reviewed the labs.   Pertinent Imaging: CLINICAL DATA:  84 year old female post colostomy 2016. No stool in colostomy bag for 24  hours. Polycythemia vera. Weakness. Initial encounter.  EXAM: CT ABDOMEN AND PELVIS WITH CONTRAST  TECHNIQUE: Multidetector CT imaging of the abdomen and pelvis was performed using the standard protocol following bolus administration of intravenous contrast.  CONTRAST:  192m OMNIPAQUE IOHEXOL 300 MG/ML  SOLN  COMPARISON:  02/23/2018 CT.  FINDINGS: Lower chest: Basilar atelectasis/scarring. Cardiomegaly. Mitral valve and mild aortic valve calcifications.  Hepatobiliary: Top-normal size liver. No worrisome hepatic lesion. Four gallstones measuring up to 2 cm. No surrounding inflammation noted by CT.  Pancreas: No pancreatic mass or inflammation.  Spleen: Top normal size spleen. Nonspecific 6 mm low-density structure peripheral aspect of the spleen unchanged.  Adrenals/Urinary Tract: No obstructing stone or hydronephrosis. Right kidney distorted by 3 large cysts. Single left renal cyst. No worrisome renal or adrenal lesion.  Limited evaluation of urinary bladder secondary to streak artifact from hip replacement  Stomach/Bowel: Post colostomy formation left abdomen into which transverse colon traverses. Parastomal hernia contains small bowel. The colon and small bowel which are extending through the colostomy site are narrowed but do not appear to be causing proximal obstruction.  Multiple diverticula descending colon (resection site between transverse colon and descending colon).  Vascular/Lymphatic: Atherosclerotic changes aorta. Aorta is tortuous without focal aneurysm. Narrowing origin celiac artery, superior mesenteric artery, inferior mesenteric artery and right renal artery without large vessel occlusion. Atherosclerotic changes iliac arteries with ectasia.  Inferior vena cava filter is in place.  No adenopathy.  Reproductive: Post hysterectomy.  No worrisome adnexal mass.  Other: No free intraperitoneal air or drainable fluid  collection.  Musculoskeletal: Post bilateral hip replacements. Prominent degenerative changes L2-3 and L3-4. Minimal anterior slip L4 secondary to facet degenerative changes.  IMPRESSION: Post colostomy formation left abdomen into which transverse colon traverses. Parastomal hernia contains small bowel. The colon and small bowel which are extending through  the colostomy site are narrowed but do not appear to be causing proximal obstruction.  Aortic Atherosclerosis (ICD10-I70.0).  Remainder of findings relatively similar to prior exam.   Electronically Signed   By: Genia Del M.D.   On: 04/13/2018 18:15   Result History  Results for CHERYLIN, WAGUESPACK (MRN [id number]) as of 08/09/2018 15:02  Ref. Range 08/09/2018 14:50  Scan Result Unknown 57    I have independently reviewed the films.    Assessment & Plan:    1. Recurrent UTI Criteria for recurrent UTI has been met with 2 or more infections in 6 months or 3 or greater infections in one year  Patient and granddaughter are instructed to increase their water intake until the urine is pale yellow or clear (10 to 12 cups daily)  Patient and granddaughter are instructed to take probiotics (yogurt, oral pills or vaginal suppositories), take cranberry pills or drink the juice and Vitamin C 1,000 mg daily to acidify the urine  Avoid soaking in tubs and wipe front to back after urinating  Granddaughter is quite familiar with antibiotic stewardship and is concerned that her grandmother has been on several antibiotics in the past for these infections, she also has home health come out and straight catheter for the urine for cultures                         Bladder Scan (Post Void Residual) in office   2. Vaginal atrophy I explained to the patient that when women go through menopause and her estrogen levels are severely diminished, the normal vaginal flora will change.  This is due to an increase of the vaginal canal's pH. Because of  this, the vaginal canal may be colonized by bacteria from the rectum instead of the protective lactobacillus.  This, accompanied by the loss of the mucus barrier with vaginal atrophy, is a cause of recurrent urinary tract infections. In some studies, the use of vaginal estrogen cream has been demonstrated to reduce  recurrent urinary tract infections to one a year.  Patient was given a sample of vaginal estrogen cream (Premarin vaginal cream) and instructed to apply 0.'5mg'$  (pea-sized amount)  just inside the vaginal introitus with a finger-tip on Monday, Wednesday and Friday nights.  I explained to the patient that vaginally administered estrogen, which causes only a slight increase in the blood estrogen levels, have fewer contraindications and adverse systemic effects that oral HT. I have also given prescriptions for the Estrace cream and Premarin cream, so that the patient may carry them to the pharmacy to see which one of the branded creams would be most economical for her.  If she finds both medications cost prohibitive, she is instructed to call the office.  We can then call in a compounded vaginal estrogen cream for the patient that may be more affordable.   She will follow up in one month for an exam.          Return in about 1 month (around 09/08/2018) for recheck .  These notes generated with voice recognition software. I apologize for typographical errors.  Zara Council, PA-C  [facility] [address]  [place], [place] [address] [phone]

## 2018-08-09 NOTE — Telephone Encounter (Signed)
Would you call her granddaughter, [name], and tell her we have the final urine culture result.  It is E.coli.  Would she be able to take Septra DS?  Is listed as an allergy, but there is a note stating that it is not an allergy and to remove it from the allergy list.

## 2018-08-10 MED ORDER — SULFAMETHOXAZOLE-TRIMETHOPRIM 800-160 MG PO TABS: 1.0000 | ORAL_TABLET | Freq: Two times a day (BID) | ORAL | 0 refills | Status: DC

## 2018-08-10 NOTE — Telephone Encounter (Signed)
Spoke to granddaughter, she is not allergic to Sulfa ABX. Septra was sent to pharmacy. Patient notified.

## 2018-08-12 ENCOUNTER — Inpatient Hospital Stay
Admission: EM | Admit: 2018-08-12 | Discharge: 2018-08-15 | DRG: 190 | Disposition: A | Discharge status: Home Health | Payer: Medicare Other | Attending: Internal Medicine | Admitting: Internal Medicine

## 2018-08-12 ENCOUNTER — Other Ambulatory Visit: Payer: Self-pay

## 2018-08-12 ENCOUNTER — Encounter: Payer: Self-pay | Admitting: *Deleted

## 2018-08-12 ENCOUNTER — Emergency Department: Payer: Medicare Other

## 2018-08-12 DIAGNOSIS — J441 Chronic obstructive pulmonary disease with (acute) exacerbation: Secondary | ICD-10-CM | POA: Diagnosis present

## 2018-08-12 DIAGNOSIS — Z8673 Personal history of transient ischemic attack (TIA), and cerebral infarction without residual deficits: Secondary | ICD-10-CM

## 2018-08-12 DIAGNOSIS — J209 Acute bronchitis, unspecified: Secondary | ICD-10-CM | POA: Diagnosis present

## 2018-08-12 DIAGNOSIS — Z95828 Presence of other vascular implants and grafts: Secondary | ICD-10-CM | POA: Diagnosis not present

## 2018-08-12 DIAGNOSIS — Z825 Family history of asthma and other chronic lower respiratory diseases: Secondary | ICD-10-CM

## 2018-08-12 DIAGNOSIS — G4730 Sleep apnea, unspecified: Secondary | ICD-10-CM | POA: Diagnosis present

## 2018-08-12 DIAGNOSIS — J9601 Acute respiratory failure with hypoxia: Secondary | ICD-10-CM | POA: Diagnosis present

## 2018-08-12 DIAGNOSIS — Z86711 Personal history of pulmonary embolism: Secondary | ICD-10-CM

## 2018-08-12 DIAGNOSIS — I509 Heart failure, unspecified: Secondary | ICD-10-CM | POA: Diagnosis present

## 2018-08-12 DIAGNOSIS — Z86718 Personal history of other venous thrombosis and embolism: Secondary | ICD-10-CM

## 2018-08-12 DIAGNOSIS — D751 Secondary polycythemia: Secondary | ICD-10-CM | POA: Diagnosis present

## 2018-08-12 DIAGNOSIS — Z833 Family history of diabetes mellitus: Secondary | ICD-10-CM | POA: Diagnosis not present

## 2018-08-12 DIAGNOSIS — Z89421 Acquired absence of other right toe(s): Secondary | ICD-10-CM | POA: Diagnosis not present

## 2018-08-12 DIAGNOSIS — Z933 Colostomy status: Secondary | ICD-10-CM | POA: Diagnosis not present

## 2018-08-12 DIAGNOSIS — I110 Hypertensive heart disease with heart failure: Secondary | ICD-10-CM | POA: Diagnosis present

## 2018-08-12 DIAGNOSIS — L8990 Pressure ulcer of unspecified site, unspecified stage: Secondary | ICD-10-CM

## 2018-08-12 DIAGNOSIS — Z8261 Family history of arthritis: Secondary | ICD-10-CM

## 2018-08-12 DIAGNOSIS — Z79890 Hormone replacement therapy (postmenopausal): Secondary | ICD-10-CM

## 2018-08-12 DIAGNOSIS — Z7982 Long term (current) use of aspirin: Secondary | ICD-10-CM | POA: Diagnosis not present

## 2018-08-12 DIAGNOSIS — Z96643 Presence of artificial hip joint, bilateral: Secondary | ICD-10-CM | POA: Diagnosis present

## 2018-08-12 DIAGNOSIS — Z808 Family history of malignant neoplasm of other organs or systems: Secondary | ICD-10-CM

## 2018-08-12 DIAGNOSIS — Z6831 Body mass index (BMI) 31.0-31.9, adult: Secondary | ICD-10-CM | POA: Diagnosis not present

## 2018-08-12 DIAGNOSIS — J440 Chronic obstructive pulmonary disease with acute lower respiratory infection: Secondary | ICD-10-CM | POA: Diagnosis not present

## 2018-08-12 DIAGNOSIS — R0600 Dyspnea, unspecified: Secondary | ICD-10-CM

## 2018-08-12 DIAGNOSIS — J449 Chronic obstructive pulmonary disease, unspecified: Secondary | ICD-10-CM | POA: Diagnosis present

## 2018-08-12 DIAGNOSIS — Z7901 Long term (current) use of anticoagulants: Secondary | ICD-10-CM

## 2018-08-12 DIAGNOSIS — Z8249 Family history of ischemic heart disease and other diseases of the circulatory system: Secondary | ICD-10-CM | POA: Diagnosis not present

## 2018-08-12 LAB — BASIC METABOLIC PANEL
Anion gap: 7 (ref 5–15)
BUN: 11 mg/dL (ref 8–23)
CHLORIDE: 106 mmol/L (ref 98–111)
CO2: 28 mmol/L (ref 22–32)
CREATININE: 0.55 mg/dL (ref 0.44–1.00)
Calcium: 8.8 mg/dL — ABNORMAL LOW (ref 8.9–10.3)
GFR calc Af Amer: >60 mL/min (ref >60)
GFR calc non Af Amer: >60 mL/min (ref >60)
GLUCOSE: 115 mg/dL — AB (ref 70–99)
POTASSIUM: 4.1 mmol/L (ref 3.5–5.1)
SODIUM: 141 mmol/L (ref 135–145)

## 2018-08-12 LAB — CBC
HEMATOCRIT: 31.3 % — AB (ref 35.0–47.0)
Hemoglobin: 9.8 g/dL — ABNORMAL LOW (ref 12.0–16.0)
MCH: 25.7 pg — AB (ref 26.0–34.0)
MCHC: 31.2 g/dL — AB (ref 32.0–36.0)
MCV: 82.6 fL (ref 80.0–100.0)
Platelets: 446 10*3/uL — ABNORMAL HIGH (ref 150–440)
RBC: 3.79 MIL/uL — ABNORMAL LOW (ref 3.80–5.20)
RDW: 39.6 % — ABNORMAL HIGH (ref 11.5–14.5)
WBC: 11.3 10*3/uL — AB (ref 3.6–11.0)

## 2018-08-12 LAB — BLOOD GAS, ARTERIAL
ACID-BASE EXCESS: 3.2 mmol/L — AB (ref 0.0–2.0)
BICARBONATE: 28.5 mmol/L — AB (ref 20.0–28.0)
FIO2: 0.32
O2 Saturation: 97.2 %
PO2 ART: 93 mmHg (ref 83.0–108.0)
Patient temperature: 37
pCO2 arterial: 46 mmHg (ref 32.0–48.0)
pH, Arterial: 7.4 (ref 7.350–7.450)

## 2018-08-12 LAB — INFLUENZA PANEL BY PCR (TYPE A & B)
Influenza A By PCR: NEGATIVE
Influenza B By PCR: NEGATIVE

## 2018-08-12 LAB — TROPONIN I: Troponin I: <0.03 ng/mL (ref <0.03)

## 2018-08-12 MED ORDER — ASPIRIN EC 81 MG PO TBEC
81.0000 mg | DELAYED_RELEASE_TABLET | Freq: Every day | ORAL | Status: DC
  Administered 2018-08-13 – 2018-08-15 (×3): 81 mg via ORAL
  Filled 2018-08-12 (×3): qty 1

## 2018-08-12 MED ORDER — CALCIUM CARBONATE-VITAMIN D 500-200 MG-UNIT PO TABS
1.0000 | ORAL_TABLET | Freq: Every day | ORAL | Status: DC
  Administered 2018-08-13 – 2018-08-15 (×3): 1 via ORAL
  Filled 2018-08-12 (×3): qty 1

## 2018-08-12 MED ORDER — DOCUSATE SODIUM 100 MG PO CAPS
100.0000 mg | ORAL_CAPSULE | Freq: Two times a day (BID) | ORAL | Status: DC
  Administered 2018-08-12 – 2018-08-14 (×4): 100 mg via ORAL
  Filled 2018-08-12 (×5): qty 1

## 2018-08-12 MED ORDER — IPRATROPIUM-ALBUTEROL 0.5-2.5 (3) MG/3ML IN SOLN
3.0000 mL | Freq: Four times a day (QID) | RESPIRATORY_TRACT | Status: DC
  Administered 2018-08-13: 3 mL via RESPIRATORY_TRACT
  Filled 2018-08-12: qty 3

## 2018-08-12 MED ORDER — IPRATROPIUM-ALBUTEROL 0.5-2.5 (3) MG/3ML IN SOLN
3.0000 mL | Freq: Once | RESPIRATORY_TRACT | Status: AC
  Administered 2018-08-12: 3 mL via RESPIRATORY_TRACT
  Filled 2018-08-12: qty 3

## 2018-08-12 MED ORDER — PREDNISONE 20 MG PO TABS: 40.0000 mg | ORAL_TABLET | Freq: Every day | ORAL | Status: DC

## 2018-08-12 MED ORDER — APIXABAN 5 MG PO TABS
5.0000 mg | ORAL_TABLET | Freq: Two times a day (BID) | ORAL | Status: DC
  Administered 2018-08-12 – 2018-08-15 (×6): 5 mg via ORAL
  Filled 2018-08-12 (×6): qty 1

## 2018-08-12 MED ORDER — DOXYCYCLINE HYCLATE 100 MG PO TABS
100.0000 mg | ORAL_TABLET | Freq: Two times a day (BID) | ORAL | Status: DC
  Administered 2018-08-13: 100 mg via ORAL
  Filled 2018-08-12 (×2): qty 1

## 2018-08-12 MED ORDER — VITAMIN D 1000 UNITS PO TABS
1000.0000 [IU] | ORAL_TABLET | Freq: Every day | ORAL | Status: DC
  Administered 2018-08-13 – 2018-08-15 (×3): 1000 [IU] via ORAL
  Filled 2018-08-12 (×3): qty 1

## 2018-08-12 MED ORDER — METHYLPREDNISOLONE SODIUM SUCC 125 MG IJ SOLR
60.0000 mg | Freq: Four times a day (QID) | INTRAMUSCULAR | Status: DC
  Administered 2018-08-12 – 2018-08-13 (×2): 60 mg via INTRAVENOUS
  Filled 2018-08-12 (×2): qty 2

## 2018-08-12 MED ORDER — ENOXAPARIN SODIUM 40 MG/0.4ML ~~LOC~~ SOLN: 40.0000 mg | SUBCUTANEOUS | Status: DC

## 2018-08-12 MED ORDER — MOMETASONE FURO-FORMOTEROL FUM 200-5 MCG/ACT IN AERO
1.0000 | INHALATION_SPRAY | Freq: Two times a day (BID) | RESPIRATORY_TRACT | Status: DC
  Administered 2018-08-13 – 2018-08-15 (×6): 1 via RESPIRATORY_TRACT
  Filled 2018-08-12: qty 8.8

## 2018-08-12 MED ORDER — METHYLPREDNISOLONE SODIUM SUCC 125 MG IJ SOLR
125.0000 mg | Freq: Once | INTRAMUSCULAR | Status: AC
  Administered 2018-08-12: 125 mg via INTRAVENOUS
  Filled 2018-08-12: qty 2

## 2018-08-12 MED ORDER — AMLODIPINE BESYLATE 5 MG PO TABS
5.0000 mg | ORAL_TABLET | Freq: Every day | ORAL | Status: DC
  Administered 2018-08-13 – 2018-08-15 (×3): 5 mg via ORAL
  Filled 2018-08-12 (×3): qty 1

## 2018-08-12 MED ORDER — ATORVASTATIN CALCIUM 20 MG PO TABS
20.0000 mg | ORAL_TABLET | Freq: Every day | ORAL | Status: DC
  Administered 2018-08-13 – 2018-08-14 (×2): 20 mg via ORAL
  Filled 2018-08-12 (×2): qty 1

## 2018-08-12 MED ORDER — FOLIC ACID 1 MG PO TABS
0.5000 mg | ORAL_TABLET | Freq: Every day | ORAL | Status: DC
  Administered 2018-08-13 – 2018-08-15 (×3): 0.5 mg via ORAL
  Filled 2018-08-12 (×3): qty 1

## 2018-08-12 MED ORDER — RUXOLITINIB PHOSPHATE 15 MG PO TABS: 15.0000 mg | ORAL_TABLET | Freq: Two times a day (BID) | ORAL | Status: DC

## 2018-08-12 MED ORDER — GUAIFENESIN ER 600 MG PO TB12
600.0000 mg | ORAL_TABLET | Freq: Two times a day (BID) | ORAL | Status: DC
  Administered 2018-08-12 – 2018-08-15 (×6): 600 mg via ORAL
  Filled 2018-08-12 (×6): qty 1

## 2018-08-12 MED ORDER — FOLIC ACID 400 MCG PO TABS: 400.0000 ug | ORAL_TABLET | Freq: Every day | ORAL | Status: DC

## 2018-08-12 MED FILL — JAKAFI 15 MG TABLET: 15 | 30 days supply | Qty: 60 | Fill #1

## 2018-08-12 NOTE — Progress Notes (Signed)
Family Meeting Note  Advance Directive:yes  Today a meeting took place with the Patient.  Patient is able to participate   The following clinical team members were present during this meeting:MD  The following were discussed:Patient's diagnosis: COPD exacerbation, Patient's progosis: Unable to determine and Goals for treatment: DNR  Additional follow-up to be provided: prn  Time spent during discussion:20 minutes  Gorden Harms, MD

## 2018-08-12 NOTE — ED Triage Notes (Signed)
Pt brought in by [facility] due to chest pain and shortness of breath this morning. States she no longer has these sx. She was 88% on room air when EMS arrived. She was given 1 Duoneb and 1 Albuterol tx enroute. She was placed on 2 lpm via [place] while receiving breathing tx. Sats increased to 94%. Pt lives at home.

## 2018-08-12 NOTE — ED Provider Notes (Signed)
[facility] Emergency Department Provider Note  Time seen: 5:55 PM  I have reviewed the triage vital signs and the nursing notes.   HISTORY  Chief Complaint Shortness of Breath and Chest Pain    HPI Yolanda Wells is a 84 y.o. female with a past medical history of prior PE on anticoagulation, CHF, hypertension, CVA, presents to the emergency department for shortness of breath.  According to EMS patient was complaining of mild chest pain or shortness of breath this morning.  Patient denies any symptoms at this time but was found to have an room air saturation in the 80s by EMS was given 2 breathing treatments in route to the hospital.  Upon arrival the patient appears well, she is currently on 3 L of nasal cannula maintaining sats in the mid to upper 90s.  Daughter denies any baseline oxygen use.  Patient does have a past history of a pulmonary embolism, but is currently on Eliquis.  Daughter also states a history of pulmonary disease currently being seen by Dr. Raul Del, and has been diagnosed with severe COPD, is a non-smoker but was exposed to secondhand smoke growing up.  She may also have pulmonary hypertension.  Patient denies any chest pain currently, denies any shortness of breath but is currently on 3 L of oxygen.  No leg pain or swelling.   Past Medical History:  Diagnosis Date  . Acute deep vein thrombosis (DVT) of distal vein of left lower extremity ([place]) 10/03/2015  . Acute pulmonary embolism ([place]) 10/03/2015  . Anticoagulant long-term use   . Arthritis   . Arthritis   . CHF (congestive heart failure) ([place])   . Chronic anticoagulation 10/03/2015  . Collagen vascular disease ([place])   . Colostomy in place [facility]) 10/04/2015  . Deep venous thrombosis ([facility])    right lower extremity  . Dependent edema   . Diverticulosis of intestine with bleeding 04/02/2015  . Glenohumeral arthritis 06/28/2012  . Hammertoe 09/02/2012  . History of bilateral hip replacements  09/02/2012  . History of hysterectomy   . Hypertension   . Hypertension   . Hypokalemia   . Inability to ambulate due to hip 10/12/2015  . Mandibular dysfunction   . Onychomycosis 09/02/2012  . Osteoarthritis   . Polycythemia vera ([place])   . Presence of IVC filter 05/25/2015  . Stroke ([place]) 03/26/2015   cerebellar  . Urinary incontinence in female 09/02/2016    Patient Active Problem List   Diagnosis Date Noted  . B12 deficiency 08/04/2018  . Anemia 06/07/2018  . 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 Goals of care, counseling/discussion 12/23/2017  . Palliative care by specialist   . DNR (do not resuscitate) discussion   . Weakness generalized   . Chronic diastolic CHF (congestive heart failure) ([place]) 07/28/2017  . Vascular dementia without behavioral disturbance ([place]) 07/08/2017  . Influenza with respiratory manifestation 12/01/2016  . Respiratory distress 11/23/2016  . Urinary incontinence in female 09/02/2016  . Vision loss of right eye 08/26/2016  . Blindness of right eye 08/26/2016  . Inability to ambulate due to hip 10/12/2015  . Colostomy in place [facility]) 10/04/2015  . Long term current use of anticoagulant therapy 10/03/2015  . Leukocytosis 09/25/2015  . CAP (community acquired pneumonia) 09/24/2015  . COPD (chronic obstructive pulmonary disease) ([place]) 09/24/2015  . UTI (urinary tract infection) 07/16/2015  . Abdominal wall cellulitis 07/15/2015  . DVT (deep venous thrombosis) ([place]) 07/15/2015  . HTN (hypertension) 07/15/2015  . Polycythemia vera ([place]) 07/15/2015  .  Arthritis 07/15/2015  . Pulmonary emboli ([place]) 05/25/2015  . Diverticulosis of colon with hemorrhage 04/02/2015  . Diverticulosis of intestine with bleeding 04/02/2015  . Fall 01/10/2015  . Osteoarthritis 01/10/2015  . Hammertoe 09/02/2012  . S/P transmetatarsal amputation of foot ([place]) 09/02/2012  . Onychomycosis 09/02/2012  . Other specified dermatoses 09/02/2012  . S/P hip replacement  08/23/2012  . Glenohumeral arthritis 06/28/2012    Past Surgical History:  Procedure Laterality Date  . ABDOMINAL HYSTERECTOMY    . BLEPHAROPLASTY Right 03/2017   right upper eyelid  . CARDIAC CATHETERIZATION Right 04/03/2015   Procedure: CENTRAL LINE INSERTION;  Surgeon: Sherri Rad, MD;  Location: [facility] ORS;  Service: General;  Laterality: Right;  . COLECTOMY WITH COLOSTOMY CREATION/HARTMANN PROCEDURE N/A 04/03/2015   Procedure: COLECTOMY WITH COLOSTOMY CREATION/HARTMANN PROCEDURE;  Surgeon: Sherri Rad, MD;  Location: [facility] ORS;  Service: General;  Laterality: N/A;  . COLON SURGERY    . FOOT AMPUTATION     partial  . FOOT AMPUTATION Right   . JOINT REPLACEMENT     Left and Right Hip  . PERIPHERAL VASCULAR CATHETERIZATION N/A 04/02/2015   Procedure: Visceral Angiography;  Surgeon: Algernon Huxley, MD;  Location: [place] [facility];  Service: Cardiovascular;  Laterality: N/A;  . PERIPHERAL VASCULAR CATHETERIZATION N/A 04/02/2015   Procedure: Visceral Artery Intervention;  Surgeon: Algernon Huxley, MD;  Location: [place] [facility];  Service: Cardiovascular;  Laterality: N/A;  . PERIPHERAL VASCULAR CATHETERIZATION N/A 05/25/2015   Procedure: IVC Filter Insertion;  Surgeon: Katha Cabal, MD;  Location: [place] [facility];  Service: Cardiovascular;  Laterality: N/A;  . TOE AMPUTATION     right  . TOTAL HIP ARTHROPLASTY      Prior to Admission medications   Medication Sig Start Date End Date Taking? Authorizing Provider  amLODipine (NORVASC) 5 MG tablet Take 5 mg by mouth daily.    [provider]  aspirin EC 81 MG tablet Take 1 tablet (81 mg total) by mouth daily. 09/16/15   [name], MD  calcium-vitamin D (OSCAL WITH D) 250-125 MG-UNIT tablet Take 1 tablet by mouth daily.    [provider]  cholecalciferol (VITAMIN D) 1000 units tablet Take 1,000 Units by mouth daily.    [provider]  conjugated estrogens (PREMARIN) vaginal cream Place 1  Applicatorful vaginally daily. Apply 0.5mg  (pea-sized amount)  just inside the vaginal introitus with a finger-tip on  Monday, Wednesday and Friday nights. 08/09/18   Zara Council A, PA-C  docusate sodium (COLACE) 100 MG capsule Take 100 mg by mouth 2 (two) times daily.     [provider]  ELIQUIS 5 MG TABS tablet TAKE ONE TABLET BY MOUTH TWICE A DAY 07/12/18   Lequita Asal, MD  estradiol (ESTRACE VAGINAL) 0.1 MG/GM vaginal cream Apply 0.5mg  (pea-sized amount)  just inside the vaginal introitus with a finger-tip on Monday, Wednesday and Friday nights. 08/09/18   Zara Council A, PA-C  folic acid (FOLVITE) 735 MCG tablet Take 400 mcg by mouth daily.    [provider]  furosemide (LASIX) 20 MG tablet Take 1 tablet (20 mg total) by mouth daily. Patient not taking: Reported on 08/09/2018 03/10/18   Darylene Price A, FNP  ipratropium-albuterol (DUONEB) 0.5-2.5 (3) MG/3ML SOLN Take 3 mLs by nebulization every 4 (four) hours as needed.    [provider]  JAKAFI 15 MG tablet TAKE 1 TABLET (15 MG TOTAL) BY MOUTH 2 TIMES DAILY. 07/07/18   Lequita Asal,  MD  potassium chloride (K-DUR,KLOR-CON) 10 MEQ tablet Take 1 tablet (10 mEq total) by mouth daily. T Patient not taking: Reported on 08/09/2018 06/18/18   Mayo, Pete Pelt, MD  sulfamethoxazole-trimethoprim (BACTRIM DS,SEPTRA DS) 800-160 MG tablet Take 1 tablet by mouth every 12 (twelve) hours. 08/10/18   Zara Council A, PA-C    Allergies  Allergen Reactions  . Sulfamethoxazole-Trimethoprim Nausea And Vomiting    Pt states she is not allergic to this medication. Pending removal of entry    Family History  Problem Relation Age of Onset  . Brain cancer Mother   . Heart attack Father   . COPD Father   . Coronary artery disease Father   . Hypertension Unknown   . Arthritis-Osteo Unknown   . Diabetes Brother   . Arthritis Brother   . Osteosarcoma Son   . Bone cancer Son   . Arthritis Sister   . Arthritis  Brother   . Hypertension Brother   . Coronary artery disease Brother   . Malignant hyperthermia Brother     Social History Social History   Tobacco Use  . Smoking status: Never Smoker  . Smokeless tobacco: Never Used  Substance Use Topics  . Alcohol use: No  . Drug use: No    Review of Systems Constitutional: Negative for fever. ENT: Negative for recent illness/congestion Cardiovascular: Negative for chest pain. Respiratory: Positive for shortness of breath over the past 2 days. Gastrointestinal: Negative for abdominal pain, vomiting and diarrhea. Genitourinary: Negative for urinary compaints Musculoskeletal: Negative for lower extremity edema. Skin: Negative for skin complaints  Neurological: Negative for headache All other ROS negative  ____________________________________________   PHYSICAL EXAM:  VITAL SIGNS: ED Triage Vitals  Enc Vitals Group     BP 08/12/18 1630 137/87     Pulse Rate 08/12/18 1630 90     Resp 08/12/18 1630 (!) 22     Temp 08/12/18 1630 98.8 F (37.1 C)     Temp Source 08/12/18 1630 Oral     SpO2 08/12/18 1630 100 %     Weight 08/12/18 1631 201 lb 15.1 oz (91.6 kg)     Height 08/12/18 1631 5\' 7"  (1.702 m)     Head Circumference --      Peak Flow --      Pain Score 08/12/18 1631 0     Pain Loc --      Pain Edu? --      Excl. in [place]? --    Constitutional: Alert. Well appearing and in no distress. Eyes: Normal exam ENT   Head: Normocephalic and atraumatic   Mouth/Throat: Mucous membranes are moist. Cardiovascular: Normal rate, regular rhythm. No murmur Respiratory: Normal respiratory effort without tachypnea nor retractions. Breath sounds are clear  Gastrointestinal: Soft and nontender. No distention.  Musculoskeletal: Nontender with normal range of motion in all extremities. No lower extremity tenderness or edema.  Status post right partial foot amputation. Neurologic:  Normal speech and language. No gross focal neurologic  deficits  Skin:  Skin is warm, dry and intact.  Psychiatric: Mood and affect are normal. Speech and behavior are normal.   ____________________________________________    EKG  EKG reviewed and interpreted by myself shows sinus rhythm 85 bpm with a slightly widened QRS, left axis deviation, largely normal intervals and nonspecific ST changes.  ____________________________________________    RADIOLOGY  Stable changes, no acute disease.  ____________________________________________   INITIAL IMPRESSION / ASSESSMENT AND PLAN / ED COURSE  Pertinent labs & imaging results  that were available during my care of the patient were reviewed by me and considered in my medical decision making (see chart for details).  Patient presents to the emergency department for shortness of breath ongoing over the past 2 days.  Differential would include COPD exacerbation, pneumonia, URI, CHF.  I reviewed the patient's notes and records including recent pulmonology visits in which they believe her shortness of breath is multifactorial due to anxiety, grade 1 CHF, possible diaphragmatic paralysis, significant COPD, as well as possible pulmonary hypertension.  We will treat with duo nebs, Solu-Medrol and continue to closely monitor.  Patient maintaining sats in the upper 90s currently on 3 L of oxygen.  Reassuringly patient's lungs sound fairly clear, chest x-ray shows no acute disease.  Lab work is pending.  His work-up is largely nonrevealing, labs are largely at baseline.  Troponin is negative.  Chest x-ray is clear/stable.  Influenza pending.  However the patient remains hypoxic on room air with no home O2 baseline requirement will admit to the hospitalist service for COPD exacerbation.  Patient agreeable to plan of care.  ____________________________________________   FINAL CLINICAL IMPRESSION(S) / ED DIAGNOSES  Dyspnea COPD exacerbation    Harvest Dark, MD 08/12/18 1916

## 2018-08-12 NOTE — H&P (Addendum)
[facility] NAME: Yolanda Wells    MR#:  [id number]  DATE OF BIRTH:  [birth year]  DATE OF ADMISSION:  08/12/2018  PRIMARY CARE PHYSICIAN: [name], MD   REQUESTING/REFERRING PHYSICIAN:   CHIEF COMPLAINT:   Chief Complaint  Patient presents with  . Shortness of Breath  . Chest Pain    HISTORY OF PRESENT ILLNESS: Yolanda Wells  is a 84 y.o. female with a known history per below presented to the emergency room with acute shortness of breath for 2 days, worse with exertion, associated cough, brought to the emergency room via EMS, O2 saturation 84%, patient also complains of chest tightness, ER work-up noted for normal chest x-ray, white count 11,000, patient evaluated in the emergency room, no apparent distress, resting comfortably in bed, patient stated that her breathing is much better with treatment received received in the emergency room, patient states that she is only on oxygen as needed-had not used in the last 3 weeks, patient is now being admitted for acute on COPD exacerbation.  PAST MEDICAL HISTORY:   Past Medical History:  Diagnosis Date  . Acute deep vein thrombosis (DVT) of distal vein of left lower extremity ([place]) 10/03/2015  . Acute pulmonary embolism ([place]) 10/03/2015  . Anticoagulant long-term use   . Arthritis   . Arthritis   . CHF (congestive heart failure) ([place])   . Chronic anticoagulation 10/03/2015  . Collagen vascular disease ([place])   . Colostomy in place [facility]) 10/04/2015  . Deep venous thrombosis ([facility])    right lower extremity  . Dependent edema   . Diverticulosis of intestine with bleeding 04/02/2015  . Glenohumeral arthritis 06/28/2012  . Hammertoe 09/02/2012  . History of bilateral hip replacements 09/02/2012  . History of hysterectomy   . Hypertension   . Hypertension   . Hypokalemia   . Inability to ambulate due to hip 10/12/2015  . Mandibular dysfunction   . Onychomycosis 09/02/2012  .  Osteoarthritis   . Polycythemia vera ([place])   . Presence of IVC filter 05/25/2015  . Stroke ([place]) 03/26/2015   cerebellar  . Urinary incontinence in female 09/02/2016    PAST SURGICAL HISTORY:  Past Surgical History:  Procedure Laterality Date  . ABDOMINAL HYSTERECTOMY    . BLEPHAROPLASTY Right 03/2017   right upper eyelid  . CARDIAC CATHETERIZATION Right 04/03/2015   Procedure: CENTRAL LINE INSERTION;  Surgeon: Sherri Rad, MD;  Location: [facility] ORS;  Service: General;  Laterality: Right;  . COLECTOMY WITH COLOSTOMY CREATION/HARTMANN PROCEDURE N/A 04/03/2015   Procedure: COLECTOMY WITH COLOSTOMY CREATION/HARTMANN PROCEDURE;  Surgeon: Sherri Rad, MD;  Location: [facility] ORS;  Service: General;  Laterality: N/A;  . COLON SURGERY    . FOOT AMPUTATION     partial  . FOOT AMPUTATION Right   . JOINT REPLACEMENT     Left and Right Hip  . PERIPHERAL VASCULAR CATHETERIZATION N/A 04/02/2015   Procedure: Visceral Angiography;  Surgeon: Algernon Huxley, MD;  Location: [place] [facility];  Service: Cardiovascular;  Laterality: N/A;  . PERIPHERAL VASCULAR CATHETERIZATION N/A 04/02/2015   Procedure: Visceral Artery Intervention;  Surgeon: Algernon Huxley, MD;  Location: [place] CV LAB;  Service: Cardiovascular;  Laterality: N/A;  . PERIPHERAL VASCULAR CATHETERIZATION N/A 05/25/2015   Procedure: IVC Filter Insertion;  Surgeon: Katha Cabal, MD;  Location: [place] CV LAB;  Service: Cardiovascular;  Laterality: N/A;  . TOE AMPUTATION     right  . TOTAL HIP ARTHROPLASTY  SOCIAL HISTORY:  Social History   Tobacco Use  . Smoking status: Never Smoker  . Smokeless tobacco: Never Used  Substance Use Topics  . Alcohol use: No    FAMILY HISTORY:  Family History  Problem Relation Age of Onset  . Brain cancer Mother   . Heart attack Father   . COPD Father   . Coronary artery disease Father   . Hypertension Unknown   . Arthritis-Osteo Unknown   . Diabetes Brother   . Arthritis Brother   .  Osteosarcoma Son   . Bone cancer Son   . Arthritis Sister   . Arthritis Brother   . Hypertension Brother   . Coronary artery disease Brother   . Malignant hyperthermia Brother     DRUG ALLERGIES:  Allergies  Allergen Reactions  . Sulfamethoxazole-Trimethoprim Nausea And Vomiting    Pt states she is not allergic to this medication. Pending removal of entry    REVIEW OF SYSTEMS:   CONSTITUTIONAL: No fever, +fatigue ,weakness.  EYES: No blurred or double vision.  EARS, NOSE, AND THROAT: No tinnitus or ear pain.  RESPIRATORY: + cough, shortness of breath, wheezing   CARDIOVASCULAR: No chest pain, orthopnea, edema.  GASTROINTESTINAL: No nausea, vomiting, diarrhea or abdominal pain.  GENITOURINARY: No dysuria, hematuria.  ENDOCRINE: No polyuria, nocturia,  HEMATOLOGY: No anemia, easy bruising or bleeding SKIN: No rash or lesion. MUSCULOSKELETAL: No joint pain or arthritis.   NEUROLOGIC: No tingling, numbness, weakness.  PSYCHIATRY: No anxiety or depression.   MEDICATIONS AT HOME:  Prior to Admission medications   Medication Sig Start Date End Date Taking? Authorizing Provider  amLODipine (NORVASC) 5 MG tablet Take 5 mg by mouth daily.    [provider]  aspirin EC 81 MG tablet Take 1 tablet (81 mg total) by mouth daily. 09/16/15   [name], MD  calcium-vitamin D (OSCAL WITH D) 250-125 MG-UNIT tablet Take 1 tablet by mouth daily.    [provider]  cholecalciferol (VITAMIN D) 1000 units tablet Take 1,000 Units by mouth daily.    [provider]  conjugated estrogens (PREMARIN) vaginal cream Place 1 Applicatorful vaginally daily. Apply 0.5mg  (pea-sized amount)  just inside the vaginal introitus with a finger-tip on  Monday, Wednesday and Friday nights. 08/09/18   Zara Council A, PA-C  docusate sodium (COLACE) 100 MG capsule Take 100 mg by mouth 2 (two) times daily.     [provider]  ELIQUIS 5 MG TABS tablet TAKE ONE TABLET BY MOUTH  TWICE A DAY 07/12/18   Lequita Asal, MD  estradiol (ESTRACE VAGINAL) 0.1 MG/GM vaginal cream Apply 0.5mg  (pea-sized amount)  just inside the vaginal introitus with a finger-tip on Monday, Wednesday and Friday nights. 08/09/18   Zara Council A, PA-C  folic acid (FOLVITE) 903 MCG tablet Take 400 mcg by mouth daily.    [provider]  furosemide (LASIX) 20 MG tablet Take 1 tablet (20 mg total) by mouth daily. Patient not taking: Reported on 08/09/2018 03/10/18   Darylene Price A, FNP  ipratropium-albuterol (DUONEB) 0.5-2.5 (3) MG/3ML SOLN Take 3 mLs by nebulization every 4 (four) hours as needed.    [provider]  JAKAFI 15 MG tablet TAKE 1 TABLET (15 MG TOTAL) BY MOUTH 2 TIMES DAILY. 07/07/18   Lequita Asal, MD  potassium chloride (K-DUR,KLOR-CON) 10 MEQ tablet Take 1 tablet (10 mEq total) by mouth daily. T Patient not taking: Reported on 08/09/2018 06/18/18   Mayo, Pete Pelt, MD  sulfamethoxazole-trimethoprim (  BACTRIM DS,SEPTRA DS) 800-160 MG tablet Take 1 tablet by mouth every 12 (twelve) hours. 08/10/18   McGowan, Larene Beach A, PA-C      PHYSICAL EXAMINATION:   VITAL SIGNS: Blood pressure (!) 117/55, pulse 83, temperature 98.8 F (37.1 C), temperature source Oral, resp. rate 20, height 5\' 7"  (1.702 m), weight 91.6 kg, SpO2 93 %.  GENERAL:  84 y.o.-year-old patient lying in the bed with no acute distress.  Morbidly obese EYES: Pupils equal, round, reactive to light and accommodation. No scleral icterus. Extraocular muscles intact.  HEENT: Head atraumatic, normocephalic. Oropharynx and nasopharynx clear.  NECK:  Supple, no jugular venous distention. No thyroid enlargement, no tenderness.  LUNGS: Diminished breath sounds bilaterally. No use of accessory muscles of respiration.  CARDIOVASCULAR: S1, S2 normal. No murmurs, rubs, or gallops.  ABDOMEN: Soft, nontender, nondistended. Bowel sounds present. No organomegaly or mass.  EXTREMITIES: No pedal edema, cyanosis, or  clubbing.  NEUROLOGIC: Cranial nerves II through XII are intact. Muscle strength 5/5 in all extremities. Sensation intact. Gait not checked.  PSYCHIATRIC: The patient is alert and oriented x 3.  SKIN: No obvious rash, lesion, or ulcer.   LABORATORY PANEL:   CBC Recent Labs  Lab 08/12/18 1636  WBC 11.3*  HGB 9.8*  HCT 31.3*  PLT 446*  MCV 82.6  MCH 25.7*  MCHC 31.2*  RDW 39.6*   ------------------------------------------------------------------------------------------------------------------  Chemistries  Recent Labs  Lab 08/12/18 1636  NA 141  K 4.1  CL 106  CO2 28  GLUCOSE 115*  BUN 11  CREATININE 0.55  CALCIUM 8.8*   ------------------------------------------------------------------------------------------------------------------ estimated creatinine clearance is 60.8 mL/min (by C-G formula based on SCr of 0.55 mg/dL). ------------------------------------------------------------------------------------------------------------------ No results for input(s): TSH, T4TOTAL, T3FREE, THYROIDAB in the last 72 hours.  Invalid input(s): FREET3   Coagulation profile No results for input(s): INR, PROTIME in the last 168 hours. ------------------------------------------------------------------------------------------------------------------- No results for input(s): DDIMER in the last 72 hours. -------------------------------------------------------------------------------------------------------------------  Cardiac Enzymes Recent Labs  Lab 08/12/18 1636  TROPONINI <0.03   ------------------------------------------------------------------------------------------------------------------ Invalid input(s): POCBNP  ---------------------------------------------------------------------------------------------------------------  Urinalysis    Component Value Date/Time   COLORURINE YELLOW (A) 06/15/2018 1239   APPEARANCEUR CLOUDY (A) 06/15/2018 1239   APPEARANCEUR Hazy  07/08/2014 0956   LABSPEC 1.012 06/15/2018 1239   LABSPEC 1.019 07/08/2014 0956   PHURINE 6.0 06/15/2018 1239   GLUCOSEU NEGATIVE 06/15/2018 1239   GLUCOSEU Negative 07/08/2014 0956   HGBUR SMALL (A) 06/15/2018 1239   BILIRUBINUR NEGATIVE 06/15/2018 1239   BILIRUBINUR Negative 07/08/2014 0956   KETONESUR NEGATIVE 06/15/2018 1239   PROTEINUR 30 (A) 06/15/2018 1239   NITRITE POSITIVE (A) 06/15/2018 1239   LEUKOCYTESUR LARGE (A) 06/15/2018 1239   LEUKOCYTESUR 1+ 07/08/2014 0956     RADIOLOGY: Dg Chest 2 View  Result Date: 08/12/2018 CLINICAL DATA:  Chest pain and shortness of breath EXAM: CHEST - 2 VIEW COMPARISON:  06/15/2018 FINDINGS: Stable cardiomegaly and basilar atelectasis. Right hemidiaphragm is chronically elevated. Central pulmonary arteries are enlarged compatible with pulmonary arterial hypertension when compared to prior CT. Upper lobes remain clear. No large effusion or pneumothorax. Trachea is midline. Atherosclerosis of aorta. Osteopenia and degenerative changes of the spine and shoulders. IMPRESSION: Stable cardiomegaly with basilar atelectasis and right hemidiaphragm elevation Enlarged central pulmonary arteries suggesting pulmonary arterial hypertension Atherosclerosis Electronically Signed   By: Jerilynn Mages.  Shick M.D.   On: 08/12/2018 17:12    EKG: Orders placed or performed during the hospital encounter of 08/12/18  . ED EKG  . ED EKG  .  EKG 12-Lead  . EKG 12-Lead    IMPRESSION AND PLAN: *Acute on COPD exacerbation Admit to our COPD protocol, regular nursing for bed, IV Solu-Medrol with tapering as tolerated, mucolytic agents, empiric doxycycline for 5-day course, aggressive pulmonary toilet with bronchodilator therapy, respiratory therapy to see, follow-up on cultures, wean O2 off as tolerated  *Chronic benign essential hypertension Stable Continue Norvasc  *History of cerebrovascular accident Stable Continue aspirin, check lipids in the morning, start statin  therapy  *History of pulmonary embolism/DVT Status post IVC filter placement Continue Eliquis  *Chronic polycythemia  Stable Continue Jakafi   All the records are reviewed and case discussed with ED provider. Management plans discussed with the patient, family and they are in agreement.  CODE STATUS:DNR Code Status History    Date Active Date Inactive Code Status Order ID Comments User Context   06/15/2018 1635 06/18/2018 2009 Full Code [id number]  [name], MD Inpatient   02/24/2018 0347 02/27/2018 0027 Full Code [id number]  Amelia Jo, MD Inpatient   10/15/2017 1141 10/16/2017 2204 Full Code [id number]  Knox Royalty, NP Inpatient   10/13/2017 2246 10/15/2017 1141 DNR [id number]  Lance Coon, MD ED   10/06/2017 1454 10/09/2017 2058 DNR [id number]  Nicholes Mango, MD Inpatient   10/06/2017 1111 10/06/2017 1453 Full Code [id number]  Nicholes Mango, MD Inpatient   05/27/2017 1954 05/28/2017 2013 Full Code [id number]  Vaughan Basta, MD Inpatient   05/23/2017 2322 05/26/2017 1614 Full Code [id number]  Lance Coon, MD Inpatient   04/10/2017 0325 04/11/2017 1916 Full Code [id number]  Harrie Foreman, MD Inpatient   11/23/2016 0629 12/01/2016 1935 Full Code [id number]  Harrie Foreman, MD Inpatient   08/26/2016 0445 08/26/2016 1923 Full Code [id number]  Saundra Shelling, MD Inpatient   09/22/2015 1755 09/25/2015 1844 Full Code [id number]  Demetrios Loll, MD ED   09/15/2015 0341 09/17/2015 1741 Full Code [id number]  Harrie Foreman, MD Inpatient   07/16/2015 0151 07/20/2015 2334 Full Code [id number]  Lance Coon, MD Inpatient   05/25/2015 0834 05/29/2015 1616 Full Code [id number]  Harrie Foreman, MD Inpatient   04/02/2015 0606 04/08/2015 1651 Full Code [id number]  Harrie Foreman, MD Inpatient       TOTAL TIME TAKING CARE OF THIS PATIENT: 35 minutes.    Avel Peace Zurri Rudden M.D on 08/12/2018   Between 7am to 6pm - Pager - [phone]   After 6pm go to [email or web address] - password [email or web address] [facility]  Office  [phone]  CC: Primary care physician; [name], MD   Note: This dictation was prepared with Dragon dictation along with smaller phrase technology. Any transcriptional errors that result from this process are unintentional.

## 2018-08-13 ENCOUNTER — Other Ambulatory Visit: Payer: Self-pay

## 2018-08-13 ENCOUNTER — Encounter: Payer: Self-pay | Admitting: *Deleted

## 2018-08-13 DIAGNOSIS — L8990 Pressure ulcer of unspecified site, unspecified stage: Secondary | ICD-10-CM

## 2018-08-13 LAB — LIPID PANEL
CHOL/HDL RATIO: 3.1 ratio
CHOLESTEROL: 104 mg/dL (ref 0–200)
HDL: 34 mg/dL — AB (ref >40)
LDL Cholesterol: 67 mg/dL (ref 0–99)
Triglycerides: 17 mg/dL (ref <150)
VLDL: 3 mg/dL (ref 0–40)

## 2018-08-13 MED ORDER — ALBUTEROL SULFATE (2.5 MG/3ML) 0.083% IN NEBU: 2.5000 mg | INHALATION_SOLUTION | RESPIRATORY_TRACT | Status: DC | PRN

## 2018-08-13 MED ORDER — METHYLPREDNISOLONE SODIUM SUCC 40 MG IJ SOLR
40.0000 mg | Freq: Every day | INTRAMUSCULAR | Status: DC
  Administered 2018-08-14 – 2018-08-15 (×2): 40 mg via INTRAVENOUS
  Filled 2018-08-13 (×2): qty 1

## 2018-08-13 MED ORDER — IPRATROPIUM-ALBUTEROL 0.5-2.5 (3) MG/3ML IN SOLN
3.0000 mL | Freq: Three times a day (TID) | RESPIRATORY_TRACT | Status: DC
  Administered 2018-08-13 – 2018-08-15 (×6): 3 mL via RESPIRATORY_TRACT
  Filled 2018-08-13 (×6): qty 3

## 2018-08-13 MED ORDER — OCUVITE-LUTEIN PO CAPS
1.0000 | ORAL_CAPSULE | Freq: Every day | ORAL | Status: DC
  Administered 2018-08-14 – 2018-08-15 (×2): 1 via ORAL
  Filled 2018-08-13 (×2): qty 1

## 2018-08-13 MED ORDER — DOXYCYCLINE HYCLATE 100 MG PO TABS
100.0000 mg | ORAL_TABLET | Freq: Two times a day (BID) | ORAL | Status: DC
  Administered 2018-08-13 – 2018-08-15 (×5): 100 mg via ORAL
  Filled 2018-08-13 (×6): qty 1

## 2018-08-13 MED ORDER — ENSURE ENLIVE PO LIQD
237.0000 mL | Freq: Three times a day (TID) | ORAL | Status: DC
  Administered 2018-08-13 – 2018-08-14 (×4): 237 mL via ORAL

## 2018-08-13 NOTE — Evaluation (Signed)
Occupational Therapy Evaluation Patient Details Name: Yolanda Wells MRN: [id number] DOB: 08-25-1932 Today's Date: 08/13/2018    History of Present Illness Pt is an 84 y.o. female presenting to hospital 08/12/18 with chest pain and SOB; admitted with acute on COPD exacerbation.  PMH includes h/o PE, CHF, htn, CVA, L LE DVT, colostomy, B THR, IVC filter, vision loss R eye, TMA R foot, chronic polycythemia.   Clinical Impression   Pt seen for OT evaluation this date. Pt is an 83 y/o female who presented with a COPD exacerbation with SOB and chest pain. Prior to admission, pt enjoyed playing cards and spending time with her granddaughter. Pt lives with her granddaughter, a Presenter, broadcasting,  in a one level apartment with ramp. Prior to admission, pt received some assist from granddaughter and  PCAs for all ADL and IADL tasks. Currently, pt demonstrates impaired strength, ROM, decreased activity tolerance requiring additional assist to maximize safety and independence. Pt will benefit from skilled OT services to address noted impairments and functional deficits in order to maximize safety, independence, and minimize caregiver burden and falls risk. OT recommends STR upon discharge.     Follow Up Recommendations  SNF    Equipment Recommendations  Other (comment)(TBD)    Recommendations for Other Services       Precautions / Restrictions Precautions Precautions: Fall Precaution Comments: Aspiration; colostomy LLQ; elevate HOB Restrictions Weight Bearing Restrictions: No      Mobility Bed Mobility     General bed mobility comments: Pt presented in recliner where she wanted to stay. No bed mobility noted.  Transfers         General transfer comment: Pt presented in recliner where she wanted to stay.     Balance Overall balance assessment: Mild deficits observed, not formally tested Sitting-balance support: No upper extremity supported Sitting balance-Leahy Scale: Good                                  ADL either performed or assessed with clinical judgement   ADL Overall ADL's : Needs assistance/impaired Eating/Feeding: Independent   Grooming: Sitting;Maximal assistance                     Toilet Transfer Details (indicate cue type and reason): Pt has colostomy bag. She also wears a pull up that is changed intermittently when her granddaughter is home.          Functional mobility during ADLs: Moderate assistance;Rolling walker General ADL Comments: Pt experiences fatigue when completing ADL tasks. Currently requires MOD/MAX assist for bathing, dressing, and toileting.      Vision Patient Visual Report: Other (comment) Additional Comments: Pt has vision loss in R eye.      Perception     Praxis      Pertinent Vitals/Pain Pain Assessment: No/denies pain     Hand Dominance     Extremity/Trunk Assessment Upper Extremity Assessment Upper Extremity Assessment: Generalized weakness   Lower Extremity Assessment Lower Extremity Assessment: Generalized weakness;Defer to PT evaluation    Communication Communication Communication: HOH   Cognition Arousal/Alertness: Awake/alert Behavior During Therapy: WFL for tasks assessed/performed Overall Cognitive Status: Within Functional Limits for tasks assessed                                     General Comments  R TMA noted  Exercises     Shoulder Instructions      Home Living Family/patient expects to be discharged to:: Private residence Living Arrangements: Other relatives(granddtr) Available Help at Discharge: Family;Personal care attendant;Available PRN/intermittently Type of Home: House Home Access: Ramped entrance     Home Layout: One level     Bathroom Shower/Tub: (pt receives help for sponge bath, doesn't use tub)   Bathroom Toilet: (pt uses briefs for urinating, ostomy bag, doesn't access toilet)     Home Equipment: Walker - 2  wheels;Wheelchair - manual;Bedside commode;Walker - 4 wheels;Wheelchair - power   Additional Comments: Granddaughter is a Presenter, broadcasting      Prior Functioning/Environment Level of Independence: Needs assistance  Gait / Transfers Assistance Needed: Pt uses a manual WC  ADL's / Homemaking Assistance Needed: Caregiver assists with a sponge bath, dressing (UE and LE).  Granddaughter does the drving, cooking, cleaning and grooming.  Per prior PT note 06/16/18, Pt independent with feeding.    Comments: Transfers to manual w/c and different seating surfaces.  Can stand at kitchen sink for 2-3 minutes.  Not quite back to baseline after last STR stay.  Receiving HHPT.        OT Problem List: Decreased strength;Decreased range of motion;Decreased activity tolerance;Impaired UE functional use;Impaired vision/perception;Decreased knowledge of use of DME or AE      OT Treatment/Interventions: Self-care/ADL training;DME and/or AE instruction;Energy conservation;Therapeutic exercise;Therapeutic activities;Patient/family education    OT Goals(Current goals can be found in the care plan section) Acute Rehab OT Goals Patient Stated Goal: to go home and be with her granddaughter OT Goal Formulation: With patient Time For Goal Achievement: 08/27/18 Potential to Achieve Goals: Good  OT Frequency: Min 2X/week   Barriers to D/C:            Co-evaluation              AM-PAC PT "6 Clicks" Daily Activity     Outcome Measure Help from another person eating meals?: None Help from another person taking care of personal grooming?: A Lot Help from another person toileting, which includes using toliet, bedpan, or urinal?: A Lot Help from another person bathing (including washing, rinsing, drying)?: A Lot Help from another person to put on and taking off regular upper body clothing?: A Lot Help from another person to put on and taking off regular lower body clothing?: A Lot 6 Click Score: 14   End of  Session    Activity Tolerance: Patient tolerated treatment well Patient left: in chair;with call bell/phone within reach;with chair alarm set  OT Visit Diagnosis: Other abnormalities of gait and mobility (R26.89);Muscle weakness (generalized) (M62.81)                Time: 1660-6301 OT Time Calculation (min): 31 min Charges:     Jadene Pierini OTS  08/13/2018, 4:08 PM

## 2018-08-13 NOTE — Progress Notes (Signed)
Initial Nutrition Assessment  DOCUMENTATION CODES:   Obesity unspecified  INTERVENTION:  Will downgrade diet to dysphagia 3 (mechanical soft) with thin liquids.  Provide Ensure Enlive po TID, each supplement provides 350 kcal and 20 grams of protein. Patient prefers strawberry or chocolate.  Provide Ocuvite daily for wound healing (provides zinc, vitamin A, vitamin C, Vitamin E, copper, and selenium).  Recommend supervision at meals.  NUTRITION DIAGNOSIS:   Increased nutrient needs related to wound healing, catabolic illness(COPD) as evidenced by estimated needs.  GOAL:   Patient will meet greater than or equal to 90% of their needs  MONITOR:   PO intake, Supplement acceptance, Labs, Weight trends, Skin, I & O's  REASON FOR ASSESSMENT:   Consult Assessment of nutrition requirement/status  ASSESSMENT:   84 year old female with PMHx of HTN, hx DVT to right lower extremity, polycythemia vera, OA, hx CVA, diverticulosis, CHF, COPD, hx colectomy with colostomy creation 04/03/2015 who is admitted with acute exacerbation of COPD.   Met with patient at bedside. She seemed very confused so unsure how good of a historian she is. There were no family members present at time of RD assessment. Patient reports she lives with [name] (per chart she has a granddaughter by this name) who prepares her meals for her. Patient reports she has not been eating much at meals lately. She reports eating a sandwich last night brought in by [facility] but cannot recall what was on the sandwich. This morning for breakfast she only had bites of toast and banana and did not want any protein. She reports that she enjoys Ensure and Boost and wants to drink some here. During RD assessment patient was taking large bites of banana and trying to eat and drink very quickly. RD had to encourage smaller bites. Patient may benefit from super vision at meals.  Patient is unsure of her UBW or weight history. Weights fluctuate  significantly in chart, so unsure how accurate they are. In the past year weights have ranged from 81.6-98.4 kg. Patient is currently 91.6 kg (201.94 lbs).  Medications reviewed and include: Oscal with D (500 mg calcium, 200 units vitamin D) 1 tablet daily, vitamin D 1000 units daily, Colace, folic acid 0.5 mg daily, Solu-Medrol 40 mg daily IV, Jakafi.  Labs reviewed.  Patient is at risk for malnutrition but does not meet criteria for malnutrition at this time.  Discussed with RN.  NUTRITION - FOCUSED PHYSICAL EXAM:    Most Recent Value  Orbital Region  No depletion  Upper Arm Region  No depletion  Thoracic and Lumbar Region  No depletion  Buccal Region  No depletion  Temple Region  Mild depletion  Clavicle Bone Region  Mild depletion  Clavicle and Acromion Bone Region  No depletion  Scapular Bone Region  No depletion  Dorsal Hand  Mild depletion  Patellar Region  Mild depletion  Anterior Thigh Region  Mild depletion  Posterior Calf Region  Moderate depletion  Edema (RD Assessment)  Moderate  Hair  Reviewed  Eyes  Reviewed  Mouth  Reviewed  Skin  Reviewed  Nails  Reviewed     Diet Order:   Diet Order            Diet Heart Room service appropriate? Yes; Fluid consistency: Thin  Diet effective now              EDUCATION NEEDS:   Not appropriate for education at this time  Skin:  Skin Assessment: Skin Integrity Issues: Skin  Integrity Issues:: Unstageable Unstageable: left buttocks (1cm x 1cm); coccyx (0.5cm x 3cm)   Last BM:  Unknown - colostomy in place  Height:   Ht Readings from Last 1 Encounters:  08/12/18 '5\' 7"'$  (1.702 m)    Weight:   Wt Readings from Last 1 Encounters:  08/12/18 91.6 kg    Ideal Body Weight:  61.4 kg  BMI:  Body mass index is 31.63 kg/m.  Estimated Nutritional Needs:   Kcal:  5993-5701 (MSJ x 1.2-1.4)  Protein:  85-100 grams (0.9-1.1 grams/kg)  Fluid:  1.5-1.8 L/day (25-30 mL/kg IBW)  Willey Blade, MS, RD,  LDN Office: [phone] Pager: [phone] After Hours/Weekend Pager: [phone]

## 2018-08-13 NOTE — Progress Notes (Signed)
Patient admitted with COPD and O2 sats ranging in the 80's.  MD assessment this morning, patient O2 sats at 95 - 100 on room air.  Physical therapy also worked with patient and O2 sats were 95.  Per MD okay to leave patient on room air for O2 sats 90 and above.

## 2018-08-13 NOTE — Clinical Social Work Note (Signed)
Clinical Social Work Assessment  Patient Details  Name: Yolanda Wells MRN: [id number] Date of Birth: [birth year]  Date of referral:  08/13/18               Reason for consult:  Facility Placement                Permission sought to share information with:    Permission granted to share information::     Name::        Agency::     Relationship::     Contact Information:     Housing/Transportation Living arrangements for the past 2 months:  Single Family Home Source of Information:  Patient, Other (Comment Required)(granddaughter ) Patient Interpreter Needed:  None Criminal Activity/Legal Involvement Pertinent to Current Situation/Hospitalization:  No - Comment as needed Significant Relationships:  Adult Children, Other Family Members Lives with:  Other (Comment)(granddaughter ) Do you feel safe going back to the place where you live?  Yes Need for family participation in patient care:  Yes (Comment)  Care giving concerns:  Patient lives in [place] with her granddaughter [name] [phone].    Social Worker assessment / plan:  Holiday representative (CSW) reviewed chart and noted that PT is recommending SNF. CSW met with patient alone at bedside to discuss D/C plan. Patient was alert and oriented X3 and was laying in the bed. CSW introduced self and explained role of CSW department. Per patient she lives in [place] with her granddaughter [name]. CSW explained SNF process. Patient reported that she does not want to go to SNF and wants to go home. CSW contacted patient's granddaughter [name] and made her aware of above. Per [name] patient just got out of rehab at [facility] 16 days ago. Per [name] patient walks 5-10 feet at baseline and usually has her shoes on. [name] reported that she wants patient to return home with [facility] home health. RN case manager aware of above. CSW will continue to follow and assist as needed.   Employment status:  Retired Forensic scientist:   Medicare PT Recommendations:  [place] / Referral to community resources:  Other (Comment Required)(Patient and her granddaughter prefer to D/C home. )  Patient/Family's Response to care:  Patient refused SNF and will D/C home.   Patient/Family's Understanding of and Emotional Response to Diagnosis, Current Treatment, and Prognosis:  Patient and her granddaughter were very pleasant and thanked CSW for assistance.   Emotional Assessment Appearance:  Appears stated age Attitude/Demeanor/Rapport:    Affect (typically observed):  Pleasant Orientation:  Oriented to Self, Oriented to Place, Oriented to  Time, Oriented to Situation Alcohol / Substance use:  Not Applicable Psych involvement (Current and /or in the community):  No (Comment)  Discharge Needs  Concerns to be addressed:  Discharge Planning Concerns Readmission within the last 30 days:  No Current discharge risk:  Dependent with Mobility Barriers to Discharge:  Continued Medical Work up   [employer], Veronia Beets, LCSW 08/13/2018, 4:58 PM

## 2018-08-13 NOTE — Progress Notes (Signed)
MD paged to have DNR removed from patient's chart. Patient's granddaughter in room and removed her DNR bracelet.

## 2018-08-13 NOTE — Plan of Care (Signed)
°  Problem: Respiratory: °Goal: Ability to maintain a clear airway will improve °Outcome: Progressing °  °

## 2018-08-13 NOTE — Progress Notes (Signed)
Patient ID: Yolanda Wells, female   DOB: 1932/07/12, 84 y.o.   MRN: [id number]  [employer] PROGRESS NOTE  Rhodie Cienfuegos GGY:[id number] DOB: [birth year] DOA: 08/12/2018 PCP: [name], MD  HPI/Subjective: Patient feels okay.  No complaints of shortness of breath or chest pain.  Was admitted yesterday for low pulse oximetry.  As per granddaughter, the pulse ox was in the mid 80s especially with moving around.  Objective: Vitals:   08/13/18 0900 08/13/18 1354  BP:  138/74  Pulse:  83  Resp:  18  Temp:  97.9 F (36.6 C)  SpO2: 90% 100%    Filed Weights   08/12/18 1631  Weight: 91.6 kg    ROS: Review of Systems  Constitutional: Negative for chills and fever.  Eyes: Negative for blurred vision.  Respiratory: Negative for cough and shortness of breath.   Cardiovascular: Negative for chest pain.  Gastrointestinal: Negative for abdominal pain, constipation, diarrhea, nausea and vomiting.  Genitourinary: Negative for dysuria.  Musculoskeletal: Negative for joint pain.  Neurological: Negative for dizziness and headaches.   Exam: Physical Exam  HENT:  Nose: No mucosal edema.  Mouth/Throat: No oropharyngeal exudate or posterior oropharyngeal edema.  Eyes: Pupils are equal, round, and reactive to light. Conjunctivae, EOM and lids are normal.  Neck: No JVD present. Carotid bruit is not present. No edema present. No thyroid mass and no thyromegaly present.  Cardiovascular: S1 normal and S2 normal. Exam reveals no gallop.  No murmur heard. Pulses:      Dorsalis pedis pulses are 2+ on the right side, and 2+ on the left side.  Respiratory: No respiratory distress. She has decreased breath sounds in the right lower field and the left lower field. She has no wheezes. She has no rhonchi. She has no rales.  GI: Soft. Bowel sounds are normal. There is no tenderness.  Musculoskeletal:       Right ankle: She exhibits no swelling.       Left ankle: She exhibits no swelling.  Status  post prior amputation right toes, looks like dropfoot on left foot  Lymphadenopathy:    She has no cervical adenopathy.  Neurological: She is alert. No cranial nerve deficit.  Skin: Skin is warm. No rash noted. Nails show no clubbing.  Psychiatric: She has a normal mood and affect.      Data Reviewed: Basic Metabolic Panel: Recent Labs  Lab 08/12/18 1636  NA 141  K 4.1  CL 106  CO2 28  GLUCOSE 115*  BUN 11  CREATININE 0.55  CALCIUM 8.8*   CBC: Recent Labs  Lab 08/12/18 1636  WBC 11.3*  HGB 9.8*  HCT 31.3*  MCV 82.6  PLT 446*   Cardiac Enzymes: Recent Labs  Lab 08/12/18 1636  TROPONINI <0.03   BNP (last 3 results) Recent Labs    04/06/18 1616 05/17/18 2036 06/21/18 0500  BNP 39.0 74.0 30.0      Recent Results (from the past 240 hour(s))  Culture, blood (routine x 2) Call MD if unable to obtain prior to antibiotics being given     Status: None (Preliminary result)   Collection Time: 08/12/18 11:15 PM  Result Value Ref Range Status   Specimen Description BLOOD BLOOD LEFT HAND  Final   Special Requests   Final    BOTTLES DRAWN AEROBIC AND ANAEROBIC Blood Culture adequate volume   Culture   Final    NO GROWTH < 12 HOURS Performed at [facility], [address] [place], [place]  [address]    Report Status PENDING  Incomplete  Culture, blood (routine x 2) Call MD if unable to obtain prior to antibiotics being given     Status: None (Preliminary result)   Collection Time: 08/12/18 11:25 PM  Result Value Ref Range Status   Specimen Description BLOOD BLOOD RIGHT HAND  Final   Special Requests   Final    BOTTLES DRAWN AEROBIC AND ANAEROBIC Blood Culture adequate volume   Culture   Final    NO GROWTH < 12 HOURS Performed at [facility], [address] [place], [place] [address]    Report Status PENDING  Incomplete     Studies: Dg Chest 2 View  Result Date: 08/12/2018 CLINICAL DATA:  Chest pain and shortness of breath EXAM:  CHEST - 2 VIEW COMPARISON:  06/15/2018 FINDINGS: Stable cardiomegaly and basilar atelectasis. Right hemidiaphragm is chronically elevated. Central pulmonary arteries are enlarged compatible with pulmonary arterial hypertension when compared to prior CT. Upper lobes remain clear. No large effusion or pneumothorax. Trachea is midline. Atherosclerosis of aorta. Osteopenia and degenerative changes of the spine and shoulders. IMPRESSION: Stable cardiomegaly with basilar atelectasis and right hemidiaphragm elevation Enlarged central pulmonary arteries suggesting pulmonary arterial hypertension Atherosclerosis Electronically Signed   By: Jerilynn Mages.  Shick M.D.   On: 08/12/2018 17:12    Scheduled Meds: . amLODipine  5 mg Oral Daily  . apixaban  5 mg Oral BID  . aspirin EC  81 mg Oral Daily  . atorvastatin  20 mg Oral q1800  . calcium-vitamin D  1 tablet Oral Daily  . cholecalciferol  1,000 Units Oral Daily  . docusate sodium  100 mg Oral BID  . doxycycline  100 mg Oral BID WC  . feeding supplement (ENSURE ENLIVE)  237 mL Oral TID BM  . folic acid  0.5 mg Oral Daily  . guaiFENesin  600 mg Oral BID  . ipratropium-albuterol  3 mL Nebulization TID  . [START ON 08/14/2018] methylPREDNISolone (SOLU-MEDROL) injection  40 mg Intravenous Daily  . mometasone-formoterol  1 puff Inhalation BID  . [START ON 08/14/2018] multivitamin-lutein  1 capsule Oral Daily  . ruxolitinib phosphate  15 mg Oral BID   Continuous Infusions:  Assessment/Plan:  1. Acute hypoxic respiratory failure.  Patient required oxygen yesterday.  Currently off oxygen at rest.  Check a pulse ox with ambulation. 2. Asthmatic bronchitis.  Decrease dose of Solu-Medrol to daily dosing.  Continue nebulizer treatments. 3. Essential hypertension on Norvasc 4. History of pulmonary embolism with IVC filter placement in the past.  On Eliquis 5. History of polycythemia vera.  Platelet count acceptable hemoglobin stable at 9.8 6. History of stroke on aspirin  and statin 7. Weakness.  Physical therapy evaluation  Code Status:     Code Status Orders  (From admission, onward)         Start     Ordered   08/13/18 1039  Full code  Continuous     08/13/18 1038        Code Status History    Date Active Date Inactive Code Status Order ID Comments User Context   08/12/2018 2001 08/13/2018 1038 DNR [id number]  Gorden Harms, MD ED   08/12/2018 2001 08/12/2018 2001 Full Code [id number]  Salary, Avel Peace, MD ED   06/15/2018 1635 06/18/2018 2009 Full Code [id number]  [name], MD Inpatient   02/24/2018 0347 02/27/2018 0027 Full Code [id number]  Amelia Jo, MD Inpatient   10/15/2017 1141 10/16/2017 2204 Full Code [id number]  Knox Royalty, NP Inpatient   10/13/2017 2246 10/15/2017 1141 DNR [id number]  Lance Coon, MD ED   10/06/2017 1454 10/09/2017 2058 DNR [id number]  Nicholes Mango, MD Inpatient   10/06/2017 1111 10/06/2017 1453 Full Code [id number]  Nicholes Mango, MD Inpatient   05/27/2017 1954 05/28/2017 2013 Full Code [id number]  Vaughan Basta, MD Inpatient   05/23/2017 2322 05/26/2017 1614 Full Code [id number]  Lance Coon, MD Inpatient   04/10/2017 0325 04/11/2017 1916 Full Code [id number]  Harrie Foreman, MD Inpatient   11/23/2016 0629 12/01/2016 1935 Full Code [id number]  Harrie Foreman, MD Inpatient   08/26/2016 0445 08/26/2016 1923 Full Code [id number]  Saundra Shelling, MD Inpatient   09/22/2015 1755 09/25/2015 1844 Full Code [id number]  Demetrios Loll, MD ED   09/15/2015 0341 09/17/2015 1741 Full Code [id number]  Harrie Foreman, MD Inpatient   07/16/2015 0151 07/20/2015 2334 Full Code [id number]  Lance Coon, MD Inpatient   05/25/2015 0834 05/29/2015 1616 Full Code [id number]  Harrie Foreman, MD Inpatient   04/02/2015 0606 04/08/2015 1651 Full Code [id number]  Harrie Foreman, MD Inpatient     Family Communication: Spoke with granddaughter on the phone and she is very nervous about the patient's oxygenation. Disposition Plan: Hopefully can  go home tomorrow.  Time spent: 28 minutes  [place]

## 2018-08-13 NOTE — NC FL2 (Signed)
[place] LEVEL OF CARE SCREENING TOOL     IDENTIFICATION  Patient Name: Jaquelin Meaney Birthdate: [birth year] Sex: female Admission Date (Current Location): 08/12/2018  [place] and [place] Number:  Engineering geologist and Address:  [facility], [address], [place], [place] [address]      Provider Number: [id number]  Attending Physician Name and Address:  Loletha Grayer, MD  Relative Name and Phone Number:       Current Level of Care: Hospital Recommended Level of Care: [place] Prior Approval Number:    Date Approved/Denied:   PASRR Number: [id number])  Discharge Plan: SNF    Current Diagnoses: Patient Active Problem List   Diagnosis Date Noted  . Pressure injury of skin 08/13/2018  . B12 deficiency 08/04/2018  . Anemia 06/07/2018  . Secondary myelofibrosis ([place]) 02/17/2018  . Goals of care, counseling/discussion 12/23/2017  . Palliative care by specialist   . DNR (do not resuscitate) discussion   . Weakness generalized   . Chronic diastolic CHF (congestive heart failure) ([place]) 07/28/2017  . Vascular dementia without behavioral disturbance ([place]) 07/08/2017  . Influenza with respiratory manifestation 12/01/2016  . Respiratory distress 11/23/2016  . Urinary incontinence in female 09/02/2016  . Vision loss of right eye 08/26/2016  . Blindness of right eye 08/26/2016  . Inability to ambulate due to hip 10/12/2015  . Colostomy in place [facility]) 10/04/2015  . Long term current use of anticoagulant therapy 10/03/2015  . Leukocytosis 09/25/2015  . CAP (community acquired pneumonia) 09/24/2015  . COPD (chronic obstructive pulmonary disease) ([place]) 09/24/2015  . UTI (urinary tract infection) 07/16/2015  . Abdominal wall cellulitis 07/15/2015  . DVT (deep venous thrombosis) ([place]) 07/15/2015  . HTN (hypertension) 07/15/2015  . Polycythemia vera ([place]) 07/15/2015  . Arthritis 07/15/2015  . Pulmonary emboli ([place])  05/25/2015  . Diverticulosis of colon with hemorrhage 04/02/2015  . Diverticulosis of intestine with bleeding 04/02/2015  . Fall 01/10/2015  . Osteoarthritis 01/10/2015  . Hammertoe 09/02/2012  . S/P transmetatarsal amputation of foot ([place]) 09/02/2012  . Onychomycosis 09/02/2012  . Other specified dermatoses 09/02/2012  . S/P hip replacement 08/23/2012  . Glenohumeral arthritis 06/28/2012    Orientation RESPIRATION BLADDER Height & Weight     Self, Place, Time, Situation  Normal Incontinent Weight: 201 lb 15.1 oz (91.6 kg) Height:  5\' 7"  (170.2 cm)  BEHAVIORAL SYMPTOMS/MOOD NEUROLOGICAL BOWEL NUTRITION STATUS      Continent Diet(Diet: DYS 3 )  AMBULATORY STATUS COMMUNICATION OF NEEDS Skin   Extensive Assist Verbally PU Stage and Appropriate Care(unstagable pressure ulcer on coccyx and buttocks.  )                       Personal Care Assistance Level of Assistance  Bathing, Feeding, Dressing Bathing Assistance: Limited assistance Feeding assistance: Independent Dressing Assistance: Limited assistance     Functional Limitations Info  Sight, Hearing, Speech Sight Info: Adequate Hearing Info: Impaired Speech Info: Adequate    SPECIAL CARE FACTORS FREQUENCY  PT (By licensed PT), OT (By licensed OT)     PT Frequency: (5) OT Frequency: (5)            Contractures      Additional Factors Info  Code Status, Allergies Code Status Info: (Full Code. ) Allergies Info: (No Known Allergies. )           Current Medications (08/13/2018):  This is the current hospital active medication list Current Facility-Administered Medications  Medication Dose Route Frequency Provider Last Rate Last Dose  . albuterol (PROVENTIL) (2.5 MG/3ML) 0.083% nebulizer solution 2.5 mg  2.5 mg Nebulization Q4H PRN Wieting, Richard, MD      . amLODipine (NORVASC) tablet 5 mg  5 mg Oral Daily Salary, Montell D, MD   5 mg at 08/13/18 1134  . apixaban (ELIQUIS) tablet 5 mg  5 mg Oral BID  Salary, Montell D, MD   5 mg at 08/13/18 1134  . aspirin EC tablet 81 mg  81 mg Oral Daily Salary, Montell D, MD   81 mg at 08/13/18 1134  . atorvastatin (LIPITOR) tablet 20 mg  20 mg Oral q1800 Salary, Montell D, MD      . calcium-vitamin D (OSCAL WITH D) 500-200 MG-UNIT per tablet 1 tablet  1 tablet Oral Daily Salary, Montell D, MD   1 tablet at 08/13/18 1134  . cholecalciferol (VITAMIN D) tablet 1,000 Units  1,000 Units Oral Daily Salary, Montell D, MD   1,000 Units at 08/13/18 1136  . docusate sodium (COLACE) capsule 100 mg  100 mg Oral BID Loney Hering D, MD   100 mg at 08/12/18 2320  . doxycycline (VIBRA-TABS) tablet 100 mg  100 mg Oral BID WC Loletha Grayer, MD   100 mg at 08/13/18 1135  . feeding supplement (ENSURE ENLIVE) (ENSURE ENLIVE) liquid 237 mL  237 mL Oral TID BM Loletha Grayer, MD   237 mL at 08/13/18 1457  . folic acid (FOLVITE) tablet 0.5 mg  0.5 mg Oral Daily Salary, Montell D, MD   0.5 mg at 08/13/18 1134  . guaiFENesin (MUCINEX) 12 hr tablet 600 mg  600 mg Oral BID Salary, Montell D, MD   600 mg at 08/13/18 1134  . ipratropium-albuterol (DUONEB) 0.5-2.5 (3) MG/3ML nebulizer solution 3 mL  3 mL Nebulization TID Loletha Grayer, MD   3 mL at 08/13/18 1413  . [START ON 08/14/2018] methylPREDNISolone sodium succinate (SOLU-MEDROL) 40 mg/mL injection 40 mg  40 mg Intravenous Daily Wieting, Richard, MD      . mometasone-formoterol (DULERA) 200-5 MCG/ACT inhaler 1 puff  1 puff Inhalation BID Salary, Montell D, MD   1 puff at 08/13/18 1138  . [START ON 08/14/2018] multivitamin-lutein (OCUVITE-LUTEIN) capsule 1 capsule  1 capsule Oral Daily Wieting, Richard, MD      . ruxolitinib phosphate (JAKAFI) tablet 15 mg  15 mg Oral BID Salary, Avel Peace, MD         Discharge Medications: Please see discharge summary for a list of discharge medications.  Relevant Imaging Results:  Relevant Lab Results:   Additional Information (SSN: [id number])  [place] Shevlin, Veronia Beets, LCSW

## 2018-08-13 NOTE — Care Management Note (Signed)
Case Management Note  Patient Details  Name: Yolanda Wells MRN: [id number] Date of Birth: 25-Oct-1932  Subjective/Objective:            Patient admitted from home for COPD.  CM assessment completed with patient and with patient's caregiver [name] over the phone.  Patient lives with [name] who is the granddaughter.  Granddaughter's number is [phone]. Verified PCP is [name], last seen on Friday 08/06/18 for f/u from previous admissions.  [name].   At home patient has Home health services with [facility], PT and OT.  Patient also gets aide services to help bathing and other ADL's.  Granddaughter pays out of pocket for aide services and the services are provided by a private party not associated with a home health company her name is [name].  Granddaughter provides and or arranges transportation.  In the home the patient has available a wheelchair, a motorized chair, 2 walkers, one with 4 wheels and one with 2 wheels, and a shower chair.  PT and OT have evaluated patient and recommend SNF.  RNCM will continue to follow.            Action/Plan:   Expected Discharge Date:  08/14/18               Expected Discharge Plan:  [place]  In-House Referral:     Discharge planning Services  CM Consult  Post Acute Care Choice:    Choice offered to:     DME Arranged:    DME Agency:     HH Arranged:    HH Agency:     Status of Service:  In process, will continue to follow  If discussed at Long Length of Stay Meetings, dates discussed:    Additional Comments:  Shelbie Hutching, RN 08/13/2018, 2:57 PM

## 2018-08-13 NOTE — Plan of Care (Signed)
  Problem: Education: Goal: Knowledge of disease or condition will improve Outcome: Progressing Goal: Knowledge of the prescribed therapeutic regimen will improve Outcome: Progressing Goal: Individualized Educational Video(s) Outcome: Progressing   Problem: Activity: Goal: Ability to tolerate increased activity will improve Outcome: Progressing Goal: Will verbalize the importance of balancing activity with adequate rest periods Outcome: Progressing   Problem: Respiratory: Goal: Ability to maintain a clear airway will improve Outcome: Progressing   Problem: Education: Goal: Knowledge of General Education information will improve Description Including pain rating scale, medication(s)/side effects and non-pharmacologic comfort measures Outcome: Progressing   Problem: Health Behavior/Discharge Planning: Goal: Ability to manage health-related needs will improve Outcome: Progressing   Problem: Clinical Measurements: Goal: Ability to maintain clinical measurements within normal limits will improve Outcome: Progressing Goal: Will remain free from infection Outcome: Progressing Goal: Diagnostic test results will improve Outcome: Progressing Goal: Respiratory complications will improve Outcome: Progressing Goal: Cardiovascular complication will be avoided Outcome: Progressing   Problem: Activity: Goal: Risk for activity intolerance will decrease Outcome: Progressing   Problem: Elimination: Goal: Will not experience complications related to urinary retention Outcome: Progressing   Problem: Skin Integrity: Goal: Risk for impaired skin integrity will decrease Outcome: Progressing

## 2018-08-13 NOTE — Progress Notes (Signed)
Yolanda Wells brought in by granddaughter, [name] and given to patient at this time.

## 2018-08-13 NOTE — Evaluation (Signed)
Physical Therapy Evaluation Patient Details Name: Yolanda Wells MRN: [id number] DOB: [birth year] Today's Date: 08/13/2018   History of Present Illness  Pt is an 84 y.o. female presenting to hospital 08/12/18 with chest pain and SOB; admitted with acute on COPD exacerbation.  PMH includes h/o PE, CHF, htn, CVA, L LE DVT, colostomy, B THR, IVC filter, vision loss R eye, TMA R foot, chronic polycythemia.  Clinical Impression  Prior to hospital admission, pt was requiring minimal assist with bed mobility, transfers with RW, and walking 4-6 feet with RW with HHPT.  Pt lives with her granddaughter in 1 level apt.  Currently pt is mod assist semi-supine to sit; max assist with transfers; and only able to walk a few feet with RW (bed to chair) 1x with mod assist d/t weakness and fatigue.  O2 sats 95% or greater on room air during session's activities (although SOB noted).  HR 74 bpm at rest but increased up to 124 bpm with activity (decreased back down to 84 bpm with sitting rest break).  Pt did not have her shoes here (with orthotic) which may improve pt's mobility and decrease assist levels (discussed with pt and pt's granddaughter); pt reports she does not always use shoes at home and wanted to try walking without them with therapist today (pt's granddaughter confirmed this after the session but reports pt is usually on carpet).  Pt would benefit from skilled PT to address noted impairments and functional limitations (see below for any additional details).  Upon hospital discharge, recommend pt discharge to [place].    Follow Up Recommendations SNF    Equipment Recommendations  Rolling walker with 5" wheels;3in1 (PT);Wheelchair (measurements PT);Wheelchair cushion (measurements PT)    Recommendations for Other Services OT consult     Precautions / Restrictions Precautions Precautions: Fall Precaution Comments: Aspiration; colostomy LLQ; elevate HOB Restrictions Weight Bearing Restrictions: No       Mobility  Bed Mobility Overal bed mobility: Needs Assistance Bed Mobility: Supine to Sit     Supine to sit: Mod assist;HOB elevated     General bed mobility comments: assist for trunk and L LE; vc's for use of bed rail  Transfers Overall transfer level: Needs assistance Equipment used: Rolling walker (2 wheeled) Transfers: Sit to/from Stand Sit to Stand: Max assist         General transfer comment: max assist to stand from elevated bed and x3 trials from recliner; vc's and assist for LE and UE positioning; assist to block L foot from sliding forward  Ambulation/Gait Ambulation/Gait assistance: Mod assist Gait Distance (Feet): 3 Feet(bed to recliner) Assistive device: Rolling walker (2 wheeled)   Gait velocity: decreased   General Gait Details: unsteady; vc's for safety; attempted 2nd trial ambulating but pt unable to take any steps d/t being too fatigued  Stairs            Wheelchair Mobility    Modified Rankin (Stroke Patients Only)       Balance Overall balance assessment: Needs assistance Sitting-balance support: No upper extremity supported;Feet supported Sitting balance-Leahy Scale: Good Sitting balance - Comments: steady sitting reaching within BOS   Standing balance support: Bilateral upper extremity supported Standing balance-Leahy Scale: Poor Standing balance comment: requires B UE support on RW; pt pushing B LE's against bed or chair to maintain standing                             Pertinent Vitals/Pain Pain Assessment:  No/denies pain    Home Living Family/patient expects to be discharged to:: Private residence Living Arrangements: Other relatives(Pt's granddaughter) Available Help at Discharge: Family;Personal care attendant;Available PRN/intermittently(per last hospital PT note 06/16/18, pt with PCA daily 9-12 and 5-7 pm) Type of Home: House Home Access: Ramped entrance     Home Layout: One level Home Equipment: Walker - 2  wheels;Wheelchair - manual Additional Comments: Granddaughter is a Comptroller Level of Independence: Needs assistance   Gait / Transfers Assistance Needed: Minimal assist with bed mobility, transfers with RW, and ambulating 4-6 feet with RW with HHPT  ADL's / Homemaking Assistance Needed: Per prior PT note 06/16/18 "Caregiver assists with a sponge bath, dressing, brushing her hair.  Granddaughter does the drving, cooking, cleaning.  Pt independent with feeding.  "  Comments: Transfers to manual w/c and different seating surfaces.  Can stand at kitchen sink for 2-3 minutes.  Not quite back to baseline after last STR stay.  Receiving HHPT.     Hand Dominance        Extremity/Trunk Assessment   Upper Extremity Assessment Upper Extremity Assessment: Generalized weakness    Lower Extremity Assessment Lower Extremity Assessment: RLE deficits/detail;LLE deficits/detail RLE Deficits / Details: R TMA; 3/5 hip flexion, knee flexion/extension LLE Deficits / Details: hip flexion 2+/5; knee flexion/extension 3-/5; DF 2/5; limited L knee flexion ROM    Cervical / Trunk Assessment Cervical / Trunk Assessment: Kyphotic  Communication   Communication: No difficulties  Cognition Arousal/Alertness: Awake/alert Behavior During Therapy: WFL for tasks assessed/performed Overall Cognitive Status: Within Functional Limits for tasks assessed                                        General Comments General comments (skin integrity, edema, etc.): R TMA noted.  Nursing cleared pt for participation in physical therapy.  Pt agreeable to PT session and also cleared PT to call and talk to her granddaughter [name].    Exercises     Assessment/Plan    PT Assessment Patient needs continued PT services  PT Problem List Decreased strength;Decreased activity tolerance;Decreased balance;Decreased mobility;Decreased knowledge of use of DME;Decreased knowledge of precautions        PT Treatment Interventions DME instruction;Gait training;Functional mobility training;Therapeutic activities;Therapeutic exercise;Balance training;Patient/family education    PT Goals (Current goals can be found in the Care Plan section)  Acute Rehab PT Goals Patient Stated Goal: to improve strength and mobility PT Goal Formulation: With patient/family Time For Goal Achievement: 08/27/18 Potential to Achieve Goals: Fair    Frequency Min 2X/week   Barriers to discharge Decreased caregiver support Level of assist    Co-evaluation               AM-PAC PT "6 Clicks" Daily Activity  Outcome Measure Difficulty turning over in bed (including adjusting bedclothes, sheets and blankets)?: Unable Difficulty moving from lying on back to sitting on the side of the bed? : Unable Difficulty sitting down on and standing up from a chair with arms (e.g., wheelchair, bedside commode, etc,.)?: Unable Help needed moving to and from a bed to chair (including a wheelchair)?: A Lot Help needed walking in hospital room?: Total Help needed climbing 3-5 steps with a railing? : Total 6 Click Score: 7    End of Session Equipment Utilized During Treatment: Gait belt(up high away from colostomy) Activity Tolerance: Patient limited by fatigue  Patient left: in chair;with call bell/phone within reach;with chair alarm set;Other (comment)(B heels elevated via pillow) Nurse Communication: Mobility status;Precautions;Other (comment)(2 assist for transfer back to bed; purewick removed; notified NT) PT Visit Diagnosis: Unsteadiness on feet (R26.81);Other abnormalities of gait and mobility (R26.89);Muscle weakness (generalized) (M62.81)    Time: 9022-8406 PT Time Calculation (min) (ACUTE ONLY): 40 min   Charges:   PT Evaluation $PT Eval Low Complexity: 1 Low PT Treatments $Therapeutic Activity: 23-37 mins       Leitha Bleak, PT 08/13/18, 12:33 PM [phone]

## 2018-08-14 LAB — URINALYSIS, COMPLETE (UACMP) WITH MICROSCOPIC
BILIRUBIN URINE: NEGATIVE
GLUCOSE, UA: NEGATIVE mg/dL
Hgb urine dipstick: NEGATIVE
KETONES UR: NEGATIVE mg/dL
Nitrite: NEGATIVE
PH: 5 (ref 5.0–8.0)
Protein, ur: NEGATIVE mg/dL
Specific Gravity, Urine: 1.017 (ref 1.005–1.030)
WBC, UA: >50 WBC/hpf — ABNORMAL HIGH (ref 0–5)

## 2018-08-14 LAB — BASIC METABOLIC PANEL
Anion gap: 6 (ref 5–15)
BUN: 20 mg/dL (ref 8–23)
CALCIUM: 8.7 mg/dL — AB (ref 8.9–10.3)
CO2: 30 mmol/L (ref 22–32)
CREATININE: 0.58 mg/dL (ref 0.44–1.00)
Chloride: 108 mmol/L (ref 98–111)
GLUCOSE: 114 mg/dL — AB (ref 70–99)
Potassium: 4.2 mmol/L (ref 3.5–5.1)
Sodium: 144 mmol/L (ref 135–145)

## 2018-08-14 LAB — HIV ANTIBODY (ROUTINE TESTING W REFLEX): HIV SCREEN 4TH GENERATION: NONREACTIVE

## 2018-08-14 NOTE — Care Management (Signed)
RNCM received call from MD that patient may need nocturnal supplemental O2.  Patient will need an overnight oximetry and possibly sleep study in the future for CPAP evaluation. Patient is open to [facility] home health and will need resumption of care order.  Sharmon Revere with [facility] will assist with reminding patient to follow up with PCP for outpatient sleep study.  [facility]erral [facility] for potential nocturnal O2 need.

## 2018-08-14 NOTE — Progress Notes (Signed)
Patient ID: Yolanda Wells, female   DOB: 1932/06/12, 84 y.o.   MRN: [id number]  [employer] PROGRESS NOTE  Yolanda Wells PJK:[id number] DOB: February 10, 1932 DOA: 08/12/2018 PCP: [name], MD  HPI/Subjective: Patient's oxygen doing better.  Her oxygen did drop at nighttime.  Objective: Vitals:   08/13/18 2043 08/14/18 0636  BP: 121/64 129/74  Pulse: 87 76  Resp: (!) 21 18  Temp: 98.2 F (36.8 C) 98.1 F (36.7 C)  SpO2: 97% 96%    Filed Weights   08/12/18 1631  Weight: 91.6 kg    ROS: Review of Systems  Constitutional: Negative for chills and fever.  Eyes: Negative for blurred vision.  Respiratory: Negative for cough and shortness of breath.   Cardiovascular: Negative for chest pain.  Gastrointestinal: Negative for abdominal pain, constipation, diarrhea, nausea and vomiting.  Genitourinary: Negative for dysuria.  Musculoskeletal: Negative for joint pain.  Neurological: Negative for dizziness and headaches.   Exam: Physical Exam  HENT:  Nose: No mucosal edema.  Mouth/Throat: No oropharyngeal exudate or posterior oropharyngeal edema.  Eyes: Pupils are equal, round, and reactive to light. Conjunctivae, EOM and lids are normal.  Neck: No JVD present. Carotid bruit is not present. No edema present. No thyroid mass and no thyromegaly present.  Cardiovascular: S1 normal and S2 normal. Exam reveals no gallop.  No murmur heard. Pulses:      Dorsalis pedis pulses are 2+ on the right side, and 2+ on the left side.  Respiratory: No respiratory distress. She has decreased breath sounds in the right lower field and the left lower field. She has no wheezes. She has no rhonchi. She has no rales.  GI: Soft. Bowel sounds are normal. There is no tenderness.  Musculoskeletal:       Right ankle: She exhibits no swelling.       Left ankle: She exhibits no swelling.  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  Neurological: She is alert. No cranial nerve deficit.  Skin: Skin is warm. No rash noted. Nails show no clubbing.  Psychiatric: She has a normal mood and affect.      Data Reviewed: Basic Metabolic Panel: Recent Labs  Lab 08/12/18 1636 08/14/18 0435  NA 141 144  K 4.1 4.2  CL 106 108  CO2 28 30  GLUCOSE 115* 114*  BUN 11 20  CREATININE 0.55 0.58  CALCIUM 8.8* 8.7*   CBC: Recent Labs  Lab 08/12/18 1636  WBC 11.3*  HGB 9.8*  HCT 31.3*  MCV 82.6  PLT 446*   Cardiac Enzymes: Recent Labs  Lab 08/12/18 1636  TROPONINI <0.03   BNP (last 3 results) Recent Labs    04/06/18 1616 05/17/18 2036 06/21/18 0500  BNP 39.0 74.0 30.0      Recent Results (from the past 240 hour(s))  Culture, blood (routine x 2) Call MD if unable to obtain prior to antibiotics being given     Status: None (Preliminary result)   Collection Time: 08/12/18 11:15 PM  Result Value Ref Range Status   Specimen Description BLOOD BLOOD LEFT HAND  Final   Special Requests   Final    BOTTLES DRAWN AEROBIC AND ANAEROBIC Blood Culture adequate volume   Culture   Final    NO GROWTH 1 DAY Performed at [facility], [address], [place], [place] [address]    Report Status PENDING  Incomplete  Culture, blood (routine  x 2) Call MD if unable to obtain prior to antibiotics being given     Status: None (Preliminary result)   Collection Time: 08/12/18 11:25 PM  Result Value Ref Range Status   Specimen Description BLOOD BLOOD RIGHT HAND  Final   Special Requests   Final    BOTTLES DRAWN AEROBIC AND ANAEROBIC Blood Culture adequate volume   Culture   Final    NO GROWTH 1 DAY Performed at [name], [address], [place], [place] [address]    Report Status PENDING  Incomplete     Studies: Dg Chest 2 View  Result Date: 08/12/2018 CLINICAL DATA:  Chest pain and shortness of breath EXAM: CHEST - 2 VIEW COMPARISON:  06/15/2018 FINDINGS: Stable cardiomegaly and  basilar atelectasis. Right hemidiaphragm is chronically elevated. Central pulmonary arteries are enlarged compatible with pulmonary arterial hypertension when compared to prior CT. Upper lobes remain clear. No large effusion or pneumothorax. Trachea is midline. Atherosclerosis of aorta. Osteopenia and degenerative changes of the spine and shoulders. IMPRESSION: Stable cardiomegaly with basilar atelectasis and right hemidiaphragm elevation Enlarged central pulmonary arteries suggesting pulmonary arterial hypertension Atherosclerosis Electronically Signed   By: Jerilynn Mages.  Shick M.D.   On: 08/12/2018 17:12    Scheduled Meds: . amLODipine  5 mg Oral Daily  . apixaban  5 mg Oral BID  . aspirin EC  81 mg Oral Daily  . atorvastatin  20 mg Oral q1800  . calcium-vitamin D  1 tablet Oral Daily  . cholecalciferol  1,000 Units Oral Daily  . docusate sodium  100 mg Oral BID  . doxycycline  100 mg Oral BID WC  . feeding supplement (ENSURE ENLIVE)  237 mL Oral TID BM  . folic acid  0.5 mg Oral Daily  . guaiFENesin  600 mg Oral BID  . ipratropium-albuterol  3 mL Nebulization TID  . methylPREDNISolone (SOLU-MEDROL) injection  40 mg Intravenous Daily  . mometasone-formoterol  1 puff Inhalation BID  . multivitamin-lutein  1 capsule Oral Daily  . ruxolitinib phosphate  15 mg Oral BID   Continuous Infusions:  Assessment/Plan:  1. Acute hypoxic respiratory failure.  Overnight oxygen dropped in the 80s.  Granddaughter is concerned we will obtain overnight pulse oximetry 2. Asthmatic bronchitis.  Continue Solu-Medrol to daily   Continue nebulizer treatments. 3. Essential hypertension on Norvasc 4. History of pulmonary embolism with IVC filter placement in the past.  On Eliquis 5. History of polycythemia vera.  Platelet count acceptable hemoglobin stable at 9.8 6. History of stroke on aspirin and statin 7. Weakness.  Physical therapy evaluation  Code Status:     Code Status Orders  (From admission, onward)          Start     Ordered   08/13/18 1039  Full code  Continuous     08/13/18 1038        Code Status History    Date Active Date Inactive Code Status Order ID Comments User Context   08/12/2018 2001 08/13/2018 1038 DNR [id number]  Gorden Harms, MD ED   08/12/2018 2001 08/12/2018 2001 Full Code [id number]  Salary, Avel Peace, MD ED   06/15/2018 1635 06/18/2018 2009 Full Code [id number]  [name], MD Inpatient   02/24/2018 0347 02/27/2018 0027 Full Code [id number]  Amelia Jo, MD Inpatient   10/15/2017 1141 10/16/2017 2204 Full Code [id number]  Knox Royalty, NP Inpatient   10/13/2017 2246 10/15/2017 1141 DNR [id number]  Lance Coon, MD ED   10/06/2017 1454  10/09/2017 2058 DNR [id number]  Nicholes Mango, MD Inpatient   10/06/2017 1111 10/06/2017 1453 Full Code [id number]  Nicholes Mango, MD Inpatient   05/27/2017 1954 05/28/2017 2013 Full Code [id number]  Vaughan Basta, MD Inpatient   05/23/2017 2322 05/26/2017 1614 Full Code [id number]  Lance Coon, MD Inpatient   04/10/2017 0325 04/11/2017 1916 Full Code [id number]  Harrie Foreman, MD Inpatient   11/23/2016 0629 12/01/2016 1935 Full Code [id number]  Harrie Foreman, MD Inpatient   08/26/2016 0445 08/26/2016 1923 Full Code [id number]  Saundra Shelling, MD Inpatient   09/22/2015 1755 09/25/2015 1844 Full Code [id number]  Demetrios Loll, MD ED   09/15/2015 0341 09/17/2015 1741 Full Code [id number]  Harrie Foreman, MD Inpatient   07/16/2015 0151 07/20/2015 2334 Full Code [id number]  Lance Coon, MD Inpatient   05/25/2015 0834 05/29/2015 1616 Full Code [id number]  Harrie Foreman, MD Inpatient   04/02/2015 0606 04/08/2015 1651 Full Code [id number]  Harrie Foreman, MD Inpatient     Family Communication: Spoke with granddaughter on the phone and she is very nervous about the patient's oxygenation. Disposition Plan: Discharge tomorrow Time spent: 28 minutes  Candida Vetter [facility]

## 2018-08-15 LAB — CBC
HEMATOCRIT: 27.2 % — AB (ref 35.0–47.0)
Hemoglobin: 8.3 g/dL — ABNORMAL LOW (ref 12.0–16.0)
MCH: 26.2 pg (ref 26.0–34.0)
MCHC: 30.7 g/dL — ABNORMAL LOW (ref 32.0–36.0)
MCV: 85.5 fL (ref 80.0–100.0)
PLATELETS: 377 10*3/uL (ref 150–440)
RBC: 3.18 MIL/uL — ABNORMAL LOW (ref 3.80–5.20)
RDW: 32.2 % — AB (ref 11.5–14.5)
WBC: 9.9 10*3/uL (ref 3.6–11.0)

## 2018-08-15 LAB — CREATININE, SERUM
Creatinine, Ser: 0.58 mg/dL (ref 0.44–1.00)
GFR calc Af Amer: >60 mL/min (ref >60)
GFR calc non Af Amer: >60 mL/min (ref >60)

## 2018-08-15 MED ORDER — PREDNISONE 10 MG (21) PO TBPK: ORAL_TABLET | ORAL | 0 refills | Status: DC

## 2018-08-15 MED ORDER — MOMETASONE FURO-FORMOTEROL FUM 200-5 MCG/ACT IN AERO: 1.0000 | INHALATION_SPRAY | Freq: Two times a day (BID) | RESPIRATORY_TRACT | 0 refills | Status: DC

## 2018-08-15 MED ORDER — DOXYCYCLINE HYCLATE 100 MG PO TABS: 100.0000 mg | ORAL_TABLET | Freq: Two times a day (BID) | ORAL | 0 refills | Status: AC

## 2018-08-15 NOTE — Care Management (Addendum)
RNCM received call from Dr. Posey Pronto stating that patient will need nocturnal supplemental O2.  I have updated Jermaine with [facility] to assess criteria. [name] with [facility] home [facility] updated that patient will discharge to home today. Patient states she will need EMS to home.  I will confirm with granddaughter.

## 2018-08-15 NOTE — Progress Notes (Signed)
Reviewed discharge with patient's POA, [name]. EMS called to transport patient back to home.

## 2018-08-15 NOTE — Discharge Summary (Signed)
[employer] - [place] at [facility], 84 y.o., DOB 1932/02/21, MRN [id number]. Admission date: 08/12/2018 Discharge Date 08/15/2018 Primary MD [name], MD Admitting Physician Gorden Harms, MD  Admission Diagnosis  COPD exacerbation [facility]) [J44.1] Dyspnea, unspecified type [R06.00]  Discharge Diagnosis   Active Problems: Acute hypoxic respiratory failure due to acute bronchitis Morbid obesity with likely sleep apnea will need oxygen at nighttime Essential hypertension History of pulmonary embolism with IVC filter is in past History of polycythemia vera Previous history of CVA       Hospital Course Patient is 84 year old who presented to the hospital with complaint of shortness of breath patient was seen in the ED and was noted to have low oxygen saturation.  She was noted to be wheezing.  Patient was treated with nebulizers therapy and steroids with improvement in her symptoms.  She is currently on room air.  She was seen by speech therapy and recommended on a modified diet however patient did not want that.  Patient's granddaughter was also concerned about her grandmother having low oxygen saturation overnight she had a overnight pulse oximetry which showed some desaturations.  Patient was started on treatment with not show oxygen therapy.  She needs outpatient sleep study. Also PT recommends patient to go to skilled nursing facility however patient does not want to go to a rehab center.           Consults none Significant Tests:  See full reports for all details     Dg Chest 2 View  Result Date: 08/12/2018 CLINICAL DATA:  Chest pain and shortness of breath EXAM: CHEST - 2 VIEW COMPARISON:  06/15/2018 FINDINGS: Stable cardiomegaly and basilar atelectasis. Right hemidiaphragm is chronically elevated. Central pulmonary arteries are enlarged compatible with pulmonary arterial hypertension when compared to prior CT. Upper lobes remain clear.  No large effusion or pneumothorax. Trachea is midline. Atherosclerosis of aorta. Osteopenia and degenerative changes of the spine and shoulders. IMPRESSION: Stable cardiomegaly with basilar atelectasis and right hemidiaphragm elevation Enlarged central pulmonary arteries suggesting pulmonary arterial hypertension Atherosclerosis Electronically Signed   By: Jerilynn Mages.  Shick M.D.   On: 08/12/2018 17:12       Today   Subjective:   Rhae Lerner patient doing well denies any complaints Objective:   Blood pressure 133/66, pulse 75, temperature 98 F (36.7 C), temperature source Oral, resp. rate 16, height 5\' 7"  (1.702 m), weight 91.6 kg, SpO2 96 %.  .  Intake/Output Summary (Last 24 hours) at 08/15/2018 1253 Last data filed at 08/15/2018 0900 Gross per 24 hour  Intake 480 ml  Output -  Net 480 ml    Exam VITAL SIGNS: Blood pressure 133/66, pulse 75, temperature 98 F (36.7 C), temperature source Oral, resp. rate 16, height 5\' 7"  (1.702 m), weight 91.6 kg, SpO2 96 %.  GENERAL:  84 y.o.-year-old patient lying in the bed with no acute distress.  EYES: Pupils equal, round, reactive to light and accommodation. No scleral icterus. Extraocular muscles intact.  HEENT: Head atraumatic, normocephalic. Oropharynx and nasopharynx clear.  NECK:  Supple, no jugular venous distention. No thyroid enlargement, no tenderness.  LUNGS: Normal breath sounds bilaterally, no wheezing, rales,rhonchi or crepitation. No use of accessory muscles of respiration.  CARDIOVASCULAR: S1, S2 normal. No murmurs, rubs, or gallops.  ABDOMEN: Soft, nontender, nondistended. Bowel sounds present. No organomegaly or mass.  EXTREMITIES: No pedal edema, cyanosis, or clubbing.  NEUROLOGIC: Cranial nerves II through XII are intact. Muscle strength 5/5 in all  extremities. Sensation intact. Gait not checked.  PSYCHIATRIC: The patient is alert and oriented x 3.  SKIN: No obvious rash, lesion, or ulcer.   Data Review     CBC w Diff:   Lab Results  Component Value Date   WBC 9.9 08/15/2018   HGB 8.3 (L) 08/15/2018   HGB 13.8 07/08/2014   HCT 27.2 (L) 08/15/2018   HCT 42.0 07/08/2014   PLT 377 08/15/2018   PLT 834 (H) 07/08/2014   LYMPHOPCT 12 06/15/2018   LYMPHOPCT 21.1 07/08/2014   BANDSPCT 0 06/15/2018   MONOPCT 2 06/15/2018   MONOPCT 6.1 07/08/2014   EOSPCT 2 06/15/2018   EOSPCT 2.3 07/08/2014   BASOPCT 0 06/15/2018   BASOPCT 0.8 07/08/2014   CMP:  Lab Results  Component Value Date   NA 144 08/14/2018   NA 137 04/10/2015   NA 144 07/08/2014   K 4.2 08/14/2018   K 3.7 07/08/2014   CL 108 08/14/2018   CL 108 (H) 07/08/2014   CO2 30 08/14/2018   CO2 27 07/08/2014   BUN 20 08/14/2018   BUN 6 04/10/2015   BUN 15 07/08/2014   CREATININE 0.58 08/15/2018   CREATININE 0.64 07/08/2014   GLU 92 04/10/2015   PROT 7.4 06/15/2018   PROT 7.1 07/08/2014   ALBUMIN 3.4 (L) 06/15/2018   ALBUMIN 2.9 (L) 07/08/2014   BILITOT 0.9 06/15/2018   BILITOT 0.6 07/08/2014   ALKPHOS 71 06/15/2018   ALKPHOS 75 07/08/2014   AST 28 06/15/2018   AST 23 07/08/2014   ALT 10 06/15/2018   ALT 12 (L) 07/08/2014  .  Micro Results Recent Results (from the past 240 hour(s))  Culture, blood (routine x 2) Call MD if unable to obtain prior to antibiotics being given     Status: None (Preliminary result)   Collection Time: 08/12/18 11:15 PM  Result Value Ref Range Status   Specimen Description BLOOD BLOOD LEFT HAND  Final   Special Requests   Final    BOTTLES DRAWN AEROBIC AND ANAEROBIC Blood Culture adequate volume   Culture   Final    NO GROWTH 2 DAYS Performed at [facility], [address] [place], [place] [address]    Report Status PENDING  Incomplete  Culture, blood (routine x 2) Call MD if unable to obtain prior to antibiotics being given     Status: None (Preliminary result)   Collection Time: 08/12/18 11:25 PM  Result Value Ref Range Status   Specimen Description BLOOD BLOOD RIGHT HAND  Final    Special Requests   Final    BOTTLES DRAWN AEROBIC AND ANAEROBIC Blood Culture adequate volume   Culture   Final    NO GROWTH 2 DAYS Performed at [facility], [address], [place], [place] [address]    Report Status PENDING  Incomplete        Code Status Orders  (From admission, onward)         Start     Ordered   08/13/18 1039  Full code  Continuous     08/13/18 1038        Code Status History    Date Active Date Inactive Code Status Order ID Comments User Context   08/12/2018 2001 08/13/2018 1038 DNR [id number]  Gorden Harms, MD ED   08/12/2018 2001 08/12/2018 2001 Full Code [id number]  Salary, Avel Peace, MD ED   06/15/2018 1635 06/18/2018 2009 Full Code [id number]  [name], MD Inpatient  02/24/2018 0347 02/27/2018 0027 Full Code [id number]  Amelia Jo, MD Inpatient   10/15/2017 1141 10/16/2017 2204 Full Code [id number]  Knox Royalty, NP Inpatient   10/13/2017 2246 10/15/2017 1141 DNR [id number]  Lance Coon, MD ED   10/06/2017 1454 10/09/2017 2058 DNR [id number]  Nicholes Mango, MD Inpatient   10/06/2017 1111 10/06/2017 1453 Full Code [id number]  Nicholes Mango, MD Inpatient   05/27/2017 1954 05/28/2017 2013 Full Code [id number]  Vaughan Basta, MD Inpatient   05/23/2017 2322 05/26/2017 1614 Full Code [id number]  Lance Coon, MD Inpatient   04/10/2017 0325 04/11/2017 1916 Full Code [id number]  Harrie Foreman, MD Inpatient   11/23/2016 0629 12/01/2016 1935 Full Code [id number]  Harrie Foreman, MD Inpatient   08/26/2016 0445 08/26/2016 1923 Full Code [id number]  Saundra Shelling, MD Inpatient   09/22/2015 1755 09/25/2015 1844 Full Code [id number]  Demetrios Loll, MD ED   09/15/2015 0341 09/17/2015 1741 Full Code [id number]  Harrie Foreman, MD Inpatient   07/16/2015 0151 07/20/2015 2334 Full Code [id number]  Lance Coon, MD Inpatient   05/25/2015 0834 05/29/2015 1616 Full Code [id number]  Harrie Foreman, MD Inpatient   04/02/2015 0606 04/08/2015 1651 Full Code [id number]   Harrie Foreman, MD Inpatient          Follow-up Information    [name], MD. Schedule an appointment as soon as possible for a visit in 6 days.   Specialty:  Pediatrics Why:  Office Closed  Contact information: [place] [address] [phone]        Erby Pian, MD. Schedule an appointment as soon as possible for a visit in 1 week.   Specialty:  Specialist Why:  needs sleep study eval -- Office Closed  Contact information: [place] [address] [phone]           Discharge Medications   Allergies as of 08/15/2018   No Known Allergies     Medication List    STOP taking these medications   sulfamethoxazole-trimethoprim 800-160 MG tablet Commonly known as:  BACTRIM DS,SEPTRA DS     TAKE these medications   amLODipine 5 MG tablet Commonly known as:  NORVASC Take 5 mg by mouth daily.   aspirin EC 81 MG tablet Take 1 tablet (81 mg total) by mouth daily.   calcium-vitamin D 250-125 MG-UNIT tablet Commonly known as:  OSCAL WITH D Take 1 tablet by mouth daily.   cholecalciferol 1000 units tablet Commonly known as:  VITAMIN D Take 1,000 Units by mouth daily.   conjugated estrogens vaginal cream Commonly known as:  PREMARIN Place 1 Applicatorful vaginally daily. Apply 0.5mg  (pea-sized amount)  just inside the vaginal introitus with a finger-tip on  Monday, Wednesday and Friday nights.   docusate sodium 100 MG capsule Commonly known as:  COLACE Take 100 mg by mouth 2 (two) times daily.   doxycycline 100 MG tablet Commonly known as:  VIBRA-TABS Take 1 tablet (100 mg total) by mouth 2 (two) times daily with a meal for 5 days.   ELIQUIS 5 MG Tabs tablet Generic drug:  apixaban TAKE ONE TABLET BY MOUTH TWICE A DAY   estradiol 0.1 MG/GM vaginal cream Commonly known as:  ESTRACE Apply 0.5mg  (pea-sized amount)  just inside the vaginal introitus with a finger-tip on Monday, Wednesday and Friday  nights.   folic acid 326 MCG tablet Commonly known as:  FOLVITE Take 400 mcg by mouth daily.   ipratropium-albuterol 0.5-2.5 (3) MG/3ML  Soln Commonly known as:  DUONEB Take 3 mLs by nebulization every 4 (four) hours as needed.   JAKAFI 15 MG tablet Generic drug:  ruxolitinib phosphate TAKE 1 TABLET (15 MG TOTAL) BY MOUTH 2 TIMES DAILY. What changed:  See the new instructions.   mometasone-formoterol 200-5 MCG/ACT Aero Commonly known as:  DULERA Inhale 1 puff into the lungs 2 (two) times daily.   predniSONE 10 MG (21) Tbpk tablet Commonly known as:  STERAPRED UNI-PAK 21 TAB Start at 60mg  taper by 10mg  until complete            Durable Medical Equipment  (From admission, onward)         Start     Ordered   08/15/18 1006  DME Oxygen  Once    Comments:  Pt with morbid obsity, with likely sleep apnea with drop in oxygen saturation during night  Question Answer Comment  Mode or (Route) Nasal cannula   Liters per Minute 2   Frequency Only at night (stationary unit needed)   Oxygen delivery system Gas      08/15/18 1006             Total Time in preparing paper work, data evaluation and todays exam - 43 minutes  Dustin Flock M.D on 08/15/2018 at 12:53 PM [place]  [phone]

## 2018-08-18 LAB — CULTURE, BLOOD (ROUTINE X 2)
Culture: NO GROWTH
Culture: NO GROWTH
SPECIAL REQUESTS: ADEQUATE
SPECIAL REQUESTS: ADEQUATE

## 2018-08-30 ENCOUNTER — Observation Stay: Payer: Medicare Other

## 2018-08-30 ENCOUNTER — Emergency Department: Payer: Medicare Other

## 2018-08-30 ENCOUNTER — Observation Stay (HOSPITAL_COMMUNITY): Payer: Medicare Other

## 2018-08-30 ENCOUNTER — Observation Stay
Admission: EM | Admit: 2018-08-30 | Discharge: 2018-09-01 | Disposition: A | Discharge status: Home / Self Care | Payer: Medicare Other | Attending: Internal Medicine | Admitting: Internal Medicine

## 2018-08-30 ENCOUNTER — Other Ambulatory Visit: Payer: Self-pay

## 2018-08-30 DIAGNOSIS — D649 Anemia, unspecified: Secondary | ICD-10-CM | POA: Insufficient documentation

## 2018-08-30 DIAGNOSIS — Z8673 Personal history of transient ischemic attack (TIA), and cerebral infarction without residual deficits: Secondary | ICD-10-CM | POA: Insufficient documentation

## 2018-08-30 DIAGNOSIS — K5790 Diverticulosis of intestine, part unspecified, without perforation or abscess without bleeding: Secondary | ICD-10-CM | POA: Insufficient documentation

## 2018-08-30 DIAGNOSIS — H5461 Unqualified visual loss, right eye, normal vision left eye: Secondary | ICD-10-CM | POA: Insufficient documentation

## 2018-08-30 DIAGNOSIS — Z9049 Acquired absence of other specified parts of digestive tract: Secondary | ICD-10-CM | POA: Insufficient documentation

## 2018-08-30 DIAGNOSIS — J449 Chronic obstructive pulmonary disease, unspecified: Secondary | ICD-10-CM | POA: Insufficient documentation

## 2018-08-30 DIAGNOSIS — R4182 Altered mental status, unspecified: Principal | ICD-10-CM | POA: Insufficient documentation

## 2018-08-30 DIAGNOSIS — B9689 Other specified bacterial agents as the cause of diseases classified elsewhere: Secondary | ICD-10-CM | POA: Diagnosis not present

## 2018-08-30 DIAGNOSIS — Z79899 Other long term (current) drug therapy: Secondary | ICD-10-CM | POA: Diagnosis not present

## 2018-08-30 DIAGNOSIS — Z86711 Personal history of pulmonary embolism: Secondary | ICD-10-CM | POA: Insufficient documentation

## 2018-08-30 DIAGNOSIS — E876 Hypokalemia: Secondary | ICD-10-CM | POA: Insufficient documentation

## 2018-08-30 DIAGNOSIS — F0150 Vascular dementia without behavioral disturbance: Secondary | ICD-10-CM | POA: Insufficient documentation

## 2018-08-30 DIAGNOSIS — Z7982 Long term (current) use of aspirin: Secondary | ICD-10-CM | POA: Insufficient documentation

## 2018-08-30 DIAGNOSIS — M359 Systemic involvement of connective tissue, unspecified: Secondary | ICD-10-CM | POA: Diagnosis not present

## 2018-08-30 DIAGNOSIS — R4781 Slurred speech: Secondary | ICD-10-CM | POA: Diagnosis not present

## 2018-08-30 DIAGNOSIS — Z96643 Presence of artificial hip joint, bilateral: Secondary | ICD-10-CM | POA: Insufficient documentation

## 2018-08-30 DIAGNOSIS — N3001 Acute cystitis with hematuria: Secondary | ICD-10-CM | POA: Diagnosis not present

## 2018-08-30 DIAGNOSIS — Z933 Colostomy status: Secondary | ICD-10-CM | POA: Insufficient documentation

## 2018-08-30 DIAGNOSIS — G459 Transient cerebral ischemic attack, unspecified: Secondary | ICD-10-CM | POA: Diagnosis present

## 2018-08-30 DIAGNOSIS — R410 Disorientation, unspecified: Secondary | ICD-10-CM | POA: Diagnosis present

## 2018-08-30 DIAGNOSIS — I110 Hypertensive heart disease with heart failure: Secondary | ICD-10-CM | POA: Diagnosis not present

## 2018-08-30 DIAGNOSIS — Z7901 Long term (current) use of anticoagulants: Secondary | ICD-10-CM | POA: Diagnosis not present

## 2018-08-30 DIAGNOSIS — I509 Heart failure, unspecified: Secondary | ICD-10-CM | POA: Diagnosis not present

## 2018-08-30 DIAGNOSIS — M1990 Unspecified osteoarthritis, unspecified site: Secondary | ICD-10-CM | POA: Diagnosis not present

## 2018-08-30 DIAGNOSIS — G9340 Encephalopathy, unspecified: Secondary | ICD-10-CM | POA: Insufficient documentation

## 2018-08-30 DIAGNOSIS — R32 Unspecified urinary incontinence: Secondary | ICD-10-CM | POA: Insufficient documentation

## 2018-08-30 DIAGNOSIS — I081 Rheumatic disorders of both mitral and tricuspid valves: Secondary | ICD-10-CM | POA: Insufficient documentation

## 2018-08-30 DIAGNOSIS — I2782 Chronic pulmonary embolism: Secondary | ICD-10-CM

## 2018-08-30 DIAGNOSIS — I4510 Unspecified right bundle-branch block: Secondary | ICD-10-CM | POA: Insufficient documentation

## 2018-08-30 DIAGNOSIS — Z8249 Family history of ischemic heart disease and other diseases of the circulatory system: Secondary | ICD-10-CM | POA: Insufficient documentation

## 2018-08-30 DIAGNOSIS — R471 Dysarthria and anarthria: Secondary | ICD-10-CM

## 2018-08-30 DIAGNOSIS — B351 Tinea unguium: Secondary | ICD-10-CM | POA: Diagnosis not present

## 2018-08-30 DIAGNOSIS — Z833 Family history of diabetes mellitus: Secondary | ICD-10-CM | POA: Insufficient documentation

## 2018-08-30 DIAGNOSIS — Z825 Family history of asthma and other chronic lower respiratory diseases: Secondary | ICD-10-CM | POA: Insufficient documentation

## 2018-08-30 DIAGNOSIS — Z89431 Acquired absence of right foot: Secondary | ICD-10-CM | POA: Insufficient documentation

## 2018-08-30 DIAGNOSIS — Z90710 Acquired absence of both cervix and uterus: Secondary | ICD-10-CM | POA: Diagnosis not present

## 2018-08-30 DIAGNOSIS — E538 Deficiency of other specified B group vitamins: Secondary | ICD-10-CM | POA: Insufficient documentation

## 2018-08-30 DIAGNOSIS — Z87440 Personal history of urinary (tract) infections: Secondary | ICD-10-CM | POA: Insufficient documentation

## 2018-08-30 DIAGNOSIS — Z8489 Family history of other specified conditions: Secondary | ICD-10-CM | POA: Insufficient documentation

## 2018-08-30 DIAGNOSIS — I5032 Chronic diastolic (congestive) heart failure: Secondary | ICD-10-CM | POA: Diagnosis not present

## 2018-08-30 DIAGNOSIS — D45 Polycythemia vera: Secondary | ICD-10-CM | POA: Insufficient documentation

## 2018-08-30 DIAGNOSIS — Z86718 Personal history of other venous thrombosis and embolism: Secondary | ICD-10-CM | POA: Insufficient documentation

## 2018-08-30 DIAGNOSIS — Z8261 Family history of arthritis: Secondary | ICD-10-CM | POA: Insufficient documentation

## 2018-08-30 DIAGNOSIS — I700 Atherosclerosis of aorta: Secondary | ICD-10-CM | POA: Insufficient documentation

## 2018-08-30 DIAGNOSIS — Z808 Family history of malignant neoplasm of other organs or systems: Secondary | ICD-10-CM | POA: Insufficient documentation

## 2018-08-30 LAB — URINALYSIS, ROUTINE W REFLEX MICROSCOPIC
Bacteria, UA: NONE SEEN
Bilirubin Urine: NEGATIVE
GLUCOSE, UA: NEGATIVE mg/dL
Ketones, ur: NEGATIVE mg/dL
Nitrite: NEGATIVE
PH: 5 (ref 5.0–8.0)
PROTEIN: NEGATIVE mg/dL
Specific Gravity, Urine: 1.018 (ref 1.005–1.030)

## 2018-08-30 LAB — CBC WITH DIFFERENTIAL/PLATELET
Basophils Absolute: 0 10*3/uL (ref 0.0–0.1)
Basophils Relative: 0 %
Eosinophils Absolute: 0 10*3/uL (ref 0.0–0.5)
Eosinophils Relative: 0 %
HCT: 34.3 % — ABNORMAL LOW (ref 36.0–46.0)
Hemoglobin: 10 g/dL — ABNORMAL LOW (ref 12.0–15.0)
LYMPHS ABS: 1.3 10*3/uL (ref 0.7–4.0)
LYMPHS PCT: 14 %
MCH: 25.5 pg — ABNORMAL LOW (ref 26.0–34.0)
MCHC: 29.2 g/dL — ABNORMAL LOW (ref 30.0–36.0)
MCV: 87.5 fL (ref 80.0–100.0)
MONO ABS: 0.3 10*3/uL (ref 0.1–1.0)
MONOS PCT: 3 %
NEUTROS ABS: 7.7 10*3/uL (ref 1.7–7.7)
NRBC: 1 /100{WBCs} — AB
Neutrophils Relative %: 83 %
PLATELETS: 238 10*3/uL (ref 150–400)
RBC: 3.92 MIL/uL (ref 3.87–5.11)
RDW: 31.4 % — ABNORMAL HIGH (ref 11.5–15.5)
WBC: 9.3 10*3/uL (ref 4.0–10.5)
nRBC: 1 % — ABNORMAL HIGH (ref 0.0–0.2)

## 2018-08-30 LAB — COMPREHENSIVE METABOLIC PANEL
ALBUMIN: 2.9 g/dL — AB (ref 3.5–5.0)
ALT: 11 U/L (ref 0–44)
ANION GAP: 3 — AB (ref 5–15)
AST: 17 U/L (ref 15–41)
Alkaline Phosphatase: 61 U/L (ref 38–126)
BUN: 10 mg/dL (ref 8–23)
CALCIUM: 8.4 mg/dL — AB (ref 8.9–10.3)
CHLORIDE: 104 mmol/L (ref 98–111)
CO2: 31 mmol/L (ref 22–32)
Creatinine, Ser: 0.52 mg/dL (ref 0.44–1.00)
GFR calc Af Amer: >60 mL/min (ref >60)
GFR calc non Af Amer: >60 mL/min (ref >60)
GLUCOSE: 96 mg/dL (ref 70–99)
Potassium: 3.9 mmol/L (ref 3.5–5.1)
SODIUM: 138 mmol/L (ref 135–145)
Total Bilirubin: 0.7 mg/dL (ref 0.3–1.2)
Total Protein: 6.4 g/dL — ABNORMAL LOW (ref 6.5–8.1)

## 2018-08-30 MED ORDER — ACETAMINOPHEN 650 MG RE SUPP: 650.0000 mg | RECTAL | Status: DC | PRN

## 2018-08-30 MED ORDER — ENOXAPARIN SODIUM 40 MG/0.4ML ~~LOC~~ SOLN
40.0000 mg | SUBCUTANEOUS | Status: DC
  Administered 2018-08-30: 40 mg via SUBCUTANEOUS
  Filled 2018-08-30: qty 0.4

## 2018-08-30 MED ORDER — SODIUM CHLORIDE 0.9 % IV SOLN
Freq: Once | INTRAVENOUS | Status: AC
  Administered 2018-08-30: 23:00:00 via INTRAVENOUS

## 2018-08-30 MED ORDER — ACETAMINOPHEN 160 MG/5ML PO SOLN
650.0000 mg | ORAL | Status: DC | PRN
  Filled 2018-08-30: qty 20.3

## 2018-08-30 MED ORDER — ACETAMINOPHEN 325 MG PO TABS
650.0000 mg | ORAL_TABLET | ORAL | Status: DC | PRN
  Administered 2018-08-31: 650 mg via ORAL
  Filled 2018-08-30: qty 2

## 2018-08-30 MED ORDER — STROKE: EARLY STAGES OF RECOVERY BOOK
Freq: Once | Status: AC
  Administered 2018-08-30: 23:00:00

## 2018-08-30 NOTE — ED Notes (Signed)
Attempted IV without success. RN straight stuck pt with butterfly needle for ordered blood work. Pt reporting frustration with no UTI dx and verbalized multiple times, "I told my granddaughter it wasn't a UTI. I have been there a thousand times and they keep misdiagnosing me" "they don't ever feed me here, I should sue." at this time RN offered food and pt replied, "Oh, with peanut butter crackers, no thank you that is not food." RN apologized and pt verbalized she was okay and did not actually need food at this time. Bed locked an in lowest position. Lights dimmed and tv on per pt request. No family at bedside yet. Call bell in reach.

## 2018-08-30 NOTE — H&P (Signed)
[facility] [place] NAME: Yolanda Wells    MR#:  [id number]  DATE OF BIRTH:  1932-09-16  DATE OF ADMISSION:  08/30/2018  PRIMARY CARE PHYSICIAN: [name], MD   REQUESTING/REFERRING PHYSICIAN: Rudene Re, MD  CHIEF COMPLAINT:  Worsening of dysarthria and left-sided weakness  HISTORY OF PRESENT ILLNESS:  Yolanda Wells  is a 84 y.o. female with a known history of previous CVA with chronic left-sided weakness, COPD, history of pulmonary embolism and DVT on Eliquis, chronic congestive heart failure not fluid overload, hypertension is brought into the ED by her granddaughter as patient was extremely weak on the left side and unable to move her today.  Her speech is also worse.  Initially they were concerned about a urinary tract infection but urinalysis with small leukoesterase and CT head is normal.  Patient was answering questions appropriately though she has some speech difficulties and left-sided weakness during my examination.  PAST MEDICAL HISTORY:   Past Medical History:  Diagnosis Date  . Acute deep vein thrombosis (DVT) of distal vein of left lower extremity ([place]) 10/03/2015  . Acute pulmonary embolism ([place]) 10/03/2015  . Anticoagulant long-term use   . Arthritis   . Arthritis   . CHF (congestive heart failure) ([place])   . Chronic anticoagulation 10/03/2015  . Collagen vascular disease ([place])   . Colostomy in place [facility]) 10/04/2015  . Deep venous thrombosis ([facility])    right lower extremity  . Dependent edema   . Diverticulosis of intestine with bleeding 04/02/2015  . Glenohumeral arthritis 06/28/2012  . Hammertoe 09/02/2012  . History of bilateral hip replacements 09/02/2012  . History of hysterectomy   . Hypertension   . Hypertension   . Hypokalemia   . Inability to ambulate due to hip 10/12/2015  . Mandibular dysfunction   . Onychomycosis 09/02/2012  . Osteoarthritis   . Polycythemia vera ([place])   .  Presence of IVC filter 05/25/2015  . Stroke ([place]) 03/26/2015   cerebellar  . Urinary incontinence in female 09/02/2016    PAST SURGICAL HISTOIRY:   Past Surgical History:  Procedure Laterality Date  . ABDOMINAL HYSTERECTOMY    . BLEPHAROPLASTY Right 03/2017   right upper eyelid  . CARDIAC CATHETERIZATION Right 04/03/2015   Procedure: CENTRAL LINE INSERTION;  Surgeon: Sherri Rad, MD;  Location: [facility] ORS;  Service: General;  Laterality: Right;  . COLECTOMY WITH COLOSTOMY CREATION/HARTMANN PROCEDURE N/A 04/03/2015   Procedure: COLECTOMY WITH COLOSTOMY CREATION/HARTMANN PROCEDURE;  Surgeon: Sherri Rad, MD;  Location: [facility] ORS;  Service: General;  Laterality: N/A;  . COLON SURGERY    . FOOT AMPUTATION     partial  . FOOT AMPUTATION Right   . JOINT REPLACEMENT     Left and Right Hip  . PERIPHERAL VASCULAR CATHETERIZATION N/A 04/02/2015   Procedure: Visceral Angiography;  Surgeon: Algernon Huxley, MD;  Location: [place] CV LAB;  Service: Cardiovascular;  Laterality: N/A;  . PERIPHERAL VASCULAR CATHETERIZATION N/A 04/02/2015   Procedure: Visceral Artery Intervention;  Surgeon: Algernon Huxley, MD;  Location: [place] CV LAB;  Service: Cardiovascular;  Laterality: N/A;  . PERIPHERAL VASCULAR CATHETERIZATION N/A 05/25/2015   Procedure: IVC Filter Insertion;  Surgeon: Katha Cabal, MD;  Location: [place] CV LAB;  Service: Cardiovascular;  Laterality: N/A;  . TOE AMPUTATION     right  . TOTAL HIP ARTHROPLASTY      SOCIAL HISTORY:   Social History   Tobacco Use  . Smoking  status: Never Smoker  . Smokeless tobacco: Never Used  Substance Use Topics  . Alcohol use: No    FAMILY HISTORY:   Family History  Problem Relation Age of Onset  . Brain cancer Mother   . Heart attack Father   . COPD Father   . Coronary artery disease Father   . Hypertension Unknown   . Arthritis-Osteo Unknown   . Diabetes Brother   . Arthritis Brother   . Osteosarcoma Son   . Bone cancer Son    . Arthritis Sister   . Arthritis Brother   . Hypertension Brother   . Coronary artery disease Brother   . Malignant hyperthermia Brother     DRUG ALLERGIES:  No Known Allergies  REVIEW OF SYSTEMS:  CONSTITUTIONAL: No fever, fatigue or weakness.  EYES: No blurred or double vision.  EARS, NOSE, AND THROAT: No tinnitus or ear pain.  RESPIRATORY: No cough, shortness of breath, wheezing or hemoptysis.  CARDIOVASCULAR: No chest pain, orthopnea, edema.  GASTROINTESTINAL: No nausea, vomiting, diarrhea or abdominal pain.  GENITOURINARY: No dysuria, hematuria.  ENDOCRINE: No polyuria, nocturia,  HEMATOLOGY: No anemia, easy bruising or bleeding SKIN: No rash or lesion. MUSCULOSKELETAL: No joint pain or arthritis.   NEUROLOGIC: Dysarthria and worsening of left-sided weakness PSYCHIATRY: No anxiety or depression.   MEDICATIONS AT HOME:   Prior to Admission medications   Medication Sig Start Date End Date Taking? Authorizing Provider  amLODipine (NORVASC) 5 MG tablet Take 5 mg by mouth daily.    [provider]  aspirin EC 81 MG tablet Take 1 tablet (81 mg total) by mouth daily. 09/16/15   [name], MD  calcium-vitamin D (OSCAL WITH D) 250-125 MG-UNIT tablet Take 1 tablet by mouth daily.    [provider]  cholecalciferol (VITAMIN D) 1000 units tablet Take 1,000 Units by mouth daily.    [provider]  conjugated estrogens (PREMARIN) vaginal cream Place 1 Applicatorful vaginally daily. Apply 0.5mg  (pea-sized amount)  just inside the vaginal introitus with a finger-tip on  Monday, Wednesday and Friday nights. Patient not taking: Reported on 08/12/2018 08/09/18   Zara Council A, PA-C  docusate sodium (COLACE) 100 MG capsule Take 100 mg by mouth 2 (two) times daily.     [provider]  ELIQUIS 5 MG TABS tablet TAKE ONE TABLET BY MOUTH TWICE A DAY 07/12/18   Lequita Asal, MD  estradiol (ESTRACE VAGINAL) 0.1 MG/GM vaginal cream Apply 0.5mg   (pea-sized amount)  just inside the vaginal introitus with a finger-tip on Monday, Wednesday and Friday nights. 08/09/18   Zara Council A, PA-C  folic acid (FOLVITE) 161 MCG tablet Take 400 mcg by mouth daily.    [provider]  ipratropium-albuterol (DUONEB) 0.5-2.5 (3) MG/3ML SOLN Take 3 mLs by nebulization every 4 (four) hours as needed.    [provider]  JAKAFI 15 MG tablet TAKE 1 TABLET (15 MG TOTAL) BY MOUTH 2 TIMES DAILY. Patient taking differently: Take 15 mg by mouth 2 (two) times daily.  07/07/18   Lequita Asal, MD  mometasone-formoterol (DULERA) 200-5 MCG/ACT AERO Inhale 1 puff into the lungs 2 (two) times daily. 08/15/18   Dustin Flock, MD  predniSONE (STERAPRED UNI-PAK 21 TAB) 10 MG (21) TBPK tablet Start at 60mg  taper by 10mg  until complete 08/15/18   Dustin Flock, MD      VITAL SIGNS:  Blood pressure 137/79, pulse 73, temperature 98.7 F (37.1 C), temperature source Oral, resp. rate 18, height 5\' 7"  (  1.702 m), weight 91.6 kg, SpO2 95 %.  PHYSICAL EXAMINATION:  GENERAL:  84 y.o.-year-old patient lying in the bed with no acute distress.  EYES: Pupils equal, round, reactive to light and accommodation. No scleral icterus. Extraocular muscles intact.  HEENT: Head atraumatic, normocephalic. Oropharynx and nasopharynx clear.  NECK:  Supple, no jugular venous distention. No thyroid enlargement, no tenderness.  LUNGS: Normal breath sounds bilaterally, no wheezing, rales,rhonchi or crepitation. No use of accessory muscles of respiration.  CARDIOVASCULAR: S1, S2 normal. No murmurs, rubs, or gallops.  ABDOMEN: Soft, nontender, nondistended. Bowel sounds present. No organomegaly or mass.  EXTREMITIES: No pedal edema, cyanosis, or clubbing.  NEUROLOGIC: Awake, alert, oriented x3, dysarthria is present.  No deviation of the angle of the mouth.  Motor on the left upper correct extremity and lower extremity 2 out of 5.  Right upper and lower extremity 4 out of 5  sensation intact. Gait not checked.  PSYCHIATRIC: The patient is alert and oriented x 3.  SKIN: No obvious rash, lesion, or ulcer.   LABORATORY PANEL:   CBC Recent Labs  Lab 08/30/18 1600  WBC 9.3  HGB 10.0*  HCT 34.3*  PLT 238   ------------------------------------------------------------------------------------------------------------------  Chemistries  Recent Labs  Lab 08/30/18 1600  NA 138  K 3.9  CL 104  CO2 31  GLUCOSE 96  BUN 10  CREATININE 0.52  CALCIUM 8.4*  AST 17  ALT 11  ALKPHOS 61  BILITOT 0.7   ------------------------------------------------------------------------------------------------------------------  Cardiac Enzymes No results for input(s): TROPONINI in the last 168 hours. ------------------------------------------------------------------------------------------------------------------  RADIOLOGY:  Dg Chest 2 View  Result Date: 08/30/2018 CLINICAL DATA:  84 y/o  F; possible UTI. EXAM: CHEST - 2 VIEW COMPARISON:  08/12/2018 chest radiograph FINDINGS: Stable cardiac silhouette given projection and technique. Aortic atherosclerosis with calcification. Enlarged central pulmonary arteries may represent pulmonary artery hypertension. No focal consolidation. No pleural effusion or pneumothorax. Osteoarthrosis of the shoulder joints with osteophytosis. Bones are demineralized. Thoracic spine dextrocurvature. IMPRESSION: 1. No acute pulmonary process identified. 2. Enlarged central pulmonary arteries may represent pulmonary artery hypertension. 3.  Aortic Atherosclerosis (ICD10-I70.0). Electronically Signed   By: Kristine Garbe M.D.   On: 08/30/2018 16:37   Ct Head Wo Contrast  Result Date: 08/30/2018 CLINICAL DATA:  84 year old female with frequent UTIs. Confusion and weak. Initial encounter. EXAM: CT HEAD WITHOUT CONTRAST TECHNIQUE: Contiguous axial images were obtained from the base of the skull through the vertex without intravenous  contrast. COMPARISON:  06/15/2018 head CT.  08/26/2016 brain MR. FINDINGS: Brain: No intracranial hemorrhage or CT evidence of large acute infarct. Global atrophy with prominent subarachnoid spaces unchanged. No intracranial mass lesion noted on this unenhanced exam. Expanded partially empty sella unchanged. Vascular: No hyperdense vessel.  Vascular calcifications. Skull: No acute calvarial abnormality. Sinuses/Orbits: No acute orbital abnormality. Visualized sinuses clear. Other: Mastoid air cells and middle ear cavities clear. IMPRESSION: 1. No acute intracranial abnormality. 2. Atrophy Electronically Signed   By: Genia Del M.D.   On: 08/30/2018 16:38    EKG:   Orders placed or performed during the hospital encounter of 08/30/18  . ED EKG  . ED EKG  . EKG 12-Lead  . EKG 12-Lead    IMPRESSION AND PLAN:     #TIA versus CVA-with worsening of dysarthria and worsening of chronic left-sided weakness Admit to MedSurg floor CT head is negative We will get stroke work-up with MRI of the brain, carotid Dopplers and 2D echocardiogram Neurochecks Bedside swallow evaluation Check TSH, fasting  lipid panel and hemoglobin A1c Neurology consult placed PT/OT evaluation  #History of DVT/pulmonary embolism Old medication list states patient is on Eliquis but med reconciliation not done.  Will resume Eliquis after med reconciliation Currently patient is n.p.o. awaiting for bedside swallow eval  #COPD-no exacerbation noticed Provide nebulizer treatments as needed  #Essential hypertension Allow permissive hypertension.  Patient is n.p.o.  #Previous history of stroke with chronic left-sided weakness Patient is getting physical therapy at home  DVT prophylaxis with Lovenox subcu  All the records are reviewed and case discussed with ED provider. Management plans discussed with the patient, family and they are in agreement.  CODE STATUS: fc   TOTAL TIME TAKING CARE OF THIS PATIENT: 42   minutes.   Note: This dictation was prepared with Dragon dictation along with smaller phrase technology. Any transcriptional errors that result from this process are unintentional.  Nicholes Mango M.D on 08/30/2018 at 7:07 PM  Between 7am to 6pm - Pager - [phone]  After 6pm go to [email or web address] - password [email or web address] [facility]  Office  [phone]  CC: Primary care physician; [name], MD

## 2018-08-30 NOTE — Progress Notes (Signed)
Family Meeting Note  Advance Directive:yes  Today a meeting took place with the Patient.     The following clinical team members were present during this meeting:MD  The following were discussed:Patient's diagnosis: TIA/CVA, history of pulmonary embolism, COPD, essential hypertension, history of stroke with chronic left-sided weakness, chronic congestive heart failure, treatment plan of care discussed in detail with the patient and her granddaughter.  They both verbalized understanding of the plan.     Patient's progosis: Unable to determine and Goals for treatment: Full Code, healthcare power of attorney granddaughter Bethann Humble  Additional follow-up to be provided: Hospitalist, neurology  Time spent during discussion:17 min   Nicholes Mango, MD

## 2018-08-30 NOTE — ED Notes (Signed)
Pt incont of large amount of urine; pt clensed and attends changed; removed pt's black velcro shoes and long sleeve print shirt--placed in pt belonging bag at bedside & pt dressed into hosp gown; pt A&O3 and voices good understanding of plan of care; granddaughter at bedside spoke with MRI tech at pt's request; now leaving to home at present; pt transported to admission room by EDT May[place]

## 2018-08-30 NOTE — ED Notes (Signed)
MD at bedside. 

## 2018-08-30 NOTE — ED Triage Notes (Signed)
Pt to ED from home c/o possible UTI. Per EMS pt family reports pt has hx of frequent UTIs and are concerned because the pt has been confused and weak as well as c/o painful urination. Pt a&ox4 at this time and denies pain.

## 2018-08-30 NOTE — ED Provider Notes (Addendum)
[facility] Emergency Department Provider Note  ____________________________________________  Time seen: Approximately 4:10 PM  I have reviewed the triage vital signs and the nursing notes.   HISTORY  Chief Complaint Urinary Tract Infection   HPI Yolanda Wells is a 84 y.o. female history of COPD, PE and DVT on Eliquis, CHF, hypertension, hypokalemia, CVA with chronic left lower extremity weakness who presents for evaluation of confusion and generalized weakness.  Patient was recently discharged from the hospital 2 weeks ago after being admitted for COPD exacerbation.  She reports that since being discharged she has had progressively worsening generalized weakness.  The family today felt the patient was more confused than baseline and due to history of recurrent UTIs decided to send her to the emergency room for evaluation.  Patient does report having dysuria for the last few days.  She denies abdominal pain, nausea or vomiting, fever or chills, flank pain, back pain, chest pain or shortness of breath.  No cough or URI symptoms.  Patient denies feeling confused.  She denies facial droop, changes in her speech, new localized weakness or numbness.  Past Medical History:  Diagnosis Date  . Acute deep vein thrombosis (DVT) of distal vein of left lower extremity ([place]) 10/03/2015  . Acute pulmonary embolism ([place]) 10/03/2015  . Anticoagulant long-term use   . Arthritis   . Arthritis   . CHF (congestive heart failure) ([place])   . Chronic anticoagulation 10/03/2015  . Collagen vascular disease ([place])   . Colostomy in place [facility]) 10/04/2015  . Deep venous thrombosis ([facility])    right lower extremity  . Dependent edema   . Diverticulosis of intestine with bleeding 04/02/2015  . Glenohumeral arthritis 06/28/2012  . Hammertoe 09/02/2012  . History of bilateral hip replacements 09/02/2012  . History of hysterectomy   . Hypertension   . Hypertension   . Hypokalemia   .  Inability to ambulate due to hip 10/12/2015  . Mandibular dysfunction   . Onychomycosis 09/02/2012  . Osteoarthritis   . Polycythemia vera ([place])   . Presence of IVC filter 05/25/2015  . Stroke ([place]) 03/26/2015   cerebellar  . Urinary incontinence in female 09/02/2016    Patient Active Problem List   Diagnosis Date Noted  . Pressure injury of skin 08/13/2018  . B12 deficiency 08/04/2018  . Anemia 06/07/2018  . Secondary myelofibrosis ([place]) 02/17/2018  . Goals of care, counseling/discussion 12/23/2017  . Palliative care by specialist   . DNR (do not resuscitate) discussion   . Weakness generalized   . Chronic diastolic CHF (congestive heart failure) ([place]) 07/28/2017  . Vascular dementia without behavioral disturbance ([place]) 07/08/2017  . Influenza with respiratory manifestation 12/01/2016  . Respiratory distress 11/23/2016  . Urinary incontinence in female 09/02/2016  . Vision loss of right eye 08/26/2016  . Blindness of right eye 08/26/2016  . Inability to ambulate due to hip 10/12/2015  . Colostomy in place [facility]) 10/04/2015  . Long term current use of anticoagulant therapy 10/03/2015  . Leukocytosis 09/25/2015  . CAP (community acquired pneumonia) 09/24/2015  . COPD (chronic obstructive pulmonary disease) ([place]) 09/24/2015  . UTI (urinary tract infection) 07/16/2015  . Abdominal wall cellulitis 07/15/2015  . DVT (deep venous thrombosis) ([place]) 07/15/2015  . HTN (hypertension) 07/15/2015  . Polycythemia vera ([place]) 07/15/2015  . Arthritis 07/15/2015  . Pulmonary emboli ([place]) 05/25/2015  . Diverticulosis of colon with hemorrhage 04/02/2015  . Diverticulosis of intestine with bleeding 04/02/2015  . Fall 01/10/2015  . Osteoarthritis 01/10/2015  .  Hammertoe 09/02/2012  . S/P transmetatarsal amputation of foot ([place]) 09/02/2012  . Onychomycosis 09/02/2012  . Other specified dermatoses 09/02/2012  . S/P hip replacement 08/23/2012  . Glenohumeral arthritis 06/28/2012    Past  Surgical History:  Procedure Laterality Date  . ABDOMINAL HYSTERECTOMY    . BLEPHAROPLASTY Right 03/2017   right upper eyelid  . CARDIAC CATHETERIZATION Right 04/03/2015   Procedure: CENTRAL LINE INSERTION;  Surgeon: Sherri Rad, MD;  Location: [facility] ORS;  Service: General;  Laterality: Right;  . COLECTOMY WITH COLOSTOMY CREATION/HARTMANN PROCEDURE N/A 04/03/2015   Procedure: COLECTOMY WITH COLOSTOMY CREATION/HARTMANN PROCEDURE;  Surgeon: Sherri Rad, MD;  Location: [facility] ORS;  Service: General;  Laterality: N/A;  . COLON SURGERY    . FOOT AMPUTATION     partial  . FOOT AMPUTATION Right   . JOINT REPLACEMENT     Left and Right Hip  . PERIPHERAL VASCULAR CATHETERIZATION N/A 04/02/2015   Procedure: Visceral Angiography;  Surgeon: Algernon Huxley, MD;  Location: [facility];  Service: Cardiovascular;  Laterality: N/A;  . PERIPHERAL VASCULAR CATHETERIZATION N/A 04/02/2015   Procedure: Visceral Artery Intervention;  Surgeon: Algernon Huxley, MD;  Location: [place] [facility];  Service: Cardiovascular;  Laterality: N/A;  . PERIPHERAL VASCULAR CATHETERIZATION N/A 05/25/2015   Procedure: IVC Filter Insertion;  Surgeon: Katha Cabal, MD;  Location: [place] [facility];  Service: Cardiovascular;  Laterality: N/A;  . TOE AMPUTATION     right  . TOTAL HIP ARTHROPLASTY      Prior to Admission medications   Medication Sig Start Date End Date Taking? Authorizing Provider  amLODipine (NORVASC) 5 MG tablet Take 5 mg by mouth daily.    [provider]  aspirin EC 81 MG tablet Take 1 tablet (81 mg total) by mouth daily. 09/16/15   [name], MD  calcium-vitamin D (OSCAL WITH D) 250-125 MG-UNIT tablet Take 1 tablet by mouth daily.    [provider]  cholecalciferol (VITAMIN D) 1000 units tablet Take 1,000 Units by mouth daily.    [provider]  conjugated estrogens (PREMARIN) vaginal cream Place 1 Applicatorful vaginally daily. Apply 0.5mg  (pea-sized amount)  just  inside the vaginal introitus with a finger-tip on  Monday, Wednesday and Friday nights. Patient not taking: Reported on 08/12/2018 08/09/18   Zara Council A, PA-C  docusate sodium (COLACE) 100 MG capsule Take 100 mg by mouth 2 (two) times daily.     [provider]  ELIQUIS 5 MG TABS tablet TAKE ONE TABLET BY MOUTH TWICE A DAY 07/12/18   Lequita Asal, MD  estradiol (ESTRACE VAGINAL) 0.1 MG/GM vaginal cream Apply 0.5mg  (pea-sized amount)  just inside the vaginal introitus with a finger-tip on Monday, Wednesday and Friday nights. 08/09/18   Zara Council A, PA-C  folic acid (FOLVITE) 161 MCG tablet Take 400 mcg by mouth daily.    [provider]  ipratropium-albuterol (DUONEB) 0.5-2.5 (3) MG/3ML SOLN Take 3 mLs by nebulization every 4 (four) hours as needed.    [provider]  JAKAFI 15 MG tablet TAKE 1 TABLET (15 MG TOTAL) BY MOUTH 2 TIMES DAILY. Patient taking differently: Take 15 mg by mouth 2 (two) times daily.  07/07/18   Lequita Asal, MD  mometasone-formoterol (DULERA) 200-5 MCG/ACT AERO Inhale 1 puff into the lungs 2 (two) times daily. 08/15/18   Dustin Flock, MD  predniSONE (STERAPRED UNI-PAK 21 TAB) 10 MG (21) TBPK tablet Start at 60mg  taper by 10mg  until complete 08/15/18  Dustin Flock, MD    Allergies Patient has no known allergies.  Family History  Problem Relation Age of Onset  . Brain cancer Mother   . Heart attack Father   . COPD Father   . Coronary artery disease Father   . Hypertension Unknown   . Arthritis-Osteo Unknown   . Diabetes Brother   . Arthritis Brother   . Osteosarcoma Son   . Bone cancer Son   . Arthritis Sister   . Arthritis Brother   . Hypertension Brother   . Coronary artery disease Brother   . Malignant hyperthermia Brother     Social History Social History   Tobacco Use  . Smoking status: Never Smoker  . Smokeless tobacco: Never Used  Substance Use Topics  . Alcohol use: No  . Drug use: No     Review of Systems  Constitutional: Negative for fever. + generalized weakness and confusion Eyes: Negative for visual changes. ENT: Negative for sore throat. Neck: No neck pain  Cardiovascular: Negative for chest pain. Respiratory: Negative for shortness of breath. Gastrointestinal: Negative for abdominal pain, vomiting or diarrhea. Genitourinary: + dysuria. Musculoskeletal: Negative for back pain. Skin: Negative for rash. Neurological: Negative for headaches, weakness or numbness. Psych: No SI or HI  ____________________________________________   PHYSICAL EXAM:  VITAL SIGNS: ED Triage Vitals  Enc Vitals Group     BP 08/30/18 1430 131/71     Pulse Rate 08/30/18 1430 73     Resp 08/30/18 1430 20     Temp 08/30/18 1430 98.7 F (37.1 C)     Temp Source 08/30/18 1430 Oral     SpO2 08/30/18 1428 95 %     Weight 08/30/18 1431 202 lb (91.6 kg)     Height 08/30/18 1431 5\' 7"  (1.702 m)     Head Circumference --      Peak Flow --      Pain Score 08/30/18 1431 0     Pain Loc --      Pain Edu? --      Excl. in [place]? --     Constitutional: Alert and oriented x2, slight slurred speech, no distress.  HEENT:      Head: Normocephalic and atraumatic.         Eyes: Conjunctivae are normal. Sclera is non-icteric.       Mouth/Throat: Mucous membranes are moist.       Neck: Supple with no signs of meningismus. Cardiovascular: Regular rate and rhythm. No murmurs, gallops, or rubs. 2+ symmetrical distal pulses are present in all extremities. No JVD. Respiratory: Normal respiratory effort. Lungs are clear to auscultation bilaterally with basilar crackles.  Gastrointestinal: Soft, non tender, and non distended with positive bowel sounds. No rebound or guarding. Ostomy with green stool in it Genitourinary: No CVA tenderness. Normal external genitalia with no rash or discharge Musculoskeletal: Nontender with normal range of motion in all extremities. No edema, cyanosis, or erythema of  extremities. Neurologic: Slight slurred speech, Normal language. Face is symmetric. No pronator drift, no dysmetria, 3/5 strength on the LLE, 5/5 otherwise Skin: Skin is warm, dry and intact. No rash noted. Psychiatric: Mood and affect are normal. Speech and behavior are normal.  ____________________________________________   LABS (all labs ordered are listed, but only abnormal results are displayed)  Labs Reviewed  URINALYSIS, ROUTINE W REFLEX MICROSCOPIC - Abnormal; Notable for the following components:      Result Value   Color, Urine YELLOW (*)    APPearance CLEAR (*)  Hgb urine dipstick SMALL (*)    Leukocytes, UA SMALL (*)    All other components within normal limits  CBC WITH DIFFERENTIAL/PLATELET - Abnormal; Notable for the following components:   Hemoglobin 10.0 (*)    HCT 34.3 (*)    MCH 25.5 (*)    MCHC 29.2 (*)    RDW 31.4 (*)    nRBC 1 (*)    nRBC 1 (*)    All other components within normal limits  COMPREHENSIVE METABOLIC PANEL - Abnormal; Notable for the following components:   Calcium 8.4 (*)    Total Protein 6.4 (*)    Albumin 2.9 (*)    Anion gap 3 (*)    All other components within normal limits   ____________________________________________  EKG  ED ECG REPORT I, Rudene Re, the attending physician, personally viewed and interpreted this ECG.  Normal sinus rhythm, rate of 76, right bundle branch block, normal QTC, left axis deviation, Q waves in inferior leads, no ST elevations or depressions. Unchanged from prior ____________________________________________  RADIOLOGY  I have personally reviewed the images performed during this visit and I agree with the Radiologist's read.   Interpretation by Radiologist:  Dg Chest 2 View  Result Date: 08/30/2018 CLINICAL DATA:  84 y/o  F; possible UTI. EXAM: CHEST - 2 VIEW COMPARISON:  08/12/2018 chest radiograph FINDINGS: Stable cardiac silhouette given projection and technique. Aortic  atherosclerosis with calcification. Enlarged central pulmonary arteries may represent pulmonary artery hypertension. No focal consolidation. No pleural effusion or pneumothorax. Osteoarthrosis of the shoulder joints with osteophytosis. Bones are demineralized. Thoracic spine dextrocurvature. IMPRESSION: 1. No acute pulmonary process identified. 2. Enlarged central pulmonary arteries may represent pulmonary artery hypertension. 3.  Aortic Atherosclerosis (ICD10-I70.0). Electronically Signed   By: Kristine Garbe M.D.   On: 08/30/2018 16:37   Ct Head Wo Contrast  Result Date: 08/30/2018 CLINICAL DATA:  84 year old female with frequent UTIs. Confusion and weak. Initial encounter. EXAM: CT HEAD WITHOUT CONTRAST TECHNIQUE: Contiguous axial images were obtained from the base of the skull through the vertex without intravenous contrast. COMPARISON:  06/15/2018 head CT.  08/26/2016 brain MR. FINDINGS: Brain: No intracranial hemorrhage or CT evidence of large acute infarct. Global atrophy with prominent subarachnoid spaces unchanged. No intracranial mass lesion noted on this unenhanced exam. Expanded partially empty sella unchanged. Vascular: No hyperdense vessel.  Vascular calcifications. Skull: No acute calvarial abnormality. Sinuses/Orbits: No acute orbital abnormality. Visualized sinuses clear. Other: Mastoid air cells and middle ear cavities clear. IMPRESSION: 1. No acute intracranial abnormality. 2. Atrophy Electronically Signed   By: Genia Del M.D.   On: 08/30/2018 16:38      ____________________________________________   PROCEDURES  Procedure(s) performed: None Procedures Critical Care performed:  None ____________________________________________   INITIAL IMPRESSION / ASSESSMENT AND PLAN / ED COURSE   84 y.o. female history of COPD, PE and DVT on Eliquis, CHF, hypertension, hypokalemia, CVA with chronic left lower extremity weakness who presents for evaluation of confusion and  generalized weakness.  There is no family at the bedside at this time but patient is alert and oriented and not confused at all, able to provide me with specific details about her medical health and recent admission. She does endorse feeling weak but not confused. She is complaining of dysuria but her UA is negative, genital exam showing no rash or erythema. Patient has bibasilar crackles with normal WOB and no hypoxia. Labs, CXR, and head CT pending to eval dehydration, AKI, electrolyte abnormalities, anemia, pneumonia, or head  bleed.  Clinical Course as of Aug 30 1820  Mon Aug 30, 2018  1807 Patient's granddaughter is now at bedside and reports the patient has been more forgetful over the last 24 hours.  She endorses that the mild slurred speech and the left lower extremity weakness are chronic findings for her.  I explained to her that labs, UA, and head CT did not show any acute findings.  Granddaughter was concerned about patient's blood pressure and confusion and is requesting that patient be admitted for monitoring, patient may warrant MRI to rule out new ischemic stroke. Will ordered MRI and discuss with Hospitalist for admission   [CV]    Clinical Course User Index [CV] Alfred Levins [place], MD     As part of my medical decision making, I reviewed the following data within the [place] History obtained from family, Nursing notes reviewed and incorporated, Labs reviewed , EKG interpreted , Old EKG reviewed, Old chart reviewed, Radiograph reviewed , Notes from prior ED visits and [place] Controlled Substance Database    Pertinent labs & imaging results that were available during my care of the patient were reviewed by me and considered in my medical decision making (see chart for details).    ____________________________________________   FINAL CLINICAL IMPRESSION(S) / ED DIAGNOSES  Final diagnoses:  Confusion      NEW MEDICATIONS STARTED DURING THIS VISIT:  ED  Discharge Orders    None       Note:  This document was prepared using Dragon voice recognition software and may include unintentional dictation errors.    Alfred Levins, [place], MD 08/30/18 [place], [place], MD 08/30/18 [phone]

## 2018-08-30 NOTE — ED Notes (Addendum)
Pt taken to Chest Xray and CT scan.

## 2018-08-31 ENCOUNTER — Observation Stay: Payer: Medicare Other

## 2018-08-31 ENCOUNTER — Observation Stay
Admit: 2018-08-31 | Discharge: 2018-08-31 | Disposition: A | Discharge status: Home / Self Care | Payer: Medicare Other | Attending: Internal Medicine | Admitting: Internal Medicine

## 2018-08-31 DIAGNOSIS — R4182 Altered mental status, unspecified: Secondary | ICD-10-CM | POA: Diagnosis not present

## 2018-08-31 LAB — LIPID PANEL
CHOL/HDL RATIO: 3 ratio
CHOLESTEROL: 104 mg/dL (ref 0–200)
HDL: 35 mg/dL — AB (ref >40)
LDL Cholesterol: 64 mg/dL (ref 0–99)
TRIGLYCERIDES: 25 mg/dL (ref <150)
VLDL: 5 mg/dL (ref 0–40)

## 2018-08-31 LAB — ECHOCARDIOGRAM COMPLETE
Height: 67 in
Weight: 3232 oz

## 2018-08-31 LAB — MRSA PCR SCREENING: MRSA by PCR: NEGATIVE

## 2018-08-31 LAB — GLUCOSE, CAPILLARY: Glucose-Capillary: 82 mg/dL (ref 70–99)

## 2018-08-31 MED ORDER — ESTRADIOL 0.1 MG/GM VA CREA
1.0000 | TOPICAL_CREAM | VAGINAL | Status: DC
  Filled 2018-08-31: qty 42.5

## 2018-08-31 MED ORDER — AMLODIPINE BESYLATE 5 MG PO TABS
5.0000 mg | ORAL_TABLET | Freq: Every day | ORAL | Status: DC
  Administered 2018-08-31 – 2018-09-01 (×2): 5 mg via ORAL
  Filled 2018-08-31 (×2): qty 1

## 2018-08-31 MED ORDER — FOLIC ACID 1 MG PO TABS
500.0000 ug | ORAL_TABLET | Freq: Every day | ORAL | Status: DC
  Administered 2018-08-31 – 2018-09-01 (×2): 0.5 mg via ORAL
  Filled 2018-08-31 (×2): qty 1

## 2018-08-31 MED ORDER — IPRATROPIUM-ALBUTEROL 0.5-2.5 (3) MG/3ML IN SOLN: 3.0000 mL | RESPIRATORY_TRACT | Status: DC | PRN

## 2018-08-31 MED ORDER — METOPROLOL TARTRATE 25 MG PO TABS
25.0000 mg | ORAL_TABLET | Freq: Two times a day (BID) | ORAL | Status: DC
  Administered 2018-08-31 – 2018-09-01 (×3): 25 mg via ORAL
  Filled 2018-08-31 (×3): qty 1

## 2018-08-31 MED ORDER — SODIUM CHLORIDE 0.9 % IV SOLN
1.0000 g | INTRAVENOUS | Status: DC
  Administered 2018-08-31 – 2018-09-01 (×2): 1 g via INTRAVENOUS
  Filled 2018-08-31 (×2): qty 1
  Filled 2018-08-31: qty 10

## 2018-08-31 MED ORDER — ASPIRIN EC 81 MG PO TBEC
81.0000 mg | DELAYED_RELEASE_TABLET | Freq: Every day | ORAL | Status: DC
  Administered 2018-08-31 – 2018-09-01 (×2): 81 mg via ORAL
  Filled 2018-08-31 (×2): qty 1

## 2018-08-31 MED ORDER — APIXABAN 5 MG PO TABS
5.0000 mg | ORAL_TABLET | Freq: Two times a day (BID) | ORAL | Status: DC
  Administered 2018-08-31 – 2018-09-01 (×3): 5 mg via ORAL
  Filled 2018-08-31 (×3): qty 1

## 2018-08-31 MED ORDER — DOCUSATE SODIUM 100 MG PO CAPS
100.0000 mg | ORAL_CAPSULE | Freq: Two times a day (BID) | ORAL | Status: DC
  Administered 2018-08-31 – 2018-09-01 (×3): 100 mg via ORAL
  Filled 2018-08-31 (×3): qty 1

## 2018-08-31 MED ORDER — RUXOLITINIB PHOSPHATE 15 MG PO TABS
15.0000 mg | ORAL_TABLET | Freq: Two times a day (BID) | ORAL | Status: DC
  Administered 2018-08-31 – 2018-09-01 (×3): 15 mg via ORAL
  Filled 2018-08-31 (×4): qty 1

## 2018-08-31 MED ORDER — VITAMIN D 1000 UNITS PO TABS
1000.0000 [IU] | ORAL_TABLET | Freq: Every day | ORAL | Status: DC
  Administered 2018-08-31 – 2018-09-01 (×2): 1000 [IU] via ORAL
  Filled 2018-08-31 (×2): qty 1

## 2018-08-31 NOTE — Evaluation (Signed)
Speech Language Pathology Evaluation Patient Details Name: Yolanda Wells MRN: [id number] DOB: November 30, 1931 Today's Date: 08/31/2018 Time: 4098-1191 SLP Time Calculation (min) (ACUTE ONLY): 45 min  Problem List:  Patient Active Problem List   Diagnosis Date Noted  . AMS (altered mental status) 08/30/2018  . Pressure injury of skin 08/13/2018  . B12 deficiency 08/04/2018  . Anemia 06/07/2018  . Secondary myelofibrosis ([place]) 02/17/2018  . Goals of care, counseling/discussion 12/23/2017  . Palliative care by specialist   . DNR (do not resuscitate) discussion   . Weakness generalized   . Chronic diastolic CHF (congestive heart failure) ([place]) 07/28/2017  . Vascular dementia without behavioral disturbance ([place]) 07/08/2017  . Influenza with respiratory manifestation 12/01/2016  . Respiratory distress 11/23/2016  . Urinary incontinence in female 09/02/2016  . Vision loss of right eye 08/26/2016  . Blindness of right eye 08/26/2016  . Inability to ambulate due to hip 10/12/2015  . Colostomy in place [facility]) 10/04/2015  . Long term current use of anticoagulant therapy 10/03/2015  . Leukocytosis 09/25/2015  . CAP (community acquired pneumonia) 09/24/2015  . COPD (chronic obstructive pulmonary disease) ([place]) 09/24/2015  . UTI (urinary tract infection) 07/16/2015  . Abdominal wall cellulitis 07/15/2015  . DVT (deep venous thrombosis) ([place]) 07/15/2015  . HTN (hypertension) 07/15/2015  . Polycythemia vera ([place]) 07/15/2015  . Arthritis 07/15/2015  . Pulmonary emboli ([place]) 05/25/2015  . Diverticulosis of colon with hemorrhage 04/02/2015  . Diverticulosis of intestine with bleeding 04/02/2015  . Fall 01/10/2015  . Osteoarthritis 01/10/2015  . Hammertoe 09/02/2012  . S/P transmetatarsal amputation of foot ([place]) 09/02/2012  . Onychomycosis 09/02/2012  . Other specified dermatoses 09/02/2012  . S/P hip replacement 08/23/2012  . Glenohumeral arthritis 06/28/2012   Past Medical History:  Past  Medical History:  Diagnosis Date  . Acute deep vein thrombosis (DVT) of distal vein of left lower extremity ([place]) 10/03/2015  . Acute pulmonary embolism ([place]) 10/03/2015  . Anticoagulant long-term use   . Arthritis   . Arthritis   . CHF (congestive heart failure) ([place])   . Chronic anticoagulation 10/03/2015  . Collagen vascular disease ([place])   . Colostomy in place [facility]) 10/04/2015  . Deep venous thrombosis ([facility])    right lower extremity  . Dependent edema   . Diverticulosis of intestine with bleeding 04/02/2015  . Glenohumeral arthritis 06/28/2012  . Hammertoe 09/02/2012  . History of bilateral hip replacements 09/02/2012  . History of hysterectomy   . Hypertension   . Hypertension   . Hypokalemia   . Inability to ambulate due to hip 10/12/2015  . Mandibular dysfunction   . Onychomycosis 09/02/2012  . Osteoarthritis   . Polycythemia vera ([place])   . Presence of IVC filter 05/25/2015  . Stroke ([place]) 03/26/2015   cerebellar  . Urinary incontinence in female 09/02/2016   Past Surgical History:  Past Surgical History:  Procedure Laterality Date  . ABDOMINAL HYSTERECTOMY    . BLEPHAROPLASTY Right 03/2017   right upper eyelid  . CARDIAC CATHETERIZATION Right 04/03/2015   Procedure: CENTRAL LINE INSERTION;  Surgeon: Sherri Rad, MD;  Location: [facility] ORS;  Service: General;  Laterality: Right;  . COLECTOMY WITH COLOSTOMY CREATION/HARTMANN PROCEDURE N/A 04/03/2015   Procedure: COLECTOMY WITH COLOSTOMY CREATION/HARTMANN PROCEDURE;  Surgeon: Sherri Rad, MD;  Location: [facility] ORS;  Service: General;  Laterality: N/A;  . COLON SURGERY    . FOOT AMPUTATION     partial  . FOOT AMPUTATION Right   . JOINT REPLACEMENT     Left and  Right Hip  . PERIPHERAL VASCULAR CATHETERIZATION N/A 04/02/2015   Procedure: Visceral Angiography;  Surgeon: Algernon Huxley, MD;  Location: [facility];  Service: Cardiovascular;  Laterality: N/A;  . PERIPHERAL VASCULAR CATHETERIZATION N/A 04/02/2015   Procedure:  Visceral Artery Intervention;  Surgeon: Algernon Huxley, MD;  Location: [place] [facility];  Service: Cardiovascular;  Laterality: N/A;  . PERIPHERAL VASCULAR CATHETERIZATION N/A 05/25/2015   Procedure: IVC Filter Insertion;  Surgeon: Katha Cabal, MD;  Location: [place] [facility];  Service: Cardiovascular;  Laterality: N/A;  . TOE AMPUTATION     right  . TOTAL HIP ARTHROPLASTY     HPI:  H&P: Yolanda Wells  is a 84 y.o. female with a known history of previous CVA with chronic left-sided weakness, COPD, history of pulmonary embolism and DVT on Eliquis, chronic congestive heart failure not fluid overload, hypertension is brought into the ED by her granddaughter as patient was extremely weak on the left side and unable to move her today.  Her speech is also worse.  Initially they were concerned about a urinary tract infection but urinalysis with small leukoesterase and CT head is normal.  Patient was answering questions appropriately though she has some speech difficulties and left-sided weakness during my examination.   Assessment / Plan / Recommendation Clinical Impression  84 year old woman with baseline dysarthria and left sided weakness due to prior stroke, admitted 08/30/2018 with worsening dysarthria and left sided weakness, is presenting with mild-moderate dysarthria and word finding problems.  There is no family present to compare current intelligibility to baseline intelligibility/communication competence.  The patient is diagnosed with fever, acute cystitis with hematuria as well as acute encephalopathy and weakness.  The patient indorses fatigue/lethargy which can worsen speech intelligibility and word finding problems.  Speech will follow to determine need for further intervention for communication.    SLP Assessment  SLP Recommendation/Assessment: Patient needs continued Speech Lanaguage Pathology Services SLP Visit Diagnosis: Dysarthria and anarthria (R47.1);Cognitive communication  deficit (R41.841)    Follow Up Recommendations  Other (comment)(TBD)    Frequency and Duration min 2x/week  2 weeks      SLP Evaluation Cognition  Overall Cognitive Status: Difficult to assess Arousal/Alertness: Lethargic       Comprehension  Auditory Comprehension Overall Auditory Comprehension: Appears within functional limits for tasks assessed    Expression Expression Primary Mode of Expression: Verbal Verbal Expression Overall Verbal Expression: Appears within functional limits for tasks assessed Written Expression Dominant Hand: Right   Oral / Motor  Oral Motor/Sensory Function Overall Oral Motor/Sensory Function: Moderate impairment Motor Speech Overall Motor Speech: Impaired Respiration: Within functional limits Phonation: Normal Articulation: Impaired Level of Impairment: Sentence Intelligibility: Intelligibility reduced Phrase: 75-100% accurate Sentence: 75-100% accurate Conversation: 75-100% accurate   GO                   Leroy Sea, MS/CCC- SLP  Valetta Fuller, Susie 08/31/2018, 2:44 PM

## 2018-08-31 NOTE — Progress Notes (Signed)
OT Cancellation Note  Patient Details Name: Yolanda Wells MRN: [id number] DOB: [birth year]   Cancelled Treatment:    Reason Eval/Treat Not Completed: Patient at procedure or test/ unavailable. Order received, chart reviewed. Pt receiving echocardiogram in room. Unavailable for OT eval. Will re-attempt at later date/time as pt is available and medically appropriate.   Jeni Salles, MPH, MS, OTR/L ascom [phone] 08/31/18, 8:35 AM

## 2018-08-31 NOTE — Care Management Note (Signed)
Case Management Note  Patient Details  Name: Adanna Zuckerman MRN: [id number] Date of Birth: 01-16-1932  Subjective/Objective:  Admitted to [facility] under observation status with the diagnosis of altered mental status. Lives with granddaughter Baldo Daub [phone]) at [facility]. Last seen Dr. Janene Harvey 08/06/18. Prescriptions are filled at [facility]. Home Health is currently per [facility] for  Occupational and physical therapy. Private aide services in the home. [facility] 06/18/18. Nocturnal home  Oxygen per [facility] 08/15/18. Wheelchair, motorized wheelchair, walkers x 2, and shower chair in the home.  Self feed, needs help with baths and dressing.  Granddaughter helps with errands. No falls. Fair appetite.                 Action/Plan: Will update Cheryl with [facility].    Expected Discharge Date:                  Expected Discharge Plan:     In-House Referral:   yes  Discharge planning Services   yes  Post Acute Care Choice:    Choice offered to:     DME Arranged:    DME Agency:     HH Arranged:    HH Agency:     Status of Service:     If discussed at [employer] of Stay Meetings, dates discussed:    Additional Comments:  Shelbie Ammons, RN MSN CCM Care Management [phone] 08/31/2018, 8:49 AM

## 2018-08-31 NOTE — Progress Notes (Signed)
OT Cancellation Note  Patient Details Name: Yolanda Wells MRN: [id number] DOB: 21-Sep-1932   Cancelled Treatment:    Reason Eval/Treat Not Completed: Patient at procedure or test/ unavailable. On 2nd attempt, pt out of room for testing. Will re-attempt OT evaluation at later date/time as pt is available and medically appropriate.   Jeni Salles, MPH, MS, OTR/L ascom [phone] 08/31/18, 10:18 AM

## 2018-08-31 NOTE — Care Management Obs Status (Signed)
[place] NOTIFICATION   Patient Details  Name: Yolanda Wells MRN: [id number] Date of Birth: 06-08-1932   Medicare Observation Status Notification Given:  Yes: Explained to granddaughter and Ms. Harriet Pho, RN 08/31/2018, 9:18 AM

## 2018-08-31 NOTE — Progress Notes (Signed)
Per MD Jannifer Franklin Pt is on permissive HTN for tonight with a goal of 220/120, no orders for elevated BP were obtained.

## 2018-08-31 NOTE — Evaluation (Signed)
Occupational Therapy Evaluation Patient Details Name: Yolanda Wells MRN: [id number] DOB: 1932/03/16 Today's Date: 08/31/2018    History of Present Illness Yolanda Wells is a 84 y/o F who presented with increased weakness on L side (chronic baseline L sided weakness due to prior CVA), burning when urinating, slurred speech.  Yolanda Wells with cystitis and fever.  Stroke work-up showed an old stroke but nothing acute. Yolanda Wells's PMH includes DVT, PE, CHF, colostomy, Bil THA, R transmetatarsal amputation.     Clinical Impression   Yolanda Wells seen for OT evaluation this date. Yolanda Wells is a 84 y/o female who presented with increased weakness on L side, burning when urinating and slurred speech. Prior to admission, Yolanda Wells enjoyed spending time with her Yolanda Wells. Yolanda Wells currently demonstrates impairments with ROM, strength, activity tolerance, coordination, cognition, and vision requiring MOD/MAX assist for all ADL and IADL tasks with the exception of self feeding.  Yolanda Wells will benefit from skilled OT services to address noted impairments and functional deficits in order to maximize her independence and safety and minimize caregiver burden and falls risk.     Follow Up Recommendations  SNF    Equipment Recommendations  None recommended by OT    Recommendations for Other Services       Precautions / Restrictions Precautions Precautions: Fall Precaution Comments: monitor O2 (wears 2L nocturnal at baseline); colostomy bag Restrictions Weight Bearing Restrictions: No      Mobility Bed Mobility  Transfers                    Balance                                   ADL either performed or assessed with clinical judgement   ADL Overall ADL's : Needs assistance/impaired                                       General ADL Comments: Yolanda Wells requires MOD/MAX A with all ADL and IADL tasks with the exception of feeding which Yolanda Wells can complete independently with set up.      Vision Baseline  Vision/History: (Yolanda Wells has R eye vision loss) Additional Comments: Yolanda Wells has vision loss in R eye     Perception     Praxis      Pertinent Vitals/Pain Pain Assessment: No/denies pain     Hand Dominance Right   Extremity/Trunk Assessment Upper Extremity Assessment Upper Extremity Assessment: Generalized weakness(Yolanda Wells demonstrated grossly weak bilaterally, grip strength good, and impaired shoulder ROM bilaterally 2/2 arthritis.)    Lower extremity assessment: R transmetatarsal amputation. Strength grossly 2/5 L sided weakness due to prior CVA. Strength grossly 2-/5.       Communication Communication Communication: No difficulties   Cognition Arousal/Alertness: Awake/alert Behavior During Therapy: WFL for tasks assessed/performed Overall Cognitive Status: Impaired/Different from baseline Area of Impairment: Orientation;Following commands;Problem solving                 Orientation Level: Disoriented to;Situation;Time     Following Commands: Follows multi-step commands with increased time     Problem Solving: Slow processing;Requires verbal cues General Comments:  Required VC to follow multistep commands. Yolanda Wells demonstrated slow processing during neuro assessment.    General Comments  Yolanda Wells HR ranged from 71-79 supine in bed with head elevated.     Exercises    Shoulder Instructions  Home Living Family/patient expects to be discharged to:: Skilled nursing facility Living Arrangements: Other relatives(lives with Yolanda Wells) Available Help at Discharge: Family;Personal care attendant;Available PRN/intermittently Type of Home: Apartment Home Access: Level entry     Home Layout: One level     Bathroom Shower/Tub: Tub/shower unit         Home Equipment: Environmental consultant - 2 wheels;Wheelchair - manual;Bedside commode;Walker - 4 wheels;Wheelchair - power;Hospital bed;Shower seat;Other (comment)   Additional Comments:   Yolanda Wells has caregivers that come 2-5 hrs, 5 days/wk when  Yolanda Wells at school.      Prior Functioning/Environment Level of Independence: Needs assistance    ADL's / Homemaking Assistance Needed: Assist needed with lower body dressing, sponge bath in bed.  Yolanda Wells does the cooking and cleaning.  [place] transportation for transportation which comes to the house.     Comments: Yolanda Wells noted "transfers to manual wheelchair using stand pivot"         OT Problem List: Decreased strength;Decreased range of motion;Decreased activity tolerance;Impaired vision/perception;Decreased knowledge of use of DME or AE      OT Treatment/Interventions: Self-care/ADL training;DME and/or AE instruction;Energy conservation;Therapeutic exercise;Therapeutic activities;Patient/family education    OT Goals(Current goals can be found in the care plan section) Acute Rehab OT Goals Patient Stated Goal: to get stronger OT Goal Formulation: With patient Time For Goal Achievement: 09/14/18 Potential to Achieve Goals: Good ADL Goals Additional ADL Goal #1: Yolanda Wells will utilize 2 -3 energy conservation techniques while assisting with her ADL tasks. Additional ADL Goal #2: In preparation for ADL tasks, Yolanda Wells will perform supine <> sit EOB with MOD x2 assist.  OT Frequency: Min 2X/week   Barriers to D/C:            Co-evaluation              AM-PAC Yolanda Wells "6 Clicks" Daily Activity     Outcome Measure Help from another person eating meals?: None Help from another person taking care of personal grooming?: A Lot Help from another person toileting, which includes using toliet, bedpan, or urinal?: A Lot Help from another person bathing (including washing, rinsing, drying)?: A Lot Help from another person to put on and taking off regular upper body clothing?: A Lot Help from another person to put on and taking off regular lower body clothing?: A Lot 6 Click Score: 14   End of Session    Activity Tolerance: Patient tolerated treatment well Patient left: in bed;with  call bell/phone within reach;with bed alarm set  OT Visit Diagnosis: Other abnormalities of gait and mobility (R26.89);Muscle weakness (generalized) (M62.81)                Time: 2500-3704 OT Time Calculation (min): 17 min Charges:     Yolanda Wells OTS  08/31/2018, 3:51 PM

## 2018-08-31 NOTE — Evaluation (Signed)
Physical Therapy Evaluation Patient Details Name: Yolanda Wells MRN: [id number] DOB: [birth year] Today's Date: 08/31/2018   History of Present Illness  Pt is a 84 y/o F who presented with increased weakness on L side (chronic baseline L sided weakness due to prior CVA), burning when urinating, slurred speech.  Pt with cystitis and fever.  Stroke work-up showed an old stroke but nothing acute. Pt's PMH includes DVT, PE, CHF, colostomy, Bil THA, R transmetatarsal amputation.      Clinical Impression  Pt admitted with above diagnosis. Pt currently with functional limitations due to the deficits listed below (see PT Problem List). Pt presents with decreased strength compared to her baseline level of mobility.  At baseline she requires assist with all ADLs and is limited to stand pivot transfers with RW (not ambulating at baseline) due to weakness.  Pt has colostomy bag and voids in her diaper at baseline, placing her at increased risk for recurrent UTI and/or skin breakdown if not attended to promptly.  The pt is alone for several hours each day when granddaughter away at work.  Pt currently requires max +2 assist for supine<>sit, thus it was not safe to attempt OOB activity at this time.  Given pt's current mobility status, recommending SNF at d/c.   Pt will benefit from skilled PT to increase their independence and safety with mobility to allow discharge to the venue listed below.      Follow Up Recommendations SNF    Equipment Recommendations  Other (comment)(TBD at next venue of care)    Recommendations for Other Services OT consult     Precautions / Restrictions Precautions Precautions: Fall;Other (comment) Precaution Comments: monitor O2 (wears 2L nocturnal at baseline); colostomy bag Restrictions Weight Bearing Restrictions: No      Mobility  Bed Mobility Overal bed mobility: Needs Assistance Bed Mobility: Supine to Sit;Sit to Supine     Supine to sit: Max assist;+2 for physical  assistance;HOB elevated Sit to supine: Max assist;+2 for physical assistance   General bed mobility comments: Pt does initiate supine>sit but advancing LEs but past this requires max +2 assist to achieve sitting EOB and to return from sitting to supine.    Transfers                 General transfer comment: Not safe to attempt this session due to weakness (max +2 assist for bed mobility) and pt reporting mild dizziness sitting EOB (resolved once returned to supine)   Ambulation/Gait                Stairs            Wheelchair Mobility    Modified Rankin (Stroke Patients Only)       Balance Overall balance assessment: Needs assistance Sitting-balance support: Single extremity supported;Feet supported Sitting balance-Leahy Scale: Poor Sitting balance - Comments: Relies on at least 1UE support while sitting EOB                                     Pertinent Vitals/Pain Pain Assessment: No/denies pain    Home Living Family/patient expects to be discharged to:: Skilled nursing facility Living Arrangements: Other relatives(lives with granddaughter) Available Help at Discharge: Family;Personal care attendant;Available PRN/intermittently(pt alone for several hours a day) Type of Home: Apartment Home Access: Level entry     Home Layout: One level Home Equipment: Walker - 2 wheels;Wheelchair - manual;Bedside commode;Walker -  4 wheels;Wheelchair - power;Hospital bed;Shower seat;Other (comment)(lift chair) Additional Comments: Granddaughter works 12 hour shifts as NT at [employer] and is gone during the day.  Pt has caregivers that come 2-5 hrs, 5 days/wk.      Prior Function Level of Independence: Needs assistance   Gait / Transfers Assistance Needed: Performs stand pivot transfers with RW and assist/min guard at times. No falls in the past 6 months.    ADL's / Homemaking Assistance Needed: Assist needed with lower body dressing, sponge bath in bed.   Granddaughter does the cooking and cleaning.  Yolanda Wells transportation for transportation which comes to the house.          Hand Dominance   Dominant Hand: Right    Extremity/Trunk Assessment   Upper Extremity Assessment Upper Extremity Assessment: Defer to OT evaluation    Lower Extremity Assessment Lower Extremity Assessment: RLE deficits/detail;LLE deficits/detail RLE Deficits / Details: h/o R transmetatarsal amputation.  Strength grossly 2/5.   LLE Deficits / Details: H/o L sided weakness due to prior CVA.  Strength grossly 2-/5.         Communication   Communication: (slurred speech (granddaughter says > than baseline))  Cognition Arousal/Alertness: Lethargic Behavior During Therapy: WFL for tasks assessed/performed Overall Cognitive Status: Impaired/Different from baseline                                 General Comments: Granddaughter reports pt slightly confused past her baseline      General Comments General comments (skin integrity, edema, etc.): RN doffed [place] on 2L to RA at start of session.  SpO2 remained at or above 93% on RA throughout session.     Exercises General Exercises - Upper Extremity Shoulder Flexion: AROM;Both;10 reps;Supine Shoulder Extension: AROM;Both;10 reps;Supine Elbow Flexion: AROM;Both;10 reps;Supine Elbow Extension: AROM;Both;10 reps;Supine Digit Composite Flexion: AROM;Both;10 reps;Supine Composite Extension: AROM;Both;10 reps;Supine General Exercises - Lower Extremity Ankle Circles/Pumps: AROM;Both;10 reps;Supine Heel Slides: AAROM;Both;10 reps;Supine Hip ABduction/ADduction: AAROM;Both;10 reps;Supine Straight Leg Raises: AAROM;Both;10 reps;Supine Other Exercises Other Exercises: Attempted lateral scooting in bed but pt remains unable to perform despite verbal and tactile cues provided for weight shifting technique   Assessment/Plan    PT Assessment Patient needs continued PT services  PT Problem List Decreased  strength;Decreased range of motion;Decreased activity tolerance;Decreased balance;Decreased mobility;Decreased cognition;Decreased coordination;Decreased knowledge of use of DME;Decreased safety awareness;Obesity       PT Treatment Interventions DME instruction;Gait training;Functional mobility training;Therapeutic activities;Therapeutic exercise;Balance training;Neuromuscular re-education;Cognitive remediation;Patient/family education;Wheelchair mobility training    PT Goals (Current goals can be found in the Care Plan section)  Acute Rehab PT Goals Patient Stated Goal: to get stronger PT Goal Formulation: With patient Time For Goal Achievement: 09/14/18 Potential to Achieve Goals: Fair    Frequency Min 2X/week   Barriers to discharge Decreased caregiver support Pt alone for several hours of the day    Co-evaluation               AM-PAC PT "6 Clicks" Daily Activity  Outcome Measure Difficulty turning over in bed (including adjusting bedclothes, sheets and blankets)?: Unable Difficulty moving from lying on back to sitting on the side of the bed? : Unable Difficulty sitting down on and standing up from a chair with arms (e.g., wheelchair, bedside commode, etc,.)?: Unable Help needed moving to and from a bed to chair (including a wheelchair)?: Total Help needed walking in hospital room?: Total Help needed climbing 3-5 steps with a  railing? : Total 6 Click Score: 6    End of Session   Activity Tolerance: Patient limited by fatigue Patient left: in bed;with call bell/phone within reach;with bed alarm set Nurse Communication: Mobility status PT Visit Diagnosis: Muscle weakness (generalized) (M62.81);Unsteadiness on feet (R26.81)    Time: 5041-3643 PT Time Calculation (min) (ACUTE ONLY): 49 min   Charges:   PT Evaluation $PT Eval Moderate Complexity: 1 Mod PT Treatments $Therapeutic Exercise: 8-22 mins $Therapeutic Activity: 8-22 mins        Collie Siad PT,  DPT 08/31/2018, 1:12 PM

## 2018-08-31 NOTE — NC FL2 (Signed)
[place] LEVEL OF CARE SCREENING TOOL     IDENTIFICATION  Patient Name: Yolanda Wells Birthdate: 1932-06-30 Sex: female Admission Date (Current Location): 08/30/2018  [place] and [place] Number:  Engineering geologist and Address:  [facility], [address] [place], [place] [address]      Provider Number: [id number]  Attending Physician Name and Address:  Loletha Grayer, MD  Relative Name and Phone Number:  [name]- granddaughter [phone]    Current Level of Care: Hospital Recommended Level of Care: [place] Prior Approval Number:    Date Approved/Denied:   PASRR Number: [id number]  Discharge Plan: SNF    Current Diagnoses: Patient Active Problem List   Diagnosis Date Noted  . AMS (altered mental status) 08/30/2018  . Pressure injury of skin 08/13/2018  . B12 deficiency 08/04/2018  . Anemia 06/07/2018  . 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 Goals of care, counseling/discussion 12/23/2017  . Palliative care by specialist   . DNR (do not resuscitate) discussion   . Weakness generalized   . Chronic diastolic CHF (congestive heart failure) ([place]) 07/28/2017  . Vascular dementia without behavioral disturbance ([place]) 07/08/2017  . Influenza with respiratory manifestation 12/01/2016  . Respiratory distress 11/23/2016  . Urinary incontinence in female 09/02/2016  . Vision loss of right eye 08/26/2016  . Blindness of right eye 08/26/2016  . Inability to ambulate due to hip 10/12/2015  . Colostomy in place [facility]) 10/04/2015  . Long term current use of anticoagulant therapy 10/03/2015  . Leukocytosis 09/25/2015  . CAP (community acquired pneumonia) 09/24/2015  . COPD (chronic obstructive pulmonary disease) ([place]) 09/24/2015  . UTI (urinary tract infection) 07/16/2015  . Abdominal wall cellulitis 07/15/2015  . DVT (deep venous thrombosis) ([place]) 07/15/2015  . HTN (hypertension) 07/15/2015  .  Polycythemia vera ([place]) 07/15/2015  . Arthritis 07/15/2015  . Pulmonary emboli ([place]) 05/25/2015  . Diverticulosis of colon with hemorrhage 04/02/2015  . Diverticulosis of intestine with bleeding 04/02/2015  . Fall 01/10/2015  . Osteoarthritis 01/10/2015  . Hammertoe 09/02/2012  . S/P transmetatarsal amputation of foot ([place]) 09/02/2012  . Onychomycosis 09/02/2012  . Other specified dermatoses 09/02/2012  . S/P hip replacement 08/23/2012  . Glenohumeral arthritis 06/28/2012    Orientation RESPIRATION BLADDER Height & Weight     Self, Place  Normal Incontinent Weight: 202 lb 8 oz (91.9 kg) Height:  5\' 7"  (170.2 cm)  BEHAVIORAL SYMPTOMS/MOOD NEUROLOGICAL BOWEL NUTRITION STATUS  (none) (none) Colostomy Diet(Heart Healthy )  AMBULATORY STATUS COMMUNICATION OF NEEDS Skin   Extensive Assist Verbally Normal                       Personal Care Assistance Level of Assistance  Bathing, Feeding, Dressing Bathing Assistance: Limited assistance Feeding assistance: Independent Dressing Assistance: Limited assistance     Functional Limitations Info  Sight, Hearing, Speech Sight Info: Adequate Hearing Info: Adequate Speech Info: Adequate    SPECIAL CARE FACTORS FREQUENCY  PT (By licensed PT), OT (By licensed OT)     PT Frequency: 5 OT Frequency: 5            Contractures Contractures Info: Not present    Additional Factors Info  Code Status, Allergies Code Status Info: DNR Allergies Info: NKA           Current Medications (08/31/2018):  This is the current hospital active medication list Current Facility-Administered Medications  Medication Dose Route Frequency Provider Last Rate Last Dose  .  acetaminophen (TYLENOL) tablet 650 mg  650 mg Oral Q4H PRN Loletha Grayer, MD   650 mg at 08/31/18 9449   Or  . acetaminophen (TYLENOL) solution 650 mg  650 mg Per Tube Q4H PRN Loletha Grayer, MD       Or  . acetaminophen (TYLENOL) suppository 650 mg  650 mg Rectal Q4H  PRN Wieting, Richard, MD      . amLODipine (NORVASC) tablet 5 mg  5 mg Oral Daily Loletha Grayer, MD   5 mg at 08/31/18 1216  . apixaban (ELIQUIS) tablet 5 mg  5 mg Oral BID Loletha Grayer, MD   5 mg at 08/31/18 1216  . aspirin EC tablet 81 mg  81 mg Oral Daily Loletha Grayer, MD   81 mg at 08/31/18 1216  . cefTRIAXone (ROCEPHIN) 1 g in sodium chloride 0.9 % 100 mL IVPB  1 g Intravenous Q24H Wieting, Richard, MD      . cholecalciferol (VITAMIN D) tablet 1,000 Units  1,000 Units Oral Daily Loletha Grayer, MD   1,000 Units at 08/31/18 1216  . docusate sodium (COLACE) capsule 100 mg  100 mg Oral BID Loletha Grayer, MD   100 mg at 08/31/18 1217  . [START ON 09/01/2018] estradiol (ESTRACE) vaginal cream 1 Applicatorful  1 Applicatorful Vaginal Once per day on Mon Wed Fri Glandorf, Richard, MD      . folic acid Darnelle Catalan) tablet 0.5 mg  500 mcg Oral Daily Loletha Grayer, MD   0.5 mg at 08/31/18 1217  . ipratropium-albuterol (DUONEB) 0.5-2.5 (3) MG/3ML nebulizer solution 3 mL  3 mL Nebulization Q4H PRN Wieting, Richard, MD      . metoprolol tartrate (LOPRESSOR) tablet 25 mg  25 mg Oral BID Loletha Grayer, MD   25 mg at 08/31/18 1533  . ruxolitinib phosphate (JAKAFI) tablet 15 mg  15 mg Oral BID Loletha Grayer, MD   15 mg at 08/31/18 1533     Discharge Medications: Please see discharge summary for a list of discharge medications.  Relevant Imaging Results:  Relevant Lab Results:   Additional Information SSN: [id number]  Annamaria Boots, [place]

## 2018-08-31 NOTE — Progress Notes (Signed)
Pt was able to pass the swallow screen test. Ate a sandwich with no problem. MD Jannifer Franklin made aware and diet was changed by MD.

## 2018-08-31 NOTE — Progress Notes (Signed)
Patient ID: Yolanda Wells, female   DOB: [birth year], 84 y.o.   MRN: [id number]  [employer] PROGRESS NOTE  [name] ATF:[id number] DOB: Sep 06, 1932 DOA: 08/30/2018 PCP: [name], MD  HPI/Subjective: Patient brought in with weakness and some slurred speech.  Her normal temperature is 96 as per the granddaughter at the bedside.  She is having a temperature of 100.5.  Patient able to follow commands.  She has baseline left-sided weakness from prior stroke.  Objective: Vitals:   08/30/18 2334 08/31/18 0352  BP: (!) 187/92 (!) 170/89  Pulse: 93 96  Resp:  17  Temp: (!) 100.5 F (38.1 C) (!) 100.4 F (38 C)  SpO2: 100% 96%    Filed Weights   08/30/18 1431 08/30/18 2020  Weight: 91.6 kg 91.9 kg    ROS: Review of Systems  Constitutional: Positive for fever. Negative for chills.  Eyes: Negative for blurred vision.  Respiratory: Negative for cough and shortness of breath.   Cardiovascular: Negative for chest pain.  Gastrointestinal: Negative for abdominal pain, constipation, diarrhea, nausea and vomiting.  Genitourinary: Positive for dysuria.  Musculoskeletal: Negative for joint pain.  Neurological: Negative for dizziness and headaches.   Exam: Physical Exam  HENT:  Nose: No mucosal edema.  Mouth/Throat: No oropharyngeal exudate or posterior oropharyngeal edema.  Eyes: Conjunctivae, EOM and lids are normal.  Neck: No JVD present. Carotid bruit is not present. No edema present. No thyroid mass and no thyromegaly present.  Cardiovascular: S1 normal and S2 normal. Exam reveals no gallop.  No murmur heard. Pulses:      Dorsalis pedis pulses are 2+ on the right side, and 2+ on the left side.  Respiratory: No respiratory distress. She has no wheezes. She has no rhonchi. She has no rales.  GI: Soft. Bowel sounds are normal. There is no tenderness.  Musculoskeletal:       Right ankle: She exhibits swelling.       Left ankle: She exhibits swelling.  Lymphadenopathy:     She has no cervical adenopathy.  Neurological: She is alert. No cranial nerve deficit.  Patient barely able to straight leg raise with her left leg.  Does better with the right leg.  Skin: Skin is warm. No rash noted. Nails show no clubbing.  Psychiatric: She has a normal mood and affect.      Data Reviewed: Basic Metabolic Panel: Recent Labs  Lab 08/30/18 1600  NA 138  K 3.9  CL 104  CO2 31  GLUCOSE 96  BUN 10  CREATININE 0.52  CALCIUM 8.4*   Liver Function Tests: Recent Labs  Lab 08/30/18 1600  AST 17  ALT 11  ALKPHOS 61  BILITOT 0.7  PROT 6.4*  ALBUMIN 2.9*   CBC: Recent Labs  Lab 08/30/18 1600  WBC 9.3  NEUTROABS 7.7  HGB 10.0*  HCT 34.3*  MCV 87.5  PLT 238   BNP (last 3 results) Recent Labs    04/06/18 1616 05/17/18 2036 06/21/18 0500  BNP 39.0 74.0 30.0    CBG: Recent Labs  Lab 08/30/18 2017  GLUCAP 82    Recent Results (from the past 240 hour(s))  MRSA PCR Screening     Status: None   Collection Time: 08/31/18  4:44 AM  Result Value Ref Range Status   MRSA by PCR NEGATIVE NEGATIVE Final    Comment:        The GeneXpert MRSA Assay (FDA approved for NASAL specimens only), is one component of a comprehensive MRSA colonization  surveillance program. It is not intended to diagnose MRSA infection nor to guide or monitor treatment for MRSA infections. Performed at [facility], [address] [place], [place] [address]      Studies: Dg Chest 2 View  Result Date: 08/30/2018 CLINICAL DATA:  84 y/o  F; possible UTI. EXAM: CHEST - 2 VIEW COMPARISON:  08/12/2018 chest radiograph FINDINGS: Stable cardiac silhouette given projection and technique. Aortic atherosclerosis with calcification. Enlarged central pulmonary arteries may represent pulmonary artery hypertension. No focal consolidation. No pleural effusion or pneumothorax. Osteoarthrosis of the shoulder joints with osteophytosis. Bones are demineralized. Thoracic spine  dextrocurvature. IMPRESSION: 1. No acute pulmonary process identified. 2. Enlarged central pulmonary arteries may represent pulmonary artery hypertension. 3.  Aortic Atherosclerosis (ICD10-I70.0). Electronically Signed   By: Kristine Garbe M.D.   On: 08/30/2018 16:37   Ct Head Wo Contrast  Result Date: 08/30/2018 CLINICAL DATA:  84 year old female with frequent UTIs. Confusion and weak. Initial encounter. EXAM: CT HEAD WITHOUT CONTRAST TECHNIQUE: Contiguous axial images were obtained from the base of the skull through the vertex without intravenous contrast. COMPARISON:  06/15/2018 head CT.  08/26/2016 brain MR. FINDINGS: Brain: No intracranial hemorrhage or CT evidence of large acute infarct. Global atrophy with prominent subarachnoid spaces unchanged. No intracranial mass lesion noted on this unenhanced exam. Expanded partially empty sella unchanged. Vascular: No hyperdense vessel.  Vascular calcifications. Skull: No acute calvarial abnormality. Sinuses/Orbits: No acute orbital abnormality. Visualized sinuses clear. Other: Mastoid air cells and middle ear cavities clear. IMPRESSION: 1. No acute intracranial abnormality. 2. Atrophy Electronically Signed   By: Genia Del M.D.   On: 08/30/2018 16:38   Mr Brain Wo Contrast  Result Date: 08/31/2018 CLINICAL DATA:  TIA EXAM: MRI HEAD WITHOUT CONTRAST MRA HEAD WITHOUT CONTRAST TECHNIQUE: Multiplanar, multiecho pulse sequences of the brain and surrounding structures were obtained without intravenous contrast. Angiographic images of the head were obtained using MRA technique without contrast. COMPARISON:  Head CT 08/30/2018 FINDINGS: MRI HEAD FINDINGS BRAIN: There is no acute infarct, acute hemorrhage or mass effect. The midline structures are normal. There are no old infarcts. Multifocal white matter hyperintensity, most commonly due to chronic ischemic microangiopathy. The CSF spaces are normal for age, with no hydrocephalus.  Susceptibility-sensitive sequences show no chronic microhemorrhage or superficial siderosis. SKULL AND UPPER CERVICAL SPINE: The visualized skull base, calvarium, upper cervical spine and extracranial soft tissues are normal. SINUSES/ORBITS: No fluid levels or advanced mucosal thickening. No mastoid or middle ear effusion. The orbits are normal. MRA HEAD FINDINGS Intracranial internal carotid arteries: Normal. Anterior cerebral arteries: Normal. Middle cerebral arteries: Normal. Posterior communicating arteries: Present only on the left. Posterior cerebral arteries: Normal. Basilar artery: Normal. Vertebral arteries: Left dominant. Normal. Superior cerebellar arteries: Normal. Inferior cerebellar arteries: Normal. IMPRESSION: 1. No acute intracranial abnormality. 2. No emergent large vessel occlusion or hemodynamically significant stenosis of the intracranial arteries. 3. Chronic small vessel ischemia. Electronically Signed   By: Ulyses Jarred M.D.   On: 08/31/2018 02:02   US Carotid Bilateral (at [facility] And [facility] Only)  Result Date: 08/31/2018 CLINICAL DATA:  84 year old female with a history TIA EXAM: BILATERAL CAROTID DUPLEX ULTRASOUND TECHNIQUE: Pearline Cables scale imaging, color Doppler and duplex ultrasound were performed of bilateral carotid and vertebral arteries in the neck. COMPARISON:  08/26/2016, 05/25/2015 FINDINGS: Criteria: Quantification of carotid stenosis is based on velocity parameters that correlate the residual internal carotid diameter with NASCET-based stenosis levels, using the diameter of the distal internal carotid lumen as the denominator  for stenosis measurement. The following velocity measurements were obtained: RIGHT ICA:  Systolic 914 cm/sec, Diastolic 21 cm/sec CCA:  93 cm/sec SYSTOLIC ICA/CCA RATIO:  1.1 ECA:  55 cm/sec LEFT ICA:  Systolic 87 cm/sec, Diastolic 21 cm/sec CCA:  79 cm/sec SYSTOLIC ICA/CCA RATIO:  1.1 ECA:  66 cm/sec Right Brachial SBP: Not acquired Left Brachial SBP: Not  acquired RIGHT CAROTID ARTERY: No significant calcified disease of the right common carotid artery. Intermediate waveform maintained. Heterogeneous plaque without significant calcifications at the right carotid bifurcation. Low resistance waveform of the right ICA. No significant tortuosity. RIGHT VERTEBRAL ARTERY: Antegrade flow with low resistance waveform. LEFT CAROTID ARTERY: No significant calcified disease of the left common carotid artery. Intermediate waveform maintained. Heterogeneous plaque at the left carotid bifurcation without significant calcifications. Low resistance waveform of the left ICA. LEFT VERTEBRAL ARTERY:  Antegrade flow with low resistance waveform. IMPRESSION: Color duplex indicates minimal heterogeneous plaque, with no hemodynamically significant stenosis by duplex criteria in the extracranial cerebrovascular circulation. Signed, Dulcy Fanny. Dellia Nims, RPVI [facility] Radiology Electronically Signed   By: Corrie Mckusick D.O.   On: 08/31/2018 09:57   Mr Jodene Nam Head/brain NW Cm  Result Date: 08/31/2018 CLINICAL DATA:  TIA EXAM: MRI HEAD WITHOUT CONTRAST MRA HEAD WITHOUT CONTRAST TECHNIQUE: Multiplanar, multiecho pulse sequences of the brain and surrounding structures were obtained without intravenous contrast. Angiographic images of the head were obtained using MRA technique without contrast. COMPARISON:  Head CT 08/30/2018 FINDINGS: MRI HEAD FINDINGS BRAIN: There is no acute infarct, acute hemorrhage or mass effect. The midline structures are normal. There are no old infarcts. Multifocal white matter hyperintensity, most commonly due to chronic ischemic microangiopathy. The CSF spaces are normal for age, with no hydrocephalus. Susceptibility-sensitive sequences show no chronic microhemorrhage or superficial siderosis. SKULL AND UPPER CERVICAL SPINE: The visualized skull base, calvarium, upper cervical spine and extracranial soft tissues are  normal. SINUSES/ORBITS: No fluid levels or advanced mucosal thickening. No mastoid or middle ear effusion. The orbits are normal. MRA HEAD FINDINGS Intracranial internal carotid arteries: Normal. Anterior cerebral arteries: Normal. Middle cerebral arteries: Normal. Posterior communicating arteries: Present only on the left. Posterior cerebral arteries: Normal. Basilar artery: Normal. Vertebral arteries: Left dominant. Normal. Superior cerebellar arteries: Normal. Inferior cerebellar arteries: Normal. IMPRESSION: 1. No acute intracranial abnormality. 2. No emergent large vessel occlusion or hemodynamically significant stenosis of the intracranial arteries. 3. Chronic small vessel ischemia. Electronically Signed   By: Ulyses Jarred M.D.   On: 08/31/2018 02:02    Scheduled Meds: . amLODipine  5 mg Oral Daily  . apixaban  5 mg Oral BID  . aspirin EC  81 mg Oral Daily  . cholecalciferol  1,000 Units Oral Daily  . docusate sodium  100 mg Oral BID  . [START ON 09/01/2018] estradiol  1 Applicatorful Vaginal Once per day on Mon Wed Fri  . folic acid  295 mcg Oral Daily  . ruxolitinib phosphate  15 mg Oral BID   Continuous Infusions: . cefTRIAXone (ROCEPHIN)  IV      Assessment/Plan:  1. Fever, acute cystitis with hematuria.  Start Rocephin and send off urine culture.  Watch again overnight. 2. Acute encephalopathy and weakness.  Stroke work-up showed an old stroke but nothing acute.  Continue aspirin and Eliquis. 3. History of DVT and PE on Eliquis 4. Hypertension.  Restart Norvasc and monitor 5. Previous stroke with chronic left-sided weakness 6. Polycythemia vera on [place]  Code Status:  Code Status Orders  (From admission, onward)         Start     Ordered   08/30/18 2019  Do not attempt resuscitation (DNR)  Continuous    Question Answer Comment  In the event of cardiac or respiratory ARREST Do not call a "code blue"   In the event of cardiac or respiratory ARREST Do not perform  Intubation, CPR, defibrillation or ACLS   In the event of cardiac or respiratory ARREST Use medication by any route, position, wound care, and other measures to relive pain and suffering. May use oxygen, suction and manual treatment of airway obstruction as needed for comfort.   Comments RN may pronounce      08/30/18 2018        Code Status History    Date Active Date Inactive Code Status Order ID Comments User Context   08/30/2018 1903 08/30/2018 2018 Full Code [id number]  Nicholes Mango, MD ED   08/13/2018 1038 08/15/2018 1836 Full Code [id number]  Loletha Grayer, MD Inpatient   08/12/2018 2001 08/13/2018 1038 DNR [id number]  Gorden Harms, MD ED   08/12/2018 2001 08/12/2018 2001 Full Code [id number]  Salary, Avel Peace, MD ED   06/15/2018 1635 06/18/2018 2009 Full Code [id number]  [name], MD Inpatient   02/24/2018 0347 02/27/2018 0027 Full Code [id number]  Amelia Jo, MD Inpatient   10/15/2017 1141 10/16/2017 2204 Full Code [id number]  Knox Royalty, NP Inpatient   10/13/2017 2246 10/15/2017 1141 DNR [id number]  Lance Coon, MD ED   10/06/2017 1454 10/09/2017 2058 DNR [id number]  Nicholes Mango, MD Inpatient   10/06/2017 1111 10/06/2017 1453 Full Code [id number]  Nicholes Mango, MD Inpatient   05/27/2017 1954 05/28/2017 2013 Full Code [id number]  Vaughan Basta, MD Inpatient   05/23/2017 2322 05/26/2017 1614 Full Code [id number]  Lance Coon, MD Inpatient   04/10/2017 0325 04/11/2017 1916 Full Code [id number]  Harrie Foreman, MD Inpatient   11/23/2016 0629 12/01/2016 1935 Full Code [id number]  Harrie Foreman, MD Inpatient   08/26/2016 0445 08/26/2016 1923 Full Code [id number]  Saundra Shelling, MD Inpatient   09/22/2015 1755 09/25/2015 1844 Full Code [id number]  Demetrios Loll, MD ED   09/15/2015 0341 09/17/2015 1741 Full Code [id number]  Harrie Foreman, MD Inpatient   07/16/2015 0151 07/20/2015 2334 Full Code [id number]  Lance Coon, MD Inpatient   05/25/2015 0834 05/29/2015 1616 Full Code [id number]   Harrie Foreman, MD Inpatient   04/02/2015 0606 04/08/2015 1651 Full Code [id number]  Harrie Foreman, MD Inpatient    Advance Directive Documentation     Most Recent Value  Type of Advance Directive  Out of facility DNR (pink MOST or yellow form), Healthcare Power of Attorney  Pre-existing out of facility DNR order (yellow form or pink MOST form)  Pink MOST form placed in chart (order not valid for inpatient use)  "MOST" Form in Place?  -     Family Communication: Spoke with granddaughter at the bedside Disposition Plan: Since having temperature will watch overnight again  Antibiotics: -Rocephin  Time spent: 30 minutes  [place]

## 2018-08-31 NOTE — Plan of Care (Signed)
Pt had 13 beat run of atrial tach in the 160s.  Informed Dr. Leslye Peer verbally.  He put in new order of metoprolol,.

## 2018-08-31 NOTE — Progress Notes (Signed)
*  PRELIMINARY RESULTS* Echocardiogram 2D Echocardiogram has been performed.  Yolanda Wells 08/31/2018, 8:59 AM

## 2018-09-01 ENCOUNTER — Ambulatory Visit: Payer: Medicare Other | Admitting: Urgent Care

## 2018-09-01 ENCOUNTER — Other Ambulatory Visit: Payer: Medicare Other

## 2018-09-01 DIAGNOSIS — R4182 Altered mental status, unspecified: Secondary | ICD-10-CM | POA: Diagnosis not present

## 2018-09-01 LAB — HEMOGLOBIN A1C
HEMOGLOBIN A1C: 5.6 % (ref 4.8–5.6)
MEAN PLASMA GLUCOSE: 114 mg/dL

## 2018-09-01 MED ORDER — CEPHALEXIN 250 MG PO CAPS: 250.0000 mg | ORAL_CAPSULE | Freq: Two times a day (BID) | ORAL | 0 refills | Status: AC

## 2018-09-01 MED ORDER — METOPROLOL TARTRATE 25 MG PO TABS: 12.5000 mg | ORAL_TABLET | Freq: Two times a day (BID) | ORAL | 0 refills | Status: DC

## 2018-09-01 NOTE — Progress Notes (Addendum)
New referral for outpatient Palliative to follow at home received from [facility]. Plan is for discharge today. [place] will resume services with [facility] home health. Patient information faxed to referral. Flo Shanks RN, BSN, [place] of [facility], [facility] [phone]

## 2018-09-01 NOTE — Discharge Instructions (Signed)

## 2018-09-01 NOTE — Progress Notes (Signed)
Pt granddaughter called she has been provided with plan of care update. [place] daughter currently speaking to [place], care managment,  concerning out patient palliative care planning.

## 2018-09-01 NOTE — Care Management Note (Signed)
Case Management Note  Patient Details  Name: Yolanda Wells MRN: [id number] Date of Birth: 02-12-1932  Subjective/Objective:  Patient to be discharged per MD order. Orders in place for home health services. Patient currently open to [facility]. Spoke with grand daughter and she wished to resume with [facility]. Resumption referral confirmed with [name] who agrees to restart services.No DME needs. EMS will provide transport.                     Action/Plan:   Expected Discharge Date:  09/01/18               Expected Discharge Plan:  [place]  In-House Referral:     Discharge planning Services  CM Consult  Post Acute Care Choice:  Home Health, Resumption of Svcs/PTA Provider Choice offered to:  Patient, [facility] POA / Guardian  DME Arranged:    DME Agency:     HH Arranged:  RN, PT, Nurse's Aide [place] Agency:  [place]  Status of Service:  In process, will continue to follow  If discussed at Long Length of Stay Meetings, dates discussed:    Additional Comments:  Latanya Maudlin, RN 09/01/2018, 10:35 AM

## 2018-09-03 LAB — URINE CULTURE
Culture: 40000 — AB
SPECIAL REQUESTS: NORMAL

## 2018-09-03 NOTE — Discharge Summary (Signed)
[facility] [facility] NAME: Yolanda Wells    MR#:  [id number]  DATE OF BIRTH:  December 25, 1931  DATE OF ADMISSION:  08/30/2018   ADMITTING PHYSICIAN: Nicholes Mango, MD  DATE OF DISCHARGE: 09/01/2018  4:59 PM  PRIMARY CARE PHYSICIAN: [name], MD   ADMISSION DIAGNOSIS:  TIA (transient ischemic attack) [G45.9] Confusion [R41.0] DISCHARGE DIAGNOSIS:  Active Problems:   AMS (altered mental status) TIA ruled out SECONDARY DIAGNOSIS:   Past Medical History:  Diagnosis Date  . Acute deep vein thrombosis (DVT) of distal vein of left lower extremity ([place]) 10/03/2015  . Acute pulmonary embolism ([place]) 10/03/2015  . Anticoagulant long-term use   . Arthritis   . Arthritis   . CHF (congestive heart failure) ([place])   . Chronic anticoagulation 10/03/2015  . Collagen vascular disease ([place])   . Colostomy in place [facility]) 10/04/2015  . Deep venous thrombosis (HCC)    right lower extremity  . Dependent edema   . Diverticulosis of intestine with bleeding 04/02/2015  . Glenohumeral arthritis 06/28/2012  . Hammertoe 09/02/2012  . History of bilateral hip replacements 09/02/2012  . History of hysterectomy   . Hypertension   . Hypertension   . Hypokalemia   . Inability to ambulate due to hip 10/12/2015  . Mandibular dysfunction   . Onychomycosis 09/02/2012  . Osteoarthritis   . Polycythemia vera ([place])   . Presence of IVC filter 05/25/2015  . Stroke ([place]) 03/26/2015   cerebellar  . Urinary incontinence in female 09/02/2016   HOSPITAL COURSE:   1. Fever, acute cystitis with hematuria. Urine c/s grew 40K providencia sensitive to Keflex 2. Acute encephalopathy and weakness.  Stroke work-up showed an old stroke but nothing acute.  Continue aspirin and Eliquis. 3. History of DVT and PE on Eliquis 4. Hypertension.  Restart Norvasc and monitor 5. Previous stroke with chronic left-sided weakness 6. Polycythemia vera on [place]  DISCHARGE  CONDITIONS:  stable CONSULTS OBTAINED:   DRUG ALLERGIES:  No Known Allergies DISCHARGE MEDICATIONS:   Allergies as of 09/01/2018   No Known Allergies     Medication List    TAKE these medications   amLODipine 5 MG tablet Commonly known as:  NORVASC Take 5 mg by mouth daily.   aspirin EC 81 MG tablet Take 1 tablet (81 mg total) by mouth daily.   cephALEXin 250 MG capsule Commonly known as:  KEFLEX Take 1 capsule (250 mg total) by mouth 2 (two) times daily for 2 days.   cholecalciferol 1000 units tablet Commonly known as:  VITAMIN D Take 1,000 Units by mouth daily.   docusate sodium 100 MG capsule Commonly known as:  COLACE Take 100 mg by mouth 2 (two) times daily.   ELIQUIS 5 MG Tabs tablet Generic drug:  apixaban TAKE ONE TABLET BY MOUTH TWICE A DAY What changed:  how much to take   estradiol 0.1 MG/GM vaginal cream Commonly known as:  ESTRACE Apply 0.5mg  (pea-sized amount)  just inside the vaginal introitus with a finger-tip on Monday, Wednesday and Friday nights.   folic acid 481 MCG tablet Commonly known as:  FOLVITE Take 400 mcg by mouth daily.   ipratropium-albuterol 0.5-2.5 (3) MG/3ML Soln Commonly known as:  DUONEB Take 3 mLs by nebulization every 4 (four) hours as needed (for shortness of breath/wheezing).   JAKAFI 15 MG tablet Generic drug:  ruxolitinib phosphate TAKE 1 TABLET (15 MG TOTAL) BY MOUTH 2 TIMES DAILY. What changed:  See the  new instructions.   metoprolol tartrate 25 MG tablet Commonly known as:  LOPRESSOR Take 0.5 tablets (12.5 mg total) by mouth 2 (two) times daily.        DISCHARGE INSTRUCTIONS:   DIET:  Cardiac diet DISCHARGE CONDITION:  Stable ACTIVITY:  Activity as tolerated OXYGEN:  Home Oxygen: No.  Oxygen Delivery: room air DISCHARGE LOCATION:  Home with home health, Palliative care to follow   If you experience worsening of your admission symptoms, develop shortness of breath, life threatening emergency,  suicidal or homicidal thoughts you must seek medical attention immediately by calling 911 or calling your MD immediately  if symptoms less severe.  You Must read complete instructions/literature along with all the possible adverse reactions/side effects for all the Medicines you take and that have been prescribed to you. Take any new Medicines after you have completely understood and accpet all the possible adverse reactions/side effects.   Please note  You were cared for by a hospitalist during your hospital stay. If you have any questions about your discharge medications or the care you received while you were in the hospital after you are discharged, you can call the unit and asked to speak with the hospitalist on call if the hospitalist that took care of you is not available. Once you are discharged, your primary care physician will handle any further medical issues. Please note that NO REFILLS for any discharge medications will be authorized once you are discharged, as it is imperative that you return to your primary care physician (or establish a relationship with a primary care physician if you do not have one) for your aftercare needs so that they can reassess your need for medications and monitor your lab values.    On the day of Discharge:  VITAL SIGNS:  Blood pressure (!) 175/85, pulse 63, temperature 98 F (36.7 C), temperature source Oral, resp. rate 16, height 5\' 7"  (1.702 m), weight 91.9 kg, SpO2 97 %. PHYSICAL EXAMINATION:  GENERAL:  84 y.o.-year-old patient lying in the bed with no acute distress.  EYES: Pupils equal, round, reactive to light and accommodation. No scleral icterus. Extraocular muscles intact.  HEENT: Head atraumatic, normocephalic. Oropharynx and nasopharynx clear.  NECK:  Supple, no jugular venous distention. No thyroid enlargement, no tenderness.  LUNGS: Normal breath sounds bilaterally, no wheezing, rales,rhonchi or crepitation. No use of accessory muscles of  respiration.  CARDIOVASCULAR: S1, S2 normal. No murmurs, rubs, or gallops.  ABDOMEN: Soft, non-tender, non-distended. Bowel sounds present. No organomegaly or mass.  EXTREMITIES: No pedal edema, cyanosis, or clubbing.  NEUROLOGIC: Cranial nerves II through XII are intact. Muscle strength 5/5 in all extremities. Sensation intact. Gait not checked.  PSYCHIATRIC: The patient is alert and oriented x 3.  SKIN: No obvious rash, lesion, or ulcer.  DATA REVIEW:   CBC Recent Labs  Lab 08/30/18 1600  WBC 9.3  HGB 10.0*  HCT 34.3*  PLT 238    Chemistries  Recent Labs  Lab 08/30/18 1600  NA 138  K 3.9  CL 104  CO2 31  GLUCOSE 96  BUN 10  CREATININE 0.52  CALCIUM 8.4*  AST 17  ALT 11  ALKPHOS 61  BILITOT 0.7     Follow-up Information    [name], MD. Go on 09/02/2018.   Specialty:  Pediatrics Why:  1:20pm Contact information: [address] [place] [address] [phone]           Management plans discussed with the patient, family and they  are in agreement.  CODE STATUS: Prior   TOTAL TIME TAKING CARE OF THIS PATIENT: 45 minutes.    Max Sane M.D on 09/03/2018 at 4:53 PM  Between 7am to 6pm - Pager - [phone]  After 6pm go to [email or web address] - Proofreader  [facility]  Office  [phone]  CC: Primary care physician; [name], MD   Note: This dictation was prepared with Dragon dictation along with smaller phrase technology. Any transcriptional errors that result from this process are unintentional.

## 2018-09-06 ENCOUNTER — Other Ambulatory Visit: Payer: Self-pay | Admitting: Hematology and Oncology

## 2018-09-06 DIAGNOSIS — D45 Polycythemia vera: Secondary | ICD-10-CM

## 2018-09-06 NOTE — Telephone Encounter (Signed)
)     Ref Range & Units 7d ago (08/30/18) 3wk ago (08/15/18) 3wk ago (08/12/18)  WBC 4.0 - 10.5 K/uL 9.3  9.9 R 11.3High  R  RBC 3.87 - 5.11 MIL/uL 3.92  3.18Low  R 3.79Low  R  Hemoglobin 12.0 - 15.0 g/dL 10.0Low   8.3Low  R 9.8Low  R  HCT 36.0 - 46.0 % 34.3Low   27.2Low  R 31.3Low  R  MCV 80.0 - 100.0 fL 87.5  85.5  82.6   MCH 26.0 - 34.0 pg 25.5Low   26.2  25.7Low    MCHC 30.0 - 36.0 g/dL 29.2Low   30.7Low  R 31.2Low  R  RDW 11.5 - 15.5 % 31.4High   32.2High  R 39.6High  R  Platelets 150 - 400 K/uL 238  377 R, CM 446High  R, CM  nRBC 0.0 - 0.2 % 1High  VC    Comment: CORRECTED ON 10/21 AT 1743: PREVIOUSLY REPORTED AS 4.6  Neutrophils Relative % % 83     Neutro Abs 1.7 - 7.7 K/uL 7.7     Lymphocytes Relative % 14     Lymphs Abs 0.7 - 4.0 K/uL 1.3     Monocytes Relative % 3     Monocytes Absolute 0.1 - 1.0 K/uL 0.3     Eosinophils Relative % 0     Eosinophils Absolute 0.0 - 0.5 K/uL 0.0     Basophils Relative % 0     Basophils Absolute 0.0 - 0.1 K/uL 0.0     WBC Morphology  MORPHOLOGY UNREMARKABLE     Smear Review  PLATELET COUNT CONFIRMED BY SMEAR     nRBC 0 /100 WBC 1High      Schistocytes  PRESENT     Ovalocytes  PRESENT     Comment: Performed at [facility], [address] [place], [place] [address]  Resulting Agency  [facility] CL[facility] LAB [facility] CL[facility] LAB [facility] LAB      Specimen Collected: 08/30/18 16:00 Last Resulted: 08/30/18 17:44

## 2018-09-08 ENCOUNTER — Ambulatory Visit: Payer: Medicare Other | Admitting: Urology

## 2018-09-08 ENCOUNTER — Encounter: Payer: Self-pay | Admitting: Urology

## 2018-09-08 NOTE — Progress Notes (Deleted)
09/08/2018 1:11 PM   Yolanda Wells 03-Nov-1932 [id number]  Referring provider: [name], MD Yolanda Wells, [name] [address]  No chief complaint on file.   HPI: Patient is a 84 -year-old [place] American female with a history of recurrent urinary tract infections and vaginal atrophy who presents today for one months follow up.    Background history Referred to [place] by [facility] for recurrent urinary tract infections with her granddaughter, Yolanda Wells.   She has been having an UTI every month.   Reviewing her records,  she has had a documented positive urine cultures. + E. coli resistant to ampicillin and Cipro on June 25, 2018 + E. coli resistant to Cipro on May 17, 2018 + E. coli resistant to ampicillin and Cipro on Apr 06, 2018 + E. coli resistant to ampicillin and Cipro on February 23, 2018 + Pseudomonas aeruginosa resistant to cefepime and ceftazidime on January 08, 2018 + E. coli resistant to ampicillin and Cipro on December 29, 2017 + E. coli resistant to Cipro on November 23, 2017 + E. coli resistant to Cipro on October 06, 2017 + E.coli resistant to Cipro and Levaquin on 08/03/2018  Granddaughter states that these are from straight cath specimens.  Her symptoms with a urinary tract infection consist of dysuria and confusion.  She is incontinence and is wearing three depends daily.  She denies dysuria, gross hematuria, suprapubic pain, back pain, abdominal pain or flank pain associated with UTI's.   She has not had any recent fevers, chills, nausea or vomiting associated with UTI's.   She does not have a history of nephrolithiasis, GU surgery or GU trauma.  She is not sexually active.  She is postmenopausal.  She denies constipation and/or diarrhea.  She does not engage in good perineal hygiene. She does not take tub baths.  Contrast CT on April 13, 2018 noted no obstructing stone or hydronephrosis.  Right kidney distorted by 3 large cysts. Single left renal cyst. No  worrisome renal or adrenal lesion.  She is drinking 3/4 of the hospital jug of water daily.  She drinks one glass of cranberry juice daily.  She is drinking one cup of coffee daily.  No sodas.  No teas.  No alcohol.   She does not take probiotics.  Her PVR is 57 mL.    She was recently hospitalized for TIA and was found to have an UTI (PROVIDENCIA STUARTII).  She was started on vaginal estrogen cream during her visit one month ago.  Today, ***.  Patient denies any gross hematuria, dysuria or suprapubic/flank pain.  Patient denies any fevers, chills, nausea or vomiting.     PMH: Past Medical History:  Diagnosis Date  . Acute deep vein thrombosis (DVT) of distal vein of left lower extremity ([place]) 10/03/2015  . Acute pulmonary embolism ([place]) 10/03/2015  . Anticoagulant long-term use   . Arthritis   . Arthritis   . CHF (congestive heart failure) ([place])   . Chronic anticoagulation 10/03/2015  . Collagen vascular disease ([place])   . Colostomy in place [facility]) 10/04/2015  . Deep venous thrombosis (HCC)    right lower extremity  . Dependent edema   . Diverticulosis of intestine with bleeding 04/02/2015  . Glenohumeral arthritis 06/28/2012  . Hammertoe 09/02/2012  . History of bilateral hip replacements 09/02/2012  . History of hysterectomy   . Hypertension   . Hypertension   . Hypokalemia   . Inability to ambulate due to hip 10/12/2015  .  Mandibular dysfunction   . Onychomycosis 09/02/2012  . Osteoarthritis   . Polycythemia vera ([place])   . Presence of IVC filter 05/25/2015  . Stroke ([place]) 03/26/2015   cerebellar  . Urinary incontinence in female 09/02/2016    Surgical History: Past Surgical History:  Procedure Laterality Date  . ABDOMINAL HYSTERECTOMY    . BLEPHAROPLASTY Right 03/2017   right upper eyelid  . CARDIAC CATHETERIZATION Right 04/03/2015   Procedure: CENTRAL LINE INSERTION;  Surgeon: Sherri Rad, MD;  Location: [facility] ORS;  Service: General;  Laterality: Right;  . COLECTOMY  WITH COLOSTOMY CREATION/HARTMANN PROCEDURE N/A 04/03/2015   Procedure: COLECTOMY WITH COLOSTOMY CREATION/HARTMANN PROCEDURE;  Surgeon: Sherri Rad, MD;  Location: [facility] ORS;  Service: General;  Laterality: N/A;  . COLON SURGERY    . FOOT AMPUTATION     partial  . FOOT AMPUTATION Right   . JOINT REPLACEMENT     Left and Right Hip  . PERIPHERAL VASCULAR CATHETERIZATION N/A 04/02/2015   Procedure: Visceral Angiography;  Surgeon: Algernon Huxley, MD;  Location: [place] CV LAB;  Service: Cardiovascular;  Laterality: N/A;  . PERIPHERAL VASCULAR CATHETERIZATION N/A 04/02/2015   Procedure: Visceral Artery Intervention;  Surgeon: Algernon Huxley, MD;  Location: [place] CV LAB;  Service: Cardiovascular;  Laterality: N/A;  . PERIPHERAL VASCULAR CATHETERIZATION N/A 05/25/2015   Procedure: IVC Filter Insertion;  Surgeon: Katha Cabal, MD;  Location: [place] CV LAB;  Service: Cardiovascular;  Laterality: N/A;  . TOE AMPUTATION     right  . TOTAL HIP ARTHROPLASTY      Home Medications:  Allergies as of 09/08/2018   No Known Allergies     Medication List        Accurate as of 09/08/18  1:11 PM. Always use your most recent med list.          amLODipine 5 MG tablet Commonly known as:  NORVASC Take 5 mg by mouth daily.   aspirin EC 81 MG tablet Take 1 tablet (81 mg total) by mouth daily.   cholecalciferol 1000 units tablet Commonly known as:  VITAMIN D Take 1,000 Units by mouth daily.   docusate sodium 100 MG capsule Commonly known as:  COLACE Take 100 mg by mouth 2 (two) times daily.   ELIQUIS 5 MG Tabs tablet Generic drug:  apixaban TAKE ONE TABLET BY MOUTH TWICE A DAY   estradiol 0.1 MG/GM vaginal cream Commonly known as:  ESTRACE Apply 0.86m (pea-sized amount)  just inside the vaginal introitus with a finger-tip on Monday, Wednesday and Friday nights.   folic acid 4662MCG tablet Commonly known as:  FOLVITE Take 400 mcg by mouth daily.   ipratropium-albuterol  0.5-2.5 (3) MG/3ML Soln Commonly known as:  DUONEB Take 3 mLs by nebulization every 4 (four) hours as needed (for shortness of breath/wheezing).   JAKAFI 15 MG tablet Generic drug:  ruxolitinib phosphate TAKE 1 TABLET (15 MG TOTAL) BY MOUTH 2 TIMES DAILY.   metoprolol tartrate 25 MG tablet Commonly known as:  LOPRESSOR Take 0.5 tablets (12.5 mg total) by mouth 2 (two) times daily.       Allergies:  No Known Allergies  Family History: Family History  Problem Relation Age of Onset  . Brain cancer Mother   . Heart attack Father   . COPD Father   . Coronary artery disease Father   . Hypertension Unknown   . Arthritis-Osteo Unknown   . Diabetes Brother   . Arthritis Brother   . Osteosarcoma  Son   . Bone cancer Son   . Arthritis Sister   . Arthritis Brother   . Hypertension Brother   . Coronary artery disease Brother   . Malignant hyperthermia Brother     Social History:  reports that she has never smoked. She has never used smokeless tobacco. She reports that she does not drink alcohol or use drugs.  ROS:                                        Physical Exam: There were no vitals taken for this visit.  Constitutional: Well nourished. Alert and oriented, No acute distress. HEENT: [place] AT, moist mucus membranes. Trachea midline, no masses. Cardiovascular: No clubbing, cyanosis, or edema. Respiratory: Normal respiratory effort, no increased work of breathing. GI: Abdomen is soft, non tender, non distended, no abdominal masses. Liver and spleen not palpable.  No hernias appreciated.  Stool sample for occult testing is not indicated.   GU: No CVA tenderness.  No bladder fullness or masses.  Normal external genitalia, normal pubic hair distribution, no lesions.  Normal urethral meatus, no lesions, no prolapse, no discharge.   No urethral masses, tenderness and/or tenderness. No bladder fullness, tenderness or masses. Normal vagina mucosa, good estrogen  effect, no discharge, no lesions, good pelvic support, no cystocele or rectocele noted.  No cervical motion tenderness.  Uterus is freely mobile and non-fixed.  No adnexal/parametria masses or tenderness noted.  Anus and perineum are without rashes or lesions.   *** Skin: No rashes, bruises or suspicious lesions. Lymph: No cervical or inguinal adenopathy. Neurologic: Grossly intact, no focal deficits, moving all 4 extremities. Psychiatric: Normal mood and affect.  Laboratory Data: Lab Results  Component Value Date   WBC 9.3 08/30/2018   HGB 10.0 (L) 08/30/2018   HCT 34.3 (L) 08/30/2018   MCV 87.5 08/30/2018   PLT 238 08/30/2018    Lab Results  Component Value Date   CREATININE 0.52 08/30/2018    No results found for: PSA  No results found for: TESTOSTERONE  Lab Results  Component Value Date   HGBA1C 5.6 08/31/2018    Lab Results  Component Value Date   TSH 1.823 06/07/2018       Component Value Date/Time   CHOL 104 08/31/2018 0439   CHOL 64 02/20/2012 0435   HDL 35 (L) 08/31/2018 0439   HDL 10 (L) 02/20/2012 0435   CHOLHDL 3.0 08/31/2018 0439   VLDL 5 08/31/2018 0439   VLDL 12 02/20/2012 0435   LDLCALC 64 08/31/2018 0439   LDLCALC 42 02/20/2012 0435    Lab Results  Component Value Date   AST 17 08/30/2018   Lab Results  Component Value Date   ALT 11 08/30/2018   No components found for: ALKALINEPHOPHATASE No components found for: BILIRUBINTOTAL  No results found for: ESTRADIOL  Urinalysis    Component Value Date/Time   COLORURINE YELLOW (A) 08/30/2018 1443   APPEARANCEUR CLEAR (A) 08/30/2018 1443   APPEARANCEUR Hazy 07/08/2014 0956   LABSPEC 1.018 08/30/2018 1443   LABSPEC 1.019 07/08/2014 0956   PHURINE 5.0 08/30/2018 1443   GLUCOSEU NEGATIVE 08/30/2018 1443   GLUCOSEU Negative 07/08/2014 0956   HGBUR SMALL (A) 08/30/2018 1443   BILIRUBINUR NEGATIVE 08/30/2018 1443   BILIRUBINUR Negative 07/08/2014 0956   KETONESUR NEGATIVE 08/30/2018 1443     PROTEINUR NEGATIVE 08/30/2018 1443   NITRITE NEGATIVE 08/30/2018  [place] (A) 08/30/2018 1443   LEUKOCYTESUR 1+ 07/08/2014 0956    I have reviewed the labs.   Pertinent Imaging: ***  Assessment & Plan:    1. Recurrent UTI Criteria for recurrent UTI has been met with 2 or more infections in 6 months or 3 or greater infections in one year  Patient and granddaughter are instructed to increase their water intake until the urine is pale yellow or clear (10 to 12 cups daily)  Patient and granddaughter are instructed to take probiotics (yogurt, oral pills or vaginal suppositories), take cranberry pills or drink the juice and Vitamin C 1,000 mg daily to acidify the urine  Avoid soaking in tubs and wipe front to back after urinating  Granddaughter is quite familiar with antibiotic stewardship and is concerned that her grandmother has been on several antibiotics in the past for these infections, she also has home health come out and straight catheter for the urine for cultures                         Bladder Scan (Post Void Residual) in office   2. Vaginal atrophy I explained to the patient that when women go through menopause and her estrogen levels are severely diminished, the normal vaginal flora will change.  This is due to an increase of the vaginal canal's pH. Because of this, the vaginal canal may be colonized by bacteria from the rectum instead of the protective lactobacillus.  This, accompanied by the loss of the mucus barrier with vaginal atrophy, is a cause of recurrent urinary tract infections. In some studies, the use of vaginal estrogen cream has been demonstrated to reduce  recurrent urinary tract infections to one a year.  Patient was given a sample of vaginal estrogen cream (Premarin vaginal cream) and instructed to apply 0.14m (pea-sized amount)  just inside the vaginal introitus with a finger-tip on Monday, Wednesday and Friday nights.  I explained to the  patient that vaginally administered estrogen, which causes only a slight increase in the blood estrogen levels, have fewer contraindications and adverse systemic effects that oral HT. I have also given prescriptions for the Estrace cream and Premarin cream, so that the patient may carry them to the pharmacy to see which one of the branded creams would be most economical for her.  If she finds both medications cost prohibitive, she is instructed to call the office.  We can then call in a compounded vaginal estrogen cream for the patient that may be more affordable.   She will follow up in one month for an exam.          No follow-ups on file.  These notes generated with voice recognition software. I apologize for typographical errors.  SZara Council PA-C  [facility] [address] [place] [phone]

## 2018-09-09 ENCOUNTER — Inpatient Hospital Stay: Payer: Medicare Other

## 2018-09-09 ENCOUNTER — Other Ambulatory Visit: Payer: Self-pay

## 2018-09-09 ENCOUNTER — Inpatient Hospital Stay
Admission: EM | Admit: 2018-09-09 | Discharge: 2018-09-12 | DRG: 378 | Disposition: A | Discharge status: Home Health | Payer: Medicare Other | Attending: Internal Medicine | Admitting: Internal Medicine

## 2018-09-09 ENCOUNTER — Encounter: Payer: Self-pay | Admitting: Internal Medicine

## 2018-09-09 DIAGNOSIS — I69354 Hemiplegia and hemiparesis following cerebral infarction affecting left non-dominant side: Secondary | ICD-10-CM | POA: Diagnosis not present

## 2018-09-09 DIAGNOSIS — Z7951 Long term (current) use of inhaled steroids: Secondary | ICD-10-CM | POA: Diagnosis not present

## 2018-09-09 DIAGNOSIS — Z87440 Personal history of urinary (tract) infections: Secondary | ICD-10-CM | POA: Diagnosis not present

## 2018-09-09 DIAGNOSIS — D45 Polycythemia vera: Secondary | ICD-10-CM | POA: Diagnosis present

## 2018-09-09 DIAGNOSIS — Z9049 Acquired absence of other specified parts of digestive tract: Secondary | ICD-10-CM

## 2018-09-09 DIAGNOSIS — Z90710 Acquired absence of both cervix and uterus: Secondary | ICD-10-CM

## 2018-09-09 DIAGNOSIS — Z86711 Personal history of pulmonary embolism: Secondary | ICD-10-CM | POA: Diagnosis not present

## 2018-09-09 DIAGNOSIS — E669 Obesity, unspecified: Secondary | ICD-10-CM | POA: Diagnosis present

## 2018-09-09 DIAGNOSIS — Z993 Dependence on wheelchair: Secondary | ICD-10-CM | POA: Diagnosis not present

## 2018-09-09 DIAGNOSIS — Z7982 Long term (current) use of aspirin: Secondary | ICD-10-CM | POA: Diagnosis not present

## 2018-09-09 DIAGNOSIS — Z7901 Long term (current) use of anticoagulants: Secondary | ICD-10-CM

## 2018-09-09 DIAGNOSIS — K921 Melena: Secondary | ICD-10-CM | POA: Diagnosis present

## 2018-09-09 DIAGNOSIS — K922 Gastrointestinal hemorrhage, unspecified: Secondary | ICD-10-CM | POA: Diagnosis present

## 2018-09-09 DIAGNOSIS — K9401 Colostomy hemorrhage: Secondary | ICD-10-CM | POA: Diagnosis not present

## 2018-09-09 DIAGNOSIS — Z86718 Personal history of other venous thrombosis and embolism: Secondary | ICD-10-CM

## 2018-09-09 DIAGNOSIS — Z95828 Presence of other vascular implants and grafts: Secondary | ICD-10-CM | POA: Diagnosis not present

## 2018-09-09 DIAGNOSIS — Z79899 Other long term (current) drug therapy: Secondary | ICD-10-CM

## 2018-09-09 DIAGNOSIS — I10 Essential (primary) hypertension: Secondary | ICD-10-CM | POA: Diagnosis present

## 2018-09-09 DIAGNOSIS — K5900 Constipation, unspecified: Secondary | ICD-10-CM

## 2018-09-09 DIAGNOSIS — D62 Acute posthemorrhagic anemia: Secondary | ICD-10-CM | POA: Diagnosis present

## 2018-09-09 DIAGNOSIS — Z6831 Body mass index (BMI) 31.0-31.9, adult: Secondary | ICD-10-CM

## 2018-09-09 DIAGNOSIS — Z89431 Acquired absence of right foot: Secondary | ICD-10-CM

## 2018-09-09 DIAGNOSIS — Z66 Do not resuscitate: Secondary | ICD-10-CM | POA: Diagnosis present

## 2018-09-09 DIAGNOSIS — Z96643 Presence of artificial hip joint, bilateral: Secondary | ICD-10-CM | POA: Diagnosis present

## 2018-09-09 DIAGNOSIS — Z933 Colostomy status: Secondary | ICD-10-CM

## 2018-09-09 DIAGNOSIS — R609 Edema, unspecified: Secondary | ICD-10-CM

## 2018-09-09 LAB — CBC
HCT: 32.2 % — ABNORMAL LOW (ref 36.0–46.0)
HEMOGLOBIN: 9.3 g/dL — AB (ref 12.0–15.0)
MCH: 25 pg — ABNORMAL LOW (ref 26.0–34.0)
MCHC: 28.9 g/dL — ABNORMAL LOW (ref 30.0–36.0)
MCV: 86.6 fL (ref 80.0–100.0)
Platelets: 371 10*3/uL (ref 150–400)
RBC: 3.72 MIL/uL — AB (ref 3.87–5.11)
RDW: 30.9 % — ABNORMAL HIGH (ref 11.5–15.5)
WBC: 10.1 10*3/uL (ref 4.0–10.5)
nRBC: 4.3 % — ABNORMAL HIGH (ref 0.0–0.2)

## 2018-09-09 LAB — COMPREHENSIVE METABOLIC PANEL
ALT: 9 U/L (ref 0–44)
AST: 23 U/L (ref 15–41)
Albumin: 3 g/dL — ABNORMAL LOW (ref 3.5–5.0)
Alkaline Phosphatase: 65 U/L (ref 38–126)
Anion gap: 8 (ref 5–15)
BILIRUBIN TOTAL: 0.7 mg/dL (ref 0.3–1.2)
BUN: 9 mg/dL (ref 8–23)
CO2: 32 mmol/L (ref 22–32)
CREATININE: 0.57 mg/dL (ref 0.44–1.00)
Calcium: 9 mg/dL (ref 8.9–10.3)
Chloride: 102 mmol/L (ref 98–111)
GFR calc Af Amer: >60 mL/min (ref >60)
GFR calc non Af Amer: >60 mL/min (ref >60)
Glucose, Bld: 99 mg/dL (ref 70–99)
Potassium: 4 mmol/L (ref 3.5–5.1)
Sodium: 142 mmol/L (ref 135–145)
TOTAL PROTEIN: 6.9 g/dL (ref 6.5–8.1)

## 2018-09-09 LAB — TYPE AND SCREEN
ABO/RH(D): O POS
Antibody Screen: NEGATIVE

## 2018-09-09 LAB — GLUCOSE, CAPILLARY
Glucose-Capillary: 88 mg/dL (ref 70–99)
Glucose-Capillary: 94 mg/dL (ref 70–99)

## 2018-09-09 LAB — FERRITIN: Ferritin: 64 ng/mL (ref 11–307)

## 2018-09-09 LAB — PROTIME-INR
INR: 1.45
PROTHROMBIN TIME: 17.5 s — AB (ref 11.4–15.2)

## 2018-09-09 LAB — HEMOGLOBIN: Hemoglobin: 9.1 g/dL — ABNORMAL LOW (ref 12.0–15.0)

## 2018-09-09 LAB — FOLATE: Folate: 34 ng/mL (ref >5.9)

## 2018-09-09 MED ORDER — ESTRADIOL 0.1 MG/GM VA CREA
1.0000 | TOPICAL_CREAM | VAGINAL | Status: DC
  Administered 2018-09-10: 1 via VAGINAL
  Filled 2018-09-09: qty 42.5

## 2018-09-09 MED ORDER — IPRATROPIUM-ALBUTEROL 0.5-2.5 (3) MG/3ML IN SOLN
3.0000 mL | RESPIRATORY_TRACT | Status: DC | PRN
  Administered 2018-09-10 – 2018-09-11 (×2): 3 mL via RESPIRATORY_TRACT
  Filled 2018-09-09 (×2): qty 3

## 2018-09-09 MED ORDER — AMLODIPINE BESYLATE 5 MG PO TABS
5.0000 mg | ORAL_TABLET | Freq: Every day | ORAL | Status: DC
  Administered 2018-09-10 – 2018-09-12 (×3): 5 mg via ORAL
  Filled 2018-09-09 (×3): qty 1

## 2018-09-09 MED ORDER — ONDANSETRON HCL 4 MG PO TABS: 4.0000 mg | ORAL_TABLET | Freq: Four times a day (QID) | ORAL | Status: DC | PRN

## 2018-09-09 MED ORDER — PANTOPRAZOLE SODIUM 40 MG PO TBEC
40.0000 mg | DELAYED_RELEASE_TABLET | Freq: Every day | ORAL | Status: DC
  Administered 2018-09-09 – 2018-09-12 (×4): 40 mg via ORAL
  Filled 2018-09-09 (×4): qty 1

## 2018-09-09 MED ORDER — ACETAMINOPHEN 650 MG RE SUPP: 650.0000 mg | Freq: Four times a day (QID) | RECTAL | Status: DC | PRN

## 2018-09-09 MED ORDER — ONDANSETRON HCL 4 MG/2ML IJ SOLN
4.0000 mg | Freq: Once | INTRAMUSCULAR | Status: AC
  Administered 2018-09-09: 4 mg via INTRAVENOUS
  Filled 2018-09-09: qty 2

## 2018-09-09 MED ORDER — RUXOLITINIB PHOSPHATE 15 MG PO TABS
15.0000 mg | ORAL_TABLET | Freq: Two times a day (BID) | ORAL | Status: DC
  Administered 2018-09-09 – 2018-09-12 (×4): 15 mg via ORAL
  Filled 2018-09-09 (×5): qty 1

## 2018-09-09 MED ORDER — ONDANSETRON HCL 4 MG/2ML IJ SOLN: 4.0000 mg | Freq: Four times a day (QID) | INTRAMUSCULAR | Status: DC | PRN

## 2018-09-09 MED ORDER — ACETAMINOPHEN 325 MG PO TABS
650.0000 mg | ORAL_TABLET | Freq: Four times a day (QID) | ORAL | Status: DC | PRN
  Administered 2018-09-11: 17:00:00 650 mg via ORAL
  Filled 2018-09-09: qty 2

## 2018-09-09 MED ORDER — METOPROLOL TARTRATE 25 MG PO TABS
12.5000 mg | ORAL_TABLET | Freq: Two times a day (BID) | ORAL | Status: DC
  Administered 2018-09-09 – 2018-09-12 (×6): 12.5 mg via ORAL
  Filled 2018-09-09 (×6): qty 1

## 2018-09-09 MED ORDER — FOLIC ACID 1 MG PO TABS
0.5000 mg | ORAL_TABLET | Freq: Every day | ORAL | Status: DC
  Administered 2018-09-09 – 2018-09-12 (×4): 0.5 mg via ORAL
  Filled 2018-09-09 (×4): qty 1

## 2018-09-09 MED FILL — JAKAFI 15 MG TABLET: 15 | 30 days supply | Qty: 60 | Fill #0

## 2018-09-09 NOTE — Consult Note (Signed)
Cephas Darby, MD [address]  [place]  [place], [place] [address]  Main: [phone]  Fax: [phone] Pager: [phone]   Consultation  Referring Provider:     No ref. provider found Primary Care Physician:  [name], MD Primary Gastroenterologist: None         Reason for Consultation:     Bleeding from the colostomy  Date of Admission:  09/09/2018 Date of Consultation:  09/09/2018         HPI:   Yolanda Wells is a 84 y.o. 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  She was recently treated for UTI and fever with Keflex.  Patient presented to the ER after her family noticed diarrhea with mucus and bright red blood in the colostomy bag.  No other complaints on admission.  Her hemoglobin is 9.3 today.  Her last normal hemoglobin was 12.3 in 02/2018.  Since then, her hemoglobin has been fluctuating between 9 and 11.  She did have low ferritin in the past but most recently normal. No leukocytosis on admission.  NSAIDs: Aspirin 81  Antiplts/Anticoagulants/Anti thrombotics: Aspirin and Eliquis with history of PE, DVT and stroke  GI Procedures:   Surgical pathology-04/03/2015 DIAGNOSIS:  A. COLON, SPLENIC FLEXURE; SEGMENTAL RESECTION:  - ACUTE TRANSMURAL ISCHEMIC NECROSIS EXTENDING TO ONE RESECTION MARGIN.  - INTRAVASCULAR BEADS CONSISTENT WITH PREVIOUS EMBOLIZATION.  - CLOT ADHERENT TO MUCOSA.  - FOCAL PNEUMATOSIS CYSTOIDES INTESTINALIS.  - TUBULAR ADENOMA, 0.5 CM, NEGATIVE FOR HIGH GRADE DYSPLASIA.   Past Medical History:  Diagnosis Date  . Acute deep vein thrombosis (DVT) of distal vein of left lower extremity ([place]) 10/03/2015  . Acute pulmonary embolism ([place]) 10/03/2015  . Anticoagulant long-term use   . Arthritis   . Arthritis   . CHF (congestive heart failure)  ([place])   . Chronic anticoagulation 10/03/2015  . Collagen vascular disease ([place])   . Colostomy in place [facility]) 10/04/2015  . Deep venous thrombosis (HCC)    right lower extremity  . Dependent edema   . Diverticulosis of intestine with bleeding 04/02/2015  . Glenohumeral arthritis 06/28/2012  . Hammertoe 09/02/2012  . History of bilateral hip replacements 09/02/2012  . History of hysterectomy   . Hypertension   . Hypertension   . Hypokalemia   . Inability to ambulate due to hip 10/12/2015  . Mandibular dysfunction   . Onychomycosis 09/02/2012  . Osteoarthritis   . Polycythemia vera ([place])   . Presence of IVC filter 05/25/2015  . Stroke ([place]) 03/26/2015   cerebellar  . Urinary incontinence in female 09/02/2016    Past Surgical History:  Procedure Laterality Date  . ABDOMINAL HYSTERECTOMY    . BLEPHAROPLASTY Right 03/2017   right upper eyelid  . CARDIAC CATHETERIZATION Right 04/03/2015   Procedure: CENTRAL LINE INSERTION;  Surgeon: Sherri Rad, MD;  Location: [facility] ORS;  Service: General;  Laterality: Right;  . COLECTOMY WITH COLOSTOMY CREATION/HARTMANN PROCEDURE N/A 04/03/2015   Procedure: COLECTOMY WITH COLOSTOMY CREATION/HARTMANN PROCEDURE;  Surgeon: Sherri Rad, MD;  Location: [facility] ORS;  Service: General;  Laterality: N/A;  . COLON SURGERY    . FOOT AMPUTATION     partial  . FOOT AMPUTATION Right   . IVC FILTER INSERTION    . JOINT REPLACEMENT     Left and Right Hip  . PERIPHERAL VASCULAR CATHETERIZATION N/A 04/02/2015  Procedure: Visceral Angiography;  Surgeon: Algernon Huxley, MD;  Location: [place] [facility];  Service: Cardiovascular;  Laterality: N/A;  . PERIPHERAL VASCULAR CATHETERIZATION N/A 04/02/2015   Procedure: Visceral Artery Intervention;  Surgeon: Algernon Huxley, MD;  Location: [place] [facility];  Service: Cardiovascular;  Laterality: N/A;  . PERIPHERAL VASCULAR CATHETERIZATION N/A 05/25/2015   Procedure: IVC Filter Insertion;  Surgeon: Katha Cabal, MD;   Location: [place] CV [facility];  Service: Cardiovascular;  Laterality: N/A;  . TOE AMPUTATION     right  . TOTAL HIP ARTHROPLASTY      Prior to Admission medications   Medication Sig Start Date End Date Taking? Authorizing Provider  amLODipine (NORVASC) 5 MG tablet Take 5 mg by mouth daily.   Yes [provider]  aspirin EC 81 MG tablet Take 1 tablet (81 mg total) by mouth daily. 09/16/15  Yes [name], MD  cholecalciferol (VITAMIN D) 1000 units tablet Take 1,000 Units by mouth daily.   Yes [provider]  docusate sodium (COLACE) 100 MG capsule Take 100 mg by mouth 2 (two) times daily.    Yes [provider]  ELIQUIS 5 MG TABS tablet TAKE ONE TABLET BY MOUTH TWICE A DAY Patient taking differently: Take 5 mg by mouth 2 (two) times daily.  07/12/18  Yes Lequita Asal, MD  estradiol (ESTRACE VAGINAL) 0.1 MG/GM vaginal cream Apply 0.5m (pea-sized amount)  just inside the vaginal introitus with a finger-tip on Monday, Wednesday and Friday nights. 08/09/18  Yes McGowan, SLarene BeachA, PA-C  folic acid (FOLVITE) 4100MCG tablet Take 400 mcg by mouth daily.   Yes [provider]  ipratropium-albuterol (DUONEB) 0.5-2.5 (3) MG/3ML SOLN Take 3 mLs by nebulization every 4 (four) hours as needed (for shortness of breath/wheezing).    Yes [provider]  JAKAFI 15 MG tablet TAKE 1 TABLET (15 MG TOTAL) BY MOUTH 2 TIMES DAILY. Patient taking differently: Take 15 mg by mouth 2 (two) times daily.  09/06/18  Yes GKaren Kitchens NP  metoprolol tartrate (LOPRESSOR) 25 MG tablet Take 0.5 tablets (12.5 mg total) by mouth 2 (two) times daily. 09/01/18  Yes SMax Sane MD    Family History  Problem Relation Age of Onset  . Brain cancer Mother   . Heart attack Father   . COPD Father   . Coronary artery disease Father   . Hypertension Unknown   . Arthritis-Osteo Unknown   . Diabetes Brother   . Arthritis Brother   . Osteosarcoma Son   . Bone cancer Son     . Arthritis Sister   . Arthritis Brother   . Hypertension Brother   . Coronary artery disease Brother   . Malignant hyperthermia Brother      Social History   Tobacco Use  . Smoking status: Never Smoker  . Smokeless tobacco: Never Used  Substance Use Topics  . Alcohol use: No  . Drug use: No    Allergies as of 09/09/2018  . (No Known Allergies)    Review of Systems:    All systems reviewed and negative except where noted in HPI.   Physical Exam:  Vital signs in last 24 hours: Temp:  [98 F (36.7 C)-98.6 F (37 C)] 98 F (36.7 C) (10/31 1554) Pulse Rate:  [67-91] 72 (10/31 1554) Resp:  [15-27] 20 (10/31 1554) BP: (126-173)/(72-94) 137/72 (10/31 1554) SpO2:  [79 %-100 %] 100 % (10/31 1554) Weight:  [91.6 kg] 91.6 kg (10/31 0939)  General:  ooperative in NAD Head:  Normocephalic and atraumatic. Eyes:   No icterus.   Conjunctiva pink.  Right eye atrophic Ears:  Normal auditory acuity. Neck:  Supple; no masses or thyroidomegaly Lungs: Respirations even and unlabored. Lungs clear to auscultation bilaterally.   No wheezes, crackles, or rhonchi.  Heart:  Regular rate and rhythm;  Without murmur, clicks, rubs or gallops Abdomen:  Soft, obese, nondistended, nontender. Normal bowel sounds. No appreciable masses or hepatomegaly.  No rebound or guarding, colostomy bag in left lower quadrant, scant blood-tinged mucus stool in the bag.  Rectal:  Not performed. Msk:  Symmetrical without gross deformities.  Left-sided hemiplegia Extremities:  Without edema, cyanosis or clubbing. Neurologic:  Alert and oriented x3;  grossly normal neurologically. Skin:  Intact without significant lesions or rashes. Cervical Nodes:  No significant cervical adenopathy. Psych:  Alert and cooperative. Normal affect.  LAB RESULTS: CBC Latest Ref Rng & Units 09/09/2018 09/09/2018 08/30/2018  WBC 4.0 - 10.5 K/uL - 10.1 9.3  Hemoglobin 12.0 - 15.0 g/dL 9.1(L) 9.3(L) 10.0(L)  Hematocrit 36.0 - 46.0 %  - 32.2(L) 34.3(L)  Platelets 150 - 400 K/uL - 371 238    BMET BMP Latest Ref Rng & Units 09/09/2018 08/30/2018 08/15/2018  Glucose 70 - 99 mg/dL 99 96 -  BUN 8 - 23 mg/dL 9 10 -  Creatinine 0.44 - 1.00 mg/dL 0.57 0.52 0.58  Sodium 135 - 145 mmol/L 142 138 -  Potassium 3.5 - 5.1 mmol/L 4.0 3.9 -  Chloride 98 - 111 mmol/L 102 104 -  CO2 22 - 32 mmol/L 32 31 -  Calcium 8.9 - 10.3 mg/dL 9.0 8.4(L) -    LFT Hepatic Function Latest Ref Rng & Units 09/09/2018 08/30/2018 06/15/2018  Total Protein 6.5 - 8.1 g/dL 6.9 6.4(L) 7.4  Albumin 3.5 - 5.0 g/dL 3.0(L) 2.9(L) 3.4(L)  AST 15 - 41 U/L _0 ALT 0 - 44 U/L _1 Alk Phosphatase 38 - 126 U/L 65 61 71  Total Bilirubin 0.3 - 1.2 mg/dL 0.7 0.7 0.9  Bilirubin, Direct 0.0 - 0.2 mg/dL - - -     STUDIES: US Venous Img Lower Bilateral  Result Date: 09/09/2018 CLINICAL DATA:  Lower extremity edema and history of prior bilateral lower extremity DVT EXAM: BILATERAL LOWER EXTREMITY VENOUS DOPPLER ULTRASOUND TECHNIQUE: Gray-scale sonography with graded compression, as well as color Doppler and duplex ultrasound were performed to evaluate the lower extremity deep venous systems from the level of the common femoral vein and including the common femoral, femoral, profunda femoral, popliteal and calf veins including the posterior tibial, peroneal and gastrocnemius veins when visible. The superficial great saphenous vein was also interrogated. Spectral Doppler was utilized to evaluate flow at rest and with distal augmentation maneuvers in the common femoral, femoral and popliteal veins. COMPARISON:  05/18/2015 and 02/05/2018 FINDINGS: RIGHT LOWER EXTREMITY Common Femoral Vein: No evidence of thrombus. Normal compressibility, respiratory phasicity and response to augmentation. Saphenofemoral Junction: No evidence of thrombus. Normal compressibility and flow on color Doppler imaging. Profunda Femoral Vein: No evidence of thrombus. Normal compressibility and  flow on color Doppler imaging. Femoral Vein: The mid to distal right femoral vein in the thigh demonstrates a mild amount echogenic mural thrombus consistent with chronic thrombus. This does not cause any significant luminal stenosis or flow limitation. Popliteal Vein: Distal aspect of the right popliteal vein demonstrates minimal wall thickening without flow limitation or significant stenosis. This likely is the residua prior DVT. Calf  Veins: Probable minimal amount of chronic thrombus in the visualized right posterior tibial vein. Peroneal vein shows normal patency. Superficial Great Saphenous Vein: No evidence of thrombus. Normal compressibility. Venous Reflux:  None. Other Findings: No evidence of superficial thrombophlebitis or abnormal fluid collection. LEFT LOWER EXTREMITY Common Femoral Vein: There is some chronic and stable appearing nonocclusive mural thrombus in the common femoral vein causing some luminal narrowing but no significant flow limitation. Saphenofemoral Junction: No evidence of thrombus. Normal compressibility and flow on color Doppler imaging. Profunda Femoral Vein: No evidence of thrombus. Normal compressibility and flow on color Doppler imaging. Femoral Vein: Mild echogenic mural irregularity in the left femoral vein is consistent with mild chronic thrombus related to prior DVT. This does not cause significant flow limitation or luminal stenosis. Popliteal Vein: No evidence of thrombus. Normal compressibility, respiratory phasicity and response to augmentation. Calf Veins: No evidence of thrombus. Normal compressibility and flow on color Doppler imaging. Superficial Great Saphenous Vein: No evidence of thrombus. Normal compressibility. Venous Reflux:  None. Other Findings: No evidence of superficial thrombophlebitis or abnormal fluid collection. IMPRESSION: Mild amount of chronic thrombus in the right femoral and popliteal veins and likely right posterior tibial vein. Mild amount of  chronic thrombus in the left common femoral and femoral veins. No significant flow limitation or luminal stenosis. No acute appearing thrombus identified. Electronically Signed   By: Aletta Edouard M.D.   On: 09/09/2018 15:39      Impression / Plan:   Yolanda Wells is a 84 y.o. female with left-sided hemiplegia, PE/DVT, polycythemia vera on Eliquis, recently treated with Keflex for UTI presented with loose stool mixed with scant blood and mucus.  Patient has mild drop in hemoglobin, however Hb within the range compared to past  -Recommend stool studies to rule out C. difficile and other GI pathogens -Will reassess the need for colonoscopy after ruling out infection -Patient is a high risk candidate for anesthesia, risks of performing colonoscopy overweigh the benefits as there is no evidence of hemodynamically significant GI bleed at this time  Thank you for involving me in the care of this patient.  Will follow along with you    LOS: 0 days   Sherri Sear, MD  09/09/2018, 6:09 PM   Note: This dictation was prepared with Dragon dictation along with smaller phrase technology. Any transcriptional errors that result from this process are unintentional.

## 2018-09-09 NOTE — ED Triage Notes (Signed)
Pt brought in by [facility] due to bleeding from ostomy since 0200 this morning. Small amount of blood noted in ostomy bag. Pt denies pain. Also c/o non-productive cough that started while enroute to ER. Pt appear short of breath. States she wears 2 lpm oxygen via [place] for comfort. She is 89% on room air and was placed on 2 lpm via [place].

## 2018-09-09 NOTE — ED Notes (Signed)
Patient transported to US 

## 2018-09-09 NOTE — ED Provider Notes (Signed)
[facility] Emergency Department Provider Note   ____________________________________________    I have reviewed the triage vital signs and the nursing notes.   HISTORY  Chief Complaint GI Bleeding     HPI Yolanda Wells is a 84 y.o. female with extensor past medical history on Eliquis for PE/DVT presents with bleeding into her ostomy.  Patient has a history of a bleeding diverticuli which required colectomy.  Granddaughter describes changing the ostomy bag several times and each time 10 to 20 mL's of dark red blood with little stool.  No reports of black stool the last few days.  No nausea or vomiting.  Patient denies abdominal pain  Past Medical History:  Diagnosis Date  . Acute deep vein thrombosis (DVT) of distal vein of left lower extremity ([place]) 10/03/2015  . Acute pulmonary embolism ([place]) 10/03/2015  . Anticoagulant long-term use   . Arthritis   . Arthritis   . CHF (congestive heart failure) ([place])   . Chronic anticoagulation 10/03/2015  . Collagen vascular disease ([place])   . Colostomy in place [facility]) 10/04/2015  . Deep venous thrombosis (HCC)    right lower extremity  . Dependent edema   . Diverticulosis of intestine with bleeding 04/02/2015  . Glenohumeral arthritis 06/28/2012  . Hammertoe 09/02/2012  . History of bilateral hip replacements 09/02/2012  . History of hysterectomy   . Hypertension   . Hypertension   . Hypokalemia   . Inability to ambulate due to hip 10/12/2015  . Mandibular dysfunction   . Onychomycosis 09/02/2012  . Osteoarthritis   . Polycythemia vera ([place])   . Presence of IVC filter 05/25/2015  . Stroke ([place]) 03/26/2015   cerebellar  . Urinary incontinence in female 09/02/2016    Patient Active Problem List   Diagnosis Date Noted  . AMS (altered mental status) 08/30/2018  . Pressure injury of skin 08/13/2018  . B12 deficiency 08/04/2018  . Anemia 06/07/2018  . Secondary myelofibrosis ([place]) 02/17/2018  .  Goals of care, counseling/discussion 12/23/2017  . Palliative care by specialist   . DNR (do not resuscitate) discussion   . Weakness generalized   . Chronic diastolic CHF (congestive heart failure) ([place]) 07/28/2017  . Vascular dementia without behavioral disturbance ([place]) 07/08/2017  . Influenza with respiratory manifestation 12/01/2016  . Respiratory distress 11/23/2016  . Urinary incontinence in female 09/02/2016  . Vision loss of right eye 08/26/2016  . Blindness of right eye 08/26/2016  . Inability to ambulate due to hip 10/12/2015  . Colostomy in place [facility]) 10/04/2015  . Long term current use of anticoagulant therapy 10/03/2015  . Leukocytosis 09/25/2015  . CAP (community acquired pneumonia) 09/24/2015  . COPD (chronic obstructive pulmonary disease) ([place]) 09/24/2015  . UTI (urinary tract infection) 07/16/2015  . Abdominal wall cellulitis 07/15/2015  . DVT (deep venous thrombosis) ([place]) 07/15/2015  . HTN (hypertension) 07/15/2015  . Polycythemia vera ([place]) 07/15/2015  . Arthritis 07/15/2015  . Pulmonary emboli ([place]) 05/25/2015  . Diverticulosis of colon with hemorrhage 04/02/2015  . Diverticulosis of intestine with bleeding 04/02/2015  . Fall 01/10/2015  . Osteoarthritis 01/10/2015  . Hammertoe 09/02/2012  . S/P transmetatarsal amputation of foot ([place]) 09/02/2012  . Onychomycosis 09/02/2012  . Other specified dermatoses 09/02/2012  . S/P hip replacement 08/23/2012  . Glenohumeral arthritis 06/28/2012    Past Surgical History:  Procedure Laterality Date  . ABDOMINAL HYSTERECTOMY    . BLEPHAROPLASTY Right 03/2017   right upper eyelid  . CARDIAC CATHETERIZATION Right 04/03/2015  Procedure: CENTRAL LINE INSERTION;  Surgeon: Sherri Rad, MD;  Location: [facility] ORS;  Service: General;  Laterality: Right;  . COLECTOMY WITH COLOSTOMY CREATION/HARTMANN PROCEDURE N/A 04/03/2015   Procedure: COLECTOMY WITH COLOSTOMY CREATION/HARTMANN PROCEDURE;  Surgeon: Sherri Rad, MD;  Location:  [facility] ORS;  Service: General;  Laterality: N/A;  . COLON SURGERY    . FOOT AMPUTATION     partial  . FOOT AMPUTATION Right   . JOINT REPLACEMENT     Left and Right Hip  . PERIPHERAL VASCULAR CATHETERIZATION N/A 04/02/2015   Procedure: Visceral Angiography;  Surgeon: Algernon Huxley, MD;  Location: [place] CV LAB;  Service: Cardiovascular;  Laterality: N/A;  . PERIPHERAL VASCULAR CATHETERIZATION N/A 04/02/2015   Procedure: Visceral Artery Intervention;  Surgeon: Algernon Huxley, MD;  Location: [place] CV LAB;  Service: Cardiovascular;  Laterality: N/A;  . PERIPHERAL VASCULAR CATHETERIZATION N/A 05/25/2015   Procedure: IVC Filter Insertion;  Surgeon: Katha Cabal, MD;  Location: [place] CV LAB;  Service: Cardiovascular;  Laterality: N/A;  . TOE AMPUTATION     right  . TOTAL HIP ARTHROPLASTY      Prior to Admission medications   Medication Sig Start Date End Date Taking? Authorizing Provider  amLODipine (NORVASC) 5 MG tablet Take 5 mg by mouth daily.   Yes [provider]  aspirin EC 81 MG tablet Take 1 tablet (81 mg total) by mouth daily. 09/16/15  Yes [name], MD  cholecalciferol (VITAMIN D) 1000 units tablet Take 1,000 Units by mouth daily.   Yes [provider]  docusate sodium (COLACE) 100 MG capsule Take 100 mg by mouth 2 (two) times daily.    Yes [provider]  ELIQUIS 5 MG TABS tablet TAKE ONE TABLET BY MOUTH TWICE A DAY Patient taking differently: Take 5 mg by mouth 2 (two) times daily.  07/12/18  Yes Lequita Asal, MD  estradiol (ESTRACE VAGINAL) 0.1 MG/GM vaginal cream Apply 0.5mg  (pea-sized amount)  just inside the vaginal introitus with a finger-tip on Monday, Wednesday and Friday nights. 08/09/18  Yes McGowan, Larene Beach A, PA-C  folic acid (FOLVITE) 852 MCG tablet Take 400 mcg by mouth daily.   Yes [provider]  ipratropium-albuterol (DUONEB) 0.5-2.5 (3) MG/3ML SOLN Take 3 mLs by nebulization every 4 (four) hours as  needed (for shortness of breath/wheezing).    Yes [provider]  JAKAFI 15 MG tablet TAKE 1 TABLET (15 MG TOTAL) BY MOUTH 2 TIMES DAILY. Patient taking differently: Take 15 mg by mouth 2 (two) times daily.  09/06/18  Yes Karen Kitchens, NP  metoprolol tartrate (LOPRESSOR) 25 MG tablet Take 0.5 tablets (12.5 mg total) by mouth 2 (two) times daily. 09/01/18  Yes Max Sane, MD     Allergies Patient has no known allergies.  Family History  Problem Relation Age of Onset  . Brain cancer Mother   . Heart attack Father   . COPD Father   . Coronary artery disease Father   . Hypertension Unknown   . Arthritis-Osteo Unknown   . Diabetes Brother   . Arthritis Brother   . Osteosarcoma Son   . Bone cancer Son   . Arthritis Sister   . Arthritis Brother   . Hypertension Brother   . Coronary artery disease Brother   . Malignant hyperthermia Brother     Social History Social History   Tobacco Use  . Smoking status: Never Smoker  . Smokeless tobacco: Never Used  Substance Use Topics  .  Alcohol use: No  . Drug use: No    Review of Systems  Constitutional: No reports of fever Eyes: No discharge ENT: No neck pain Cardiovascular: Denies chest pain. Respiratory: Denies shortness of breath.  Cough Gastrointestinal: As above Genitourinary: Negative for dysuria. Musculoskeletal: No lower extremity edema Skin: Negative for pallor Neurological: Negative for headaches    ____________________________________________   PHYSICAL EXAM:  VITAL SIGNS: ED Triage Vitals  Enc Vitals Group     BP 09/09/18 0938 (!) 173/94     Pulse Rate 09/09/18 0938 91     Resp 09/09/18 0938 20     Temp 09/09/18 0938 98.6 F (37 C)     Temp Source 09/09/18 0938 Oral     SpO2 09/09/18 0936 98 %     Weight 09/09/18 0939 91.6 kg (201 lb 15.1 oz)     Height 09/09/18 0939 1.702 m (5\' 7" )     Head Circumference --      Peak Flow --      Pain Score 09/09/18 0939 0     Pain Loc --      Pain Edu?  --      Excl. in [place]? --     Constitutional: Alert Eyes: Conjunctivae are normal.   Nose: No congestion/rhinnorhea. Mouth/Throat: Mucous membranes are moist.    Cardiovascular: Normal rate, regular rhythm. Grossly normal heart sounds.  Good peripheral circulation. Respiratory: Normal respiratory effort.  No retractions. Lungs CTAB. Gastrointestinal: Soft and nontender. No distention.  Ostomy, pink, well-appearing, nontender, small amount of dark red blood in ostomy bag  Musculoskeletal:  Warm and well perfused Neurologic:  Normal speech and language. No gross focal neurologic deficits are appreciated.  Skin:  Skin is warm, dry and intact. No rash noted. Psychiatric: Mood and affect are normal.  ____________________________________________   LABS (all labs ordered are listed, but only abnormal results are displayed)  Labs Reviewed  COMPREHENSIVE METABOLIC PANEL - Abnormal; Notable for the following components:      Result Value   Albumin 3.0 (*)    All other components within normal limits  PROTIME-INR - Abnormal; Notable for the following components:   Prothrombin Time 17.5 (*)    All other components within normal limits  CBC - Abnormal; Notable for the following components:   RBC 3.72 (*)    Hemoglobin 9.3 (*)    HCT 32.2 (*)    MCH 25.0 (*)    MCHC 28.9 (*)    RDW 30.9 (*)    nRBC 4.3 (*)    All other components within normal limits  POC OCCULT BLOOD, ED  TYPE AND SCREEN   ____________________________________________  EKG  ED ECG REPORT I, Lavonia Drafts, the attending physician, personally viewed and interpreted this ECG.  Date: 09/09/2018  Rhythm: normal sinus rhythm QRS Axis: normal Intervals: Right bundle branch block ST/T Wave abnormalities: normal Narrative Interpretation: no evidence of acute ischemia  ____________________________________________  RADIOLOGY  None ____________________________________________   PROCEDURES  Procedure(s)  performed: No  Procedures   Critical Care performed: No ____________________________________________   INITIAL IMPRESSION / ASSESSMENT AND PLAN / ED COURSE  Pertinent labs & imaging results that were available during my care of the patient were reviewed by me and considered in my medical decision making (see chart for details).  Patient presents with bleeding into her ostomy.  She is on blood thinners.  Vital signs are reassuring, blood pressure stable.  Pending labs  Hemoglobin 9.3, this is minimally off of her baseline however  given her history the fact that she is on blood thinners and that she continues to bleed into her ostomy will admit to the hospital service for further evaluation    ____________________________________________   FINAL CLINICAL IMPRESSION(S) / ED DIAGNOSES  Final diagnoses:  Gastrointestinal hemorrhage, unspecified gastrointestinal hemorrhage type        Note:  This document was prepared using Dragon voice recognition software and may include unintentional dictation errors.    Lavonia Drafts, MD 09/09/18 1236

## 2018-09-09 NOTE — H&P (Signed)
[facility] [place] NAME: Yolanda Wells    MR#:  [id number]  DATE OF BIRTH:  05-Oct-1932  DATE OF ADMISSION:  09/09/2018  PRIMARY CARE PHYSICIAN: Yolanda Boys, MD   REQUESTING/REFERRING PHYSICIAN: Dr Yolanda Wells  CHIEF COMPLAINT:   Chief Complaint  Patient presents with  . GI Bleeding    HISTORY OF PRESENT ILLNESS:  Yolanda Wells  is a 84 y.o. female with a known history of stroke with left-sided weakness.  Recent hospitalization for urinary tract infection and fever.  As per granddaughter, yesterday afternoon around 230 she had an episode of diarrhea which overflowed the bag.  Around 530 had another loose stool with bright red blood and mucus.  This morning had more blood in the ostomy.  Patient feels okay and offers no complaints.  No shortness of breath or chest pain.  Recent hemoglobin was 10 and today's hemoglobin is 9.3.  Hospitalist services were contacted for evaluation.  PAST MEDICAL HISTORY:   Past Medical History:  Diagnosis Date  . Acute deep vein thrombosis (DVT) of distal vein of left lower extremity ([facility]) 10/03/2015  . Acute pulmonary embolism ([facility]) 10/03/2015  . Anticoagulant long-term use   . Arthritis   . Arthritis   . CHF (congestive heart failure) ([place])   . Chronic anticoagulation 10/03/2015  . Collagen vascular disease ([place])   . Colostomy in place [facility]) 10/04/2015  . Deep venous thrombosis ([facility])    right lower extremity  . Dependent edema   . Diverticulosis of intestine with bleeding 04/02/2015  . Glenohumeral arthritis 06/28/2012  . Hammertoe 09/02/2012  . History of bilateral hip replacements 09/02/2012  . History of hysterectomy   . Hypertension   . Hypertension   . Hypokalemia   . Inability to ambulate due to hip 10/12/2015  . Mandibular dysfunction   . Onychomycosis 09/02/2012  . Osteoarthritis   . Polycythemia vera ([place])   . Presence of IVC filter 05/25/2015  . Stroke ([place])  03/26/2015   cerebellar  . Urinary incontinence in female 09/02/2016    PAST SURGICAL HISTORY:   Past Surgical History:  Procedure Laterality Date  . ABDOMINAL HYSTERECTOMY    . BLEPHAROPLASTY Right 03/2017   right upper eyelid  . CARDIAC CATHETERIZATION Right 04/03/2015   Procedure: CENTRAL LINE INSERTION;  Surgeon: Sherri Rad, MD;  Location: [facility] ORS;  Service: General;  Laterality: Right;  . COLECTOMY WITH COLOSTOMY CREATION/HARTMANN PROCEDURE N/A 04/03/2015   Procedure: COLECTOMY WITH COLOSTOMY CREATION/HARTMANN PROCEDURE;  Surgeon: Sherri Rad, MD;  Location: [facility] ORS;  Service: General;  Laterality: N/A;  . COLON SURGERY    . FOOT AMPUTATION     partial  . FOOT AMPUTATION Right   . IVC FILTER INSERTION    . JOINT REPLACEMENT     Left and Right Hip  . PERIPHERAL VASCULAR CATHETERIZATION N/A 04/02/2015   Procedure: Visceral Angiography;  Surgeon: Algernon Huxley, MD;  Location: [place] CV LAB;  Service: Cardiovascular;  Laterality: N/A;  . PERIPHERAL VASCULAR CATHETERIZATION N/A 04/02/2015   Procedure: Visceral Artery Intervention;  Surgeon: Algernon Huxley, MD;  Location: [place] CV LAB;  Service: Cardiovascular;  Laterality: N/A;  . PERIPHERAL VASCULAR CATHETERIZATION N/A 05/25/2015   Procedure: IVC Filter Insertion;  Surgeon: Katha Cabal, MD;  Location: [place] CV LAB;  Service: Cardiovascular;  Laterality: N/A;  . TOE AMPUTATION     right  . TOTAL HIP ARTHROPLASTY      SOCIAL HISTORY:  Social History   Tobacco Use  . Smoking status: Never Smoker  . Smokeless tobacco: Never Used  Substance Use Topics  . Alcohol use: No    FAMILY HISTORY:   Family History  Problem Relation Age of Onset  . Brain cancer Mother   . Heart attack Father   . COPD Father   . Coronary artery disease Father   . Hypertension Unknown   . Arthritis-Osteo Unknown   . Diabetes Brother   . Arthritis Brother   . Osteosarcoma Son   . Bone cancer Son   . Arthritis Sister   .  Arthritis Brother   . Hypertension Brother   . Coronary artery disease Brother   . Malignant hyperthermia Brother     DRUG ALLERGIES:  No Known Allergies  REVIEW OF SYSTEMS:  CONSTITUTIONAL: No fever, fatigue.  Baseline left-sided weakness with stroke EYES: No blurred or double vision.  EARS, NOSE, AND THROAT: No tinnitus or ear pain. No sore throat RESPIRATORY: No cough, shortness of breath, wheezing or hemoptysis.  CARDIOVASCULAR: No chest pain, orthopnea, edema.  GASTROINTESTINAL: No nausea, vomiting, diarrhea or abdominal pain. No blood in bowel movements GENITOURINARY: No dysuria, hematuria.  ENDOCRINE: No polyuria, nocturia,  HEMATOLOGY: No anemia, easy bruising or bleeding SKIN: No rash or lesion. MUSCULOSKELETAL: No joint pain or arthritis.   NEUROLOGIC: Baseline left-sided weakness with prior stroke PSYCHIATRY: No anxiety or depression.   MEDICATIONS AT HOME:   Prior to Admission medications   Medication Sig Start Date End Date Taking? Authorizing Provider  amLODipine (NORVASC) 5 MG tablet Take 5 mg by mouth daily.   Yes [provider]  aspirin EC 81 MG tablet Take 1 tablet (81 mg total) by mouth daily. 09/16/15  Yes [name], MD  cholecalciferol (VITAMIN D) 1000 units tablet Take 1,000 Units by mouth daily.   Yes [provider]  docusate sodium (COLACE) 100 MG capsule Take 100 mg by mouth 2 (two) times daily.    Yes [provider]  ELIQUIS 5 MG TABS tablet TAKE ONE TABLET BY MOUTH TWICE A DAY Patient taking differently: Take 5 mg by mouth 2 (two) times daily.  07/12/18  Yes Lequita Asal, MD  estradiol (ESTRACE VAGINAL) 0.1 MG/GM vaginal cream Apply 0.5mg  (pea-sized amount)  just inside the vaginal introitus with a finger-tip on Monday, Wednesday and Friday nights. 08/09/18  Yes McGowan, Larene Beach A, PA-C  folic acid (FOLVITE) 494 MCG tablet Take 400 mcg by mouth daily.   Yes [provider]  ipratropium-albuterol (DUONEB)  0.5-2.5 (3) MG/3ML SOLN Take 3 mLs by nebulization every 4 (four) hours as needed (for shortness of breath/wheezing).    Yes [provider]  JAKAFI 15 MG tablet TAKE 1 TABLET (15 MG TOTAL) BY MOUTH 2 TIMES DAILY. Patient taking differently: Take 15 mg by mouth 2 (two) times daily.  09/06/18  Yes Karen Kitchens, NP  metoprolol tartrate (LOPRESSOR) 25 MG tablet Take 0.5 tablets (12.5 mg total) by mouth 2 (two) times daily. 09/01/18  Yes Max Sane, MD      VITAL SIGNS:  Blood pressure (!) 151/79, pulse 73, temperature 98.6 F (37 C), temperature source Oral, resp. rate 18, height 5\' 7"  (1.702 m), weight 91.6 kg, SpO2 99 %.  PHYSICAL EXAMINATION:  GENERAL:  84 y.o.-year-old patient lying in the bed with no acute distress.  EYES: Pupils equal, round, reactive to light and accommodation. No scleral icterus. Extraocular muscles intact.  HEENT: Head atraumatic, normocephalic. Oropharynx and nasopharynx clear.  NECK:  Supple, no jugular venous distention. No thyroid enlargement, no tenderness.  LUNGS: Normal breath sounds bilaterally, no wheezing, rales,rhonchi or crepitation. No use of accessory muscles of respiration.  CARDIOVASCULAR: S1, S2 normal. No murmurs, rubs, or gallops.  ABDOMEN: Soft, nontender, nondistended. Bowel sounds present. No organomegaly or mass.  EXTREMITIES: 2+ pedal edema, no cyanosis, or clubbing.  NEUROLOGIC: Cranial nerves II through XII are intact.  Unable to straight leg raise with the left leg.  Barely able to straight leg raise with the right leg.  Sensation intact. Gait not checked.  PSYCHIATRIC: The patient is alert and answers questions.  SKIN: No rash, lesion, or ulcer.  Prior amputation of toes on the right foot.    LABORATORY PANEL:   CBC Recent Labs  Lab 09/09/18 1058  WBC 10.1  HGB 9.3*  HCT 32.2*  PLT 371   ------------------------------------------------------------------------------------------------------------------  Chemistries   Recent Labs  Lab 09/09/18 0943  NA 142  K 4.0  CL 102  CO2 32  GLUCOSE 99  BUN 9  CREATININE 0.57  CALCIUM 9.0  AST 23  ALT 9  ALKPHOS 65  BILITOT 0.7   ------------------------------------------------------------------------------------------------------------------    IMPRESSION AND PLAN:   1.  Lower GI bleed, acute blood loss anemia.  Stop aspirin and Eliquis.  GI consultation.  Serial hemoglobins.  Transfuse if needed.  If really starts hemorrhaging I can order a bleeding scan.  Since had diarrhea to start this episode I will send a stool for C. difficile to rule out infection. 2.  Essential hypertension on Norvasc 3.  Polycythemia vera Jakifi 4.  Previous stroke with chronic left-sided weakness 5.  History of DVT and PE.  Eliquis on hold.  I will get an ultrasound of the lower extremity to rule out DVT.  Granddaughter states that she has had a IVC filter in the past.   All the records are reviewed and case discussed with ED provider. Management plans discussed with the patient, family and they are in agreement.  CODE STATUS: Full code  TOTAL TIME TAKING CARE OF THIS PATIENT: 50 minutes, including ACP time.    Loletha Grayer M.D on 09/09/2018 at 12:50 PM  Between 7am to 6pm - Pager - [phone]  After 6pm call admission pager [phone]  [facility] Office  [phone]  CC: Primary care physician; Yolanda Boys, MD

## 2018-09-09 NOTE — Progress Notes (Signed)
Patient ID: Shyenne Maggard, female   DOB: Mar 21, 1932, 84 y.o.   MRN: [id number]  ACP note  Patient present Spoke with granddaughter on the phone  Diagnosis: Lower GI bleed, acute blood loss anemia, essential hypertension, polycythemia vera, history of stroke with chronic left-sided weakness, history of DVT and PE  CODE STATUS discussed and patient is a full code.  Plan.  Hold aspirin and Eliquis.  Serial hemoglobins.  GI consultation.  Watch closely in the hospital for few days.  Time spent on ACP discussion 17 minutes Dr. Loletha Grayer

## 2018-09-10 ENCOUNTER — Other Ambulatory Visit: Payer: Self-pay

## 2018-09-10 DIAGNOSIS — K9401 Colostomy hemorrhage: Secondary | ICD-10-CM

## 2018-09-10 LAB — CBC
HCT: 30.6 % — ABNORMAL LOW (ref 36.0–46.0)
Hemoglobin: 8.7 g/dL — ABNORMAL LOW (ref 12.0–15.0)
MCH: 24.7 pg — AB (ref 26.0–34.0)
MCHC: 28.4 g/dL — ABNORMAL LOW (ref 30.0–36.0)
MCV: 86.9 fL (ref 80.0–100.0)
PLATELETS: 343 10*3/uL (ref 150–400)
RBC: 3.52 MIL/uL — AB (ref 3.87–5.11)
RDW: 30.9 % — AB (ref 11.5–15.5)
WBC: 10.5 10*3/uL (ref 4.0–10.5)
nRBC: 3.7 % — ABNORMAL HIGH (ref 0.0–0.2)

## 2018-09-10 LAB — BASIC METABOLIC PANEL
Anion gap: 3 — ABNORMAL LOW (ref 5–15)
BUN: 7 mg/dL — ABNORMAL LOW (ref 8–23)
CALCIUM: 8.6 mg/dL — AB (ref 8.9–10.3)
CHLORIDE: 104 mmol/L (ref 98–111)
CO2: 35 mmol/L — AB (ref 22–32)
CREATININE: 0.48 mg/dL (ref 0.44–1.00)
GFR calc Af Amer: >60 mL/min (ref >60)
GFR calc non Af Amer: >60 mL/min (ref >60)
GLUCOSE: 88 mg/dL (ref 70–99)
Potassium: 4 mmol/L (ref 3.5–5.1)
Sodium: 142 mmol/L (ref 135–145)

## 2018-09-10 LAB — VITAMIN B12: Vitamin B-12: 231 pg/mL (ref 180–914)

## 2018-09-10 NOTE — Plan of Care (Signed)
  Problem: Activity: Goal: Risk for activity intolerance will decrease Outcome: Progressing   

## 2018-09-10 NOTE — Progress Notes (Signed)
[facility] [place] NAME: Yolanda Wells    MR#:  [id number]  DATE OF BIRTH:  Dec 26, 1931  SUBJECTIVE:  CHIEF COMPLAINT:   Chief Complaint  Patient presents with  . GI Bleeding   -No further bleeding is noted today.  Hemoglobin down to 8.7 -Remains on clear liquid diet.  Last dose of Eliquis was on 09/08/2018 evening dose  REVIEW OF SYSTEMS:  Review of Systems  Constitutional: Negative for chills, fever and malaise/fatigue.  HENT: Negative for congestion, ear discharge, hearing loss and nosebleeds.   Eyes: Positive for blurred vision.  Respiratory: Negative for cough, shortness of breath and wheezing.   Cardiovascular: Positive for leg swelling. Negative for chest pain and palpitations.  Gastrointestinal: Positive for abdominal pain and blood in stool. Negative for constipation, diarrhea, nausea and vomiting.  Genitourinary: Negative for dysuria.  Neurological: Negative for dizziness, focal weakness, seizures, weakness and headaches.  Psychiatric/Behavioral: Negative for depression.    DRUG ALLERGIES:  No Known Allergies  VITALS:  Blood pressure 129/64, pulse 70, temperature 98 F (36.7 C), temperature source Oral, resp. rate 16, height 5\' 7"  (1.702 m), weight 91.6 kg, SpO2 100 %.  PHYSICAL EXAMINATION:  Physical Exam  GENERAL:  84 y.o.-year-old patient lying in the bed with no acute distress.  EYES: Pupils equal, round, reactive to light and accommodation. No scleral icterus. Extraocular muscles intact.  HEENT: Head atraumatic, normocephalic. Oropharynx and nasopharynx clear.  NECK:  Supple, no jugular venous distention. No thyroid enlargement, no tenderness.  LUNGS: Normal breath sounds bilaterally, no wheezing, rales,rhonchi or crepitation. No use of accessory muscles of respiration.  Decreased bibasilar breath sounds CARDIOVASCULAR: S1, S2 normal. No murmurs, rubs, or gallops.  ABDOMEN: Soft, nontender, nondistended. Bowel  sounds present.  Right lower quadrant colostomy bag in place with no stool.  No organomegaly or mass.  EXTREMITIES: No  cyanosis, or clubbing. 1+ bilateral lower extremity edema noted NEUROLOGIC: Cranial nerves II through XII are intact. Muscle strength 5/5 in both upper extremities and 3/5 in RLE and 2/5 in LLE. Sensation intact. Gait not checked. Global weakness noted.  PSYCHIATRIC: The patient is alert and oriented x 3.  SKIN: No obvious rash, lesion, or ulcer.    LABORATORY PANEL:   CBC Recent Labs  Lab 09/10/18 0409  WBC 10.5  HGB 8.7*  HCT 30.6*  PLT 343   ------------------------------------------------------------------------------------------------------------------  Chemistries  Recent Labs  Lab 09/09/18 0943 09/10/18 0409  NA 142 142  K 4.0 4.0  CL 102 104  CO2 32 35*  GLUCOSE 99 88  BUN 9 7*  CREATININE 0.57 0.48  CALCIUM 9.0 8.6*  AST 23  --   ALT 9  --   ALKPHOS 65  --   BILITOT 0.7  --    ------------------------------------------------------------------------------------------------------------------  Cardiac Enzymes No results for input(s): TROPONINI in the last 168 hours. ------------------------------------------------------------------------------------------------------------------  RADIOLOGY:  US Venous Img Lower Bilateral  Result Date: 09/09/2018 CLINICAL DATA:  Lower extremity edema and history of prior bilateral lower extremity DVT EXAM: BILATERAL LOWER EXTREMITY VENOUS DOPPLER ULTRASOUND TECHNIQUE: Gray-scale sonography with graded compression, as well as color Doppler and duplex ultrasound were performed to evaluate the lower extremity deep venous systems from the level of the common femoral vein and including the common femoral, femoral, profunda femoral, popliteal and calf veins including the posterior tibial, peroneal and gastrocnemius veins when visible. The superficial great saphenous vein was also interrogated. Spectral Doppler was  utilized to evaluate flow at  rest and with distal augmentation maneuvers in the common femoral, femoral and popliteal veins. COMPARISON:  05/18/2015 and 02/05/2018 FINDINGS: RIGHT LOWER EXTREMITY Common Femoral Vein: No evidence of thrombus. Normal compressibility, respiratory phasicity and response to augmentation. Saphenofemoral Junction: No evidence of thrombus. Normal compressibility and flow on color Doppler imaging. Profunda Femoral Vein: No evidence of thrombus. Normal compressibility and flow on color Doppler imaging. Femoral Vein: The mid to distal right femoral vein in the thigh demonstrates a mild amount echogenic mural thrombus consistent with chronic thrombus. This does not cause any significant luminal stenosis or flow limitation. Popliteal Vein: Distal aspect of the right popliteal vein demonstrates minimal wall thickening without flow limitation or significant stenosis. This likely is the residua prior DVT. Calf Veins: Probable minimal amount of chronic thrombus in the visualized right posterior tibial vein. Peroneal vein shows normal patency. Superficial Great Saphenous Vein: No evidence of thrombus. Normal compressibility. Venous Reflux:  None. Other Findings: No evidence of superficial thrombophlebitis or abnormal fluid collection. LEFT LOWER EXTREMITY Common Femoral Vein: There is some chronic and stable appearing nonocclusive mural thrombus in the common femoral vein causing some luminal narrowing but no significant flow limitation. Saphenofemoral Junction: No evidence of thrombus. Normal compressibility and flow on color Doppler imaging. Profunda Femoral Vein: No evidence of thrombus. Normal compressibility and flow on color Doppler imaging. Femoral Vein: Mild echogenic mural irregularity in the left femoral vein is consistent with mild chronic thrombus related to prior DVT. This does not cause significant flow limitation or luminal stenosis. Popliteal Vein: No evidence of thrombus. Normal  compressibility, respiratory phasicity and response to augmentation. Calf Veins: No evidence of thrombus. Normal compressibility and flow on color Doppler imaging. Superficial Great Saphenous Vein: No evidence of thrombus. Normal compressibility. Venous Reflux:  None. Other Findings: No evidence of superficial thrombophlebitis or abnormal fluid collection. IMPRESSION: Mild amount of chronic thrombus in the right femoral and popliteal veins and likely right posterior tibial vein. Mild amount of chronic thrombus in the left common femoral and femoral veins. No significant flow limitation or luminal stenosis. No acute appearing thrombus identified. Electronically Signed   By: Aletta Edouard M.D.   On: 09/09/2018 15:39    EKG:   Orders placed or performed during the hospital encounter of 09/09/18  . ED EKG  . 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  1.  GI bleed-bright red stool in the blood.  Last colonoscopy was at the time of her ischemic colitis in 2016 -No active bleeding now.  Drop in hemoglobin to 8.7. -Appreciate GI consult.  Continue clear liquid diet.  Might benefit from EGD and colonoscopy this admission.  Again last dose of Eliquis was the evening dose of 09/08/2018  2.  Hypertension-on Norvasc and metoprolol  3.  History of DVT and PE-Dopplers of lower extremities showing evidence of chronic clot, she is status post IVC filter -Eliquis on hold -Most recent CT angios negative for any pulmonary embolism.  4.  Polycythemia-follows up with oncology as outpatient.  Currently on Jakafi orally  5.  DVT prophylaxis-tests and SCDs only due to GI bleed  Patient is wheelchair-bound  at baseline   Granddaughter [name] updated over the phone today  All the records are reviewed and case discussed with Care Management/Social Workerr. Management plans discussed with the patient, family and they are in agreement.  CODE STATUS: Full code  TOTAL TIME TAKING CARE OF THIS PATIENT: 38 minutes.   POSSIBLE D/C IN 2-3 DAYS, DEPENDING ON CLINICAL CONDITION.   [name] M.D on 09/10/2018 at 10:47 AM  Between 7am to 6pm - Pager - [phone]  After 6pm go to [email or web address] - password [email or web address] [email or web address]  Office  [phone]  CC: Primary care physician; [name], MD

## 2018-09-10 NOTE — Progress Notes (Signed)
Received verbal order to discontinue patient's enteric contact precautions from Dr. Tressia Miners. Patient has not had loose stool this shift.

## 2018-09-10 NOTE — Plan of Care (Signed)
  Problem: Education: Goal: Knowledge of General Education information will improve Description Including pain rating scale, medication(s)/side effects and non-pharmacologic comfort measures Outcome: Progressing   Problem: Health Behavior/Discharge Planning: Goal: Ability to manage health-related needs will improve Outcome: Progressing   Problem: Clinical Measurements: Goal: Ability to maintain clinical measurements within normal limits will improve Outcome: Progressing Goal: Will remain free from infection Outcome: Progressing Goal: Diagnostic test results will improve Outcome: Progressing Goal: Respiratory complications will improve Outcome: Progressing Goal: Cardiovascular complication will be avoided Outcome: Progressing   Problem: Activity: Goal: Risk for activity intolerance will decrease Outcome: Progressing   Problem: Skin Integrity: Goal: Risk for impaired skin integrity will decrease Outcome: Progressing   Problem: Education: Goal: Knowledge of General Education information will improve Description Including pain rating scale, medication(s)/side effects and non-pharmacologic comfort measures Outcome: Progressing   Problem: Health Behavior/Discharge Planning: Goal: Ability to manage health-related needs will improve Outcome: Progressing   Problem: Clinical Measurements: Goal: Ability to maintain clinical measurements within normal limits will improve Outcome: Progressing Goal: Will remain free from infection Outcome: Progressing Goal: Diagnostic test results will improve Outcome: Progressing Goal: Respiratory complications will improve Outcome: Progressing Goal: Cardiovascular complication will be avoided Outcome: Progressing   Problem: Activity: Goal: Risk for activity intolerance will decrease Outcome: Progressing   Problem: Nutrition: Goal: Adequate nutrition will be maintained Outcome: Progressing   Problem: Elimination: Goal: Will not  experience complications related to urinary retention Outcome: Progressing   Problem: Safety: Goal: Ability to remain free from injury will improve Outcome: Progressing   Problem: Skin Integrity: Goal: Risk for impaired skin integrity will decrease Outcome: Progressing

## 2018-09-10 NOTE — Progress Notes (Signed)
Cephas Darby, MD [address]  [place]  [place], [place] [address]  Main: [phone]  Fax: [phone] Pager: [phone]   Subjective: Patient is more alert and awake today, watching TV.  She has been tolerating clear liquid diet.  She denies any abdominal pain.  Objective: Vital signs in last 24 hours: Vitals:   09/09/18 1554 09/09/18 1921 09/10/18 0425 09/10/18 1014  BP: 137/72 139/70 136/69 129/64  Pulse: 72 72 63 70  Resp: 20 18  16   Temp: 98 F (36.7 C) 98 F (36.7 C) 98.2 F (36.8 C) 98 F (36.7 C)  TempSrc: Oral Oral Oral Oral  SpO2: 100% 100% 100% 100%  Weight:      Height:       Weight change:   Intake/Output Summary (Last 24 hours) at 09/10/2018 1723 Last data filed at 09/10/2018 0430 Gross per 24 hour  Intake -  Output 900 ml  Net -900 ml     Exam: Heart:: Regular rate and rhythm or S1S2 present Lungs: normal and clear to auscultation Abdomen: soft, nontender, normal bowel sounds Semisolid, dark green stool in the colostomy bag.  There was no fresh or old blood in the colostomy bag  Lab Results: CBC Latest Ref Rng & Units 09/10/2018 09/09/2018 09/09/2018  WBC 4.0 - 10.5 K/uL 10.5 - 10.1  Hemoglobin 12.0 - 15.0 g/dL 8.7(L) 9.1(L) 9.3(L)  Hematocrit 36.0 - 46.0 % 30.6(L) - 32.2(L)  Platelets 150 - 400 K/uL 343 - 371   CMP Latest Ref Rng & Units 09/10/2018 09/09/2018 08/30/2018  Glucose 70 - 99 mg/dL 88 99 96  BUN 8 - 23 mg/dL 7(L) 9 10  Creatinine 0.44 - 1.00 mg/dL 0.48 0.57 0.52  Sodium 135 - 145 mmol/L 142 142 138  Potassium 3.5 - 5.1 mmol/L 4.0 4.0 3.9  Chloride 98 - 111 mmol/L 104 102 104  CO2 22 - 32 mmol/L 35(H) 32 31  Calcium 8.9 - 10.3 mg/dL 8.6(L) 9.0 8.4(L)  Total Protein 6.5 - 8.1 g/dL - 6.9 6.4(L)  Total Bilirubin 0.3 - 1.2 mg/dL - 0.7 0.7  Alkaline Phos 38 - 126 U/L - 65 61  AST 15 - 41 U/L - 23 17  ALT 0 - 44 U/L - 9 11   Micro Results: No results found for this or any previous visit (from the past 240  hour(s)). Studies/Results: US Venous Img Lower Bilateral  Result Date: 09/09/2018 CLINICAL DATA:  Lower extremity edema and history of prior bilateral lower extremity DVT EXAM: BILATERAL LOWER EXTREMITY VENOUS DOPPLER ULTRASOUND TECHNIQUE: Gray-scale sonography with graded compression, as well as color Doppler and duplex ultrasound were performed to evaluate the lower extremity deep venous systems from the level of the common femoral vein and including the common femoral, femoral, profunda femoral, popliteal and calf veins including the posterior tibial, peroneal and gastrocnemius veins when visible. The superficial great saphenous vein was also interrogated. Spectral Doppler was utilized to evaluate flow at rest and with distal augmentation maneuvers in the common femoral, femoral and popliteal veins. COMPARISON:  05/18/2015 and 02/05/2018 FINDINGS: RIGHT LOWER EXTREMITY Common Femoral Vein: No evidence of thrombus. Normal compressibility, respiratory phasicity and response to augmentation. Saphenofemoral Junction: No evidence of thrombus. Normal compressibility and flow on color Doppler imaging. Profunda Femoral Vein: No evidence of thrombus. Normal compressibility and flow on color Doppler imaging. Femoral Vein: The mid to distal right femoral vein in the thigh demonstrates a mild amount echogenic mural thrombus consistent with chronic thrombus. This  does not cause any significant luminal stenosis or flow limitation. Popliteal Vein: Distal aspect of the right popliteal vein demonstrates minimal wall thickening without flow limitation or significant stenosis. This likely is the residua prior DVT. Calf Veins: Probable minimal amount of chronic thrombus in the visualized right posterior tibial vein. Peroneal vein shows normal patency. Superficial Great Saphenous Vein: No evidence of thrombus. Normal compressibility. Venous Reflux:  None. Other Findings: No evidence of superficial thrombophlebitis or abnormal  fluid collection. LEFT LOWER EXTREMITY Common Femoral Vein: There is some chronic and stable appearing nonocclusive mural thrombus in the common femoral vein causing some luminal narrowing but no significant flow limitation. Saphenofemoral Junction: No evidence of thrombus. Normal compressibility and flow on color Doppler imaging. Profunda Femoral Vein: No evidence of thrombus. Normal compressibility and flow on color Doppler imaging. Femoral Vein: Mild echogenic mural irregularity in the left femoral vein is consistent with mild chronic thrombus related to prior DVT. This does not cause significant flow limitation or luminal stenosis. Popliteal Vein: No evidence of thrombus. Normal compressibility, respiratory phasicity and response to augmentation. Calf Veins: No evidence of thrombus. Normal compressibility and flow on color Doppler imaging. Superficial Great Saphenous Vein: No evidence of thrombus. Normal compressibility. Venous Reflux:  None. Other Findings: No evidence of superficial thrombophlebitis or abnormal fluid collection. IMPRESSION: Mild amount of chronic thrombus in the right femoral and popliteal veins and likely right posterior tibial vein. Mild amount of chronic thrombus in the left common femoral and femoral veins. No significant flow limitation or luminal stenosis. No acute appearing thrombus identified. Electronically Signed   By: Aletta Edouard M.D.   On: 09/09/2018 15:39   Medications: I have reviewed the patient's current medications. Scheduled Meds: . amLODipine  5 mg Oral Daily  . estradiol  1 Applicatorful Vaginal Once per day on Mon Wed Fri  . folic acid  0.5 mg Oral Daily  . metoprolol tartrate  12.5 mg Oral BID  . pantoprazole  40 mg Oral Daily  . ruxolitinib phosphate  15 mg Oral BID   Continuous Infusions: PRN Meds:.acetaminophen **OR** acetaminophen, ipratropium-albuterol, ondansetron **OR** ondansetron (ZOFRAN) IV   Assessment: Active Problems:   Lower GI  bleed  Minimal bleeding in the colostomy bag.  Most likely secondary to stomal irritation in the setting of anticoagulation Hemoglobin is slowly downtrending but there is no evidence of active GI bleed  Plan: No indication to perform colonoscopy at this time unless pt develops brisk bleed from colostomy Advance diet Resume anticoagulation from GI standpoint  GI will sign off, please call back with questions   LOS: 1 day   Opie Maclaughlin 09/10/2018, 5:23 PM

## 2018-09-11 ENCOUNTER — Inpatient Hospital Stay: Payer: Medicare Other

## 2018-09-11 LAB — CBC
HCT: 36.7 % (ref 36.0–46.0)
HEMOGLOBIN: 10.4 g/dL — AB (ref 12.0–15.0)
MCH: 24.8 pg — AB (ref 26.0–34.0)
MCHC: 28.3 g/dL — ABNORMAL LOW (ref 30.0–36.0)
MCV: 87.4 fL (ref 80.0–100.0)
Platelets: 391 10*3/uL (ref 150–400)
RBC: 4.2 MIL/uL (ref 3.87–5.11)
RDW: 30.2 % — ABNORMAL HIGH (ref 11.5–15.5)
WBC: 17.1 10*3/uL — ABNORMAL HIGH (ref 4.0–10.5)
nRBC: 3.6 % — ABNORMAL HIGH (ref 0.0–0.2)

## 2018-09-11 LAB — BASIC METABOLIC PANEL
ANION GAP: 6 (ref 5–15)
BUN: 6 mg/dL — ABNORMAL LOW (ref 8–23)
CALCIUM: 8.2 mg/dL — AB (ref 8.9–10.3)
CO2: 33 mmol/L — AB (ref 22–32)
Chloride: 103 mmol/L (ref 98–111)
Creatinine, Ser: 0.5 mg/dL (ref 0.44–1.00)
GFR calc Af Amer: >60 mL/min (ref >60)
GFR calc non Af Amer: >60 mL/min (ref >60)
GLUCOSE: 86 mg/dL (ref 70–99)
POTASSIUM: 4 mmol/L (ref 3.5–5.1)
Sodium: 142 mmol/L (ref 135–145)

## 2018-09-11 LAB — CBC WITH DIFFERENTIAL/PLATELET
ABS IMMATURE GRANULOCYTES: 2.76 10*3/uL — AB (ref 0.00–0.07)
BASOS ABS: 0.1 10*3/uL (ref 0.0–0.1)
Basophils Relative: 1 %
EOS PCT: 1 %
Eosinophils Absolute: 0.1 10*3/uL (ref 0.0–0.5)
HCT: 34.5 % — ABNORMAL LOW (ref 36.0–46.0)
Hemoglobin: 10.1 g/dL — ABNORMAL LOW (ref 12.0–15.0)
Immature Granulocytes: 17 %
LYMPHS ABS: 1.7 10*3/uL (ref 0.7–4.0)
Lymphocytes Relative: 10 %
MCH: 25.7 pg — AB (ref 26.0–34.0)
MCHC: 29.3 g/dL — ABNORMAL LOW (ref 30.0–36.0)
MCV: 87.8 fL (ref 80.0–100.0)
MONO ABS: 0.8 10*3/uL (ref 0.1–1.0)
Monocytes Relative: 5 %
NEUTROS ABS: 11.3 10*3/uL — AB (ref 1.7–7.7)
NRBC: 3.4 % — AB (ref 0.0–0.2)
Neutrophils Relative %: 66 %
PLATELETS: UNDETERMINED 10*3/uL (ref 150–400)
RBC: 3.93 MIL/uL (ref 3.87–5.11)
RDW: 28.4 % — AB (ref 11.5–15.5)
WBC: 16.7 10*3/uL — ABNORMAL HIGH (ref 4.0–10.5)

## 2018-09-11 MED ORDER — PANTOPRAZOLE SODIUM 40 MG PO TBEC: 40.0000 mg | DELAYED_RELEASE_TABLET | Freq: Every day | ORAL | 0 refills | Status: AC

## 2018-09-11 MED ORDER — FERROUS SULFATE 325 (65 FE) MG PO TABS: 325.0000 mg | ORAL_TABLET | Freq: Every day | ORAL | 2 refills | Status: DC

## 2018-09-11 MED ORDER — IOHEXOL 300 MG/ML  SOLN
30.0000 mL | Freq: Once | INTRAMUSCULAR | Status: AC | PRN
  Administered 2018-09-11: 17:00:00 30 mL via ORAL

## 2018-09-11 MED ORDER — IOPAMIDOL (ISOVUE-300) INJECTION 61%
100.0000 mL | Freq: Once | INTRAVENOUS | Status: AC | PRN
  Administered 2018-09-11: 20:00:00 100 mL via INTRAVENOUS

## 2018-09-11 NOTE — Plan of Care (Signed)
  Problem: Education: Goal: Knowledge of General Education information will improve Description Including pain rating scale, medication(s)/side effects and non-pharmacologic comfort measures Outcome: Progressing   Problem: Health Behavior/Discharge Planning: Goal: Ability to manage health-related needs will improve Outcome: Progressing   Problem: Clinical Measurements: Goal: Ability to maintain clinical measurements within normal limits will improve Outcome: Progressing Goal: Will remain free from infection Outcome: Progressing Goal: Diagnostic test results will improve Outcome: Progressing Goal: Respiratory complications will improve Outcome: Progressing Goal: Cardiovascular complication will be avoided Outcome: Progressing   Problem: Activity: Goal: Risk for activity intolerance will decrease Outcome: Progressing   Problem: Skin Integrity: Goal: Risk for impaired skin integrity will decrease Outcome: Progressing   Problem: Education: Goal: Knowledge of General Education information will improve Description Including pain rating scale, medication(s)/side effects and non-pharmacologic comfort measures Outcome: Progressing   Problem: Health Behavior/Discharge Planning: Goal: Ability to manage health-related needs will improve Outcome: Progressing   Problem: Clinical Measurements: Goal: Ability to maintain clinical measurements within normal limits will improve Outcome: Progressing Goal: Will remain free from infection Outcome: Progressing Goal: Diagnostic test results will improve Outcome: Progressing Goal: Respiratory complications will improve Outcome: Progressing Goal: Cardiovascular complication will be avoided Outcome: Progressing   Problem: Activity: Goal: Risk for activity intolerance will decrease Outcome: Progressing   Problem: Nutrition: Goal: Adequate nutrition will be maintained Outcome: Progressing   Problem: Elimination: Goal: Will not  experience complications related to urinary retention Outcome: Progressing   Problem: Safety: Goal: Ability to remain free from injury will improve Outcome: Progressing   Problem: Skin Integrity: Goal: Risk for impaired skin integrity will decrease Outcome: Progressing

## 2018-09-11 NOTE — Progress Notes (Signed)
[place] [facility] [place] NAME: Yolanda Wells    MR#:  [id number]  DATE OF BIRTH:  Mar 30, 1932  SUBJECTIVE:  CHIEF COMPLAINT:   Chief Complaint  Patient presents with  . GI Bleeding   -No Complaints.  No further bloody stools. -Hemoglobin is pending  REVIEW OF SYSTEMS:  Review of Systems  Constitutional: Negative for chills, fever and malaise/fatigue.  HENT: Negative for congestion, ear discharge, hearing loss and nosebleeds.   Eyes: Positive for blurred vision.  Respiratory: Negative for cough, shortness of breath and wheezing.   Cardiovascular: Positive for leg swelling. Negative for chest pain and palpitations.  Gastrointestinal: Positive for abdominal pain and blood in stool. Negative for constipation, diarrhea, nausea and vomiting.  Genitourinary: Negative for dysuria.  Neurological: Negative for dizziness, focal weakness, seizures, weakness and headaches.  Psychiatric/Behavioral: Negative for depression.    DRUG ALLERGIES:  No Known Allergies  VITALS:  Blood pressure 133/67, pulse 89, temperature 98.6 F (37 C), temperature source Oral, resp. rate 16, height 5\' 7"  (1.702 m), weight 91.6 kg, SpO2 92 %.  PHYSICAL EXAMINATION:  Physical Exam  GENERAL:  84 y.o.-year-old patient lying in the bed with no acute distress.  EYES: Pupils equal, round, reactive to light and accommodation. No scleral icterus. Extraocular muscles intact.  HEENT: Head atraumatic, normocephalic. Oropharynx and nasopharynx clear.  NECK:  Supple, no jugular venous distention. No thyroid enlargement, no tenderness.  LUNGS: Normal breath sounds bilaterally, no wheezing, rales,rhonchi or crepitation. No use of accessory muscles of respiration.  Decreased bibasilar breath sounds CARDIOVASCULAR: S1, S2 normal. No murmurs, rubs, or gallops.  ABDOMEN: Soft, nontender, nondistended. Bowel sounds present.  Right lower quadrant colostomy bag in place with no stool.  No  organomegaly or mass.  EXTREMITIES: No  cyanosis, or clubbing. 1+ bilateral lower extremity edema noted NEUROLOGIC: Cranial nerves II through XII are intact. Muscle strength 5/5 in both upper extremities and 3/5 in RLE and 2/5 in LLE. Sensation intact. Gait not checked. Global weakness noted.  PSYCHIATRIC: The patient is alert and oriented x 3.  SKIN: No obvious rash, lesion, or ulcer.    LABORATORY PANEL:   CBC Recent Labs  Lab 09/10/18 0409  WBC 10.5  HGB 8.7*  HCT 30.6*  PLT 343   ------------------------------------------------------------------------------------------------------------------  Chemistries  Recent Labs  Lab 09/09/18 0943  09/11/18 0722  NA 142   < > 142  K 4.0   < > 4.0  CL 102   < > 103  CO2 32   < > 33*  GLUCOSE 99   < > 86  BUN 9   < > 6*  CREATININE 0.57   < > 0.50  CALCIUM 9.0   < > 8.2*  AST 23  --   --   ALT 9  --   --   ALKPHOS 65  --   --   BILITOT 0.7  --   --    < > = values in this interval not displayed.   ------------------------------------------------------------------------------------------------------------------  Cardiac Enzymes No results for input(s): TROPONINI in the last 168 hours. ------------------------------------------------------------------------------------------------------------------  RADIOLOGY:  US Venous Img Lower Bilateral  Result Date: 09/09/2018 CLINICAL DATA:  Lower extremity edema and history of prior bilateral lower extremity DVT EXAM: BILATERAL LOWER EXTREMITY VENOUS DOPPLER ULTRASOUND TECHNIQUE: Gray-scale sonography with graded compression, as well as color Doppler and duplex ultrasound were performed to evaluate the lower extremity deep venous systems from the level of the common femoral  vein and including the common femoral, femoral, profunda femoral, popliteal and calf veins including the posterior tibial, peroneal and gastrocnemius veins when visible. The superficial great saphenous vein was also  interrogated. Spectral Doppler was utilized to evaluate flow at rest and with distal augmentation maneuvers in the common femoral, femoral and popliteal veins. COMPARISON:  05/18/2015 and 02/05/2018 FINDINGS: RIGHT LOWER EXTREMITY Common Femoral Vein: No evidence of thrombus. Normal compressibility, respiratory phasicity and response to augmentation. Saphenofemoral Junction: No evidence of thrombus. Normal compressibility and flow on color Doppler imaging. Profunda Femoral Vein: No evidence of thrombus. Normal compressibility and flow on color Doppler imaging. Femoral Vein: The mid to distal right femoral vein in the thigh demonstrates a mild amount echogenic mural thrombus consistent with chronic thrombus. This does not cause any significant luminal stenosis or flow limitation. Popliteal Vein: Distal aspect of the right popliteal vein demonstrates minimal wall thickening without flow limitation or significant stenosis. This likely is the residua prior DVT. Calf Veins: Probable minimal amount of chronic thrombus in the visualized right posterior tibial vein. Peroneal vein shows normal patency. Superficial Great Saphenous Vein: No evidence of thrombus. Normal compressibility. Venous Reflux:  None. Other Findings: No evidence of superficial thrombophlebitis or abnormal fluid collection. LEFT LOWER EXTREMITY Common Femoral Vein: There is some chronic and stable appearing nonocclusive mural thrombus in the common femoral vein causing some luminal narrowing but no significant flow limitation. Saphenofemoral Junction: No evidence of thrombus. Normal compressibility and flow on color Doppler imaging. Profunda Femoral Vein: No evidence of thrombus. Normal compressibility and flow on color Doppler imaging. Femoral Vein: Mild echogenic mural irregularity in the left femoral vein is consistent with mild chronic thrombus related to prior DVT. This does not cause significant flow limitation or luminal stenosis. Popliteal Vein:  No evidence of thrombus. Normal compressibility, respiratory phasicity and response to augmentation. Calf Veins: No evidence of thrombus. Normal compressibility and flow on color Doppler imaging. Superficial Great Saphenous Vein: No evidence of thrombus. Normal compressibility. Venous Reflux:  None. Other Findings: No evidence of superficial thrombophlebitis or abnormal fluid collection. IMPRESSION: Mild amount of chronic thrombus in the right femoral and popliteal veins and likely right posterior tibial vein. Mild amount of chronic thrombus in the left common femoral and femoral veins. No significant flow limitation or luminal stenosis. No acute appearing thrombus identified. Electronically Signed   By: Aletta Edouard M.D.   On: 09/09/2018 15:39    EKG:   Orders placed or performed during the hospital encounter of 09/09/18  . ED EKG  . 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  1.  GI bleed-bright red stool in the blood.  Last colonoscopy was at the time of her ischemic colitis in 2016 -No active bleeding now.  Drop in hemoglobin to 8.7. -Appreciate GI consult.   -Tolerated liquid diet, refused any procedures, GI did not think that she needs any procedure this admission as stool has cleared up.  Could have been stomal bleeding from irritation and being on Eliquis worsened it. -Tolerated regular diet since yesterday. -Hemoglobin is pending this morning  2.  Hypertension-on Norvasc and metoprolol  3.  History of DVT and PE-Dopplers of lower extremities showing evidence of chronic clot,  she is status post IVC filter -Eliquis can be restarted at discharge -Most recent CT angio is  negative for any pulmonary embolism.  4.  Polycythemia-follows up with oncology as outpatient.  Currently on Jakafi orally  5.  DVT prophylaxis-tests and SCDs only due to GI bleed  Patient is wheelchair-bound at baseline   Granddaughter [name] updated over the phone today Possible discharge home today if hemoglobin is stable    All the records are reviewed and case discussed with Care Management/Social Workerr. Management plans discussed with the patient, family and they are in agreement.  CODE STATUS: Full code  TOTAL TIME TAKING CARE OF THIS PATIENT: 36 minutes.   POSSIBLE D/C IN 2-3 DAYS, DEPENDING ON CLINICAL CONDITION.   [name] M.D on 09/11/2018 at 1:18 PM  Between 7am to 6pm - Pager - [phone]  After 6pm go to [email or web address] - password [email or web address] [facility]  Office  [phone]  CC: Primary care physician; [name], MD

## 2018-09-11 NOTE — Progress Notes (Signed)
Patient complains of abdominal pain now. Has had greenish stool in stoma this AM No fever CBC reordered as sudden rise in hb and wbc is not reliable Grand daughter updated

## 2018-09-12 LAB — URINALYSIS, COMPLETE (UACMP) WITH MICROSCOPIC
Bilirubin Urine: NEGATIVE
GLUCOSE, UA: NEGATIVE mg/dL
Hgb urine dipstick: NEGATIVE
Ketones, ur: NEGATIVE mg/dL
Nitrite: NEGATIVE
PH: 8 (ref 5.0–8.0)
PROTEIN: NEGATIVE mg/dL
SPECIFIC GRAVITY, URINE: 1.015 (ref 1.005–1.030)

## 2018-09-12 MED ORDER — APIXABAN 5 MG PO TABS
5.0000 mg | ORAL_TABLET | Freq: Two times a day (BID) | ORAL | Status: DC
  Administered 2018-09-12: 12:00:00 5 mg via ORAL
  Filled 2018-09-12: qty 1

## 2018-09-12 MED ORDER — CEPHALEXIN 250 MG PO CAPS: 250.0000 mg | ORAL_CAPSULE | Freq: Three times a day (TID) | ORAL | 0 refills | Status: AC

## 2018-09-12 NOTE — Care Management Note (Signed)
Case Management Note  Patient Details  Name: Yolanda Wells MRN: [id number] Date of Birth: 1932-06-02  Subjective/Objective:  Patient to be discharged per MD order. Orders in place for home health services.  Patient open to [facility] and prefers to go back with them Referral placed with [name] from [facility] who agrees to accept resumption orders. Patient to use EMS for transportation home.                    Action/Plan:   Expected Discharge Date:  09/12/18               Expected Discharge Plan:  [place]  In-House Referral:     Discharge planning Services  CM Consult  Post Acute Care Choice:  Resumption of Svcs/PTA Provider, Home Health Choice offered to:  Patient, [facility] POA / Guardian  DME Arranged:    DME Agency:     HH Arranged:  RN, PT, Nurse's Aide [place] Agency:  [place]  Status of Service:  In process, will continue to follow  If discussed at Long Length of Stay Meetings, dates discussed:    Additional Comments:  Latanya Maudlin, RN 09/12/2018, 2:53 PM

## 2018-09-12 NOTE — Progress Notes (Signed)
Patient discharged via EMS home with granddaughter. Patient with no complaints.

## 2018-09-12 NOTE — Discharge Summary (Signed)
[facility] NAME: Yolanda Wells    MR#:  [id number]  DATE OF BIRTH:  [birth year]  DATE OF ADMISSION:  09/09/2018   ADMITTING PHYSICIAN: Loletha Grayer, MD  DATE OF DISCHARGE: 09/12/18  PRIMARY CARE PHYSICIAN: [name], MD   ADMISSION DIAGNOSIS:   Swelling [R60.9] Gastrointestinal hemorrhage, unspecified gastrointestinal hemorrhage type [K92.2]  DISCHARGE DIAGNOSIS:   Active Problems:   Lower GI bleed   SECONDARY DIAGNOSIS:   Past Medical History:  Diagnosis Date  . Acute deep vein thrombosis (DVT) of distal vein of left lower extremity ([place]) 10/03/2015  . Acute pulmonary embolism ([place]) 10/03/2015  . Anticoagulant long-term use   . Arthritis   . Arthritis   . CHF (congestive heart failure) ([place])   . Chronic anticoagulation 10/03/2015  . Collagen vascular disease ([place])   . Colostomy in place [facility]) 10/04/2015  . Deep venous thrombosis ([facility])    right lower extremity  . Dependent edema   . Diverticulosis of intestine with bleeding 04/02/2015  . Glenohumeral arthritis 06/28/2012  . Hammertoe 09/02/2012  . History of bilateral hip replacements 09/02/2012  . History of hysterectomy   . Hypertension   . Hypertension   . Hypokalemia   . Inability to ambulate due to hip 10/12/2015  . Mandibular dysfunction   . Onychomycosis 09/02/2012  . Osteoarthritis   . Polycythemia vera ([place])   . Presence of IVC filter 05/25/2015  . Stroke ([place]) 03/26/2015   cerebellar  . 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  1.  GI bleed-bright red stool in the blood.  Last colonoscopy was  at the time of her ischemic colitis in 2016 -No active bleeding now. Hb stable at 10 -Appreciate GI consult.   -Tolerated regular diet, refused any procedures, GI did not think that she needs any procedure this admission as stool has cleared up.  Could have been stomal bleeding from irritation and being on Eliquis worsened it.  2.  Hypertension-on Norvasc and metoprolol  3.  History of DVT and PE-Dopplers of lower extremities showing evidence of chronic clot, she is status post IVC filter -Eliquis can be restarted now -Most recent CT angio is negative for any pulmonary embolism.  4.  Polycythemia-follows up with oncology as outpatient.  Currently on Jakafi orally  5.  DVT prophylaxis-tests and SCDs only due to GI bleed  6.  Leukocytosis-only 66% neutrophils, no evidence of any infection.  UA is pending.  Patient does have frequent bladder infections.  Will start Keflex  Patient is wheelchair-bound at baseline   Granddaughter [name] updated over the phone    DISCHARGE CONDITIONS:   Guarded  CONSULTS OBTAINED:   GI consult by Dr. Marius Ditch  DRUG ALLERGIES:   No Known Allergies DISCHARGE MEDICATIONS:   Allergies as of 09/12/2018   No Known Allergies     Medication List    STOP taking these medications   aspirin EC 81 MG tablet   docusate sodium 100 MG capsule Commonly known as:  COLACE     TAKE these medications   amLODipine 5 MG tablet Commonly known as:  NORVASC Take 5 mg by mouth daily.  cephALEXin 250 MG capsule Commonly known as:  KEFLEX Take 1 capsule (250 mg total) by mouth 3 (three) times daily for 7 days.   cholecalciferol 1000 units tablet Commonly known as:  VITAMIN D Take 1,000 Units by mouth daily.   ELIQUIS 5 MG Tabs tablet Generic drug:  apixaban TAKE ONE TABLET BY MOUTH TWICE A DAY What changed:  how much to take   estradiol 0.1 MG/GM vaginal cream Commonly known as:  ESTRACE Apply 0.5mg  (pea-sized amount)  just inside the  vaginal introitus with a finger-tip on Monday, Wednesday and Friday nights.   ferrous sulfate 325 (65 FE) MG tablet Take 1 tablet (325 mg total) by mouth daily.   folic acid 825 MCG tablet Commonly known as:  FOLVITE Take 400 mcg by mouth daily.   ipratropium-albuterol 0.5-2.5 (3) MG/3ML Soln Commonly known as:  DUONEB Take 3 mLs by nebulization every 4 (four) hours as needed (for shortness of breath/wheezing).   JAKAFI 15 MG tablet Generic drug:  ruxolitinib phosphate TAKE 1 TABLET (15 MG TOTAL) BY MOUTH 2 TIMES DAILY. What changed:  See the new instructions.   metoprolol tartrate 25 MG tablet Commonly known as:  LOPRESSOR Take 0.5 tablets (12.5 mg total) by mouth 2 (two) times daily.   pantoprazole 40 MG tablet Commonly known as:  PROTONIX Take 1 tablet (40 mg total) by mouth daily.        DISCHARGE INSTRUCTIONS:   1. PCP f/u in 1-2 weeks 2. GI f/u in 2 weeks  DIET:   Cardiac diet  ACTIVITY:   Activity as tolerated  OXYGEN:   Home Oxygen: No.  Oxygen Delivery: room air  DISCHARGE LOCATION:   home   If you experience worsening of your admission symptoms, develop shortness of breath, life threatening emergency, suicidal or homicidal thoughts you must seek medical attention immediately by calling 911 or calling your MD immediately  if symptoms less severe.  You Must read complete instructions/literature along with all the possible adverse reactions/side effects for all the Medicines you take and that have been prescribed to you. Take any new Medicines after you have completely understood and accpet all the possible adverse reactions/side effects.   Please note  You were cared for by a hospitalist during your hospital stay. If you have any questions about your discharge medications or the care you received while you were in the hospital after you are discharged, you can call the unit and asked to speak with the hospitalist on call if the hospitalist that took  care of you is not available. Once you are discharged, your primary care physician will handle any further medical issues. Please note that NO REFILLS for any discharge medications will be authorized once you are discharged, as it is imperative that you return to your primary care physician (or establish a relationship with a primary care physician if you do not have one) for your aftercare needs so that they can reassess your need for medications and monitor your lab values.    On the day of Discharge:  VITAL SIGNS:   Blood pressure (!) 150/91, pulse 70, temperature (!) 97.5 F (36.4 C), temperature source Oral, resp. rate 20, height 5\' 7"  (1.702 m), weight 91.6 kg, SpO2 97 %.  PHYSICAL EXAMINATION:   GENERAL:  84 y.o.-year-old patient lying in the bed with no acute distress.  EYES: Pupils equal, round, reactive to light and accommodation. No scleral icterus. Extraocular muscles intact.  HEENT: Head atraumatic, normocephalic. Oropharynx and nasopharynx clear.  NECK:  Supple, no jugular venous distention. No thyroid enlargement, no tenderness.  LUNGS: Normal breath sounds bilaterally, no wheezing, rales,rhonchi or crepitation. No use of accessory muscles of respiration.  Decreased bibasilar breath sounds CARDIOVASCULAR: S1, S2 normal. No murmurs, rubs, or gallops.  ABDOMEN: Soft, nontender, nondistended. Bowel sounds present.  Right lower quadrant colostomy bag in place with no stool.  No organomegaly or mass.  EXTREMITIES: No  cyanosis, or clubbing. 1+ bilateral lower extremity edema noted NEUROLOGIC: Cranial nerves II through XII are intact. Muscle strength 5/5 in both upper extremities and 3/5 in RLE and 2/5 in LLE. Sensation intact. Gait not checked. Global weakness noted.  PSYCHIATRIC: The patient is alert and oriented x 3.  SKIN: No obvious rash, lesion, or ulcer.   DATA REVIEW:   CBC Recent Labs  Lab 09/11/18 1548  WBC 16.7*  HGB 10.1*  HCT 34.5*  PLT PLATELET CLUMPS NOTED ON  SMEAR, UNABLE TO ESTIMATE    Chemistries  Recent Labs  Lab 09/09/18 0943  09/11/18 0722  NA 142   < > 142  K 4.0   < > 4.0  CL 102   < > 103  CO2 32   < > 33*  GLUCOSE 99   < > 86  BUN 9   < > 6*  CREATININE 0.57   < > 0.50  CALCIUM 9.0   < > 8.2*  AST 23  --   --   ALT 9  --   --   ALKPHOS 65  --   --   BILITOT 0.7  --   --    < > = values in this interval not displayed.     Microbiology Results  Results for orders placed or performed during the hospital encounter of 08/30/18  MRSA PCR Screening     Status: None   Collection Time: 08/31/18  4:44 AM  Result Value Ref Range Status   MRSA by PCR NEGATIVE NEGATIVE Final    Comment:        The GeneXpert MRSA Assay (FDA approved for NASAL specimens only), is one component of a comprehensive MRSA colonization surveillance program. It is not intended to diagnose MRSA infection nor to guide or monitor treatment for MRSA infections. Performed at [facility], [address] [place], [place] [address]   Urine Culture     Status: Abnormal   Collection Time: 08/31/18 11:33 AM  Result Value Ref Range Status   Specimen Description   Final    URINE, RANDOM Performed at [facility], [address] [place], [place] [address]    Special Requests   Final    Normal Performed at [facility], [address] [place],  [address]    Culture 40,000 COLONIES/mL PROVIDENCIA STUARTII (A)  Final   Report Status 09/03/2018 FINAL  Final   Organism ID, Bacteria PROVIDENCIA STUARTII (A)  Final      Susceptibility   Providencia stuartii - MIC*    AMPICILLIN >=32 RESISTANT Resistant     CEFAZOLIN >=64 RESISTANT Resistant     CEFTRIAXONE <=1 SENSITIVE Sensitive     CIPROFLOXACIN >=4 RESISTANT Resistant     GENTAMICIN RESISTANT Resistant     IMIPENEM 1 SENSITIVE Sensitive     NITROFURANTOIN 256 RESISTANT Resistant     TRIMETH/SULFA 40 SENSITIVE Sensitive     AMPICILLIN/SULBACTAM 16 INTERMEDIATE  Intermediate     PIP/TAZO <=4 SENSITIVE Sensitive     * 40,000 COLONIES/mL PROVIDENCIA STUARTII  RADIOLOGY:  Dg Abd 1 View  Result Date: 09/11/2018 CLINICAL DATA:  Constipation for 2 days EXAM: ABDOMEN - 1 VIEW COMPARISON:  04/13/2018 FINDINGS: Scattered large and small bowel gas is noted. No retained fecal material to suggest constipation is noted. Ostomy is noted on the left. No obstructive changes are seen. Bilateral hip replacements and IVC filter are noted. Degenerative changes of the lumbar spine are seen. IMPRESSION: No acute abnormality noted.  No findings of constipation are seen. Electronically Signed   By: Inez Catalina M.D.   On: 09/11/2018 17:15   Ct Abdomen Pelvis W Contrast  Result Date: 09/11/2018 CLINICAL DATA:  Abdominal pain with unintended weight loss EXAM: CT ABDOMEN AND PELVIS WITH CONTRAST TECHNIQUE: Multidetector CT imaging of the abdomen and pelvis was performed using the standard protocol following bolus administration of intravenous contrast. CONTRAST:  168mL ISOVUE-300 IOPAMIDOL (ISOVUE-300) INJECTION 61% COMPARISON:  04/13/2018 FINDINGS: Lower chest: Bibasilar atelectatic changes are noted right greater than left. Minimal right effusion is seen. Hepatobiliary: Gallbladder is well distended and demonstrates multiple gallstones within. These are stable in appearance from the prior exam. The liver is unremarkable. Pancreas: Unremarkable. No pancreatic ductal dilatation or surrounding inflammatory changes. Spleen: Normal in size without focal abnormality. Adrenals/Urinary Tract: Adrenal glands are within normal limits. The left kidney demonstrates no renal calculi or urinary tract obstructive changes. A few scattered small cysts are noted. The right kidney demonstrates multiple cysts as well as scarring stable from the previous exam. No obstructive changes are seen. Bladder is partially distended. Stomach/Bowel: Scattered diverticular change of the colon is noted. Extensive  Hartmann's pouch is seen. Left-sided colostomy is noted stable from the prior exam. A parastomal hernia is noted containing loops of small bowel without obstructive change. The appendix is not well visualized although no inflammatory changes to suggest appendicitis are noted. Small bowel and stomach appear within normal limits. Vascular/Lymphatic: Aortic atherosclerosis. No enlarged abdominal or pelvic lymph nodes. IVC filter is noted in place and stable. Reproductive: Status post hysterectomy. No adnexal masses. Other: No abdominal wall hernia or abnormality. No abdominopelvic ascites. Musculoskeletal: Degenerative changes of the lumbar spine are noted. Bilateral hip replacement is seen. IMPRESSION: Cholelithiasis without complicating factors. Bibasilar atelectatic changes right greater than left with a small right effusion. Diverticulosis without diverticulitis. Left-sided ostomy with peristomal hernia containing small bowel loops without obstructive change stable from the previous study. Chronic changes as described above. No acute abnormality to correspond with the patient's given clinical history is noted. Electronically Signed   By: Inez Catalina M.D.   On: 09/11/2018 21:59     Management plans discussed with the patient, family and they are in agreement.  CODE STATUS:     Code Status Orders  (From admission, onward)         Start     Ordered   09/09/18 1241  Full code  Continuous     09/09/18 1240        Code Status History    Date Active Date Inactive Code Status Order ID Comments User Context   08/30/2018 2018 09/01/2018 2004 DNR [id number]  Nicholes Mango, MD ED   08/30/2018 1903 08/30/2018 2018 Full Code [id number]  Nicholes Mango, MD ED   08/13/2018 1038 08/15/2018 1836 Full Code [id number]  Loletha Grayer, MD Inpatient   08/12/2018 2001 08/13/2018 1038 DNR [id number]  Gorden Harms, MD ED   08/12/2018 2001 08/12/2018 2001 Full Code [id number]  Salary, Avel Peace, MD ED   06/15/2018 1635  06/18/2018  2009 Full Code [id number]  [name], MD Inpatient   02/24/2018 0347 02/27/2018 0027 Full Code [id number]  Amelia Jo, MD Inpatient   10/15/2017 1141 10/16/2017 2204 Full Code [id number]  Knox Royalty, NP Inpatient   10/13/2017 2246 10/15/2017 1141 DNR [id number]  Lance Coon, MD ED   10/06/2017 1454 10/09/2017 2058 DNR [id number]  Nicholes Mango, MD Inpatient   10/06/2017 1111 10/06/2017 1453 Full Code [id number]  Nicholes Mango, MD Inpatient   05/27/2017 1954 05/28/2017 2013 Full Code [id number]  Vaughan Basta, MD Inpatient   05/23/2017 2322 05/26/2017 1614 Full Code [id number]  Lance Coon, MD Inpatient   04/10/2017 0325 04/11/2017 1916 Full Code [id number]  Harrie Foreman, MD Inpatient   11/23/2016 0629 12/01/2016 1935 Full Code [id number]  Harrie Foreman, MD Inpatient   08/26/2016 0445 08/26/2016 1923 Full Code [id number]  Saundra Shelling, MD Inpatient   09/22/2015 1755 09/25/2015 1844 Full Code [id number]  Demetrios Loll, MD ED   09/15/2015 0341 09/17/2015 1741 Full Code [id number]  Harrie Foreman, MD Inpatient   07/16/2015 0151 07/20/2015 2334 Full Code [id number]  Lance Coon, MD Inpatient   05/25/2015 0834 05/29/2015 1616 Full Code [id number]  Harrie Foreman, MD Inpatient   04/02/2015 0606 04/08/2015 1651 Full Code [id number]  Harrie Foreman, MD Inpatient    Advance Directive Documentation     Most Recent Value  Type of Advance Directive  Healthcare Power of Attorney, Living will  Pre-existing out of facility DNR order (yellow form or pink MOST form)  Pink MOST form placed in chart (order not valid for inpatient use)  "MOST" Form in Place?  -      TOTAL TIME TAKING CARE OF THIS PATIENT: 38 minutes.    [name] M.D on 09/12/2018 at 11:25 AM  Between 7am to 6pm - Pager - [phone]  After 6pm go to [email or web address] - Proofreader  [facility]  Office  [phone]  CC: Primary care physician; [name], MD   Note: This  dictation was prepared with Dragon dictation along with smaller phrase technology. Any transcriptional errors that result from this process are unintentional.

## 2018-09-12 NOTE — Plan of Care (Signed)
  Problem: Education: Goal: Knowledge of General Education information will improve Description Including pain rating scale, medication(s)/side effects and non-pharmacologic comfort measures Outcome: Progressing   Problem: Health Behavior/Discharge Planning: Goal: Ability to manage health-related needs will improve Outcome: Progressing   Problem: Clinical Measurements: Goal: Ability to maintain clinical measurements within normal limits will improve Outcome: Progressing Goal: Will remain free from infection Outcome: Progressing Goal: Diagnostic test results will improve Outcome: Progressing Goal: Respiratory complications will improve Outcome: Progressing Goal: Cardiovascular complication will be avoided Outcome: Progressing   Problem: Activity: Goal: Risk for activity intolerance will decrease Outcome: Progressing   Problem: Skin Integrity: Goal: Risk for impaired skin integrity will decrease Outcome: Progressing   Problem: Education: Goal: Knowledge of General Education information will improve Description Including pain rating scale, medication(s)/side effects and non-pharmacologic comfort measures Outcome: Progressing   Problem: Health Behavior/Discharge Planning: Goal: Ability to manage health-related needs will improve Outcome: Progressing   Problem: Clinical Measurements: Goal: Ability to maintain clinical measurements within normal limits will improve Outcome: Progressing Goal: Will remain free from infection Outcome: Progressing Goal: Diagnostic test results will improve Outcome: Progressing Goal: Respiratory complications will improve Outcome: Progressing Goal: Cardiovascular complication will be avoided Outcome: Progressing   Problem: Activity: Goal: Risk for activity intolerance will decrease Outcome: Progressing   Problem: Nutrition: Goal: Adequate nutrition will be maintained Outcome: Progressing   Problem: Elimination: Goal: Will not  experience complications related to urinary retention Outcome: Progressing   Problem: Safety: Goal: Ability to remain free from injury will improve Outcome: Progressing   Problem: Skin Integrity: Goal: Risk for impaired skin integrity will decrease Outcome: Progressing

## 2018-09-13 LAB — PATHOLOGIST SMEAR REVIEW

## 2018-09-15 LAB — URINE CULTURE

## 2018-09-21 ENCOUNTER — Emergency Department: Payer: Medicare Other

## 2018-09-21 ENCOUNTER — Inpatient Hospital Stay
Admission: EM | Admit: 2018-09-21 | Discharge: 2018-09-25 | DRG: 871 | Disposition: A | Discharge status: Home Health | Payer: Medicare Other | Attending: Internal Medicine | Admitting: Internal Medicine

## 2018-09-21 ENCOUNTER — Other Ambulatory Visit: Payer: Self-pay

## 2018-09-21 DIAGNOSIS — Z86718 Personal history of other venous thrombosis and embolism: Secondary | ICD-10-CM

## 2018-09-21 DIAGNOSIS — R7989 Other specified abnormal findings of blood chemistry: Secondary | ICD-10-CM

## 2018-09-21 DIAGNOSIS — I110 Hypertensive heart disease with heart failure: Secondary | ICD-10-CM | POA: Diagnosis present

## 2018-09-21 DIAGNOSIS — M19019 Primary osteoarthritis, unspecified shoulder: Secondary | ICD-10-CM | POA: Diagnosis present

## 2018-09-21 DIAGNOSIS — Z808 Family history of malignant neoplasm of other organs or systems: Secondary | ICD-10-CM

## 2018-09-21 DIAGNOSIS — R0902 Hypoxemia: Secondary | ICD-10-CM

## 2018-09-21 DIAGNOSIS — R778 Other specified abnormalities of plasma proteins: Secondary | ICD-10-CM

## 2018-09-21 DIAGNOSIS — I69354 Hemiplegia and hemiparesis following cerebral infarction affecting left non-dominant side: Secondary | ICD-10-CM

## 2018-09-21 DIAGNOSIS — I712 Thoracic aortic aneurysm, without rupture: Secondary | ICD-10-CM | POA: Diagnosis present

## 2018-09-21 DIAGNOSIS — R0689 Other abnormalities of breathing: Secondary | ICD-10-CM

## 2018-09-21 DIAGNOSIS — Z86711 Personal history of pulmonary embolism: Secondary | ICD-10-CM

## 2018-09-21 DIAGNOSIS — Z825 Family history of asthma and other chronic lower respiratory diseases: Secondary | ICD-10-CM

## 2018-09-21 DIAGNOSIS — R451 Restlessness and agitation: Secondary | ICD-10-CM | POA: Diagnosis not present

## 2018-09-21 DIAGNOSIS — E538 Deficiency of other specified B group vitamins: Secondary | ICD-10-CM | POA: Diagnosis present

## 2018-09-21 DIAGNOSIS — Z8261 Family history of arthritis: Secondary | ICD-10-CM

## 2018-09-21 DIAGNOSIS — A419 Sepsis, unspecified organism: Secondary | ICD-10-CM | POA: Diagnosis present

## 2018-09-21 DIAGNOSIS — A4150 Gram-negative sepsis, unspecified: Principal | ICD-10-CM | POA: Diagnosis present

## 2018-09-21 DIAGNOSIS — N3000 Acute cystitis without hematuria: Secondary | ICD-10-CM | POA: Diagnosis present

## 2018-09-21 DIAGNOSIS — D45 Polycythemia vera: Secondary | ICD-10-CM | POA: Diagnosis present

## 2018-09-21 DIAGNOSIS — J441 Chronic obstructive pulmonary disease with (acute) exacerbation: Secondary | ICD-10-CM | POA: Diagnosis present

## 2018-09-21 DIAGNOSIS — F0150 Vascular dementia without behavioral disturbance: Secondary | ICD-10-CM | POA: Diagnosis present

## 2018-09-21 DIAGNOSIS — Z79899 Other long term (current) drug therapy: Secondary | ICD-10-CM

## 2018-09-21 DIAGNOSIS — I5032 Chronic diastolic (congestive) heart failure: Secondary | ICD-10-CM | POA: Diagnosis present

## 2018-09-21 DIAGNOSIS — Z8249 Family history of ischemic heart disease and other diseases of the circulatory system: Secondary | ICD-10-CM

## 2018-09-21 DIAGNOSIS — Z7901 Long term (current) use of anticoagulants: Secondary | ICD-10-CM

## 2018-09-21 DIAGNOSIS — J9621 Acute and chronic respiratory failure with hypoxia: Secondary | ICD-10-CM | POA: Diagnosis present

## 2018-09-21 DIAGNOSIS — Z833 Family history of diabetes mellitus: Secondary | ICD-10-CM

## 2018-09-21 DIAGNOSIS — Z90710 Acquired absence of both cervix and uterus: Secondary | ICD-10-CM

## 2018-09-21 DIAGNOSIS — Z96653 Presence of artificial knee joint, bilateral: Secondary | ICD-10-CM | POA: Diagnosis present

## 2018-09-21 LAB — COMPREHENSIVE METABOLIC PANEL
ALT: 10 U/L (ref 0–44)
AST: 19 U/L (ref 15–41)
Albumin: 3.2 g/dL — ABNORMAL LOW (ref 3.5–5.0)
Alkaline Phosphatase: 71 U/L (ref 38–126)
Anion gap: 8 (ref 5–15)
BUN: 14 mg/dL (ref 8–23)
CHLORIDE: 106 mmol/L (ref 98–111)
CO2: 31 mmol/L (ref 22–32)
CREATININE: 0.64 mg/dL (ref 0.44–1.00)
Calcium: 9 mg/dL (ref 8.9–10.3)
GFR calc Af Amer: >60 mL/min (ref >60)
Glucose, Bld: 109 mg/dL — ABNORMAL HIGH (ref 70–99)
POTASSIUM: 4.1 mmol/L (ref 3.5–5.1)
Sodium: 145 mmol/L (ref 135–145)
Total Bilirubin: 0.7 mg/dL (ref 0.3–1.2)
Total Protein: 7.4 g/dL (ref 6.5–8.1)

## 2018-09-21 LAB — BRAIN NATRIURETIC PEPTIDE: B NATRIURETIC PEPTIDE 5: 72 pg/mL (ref 0.0–100.0)

## 2018-09-21 LAB — URINALYSIS, COMPLETE (UACMP) WITH MICROSCOPIC
BILIRUBIN URINE: NEGATIVE
Glucose, UA: NEGATIVE mg/dL
Hgb urine dipstick: NEGATIVE
KETONES UR: NEGATIVE mg/dL
Nitrite: NEGATIVE
PH: 5 (ref 5.0–8.0)
Protein, ur: 30 mg/dL — AB
SPECIFIC GRAVITY, URINE: 1.027 (ref 1.005–1.030)

## 2018-09-21 LAB — CBC
HCT: 33.5 % — ABNORMAL LOW (ref 36.0–46.0)
Hemoglobin: 9.6 g/dL — ABNORMAL LOW (ref 12.0–15.0)
MCH: 24.7 pg — AB (ref 26.0–34.0)
MCHC: 28.7 g/dL — ABNORMAL LOW (ref 30.0–36.0)
MCV: 86.3 fL (ref 80.0–100.0)
PLATELETS: 338 10*3/uL (ref 150–400)
RBC: 3.88 MIL/uL (ref 3.87–5.11)
RDW: 31.7 % — ABNORMAL HIGH (ref 11.5–15.5)
WBC: 15 10*3/uL — ABNORMAL HIGH (ref 4.0–10.5)
nRBC: 6 % — ABNORMAL HIGH (ref 0.0–0.2)

## 2018-09-21 LAB — INFLUENZA PANEL BY PCR (TYPE A & B)
Influenza A By PCR: NEGATIVE
Influenza B By PCR: NEGATIVE

## 2018-09-21 LAB — CG4 I-STAT (LACTIC ACID): LACTIC ACID, VENOUS: 0.91 mmol/L (ref 0.5–1.9)

## 2018-09-21 LAB — TROPONIN I: TROPONIN I: 0.03 ng/mL — AB (ref <0.03)

## 2018-09-21 MED ORDER — ASPIRIN 81 MG PO CHEW
324.0000 mg | CHEWABLE_TABLET | Freq: Once | ORAL | Status: AC
  Administered 2018-09-21: 324 mg via ORAL
  Filled 2018-09-21: qty 4

## 2018-09-21 MED ORDER — VANCOMYCIN HCL 10 G IV SOLR
1750.0000 mg | Freq: Once | INTRAVENOUS | Status: AC
  Administered 2018-09-21: 1750 mg via INTRAVENOUS
  Filled 2018-09-21: qty 1750

## 2018-09-21 MED ORDER — IPRATROPIUM-ALBUTEROL 0.5-2.5 (3) MG/3ML IN SOLN
3.0000 mL | Freq: Once | RESPIRATORY_TRACT | Status: AC
  Administered 2018-09-21: 3 mL via RESPIRATORY_TRACT
  Filled 2018-09-21: qty 3

## 2018-09-21 MED ORDER — METHYLPREDNISOLONE SODIUM SUCC 125 MG IJ SOLR
125.0000 mg | Freq: Once | INTRAMUSCULAR | Status: AC
  Administered 2018-09-21: 125 mg via INTRAVENOUS
  Filled 2018-09-21: qty 2

## 2018-09-21 MED ORDER — SODIUM CHLORIDE 0.9 % IV BOLUS: 1000.0000 mL | Freq: Once | INTRAVENOUS | Status: DC

## 2018-09-21 MED ORDER — PIPERACILLIN-TAZOBACTAM 3.375 G IVPB 30 MIN
3.3750 g | Freq: Once | INTRAVENOUS | Status: AC
  Administered 2018-09-21: 3.375 g via INTRAVENOUS
  Filled 2018-09-21: qty 50

## 2018-09-21 MED ORDER — IOHEXOL 350 MG/ML SOLN
75.0000 mL | Freq: Once | INTRAVENOUS | Status: AC | PRN
  Administered 2018-09-21: 75 mL via INTRAVENOUS

## 2018-09-21 NOTE — ED Notes (Signed)
Report given to Ramond Dial and Butch Penny RN

## 2018-09-21 NOTE — ED Notes (Signed)
Pt still gone at this time. Pt at CT angio

## 2018-09-21 NOTE — ED Notes (Signed)
Pt gone to xray

## 2018-09-21 NOTE — ED Notes (Signed)
Called lab and spoke with [facility] and she stated they would add on urine culture to specimen in lab.

## 2018-09-21 NOTE — ED Provider Notes (Signed)
[facility] Emergency Department Provider Note  ____________________________________________  Time seen: Approximately 5:58 PM  I have reviewed the triage vital signs and the nursing notes.   HISTORY  Chief Complaint No chief complaint on file.    HPI Yolanda Wells is a 84 y.o. female diastolic CHF and COPD on baseline 2 L nasal cannula, history of DVT and PE back on Eliquis after brief discontinuation for GI bleed 2 weeks ago, presenting for cough, shortness of breath and hypoxia.  The patient lives at home with her granddaughter.  They both report that for the past 24 hours she has begun to have a cough that is productive of brown sputum with shortness of breath.  She was awake multiple times throughout the night due to coughing spasms.  On her baseline 2 L nasal cannula, she was having oxygen saturations at 89%.  She has not had any congestion or rhinorrhea, sore throat, and did receive her influenza vaccination this year.  She has not had any nausea vomiting or diarrhea.  No fevers or chills.  No change in her lower extremity edema, no chest pain.  Reviewed the patient's chart, and she is followed by palliative care for her multiple COPD related admissions.  The patient and her granddaughter who cares for Wishes to proceed with complete diagnostic and therapeutic work-up in the emergency department and admission as needed.  The patient is full code.  Past Medical History:  Diagnosis Date  . Acute deep vein thrombosis (DVT) of distal vein of left lower extremity ([place]) 10/03/2015  . Acute pulmonary embolism ([place]) 10/03/2015  . Anticoagulant long-term use   . Arthritis   . Arthritis   . CHF (congestive heart failure) ([place])   . Chronic anticoagulation 10/03/2015  . Collagen vascular disease ([place])   . Colostomy in place [facility]) 10/04/2015  . Deep venous thrombosis (H[facility])    right lower extremity  . Dependent edema   . Diverticulosis of intestine with bleeding  04/02/2015  . Glenohumeral arthritis 06/28/2012  . Hammertoe 09/02/2012  . History of bilateral hip replacements 09/02/2012  . History of hysterectomy   . Hypertension   . Hypertension   . Hypokalemia   . Inability to ambulate due to hip 10/12/2015  . Mandibular dysfunction   . Onychomycosis 09/02/2012  . Osteoarthritis   . Polycythemia vera ([place])   . Presence of IVC filter 05/25/2015  . Stroke ([place]) 03/26/2015   cerebellar  . Urinary incontinence in female 09/02/2016    Patient Active Problem List   Diagnosis Date Noted  . Lower GI bleed 09/09/2018  . AMS (altered mental status) 08/30/2018  . Pressure injury of skin 08/13/2018  . B12 deficiency 08/04/2018  . Anemia 06/07/2018  . Secondary myelofibrosis ([place]) 02/17/2018  . Goals of care, counseling/discussion 12/23/2017  . Palliative care by specialist   . DNR (do not resuscitate) discussion   . Weakness generalized   . Chronic diastolic CHF (congestive heart failure) ([place]) 07/28/2017  . Vascular dementia without behavioral disturbance ([place]) 07/08/2017  . Influenza with respiratory manifestation 12/01/2016  . Respiratory distress 11/23/2016  . Urinary incontinence in female 09/02/2016  . Vision loss of right eye 08/26/2016  . Blindness of right eye 08/26/2016  . Inability to ambulate due to hip 10/12/2015  . Colostomy in place [facility]) 10/04/2015  . Long term current use of anticoagulant therapy 10/03/2015  . Leukocytosis 09/25/2015  . CAP (community acquired pneumonia) 09/24/2015  . COPD (chronic obstructive pulmonary disease) ([place]) 09/24/2015  .  UTI (urinary tract infection) 07/16/2015  . Abdominal wall cellulitis 07/15/2015  . DVT (deep venous thrombosis) ([place]) 07/15/2015  . HTN (hypertension) 07/15/2015  . Polycythemia vera ([place]) 07/15/2015  . Arthritis 07/15/2015  . Pulmonary emboli ([place]) 05/25/2015  . Diverticulosis of colon with hemorrhage 04/02/2015  . Diverticulosis of intestine with bleeding 04/02/2015  .  Fall 01/10/2015  . Osteoarthritis 01/10/2015  . Hammertoe 09/02/2012  . S/P transmetatarsal amputation of foot ([place]) 09/02/2012  . Onychomycosis 09/02/2012  . Other specified dermatoses 09/02/2012  . S/P hip replacement 08/23/2012  . Glenohumeral arthritis 06/28/2012    Past Surgical History:  Procedure Laterality Date  . ABDOMINAL HYSTERECTOMY    . BLEPHAROPLASTY Right 03/2017   right upper eyelid  . CARDIAC CATHETERIZATION Right 04/03/2015   Procedure: CENTRAL LINE INSERTION;  Surgeon: Sherri Rad, MD;  Location: [facility] ORS;  Service: General;  Laterality: Right;  . COLECTOMY WITH COLOSTOMY CREATION/HARTMANN PROCEDURE N/A 04/03/2015   Procedure: COLECTOMY WITH COLOSTOMY CREATION/HARTMANN PROCEDURE;  Surgeon: Sherri Rad, MD;  Location: [facility] ORS;  Service: General;  Laterality: N/A;  . COLON SURGERY    . FOOT AMPUTATION     partial  . FOOT AMPUTATION Right   . IVC FILTER INSERTION    . JOINT REPLACEMENT     Left and Right Hip  . PERIPHERAL VASCULAR CATHETERIZATION N/A 04/02/2015   Procedure: Visceral Angiography;  Surgeon: Algernon Huxley, MD;  Location: [place] [facility];  Service: Cardiovascular;  Laterality: N/A;  . PERIPHERAL VASCULAR CATHETERIZATION N/A 04/02/2015   Procedure: Visceral Artery Intervention;  Surgeon: Algernon Huxley, MD;  Location: [place] CV [facility];  Service: Cardiovascular;  Laterality: N/A;  . PERIPHERAL VASCULAR CATHETERIZATION N/A 05/25/2015   Procedure: IVC Filter Insertion;  Surgeon: Katha Cabal, MD;  Location: [place] CV LAB;  Service: Cardiovascular;  Laterality: N/A;  . TOE AMPUTATION     right  . TOTAL HIP ARTHROPLASTY      Current Outpatient Rx  . Order #: [id number] Class: Historical Med  . Order #: [id number] Class: Historical Med  . Order #: [id number] Class: Normal  . Order #: [id number] Class: Print  . Order #: [id number] Class: Normal  . Order #: [id number] Class: Historical Med  . Order #: [id number] Class: Historical Med  . Order #:  [id number] Class: Normal  . Order #: [id number] Class: Normal  . Order #: [id number] Class: Normal    Allergies Patient has no known allergies.  Family History  Problem Relation Age of Onset  . Brain cancer Mother   . Heart attack Father   . COPD Father   . Coronary artery disease Father   . Hypertension Unknown   . Arthritis-Osteo Unknown   . Diabetes Brother   . Arthritis Brother   . Osteosarcoma Son   . Bone cancer Son   . Arthritis Sister   . Arthritis Brother   . Hypertension Brother   . Coronary artery disease Brother   . Malignant hyperthermia Brother     Social History Social History   Tobacco Use  . Smoking status: Never Smoker  . Smokeless tobacco: Never Used  Substance Use Topics  . Alcohol use: No  . Drug use: No    Review of Systems Constitutional: No fever/chills. Eyes: No visual changes.  No eye discharge. ENT: No sore throat. No congestion or rhinorrhea.  No sore throat. Cardiovascular: Denies chest pain. Denies palpitations. Respiratory: Denies shortness of breath.  Positive productive cough. Gastrointestinal: No abdominal pain.  No nausea, no vomiting.  No diarrhea.  No constipation.  Genitourinary: Negative for dysuria.  Baseline incontinence. Musculoskeletal: Negative for back pain.  Positive unchanged lower extremity edema peer Skin: Negative for rash. Neurological: Negative for headaches. No focal numbness, tingling or weakness.     ____________________________________________   PHYSICAL EXAM:  VITAL SIGNS: ED Triage Vitals  Enc Vitals Group     BP 09/21/18 1701 (!) 162/110     Pulse Rate 09/21/18 1701 80     Resp 09/21/18 1701 (!) 26     Temp 09/21/18 1701 97.7 F (36.5 C)     Temp Source 09/21/18 1701 Oral     SpO2 09/21/18 1701 90 %     Weight 09/21/18 1659 201 lb 15.1 oz (91.6 kg)     Height 09/21/18 1659 5\' 7"  (1.702 m)     Head Circumference --      Peak Flow --      Pain Score 09/21/18 1659 0     Pain Loc --      Pain Edu?  --      Excl. in [place]? --     Constitutional: Alert and oriented. Answers questions appropriately.  Chronically ill-appearing. Eyes: Conjunctivae are normal.  EOMI. No scleral icterus.  No eye discharge. Head: Atraumatic. Nose: No congestion/rhinnorhea. Mouth/Throat: Mucous membranes are dry.  Neck: No stridor.  Supple.  No JVD.  No meningismus. Cardiovascular: Normal rate, regular rhythm. No murmurs, rubs or gallops.  Respiratory: The patient has mild tachypnea without accessory muscle use or retractions.  On my exam, the patient has O2 sats of 100% on 2 L nasal cannula.  She has extra Tory greater than inspiratory wheezing without any rales or rhonchi. Gastrointestinal: Obese.  Soft, nontender and nondistended.  No guarding or rebound.  No peritoneal signs. Musculoskeletal: Positive symmetric pitting LE edema to the mid thigh. No ttp in the calves or palpable cords.  Negative Homan's sign. Neurologic: alert.[name]  Speech is clear.  Face and smile are symmetric.  EOMI.  Moves all extremities well. Skin:  Skin is warm, dry and intact. No rash noted. Psychiatric: Mood and affect are normal.   ____________________________________________   LABS (all labs ordered are listed, but only abnormal results are displayed)  Labs Reviewed  CBC - Abnormal; Notable for the following components:      Result Value   WBC 15.0 (*)    Hemoglobin 9.6 (*)    HCT 33.5 (*)    MCH 24.7 (*)    MCHC 28.7 (*)    RDW 31.7 (*)    nRBC 6.0 (*)    All other components within normal limits  COMPREHENSIVE METABOLIC PANEL - Abnormal; Notable for the following components:   Glucose, Bld 109 (*)    Albumin 3.2 (*)    All other components within normal limits  BLOOD GAS, VENOUS - Abnormal; Notable for the following components:   pCO2, Ven 66 (*)    Bicarbonate 34.0 (*)    Acid-Base Excess 6.5 (*)    All other components within normal limits  URINALYSIS, COMPLETE (UACMP) WITH MICROSCOPIC - Abnormal; Notable for the  following components:   Color, Urine YELLOW (*)    APPearance HAZY (*)    Protein, ur 30 (*)    Leukocytes, UA TRACE (*)    Bacteria, UA RARE (*)    All other components within normal limits  TROPONIN I - Abnormal; Notable for the following components:   Troponin I 0.03 (*)    All other components within normal limits  CULTURE, BLOOD (ROUTINE  X 2)  CULTURE, BLOOD (ROUTINE X 2)  BRAIN NATRIURETIC PEPTIDE  INFLUENZA PANEL BY PCR (TYPE A & B)  I-STAT CG4 LACTIC ACID, ED  CG4 I-STAT (LACTIC ACID)   ____________________________________________  EKG  ED ECG REPORT I, Anne-Caroline Mariea Clonts, the attending physician, personally viewed and interpreted this ECG.   Date: 09/21/2018  EKG Time: 1711  Rate: 77  Rhythm: normal sinus rhythm; RBBB  Axis: leftward  Intervals:none  ST&T Change: No STEMI; her baseline tracing  ____________________________________________  RADIOLOGY  Dg Chest 2 View  Result Date: 09/21/2018 CLINICAL DATA:  Pain and dyspnea with cough. EXAM: CHEST - 2 VIEW COMPARISON:  08/30/2018 FINDINGS: Stable cardiomegaly with tortuous atherosclerotic aorta. Mitral annular calcifications are redemonstrated. Lungs are clear without acute pulmonary consolidation, effusion or pneumothorax. Chronic elevation the right hemidiaphragm. Mild dextroconvex curvature of the midthoracic spine. 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 IMPRESSION: Stable cardiomegaly with tortuous atherosclerotic aorta. No acute pulmonary abnormality. Electronically Signed   By: Ashley Royalty M.D.   On: 09/21/2018 18:23   Ct Angio Chest Pe W And/or Wo Contrast  Result Date: 09/21/2018 CLINICAL DATA:  Evaluate for pulmonary embolism. EXAM: CT ANGIOGRAPHY CHEST WITH CONTRAST TECHNIQUE: Multidetector CT imaging of the chest was performed using the standard protocol during bolus administration of intravenous contrast. Multiplanar CT image reconstructions and MIPs were obtained to  evaluate the vascular anatomy. CONTRAST:  56mL OMNIPAQUE IOHEXOL 350 MG/ML SOLN COMPARISON:  Chest CT - 05/18/2018; 09/03/2015 FINDINGS: Vascular Findings: There is adequate opacification of the pulmonary arterial system with the main pulmonary artery measuring 291 Hounsfield units. There are no discrete filling defects within the pulmonary arterial tree to suggest pulmonary embolism. Re demonstrated marked enlargement of the caliber of the main pulmonary artery measuring 45 mm in diameter. Cardiomegaly. No pericardial effusion. Exuberant calcifications of the mitral valve annulus. Grossly unchanged mild fusiform aneurysmal dilatation of the ascending thoracic aorta measuring approximately 41 mm in diameter. Conventional configuration of the aortic arch. The branch vessels of the aortic arch appear widely patent throughout their imaged course. Review of the MIP images confirms the above findings. ---------------------------------------------------------------------------------- Nonvascular Findings: Mediastinum/Lymph Nodes: No bulky mediastinal, hilar or axillary lymphadenopathy. Lungs/Pleura: Evaluation the pulmonary parenchyma is minimally degraded secondary to patient respiratory artifact. There is chronic partial atelectasis/collapse of the right lower lobe. No discrete focal airspace opacities. No pleural effusion or pneumothorax. The central pulmonary airways appear widely patent. No discrete pulmonary nodules. Upper abdomen: Limited early arterial phase evaluation the upper abdomen demonstrates a partially exophytic hypoattenuating at least 4.6 cm hypoattenuating cyst arising from the superior pole of the right kidney, incompletely imaged. Note is made of a small splenule. Musculoskeletal: No acute or aggressive osseous abnormalities. Scoliotic curvature of the thoracolumbar spine stigmata of DISH with associated ankylosis throughout the thoracic spine. Severe degenerative change of the bilateral glenohumeral  joints with joint space loss, subchondral sclerosis and osteophytosis. Note is made of a partially calcified approximately 2.7 x 1.9 cm nodule arising from the inferior aspect of the right lobe of the thyroid (axial image 20, series 4; coronal image 29, series 7), similar to the 08/2015 examination. IMPRESSION: 1. No acute cardiopulmonary disease. Specifically, no evidence of pulmonary embolism or focal airspace opacity to suggest pneumonia. 2. Similar findings of cardiomegaly enlargement of the main pulmonary artery as could be seen in the setting of pulmonary arterial hypertension. 3. Chronic partial atelectasis/collapse of the right lower lobe. 4. Mild fusiform aneurysmal dilatation of the ascending thoracic aorta  measuring 41 mm in diameter, similar to at least the 08/2015 examination. Aortic aneurysm NOS (ICD10-I71.9). 5.  Aortic Atherosclerosis (ICD10-I70.0). Electronically Signed   By: Sandi Mariscal M.D.   On: 09/21/2018 19:49    ____________________________________________   PROCEDURES  Procedure(s) performed: None  Procedures  Critical Care performed: Yes, see critical care note(s) ____________________________________________   INITIAL IMPRESSION / ASSESSMENT AND PLAN / ED COURSE  Pertinent labs & imaging results that were available during my care of the patient were reviewed by me and considered in my medical decision making (see chart for details).  84 y.o. female with COPD, CHF, history of PE, presenting with wheezing, shortness of breath with hypoxia and new cough.  Overall, the patient is stable on her baseline O2 here.  However, I am concerned about multiple possible etiologies.  The patient was recently in the hospital and discharged 09/12/2018 after GI bleed and was briefly off of her Eliquis, now restarted.  PE study has been ordered.  In addition, I am concerned about hospital acquired pneumonia given her recent hospital stay.  Blood cultures and antibiotics, as well as a VBG,  have been ordered.  A COPD exacerbation is also possible.  Patient will be treated with a DuoNeb.  Steroids will be added if indicated based on her results.  Will rule out influenza.  Her EKG is reassuring without any ischemia or arrhythmias, but we will continue to monitor for this and a BNP as well as troponin are pending.  Certainly, the patient does have some peripheral edema and CHF exacerbation is also possible.  The patient will require admission to the hospital.  ----------------------------------------- 10:52 PM on 09/21/2018 -----------------------------------------  The patient will be admitted to the hospital at this time for likely COPD exacerbation with hypercarbia and hypoxia.  The patient CT scan does not show PE, nor any evidence of pneumonia.  She has been given a DuoNeb, and will receive Solu-Medrol at this time.  She did initially receive empiric antibiotics, which will not need to be continued for pulmonary infection, but she also did get coverage for UTI.  Urine culture has been sent.  At this time, the patient remains hemodynamically stable on 2 L nasal cannula and will be admitted to the hospital for continued evaluation and treatment.  ____________________________________________  FINAL CLINICAL IMPRESSION(S) / ED DIAGNOSES  Final diagnoses:  Hypercarbia  Hypoxia  Elevated troponin  Acute cystitis without hematuria         NEW MEDICATIONS STARTED DURING THIS VISIT:  New Prescriptions   No medications on file      Eula Listen, MD 09/21/18 2253

## 2018-09-21 NOTE — ED Notes (Signed)
Pt back from CT

## 2018-09-21 NOTE — ED Notes (Signed)
Pt cleaned up and sheets changed. Pt repositioned in bed. External catheter secured.

## 2018-09-21 NOTE — ED Notes (Signed)
Pt cleaned up and external cathter applied. Pt given warm blankets and resting comfortably at this time.

## 2018-09-21 NOTE — ED Triage Notes (Addendum)
Pt comes via [facility] from home with c/o possible pneumonia. Pt states pain when she coughs. VSS. Pt is alert.  EMS reports palliative nurse requested patient to be sent to hospital for xrays.  Pt O2 at 89% room air once hooked to monitor. O2 applied at 2L nasal cannula with improvement to 92%.

## 2018-09-21 NOTE — ED Notes (Signed)
Called patient's granddaughter [name] and updated her on pt's plan of care. Will inform next nurse to give any more new information to granddaughter regarding admission or discharge.

## 2018-09-21 NOTE — ED Notes (Signed)
Patient transported to CT 

## 2018-09-22 DIAGNOSIS — J441 Chronic obstructive pulmonary disease with (acute) exacerbation: Secondary | ICD-10-CM | POA: Diagnosis present

## 2018-09-22 DIAGNOSIS — I110 Hypertensive heart disease with heart failure: Secondary | ICD-10-CM | POA: Diagnosis present

## 2018-09-22 DIAGNOSIS — I5032 Chronic diastolic (congestive) heart failure: Secondary | ICD-10-CM | POA: Diagnosis present

## 2018-09-22 DIAGNOSIS — E538 Deficiency of other specified B group vitamins: Secondary | ICD-10-CM | POA: Diagnosis present

## 2018-09-22 DIAGNOSIS — M19019 Primary osteoarthritis, unspecified shoulder: Secondary | ICD-10-CM | POA: Diagnosis present

## 2018-09-22 DIAGNOSIS — Z86711 Personal history of pulmonary embolism: Secondary | ICD-10-CM | POA: Diagnosis not present

## 2018-09-22 DIAGNOSIS — R0689 Other abnormalities of breathing: Secondary | ICD-10-CM | POA: Diagnosis present

## 2018-09-22 DIAGNOSIS — J9621 Acute and chronic respiratory failure with hypoxia: Secondary | ICD-10-CM | POA: Diagnosis present

## 2018-09-22 DIAGNOSIS — I69354 Hemiplegia and hemiparesis following cerebral infarction affecting left non-dominant side: Secondary | ICD-10-CM | POA: Diagnosis not present

## 2018-09-22 DIAGNOSIS — Z90710 Acquired absence of both cervix and uterus: Secondary | ICD-10-CM | POA: Diagnosis not present

## 2018-09-22 DIAGNOSIS — Z833 Family history of diabetes mellitus: Secondary | ICD-10-CM | POA: Diagnosis not present

## 2018-09-22 DIAGNOSIS — R451 Restlessness and agitation: Secondary | ICD-10-CM | POA: Diagnosis not present

## 2018-09-22 DIAGNOSIS — Z7901 Long term (current) use of anticoagulants: Secondary | ICD-10-CM | POA: Diagnosis not present

## 2018-09-22 DIAGNOSIS — I712 Thoracic aortic aneurysm, without rupture: Secondary | ICD-10-CM | POA: Diagnosis present

## 2018-09-22 DIAGNOSIS — A4150 Gram-negative sepsis, unspecified: Secondary | ICD-10-CM | POA: Diagnosis present

## 2018-09-22 DIAGNOSIS — Z8249 Family history of ischemic heart disease and other diseases of the circulatory system: Secondary | ICD-10-CM | POA: Diagnosis not present

## 2018-09-22 DIAGNOSIS — F0150 Vascular dementia without behavioral disturbance: Secondary | ICD-10-CM | POA: Diagnosis present

## 2018-09-22 DIAGNOSIS — D45 Polycythemia vera: Secondary | ICD-10-CM | POA: Diagnosis present

## 2018-09-22 DIAGNOSIS — Z86718 Personal history of other venous thrombosis and embolism: Secondary | ICD-10-CM | POA: Diagnosis not present

## 2018-09-22 DIAGNOSIS — Z79899 Other long term (current) drug therapy: Secondary | ICD-10-CM | POA: Diagnosis not present

## 2018-09-22 DIAGNOSIS — Z808 Family history of malignant neoplasm of other organs or systems: Secondary | ICD-10-CM | POA: Diagnosis not present

## 2018-09-22 DIAGNOSIS — Z96653 Presence of artificial knee joint, bilateral: Secondary | ICD-10-CM | POA: Diagnosis present

## 2018-09-22 DIAGNOSIS — N3000 Acute cystitis without hematuria: Secondary | ICD-10-CM | POA: Diagnosis present

## 2018-09-22 DIAGNOSIS — A419 Sepsis, unspecified organism: Secondary | ICD-10-CM | POA: Diagnosis present

## 2018-09-22 DIAGNOSIS — Z8261 Family history of arthritis: Secondary | ICD-10-CM | POA: Diagnosis not present

## 2018-09-22 DIAGNOSIS — Z825 Family history of asthma and other chronic lower respiratory diseases: Secondary | ICD-10-CM | POA: Diagnosis not present

## 2018-09-22 LAB — BLOOD CULTURE ID PANEL (REFLEXED)
Acinetobacter baumannii: NOT DETECTED
CANDIDA KRUSEI: NOT DETECTED
CANDIDA PARAPSILOSIS: NOT DETECTED
CANDIDA TROPICALIS: NOT DETECTED
Candida albicans: NOT DETECTED
Candida glabrata: NOT DETECTED
Carbapenem resistance: NOT DETECTED
Enterobacter cloacae complex: DETECTED — AB
Enterobacteriaceae species: DETECTED — AB
Enterococcus species: NOT DETECTED
Escherichia coli: NOT DETECTED
Haemophilus influenzae: NOT DETECTED
KLEBSIELLA OXYTOCA: NOT DETECTED
KLEBSIELLA PNEUMONIAE: NOT DETECTED
Listeria monocytogenes: NOT DETECTED
Neisseria meningitidis: NOT DETECTED
Proteus species: NOT DETECTED
Pseudomonas aeruginosa: NOT DETECTED
SERRATIA MARCESCENS: NOT DETECTED
STAPHYLOCOCCUS SPECIES: NOT DETECTED
STREPTOCOCCUS SPECIES: NOT DETECTED
Staphylococcus aureus (BCID): NOT DETECTED
Streptococcus agalactiae: NOT DETECTED
Streptococcus pneumoniae: NOT DETECTED
Streptococcus pyogenes: NOT DETECTED

## 2018-09-22 LAB — CBC
HCT: 32.6 % — ABNORMAL LOW (ref 36.0–46.0)
Hemoglobin: 9.2 g/dL — ABNORMAL LOW (ref 12.0–15.0)
MCH: 24.9 pg — AB (ref 26.0–34.0)
MCHC: 28.2 g/dL — AB (ref 30.0–36.0)
MCV: 88.3 fL (ref 80.0–100.0)
PLATELETS: 243 10*3/uL (ref 150–400)
RBC: 3.69 MIL/uL — ABNORMAL LOW (ref 3.87–5.11)
RDW: 31.2 % — ABNORMAL HIGH (ref 11.5–15.5)
WBC: 12.9 10*3/uL — ABNORMAL HIGH (ref 4.0–10.5)
nRBC: 3 % — ABNORMAL HIGH (ref 0.0–0.2)

## 2018-09-22 LAB — BLOOD GAS, ARTERIAL
Acid-Base Excess: 3.2 mmol/L — ABNORMAL HIGH (ref 0.0–2.0)
BICARBONATE: 28.5 mmol/L — AB (ref 20.0–28.0)
FIO2: 0.21
O2 SAT: 91 %
PCO2 ART: 46 mmHg (ref 32.0–48.0)
PO2 ART: 61 mmHg — AB (ref 83.0–108.0)
Patient temperature: 37
pH, Arterial: 7.4 (ref 7.350–7.450)

## 2018-09-22 LAB — BASIC METABOLIC PANEL
ANION GAP: 9 (ref 5–15)
BUN: 12 mg/dL (ref 8–23)
CALCIUM: 8.4 mg/dL — AB (ref 8.9–10.3)
CO2: 28 mmol/L (ref 22–32)
Chloride: 107 mmol/L (ref 98–111)
Creatinine, Ser: 0.37 mg/dL — ABNORMAL LOW (ref 0.44–1.00)
GFR calc Af Amer: >60 mL/min (ref >60)
GLUCOSE: 124 mg/dL — AB (ref 70–99)
Potassium: 4.3 mmol/L (ref 3.5–5.1)
SODIUM: 144 mmol/L (ref 135–145)

## 2018-09-22 LAB — T4, FREE: Free T4: 1.05 ng/dL (ref 0.82–1.77)

## 2018-09-22 LAB — GLUCOSE, CAPILLARY: Glucose-Capillary: 116 mg/dL — ABNORMAL HIGH (ref 70–99)

## 2018-09-22 LAB — TSH: TSH: 1.769 u[IU]/mL (ref 0.350–4.500)

## 2018-09-22 MED ORDER — PANTOPRAZOLE SODIUM 40 MG PO TBEC
40.0000 mg | DELAYED_RELEASE_TABLET | Freq: Every day | ORAL | Status: DC
  Administered 2018-09-22 – 2018-09-25 (×4): 40 mg via ORAL
  Filled 2018-09-22 (×4): qty 1

## 2018-09-22 MED ORDER — HYDROCODONE-ACETAMINOPHEN 5-325 MG PO TABS: 1.0000 | ORAL_TABLET | ORAL | Status: DC | PRN

## 2018-09-22 MED ORDER — METHYLPREDNISOLONE SODIUM SUCC 40 MG IJ SOLR
40.0000 mg | Freq: Every day | INTRAMUSCULAR | Status: DC
  Administered 2018-09-23: 40 mg via INTRAVENOUS
  Filled 2018-09-22: qty 1

## 2018-09-22 MED ORDER — RUXOLITINIB PHOSPHATE 15 MG PO TABS
15.0000 mg | ORAL_TABLET | Freq: Two times a day (BID) | ORAL | Status: DC
  Administered 2018-09-22 – 2018-09-25 (×7): 15 mg via ORAL
  Filled 2018-09-22 (×8): qty 1

## 2018-09-22 MED ORDER — METHYLPREDNISOLONE SODIUM SUCC 125 MG IJ SOLR
80.0000 mg | Freq: Two times a day (BID) | INTRAMUSCULAR | Status: DC
  Administered 2018-09-22: 80 mg via INTRAVENOUS
  Filled 2018-09-22: qty 2

## 2018-09-22 MED ORDER — ACETAMINOPHEN 650 MG RE SUPP: 650.0000 mg | Freq: Four times a day (QID) | RECTAL | Status: DC | PRN

## 2018-09-22 MED ORDER — TRAZODONE HCL 50 MG PO TABS
25.0000 mg | ORAL_TABLET | Freq: Every evening | ORAL | Status: DC | PRN
  Administered 2018-09-22: 25 mg via ORAL
  Filled 2018-09-22: qty 1

## 2018-09-22 MED ORDER — SODIUM CHLORIDE 0.9 % IV SOLN
1.0000 g | Freq: Three times a day (TID) | INTRAVENOUS | Status: DC
  Administered 2018-09-22 – 2018-09-25 (×10): 1 g via INTRAVENOUS
  Filled 2018-09-22 (×14): qty 1

## 2018-09-22 MED ORDER — SODIUM CHLORIDE 0.9 % IV SOLN
3.0000 g | Freq: Four times a day (QID) | INTRAVENOUS | Status: DC
  Administered 2018-09-22: 3 g via INTRAVENOUS
  Filled 2018-09-22 (×3): qty 3

## 2018-09-22 MED ORDER — METHYLPREDNISOLONE SODIUM SUCC 125 MG IJ SOLR: 80.0000 mg | Freq: Two times a day (BID) | INTRAMUSCULAR | Status: DC

## 2018-09-22 MED ORDER — ONDANSETRON HCL 4 MG/2ML IJ SOLN: 4.0000 mg | Freq: Four times a day (QID) | INTRAMUSCULAR | Status: DC | PRN

## 2018-09-22 MED ORDER — ONDANSETRON HCL 4 MG PO TABS: 4.0000 mg | ORAL_TABLET | Freq: Four times a day (QID) | ORAL | Status: DC | PRN

## 2018-09-22 MED ORDER — FERROUS SULFATE 325 (65 FE) MG PO TABS
325.0000 mg | ORAL_TABLET | Freq: Every day | ORAL | Status: DC
  Administered 2018-09-22 – 2018-09-25 (×4): 325 mg via ORAL
  Filled 2018-09-22 (×4): qty 1

## 2018-09-22 MED ORDER — SODIUM CHLORIDE 0.9 % IV SOLN
1.0000 g | INTRAVENOUS | Status: DC
  Administered 2018-09-22: 1 g via INTRAVENOUS
  Filled 2018-09-22 (×3): qty 10

## 2018-09-22 MED ORDER — DOCUSATE SODIUM 100 MG PO CAPS
100.0000 mg | ORAL_CAPSULE | Freq: Two times a day (BID) | ORAL | Status: DC
  Administered 2018-09-22 – 2018-09-25 (×7): 100 mg via ORAL
  Filled 2018-09-22 (×7): qty 1

## 2018-09-22 MED ORDER — IPRATROPIUM-ALBUTEROL 0.5-2.5 (3) MG/3ML IN SOLN
3.0000 mL | Freq: Four times a day (QID) | RESPIRATORY_TRACT | Status: DC
  Administered 2018-09-22 (×3): 3 mL via RESPIRATORY_TRACT
  Filled 2018-09-22 (×3): qty 3

## 2018-09-22 MED ORDER — BISACODYL 5 MG PO TBEC: 5.0000 mg | DELAYED_RELEASE_TABLET | Freq: Every day | ORAL | Status: DC | PRN

## 2018-09-22 MED ORDER — VITAMIN D3 25 MCG (1000 UNIT) PO TABS
1000.0000 [IU] | ORAL_TABLET | Freq: Every day | ORAL | Status: DC
  Administered 2018-09-22 – 2018-09-25 (×4): 1000 [IU] via ORAL
  Filled 2018-09-22 (×5): qty 1

## 2018-09-22 MED ORDER — IPRATROPIUM-ALBUTEROL 0.5-2.5 (3) MG/3ML IN SOLN
3.0000 mL | Freq: Four times a day (QID) | RESPIRATORY_TRACT | Status: DC | PRN
  Administered 2018-09-24: 3 mL via RESPIRATORY_TRACT
  Filled 2018-09-22: qty 3

## 2018-09-22 MED ORDER — GUAIFENESIN-DM 100-10 MG/5ML PO SYRP
5.0000 mL | ORAL_SOLUTION | ORAL | Status: DC | PRN
  Administered 2018-09-22: 5 mL via ORAL
  Filled 2018-09-22: qty 5

## 2018-09-22 MED ORDER — FOLIC ACID 1 MG PO TABS
0.5000 mg | ORAL_TABLET | Freq: Every day | ORAL | Status: DC
  Administered 2018-09-22 – 2018-09-25 (×4): 0.5 mg via ORAL
  Filled 2018-09-22 (×4): qty 1

## 2018-09-22 MED ORDER — METOPROLOL TARTRATE 25 MG PO TABS
12.5000 mg | ORAL_TABLET | Freq: Two times a day (BID) | ORAL | Status: DC
  Administered 2018-09-22 – 2018-09-25 (×7): 12.5 mg via ORAL
  Filled 2018-09-22 (×7): qty 1

## 2018-09-22 MED ORDER — AMLODIPINE BESYLATE 5 MG PO TABS
5.0000 mg | ORAL_TABLET | Freq: Every day | ORAL | Status: DC
  Administered 2018-09-22 – 2018-09-24 (×3): 5 mg via ORAL
  Filled 2018-09-22 (×4): qty 1

## 2018-09-22 MED ORDER — FOLIC ACID 400 MCG PO TABS: 400.0000 ug | ORAL_TABLET | Freq: Every day | ORAL | Status: DC

## 2018-09-22 MED ORDER — APIXABAN 5 MG PO TABS
5.0000 mg | ORAL_TABLET | Freq: Two times a day (BID) | ORAL | Status: DC
  Administered 2018-09-22 – 2018-09-25 (×7): 5 mg via ORAL
  Filled 2018-09-22 (×7): qty 1

## 2018-09-22 MED ORDER — SODIUM CHLORIDE 0.9 % IV SOLN
Freq: Once | INTRAVENOUS | Status: AC
  Administered 2018-09-22: 03:00:00 via INTRAVENOUS

## 2018-09-22 MED ORDER — ESTRADIOL 0.1 MG/GM VA CREA
1.0000 | TOPICAL_CREAM | VAGINAL | Status: DC
  Administered 2018-09-22 – 2018-09-24 (×2): 1 via VAGINAL
  Filled 2018-09-22 (×2): qty 42.5

## 2018-09-22 MED ORDER — ACETAMINOPHEN 325 MG PO TABS
650.0000 mg | ORAL_TABLET | Freq: Four times a day (QID) | ORAL | Status: DC | PRN
  Administered 2018-09-24: 650 mg via ORAL
  Filled 2018-09-22: qty 2

## 2018-09-22 NOTE — Progress Notes (Signed)
Patient ID: Yolanda Wells, female   DOB: August 26, 1932, 84 y.o.   MRN: [id number]  [facility] PROGRESS NOTE  Getsemani Lindon AYT:[id number] DOB: November 27, 1931 DOA: 09/21/2018 PCP: [name], MD  HPI/Subjective: Patient feels okay.  Offers no complaints.  States her breathing is better.  No complaints of abdominal pain, diarrhea or burning on urination.  Some cough.  Objective: Vitals:   09/22/18 1340 09/22/18 1426  BP:    Pulse:    Resp:    Temp:    SpO2: 95% 97%    Filed Weights   09/21/18 1659 09/22/18 0134  Weight: 91.6 kg 90.9 kg    ROS: Review of Systems  Constitutional: Negative for chills and fever.  Eyes: Negative for blurred vision.  Respiratory: Positive for cough. Negative for shortness of breath.   Cardiovascular: Negative for chest pain.  Gastrointestinal: Negative for abdominal pain, constipation, diarrhea, nausea and vomiting.  Genitourinary: Negative for dysuria.  Musculoskeletal: Negative for joint pain.  Neurological: Negative for dizziness and headaches.   Exam: Physical Exam  HENT:  Nose: No mucosal edema.  Mouth/Throat: No oropharyngeal exudate or posterior oropharyngeal edema.  Eyes: Pupils are equal, round, and reactive to light. Conjunctivae, EOM and lids are normal.  Neck: No JVD present. Carotid bruit is not present. No edema present. No thyroid mass and no thyromegaly present.  Cardiovascular: S1 normal and S2 normal. Exam reveals no gallop.  No murmur heard. Pulses:      Dorsalis pedis pulses are 2+ on the right side, and 2+ on the left side.  Respiratory: No respiratory distress. She has decreased breath sounds in the right lower field and the left lower field. She has no wheezes. She has no rhonchi. She has no rales.  GI: Soft. Bowel sounds are normal. There is no tenderness.  Musculoskeletal:       Right ankle: She exhibits swelling.       Left ankle: She exhibits swelling.  Previous right toe amputations  Lymphadenopathy:    She  has no cervical adenopathy.  Neurological: She is alert. No cranial nerve deficit.  Baseline left-sided weakness from stroke.  Skin: Skin is warm. No rash noted. Nails show no clubbing.  Psychiatric: She has a normal mood and affect.      Data Reviewed: Basic Metabolic Panel: Recent Labs  Lab 09/21/18 1705 09/22/18 0352  NA 145 144  K 4.1 4.3  CL 106 107  CO2 31 28  GLUCOSE 109* 124*  BUN 14 12  CREATININE 0.64 0.37*  CALCIUM 9.0 8.4*   Liver Function Tests: Recent Labs  Lab 09/21/18 1705  AST 19  ALT 10  ALKPHOS 71  BILITOT 0.7  PROT 7.4  ALBUMIN 3.2*   CBC: Recent Labs  Lab 09/21/18 1705 09/22/18 0352  WBC 15.0* 12.9*  HGB 9.6* 9.2*  HCT 33.5* 32.6*  MCV 86.3 88.3  PLT 338 243   Cardiac Enzymes: Recent Labs  Lab 09/21/18 1705  TROPONINI 0.03*   BNP (last 3 results) Recent Labs    05/17/18 2036 06/21/18 0500 09/21/18 1705  BNP 74.0 30.0 72.0     CBG: Recent Labs  Lab 09/22/18 0749  GLUCAP 116*    Recent Results (from the past 240 hour(s))  Blood culture (routine x 2)     Status: None (Preliminary result)   Collection Time: 09/21/18  6:23 PM  Result Value Ref Range Status   Specimen Description BLOOD RIGHT ANTECUBITAL  Final   Special Requests   Final  BOTTLES DRAWN AEROBIC AND ANAEROBIC Blood Culture adequate volume   Culture   Final    NO GROWTH < 24 HOURS Performed at [facility], [address] [place], [place] [address]    Report Status PENDING  Incomplete  Blood culture (routine x 2)     Status: None (Preliminary result)   Collection Time: 09/21/18  6:23 PM  Result Value Ref Range Status   Specimen Description BLOOD LEFT ANTECUBITAL  Final   Special Requests   Final    BOTTLES DRAWN AEROBIC AND ANAEROBIC Blood Culture adequate volume   Culture  Setup Time   Final    Organism ID to follow GRAM NEGATIVE RODS AEROBIC BOTTLE ONLY CRITICAL RESULT CALLED TO, READ BACK BY AND VERIFIED WITH: KAREN HAYES 09/22/18  [place] Performed at [facility], [address] [place], [place] [address]    Culture GRAM NEGATIVE RODS  Final   Report Status PENDING  Incomplete  Blood Culture ID Panel (Reflexed)     Status: Abnormal   Collection Time: 09/21/18  6:23 PM  Result Value Ref Range Status   Enterococcus species NOT DETECTED NOT DETECTED Final   Listeria monocytogenes NOT DETECTED NOT DETECTED Final   Staphylococcus species NOT DETECTED NOT DETECTED Final   Staphylococcus aureus (BCID) NOT DETECTED NOT DETECTED Final   Streptococcus species NOT DETECTED NOT DETECTED Final   Streptococcus agalactiae NOT DETECTED NOT DETECTED Final   Streptococcus pneumoniae NOT DETECTED NOT DETECTED Final   Streptococcus pyogenes NOT DETECTED NOT DETECTED Final   Acinetobacter baumannii NOT DETECTED NOT DETECTED Final   Enterobacteriaceae species DETECTED (A) NOT DETECTED Final    Comment: Enterobacteriaceae represent a large family of gram-negative bacteria, not a single organism. CRITICAL RESULT CALLED TO, READ BACK BY AND VERIFIED WITH: KAREN HAYES 09/22/18 1253 KLW    Enterobacter cloacae complex DETECTED (A) NOT DETECTED Final    Comment: CRITICAL RESULT CALLED TO, READ BACK BY AND VERIFIED WITH: KAREN HAYES 09/22/18 1253 KLW    Escherichia coli NOT DETECTED NOT DETECTED Final   Klebsiella oxytoca NOT DETECTED NOT DETECTED Final   Klebsiella pneumoniae NOT DETECTED NOT DETECTED Final   Proteus species NOT DETECTED NOT DETECTED Final   Serratia marcescens NOT DETECTED NOT DETECTED Final   Carbapenem resistance NOT DETECTED NOT DETECTED Final   Haemophilus influenzae NOT DETECTED NOT DETECTED Final   Neisseria meningitidis NOT DETECTED NOT DETECTED Final   Pseudomonas aeruginosa NOT DETECTED NOT DETECTED Final   Candida albicans NOT DETECTED NOT DETECTED Final   Candida glabrata NOT DETECTED NOT DETECTED Final   Candida krusei NOT DETECTED NOT DETECTED Final   Candida parapsilosis NOT DETECTED NOT  DETECTED Final   Candida tropicalis NOT DETECTED NOT DETECTED Final    Comment: Performed at [facility], [address] [place], [place] [address]     Studies: Dg Chest 2 View  Result Date: 09/21/2018 CLINICAL DATA:  Pain and dyspnea with cough. EXAM: CHEST - 2 VIEW COMPARISON:  08/30/2018 FINDINGS: Stable cardiomegaly with tortuous atherosclerotic aorta. Mitral annular calcifications are redemonstrated. Lungs are clear without acute pulmonary consolidation, effusion or pneumothorax. Chronic elevation the right hemidiaphragm. Mild dextroconvex curvature of the midthoracic spine. 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 IMPRESSION: Stable cardiomegaly with tortuous atherosclerotic aorta. No acute pulmonary abnormality. Electronically Signed   By: Ashley Royalty M.D.   On: 09/21/2018 18:23   Ct Angio Chest Pe W And/or Wo Contrast  Result Date: 09/21/2018 CLINICAL DATA:  Evaluate for pulmonary embolism. EXAM: CT ANGIOGRAPHY CHEST WITH CONTRAST TECHNIQUE: Multidetector CT imaging of the chest was performed using the standard protocol during bolus administration of intravenous contrast. Multiplanar CT image reconstructions and MIPs were obtained to evaluate the vascular anatomy. CONTRAST:  61mL OMNIPAQUE IOHEXOL 350 MG/ML SOLN COMPARISON:  Chest CT - 05/18/2018; 09/03/2015 FINDINGS: Vascular Findings: There is adequate opacification of the pulmonary arterial system with the main pulmonary artery measuring 291 Hounsfield units. There are no discrete filling defects within the pulmonary arterial tree to suggest pulmonary embolism. Re demonstrated marked enlargement of the caliber of the main pulmonary artery measuring 45 mm in diameter. Cardiomegaly. No pericardial effusion. Exuberant calcifications of the mitral valve annulus. Grossly unchanged mild fusiform aneurysmal dilatation of the ascending thoracic aorta measuring approximately 41 mm in diameter. Conventional  configuration of the aortic arch. The branch vessels of the aortic arch appear widely patent throughout their imaged course. Review of the MIP images confirms the above findings. ---------------------------------------------------------------------------------- Nonvascular Findings: Mediastinum/Lymph Nodes: No bulky mediastinal, hilar or axillary lymphadenopathy. Lungs/Pleura: Evaluation the pulmonary parenchyma is minimally degraded secondary to patient respiratory artifact. There is chronic partial atelectasis/collapse of the right lower lobe. No discrete focal airspace opacities. No pleural effusion or pneumothorax. The central pulmonary airways appear widely patent. No discrete pulmonary nodules. Upper abdomen: Limited early arterial phase evaluation the upper abdomen demonstrates a partially exophytic hypoattenuating at least 4.6 cm hypoattenuating cyst arising from the superior pole of the right kidney, incompletely imaged. Note is made of a small splenule. Musculoskeletal: No acute or aggressive osseous abnormalities. Scoliotic curvature of the thoracolumbar spine stigmata of DISH with associated ankylosis throughout the thoracic spine. Severe degenerative change of the bilateral glenohumeral joints with joint space loss, subchondral sclerosis and osteophytosis. Note is made of a partially calcified approximately 2.7 x 1.9 cm nodule arising from the inferior aspect of the right lobe of the thyroid (axial image 20, series 4; coronal image 29, series 7), similar to the 08/2015 examination. IMPRESSION: 1. No acute cardiopulmonary disease. Specifically, no evidence of pulmonary embolism or focal airspace opacity to suggest pneumonia. 2. Similar findings of cardiomegaly enlargement of the main pulmonary artery as could be seen in the setting of pulmonary arterial hypertension. 3. Chronic partial atelectasis/collapse of the right lower lobe. 4. Mild fusiform aneurysmal dilatation of the ascending thoracic aorta  measuring 41 mm in diameter, similar to at least the 08/2015 examination. Aortic aneurysm NOS (ICD10-I71.9). 5.  Aortic Atherosclerosis (ICD10-I70.0). Electronically Signed   By: Sandi Mariscal M.D.   On: 09/21/2018 19:49    Scheduled Meds: . amLODipine  5 mg Oral Daily  . apixaban  5 mg Oral BID  . cholecalciferol  1,000 Units Oral Daily  . docusate sodium  100 mg Oral BID  . estradiol  1 Applicatorful Vaginal Once per day on Mon Wed Fri  . ferrous sulfate  325 mg Oral Daily  . folic acid  0.5 mg Oral Daily  . ipratropium-albuterol  3 mL Nebulization Q6H  . [START ON 09/23/2018] methylPREDNISolone (SOLU-MEDROL) injection  40 mg Intravenous Daily  . metoprolol tartrate  12.5 mg Oral BID  . pantoprazole  40 mg Oral Daily  . ruxolitinib phosphate  15 mg Oral BID   Continuous Infusions: . ceFEPime (MAXIPIME) IV 1 g (09/22/18 1425)    Assessment/Plan:  1. Sepsis with gram-negative rods in the blood cultures.  On Maxipime.  Follow-up cultures and sensitivities. 2. COPD exacerbation.  Drop down the dose of Solu-Medrol to 40  mg daily continue nebulizer treatments. 3. Essential hypertension on Norvasc 4. Polycythemia vera on Barnabas Lister if he 5. Previous stroke and chronic left-sided weakness 6. History of DVT and PE.  Recent lower GI bleed.  Monitor hemoglobin.  Patient currently on Eliquis.  Aspirin held.  Code Status:     Code Status Orders  (From admission, onward)         Start     Ordered   09/22/18 0134  Full code  Continuous     09/22/18 0133        Code Status History    Date Active Date Inactive Code Status Order ID Comments User Context   09/09/2018 1240 09/12/2018 2008 Full Code [id number]  Loletha Grayer, MD ED   08/30/2018 2018 09/01/2018 2004 DNR [id number]  Nicholes Mango, MD ED   08/30/2018 1903 08/30/2018 2018 Full Code [id number]  Nicholes Mango, MD ED   08/13/2018 1038 08/15/2018 1836 Full Code [id number]  Loletha Grayer, MD Inpatient   08/12/2018 2001 08/13/2018 1038 DNR  [id number]  Salary, Avel Peace, MD ED   08/12/2018 2001 08/12/2018 2001 Full Code [id number]  Salary, Avel Peace, MD ED   06/15/2018 1635 06/18/2018 2009 Full Code [id number]  [name], MD Inpatient   02/24/2018 0347 02/27/2018 0027 Full Code [id number]  Amelia Jo, MD Inpatient   10/15/2017 1141 10/16/2017 2204 Full Code [id number]  Knox Royalty, NP Inpatient   10/13/2017 2246 10/15/2017 1141 DNR [id number]  Lance Coon, MD ED   10/06/2017 1454 10/09/2017 2058 DNR [id number]  Nicholes Mango, MD Inpatient   10/06/2017 1111 10/06/2017 1453 Full Code [id number]  Nicholes Mango, MD Inpatient   05/27/2017 1954 05/28/2017 2013 Full Code [id number]  Vaughan Basta, MD Inpatient   05/23/2017 2322 05/26/2017 1614 Full Code [id number]  Lance Coon, MD Inpatient   04/10/2017 0325 04/11/2017 1916 Full Code [id number]  Harrie Foreman, MD Inpatient   11/23/2016 0629 12/01/2016 1935 Full Code [id number]  Harrie Foreman, MD Inpatient   08/26/2016 0445 08/26/2016 1923 Full Code [id number]  Saundra Shelling, MD Inpatient   09/22/2015 1755 09/25/2015 1844 Full Code [id number]  Demetrios Loll, MD ED   09/15/2015 0341 09/17/2015 1741 Full Code [id number]  Harrie Foreman, MD Inpatient   07/16/2015 0151 07/20/2015 2334 Full Code [id number]  Lance Coon, MD Inpatient   05/25/2015 0834 05/29/2015 1616 Full Code [id number]  Harrie Foreman, MD Inpatient   04/02/2015 0606 04/08/2015 1651 Full Code [id number]  Harrie Foreman, MD Inpatient    Advance Directive Documentation     Most Recent Value  Type of Advance Directive  Healthcare Power of [place], Living will  Pre-existing out of facility DNR order (yellow form or pink MOST form)  -  "MOST" Form in Place?  -     Family Communication: Spoke with granddaughter on the phone Disposition Plan: Will need a few days of IV antibiotics and need to determine what is growing in the blood culture and what it sensitive to  Antibiotics:  Cefepime  Time spent: 35 minutes  [place]

## 2018-09-22 NOTE — Care Management (Signed)
Arterial blood gas results does not qualify for trilogy.

## 2018-09-22 NOTE — Care Management Obs Status (Signed)
[facility] NOTIFICATION   Patient Details  Name: Rebeckah Masih MRN: [id number] Date of Birth: 09-24-1932   Medicare Observation Status Notification Given:  Yes  Declined to sign notice.  Wants to defer to her granddaughter .  left notice in room   Katrina Stack, RN 09/22/2018, 2:10 PM

## 2018-09-22 NOTE — ED Notes (Signed)
Notified granddaughter, [name], that patient was being admitted to room 238.  Notified [place], RN that [name] will bring patients chemo medication (Jakafi) to the hospital in the morning.

## 2018-09-22 NOTE — Care Management Note (Signed)
Case Management Note  Patient Details  Name: Lynnsie Linders MRN: [id number] Date of Birth: [birth year]  Subjective/Objective:               Placed in observation for exac of copd. Has diagnosis of severe advanced COPD. She has been admitted and placed in observation for a total of five times since August.  Elevated PCO2 of 66 on venous sample.  It is documented patient was sent to the ED by palliative nurse for X-rays to rule out pneumonia. Patient is followed by outpatient palliative with [facility].  Patient is also currently followed by [facility] RN PT Aide and social work.  Notified Cheryl with [facility] of patient being placed in observation.  She states that agency can obtain portable chest xrays in the home.   Patient is full code. Patient says she only uses her oxygen at night and "when I need it."  Verbally confirms she does not have portable tanks in the home for travel.  have reached out to [facility] to confirm.  Also investigating whether patient would benenit from NIV.   Action/Plan:   Expected Discharge Date:                  Expected Discharge Plan:     In-House Referral:     Discharge planning Services     Post Acute Care Choice:    Choice offered to:     DME Arranged:    DME Agency:     HH Arranged:    HH Agency:     Status of Service:     If discussed at [employer] of Stay Meetings, dates discussed:    Additional Comments:  Katrina Stack, RN 09/22/2018, 9:58 AM

## 2018-09-22 NOTE — Progress Notes (Signed)
Please note patient is currently followed by out patient Palliative at home. CMRN Nann Greene made aware.  °Karen Robertson RN, BSN, CHPN °[facility], hospital Liaison °[phone] °

## 2018-09-22 NOTE — H&P (Signed)
[place] [facility] [place] NAME: Yolanda Wells    MR#:  [id number]  DATE OF BIRTH:  1932-02-25  DATE OF ADMISSION:  09/21/2018  PRIMARY CARE PHYSICIAN: [name], MD   REQUESTING/REFERRING PHYSICIAN:   CHIEF COMPLAINT:  No chief complaint on file.   HISTORY OF PRESENT ILLNESS: Yolanda Wells  is a 84 y.o. female with a known history of DVT, PE, on chronic anticoagulation with Eliquis, CHF, COPD, chronic respiratory failure on continuous 2 L oxygen nasal cannula.  Patient is following with palliative care for severe, advanced COPD.  She is full code. Per family, patient has been short of breath and noted with productive cough with brown sputum in the past 24 hours.  Her oxygen saturation has been around 89% on 2 L oxygen per nasal cannula.  No reports of fever, chills, edema, nausea, vomiting, bleeding. Blood test done in emergency room was notable for elevated WBC at 15,000 and borderline elevated troponin level is 0.03.  UA is positive for UTI. CTA of the chest is negative for PE or infiltrates. Patient is admitted for further evaluation and treatment.   PAST MEDICAL HISTORY:   Past Medical History:  Diagnosis Date  . Acute deep vein thrombosis (DVT) of distal vein of left lower extremity ([place]) 10/03/2015  . Acute pulmonary embolism ([place]) 10/03/2015  . Anticoagulant long-term use   . Arthritis   . Arthritis   . CHF (congestive heart failure) ([place])   . Chronic anticoagulation 10/03/2015  . Collagen vascular disease ([place])   . Colostomy in place [facility]) 10/04/2015  . Deep venous thrombosis ([facility])    right lower extremity  . Dependent edema   . Diverticulosis of intestine with bleeding 04/02/2015  . Glenohumeral arthritis 06/28/2012  . Hammertoe 09/02/2012  . History of bilateral hip replacements 09/02/2012  . History of hysterectomy   . Hypertension   . Hypertension   . Hypokalemia   . Inability to ambulate due to hip  10/12/2015  . Mandibular dysfunction   . Onychomycosis 09/02/2012  . Osteoarthritis   . Polycythemia vera ([place])   . Presence of IVC filter 05/25/2015  . Stroke ([place]) 03/26/2015   cerebellar  . Urinary incontinence in female 09/02/2016    PAST SURGICAL HISTORY:  Past Surgical History:  Procedure Laterality Date  . ABDOMINAL HYSTERECTOMY    . BLEPHAROPLASTY Right 03/2017   right upper eyelid  . CARDIAC CATHETERIZATION Right 04/03/2015   Procedure: CENTRAL LINE INSERTION;  Surgeon: Sherri Rad, MD;  Location: [facility] ORS;  Service: General;  Laterality: Right;  . COLECTOMY WITH COLOSTOMY CREATION/HARTMANN PROCEDURE N/A 04/03/2015   Procedure: COLECTOMY WITH COLOSTOMY CREATION/HARTMANN PROCEDURE;  Surgeon: Sherri Rad, MD;  Location: [facility] ORS;  Service: General;  Laterality: N/A;  . COLON SURGERY    . FOOT AMPUTATION     partial  . FOOT AMPUTATION Right   . IVC FILTER INSERTION    . JOINT REPLACEMENT     Left and Right Hip  . PERIPHERAL VASCULAR CATHETERIZATION N/A 04/02/2015   Procedure: Visceral Angiography;  Surgeon: Algernon Huxley, MD;  Location: [place] [facility];  Service: Cardiovascular;  Laterality: N/A;  . PERIPHERAL VASCULAR CATHETERIZATION N/A 04/02/2015   Procedure: Visceral Artery Intervention;  Surgeon: Algernon Huxley, MD;  Location: [place] [facility];  Service: Cardiovascular;  Laterality: N/A;  . PERIPHERAL VASCULAR CATHETERIZATION N/A 05/25/2015   Procedure: IVC Filter Insertion;  Surgeon: Yolanda Cabal, MD;  Location: [place] [facility]  LAB;  Service: Cardiovascular;  Laterality: N/A;  . TOE AMPUTATION     right  . TOTAL HIP ARTHROPLASTY      SOCIAL HISTORY:  Social History   Tobacco Use  . Smoking status: Never Smoker  . Smokeless tobacco: Never Used  Substance Use Topics  . Alcohol use: No    FAMILY HISTORY:  Family History  Problem Relation Age of Onset  . Brain cancer Mother   . Heart attack Father   . COPD Father   . Coronary artery disease Father   .  Hypertension Unknown   . Arthritis-Osteo Unknown   . Diabetes Brother   . Arthritis Brother   . Osteosarcoma Son   . Bone cancer Son   . Arthritis Sister   . Arthritis Brother   . Hypertension Brother   . Coronary artery disease Brother   . Malignant hyperthermia Brother     DRUG ALLERGIES: No Known Allergies  REVIEW OF SYSTEMS:   CONSTITUTIONAL: No fever, but positive for fatigue and generalized weakness.  EYES: No changes in vision.  EARS, NOSE, AND THROAT: No tinnitus or ear pain.  RESPIRATORY: [place] for productive cough, shortness of breath, wheezing; no hemoptysis.  CARDIOVASCULAR: No chest pain, orthopnea, edema.  GASTROINTESTINAL: No nausea, vomiting, diarrhea or abdominal pain.  GENITOURINARY: No dysuria, hematuria.  ENDOCRINE: No polyuria, nocturia. HEMATOLOGY: No bleeding. SKIN: No rash or lesion. MUSCULOSKELETAL: No joint pain at this time.   NEUROLOGIC: No focal weakness.  PSYCHIATRY: No anxiety or depression.   MEDICATIONS AT HOME:  Prior to Admission medications   Medication Sig Start Date End Date Taking? Authorizing Provider  amLODipine (NORVASC) 5 MG tablet Take 5 mg by mouth daily.   Yes [provider]  cholecalciferol (VITAMIN D) 1000 units tablet Take 1,000 Units by mouth daily.   Yes [provider]  ELIQUIS 5 MG TABS tablet TAKE ONE TABLET BY MOUTH TWICE A DAY Patient taking differently: Take 5 mg by mouth 2 (two) times daily.  07/12/18  Yes Lequita Asal, MD  estradiol (ESTRACE VAGINAL) 0.1 MG/GM vaginal cream Apply 0.5mg  (pea-sized amount)  just inside the vaginal introitus with a finger-tip on Monday, Wednesday and Friday nights. 08/09/18  Yes McGowan, Larene Beach A, PA-C  ferrous sulfate 325 (65 FE) MG tablet Take 1 tablet (325 mg total) by mouth daily. 09/11/18 12/10/18 Yes [name], MD  folic acid (FOLVITE) 818 MCG tablet Take 400 mcg by mouth daily.   Yes [provider]  ipratropium-albuterol (DUONEB) 0.5-2.5 (3)  MG/3ML SOLN Take 3 mLs by nebulization every 4 (four) hours as needed (for shortness of breath/wheezing).    Yes [provider]  JAKAFI 15 MG tablet TAKE 1 TABLET (15 MG TOTAL) BY MOUTH 2 TIMES DAILY. Patient taking differently: Take 15 mg by mouth 2 (two) times daily.  09/06/18  Yes Karen Kitchens, NP  metoprolol tartrate (LOPRESSOR) 25 MG tablet Take 0.5 tablets (12.5 mg total) by mouth 2 (two) times daily. 09/01/18  Yes Max Sane, MD  pantoprazole (PROTONIX) 40 MG tablet Take 1 tablet (40 mg total) by mouth daily. 09/12/18  Yes [name], MD      PHYSICAL EXAMINATION:   VITAL SIGNS: Blood pressure (!) 160/81, pulse 65, temperature 97.7 F (36.5 C), temperature source Oral, resp. rate (!) 23, height 5\' 7"  (1.702 m), weight 91.6 kg, SpO2 100 %.  GENERAL:  84 y.o.-year-old patient lying in the bed with mild respiratory distress.  She looks chronically ill, but nontoxic.  EYES: Pupils equal, round, reactive to light and accommodation. No scleral icterus. Extraocular muscles intact.  HEENT: Head atraumatic, normocephalic. Oropharynx and nasopharynx clear.  NECK:  Supple, no jugular venous distention. No thyroid enlargement, no tenderness.  LUNGS: Tachypnea.  Reduced breath sounds and scattered wheezing noted bilaterally. No use of accessory muscles of respiration.  CARDIOVASCULAR: S1, S2 normal. No S3/S4.  ABDOMEN: Soft, nontender, nondistended. Bowel sounds present. No organomegaly or mass.  EXTREMITIES: Chronic bilateral 1+ pedal edema noted.  NEUROLOGIC: No focal weakness PSYCHIATRIC: The patient is alert and oriented x 3.  SKIN: No obvious rash, lesion, or ulcer.   LABORATORY PANEL:   CBC Recent Labs  Lab 09/21/18 1705  WBC 15.0*  HGB 9.6*  HCT 33.5*  PLT 338  MCV 86.3  MCH 24.7*  MCHC 28.7*  RDW 31.7*   ------------------------------------------------------------------------------------------------------------------  Chemistries  Recent Labs  Lab  09/21/18 1705  NA 145  K 4.1  CL 106  CO2 31  GLUCOSE 109*  BUN 14  CREATININE 0.64  CALCIUM 9.0  AST 19  ALT 10  ALKPHOS 71  BILITOT 0.7   ------------------------------------------------------------------------------------------------------------------ estimated creatinine clearance is 60.8 mL/min (by C-G formula based on SCr of 0.64 mg/dL). ------------------------------------------------------------------------------------------------------------------ No results for input(s): TSH, T4TOTAL, T3FREE, THYROIDAB in the last 72 hours.  Invalid input(s): FREET3   Coagulation profile No results for input(s): INR, PROTIME in the last 168 hours. ------------------------------------------------------------------------------------------------------------------- No results for input(s): DDIMER in the last 72 hours. -------------------------------------------------------------------------------------------------------------------  Cardiac Enzymes Recent Labs  Lab 09/21/18 1705  TROPONINI 0.03*   ------------------------------------------------------------------------------------------------------------------ Invalid input(s): POCBNP  ---------------------------------------------------------------------------------------------------------------  Urinalysis    Component Value Date/Time   COLORURINE YELLOW (A) 09/21/2018 1824   APPEARANCEUR HAZY (A) 09/21/2018 1824   APPEARANCEUR Hazy 07/08/2014 0956   LABSPEC 1.027 09/21/2018 1824   LABSPEC 1.019 07/08/2014 0956   PHURINE 5.0 09/21/2018 1824   GLUCOSEU NEGATIVE 09/21/2018 1824   GLUCOSEU Negative 07/08/2014 0956   HGBUR NEGATIVE 09/21/2018 1824   BILIRUBINUR NEGATIVE 09/21/2018 1824   BILIRUBINUR Negative 07/08/2014 0956   KETONESUR NEGATIVE 09/21/2018 1824   PROTEINUR 30 (A) 09/21/2018 1824   NITRITE NEGATIVE 09/21/2018 1824   LEUKOCYTESUR TRACE (A) 09/21/2018 1824   LEUKOCYTESUR 1+ 07/08/2014 0956      RADIOLOGY: Dg Chest 2 View  Result Date: 09/21/2018 CLINICAL DATA:  Pain and dyspnea with cough. EXAM: CHEST - 2 VIEW COMPARISON:  08/30/2018 FINDINGS: Stable cardiomegaly with tortuous atherosclerotic aorta. Mitral annular calcifications are redemonstrated. Lungs are clear without acute pulmonary consolidation, effusion or pneumothorax. Chronic elevation the right hemidiaphragm. Mild dextroconvex curvature of the midthoracic spine. 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 IMPRESSION: Stable cardiomegaly with tortuous atherosclerotic aorta. No acute pulmonary abnormality. Electronically Signed   By: Ashley Royalty M.D.   On: 09/21/2018 18:23   Ct Angio Chest Pe W And/or Wo Contrast  Result Date: 09/21/2018 CLINICAL DATA:  Evaluate for pulmonary embolism. EXAM: CT ANGIOGRAPHY CHEST WITH CONTRAST TECHNIQUE: Multidetector CT imaging of the chest was performed using the standard protocol during bolus administration of intravenous contrast. Multiplanar CT image reconstructions and MIPs were obtained to evaluate the vascular anatomy. CONTRAST:  53mL OMNIPAQUE IOHEXOL 350 MG/ML SOLN COMPARISON:  Chest CT - 05/18/2018; 09/03/2015 FINDINGS: Vascular Findings: There is adequate opacification of the pulmonary arterial system with the main pulmonary artery measuring 291 Hounsfield units. There are no discrete filling defects within the pulmonary arterial tree to suggest pulmonary embolism. Re demonstrated marked enlargement of the caliber of the main  pulmonary artery measuring 45 mm in diameter. Cardiomegaly. No pericardial effusion. Exuberant calcifications of the mitral valve annulus. Grossly unchanged mild fusiform aneurysmal dilatation of the ascending thoracic aorta measuring approximately 41 mm in diameter. Conventional configuration of the aortic arch. The branch vessels of the aortic arch appear widely patent throughout their imaged course. Review of the MIP images confirms  the above findings. ---------------------------------------------------------------------------------- Nonvascular Findings: Mediastinum/Lymph Nodes: No bulky mediastinal, hilar or axillary lymphadenopathy. Lungs/Pleura: Evaluation the pulmonary parenchyma is minimally degraded secondary to patient respiratory artifact. There is chronic partial atelectasis/collapse of the right lower lobe. No discrete focal airspace opacities. No pleural effusion or pneumothorax. The central pulmonary airways appear widely patent. No discrete pulmonary nodules. Upper abdomen: Limited early arterial phase evaluation the upper abdomen demonstrates a partially exophytic hypoattenuating at least 4.6 cm hypoattenuating cyst arising from the superior pole of the right kidney, incompletely imaged. Note is made of a small splenule. Musculoskeletal: No acute or aggressive osseous abnormalities. Scoliotic curvature of the thoracolumbar spine stigmata of DISH with associated ankylosis throughout the thoracic spine. Severe degenerative change of the bilateral glenohumeral joints with joint space loss, subchondral sclerosis and osteophytosis. Note is made of a partially calcified approximately 2.7 x 1.9 cm nodule arising from the inferior aspect of the right lobe of the thyroid (axial image 20, series 4; coronal image 29, series 7), similar to the 08/2015 examination. IMPRESSION: 1. No acute cardiopulmonary disease. Specifically, no evidence of pulmonary embolism or focal airspace opacity to suggest pneumonia. 2. Similar findings of cardiomegaly enlargement of the main pulmonary artery as could be seen in the setting of pulmonary arterial hypertension. 3. Chronic partial atelectasis/collapse of the right lower lobe. 4. Mild fusiform aneurysmal dilatation of the ascending thoracic aorta measuring 41 mm in diameter, similar to at least the 08/2015 examination. Aortic aneurysm NOS (ICD10-I71.9). 5.  Aortic Atherosclerosis (ICD10-I70.0).  Electronically Signed   By: Sandi Mariscal M.D.   On: 09/21/2018 19:49    EKG: Orders placed or performed during the hospital encounter of 09/21/18  . EKG 12-Lead  . EKG 12-Lead    IMPRESSION AND PLAN:  1.  Acute on chronic respiratory failure with hypoxia, secondary to acute COPD exacerbation.  We will treat with IV steroids, oxygen and nebulizer. 2.  Acute COPD exacerbation, see treatment as above under #1. 3.  Acute UTI, will treat with IV antibiotic, ceftriaxone, while waiting for urine culture result. 4.  CHF, currently clinically compensated.  Will continue maintenance therapy. 5.  History of PE and DVT.  Continue chronic anticoagulation with Eliquis.  All the records are reviewed and case discussed with ED provider. Management plans discussed with the patient, family and they are in agreement.  CODE STATUS: Full code Code Status History    Date Active Date Inactive Code Status Order ID Comments User Context   09/09/2018 1240 09/12/2018 2008 Full Code [id number]  Loletha Grayer, MD ED   08/30/2018 2018 09/01/2018 2004 DNR [id number]  Nicholes Mango, MD ED   08/30/2018 1903 08/30/2018 2018 Full Code [id number]  Nicholes Mango, MD ED   08/13/2018 1038 08/15/2018 1836 Full Code [id number]  Loletha Grayer, MD Inpatient   08/12/2018 2001 08/13/2018 1038 DNR [id number]  Gorden Harms, MD ED   08/12/2018 2001 08/12/2018 2001 Full Code [id number]  Salary, Avel Peace, MD ED   06/15/2018 1635 06/18/2018 2009 Full Code [id number]  [name], MD Inpatient   02/24/2018 0347 02/27/2018 0027 Full Code [id number]  Amelia Jo, MD Inpatient   10/15/2017  1141 10/16/2017 2204 Full Code [id number]  Knox Royalty, NP Inpatient   10/13/2017 2246 10/15/2017 1141 DNR [id number]  Lance Coon, MD ED   10/06/2017 1454 10/09/2017 2058 DNR [id number]  Nicholes Mango, MD Inpatient   10/06/2017 1111 10/06/2017 1453 Full Code [id number]  Nicholes Mango, MD Inpatient   05/27/2017 1954 05/28/2017 2013 Full Code [id number]   Vaughan Basta, MD Inpatient   05/23/2017 2322 05/26/2017 1614 Full Code [id number]  Lance Coon, MD Inpatient   04/10/2017 0325 04/11/2017 1916 Full Code [id number]  Harrie Foreman, MD Inpatient   11/23/2016 0629 12/01/2016 1935 Full Code [id number]  Harrie Foreman, MD Inpatient   08/26/2016 0445 08/26/2016 1923 Full Code [id number]  Saundra Shelling, MD Inpatient   09/22/2015 1755 09/25/2015 1844 Full Code [id number]  Demetrios Loll, MD ED   09/15/2015 0341 09/17/2015 1741 Full Code [id number]  Harrie Foreman, MD Inpatient   07/16/2015 0151 07/20/2015 2334 Full Code [id number]  Lance Coon, MD Inpatient   05/25/2015 0834 05/29/2015 1616 Full Code [id number]  Harrie Foreman, MD Inpatient   04/02/2015 0606 04/08/2015 1651 Full Code [id number]  Harrie Foreman, MD Inpatient    Advance Directive Documentation     Most Recent Value  Type of Advance Directive  Healthcare Power of Attorney, Living will  Pre-existing out of facility DNR order (yellow form or pink MOST form)  -  "MOST" Form in Place?  -       TOTAL TIME TAKING CARE OF THIS PATIENT:50 minutes.    Amelia Jo M.D on 09/22/2018 at 12:56 AM  Between 7am to 6pm - Pager - [phone]  After 6pm go to [email or web address] - password [email or web address] [facility] [place] [facility]  [phone]  CC: Primary care physician; [name], MD

## 2018-09-23 LAB — GLUCOSE, CAPILLARY: GLUCOSE-CAPILLARY: 86 mg/dL (ref 70–99)

## 2018-09-23 LAB — URINE CULTURE: Culture: 40000 — AB

## 2018-09-23 MED ORDER — QUETIAPINE FUMARATE 25 MG PO TABS
25.0000 mg | ORAL_TABLET | Freq: Every day | ORAL | Status: DC
  Administered 2018-09-23 – 2018-09-24 (×2): 25 mg via ORAL
  Filled 2018-09-23 (×2): qty 1

## 2018-09-23 MED ORDER — HALOPERIDOL LACTATE 5 MG/ML IJ SOLN
1.0000 mg | Freq: Four times a day (QID) | INTRAMUSCULAR | Status: DC | PRN
  Administered 2018-09-23: 1 mg via INTRAVENOUS
  Filled 2018-09-23: qty 1

## 2018-09-23 NOTE — Care Management (Signed)
Barrier to discharge-continues on IV antibiotics.  Acute delirium receiving Haldol 1mg  IV every 6 hours as needed.  Trial Seroquel at bedtime.
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
## 2018-09-23 NOTE — Progress Notes (Signed)
Patient ID: Kashira Behunin, female   DOB: 1932-02-08, 84 y.o.   MRN: [id number]  [facility] PROGRESS NOTE  [facility] ZJQ:[id number] DOB: 07-Feb-1932 DOA: 09/21/2018 PCP: [name], MD  HPI/Subjective: Called to see the patient because she was very agitated.  When I got there she was complaining that she was kidnapped and that we have her in jail.  She states she is get a bust right out of here.  Patient was very hard to focus today.  Objective: Vitals:   09/23/18 0726 09/23/18 0915  BP: (!) 158/76 (!) 144/69  Pulse: 74 69  Resp: 19   Temp: 97.7 F (36.5 C)   SpO2: 97%     Filed Weights   09/21/18 1659 09/22/18 0134 09/23/18 0401  Weight: 91.6 kg 90.9 kg 91 kg    ROS: Review of Systems  Unable to perform ROS: Acuity of condition  Respiratory: Positive for shortness of breath.   Cardiovascular: Negative for chest pain.   Exam: Physical Exam  HENT:  Nose: No mucosal edema.  Mouth/Throat: No oropharyngeal exudate or posterior oropharyngeal edema.  Eyes: Pupils are equal, round, and reactive to light. Conjunctivae, EOM and lids are normal.  Neck: No JVD present. Carotid bruit is not present. No edema present. No thyroid mass and no thyromegaly present.  Cardiovascular: S1 normal and S2 normal. Exam reveals no gallop.  No murmur heard. Pulses:      Dorsalis pedis pulses are 2+ on the right side, and 2+ on the left side.  Respiratory: No respiratory distress. She has decreased breath sounds in the right lower field and the left lower field. She has no wheezes. She has no rhonchi. She has no rales.  GI: Soft. Bowel sounds are normal. There is no tenderness.  Musculoskeletal:       Right ankle: She exhibits swelling.       Left ankle: She exhibits swelling.  Previous right toe amputations  Lymphadenopathy:    She has no cervical adenopathy.  Neurological: She is alert.  Baseline left-sided weakness from stroke.  Skin: Skin is warm. No rash noted. Nails show no  clubbing.  Psychiatric: Her affect is angry. She is agitated.      Data Reviewed: Basic Metabolic Panel: Recent Labs  Lab 09/21/18 1705 09/22/18 0352  NA 145 144  K 4.1 4.3  CL 106 107  CO2 31 28  GLUCOSE 109* 124*  BUN 14 12  CREATININE 0.64 0.37*  CALCIUM 9.0 8.4*   Liver Function Tests: Recent Labs  Lab 09/21/18 1705  AST 19  ALT 10  ALKPHOS 71  BILITOT 0.7  PROT 7.4  ALBUMIN 3.2*   CBC: Recent Labs  Lab 09/21/18 1705 09/22/18 0352  WBC 15.0* 12.9*  HGB 9.6* 9.2*  HCT 33.5* 32.6*  MCV 86.3 88.3  PLT 338 243   Cardiac Enzymes: Recent Labs  Lab 09/21/18 1705  TROPONINI 0.03*   BNP (last 3 results) Recent Labs    05/17/18 2036 06/21/18 0500 09/21/18 1705  BNP 74.0 30.0 72.0     CBG: Recent Labs  Lab 09/22/18 0749 09/23/18 0728  GLUCAP 116* 86    Recent Results (from the past 240 hour(s))  Blood culture (routine x 2)     Status: None (Preliminary result)   Collection Time: 09/21/18  6:23 PM  Result Value Ref Range Status   Specimen Description   Final    BLOOD RIGHT ANTECUBITAL Performed at [facility], [address] [place], [place] [address]  Special Requests   Final    BOTTLES DRAWN AEROBIC AND ANAEROBIC Blood Culture adequate volume Performed at [facility], [address] [place], [place] [address]    Culture  Setup Time   Final    GRAM NEGATIVE RODS AEROBIC BOTTLE ONLY CRITICAL VALUE NOTED.  VALUE IS CONSISTENT WITH PREVIOUSLY REPORTED AND CALLED VALUE. Performed at [facility], [address], [place], [place] [address]    Culture   Final    Lonell Grandchild NEGATIVE RODS IDENTIFICATION TO FOLLOW Performed at [facility], [place] [address] [place], [place] [address]    Report Status PENDING  Incomplete  Blood culture (routine x 2)     Status: Abnormal (Preliminary result)   Collection Time: 09/21/18  6:23 PM  Result Value Ref Range Status   Specimen Description   Final    BLOOD LEFT  ANTECUBITAL Performed at [facility], [address], [place], [place] [address]    Special Requests   Final    BOTTLES DRAWN AEROBIC AND ANAEROBIC Blood Culture adequate volume Performed at [facility], [address] [place], [place] [address]    Culture  Setup Time   Final    GRAM NEGATIVE RODS AEROBIC BOTTLE ONLY CRITICAL RESULT CALLED TO, READ BACK BY AND VERIFIED WITH: KAREN HAYES 09/22/18 1253 KLW    Culture (A)  Final    ENTEROBACTER CLOACAE SUSCEPTIBILITIES TO FOLLOW Performed at [facility], [place] [address] [place], [place] [address]    Report Status PENDING  Incomplete  Blood Culture ID Panel (Reflexed)     Status: Abnormal   Collection Time: 09/21/18  6:23 PM  Result Value Ref Range Status   Enterococcus species NOT DETECTED NOT DETECTED Final   Listeria monocytogenes NOT DETECTED NOT DETECTED Final   Staphylococcus species NOT DETECTED NOT DETECTED Final   Staphylococcus aureus (BCID) NOT DETECTED NOT DETECTED Final   Streptococcus species NOT DETECTED NOT DETECTED Final   Streptococcus agalactiae NOT DETECTED NOT DETECTED Final   Streptococcus pneumoniae NOT DETECTED NOT DETECTED Final   Streptococcus pyogenes NOT DETECTED NOT DETECTED Final   Acinetobacter baumannii NOT DETECTED NOT DETECTED Final   Enterobacteriaceae species DETECTED (A) NOT DETECTED Final    Comment: Enterobacteriaceae represent a large family of gram-negative bacteria, not a single organism. CRITICAL RESULT CALLED TO, READ BACK BY AND VERIFIED WITH: KAREN HAYES 09/22/18 1253 KLW    Enterobacter cloacae complex DETECTED (A) NOT DETECTED Final    Comment: CRITICAL RESULT CALLED TO, READ BACK BY AND VERIFIED WITH: KAREN HAYES 09/22/18 1253 KLW    Escherichia coli NOT DETECTED NOT DETECTED Final   Klebsiella oxytoca NOT DETECTED NOT DETECTED Final   Klebsiella pneumoniae NOT DETECTED NOT DETECTED Final   Proteus species NOT DETECTED NOT DETECTED Final   Serratia  marcescens NOT DETECTED NOT DETECTED Final   Carbapenem resistance NOT DETECTED NOT DETECTED Final   Haemophilus influenzae NOT DETECTED NOT DETECTED Final   Neisseria meningitidis NOT DETECTED NOT DETECTED Final   Pseudomonas aeruginosa NOT DETECTED NOT DETECTED Final   Candida albicans NOT DETECTED NOT DETECTED Final   Candida glabrata NOT DETECTED NOT DETECTED Final   Candida krusei NOT DETECTED NOT DETECTED Final   Candida parapsilosis NOT DETECTED NOT DETECTED Final   Candida tropicalis NOT DETECTED NOT DETECTED Final    Comment: Performed at [facility], [address], [place], [place] [address]  Urine culture     Status: Abnormal   Collection  Time: 09/21/18  6:24 PM  Result Value Ref Range Status   Specimen Description   Final    URINE, CLEAN CATCH Performed at [facility], [address] [place], [place] [address]    Special Requests   Final    NONE Performed at [facility], [place], [place] [address]    Culture 40,000 COLONIES/mL YEAST (A)  Final   Report Status 09/23/2018 FINAL  Final     Studies: Dg Chest 2 View  Result Date: 09/21/2018 CLINICAL DATA:  Pain and dyspnea with cough. EXAM: CHEST - 2 VIEW COMPARISON:  08/30/2018 FINDINGS: Stable cardiomegaly with tortuous atherosclerotic aorta. Mitral annular calcifications are redemonstrated. Lungs are clear without acute pulmonary consolidation, effusion or pneumothorax. Chronic elevation the right hemidiaphragm. Mild dextroconvex curvature of the midthoracic spine. 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 IMPRESSION: Stable cardiomegaly with tortuous atherosclerotic aorta. No acute pulmonary abnormality. Electronically Signed   By: Ashley Royalty M.D.   On: 09/21/2018 18:23   Ct Angio Chest Pe W And/or Wo Contrast  Result Date: 09/21/2018 CLINICAL DATA:  Evaluate for pulmonary embolism. EXAM: CT ANGIOGRAPHY CHEST WITH CONTRAST TECHNIQUE:  Multidetector CT imaging of the chest was performed using the standard protocol during bolus administration of intravenous contrast. Multiplanar CT image reconstructions and MIPs were obtained to evaluate the vascular anatomy. CONTRAST:  1mL OMNIPAQUE IOHEXOL 350 MG/ML SOLN COMPARISON:  Chest CT - 05/18/2018; 09/03/2015 FINDINGS: Vascular Findings: There is adequate opacification of the pulmonary arterial system with the main pulmonary artery measuring 291 Hounsfield units. There are no discrete filling defects within the pulmonary arterial tree to suggest pulmonary embolism. Re demonstrated marked enlargement of the caliber of the main pulmonary artery measuring 45 mm in diameter. Cardiomegaly. No pericardial effusion. Exuberant calcifications of the mitral valve annulus. Grossly unchanged mild fusiform aneurysmal dilatation of the ascending thoracic aorta measuring approximately 41 mm in diameter. Conventional configuration of the aortic arch. The branch vessels of the aortic arch appear widely patent throughout their imaged course. Review of the MIP images confirms the above findings. ---------------------------------------------------------------------------------- Nonvascular Findings: Mediastinum/Lymph Nodes: No bulky mediastinal, hilar or axillary lymphadenopathy. Lungs/Pleura: Evaluation the pulmonary parenchyma is minimally degraded secondary to patient respiratory artifact. There is chronic partial atelectasis/collapse of the right lower lobe. No discrete focal airspace opacities. No pleural effusion or pneumothorax. The central pulmonary airways appear widely patent. No discrete pulmonary nodules. Upper abdomen: Limited early arterial phase evaluation the upper abdomen demonstrates a partially exophytic hypoattenuating at least 4.6 cm hypoattenuating cyst arising from the superior pole of the right kidney, incompletely imaged. Note is made of a small splenule. Musculoskeletal: No acute or aggressive  osseous abnormalities. Scoliotic curvature of the thoracolumbar spine stigmata of DISH with associated ankylosis throughout the thoracic spine. Severe degenerative change of the bilateral glenohumeral joints with joint space loss, subchondral sclerosis and osteophytosis. Note is made of a partially calcified approximately 2.7 x 1.9 cm nodule arising from the inferior aspect of the right lobe of the thyroid (axial image 20, series 4; coronal image 29, series 7), similar to the 08/2015 examination. IMPRESSION: 1. No acute cardiopulmonary disease. Specifically, no evidence of pulmonary embolism or focal airspace opacity to suggest pneumonia. 2. Similar findings of cardiomegaly enlargement of the main pulmonary artery as could be seen in the setting of pulmonary arterial hypertension. 3. Chronic partial atelectasis/collapse of the right lower lobe. 4. Mild fusiform aneurysmal dilatation of the ascending thoracic aorta measuring 41 mm in diameter,  similar to at least the 08/2015 examination. Aortic aneurysm NOS (ICD10-I71.9). 5.  Aortic Atherosclerosis (ICD10-I70.0). Electronically Signed   By: Sandi Mariscal M.D.   On: 09/21/2018 19:49    Scheduled Meds: . amLODipine  5 mg Oral Daily  . apixaban  5 mg Oral BID  . cholecalciferol  1,000 Units Oral Daily  . docusate sodium  100 mg Oral BID  . estradiol  1 Applicatorful Vaginal Once per day on Mon Wed Fri  . ferrous sulfate  325 mg Oral Daily  . folic acid  0.5 mg Oral Daily  . methylPREDNISolone (SOLU-MEDROL) injection  40 mg Intravenous Daily  . metoprolol tartrate  12.5 mg Oral BID  . pantoprazole  40 mg Oral Daily  . QUEtiapine  25 mg Oral QHS  . ruxolitinib phosphate  15 mg Oral BID   Continuous Infusions: . ceFEPime (MAXIPIME) IV 1 g (09/23/18 1108)    Assessment/Plan:  1. Acute delirium.  Haldol 1 mg IV every 6 hours as needed agitation.  Trial of Seroquel at night to sleep.  Discontinue Solu-Medrol.   2. Sepsis with gram-negative rods in the  blood cultures.  So far growing Enterobacter.  On Maxipime.  Follow-up cultures and sensitivities. 3. COPD exacerbation.  Discontinue Solu-Medrol.  Continue nebulizer treatments. 4. Essential hypertension on Norvasc 5. Polycythemia vera on Barnabas Lister if he 6. Previous stroke and chronic left-sided weakness 7. History of DVT and PE.  Recent lower GI bleed.  Monitor hemoglobin.  Patient currently on Eliquis.  Aspirin held.  Code Status:     Code Status Orders  (From admission, onward)         Start     Ordered   09/22/18 0134  Full code  Continuous     09/22/18 0133        Code Status History    Date Active Date Inactive Code Status Order ID Comments User Context   09/09/2018 1240 09/12/2018 2008 Full Code [id number]  Loletha Grayer, MD ED   08/30/2018 2018 09/01/2018 2004 DNR [id number]  Nicholes Mango, MD ED   08/30/2018 1903 08/30/2018 2018 Full Code [id number]  Nicholes Mango, MD ED   08/13/2018 1038 08/15/2018 1836 Full Code [id number]  Loletha Grayer, MD Inpatient   08/12/2018 2001 08/13/2018 1038 DNR [id number]  Salary, Avel Peace, MD ED   08/12/2018 2001 08/12/2018 2001 Full Code [id number]  Salary, Avel Peace, MD ED   06/15/2018 1635 06/18/2018 2009 Full Code [id number]  [name], MD Inpatient   02/24/2018 0347 02/27/2018 0027 Full Code [id number]  Amelia Jo, MD Inpatient   10/15/2017 1141 10/16/2017 2204 Full Code [id number]  Knox Royalty, NP Inpatient   10/13/2017 2246 10/15/2017 1141 DNR [id number]  Lance Coon, MD ED   10/06/2017 1454 10/09/2017 2058 DNR [id number]  Nicholes Mango, MD Inpatient   10/06/2017 1111 10/06/2017 1453 Full Code [id number]  Nicholes Mango, MD Inpatient   05/27/2017 1954 05/28/2017 2013 Full Code [id number]  Vaughan Basta, MD Inpatient   05/23/2017 2322 05/26/2017 1614 Full Code [id number]  Lance Coon, MD Inpatient   04/10/2017 0325 04/11/2017 1916 Full Code [id number]  Harrie Foreman, MD Inpatient   11/23/2016 0629 12/01/2016 1935 Full Code [id number]  Harrie Foreman, MD Inpatient   08/26/2016 0445 08/26/2016 1923 Full Code [id number]  Saundra Shelling, MD Inpatient   09/22/2015 1755 09/25/2015 1844 Full Code [id number]  Demetrios Loll, MD ED   09/15/2015 0341 09/17/2015 1741 Full Code [id number]  Harrie Foreman, MD Inpatient   07/16/2015  0151 07/20/2015 2334 Full Code [id number]  Lance Coon, MD Inpatient   05/25/2015 0834 05/29/2015 1616 Full Code [id number]  Harrie Foreman, MD Inpatient   04/02/2015 0606 04/08/2015 1651 Full Code [id number]  Harrie Foreman, MD Inpatient    Advance Directive Documentation     Most Recent Value  Type of Advance Directive  Healthcare Power of Attorney, Living will  Pre-existing out of facility DNR order (yellow form or pink MOST form)  -  "MOST" Form in Place?  -     Family Communication: Spoke with granddaughter on the phone Disposition Plan: Delirium will need to clear and need to know what is growing in the blood cultures and sensitivities.  Antibiotics:  Cefepime  Time spent: 28 minutes  [place]

## 2018-09-24 LAB — CULTURE, BLOOD (ROUTINE X 2): SPECIAL REQUESTS: ADEQUATE

## 2018-09-24 LAB — GLUCOSE, CAPILLARY: Glucose-Capillary: 75 mg/dL (ref 70–99)

## 2018-09-24 MED ORDER — SODIUM CHLORIDE 0.9% FLUSH
3.0000 mL | Freq: Two times a day (BID) | INTRAVENOUS | Status: DC
  Administered 2018-09-24 (×2): 3 mL via INTRAVENOUS

## 2018-09-24 MED ORDER — SODIUM CHLORIDE 0.9 % IV SOLN
INTRAVENOUS | Status: DC | PRN
  Administered 2018-09-24: 500 mL via INTRAVENOUS
  Administered 2018-09-25: 250 mL via INTRAVENOUS

## 2018-09-24 MED ORDER — BUDESONIDE 0.5 MG/2ML IN SUSP
0.5000 mg | Freq: Two times a day (BID) | RESPIRATORY_TRACT | Status: DC
  Administered 2018-09-24 – 2018-09-25 (×2): 0.5 mg via RESPIRATORY_TRACT
  Filled 2018-09-24 (×2): qty 2

## 2018-09-24 MED ORDER — IPRATROPIUM-ALBUTEROL 0.5-2.5 (3) MG/3ML IN SOLN
3.0000 mL | Freq: Four times a day (QID) | RESPIRATORY_TRACT | Status: DC
  Administered 2018-09-24 – 2018-09-25 (×4): 3 mL via RESPIRATORY_TRACT
  Filled 2018-09-24 (×4): qty 3

## 2018-09-24 NOTE — Progress Notes (Signed)
Patient transported to room 151. All belongings at bedside with patient. Report given to [place], Therapist, sports. Granddaughter, [name], notified of transfer. All questions and concerns addressed. Patient stable at time of transfer.

## 2018-09-24 NOTE — Care Management Important Message (Signed)
Needs initial signed.  Patient asleep.  Left copy in room for review.

## 2018-09-24 NOTE — Progress Notes (Signed)
Patient ID: Yolanda Wells, female   DOB: 11/05/1932, 84 y.o.   MRN: [id number]  [facility] PROGRESS NOTE  Dabney Dever TKZ:[id number] DOB: 09/04/1932 DOA: 09/21/2018 PCP: [name], MD  HPI/Subjective: Patient better with regards to her mental status today.  Able to answer more questions.  Objective: Vitals:   09/24/18 1503 09/24/18 1506  BP: (!) 142/72   Pulse: 64   Resp: (!) 22   Temp: 98.2 F (36.8 C)   SpO2: 100% 100%    Filed Weights   09/22/18 0134 09/23/18 0401 09/24/18 0550  Weight: 90.9 kg 91 kg 93 kg    ROS: Review of Systems  Unable to perform ROS: Acuity of condition  Respiratory: Positive for shortness of breath. Negative for cough.   Cardiovascular: Negative for chest pain.  Gastrointestinal: Negative for abdominal pain, nausea and vomiting.  Musculoskeletal: Negative for back pain.   Exam: Physical Exam  HENT:  Nose: No mucosal edema.  Mouth/Throat: No oropharyngeal exudate or posterior oropharyngeal edema.  Eyes: Pupils are equal, round, and reactive to light. Conjunctivae, EOM and lids are normal.  Neck: No JVD present. Carotid bruit is not present. No edema present. No thyroid mass and no thyromegaly present.  Cardiovascular: S1 normal and S2 normal. Exam reveals no gallop.  No murmur heard. Pulses:      Dorsalis pedis pulses are 2+ on the right side, and 2+ on the left side.  Respiratory: No respiratory distress. She has decreased breath sounds in the right lower field and the left lower field. She has no wheezes. She has no rhonchi. She has no rales.  GI: Soft. Bowel sounds are normal. There is no tenderness.  Musculoskeletal:       Right ankle: She exhibits swelling.       Left ankle: She exhibits swelling.  Previous right toe amputations  Lymphadenopathy:    She has no cervical adenopathy.  Neurological: She is alert.  Baseline left-sided weakness from stroke.  Skin: Skin is warm. No rash noted. Nails show no clubbing.   Psychiatric: Her affect is angry. She is agitated.      Data Reviewed: Basic Metabolic Panel: Recent Labs  Lab 09/21/18 1705 09/22/18 0352  NA 145 144  K 4.1 4.3  CL 106 107  CO2 31 28  GLUCOSE 109* 124*  BUN 14 12  CREATININE 0.64 0.37*  CALCIUM 9.0 8.4*   Liver Function Tests: Recent Labs  Lab 09/21/18 1705  AST 19  ALT 10  ALKPHOS 71  BILITOT 0.7  PROT 7.4  ALBUMIN 3.2*   CBC: Recent Labs  Lab 09/21/18 1705 09/22/18 0352  WBC 15.0* 12.9*  HGB 9.6* 9.2*  HCT 33.5* 32.6*  MCV 86.3 88.3  PLT 338 243   Cardiac Enzymes: Recent Labs  Lab 09/21/18 1705  TROPONINI 0.03*   BNP (last 3 results) Recent Labs    05/17/18 2036 06/21/18 0500 09/21/18 1705  BNP 74.0 30.0 72.0     CBG: Recent Labs  Lab 09/22/18 0749 09/23/18 0728 09/24/18 0802  GLUCAP 116* 86 75    Recent Results (from the past 240 hour(s))  Blood culture (routine x 2)     Status: Abnormal   Collection Time: 09/21/18  6:23 PM  Result Value Ref Range Status   Specimen Description   Final    BLOOD RIGHT ANTECUBITAL Performed at [facility], [address] [place], [place] [address]    Special Requests   Final    BOTTLES DRAWN AEROBIC AND  ANAEROBIC Blood Culture adequate volume Performed at [facility], [address] [place], [place] [address]    Culture  Setup Time   Final    GRAM NEGATIVE RODS AEROBIC BOTTLE ONLY CRITICAL VALUE NOTED.  VALUE IS CONSISTENT WITH PREVIOUSLY REPORTED AND CALLED VALUE. Performed at [facility], [address] [place], [place] [address]    Culture (A)  Final    ENTEROBACTER CLOACAE SUSCEPTIBILITIES PERFORMED ON PREVIOUS CULTURE WITHIN THE LAST 5 DAYS. Performed at [facility], [place] [address] [place], [place] [address]    Report Status 09/24/2018 FINAL  Final  Blood culture (routine x 2)     Status: Abnormal (Preliminary result)   Collection Time: 09/21/18  6:23 PM  Result Value Ref Range Status    Specimen Description   Final    BLOOD LEFT ANTECUBITAL Performed at [facility], [address] [place], [place] [address]    Special Requests   Final    BOTTLES DRAWN AEROBIC AND ANAEROBIC Blood Culture adequate volume Performed at [facility], [address] [place], [place] [address]    Culture  Setup Time   Final    GRAM NEGATIVE RODS AEROBIC BOTTLE ONLY CRITICAL RESULT CALLED TO, READ BACK BY AND VERIFIED WITH: KAREN HAYES 09/22/18 1253 KLW Performed at [facility], [place] [address] [place], [place] [address]    Culture ENTEROBACTER SPECIES (A)  Final   Report Status PENDING  Incomplete   Organism ID, Bacteria ENTEROBACTER SPECIES  Final      Susceptibility   Enterobacter species - MIC*    CEFAZOLIN >=64 RESISTANT Resistant     CEFEPIME <=1 SENSITIVE Sensitive     CEFTAZIDIME <=1 SENSITIVE Sensitive     CEFTRIAXONE <=1 SENSITIVE Sensitive     CIPROFLOXACIN <=0.25 SENSITIVE Sensitive     GENTAMICIN <=1 SENSITIVE Sensitive     IMIPENEM <=0.25 SENSITIVE Sensitive     TRIMETH/SULFA <=20 SENSITIVE Sensitive     PIP/TAZO 16 SENSITIVE Sensitive     * ENTEROBACTER SPECIES  Blood Culture ID Panel (Reflexed)     Status: Abnormal   Collection Time: 09/21/18  6:23 PM  Result Value Ref Range Status   Enterococcus species NOT DETECTED NOT DETECTED Final   Listeria monocytogenes NOT DETECTED NOT DETECTED Final   Staphylococcus species NOT DETECTED NOT DETECTED Final   Staphylococcus aureus (BCID) NOT DETECTED NOT DETECTED Final   Streptococcus species NOT DETECTED NOT DETECTED Final   Streptococcus agalactiae NOT DETECTED NOT DETECTED Final   Streptococcus pneumoniae NOT DETECTED NOT DETECTED Final   Streptococcus pyogenes NOT DETECTED NOT DETECTED Final   Acinetobacter baumannii NOT DETECTED NOT DETECTED Final   Enterobacteriaceae species DETECTED (A) NOT DETECTED Final    Comment: Enterobacteriaceae represent a large family of gram-negative bacteria,  not a single organism. CRITICAL RESULT CALLED TO, READ BACK BY AND VERIFIED WITH: KAREN HAYES 09/22/18 1253 KLW    Enterobacter cloacae complex DETECTED (A) NOT DETECTED Final    Comment: CRITICAL RESULT CALLED TO, READ BACK BY AND VERIFIED WITH: KAREN HAYES 09/22/18 1253 KLW    Escherichia coli NOT DETECTED NOT DETECTED Final   Klebsiella oxytoca NOT DETECTED NOT DETECTED Final   Klebsiella pneumoniae NOT DETECTED NOT DETECTED Final   Proteus species NOT DETECTED NOT DETECTED Final   Serratia marcescens NOT DETECTED NOT DETECTED Final   Carbapenem resistance NOT DETECTED NOT DETECTED Final   Haemophilus influenzae NOT DETECTED NOT DETECTED Final   Neisseria meningitidis  NOT DETECTED NOT DETECTED Final   Pseudomonas aeruginosa NOT DETECTED NOT DETECTED Final   Candida albicans NOT DETECTED NOT DETECTED Final   Candida glabrata NOT DETECTED NOT DETECTED Final   Candida krusei NOT DETECTED NOT DETECTED Final   Candida parapsilosis NOT DETECTED NOT DETECTED Final   Candida tropicalis NOT DETECTED NOT DETECTED Final    Comment: Performed at [facility], [address] [place], [place] [address]  Urine culture     Status: Abnormal   Collection Time: 09/21/18  6:24 PM  Result Value Ref Range Status   Specimen Description   Final    URINE, CLEAN CATCH Performed at [facility], [address] [place], [place] [address]    Special Requests   Final    NONE Performed at [facility], [place], [place] [address]    Culture 40,000 COLONIES/mL YEAST (A)  Final   Report Status 09/23/2018 FINAL  Final     Studies: No results found.  Scheduled Meds: . amLODipine  5 mg Oral Daily  . apixaban  5 mg Oral BID  . budesonide (PULMICORT) nebulizer solution  0.5 mg Nebulization BID  . cholecalciferol  1,000 Units Oral Daily  . docusate sodium  100 mg Oral BID  . estradiol  1 Applicatorful Vaginal Once per day on Mon Wed Fri  . ferrous sulfate   325 mg Oral Daily  . folic acid  0.5 mg Oral Daily  . ipratropium-albuterol  3 mL Nebulization Q6H  . metoprolol tartrate  12.5 mg Oral BID  . pantoprazole  40 mg Oral Daily  . QUEtiapine  25 mg Oral QHS  . ruxolitinib phosphate  15 mg Oral BID  . sodium chloride flush  3 mL Intravenous Q12H   Continuous Infusions: . ceFEPime (MAXIPIME) IV 1 g (09/24/18 1038)    Assessment/Plan:  1. Acute delirium.  This seems better today with Haldol and Seroquel.  Patient answering questions today.  I think stopping the steroids was most helpful. 2. Sepsis with gram-negative rods in the blood cultures. Enterobacter.  On Maxipime.  In speaking with the infectious disease pharmacist, they recommend changing antibiotics to either Bactrim or Cipro upon discharge. 3. COPD exacerbation.  Discontinue Solu-Medrol.  Continue nebulizer treatments. 4. Essential hypertension on Norvasc 5. Polycythemia vera on [place].  Spoke with Dr. Mike Gip and she wants to keep the patient on this medication 6. Previous stroke and chronic left-sided weakness 7. History of DVT and PE.  Recent lower GI bleed.  Monitor hemoglobin.  Patient currently on Eliquis.  Aspirin held.  Code Status:     Code Status Orders  (From admission, onward)         Start     Ordered   09/22/18 0134  Full code  Continuous     09/22/18 0133        Code Status History    Date Active Date Inactive Code Status Order ID Comments User Context   09/09/2018 1240 09/12/2018 2008 Full Code [id number]  Loletha Grayer, MD ED   08/30/2018 2018 09/01/2018 2004 DNR [id number]  Nicholes Mango, MD ED   08/30/2018 1903 08/30/2018 2018 Full Code [id number]  Nicholes Mango, MD ED   08/13/2018 1038 08/15/2018 1836 Full Code [id number]  Loletha Grayer, MD Inpatient   08/12/2018 2001 08/13/2018 1038 DNR [id number]  Gorden Harms, MD ED   08/12/2018 2001 08/12/2018 2001 Full Code [id number]  Gorden Harms, MD ED   06/15/2018 1635 06/18/2018 2009  Full Code [id number]   [name], MD Inpatient   02/24/2018 0347 02/27/2018 0027 Full Code [id number]  Amelia Jo, MD Inpatient   10/15/2017 1141 10/16/2017 2204 Full Code [id number]  Knox Royalty, NP Inpatient   10/13/2017 2246 10/15/2017 1141 DNR [id number]  Lance Coon, MD ED   10/06/2017 1454 10/09/2017 2058 DNR [id number]  Nicholes Mango, MD Inpatient   10/06/2017 1111 10/06/2017 1453 Full Code [id number]  Nicholes Mango, MD Inpatient   05/27/2017 1954 05/28/2017 2013 Full Code [id number]  Vaughan Basta, MD Inpatient   05/23/2017 2322 05/26/2017 1614 Full Code [id number]  Lance Coon, MD Inpatient   04/10/2017 0325 04/11/2017 1916 Full Code [id number]  Harrie Foreman, MD Inpatient   11/23/2016 0629 12/01/2016 1935 Full Code [id number]  Harrie Foreman, MD Inpatient   08/26/2016 0445 08/26/2016 1923 Full Code [id number]  Saundra Shelling, MD Inpatient   09/22/2015 1755 09/25/2015 1844 Full Code [id number]  Demetrios Loll, MD ED   09/15/2015 0341 09/17/2015 1741 Full Code [id number]  Harrie Foreman, MD Inpatient   07/16/2015 0151 07/20/2015 2334 Full Code [id number]  Lance Coon, MD Inpatient   05/25/2015 0834 05/29/2015 1616 Full Code [id number]  Harrie Foreman, MD Inpatient   04/02/2015 0606 04/08/2015 1651 Full Code [id number]  Harrie Foreman, MD Inpatient    Advance Directive Documentation     Most Recent Value  Type of Advance Directive  Healthcare Power of Attorney, Living will  Pre-existing out of facility DNR order (yellow form or pink MOST form)  -  "MOST" Form in Place?  -     Family Communication: Spoke with granddaughter on the phone Disposition Plan: Reevaluate daily on when to go home  Antibiotics:  Cefepime  Time spent: 27 minutes  [place]

## 2018-09-25 LAB — CBC
HEMATOCRIT: 25.3 % — AB (ref 36.0–46.0)
HEMOGLOBIN: 7.4 g/dL — AB (ref 12.0–15.0)
MCH: 25.3 pg — ABNORMAL LOW (ref 26.0–34.0)
MCHC: 29.2 g/dL — ABNORMAL LOW (ref 30.0–36.0)
MCV: 86.6 fL (ref 80.0–100.0)
Platelets: 176 10*3/uL (ref 150–400)
RBC: 2.92 MIL/uL — AB (ref 3.87–5.11)
RDW: 28.9 % — AB (ref 11.5–15.5)
WBC: 11.3 10*3/uL — ABNORMAL HIGH (ref 4.0–10.5)
nRBC: 3.6 % — ABNORMAL HIGH (ref 0.0–0.2)

## 2018-09-25 LAB — CULTURE, BLOOD (ROUTINE X 2): Special Requests: ADEQUATE

## 2018-09-25 LAB — BLOOD GAS, VENOUS
ACID-BASE EXCESS: 6.5 mmol/L — AB (ref 0.0–2.0)
Bicarbonate: 34 mmol/L — ABNORMAL HIGH (ref 20.0–28.0)
O2 SAT: 53.4 %
PATIENT TEMPERATURE: 37
pCO2, Ven: 66 mmHg — ABNORMAL HIGH (ref 44.0–60.0)
pH, Ven: 7.32 (ref 7.250–7.430)

## 2018-09-25 LAB — GLUCOSE, CAPILLARY
GLUCOSE-CAPILLARY: 91 mg/dL (ref 70–99)
GLUCOSE-CAPILLARY: 79 mg/dL (ref 70–99)

## 2018-09-25 LAB — CREATININE, SERUM: Creatinine, Ser: 0.41 mg/dL — ABNORMAL LOW (ref 0.44–1.00)

## 2018-09-25 MED ORDER — CIPROFLOXACIN HCL 500 MG PO TABS: 500.0000 mg | ORAL_TABLET | Freq: Two times a day (BID) | ORAL | 0 refills | Status: DC

## 2018-09-25 MED ORDER — CIPROFLOXACIN HCL 500 MG PO TABS: 500.0000 mg | ORAL_TABLET | Freq: Two times a day (BID) | ORAL | 0 refills | Status: AC

## 2018-09-25 MED ORDER — IPRATROPIUM-ALBUTEROL 0.5-2.5 (3) MG/3ML IN SOLN: 3.0000 mL | Freq: Two times a day (BID) | RESPIRATORY_TRACT | Status: DC

## 2018-09-25 NOTE — Progress Notes (Signed)
DISCHARGE NOTE:  Discharge instructions given to pts granddaughter [name]. She verbalized understanding. EMS called for transportation.

## 2018-09-25 NOTE — Discharge Summary (Signed)
[facility] NAME: Yolanda Wells    MR#:  [id number]  DATE OF BIRTH:  1932/06/27  DATE OF ADMISSION:  09/21/2018 ADMITTING PHYSICIAN: Amelia Jo, MD  DATE OF DISCHARGE: 09/25/2018  PRIMARY CARE PHYSICIAN: [name], MD   ADMISSION DIAGNOSIS:  Acute cystitis Acute on chronic respiratory failure Acute COPD exacerbation  Polycythemia vera History of CVA History of DVT and pulmonary embolism DISCHARGE DIAGNOSIS:  Active Problems:   COPD exacerbation (HCC)   Sepsis ([place]) Enterobacter bacteremia COPD exacerbation Hypertension Polycythemia vera History of CVA History of DVT and pulmonary embolism Ascending thoracic aortic aneurysm  SECONDARY DIAGNOSIS:   Past Medical History:  Diagnosis Date  . Acute deep vein thrombosis (DVT) of distal vein of left lower extremity ([place]) 10/03/2015  . Acute pulmonary embolism ([place]) 10/03/2015  . Anticoagulant long-term use   . Arthritis   . Arthritis   . CHF (congestive heart failure) ([place])   . Chronic anticoagulation 10/03/2015  . Collagen vascular disease ([place])   . Colostomy in place [facility]) 10/04/2015  . Deep venous thrombosis (HCC)    right lower extremity  . Dependent edema   . Diverticulosis of intestine with bleeding 04/02/2015  . Glenohumeral arthritis 06/28/2012  . Hammertoe 09/02/2012  . History of bilateral hip replacements 09/02/2012  . History of hysterectomy   . Hypertension   . Hypertension   . Hypokalemia   . Inability to ambulate due to hip 10/12/2015  . Mandibular dysfunction   . Onychomycosis 09/02/2012  . Osteoarthritis   . Polycythemia vera ([place])   . Presence of IVC filter 05/25/2015  . Stroke ([place]) 03/26/2015   cerebellar  . Urinary incontinence in female 09/02/2016     ADMITTING HISTORY Yolanda Wells  is a 84 y.o. female with a known history of DVT, PE, on chronic anticoagulation with Eliquis, CHF, COPD, chronic respiratory failure on  continuous 2 L oxygen nasal cannula.  Patient is following with palliative care for severe, advanced COPD.  She is full code. Per family, patient has been short of breath and noted with productive cough with brown sputum in the past 24 hours.  Her oxygen saturation has been around 89% on 2 L oxygen per nasal cannula.  No reports of fever, chills, edema, nausea, vomiting, bleeding.Blood test done in emergency room was notable for elevated WBC at 15,000 and borderline elevated troponin level is 0.03.  UA is positive for UTI.CTA of the chest is negative for PE or infiltrates. Patient is admitted for further evaluation and treatment.  HOSPITAL COURSE:  Patient was admitted to telemetry.  Patient received IV Solu-Medrol and nebulization treatments during hospitalization.  Continue Eliquis for anticoagulation.  In view of history of recent GI bleed aspirin was stopped.  As patient was getting more delirious Solu-Medrol was also stopped during hospitalization.  Patient's blood cultures grew Enterobacter.  Patient continued [facility] for polycythemia vera.  Patient delirium improved after Solu-Medrol was stopped.  Patient received IV Maxipime antibiotic during the stay in the hospital.  Infectious disease pharmacist was involved in case was discussed and they recommended oral ciprofloxacin antibiotic on discharge.  COPD exacerbation improved.  Patient will be discharged home with home health services and on oral antibiotics.  CONSULTS OBTAINED:    DRUG ALLERGIES:  No Known Allergies  DISCHARGE MEDICATIONS:   Allergies as of 09/25/2018   No Known Allergies     Medication List    TAKE these medications   amLODipine 5 MG tablet  Commonly known as:  NORVASC Take 5 mg by mouth daily.   cholecalciferol 1000 units tablet Commonly known as:  VITAMIN D Take 1,000 Units by mouth daily.   ciprofloxacin 500 MG tablet Commonly known as:  CIPRO Take 1 tablet (500 mg total) by mouth 2 (two) times daily for 10  days.   ELIQUIS 5 MG Tabs tablet Generic drug:  apixaban TAKE ONE TABLET BY MOUTH TWICE A DAY What changed:  how much to take   estradiol 0.1 MG/GM vaginal cream Commonly known as:  ESTRACE Apply 0.5mg  (pea-sized amount)  just inside the vaginal introitus with a finger-tip on Monday, Wednesday and Friday nights.   ferrous sulfate 325 (65 FE) MG tablet Take 1 tablet (325 mg total) by mouth daily.   folic acid 277 MCG tablet Commonly known as:  FOLVITE Take 400 mcg by mouth daily.   ipratropium-albuterol 0.5-2.5 (3) MG/3ML Soln Commonly known as:  DUONEB Take 3 mLs by nebulization every 4 (four) hours as needed (for shortness of breath/wheezing).   JAKAFI 15 MG tablet Generic drug:  ruxolitinib phosphate TAKE 1 TABLET (15 MG TOTAL) BY MOUTH 2 TIMES DAILY. What changed:  See the new instructions.   metoprolol tartrate 25 MG tablet Commonly known as:  LOPRESSOR Take 0.5 tablets (12.5 mg total) by mouth 2 (two) times daily.   pantoprazole 40 MG tablet Commonly known as:  PROTONIX Take 1 tablet (40 mg total) by mouth daily.       Today  Patient seen and evaluated today No fever Tolerating diet well  VITAL SIGNS:  Blood pressure 118/77, pulse 63, temperature 98.5 F (36.9 C), temperature source Axillary, resp. rate 18, height 5\' 7"  (1.702 m), weight 93 kg, SpO2 100 %.  I/O:    Intake/Output Summary (Last 24 hours) at 09/25/2018 1425 Last data filed at 09/25/2018 0659 Gross per 24 hour  Intake 555.07 ml  Output 700 ml  Net -144.93 ml    PHYSICAL EXAMINATION:  Physical Exam  GENERAL:  84 y.o.-year-old patient lying in the bed with no acute distress.  LUNGS: Normal breath sounds bilaterally, no wheezing, rales,rhonchi or crepitation. No use of accessory muscles of respiration.  CARDIOVASCULAR: S1, S2 normal. No murmurs, rubs, or gallops.  ABDOMEN: Soft, non-tender, non-distended. Bowel sounds present. No organomegaly or mass.  NEUROLOGIC: Moves all 4  extremities. PSYCHIATRIC: The patient is alert and oriented x 3.  SKIN: No obvious rash, lesion, or ulcer.   DATA REVIEW:   CBC Recent Labs  Lab 09/25/18 0431  WBC 11.3*  HGB 7.4*  HCT 25.3*  PLT 176    Chemistries  Recent Labs  Lab 09/21/18 1705 09/22/18 0352 09/25/18 0431  NA 145 144  --   K 4.1 4.3  --   CL 106 107  --   CO2 31 28  --   GLUCOSE 109* 124*  --   BUN 14 12  --   CREATININE 0.64 0.37* 0.41*  CALCIUM 9.0 8.4*  --   AST 19  --   --   ALT 10  --   --   ALKPHOS 71  --   --   BILITOT 0.7  --   --     Cardiac Enzymes Recent Labs  Lab 09/21/18 1705  TROPONINI 0.03*    Microbiology Results  Results for orders placed or performed during the hospital encounter of 09/21/18  Blood culture (routine x 2)     Status: Abnormal   Collection Time: 09/21/18  6:23  PM  Result Value Ref Range Status   Specimen Description   Final    BLOOD RIGHT ANTECUBITAL Performed at [facility], [address], [place], [place] [address]    Special Requests   Final    BOTTLES DRAWN AEROBIC AND ANAEROBIC Blood Culture adequate volume Performed at [facility], [address] [place], [place] [address]    Culture  Setup Time   Final    GRAM NEGATIVE RODS AEROBIC BOTTLE ONLY CRITICAL VALUE NOTED.  VALUE IS CONSISTENT WITH PREVIOUSLY REPORTED AND CALLED VALUE. Performed at [facility], [address] [place], [place] [address]    Culture (A)  Final    ENTEROBACTER CLOACAE SUSCEPTIBILITIES PERFORMED ON PREVIOUS CULTURE WITHIN THE LAST 5 DAYS. Performed at [facility], [place] [address] [place], [place] [address]    Report Status 09/24/2018 FINAL  Final  Blood culture (routine x 2)     Status: Abnormal (Preliminary result)   Collection Time: 09/21/18  6:23 PM  Result Value Ref Range Status   Specimen Description   Final    BLOOD LEFT ANTECUBITAL Performed at [facility], [address], [place], [place] [address]     Special Requests   Final    BOTTLES DRAWN AEROBIC AND ANAEROBIC Blood Culture adequate volume Performed at [facility], [address] [place], [place] [address]    Culture  Setup Time   Final    GRAM NEGATIVE RODS AEROBIC BOTTLE ONLY CRITICAL RESULT CALLED TO, READ BACK BY AND VERIFIED WITH: KAREN HAYES 09/22/18 1253 KLW Performed at [facility], [place] [address] [place],  [address]    Culture ENTEROBACTER SPECIES (A)  Final   Report Status PENDING  Incomplete   Organism ID, Bacteria ENTEROBACTER SPECIES  Final      Susceptibility   Enterobacter species - MIC*    CEFAZOLIN >=64 RESISTANT Resistant     CEFEPIME <=1 SENSITIVE Sensitive     CEFTAZIDIME <=1 SENSITIVE Sensitive     CEFTRIAXONE <=1 SENSITIVE Sensitive     CIPROFLOXACIN <=0.25 SENSITIVE Sensitive     GENTAMICIN <=1 SENSITIVE Sensitive     IMIPENEM <=0.25 SENSITIVE Sensitive     TRIMETH/SULFA <=20 SENSITIVE Sensitive     PIP/TAZO 16 SENSITIVE Sensitive     * ENTEROBACTER SPECIES  Blood Culture ID Panel (Reflexed)     Status: Abnormal   Collection Time: 09/21/18  6:23 PM  Result Value Ref Range Status   Enterococcus species NOT DETECTED NOT DETECTED Final   Listeria monocytogenes NOT DETECTED NOT DETECTED Final   Staphylococcus species NOT DETECTED NOT DETECTED Final   Staphylococcus aureus (BCID) NOT DETECTED NOT DETECTED Final   Streptococcus species NOT DETECTED NOT DETECTED Final   Streptococcus agalactiae NOT DETECTED NOT DETECTED Final   Streptococcus pneumoniae NOT DETECTED NOT DETECTED Final   Streptococcus pyogenes NOT DETECTED NOT DETECTED Final   Acinetobacter baumannii NOT DETECTED NOT DETECTED Final   Enterobacteriaceae species DETECTED (A) NOT DETECTED Final    Comment: Enterobacteriaceae represent a large family of gram-negative bacteria, not a single organism. CRITICAL RESULT CALLED TO, READ BACK BY AND VERIFIED WITH: KAREN HAYES 09/22/18 1253 KLW    Enterobacter cloacae  complex DETECTED (A) NOT DETECTED Final    Comment: CRITICAL RESULT CALLED TO, READ BACK BY AND VERIFIED WITH: KAREN HAYES 09/22/18 1253 KLW    Escherichia coli NOT DETECTED NOT DETECTED Final   Klebsiella oxytoca NOT DETECTED NOT DETECTED Final  Klebsiella pneumoniae NOT DETECTED NOT DETECTED Final   Proteus species NOT DETECTED NOT DETECTED Final   Serratia marcescens NOT DETECTED NOT DETECTED Final   Carbapenem resistance NOT DETECTED NOT DETECTED Final   Haemophilus influenzae NOT DETECTED NOT DETECTED Final   Neisseria meningitidis NOT DETECTED NOT DETECTED Final   Pseudomonas aeruginosa NOT DETECTED NOT DETECTED Final   Candida albicans NOT DETECTED NOT DETECTED Final   Candida glabrata NOT DETECTED NOT DETECTED Final   Candida krusei NOT DETECTED NOT DETECTED Final   Candida parapsilosis NOT DETECTED NOT DETECTED Final   Candida tropicalis NOT DETECTED NOT DETECTED Final    Comment: Performed at [facility], [address] [place], [place] [address]  Urine culture     Status: Abnormal   Collection Time: 09/21/18  6:24 PM  Result Value Ref Range Status   Specimen Description   Final    URINE, CLEAN CATCH Performed at [facility], [address] [place], [place] [address]    Special Requests   Final    NONE Performed at [facility], [place],  [address]    Culture 40,000 COLONIES/mL YEAST (A)  Final   Report Status 09/23/2018 FINAL  Final    RADIOLOGY:  No results found.  Follow up with PCP in 1 week.  Management plans discussed with the patient, family and they are in agreement.  CODE STATUS: Full code    Code Status Orders  (From admission, onward)         Start     Ordered   09/22/18 0134  Full code  Continuous     09/22/18 0133        Code Status History    Date Active Date Inactive Code Status Order ID Comments User Context   09/09/2018 1240 09/12/2018 2008 Full Code [id number]  Loletha Grayer, MD ED   08/30/2018 2018 09/01/2018 2004 DNR [id number]  Nicholes Mango, MD ED   08/30/2018 1903 08/30/2018 2018 Full Code [id number]  Nicholes Mango, MD ED   08/13/2018 1038 08/15/2018 1836 Full Code [id number]  Loletha Grayer, MD Inpatient   08/12/2018 2001 08/13/2018 1038 DNR [id number]  Salary, Avel Peace, MD ED   08/12/2018 2001 08/12/2018 2001 Full Code [id number]  Salary, Avel Peace, MD ED   06/15/2018 1635 06/18/2018 2009 Full Code [id number]  [name], MD Inpatient   02/24/2018 0347 02/27/2018 0027 Full Code [id number]  Amelia Jo, MD Inpatient   10/15/2017 1141 10/16/2017 2204 Full Code [id number]  Knox Royalty, NP Inpatient   10/13/2017 2246 10/15/2017 1141 DNR [id number]  Lance Coon, MD ED   10/06/2017 1454 10/09/2017 2058 DNR [id number]  Nicholes Mango, MD Inpatient   10/06/2017 1111 10/06/2017 1453 Full Code [id number]  Nicholes Mango, MD Inpatient   05/27/2017 1954 05/28/2017 2013 Full Code [id number]  Vaughan Basta, MD Inpatient   05/23/2017 2322 05/26/2017 1614 Full Code [id number]  Lance Coon, MD Inpatient   04/10/2017 0325 04/11/2017 1916 Full Code [id number]  Harrie Foreman, MD Inpatient   11/23/2016 0629 12/01/2016 1935 Full Code [id number]  Harrie Foreman, MD Inpatient   08/26/2016 0445 08/26/2016 1923 Full Code [id number]  Saundra Shelling, MD Inpatient   09/22/2015 1755 09/25/2015 1844 Full Code [id number]  Demetrios Loll, MD ED   09/15/2015 0341 09/17/2015 1741 Full Code [id number]  Harrie Foreman, MD Inpatient   07/16/2015 0151 07/20/2015 2334 Full Code [id number]  Lance Coon, MD Inpatient   05/25/2015 0834 05/29/2015 1616 Full Code  [id number]  Harrie Foreman, MD Inpatient   04/02/2015 0606 04/08/2015 1651 Full Code [id number]  Harrie Foreman, MD Inpatient    Advance Directive Documentation     Most Recent Value  Type of Advance Directive  Healthcare Power of Attorney, Living will  Pre-existing out of facility DNR order (yellow form or pink MOST form)  -  "MOST" Form in  Place?  -      TOTAL TIME TAKING CARE OF THIS PATIENT ON DAY OF DISCHARGE: more than 33 minutes.   Saundra Shelling M.D on 09/25/2018 at 2:25 PM  Between 7am to 6pm - Pager - [phone]  After 6pm go to [email or web address] - password [email or web address] [facility]  Office  [phone]  CC: Primary care physician; [name], MD  Note: This dictation was prepared with Dragon dictation along with smaller phrase technology. Any transcriptional errors that result from this process are unintentional.

## 2018-09-25 NOTE — Care Management Note (Signed)
Case Management Note  Patient Details  Name: Yolanda Wells MRN: [id number] Date of Birth: [birth year]  Subjective/Objective:   Patient to be discharged per MD order. Orders in place for home health services. Patient currently open to [facility] and prefers to resume care with them. Notified Cheryl of referral for resumption of RN,PT and aide. No DME needs. Patient will leave via EMS                  Action/Plan:   Expected Discharge Date:  09/25/18               Expected Discharge Plan:  [place]  In-House Referral:     Discharge planning Services  CM Consult  Post Acute Care Choice:  Resumption of Svcs/PTA Provider Choice offered to:  Patient, [facility] POA / Guardian  DME Arranged:    DME Agency:     HH Arranged:  RN, PT, Nurse's Aide [place] Agency:  [place]  Status of Service:  Completed, signed off  If discussed at [place] of Stay Meetings, dates discussed:    Additional Comments:  Yolanda Drown [name], RN 09/25/2018, 11:04 AM

## 2018-09-26 NOTE — Progress Notes (Signed)
EMS here to transport pt home, granddaughter [place] home waiting for pt.  Per granddaughters request home med (chemo drug) was placed in personal belongings bag and sent with EMS.

## 2018-09-27 ENCOUNTER — Emergency Department: Payer: Medicare Other

## 2018-09-27 ENCOUNTER — Inpatient Hospital Stay
Admission: EM | Admit: 2018-09-27 | Discharge: 2018-09-30 | DRG: 689 | Disposition: A | Discharge status: Hospice | Payer: Medicare Other | Attending: Internal Medicine | Admitting: Internal Medicine

## 2018-09-27 ENCOUNTER — Other Ambulatory Visit: Payer: Self-pay

## 2018-09-27 DIAGNOSIS — N3000 Acute cystitis without hematuria: Secondary | ICD-10-CM | POA: Diagnosis not present

## 2018-09-27 DIAGNOSIS — J449 Chronic obstructive pulmonary disease, unspecified: Secondary | ICD-10-CM | POA: Diagnosis present

## 2018-09-27 DIAGNOSIS — D45 Polycythemia vera: Secondary | ICD-10-CM | POA: Diagnosis present

## 2018-09-27 DIAGNOSIS — F419 Anxiety disorder, unspecified: Secondary | ICD-10-CM | POA: Diagnosis present

## 2018-09-27 DIAGNOSIS — Z86718 Personal history of other venous thrombosis and embolism: Secondary | ICD-10-CM | POA: Diagnosis not present

## 2018-09-27 DIAGNOSIS — Z96643 Presence of artificial hip joint, bilateral: Secondary | ICD-10-CM | POA: Diagnosis present

## 2018-09-27 DIAGNOSIS — Z7401 Bed confinement status: Secondary | ICD-10-CM

## 2018-09-27 DIAGNOSIS — I5032 Chronic diastolic (congestive) heart failure: Secondary | ICD-10-CM | POA: Diagnosis present

## 2018-09-27 DIAGNOSIS — Z89431 Acquired absence of right foot: Secondary | ICD-10-CM

## 2018-09-27 DIAGNOSIS — Z79899 Other long term (current) drug therapy: Secondary | ICD-10-CM | POA: Diagnosis not present

## 2018-09-27 DIAGNOSIS — G9341 Metabolic encephalopathy: Secondary | ICD-10-CM | POA: Diagnosis present

## 2018-09-27 DIAGNOSIS — J441 Chronic obstructive pulmonary disease with (acute) exacerbation: Secondary | ICD-10-CM | POA: Diagnosis not present

## 2018-09-27 DIAGNOSIS — Z86711 Personal history of pulmonary embolism: Secondary | ICD-10-CM | POA: Diagnosis not present

## 2018-09-27 DIAGNOSIS — R0603 Acute respiratory distress: Secondary | ICD-10-CM | POA: Diagnosis present

## 2018-09-27 DIAGNOSIS — Z515 Encounter for palliative care: Secondary | ICD-10-CM | POA: Diagnosis present

## 2018-09-27 DIAGNOSIS — Z933 Colostomy status: Secondary | ICD-10-CM | POA: Diagnosis not present

## 2018-09-27 DIAGNOSIS — Z7189 Other specified counseling: Secondary | ICD-10-CM | POA: Diagnosis not present

## 2018-09-27 DIAGNOSIS — Z95828 Presence of other vascular implants and grafts: Secondary | ICD-10-CM

## 2018-09-27 DIAGNOSIS — R0602 Shortness of breath: Secondary | ICD-10-CM | POA: Diagnosis not present

## 2018-09-27 DIAGNOSIS — Z66 Do not resuscitate: Secondary | ICD-10-CM | POA: Diagnosis present

## 2018-09-27 DIAGNOSIS — R4182 Altered mental status, unspecified: Secondary | ICD-10-CM

## 2018-09-27 DIAGNOSIS — Z79818 Long term (current) use of other agents affecting estrogen receptors and estrogen levels: Secondary | ICD-10-CM

## 2018-09-27 DIAGNOSIS — I110 Hypertensive heart disease with heart failure: Secondary | ICD-10-CM | POA: Diagnosis present

## 2018-09-27 DIAGNOSIS — I69354 Hemiplegia and hemiparesis following cerebral infarction affecting left non-dominant side: Secondary | ICD-10-CM | POA: Diagnosis not present

## 2018-09-27 DIAGNOSIS — R627 Adult failure to thrive: Secondary | ICD-10-CM | POA: Diagnosis not present

## 2018-09-27 DIAGNOSIS — D638 Anemia in other chronic diseases classified elsewhere: Secondary | ICD-10-CM | POA: Diagnosis present

## 2018-09-27 DIAGNOSIS — N390 Urinary tract infection, site not specified: Principal | ICD-10-CM | POA: Diagnosis present

## 2018-09-27 DIAGNOSIS — Z7901 Long term (current) use of anticoagulants: Secondary | ICD-10-CM

## 2018-09-27 LAB — CBC WITH DIFFERENTIAL/PLATELET
ABS IMMATURE GRANULOCYTES: 0.78 10*3/uL — AB (ref 0.00–0.07)
BASOS PCT: 0 %
Basophils Absolute: 0 10*3/uL (ref 0.0–0.1)
Eosinophils Absolute: 0.1 10*3/uL (ref 0.0–0.5)
Eosinophils Relative: 1 %
HCT: 31.5 % — ABNORMAL LOW (ref 36.0–46.0)
HEMOGLOBIN: 9.2 g/dL — AB (ref 12.0–15.0)
Immature Granulocytes: 8 %
LYMPHS ABS: 1.4 10*3/uL (ref 0.7–4.0)
LYMPHS PCT: 14 %
MCH: 24.9 pg — AB (ref 26.0–34.0)
MCHC: 29.2 g/dL — ABNORMAL LOW (ref 30.0–36.0)
MCV: 85.4 fL (ref 80.0–100.0)
Monocytes Absolute: 0.3 10*3/uL (ref 0.1–1.0)
Monocytes Relative: 3 %
NEUTROS PCT: 74 %
NRBC: 1.7 % — AB (ref 0.0–0.2)
Neutro Abs: 7.8 10*3/uL — ABNORMAL HIGH (ref 1.7–7.7)
PLATELETS: 205 10*3/uL (ref 150–400)
RBC: 3.69 MIL/uL — AB (ref 3.87–5.11)
RDW: 31.1 % — ABNORMAL HIGH (ref 11.5–15.5)
WBC: 10.3 10*3/uL (ref 4.0–10.5)

## 2018-09-27 LAB — URINALYSIS, COMPLETE (UACMP) WITH MICROSCOPIC
Bilirubin Urine: NEGATIVE
GLUCOSE, UA: NEGATIVE mg/dL
Hgb urine dipstick: NEGATIVE
KETONES UR: NEGATIVE mg/dL
NITRITE: NEGATIVE
PH: 7 (ref 5.0–8.0)
Protein, ur: NEGATIVE mg/dL
Specific Gravity, Urine: 1.013 (ref 1.005–1.030)
WBC, UA: >50 WBC/hpf — ABNORMAL HIGH (ref 0–5)

## 2018-09-27 LAB — BASIC METABOLIC PANEL
Anion gap: 7 (ref 5–15)
BUN: 10 mg/dL (ref 8–23)
CO2: 33 mmol/L — AB (ref 22–32)
Calcium: 9.2 mg/dL (ref 8.9–10.3)
Chloride: 103 mmol/L (ref 98–111)
Creatinine, Ser: 0.41 mg/dL — ABNORMAL LOW (ref 0.44–1.00)
GFR calc Af Amer: >60 mL/min (ref >60)
GFR calc non Af Amer: >60 mL/min (ref >60)
GLUCOSE: 112 mg/dL — AB (ref 70–99)
POTASSIUM: 3.7 mmol/L (ref 3.5–5.1)
Sodium: 143 mmol/L (ref 135–145)

## 2018-09-27 LAB — BRAIN NATRIURETIC PEPTIDE: B Natriuretic Peptide: 95 pg/mL (ref 0.0–100.0)

## 2018-09-27 LAB — TROPONIN I: Troponin I: 0.05 ng/mL (ref <0.03)

## 2018-09-27 LAB — CG4 I-STAT (LACTIC ACID): Lactic Acid, Venous: 0.69 mmol/L (ref 0.5–1.9)

## 2018-09-27 MED ORDER — SODIUM CHLORIDE 0.9 % IV SOLN
2.0000 g | Freq: Two times a day (BID) | INTRAVENOUS | Status: AC
  Administered 2018-09-28 – 2018-09-29 (×4): 2 g via INTRAVENOUS
  Filled 2018-09-27 (×4): qty 2

## 2018-09-27 MED ORDER — VITAMIN D 25 MCG (1000 UNIT) PO TABS
1000.0000 [IU] | ORAL_TABLET | Freq: Every day | ORAL | Status: DC
  Administered 2018-09-28 – 2018-09-30 (×3): 1000 [IU] via ORAL
  Filled 2018-09-27 (×3): qty 1

## 2018-09-27 MED ORDER — HALOPERIDOL LACTATE 5 MG/ML IJ SOLN: 1.0000 mg | Freq: Four times a day (QID) | INTRAMUSCULAR | Status: DC | PRN

## 2018-09-27 MED ORDER — ACETAMINOPHEN 650 MG RE SUPP: 650.0000 mg | Freq: Four times a day (QID) | RECTAL | Status: DC | PRN

## 2018-09-27 MED ORDER — ONDANSETRON HCL 4 MG PO TABS: 4.0000 mg | ORAL_TABLET | Freq: Four times a day (QID) | ORAL | Status: DC | PRN

## 2018-09-27 MED ORDER — FOLIC ACID 1 MG PO TABS
500.0000 ug | ORAL_TABLET | Freq: Every day | ORAL | Status: DC
  Administered 2018-09-28 – 2018-09-30 (×3): 0.5 mg via ORAL
  Filled 2018-09-27 (×3): qty 1

## 2018-09-27 MED ORDER — APIXABAN 5 MG PO TABS
5.0000 mg | ORAL_TABLET | Freq: Two times a day (BID) | ORAL | Status: DC
  Administered 2018-09-27 – 2018-09-30 (×6): 5 mg via ORAL
  Filled 2018-09-27 (×6): qty 1

## 2018-09-27 MED ORDER — AMLODIPINE BESYLATE 5 MG PO TABS
5.0000 mg | ORAL_TABLET | Freq: Every day | ORAL | Status: DC
  Administered 2018-09-28 – 2018-09-30 (×3): 5 mg via ORAL
  Filled 2018-09-27 (×3): qty 1

## 2018-09-27 MED ORDER — ACETAMINOPHEN 325 MG PO TABS
650.0000 mg | ORAL_TABLET | Freq: Four times a day (QID) | ORAL | Status: DC | PRN
  Administered 2018-09-29: 650 mg via ORAL
  Filled 2018-09-27: qty 2

## 2018-09-27 MED ORDER — RUXOLITINIB PHOSPHATE 15 MG PO TABS
15.0000 mg | ORAL_TABLET | Freq: Two times a day (BID) | ORAL | Status: DC
  Administered 2018-09-27 – 2018-09-30 (×6): 15 mg via ORAL
  Filled 2018-09-27 (×7): qty 1

## 2018-09-27 MED ORDER — ESTRADIOL 0.1 MG/GM VA CREA
1.0000 | TOPICAL_CREAM | VAGINAL | Status: DC
  Administered 2018-09-29: 1 via VAGINAL
  Filled 2018-09-27: qty 42.5

## 2018-09-27 MED ORDER — IPRATROPIUM-ALBUTEROL 0.5-2.5 (3) MG/3ML IN SOLN
3.0000 mL | Freq: Four times a day (QID) | RESPIRATORY_TRACT | Status: DC
  Administered 2018-09-27 – 2018-09-28 (×4): 3 mL via RESPIRATORY_TRACT
  Filled 2018-09-27 (×4): qty 3

## 2018-09-27 MED ORDER — PANTOPRAZOLE SODIUM 40 MG PO TBEC
40.0000 mg | DELAYED_RELEASE_TABLET | Freq: Every day | ORAL | Status: DC
  Administered 2018-09-28 – 2018-09-30 (×3): 40 mg via ORAL
  Filled 2018-09-27 (×3): qty 1

## 2018-09-27 MED ORDER — BUDESONIDE 0.5 MG/2ML IN SUSP
0.5000 mg | Freq: Two times a day (BID) | RESPIRATORY_TRACT | Status: DC
  Administered 2018-09-27: 0.5 mg via RESPIRATORY_TRACT
  Filled 2018-09-27: qty 2

## 2018-09-27 MED ORDER — QUETIAPINE FUMARATE 25 MG PO TABS
25.0000 mg | ORAL_TABLET | Freq: Every day | ORAL | Status: DC
  Administered 2018-09-27 – 2018-09-29 (×3): 25 mg via ORAL
  Filled 2018-09-27 (×3): qty 1

## 2018-09-27 MED ORDER — FERROUS SULFATE 325 (65 FE) MG PO TABS
325.0000 mg | ORAL_TABLET | Freq: Every day | ORAL | Status: DC
  Administered 2018-09-28 – 2018-09-30 (×3): 325 mg via ORAL
  Filled 2018-09-27 (×3): qty 1

## 2018-09-27 MED ORDER — ONDANSETRON HCL 4 MG/2ML IJ SOLN: 4.0000 mg | Freq: Four times a day (QID) | INTRAMUSCULAR | Status: DC | PRN

## 2018-09-27 MED ORDER — METOPROLOL TARTRATE 25 MG PO TABS
12.5000 mg | ORAL_TABLET | Freq: Two times a day (BID) | ORAL | Status: DC
  Administered 2018-09-27 – 2018-09-30 (×6): 12.5 mg via ORAL
  Filled 2018-09-27 (×6): qty 1

## 2018-09-27 MED ORDER — SODIUM CHLORIDE 0.9 % IV SOLN
2.0000 g | Freq: Once | INTRAVENOUS | Status: AC
  Administered 2018-09-27: 2 g via INTRAVENOUS
  Filled 2018-09-27: qty 2

## 2018-09-27 MED ORDER — MORPHINE SULFATE (CONCENTRATE) 10 MG/0.5ML PO SOLN: 5.0000 mg | ORAL | Status: DC | PRN

## 2018-09-27 NOTE — Progress Notes (Signed)
Patient ID: Yolanda Wells, female   DOB: 1932-01-08, 84 y.o.   MRN: [id number]  ACP note  Patient and granddaughter at the bedside  Diagnosis: Acute delirium, recent Enterobacter sepsisPatient, shortness of breath with air hunger, essential hypertension, polycythemia vera, previous stroke and chronic left-sided weakness, history of DVT and PE.  CODE STATUS discussed and patient is now a DO NOT RESUSCITATE  Plan.  Treat acute delirium and try to get mental status better with her sleeping at night.  Continue treatment for Enterobacter sepsis.  Trial of Roxanol with shortness of breath and air hunger.  Palliative care consultation with conversion to hospice as outpatient.  Time spent on ACP discussion 17 minutes Dr. Loletha Grayer

## 2018-09-27 NOTE — ED Provider Notes (Signed)
[facility] Emergency Department Provider Note  ___________________________________________   First MD Initiated Contact with Patient 09/27/18 1527     (approximate)  I have reviewed the triage vital signs and the nursing notes.   HISTORY  Chief Complaint Shortness of Breath   HPI Yolanda Wells is a 84 y.o. female with a history of pulmonary embolism on Eliquis was presented emergency department with chest pain as well as shortness of breath that started 30 minutes ago.  He says the chest pain is a 9 out of 10 to the center of her chest and feels like a pressure.  She denies any radiation of the pain.  Says that it is associated with shortness of breath.  EMS reported that family also noted that she was confused.  Patient recently in the emergency department for sepsis and discharged 2 days ago.  Source appears to be cystitis with COPD exacerbation as well.   Past Medical History:  Diagnosis Date  . Acute deep vein thrombosis (DVT) of distal vein of left lower extremity ([place]) 10/03/2015  . Acute pulmonary embolism ([place]) 10/03/2015  . Anticoagulant long-term use   . Arthritis   . Arthritis   . CHF (congestive heart failure) ([place])   . Chronic anticoagulation 10/03/2015  . Collagen vascular disease ([place])   . Colostomy in place [facility]) 10/04/2015  . Deep venous thrombosis (HCC)    right lower extremity  . Dependent edema   . Diverticulosis of intestine with bleeding 04/02/2015  . Glenohumeral arthritis 06/28/2012  . Hammertoe 09/02/2012  . History of bilateral hip replacements 09/02/2012  . History of hysterectomy   . Hypertension   . Hypertension   . Hypokalemia   . Inability to ambulate due to hip 10/12/2015  . Mandibular dysfunction   . Onychomycosis 09/02/2012  . Osteoarthritis   . Polycythemia vera ([place])   . Presence of IVC filter 05/25/2015  . Stroke ([place]) 03/26/2015   cerebellar  . Urinary incontinence in female 09/02/2016    Patient  Active Problem List   Diagnosis Date Noted  . COPD exacerbation ([place]) 09/22/2018  . Sepsis ([place]) 09/22/2018  . Lower GI bleed 09/09/2018  . AMS (altered mental status) 08/30/2018  . Pressure injury of skin 08/13/2018  . B12 deficiency 08/04/2018  . Anemia 06/07/2018  . Secondary myelofibrosis ([place]) 02/17/2018  . Goals of care, counseling/discussion 12/23/2017  . Palliative care by specialist   . DNR (do not resuscitate) discussion   . Weakness generalized   . Chronic diastolic CHF (congestive heart failure) ([place]) 07/28/2017  . Vascular dementia without behavioral disturbance ([place]) 07/08/2017  . Influenza with respiratory manifestation 12/01/2016  . Respiratory distress 11/23/2016  . Urinary incontinence in female 09/02/2016  . Vision loss of right eye 08/26/2016  . Blindness of right eye 08/26/2016  . Inability to ambulate due to hip 10/12/2015  . Colostomy in place [facility]) 10/04/2015  . Long term current use of anticoagulant therapy 10/03/2015  . Leukocytosis 09/25/2015  . CAP (community acquired pneumonia) 09/24/2015  . COPD (chronic obstructive pulmonary disease) ([place]) 09/24/2015  . UTI (urinary tract infection) 07/16/2015  . Abdominal wall cellulitis 07/15/2015  . DVT (deep venous thrombosis) ([place]) 07/15/2015  . HTN (hypertension) 07/15/2015  . Polycythemia vera ([place]) 07/15/2015  . Arthritis 07/15/2015  . Pulmonary emboli ([place]) 05/25/2015  . Diverticulosis of colon with hemorrhage 04/02/2015  . Diverticulosis of intestine with bleeding 04/02/2015  . Fall 01/10/2015  . Osteoarthritis 01/10/2015  . Hammertoe 09/02/2012  . S/P  transmetatarsal amputation of foot ([place]) 09/02/2012  . Onychomycosis 09/02/2012  . Other specified dermatoses 09/02/2012  . S/P hip replacement 08/23/2012  . Glenohumeral arthritis 06/28/2012    Past Surgical History:  Procedure Laterality Date  . ABDOMINAL HYSTERECTOMY    . BLEPHAROPLASTY Right 03/2017   right upper eyelid  . CARDIAC  CATHETERIZATION Right 04/03/2015   Procedure: CENTRAL LINE INSERTION;  Surgeon: Sherri Rad, MD;  Location: [facility] ORS;  Service: General;  Laterality: Right;  . COLECTOMY WITH COLOSTOMY CREATION/HARTMANN PROCEDURE N/A 04/03/2015   Procedure: COLECTOMY WITH COLOSTOMY CREATION/HARTMANN PROCEDURE;  Surgeon: Sherri Rad, MD;  Location: [facility] ORS;  Service: General;  Laterality: N/A;  . COLON SURGERY    . FOOT AMPUTATION     partial  . FOOT AMPUTATION Right   . IVC FILTER INSERTION    . JOINT REPLACEMENT     Left and Right Hip  . PERIPHERAL VASCULAR CATHETERIZATION N/A 04/02/2015   Procedure: Visceral Angiography;  Surgeon: Algernon Huxley, MD;  Location: [place] [facility];  Service: Cardiovascular;  Laterality: N/A;  . PERIPHERAL VASCULAR CATHETERIZATION N/A 04/02/2015   Procedure: Visceral Artery Intervention;  Surgeon: Algernon Huxley, MD;  Location: [place] CV LAB;  Service: Cardiovascular;  Laterality: N/A;  . PERIPHERAL VASCULAR CATHETERIZATION N/A 05/25/2015   Procedure: IVC Filter Insertion;  Surgeon: Katha Cabal, MD;  Location: [place] CV LAB;  Service: Cardiovascular;  Laterality: N/A;  . TOE AMPUTATION     right  . TOTAL HIP ARTHROPLASTY      Prior to Admission medications   Medication Sig Start Date End Date Taking? Authorizing Provider  amLODipine (NORVASC) 5 MG tablet Take 5 mg by mouth daily.    [provider]  cholecalciferol (VITAMIN D) 1000 units tablet Take 1,000 Units by mouth daily.    [provider]  ciprofloxacin (CIPRO) 500 MG tablet Take 1 tablet (500 mg total) by mouth 2 (two) times daily for 10 days. 09/25/18 10/05/18  Pyreddy, Reatha Harps, MD  ELIQUIS 5 MG TABS tablet TAKE ONE TABLET BY MOUTH TWICE A DAY Patient taking differently: Take 5 mg by mouth 2 (two) times daily.  07/12/18   Lequita Asal, MD  estradiol (ESTRACE VAGINAL) 0.1 MG/GM vaginal cream Apply 0.5mg  (pea-sized amount)  just inside the vaginal introitus with a finger-tip on Monday,  Wednesday and Friday nights. 08/09/18   Zara Council A, PA-C  ferrous sulfate 325 (65 FE) MG tablet Take 1 tablet (325 mg total) by mouth daily. 09/11/18 12/10/18  [name], MD  folic acid (FOLVITE) 093 MCG tablet Take 400 mcg by mouth daily.    [provider]  ipratropium-albuterol (DUONEB) 0.5-2.5 (3) MG/3ML SOLN Take 3 mLs by nebulization every 4 (four) hours as needed (for shortness of breath/wheezing).     [provider]  JAKAFI 15 MG tablet TAKE 1 TABLET (15 MG TOTAL) BY MOUTH 2 TIMES DAILY. Patient taking differently: Take 15 mg by mouth 2 (two) times daily.  09/06/18   Karen Kitchens, NP  metoprolol tartrate (LOPRESSOR) 25 MG tablet Take 0.5 tablets (12.5 mg total) by mouth 2 (two) times daily. 09/01/18   Max Sane, MD  pantoprazole (PROTONIX) 40 MG tablet Take 1 tablet (40 mg total) by mouth daily. 09/12/18   [name], MD    Allergies Patient has no known allergies.  Family History  Problem Relation Age of Onset  . Brain cancer Mother   . Heart attack Father   . COPD Father   .  Coronary artery disease Father   . Hypertension Unknown   . Arthritis-Osteo Unknown   . Diabetes Brother   . Arthritis Brother   . Osteosarcoma Son   . Bone cancer Son   . Arthritis Sister   . Arthritis Brother   . Hypertension Brother   . Coronary artery disease Brother   . Malignant hyperthermia Brother     Social History Social History   Tobacco Use  . Smoking status: Never Smoker  . Smokeless tobacco: Never Used  Substance Use Topics  . Alcohol use: No  . Drug use: No    Review of Systems  Constitutional: No fever/chills Eyes: No visual changes. ENT: No sore throat. Cardiovascular: As above Respiratory: As above Gastrointestinal: No abdominal pain.  No nausea, no vomiting.  No diarrhea.  No constipation. Genitourinary: Negative for dysuria. Musculoskeletal: Negative for back pain. Skin: Negative for rash. Neurological: Negative for  headaches, focal weakness or numbness.   ____________________________________________   PHYSICAL EXAM:  VITAL SIGNS: ED Triage Vitals  Enc Vitals Group     BP 09/27/18 1536 (!) 162/84     Pulse Rate 09/27/18 1536 87     Resp 09/27/18 1536 (!) 30     Temp 09/27/18 1536 97.9 F (36.6 C)     Temp Source 09/27/18 1536 Oral     SpO2 09/27/18 1535 94 %     Weight 09/27/18 1537 205 lb 0.4 oz (93 kg)     Height 09/27/18 1537 5\' 7"  (1.702 m)     Head Circumference --      Peak Flow --      Pain Score 09/27/18 1537 9     Pain Loc --      Pain Edu? --      Excl. in [place]? --     Constitutional: Alert and oriented. Well appearing and in no acute distress. Eyes: Conjunctivae are normal.  Head: Atraumatic. Nose: No congestion/rhinnorhea. Mouth/Throat: Mucous membranes are moist.  Neck: No stridor.   Cardiovascular: Normal rate, regular rhythm. Grossly normal heart sounds.  Respiratory: Patient is tachypneic but without any wheezing.  Rales to the bilateral bases. Gastrointestinal: Soft and nontender. No distention.  Musculoskeletal: No lower extremity tenderness nor edema.  No joint effusions. Neurologic:  Normal speech and language. No gross focal neurologic deficits are appreciated. Skin:  Skin is warm, dry and intact. No rash noted. Psychiatric: Mood and affect are normal. Speech and behavior are normal.  ____________________________________________   LABS (all labs ordered are listed, but only abnormal results are displayed)  Labs Reviewed  BASIC METABOLIC PANEL - Abnormal; Notable for the following components:      Result Value   CO2 33 (*)    Glucose, Bld 112 (*)    Creatinine, Ser 0.41 (*)    All other components within normal limits  TROPONIN I - Abnormal; Notable for the following components:   Troponin I 0.05 (*)    All other components within normal limits  URINALYSIS, COMPLETE (UACMP) WITH MICROSCOPIC - Abnormal; Notable for the following components:   Color,  Urine YELLOW (*)    APPearance CLOUDY (*)    Leukocytes, UA LARGE (*)    RBC / HPF >50 (*)    WBC, UA >50 (*)    Bacteria, UA MANY (*)    All other components within normal limits  CULTURE, BLOOD (ROUTINE X 2)  CULTURE, BLOOD (ROUTINE X 2)  URINE CULTURE  CBC WITH DIFFERENTIAL/PLATELET  BRAIN NATRIURETIC PEPTIDE  I-STAT  CG4 LACTIC ACID, ED  CG4 I-STAT (LACTIC ACID)   ____________________________________________  EKG  ED ECG REPORT I, Doran Stabler, the attending physician, personally viewed and interpreted this ECG.   Date: 09/27/2018  EKG Time: 1530  Rate: 80  Rhythm: normal sinus rhythm  Axis: Short PR  Intervals:right bundle branch block  ST&T Change: No ST segment elevation or depression.  No abnormal T wave inversion. No significant change from previous. ____________________________________________  RADIOLOGY  No acute finding on the chest x-ray. ____________________________________________   PROCEDURES  Procedure(s) performed:  Angiocath insertion Performed by: Doran Stabler  Consent: Verbal consent obtained. Risks and benefits: risks, benefits and alternatives were discussed Time out: Immediately prior to procedure a "time out" was called to verify the correct patient, procedure, equipment, support staff and site/side marked as required.  Preparation: Patient was prepped and draped in the usual sterile fashion.  Vein Location: right basilic  Ultrasound Guided  Gauge: 18  Normal blood return and flush without difficulty Patient tolerance: Patient tolerated the procedure well with no immediate complications.     Procedures  Critical Care performed:   ____________________________________________   INITIAL IMPRESSION / ASSESSMENT AND PLAN / ED COURSE  Pertinent labs & imaging results that were available during my care of the patient were reviewed by me and considered in my medical decision making (see chart for  details).  Differential includes, but is not limited to, viral syndrome, bronchitis including COPD exacerbation, pneumonia, reactive airway disease including asthma, CHF including exacerbation with or without pulmonary/interstitial edema, pneumothorax, ACS, thoracic trauma, and pulmonary embolism. As part of my medical decision making, I reviewed the following data within the [facility] Notes from prior ED visits  ----------------------------------------- 6:27 PM on 09/27/2018 -----------------------------------------  Patient says that she is now chest pain-free as well as her breathing is returned to her baseline.  Patient's daughter at the bedside and states that the patient has chronic shortness of breath.  More likely unifying diagnosis is the urinary tract infection.  Patient to be admitted to the hospital.  Signed out to Dr. Earleen Newport.  ____________________________________________   FINAL CLINICAL IMPRESSION(S) / ED DIAGNOSES  UTI.  Altered mental status.  NEW MEDICATIONS STARTED DURING THIS VISIT:  New Prescriptions   No medications on file     Note:  This document was prepared using Dragon voice recognition software and may include unintentional dictation errors.     Orbie Pyo, MD 09/27/18 [phone]

## 2018-09-27 NOTE — ED Notes (Signed)
Troponin of 0.05 result verbally given to Dr Clearnce Hasten.

## 2018-09-27 NOTE — ED Notes (Signed)
Pt changed and repositioned in bed at this time.

## 2018-09-27 NOTE — ED Notes (Signed)
One set of cultures sent to lab by tammy ed tech. Unable to obtain second set due to difficult access. MD states to start antibiotics at this time

## 2018-09-27 NOTE — Consult Note (Signed)
CODE SEPSIS - PHARMACY COMMUNICATION  **Broad Spectrum Antibiotics should be administered within 1 hour of Sepsis diagnosis**  Time Code Sepsis Called/Page Received: 1711  Antibiotics Ordered: Cefepime  Time of 1st antibiotic administration: ~1815  Additional action taken by pharmacy: called Lorrie, RN - having trouble with IV access  If necessary, Name of Provider/Nurse Contacted: Lorrie, RN   Yolanda Wells, PharmD Pharmacy Resident  09/27/2018 5:58 PM

## 2018-09-27 NOTE — H&P (Signed)
[facility] NAME: Yolanda Wells    MR#:  [id number]  DATE OF BIRTH:  [birth year]  DATE OF ADMISSION:  09/27/2018  PRIMARY CARE PHYSICIAN: [name], MD   REQUESTING/REFERRING PHYSICIAN: Dr Larae Grooms  CHIEF COMPLAINT:   Chief Complaint  Patient presents with  . Shortness of Breath    HISTORY OF PRESENT ILLNESS:  Yolanda Wells  is a 84 y.o. female brought back into the hospital with shortness of breath.  The patient has a history of COPD.  The patient was recently admitted with gram-negative sepsis with Enterobacter.  The patient did not sleep last night and was agitated and did not recognize that she was in her home.  She did have this delirium in the hospital last visit.  As the patient's pulse ox at home was a little low at 86%.  Patient does hyperventilate a little bit with anxiety.  The patient has been talking that she has been tired.  Granddaughter and patient's agreeable to DO NOT RESUSCITATE at this time.  The palliative care may be converted over to hospice at home.  PAST MEDICAL HISTORY:   Past Medical History:  Diagnosis Date  . Acute deep vein thrombosis (DVT) of distal vein of left lower extremity ([place]) 10/03/2015  . Acute pulmonary embolism ([place]) 10/03/2015  . Anticoagulant long-term use   . Arthritis   . Arthritis   . CHF (congestive heart failure) ([place])   . Chronic anticoagulation 10/03/2015  . Collagen vascular disease ([place])   . Colostomy in place [facility]) 10/04/2015  . Deep venous thrombosis (HCC)    right lower extremity  . Dependent edema   . Diverticulosis of intestine with bleeding 04/02/2015  . Glenohumeral arthritis 06/28/2012  . Hammertoe 09/02/2012  . History of bilateral hip replacements 09/02/2012  . History of hysterectomy   . Hypertension   . Hypertension   . Hypokalemia   . Inability to ambulate due to hip 10/12/2015  . Mandibular dysfunction   . Onychomycosis 09/02/2012   . Osteoarthritis   . Polycythemia vera ([place])   . Presence of IVC filter 05/25/2015  . Stroke ([place]) 03/26/2015   cerebellar  . Urinary incontinence in female 09/02/2016    PAST SURGICAL HISTORY:   Past Surgical History:  Procedure Laterality Date  . ABDOMINAL HYSTERECTOMY    . BLEPHAROPLASTY Right 03/2017   right upper eyelid  . CARDIAC CATHETERIZATION Right 04/03/2015   Procedure: CENTRAL LINE INSERTION;  Surgeon: Sherri Rad, MD;  Location: [facility] ORS;  Service: General;  Laterality: Right;  . COLECTOMY WITH COLOSTOMY CREATION/HARTMANN PROCEDURE N/A 04/03/2015   Procedure: COLECTOMY WITH COLOSTOMY CREATION/HARTMANN PROCEDURE;  Surgeon: Sherri Rad, MD;  Location: [facility] ORS;  Service: General;  Laterality: N/A;  . COLON SURGERY    . FOOT AMPUTATION     partial  . FOOT AMPUTATION Right   . IVC FILTER INSERTION    . JOINT REPLACEMENT     Left and Right Hip  . PERIPHERAL VASCULAR CATHETERIZATION N/A 04/02/2015   Procedure: Visceral Angiography;  Surgeon: Algernon Huxley, MD;  Location: [place] CV LAB;  Service: Cardiovascular;  Laterality: N/A;  . PERIPHERAL VASCULAR CATHETERIZATION N/A 04/02/2015   Procedure: Visceral Artery Intervention;  Surgeon: Algernon Huxley, MD;  Location: [place] CV LAB;  Service: Cardiovascular;  Laterality: N/A;  . PERIPHERAL VASCULAR CATHETERIZATION N/A 05/25/2015   Procedure: IVC Filter Insertion;  Surgeon: Katha Cabal, MD;  Location: [place] CV LAB;  Service: Cardiovascular;  Laterality: N/A;  . TOE AMPUTATION     right  . TOTAL HIP ARTHROPLASTY      SOCIAL HISTORY:   Social History   Tobacco Use  . Smoking status: Never Smoker  . Smokeless tobacco: Never Used  Substance Use Topics  . Alcohol use: No    FAMILY HISTORY:   Family History  Problem Relation Age of Onset  . Brain cancer Mother   . Heart attack Father   . COPD Father   . Coronary artery disease Father   . Hypertension Unknown   . Arthritis-Osteo Unknown   .  Diabetes Brother   . Arthritis Brother   . Osteosarcoma Son   . Bone cancer Son   . Arthritis Sister   . Arthritis Brother   . Hypertension Brother   . Coronary artery disease Brother   . Malignant hyperthermia Brother     DRUG ALLERGIES:  No Known Allergies  REVIEW OF SYSTEMS:  CONSTITUTIONAL: No fever, positive for sweating.  Fatigue or weakness.  EYES: Decreased vision right eye EARS, NOSE, AND THROAT: No tinnitus or ear pain. No sore throat.  Positive runny nose RESPIRATORY: No cough.positive for shortness of breath.  No wheezing or hemoptysis.  CARDIOVASCULAR: Positive for chest pain, no orthopnea, edema.  GASTROINTESTINAL: No nausea, vomiting, diarrhea or abdominal pain. No blood in bowel movements GENITOURINARY: No dysuria, hematuria.  ENDOCRINE: No polyuria, nocturia,  HEMATOLOGY: No anemia, easy bruising or bleeding SKIN: No rash or lesion. MUSCULOSKELETAL: No joint pain or arthritis.   NEUROLOGIC: No tingling, numbness, weakness.  PSYCHIATRY: No anxiety or depression.   MEDICATIONS AT HOME:   Prior to Admission medications   Medication Sig Start Date End Date Taking? Authorizing Provider  amLODipine (NORVASC) 5 MG tablet Take 5 mg by mouth daily.   Yes [provider]  cholecalciferol (VITAMIN D) 1000 units tablet Take 1,000 Units by mouth daily.   Yes [provider]  ciprofloxacin (CIPRO) 500 MG tablet Take 1 tablet (500 mg total) by mouth 2 (two) times daily for 10 days. 09/25/18 10/05/18 Yes Pyreddy, [place], MD  ELIQUIS 5 MG TABS tablet TAKE ONE TABLET BY MOUTH TWICE A DAY Patient taking differently: Take 5 mg by mouth 2 (two) times daily.  07/12/18  Yes Lequita Asal, MD  estradiol (ESTRACE VAGINAL) 0.1 MG/GM vaginal cream Apply 0.5mg  (pea-sized amount)  just inside the vaginal introitus with a finger-tip on Monday, Wednesday and Friday nights. 08/09/18  Yes McGowan, Larene [place] A, PA-C  ferrous sulfate 325 (65 FE) MG tablet Take 1 tablet (325 mg  total) by mouth daily. 09/11/18 12/10/18 Yes [name], MD  folic acid (FOLVITE) 563 MCG tablet Take 400 mcg by mouth daily.   Yes [provider]  ipratropium-albuterol (DUONEB) 0.5-2.5 (3) MG/3ML SOLN Take 3 mLs by nebulization every 4 (four) hours as needed (for shortness of breath/wheezing).    Yes [provider]  JAKAFI 15 MG tablet TAKE 1 TABLET (15 MG TOTAL) BY MOUTH 2 TIMES DAILY. Patient taking differently: Take 15 mg by mouth 2 (two) times daily.  09/06/18  Yes Karen Kitchens, NP  metoprolol tartrate (LOPRESSOR) 25 MG tablet Take 0.5 tablets (12.5 mg total) by mouth 2 (two) times daily. 09/01/18  Yes Max Sane, MD  pantoprazole (PROTONIX) 40 MG tablet Take 1 tablet (40 mg total) by mouth daily. 09/12/18  Yes [name], MD      VITAL SIGNS:  Blood pressure (!) 162/84, pulse 87, temperature 97.9  F (36.6 C), temperature source Oral, resp. rate (!) 30, height 5\' 7"  (1.702 m), weight 93 kg, SpO2 94 %.  PHYSICAL EXAMINATION:  GENERAL:  84 y.o.-year-old patient lying in the bed with no acute distress.  EYES: Pupils equal, round, reactive to light and accommodation. No scleral icterus. Extraocular muscles intact.  HEENT: Head atraumatic, normocephalic. Oropharynx and nasopharynx clear.  NECK:  Supple, no jugular venous distention. No thyroid enlargement, no tenderness.  LUNGS: Normal breath sounds bilaterally, no wheezing, rales,rhonchi or crepitation.  Positive use of accessory muscles of respiration.   CARDIOVASCULAR: S1, S2 normal. No murmurs, rubs, or gallops.  ABDOMEN: Soft, nontender, nondistended. Bowel sounds present. No organomegaly or mass.  EXTREMITIES: No pedal edema, cyanosis, or clubbing.  NEUROLOGIC: Cranial nerves II through XII are intact. Muscle strength 5/5 in all extremities. Sensation intact. Gait not checked.  PSYCHIATRIC: The patient is alert and oriented x 3.  SKIN: No rash, lesion, or ulcer.   LABORATORY PANEL:   CBC Recent  Labs  Lab 09/27/18 1708  WBC 10.3  HGB 9.2*  HCT 31.5*  PLT 205   ------------------------------------------------------------------------------------------------------------------  Chemistries  Recent Labs  Lab 09/21/18 1705  09/27/18 1708  NA 145   < > 143  K 4.1   < > 3.7  CL 106   < > 103  CO2 31   < > 33*  GLUCOSE 109*   < > 112*  BUN 14   < > 10  CREATININE 0.64   < > 0.41*  CALCIUM 9.0   < > 9.2  AST 19  --   --   ALT 10  --   --   ALKPHOS 71  --   --   BILITOT 0.7  --   --    < > = values in this interval not displayed.   ------------------------------------------------------------------------------------------------------------------  Cardiac Enzymes Recent Labs  Lab 09/27/18 1708  TROPONINI 0.05*   ------------------------------------------------------------------------------------------------------------------  RADIOLOGY:  Dg Chest 1 View  Result Date: 09/27/2018 CLINICAL DATA:  Weakness and shortness of breath.  Chest pain EXAM: CHEST  1 VIEW COMPARISON:  09/21/2018 FINDINGS: The heart size is enlarged. Aortic atherosclerosis noted. No pleural effusion or edema identified. No airspace opacities. Thoracic scoliosis deformity is identified. IMPRESSION: 1. No acute findings. 2.  Aortic Atherosclerosis (ICD10-I70.0). Electronically Signed   By: Kerby Moors M.D.   On: 09/27/2018 16:26    EKG:   Sinus rhythm 80 bpm, short PR, right bundle branch block  IMPRESSION AND PLAN:   1.  Acute delirium.  Start Haldol 1 mg every 6 hours PRN Seroquel 25 mg nightly.  Can consider Seroquel at home as needed. 2.  Recent admission with Enterobacter sepsis.  Switch antibiotics back to Maxipime. 3.  Shortness of breath with air hunger.  I do not believe this is COPD exacerbation.  Mental status got worse with Solu-Medrol last visit.  I will give nebulizer treatments with budesonide nebulizers.  The patient is on Eliquis so I do not believe that this is a blood clot.   Trial of Roxanol.  Palliative care consultation. 4.  Essential hypertension on Norvasc 5.  Polycythemia vera on Jackifi 6.  Previous stroke and chronic left-sided weakness on Eliquis 7.  History of DVT and PE and recent GI bleed.  Monitor hemoglobin.  Patient currently on Eliquis.  All the records are reviewed and case discussed with ED provider. Management plans discussed with the patient, family and they are in agreement.  CODE STATUS: DNR  TOTAL TIME TAKING CARE OF THIS PATIENT: 50 minutes,including acp time.    Loletha Grayer M.D on 09/27/2018 at 7:04 PM  Between 7am to 6pm - Pager - [phone]  After 6pm call admission pager [phone]  [facility]  [phone]  CC: Primary care physician; [name], MD

## 2018-09-27 NOTE — ED Notes (Signed)
Samantha RN, aware of bed assigned  

## 2018-09-27 NOTE — ED Notes (Signed)
One set of blood cultures was collected and sent to lab

## 2018-09-27 NOTE — Consult Note (Signed)
Pharmacy Antibiotic Note  Yolanda Wells is a 84 y.o. female admitted on 09/27/2018 with UTIPatient may have potential COPD exacerbation, also. Patient was recently discharged 2 days ago with sepsis. Pharmacy has been consulted for Cefepime dosing.  Plan: Patient received Cefepime 2 g IV x 1 in the ED. Ordered Cefepime 2 g IV q12h.  Height: 5\' 7"  (170.2 cm) Weight: 205 lb 0.4 oz (93 kg) IBW/kg (Calculated) : 61.6  Temp (24hrs), Avg:97.9 F (36.6 C), Min:97.9 F (36.6 C), Max:97.9 F (36.6 C)  Recent Labs  Lab 09/21/18 1705 09/21/18 1825 09/22/18 0352 09/25/18 0431  WBC 15.0*  --  12.9* 11.3*  CREATININE 0.64  --  0.37* 0.41*  LATICACIDVEN  --  0.91  --   --     Estimated Creatinine Clearance: 61.3 mL/min (A) (by C-G formula based on SCr of 0.41 mg/dL (L)).    No Known Allergies  Antimicrobials this admission: 11/18 Cefepime >>   Dose adjustments this admission: N/A  Microbiology results: 11/18 BCx: sent 11/18 UCx: sent    Thank you for allowing pharmacy to be a part of this patient's care.  Paticia Stack, PharmD Pharmacy Resident  09/27/2018 5:52 PM

## 2018-09-28 ENCOUNTER — Encounter: Payer: Self-pay | Admitting: *Deleted

## 2018-09-28 DIAGNOSIS — Z515 Encounter for palliative care: Secondary | ICD-10-CM

## 2018-09-28 DIAGNOSIS — R627 Adult failure to thrive: Secondary | ICD-10-CM

## 2018-09-28 DIAGNOSIS — R0602 Shortness of breath: Secondary | ICD-10-CM

## 2018-09-28 DIAGNOSIS — Z7189 Other specified counseling: Secondary | ICD-10-CM

## 2018-09-28 DIAGNOSIS — J441 Chronic obstructive pulmonary disease with (acute) exacerbation: Secondary | ICD-10-CM

## 2018-09-28 LAB — BASIC METABOLIC PANEL
ANION GAP: 6 (ref 5–15)
BUN: 8 mg/dL (ref 8–23)
CHLORIDE: 106 mmol/L (ref 98–111)
CO2: 30 mmol/L (ref 22–32)
Calcium: 8.7 mg/dL — ABNORMAL LOW (ref 8.9–10.3)
Glucose, Bld: 98 mg/dL (ref 70–99)
Potassium: 4 mmol/L (ref 3.5–5.1)
Sodium: 142 mmol/L (ref 135–145)

## 2018-09-28 LAB — CBC
HCT: 27.8 % — ABNORMAL LOW (ref 36.0–46.0)
Hemoglobin: 7.9 g/dL — ABNORMAL LOW (ref 12.0–15.0)
MCH: 24.4 pg — ABNORMAL LOW (ref 26.0–34.0)
MCHC: 28.4 g/dL — AB (ref 30.0–36.0)
MCV: 85.8 fL (ref 80.0–100.0)
NRBC: 1.8 % — AB (ref 0.0–0.2)
Platelets: 159 10*3/uL (ref 150–400)
RBC: 3.24 MIL/uL — ABNORMAL LOW (ref 3.87–5.11)
RDW: 30.5 % — AB (ref 11.5–15.5)
WBC: 8.2 10*3/uL (ref 4.0–10.5)

## 2018-09-28 MED ORDER — DIPHENHYDRAMINE HCL 25 MG PO CAPS: 50.0000 mg | ORAL_CAPSULE | Freq: Four times a day (QID) | ORAL | Status: DC | PRN

## 2018-09-28 MED ORDER — IPRATROPIUM-ALBUTEROL 0.5-2.5 (3) MG/3ML IN SOLN
3.0000 mL | Freq: Three times a day (TID) | RESPIRATORY_TRACT | Status: DC
  Administered 2018-09-29 – 2018-09-30 (×4): 3 mL via RESPIRATORY_TRACT
  Filled 2018-09-28 (×4): qty 3

## 2018-09-28 NOTE — Consult Note (Signed)
Consultation Note Date: 09/28/2018   Patient Name: Yolanda Wells  DOB: February 03, 1932  MRN: [id number]  Age / Sex: 84 y.o., female  PCP: [name], MD Referring Physician: [name], MD  Reason for Consultation: Establishing goals of care and Psychosocial/spiritual support  HPI/Patient Profile: 84 y.o. female  admitted on 09/27/2018  with shortness of breath.  The patient has a history of COPD/respiratory failure on 2 L of oxygen at home,, DVT, PE on chronic anticoagulation, congestive heart failure.  The patient was recently admitted with gram-negative sepsis with Enterobacter.  She was discharged only a few days ago.  She is had multiple readmissions and ER visits over the last 6 months.  Patient and family face treatment option decisions, advanced directive decisions and anticipatory care needs.    She is high risk for decompensation.   Clinical Assessment and Goals of Care:  This NP Wadie Lessen reviewed medical records, received report from team, assessed the patient and then meet at the patient's bedside along with her granddaughter Yolanda Wells  to discuss diagnosis, prognosis, GOC, EOL wishes disposition and options.  Concept of Hospice and Palliative Care were discussed  A detailed discussion was had today regarding advanced directives.  Concepts specific to code status, artifical feeding and hydration, continued IV antibiotics and rehospitalization was had.  The difference between a aggressive medical intervention path  and a palliative comfort care path for this patient at this time was had.  Values and goals of care important to patient and family were attempted to be elicited.  Natural trajectory and expectations at EOL were discussed.  Questions and concerns addressed.   Family encouraged to call with questions or concerns.    PMT will continue to support holistically.   HCPOA/ Yolanda  Wells    SUMMARY OF RECOMMENDATIONS    Code Status/Advance Care Planning:  DNR--today family at bedside/healthcare power of attorney/Yolanda Wells  again questioned DNR status.  Yolanda tells me that her uncle is concerned that it was changed from full code to DNR.  See asks that I revisit the topic with the patient herself.  Spoke to Ms. Human regarding her desire for CPR when her heart and lungs stop and she clearly was able to verbalize that she would not want that  DNR remains in place   Palliative Prophylaxis:   Aspiration, Frequent Pain Assessment and Oral Care  Additional Recommendations (Limitations, Scope, Preferences):  Full Scope Treatment  Psycho-social/Spiritual:   Desire for further Chaplaincy support:no  Additional Recommendations: Education on Hospice  Prognosis:   < 6 months  Discharge Planning:  Granddaughter/ the  main caregiver at bedside tells me that the plan is for the patient to return home.  She tells me that she has spoken to [facility] hospice and that they have agreed to take her under their service.  Discussed with family  the importance of continued conversation with family memebers and their  medical providers regarding overall plan of care and treatment options,  ensuring decisions are within the context of the patients  values and GOCs.   Home with Hospice      Primary Diagnoses: Present on Admission: . Shortness of breath   I have reviewed the medical record, interviewed the patient and family, and examined the patient. The following aspects are pertinent.  Past Medical History:  Diagnosis Date  . Acute deep vein thrombosis (DVT) of distal vein of left lower extremity ([place]) 10/03/2015  . Acute pulmonary embolism ([place]) 10/03/2015  . Anticoagulant long-term use   . Arthritis   . Arthritis   . CHF (congestive heart failure) ([place])   . Chronic anticoagulation 10/03/2015  . Collagen vascular disease ([place])   . Colostomy in place [facility])  10/04/2015  . Deep venous thrombosis ([facility])    right lower extremity  . Dependent edema   . Diverticulosis of intestine with bleeding 04/02/2015  . Glenohumeral arthritis 06/28/2012  . Hammertoe 09/02/2012  . History of bilateral hip replacements 09/02/2012  . History of hysterectomy   . Hypertension   . Hypertension   . Hypokalemia   . Inability to ambulate due to hip 10/12/2015  . Mandibular dysfunction   . Onychomycosis 09/02/2012  . Osteoarthritis   . Polycythemia vera ([place])   . Presence of IVC filter 05/25/2015  . Stroke ([place]) 03/26/2015   cerebellar  . Urinary incontinence in female 09/02/2016   Social History   Socioeconomic History  . Marital status: Widowed    Spouse name: Not on file  . Number of children: 5  . Years of education: Not on file  . Highest education level: Not on file  Occupational History  . Occupation: retired  Scientific laboratory technician  . Financial resource strain: Not very hard  . Food insecurity:    Worry: Patient refused    Inability: Patient refused  . Transportation needs:    Medical: Patient refused    Non-medical: Patient refused  Tobacco Use  . Smoking status: Never Smoker  . Smokeless tobacco: Never Used  Substance and Sexual Activity  . Alcohol use: No  . Drug use: No  . Sexual activity: Never  Lifestyle  . Physical activity:    Days per week: Patient refused    Minutes per session: Patient refused  . Stress: Only a little  Relationships  . Social connections:    Talks on phone: Patient refused    Gets together: Patient refused    Attends religious service: Patient refused    Active member of club or organization: Patient refused    Attends meetings of clubs or organizations: Patient refused    Relationship status: Patient refused  Other Topics Concern  . Not on file  Social History Narrative   [facility]   Family History  Problem Relation Age of Onset  . Brain cancer Mother   . Heart attack Father   . COPD Father     . Coronary artery disease Father   . Hypertension Unknown   . Arthritis-Osteo Unknown   . Diabetes Brother   . Arthritis Brother   . Osteosarcoma Son   . Bone cancer Son   . Arthritis Sister   . Arthritis Brother   . Hypertension Brother   . Coronary artery disease Brother   . Malignant hyperthermia Brother    Scheduled Meds: . amLODipine  5 mg Oral Daily  . apixaban  5 mg Oral BID  . cholecalciferol  1,000 Units Oral Daily  . estradiol  1 Applicatorful Vaginal Once per day on Mon Wed Fri  . ferrous sulfate  325 mg Oral Q breakfast  . folic acid  371 mcg Oral Daily  . ipratropium-albuterol  3 mL Nebulization Q6H  . metoprolol tartrate  12.5 mg Oral BID  . pantoprazole  40 mg Oral Daily  . QUEtiapine  25 mg Oral QHS  . ruxolitinib phosphate  15 mg Oral BID   Continuous Infusions: . ceFEPime (MAXIPIME) IV 2 g (09/28/18 0924)   PRN Meds:.acetaminophen **OR** acetaminophen, diphenhydrAMINE, haloperidol lactate, morphine CONCENTRATE, ondansetron **OR** ondansetron (ZOFRAN) IV Medications Prior to Admission:  Prior to Admission medications   Medication Sig Start Date End Date Taking? Authorizing Provider  amLODipine (NORVASC) 5 MG tablet Take 5 mg by mouth daily.   Yes [provider]  cholecalciferol (VITAMIN D) 1000 units tablet Take 1,000 Units by mouth daily.   Yes [provider]  ciprofloxacin (CIPRO) 500 MG tablet Take 1 tablet (500 mg total) by mouth 2 (two) times daily for 10 days. 09/25/18 10/05/18 Yes Pyreddy, Pavan, MD  ELIQUIS 5 MG TABS tablet TAKE ONE TABLET BY MOUTH TWICE A DAY Patient taking differently: Take 5 mg by mouth 2 (two) times daily.  07/12/18  Yes Lequita Asal, MD  estradiol (ESTRACE VAGINAL) 0.1 MG/GM vaginal cream Apply 0.5mg  (pea-sized amount)  just inside the vaginal introitus with a finger-tip on Monday, Wednesday and Friday nights. 08/09/18  Yes McGowan, Larene Beach A, PA-C  ferrous sulfate 325 (65 FE) MG tablet Take 1 tablet (325  mg total) by mouth daily. 09/11/18 12/10/18 Yes [name], MD  folic acid (FOLVITE) 062 MCG tablet Take 400 mcg by mouth daily.   Yes [provider]  ipratropium-albuterol (DUONEB) 0.5-2.5 (3) MG/3ML SOLN Take 3 mLs by nebulization every 4 (four) hours as needed (for shortness of breath/wheezing).    Yes [provider]  JAKAFI 15 MG tablet TAKE 1 TABLET (15 MG TOTAL) BY MOUTH 2 TIMES DAILY. Patient taking differently: Take 15 mg by mouth 2 (two) times daily.  09/06/18  Yes Karen Kitchens, NP  metoprolol tartrate (LOPRESSOR) 25 MG tablet Take 0.5 tablets (12.5 mg total) by mouth 2 (two) times daily. 09/01/18  Yes Max Sane, MD  pantoprazole (PROTONIX) 40 MG tablet Take 1 tablet (40 mg total) by mouth daily. 09/12/18  Yes [name], MD   No Known Allergies Review of Systems  Neurological: Positive for weakness.    Physical Exam  Constitutional: She is oriented to person, place, and time. She appears well-developed.  Cardiovascular: Normal rate, regular rhythm and normal heart sounds.  Pulmonary/Chest: Effort normal and breath sounds normal.  Neurological: She is alert and oriented to person, place, and time.  Skin: Skin is warm and dry.    Vital Signs: BP (!) 154/90 (BP Location: Left Arm)   Pulse 64   Temp 98.1 F (36.7 C) (Oral)   Resp 17   Ht 5\' 7"  (1.702 m)   Wt 93 kg   SpO2 95%   BMI 32.11 kg/m  Pain Scale: 0-10   Pain Score: 5    SpO2: SpO2: 95 % O2 Device:SpO2: 95 % O2 Flow Rate: .O2 Flow Rate (L/min): 2 L/min  IO: Intake/output summary:   Intake/Output Summary (Last 24 hours) at 09/28/2018 1046 Last data filed at 09/28/2018 0900 Gross per 24 hour  Intake 460 ml  Output 550 ml  Net -90 ml    LBM: Last BM Date: 09/27/18 Baseline Weight: Weight: 93 kg Most recent weight: Weight: 93 kg     Palliative Assessment/Data:  30 %  Discussed with Yolanda Wells CMRN  Time In: 0900 Time Out: 1010 Time Total: 70 minutes Greater than  50%  of this time was spent counseling and coordinating care related to the above assessment and plan.  Signed by: Wadie Lessen, NP   Please contact Palliative Medicine Team phone at [phone] for questions and concerns.  For individual provider: See Shea Evans

## 2018-09-28 NOTE — Progress Notes (Signed)
[name] NAME: Yolanda Wells    MR#:  [id number]  DATE OF BIRTH:  03-16-1932  SUBJECTIVE:  CHIEF COMPLAINT:   Chief Complaint  Patient presents with  . Shortness of Breath   -Multiple admissions recently.  Patient was brought in secondary to dyspnea and confusion and noted to have UTI.  REVIEW OF SYSTEMS:  Review of Systems  Constitutional: Positive for malaise/fatigue. Negative for chills and fever.  HENT: Negative for congestion, ear discharge, hearing loss and nosebleeds.   Eyes: Negative for blurred vision and double vision.  Respiratory: Positive for shortness of breath. Negative for cough and wheezing.   Cardiovascular: Negative for chest pain and palpitations.  Gastrointestinal: Negative for abdominal pain, constipation, diarrhea, nausea and vomiting.  Genitourinary: Negative for dysuria.  Musculoskeletal: Negative for myalgias.  Neurological: Negative for dizziness, focal weakness, seizures, weakness and headaches.  Psychiatric/Behavioral: Negative for depression.    DRUG ALLERGIES:  No Known Allergies  VITALS:  Blood pressure 133/71, pulse 64, temperature 98.6 F (37 C), temperature source Oral, resp. rate 17, height 5\' 7"  (1.702 m), weight 93 kg, SpO2 99 %.  PHYSICAL EXAMINATION:  Physical Exam   GENERAL:  84 y.o.-year-old patient lying in the bed with no acute distress.  EYES: Pupils equal, round, reactive to light and accommodation. No scleral icterus. Extraocular muscles intact.  HEENT: Head atraumatic, normocephalic. Oropharynx and nasopharynx clear.  NECK:  Supple, no jugular venous distention. No thyroid enlargement, no tenderness.  LUNGS: Normal breath sounds bilaterally, no wheezing, rales,rhonchi or crepitation. No use of accessory muscles of respiration.  Decreased bibasilar breath sounds CARDIOVASCULAR: S1, S2 normal. No murmurs, rubs, or gallops.  ABDOMEN: Soft, nontender, nondistended. Bowel  sounds present.  Right lower quadrant colostomy bag in place with greenish stool.  No organomegaly or mass.  EXTREMITIES: No  cyanosis, or clubbing. 1+ bilateral lower extremity edema noted NEUROLOGIC: Cranial nerves II through XII are intact. Muscle strength 5/5 in both upper extremities and 3/5 in RLE and 2/5 in LLE. Sensation intact. Gait not checked. Global weakness noted.  PSYCHIATRIC: The patient is alert and oriented x 3.  SKIN: No obvious rash, lesion, or ulcer.    LABORATORY PANEL:   CBC Recent Labs  Lab 09/28/18 0358  WBC 8.2  HGB 7.9*  HCT 27.8*  PLT 159   ------------------------------------------------------------------------------------------------------------------  Chemistries  Recent Labs  Lab 09/21/18 1705  09/28/18 0358  NA 145   < > 142  K 4.1   < > 4.0  CL 106   < > 106  CO2 31   < > 30  GLUCOSE 109*   < > 98  BUN 14   < > 8  CREATININE 0.64   < > <0.30*  CALCIUM 9.0   < > 8.7*  AST 19  --   --   ALT 10  --   --   ALKPHOS 71  --   --   BILITOT 0.7  --   --    < > = values in this interval not displayed.   ------------------------------------------------------------------------------------------------------------------  Cardiac Enzymes Recent Labs  Lab 09/27/18 1708  TROPONINI 0.05*   ------------------------------------------------------------------------------------------------------------------  RADIOLOGY:  Dg Chest 1 View  Result Date: 09/27/2018 CLINICAL DATA:  Weakness and shortness of breath.  Chest pain EXAM: CHEST  1 VIEW COMPARISON:  09/21/2018 FINDINGS: The heart size is enlarged. Aortic atherosclerosis noted. No pleural effusion or edema identified. No airspace opacities. Thoracic scoliosis deformity  is identified. IMPRESSION: 1. No acute findings. 2.  Aortic Atherosclerosis (ICD10-I70.0). Electronically Signed   By: Kerby Moors M.D.   On: 09/27/2018 16:26    EKG:   Orders placed or performed during the hospital encounter of  09/27/18  . EKG 12-Lead  . EKG 12-Lead  . EKG 12-Lead  . EKG 12-Lead  . ED EKG 12-Lead  . ED EKG 12-Lead    ASSESSMENT AND PLAN:   84 year old female with past medical history significant for stroke with left-sided weakness, history of DVT on Eliquis, status post IVC filter, history of ischemic colitis status post partial colectomy with a colostomy in place, hypertension presents to hospital from home secondary to confusion and delirium  1.  Acute encephalopathy-secondary to UTI -Recurrent admissions for UTI, urine again shows several bacteria -Recent admission for gram-negative sepsis with Enterobacter -On cefepime now -Waiting cultures at this time. -Mental status is improving  2.  Anemia of chronic disease-Baseline hemoglobin between 7 and 9, no active bleeding.  Monitor carefully especially as patient takes Eliquis  3.  COPD-stable at this time.  Occasional dyspneic episodes.  Chest x-ray is clear. -CT angios negative for PE and negative for any infection. -Inhalers and nebulizers  4.  Hypertension-Norvasc, metoprolol  5.  History of polycythemia vera-on Jakafi  6.  DVT prophylaxis-already on Eliquis Patient has history of DVT and PE with evidence of chronic clot, she is status post IVC filter.  Patient is bedbound at baseline.  Palliative care consult requested for repeat admissions     All the records are reviewed and case discussed with Care Management/Social Workerr. Management plans discussed with the patient, family and they are in agreement.  CODE STATUS: DNR  TOTAL TIME TAKING CARE OF THIS PATIENT: 38 minutes.   POSSIBLE D/C IN 2 DAYS, DEPENDING ON CLINICAL CONDITION.   [name] M.D on 09/28/2018 at 2:28 PM  Between 7am to 6pm - Pager - [phone]  After 6pm go to [email or web address] - password [email or web address] [place] [facility]  Office  [phone]  CC: Primary care physician; [name], MD

## 2018-09-28 NOTE — H&P (Addendum)
RNCM received notification from Palliative team that patient will transition to [facility] at discharge.  RNCM spoke with [place] granddaughter and she agrees. Patient is on nocturnal O2 at [facility]. [facility] updated that hospice will become payer. [name] said that patient will need EMS to home since she gets so short of breath with mobility.  Portable DNR placed on chart for MD signature.

## 2018-09-28 NOTE — Progress Notes (Signed)
Notified by [facility] Marshell Garfinkel that patient will be followed by [facility] at discharge. Palliative team notified. Flo Shanks RN, BSN, [facility] [place], hospital liaison [phone]

## 2018-09-28 NOTE — Care Management (Addendum)
Sharmon Revere with [facility] home health notified of patient re-admission to hospital. Update: patient is also followed by home [facility] [place].

## 2018-09-28 NOTE — Progress Notes (Signed)
Please note patient is currently followed by outpatient Palliative at home.CMRN Yolanda Wells made aware.  Flo Shanks RN, BSN, [facility] [place], hospital liaison [phone]

## 2018-09-29 LAB — CBC
HEMATOCRIT: 26.2 % — AB (ref 36.0–46.0)
HEMOGLOBIN: 7.9 g/dL — AB (ref 12.0–15.0)
MCH: 25.1 pg — AB (ref 26.0–34.0)
MCHC: 30.2 g/dL (ref 30.0–36.0)
MCV: 83.2 fL (ref 80.0–100.0)
NRBC: 4.2 % — AB (ref 0.0–0.2)
Platelets: 150 10*3/uL (ref 150–400)
RBC: 3.15 MIL/uL — AB (ref 3.87–5.11)
RDW: 30.5 % — ABNORMAL HIGH (ref 11.5–15.5)
WBC: 7.4 10*3/uL (ref 4.0–10.5)

## 2018-09-29 LAB — PATHOLOGIST SMEAR REVIEW

## 2018-09-29 LAB — URINE CULTURE: Culture: >=100000 — AB

## 2018-09-29 MED ORDER — FLUCONAZOLE IN SODIUM CHLORIDE 200-0.9 MG/100ML-% IV SOLN
200.0000 mg | INTRAVENOUS | Status: DC
  Administered 2018-09-29: 200 mg via INTRAVENOUS
  Filled 2018-09-29 (×2): qty 100

## 2018-09-29 MED ORDER — CIPROFLOXACIN HCL 500 MG PO TABS
500.0000 mg | ORAL_TABLET | Freq: Two times a day (BID) | ORAL | Status: DC
  Administered 2018-09-30: 500 mg via ORAL
  Filled 2018-09-29 (×2): qty 1

## 2018-09-29 MED ORDER — MORPHINE SULFATE (CONCENTRATE) 10 MG/0.5ML PO SOLN: 5.0000 mg | ORAL | Status: DC | PRN

## 2018-09-29 NOTE — Progress Notes (Signed)
[facility] at [place] NAME: Yolanda Wells    MR#:  [id number]  DATE OF BIRTH:  Apr 03, 1932  SUBJECTIVE:  CHIEF COMPLAINT:   Chief Complaint  Patient presents with  . Shortness of Breath   -Multiple admissions recently.  Patient was brought in secondary to dyspnea and confusion and noted to have UTI.  REVIEW OF SYSTEMS:  Review of Systems  Constitutional: Positive for malaise/fatigue. Negative for chills and fever.  HENT: Negative for congestion, ear discharge, hearing loss and nosebleeds.   Eyes: Negative for blurred vision and double vision.  Respiratory: Positive for shortness of breath. Negative for cough and wheezing.   Cardiovascular: Negative for chest pain and palpitations.  Gastrointestinal: Negative for abdominal pain, constipation, diarrhea, nausea and vomiting.  Genitourinary: Negative for dysuria.  Musculoskeletal: Negative for myalgias.  Neurological: Negative for dizziness, focal weakness, seizures, weakness and headaches.  Psychiatric/Behavioral: Negative for depression.    DRUG ALLERGIES:  No Known Allergies  VITALS:  Blood pressure (!) 121/58, pulse 64, temperature (!) 97.5 F (36.4 C), temperature source Oral, resp. rate 18, height 5\' 7"  (1.702 m), weight 93 kg, SpO2 97 %.  PHYSICAL EXAMINATION:  Physical Exam   GENERAL:  84 y.o.-year-old patient lying in the bed with no acute distress.  EYES: Pupils equal, round, reactive to light and accommodation. No scleral icterus. Extraocular muscles intact.  HEENT: Head atraumatic, normocephalic. Oropharynx and nasopharynx clear.  NECK:  Supple, no jugular venous distention. No thyroid enlargement, no tenderness.  LUNGS: Normal breath sounds bilaterally, no wheezing, rales,rhonchi or crepitation. No use of accessory muscles of respiration.  Decreased bibasilar breath sounds CARDIOVASCULAR: S1, S2 normal. No  rubs, or gallops. 2/6 systolic murmur ABDOMEN: Soft, nontender,  nondistended. Bowel sounds present.  Right lower quadrant colostomy bag in place with greenish stool.  No organomegaly or mass.  EXTREMITIES: No  cyanosis, or clubbing. 1+ bilateral lower extremity edema noted NEUROLOGIC: Cranial nerves II through XII are intact. Muscle strength 5/5 in both upper extremities and 3/5 in RLE and 2/5 in LLE. Sensation intact. Gait not checked. Global weakness noted.  PSYCHIATRIC: The patient is alert and oriented x 3.  SKIN: No obvious rash, lesion, or ulcer.    LABORATORY PANEL:   CBC Recent Labs  Lab 09/29/18 0333  WBC 7.4  HGB 7.9*  HCT 26.2*  PLT 150   ------------------------------------------------------------------------------------------------------------------  Chemistries  Recent Labs  Lab 09/28/18 0358  NA 142  K 4.0  CL 106  CO2 30  GLUCOSE 98  BUN 8  CREATININE <0.30*  CALCIUM 8.7*   ------------------------------------------------------------------------------------------------------------------  Cardiac Enzymes Recent Labs  Lab 09/27/18 1708  TROPONINI 0.05*   ------------------------------------------------------------------------------------------------------------------  RADIOLOGY:  Dg Chest 1 View  Result Date: 09/27/2018 CLINICAL DATA:  Weakness and shortness of breath.  Chest pain EXAM: CHEST  1 VIEW COMPARISON:  09/21/2018 FINDINGS: The heart size is enlarged. Aortic atherosclerosis noted. No pleural effusion or edema identified. No airspace opacities. Thoracic scoliosis deformity is identified. IMPRESSION: 1. No acute findings. 2.  Aortic Atherosclerosis (ICD10-I70.0). Electronically Signed   By: Kerby Moors M.D.   On: 09/27/2018 16:26    EKG:   Orders placed or performed during the hospital encounter of 09/27/18  . EKG 12-Lead  . EKG 12-Lead  . EKG 12-Lead  . EKG 12-Lead  . ED EKG 12-Lead  . ED EKG 12-Lead    ASSESSMENT AND PLAN:   84 year old female with past medical history significant for  stroke  with left-sided weakness, history of DVT on Eliquis, status post IVC filter, history of ischemic colitis status post partial colectomy with a colostomy in place, hypertension presents to hospital from home secondary to confusion and delirium  1.  Acute encephalopathy-secondary to UTI -Recurrent admissions for UTI, urine again shows several bacteria -Recent admission for gram-negative sepsis with Enterobacter -Cultures are negative now.  Urine cultures growing only yeast. -Discontinue cefepime.  Restarted Cipro to finish off the 10-day course -Mental status is improving  2.  Anemia of chronic disease-Baseline hemoglobin between 7 and 9, no active bleeding.  Monitor carefully especially as patient takes Eliquis  3.  COPD-stable at this time.  Occasional dyspneic episodes.  Chest x-ray is clear. -CT angios negative for PE and negative for any infection from 09/21/2018. -Inhalers and nebulizers  4.  Hypertension-Norvasc, metoprolol  5.  History of polycythemia vera-on Jakafi  6.  DVT prophylaxis-already on Eliquis Patient has history of DVT and PE with evidence of chronic clot, she is status post IVC filter.  Patient is bedbound at baseline.  Palliative care consult requested for repeat admissions To be discharged home with hospice tomorrow     All the records are reviewed and case discussed with Care Management/Social Workerr. Management plans discussed with the patient, family and they are in agreement.  CODE STATUS: DNR  TOTAL TIME TAKING CARE OF THIS PATIENT: 36 minutes.   POSSIBLE D/C TOMORROW, DEPENDING ON CLINICAL CONDITION.   [name] M.D on 09/29/2018 at 3:04 PM  Between 7am to 6pm - Pager - [phone]  After 6pm go to [email or web address] - password [email or web address] [place] [facility]  Office  [phone]  CC: Primary care physician; [name], MD

## 2018-09-29 NOTE — Plan of Care (Signed)
Tele contacted to inform SVT (150s) x34 beats.  Dr. Text to inform.

## 2018-09-30 DIAGNOSIS — R4182 Altered mental status, unspecified: Secondary | ICD-10-CM

## 2018-09-30 MED ORDER — FLUCONAZOLE 100 MG PO TABS: 100.0000 mg | ORAL_TABLET | Freq: Every day | ORAL | 0 refills | Status: AC

## 2018-09-30 MED ORDER — MORPHINE SULFATE (CONCENTRATE) 10 MG/0.5ML PO SOLN: 5.0000 mg | ORAL | 0 refills | Status: DC | PRN

## 2018-09-30 NOTE — Discharge Summary (Signed)
[facility] NAME: Yolanda Wells    MR#:  [id number]  DATE OF BIRTH:  Nov 21, 1931  DATE OF ADMISSION:  09/27/2018   ADMITTING PHYSICIAN: Loletha Grayer, MD  DATE OF DISCHARGE: 09/30/2018 12:15 PM  PRIMARY CARE PHYSICIAN: [name], MD   ADMISSION DIAGNOSIS:   Acute cystitis without hematuria [N30.00] Altered mental status, unspecified altered mental status type [R41.82]  DISCHARGE DIAGNOSIS:   Active Problems:   Shortness of breath   SECONDARY DIAGNOSIS:   Past Medical History:  Diagnosis Date  . Acute deep vein thrombosis (DVT) of distal vein of left lower extremity ([place]) 10/03/2015  . Acute pulmonary embolism ([place]) 10/03/2015  . Anticoagulant long-term use   . Arthritis   . Arthritis   . CHF (congestive heart failure) ([place])   . Chronic anticoagulation 10/03/2015  . Collagen vascular disease ([place])   . Colostomy in place [facility]) 10/04/2015  . Deep venous thrombosis ([facility])    right lower extremity  . Dependent edema   . Diverticulosis of intestine with bleeding 04/02/2015  . Glenohumeral arthritis 06/28/2012  . Hammertoe 09/02/2012  . History of bilateral hip replacements 09/02/2012  . History of hysterectomy   . Hypertension   . Hypertension   . Hypokalemia   . Inability to ambulate due to hip 10/12/2015  . Mandibular dysfunction   . Onychomycosis 09/02/2012  . Osteoarthritis   . Polycythemia vera ([place])   . Presence of IVC filter 05/25/2015  . Stroke ([place]) 03/26/2015   cerebellar  . 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  Acute encephalopathy-secondary to  UTI -Recurrent admissions for UTI, urine again shows several bacteria -Recent admission for gram-negative sepsis with Enterobacter -Cultures are negative now.  Urine cultures growing only yeast. -Discontinued cefepime.  Restarted Cipro to finish off the 10-day course from last admission.  Fluconazole given for urine yeast infection because patient is symptomatic with changes in mental status on admission -Mental status is improved, now alert oriented x3  2.  Anemia of chronic disease-Baseline hemoglobin between 7 and 9, no active bleeding.  Monitor carefully especially as patient takes Eliquis  3.  COPD-stable at this time.  Occasional dyspneic episodes.  Chest x-ray is clear. -CT angios negative for PE and negative for any infection from 09/21/2018. -Inhalers and nebulizers  4.  Hypertension-Norvasc, metoprolol  5.  History of polycythemia vera-on Jakafi  6.  H/o DVT and PE- on Eliquis Patient has history of DVT and PE with evidence of chronic clot, she is status post IVC filter.  Patient is bedbound at baseline.  Palliative care consult appreciated for repeat admissions -Will be discharged home with resuming home health services to transition to hospice care at home soon   DISCHARGE CONDITIONS:   Guarded  CONSULTS OBTAINED:   None  DRUG ALLERGIES:   No Known Allergies DISCHARGE MEDICATIONS:   Allergies as of 09/30/2018   No Known Allergies     Medication List    TAKE these medications   amLODipine 5 MG tablet Commonly known as:  NORVASC Take 5 mg by mouth daily.   cholecalciferol 1000 units tablet Commonly known as:  VITAMIN D Take 1,000  Units by mouth daily.   ciprofloxacin 500 MG tablet Commonly known as:  CIPRO Take 1 tablet (500 mg total) by mouth 2 (two) times daily for 10 days.   ELIQUIS 5 MG Tabs tablet Generic drug:  apixaban TAKE ONE TABLET BY MOUTH TWICE A DAY What changed:  how much to take   estradiol 0.1 MG/GM vaginal cream Commonly  known as:  ESTRACE Apply 0.5mg  (pea-sized amount)  just inside the vaginal introitus with a finger-tip on Monday, Wednesday and Friday nights.   ferrous sulfate 325 (65 FE) MG tablet Take 1 tablet (325 mg total) by mouth daily.   fluconazole 100 MG tablet Commonly known as:  DIFLUCAN Take 1 tablet (100 mg total) by mouth daily for 7 days.   folic acid 244 MCG tablet Commonly known as:  FOLVITE Take 400 mcg by mouth daily.   ipratropium-albuterol 0.5-2.5 (3) MG/3ML Soln Commonly known as:  DUONEB Take 3 mLs by nebulization every 4 (four) hours as needed (for shortness of breath/wheezing).   JAKAFI 15 MG tablet Generic drug:  ruxolitinib phosphate TAKE 1 TABLET (15 MG TOTAL) BY MOUTH 2 TIMES DAILY. What changed:  See the new instructions.   metoprolol tartrate 25 MG tablet Commonly known as:  LOPRESSOR Take 0.5 tablets (12.5 mg total) by mouth 2 (two) times daily.   morphine CONCENTRATE 10 MG/0.5ML Soln concentrated solution Take 0.25 mLs (5 mg total) by mouth every 4 (four) hours as needed for moderate pain, severe pain or shortness of breath.   pantoprazole 40 MG tablet Commonly known as:  PROTONIX Take 1 tablet (40 mg total) by mouth daily.        DISCHARGE INSTRUCTIONS:   1.  PCP follow-up in 1 to 2 weeks 2.  Patient will resume her home health services to transition to hospice at home.  DIET:   Cardiac diet  ACTIVITY:   Activity as tolerated  OXYGEN:   Home Oxygen: No.  Oxygen Delivery: room air  DISCHARGE LOCATION:   home   If you experience worsening of your admission symptoms, develop shortness of breath, life threatening emergency, suicidal or homicidal thoughts you must seek medical attention immediately by calling 911 or calling your MD immediately  if symptoms less severe.  You Must read complete instructions/literature along with all the possible adverse reactions/side effects for all the Medicines you take and that have been prescribed to you.  Take any new Medicines after you have completely understood and accpet all the possible adverse reactions/side effects.   Please note  You were cared for by a hospitalist during your hospital stay. If you have any questions about your discharge medications or the care you received while you were in the hospital after you are discharged, you can call the unit and asked to speak with the hospitalist on call if the hospitalist that took care of you is not available. Once you are discharged, your primary care physician will handle any further medical issues. Please note that NO REFILLS for any discharge medications will be authorized once you are discharged, as it is imperative that you return to your primary care physician (or establish a relationship with a primary care physician if you do not have one) for your aftercare needs so that they can reassess your need for medications and monitor your lab values.    On the day of Discharge:  VITAL SIGNS:   Blood pressure 121/60, pulse (!) 56, temperature 98.3 F (36.8 C), temperature source Oral, resp. rate 16,  height 5\' 7"  (1.702 m), weight 93 kg, SpO2 100 %.  PHYSICAL EXAMINATION:    GENERAL:84 y.o.-year-old patient lying in the bed with no acute distress.  EYES: Pupils equal, round, reactive to light and accommodation. No scleral icterus. Extraocular muscles intact.  HEENT: Head atraumatic, normocephalic. Oropharynx and nasopharynx clear.  NECK: Supple, no jugular venous distention. No thyroid enlargement, no tenderness.  LUNGS: Normal breath sounds bilaterally, no wheezing, rales,rhonchi or crepitation. No use of accessory muscles of respiration. Decreased bibasilar breath sounds CARDIOVASCULAR: S1, S2 normal. No  rubs, or gallops. 2/6 systolic murmur ABDOMEN: Soft, nontender, nondistended. Bowel sounds present. Right lower quadrant colostomy bag in place with greenish stool. No organomegaly or mass.  EXTREMITIES: No cyanosis, or clubbing.  1+ bilateral lower extremity edema noted NEUROLOGIC: Cranial nerves II through XII are intact. Muscle strength 5/5 in both upper extremities and 3/5 in RLE and 2/5 in LLE. Sensation intact. Gait not checked. Global weakness noted.  PSYCHIATRIC: The patient is alert and oriented x 3.  SKIN: No obvious rash, lesion, or ulcer.   DATA REVIEW:   CBC Recent Labs  Lab 09/29/18 0333  WBC 7.4  HGB 7.9*  HCT 26.2*  PLT 150    Chemistries  Recent Labs  Lab 09/28/18 0358  NA 142  K 4.0  CL 106  CO2 30  GLUCOSE 98  BUN 8  CREATININE <0.30*  CALCIUM 8.7*     Microbiology Results  Results for orders placed or performed during the hospital encounter of 09/27/18  Blood Culture (routine x 2)     Status: None (Preliminary result)   Collection Time: 09/27/18  5:10 PM  Result Value Ref Range Status   Specimen Description BLOOD RIGHT ARM  Final   Special Requests   Final    BOTTLES DRAWN AEROBIC AND ANAEROBIC Blood Culture adequate volume   Culture   Final    NO GROWTH 3 DAYS Performed at [facility], [address], [place], [place] [address]    Report Status PENDING  Incomplete  Urine culture     Status: Abnormal   Collection Time: 09/27/18  5:10 PM  Result Value Ref Range Status   Specimen Description   Final    URINE, RANDOM Performed at [facility], [address] [place], [place] [address]    Special Requests   Final    NONE Performed at [facility], [address] [place], [place] [address]    Culture >=100,000 COLONIES/mL YEAST (A)  Final   Report Status 09/29/2018 FINAL  Final  Blood Culture (routine x 2)     Status: None (Preliminary result)   Collection Time: 09/27/18  8:45 PM  Result Value Ref Range Status   Specimen Description BLOOD BLOOD LEFT HAND  Final   Special Requests   Final    BOTTLES DRAWN AEROBIC AND ANAEROBIC Blood Culture results may not be optimal due to an inadequate volume of blood received in culture bottles    Culture   Final    NO GROWTH 3 DAYS Performed at [facility], [address], [place], [place] [address]    Report Status PENDING  Incomplete    RADIOLOGY:  No results found.   Management plans discussed with the patient, family and they are in agreement.  CODE STATUS:     Code Status Orders  (From admission, onward)         Start     Ordered   09/27/18 1901  Do  not attempt resuscitation (DNR)  Continuous    Question Answer Comment  In the event of cardiac or respiratory ARREST Do not call a "code blue"   In the event of cardiac or respiratory ARREST Do not perform Intubation, CPR, defibrillation or ACLS   In the event of cardiac or respiratory ARREST Use medication by any route, position, wound care, and other measures to relive pain and suffering. May use oxygen, suction and manual treatment of airway obstruction as needed for comfort.   Comments nurse may pronounce      09/27/18 1901        Code Status History    Date Active Date Inactive Code Status Order ID Comments User Context   09/22/2018 0133 09/25/2018 2201 Full Code [id number]  Amelia Jo, MD Inpatient   09/09/2018 1240 09/12/2018 2008 Full Code [id number]  Loletha Grayer, MD ED   08/30/2018 2018 09/01/2018 2004 DNR [id number]  Nicholes Mango, MD ED   08/30/2018 1903 08/30/2018 2018 Full Code [id number]  Nicholes Mango, MD ED   08/13/2018 1038 08/15/2018 1836 Full Code [id number]  Loletha Grayer, MD Inpatient   08/12/2018 2001 08/13/2018 1038 DNR [id number]  Gorden Harms, MD ED   08/12/2018 2001 08/12/2018 2001 Full Code [id number]  Salary, Avel Peace, MD ED   06/15/2018 1635 06/18/2018 2009 Full Code [id number]  [name], MD Inpatient   02/24/2018 0347 02/27/2018 0027 Full Code [id number]  Amelia Jo, MD Inpatient   10/15/2017 1141 10/16/2017 2204 Full Code [id number]  Knox Royalty, NP Inpatient   10/13/2017 2246 10/15/2017 1141 DNR [id number]  Lance Coon, MD ED   10/06/2017 1454 10/09/2017 2058 DNR  [id number]  Nicholes Mango, MD Inpatient   10/06/2017 1111 10/06/2017 1453 Full Code [id number]  Nicholes Mango, MD Inpatient   05/27/2017 1954 05/28/2017 2013 Full Code [id number]  Vaughan Basta, MD Inpatient   05/23/2017 2322 05/26/2017 1614 Full Code [id number]  Lance Coon, MD Inpatient   04/10/2017 0325 04/11/2017 1916 Full Code [id number]  Harrie Foreman, MD Inpatient   11/23/2016 0629 12/01/2016 1935 Full Code [id number]  Harrie Foreman, MD Inpatient   08/26/2016 0445 08/26/2016 1923 Full Code [id number]  Saundra Shelling, MD Inpatient   09/22/2015 1755 09/25/2015 1844 Full Code [id number]  Demetrios Loll, MD ED   09/15/2015 0341 09/17/2015 1741 Full Code [id number]  Harrie Foreman, MD Inpatient   07/16/2015 0151 07/20/2015 2334 Full Code [id number]  Lance Coon, MD Inpatient   05/25/2015 0834 05/29/2015 1616 Full Code [id number]  Harrie Foreman, MD Inpatient   04/02/2015 0606 04/08/2015 1651 Full Code [id number]  Harrie Foreman, MD Inpatient    Advance Directive Documentation     Most Recent Value  Type of Advance Directive  Healthcare Power of Attorney  Pre-existing out of facility DNR order (yellow form or pink MOST form)  Pink MOST form placed in chart (order not valid for inpatient use)  "MOST" Form in Place?  -      TOTAL TIME TAKING CARE OF THIS PATIENT: 38 minutes.    [name] M.D on 09/30/2018 at 3:10 PM  Between 7am to 6pm - Pager - [phone]  After 6pm go to [email or web address] - Proofreader  [facility]  Office  [phone]  CC: Primary care physician; [name], MD   Note: This dictation was prepared with Dragon dictation along with smaller phrase technology. Any transcriptional errors that result from this process are unintentional.

## 2018-09-30 NOTE — Progress Notes (Signed)
Patient ID: Yolanda Wells, female   DOB: 1931/12/04, 84 y.o.   MRN: [id number]  This NP visited patient at the bedside as a follow up to  yesterday's [place].   [name] is main Media planner.  Patient is more alert and engaged, no complaints offered  Plan is home with [facility] service when stable.  No further PMT needs at this time  Discussed with patient the importance of continued conversation with family and their  medical providers regarding overall plan of care and treatment options,  ensuring decisions are within the context of the patients values and GOCs.  Questions and concerns addressed   Discussed with Dr Wyvonne Lenz  Total time spent on the unit was 15 minutes  Greater than 50% of the time was spent in counseling and coordination of care  Wadie Lessen NP  Palliative Medicine Team Team Phone # [phone] Pager [phone]

## 2018-09-30 NOTE — Progress Notes (Signed)
Family notified of plan of care and ems notified.

## 2018-09-30 NOTE — Care Management (Addendum)
MD please sign transport DNR for EMS.  EMS packet completed after DNR form added to packed.  [name] with [facility] hosp[facility] updated that patient anticipates discharge to home today. RNCm will follow. Update: RNCM received notification that home health will see patient and transition her to their hospice on Monday. MD updated.

## 2018-09-30 NOTE — Care Management Important Message (Signed)
Important Message  Patient Details  Name: Yolanda Wells MRN: [id number] Date of Birth: 12/28/1931   Medicare Important Message Given:  Yes    Yolanda Wells 09/30/2018, 11:03 AM

## 2018-09-30 NOTE — Progress Notes (Signed)
Family notified of discharge no answer will attempt call again.

## 2018-09-30 NOTE — Progress Notes (Signed)
D/C patient. ABC intact. Medications given to EMS. Reports to EMS - pt also known to them. No further needs. Moved to EMS stretcher - patient tolerated well.

## 2018-09-30 NOTE — Care Management (Addendum)
DNR signed and placed in packet. Home health resumption of care and transition to hospice will be entered in MD note this afternoon.  No other RNCM needs. [name] granddaughter aware and agrees.

## 2018-10-02 LAB — CULTURE, BLOOD (ROUTINE X 2)
CULTURE: NO GROWTH
CULTURE: NO GROWTH
Special Requests: ADEQUATE

## 2018-10-04 MED FILL — JAKAFI 15 MG TABLET: 15 | 30 days supply | Qty: 60 | Fill #1

## 2018-10-28 ENCOUNTER — Inpatient Hospital Stay
Admission: EM | Admit: 2018-10-28 | Discharge: 2018-10-31 | DRG: 689 | Disposition: A | Discharge status: Home Health | Payer: Medicare Other | Attending: Internal Medicine | Admitting: Internal Medicine

## 2018-10-28 ENCOUNTER — Other Ambulatory Visit: Payer: Self-pay

## 2018-10-28 ENCOUNTER — Encounter: Payer: Self-pay | Admitting: Emergency Medicine

## 2018-10-28 ENCOUNTER — Other Ambulatory Visit: Payer: Self-pay | Admitting: Urgent Care

## 2018-10-28 ENCOUNTER — Emergency Department: Payer: Medicare Other

## 2018-10-28 DIAGNOSIS — Z90710 Acquired absence of both cervix and uterus: Secondary | ICD-10-CM | POA: Diagnosis not present

## 2018-10-28 DIAGNOSIS — Z7951 Long term (current) use of inhaled steroids: Secondary | ICD-10-CM

## 2018-10-28 DIAGNOSIS — R0602 Shortness of breath: Secondary | ICD-10-CM | POA: Diagnosis present

## 2018-10-28 DIAGNOSIS — J45901 Unspecified asthma with (acute) exacerbation: Secondary | ICD-10-CM | POA: Diagnosis present

## 2018-10-28 DIAGNOSIS — M810 Age-related osteoporosis without current pathological fracture: Secondary | ICD-10-CM | POA: Diagnosis present

## 2018-10-28 DIAGNOSIS — N390 Urinary tract infection, site not specified: Secondary | ICD-10-CM | POA: Diagnosis present

## 2018-10-28 DIAGNOSIS — K219 Gastro-esophageal reflux disease without esophagitis: Secondary | ICD-10-CM | POA: Diagnosis present

## 2018-10-28 DIAGNOSIS — D45 Polycythemia vera: Secondary | ICD-10-CM | POA: Diagnosis present

## 2018-10-28 DIAGNOSIS — Z87440 Personal history of urinary (tract) infections: Secondary | ICD-10-CM

## 2018-10-28 DIAGNOSIS — Z79899 Other long term (current) drug therapy: Secondary | ICD-10-CM | POA: Diagnosis not present

## 2018-10-28 DIAGNOSIS — B964 Proteus (mirabilis) (morganii) as the cause of diseases classified elsewhere: Secondary | ICD-10-CM | POA: Diagnosis present

## 2018-10-28 DIAGNOSIS — G9341 Metabolic encephalopathy: Secondary | ICD-10-CM | POA: Diagnosis present

## 2018-10-28 DIAGNOSIS — Z825 Family history of asthma and other chronic lower respiratory diseases: Secondary | ICD-10-CM

## 2018-10-28 DIAGNOSIS — Z86718 Personal history of other venous thrombosis and embolism: Secondary | ICD-10-CM | POA: Diagnosis not present

## 2018-10-28 DIAGNOSIS — Z933 Colostomy status: Secondary | ICD-10-CM

## 2018-10-28 DIAGNOSIS — Z8673 Personal history of transient ischemic attack (TIA), and cerebral infarction without residual deficits: Secondary | ICD-10-CM

## 2018-10-28 DIAGNOSIS — E119 Type 2 diabetes mellitus without complications: Secondary | ICD-10-CM | POA: Diagnosis present

## 2018-10-28 DIAGNOSIS — Z7901 Long term (current) use of anticoagulants: Secondary | ICD-10-CM

## 2018-10-28 DIAGNOSIS — Z66 Do not resuscitate: Secondary | ICD-10-CM | POA: Diagnosis present

## 2018-10-28 DIAGNOSIS — R4182 Altered mental status, unspecified: Secondary | ICD-10-CM

## 2018-10-28 DIAGNOSIS — Z86711 Personal history of pulmonary embolism: Secondary | ICD-10-CM

## 2018-10-28 DIAGNOSIS — Z95828 Presence of other vascular implants and grafts: Secondary | ICD-10-CM | POA: Diagnosis not present

## 2018-10-28 DIAGNOSIS — Z96643 Presence of artificial hip joint, bilateral: Secondary | ICD-10-CM | POA: Diagnosis present

## 2018-10-28 DIAGNOSIS — I10 Essential (primary) hypertension: Secondary | ICD-10-CM | POA: Diagnosis present

## 2018-10-28 LAB — URINALYSIS, COMPLETE (UACMP) WITH MICROSCOPIC
Bilirubin Urine: NEGATIVE
GLUCOSE, UA: NEGATIVE mg/dL
KETONES UR: NEGATIVE mg/dL
NITRITE: POSITIVE — AB
PH: 6 (ref 5.0–8.0)
Protein, ur: 30 mg/dL — AB
Specific Gravity, Urine: 1.017 (ref 1.005–1.030)

## 2018-10-28 LAB — COMPREHENSIVE METABOLIC PANEL
ALK PHOS: 59 U/L (ref 38–126)
ALT: 8 U/L (ref 0–44)
ANION GAP: 3 — AB (ref 5–15)
AST: 17 U/L (ref 15–41)
Albumin: 3 g/dL — ABNORMAL LOW (ref 3.5–5.0)
BILIRUBIN TOTAL: 1 mg/dL (ref 0.3–1.2)
BUN: 11 mg/dL (ref 8–23)
CALCIUM: 8.6 mg/dL — AB (ref 8.9–10.3)
CO2: 32 mmol/L (ref 22–32)
CREATININE: 0.52 mg/dL (ref 0.44–1.00)
Chloride: 105 mmol/L (ref 98–111)
Glucose, Bld: 98 mg/dL (ref 70–99)
Potassium: 3.8 mmol/L (ref 3.5–5.1)
SODIUM: 140 mmol/L (ref 135–145)
TOTAL PROTEIN: 6.8 g/dL (ref 6.5–8.1)

## 2018-10-28 LAB — CBC WITH DIFFERENTIAL/PLATELET
Abs Immature Granulocytes: 1.02 10*3/uL — ABNORMAL HIGH (ref 0.00–0.07)
BASOS ABS: 0 10*3/uL (ref 0.0–0.1)
Basophils Relative: 0 %
EOS ABS: 0 10*3/uL (ref 0.0–0.5)
Eosinophils Relative: 1 %
HEMATOCRIT: 28.8 % — AB (ref 36.0–46.0)
HEMOGLOBIN: 8.6 g/dL — AB (ref 12.0–15.0)
Immature Granulocytes: 11 %
LYMPHS ABS: 1.2 10*3/uL (ref 0.7–4.0)
LYMPHS PCT: 12 %
MCH: 24.6 pg — AB (ref 26.0–34.0)
MCHC: 29.9 g/dL — ABNORMAL LOW (ref 30.0–36.0)
MCV: 82.3 fL (ref 80.0–100.0)
Monocytes Absolute: 0.6 10*3/uL (ref 0.1–1.0)
Monocytes Relative: 5 %
NEUTROS PCT: 71 %
NRBC: 3.8 % — AB (ref 0.0–0.2)
Neutro Abs: 6.8 10*3/uL (ref 1.7–7.7)
Platelets: 244 10*3/uL (ref 150–400)
RBC: 3.5 MIL/uL — AB (ref 3.87–5.11)
RDW: 31.6 % — ABNORMAL HIGH (ref 11.5–15.5)
WBC: 9.6 10*3/uL (ref 4.0–10.5)

## 2018-10-28 LAB — BRAIN NATRIURETIC PEPTIDE: B Natriuretic Peptide: 80 pg/mL (ref 0.0–100.0)

## 2018-10-28 LAB — GLUCOSE, CAPILLARY
Glucose-Capillary: 125 mg/dL — ABNORMAL HIGH (ref 70–99)
Glucose-Capillary: 143 mg/dL — ABNORMAL HIGH (ref 70–99)
Glucose-Capillary: 169 mg/dL — ABNORMAL HIGH (ref 70–99)

## 2018-10-28 LAB — TROPONIN I: TROPONIN I: 0.04 ng/mL — AB (ref <0.03)

## 2018-10-28 MED ORDER — RUXOLITINIB PHOSPHATE 15 MG PO TABS
15.0000 mg | ORAL_TABLET | Freq: Two times a day (BID) | ORAL | Status: DC
  Administered 2018-10-28 – 2018-10-31 (×6): 15 mg via ORAL
  Filled 2018-10-28 (×6): qty 1

## 2018-10-28 MED ORDER — ACETAMINOPHEN 325 MG PO TABS
650.0000 mg | ORAL_TABLET | Freq: Four times a day (QID) | ORAL | Status: DC | PRN
  Administered 2018-10-30: 650 mg via ORAL
  Filled 2018-10-28: qty 2

## 2018-10-28 MED ORDER — SODIUM CHLORIDE 0.9 % IV SOLN
1.0000 g | Freq: Once | INTRAVENOUS | Status: AC
  Administered 2018-10-28: 1 g via INTRAVENOUS
  Filled 2018-10-28: qty 10

## 2018-10-28 MED ORDER — GUAIFENESIN ER 600 MG PO TB12
600.0000 mg | ORAL_TABLET | Freq: Two times a day (BID) | ORAL | Status: DC
  Administered 2018-10-28 – 2018-10-31 (×6): 600 mg via ORAL
  Filled 2018-10-28 (×6): qty 1

## 2018-10-28 MED ORDER — ONDANSETRON HCL 4 MG PO TABS: 4.0000 mg | ORAL_TABLET | Freq: Four times a day (QID) | ORAL | Status: DC | PRN

## 2018-10-28 MED ORDER — ONDANSETRON HCL 4 MG/2ML IJ SOLN: 4.0000 mg | Freq: Four times a day (QID) | INTRAMUSCULAR | Status: DC | PRN

## 2018-10-28 MED ORDER — VITAMIN D 25 MCG (1000 UNIT) PO TABS
1000.0000 [IU] | ORAL_TABLET | Freq: Every day | ORAL | Status: DC
  Administered 2018-10-28 – 2018-10-31 (×4): 1000 [IU] via ORAL
  Filled 2018-10-28 (×4): qty 1

## 2018-10-28 MED ORDER — AMLODIPINE BESYLATE 5 MG PO TABS
5.0000 mg | ORAL_TABLET | Freq: Every day | ORAL | Status: DC
  Administered 2018-10-28 – 2018-10-31 (×4): 5 mg via ORAL
  Filled 2018-10-28 (×4): qty 1

## 2018-10-28 MED ORDER — SODIUM CHLORIDE 0.9 % IV SOLN
250.0000 mL | INTRAVENOUS | Status: DC | PRN
  Administered 2018-10-29 – 2018-10-30 (×2): 250 mL via INTRAVENOUS

## 2018-10-28 MED ORDER — ACETAMINOPHEN 650 MG RE SUPP: 650.0000 mg | Freq: Four times a day (QID) | RECTAL | Status: DC | PRN

## 2018-10-28 MED ORDER — INSULIN ASPART 100 UNIT/ML ~~LOC~~ SOLN
0.0000 [IU] | Freq: Three times a day (TID) | SUBCUTANEOUS | Status: DC
  Administered 2018-10-28 – 2018-10-29 (×4): 1 [IU] via SUBCUTANEOUS
  Administered 2018-10-29: 17:00:00 2 [IU] via SUBCUTANEOUS
  Administered 2018-10-30: 1 [IU] via SUBCUTANEOUS
  Filled 2018-10-28 (×5): qty 1

## 2018-10-28 MED ORDER — SODIUM CHLORIDE 0.9 % IV SOLN
1.0000 g | INTRAVENOUS | Status: DC
  Administered 2018-10-29 – 2018-10-30 (×2): 1 g via INTRAVENOUS
  Filled 2018-10-28 (×2): qty 1
  Filled 2018-10-28 (×2): qty 10

## 2018-10-28 MED ORDER — SODIUM CHLORIDE 0.9% FLUSH: 3.0000 mL | INTRAVENOUS | Status: DC | PRN

## 2018-10-28 MED ORDER — METHYLPREDNISOLONE SODIUM SUCC 125 MG IJ SOLR
60.0000 mg | Freq: Two times a day (BID) | INTRAMUSCULAR | Status: DC
  Administered 2018-10-28 – 2018-10-29 (×3): 60 mg via INTRAVENOUS
  Filled 2018-10-28 (×3): qty 2

## 2018-10-28 MED ORDER — FOLIC ACID 1 MG PO TABS
500.0000 ug | ORAL_TABLET | Freq: Every day | ORAL | Status: DC
  Administered 2018-10-28 – 2018-10-31 (×4): 0.5 mg via ORAL
  Filled 2018-10-28 (×4): qty 1

## 2018-10-28 MED ORDER — SODIUM CHLORIDE 0.9% FLUSH
3.0000 mL | Freq: Two times a day (BID) | INTRAVENOUS | Status: DC
  Administered 2018-10-28 – 2018-10-31 (×7): 3 mL via INTRAVENOUS

## 2018-10-28 MED ORDER — APIXABAN 5 MG PO TABS
5.0000 mg | ORAL_TABLET | Freq: Two times a day (BID) | ORAL | Status: DC
  Administered 2018-10-28 – 2018-10-31 (×6): 5 mg via ORAL
  Filled 2018-10-28 (×6): qty 1

## 2018-10-28 MED ORDER — IPRATROPIUM-ALBUTEROL 0.5-2.5 (3) MG/3ML IN SOLN
3.0000 mL | RESPIRATORY_TRACT | Status: DC
  Administered 2018-10-28 – 2018-10-29 (×4): 3 mL via RESPIRATORY_TRACT
  Filled 2018-10-28 (×4): qty 3

## 2018-10-28 MED ORDER — PANTOPRAZOLE SODIUM 40 MG PO TBEC
40.0000 mg | DELAYED_RELEASE_TABLET | Freq: Every day | ORAL | Status: DC
  Administered 2018-10-28 – 2018-10-31 (×4): 40 mg via ORAL
  Filled 2018-10-28 (×4): qty 1

## 2018-10-28 NOTE — H&P (Signed)
[place] [facility] [place] NAME: Yolanda Wells    MR#:  [id number]  DATE OF BIRTH:  October 07, 1932  DATE OF ADMISSION:  10/28/2018  PRIMARY CARE PHYSICIAN: [name], MD   REQUESTING/REFERRING PHYSICIAN: Orbie Pyo, MD  CHIEF COMPLAINT:   Chief Complaint  Patient presents with  . Shortness of Breath    HISTORY OF PRESENT ILLNESS: Yolanda Wells  is a 84 y.o. female with a known history of recurrent admissions to the hospital for similar type of presentation.  Patient has a history of DVT in the past as well as pulmonary embolism in the past was on chronic Eliquis therapy.  Also uses only oxygen at nighttime.  Who presents to the emergency room with worsening shortness of breath as well as altered mental status.  Patient on my evaluation her mental status is back to baseline.  She also was noted to have a UTI with abnormal UA as during her previous admissions.     PAST MEDICAL HISTORY:   Past Medical History:  Diagnosis Date  . Acute deep vein thrombosis (DVT) of distal vein of left lower extremity ([place]) 10/03/2015  . Acute pulmonary embolism ([place]) 10/03/2015  . Anticoagulant long-term use   . Arthritis   . Arthritis   . CHF (congestive heart failure) ([place])   . Chronic anticoagulation 10/03/2015  . Collagen vascular disease ([place])   . Colostomy in place [facility]) 10/04/2015  . Deep venous thrombosis ([facility])    right lower extremity  . Dependent edema   . Diverticulosis of intestine with bleeding 04/02/2015  . Glenohumeral arthritis 06/28/2012  . Hammertoe 09/02/2012  . History of bilateral hip replacements 09/02/2012  . History of hysterectomy   . Hypertension   . Hypertension   . Hypokalemia   . Inability to ambulate due to hip 10/12/2015  . Mandibular dysfunction   . Onychomycosis 09/02/2012  . Osteoarthritis   . Polycythemia vera ([place])   . Presence of IVC filter 05/25/2015  . Stroke ([place]) 03/26/2015   cerebellar  .  Urinary incontinence in female 09/02/2016    PAST SURGICAL HISTORY:  Past Surgical History:  Procedure Laterality Date  . ABDOMINAL HYSTERECTOMY    . BLEPHAROPLASTY Right 03/2017   right upper eyelid  . CARDIAC CATHETERIZATION Right 04/03/2015   Procedure: CENTRAL LINE INSERTION;  Surgeon: Sherri Rad, MD;  Location: [facility] ORS;  Service: General;  Laterality: Right;  . COLECTOMY WITH COLOSTOMY CREATION/HARTMANN PROCEDURE N/A 04/03/2015   Procedure: COLECTOMY WITH COLOSTOMY CREATION/HARTMANN PROCEDURE;  Surgeon: Sherri Rad, MD;  Location: [facility] ORS;  Service: General;  Laterality: N/A;  . COLON SURGERY    . FOOT AMPUTATION     partial  . FOOT AMPUTATION Right   . IVC FILTER INSERTION    . JOINT REPLACEMENT     Left and Right Hip  . PERIPHERAL VASCULAR CATHETERIZATION N/A 04/02/2015   Procedure: Visceral Angiography;  Surgeon: Algernon Huxley, MD;  Location: [place] CV LAB;  Service: Cardiovascular;  Laterality: N/A;  . PERIPHERAL VASCULAR CATHETERIZATION N/A 04/02/2015   Procedure: Visceral Artery Intervention;  Surgeon: Algernon Huxley, MD;  Location: [place] CV LAB;  Service: Cardiovascular;  Laterality: N/A;  . PERIPHERAL VASCULAR CATHETERIZATION N/A 05/25/2015   Procedure: IVC Filter Insertion;  Surgeon: Katha Cabal, MD;  Location: [place] CV [facility];  Service: Cardiovascular;  Laterality: N/A;  . TOE AMPUTATION     right  . TOTAL HIP ARTHROPLASTY  SOCIAL HISTORY:  Social History   Tobacco Use  . Smoking status: Never Smoker  . Smokeless tobacco: Never Used  Substance Use Topics  . Alcohol use: No    FAMILY HISTORY:  Family History  Problem Relation Age of Onset  . Brain cancer Mother   . Heart attack Father   . COPD Father   . Coronary artery disease Father   . Hypertension Other   . Arthritis-Osteo Other   . Diabetes Brother   . Arthritis Brother   . Osteosarcoma Son   . Bone cancer Son   . Arthritis Sister   . Arthritis Brother   . Hypertension  Brother   . Coronary artery disease Brother   . Malignant hyperthermia Brother     DRUG ALLERGIES: No Known Allergies  REVIEW OF SYSTEMS:   CONSTITUTIONAL: No fever, fatigue or positive weakness.  EYES: No blurred or double vision.  EARS, NOSE, AND THROAT: No tinnitus or ear pain.  RESPIRATORY: No cough, positive shortness of breath, wheezing or hemoptysis.  CARDIOVASCULAR: No chest pain, orthopnea, edema.  GASTROINTESTINAL: No nausea, vomiting, diarrhea or abdominal pain.  GENITOURINARY: No dysuria, hematuria.  ENDOCRINE: No polyuria, nocturia,  HEMATOLOGY: No anemia, easy bruising or bleeding SKIN: No rash or lesion. MUSCULOSKELETAL: No joint pain or arthritis.   NEUROLOGIC: No tingling, numbness, weakness.  PSYCHIATRY: No anxiety or depression.   MEDICATIONS AT HOME:  Prior to Admission medications   Medication Sig Start Date End Date Taking? Authorizing Provider  amLODipine (NORVASC) 5 MG tablet Take 5 mg by mouth daily.   Yes [provider]  cholecalciferol (VITAMIN D) 1000 units tablet Take 1,000 Units by mouth daily.   Yes [provider]  ELIQUIS 5 MG TABS tablet TAKE ONE TABLET BY MOUTH TWICE A DAY Patient taking differently: Take 5 mg by mouth 2 (two) times daily.  07/12/18  Yes Lequita Asal, MD  estradiol (ESTRACE VAGINAL) 0.1 MG/GM vaginal cream Apply 0.5mg  (pea-sized amount)  just inside the vaginal introitus with a finger-tip on Monday, Wednesday and Friday nights. 08/09/18  Yes McGowan, Larene Beach A, PA-C  folic acid (FOLVITE) 572 MCG tablet Take 400 mcg by mouth daily.   Yes [provider]  ipratropium-albuterol (DUONEB) 0.5-2.5 (3) MG/3ML SOLN Take 3 mLs by nebulization every 4 (four) hours as needed (for shortness of breath/wheezing).    Yes [provider]  JAKAFI 15 MG tablet TAKE 1 TABLET (15 MG TOTAL) BY MOUTH 2 TIMES DAILY. Patient taking differently: Take 15 mg by mouth 2 (two) times daily.  09/06/18  Yes Karen Kitchens,  NP  pantoprazole (PROTONIX) 40 MG tablet Take 1 tablet (40 mg total) by mouth daily. 09/12/18  Yes [name], MD  ferrous sulfate 325 (65 FE) MG tablet Take 1 tablet (325 mg total) by mouth daily. Patient not taking: Reported on 10/28/2018 09/11/18 12/10/18  [name], MD  metoprolol tartrate (LOPRESSOR) 25 MG tablet Take 0.5 tablets (12.5 mg total) by mouth 2 (two) times daily. Patient not taking: Reported on 10/28/2018 09/01/18   Max Sane, MD  Morphine Sulfate (MORPHINE CONCENTRATE) 10 MG/0.5ML SOLN concentrated solution Take 0.25 mLs (5 mg total) by mouth every 4 (four) hours as needed for moderate pain, severe pain or shortness of breath. Patient not taking: Reported on 10/28/2018 09/30/18   [name], MD      PHYSICAL EXAMINATION:   VITAL SIGNS: Blood pressure 130/68, pulse 68, temperature (!) 97.5 F (36.4 C), temperature source Oral, resp. rate [name])  21, SpO2 100 %.  GENERAL:  84 y.o.-year-old patient lying in the bed with no acute distress.  EYES: Pupils equal, round, reactive to light and accommodation. No scleral icterus. Extraocular muscles intact.  HEENT: Head atraumatic, normocephalic. Oropharynx and nasopharynx clear.  NECK:  Supple, no jugular venous distention. No thyroid enlargement, no tenderness.  LUNGS: Normal breath sounds bilaterally, no wheezing, rales,rhonchi or crepitation. No use of accessory muscles of respiration.  CARDIOVASCULAR: S1, S2 normal. No murmurs, rubs, or gallops.  ABDOMEN: Soft, nontender, nondistended. Bowel sounds present. No organomegaly or mass.  EXTREMITIES: No pedal edema, cyanosis, or clubbing.  NEUROLOGIC: Cranial nerves II through XII are intact. Muscle strength 5/5 in all extremities. Sensation intact. Gait not checked.  PSYCHIATRIC: The patient is sleepy but able to answer all questions SKIN: No obvious rash, lesion, or ulcer.   LABORATORY PANEL:   CBC Recent Labs  Lab 10/28/18 0829  WBC 9.6  HGB 8.6*  HCT  28.8*  PLT 244  MCV 82.3  MCH 24.6*  MCHC 29.9*  RDW 31.6*  LYMPHSABS 1.2  MONOABS 0.6  EOSABS 0.0  BASOSABS 0.0   ------------------------------------------------------------------------------------------------------------------  Chemistries  Recent Labs  Lab 10/28/18 0829  NA 140  K 3.8  CL 105  CO2 32  GLUCOSE 98  BUN 11  CREATININE 0.52  CALCIUM 8.6*  AST 17  ALT 8  ALKPHOS 59  BILITOT 1.0   ------------------------------------------------------------------------------------------------------------------ CrCl cannot be calculated (Unknown ideal weight.). ------------------------------------------------------------------------------------------------------------------ No results for input(s): TSH, T4TOTAL, T3FREE, THYROIDAB in the last 72 hours.  Invalid input(s): FREET3   Coagulation profile No results for input(s): INR, PROTIME in the last 168 hours. ------------------------------------------------------------------------------------------------------------------- No results for input(s): DDIMER in the last 72 hours. -------------------------------------------------------------------------------------------------------------------  Cardiac Enzymes Recent Labs  Lab 10/28/18 0829  TROPONINI 0.04*   ------------------------------------------------------------------------------------------------------------------ Invalid input(s): POCBNP  ---------------------------------------------------------------------------------------------------------------  Urinalysis    Component Value Date/Time   COLORURINE YELLOW (A) 10/28/2018 0829   APPEARANCEUR CLOUDY (A) 10/28/2018 0829   APPEARANCEUR Hazy 07/08/2014 0956   LABSPEC 1.017 10/28/2018 0829   LABSPEC 1.019 07/08/2014 0956   PHURINE 6.0 10/28/2018 0829   GLUCOSEU NEGATIVE 10/28/2018 0829   GLUCOSEU Negative 07/08/2014 0956   HGBUR SMALL (A) 10/28/2018 0829   BILIRUBINUR NEGATIVE 10/28/2018 0829    BILIRUBINUR Negative 07/08/2014 0956   KETONESUR NEGATIVE 10/28/2018 0829   PROTEINUR 30 (A) 10/28/2018 0829   NITRITE POSITIVE (A) 10/28/2018 0829   LEUKOCYTESUR LARGE (A) 10/28/2018 0829   LEUKOCYTESUR 1+ 07/08/2014 0956     RADIOLOGY: Dg Chest Portable 1 View  Result Date: 10/28/2018 CLINICAL DATA:  Altered mental status and shortness of breath EXAM: PORTABLE CHEST 1 VIEW COMPARISON:  September 27, 2018 FINDINGS: There is stable mild elevation of the right hemidiaphragm. There is no edema or consolidation. Heart is mildly enlarged with pulmonary vascularity normal. There is calcification in the mitral annulus. There is aortic atherosclerosis. No adenopathy. There is degenerative change in each shoulder. IMPRESSION: Stable cardiac prominence. No edema or consolidation. Mild elevation of the right hemidiaphragm is stable. There is aortic atherosclerosis. Aortic Atherosclerosis (ICD10-I70.0). Electronically Signed   By: Lowella Grip III M.D.   On: 10/28/2018 09:53    EKG: Orders placed or performed during the hospital encounter of 10/28/18  . ED EKG  . ED EKG  . EKG 12-Lead  . EKG 12-Lead    IMPRESSION AND PLAN: Patient is 84 year old with recurrent admissions to the hospital  1.  Shortness of breath suspect due to asthma exacerbation we  will treat with nebulizer therapy as well as IV Solu-Medrol  2.  Acute encephalopathy which is now improved could be due to #1 as well as possible UTI  3.  UTI follow urine cultures previous urine culture showed yeast.  I will treat with ceftriaxone for now  4.  Diabetes type 2 we will continue her home regimen Place on sliding scale insulin  5.  Essential hypertension continue therapy with Norvasc  6.  GERD we will continue Protonix  7.  Miscellaneous continue Eliquis for DVT prophylaxis     All the records are reviewed and case discussed with ED provider. Management plans discussed with the patient, family and they are in  agreement.  CODE STATUS: Code Status History    Date Active Date Inactive Code Status Order ID Comments User Context   09/27/2018 1901 09/30/2018 1545 DNR [id number]  Loletha Grayer, MD ED   09/22/2018 0133 09/25/2018 2201 Full Code [id number]  Amelia Jo, MD Inpatient   09/09/2018 1240 09/12/2018 2008 Full Code [id number]  Loletha Grayer, MD ED   08/30/2018 2018 09/01/2018 2004 DNR [id number]  Nicholes Mango, MD ED   08/30/2018 1903 08/30/2018 2018 Full Code [id number]  Nicholes Mango, MD ED   08/13/2018 1038 08/15/2018 1836 Full Code [id number]  Loletha Grayer, MD Inpatient   08/12/2018 2001 08/13/2018 1038 DNR [id number]  Gorden Harms, MD ED   08/12/2018 2001 08/12/2018 2001 Full Code [id number]  Salary, Avel Peace, MD ED   06/15/2018 1635 06/18/2018 2009 Full Code [id number]  [name], MD Inpatient   02/24/2018 0347 02/27/2018 0027 Full Code [id number]  Amelia Jo, MD Inpatient   10/15/2017 1141 10/16/2017 2204 Full Code [id number]  Knox Royalty, NP Inpatient   10/13/2017 2246 10/15/2017 1141 DNR [id number]  Lance Coon, MD ED   10/06/2017 1454 10/09/2017 2058 DNR [id number]  Nicholes Mango, MD Inpatient   10/06/2017 1111 10/06/2017 1453 Full Code [id number]  Nicholes Mango, MD Inpatient   05/27/2017 1954 05/28/2017 2013 Full Code [id number]  Vaughan Basta, MD Inpatient   05/23/2017 2322 05/26/2017 1614 Full Code [id number]  Lance Coon, MD Inpatient   04/10/2017 0325 04/11/2017 1916 Full Code [id number]  Harrie Foreman, MD Inpatient   11/23/2016 0629 12/01/2016 1935 Full Code [id number]  Harrie Foreman, MD Inpatient   08/26/2016 0445 08/26/2016 1923 Full Code [id number]  Saundra Shelling, MD Inpatient   09/22/2015 1755 09/25/2015 1844 Full Code [id number]  Demetrios Loll, MD ED   09/15/2015 0341 09/17/2015 1741 Full Code [id number]  Harrie Foreman, MD Inpatient   07/16/2015 0151 07/20/2015 2334 Full Code [id number]  Lance Coon, MD Inpatient   05/25/2015 0834 05/29/2015 1616 Full Code [id number]   Harrie Foreman, MD Inpatient   04/02/2015 0606 04/08/2015 1651 Full Code [id number]  Harrie Foreman, MD Inpatient    Questions for Most Recent Historical Code Status (Order [id number])    Question Answer Comment   In the event of cardiac or respiratory ARREST Do not call a "code blue"    In the event of cardiac or respiratory ARREST Do not perform Intubation, CPR, defibrillation or ACLS    In the event of cardiac or respiratory ARREST Use medication by any route, position, wound care, and other measures to relive pain and suffering. May use oxygen, suction and manual treatment of airway obstruction as needed for comfort.    Comments nurse may pronounce        TOTAL TIME TAKING CARE OF THIS PATIENT: 55 minutes.  Dustin Flock M.D on 10/28/2018 at 11:02 AM  Between 7am to 6pm - Pager - [phone]  After 6pm go to [email or web address] - password [email or web address] [facility]  [phone]  CC: Primary care physician; [name], MD

## 2018-10-28 NOTE — ED Notes (Signed)
Per pt granddaughter/care giver. Pt was more confused with increased SOB since last night. States this morning when she changed her, her urine had a strong odor to it. Pt is alert and oriented to self and situation on arrival

## 2018-10-28 NOTE — ED Notes (Signed)
States the pt was up every hour needing to be repositioned to assist with SOB during the night. States this morning her sats were 86% on 3L [place] at home.[name]

## 2018-10-28 NOTE — Plan of Care (Signed)

## 2018-10-28 NOTE — ED Triage Notes (Addendum)
Pt to ED via EMS from home with c/o AMS and increased SOB since yesterday. EMS on 2L  chronic. VSS . MD at bedside . A&O to self and place

## 2018-10-28 NOTE — ED Notes (Signed)
Date and time results received: 10/28/18 0925 (use smartphrase ".now" to insert current time)  Test: Troponin Critical Value: 0.04  Name of Provider Notified: [place]  Orders Received? Or Actions Taken?: Actions Taken: No new orders

## 2018-10-28 NOTE — Progress Notes (Signed)
Advanced care plan.  Purpose of the Encounter: CODE STATUS  Parties in Attendance: Patient herself  Patient's Decision Capacity: Intact  Subjective/Patient's story:  Patient is a 84 year old African-American female with known history of pulmonary embolism, diabetes type 2, hypertension who is presenting with shortness of breath  Objective/Medical story  I discussed and confirmed with patient's desires for cardiac and pulmonary resuscitation patient has a previous DNR in place  Goals of care determination:  Patient states that she would not want to be resuscitated continue DNR status   CODE STATUS: DNR   Time spent discussing advanced care planning: 16 minutes

## 2018-10-28 NOTE — ED Notes (Signed)
Attempted to call report

## 2018-10-28 NOTE — ED Notes (Signed)
ED TO INPATIENT HANDOFF REPORT  Name/Age/Gender Yolanda Wells 84 y.o. female  Code Status Code Status History    Date Active Date Inactive Code Status Order ID Comments User Context   09/27/2018 1901 09/30/2018 1545 DNR [id number]  Loletha Grayer, MD ED   09/22/2018 0133 09/25/2018 2201 Full Code [id number]  Amelia Jo, MD Inpatient   09/09/2018 1240 09/12/2018 2008 Full Code [id number]  Loletha Grayer, MD ED   08/30/2018 2018 09/01/2018 2004 DNR [id number]  Nicholes Mango, MD ED   08/30/2018 1903 08/30/2018 2018 Full Code [id number]  Nicholes Mango, MD ED   08/13/2018 1038 08/15/2018 1836 Full Code [id number]  Loletha Grayer, MD Inpatient   08/12/2018 2001 08/13/2018 1038 DNR [id number]  Gorden Harms, MD ED   08/12/2018 2001 08/12/2018 2001 Full Code [id number]  Salary, Avel Peace, MD ED   06/15/2018 1635 06/18/2018 2009 Full Code [id number]  [name], MD Inpatient   02/24/2018 0347 02/27/2018 0027 Full Code [id number]  Amelia Jo, MD Inpatient   10/15/2017 1141 10/16/2017 2204 Full Code [id number]  Knox Royalty, NP Inpatient   10/13/2017 2246 10/15/2017 1141 DNR [id number]  Lance Coon, MD ED   10/06/2017 1454 10/09/2017 2058 DNR [id number]  Nicholes Mango, MD Inpatient   10/06/2017 1111 10/06/2017 1453 Full Code [id number]  Nicholes Mango, MD Inpatient   05/27/2017 1954 05/28/2017 2013 Full Code [id number]  Vaughan Basta, MD Inpatient   05/23/2017 2322 05/26/2017 1614 Full Code [id number]  Lance Coon, MD Inpatient   04/10/2017 0325 04/11/2017 1916 Full Code [id number]  Harrie Foreman, MD Inpatient   11/23/2016 0629 12/01/2016 1935 Full Code [id number]  Harrie Foreman, MD Inpatient   08/26/2016 0445 08/26/2016 1923 Full Code [id number]  Saundra Shelling, MD Inpatient   09/22/2015 1755 09/25/2015 1844 Full Code [id number]  Demetrios Loll, MD ED   09/15/2015 0341 09/17/2015 1741 Full Code [id number]  Harrie Foreman, MD Inpatient   07/16/2015 0151 07/20/2015 2334 Full Code [id number]  Lance Coon,  MD Inpatient   05/25/2015 0834 05/29/2015 1616 Full Code [id number]  Harrie Foreman, MD Inpatient   04/02/2015 0606 04/08/2015 1651 Full Code [id number]  Harrie Foreman, MD Inpatient    Questions for Most Recent Historical Code Status (Order [id number])    Question Answer Comment   In the event of cardiac or respiratory ARREST Do not call a "code blue"    In the event of cardiac or respiratory ARREST Do not perform Intubation, CPR, defibrillation or ACLS    In the event of cardiac or respiratory ARREST Use medication by any route, position, wound care, and other measures to relive pain and suffering. May use oxygen, suction and manual treatment of airway obstruction as needed for comfort.    Comments nurse may pronounce       Home/SNF/Other Home  Chief Complaint shortness of breath  Level of Care/Admitting Diagnosis ED Disposition    ED Disposition Condition [place]: [place] [[id number]]  Level of Care: Med-Surg [16]  Diagnosis: SOB (shortness of breath) [915056]  Admitting Physician: Dustin Flock [[id number]]  Attending Physician: Dustin Flock [[id number]]  Estimated length of stay: past midnight tomorrow  Certification:: I certify this patient will need inpatient services for at least 2 midnights  PT Class (Do Not Modify): Inpatient [101]  PT Acc Code (Do Not Modify): Private [1]       Medical History Past Medical History:  Diagnosis Date  . Acute deep vein thrombosis (DVT) of distal  vein of left lower extremity ([place]) 10/03/2015  . Acute pulmonary embolism ([place]) 10/03/2015  . Anticoagulant long-term use   . Arthritis   . Arthritis   . CHF (congestive heart failure) ([place])   . Chronic anticoagulation 10/03/2015  . Collagen vascular disease ([place])   . Colostomy in place [facility]) 10/04/2015  . Deep venous thrombosis ([facility])    right lower extremity  . Dependent edema   . Diverticulosis of intestine with bleeding 04/02/2015  .  Glenohumeral arthritis 06/28/2012  . Hammertoe 09/02/2012  . History of bilateral hip replacements 09/02/2012  . History of hysterectomy   . Hypertension   . Hypertension   . Hypokalemia   . Inability to ambulate due to hip 10/12/2015  . Mandibular dysfunction   . Onychomycosis 09/02/2012  . Osteoarthritis   . Polycythemia vera ([place])   . Presence of IVC filter 05/25/2015  . 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 Urinary incontinence in female 09/02/2016    Allergies No Known Allergies  IV Location/Drains/Wounds Patient Lines/Drains/Airways Status   Active Line/Drains/Airways    Name:   Placement date:   Placement time:   Site:   Days:   Peripheral IV 10/28/18 Left Wrist   10/28/18    0839    Wrist   less than 1   Colostomy LLQ   04/03/15    1256    LLQ   1304   Colostomy LLQ   06/15/18    1648    LLQ   135   Pressure Injury 08/13/18 Unstageable - Full thickness tissue loss in which the base of the ulcer is covered by slough (yellow, tan, gray, green or brown) and/or eschar (tan, brown or black) in the wound bed. 1 cm x 1 cm to left buttock. Area dark. Unable   08/13/18    0000     76   Pressure Injury 08/13/18 Unstageable - Full thickness tissue loss in which the base of the ulcer is covered by slough (yellow, tan, gray, green or brown) and/or eschar (tan, brown or black) in the wound bed. 0.5 cm x 3 cm to dark area to coccyx.   08/13/18    0000     76   Wound / Incision (Open or Dehisced) 09/27/18 Buttocks Left   09/27/18    2025    Buttocks   31          Labs/Imaging Results for orders placed or performed during the hospital encounter of 10/28/18 (from the past 48 hour(s))  CBC with Differential     Status: Abnormal   Collection Time: 10/28/18  8:29 AM  Result Value Ref Range   WBC 9.6 4.0 - 10.5 K/uL   RBC 3.50 (L) 3.87 - 5.11 MIL/uL    Comment: RESULTS CONFIRMED BY MANUAL DILUTION   Hemoglobin 8.6 (L) 12.0 - 15.0 g/dL   HCT 28.8 (L) 36.0 - 46.0 %   MCV 82.3 80.0 -  100.0 fL   MCH 24.6 (L) 26.0 - 34.0 pg   MCHC 29.9 (L) 30.0 - 36.0 g/dL   RDW 31.6 (H) 11.5 - 15.5 %   Platelets 244 150 - 400 K/uL   nRBC 3.8 (H) 0.0 - 0.2 %   Neutrophils Relative % 71 %   Neutro Abs 6.8 1.7 - 7.7 K/uL   Lymphocytes Relative 12 %   Lymphs Abs 1.2 0.7 - 4.0 K/uL   Monocytes Relative 5 %   Monocytes Absolute 0.6 0.1 - 1.0 K/uL  Eosinophils Relative 1 %   Eosinophils Absolute 0.0 0.0 - 0.5 K/uL   Basophils Relative 0 %   Basophils Absolute 0.0 0.0 - 0.1 K/uL   WBC Morphology HYPERSEGMENTED NEUT    Immature Granulocytes 11 %   Abs Immature Granulocytes 1.02 (H) 0.00 - 0.07 K/uL   Schistocytes PRESENT    Tear Drop Cells PRESENT    Polychromasia PRESENT    Ovalocytes PRESENT     Comment: Performed at [facility], [address] [place], [place] [address]  Comprehensive metabolic panel     Status: Abnormal   Collection Time: 10/28/18  8:29 AM  Result Value Ref Range   Sodium 140 135 - 145 mmol/L   Potassium 3.8 3.5 - 5.1 mmol/L   Chloride 105 98 - 111 mmol/L   CO2 32 22 - 32 mmol/L   Glucose, Bld 98 70 - 99 mg/dL   BUN 11 8 - 23 mg/dL   Creatinine, Ser 0.52 0.44 - 1.00 mg/dL   Calcium 8.6 (L) 8.9 - 10.3 mg/dL   Total Protein 6.8 6.5 - 8.1 g/dL   Albumin 3.0 (L) 3.5 - 5.0 g/dL   AST 17 15 - 41 U/L   ALT 8 0 - 44 U/L   Alkaline Phosphatase 59 38 - 126 U/L   Total Bilirubin 1.0 0.3 - 1.2 mg/dL   GFR calc non Af Amer >60 >60 mL/min   GFR calc Af Amer >60 >60 mL/min   Anion gap 3 (L) 5 - 15    Comment: Performed at [facility], [address] [place], [place] [address]  Troponin I - ONCE - STAT     Status: Abnormal   Collection Time: 10/28/18  8:29 AM  Result Value Ref Range   Troponin I 0.04 (HH) <0.03 ng/mL    Comment: CRITICAL RESULT CALLED TO, READ BACK BY AND VERIFIED WITH STEPHANIE Marcos Ruelas @0921  10/28/18 BY FMW Performed at [facility], [address] [place], [place] [address]   Urinalysis, Complete w Microscopic      Status: Abnormal   Collection Time: 10/28/18  8:29 AM  Result Value Ref Range   Color, Urine YELLOW (A) YELLOW   APPearance CLOUDY (A) CLEAR   Specific Gravity, Urine 1.017 1.005 - 1.030   pH 6.0 5.0 - 8.0   Glucose, UA NEGATIVE NEGATIVE mg/dL   Hgb urine dipstick SMALL (A) NEGATIVE   Bilirubin Urine NEGATIVE NEGATIVE   Ketones, ur NEGATIVE NEGATIVE mg/dL   Protein, ur 30 (A) NEGATIVE mg/dL   Nitrite POSITIVE (A) NEGATIVE   Leukocytes, UA LARGE (A) NEGATIVE   RBC / HPF 11-20 0 - 5 RBC/hpf   WBC, UA >50 (H) 0 - 5 WBC/hpf   Bacteria, UA RARE (A) NONE SEEN   Squamous Epithelial / LPF 0-5 0 - 5   WBC Clumps PRESENT    Mucus PRESENT     Comment: Performed at [facility], [address] [place], [place] [address]  Brain natriuretic peptide     Status: None   Collection Time: 10/28/18  8:29 AM  Result Value Ref Range   B Natriuretic Peptide 80.0 0.0 - 100.0 pg/mL    Comment: Performed at [facility], [address] [place], [place] [address]  Glucose, capillary     Status: Abnormal   Collection Time: 10/28/18 12:20 PM  Result Value Ref Range   Glucose-Capillary 125 (H) 70 - 99 mg/dL   Dg Chest Portable 1 View  Result Date:  10/28/2018 CLINICAL DATA:  Altered mental status and shortness of breath EXAM: PORTABLE CHEST 1 VIEW COMPARISON:  September 27, 2018 FINDINGS: There is stable mild elevation of the right hemidiaphragm. There is no edema or consolidation. Heart is mildly enlarged with pulmonary vascularity normal. There is calcification in the mitral annulus. There is aortic atherosclerosis. No adenopathy. There is degenerative change in each shoulder. IMPRESSION: Stable cardiac prominence. No edema or consolidation. Mild elevation of the right hemidiaphragm is stable. There is aortic atherosclerosis. Aortic Atherosclerosis (ICD10-I70.0). Electronically Signed   By: Lowella Grip III M.D.   On: 10/28/2018 09:53    Pending Labs Unresulted Labs (From  admission, onward)    Start     Ordered   10/28/18 0829  Urine Culture  Add-on,   AD     10/28/18 7673   Signed and Held  CBC  Tomorrow morning,   R     Signed and Held   Signed and Held  Basic metabolic panel  Tomorrow morning,   R     Signed and Held          Vitals/Pain Today's Vitals   10/28/18 1130 10/28/18 1145 10/28/18 1200 10/28/18 1215  BP: (Abnormal) 141/72  (Abnormal) 143/78   Pulse: 66 66 67 (Abnormal) 145  Resp: (Abnormal) 23 (Abnormal) 22 18 (Abnormal) 29  Temp:      TempSrc:      SpO2: 100% 100% 100% 100%  PainSc:        Isolation Precautions No active isolations  Medications Medications  insulin aspart (novoLOG) injection 0-9 Units (1 Units Subcutaneous Given 10/28/18 1224)  cefTRIAXone (ROCEPHIN) 1 g in sodium chloride 0.9 % 100 mL IVPB (has no administration in time range)  ipratropium-albuterol (DUONEB) 0.5-2.5 (3) MG/3ML nebulizer solution 3 mL (3 mLs Nebulization Given 10/28/18 1221)  methylPREDNISolone sodium succinate (SOLU-MEDROL) 125 mg/2 mL injection 60 mg (60 mg Intravenous Given 10/28/18 1221)  cefTRIAXone (ROCEPHIN) 1 g in sodium chloride 0.9 % 100 mL IVPB (0 g Intravenous Stopped 10/28/18 1025)    Mobility non-ambulatory

## 2018-10-28 NOTE — ED Provider Notes (Signed)
[facility] Emergency Department Provider Note  ____________________________________________   First MD Initiated Contact with Patient 10/28/18 [phone]     (approximate)  I have reviewed the triage vital signs and the nursing notes.   HISTORY  Chief Complaint Shortness of Breath   HPI Yolanda Wells is a 84 y.o. female with a history of acute DVT, PE, CHF on 2 L of nasal cannula oxygen who was presented emergency department with worsening shortness of breath as well as altered mental status over the past 24 hours.  Exact onset of altered mental status is unknown.  No focal deficits noted by EMS.  Patient is usually on 2 L nasal cannula but has been increased to 3 L at home and 4 L in route.  Patient had a lowest oxygen saturation of 97% as reported by fire rescue.  Patient not complaining of any pain.  No fever reported.  Patient also recently admitted to the hospital for UTI and altered mental status.  EMS reports that the altered mental status includes increased confusion.  Patient thought that it was Christmas yesterday.  Past Medical History:  Diagnosis Date  . Acute deep vein thrombosis (DVT) of distal vein of left lower extremity ([place]) 10/03/2015  . Acute pulmonary embolism ([place]) 10/03/2015  . Anticoagulant long-term use   . Arthritis   . Arthritis   . CHF (congestive heart failure) ([place])   . Chronic anticoagulation 10/03/2015  . Collagen vascular disease ([place])   . Colostomy in place [facility]) 10/04/2015  . Deep venous thrombosis ([facility])    right lower extremity  . Dependent edema   . Diverticulosis of intestine with bleeding 04/02/2015  . Glenohumeral arthritis 06/28/2012  . Hammertoe 09/02/2012  . History of bilateral hip replacements 09/02/2012  . History of hysterectomy   . Hypertension   . Hypertension   . Hypokalemia   . Inability to ambulate due to hip 10/12/2015  . Mandibular dysfunction   . Onychomycosis 09/02/2012  . Osteoarthritis   .  Polycythemia vera ([place])   . Presence of IVC filter 05/25/2015  . 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 Urinary incontinence in female 09/02/2016    Patient Active Problem List   Diagnosis Date Noted  . Shortness of breath 09/27/2018  . COPD exacerbation ([place]) 09/22/2018  . Sepsis ([place]) 09/22/2018  . Lower GI bleed 09/09/2018  . AMS (altered mental status) 08/30/2018  . Pressure injury of skin 08/13/2018  . B12 deficiency 08/04/2018  . Anemia 06/07/2018  . Secondary myelofibrosis ([place]) 02/17/2018  . Goals of care, counseling/discussion 12/23/2017  . Palliative care by specialist   . DNR (do not resuscitate) discussion   . Weakness generalized   . Chronic diastolic CHF (congestive heart failure) ([place]) 07/28/2017  . Vascular dementia without behavioral disturbance ([place]) 07/08/2017  . Influenza with respiratory manifestation 12/01/2016  . Respiratory distress 11/23/2016  . Urinary incontinence in female 09/02/2016  . Vision loss of right eye 08/26/2016  . Blindness of right eye 08/26/2016  . Inability to ambulate due to hip 10/12/2015  . Colostomy in place [facility] 10/04/2015  . Long term current use of anticoagulant therapy 10/03/2015  . Leukocytosis 09/25/2015  . CAP (community acquired pneumonia) 09/24/2015  . COPD (chronic obstructive pulmonary disease) ([place]) 09/24/2015  . UTI (urinary tract infection) 07/16/2015  . Abdominal wall cellulitis 07/15/2015  . DVT (deep venous thrombosis) ([place]) 07/15/2015  . HTN (hypertension) 07/15/2015  . Polycythemia vera ([place]) 07/15/2015  . Arthritis 07/15/2015  .  Pulmonary emboli ([place]) 05/25/2015  . Diverticulosis of colon with hemorrhage 04/02/2015  . Diverticulosis of intestine with bleeding 04/02/2015  . Fall 01/10/2015  . Osteoarthritis 01/10/2015  . Hammertoe 09/02/2012  . S/P transmetatarsal amputation of foot ([place]) 09/02/2012  . Onychomycosis 09/02/2012  . Other specified dermatoses 09/02/2012  . S/P hip replacement 08/23/2012    . Glenohumeral arthritis 06/28/2012    Past Surgical History:  Procedure Laterality Date  . ABDOMINAL HYSTERECTOMY    . BLEPHAROPLASTY Right 03/2017   right upper eyelid  . CARDIAC CATHETERIZATION Right 04/03/2015   Procedure: CENTRAL LINE INSERTION;  Surgeon: Sherri Rad, MD;  Location: [facility] ORS;  Service: General;  Laterality: Right;  . COLECTOMY WITH COLOSTOMY CREATION/HARTMANN PROCEDURE N/A 04/03/2015   Procedure: COLECTOMY WITH COLOSTOMY CREATION/HARTMANN PROCEDURE;  Surgeon: Sherri Rad, MD;  Location: [facility] ORS;  Service: General;  Laterality: N/A;  . COLON SURGERY    . FOOT AMPUTATION     partial  . FOOT AMPUTATION Right   . IVC FILTER INSERTION    . JOINT REPLACEMENT     Left and Right Hip  . PERIPHERAL VASCULAR CATHETERIZATION N/A 04/02/2015   Procedure: Visceral Angiography;  Surgeon: Algernon Huxley, MD;  Location: [place] [facility];  Service: Cardiovascular;  Laterality: N/A;  . PERIPHERAL VASCULAR CATHETERIZATION N/A 04/02/2015   Procedure: Visceral Artery Intervention;  Surgeon: Algernon Huxley, MD;  Location: [place] [facility];  Service: Cardiovascular;  Laterality: N/A;  . PERIPHERAL VASCULAR CATHETERIZATION N/A 05/25/2015   Procedure: IVC Filter Insertion;  Surgeon: Katha Cabal, MD;  Location: [place] [facility];  Service: Cardiovascular;  Laterality: N/A;  . TOE AMPUTATION     right  . TOTAL HIP ARTHROPLASTY      Prior to Admission medications   Medication Sig Start Date End Date Taking? Authorizing Provider  amLODipine (NORVASC) 5 MG tablet Take 5 mg by mouth daily.   Yes [provider]  cholecalciferol (VITAMIN D) 1000 units tablet Take 1,000 Units by mouth daily.   Yes [provider]  ELIQUIS 5 MG TABS tablet TAKE ONE TABLET BY MOUTH TWICE A DAY Patient taking differently: Take 5 mg by mouth 2 (two) times daily.  07/12/18  Yes Lequita Asal, MD  estradiol (ESTRACE VAGINAL) 0.1 MG/GM vaginal cream Apply 0.5mg  (pea-sized amount)  just  inside the vaginal introitus with a finger-tip on Monday, Wednesday and Friday nights. 08/09/18  Yes McGowan, Larene Beach A, PA-C  folic acid (FOLVITE) 850 MCG tablet Take 400 mcg by mouth daily.   Yes [provider]  ipratropium-albuterol (DUONEB) 0.5-2.5 (3) MG/3ML SOLN Take 3 mLs by nebulization every 4 (four) hours as needed (for shortness of breath/wheezing).    Yes [provider]  JAKAFI 15 MG tablet TAKE 1 TABLET (15 MG TOTAL) BY MOUTH 2 TIMES DAILY. Patient taking differently: Take 15 mg by mouth 2 (two) times daily.  09/06/18  Yes Karen Kitchens, NP  pantoprazole (PROTONIX) 40 MG tablet Take 1 tablet (40 mg total) by mouth daily. 09/12/18  Yes [name], MD  ferrous sulfate 325 (65 FE) MG tablet Take 1 tablet (325 mg total) by mouth daily. Patient not taking: Reported on 10/28/2018 09/11/18 12/10/18  [name], MD  metoprolol tartrate (LOPRESSOR) 25 MG tablet Take 0.5 tablets (12.5 mg total) by mouth 2 (two) times daily. Patient not taking: Reported on 10/28/2018 09/01/18   Max Sane, MD  Morphine Sulfate (MORPHINE CONCENTRATE) 10 MG/0.5ML SOLN concentrated solution Take 0.25 mLs (5  mg total) by mouth every 4 (four) hours as needed for moderate pain, severe pain or shortness of breath. Patient not taking: Reported on 10/28/2018 09/30/18   [name], MD    Allergies Patient has no known allergies.  Family History  Problem Relation Age of Onset  . Brain cancer Mother   . Heart attack Father   . COPD Father   . Coronary artery disease Father   . Hypertension Other   . Arthritis-Osteo Other   . Diabetes Brother   . Arthritis Brother   . Osteosarcoma Son   . Bone cancer Son   . Arthritis Sister   . Arthritis Brother   . Hypertension Brother   . Coronary artery disease Brother   . Malignant hyperthermia Brother     Social History Social History   Tobacco Use  . Smoking status: Never Smoker  . Smokeless tobacco: Never Used  Substance  Use Topics  . Alcohol use: No  . Drug use: No    Review of Systems  Constitutional: No fever/chills Eyes: No visual changes. ENT: No sore throat. Cardiovascular: Denies chest pain. Respiratory: As above Gastrointestinal: No abdominal pain.  No nausea, no vomiting.  No diarrhea.  No constipation. Genitourinary: Negative for dysuria. Musculoskeletal: Negative for back pain. Skin: Negative for rash. Neurological: Negative for headaches, focal weakness or numbness.   ____________________________________________   PHYSICAL EXAM:  VITAL SIGNS: ED Triage Vitals  Enc Vitals Group     BP --      Pulse Rate 10/28/18 0825 76     Resp 10/28/18 0825 18     Temp --      Temp src --      SpO2 10/28/18 0825 100 %     Weight --      Height --      Head Circumference --      Peak Flow --      Pain Score 10/28/18 0826 0     Pain Loc --      Pain Edu? --      Excl. in [place]? --     Constitutional: Alert and oriented x3.  Patient oriented to name, place as well as year.  Well appearing and in no acute distress. Eyes: Conjunctivae are normal.  Head: Atraumatic. Nose: No congestion/rhinnorhea. Mouth/Throat: Mucous membranes are moist.  Neck: No stridor.   Cardiovascular: Normal rate, regular rhythm. Grossly normal heart sounds.  Respiratory: Normal respiratory effort.  No retractions.  Rales to the bilateral lower lung fields.  No respiratory distress.  Speaking in full sentences. Gastrointestinal: Soft and nontender. No distention. No CVA tenderness.  Colostomy in place without any blood in the bag.  No surrounding erythema, induration or tenderness to palpation. Musculoskeletal: No lower extremity tenderness nor edema.  No joint effusions. Neurologic:  Normal speech and language. No gross focal neurologic deficits are appreciated. Skin:  Skin is warm, dry and intact. No rash noted. Psychiatric: Mood and affect are normal. Speech and behavior are  normal.  ____________________________________________   LABS (all labs ordered are listed, but only abnormal results are displayed)  Labs Reviewed  CBC WITH DIFFERENTIAL/PLATELET - Abnormal; Notable for the following components:      Result Value   RBC 3.50 (*)    Hemoglobin 8.6 (*)    HCT 28.8 (*)    MCH 24.6 (*)    MCHC 29.9 (*)    RDW 31.6 (*)    nRBC 3.8 (*)    Abs Immature Granulocytes  1.02 (*)    All other components within normal limits  COMPREHENSIVE METABOLIC PANEL - Abnormal; Notable for the following components:   Calcium 8.6 (*)    Albumin 3.0 (*)    Anion gap 3 (*)    All other components within normal limits  TROPONIN I - Abnormal; Notable for the following components:   Troponin I 0.04 (*)    All other components within normal limits  URINALYSIS, COMPLETE (UACMP) WITH MICROSCOPIC - Abnormal; Notable for the following components:   Color, Urine YELLOW (*)    APPearance CLOUDY (*)    Hgb urine dipstick SMALL (*)    Protein, ur 30 (*)    Nitrite POSITIVE (*)    Leukocytes, UA LARGE (*)    WBC, UA >50 (*)    Bacteria, UA RARE (*)    All other components within normal limits  URINE CULTURE  BRAIN NATRIURETIC PEPTIDE   ____________________________________________  EKG ED ECG REPORT I, Doran Stabler, the attending physician, personally viewed and interpreted this ECG.   Date: 10/28/2018  EKG Time: 0828  Rate: 71  Rhythm: normal sinus rhythm  Axis: Normal  Intervals:LVH  ST&T Change: No ST segment elevation or depression.  No abnormal T wave inversion.  ____________________________________________  RADIOLOGY  Chest x-ray with stable cardiac prominence.  No edema or consolidation. ____________________________________________   PROCEDURES  Procedure(s) performed:   Procedures  Critical Care performed:   ____________________________________________   INITIAL IMPRESSION / ASSESSMENT AND PLAN / ED COURSE  Pertinent labs & imaging  results that were available during my care of the patient were reviewed by me and considered in my medical decision making (see chart for details).  Differential diagnosis includes, but is not limited to, alcohol, illicit or prescription medications, or other toxic ingestion; intracranial pathology such as stroke or intracerebral hemorrhage; fever or infectious causes including sepsis; hypoxemia and/or hypercarbia; uremia; trauma; endocrine related disorders such as diabetes, hypoglycemia, and thyroid-related diseases; hypertensive encephalopathy; etc. Differential includes, but is not limited to, viral syndrome, bronchitis including COPD exacerbation, pneumonia, reactive airway disease including asthma, CHF including exacerbation with or without pulmonary/interstitial edema, pneumothorax, ACS, thoracic trauma, and pulmonary embolism. As part of my medical decision making, I reviewed the following data within the electronic MEDICAL RECORD NUMBER Notes from prior ED visits  ----------------------------------------- 10:47 AM on 10/28/2018 -----------------------------------------  Patient saturating well on 2 L of nasal cannula oxygen but still tachypneic with a respiratory rate of 22-29.  Granddaughter is at the bedside and says that the patient has been very confused at home with increased weakness.  UTI found here.  Patient be admitted to the hospital.  Signed out to Dr. Posey Pronto.  Granddaughter aware of the diagnosis well treatment and willing to comply. ____________________________________________   FINAL CLINICAL IMPRESSION(S) / ED DIAGNOSES  UTI.  Altered mental status.  NEW MEDICATIONS STARTED DURING THIS VISIT:  New Prescriptions   No medications on file     Note:  This document was prepared using Dragon voice recognition software and may include unintentional dictation errors.     Orbie Pyo, MD 10/28/18 1048

## 2018-10-28 NOTE — Progress Notes (Addendum)
PT Cancellation Note  Patient Details Name: Yolanda Wells MRN: [id number] DOB: Jul 21, 1932   Cancelled Treatment:    Reason Eval/Treat Not Completed: Medical issues which prohibited therapy(Consult received and chart reviewed.  Primary RN recommends hold at this time-patient just transferred to floor and not appropriate for mobility assessment at this time.  will continue efforts at later time/date as medically appropriate and available.)   Ambrose Wile H. Owens Shark, PT, DPT, NCS 10/28/18, 2:02 PM [phone]

## 2018-10-29 LAB — CBC
HCT: 28.9 % — ABNORMAL LOW (ref 36.0–46.0)
Hemoglobin: 8.3 g/dL — ABNORMAL LOW (ref 12.0–15.0)
MCH: 24.2 pg — ABNORMAL LOW (ref 26.0–34.0)
MCHC: 28.7 g/dL — ABNORMAL LOW (ref 30.0–36.0)
MCV: 84.3 fL (ref 80.0–100.0)
NRBC: 2.2 % — AB (ref 0.0–0.2)
Platelets: 235 10*3/uL (ref 150–400)
RBC: 3.43 MIL/uL — ABNORMAL LOW (ref 3.87–5.11)
RDW: 29.1 % — ABNORMAL HIGH (ref 11.5–15.5)
WBC: 12.8 10*3/uL — ABNORMAL HIGH (ref 4.0–10.5)

## 2018-10-29 LAB — GLUCOSE, CAPILLARY
Glucose-Capillary: 143 mg/dL — ABNORMAL HIGH (ref 70–99)
Glucose-Capillary: 148 mg/dL — ABNORMAL HIGH (ref 70–99)
Glucose-Capillary: 152 mg/dL — ABNORMAL HIGH (ref 70–99)
Glucose-Capillary: 169 mg/dL — ABNORMAL HIGH (ref 70–99)

## 2018-10-29 LAB — BASIC METABOLIC PANEL
Anion gap: 8 (ref 5–15)
BUN: 12 mg/dL (ref 8–23)
CALCIUM: 8.6 mg/dL — AB (ref 8.9–10.3)
CO2: 27 mmol/L (ref 22–32)
Chloride: 107 mmol/L (ref 98–111)
Creatinine, Ser: 0.52 mg/dL (ref 0.44–1.00)
GFR calc Af Amer: >60 mL/min (ref >60)
GFR calc non Af Amer: >60 mL/min (ref >60)
Glucose, Bld: 173 mg/dL — ABNORMAL HIGH (ref 70–99)
Potassium: 3.6 mmol/L (ref 3.5–5.1)
SODIUM: 142 mmol/L (ref 135–145)

## 2018-10-29 MED ORDER — IPRATROPIUM-ALBUTEROL 0.5-2.5 (3) MG/3ML IN SOLN: 3.0000 mL | Freq: Four times a day (QID) | RESPIRATORY_TRACT | Status: DC | PRN

## 2018-10-29 MED ORDER — IPRATROPIUM-ALBUTEROL 0.5-2.5 (3) MG/3ML IN SOLN
3.0000 mL | Freq: Four times a day (QID) | RESPIRATORY_TRACT | Status: DC
  Administered 2018-10-29 (×2): 3 mL via RESPIRATORY_TRACT
  Filled 2018-10-29 (×2): qty 3

## 2018-10-29 MED ORDER — METHYLPREDNISOLONE SODIUM SUCC 125 MG IJ SOLR
60.0000 mg | Freq: Every day | INTRAMUSCULAR | Status: DC
  Administered 2018-10-30: 60 mg via INTRAVENOUS
  Filled 2018-10-29: qty 2

## 2018-10-29 NOTE — Progress Notes (Signed)
[place] [facility] [place] NAME: Yolanda Wells    MR#:  [id number]  DATE OF BIRTH:  09-16-1932  SUBJECTIVE:   Patient presented to the hospital secondary to altered mental status/shortness of breath.  Patient is a poor historian.  No other acute events overnight.  REVIEW OF SYSTEMS:    Review of Systems  Unable to perform ROS: Mental acuity    Nutrition: Regular Tolerating Diet: Yes Tolerating PT: Eval noted.   DRUG ALLERGIES:  No Known Allergies  VITALS:  Blood pressure 136/65, pulse 81, temperature (!) 97.5 F (36.4 C), temperature source Oral, resp. rate 18, height 5\' 7"  (1.702 m), weight 94.8 kg, SpO2 97 %.  PHYSICAL EXAMINATION:   Physical Exam  GENERAL:  84 y.o.-year-old patient lying in bed in no acute distress.  EYES: Pupils equal, round, reactive to light and accommodation. No scleral icterus. Extraocular muscles intact.  HEENT: Head atraumatic, normocephalic. Oropharynx and nasopharynx clear.  NECK:  Supple, no jugular venous distention. No thyroid enlargement, no tenderness.  LUNGS: Normal breath sounds bilaterally, no wheezing, rales, rhonchi. No use of accessory muscles of respiration.  CARDIOVASCULAR: S1, S2 normal. No murmurs, rubs, or gallops.  ABDOMEN: Soft, nontender, nondistended. Bowel sounds present. No organomegaly or mass.  EXTREMITIES: No cyanosis, clubbing or edema b/l.    NEUROLOGIC: Cranial nerves II through XII are intact. No focal Motor or sensory deficits b/l.   PSYCHIATRIC: The patient is alert and oriented x 2.  SKIN: No obvious rash, lesion, or ulcer.    LABORATORY PANEL:   CBC Recent Labs  Lab 10/29/18 0351  WBC 12.8*  HGB 8.3*  HCT 28.9*  PLT 235   ------------------------------------------------------------------------------------------------------------------  Chemistries  Recent Labs  Lab 10/28/18 0829 10/29/18 0351  NA 140 142  K 3.8 3.6  CL 105 107  CO2 32 27  GLUCOSE  98 173*  BUN 11 12  CREATININE 0.52 0.52  CALCIUM 8.6* 8.6*  AST 17  --   ALT 8  --   ALKPHOS 59  --   BILITOT 1.0  --    ------------------------------------------------------------------------------------------------------------------  Cardiac Enzymes Recent Labs  Lab 10/28/18 0829  TROPONINI 0.04*   ------------------------------------------------------------------------------------------------------------------  RADIOLOGY:  Dg Chest Portable 1 View  Result Date: 10/28/2018 CLINICAL DATA:  Altered mental status and shortness of breath EXAM: PORTABLE CHEST 1 VIEW COMPARISON:  September 27, 2018 FINDINGS: There is stable mild elevation of the right hemidiaphragm. There is no edema or consolidation. Heart is mildly enlarged with pulmonary vascularity normal. There is calcification in the mitral annulus. There is aortic atherosclerosis. No adenopathy. There is degenerative change in each shoulder. IMPRESSION: Stable cardiac prominence. No edema or consolidation. Mild elevation of the right hemidiaphragm is stable. There is aortic atherosclerosis. Aortic Atherosclerosis (ICD10-I70.0). Electronically Signed   By: Lowella Grip III M.D.   On: 10/28/2018 09:53     ASSESSMENT AND PLAN:   84 year old female with past medical history of polycythemia vera, previous history of DVT, osteoarthritis, hypertension, history of pulmonary embolism, who presents to the hospital due to shortness of breath, altered mental status.  1.  Altered mental status-suspected to be metabolic encephalopathy secondary to UTI. - Continue IV ceftriaxone for the UTI, follow urine cultures.  Mental status improving today.  2.  Urinary tract infection-source of patient's altered mental status.  Urine cultures are positive for 100,000 colonies of gram-negative rod with identification pending.  Continue IV ceftriaxone.  3.  Shortness of breath-suspected  to be secondary to underlying mild asthma exacerbation.  She is  in no respiratory distress today and has no wheezing or bronchospasm. -I will taper her IV steroids, switch her to oral prednisone tomorrow.  Continue DuoNebs as needed.  4.  History of recurrent DVT/PE-continue Eliquis.  Patient has a history of IVC filter placement.  5.  History of polycythemia vera-continue Jakafi.  6.  GERD-continue Protonix.  7.  Essential hypertension-continue Norvasc.  8.  Osteoporosis-continue vitamin D supplements.  Likely discharge home with Home Health PT tomorrow.   All the records are reviewed and case discussed with Care Management/Social Worker. Management plans discussed with the patient, family and they are in agreement.  CODE STATUS: DNR  DVT Prophylaxis: Eliquis  TOTAL TIME TAKING CARE OF THIS PATIENT: 30 minutes.   POSSIBLE D/C IN 1-2 DAYS, DEPENDING ON CLINICAL CONDITION.   Henreitta Leber M.D on 10/29/2018 at 2:22 PM  Between 7am to 6pm - Pager - [phone]  After 6pm go to [email or web address] - Proofreader  [facility]  Office  [phone]  CC: Primary care physician; [name], MD

## 2018-10-29 NOTE — Plan of Care (Signed)

## 2018-10-29 NOTE — Care Management (Signed)
Hospice liasion relates that patient is not being advised by hospice staff to present to the ED. Patient proceeded to ED this time because she did not feel well and did not want to get out of bed.  It sounds as though patient nor caregivers are contacting hospice before proceeding to the ED.

## 2018-10-29 NOTE — Evaluation (Signed)
Physical Therapy Evaluation Patient Details Name: Franshesca Chipman MRN: [id number] DOB: Mar 17, 1932 Today's Date: 10/29/2018   History of Present Illness  Pt is 84 yo female admitted for SOB, AMS. PMH of PE and DVT on eliquis, DMII, HTN, CHF with home O2 at night (2L), IVC filter per chart, CVA with L sided weakness, R transmetatarsal amputation, colostomy  Clinical Impression  Patient A&Ox4, no complaints of pain, HR and spO2 stable on room air. Pt able to report PLOF; lives with granddaughter, has an aide, and stated between them she has assistance 24/7. Dependent for ADLs. Able to perform stand pivot transfer with walker to [facility], and self propels around home. Some conflicting information, pt stated 1 and then 2 people assist with mobility, and then returned to saying 1 person assist.  The patient demonstrated bed mobility with minAx1 with HOB elevated. Able to sit EOB with fair balance for several minutes. Stand pivot to chair with RW and modAx2. Several sit <> stand performed during session, modAx2, at least minAx1 to maintain static standing with RW. HR and spO2 WFLs throughout. Pt exhibited mild deficits (see "PT Problem List") that differ from PLOF and would benefit from skilled PT to maximize mobility, independence, and safety. Recommendation is HHPT, pt has all appropriate equipment including a new lift as well as 24/7 assistance (per patient).    Follow Up Recommendations Home health PT    Equipment Recommendations  None recommended by PT;Other (comment)(Pt reported having hospital bed, lift, RW, 4WW at home)    Recommendations for Other Services       Precautions / Restrictions Precautions Precautions: Fall Restrictions Weight Bearing Restrictions: No      Mobility  Bed Mobility Overal bed mobility: Needs Assistance Bed Mobility: Supine to Sit     Supine to sit: Min assist     General bed mobility comments: Pt able to initiate movements, minAx1 to complete  transfer  Transfers Overall transfer level: Needs assistance Equipment used: Rolling walker (2 wheeled) Transfers: Stand Pivot Transfers;Sit to/from Stand Sit to Stand: Mod assist;+2 physical assistance Stand pivot transfers: Mod assist;+2 physical assistance       General transfer comment: Performed sit <> stand transfer x2 this session, able to perform stand pivot transfer to chair. RW and modAx2 throughout mobility  Ambulation/Gait             General Gait Details: deferred  Stairs            Wheelchair Mobility    Modified Rankin (Stroke Patients Only)       Balance Overall balance assessment: Needs assistance Sitting-balance support: Feet supported Sitting balance-Leahy Scale: Fair       Standing balance-Leahy Scale: Poor                               Pertinent Vitals/Pain Pain Assessment: No/denies pain    Home Living Family/patient expects to be discharged to:: Private residence Living Arrangements: Other relatives Available Help at Discharge: Personal care attendant;Family Type of Home: Apartment Home Access: Level entry     Home Layout: One level Home Equipment: Hospital bed;Walker - 4 wheels;Other (comment)(Pt reported recently aquiring a lift) Additional Comments: Pt endorses that she has 24/7 supervision from granddaughter or aides.    Prior Function Level of Independence: Needs assistance   Gait / Transfers Assistance Needed: Pt reported no falls in the last 6 months. Reported she performs stand pivot transfers with walker and 1-2  people assist to wheelchair, able to wheel herself around home.   ADL's / Homemaking Assistance Needed: Pt total assist for ADLs  Comments: Pt noted "transfers to manual wheelchair using stand pivot"      Hand Dominance   Dominant Hand: Right    Extremity/Trunk Assessment   Upper Extremity Assessment Upper Extremity Assessment: Generalized weakness    Lower Extremity Assessment Lower  Extremity Assessment: Generalized weakness(Pt needed AAROM to perform supine SLR)       Communication   Communication: No difficulties  Cognition Arousal/Alertness: Awake/alert Behavior During Therapy: WFL for tasks assessed/performed Overall Cognitive Status: Within Functional Limits for tasks assessed                                 General Comments: some garbled speech      General Comments      Exercises     Assessment/Plan    PT Assessment Patient needs continued PT services  PT Problem List Decreased strength;Decreased activity tolerance;Decreased knowledge of use of DME;Decreased balance;Decreased mobility;Obesity       PT Treatment Interventions DME instruction;Therapeutic exercise;Gait training;Balance training;Stair training;Neuromuscular re-education;Functional mobility training;Therapeutic activities;Patient/family education    PT Goals (Current goals can be found in the Care Plan section)  Acute Rehab PT Goals Patient Stated Goal: Pt wants to stand PT Goal Formulation: With patient Time For Goal Achievement: 11/12/18 Potential to Achieve Goals: Good    Frequency Min 2X/week   Barriers to discharge        Co-evaluation               AM-PAC PT "6 Clicks" Mobility  Outcome Measure Help needed turning from your back to your side while in a flat bed without using bedrails?: A Lot Help needed moving from lying on your back to sitting on the side of a flat bed without using bedrails?: A Lot Help needed moving to and from a bed to a chair (including a wheelchair)?: A Lot Help needed standing up from a chair using your arms (e.g., wheelchair or bedside chair)?: A Lot Help needed to walk in hospital room?: Total Help needed climbing 3-5 steps with a railing? : Total 6 Click Score: 10    End of Session Equipment Utilized During Treatment: Gait belt Activity Tolerance: Patient tolerated treatment well Patient left: with chair alarm set;in  chair;with nursing/sitter in room;with call bell/phone within reach Nurse Communication: Mobility status PT Visit Diagnosis: Other abnormalities of gait and mobility (R26.89);Muscle weakness (generalized) (M62.81)    Time: 0093-8182 PT Time Calculation (min) (ACUTE ONLY): 33 min   Charges:   PT Evaluation $PT Eval Moderate Complexity: 1 Mod PT Treatments $Therapeutic Activity: 8-22 mins       Lieutenant Diego PT, DPT 9:41 AM,10/29/18 [phone]

## 2018-10-29 NOTE — Care Management (Signed)
This is patient's 7 presentation in 6 months.  She is followed by [facility].  Reached out to liaison to discuss management of care delivery in the home

## 2018-10-30 LAB — CBC
HCT: 27.1 % — ABNORMAL LOW (ref 36.0–46.0)
Hemoglobin: 7.8 g/dL — ABNORMAL LOW (ref 12.0–15.0)
MCH: 23.8 pg — ABNORMAL LOW (ref 26.0–34.0)
MCHC: 28.8 g/dL — ABNORMAL LOW (ref 30.0–36.0)
MCV: 82.6 fL (ref 80.0–100.0)
Platelets: 246 10*3/uL (ref 150–400)
RBC: 3.28 MIL/uL — ABNORMAL LOW (ref 3.87–5.11)
RDW: 31.1 % — ABNORMAL HIGH (ref 11.5–15.5)
WBC: 14.3 10*3/uL — AB (ref 4.0–10.5)
nRBC: 2.8 % — ABNORMAL HIGH (ref 0.0–0.2)

## 2018-10-30 LAB — GLUCOSE, CAPILLARY
Glucose-Capillary: 76 mg/dL (ref 70–99)
Glucose-Capillary: 95 mg/dL (ref 70–99)
Glucose-Capillary: 127 mg/dL — ABNORMAL HIGH (ref 70–99)
Glucose-Capillary: 136 mg/dL — ABNORMAL HIGH (ref 70–99)

## 2018-10-30 LAB — URINE CULTURE: Culture: >=100000 — AB

## 2018-10-30 MED ORDER — METOPROLOL TARTRATE 25 MG PO TABS
12.5000 mg | ORAL_TABLET | Freq: Two times a day (BID) | ORAL | Status: DC
  Administered 2018-10-30 – 2018-10-31 (×2): 12.5 mg via ORAL
  Filled 2018-10-30 (×2): qty 1

## 2018-10-30 MED ORDER — PREDNISONE 50 MG PO TABS
50.0000 mg | ORAL_TABLET | Freq: Every day | ORAL | Status: DC
  Administered 2018-10-31: 50 mg via ORAL
  Filled 2018-10-30: qty 1

## 2018-10-30 NOTE — Progress Notes (Signed)
Yolanda Wells NAME: Yolanda Wells    MR#:  [id number]  DATE OF BIRTH:  [birth year]  SUBJECTIVE:   Patient presented to the hospital secondary to altered mental status/shortness of breath.  Patient is a poor historian.  No other acute events overnight. Appears alert and oriented  REVIEW OF SYSTEMS:    Review of Systems  Constitutional: Negative for chills, fever and weight loss.  HENT: Negative for ear discharge, ear pain and nosebleeds.   Eyes: Negative for blurred vision, pain and discharge.  Respiratory: Negative for sputum production, shortness of breath, wheezing and stridor.   Cardiovascular: Negative for chest pain, palpitations, orthopnea and PND.  Gastrointestinal: Negative for abdominal pain, diarrhea, nausea and vomiting.  Genitourinary: Negative for frequency and urgency.  Musculoskeletal: Negative for back pain and joint pain.  Neurological: Negative for sensory change, speech change, focal weakness and weakness.  Psychiatric/Behavioral: Negative for depression and hallucinations. The patient is not nervous/anxious.     Nutrition: Regular Tolerating Diet: Yes Tolerating PT: Eval noted--HHPT  DRUG ALLERGIES:  No Known Allergies  VITALS:  Blood pressure 133/70, pulse 76, temperature 97.9 F (36.6 C), temperature source Oral, resp. rate 16, height 5\' 7"  (1.702 m), weight 94.8 kg, SpO2 95 %.  PHYSICAL EXAMINATION:   Physical Exam  GENERAL:  84 y.o.-year-old patient lying in bed in no acute distress.  EYES: Pupils equal, round, reactive to light and accommodation. No scleral icterus. Extraocular muscles intact.  HEENT: Head atraumatic, normocephalic. Oropharynx and nasopharynx clear.  NECK:  Supple, no jugular venous distention. No thyroid enlargement, no tenderness.  LUNGS: Normal breath sounds bilaterally, no wheezing, rales, rhonchi. No use of accessory muscles of respiration.  CARDIOVASCULAR: S1, S2  normal. No murmurs, rubs, or gallops.  ABDOMEN: Soft, nontender, nondistended. Bowel sounds present. No organomegaly or mass.  EXTREMITIES: No cyanosis, clubbing or edema b/l.    NEUROLOGIC: Cranial nerves II through XII are intact. No focal Motor or sensory deficits b/l.   PSYCHIATRIC: The patient is alert and oriented x 2.  SKIN: No obvious rash, lesion, or ulcer.    LABORATORY PANEL:   CBC Recent Labs  Lab 10/30/18 0456  WBC 14.3*  HGB 7.8*  HCT 27.1*  PLT 246   ------------------------------------------------------------------------------------------------------------------  Chemistries  Recent Labs  Lab 10/28/18 0829 10/29/18 0351  NA 140 142  K 3.8 3.6  CL 105 107  CO2 32 27  GLUCOSE 98 173*  BUN 11 12  CREATININE 0.52 0.52  CALCIUM 8.6* 8.6*  AST 17  --   ALT 8  --   ALKPHOS 59  --   BILITOT 1.0  --    ------------------------------------------------------------------------------------------------------------------  Cardiac Enzymes Recent Labs  Lab 10/28/18 0829  TROPONINI 0.04*   ------------------------------------------------------------------------------------------------------------------  RADIOLOGY:  No results found.   ASSESSMENT AND PLAN:   84 year old female with past medical history of polycythemia vera, previous history of DVT, osteoarthritis, hypertension, history of pulmonary embolism, who presents to the hospital due to shortness of breath, altered mental status.  1.  Altered mental status-suspected to be metabolic encephalopathy secondary to UTI. - Continue IV ceftriaxone for the UTI, follow up urine cultures shows Proteus providencia.  Mental status improving today.  2.  Urinary tract infection-source of patient's altered mental status.  Urine cultures are positive for 100,000 colonies of proteus providencia sensitive to  IV ceftriaxone.  3.  Shortness of breath-suspected to be secondary to underlying mild asthma exacerbation.  She  is in no respiratory distress today and has no wheezing or bronchospasm. -I will taper her IV steroids, switch her to oral prednisone today  Continue DuoNebs as needed.  4.  History of recurrent DVT/PE-continue Eliquis.  Patient has a history of IVC filter placement.  5.  History of polycythemia vera-continue Jakafi.  6.  GERD-continue Protonix.  7.  Essential hypertension-continue Norvasc.  8.  Osteoporosis-continue vitamin D supplements.  Overall improving. D/c home with HHPT in am D/w Sherley Bounds does not want Hospice to follow  All the records are reviewed and case discussed with Care Management/Social Worker. Management plans discussed with the patient, family and they are in agreement.  CODE STATUS: DNR  DVT Prophylaxis: Eliquis  TOTAL TIME TAKING CARE OF THIS PATIENT: 30 minutes.   POSSIBLE D/C IN 1-2 DAYS, DEPENDING ON CLINICAL CONDITION.   Fritzi Mandes M.D on 10/30/2018 at 3:01 PM  Between 7am to 6pm - Pager - [phone]  After 6pm go to [email or web address] - Proofreader  [facility] [place] [facility]  Office  [phone]  CC: Primary care physician; [name], MD

## 2018-10-30 NOTE — Plan of Care (Signed)

## 2018-10-31 LAB — CBC
HCT: 30.4 % — ABNORMAL LOW (ref 36.0–46.0)
HEMOGLOBIN: 8.7 g/dL — AB (ref 12.0–15.0)
MCH: 23.6 pg — ABNORMAL LOW (ref 26.0–34.0)
MCHC: 28.6 g/dL — ABNORMAL LOW (ref 30.0–36.0)
MCV: 82.4 fL (ref 80.0–100.0)
Platelets: 271 10*3/uL (ref 150–400)
RBC: 3.69 MIL/uL — ABNORMAL LOW (ref 3.87–5.11)
RDW: 30.8 % — ABNORMAL HIGH (ref 11.5–15.5)
WBC: 11.8 10*3/uL — ABNORMAL HIGH (ref 4.0–10.5)
nRBC: 3.6 % — ABNORMAL HIGH (ref 0.0–0.2)

## 2018-10-31 LAB — GLUCOSE, CAPILLARY
Glucose-Capillary: 77 mg/dL (ref 70–99)
Glucose-Capillary: 98 mg/dL (ref 70–99)

## 2018-10-31 MED ORDER — CEFDINIR 300 MG PO CAPS
300.0000 mg | ORAL_CAPSULE | Freq: Two times a day (BID) | ORAL | Status: DC
  Filled 2018-10-31 (×2): qty 1

## 2018-10-31 MED ORDER — AMLODIPINE BESYLATE 5 MG PO TABS: 10.0000 mg | ORAL_TABLET | Freq: Every day | ORAL | 1 refills | Status: AC

## 2018-10-31 MED ORDER — CEFDINIR 300 MG PO CAPS: 300.0000 mg | ORAL_CAPSULE | Freq: Two times a day (BID) | ORAL | 0 refills | Status: DC

## 2018-10-31 NOTE — Clinical Social Work Note (Signed)
CSW received a call from the patient's RN that the family wants to discuss LTC options for the future. As the patient will discharge, today, the patient would benefit from the addition of HHSW to her discharge plan. The CSW has updated the attending RN and the [facility] of this recommendation. CSW is signing off. Please consult should additional needs arise.  Santiago Bumpers, MSW, Latanya Presser [phone]

## 2018-10-31 NOTE — Progress Notes (Signed)
Pt D/C to home via EMS. All belongings sent with pt. Education completed with granddaughter [name] over the phone. VSS. IV removed intact.

## 2018-10-31 NOTE — Discharge Summary (Signed)
[place] [facility] [place] NAME: Yolanda Wells    MR#:  [id number]  DATE OF BIRTH:  May 23, 1932  DATE OF ADMISSION:  10/28/2018 ADMITTING PHYSICIAN: Dustin Flock, MD  DATE OF DISCHARGE: 10/31/2018  PRIMARY CARE PHYSICIAN: Yolanda Boys, MD    ADMISSION DIAGNOSIS:  Urinary tract infection without hematuria, site unspecified [N39.0] Altered mental status, unspecified altered mental status type [R41.82]  DISCHARGE DIAGNOSIS:   Recurrent UTI  SECONDARY DIAGNOSIS:   Past Medical History:  Diagnosis Date  . Acute deep vein thrombosis (DVT) of distal vein of left lower extremity ([place]) 10/03/2015  . Acute pulmonary embolism ([place]) 10/03/2015  . Anticoagulant long-term use   . Arthritis   . Arthritis   . CHF (congestive heart failure) ([place])   . Chronic anticoagulation 10/03/2015  . Collagen vascular disease ([place])   . Colostomy in place [facility]) 10/04/2015  . Deep venous thrombosis ([facility])    right lower extremity  . Dependent edema   . Diverticulosis of intestine with bleeding 04/02/2015  . Glenohumeral arthritis 06/28/2012  . Hammertoe 09/02/2012  . History of bilateral hip replacements 09/02/2012  . History of hysterectomy   . Hypertension   . Hypertension   . Hypokalemia   . Inability to ambulate due to hip 10/12/2015  . Mandibular dysfunction   . Onychomycosis 09/02/2012  . Osteoarthritis   . Polycythemia vera ([place])   . Presence of IVC filter 05/25/2015  . Stroke ([place]) 03/26/2015   cerebellar  . Urinary incontinence in female 09/02/2016    HOSPITAL COURSE:  84 year old female with past medical history of polycythemia vera, previous history of DVT, osteoarthritis, hypertension, history of pulmonary embolism, who presents to the hospital due to shortness of breath, altered mental status.  1.  Altered mental status-suspected to be metabolic encephalopathy secondary to UTI. - Continue IV ceftriaxone for the UTI, follow up  urine cultures shows Proteus providencia--change to po cefdinir for (10 days and then PCP can consider prophylactic abx as out pt - Mental status improving today.  2.  Urinary tract infection-source of patient's altered mental status.  Urine cultures are positive for 100,000 colonies of proteus providencia sensitive to  IV ceftriaxone--change to oral abxs  3.  Shortness of breath-suspected to be secondary to underlying mild asthma exacerbation.  She is in no respiratory distress today and has no wheezing or bronchospasm. -pt recievedIV steroids, switch her to oral prednisone --d/c now since not more respiraoty distress or wheezing -Continue DuoNebs as needed.  4.  History of recurrent DVT/PE-continue Eliquis.  Patient has a history of IVC filter placement.  5.  History of polycythemia vera-continue Jakafi.  6.  GERD-continue Protonix.  7.  Essential hypertension-continue Norvasc.  8.  Osteoporosis-continue vitamin D supplements.  Overall improving. D/c home with HHPT/RN and SW Grand dter Sherley Bounds does not want Hospice to follow. Per RN family aware that pt will go home via ambulance  CONSULTS OBTAINED:  Treatment Team:  Fritzi Mandes, MD  DRUG ALLERGIES:  No Known Allergies  DISCHARGE MEDICATIONS:   Allergies as of 10/31/2018   No Known Allergies     Medication List    STOP taking these medications   ferrous sulfate 325 (65 FE) MG tablet   metoprolol tartrate 25 MG tablet Commonly known as:  LOPRESSOR   morphine CONCENTRATE 10 MG/0.5ML Soln concentrated solution     TAKE these medications   amLODipine 5 MG tablet Commonly known as:  NORVASC Take 2 tablets (10 mg  total) by mouth daily. What changed:  how much to take   cefdinir 300 MG capsule Commonly known as:  OMNICEF Take 1 capsule (300 mg total) by mouth every 12 (twelve) hours.   cholecalciferol 1000 units tablet Commonly known as:  VITAMIN D Take 1,000 Units by mouth daily.   ELIQUIS 5 MG  Tabs tablet Generic drug:  apixaban TAKE ONE TABLET BY MOUTH TWICE A DAY What changed:  how much to take   estradiol 0.1 MG/GM vaginal cream Commonly known as:  ESTRACE VAGINAL Apply 0.5mg  (pea-sized amount)  just inside the vaginal introitus with a finger-tip on Monday, Wednesday and Friday nights.   folic acid 161 MCG tablet Commonly known as:  FOLVITE Take 400 mcg by mouth daily.   ipratropium-albuterol 0.5-2.5 (3) MG/3ML Soln Commonly known as:  DUONEB Take 3 mLs by nebulization every 4 (four) hours as needed (for shortness of breath/wheezing).   JAKAFI 15 MG tablet Generic drug:  ruxolitinib phosphate TAKE 1 TABLET (15 MG TOTAL) BY MOUTH 2 TIMES DAILY. What changed:  See the new instructions.   pantoprazole 40 MG tablet Commonly known as:  PROTONIX Take 1 tablet (40 mg total) by mouth daily.       If you experience worsening of your admission symptoms, develop shortness of breath, life threatening emergency, suicidal or homicidal thoughts you must seek medical attention immediately by calling 911 or calling your MD immediately  if symptoms less severe.  You Must read complete instructions/literature along with all the possible adverse reactions/side effects for all the Medicines you take and that have been prescribed to you. Take any new Medicines after you have completely understood and accept all the possible adverse reactions/side effects.   Please note  You were cared for by a hospitalist during your hospital stay. If you have any questions about your discharge medications or the care you received while you were in the hospital after you are discharged, you can call the unit and asked to speak with the hospitalist on call if the hospitalist that took care of you is not available. Once you are discharged, your primary care physician will handle any further medical issues. Please note that NO REFILLS for any discharge medications will be authorized once you are discharged,  as it is imperative that you return to your primary care physician (or establish a relationship with a primary care physician if you do not have one) for your aftercare needs so that they can reassess your need for medications and monitor your lab values. Today   SUBJECTIVE   Overall better  VITAL SIGNS:  Blood pressure (!) 147/81, pulse 61, temperature 98.5 F (36.9 C), temperature source Oral, resp. rate 18, height 5\' 7"  (1.702 m), weight 94.8 kg, SpO2 95 %.  I/O:    Intake/Output Summary (Last 24 hours) at 10/31/2018 1206 Last data filed at 10/31/2018 0900 Gross per 24 hour  Intake 480 ml  Output 1550 ml  Net -1070 ml    PHYSICAL EXAMINATION:  GENERAL:  84 y.o.-year-old patient lying in the bed with no acute distress.  EYES: Pupils equal, round, reactive to light and accommodation. No scleral icterus. Extraocular muscles intact.  HEENT: Head atraumatic, normocephalic. Oropharynx and nasopharynx clear.  NECK:  Supple, no jugular venous distention. No thyroid enlargement, no tenderness.  LUNGS: Normal breath sounds bilaterally, no wheezing, rales,rhonchi or crepitation. No use of accessory muscles of respiration.  CARDIOVASCULAR: S1, S2 normal. No murmurs, rubs, or gallops.  ABDOMEN: Soft, non-tender, non-distended. Bowel  sounds present. No organomegaly or mass.  EXTREMITIES: No pedal edema, cyanosis, or clubbing.  NEUROLOGIC: Cranial nerves II through XII are intact. Muscle strength 5/5 in all extremities. Sensation intact. Gait not checked.  PSYCHIATRIC: The patient is alert and oriented x 3.  SKIN: No obvious rash, lesion, or ulcer.   DATA REVIEW:   CBC  Recent Labs  Lab 10/31/18 0449  WBC 11.8*  HGB 8.7*  HCT 30.4*  PLT 271    Chemistries  Recent Labs  Lab 10/28/18 0829 10/29/18 0351  NA 140 142  K 3.8 3.6  CL 105 107  CO2 32 27  GLUCOSE 98 173*  BUN 11 12  CREATININE 0.52 0.52  CALCIUM 8.6* 8.6*  AST 17  --   ALT 8  --   ALKPHOS 59  --   BILITOT  1.0  --     Microbiology Results   Recent Results (from the past 240 hour(s))  Urine Culture     Status: Abnormal   Collection Time: 10/28/18  8:29 AM  Result Value Ref Range Status   Specimen Description   Final    URINE, CATHETERIZED Performed at [facility], [address] [place], [place] [address]    Special Requests   Final    NONE Performed at [facility], [address] [place], [place] [address]    Culture >=100,000 COLONIES/mL PROVIDENCIA STUARTII (A)  Final   Report Status 10/30/2018 FINAL  Final   Organism ID, Bacteria PROVIDENCIA STUARTII (A)  Final      Susceptibility   Providencia stuartii - MIC*    AMPICILLIN >=32 RESISTANT Resistant     CEFAZOLIN >=64 RESISTANT Resistant     CEFTRIAXONE <=1 SENSITIVE Sensitive     CIPROFLOXACIN >=4 RESISTANT Resistant     GENTAMICIN RESISTANT Resistant     IMIPENEM 0.5 SENSITIVE Sensitive     NITROFURANTOIN 256 RESISTANT Resistant     TRIMETH/SULFA <=20 SENSITIVE Sensitive     AMPICILLIN/SULBACTAM 16 INTERMEDIATE Intermediate     PIP/TAZO <=4 SENSITIVE Sensitive     * >=100,000 COLONIES/mL PROVIDENCIA STUARTII    RADIOLOGY:  No results found.   Management plans discussed with the patient, family and they are in agreement.  CODE STATUS:     Code Status Orders  (From admission, onward)         Start     Ordered   10/28/18 1335  Do not attempt resuscitation (DNR)  Continuous    Question Answer Comment  In the event of cardiac or respiratory ARREST Do not call a "code blue"   In the event of cardiac or respiratory ARREST Do not perform Intubation, CPR, defibrillation or ACLS   In the event of cardiac or respiratory ARREST Use medication by any route, position, wound care, and other measures to relive pain and suffering. May use oxygen, suction and manual treatment of airway obstruction as needed for comfort.   Comments nurse may pronounce      10/28/18 1334        Code Status History     Date Active Date Inactive Code Status Order ID Comments User Context   09/27/2018 1901 09/30/2018 1545 DNR [id number]  Loletha Grayer, MD ED   09/22/2018 0133 09/25/2018 2201 Full Code [id number]  Amelia Jo, MD Inpatient   09/09/2018 1240 09/12/2018 2008 Full Code [id number]  Loletha Grayer, MD ED   08/30/2018 2018 09/01/2018 2004 DNR [id number]  Nicholes Mango, MD ED   08/30/2018 1903 08/30/2018 2018  Full Code [id number]  Nicholes Mango, MD ED   08/13/2018 1038 08/15/2018 1836 Full Code [id number]  Loletha Grayer, MD Inpatient   08/12/2018 2001 08/13/2018 1038 DNR [id number]  Gorden Harms, MD ED   08/12/2018 2001 08/12/2018 2001 Full Code [id number]  Salary, Avel Peace, MD ED   06/15/2018 1635 06/18/2018 2009 Full Code [id number]  [name], MD Inpatient   02/24/2018 0347 02/27/2018 0027 Full Code [id number]  Amelia Jo, MD Inpatient   10/15/2017 1141 10/16/2017 2204 Full Code [id number]  Knox Royalty, NP Inpatient   10/13/2017 2246 10/15/2017 1141 DNR [id number]  Lance Coon, MD ED   10/06/2017 1454 10/09/2017 2058 DNR [id number]  Nicholes Mango, MD Inpatient   10/06/2017 1111 10/06/2017 1453 Full Code [id number]  Nicholes Mango, MD Inpatient   05/27/2017 1954 05/28/2017 2013 Full Code [id number]  Vaughan Basta, MD Inpatient   05/23/2017 2322 05/26/2017 1614 Full Code [id number]  Lance Coon, MD Inpatient   04/10/2017 0325 04/11/2017 1916 Full Code [id number]  Harrie Foreman, MD Inpatient   11/23/2016 0629 12/01/2016 1935 Full Code [id number]  Harrie Foreman, MD Inpatient   08/26/2016 0445 08/26/2016 1923 Full Code [id number]  Saundra Shelling, MD Inpatient   09/22/2015 1755 09/25/2015 1844 Full Code [id number]  Demetrios Loll, MD ED   09/15/2015 0341 09/17/2015 1741 Full Code [id number]  Harrie Foreman, MD Inpatient   07/16/2015 0151 07/20/2015 2334 Full Code [id number]  Lance Coon, MD Inpatient   05/25/2015 0834 05/29/2015 1616 Full Code [id number]  Harrie Foreman, MD Inpatient   04/02/2015 0606  04/08/2015 1651 Full Code [id number]  Harrie Foreman, MD Inpatient      TOTAL TIME TAKING CARE OF THIS PATIENT: *40 minutes.    Fritzi Mandes M.D on 10/31/2018 at 12:06 PM  Between 7am to 6pm - Pager - [phone] After 6pm go to [email or web address] - password [email or web address] [place] [facility]  Office  [phone]  CC: Primary care physician; Yolanda Boys, MD

## 2018-10-31 NOTE — Care Management Note (Signed)
Case Management Note  Patient Details  Name: Yolanda Wells MRN: [id number] Date of Birth: 1932/11/04  Subjective/Objective:  Patient to be discharged per MD order. Orders in place for home health services. Patient open with [facility] hosp[facility] but is looking to transition back to home health services. Per [name] with [facility] they have had some difficulty with this case and is having Tim from [facility] coordinate plans with the grand daughter. I receieved a call from the grand daughter stating she talked to Tim and that she has what she needs from a DME and service standpoint. Her goal is to use the social worker to assist in looking for long term care. Per the grand daughter she has what she needs and there is nothing we can offer her at this point.                   Action/Plan:   Expected Discharge Date:  10/31/18               Expected Discharge Plan:  [place]  In-House Referral:     Discharge planning Services  CM Consult  Post Acute Care Choice:  Home Health Choice offered to:  Patient  DME Arranged:    DME Agency:     HH Arranged:  RN, PT, Social Work [employer] Agency:  [facility]  Status of Service:  Completed, signed off  If discussed at [employer], dates discussed:    Additional Comments:  Latanya Maudlin, RN 10/31/2018, 2:56 PM

## 2018-11-03 ENCOUNTER — Other Ambulatory Visit: Payer: Self-pay | Admitting: Hematology and Oncology

## 2018-11-04 MED FILL — JAKAFI 15 MG TABLET: 15 | 30 days supply | Qty: 60 | Fill #0

## 2018-11-04 NOTE — Telephone Encounter (Signed)
  Can we find out if she is still taking? (She should be unless there is a contraindication)  She has missed several appointments and has been in and out of the hospital.  Thanks,  M

## 2018-11-04 NOTE — Telephone Encounter (Signed)
4. History of recurrent DVT/PE-continue Eliquis. Patient has a history of IVC filter placement.  5. History of polycythemia vera-continue Jakafi.  6. GERD-continue Protonix.  7. Essential hypertension-continue Norvasc.  8. Osteoporosis-continue vitamin D supplements.  Overall improving. D/c home with HHPT/RN and SW Grand dter Sherley Bounds does not want Hospice to follow. Per RN family aware that pt will go home via ambulance  CONSULTS OBTAINED:  Treatment Team:  Fritzi Mandes, MD

## 2018-11-08 ENCOUNTER — Encounter: Payer: Self-pay | Admitting: Emergency Medicine

## 2018-11-08 ENCOUNTER — Other Ambulatory Visit: Payer: Self-pay

## 2018-11-08 ENCOUNTER — Emergency Department: Payer: Medicare Other

## 2018-11-08 ENCOUNTER — Inpatient Hospital Stay
Admission: EM | Admit: 2018-11-08 | Discharge: 2018-11-10 | DRG: 190 | Disposition: A | Discharge status: Hospice | Payer: Medicare Other | Attending: Internal Medicine | Admitting: Internal Medicine

## 2018-11-08 DIAGNOSIS — R0602 Shortness of breath: Secondary | ICD-10-CM | POA: Diagnosis present

## 2018-11-08 DIAGNOSIS — E119 Type 2 diabetes mellitus without complications: Secondary | ICD-10-CM | POA: Diagnosis present

## 2018-11-08 DIAGNOSIS — J440 Chronic obstructive pulmonary disease with acute lower respiratory infection: Secondary | ICD-10-CM | POA: Diagnosis present

## 2018-11-08 DIAGNOSIS — J441 Chronic obstructive pulmonary disease with (acute) exacerbation: Secondary | ICD-10-CM | POA: Diagnosis not present

## 2018-11-08 DIAGNOSIS — Z833 Family history of diabetes mellitus: Secondary | ICD-10-CM

## 2018-11-08 DIAGNOSIS — Z95828 Presence of other vascular implants and grafts: Secondary | ICD-10-CM

## 2018-11-08 DIAGNOSIS — Z9981 Dependence on supplemental oxygen: Secondary | ICD-10-CM

## 2018-11-08 DIAGNOSIS — Z933 Colostomy status: Secondary | ICD-10-CM

## 2018-11-08 DIAGNOSIS — Z7901 Long term (current) use of anticoagulants: Secondary | ICD-10-CM

## 2018-11-08 DIAGNOSIS — D45 Polycythemia vera: Secondary | ICD-10-CM | POA: Diagnosis present

## 2018-11-08 DIAGNOSIS — Z86711 Personal history of pulmonary embolism: Secondary | ICD-10-CM

## 2018-11-08 DIAGNOSIS — Z90710 Acquired absence of both cervix and uterus: Secondary | ICD-10-CM

## 2018-11-08 DIAGNOSIS — Z86718 Personal history of other venous thrombosis and embolism: Secondary | ICD-10-CM

## 2018-11-08 DIAGNOSIS — J9621 Acute and chronic respiratory failure with hypoxia: Secondary | ICD-10-CM

## 2018-11-08 DIAGNOSIS — Z8249 Family history of ischemic heart disease and other diseases of the circulatory system: Secondary | ICD-10-CM

## 2018-11-08 DIAGNOSIS — Z89431 Acquired absence of right foot: Secondary | ICD-10-CM

## 2018-11-08 DIAGNOSIS — Z515 Encounter for palliative care: Secondary | ICD-10-CM | POA: Diagnosis present

## 2018-11-08 DIAGNOSIS — I110 Hypertensive heart disease with heart failure: Secondary | ICD-10-CM | POA: Diagnosis present

## 2018-11-08 DIAGNOSIS — Z66 Do not resuscitate: Secondary | ICD-10-CM | POA: Diagnosis present

## 2018-11-08 DIAGNOSIS — Z79899 Other long term (current) drug therapy: Secondary | ICD-10-CM

## 2018-11-08 DIAGNOSIS — K219 Gastro-esophageal reflux disease without esophagitis: Secondary | ICD-10-CM | POA: Diagnosis present

## 2018-11-08 DIAGNOSIS — J45901 Unspecified asthma with (acute) exacerbation: Secondary | ICD-10-CM | POA: Diagnosis present

## 2018-11-08 DIAGNOSIS — J209 Acute bronchitis, unspecified: Secondary | ICD-10-CM | POA: Diagnosis present

## 2018-11-08 DIAGNOSIS — Z825 Family history of asthma and other chronic lower respiratory diseases: Secondary | ICD-10-CM

## 2018-11-08 DIAGNOSIS — Z8673 Personal history of transient ischemic attack (TIA), and cerebral infarction without residual deficits: Secondary | ICD-10-CM

## 2018-11-08 DIAGNOSIS — F0150 Vascular dementia without behavioral disturbance: Secondary | ICD-10-CM | POA: Diagnosis present

## 2018-11-08 DIAGNOSIS — Z96643 Presence of artificial hip joint, bilateral: Secondary | ICD-10-CM | POA: Diagnosis present

## 2018-11-08 DIAGNOSIS — R627 Adult failure to thrive: Secondary | ICD-10-CM | POA: Diagnosis present

## 2018-11-08 DIAGNOSIS — I5032 Chronic diastolic (congestive) heart failure: Secondary | ICD-10-CM | POA: Diagnosis present

## 2018-11-08 LAB — CBC WITH DIFFERENTIAL/PLATELET
Abs Immature Granulocytes: 0.47 10*3/uL — ABNORMAL HIGH (ref 0.00–0.07)
BASOS ABS: 0 10*3/uL (ref 0.0–0.1)
Basophils Relative: 0 %
Eosinophils Absolute: 0.1 10*3/uL (ref 0.0–0.5)
Eosinophils Relative: 2 %
HCT: 29.1 % — ABNORMAL LOW (ref 36.0–46.0)
Hemoglobin: 8.3 g/dL — ABNORMAL LOW (ref 12.0–15.0)
Immature Granulocytes: 8 %
Lymphocytes Relative: 15 %
Lymphs Abs: 0.9 10*3/uL (ref 0.7–4.0)
MCH: 23.3 pg — AB (ref 26.0–34.0)
MCHC: 28.5 g/dL — ABNORMAL LOW (ref 30.0–36.0)
MCV: 81.7 fL (ref 80.0–100.0)
Monocytes Absolute: 0.2 10*3/uL (ref 0.1–1.0)
Monocytes Relative: 3 %
NEUTROS PCT: 72 %
NRBC: 1.6 % — AB (ref 0.0–0.2)
Neutro Abs: 4 10*3/uL (ref 1.7–7.7)
Platelets: 217 10*3/uL (ref 150–400)
RBC: 3.56 MIL/uL — AB (ref 3.87–5.11)
RDW: 31.1 % — ABNORMAL HIGH (ref 11.5–15.5)
WBC: 5.6 10*3/uL (ref 4.0–10.5)

## 2018-11-08 LAB — COMPREHENSIVE METABOLIC PANEL
ALT: 7 U/L (ref 0–44)
AST: 11 U/L — ABNORMAL LOW (ref 15–41)
Albumin: 2.6 g/dL — ABNORMAL LOW (ref 3.5–5.0)
Alkaline Phosphatase: 56 U/L (ref 38–126)
Anion gap: 6 (ref 5–15)
BUN: 10 mg/dL (ref 8–23)
CO2: 29 mmol/L (ref 22–32)
Calcium: 8.7 mg/dL — ABNORMAL LOW (ref 8.9–10.3)
Chloride: 104 mmol/L (ref 98–111)
Creatinine, Ser: 0.45 mg/dL (ref 0.44–1.00)
GFR calc Af Amer: >60 mL/min (ref >60)
GFR calc non Af Amer: >60 mL/min (ref >60)
Glucose, Bld: 94 mg/dL (ref 70–99)
Potassium: 3.9 mmol/L (ref 3.5–5.1)
Sodium: 139 mmol/L (ref 135–145)
Total Bilirubin: 0.6 mg/dL (ref 0.3–1.2)
Total Protein: 6.1 g/dL — ABNORMAL LOW (ref 6.5–8.1)

## 2018-11-08 LAB — TROPONIN I: TROPONIN I: 0.03 ng/mL — AB (ref <0.03)

## 2018-11-08 LAB — INFLUENZA PANEL BY PCR (TYPE A & B)
INFLAPCR: NEGATIVE
Influenza B By PCR: NEGATIVE

## 2018-11-08 MED ORDER — APIXABAN 5 MG PO TABS
5.0000 mg | ORAL_TABLET | Freq: Two times a day (BID) | ORAL | Status: DC
  Administered 2018-11-08 – 2018-11-10 (×5): 5 mg via ORAL
  Filled 2018-11-08 (×5): qty 1

## 2018-11-08 MED ORDER — SODIUM CHLORIDE 0.9% FLUSH: 3.0000 mL | INTRAVENOUS | Status: DC | PRN

## 2018-11-08 MED ORDER — SODIUM CHLORIDE 0.9 % IV SOLN: 250.0000 mL | INTRAVENOUS | Status: DC | PRN

## 2018-11-08 MED ORDER — ACETAMINOPHEN 650 MG RE SUPP: 650.0000 mg | Freq: Four times a day (QID) | RECTAL | Status: DC | PRN

## 2018-11-08 MED ORDER — ORAL CARE MOUTH RINSE
15.0000 mL | Freq: Two times a day (BID) | OROMUCOSAL | Status: DC
  Administered 2018-11-08 – 2018-11-09 (×3): 15 mL via OROMUCOSAL

## 2018-11-08 MED ORDER — AMLODIPINE BESYLATE 10 MG PO TABS
10.0000 mg | ORAL_TABLET | Freq: Every day | ORAL | Status: DC
  Administered 2018-11-08 – 2018-11-10 (×3): 10 mg via ORAL
  Filled 2018-11-08 (×3): qty 1

## 2018-11-08 MED ORDER — ONDANSETRON HCL 4 MG/2ML IJ SOLN: 4.0000 mg | Freq: Four times a day (QID) | INTRAMUSCULAR | Status: DC | PRN

## 2018-11-08 MED ORDER — IPRATROPIUM-ALBUTEROL 0.5-2.5 (3) MG/3ML IN SOLN
3.0000 mL | Freq: Once | RESPIRATORY_TRACT | Status: AC
  Administered 2018-11-08: 3 mL via RESPIRATORY_TRACT
  Filled 2018-11-08: qty 3

## 2018-11-08 MED ORDER — SODIUM CHLORIDE 0.9% FLUSH
3.0000 mL | Freq: Two times a day (BID) | INTRAVENOUS | Status: DC
  Administered 2018-11-08 – 2018-11-10 (×5): 3 mL via INTRAVENOUS

## 2018-11-08 MED ORDER — PANTOPRAZOLE SODIUM 40 MG PO TBEC
40.0000 mg | DELAYED_RELEASE_TABLET | Freq: Every day | ORAL | Status: DC
  Administered 2018-11-08 – 2018-11-10 (×3): 40 mg via ORAL
  Filled 2018-11-08 (×3): qty 1

## 2018-11-08 MED ORDER — METHYLPREDNISOLONE SODIUM SUCC 125 MG IJ SOLR: 60.0000 mg | Freq: Two times a day (BID) | INTRAMUSCULAR | Status: DC

## 2018-11-08 MED ORDER — GUAIFENESIN ER 600 MG PO TB12
600.0000 mg | ORAL_TABLET | Freq: Two times a day (BID) | ORAL | Status: DC
  Administered 2018-11-08 – 2018-11-10 (×4): 600 mg via ORAL
  Filled 2018-11-08 (×4): qty 1

## 2018-11-08 MED ORDER — FOLIC ACID 1 MG PO TABS
500.0000 ug | ORAL_TABLET | Freq: Every day | ORAL | Status: DC
  Administered 2018-11-08 – 2018-11-10 (×3): 0.5 mg via ORAL
  Filled 2018-11-08 (×3): qty 1

## 2018-11-08 MED ORDER — ACETAMINOPHEN 325 MG PO TABS: 650.0000 mg | ORAL_TABLET | Freq: Four times a day (QID) | ORAL | Status: DC | PRN

## 2018-11-08 MED ORDER — ONDANSETRON HCL 4 MG PO TABS: 4.0000 mg | ORAL_TABLET | Freq: Four times a day (QID) | ORAL | Status: DC | PRN

## 2018-11-08 MED ORDER — IPRATROPIUM-ALBUTEROL 0.5-2.5 (3) MG/3ML IN SOLN
3.0000 mL | RESPIRATORY_TRACT | Status: DC
  Administered 2018-11-08 – 2018-11-09 (×7): 3 mL via RESPIRATORY_TRACT
  Filled 2018-11-08 (×7): qty 3

## 2018-11-08 MED ORDER — ESTRADIOL 0.1 MG/GM VA CREA
1.0000 | TOPICAL_CREAM | VAGINAL | Status: DC
  Administered 2018-11-08: 1 via VAGINAL
  Filled 2018-11-08: qty 42.5

## 2018-11-08 MED ORDER — METHYLPREDNISOLONE SODIUM SUCC 125 MG IJ SOLR
60.0000 mg | Freq: Two times a day (BID) | INTRAMUSCULAR | Status: DC
  Administered 2018-11-08 – 2018-11-10 (×4): 60 mg via INTRAVENOUS
  Filled 2018-11-08 (×4): qty 2

## 2018-11-08 MED ORDER — METHYLPREDNISOLONE SODIUM SUCC 125 MG IJ SOLR
125.0000 mg | Freq: Once | INTRAMUSCULAR | Status: AC
  Administered 2018-11-08: 125 mg via INTRAVENOUS
  Filled 2018-11-08: qty 2

## 2018-11-08 MED ORDER — BUDESONIDE 0.25 MG/2ML IN SUSP
0.2500 mg | Freq: Two times a day (BID) | RESPIRATORY_TRACT | Status: DC
  Administered 2018-11-08 – 2018-11-10 (×4): 0.25 mg via RESPIRATORY_TRACT
  Filled 2018-11-08 (×4): qty 2

## 2018-11-08 MED ORDER — RUXOLITINIB PHOSPHATE 15 MG PO TABS
15.0000 mg | ORAL_TABLET | Freq: Two times a day (BID) | ORAL | Status: DC
  Administered 2018-11-08 – 2018-11-10 (×4): 15 mg via ORAL
  Filled 2018-11-08 (×5): qty 1

## 2018-11-08 MED ORDER — VITAMIN D 25 MCG (1000 UNIT) PO TABS
1000.0000 [IU] | ORAL_TABLET | Freq: Every day | ORAL | Status: DC
  Administered 2018-11-08 – 2018-11-10 (×3): 1000 [IU] via ORAL
  Filled 2018-11-08 (×3): qty 1

## 2018-11-08 NOTE — ED Provider Notes (Signed)
[facility] Emergency Department Provider Note    First MD Initiated Contact with Patient 11/08/18 1019     (approximate)  I have reviewed the triage vital signs and the nursing notes.   HISTORY  Chief Complaint Shortness of Breath    HPI Yolanda Wells is a 84 y.o. female history as described below who presents with worsening shortness of breath as well as diffuse muscle aches and myalgias.  Patient found to be hypoxic via EMS.  Poorly has been compliant with her medications.  No measured fevers.    Past Medical History:  Diagnosis Date  . Acute deep vein thrombosis (DVT) of distal vein of left lower extremity ([place]) 10/03/2015  . Acute pulmonary embolism ([place]) 10/03/2015  . Anticoagulant long-term use   . Arthritis   . Arthritis   . CHF (congestive heart failure) ([place])   . Chronic anticoagulation 10/03/2015  . Collagen vascular disease ([place])   . Colostomy in place [facility]) 10/04/2015  . Deep venous thrombosis ([facility])    right lower extremity  . Dependent edema   . Diverticulosis of intestine with bleeding 04/02/2015  . Glenohumeral arthritis 06/28/2012  . Hammertoe 09/02/2012  . History of bilateral hip replacements 09/02/2012  . History of hysterectomy   . Hypertension   . Hypertension   . Hypokalemia   . Inability to ambulate due to hip 10/12/2015  . Mandibular dysfunction   . Onychomycosis 09/02/2012  . Osteoarthritis   . Polycythemia vera ([place])   . Presence of IVC filter 05/25/2015  . 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 Urinary incontinence in female 09/02/2016   Family History  Problem Relation Age of Onset  . Brain cancer Mother   . Heart attack Father   . COPD Father   . Coronary artery disease Father   . Hypertension Other   . Arthritis-Osteo Other   . Diabetes Brother   . Arthritis Brother   . Osteosarcoma Son   . Bone cancer Son   . Arthritis Sister   . Arthritis Brother   . Hypertension Brother   . Coronary artery  disease Brother   . Malignant hyperthermia Brother    Past Surgical History:  Procedure Laterality Date  . ABDOMINAL HYSTERECTOMY    . BLEPHAROPLASTY Right 03/2017   right upper eyelid  . CARDIAC CATHETERIZATION Right 04/03/2015   Procedure: CENTRAL LINE INSERTION;  Surgeon: Sherri Rad, MD;  Location: [facility] ORS;  Service: General;  Laterality: Right;  . COLECTOMY WITH COLOSTOMY CREATION/HARTMANN PROCEDURE N/A 04/03/2015   Procedure: COLECTOMY WITH COLOSTOMY CREATION/HARTMANN PROCEDURE;  Surgeon: Sherri Rad, MD;  Location: [facility] ORS;  Service: General;  Laterality: N/A;  . COLON SURGERY    . FOOT AMPUTATION     partial  . FOOT AMPUTATION Right   . IVC FILTER INSERTION    . JOINT REPLACEMENT     Left and Right Hip  . PERIPHERAL VASCULAR CATHETERIZATION N/A 04/02/2015   Procedure: Visceral Angiography;  Surgeon: Algernon Huxley, MD;  Location: [place] CV [facility];  Service: Cardiovascular;  Laterality: N/A;  . PERIPHERAL VASCULAR CATHETERIZATION N/A 04/02/2015   Procedure: Visceral Artery Intervention;  Surgeon: Algernon Huxley, MD;  Location: [place] [facility];  Service: Cardiovascular;  Laterality: N/A;  . PERIPHERAL VASCULAR CATHETERIZATION N/A 05/25/2015   Procedure: IVC Filter Insertion;  Surgeon: Katha Cabal, MD;  Location: [place] [facility];  Service: Cardiovascular;  Laterality: N/A;  . TOE AMPUTATION     right  .  TOTAL HIP ARTHROPLASTY     Patient Active Problem List   Diagnosis Date Noted  . SOB (shortness of breath) 10/28/2018  . Shortness of breath 09/27/2018  . COPD exacerbation ([place]) 09/22/2018  . Sepsis ([place]) 09/22/2018  . Lower GI bleed 09/09/2018  . AMS (altered mental status) 08/30/2018  . Pressure injury of skin 08/13/2018  . B12 deficiency 08/04/2018  . Anemia 06/07/2018  . Secondary myelofibrosis ([place]) 02/17/2018  . Goals of care, counseling/discussion 12/23/2017  . Palliative care by specialist   . DNR (do not resuscitate) discussion   . Weakness generalized    . Chronic diastolic CHF (congestive heart failure) ([place]) 07/28/2017  . Vascular dementia without behavioral disturbance ([place]) 07/08/2017  . Influenza with respiratory manifestation 12/01/2016  . Respiratory distress 11/23/2016  . Urinary incontinence in female 09/02/2016  . Vision loss of right eye 08/26/2016  . Blindness of right eye 08/26/2016  . Inability to ambulate due to hip 10/12/2015  . Colostomy in place [facility]) 10/04/2015  . Long term current use of anticoagulant therapy 10/03/2015  . Leukocytosis 09/25/2015  . CAP (community acquired pneumonia) 09/24/2015  . COPD (chronic obstructive pulmonary disease) ([place]) 09/24/2015  . UTI (urinary tract infection) 07/16/2015  . Abdominal wall cellulitis 07/15/2015  . DVT (deep venous thrombosis) ([place]) 07/15/2015  . HTN (hypertension) 07/15/2015  . Polycythemia vera ([place]) 07/15/2015  . Arthritis 07/15/2015  . Pulmonary emboli ([place]) 05/25/2015  . Diverticulosis of colon with hemorrhage 04/02/2015  . Diverticulosis of intestine with bleeding 04/02/2015  . Fall 01/10/2015  . Osteoarthritis 01/10/2015  . Hammertoe 09/02/2012  . S/P transmetatarsal amputation of foot ([place]) 09/02/2012  . Onychomycosis 09/02/2012  . Other specified dermatoses 09/02/2012  . S/P hip replacement 08/23/2012  . Glenohumeral arthritis 06/28/2012      Prior to Admission medications   Medication Sig Start Date End Date Taking? Authorizing Provider  betamethasone valerate ointment (VALISONE) 0.1 % Apply 1 application topically 2 (two) times daily. 11/07/18 11/07/19 Yes [provider]  amLODipine (NORVASC) 5 MG tablet Take 2 tablets (10 mg total) by mouth daily. 10/31/18   Fritzi Mandes, MD  cefdinir (OMNICEF) 300 MG capsule Take 1 capsule (300 mg total) by mouth every 12 (twelve) hours. 10/31/18   Fritzi Mandes, MD  cholecalciferol (VITAMIN D) 1000 units tablet Take 1,000 Units by mouth daily.    [provider]  ELIQUIS 5 MG TABS tablet TAKE ONE  TABLET BY MOUTH TWICE A DAY 11/04/18   Karen Kitchens, NP  estradiol (ESTRACE VAGINAL) 0.1 MG/GM vaginal cream Apply 0.5mg  (pea-sized amount)  just inside the vaginal introitus with a finger-tip on Monday, Wednesday and Friday nights. 08/09/18   Zara Council A, PA-C  folic acid (FOLVITE) 976 MCG tablet Take 400 mcg by mouth daily.    [provider]  ipratropium-albuterol (DUONEB) 0.5-2.5 (3) MG/3ML SOLN Take 3 mLs by nebulization every 4 (four) hours as needed (for shortness of breath/wheezing).     [provider]  JAKAFI 15 MG tablet TAKE 1 TABLET (15 MG TOTAL) BY MOUTH 2 TIMES DAILY. 10/28/18   Karen Kitchens, NP  pantoprazole (PROTONIX) 40 MG tablet Take 1 tablet (40 mg total) by mouth daily. 09/12/18   [name], MD    Allergies Patient has no known allergies.    Social History Social History   Tobacco Use  . Smoking status: Never Smoker  . Smokeless tobacco: Never Used  Substance Use Topics  . Alcohol use: No  . Drug use:  No    Review of Systems Patient denies headaches, rhinorrhea, blurry vision, numbness, shortness of breath, chest pain, edema, cough, abdominal pain, nausea, vomiting, diarrhea, dysuria, fevers, rashes or hallucinations unless otherwise stated above in HPI. ____________________________________________   PHYSICAL EXAM:  VITAL SIGNS: Vitals:   11/08/18 1300 11/08/18 1315  BP: (!) 151/78   Pulse: 74 79  Resp: (!) 30 (!) 23  Temp:    SpO2: 94% (!) 86%    Constitutional: Alert but chronically ill appearing in mild resp distress  Eyes: Conjunctivae are normal.  Head: Atraumatic. Nose: No congestion/rhinnorhea. Mouth/Throat: Mucous membranes are moist.   Neck: No stridor. Painless ROM.  Cardiovascular: Normal rate, regular rhythm. Grossly normal heart sounds.  Good peripheral circulation. Respiratory: Normal respiratory effort.  No retractions. Lungs with diminished BS bilaterally with faint end expiratory  wheeze Gastrointestinal: Soft and nontender. No distention. No abdominal bruits. No CVA tenderness. Genitourinary:  Musculoskeletal: No lower extremity tenderness nor edema.  No joint effusions. Neurologic:  Normal speech and language. No gross focal neurologic deficits are appreciated. No facial droop Skin:  Skin is warm, dry and intact. No rash noted. Psychiatric: Mood and affect are normal. Speech and behavior are normal.  ____________________________________________   LABS (all labs ordered are listed, but only abnormal results are displayed)  Results for orders placed or performed during the hospital encounter of 11/08/18 (from the past 24 hour(s))  CBC with Differential/Platelet     Status: Abnormal   Collection Time: 11/08/18 10:43 AM  Result Value Ref Range   WBC 5.6 4.0 - 10.5 K/uL   RBC 3.56 (L) 3.87 - 5.11 MIL/uL   Hemoglobin 8.3 (L) 12.0 - 15.0 g/dL   HCT 29.1 (L) 36.0 - 46.0 %   MCV 81.7 80.0 - 100.0 fL   MCH 23.3 (L) 26.0 - 34.0 pg   MCHC 28.5 (L) 30.0 - 36.0 g/dL   RDW 31.1 (H) 11.5 - 15.5 %   Platelets 217 150 - 400 K/uL   nRBC 1.6 (H) 0.0 - 0.2 %   Neutrophils Relative % 72 %   Neutro Abs 4.0 1.7 - 7.7 K/uL   Lymphocytes Relative 15 %   Lymphs Abs 0.9 0.7 - 4.0 K/uL   Monocytes Relative 3 %   Monocytes Absolute 0.2 0.1 - 1.0 K/uL   Eosinophils Relative 2 %   Eosinophils Absolute 0.1 0.0 - 0.5 K/uL   Basophils Relative 0 %   Basophils Absolute 0.0 0.0 - 0.1 K/uL   WBC Morphology MILD LEFT SHIFT (1-5% METAS, OCC MYELO, OCC BANDS)    Smear Review MORPHOLOGY UNREMARKABLE    Immature Granulocytes 8 %   Abs Immature Granulocytes 0.47 (H) 0.00 - 0.07 K/uL   Schistocytes PRESENT    Target Cells PRESENT    Ovalocytes PRESENT   Comprehensive metabolic panel     Status: Abnormal   Collection Time: 11/08/18 10:43 AM  Result Value Ref Range   Sodium 139 135 - 145 mmol/L   Potassium 3.9 3.5 - 5.1 mmol/L   Chloride 104 98 - 111 mmol/L   CO2 29 22 - 32 mmol/L    Glucose, Bld 94 70 - 99 mg/dL   BUN 10 8 - 23 mg/dL   Creatinine, Ser 0.45 0.44 - 1.00 mg/dL   Calcium 8.7 (L) 8.9 - 10.3 mg/dL   Total Protein 6.1 (L) 6.5 - 8.1 g/dL   Albumin 2.6 (L) 3.5 - 5.0 g/dL   AST 11 (L) 15 - 41 U/L  ALT 7 0 - 44 U/L   Alkaline Phosphatase 56 38 - 126 U/L   Total Bilirubin 0.6 0.3 - 1.2 mg/dL   GFR calc non Af Amer >60 >60 mL/min   GFR calc Af Amer >60 >60 mL/min   Anion gap 6 5 - 15  Troponin I - ONCE - STAT     Status: Abnormal   Collection Time: 11/08/18 10:43 AM  Result Value Ref Range   Troponin I 0.03 (HH) <0.03 ng/mL  Influenza panel by PCR (type A & B)     Status: None   Collection Time: 11/08/18 12:32 PM  Result Value Ref Range   Influenza A By PCR NEGATIVE NEGATIVE   Influenza B By PCR NEGATIVE NEGATIVE   ____________________________________________  EKG My review and personal interpretation at Time: 10:24   Indication: sob  Rate: 70  Rhythm: sinus Axis: left Other: nonspecific st abn, no stemi ____________________________________________  RADIOLOGY  I personally reviewed all radiographic images ordered to evaluate for the above acute complaints and reviewed radiology reports and findings.  These findings were personally discussed with the patient.  Please see medical record for radiology report.  ____________________________________________   PROCEDURES  Procedure(s) performed:  Procedures    Critical Care performed: no ____________________________________________   INITIAL IMPRESSION / ASSESSMENT AND PLAN / ED COURSE  Pertinent labs & imaging results that were available during my care of the patient were reviewed by me and considered in my medical decision making (see chart for details).   DDX: Asthma, copd, CHF, pna, ptx, malignancy, Pe, anemia   Jaaliyah Curling is a 84 y.o. who presents to the ED with shortness of breath as described above.  Doubt PE.  Patient already on Eliquis.  Possible viral pneumonia or pneumonia.   Blood will be sent for above differential.  Will give bronchodilators as well as steroids.  Clinical Course as of Nov 08 1345  Mon Nov 08, 2018  1327 Patient with worsening shortness of breath with evidence of acute respiratory failure on chronic respiratory failure with hypoxia.  Still awaiting flu swab.   [PR]  8101 High suspicion for bronchitis given her underlying COPD.   [PR]  1340 Flu is negative.  She has bronchodilator responsive.  Based on her shortness of breath with increasing O2 requirement I do believe patient will require hospitalization for serial nebs.  Does not seem clinically consistent with PE acute anemia or pneumonia.   [PR]    Clinical Course User Index [PR] Merlyn Lot, MD     As part of my medical decision making, I reviewed the following data within the [place] notes reviewed and incorporated, Labs reviewed, notes from prior ED visits .   ____________________________________________   FINAL CLINICAL IMPRESSION(S) / ED DIAGNOSES  Final diagnoses:  Acute on chronic respiratory failure with hypoxia (HCC)  COPD exacerbation (HCC)      NEW MEDICATIONS STARTED DURING THIS VISIT:  New Prescriptions   No medications on file     Note:  This document was prepared using Dragon voice recognition software and may include unintentional dictation errors.    Merlyn Lot, MD 11/08/18 1346

## 2018-11-08 NOTE — ED Triage Notes (Signed)
Pt presents to ED via [facility] from home c/o SOB and cough starting today with generalized body aches. On 2L O2 chronically.

## 2018-11-08 NOTE — Progress Notes (Signed)
Advanced care plan.  Purpose of the Encounter: CODE STATUS  Parties in Attendance: Patient herself  Patient's Decision Capacity: Intact  Subjective/Patient's story:  Patient is a 84 year old African-American female with known history of pulmonary embolism, diabetes type 2, hypertension who is presenting with shortness of breath  Objective/Medical story  I discussed and confirmed with patient's desires for cardiac and pulmonary resuscitation patient has a previous DNR in place  Goals of care determination:  Patient states that she would not want to be resuscitated continue DNR status Also patient may benefit from palliative care consult with hospice at home has had multiple readmissions  CODE STATUS: DNR   Time spent discussing advanced care planning: 16 minutes

## 2018-11-08 NOTE — H&P (Signed)
[facility] NAME: Yolanda Wells    MR#:  [id number]  DATE OF BIRTH:  [birth year]  DATE OF ADMISSION:  11/08/2018  PRIMARY CARE PHYSICIAN: [name], MD   REQUESTING/REFERRING PHYSICIAN: Merlyn Lot, MD  CHIEF COMPLAINT:   Chief Complaint  Patient presents with  . Shortness of Breath    HISTORY OF PRESENT ILLNESS: Yolanda Wells  is a 84 y.o. female with a known history of asthma, DVT, history of pulmonary embolism, congestive heart failure, hypertension, and diabetes who has recurrent admissions to the hospital with shortness of breath.  Patient was recently hospitalized discharge 6 days ago.  Who is presenting to the emergency room with complaint of shortness of breath and cough.  Patient in the ER was noted to have some hypoxia with sats dropping to 86.  Were asked to admit the patient.  Patient states that her breathing got worse today and she got more short of breath.  She is on chronic 2 L oxygen but had to bump it up to 3 L.  She also states that she may have gotten anxious because the girl that was supposed to show up with did not show up today.  PAST MEDICAL HISTORY:   Past Medical History:  Diagnosis Date  . Acute deep vein thrombosis (DVT) of distal vein of left lower extremity ([place]) 10/03/2015  . Acute pulmonary embolism ([place]) 10/03/2015  . Anticoagulant long-term use   . Arthritis   . Arthritis   . CHF (congestive heart failure) ([place])   . Chronic anticoagulation 10/03/2015  . Collagen vascular disease ([place])   . Colostomy in place [facility]) 10/04/2015  . Deep venous thrombosis (HCC)    right lower extremity  . Dependent edema   . Diverticulosis of intestine with bleeding 04/02/2015  . Glenohumeral arthritis 06/28/2012  . Hammertoe 09/02/2012  . History of bilateral hip replacements 09/02/2012  . History of hysterectomy   . Hypertension   . Hypertension   . Hypokalemia   . Inability to ambulate due to hip  10/12/2015  . Mandibular dysfunction   . Onychomycosis 09/02/2012  . Osteoarthritis   . Polycythemia vera ([place])   . Presence of IVC filter 05/25/2015  . 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 Urinary incontinence in female 09/02/2016    PAST SURGICAL HISTORY:  Past Surgical History:  Procedure Laterality Date  . ABDOMINAL HYSTERECTOMY    . BLEPHAROPLASTY Right 03/2017   right upper eyelid  . CARDIAC CATHETERIZATION Right 04/03/2015   Procedure: CENTRAL LINE INSERTION;  Surgeon: Sherri Rad, MD;  Location: [facility] ORS;  Service: General;  Laterality: Right;  . COLECTOMY WITH COLOSTOMY CREATION/HARTMANN PROCEDURE N/A 04/03/2015   Procedure: COLECTOMY WITH COLOSTOMY CREATION/HARTMANN PROCEDURE;  Surgeon: Sherri Rad, MD;  Location: [facility] ORS;  Service: General;  Laterality: N/A;  . COLON SURGERY    . FOOT AMPUTATION     partial  . FOOT AMPUTATION Right   . IVC FILTER INSERTION    . JOINT REPLACEMENT     Left and Right Hip  . PERIPHERAL VASCULAR CATHETERIZATION N/A 04/02/2015   Procedure: Visceral Angiography;  Surgeon: Algernon Huxley, MD;  Location: [place];  Service: Cardiovascular;  Laterality: N/A;  . PERIPHERAL VASCULAR CATHETERIZATION N/A 04/02/2015   Procedure: Visceral Artery Intervention;  Surgeon: Algernon Huxley, MD;  Location: [place] [facility];  Service: Cardiovascular;  Laterality: N/A;  . PERIPHERAL VASCULAR CATHETERIZATION N/A 05/25/2015   Procedure: IVC Filter  Insertion;  Surgeon: Katha Cabal, MD;  Location: [place] [facility];  Service: Cardiovascular;  Laterality: N/A;  . TOE AMPUTATION     right  . TOTAL HIP ARTHROPLASTY      SOCIAL HISTORY:  Social History   Tobacco Use  . Smoking status: Never Smoker  . Smokeless tobacco: Never Used  Substance Use Topics  . Alcohol use: No    FAMILY HISTORY:  Family History  Problem Relation Age of Onset  . Brain cancer Mother   . Heart attack Father   . COPD Father   . Coronary artery disease Father   .  Hypertension Other   . Arthritis-Osteo Other   . Diabetes Brother   . Arthritis Brother   . Osteosarcoma Son   . Bone cancer Son   . Arthritis Sister   . Arthritis Brother   . Hypertension Brother   . Coronary artery disease Brother   . Malignant hyperthermia Brother     DRUG ALLERGIES: No Known Allergies  REVIEW OF SYSTEMS:   CONSTITUTIONAL: No fever, fatigue or weakness.  EYES: No blurred or double vision.  EARS, NOSE, AND THROAT: No tinnitus or ear pain.  RESPIRATORY: No cough, positive shortness of breath, no wheezing or hemoptysis.  CARDIOVASCULAR: No chest pain, orthopnea, edema.  GASTROINTESTINAL: No nausea, vomiting, diarrhea or abdominal pain.  GENITOURINARY: No dysuria, hematuria.  ENDOCRINE: No polyuria, nocturia,  HEMATOLOGY: No anemia, easy bruising or bleeding SKIN: No rash or lesion. MUSCULOSKELETAL: No joint pain or arthritis.   NEUROLOGIC: No tingling, numbness, weakness.  PSYCHIATRY: No anxiety or depression.   MEDICATIONS AT HOME:  Prior to Admission medications   Medication Sig Start Date End Date Taking? Authorizing Provider  betamethasone valerate ointment (VALISONE) 0.1 % Apply 1 application topically 2 (two) times daily. 11/07/18 11/07/19 Yes [provider]  amLODipine (NORVASC) 5 MG tablet Take 2 tablets (10 mg total) by mouth daily. 10/31/18   Fritzi Mandes, MD  cefdinir (OMNICEF) 300 MG capsule Take 1 capsule (300 mg total) by mouth every 12 (twelve) hours. 10/31/18   Fritzi Mandes, MD  cholecalciferol (VITAMIN D) 1000 units tablet Take 1,000 Units by mouth daily.    [provider]  ELIQUIS 5 MG TABS tablet TAKE ONE TABLET BY MOUTH TWICE A DAY 11/04/18   Karen Kitchens, NP  estradiol (ESTRACE VAGINAL) 0.1 MG/GM vaginal cream Apply 0.5mg  (pea-sized amount)  just inside the vaginal introitus with a finger-tip on Monday, Wednesday and Friday nights. 08/09/18   Zara Council A, PA-C  folic acid (FOLVITE) 191 MCG tablet Take 400 mcg by  mouth daily.    [provider]  ipratropium-albuterol (DUONEB) 0.5-2.5 (3) MG/3ML SOLN Take 3 mLs by nebulization every 4 (four) hours as needed (for shortness of breath/wheezing).     [provider]  JAKAFI 15 MG tablet TAKE 1 TABLET (15 MG TOTAL) BY MOUTH 2 TIMES DAILY. 10/28/18   Karen Kitchens, NP  pantoprazole (PROTONIX) 40 MG tablet Take 1 tablet (40 mg total) by mouth daily. 09/12/18   [name], MD      PHYSICAL EXAMINATION:   VITAL SIGNS: Blood pressure (!) 144/74, pulse 80, temperature 98 F (36.7 C), temperature source Oral, resp. rate 18, height 5\' 7"  (1.702 m), weight 91.6 kg, SpO2 100 %.  GENERAL:  84 y.o.-year-old patient lying in the bed with no acute distress.  EYES: Pupils equal, round, reactive to light and accommodation. No scleral icterus. Extraocular muscles intact.  HEENT: Head atraumatic,  normocephalic. Oropharynx and nasopharynx clear.  NECK:  Supple, no jugular venous distention. No thyroid enlargement, no tenderness.  LUNGS: Bilateral wheezing without any accessory muscle usage.  CARDIOVASCULAR: S1, S2 normal. No murmurs, rubs, or gallops.  ABDOMEN: Soft, nontender, nondistended. Bowel sounds present. No organomegaly or mass.  Colostomy bag in place EXTREMITIES: No pedal edema, cyanosis, or clubbing.  NEUROLOGIC: Cranial nerves II through XII are intact. Muscle strength 5/5 in all extremities. Sensation intact. Gait not checked.  PSYCHIATRIC: The patient is alert and oriented x 3.  SKIN: No obvious rash, lesion, or ulcer.   LABORATORY PANEL:   CBC Recent Labs  Lab 11/08/18 1043  WBC 5.6  HGB 8.3*  HCT 29.1*  PLT 217  MCV 81.7  MCH 23.3*  MCHC 28.5*  RDW 31.1*  LYMPHSABS 0.9  MONOABS 0.2  EOSABS 0.1  BASOSABS 0.0   ------------------------------------------------------------------------------------------------------------------  Chemistries  Recent Labs  Lab 11/08/18 1043  NA 139  K 3.9  CL 104  CO2 29  GLUCOSE  94  BUN 10  CREATININE 0.45  CALCIUM 8.7*  AST 11*  ALT 7  ALKPHOS 56  BILITOT 0.6   ------------------------------------------------------------------------------------------------------------------ estimated creatinine clearance is 60.8 mL/min (by C-G formula based on SCr of 0.45 mg/dL). ------------------------------------------------------------------------------------------------------------------ No results for input(s): TSH, T4TOTAL, T3FREE, THYROIDAB in the last 72 hours.  Invalid input(s): FREET3   Coagulation profile No results for input(s): INR, PROTIME in the last 168 hours. ------------------------------------------------------------------------------------------------------------------- No results for input(s): DDIMER in the last 72 hours. -------------------------------------------------------------------------------------------------------------------  Cardiac Enzymes Recent Labs  Lab 11/08/18 1043  TROPONINI 0.03*   ------------------------------------------------------------------------------------------------------------------ Invalid input(s): POCBNP  ---------------------------------------------------------------------------------------------------------------  Urinalysis    Component Value Date/Time   COLORURINE YELLOW (A) 10/28/2018 0829   APPEARANCEUR CLOUDY (A) 10/28/2018 0829   APPEARANCEUR Hazy 07/08/2014 0956   LABSPEC 1.017 10/28/2018 0829   LABSPEC 1.019 07/08/2014 0956   PHURINE 6.0 10/28/2018 0829   GLUCOSEU NEGATIVE 10/28/2018 0829   GLUCOSEU Negative 07/08/2014 0956   HGBUR SMALL (A) 10/28/2018 0829   BILIRUBINUR NEGATIVE 10/28/2018 0829   BILIRUBINUR Negative 07/08/2014 0956   KETONESUR NEGATIVE 10/28/2018 0829   PROTEINUR 30 (A) 10/28/2018 0829   NITRITE POSITIVE (A) 10/28/2018 0829   LEUKOCYTESUR LARGE (A) 10/28/2018 0829   LEUKOCYTESUR 1+ 07/08/2014 0956     RADIOLOGY: Dg Chest Portable 1 View  Result Date:  11/08/2018 CLINICAL DATA:  84 year old female with shortness of breath and cough today. Body aches. EXAM: PORTABLE CHEST 1 VIEW COMPARISON:  Portable chest 10/28/2018 and earlier. FINDINGS: Portable AP semi upright view at 1057 hours. Chronic cardiomegaly, tortuous thoracic aorta and central pulmonary artery enlargement. Chronic elevation of the right hemidiaphragm with right lung base atelectasis. No pneumothorax, pulmonary edema, pleural effusion or acute pulmonary opacity identified. Visualized tracheal air column is within normal limits. Advanced degenerative changes at both shoulders. Dextroconvex thoracic scoliosis. No acute osseous abnormality identified. IMPRESSION: 1.  No acute cardiopulmonary abnormality. 2. Chronic cardiomegaly, central pulmonary artery enlargement, tortuous aorta, and elevated right hemidiaphragm. Electronically Signed   By: Genevie Ann M.D.   On: 11/08/2018 11:08    EKG: Orders placed or performed during the hospital encounter of 11/08/18  . ED EKG  . ED EKG  . EKG 12-Lead  . EKG 12-Lead    IMPRESSION AND PLAN: 1. Shortness of breath suspect due to asthma exacerbation we will treat with nebulizer therapy as well as IV Solu-Medrol  2. Diabetes type 2 we will continue her home regimen Place on sliding scale insulin  3.  Essential hypertension continue therapy with Norvasc  4.  GERD we will continue Protonix  5  Miscellaneous continue Eliquis for DVT prophylaxis   Failure to thrive I will ask palliative care team to see with recurrent admission All the records are reviewed and case discussed with ED provider. Management plans discussed with the patient, family and they are in agreement.  CODE STATUS: Code Status History    Date Active Date Inactive Code Status Order ID Comments User Context   10/28/2018 1334 10/31/2018 1621 DNR [id number]  Dustin Flock, MD Inpatient   09/27/2018 1901 09/30/2018 1545 DNR [id number]  Loletha Grayer, MD ED   09/22/2018 0133  09/25/2018 2201 Full Code [id number]  Amelia Jo, MD Inpatient   09/09/2018 1240 09/12/2018 2008 Full Code [id number]  Loletha Grayer, MD ED   08/30/2018 2018 09/01/2018 2004 DNR [id number]  Nicholes Mango, MD ED   08/30/2018 1903 08/30/2018 2018 Full Code [id number]  Nicholes Mango, MD ED   08/13/2018 1038 08/15/2018 1836 Full Code [id number]  Loletha Grayer, MD Inpatient   08/12/2018 2001 08/13/2018 1038 DNR [id number]  Gorden Harms, MD ED   08/12/2018 2001 08/12/2018 2001 Full Code [id number]  Salary, Avel Peace, MD ED   06/15/2018 1635 06/18/2018 2009 Full Code [id number]  [name], MD Inpatient   02/24/2018 0347 02/27/2018 0027 Full Code [id number]  Amelia Jo, MD Inpatient   10/15/2017 1141 10/16/2017 2204 Full Code [id number]  Knox Royalty, NP Inpatient   10/13/2017 2246 10/15/2017 1141 DNR [id number]  Lance Coon, MD ED   10/06/2017 1454 10/09/2017 2058 DNR [id number]  Nicholes Mango, MD Inpatient   10/06/2017 1111 10/06/2017 1453 Full Code [id number]  Nicholes Mango, MD Inpatient   05/27/2017 1954 05/28/2017 2013 Full Code [id number]  Vaughan Basta, MD Inpatient   05/23/2017 2322 05/26/2017 1614 Full Code [id number]  Lance Coon, MD Inpatient   04/10/2017 0325 04/11/2017 1916 Full Code [id number]  Harrie Foreman, MD Inpatient   11/23/2016 0629 12/01/2016 1935 Full Code [id number]  Harrie Foreman, MD Inpatient   08/26/2016 0445 08/26/2016 1923 Full Code [id number]  Saundra Shelling, MD Inpatient   09/22/2015 1755 09/25/2015 1844 Full Code [id number]  Demetrios Loll, MD ED   09/15/2015 0341 09/17/2015 1741 Full Code [id number]  Harrie Foreman, MD Inpatient   07/16/2015 0151 07/20/2015 2334 Full Code [id number]  Lance Coon, MD Inpatient   05/25/2015 0834 05/29/2015 1616 Full Code [id number]  Harrie Foreman, MD Inpatient   04/02/2015 0606 04/08/2015 1651 Full Code [id number]  Harrie Foreman, MD Inpatient    Questions for Most Recent Historical Code Status (Order [id number])    Question Answer  Comment   In the event of cardiac or respiratory ARREST Do not call a "code blue"    In the event of cardiac or respiratory ARREST Do not perform Intubation, CPR, defibrillation or ACLS    In the event of cardiac or respiratory ARREST Use medication by any route, position, wound care, and other measures to relive pain and suffering. May use oxygen, suction and manual treatment of airway obstruction as needed for comfort.    Comments nurse may pronounce        TOTAL TIME TAKING CARE OF THIS PATIENT: 69minutes.    Dustin Flock M.D on 11/08/2018 at 2:12 PM  Between 7am to 6pm - Pager - [phone]  After 6pm go to [email or web address] - password [email or web address] [facility]  [phone]  CC: Primary care physician; [name], MD

## 2018-11-08 NOTE — ED Notes (Signed)
Pt sleeping, found to be 86% RA.  MD notified.

## 2018-11-09 DIAGNOSIS — Z7901 Long term (current) use of anticoagulants: Secondary | ICD-10-CM | POA: Diagnosis not present

## 2018-11-09 DIAGNOSIS — I110 Hypertensive heart disease with heart failure: Secondary | ICD-10-CM | POA: Diagnosis present

## 2018-11-09 DIAGNOSIS — J441 Chronic obstructive pulmonary disease with (acute) exacerbation: Secondary | ICD-10-CM | POA: Diagnosis present

## 2018-11-09 DIAGNOSIS — Z89431 Acquired absence of right foot: Secondary | ICD-10-CM | POA: Diagnosis not present

## 2018-11-09 DIAGNOSIS — Z8249 Family history of ischemic heart disease and other diseases of the circulatory system: Secondary | ICD-10-CM | POA: Diagnosis not present

## 2018-11-09 DIAGNOSIS — Z833 Family history of diabetes mellitus: Secondary | ICD-10-CM | POA: Diagnosis not present

## 2018-11-09 DIAGNOSIS — Z86718 Personal history of other venous thrombosis and embolism: Secondary | ICD-10-CM | POA: Diagnosis not present

## 2018-11-09 DIAGNOSIS — J440 Chronic obstructive pulmonary disease with acute lower respiratory infection: Secondary | ICD-10-CM | POA: Diagnosis present

## 2018-11-09 DIAGNOSIS — Z86711 Personal history of pulmonary embolism: Secondary | ICD-10-CM | POA: Diagnosis not present

## 2018-11-09 DIAGNOSIS — Z8673 Personal history of transient ischemic attack (TIA), and cerebral infarction without residual deficits: Secondary | ICD-10-CM | POA: Diagnosis not present

## 2018-11-09 DIAGNOSIS — Z96643 Presence of artificial hip joint, bilateral: Secondary | ICD-10-CM | POA: Diagnosis present

## 2018-11-09 DIAGNOSIS — Z7189 Other specified counseling: Secondary | ICD-10-CM | POA: Diagnosis not present

## 2018-11-09 DIAGNOSIS — Z515 Encounter for palliative care: Secondary | ICD-10-CM

## 2018-11-09 DIAGNOSIS — Z95828 Presence of other vascular implants and grafts: Secondary | ICD-10-CM | POA: Diagnosis not present

## 2018-11-09 DIAGNOSIS — E119 Type 2 diabetes mellitus without complications: Secondary | ICD-10-CM | POA: Diagnosis present

## 2018-11-09 DIAGNOSIS — I5032 Chronic diastolic (congestive) heart failure: Secondary | ICD-10-CM | POA: Diagnosis present

## 2018-11-09 DIAGNOSIS — J9621 Acute and chronic respiratory failure with hypoxia: Secondary | ICD-10-CM | POA: Diagnosis present

## 2018-11-09 DIAGNOSIS — J45901 Unspecified asthma with (acute) exacerbation: Secondary | ICD-10-CM | POA: Diagnosis present

## 2018-11-09 DIAGNOSIS — R0602 Shortness of breath: Secondary | ICD-10-CM

## 2018-11-09 DIAGNOSIS — Z90710 Acquired absence of both cervix and uterus: Secondary | ICD-10-CM | POA: Diagnosis not present

## 2018-11-09 DIAGNOSIS — Z9981 Dependence on supplemental oxygen: Secondary | ICD-10-CM | POA: Diagnosis not present

## 2018-11-09 DIAGNOSIS — Z825 Family history of asthma and other chronic lower respiratory diseases: Secondary | ICD-10-CM | POA: Diagnosis not present

## 2018-11-09 DIAGNOSIS — K219 Gastro-esophageal reflux disease without esophagitis: Secondary | ICD-10-CM | POA: Diagnosis present

## 2018-11-09 DIAGNOSIS — Z66 Do not resuscitate: Secondary | ICD-10-CM | POA: Diagnosis present

## 2018-11-09 DIAGNOSIS — Z933 Colostomy status: Secondary | ICD-10-CM | POA: Diagnosis not present

## 2018-11-09 DIAGNOSIS — R627 Adult failure to thrive: Secondary | ICD-10-CM | POA: Diagnosis present

## 2018-11-09 LAB — CBC
HCT: 28.3 % — ABNORMAL LOW (ref 36.0–46.0)
Hemoglobin: 8.2 g/dL — ABNORMAL LOW (ref 12.0–15.0)
MCH: 23.7 pg — ABNORMAL LOW (ref 26.0–34.0)
MCHC: 29 g/dL — AB (ref 30.0–36.0)
MCV: 81.8 fL (ref 80.0–100.0)
Platelets: 209 10*3/uL (ref 150–400)
RBC: 3.46 MIL/uL — ABNORMAL LOW (ref 3.87–5.11)
RDW: 30.6 % — ABNORMAL HIGH (ref 11.5–15.5)
WBC: 10.2 10*3/uL (ref 4.0–10.5)
nRBC: 1.4 % — ABNORMAL HIGH (ref 0.0–0.2)

## 2018-11-09 LAB — BASIC METABOLIC PANEL
Anion gap: 7 (ref 5–15)
BUN: 12 mg/dL (ref 8–23)
CO2: 28 mmol/L (ref 22–32)
Calcium: 8.6 mg/dL — ABNORMAL LOW (ref 8.9–10.3)
Chloride: 106 mmol/L (ref 98–111)
Creatinine, Ser: 0.59 mg/dL (ref 0.44–1.00)
GFR calc non Af Amer: >60 mL/min (ref >60)
Glucose, Bld: 183 mg/dL — ABNORMAL HIGH (ref 70–99)
Potassium: 4.5 mmol/L (ref 3.5–5.1)
Sodium: 141 mmol/L (ref 135–145)

## 2018-11-09 LAB — GLUCOSE, CAPILLARY
Glucose-Capillary: 146 mg/dL — ABNORMAL HIGH (ref 70–99)
Glucose-Capillary: 156 mg/dL — ABNORMAL HIGH (ref 70–99)
Glucose-Capillary: 159 mg/dL — ABNORMAL HIGH (ref 70–99)

## 2018-11-09 MED ORDER — IPRATROPIUM-ALBUTEROL 0.5-2.5 (3) MG/3ML IN SOLN
3.0000 mL | Freq: Three times a day (TID) | RESPIRATORY_TRACT | Status: DC
  Administered 2018-11-09 – 2018-11-10 (×3): 3 mL via RESPIRATORY_TRACT
  Filled 2018-11-09 (×3): qty 3

## 2018-11-09 MED ORDER — INSULIN ASPART 100 UNIT/ML ~~LOC~~ SOLN
0.0000 [IU] | Freq: Three times a day (TID) | SUBCUTANEOUS | Status: DC
  Administered 2018-11-09: 1 [IU] via SUBCUTANEOUS
  Administered 2018-11-09: 2 [IU] via SUBCUTANEOUS
  Administered 2018-11-10: 1 [IU] via SUBCUTANEOUS
  Filled 2018-11-09 (×3): qty 1

## 2018-11-09 MED ORDER — ALBUTEROL SULFATE (2.5 MG/3ML) 0.083% IN NEBU: 2.5000 mg | INHALATION_SOLUTION | RESPIRATORY_TRACT | Status: DC | PRN

## 2018-11-09 MED ORDER — INSULIN ASPART 100 UNIT/ML ~~LOC~~ SOLN: 0.0000 [IU] | Freq: Every day | SUBCUTANEOUS | Status: DC

## 2018-11-09 NOTE — Consult Note (Signed)
Consultation Note Date: 11/09/2018   Patient Name: Yolanda Wells  DOB: 1932/11/08  MRN: [id number]  Age / Sex: 84 y.o., female  PCP: [name], MD Referring Physician: Hillary Bow, MD  Reason for Consultation: Establishing goals of care  HPI/Patient Profile: 84 y.o. female  with past medical history of possible COPD from secondary smoke?, DVT, h/o pulmonary embolism, diastolic heart failure, hypertension, diabetes, vascular dementia admitted on 11/08/2018 with recurrent SOB.   Clinical Assessment and Goals of Care: I spent some time reviewing past notes and admissions as well as palliative visits in [facility] system.   I met with Yolanda Wells who is pleasant but has some overall confusion. She knows that she is in the hospital and tells me that she does believe that she became anxious at home when her aide did not show up when anticipated. She did not believe she needed to come to the hospital but is glad that she did come. She would want to come back to the hospital and feels safe at the hospital.   I called and spoke with her HCPOA/granddaughter, Yolanda Wells. Yolanda Wells shares with me her frustration that her grandmother's decline and readmission. She does not feel that they have had any benefits from hospice and feels that her grandmother is getting overlooked since she is on hospice. She also feels she is discharged too early. They want readmission and rather aggressive care. Yolanda Wells acknowledges that her grandmother does not have long to live but they are desiring that she live as long as she can and with the best quality. Yolanda Wells wants to restart physical therapy as Yolanda Wells has declined and has had to use hoyer lift but could previous ambulate a little.   I listened and acknowledged Yolanda Wells's frustrations. I encouraged that we work with home health physical therapy, continue to treat, and if no improvement and  continued decline then we will re-evaluate our plans.   Of note, Yolanda Wells shares that she did utilize morphine as instructed by hospice and at that time Yolanda Wells was able to calm and rest peacefully but also called other family telling them that she was given morphine and indicated to them that if she did not wake up then it was because she was given morphine. Therefore, I believe Yolanda Wells is trying to do her best to support and care for her grandmother and support her [place].   Primary Decision Maker HCPOA Yolanda Wells    SUMMARY OF RECOMMENDATIONS   - Home with home health and therapy - Consider outpatient palliative care or at least re-consult at next admission - HCPOA requesting urine check for UTI  - Discussed plan with attending  Code Status/Advance Care Planning:  DNR - I did not discuss today   Symptom Management:   Per primary. No current symptoms. Denies pain.   Palliative Prophylaxis:   Aspiration, Bowel Regimen, Delirium Protocol, Frequent Pain Assessment, Oral Care and Turn Reposition  Additional Recommendations (Limitations, Scope, Preferences):  Full Scope Treatment  Psycho-social/Spiritual:   Desire for further Chaplaincy support:no  Additional Recommendations: Caregiving  Support/Resources, Education on Hospice and Grief/Bereavement Support  Prognosis:   < 6 months  Discharge Planning: Home with Home Health      Primary Diagnoses: Present on Admission: . SOB (shortness of breath)   I have reviewed the medical record, interviewed the patient and family, and examined the patient. The following aspects are pertinent.  Past Medical History:  Diagnosis Date  . Acute deep vein thrombosis (DVT) of distal vein of left lower extremity ([place]) 10/03/2015  . Acute pulmonary embolism ([place]) 10/03/2015  . Anticoagulant long-term use   . Arthritis   . Arthritis   . CHF (congestive heart failure) ([place])   . Chronic anticoagulation 10/03/2015  . Collagen vascular  disease ([place])   . Colostomy in place [facility]) 10/04/2015  . Deep venous thrombosis (HCC)    right lower extremity  . Dependent edema   . Diverticulosis of intestine with bleeding 04/02/2015  . Glenohumeral arthritis 06/28/2012  . Hammertoe 09/02/2012  . History of bilateral hip replacements 09/02/2012  . History of hysterectomy   . Hypertension   . Hypertension   . Hypokalemia   . Inability to ambulate due to hip 10/12/2015  . Mandibular dysfunction   . Onychomycosis 09/02/2012  . Osteoarthritis   . Polycythemia vera ([place])   . Presence of IVC filter 05/25/2015  . 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 Urinary incontinence in female 09/02/2016   Social History   Socioeconomic History  . Marital status: Widowed    Spouse name: Not on file  . Number of children: 5  . Years of education: Not on file  . Highest education level: Not on file  Occupational History  . Occupation: retired  Scientific laboratory technician  . Financial resource strain: Not very hard  . Food insecurity:    Worry: Patient refused    Inability: Patient refused  . Transportation needs:    Medical: Patient refused    Non-medical: Patient refused  Tobacco Use  . Smoking status: Never Smoker  . Smokeless tobacco: Never Used  Substance and Sexual Activity  . Alcohol use: No  . Drug use: No  . Sexual activity: Never  Lifestyle  . Physical activity:    Days per week: Patient refused    Minutes per session: Patient refused  . Stress: Only a little  Relationships  . Social connections:    Talks on phone: Patient refused    Gets together: Patient refused    Attends religious service: Patient refused    Active member of club or organization: Patient refused    Attends meetings of clubs or organizations: Patient refused    Relationship status: Patient refused  Other Topics Concern  . Not on file  Social History Narrative   [facility] at [facility]   Family History  Problem Relation Age of Onset  . Brain cancer Mother    . Heart attack Father   . COPD Father   . Coronary artery disease Father   . Hypertension Other   . Arthritis-Osteo Other   . Diabetes Brother   . Arthritis Brother   . Osteosarcoma Son   . Bone cancer Son   . Arthritis Sister   . Arthritis Brother   . Hypertension Brother   . Coronary artery disease Brother   . Malignant hyperthermia Brother    Scheduled Meds: . amLODipine  10 mg Oral Daily  . apixaban  5 mg Oral BID  . budesonide (PULMICORT) nebulizer solution  0.25 mg Nebulization  BID  . cholecalciferol  1,000 Units Oral Daily  . estradiol  1 Applicatorful Vaginal Q M,W,F  . folic acid  607 mcg Oral Daily  . guaiFENesin  600 mg Oral BID  . insulin aspart  0-5 Units Subcutaneous QHS  . insulin aspart  0-9 Units Subcutaneous TID WC  . ipratropium-albuterol  3 mL Nebulization Q4H  . mouth rinse  15 mL Mouth Rinse BID  . methylPREDNISolone (SOLU-MEDROL) injection  60 mg Intravenous Q12H  . pantoprazole  40 mg Oral Daily  . ruxolitinib phosphate  15 mg Oral BID  . sodium chloride flush  3 mL Intravenous Q12H   Continuous Infusions: . sodium chloride     PRN Meds:.sodium chloride, acetaminophen **OR** acetaminophen, ondansetron **OR** ondansetron (ZOFRAN) IV, sodium chloride flush No Known Allergies Review of Systems  Constitutional: Positive for activity change, appetite change and fatigue.  Respiratory: Negative for cough and shortness of breath.   Gastrointestinal: Negative for abdominal pain.  Neurological: Positive for weakness.    Physical Exam Vitals signs and nursing note reviewed.  Constitutional:      General: She is not in acute distress.    Appearance: She is overweight. She is ill-appearing.  Cardiovascular:     Rate and Rhythm: Normal rate.  Pulmonary:     Effort: Pulmonary effort is normal. No tachypnea, accessory muscle usage or respiratory distress.     Comments: Increased work of breathing at one point during conversation but was able to calm  patient to regular breathing pattern Neurological:     Mental Status: She is alert and oriented to person, place, and time.     Comments: Slight confusion at times but very small - appears to be at baseline with underlying dementia     Vital Signs: BP 121/73 (BP Location: Right Arm)   Pulse 72   Temp 97.8 F (36.6 C)   Resp 19   Ht '5\' 7"'$  (1.702 m)   Wt 91.6 kg   SpO2 100%   BMI 31.64 kg/m  Pain Scale: 0-10   Pain Score: 0-No pain   SpO2: SpO2: 100 % O2 Device:SpO2: 100 % O2 Flow Rate: .O2 Flow Rate (L/min): 2 L/min  IO: Intake/output summary:   Intake/Output Summary (Last 24 hours) at 11/09/2018 1250 Last data filed at 11/09/2018 1026 Gross per 24 hour  Intake 360 ml  Output -  Net 360 ml    LBM: Last BM Date: 11/07/18 Baseline Weight: Weight: 91.6 kg Most recent weight: Weight: 91.6 kg     Palliative Assessment/Data: 30%   Flowsheet Rows     Most Recent Value  Intake Tab  Referral Department  Hospitalist  Unit at Time of Referral  ER  Palliative Care Primary Diagnosis  Cardiac  Date Notified  11/08/18  Palliative Care Type  Return patient Palliative Care  Reason for referral  Clarify Goals of Care  Date of Admission  11/08/18  # of days IP prior to Palliative referral  0  Clinical Assessment  Psychosocial & Spiritual Assessment  Palliative Care Outcomes      Time In/Out: 1230-1300, 1430-1500 Time Total: 60 min Greater than 50%  of this time was spent counseling and coordinating care related to the above assessment and plan.  Signed by: Vinie Sill, NP Palliative Medicine Team Pager # [phone] (M-F 8a-5p) Team Phone # [phone] (Nights/Weekends)

## 2018-11-10 LAB — GLUCOSE, CAPILLARY
Glucose-Capillary: 118 mg/dL — ABNORMAL HIGH (ref 70–99)
Glucose-Capillary: 134 mg/dL — ABNORMAL HIGH (ref 70–99)

## 2018-11-10 MED ORDER — PREDNISONE 10 MG (21) PO TBPK: ORAL_TABLET | ORAL | 0 refills | Status: DC

## 2018-11-10 MED ORDER — IPRATROPIUM-ALBUTEROL 0.5-2.5 (3) MG/3ML IN SOLN: 3.0000 mL | RESPIRATORY_TRACT | 0 refills | Status: AC | PRN

## 2018-11-10 NOTE — Care Management (Signed)
Assessment completed via phone with granddaughter [name].  Granddaughter is primary caregiver. Patient is currently open with [facility].  Time Green with [facility] aware of admission.  At baseline patient is able to stand and pivot to [facility].  [name] states that patient has hospital bed, motorized WC, hoyer lift, BSC and oxygen. [name] states that most of the equipment was provided by Hospice.  PCP [place].  Utilizes [employer] for transportation to appointments.  Patient has private paid caregivers approximately 4 days a week, while [name] works 12 hour shifts.   [name] states that her goal is to change patient from [facility], back to [facility].  [name] states that now that the patient is on Hospice "she doesn't get the care she needs, and people don't want to treat her any more".  [name] also states that the patient has not received PT services with Hospice, and now the patient is more deconditioned, and she would like the patient to have PT.  [name] states that she will purse the change to home health services outpatient.  States she does not wish to do it at this time because she is afraid that Hospice will take the o2, lift and other equipment out of the home, and there may be a delay with the transition and it being a holiday.  RNCM offered to help facilitate, [name] politely declines.  Anticipate discharge today. Freddie Breech with [facility] update of plan for discharge, and Bessies wishes.

## 2018-11-10 NOTE — Discharge Instructions (Addendum)
Resume diet and activity as before.  2 Liters/min oxygen at night and as needed

## 2018-11-18 ENCOUNTER — Ambulatory Visit: Payer: Medicare Other | Attending: Specialist

## 2018-11-18 DIAGNOSIS — G4733 Obstructive sleep apnea (adult) (pediatric): Secondary | ICD-10-CM | POA: Diagnosis present

## 2018-11-21 ENCOUNTER — Encounter: Payer: Self-pay | Admitting: Emergency Medicine

## 2018-11-21 ENCOUNTER — Emergency Department
Admission: EM | Admit: 2018-11-21 | Discharge: 2018-11-22 | Disposition: A | Discharge status: Home / Self Care | Payer: Medicare Other | Attending: Emergency Medicine | Admitting: Emergency Medicine

## 2018-11-21 ENCOUNTER — Other Ambulatory Visit: Payer: Self-pay

## 2018-11-21 DIAGNOSIS — R0602 Shortness of breath: Secondary | ICD-10-CM | POA: Insufficient documentation

## 2018-11-21 DIAGNOSIS — I509 Heart failure, unspecified: Secondary | ICD-10-CM | POA: Diagnosis not present

## 2018-11-21 DIAGNOSIS — I110 Hypertensive heart disease with heart failure: Secondary | ICD-10-CM | POA: Insufficient documentation

## 2018-11-21 DIAGNOSIS — M79605 Pain in left leg: Secondary | ICD-10-CM | POA: Insufficient documentation

## 2018-11-21 DIAGNOSIS — Z79899 Other long term (current) drug therapy: Secondary | ICD-10-CM | POA: Insufficient documentation

## 2018-11-21 DIAGNOSIS — M79604 Pain in right leg: Secondary | ICD-10-CM

## 2018-11-21 MED ORDER — IPRATROPIUM-ALBUTEROL 0.5-2.5 (3) MG/3ML IN SOLN
3.0000 mL | Freq: Once | RESPIRATORY_TRACT | Status: AC
  Administered 2018-11-21: 3 mL via RESPIRATORY_TRACT
  Filled 2018-11-21: qty 3

## 2018-11-21 MED ORDER — SODIUM CHLORIDE 0.9 % IV BOLUS
1000.0000 mL | Freq: Once | INTRAVENOUS | Status: AC
  Administered 2018-11-22: 1000 mL via INTRAVENOUS

## 2018-11-21 MED ORDER — METHYLPREDNISOLONE SODIUM SUCC 125 MG IJ SOLR
80.0000 mg | Freq: Once | INTRAMUSCULAR | Status: AC
  Administered 2018-11-22: 80 mg via INTRAVENOUS
  Filled 2018-11-21: qty 2

## 2018-11-21 NOTE — ED Provider Notes (Signed)
[facility] Emergency Department Provider Note  ____________________________________________   First MD Initiated Contact with Patient 11/21/18 2310     (approximate)  I have reviewed the triage vital signs and the nursing notes.   HISTORY  Chief Complaint Leg Pain  Level 5 exemption history is limited by the patient's altered mental status  HPI Yolanda Wells is a 85 y.o. female who comes to the emergency department via EMS with left leg pain and shortness of breath.  The patient  reports a long history of chronic leg pain and apparently her granddaughter gave her morphine multiple times today which seemed to make the patient more confused than usual.  She does have previous deep vein thrombosis and is not sure whether or not she has been taking her anticoagulation.   Past Medical History:  Diagnosis Date  . Acute deep vein thrombosis (DVT) of distal vein of left lower extremity ([place]) 10/03/2015  . Acute pulmonary embolism ([place]) 10/03/2015  . Anticoagulant long-term use   . Arthritis   . Arthritis   . CHF (congestive heart failure) ([place])   . Chronic anticoagulation 10/03/2015  . Collagen vascular disease ([place])   . Colostomy in place [facility]) 10/04/2015  . Deep venous thrombosis (H[facility])    right lower extremity  . Dependent edema   . Diverticulosis of intestine with bleeding 04/02/2015  . Glenohumeral arthritis 06/28/2012  . Hammertoe 09/02/2012  . History of bilateral hip replacements 09/02/2012  . History of hysterectomy   . Hypertension   . Hypertension   . Hypokalemia   . Inability to ambulate due to hip 10/12/2015  . Mandibular dysfunction   . Onychomycosis 09/02/2012  . Osteoarthritis   . Polycythemia vera ([place])   . Presence of IVC filter 05/25/2015  . 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 Urinary incontinence in female 09/02/2016    Patient Active Problem List   Diagnosis Date Noted  . SOB (shortness of breath) 10/28/2018  .  Shortness of breath 09/27/2018  . COPD exacerbation ([place]) 09/22/2018  . Sepsis ([place]) 09/22/2018  . Lower GI bleed 09/09/2018  . AMS (altered mental status) 08/30/2018  . Pressure injury of skin 08/13/2018  . B12 deficiency 08/04/2018  . Anemia 06/07/2018  . Secondary myelofibrosis ([place]) 02/17/2018  . Goals of care, counseling/discussion 12/23/2017  . Palliative care by specialist   . DNR (do not resuscitate) discussion   . Weakness generalized   . Chronic diastolic CHF (congestive heart failure) ([place]) 07/28/2017  . Vascular dementia without behavioral disturbance ([place]) 07/08/2017  . Influenza with respiratory manifestation 12/01/2016  . Respiratory distress 11/23/2016  . Urinary incontinence in female 09/02/2016  . Vision loss of right eye 08/26/2016  . Blindness of right eye 08/26/2016  . Inability to ambulate due to hip 10/12/2015  . Colostomy in place [facility]) 10/04/2015  . Long term current use of anticoagulant therapy 10/03/2015  . Leukocytosis 09/25/2015  . CAP (community acquired pneumonia) 09/24/2015  . COPD (chronic obstructive pulmonary disease) ([place]) 09/24/2015  . UTI (urinary tract infection) 07/16/2015  . Abdominal wall cellulitis 07/15/2015  . DVT (deep venous thrombosis) ([place]) 07/15/2015  . HTN (hypertension) 07/15/2015  . Polycythemia vera ([place]) 07/15/2015  . Arthritis 07/15/2015  . Pulmonary emboli ([place]) 05/25/2015  . Diverticulosis of colon with hemorrhage 04/02/2015  . Diverticulosis of intestine with bleeding 04/02/2015  . Fall 01/10/2015  . Osteoarthritis 01/10/2015  . Hammertoe 09/02/2012  . S/P transmetatarsal amputation of foot ([place]) 09/02/2012  . Onychomycosis 09/02/2012  .  Other specified dermatoses 09/02/2012  . S/P hip replacement 08/23/2012  . Glenohumeral arthritis 06/28/2012    Past Surgical History:  Procedure Laterality Date  . ABDOMINAL HYSTERECTOMY    . BLEPHAROPLASTY Right 03/2017   right upper eyelid  . CARDIAC CATHETERIZATION Right  04/03/2015   Procedure: CENTRAL LINE INSERTION;  Surgeon: Sherri Rad, MD;  Location: [facility] ORS;  Service: General;  Laterality: Right;  . COLECTOMY WITH COLOSTOMY CREATION/HARTMANN PROCEDURE N/A 04/03/2015   Procedure: COLECTOMY WITH COLOSTOMY CREATION/HARTMANN PROCEDURE;  Surgeon: Sherri Rad, MD;  Location: [facility] ORS;  Service: General;  Laterality: N/A;  . COLON SURGERY    . FOOT AMPUTATION     partial  . FOOT AMPUTATION Right   . IVC FILTER INSERTION    . JOINT REPLACEMENT     Left and Right Hip  . PERIPHERAL VASCULAR CATHETERIZATION N/A 04/02/2015   Procedure: Visceral Angiography;  Surgeon: Algernon Huxley, MD;  Location: [place] CV LAB;  Service: Cardiovascular;  Laterality: N/A;  . PERIPHERAL VASCULAR CATHETERIZATION N/A 04/02/2015   Procedure: Visceral Artery Intervention;  Surgeon: Algernon Huxley, MD;  Location: [place] CV LAB;  Service: Cardiovascular;  Laterality: N/A;  . PERIPHERAL VASCULAR CATHETERIZATION N/A 05/25/2015   Procedure: IVC Filter Insertion;  Surgeon: Katha Cabal, MD;  Location: [place] CV LAB;  Service: Cardiovascular;  Laterality: N/A;  . TOE AMPUTATION     right  . TOTAL HIP ARTHROPLASTY      Prior to Admission medications   Medication Sig Start Date End Date Taking? Authorizing Provider  amLODipine (NORVASC) 5 MG tablet Take 2 tablets (10 mg total) by mouth daily. 10/31/18   Fritzi Mandes, MD  betamethasone valerate ointment (VALISONE) 0.1 % Apply 1 application topically 2 (two) times daily. 11/07/18 11/07/19  [provider]  cholecalciferol (VITAMIN D) 1000 units tablet Take 1,000 Units by mouth daily.    [provider]  ELIQUIS 5 MG TABS tablet TAKE ONE TABLET BY MOUTH TWICE A DAY Patient taking differently: Take 5 mg by mouth 2 (two) times daily.  11/04/18   Karen Kitchens, NP  estradiol (ESTRACE VAGINAL) 0.1 MG/GM vaginal cream Apply 0.5mg  (pea-sized amount)  just inside the vaginal introitus with a finger-tip on Monday,  Wednesday and Friday nights. 08/09/18   Zara Council A, PA-C  folic acid (FOLVITE) 735 MCG tablet Take 400 mcg by mouth daily.    [provider]  ipratropium-albuterol (DUONEB) 0.5-2.5 (3) MG/3ML SOLN Take 3 mLs by nebulization every 4 (four) hours as needed (for shortness of breath/wheezing). 11/10/18   Sudini, Srikar, MD  JAKAFI 15 MG tablet TAKE 1 TABLET (15 MG TOTAL) BY MOUTH 2 TIMES DAILY. Patient taking differently: Take 15 mg by mouth 2 (two) times daily.  10/28/18   Karen Kitchens, NP  ondansetron (ZOFRAN ODT) 4 MG disintegrating tablet Take 1 tablet (4 mg total) by mouth every 8 (eight) hours as needed. 11/26/18   Rudene Re, MD  pantoprazole (PROTONIX) 40 MG tablet Take 1 tablet (40 mg total) by mouth daily. 09/12/18   [name], MD  predniSONE (STERAPRED UNI-PAK 21 TAB) 10 MG (21) TBPK tablet 6 tabs day 1 and taper 10 mg a day - 6 days 11/10/18   Hillary Bow, MD    Allergies Patient has no known allergies.  Family History  Problem Relation Age of Onset  . Brain cancer Mother   . Heart attack Father   . COPD Father   . Coronary artery disease Father   .  Hypertension Other   . Arthritis-Osteo Other   . Diabetes Brother   . Arthritis Brother   . Osteosarcoma Son   . Bone cancer Son   . Arthritis Sister   . Arthritis Brother   . Hypertension Brother   . Coronary artery disease Brother   . Malignant hyperthermia Brother     Social History Social History   Tobacco Use  . Smoking status: Never Smoker  . Smokeless tobacco: Never Used  Substance Use Topics  . Alcohol use: No  . Drug use: No    Review of Systems Level 5 exemption history limited by the patient's altered mental status ____________________________________________   PHYSICAL EXAM:  VITAL SIGNS: ED Triage Vitals [11/21/18 2251]  Enc Vitals Group     BP 126/74     Pulse Rate 82     Resp 16     Temp 98.5 F (36.9 C)     Temp Source Oral     SpO2 100 %     Weight 202 lb  (91.6 kg)     Height 5\' 7"  (1.702 m)     Head Circumference      Peak Flow      Pain Score 9     Pain Loc      Pain Edu?      Excl. in [place]?     Constitutional: Mumbling nearly incoherent speech.  Patient appears either to have dementia or is intoxicated Eyes: PERRL EOMI. midrange and brisk Head: Atraumatic. Nose: No congestion/rhinnorhea. Mouth/Throat: No trismus Neck: No stridor.  No meningismus Cardiovascular: Normal rate, regular rhythm. Grossly normal heart sounds.  Good peripheral circulation. Respiratory: Slightly increased respiratory effort.  No retractions. Lungs CTAB and moving good air Gastrointestinal: Soft nontender Musculoskeletal: Light edema bilaterally legs equal in size Neurologic:   No gross focal neurologic deficits are appreciated. Skin:  Skin is warm, dry and intact. No rash noted. Psychiatric: Quite confused    ____________________________________________   DIFFERENTIAL includes but not limited to  Pulmonary embolism, osteomyelitis, deep vein thrombosis, intoxication ____________________________________________   LABS (all labs ordered are listed, but only abnormal results are displayed)  Labs Reviewed  COMPREHENSIVE METABOLIC PANEL - Abnormal; Notable for the following components:      Result Value   Glucose, Bld 110 (*)    Calcium 8.5 (*)    Total Protein 6.3 (*)    Albumin 2.8 (*)    All other components within normal limits  TROPONIN I - Abnormal; Notable for the following components:   Troponin I 0.03 (*)    All other components within normal limits  CBC WITH DIFFERENTIAL/PLATELET - Abnormal; Notable for the following components:   Hemoglobin 9.0 (*)    HCT 31.8 (*)    MCH 23.2 (*)    MCHC 28.3 (*)    RDW 30.9 (*)    nRBC 1.7 (*)    Abs Immature Granulocytes 0.70 (*)    All other components within normal limits  URINALYSIS, COMPLETE (UACMP) WITH MICROSCOPIC - Abnormal; Notable for the following components:   Color, Urine YELLOW (*)      APPearance CLEAR (*)    Specific Gravity, Urine 1.035 (*)    Protein, ur 30 (*)    Leukocytes, UA TRACE (*)    All other components within normal limits  BRAIN NATRIURETIC PEPTIDE    Lab work reviewed by me shows slightly elevated troponin which is not clinically significant.  Chronically low albumin __________________________________________  EKG  ED ECG REPORT  Tretha Sciara, the attending physician, personally viewed and interpreted this ECG.  Date: 11/26/2018 EKG Time:  Rate: 76 Rhythm: normal sinus rhythm QRS Axis: Right axis Intervals: normal ST/T Wave abnormalities: normal Narrative Interpretation: no evidence of acute ischemia  ____________________________________________  RADIOLOGY  CT angiogram reviewed by me with no evidence of clot but is consistent with pulmonary hypertension ____________________________________________   PROCEDURES  Procedure(s) performed: yes  Angiocath insertion Performed by: Darel Hong  Consent: Verbal consent obtained. Risks and benefits: risks, benefits and alternatives were discussed Time out: Immediately prior to procedure a "time out" was called to verify the correct patient, procedure, equipment, support staff and site/side marked as required.  Preparation: Patient was prepped and draped in the usual sterile fashion.  Vein Location: left upper extremity  Ultrasound Guided  Gauge: 20  Normal blood return and flush without difficulty Patient tolerance: Patient tolerated the procedure well with no immediate complications.  Angiocath insertion Performed by: Darel Hong  Consent: Verbal consent obtained. Risks and benefits: risks, benefits and alternatives were discussed Time out: Immediately prior to procedure a "time out" was called to verify the correct patient, procedure, equipment, support staff and site/side marked as required.  Preparation: Patient was prepped and draped in the usual sterile  fashion.  Vein Location: left AC  Ultrasound Guided  Gauge: 20  Normal blood return and flush without difficulty Patient tolerance: Patient tolerated the procedure well with no immediate complications.      Procedures  Critical Care performed: no  ____________________________________________   INITIAL IMPRESSION / ASSESSMENT AND PLAN / ED COURSE  Pertinent labs & imaging results that were available during my care of the patient were reviewed by me and considered in my medical decision making (see chart for details).   As part of my medical decision making, I reviewed the following data within the [place] History obtained from family if available, nursing notes, old chart and ekg, as well as notes from prior ED visits.  The patient arrives to the emergency department quite confused and is unable to say much aside from it being somewhat hard to breathe and having chronic leg swelling.  Her legs are equal in size and I am actually more concerned about the shortness of breath given her history of DVT with questionable medication compliance.  She had a CT angiogram of her chest which fortunately is negative for clot.  She does also have a history of COPD so was given several duo nebs along with Solu-Medrol with improvement in her symptoms.  Intramuscular ketorolac given with near complete resolution of her pain.  The patient was in the emergency department multiple hours and she had her mental status cleared up dramatically throughout the course of her stay which goes to support intoxication.  I explained to the patient that her leg pain is probably secondary to her previous chronic DVT.  She was surprised to learn that she had a blood clot.  I will discharge her with her current regimen and primary care follow-up.      ____________________________________________   FINAL CLINICAL IMPRESSION(S) / ED DIAGNOSES  Final diagnoses:  Right leg pain  Shortness of  breath      NEW MEDICATIONS STARTED DURING THIS VISIT:  Discharge Medication List as of 11/22/2018  3:14 AM       Note:  This document was prepared using Dragon voice recognition software and may include unintentional dictation errors.    Darel Hong, MD 11/26/18 2246

## 2018-11-21 NOTE — ED Triage Notes (Signed)
Pt arrives via [facility] with c/o left leg pain. Per EMS, pt has axillary temp of 99.3 and 82 HR. EMS reports that Granddaughter gave pt her 2.5 mg morphine around 2000 but did not give a reason why she did not give the total dose of 5 mg which the pt is prescribed for pain. Pt is bedridden but is alert and oriented at this time. EMS reports that Granddaughter informed them that she would be going to bed now because she has to work in the morning and she will not hear her cell phone, so call home phone number. Pt is in NAD and falling asleep at this time in triage.

## 2018-11-22 ENCOUNTER — Encounter: Payer: Self-pay | Admitting: Radiology

## 2018-11-22 ENCOUNTER — Emergency Department: Payer: Medicare Other

## 2018-11-22 LAB — CBC WITH DIFFERENTIAL/PLATELET
Abs Immature Granulocytes: 0.7 10*3/uL — ABNORMAL HIGH (ref 0.00–0.07)
Basophils Absolute: 0 10*3/uL (ref 0.0–0.1)
Basophils Relative: 0 %
EOS ABS: 0.1 10*3/uL (ref 0.0–0.5)
Eosinophils Relative: 2 %
HCT: 31.8 % — ABNORMAL LOW (ref 36.0–46.0)
Hemoglobin: 9 g/dL — ABNORMAL LOW (ref 12.0–15.0)
Immature Granulocytes: 10 %
Lymphocytes Relative: 12 %
Lymphs Abs: 0.9 10*3/uL (ref 0.7–4.0)
MCH: 23.2 pg — ABNORMAL LOW (ref 26.0–34.0)
MCHC: 28.3 g/dL — ABNORMAL LOW (ref 30.0–36.0)
MCV: 82 fL (ref 80.0–100.0)
MONOS PCT: 3 %
Monocytes Absolute: 0.2 10*3/uL (ref 0.1–1.0)
Neutro Abs: 5.3 10*3/uL (ref 1.7–7.7)
Neutrophils Relative %: 73 %
Platelets: 260 10*3/uL (ref 150–400)
RBC: 3.88 MIL/uL (ref 3.87–5.11)
RDW: 30.9 % — ABNORMAL HIGH (ref 11.5–15.5)
Smear Review: ADEQUATE
WBC: 7.2 10*3/uL (ref 4.0–10.5)
nRBC: 1.7 % — ABNORMAL HIGH (ref 0.0–0.2)

## 2018-11-22 LAB — COMPREHENSIVE METABOLIC PANEL
ALT: 9 U/L (ref 0–44)
AST: 16 U/L (ref 15–41)
Albumin: 2.8 g/dL — ABNORMAL LOW (ref 3.5–5.0)
Alkaline Phosphatase: 67 U/L (ref 38–126)
Anion gap: 5 (ref 5–15)
BILIRUBIN TOTAL: 0.7 mg/dL (ref 0.3–1.2)
BUN: 11 mg/dL (ref 8–23)
CO2: 30 mmol/L (ref 22–32)
Calcium: 8.5 mg/dL — ABNORMAL LOW (ref 8.9–10.3)
Chloride: 106 mmol/L (ref 98–111)
Creatinine, Ser: 0.56 mg/dL (ref 0.44–1.00)
Glucose, Bld: 110 mg/dL — ABNORMAL HIGH (ref 70–99)
Potassium: 4.1 mmol/L (ref 3.5–5.1)
Sodium: 141 mmol/L (ref 135–145)
TOTAL PROTEIN: 6.3 g/dL — AB (ref 6.5–8.1)

## 2018-11-22 LAB — URINALYSIS, COMPLETE (UACMP) WITH MICROSCOPIC
BILIRUBIN URINE: NEGATIVE
Bacteria, UA: NONE SEEN
Glucose, UA: NEGATIVE mg/dL
Hgb urine dipstick: NEGATIVE
Ketones, ur: NEGATIVE mg/dL
Nitrite: NEGATIVE
Protein, ur: 30 mg/dL — AB
SPECIFIC GRAVITY, URINE: 1.035 — AB (ref 1.005–1.030)
pH: 5 (ref 5.0–8.0)

## 2018-11-22 LAB — TROPONIN I: TROPONIN I: 0.03 ng/mL — AB (ref <0.03)

## 2018-11-22 LAB — BRAIN NATRIURETIC PEPTIDE: B Natriuretic Peptide: 57 pg/mL (ref 0.0–100.0)

## 2018-11-22 MED ORDER — IOHEXOL 350 MG/ML SOLN
75.0000 mL | Freq: Once | INTRAVENOUS | Status: AC | PRN
  Administered 2018-11-22: 100 mL via INTRAVENOUS

## 2018-11-22 MED ORDER — KETOROLAC TROMETHAMINE 60 MG/2ML IM SOLN
30.0000 mg | Freq: Once | INTRAMUSCULAR | Status: AC
  Administered 2018-11-22: 30 mg via INTRAMUSCULAR
  Filled 2018-11-22: qty 2

## 2018-11-22 NOTE — ED Notes (Signed)
IV attempt x 2 by this RN. 

## 2018-11-22 NOTE — Discharge Instructions (Signed)
It was a pleasure to take care of you today, and thank you for coming to our emergency department.  If you have any questions or concerns before leaving please ask the nurse to grab me and I'm more than happy to go through your aftercare instructions again.  If you were prescribed any opioid pain medication today such as Norco, Vicodin, Percocet, morphine, hydrocodone, or oxycodone please make sure you do not drive when you are taking this medication as it can alter your ability to drive safely.  If you have any concerns once you are home that you are not improving or are in fact getting worse before you can make it to your follow-up appointment, please do not hesitate to call 911 and come back for further evaluation.  Darel Hong, MD  Results for orders placed or performed during the hospital encounter of 11/21/18  Comprehensive metabolic panel  Result Value Ref Range   Sodium 141 135 - 145 mmol/L   Potassium 4.1 3.5 - 5.1 mmol/L   Chloride 106 98 - 111 mmol/L   CO2 30 22 - 32 mmol/L   Glucose, Bld 110 (H) 70 - 99 mg/dL   BUN 11 8 - 23 mg/dL   Creatinine, Ser 0.56 0.44 - 1.00 mg/dL   Calcium 8.5 (L) 8.9 - 10.3 mg/dL   Total Protein 6.3 (L) 6.5 - 8.1 g/dL   Albumin 2.8 (L) 3.5 - 5.0 g/dL   AST 16 15 - 41 U/L   ALT 9 0 - 44 U/L   Alkaline Phosphatase 67 38 - 126 U/L   Total Bilirubin 0.7 0.3 - 1.2 mg/dL   GFR calc non Af Amer >60 >60 mL/min   GFR calc Af Amer >60 >60 mL/min   Anion gap 5 5 - 15  Troponin I - Once  Result Value Ref Range   Troponin I 0.03 (HH) <0.03 ng/mL  Brain natriuretic peptide  Result Value Ref Range   B Natriuretic Peptide 57.0 0.0 - 100.0 pg/mL  CBC with Differential  Result Value Ref Range   WBC 7.2 4.0 - 10.5 K/uL   RBC 3.88 3.87 - 5.11 MIL/uL   Hemoglobin 9.0 (L) 12.0 - 15.0 g/dL   HCT 31.8 (L) 36.0 - 46.0 %   MCV 82.0 80.0 - 100.0 fL   MCH 23.2 (L) 26.0 - 34.0 pg   MCHC 28.3 (L) 30.0 - 36.0 g/dL   RDW 30.9 (H) 11.5 - 15.5 %   Platelets 260 150  - 400 K/uL   nRBC 1.7 (H) 0.0 - 0.2 %   Neutrophils Relative % 73 %   Neutro Abs 5.3 1.7 - 7.7 K/uL   Lymphocytes Relative 12 %   Lymphs Abs 0.9 0.7 - 4.0 K/uL   Monocytes Relative 3 %   Monocytes Absolute 0.2 0.1 - 1.0 K/uL   Eosinophils Relative 2 %   Eosinophils Absolute 0.1 0.0 - 0.5 K/uL   Basophils Relative 0 %   Basophils Absolute 0.0 0.0 - 0.1 K/uL   Smear Review PLATELETS APPEAR ADEQUATE    Immature Granulocytes 10 %   Abs Immature Granulocytes 0.70 (H) 0.00 - 0.07 K/uL   Schistocytes PRESENT   Urinalysis, Complete w Microscopic  Result Value Ref Range   Color, Urine YELLOW (A) YELLOW   APPearance CLEAR (A) CLEAR   Specific Gravity, Urine 1.035 (H) 1.005 - 1.030   pH 5.0 5.0 - 8.0   Glucose, UA NEGATIVE NEGATIVE mg/dL   Hgb urine dipstick NEGATIVE NEGATIVE  Bilirubin Urine NEGATIVE NEGATIVE   Ketones, ur NEGATIVE NEGATIVE mg/dL   Protein, ur 30 (A) NEGATIVE mg/dL   Nitrite NEGATIVE NEGATIVE   Leukocytes, UA TRACE (A) NEGATIVE   RBC / HPF 6-10 0 - 5 RBC/hpf   WBC, UA 21-50 0 - 5 WBC/hpf   Bacteria, UA NONE SEEN NONE SEEN   Squamous Epithelial / LPF 0-5 0 - 5   Mucus PRESENT    Ct Angio Chest Pe W/cm &/or Wo Cm  Result Date: 11/22/2018 CLINICAL DATA:  Left leg pain. History of DVT, noncompliant with anticoagulation. EXAM: CT ANGIOGRAPHY CHEST WITH CONTRAST TECHNIQUE: Multidetector CT imaging of the chest was performed using the standard protocol during bolus administration of intravenous contrast. Multiplanar CT image reconstructions and MIPs were obtained to evaluate the vascular anatomy. CONTRAST:  120mL OMNIPAQUE IOHEXOL 350 MG/ML SOLN. Technologist note indicates first attempt to pulmonary embolus study infiltrate in the right arm. COMPARISON:  09/21/2018 FINDINGS: Cardiovascular: Motion artifact limits examination. There is good opacification of the central and segmental pulmonary arteries. No focal filling defects. No evidence of significant pulmonary embolus. The  main pulmonary artery is dilated suggesting possible pulmonary arterial hypertension. Normal caliber thoracic aorta with calcification. No aortic dissection. Great vessel origins are patent. Mild cardiac enlargement. Calcification in the mitral valve annulus. No pericardial effusions. Mediastinum/Nodes: Esophagus is decompressed. No significant lymphadenopathy in the chest. Multiple calcified thyroid nodules including a large calcified right nodule measuring 2.6 cm diameter. No change since prior study. Lungs/Pleura: Motion artifact limits examination. There is atelectasis in the lung bases, greater on the right. No pleural effusions. No pneumothorax. Airways are patent. Upper Abdomen: Cholelithiasis with multiple stones seen in the gallbladder. Largest cyst on the right kidney. Inferior vena caval filter. Musculoskeletal: Degenerative changes in the spine. Review of the MIP images confirms the above findings. IMPRESSION: 1. No evidence of significant pulmonary embolus. 2. Enlarged main pulmonary artery suggesting pulmonary arterial hypertension. 3. Atelectasis in the lung bases, greater on the right. 4. Cholelithiasis. Aortic Atherosclerosis (ICD10-I70.0). Electronically Signed   By: Lucienne Capers M.D.   On: 11/22/2018 02:24   Dg Chest Portable 1 View  Result Date: 11/08/2018 CLINICAL DATA:  85 year old female with shortness of breath and cough today. Body aches. EXAM: PORTABLE CHEST 1 VIEW COMPARISON:  Portable chest 10/28/2018 and earlier. FINDINGS: Portable AP semi upright view at 1057 hours. Chronic cardiomegaly, tortuous thoracic aorta and central pulmonary artery enlargement. Chronic elevation of the right hemidiaphragm with right lung base atelectasis. No pneumothorax, pulmonary edema, pleural effusion or acute pulmonary opacity identified. Visualized tracheal air column is within normal limits. Advanced degenerative changes at both shoulders. Dextroconvex thoracic scoliosis. No acute osseous  abnormality identified. IMPRESSION: 1.  No acute cardiopulmonary abnormality. 2. Chronic cardiomegaly, central pulmonary artery enlargement, tortuous aorta, and elevated right hemidiaphragm. Electronically Signed   By: Genevie Ann M.D.   On: 11/08/2018 11:08   Dg Chest Portable 1 View  Result Date: 10/28/2018 CLINICAL DATA:  Altered mental status and shortness of breath EXAM: PORTABLE CHEST 1 VIEW COMPARISON:  September 27, 2018 FINDINGS: There is stable mild elevation of the right hemidiaphragm. There is no edema or consolidation. Heart is mildly enlarged with pulmonary vascularity normal. There is calcification in the mitral annulus. There is aortic atherosclerosis. No adenopathy. There is degenerative change in each shoulder. IMPRESSION: Stable cardiac prominence. No edema or consolidation. Mild elevation of the right hemidiaphragm is stable. There is aortic atherosclerosis. Aortic Atherosclerosis (ICD10-I70.0). Electronically Signed   By: Gwyndolyn Saxon  Jasmine December III M.D.   On: 10/28/2018 09:53

## 2018-11-22 NOTE — ED Notes (Addendum)
Granddaughter called to check on pt. No information was given at the time. Pt did leave her number for RN, Laurey Arrow @ [phone]. RN made aware of call.

## 2018-11-22 NOTE — ED Notes (Signed)
Pt's granddaughter called with update about pt and pt's DC. Pt resting quietly at this time in room and in NAD.

## 2018-11-23 NOTE — Discharge Summary (Signed)
[facility] NAME: Yolanda Wells    MR#:  [id number]  DATE OF BIRTH:  [birth year]  DATE OF ADMISSION:  11/08/2018 ADMITTING PHYSICIAN: Dustin Flock, MD  DATE OF DISCHARGE: 11/10/2018  5:27 PM  PRIMARY CARE PHYSICIAN: [name], MD   ADMISSION DIAGNOSIS:  COPD exacerbation ([place]) [J44.1] Acute on chronic respiratory failure with hypoxia ([place]) [J96.21]  DISCHARGE DIAGNOSIS:  Active Problems:   SOB (shortness of breath)   SECONDARY DIAGNOSIS:   Past Medical History:  Diagnosis Date  . Acute deep vein thrombosis (DVT) of distal vein of left lower extremity ([place]) 10/03/2015  . Acute pulmonary embolism ([place]) 10/03/2015  . Anticoagulant long-term use   . Arthritis   . Arthritis   . CHF (congestive heart failure) ([place])   . Chronic anticoagulation 10/03/2015  . Collagen vascular disease ([place])   . Colostomy in place [facility]) 10/04/2015  . Deep venous thrombosis (H[facility])    right lower extremity  . Dependent edema   . Diverticulosis of intestine with bleeding 04/02/2015  . Glenohumeral arthritis 06/28/2012  . Hammertoe 09/02/2012  . History of bilateral hip replacements 09/02/2012  . History of hysterectomy   . Hypertension   . Hypertension   . Hypokalemia   . Inability to ambulate due to hip 10/12/2015  . Mandibular dysfunction   . Onychomycosis 09/02/2012  . Osteoarthritis   . Polycythemia vera ([place])   . Presence of IVC filter 05/25/2015  . Stroke ([place]) 03/26/2015   cerebellar  . Urinary incontinence in female 09/02/2016   ADMITTING HISTORY  HISTORY OF PRESENT ILLNESS: Yolanda Wells  is a 85 y.o. female with a known history of asthma, DVT, history of pulmonary embolism, congestive heart failure, hypertension, and diabetes who has recurrent admissions to the hospital with shortness of breath.  Patient was recently hospitalized discharge 6 days ago.  Who is presenting to the emergency room with complaint of shortness of  breath and cough.  Patient in the ER was noted to have some hypoxia with sats dropping to 86.  Were asked to admit the patient.  Patient states that her breathing got worse today and she got more short of breath.  She is on chronic 2 L oxygen but had to bump it up to 3 L.  She also states that she may have gotten anxious because the girl that was supposed to show up with did not show up today.  HOSPITAL COURSE:   *Acute bronchitis *Diabetes mellitus *Hypertension *GERD  Patient was admitted with IV steroids, nebulizers.  Improved well.  By day of discharge she has shown significant improvement.  Seen by palliative care.  Will be discharged back home.  Patient is DNR.  Prednisone taper at discharge. Patient has hospice services at home.  Poor prognosis.  CONSULTS OBTAINED:    DRUG ALLERGIES:  No Known Allergies  DISCHARGE MEDICATIONS:   Allergies as of 11/10/2018   No Known Allergies     Medication List    STOP taking these medications   cefdinir 300 MG capsule Commonly known as:  OMNICEF     TAKE these medications   amLODipine 5 MG tablet Commonly known as:  NORVASC Take 2 tablets (10 mg total) by mouth daily.   betamethasone valerate ointment 0.1 % Commonly known as:  VALISONE Apply 1 application topically 2 (two) times daily.   cholecalciferol 1000 units tablet Commonly known as:  VITAMIN D Take 1,000 Units by mouth daily.   ELIQUIS 5  MG Tabs tablet Generic drug:  apixaban TAKE ONE TABLET BY MOUTH TWICE A DAY What changed:  how much to take   estradiol 0.1 MG/GM vaginal cream Commonly known as:  ESTRACE VAGINAL Apply 0.5mg  (pea-sized amount)  just inside the vaginal introitus with a finger-tip on Monday, Wednesday and Friday nights.   folic acid 716 MCG tablet Commonly known as:  FOLVITE Take 400 mcg by mouth daily.   ipratropium-albuterol 0.5-2.5 (3) MG/3ML Soln Commonly known as:  DUONEB Take 3 mLs by nebulization every 4 (four) hours as needed (for  shortness of breath/wheezing).   JAKAFI 15 MG tablet Generic drug:  ruxolitinib phosphate TAKE 1 TABLET (15 MG TOTAL) BY MOUTH 2 TIMES DAILY. What changed:  See the new instructions.   pantoprazole 40 MG tablet Commonly known as:  PROTONIX Take 1 tablet (40 mg total) by mouth daily.   predniSONE 10 MG (21) Tbpk tablet Commonly known as:  STERAPRED UNI-PAK 21 TAB 6 tabs day 1 and taper 10 mg a day - 6 days       Today   VITAL SIGNS:  Blood pressure 136/72, pulse 66, temperature (!) 97.5 F (36.4 C), temperature source Oral, resp. rate 16, height 5\' 7"  (1.702 m), weight 91.6 kg, SpO2 95 %.  I/O:  No intake or output data in the 24 hours ending 11/23/18 1607  PHYSICAL EXAMINATION:  Physical Exam  GENERAL:  85 y.o.-year-old patient lying in the bed with no acute distress.  LUNGS: Normal breath sounds bilaterally, no wheezing, rales,rhonchi or crepitation. No use of accessory muscles of respiration.  CARDIOVASCULAR: S1, S2 normal. No murmurs, rubs, or gallops.  ABDOMEN: Soft, non-tender, non-distended. Bowel sounds present. No organomegaly or mass.  NEUROLOGIC: Moves all 4 extremities. PSYCHIATRIC: The patient is alert and oriented x 3.  SKIN: No obvious rash, lesion, or ulcer.   DATA REVIEW:   CBC Recent Labs  Lab 11/21/18 2358  WBC 7.2  HGB 9.0*  HCT 31.8*  PLT 260    Chemistries  Recent Labs  Lab 11/21/18 2358  NA 141  K 4.1  CL 106  CO2 30  GLUCOSE 110*  BUN 11  CREATININE 0.56  CALCIUM 8.5*  AST 16  ALT 9  ALKPHOS 67  BILITOT 0.7    Cardiac Enzymes Recent Labs  Lab 11/21/18 2358  TROPONINI 0.03*    Microbiology Results  Results for orders placed or performed during the hospital encounter of 10/28/18  Urine Culture     Status: Abnormal   Collection Time: 10/28/18  8:29 AM  Result Value Ref Range Status   Specimen Description   Final    URINE, CATHETERIZED Performed at [facility], [address] [place], [place] [address]     Special Requests   Final    NONE Performed at [facility], [address] [place], [place] [address]    Culture >=100,000 COLONIES/mL PROVIDENCIA STUARTII (A)  Final   Report Status 10/30/2018 FINAL  Final   Organism ID, Bacteria PROVIDENCIA STUARTII (A)  Final      Susceptibility   Providencia stuartii - MIC*    AMPICILLIN >=32 RESISTANT Resistant     CEFAZOLIN >=64 RESISTANT Resistant     CEFTRIAXONE <=1 SENSITIVE Sensitive     CIPROFLOXACIN >=4 RESISTANT Resistant     GENTAMICIN RESISTANT Resistant     IMIPENEM 0.5 SENSITIVE Sensitive     NITROFURANTOIN 256 RESISTANT Resistant     TRIMETH/SULFA <=20 SENSITIVE Sensitive     AMPICILLIN/SULBACTAM  16 INTERMEDIATE Intermediate     PIP/TAZO <=4 SENSITIVE Sensitive     * >=100,000 COLONIES/mL PROVIDENCIA STUARTII    RADIOLOGY:  Ct Angio Chest Pe W/cm &/or Wo Cm  Result Date: 11/22/2018 CLINICAL DATA:  Left leg pain. History of DVT, noncompliant with anticoagulation. EXAM: CT ANGIOGRAPHY CHEST WITH CONTRAST TECHNIQUE: Multidetector CT imaging of the chest was performed using the standard protocol during bolus administration of intravenous contrast. Multiplanar CT image reconstructions and MIPs were obtained to evaluate the vascular anatomy. CONTRAST:  178mL OMNIPAQUE IOHEXOL 350 MG/ML SOLN. Technologist note indicates first attempt to pulmonary embolus study infiltrate in the right arm. COMPARISON:  09/21/2018 FINDINGS: Cardiovascular: Motion artifact limits examination. There is good opacification of the central and segmental pulmonary arteries. No focal filling defects. No evidence of significant pulmonary embolus. The main pulmonary artery is dilated suggesting possible pulmonary arterial hypertension. Normal caliber thoracic aorta with calcification. No aortic dissection. Great vessel origins are patent. Mild cardiac enlargement. Calcification in the mitral valve annulus. No pericardial effusions. Mediastinum/Nodes: Esophagus  is decompressed. No significant lymphadenopathy in the chest. Multiple calcified thyroid nodules including a large calcified right nodule measuring 2.6 cm diameter. No change since prior study. Lungs/Pleura: Motion artifact limits examination. There is atelectasis in the lung bases, greater on the right. No pleural effusions. No pneumothorax. Airways are patent. Upper Abdomen: Cholelithiasis with multiple stones seen in the gallbladder. Largest cyst on the right kidney. Inferior vena caval filter. Musculoskeletal: Degenerative changes in the spine. Review of the MIP images confirms the above findings. IMPRESSION: 1. No evidence of significant pulmonary embolus. 2. Enlarged main pulmonary artery suggesting pulmonary arterial hypertension. 3. Atelectasis in the lung bases, greater on the right. 4. Cholelithiasis. Aortic Atherosclerosis (ICD10-I70.0). Electronically Signed   By: Lucienne Capers M.D.   On: 11/22/2018 02:24    Follow up with PCP in 1 week.  Management plans discussed with the patient, family and they are in agreement.  CODE STATUS:  Code Status History    Date Active Date Inactive Code Status Order ID Comments User Context   11/08/2018 1532 11/10/2018 2032 DNR [id number]  Dustin Flock, MD Inpatient   10/28/2018 1334 10/31/2018 1621 DNR [id number]  Dustin Flock, MD Inpatient   09/27/2018 1901 09/30/2018 1545 DNR [id number]  Loletha Grayer, MD ED   09/22/2018 0133 09/25/2018 2201 Full Code [id number]  Amelia Jo, MD Inpatient   09/09/2018 1240 09/12/2018 2008 Full Code [id number]  Loletha Grayer, MD ED   08/30/2018 2018 09/01/2018 2004 DNR [id number]  Nicholes Mango, MD ED   08/30/2018 1903 08/30/2018 2018 Full Code [id number]  Nicholes Mango, MD ED   08/13/2018 1038 08/15/2018 1836 Full Code [id number]  Loletha Grayer, MD Inpatient   08/12/2018 2001 08/13/2018 1038 DNR [id number]  Gorden Harms, MD ED   08/12/2018 2001 08/12/2018 2001 Full Code [id number]  Gorden Harms, MD ED    06/15/2018 1635 06/18/2018 2009 Full Code [id number]  [name], MD Inpatient   02/24/2018 0347 02/27/2018 0027 Full Code [id number]  Amelia Jo, MD Inpatient   10/15/2017 1141 10/16/2017 2204 Full Code [id number]  Knox Royalty, NP Inpatient   10/13/2017 2246 10/15/2017 1141 DNR [id number]  Lance Coon, MD ED   10/06/2017 1454 10/09/2017 2058 DNR [id number]  Nicholes Mango, MD Inpatient   10/06/2017 1111 10/06/2017 1453 Full Code [id number]  Nicholes Mango, MD Inpatient   05/27/2017 1954 05/28/2017 2013 Full Code [id number]  Vaughan Basta, MD Inpatient   05/23/2017 2322 05/26/2017 [place] Full Code [id number]  Lance Coon, MD Inpatient   04/10/2017 0325 04/11/2017 1916 Full Code [id number]  Harrie Foreman, MD Inpatient   11/23/2016 0629 12/01/2016 1935 Full Code [id number]  Harrie Foreman, MD Inpatient   08/26/2016 0445 08/26/2016 1923 Full Code [id number]  Saundra Shelling, MD Inpatient   09/22/2015 1755 09/25/2015 1844 Full Code [id number]  Demetrios Loll, MD ED   09/15/2015 0341 09/17/2015 1741 Full Code [id number]  Harrie Foreman, MD Inpatient   07/16/2015 0151 07/20/2015 2334 Full Code [id number]  Lance Coon, MD Inpatient   05/25/2015 0834 05/29/2015 1616 Full Code [id number]  Harrie Foreman, MD Inpatient   04/02/2015 0606 04/08/2015 1651 Full Code [id number]  Harrie Foreman, MD Inpatient    Questions for Most Recent Historical Code Status (Order [id number])    Question Answer Comment   In the event of cardiac or respiratory ARREST Do not call a "code blue"    In the event of cardiac or respiratory ARREST Do not perform Intubation, CPR, defibrillation or ACLS    In the event of cardiac or respiratory ARREST Use medication by any route, position, wound care, and other measures to relive pain and suffering. May use oxygen, suction and manual treatment of airway obstruction as needed for comfort.    Comments nurse may pronounce       TOTAL TIME TAKING CARE OF THIS PATIENT ON DAY OF DISCHARGE: more  than 30 minutes.   Neita Carp M.D on 11/23/2018 at 4:07 PM  Between 7am to 6pm - Pager - [phone]  After 6pm go to [email or web address] - password [email or web address] [facility]  Office  [phone]  CC: Primary care physician; [name], MD  Note: This dictation was prepared with Dragon dictation along with smaller phrase technology. Any transcriptional errors that result from this process are unintentional.

## 2018-11-24 ENCOUNTER — Encounter: Payer: Self-pay | Admitting: Urgent Care

## 2018-11-26 ENCOUNTER — Emergency Department: Payer: Medicare Other

## 2018-11-26 ENCOUNTER — Encounter: Payer: Self-pay | Admitting: Emergency Medicine

## 2018-11-26 ENCOUNTER — Emergency Department
Admission: EM | Admit: 2018-11-26 | Discharge: 2018-11-26 | Disposition: A | Discharge status: Home / Self Care | Payer: Medicare Other | Attending: Emergency Medicine | Admitting: Emergency Medicine

## 2018-11-26 DIAGNOSIS — R197 Diarrhea, unspecified: Secondary | ICD-10-CM | POA: Insufficient documentation

## 2018-11-26 DIAGNOSIS — I5032 Chronic diastolic (congestive) heart failure: Secondary | ICD-10-CM | POA: Diagnosis not present

## 2018-11-26 DIAGNOSIS — R600 Localized edema: Secondary | ICD-10-CM | POA: Insufficient documentation

## 2018-11-26 DIAGNOSIS — J449 Chronic obstructive pulmonary disease, unspecified: Secondary | ICD-10-CM | POA: Diagnosis not present

## 2018-11-26 DIAGNOSIS — I110 Hypertensive heart disease with heart failure: Secondary | ICD-10-CM | POA: Insufficient documentation

## 2018-11-26 DIAGNOSIS — M7989 Other specified soft tissue disorders: Secondary | ICD-10-CM | POA: Diagnosis not present

## 2018-11-26 DIAGNOSIS — R1084 Generalized abdominal pain: Secondary | ICD-10-CM | POA: Insufficient documentation

## 2018-11-26 DIAGNOSIS — Z8673 Personal history of transient ischemic attack (TIA), and cerebral infarction without residual deficits: Secondary | ICD-10-CM | POA: Insufficient documentation

## 2018-11-26 DIAGNOSIS — Z79899 Other long term (current) drug therapy: Secondary | ICD-10-CM | POA: Diagnosis not present

## 2018-11-26 DIAGNOSIS — R112 Nausea with vomiting, unspecified: Secondary | ICD-10-CM | POA: Insufficient documentation

## 2018-11-26 LAB — CBC WITH DIFFERENTIAL/PLATELET
ABS IMMATURE GRANULOCYTES: 1.17 10*3/uL — AB (ref 0.00–0.07)
Basophils Absolute: 0 10*3/uL (ref 0.0–0.1)
Basophils Relative: 0 %
Eosinophils Absolute: 0.1 10*3/uL (ref 0.0–0.5)
Eosinophils Relative: 1 %
HCT: 29.7 % — ABNORMAL LOW (ref 36.0–46.0)
HEMOGLOBIN: 8.1 g/dL — AB (ref 12.0–15.0)
Immature Granulocytes: 14 %
LYMPHS ABS: 1.1 10*3/uL (ref 0.7–4.0)
Lymphocytes Relative: 13 %
MCH: 22.8 pg — ABNORMAL LOW (ref 26.0–34.0)
MCHC: 27.3 g/dL — ABNORMAL LOW (ref 30.0–36.0)
MCV: 83.7 fL (ref 80.0–100.0)
Monocytes Absolute: 0.2 10*3/uL (ref 0.1–1.0)
Monocytes Relative: 3 %
Neutro Abs: 5.8 10*3/uL (ref 1.7–7.7)
Neutrophils Relative %: 69 %
Platelets: 231 10*3/uL (ref 150–400)
RBC: 3.55 MIL/uL — ABNORMAL LOW (ref 3.87–5.11)
RDW: 31 % — ABNORMAL HIGH (ref 11.5–15.5)
Smear Review: NORMAL
WBC: 8.4 10*3/uL (ref 4.0–10.5)
nRBC: 3.2 % — ABNORMAL HIGH (ref 0.0–0.2)

## 2018-11-26 LAB — COMPREHENSIVE METABOLIC PANEL
ALK PHOS: 56 U/L (ref 38–126)
ALT: 7 U/L (ref 0–44)
AST: 13 U/L — ABNORMAL LOW (ref 15–41)
Albumin: 2.4 g/dL — ABNORMAL LOW (ref 3.5–5.0)
Anion gap: 3 — ABNORMAL LOW (ref 5–15)
BUN: 11 mg/dL (ref 8–23)
CO2: 29 mmol/L (ref 22–32)
Calcium: 7.8 mg/dL — ABNORMAL LOW (ref 8.9–10.3)
Chloride: 110 mmol/L (ref 98–111)
Creatinine, Ser: 0.38 mg/dL — ABNORMAL LOW (ref 0.44–1.00)
GFR calc Af Amer: >60 mL/min (ref >60)
GFR calc non Af Amer: >60 mL/min (ref >60)
Glucose, Bld: 92 mg/dL (ref 70–99)
Potassium: 3.6 mmol/L (ref 3.5–5.1)
Sodium: 142 mmol/L (ref 135–145)
Total Bilirubin: 0.7 mg/dL (ref 0.3–1.2)
Total Protein: 5.2 g/dL — ABNORMAL LOW (ref 6.5–8.1)

## 2018-11-26 LAB — LIPASE, BLOOD: Lipase: 16 U/L (ref 11–51)

## 2018-11-26 LAB — URINALYSIS, COMPLETE (UACMP) WITH MICROSCOPIC
Bilirubin Urine: NEGATIVE
Glucose, UA: NEGATIVE mg/dL
Hgb urine dipstick: NEGATIVE
KETONES UR: NEGATIVE mg/dL
Leukocytes, UA: NEGATIVE
Nitrite: NEGATIVE
PROTEIN: NEGATIVE mg/dL
Specific Gravity, Urine: 1.015 (ref 1.005–1.030)
pH: 5 (ref 5.0–8.0)

## 2018-11-26 MED ORDER — ADENOSINE 12 MG/4ML IV SOLN
INTRAVENOUS | Status: AC
  Filled 2018-11-26: qty 4

## 2018-11-26 MED ORDER — ONDANSETRON 4 MG PO TBDP: 4.0000 mg | ORAL_TABLET | Freq: Three times a day (TID) | ORAL | 0 refills | Status: AC | PRN

## 2018-11-26 MED ORDER — ADENOSINE 6 MG/2ML IV SOLN
INTRAVENOUS | Status: AC
  Filled 2018-11-26: qty 2

## 2018-11-26 MED ORDER — SODIUM CHLORIDE 0.9 % IV BOLUS
1000.0000 mL | Freq: Once | INTRAVENOUS | Status: AC
  Administered 2018-11-26: 1000 mL via INTRAVENOUS

## 2018-11-26 MED ORDER — ONDANSETRON HCL 4 MG/2ML IJ SOLN
4.0000 mg | Freq: Once | INTRAMUSCULAR | Status: AC
  Administered 2018-11-26: 4 mg via INTRAVENOUS
  Filled 2018-11-26: qty 2

## 2018-11-26 NOTE — ED Provider Notes (Signed)
Ultrasound of the arm is negative for DVT we will discharge the patient as planned.   Nena Polio, MD 11/26/18 2138

## 2018-11-26 NOTE — ED Notes (Signed)
Pt is difficult stick. Charge RN able to obtain IV access after attempts by this RN. Blood return but limited. Specimens sent to lab for possible analysis.

## 2018-11-26 NOTE — ED Notes (Signed)
Patient transported to Ultrasound 

## 2018-11-26 NOTE — ED Notes (Signed)
Pt taken by EMS prior to RN reassessment of vitals and before signature obtained.

## 2018-11-26 NOTE — ED Triage Notes (Signed)
Pt to ED with c/o of Abdominal pain that she states started today. Pt with c/o N/V/D. Per daughter Pt had 1 occurrence of diarrhea today and one occurrence yesterday. Also one occurrence of emesis today.

## 2018-11-26 NOTE — Discharge Instructions (Signed)

## 2018-11-26 NOTE — ED Notes (Signed)
Called [facility] for transport to [address] [place]

## 2018-11-26 NOTE — ED Notes (Signed)
Pt verbalized understanding of discharge instructions. NAD at this time. 

## 2018-11-26 NOTE — ED Provider Notes (Addendum)
[facility] Emergency Department Provider Note  ____________________________________________  Time seen: Approximately 6:12 PM  I have reviewed the triage vital signs and the nursing notes.   HISTORY  Chief Complaint Abdominal Pain   HPI Yolanda Wells is a 85 y.o. female history of colectomy for diverticulitis with ostomy bag, DVT/PE on Eliquis, CHF, hypertension, CVA who presents for evaluation of abdominal pain.  History is gathered mostly from patient's daughter who was at the bedside.  According to her patient had a very large explosive episode of diarrhea yesterday.  Daughter is a Marine scientist and reports that the smell made her concerned for C. difficile.  She called the home health nurse who asked for a specimen to be sent for analysis however patient did have another bowel movement of 2 today 4:00.  She again had another episode of large watery diarrhea and after that started complaining of abdominal pain.  At this time patient reports that her pain resolved.  She reports that the pain was diffuse.  She is unable to give me quality or intensity of the pain.  She has had nausea and one episode of nonbloody nonbilious emesis today as well.  No cough or congestion, no chest pain or shortness of breath, no dysuria or hematuria, no fever or chills.  Patient never had C. difficile before.  Daughter also reports that patient has been more out of it today, very sleepy and not as interactive as baseline. Daughter also noticed swelling of the RUE which started this am.    Past Medical History:  Diagnosis Date  . Acute deep vein thrombosis (DVT) of distal vein of left lower extremity ([place]) 10/03/2015  . Acute pulmonary embolism ([place]) 10/03/2015  . Anticoagulant long-term use   . Arthritis   . Arthritis   . CHF (congestive heart failure) ([place])   . Chronic anticoagulation 10/03/2015  . Collagen vascular disease ([place])   . Colostomy in place [facility]) 10/04/2015  . Deep venous  thrombosis ([facility])    right lower extremity  . Dependent edema   . Diverticulosis of intestine with bleeding 04/02/2015  . Glenohumeral arthritis 06/28/2012  . Hammertoe 09/02/2012  . History of bilateral hip replacements 09/02/2012  . History of hysterectomy   . Hypertension   . Hypertension   . Hypokalemia   . Inability to ambulate due to hip 10/12/2015  . Mandibular dysfunction   . Onychomycosis 09/02/2012  . Osteoarthritis   . Polycythemia vera ([place])   . Presence of IVC filter 05/25/2015  . Stroke ([place]) 03/26/2015   cerebellar  . Urinary incontinence in female 09/02/2016    Patient Active Problem List   Diagnosis Date Noted  . SOB (shortness of breath) 10/28/2018  . Shortness of breath 09/27/2018  . COPD exacerbation ([place]) 09/22/2018  . Sepsis ([place]) 09/22/2018  . Lower GI bleed 09/09/2018  . AMS (altered mental status) 08/30/2018  . Pressure injury of skin 08/13/2018  . B12 deficiency 08/04/2018  . Anemia 06/07/2018  . Secondary myelofibrosis ([place]) 02/17/2018  . Goals of care, counseling/discussion 12/23/2017  . Palliative care by specialist   . DNR (do not resuscitate) discussion   . Weakness generalized   . Chronic diastolic CHF (congestive heart failure) ([place]) 07/28/2017  . Vascular dementia without behavioral disturbance ([place]) 07/08/2017  . Influenza with respiratory manifestation 12/01/2016  . Respiratory distress 11/23/2016  . Urinary incontinence in female 09/02/2016  . Vision loss of right eye 08/26/2016  . Blindness of right eye 08/26/2016  . Inability  to ambulate due to hip 10/12/2015  . Colostomy in place [facility]) 10/04/2015  . Long term current use of anticoagulant therapy 10/03/2015  . Leukocytosis 09/25/2015  . CAP (community acquired pneumonia) 09/24/2015  . COPD (chronic obstructive pulmonary disease) ([place]) 09/24/2015  . UTI (urinary tract infection) 07/16/2015  . Abdominal wall cellulitis 07/15/2015  . DVT (deep venous thrombosis) ([place]) 07/15/2015    . HTN (hypertension) 07/15/2015  . Polycythemia vera ([place]) 07/15/2015  . Arthritis 07/15/2015  . Pulmonary emboli ([place]) 05/25/2015  . Diverticulosis of colon with hemorrhage 04/02/2015  . Diverticulosis of intestine with bleeding 04/02/2015  . Fall 01/10/2015  . Osteoarthritis 01/10/2015  . Hammertoe 09/02/2012  . S/P transmetatarsal amputation of foot ([place]) 09/02/2012  . Onychomycosis 09/02/2012  . Other specified dermatoses 09/02/2012  . S/P hip replacement 08/23/2012  . Glenohumeral arthritis 06/28/2012    Past Surgical History:  Procedure Laterality Date  . ABDOMINAL HYSTERECTOMY    . BLEPHAROPLASTY Right 03/2017   right upper eyelid  . CARDIAC CATHETERIZATION Right 04/03/2015   Procedure: CENTRAL LINE INSERTION;  Surgeon: Sherri Rad, MD;  Location: [facility] ORS;  Service: General;  Laterality: Right;  . COLECTOMY WITH COLOSTOMY CREATION/HARTMANN PROCEDURE N/A 04/03/2015   Procedure: COLECTOMY WITH COLOSTOMY CREATION/HARTMANN PROCEDURE;  Surgeon: Sherri Rad, MD;  Location: [facility] ORS;  Service: General;  Laterality: N/A;  . COLON SURGERY    . FOOT AMPUTATION     partial  . FOOT AMPUTATION Right   . IVC FILTER INSERTION    . JOINT REPLACEMENT     Left and Right Hip  . PERIPHERAL VASCULAR CATHETERIZATION N/A 04/02/2015   Procedure: Visceral Angiography;  Surgeon: Algernon Huxley, MD;  Location: [place] [facility];  Service: Cardiovascular;  Laterality: N/A;  . PERIPHERAL VASCULAR CATHETERIZATION N/A 04/02/2015   Procedure: Visceral Artery Intervention;  Surgeon: Algernon Huxley, MD;  Location: [place] [facility];  Service: Cardiovascular;  Laterality: N/A;  . PERIPHERAL VASCULAR CATHETERIZATION N/A 05/25/2015   Procedure: IVC Filter Insertion;  Surgeon: Katha Cabal, MD;  Location: [place] [facility];  Service: Cardiovascular;  Laterality: N/A;  . TOE AMPUTATION     right  . TOTAL HIP ARTHROPLASTY      Prior to Admission medications   Medication Sig Start Date End Date Taking?  Authorizing Provider  amLODipine (NORVASC) 5 MG tablet Take 2 tablets (10 mg total) by mouth daily. 10/31/18   Fritzi Mandes, MD  betamethasone valerate ointment (VALISONE) 0.1 % Apply 1 application topically 2 (two) times daily. 11/07/18 11/07/19  [provider]  cholecalciferol (VITAMIN D) 1000 units tablet Take 1,000 Units by mouth daily.    [provider]  ELIQUIS 5 MG TABS tablet TAKE ONE TABLET BY MOUTH TWICE A DAY Patient taking differently: Take 5 mg by mouth 2 (two) times daily.  11/04/18   Karen Kitchens, NP  estradiol (ESTRACE VAGINAL) 0.1 MG/GM vaginal cream Apply 0.5mg  (pea-sized amount)  just inside the vaginal introitus with a finger-tip on Monday, Wednesday and Friday nights. 08/09/18   Zara Council A, PA-C  folic acid (FOLVITE) 528 MCG tablet Take 400 mcg by mouth daily.    [provider]  ipratropium-albuterol (DUONEB) 0.5-2.5 (3) MG/3ML SOLN Take 3 mLs by nebulization every 4 (four) hours as needed (for shortness of breath/wheezing). 11/10/18   Sudini, Srikar, MD  JAKAFI 15 MG tablet TAKE 1 TABLET (15 MG TOTAL) BY MOUTH 2 TIMES DAILY. Patient taking differently: Take 15 mg by mouth 2 (two) times  daily.  10/28/18   Karen Kitchens, NP  ondansetron (ZOFRAN ODT) 4 MG disintegrating tablet Take 1 tablet (4 mg total) by mouth every 8 (eight) hours as needed. 11/26/18   Rudene Re, MD  pantoprazole (PROTONIX) 40 MG tablet Take 1 tablet (40 mg total) by mouth daily. 09/12/18   [name], MD  predniSONE (STERAPRED UNI-PAK 21 TAB) 10 MG (21) TBPK tablet 6 tabs day 1 and taper 10 mg a day - 6 days 11/10/18   Hillary Bow, MD    Allergies Patient has no known allergies.  Family History  Problem Relation Age of Onset  . Brain cancer Mother   . Heart attack Father   . COPD Father   . Coronary artery disease Father   . Hypertension Other   . Arthritis-Osteo Other   . Diabetes Brother   . Arthritis Brother   . Osteosarcoma Son   . Bone cancer  Son   . Arthritis Sister   . Arthritis Brother   . Hypertension Brother   . Coronary artery disease Brother   . Malignant hyperthermia Brother     Social History Social History   Tobacco Use  . Smoking status: Never Smoker  . Smokeless tobacco: Never Used  Substance Use Topics  . Alcohol use: No  . Drug use: No    Review of Systems  Constitutional: Negative for fever. Eyes: Negative for visual changes. ENT: Negative for sore throat. Neck: No neck pain  Cardiovascular: Negative for chest pain. Respiratory: Negative for shortness of breath. Gastrointestinal: + abdominal pain, vomiting and diarrhea. Genitourinary: Negative for dysuria. Musculoskeletal: Negative for back pain. Skin: Negative for rash. Neurological: Negative for headaches, weakness or numbness. Psych: No SI or HI  ____________________________________________   PHYSICAL EXAM:  VITAL SIGNS: ED Triage Vitals  Enc Vitals Group     BP 11/26/18 1650 (!) 168/90     Pulse Rate 11/26/18 1650 82     Resp 11/26/18 1650 16     Temp 11/26/18 1650 98.1 F (36.7 C)     Temp Source 11/26/18 1650 Oral     SpO2 11/26/18 1646 92 %     Weight --      Height --      Head Circumference --      Peak Flow --      Pain Score --      Pain Loc --      Pain Edu? --      Excl. in [place]? --     Constitutional: Sleeping, easily arousable, no distress. HEENT:      Head: Normocephalic and atraumatic.         Eyes: Conjunctivae are normal. Sclera is non-icteric.       Mouth/Throat: Mucous membranes are dry.       Neck: Supple with no signs of meningismus. Cardiovascular: Regular rate and rhythm. No murmurs, gallops, or rubs. 2+ symmetrical distal pulses are present in all extremities. No JVD. Respiratory: Normal respiratory effort. Lungs are clear to auscultation bilaterally. No wheezes, crackles, or rhonchi.  Gastrointestinal: Soft, non tender, and non distended with positive bowel sounds. No rebound or guarding. Ostomy bag  in place.  Genitourinary: No CVA tenderness. Musculoskeletal: Pitting edema of the RUE with no erythema. Nontender with normal range of motion in all extremities. No edema, cyanosis, or erythema of extremities. Neurologic: Normal speech and language. Face is symmetric. Moving all extremities. No gross focal neurologic deficits are appreciated. Skin: Skin is warm, dry and intact.  No rash noted. Psychiatric: Mood and affect are normal. Speech and behavior are normal.  ____________________________________________   LABS (all labs ordered are listed, but only abnormal results are displayed)  Labs Reviewed  CBC WITH DIFFERENTIAL/PLATELET - Abnormal; Notable for the following components:      Result Value   RBC 3.55 (*)    Hemoglobin 8.1 (*)    HCT 29.7 (*)    MCH 22.8 (*)    MCHC 27.3 (*)    RDW 31.0 (*)    nRBC 3.2 (*)    Abs Immature Granulocytes 1.17 (*)    All other components within normal limits  COMPREHENSIVE METABOLIC PANEL - Abnormal; Notable for the following components:   Creatinine, Ser 0.38 (*)    Calcium 7.8 (*)    Total Protein 5.2 (*)    Albumin 2.4 (*)    AST 13 (*)    Anion gap 3 (*)    All other components within normal limits  URINALYSIS, COMPLETE (UACMP) WITH MICROSCOPIC - Abnormal; Notable for the following components:   Color, Urine YELLOW (*)    APPearance CLEAR (*)    Bacteria, UA RARE (*)    All other components within normal limits  C DIFFICILE QUICK SCREEN W PCR REFLEX  URINE CULTURE  LIPASE, BLOOD   ____________________________________________  EKG  none  ____________________________________________  RADIOLOGY  I have personally reviewed the images performed during this visit and I agree with the Radiologist's read.   Interpretation by Radiologist:  Dg Abdomen 1 View  Result Date: 11/26/2018 CLINICAL DATA:  Nausea, vomiting and abdomen pain today. EXAM: ABDOMEN - 1 VIEW COMPARISON:  CT abdomen pelvis September 11, 2018 FINDINGS: The  bowel gas pattern is normal. IVC filter is identified. Degenerative joint changes of the spine are noted. Bilateral hip replacements are noted. IMPRESSION: No bowel obstruction identified.  No acute abnormality. Electronically Signed   By: Abelardo Diesel M.D.   On: 11/26/2018 18:03     ____________________________________________   PROCEDURES  Procedure(s) performed: None Procedures Critical Care performed:  None ____________________________________________   INITIAL IMPRESSION / ASSESSMENT AND PLAN / ED COURSE  85 y.o. female history of colectomy for diverticulitis with ostomy bag, DVT/PE on Eliquis, CHF, hypertension, CVA who presents for evaluation of abdominal pain, vomiting, and diarrhea.  Patient at this time is completely asymptomatic with no abdominal tenderness, not complaining of any pain.  Her vitals are within normal limits.  Her labs showed no leukocytosis, normal urinalysis, stable CBC, CMP and lipase.  Patient remains with no further episodes of abdominal pain.  Serial abdominal exams for the 3.5 hours the patient stayed here showed no tenderness on palpation.  Presentation most likely concerning for viral gastroenteritis.  She is tolerating p.o. with no further episodes of vomiting.    Doppler US pending to the RUE to eval for DVT. Care transferred to Dr. Cinda Quest.     As part of my medical decision making, I reviewed the following data within the [place] History obtained from family, Nursing notes reviewed and incorporated, Labs reviewed , Old chart reviewed, Radiograph reviewed , Notes from prior ED visits and [place] Controlled Substance Database    Pertinent labs & imaging results that were available during my care of the patient were reviewed by me and considered in my medical decision making (see chart for details).    ____________________________________________   FINAL CLINICAL IMPRESSION(S) / ED DIAGNOSES  Final diagnoses:  Generalized  abdominal pain  Nausea vomiting and  diarrhea  Arm swelling      NEW MEDICATIONS STARTED DURING THIS VISIT:  ED Discharge Orders         Ordered    ondansetron (ZOFRAN ODT) 4 MG disintegrating tablet  Every 8 hours PRN     11/26/18 2005           Note:  This document was prepared using Dragon voice recognition software and may include unintentional dictation errors.    Alfred Levins, [place], MD 11/26/18 2020    Rudene Re, MD 11/26/18 2033

## 2018-11-28 LAB — URINE CULTURE: Culture: 60000 — AB

## 2018-12-02 MED FILL — JAKAFI 15 MG TABLET: 15 | 30 days supply | Qty: 60 | Fill #1

## 2018-12-03 ENCOUNTER — Other Ambulatory Visit: Payer: Self-pay

## 2018-12-03 ENCOUNTER — Emergency Department
Admission: EM | Admit: 2018-12-03 | Discharge: 2018-12-03 | Disposition: A | Discharge status: Home / Self Care | Payer: Medicare Other | Attending: Emergency Medicine | Admitting: Emergency Medicine

## 2018-12-03 ENCOUNTER — Emergency Department: Payer: Medicare Other

## 2018-12-03 DIAGNOSIS — F0150 Vascular dementia without behavioral disturbance: Secondary | ICD-10-CM | POA: Insufficient documentation

## 2018-12-03 DIAGNOSIS — R1084 Generalized abdominal pain: Secondary | ICD-10-CM

## 2018-12-03 DIAGNOSIS — K5900 Constipation, unspecified: Secondary | ICD-10-CM | POA: Insufficient documentation

## 2018-12-03 DIAGNOSIS — J449 Chronic obstructive pulmonary disease, unspecified: Secondary | ICD-10-CM | POA: Insufficient documentation

## 2018-12-03 DIAGNOSIS — I110 Hypertensive heart disease with heart failure: Secondary | ICD-10-CM | POA: Insufficient documentation

## 2018-12-03 DIAGNOSIS — I5032 Chronic diastolic (congestive) heart failure: Secondary | ICD-10-CM | POA: Diagnosis not present

## 2018-12-03 DIAGNOSIS — R109 Unspecified abdominal pain: Secondary | ICD-10-CM | POA: Diagnosis present

## 2018-12-03 DIAGNOSIS — Z933 Colostomy status: Secondary | ICD-10-CM | POA: Diagnosis not present

## 2018-12-03 LAB — COMPREHENSIVE METABOLIC PANEL
ALT: 10 U/L (ref 0–44)
AST: 23 U/L (ref 15–41)
Albumin: 2.8 g/dL — ABNORMAL LOW (ref 3.5–5.0)
Alkaline Phosphatase: 59 U/L (ref 38–126)
Anion gap: 3 — ABNORMAL LOW (ref 5–15)
BUN: 10 mg/dL (ref 8–23)
CO2: 32 mmol/L (ref 22–32)
Calcium: 8.4 mg/dL — ABNORMAL LOW (ref 8.9–10.3)
Chloride: 107 mmol/L (ref 98–111)
Creatinine, Ser: 0.46 mg/dL (ref 0.44–1.00)
GFR calc Af Amer: >60 mL/min (ref >60)
GFR calc non Af Amer: >60 mL/min (ref >60)
Glucose, Bld: 93 mg/dL (ref 70–99)
POTASSIUM: 4.6 mmol/L (ref 3.5–5.1)
Sodium: 142 mmol/L (ref 135–145)
Total Bilirubin: 0.7 mg/dL (ref 0.3–1.2)
Total Protein: 6.4 g/dL — ABNORMAL LOW (ref 6.5–8.1)

## 2018-12-03 LAB — CBC
HCT: 32.9 % — ABNORMAL LOW (ref 36.0–46.0)
Hemoglobin: 9 g/dL — ABNORMAL LOW (ref 12.0–15.0)
MCH: 22.9 pg — ABNORMAL LOW (ref 26.0–34.0)
MCHC: 27.4 g/dL — ABNORMAL LOW (ref 30.0–36.0)
MCV: 83.7 fL (ref 80.0–100.0)
NRBC: 2.3 % — AB (ref 0.0–0.2)
PLATELETS: 244 10*3/uL (ref 150–400)
RBC: 3.93 MIL/uL (ref 3.87–5.11)
RDW: 31.4 % — ABNORMAL HIGH (ref 11.5–15.5)
WBC: 7.9 10*3/uL (ref 4.0–10.5)

## 2018-12-03 MED ORDER — LACTULOSE 10 GM/15ML PO SOLN
30.0000 g | Freq: Once | ORAL | Status: AC
  Administered 2018-12-03: 30 g via ORAL
  Filled 2018-12-03: qty 60

## 2018-12-03 MED ORDER — IOPAMIDOL (ISOVUE-300) INJECTION 61%
100.0000 mL | Freq: Once | INTRAVENOUS | Status: AC | PRN
  Administered 2018-12-03: 100 mL via INTRAVENOUS

## 2018-12-03 MED ORDER — ACETAMINOPHEN 325 MG PO TABS
650.0000 mg | ORAL_TABLET | Freq: Once | ORAL | Status: DC
  Filled 2018-12-03: qty 2

## 2018-12-03 MED ORDER — IOHEXOL 300 MG/ML  SOLN: 100.0000 mL | Freq: Once | INTRAMUSCULAR | Status: DC | PRN

## 2018-12-03 MED ORDER — SODIUM CHLORIDE 0.9 % IV BOLUS
1000.0000 mL | Freq: Once | INTRAVENOUS | Status: AC
  Administered 2018-12-03: 1000 mL via INTRAVENOUS

## 2018-12-03 NOTE — ED Notes (Signed)
Granddaughter contacted this RN expressing concerns about discharge. This RN went over testing and discharge paperwork with granddaughter again to reassure her of pt current status. granddaughter states that pt CNA will be at the residence to care for pt at 60, granddaughter plans to contact CNA to see if she can arrive early, and will contact this RN with a time for Pt to be transported to home.

## 2018-12-03 NOTE — ED Notes (Signed)
Pt verbalized discharge instructions and has no questions at this time 

## 2018-12-03 NOTE — ED Provider Notes (Signed)
[facility] Emergency Department Provider Note  ____________________________________________  Time seen: Approximately 10:14 AM  I have reviewed the triage vital signs and the nursing notes.   HISTORY  Chief Complaint Abdominal Pain    HPI Yolanda Wells is a 85 y.o. female status post remote colostomy 2016, multiple other chronic medical conditions including COPD on oxygen, presenting for abdominal pain with decreased ostomy output.  The patient reports that the last time her ostomy was putting out stool that was normal for her and consistency and quantity was 5 or 6 days ago.  Over the last week, she has only had small hard in stool and it has been having diffuse nonfocal abdominal pain without any nausea or vomiting, fevers or chills.  He has not tried anything for her constipation.  Past Medical History:  Diagnosis Date  . Acute deep vein thrombosis (DVT) of distal vein of left lower extremity ([place]) 10/03/2015  . Acute pulmonary embolism ([place]) 10/03/2015  . Anticoagulant long-term use   . Arthritis   . Arthritis   . CHF (congestive heart failure) ([place])   . Chronic anticoagulation 10/03/2015  . Collagen vascular disease ([place])   . Colostomy in place [facility]) 10/04/2015  . Deep venous thrombosis (H[facility])    right lower extremity  . Dependent edema   . Diverticulosis of intestine with bleeding 04/02/2015  . Glenohumeral arthritis 06/28/2012  . Hammertoe 09/02/2012  . History of bilateral hip replacements 09/02/2012  . History of hysterectomy   . Hypertension   . Hypertension   . Hypokalemia   . Inability to ambulate due to hip 10/12/2015  . Mandibular dysfunction   . Onychomycosis 09/02/2012  . Osteoarthritis   . Polycythemia vera ([place])   . Presence of IVC filter 05/25/2015  . 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 Urinary incontinence in female 09/02/2016    Patient Active Problem List   Diagnosis Date Noted  . SOB (shortness of breath)  10/28/2018  . Shortness of breath 09/27/2018  . COPD exacerbation ([place]) 09/22/2018  . Sepsis ([place]) 09/22/2018  . Lower GI bleed 09/09/2018  . AMS (altered mental status) 08/30/2018  . Pressure injury of skin 08/13/2018  . B12 deficiency 08/04/2018  . Anemia 06/07/2018  . Secondary myelofibrosis ([place]) 02/17/2018  . Goals of care, counseling/discussion 12/23/2017  . Palliative care by specialist   . DNR (do not resuscitate) discussion   . Weakness generalized   . Chronic diastolic CHF (congestive heart failure) ([place]) 07/28/2017  . Vascular dementia without behavioral disturbance ([place]) 07/08/2017  . Influenza with respiratory manifestation 12/01/2016  . Respiratory distress 11/23/2016  . Urinary incontinence in female 09/02/2016  . Vision loss of right eye 08/26/2016  . Blindness of right eye 08/26/2016  . Inability to ambulate due to hip 10/12/2015  . Colostomy in place [facility]) 10/04/2015  . Long term current use of anticoagulant therapy 10/03/2015  . Leukocytosis 09/25/2015  . CAP (community acquired pneumonia) 09/24/2015  . COPD (chronic obstructive pulmonary disease) ([place]) 09/24/2015  . UTI (urinary tract infection) 07/16/2015  . Abdominal wall cellulitis 07/15/2015  . DVT (deep venous thrombosis) ([place]) 07/15/2015  . HTN (hypertension) 07/15/2015  . Polycythemia vera ([place]) 07/15/2015  . Arthritis 07/15/2015  . Pulmonary emboli ([place]) 05/25/2015  . Diverticulosis of colon with hemorrhage 04/02/2015  . Diverticulosis of intestine with bleeding 04/02/2015  . Fall 01/10/2015  . Osteoarthritis 01/10/2015  . Hammertoe 09/02/2012  . S/P transmetatarsal amputation of foot ([place]) 09/02/2012  . Onychomycosis 09/02/2012  .  Other specified dermatoses 09/02/2012  . S/P hip replacement 08/23/2012  . Glenohumeral arthritis 06/28/2012    Past Surgical History:  Procedure Laterality Date  . ABDOMINAL HYSTERECTOMY    . BLEPHAROPLASTY Right 03/2017   right upper eyelid  . CARDIAC  CATHETERIZATION Right 04/03/2015   Procedure: CENTRAL LINE INSERTION;  Surgeon: Sherri Rad, MD;  Location: [facility] ORS;  Service: General;  Laterality: Right;  . COLECTOMY WITH COLOSTOMY CREATION/HARTMANN PROCEDURE N/A 04/03/2015   Procedure: COLECTOMY WITH COLOSTOMY CREATION/HARTMANN PROCEDURE;  Surgeon: Sherri Rad, MD;  Location: [facility] ORS;  Service: General;  Laterality: N/A;  . COLON SURGERY    . FOOT AMPUTATION     partial  . FOOT AMPUTATION Right   . IVC FILTER INSERTION    . JOINT REPLACEMENT     Left and Right Hip  . PERIPHERAL VASCULAR CATHETERIZATION N/A 04/02/2015   Procedure: Visceral Angiography;  Surgeon: Algernon Huxley, MD;  Location: [place] [facility];  Service: Cardiovascular;  Laterality: N/A;  . PERIPHERAL VASCULAR CATHETERIZATION N/A 04/02/2015   Procedure: Visceral Artery Intervention;  Surgeon: Algernon Huxley, MD;  Location: [place] [facility];  Service: Cardiovascular;  Laterality: N/A;  . PERIPHERAL VASCULAR CATHETERIZATION N/A 05/25/2015   Procedure: IVC Filter Insertion;  Surgeon: Katha Cabal, MD;  Location: [place] [facility];  Service: Cardiovascular;  Laterality: N/A;  . TOE AMPUTATION     right  . TOTAL HIP ARTHROPLASTY      Current Outpatient Rx  . Order #: [id number] Class: Normal  . Order #: [id number] Class: Historical Med  . Order #: [id number] Class: Historical Med  . Order #: [id number] Class: Normal  . Order #: [id number] Class: Print  . Order #: [id number] Class: Historical Med  . Order #: [id number] Class: Normal  . Order #: [id number] Class: Normal  . Order #: [id number] Class: Print  . Order #: [id number] Class: Normal  . Order #: [id number] Class: Normal    Allergies Patient has no known allergies.  Family History  Problem Relation Age of Onset  . Brain cancer Mother   . Heart attack Father   . COPD Father   . Coronary artery disease Father   . Hypertension Other   . Arthritis-Osteo Other   . Diabetes Brother   . Arthritis Brother   . Osteosarcoma Son    . Bone cancer Son   . Arthritis Sister   . Arthritis Brother   . Hypertension Brother   . Coronary artery disease Brother   . Malignant hyperthermia Brother     Social History Social History   Tobacco Use  . Smoking status: Never Smoker  . Smokeless tobacco: Never Used  Substance Use Topics  . Alcohol use: No  . Drug use: No    Review of Systems Constitutional: No fever/chills.   Eyes: No visual changes. ENT: No sore throat. No congestion or rhinorrhea. Cardiovascular: Denies chest pain. Denies palpitations. Respiratory: Denies new shortness of breath.  No cough.  Supplemental oxygen, unchanged. Gastrointestinal: Positive diffuse nonfocal abdominal pain.  No nausea, no vomiting.  No diarrhea.  Positive decreased ostomy output and constipation. Genitourinary: Negative for dysuria. Musculoskeletal: Negative for back pain. Skin: Negative for rash. Neurological: Negative for headaches. No focal numbness, tingling or weakness.     ____________________________________________   PHYSICAL EXAM:  VITAL SIGNS: ED Triage Vitals  Enc Vitals Group     BP 12/03/18 1000 (!) 154/75     Pulse Rate 12/03/18 1000 75     Resp 12/03/18 1000 20     Temp 12/03/18  1000 98.1 F (36.7 C)     Temp Source 12/03/18 1000 Oral     SpO2 12/03/18 1000 95 %     Weight 12/03/18 0959 220 lb (99.8 kg)     Height 12/03/18 0959 5\' 7"  (1.702 m)     Head Circumference --      Peak Flow --      Pain Score 12/03/18 0958 8     Pain Loc --      Pain Edu? --      Excl. in [place]? --     Constitutional: Alert and oriented.  Answers questions appropriately.  Chronically ill-appearing. Eyes: Conjunctivae are normal.  EOMI. No scleral icterus. Head: Atraumatic. Nose: No congestion/rhinnorhea. Mouth/Throat: Mucous membranes are mildly dry..  Neck: No stridor.  Supple.  No JVD.  No meningismus. Cardiovascular: Normal rate, regular rhythm. No murmurs, rubs or gallops.  Respiratory: Normal respiratory  effort.  No accessory muscle use or retractions. Lungs CTAB.  No wheezes, rales or ronchi. Gastrointestinal: Obese.  Soft, and minimally distended.  The patient has diffuse nonfocal tenderness to palpation.  She has an ostomy on the left side which has hardened stool at the output but otherwise no air or stool in the bag..  No guarding or rebound.  No peritoneal signs. Musculoskeletal: No LE edema.  Neurologic:  A&Ox3.  Speech is clear.  Face and smile are symmetric.  EOMI.  Moves all extremities well. Skin:  Skin is warm, dry and intact. No rash noted. Psychiatric: Mood and affect are normal. Speech and behavior are normal.  Normal judgement.**}  ____________________________________________   LABS (all labs ordered are listed, but only abnormal results are displayed)  Labs Reviewed  CBC - Abnormal; Notable for the following components:      Result Value   Hemoglobin 9.0 (*)    HCT 32.9 (*)    MCH 22.9 (*)    MCHC 27.4 (*)    RDW 31.4 (*)    nRBC 2.3 (*)    All other components within normal limits  COMPREHENSIVE METABOLIC PANEL - Abnormal; Notable for the following components:   Calcium 8.4 (*)    Total Protein 6.4 (*)    Albumin 2.8 (*)    Anion gap 3 (*)    All other components within normal limits  URINALYSIS, COMPLETE (UACMP) WITH MICROSCOPIC   ____________________________________________  EKG  ED ECG REPORT I, Anne-Caroline Mariea Clonts, the attending physician, personally viewed and interpreted this ECG.   Date: 12/03/2018  EKG Time: 1013  Rate: 72  Rhythm: normal sinus rhythm; the computer printout says this is atrial flutter, but this is likely due to poor baseline tracing; the patient is in normal sinus rhythm.  Axis: leftward  Intervals:none  ST&T Change: No STEMI  ____________________________________________  RADIOLOGY  Ct Abdomen Pelvis W Contrast  Result Date: 12/03/2018 CLINICAL DATA:  Abdominal pain, decreased colostomy output EXAM: CT ABDOMEN AND PELVIS  WITH CONTRAST TECHNIQUE: Multidetector CT imaging of the abdomen and pelvis was performed using the standard protocol following bolus administration of intravenous contrast. CONTRAST:  137mL ISOVUE-300 IOPAMIDOL (ISOVUE-300) INJECTION 61% COMPARISON:  09/11/2018 FINDINGS: Lower chest: Similar chronic basilar atelectasis/partial collapse worse on the right. Heart is enlarged. Mitral valve annulus calcifications noted. No pericardial pleural effusion. Small hiatal hernia. Degenerative changes of the lower thoracic spine. Hepatobiliary: No focal hepatic abnormality. Gallbladder is collapsed and contains several stones as before. No biliary dilatation or obstruction. Common bile duct nondilated. Pancreas: Unremarkable. No pancreatic ductal dilatation or surrounding  inflammatory changes. Spleen: Normal in size without focal abnormality. Adrenals/Urinary Tract: Normal adrenal glands. Bilateral renal cysts, larger on the right, unchanged. No renal obstruction or hydronephrosis. Large right parapelvic cysts measure 7.4 cm. No hydroureter, ureteral calculus, or definite bladder abnormality. Limited assessment the bladder because artifact from the bilateral hip arthroplasties. Stomach/Bowel: Negative for significant obstruction pattern, dilatation, ileus, free air. Left abdominal colostomy noted. Extensive long Hartmann's pouch with diverticulosis. There is a small peri stomal hernia containing a loop of small bowel without evidence of significant dilatation, obstruction or incarceration. This is similar to the prior study. No other surrounding stomal abnormality by CT. Normal appendix. No fluid collection, abscess, hemorrhage, hematoma, or ascites. Vascular/Lymphatic: Similar atherosclerotic changes. No occlusive process. Iliac vessels are also atherosclerotic and tortuous. No inflow disease or iliac obstruction. Mesenteric and renal vasculature demonstrate atherosclerotic changes but remain patent. IVC noted in the  infrarenal IVC. No veno-occlusive process or thrombosis. No adenopathy. Reproductive: Status post hysterectomy. No adnexal masses. Other: Negative for ascites.  No large ventral abdominal hernia. Musculoskeletal: Diffuse muscular atrophy. Advanced degenerative changes of the spine. Bilateral hip arthroplasties create artifact. No acute osseous finding or compression fracture. IMPRESSION: Similar chronic bibasilar atelectasis/partial lower lobe collapse, worse on the right. Cholelithiasis as before. Stable bilateral renal cysts, larger on right Stable peri stomal hernia containing a loop of bowel without evidence of obstruction or incarceration No acute intra-abdominal or pelvic finding by CT. Overall no significant interval change. Electronically Signed   By: Jerilynn Mages.  Shick M.D.   On: 12/03/2018 12:23    ____________________________________________   PROCEDURES  Procedure(s) performed: None  Procedures  Critical Care performed: No ____________________________________________   INITIAL IMPRESSION / ASSESSMENT AND PLAN / ED COURSE  Pertinent labs & imaging results that were available during my care of the patient were reviewed by me and considered in my medical decision making (see chart for details).  85 y.o. female with a remote history of colostomy presenting with decreased colostomy output.  Overall, the patient is chronically ill-appearing but not in any extremitas and afebrile.  She has diffuse nonfocal abdominal discomfort with some mild distention, which is concerning for possible obstruction, ileus or gas.  I will plan to do CT scan of the abdomen for further evaluation.  The patient will receive intravenous fluids.  I have considered a stool softener, lactulose, but will hold off until we have the results of the CT scan.  Plan reevaluation for final disposition.  ----------------------------------------- 2:47 PM on 12/03/2018 -----------------------------------------  The patient's  work-up in the emergency department has been reassuring.  Her pain has improved, her ostomy has started to put out liquid brown stool, and she has remained hemodynamically stable and afebrile.  Her CT scan did not show any acute process, including obstruction.  At this time, the patient is safe for discharge home.  Return precautions as well as follow-up instructions were discussed.    Her CT scan does show stable pulmonary abnormalities; the patient is satting normally on her baseline oxygen so no acute intervention is required at this time. ____________________________________________  FINAL CLINICAL IMPRESSION(S) / ED DIAGNOSES  Final diagnoses:  Constipation, unspecified constipation type  Generalized abdominal pain         NEW MEDICATIONS STARTED DURING THIS VISIT:  New Prescriptions   No medications on file      Eula Listen, MD 12/03/18 1448

## 2018-12-03 NOTE — ED Triage Notes (Signed)
Pt arrived via EMS from home with c/o abdominal pain. EMS reports pt colostomy bag had ruptured on Wednesday 12/01/18 and has not had a BM through colostomy since. Pt has hardened stool around colostomy opening at this time. Pt is A&O x 4 and denies any N/V.

## 2018-12-03 NOTE — ED Notes (Signed)
Report given to Lorrie RN 

## 2018-12-03 NOTE — Discharge Instructions (Addendum)
Please drink plenty of fluids, stay active, and eat a high-fiber diet to prevent constipation.  May start by taking a clear liquid diet for the next 24 hours, then advance to a high-fiber diet as described in this paperwork.  Return to the emergency department if you develop severe pain, nausea and vomiting, fever, or any other symptoms concerning to you.

## 2018-12-03 NOTE — ED Notes (Signed)
Pt cleaned up and repositioned at this time. Pt given warm blankets. Pt resting comfortably.

## 2018-12-03 NOTE — ED Notes (Signed)
RN notified granddaughter of pt status and informed her EMS is here to transport pt home at this time

## 2018-12-03 NOTE — ED Notes (Signed)
Pt wears 2L O2 chronically at home. O2 sat upon arrival was 87% on RA. Pt is now 96% O2 on 2L via nasal canula

## 2018-12-03 NOTE — ED Notes (Addendum)
Called Granddaughter [name]'s cell and home number and left messages regarding pt's discharge.

## 2018-12-03 NOTE — ED Notes (Signed)
Pt ostomy leaking around bag. Bag changed and pt cleaned at this time

## 2018-12-09 ENCOUNTER — Telehealth: Payer: Self-pay | Admitting: Pharmacy Technician

## 2018-12-09 NOTE — Telephone Encounter (Signed)
Oral Oncology Patient Advocate Encounter  Honor Loh, NP, submitted a prior authorization to [employer] for [place].  Prior authorization was approved.  CoverMyMeds Key: [id number]  Approval dates: 11/08/18 to 12/09/2019.  [place] [facility] [place] Phone [phone] Fax [phone] 12/09/2018 11:04 AM

## 2018-12-14 ENCOUNTER — Telehealth: Payer: Self-pay

## 2018-12-14 NOTE — Telephone Encounter (Signed)
Left message on the patient voice mail to see when would be a great time for the patient to come in. We having open spots as Friday at 11:30 and anything after 3 pm.

## 2018-12-15 ENCOUNTER — Inpatient Hospital Stay: Attending: Hematology and Oncology | Admitting: Hematology and Oncology

## 2018-12-15 ENCOUNTER — Other Ambulatory Visit: Payer: Self-pay | Admitting: Hematology and Oncology

## 2018-12-15 DIAGNOSIS — D649 Anemia, unspecified: Secondary | ICD-10-CM

## 2018-12-15 MED ORDER — FOLIC ACID 1 MG PO TABS
1.00 | ORAL_TABLET | ORAL | Status: DC
Start: 2018-12-15 — End: 2018-12-15

## 2018-12-15 MED ORDER — AMLODIPINE BESYLATE 5 MG PO TABS
5.00 | ORAL_TABLET | ORAL | Status: DC
Start: 2018-12-15 — End: 2018-12-15

## 2018-12-15 MED ORDER — BISACODYL 5 MG PO TBEC
5.00 | DELAYED_RELEASE_TABLET | ORAL | Status: DC
Start: 2018-12-15 — End: 2018-12-15

## 2018-12-15 MED ORDER — FUROSEMIDE 20 MG PO TABS
20.00 | ORAL_TABLET | ORAL | Status: DC
Start: 2018-12-15 — End: 2018-12-15

## 2018-12-15 MED ORDER — MELATONIN 3 MG PO TABS
3.00 | ORAL_TABLET | ORAL | Status: DC
Start: ? — End: 2018-12-15

## 2018-12-15 MED ORDER — IPRATROPIUM-ALBUTEROL 0.5-2.5 (3) MG/3ML IN SOLN
3.00 | RESPIRATORY_TRACT | Status: DC
Start: ? — End: 2018-12-15

## 2018-12-15 MED ORDER — ACETAMINOPHEN 325 MG PO TABS
650.00 | ORAL_TABLET | ORAL | Status: DC
Start: ? — End: 2018-12-15

## 2018-12-15 MED ORDER — POLYETHYLENE GLYCOL 3350 17 G PO PACK
17.00 | PACK | ORAL | Status: DC
Start: ? — End: 2018-12-15

## 2018-12-15 MED ORDER — APIXABAN 5 MG PO TABS
5.00 | ORAL_TABLET | ORAL | Status: DC
Start: 2018-12-15 — End: 2018-12-15

## 2018-12-15 MED ORDER — GABAPENTIN 100 MG PO CAPS
100.00 | ORAL_CAPSULE | ORAL | Status: DC
Start: 2018-12-14 — End: 2018-12-15

## 2018-12-15 MED ORDER — GENERIC EXTERNAL MEDICATION
Status: DC
Start: ? — End: 2018-12-15

## 2018-12-15 NOTE — Progress Notes (Deleted)
[facility] day:  12/15/2018   Chief Complaint: Yolanda Wells is a 85 y.o. female with polycythemia rubra vera (PV) on [place] who is seen for 6 month reassessment.  HPI:   The patient was last seen in the medical oncology clinic on 06/07/2018.  At that time, she denied any B symptoms.  Exam revealed no adenopathy or hepatosplenomegaly.  Hemoglobin was 9.5.  Platelets were 361,000.  WBC was 7400 ([place] 5700).  She was on Jakafi 15 mg BID.  Additional labs included a ferritin of 99, iron saturation was 22% with a TIBC of 213.  B12 was 258 (low).  Folate was 34.  Sed rate was 49.  TSH was 1.823.  Retic was 3.5%.   She was scheduled to be seen in follow-up in 1 month.  She has been in and out of the hospital.  She has been admitted to [facility] x 8 times.  Last admission on 11/08/2018 was for a COPD exacerbation.  She received steroids and nebulizers.  Notes indicated she was on Hospice services at home.  Se was admitted to [facility] in [place] from 12/05/2018 - 12/10/2018 with slow transit constipation. She was started on a regimen of MiraLAX 3 times a day.  She developed fever to 38.5 and moderate altered mental status with encephalopathy that was thought to be due to a urinary tract infection. She was started on broad spectrum empiric antibiotics and improved promptly.  Her urine culture grew polymicrobial organisms. She completed a course of intravenous antibiotics while hospitalized and her mental status returned to baseline.  Her hemoglobin was below baseline. She continued on folic acid therapy. She remained on Jakafi.  Labs on 12/14/2018 revealed a hematocrit of 30.0, hemoglobin 8.3, MCV 80.4, platelets 183,0000, WBC 8400.  Ferritin was 82.3.  Iron was 26 (low).  Sed rate was 53.  Creatinine was 0.33.  During the interim,   Past Medical History:  Diagnosis Date  . Acute deep vein thrombosis (DVT) of distal vein of left lower extremity ([place]) 10/03/2015  .  Acute pulmonary embolism ([place]) 10/03/2015  . Anticoagulant long-term use   . Arthritis   . Arthritis   . CHF (congestive heart failure) ([place])   . Chronic anticoagulation 10/03/2015  . Collagen vascular disease ([place])   . Colostomy in place [facility]) 10/04/2015  . Deep venous thrombosis ([facility])    right lower extremity  . Dependent edema   . Diverticulosis of intestine with bleeding 04/02/2015  . Glenohumeral arthritis 06/28/2012  . Hammertoe 09/02/2012  . History of bilateral hip replacements 09/02/2012  . History of hysterectomy   . Hypertension   . Hypertension   . Hypokalemia   . Inability to ambulate due to hip 10/12/2015  . Mandibular dysfunction   . Onychomycosis 09/02/2012  . Osteoarthritis   . Polycythemia vera ([place])   . Presence of IVC filter 05/25/2015  . 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 Urinary incontinence in female 09/02/2016    Past Surgical History:  Procedure Laterality Date  . ABDOMINAL HYSTERECTOMY    . BLEPHAROPLASTY Right 03/2017   right upper eyelid  . CARDIAC CATHETERIZATION Right 04/03/2015   Procedure: CENTRAL LINE INSERTION;  Surgeon: Sherri Rad, MD;  Location: [facility] ORS;  Service: General;  Laterality: Right;  . COLECTOMY WITH COLOSTOMY CREATION/HARTMANN PROCEDURE N/A 04/03/2015   Procedure: COLECTOMY WITH COLOSTOMY CREATION/HARTMANN PROCEDURE;  Surgeon: Sherri Rad, MD;  Location: [facility] ORS;  Service: General;  Laterality: N/A;  .  COLON SURGERY    . FOOT AMPUTATION     partial  . FOOT AMPUTATION Right   . IVC FILTER INSERTION    . JOINT REPLACEMENT     Left and Right Hip  . PERIPHERAL VASCULAR CATHETERIZATION N/A 04/02/2015   Procedure: Visceral Angiography;  Surgeon: Algernon Huxley, MD;  Location: [place] [facility];  Service: Cardiovascular;  Laterality: N/A;  . PERIPHERAL VASCULAR CATHETERIZATION N/A 04/02/2015   Procedure: Visceral Artery Intervention;  Surgeon: Algernon Huxley, MD;  Location: [place] [facility];  Service: Cardiovascular;   Laterality: N/A;  . PERIPHERAL VASCULAR CATHETERIZATION N/A 05/25/2015   Procedure: IVC Filter Insertion;  Surgeon: Katha Cabal, MD;  Location: [facility];  Service: Cardiovascular;  Laterality: N/A;  . TOE AMPUTATION     right  . TOTAL HIP ARTHROPLASTY      Family History  Problem Relation Age of Onset  . Brain cancer Mother   . Heart attack Father   . COPD Father   . Coronary artery disease Father   . Hypertension Other   . Arthritis-Osteo Other   . Diabetes Brother   . Arthritis Brother   . Osteosarcoma Son   . Bone cancer Son   . Arthritis Sister   . Arthritis Brother   . Hypertension Brother   . Coronary artery disease Brother   . Malignant hyperthermia Brother     Social History:  reports that she has never smoked. She has never used smokeless tobacco. She reports that she does not drink alcohol or use drugs.  She lives alone on [address] at [facility] (independent living).  She moved out of [facility].  She has been living with her grand daughter since 02/06/2017.  Her grand-daughter visits regularly [name] [phone]).  The patient is accompanied by her grand daughter, [name], today.   Allergies:  No Known Allergies  Current Medications: Current Outpatient Medications  Medication Sig Dispense Refill  . amLODipine (NORVASC) 5 MG tablet Take 2 tablets (10 mg total) by mouth daily. 30 tablet 1  . betamethasone valerate ointment (VALISONE) 0.1 % Apply 1 application topically 2 (two) times daily.    . cholecalciferol (VITAMIN D) 1000 units tablet Take 1,000 Units by mouth daily.    [name] ELIQUIS 5 MG TABS tablet TAKE ONE TABLET BY MOUTH TWICE A DAY (Patient taking differently: Take 5 mg by mouth 2 (two) times daily. ) 120 tablet 0  . estradiol (ESTRACE VAGINAL) 0.1 MG/GM vaginal cream Apply 0.5mg  (pea-sized amount)  just inside the vaginal introitus with a finger-tip on Monday, Wednesday and Friday nights. 30 g 12  . folic acid (FOLVITE) 505 MCG  tablet Take 400 mcg by mouth daily.    [name] ipratropium-albuterol (DUONEB) 0.5-2.5 (3) MG/3ML SOLN Take 3 mLs by nebulization every 4 (four) hours as needed (for shortness of breath/wheezing). 360 mL 0  . JAKAFI 15 MG tablet TAKE 1 TABLET (15 MG TOTAL) BY MOUTH 2 TIMES DAILY. (Patient taking differently: Take 15 mg by mouth 2 (two) times daily. ) 60 tablet 1  . ondansetron (ZOFRAN ODT) 4 MG disintegrating tablet Take 1 tablet (4 mg total) by mouth every 8 (eight) hours as needed. 20 tablet 0  . pantoprazole (PROTONIX) 40 MG tablet Take 1 tablet (40 mg total) by mouth daily. 30 tablet 0  . predniSONE (STERAPRED UNI-PAK 21 TAB) 10 MG (21) TBPK tablet 6 tabs day 1 and taper 10 mg a day - 6 days 21 tablet 0  No current facility-administered medications for this visit.     Review of Systems  Constitutional: Negative.  Negative for diaphoresis, fever, malaise/fatigue and weight loss.  HENT: Negative.   Eyes: Negative for pain and redness.       Decreased vision in the RIGHT eye s/p embolic events.  Respiratory: Positive for shortness of breath (exertional). Negative for cough, hemoptysis and sputum production.   Cardiovascular: Negative.  Negative for chest pain, palpitations, orthopnea, leg swelling and PND.  Gastrointestinal: Negative for abdominal pain, blood in stool, constipation, diarrhea, melena, nausea and vomiting.       Decreased appetite.  Colostomy.  Genitourinary: Negative for dysuria, frequency, hematuria and urgency.       Incontinence; wears briefs. Recurrent UTIs.  Musculoskeletal: Negative for back pain, falls, joint pain and myalgias.  Skin: Negative.  Negative for itching and rash.  Neurological: Negative.  Negative for dizziness, tremors, sensory change, focal weakness, weakness and headaches.  Endo/Heme/Allergies: Bruises/bleeds easily (on apixaban).  Psychiatric/Behavioral: Negative for depression, memory loss and suicidal ideas. The patient is not nervous/anxious and does not  have insomnia.   All other systems reviewed and are negative.  Performance status (ECOG): 3  Vital Signs There were no vitals taken for this visit.  Physical Exam  Constitutional: She is oriented to person, place, and time and well-developed, well-nourished, and in no distress.  HENT:  Head: Normocephalic and atraumatic.  Gray hair  Eyes: Pupils are equal, round, and reactive to light. EOM are normal. No scleral icterus.  Brown eyes  Neck: Normal range of motion. Neck supple. No tracheal deviation present. No thyromegaly present.  Cardiovascular: Normal rate, regular rhythm and normal heart sounds. Exam reveals no gallop and no friction rub.  No murmur heard. Pulmonary/Chest: Effort normal and breath sounds normal. No respiratory distress. She has no wheezes. She has no rales.  Abdominal: Soft. Bowel sounds are normal. She exhibits no distension. There is no abdominal tenderness.  LEFT sided colostomy with brown liquid stool noted.  Musculoskeletal: Normal range of motion.        General: Edema (chronic BLE) present. No tenderness.  Lymphadenopathy:    She has no cervical adenopathy.    She has no axillary adenopathy.       Right: No inguinal and no supraclavicular adenopathy present.       Left: No inguinal and no supraclavicular adenopathy present.  Neurological: She is alert and oriented to person, place, and time.  Skin: Skin is warm and dry. No rash noted. No erythema.  Psychiatric: Mood, affect and judgment normal.  Nursing note and vitals reviewed.   No visits with results within 3 Day(s) from this visit.  Latest known visit with results is:  Admission on 12/03/2018, Discharged on 12/03/2018  Component Date Value Ref Range Status  . WBC 12/03/2018 7.9  4.0 - 10.5 K/uL Final  . RBC 12/03/2018 3.93  3.87 - 5.11 MIL/uL Final  . Hemoglobin 12/03/2018 9.0* 12.0 - 15.0 g/dL Final  . HCT 12/03/2018 32.9* 36.0 - 46.0 % Final  . MCV 12/03/2018 83.7  80.0 - 100.0 fL Final  .  MCH 12/03/2018 22.9* 26.0 - 34.0 pg Final  . MCHC 12/03/2018 27.4* 30.0 - 36.0 g/dL Final  . RDW 12/03/2018 31.4* 11.5 - 15.5 % Final  . Platelets 12/03/2018 244  150 - 400 K/uL Final  . nRBC 12/03/2018 2.3* 0.0 - 0.2 % Final   Performed at [facility], [address], [place],  [address]  .  Sodium 12/03/2018 142  135 - 145 mmol/L Final  . Potassium 12/03/2018 4.6  3.5 - 5.1 mmol/L Final   HEMOLYSIS AT THIS LEVEL MAY AFFECT RESULT  . Chloride 12/03/2018 107  98 - 111 mmol/L Final  . CO2 12/03/2018 32  22 - 32 mmol/L Final  . Glucose, Bld 12/03/2018 93  70 - 99 mg/dL Final  . BUN 12/03/2018 10  8 - 23 mg/dL Final  . Creatinine, Ser 12/03/2018 0.46  0.44 - 1.00 mg/dL Final  . Calcium 12/03/2018 8.4* 8.9 - 10.3 mg/dL Final  . Total Protein 12/03/2018 6.4* 6.5 - 8.1 g/dL Final  . Albumin 12/03/2018 2.8* 3.5 - 5.0 g/dL Final  . AST 12/03/2018 23  15 - 41 U/L Final  . ALT 12/03/2018 10  0 - 44 U/L Final  . Alkaline Phosphatase 12/03/2018 59  38 - 126 U/L Final  . Total Bilirubin 12/03/2018 0.7  0.3 - 1.2 mg/dL Final  . GFR calc non Af Amer 12/03/2018 >60  >60 mL/min Final  . GFR calc Af Amer 12/03/2018 >60  >60 mL/min Final  . Anion gap 12/03/2018 3* 5 - 15 Final   Performed at [facility], [address] [place], [place] [address]    Assessment:  Yolanda Wells is a 85 y.o. female African-American woman with JAK2+ polycythemia rubra vera (PV) previously on a phlebotomy program and hydroxyurea. She received P32 in an attempt to manage her counts in 03/2015 and on 05/14/2017.  Bone marrow on 01/07/2018 revealed a hypercellular bone marrow with a myeloproliferative neoplasm.  There was mild to moderate reticulin fibrosis.  Flow cytometry revealed no increased blasts.  Cytogenetics revealed 58, XX, der(15)t(1;15)(q11;p11).  The der 15t(1;15) has been associated with polycythemia, AML, and MDS.  Course has been complicated by a cerebellar CVA on 04/01/2015, splenic  flexure bleeding requiring micro-embolization then colectomy on 04/03/2015. She was diagnosed with bilateral lower extremity DVTs on 05/18/2015 and bilateral pulmonary emboli on 05/24/2015. She underwent IVC filter placement on 05/25/2015.  She has been on a fluctuating dose of Coumadin secondary to unstable INR.  She is on a baby aspirin.  She developed progressive erythrocytosis, thrombocytosis, and leukocytosis.  She underwent phlebotomy for a hematocrit of 55.4 on 09/23/2015.  Hematocrit decreased to 50.0, but has again increased after initiation of oral iron.  Platelet count has increased from 1.1 million to 1.4 million.  White count increased from 23,000 - 28,000 to 31,800.  She was admitted on 08/25/2016 with loss of vision in her right eye.  CBC on 08/25/2016 revealed a hematocrit of 40.0, hemoglobin 13.5, MCV 91, platelets 842,000, WBC 4900.  INR was 2.59.  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  MRI and MRA showed no acute changes.  She has been back on hydroxyurea since 10/02/2015.  Initial dose was 1000 mg a day.  Maximum dose was 4 pills/day (total weekly dose: 28 pills).   Hydroxyurea was discontinued on 03/29/2018.  She began Jakafi 10 mg BID on 02/02/2018 (increased to 15 mg BID on 03/10/2018).  She requires periodic phlebotomies (goal hematocrit <= 42).  Platelet count remains elevated (secondary to PV and likely some component of iron deficiency).  Goal platelet count is 400,000.  She was on Coumadin (discontinued secondary to clot and subtherapeutic INR).  She began Eliquis on 02/05/2018.  She was admitted to [facility] for 2.5 weeks with pneumonia then returned 07/03/2017 - 07/12/2017 with CHF.  She was admitted to [facility] from  09/26/2017 - 10/09/2017 with symptoms of heart failure and an E coli UTI.  She was treated with Lasix and ceftriaxone.  Echo revealed an EF of 60%.  CXR suggested pulmonary artery hypertension.    She was  admitted to [facility] from 02/23/2018 - 02/26/2018 with abdominal pain and hypoxia.  She was felt to have decompensated CHF.  She was diuresed and her hypoxia resolved.  Urine culture was + E coli.  She was discharged on a 5 day course of Omnicef.  Symptomatically, she denies any B symptoms.  Exam reveals no adenopathy or hepatosplenomegaly.  Hemoglobin is 9.5.  Platelets are 361,000.  WBC is 7400 ([place] 5700).  Plan: 1. Labs today:  CBC with diff, iron studies, TSH, B12, folate, retic.   2. Discuss recent ED visits and recurrent UTIs. 3. Anemia:  Etiology likely multi-factorial.  Additional work up being added today to further assess.   If iron deficiency anemia documented (from prior phlebotomies), begin ferrous sulfate 2x/week. 4. JAK2 (+) polycythemia rubra vera:  Platelets stable at 361,000.   Goal is to maintain a platelet count of < 400,000 and a hematocrit of < 42.   Continue Jakafi 15 mg BID.  5. Anticoagulation therapy:  No significant bruising or bleeding.  Continue apixaban 5 mg BID as previously prescribed.  6. Continue routine lab monitoring with home health. Needs new orders for CBC with diff every 2 weeks to be sent to home health agency.  7. RTC in 4 weeks for MD assessment and labs (CBC with diff, CMP).   Lequita Asal, MD  12/15/2018, 4:47 AM  I saw and evaluated the patient, participating in the key portions of the service and reviewing pertinent diagnostic studies and records.  I reviewed the nurse practitioner's note and agree with the findings and the plan.  The assessment and plan were discussed with the patient.  Additional diagnostic studies of *** are needed to clarify *** and would change the clinical management.  A few ***multiple questions were asked by the patient and answered.   Nolon Stalls, MD 12/15/2018,4:47 AM

## 2018-12-24 ENCOUNTER — Non-Acute Institutional Stay: Payer: Medicare Other | Admitting: Nurse Practitioner

## 2018-12-24 VITALS — BP 124/68 | HR 70 | Temp 97.8°F | Resp 18 | Ht 67.0 in | Wt 202.0 lb

## 2018-12-24 DIAGNOSIS — R630 Anorexia: Secondary | ICD-10-CM

## 2018-12-24 DIAGNOSIS — R2681 Unsteadiness on feet: Secondary | ICD-10-CM | POA: Insufficient documentation

## 2018-12-24 DIAGNOSIS — Z515 Encounter for palliative care: Secondary | ICD-10-CM | POA: Insufficient documentation

## 2018-12-24 NOTE — Progress Notes (Addendum)
[facility] Consult Note Telephone: [phone]  Fax: [phone]  PATIENT NAME: Yolanda Wells DOB: 1932/02/23 MRN: [id number]  PRIMARY CARE PROVIDER:   [name], MD  REFERRING PROVIDER: Dr Yolanda Wells RESPONSIBLE PARTY:  Yolanda Wells [phone]  RECOMMENDATIONS and PLAN:  1. Palliative care encounter Z51.5; Palliative medicine team will continue to support patient, patient's family, and medical team. Visit consisted of counseling and education dealing with the complex and emotionally intense issues of symptom management and palliative care in the setting of serious and potentially life-threatening illness  2. Unsteady gait R26.81 secondary to progression of. Continue fall precautions, high-risk. Encourage therapy for balance training.  3.  Anorexia R63.0 but appetite remaining declined. Continue to encourage supplements and comfort feedings. Discuss nutrition.  ASSESSMENT:    I was asked by Dr Tamala Julian to consult for Yolanda Wells for PC  I visited and observed Yolanda Wells. We talked about purpose of palliative care visit. Yolanda Wells endorses that she wants to go home. We talked about how she was feeling and she shared that she's good enough to go home. We talked about symptoms of pain and shortness of breath but she denies. We talked about her appetite which has been poor. Yolanda Wells and daughter says she does not like the food. We talked about her progress with rehab. She ruminated over going home and that the only reason why she is there is because "insurance just wants the money". We talked about purpose of strengthening and self training. She continued to ruminate over going home. She was cooperative with assessment. She did seem somewhat confused but was oriented to person and place. Events were more difficult for her. Emotional support provided.  I called Yolanda Wells, Yolanda Wells. Talked about  purpose for palliative care visit. Talked about visit palliative care visit with Yolanda Wells. We talked about a last time Yolanda. Wells was independent at home. We talked about past medical history in the city of chronic disease and aging. We talked about multiple hospitalizations including most recent. We talked about being at home and receiving Hospice Services from Yolanda Wells. Yolanda Wells talked about being very frustrated with Hospice Services as she felt like they only wanted to give Yolanda Wells morphine. Yolanda Wells talked about Yolanda Wells wanting to do therapy in to improve with her strengthening. Yolanda. Wells just did not want to take morphine and decline. Yolanda Wells felt like Yolanda Wells could do therapy and she wanted to continue therapy once discharged home. We talked about about her current functional abilities working with therapy. We talked about medical goals of care with aggressive vs. Comfort care. We talked about wishes are to discharge home. Yolanda Wells endorses that she does care for her at home and wishes are to be discharged home with Home Health. Yolanda Wells was open to palliative care to continue to follow. We did talk about Hospice Services overall and that it is a Medicare program. She reiterated that at this time that goals are to make her better and improve her strength with therapy. She wants to continue therapy as long as possible and that since hospice doesn't she wants to pursue a more aggressive path. Yolanda Wells talked about Yolanda Wells primary physician certifying her to have therapy for the last five years until hospice stepped in. She shared that she wants her to continue on therapy as long as she can have it. We talked about role of palliative medicine and plan of care. We talked about  expectations concerning her functional level and therapy. We talked about family dynamics. Therapeutic listening and emotional support provided. She was in agreement to continue to follow a monitor with palliative care with next  visit in two weeks if needed or sooner should she declined. Questions answered the satisfaction. Contact information provided.  1 / 24 / 2,020 sodium 142, potassium 4.6, chloride 107, calcium 8.4, bun 10, creatinine 0.46, glucose 93, albumin 2.8, total protein 6.4, WBC 7.9, hemoglobin 9.0, hematocrit 32.9, platelets 244  I spent 105 minutes providing this consultation,  from 12:45pm to 2:30pm. More than 50% of the time in this consultation was spent coordinating communication.   HISTORY OF PRESENT ILLNESS:  Yolanda Wells is a 85 y.o. year old female with multiple medical problems including Stroke, polycythemic Vera, congestive heart failure, COPD, vascular dementia, collagen vascular disease, deep vein thrombosis right lower extremity with anticoagulation, pulmonary with IVC filter placement, arthritis, dependent edema, diverticulosis, colostomy, hypertension, osteoarthritis, bilateral hip replacements, hysterectomy, glenohumeral arthritis, dependent edema, right toe amputation, right foot amputation, blepharoplasty right upper lead, hammer toes. Hospitalize 10 / 3 / 2019 to 10 / 6 / 2019 for COPD exacerbation secondary to acute bronchitis, morbid obesity with likely sleep apnea. Re-hospitalized 10 / 21 / 2019 to10 / 23 / 2019 for fever with acute cystitis, acute encephalopathy. Work up significant for old stroke but not acute and continue to aspirin with Eliquis. History of DVT with PE and remained on Eliquis. Hospitalized 09/09/2017 to 11 / 3 / 2019 4 GI bleed last colonoscopy ischemic colitis and 2016. Hemoglobin stable with GI console refused any procedures. Most recent CT angio negative for pulmonary embolism. She has followed by oncology outpatient for polycythemia vera and currently taking jakafi. Leukocytosis no evidence of infection though you a pending and history of bladder infections Keflex started prophylactically. Hospitalized 11 / 12 / 2019 to 55 / 16 / 2019 for increased shortness of breath  with productive cough and O2 continuous at home by nasal cannula. She was positive for UTI though CT test negative for PE or infiltrate. She was admitted with initiation of steroids, inhalation therapy and continued Eliquis for anticoagulation. Steroids were discontinued during hospitalization with increasing confusion. Blood cultures grew enterobacter. Delirium improve one steroid therapy was stopped. She did receive IV antibiotic therapy with maxipime. ID was involved in disgust oral Cipro upon discharge. COPD exacerbation improved. Hospitalized 11 / 18 / 2019 to 36 / 21 / 2019 for altered mental status, acute encephalopathy secondary to UTI with reoccurring admissions with gram-negative sepsis with enterbactor. She did so yeast in her urine and symptomatic treated with fluconazole. Anemia of chronic disease with Baseline hemoglobin between 7 and 9 with no actively. COPD stable. She is bed-bound at Baseline and followed by palliative care at home. Hospitalised 12 / 19 / 2019 to 3 / 22 / 2019 for altered mental status secondary to metabolic encephalopathy related to UTI continued on IV ceftriaxone with urine cultures revealing proteus providencia change to cefdinir orally. Shortness of breath suspected to be underlying mild asthma receiving IV steroids switch to oral prednisone though discontinued upon discharge continue duonebs. Remained on Eliquis for recurrent DVT / PE with history of IVC filter placement. Continue jakafi for history polycythemia vera. She returned home. She was re-hospitalized 12 / 30 / 2019 to 1 / 1 / 2020 for shortness of breath and cough with low O2 saturation 86%. She is on chronic 02 and was increased to 3 liters but became anxious. She  was admitted for nebulizer treatments and IV steroids which improved and she was discharged back home. She is a DNR and had Hospice Services at home. She was hospitalized at [facility] from 2 / 2 / 2020 to 2 / 4 / 2020 hypoxia and not compliant with her home  supplemental O2. Her documentation she had been struggling with shortness of breath overhe last year was spirometry 11/2017 revealing severe obstructive and multiple chest x-rays with CT show enlarged pulmonary arteries with elevated r hemidiaphragm. Need for supplemental O2 multifactorial from obesity hypoventilation, hemapheresis, significant obstructive airway disease, possible diaphragm hemapheresis, secondary pulmonary hypertension, chronic diastolic congestive heart failure. History of bilateral lower extremity DVT 05/2015, CVA 03/2015, bilateral PE 05/2015 in the context of polycythemia Vera followed by [facility] oncology last scene 7 / 2019 continued with IVC filter placement on 7/15/ 2019 in transition to Eliquis on 3 / 29 / 2019. She did have a GI Hemorrhage 08/2018 with hemoglobin stable and maintained on apixaban. Aspirin was discontinued as no current indication. She did continue [place] with daily folate supplement and discussion with Oncologist Dr Mike Gip with ongoing normocytic anemia and cell lines consistent with isolated anemia likely secondary to iron deficiency anemia. Palliative care was consulted to further discuss goals of care with focus to decrease recent hospitalizations and family wishes to be discharged to short-term rehab to see if that can improve her functionality and strength the wool considered hospice if she continues to deteriorate. Alert with periods of confusion. Two person assist for transferring and total ADL dependents. She does require tray setup but is able to feed herself. At present she's sitting in the wheelchair in her room. She does appear chronically ill, 02 dependent but comfortable. No visitors present. Palliative Care was asked to help address goals of care.   CODE STATUS: DNR  PPS: 30% HOSPICE ELIGIBILITY/DIAGNOSIS: TBD  PAST MEDICAL HISTORY:  Past Medical History:  Diagnosis Date  . Acute deep vein thrombosis (DVT) of distal vein of left lower extremity  ([place]) 10/03/2015  . Acute pulmonary embolism ([place]) 10/03/2015  . Anticoagulant long-term use   . Arthritis   . Arthritis   . CHF (congestive heart failure) ([place])   . Chronic anticoagulation 10/03/2015  . Collagen vascular disease ([place])   . Colostomy in place [facility]) 10/04/2015  . Deep venous thrombosis ([facility])    right lower extremity  . Dependent edema   . Diverticulosis of intestine with bleeding 04/02/2015  . Glenohumeral arthritis 06/28/2012  . Hammertoe 09/02/2012  . History of bilateral hip replacements 09/02/2012  . History of hysterectomy   . Hypertension   . Hypertension   . Hypokalemia   . Inability to ambulate due to hip 10/12/2015  . Mandibular dysfunction   . Onychomycosis 09/02/2012  . Osteoarthritis   . Polycythemia vera ([place])   . Presence of IVC filter 05/25/2015  . Stroke ([place]) 03/26/2015   cerebellar  . Urinary incontinence in female 09/02/2016    SOCIAL HX:  Social History   Tobacco Use  . Smoking status: Never Smoker  . Smokeless tobacco: Never Used  Substance Use Topics  . Alcohol use: No    ALLERGIES: No Known Allergies   PERTINENT MEDICATIONS:  Outpatient Encounter Medications as of 12/24/2018  Medication Sig  . amLODipine (NORVASC) 5 MG tablet Take 2 tablets (10 mg total) by mouth daily.  . betamethasone valerate ointment (VALISONE) 0.1 % Apply 1 application topically 2 (two) times daily.  . cholecalciferol (VITAMIN D) 1000 units tablet  Take 1,000 Units by mouth daily.  [name] ELIQUIS 5 MG TABS tablet TAKE ONE TABLET BY MOUTH TWICE A DAY (Patient taking differently: Take 5 mg by mouth 2 (two) times daily. )  . estradiol (ESTRACE VAGINAL) 0.1 MG/GM vaginal cream Apply 0.5mg  (pea-sized amount)  just inside the vaginal introitus with a finger-tip on Monday, Wednesday and Friday nights.  . folic acid (FOLVITE) 109 MCG tablet Take 400 mcg by mouth daily.  [name] ipratropium-albuterol (DUONEB) 0.5-2.5 (3) MG/3ML SOLN Take 3 mLs by nebulization every 4 (four) hours  as needed (for shortness of breath/wheezing).  [name] JAKAFI 15 MG tablet TAKE 1 TABLET (15 MG TOTAL) BY MOUTH 2 TIMES DAILY. (Patient taking differently: Take 15 mg by mouth 2 (two) times daily. )  . ondansetron (ZOFRAN ODT) 4 MG disintegrating tablet Take 1 tablet (4 mg total) by mouth every 8 (eight) hours as needed.  . pantoprazole (PROTONIX) 40 MG tablet Take 1 tablet (40 mg total) by mouth daily.  . predniSONE (STERAPRED UNI-PAK 21 TAB) 10 MG (21) TBPK tablet 6 tabs day 1 and taper 10 mg a day - 6 days   No facility-administered encounter medications on file as of 12/24/2018.     PHYSICAL EXAM:   General: NAD, frail appearing, thin Cardiovascular: regular rate and rhythm Pulmonary: clear ant fields Abdomen: soft, nontender, + bowel sounds/Colostomy left lower quadrant GU: no suprapubic tenderness Extremities: no edema, no joint deformities Skin: no rashes Neurological: Weakness but otherwise nonfocal  Oriah Leinweber Ihor Gully, NP

## 2018-12-27 ENCOUNTER — Other Ambulatory Visit: Payer: Self-pay | Admitting: Urgent Care

## 2018-12-27 DIAGNOSIS — D45 Polycythemia vera: Secondary | ICD-10-CM

## 2018-12-27 NOTE — Telephone Encounter (Signed)
Looks like she is at a facility at this point [facility]). PMT is seeing her there. No longer on hospice. She is admitted frequently and has missed several RTC appointments here. Refill, or would we need to see her first?  Yolanda Wells

## 2018-12-29 ENCOUNTER — Telehealth: Payer: Self-pay

## 2018-12-29 NOTE — Telephone Encounter (Signed)
Spoke with Ms Yolanda Wells to see if she could bring Ms Yolanda Wells in for a appointment. She states she is unable to walk at this time she was unable to get her to the office. Ms Yolanda Wells is currently at [facility] healthcare rehab. For therapy and will have her labs drawn at the facility. Ms Yolanda Wells was understanding and agreeable to Ms Yolanda Wells labs done where she is at.

## 2018-12-30 ENCOUNTER — Telehealth: Payer: Self-pay

## 2018-12-30 NOTE — Telephone Encounter (Signed)
Spoke with patient's nurse, Arbie Cookey, to inquire if lab orders have been received. Arbie Cookey confirmed receiving orders and states labs will be drawn tomorrow since tech had difficulty obtaining labs today.

## 2019-01-06 MED FILL — JAKAFI 15 MG TABLET: 15 | 30 days supply | Qty: 60 | Fill #0

## 2019-01-10 ENCOUNTER — Telehealth: Payer: Self-pay

## 2019-01-10 NOTE — Telephone Encounter (Signed)
Faxed lab orders to [facility]/ fax conformation confirmed.

## 2019-01-18 ENCOUNTER — Encounter: Payer: Self-pay | Admitting: Urgent Care

## 2019-01-18 NOTE — Progress Notes (Signed)
  [facility] [address] [place], [place] [address] Phone: [phone] Fax: [phone]   Date: 01/18/19  Name: Yolanda Wells DOB: 1931/12/14 MRN: [id number]  Re: Low B12  B12 level low at 295 pg/mL. Patient previously on parenteral B12 supplementation.  Due to acute illness and multiple hospital admissions, patient has missed several appointments for injections and provider reassessment.    She is currently residing at a SNF [facility]), and granddaughter notes that patient is unable to come in for routine RTC visits.  Will resume B12 injections 1000 mcg IM monthly.  Rx sent to SNF today.  Honor Loh, MSN, APRN, FNP-C, CEN Oncology/Hematology Nurse Practitioner  [place] 01/18/19, 3:01 PM

## 2019-01-24 ENCOUNTER — Telehealth: Payer: Self-pay | Admitting: Pharmacy Technician

## 2019-01-24 NOTE — Telephone Encounter (Signed)
Oral Oncology Patient Advocate Encounter   Was successful in securing patient an $38 grant from Patient [facility]) to provide copayment coverage for Jakafi.  This will keep the out of pocket expense at $0.     [place] spoke with the patients granddaughter, [name].  The billing information is as follows and has been shared with [place].   Member ID: [id number] Group ID: [id number] RxBin: [id number] Dates of Eligibility: 10/26/2018 through 01/23/2020  [place] Patient [place] Phone [phone] Fax [phone] 01/24/2019 3:25 PM

## 2019-01-25 NOTE — Telephone Encounter (Signed)
Left vmail for patients granddaughter, [name].

## 2019-02-02 ENCOUNTER — Encounter: Payer: Self-pay | Admitting: Nurse Practitioner

## 2019-02-02 ENCOUNTER — Non-Acute Institutional Stay: Payer: Medicare Other | Admitting: Nurse Practitioner

## 2019-02-02 ENCOUNTER — Other Ambulatory Visit: Payer: Self-pay

## 2019-02-02 VITALS — BP 136/78 | HR 82 | Temp 97.9°F | Resp 18 | Wt 196.4 lb

## 2019-02-02 DIAGNOSIS — Z515 Encounter for palliative care: Secondary | ICD-10-CM

## 2019-02-02 DIAGNOSIS — R531 Weakness: Secondary | ICD-10-CM

## 2019-02-02 DIAGNOSIS — R630 Anorexia: Secondary | ICD-10-CM | POA: Insufficient documentation

## 2019-02-02 NOTE — Progress Notes (Signed)
[facility] Consult Note Telephone: [phone]  Fax: [phone]  PATIENT NAME: Yolanda Wells DOB: 1932/09/12 MRN: [id number]  PRIMARY CARE PROVIDER:   [name], MD  REFERRING PROVIDER: Dr [facility] RESPONSIBLE PARTY:   [name] granddaughter [phone]  RECOMMENDATIONS and PLAN:  1. Palliative care encounter Z51.5; Palliative medicine team will continue to support patient, patient's family, and medical team. Visit consisted of counseling and education dealing with the complex and emotionally intense issues of symptom management and palliative care in the setting of serious and potentially life-threatening illness  2. Generalized weakness R53.1  secondary to continue with therapy as able. Encourage energy conservation and rest times.  3.  Anorexia R63.0 but appetite remaining declined. Continue to encourage supplements and comfort feedings.   ASSESSMENT:     I visited and observe Yolanda Wells. We talked about purpose of palliative care visit. Asked how she was feeling today and she verbalize that she's very unhappy she's cold. Ask roommate if it's okay to readjust the temperature and in agreement. Yolanda Wells replied no to questions about symptoms of pain and shortness of breath. She verbalize that she will be going home today though discharge day is documented for tomorrow. She was oriented to self. We talked about what she ate today. We talked about purpose for palliative care visit. She continued to ruminate over the temperature in her room. Assured Yolanda Wells that readjusted temperature will warm up soon. It was difficult to keep Yolanda Wells on discussion questions. She was cooperative with assessment. Limited verbal discussion with cognitive impairment and ruminating over the room temperature. She does appear to be chronically ill though stable at present time. Therapeutic listening and emotional support  provided. I updated nursing be back. I will call her granddaughter Wilnette Kales for further discussion of palliative care and if wishes to continue in home once discharged.  3 / 20 /2020 WBC 4.9, hemoglobin 9.4, hematocrit 29.8, platelets 222  I spent 45 minutes providing this consultation,  from 10:30am to 11:15am. More than 50% of the time in this consultation was spent coordinating communication.   HISTORY OF PRESENT ILLNESS:  Yolanda Wells is a 85 y.o. year old female with multiple medical problems includingStroke, polycythemic Vera, congestive heart failure, COPD, vascular dementia, collagen vascular disease, deep vein thrombosis right lower extremity with anticoagulation, pulmonary with IVC filter placement, arthritis, dependent edema, diverticulosis, colostomy, hypertension, osteoarthritis, bilateral hip replacements, hysterectomy, glenohumeral arthritis, dependent edema, right toe amputation, right foot amputation, blepharoplasty right upper lead, hammer toes. Yolanda Wells continues to reside in skilled [facility]. She does require assistance for transfers, mobility, adl's. She does feed herself an appetite remains fair to poor. Continues to reside at [facility]. Her discharge plan she will be discharged on 02/03/2019 at 11 a.m. with [place]. No recent falls. She does have times where she's more confused than other times. She is oriented to self and able to make her needs known. At present she is sitting in the wheelchair in her room, on continuous oxygen. She's very upset with her roommate concerning the temperature in the room as the air conditioner continues to work. She appears chronically ill, debilitated and oriented to self. No visitors present. Palliative Care was asked to help address goals of care.   CODE STATUS: DNR  PPS: 40% HOSPICE ELIGIBILITY/DIAGNOSIS: TBD  PAST MEDICAL HISTORY:  Past Medical History:  Diagnosis Date  Acute deep vein thrombosis (DVT) of distal vein of left lower extremity ([place]) 10/03/2015   Acute pulmonary embolism ([place]) 10/03/2015   Anticoagulant long-term use    Arthritis    Arthritis    CHF (congestive heart failure) ([place])    Chronic anticoagulation 10/03/2015   Collagen vascular disease ([place])    Colostomy in place ([place]) 10/04/2015   Deep venous thrombosis ([place])    right lower extremity   Dependent edema    Diverticulosis of intestine with bleeding 04/02/2015   Glenohumeral arthritis 06/28/2012   Hammertoe 09/02/2012   History of bilateral hip replacements 09/02/2012   History of hysterectomy    Hypertension    Hypertension    Hypokalemia    Inability to ambulate due to hip 10/12/2015   Mandibular dysfunction    Onychomycosis 09/02/2012   Osteoarthritis    Polycythemia vera ([place])    Presence of IVC filter 05/25/2015   Stroke ([place]) 03/26/2015   cerebellar   Urinary incontinence in female 09/02/2016    SOCIAL HX:  Social History   Tobacco Use   Smoking status: Never Smoker   Smokeless tobacco: Never Used  Substance Use Topics   Alcohol use: No    ALLERGIES: No Known Allergies   PERTINENT MEDICATIONS:  Outpatient Encounter Medications as of 02/02/2019  Medication Sig   amLODipine (NORVASC) 5 MG tablet Take 2 tablets (10 mg total) by mouth daily.   betamethasone valerate ointment (VALISONE) 0.1 % Apply 1 application topically 2 (two) times daily.   cholecalciferol (VITAMIN D) 1000 units tablet Take 1,000 Units by mouth daily.   ELIQUIS 5 MG TABS tablet TAKE ONE TABLET BY MOUTH TWICE A DAY (Patient taking differently: Take 5 mg by mouth 2 (two) times daily. )   estradiol (ESTRACE VAGINAL) 0.1 MG/GM vaginal cream Apply 0.5mg  (pea-sized amount)  just inside the vaginal introitus with a finger-tip on Monday, Wednesday and Friday nights.   folic acid (FOLVITE) 161 MCG tablet Take 400 mcg by mouth daily.   ipratropium-albuterol  (DUONEB) 0.5-2.5 (3) MG/3ML SOLN Take 3 mLs by nebulization every 4 (four) hours as needed (for shortness of breath/wheezing).   JAKAFI 15 MG tablet TAKE 1 TABLET (15 MG TOTAL) BY MOUTH 2 TIMES DAILY.   ondansetron (ZOFRAN ODT) 4 MG disintegrating tablet Take 1 tablet (4 mg total) by mouth every 8 (eight) hours as needed.   pantoprazole (PROTONIX) 40 MG tablet Take 1 tablet (40 mg total) by mouth daily.   predniSONE (STERAPRED UNI-PAK 21 TAB) 10 MG (21) TBPK tablet 6 tabs day 1 and taper 10 mg a day - 6 days   No facility-administered encounter medications on file as of 02/02/2019.     PHYSICAL EXAM:   General: obese, chronically ill, female Cardiovascular: regular rate and rhythm Pulmonary: clear ant fields Abdomen: soft, nontender, + bowel sounds GU: no suprapubic tenderness Extremities: no edema, no joint deformities Skin: no rashes Neurological: Weakness but otherwise nonfocal  Meryl Hubers Ihor Gully, NP

## 2019-02-04 ENCOUNTER — Telehealth: Payer: Self-pay | Admitting: Nurse Practitioner

## 2019-02-04 NOTE — Telephone Encounter (Signed)
[name], Ms Micciche's granddaughter return my call. She talked about bringing her home tomorrow and concerned that she doesn't have any Oxygen. We talked about notifying [facility] social worker and have that in place prior to her being discharged home. Ms Nyoka Cowden indoor says she was hoping to be able to keep Ms Treu at [facility] care for another few weeks in light of the covid-19 pandemic crisis asthma screen does work for a hospital. She is concerned about her exposure to her grandmother. We talked about discharge resources including Home Health which is what they will be receiving. We talked about role of palliative care and plan of care. Ms Nyoka Cowden endorses she would like to continue palliative care at home. We talked about option of Hospice Services. Ms Nyoka Cowden endorses at this point in time she prefers not to have hospice as Ms Biswell was previously under [employer] and she continues to get bills. She wanted a break from hospice. We talked about resources available. She verbalize understanding and shared maybe after home health is completed. Therapeutic listening and emotional support provided. Contact information provided. Obtained primary provider and will request a palliative care in-home to continue to follow. Questions answered satisfaction  Total time spent 30 minutes  Documentation 10 minutes  Phone discussion 20 minutes

## 2019-02-07 MED FILL — JAKAFI 15 MG TABLET: 15 | 30 days supply | Qty: 60 | Fill #1

## 2019-02-18 ENCOUNTER — Other Ambulatory Visit: Payer: Self-pay

## 2019-02-18 ENCOUNTER — Emergency Department: Payer: Medicare Other

## 2019-02-18 ENCOUNTER — Inpatient Hospital Stay
Admission: EM | Admit: 2019-02-18 | Discharge: 2019-02-20 | DRG: 191 | Disposition: A | Discharge status: Home / Self Care | Payer: Medicare Other | Attending: Internal Medicine | Admitting: Internal Medicine

## 2019-02-18 DIAGNOSIS — Z8249 Family history of ischemic heart disease and other diseases of the circulatory system: Secondary | ICD-10-CM | POA: Diagnosis not present

## 2019-02-18 DIAGNOSIS — Z8261 Family history of arthritis: Secondary | ICD-10-CM

## 2019-02-18 DIAGNOSIS — Z96643 Presence of artificial hip joint, bilateral: Secondary | ICD-10-CM | POA: Diagnosis present

## 2019-02-18 DIAGNOSIS — Z825 Family history of asthma and other chronic lower respiratory diseases: Secondary | ICD-10-CM

## 2019-02-18 DIAGNOSIS — R0602 Shortness of breath: Secondary | ICD-10-CM

## 2019-02-18 DIAGNOSIS — D45 Polycythemia vera: Secondary | ICD-10-CM | POA: Diagnosis present

## 2019-02-18 DIAGNOSIS — J9611 Chronic respiratory failure with hypoxia: Secondary | ICD-10-CM | POA: Diagnosis present

## 2019-02-18 DIAGNOSIS — Z808 Family history of malignant neoplasm of other organs or systems: Secondary | ICD-10-CM | POA: Diagnosis not present

## 2019-02-18 DIAGNOSIS — J441 Chronic obstructive pulmonary disease with (acute) exacerbation: Principal | ICD-10-CM | POA: Diagnosis present

## 2019-02-18 DIAGNOSIS — Z8673 Personal history of transient ischemic attack (TIA), and cerebral infarction without residual deficits: Secondary | ICD-10-CM | POA: Diagnosis not present

## 2019-02-18 DIAGNOSIS — M19019 Primary osteoarthritis, unspecified shoulder: Secondary | ICD-10-CM | POA: Diagnosis present

## 2019-02-18 DIAGNOSIS — G4733 Obstructive sleep apnea (adult) (pediatric): Secondary | ICD-10-CM | POA: Diagnosis present

## 2019-02-18 DIAGNOSIS — K5790 Diverticulosis of intestine, part unspecified, without perforation or abscess without bleeding: Secondary | ICD-10-CM | POA: Diagnosis present

## 2019-02-18 DIAGNOSIS — I5042 Chronic combined systolic (congestive) and diastolic (congestive) heart failure: Secondary | ICD-10-CM | POA: Diagnosis present

## 2019-02-18 DIAGNOSIS — Z86718 Personal history of other venous thrombosis and embolism: Secondary | ICD-10-CM | POA: Diagnosis not present

## 2019-02-18 DIAGNOSIS — Z86711 Personal history of pulmonary embolism: Secondary | ICD-10-CM | POA: Diagnosis not present

## 2019-02-18 DIAGNOSIS — I4510 Unspecified right bundle-branch block: Secondary | ICD-10-CM | POA: Diagnosis present

## 2019-02-18 DIAGNOSIS — Z933 Colostomy status: Secondary | ICD-10-CM | POA: Diagnosis not present

## 2019-02-18 DIAGNOSIS — I110 Hypertensive heart disease with heart failure: Secondary | ICD-10-CM | POA: Diagnosis present

## 2019-02-18 DIAGNOSIS — Z7901 Long term (current) use of anticoagulants: Secondary | ICD-10-CM

## 2019-02-18 DIAGNOSIS — Z9981 Dependence on supplemental oxygen: Secondary | ICD-10-CM | POA: Diagnosis not present

## 2019-02-18 DIAGNOSIS — I471 Supraventricular tachycardia: Secondary | ICD-10-CM | POA: Diagnosis not present

## 2019-02-18 DIAGNOSIS — Z833 Family history of diabetes mellitus: Secondary | ICD-10-CM | POA: Diagnosis not present

## 2019-02-18 LAB — CBC WITH DIFFERENTIAL/PLATELET
Abs Immature Granulocytes: 0.32 10*3/uL — ABNORMAL HIGH (ref 0.00–0.07)
Basophils Absolute: 0 10*3/uL (ref 0.0–0.1)
Basophils Relative: 0 %
Eosinophils Absolute: 0.1 10*3/uL (ref 0.0–0.5)
Eosinophils Relative: 1 %
HCT: 30.9 % — ABNORMAL LOW (ref 36.0–46.0)
Hemoglobin: 8.6 g/dL — ABNORMAL LOW (ref 12.0–15.0)
Immature Granulocytes: 6 %
Lymphocytes Relative: 20 %
Lymphs Abs: 1 10*3/uL (ref 0.7–4.0)
MCH: 23.9 pg — ABNORMAL LOW (ref 26.0–34.0)
MCHC: 27.8 g/dL — ABNORMAL LOW (ref 30.0–36.0)
MCV: 85.8 fL (ref 80.0–100.0)
Monocytes Absolute: 0.3 10*3/uL (ref 0.1–1.0)
Monocytes Relative: 5 %
Neutro Abs: 3.5 10*3/uL (ref 1.7–7.7)
Neutrophils Relative %: 68 %
Platelets: 206 10*3/uL (ref 150–400)
RBC: 3.6 MIL/uL — ABNORMAL LOW (ref 3.87–5.11)
RDW: 28.6 % — ABNORMAL HIGH (ref 11.5–15.5)
WBC: 5.2 10*3/uL (ref 4.0–10.5)
nRBC: 2.1 % — ABNORMAL HIGH (ref 0.0–0.2)

## 2019-02-18 LAB — COMPREHENSIVE METABOLIC PANEL
ALT: 7 U/L (ref 0–44)
AST: 18 U/L (ref 15–41)
Albumin: 3.1 g/dL — ABNORMAL LOW (ref 3.5–5.0)
Alkaline Phosphatase: 70 U/L (ref 38–126)
Anion gap: 4 — ABNORMAL LOW (ref 5–15)
BUN: 11 mg/dL (ref 8–23)
CO2: 35 mmol/L — ABNORMAL HIGH (ref 22–32)
Calcium: 8.5 mg/dL — ABNORMAL LOW (ref 8.9–10.3)
Chloride: 104 mmol/L (ref 98–111)
Creatinine, Ser: 0.35 mg/dL — ABNORMAL LOW (ref 0.44–1.00)
GFR calc Af Amer: >60 mL/min (ref >60)
GFR calc non Af Amer: >60 mL/min (ref >60)
Glucose, Bld: 95 mg/dL (ref 70–99)
Potassium: 4.1 mmol/L (ref 3.5–5.1)
Sodium: 143 mmol/L (ref 135–145)
Total Bilirubin: 0.8 mg/dL (ref 0.3–1.2)
Total Protein: 7 g/dL (ref 6.5–8.1)

## 2019-02-18 LAB — RESPIRATORY PANEL BY PCR

## 2019-02-18 LAB — URINALYSIS, ROUTINE W REFLEX MICROSCOPIC
Bilirubin Urine: NEGATIVE
Glucose, UA: NEGATIVE mg/dL
Ketones, ur: NEGATIVE mg/dL
Nitrite: POSITIVE — AB
Protein, ur: NEGATIVE mg/dL
Specific Gravity, Urine: 1.012 (ref 1.005–1.030)
pH: 7 (ref 5.0–8.0)

## 2019-02-18 LAB — INFLUENZA PANEL BY PCR (TYPE A & B)
Influenza A By PCR: NEGATIVE
Influenza B By PCR: NEGATIVE

## 2019-02-18 LAB — BRAIN NATRIURETIC PEPTIDE: B Natriuretic Peptide: 89 pg/mL (ref 0.0–100.0)

## 2019-02-18 LAB — TROPONIN I: Troponin I: <0.03 ng/mL (ref <0.03)

## 2019-02-18 MED ORDER — VITAMIN D 25 MCG (1000 UNIT) PO TABS
1000.0000 [IU] | ORAL_TABLET | Freq: Every day | ORAL | Status: DC
  Administered 2019-02-18 – 2019-02-20 (×3): 1000 [IU] via ORAL
  Filled 2019-02-18 (×3): qty 1

## 2019-02-18 MED ORDER — METHYLPREDNISOLONE SODIUM SUCC 40 MG IJ SOLR
40.0000 mg | Freq: Three times a day (TID) | INTRAMUSCULAR | Status: DC
  Administered 2019-02-18 – 2019-02-20 (×5): 40 mg via INTRAVENOUS
  Filled 2019-02-18 (×6): qty 1

## 2019-02-18 MED ORDER — ONDANSETRON 4 MG PO TBDP: 4.0000 mg | ORAL_TABLET | Freq: Three times a day (TID) | ORAL | Status: DC | PRN

## 2019-02-18 MED ORDER — GABAPENTIN 300 MG PO CAPS
300.0000 mg | ORAL_CAPSULE | Freq: Every day | ORAL | Status: DC
  Administered 2019-02-18 – 2019-02-19 (×2): 300 mg via ORAL
  Filled 2019-02-18 (×2): qty 1

## 2019-02-18 MED ORDER — ASPIRIN EC 81 MG PO TBEC
81.0000 mg | DELAYED_RELEASE_TABLET | Freq: Every day | ORAL | Status: DC
  Administered 2019-02-18 – 2019-02-20 (×3): 81 mg via ORAL
  Filled 2019-02-18 (×3): qty 1

## 2019-02-18 MED ORDER — IPRATROPIUM-ALBUTEROL 0.5-2.5 (3) MG/3ML IN SOLN
3.0000 mL | RESPIRATORY_TRACT | Status: DC | PRN
  Administered 2019-02-20: 09:00:00 3 mL via RESPIRATORY_TRACT

## 2019-02-18 MED ORDER — POTASSIUM CHLORIDE CRYS ER 20 MEQ PO TBCR: 20.0000 meq | EXTENDED_RELEASE_TABLET | ORAL | Status: DC | PRN

## 2019-02-18 MED ORDER — DOCUSATE SODIUM 100 MG PO CAPS
100.0000 mg | ORAL_CAPSULE | Freq: Two times a day (BID) | ORAL | Status: DC
  Administered 2019-02-18 – 2019-02-20 (×4): 100 mg via ORAL
  Filled 2019-02-18 (×4): qty 1

## 2019-02-18 MED ORDER — AMLODIPINE BESYLATE 10 MG PO TABS
10.0000 mg | ORAL_TABLET | Freq: Every day | ORAL | Status: DC
  Administered 2019-02-18 – 2019-02-20 (×3): 10 mg via ORAL
  Filled 2019-02-18 (×3): qty 1

## 2019-02-18 MED ORDER — APIXABAN 5 MG PO TABS
5.0000 mg | ORAL_TABLET | Freq: Two times a day (BID) | ORAL | Status: DC
  Administered 2019-02-18 – 2019-02-20 (×4): 5 mg via ORAL
  Filled 2019-02-18 (×4): qty 1

## 2019-02-18 MED ORDER — AZITHROMYCIN 250 MG PO TABS
500.0000 mg | ORAL_TABLET | Freq: Every day | ORAL | Status: DC
  Administered 2019-02-18 – 2019-02-20 (×3): 500 mg via ORAL
  Filled 2019-02-18 (×3): qty 2

## 2019-02-18 MED ORDER — METHYLPREDNISOLONE SODIUM SUCC 125 MG IJ SOLR
125.0000 mg | Freq: Once | INTRAMUSCULAR | Status: AC
  Administered 2019-02-18: 125 mg via INTRAVENOUS
  Filled 2019-02-18: qty 2

## 2019-02-18 MED ORDER — FOLIC ACID 1 MG PO TABS
500.0000 ug | ORAL_TABLET | Freq: Every day | ORAL | Status: DC
  Administered 2019-02-18 – 2019-02-20 (×3): 0.5 mg via ORAL
  Filled 2019-02-18 (×3): qty 1

## 2019-02-18 MED ORDER — IPRATROPIUM-ALBUTEROL 0.5-2.5 (3) MG/3ML IN SOLN
3.0000 mL | RESPIRATORY_TRACT | Status: DC
  Administered 2019-02-18 – 2019-02-19 (×4): 3 mL via RESPIRATORY_TRACT
  Filled 2019-02-18 (×5): qty 3

## 2019-02-18 MED ORDER — PANTOPRAZOLE SODIUM 40 MG PO TBEC
40.0000 mg | DELAYED_RELEASE_TABLET | Freq: Every day | ORAL | Status: DC
  Administered 2019-02-18 – 2019-02-20 (×3): 40 mg via ORAL
  Filled 2019-02-18 (×3): qty 1

## 2019-02-18 MED ORDER — POLYETHYLENE GLYCOL 3350 17 G PO PACK
17.0000 g | PACK | ORAL | Status: DC
  Administered 2019-02-19 – 2019-02-20 (×2): 17 g via ORAL
  Filled 2019-02-18: qty 1

## 2019-02-18 MED ORDER — ALBUTEROL SULFATE HFA 108 (90 BASE) MCG/ACT IN AERS
2.0000 | INHALATION_SPRAY | Freq: Once | RESPIRATORY_TRACT | Status: AC
  Administered 2019-02-18: 13:00:00 2 via RESPIRATORY_TRACT
  Filled 2019-02-18: qty 6.7

## 2019-02-18 NOTE — ED Provider Notes (Addendum)
[facility] Emergency Department Provider Note  ____________________________________________   I have reviewed the triage vital signs and the nursing notes. Where available I have reviewed prior notes and, if possible and indicated, outside hospital notes.    HISTORY  Chief Complaint Shortness of Breath    HPI Yolanda Wells is a 85 y.o. female  Has an extensive history of multiple medical problems including COPD on 2 L of oxygen CHF reported although with normal systolic function on echo performed October of year, history of PE on Eliquis, and multiple other medical problems presents today feeling short of breath last 2 days.  No fever, occasional dry cough.  Has had no known exposures to coronavirus.  Is usually in her house.  Have had to go up on her oxygen.  This feels like her normal COPD flare.  Nuys pleuritic chest pain or any other kind of chest pain, denies fever.  No productive cough.     Past Medical History:  Diagnosis Date  . Acute deep vein thrombosis (DVT) of distal vein of left lower extremity ([place]) 10/03/2015  . Acute pulmonary embolism ([place]) 10/03/2015  . Anticoagulant long-term use   . Arthritis   . Arthritis   . CHF (congestive heart failure) ([place])   . Chronic anticoagulation 10/03/2015  . Collagen vascular disease ([place])   . Colostomy in place [facility]) 10/04/2015  . Deep venous thrombosis ([facility])    right lower extremity  . Dependent edema   . Diverticulosis of intestine with bleeding 04/02/2015  . Glenohumeral arthritis 06/28/2012  . Hammertoe 09/02/2012  . History of bilateral hip replacements 09/02/2012  . History of hysterectomy   . Hypertension   . Hypertension   . Hypokalemia   . Inability to ambulate due to hip 10/12/2015  . Mandibular dysfunction   . Onychomycosis 09/02/2012  . Osteoarthritis   . Polycythemia vera ([place])   . Presence of IVC filter 05/25/2015  . Stroke ([place]) 03/26/2015   cerebellar  . Urinary incontinence  in female 09/02/2016    Patient Active Problem List   Diagnosis Date Noted  . Decreased appetite 02/02/2019  . Poor appetite 12/24/2018  . Unsteady 12/24/2018  . Palliative care encounter 12/24/2018  . SOB (shortness of breath) 10/28/2018  . Shortness of breath 09/27/2018  . COPD exacerbation ([place]) 09/22/2018  . Sepsis ([place]) 09/22/2018  . Lower GI bleed 09/09/2018  . AMS (altered mental status) 08/30/2018  . Pressure injury of skin 08/13/2018  . B12 deficiency 08/04/2018  . Anemia 06/07/2018  . Secondary myelofibrosis ([place]) 02/17/2018  . Goals of care, counseling/discussion 12/23/2017  . Palliative care by specialist   . DNR (do not resuscitate) discussion   . Weakness generalized   . Chronic diastolic CHF (congestive heart failure) ([place]) 07/28/2017  . Vascular dementia without behavioral disturbance ([place]) 07/08/2017  . Influenza with respiratory manifestation 12/01/2016  . Respiratory distress 11/23/2016  . Urinary incontinence in female 09/02/2016  . Vision loss of right eye 08/26/2016  . Blindness of right eye 08/26/2016  . Inability to ambulate due to hip 10/12/2015  . Colostomy in place [facility]) 10/04/2015  . Long term current use of anticoagulant therapy 10/03/2015  . Leukocytosis 09/25/2015  . CAP (community acquired pneumonia) 09/24/2015  . COPD (chronic obstructive pulmonary disease) ([place]) 09/24/2015  . UTI (urinary tract infection) 07/16/2015  . Abdominal wall cellulitis 07/15/2015  . DVT (deep venous thrombosis) ([place]) 07/15/2015  . HTN (hypertension) 07/15/2015  . Polycythemia vera ([place]) 07/15/2015  . Arthritis 07/15/2015  .  Pulmonary emboli ([place]) 05/25/2015  . Diverticulosis of colon with hemorrhage 04/02/2015  . Diverticulosis of intestine with bleeding 04/02/2015  . Fall 01/10/2015  . Osteoarthritis 01/10/2015  . Hammertoe 09/02/2012  . S/P transmetatarsal amputation of foot ([place]) 09/02/2012  . Onychomycosis 09/02/2012  . Other specified dermatoses  09/02/2012  . S/P hip replacement 08/23/2012  . Glenohumeral arthritis 06/28/2012    Past Surgical History:  Procedure Laterality Date  . ABDOMINAL HYSTERECTOMY    . BLEPHAROPLASTY Right 03/2017   right upper eyelid  . CARDIAC CATHETERIZATION Right 04/03/2015   Procedure: CENTRAL LINE INSERTION;  Surgeon: Sherri Rad, MD;  Location: [facility] ORS;  Service: General;  Laterality: Right;  . COLECTOMY WITH COLOSTOMY CREATION/HARTMANN PROCEDURE N/A 04/03/2015   Procedure: COLECTOMY WITH COLOSTOMY CREATION/HARTMANN PROCEDURE;  Surgeon: Sherri Rad, MD;  Location: [facility] ORS;  Service: General;  Laterality: N/A;  . COLON SURGERY    . FOOT AMPUTATION     partial  . FOOT AMPUTATION Right   . IVC FILTER INSERTION    . JOINT REPLACEMENT     Left and Right Hip  . PERIPHERAL VASCULAR CATHETERIZATION N/A 04/02/2015   Procedure: Visceral Angiography;  Surgeon: Algernon Huxley, MD;  Location: [place] [facility];  Service: Cardiovascular;  Laterality: N/A;  . PERIPHERAL VASCULAR CATHETERIZATION N/A 04/02/2015   Procedure: Visceral Artery Intervention;  Surgeon: Algernon Huxley, MD;  Location: [place] [facility];  Service: Cardiovascular;  Laterality: N/A;  . PERIPHERAL VASCULAR CATHETERIZATION N/A 05/25/2015   Procedure: IVC Filter Insertion;  Surgeon: Katha Cabal, MD;  Location: [place] [facility];  Service: Cardiovascular;  Laterality: N/A;  . TOE AMPUTATION     right  . TOTAL HIP ARTHROPLASTY      Prior to Admission medications   Medication Sig Start Date End Date Taking? Authorizing Provider  amLODipine (NORVASC) 5 MG tablet Take 2 tablets (10 mg total) by mouth daily. 10/31/18   Fritzi Mandes, MD  betamethasone valerate ointment (VALISONE) 0.1 % Apply 1 application topically 2 (two) times daily. 11/07/18 11/07/19  [provider]  cholecalciferol (VITAMIN D) 1000 units tablet Take 1,000 Units by mouth daily.    [provider]  ELIQUIS 5 MG TABS tablet TAKE ONE TABLET BY MOUTH TWICE A  DAY Patient taking differently: Take 5 mg by mouth 2 (two) times daily.  11/04/18   Karen Kitchens, NP  estradiol (ESTRACE VAGINAL) 0.1 MG/GM vaginal cream Apply 0.5mg  (pea-sized amount)  just inside the vaginal introitus with a finger-tip on Monday, Wednesday and Friday nights. 08/09/18   Zara Council A, PA-C  folic acid (FOLVITE) 539 MCG tablet Take 400 mcg by mouth daily.    [provider]  ipratropium-albuterol (DUONEB) 0.5-2.5 (3) MG/3ML SOLN Take 3 mLs by nebulization every 4 (four) hours as needed (for shortness of breath/wheezing). 11/10/18   Sudini, Srikar, MD  JAKAFI 15 MG tablet TAKE 1 TABLET (15 MG TOTAL) BY MOUTH 2 TIMES DAILY. 12/27/18   Lequita Asal, MD  ondansetron (ZOFRAN ODT) 4 MG disintegrating tablet Take 1 tablet (4 mg total) by mouth every 8 (eight) hours as needed. 11/26/18   Rudene Re, MD  pantoprazole (PROTONIX) 40 MG tablet Take 1 tablet (40 mg total) by mouth daily. 09/12/18   [name], MD  predniSONE (STERAPRED UNI-PAK 21 TAB) 10 MG (21) TBPK tablet 6 tabs day 1 and taper 10 mg a day - 6 days Patient not taking: Reported on 02/18/2019 11/10/18   Hillary Bow, MD  Allergies Patient has no known allergies.  Family History  Problem Relation Age of Onset  . Brain cancer Mother   . Heart attack Father   . COPD Father   . Coronary artery disease Father   . Hypertension Other   . Arthritis-Osteo Other   . Diabetes Brother   . Arthritis Brother   . Osteosarcoma Son   . Bone cancer Son   . Arthritis Sister   . Arthritis Brother   . Hypertension Brother   . Coronary artery disease Brother   . Malignant hyperthermia Brother     Social History Social History   Tobacco Use  . Smoking status: Never Smoker  . Smokeless tobacco: Never Used  Substance Use Topics  . Alcohol use: No  . Drug use: No    Review of Systems Constitutional: No fever/chills Eyes: No visual changes. ENT: No sore throat. No stiff neck no neck  pain Cardiovascular: Denies chest pain. Respiratory: + shortness of breath. Gastrointestinal:   no vomiting.  No diarrhea.  No constipation. Genitourinary: Negative for dysuria. Musculoskeletal: Negative lower extremity swelling Skin: Negative for rash. Neurological: Negative for severe headaches, focal weakness or numbness.   ____________________________________________   PHYSICAL EXAM:  VITAL SIGNS: ED Triage Vitals  Enc Vitals Group     BP 02/18/19 1129 (!) 185/85     Pulse Rate 02/18/19 1129 83     Resp 02/18/19 1129 (!) 30     Temp 02/18/19 1129 98.3 F (36.8 C)     Temp Source 02/18/19 1129 Oral     SpO2 02/18/19 1129 (!) 88 %     Weight 02/18/19 1130 196 lb 6.4 oz (89.1 kg)     Height 02/18/19 1130 5\' 8"  (1.727 m)     Head Circumference --      Peak Flow --      Pain Score 02/18/19 1130 0     Pain Loc --      Pain Edu? --      Excl. in [place]? --     Constitutional: Alert and oriented. Well appearing and in no acute distress. Eyes: Conjunctivae are normal Head: Atraumatic HEENT: No congestion/rhinnorhea. Mucous membranes are moist.  Oropharynx non-erythematous Neck:   Nontender with no meningismus, no masses, no stridor Cardiovascular: Normal rate, regular rhythm. Grossly normal heart sounds.  Good peripheral circulation. Respiratory: Normal respiratory effort.  No retractions.  Lungs diminished in the bases with occasional slight wheeze Abdominal: Soft and nontender. No distention. No guarding no rebound Back:  There is no focal tenderness or step off.  there is no midline tenderness there are no lesions noted. there is no CVA tenderness Musculoskeletal: No lower extremity tenderness, no upper extremity tenderness. No joint effusions, no DVT signs strong distal pulses no edema Neurologic:  Normal speech and language. No gross focal neurologic deficits are appreciated.  Skin:  Skin is warm, dry and intact. No rash noted. Psychiatric: Mood and affect are normal.  Speech and behavior are normal.  ____________________________________________   LABS (all labs ordered are listed, but only abnormal results are displayed)  Labs Reviewed  CBC WITH DIFFERENTIAL/PLATELET  COMPREHENSIVE METABOLIC PANEL  BRAIN NATRIURETIC PEPTIDE  TROPONIN I    Pertinent labs  results that were available during my care of the patient were reviewed by me and considered in my medical decision making (see chart for details). ____________________________________________  EKG  I personally interpreted any EKGs ordered by me or triage Please sinus rhythm, rate 77 bpm, right bundle  branch block noted, PACs noted, ST changes are consistent with prior. ____________________________________________  RADIOLOGY  Pertinent labs & imaging results that were available during my care of the patient were reviewed by me and considered in my medical decision making (see chart for details). If possible, patient and/or family made aware of any abnormal findings.  No results found. ____________________________________________    PROCEDURES  Procedure(s) performed: None  Procedures  Critical Care performed: None  ____________________________________________   INITIAL IMPRESSION / ASSESSMENT AND PLAN / ED COURSE  Pertinent labs & imaging results that were available during my care of the patient were reviewed by me and considered in my medical decision making (see chart for details).  Chronically ill patient presents today with increased oxygen demand most likely COPD.  No fever to suggest coronavirus obviously that is a nonspecific finding unfortunately.  We have maintained her in droplet precautions.  We will avoid nebulizer per protocol.  We will give her Solu-Medrol, low suspicion for PE although certainly could be possible there are multiple different reasons for this woman to be possibly short of breath.  We are giving her a chest x-ray, basic blood work and we will  reassess.  ----------------------------------------- 1:37 PM on 02/18/2019 -----------------------------------------  Patient is feeling somewhat better after interventions here, sats were reading 80% in the chart but when I went in the room her waveform was not picking up.  After we corrected and she is up to 100 at this time.  However, given her hypoxia and tachypnea on initial presentation and her multiple comorbidities I feel that an observational stay i COPD is warranted.  Likely they can get her home tomorrow.  We will admit her hospitalist agrees with management and protocol.    ____________________________________________   FINAL CLINICAL IMPRESSION(S) / ED DIAGNOSES  Final diagnoses:  SOB (shortness of breath)      This chart was dictated using voice recognition software.  Despite best efforts to proofread,  errors can occur which can change meaning.      Schuyler Amor, MD 02/18/19 1220    Schuyler Amor, MD 02/18/19 1341

## 2019-02-18 NOTE — ED Notes (Signed)
Patient transported to CT 

## 2019-02-18 NOTE — ED Triage Notes (Addendum)
Pt arrived via [facility] from home with SOB . Pt has hx of COPD and has been not eating as much d/t not being able to catch her breath while eating. Pt lives with granddaughter. Pt normally wears 2L at home but has been increased to 3L to maintain oxygen sats. Also periods of bradycardia.

## 2019-02-18 NOTE — Progress Notes (Signed)
Colostomy intact on admission. Left mid abd. Brown liquid stool in bag.

## 2019-02-18 NOTE — H&P (Addendum)
[facility] NAME: Yolanda Wells    MR#:  [id number]  DATE OF BIRTH:  1932-10-29  DATE OF ADMISSION:  02/18/2019  PRIMARY CARE PHYSICIAN: [name], MD   REQUESTING/REFERRING PHYSICIAN: Charlotte Crumb  CHIEF COMPLAINT:   Chief Complaint  Patient presents with  . Shortness of Breath    HISTORY OF PRESENT ILLNESS:  Yolanda Wells  is a 85 y.o. female with a known history of polycythemia vera for which patient follows up with [facility], pulmonary embolism and DVT on chronic anticoagulation with Eliquis, chronic hypoxic respiratory failure on 2 L of oxygen at baseline ,chronic diastolic CHF and hypertension was brought into the emergency room today with complaints of shortness of breath and some wheezing.  Occasional nonproductive cough.  No fevers.  No sick contacts.  No recent travel history.  No nausea vomiting or diarrhea.  No abdominal pains. Patient usually uses 2 L of oxygen.  Was noted to be hypoxic with O2 sat of 88% on 2 L.  Was placed on 3 L with improvement in oxygen saturation to 98%. Patient was evaluated in the emergency room and laboratory studies done unremarkable.  Chest x-ray done revealed mild vascular congestion without edema.  BNP unremarkable.  Rapid flu test done respiratory virus panel already requested.  Stat CT chest requested for further evaluation.  PAST MEDICAL HISTORY:   Past Medical History:  Diagnosis Date  . Acute deep vein thrombosis (DVT) of distal vein of left lower extremity ([place]) 10/03/2015  . Acute pulmonary embolism ([place]) 10/03/2015  . Anticoagulant long-term use   . Arthritis   . Arthritis   . CHF (congestive heart failure) ([place])   . Chronic anticoagulation 10/03/2015  . Collagen vascular disease ([employer])   . Colostomy in place [facility]) 10/04/2015  . Deep venous thrombosis (HCC)    right lower extremity  . Dependent edema   . Diverticulosis of intestine with bleeding 04/02/2015  .  Glenohumeral arthritis 06/28/2012  . Hammertoe 09/02/2012  . History of bilateral hip replacements 09/02/2012  . History of hysterectomy   . Hypertension   . Hypertension   . Hypokalemia   . Inability to ambulate due to hip 10/12/2015  . Mandibular dysfunction   . Onychomycosis 09/02/2012  . Osteoarthritis   . Polycythemia vera ([place])   . Presence of IVC filter 05/25/2015  . Stroke ([place]) 03/26/2015   cerebellar  . Urinary incontinence in female 09/02/2016    PAST SURGICAL HISTORY:   Past Surgical History:  Procedure Laterality Date  . ABDOMINAL HYSTERECTOMY    . BLEPHAROPLASTY Right 03/2017   right upper eyelid  . CARDIAC CATHETERIZATION Right 04/03/2015   Procedure: CENTRAL LINE INSERTION;  Surgeon: Sherri Rad, MD;  Location: [facility] ORS;  Service: General;  Laterality: Right;  . COLECTOMY WITH COLOSTOMY CREATION/HARTMANN PROCEDURE N/A 04/03/2015   Procedure: COLECTOMY WITH COLOSTOMY CREATION/HARTMANN PROCEDURE;  Surgeon: Sherri Rad, MD;  Location: [facility] ORS;  Service: General;  Laterality: N/A;  . COLON SURGERY    . FOOT AMPUTATION     partial  . FOOT AMPUTATION Right   . IVC FILTER INSERTION    . JOINT REPLACEMENT     Left and Right Hip  . PERIPHERAL VASCULAR CATHETERIZATION N/A 04/02/2015   Procedure: Visceral Angiography;  Surgeon: Algernon Huxley, MD;  Location: [place] [facility];  Service: Cardiovascular;  Laterality: N/A;  . PERIPHERAL VASCULAR CATHETERIZATION N/A 04/02/2015   Procedure: Visceral Artery Intervention;  Surgeon: Erskine Squibb  Lucky Cowboy, MD;  Location: [place] [facility];  Service: Cardiovascular;  Laterality: N/A;  . PERIPHERAL VASCULAR CATHETERIZATION N/A 05/25/2015   Procedure: IVC Filter Insertion;  Surgeon: Katha Cabal, MD;  Location: [place] [facility];  Service: Cardiovascular;  Laterality: N/A;  . TOE AMPUTATION     right  . TOTAL HIP ARTHROPLASTY      SOCIAL HISTORY:   Social History   Tobacco Use  . Smoking status: Never Smoker  . Smokeless  tobacco: Never Used  Substance Use Topics  . Alcohol use: No    FAMILY HISTORY:   Family History  Problem Relation Age of Onset  . Brain cancer Mother   . Heart attack Father   . COPD Father   . Coronary artery disease Father   . Hypertension Other   . Arthritis-Osteo Other   . Diabetes Brother   . Arthritis Brother   . Osteosarcoma Son   . Bone cancer Son   . Arthritis Sister   . Arthritis Brother   . Hypertension Brother   . Coronary artery disease Brother   . Malignant hyperthermia Brother     DRUG ALLERGIES:  No Known Allergies  REVIEW OF SYSTEMS:   Review of Systems  Constitutional: Negative for chills, fever and weight loss.  HENT: Negative for hearing loss and tinnitus.   Eyes: Negative for blurred vision and double vision.  Respiratory: Positive for sputum production and wheezing.        Nonproductive cough  Cardiovascular: Negative for chest pain, palpitations and leg swelling.  Gastrointestinal: Negative for abdominal pain, heartburn, nausea and vomiting.  Genitourinary: Negative for dysuria and urgency.  Musculoskeletal: Negative for myalgias and neck pain.  Skin: Negative for itching and rash.  Neurological: Negative for dizziness and headaches.  Psychiatric/Behavioral: Negative for depression and hallucinations.    MEDICATIONS AT HOME:   Prior to Admission medications   Medication Sig Start Date End Date Taking? Authorizing Provider  amLODipine (NORVASC) 5 MG tablet Take 2 tablets (10 mg total) by mouth daily. 10/31/18  Yes Fritzi Mandes, MD  betamethasone valerate ointment (VALISONE) 0.1 % Apply 1 application topically 2 (two) times daily. 11/07/18 11/07/19 Yes [provider]  cholecalciferol (VITAMIN D) 1000 units tablet Take 1,000 Units by mouth daily.   Yes [provider]  ELIQUIS 5 MG TABS tablet TAKE ONE TABLET BY MOUTH TWICE A DAY Patient taking differently: Take 5 mg by mouth 2 (two) times daily.  11/04/18  Yes Karen Kitchens, NP  estradiol (ESTRACE VAGINAL) 0.1 MG/GM vaginal cream Apply 0.5mg  (pea-sized amount)  just inside the vaginal introitus with a finger-tip on Monday, Wednesday and Friday nights. 08/09/18  Yes McGowan, Shannon A, PA-C  JAKAFI 15 MG tablet TAKE 1 TABLET (15 MG TOTAL) BY MOUTH 2 TIMES DAILY. 12/27/18  Yes Corcoran, Drue Second, MD  pantoprazole (PROTONIX) 40 MG tablet Take 1 tablet (40 mg total) by mouth daily. 09/12/18  Yes [name], MD  folic acid (FOLVITE) 638 MCG tablet Take 400 mcg by mouth daily.    [provider]  ipratropium-albuterol (DUONEB) 0.5-2.5 (3) MG/3ML SOLN Take 3 mLs by nebulization every 4 (four) hours as needed (for shortness of breath/wheezing). 11/10/18   Hillary Bow, MD  ondansetron (ZOFRAN ODT) 4 MG disintegrating tablet Take 1 tablet (4 mg total) by mouth every 8 (eight) hours as needed. 11/26/18   Alfred Levins, [place], MD  predniSONE (STERAPRED UNI-PAK 21 TAB) 10 MG (21) TBPK tablet 6 tabs day 1 and  taper 10 mg a day - 6 days Patient not taking: Reported on 02/18/2019 11/10/18   Hillary Bow, MD      VITAL SIGNS:  Blood pressure (!) 166/75, pulse 65, temperature 98.3 F (36.8 C), temperature source Oral, resp. rate 18, height 5\' 8"  (1.727 m), weight 89.1 kg, SpO2 100 %.  PHYSICAL EXAMINATION:  Physical Exam  GENERAL:  85 y.o.-year-old patient lying in the bed with no acute distress.  EYES: Pupils equal, round, reactive to light and accommodation. No scleral icterus. Extraocular muscles intact.  HEENT: Head atraumatic, normocephalic. Oropharynx and nasopharynx clear.  NECK:  Supple, no jugular venous distention. No thyroid enlargement, no tenderness.  LUNGS: Mild wheezing bilaterally.  No use of accessory muscles of respiration.  CARDIOVASCULAR: S1, S2 normal. No murmurs, rubs, or gallops.  ABDOMEN: Soft, nontender, nondistended. Bowel sounds present. No organomegaly or mass.  EXTREMITIES: No pedal edema, cyanosis, or clubbing.  Evidence of  trans-metatarsal amputation of right foot.  Findings of chronic venous stasis changes bilaterally. NEUROLOGIC: Cranial nerves II through XII are intact. Muscle strength 5/5 in both upper extremity.  3-4 over 5 in both lower extremities.  Patient reports being bedbound at baseline.  Gait not checked.  PSYCHIATRIC: The patient is alert and oriented x 3.  SKIN: No obvious rash, lesion, or ulcer.   LABORATORY PANEL:   CBC Recent Labs  Lab 02/18/19 1152  WBC 5.2  HGB 8.6*  HCT 30.9*  PLT 206   ------------------------------------------------------------------------------------------------------------------  Chemistries  Recent Labs  Lab 02/18/19 1152  NA 143  K 4.1  CL 104  CO2 35*  GLUCOSE 95  BUN 11  CREATININE 0.35*  CALCIUM 8.5*  AST 18  ALT 7  ALKPHOS 70  BILITOT 0.8   ------------------------------------------------------------------------------------------------------------------  Cardiac Enzymes Recent Labs  Lab 02/18/19 1152  TROPONINI <0.03   ------------------------------------------------------------------------------------------------------------------  RADIOLOGY:  Dg Chest Port 1 View  Result Date: 02/18/2019 CLINICAL DATA:  Short of breath EXAM: PORTABLE CHEST 1 VIEW COMPARISON:  11/08/2018 FINDINGS: Cardiac enlargement. Mild vascular congestion without edema. Elevated right hemidiaphragm with right lower lobe atelectasis unchanged. Left lung clear. Advanced degenerative change in both shoulders. IMPRESSION: Chronic elevation right hemidiaphragm with right lower lobe atelectasis Mild vascular congestion without edema. Electronically Signed   By: Franchot Gallo M.D.   On: 02/18/2019 12:25      IMPRESSION AND PLAN:  Patient is an 85 year old female with known history of polycythemia vera for which patient follows up with [facility], pulmonary embolism and DVT on chronic anticoagulation with Eliquis, chronic hypoxic respiratory failure on 2 L of oxygen at  baseline ,chronic diastolic CHF and hypertension was brought into the emergency room today with complaints of shortness of breath and some wheezing.  Diagnosed with COPD exacerbation.  1.  COPD exacerbation Patient presented with shortness of breath and wheezing. Denies any sick contacts with anyone with symptoms of covid virus infection.  No travel history. Requested for rapid flu testing as well as respiratory virus panel.  Initial chest x-ray with findings of mild vascular congestion without edema. Start CT chest without contrast requested to rule out pulmonary findings of possible covid infection which came back with no acute findings.  No evidence of active pneumonia or pulmonary edema.  See report for further details. Very low suspicion for covid infection. Placed on IV Solu-Medrol, empiric antibiotics with azithromycin.  Bronchodilator therapy.  2.  Chronic diastolic CHF Stable with no evidence of CHF exacerbation at this time.  3.  History of pulmonary  embolism and DVT Patient already on anticoagulation with Eliquis.  Continue the same  4.  History of polycythemia vera Being managed [facility]. Resumed home regimen with JAKAFI.  Daughter to bring in medication to be labile by pharmacy for administering to patient.  5.  Chronic hypoxic respiratory failure Patient uses 2 L of oxygen at home.  Currently not in any respiratory distress but is requiring slightly more oxygen at 3 L. Monitor with management of COPD as outlined above.  DVT prophylaxis; patient already on Eliquis  Called and updated patient's granddaughter Mrs. Madolyn Frieze who is also healthcare power of attorney.  Updated her on treatment plans as outlined above she is in agreement with the plan of care as outlined.  She agrees with patient's decision to be full code.  All the records are reviewed and case discussed with ED provider. Management plans discussed with the patient, family and they are in agreement.  CODE  STATUS: Full code  TOTAL TIME TAKING CARE OF THIS PATIENT: 70 minutes.    Demone Lyles M.D on 02/18/2019 at 2:22 PM  Between 7am to 6pm - Pager - [phone]  After 6pm go to [email or web address] - Proofreader  [facility]  Office  [phone]  CC: Primary care physician; [name], MD   Note: This dictation was prepared with Dragon dictation along with smaller phrase technology. Any transcriptional errors that result from this process are unintentional.

## 2019-02-18 NOTE — ED Notes (Addendum)
ED TO INPATIENT HANDOFF REPORT  ED Nurse Name and Phone #: Karena Addison [phone]  S Name/Age/Gender Yolanda Wells 85 y.o. female Room/Bed: ED06A/ED06A  Code Status   Code Status: Prior  Home/SNF/Other Home Patient oriented to: self Is this baseline? Yes   Triage Complete: Triage complete  Chief Complaint Shortness of Breath  Triage Note Pt arrived via [facility] from home with SOB . Pt has hx of COPD and has been noteating as much d/t not being able to catch her breath while eating. Pt lives with granddaughter. Pt normally wears 2L at home but has been increased to 3L to maintain oxygen sats. Also periods of bradycardia.   Allergies No Known Allergies  Level of Care/Admitting Diagnosis ED Disposition    ED Disposition Condition Comment   Admit  The patient appears reasonably stabilized for admission considering the current resources, flow, and capabilities available in the ED at this time, and I doubt any other [facility] requiring further screening and/or treatment in the ED prior to admission is  present.       B Medical/Surgery History Past Medical History:  Diagnosis Date  . Acute deep vein thrombosis (DVT) of distal vein of left lower extremity ([place]) 10/03/2015  . Acute pulmonary embolism ([place]) 10/03/2015  . Anticoagulant long-term use   . Arthritis   . Arthritis   . CHF (congestive heart failure) ([place])   . Chronic anticoagulation 10/03/2015  . Collagen vascular disease ([place])   . Colostomy in place [facility]) 10/04/2015  . Deep venous thrombosis (HCC)    right lower extremity  . Dependent edema   . Diverticulosis of intestine with bleeding 04/02/2015  . Glenohumeral arthritis 06/28/2012  . Hammertoe 09/02/2012  . History of bilateral hip replacements 09/02/2012  . History of hysterectomy   . Hypertension   . Hypertension   . Hypokalemia   . Inability to ambulate due to hip 10/12/2015  . Mandibular dysfunction   . Onychomycosis 09/02/2012  . Osteoarthritis   . Polycythemia  vera ([place])   . Presence of IVC filter 05/25/2015  . Stroke ([place]) 03/26/2015   cerebellar  . Urinary incontinence in female 09/02/2016   Past Surgical History:  Procedure Laterality Date  . ABDOMINAL HYSTERECTOMY    . BLEPHAROPLASTY Right 03/2017   right upper eyelid  . CARDIAC CATHETERIZATION Right 04/03/2015   Procedure: CENTRAL LINE INSERTION;  Surgeon: Sherri Rad, MD;  Location: [facility] ORS;  Service: General;  Laterality: Right;  . COLECTOMY WITH COLOSTOMY CREATION/HARTMANN PROCEDURE N/A 04/03/2015   Procedure: COLECTOMY WITH COLOSTOMY CREATION/HARTMANN PROCEDURE;  Surgeon: Sherri Rad, MD;  Location: [facility] ORS;  Service: General;  Laterality: N/A;  . COLON SURGERY    . FOOT AMPUTATION     partial  . FOOT AMPUTATION Right   . IVC FILTER INSERTION    . JOINT REPLACEMENT     Left and Right Hip  . PERIPHERAL VASCULAR CATHETERIZATION N/A 04/02/2015   Procedure: Visceral Angiography;  Surgeon: Algernon Huxley, MD;  Location: [place] [facility];  Service: Cardiovascular;  Laterality: N/A;  . PERIPHERAL VASCULAR CATHETERIZATION N/A 04/02/2015   Procedure: Visceral Artery Intervention;  Surgeon: Algernon Huxley, MD;  Location: [place] [facility];  Service: Cardiovascular;  Laterality: N/A;  . PERIPHERAL VASCULAR CATHETERIZATION N/A 05/25/2015   Procedure: IVC Filter Insertion;  Surgeon: Katha Cabal, MD;  Location: [place] [facility];  Service: Cardiovascular;  Laterality: N/A;  . TOE AMPUTATION     right  . TOTAL HIP ARTHROPLASTY  A IV Location/Drains/Wounds Patient Lines/Drains/Airways Status   Active Line/Drains/Airways    Name:   Placement date:   Placement time:   Site:   Days:   Colostomy LLQ   04/03/15    1256    LLQ   1417   Colostomy LLQ   06/15/18    1648    LLQ   248   Pressure Injury 08/13/18 Unstageable - Full thickness tissue loss in which the base of the ulcer is covered by slough (yellow, tan, gray, green or brown) and/or eschar (tan, brown or black) in the wound bed.  1 cm x 1 cm to left buttock. Area dark. Unable   08/13/18    0000     189   Pressure Injury 08/13/18 Unstageable - Full thickness tissue loss in which the base of the ulcer is covered by slough (yellow, tan, gray, green or brown) and/or eschar (tan, brown or black) in the wound bed. 0.5 cm x 3 cm to dark area to coccyx.   08/13/18    0000     189   Wound / Incision (Open or Dehisced) 09/27/18 Buttocks Left   09/27/18    2025    Buttocks   144          Intake/Output Last 24 hours No intake or output data in the 24 hours ending 02/18/19 1413  Labs/Imaging Results for orders placed or performed during the hospital encounter of 02/18/19 (from the past 48 hour(s))  CBC with Differential     Status: Abnormal   Collection Time: 02/18/19 11:52 AM  Result Value Ref Range   WBC 5.2 4.0 - 10.5 K/uL   RBC 3.60 (L) 3.87 - 5.11 MIL/uL   Hemoglobin 8.6 (L) 12.0 - 15.0 g/dL   HCT 30.9 (L) 36.0 - 46.0 %   MCV 85.8 80.0 - 100.0 fL   MCH 23.9 (L) 26.0 - 34.0 pg   MCHC 27.8 (L) 30.0 - 36.0 g/dL   RDW 28.6 (H) 11.5 - 15.5 %   Platelets 206 150 - 400 K/uL   nRBC 2.1 (H) 0.0 - 0.2 %   Neutrophils Relative % 68 %   Neutro Abs 3.5 1.7 - 7.7 K/uL   Lymphocytes Relative 20 %   Lymphs Abs 1.0 0.7 - 4.0 K/uL   Monocytes Relative 5 %   Monocytes Absolute 0.3 0.1 - 1.0 K/uL   Eosinophils Relative 1 %   Eosinophils Absolute 0.1 0.0 - 0.5 K/uL   Basophils Relative 0 %   Basophils Absolute 0.0 0.0 - 0.1 K/uL   RBC Morphology POLYCHROMASIA PRESENT     Comment: Spherocytes present MIXED RBC POPULATION    Immature Granulocytes 6 %   Abs Immature Granulocytes 0.32 (H) 0.00 - 0.07 K/uL    Comment: Performed at [facility], [address] [place], [place] [address]  Comprehensive metabolic panel     Status: Abnormal   Collection Time: 02/18/19 11:52 AM  Result Value Ref Range   Sodium 143 135 - 145 mmol/L    Comment: ELECTROLYTES REPEATED MU/PMF   Potassium 4.1 3.5 - 5.1 mmol/L   Chloride 104 98  - 111 mmol/L   CO2 35 (H) 22 - 32 mmol/L   Glucose, Bld 95 70 - 99 mg/dL   BUN 11 8 - 23 mg/dL   Creatinine, Ser 0.35 (L) 0.44 - 1.00 mg/dL   Calcium 8.5 (L) 8.9 - 10.3 mg/dL   Total Protein 7.0 6.5 - 8.1 g/dL  Albumin 3.1 (L) 3.5 - 5.0 g/dL   AST 18 15 - 41 U/L   ALT 7 0 - 44 U/L   Alkaline Phosphatase 70 38 - 126 U/L   Total Bilirubin 0.8 0.3 - 1.2 mg/dL   GFR calc non Af Amer >60 >60 mL/min   GFR calc Af Amer >60 >60 mL/min   Anion gap 4 (L) 5 - 15    Comment: Performed at [facility], [address], [place], [place] [address]  Brain natriuretic peptide     Status: None   Collection Time: 02/18/19 11:52 AM  Result Value Ref Range   B Natriuretic Peptide 89.0 0.0 - 100.0 pg/mL    Comment: Performed at [facility], [address] [place], [place] [address]  Troponin I - Once     Status: None   Collection Time: 02/18/19 11:52 AM  Result Value Ref Range   Troponin I <0.03 <0.03 ng/mL    Comment: Performed at [facility], [address], [place],  [address]   Dg Chest Port 1 View  Result Date: 02/18/2019 CLINICAL DATA:  Short of breath EXAM: PORTABLE CHEST 1 VIEW COMPARISON:  11/08/2018 FINDINGS: Cardiac enlargement. Mild vascular congestion without edema. Elevated right hemidiaphragm with right lower lobe atelectasis unchanged. Left lung clear. Advanced degenerative change in both shoulders. IMPRESSION: Chronic elevation right hemidiaphragm with right lower lobe atelectasis Mild vascular congestion without edema. Electronically Signed   By: Franchot Gallo M.D.   On: 02/18/2019 12:25    Pending Labs Unresulted Labs (From admission, onward)   None      Vitals/Pain Today's Vitals   02/18/19 1129 02/18/19 1130 02/18/19 1230 02/18/19 1300  BP: (!) 185/85 104/89 (!) 171/73 (!) 166/75  Pulse: 83  67 65  Resp: (!) 30 (!) 24 (!) 29 18  Temp: 98.3 F (36.8 C)     TempSrc: Oral     SpO2: (!) 88% (!) 80% 100% 100%  Weight:  89.1 kg     Height:  5\' 8"  (1.727 m)    PainSc:  0-No pain      Isolation Precautions Droplet and Contact precautions  Medications Medications  methylPREDNISolone sodium succinate (SOLU-MEDROL) 125 mg/2 mL injection 125 mg (125 mg Intravenous Given 02/18/19 1209)  albuterol (PROVENTIL HFA;VENTOLIN HFA) 108 (90 Base) MCG/ACT inhaler 2 puff (2 puffs Inhalation Given 02/18/19 1305)    Mobility non-ambulatory Moderate fall risk   Focused Assessments    R Recommendations: See Admitting Provider Note  Report given to: Shirlean Mylar, RN

## 2019-02-18 NOTE — Progress Notes (Signed)
Care Alignment Note  Advanced Directives Documents (Living Will, Power of Attorney) currently in the EHR no advanced directives documents available .  Has the patient discussed their wishes with their family/healthcare power of attorney yes. How much does the family or healthcare power of attorney know about their wishes. I discussed with the patient's granddaughter Ms. Madolyn Frieze who is also healthcare power of attorney.  She understands patient clinical conditions including underlying COPD on chronic CHF as well as history of pulmonary embolism on anticoagulation.  She agrees with patient's decision to be full code  What does the patient/decision maker understand about their medical condition and the natural course of their disease.  Patient's granddaughter who is healthcare power of attorney understands patient has history of COPD and here with COPD exacerbation and agrees with management as outlined.  What is the patient/decision maker's biggest fear or concern for the future pain and suffering   What is the most important goal for this patient should their health condition worsen longevity.  Current   Code Status: Prior  Current code status has been reviewed/updated.  Time spent:30 Minutes

## 2019-02-19 LAB — BASIC METABOLIC PANEL
Anion gap: 4 — ABNORMAL LOW (ref 5–15)
BUN: 11 mg/dL (ref 8–23)
CO2: 34 mmol/L — ABNORMAL HIGH (ref 22–32)
Calcium: 8.6 mg/dL — ABNORMAL LOW (ref 8.9–10.3)
Chloride: 105 mmol/L (ref 98–111)
Creatinine, Ser: 0.39 mg/dL — ABNORMAL LOW (ref 0.44–1.00)
GFR calc Af Amer: >60 mL/min (ref >60)
GFR calc non Af Amer: >60 mL/min (ref >60)
Glucose, Bld: 124 mg/dL — ABNORMAL HIGH (ref 70–99)
Potassium: 4 mmol/L (ref 3.5–5.1)
Sodium: 143 mmol/L (ref 135–145)

## 2019-02-19 LAB — CBC
HCT: 29.8 % — ABNORMAL LOW (ref 36.0–46.0)
Hemoglobin: 8.3 g/dL — ABNORMAL LOW (ref 12.0–15.0)
MCH: 23.2 pg — ABNORMAL LOW (ref 26.0–34.0)
MCHC: 27.9 g/dL — ABNORMAL LOW (ref 30.0–36.0)
MCV: 83.5 fL (ref 80.0–100.0)
Platelets: 202 10*3/uL (ref 150–400)
RBC: 3.57 MIL/uL — ABNORMAL LOW (ref 3.87–5.11)
RDW: 28.2 % — ABNORMAL HIGH (ref 11.5–15.5)
WBC: 7.2 10*3/uL (ref 4.0–10.5)
nRBC: 1.9 % — ABNORMAL HIGH (ref 0.0–0.2)

## 2019-02-19 LAB — MAGNESIUM: Magnesium: 1.9 mg/dL (ref 1.7–2.4)

## 2019-02-19 MED ORDER — IPRATROPIUM-ALBUTEROL 0.5-2.5 (3) MG/3ML IN SOLN
3.0000 mL | Freq: Four times a day (QID) | RESPIRATORY_TRACT | Status: DC
  Filled 2019-02-19 (×3): qty 3

## 2019-02-19 MED ORDER — TEMAZEPAM 15 MG PO CAPS
15.0000 mg | ORAL_CAPSULE | Freq: Every evening | ORAL | Status: DC | PRN
  Administered 2019-02-19: 15 mg via ORAL
  Filled 2019-02-19: qty 1

## 2019-02-19 MED ORDER — LORAZEPAM 1 MG PO TABS
1.0000 mg | ORAL_TABLET | Freq: Two times a day (BID) | ORAL | Status: DC | PRN
  Administered 2019-02-20: 16:00:00 1 mg via ORAL
  Filled 2019-02-19: qty 1

## 2019-02-19 NOTE — Progress Notes (Signed)
Patient is arguing and refusing IV insertion. I spoke with the nurse and told her to put an order in if she changes her mind.

## 2019-02-19 NOTE — Progress Notes (Addendum)
[facility] NAME: Yolanda Wells    MR#:  [id number]  DATE OF BIRTH:  December 19, 1931  SUBJECTIVE:  CHIEF COMPLAINT: Patient shortness of breath is better but still coughing  REVIEW OF SYSTEMS:  CONSTITUTIONAL: No fever, fatigue or weakness.  EYES: No blurred or double vision.  EARS, NOSE, AND THROAT: No tinnitus or ear pain.  RESPIRATORY: Reports intermittent episodes of cough, exertional shortness of breath, denies wheezing or hemoptysis.  CARDIOVASCULAR: No chest pain, orthopnea, edema.  GASTROINTESTINAL: No nausea, vomiting, diarrhea or abdominal pain.  GENITOURINARY: No dysuria, hematuria.  ENDOCRINE: No polyuria, nocturia,  HEMATOLOGY: No anemia, easy bruising or bleeding SKIN: No rash or lesion. MUSCULOSKELETAL: No joint pain or arthritis.   NEUROLOGIC: No tingling, numbness, weakness.  PSYCHIATRY: No anxiety or depression.   DRUG ALLERGIES:  No Known Allergies  VITALS:  Blood pressure 100/65, pulse 79, temperature 98.5 F (36.9 C), temperature source Oral, resp. rate 16, height 5\' 8"  (1.727 m), weight 89.1 kg, SpO2 97 %.  PHYSICAL EXAMINATION:  GENERAL:  85 y.o.-year-old patient lying in the bed with no acute distress.  EYES: Pupils equal, round, reactive to light and accommodation. No scleral icterus. Extraocular muscles intact.  HEENT: Head atraumatic, normocephalic. Oropharynx and nasopharynx clear.  NECK:  Supple, no jugular venous distention. No thyroid enlargement, no tenderness.  LUNGS: Moderate breath sounds bilaterally, no wheezing, rales,rhonchi or crepitation. No use of accessory muscles of respiration.  CARDIOVASCULAR: S1, S2 normal. No murmurs, rubs, or gallops.  ABDOMEN: Soft, nontender, nondistended. Bowel sounds present. No organomegaly or mass.  EXTREMITIES: No pedal edema, cyanosis, or clubbing.  NEUROLOGIC: Awake, alert and oriented x3 sensation intact. Gait not checked.  PSYCHIATRIC: The  patient is alert and oriented x 3.  SKIN: No obvious rash, lesion, or ulcer.    LABORATORY PANEL:   CBC Recent Labs  Lab 02/19/19 0520  WBC 7.2  HGB 8.3*  HCT 29.8*  PLT 202   ------------------------------------------------------------------------------------------------------------------  Chemistries  Recent Labs  Lab 02/18/19 1152 02/19/19 0520  NA 143 143  K 4.1 4.0  CL 104 105  CO2 35* 34*  GLUCOSE 95 124*  BUN 11 11  CREATININE 0.35* 0.39*  CALCIUM 8.5* 8.6*  MG  --  1.9  AST 18  --   ALT 7  --   ALKPHOS 70  --   BILITOT 0.8  --    ------------------------------------------------------------------------------------------------------------------  Cardiac Enzymes Recent Labs  Lab 02/18/19 1152  TROPONINI <0.03   ------------------------------------------------------------------------------------------------------------------  RADIOLOGY:  Ct Chest Wo Contrast  Result Date: 02/18/2019 CLINICAL DATA:  Shortness of breath for the last 2 days. History of COPD on 2 L of oxygen, CHF, history of pulmonary embolism and other medical problems. Occasional dry cough. No fever. EXAM: CT CHEST WITHOUT CONTRAST TECHNIQUE: Multidetector CT imaging of the chest was performed following the standard protocol without IV contrast. COMPARISON:  Chest CTs dated 11/22/2018 and 09/21/2018. FINDINGS: Cardiovascular: Aortic atherosclerosis. No thoracic aortic aneurysm. Cardiomegaly. No pericardial effusion seen. Prominent mitral annulus calcifications again noted. Mediastinum/Nodes: No mass or enlarged lymph nodes seen within the mediastinum or perihilar regions. Esophagus is unremarkable. Trachea appears normal. Lungs/Pleura: Chronic consolidation at the RIGHT lung base, not appreciably changed compared to CT abdomen of 12/03/2018, questionably increased compared to earlier chest CT exams. Additional mild chronic appearing atelectasis at the LEFT lung base. Lungs otherwise clear. No  pleural effusion or pneumothorax seen. No consolidations or ground-glass opacities to suggest COVID-19 infection.  Upper Abdomen: Large gallstone within the gallbladder, incompletely imaged, also seen on previous exams. RIGHT renal cyst, partially imaged. No acute findings within the upper abdomen. Musculoskeletal: Degenerative change throughout the scoliotic thoracolumbar spine, moderate in degree. Advanced degenerative changes at the bilateral shoulders, incompletely imaged. No acute or suspicious osseous finding. IMPRESSION: 1. No acute findings. No evidence of active pneumonia or pulmonary edema. 2. Chronic consolidation/atelectasis at the RIGHT lung base, stable compared to CT abdomen of 12/03/2018. Mild chronic-appearing atelectasis at the LEFT lung base. 3. Cardiomegaly. No pericardial effusion. 4. Additional chronic/incidental findings detailed above. Aortic Atherosclerosis (ICD10-I70.0). Electronically Signed   By: Franki Cabot M.D.   On: 02/18/2019 14:54   Dg Chest Port 1 View  Result Date: 02/18/2019 CLINICAL DATA:  Short of breath EXAM: PORTABLE CHEST 1 VIEW COMPARISON:  11/08/2018 FINDINGS: Cardiac enlargement. Mild vascular congestion without edema. Elevated right hemidiaphragm with right lower lobe atelectasis unchanged. Left lung clear. Advanced degenerative change in both shoulders. IMPRESSION: Chronic elevation right hemidiaphragm with right lower lobe atelectasis Mild vascular congestion without edema. Electronically Signed   By: Franchot Gallo M.D.   On: 02/18/2019 12:25    EKG:   Orders placed or performed during the hospital encounter of 02/18/19  . EKG 12-Lead  . EKG 12-Lead  . EKG 12-Lead  . EKG 12-Lead    ASSESSMENT AND PLAN:   Patient is an 85 year old female with known history of polycythemia vera for which patient follows up with [facility], pulmonary embolism and DVT on chronic anticoagulation with Eliquis, chronic hypoxic respiratory failure on 2 L of oxygen at baseline  ,chronic diastolic CHF and hypertension was brought into the emergency room today with complaints of shortness of breath and some wheezing.  Diagnosed with COPD exacerbation.  1.  COPD exacerbation Patient presented with shortness of breath and wheezing. Clinically feeling better continue steroids and antibiotic azithromycin. Bronchodilator treatment Denies any sick contacts with anyone with symptoms of covid virus infection.  No travel history. Flu test and respiratory panel are negative   Initial chest x-ray with findings of mild vascular congestion without edema. Start CT chest without contrast requested to rule out pulmonary findings of possible covid infection which came back with no acute findings.  No evidence of active pneumonia or pulmonary edema.  See report for further details. Very low suspicion for covid infection. .  2.  Chronic diastolic CHF Stable with no evidence of CHF exacerbation at this time.  3.  History of pulmonary embolism and DVT Patient already on anticoagulation with Eliquis.  Continue the same  4.  History of polycythemia vera Being managed at [facility]. Resumed home regimen with JAKAFI.  Daughter to bring in medication to be labile by pharmacy for administering to patient.  5.  Chronic hypoxic respiratory failure Patient uses 2 L of oxygen at home.  Currently not in any respiratory distress but is requiring slightly more oxygen at 3 L. Monitor with management of COPD as outlined above.   #Paroxysmal SVT One episode no recurrence patient is asymptomatic continue close monitoring  DVT prophylaxis patient on Eliquis  All the records are reviewed and case discussed with Care Management/Social Workerr. Management plans discussed with the patient, family and they are in agreement.  CODE STATUS: fc  TOTAL TIME TAKING CARE OF THIS PATIENT: 36  minutes.   POSSIBLE D/C IN 1-2  DAYS, DEPENDING ON CLINICAL CONDITION.  Note: This dictation was prepared with  Dragon dictation along with smaller phrase technology. Any transcriptional errors that  result from this process are unintentional.   Nicholes Mango M.D on 02/19/2019 at 4:59 PM  Between 7am to 6pm - Pager - [phone] After 6pm go to [email or web address] - password [email or web address] [facility]  Office  [phone]  CC: Primary care physician; [name], MD

## 2019-02-19 NOTE — Progress Notes (Signed)
   02/19/19 2000  Clinical Encounter Type  Visited With Patient  Visit Type Initial;Spiritual support  Referral From Nurse  Consult/Referral To [place] (Comment)  Chaplain received page from nurse to come and visit patient. Patient was agitated. Chaplain arrived and talk with patient. Patient seemed confused. Chaplain tried to calm patient down with encouraging words but patient was not responding in a positive manner.Chaplain spoke with nurse to see if there was something else she could do and nurse said no and thanked Clinical biochemist for coming.

## 2019-02-19 NOTE — Progress Notes (Addendum)
Central Telemetry called reporting run of "SVT" 15 beats. Patient alert, drinking decaf coffee. VS 113/ 61,p 72 100% resp 27m. Dr. Margaretmary Eddy notified.

## 2019-02-20 MED ORDER — PREDNISONE 10 MG (21) PO TBPK: 10.0000 mg | ORAL_TABLET | Freq: Every day | ORAL | 0 refills | Status: AC

## 2019-02-20 MED ORDER — METHYLPREDNISOLONE SODIUM SUCC 40 MG IJ SOLR
40.0000 mg | Freq: Two times a day (BID) | INTRAMUSCULAR | Status: DC
  Administered 2019-02-20: 18:00:00 40 mg via INTRAVENOUS
  Filled 2019-02-20: qty 1

## 2019-02-20 MED ORDER — ALBUTEROL SULFATE HFA 108 (90 BASE) MCG/ACT IN AERS: 2.0000 | INHALATION_SPRAY | Freq: Four times a day (QID) | RESPIRATORY_TRACT | 1 refills | Status: AC | PRN

## 2019-02-20 NOTE — Discharge Instructions (Signed)
Follow-up with primary care physician in 3 days Outpatient sleep study Continue 2 L of oxygen via nasal cannula nightly

## 2019-02-20 NOTE — TOC Transition Note (Signed)
Transition of Care [facility]) - CM/SW Discharge Note   Patient Details  Name: Yolanda Wells MRN: [id number] Date of Birth: 02-26-1932  Transition of Care [facility] CM/SW Contact:  Latanya Maudlin, RN Phone Number: 02/20/2019, 2:26 PM   Clinical Narrative:  Patient on nocturnal oxygen. Will now require full time O2. SATS in place. Notified Brad from [employer] of change in need. We will use EMS to get home, place patient on concentrator until further O2 can be delivered.        Final next level of care: [place] Barriers to Discharge: No Barriers Identified   Patient Goals and CMS Choice        Discharge Placement                       Discharge Plan and Services                DME Arranged: Oxygen DME Agency: [facility]       Social Determinants of Health (SDOH) Interventions     Readmission Risk Interventions Readmission Risk Prevention Plan 09/25/2018 09/01/2018  Transportation Screening Complete Complete  Medication Review Press photographer) Complete Complete  PCP or Specialist appointment within 3-5 days of discharge Complete Complete  HRI or Home Care Consult Complete Complete  SW Recovery Care/Counseling Consult Complete Complete  Palliative Care Screening Complete Complete  Medication Reconcilation (Pharmacy) Complete Complete  Skilled Nursing Facility Complete Not Complete  Some recent data might be hidden

## 2019-02-20 NOTE — Discharge Summary (Addendum)
Yolanda Wells at [place] Yolanda Wells: Yolanda Wells    MR#:  [id number]  DATE OF BIRTH:  1931/11/25  DATE OF ADMISSION:  02/18/2019 ADMITTING PHYSICIAN: Otila Back, MD  DATE OF DISCHARGE: 02/20/19 PRIMARY CARE PHYSICIAN: [name], MD    ADMISSION DIAGNOSIS:  SOB (shortness of breath) [R06.02] COPD exacerbation (HCC) [J44.1]  DISCHARGE DIAGNOSIS:  Active Problems:   COPD exacerbation ([place])   SECONDARY DIAGNOSIS:   Past Medical History:  Diagnosis Date  . Acute deep vein thrombosis (DVT) of distal vein of left lower extremity ([place]) 10/03/2015  . Acute pulmonary embolism ([place]) 10/03/2015  . Anticoagulant long-term use   . Arthritis   . Arthritis   . CHF (congestive heart failure) ([place])   . Chronic anticoagulation 10/03/2015  . Collagen vascular disease ([place])   . Colostomy in place [facility]) 10/04/2015  . Deep venous thrombosis (HCC)    right lower extremity  . Dependent edema   . Diverticulosis of intestine with bleeding 04/02/2015  . Glenohumeral arthritis 06/28/2012  . Hammertoe 09/02/2012  . History of bilateral hip replacements 09/02/2012  . History of hysterectomy   . Hypertension   . Hypertension   . Hypokalemia   . Inability to ambulate due to hip 10/12/2015  . Mandibular dysfunction   . Onychomycosis 09/02/2012  . Osteoarthritis   . Polycythemia vera ([place])   . Presence of IVC filter 05/25/2015  . 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 Urinary incontinence in female 09/02/2016    HOSPITAL COURSE:   HPI Yolanda Wells  is a 85 y.o. female with a known history of polycythemia vera for which patient follows up with [facility], pulmonary embolism and DVT on chronic anticoagulation with Eliquis, chronic hypoxic respiratory failure on 2 L of oxygen at baseline ,chronic diastolic CHF and hypertension was brought into the emergency room today with complaints of shortness of breath and some wheezing.  Occasional nonproductive  cough.  No fevers.  No sick contacts.  No recent travel history.  No nausea vomiting or diarrhea.  No abdominal pains. Patient usually uses 2 L of oxygen.  Was noted to be hypoxic with O2 sat of 88% on 2 L.  Was placed on 3 L with improvement in oxygen saturation to 98%. Patient was evaluated in the emergency room and laboratory studies done unremarkable.  Chest x-ray done revealed mild vascular congestion without edema.  BNP unremarkable.  Rapid flu test done respiratory virus panel already requested.  Stat CT chest requested for further evaluation.   1.COPD exacerbation Patient presented with shortness of breath and wheezing. Clinically feeling better continue tapering steroids and antibiotic azithromycin. Bronchodilator treatment Denies any sick contacts with anyone with symptoms ofcovidvirus infection. No travel history. Flu test and respiratory panel are negative  Initial chest x-ray with findings of mild vascular congestion without edema. Start CT chest without contrast - no acute findings. No evidence of active pneumonia or pulmonary edema. See report for further details. Very low suspicion forcovidinfection. Wean off oxygen to 2 L nightly.  Requiring 1 L of oxygen during daytime .  2.Chronic diastolic CHF Stable with no evidence of CHF exacerbation at this time.  3.History of pulmonary embolism and DVT Patient already on anticoagulation with Eliquis. Continue the same  4.History of polycythemia vera Being managed at [facility]. Resumed home regimen withJAKAFI.Daughter to bring in medication to be labile by pharmacy for administering to patient.  5.Chronic hypoxic respiratory failure Patient uses 2 L of oxygen  at home during nights.  Needs 1 L of oxygen during daytime and qualified for 1 L.  Case management to arrange that  #Obstructive sleep apnea Patient needs outpatient follow-up to get second part of the sleep study according to the  granddaughter  #Paroxysmal SVT One episode no recurrence patient is asymptomatic continue close monitoring  DVT prophylaxis patient on Eliquis  Plan of care discussed with the patient and granddaughter over phone.  Agreeable with current plan of care  DISCHARGE CONDITIONS:   Stable   CONSULTS OBTAINED:     PROCEDURES  NONE   DRUG ALLERGIES:  No Known Allergies  DISCHARGE MEDICATIONS:   Allergies as of 02/20/2019   No Known Allergies     Medication List    TAKE these medications   albuterol 108 (90 Base) MCG/ACT inhaler Commonly known as:  PROVENTIL HFA;VENTOLIN HFA Inhale 2 puffs into the lungs every 6 (six) hours as needed for wheezing or shortness of breath.   amLODipine 5 MG tablet Commonly known as:  NORVASC Take 2 tablets (10 mg total) by mouth daily.   aspirin EC 81 MG tablet Take 81 mg by mouth daily.   cholecalciferol 1000 units tablet Commonly known as:  VITAMIN D Take 1,000 Units by mouth daily.   docusate sodium 100 MG capsule Commonly known as:  COLACE Take 100 mg by mouth 2 (two) times daily.   Eliquis 5 MG Tabs tablet Generic drug:  apixaban TAKE ONE TABLET BY MOUTH TWICE A DAY What changed:  how much to take   estradiol 0.1 MG/GM vaginal cream Commonly known as:  ESTRACE VAGINAL Apply 0.5mg  (pea-sized amount)  just inside the vaginal introitus with a finger-tip on Monday, Wednesday and Friday nights.   folic acid 622 MCG tablet Commonly known as:  FOLVITE Take 400 mcg by mouth daily.   furosemide 20 MG tablet Commonly known as:  LASIX Take 20 mg by mouth as needed for fluid (TAKING ATLEAST ONCE A WEEK).   gabapentin 300 MG capsule Commonly known as:  NEURONTIN Take 300 mg by mouth at bedtime.   ipratropium-albuterol 0.5-2.5 (3) MG/3ML Soln Commonly known as:  DUONEB Take 3 mLs by nebulization every 4 (four) hours as needed (for shortness of breath/wheezing).   Jakafi 15 MG tablet Generic drug:  ruxolitinib phosphate TAKE 1  TABLET (15 MG TOTAL) BY MOUTH 2 TIMES DAILY.   ondansetron 4 MG disintegrating tablet Commonly known as:  Zofran ODT Take 1 tablet (4 mg total) by mouth every 8 (eight) hours as needed.   pantoprazole 40 MG tablet Commonly known as:  PROTONIX Take 1 tablet (40 mg total) by mouth daily.   polyethylene glycol 17 g packet Commonly known as:  MIRALAX / GLYCOLAX Take 17 g by mouth 4 (four) times a week. Monday, Wednesday, Saturday, Sunday   potassium chloride SA 20 MEQ tablet Commonly known as:  K-DUR,KLOR-CON Take 20 mEq by mouth as needed (ONLY TAKE WHEN TAKING LASIX).   predniSONE 10 MG (21) Tbpk tablet Commonly known as:  STERAPRED UNI-PAK 21 TAB Take 1 tablet (10 mg total) by mouth daily. Take 6 tablets by mouth for 1 day followed by  5 tablets by mouth for 1 day followed by  4 tablets by mouth for 1 day followed by  3 tablets by mouth for 1 day followed by  2 tablets by mouth for 1 day followed by  1 tablet by mouth for a day and stop What changed:    how much to  take  how to take this  when to take this  additional instructions        DISCHARGE INSTRUCTIONS:  Follow-up with primary care physician in 3 days Outpatient sleep study Continue 2 L of oxygen via nasal cannula nightly.  1 L during daytime  DIET:  Cardiac diet  DISCHARGE CONDITION:  Fair  ACTIVITY:  Activity as tolerated  OXYGEN:  Home Oxygen: Yes.     Oxygen Delivery: 2 liters/min via nasal cannula nightly  DISCHARGE LOCATION:  home   If you experience worsening of your admission symptoms, develop shortness of breath, life threatening emergency, suicidal or homicidal thoughts you must seek medical attention immediately by calling 911 or calling your MD immediately  if symptoms less severe.  You Must read complete instructions/literature along with all the possible adverse reactions/side effects for all the Medicines you take and that have been prescribed to you. Take any new Medicines after  you have completely understood and accpet all the possible adverse reactions/side effects.   Please note  You were cared for by a hospitalist during your hospital stay. If you have any questions about your discharge medications or the care you received while you were in the hospital after you are discharged, you can call the unit and asked to speak with the hospitalist on call if the hospitalist that took care of you is not available. Once you are discharged, your primary care physician will handle any further medical issues. Please note that NO REFILLS for any discharge medications will be authorized once you are discharged, as it is imperative that you return to your primary care physician (or establish a relationship with a primary care physician if you do not have one) for your aftercare needs so that they can reassess your need for medications and monitor your lab values.     Today  Chief Complaint  Patient presents with  . Shortness of Breath   Patient is feeling better.  Oxygen weaned off.  Patient lives on 2 L of oxygen via nasal cannula nightly.  Wants to go home discussed with the granddaughter over phone, agreeable.  Patient is sundowning during the night in the hospital setting  ROS:  CONSTITUTIONAL: Denies fevers, chills. Denies any fatigue, weakness.  EYES: Denies blurry vision, double vision, eye pain. EARS, NOSE, THROAT: Denies tinnitus, ear pain, hearing loss. RESPIRATORY: Denies cough, wheeze, shortness of breath.  CARDIOVASCULAR: Denies chest pain, palpitations, edema.  GASTROINTESTINAL: Denies nausea, vomiting, diarrhea, abdominal pain. Denies bright red blood per rectum. GENITOURINARY: Denies dysuria, hematuria. ENDOCRINE: Denies nocturia or thyroid problems. HEMATOLOGIC AND LYMPHATIC: Denies easy bruising or bleeding. SKIN: Denies rash or lesion. MUSCULOSKELETAL: Denies pain in neck, back, shoulder, knees, hips or arthritic symptoms.  NEUROLOGIC: Denies paralysis,  paresthesias.  PSYCHIATRIC: Denies anxiety or depressive symptoms.   VITAL SIGNS:  Blood pressure 101/82, pulse 72, temperature 98.6 F (37 C), temperature source Oral, resp. rate 20, height 5\' 8"  (1.727 m), weight 89.1 kg, SpO2 95 %.  I/O:    Intake/Output Summary (Last 24 hours) at 02/20/2019 1235 Last data filed at 02/20/2019 8099 Gross per 24 hour  Intake 420 ml  Output 1300 ml  Net -880 ml    PHYSICAL EXAMINATION:  GENERAL:  85 y.o.-year-old patient lying in the bed with no acute distress.  EYES: Pupils equal, round, reactive to light and accommodation. No scleral icterus. Extraocular muscles intact.  HEENT: Head atraumatic, normocephalic. Oropharynx and nasopharynx clear.  NECK:  Supple, no jugular venous distention. No  thyroid enlargement, no tenderness.  LUNGS: Normal breath sounds bilaterally, no wheezing, rales,rhonchi or crepitation. No use of accessory muscles of respiration.  CARDIOVASCULAR: S1, S2 normal. No murmurs, rubs, or gallops.  ABDOMEN: Soft, non-tender, non-distended. Bowel sounds present.  EXTREMITIES: No pedal edema, cyanosis, or clubbing.  NEUROLOGIC: Awake alert and oriented x3 sensation intact. Gait not checked.  PSYCHIATRIC: The patient is alert and oriented x 3.  SKIN: No obvious rash, lesion, or ulcer.   DATA REVIEW:   CBC Recent Labs  Lab 02/19/19 0520  WBC 7.2  HGB 8.3*  HCT 29.8*  PLT 202    Chemistries  Recent Labs  Lab 02/18/19 1152 02/19/19 0520  NA 143 143  K 4.1 4.0  CL 104 105  CO2 35* 34*  GLUCOSE 95 124*  BUN 11 11  CREATININE 0.35* 0.39*  CALCIUM 8.5* 8.6*  MG  --  1.9  AST 18  --   ALT 7  --   ALKPHOS 70  --   BILITOT 0.8  --     Cardiac Enzymes Recent Labs  Lab 02/18/19 1152  TROPONINI <0.03    Microbiology Results  Results for orders placed or performed during the hospital encounter of 02/18/19  Respiratory Panel by PCR     Status: None   Collection Time: 02/18/19  2:28 PM  Result Value Ref Range  Status   Adenovirus NOT DETECTED NOT DETECTED Final   Coronavirus 229E NOT DETECTED NOT DETECTED Final    Comment: (NOTE) The Coronavirus on the Respiratory Panel, DOES NOT test for the novel  Coronavirus (2019 nCoV)    Coronavirus HKU1 NOT DETECTED NOT DETECTED Final   Coronavirus NL63 NOT DETECTED NOT DETECTED Final   Coronavirus OC43 NOT DETECTED NOT DETECTED Final   Metapneumovirus NOT DETECTED NOT DETECTED Final   Rhinovirus / Enterovirus NOT DETECTED NOT DETECTED Final   Influenza A NOT DETECTED NOT DETECTED Final   Influenza B NOT DETECTED NOT DETECTED Final   Parainfluenza Virus 1 NOT DETECTED NOT DETECTED Final   Parainfluenza Virus 2 NOT DETECTED NOT DETECTED Final   Parainfluenza Virus 3 NOT DETECTED NOT DETECTED Final   Parainfluenza Virus 4 NOT DETECTED NOT DETECTED Final   Respiratory Syncytial Virus NOT DETECTED NOT DETECTED Final   Bordetella pertussis NOT DETECTED NOT DETECTED Final   Chlamydophila pneumoniae NOT DETECTED NOT DETECTED Final   Mycoplasma pneumoniae NOT DETECTED NOT DETECTED Final    Comment: Performed at [facility], [address] [place], [place] [address]    RADIOLOGY:  Ct Chest Wo Contrast  Result Date: 02/18/2019 CLINICAL DATA:  Shortness of breath for the last 2 days. History of COPD on 2 L of oxygen, CHF, history of pulmonary embolism and other medical problems. Occasional dry cough. No fever. EXAM: CT CHEST WITHOUT CONTRAST TECHNIQUE: Multidetector CT imaging of the chest was performed following the standard protocol without IV contrast. COMPARISON:  Chest CTs dated 11/22/2018 and 09/21/2018. FINDINGS: Cardiovascular: Aortic atherosclerosis. No thoracic aortic aneurysm. Cardiomegaly. No pericardial effusion seen. Prominent mitral annulus calcifications again noted. Mediastinum/Nodes: No mass or enlarged lymph nodes seen within the mediastinum or perihilar regions. Esophagus is unremarkable. Trachea appears normal. Lungs/Pleura: Chronic  consolidation at the RIGHT lung base, not appreciably changed compared to CT abdomen of 12/03/2018, questionably increased compared to earlier chest CT exams. Additional mild chronic appearing atelectasis at the LEFT lung base. Lungs otherwise clear. No pleural effusion or pneumothorax seen. No consolidations or ground-glass opacities to suggest COVID-19 infection.  Upper Abdomen: Large gallstone within the gallbladder, incompletely imaged, also seen on previous exams. RIGHT renal cyst, partially imaged. No acute findings within the upper abdomen. Musculoskeletal: Degenerative change throughout the scoliotic thoracolumbar spine, moderate in degree. Advanced degenerative changes at the bilateral shoulders, incompletely imaged. No acute or suspicious osseous finding. IMPRESSION: 1. No acute findings. No evidence of active pneumonia or pulmonary edema. 2. Chronic consolidation/atelectasis at the RIGHT lung base, stable compared to CT abdomen of 12/03/2018. Mild chronic-appearing atelectasis at the LEFT lung base. 3. Cardiomegaly. No pericardial effusion. 4. Additional chronic/incidental findings detailed above. Aortic Atherosclerosis (ICD10-I70.0). Electronically Signed   By: Franki Cabot M.D.   On: 02/18/2019 14:54   Dg Chest Port 1 View  Result Date: 02/18/2019 CLINICAL DATA:  Short of breath EXAM: PORTABLE CHEST 1 VIEW COMPARISON:  11/08/2018 FINDINGS: Cardiac enlargement. Mild vascular congestion without edema. Elevated right hemidiaphragm with right lower lobe atelectasis unchanged. Left lung clear. Advanced degenerative change in both shoulders. IMPRESSION: Chronic elevation right hemidiaphragm with right lower lobe atelectasis Mild vascular congestion without edema. Electronically Signed   By: Franchot Gallo M.D.   On: 02/18/2019 12:25    EKG:   Orders placed or performed during the hospital encounter of 02/18/19  . EKG 12-Lead  . EKG 12-Lead  . EKG 12-Lead  . EKG 12-Lead      Management plans  discussed with the patient, granddaughter [name] over phone and they are in agreement.  CODE STATUS:     Code Status Orders  (From admission, onward)         Start     Ordered   02/18/19 1813  Full code  Continuous     02/18/19 1813        Code Status History    Date Active Date Inactive Code Status Order ID Comments User Context   11/08/2018 1532 11/10/2018 2032 DNR [id number]  Dustin Flock, MD Inpatient   10/28/2018 1334 10/31/2018 1621 DNR [id number]  Dustin Flock, MD Inpatient   09/27/2018 1901 09/30/2018 1545 DNR [id number]  Loletha Grayer, MD ED   09/22/2018 0133 09/25/2018 2201 Full Code [id number]  Amelia Jo, MD Inpatient   09/09/2018 1240 09/12/2018 2008 Full Code [id number]  Loletha Grayer, MD ED   08/30/2018 2018 09/01/2018 2004 DNR [id number]  Nicholes Mango, MD ED   08/30/2018 1903 08/30/2018 2018 Full Code [id number]  Nicholes Mango, MD ED   08/13/2018 1038 08/15/2018 1836 Full Code [id number]  Loletha Grayer, MD Inpatient   08/12/2018 2001 08/13/2018 1038 DNR [id number]  Gorden Harms, MD ED   08/12/2018 2001 08/12/2018 2001 Full Code [id number]  Gorden Harms, MD ED   06/15/2018 1635 06/18/2018 2009 Full Code [id number]  [name], MD Inpatient   02/24/2018 0347 02/27/2018 0027 Full Code [id number]  Amelia Jo, MD Inpatient   10/15/2017 1141 10/16/2017 2204 Full Code [id number]  Knox Royalty, NP Inpatient   10/13/2017 2246 10/15/2017 1141 DNR [id number]  Lance Coon, MD ED   10/06/2017 1454 10/09/2017 2058 DNR [id number]  Nicholes Mango, MD Inpatient   10/06/2017 1111 10/06/2017 1453 Full Code [id number]  Nicholes Mango, MD Inpatient   05/27/2017 1954 05/28/2017 2013 Full Code [id number]  Vaughan Basta, MD Inpatient   05/23/2017 2322 05/26/2017 1614 Full Code [id number]  Lance Coon, MD Inpatient   04/10/2017 0325 04/11/2017 1916 Full Code [id number]  Harrie Foreman, MD Inpatient   11/23/2016 0629 12/01/2016 1935 Full Code [id number]  Harrie Foreman, MD  Inpatient   08/26/2016 0445 08/26/2016  1923 Full Code [id number]  Saundra Shelling, MD Inpatient   09/22/2015 1755 09/25/2015 1844 Full Code [id number]  Demetrios Loll, MD ED   09/15/2015 0341 09/17/2015 1741 Full Code [id number]  Harrie Foreman, MD Inpatient   07/16/2015 0151 07/20/2015 2334 Full Code [id number]  Lance Coon, MD Inpatient   05/25/2015 0834 05/29/2015 1616 Full Code [id number]  Harrie Foreman, MD Inpatient   04/02/2015 0606 04/08/2015 1651 Full Code [id number]  Harrie Foreman, MD Inpatient      TOTAL TIME TAKING CARE OF THIS PATIENT: 43 minutes.   Note: This dictation was prepared with Dragon dictation along with smaller phrase technology. Any transcriptional errors that result from this process are unintentional.   @[facility] @  on 02/20/2019 at 12:35 PM  Between 7am to 6pm - Pager - [phone]  After 6pm go to [email or web address] - password [email or web address] [place]  Office  [phone]  CC: Primary care physician; [name], MD

## 2019-02-20 NOTE — Progress Notes (Signed)
Yolanda Wells to be D/C'd Home per MD order.  Discussed prescriptions and follow up appointments with the patient. Prescriptions given to patient, medication list explained in detail. Pt verbalized understanding.  Allergies as of 02/20/2019   No Known Allergies     Medication List    TAKE these medications   albuterol 108 (90 Base) MCG/ACT inhaler Commonly known as:  PROVENTIL HFA;VENTOLIN HFA Inhale 2 puffs into the lungs every 6 (six) hours as needed for wheezing or shortness of breath.   amLODipine 5 MG tablet Commonly known as:  NORVASC Take 2 tablets (10 mg total) by mouth daily.   aspirin EC 81 MG tablet Take 81 mg by mouth daily.   cholecalciferol 1000 units tablet Commonly known as:  VITAMIN D Take 1,000 Units by mouth daily.   docusate sodium 100 MG capsule Commonly known as:  COLACE Take 100 mg by mouth 2 (two) times daily.   Eliquis 5 MG Tabs tablet Generic drug:  apixaban TAKE ONE TABLET BY MOUTH TWICE A DAY What changed:  how much to take   estradiol 0.1 MG/GM vaginal cream Commonly known as:  ESTRACE VAGINAL Apply 0.5mg  (pea-sized amount)  just inside the vaginal introitus with a finger-tip on Monday, Wednesday and Friday nights.   folic acid 381 MCG tablet Commonly known as:  FOLVITE Take 400 mcg by mouth daily.   furosemide 20 MG tablet Commonly known as:  LASIX Take 20 mg by mouth as needed for fluid (TAKING ATLEAST ONCE A WEEK).   gabapentin 300 MG capsule Commonly known as:  NEURONTIN Take 300 mg by mouth at bedtime.   ipratropium-albuterol 0.5-2.5 (3) MG/3ML Soln Commonly known as:  DUONEB Take 3 mLs by nebulization every 4 (four) hours as needed (for shortness of breath/wheezing).   Jakafi 15 MG tablet Generic drug:  ruxolitinib phosphate TAKE 1 TABLET (15 MG TOTAL) BY MOUTH 2 TIMES DAILY.   ondansetron 4 MG disintegrating tablet Commonly known as:  Zofran ODT Take 1 tablet (4 mg total) by mouth every 8 (eight) hours as needed.    pantoprazole 40 MG tablet Commonly known as:  PROTONIX Take 1 tablet (40 mg total) by mouth daily.   polyethylene glycol 17 g packet Commonly known as:  MIRALAX / GLYCOLAX Take 17 g by mouth 4 (four) times a week. Monday, Wednesday, Saturday, Sunday   potassium chloride SA 20 MEQ tablet Commonly known as:  K-DUR,KLOR-CON Take 20 mEq by mouth as needed (ONLY TAKE WHEN TAKING LASIX).   predniSONE 10 MG (21) Tbpk tablet Commonly known as:  STERAPRED UNI-PAK 21 TAB Take 1 tablet (10 mg total) by mouth daily. Take 6 tablets by mouth for 1 day followed by  5 tablets by mouth for 1 day followed by  4 tablets by mouth for 1 day followed by  3 tablets by mouth for 1 day followed by  2 tablets by mouth for 1 day followed by  1 tablet by mouth for a day and stop What changed:    how much to take  how to take this  when to take this  additional instructions            Durable Medical Equipment  (From admission, onward)         Start     Ordered   02/20/19 1426  For home use only DME oxygen  Once    Question Answer Comment  Mode or (Route) Nasal cannula   Liters per Minute 1   Frequency  Continuous (stationary and portable oxygen unit needed)   Oxygen conserving device No   Oxygen delivery system Gas      02/20/19 1425          Vitals:   02/20/19 1227 02/20/19 1229  BP:    Pulse: 71 72  Resp:    Temp:    SpO2: (!) 86% 95%    Skin clean, dry and intact without evidence of skin break down, no evidence of skin tears noted. IV catheter discontinued intact. Site without signs and symptoms of complications. Dressing and pressure applied. Pt denies pain at this time. No complaints noted.  An After Visit Summary was printed and given to the patient. Patient escorted via [place], and D/C home via private auto.  Fuller Mandril, RN

## 2019-02-20 NOTE — Progress Notes (Signed)
Called EMS for non-emergent transport. Per operator [phone] two are ahead of patient.   Fuller Mandril, RN

## 2019-02-20 NOTE — Plan of Care (Signed)
  Problem: Clinical Measurements: Goal: Respiratory complications will improve Outcome: Progressing   

## 2019-02-20 NOTE — Progress Notes (Signed)
Patient complaining of SOB and chest pain when breathing. Administered breath treatment and elevated HOB. Encouraged patient to take deep breaths and do pursed lip breathing. O2 Sats 98 to 100%. Paged RT.   Fuller Mandril, RN

## 2019-02-20 NOTE — Progress Notes (Signed)
SATURATION QUALIFICATIONS: (This note is used to comply with regulatory documentation for home oxygen)  Patient Saturations on Room Air at Rest = 100%  Patient Saturations on Room Air while Ambulating = 86% patient is unable to ambulate.   Patient Saturations on 1 Liters of oxygen at rest =  95%  Please briefly explain why patient needs home oxygen: COPD  Fuller Mandril, RN

## 2019-02-20 NOTE — Progress Notes (Signed)
Post breathing treatment patient denise any further pain. Patient breathing stable and put patient back on 3L [place].   Fuller Mandril, RN

## 2019-02-21 ENCOUNTER — Emergency Department: Payer: Medicare Other

## 2019-02-21 ENCOUNTER — Inpatient Hospital Stay
Admission: EM | Admit: 2019-02-21 | Discharge: 2019-04-11 | DRG: 207 | Disposition: E | Payer: Medicare Other | Attending: Internal Medicine | Admitting: Internal Medicine

## 2019-02-21 ENCOUNTER — Encounter: Payer: Self-pay | Admitting: Emergency Medicine

## 2019-02-21 ENCOUNTER — Other Ambulatory Visit: Payer: Self-pay

## 2019-02-21 DIAGNOSIS — Z87440 Personal history of urinary (tract) infections: Secondary | ICD-10-CM

## 2019-02-21 DIAGNOSIS — J440 Chronic obstructive pulmonary disease with acute lower respiratory infection: Secondary | ICD-10-CM | POA: Diagnosis not present

## 2019-02-21 DIAGNOSIS — Z20828 Contact with and (suspected) exposure to other viral communicable diseases: Secondary | ICD-10-CM | POA: Diagnosis present

## 2019-02-21 DIAGNOSIS — I471 Supraventricular tachycardia: Secondary | ICD-10-CM | POA: Diagnosis not present

## 2019-02-21 DIAGNOSIS — Z7901 Long term (current) use of anticoagulants: Secondary | ICD-10-CM

## 2019-02-21 DIAGNOSIS — Z4659 Encounter for fitting and adjustment of other gastrointestinal appliance and device: Secondary | ICD-10-CM

## 2019-02-21 DIAGNOSIS — Z66 Do not resuscitate: Secondary | ICD-10-CM | POA: Diagnosis not present

## 2019-02-21 DIAGNOSIS — I110 Hypertensive heart disease with heart failure: Secondary | ICD-10-CM | POA: Diagnosis present

## 2019-02-21 DIAGNOSIS — I452 Bifascicular block: Secondary | ICD-10-CM | POA: Diagnosis present

## 2019-02-21 DIAGNOSIS — I5033 Acute on chronic diastolic (congestive) heart failure: Secondary | ICD-10-CM | POA: Diagnosis present

## 2019-02-21 DIAGNOSIS — J189 Pneumonia, unspecified organism: Secondary | ICD-10-CM | POA: Diagnosis not present

## 2019-02-21 DIAGNOSIS — R451 Restlessness and agitation: Secondary | ICD-10-CM | POA: Diagnosis not present

## 2019-02-21 DIAGNOSIS — E872 Acidosis: Secondary | ICD-10-CM | POA: Diagnosis present

## 2019-02-21 DIAGNOSIS — G9340 Encephalopathy, unspecified: Secondary | ICD-10-CM | POA: Diagnosis not present

## 2019-02-21 DIAGNOSIS — Z833 Family history of diabetes mellitus: Secondary | ICD-10-CM

## 2019-02-21 DIAGNOSIS — G9341 Metabolic encephalopathy: Secondary | ICD-10-CM | POA: Diagnosis present

## 2019-02-21 DIAGNOSIS — I69311 Memory deficit following cerebral infarction: Secondary | ICD-10-CM

## 2019-02-21 DIAGNOSIS — Z825 Family history of asthma and other chronic lower respiratory diseases: Secondary | ICD-10-CM

## 2019-02-21 DIAGNOSIS — Z6841 Body Mass Index (BMI) 40.0 and over, adult: Secondary | ICD-10-CM | POA: Diagnosis not present

## 2019-02-21 DIAGNOSIS — E876 Hypokalemia: Secondary | ICD-10-CM | POA: Diagnosis present

## 2019-02-21 DIAGNOSIS — J441 Chronic obstructive pulmonary disease with (acute) exacerbation: Secondary | ICD-10-CM | POA: Diagnosis present

## 2019-02-21 DIAGNOSIS — Z86711 Personal history of pulmonary embolism: Secondary | ICD-10-CM

## 2019-02-21 DIAGNOSIS — Z8261 Family history of arthritis: Secondary | ICD-10-CM

## 2019-02-21 DIAGNOSIS — G4733 Obstructive sleep apnea (adult) (pediatric): Secondary | ICD-10-CM | POA: Diagnosis present

## 2019-02-21 DIAGNOSIS — J9601 Acute respiratory failure with hypoxia: Secondary | ICD-10-CM

## 2019-02-21 DIAGNOSIS — Z515 Encounter for palliative care: Secondary | ICD-10-CM | POA: Diagnosis not present

## 2019-02-21 DIAGNOSIS — D751 Secondary polycythemia: Secondary | ICD-10-CM | POA: Diagnosis present

## 2019-02-21 DIAGNOSIS — F0150 Vascular dementia without behavioral disturbance: Secondary | ICD-10-CM | POA: Diagnosis present

## 2019-02-21 DIAGNOSIS — J9602 Acute respiratory failure with hypercapnia: Secondary | ICD-10-CM

## 2019-02-21 DIAGNOSIS — Z96643 Presence of artificial hip joint, bilateral: Secondary | ICD-10-CM | POA: Diagnosis present

## 2019-02-21 DIAGNOSIS — Y95 Nosocomial condition: Secondary | ICD-10-CM | POA: Diagnosis not present

## 2019-02-21 DIAGNOSIS — Z86718 Personal history of other venous thrombosis and embolism: Secondary | ICD-10-CM | POA: Diagnosis not present

## 2019-02-21 DIAGNOSIS — I480 Paroxysmal atrial fibrillation: Secondary | ICD-10-CM | POA: Diagnosis present

## 2019-02-21 DIAGNOSIS — R4182 Altered mental status, unspecified: Secondary | ICD-10-CM | POA: Diagnosis present

## 2019-02-21 DIAGNOSIS — D469 Myelodysplastic syndrome, unspecified: Secondary | ICD-10-CM | POA: Diagnosis present

## 2019-02-21 DIAGNOSIS — R8281 Pyuria: Secondary | ICD-10-CM | POA: Diagnosis not present

## 2019-02-21 DIAGNOSIS — J9621 Acute and chronic respiratory failure with hypoxia: Principal | ICD-10-CM | POA: Diagnosis present

## 2019-02-21 DIAGNOSIS — Z933 Colostomy status: Secondary | ICD-10-CM

## 2019-02-21 DIAGNOSIS — Z808 Family history of malignant neoplasm of other organs or systems: Secondary | ICD-10-CM

## 2019-02-21 DIAGNOSIS — J9622 Acute and chronic respiratory failure with hypercapnia: Secondary | ICD-10-CM | POA: Diagnosis present

## 2019-02-21 DIAGNOSIS — Z8249 Family history of ischemic heart disease and other diseases of the circulatory system: Secondary | ICD-10-CM

## 2019-02-21 DIAGNOSIS — M1990 Unspecified osteoarthritis, unspecified site: Secondary | ICD-10-CM | POA: Diagnosis present

## 2019-02-21 DIAGNOSIS — D638 Anemia in other chronic diseases classified elsewhere: Secondary | ICD-10-CM | POA: Diagnosis present

## 2019-02-21 DIAGNOSIS — J9600 Acute respiratory failure, unspecified whether with hypoxia or hypercapnia: Secondary | ICD-10-CM

## 2019-02-21 DIAGNOSIS — Z7982 Long term (current) use of aspirin: Secondary | ICD-10-CM

## 2019-02-21 DIAGNOSIS — Z7189 Other specified counseling: Secondary | ICD-10-CM | POA: Diagnosis not present

## 2019-02-21 DIAGNOSIS — N390 Urinary tract infection, site not specified: Secondary | ICD-10-CM | POA: Diagnosis not present

## 2019-02-21 DIAGNOSIS — R509 Fever, unspecified: Secondary | ICD-10-CM

## 2019-02-21 DIAGNOSIS — J9620 Acute and chronic respiratory failure, unspecified whether with hypoxia or hypercapnia: Secondary | ICD-10-CM | POA: Diagnosis present

## 2019-02-21 DIAGNOSIS — D7581 Myelofibrosis: Secondary | ICD-10-CM | POA: Diagnosis present

## 2019-02-21 DIAGNOSIS — R410 Disorientation, unspecified: Secondary | ICD-10-CM | POA: Diagnosis not present

## 2019-02-21 DIAGNOSIS — K5731 Diverticulosis of large intestine without perforation or abscess with bleeding: Secondary | ICD-10-CM

## 2019-02-21 DIAGNOSIS — E860 Dehydration: Secondary | ICD-10-CM | POA: Diagnosis present

## 2019-02-21 DIAGNOSIS — I272 Pulmonary hypertension, unspecified: Secondary | ICD-10-CM | POA: Diagnosis present

## 2019-02-21 DIAGNOSIS — Z01818 Encounter for other preprocedural examination: Secondary | ICD-10-CM

## 2019-02-21 DIAGNOSIS — J449 Chronic obstructive pulmonary disease, unspecified: Secondary | ICD-10-CM | POA: Diagnosis not present

## 2019-02-21 LAB — COMPREHENSIVE METABOLIC PANEL
ALT: 10 U/L (ref 0–44)
AST: 17 U/L (ref 15–41)
Albumin: 3.1 g/dL — ABNORMAL LOW (ref 3.5–5.0)
Alkaline Phosphatase: 66 U/L (ref 38–126)
Anion gap: 4 — ABNORMAL LOW (ref 5–15)
BUN: 17 mg/dL (ref 8–23)
CO2: 33 mmol/L — ABNORMAL HIGH (ref 22–32)
Calcium: 8.4 mg/dL — ABNORMAL LOW (ref 8.9–10.3)
Chloride: 105 mmol/L (ref 98–111)
Creatinine, Ser: 0.48 mg/dL (ref 0.44–1.00)
GFR calc Af Amer: >60 mL/min (ref >60)
GFR calc non Af Amer: >60 mL/min (ref >60)
Glucose, Bld: 120 mg/dL — ABNORMAL HIGH (ref 70–99)
Potassium: 3.9 mmol/L (ref 3.5–5.1)
Sodium: 142 mmol/L (ref 135–145)
Total Bilirubin: 0.9 mg/dL (ref 0.3–1.2)
Total Protein: 7.5 g/dL (ref 6.5–8.1)

## 2019-02-21 LAB — URINALYSIS, COMPLETE (UACMP) WITH MICROSCOPIC
Bilirubin Urine: NEGATIVE
Glucose, UA: NEGATIVE mg/dL
Ketones, ur: NEGATIVE mg/dL
Leukocytes,Ua: NEGATIVE
Nitrite: NEGATIVE
Protein, ur: NEGATIVE mg/dL
Specific Gravity, Urine: 1.005 (ref 1.005–1.030)
pH: 5 (ref 5.0–8.0)

## 2019-02-21 LAB — BLOOD GAS, ARTERIAL
Acid-Base Excess: 12.9 mmol/L — ABNORMAL HIGH (ref 0.0–2.0)
Bicarbonate: 41.6 mmol/L — ABNORMAL HIGH (ref 20.0–28.0)
Delivery systems: POSITIVE
Expiratory PAP: 8
FIO2: 0.35
MECHVT: 460 mL
Mode: UNDETERMINED
O2 Saturation: 97.5 %
Patient temperature: 37
RATE: 8 resp/min
pCO2 arterial: 72 mmHg (ref 32.0–48.0)
pH, Arterial: 7.37 (ref 7.350–7.450)
pO2, Arterial: 99 mmHg (ref 83.0–108.0)

## 2019-02-21 LAB — GLUCOSE, CAPILLARY
Glucose-Capillary: 100 mg/dL — ABNORMAL HIGH (ref 70–99)
Glucose-Capillary: 117 mg/dL — ABNORMAL HIGH (ref 70–99)

## 2019-02-21 LAB — CBC WITH DIFFERENTIAL/PLATELET
Abs Immature Granulocytes: 0.2 10*3/uL — ABNORMAL HIGH (ref 0.00–0.07)
Band Neutrophils: 6 %
Basophils Absolute: 0 10*3/uL (ref 0.0–0.1)
Basophils Relative: 0 %
Eosinophils Absolute: 0 10*3/uL (ref 0.0–0.5)
Eosinophils Relative: 0 %
HCT: 35 % — ABNORMAL LOW (ref 36.0–46.0)
Hemoglobin: 9.5 g/dL — ABNORMAL LOW (ref 12.0–15.0)
Lymphocytes Relative: 7 %
Lymphs Abs: 1.1 10*3/uL (ref 0.7–4.0)
MCH: 23.2 pg — ABNORMAL LOW (ref 26.0–34.0)
MCHC: 27.1 g/dL — ABNORMAL LOW (ref 30.0–36.0)
MCV: 85.4 fL (ref 80.0–100.0)
Metamyelocytes Relative: 1 %
Monocytes Absolute: 0.3 10*3/uL (ref 0.1–1.0)
Monocytes Relative: 2 %
Neutro Abs: 14.6 10*3/uL — ABNORMAL HIGH (ref 1.7–7.7)
Neutrophils Relative %: 84 %
Platelets: 279 10*3/uL (ref 150–400)
RBC: 4.1 MIL/uL (ref 3.87–5.11)
RDW: 31.2 % — ABNORMAL HIGH (ref 11.5–15.5)
WBC: 16.2 10*3/uL — ABNORMAL HIGH (ref 4.0–10.5)
nRBC: 3.2 % — ABNORMAL HIGH (ref 0.0–0.2)
nRBC: 5 /100 WBC — ABNORMAL HIGH

## 2019-02-21 LAB — BLOOD GAS, VENOUS
Acid-Base Excess: 10 mmol/L — ABNORMAL HIGH (ref 0.0–2.0)
Bicarbonate: 40.4 mmol/L — ABNORMAL HIGH (ref 20.0–28.0)
O2 Saturation: 45.9 %
Patient temperature: 37
pCO2, Ven: 86 mmHg (ref 44.0–60.0)
pH, Ven: 7.28 (ref 7.250–7.430)
pO2, Ven: <31 mmHg — CL (ref 32.0–45.0)

## 2019-02-21 LAB — MRSA PCR SCREENING: MRSA by PCR: POSITIVE — AB

## 2019-02-21 LAB — TROPONIN I: Troponin I: 0.09 ng/mL (ref <0.03)

## 2019-02-21 MED ORDER — IPRATROPIUM-ALBUTEROL 0.5-2.5 (3) MG/3ML IN SOLN
3.0000 mL | Freq: Four times a day (QID) | RESPIRATORY_TRACT | Status: DC
  Administered 2019-02-21 – 2019-02-22 (×2): 3 mL via RESPIRATORY_TRACT
  Filled 2019-02-21 (×2): qty 3

## 2019-02-21 MED ORDER — CHLORHEXIDINE GLUCONATE CLOTH 2 % EX PADS
6.0000 | MEDICATED_PAD | Freq: Every day | CUTANEOUS | Status: AC
  Administered 2019-02-22 – 2019-02-26 (×3): 6 via TOPICAL

## 2019-02-21 MED ORDER — MUPIROCIN 2 % EX OINT
1.0000 "application " | TOPICAL_OINTMENT | Freq: Two times a day (BID) | CUTANEOUS | Status: AC
  Administered 2019-02-21 – 2019-02-26 (×9): 1 via NASAL
  Filled 2019-02-21 (×2): qty 22

## 2019-02-21 MED ORDER — ENOXAPARIN SODIUM 40 MG/0.4ML ~~LOC~~ SOLN
40.0000 mg | SUBCUTANEOUS | Status: DC
  Administered 2019-02-21 – 2019-02-28 (×8): 40 mg via SUBCUTANEOUS
  Filled 2019-02-21 (×8): qty 0.4

## 2019-02-21 MED ORDER — ORAL CARE MOUTH RINSE
15.0000 mL | Freq: Two times a day (BID) | OROMUCOSAL | Status: DC
  Administered 2019-02-22 – 2019-03-01 (×9): 15 mL via OROMUCOSAL

## 2019-02-21 MED ORDER — ONDANSETRON HCL 4 MG PO TABS: 4.0000 mg | ORAL_TABLET | Freq: Four times a day (QID) | ORAL | Status: DC | PRN

## 2019-02-21 MED ORDER — POLYETHYLENE GLYCOL 3350 17 G PO PACK: 17.0000 g | PACK | Freq: Every day | ORAL | Status: DC | PRN

## 2019-02-21 MED ORDER — CHLORHEXIDINE GLUCONATE 0.12 % MT SOLN
15.0000 mL | Freq: Two times a day (BID) | OROMUCOSAL | Status: DC
  Administered 2019-02-21 – 2019-02-27 (×10): 15 mL via OROMUCOSAL
  Filled 2019-02-21 (×8): qty 15

## 2019-02-21 MED ORDER — FUROSEMIDE 10 MG/ML IJ SOLN
60.0000 mg | Freq: Once | INTRAMUSCULAR | Status: AC
  Administered 2019-02-21: 60 mg via INTRAVENOUS
  Filled 2019-02-21: qty 8

## 2019-02-21 MED ORDER — DEXMEDETOMIDINE HCL IN NACL 400 MCG/100ML IV SOLN
0.4000 ug/kg/h | INTRAVENOUS | Status: DC
  Administered 2019-02-21: 0.6 ug/kg/h via INTRAVENOUS
  Administered 2019-02-21: 17:00:00 0.4 ug/kg/h via INTRAVENOUS
  Administered 2019-02-22: 0.6 ug/kg/h via INTRAVENOUS
  Filled 2019-02-21 (×3): qty 100

## 2019-02-21 MED ORDER — ONDANSETRON HCL 4 MG/2ML IJ SOLN: 4.0000 mg | Freq: Four times a day (QID) | INTRAMUSCULAR | Status: DC | PRN

## 2019-02-21 MED ORDER — ACETAMINOPHEN 650 MG RE SUPP
650.0000 mg | Freq: Four times a day (QID) | RECTAL | Status: DC | PRN
  Administered 2019-03-15: 650 mg via RECTAL
  Filled 2019-02-21: qty 1

## 2019-02-21 MED ORDER — SODIUM CHLORIDE 0.9% FLUSH
3.0000 mL | Freq: Two times a day (BID) | INTRAVENOUS | Status: DC
  Administered 2019-02-21 – 2019-03-15 (×39): 3 mL via INTRAVENOUS

## 2019-02-21 MED ORDER — ALBUTEROL SULFATE (2.5 MG/3ML) 0.083% IN NEBU: 2.5000 mg | INHALATION_SOLUTION | RESPIRATORY_TRACT | Status: DC | PRN

## 2019-02-21 MED ORDER — ACETAMINOPHEN 325 MG PO TABS
650.0000 mg | ORAL_TABLET | Freq: Four times a day (QID) | ORAL | Status: DC | PRN
  Administered 2019-03-01: 650 mg via ORAL
  Filled 2019-02-21 (×2): qty 2

## 2019-02-21 NOTE — ED Notes (Signed)
Patient transported to CT 

## 2019-02-21 NOTE — Progress Notes (Signed)
Pt transferred to CCU on BIPAP with no issues. Will continue to monitor.

## 2019-02-21 NOTE — Consult Note (Signed)
Reason for Consult Acute on chronic respiratory failure Referring Physician: [name] is an 85 y.o. female.  HPI: Patient is an 85 year old African-American woman, lifelong never smoker, with a history of polycythemia vera, pulmonary embolism and DVT in the past who presents with acute on chronic respiratory failure.  The patient is known to have chronic hypoxic respiratory failure and requires 2 L of oxygen at baseline.  She has a history of diastolic dysfunction.  The patient cannot provide history but she was noted to have altered mental status by her family and was brought in because of the altered mental status, tachypnea.  Patient had a dry nonproductive cough but this was present on her most recent hospitalization.  Note is made that she was discharged yesterday.  No further history can be gleaned.  No family available to interview and the patient cannot provide history as noted.  She is currently on BiPAP and was brought to stepdown unit because of this.  And follows with Dr. Raul Wells for her pulmonary issues.  She follows up at [facility] for her issues of polycythemia and hypercoagulability.  Her most recent FEV1 was noted to be in 2019 was 0.8 L.  As noted she is chronically on 2 L/min nasal cannula O2.  Prior echocardiograms have suggested pulmonary hypertension and increased right sided pressures and size.  A CT scan of the chest was performed on 10 April that showed chronic consolidation/atelectasis of the right lung base and chronic appearing atelectasis on the left lung base.  She has not had any evidence of recurrent pulmonary embolism.  Chest x-ray did not show anything active currently.  CT head did not show anything to explain her altered mental status.  Arterial blood gas shows hypercarbia with a pH of 7.37 and a PaO2 of 99 on AVAPS non invasive mode.  Per report the patient has had no fever at home and has had no sick contacts.  As noted she was just discharged  yesterday.  Past Medical History:  Diagnosis Date  . Acute deep vein thrombosis (DVT) of distal vein of left lower extremity ([place]) 10/03/2015  . Acute pulmonary embolism ([place]) 10/03/2015  . Anticoagulant long-term use   . Arthritis   . Arthritis   . CHF (congestive heart failure) ([place])   . Chronic anticoagulation 10/03/2015  . Collagen vascular disease ([place])   . Colostomy in place [facility]) 10/04/2015  . Deep venous thrombosis ([facility])    right lower extremity  . Dependent edema   . Diverticulosis of intestine with bleeding 04/02/2015  . Glenohumeral arthritis 06/28/2012  . Hammertoe 09/02/2012  . History of bilateral hip replacements 09/02/2012  . History of hysterectomy   . Hypertension   . Hypertension   . Hypokalemia   . Inability to ambulate due to hip 10/12/2015  . Mandibular dysfunction   . Onychomycosis 09/02/2012  . Osteoarthritis   . Polycythemia vera ([place])   . Presence of IVC filter 05/25/2015  . Stroke ([place]) 03/26/2015   cerebellar  . Urinary incontinence in female 09/02/2016    Past Surgical History:  Procedure Laterality Date  . ABDOMINAL HYSTERECTOMY    . BLEPHAROPLASTY Right 03/2017   right upper eyelid  . CARDIAC CATHETERIZATION Right 04/03/2015   Procedure: CENTRAL LINE INSERTION;  Surgeon: Sherri Rad, MD;  Location: [facility] ORS;  Service: General;  Laterality: Right;  . COLECTOMY WITH COLOSTOMY CREATION/HARTMANN PROCEDURE N/A 04/03/2015   Procedure: COLECTOMY WITH COLOSTOMY CREATION/HARTMANN PROCEDURE;  Surgeon: Sherri Rad, MD;  Location[facility]  ORS;  Service: General;  Laterality: N/A;  . COLON SURGERY    . FOOT AMPUTATION     partial  . FOOT AMPUTATION Right   . IVC FILTER INSERTION    . JOINT REPLACEMENT     Left and Right Hip  . PERIPHERAL VASCULAR CATHETERIZATION N/A 04/02/2015   Procedure: Visceral Angiography;  Surgeon: Algernon Huxley, MD;  Location: [place] CV LAB;  Service: Cardiovascular;  Laterality: N/A;  . PERIPHERAL VASCULAR CATHETERIZATION N/A  04/02/2015   Procedure: Visceral Artery Intervention;  Surgeon: Algernon Huxley, MD;  Location: [place] CV LAB;  Service: Cardiovascular;  Laterality: N/A;  . PERIPHERAL VASCULAR CATHETERIZATION N/A 05/25/2015   Procedure: IVC Filter Insertion;  Surgeon: Katha Cabal, MD;  Location: [place] CV LAB;  Service: Cardiovascular;  Laterality: N/A;  . TOE AMPUTATION     right  . TOTAL HIP ARTHROPLASTY      Family History  Problem Relation Age of Onset  . Brain cancer Mother   . Heart attack Father   . COPD Father   . Coronary artery disease Father   . Hypertension Other   . Arthritis-Osteo Other   . Diabetes Brother   . Arthritis Brother   . Osteosarcoma Son   . Bone cancer Son   . Arthritis Sister   . Arthritis Brother   . Hypertension Brother   . Coronary artery disease Brother   . Malignant hyperthermia Brother     Social History:  reports that she has never smoked. She has never used smokeless tobacco. She reports that she does not drink alcohol or use drugs.  Allergies: No Known Allergies  Medications:  I have reviewed the patient's current medications. Prior to Admission:  Medications Prior to Admission  Medication Sig Dispense Refill Last Dose  . amLODipine (NORVASC) 5 MG tablet Take 2 tablets (10 mg total) by mouth daily. 30 tablet 1 Past Week at Unknown time  . aspirin EC 81 MG tablet Take 81 mg by mouth daily.   Past Week at Unknown time  . cholecalciferol (VITAMIN D) 1000 units tablet Take 1,000 Units by mouth daily.   Past Week at Unknown time  . ELIQUIS 5 MG TABS tablet TAKE ONE TABLET BY MOUTH TWICE A DAY (Patient taking differently: Take 5 mg by mouth 2 (two) times daily. ) 120 tablet 0 Past Week at Unknown time  . estradiol (ESTRACE VAGINAL) 0.1 MG/GM vaginal cream Apply 0.5mg  (pea-sized amount)  just inside the vaginal introitus with a finger-tip on Monday, Wednesday and Friday nights. 30 g 12 Past Week at Unknown time  . folic acid (FOLVITE) 948 MCG  tablet Take 400 mcg by mouth daily.   Past Week at Unknown time  . gabapentin (NEURONTIN) 300 MG capsule Take 300 mg by mouth at bedtime.   Past Week at Unknown time  . ipratropium-albuterol (DUONEB) 0.5-2.5 (3) MG/3ML SOLN Take 3 mLs by nebulization every 4 (four) hours as needed (for shortness of breath/wheezing). 360 mL 0 Past Week at Unknown time  . JAKAFI 15 MG tablet TAKE 1 TABLET (15 MG TOTAL) BY MOUTH 2 TIMES DAILY. 60 tablet 1 Past Week at Unknown time  . pantoprazole (PROTONIX) 40 MG tablet Take 1 tablet (40 mg total) by mouth daily. 30 tablet 0 Past Week at Unknown time  . albuterol (PROVENTIL HFA;VENTOLIN HFA) 108 (90 Base) MCG/ACT inhaler Inhale 2 puffs into the lungs every 6 (six) hours as needed for wheezing or shortness of breath. 1 Inhaler  1 prn at prn  . docusate sodium (COLACE) 100 MG capsule Take 100 mg by mouth 2 (two) times daily.   prn at prn  . furosemide (LASIX) 20 MG tablet Take 20 mg by mouth as needed for fluid (TAKING ATLEAST ONCE A WEEK).    prn at prn  . ondansetron (ZOFRAN ODT) 4 MG disintegrating tablet Take 1 tablet (4 mg total) by mouth every 8 (eight) hours as needed. 20 tablet 0 prn at prn  . polyethylene glycol (MIRALAX / GLYCOLAX) 17 g packet Take 17 g by mouth 4 (four) times a week. Monday, Wednesday, Saturday, Sunday   prn at prn  . potassium chloride SA (K-DUR,KLOR-CON) 20 MEQ tablet Take 20 mEq by mouth as needed (ONLY TAKE WHEN TAKING LASIX).   prn at prn  . predniSONE (STERAPRED UNI-PAK 21 TAB) 10 MG (21) TBPK tablet Take 1 tablet (10 mg total) by mouth daily. Take 6 tablets by mouth for 1 day followed by  5 tablets by mouth for 1 day followed by  4 tablets by mouth for 1 day followed by  3 tablets by mouth for 1 day followed by  2 tablets by mouth for 1 day followed by  1 tablet by mouth for a day and stop 21 tablet 0     Results for orders placed or performed during the hospital encounter of 02/12/2019 (from the past 48 hour(s))  Blood gas, venous      Status: Abnormal   Collection Time: 03/02/2019 11:53 AM  Result Value Ref Range   pH, Ven 7.28 7.250 - 7.430   pCO2, Ven 86 (HH) 44.0 - 60.0 mmHg    Comment: CRITICAL RESULT CALLED TO, READ BACK BY AND VERIFIED WITH:  RN BUCKNUM AT 1220, 02/20/2019, LS    pO2, Ven <31.0 (LL) 32.0 - 45.0 mmHg   Bicarbonate 40.4 (H) 20.0 - 28.0 mmol/L   Acid-Base Excess 10.0 (H) 0.0 - 2.0 mmol/L   O2 Saturation 45.9 %   Patient temperature 37.0    Collection site VENOUS    Sample type VENOUS     Comment: Performed at [facility], [address] [place], [place] [address]  CBC with Differential     Status: Abnormal   Collection Time: 02/19/2019 12:06 PM  Result Value Ref Range   WBC 16.2 (H) 4.0 - 10.5 K/uL   RBC 4.10 3.87 - 5.11 MIL/uL   Hemoglobin 9.5 (L) 12.0 - 15.0 g/dL   HCT 35.0 (L) 36.0 - 46.0 %   MCV 85.4 80.0 - 100.0 fL   MCH 23.2 (L) 26.0 - 34.0 pg   MCHC 27.1 (L) 30.0 - 36.0 g/dL   RDW 31.2 (H) 11.5 - 15.5 %   Platelets 279 150 - 400 K/uL   nRBC 3.2 (H) 0.0 - 0.2 %   Neutrophils Relative % 84 %   Neutro Abs 14.6 (H) 1.7 - 7.7 K/uL   Band Neutrophils 6 %   Lymphocytes Relative 7 %   Lymphs Abs 1.1 0.7 - 4.0 K/uL   Monocytes Relative 2 %   Monocytes Absolute 0.3 0.1 - 1.0 K/uL   Eosinophils Relative 0 %   Eosinophils Absolute 0.0 0.0 - 0.5 K/uL   Basophils Relative 0 %   Basophils Absolute 0.0 0.0 - 0.1 K/uL   RBC Morphology MIXED RBC POPULATION    nRBC 5 (H) 0 /100 WBC   Metamyelocytes Relative 1 %   Abs Immature Granulocytes 0.20 (H) 0.00 - 0.07 K/uL  Polychromasia PRESENT    Spherocytes PRESENT    Giant PLTs PRESENT     Comment: Performed at [facility], [address] [place], [place] [address]  Comprehensive metabolic panel     Status: Abnormal   Collection Time: 02/22/2019 12:06 PM  Result Value Ref Range   Sodium 142 135 - 145 mmol/L   Potassium 3.9 3.5 - 5.1 mmol/L   Chloride 105 98 - 111 mmol/L   CO2 33 (H) 22 - 32 mmol/L   Glucose, Bld 120 (H)  70 - 99 mg/dL   BUN 17 8 - 23 mg/dL   Creatinine, Ser 0.48 0.44 - 1.00 mg/dL   Calcium 8.4 (L) 8.9 - 10.3 mg/dL   Total Protein 7.5 6.5 - 8.1 g/dL   Albumin 3.1 (L) 3.5 - 5.0 g/dL   AST 17 15 - 41 U/L   ALT 10 0 - 44 U/L   Alkaline Phosphatase 66 38 - 126 U/L   Total Bilirubin 0.9 0.3 - 1.2 mg/dL   GFR calc non Af Amer >60 >60 mL/min   GFR calc Af Amer >60 >60 mL/min   Anion gap 4 (L) 5 - 15    Comment: Performed at [facility], [address] [place], [place] [address]  Troponin I - ONCE - STAT     Status: Abnormal   Collection Time: 02/17/2019 12:06 PM  Result Value Ref Range   Troponin I 0.09 (HH) <0.03 ng/mL    Comment: CRITICAL RESULT CALLED TO, READ BACK BY AND VERIFIED WITH KATE BUCKNUM AT 1242 ON 02/27/2019 JJB Performed at [facility], [address] [place], [place] [address]   Glucose, capillary     Status: Abnormal   Collection Time: 03/02/2019 12:21 PM  Result Value Ref Range   Glucose-Capillary 100 (H) 70 - 99 mg/dL   Comment 1 Document in Chart   Glucose, capillary     Status: Abnormal   Collection Time: 02/15/2019  3:10 PM  Result Value Ref Range   Glucose-Capillary 117 (H) 70 - 99 mg/dL  Urinalysis, Complete w Microscopic     Status: Abnormal   Collection Time: 02/22/2019  3:36 PM  Result Value Ref Range   Color, Urine STRAW (A) YELLOW   APPearance HAZY (A) CLEAR   Specific Gravity, Urine 1.005 1.005 - 1.030   pH 5.0 5.0 - 8.0   Glucose, UA NEGATIVE NEGATIVE mg/dL   Hgb urine dipstick SMALL (A) NEGATIVE   Bilirubin Urine NEGATIVE NEGATIVE   Ketones, ur NEGATIVE NEGATIVE mg/dL   Protein, ur NEGATIVE NEGATIVE mg/dL   Nitrite NEGATIVE NEGATIVE   Leukocytes,Ua NEGATIVE NEGATIVE   RBC / HPF 6-10 0 - 5 RBC/hpf   WBC, UA 0-5 0 - 5 WBC/hpf   Bacteria, UA RARE (A) NONE SEEN   Squamous Epithelial / LPF 0-5 0 - 5   Mucus PRESENT    Hyaline Casts, UA PRESENT    Cellular Cast, UA PRESENT     Comment: Performed at [facility], [address] [place], [place] [address]  MRSA PCR Screening     Status: Abnormal   Collection Time: 02/23/2019  3:40 PM  Result Value Ref Range   MRSA by PCR POSITIVE (A) NEGATIVE    Comment:        The GeneXpert MRSA Assay (FDA approved for NASAL specimens only), is one component of a comprehensive MRSA colonization surveillance program. It is not intended to diagnose MRSA infection nor to guide or monitor  treatment for MRSA infections. RESULT CALLED TO, READ BACK BY AND VERIFIED WITH: C GREEN 03/04/2019 AT 1656 BY [facility] Performed at [facility], [address] [place], [place] [address]   Blood gas, arterial     Status: Abnormal   Collection Time: 02/13/2019  4:44 PM  Result Value Ref Range   FIO2 0.35    Delivery systems BILEVEL POSITIVE AIRWAY PRESSURE    Mode UNABLE TO CALCULATE.     Comment: AVAPS   VT 460 mL   LHR 8 resp/min   Expiratory PAP 8     Comment: 8   pH, Arterial 7.37 7.350 - 7.450   pCO2 arterial 72 (HH) 32.0 - 48.0 mmHg    Comment: CRITICAL RESULT CALLED TO, READ BACK BY AND VERIFIED WITH:  Eulis Manly RN @ 4818 ON 02/20/2019 BY SN[facility]    pO2, Arterial 99 83.0 - 108.0 mmHg   Bicarbonate 41.6 (H) 20.0 - 28.0 mmol/L   Acid-Base Excess 12.9 (H) 0.0 - 2.0 mmol/L   O2 Saturation 97.5 %   Patient temperature 37.0    Collection site RIGHT RADIAL    Sample type ARTERIAL DRAW    Allens test (pass/fail) PASS PASS    Comment: Performed at [facility], [address] [place], [place] [address]    Dg Chest 1 View  Result Date: 03/06/2019 CLINICAL DATA:  Acute shortness of breath and hypoxia. EXAM: CHEST  1 VIEW COMPARISON:  02/18/2019 chest radiograph, chest CT and prior studies FINDINGS: Cardiomediastinal silhouette is unchanged. RIGHT basilar atelectasis again noted. There is no evidence of focal airspace disease, pulmonary edema, suspicious pulmonary nodule/mass, pleural effusion, or pneumothorax. No acute bony abnormalities are  identified. IMPRESSION: RIGHT basilar atelectasis. Electronically Signed   By: Margarette Canada M.D.   On: 02/20/2019 13:39   Ct Head Wo Contrast  Result Date: 03/03/2019 CLINICAL DATA:  85 year old female with altered mental status. EXAM: CT HEAD WITHOUT CONTRAST TECHNIQUE: Contiguous axial images were obtained from the base of the skull through the vertex without intravenous contrast. COMPARISON:  08/30/2018 MR, 08/30/2018 CT and prior studies FINDINGS: Brain: No evidence of acute infarction, hemorrhage, hydrocephalus, extra-axial collection or mass lesion/mass effect. Atrophy and mild chronic small-vessel white matter ischemic changes noted. Vascular: Atherosclerotic calcifications again noted. Skull: Normal. Negative for fracture or focal lesion. Sinuses/Orbits: No acute finding. Other: None. IMPRESSION: 1. No evidence of acute intracranial abnormality. 2. Atrophy and mild chronic small-vessel white matter ischemic changes. Electronically Signed   By: Margarette Canada M.D.   On: 03/03/2019 12:38    Review of Systems  Unable to perform ROS: Mental status change   Blood pressure 136/86, pulse 91, temperature 97.9 F (36.6 C), temperature source Axillary, resp. rate 19, height 5\' 2"  (1.575 m), weight 88 kg, SpO2 100 %. Physical Exam  Constitutional: She is uncooperative.  Obese, confused, pulls BiPAP mask off  HENT:  Head: Normocephalic and atraumatic.  Eyes: Pupils are equal, round, and reactive to light. Conjunctivae are normal.  Neck: Neck supple. No tracheal deviation present.  Neck veins full  Cardiovascular: Normal rate, regular rhythm, S1 normal, S2 normal and normal pulses.  Murmur heard.  Systolic murmur is present with a grade of 2/6. Respiratory: Tachypnea noted. She is in respiratory distress. She has decreased breath sounds in the right lower field and the left lower field. She has no wheezes. She has no rhonchi. She has no rales.  Distant breath sounds  GI: Bowel sounds are normal. She  exhibits no distension. There is no abdominal tenderness. There is no rebound.  Protruberant,colostomy in place  Musculoskeletal: Normal range of motion.        General: No edema.  Neurological: She has normal strength. She is disoriented. No cranial nerve deficit.  Skin: Skin is warm and dry.  Psychiatric:  Patient is disoriented cannot assess.    Assessment/Plan:  1.  Acute on chronic respiratory failure with both hypoxia and hypercarbia: Patient is labeled as COPD but has no history of prior tobacco use.  No bronchospasm on exam.  Continue AVAPS ventilation.  Maintain oxygen saturations 90% or better.  I suspect that most of her issues are related to pulmonary hypertension which is likely multifactorial in etiology.  She also has issues with diastolic dysfunction and decompensation of this may be a possibility.  No evidence of active infectious process at present.  2.  Altered mental status due to metabolic encephalopathy likely triggered by hypercarbia and hypoxia.  Continue APAPS, keep oxygen saturation at 90% or better.  She has been quite agitated and pulling a AVAPS mask off initiated Precedex.  Was pulling the mask off even with mitts on restrain on till mental status improves as she is interfering with therapy.  3.  Pulmonary hypertension: This needs to be reassessed.  Will obtain 2D echo.  4.  Anemia: Patient had issues with polycythemia vera previously however currently is anemic.  This issue adds to her respiratory failure that decreases her oxygen carrying capacity.  She has no evidence of bleeding.  Consider anemia profile.  5.  History of COPD,no evidence of exacerbation,.  By her last pulmonary function testing performed by Dr. Raul Wells it appears that she has a combined obstructive and restrictive physiology.  Obstructive physiology could be secondary to chronic obstructive asthma and/or bronchiectasis.  She is a lifelong never smoker.  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   Nebulization treatments  6.  The patient has been under hospice/ palliative care in the past.  This has been rescinded by the family and she is now full code again.  She has multiple end-stage comorbidities, advanced age and deconditioning. DNR status would be appropriate.Palliative care consult obtained to re establish [place].  7.Prophylaxis: continue anticoag.  8. Colostomy due to bout of  ischemic bowel in 2016.  Colostomy appears to be functioning well.    Renold Don, MD [facility] 02/22/2019, 9:35 PM   This chart was dictated using voice recognition software/Dragon.  Despite best efforts to proofread, errors can occur which can change the meaning.  Any change was purely unintentional.

## 2019-02-21 NOTE — ED Notes (Signed)
Date and time results received: 02/10/2019 1225 (use smartphrase ".now" to insert current time)  Test: VBG Critical Value: CO2 86; pO2 <31  Name of Provider Notified: Jimmye Norman  Orders Received? Or Actions Taken?: Orders Received - See Orders for details

## 2019-02-21 NOTE — ED Notes (Signed)
Patient placed on BiPAP 

## 2019-02-21 NOTE — ED Notes (Signed)
ED TO INPATIENT HANDOFF REPORT  ED Nurse Name and Phone #: Anda Kraft [phone]  S Name/Age/Gender Yolanda Wells 85 y.o. female Room/Bed: ED03A/ED03A  Code Status   Code Status: Full Code  Home/SNF/Other Home Patient oriented to: self Is this baseline? No   Triage Complete: Triage complete  Chief Complaint ams  Triage Note Patient arrives via EMS after being discharged from [facility] last night for COPD exacerbation. Patient's granddaughter reports patient has had altered mental status since being discharged last night around 8pm, reports patient has been slurring words.   Allergies No Known Allergies  Level of Care/Admitting Diagnosis ED Disposition    ED Disposition Condition [facility] Area: [place] [[id number]]  Level of Care: Stepdown [14]  Diagnosis: Acute on chronic respiratory failure [facility]) [[id number]]  Admitting Physician: Hillary Bow [[id number]]  Attending Physician: Hillary Bow [[id number]]  Estimated length of stay: past midnight tomorrow  Certification:: I certify this patient will need inpatient services for at least 2 midnights  PT Class (Do Not Modify): Inpatient [101]  PT Acc Code (Do Not Modify): Private [1]       B Medical/Surgery History Past Medical History:  Diagnosis Date  . Acute deep vein thrombosis (DVT) of distal vein of left lower extremity ([place]) 10/03/2015  . Acute pulmonary embolism ([place]) 10/03/2015  . Anticoagulant long-term use   . Arthritis   . Arthritis   . CHF (congestive heart failure) ([place])   . Chronic anticoagulation 10/03/2015  . Collagen vascular disease ([place])   . Colostomy in place [facility]) 10/04/2015  . Deep venous thrombosis ([facility])    right lower extremity  . Dependent edema   . Diverticulosis of intestine with bleeding 04/02/2015  . Glenohumeral arthritis 06/28/2012  . Hammertoe 09/02/2012  . History of bilateral hip replacements 09/02/2012  . History of hysterectomy   . Hypertension   .  Hypertension   . Hypokalemia   . Inability to ambulate due to hip 10/12/2015  . Mandibular dysfunction   . Onychomycosis 09/02/2012  . Osteoarthritis   . Polycythemia vera ([place])   . Presence of IVC filter 05/25/2015  . Stroke ([place]) 03/26/2015   cerebellar  . Urinary incontinence in female 09/02/2016   Past Surgical History:  Procedure Laterality Date  . ABDOMINAL HYSTERECTOMY    . BLEPHAROPLASTY Right 03/2017   right upper eyelid  . CARDIAC CATHETERIZATION Right 04/03/2015   Procedure: CENTRAL LINE INSERTION;  Surgeon: Sherri Rad, MD;  Location: [facility] ORS;  Service: General;  Laterality: Right;  . COLECTOMY WITH COLOSTOMY CREATION/HARTMANN PROCEDURE N/A 04/03/2015   Procedure: COLECTOMY WITH COLOSTOMY CREATION/HARTMANN PROCEDURE;  Surgeon: Sherri Rad, MD;  Location: [facility] ORS;  Service: General;  Laterality: N/A;  . COLON SURGERY    . FOOT AMPUTATION     partial  . FOOT AMPUTATION Right   . IVC FILTER INSERTION    . JOINT REPLACEMENT     Left and Right Hip  . PERIPHERAL VASCULAR CATHETERIZATION N/A 04/02/2015   Procedure: Visceral Angiography;  Surgeon: Algernon Huxley, MD;  Location: [place] CV LAB;  Service: Cardiovascular;  Laterality: N/A;  . PERIPHERAL VASCULAR CATHETERIZATION N/A 04/02/2015   Procedure: Visceral Artery Intervention;  Surgeon: Algernon Huxley, MD;  Location: [place] CV LAB;  Service: Cardiovascular;  Laterality: N/A;  . PERIPHERAL VASCULAR CATHETERIZATION N/A 05/25/2015   Procedure: IVC Filter Insertion;  Surgeon: Katha Cabal, MD;  Location: [place] CV LAB;  Service: Cardiovascular;  Laterality: N/A;  . TOE  AMPUTATION     right  . TOTAL HIP ARTHROPLASTY       A IV Location/Drains/Wounds Patient Lines/Drains/Airways Status   Active Line/Drains/Airways    Name:   Placement date:   Placement time:   Site:   Days:   Colostomy LLQ   04/03/15    1256    LLQ   1420   Colostomy LLQ   06/15/18    1648    LLQ   251   Pressure Injury 08/13/18  Unstageable - Full thickness tissue loss in which the base of the ulcer is covered by slough (yellow, tan, gray, green or brown) and/or eschar (tan, brown or black) in the wound bed. 1 cm x 1 cm to left buttock. Area dark. Unable   08/13/18    0000     192   Pressure Injury 08/13/18 Unstageable - Full thickness tissue loss in which the base of the ulcer is covered by slough (yellow, tan, gray, green or brown) and/or eschar (tan, brown or black) in the wound bed. 0.5 cm x 3 cm to dark area to coccyx.   08/13/18    0000     192   Wound / Incision (Open or Dehisced) 09/27/18 Buttocks Left   09/27/18    2025    Buttocks   147          Intake/Output Last 24 hours No intake or output data in the 24 hours ending 02/13/2019 1407  Labs/Imaging Results for orders placed or performed during the hospital encounter of 02/28/2019 (from the past 48 hour(s))  Blood gas, venous     Status: Abnormal   Collection Time: 03/02/2019 11:53 AM  Result Value Ref Range   pH, Ven 7.28 7.250 - 7.430   pCO2, Ven 86 (HH) 44.0 - 60.0 mmHg    Comment: CRITICAL RESULT CALLED TO, READ BACK BY AND VERIFIED WITH:  RN Clemencia Helzer AT 3016, 02/20/2019, LS    pO2, Ven <31.0 (LL) 32.0 - 45.0 mmHg   Bicarbonate 40.4 (H) 20.0 - 28.0 mmol/L   Acid-Base Excess 10.0 (H) 0.0 - 2.0 mmol/L   O2 Saturation 45.9 %   Patient temperature 37.0    Collection site VENOUS    Sample type VENOUS     Comment: Performed at [facility], [address] [place], [place] [address]  CBC with Differential     Status: Abnormal   Collection Time: 03/01/2019 12:06 PM  Result Value Ref Range   WBC 16.2 (H) 4.0 - 10.5 K/uL   RBC 4.10 3.87 - 5.11 MIL/uL   Hemoglobin 9.5 (L) 12.0 - 15.0 g/dL   HCT 35.0 (L) 36.0 - 46.0 %   MCV 85.4 80.0 - 100.0 fL   MCH 23.2 (L) 26.0 - 34.0 pg   MCHC 27.1 (L) 30.0 - 36.0 g/dL   RDW 31.2 (H) 11.5 - 15.5 %   Platelets 279 150 - 400 K/uL   nRBC 3.2 (H) 0.0 - 0.2 %   Neutrophils Relative % 84 %   Neutro Abs 14.6 (H)  1.7 - 7.7 K/uL   Band Neutrophils 6 %   Lymphocytes Relative 7 %   Lymphs Abs 1.1 0.7 - 4.0 K/uL   Monocytes Relative 2 %   Monocytes Absolute 0.3 0.1 - 1.0 K/uL   Eosinophils Relative 0 %   Eosinophils Absolute 0.0 0.0 - 0.5 K/uL   Basophils Relative 0 %   Basophils Absolute 0.0 0.0 - 0.1 K/uL   RBC Morphology  MIXED RBC POPULATION    nRBC 5 (H) 0 /100 WBC   Metamyelocytes Relative 1 %   Abs Immature Granulocytes 0.20 (H) 0.00 - 0.07 K/uL   Polychromasia PRESENT    Spherocytes PRESENT    Giant PLTs PRESENT     Comment: Performed at [facility], [address] [place], [place] [address]  Comprehensive metabolic panel     Status: Abnormal   Collection Time: 03/09/2019 12:06 PM  Result Value Ref Range   Sodium 142 135 - 145 mmol/L   Potassium 3.9 3.5 - 5.1 mmol/L   Chloride 105 98 - 111 mmol/L   CO2 33 (H) 22 - 32 mmol/L   Glucose, Bld 120 (H) 70 - 99 mg/dL   BUN 17 8 - 23 mg/dL   Creatinine, Ser 0.48 0.44 - 1.00 mg/dL   Calcium 8.4 (L) 8.9 - 10.3 mg/dL   Total Protein 7.5 6.5 - 8.1 g/dL   Albumin 3.1 (L) 3.5 - 5.0 g/dL   AST 17 15 - 41 U/L   ALT 10 0 - 44 U/L   Alkaline Phosphatase 66 38 - 126 U/L   Total Bilirubin 0.9 0.3 - 1.2 mg/dL   GFR calc non Af Amer >60 >60 mL/min   GFR calc Af Amer >60 >60 mL/min   Anion gap 4 (L) 5 - 15    Comment: Performed at [facility], [address] [place], [place] [address]  Troponin I - ONCE - STAT     Status: Abnormal   Collection Time: 02/22/2019 12:06 PM  Result Value Ref Range   Troponin I 0.09 (HH) <0.03 ng/mL    Comment: CRITICAL RESULT CALLED TO, READ BACK BY AND VERIFIED WITH KATE Lavonda Thal AT 1242 ON 02/14/2019 JJB Performed at [facility], [address] [place], [place] [address]   Glucose, capillary     Status: Abnormal   Collection Time: 02/20/2019 12:21 PM  Result Value Ref Range   Glucose-Capillary 100 (H) 70 - 99 mg/dL   Comment 1 Document in Chart    Dg Chest 1 View  Result Date:  02/14/2019 CLINICAL DATA:  Acute shortness of breath and hypoxia. EXAM: CHEST  1 VIEW COMPARISON:  02/18/2019 chest radiograph, chest CT and prior studies FINDINGS: Cardiomediastinal silhouette is unchanged. RIGHT basilar atelectasis again noted. There is no evidence of focal airspace disease, pulmonary edema, suspicious pulmonary nodule/mass, pleural effusion, or pneumothorax. No acute bony abnormalities are identified. IMPRESSION: RIGHT basilar atelectasis. Electronically Signed   By: Margarette Canada M.D.   On: 02/25/2019 13:39   Ct Head Wo Contrast  Result Date: 02/27/2019 CLINICAL DATA:  85 year old female with altered mental status. EXAM: CT HEAD WITHOUT CONTRAST TECHNIQUE: Contiguous axial images were obtained from the base of the skull through the vertex without intravenous contrast. COMPARISON:  08/30/2018 MR, 08/30/2018 CT and prior studies FINDINGS: Brain: No evidence of acute infarction, hemorrhage, hydrocephalus, extra-axial collection or mass lesion/mass effect. Atrophy and mild chronic small-vessel white matter ischemic changes noted. Vascular: Atherosclerotic calcifications again noted. Skull: Normal. Negative for fracture or focal lesion. Sinuses/Orbits: No acute finding. Other: None. IMPRESSION: 1. No evidence of acute intracranial abnormality. 2. Atrophy and mild chronic small-vessel white matter ischemic changes. Electronically Signed   By: Margarette Canada M.D.   On: 02/23/2019 12:38    Pending Labs Unresulted Labs (From admission, onward)    Start     Ordered   02/28/19 0500  Creatinine, serum  (enoxaparin (LOVENOX)    CrCl >/=  30 ml/min)  Weekly,   STAT    Comments:  while on enoxaparin therapy    02/16/2019 1349   02/22/19 6962  Basic metabolic panel  Tomorrow morning,   STAT     02/20/2019 1349   02/22/19 0500  CBC  Tomorrow morning,   STAT     02/26/2019 1349   02/28/2019 1153  Urinalysis, Complete w Microscopic  (ALOC)  ONCE - STAT,   STAT     03/04/2019 1152           Vitals/Pain Today's Vitals   02/23/2019 1300 03/03/2019 1315 03/07/2019 1330 03/02/2019 1400  BP: (!) 141/74  118/63 122/66  Pulse: 91  82 79  Resp: (!) 26 18 20 20   Temp:      TempSrc:      SpO2: 99%  99% 100%  Weight:      Height:      PainSc:        Isolation Precautions No active isolations  Medications Medications  enoxaparin (LOVENOX) injection 40 mg (has no administration in time range)  sodium chloride flush (NS) 0.9 % injection 3 mL (has no administration in time range)  acetaminophen (TYLENOL) tablet 650 mg (has no administration in time range)    Or  acetaminophen (TYLENOL) suppository 650 mg (has no administration in time range)  polyethylene glycol (MIRALAX / GLYCOLAX) packet 17 g (has no administration in time range)  ondansetron (ZOFRAN) tablet 4 mg (has no administration in time range)    Or  ondansetron (ZOFRAN) injection 4 mg (has no administration in time range)  albuterol (PROVENTIL) (2.5 MG/3ML) 0.083% nebulizer solution 2.5 mg (has no administration in time range)  furosemide (LASIX) injection 60 mg (60 mg Intravenous Given 03/08/2019 1348)    Mobility non-ambulatory Moderate fall risk   Focused Assessments Neuro Assessment Handoff:  Swallow screen pass? No  Cardiac Rhythm: Sinus tachycardia   Last date known well: 02/20/19   Neuro Assessment:   Neuro Checks:      Last Documented NIHSS Modified Score:   Has TPA been given? No If patient is a Neuro Trauma and patient is going to OR before floor call report to 4N Charge nurse: [phone] or [phone]  , Pulmonary Assessment Handoff:  Lung sounds: Bilateral Breath Sounds: Rhonchi L Breath Sounds: Rhonchi R Breath Sounds: Rhonchi O2 Device: Bi-PAP O2 Flow Rate (L/min): 2 L/min      R Recommendations: See Admitting Provider Note  Report given to:   Additional Notes:

## 2019-02-21 NOTE — ED Notes (Signed)
This RN attempted to contact patient's granddaughter [name] to inform her of admit/transfer to ICU. Left brief voicemail to return phone call.

## 2019-02-21 NOTE — ED Notes (Signed)
Date and time results received: 02/28/2019 1245 (use smartphrase ".now" to insert current time)  Test: troponin Critical Value: 0.09  Name of Provider Notified: Jimmye Norman  Orders Received? Or Actions Taken?:

## 2019-02-21 NOTE — H&P (Signed)
[facility] NAME: Yolanda Wells    MR#:  [id number]  DATE OF BIRTH:  1932-07-28  DATE OF ADMISSION:  02/23/2019  PRIMARY CARE PHYSICIAN: [name], MD   REQUESTING/REFERRING PHYSICIAN: Dr. Jimmye Norman  CHIEF COMPLAINT:   Chief Complaint  Patient presents with  . Altered Mental Status  . Shortness of Breath    HISTORY OF PRESENT ILLNESS:  Yolanda Wells  is a 85 y.o. female with a known history of polycythemia vera for which patient follows up with [facility], pulmonary embolism and DVT on chronic anticoagulation with Eliquis, chronic hypoxic respiratory failure on 2 L of oxygen at baseline ,chronic diastolic CHF and hypertension was brought into the emergency room today with complaints of shortness of breath and confusion, drowsiness. Patient was discharged from the hospital yesterday after being treated for COPD exacerbation.  Patient was found to be tachypneic breathing 30 times a minute, drowsy and elevated CO2 on venous blood gas.  Chest x-ray shows nothing acute. Patient is being admitted for acute on chronic hypercarbic and hypoxic respiratory failure  PAST MEDICAL HISTORY:   Past Medical History:  Diagnosis Date  . Acute deep vein thrombosis (DVT) of distal vein of left lower extremity ([place]) 10/03/2015  . Acute pulmonary embolism ([place]) 10/03/2015  . Anticoagulant long-term use   . Arthritis   . Arthritis   . CHF (congestive heart failure) ([place])   . Chronic anticoagulation 10/03/2015  . Collagen vascular disease ([place])   . Colostomy in place [facility]) 10/04/2015  . Deep venous thrombosis ([facility])    right lower extremity  . Dependent edema   . Diverticulosis of intestine with bleeding 04/02/2015  . Glenohumeral arthritis 06/28/2012  . Hammertoe 09/02/2012  . History of bilateral hip replacements 09/02/2012  . History of hysterectomy   . Hypertension   . Hypertension   . Hypokalemia   . Inability to ambulate due to hip  10/12/2015  . Mandibular dysfunction   . Onychomycosis 09/02/2012  . Osteoarthritis   . Polycythemia vera ([place])   . Presence of IVC filter 05/25/2015  . Stroke ([place]) 03/26/2015   cerebellar  . Urinary incontinence in female 09/02/2016    PAST SURGICAL HISTORY:   Past Surgical History:  Procedure Laterality Date  . ABDOMINAL HYSTERECTOMY    . BLEPHAROPLASTY Right 03/2017   right upper eyelid  . CARDIAC CATHETERIZATION Right 04/03/2015   Procedure: CENTRAL LINE INSERTION;  Surgeon: Sherri Rad, MD;  Location: [facility] ORS;  Service: General;  Laterality: Right;  . COLECTOMY WITH COLOSTOMY CREATION/HARTMANN PROCEDURE N/A 04/03/2015   Procedure: COLECTOMY WITH COLOSTOMY CREATION/HARTMANN PROCEDURE;  Surgeon: Sherri Rad, MD;  Location: [facility] ORS;  Service: General;  Laterality: N/A;  . COLON SURGERY    . FOOT AMPUTATION     partial  . FOOT AMPUTATION Right   . IVC FILTER INSERTION    . JOINT REPLACEMENT     Left and Right Hip  . PERIPHERAL VASCULAR CATHETERIZATION N/A 04/02/2015   Procedure: Visceral Angiography;  Surgeon: Algernon Huxley, MD;  Location: [place] [facility];  Service: Cardiovascular;  Laterality: N/A;  . PERIPHERAL VASCULAR CATHETERIZATION N/A 04/02/2015   Procedure: Visceral Artery Intervention;  Surgeon: Algernon Huxley, MD;  Location: [place] [facility];  Service: Cardiovascular;  Laterality: N/A;  . PERIPHERAL VASCULAR CATHETERIZATION N/A 05/25/2015   Procedure: IVC Filter Insertion;  Surgeon: Katha Cabal, MD;  Location: [place] [facility];  Service: Cardiovascular;  Laterality: N/A;  .  TOE AMPUTATION     right  . TOTAL HIP ARTHROPLASTY      SOCIAL HISTORY:   Social History   Tobacco Use  . Smoking status: Never Smoker  . Smokeless tobacco: Never Used  Substance Use Topics  . Alcohol use: No    FAMILY HISTORY:   Family History  Problem Relation Age of Onset  . Brain cancer Mother   . Heart attack Father   . COPD Father   . Coronary artery disease Father    . Hypertension Other   . Arthritis-Osteo Other   . Diabetes Brother   . Arthritis Brother   . Osteosarcoma Son   . Bone cancer Son   . Arthritis Sister   . Arthritis Brother   . Hypertension Brother   . Coronary artery disease Brother   . Malignant hyperthermia Brother     DRUG ALLERGIES:  No Known Allergies  REVIEW OF SYSTEMS:   Review of Systems  Unable to perform ROS: Mental status change   MEDICATIONS AT HOME:   Prior to Admission medications   Medication Sig Start Date End Date Taking? Authorizing Provider  amLODipine (NORVASC) 5 MG tablet Take 2 tablets (10 mg total) by mouth daily. 10/31/18  Yes Fritzi Mandes, MD  aspirin EC 81 MG tablet Take 81 mg by mouth daily.   Yes [provider]  cholecalciferol (VITAMIN D) 1000 units tablet Take 1,000 Units by mouth daily.   Yes [provider]  ELIQUIS 5 MG TABS tablet TAKE ONE TABLET BY MOUTH TWICE A DAY Patient taking differently: Take 5 mg by mouth 2 (two) times daily.  11/04/18  Yes Karen Kitchens, NP  estradiol (ESTRACE VAGINAL) 0.1 MG/GM vaginal cream Apply 0.5mg  (pea-sized amount)  just inside the vaginal introitus with a finger-tip on Monday, Wednesday and Friday nights. 08/09/18  Yes McGowan, Larene Beach A, PA-C  folic acid (FOLVITE) 007 MCG tablet Take 400 mcg by mouth daily.   Yes [provider]  gabapentin (NEURONTIN) 300 MG capsule Take 300 mg by mouth at bedtime. 11/23/18  Yes [provider]  ipratropium-albuterol (DUONEB) 0.5-2.5 (3) MG/3ML SOLN Take 3 mLs by nebulization every 4 (four) hours as needed (for shortness of breath/wheezing). 11/10/18  Yes Marylou Wages, MD  JAKAFI 15 MG tablet TAKE 1 TABLET (15 MG TOTAL) BY MOUTH 2 TIMES DAILY. 12/27/18  Yes Corcoran, Drue Second, MD  pantoprazole (PROTONIX) 40 MG tablet Take 1 tablet (40 mg total) by mouth daily. 09/12/18  Yes [name], MD  albuterol (PROVENTIL HFA;VENTOLIN HFA) 108 (90 Base) MCG/ACT inhaler Inhale 2 puffs into the  lungs every 6 (six) hours as needed for wheezing or shortness of breath. 02/20/19   Gouru, Illene Silver, MD  docusate sodium (COLACE) 100 MG capsule Take 100 mg by mouth 2 (two) times daily.    [provider]  furosemide (LASIX) 20 MG tablet Take 20 mg by mouth as needed for fluid (TAKING ATLEAST ONCE A WEEK).  12/14/18   [provider]  ondansetron (ZOFRAN ODT) 4 MG disintegrating tablet Take 1 tablet (4 mg total) by mouth every 8 (eight) hours as needed. 11/26/18   Alfred Levins, [place], MD  polyethylene glycol (MIRALAX / GLYCOLAX) 17 g packet Take 17 g by mouth 4 (four) times a week. Monday, Wednesday, Saturday, Sunday    [provider]  potassium chloride SA (K-DUR,KLOR-CON) 20 MEQ tablet Take 20 mEq by mouth as needed (ONLY TAKE WHEN TAKING LASIX).    [provider]  predniSONE (  STERAPRED UNI-PAK 21 TAB) 10 MG (21) TBPK tablet Take 1 tablet (10 mg total) by mouth daily. Take 6 tablets by mouth for 1 day followed by  5 tablets by mouth for 1 day followed by  4 tablets by mouth for 1 day followed by  3 tablets by mouth for 1 day followed by  2 tablets by mouth for 1 day followed by  1 tablet by mouth for a day and stop 02/20/19   Nicholes Mango, MD     VITAL SIGNS:  Blood pressure 122/66, pulse 79, temperature (!) 97.1 F (36.2 C), temperature source Oral, resp. rate 20, height 5\' 8"  (1.727 m), weight 90 kg, SpO2 100 %.  PHYSICAL EXAMINATION:  Physical Exam  GENERAL:  85 y.o.-year-old patient lying in the bed. ON bipap. Critically ill looking EYES: Pupils equal, round, reactive to light and accommodation. No scleral icterus. Extraocular muscles intact.  HEENT: Head atraumatic, normocephalic. Oropharynx and nasopharynx clear. No oropharyngeal erythema, moist oral mucosa  NECK:  Supple, no jugular venous distention. No thyroid enlargement, no tenderness.  LUNGS: Normal breath sounds bilaterally, no wheezing, rales, rhonchi. No use of accessory muscles of respiration.   CARDIOVASCULAR: S1, S2 normal. No murmurs, rubs, or gallops.  ABDOMEN: Soft, nontender, nondistended. Bowel sounds present. No organomegaly or mass.  EXTREMITIES: No pedal edema, cyanosis, or clubbing. + 2 pedal & radial pulses b/l.   Right partial foot amputation NEUROLOGIC: Not following instructions PSYCHIATRIC: The patient is drowzy SKIN: No obvious rash, lesion, or ulcer.   LABORATORY PANEL:   CBC Recent Labs  Lab 03/07/2019 1206  WBC 16.2*  HGB 9.5*  HCT 35.0*  PLT 279   ------------------------------------------------------------------------------------------------------------------  Chemistries  Recent Labs  Lab 02/19/19 0520 02/26/2019 1206  NA 143 142  K 4.0 3.9  CL 105 105  CO2 34* 33*  GLUCOSE 124* 120*  BUN 11 17  CREATININE 0.39* 0.48  CALCIUM 8.6* 8.4*  MG 1.9  --   AST  --  17  ALT  --  10  ALKPHOS  --  66  BILITOT  --  0.9   ------------------------------------------------------------------------------------------------------------------  Cardiac Enzymes Recent Labs  Lab 02/23/2019 1206  TROPONINI 0.09*   ------------------------------------------------------------------------------------------------------------------  RADIOLOGY:  Dg Chest 1 View  Result Date: 02/25/2019 CLINICAL DATA:  Acute shortness of breath and hypoxia. EXAM: CHEST  1 VIEW COMPARISON:  02/18/2019 chest radiograph, chest CT and prior studies FINDINGS: Cardiomediastinal silhouette is unchanged. RIGHT basilar atelectasis again noted. There is no evidence of focal airspace disease, pulmonary edema, suspicious pulmonary nodule/mass, pleural effusion, or pneumothorax. No acute bony abnormalities are identified. IMPRESSION: RIGHT basilar atelectasis. Electronically Signed   By: Margarette Canada M.D.   On: 02/11/2019 13:39   Ct Head Wo Contrast  Result Date: 03/06/2019 CLINICAL DATA:  85 year old female with altered mental status. EXAM: CT HEAD WITHOUT CONTRAST TECHNIQUE: Contiguous axial  images were obtained from the base of the skull through the vertex without intravenous contrast. COMPARISON:  08/30/2018 MR, 08/30/2018 CT and prior studies FINDINGS: Brain: No evidence of acute infarction, hemorrhage, hydrocephalus, extra-axial collection or mass lesion/mass effect. Atrophy and mild chronic small-vessel white matter ischemic changes noted. Vascular: Atherosclerotic calcifications again noted. Skull: Normal. Negative for fracture or focal lesion. Sinuses/Orbits: No acute finding. Other: None. IMPRESSION: 1. No evidence of acute intracranial abnormality. 2. Atrophy and mild chronic small-vessel white matter ischemic changes. Electronically Signed   By: Margarette Canada M.D.   On: 03/04/2019 12:38     IMPRESSION AND PLAN:   *  Acute on chronic hypoxic hypercarbic respiratory failure likely secondary to acute on chronic diastolic congestive heart failure Patient does have crackles on examination but no pulmonary edema on x-ray.  We will give her 1 dose of Lasix.  BiPAP support.  Admit to stepdown unit.  Discussed with Dr. Patsey Berthold.  I do not hear any wheezing.  Good air entry on both sides. Patient does have poor respiratory performance at baseline. Patient has not left her house.  Afebrile.  Unlikely to be COVID-19.   *Acute metabolic encephalopathy secondary to hypercarbia.  Does seem to have baseline mild cognitive impairment  *COPD.  No wheezing.  Nebulizers and inhalers ordered.  *DVT prophylaxis Patient is on Eliquis at home with history of recurrent DVTs.  Last episode 2016.  At this time patient is n.p.o. and unable to take medications.  I have ordered Lovenox for DVT prophylaxis which can be discontinued and Eliquis restarted once she is awake and able to take pills.  All the records are reviewed and case discussed with ED provider. Management plans discussed with the patient, family and they are in agreement.  CODE STATUS: FULL CODE  Visitors not allowed in the hospital at  this time.  I called and spoke with granddaughter [name] and son Cyndi Bender over the phone through a conference call.  Patient was DNR in the past with hospice services.  Family few weeks back had switched patient to full code when patient told them that she would like resuscitation and ventilator if needed.  They also got patient off hospice services and got home health services started.  Discussed regarding patient's hypercapnia, need for BiPAP and likely recurrent admissions for the same problem.  Also discussed with them regarding my discussion with pulmonology Dr. Patsey Berthold suggesting possibly hypercarbia with BiPAP dependence.  They tell me that patient never had to be resuscitated in the past and that she deserves to be resuscitated and placed on a ventilator if needed.  Explained poor prognosis and medical advise for patient to be DNR/DNI.[name]  But family adamant patient should be a full code.  Discussed regarding consulting palliative care.  Family agrees.  Patient will be admitted to stepdown.   TOTAL CRITICAL CARE TIME TAKING CARE OF THIS PATIENT: 80 minutes.   Neita Carp M.D on 02/22/2019 at 2:32 PM  Between 7am to 6pm - Pager - [phone]  After 6pm go to [email or web address] - password [email or web address] [facility]  Office  [phone]  CC: Primary care physician; [name], MD  Note: This dictation was prepared with Dragon dictation along with smaller phrase technology. Any transcriptional errors that result from this process are unintentional.

## 2019-02-21 NOTE — ED Triage Notes (Signed)
Patient arrives via EMS after being discharged from [facility] last night for COPD exacerbation. Patient's granddaughter reports patient has had altered mental status since being discharged last night around 8pm, reports patient has been slurring words.

## 2019-02-21 NOTE — Progress Notes (Signed)
Patient changed to AVAPS mode by Dr. Patsey Berthold. Settings are VT 460, EPAP +8, RR-8,35%, max pressure 18, and min pressure 10.

## 2019-02-21 NOTE — ED Provider Notes (Signed)
[facility] Emergency Department Provider Note       Time seen: ----------------------------------------- 11:55 AM on 02/25/2019 ----------------------------------------- Level V caveat: History/ROS limited by altered mental status  I have reviewed the triage vital signs and the nursing notes.  HISTORY   Chief Complaint Altered Mental Status and Shortness of Breath    HPI Yolanda Wells is a 85 y.o. female with a history of DVT, PE, CHF, collagen vascular disease, hypertension who presents to the ED for altered mental status.  Reportedly patient was just discharged in the hospital yesterday.  She has been reportedly altered and cannot give further review of systems or report.  Past Medical History:  Diagnosis Date  . Acute deep vein thrombosis (DVT) of distal vein of left lower extremity ([place]) 10/03/2015  . Acute pulmonary embolism ([place]) 10/03/2015  . Anticoagulant long-term use   . Arthritis   . Arthritis   . CHF (congestive heart failure) ([place])   . Chronic anticoagulation 10/03/2015  . Collagen vascular disease ([place])   . Colostomy in place [facility]) 10/04/2015  . Deep venous thrombosis (H[facility])    right lower extremity  . Dependent edema   . Diverticulosis of intestine with bleeding 04/02/2015  . Glenohumeral arthritis 06/28/2012  . Hammertoe 09/02/2012  . History of bilateral hip replacements 09/02/2012  . History of hysterectomy   . Hypertension   . Hypertension   . Hypokalemia   . Inability to ambulate due to hip 10/12/2015  . Mandibular dysfunction   . Onychomycosis 09/02/2012  . Osteoarthritis   . Polycythemia vera ([place])   . Presence of IVC filter 05/25/2015  . Stroke ([place]) 03/26/2015   cerebellar  . Urinary incontinence in female 09/02/2016    Patient Active Problem List   Diagnosis Date Noted  . Decreased appetite 02/02/2019  . Poor appetite 12/24/2018  . Unsteady 12/24/2018  . Palliative care encounter 12/24/2018  . SOB (shortness  of breath) 10/28/2018  . Shortness of breath 09/27/2018  . COPD exacerbation ([place]) 09/22/2018  . Sepsis ([place]) 09/22/2018  . Lower GI bleed 09/09/2018  . AMS (altered mental status) 08/30/2018  . Pressure injury of skin 08/13/2018  . B12 deficiency 08/04/2018  . Anemia 06/07/2018  . Secondary myelofibrosis ([place]) 02/17/2018  . Goals of care, counseling/discussion 12/23/2017  . Palliative care by specialist   . DNR (do not resuscitate) discussion   . Weakness generalized   . Chronic diastolic CHF (congestive heart failure) ([place]) 07/28/2017  . Vascular dementia without behavioral disturbance ([place]) 07/08/2017  . Influenza with respiratory manifestation 12/01/2016  . Respiratory distress 11/23/2016  . Urinary incontinence in female 09/02/2016  . Vision loss of right eye 08/26/2016  . Blindness of right eye 08/26/2016  . Inability to ambulate due to hip 10/12/2015  . Colostomy in place [facility]) 10/04/2015  . Long term current use of anticoagulant therapy 10/03/2015  . Leukocytosis 09/25/2015  . CAP (community acquired pneumonia) 09/24/2015  . COPD (chronic obstructive pulmonary disease) ([place]) 09/24/2015  . UTI (urinary tract infection) 07/16/2015  . Abdominal wall cellulitis 07/15/2015  . DVT (deep venous thrombosis) ([place]) 07/15/2015  . HTN (hypertension) 07/15/2015  . Polycythemia vera ([place]) 07/15/2015  . Arthritis 07/15/2015  . Pulmonary emboli ([place]) 05/25/2015  . Diverticulosis of colon with hemorrhage 04/02/2015  . Diverticulosis of intestine with bleeding 04/02/2015  . Fall 01/10/2015  . Osteoarthritis 01/10/2015  . Hammertoe 09/02/2012  . S/P transmetatarsal amputation of foot ([place]) 09/02/2012  . Onychomycosis 09/02/2012  . Other specified dermatoses 09/02/2012  .  S/P hip replacement 08/23/2012  . Glenohumeral arthritis 06/28/2012    Past Surgical History:  Procedure Laterality Date  . ABDOMINAL HYSTERECTOMY    . BLEPHAROPLASTY Right 03/2017   right upper eyelid  .  CARDIAC CATHETERIZATION Right 04/03/2015   Procedure: CENTRAL LINE INSERTION;  Surgeon: Sherri Rad, MD;  Location: [facility] ORS;  Service: General;  Laterality: Right;  . COLECTOMY WITH COLOSTOMY CREATION/HARTMANN PROCEDURE N/A 04/03/2015   Procedure: COLECTOMY WITH COLOSTOMY CREATION/HARTMANN PROCEDURE;  Surgeon: Sherri Rad, MD;  Location: [facility] ORS;  Service: General;  Laterality: N/A;  . COLON SURGERY    . FOOT AMPUTATION     partial  . FOOT AMPUTATION Right   . IVC FILTER INSERTION    . JOINT REPLACEMENT     Left and Right Hip  . PERIPHERAL VASCULAR CATHETERIZATION N/A 04/02/2015   Procedure: Visceral Angiography;  Surgeon: Algernon Huxley, MD;  Location: [place] CV LAB;  Service: Cardiovascular;  Laterality: N/A;  . PERIPHERAL VASCULAR CATHETERIZATION N/A 04/02/2015   Procedure: Visceral Artery Intervention;  Surgeon: Algernon Huxley, MD;  Location: [place] CV LAB;  Service: Cardiovascular;  Laterality: N/A;  . PERIPHERAL VASCULAR CATHETERIZATION N/A 05/25/2015   Procedure: IVC Filter Insertion;  Surgeon: Katha Cabal, MD;  Location: [place] CV LAB;  Service: Cardiovascular;  Laterality: N/A;  . TOE AMPUTATION     right  . TOTAL HIP ARTHROPLASTY      Allergies Patient has no known allergies.  Social History Social History   Tobacco Use  . Smoking status: Never Smoker  . Smokeless tobacco: Never Used  Substance Use Topics  . Alcohol use: No  . Drug use: No    Review of Systems Unknown, positive for altered mental status  All systems negative/normal/unremarkable except as stated in the HPI  ____________________________________________   PHYSICAL EXAM:  VITAL SIGNS: ED Triage Vitals [02/26/2019 1153]  Enc Vitals Group     BP      Pulse Rate 100     Resp (!) 26     Temp (!) 97.1 F (36.2 C)     Temp Source Oral     SpO2 99 %     Weight      Height      Head Circumference      Peak Flow      Pain Score      Pain Loc      Pain Edu?      Excl. in [place]?      Constitutional: Somnolent Eyes: Conjunctivae are normal. Normal extraocular movements. ENT      Head: Normocephalic and atraumatic.      Nose: No congestion/rhinnorhea.      Mouth/Throat: Mucous membranes are moist.      Neck: No stridor. Cardiovascular: Normal rate, regular rhythm. No murmurs, rubs, or gallops. Respiratory: Normal respiratory effort without tachypnea nor retractions. Breath sounds are clear and equal bilaterally. No wheezes/rales/rhonchi. Gastrointestinal: Soft and nontender. Normal bowel sounds Musculoskeletal: Nontender with normal range of motion in extremities. No lower extremity tenderness nor edema. Neurologic:  Normal speech and language. No gross focal neurologic deficits are appreciated.  Skin:  Skin is warm, dry and intact. No rash noted. Psychiatric: Mood and affect are normal. Speech and behavior are normal.  ____________________________________________  EKG: Interpreted by me.  Sinus rhythm with a rate of 101 bpm, left axis deviation, right bundle branch block, left anterior fascicular block  ____________________________________________  ED COURSE:  As part of my medical decision making,  I reviewed the following data within the [place] History obtained from family if available, nursing notes, old chart and ekg, as well as notes from prior ED visits. Patient presented for altered mental status, we will assess with labs and imaging as indicated at this time.   Procedures  Yolanda Wells was evaluated in Emergency Department on 02/18/2019 for the symptoms described in the history of present illness. She was evaluated in the context of the global COVID-19 pandemic, which necessitated consideration that the patient might be at risk for infection with the SARS-CoV-2 virus that causes COVID-19. Institutional protocols and algorithms that pertain to the evaluation of patients at risk for COVID-19 are in a state of rapid change based on  information released by regulatory bodies including the CDC and federal and state organizations. These policies and algorithms were followed during the patient's care in the ED.  ____________________________________________   LABS (pertinent positives/negatives)  Labs Reviewed  CBC WITH DIFFERENTIAL/PLATELET - Abnormal; Notable for the following components:      Result Value   WBC 16.2 (*)    Hemoglobin 9.5 (*)    HCT 35.0 (*)    MCH 23.2 (*)    MCHC 27.1 (*)    RDW 31.2 (*)    nRBC 3.2 (*)    Neutro Abs 14.6 (*)    nRBC 5 (*)    Abs Immature Granulocytes 0.20 (*)    All other components within normal limits  COMPREHENSIVE METABOLIC PANEL - Abnormal; Notable for the following components:   CO2 33 (*)    Glucose, Bld 120 (*)    Calcium 8.4 (*)    Albumin 3.1 (*)    Anion gap 4 (*)    All other components within normal limits  TROPONIN I - Abnormal; Notable for the following components:   Troponin I 0.09 (*)    All other components within normal limits  BLOOD GAS, VENOUS - Abnormal; Notable for the following components:   pCO2, Ven 86 (*)    pO2, Ven <31.0 (*)    Bicarbonate 40.4 (*)    Acid-Base Excess 10.0 (*)    All other components within normal limits  GLUCOSE, CAPILLARY - Abnormal; Notable for the following components:   Glucose-Capillary 100 (*)    All other components within normal limits  URINALYSIS, COMPLETE (UACMP) WITH MICROSCOPIC  CBG MONITORING, ED   CRITICAL CARE Performed by: Laurence Aly   Total critical care time: 30 minutes  Critical care time was exclusive of separately billable procedures and treating other patients.  Critical care was necessary to treat or prevent imminent or life-threatening deterioration.  Critical care was time spent personally by me on the following activities: development of treatment plan with patient and/or surrogate as well as nursing, discussions with consultants, evaluation of patient's response to treatment,  examination of patient, obtaining history from patient or surrogate, ordering and performing treatments and interventions, ordering and review of laboratory studies, ordering and review of radiographic studies, pulse oximetry and re-evaluation of patient's condition.  RADIOLOGY Images were viewed by me  CT head, chest x-ray IMPRESSION: 1. No evidence of acute intracranial abnormality. 2. Atrophy and mild chronic small-vessel white matter ischemic changes.  ____________________________________________   DIFFERENTIAL DIAGNOSIS   Sepsis, acute hypercarbic respiratory failure, dehydration, electrolyte abnormality  FINAL ASSESSMENT AND PLAN  Altered mental status, acute hypercarbic respiratory failure  Plan: The patient had presented for altered mental status. Patient's labs did indicate leukocytosis as well as elevated troponin  and elevated CO2 level which is likely chronic.  PCO2 was 86 which is likely the reason for her altered mental status.  We have placed her on BiPAP, suspicion for coronavirus is low. Patient's imaging no any acute process at this time.   Laurence Aly, MD    Note: This note was generated in part or whole with voice recognition software. Voice recognition is usually quite accurate but there are transcription errors that can and very often do occur. I apologize for any typographical errors that were not detected and corrected.     Earleen Newport, MD 03/10/2019 [phone]

## 2019-02-22 ENCOUNTER — Encounter: Payer: Self-pay | Admitting: Primary Care

## 2019-02-22 ENCOUNTER — Other Ambulatory Visit: Payer: Self-pay

## 2019-02-22 ENCOUNTER — Inpatient Hospital Stay: Admit: 2019-02-22 | Payer: Medicare Other

## 2019-02-22 DIAGNOSIS — R4182 Altered mental status, unspecified: Secondary | ICD-10-CM

## 2019-02-22 DIAGNOSIS — J9602 Acute respiratory failure with hypercapnia: Secondary | ICD-10-CM

## 2019-02-22 DIAGNOSIS — Z515 Encounter for palliative care: Secondary | ICD-10-CM

## 2019-02-22 DIAGNOSIS — J9601 Acute respiratory failure with hypoxia: Secondary | ICD-10-CM

## 2019-02-22 DIAGNOSIS — Z7189 Other specified counseling: Secondary | ICD-10-CM

## 2019-02-22 LAB — BASIC METABOLIC PANEL
Anion gap: 7 (ref 5–15)
BUN: 18 mg/dL (ref 8–23)
CO2: 38 mmol/L — ABNORMAL HIGH (ref 22–32)
Calcium: 8.3 mg/dL — ABNORMAL LOW (ref 8.9–10.3)
Chloride: 100 mmol/L (ref 98–111)
Creatinine, Ser: 0.54 mg/dL (ref 0.44–1.00)
GFR calc Af Amer: >60 mL/min (ref >60)
GFR calc non Af Amer: >60 mL/min (ref >60)
Glucose, Bld: 101 mg/dL — ABNORMAL HIGH (ref 70–99)
Potassium: 3.3 mmol/L — ABNORMAL LOW (ref 3.5–5.1)
Sodium: 145 mmol/L (ref 135–145)

## 2019-02-22 LAB — URINALYSIS, ROUTINE W REFLEX MICROSCOPIC
Bilirubin Urine: NEGATIVE
Glucose, UA: NEGATIVE mg/dL
Ketones, ur: 20 mg/dL — AB
Nitrite: NEGATIVE
Protein, ur: 100 mg/dL — AB
Specific Gravity, Urine: 1.021 (ref 1.005–1.030)
WBC, UA: >50 WBC/hpf — ABNORMAL HIGH (ref 0–5)
pH: 6 (ref 5.0–8.0)

## 2019-02-22 LAB — CBC
HCT: 31 % — ABNORMAL LOW (ref 36.0–46.0)
Hemoglobin: 8.5 g/dL — ABNORMAL LOW (ref 12.0–15.0)
MCH: 23.4 pg — ABNORMAL LOW (ref 26.0–34.0)
MCHC: 27.4 g/dL — ABNORMAL LOW (ref 30.0–36.0)
MCV: 85.4 fL (ref 80.0–100.0)
Platelets: 193 10*3/uL (ref 150–400)
RBC: 3.63 MIL/uL — ABNORMAL LOW (ref 3.87–5.11)
RDW: 30.5 % — ABNORMAL HIGH (ref 11.5–15.5)
WBC: 8.1 10*3/uL (ref 4.0–10.5)
nRBC: 2.1 % — ABNORMAL HIGH (ref 0.0–0.2)

## 2019-02-22 LAB — BLOOD GAS, ARTERIAL
Acid-Base Excess: 15.9 mmol/L — ABNORMAL HIGH (ref 0.0–2.0)
Bicarbonate: 43.1 mmol/L — ABNORMAL HIGH (ref 20.0–28.0)
Delivery systems: POSITIVE
Expiratory PAP: 8
FIO2: 0.35
MECHVT: 460 mL
O2 Saturation: 98.7 %
Patient temperature: 37
RATE: 8 resp/min
pCO2 arterial: 62 mmHg — ABNORMAL HIGH (ref 32.0–48.0)
pH, Arterial: 7.45 (ref 7.350–7.450)
pO2, Arterial: 115 mmHg — ABNORMAL HIGH (ref 83.0–108.0)

## 2019-02-22 MED ORDER — LORAZEPAM 2 MG/ML IJ SOLN
INTRAMUSCULAR | Status: AC
  Administered 2019-02-22: 1 mg via INTRAVENOUS
  Filled 2019-02-22: qty 1

## 2019-02-22 MED ORDER — POTASSIUM CHLORIDE 10 MEQ/100ML IV SOLN
10.0000 meq | INTRAVENOUS | Status: AC
  Administered 2019-02-22 (×2): 10 meq via INTRAVENOUS
  Filled 2019-02-22 (×2): qty 100

## 2019-02-22 MED ORDER — LORAZEPAM 2 MG/ML IJ SOLN: 0.5000 mg | INTRAMUSCULAR | Status: DC | PRN

## 2019-02-22 MED ORDER — LORAZEPAM 2 MG/ML IJ SOLN
0.5000 mg | INTRAMUSCULAR | Status: DC | PRN
  Administered 2019-02-22: 12:00:00 1 mg via INTRAVENOUS

## 2019-02-22 MED ORDER — LACTATED RINGERS IV SOLN
INTRAVENOUS | Status: DC
  Administered 2019-02-22 – 2019-02-23 (×2): via INTRAVENOUS

## 2019-02-22 MED ORDER — DEXMEDETOMIDINE HCL IN NACL 400 MCG/100ML IV SOLN
0.4000 ug/kg/h | INTRAVENOUS | Status: DC
  Administered 2019-02-22: 15:00:00 0.4 ug/kg/h via INTRAVENOUS
  Administered 2019-02-22 (×2): 0.8 ug/kg/h via INTRAVENOUS
  Administered 2019-02-23 (×2): 1 ug/kg/h via INTRAVENOUS
  Filled 2019-02-22 (×4): qty 100

## 2019-02-22 MED ORDER — HALOPERIDOL LACTATE 5 MG/ML IJ SOLN
1.0000 mg | Freq: Four times a day (QID) | INTRAMUSCULAR | Status: DC | PRN
  Administered 2019-02-22 – 2019-02-23 (×3): 1 mg via INTRAVENOUS
  Filled 2019-02-22 (×4): qty 1

## 2019-02-22 MED ORDER — IPRATROPIUM-ALBUTEROL 0.5-2.5 (3) MG/3ML IN SOLN: 3.0000 mL | RESPIRATORY_TRACT | Status: DC | PRN

## 2019-02-22 MED ORDER — QUETIAPINE FUMARATE 25 MG PO TABS
50.0000 mg | ORAL_TABLET | Freq: Every day | ORAL | Status: DC
  Administered 2019-02-23 – 2019-02-26 (×4): 50 mg via ORAL
  Filled 2019-02-22 (×5): qty 2

## 2019-02-22 NOTE — Progress Notes (Signed)
[place] [facility] [place] NAME: Yolanda Wells    MR#:  [id number]  DATE OF BIRTH:  [birth year]  SUBJECTIVE:  CHIEF COMPLAINT:   Chief Complaint  Patient presents with  . Altered Mental Status  . Shortness of Breath   More awake today.  Confused.  Off BiPAP.  On 3 L oxygen.  REVIEW OF SYSTEMS:    Review of Systems  Unable to perform ROS: Mental status change    DRUG ALLERGIES:  No Known Allergies  VITALS:  Blood pressure (!) 103/50, pulse 60, temperature 98.1 F (36.7 C), temperature source Axillary, resp. rate 18, height 5\' 2"  (1.575 m), weight 88.1 kg, SpO2 100 %.  PHYSICAL EXAMINATION:   Physical Exam  GENERAL:  85 y.o.-year-old patient lying in the bed , obese EYES: Pupils equal, round, reactive to light and accommodation. No scleral icterus. Extraocular muscles intact.  HEENT: Head atraumatic, normocephalic. Oropharynx and nasopharynx clear.  NECK:  Supple, no jugular venous distention. No thyroid enlargement, no tenderness.  LUNGS: Normal breath sounds bilaterally, no wheezing, rales, rhonchi. No use of accessory muscles of respiration.  CARDIOVASCULAR: S1, S2 normal. No murmurs, rubs, or gallops.  ABDOMEN: Soft, nontender, nondistended. Bowel sounds present. No organomegaly or mass.  EXTREMITIES: No cyanosis, clubbing or edema b/l.    PSYCHIATRIC: The patient is drowzy SKIN: No obvious rash, lesion, or ulcer.   LABORATORY PANEL:   CBC Recent Labs  Lab 02/22/19 0425  WBC 8.1  HGB 8.5*  HCT 31.0*  PLT 193   ------------------------------------------------------------------------------------------------------------------ Chemistries  Recent Labs  Lab 02/19/19 0520 02/16/2019 1206 02/22/19 0425  NA 143 142 145  K 4.0 3.9 3.3*  CL 105 105 100  CO2 34* 33* 38*  GLUCOSE 124* 120* 101*  BUN 11 17 18   CREATININE 0.39* 0.48 0.54  CALCIUM 8.6* 8.4* 8.3*  MG 1.9  --   --   AST  --  17  --   ALT  --  10  --   ALKPHOS   --  66  --   BILITOT  --  0.9  --    ------------------------------------------------------------------------------------------------------------------  Cardiac Enzymes Recent Labs  Lab 03/03/2019 1206  TROPONINI 0.09*   ------------------------------------------------------------------------------------------------------------------  RADIOLOGY:  Dg Chest 1 View  Result Date: 03/01/2019 CLINICAL DATA:  Acute shortness of breath and hypoxia. EXAM: CHEST  1 VIEW COMPARISON:  02/18/2019 chest radiograph, chest CT and prior studies FINDINGS: Cardiomediastinal silhouette is unchanged. RIGHT basilar atelectasis again noted. There is no evidence of focal airspace disease, pulmonary edema, suspicious pulmonary nodule/mass, pleural effusion, or pneumothorax. No acute bony abnormalities are identified. IMPRESSION: RIGHT basilar atelectasis. Electronically Signed   By: Margarette Canada M.D.   On: 02/15/2019 13:39   Ct Head Wo Contrast  Result Date: 03/02/2019 CLINICAL DATA:  85 year old female with altered mental status. EXAM: CT HEAD WITHOUT CONTRAST TECHNIQUE: Contiguous axial images were obtained from the base of the skull through the vertex without intravenous contrast. COMPARISON:  08/30/2018 MR, 08/30/2018 CT and prior studies FINDINGS: Brain: No evidence of acute infarction, hemorrhage, hydrocephalus, extra-axial collection or mass lesion/mass effect. Atrophy and mild chronic small-vessel white matter ischemic changes noted. Vascular: Atherosclerotic calcifications again noted. Skull: Normal. Negative for fracture or focal lesion. Sinuses/Orbits: No acute finding. Other: None. IMPRESSION: 1. No evidence of acute intracranial abnormality. 2. Atrophy and mild chronic small-vessel white matter ischemic changes. Electronically Signed   By: Margarette Canada M.D.   On: 03/01/2019 12:38  ASSESSMENT AND PLAN:   * Acute on chronic hypoxic hypercarbic respiratory failure likely secondary to acute on chronic  diastolic congestive heart failure Patient does have crackles on examination but no pulmonary edema on x-ray Given IV lasix on admission Monitor I/Os Patient does have poor respiratory performance at baseline. Patient has not left her house.  Afebrile.  Unlikely to be COVID-19.  Transfer to floor and continue 2 L O2  *Acute metabolic encephalopathy secondary to hypercarbia.  Does seem to have baseline mild cognitive impairment. Improving  *COPD.  No wheezing.  Nebulizers and inhalers ordered.  *DVT prophylaxis Patient is on Eliquis at home with history of recurrent DVTs.  Last episode 2016.  At this time patient is n.p.o. and unable to take medications.  I have ordered Lovenox for DVT prophylaxis which can be discontinued and Eliquis restarted once she is awake and able to take pills.  Palliative care consulted  All the records are reviewed and case discussed with Care Management/Social Workerr. Management plans discussed with the patient, family and they are in agreement.  CODE STATUS: FULL CODE  DVT Prophylaxis: SCDs  TOTAL TIME TAKING CARE OF THIS PATIENT: 35 minutes.   POSSIBLE D/C IN 1-2 DAYS, DEPENDING ON CLINICAL CONDITION.  Neita Carp M.D on 02/22/2019 at 11:29 AM  Between 7am to 6pm - Pager - [phone]  After 6pm go to [email or web address] - password [email or web address] [place]  Office  [phone]  CC: Primary care physician; [name], MD  Note: This dictation was prepared with Dragon dictation along with smaller phrase technology. Any transcriptional errors that result from this process are unintentional.

## 2019-02-22 NOTE — Progress Notes (Signed)
Critical Care Medicine          Date: 02/22/2019,   MRN# [id number] Yolanda Wells 19-Aug-1932     AdmissionWeight: 90 kg                 CurrentWeight: 88.1 kg        SUBJECTIVE    Patient is showing signs of hyperactive delirium.  I was unable to redirect her with encouragement, she refuses to take any sips of water keeping safety mittens over mouth. Denies pain.   558pm - had in person meeting with granddaughter [name] ([place]) and grandson and discussed goals of care.  She favors placing patient on comfort care at home but wants to discuss again with remainder of patients children and get back to [place].     PAST MEDICAL HISTORY   Past Medical History:  Diagnosis Date  . Acute deep vein thrombosis (DVT) of distal vein of left lower extremity ([place]) 10/03/2015  . Acute pulmonary embolism ([place]) 10/03/2015  . Anticoagulant long-term use   . Arthritis   . Arthritis   . CHF (congestive heart failure) ([place])   . Chronic anticoagulation 10/03/2015  . Collagen vascular disease ([place])   . Colostomy in place [facility]) 10/04/2015  . Deep venous thrombosis ([facility])    right lower extremity  . Dependent edema   . Diverticulosis of intestine with bleeding 04/02/2015  . Glenohumeral arthritis 06/28/2012  . Hammertoe 09/02/2012  . History of bilateral hip replacements 09/02/2012  . History of hysterectomy   . Hypertension   . Hypertension   . Hypokalemia   . Inability to ambulate due to hip 10/12/2015  . Mandibular dysfunction   . Onychomycosis 09/02/2012  . Osteoarthritis   . Polycythemia vera ([place])   . Presence of IVC filter 05/25/2015  . Stroke ([place]) 03/26/2015   cerebellar  . Urinary incontinence in female 09/02/2016     SURGICAL HISTORY   Past Surgical History:  Procedure Laterality Date  . ABDOMINAL HYSTERECTOMY    . BLEPHAROPLASTY Right 03/2017   right upper eyelid  . CARDIAC CATHETERIZATION Right 04/03/2015   Procedure: CENTRAL LINE INSERTION;  Surgeon: Sherri Rad, MD;  Location: [facility] ORS;  Service: General;  Laterality: Right;  . COLECTOMY WITH COLOSTOMY CREATION/HARTMANN PROCEDURE N/A 04/03/2015   Procedure: COLECTOMY WITH COLOSTOMY CREATION/HARTMANN PROCEDURE;  Surgeon: Sherri Rad, MD;  Location: [facility] ORS;  Service: General;  Laterality: N/A;  . COLON SURGERY    . FOOT AMPUTATION     partial  . FOOT AMPUTATION Right   . IVC FILTER INSERTION    . JOINT REPLACEMENT     Left and Right Hip  . PERIPHERAL VASCULAR CATHETERIZATION N/A 04/02/2015   Procedure: Visceral Angiography;  Surgeon: Algernon Huxley, MD;  Location: [place] [facility];  Service: Cardiovascular;  Laterality: N/A;  . PERIPHERAL VASCULAR CATHETERIZATION N/A 04/02/2015   Procedure: Visceral Artery Intervention;  Surgeon: Algernon Huxley, MD;  Location: [place] [facility];  Service: Cardiovascular;  Laterality: N/A;  . PERIPHERAL VASCULAR CATHETERIZATION N/A 05/25/2015   Procedure: IVC Filter Insertion;  Surgeon: Katha Cabal, MD;  Location: [place] [facility];  Service: Cardiovascular;  Laterality: N/A;  . TOE AMPUTATION     right  . TOTAL HIP ARTHROPLASTY       FAMILY HISTORY   Family History  Problem Relation Age of Onset  . Brain cancer Mother   . Heart attack Father   . COPD Father   . Coronary  artery disease Father   . Hypertension Other   . Arthritis-Osteo Other   . Diabetes Brother   . Arthritis Brother   . Osteosarcoma Son   . Bone cancer Son   . Arthritis Sister   . Arthritis Brother   . Hypertension Brother   . Coronary artery disease Brother   . Malignant hyperthermia Brother      SOCIAL HISTORY   Social History   Tobacco Use  . Smoking status: Never Smoker  . Smokeless tobacco: Never Used  Substance Use Topics  . Alcohol use: No  . Drug use: No     MEDICATIONS    Home Medication:    Current Medication:  Current Facility-Administered Medications:  .  acetaminophen (TYLENOL) tablet 650 mg, 650 mg, Oral, Q6H PRN **OR** acetaminophen  (TYLENOL) suppository 650 mg, 650 mg, Rectal, Q6H PRN, Sudini, Srikar, MD .  albuterol (PROVENTIL) (2.5 MG/3ML) 0.083% nebulizer solution 2.5 mg, 2.5 mg, Nebulization, Q2H PRN, Sudini, Srikar, MD .  chlorhexidine (PERIDEX) 0.12 % solution 15 mL, 15 mL, Mouth Rinse, BID, Tyler Pita, MD, 15 mL at 02/22/19 0755 .  Chlorhexidine Gluconate Cloth 2 % PADS 6 each, 6 each, Topical, Q0600, Tyler Pita, MD, 6 each at 02/22/19 [phone] .  dexmedetomidine (PRECEDEX) 400 MCG/100ML (4 mcg/mL) infusion, 0.4-1.2 mcg/kg/hr, Intravenous, Titrated, Olusegun Gerstenberger, MD, Last Rate: 8.81 mL/hr at 02/22/19 1438, 0.4 mcg/kg/hr at 02/22/19 1438 .  enoxaparin (LOVENOX) injection 40 mg, 40 mg, Subcutaneous, Q24H, Sudini, Srikar, MD, 40 mg at 02/12/2019 2122 .  haloperidol lactate (HALDOL) injection 1 mg, 1 mg, Intravenous, Q6H PRN, Wilhelmina Mcardle, MD, 1 mg at 02/22/19 1104 .  ipratropium-albuterol (DUONEB) 0.5-2.5 (3) MG/3ML nebulizer solution 3 mL, 3 mL, Nebulization, Q4H PRN, Wilhelmina Mcardle, MD .  LORazepam (ATIVAN) injection 0.5-1 mg, 0.5-1 mg, Intravenous, Q4H PRN, Wilhelmina Mcardle, MD, 1 mg at 02/22/19 1155 .  MEDLINE mouth rinse, 15 mL, Mouth Rinse, q12n4p, Tyler Pita, MD, 15 mL at 02/22/19 1146 .  mupirocin ointment (BACTROBAN) 2 % 1 application, 1 application, Nasal, BID, Tyler Pita, MD, 1 application at 96/04/54 0754 .  [DISCONTINUED] ondansetron (ZOFRAN) tablet 4 mg, 4 mg, Oral, Q6H PRN **OR** ondansetron (ZOFRAN) injection 4 mg, 4 mg, Intravenous, Q6H PRN, Sudini, Srikar, MD .  polyethylene glycol (MIRALAX / GLYCOLAX) packet 17 g, 17 g, Oral, Daily PRN, Sudini, Srikar, MD .  QUEtiapine (SEROQUEL) tablet 50 mg, 50 mg, Oral, QHS, Simonds, David B, MD .  sodium chloride flush (NS) 0.9 % injection 3 mL, 3 mL, Intravenous, Q12H, Sudini, Srikar, MD, 3 mL at 02/22/19 0981    ALLERGIES   Patient has no known allergies.     REVIEW OF SYSTEMS    Review of Systems:  Unable to obtain  due to hallucinations while acutely ill   VS: BP (!) 94/48 (BP Location: Right Arm)   Pulse (!) 103   Temp 98.4 F (36.9 C) (Axillary)   Resp (!) 22   Ht 5\' 2"  (1.575 m)   Wt 88.1 kg   SpO2 98%   BMI 35.52 kg/m      PHYSICAL EXAM    GENERAL:NAD, no fevers, chills, no weakness no fatigue HEAD: Normocephalic, atraumatic.  EYES: Pupils equal, round, reactive to light. Extraocular muscles intact. No scleral icterus.  MOUTH:dry mucosal membrane. Dentition intact. .  EAR, NOSE, THROAT: Clear without exudates. No external lesions.  NECK: Supple. No thyromegaly. No nodules. No JVD.  PULMONARY: patient not allowing me  to auculate chest CARDIOVASCULAR:  Pedal pulses 2+ bilaterally.  GASTROINTESTINAL: Soft, nontender, nondistended. No masses. Positive bowel sounds. No hepatosplenomegaly.  MUSCULOSKELETAL: No swelling, clubbing, or edema. Range of motion full in all extremities.  NEUROLOGIC: Cranial nerves II through XII are intact. No gross focal neurological deficits. Sensation intact. Reflexes intact.  SKIN: No ulceration, lesions, rashes, or cyanosis. Skin warm and dry. Turgor intact.  PSYCHIATRIC: Mood, affect within normal limits. The patient is awake, alert and oriented x 3. Insight, judgment intact.       IMAGING    Dg Chest 1 View  Result Date: 02/14/2019 CLINICAL DATA:  Acute shortness of breath and hypoxia. EXAM: CHEST  1 VIEW COMPARISON:  02/18/2019 chest radiograph, chest CT and prior studies FINDINGS: Cardiomediastinal silhouette is unchanged. RIGHT basilar atelectasis again noted. There is no evidence of focal airspace disease, pulmonary edema, suspicious pulmonary nodule/mass, pleural effusion, or pneumothorax. No acute bony abnormalities are identified. IMPRESSION: RIGHT basilar atelectasis. Electronically Signed   By: Margarette Canada M.D.   On: 03/04/2019 13:39   Ct Head Wo Contrast  Result Date: 02/09/2019 CLINICAL DATA:  85 year old female with altered mental status.  EXAM: CT HEAD WITHOUT CONTRAST TECHNIQUE: Contiguous axial images were obtained from the base of the skull through the vertex without intravenous contrast. COMPARISON:  08/30/2018 MR, 08/30/2018 CT and prior studies FINDINGS: Brain: No evidence of acute infarction, hemorrhage, hydrocephalus, extra-axial collection or mass lesion/mass effect. Atrophy and mild chronic small-vessel white matter ischemic changes noted. Vascular: Atherosclerotic calcifications again noted. Skull: Normal. Negative for fracture or focal lesion. Sinuses/Orbits: No acute finding. Other: None. IMPRESSION: 1. No evidence of acute intracranial abnormality. 2. Atrophy and mild chronic small-vessel white matter ischemic changes. Electronically Signed   By: Margarette Canada M.D.   On: 02/28/2019 12:38   Ct Chest Wo Contrast  Result Date: 02/18/2019 CLINICAL DATA:  Shortness of breath for the last 2 days. History of COPD on 2 L of oxygen, CHF, history of pulmonary embolism and other medical problems. Occasional dry cough. No fever. EXAM: CT CHEST WITHOUT CONTRAST TECHNIQUE: Multidetector CT imaging of the chest was performed following the standard protocol without IV contrast. COMPARISON:  Chest CTs dated 11/22/2018 and 09/21/2018. FINDINGS: Cardiovascular: Aortic atherosclerosis. No thoracic aortic aneurysm. Cardiomegaly. No pericardial effusion seen. Prominent mitral annulus calcifications again noted. Mediastinum/Nodes: No mass or enlarged lymph nodes seen within the mediastinum or perihilar regions. Esophagus is unremarkable. Trachea appears normal. Lungs/Pleura: Chronic consolidation at the RIGHT lung base, not appreciably changed compared to CT abdomen of 12/03/2018, questionably increased compared to earlier chest CT exams. Additional mild chronic appearing atelectasis at the LEFT lung base. Lungs otherwise clear. No pleural effusion or pneumothorax seen. No consolidations or ground-glass opacities to suggest COVID-19 infection. Upper  Abdomen: Large gallstone within the gallbladder, incompletely imaged, also seen on previous exams. RIGHT renal cyst, partially imaged. No acute findings within the upper abdomen. Musculoskeletal: Degenerative change throughout the scoliotic thoracolumbar spine, moderate in degree. Advanced degenerative changes at the bilateral shoulders, incompletely imaged. No acute or suspicious osseous finding. IMPRESSION: 1. No acute findings. No evidence of active pneumonia or pulmonary edema. 2. Chronic consolidation/atelectasis at the RIGHT lung base, stable compared to CT abdomen of 12/03/2018. Mild chronic-appearing atelectasis at the LEFT lung base. 3. Cardiomegaly. No pericardial effusion. 4. Additional chronic/incidental findings detailed above. Aortic Atherosclerosis (ICD10-I70.0). Electronically Signed   By: Franki Cabot M.D.   On: 02/18/2019 14:54   Dg Chest Port 1 View  Result Date: 02/18/2019 CLINICAL  DATA:  Short of breath EXAM: PORTABLE CHEST 1 VIEW COMPARISON:  11/08/2018 FINDINGS: Cardiac enlargement. Mild vascular congestion without edema. Elevated right hemidiaphragm with right lower lobe atelectasis unchanged. Left lung clear. Advanced degenerative change in both shoulders. IMPRESSION: Chronic elevation right hemidiaphragm with right lower lobe atelectasis Mild vascular congestion without edema. Electronically Signed   By: Franchot Gallo M.D.   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      - patient is DNR and goals of care need to be revisited       -Palliative care consultation - spoke to family , plan for repeat discussion in am      -some concern that altered sensorium is due to infection since patient has leukocytosis with left shift and recurrent UTI hx - will order blood and urine culture (non foley urine)      Pulmonary HTN - Group 4?      -hx of recurent dvt/PE secondary to PCV      - not a candidate for PEA , may consider med mgt if OP diagnostic workup is appropriate for this comorbid octogenarian       -on Eliquis for PE   Polycythemia     C/w Ruxolitinib    Multiple comorbid conditions/ DNR code status     - as per Hospitalist team    GI/DVT ppx     protonix , ISS ac/hs , Eliquis         Critical care provider statement:    Critical care time (minutes):  36   Critical care time was exclusive of:  Separately billable procedures and  treating other patients   Critical care was necessary to treat or prevent imminent or  life-threatening deterioration of the following conditions:  Acute hypoxemic respiratory failure, altered mental status, pulmonary hypertension, OSA,    Critical care was time spent personally by me on the following  activities:  Development of treatment plan with patient or surrogate,  discussions with consultants, evaluation of patient's response to  treatment, examination of patient, obtaining history from patient or  surrogate, ordering and performing treatments and interventions, ordering  and review of laboratory studies and re-evaluation of patient's condition   I  assumed direction of critical care for this patient from another  provider in my specialty: no      Ottie Glazier, M.D.  Division of [place]

## 2019-02-22 NOTE — Consult Note (Signed)
Consultation Note Date: 02/22/2019   Patient Name: Yolanda Wells  DOB: 1932/02/20  MRN: [id number]  Age / Sex: 85 y.o., female  PCP: [name], MD Referring Physician: Hillary Bow, MD  Reason for Consultation: Establishing goals of care  HPI/Patient Profile: 85 y.o. female  with past medical history of  heart failure, long-term anticoagulation use, polycythemia vera, collagen vascular disease cerebellar stroke, history of PE with IVC filter, history of DVT left lower extremity / right lower extremity, bilateral hip replacements, osteoarthritis, diverticulosis with intestinal bleeding, colostomy 2016, hypertension, arthritis, partial right foot amputation admitted on 03/10/2019 with acute on chronic hypoxic/hypercarbic respiratory failure likely secondary to acute on chronic diastolic heart failure.  PMT consulted for goals of care, 8 admissions and 3 ED visits in 6 months  Clinical Assessment and Goals of Care: Mrs. Hamor is lying in bed.  She will look in my direction but not make or keep eye contact.  She appears [place] acutely/chronically ill.  She frequently calls out, stating "I am not dying, you are dying".  She cannot be redirected and will frequently have unintelligible speech.  I ask if she would like something to drink, initially she states that she would like water but then she refuses.  She talks about "Logan".  She tells me that he has gone to [place], and he will understand if she does not want water.  There is no family at bedside at this time due to the visitor restrictions.  Mrs. Feltman had been active with [facility] for outpatient palliative services while she was in rehab at [facility].  It seems that palliative services were not continued when she discharged to home at the end of March.  Mrs. Lyter has been hospitalized 8 times with 3 ED visits in the last 6 months.  Mrs.  Tobey lives with [name] in [place] home for 3 years.   Call to granddaughter/responsible party, [name] at [phone].  [name] conferences her uncle Darryl to call.  Mrs. Winstanley also has a daughter Lovey Newcomer who lives in [place].  [name] tells me that Mrs. Gootee had been doing well until this week.  She shares that Mrs. Ogden breathing had gotten worse starting Thursday.  She shares that home health services were in place along with a private duty CNA.  Mrs. Willbanks had been taking nutritional supplements although not eating her best.  [name] shares that she called the MD about checking for urinary tract infection due to respiratory status, confusion.  She shares that on RN came and did an in and out cath, no results at this time.  [name] shares that they also had a virtual call with a MD who suggested that Mrs. Zupko go to the emergency room.  [name] shares that she was discharged from the ED but the primary care doctor sent her back to the hospital on Monday.  We discussed the treatment plan in detail.  We discussed CODE STATUS, see below.  [name] shares that she has discussed transfer to [facility]  with attending.  She is questioning Mrs. Keeling being moved to [facility] because there and could visit her.  [name] shares that she feels part of Mrs. Uhlir confusion may be related to no family present.  We discussed the logistics of transfer including medical need and accepting physician.   We plan for follow-up call tomorrow 4/15.  + The above conversation was completed via telephone due to the visitor restrictions during the COVID-19 pandemic. Thorough chart review and discussion with necessary members of the care team was completed as part of assessment.   HCPOA    NEXT OF KIN -  Grand daughter [name]   SUMMARY OF RECOMMENDATIONS   Continue FULL scope / FULL code  Code Status/Advance Care Planning:  Full code -  Grand daughter [name] tells me that Mrs. Tilmon has decided that  she would want CPR and intubation.  [name] shares that Mrs. Galindez told family that she would not want long term life support.   Symptom Management:   Per hospitalist/CMM, no additional needs at this time.   Palliative Prophylaxis:   Frequent Pain Assessment and Turn Reposition  Additional Recommendations (Limitations, Scope, Preferences):  Full Scope Treatment  Psycho-social/Spiritual:   Desire for further Chaplaincy support:no  Additional Recommendations: Caregiving  Support/Resources and Education on Hospice  Prognosis:   Unable to determine, 3-6 months or less would not be surprising base on chronic illness burden, frailty, multiple hospitalizations.   Discharge Planning: to be determined, based on outcomes.       Primary Diagnoses: Present on Admission:  Acute on chronic respiratory failure ([place])   I have reviewed the medical record, interviewed the patient and family, and examined the patient. The following aspects are pertinent.  Past Medical History:  Diagnosis Date   Acute deep vein thrombosis (DVT) of distal vein of left lower extremity ([place]) 10/03/2015   Acute pulmonary embolism ([place]) 10/03/2015   Anticoagulant long-term use    Arthritis    Arthritis    CHF (congestive heart failure) ([place])    Chronic anticoagulation 10/03/2015   Collagen vascular disease ([place])    Colostomy in place ([place]) 10/04/2015   Deep venous thrombosis ([place])    right lower extremity   Dependent edema    Diverticulosis of intestine with bleeding 04/02/2015   Glenohumeral arthritis 06/28/2012   Hammertoe 09/02/2012   History of bilateral hip replacements 09/02/2012   History of hysterectomy    Hypertension    Hypertension    Hypokalemia    Inability to ambulate due to hip 10/12/2015   Mandibular dysfunction    Onychomycosis 09/02/2012   Osteoarthritis    Polycythemia vera ([place])    Presence of IVC filter 05/25/2015   Stroke ([place]) 03/26/2015    cerebellar   Urinary incontinence in female 09/02/2016   Social History   Socioeconomic History   Marital status: Widowed    Spouse name: Not on file   Number of children: 5   Years of education: Not on file   Highest education level: Not on file  Occupational History   Occupation: retired  Scientist, product/process development strain: Not very hard   Food insecurity:    Worry: Patient refused    Inability: Patient refused   Transportation needs:    Medical: Patient refused    Non-medical: Patient refused  Tobacco Use   Smoking status: Never Smoker   Smokeless tobacco: Never Used  Substance and Sexual Activity   Alcohol use: No   Drug use: No  Sexual activity: Never  Lifestyle   Physical activity:    Days per week: Patient refused    Minutes per session: Patient refused   Stress: Only a little  Relationships   Social connections:    Talks on phone: Patient refused    Gets together: Patient refused    Attends religious service: Patient refused    Active member of club or organization: Patient refused    Attends meetings of clubs or organizations: Patient refused    Relationship status: Patient refused  Other Topics Concern   Not on file  Social History Narrative   Lives at [place] History  Problem Relation Age of Onset   Brain cancer Mother    Heart attack Father    COPD Father    Coronary artery disease Father    Hypertension Other    Arthritis-Osteo Other    Diabetes Brother    Arthritis Brother    Osteosarcoma Son    Bone cancer Son    Arthritis Sister    Arthritis Brother    Hypertension Brother    Coronary artery disease Brother    Malignant hyperthermia Brother    Scheduled Meds:  chlorhexidine  15 mL Mouth Rinse BID   Chlorhexidine Gluconate Cloth  6 each Topical Q0600   enoxaparin (LOVENOX) injection  40 mg Subcutaneous Q24H   mouth rinse  15 mL Mouth Rinse q12n4p   mupirocin ointment  1  application Nasal BID   QUEtiapine  50 mg Oral QHS   sodium chloride flush  3 mL Intravenous Q12H   Continuous Infusions: PRN Meds:.acetaminophen **OR** acetaminophen, albuterol, haloperidol lactate, ipratropium-albuterol, LORazepam, [DISCONTINUED] ondansetron **OR** ondansetron (ZOFRAN) IV, polyethylene glycol Medications Prior to Admission:  Prior to Admission medications   Medication Sig Start Date End Date Taking? Authorizing Provider  amLODipine (NORVASC) 5 MG tablet Take 2 tablets (10 mg total) by mouth daily. 10/31/18  Yes Fritzi Mandes, MD  aspirin EC 81 MG tablet Take 81 mg by mouth daily.   Yes [provider]  cholecalciferol (VITAMIN D) 1000 units tablet Take 1,000 Units by mouth daily.   Yes [provider]  ELIQUIS 5 MG TABS tablet TAKE ONE TABLET BY MOUTH TWICE A DAY Patient taking differently: Take 5 mg by mouth 2 (two) times daily.  11/04/18  Yes Karen Kitchens, NP  estradiol (ESTRACE VAGINAL) 0.1 MG/GM vaginal cream Apply 0.5mg  (pea-sized amount)  just inside the vaginal introitus with a finger-tip on Monday, Wednesday and Friday nights. 08/09/18  Yes McGowan, Larene Beach A, PA-C  folic acid (FOLVITE) 829 MCG tablet Take 400 mcg by mouth daily.   Yes [provider]  gabapentin (NEURONTIN) 300 MG capsule Take 300 mg by mouth at bedtime. 11/23/18  Yes [provider]  ipratropium-albuterol (DUONEB) 0.5-2.5 (3) MG/3ML SOLN Take 3 mLs by nebulization every 4 (four) hours as needed (for shortness of breath/wheezing). 11/10/18  Yes Sudini, Srikar, MD  JAKAFI 15 MG tablet TAKE 1 TABLET (15 MG TOTAL) BY MOUTH 2 TIMES DAILY. 12/27/18  Yes Corcoran, Drue Second, MD  pantoprazole (PROTONIX) 40 MG tablet Take 1 tablet (40 mg total) by mouth daily. 09/12/18  Yes [name], MD  albuterol (PROVENTIL HFA;VENTOLIN HFA) 108 (90 Base) MCG/ACT inhaler Inhale 2 puffs into the lungs every 6 (six) hours as needed for wheezing or shortness of breath. 02/20/19   Gouru,  Illene Silver, MD  docusate sodium (COLACE) 100 MG capsule Take 100 mg by mouth 2 (two) times daily.  [provider]  furosemide (LASIX) 20 MG tablet Take 20 mg by mouth as needed for fluid (TAKING ATLEAST ONCE A WEEK).  12/14/18   [provider]  ondansetron (ZOFRAN ODT) 4 MG disintegrating tablet Take 1 tablet (4 mg total) by mouth every 8 (eight) hours as needed. 11/26/18   Alfred Levins, [place], MD  polyethylene glycol (MIRALAX / GLYCOLAX) 17 g packet Take 17 g by mouth 4 (four) times a week. Monday, Wednesday, Saturday, Sunday    [provider]  potassium chloride SA (K-DUR,KLOR-CON) 20 MEQ tablet Take 20 mEq by mouth as needed (ONLY TAKE WHEN TAKING LASIX).    [provider]  predniSONE (STERAPRED UNI-PAK 21 TAB) 10 MG (21) TBPK tablet Take 1 tablet (10 mg total) by mouth daily. Take 6 tablets by mouth for 1 day followed by  5 tablets by mouth for 1 day followed by  4 tablets by mouth for 1 day followed by  3 tablets by mouth for 1 day followed by  2 tablets by mouth for 1 day followed by  1 tablet by mouth for a day and stop 02/20/19   Nicholes Mango, MD   No Known Allergies Review of Systems  Unable to perform ROS: Dementia    Physical Exam Vitals signs and nursing note reviewed.  Constitutional:      Appearance: She is obese.  HENT:     Head: Normocephalic and atraumatic.  Cardiovascular:     Rate and Rhythm: Normal rate.  Pulmonary:     Effort: Pulmonary effort is normal. No tachypnea or accessory muscle usage.  Abdominal:     Palpations: Abdomen is soft.  Skin:    General: Skin is warm and dry.  Neurological:     Mental Status: She is alert.     Comments: Known dementia  Psychiatric:     Comments: Frequent yelling out, somewhat fearful     Vital Signs: BP (!) 94/48 (BP Location: Right Arm)    Pulse (!) 103    Temp 98.4 F (36.9 C) (Axillary)    Resp (!) 22    Ht 5\' 2"  (1.575 m)    Wt 88.1 kg    SpO2 98%    BMI 35.52 kg/m  Pain Scale:  CPOT   Pain Score: 4    SpO2: SpO2: 98 % O2 Device:SpO2: 98 % O2 Flow Rate: .O2 Flow Rate (L/min): 2 L/min  IO: Intake/output summary:   Intake/Output Summary (Last 24 hours) at 02/22/2019 1407 Last data filed at 02/22/2019 1200 Gross per 24 hour  Intake 451.66 ml  Output 1060 ml  Net -608.34 ml    LBM: Last BM Date: 02/25/2019 Baseline Weight: Weight: 90 kg Most recent weight: Weight: 88.1 kg     Palliative Assessment/Data:   Flowsheet Rows     Most Recent Value  Intake Tab  Referral Department  Hospitalist  Unit at Time of Referral  Med/Surg Unit  Palliative Care Primary Diagnosis  Pulmonary  Date Notified  02/28/2019  Palliative Care Type  Return patient Palliative Care  Reason for referral  Clarify Goals of Care  Date of Admission  03/05/2019  Date first seen by Palliative Care  02/22/19  # of days Palliative referral response time  1 Day(s)  # of days IP prior to Palliative referral  0  Clinical Assessment  Palliative Performance Scale Score  30%  Pain Max last 24 hours  Not able to report  Pain Min Last 24 hours  Not able to  report  Dyspnea Max Last 24 Hours  Not able to report  Dyspnea Min Last 24 hours  Not able to report  Psychosocial & Spiritual Assessment  Palliative Care Outcomes      Time In: 1305 Time Out: 1420 Time Total: 75 minutes   Greater than 50%  of this time was spent counseling and coordinating care related to the above assessment and plan.  Signed by: Drue Novel, NP   Please contact Palliative Medicine Team phone at [phone] for questions and concerns.  For individual provider: See Shea Evans

## 2019-02-22 NOTE — Progress Notes (Signed)
Pastoral Care Visit   02/22/19 1600  Clinical Encounter Type  Visited With Health care provider;Patient;Family  Visit Type Initial  Referral From Physician  Consult/Referral To Chaplain   Based on conv w/ Dr Lanney Gins, chap felt it best to involve pt's granddaughter who has HCPOA for pt if poss for a decision to poss move pt to comfort care.  Chap attempted to contact palliative medicine and left mssg. Melven Sartorius called granddaughter who agreed to come to [place] within hr. Chap notified beside RN, receptionist, MD, and front desk to allow Granddaughter to visit pt.  [employer], [place], Chaplain

## 2019-02-23 DIAGNOSIS — R451 Restlessness and agitation: Secondary | ICD-10-CM

## 2019-02-23 DIAGNOSIS — J449 Chronic obstructive pulmonary disease, unspecified: Secondary | ICD-10-CM

## 2019-02-23 DIAGNOSIS — R410 Disorientation, unspecified: Secondary | ICD-10-CM

## 2019-02-23 DIAGNOSIS — G9340 Encephalopathy, unspecified: Secondary | ICD-10-CM

## 2019-02-23 DIAGNOSIS — R8281 Pyuria: Secondary | ICD-10-CM

## 2019-02-23 LAB — TROPONIN I: Troponin I: 0.07 ng/mL (ref <0.03)

## 2019-02-23 LAB — URINE CULTURE: Special Requests: NORMAL

## 2019-02-23 LAB — MAGNESIUM: Magnesium: 1.9 mg/dL (ref 1.7–2.4)

## 2019-02-23 LAB — PHOSPHORUS: Phosphorus: 3 mg/dL (ref 2.5–4.6)

## 2019-02-23 LAB — GLUCOSE, CAPILLARY: Glucose-Capillary: 93 mg/dL (ref 70–99)

## 2019-02-23 LAB — POTASSIUM: Potassium: 3.4 mmol/L — ABNORMAL LOW (ref 3.5–5.1)

## 2019-02-23 MED ORDER — DILTIAZEM HCL ER 60 MG PO CP12
120.0000 mg | ORAL_CAPSULE | Freq: Two times a day (BID) | ORAL | Status: DC
  Administered 2019-02-23 (×2): 120 mg via ORAL
  Filled 2019-02-23 (×4): qty 2

## 2019-02-23 MED ORDER — POTASSIUM CHLORIDE 2 MEQ/ML IV SOLN
INTRAVENOUS | Status: DC
  Administered 2019-02-23: 17:00:00 via INTRAVENOUS
  Filled 2019-02-23 (×3): qty 1000

## 2019-02-23 MED ORDER — POTASSIUM CHLORIDE 10 MEQ/100ML IV SOLN
10.0000 meq | INTRAVENOUS | Status: AC
  Administered 2019-02-23 (×4): 10 meq via INTRAVENOUS
  Filled 2019-02-23 (×4): qty 100

## 2019-02-23 MED ORDER — METOPROLOL TARTRATE 5 MG/5ML IV SOLN
5.0000 mg | INTRAVENOUS | Status: AC
  Administered 2019-02-23: 16:00:00 5 mg via INTRAVENOUS

## 2019-02-23 MED ORDER — METOPROLOL TARTRATE 5 MG/5ML IV SOLN
5.0000 mg | INTRAVENOUS | Status: AC
  Administered 2019-02-23: 5 mg via INTRAVENOUS

## 2019-02-23 MED ORDER — ADENOSINE 12 MG/4ML IV SOLN
6.0000 mg | Freq: Once | INTRAVENOUS | Status: AC
  Administered 2019-02-23: 13:00:00 6 mg via INTRAVENOUS

## 2019-02-23 MED ORDER — ADENOSINE 12 MG/4ML IV SOLN
6.0000 mg | Freq: Once | INTRAVENOUS | Status: AC
  Administered 2019-02-23: 6 mg via INTRAVENOUS

## 2019-02-23 MED ORDER — SODIUM CHLORIDE 0.9 % IV SOLN
1.0000 g | INTRAVENOUS | Status: DC
  Administered 2019-02-23 – 2019-02-26 (×4): 1 g via INTRAVENOUS
  Filled 2019-02-23 (×3): qty 1
  Filled 2019-02-23: qty 10

## 2019-02-23 MED ORDER — SODIUM CHLORIDE 0.9 % IV SOLN
0.0000 ug/min | INTRAVENOUS | Status: DC
  Filled 2019-02-23: qty 1

## 2019-02-23 MED ORDER — ADENOSINE 12 MG/4ML IV SOLN
INTRAVENOUS | Status: AC
  Filled 2019-02-23: qty 4

## 2019-02-23 MED ORDER — METOPROLOL TARTRATE 5 MG/5ML IV SOLN
2.5000 mg | INTRAVENOUS | Status: DC | PRN
  Administered 2019-02-24: 12:00:00 5 mg via INTRAVENOUS
  Administered 2019-02-24: 2.5 mg via INTRAVENOUS
  Administered 2019-02-25: 05:00:00 5 mg via INTRAVENOUS
  Filled 2019-02-23 (×5): qty 5

## 2019-02-23 NOTE — Progress Notes (Signed)
Intermittently agitated.  Very poorly oriented.  Occasionally combative.  Episode of PSVT this early afternoon.  Broke with adenosine.  Vitals:   02/23/19 0500 02/23/19 0600 02/23/19 0700 02/23/19 0800  BP: 128/60 (!) 129/58 (!) 123/59 (!) 154/92  Pulse: 62 (!) 57 (!) 57 84  Resp: 17 17 17 20   Temp:    98.1 F (36.7 C)  TempSrc:    Axillary  SpO2: 100% 100% 100% 96%  Weight:      Height:      2 LPM   Gen: Agitated, no respiratory distress HEENT: NCAT, sclerae white Neck: No JVD noted Lungs: breath sounds diminished without wheezes Cardiovascular: tachy, reg, no M noted Abdomen: Soft, nontender, normal BS Ext: Warm without clubbing, cyanosis, edema Neuro: Hands intact, no focal deficits noted  Skin: Limited exam, no lesions noted   BMP Latest Ref Rng & Units 02/22/2019 03/05/2019 02/19/2019  Glucose 70 - 99 mg/dL 101(H) 120(H) 124(H)  BUN 8 - 23 mg/dL 18 17 11   Creatinine 0.44 - 1.00 mg/dL 0.54 0.48 0.39(L)  Sodium 135 - 145 mmol/L 145 142 143  Potassium 3.5 - 5.1 mmol/L 3.3(L) 3.9 4.0  Chloride 98 - 111 mmol/L 100 105 105  CO2 22 - 32 mmol/L 38(H) 33(H) 34(H)  Calcium 8.9 - 10.3 mg/dL 8.3(L) 8.4(L) 8.6(L)   CBC Latest Ref Rng & Units 02/22/2019 03/10/2019 02/19/2019  WBC 4.0 - 10.5 K/uL 8.1 16.2(H) 7.2  Hemoglobin 12.0 - 15.0 g/dL 8.5(L) 9.5(L) 8.3(L)  Hematocrit 36.0 - 46.0 % 31.0(L) 35.0(L) 29.8(L)  Platelets 150 - 400 K/uL 193 279 202   CXR: No new film  IMPRESSION: Acute on chronic hypoxemic respiratory failure History of severe COPD Pulmonary hypertension, likely group 4 Pyuria, likely UTI PSVT (episode 04/15 - responded to adenosine) Probable underlying dementia Acute encephalopathy/agitated delirium   PLAN/REC: Needs to remain in SDU until agitation improves We will try to manage off of dexmedetomidine Continue supplemental oxygen as needed Continue bronchodilators as needed Ceftriaxone day 1 of ? Discontinue amlodipine and changed to diltiazem (for  PSVT) Low-dose IV metoprolol as needed to maintain HR <115/min Low-dose haloperidol as needed   I had a long discussion with patient's granddaughter who is her primary caretaker at home beginning to discuss goals of care.  The patient's son was on the call as well but he did not have much to add to the conversation.  Patient was on hospice prior to this admission.  Her granddaughter stated that this was so that they could obtain the many benefits that hospice brings to in home care.  We began a discussion of goals of care.  I encouraged that we not undertake mechanical ventilation or ACLS under any conditions as these would not lead to favorable outcomes for this patient.  They will discuss this further among themselves.  They wish to be able to visit her in person.  I will see if I can make this happen.  Merton Border, MD PCCM service Mobile [phone] Pager [phone] 02/23/2019 2:06 PM

## 2019-02-23 NOTE — TOC Initial Note (Signed)
Transition of Care [facility]) - Initial/Assessment Note    Patient Details  Name: Yolanda Wells MRN: [id number] Date of Birth: 02/28/1932  [facility]) CM/SW Contact:    Beverly Sessions, RN Phone Number: 02/23/2019, 2:48 PM  Clinical Narrative:                  Assessment completed with granddaughter Yolanda Wells via telephone.  Patient lives at home with granddaughter.   PCP [place].  [facility] for transportation.  Denies any issues with transportation  Patient is open with [facility] home health for RN and PT. Yolanda Wells with [facility] notified of admission.     Patient was previously at [facility], and was followed by outpatient palliative at that time.  Yolanda Wells with outpatient palliative to determine if patient is currently being followed in the home.   Patient has chronic O2 with [employer], hospital bed, 2 wheelchairs, motorized chair (battery doesn't work), RW, and a Civil engineer, contracting.  Granddaughter states they will likely need a hoyer lift at discharge.   Patient has a private duty aide that comes 4 days a week, 7-8 hours a day  Palliative has been following inpatient.  It is noted that family may consider comfort care.  Patient's children are [facility] today, and will be at the hospital tomorrow.  Per granddaughter, "if we do go hospice I know we don't [facility] hospice.  If I want to call 911 I want to be able to call 911.  She has insurance so she should be able to go to the hospital.  They always just want me to up the dose of PO lasix and give her morphine instead of brining her in to the hospital".  RNCM discussed with granddaughter that the goal of hospice regardless of the agency would be to keep the patient at home, and comfortable.  RNCM encouraged granddaughter that if her goal is for aggressive treatment and to bring the patient to the hospital anytime there is an acute issue she needs to make that known during the goals of care meeting.    Expected Discharge Plan:  [place] Barriers to Discharge: Continued Medical Work up   Patient Goals and CMS Choice Patient states their goals for this hospitalization and ongoing recovery are:: per grandaughter "ive i need to be able to call 911 to get her treatment I want to be able to do that"      Expected Discharge Plan and Services Expected Discharge Plan: [place] arrangements for the past 2 months: [place] Agency: [place]  Prior Living Arrangements/Services Living arrangements for the past 2 months: Single Family Home Lives with:: Relatives                   Activities of Daily Living Home Assistive Devices/Equipment: Wheelchair ADL Screening (condition at time of admission) Patient's cognitive ability adequate to safely complete daily activities?: No Is the patient deaf or have difficulty hearing?: No Does the patient have difficulty seeing, even when wearing glasses/contacts?: Yes Does the patient have difficulty concentrating, remembering, or making decisions?: Yes Patient able to express need for assistance with ADLs?: No Does the patient have difficulty dressing or bathing?: Yes Independently performs ADLs?: No Communication: Dependent Is this a change from  baseline?: Change from baseline, expected to last <3 days Dressing (OT): Dependent Is this a change from baseline?: Pre-admission baseline Grooming: Dependent Is this a change from baseline?: Pre-admission baseline Feeding: Needs assistance Is this a change from baseline?: Pre-admission baseline Bathing: Dependent Is this a change from baseline?: Pre-admission baseline Toileting: Dependent Is this a change from baseline?: Pre-admission baseline In/Out Bed: Dependent Is this a change from baseline?: Pre-admission baseline Walks in Home: Dependent Is this a change from baseline?: Pre-admission baseline Does the  patient have difficulty walking or climbing stairs?: Yes Weakness of Legs: Both Weakness of Arms/Hands: Both  Permission Sought/Granted                  Emotional Assessment              Admission diagnosis:  Acute respiratory failure with hypoxia and hypercapnia (HCC) [J96.01, J96.02] Altered mental status, unspecified altered mental status type [R41.82] Patient Active Problem List   Diagnosis Date Noted  . Acute respiratory failure with hypoxia and hypercapnia (HCC)   . Acute on chronic respiratory failure ([place]) 03/01/2019  . Decreased appetite 02/02/2019  . Poor appetite 12/24/2018  . Unsteady 12/24/2018  . Palliative care encounter 12/24/2018  . SOB (shortness of breath) 10/28/2018  . Shortness of breath 09/27/2018  . COPD exacerbation ([place]) 09/22/2018  . Sepsis ([place]) 09/22/2018  . Lower GI bleed 09/09/2018  . AMS (altered mental status) 08/30/2018  . Pressure injury of skin 08/13/2018  . B12 deficiency 08/04/2018  . Anemia 06/07/2018  . Secondary myelofibrosis ([place]) 02/17/2018  . Goals of care, counseling/discussion 12/23/2017  . Palliative care by specialist   . DNR (do not resuscitate) discussion   . Weakness generalized   . Chronic diastolic CHF (congestive heart failure) ([place]) 07/28/2017  . Vascular dementia without behavioral disturbance ([place]) 07/08/2017  . Influenza with respiratory manifestation 12/01/2016  . Respiratory distress 11/23/2016  . Urinary incontinence in female 09/02/2016  . Vision loss of right eye 08/26/2016  . Blindness of right eye 08/26/2016  . Inability to ambulate due to hip 10/12/2015  . Colostomy in place [facility]) 10/04/2015  . Long term current use of anticoagulant therapy 10/03/2015  . Leukocytosis 09/25/2015  . CAP (community acquired pneumonia) 09/24/2015  . COPD (chronic obstructive pulmonary disease) ([place]) 09/24/2015  . UTI (urinary tract infection) 07/16/2015  . Abdominal wall cellulitis 07/15/2015  . DVT (deep venous  thrombosis) ([place]) 07/15/2015  . HTN (hypertension) 07/15/2015  . Polycythemia vera ([place]) 07/15/2015  . Arthritis 07/15/2015  . Pulmonary emboli ([place]) 05/25/2015  . Diverticulosis of colon with hemorrhage 04/02/2015  . Diverticulosis of intestine with bleeding 04/02/2015  . Fall 01/10/2015  . Osteoarthritis 01/10/2015  . Hammertoe 09/02/2012  . S/P transmetatarsal amputation of foot ([place]) 09/02/2012  . Onychomycosis 09/02/2012  . Other specified dermatoses 09/02/2012  . S/P hip replacement 08/23/2012  . Glenohumeral arthritis 06/28/2012   PCP:  [name], MD Pharmacy:   [facility], [place] [address] Phone: [phone] Fax: [phone]  [facility] [place], [place] [address] Phone: [phone] Fax: [phone]  [facility] [place], [place] [address] [place] [address] Phone: [phone] Fax: [place], [place]  [address] Phone: [phone] Fax: [phone]     Social Determinants of Health (SDOH) Interventions    Readmission Risk Interventions Readmission Risk Prevention Plan 02/23/2019 09/25/2018 09/01/2018  Transportation Screening Complete Complete Complete  Medication Review Press photographer) Complete Complete Complete  PCP or Specialist appointment within 3-5 days of discharge - Complete Complete  HRI or Home Care Consult Complete Complete Complete  SW Recovery Care/Counseling Consult - Complete Complete  Palliative Care Screening Complete Complete Complete  Medication Reconcilation (Pharmacy) - Complete Complete  [place] Not Applicable Complete Not Complete  Some recent data might be hidden

## 2019-02-23 NOTE — Progress Notes (Signed)
Palliative:   Yolanda Wells is resting quietly, there is no family at bedside at this time dt visitor restrictions.    Conference with CCM related to [place] and family meetings.  Call from nursing staff who shares that family can be available tonight around 8 pm and would like to come to the hospital to see patient. Nursing staff encouraged to request video call with E-link as Yolanda Wells is NOT actively dying and Covid visitor restrictions are in place.    Call to granddaughter Yolanda Wells. We talk about her conversation with CCM doctor.  Yolanda Wells tells me that she understands from her conversation with doctor that Yolanda Wells breathing is back to baseline.   Yolanda Wells shares that she and family would like to have dual meeting with palliative team and CMM tomorrow.  She tells me that outpatient palliative services would be welcomet. No further questions at this time.    Yolanda Wells would like a phone conference with PMT 4/16 to further discuss [place].  She tells me that she is available anytime, and we tentatively set a phone conference for 10:30am.   35 minutes  Yolanda Axe, NP  Palliative Medicine Team  Team Phone # [phone]  Greater than 50% of this time was spent counseling and coordinating care related to the above assessment and plan.

## 2019-02-23 NOTE — Progress Notes (Signed)
Physician Communication   In regards to ICU delirium with antipsychotic therapy  Oral Options 1. Risperidone PO (solution or ODT) 1-2 mg BID Literature: J Clin Psych 2004; 65(3). Pharmacy has ODT 0.5 mg, 1 mg and Solution 1 mg  2. Olanzapine 5 mg PO (ODT) daily Literature: Protivin. 2004; 30: [place] has ODT 5 mg  IM 1. Ziprasidone 10 mg IM q2h or 20 mg IM q4h (max daily dose 40 mg) Literature: NEJM. 208; 379: 5072-2575. In [place] pyxis 20 mg   Paticia Stack, PharmD Pharmacy Resident  02/23/2019 11:22 AM

## 2019-02-24 DIAGNOSIS — I471 Supraventricular tachycardia: Secondary | ICD-10-CM

## 2019-02-24 LAB — CBC
HCT: 30.3 % — ABNORMAL LOW (ref 36.0–46.0)
Hemoglobin: 8.3 g/dL — ABNORMAL LOW (ref 12.0–15.0)
MCH: 23 pg — ABNORMAL LOW (ref 26.0–34.0)
MCHC: 27.4 g/dL — ABNORMAL LOW (ref 30.0–36.0)
MCV: 83.9 fL (ref 80.0–100.0)
Platelets: 177 10*3/uL (ref 150–400)
RBC: 3.61 MIL/uL — ABNORMAL LOW (ref 3.87–5.11)
RDW: 30.6 % — ABNORMAL HIGH (ref 11.5–15.5)
WBC: 8.9 10*3/uL (ref 4.0–10.5)
nRBC: 1.5 % — ABNORMAL HIGH (ref 0.0–0.2)

## 2019-02-24 LAB — BASIC METABOLIC PANEL
Anion gap: 11 (ref 5–15)
BUN: 14 mg/dL (ref 8–23)
CO2: 29 mmol/L (ref 22–32)
Calcium: 7.9 mg/dL — ABNORMAL LOW (ref 8.9–10.3)
Chloride: 103 mmol/L (ref 98–111)
Creatinine, Ser: 0.45 mg/dL (ref 0.44–1.00)
GFR calc Af Amer: >60 mL/min (ref >60)
GFR calc non Af Amer: >60 mL/min (ref >60)
Glucose, Bld: 119 mg/dL — ABNORMAL HIGH (ref 70–99)
Potassium: 4 mmol/L (ref 3.5–5.1)
Sodium: 143 mmol/L (ref 135–145)

## 2019-02-24 LAB — TROPONIN I: Troponin I: 0.08 ng/mL (ref <0.03)

## 2019-02-24 LAB — MAGNESIUM: Magnesium: 1.8 mg/dL (ref 1.7–2.4)

## 2019-02-24 MED ORDER — ADENOSINE 12 MG/4ML IV SOLN
6.0000 mg | Freq: Once | INTRAVENOUS | Status: AC
  Administered 2019-02-24: 6 mg via INTRAVENOUS

## 2019-02-24 MED ORDER — DEXTROSE 5 % IV SOLN
INTRAVENOUS | Status: DC
  Administered 2019-02-24: 17:00:00 via INTRAVENOUS

## 2019-02-24 MED ORDER — DILTIAZEM HCL 100 MG IV SOLR: 5.0000 mg/h | INTRAVENOUS | Status: DC

## 2019-02-24 MED ORDER — METOPROLOL TARTRATE 5 MG/5ML IV SOLN
5.0000 mg | Freq: Once | INTRAVENOUS | Status: AC
  Administered 2019-02-24: 04:00:00 5 mg via INTRAVENOUS

## 2019-02-24 MED ORDER — ADENOSINE 12 MG/4ML IV SOLN
INTRAVENOUS | Status: AC
  Administered 2019-02-24: 03:00:00 6 mg via INTRAVENOUS
  Filled 2019-02-24: qty 4

## 2019-02-24 MED ORDER — BLISTEX MEDICATED EX OINT
TOPICAL_OINTMENT | CUTANEOUS | Status: DC | PRN
  Filled 2019-02-24: qty 6.3

## 2019-02-24 MED ORDER — ADENOSINE 12 MG/4ML IV SOLN
6.0000 mg | Freq: Once | INTRAVENOUS | Status: AC
  Administered 2019-02-24: 03:00:00 6 mg via INTRAVENOUS

## 2019-02-24 MED ORDER — METOPROLOL TARTRATE 5 MG/5ML IV SOLN: 5.0000 mg | Freq: Four times a day (QID) | INTRAVENOUS | Status: DC

## 2019-02-24 NOTE — Progress Notes (Signed)
[facility] at [place] NAME: Yolanda Wells    MR#:  [id number]  DATE OF BIRTH:  01-May-1932  SUBJECTIVE:  CHIEF COMPLAINT:   Chief Complaint  Patient presents with  . Altered Mental Status  . Shortness of Breath   Confused and On O2  REVIEW OF SYSTEMS:    Review of Systems  Unable to perform ROS: Mental status change    DRUG ALLERGIES:  No Known Allergies  VITALS:  Blood pressure (!) 146/104, pulse 77, temperature (!) 97.5 F (36.4 C), temperature source Axillary, resp. rate (!) 25, height 5\' 2"  (1.575 m), weight 88.1 kg, SpO2 100 %.  PHYSICAL EXAMINATION:   Physical Exam  GENERAL:  85 y.o.-year-old patient lying in the bed , obese EYES: Pupils equal, round, reactive to light and accommodation. No scleral icterus. Extraocular muscles intact.  HEENT: Head atraumatic, normocephalic. Oropharynx and nasopharynx clear.  NECK:  Supple, no jugular venous distention. No thyroid enlargement, no tenderness.  LUNGS: Normal breath sounds bilaterally, no wheezing, rales, rhonchi. No use of accessory muscles of respiration.  CARDIOVASCULAR: S1, S2 normal. No murmurs, rubs, or gallops.  ABDOMEN: Soft, nontender, nondistended. Bowel sounds present. No organomegaly or mass.  EXTREMITIES: No cyanosis, clubbing or edema b/l.    PSYCHIATRIC: The patient is drowzy SKIN: No obvious rash, lesion, or ulcer.   LABORATORY PANEL:   CBC Recent Labs  Lab 02/24/19 0327  WBC 8.9  HGB 8.3*  HCT 30.3*  PLT 177   ------------------------------------------------------------------------------------------------------------------ Chemistries  Recent Labs  Lab 02/20/2019 1206  02/24/19 0327  NA 142   < > 143  K 3.9   < > 4.0  CL 105   < > 103  CO2 33*   < > 29  GLUCOSE 120*   < > 119*  BUN 17   < > 14  CREATININE 0.48   < > 0.45  CALCIUM 8.4*   < > 7.9*  MG  --    < > 1.8  AST 17  --   --   ALT 10  --   --   ALKPHOS 66  --   --   BILITOT 0.9  --    --    < > = values in this interval not displayed.   ------------------------------------------------------------------------------------------------------------------  Cardiac Enzymes Recent Labs  Lab 02/24/19 0327  TROPONINI 0.08*   ------------------------------------------------------------------------------------------------------------------  RADIOLOGY:  No results found.   ASSESSMENT AND PLAN:   * Acute on chronic hypoxic hypercarbic respiratory failure likely secondary to acute on chronic diastolic congestive heart failure Given IV lasix on admission with no significant UOP Monitor I/Os Patient does have poor respiratory performance at baseline. Patient has not left her house.  Afebrile.  Unlikely to be COVID-19.  Discussed with Dr. Alva Garnet  *Acute metabolic encephalopathy secondary to hypercarbia.  Does seem to have baseline mild cognitive impairment. Improving  *COPD.  No wheezing.  Nebulizers and inhalers ordered.  *DVT prophylaxis Patient is on Eliquis at home with history of recurrent DVTs.   Palliative care consulted and family meeting setup.  All the records are reviewed and case discussed with Care Management/Social Worker Management plans discussed with the patient, family and they are in agreement.  CODE STATUS: FULL CODE  DVT Prophylaxis: SCDs  TOTAL TIME TAKING CARE OF THIS PATIENT: 35 minutes.   POSSIBLE D/C IN 1-2 DAYS, DEPENDING ON CLINICAL CONDITION.  Neita Carp M.D on 02/24/2019 at 12:28 PM  Between 7am to 6pm -  Pager - [phone]  After 6pm go to [email or web address] - password [email or web address] [facility]  Office  [phone]  CC: Primary care physician; [name], MD  Note: This dictation was prepared with Dragon dictation along with smaller phrase technology. Any transcriptional errors that result from this process are unintentional.

## 2019-02-24 NOTE — Progress Notes (Signed)
[facility] at [place] NAME: Yolanda Wells    MR#:  [id number]  DATE OF BIRTH:  [birth year]  SUBJECTIVE:  CHIEF COMPLAINT:   Chief Complaint  Patient presents with  . Altered Mental Status  . Shortness of Breath   Confused.  Episodes of SVT.  Adenosine and metoprolol given.   REVIEW OF SYSTEMS:    Review of Systems  Unable to perform ROS: Mental status change    DRUG ALLERGIES:  No Known Allergies  VITALS:  Blood pressure (!) 146/104, pulse 77, temperature (!) 97.5 F (36.4 C), temperature source Axillary, resp. rate (!) 25, height 5\' 2"  (1.575 m), weight 88.1 kg, SpO2 100 %.  PHYSICAL EXAMINATION:   Physical Exam  GENERAL:  85 y.o.-year-old patient lying in the bed , obese EYES: Pupils equal, round, reactive to light and accommodation. No scleral icterus. Extraocular muscles intact.  HEENT: Head atraumatic, normocephalic. Oropharynx and nasopharynx clear.  NECK:  Supple, no jugular venous distention. No thyroid enlargement, no tenderness.  LUNGS: Normal breath sounds bilaterally, no wheezing, rales, rhonchi. No use of accessory muscles of respiration.  CARDIOVASCULAR: S1, S2 normal. No murmurs, rubs, or gallops.  ABDOMEN: Soft, nontender, nondistended. Bowel sounds present. No organomegaly or mass.  EXTREMITIES: No cyanosis, clubbing or edema b/l.    PSYCHIATRIC: The patient is drowzy and confused SKIN: No obvious rash, lesion, or ulcer.   LABORATORY PANEL:   CBC Recent Labs  Lab 02/24/19 0327  WBC 8.9  HGB 8.3*  HCT 30.3*  PLT 177   ------------------------------------------------------------------------------------------------------------------ Chemistries  Recent Labs  Lab 02/18/2019 1206  02/24/19 0327  NA 142   < > 143  K 3.9   < > 4.0  CL 105   < > 103  CO2 33*   < > 29  GLUCOSE 120*   < > 119*  BUN 17   < > 14  CREATININE 0.48   < > 0.45  CALCIUM 8.4*   < > 7.9*  MG  --    < > 1.8  AST 17  --   --    ALT 10  --   --   ALKPHOS 66  --   --   BILITOT 0.9  --   --    < > = values in this interval not displayed.   ------------------------------------------------------------------------------------------------------------------  Cardiac Enzymes Recent Labs  Lab 02/24/19 0327  TROPONINI 0.08*   ------------------------------------------------------------------------------------------------------------------  RADIOLOGY:  No results found.   ASSESSMENT AND PLAN:   * Acute on chronic hypoxic hypercarbic respiratory failure likely secondary to acute on chronic diastolic congestive heart failure and COPD Given IV lasix on admission with no significant UOP Monitor I/Os Patient does have poor respiratory performance at baseline. Patient has not left her house.  Afebrile.  Unlikely to be COVID-19.    *Acute metabolic encephalopathy secondary to hypercarbia.  Does seem to have baseline mild cognitive impairment. Seems to be worsening over time  *COPD.  No wheezing.  Nebulizers and inhalers ordered.  *DVT prophylaxis Patient is on Eliquis at home with history of recurrent DVTs.    Palliative care consulted and family meeting setup for today  I discussed with Granddaughter earlier and advised DNR/DNI and Hospice. She is requesting FULL CODE with [facility] care  All the records are reviewed and case discussed with Care Management/Social Worker Management plans discussed with the patient, family and they are in agreement.  CODE STATUS: FULL CODE  TOTAL TIME TAKING CARE  OF THIS PATIENT: 35 minutes.   POSSIBLE D/C IN 1-2 DAYS, DEPENDING ON CLINICAL CONDITION.  [name] Adrea Sherpa M.D on 02/24/2019 at 12:30 PM  Between 7am to 6pm - Pager - [phone]  After 6pm go to [email or web address] - password [email or web address] [facility]  Office  [phone]  CC: Primary care physician; [name], MD  Note: This dictation was prepared with Dragon dictation along with smaller  phrase technology. Any transcriptional errors that result from this process are unintentional.

## 2019-02-24 NOTE — Progress Notes (Signed)
Remains encephalopathic with intermittent agitation and poor cooperation. Sometimes agitated. Not taking POs (refusing). Recurrent episodes of PSVT - breaks with adenosine and with metoprolol  Vitals:   02/24/19 0530 02/24/19 0600 02/24/19 0800 02/24/19 0900  BP:  114/65 107/61 (!) 146/104  Pulse: 70 64 71 77  Resp: (!) 24 20 (!) 31 (!) 25  Temp:   (!) 97.5 F (36.4 C)   TempSrc:   Axillary   SpO2: 99% 99% 92% 100%  Weight:      Height:      2 LPM   Gen: NAD HEENT: NCAT, sclerae white Neck: No JVD noted Lungs: breath sounds diminished without wheezes Cardiovascular: RRR, no M Abdomen: Soft, nontender, normal BS Ext: Warm without clubbing, cyanosis, edema Neuro: CNs intact, no focal deficits Skin: Limited exam, no lesions noted   BMP Latest Ref Rng & Units 02/24/2019 02/23/2019 02/22/2019  Glucose 70 - 99 mg/dL 119(H) - 101(H)  BUN 8 - 23 mg/dL 14 - 18  Creatinine 0.44 - 1.00 mg/dL 0.45 - 0.54  Sodium 135 - 145 mmol/L 143 - 145  Potassium 3.5 - 5.1 mmol/L 4.0 3.4(L) 3.3(L)  Chloride 98 - 111 mmol/L 103 - 100  CO2 22 - 32 mmol/L 29 - 38(H)  Calcium 8.9 - 10.3 mg/dL 7.9(L) - 8.3(L)   CBC Latest Ref Rng & Units 02/24/2019 02/22/2019 03/08/2019  WBC 4.0 - 10.5 K/uL 8.9 8.1 16.2(H)  Hemoglobin 12.0 - 15.0 g/dL 8.3(L) 8.5(L) 9.5(L)  Hematocrit 36.0 - 46.0 % 30.3(L) 31.0(L) 35.0(L)  Platelets 150 - 400 K/uL 177 193 279   CXR: No new film  IMPRESSION: Acute on chronic hypoxemic respiratory failure History of severe COPD Pulmonary hypertension, likely group 4 Pyuria, likely UTI Recurrent PSVT  Refusing PO meds  Probable underlying dementia Acute encephalopathy/agitated delirium   PLAN/REC: Cont SDU through today at least Continue supplemental oxygen as needed Continue bronchodilators as needed Ceftriaxone day 2 of 5 Change diltiazem to IV infusion until willing to take PO meds Cont low-dose IV metoprolol as needed to maintain HR <115/min Cont low-dose haloperidol as  needed   04/15:  I had a long discussion with patient's granddaughter who is her primary caretaker at home beginning to discuss goals of care.  The patient's son was on the call as well but he did not have much to add to the conversation.  Patient was on hospice prior to this admission.  Her granddaughter stated that this was so that they could obtain the many benefits that hospice brings to in home care.  We began a discussion of goals of care.  I encouraged that we not undertake mechanical ventilation or ACLS under any conditions as these would not lead to favorable outcomes for this patient.  They will discuss this further among themselves.  They wish to be able to visit her in person.  I will see if I can make this happen.  04/16: RN spoke with granddaughter Sports administrator). Family still expects full code  Merton Border, MD PCCM service Mobile [phone] Pager [phone] 02/24/2019 11:56 AM

## 2019-02-24 NOTE — Progress Notes (Signed)
Assisted tele visit to patient with family member.  Katlen Seyer R, RN  

## 2019-02-24 NOTE — Consult Note (Signed)
Reason for Consult: Encephalopathy SVT tachycardia possible dementia Referring Physician: Dr. Gilberto Better  intensivist  Yolanda Wells is an 85 y.o. female.  HPI: Patient history of polycythemia pulmonary embolus DVT on chronic anticoagulation chronic hypoxic respiratory failure on 2 L of O2 chronic diastolic congestive heart failure hypertension brought in emergency room with shortness of breath and confusion patient was also somnolent.  Patient was discharged from the hospital yesterday after being treated for COPD exacerbation.  Patient found to be tachypneic breathing 30 times a minute drowsy elevated CO2 chest x-ray was nonacute patient also looks a bit more confused.  Patient appears to be demented.  Appears to be encephalopathic but denies any pain  Past Medical History:  Diagnosis Date  . Acute deep vein thrombosis (DVT) of distal vein of left lower extremity ([place]) 10/03/2015  . Acute pulmonary embolism ([place]) 10/03/2015  . Anticoagulant long-term use   . Arthritis   . Arthritis   . CHF (congestive heart failure) ([place])   . Chronic anticoagulation 10/03/2015  . Collagen vascular disease ([place])   . Colostomy in place [facility]) 10/04/2015  . Deep venous thrombosis ([facility])    right lower extremity  . Dependent edema   . Diverticulosis of intestine with bleeding 04/02/2015  . Glenohumeral arthritis 06/28/2012  . Hammertoe 09/02/2012  . History of bilateral hip replacements 09/02/2012  . History of hysterectomy   . Hypertension   . Hypertension   . Hypokalemia   . Inability to ambulate due to hip 10/12/2015  . Mandibular dysfunction   . Onychomycosis 09/02/2012  . Osteoarthritis   . Polycythemia vera ([place])   . Presence of IVC filter 05/25/2015  . Stroke ([place]) 03/26/2015   cerebellar  . Urinary incontinence in female 09/02/2016    Past Surgical History:  Procedure Laterality Date  . ABDOMINAL HYSTERECTOMY    . BLEPHAROPLASTY Right 03/2017   right upper eyelid  . CARDIAC CATHETERIZATION  Right 04/03/2015   Procedure: CENTRAL LINE INSERTION;  Surgeon: Sherri Rad, MD;  Location: [facility] ORS;  Service: General;  Laterality: Right;  . COLECTOMY WITH COLOSTOMY CREATION/HARTMANN PROCEDURE N/A 04/03/2015   Procedure: COLECTOMY WITH COLOSTOMY CREATION/HARTMANN PROCEDURE;  Surgeon: Sherri Rad, MD;  Location: [facility] ORS;  Service: General;  Laterality: N/A;  . COLON SURGERY    . FOOT AMPUTATION     partial  . FOOT AMPUTATION Right   . IVC FILTER INSERTION    . JOINT REPLACEMENT     Left and Right Hip  . PERIPHERAL VASCULAR CATHETERIZATION N/A 04/02/2015   Procedure: Visceral Angiography;  Surgeon: Algernon Huxley, MD;  Location: [facility] LAB;  Service: Cardiovascular;  Laterality: N/A;  . PERIPHERAL VASCULAR CATHETERIZATION N/A 04/02/2015   Procedure: Visceral Artery Intervention;  Surgeon: Algernon Huxley, MD;  Location: [facility] CV LAB;  Service: Cardiovascular;  Laterality: N/A;  . PERIPHERAL VASCULAR CATHETERIZATION N/A 05/25/2015   Procedure: IVC Filter Insertion;  Surgeon: Katha Cabal, MD;  Location: [facility] [facility] LAB;  Service: Cardiovascular;  Laterality: N/A;  . TOE AMPUTATION     right  . TOTAL HIP ARTHROPLASTY      Family History  Problem Relation Age of Onset  . Brain cancer Mother   . Heart attack Father   . COPD Father   . Coronary artery disease Father   . Hypertension Other   . Arthritis-Osteo Other   . Diabetes Brother   . Arthritis Brother   . Osteosarcoma Son   . Bone cancer Son   .  Arthritis Sister   . Arthritis Brother   . Hypertension Brother   . Coronary artery disease Brother   . Malignant hyperthermia Brother     Social History:  reports that she has never smoked. She has never used smokeless tobacco. She reports that she does not drink alcohol or use drugs.  Allergies: No Known Allergies  Medications: I have reviewed the patient's current medications.  Results for orders placed or performed during the hospital encounter of 02/19/2019  (from the past 48 hour(s))  Urinalysis, Routine w reflex microscopic     Status: Abnormal   Collection Time: 02/22/19  3:38 PM  Result Value Ref Range   Color, Urine YELLOW (A) YELLOW   APPearance CLOUDY (A) CLEAR   Specific Gravity, Urine 1.021 1.005 - 1.030   pH 6.0 5.0 - 8.0   Glucose, UA NEGATIVE NEGATIVE mg/dL   Hgb urine dipstick MODERATE (A) NEGATIVE   Bilirubin Urine NEGATIVE NEGATIVE   Ketones, ur 20 (A) NEGATIVE mg/dL   Protein, ur 100 (A) NEGATIVE mg/dL   Nitrite NEGATIVE NEGATIVE   Leukocytes,Ua LARGE (A) NEGATIVE   RBC / HPF 11-20 0 - 5 RBC/hpf   WBC, UA >50 (H) 0 - 5 WBC/hpf   Bacteria, UA MANY (A) NONE SEEN   Squamous Epithelial / LPF 0-5 0 - 5   Mucus PRESENT     Comment: Performed at [facility], [address], [place], [place] [address]  Urine Culture     Status: Abnormal   Collection Time: 02/22/19  3:45 PM  Result Value Ref Range   Specimen Description      URINE, RANDOM Performed at [facility], [address], [place], [place] [address]    Special Requests      Normal Performed at [facility], [address] [place], [place] [address]    Culture MULTIPLE SPECIES PRESENT, SUGGEST RECOLLECTION (A)    Report Status 02/23/2019 FINAL   CULTURE, BLOOD (ROUTINE X 2) w Reflex to ID Panel     Status: None (Preliminary result)   Collection Time: 02/22/19  4:55 PM  Result Value Ref Range   Specimen Description BLOOD BLOOD RIGHT HAND    Special Requests      BOTTLES DRAWN AEROBIC AND ANAEROBIC Blood Culture adequate volume   Culture      NO GROWTH 2 DAYS Performed at [facility], [address] [place], [place] [address]    Report Status PENDING   CULTURE, BLOOD (ROUTINE X 2) w Reflex to ID Panel     Status: None (Preliminary result)   Collection Time: 02/22/19  5:01 PM  Result Value Ref Range   Specimen Description BLOOD BLOOD LEFT HAND    Special Requests      BOTTLES DRAWN AEROBIC AND ANAEROBIC Blood Culture  adequate volume   Culture      NO GROWTH 2 DAYS Performed at [facility], [address], [place], [place] [address]    Report Status PENDING   Glucose, capillary     Status: None   Collection Time: 02/23/19  7:49 AM  Result Value Ref Range   Glucose-Capillary 93 70 - 99 mg/dL  Potassium     Status: Abnormal   Collection Time: 02/23/19  3:54 PM  Result Value Ref Range   Potassium 3.4 (L) 3.5 - 5.1 mmol/L    Comment: Performed at [facility], [address] [place],  [address]  Magnesium     Status: None  Collection Time: 02/23/19  3:54 PM  Result Value Ref Range   Magnesium 1.9 1.7 - 2.4 mg/dL    Comment: Performed at [facility], [address] [place], [place] [address]  Troponin I - ONCE - STAT     Status: Abnormal   Collection Time: 02/23/19  3:54 PM  Result Value Ref Range   Troponin I 0.07 (HH) <0.03 ng/mL    Comment: CRITICAL VALUE NOTED. VALUE IS CONSISTENT WITH PREVIOUSLY REPORTED/CALLED VALUE / [place] Performed at [facility], [address] [place], [place] [address]   Phosphorus     Status: None   Collection Time: 02/23/19  3:54 PM  Result Value Ref Range   Phosphorus 3.0 2.5 - 4.6 mg/dL    Comment: Performed at [facility], [address] [place], [place] [address]  Basic metabolic panel     Status: Abnormal   Collection Time: 02/24/19  3:27 AM  Result Value Ref Range   Sodium 143 135 - 145 mmol/L   Potassium 4.0 3.5 - 5.1 mmol/L   Chloride 103 98 - 111 mmol/L   CO2 29 22 - 32 mmol/L   Glucose, Bld 119 (H) 70 - 99 mg/dL   BUN 14 8 - 23 mg/dL   Creatinine, Ser 0.45 0.44 - 1.00 mg/dL   Calcium 7.9 (L) 8.9 - 10.3 mg/dL   GFR calc non Af Amer >60 >60 mL/min   GFR calc Af Amer >60 >60 mL/min   Anion gap 11 5 - 15    Comment: Performed at [facility], [address] [place], [place] [address]  CBC     Status: Abnormal   Collection Time: 02/24/19  3:27 AM  Result Value Ref Range   WBC  8.9 4.0 - 10.5 K/uL   RBC 3.61 (L) 3.87 - 5.11 MIL/uL   Hemoglobin 8.3 (L) 12.0 - 15.0 g/dL   HCT 30.3 (L) 36.0 - 46.0 %   MCV 83.9 80.0 - 100.0 fL   MCH 23.0 (L) 26.0 - 34.0 pg   MCHC 27.4 (L) 30.0 - 36.0 g/dL   RDW 30.6 (H) 11.5 - 15.5 %   Platelets 177 150 - 400 K/uL   nRBC 1.5 (H) 0.0 - 0.2 %    Comment: Performed at [facility], [address] [place], [place] [address]  Troponin I - ONCE - STAT     Status: Abnormal   Collection Time: 02/24/19  3:27 AM  Result Value Ref Range   Troponin I 0.08 (HH) <0.03 ng/mL    Comment: CRITICAL VALUE NOTED. VALUE IS CONSISTENT WITH PREVIOUSLY REPORTED/CALLED VALUE SMA Performed at [facility], [address] [place], [place] [address]   Magnesium     Status: None   Collection Time: 02/24/19  3:27 AM  Result Value Ref Range   Magnesium 1.8 1.7 - 2.4 mg/dL    Comment: Performed at [facility], [address] [place], [place] [address]    No results found.  Review of Systems  Unable to perform ROS: Mental status change  Neurological: Positive for weakness.   Blood pressure (!) 146/104, pulse 77, temperature (!) 97.5 F (36.4 C), temperature source Axillary, resp. rate (!) 25, height 5\' 2"  (1.575 m), weight 88.1 kg, SpO2 100 %. Physical Exam  Nursing note and vitals reviewed. Constitutional: She appears well-developed and well-nourished.  HENT:  Head: Normocephalic and atraumatic.  Eyes: Pupils are equal, round, and reactive to light. Conjunctivae and EOM are normal.  Neck: Normal range of motion. Neck supple.  Cardiovascular: Normal rate and regular rhythm.  Murmur heard. Respiratory: Effort normal and breath sounds normal.  GI: Bowel sounds are normal.  Musculoskeletal: Normal range of motion.  Neurological: She is disoriented.  Generalized encephalopathy  Skin: Skin is warm, dry and intact.    Assessment/Plan: Paroxysmal SVT Tachycardia Encephalopathy Acute on chronic hypoxic  hypercapnic COPD . Plan Agree with ICU level care Agree with metoprolol IV therapy for tachycardia Will treat conservatively with avoid amiodarone at this point Continue respiratory therapy with inhalers as necessary Probable advanced dementia consider neurology input Encephalopathy possibly with underlying dementia Continue respiratory support Hopefully switched to Cardizem p.o. to help with tachycardic rate and rhythm If unable to be controlled we will then consider IV amiodarone   Rishikesh Khachatryan D Quante Pettry 02/24/2019, 1:01 PM

## 2019-02-24 NOTE — Progress Notes (Signed)
Daily Progress Note   Patient Name: Yolanda Wells       Date: 02/24/2019 DOB: [birth year]  Age: 85 y.o. MRN#: [id number] Attending Physician: Hillary Bow, MD Primary Care Physician: [name], MD Admit Date: 02/16/2019  Reason for Consultation/Follow-up: Establishing goals of care  Subjective: Yolanda Wells is somewhat agitated, confused, and unable to follow commands appropriately. She continues to [employer] words with incomprehensible sounds.   I spoke with her granddaughter, [name] over the phone with other family members in the background including distance family from [place]. We discussed in detail patients current condition and overall status. I also spent a detailed amount of time discussing patient's full code status with consideration and outcomes to her condition.   Family expressed they remain hopeful as patient was not in the state as of last Wednesday and they feel that she is only showing signs of confusion due to being in an unfamiliar location. I attempted to discuss with family despite intermittent agitation and confusion, there are multiple medical conditions that are critical and despite interventions patient continues to show signs of decline and prognosis is poor. I discussed her acute on chronic hypoxic hypercarbic respiratory failure, chronic diastolic CHF, COPD, and also how she has shown signs of declining cognition over time. Family verbalized understanding however they continued to express they are aware of her conditions, however, they are not accepting that patient will not recover and return home to her baseline.   Granddaughter spent time sharing memories and expressing patient was cared for in the home and although she required total assistance, she was able to get out  of bed, stand, turn, and pivot. She also expressed she was giving advice to family members last week and her memory was in intact.   Family verbalized their wishes are for her to continue to receive full aggressive medical interventions and this includes heroic measures in the event necessary (CPR, intubation, defibrillation, ACLS).   Family expressed they would like to have face to face discussion with provider [facility]) and was told they would be allowed to visit given they have drove in from [place]. I explained with strict COVID-19 restrictions and visitation given patient is not EOL and remains a full code and full aggressive measures family would not be allowed to visit. Support was given and I expressed this is  a hard time for families and medical providers and restrictions are to protect our patients and also them as family as well. Family verbalized understanding and appreciation for our safety measures, however continues to request meeting with provider and to be able to see their family member.   I have contacted Dr. Alva Garnet and spoke with him and made bedside RN aware of discussion with family.   Length of Stay: 3  Current Medications: Scheduled Meds:  . chlorhexidine  15 mL Mouth Rinse BID  . Chlorhexidine Gluconate Cloth  6 each Topical Q0600  . enoxaparin (LOVENOX) injection  40 mg Subcutaneous Q24H  . mouth rinse  15 mL Mouth Rinse q12n4p  . mupirocin ointment  1 application Nasal BID  . QUEtiapine  50 mg Oral QHS  . sodium chloride flush  3 mL Intravenous Q12H    Continuous Infusions: . cefTRIAXone (ROCEPHIN)  IV 1 g (02/24/19 0512)  . dextrose 5 % with kcl 50 mL/hr at 02/23/19 1637  . diltiazem (CARDIZEM) infusion      PRN Meds: acetaminophen **OR** acetaminophen, haloperidol lactate, ipratropium-albuterol, lip balm, metoprolol tartrate, [DISCONTINUED] ondansetron **OR** ondansetron (ZOFRAN) IV, polyethylene glycol  Physical Exam Vitals signs and nursing note reviewed.   Constitutional:      General: She is awake.     Comments: Chronically ill   Cardiovascular:     Rate and Rhythm: Tachycardia present. Rhythm irregular.     Pulses: Normal pulses.     Heart sounds: Normal heart sounds.  Pulmonary:     Breath sounds: Decreased breath sounds present.  Neurological:     Mental Status: She is alert. She is confused.     Comments: Agitated   Psychiatric:        Behavior: Behavior is uncooperative.        Cognition and Memory: Cognition is impaired. Memory is impaired.             Vital Signs: BP (!) 118/97 (BP Location: Right Arm)   Pulse 67   Temp 97.8 F (36.6 C) (Axillary)   Resp (!) 30   Ht 5\' 2"  (1.575 m)   Wt 88.1 kg   SpO2 99%   BMI 35.52 kg/m  SpO2: SpO2: 99 % O2 Device: O2 Device: Nasal Cannula O2 Flow Rate: O2 Flow Rate (L/min): 3 L/min  Intake/output summary:   Intake/Output Summary (Last 24 hours) at 02/24/2019 1327 Last data filed at 02/24/2019 0550 Gross per 24 hour  Intake 902 ml  Output 750 ml  Net 152 ml   LBM: Last BM Date: 03/03/2019 Baseline Weight: Weight: 90 kg Most recent weight: Weight: 88.1 kg       Palliative Assessment/Data: PPS 20%    Flowsheet Rows     Most Recent Value  Intake Tab  Referral Department  Hospitalist  Unit at Time of Referral  Med/Surg Unit  Palliative Care Primary Diagnosis  Pulmonary  Date Notified  03/02/2019  Palliative Care Type  Return patient Palliative Care  Reason for referral  Clarify Goals of Care  Date of Admission  03/02/2019  Date first seen by Palliative Care  02/22/19  # of days Palliative referral response time  1 Day(s)  # of days IP prior to Palliative referral  0  Clinical Assessment  Palliative Performance Scale Score  30%  Pain Max last 24 hours  Not able to report  Pain Min Last 24 hours  Not able to report  Dyspnea Max Last 24 Hours  Not able to  report  Dyspnea Min Last 24 hours  Not able to report  Psychosocial & Spiritual Assessment  Palliative Care Outcomes       Patient Active Problem List   Diagnosis Date Noted  . Acute respiratory failure with hypoxia and hypercapnia (HCC)   . Acute on chronic respiratory failure ([place]) 03/01/2019  . Decreased appetite 02/02/2019  . Poor appetite 12/24/2018  . Unsteady 12/24/2018  . Palliative care encounter 12/24/2018  . SOB (shortness of breath) 10/28/2018  . Shortness of breath 09/27/2018  . COPD exacerbation ([place]) 09/22/2018  . Sepsis ([place]) 09/22/2018  . Lower GI bleed 09/09/2018  . AMS (altered mental status) 08/30/2018  . Pressure injury of skin 08/13/2018  . B12 deficiency 08/04/2018  . Anemia 06/07/2018  . Secondary myelofibrosis ([place]) 02/17/2018  . Goals of care, counseling/discussion 12/23/2017  . Palliative care by specialist   . DNR (do not resuscitate) discussion   . Weakness generalized   . Chronic diastolic CHF (congestive heart failure) ([place]) 07/28/2017  . Vascular dementia without behavioral disturbance ([place]) 07/08/2017  . Influenza with respiratory manifestation 12/01/2016  . Respiratory distress 11/23/2016  . Urinary incontinence in female 09/02/2016  . Vision loss of right eye 08/26/2016  . Blindness of right eye 08/26/2016  . Inability to ambulate due to hip 10/12/2015  . Colostomy in place [facility]) 10/04/2015  . Long term current use of anticoagulant therapy 10/03/2015  . Leukocytosis 09/25/2015  . CAP (community acquired pneumonia) 09/24/2015  . COPD (chronic obstructive pulmonary disease) ([place]) 09/24/2015  . UTI (urinary tract infection) 07/16/2015  . Abdominal wall cellulitis 07/15/2015  . DVT (deep venous thrombosis) ([place]) 07/15/2015  . HTN (hypertension) 07/15/2015  . Polycythemia vera ([place]) 07/15/2015  . Arthritis 07/15/2015  . Pulmonary emboli ([place]) 05/25/2015  . Diverticulosis of colon with hemorrhage 04/02/2015  . Diverticulosis of intestine with bleeding 04/02/2015  . Fall 01/10/2015  . Osteoarthritis 01/10/2015  . Hammertoe 09/02/2012  . S/P  transmetatarsal amputation of foot ([place]) 09/02/2012  . Onychomycosis 09/02/2012  . Other specified dermatoses 09/02/2012  . S/P hip replacement 08/23/2012  . Glenohumeral arthritis 06/28/2012    Palliative Care Assessment & Plan   Patient Profile: Per Quinn Axe, NP:  85 y.o. female  with past medical history of  heart failure, long-term anticoagulation use, polycythemia vera, collagen vascular disease cerebellar stroke, history of PE with IVC filter, history of DVT left lower extremity / right lower extremity, bilateral hip replacements, osteoarthritis, diverticulosis with intestinal bleeding, colostomy 2016, hypertension, arthritis, partial right foot amputation admitted on 02/23/2019 with acute on chronic hypoxic/hypercarbic respiratory failure likely secondary to acute on chronic diastolic heart failure.  PMT consulted for goals of care, 8 admissions and 3 ED visits in 6 months  Recommendations/Plan:  FULL CODE  Full aggressive medical interventions per family  Family requesting outpatient palliative at discharge   PMT will continue to support and follow as needed.   Goals of Care and Additional Recommendations:  Limitations on Scope of Treatment: Full Scope Treatment  Code Status:    Code Status Orders  (From admission, onward)         Start     Ordered   03/03/2019 1349  Full code  Continuous     02/11/2019 1349        Code Status History    Date Active Date Inactive Code Status Order ID Comments User Context   02/18/2019 1813 02/20/2019 2309 Full Code [id number]  Otila Back, MD Inpatient   11/08/2018 1532 11/10/2018  2032 DNR [id number]  Dustin Flock, MD Inpatient   10/28/2018 1334 10/31/2018 1621 DNR [id number]  Dustin Flock, MD Inpatient   09/27/2018 1901 09/30/2018 1545 DNR [id number]  Loletha Grayer, MD ED   09/22/2018 0133 09/25/2018 2201 Full Code [id number]  Amelia Jo, MD Inpatient   09/09/2018 1240 09/12/2018 2008 Full Code [id number]  Loletha Grayer, MD ED    08/30/2018 2018 09/01/2018 2004 DNR [id number]  Nicholes Mango, MD ED   08/30/2018 1903 08/30/2018 2018 Full Code [id number]  Nicholes Mango, MD ED   08/13/2018 1038 08/15/2018 1836 Full Code [id number]  Loletha Grayer, MD Inpatient   08/12/2018 2001 08/13/2018 1038 DNR [id number]  Gorden Harms, MD ED   08/12/2018 2001 08/12/2018 2001 Full Code [id number]  Salary, Avel Peace, MD ED   06/15/2018 1635 06/18/2018 2009 Full Code [id number]  [name], MD Inpatient   02/24/2018 0347 02/27/2018 0027 Full Code [id number]  Amelia Jo, MD Inpatient   10/15/2017 1141 10/16/2017 2204 Full Code [id number]  Knox Royalty, NP Inpatient   10/13/2017 2246 10/15/2017 1141 DNR [id number]  Lance Coon, MD ED   10/06/2017 1454 10/09/2017 2058 DNR [id number]  Nicholes Mango, MD Inpatient   10/06/2017 1111 10/06/2017 1453 Full Code [id number]  Nicholes Mango, MD Inpatient   05/27/2017 1954 05/28/2017 2013 Full Code [id number]  Vaughan Basta, MD Inpatient   05/23/2017 2322 05/26/2017 1614 Full Code [id number]  Lance Coon, MD Inpatient   04/10/2017 0325 04/11/2017 1916 Full Code [id number]  Harrie Foreman, MD Inpatient   11/23/2016 0629 12/01/2016 1935 Full Code [id number]  Harrie Foreman, MD Inpatient   08/26/2016 0445 08/26/2016 1923 Full Code [id number]  Saundra Shelling, MD Inpatient   09/22/2015 1755 09/25/2015 1844 Full Code [id number]  Demetrios Loll, MD ED   09/15/2015 0341 09/17/2015 1741 Full Code [id number]  Harrie Foreman, MD Inpatient   07/16/2015 0151 07/20/2015 2334 Full Code [id number]  Lance Coon, MD Inpatient   05/25/2015 0834 05/29/2015 1616 Full Code [id number]  Harrie Foreman, MD Inpatient   04/02/2015 0606 04/08/2015 1651 Full Code [id number]  Harrie Foreman, MD Inpatient       Prognosis:   Guarded to Poor   Discharge Planning:  To Be Determined  Care plan was discussed with patient's family, bedside RN, and Dr. Alva Garnet.   Thank you for allowing the Palliative Medicine Team to assist in the care  of this patient.   Time In: 1120 Time Out: 1230 Total Time 70 min.  Prolonged Time Billed  YES       Greater than 50%  of this time was spent counseling and coordinating care related to the above assessment and plan.  The above conversation was completed via telephone due to the visitor restrictions during the COVID-19 pandemic. Thorough chart review and discussion with necessary members of the care team was completed as part of assessment.   Alda Lea, AGPCNP-BC Palliative Medicine Team  Pager: [phone] Amion: Bjorn Pippin   Please contact Palliative Medicine Team phone at [phone] for questions and concerns.

## 2019-02-24 NOTE — Progress Notes (Signed)
Spoke with patient's granddaughter who was upset regarding her and her uncles not being able to visit their mother. Explained again policy of visitor restrictions in regards to COVID-19.  Granddaughter expressed they were told they could come in to visit and that family took off of work and drove 8 hours to visit and have a bedside discussion and is very upset that is not happening. Again tried to explain current restrictions and that is for the safety of everyone.   Granddaughter expressed she and other family was allowed to visit several days ago and compared that situation to today. I explained they were allowed to visit as patient was being extubated and not expected to do well with possibility of passing away. She is upset and stating, "you all only allow visitors when you want [place] to say it is ok to let them die but not when we want to say ok do this or that! This is unacceptable" She expressed they can not make a decision on DNR versus full code without seeing her and being present. Support given.   I have offered an e-visit and family has agreed. They have requested to proceed with electronic visit, however is still persistent that they are to be allowed to come in and see her.   Family agreed with e-visit.   1445: E-visit via Otelia Sergeant, RN. Family was able to video chat with patient. Patient smiling and able to call most family members by name. Video visit last about 20 min and patient stated to family she loved them and was tired. Family extremely pleased and appreciative of video conference.   Total Time: 35 min  Greater than 50%  of this time was spent counseling and coordinating care related to the above assessment and plan  Alda Lea, AGPCNP-BC Palliative Medicine Team  Pager: [phone] Amion: N. Cousar

## 2019-02-24 NOTE — Progress Notes (Signed)
Assisted tele visit to patient with provider.  Nandi Tonnesen R, RN  

## 2019-02-24 NOTE — Progress Notes (Signed)
Please note, patient was followed by outpatient Palliative at [facility]. She has not been followed since she discharged from there. CMRN Isaias Cowman made aware. Flo Shanks BSN, RN, [facility] [employer] [phone]

## 2019-02-25 LAB — CBC
HCT: 28.7 % — ABNORMAL LOW (ref 36.0–46.0)
Hemoglobin: 7.9 g/dL — ABNORMAL LOW (ref 12.0–15.0)
MCH: 23.3 pg — ABNORMAL LOW (ref 26.0–34.0)
MCHC: 27.5 g/dL — ABNORMAL LOW (ref 30.0–36.0)
MCV: 84.7 fL (ref 80.0–100.0)
Platelets: 173 10*3/uL (ref 150–400)
RBC: 3.39 MIL/uL — ABNORMAL LOW (ref 3.87–5.11)
RDW: 29.6 % — ABNORMAL HIGH (ref 11.5–15.5)
WBC: 7.7 10*3/uL (ref 4.0–10.5)
nRBC: 1.3 % — ABNORMAL HIGH (ref 0.0–0.2)

## 2019-02-25 LAB — BASIC METABOLIC PANEL
Anion gap: 5 (ref 5–15)
BUN: 7 mg/dL — ABNORMAL LOW (ref 8–23)
CO2: 33 mmol/L — ABNORMAL HIGH (ref 22–32)
Calcium: 8.1 mg/dL — ABNORMAL LOW (ref 8.9–10.3)
Chloride: 104 mmol/L (ref 98–111)
Creatinine, Ser: 0.31 mg/dL — ABNORMAL LOW (ref 0.44–1.00)
GFR calc Af Amer: >60 mL/min (ref >60)
GFR calc non Af Amer: >60 mL/min (ref >60)
Glucose, Bld: 124 mg/dL — ABNORMAL HIGH (ref 70–99)
Potassium: 3.6 mmol/L (ref 3.5–5.1)
Sodium: 142 mmol/L (ref 135–145)

## 2019-02-25 MED ORDER — METOPROLOL TARTRATE 5 MG/5ML IV SOLN
2.5000 mg | Freq: Three times a day (TID) | INTRAVENOUS | Status: DC | PRN
  Administered 2019-03-11: 2.5 mg via INTRAVENOUS
  Filled 2019-02-25 (×2): qty 5

## 2019-02-25 MED ORDER — METOPROLOL TARTRATE 25 MG/10 ML ORAL SUSPENSION
12.5000 mg | Freq: Two times a day (BID) | ORAL | Status: DC
  Administered 2019-02-25: 12.5 mg via ORAL
  Filled 2019-02-25 (×2): qty 5

## 2019-02-25 NOTE — Progress Notes (Signed)
Pt transferred from CCU to room 251, VSS, no complaints of pain, NSR on telemetry. I will continue to assess.

## 2019-02-25 NOTE — Progress Notes (Addendum)
Encephalopathy improving. More tractable. Eats and drinks with assistance. Recurrent episodes of PSVT - last yesterday. No recurrence.  Vitals:   02/25/19 0700 02/25/19 0800 02/25/19 0900 02/25/19 0934  BP: (!) 106/49  (!) 81/42 (!) 121/58  Pulse: 73  74 85  Resp: (!) 27  (!) 26 (!) 27  Temp:  97.9 F (36.6 C)    TempSrc:  Oral    SpO2:      Weight:      Height:      2 LPM   Gen: NAD, no respiratory distress HEENT: NCAT, sclerae anicteric, edentulous Neck: No JVD noted Lungs: breath sounds coarse without wheezes Cardiovascular: RRR, no M Abdomen: Soft, nontender, normal BS, ostomy @ place Ext: Warm without clubbing, cyanosis, edema, all toes amputated on R Neuro: no focal deficits,  Skin: Limited exam, no new lesions noted,stage II decub L buttock (noted on admin)   BMP Latest Ref Rng & Units 02/25/2019 02/24/2019 02/23/2019  Glucose 70 - 99 mg/dL 124(H) 119(H) -  BUN 8 - 23 mg/dL 7(L) 14 -  Creatinine 0.44 - 1.00 mg/dL 0.31(L) 0.45 -  Sodium 135 - 145 mmol/L 142 143 -  Potassium 3.5 - 5.1 mmol/L 3.6 4.0 3.4(L)  Chloride 98 - 111 mmol/L 104 103 -  CO2 22 - 32 mmol/L 33(H) 29 -  Calcium 8.9 - 10.3 mg/dL 8.1(L) 7.9(L) -   CBC Latest Ref Rng & Units 02/25/2019 02/24/2019 02/22/2019  WBC 4.0 - 10.5 K/uL 7.7 8.9 8.1  Hemoglobin 12.0 - 15.0 g/dL 7.9(L) 8.3(L) 8.5(L)  Hematocrit 36.0 - 46.0 % 28.7(L) 30.3(L) 31.0(L)  Platelets 150 - 400 K/uL 173 177 193   CXR: No new film  IMPRESSION: Acute on chronic hypoxemic respiratory failure History of severe COPD Pulmonary hypertension, likely group 4 Pyuria, likely UTI Recurrent PSVT, controlled  Requires to be fed  Probable underlying dementia Acute encephalopathy/agitated delirium, improved Chronic atelectasis R lung Anemia of chronic disease with prior Hx. of P Vera Prior DVT, PE  Grade II skin breakdown, L buttock, present PTA  PLAN/REC: Transfer to Telemetry Continue supplemental oxygen she is at baseline: 3 lpm via  [place] Continue bronchodilators as needed Ceftriaxone day 3 of 5 for UTI Metoprolol 12.5 mg BID PO with 2.5 mg IV PRN to maintain HR <115/min Continue Seroquel @hs  Monitor H/H Wound care Continue Lovenox prophylaxis Baseline: wheelchair dep. Dep. of assistance with ADLs   Primary pulmonologist: Dr. Edmon Crape, MD [place] [facility]   02/25/2019 1:10 PM

## 2019-02-25 NOTE — Progress Notes (Signed)
[facility] [place] NAME: Yolanda Wells    MR#:  [id number]  DATE OF BIRTH:  12-Feb-1932  SUBJECTIVE:  CHIEF COMPLAINT:   Chief Complaint  Patient presents with  . Altered Mental Status  . Shortness of Breath   Confused.  Awake and able to speak some words.  REVIEW OF SYSTEMS:    Review of Systems  Unable to perform ROS: Mental status change    DRUG ALLERGIES:  No Known Allergies  VITALS:  Blood pressure (!) 169/84, pulse 85, temperature 98.3 F (36.8 C), temperature source Oral, resp. rate 20, height 5\' 2"  (1.575 m), weight 88.1 kg, SpO2 97 %.  PHYSICAL EXAMINATION:   Physical Exam  GENERAL:  85 y.o.-year-old patient lying in the bed , obese EYES: Pupils equal, round, reactive to light and accommodation. No scleral icterus. Extraocular muscles intact.  HEENT: Head atraumatic, normocephalic. Oropharynx and nasopharynx clear.  NECK:  Supple, no jugular venous distention. No thyroid enlargement, no tenderness.  LUNGS: Normal breath sounds bilaterally, no wheezing, rales, rhonchi. No use of accessory muscles of respiration.  CARDIOVASCULAR: S1, S2 normal. No murmurs, rubs, or gallops.  ABDOMEN: Soft, nontender, nondistended. Bowel sounds present. No organomegaly or mass.  EXTREMITIES: No cyanosis, clubbing or edema b/l.    PSYCHIATRIC: The patient is alert and confused SKIN: No obvious rash, lesion, or ulcer.   LABORATORY PANEL:   CBC Recent Labs  Lab 02/25/19 0321  WBC 7.7  HGB 7.9*  HCT 28.7*  PLT 173   ------------------------------------------------------------------------------------------------------------------ Chemistries  Recent Labs  Lab 03/10/2019 1206  02/24/19 0327 02/25/19 0321  NA 142   < > 143 142  K 3.9   < > 4.0 3.6  CL 105   < > 103 104  CO2 33*   < > 29 33*  GLUCOSE 120*   < > 119* 124*  BUN 17   < > 14 7*  CREATININE 0.48   < > 0.45 0.31*  CALCIUM 8.4*   < > 7.9* 8.1*  MG  --    < > 1.8  --    AST 17  --   --   --   ALT 10  --   --   --   ALKPHOS 66  --   --   --   BILITOT 0.9  --   --   --    < > = values in this interval not displayed.   ------------------------------------------------------------------------------------------------------------------  Cardiac Enzymes Recent Labs  Lab 02/24/19 0327  TROPONINI 0.08*   ------------------------------------------------------------------------------------------------------------------  RADIOLOGY:  No results found.   ASSESSMENT AND PLAN:   * Acute on chronic hypoxic hypercarbic respiratory failure likely secondary to acute on chronic diastolic congestive heart failure and COPD Given IV lasix on admission with no significant UOP Monitor I/Os Patient does have poor respiratory performance at baseline. Patient has not left her house.  Afebrile.  Unlikely to be COVID-19.  She is currently on 2 to 3 L of nasal cannula oxygen and vitals stable so transferred to telemetry floor.   *Acute metabolic encephalopathy secondary to hypercarbia.  Does seem to have baseline mild cognitive impairment. Seems to be worsening over time  * SVT  metoprolol. Seen by cardio- suggested amio drip- IF have SVT again.  *COPD.  No wheezing.  Nebulizers and inhalers ordered.  *UTI On Rocephin, follow cultures.  *DVT prophylaxis Patient is on Eliquis at home with history of recurrent DVTs.    Palliative  care consulted and family meeting setup for today  I discussed with Granddaughter .  All the records are reviewed and case discussed with Care Management/Social Worker Management plans discussed with the patient, family and they are in agreement.  CODE STATUS: FULL CODE  TOTAL TIME TAKING CARE OF THIS PATIENT: 35 minutes.   POSSIBLE D/C IN 1-2 DAYS, DEPENDING ON CLINICAL CONDITION.  Vaughan Basta M.D on 02/25/2019 at 4:56 PM  Between 7am to 6pm - Pager - [phone]  After 6pm go to [email or web address] - password [email or web address]  [facility]  Office  [phone]  CC: Primary care physician; [name], MD  Note: This dictation was prepared with Dragon dictation along with smaller phrase technology. Any transcriptional errors that result from this process are unintentional.

## 2019-02-25 NOTE — Progress Notes (Signed)
This RN and Manufacturing systems engineer attempted IV x1 each. Unsuccessful.

## 2019-02-25 NOTE — Progress Notes (Signed)
Notify Dr. Jannifer Franklin about patient's non-sustain SVT, HR went up to 178 but went to NSR at 77. Patient is asymptomatic, other VSS. RN will continue to monitor.

## 2019-02-26 LAB — GLUCOSE, CAPILLARY: Glucose-Capillary: 115 mg/dL — ABNORMAL HIGH (ref 70–99)

## 2019-02-26 MED ORDER — METOPROLOL TARTRATE 25 MG/10 ML ORAL SUSPENSION
50.0000 mg | Freq: Two times a day (BID) | ORAL | Status: DC
  Administered 2019-02-26 – 2019-02-27 (×3): 50 mg via ORAL
  Filled 2019-02-26 (×6): qty 20

## 2019-02-26 NOTE — Progress Notes (Signed)
[place] at [place] NAME: Yolanda Wells    MR#:  [id number]  DATE OF BIRTH:  Feb 03, 1932  SUBJECTIVE:  CHIEF COMPLAINT:   Chief Complaint  Patient presents with  . Altered Mental Status  . Shortness of Breath   Confused.  Poor oral intake.  On 2 to 3 L oxygen.  REVIEW OF SYSTEMS:    Review of Systems  Unable to perform ROS: Mental status change   DRUG ALLERGIES:  No Known Allergies  VITALS:  Blood pressure (!) 148/70, pulse 88, temperature 99 F (37.2 C), temperature source Oral, resp. rate 19, height 5\' 2"  (1.575 m), weight 88.1 kg, SpO2 100 %.  PHYSICAL EXAMINATION:   Physical Exam  GENERAL:  85 y.o.-year-old patient lying in the bed, obese. EYES: Pupils equal, round, reactive to light and accommodation. No scleral icterus. Extraocular muscles intact.  HEENT: Head atraumatic, normocephalic. Oropharynx and nasopharynx clear.  NECK:  Supple, no jugular venous distention. No thyroid enlargement, no tenderness.  LUNGS: Normal breath sounds bilaterally, no wheezing, rales, rhonchi. No use of accessory muscles of respiration.  CARDIOVASCULAR: S1, S2 normal. No murmurs, rubs, or gallops.  ABDOMEN: Soft, nontender, nondistended. Bowel sounds present. No organomegaly or mass.  EXTREMITIES: No cyanosis, clubbing or edema b/l.    PSYCHIATRIC: The patient is alert and confused. SKIN: No obvious rash, lesion, or ulcer.   LABORATORY PANEL:   CBC Recent Labs  Lab 02/25/19 0321  WBC 7.7  HGB 7.9*  HCT 28.7*  PLT 173   ------------------------------------------------------------------------------------------------------------------ Chemistries  Recent Labs  Lab 02/24/2019 1206  02/24/19 0327 02/25/19 0321  NA 142   < > 143 142  K 3.9   < > 4.0 3.6  CL 105   < > 103 104  CO2 33*   < > 29 33*  GLUCOSE 120*   < > 119* 124*  BUN 17   < > 14 7*  CREATININE 0.48   < > 0.45 0.31*  CALCIUM 8.4*   < > 7.9* 8.1*  MG  --    < > 1.8   --   AST 17  --   --   --   ALT 10  --   --   --   ALKPHOS 66  --   --   --   BILITOT 0.9  --   --   --    < > = values in this interval not displayed.   ------------------------------------------------------------------------------------------------------------------  Cardiac Enzymes Recent Labs  Lab 02/24/19 0327  TROPONINI 0.08*   ------------------------------------------------------------------------------------------------------------------  RADIOLOGY:  No results found.   ASSESSMENT AND PLAN:   * Acute on chronic hypoxic hypercarbic respiratory failure likely secondary to acute on chronic diastolic congestive heart failure and COPD Given IV lasix on admission with no significant UOP Monitor I/Os Patient does have poor respiratory performance at baseline. Patient has not left her house.  Afebrile.  Unlikely to be COVID-19.  She is currently on 2 to 3 L of nasal cannula oxygen   *Acute metabolic encephalopathy secondary to hypercarbia.  Does seem to have baseline mild cognitive impairment. Seems to be worsening over time  * SVT  metoprolol. Seen by cardiology   *COPD.  No wheezing.  Nebulizers and inhalers ordered.  * UTI suspected but UA showed no UTI  *DVT prophylaxis Patient is on Eliquis at home with history of recurrent DVTs.   Palliative care consulted and following  All the records are reviewed and  case discussed with Care Management/Social Worker Management plans discussed with the patient, family and they are in agreement.  CODE STATUS: FULL CODE  TOTAL TIME TAKING CARE OF THIS PATIENT: 35 minutes.   POSSIBLE D/C IN 1-2 DAYS, DEPENDING ON CLINICAL CONDITION.  Neita Carp M.D on 02/26/2019 at 12:58 PM  Between 7am to 6pm - Pager - [phone]  After 6pm go to [email or web address] - password [email or web address] [facility]  Office  [phone]  CC: Primary care physician; [name], MD  Note: This dictation was prepared with  Dragon dictation along with smaller phrase technology. Any transcriptional errors that result from this process are unintentional.

## 2019-02-27 ENCOUNTER — Inpatient Hospital Stay: Payer: Medicare Other

## 2019-02-27 LAB — BLOOD GAS, ARTERIAL
Acid-Base Excess: 7.8 mmol/L — ABNORMAL HIGH (ref 0.0–2.0)
Bicarbonate: 40.1 mmol/L — ABNORMAL HIGH (ref 20.0–28.0)
FIO2: 0.28
O2 Saturation: 99.4 %
Patient temperature: 37
pCO2 arterial: 105 mmHg (ref 32.0–48.0)
pH, Arterial: 7.19 — CL (ref 7.350–7.450)
pO2, Arterial: 185 mmHg — ABNORMAL HIGH (ref 83.0–108.0)

## 2019-02-27 LAB — CULTURE, BLOOD (ROUTINE X 2)
Culture: NO GROWTH
Culture: NO GROWTH
Special Requests: ADEQUATE
Special Requests: ADEQUATE

## 2019-02-27 MED ORDER — FUROSEMIDE 20 MG PO TABS
20.0000 mg | ORAL_TABLET | Freq: Every day | ORAL | Status: DC
  Administered 2019-02-27: 20 mg via ORAL
  Filled 2019-02-27 (×2): qty 1

## 2019-02-27 NOTE — Progress Notes (Signed)
Novel coronavirus swab done using appropriate PPE and sent to lab. Awaiting results.  Per RT, the BiPAP the patient is on has been fitted with an antiviral and antibacterial filter.  Nursing will move the patient when enough appropriate PPE for staff has been brought up to the floor.

## 2019-02-27 NOTE — Progress Notes (Addendum)
Called by nursing that patient has been somnolent most of the day, and is not minimally responsive.  She is here with respiratory failure.  She was hypercarbic several days ago.  Patient is minimally responsive to noxious stimuli tonight on my exam, with clear lung sounds.  Apparently she has a history of sleep apnea as well, per report by nursing.  ABG tonight shows CO2 >100 with pH 7.19.  Will initiate BiPAP.  She also had a temp of 101 earlier, so I will get CBC, BMP, blood cultures, CXR, lactic acid, UA, and will initiate abx based on results.  On further chart review, patient has not yet been tested for novel coronavirus.  Will send this test now, and initiate airborne and contact precautions.  [facility] 02/27/2019, 10:33 PM  Note:  This document was prepared using Dragon voice recognition software and may include unintentional dictation errors.

## 2019-02-27 NOTE — Progress Notes (Signed)
[place] at [place] NAME: Yolanda Wells    MR#:  [id number]  DATE OF BIRTH:  03/04/1932  SUBJECTIVE:  CHIEF COMPLAINT:   Chief Complaint  Patient presents with  . Altered Mental Status  . Shortness of Breath   Confused.  Poor oral intake.  On 2 to 3 L oxygen. No change compared to yesterday.  REVIEW OF SYSTEMS:    Review of Systems  Unable to perform ROS: Mental status change   DRUG ALLERGIES:  No Known Allergies  VITALS:  Blood pressure 135/76, pulse 80, temperature 98.4 F (36.9 C), temperature source Oral, resp. rate 18, height 5\' 2"  (1.575 m), weight 88.1 kg, SpO2 100 %.  PHYSICAL EXAMINATION:   Physical Exam  GENERAL:  85 y.o.-year-old patient lying in the bed, obese. EYES: Pupils equal, round, reactive to light and accommodation. No scleral icterus. Extraocular muscles intact.  HEENT: Head atraumatic, normocephalic. Oropharynx and nasopharynx clear.  NECK:  Supple, no jugular venous distention. No thyroid enlargement, no tenderness.  LUNGS: Normal breath sounds bilaterally, no wheezing, rales, rhonchi. No use of accessory muscles of respiration.  CARDIOVASCULAR: S1, S2 normal. No murmurs, rubs, or gallops.  ABDOMEN: Soft, nontender, nondistended. Bowel sounds present. No organomegaly or mass.  EXTREMITIES: No cyanosis, clubbing or edema b/l.    PSYCHIATRIC: The patient is alert and confused. SKIN: No obvious rash, lesion, or ulcer.   LABORATORY PANEL:   CBC Recent Labs  Lab 02/25/19 0321  WBC 7.7  HGB 7.9*  HCT 28.7*  PLT 173   ------------------------------------------------------------------------------------------------------------------ Chemistries  Recent Labs  Lab 03/05/2019 1206  02/24/19 0327 02/25/19 0321  NA 142   < > 143 142  K 3.9   < > 4.0 3.6  CL 105   < > 103 104  CO2 33*   < > 29 33*  GLUCOSE 120*   < > 119* 124*  BUN 17   < > 14 7*  CREATININE 0.48   < > 0.45 0.31*  CALCIUM 8.4*   < >  7.9* 8.1*  MG  --    < > 1.8  --   AST 17  --   --   --   ALT 10  --   --   --   ALKPHOS 66  --   --   --   BILITOT 0.9  --   --   --    < > = values in this interval not displayed.   ------------------------------------------------------------------------------------------------------------------  Cardiac Enzymes Recent Labs  Lab 02/24/19 0327  TROPONINI 0.08*   ------------------------------------------------------------------------------------------------------------------  RADIOLOGY:  No results found.   ASSESSMENT AND PLAN:   * Acute on chronic hypoxic hypercarbic respiratory failure likely secondary to acute on chronic diastolic congestive heart failure and COPD Given IV lasix on admission with no significant UOP Monitor I/Os Patient does have poor respiratory performance at baseline. Afebrile She is currently on 2 to 3 L of nasal cannula oxygen   *Acute metabolic encephalopathy secondary to hypercarbia.  Does seem to have baseline mild cognitive impairment. Seems to be worsening over time  * SVT  metoprolol. Seen by cardiology   *COPD.  No wheezing.  Nebulizers and inhalers ordered.  * UTI suspected but UA showed no UTI  *DVT prophylaxis Patient is on Eliquis at home with history of recurrent DVTs.   Palliative care consulted and following.  If patient is stable will DC home tomorrow  All the records are reviewed and  case discussed with Care Management/Social Worker Management plans discussed with the patient, family and they are in agreement.  CODE STATUS: FULL CODE  TOTAL TIME TAKING CARE OF THIS PATIENT: 35 minutes.   POSSIBLE D/C IN 1-2 DAYS, DEPENDING ON CLINICAL CONDITION.  Neita Carp M.D on 02/27/2019 at 11:19 AM  Between 7am to 6pm - Pager - [phone]  After 6pm go to [email or web address] - password [email or web address] [facility]  Office  [phone]  CC: Primary care physician; [name], MD  Note: This dictation  was prepared with Dragon dictation along with smaller phrase technology. Any transcriptional errors that result from this process are unintentional.

## 2019-02-27 NOTE — Progress Notes (Signed)
The patient has been sleeping since shift change report was given. Upon trying to give her nighttime medications, she would not respond to verbal or painful stimuli. Notified Dr. Jannifer Franklin of this. ABG ordered and he is coming to assess the patient.

## 2019-02-27 NOTE — Progress Notes (Signed)
RT called to patient bedside for ABG. Rn and MD at bedside. Per MD and ABG results, patient placed on Bipap. Patient respirations approximately 38-40bpm, and low tidal volumes. Per MD, okay to switch patient to AVAPS mode. Patient settings are: 400 Vt, EPAP 8, Rate 8, Pressure minimum 10, Pressure maximum 18, 40% FIO2. Patient tidal volumes have increased to approximately 450, respirations have decreased to approximately 20. Patient tolerating AVAPS mode well. Will continue to monitor. Patient circuit switched to antiviral/antibacterial due to COVID rule out testing being conducted at this time.

## 2019-02-28 LAB — BLOOD GAS, ARTERIAL
Acid-Base Excess: 8.1 mmol/L — ABNORMAL HIGH (ref 0.0–2.0)
Acid-Base Excess: 11.6 mmol/L — ABNORMAL HIGH (ref 0.0–2.0)
Bicarbonate: 38.6 mmol/L — ABNORMAL HIGH (ref 20.0–28.0)
Bicarbonate: 38 mmol/L — ABNORMAL HIGH (ref 20.0–28.0)
Delivery systems: POSITIVE
Delivery systems: POSITIVE
Expiratory PAP: 8
Expiratory PAP: 8
FIO2: 0.4
FIO2: 0.4
Inspiratory PAP: 400
MECHVT: 400 mL
O2 Saturation: 97.4 %
O2 Saturation: 99.3 %
Patient temperature: 37.7
Patient temperature: 37
RATE: 8 resp/min
RATE: 8 resp/min
pCO2 arterial: 86 mmHg (ref 32.0–48.0)
pCO2 arterial: 56 mmHg — ABNORMAL HIGH (ref 32.0–48.0)
pH, Arterial: 7.25 — ABNORMAL LOW (ref 7.350–7.450)
pH, Arterial: 7.44 (ref 7.350–7.450)
pO2, Arterial: 112 mmHg — ABNORMAL HIGH (ref 83.0–108.0)
pO2, Arterial: 144 mmHg — ABNORMAL HIGH (ref 83.0–108.0)

## 2019-02-28 LAB — BASIC METABOLIC PANEL
Anion gap: 9 (ref 5–15)
BUN: 7 mg/dL — ABNORMAL LOW (ref 8–23)
CO2: 34 mmol/L — ABNORMAL HIGH (ref 22–32)
Calcium: 8.7 mg/dL — ABNORMAL LOW (ref 8.9–10.3)
Chloride: 104 mmol/L (ref 98–111)
Creatinine, Ser: 0.65 mg/dL (ref 0.44–1.00)
GFR calc Af Amer: >60 mL/min (ref >60)
GFR calc non Af Amer: >60 mL/min (ref >60)
Glucose, Bld: 111 mg/dL — ABNORMAL HIGH (ref 70–99)
Potassium: 4.4 mmol/L (ref 3.5–5.1)
Sodium: 147 mmol/L — ABNORMAL HIGH (ref 135–145)

## 2019-02-28 LAB — CBC WITH DIFFERENTIAL/PLATELET
Abs Immature Granulocytes: 1.29 10*3/uL — ABNORMAL HIGH (ref 0.00–0.07)
Basophils Absolute: 0.1 10*3/uL (ref 0.0–0.1)
Basophils Relative: 1 %
Eosinophils Absolute: 0.1 10*3/uL (ref 0.0–0.5)
Eosinophils Relative: 0 %
HCT: 38.1 % (ref 36.0–46.0)
Hemoglobin: 9.8 g/dL — ABNORMAL LOW (ref 12.0–15.0)
Immature Granulocytes: 8 %
Lymphocytes Relative: 7 %
Lymphs Abs: 1.1 10*3/uL (ref 0.7–4.0)
MCH: 22.8 pg — ABNORMAL LOW (ref 26.0–34.0)
MCHC: 25.7 g/dL — ABNORMAL LOW (ref 30.0–36.0)
MCV: 88.8 fL (ref 80.0–100.0)
Monocytes Absolute: 0.4 10*3/uL (ref 0.1–1.0)
Monocytes Relative: 2 %
Neutro Abs: 13.3 10*3/uL — ABNORMAL HIGH (ref 1.7–7.7)
Neutrophils Relative %: 82 %
Platelets: 235 10*3/uL (ref 150–400)
RBC: 4.29 MIL/uL (ref 3.87–5.11)
RDW: 31.4 % — ABNORMAL HIGH (ref 11.5–15.5)
Smear Review: NORMAL
WBC: 16.3 10*3/uL — ABNORMAL HIGH (ref 4.0–10.5)
nRBC: 2.8 % — ABNORMAL HIGH (ref 0.0–0.2)

## 2019-02-28 LAB — LACTIC ACID, PLASMA: Lactic Acid, Venous: 1.7 mmol/L (ref 0.5–1.9)

## 2019-02-28 LAB — CREATININE, SERUM
Creatinine, Ser: 0.72 mg/dL (ref 0.44–1.00)
GFR calc Af Amer: >60 mL/min (ref >60)
GFR calc non Af Amer: >60 mL/min (ref >60)

## 2019-02-28 LAB — SARS CORONAVIRUS 2 BY RT PCR (HOSPITAL ORDER, PERFORMED IN ~~LOC~~ HOSPITAL LAB): SARS Coronavirus 2: NEGATIVE

## 2019-02-28 LAB — PATHOLOGIST SMEAR REVIEW

## 2019-02-28 MED ORDER — VANCOMYCIN HCL 1000 MG IV SOLR
1000.0000 mg | INTRAVENOUS | Status: DC
  Filled 2019-02-28 (×2): qty 1000

## 2019-02-28 MED ORDER — METOPROLOL TARTRATE 50 MG PO TABS
50.0000 mg | ORAL_TABLET | Freq: Two times a day (BID) | ORAL | Status: DC
  Filled 2019-02-28 (×2): qty 1

## 2019-02-28 MED ORDER — SODIUM CHLORIDE 0.9 % IV SOLN
2.0000 g | Freq: Two times a day (BID) | INTRAVENOUS | Status: AC
  Administered 2019-02-28 – 2019-03-06 (×14): 2 g via INTRAVENOUS
  Filled 2019-02-28 (×16): qty 2

## 2019-02-28 MED ORDER — SODIUM CHLORIDE 0.9 % IV SOLN
INTRAVENOUS | Status: DC | PRN
  Administered 2019-02-28 – 2019-03-05 (×2): 250 mL via INTRAVENOUS

## 2019-02-28 MED ORDER — VANCOMYCIN HCL 10 G IV SOLR
2000.0000 mg | Freq: Once | INTRAVENOUS | Status: AC
  Administered 2019-02-28: 2000 mg via INTRAVENOUS
  Filled 2019-02-28: qty 2000

## 2019-02-28 MED ORDER — DEXTROSE 5 % IV SOLN
INTRAVENOUS | Status: DC
  Administered 2019-02-28 (×2): via INTRAVENOUS

## 2019-02-28 MED ORDER — SODIUM CHLORIDE 0.9 % IV BOLUS
500.0000 mL | Freq: Once | INTRAVENOUS | Status: AC
  Administered 2019-02-28: 500 mL via INTRAVENOUS

## 2019-02-28 NOTE — Progress Notes (Signed)
Patient transported on bipap to room 228 pending r/o results. abg obtained. Patient tolerated interventions well.

## 2019-02-28 NOTE — Progress Notes (Signed)
BiPAP put on pt but she is talking, yelling and unhappy about wearing it. MD made aware. I will continue to monitor, pt has not attempted to take it off at this time.

## 2019-02-28 NOTE — Progress Notes (Signed)
[place] at [place] NAME: Kehinde Totzke    MR#:  [id number]  DATE OF BIRTH:  30-Aug-1932  SUBJECTIVE:  CHIEF COMPLAINT:   Chief Complaint  Patient presents with  . Altered Mental Status  . Shortness of Breath   Confused and drowzy.  Poor oral intake.   Placed on bipap due to hypercarbic resp failure  REVIEW OF SYSTEMS:    Review of Systems  Unable to perform ROS: Mental status change   DRUG ALLERGIES:  No Known Allergies  VITALS:  Blood pressure 114/77, pulse 86, temperature 99 F (37.2 C), temperature source Oral, resp. rate (!) 38, height 5\' 2"  (1.575 m), weight 88.1 kg, SpO2 100 %.  PHYSICAL EXAMINATION:   Physical Exam  GENERAL:  85 y.o.-year-old patient lying in the bed, obese. EYES: Pupils equal, round, reactive to light and accommodation. No scleral icterus. Extraocular muscles intact.  HEENT: Head atraumatic, normocephalic. Oropharynx and nasopharynx clear.  NECK:  Supple, no jugular venous distention. No thyroid enlargement, no tenderness.  LUNGS: Normal breath sounds bilaterally, no wheezing, rales, rhonchi. No use of accessory muscles of respiration.  CARDIOVASCULAR: S1, S2 normal. No murmurs, rubs, or gallops.  ABDOMEN: Soft, nontender, nondistended. Bowel sounds present. No organomegaly or mass.  EXTREMITIES: No cyanosis, clubbing. LE edema PSYCHIATRIC: The patient is drowzy SKIN: No obvious rash, lesion, or ulcer.   LABORATORY PANEL:   CBC Recent Labs  Lab 02/28/19 0205  WBC 16.3*  HGB 9.8*  HCT 38.1  PLT 235   ------------------------------------------------------------------------------------------------------------------ Chemistries  Recent Labs  Lab 02/18/2019 1206  02/24/19 0327  02/28/19 0205 02/28/19 0504  NA 142   < > 143   < > 147*  --   K 3.9   < > 4.0   < > 4.4  --   CL 105   < > 103   < > 104  --   CO2 33*   < > 29   < > 34*  --   GLUCOSE 120*   < > 119*   < > 111*  --   BUN 17   < >  14   < > 7*  --   CREATININE 0.48   < > 0.45   < > 0.65 0.72  CALCIUM 8.4*   < > 7.9*   < > 8.7*  --   MG  --    < > 1.8  --   --   --   AST 17  --   --   --   --   --   ALT 10  --   --   --   --   --   ALKPHOS 66  --   --   --   --   --   BILITOT 0.9  --   --   --   --   --    < > = values in this interval not displayed.   ------------------------------------------------------------------------------------------------------------------  Cardiac Enzymes Recent Labs  Lab 02/24/19 0327  TROPONINI 0.08*   ------------------------------------------------------------------------------------------------------------------  RADIOLOGY:  Dg Chest Port 1 View  Result Date: 02/27/2019 CLINICAL DATA:  Respiratory failure, fever EXAM: PORTABLE CHEST 1 VIEW COMPARISON:  02/16/2019 FINDINGS: Cardiomegaly. Right basilar atelectasis or infiltrate again noted, slightly worsened since prior study. No confluent opacity on the left. Possible small right effusion. No acute bony abnormality. Advanced degenerative changes in the shoulders. IMPRESSION: Cardiomegaly. Right lower lobe atelectasis or pneumonia with small right  effusion. Electronically Signed   By: Rolm Baptise M.D.   On: 02/27/2019 23:12     ASSESSMENT AND PLAN:   * RLL HCAP Will start IV vancomycin and cefepime Cx sent and pending Afebrile today  * Acute on chronic hypoxic hypercarbic respiratory failure - worsening Pulm HTN, COPD Will need Bipap QHS. NIV to be setup at home Patient does have poor respiratory performance at baseline. Afebrile Will trial off Bipap at this time   * Acute metabolic encephalopathy secondary to hypercarbia.  Has baseline mild cognitive impairment. Seems to be worsening over time  * SVT  On metoprolol. Seen by cardiology   *COPD.  No wheezing.  Nebulizers and inhalers ordered.  * UTI suspected but UA showed no UTI  *DVT prophylaxis Patient is on Eliquis at home with history of recurrent DVTs.    Palliative care consulted and following.  If patient is stable will DC home tomorrow  All the records are reviewed and case discussed with Care Management/Social Worker Management plans discussed with the patient, family and they are in agreement.  CODE STATUS: FULL CODE  TOTAL CRITICAL CARE TIME TAKING CARE OF THIS PATIENT: 35 minutes.   POSSIBLE D/C IN 1-2 DAYS, DEPENDING ON CLINICAL CONDITION.  [name] Mandi Mattioli M.D on 02/28/2019 at 11:28 AM  Between 7am to 6pm - Pager - [phone]  After 6pm go to [email or web address] - password [email or web address] [facility]  Office  [phone]  CC: Primary care physician; [name], MD  Note: This dictation was prepared with Dragon dictation along with smaller phrase technology. Any transcriptional errors that result from this process are unintentional.

## 2019-02-28 NOTE — Progress Notes (Signed)
Dr.Sudini notified of patient having a 3 sec episode of SVT. No new orders, will continue to monitor patient. Patient asymptomatic.

## 2019-02-28 NOTE — Progress Notes (Signed)
* [facility]     Assessment and Plan:  IMPRESSION: Acute on chronic hypoxemic and hypercapnic respiratory failure with chronically elevated right diaphragm.  History of severe COPD Pulmonary hypertension, likely group 4 Acute encephalopathy/agitated delirium with dementia. Long term prognosis appears poor.   PLAN/REC: Continue supplemental oxygen as needed Continue bronchodilators as needed Continue antibiotics.  Continue palliative discussions.  Continue Bipap at night.    Date: 02/28/2019  MRN# [id number] Yolanda Wells 05-03-1932   Yolanda Wells is a 85 y.o. old female seen in follow up for chief complaint of  Chief Complaint  Patient presents with  . Altered Mental Status  . Shortness of Breath     HPI:   The patient is a 85 year old female with history of polycythemia, COPD pulmonary embolism, DVT on chronic anticoagulation with chronic likely pulmonary hypertension, congestive heart failure.  He was brought to the ER encephalopathic and tachypneic.  She was maintained on home hospice for benefits but not necessarily for end-of-life care.  Patient remains full code at this time. She was given IV Lasix for acute on chronic diastolic congestive heart failure. Imaging personally viewed, chest x-ray 02/27/2019, there is a chronically elevated right diaphragm along with the right lower lobe atelectasis possible infiltrate.  Review of lab testing, include ABG shows 7.4 4/56/144/38, consistent with chronic hypercapnic respiratory failure.  She has been placed intermittently on BiPAP at night.  Patient is currently on cefepime, Lovenox, duo nebs, vancomycin.  Medication:    Current Facility-Administered Medications:  .  0.9 %  sodium chloride infusion, , Intravenous, PRN, Hillary Bow, MD, Last Rate: 10 mL/hr at 02/28/19 1221, 250 mL at 02/28/19 1221 .  acetaminophen (TYLENOL) tablet 650 mg, 650 mg, Oral, Q6H PRN **OR** acetaminophen (TYLENOL)  suppository 650 mg, 650 mg, Rectal, Q6H PRN, Sudini, Srikar, MD .  ceFEPIme (MAXIPIME) 2 g in sodium chloride 0.9 % 100 mL IVPB, 2 g, Intravenous, Q12H, Oswald Hillock, RPH, Last Rate: 200 mL/hr at 02/28/19 1223, 2 g at 02/28/19 1223 .  chlorhexidine (PERIDEX) 0.12 % solution 15 mL, 15 mL, Mouth Rinse, BID, Tyler Pita, MD, 15 mL at 02/27/19 1017 .  dextrose 5 % solution, , Intravenous, Continuous, Harrie Foreman, MD, Last Rate: 75 mL/hr at 02/28/19 0852 .  enoxaparin (LOVENOX) injection 40 mg, 40 mg, Subcutaneous, Q24H, Sudini, Srikar, MD, 40 mg at 02/27/19 2104 .  furosemide (LASIX) tablet 20 mg, 20 mg, Oral, Daily, Sudini, Srikar, MD, 20 mg at 02/27/19 1216 .  haloperidol lactate (HALDOL) injection 1 mg, 1 mg, Intravenous, Q6H PRN, Wilhelmina Mcardle, MD, 1 mg at 02/23/19 1448 .  ipratropium-albuterol (DUONEB) 0.5-2.5 (3) MG/3ML nebulizer solution 3 mL, 3 mL, Nebulization, Q4H PRN, Wilhelmina Mcardle, MD .  lip balm (BLISTEX) ointment, , Topical, PRN, Darel Hong D, NP .  MEDLINE mouth rinse, 15 mL, Mouth Rinse, q12n4p, Tyler Pita, MD, 15 mL at 02/27/19 1716 .  metoprolol tartrate (LOPRESSOR) injection 2.5 mg, 2.5 mg, Intravenous, Q8H PRN, Tyler Pita, MD .  metoprolol tartrate (LOPRESSOR) tablet 50 mg, 50 mg, Oral, BID, Charlett Nose, RPH .  [DISCONTINUED] ondansetron (ZOFRAN) tablet 4 mg, 4 mg, Oral, Q6H PRN **OR** ondansetron (ZOFRAN) injection 4 mg, 4 mg, Intravenous, Q6H PRN, Sudini, Srikar, MD .  polyethylene glycol (MIRALAX / GLYCOLAX) packet 17 g, 17 g, Oral, Daily PRN, Sudini, Srikar, MD .  QUEtiapine (SEROQUEL) tablet 50 mg, 50 mg, Oral, QHS, Wilhelmina Mcardle, MD, 50 mg at 02/26/19  2130 .  sodium chloride flush (NS) 0.9 % injection 3 mL, 3 mL, Intravenous, Q12H, Sudini, Srikar, MD, 3 mL at 02/28/19 1219 .  [START ON 03/01/2019] vancomycin (VANCOCIN) 1,000 mg in sodium chloride 0.9 % 250 mL IVPB, 1,000 mg, Intravenous, Q24H, Oswald Hillock, RPH .   vancomycin (VANCOCIN) 2,000 mg in sodium chloride 0.9 % 500 mL IVPB, 2,000 mg, Intravenous, Once, Oswald Hillock, RPH, Last Rate: 250 mL/hr at 02/28/19 1302, 2,000 mg at 02/28/19 1302   Allergies:  Patient has no known allergies.   Review of Systems:  Could not provide ros due to altered mental status.   Physical Examination:   VS: BP 114/77 (BP Location: Left Arm)   Pulse 86   Temp 99 F (37.2 C) (Oral)   Resp (!) 38   Ht 5\' 2"  (1.575 m)   Wt 88.1 kg   SpO2 100%   BMI 35.52 kg/m   General Appearance: No distress  Neuro:awake but confused.  HEENT: PERRLA, EOM intact Pulmonary: No wheezing, No rales  CardiovascularNormal S1,S2.  No m/r/g.  Abdomen: Benign, Soft, non-tender, No masses Renal:  No costovertebral tenderness  GU:  No performed at this time. Endoc: No evident thyromegaly, no signs of acromegaly or Cushing features Skin:   warm, no rashes, no ecchymosis  Extremities: normal, no cyanosis, clubbing.        LABORATORY PANEL:   CBC Recent Labs  Lab 02/28/19 0205  WBC 16.3*  HGB 9.8*  HCT 38.1  PLT 235   ------------------------------------------------------------------------------------------------------------------  Chemistries  Recent Labs  Lab 02/24/19 0327  02/28/19 0205 02/28/19 0504  NA 143   < > 147*  --   K 4.0   < > 4.4  --   CL 103   < > 104  --   CO2 29   < > 34*  --   GLUCOSE 119*   < > 111*  --   BUN 14   < > 7*  --   CREATININE 0.45   < > 0.65 0.72  CALCIUM 7.9*   < > 8.7*  --   MG 1.8  --   --   --    < > = values in this interval not displayed.   ------------------------------------------------------------------------------------------------------------------  Cardiac Enzymes Recent Labs  Lab 02/24/19 0327  TROPONINI 0.08*   ------------------------------------------------------------  RADIOLOGY:   No results found for this or any previous visit. Results for orders placed during the hospital encounter of 09/21/18   DG Chest 2 View   Narrative CLINICAL DATA:  Pain and dyspnea with cough.  EXAM: CHEST - 2 VIEW  COMPARISON:  08/30/2018  FINDINGS: Stable cardiomegaly with tortuous atherosclerotic aorta. Mitral annular calcifications are redemonstrated. Lungs are clear without acute pulmonary consolidation, effusion or pneumothorax. Chronic elevation the right hemidiaphragm. Mild dextroconvex curvature of the midthoracic spine. 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  IMPRESSION: Stable cardiomegaly with tortuous atherosclerotic aorta. No acute pulmonary abnormality.   Electronically Signed   By: Ashley Royalty M.D.   On: 09/21/2018 18:23    ------------------------------------------------------------------------------------------------------------------  Thank  you for allowing [facility] to assist in the care of your patient. Our recommendations are noted above.  Please contact us if we can be of further service.   Marda Stalker, M.D., F.C.C.P.  Board Certified in Internal Medicine, Pulmonary Medicine, [place], and Sleep Medicine.  [facility] Office Number: [phone]  02/28/2019

## 2019-02-28 NOTE — Progress Notes (Signed)
In going over patient's labs and getting a report from the NT that the patient has had very little output overnight, this RN contacted Dr. Marcille Blanco.  Dr. Marcille Blanco came to the floor and we discussed this.  He ordered a small bolus to hydrate her and some D5 for her sodium level.

## 2019-02-28 NOTE — Progress Notes (Signed)
Pastoral Care Visit   02/28/19 1201  Clinical Encounter Type  Visited With Patient;Patient and family together;Other (Comment) Reubin Milan)  Visit Type Psychological support;Follow-up;Social support  Referral From Chaplain  Consult/Referral To Chaplain  Recommendations continued f/u w/ family  Spiritual Encounters  Spiritual Needs Emotional;Other (Comment) (social interaction w/ family)  Stress Factors  Patient Stress Factors Health changes  Family Stress Factors Family relationships;Major life changes;Health changes   This chaplain and Reubin Milan visited pt due to concerns regarding pt emotional deterioration.  This chap visited w/ pt several days ago in ICU and interacted w/ pt's granddaughter (HCPOA). Several days ago pt was verbal and physically moving around (albeit demented).  Now pt seems lethargic, has stopped eating, and barely opening eyes. Pt attempted to communicated to chap and it was determined that pt was thirsty.  Pt refused milk, coffee, and accepted the orange juice on her tray.  Pt wanted more.  This chap asked RN for addtl juice and pt drank rapidly a second cup of juice.  Chap contacted via Duo app, the pt's granddaughter to interact w/ pt via video chat.  Pt perked up and interacted w/ granddaughter. As time went on pt was wide eyed, expressing her wants, saying "I love you" to granddaughter, and saying that she wanted to be moved up in the bed.  Extra patience is needed with this pt as she is slow to warm up and talk/express wants.  Pt still refused pills with RN and was mildly combative but at least she was alert and responsive.  Pt's son also called chap and did video chat w/ Duo App.  He was able to witness RNs caring for his mother and chaps present and helping.  Pt son and granddaughter expressed gratitude for the video call.  This chap strongly urges staff to interact with pt along with family members as she is [facility] MORE responsive with familial interaction and  support.  Darcey Nora, Chaplain

## 2019-02-28 NOTE — Progress Notes (Signed)
Ch visited pt to see how they were progressing. Pt became more alert once she was able to see her granddaughter and son via facetime. Pt was able to communicate a lot better as well. Pt was able to drink 2 containers of OJ with assistance but is refusing food and pills at this time. Pt benefited from social interaction from staff and was rotated while ch was presented.    02/28/19 1200  Clinical Encounter Type  Visited With Patient;Health care provider  Visit Type Psychological support;Spiritual support;Social support  Referral From Nurse  Consult/Referral To Chaplain  Recommendations f/u w/ family   Spiritual Encounters  Spiritual Needs Grief support;Emotional  Stress Factors  Patient Stress Factors Health changes;Loss of control;Major life changes  Family Stress Factors Family relationships;Major life changes

## 2019-02-28 NOTE — Care Management Important Message (Signed)
Important Message  Patient Details  Name: Yolanda Wells MRN: [id number] Date of Birth: Jan 02, 1932   Medicare Important Message Given:  Yes    Dannette Barbara 02/28/2019, 11:15 AM

## 2019-02-28 NOTE — Progress Notes (Signed)
Respiratory therapist and ICU nurse at bedside. Bipap taken off by respiratory therapist, patient on 2L of oxygen with 100% oxygen saturation. Patient responds to voice but still lethargic. Other VSS. Will continue to monitor patient.

## 2019-02-28 NOTE — Progress Notes (Signed)
NIV  Diagnosis -severe  COPD and chronic respiratory failure  Patient presently inpatient and continues to have hypercapnia associated with chronic respiratory failure secondary to severe COPD.  Patient requires use of NIV nightly and daytime to help with exacerbations. NIV will treat patient's high PCO2 levels and can reduce risk of exacerbations and future hospitalizations when used at night and during the daytime.  She will need these advanced settings in conjunction with her current medications.  BiPAP is not an option due to its functional limitations and severity of the patient's condition.  Failure to have NIV available for use over 24-hour.  Could lead to death.

## 2019-02-28 NOTE — Consult Note (Signed)
Pharmacy Antibiotic Note  Yolanda Wells is a 85 y.o. female admitted on 03/08/2019 with pneumonia.  Pharmacy has been consulted for cefepime and vancomycin dosing.  Plan: Will start cefepime 2 g q12H   Will give vancomycin 2000 mg x1 loading dosing followed by a maintenance dose of 1000 mg q24H for a predicted AUC of 533. Goal AUC is 400-550. Scr used 0.72 for calculations. Plan to order levels in 4-5 days. Ordered MRSA PCR. Recommend discontinuing vancomycin if MRSA PCR is negative.    Height: 5\' 2"  (157.5 cm) Weight: (wasnt able to get weight) IBW/kg (Calculated) : 50.1  Temp (24hrs), Avg:99.3 F (37.4 C), Min:98.3 F (36.8 C), Max:101 F (38.3 C)  Recent Labs  Lab 02/28/2019 1206 02/22/19 0425 02/24/19 0327 02/25/19 0321 02/28/19 0205 02/28/19 0504  WBC 16.2* 8.1 8.9 7.7 16.3*  --   CREATININE 0.48 0.54 0.45 0.31* 0.65 0.72  LATICACIDVEN  --   --   --   --  1.7  --     Estimated Creatinine Clearance: 54 mL/min (by C-G formula based on SCr of 0.72 mg/dL).    No Known Allergies  Antimicrobials this admission: 4/15 ceftriaxone >> 4/18 4/20 cefepime >>  4/20 vancomycin >>   Dose adjustments this admission: None  Microbiology results: 4/20 BCx: pending 4/20 MRSA PCR: ordered    Thank you for allowing pharmacy to be a part of this patient's care.  Oswald Hillock, PharmD, BCPS  Clinical Pharmacist  02/28/2019 8:39 AM

## 2019-03-01 ENCOUNTER — Inpatient Hospital Stay: Payer: Medicare Other

## 2019-03-01 LAB — BLOOD GAS, ARTERIAL
Acid-Base Excess: 9 mmol/L — ABNORMAL HIGH (ref 0.0–2.0)
Acid-Base Excess: 11.8 mmol/L — ABNORMAL HIGH (ref 0.0–2.0)
Bicarbonate: 38.3 mmol/L — ABNORMAL HIGH (ref 20.0–28.0)
Bicarbonate: 37.2 mmol/L — ABNORMAL HIGH (ref 20.0–28.0)
Delivery systems: POSITIVE
Expiratory PAP: 8
FIO2: 36
FIO2: 0.4
MECHVT: 400 mL
MECHVT: 450 mL
O2 Saturation: 94.4 %
O2 Saturation: 99.3 %
Patient temperature: 37
Patient temperature: 37
RATE: 16 resp/min
pCO2 arterial: 76 mmHg (ref 32.0–48.0)
pCO2 arterial: 50 mmHg — ABNORMAL HIGH (ref 32.0–48.0)
pH, Arterial: 7.31 — ABNORMAL LOW (ref 7.350–7.450)
pH, Arterial: 7.48 — ABNORMAL HIGH (ref 7.350–7.450)
pO2, Arterial: 79 mmHg — ABNORMAL LOW (ref 83.0–108.0)
pO2, Arterial: 143 mmHg — ABNORMAL HIGH (ref 83.0–108.0)

## 2019-03-01 LAB — GLUCOSE, CAPILLARY
Glucose-Capillary: 106 mg/dL — ABNORMAL HIGH (ref 70–99)
Glucose-Capillary: 164 mg/dL — ABNORMAL HIGH (ref 70–99)
Glucose-Capillary: 137 mg/dL — ABNORMAL HIGH (ref 70–99)

## 2019-03-01 LAB — HEPARIN LEVEL (UNFRACTIONATED): Heparin Unfractionated: 0.34 IU/mL (ref 0.30–0.70)

## 2019-03-01 LAB — POTASSIUM: Potassium: 3.7 mmol/L (ref 3.5–5.1)

## 2019-03-01 LAB — CBC
HCT: 30.1 % — ABNORMAL LOW (ref 36.0–46.0)
Hemoglobin: 8.3 g/dL — ABNORMAL LOW (ref 12.0–15.0)
MCH: 23.6 pg — ABNORMAL LOW (ref 26.0–34.0)
MCHC: 27.6 g/dL — ABNORMAL LOW (ref 30.0–36.0)
MCV: 85.8 fL (ref 80.0–100.0)
Platelets: 174 10*3/uL (ref 150–400)
RBC: 3.51 MIL/uL — ABNORMAL LOW (ref 3.87–5.11)
RDW: 29.7 % — ABNORMAL HIGH (ref 11.5–15.5)
WBC: 9.8 10*3/uL (ref 4.0–10.5)
nRBC: 4 % — ABNORMAL HIGH (ref 0.0–0.2)

## 2019-03-01 LAB — BASIC METABOLIC PANEL
Anion gap: 10 (ref 5–15)
BUN: 9 mg/dL (ref 8–23)
CO2: 30 mmol/L (ref 22–32)
Calcium: 8.2 mg/dL — ABNORMAL LOW (ref 8.9–10.3)
Chloride: 102 mmol/L (ref 98–111)
Creatinine, Ser: 0.5 mg/dL (ref 0.44–1.00)
GFR calc Af Amer: >60 mL/min (ref >60)
GFR calc non Af Amer: >60 mL/min (ref >60)
Glucose, Bld: 139 mg/dL — ABNORMAL HIGH (ref 70–99)
Potassium: 4.2 mmol/L (ref 3.5–5.1)
Sodium: 142 mmol/L (ref 135–145)

## 2019-03-01 LAB — PROCALCITONIN: Procalcitonin: 0.16 ng/mL

## 2019-03-01 LAB — MAGNESIUM: Magnesium: 1.6 mg/dL — ABNORMAL LOW (ref 1.7–2.4)

## 2019-03-01 LAB — PHOSPHORUS: Phosphorus: 2.1 mg/dL — ABNORMAL LOW (ref 2.5–4.6)

## 2019-03-01 MED ORDER — CHLORHEXIDINE GLUCONATE 0.12% ORAL RINSE (MEDLINE KIT)
15.0000 mL | Freq: Two times a day (BID) | OROMUCOSAL | Status: DC
  Administered 2019-03-01 – 2019-03-11 (×21): 15 mL via OROMUCOSAL

## 2019-03-01 MED ORDER — DEXMEDETOMIDINE HCL IN NACL 200 MCG/50ML IV SOLN
0.4000 ug/kg/h | INTRAVENOUS | Status: DC
  Administered 2019-03-01: 0.8 ug/kg/h via INTRAVENOUS
  Administered 2019-03-01 – 2019-03-02 (×6): 1 ug/kg/h via INTRAVENOUS
  Administered 2019-03-03 (×5): 1.2 ug/kg/h via INTRAVENOUS
  Administered 2019-03-04 (×3): 0.4 ug/kg/h via INTRAVENOUS
  Filled 2019-03-01 (×2): qty 50

## 2019-03-01 MED ORDER — IPRATROPIUM-ALBUTEROL 0.5-2.5 (3) MG/3ML IN SOLN
3.0000 mL | Freq: Four times a day (QID) | RESPIRATORY_TRACT | Status: DC
  Administered 2019-03-01 – 2019-03-13 (×49): 3 mL via RESPIRATORY_TRACT
  Filled 2019-03-01 (×50): qty 3

## 2019-03-01 MED ORDER — ASPIRIN 81 MG PO CHEW
81.0000 mg | CHEWABLE_TABLET | Freq: Every day | ORAL | Status: DC
  Administered 2019-03-01 – 2019-03-11 (×11): 81 mg
  Filled 2019-03-01 (×11): qty 1

## 2019-03-01 MED ORDER — BUDESONIDE 0.5 MG/2ML IN SUSP
0.5000 mg | Freq: Two times a day (BID) | RESPIRATORY_TRACT | Status: DC
  Administered 2019-03-01 – 2019-03-08 (×14): 0.5 mg via RESPIRATORY_TRACT
  Filled 2019-03-01 (×14): qty 2

## 2019-03-01 MED ORDER — FENTANYL CITRATE (PF) 100 MCG/2ML IJ SOLN
25.0000 ug | INTRAMUSCULAR | Status: DC | PRN
  Administered 2019-03-02 – 2019-03-10 (×18): 25 ug via INTRAVENOUS
  Filled 2019-03-01 (×17): qty 2

## 2019-03-01 MED ORDER — VANCOMYCIN HCL IN DEXTROSE 1-5 GM/200ML-% IV SOLN
1000.0000 mg | INTRAVENOUS | Status: AC
  Administered 2019-03-01 – 2019-03-06 (×6): 1000 mg via INTRAVENOUS
  Filled 2019-03-01 (×7): qty 200

## 2019-03-01 MED ORDER — HEPARIN (PORCINE) 25000 UT/250ML-% IV SOLN
1400.0000 [IU]/h | INTRAVENOUS | Status: DC
  Administered 2019-03-01 – 2019-03-02 (×2): 1150 [IU]/h via INTRAVENOUS
  Administered 2019-03-03 – 2019-03-05 (×3): 1300 [IU]/h via INTRAVENOUS
  Administered 2019-03-06 – 2019-03-09 (×5): 1400 [IU]/h via INTRAVENOUS
  Filled 2019-03-01 (×11): qty 250

## 2019-03-01 MED ORDER — ADULT MULTIVITAMIN LIQUID CH
15.0000 mL | Freq: Every day | ORAL | Status: DC
  Administered 2019-03-01 – 2019-03-11 (×11): 15 mL
  Filled 2019-03-01 (×11): qty 15

## 2019-03-01 MED ORDER — VANCOMYCIN HCL IN DEXTROSE 1-5 GM/200ML-% IV SOLN
1000.0000 mg | INTRAVENOUS | Status: DC
  Filled 2019-03-01: qty 200

## 2019-03-01 MED ORDER — SENNOSIDES-DOCUSATE SODIUM 8.6-50 MG PO TABS
1.0000 | ORAL_TABLET | Freq: Two times a day (BID) | ORAL | Status: DC
  Administered 2019-03-01 – 2019-03-13 (×19): 1 via ORAL
  Filled 2019-03-01 (×19): qty 1

## 2019-03-01 MED ORDER — VITAL HIGH PROTEIN PO LIQD
1000.0000 mL | ORAL | Status: DC
  Administered 2019-03-01 – 2019-03-10 (×10): 1000 mL

## 2019-03-01 MED ORDER — INSULIN ASPART 100 UNIT/ML ~~LOC~~ SOLN
0.0000 [IU] | SUBCUTANEOUS | Status: DC
  Administered 2019-03-01: 2 [IU] via SUBCUTANEOUS
  Administered 2019-03-01: 22:00:00 1 [IU] via SUBCUTANEOUS
  Administered 2019-03-02: 01:00:00 2 [IU] via SUBCUTANEOUS
  Administered 2019-03-02: 1 [IU] via SUBCUTANEOUS
  Administered 2019-03-02 (×2): 2 [IU] via SUBCUTANEOUS
  Administered 2019-03-02: 3 [IU] via SUBCUTANEOUS
  Administered 2019-03-02 – 2019-03-03 (×3): 2 [IU] via SUBCUTANEOUS
  Administered 2019-03-03 (×2): 1 [IU] via SUBCUTANEOUS
  Administered 2019-03-03: 21:00:00 2 [IU] via SUBCUTANEOUS
  Administered 2019-03-03 – 2019-03-04 (×2): 1 [IU] via SUBCUTANEOUS
  Administered 2019-03-04: 2 [IU] via SUBCUTANEOUS
  Administered 2019-03-04: 20:00:00 1 [IU] via SUBCUTANEOUS
  Administered 2019-03-04 (×2): 2 [IU] via SUBCUTANEOUS
  Administered 2019-03-04: 12:00:00 1 [IU] via SUBCUTANEOUS
  Administered 2019-03-05: 2 [IU] via SUBCUTANEOUS
  Administered 2019-03-05: 12:00:00 1 [IU] via SUBCUTANEOUS
  Administered 2019-03-05: 2 [IU] via SUBCUTANEOUS
  Administered 2019-03-05 (×2): 1 [IU] via SUBCUTANEOUS
  Administered 2019-03-05: 23:00:00 3 [IU] via SUBCUTANEOUS
  Administered 2019-03-06 (×3): 2 [IU] via SUBCUTANEOUS
  Administered 2019-03-06: 12:00:00 1 [IU] via SUBCUTANEOUS
  Administered 2019-03-06: 23:00:00 2 [IU] via SUBCUTANEOUS
  Administered 2019-03-06: 03:00:00 3 [IU] via SUBCUTANEOUS
  Administered 2019-03-07 (×5): 2 [IU] via SUBCUTANEOUS
  Administered 2019-03-08 (×3): 1 [IU] via SUBCUTANEOUS
  Administered 2019-03-08 – 2019-03-09 (×5): 2 [IU] via SUBCUTANEOUS
  Administered 2019-03-09: 1 [IU] via SUBCUTANEOUS
  Administered 2019-03-09 – 2019-03-10 (×3): 2 [IU] via SUBCUTANEOUS
  Administered 2019-03-10: 1 [IU] via SUBCUTANEOUS
  Administered 2019-03-10 (×2): 2 [IU] via SUBCUTANEOUS
  Administered 2019-03-10 – 2019-03-11 (×3): 1 [IU] via SUBCUTANEOUS
  Administered 2019-03-11 (×4): 2 [IU] via SUBCUTANEOUS
  Administered 2019-03-12: 1 [IU] via SUBCUTANEOUS
  Administered 2019-03-13: 2 [IU] via SUBCUTANEOUS
  Administered 2019-03-13 – 2019-03-15 (×4): 1 [IU] via SUBCUTANEOUS
  Filled 2019-03-01 (×65): qty 1

## 2019-03-01 MED ORDER — STERILE WATER FOR INJECTION IJ SOLN
INTRAMUSCULAR | Status: AC
  Administered 2019-03-01: 10 mL
  Filled 2019-03-01: qty 10

## 2019-03-01 MED ORDER — SODIUM PHOSPHATES 45 MMOLE/15ML IV SOLN
10.0000 mmol | Freq: Once | INTRAVENOUS | Status: AC
  Administered 2019-03-01: 10 mmol via INTRAVENOUS
  Filled 2019-03-01: qty 3.33

## 2019-03-01 MED ORDER — MIDAZOLAM HCL 2 MG/2ML IJ SOLN
INTRAMUSCULAR | Status: AC
  Administered 2019-03-01: 4 mg
  Filled 2019-03-01: qty 4

## 2019-03-01 MED ORDER — HEPARIN BOLUS VIA INFUSION
2000.0000 [IU] | Freq: Once | INTRAVENOUS | Status: AC
  Administered 2019-03-01: 14:00:00 2000 [IU] via INTRAVENOUS
  Filled 2019-03-01: qty 2000

## 2019-03-01 MED ORDER — PANTOPRAZOLE SODIUM 40 MG PO PACK
40.0000 mg | PACK | Freq: Every day | ORAL | Status: DC
  Administered 2019-03-01 – 2019-03-11 (×11): 40 mg
  Filled 2019-03-01 (×13): qty 20

## 2019-03-01 MED ORDER — MAGNESIUM SULFATE 2 GM/50ML IV SOLN
2.0000 g | Freq: Once | INTRAVENOUS | Status: AC
  Administered 2019-03-01: 2 g via INTRAVENOUS
  Filled 2019-03-01: qty 50

## 2019-03-01 MED ORDER — FENTANYL CITRATE (PF) 100 MCG/2ML IJ SOLN
INTRAMUSCULAR | Status: AC
  Administered 2019-03-01: 11:00:00 100 ug via INTRAVENOUS
  Filled 2019-03-01: qty 4

## 2019-03-01 MED ORDER — MIDAZOLAM HCL 2 MG/2ML IJ SOLN
0.5000 mg | INTRAMUSCULAR | Status: DC | PRN
  Administered 2019-03-01 – 2019-03-02 (×4): 1 mg via INTRAVENOUS
  Filled 2019-03-01 (×5): qty 2

## 2019-03-01 MED ORDER — ORAL CARE MOUTH RINSE
15.0000 mL | OROMUCOSAL | Status: DC
  Administered 2019-03-01 – 2019-03-11 (×95): 15 mL via OROMUCOSAL

## 2019-03-01 MED ORDER — VECURONIUM BROMIDE 10 MG IV SOLR
INTRAVENOUS | Status: AC
  Administered 2019-03-01: 10 mg via INTRAVENOUS
  Filled 2019-03-01: qty 10

## 2019-03-01 MED ORDER — MIDAZOLAM HCL (PF) 2 MG/2ML IJ SOLN
4.0000 mg | Freq: Once | INTRAMUSCULAR | Status: DC
  Filled 2019-03-01: qty 4

## 2019-03-01 MED ORDER — FENTANYL CITRATE (PF) 100 MCG/2ML IJ SOLN
200.0000 ug | Freq: Once | INTRAMUSCULAR | Status: AC
  Administered 2019-03-01: 100 ug via INTRAVENOUS

## 2019-03-01 MED ORDER — VECURONIUM BROMIDE 10 MG IV SOLR
10.0000 mg | Freq: Once | INTRAVENOUS | Status: AC
  Administered 2019-03-01: 10 mg via INTRAVENOUS
  Filled 2019-03-01: qty 10

## 2019-03-01 NOTE — Progress Notes (Signed)
ANTICOAGULATION CONSULT NOTE - Initial Consult  Pharmacy Consult for Heparin Drip Management  Indication: History of DVT/PE; patient on apixaban as an outpatient   No Known Allergies  Patient Measurements: Height: 5\' 2"  (157.5 cm) Weight: 194 lb 4.5 oz (88.1 kg) IBW/kg (Calculated) : 50.1 Heparin Dosing Weight: 71 kg.   Vital Signs: Temp: 101.1 F (38.4 C) (04/21 1100) Temp Source: Axillary (04/21 1100) BP: 122/73 (04/21 1100) Pulse Rate: 86 (04/21 1100)  Labs: Recent Labs    02/28/19 0205 02/28/19 0504 03/01/19 1155  HGB 9.8*  --   --   HCT 38.1  --   --   PLT 235  --   --   CREATININE 0.65 0.72 0.50    Estimated Creatinine Clearance: 54 mL/min (by C-G formula based on SCr of 0.5 mg/dL).   Medical History: Past Medical History:  Diagnosis Date  . Acute deep vein thrombosis (DVT) of distal vein of left lower extremity ([place]) 10/03/2015  . Acute pulmonary embolism ([place]) 10/03/2015  . Anticoagulant long-term use   . Arthritis   . Arthritis   . CHF (congestive heart failure) ([place])   . Chronic anticoagulation 10/03/2015  . Collagen vascular disease ([place])   . Colostomy in place [facility]) 10/04/2015  . Deep venous thrombosis ([facility])    right lower extremity  . Dependent edema   . Diverticulosis of intestine with bleeding 04/02/2015  . Glenohumeral arthritis 06/28/2012  . Hammertoe 09/02/2012  . History of bilateral hip replacements 09/02/2012  . History of hysterectomy   . Hypertension   . Hypertension   . Hypokalemia   . Inability to ambulate due to hip 10/12/2015  . Mandibular dysfunction   . Onychomycosis 09/02/2012  . Osteoarthritis   . Polycythemia vera ([place])   . Presence of IVC filter 05/25/2015  . Stroke ([place]) 03/26/2015   cerebellar  . Urinary incontinence in female 09/02/2016    Medications:  Scheduled:  . aspirin  81 mg Per Tube Daily  . budesonide (PULMICORT) nebulizer solution  0.5 mg Nebulization BID  . chlorhexidine  15 mL Mouth Rinse BID  .  insulin aspart  0-9 Units Subcutaneous Q4H  . ipratropium-albuterol  3 mL Nebulization Q6H  . mouth rinse  15 mL Mouth Rinse q12n4p  . midazolam PF  4 mg Intravenous Once  . multivitamin  15 mL Per Tube Daily  . pantoprazole sodium  40 mg Per Tube Daily  . senna-docusate  1 tablet Oral BID  . sodium chloride flush  3 mL Intravenous Q12H   Infusions:  . sodium chloride Stopped (03/01/19 0949)  . ceFEPime (MAXIPIME) IV 2 g (03/01/19 0949)  . dexmedetomidine (PRECEDEX) IV infusion 1 mcg/kg/hr (03/01/19 1137)  . feeding supplement (VITAL HIGH PROTEIN)    . 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 Patient takes apixban 5mg  BID as an outpatient. Patient admitted on 4/13 unable to tolerate oral medications and was started on enoxaparin 40mg  Q24hr at that time. Patient admitted to ICU on 4/21 with worsening respiratory and subsequently intubated. Order to start heparin infusion given post intubation. Patient last received enoxaparin 40mg  at 2023 on 4/20.   Goal of Therapy:  Heparin level 0.3-0.7 units/ml Monitor platelets by anticoagulation protocol: Yes   Plan:  Will order reduced bolus of 2000 units x 1 and initiate drip at 1150 units/hr. Will obtain heparin level 8 hours after initiation.   Pharmacy  will continue to monitor and adjust per consult.    , L 03/01/2019,12:25 PM

## 2019-03-01 NOTE — Progress Notes (Signed)
[place] [facility] [place] NAME: Yolanda Wells    MR#:  [id number]  DATE OF BIRTH:  April 23, 1932  SUBJECTIVE:  CHIEF COMPLAINT:   Chief Complaint  Patient presents with  . Altered Mental Status  . Shortness of Breath   Unresponsive to sternal rub. Tachypneic. Worse Bipap at night but off it since 3 AM when she pulled it off.  REVIEW OF SYSTEMS:    Review of Systems  Unable to perform ROS: Mental status change   DRUG ALLERGIES:  No Known Allergies  VITALS:  Blood pressure (!) 145/75, pulse 97, temperature 99.9 F (37.7 C), temperature source Oral, resp. rate (!) 30, height 5\' 2"  (1.575 m), weight 88.1 kg, SpO2 100 %.  PHYSICAL EXAMINATION:   Physical Exam  GENERAL:  85 y.o.-year-old patient lying in the bed, obese. Critically ill EYES: Pupils equal, round, reactive to light and accommodation. No scleral icterus. Extraocular muscles intact.  HEENT: Head atraumatic, normocephalic. Oropharynx and nasopharynx clear.  NECK:  Supple, no jugular venous distention. No thyroid enlargement, no tenderness.  LUNGS: Decrease breath sounds bilateral, shallow, rapid CARDIOVASCULAR: S1, S2 normal. No murmurs, rubs, or gallops.  ABDOMEN: Soft, nontender, nondistended. Bowel sounds present. No organomegaly or mass.  EXTREMITIES: No cyanosis, clubbing. LE edema PSYCHIATRIC: The patient is barely moaning on sternal rub  LABORATORY PANEL:   CBC Recent Labs  Lab 02/28/19 0205  WBC 16.3*  HGB 9.8*  HCT 38.1  PLT 235   ------------------------------------------------------------------------------------------------------------------ Chemistries  Recent Labs  Lab 02/24/19 0327  02/28/19 0205 02/28/19 0504  NA 143   < > 147*  --   K 4.0   < > 4.4  --   CL 103   < > 104  --   CO2 29   < > 34*  --   GLUCOSE 119*   < > 111*  --   BUN 14   < > 7*  --   CREATININE 0.45   < > 0.65 0.72  CALCIUM 7.9*   < > 8.7*  --   MG 1.8  --   --   --    < > =  values in this interval not displayed.   ------------------------------------------------------------------------------------------------------------------  Cardiac Enzymes Recent Labs  Lab 02/24/19 0327  TROPONINI 0.08*   ------------------------------------------------------------------------------------------------------------------  RADIOLOGY:  Dg Chest Port 1 View  Result Date: 02/27/2019 CLINICAL DATA:  Respiratory failure, fever EXAM: PORTABLE CHEST 1 VIEW COMPARISON:  03/07/2019 FINDINGS: Cardiomegaly. Right basilar atelectasis or infiltrate again noted, slightly worsened since prior study. No confluent opacity on the left. Possible small right effusion. No acute bony abnormality. Advanced degenerative changes in the shoulders. IMPRESSION: Cardiomegaly. Right lower lobe atelectasis or pneumonia with small right effusion. Electronically Signed   By: Rolm Baptise M.D.   On: 02/27/2019 23:12     ASSESSMENT AND PLAN:   * Acute on chronic hypoxic hypercarbic respiratory failure - worsening Pulm HTN, COPD and now PNA Will need Bipap now. Check ABG. Intubation if no improvement. Discussed with Dr. Mortimer Fries. Patient does have poor respiratory performance at baseline. Afebrile NIV ordered and ready for home at discharge. Discussed with CM  * RLL HCAP On  IV vancomycin and cefepime Cx sent and pending tmax 100.5   * Acute metabolic encephalopathy secondary to hypercarbia.  Has baseline mild cognitive impairment. Seems to be worsening over time  * SVT  On metoprolol. Seen by cardiology   *COPD.  No wheezing.  Nebulizers and inhalers  ordered.  * UTI suspected but UA showed no UTI  *DVT prophylaxis Patient is on Eliquis at home with history of recurrent DVTs.   Palliative care consulted and following.  If patient is stable will DC home tomorrow  All the records are reviewed and case discussed with Care Management/Social Worker Management plans discussed with the  patient, family and they are in agreement.  CODE STATUS: FULL CODE  TOTAL CRITICAL CARE TIME TAKING CARE OF THIS PATIENT: 40 minutes.   POSSIBLE D/C IN 1-2 DAYS, DEPENDING ON CLINICAL CONDITION.  [name] M.D on 03/01/2019 at 9:54 AM  Between 7am to 6pm - Pager - [phone]  After 6pm go to [email or web address] - password [email or web address] [place] [facility]  Office  [phone]  CC: Primary care physician; [name], MD  Note: This dictation was prepared with Dragon dictation along with smaller phrase technology. Any transcriptional errors that result from this process are unintentional.

## 2019-03-01 NOTE — Procedures (Signed)
Endotracheal Intubation: Patient required placement of an artificial airway secondary to Respiratory Failure  Consent: Emergent.   Hand washing performed prior to starting the procedure.   Medications administered for sedation prior to procedure:  Midazolam 4 mg IV,  Vecuronium 10 mg IV, Fentanyl 100 mcg IV.    A time out procedure was called and correct patient, name, & ID confirmed. Needed supplies and equipment were assembled and checked to include ETT, 10 ml syringe, Glidescope, Mac and Miller blades, suction, oxygen and bag mask valve, end tidal CO2 monitor.   Patient was positioned to align the mouth and pharynx to facilitate visualization of the glottis.   Heart rate, SpO2 and blood pressure was continuously monitored during the procedure. Pre-oxygenation was conducted prior to intubation and endotracheal tube was placed through the vocal cords into the trachea.     The artificial airway was placed under direct visualization via glidescope route using a 8.0 ETT on the first attempt.  ETT was secured at 23 cm mark.  Placement was confirmed by auscuitation of lungs with good breath sounds bilaterally and no stomach sounds.  Condensation was noted on endotracheal tube.   Pulse ox 98%.  CO2 detector in place with appropriate color change.   Complications: None .   Operator: Gilberto Streck.   Chest radiograph ordered and pending.   Comments: OGT placed via glidescope.  Micaella Gitto David Sirr Kabel, M.D.  [facility]  Medical Director ICU-[facility] Drain Medical Director [facility] Cardio-Pulmonary Department       

## 2019-03-01 NOTE — Progress Notes (Signed)
During rounds, Ch f/u w/ pt to give a prayer shawl. Pt was initially in telemetry but recently moved to [place] stepdown. [place] assisted w/ locating the pt. Pt had just recently been intubated as reported by  Staff. Pt was resting and did not present to be in pain. Intensivist was in contact w/ the family to explain the status of pt. CH was informed that pt was also recently changed from DNR to full code. F/u w/ provider and Ch Springer to determine the best way to support the family and ensure that the pt's needs are being met.    03/01/19 1200  Clinical Encounter Type  Visited With Health care provider  Visit Type Follow-up;Psychological support;Spiritual support;Social support;Critical Care  Spiritual Encounters  Spiritual Needs Other (Comment) (progress update )  Stress Factors  Patient Stress Factors Health changes;Exhausted;Major life changes  Family Stress Factors Exhausted;Family relationships;Lack of caregivers;Loss;Loss of control;Major life changes

## 2019-03-01 NOTE — Progress Notes (Signed)
Daily Progress Note   Patient Name: Yolanda Wells       Date: 03/01/2019 DOB: 05/23/1932  Age: 85 y.o. MRN#: [id number] Attending Physician: Hillary Bow, MD Primary Care Physician: [name], MD Admit Date: 02/14/2019  Reason for Consultation/Follow-up: Establishing goals of care  Subjective: Patient moved back to ICU due to severe respiratory distress with failed attempts to correct with BiPAP. She remains lethargic. Now requiring emergent intubation for airway support.   Spoke with family, [name] (granddaughter) and Reita Cliche (son) via phone. Provided updates on patients critical condition. Discussed specifically patients respiratory distress requiring intubation, poor prognosis, and future needs which may include tracheostomy and peg. Family tearful in conversation. Multiple family members included during the code by [name] and all tearful. They collectively all made decision and expressed wishes to proceed with full aggressive medical interventions including intubation, despite awareness of poor prognosis and risk of sudden cardiac arrest.   All family expressing they want "everything done" at this point. Allowed time for family to express emotions. Family reports they would like to continue their discussions amongst themselves in regards to their thoughts and feelings about the possibility of trach and peg if needed. Son states he is not sure they would take her level of care that far, however they must discuss and make further decisions. Support given.   All questions answered. Family appreciative of updates and care being provided during this difficult time of COVID-19.   Length of Stay: 8  Current Medications: Scheduled Meds:  . aspirin  81 mg Per Tube Daily  . budesonide  (PULMICORT) nebulizer solution  0.5 mg Nebulization BID  . chlorhexidine  15 mL Mouth Rinse BID  . insulin aspart  0-9 Units Subcutaneous Q4H  . ipratropium-albuterol  3 mL Nebulization Q6H  . mouth rinse  15 mL Mouth Rinse q12n4p  . midazolam PF  4 mg Intravenous Once  . pantoprazole sodium  40 mg Per Tube Daily  . senna-docusate  1 tablet Oral BID  . sodium chloride flush  3 mL Intravenous Q12H    Continuous Infusions: . sodium chloride Stopped (03/01/19 0949)  . ceFEPime (MAXIPIME) IV 2 g (03/01/19 0949)  . dexmedetomidine (PRECEDEX) IV infusion 1 mcg/kg/hr (03/01/19 1137)  . vancomycin      PRN Meds: sodium chloride, acetaminophen **OR** acetaminophen, fentaNYL (SUBLIMAZE) injection,  ipratropium-albuterol, lip balm, metoprolol tartrate, midazolam, [DISCONTINUED] ondansetron **OR** ondansetron (ZOFRAN) IV, polyethylene glycol  Physical Exam Vitals signs and nursing note reviewed.  Constitutional:      Appearance: She is obese. She is ill-appearing.     Interventions: She is intubated.     Comments: Critical and chronically ill   Cardiovascular:     Rate and Rhythm: Normal rate and regular rhythm.     Pulses: Normal pulses.  Pulmonary:     Effort: She is intubated.  Neurological:     Mental Status: She is lethargic.             Vital Signs: BP 122/73 (BP Location: Right Wrist)   Pulse 86   Temp (!) 101.1 F (38.4 C) (Axillary)   Resp 16   Ht 5\' 2"  (1.575 m)   Wt 88.1 kg   SpO2 98%   BMI 35.53 kg/m  SpO2: SpO2: 98 % O2 Device: O2 Device: Ventilator O2 Flow Rate: O2 Flow Rate (L/min): 2 L/min  Intake/output summary:   Intake/Output Summary (Last 24 hours) at 03/01/2019 1152 Last data filed at 03/01/2019 1100 Gross per 24 hour  Intake 2593.47 ml  Output 300 ml  Net 2293.47 ml   LBM: Last BM Date: 02/28/19 Baseline Weight: Weight: 90 kg Most recent weight: Weight: 88.1 kg       Palliative Assessment/Data: INTUBATED     Flowsheet Rows     Most Recent  Value  Intake Tab  Referral Department  Hospitalist  Unit at Time of Referral  Med/Surg Unit  Palliative Care Primary Diagnosis  Pulmonary  Date Notified  02/28/2019  Palliative Care Type  Return patient Palliative Care  Reason for referral  Clarify Goals of Care  Date of Admission  02/09/2019  Date first seen by Palliative Care  02/22/19  # of days Palliative referral response time  1 Day(s)  # of days IP prior to Palliative referral  0  Clinical Assessment  Palliative Performance Scale Score  30%  Pain Max last 24 hours  Not able to report  Pain Min Last 24 hours  Not able to report  Dyspnea Max Last 24 Hours  Not able to report  Dyspnea Min Last 24 hours  Not able to report  Psychosocial & Spiritual Assessment  Palliative Care Outcomes      Patient Active Problem List   Diagnosis Date Noted  . Acute respiratory failure with hypoxia and hypercapnia (HCC)   . Acute on chronic respiratory failure ([place]) 02/19/2019  . Decreased appetite 02/02/2019  . Poor appetite 12/24/2018  . Unsteady 12/24/2018  . Palliative care encounter 12/24/2018  . SOB (shortness of breath) 10/28/2018  . Shortness of breath 09/27/2018  . COPD exacerbation ([place]) 09/22/2018  . Sepsis ([place]) 09/22/2018  . Lower GI bleed 09/09/2018  . AMS (altered mental status) 08/30/2018  . Pressure injury of skin 08/13/2018  . B12 deficiency 08/04/2018  . Anemia 06/07/2018  . Secondary myelofibrosis ([place]) 02/17/2018  . Goals of care, counseling/discussion 12/23/2017  . Palliative care by specialist   . DNR (do not resuscitate) discussion   . Weakness generalized   . Chronic diastolic CHF (congestive heart failure) ([place]) 07/28/2017  . Vascular dementia without behavioral disturbance ([place]) 07/08/2017  . Influenza with respiratory manifestation 12/01/2016  . Respiratory distress 11/23/2016  . Urinary incontinence in female 09/02/2016  . Vision loss of right eye 08/26/2016  . Blindness of right eye 08/26/2016  .  Inability to ambulate due to hip 10/12/2015  .  Colostomy in place [facility]) 10/04/2015  . Long term current use of anticoagulant therapy 10/03/2015  . Leukocytosis 09/25/2015  . CAP (community acquired pneumonia) 09/24/2015  . COPD (chronic obstructive pulmonary disease) ([place]) 09/24/2015  . UTI (urinary tract infection) 07/16/2015  . Abdominal wall cellulitis 07/15/2015  . DVT (deep venous thrombosis) ([place]) 07/15/2015  . HTN (hypertension) 07/15/2015  . Polycythemia vera ([place]) 07/15/2015  . Arthritis 07/15/2015  . Pulmonary emboli ([place]) 05/25/2015  . Diverticulosis of colon with hemorrhage 04/02/2015  . Diverticulosis of intestine with bleeding 04/02/2015  . Fall 01/10/2015  . Osteoarthritis 01/10/2015  . Hammertoe 09/02/2012  . S/P transmetatarsal amputation of foot ([place]) 09/02/2012  . Onychomycosis 09/02/2012  . Other specified dermatoses 09/02/2012  . S/P hip replacement 08/23/2012  . Glenohumeral arthritis 06/28/2012    Palliative Care Assessment & Plan   Patient Profile: Per [name], NP: 85 y.o. female  with past medical history of  heart failure, long-term anticoagulation use, polycythemia vera, collagen vascular disease cerebellar stroke, history of PE with IVC filter, history of DVT left lower extremity / right lower extremity, bilateral hip replacements, osteoarthritis, diverticulosis with intestinal bleeding, colostomy 2016, hypertension, arthritis, partial right foot amputation admitted on 02/13/2019 with acute on chronic hypoxic/hypercarbic respiratory failure likely secondary to acute on chronic diastolic heart failure.  PMT consulted for goals of care, 8 admissions and 3 ED visits in 6 months  Recommendations/Plan:  Full Code  Full aggressive medical interventions  Family in discussion about how to proceed with care in future, trach, peg, limitations to intubation period.   PMT will continue to follow and support   Goals of Care and Additional Recommendations:   Limitations on Scope of Treatment: Full Scope Treatment  Code Status:    Code Status Orders  (From admission, onward)         Start     Ordered   02/25/2019 1349  Full code  Continuous     02/16/2019 1349        Code Status History    Date Active Date Inactive Code Status Order ID Comments User Context   02/18/2019 1813 02/20/2019 2309 Full Code [id number]  Otila Back, MD Inpatient   11/08/2018 1532 11/10/2018 2032 DNR [id number]  Dustin Flock, MD Inpatient   10/28/2018 1334 10/31/2018 1621 DNR [id number]  Dustin Flock, MD Inpatient   09/27/2018 1901 09/30/2018 1545 DNR [id number]  Loletha Grayer, MD ED   09/22/2018 0133 09/25/2018 2201 Full Code [id number]  Amelia Jo, MD Inpatient   09/09/2018 1240 09/12/2018 2008 Full Code [id number]  Loletha Grayer, MD ED   08/30/2018 2018 09/01/2018 2004 DNR [id number]  Nicholes Mango, MD ED   08/30/2018 1903 08/30/2018 2018 Full Code [id number]  Nicholes Mango, MD ED   08/13/2018 1038 08/15/2018 1836 Full Code [id number]  Loletha Grayer, MD Inpatient   08/12/2018 2001 08/13/2018 1038 DNR [id number]  Gorden Harms, MD ED   08/12/2018 2001 08/12/2018 2001 Full Code [id number]  Gorden Harms, MD ED   06/15/2018 1635 06/18/2018 2009 Full Code [id number]  [name], MD Inpatient   02/24/2018 0347 02/27/2018 0027 Full Code [id number]  Amelia Jo, MD Inpatient   10/15/2017 1141 10/16/2017 2204 Full Code [id number]  Knox Royalty, NP Inpatient   10/13/2017 2246 10/15/2017 1141 DNR [id number]  Lance Coon, MD ED   10/06/2017 1454 10/09/2017 2058 DNR [id number]  Nicholes Mango, MD Inpatient   10/06/2017 1111 10/06/2017 1453 Full Code [id number]  Nicholes Mango, MD Inpatient   05/27/2017 1954 05/28/2017 2013  Full Code [id number]  Vaughan Basta, MD Inpatient   05/23/2017 2322 05/26/2017 1614 Full Code [id number]  Lance Coon, MD Inpatient   04/10/2017 0325 04/11/2017 1916 Full Code [id number]  Harrie Foreman, MD Inpatient   11/23/2016 0629 12/01/2016 1935 Full  Code [id number]  Harrie Foreman, MD Inpatient   08/26/2016 0445 08/26/2016 1923 Full Code [id number]  Saundra Shelling, MD Inpatient   09/22/2015 1755 09/25/2015 1844 Full Code [id number]  Demetrios Loll, MD ED   09/15/2015 0341 09/17/2015 1741 Full Code [id number]  Harrie Foreman, MD Inpatient   07/16/2015 0151 07/20/2015 2334 Full Code [id number]  Lance Coon, MD Inpatient   05/25/2015 0834 05/29/2015 1616 Full Code [id number]  Harrie Foreman, MD Inpatient   04/02/2015 0606 04/08/2015 1651 Full Code [id number]  Harrie Foreman, MD Inpatient      Prognosis:   POOR   Discharge Planning:  To Be Determined  Care plan was discussed with Dr. Darvin Neighbours and Dr. Mortimer Fries.   The above conversation was completed via telephone due to the visitor restrictions during the COVID-19 pandemic. Thorough chart review and discussion with necessary members of the care team was completed as part of assessment.   Thank you for allowing the Palliative Medicine Team to assist in the care of this patient.  Total Time: 35 min.   Greater than 50%  of this time was spent counseling and coordinating care related to the above assessment and plan  Alda Lea, AGPCNP-BC Palliative Medicine Team  Pager: [phone] Amion: Bjorn Pippin   Please contact Palliative Medicine Team phone at [phone] for questions and concerns.

## 2019-03-01 NOTE — Progress Notes (Signed)
Initial Nutrition Assessment  DOCUMENTATION CODES:   Obesity unspecified  INTERVENTION:  Initiate Vital High Protein at 50 mL/hr (1200 mL goal daily volume) per NGT. Provides 1200 kcal, 105 grams of protein, 1008 mL H2O daily.  Provide liquid MVI daily per tube.  Provide minimum free water flush of 30 mL Q4hrs to maintain tube patency.  NUTRITION DIAGNOSIS:   Inadequate oral intake related to inability to eat as evidenced by NPO status.  GOAL:   Provide needs based on ASPEN/SCCM guidelines  MONITOR:   Vent status, Labs, Weight trends, TF tolerance, Skin, I & O's  REASON FOR ASSESSMENT:   Ventilator, Consult Enteral/tube feeding initiation and management  ASSESSMENT:   85 year old female with PMHx of HTN, hx DVT, arthritis, hx CVA 2016, diverticulosis, CHF, dementia admitted with acute exacerbation of COPD requiring intubation on 4/21.   Patient intubated and sedated. On PRVC mode with FiO2 40% and PEEP 5 cmH2O. Per chart patient on 2L chronic O2 at home, is wheel-chair bound, and needs total care at baseline. Weight in chart tends to fluctuate, which may be related to fluid status changes or also difficulty in weighing a wheel-chair bound patient. She is currently 88.1 kg (194.28 lbs).  Enteral Access: NGT placed 4/21; terminates in distal stomach or proximal duodenum per abdominal x-ray 4/21; cm marking not yet documented in chart  MAP: 84-97 mmHg  Patient is currently intubated on ventilator support Ve: 7.3 L/min Temp (24hrs), Avg:100.3 F (37.9 C), Min:98.9 F (37.2 C), Max:101.1 F (38.4 C)  Propofol: N/A  Medications reviewed and include: Novolog 0-9 units Q4hrs, pantoprazole, cefepime, Precedex gtt, vancomycin.  Labs reviewed: CBG 106, Sodium 147, CO2 34, BUN 7.  I/O: 300 mL output from colostomy today  NUTRITION - FOCUSED PHYSICAL EXAM:  Unable to complete at this time.  Diet Order:   Diet Order            Diet regular Room service appropriate?  Yes; Fluid consistency: Thin  Diet effective now             EDUCATION NEEDS:   No education needs have been identified at this time  Skin:  Skin Assessment: Skin Integrity Issues:(unstageable coccyx; stage II buttocks; MSAD buttocks; ecchymosis; s/p amputation of toes)  Last BM:  03/01/2019 - 300 mL colostomy  Height:   Ht Readings from Last 1 Encounters:  02/18/2019 5\' 2"  (1.575 m)   Weight:   Wt Readings from Last 1 Encounters:  03/01/19 88.1 kg   Ideal Body Weight:  50 kg  BMI:  Body mass index is 35.53 kg/m.  Estimated Nutritional Needs:   Kcal:  [phone] (11-14 kcal/kg)  Protein:  100 grams (2 grams/kg IBW)  Fluid:  1.25 L/day (25 mL/kg IBW)  Willey Blade, MS, RD, LDN Office: [phone] Pager: [phone] After Hours/Weekend Pager: [phone]

## 2019-03-01 NOTE — Progress Notes (Signed)
CRITICAL CARE NOTE  CC  Transferred to ICU for severe resp failure  SUBJECTIVE Patient is critically ill Prognosis is very poor Patient with severe resp distress Failed BiPAP Patient emergently intubated CXR shows new RT sided opacity c/w pneumonia re-started ABX  Active Ambulatory Problems    Diagnosis Date Noted  . Diverticulosis of colon with hemorrhage 04/02/2015  . Pulmonary emboli ([place]) 05/25/2015  . Abdominal wall cellulitis 07/15/2015  . DVT (deep venous thrombosis) ([place]) 07/15/2015  . HTN (hypertension) 07/15/2015  . Polycythemia vera ([place]) 07/15/2015  . Arthritis 07/15/2015  . UTI (urinary tract infection) 07/16/2015  . CAP (community acquired pneumonia) 09/24/2015  . Leukocytosis 09/25/2015  . Long term current use of anticoagulant therapy 10/03/2015  . COPD (chronic obstructive pulmonary disease) ([place]) 09/24/2015  . Colostomy in place [facility] 10/04/2015  . Diverticulosis of intestine with bleeding 04/02/2015  . Fall 01/10/2015  . Glenohumeral arthritis 06/28/2012  . Hammertoe 09/02/2012  . S/P transmetatarsal amputation of foot ([place]) 09/02/2012  . S/P hip replacement 08/23/2012  . Inability to ambulate due to hip 10/12/2015  . Onychomycosis 09/02/2012  . Other specified dermatoses 09/02/2012  . Vision loss of right eye 08/26/2016  . Blindness of right eye 08/26/2016  . Respiratory distress 11/23/2016  . Influenza with respiratory manifestation 12/01/2016  . Osteoarthritis 01/10/2015  . Urinary incontinence in female 09/02/2016  . Chronic diastolic CHF (congestive heart failure) ([employer]) 07/28/2017  . Palliative care by specialist   . DNR (do not resuscitate) discussion   . Weakness generalized   . Goals of care, counseling/discussion 12/23/2017  . Secondary myelofibrosis ([place]) 02/17/2018  . Anemia 06/07/2018  . B12 deficiency 08/04/2018  . Vascular dementia without behavioral disturbance ([place]) 07/08/2017  . Pressure injury of skin 08/13/2018  . AMS  (altered mental status) 08/30/2018  . Lower GI bleed 09/09/2018  . COPD exacerbation ([place]) 09/22/2018  . Sepsis ([place]) 09/22/2018  . Shortness of breath 09/27/2018  . SOB (shortness of breath) 10/28/2018  . Poor appetite 12/24/2018  . Unsteady 12/24/2018  . Palliative care encounter 12/24/2018  . Decreased appetite 02/02/2019   Resolved Ambulatory Problems    Diagnosis Date Noted  . Dependent edema 07/15/2015  . Elevated troponin 09/15/2015  . SOB (shortness of breath) 09/22/2015  . COPD exacerbation ([place]) 09/24/2015  . Chest pain 09/25/2015  . Dehydration, moderate 01/11/2015  . Hip pain 08/23/2012  . Acute pulmonary embolism ([place]) 10/03/2015  . Ileus ([place]) 04/10/2017  . Dyspnea 05/27/2017  . Acute CHF (congestive heart failure) ([place]) 10/06/2017  . Hypoxia 10/13/2017  . Acute deep vein thrombosis (DVT) of left femoral vein ([place]) 02/17/2018  . Acute respiratory failure ([place]) 02/23/2018   Past Medical History:  Diagnosis Date  . Acute deep vein thrombosis (DVT) of distal vein of left lower extremity ([place]) 10/03/2015  . Anticoagulant long-term use   . CHF (congestive heart failure) ([place])   . Chronic anticoagulation 10/03/2015  . Collagen vascular disease ([place])   . Deep venous thrombosis ([place])   . History of bilateral hip replacements 09/02/2012  . History of hysterectomy   . Hypertension   . Hypertension   . Hypokalemia   . Mandibular dysfunction   . Presence of IVC filter 05/25/2015  . Stroke ([place]) 03/26/2015    Previous Dx of DEMENTIA, chronic oxygen therapy 2L for COPD Wheelchair bound(she needs total care at home) very poor quality of life  SIGNIFICANT EVENTS 4/10 admitted for COPD exacerbation  4/12 discharged to home 4/13 re-admitted for COPD exacerbation,  SVT 4/21 re-admitted to ICU for new RT sided pneumonia and severe resp failure, intubated #8 ETT    REVIEW OF SYSTEMS  PATIENT IS UNABLE TO PROVIDE COMPLETE REVIEW OF SYSTEMS DUE TO SEVERE CRITICAL  ILLNESS   PHYSICAL EXAMINATION:  GENERAL:critically ill appearing, +resp distress HEAD: Normocephalic, atraumatic.  EYES: Pupils equal, round, reactive to light.  No scleral icterus.  MOUTH: Moist mucosal membrane. NECK: Supple. No thyromegaly. No nodules. No JVD.  PULMONARY: +rhonchi, +wheezing CARDIOVASCULAR: S1 and S2. Regular rate and rhythm. No murmurs, rubs, or gallops.  GASTROINTESTINAL: Soft, nontender, -distended. No masses. Positive bowel sounds. No hepatosplenomegaly.  MUSCULOSKELETAL: No swelling, clubbing, or edema.  NEUROLOGIC: obtunded, GCS<8 SKIN:intact,warm,dry    CULTURE RESULTS   Recent Results (from the past 240 hour(s))  MRSA PCR Screening     Status: Abnormal   Collection Time: 02/23/2019  3:40 PM  Result Value Ref Range Status   MRSA by PCR POSITIVE (A) NEGATIVE Final    Comment:        The GeneXpert MRSA Assay (FDA approved for NASAL specimens only), is one component of a comprehensive MRSA colonization surveillance program. It is not intended to diagnose MRSA infection nor to guide or monitor treatment for MRSA infections. RESULT CALLED TO, READ BACK BY AND VERIFIED WITH: C GREEN 02/15/2019 AT 1656 BY [facility] Performed at [facility], [address] [place], [place] [address]   Urine Culture     Status: Abnormal   Collection Time: 02/22/19  3:45 PM  Result Value Ref Range Status   Specimen Description   Final    URINE, RANDOM Performed at [facility], [address] [place], [place] [address]    Special Requests   Final    Normal Performed at [facility], [address] [place], [place] [address]    Culture MULTIPLE SPECIES PRESENT, SUGGEST RECOLLECTION (A)  Final   Report Status 02/23/2019 FINAL  Final  CULTURE, BLOOD (ROUTINE X 2) w Reflex to ID Panel     Status: None   Collection Time: 02/22/19  4:55 PM  Result Value Ref Range Status   Specimen Description BLOOD BLOOD RIGHT HAND  Final   Special  Requests   Final    BOTTLES DRAWN AEROBIC AND ANAEROBIC Blood Culture adequate volume   Culture   Final    NO GROWTH 5 DAYS Performed at [facility], [address] [place], [place] [address]    Report Status 02/27/2019 FINAL  Final  CULTURE, BLOOD (ROUTINE X 2) w Reflex to ID Panel     Status: None   Collection Time: 02/22/19  5:01 PM  Result Value Ref Range Status   Specimen Description BLOOD BLOOD LEFT HAND  Final   Special Requests   Final    BOTTLES DRAWN AEROBIC AND ANAEROBIC Blood Culture adequate volume   Culture   Final    NO GROWTH 5 DAYS Performed at [facility], [address] [place], [place] [address]    Report Status 02/27/2019 FINAL  Final  SARS Coronavirus 2 [facility] order, Performed in [place] hospital lab)     Status: None   Collection Time: 02/27/19 11:11 PM  Result Value Ref Range Status   SARS Coronavirus 2 NEGATIVE NEGATIVE Final    Comment: (NOTE) If result is NEGATIVE SARS-CoV-2 target nucleic acids are NOT DETECTED. The SARS-CoV-2 RNA is generally detectable in upper and lower  respiratory specimens during the acute phase of infection. The lowest  concentration of SARS-CoV-2 viral copies this assay can detect is 250  copies / mL. A negative result does not preclude SARS-CoV-2 infection  and should not be used as the sole basis for treatment or other  patient management decisions.  A negative result may occur with  improper specimen collection / handling, submission of specimen other  than nasopharyngeal swab, presence of viral mutation(s) within the  areas targeted by this assay, and inadequate number of viral copies  (<250 copies / mL). A negative result must be combined with clinical  observations, patient history, and epidemiological information. If result is POSITIVE SARS-CoV-2 target nucleic acids are DETECTED. The SARS-CoV-2 RNA is generally detectable in upper and lower  respiratory specimens dur ing the acute phase  of infection.  Positive  results are indicative of active infection with SARS-CoV-2.  Clinical  correlation with patient history and other diagnostic information is  necessary to determine patient infection status.  Positive results do  not rule out bacterial infection or co-infection with other viruses. If result is PRESUMPTIVE POSTIVE SARS-CoV-2 nucleic acids MAY BE PRESENT.   A presumptive positive result was obtained on the submitted specimen  and confirmed on repeat testing.  While 2019 novel coronavirus  (SARS-CoV-2) nucleic acids may be present in the submitted sample  additional confirmatory testing may be necessary for epidemiological  and / or clinical management purposes  to differentiate between  SARS-CoV-2 and other Sarbecovirus currently known to infect humans.  If clinically indicated additional testing with an alternate test  methodology [phone]) is advised. The SARS-CoV-2 RNA is generally  detectable in upper and lower respiratory sp ecimens during the acute  phase of infection. The expected result is Negative. Fact Sheet for Patients:  StrictlyIdeas.no Fact Sheet for Healthcare Providers: BankingDealers.co.za This test is not yet approved or cleared by the [place] FDA and has been authorized for detection and/or diagnosis of SARS-CoV-2 by FDA under an Emergency Use Authorization (EUA).  This EUA will remain in effect (meaning this test can be used) for the duration of the COVID-19 declaration under Section 564(b)(1) of the Act, 21 U.S.C. section 360bbb-3(b)(1), unless the authorization is terminated or revoked sooner. Performed at [facility], [address] [place], [place] [address]   CULTURE, BLOOD (ROUTINE X 2) w Reflex to ID Panel     Status: None (Preliminary result)   Collection Time: 02/28/19  1:40 AM  Result Value Ref Range Status   Specimen Description BLOOD RIGHT HAND  Final   Special  Requests   Final    BOTTLES DRAWN AEROBIC ONLY Blood Culture results may not be optimal due to an inadequate volume of blood received in culture bottles   Culture   Final    NO GROWTH 1 DAY Performed at [facility], [address], [place], [place] [address]    Report Status PENDING  Incomplete  CULTURE, BLOOD (ROUTINE X 2) w Reflex to ID Panel     Status: None (Preliminary result)   Collection Time: 02/28/19  2:05 AM  Result Value Ref Range Status   Specimen Description BLOOD LEFT HAND  Final   Special Requests   Final    BOTTLES DRAWN AEROBIC AND ANAEROBIC Blood Culture adequate volume   Culture   Final    NO GROWTH 1 DAY Performed at [facility], [address], [place], [place] [address]    Report Status PENDING  Incomplete          IMAGING  I have Independently reviewed images of  CXR   on 03/01/2019 Interpretation:RT sided opacity, pneumonia     Indwelling Urinary Catheter continued, requirement due to   Reason to continue Indwelling Urinary Catheter for strict Intake/Output monitoring for hemodynamic instability         Ventilator continued, requirement due to, resp failure    Ventilator Sedation RASS 0 to -2     ASSESSMENT AND PLAN SYNOPSIS   Severe ACUTE Hypoxic and Hypercapnic Respiratory Failure -continue Full MV support -continue Bronchodilator Therapy -Wean Fio2 and PEEP as tolerated  SEVERE COPD EXACERBATION -continue IV steroids as prescribed -continue NEB THERAPY as prescribed -morphine as needed -wean fio2 as needed and tolerated    NEUROLOGY - intubated and sedated - minimal sedation to achieve a RASS goal: -1 Wake up assessment pending  CARDIAC ICU monitoring  ID-treat for HCAP -continue IV abx as prescibed -follow up cultures  GI/Nutrition GI PROPHYLAXIS as indicated DIET-->start TF's as tolerated Constipation protocol as indicated  ENDO - ICU hypoglycemic\Hyperglycemia protocol -check FSBS  per protocol   ELECTROLYTES -follow labs as needed -replace as needed -pharmacy consultation and following   DVT/GI PRX ordered TRANSFUSIONS AS NEEDED MONITOR FSBS ASSESS the need for LABS as needed   Critical Care Time devoted to patient care services described in this note is 54  minutes.   Overall, patient is critically ill, prognosis is guarded.  High risk for cardiac arrest and death   Corrin Parker, M.D.  [facility]  Medical Director [place] Director [facility] Cardio-Pulmonary Department

## 2019-03-01 NOTE — Progress Notes (Signed)
Pastoral Care Visit   03/01/19 1030  Clinical Encounter Type  Visited With Patient and family together;Health care provider  Visit Type Follow-up;Critical Care  Consult/Referral To Chaplain  Spiritual Encounters  Spiritual Needs Other (Comment);Emotional;Prayer  Stress Factors  Patient Stress Factors Health changes  Family Stress Factors Health changes;Loss of control;Other (Comment) (HCPOA wishes)   When chap arrived pt was intubated and unable to communicate.  MD was on phone w/ granddaughter.  I called granddaughter later to confirm her understanding of grandmother's condition and end of life wishes.  The granddaughter, [name], added pt's son Darryl to the call as well.  They read off to me the pt's wishes from her POA ppwk.  I requested they send me a copy of the ppwk to put in pt folder.  Received, forwarded to Unit [employer]. She printed and placed in pt chart.  [name] requested to visit her grandmother.  I asked MD who declined.  I did call the pt granddaughter [name] using Duo App so that she could see her grandmother and the numbers on the pt monitor screen.. Pt granddaughter was thankful and tearful.  Fam is having difficult time coping with not being present with their loved one.    Darcey Nora, Chaplain

## 2019-03-01 NOTE — TOC Progression Note (Signed)
Transition of Care [facility]) - Progression Note    Patient Details  Name: Cyleigh Massaro MRN: [id number] Date of Birth: 1932-03-27  Transition of Care [facility]) CM/SW Contact  Shelbie Hutching, RN Phone Number: 03/01/2019, 11:09 AM  Clinical Narrative:    Patient transferred to ICU for respiratory distress on Bipap.  Patient intubated on arrival to ICU.  Patient remains a full code, palliative is following and has updated the family.    Expected Discharge Plan: [place] Barriers to Discharge: Continued Medical Work up  Expected Discharge Plan and Services Expected Discharge Plan: [place] arrangements for the past 2 months: [place] Agency: [place]   Social Determinants of Health (SDOH) Interventions    Readmission Risk Interventions Readmission Risk Prevention Plan 02/23/2019 09/25/2018 09/01/2018  Transportation Screening Complete Complete Complete  Medication Review Press photographer) Complete Complete Complete  PCP or Specialist appointment within 3-5 days of discharge - Complete Complete  HRI or Home Care Consult Complete Complete Complete  SW Recovery Care/Counseling Consult - Complete Complete  Palliative Care Screening Complete Complete Complete  Medication Reconcilation (Pharmacy) - Complete Complete  Skilled Nursing Facility Not Applicable Complete Not Complete  Some recent data might be hidden

## 2019-03-01 NOTE — Progress Notes (Addendum)
Patient very lethargic on morning rounds. Responds to voice only, no reaction from sternal rub. MD notified. Orders to put patient on BIPAP and get an ABG. VSS taken ( see flowsheet).Patient will be transferring to stepdown. Patient currently on BIPAP, MD will call granddaughter to update.   Update: Report given to [facility] ICU RN. Patient transferred to ICU 4.

## 2019-03-02 ENCOUNTER — Inpatient Hospital Stay: Payer: Self-pay

## 2019-03-02 DIAGNOSIS — J441 Chronic obstructive pulmonary disease with (acute) exacerbation: Secondary | ICD-10-CM

## 2019-03-02 DIAGNOSIS — J189 Pneumonia, unspecified organism: Secondary | ICD-10-CM

## 2019-03-02 LAB — PROCALCITONIN: Procalcitonin: 0.31 ng/mL

## 2019-03-02 LAB — BASIC METABOLIC PANEL
Anion gap: 6 (ref 5–15)
BUN: 14 mg/dL (ref 8–23)
CO2: 31 mmol/L (ref 22–32)
Calcium: 7.9 mg/dL — ABNORMAL LOW (ref 8.9–10.3)
Chloride: 104 mmol/L (ref 98–111)
Creatinine, Ser: 0.53 mg/dL (ref 0.44–1.00)
GFR calc Af Amer: >60 mL/min (ref >60)
GFR calc non Af Amer: >60 mL/min (ref >60)
Glucose, Bld: 222 mg/dL — ABNORMAL HIGH (ref 70–99)
Potassium: 3.2 mmol/L — ABNORMAL LOW (ref 3.5–5.1)
Sodium: 141 mmol/L (ref 135–145)

## 2019-03-02 LAB — CBC
HCT: 28.3 % — ABNORMAL LOW (ref 36.0–46.0)
Hemoglobin: 7.9 g/dL — ABNORMAL LOW (ref 12.0–15.0)
MCH: 23.9 pg — ABNORMAL LOW (ref 26.0–34.0)
MCHC: 27.9 g/dL — ABNORMAL LOW (ref 30.0–36.0)
MCV: 85.5 fL (ref 80.0–100.0)
Platelets: 149 10*3/uL — ABNORMAL LOW (ref 150–400)
RBC: 3.31 MIL/uL — ABNORMAL LOW (ref 3.87–5.11)
RDW: 29.3 % — ABNORMAL HIGH (ref 11.5–15.5)
WBC: 9.6 10*3/uL (ref 4.0–10.5)
nRBC: 3.8 % — ABNORMAL HIGH (ref 0.0–0.2)

## 2019-03-02 LAB — BLOOD GAS, ARTERIAL
Acid-Base Excess: 9.1 mmol/L — ABNORMAL HIGH (ref 0.0–2.0)
Bicarbonate: 34.2 mmol/L — ABNORMAL HIGH (ref 20.0–28.0)
FIO2: 0.4
MECHVT: 450 mL
O2 Saturation: 99.4 %
Patient temperature: 37
RATE: 14 resp/min
pCO2 arterial: 47 mmHg (ref 32.0–48.0)
pH, Arterial: 7.47 — ABNORMAL HIGH (ref 7.350–7.450)
pO2, Arterial: 145 mmHg — ABNORMAL HIGH (ref 83.0–108.0)

## 2019-03-02 LAB — GLUCOSE, CAPILLARY
Glucose-Capillary: 145 mg/dL — ABNORMAL HIGH (ref 70–99)
Glucose-Capillary: 161 mg/dL — ABNORMAL HIGH (ref 70–99)
Glucose-Capillary: 168 mg/dL — ABNORMAL HIGH (ref 70–99)
Glucose-Capillary: 174 mg/dL — ABNORMAL HIGH (ref 70–99)
Glucose-Capillary: 178 mg/dL — ABNORMAL HIGH (ref 70–99)
Glucose-Capillary: 178 mg/dL — ABNORMAL HIGH (ref 70–99)
Glucose-Capillary: 235 mg/dL — ABNORMAL HIGH (ref 70–99)

## 2019-03-02 LAB — PHOSPHORUS: Phosphorus: 2.9 mg/dL (ref 2.5–4.6)

## 2019-03-02 LAB — HEPARIN LEVEL (UNFRACTIONATED): Heparin Unfractionated: 0.32 IU/mL (ref 0.30–0.70)

## 2019-03-02 LAB — MAGNESIUM: Magnesium: 2.1 mg/dL (ref 1.7–2.4)

## 2019-03-02 MED ORDER — SODIUM CHLORIDE 0.9 % IV SOLN
250.0000 mL | INTRAVENOUS | Status: DC
  Administered 2019-03-02: 15:00:00 250 mL via INTRAVENOUS

## 2019-03-02 MED ORDER — SODIUM CHLORIDE 0.9 % IV SOLN
25.0000 ug/min | INTRAVENOUS | Status: DC
  Administered 2019-03-02: 25 ug/min via INTRAVENOUS
  Filled 2019-03-02: qty 1
  Filled 2019-03-02: qty 10

## 2019-03-02 MED ORDER — POTASSIUM CHLORIDE 20 MEQ PO PACK
40.0000 meq | PACK | ORAL | Status: AC
  Administered 2019-03-02 (×2): 40 meq
  Filled 2019-03-02 (×3): qty 2

## 2019-03-02 MED ORDER — NOREPINEPHRINE 4 MG/250ML-% IV SOLN
0.0000 ug/min | INTRAVENOUS | Status: DC
  Administered 2019-03-02: 16:00:00 2 ug/min via INTRAVENOUS
  Administered 2019-03-05: 1 ug/min via INTRAVENOUS
  Filled 2019-03-02 (×2): qty 250

## 2019-03-02 MED ORDER — MIDAZOLAM HCL 2 MG/2ML IJ SOLN
1.0000 mg | INTRAMUSCULAR | Status: DC | PRN
  Administered 2019-03-02 – 2019-03-03 (×9): 2 mg via INTRAVENOUS
  Filled 2019-03-02 (×9): qty 2

## 2019-03-02 MED ORDER — NOREPINEPHRINE 4 MG/250ML-% IV SOLN
INTRAVENOUS | Status: AC
  Filled 2019-03-02: qty 250

## 2019-03-02 MED ORDER — PHENYLEPHRINE HCL-NACL 10-0.9 MG/250ML-% IV SOLN
25.0000 ug/min | INTRAVENOUS | Status: DC
  Filled 2019-03-02: qty 250

## 2019-03-02 NOTE — Progress Notes (Signed)
Attempt to place PICC unsuccessful.  Attempt by two PICC nurses in [place]; Basilic Veins meeting resistance and some type of blockage  Unable to thread PICC

## 2019-03-02 NOTE — Progress Notes (Signed)
VAST RN consulted to check PICC, but patient does not currently have PICC. Called unit RN and determined the MD wants a PICC placed. VAST RN talked unit RN through process for ordering PICC. Unit RN will discuss with MD correct way to order PICC placement.

## 2019-03-02 NOTE — Progress Notes (Signed)
CRITICAL CARE NOTE  CC  Follow up severe resp failure  SUBJECTIVE Remains critically ill Poor prognosis Severe resp failure On vent support On ABX for pneumonia  Active Ambulatory Problems    Diagnosis Date Noted  . Diverticulosis of colon with hemorrhage 04/02/2015  . Pulmonary emboli ([place]) 05/25/2015  . Abdominal wall cellulitis 07/15/2015  . DVT (deep venous thrombosis) ([place]) 07/15/2015  . HTN (hypertension) 07/15/2015  . Polycythemia vera ([place]) 07/15/2015  . Arthritis 07/15/2015  . UTI (urinary tract infection) 07/16/2015  . CAP (community acquired pneumonia) 09/24/2015  . Leukocytosis 09/25/2015  . Long term current use of anticoagulant therapy 10/03/2015  . COPD (chronic obstructive pulmonary disease) ([place]) 09/24/2015  . Colostomy in place [facility]) 10/04/2015  . Diverticulosis of intestine with bleeding 04/02/2015  . Fall 01/10/2015  . Glenohumeral arthritis 06/28/2012  . Hammertoe 09/02/2012  . S/P transmetatarsal amputation of foot ([place]) 09/02/2012  . S/P hip replacement 08/23/2012  . Inability to ambulate due to hip 10/12/2015  . Onychomycosis 09/02/2012  . Other specified dermatoses 09/02/2012  . Vision loss of right eye 08/26/2016  . Blindness of right eye 08/26/2016  . Respiratory distress 11/23/2016  . Influenza with respiratory manifestation 12/01/2016  . Osteoarthritis 01/10/2015  . Urinary incontinence in female 09/02/2016  . Chronic diastolic CHF (congestive heart failure) ([place]) 07/28/2017  . Palliative care by specialist   . DNR (do not resuscitate) discussion   . Weakness generalized   . Goals of care, counseling/discussion 12/23/2017  . Secondary myelofibrosis ([place]) 02/17/2018  . Anemia 06/07/2018  . B12 deficiency 08/04/2018  . Vascular dementia without behavioral disturbance ([place]) 07/08/2017  . Pressure injury of skin 08/13/2018  . AMS (altered mental status) 08/30/2018  . Lower GI bleed 09/09/2018  . COPD exacerbation ([place]) 09/22/2018  .  Sepsis ([place]) 09/22/2018  . Shortness of breath 09/27/2018  . SOB (shortness of breath) 10/28/2018  . Poor appetite 12/24/2018  . Unsteady 12/24/2018  . Palliative care encounter 12/24/2018  . Decreased appetite 02/02/2019   Resolved Ambulatory Problems    Diagnosis Date Noted  . Dependent edema 07/15/2015  . Elevated troponin 09/15/2015  . SOB (shortness of breath) 09/22/2015  . COPD exacerbation ([place]) 09/24/2015  . Chest pain 09/25/2015  . Dehydration, moderate 01/11/2015  . Hip pain 08/23/2012  . Acute pulmonary embolism ([place]) 10/03/2015  . Ileus ([place]) 04/10/2017  . Dyspnea 05/27/2017  . Acute CHF (congestive heart failure) ([place]) 10/06/2017  . Hypoxia 10/13/2017  . Acute deep vein thrombosis (DVT) of left femoral vein ([place]) 02/17/2018  . Acute respiratory failure ([place]) 02/23/2018   Past Medical History:  Diagnosis Date  . Acute deep vein thrombosis (DVT) of distal vein of left lower extremity ([place]) 10/03/2015  . Anticoagulant long-term use   . CHF (congestive heart failure) ([place])   . Chronic anticoagulation 10/03/2015  . Collagen vascular disease ([place])   . Deep venous thrombosis ([place])   . History of bilateral hip replacements 09/02/2012  . History of hysterectomy   . Hypertension   . Hypertension   . Hypokalemia   . Mandibular dysfunction   . Presence of IVC filter 05/25/2015  . Stroke ([place]) 03/26/2015    Previous Dx of DEMENTIA, chronic oxygen therapy 2L for COPD Wheelchair bound(she needs total care at home) very poor quality of life  SIGNIFICANT EVENTS 4/10 admitted for COPD exacerbation  4/12 discharged to home 4/13 re-admitted for COPD exacerbation, SVT 4/21 re-admitted to ICU for new RT sided pneumonia and severe resp failure, intubated #  8 ETT   REVIEW OF SYSTEMS  PATIENT IS UNABLE TO PROVIDE COMPLETE REVIEW OF SYSTEM S DUE TO SEVERE CRITICAL ILLNESS AND ENCEPHALOPATHY  PHYSICAL EXAMINATION:  GENERAL:critically ill appearing, +resp distress HEAD:  Normocephalic, atraumatic.  EYES: Pupils equal, round, reactive to light.  No scleral icterus.  MOUTH: Moist mucosal membrane. NECK: Supple. No thyromegaly. No nodules. No JVD.  PULMONARY: +rhonchi, +wheezing CARDIOVASCULAR: S1 and S2. Regular rate and rhythm. No murmurs, rubs, or gallops.  GASTROINTESTINAL: Soft, nontender, -distended. No masses. Positive bowel sounds. No hepatosplenomegaly.  MUSCULOSKELETAL: No swelling, clubbing, or edema.  NEUROLOGIC: obtunded SKIN:intact,warm,dry        CULTURE RESULTS   Recent Results (from the past 240 hour(s))  MRSA PCR Screening     Status: Abnormal   Collection Time: 03/07/2019  3:40 PM  Result Value Ref Range Status   MRSA by PCR POSITIVE (A) NEGATIVE Final    Comment:        The GeneXpert MRSA Assay (FDA approved for NASAL specimens only), is one component of a comprehensive MRSA colonization surveillance program. It is not intended to diagnose MRSA infection nor to guide or monitor treatment for MRSA infections. RESULT CALLED TO, READ BACK BY AND VERIFIED WITH: C GREEN 02/26/2019 AT 1656 BY [facility] Performed at [facility], [address] [place], [place] [address]   Urine Culture     Status: Abnormal   Collection Time: 02/22/19  3:45 PM  Result Value Ref Range Status   Specimen Description   Final    URINE, RANDOM Performed at [facility], [address] [place], [place] [address]    Special Requests   Final    Normal Performed at [facility], [address] [place], [place] [address]    Culture MULTIPLE SPECIES PRESENT, SUGGEST RECOLLECTION (A)  Final   Report Status 02/23/2019 FINAL  Final  CULTURE, BLOOD (ROUTINE X 2) w Reflex to ID Panel     Status: None   Collection Time: 02/22/19  4:55 PM  Result Value Ref Range Status   Specimen Description BLOOD BLOOD RIGHT HAND  Final   Special Requests   Final    BOTTLES DRAWN AEROBIC AND ANAEROBIC Blood Culture adequate volume    Culture   Final    NO GROWTH 5 DAYS Performed at [facility], [address] [place], [place] [address]    Report Status 02/27/2019 FINAL  Final  CULTURE, BLOOD (ROUTINE X 2) w Reflex to ID Panel     Status: None   Collection Time: 02/22/19  5:01 PM  Result Value Ref Range Status   Specimen Description BLOOD BLOOD LEFT HAND  Final   Special Requests   Final    BOTTLES DRAWN AEROBIC AND ANAEROBIC Blood Culture adequate volume   Culture   Final    NO GROWTH 5 DAYS Performed at [facility], [address] [place], [place] [address]    Report Status 02/27/2019 FINAL  Final  SARS Coronavirus 2 [facility] order, Performed in [place] hospital lab)     Status: None   Collection Time: 02/27/19 11:11 PM  Result Value Ref Range Status   SARS Coronavirus 2 NEGATIVE NEGATIVE Final    Comment: (NOTE) If result is NEGATIVE SARS-CoV-2 target nucleic acids are NOT DETECTED. The SARS-CoV-2 RNA is generally detectable in upper and lower  respiratory specimens during the acute phase of infection. The lowest  concentration of SARS-CoV-2 viral copies this assay can detect is 250  copies / mL. A negative result does not preclude SARS-CoV-2 infection  and should not be used as the sole basis for treatment or other  patient management decisions.  A negative result may occur with  improper specimen collection / handling, submission of specimen other  than nasopharyngeal swab, presence of viral mutation(s) within the  areas targeted by this assay, and inadequate number of viral copies  (<250 copies / mL). A negative result must be combined with clinical  observations, patient history, and epidemiological information. If result is POSITIVE SARS-CoV-2 target nucleic acids are DETECTED. The SARS-CoV-2 RNA is generally detectable in upper and lower  respiratory specimens dur ing the acute phase of infection.  Positive  results are indicative of active infection with SARS-CoV-2.   Clinical  correlation with patient history and other diagnostic information is  necessary to determine patient infection status.  Positive results do  not rule out bacterial infection or co-infection with other viruses. If result is PRESUMPTIVE POSTIVE SARS-CoV-2 nucleic acids MAY BE PRESENT.   A presumptive positive result was obtained on the submitted specimen  and confirmed on repeat testing.  While 2019 novel coronavirus  (SARS-CoV-2) nucleic acids may be present in the submitted sample  additional confirmatory testing may be necessary for epidemiological  and / or clinical management purposes  to differentiate between  SARS-CoV-2 and other Sarbecovirus currently known to infect humans.  If clinically indicated additional testing with an alternate test  methodology [phone]) is advised. The SARS-CoV-2 RNA is generally  detectable in upper and lower respiratory sp ecimens during the acute  phase of infection. The expected result is Negative. Fact Sheet for Patients:  StrictlyIdeas.no Fact Sheet for Healthcare Providers: BankingDealers.co.za This test is not yet approved or cleared by the [place] FDA and has been authorized for detection and/or diagnosis of SARS-CoV-2 by FDA under an Emergency Use Authorization (EUA).  This EUA will remain in effect (meaning this test can be used) for the duration of the COVID-19 declaration under Section 564(b)(1) of the Act, 21 U.S.C. section 360bbb-3(b)(1), unless the authorization is terminated or revoked sooner. Performed at [facility], [address] [place], [place] [address]   CULTURE, BLOOD (ROUTINE X 2) w Reflex to ID Panel     Status: None (Preliminary result)   Collection Time: 02/28/19  1:40 AM  Result Value Ref Range Status   Specimen Description BLOOD RIGHT HAND  Final   Special Requests   Final    BOTTLES DRAWN AEROBIC ONLY Blood Culture results may not be optimal  due to an inadequate volume of blood received in culture bottles   Culture   Final    NO GROWTH 1 DAY Performed at [facility], [address], [place], [place] [address]    Report Status PENDING  Incomplete  CULTURE, BLOOD (ROUTINE X 2) w Reflex to ID Panel     Status: None (Preliminary result)   Collection Time: 02/28/19  2:05 AM  Result Value Ref Range Status   Specimen Description BLOOD LEFT HAND  Final   Special Requests   Final    BOTTLES DRAWN AEROBIC AND ANAEROBIC Blood Culture adequate volume   Culture   Final    NO GROWTH 1 DAY Performed at [facility], [address] [place], [place] [address]    Report Status PENDING  Incomplete          IMAGING     I have Independently reviewed images of  CXR  Interpretation:RT sided opacity, pneumonia        Indwelling Urinary Catheter continued, requirement due to   Reason to continue Indwelling Urinary Catheter for strict Intake/Output monitoring for hemodynamic instability         Ventilator continued, requirement due to, resp failure    Ventilator Sedation RASS 0 to -2       ASSESSMENT AND PLAN SYNOPSIS  Severe ACUTE Hypoxic and Hypercapnic Respiratory Failure from COPD exacerbation and pneumonia -continue Mechanical Ventilator support -continue Bronchodilator Therapy -Wean Fio2 and PEEP as tolerated -VAP/VENT bundle implementation -will perform SAT/SBT when respiratory parameters are met  SEVERE COPD EXACERBATION -continue IV steroids as prescribed -continue NEB THERAPY as prescribed -morphine as needed -wean fio2 as needed and tolerated   NEUROLOGY Avoid all sedatives Consider CT head   CARDIAC ICU monitoring  ID-Dx with HCAP Continue IV abx Follow up cultures    GI/Nutrition GI PROPHYLAXIS as indicated DIET-->TF's as tolerated Constipation protocol as indicated  ENDO - ICU hypoglycemic\Hyperglycemia protocol -check FSBS per protocol  ELECTROLYTES  -follow labs as needed -replace as needed -pharmacy consultation and following    DVT/GI PRX ordered TRANSFUSIONS AS NEEDED MONITOR FSBS ASSESS the need for LABS as needed    Critical Care Time devoted to patient care services described in this note is  32 minutes.   Overall, patient is critically ill, prognosis is guarded.    Corrin Parker, M.D.  [facility]  Medical Director [place] Director [facility] Cardio-Pulmonary Department

## 2019-03-02 NOTE — Progress Notes (Signed)
[facility] for Heparin Drip Management  Indication: History of DVT/PE; patient on apixaban as an outpatient   No Known Allergies  Patient Measurements: Height: 5' 2" (157.5 cm) Weight: 200 lb 13.4 oz (91.1 kg) IBW/kg (Calculated) : 50.1 Heparin Dosing Weight: 71 kg.   Vital Signs: Temp: 97.8 F (36.6 C) (04/22 1200) Temp Source: Axillary (04/22 1200) BP: 78/48 (04/22 1400) Pulse Rate: 59 (04/22 1400)  Labs: Recent Labs    02/28/19 0205 02/28/19 0504 03/01/19 1155 03/01/19 2047 03/02/19 0527 03/02/19 0812  HGB 9.8*  --  8.3*  --  7.9*  --   HCT 38.1  --  30.1*  --  28.3*  --   PLT 235  --  174  --  149*  --   HEPARINUNFRC  --   --   --  0.34  --  0.32  CREATININE 0.65 0.72 0.50  --  0.53  --     Estimated Creatinine Clearance: 55 mL/min (by C-G formula based on SCr of 0.53 mg/dL).   Medical History: Past Medical History:  Diagnosis Date  . Acute deep vein thrombosis (DVT) of distal vein of left lower extremity ([place]) 10/03/2015  . Acute pulmonary embolism ([place]) 10/03/2015  . Anticoagulant long-term use   . Arthritis   . Arthritis   . CHF (congestive heart failure) ([place])   . Chronic anticoagulation 10/03/2015  . Collagen vascular disease ([place])   . Colostomy in place [facility]) 10/04/2015  . Deep venous thrombosis ([facility])    right lower extremity  . Dependent edema   . Diverticulosis of intestine with bleeding 04/02/2015  . Glenohumeral arthritis 06/28/2012  . Hammertoe 09/02/2012  . History of bilateral hip replacements 09/02/2012  . History of hysterectomy   . Hypertension   . Hypertension   . Hypokalemia   . Inability to ambulate due to hip 10/12/2015  . Mandibular dysfunction   . Onychomycosis 09/02/2012  . Osteoarthritis   . Polycythemia vera ([place])   . Presence of IVC filter 05/25/2015  . Stroke ([place]) 03/26/2015   cerebellar  . Urinary incontinence in female 09/02/2016    Medications:  Scheduled:  . aspirin  81 mg  Per Tube Daily  . budesonide (PULMICORT) nebulizer solution  0.5 mg Nebulization BID  . chlorhexidine gluconate (MEDLINE KIT)  15 mL Mouth Rinse BID  . insulin aspart  0-9 Units Subcutaneous Q4H  . ipratropium-albuterol  3 mL Nebulization Q6H  . mouth rinse  15 mL Mouth Rinse 10 times per day  . midazolam PF  4 mg Intravenous Once  . multivitamin  15 mL Per Tube Daily  . pantoprazole sodium  40 mg Per Tube Daily  . senna-docusate  1 tablet Oral BID  . sodium chloride flush  3 mL Intravenous Q12H   Infusions:  . sodium chloride Stopped (03/01/19 0949)  . sodium chloride 250 mL (03/02/19 1434)  . ceFEPime (MAXIPIME) IV Stopped (03/02/19 1114)  . dexmedetomidine (PRECEDEX) IV infusion 1 mcg/kg/hr (03/02/19 1243)  . feeding supplement (VITAL HIGH PROTEIN) 50 mL/hr at 03/02/19 0400  . heparin 1,150 Units/hr (03/02/19 1243)  . norepinephrine (LEVOPHED) Adult infusion 2 mcg/min (03/02/19 1542)  . phenylephrine (NEO-SYNEPHRINE) Adult infusion 25 mcg/min (03/02/19 1428)  . vancomycin 1,000 mg (03/02/19 1445)    Assessment: Pharmacy consulted for heparin drip management for 85 yo female with history of recurrent DVTs; last DVT 2016. Patient takes apixban 79m BID as an outpatient. Patient admitted on 4/13 unable to tolerate oral medications  and was started on enoxaparin 43m Q24hr at that time. Patient admitted to ICU on 4/21 with worsening respiratory and subsequently intubated. Order to start heparin infusion given post intubation. Patient last received enoxaparin 431mat 2023 on 4/20.   Goal of Therapy:  Heparin level 0.3-0.7 units/ml Monitor platelets by anticoagulation protocol: Yes   Plan:  Continue heparin 1150 units/hr. Will obtain heparin level with am labs.   Pharmacy will continue to monitor and adjust per consult.    Simpson,Michael L 03/02/2019,3:43 PM

## 2019-03-02 NOTE — Progress Notes (Signed)
[place] [facility] [place] NAME: Yolanda Wells    MR#:  [id number]  DATE OF BIRTH:  10/10/1932  SUBJECTIVE:  CHIEF COMPLAINT:   Chief Complaint  Patient presents with  . Altered Mental Status  . Shortness of Breath  Patient on mechanical ventilator [place] volume 450 Rate 14 PEEP 5 FiO2 30  REVIEW OF SYSTEMS:  Critically ill on ventilator  DRUG ALLERGIES:  No Known Allergies  VITALS:  Blood pressure (!) 107/58, pulse (!) 54, temperature 98.8 F (37.1 C), temperature source Oral, resp. rate 14, height 5\' 2"  (1.575 m), weight 91.1 kg, SpO2 98 %.  PHYSICAL EXAMINATION:   Physical Exam  GENERAL:  85 y.o.-year-old patient lying in the bed, obese. Critically ill EYES: Pupils equal, round, reactive to light and accommodation. No scleral icterus. Extraocular muscles intact.  HEENT: Head atraumatic, normocephalic. Oropharynx and nasopharynx clear.  NECK:  Supple, no jugular venous distention. No thyroid enlargement, no tenderness.  LUNGS: Decrease breath sounds bilateral, shallow, rapid CARDIOVASCULAR: S1, S2 normal. No murmurs, rubs, or gallops.  ABDOMEN: Soft, nontender, nondistended. Bowel sounds present. No organomegaly or mass.  EXTREMITIES: No cyanosis, clubbing. LE edema PSYCHIATRIC: could not be assessed  LABORATORY PANEL:   CBC Recent Labs  Lab 03/02/19 0527  WBC 9.6  HGB 7.9*  HCT 28.3*  PLT 149*   ------------------------------------------------------------------------------------------------------------------ Chemistries  Recent Labs  Lab 03/02/19 0527  NA 141  K 3.2*  CL 104  CO2 31  GLUCOSE 222*  BUN 14  CREATININE 0.53  CALCIUM 7.9*  MG 2.1   ------------------------------------------------------------------------------------------------------------------  Cardiac Enzymes Recent Labs  Lab 02/24/19 0327  TROPONINI 0.08*    ------------------------------------------------------------------------------------------------------------------  RADIOLOGY:  Dg Abd 1 View  Result Date: 03/01/2019 CLINICAL DATA:  Nasogastric tube placement. EXAM: ABDOMEN - 1 VIEW COMPARISON:  Radiographs of November 26, 2018. FINDINGS: The bowel gas pattern is normal. Surgical staples are seen in the left side of the abdomen. IVC filter is noted. Distal tip of nasogastric tube is seen in expected position of distal stomach or proximal duodenum. IMPRESSION: Distal tip of nasogastric tube seen in expected position of distal stomach or proximal duodenum. Electronically Signed   By: Marijo Conception M.D.   On: 03/01/2019 11:10   Dg Chest Port 1 View  Result Date: 03/01/2019 CLINICAL DATA:  Status post intubation. EXAM: PORTABLE CHEST 1 VIEW COMPARISON:  Radiograph of February 27, 2019. FINDINGS: Stable cardiomegaly. Atherosclerosis of thoracic aorta is noted. Endotracheal tube is in grossly good position. No pneumothorax is noted. Minimal left basilar subsegmental atelectasis is noted with small left pleural effusion. Right lung is clear. Severe degenerative changes seen involving the right glenohumeral joint. IMPRESSION: Endotracheal tube in grossly good position. Minimal left basilar subsegmental atelectasis is noted with minimal left pleural effusion. Aortic Atherosclerosis (ICD10-I70.0). Electronically Signed   By: Marijo Conception M.D.   On: 03/01/2019 11:12     ASSESSMENT AND PLAN:   * Acute on chronic hypoxic hypercarbic respiratory failure Pulm HTN, COPD and now PNA On mechanical ventilator Intensivist follow-up Continue vent bundle  * RLL HCAP On  IV vancomycin and cefepime Cx sent and follow-up results   * Acute metabolic encephalopathy secondary to hypercarbia.  Has baseline mild cognitive impairment. On ventilator  * SVT  On metoprolol. Seen by cardiology   *COPD.   Continue nebulization therapy  *DVT  prophylaxis Patient is on Eliquis at home with history of recurrent DVTs.   Palliative care consulted and  following.  All the records are reviewed and case discussed with Care Management/Social Worker Management plans discussed with the patient, family and they are in agreement.  CODE STATUS: FULL CODE  TOTAL TIME TAKING CARE OF THIS PATIENT: 35 minutes.   POSSIBLE D/C IN 1-2 DAYS, DEPENDING ON CLINICAL CONDITION.  Saundra Shelling M.D on 03/02/2019 at 10:34 AM  Between 7am to 6pm - Pager - [phone]  After 6pm go to [email or web address] - password [email or web address] [place] [facility]  Office  [phone]  CC: Primary care physician; [name], MD  Note: This dictation was prepared with Dragon dictation along with smaller phrase technology. Any transcriptional errors that result from this process are unintentional.

## 2019-03-03 DIAGNOSIS — J9601 Acute respiratory failure with hypoxia: Secondary | ICD-10-CM

## 2019-03-03 LAB — BASIC METABOLIC PANEL
Anion gap: 7 (ref 5–15)
BUN: 15 mg/dL (ref 8–23)
CO2: 29 mmol/L (ref 22–32)
Calcium: 8.1 mg/dL — ABNORMAL LOW (ref 8.9–10.3)
Chloride: 107 mmol/L (ref 98–111)
Creatinine, Ser: 0.5 mg/dL (ref 0.44–1.00)
GFR calc Af Amer: >60 mL/min (ref >60)
GFR calc non Af Amer: >60 mL/min (ref >60)
Glucose, Bld: 141 mg/dL — ABNORMAL HIGH (ref 70–99)
Potassium: 4.2 mmol/L (ref 3.5–5.1)
Sodium: 143 mmol/L (ref 135–145)

## 2019-03-03 LAB — GLUCOSE, CAPILLARY
Glucose-Capillary: 124 mg/dL — ABNORMAL HIGH (ref 70–99)
Glucose-Capillary: 148 mg/dL — ABNORMAL HIGH (ref 70–99)
Glucose-Capillary: 156 mg/dL — ABNORMAL HIGH (ref 70–99)
Glucose-Capillary: 144 mg/dL — ABNORMAL HIGH (ref 70–99)
Glucose-Capillary: 152 mg/dL — ABNORMAL HIGH (ref 70–99)

## 2019-03-03 LAB — HEPARIN LEVEL (UNFRACTIONATED)
Heparin Unfractionated: 0.1 IU/mL — ABNORMAL LOW (ref 0.30–0.70)
Heparin Unfractionated: 0.27 IU/mL — ABNORMAL LOW (ref 0.30–0.70)

## 2019-03-03 LAB — CBC
HCT: 29.6 % — ABNORMAL LOW (ref 36.0–46.0)
Hemoglobin: 8.2 g/dL — ABNORMAL LOW (ref 12.0–15.0)
MCH: 23.4 pg — ABNORMAL LOW (ref 26.0–34.0)
MCHC: 27.7 g/dL — ABNORMAL LOW (ref 30.0–36.0)
MCV: 84.3 fL (ref 80.0–100.0)
Platelets: 174 10*3/uL (ref 150–400)
RBC: 3.51 MIL/uL — ABNORMAL LOW (ref 3.87–5.11)
RDW: 30.3 % — ABNORMAL HIGH (ref 11.5–15.5)
WBC: 13.4 10*3/uL — ABNORMAL HIGH (ref 4.0–10.5)
nRBC: 3.4 % — ABNORMAL HIGH (ref 0.0–0.2)

## 2019-03-03 LAB — MAGNESIUM: Magnesium: 1.9 mg/dL (ref 1.7–2.4)

## 2019-03-03 LAB — PHOSPHORUS: Phosphorus: 2.3 mg/dL — ABNORMAL LOW (ref 2.5–4.6)

## 2019-03-03 MED ORDER — AMIODARONE LOAD VIA INFUSION: 150.0000 mg | Freq: Once | INTRAVENOUS | Status: DC

## 2019-03-03 MED ORDER — ADENOSINE 12 MG/4ML IV SOLN
INTRAVENOUS | Status: AC
  Filled 2019-03-03: qty 4

## 2019-03-03 MED ORDER — AMIODARONE IV BOLUS ONLY 150 MG/100ML
150.0000 mg | Freq: Once | INTRAVENOUS | Status: AC
  Administered 2019-03-03: 17:00:00 150 mg via INTRAVENOUS

## 2019-03-03 MED ORDER — SODIUM CHLORIDE 0.9% FLUSH
10.0000 mL | Freq: Two times a day (BID) | INTRAVENOUS | Status: DC
  Administered 2019-03-03 (×2): 30 mL
  Administered 2019-03-04: 10 mL
  Administered 2019-03-04: 10:00:00 30 mL
  Administered 2019-03-05 – 2019-03-07 (×6): 10 mL
  Administered 2019-03-08 – 2019-03-09 (×2): 30 mL
  Administered 2019-03-09 – 2019-03-11 (×4): 10 mL
  Administered 2019-03-11: 20 mL
  Administered 2019-03-12 – 2019-03-15 (×7): 10 mL

## 2019-03-03 MED ORDER — AMIODARONE HCL IN DEXTROSE 360-4.14 MG/200ML-% IV SOLN
30.0000 mg/h | INTRAVENOUS | Status: DC
  Administered 2019-03-03: 22:00:00 30 mg/h via INTRAVENOUS

## 2019-03-03 MED ORDER — AMIODARONE HCL IN DEXTROSE 360-4.14 MG/200ML-% IV SOLN
INTRAVENOUS | Status: AC
  Filled 2019-03-03: qty 200

## 2019-03-03 MED ORDER — AMIODARONE IV BOLUS ONLY 150 MG/100ML
INTRAVENOUS | Status: AC
  Filled 2019-03-03: qty 100

## 2019-03-03 MED ORDER — LORAZEPAM 2 MG/ML IJ SOLN
INTRAMUSCULAR | Status: AC
  Administered 2019-03-03: 02:00:00 2 mg via INTRAMUSCULAR
  Filled 2019-03-03: qty 1

## 2019-03-03 MED ORDER — LORAZEPAM 2 MG/ML IJ SOLN
2.0000 mg | Freq: Once | INTRAMUSCULAR | Status: AC
  Administered 2019-03-03: 2 mg via INTRAMUSCULAR
  Filled 2019-03-03 (×2): qty 1

## 2019-03-03 MED ORDER — LORAZEPAM 2 MG/ML IJ SOLN
2.0000 mg | Freq: Once | INTRAMUSCULAR | Status: AC
  Administered 2019-03-03: 02:00:00 2 mg via INTRAMUSCULAR

## 2019-03-03 MED ORDER — QUETIAPINE FUMARATE 25 MG PO TABS
25.0000 mg | ORAL_TABLET | Freq: Once | ORAL | Status: AC
  Administered 2019-03-03: 10:00:00 25 mg
  Filled 2019-03-03: qty 1

## 2019-03-03 MED ORDER — SODIUM CHLORIDE 0.9% FLUSH: 10.0000 mL | INTRAVENOUS | Status: DC | PRN

## 2019-03-03 MED ORDER — QUETIAPINE FUMARATE 25 MG PO TABS
25.0000 mg | ORAL_TABLET | Freq: Every day | ORAL | Status: DC
  Administered 2019-03-03: 25 mg
  Filled 2019-03-03 (×2): qty 1

## 2019-03-03 MED ORDER — HEPARIN BOLUS VIA INFUSION
1000.0000 [IU] | Freq: Once | INTRAVENOUS | Status: AC
  Administered 2019-03-03: 16:00:00 1000 [IU] via INTRAVENOUS
  Filled 2019-03-03: qty 1000

## 2019-03-03 MED ORDER — ADENOSINE 12 MG/4ML IV SOLN: 6.0000 mg | Freq: Once | INTRAVENOUS | Status: DC

## 2019-03-03 MED ORDER — AMIODARONE HCL IN DEXTROSE 360-4.14 MG/200ML-% IV SOLN
60.0000 mg/h | INTRAVENOUS | Status: AC
  Administered 2019-03-03: 17:00:00 60 mg/h via INTRAVENOUS
  Filled 2019-03-03: qty 200

## 2019-03-03 NOTE — Progress Notes (Signed)
[place] for Heparin Drip Management  Indication: History of DVT/PE; patient on apixaban as an outpatient   No Known Allergies  Patient Measurements: Height: '5\' 2"'$  (157.5 cm) Weight: 201 lb 11.5 oz (91.5 kg) IBW/kg (Calculated) : 50.1 Heparin Dosing Weight: 71 kg.   Vital Signs: Temp: 98.6 F (37 C) (04/23 0400) Temp Source: Oral (04/22 2000) BP: 105/63 (04/23 0600) Pulse Rate: 54 (04/23 0600)  Labs: Recent Labs    03/01/19 1155 03/01/19 2047 03/02/19 0527 03/02/19 0812 03/03/19 0441  HGB 8.3*  --  7.9*  --  8.2*  HCT 30.1*  --  28.3*  --  29.6*  PLT 174  --  149*  --  174  HEPARINUNFRC  --  0.34  --  0.32 0.10*  CREATININE 0.50  --  0.53  --  0.50    Estimated Creatinine Clearance: 55.1 mL/min (by C-G formula based on SCr of 0.5 mg/dL).   Medical History: Past Medical History:  Diagnosis Date  . Acute deep vein thrombosis (DVT) of distal vein of left lower extremity ([place]) 10/03/2015  . Acute pulmonary embolism ([place]) 10/03/2015  . Anticoagulant long-term use   . Arthritis   . Arthritis   . CHF (congestive heart failure) ([place])   . Chronic anticoagulation 10/03/2015  . Collagen vascular disease ([place])   . Colostomy in place [facility]) 10/04/2015  . Deep venous thrombosis (HCC)    right lower extremity  . Dependent edema   . Diverticulosis of intestine with bleeding 04/02/2015  . Glenohumeral arthritis 06/28/2012  . Hammertoe 09/02/2012  . History of bilateral hip replacements 09/02/2012  . History of hysterectomy   . Hypertension   . Hypertension   . Hypokalemia   . Inability to ambulate due to hip 10/12/2015  . Mandibular dysfunction   . Onychomycosis 09/02/2012  . Osteoarthritis   . Polycythemia vera ([place])   . Presence of IVC filter 05/25/2015  . Stroke ([place]) 03/26/2015   cerebellar  . Urinary incontinence in female 09/02/2016    Medications:  Scheduled:  . aspirin  81 mg Per Tube Daily  . budesonide (PULMICORT)  nebulizer solution  0.5 mg Nebulization BID  . chlorhexidine gluconate (MEDLINE KIT)  15 mL Mouth Rinse BID  . insulin aspart  0-9 Units Subcutaneous Q4H  . ipratropium-albuterol  3 mL Nebulization Q6H  . mouth rinse  15 mL Mouth Rinse 10 times per day  . midazolam PF  4 mg Intravenous Once  . multivitamin  15 mL Per Tube Daily  . pantoprazole sodium  40 mg Per Tube Daily  . senna-docusate  1 tablet Oral BID  . sodium chloride flush  10-40 mL Intracatheter Q12H  . sodium chloride flush  3 mL Intravenous Q12H   Infusions:  . sodium chloride Stopped (03/01/19 0949)  . sodium chloride Stopped (03/02/19 1445)  . ceFEPime (MAXIPIME) IV Stopped (03/02/19 2240)  . dexmedetomidine (PRECEDEX) IV infusion 0.6 mcg/kg/hr (03/03/19 0600)  . feeding supplement (VITAL HIGH PROTEIN) 50 mL/hr at 03/03/19 0400  . heparin 1,150 Units/hr (03/03/19 0600)  . norepinephrine (LEVOPHED) Adult infusion 1 mcg/min (03/03/19 0600)  . phenylephrine (NEO-SYNEPHRINE) Adult infusion Stopped (03/02/19 1539)  . vancomycin Stopped (03/02/19 1545)    Assessment: Pharmacy consulted for heparin drip management for 85 yo female with history of recurrent DVTs; last DVT 2016. Patient takes apixban '5mg'$  BID as an outpatient. Patient admitted on 4/13 unable to tolerate oral medications and was started on enoxaparin '40mg'$  Q24hr at that time.  Patient admitted to ICU on 4/21 with worsening respiratory and subsequently intubated. Order to start heparin infusion given post intubation. Patient last received enoxaparin '40mg'$  at 2023 on 4/20.   Goal of Therapy:  Heparin level 0.3-0.7 units/ml Monitor platelets by anticoagulation protocol: Yes   Plan:  04/23 @ 0441 HL 0.10 subtherapeutic; however, drip held for line placement from 0000 til 0300. Will continue rate of 1150 units/hr and will recheck HL @ 1300, H/h low-stable will continue to monitor.  Tobie Lords, PharmD, BCPS Clinical Pharmacist '03/03/2019

## 2019-03-03 NOTE — Progress Notes (Signed)
[place] for Heparin Drip Management  Indication: History of DVT/PE; patient on apixaban as an outpatient   No Known Allergies  Patient Measurements: Height: '5\' 2"'$  (157.5 cm) Weight: 201 lb 11.5 oz (91.5 kg) IBW/kg (Calculated) : 50.1 Heparin Dosing Weight: 71 kg.   Vital Signs: Temp: 98.1 F (36.7 C) (04/23 1600) Temp Source: Axillary (04/23 1600) BP: 175/81 (04/23 1700) Pulse Rate: 69 (04/23 1700)  Labs: Recent Labs    03/01/19 1155  03/02/19 0527 03/02/19 0812 03/03/19 0441 03/03/19 1443  HGB 8.3*  --  7.9*  --  8.2*  --   HCT 30.1*  --  28.3*  --  29.6*  --   PLT 174  --  149*  --  174  --   HEPARINUNFRC  --    < >  --  0.32 0.10* 0.27*  CREATININE 0.50  --  0.53  --  0.50  --    < > = values in this interval not displayed.    Estimated Creatinine Clearance: 55.1 mL/min (by C-G formula based on SCr of 0.5 mg/dL).   Medical History: Past Medical History:  Diagnosis Date  . Acute deep vein thrombosis (DVT) of distal vein of left lower extremity ([place]) 10/03/2015  . Acute pulmonary embolism ([place]) 10/03/2015  . Anticoagulant long-term use   . Arthritis   . Arthritis   . CHF (congestive heart failure) ([place])   . Chronic anticoagulation 10/03/2015  . Collagen vascular disease ([place])   . Colostomy in place [facility]) 10/04/2015  . Deep venous thrombosis (HCC)    right lower extremity  . Dependent edema   . Diverticulosis of intestine with bleeding 04/02/2015  . Glenohumeral arthritis 06/28/2012  . Hammertoe 09/02/2012  . History of bilateral hip replacements 09/02/2012  . History of hysterectomy   . Hypertension   . Hypertension   . Hypokalemia   . Inability to ambulate due to hip 10/12/2015  . Mandibular dysfunction   . Onychomycosis 09/02/2012  . Osteoarthritis   . Polycythemia vera ([place])   . Presence of IVC filter 05/25/2015  . 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 Urinary incontinence in female 09/02/2016     Medications:  Scheduled:  . adenosine (ADENOCARD) IV  6 mg Intravenous Once  . adenosine      . aspirin  81 mg Per Tube Daily  . budesonide (PULMICORT) nebulizer solution  0.5 mg Nebulization BID  . chlorhexidine gluconate (MEDLINE KIT)  15 mL Mouth Rinse BID  . insulin aspart  0-9 Units Subcutaneous Q4H  . ipratropium-albuterol  3 mL Nebulization Q6H  . mouth rinse  15 mL Mouth Rinse 10 times per day  . multivitamin  15 mL Per Tube Daily  . pantoprazole sodium  40 mg Per Tube Daily  . QUEtiapine  25 mg Per Tube QHS  . senna-docusate  1 tablet Oral BID  . sodium chloride flush  10-40 mL Intracatheter Q12H  . sodium chloride flush  3 mL Intravenous Q12H   Infusions:  . sodium chloride Stopped (03/01/19 0949)  . sodium chloride Stopped (03/02/19 1445)  . amiodarone 60 mg/hr (03/03/19 1649)  . amiodarone    . ceFEPime (MAXIPIME) IV Stopped (03/03/19 0947)  . dexmedetomidine (PRECEDEX) IV infusion 1.2 mcg/kg/hr (03/03/19 1702)  . feeding supplement (VITAL HIGH PROTEIN) 1,000 mL (03/03/19 1238)  . heparin 1,300 Units/hr (03/03/19 1702)  . norepinephrine (LEVOPHED) Adult infusion 4 mcg/min (03/03/19 1702)  . phenylephrine (NEO-SYNEPHRINE) Adult infusion Stopped (03/02/19  1539)  . 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 Patient takes apixban '5mg'$  BID as an outpatient. Patient admitted on 4/13 unable to tolerate oral medications and was started on enoxaparin '40mg'$  Q24hr at that time. Patient admitted to ICU on 4/21 with worsening respiratory and subsequently intubated. Order to start heparin infusion given post intubation. Patient last received enoxaparin '40mg'$  at 2023 on 4/20. Heparin infusing at 1150 units/hr.   Goal of Therapy:  Heparin level 0.3-0.7 units/ml Monitor platelets by anticoagulation protocol: Yes   Plan:  Heparin bolus 1000 units x 1 increase rate  to 1300 units/hr. Will obtain heparin level at midnight.   Pharmacy will continue to monitor and adjust per consult.   MLS '03/03/2019

## 2019-03-03 NOTE — Procedures (Signed)
Central Venous Catheter Insertion Procedure Note Airis Barbee [id number] April 24, 1932  Procedure: Insertion of Central Venous Catheter Indications: Assessment of intravascular volume, Drug and/or fluid administration and Frequent blood sampling  Procedure Details Consent: Unable to obtain consent because of emergent medical necessity. Time Out: Verified patient identification, verified procedure, site/side was marked, verified correct patient position, special equipment/implants available, medications/allergies/relevent history reviewed, required imaging and test results available.  Performed  Maximum sterile technique was used including antiseptics, cap, gloves, gown, hand hygiene, mask and sheet. Skin prep: Chlorhexidine; local anesthetic administered A antimicrobial bonded/coated triple lumen catheter was placed in the right femoral vein due to emergent situation using the Seldinger technique.  Evaluation Blood flow good Complications: No apparent complications Patient did tolerate procedure well. Chest X-ray ordered to verify placement.  CXR: Not applicable, placed in Right Femoral Vein.  Procedure was performed using Ultrasound for direct visualization of cannulization of Right Femoral Vein.   Darel Hong, AGACNP-BC [place] [facility] Pager: [phone] Cell: [phone]  Bradly Bienenstock 03/03/2019, 2:51 AM

## 2019-03-03 NOTE — Progress Notes (Signed)
IV team was successful at placing PICC line (2 attempts were made by 2 different IV nurses).  Patient with very limited peripheral IV access, and unsure as to whether existing peripheral IV is good as patient now awake and agitated despite sedation infusing.  Will need to proceed with emergent central line placement for IV access.  Heparin drip was placed on hold at 0000 this morning in anticipation for possible need for central line.    Darel Hong, AGACNP-BC [place] [facility] Pager: [phone] Cell: [phone]

## 2019-03-03 NOTE — Progress Notes (Signed)
[place] [facility] [place] NAME: Yolanda Wells    MR#:  [id number]  DATE OF BIRTH:  02/25/1932  SUBJECTIVE:  CHIEF COMPLAINT:   Chief Complaint  Patient presents with  . Altered Mental Status  . Shortness of Breath  Patient on mechanical ventilator [place] volume 450 Rate 14 PEEP 5 FiO2 30  REVIEW OF SYSTEMS:  Critically ill on ventilator  DRUG ALLERGIES:  No Known Allergies  VITALS:  Blood pressure 105/61, pulse (!) 57, temperature 98.3 F (36.8 C), temperature source Axillary, resp. rate 16, height '5\' 2"'$  (1.575 m), weight 91.5 kg, SpO2 97 %.  PHYSICAL EXAMINATION:   Physical Exam  GENERAL:  85 y.o.-year-old patient lying in the bed, obese. Critically ill EYES: Pupils equal, round, reactive to light and accommodation. No scleral icterus. Extraocular muscles intact.  HEENT: Head atraumatic, normocephalic. Oropharynx and nasopharynx clear.  NECK:  Supple, no jugular venous distention. No thyroid enlargement, no tenderness.  LUNGS: Decrease breath sounds bilateral, shallow, rapid CARDIOVASCULAR: S1, S2 normal. No murmurs, rubs, or gallops.  ABDOMEN: Soft, nontender, nondistended. Bowel sounds present. No organomegaly or mass.  EXTREMITIES: No cyanosis, clubbing. LE edema PSYCHIATRIC: could not be assessed  LABORATORY PANEL:   CBC Recent Labs  Lab 03/03/19 0441  WBC 13.4*  HGB 8.2*  HCT 29.6*  PLT 174   ------------------------------------------------------------------------------------------------------------------ Chemistries  Recent Labs  Lab 03/03/19 0441  NA 143  K 4.2  CL 107  CO2 29  GLUCOSE 141*  BUN 15  CREATININE 0.50  CALCIUM 8.1*  MG 1.9   ------------------------------------------------------------------------------------------------------------------  Cardiac Enzymes No results for input(s): TROPONINI in the last 168  hours. ------------------------------------------------------------------------------------------------------------------  RADIOLOGY:  Dg Abd 1 View  Result Date: 03/01/2019 CLINICAL DATA:  Nasogastric tube placement. EXAM: ABDOMEN - 1 VIEW COMPARISON:  Radiographs of November 26, 2018. FINDINGS: The bowel gas pattern is normal. Surgical staples are seen in the left side of the abdomen. IVC filter is noted. Distal tip of nasogastric tube is seen in expected position of distal stomach or proximal duodenum. IMPRESSION: Distal tip of nasogastric tube seen in expected position of distal stomach or proximal duodenum. Electronically Signed   By: Marijo Conception M.D.   On: 03/01/2019 11:10   Dg Chest Port 1 View  Result Date: 03/01/2019 CLINICAL DATA:  Status post intubation. EXAM: PORTABLE CHEST 1 VIEW COMPARISON:  Radiograph of February 27, 2019. FINDINGS: Stable cardiomegaly. Atherosclerosis of thoracic aorta is noted. Endotracheal tube is in grossly good position. No pneumothorax is noted. Minimal left basilar subsegmental atelectasis is noted with small left pleural effusion. Right lung is clear. Severe degenerative changes seen involving the right glenohumeral joint. IMPRESSION: Endotracheal tube in grossly good position. Minimal left basilar subsegmental atelectasis is noted with minimal left pleural effusion. Aortic Atherosclerosis (ICD10-I70.0). Electronically Signed   By: Marijo Conception M.D.   On: 03/01/2019 11:12   [place] Ekg Site Rite  Result Date: 03/02/2019 If Site Rite image not attached, placement could not be confirmed due to current cardiac rhythm.    ASSESSMENT AND PLAN:   * Acute on chronic hypoxic hypercarbic respiratory failure Pulm HTN, COPD and now PNA On mechanical ventilator Intensivist follow-up Continue vent bundle Weaning trials if parameters met  * RLL HCAP On  IV vancomycin and cefepime Cx sent and follow-up results   * Acute metabolic encephalopathy secondary to  hypercarbia.  Has baseline mild cognitive impairment. On ventilator  * SVT  On metoprolol. Seen by cardiology   *  COPD.   Continue nebulization therapy  *DVT prophylaxis Patient is on Eliquis at home with history of recurrent DVTs.   Palliative care consulted and following.  All the records are reviewed and case discussed with Care Management/Social Worker Management plans discussed with the patient, family and they are in agreement.  CODE STATUS: FULL CODE  TOTAL TIME TAKING CARE OF THIS PATIENT: 35 minutes.   POSSIBLE D/C IN 1-2 DAYS, DEPENDING ON CLINICAL CONDITION.  Saundra Shelling M.D on 03/03/2019 at 9:41 AM  Between 7am to 6pm - Pager - [phone]  After 6pm go to [email or web address] - password [email or web address] [facility]  Office  [phone]  CC: Primary care physician; [name], MD  Note: This dictation was prepared with Dragon dictation along with smaller phrase technology. Any transcriptional errors that result from this process are unintentional.

## 2019-03-04 LAB — CBC WITH DIFFERENTIAL/PLATELET
Basophils Absolute: 0 10*3/uL (ref 0.0–0.1)
Basophils Relative: 0 %
Eosinophils Absolute: 0 10*3/uL (ref 0.0–0.5)
Eosinophils Relative: 0 %
HCT: 24.2 % — ABNORMAL LOW (ref 36.0–46.0)
Hemoglobin: 6.8 g/dL — ABNORMAL LOW (ref 12.0–15.0)
Lymphocytes Relative: 6 %
Lymphs Abs: 0.6 10*3/uL — ABNORMAL LOW (ref 0.7–4.0)
MCH: 23.6 pg — ABNORMAL LOW (ref 26.0–34.0)
MCHC: 28.1 g/dL — ABNORMAL LOW (ref 30.0–36.0)
MCV: 84 fL (ref 80.0–100.0)
Monocytes Absolute: 0.1 10*3/uL (ref 0.1–1.0)
Monocytes Relative: 1 %
Neutro Abs: 8 10*3/uL — ABNORMAL HIGH (ref 1.7–7.7)
Neutrophils Relative %: 86 %
Platelets: 161 10*3/uL (ref 150–400)
RBC: 2.88 MIL/uL — ABNORMAL LOW (ref 3.87–5.11)
RDW: 29.9 % — ABNORMAL HIGH (ref 11.5–15.5)
WBC: 9.2 10*3/uL (ref 4.0–10.5)
nRBC: 4.3 % — ABNORMAL HIGH (ref 0.0–0.2)

## 2019-03-04 LAB — GLUCOSE, CAPILLARY
Glucose-Capillary: 131 mg/dL — ABNORMAL HIGH (ref 70–99)
Glucose-Capillary: 149 mg/dL — ABNORMAL HIGH (ref 70–99)
Glucose-Capillary: 150 mg/dL — ABNORMAL HIGH (ref 70–99)
Glucose-Capillary: 160 mg/dL — ABNORMAL HIGH (ref 70–99)
Glucose-Capillary: 163 mg/dL — ABNORMAL HIGH (ref 70–99)
Glucose-Capillary: 182 mg/dL — ABNORMAL HIGH (ref 70–99)
Glucose-Capillary: 194 mg/dL — ABNORMAL HIGH (ref 70–99)

## 2019-03-04 LAB — BASIC METABOLIC PANEL
Anion gap: 5 (ref 5–15)
BUN: 18 mg/dL (ref 8–23)
CO2: 30 mmol/L (ref 22–32)
Calcium: 7.7 mg/dL — ABNORMAL LOW (ref 8.9–10.3)
Chloride: 105 mmol/L (ref 98–111)
Creatinine, Ser: 0.39 mg/dL — ABNORMAL LOW (ref 0.44–1.00)
GFR calc Af Amer: >60 mL/min (ref >60)
GFR calc non Af Amer: >60 mL/min (ref >60)
Glucose, Bld: 176 mg/dL — ABNORMAL HIGH (ref 70–99)
Potassium: 4 mmol/L (ref 3.5–5.1)
Sodium: 140 mmol/L (ref 135–145)

## 2019-03-04 LAB — BLOOD GAS, ARTERIAL
Acid-Base Excess: 6.8 mmol/L — ABNORMAL HIGH (ref 0.0–2.0)
Allens test (pass/fail): POSITIVE — AB
Bicarbonate: 31.3 mmol/L — ABNORMAL HIGH (ref 20.0–28.0)
FIO2: 0.6
MECHVT: 450 mL
Mechanical Rate: 14
O2 Saturation: 99.8 %
PEEP: 5 cmH2O
Patient temperature: 37
RATE: 14 resp/min
pCO2 arterial: 43 mmHg (ref 32.0–48.0)
pH, Arterial: 7.47 — ABNORMAL HIGH (ref 7.350–7.450)
pO2, Arterial: 208 mmHg — ABNORMAL HIGH (ref 83.0–108.0)

## 2019-03-04 LAB — HEPARIN LEVEL (UNFRACTIONATED)
Heparin Unfractionated: 0.32 IU/mL (ref 0.30–0.70)
Heparin Unfractionated: 0.43 IU/mL (ref 0.30–0.70)

## 2019-03-04 LAB — PHOSPHORUS: Phosphorus: 3.3 mg/dL (ref 2.5–4.6)

## 2019-03-04 LAB — MAGNESIUM: Magnesium: 1.8 mg/dL (ref 1.7–2.4)

## 2019-03-04 MED ORDER — LORAZEPAM 2 MG/ML IJ SOLN
2.0000 mg | INTRAMUSCULAR | Status: DC | PRN
  Administered 2019-03-04 – 2019-03-08 (×13): 2 mg via INTRAVENOUS
  Filled 2019-03-04 (×15): qty 1

## 2019-03-04 MED ORDER — MIDAZOLAM HCL 2 MG/2ML IJ SOLN
INTRAMUSCULAR | Status: AC
  Filled 2019-03-04: qty 2

## 2019-03-04 MED ORDER — MAGNESIUM SULFATE 2 GM/50ML IV SOLN
2.0000 g | Freq: Once | INTRAVENOUS | Status: AC
  Administered 2019-03-04: 08:00:00 2 g via INTRAVENOUS
  Filled 2019-03-04: qty 50

## 2019-03-04 MED ORDER — QUETIAPINE FUMARATE 25 MG PO TABS
25.0000 mg | ORAL_TABLET | Freq: Two times a day (BID) | ORAL | Status: DC
  Administered 2019-03-04 – 2019-03-08 (×9): 25 mg
  Filled 2019-03-04 (×8): qty 1

## 2019-03-04 MED ORDER — MIDAZOLAM HCL 2 MG/2ML IJ SOLN
2.0000 mg | Freq: Once | INTRAMUSCULAR | Status: AC
  Administered 2019-03-04: 15:00:00 2 mg via INTRAVENOUS

## 2019-03-04 MED ORDER — THROMBIN 5000 UNITS EX SOLR
5000.0000 [IU] | Freq: Once | CUTANEOUS | Status: AC
  Administered 2019-03-05: 5000 [IU] via TOPICAL
  Filled 2019-03-04: qty 5000

## 2019-03-04 NOTE — Progress Notes (Addendum)
[place] for Heparin Drip Management  Indication: History of DVT/PE; patient on apixaban as an outpatient   No Known Allergies  Patient Measurements: Height: '5\' 2"'$  (157.5 cm) Weight: 201 lb 11.5 oz (91.5 kg) IBW/kg (Calculated) : 50.1 Heparin Dosing Weight: 71 kg.   Vital Signs: Temp: 97.9 F (36.6 C) (04/24 0000) Temp Source: Oral (04/24 0000) BP: 116/70 (04/24 0000) Pulse Rate: 54 (04/24 0000)  Labs: Recent Labs    03/01/19 1155  03/02/19 0527  03/03/19 0441 03/03/19 1443 03/04/19 0004  HGB 8.3*  --  7.9*  --  8.2*  --   --   HCT 30.1*  --  28.3*  --  29.6*  --   --   PLT 174  --  149*  --  174  --   --   HEPARINUNFRC  --    < >  --    < > 0.10* 0.27* 0.32  CREATININE 0.50  --  0.53  --  0.50  --   --    < > = values in this interval not displayed.    Estimated Creatinine Clearance: 55.1 mL/min (by C-G formula based on SCr of 0.5 mg/dL).   Medical History: Past Medical History:  Diagnosis Date  . Acute deep vein thrombosis (DVT) of distal vein of left lower extremity ([place]) 10/03/2015  . Acute pulmonary embolism ([place]) 10/03/2015  . Anticoagulant long-term use   . Arthritis   . Arthritis   . CHF (congestive heart failure) ([place])   . Chronic anticoagulation 10/03/2015  . Collagen vascular disease ([place])   . Colostomy in place [facility]) 10/04/2015  . Deep venous thrombosis (HCC)    right lower extremity  . Dependent edema   . Diverticulosis of intestine with bleeding 04/02/2015  . Glenohumeral arthritis 06/28/2012  . Hammertoe 09/02/2012  . History of bilateral hip replacements 09/02/2012  . History of hysterectomy   . Hypertension   . Hypertension   . Hypokalemia   . Inability to ambulate due to hip 10/12/2015  . Mandibular dysfunction   . Onychomycosis 09/02/2012  . Osteoarthritis   . Polycythemia vera ([place])   . Presence of IVC filter 05/25/2015  . 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 Urinary incontinence in female  09/02/2016    Medications:  Scheduled:  . adenosine (ADENOCARD) IV  6 mg Intravenous Once  . adenosine      . aspirin  81 mg Per Tube Daily  . budesonide (PULMICORT) nebulizer solution  0.5 mg Nebulization BID  . chlorhexidine gluconate (MEDLINE KIT)  15 mL Mouth Rinse BID  . insulin aspart  0-9 Units Subcutaneous Q4H  . ipratropium-albuterol  3 mL Nebulization Q6H  . mouth rinse  15 mL Mouth Rinse 10 times per day  . multivitamin  15 mL Per Tube Daily  . pantoprazole sodium  40 mg Per Tube Daily  . QUEtiapine  25 mg Per Tube QHS  . senna-docusate  1 tablet Oral BID  . sodium chloride flush  10-40 mL Intracatheter Q12H  . sodium chloride flush  3 mL Intravenous Q12H   Infusions:  . sodium chloride Stopped (03/01/19 0949)  . sodium chloride Stopped (03/02/19 1445)  . amiodarone 30 mg/hr (03/04/19 0000)  . ceFEPime (MAXIPIME) IV 200 mL/hr at 03/04/19 0000  . dexmedetomidine (PRECEDEX) IV infusion 1.2 mcg/kg/hr (03/04/19 0000)  . feeding supplement (VITAL HIGH PROTEIN) 50 mL/hr at 03/04/19 0000  . heparin 1,300 Units/hr (03/04/19 0000)  . norepinephrine (  LEVOPHED) Adult infusion 1 mcg/min (03/04/19 0000)  . phenylephrine (NEO-SYNEPHRINE) Adult infusion Stopped (03/02/19 1539)  . 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 Patient takes apixban '5mg'$  BID as an outpatient. Patient admitted on 4/13 unable to tolerate oral medications and was started on enoxaparin '40mg'$  Q24hr at that time. Patient admitted to ICU on 4/21 with worsening respiratory and subsequently intubated. Order to start heparin infusion given post intubation. Patient last received enoxaparin '40mg'$  at 2023 on 4/20. Heparin infusing at 1150 units/hr.   Goal of Therapy:  Heparin level 0.3-0.7 units/ml Monitor platelets by anticoagulation protocol: Yes   Plan:  04/24 @ 0000 HL 0.32 therapeutic. Will  continue current rate and will recheck @ 0800.  Tobie Lords, PharmD, BCPS Clinical Pharmacist 03/04/2019

## 2019-03-04 NOTE — Progress Notes (Addendum)
Daily Progress Note   Patient Name: Yolanda Wells       Date: 03/04/2019 DOB: 08/31/1932  Age: 85 y.o. MRN#: [id number] Attending Physician: Saundra Shelling, MD Primary Care Physician: [name], MD Admit Date: 02/13/2019  Reason for Consultation/Follow-up: Establishing goals of care  Subjective: Patient resting in bed on ventilator with mittens in place. Per CCM, patient became tachypnic with pressure support. CCM plans to discuss trach/PEG with family, and would like to speak with them prior to a discussion with palliative. Plans for CCM to speak with family today, and palliative will reach out to family Monday to offer support and further follow up after they have had a couple of days to discuss and consider options.    Length of Stay: 11  Current Medications: Scheduled Meds:  . adenosine (ADENOCARD) IV  6 mg Intravenous Once  . aspirin  81 mg Per Tube Daily  . budesonide (PULMICORT) nebulizer solution  0.5 mg Nebulization BID  . chlorhexidine gluconate (MEDLINE KIT)  15 mL Mouth Rinse BID  . insulin aspart  0-9 Units Subcutaneous Q4H  . ipratropium-albuterol  3 mL Nebulization Q6H  . mouth rinse  15 mL Mouth Rinse 10 times per day  . multivitamin  15 mL Per Tube Daily  . pantoprazole sodium  40 mg Per Tube Daily  . QUEtiapine  25 mg Per Tube QHS  . senna-docusate  1 tablet Oral BID  . sodium chloride flush  10-40 mL Intracatheter Q12H  . sodium chloride flush  3 mL Intravenous Q12H    Continuous Infusions: . sodium chloride Stopped (03/01/19 0949)  . sodium chloride Stopped (03/02/19 1445)  . ceFEPime (MAXIPIME) IV Stopped (03/04/19 0001)  . dexmedetomidine (PRECEDEX) IV infusion Stopped (03/04/19 0816)  . feeding supplement (VITAL HIGH PROTEIN) 50 mL/hr at 03/04/19 0400   . heparin 1,300 Units/hr (03/04/19 0834)  . norepinephrine (LEVOPHED) Adult infusion 2 mcg/min (03/04/19 0834)  . phenylephrine (NEO-SYNEPHRINE) Adult infusion Stopped (03/02/19 1539)  . vancomycin Stopped (03/03/19 1322)    PRN Meds: sodium chloride, acetaminophen **OR** acetaminophen, fentaNYL (SUBLIMAZE) injection, ipratropium-albuterol, lip balm, LORazepam, metoprolol tartrate, [DISCONTINUED] ondansetron **OR** ondansetron (ZOFRAN) IV, polyethylene glycol, sodium chloride flush  Physical Exam Pulmonary:     Comments: Vent support Skin:    General: Skin is warm and dry.  Vital Signs: BP 105/65   Pulse 60   Temp 98.6 F (37 C) (Axillary)   Resp 19   Ht '5\' 2"'$  (1.575 m)   Wt 93.8 kg   SpO2 98%   BMI 37.82 kg/m  SpO2: SpO2: 98 % O2 Device: O2 Device: Ventilator O2 Flow Rate: O2 Flow Rate (L/min): 2 L/min  Intake/output summary:   Intake/Output Summary (Last 24 hours) at 03/04/2019 1027 Last data filed at 03/04/2019 1026 Gross per 24 hour  Intake 2943.88 ml  Output 610 ml  Net 2333.88 ml   LBM: Last BM Date: 03/04/19 Baseline Weight: Weight: 90 kg Most recent weight: Weight: 93.8 kg       Palliative Assessment/Data:    Flowsheet Rows     Most Recent Value  Intake Tab  Referral Department  Hospitalist  Unit at Time of Referral  Med/Surg Unit  Palliative Care Primary Diagnosis  Pulmonary  Date Notified  03/05/2019  Palliative Care Type  Return patient Palliative Care  Reason for referral  Clarify Goals of Care  Date of Admission  02/27/2019  Date first seen by Palliative Care  02/22/19  # of days Palliative referral response time  1 Day(s)  # of days IP prior to Palliative referral  0  Clinical Assessment  Palliative Performance Scale Score  30%  Pain Max last 24 hours  Not able to report  Pain Min Last 24 hours  Not able to report  Dyspnea Max Last 24 Hours  Not able to report  Dyspnea Min Last 24 hours  Not able to report  Psychosocial &  Spiritual Assessment  Palliative Care Outcomes      Patient Active Problem List   Diagnosis Date Noted  . Acute respiratory failure with hypoxia and hypercapnia (HCC)   . Acute on chronic respiratory failure ([place]) 02/24/2019  . Decreased appetite 02/02/2019  . Poor appetite 12/24/2018  . Unsteady 12/24/2018  . Palliative care encounter 12/24/2018  . SOB (shortness of breath) 10/28/2018  . Shortness of breath 09/27/2018  . COPD exacerbation ([place]) 09/22/2018  . Sepsis ([place]) 09/22/2018  . Lower GI bleed 09/09/2018  . AMS (altered mental status) 08/30/2018  . Pressure injury of skin 08/13/2018  . B12 deficiency 08/04/2018  . Anemia 06/07/2018  . Secondary myelofibrosis ([place]) 02/17/2018  . Goals of care, counseling/discussion 12/23/2017  . Palliative care by specialist   . DNR (do not resuscitate) discussion   . Weakness generalized   . Chronic diastolic CHF (congestive heart failure) ([place]) 07/28/2017  . Vascular dementia without behavioral disturbance ([place]) 07/08/2017  . Influenza with respiratory manifestation 12/01/2016  . Respiratory distress 11/23/2016  . Urinary incontinence in female 09/02/2016  . Vision loss of right eye 08/26/2016  . Blindness of right eye 08/26/2016  . Inability to ambulate due to hip 10/12/2015  . Colostomy in place [facility]) 10/04/2015  . Long term current use of anticoagulant therapy 10/03/2015  . Leukocytosis 09/25/2015  . CAP (community acquired pneumonia) 09/24/2015  . COPD (chronic obstructive pulmonary disease) ([place]) 09/24/2015  . UTI (urinary tract infection) 07/16/2015  . Abdominal wall cellulitis 07/15/2015  . DVT (deep venous thrombosis) ([place]) 07/15/2015  . HTN (hypertension) 07/15/2015  . Polycythemia vera ([place]) 07/15/2015  . Arthritis 07/15/2015  . Pulmonary emboli ([place]) 05/25/2015  . Diverticulosis of colon with hemorrhage 04/02/2015  . Diverticulosis of intestine with bleeding 04/02/2015  . Fall 01/10/2015  . Osteoarthritis  01/10/2015  . Hammertoe 09/02/2012  . S/P transmetatarsal amputation of foot ([place]) 09/02/2012  .  Onychomycosis 09/02/2012  . Other specified dermatoses 09/02/2012  . S/P hip replacement 08/23/2012  . Glenohumeral arthritis 06/28/2012    Palliative Care Assessment & Plan    Recommendations/Plan:  Will follow up with family Monday after family has had a chance to consider pending conversation with CCM today.     Code Status:    Code Status Orders  (From admission, onward)         Start     Ordered   02/26/2019 1349  Full code  Continuous     02/20/2019 1349        Code Status History    Date Active Date Inactive Code Status Order ID Comments User Context   02/18/2019 1813 02/20/2019 2309 Full Code [id number]  Otila Back, MD Inpatient   11/08/2018 1532 11/10/2018 2032 DNR [id number]  Dustin Flock, MD Inpatient   10/28/2018 1334 10/31/2018 1621 DNR [id number]  Dustin Flock, MD Inpatient   09/27/2018 1901 09/30/2018 1545 DNR [id number]  Loletha Grayer, MD ED   09/22/2018 0133 09/25/2018 2201 Full Code [id number]  Amelia Jo, MD Inpatient   09/09/2018 1240 09/12/2018 2008 Full Code [id number]  Loletha Grayer, MD ED   08/30/2018 2018 09/01/2018 2004 DNR [id number]  Nicholes Mango, MD ED   08/30/2018 1903 08/30/2018 2018 Full Code [id number]  Nicholes Mango, MD ED   08/13/2018 1038 08/15/2018 1836 Full Code [id number]  Loletha Grayer, MD Inpatient   08/12/2018 2001 08/13/2018 1038 DNR [id number]  Gorden Harms, MD ED   08/12/2018 2001 08/12/2018 2001 Full Code [id number]  Salary, Avel Peace, MD ED   06/15/2018 1635 06/18/2018 2009 Full Code [id number]  [name], MD Inpatient   02/24/2018 0347 02/27/2018 0027 Full Code [id number]  Amelia Jo, MD Inpatient   10/15/2017 1141 10/16/2017 2204 Full Code [id number]  Knox Royalty, NP Inpatient   10/13/2017 2246 10/15/2017 1141 DNR [id number]  Lance Coon, MD ED   10/06/2017 1454 10/09/2017 2058 DNR [id number]  Nicholes Mango, MD Inpatient    10/06/2017 1111 10/06/2017 1453 Full Code [id number]  Nicholes Mango, MD Inpatient   05/27/2017 1954 05/28/2017 2013 Full Code [id number]  Vaughan Basta, MD Inpatient   05/23/2017 2322 05/26/2017 1614 Full Code [id number]  Lance Coon, MD Inpatient   04/10/2017 0325 04/11/2017 1916 Full Code [id number]  Harrie Foreman, MD Inpatient   11/23/2016 0629 12/01/2016 1935 Full Code [id number]  Harrie Foreman, MD Inpatient   08/26/2016 0445 08/26/2016 1923 Full Code [id number]  Saundra Shelling, MD Inpatient   09/22/2015 1755 09/25/2015 1844 Full Code [id number]  Demetrios Loll, MD ED   09/15/2015 0341 09/17/2015 1741 Full Code [id number]  Harrie Foreman, MD Inpatient   07/16/2015 0151 07/20/2015 2334 Full Code [id number]  Lance Coon, MD Inpatient   05/25/2015 0834 05/29/2015 1616 Full Code [id number]  Harrie Foreman, MD Inpatient   04/02/2015 0606 04/08/2015 1651 Full Code [id number]  Harrie Foreman, MD Inpatient       Prognosis:  Poor longterm.  Discharge Planning:  To Be Determined  Care plan was discussed with CCM MD  Thank you for allowing the Palliative Medicine Team to assist in the care of this patient.   Total Time 15 min Prolonged Time Billed  no      Greater than 50%  of this time was spent counseling and coordinating care related to the above assessment and plan.  Asencion Gowda, NP  Please contact Palliative Medicine Team phone at [phone] for questions and concerns.

## 2019-03-04 NOTE — Progress Notes (Signed)
[facility] for Heparin Drip Management  Indication: History of DVT/PE; patient on apixaban as an outpatient   No Known Allergies  Patient Measurements: Height: _0  (157.5 cm) Weight: 206 lb 12.7 oz (93.8 kg) IBW/kg (Calculated) : 50.1 Heparin Dosing Weight: 71 kg.   Vital Signs: Temp: 98.6 F (37 C) (04/24 0800) Temp Source: Axillary (04/24 0800) BP: 104/60 (04/24 1100) Pulse Rate: 61 (04/24 1100)  Labs: Recent Labs    03/02/19 0527  03/03/19 0441 03/03/19 1443 03/04/19 0004 03/04/19 0415 03/04/19 0801  HGB 7.9*  --  8.2*  --   --  6.8*  --   HCT 28.3*  --  29.6*  --   --  24.2*  --   PLT 149*  --  174  --   --  161  --   HEPARINUNFRC  --    < > 0.10* 0.27* 0.32  --  0.43  CREATININE 0.53  --  0.50  --   --  0.39*  --    < > = values in this interval not displayed.    Estimated Creatinine Clearance: 55.9 mL/min (A) (by C-G formula based on SCr of 0.39 mg/dL (L)).   Medical History: Past Medical History:  Diagnosis Date  . Acute deep vein thrombosis (DVT) of distal vein of left lower extremity ([place]) 10/03/2015  . Acute pulmonary embolism ([place]) 10/03/2015  . Anticoagulant long-term use   . Arthritis   . Arthritis   . CHF (congestive heart failure) ([place])   . Chronic anticoagulation 10/03/2015  . Collagen vascular disease ([place])   . Colostomy in place [facility]) 10/04/2015  . Deep venous thrombosis (HCC)    right lower extremity  . Dependent edema   . Diverticulosis of intestine with bleeding 04/02/2015  . Glenohumeral arthritis 06/28/2012  . Hammertoe 09/02/2012  . History of bilateral hip replacements 09/02/2012  . History of hysterectomy   . Hypertension   . Hypertension   . Hypokalemia   . Inability to ambulate due to hip 10/12/2015  . Mandibular dysfunction   . Onychomycosis 09/02/2012  . Osteoarthritis   . Polycythemia vera ([place])   . Presence of IVC filter 05/25/2015  . 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  Urinary incontinence in female 09/02/2016    Medications:  Scheduled:  . adenosine (ADENOCARD) IV  6 mg Intravenous Once  . aspirin  81 mg Per Tube Daily  . budesonide (PULMICORT) nebulizer solution  0.5 mg Nebulization BID  . chlorhexidine gluconate (MEDLINE KIT)  15 mL Mouth Rinse BID  . insulin aspart  0-9 Units Subcutaneous Q4H  . ipratropium-albuterol  3 mL Nebulization Q6H  . mouth rinse  15 mL Mouth Rinse 10 times per day  . multivitamin  15 mL Per Tube Daily  . pantoprazole sodium  40 mg Per Tube Daily  . QUEtiapine  25 mg Per Tube QHS  . senna-docusate  1 tablet Oral BID  . sodium chloride flush  10-40 mL Intracatheter Q12H  . sodium chloride flush  3 mL Intravenous Q12H   Infusions:  . sodium chloride Stopped (03/01/19 0949)  . sodium chloride Stopped (03/02/19 1445)  . ceFEPime (MAXIPIME) IV 2 g (03/04/19 1035)  . dexmedetomidine (PRECEDEX) IV infusion Stopped (03/04/19 0816)  . feeding supplement (VITAL HIGH PROTEIN) 1,000 mL (03/04/19 1038)  . heparin 1,300 Units/hr (03/04/19 0834)  . norepinephrine (LEVOPHED) Adult infusion 2 mcg/min (03/04/19 0834)  . phenylephrine (NEO-SYNEPHRINE) Adult infusion Stopped (03/02/19 1539)  .  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 Patient takes apixban 13m BID as an outpatient. Patient admitted on 4/13 unable to tolerate oral medications and was started on enoxaparin 46mQ24hr at that time. Patient admitted to ICU on 4/21 with worsening respiratory and subsequently intubated. Order to start heparin infusion given post intubation. Patient last received enoxaparin 4068mt 2023 on 4/20. Heparin infusing at 1300 units/hr.   Goal of Therapy:  Heparin level 0.3-0.7 units/ml Monitor platelets by anticoagulation protocol: Yes   Plan:  Continue heparin infusion at 1300 units/hr. Will obtain follow up anti-Xa level with  am labs.   Pharmacy will continue to monitor and adjust per consult.   MLS 03/04/2019

## 2019-03-04 NOTE — Progress Notes (Signed)
Pharmacy Electrolyte Monitoring Consult:  Pharmacy consulted to assist in monitoring and replacing electrolytes in this 85 y.o. female admitted on 02/09/2019 with Altered Mental Status and Shortness of Breath   Labs:  Sodium (mmol/L)  Date Value  03/04/2019 140  04/10/2015 137  07/08/2014 144   Potassium (mmol/L)  Date Value  03/04/2019 4.0  07/08/2014 3.7   Magnesium (mg/dL)  Date Value  03/04/2019 1.8  05/08/2014 2.6 (H)   Phosphorus (mg/dL)  Date Value  03/04/2019 3.3   Calcium (mg/dL)  Date Value  03/04/2019 7.7 (L)   Calcium, Total (mg/dL)  Date Value  07/08/2014 8.4 (L)   Albumin (g/dL)  Date Value  03/07/2019 3.1 (L)  07/08/2014 2.9 (L)   Corrected Calcium: 8.4  Assessment/Plan: Magnesium 2g IV x 1.   Will replace for goal potassium ~ 4 and goal magnesium ~ 2.   Will obtain electrolytes with am labs.   Pharmacy will continue to monitor and adjust per consult.   Nathasha Fiorillo L 03/04/2019 4:01 PM

## 2019-03-04 NOTE — Progress Notes (Signed)
[facility] NAME: Yolanda Wells    MR#:  [id number]  DATE OF BIRTH:  1932/04/13  SUBJECTIVE:  CHIEF COMPLAINT:   Chief Complaint  Patient presents with  . Altered Mental Status  . Shortness of Breath  Patient continues to be on mechanical ventilator [place] volume 450 Rate 14 PEEP 5 FiO2 30  REVIEW OF SYSTEMS:  Critically ill on ventilator  DRUG ALLERGIES:  No Known Allergies  VITALS:  Blood pressure (!) 90/57, pulse 63, temperature 98.6 F (37 C), temperature source Axillary, resp. rate (!) 28, height _0  (1.575 m), weight 93.8 kg, SpO2 100 %.  PHYSICAL EXAMINATION:   Physical Exam  GENERAL:  85 y.o.-year-old patient lying in the bed, obese. Critically ill EYES: Pupils equal, round, reactive to light and accommodation. No scleral icterus. Extraocular muscles intact.  HEENT: Head atraumatic, normocephalic. Oropharynx and nasopharynx clear.  NECK:  Supple, no jugular venous distention. No thyroid enlargement, no tenderness.  LUNGS: Decrease breath sounds bilateral, shallow, rapid CARDIOVASCULAR: S1, S2 normal. No murmurs, rubs, or gallops.  ABDOMEN: Soft, nontender, nondistended. Bowel sounds present. No organomegaly or mass.  EXTREMITIES: No cyanosis, clubbing. LE edema PSYCHIATRIC: could not be assessed  LABORATORY PANEL:   CBC Recent Labs  Lab 03/04/19 0415  WBC 9.2  HGB 6.8*  HCT 24.2*  PLT 161   ------------------------------------------------------------------------------------------------------------------ Chemistries  Recent Labs  Lab 03/04/19 0415  NA 140  K 4.0  CL 105  CO2 30  GLUCOSE 176*  BUN 18  CREATININE 0.39*  CALCIUM 7.7*  MG 1.8   ------------------------------------------------------------------------------------------------------------------  Cardiac Enzymes No results for input(s): TROPONINI in the last 168  hours. ------------------------------------------------------------------------------------------------------------------  RADIOLOGY:  [place] Ekg Site Rite  Result Date: 03/02/2019 If [facility] image not attached, placement could not be confirmed due to current cardiac rhythm.    ASSESSMENT AND PLAN:   * Acute on chronic hypoxic hypercarbic respiratory failure Pulm HTN, COPD and now PNA On mechanical ventilator Intensivist follow-up Continue vent bundle Weaning trials if parameters met  * RLL HCAP On  IV vancomycin and cefepime Duration of antibiotics as per ICU attending Cx sent and follow-up results  *Hypotension Support blood pressure with IV Neo-Synephrine   * Acute metabolic encephalopathy secondary to hypercarbia.  Has baseline mild cognitive impairment. On ventilator  * SVT  On metoprolol. Seen by cardiology   *COPD exacerbation improving slowly Continue nebulization therapy IV steroid therapy  *DVT prophylaxis On heparin drip for anticoagulation   Palliative care consulted and following.  All the records are reviewed and case discussed with Care Management/Social Worker Management plans discussed with the patient, family and they are in agreement.  CODE STATUS: FULL CODE  TOTAL TIME TAKING CARE OF THIS PATIENT: 35 minutes.   POSSIBLE D/C IN 1-2 DAYS, DEPENDING ON CLINICAL CONDITION.  Saundra Shelling M.D on 03/04/2019 at 10:09 AM  Between 7am to 6pm - Pager - [phone]  After 6pm go to [email or web address] - password [email or web address] [facility]  Office  [phone]  CC: Primary care physician; [name], MD  Note: This dictation was prepared with Dragon dictation along with smaller phrase technology. Any transcriptional errors that result from this process are unintentional.

## 2019-03-04 NOTE — Consult Note (Signed)
Pharmacy Antibiotic Note  Yolanda Wells is a 85 y.o. female admitted on 02/20/2019 with pneumonia.  Pharmacy has been consulted for cefepime and vancomycin dosing.  Plan: MRSA PCR positive. Patient's CXR improving and patient remains afebrile, Tmax 99.14F. Procalcitonin minimally elevated.   Cefepime 2g IV Q12hr for 7 days.   Vancomycin 1000mg  IV Q24hr for 7 days of vancomycin therapy.   Will monitor renal function and obtain levels as warranted.   Height: 5\' 2"  (157.5 cm) Weight: 206 lb 12.7 oz (93.8 kg) IBW/kg (Calculated) : 50.1  Temp (24hrs), Avg:98.2 F (36.8 C), Min:97.8 F (36.6 C), Max:98.8 F (37.1 C)  Recent Labs  Lab 02/28/19 0205 02/28/19 0504 03/01/19 1155 03/02/19 0527 03/03/19 0441 03/04/19 0415  WBC 16.3*  --  9.8 9.6 13.4* 9.2  CREATININE 0.65 0.72 0.50 0.53 0.50 0.39*  LATICACIDVEN 1.7  --   --   --   --   --     Estimated Creatinine Clearance: 55.9 mL/min (A) (by C-G formula based on SCr of 0.39 mg/dL (L)).    No Known Allergies  Antimicrobials this admission: Ceftriaxone 4/15 >> 4/18  Cefepime 4/20 >> 4/26  Vancomycin 4/20 >> 4/26.   Dose adjustments this admission: None  Microbiology results: 4/20 BCx: no growth x 4 days  4/19 SARS Coronavirus 2: negative  4/14 BCx: no growth  4/14 UCx: contaminant  4/13 MRSA PCR: positive   Thank you for allowing pharmacy to be a part of this patient's care.  Simpson,Michael L 03/04/2019 12:41 PM

## 2019-03-05 LAB — CBC
HCT: 22.6 % — ABNORMAL LOW (ref 36.0–46.0)
HCT: 22.7 % — ABNORMAL LOW (ref 36.0–46.0)
HCT: 27.1 % — ABNORMAL LOW (ref 36.0–46.0)
Hemoglobin: 6.3 g/dL — ABNORMAL LOW (ref 12.0–15.0)
Hemoglobin: 6.4 g/dL — ABNORMAL LOW (ref 12.0–15.0)
Hemoglobin: 7.7 g/dL — ABNORMAL LOW (ref 12.0–15.0)
MCH: 23.4 pg — ABNORMAL LOW (ref 26.0–34.0)
MCH: 23.5 pg — ABNORMAL LOW (ref 26.0–34.0)
MCH: 24.4 pg — ABNORMAL LOW (ref 26.0–34.0)
MCHC: 27.9 g/dL — ABNORMAL LOW (ref 30.0–36.0)
MCHC: 28.2 g/dL — ABNORMAL LOW (ref 30.0–36.0)
MCHC: 28.4 g/dL — ABNORMAL LOW (ref 30.0–36.0)
MCV: 83.5 fL (ref 80.0–100.0)
MCV: 84 fL (ref 80.0–100.0)
MCV: 85.8 fL (ref 80.0–100.0)
Platelets: 156 10*3/uL (ref 150–400)
Platelets: 156 10*3/uL (ref 150–400)
Platelets: 165 10*3/uL (ref 150–400)
RBC: 2.69 MIL/uL — ABNORMAL LOW (ref 3.87–5.11)
RBC: 2.72 MIL/uL — ABNORMAL LOW (ref 3.87–5.11)
RBC: 3.16 MIL/uL — ABNORMAL LOW (ref 3.87–5.11)
RDW: 28.1 % — ABNORMAL HIGH (ref 11.5–15.5)
RDW: 30.2 % — ABNORMAL HIGH (ref 11.5–15.5)
RDW: 30.3 % — ABNORMAL HIGH (ref 11.5–15.5)
WBC: 10.8 10*3/uL — ABNORMAL HIGH (ref 4.0–10.5)
WBC: 9.4 10*3/uL (ref 4.0–10.5)
WBC: 9.4 10*3/uL (ref 4.0–10.5)
nRBC: 3 % — ABNORMAL HIGH (ref 0.0–0.2)
nRBC: 3.3 % — ABNORMAL HIGH (ref 0.0–0.2)
nRBC: 3.4 % — ABNORMAL HIGH (ref 0.0–0.2)

## 2019-03-05 LAB — HEPARIN LEVEL (UNFRACTIONATED)
Heparin Unfractionated: 0.21 IU/mL — ABNORMAL LOW (ref 0.30–0.70)
Heparin Unfractionated: 0.28 IU/mL — ABNORMAL LOW (ref 0.30–0.70)
Heparin Unfractionated: 0.34 IU/mL (ref 0.30–0.70)
Heparin Unfractionated: 0.55 IU/mL (ref 0.30–0.70)

## 2019-03-05 LAB — GLUCOSE, CAPILLARY
Glucose-Capillary: 120 mg/dL — ABNORMAL HIGH (ref 70–99)
Glucose-Capillary: 152 mg/dL — ABNORMAL HIGH (ref 70–99)
Glucose-Capillary: 150 mg/dL — ABNORMAL HIGH (ref 70–99)
Glucose-Capillary: 130 mg/dL — ABNORMAL HIGH (ref 70–99)
Glucose-Capillary: 146 mg/dL — ABNORMAL HIGH (ref 70–99)
Glucose-Capillary: 200 mg/dL — ABNORMAL HIGH (ref 70–99)
Glucose-Capillary: 196 mg/dL — ABNORMAL HIGH (ref 70–99)
Glucose-Capillary: 219 mg/dL — ABNORMAL HIGH (ref 70–99)

## 2019-03-05 LAB — CULTURE, BLOOD (ROUTINE X 2)
Culture: NO GROWTH
Culture: NO GROWTH
Special Requests: ADEQUATE

## 2019-03-05 LAB — BASIC METABOLIC PANEL
Anion gap: 5 (ref 5–15)
BUN: 19 mg/dL (ref 8–23)
CO2: 27 mmol/L (ref 22–32)
Calcium: 7.6 mg/dL — ABNORMAL LOW (ref 8.9–10.3)
Chloride: 106 mmol/L (ref 98–111)
Creatinine, Ser: 0.42 mg/dL — ABNORMAL LOW (ref 0.44–1.00)
GFR calc Af Amer: >60 mL/min (ref >60)
GFR calc non Af Amer: >60 mL/min (ref >60)
Glucose, Bld: 150 mg/dL — ABNORMAL HIGH (ref 70–99)
Potassium: 3.7 mmol/L (ref 3.5–5.1)
Sodium: 138 mmol/L (ref 135–145)

## 2019-03-05 LAB — BLOOD GAS, ARTERIAL
Acid-Base Excess: 5 mmol/L — ABNORMAL HIGH (ref 0.0–2.0)
Bicarbonate: 31 mmol/L — ABNORMAL HIGH (ref 20.0–28.0)
FIO2: 0.3
O2 Saturation: 97.4 %
PEEP: 5 cmH2O
Patient temperature: 37
Pressure support: 5 cmH2O
pCO2 arterial: 50 mmHg — ABNORMAL HIGH (ref 32.0–48.0)
pH, Arterial: 7.4 (ref 7.350–7.450)
pO2, Arterial: 95 mmHg (ref 83.0–108.0)

## 2019-03-05 LAB — PREPARE RBC (CROSSMATCH)

## 2019-03-05 LAB — PHOSPHORUS: Phosphorus: 3.3 mg/dL (ref 2.5–4.6)

## 2019-03-05 LAB — MAGNESIUM: Magnesium: 2.1 mg/dL (ref 1.7–2.4)

## 2019-03-05 MED ORDER — METHYLPREDNISOLONE SODIUM SUCC 40 MG IJ SOLR
40.0000 mg | Freq: Two times a day (BID) | INTRAMUSCULAR | Status: DC
  Administered 2019-03-05 – 2019-03-08 (×7): 40 mg via INTRAVENOUS
  Filled 2019-03-05 (×7): qty 1

## 2019-03-05 MED ORDER — POTASSIUM CHLORIDE 20 MEQ PO PACK
40.0000 meq | PACK | Freq: Once | ORAL | Status: AC
  Administered 2019-03-05: 40 meq
  Filled 2019-03-05: qty 2

## 2019-03-05 MED ORDER — DEXMEDETOMIDINE HCL IN NACL 400 MCG/100ML IV SOLN
0.4000 ug/kg/h | INTRAVENOUS | Status: DC
  Administered 2019-03-06 – 2019-03-07 (×3): 0.4 ug/kg/h via INTRAVENOUS
  Administered 2019-03-07: 01:00:00 0.8 ug/kg/h via INTRAVENOUS
  Administered 2019-03-07 – 2019-03-08 (×3): 0.4 ug/kg/h via INTRAVENOUS
  Administered 2019-03-09 (×3): 1 ug/kg/h via INTRAVENOUS
  Administered 2019-03-09: 0.5 ug/kg/h via INTRAVENOUS
  Administered 2019-03-09: 23:00:00 1.2 ug/kg/h via INTRAVENOUS
  Administered 2019-03-09: 08:00:00 1 ug/kg/h via INTRAVENOUS
  Administered 2019-03-10 – 2019-03-11 (×6): 1.2 ug/kg/h via INTRAVENOUS
  Administered 2019-03-11 (×2): 1 ug/kg/h via INTRAVENOUS
  Filled 2019-03-05 (×23): qty 100

## 2019-03-05 MED ORDER — SODIUM CHLORIDE 0.9% IV SOLUTION
Freq: Once | INTRAVENOUS | Status: AC
  Administered 2019-03-05: 05:00:00 via INTRAVENOUS

## 2019-03-05 NOTE — Consult Note (Signed)
Pharmacy Antibiotic Note  Particia Wells is a 85 y.o. female admitted on 02/23/2019 with pneumonia.  Pharmacy has been consulted for cefepime and vancomycin dosing.  Plan: MRSA PCR positive. Patient's CXR improving and patient remains afebrile. Procalcitonin minimally elevated.   Continue Cefepime 2 g IV Q12hr for 7 days.   Continue Vancomycin 1000 mg IV Q24hr for 7 days of vancomycin therapy.   Will monitor renal function and obtain levels as warranted.   Height: 5' 2.01" (157.5 cm) Weight: 216 lb 7.9 oz (98.2 kg) IBW/kg (Calculated) : 50.12  Temp (24hrs), Avg:98.5 F (36.9 C), Min:97.7 F (36.5 C), Max:99.2 F (37.3 C)  Recent Labs  Lab 02/28/19 0205  03/01/19 1155 03/02/19 0527 03/03/19 0441 03/04/19 0415 03/04/19 2333 03/05/19 0419  WBC 16.3*  --  9.8 9.6 13.4* 9.2 9.4 9.4  CREATININE 0.65   < > 0.50 0.53 0.50 0.39*  --  0.42*  LATICACIDVEN 1.7  --   --   --   --   --   --   --    < > = values in this interval not displayed.    Estimated Creatinine Clearance: 57.3 mL/min (A) (by C-G formula based on SCr of 0.42 mg/dL (L)).    No Known Allergies  Antimicrobials this admission: Ceftriaxone 4/15 >> 4/18  Cefepime 4/20 >> 4/26  Vancomycin 4/20 >> 4/26.   Dose adjustments this admission: None  Microbiology results: 4/20 BCx: no growth x 4 days  4/19 SARS Coronavirus 2: negative  4/14 BCx: no growth  4/14 UCx: contaminant  4/13 MRSA PCR: positive   Thank you for allowing pharmacy to be a part of this patient's care.  Yolanda Wells 03/05/2019 8:52 AM

## 2019-03-05 NOTE — Progress Notes (Signed)
Pharmacy Electrolyte Monitoring Consult:  Pharmacy consulted to assist in monitoring and replacing electrolytes in this 85 y.o. female admitted on 03/08/2019 with Altered Mental Status and Shortness of Breath   Labs:  Sodium (mmol/L)  Date Value  03/05/2019 138  04/10/2015 137  07/08/2014 144   Potassium (mmol/L)  Date Value  03/05/2019 3.7  07/08/2014 3.7   Magnesium (mg/dL)  Date Value  03/05/2019 2.1  05/08/2014 2.6 (H)   Phosphorus (mg/dL)  Date Value  03/05/2019 3.3   Calcium (mg/dL)  Date Value  03/05/2019 7.6 (L)   Calcium, Total (mg/dL)  Date Value  07/08/2014 8.4 (L)   Albumin (g/dL)  Date Value  02/28/2019 3.1 (L)  07/08/2014 2.9 (L)     Assessment/Plan: KCl 40 mEq per tube x1  Will replace for goal potassium ~ 4 and goal magnesium ~ 2.   Will obtain electrolytes with am labs.   Pharmacy will continue to monitor and adjust per consult.   Rocky Morel 03/05/2019 8:38 AM

## 2019-03-05 NOTE — Progress Notes (Signed)
[place] [facility] [place] NAME: Yolanda Wells    MR#:  [id number]  DATE OF BIRTH:  1932-10-25  SUBJECTIVE: Patient is on vent support,  CHIEF COMPLAINT:   Chief Complaint  Patient presents with  . Altered Mental Status  . Shortness of Breath  Patient continues to be on mechanical ventilator   REVIEW OF SYSTEMS:  r  DRUG ALLERGIES:  No Known Allergies  VITALS:  Blood pressure 107/66, pulse 65, temperature 98.6 F (37 C), resp. rate 17, height 5' 2.01" (1.575 m), weight 98.2 kg, SpO2 98 %.  PHYSICAL EXAMINATION:   Physical Exam  GENERAL:  85 y.o.-year-old patient lying in the bed, obese. Critically ill EYES: Pupils equal, round, reactive to light and accommodation. No scleral icterus. Extraocular muscles intact.  HEENT: Head atraumatic, normocephalic. Oropharynx and nasopharynx clear.  NECK:  Supple, no jugular venous distention. No thyroid enlargement, no tenderness.  LUNGS: Decrease breath sounds bilateral, shallow, rapid CARDIOVASCULAR: S1, S2 normal. No murmurs, rubs, or gallops.  ABDOMEN: Soft, nontender, nondistended. Bowel sounds present. No organomegaly or mass.  EXTREMITIES: No cyanosis, clubbing. LE edema PSYCHIATRIC: could not be assessed  LABORATORY PANEL:   CBC Recent Labs  Lab 03/05/19 1132  WBC 10.8*  HGB PENDING  HCT PENDING  PLT 165   ------------------------------------------------------------------------------------------------------------------ Chemistries  Recent Labs  Lab 03/05/19 0419  NA 138  K 3.7  CL 106  CO2 27  GLUCOSE 150*  BUN 19  CREATININE 0.42*  CALCIUM 7.6*  MG 2.1   ------------------------------------------------------------------------------------------------------------------  Cardiac Enzymes No results for input(s): TROPONINI in the last 168 hours. ------------------------------------------------------------------------------------------------------------------  RADIOLOGY:   No results found.   ASSESSMENT AND PLAN:   * Acute on chronic hypoxic hypercarbic respiratory failure failure, COPD exacerbation, wean to extubate if possible today.  Continue antibiotics, steroids, nebulizers.  Pulm HTN, COPD and now PNA On mechanical ventilator Intensivist follow-up Continue vent bundle Weaning trials if parameters met  * RLL HCAP On  IV vancomycin and cefepime Duration of antibiotics as per ICU attending Cx sent and follow-up results  *Hypotension Support blood pressure with IV Neo-Synephrine   * Acute metabolic encephalopathy secondary to hypercarbia.  Has baseline mild cognitive impairment. On ventilator  *atrial  fibrillation with RVR, SVT,, patient is on heparin drip.  *COPD exacerbation improving slowly Continue nebulization therapy IV steroid therapy  *DVT prophylaxis On heparin drip for anticoagulation   Palliative care consulted and following.  All the records are reviewed and case discussed with Care Management/Social Worker Management plans discussed with the patient, family and they are in agreement.  CODE STATUS: FULL CODE  TOTAL TIME TAKING CARE OF THIS PATIENT: 35 minutes.   POSSIBLE D/C IN 1-2 DAYS, DEPENDING ON CLINICAL CONDITION.  Epifanio Lesches M.D on 03/05/2019 at 12:51 PM  Between 7am to 6pm - Pager - [phone]  After 6pm go to [email or web address] - password [email or web address] [facility]  Office  [phone]  CC: Primary care physician; [name], MD  Note: This dictation was prepared with Dragon dictation along with smaller phrase technology. Any transcriptional errors that result from this process are unintentional.

## 2019-03-05 NOTE — Progress Notes (Signed)
[place] for Heparin Drip Management  Indication: History of DVT/PE; patient on apixaban as an outpatient   No Known Allergies  Patient Measurements: Height: 5' 2.01" (157.5 cm) Weight: 206 lb 12.7 oz (93.8 kg) IBW/kg (Calculated) : 50.12 Heparin Dosing Weight: 71 kg.   Vital Signs: Temp: 97.7 F (36.5 C) (04/25 0445) Temp Source: Axillary (04/25 0445) BP: 92/52 (04/25 0445) Pulse Rate: 56 (04/25 0445)  Labs: Recent Labs    03/03/19 0441  03/04/19 0415 03/04/19 0801 03/04/19 2333 03/05/19 0419  HGB 8.2*  --  6.8*  --  6.3*  --   HCT 29.6*  --  24.2*  --  22.6*  --   PLT 174  --  161  --  156  --   HEPARINUNFRC 0.10*   < >  --  0.43 0.34 0.28*  CREATININE 0.50  --  0.39*  --   --  0.42*   < > = values in this interval not displayed.    Estimated Creatinine Clearance: 55.9 mL/min (A) (by C-G formula based on SCr of 0.42 mg/dL (L)).   Medical History: Past Medical History:  Diagnosis Date  . Acute deep vein thrombosis (DVT) of distal vein of left lower extremity ([place]) 10/03/2015  . Acute pulmonary embolism ([place]) 10/03/2015  . Anticoagulant long-term use   . Arthritis   . Arthritis   . CHF (congestive heart failure) ([place])   . Chronic anticoagulation 10/03/2015  . Collagen vascular disease ([place])   . Colostomy in place [facility]) 10/04/2015  . Deep venous thrombosis ([facility])    right lower extremity  . Dependent edema   . Diverticulosis of intestine with bleeding 04/02/2015  . Glenohumeral arthritis 06/28/2012  . Hammertoe 09/02/2012  . History of bilateral hip replacements 09/02/2012  . History of hysterectomy   . Hypertension   . Hypertension   . Hypokalemia   . Inability to ambulate due to hip 10/12/2015  . Mandibular dysfunction   . Onychomycosis 09/02/2012  . Osteoarthritis   . Polycythemia vera ([place])   . Presence of IVC filter 05/25/2015  . 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 Urinary incontinence in female  09/02/2016    Medications:  Scheduled:  . adenosine (ADENOCARD) IV  6 mg Intravenous Once  . aspirin  81 mg Per Tube Daily  . budesonide (PULMICORT) nebulizer solution  0.5 mg Nebulization BID  . chlorhexidine gluconate (MEDLINE KIT)  15 mL Mouth Rinse BID  . insulin aspart  0-9 Units Subcutaneous Q4H  . ipratropium-albuterol  3 mL Nebulization Q6H  . mouth rinse  15 mL Mouth Rinse 10 times per day  . multivitamin  15 mL Per Tube Daily  . pantoprazole sodium  40 mg Per Tube Daily  . QUEtiapine  25 mg Per Tube BID  . senna-docusate  1 tablet Oral BID  . sodium chloride flush  10-40 mL Intracatheter Q12H  . sodium chloride flush  3 mL Intravenous Q12H   Infusions:  . sodium chloride Stopped (03/01/19 0949)  . sodium chloride Stopped (03/02/19 1445)  . ceFEPime (MAXIPIME) IV Stopped (03/04/19 2151)  . dexmedetomidine (PRECEDEX) IV infusion 0.8 mcg/kg/hr (03/05/19 0047)  . feeding supplement (VITAL HIGH PROTEIN) 1,000 mL (03/04/19 1038)  . heparin 1,300 Units/hr (03/05/19 0113)  . norepinephrine (LEVOPHED) Adult infusion 1 mcg/min (03/05/19 0500)  . vancomycin Stopped (03/04/19 1418)    Assessment: Pharmacy consulted for heparin drip management for 85 yo female with history of recurrent DVTs; last DVT  2016. Patient takes apixban 30m BID as an outpatient. Patient admitted on 4/13 unable to tolerate oral medications and was started on enoxaparin 435mQ24hr at that time. Patient admitted to ICU on 4/21 with worsening respiratory and subsequently intubated. Order to start heparin infusion given post intubation. Patient last received enoxaparin 4060mt 2023 on 4/20. Heparin infusing at 1300 units/hr.   Goal of Therapy:  Heparin level 0.3-0.7 units/ml Monitor platelets by anticoagulation protocol: Yes   Plan:  04/25 @ 0400 HL 0.28 subtherapeutic, but drip was paused for 3.5 hours (2130 until 0100). Will continue rate at 1300 units/hr and will recheck HL @ 1200 considering patient's Hgb is  < 7 g/dL. Will continue to monitor.  DavTobie LordsharmD, BCPS Clinical Pharmacist 03/05/2019

## 2019-03-05 NOTE — Progress Notes (Signed)
[place] for Heparin Drip Management  Indication: History of DVT/PE; patient on apixaban as an outpatient   No Known Allergies  Patient Measurements: Height: 5' 2.01" (157.5 cm) Weight: 216 lb 7.9 oz (98.2 kg) IBW/kg (Calculated) : 50.12 Heparin Dosing Weight: 71 kg.   Vital Signs: Temp: 98.6 F (37 C) (04/25 1133) Temp Source: Axillary (04/25 0715) BP: 107/66 (04/25 1200) Pulse Rate: 65 (04/25 1200)  Labs: Recent Labs    03/03/19 0441  03/04/19 0415  03/04/19 2333 03/05/19 0419 03/05/19 1132  HGB 8.2*  --  6.8*  --  6.3* 6.4*  --   HCT 29.6*  --  24.2*  --  22.6* 22.7*  --   PLT 174  --  161  --  156 156  --   HEPARINUNFRC 0.10*   < >  --    < > 0.34 0.28* 0.21*  CREATININE 0.50  --  0.39*  --   --  0.42*  --    < > = values in this interval not displayed.    Estimated Creatinine Clearance: 57.3 mL/min (A) (by C-G formula based on SCr of 0.42 mg/dL (L)).   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 Patient takes apixban 5mg  BID as an outpatient. Patient admitted on 4/13 unable to tolerate oral medications and was started on enoxaparin 40mg  Q24hr at that time. Patient admitted to ICU on 4/21 with worsening respiratory and subsequently intubated. Order to start heparin infusion given post intubation. Patient last received enoxaparin 40mg  at 2023 on 4/20. Heparin infusing at 1300 units/hr.   Goal of Therapy:  Heparin level 0.3-0.7 units/ml Monitor platelets by anticoagulation protocol: Yes   Plan:  04/25 @ 1132 HL 0.21 subtherapeutic. Repeat CBC still pending. RN confirms heparin running at 13 ml/hr. Per RN no overt s/sx of bleeding; little leaking around central line but not enough to warrant dressing change; bruise on arm but can't say it is bigger. Per MD discussion this morning, no active bleeding, no bleeding from GI tract, ok to continue with heparin  drip.  RN also states no issues with infusing heparin.  Will cautiously increase heparin drip to 1400 units/hr with repeatedly low Hgb and will recheck HL @ 2100. CBC in AM. Will continue to monitor.  Rayna Sexton, PharmD, BCPS Clinical Pharmacist 03/05/2019 12:35 PM

## 2019-03-05 NOTE — Progress Notes (Signed)
No cuff leak heard at this time.  Extubation order discontinued.

## 2019-03-05 NOTE — Progress Notes (Signed)
Shift summary: unable to extubate this AM (no cuff leak). Pressors now off. Remains sedated on Precedex. Remains on heparin gtt. No acute changes this shift.

## 2019-03-05 NOTE — Progress Notes (Signed)
CRITICAL CARE NOTE  CC  follow up respiratory failure  SUBJECTIVE Patient remains critically ill Prognosis is guarded On abx for pneumonia On full vent support Failed SBT/SAT yesterday     SIGNIFICANT EVENTS 4/10 admitted for COPD exacerbation  4/12 discharged to home 4/13 re-admitted for COPD exacerbation, SVT 4/21 re-admitted to ICU for new RT sided pneumonia and severe resp failure, intubated #8 ETT 4/24 failed SAT/SBT   REVIEW OF SYSTEMS  PATIENT IS UNABLE TO PROVIDE COMPLETE REVIEW OF SYSTEMS DUE TO SEVERE CRITICAL ILLNESS   PHYSICAL EXAMINATION:  GENERAL:critically ill appearing, +resp distress HEAD: Normocephalic, atraumatic.  EYES: Pupils equal, round, reactive to light.  No scleral icterus.  MOUTH: Moist mucosal membrane. NECK: Supple. No thyromegaly. No nodules. No JVD.  PULMONARY: +rhonchi, +wheezing CARDIOVASCULAR: S1 and S2. Regular rate and rhythm. No murmurs, rubs, or gallops.  GASTROINTESTINAL: Soft, nontender, -distended. No masses. Positive bowel sounds. No hepatosplenomegaly.  MUSCULOSKELETAL: No swelling, clubbing, or edema.  NEUROLOGIC: obtunded, GCS<8 SKIN:intact,warm,dry  BP (!) 99/57   Pulse (!) 56   Temp 98.1 F (36.7 C) (Axillary)   Resp (!) 21   Ht 5' 2.01" (1.575 m)   Wt 98.2 kg   SpO2 100%   BMI 39.59 kg/m    CULTURE RESULTS   Recent Results (from the past 240 hour(s))  SARS Coronavirus 2 [facility] order, Performed in [place] hospital lab)     Status: None   Collection Time: 02/27/19 11:11 PM  Result Value Ref Range Status   SARS Coronavirus 2 NEGATIVE NEGATIVE Final    Comment: (NOTE) If result is NEGATIVE SARS-CoV-2 target nucleic acids are NOT DETECTED. The SARS-CoV-2 RNA is generally detectable in upper and lower  respiratory specimens during the acute phase of infection. The lowest  concentration of SARS-CoV-2 viral copies this assay can detect is 250  copies / mL. A negative result does not preclude SARS-CoV-2  infection  and should not be used as the sole basis for treatment or other  patient management decisions.  A negative result may occur with  improper specimen collection / handling, submission of specimen other  than nasopharyngeal swab, presence of viral mutation(s) within the  areas targeted by this assay, and inadequate number of viral copies  (<250 copies / mL). A negative result must be combined with clinical  observations, patient history, and epidemiological information. If result is POSITIVE SARS-CoV-2 target nucleic acids are DETECTED. The SARS-CoV-2 RNA is generally detectable in upper and lower  respiratory specimens dur ing the acute phase of infection.  Positive  results are indicative of active infection with SARS-CoV-2.  Clinical  correlation with patient history and other diagnostic information is  necessary to determine patient infection status.  Positive results do  not rule out bacterial infection or co-infection with other viruses. If result is PRESUMPTIVE POSTIVE SARS-CoV-2 nucleic acids MAY BE PRESENT.   A presumptive positive result was obtained on the submitted specimen  and confirmed on repeat testing.  While 2019 novel coronavirus  (SARS-CoV-2) nucleic acids may be present in the submitted sample  additional confirmatory testing may be necessary for epidemiological  and / or clinical management purposes  to differentiate between  SARS-CoV-2 and other Sarbecovirus currently known to infect humans.  If clinically indicated additional testing with an alternate test  methodology [phone]) is advised. The SARS-CoV-2 RNA is generally  detectable in upper and lower respiratory sp ecimens during the acute  phase of infection. The expected result is Negative. Fact Sheet for Patients:  StrictlyIdeas.no Fact Sheet for Healthcare Providers: BankingDealers.co.za This test is not yet approved or cleared by the [place]  FDA and has been authorized for detection and/or diagnosis of SARS-CoV-2 by FDA under an Emergency Use Authorization (EUA).  This EUA will remain in effect (meaning this test can be used) for the duration of the COVID-19 declaration under Section 564(b)(1) of the Act, 21 U.S.C. section 360bbb-3(b)(1), unless the authorization is terminated or revoked sooner. Performed at [facility], [address] [place], [place] [address]   CULTURE, BLOOD (ROUTINE X 2) w Reflex to ID Panel     Status: None   Collection Time: 02/28/19  1:40 AM  Result Value Ref Range Status   Specimen Description BLOOD RIGHT HAND  Final   Special Requests   Final    BOTTLES DRAWN AEROBIC ONLY Blood Culture results may not be optimal due to an inadequate volume of blood received in culture bottles   Culture   Final    NO GROWTH 5 DAYS Performed at [facility], [address] [place], [place] [address]    Report Status 03/05/2019 FINAL  Final  CULTURE, BLOOD (ROUTINE X 2) w Reflex to ID Panel     Status: None   Collection Time: 02/28/19  2:05 AM  Result Value Ref Range Status   Specimen Description BLOOD LEFT HAND  Final   Special Requests   Final    BOTTLES DRAWN AEROBIC AND ANAEROBIC Blood Culture adequate volume   Culture   Final    NO GROWTH 5 DAYS Performed at [facility], [address] [place], [place] [address]    Report Status 03/05/2019 FINAL  Final      CBC    Component Value Date/Time   WBC 9.4 03/05/2019 0419   RBC 2.72 (L) 03/05/2019 0419   HGB 6.4 (L) 03/05/2019 0419   HGB 13.8 07/08/2014 0936   HCT 22.7 (L) 03/05/2019 0419   HCT 42.0 07/08/2014 0936   PLT 156 03/05/2019 0419   PLT 834 (H) 07/08/2014 0936   MCV 83.5 03/05/2019 0419   MCV 104 (H) 07/08/2014 0936   MCH 23.5 (L) 03/05/2019 0419   MCHC 28.2 (L) 03/05/2019 0419   RDW 30.2 (H) 03/05/2019 0419   RDW 18.3 (H) 07/08/2014 0936   LYMPHSABS 0.6 (L) 03/04/2019 0415   LYMPHSABS 0.8 (L)  07/08/2014 0936   MONOABS 0.1 03/04/2019 0415   MONOABS 0.2 07/08/2014 0936   EOSABS 0.0 03/04/2019 0415   EOSABS 0.1 07/08/2014 0936   BASOSABS 0.0 03/04/2019 0415   BASOSABS 0.0 07/08/2014 0936   BMP Latest Ref Rng & Units 03/05/2019 03/04/2019 03/03/2019  Glucose 70 - 99 mg/dL 150(H) 176(H) 141(H)  BUN 8 - 23 mg/dL '19 18 15  '$ Creatinine 0.44 - 1.00 mg/dL 0.42(L) 0.39(L) 0.50  Sodium 135 - 145 mmol/L 138 140 143  Potassium 3.5 - 5.1 mmol/L 3.7 4.0 4.2  Chloride 98 - 111 mmol/L 106 105 107  CO2 22 - 32 mmol/L '27 30 29  '$ Calcium 8.9 - 10.3 mg/dL 7.6(L) 7.7(L) 8.1(L)          Indwelling Urinary Catheter continued, requirement due to   Reason to continue Indwelling Urinary Catheter for strict Intake/Output monitoring for hemodynamic instability         Ventilator continued, requirement due to, resp failure    Ventilator Sedation RASS 0 to -2     ASSESSMENT AND PLAN SYNOPSIS  Severe ACUTE Hypoxic and Hypercapnic Respiratory Failure from  COPD exacerbation and pneumonia  Severe ACUTE Hypoxic and Hypercapnic Respiratory Failure -continue Full MV support -continue Bronchodilator Therapy -Wean Fio2 and PEEP as tolerated -will perform SAT/SBT when respiratory parameters are met  SEVERE COPD EXACERBATION -continue IV steroids as prescribed -continue NEB THERAPY as prescribed -morphine as needed -wean fio2 as needed and tolerated    CARDIAC -afib with RVR and SVT -oxygen as needed -Lasix as tolerated -follow up cardiac enzymes as indicated   NEUROLOGY - intubated and sedated - minimal sedation to achieve a RASS goal: -1 Wake up assessment pending   CARDIAC ICU monitoring  ID -continue IV abx as prescibed -follow up cultures continue therapy for HCAP  GI/Nutrition GI PROPHYLAXIS as indicated DIET-->TF's as tolerated Constipation protocol as indicated  ENDO - ICU hypoglycemic\Hyperglycemia protocol -check FSBS per protocol   ELECTROLYTES -follow  labs as needed -replace as needed -pharmacy consultation and following   DVT/GI PRX ordered TRANSFUSIONS AS NEEDED MONITOR FSBS ASSESS the need for LABS as needed   Critical Care Time devoted to patient care services described in this note is 32 minutes.   Overall, patient is critically ill, prognosis is guarded.    Corrin Parker, M.D.  [facility]  Medical Director [place] Director [facility] Cardio-Pulmonary Department

## 2019-03-05 NOTE — Progress Notes (Signed)
[place] for Heparin Drip Management  Indication: History of DVT/PE; patient on apixaban as an outpatient   No Known Allergies  Patient Measurements: Height: 5' 2.01" (157.5 cm) Weight: 216 lb 7.9 oz (98.2 kg) IBW/kg (Calculated) : 50.12 Heparin Dosing Weight: 71 kg.   Vital Signs: Temp: 98.4 F (36.9 C) (04/25 1946) Temp Source: Axillary (04/25 1946) BP: 107/65 (04/25 1900) Pulse Rate: 54 (04/25 1900)  Labs: Recent Labs    03/03/19 0441  03/04/19 0415  03/04/19 2333 03/05/19 0419 03/05/19 1132 03/05/19 2053  HGB 8.2*  --  6.8*  --  6.3* 6.4* 7.7*  --   HCT 29.6*  --  24.2*  --  22.6* 22.7* 27.1*  --   PLT 174  --  161  --  156 156 165  --   HEPARINUNFRC 0.10*   < >  --    < > 0.34 0.28* 0.21* 0.55  CREATININE 0.50  --  0.39*  --   --  0.42*  --   --    < > = values in this interval not displayed.    Estimated Creatinine Clearance: 57.3 mL/min (A) (by C-G formula based on SCr of 0.42 mg/dL (L)).   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 Patient takes apixban 5mg  BID as an outpatient. Patient admitted on 4/13 unable to tolerate oral medications and was started on enoxaparin 40mg  Q24hr at that time. Patient admitted to ICU on 4/21 with worsening respiratory and subsequently intubated. Order to start heparin infusion given post intubation. Patient last received enoxaparin 40mg  at 2023 on 4/20. Heparin infusing at 1300 units/hr.   Goal of Therapy:  Heparin level 0.3-0.7 units/ml Monitor platelets by anticoagulation protocol: Yes   Plan:  04/25 @ 1132 HL 0.21 subtherapeutic.   Repeat CBC still pending. RN confirms heparin running at 13 ml/hr. Per RN no overt s/sx of bleeding; little leaking around central line but not enough to warrant dressing change; bruise on arm but can't say it is bigger. Per MD discussion this morning, no active bleeding, no  bleeding from GI tract, ok to continue with heparin drip.  RN also states no issues with infusing heparin.  Will cautiously increase heparin drip to 1400 units/hr with repeatedly low Hgb and will recheck HL @ 2100. CBC in AM. Will continue to monitor.  04/25 @ 2053 HL 0.55 therapeutic x 1, CBC and HL with am labs  Lu Duffel, PharmD, BCPS Clinical Pharmacist 03/05/2019 9:42 PM

## 2019-03-05 NOTE — Plan of Care (Signed)

## 2019-03-05 NOTE — Progress Notes (Signed)
Patient was assessed for SAT/SBT Patient did not have cuff leak Will treat with steroids and assess in 24 hrs

## 2019-03-06 DIAGNOSIS — J9602 Acute respiratory failure with hypercapnia: Secondary | ICD-10-CM

## 2019-03-06 LAB — BPAM RBC
Blood Product Expiration Date: 202004272359
ISSUE DATE / TIME: 202004250439
Unit Type and Rh: 5100

## 2019-03-06 LAB — CBC
HCT: 27.2 % — ABNORMAL LOW (ref 36.0–46.0)
Hemoglobin: 7.7 g/dL — ABNORMAL LOW (ref 12.0–15.0)
MCH: 23.8 pg — ABNORMAL LOW (ref 26.0–34.0)
MCHC: 28.3 g/dL — ABNORMAL LOW (ref 30.0–36.0)
MCV: 84 fL (ref 80.0–100.0)
Platelets: 202 10*3/uL (ref 150–400)
RBC: 3.24 MIL/uL — ABNORMAL LOW (ref 3.87–5.11)
RDW: 28 % — ABNORMAL HIGH (ref 11.5–15.5)
WBC: 14.7 10*3/uL — ABNORMAL HIGH (ref 4.0–10.5)
nRBC: 3.8 % — ABNORMAL HIGH (ref 0.0–0.2)

## 2019-03-06 LAB — BASIC METABOLIC PANEL
Anion gap: 6 (ref 5–15)
BUN: 22 mg/dL (ref 8–23)
CO2: 25 mmol/L (ref 22–32)
Calcium: 7.8 mg/dL — ABNORMAL LOW (ref 8.9–10.3)
Chloride: 102 mmol/L (ref 98–111)
Creatinine, Ser: 0.42 mg/dL — ABNORMAL LOW (ref 0.44–1.00)
GFR calc Af Amer: >60 mL/min (ref >60)
GFR calc non Af Amer: >60 mL/min (ref >60)
Glucose, Bld: 197 mg/dL — ABNORMAL HIGH (ref 70–99)
Potassium: 4.6 mmol/L (ref 3.5–5.1)
Sodium: 133 mmol/L — ABNORMAL LOW (ref 135–145)

## 2019-03-06 LAB — GLUCOSE, CAPILLARY
Glucose-Capillary: 231 mg/dL — ABNORMAL HIGH (ref 70–99)
Glucose-Capillary: 174 mg/dL — ABNORMAL HIGH (ref 70–99)
Glucose-Capillary: 149 mg/dL — ABNORMAL HIGH (ref 70–99)
Glucose-Capillary: 174 mg/dL — ABNORMAL HIGH (ref 70–99)
Glucose-Capillary: 181 mg/dL — ABNORMAL HIGH (ref 70–99)
Glucose-Capillary: 169 mg/dL — ABNORMAL HIGH (ref 70–99)

## 2019-03-06 LAB — TYPE AND SCREEN
ABO/RH(D): O POS
Antibody Screen: NEGATIVE
Unit division: 0

## 2019-03-06 LAB — MAGNESIUM: Magnesium: 2.2 mg/dL (ref 1.7–2.4)

## 2019-03-06 LAB — HEPARIN LEVEL (UNFRACTIONATED): Heparin Unfractionated: 0.47 IU/mL (ref 0.30–0.70)

## 2019-03-06 NOTE — Progress Notes (Addendum)
CRITICAL CARE NOTE  SYNOPSIS 85 yo morbidly obese AAF with end STAGE copd, +dvt/pe ADMITTED for acute resp failure from COPD exacerbation and pneumonia  CC  follow up respiratory failure  SUBJECTIVE Patient remains critically ill Prognosis is guarded Plan for SAT/SBt today On abx for pneumonia Steroids for COPD and possibel airway inflammation    COVID-19 NEGATIVE: Acute COVID-19 infection ruled out by PCR.   SIGNIFICANT EVENTS 4/10 admitted for COPD exacerbation  4/12 discharged to home 4/13 re-admitted for COPD exacerbation, SVT 4/21 re-admitted to ICU for new RT sided pneumonia and severe resp failure, intubated #8 ETT 4/24 failed SAT/SBT 4/25 started on steroids for COPD and prevention of postt extubation stridor  REVIEW OF SYSTEMS  PATIENT IS UNABLE TO PROVIDE COMPLETE REVIEW OF SYSTEMS DUE TO SEVERE CRITICAL ILLNESS   PHYSICAL EXAMINATION:  GENERAL:critically ill appearing, +resp distress HEAD: Normocephalic, atraumatic.  EYES: Pupils equal, round, reactive to light.  No scleral icterus.  MOUTH: Moist mucosal membrane. NECK: Supple. No thyromegaly. No nodules. No JVD.  PULMONARY: +rhonchi, +wheezing CARDIOVASCULAR: S1 and S2. Regular rate and rhythm. No murmurs, rubs, or gallops.  GASTROINTESTINAL: Soft, nontender, -distended. No masses. Positive bowel sounds. No hepatosplenomegaly.  MUSCULOSKELETAL: No swelling, clubbing, or edema.  NEUROLOGIC: obtunded, GCS<8 SKIN:intact,warm,dry  BP 122/63   Pulse (!) 57   Temp 98.7 F (37.1 C) (Axillary)   Resp 16   Ht 5' 2.01" (1.575 m)   Wt 98.7 kg   SpO2 99%   BMI 39.79 kg/m   Vent Mode: PRVC FiO2 (%):  [30 %] 30 % Set Rate:  [14 bmp] 14 bmp Vt Set:  [450 mL] 450 mL PEEP:  [5 cmH20] 5 cmH20 Pressure Support:  [5 cmH20] 5 cmH20 Plateau Pressure:  [17 cmH20-23 cmH20] 22 cmH20    CULTURE RESULTS   Recent Results (from the past 240 hour(s))  SARS Coronavirus 2 [facility] order, Performed in [facility]  hospital lab)     Status: None   Collection Time: 02/27/19 11:11 PM  Result Value Ref Range Status   SARS Coronavirus 2 NEGATIVE NEGATIVE Final    Comment: (NOTE) If result is NEGATIVE SARS-CoV-2 target nucleic acids are NOT DETECTED. The SARS-CoV-2 RNA is generally detectable in upper and lower  respiratory specimens during the acute phase of infection. The lowest  concentration of SARS-CoV-2 viral copies this assay can detect is 250  copies / mL. A negative result does not preclude SARS-CoV-2 infection  and should not be used as the sole basis for treatment or other  patient management decisions.  A negative result may occur with  improper specimen collection / handling, submission of specimen other  than nasopharyngeal swab, presence of viral mutation(s) within the  areas targeted by this assay, and inadequate number of viral copies  (<250 copies / mL). A negative result must be combined with clinical  observations, patient history, and epidemiological information. If result is POSITIVE SARS-CoV-2 target nucleic acids are DETECTED. The SARS-CoV-2 RNA is generally detectable in upper and lower  respiratory specimens dur ing the acute phase of infection.  Positive  results are indicative of active infection with SARS-CoV-2.  Clinical  correlation with patient history and other diagnostic information is  necessary to determine patient infection status.  Positive results do  not rule out bacterial infection or co-infection with other viruses. If result is PRESUMPTIVE POSTIVE SARS-CoV-2 nucleic acids MAY BE PRESENT.   A presumptive positive result was obtained on the submitted specimen  and confirmed on repeat  testing.  While 2019 novel coronavirus  (SARS-CoV-2) nucleic acids may be present in the submitted sample  additional confirmatory testing may be necessary for epidemiological  and / or clinical management purposes  to differentiate between  SARS-CoV-2 and other Sarbecovirus  currently known to infect humans.  If clinically indicated additional testing with an alternate test  methodology [phone]) is advised. The SARS-CoV-2 RNA is generally  detectable in upper and lower respiratory sp ecimens during the acute  phase of infection. The expected result is Negative. Fact Sheet for Patients:  StrictlyIdeas.no Fact Sheet for Healthcare Providers: BankingDealers.co.za This test is not yet approved or cleared by the [place] FDA and has been authorized for detection and/or diagnosis of SARS-CoV-2 by FDA under an Emergency Use Authorization (EUA).  This EUA will remain in effect (meaning this test can be used) for the duration of the COVID-19 declaration under Section 564(b)(1) of the Act, 21 U.S.C. section 360bbb-3(b)(1), unless the authorization is terminated or revoked sooner. Performed at [facility], [address] [place], [place] [address]   CULTURE, BLOOD (ROUTINE X 2) w Reflex to ID Panel     Status: None   Collection Time: 02/28/19  1:40 AM  Result Value Ref Range Status   Specimen Description BLOOD RIGHT HAND  Final   Special Requests   Final    BOTTLES DRAWN AEROBIC ONLY Blood Culture results may not be optimal due to an inadequate volume of blood received in culture bottles   Culture   Final    NO GROWTH 5 DAYS Performed at [facility], [address] [place], [place] [address]    Report Status 03/05/2019 FINAL  Final  CULTURE, BLOOD (ROUTINE X 2) w Reflex to ID Panel     Status: None   Collection Time: 02/28/19  2:05 AM  Result Value Ref Range Status   Specimen Description BLOOD LEFT HAND  Final   Special Requests   Final    BOTTLES DRAWN AEROBIC AND ANAEROBIC Blood Culture adequate volume   Culture   Final    NO GROWTH 5 DAYS Performed at [facility], [address] [place], [place] [address]    Report Status 03/05/2019 FINAL  Final            Indwelling Urinary Catheter continued, requirement due to   Reason to continue Indwelling Urinary Catheter for strict Intake/Output monitoring for hemodynamic instability         Ventilator continued, requirement due to, resp failure    Ventilator Sedation RASS 0 to -2     ASSESSMENT AND PLAN SYNOPSIS Severe ACUTE Hypoxic and Hypercapnic Respiratory Failurefrom COPD exacerbation and pneumonia  Severe ACUTE Hypoxic and Hypercapnic Respiratory Failure -continue Full MV support -continue Bronchodilator Therapy -Wean Fio2 and PEEP as tolerated -will perform SAT/SBT when respiratory parameters are met   SEVERE COPD EXACERBATION -continue IV steroids as prescribed -continue NEB THERAPY as prescribed -morphine as needed -wean fio2 as needed and tolerated    NEUROLOGY - intubated and sedated - minimal sedation to achieve a RASS goal: -1 Wake up assessment pending    CARDIAC ICU monitoring  ID -continue IV abx as prescibed -follow up cultures continue IV abx for HCAP  GI/Nutrition GI PROPHYLAXIS as indicated DIET-->TF's as tolerated Constipation protocol as indicated  ENDO - ICU hypoglycemic\Hyperglycemia protocol -check FSBS per protocol   ELECTROLYTES -follow labs as needed -replace as needed -pharmacy consultation and following   DVT/GI PRX ordered TRANSFUSIONS AS NEEDED MONITOR FSBS  ASSESS the need for LABS as needed   Critical Care Time devoted to patient care services described in this note is 32 minutes.   Overall, patient is critically ill, prognosis is guarded.    Patient remains full CODE  Corrin Parker, M.D.  [facility]  Medical Director [place] Director [facility] Cardio-Pulmonary Department

## 2019-03-06 NOTE — Consult Note (Signed)
Pharmacy Antibiotic Note  Yolanda Wells is a 85 y.o. female admitted on 03/04/2019 with pneumonia.  Pharmacy has been consulted for cefepime and vancomycin dosing.  Plan: MRSA PCR positive. Patient's CXR improving and patient remains afebrile. Procalcitonin minimally elevated.   Continue Cefepime 2 g IV Q12hr for 7 days.   Continue Vancomycin 1000 mg IV Q24hr for 7 days of vancomycin therapy.   Will monitor renal function and obtain levels as warranted. Renal function stable. Today is last day of therapy.   Height: 5' 2.01" (157.5 cm) Weight: 217 lb 9.5 oz (98.7 kg) IBW/kg (Calculated) : 50.12  Temp (24hrs), Avg:98.5 F (36.9 C), Min:98.1 F (36.7 C), Max:98.7 F (37.1 C)  Recent Labs  Lab 02/28/19 0205  03/02/19 0527 03/03/19 0441 03/04/19 0415 03/04/19 2333 03/05/19 0419 03/05/19 1132 03/06/19 0450  WBC 16.3*   < > 9.6 13.4* 9.2 9.4 9.4 10.8* 14.7*  CREATININE 0.65   < > 0.53 0.50 0.39*  --  0.42*  --  0.42*  LATICACIDVEN 1.7  --   --   --   --   --   --   --   --    < > = values in this interval not displayed.    Estimated Creatinine Clearance: 57.4 mL/min (A) (by C-G formula based on SCr of 0.42 mg/dL (L)).    No Known Allergies  Antimicrobials this admission: Ceftriaxone 4/15 >> 4/18  Cefepime 4/20 >> 4/26  Vancomycin 4/20 >> 4/26.   Dose adjustments this admission: None  Microbiology results: 4/20 BCx: no growth x 4 days  4/19 SARS Coronavirus 2: negative  4/14 BCx: no growth  4/14 UCx: contaminant  4/13 MRSA PCR: positive   Thank you for allowing pharmacy to be a part of this patient's care.  Rocky Morel 03/06/2019 8:13 AM

## 2019-03-06 NOTE — Progress Notes (Signed)
[place] at [place] NAME: Yolanda Wells    MR#:  [id number]  DATE OF BIRTH:  [birth year]  SUBJECTIVE: Patient is on vent support, because of COPD exacerbation, pneumonia.  CHIEF COMPLAINT:   Chief Complaint  Patient presents with  . Altered Mental Status  . Shortness of Breath  Patient continues to be on mechanical ventilator   REVIEW OF SYSTEMS:    DRUG ALLERGIES:  No Known Allergies  VITALS:  Blood pressure 122/69, pulse (!) 55, temperature 98.9 F (37.2 C), temperature source Axillary, resp. rate 15, height 5' 2.01" (1.575 m), weight 98.7 kg, SpO2 98 %.  PHYSICAL EXAMINATION:   Physical Exam  GENERAL:  85 y.o.-year-old patient lying in the bed, obese. Critically ill EYES: Pupils equal, round, reactive to light and accommodation. No scleral icterus. Extraocular muscles intact.  HEENT: Head atraumatic, normocephalic. Oropharynx and nasopharynx clear.  NECK:  Supple, no jugular venous distention. No thyroid enlargement, no tenderness.  LUNGS: Decrease breath sounds bilateral, shallow, rapid CARDIOVASCULAR: S1, S2 normal. No murmurs, rubs, or gallops.  ABDOMEN: Soft, nontender, nondistended. Bowel sounds present. No organomegaly or mass.  EXTREMITIES: No cyanosis, clubbing. LE edema PSYCHIATRIC: could not be assessed  LABORATORY PANEL:   CBC Recent Labs  Lab 03/06/19 0450  WBC 14.7*  HGB 7.7*  HCT 27.2*  PLT 202   ------------------------------------------------------------------------------------------------------------------ Chemistries  Recent Labs  Lab 03/06/19 0450  NA 133*  K 4.6  CL 102  CO2 25  GLUCOSE 197*  BUN 22  CREATININE 0.42*  CALCIUM 7.8*  MG 2.2   ------------------------------------------------------------------------------------------------------------------  Cardiac Enzymes No results for input(s): TROPONINI in the last 168  hours. ------------------------------------------------------------------------------------------------------------------  RADIOLOGY:  No results found.   ASSESSMENT AND PLAN:   * Acute on chronic hypoxic hypercarbic respiratory failure failure, COPD exacerbation,, pneumonia, continue IV antibiotics vancomycin, cefepime., bronchodilators, wean as tolerated.  Pulm HTN, COPD and now PNA On mechanical ventilator Intensivist follow-up Continue vent bundle Weaning trials if parameters met  * RLL HCAP On  IV vancomycin and cefepime Duration of antibiotics as per ICU attending Cx sent and follow-up results  *Hypotension Better. * Acute metabolic encephalopathy secondary to hypercarbia.  Has baseline mild cognitive impairment. On ventilator  *atrial  fibrillation with RVR, SVT,, patient is on heparin drip.  *COPD exacerbation improving slowly Continue nebulization therapy IV steroid therapy  *DVT prophylaxis On heparin drip for anticoagulation Dementia, agitation, continue Seroquel, Ativan. Palliative care consulted and following.  All the records are reviewed and case discussed with Care Management/Social Worker Management plans discussed with the patient, family and they are in agreement.  CODE STATUS: FULL CODE  TOTAL TIME TAKING CARE OF THIS PATIENT: 35 minutes.   POSSIBLE D/C IN 1-2 DAYS, DEPENDING ON CLINICAL CONDITION.  Epifanio Lesches M.D on 03/06/2019 at 1:36 PM  Between 7am to 6pm - Pager - [phone]  After 6pm go to [email or web address] - password [email or web address] [facility]  Office  [phone]  CC: Primary care physician; [name], MD  Note: This dictation was prepared with Dragon dictation along with smaller phrase technology. Any transcriptional errors that result from this process are unintentional.

## 2019-03-06 NOTE — Progress Notes (Signed)
[facility] for Heparin Drip Management  Indication: History of DVT/PE; patient on apixaban as an outpatient   No Known Allergies  Patient Measurements: Height: 5' 2.01" (157.5 cm) Weight: 217 lb 9.5 oz (98.7 kg) IBW/kg (Calculated) : 50.12 Heparin Dosing Weight: 71 kg.   Vital Signs: Temp: 98.7 F (37.1 C) (04/26 0400) Temp Source: Axillary (04/26 0000) BP: 108/58 (04/25 2200) Pulse Rate: 57 (04/25 2200)  Labs: Recent Labs    03/04/19 0415  03/04/19 2333 03/05/19 0419 03/05/19 1132 03/05/19 2053 03/06/19 0450  HGB 6.8*  --  6.3* 6.4* 7.7*  --   --   HCT 24.2*  --  22.6* 22.7* 27.1*  --   --   PLT 161  --  156 156 165  --   --   HEPARINUNFRC  --    < > 0.34 0.28* 0.21* 0.55 0.47  CREATININE 0.39*  --   --  0.42*  --   --  0.42*   < > = values in this interval not displayed.    Estimated Creatinine Clearance: 57.4 mL/min (A) (by C-G formula based on SCr of 0.42 mg/dL (L)).   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 Patient takes apixban 5mg  BID as an outpatient. Patient admitted on 4/13 unable to tolerate oral medications and was started on enoxaparin 40mg  Q24hr at that time. Patient admitted to ICU on 4/21 with worsening respiratory and subsequently intubated. Order to start heparin infusion given post intubation. Patient last received enoxaparin 40mg  at 2023 on 4/20. Heparin infusing at 1300 units/hr.   Goal of Therapy:  Heparin level 0.3-0.7 units/ml Monitor platelets by anticoagulation protocol: Yes   Plan:  04/26 @ 0500 HL 0.47 therapeutic. Will continue current rate at 1400 units/hr and will recheck HL w/ am labs. hgb low-stable will continue to monitor.  Tobie Lords, PharmD, BCPS Clinical Pharmacist 03/06/2019 5:53 AM

## 2019-03-06 NOTE — Progress Notes (Signed)
Pharmacy Electrolyte Monitoring Consult:  Pharmacy consulted to assist in monitoring and replacing electrolytes in this 85 y.o. female admitted on 03/01/2019 with Altered Mental Status and Shortness of Breath   Labs:  Sodium (mmol/L)  Date Value  03/06/2019 133 (L)  04/10/2015 137  07/08/2014 144   Potassium (mmol/L)  Date Value  03/06/2019 4.6  07/08/2014 3.7   Magnesium (mg/dL)  Date Value  03/06/2019 2.2  05/08/2014 2.6 (H)   Phosphorus (mg/dL)  Date Value  03/05/2019 3.3   Calcium (mg/dL)  Date Value  03/06/2019 7.8 (L)   Calcium, Total (mg/dL)  Date Value  07/08/2014 8.4 (L)   Albumin (g/dL)  Date Value  02/14/2019 3.1 (L)  07/08/2014 2.9 (L)     Assessment/Plan: No supplementation needed at this time.   Will replace for goal potassium ~ 4 and goal magnesium ~ 2.   Will obtain electrolytes with am labs.   Pharmacy will continue to monitor and adjust per consult.   Rocky Morel 03/06/2019 8:09 AM

## 2019-03-07 LAB — BASIC METABOLIC PANEL
Anion gap: 5 (ref 5–15)
BUN: 25 mg/dL — ABNORMAL HIGH (ref 8–23)
CO2: 26 mmol/L (ref 22–32)
Calcium: 7.9 mg/dL — ABNORMAL LOW (ref 8.9–10.3)
Chloride: 106 mmol/L (ref 98–111)
Creatinine, Ser: 0.42 mg/dL — ABNORMAL LOW (ref 0.44–1.00)
GFR calc Af Amer: >60 mL/min (ref >60)
GFR calc non Af Amer: >60 mL/min (ref >60)
Glucose, Bld: 209 mg/dL — ABNORMAL HIGH (ref 70–99)
Potassium: 4.4 mmol/L (ref 3.5–5.1)
Sodium: 137 mmol/L (ref 135–145)

## 2019-03-07 LAB — GLUCOSE, CAPILLARY
Glucose-Capillary: 19 mg/dL — CL (ref 70–99)
Glucose-Capillary: 23 mg/dL — CL (ref 70–99)
Glucose-Capillary: 180 mg/dL — ABNORMAL HIGH (ref 70–99)
Glucose-Capillary: 187 mg/dL — ABNORMAL HIGH (ref 70–99)
Glucose-Capillary: 194 mg/dL — ABNORMAL HIGH (ref 70–99)
Glucose-Capillary: 193 mg/dL — ABNORMAL HIGH (ref 70–99)
Glucose-Capillary: 151 mg/dL — ABNORMAL HIGH (ref 70–99)

## 2019-03-07 LAB — HEPARIN LEVEL (UNFRACTIONATED): Heparin Unfractionated: 0.46 IU/mL (ref 0.30–0.70)

## 2019-03-07 LAB — CBC
HCT: 28 % — ABNORMAL LOW (ref 36.0–46.0)
Hemoglobin: 7.9 g/dL — ABNORMAL LOW (ref 12.0–15.0)
MCH: 23.9 pg — ABNORMAL LOW (ref 26.0–34.0)
MCHC: 28.2 g/dL — ABNORMAL LOW (ref 30.0–36.0)
MCV: 84.8 fL (ref 80.0–100.0)
Platelets: 239 10*3/uL (ref 150–400)
RBC: 3.3 MIL/uL — ABNORMAL LOW (ref 3.87–5.11)
RDW: 28.5 % — ABNORMAL HIGH (ref 11.5–15.5)
WBC: 12.9 10*3/uL — ABNORMAL HIGH (ref 4.0–10.5)
nRBC: 4.4 % — ABNORMAL HIGH (ref 0.0–0.2)

## 2019-03-07 LAB — PHOSPHORUS: Phosphorus: 3 mg/dL (ref 2.5–4.6)

## 2019-03-07 LAB — MAGNESIUM: Magnesium: 2.1 mg/dL (ref 1.7–2.4)

## 2019-03-07 NOTE — Progress Notes (Signed)
Nutrition Follow-up  RD working remotely.  DOCUMENTATION CODES:   Obesity unspecified  INTERVENTION:  Continue Vital High Protein at 50 mL/hr (1200 mL goal daily volume) per NGT. Provides 1200 kcal, 105 grams of protein, 1008 mL H2O daily.  Continue liquid MVI daily per tube.  Continue minimum free water flush of 30 mL Q4hrs to maintain tube patency.  NUTRITION DIAGNOSIS:   Inadequate oral intake related to inability to eat as evidenced by NPO status.  Ongoing - addressing with TF regimen.  GOAL:   Provide needs based on ASPEN/SCCM guidelines  Met with TF regimen.  MONITOR:   Vent status, Labs, Weight trends, TF tolerance, Skin, I & O's  REASON FOR ASSESSMENT:   Ventilator, Consult Enteral/tube feeding initiation and management  ASSESSMENT:   85 year old female with PMHx of HTN, hx DVT, arthritis, hx CVA 2016, diverticulosis, CHF, dementia admitted with acute exacerbation of COPD requiring intubation on 4/21.  Patient remains intubated and sedated. Continues to fail SBTs. On PRVC mode with FiO2 30% and PEEP 5 cmH2O. Abdomen soft per RN documentation. Having colostomy output.  Enteral Access: NGT placed 4/21; terminates in distal stomach or proximal duodenum per abdominal x-ray 4/21; 71 cm  MAP: 68-95 mmHg  TF: pt tolerating VHP @ 50 mL/hr  Patient is currently intubated on ventilator support Ve: 7.6 L/min Temp (24hrs), Avg:98.6 F (37 C), Min:97.9 F (36.6 C), Max:99.1 F (37.3 C)  Propofol: N/A  Medications reviewed and include: Novolog 0-9 units Q4hrs, Solu-Medrol 40 mg Q12hrs IV, liquid MVI daily per tube, Protonix 40 mg daily per tube, Seroquel 25 mg BID per tube, senna-docusate 1 tablet BID, Precedex gtt, heparin gtt.  Labs reviewed: CBG 169-194, BUN 25, Creatinine 0.42.  I/O: 760 mL UOP yesterday (0.3 mL/kg/hr)  Weight trend: 99.5 kg on 4/27; +11.5 kg from 4/13  Discussed with RN and on rounds.  Diet Order:   Diet Order            Diet NPO  time specified  Diet effective now             EDUCATION NEEDS:   No education needs have been identified at this time  Skin:  Skin Assessment: Skin Integrity Issues:(unstageable coccyx; stage II buttocks; MSAD buttocks; ecchymosis; s/p amputation of toes)  Last BM:  03/07/2019 - 25 mL per colostomy  Height:   Ht Readings from Last 1 Encounters:  03/05/19 5' 2.01" (1.575 m)   Weight:   Wt Readings from Last 1 Encounters:  03/07/19 99.5 kg   Ideal Body Weight:  50 kg  BMI:  Body mass index is 40.11 kg/m.  Estimated Nutritional Needs:   Kcal:  [phone] (11-14 kcal/kg)  Protein:  100 grams (2 grams/kg IBW)  Fluid:  1.25 L/day (25 mL/kg IBW)  Willey Blade, MS, RD, LDN Office: [phone] Pager: [phone] After Hours/Weekend Pager: [phone]

## 2019-03-07 NOTE — Progress Notes (Signed)
[place] at [place] NAME: Solyana Nonaka    MR#:  [id number]  DATE OF BIRTH:  February 03, 1932  SUBJECTIVE: Patient is on vent support, because of COPD exacerbation, pneumonia.  CHIEF COMPLAINT:   Chief Complaint  Patient presents with  . Altered Mental Status  . Shortness of Breath  Patient continues to be on mechanical ventilator   REVIEW OF SYSTEMS:    DRUG ALLERGIES:  No Known Allergies  VITALS:  Blood pressure 110/64, pulse (!) 54, temperature 99.1 F (37.3 C), temperature source Axillary, resp. rate 14, height 5' 2.01" (1.575 m), weight 99.5 kg, SpO2 100 %.  PHYSICAL EXAMINATION:   Physical Exam  GENERAL:  85 y.o.-year-old patient lying in the bed, obese. Critically ill EYES: Pupils equal, round, reactive to light and accommodation. No scleral icterus. Extraocular muscles intact.  HEENT: Head atraumatic, normocephalic. Oropharynx and nasopharynx clear.  NECK:  Supple, no jugular venous distention. No thyroid enlargement, no tenderness.  LUNGS: Decrease breath sounds bilateral, shallow, rapid CARDIOVASCULAR: S1, S2 normal. No murmurs, rubs, or gallops.  ABDOMEN: Soft, nontender, nondistended. Bowel sounds present. No organomegaly or mass.  EXTREMITIES: No cyanosis, clubbing. LE edema PSYCHIATRIC: could not be assessed  LABORATORY PANEL:   CBC Recent Labs  Lab 03/07/19 0334  WBC 12.9*  HGB 7.9*  HCT 28.0*  PLT 239   ------------------------------------------------------------------------------------------------------------------ Chemistries  Recent Labs  Lab 03/07/19 0334  NA 137  K 4.4  CL 106  CO2 26  GLUCOSE 209*  BUN 25*  CREATININE 0.42*  CALCIUM 7.9*  MG 2.1   ------------------------------------------------------------------------------------------------------------------  Cardiac Enzymes No results for input(s): TROPONINI in the last 168  hours. ------------------------------------------------------------------------------------------------------------------  RADIOLOGY:  No results found.   ASSESSMENT AND PLAN:   * Acute on chronic hypoxic hypercarbic respiratory failure failure, COPD exacerbation,, pneumonia, continue IV antibiotics vancomycin, cefepime., bronchodilators, wean as tolerated.  Pulm HTN, COPD and now PNA On mechanical ventilator Intensivist follow-up Continue vent bundle Weaning trials if parameters met  * RLL HCAP On  IV vancomycin and cefepime Duration of antibiotics as per ICU attending Cx sent and follow-up results  *Hypotension Better. * Acute metabolic encephalopathy secondary to hypercarbia.  Has baseline mild cognitive impairment. On ventilator  *atrial  fibrillation with RVR, SVT,, patient is on heparin drip.  *COPD exacerbation improving slowly Continue nebulization therapy IV steroid therapy  *DVT prophylaxis On heparin drip for anticoagulation Dementia, agitation, continue Seroquel, Ativan. Palliative care consulted and following.  All the records are reviewed and case discussed with Care Management/Social Worker Management plans discussed with the patient, family and they are in agreement.  CODE STATUS: FULL CODE  TOTAL TIME TAKING CARE OF THIS PATIENT: 35 minutes.   POSSIBLE D/C IN 1-2 DAYS, DEPENDING ON CLINICAL CONDITION.  Epifanio Lesches M.D on 03/07/2019 at 12:53 PM  Between 7am to 6pm - Pager - [phone]  After 6pm go to [email or web address] - password [email or web address] [facility]  Office  [phone]  CC: Primary care physician; [name], MD  Note: This dictation was prepared with Dragon dictation along with smaller phrase technology. Any transcriptional errors that result from this process are unintentional.

## 2019-03-07 NOTE — Progress Notes (Addendum)
Daily Progress Note   Patient Name: Yolanda Wells       Date: 03/07/2019 DOB: 04-Oct-1932  Age: 85 y.o. MRN#: [id number] Attending Physician: Epifanio Lesches, MD Primary Care Physician: [name], MD Admit Date: 03/04/2019  Reason for Consultation/Follow-up: Establishing goals of care  Subjective: Patient resting in bed on ventilator. Per CCM team, she has not done well with SBT's. Nurse states she spoke with the granddaughter today, and granddaughter indicated to her that they had until Wednesday to make a decision on trach/peg vs comfort. Attempted to reach out to granddaughter [name] to offer support, she states her grandmother has a living will stating she does not want to live in a vegitative state. She states she wants to honor her grandmother's wishes as HPOA, but it is difficult. She states she does not want her grandmother to suffer. She states she wants moe to conference call her and her uncle tomorrow to discuss further. She is leaning toward comfort care, but states she has not discussed this with the uncle as she does not want "the turmoil right now."   I will call her and her uncle tomorrow morning at 10:00. Leaning toward comfort care. Comfort care explained.    Length of Stay: 14  Current Medications: Scheduled Meds:  . adenosine (ADENOCARD) IV  6 mg Intravenous Once  . aspirin  81 mg Per Tube Daily  . budesonide (PULMICORT) nebulizer solution  0.5 mg Nebulization BID  . chlorhexidine gluconate (MEDLINE KIT)  15 mL Mouth Rinse BID  . insulin aspart  0-9 Units Subcutaneous Q4H  . ipratropium-albuterol  3 mL Nebulization Q6H  . mouth rinse  15 mL Mouth Rinse 10 times per day  . methylPREDNISolone (SOLU-MEDROL) injection  40 mg Intravenous Q12H  . multivitamin  15 mL  Per Tube Daily  . pantoprazole sodium  40 mg Per Tube Daily  . QUEtiapine  25 mg Per Tube BID  . senna-docusate  1 tablet Oral BID  . sodium chloride flush  10-40 mL Intracatheter Q12H  . sodium chloride flush  3 mL Intravenous Q12H    Continuous Infusions: . sodium chloride Stopped (03/06/19 0016)  . sodium chloride Stopped (03/02/19 1445)  . dexmedetomidine (PRECEDEX) IV infusion 0.4 mcg/kg/hr (03/07/19 0800)  . feeding supplement (VITAL HIGH PROTEIN) 1,000 mL (03/07/19 0732)  . heparin  1,400 Units/hr (03/07/19 0800)  . norepinephrine (LEVOPHED) Adult infusion Stopped (03/05/19 0749)    PRN Meds: sodium chloride, acetaminophen **OR** acetaminophen, fentaNYL (SUBLIMAZE) injection, ipratropium-albuterol, lip balm, LORazepam, metoprolol tartrate, [DISCONTINUED] ondansetron **OR** ondansetron (ZOFRAN) IV, polyethylene glycol, sodium chloride flush  General: Intubated Lungs: Intubated  Neurologic: intubated         Vital Signs: BP 110/64 (BP Location: Left Wrist)   Pulse (!) 54   Temp 99.1 F (37.3 C) (Axillary)   Resp 14   Ht 5' 2.01" (1.575 m)   Wt 99.5 kg   SpO2 100%   BMI 40.11 kg/m   SpO2: SpO2: 100 % O2 Device: O2 Device: Ventilator O2 Flow Rate: O2 Flow Rate (L/min): 2 L/min  Intake/output summary:   Intake/Output Summary (Last 24 hours) at 03/07/2019 1221 Last data filed at 03/07/2019 0800 Gross per 24 hour  Intake 954.11 ml  Output 795 ml  Net 159.11 ml   LBM: Last BM Date: 03/07/19 Baseline Weight: Weight: 90 kg Most recent weight: Weight: 99.5 kg       Palliative Assessment/Data: INTUBATED     Flowsheet Rows     Most Recent Value  Intake Tab  Referral Department  Hospitalist  Unit at Time of Referral  Med/Surg Unit  Palliative Care Primary Diagnosis  Pulmonary  Date Notified  03/06/2019  Palliative Care Type  Return patient Palliative Care  Reason for referral  Clarify Goals of Care  Date of Admission  02/17/2019  Date first seen by Palliative Care   02/22/19  # of days Palliative referral response time  1 Day(s)  # of days IP prior to Palliative referral  0  Clinical Assessment  Palliative Performance Scale Score  30%  Pain Max last 24 hours  Not able to report  Pain Min Last 24 hours  Not able to report  Dyspnea Max Last 24 Hours  Not able to report  Dyspnea Min Last 24 hours  Not able to report  Psychosocial & Spiritual Assessment  Palliative Care Outcomes      Patient Active Problem List   Diagnosis Date Noted  . Acute respiratory failure with hypoxia and hypercapnia (HCC)   . Acute on chronic respiratory failure ([place]) 02/11/2019  . Decreased appetite 02/02/2019  . Poor appetite 12/24/2018  . Unsteady 12/24/2018  . Palliative care encounter 12/24/2018  . SOB (shortness of breath) 10/28/2018  . Shortness of breath 09/27/2018  . COPD exacerbation ([place]) 09/22/2018  . Sepsis ([place]) 09/22/2018  . Lower GI bleed 09/09/2018  . AMS (altered mental status) 08/30/2018  . Pressure injury of skin 08/13/2018  . B12 deficiency 08/04/2018  . Anemia 06/07/2018  . Secondary myelofibrosis ([place]) 02/17/2018  . Goals of care, counseling/discussion 12/23/2017  . Palliative care by specialist   . DNR (do not resuscitate) discussion   . Weakness generalized   . Chronic diastolic CHF (congestive heart failure) ([place]) 07/28/2017  . Vascular dementia without behavioral disturbance ([place]) 07/08/2017  . Influenza with respiratory manifestation 12/01/2016  . Respiratory distress 11/23/2016  . Urinary incontinence in female 09/02/2016  . Vision loss of right eye 08/26/2016  . Blindness of right eye 08/26/2016  . Inability to ambulate due to hip 10/12/2015  . Colostomy in place [facility]) 10/04/2015  . Long term current use of anticoagulant therapy 10/03/2015  . Leukocytosis 09/25/2015  . CAP (community acquired pneumonia) 09/24/2015  . COPD (chronic obstructive pulmonary disease) ([place]) 09/24/2015  . UTI (urinary tract infection) 07/16/2015  .  Abdominal wall cellulitis  07/15/2015  . DVT (deep venous thrombosis) ([place]) 07/15/2015  . HTN (hypertension) 07/15/2015  . Polycythemia vera ([place]) 07/15/2015  . Arthritis 07/15/2015  . Pulmonary emboli ([place]) 05/25/2015  . Diverticulosis of colon with hemorrhage 04/02/2015  . Diverticulosis of intestine with bleeding 04/02/2015  . Fall 01/10/2015  . Osteoarthritis 01/10/2015  . Hammertoe 09/02/2012  . S/P transmetatarsal amputation of foot ([place]) 09/02/2012  . Onychomycosis 09/02/2012  . Other specified dermatoses 09/02/2012  . S/P hip replacement 08/23/2012  . Glenohumeral arthritis 06/28/2012    Palliative Care Assessment & Plan   Patient Profile: Per [name], NP: 85 y.o. female  with past medical history of  heart failure, long-term anticoagulation use, polycythemia vera, collagen vascular disease cerebellar stroke, history of PE with IVC filter, history of DVT left lower extremity / right lower extremity, bilateral hip replacements, osteoarthritis, diverticulosis with intestinal bleeding, colostomy 2016, hypertension, arthritis, partial right foot amputation admitted on 02/12/2019 with acute on chronic hypoxic/hypercarbic respiratory failure likely secondary to acute on chronic diastolic heart failure.  PMT consulted for goals of care, 8 admissions and 3 ED visits in 6 months  Recommendations/Plan:  Full Code  Full aggressive medical interventions  Family in discussion about how to proceed with care in future, trach, peg, limitations to intubation period.   PMT will continue to follow and support as able  Leaning toward comfort care. I will reach back out at 10:00 tomorrow morning.    Goals of Care and Additional Recommendations:  Limitations on Scope of Treatment: Full Scope Treatment  Code Status:    Code Status Orders  (From admission, onward)         Start     Ordered   03/04/2019 1349  Full code  Continuous     02/20/2019 1349        Code Status History    Date  Active Date Inactive Code Status Order ID Comments User Context   02/18/2019 1813 02/20/2019 2309 Full Code [id number]  Otila Back, MD Inpatient   11/08/2018 1532 11/10/2018 2032 DNR [id number]  Dustin Flock, MD Inpatient   10/28/2018 1334 10/31/2018 1621 DNR [id number]  Dustin Flock, MD Inpatient   09/27/2018 1901 09/30/2018 1545 DNR [id number]  Loletha Grayer, MD ED   09/22/2018 0133 09/25/2018 2201 Full Code [id number]  Amelia Jo, MD Inpatient   09/09/2018 1240 09/12/2018 2008 Full Code [id number]  Loletha Grayer, MD ED   08/30/2018 2018 09/01/2018 2004 DNR [id number]  Nicholes Mango, MD ED   08/30/2018 1903 08/30/2018 2018 Full Code [id number]  Nicholes Mango, MD ED   08/13/2018 1038 08/15/2018 1836 Full Code [id number]  Loletha Grayer, MD Inpatient   08/12/2018 2001 08/13/2018 1038 DNR [id number]  Gorden Harms, MD ED   08/12/2018 2001 08/12/2018 2001 Full Code [id number]  Gorden Harms, MD ED   06/15/2018 1635 06/18/2018 2009 Full Code [id number]  [name], MD Inpatient   02/24/2018 0347 02/27/2018 0027 Full Code [id number]  Amelia Jo, MD Inpatient   10/15/2017 1141 10/16/2017 2204 Full Code [id number]  Knox Royalty, NP Inpatient   10/13/2017 2246 10/15/2017 1141 DNR [id number]  Lance Coon, MD ED   10/06/2017 1454 10/09/2017 2058 DNR [id number]  Nicholes Mango, MD Inpatient   10/06/2017 1111 10/06/2017 1453 Full Code [id number]  Nicholes Mango, MD Inpatient   05/27/2017 1954 05/28/2017 2013 Full Code [id number]  Vaughan Basta, MD Inpatient   05/23/2017 2322 05/26/2017 1614 Full Code [id number]  Lance Coon, MD Inpatient   04/10/2017 0325 04/11/2017 1916 Full Code  [id number]  Harrie Foreman, MD Inpatient   11/23/2016 0629 12/01/2016 1935 Full Code [id number]  Harrie Foreman, MD Inpatient   08/26/2016 0445 08/26/2016 1923 Full Code [id number]  Saundra Shelling, MD Inpatient   09/22/2015 1755 09/25/2015 1844 Full Code [id number]  Demetrios Loll, MD ED   09/15/2015 0341 09/17/2015 1741 Full Code  [id number]  Harrie Foreman, MD Inpatient   07/16/2015 0151 07/20/2015 2334 Full Code [id number]  Lance Coon, MD Inpatient   05/25/2015 0834 05/29/2015 1616 Full Code [id number]  Harrie Foreman, MD Inpatient   04/02/2015 0606 04/08/2015 1651 Full Code [id number]  Harrie Foreman, MD Inpatient      Prognosis:   POOR   Discharge Planning:  To Be Determined   The above conversation was completed via telephone due to the visitor restrictions during the COVID-19 pandemic. Thorough chart review and discussion with necessary members of the care team was completed as part of assessment.   Thank you for allowing the Palliative Medicine Team to assist in the care of this patient.  Total Time: 35 min.

## 2019-03-07 NOTE — Progress Notes (Signed)
RSBI is 40.1 with a noticeable cuff leak

## 2019-03-07 NOTE — Progress Notes (Signed)
The cuff on the ETT was deflated and there is a cuff leak.

## 2019-03-07 NOTE — Progress Notes (Signed)
Assisted tele visit to patient with family member.  Yolanda Wells R, RN  

## 2019-03-07 NOTE — Progress Notes (Signed)
[facility] for Heparin Drip Management  Indication: History of DVT/PE; patient on apixaban as an outpatient   No Known Allergies  Patient Measurements: Height: 5' 2.01" (157.5 cm) Weight: 219 lb 5.7 oz (99.5 kg) IBW/kg (Calculated) : 50.12 Heparin Dosing Weight: 71 kg.   Vital Signs: Temp: 98.4 F (36.9 C) (04/27 0400) Temp Source: Axillary (04/27 0400) BP: 112/64 (04/27 0300) Pulse Rate: 55 (04/27 0300)  Labs: Recent Labs    03/05/19 0419 03/05/19 1132 03/05/19 2053 03/06/19 0450 03/07/19 0334  HGB 6.4* 7.7*  --  7.7*  --   HCT 22.7* 27.1*  --  27.2*  --   PLT 156 165  --  202  --   HEPARINUNFRC 0.28* 0.21* 0.55 0.47 0.46  CREATININE 0.42*  --   --  0.42* 0.42*    Estimated Creatinine Clearance: 57.8 mL/min (A) (by C-G formula based on SCr of 0.42 mg/dL (L)).   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 Patient takes apixban 5mg  BID as an outpatient. Patient admitted on 4/13 unable to tolerate oral medications and was started on enoxaparin 40mg  Q24hr at that time. Patient admitted to ICU on 4/21 with worsening respiratory and subsequently intubated. Order to start heparin infusion given post intubation. Patient last received enoxaparin 40mg  at 2023 on 4/20. Heparin infusing at 1300 units/hr.   Goal of Therapy:  Heparin level 0.3-0.7 units/ml Monitor platelets by anticoagulation protocol: Yes   Plan:  04/27 @ 0330 HL 0.46 therapeutic. Will continue current rate at 1400 units/hr and will recheck HL w/ am labs. hgb low-stable will continue to monitor.  Tobie Lords, PharmD, BCPS Clinical Pharmacist 03/07/2019 6:01 AM

## 2019-03-07 NOTE — Progress Notes (Signed)
CRITICAL CARE NOTE        SUBJECTIVE FINDINGS & SIGNIFICANT EVENTS   Completed vanc/cefepime - 7 day therapy completed  Goals of care conversation today.   COVID-19 NEGATIVE:Acute COVID-19 infection ruled out by PCR.   SIGNIFICANT EVENTS 4/10 admitted for COPD exacerbation  4/12 discharged to home 4/13 re-admitted for COPD exacerbation, SVT 4/21 re-admitted to ICU for new RT sided pneumonia and severe resp failure, intubated #8 ETT 4/24 failed SAT/SBT 4/25 started on steroids for COPD and prevention of postt extubation stridor    PAST MEDICAL HISTORY   Past Medical History:  Diagnosis Date  . Acute deep vein thrombosis (DVT) of distal vein of left lower extremity ([place]) 10/03/2015  . Acute pulmonary embolism ([place]) 10/03/2015  . Anticoagulant long-term use   . Arthritis   . Arthritis   . CHF (congestive heart failure) ([place])   . Chronic anticoagulation 10/03/2015  . Collagen vascular disease ([place])   . Colostomy in place [facility]) 10/04/2015  . Deep venous thrombosis (HCC)    right lower extremity  . Dependent edema   . Diverticulosis of intestine with bleeding 04/02/2015  . Glenohumeral arthritis 06/28/2012  . Hammertoe 09/02/2012  . History of bilateral hip replacements 09/02/2012  . History of hysterectomy   . Hypertension   . Hypertension   . Hypokalemia   . Inability to ambulate due to hip 10/12/2015  . Mandibular dysfunction   . Onychomycosis 09/02/2012  . Osteoarthritis   . Polycythemia vera ([place])   . Presence of IVC filter 05/25/2015  . Stroke ([place]) 03/26/2015   cerebellar  . Urinary incontinence in female 09/02/2016     SURGICAL HISTORY   Past Surgical History:  Procedure Laterality Date  . ABDOMINAL HYSTERECTOMY    . BLEPHAROPLASTY Right 03/2017   right upper eyelid  .  CARDIAC CATHETERIZATION Right 04/03/2015   Procedure: CENTRAL LINE INSERTION;  Surgeon: Sherri Rad, MD;  Location: [facility] ORS;  Service: General;  Laterality: Right;  . COLECTOMY WITH COLOSTOMY CREATION/HARTMANN PROCEDURE N/A 04/03/2015   Procedure: COLECTOMY WITH COLOSTOMY CREATION/HARTMANN PROCEDURE;  Surgeon: Sherri Rad, MD;  Location: [facility] ORS;  Service: General;  Laterality: N/A;  . COLON SURGERY    . FOOT AMPUTATION     partial  . FOOT AMPUTATION Right   . IVC FILTER INSERTION    . JOINT REPLACEMENT     Left and Right Hip  . PERIPHERAL VASCULAR CATHETERIZATION N/A 04/02/2015   Procedure: Visceral Angiography;  Surgeon: Algernon Huxley, MD;  Location: [place] CV LAB;  Service: Cardiovascular;  Laterality: N/A;  . PERIPHERAL VASCULAR CATHETERIZATION N/A 04/02/2015   Procedure: Visceral Artery Intervention;  Surgeon: Algernon Huxley, MD;  Location: [place] CV LAB;  Service: Cardiovascular;  Laterality: N/A;  . PERIPHERAL VASCULAR CATHETERIZATION N/A 05/25/2015   Procedure: IVC Filter Insertion;  Surgeon: Katha Cabal, MD;  Location: [place] CV LAB;  Service: Cardiovascular;  Laterality: N/A;  . TOE AMPUTATION     right  . TOTAL HIP ARTHROPLASTY       FAMILY HISTORY   Family History  Problem Relation Age of Onset  . Brain cancer Mother   . Heart attack Father   . COPD Father   . Coronary artery disease Father   . Hypertension Other   . Arthritis-Osteo Other   . Diabetes Brother   . Arthritis Brother   . Osteosarcoma Son   . Bone cancer Son   . Arthritis Sister   . Arthritis Brother   .  Hypertension Brother   . Coronary artery disease Brother   . Malignant hyperthermia Brother      SOCIAL HISTORY   Social History   Tobacco Use  . Smoking status: Never Smoker  . Smokeless tobacco: Never Used  Substance Use Topics  . Alcohol use: No  . Drug use: No     MEDICATIONS   Current Medication:  Current Facility-Administered Medications:  .  0.9 %  sodium  chloride infusion, , Intravenous, PRN, Hillary Bow, MD, Stopped at 03/06/19 0016 .  0.9 %  sodium chloride infusion, 250 mL, Intravenous, Continuous, Flora Lipps, MD, Stopped at 03/02/19 1445 .  acetaminophen (TYLENOL) tablet 650 mg, 650 mg, Oral, Q6H PRN, 650 mg at 03/01/19 1200 **OR** acetaminophen (TYLENOL) suppository 650 mg, 650 mg, Rectal, Q6H PRN, Sudini, Srikar, MD .  adenosine (ADENOCARD) 12 MG/4ML injection 6 mg, 6 mg, Intravenous, Once, Awilda Bill, NP .  aspirin chewable tablet 81 mg, 81 mg, Per Tube, Daily, Flora Lipps, MD, 81 mg at 03/07/19 0927 .  budesonide (PULMICORT) nebulizer solution 0.5 mg, 0.5 mg, Nebulization, BID, Kasa, Kurian, MD, 0.5 mg at 03/07/19 0800 .  chlorhexidine gluconate (MEDLINE KIT) (PERIDEX) 0.12 % solution 15 mL, 15 mL, Mouth Rinse, BID, Kasa, Kurian, MD, 15 mL at 03/07/19 0732 .  dexmedetomidine (PRECEDEX) 400 MCG/100ML (4 mcg/mL) infusion, 0.4-1.2 mcg/kg/hr, Intravenous, Titrated, Epifanio Lesches, MD, Last Rate: 8.81 mL/hr at 03/07/19 0800, 0.4 mcg/kg/hr at 03/07/19 0800 .  feeding supplement (VITAL HIGH PROTEIN) liquid 1,000 mL, 1,000 mL, Per Tube, Continuous, Kasa, Kurian, MD, Last Rate: 50 mL/hr at 03/07/19 0732, 1,000 mL at 03/07/19 0732 .  fentaNYL (SUBLIMAZE) injection 25 mcg, 25 mcg, Intravenous, Q1H PRN, Flora Lipps, MD, 25 mcg at 03/07/19 0906 .  heparin ADULT infusion 100 units/mL (25000 units/256m sodium chloride 0.45%), 1,400 Units/hr, Intravenous, Continuous, WRocky Morel RPH, Last Rate: 14 mL/hr at 03/07/19 0800, 1,400 Units/hr at 03/07/19 0800 .  insulin aspart (novoLOG) injection 0-9 Units, 0-9 Units, Subcutaneous, Q4H, KFlora Lipps MD, 2 Units at 03/07/19 0732 .  ipratropium-albuterol (DUONEB) 0.5-2.5 (3) MG/3ML nebulizer solution 3 mL, 3 mL, Nebulization, Q4H PRN, SWilhelmina Mcardle MD .  ipratropium-albuterol (DUONEB) 0.5-2.5 (3) MG/3ML nebulizer solution 3 mL, 3 mL, Nebulization, Q6H, Kasa, Kurian, MD, 3 mL at 03/07/19 0800  .  lip balm (BLISTEX) ointment, , Topical, PRN, KDarel HongD, NP .  LORazepam (ATIVAN) injection 2 mg, 2 mg, Intravenous, Q3H PRN, KDarel HongD, NP, 2 mg at 03/07/19 0906 .  MEDLINE mouth rinse, 15 mL, Mouth Rinse, 10 times per day, KFlora Lipps MD, 15 mL at 03/07/19 0927 .  methylPREDNISolone sodium succinate (SOLU-MEDROL) 40 mg/mL injection 40 mg, 40 mg, Intravenous, Q12H, Kasa, Kurian, MD, 40 mg at 03/07/19 0927 .  metoprolol tartrate (LOPRESSOR) injection 2.5 mg, 2.5 mg, Intravenous, Q8H PRN, GTyler Pita MD .  multivitamin liquid 15 mL, 15 mL, Per Tube, Daily, KFlora Lipps MD, 15 mL at 03/07/19 0927 .  norepinephrine (LEVOPHED) '4mg'$  in 2550mpremix infusion, 0-40 mcg/min, Intravenous, Titrated, KaFlora LippsMD, Stopped at 03/05/19 0749 .  [DISCONTINUED] ondansetron (ZOFRAN) tablet 4 mg, 4 mg, Oral, Q6H PRN **OR** ondansetron (ZOFRAN) injection 4 mg, 4 mg, Intravenous, Q6H PRN, Sudini, Srikar, MD .  pantoprazole sodium (PROTONIX) 40 mg/20 mL oral suspension 40 mg, 40 mg, Per Tube, Daily, Kasa, Kurian, MD, 40 mg at 03/07/19 0927 .  polyethylene glycol (MIRALAX / GLYCOLAX) packet 17 g, 17 g, Oral, Daily PRN, SuHillary BowMD .  QUEtiapine (SEROQUEL) tablet 25 mg, 25 mg, Per Tube, BID, Awilda Bill, NP, 25 mg at 03/07/19 0927 .  senna-docusate (Senokot-S) tablet 1 tablet, 1 tablet, Oral, BID, Flora Lipps, MD, 1 tablet at 03/07/19 0927 .  sodium chloride flush (NS) 0.9 % injection 10-40 mL, 10-40 mL, Intracatheter, Q12H, Darel Hong D, NP, 10 mL at 03/07/19 0906 .  sodium chloride flush (NS) 0.9 % injection 10-40 mL, 10-40 mL, Intracatheter, PRN, Darel Hong D, NP .  sodium chloride flush (NS) 0.9 % injection 3 mL, 3 mL, Intravenous, Q12H, Sudini, Srikar, MD, 3 mL at 03/07/19 7096    ALLERGIES   Patient has no known allergies.    REVIEW OF SYSTEMS    Unable to obtain as patient is intubated on mechanical ventilation  PHYSICAL EXAMINATION   Vitals:    03/07/19 0700 03/07/19 0800  BP: (!) 114/58 110/64  Pulse: (!) 56 (!) 54  Resp: 17 14  Temp:  99.1 F (37.3 C)  SpO2: 100% 100%    GENERAL: No apparent distress stress -1 HEAD: Normocephalic, atraumatic.  EYES: Pupils equal, round, reactive to light.  No scleral icterus.  MOUTH: Moist mucosal membrane. NECK: Supple. No thyromegaly. No nodules. No JVD.  PULMONARY: Mild bilateral rhonchi CARDIOVASCULAR: S1 and S2. Regular rate and rhythm. No murmurs, rubs, or gallops.  GASTROINTESTINAL: Soft, nontender, non-distended. No masses. Positive bowel sounds. No hepatosplenomegaly.  MUSCULOSKELETAL: No swelling, clubbing, or edema.  NEUROLOGIC: Mild distress due to acute illness SKIN:intact,warm,dry   LABS AND IMAGING      LAB RESULTS: Recent Labs  Lab 03/05/19 0419 03/06/19 0450 03/07/19 0334  NA 138 133* 137  K 3.7 4.6 4.4  CL 106 102 106  CO2 '27 25 26  '$ BUN 19 22 25*  CREATININE 0.42* 0.42* 0.42*  GLUCOSE 150* 197* 209*   Recent Labs  Lab 03/05/19 1132 03/06/19 0450 03/07/19 0334  HGB 7.7* 7.7* 7.9*  HCT 27.1* 27.2* 28.0*  WBC 10.8* 14.7* 12.9*  PLT 165 202 239     IMAGING RESULTS: No results found.    ASSESSMENT AND PLAN    -Multidisciplinary rounds held today  Acute Hypoxic Respiratory Failure       -History of pulmonary venous thromboembolism and COPD  -Status post treatment for community-acquired pneumonia-completed 7-day course -continue Full MV support -continue Bronchodilator Therapy -Wean Fio2 and PEEP as tolerated -will perform SAT/SBT when respiratory parameters are met    NEUROLOGY - intubated and sedated - minimal sedation to achieve a RASS goal: -1 Wake up assessment pending   ID -continue IV abx as prescibed -follow up cultures  GI/Nutrition GI PROPHYLAXIS as indicated DIET-->TF's as tolerated Constipation protocol as indicated  ENDO - ICU hypoglycemic\Hyperglycemia protocol -check FSBS per protocol   ELECTROLYTES  -follow labs as needed -replace as needed -pharmacy consultation   DVT/GI PRX ordered -SCDs  TRANSFUSIONS AS NEEDED MONITOR FSBS ASSESS the need for LABS as needed   Critical care provider statement:    Critical care time (minutes):  36   Critical care time was exclusive of:  Separately billable procedures and treating other patients   Critical care was necessary to treat or prevent imminent or life-threatening deterioration of the following conditions:   Acute hypoxemic respiratory failure, multiple comorbid conditions   Critical care was time spent personally by me on the following activities:  Development of treatment plan with patient or surrogate, discussions with consultants, evaluation of patient's response to treatment, examination of patient, obtaining history from patient or  surrogate, ordering and performing treatments and interventions, ordering and review of laboratory studies and re-evaluation of patient's condition.  I assumed direction of critical care for this patient from another provider in my specialty: no    This document was prepared using Dragon voice recognition software and may include unintentional dictation errors.    Ottie Glazier, M.D.  Division of [place]

## 2019-03-07 NOTE — Progress Notes (Signed)
Pharmacy Electrolyte Monitoring Consult:  Pharmacy consulted to assist in monitoring and replacing electrolytes in this 85 y.o. female admitted on 02/17/2019 with Altered Mental Status and Shortness of Breath   Labs:  Sodium (mmol/L)  Date Value  03/07/2019 137  04/10/2015 137  07/08/2014 144   Potassium (mmol/L)  Date Value  03/07/2019 4.4  07/08/2014 3.7   Magnesium (mg/dL)  Date Value  03/07/2019 2.1  05/08/2014 2.6 (H)   Phosphorus (mg/dL)  Date Value  03/07/2019 3.0   Calcium (mg/dL)  Date Value  03/07/2019 7.9 (L)   Calcium, Total (mg/dL)  Date Value  07/08/2014 8.4 (L)   Albumin (g/dL)  Date Value  03/09/2019 3.1 (L)  07/08/2014 2.9 (L)     Assessment/Plan: No supplementation needed at this time.   Will replace for goal potassium ~ 4 and goal magnesium ~ 2.   Will obtain electrolytes with am labs.   Pharmacy will continue to monitor and adjust per consult.    Paticia Stack, PharmD Pharmacy Resident  03/07/2019 12:53 PM

## 2019-03-08 LAB — CBC
HCT: 31.1 % — ABNORMAL LOW (ref 36.0–46.0)
Hemoglobin: 8.6 g/dL — ABNORMAL LOW (ref 12.0–15.0)
MCH: 24.2 pg — ABNORMAL LOW (ref 26.0–34.0)
MCHC: 27.7 g/dL — ABNORMAL LOW (ref 30.0–36.0)
MCV: 87.6 fL (ref 80.0–100.0)
Platelets: 302 10*3/uL (ref 150–400)
RBC: 3.55 MIL/uL — ABNORMAL LOW (ref 3.87–5.11)
RDW: 29.1 % — ABNORMAL HIGH (ref 11.5–15.5)
WBC: 12.9 10*3/uL — ABNORMAL HIGH (ref 4.0–10.5)
nRBC: 5.1 % — ABNORMAL HIGH (ref 0.0–0.2)

## 2019-03-08 LAB — BASIC METABOLIC PANEL
Anion gap: 6 (ref 5–15)
BUN: 29 mg/dL — ABNORMAL HIGH (ref 8–23)
CO2: 27 mmol/L (ref 22–32)
Calcium: 8.5 mg/dL — ABNORMAL LOW (ref 8.9–10.3)
Chloride: 107 mmol/L (ref 98–111)
Creatinine, Ser: 0.44 mg/dL (ref 0.44–1.00)
GFR calc Af Amer: >60 mL/min (ref >60)
GFR calc non Af Amer: >60 mL/min (ref >60)
Glucose, Bld: 166 mg/dL — ABNORMAL HIGH (ref 70–99)
Potassium: 4.5 mmol/L (ref 3.5–5.1)
Sodium: 140 mmol/L (ref 135–145)

## 2019-03-08 LAB — GLUCOSE, CAPILLARY
Glucose-Capillary: 123 mg/dL — ABNORMAL HIGH (ref 70–99)
Glucose-Capillary: 143 mg/dL — ABNORMAL HIGH (ref 70–99)
Glucose-Capillary: 150 mg/dL — ABNORMAL HIGH (ref 70–99)
Glucose-Capillary: 164 mg/dL — ABNORMAL HIGH (ref 70–99)
Glucose-Capillary: 164 mg/dL — ABNORMAL HIGH (ref 70–99)
Glucose-Capillary: 175 mg/dL — ABNORMAL HIGH (ref 70–99)

## 2019-03-08 LAB — MAGNESIUM: Magnesium: 2.1 mg/dL (ref 1.7–2.4)

## 2019-03-08 LAB — HEPARIN LEVEL (UNFRACTIONATED): Heparin Unfractionated: 0.41 IU/mL (ref 0.30–0.70)

## 2019-03-08 NOTE — Progress Notes (Signed)
[facility] for Heparin Drip Management  Indication: History of DVT/PE; patient on apixaban as an outpatient   No Known Allergies  Patient Measurements: Height: 5' 2.01" (157.5 cm) Weight: 223 lb 15.8 oz (101.6 kg) IBW/kg (Calculated) : 50.12 Heparin Dosing Weight: 71 kg.   Vital Signs: Temp: 98.9 F (37.2 C) (04/28 0400) Temp Source: Axillary (04/28 0400) BP: 110/58 (04/28 0500) Pulse Rate: 62 (04/28 0500)  Labs: Recent Labs    03/05/19 1132  03/06/19 0450 03/07/19 0334 03/08/19 0425  HGB 7.7*  --  7.7* 7.9*  --   HCT 27.1*  --  27.2* 28.0*  --   PLT 165  --  202 239  --   HEPARINUNFRC 0.21*   < > 0.47 0.46 0.41  CREATININE  --   --  0.42* 0.42* 0.44   < > = values in this interval not displayed.    Estimated Creatinine Clearance: 58.4 mL/min (by C-G formula based on SCr of 0.44 mg/dL).   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 Patient takes apixban 5mg  BID as an outpatient. Patient admitted on 4/13 unable to tolerate oral medications and was started on enoxaparin 40mg  Q24hr at that time. Patient admitted to ICU on 4/21 with worsening respiratory and subsequently intubated. Order to start heparin infusion given post intubation. Patient last received enoxaparin 40mg  at 2023 on 4/20. Heparin infusing at 1400 units/hr.   Goal of Therapy:  Heparin level 0.3-0.7 units/ml Monitor platelets by anticoagulation protocol: Yes   Plan:  04/28 @ 0425 HL 0.41. Level remains therapeutic. Will continue current rate at 1400 units/hr.  Hgb:6.4>> 7.7>> 7.9>> 8.6. Continue to check HL and CBCs w/ am labs. hgb low-stable will continue to monitor.  Pernell Dupre, PharmD, BCPS Clinical Pharmacist 03/08/2019 5:26 AM

## 2019-03-08 NOTE — Progress Notes (Addendum)
Daily Progress Note   Patient Name: Yolanda Wells       Date: 03/08/2019 DOB: 10/21/1932  Age: 85 y.o. MRN#: [id number] Attending Physician: Epifanio Lesches, MD Primary Care Physician: [name], MD Admit Date: 02/27/2019  Reason for Consultation/Follow-up: Establishing goals of care  Subjective: Patient resting in bed on ventilator. She is able to shake and nod her head this morning. Explained that we have been speaking with her granddaughter. Nods that she is her HPOA. She nods that she has a living will. She nods that it says she does not want to live in a vegetative state on machines. She shakes her head no, that she does not want to be extubated to comfort at this time. She shakes her head no that she does not want a tracheostomy. She nods that when needed, she wants the tube removed and to be supported either with continued care if she does well, or comfort if she does not.   Spoke with granddaughter Yolanda Wells as planned. She states yesterday she was in tears because so many people were calling her not just to update her, but also to ask her when she planned to make a decision about the ventilator. She states she is feeling a lot of pressure from the staff in ICU including nurses, and she has already been advised she does not have to make a decision until Friday. She states her grandmother's birthday is Thursday and she will not make a decision until Friday. She states she is getting conflicting information on how Yolanda Wells is doing with the ventilator and SBT trials. She states she knows her grandmother is not passing the SBT's overall, but some are saying she never breathed herself, others say she was able to for 15 minutes to an hour.   She is leaning very likely toward comfort care,  but needs some time to settle with this plan. She does not want me to have a [place] meeting with her and her uncle today, as she says she has had to discuss the ventilator so much with several calls per day, she does not want to discuss it today. She requests no further questions about ventilator planning from nursing staff. She does want to be called if her grandmother's status changes, or if she is uncomfortable and needs sedation.  She states she does want palliative to continue following and calling her.   Given her frustration with numerous calls, at this time if possible, would recommend 1 person, preferably CCM proivider (aside from palliative) calling her per day with an update/questions.    Length of Stay: 15  Current Medications: Scheduled Meds:   adenosine (ADENOCARD) IV  6 mg Intravenous Once   aspirin  81 mg Per Tube Daily   chlorhexidine gluconate (MEDLINE KIT)  15 mL Mouth Rinse BID   insulin aspart  0-9 Units Subcutaneous Q4H   ipratropium-albuterol  3 mL Nebulization Q6H   mouth rinse  15 mL Mouth Rinse 10 times per day   multivitamin  15 mL Per Tube Daily   pantoprazole sodium  40 mg Per Tube Daily   senna-docusate  1 tablet Oral BID   sodium chloride flush  10-40 mL Intracatheter Q12H   sodium chloride flush  3 mL Intravenous Q12H    Continuous Infusions:  sodium chloride Stopped (03/06/19 0016)   sodium chloride Stopped (03/02/19 1445)   dexmedetomidine (PRECEDEX) IV infusion 0.4 mcg/kg/hr (03/08/19 0900)   feeding supplement (VITAL HIGH PROTEIN) 1,000 mL (03/08/19 0418)   heparin 1,400 Units/hr (03/08/19 0900)    PRN Meds: sodium chloride, acetaminophen **OR** acetaminophen, fentaNYL (SUBLIMAZE) injection, ipratropium-albuterol, lip balm, metoprolol tartrate, [DISCONTINUED] ondansetron **OR** ondansetron (ZOFRAN) IV, polyethylene glycol, sodium chloride flush  General: Intubated Lungs: Intubated  Neurologic: intubated         Vital Signs: BP  (!) 102/57    Pulse (!) 58    Temp 98.1 F (36.7 C) (Oral)    Resp 17    Ht 5' 2.01" (1.575 m)    Wt 101.6 kg    SpO2 100%    BMI 40.96 kg/m  SpO2: SpO2: 100 % O2 Device: O2 Device: Ventilator O2 Flow Rate: O2 Flow Rate (L/min): 2 L/min  Intake/output summary:   Intake/Output Summary (Last 24 hours) at 03/08/2019 1029 Last data filed at 03/08/2019 0900 Gross per 24 hour  Intake 2524.39 ml  Output 925 ml  Net 1599.39 ml   LBM: Last BM Date: 03/08/19 Baseline Weight: Weight: 90 kg Most recent weight: Weight: 101.6 kg       Palliative Assessment/Data: INTUBATED     Flowsheet Rows     Most Recent Value  Intake Tab  Referral Department  Hospitalist  Unit at Time of Referral  Med/Surg Unit  Palliative Care Primary Diagnosis  Pulmonary  Date Notified  03/06/2019  Palliative Care Type  Return patient Palliative Care  Reason for referral  Clarify Goals of Care  Date of Admission  02/23/2019  Date first seen by Palliative Care  02/22/19  # of days Palliative referral response time  1 Day(s)  # of days IP prior to Palliative referral  0  Clinical Assessment  Palliative Performance Scale Score  30%  Pain Max last 24 hours  Not able to report  Pain Min Last 24 hours  Not able to report  Dyspnea Max Last 24 Hours  Not able to report  Dyspnea Min Last 24 hours  Not able to report  Psychosocial & Spiritual Assessment  Palliative Care Outcomes      Patient Active Problem List   Diagnosis Date Noted   Acute respiratory failure with hypoxia and hypercapnia (HCC)    Acute on chronic respiratory failure ([place]) 03/09/2019   Decreased appetite 02/02/2019   Poor appetite 12/24/2018   Unsteady 12/24/2018   Palliative care encounter 12/24/2018  SOB (shortness of breath) 10/28/2018   Shortness of breath 09/27/2018   COPD exacerbation ([place]) 09/22/2018   Sepsis ([place]) 09/22/2018   Lower GI bleed 09/09/2018   AMS (altered mental status) 08/30/2018   Pressure injury of skin  08/13/2018   B12 deficiency 08/04/2018   Anemia 06/07/2018   Secondary myelofibrosis ([place]) 02/17/2018   Goals of care, counseling/discussion 12/23/2017   Palliative care by specialist    DNR (do not resuscitate) discussion    Weakness generalized    Chronic diastolic CHF (congestive heart failure) ([place]) 07/28/2017   Vascular dementia without behavioral disturbance ([place]) 07/08/2017   Influenza with respiratory manifestation 12/01/2016   Respiratory distress 11/23/2016   Urinary incontinence in female 09/02/2016   Vision loss of right eye 08/26/2016   Blindness of right eye 08/26/2016   Inability to ambulate due to hip 10/12/2015   Colostomy in place [facility]) 10/04/2015   Long term current use of anticoagulant therapy 10/03/2015   Leukocytosis 09/25/2015   CAP (community acquired pneumonia) 09/24/2015   COPD (chronic obstructive pulmonary disease) ([place]) 09/24/2015   UTI (urinary tract infection) 07/16/2015   Abdominal wall cellulitis 07/15/2015   DVT (deep venous thrombosis) ([place]) 07/15/2015   HTN (hypertension) 07/15/2015   Polycythemia vera ([place]) 07/15/2015   Arthritis 07/15/2015   Pulmonary emboli ([place]) 05/25/2015   Diverticulosis of colon with hemorrhage 04/02/2015   Diverticulosis of intestine with bleeding 04/02/2015   Fall 01/10/2015   Osteoarthritis 01/10/2015   Hammertoe 09/02/2012   S/P transmetatarsal amputation of foot ([place]) 09/02/2012   Onychomycosis 09/02/2012   Other specified dermatoses 09/02/2012   S/P hip replacement 08/23/2012   Glenohumeral arthritis 06/28/2012    Palliative Care Assessment & Plan   Patient Profile: Per [name], NP: 85 y.o. female  with past medical history of  heart failure, long-term anticoagulation use, polycythemia vera, collagen vascular disease cerebellar stroke, history of PE with IVC filter, history of DVT left lower extremity / right lower extremity, bilateral hip replacements, osteoarthritis,  diverticulosis with intestinal bleeding, colostomy 2016, hypertension, arthritis, partial right foot amputation admitted on 03/07/2019 with acute on chronic hypoxic/hypercarbic respiratory failure likely secondary to acute on chronic diastolic heart failure.  PMT consulted for goals of care, 8 admissions and 3 ED visits in 6 months  Recommendations/Plan:  Full Code  Full aggressive medical interventions  Family in discussion about how to proceed with care in future, trach, peg, limitations to intubation period.   PMT will continue to follow and support as able  Leaning toward comfort care. Will not make a decision until Friday, stating she was advised she has until then.   Recommend 1 person (aside from palliative) per day , preferably CCM MD, to call with updates/questions if possible.    Goals of Care and Additional Recommendations:  Limitations on Scope of Treatment: Full Scope Treatment  Code Status:    Code Status Orders  (From admission, onward)         Start     Ordered   03/04/2019 1349  Full code  Continuous     03/02/2019 1349        Code Status History    Date Active Date Inactive Code Status Order ID Comments User Context   02/18/2019 1813 02/20/2019 2309 Full Code [id number]  Otila Back, MD Inpatient   11/08/2018 1532 11/10/2018 2032 DNR [id number]  Dustin Flock, MD Inpatient   10/28/2018 1334 10/31/2018 1621 DNR [id number]  Dustin Flock, MD Inpatient   09/27/2018 1901 09/30/2018 1545 DNR [id number]  Loletha Grayer, MD ED   09/22/2018 0133 09/25/2018 2201 Full Code [id number]  Amelia Jo, MD Inpatient   09/09/2018 1240 09/12/2018 2008 Full Code [id number]  Loletha Grayer, MD ED   08/30/2018 2018 09/01/2018 2004 DNR [id number]  Nicholes Mango, MD ED   08/30/2018 1903 08/30/2018 2018 Full Code [id number]  Nicholes Mango, MD ED   08/13/2018 1038 08/15/2018 1836 Full Code [id number]  Loletha Grayer, MD Inpatient   08/12/2018 2001 08/13/2018 1038 DNR [id number]  Gorden Harms, MD ED   08/12/2018 2001 08/12/2018 2001 Full Code [id number]  Salary, Avel Peace, MD ED   06/15/2018 1635 06/18/2018 2009 Full Code [id number]  [name], MD Inpatient   02/24/2018 0347 02/27/2018 0027 Full Code [id number]  Amelia Jo, MD Inpatient   10/15/2017 1141 10/16/2017 2204 Full Code [id number]  Knox Royalty, NP Inpatient   10/13/2017 2246 10/15/2017 1141 DNR [id number]  Lance Coon, MD ED   10/06/2017 1454 10/09/2017 2058 DNR [id number]  Nicholes Mango, MD Inpatient   10/06/2017 1111 10/06/2017 1453 Full Code [id number]  Nicholes Mango, MD Inpatient   05/27/2017 1954 05/28/2017 2013 Full Code [id number]  Vaughan Basta, MD Inpatient   05/23/2017 2322 05/26/2017 1614 Full Code [id number]  Lance Coon, MD Inpatient   04/10/2017 0325 04/11/2017 1916 Full Code [id number]  Harrie Foreman, MD Inpatient   11/23/2016 0629 12/01/2016 1935 Full Code [id number]  Harrie Foreman, MD Inpatient   08/26/2016 0445 08/26/2016 1923 Full Code [id number]  Saundra Shelling, MD Inpatient   09/22/2015 1755 09/25/2015 1844 Full Code [id number]  Demetrios Loll, MD ED   09/15/2015 0341 09/17/2015 1741 Full Code [id number]  Harrie Foreman, MD Inpatient   07/16/2015 0151 07/20/2015 2334 Full Code [id number]  Lance Coon, MD Inpatient   05/25/2015 0834 05/29/2015 1616 Full Code [id number]  Harrie Foreman, MD Inpatient   04/02/2015 0606 04/08/2015 1651 Full Code [id number]  Harrie Foreman, MD Inpatient      Prognosis:   POOR   Discharge Planning:  To Be Determined   The above conversation was completed via telephone due to the visitor restrictions during the COVID-19 pandemic. Thorough chart review and discussion with necessary members of the care team was completed as part of assessment.   Thank you for allowing the Palliative Medicine Team to assist in the care of this patient.  9:40-11:00 Total Time: 80 min.

## 2019-03-08 NOTE — Progress Notes (Signed)
[facility] [place] NAME: Yolanda Wells    MR#:  [id number]  DATE OF BIRTH:  1931/12/19  SUBJECTIVE: Patient is on vent support, because of COPD exacerbation, pneumonia.    CHIEF COMPLAINT:   Chief Complaint  Patient presents with  . Altered Mental Status  . Shortness of Breath  Patient continues to be on mechanical ventilator   REVIEW OF SYSTEMS:    DRUG ALLERGIES:  No Known Allergies  VITALS:  Blood pressure 106/60, pulse (!) 58, temperature 98.1 F (36.7 C), temperature source Oral, resp. rate 14, height 5' 2.01" (1.575 m), weight 101.6 kg, SpO2 100 %.  PHYSICAL EXAMINATION:   Physical Exam  GENERAL:  85 y.o.-year-old patient lying in the bed, obese. Critically ill EYES: Pupils equal, round, reactive to light and accommodation. No scleral icterus. Extraocular muscles intact.  HEENT: Head atraumatic, normocephalic. Oropharynx and nasopharynx clear.  NECK:  Supple, no jugular venous distention. No thyroid enlargement, no tenderness.  LUNGS: Decrease breath sounds bilateral, shallow, rapid CARDIOVASCULAR: S1, S2 normal. No murmurs, rubs, or gallops.  ABDOMEN: Soft, nontender, nondistended. Bowel sounds present. No organomegaly or mass.  EXTREMITIES: No cyanosis, clubbing. LE edema PSYCHIATRIC: could not be assessed  LABORATORY PANEL:   CBC Recent Labs  Lab 03/08/19 0425  WBC 12.9*  HGB 8.6*  HCT 31.1*  PLT 302   ------------------------------------------------------------------------------------------------------------------ Chemistries  Recent Labs  Lab 03/08/19 0425  NA 140  K 4.5  CL 107  CO2 27  GLUCOSE 166*  BUN 29*  CREATININE 0.44  CALCIUM 8.5*  MG 2.1   ------------------------------------------------------------------------------------------------------------------  Cardiac Enzymes No results for input(s): TROPONINI in the last 168  hours. ------------------------------------------------------------------------------------------------------------------  RADIOLOGY:  No results found.   ASSESSMENT AND PLAN:   * Acute on chronic hypoxic hypercarbic respiratory failure failure, COPD exacerbation,, pneumonia, continue IV antibiotics vancomycin, cefepime., bronchodilators, wean as tolerated.  Patient on low-dose Solu-Medrol.  Slow weaning as tolerated, patient had a cuff leak yesterday.  Pulm HTN, COPD and now PNA On mechanical ventilator Intensivist follow-up Continue vent bundle Weaning trials if parameters met  * RLL HCAP On  IV vancomycin and cefepime Duration of antibiotics as per ICU attending   *Hypotension Better. * Acute metabolic encephalopathy secondary to hypercarbia.  Has baseline mild cognitive impairment. On ventilator  *atrial  fibrillation with RVR, SVT,, patient is on heparin drip.  *COPD exacerbation improving slowly Continue nebulization therapy IV steroid therapy  *DVT prophylaxis On heparin drip for anticoagulation Dementia, agitation, continue Seroquel, Ativan. Palliative care consulted and following.  All the records are reviewed and case discussed with Care Management/Social Worker Management plans discussed with the patient, family and they are in agreement.  CODE STATUS: FULL CODE  TOTAL TIME TAKING CARE OF THIS PATIENT: 35 minutes.   POSSIBLE D/C IN 1-2 DAYS, DEPENDING ON CLINICAL CONDITION.  Epifanio Lesches M.D on 03/08/2019 at 10:16 AM  Between 7am to 6pm - Pager - [phone]  After 6pm go to [email or web address] - password [email or web address] [facility]  Office  [phone]  CC: Primary care physician; [name], MD  Note: This dictation was prepared with Dragon dictation along with smaller phrase technology. Any transcriptional errors that result from this process are unintentional.

## 2019-03-08 NOTE — Progress Notes (Signed)
Pharmacy Electrolyte Monitoring Consult:  Pharmacy consulted to assist in monitoring and replacing electrolytes in this 85 y.o. female admitted on 03/04/2019 with Altered Mental Status and Shortness of Breath   Labs:  Sodium (mmol/L)  Date Value  03/08/2019 140  04/10/2015 137  07/08/2014 144   Potassium (mmol/L)  Date Value  03/08/2019 4.5  07/08/2014 3.7   Magnesium (mg/dL)  Date Value  03/08/2019 2.1  05/08/2014 2.6 (H)   Phosphorus (mg/dL)  Date Value  03/07/2019 3.0   Calcium (mg/dL)  Date Value  03/08/2019 8.5 (L)   Calcium, Total (mg/dL)  Date Value  07/08/2014 8.4 (L)   Albumin (g/dL)  Date Value  02/16/2019 3.1 (L)  07/08/2014 2.9 (L)     Assessment/Plan: No supplementation needed at this time.   Will replace for goal potassium ~ 4 and goal magnesium ~ 2.   Will obtain electrolytes with am labs.   Pharmacy will continue to monitor and adjust per consult.    Paticia Stack, PharmD Pharmacy Resident  03/08/2019 1:08 PM

## 2019-03-08 NOTE — Progress Notes (Signed)
CRITICAL CARE NOTE      CHIEF COMPLAINT:   Acute hypoxemic respiratory failure   SUBJECTIVE FINDINGS & SIGNIFICANT EVENTS   Minimally responsive on MV, plan for [place] discourse with family and PALS today.  Stopping IV steroids and pulmicort today. Stopping ativan and seroquel to help with liberation from MV.  Tube feeds 50cc/h  PAST MEDICAL HISTORY   Past Medical History:  Diagnosis Date  . Acute deep vein thrombosis (DVT) of distal vein of left lower extremity ([place]) 10/03/2015  . Acute pulmonary embolism ([place]) 10/03/2015  . Anticoagulant long-term use   . Arthritis   . Arthritis   . CHF (congestive heart failure) ([place])   . Chronic anticoagulation 10/03/2015  . Collagen vascular disease ([place])   . Colostomy in place [facility]) 10/04/2015  . Deep venous thrombosis (HCC)    right lower extremity  . Dependent edema   . Diverticulosis of intestine with bleeding 04/02/2015  . Glenohumeral arthritis 06/28/2012  . Hammertoe 09/02/2012  . History of bilateral hip replacements 09/02/2012  . History of hysterectomy   . Hypertension   . Hypertension   . Hypokalemia   . Inability to ambulate due to hip 10/12/2015  . Mandibular dysfunction   . Onychomycosis 09/02/2012  . Osteoarthritis   . Polycythemia vera ([place])   . Presence of IVC filter 05/25/2015  . 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 Urinary incontinence in female 09/02/2016     SURGICAL HISTORY   Past Surgical History:  Procedure Laterality Date  . ABDOMINAL HYSTERECTOMY    . BLEPHAROPLASTY Right 03/2017   right upper eyelid  . CARDIAC CATHETERIZATION Right 04/03/2015   Procedure: CENTRAL LINE INSERTION;  Surgeon: Sherri Rad, MD;  Location: [facility] ORS;  Service: General;  Laterality: Right;  . COLECTOMY WITH COLOSTOMY CREATION/HARTMANN PROCEDURE  N/A 04/03/2015   Procedure: COLECTOMY WITH COLOSTOMY CREATION/HARTMANN PROCEDURE;  Surgeon: Sherri Rad, MD;  Location: [facility] ORS;  Service: General;  Laterality: N/A;  . COLON SURGERY    . FOOT AMPUTATION     partial  . FOOT AMPUTATION Right   . IVC FILTER INSERTION    . JOINT REPLACEMENT     Left and Right Hip  . PERIPHERAL VASCULAR CATHETERIZATION N/A 04/02/2015   Procedure: Visceral Angiography;  Surgeon: Algernon Huxley, MD;  Location: [place] CV LAB;  Service: Cardiovascular;  Laterality: N/A;  . PERIPHERAL VASCULAR CATHETERIZATION N/A 04/02/2015   Procedure: Visceral Artery Intervention;  Surgeon: Algernon Huxley, MD;  Location: [place] [facility];  Service: Cardiovascular;  Laterality: N/A;  . PERIPHERAL VASCULAR CATHETERIZATION N/A 05/25/2015   Procedure: IVC Filter Insertion;  Surgeon: Katha Cabal, MD;  Location: [place] [facility];  Service: Cardiovascular;  Laterality: N/A;  . TOE AMPUTATION     right  . TOTAL HIP ARTHROPLASTY       FAMILY HISTORY   Family History  Problem Relation Age of Onset  . Brain cancer Mother   . Heart attack Father   . COPD Father   . Coronary artery disease Father   . Hypertension Other   . Arthritis-Osteo Other   . Diabetes Brother   . Arthritis Brother   . Osteosarcoma Son   . Bone cancer Son   . Arthritis Sister   . Arthritis Brother   . Hypertension Brother   . Coronary artery disease Brother   . Malignant hyperthermia Brother      SOCIAL HISTORY   Social History   Tobacco Use  .  Smoking status: Never Smoker  . Smokeless tobacco: Never Used  Substance Use Topics  . Alcohol use: No  . Drug use: No     MEDICATIONS   Current Medication:  Current Facility-Administered Medications:  .  0.9 %  sodium chloride infusion, , Intravenous, PRN, Hillary Bow, MD, Stopped at 03/06/19 0016 .  0.9 %  sodium chloride infusion, 250 mL, Intravenous, Continuous, Flora Lipps, MD, Stopped at 03/02/19 1445 .  acetaminophen  (TYLENOL) tablet 650 mg, 650 mg, Oral, Q6H PRN, 650 mg at 03/01/19 1200 **OR** acetaminophen (TYLENOL) suppository 650 mg, 650 mg, Rectal, Q6H PRN, Sudini, Srikar, MD .  adenosine (ADENOCARD) 12 MG/4ML injection 6 mg, 6 mg, Intravenous, Once, Awilda Bill, NP .  aspirin chewable tablet 81 mg, 81 mg, Per Tube, Daily, Kasa, Kurian, MD, 81 mg at 03/08/19 0900 .  budesonide (PULMICORT) nebulizer solution 0.5 mg, 0.5 mg, Nebulization, BID, Kasa, Kurian, MD, 0.5 mg at 03/08/19 0827 .  chlorhexidine gluconate (MEDLINE KIT) (PERIDEX) 0.12 % solution 15 mL, 15 mL, Mouth Rinse, BID, Kasa, Kurian, MD, 15 mL at 03/08/19 0821 .  dexmedetomidine (PRECEDEX) 400 MCG/100ML (4 mcg/mL) infusion, 0.4-1.2 mcg/kg/hr, Intravenous, Titrated, Epifanio Lesches, MD, Last Rate: 8.81 mL/hr at 03/08/19 0900, 0.4 mcg/kg/hr at 03/08/19 0900 .  feeding supplement (VITAL HIGH PROTEIN) liquid 1,000 mL, 1,000 mL, Per Tube, Continuous, Kasa, Kurian, MD, Last Rate: 50 mL/hr at 03/08/19 0418, 1,000 mL at 03/08/19 0418 .  fentaNYL (SUBLIMAZE) injection 25 mcg, 25 mcg, Intravenous, Q1H PRN, Flora Lipps, MD, 25 mcg at 03/07/19 0906 .  heparin ADULT infusion 100 units/mL (25000 units/214m sodium chloride 0.45%), 1,400 Units/hr, Intravenous, Continuous, WRocky Morel RPH, Last Rate: 14 mL/hr at 03/08/19 0900, 1,400 Units/hr at 03/08/19 0900 .  insulin aspart (novoLOG) injection 0-9 Units, 0-9 Units, Subcutaneous, Q4H, KFlora Lipps MD, 2 Units at 03/08/19 [id number].  ipratropium-albuterol (DUONEB) 0.5-2.5 (3) MG/3ML nebulizer solution 3 mL, 3 mL, Nebulization, Q4H PRN, SWilhelmina Mcardle MD .  ipratropium-albuterol (DUONEB) 0.5-2.5 (3) MG/3ML nebulizer solution 3 mL, 3 mL, Nebulization, Q6H, Kasa, Kurian, MD, 3 mL at 03/08/19 0827 .  lip balm (BLISTEX) ointment, , Topical, PRN, KDarel HongD, NP .  LORazepam (ATIVAN) injection 2 mg, 2 mg, Intravenous, Q3H PRN, KDarel HongD, NP, 2 mg at 03/08/19 0119 .  MEDLINE mouth rinse, 15 mL,  Mouth Rinse, 10 times per day, KFlora Lipps MD, 15 mL at 03/08/19 0608 .  methylPREDNISolone sodium succinate (SOLU-MEDROL) 40 mg/mL injection 40 mg, 40 mg, Intravenous, Q12H, Kasa, Kurian, MD, 40 mg at 03/08/19 0900 .  metoprolol tartrate (LOPRESSOR) injection 2.5 mg, 2.5 mg, Intravenous, Q8H PRN, GTyler Pita MD .  multivitamin liquid 15 mL, 15 mL, Per Tube, Daily, Kasa, Kurian, MD, 15 mL at 03/08/19 0900 .  norepinephrine (LEVOPHED) '4mg'$  in 2519mpremix infusion, 0-40 mcg/min, Intravenous, Titrated, KaFlora LippsMD, Stopped at 03/05/19 0749 .  [DISCONTINUED] ondansetron (ZOFRAN) tablet 4 mg, 4 mg, Oral, Q6H PRN **OR** ondansetron (ZOFRAN) injection 4 mg, 4 mg, Intravenous, Q6H PRN, Sudini, Srikar, MD .  pantoprazole sodium (PROTONIX) 40 mg/20 mL oral suspension 40 mg, 40 mg, Per Tube, Daily, Kasa, Kurian, MD, 40 mg at 03/08/19 0900 .  polyethylene glycol (MIRALAX / GLYCOLAX) packet 17 g, 17 g, Oral, Daily PRN, Sudini, Srikar, MD .  QUEtiapine (SEROQUEL) tablet 25 mg, 25 mg, Per Tube, BID, BlAwilda BillNP, 25 mg at 03/08/19 0900 .  senna-docusate (Senokot-S) tablet 1 tablet, 1 tablet, Oral, BID, Kasa,  Maretta Bees, MD, 1 tablet at 03/08/19 0900 .  sodium chloride flush (NS) 0.9 % injection 10-40 mL, 10-40 mL, Intracatheter, Q12H, Darel Hong D, NP, 10 mL at 03/07/19 2208 .  sodium chloride flush (NS) 0.9 % injection 10-40 mL, 10-40 mL, Intracatheter, PRN, Darel Hong D, NP .  sodium chloride flush (NS) 0.9 % injection 3 mL, 3 mL, Intravenous, Q12H, Sudini, Srikar, MD, 3 mL at 03/07/19 2209    ALLERGIES   Patient has no known allergies.    REVIEW OF SYSTEMS    Unable to conduct due to mechanical ventilation and sedation  PHYSICAL EXAMINATION   Vitals:   03/08/19 0830 03/08/19 0900  BP:  106/60  Pulse:  (!) 58  Resp:  14  Temp:    SpO2: 100% 100%    GENERAL:  chronically ill-appearing HEAD: Normocephalic, atraumatic.  EYES: Pupils equal, round, reactive to  light.  No scleral icterus.  MOUTH: Moist mucosal membrane. NECK: Supple. No thyromegaly. No nodules. No JVD.  PULMONARY: Mild bibasilar crackles CARDIOVASCULAR: S1 and S2. Regular rate and rhythm. No murmurs, rubs, or gallops.  GASTROINTESTINAL: Soft, nontender, non-distended. No masses. Positive bowel sounds. No hepatosplenomegaly.  MUSCULOSKELETAL: No swelling, clubbing, or edema.  NEUROLOGIC: Dated on mechanical ventilation SKIN:intact,warm,dry   LABS AND IMAGING    LAB RESULTS: Recent Labs  Lab 03/06/19 0450 03/07/19 0334 03/08/19 0425  NA 133* 137 140  K 4.6 4.4 4.5  CL 102 106 107  CO2 '25 26 27  '$ BUN 22 25* 29*  CREATININE 0.42* 0.42* 0.44  GLUCOSE 197* 209* 166*   Recent Labs  Lab 03/06/19 0450 03/07/19 0334 03/08/19 0425  HGB 7.7* 7.9* 8.6*  HCT 27.2* 28.0* 31.1*  WBC 14.7* 12.9* 12.9*  PLT 202 239 302     IMAGING RESULTS: No results found.    ASSESSMENT AND PLAN   Acute Hypoxic Respiratory Failure       -History of pulmonary venous thromboembolism and COPD  -Status post treatment for community-acquired pneumonia-completed 7-day course -continue Full MV support -Appreciate palliative care input-goal for terminal weaning on Friday as per family    NEUROLOGY - intubated and sedated - minimal sedation to achieve a RASS goal: -1 Wake up assessment pending   ID -continue IV abx as prescibed -follow up cultures  GI/Nutrition GI PROPHYLAXIS as indicated DIET-->TF's as tolerated Constipation protocol as indicated  ENDO - ICU hypoglycemic\Hyperglycemia protocol -check FSBS per protocol   ELECTROLYTES -follow labs as needed -replace as needed -pharmacy consultation   DVT/GI PRX ordered -SCDs  TRANSFUSIONS AS NEEDED MONITOR FSBS ASSESS the need for LABS as needed   Critical care provider statement:   Critical care time (minutes): 35  Critical care time was exclusive of: Separately billable procedures and treating  other patients  Critical care was necessary to treat or prevent imminent or life-threatening deterioration of the following conditions:  Acute hypoxemic respiratory failure, multiple comorbid conditions  Critical care was time spent personally by me on the following activities: Development of treatment plan with patient or surrogate, discussions with consultants, evaluation of patient's response to treatment, examination of patient, obtaining history from patient or surrogate, ordering and performing treatments and interventions, ordering and review of laboratory studies and re-evaluation of patient's condition.  I assumed direction of critical care for this patient from another provider in my specialty: no   This document was prepared using Dragon voice recognition software and may include unintentional dictation errors.    Ottie Glazier, M.D.  Division of Pulmonary &  [place]

## 2019-03-08 NOTE — TOC Progression Note (Signed)
Transition of Care [facility]) - Progression Note    Patient Details  Name: Donella Pascarella MRN: [id number] Date of Birth: 1932-04-29  Transition of Care [facility]) CM/SW Contact  Elza Rafter, RN Phone Number: 03/08/2019, 4:08 PM  Clinical Narrative:  Granddaughter not ready to make a decision about comfort care and ventilator until Friday per palliative.       Expected Discharge Plan: [place] Barriers to Discharge: Continued Medical Work up  Expected Discharge Plan and Services Expected Discharge Plan: [place] arrangements for the past 2 months: [place] Agency: [place]         Social Determinants of Health (SDOH) Interventions    Readmission Risk Interventions Readmission Risk Prevention Plan 02/23/2019 09/25/2018 09/01/2018  Transportation Screening Complete Complete Complete  Medication Review [employer]) Complete Complete Complete  PCP or Specialist appointment within 3-5 days of discharge - Complete Complete  HRI or Home Care Consult Complete Complete Complete  SW Recovery Care/Counseling Consult - Complete Complete  Palliative Care Screening Complete Complete Complete  Medication Reconcilation (Pharmacy) - Complete Complete  Skilled Nursing Facility Not Applicable Complete Not Complete  Some recent data might be hidden

## 2019-03-09 ENCOUNTER — Inpatient Hospital Stay: Payer: Medicare Other

## 2019-03-09 LAB — GLUCOSE, CAPILLARY
Glucose-Capillary: 110 mg/dL — ABNORMAL HIGH (ref 70–99)
Glucose-Capillary: 145 mg/dL — ABNORMAL HIGH (ref 70–99)
Glucose-Capillary: 151 mg/dL — ABNORMAL HIGH (ref 70–99)
Glucose-Capillary: 154 mg/dL — ABNORMAL HIGH (ref 70–99)
Glucose-Capillary: 155 mg/dL — ABNORMAL HIGH (ref 70–99)
Glucose-Capillary: 160 mg/dL — ABNORMAL HIGH (ref 70–99)

## 2019-03-09 LAB — BASIC METABOLIC PANEL
Anion gap: 7 (ref 5–15)
BUN: 29 mg/dL — ABNORMAL HIGH (ref 8–23)
CO2: 27 mmol/L (ref 22–32)
Calcium: 8.7 mg/dL — ABNORMAL LOW (ref 8.9–10.3)
Chloride: 108 mmol/L (ref 98–111)
Creatinine, Ser: 0.46 mg/dL (ref 0.44–1.00)
GFR calc Af Amer: >60 mL/min (ref >60)
GFR calc non Af Amer: >60 mL/min (ref >60)
Glucose, Bld: 145 mg/dL — ABNORMAL HIGH (ref 70–99)
Potassium: 3.9 mmol/L (ref 3.5–5.1)
Sodium: 142 mmol/L (ref 135–145)

## 2019-03-09 LAB — CBC
HCT: 31 % — ABNORMAL LOW (ref 36.0–46.0)
Hemoglobin: 8.6 g/dL — ABNORMAL LOW (ref 12.0–15.0)
MCH: 24.4 pg — ABNORMAL LOW (ref 26.0–34.0)
MCHC: 27.7 g/dL — ABNORMAL LOW (ref 30.0–36.0)
MCV: 88.1 fL (ref 80.0–100.0)
Platelets: 348 10*3/uL (ref 150–400)
RBC: 3.52 MIL/uL — ABNORMAL LOW (ref 3.87–5.11)
RDW: 28.3 % — ABNORMAL HIGH (ref 11.5–15.5)
WBC: 13.7 10*3/uL — ABNORMAL HIGH (ref 4.0–10.5)
nRBC: 9.2 % — ABNORMAL HIGH (ref 0.0–0.2)

## 2019-03-09 LAB — HEPARIN LEVEL (UNFRACTIONATED): Heparin Unfractionated: 0.44 IU/mL (ref 0.30–0.70)

## 2019-03-09 MED ORDER — ENOXAPARIN SODIUM 100 MG/ML ~~LOC~~ SOLN
1.0000 mg/kg | Freq: Two times a day (BID) | SUBCUTANEOUS | Status: DC
  Administered 2019-03-09: 100 mg via SUBCUTANEOUS
  Filled 2019-03-09: qty 1

## 2019-03-09 MED ORDER — ENOXAPARIN SODIUM 100 MG/ML ~~LOC~~ SOLN
1.0000 mg/kg | Freq: Two times a day (BID) | SUBCUTANEOUS | Status: DC
  Administered 2019-03-09 – 2019-03-12 (×6): 100 mg via SUBCUTANEOUS
  Filled 2019-03-09 (×7): qty 1

## 2019-03-09 NOTE — Progress Notes (Signed)
[place] for Heparin Drip Management  Indication: History of DVT/PE; patient on apixaban as an outpatient   No Known Allergies  Patient Measurements: Height: 5' 2.01" (157.5 cm) Weight: 220 lb 0.3 oz (99.8 kg) IBW/kg (Calculated) : 50.12 Heparin Dosing Weight: 71 kg.   Vital Signs: Temp: 98.7 F (37.1 C) (04/29 0400) Temp Source: Axillary (04/29 0400) BP: 97/57 (04/29 0300) Pulse Rate: 54 (04/29 0300)  Labs: Recent Labs    03/07/19 0334 03/08/19 0425 03/09/19 0438  HGB 7.9* 8.6*  --   HCT 28.0* 31.1*  --   PLT 239 302  --   HEPARINUNFRC 0.46 0.41 0.44  CREATININE 0.42* 0.44 0.46    Estimated Creatinine Clearance: 57.8 mL/min (by C-G formula based on SCr of 0.46 mg/dL).   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 Patient takes apixban 5mg  BID as an outpatient. Patient admitted on 4/13 unable to tolerate oral medications and was started on enoxaparin 40mg  Q24hr at that time. Patient admitted to ICU on 4/21 with worsening respiratory and subsequently intubated. Order to start heparin infusion given post intubation. Patient last received enoxaparin 40mg  at 2023 on 4/20. Heparin infusing at 1400 units/hr.   Goal of Therapy:  Heparin level 0.3-0.7 units/ml Monitor platelets by anticoagulation protocol: Yes   Plan:  04/29 @ 0438 HL 0.44. Level remains therapeutic. Will continue current rate at 1400 units/hr.  Hgb:6.4>> 7.7>> 7.9>> 8.6. Continue to check HL and CBCs w/ am labs. hgb low-stable will continue to monitor.  Pernell Dupre, PharmD, BCPS Clinical Pharmacist 03/09/2019 5:42 AM

## 2019-03-09 NOTE — Progress Notes (Signed)
CRITICAL CARE NOTE        SUBJECTIVE FINDINGS & SIGNIFICANT EVENTS   Patient remains critically ill Remains intubated on mechanical ventilation however is responsive to verbal communication with gesturing. Palliative care team is working closely with family about goals of care. Vent management today - on PS15cmH20 only ~300ccVt  SIGNIFICANT EVENTS 4/10 admitted for COPD exacerbation  4/12 discharged to home 4/13 re-admitted for COPD exacerbation, SVT 4/21 re-admitted to ICU for new RT sided pneumonia and severe resp failure, intubated #8 ETT 4/24 failed SAT/SBT 4/25 started on steroids for COPD and prevention of postt extubation stridor 4/27 -PALS meeting with family- possible terminal weaning Friday 4/27 -  PAST MEDICAL HISTORY   Past Medical History:  Diagnosis Date  . Acute deep vein thrombosis (DVT) of distal vein of left lower extremity ([place]) 10/03/2015  . Acute pulmonary embolism ([place]) 10/03/2015  . Anticoagulant long-term use   . Arthritis   . Arthritis   . CHF (congestive heart failure) ([place])   . Chronic anticoagulation 10/03/2015  . Collagen vascular disease ([place])   . Colostomy in place [facility]) 10/04/2015  . Deep venous thrombosis (HCC)    right lower extremity  . Dependent edema   . Diverticulosis of intestine with bleeding 04/02/2015  . Glenohumeral arthritis 06/28/2012  . Hammertoe 09/02/2012  . History of bilateral hip replacements 09/02/2012  . History of hysterectomy   . Hypertension   . Hypertension   . Hypokalemia   . Inability to ambulate due to hip 10/12/2015  . Mandibular dysfunction   . Onychomycosis 09/02/2012  . Osteoarthritis   . Polycythemia vera ([place])   . Presence of IVC filter 05/25/2015  . 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 Urinary incontinence in female  09/02/2016     SURGICAL HISTORY   Past Surgical History:  Procedure Laterality Date  . ABDOMINAL HYSTERECTOMY    . BLEPHAROPLASTY Right 03/2017   right upper eyelid  . CARDIAC CATHETERIZATION Right 04/03/2015   Procedure: CENTRAL LINE INSERTION;  Surgeon: Sherri Rad, MD;  Location: [facility] ORS;  Service: General;  Laterality: Right;  . COLECTOMY WITH COLOSTOMY CREATION/HARTMANN PROCEDURE N/A 04/03/2015   Procedure: COLECTOMY WITH COLOSTOMY CREATION/HARTMANN PROCEDURE;  Surgeon: Sherri Rad, MD;  Location: [facility] ORS;  Service: General;  Laterality: N/A;  . COLON SURGERY    . FOOT AMPUTATION     partial  . FOOT AMPUTATION Right   . IVC FILTER INSERTION    . JOINT REPLACEMENT     Left and Right Hip  . PERIPHERAL VASCULAR CATHETERIZATION N/A 04/02/2015   Procedure: Visceral Angiography;  Surgeon: Algernon Huxley, MD;  Location: [place] CV LAB;  Service: Cardiovascular;  Laterality: N/A;  . PERIPHERAL VASCULAR CATHETERIZATION N/A 04/02/2015   Procedure: Visceral Artery Intervention;  Surgeon: Algernon Huxley, MD;  Location: [place] CV LAB;  Service: Cardiovascular;  Laterality: N/A;  . PERIPHERAL VASCULAR CATHETERIZATION N/A 05/25/2015   Procedure: IVC Filter Insertion;  Surgeon: Katha Cabal, MD;  Location: [place] CV LAB;  Service: Cardiovascular;  Laterality: N/A;  . TOE AMPUTATION     right  . TOTAL HIP ARTHROPLASTY       FAMILY HISTORY   Family History  Problem Relation Age of Onset  . Brain cancer Mother   . Heart attack Father   . COPD Father   . Coronary artery disease Father   . Hypertension Other   . Arthritis-Osteo Other   . Diabetes Brother   . Arthritis Brother   .  Osteosarcoma Son   . Bone cancer Son   . Arthritis Sister   . Arthritis Brother   . Hypertension Brother   . Coronary artery disease Brother   . Malignant hyperthermia Brother      SOCIAL HISTORY   Social History   Tobacco Use  . Smoking status: Never Smoker  . Smokeless tobacco:  Never Used  Substance Use Topics  . Alcohol use: No  . Drug use: No     MEDICATIONS   Current Medication:  Current Facility-Administered Medications:  .  0.9 %  sodium chloride infusion, , Intravenous, PRN, Hillary Bow, MD, Stopped at 03/06/19 0016 .  0.9 %  sodium chloride infusion, 250 mL, Intravenous, Continuous, Flora Lipps, MD, Stopped at 03/02/19 1445 .  acetaminophen (TYLENOL) tablet 650 mg, 650 mg, Oral, Q6H PRN, 650 mg at 03/01/19 1200 **OR** acetaminophen (TYLENOL) suppository 650 mg, 650 mg, Rectal, Q6H PRN, Sudini, Srikar, MD .  adenosine (ADENOCARD) 12 MG/4ML injection 6 mg, 6 mg, Intravenous, Once, Awilda Bill, NP .  aspirin chewable tablet 81 mg, 81 mg, Per Tube, Daily, Kasa, Kurian, MD, 81 mg at 03/08/19 0900 .  chlorhexidine gluconate (MEDLINE KIT) (PERIDEX) 0.12 % solution 15 mL, 15 mL, Mouth Rinse, BID, Kasa, Kurian, MD, 15 mL at 03/08/19 1929 .  dexmedetomidine (PRECEDEX) 400 MCG/100ML (4 mcg/mL) infusion, 0.4-1.2 mcg/kg/hr, Intravenous, Titrated, Epifanio Lesches, MD, Last Rate: 22 mL/hr at 03/09/19 0600, 1 mcg/kg/hr at 03/09/19 0600 .  feeding supplement (VITAL HIGH PROTEIN) liquid 1,000 mL, 1,000 mL, Per Tube, Continuous, Kasa, Kurian, MD, Last Rate: 50 mL/hr at 03/08/19 2333, 1,000 mL at 03/08/19 2333 .  fentaNYL (SUBLIMAZE) injection 25 mcg, 25 mcg, Intravenous, Q1H PRN, Flora Lipps, MD, 25 mcg at 03/09/19 0554 .  heparin ADULT infusion 100 units/mL (25000 units/262m sodium chloride 0.45%), 1,400 Units/hr, Intravenous, Continuous, WRocky Morel RPH, Last Rate: 14 mL/hr at 03/09/19 0600, 1,400 Units/hr at 03/09/19 0600 .  insulin aspart (novoLOG) injection 0-9 Units, 0-9 Units, Subcutaneous, Q4H, KFlora Lipps MD, 1 Units at 03/09/19 0456 .  ipratropium-albuterol (DUONEB) 0.5-2.5 (3) MG/3ML nebulizer solution 3 mL, 3 mL, Nebulization, Q4H PRN, SWilhelmina Mcardle MD .  ipratropium-albuterol (DUONEB) 0.5-2.5 (3) MG/3ML nebulizer solution 3 mL, 3 mL,  Nebulization, Q6H, Kasa, Kurian, MD, 3 mL at 03/09/19 0722 .  lip balm (BLISTEX) ointment, , Topical, PRN, KDarel HongD, NP .  MEDLINE mouth rinse, 15 mL, Mouth Rinse, 10 times per day, KFlora Lipps MD, 15 mL at 03/09/19 0612 .  metoprolol tartrate (LOPRESSOR) injection 2.5 mg, 2.5 mg, Intravenous, Q8H PRN, GTyler Pita MD .  multivitamin liquid 15 mL, 15 mL, Per Tube, Daily, Kasa, Kurian, MD, 15 mL at 03/08/19 0900 .  [DISCONTINUED] ondansetron (ZOFRAN) tablet 4 mg, 4 mg, Oral, Q6H PRN **OR** ondansetron (ZOFRAN) injection 4 mg, 4 mg, Intravenous, Q6H PRN, Sudini, Srikar, MD .  pantoprazole sodium (PROTONIX) 40 mg/20 mL oral suspension 40 mg, 40 mg, Per Tube, Daily, Kasa, Kurian, MD, 40 mg at 03/08/19 0900 .  polyethylene glycol (MIRALAX / GLYCOLAX) packet 17 g, 17 g, Oral, Daily PRN, Sudini, Srikar, MD .  senna-docusate (Senokot-S) tablet 1 tablet, 1 tablet, Oral, BID, KFlora Lipps MD, 1 tablet at 03/08/19 2202 .  sodium chloride flush (NS) 0.9 % injection 10-40 mL, 10-40 mL, Intracatheter, Q12H, KDarel HongD, NP, 30 mL at 03/08/19 2203 .  sodium chloride flush (NS) 0.9 % injection 10-40 mL, 10-40 mL, Intracatheter, PRN, KBradly Bienenstock NP .  sodium chloride flush (NS) 0.9 % injection 3 mL, 3 mL, Intravenous, Q12H, Sudini, Srikar, MD, 3 mL at 03/08/19 2203    ALLERGIES   Patient has no known allergies.    REVIEW OF SYSTEMS    Unable to obtain ROS due to mechanical ventilation and sedation  PHYSICAL EXAMINATION   Vitals:   03/09/19 0600 03/09/19 0726  BP: 119/78   Pulse: 74   Resp: 16   Temp:    SpO2: 100% 100%    GENERAL: Chronically ill-appearing HEAD: Normocephalic, atraumatic.  EYES: Pupils equal, round, reactive to light.  No scleral icterus.  MOUTH: Moist mucosal membrane. NECK: Supple. No thyromegaly. No nodules. No JVD.  PULMONARY: Mild bibasilar crepitations without rhonchorous breath sounds CARDIOVASCULAR: S1 and S2. Regular rate and rhythm.  No murmurs, rubs, or gallops.  GASTROINTESTINAL: Soft, nontender, non-distended. No masses. Positive bowel sounds. No hepatosplenomegaly.  MUSCULOSKELETAL: No swelling, clubbing, or edema.  NEUROLOGIC: Mild distress due to acute illness SKIN:intact,warm,dry   LABS AND IMAGING     LAB RESULTS: Recent Labs  Lab 03/07/19 0334 03/08/19 0425 03/09/19 0438  NA 137 140 142  K 4.4 4.5 3.9  CL 106 107 108  CO2 '26 27 27  '$ BUN 25* 29* 29*  CREATININE 0.42* 0.44 0.46  GLUCOSE 209* 166* 145*   Recent Labs  Lab 03/07/19 0334 03/08/19 0425 03/09/19 0438  HGB 7.9* 8.6* 8.6*  HCT 28.0* 31.1* 31.0*  WBC 12.9* 12.9* 13.7*  PLT 239 302 348     IMAGING RESULTS: No results found.    ASSESSMENT AND PLAN     Acute Hypoxic Respiratory Failure       -History of pulmonary venous thromboembolism and COPD  -Status post treatment for community-acquired pneumonia-completed 7-day course -continue Full MV support -continue Bronchodilator Therapy -Wean Fio2 and PEEP as tolerated -will perform SAT/SBT when respiratory parameters are met -Palliative care meeting with family regarding goals of care-possible terminal weaning this Friday    NEUROLOGY - intubated and sedated - minimal sedation to achieve a RASS goal: -1 Wake up assessment pending   ID -continue IV abx as prescibed -follow up cultures  GI/Nutrition GI PROPHYLAXIS as indicated DIET-->TF's as tolerated Constipation protocol as indicated  ENDO - ICU hypoglycemic\Hyperglycemia protocol -check FSBS per protocol   ELECTROLYTES -follow labs as needed -replace as needed -pharmacy consultation   DVT/GI PRX ordered -SCDs  TRANSFUSIONS AS NEEDED MONITOR FSBS ASSESS the need for LABS as needed   Critical care provider statement:   Critical care time (minutes): 32  Critical care time was exclusive of: Separately billable procedures and treating other patients  Critical care was necessary to treat  or prevent imminent or life-threatening deterioration of the following conditions:  Acute hypoxemic respiratory failure, multiple comorbid conditions  Critical care was time spent personally by me on the following activities: Development of treatment plan with patient or surrogate, discussions with consultants, evaluation of patient's response to treatment, examination of patient, obtaining history from patient or surrogate, ordering and performing treatments and interventions, ordering and review of laboratory studies and re-evaluation of patient's condition.  I assumed direction of critical care for this patient from another provider in my specialty: no    This document was prepared using Dragon voice recognition software and may include unintentional dictation errors.    Ottie Glazier, M.D.  Division of [place]

## 2019-03-09 NOTE — Progress Notes (Addendum)
Daily Progress Note   Patient Name: Yolanda Wells       Date: 03/09/2019 DOB: 10-Oct-1932  Age: 85 y.o. MRN#: [id number] Attending Physician: Hillary Bow, MD Primary Care Physician: [name], MD Admit Date: 03/07/2019  Reason for Consultation/Follow-up: Establishing goals of care  Subjective: Patient resting in bed on ventilator. She is able to nod and shake her head to communicate. She nods is feeling better today. She shakes her head no that she does not want a tracheostomy.   Spoke with [name]. She states she has had a chance to rest and is feeling better. She states she has called for updates and does not have questions at this time. She understands her grandmother's ventilator status and inability to wean sustainably from the ventilator. She is happy for the decreased calls. She states she still needs to speak with her uncle about their decision on trach PEG vs comfort care, and she states it sounds as if her grandmother has made the decision for herself. It sounds as though they are heavily leaning toward comfort care decision Friday.  She would like a video chat today to see if her grandmother will answer her questions (shaking and nodding head). She would also like a video chat tomorrow as it is her grandmother's birthday.   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   Would continue to recommend 1 person (aside from palliative), preferably CCM provider to call her per day with an update/questions. [name] states she would prefer to discuss her decision and plans on Friday directly with CCM as this writer will not be working, and she does not prefer an alternate palliative provider.    Length of Stay:  16  Current Medications: Scheduled Meds:  . adenosine (ADENOCARD) IV  6 mg Intravenous Once  . aspirin  81 mg Per Tube Daily  . chlorhexidine gluconate (MEDLINE KIT)  15 mL Mouth Rinse BID  . enoxaparin (LOVENOX) injection  1 mg/kg Subcutaneous Q12H  . insulin aspart  0-9 Units Subcutaneous Q4H  . ipratropium-albuterol  3 mL Nebulization Q6H  . mouth rinse  15 mL Mouth Rinse 10 times per day  . multivitamin  15 mL Per Tube Daily  . pantoprazole sodium  40  mg Per Tube Daily  . senna-docusate  1 tablet Oral BID  . sodium chloride flush  10-40 mL Intracatheter Q12H  . sodium chloride flush  3 mL Intravenous Q12H    Continuous Infusions: . sodium chloride Stopped (03/06/19 0016)  . sodium chloride Stopped (03/02/19 1445)  . dexmedetomidine (PRECEDEX) IV infusion 0.8 mcg/kg/hr (03/09/19 1200)  . feeding supplement (VITAL HIGH PROTEIN) 1,000 mL (03/08/19 2333)    PRN Meds: sodium chloride, acetaminophen **OR** acetaminophen, fentaNYL (SUBLIMAZE) injection, ipratropium-albuterol, lip balm, metoprolol tartrate, [DISCONTINUED] ondansetron **OR** ondansetron (ZOFRAN) IV, polyethylene glycol, sodium chloride flush  General: Intubated Lungs: Intubated  Neurologic: intubated         Vital Signs: BP (!) 142/91   Pulse 94   Temp 98.3 F (36.8 C) (Oral)   Resp 15   Ht 5' 2.01" (1.575 m)   Wt 99.8 kg   SpO2 97%   BMI 40.23 kg/m  SpO2: SpO2: 97 % O2 Device: O2 Device: Ventilator O2 Flow Rate: O2 Flow Rate (L/min): 2 L/min  Intake/output summary:   Intake/Output Summary (Last 24 hours) at 03/09/2019 1215 Last data filed at 03/09/2019 1200 Gross per 24 hour  Intake 647.64 ml  Output 1150 ml  Net -502.36 ml   LBM: Last BM Date: 03/09/19 Baseline Weight: Weight: 90 kg Most recent weight: Weight: 99.8 kg       Palliative Assessment/Data: INTUBATED     Flowsheet Rows     Most Recent Value  Intake Tab  Referral Department  Hospitalist  Unit at Time of Referral  Med/Surg Unit   Palliative Care Primary Diagnosis  Pulmonary  Date Notified  02/23/2019  Palliative Care Type  Return patient Palliative Care  Reason for referral  Clarify Goals of Care  Date of Admission  03/10/2019  Date first seen by Palliative Care  02/22/19  # of days Palliative referral response time  1 Day(s)  # of days IP prior to Palliative referral  0  Clinical Assessment  Palliative Performance Scale Score  30%  Pain Max last 24 hours  Not able to report  Pain Min Last 24 hours  Not able to report  Dyspnea Max Last 24 Hours  Not able to report  Dyspnea Min Last 24 hours  Not able to report  Psychosocial & Spiritual Assessment  Palliative Care Outcomes      Patient Active Problem List   Diagnosis Date Noted  . Acute respiratory failure with hypoxia and hypercapnia (HCC)   . Acute on chronic respiratory failure ([place]) 02/13/2019  . Decreased appetite 02/02/2019  . Poor appetite 12/24/2018  . Unsteady 12/24/2018  . Palliative care encounter 12/24/2018  . SOB (shortness of breath) 10/28/2018  . Shortness of breath 09/27/2018  . COPD exacerbation ([place]) 09/22/2018  . Sepsis ([place]) 09/22/2018  . Lower GI bleed 09/09/2018  . AMS (altered mental status) 08/30/2018  . Pressure injury of skin 08/13/2018  . B12 deficiency 08/04/2018  . Anemia 06/07/2018  . Secondary myelofibrosis ([place]) 02/17/2018  . Goals of care, counseling/discussion 12/23/2017  . Palliative care by specialist   . DNR (do not resuscitate) discussion   . Weakness generalized   . Chronic diastolic CHF (congestive heart failure) ([place]) 07/28/2017  . Vascular dementia without behavioral disturbance ([place]) 07/08/2017  . Influenza with respiratory manifestation 12/01/2016  . Respiratory distress 11/23/2016  . Urinary incontinence in female 09/02/2016  . Vision loss of right eye 08/26/2016  . Blindness of right eye 08/26/2016  . Inability to ambulate due to  hip 10/12/2015  . Colostomy in place [facility]) 10/04/2015  . Long term  current use of anticoagulant therapy 10/03/2015  . Leukocytosis 09/25/2015  . CAP (community acquired pneumonia) 09/24/2015  . COPD (chronic obstructive pulmonary disease) ([place]) 09/24/2015  . UTI (urinary tract infection) 07/16/2015  . Abdominal wall cellulitis 07/15/2015  . DVT (deep venous thrombosis) ([place]) 07/15/2015  . HTN (hypertension) 07/15/2015  . Polycythemia vera ([place]) 07/15/2015  . Arthritis 07/15/2015  . Pulmonary emboli ([place]) 05/25/2015  . Diverticulosis of colon with hemorrhage 04/02/2015  . Diverticulosis of intestine with bleeding 04/02/2015  . Fall 01/10/2015  . Osteoarthritis 01/10/2015  . Hammertoe 09/02/2012  . S/P transmetatarsal amputation of foot ([place]) 09/02/2012  . Onychomycosis 09/02/2012  . Other specified dermatoses 09/02/2012  . S/P hip replacement 08/23/2012  . Glenohumeral arthritis 06/28/2012    Palliative Care Assessment & Plan   Patient Profile: Per [name], NP: 85 y.o. female  with past medical history of  heart failure, long-term anticoagulation use, polycythemia vera, collagen vascular disease cerebellar stroke, history of PE with IVC filter, history of DVT left lower extremity / right lower extremity, bilateral hip replacements, osteoarthritis, diverticulosis with intestinal bleeding, colostomy 2016, hypertension, arthritis, partial right foot amputation admitted on 03/01/2019 with acute on chronic hypoxic/hypercarbic respiratory failure likely secondary to acute on chronic diastolic heart failure.  PMT consulted for goals of care, 8 admissions and 3 ED visits in 6 months  Recommendations/Plan:  Full Code  Full aggressive medical interventions  Family in discussion about how to proceed with care in future, trach, peg, limitations to intubation period.   PMT will continue to follow and support as able  Leaning toward comfort care. Will not make a decision until Friday, stating she was advised she has until then.   Recommend 1 person (aside from  palliative) per day , preferably CCM MD, to call with updates/questions if possible.    Goals of Care and Additional Recommendations:  Limitations on Scope of Treatment: Full Scope Treatment  Code Status:    Code Status Orders  (From admission, onward)         Start     Ordered   02/10/2019 1349  Full code  Continuous     02/20/2019 1349        Code Status History    Date Active Date Inactive Code Status Order ID Comments User Context   02/18/2019 1813 02/20/2019 2309 Full Code [id number]  Otila Back, MD Inpatient   11/08/2018 1532 11/10/2018 2032 DNR [id number]  Dustin Flock, MD Inpatient   10/28/2018 1334 10/31/2018 1621 DNR [id number]  Dustin Flock, MD Inpatient   09/27/2018 1901 09/30/2018 1545 DNR [id number]  Loletha Grayer, MD ED   09/22/2018 0133 09/25/2018 2201 Full Code [id number]  Amelia Jo, MD Inpatient   09/09/2018 1240 09/12/2018 2008 Full Code [id number]  Loletha Grayer, MD ED   08/30/2018 2018 09/01/2018 2004 DNR [id number]  Nicholes Mango, MD ED   08/30/2018 1903 08/30/2018 2018 Full Code [id number]  Nicholes Mango, MD ED   08/13/2018 1038 08/15/2018 1836 Full Code [id number]  Loletha Grayer, MD Inpatient   08/12/2018 2001 08/13/2018 1038 DNR [id number]  Gorden Harms, MD ED   08/12/2018 2001 08/12/2018 2001 Full Code [id number]  Gorden Harms, MD ED   06/15/2018 1635 06/18/2018 2009 Full Code [id number]  [name], MD Inpatient   02/24/2018 0347 02/27/2018 0027 Full Code [id number]  Amelia Jo, MD Inpatient   10/15/2017 1141 10/16/2017 2204 Full Code [id number]  Knox Royalty,  NP Inpatient   10/13/2017 2246 10/15/2017 1141 DNR [id number]  Lance Coon, MD ED   10/06/2017 1454 10/09/2017 2058 DNR [id number]  Nicholes Mango, MD Inpatient   10/06/2017 1111 10/06/2017 1453 Full Code [id number]  Nicholes Mango, MD Inpatient   05/27/2017 1954 05/28/2017 2013 Full Code [id number]  Vaughan Basta, MD Inpatient   05/23/2017 2322 05/26/2017 1614 Full Code [id number]  Lance Coon, MD Inpatient   04/10/2017 0325 04/11/2017 1916 Full Code [id number]  Harrie Foreman, MD Inpatient   11/23/2016 0629 12/01/2016 1935 Full Code [id number]  Harrie Foreman, MD Inpatient   08/26/2016 0445 08/26/2016 1923 Full Code [id number]  Saundra Shelling, MD Inpatient   09/22/2015 1755 09/25/2015 1844 Full Code [id number]  Demetrios Loll, MD ED   09/15/2015 0341 09/17/2015 1741 Full Code [id number]  Harrie Foreman, MD Inpatient   07/16/2015 0151 07/20/2015 2334 Full Code [id number]  Lance Coon, MD Inpatient   05/25/2015 0834 05/29/2015 1616 Full Code [id number]  Harrie Foreman, MD Inpatient   04/02/2015 0606 04/08/2015 1651 Full Code [id number]  Harrie Foreman, MD Inpatient      Prognosis:   POOR   Discharge Planning:  To Be Determined   The above conversation was completed via telephone due to the visitor restrictions during the COVID-19 pandemic. Thorough chart review and discussion with necessary members of the care team was completed as part of assessment.   Thank you for allowing the Palliative Medicine Team to assist in the care of this patient.   35 min

## 2019-03-09 NOTE — Progress Notes (Signed)
[place] [facility] [place] NAME: Yolanda Wells    MR#:  [id number]  DATE OF BIRTH:  August 26, 1932    CHIEF COMPLAINT:   Chief Complaint  Patient presents with  . Altered Mental Status  . Shortness of Breath   Patient is on full ventilatory support.  Awake.   REVIEW OF SYSTEMS:  Patient is intubated  DRUG ALLERGIES:  No Known Allergies  VITALS:  Blood pressure (!) 97/57, pulse (!) 57, temperature 98.2 F (36.8 C), temperature source Oral, resp. rate 18, height 5' 2.01" (1.575 m), weight 99.8 kg, SpO2 100 %.  PHYSICAL EXAMINATION:   Physical Exam  GENERAL:  85 y.o.-year-old patient lying in the bed, obese. Critically ill EYES: Pupils equal, round, reactive to light and accommodation. No scleral icterus. Extraocular muscles intact.  HEENT: Head atraumatic, normocephalic. Oropharynx and nasopharynx clear.  NECK:  Supple, no jugular venous distention. No thyroid enlargement, no tenderness.  LUNGS: Decrease breath sounds bilatera CARDIOVASCULAR: S1, S2 normal. No murmurs, rubs, or gallops.  ABDOMEN: Soft, nontender, nondistended. Bowel sounds present. No organomegaly or mass.  EXTREMITIES: No cyanosis, clubbing. LE edema PSYCHIATRIC: could not be assessed  LABORATORY PANEL:   CBC Recent Labs  Lab 03/09/19 0438  WBC 13.7*  HGB 8.6*  HCT 31.0*  PLT 348   ------------------------------------------------------------------------------------------------------------------ Chemistries  Recent Labs  Lab 03/08/19 0425 03/09/19 0438  NA 140 142  K 4.5 3.9  CL 107 108  CO2 27 27  GLUCOSE 166* 145*  BUN 29* 29*  CREATININE 0.44 0.46  CALCIUM 8.5* 8.7*  MG 2.1  --    ------------------------------------------------------------------------------------------------------------------  Cardiac Enzymes No results for input(s): TROPONINI in the last 168  hours. ------------------------------------------------------------------------------------------------------------------  RADIOLOGY:  Dg Chest Port 1 View  Result Date: 03/09/2019 CLINICAL DATA:  Acute respiratory failure. EXAM: PORTABLE CHEST 1 VIEW COMPARISON:  One-view chest x-ray 03/01/2019 FINDINGS: Endotracheal tube terminates 2.5 cm above the carina. Heart is mildly enlarged. Mitral annular calcifications are again seen. Aortic atherosclerosis is present. NG tube courses off the inferior border the film. Progressive bibasilar airspace disease is present, right greater than left. Bilateral effusions are suspected. IMPRESSION: 1. Increasing bilateral lower lobe airspace disease/pneumonia. 2. Suspect bilateral pleural effusions. 3. Endotracheal tube has advanced, now terminating 2.5 cm above the carina. Electronically Signed   By: San Morelle M.D.   On: 03/09/2019 07:38     ASSESSMENT AND PLAN:   * Acute on chronic hypoxic hypercarbic respiratory failure failure, COPD exacerbation,, pneumonia, treated with IV antibiotics vancomycin, cefepime., bronchodilators, wean as tolerated.  Finish steroid course.   * Pulm HTN, COPD and now PNA On mechanical ventilator Intensivist follow-up Continue vent bundle Weaning trials if parameters met  * RLL HCAP Treated with IV vancomycin and cefepime Finished course  *Hypotension Proved  * Acute metabolic encephalopathy secondary to hypercarbia.  Has baseline mild cognitive impairment. On ventilator at this time Has NIV set up at home.  * Atrial  fibrillation with RVR, SVT On therapeutic dose of Lovenox Unable to use scheduled rate control medications due to hypotension  *COPD exacerbation-improved Continue nebulization therapy Treated with IV steroids and now stopped  *DVT prophylaxis Lovenox  * Dementia, agitation, continue Seroquel, Ativan. Palliative care consulted and following.  All the records are reviewed and case  discussed with Care Management/Social Worker Management plans discussed with the patient, family and they are in agreement.  CODE STATUS: FULL CODE  TOTAL TIME TAKING CARE OF THIS PATIENT: 35 minutes.  POSSIBLE D/C IN 1-2 DAYS, DEPENDING ON CLINICAL CONDITION.  [name] M.D on 03/09/2019 at 10:33 AM  Between 7am to 6pm - Pager - [phone]  After 6pm go to [email or web address] - password [email or web address] [place] [facility]  Office  [phone]  CC: Primary care physician; [name], MD  Note: This dictation was prepared with Dragon dictation along with smaller phrase technology. Any transcriptional errors that result from this process are unintentional.

## 2019-03-09 NOTE — Progress Notes (Signed)
Discussed goals of care via telephone with pts granddaughter [name] and another family member regarding plan of care.  They both stated they would like to continue current plan of care for now and discuss goals of treatment on Friday Mar 11, 2019. All questions were answered.   Marda Stalker, [place] Pager [phone] (please enter 7 digits) PCCM Consult Pager [phone] (please enter 7 digits)

## 2019-03-09 NOTE — Progress Notes (Signed)
Pharmacy Electrolyte Monitoring Consult:  Pharmacy consulted to assist in monitoring and replacing electrolytes in this 85 y.o. female admitted on 02/14/2019 with Altered Mental Status and Shortness of Breath   Labs:  Sodium (mmol/L)  Date Value  03/09/2019 142  04/10/2015 137  07/08/2014 144   Potassium (mmol/L)  Date Value  03/09/2019 3.9  07/08/2014 3.7   Magnesium (mg/dL)  Date Value  03/08/2019 2.1  05/08/2014 2.6 (H)   Phosphorus (mg/dL)  Date Value  03/07/2019 3.0   Calcium (mg/dL)  Date Value  03/09/2019 8.7 (L)   Calcium, Total (mg/dL)  Date Value  07/08/2014 8.4 (L)   Albumin (g/dL)  Date Value  02/16/2019 3.1 (L)  07/08/2014 2.9 (L)     Assessment/Plan: No supplementation needed at this time.   Will replace for goal potassium ~ 4 and goal magnesium ~ 2.   Will obtain electrolytes as clinically indicated.   Pharmacy will continue to monitor and adjust per consult.   Declyn Delsol L 03/09/2019 4:25 PM

## 2019-03-10 ENCOUNTER — Inpatient Hospital Stay: Payer: Medicare Other

## 2019-03-10 LAB — BASIC METABOLIC PANEL
Anion gap: 6 (ref 5–15)
BUN: 31 mg/dL — ABNORMAL HIGH (ref 8–23)
CO2: 28 mmol/L (ref 22–32)
Calcium: 8.5 mg/dL — ABNORMAL LOW (ref 8.9–10.3)
Chloride: 108 mmol/L (ref 98–111)
Creatinine, Ser: 0.39 mg/dL — ABNORMAL LOW (ref 0.44–1.00)
GFR calc Af Amer: >60 mL/min (ref >60)
GFR calc non Af Amer: >60 mL/min (ref >60)
Glucose, Bld: 151 mg/dL — ABNORMAL HIGH (ref 70–99)
Potassium: 4.1 mmol/L (ref 3.5–5.1)
Sodium: 142 mmol/L (ref 135–145)

## 2019-03-10 LAB — GLUCOSE, CAPILLARY
Glucose-Capillary: 133 mg/dL — ABNORMAL HIGH (ref 70–99)
Glucose-Capillary: 136 mg/dL — ABNORMAL HIGH (ref 70–99)
Glucose-Capillary: 146 mg/dL — ABNORMAL HIGH (ref 70–99)
Glucose-Capillary: 150 mg/dL — ABNORMAL HIGH (ref 70–99)
Glucose-Capillary: 160 mg/dL — ABNORMAL HIGH (ref 70–99)
Glucose-Capillary: 164 mg/dL — ABNORMAL HIGH (ref 70–99)
Glucose-Capillary: 176 mg/dL — ABNORMAL HIGH (ref 70–99)

## 2019-03-10 LAB — CBC
HCT: 27.1 % — ABNORMAL LOW (ref 36.0–46.0)
Hemoglobin: 7.6 g/dL — ABNORMAL LOW (ref 12.0–15.0)
MCH: 24.5 pg — ABNORMAL LOW (ref 26.0–34.0)
MCHC: 28 g/dL — ABNORMAL LOW (ref 30.0–36.0)
MCV: 87.4 fL (ref 80.0–100.0)
Platelets: 319 10*3/uL (ref 150–400)
RBC: 3.1 MIL/uL — ABNORMAL LOW (ref 3.87–5.11)
RDW: 29.1 % — ABNORMAL HIGH (ref 11.5–15.5)
WBC: 11.5 10*3/uL — ABNORMAL HIGH (ref 4.0–10.5)
nRBC: 5.7 % — ABNORMAL HIGH (ref 0.0–0.2)

## 2019-03-10 MED ORDER — FUROSEMIDE 10 MG/ML IJ SOLN
20.0000 mg | Freq: Once | INTRAMUSCULAR | Status: AC
  Administered 2019-03-10: 20 mg via INTRAVENOUS
  Filled 2019-03-10: qty 2

## 2019-03-10 MED ORDER — METHYLPREDNISOLONE SODIUM SUCC 40 MG IJ SOLR
40.0000 mg | Freq: Two times a day (BID) | INTRAMUSCULAR | Status: DC
  Administered 2019-03-10 – 2019-03-13 (×6): 40 mg via INTRAVENOUS
  Filled 2019-03-10 (×6): qty 1

## 2019-03-10 MED ORDER — MIDAZOLAM HCL 2 MG/2ML IJ SOLN
1.0000 mg | INTRAMUSCULAR | Status: DC | PRN
  Administered 2019-03-10 (×2): 1 mg via INTRAVENOUS
  Filled 2019-03-10 (×2): qty 2

## 2019-03-10 MED ORDER — ALBUMIN HUMAN 25 % IV SOLN
12.5000 g | Freq: Once | INTRAVENOUS | Status: AC
  Administered 2019-03-10: 11:00:00 12.5 g via INTRAVENOUS
  Filled 2019-03-10 (×2): qty 50

## 2019-03-10 MED ORDER — MIDAZOLAM HCL 2 MG/2ML IJ SOLN
1.0000 mg | INTRAMUSCULAR | Status: AC | PRN
  Administered 2019-03-10 (×3): 1 mg via INTRAVENOUS
  Filled 2019-03-10 (×3): qty 2

## 2019-03-10 NOTE — Progress Notes (Signed)
Assisted tele visit to patient with family member.  Milus Fritze R, RN  

## 2019-03-10 NOTE — Progress Notes (Signed)
Pt was placed on 5 cpap and 5 pressure support. Vt were on average or 300 ml and RR in the mid to high 30s. The RSBI is 127 and there is not an air leak.

## 2019-03-10 NOTE — Progress Notes (Addendum)
Daily Progress Note   Patient Name: Yolanda Wells       Date: 03/10/2019 DOB: Mar 31, 1932  Age: 85 y.o. MRN#: [id number] Attending Physician: Hillary Bow, MD Primary Care Physician: [name], MD Admit Date: 02/09/2019  Reason for Consultation/Follow-up: Establishing goals of care  Subjective: Patient resting in bed on ventilator. Plans for granddaughter (POA) Yolanda Wells to decide plan on trach vs extubation tomorrow. Discussed with CCM NP. This provider will not be working tomorrow, and so [name] would rather speak directly to CCM tomorrow regarding her decision.   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   Would continue to recommend 1 person (aside from palliative), preferably CCM provider to call her per day with an update/questions. Yolanda Wells states she would prefer to discuss her decision and plans on Friday directly with CCM as this writer will not be working, and she does not prefer an alternate palliative provider.    Length of Stay: 17  Current Medications: Scheduled Meds:  . aspirin  81 mg Per Tube Daily  . chlorhexidine gluconate (MEDLINE KIT)  15 mL Mouth Rinse BID  . enoxaparin (LOVENOX) injection  1 mg/kg Subcutaneous Q12H  . insulin aspart  0-9 Units Subcutaneous Q4H  . ipratropium-albuterol  3 mL Nebulization Q6H  . mouth rinse  15 mL Mouth Rinse 10 times per day  . multivitamin  15 mL Per Tube Daily  . pantoprazole sodium  40 mg Per Tube Daily  . senna-docusate  1 tablet Oral BID  . sodium chloride flush  10-40 mL Intracatheter Q12H  . sodium chloride flush  3 mL Intravenous Q12H    Continuous Infusions: . sodium chloride Stopped (03/06/19 0016)  . sodium chloride Stopped (03/02/19  1445)  . dexmedetomidine (PRECEDEX) IV infusion 1.2 mcg/kg/hr (03/10/19 1200)  . feeding supplement (VITAL HIGH PROTEIN) 50 mL/hr at 03/10/19 1200    PRN Meds: sodium chloride, acetaminophen **OR** acetaminophen, fentaNYL (SUBLIMAZE) injection, ipratropium-albuterol, lip balm, metoprolol tartrate, midazolam, [DISCONTINUED] ondansetron **OR** ondansetron (ZOFRAN) IV, polyethylene glycol, sodium chloride flush  General: Intubated Lungs: Intubated  Neurologic: intubated         Vital Signs: BP 132/78 (BP Location: Left Wrist)   Pulse 90   Temp 99.2 F (37.3 C) (Axillary)  Resp (!) 35   Ht 5' 2.01" (1.575 m)   Wt 101.9 kg   SpO2 98%   BMI 41.08 kg/m  SpO2: SpO2: 98 % O2 Device: O2 Device: Ventilator O2 Flow Rate: O2 Flow Rate (L/min): 2 L/min  Intake/output summary:   Intake/Output Summary (Last 24 hours) at 03/10/2019 1227 Last data filed at 03/10/2019 1200 Gross per 24 hour  Intake 3303.29 ml  Output 1580 ml  Net 1723.29 ml   LBM: Last BM Date: 03/09/19 Baseline Weight: Weight: 90 kg Most recent weight: Weight: 101.9 kg       Palliative Assessment/Data: INTUBATED     Flowsheet Rows     Most Recent Value  Intake Tab  Referral Department  Hospitalist  Unit at Time of Referral  Med/Surg Unit  Palliative Care Primary Diagnosis  Pulmonary  Date Notified  02/16/2019  Palliative Care Type  Return patient Palliative Care  Reason for referral  Clarify Goals of Care  Date of Admission  03/07/2019  Date first seen by Palliative Care  02/22/19  # of days Palliative referral response time  1 Day(s)  # of days IP prior to Palliative referral  0  Clinical Assessment  Palliative Performance Scale Score  30%  Pain Max last 24 hours  Not able to report  Pain Min Last 24 hours  Not able to report  Dyspnea Max Last 24 Hours  Not able to report  Dyspnea Min Last 24 hours  Not able to report  Psychosocial & Spiritual Assessment  Palliative Care Outcomes      Patient Active  Problem List   Diagnosis Date Noted  . Acute respiratory failure with hypoxia and hypercapnia (HCC)   . Acute on chronic respiratory failure ([place]) 03/04/2019  . Decreased appetite 02/02/2019  . Poor appetite 12/24/2018  . Unsteady 12/24/2018  . Palliative care encounter 12/24/2018  . SOB (shortness of breath) 10/28/2018  . Shortness of breath 09/27/2018  . COPD exacerbation ([place]) 09/22/2018  . Sepsis ([place]) 09/22/2018  . Lower GI bleed 09/09/2018  . AMS (altered mental status) 08/30/2018  . Pressure injury of skin 08/13/2018  . B12 deficiency 08/04/2018  . Anemia 06/07/2018  . Secondary myelofibrosis ([place]) 02/17/2018  . Goals of care, counseling/discussion 12/23/2017  . Palliative care by specialist   . DNR (do not resuscitate) discussion   . Weakness generalized   . Chronic diastolic CHF (congestive heart failure) ([place]) 07/28/2017  . Vascular dementia without behavioral disturbance ([place]) 07/08/2017  . Influenza with respiratory manifestation 12/01/2016  . Respiratory distress 11/23/2016  . Urinary incontinence in female 09/02/2016  . Vision loss of right eye 08/26/2016  . Blindness of right eye 08/26/2016  . Inability to ambulate due to hip 10/12/2015  . Colostomy in place [facility]) 10/04/2015  . Long term current use of anticoagulant therapy 10/03/2015  . Leukocytosis 09/25/2015  . CAP (community acquired pneumonia) 09/24/2015  . COPD (chronic obstructive pulmonary disease) ([place]) 09/24/2015  . UTI (urinary tract infection) 07/16/2015  . Abdominal wall cellulitis 07/15/2015  . DVT (deep venous thrombosis) ([place]) 07/15/2015  . HTN (hypertension) 07/15/2015  . Polycythemia vera ([place]) 07/15/2015  . Arthritis 07/15/2015  . Pulmonary emboli ([place]) 05/25/2015  . Diverticulosis of colon with hemorrhage 04/02/2015  . Diverticulosis of intestine with bleeding 04/02/2015  . Fall 01/10/2015  . Osteoarthritis 01/10/2015  . Hammertoe 09/02/2012  . S/P transmetatarsal amputation of foot  ([place]) 09/02/2012  . Onychomycosis 09/02/2012  . Other specified dermatoses 09/02/2012  . S/P hip replacement  08/23/2012  . Glenohumeral arthritis 06/28/2012    Palliative Care Assessment & Plan   Patient Profile: Per Yolanda Boss, NP: 85 y.o. female  with past medical history of  heart failure, long-term anticoagulation use, polycythemia vera, collagen vascular disease cerebellar stroke, history of PE with IVC filter, history of DVT left lower extremity / right lower extremity, bilateral hip replacements, osteoarthritis, diverticulosis with intestinal bleeding, colostomy 2016, hypertension, arthritis, partial right foot amputation admitted on 02/20/2019 with acute on chronic hypoxic/hypercarbic respiratory failure likely secondary to acute on chronic diastolic heart failure.  PMT consulted for goals of care, 8 admissions and 3 ED visits in 6 months  Recommendations/Plan:  Full Code  Full aggressive medical interventions  Family in discussion about how to proceed with care in future, trach, peg vs comfort.   PMT will continue to follow and support as able  Leaning toward comfort care. Will not make a decision until Friday, stating she was advised she has until then.   Recommend 1 person (aside from palliative) per day , preferably CCM MD, to call with updates/questions if possible.    Goals of Care and Additional Recommendations:  Limitations on Scope of Treatment: Full Scope Treatment  Code Status:    Code Status Orders  (From admission, onward)         Start     Ordered   02/12/2019 1349  Full code  Continuous     02/13/2019 1349        Code Status History    Date Active Date Inactive Code Status Order ID Comments User Context   02/18/2019 1813 02/20/2019 2309 Full Code [id number]  Otila Back, MD Inpatient   11/08/2018 1532 11/10/2018 2032 DNR [id number]  Dustin Flock, MD Inpatient   10/28/2018 1334 10/31/2018 1621 DNR [id number]  Dustin Flock, MD Inpatient   09/27/2018 1901  09/30/2018 1545 DNR [id number]  Loletha Grayer, MD ED   09/22/2018 0133 09/25/2018 2201 Full Code [id number]  Amelia Jo, MD Inpatient   09/09/2018 1240 09/12/2018 2008 Full Code [id number]  Loletha Grayer, MD ED   08/30/2018 2018 09/01/2018 2004 DNR [id number]  Nicholes Mango, MD ED   08/30/2018 1903 08/30/2018 2018 Full Code [id number]  Nicholes Mango, MD ED   08/13/2018 1038 08/15/2018 1836 Full Code [id number]  Loletha Grayer, MD Inpatient   08/12/2018 2001 08/13/2018 1038 DNR [id number]  Gorden Harms, MD ED   08/12/2018 2001 08/12/2018 2001 Full Code [id number]  Gorden Harms, MD ED   06/15/2018 1635 06/18/2018 2009 Full Code [id number]  [name], MD Inpatient   02/24/2018 0347 02/27/2018 0027 Full Code [id number]  Amelia Jo, MD Inpatient   10/15/2017 1141 10/16/2017 2204 Full Code [id number]  Knox Royalty, NP Inpatient   10/13/2017 2246 10/15/2017 1141 DNR [id number]  Lance Coon, MD ED   10/06/2017 1454 10/09/2017 2058 DNR [id number]  Nicholes Mango, MD Inpatient   10/06/2017 1111 10/06/2017 1453 Full Code [id number]  Nicholes Mango, MD Inpatient   05/27/2017 1954 05/28/2017 2013 Full Code [id number]  Vaughan Basta, MD Inpatient   05/23/2017 2322 05/26/2017 1614 Full Code [id number]  Lance Coon, MD Inpatient   04/10/2017 0325 04/11/2017 1916 Full Code [id number]  Harrie Foreman, MD Inpatient   11/23/2016 0629 12/01/2016 1935 Full Code [id number]  Harrie Foreman, MD Inpatient   08/26/2016 0445 08/26/2016 1923 Full Code [id number]  Saundra Shelling, MD Inpatient   09/22/2015 1755 09/25/2015 1844 Full Code [id number]  Demetrios Loll, MD ED   09/15/2015 0341 09/17/2015 1741 Full Code  [id number]  Harrie Foreman, MD Inpatient   07/16/2015 0151 07/20/2015 2334 Full Code [id number]  Lance Coon, MD Inpatient   05/25/2015 0834 05/29/2015 1616 Full Code [id number]  Harrie Foreman, MD Inpatient   04/02/2015 0606 04/08/2015 1651 Full Code [id number]  Harrie Foreman, MD Inpatient      Prognosis:    POOR   Discharge Planning:  To Be Determined   The above conversation was completed via telephone due to the visitor restrictions during the COVID-19 pandemic. Thorough chart review and discussion with necessary members of the care team was completed as part of assessment.   Thank you for allowing the Palliative Medicine Team to assist in the care of this patient.   15 min

## 2019-03-10 NOTE — Plan of Care (Signed)
Patient agitated through out night; multiple doses of fentanyl and midazolam given.

## 2019-03-10 NOTE — Progress Notes (Signed)
CRITICAL CARE NOTE       SUBJECTIVE FINDINGS & SIGNIFICANT EVENTS    Patient remains critically ill Remains intubated on mechanical ventilation however is responsive to verbal communication with gesturing. Palliative care team is working closely with family about goals of care. Vent management today - on PS5 was unable to breathe on her own -Vt was only 250cc  SIGNIFICANT EVENTS 4/10 admitted for COPD exacerbation  4/12 discharged to home 4/13 re-admitted for COPD exacerbation, SVT 4/21 re-admitted to ICU for new RT sided pneumonia and severe resp failure, intubated #8 ETT 4/24 failed SAT/SBT 4/25 started on steroids for COPD and prevention of postt extubation stridor 4/27 -PALS meeting with family- possible terminal weaning Friday 4/29- Critical care provider discussed [place] with POA and updated on current care and condition - family planing on deecalation 5/1   PAST MEDICAL HISTORY   Past Medical History:  Diagnosis Date  . Acute deep vein thrombosis (DVT) of distal vein of left lower extremity ([place]) 10/03/2015  . Acute pulmonary embolism ([place]) 10/03/2015  . Anticoagulant long-term use   . Arthritis   . Arthritis   . CHF (congestive heart failure) ([place])   . Chronic anticoagulation 10/03/2015  . Collagen vascular disease ([place])   . Colostomy in place [facility]) 10/04/2015  . Deep venous thrombosis (HCC)    right lower extremity  . Dependent edema   . Diverticulosis of intestine with bleeding 04/02/2015  . Glenohumeral arthritis 06/28/2012  . Hammertoe 09/02/2012  . History of bilateral hip replacements 09/02/2012  . History of hysterectomy   . Hypertension   . Hypertension   . Hypokalemia   . Inability to ambulate due to hip 10/12/2015  . Mandibular dysfunction   . Onychomycosis 09/02/2012  .  Osteoarthritis   . Polycythemia vera ([place])   . Presence of IVC filter 05/25/2015  . Stroke ([place]) 03/26/2015   cerebellar  . Urinary incontinence in female 09/02/2016     SURGICAL HISTORY   Past Surgical History:  Procedure Laterality Date  . ABDOMINAL HYSTERECTOMY    . BLEPHAROPLASTY Right 03/2017   right upper eyelid  . CARDIAC CATHETERIZATION Right 04/03/2015   Procedure: CENTRAL LINE INSERTION;  Surgeon: Sherri Rad, MD;  Location: [facility] ORS;  Service: General;  Laterality: Right;  . COLECTOMY WITH COLOSTOMY CREATION/HARTMANN PROCEDURE N/A 04/03/2015   Procedure: COLECTOMY WITH COLOSTOMY CREATION/HARTMANN PROCEDURE;  Surgeon: Sherri Rad, MD;  Location: [facility] ORS;  Service: General;  Laterality: N/A;  . COLON SURGERY    . FOOT AMPUTATION     partial  . FOOT AMPUTATION Right   . IVC FILTER INSERTION    . JOINT REPLACEMENT     Left and Right Hip  . PERIPHERAL VASCULAR CATHETERIZATION N/A 04/02/2015   Procedure: Visceral Angiography;  Surgeon: Algernon Huxley, MD;  Location: [place] CV LAB;  Service: Cardiovascular;  Laterality: N/A;  . PERIPHERAL VASCULAR CATHETERIZATION N/A 04/02/2015   Procedure: Visceral Artery Intervention;  Surgeon: Algernon Huxley, MD;  Location: [place] CV LAB;  Service: Cardiovascular;  Laterality: N/A;  . PERIPHERAL VASCULAR CATHETERIZATION N/A 05/25/2015   Procedure: IVC Filter Insertion;  Surgeon: Katha Cabal, MD;  Location: [place] [facility] LAB;  Service: Cardiovascular;  Laterality: N/A;  . TOE AMPUTATION     right  . TOTAL HIP ARTHROPLASTY       FAMILY HISTORY   Family History  Problem Relation Age of Onset  . Brain cancer Mother   . Heart attack Father   .  COPD Father   . Coronary artery disease Father   . Hypertension Other   . Arthritis-Osteo Other   . Diabetes Brother   . Arthritis Brother   . Osteosarcoma Son   . Bone cancer Son   . Arthritis Sister   . Arthritis Brother   . Hypertension Brother   . Coronary artery disease  Brother   . Malignant hyperthermia Brother      SOCIAL HISTORY   Social History   Tobacco Use  . Smoking status: Never Smoker  . Smokeless tobacco: Never Used  Substance Use Topics  . Alcohol use: No  . Drug use: No     MEDICATIONS   Current Medication:  Current Facility-Administered Medications:  .  0.9 %  sodium chloride infusion, , Intravenous, PRN, Hillary Bow, MD, Stopped at 03/06/19 0016 .  0.9 %  sodium chloride infusion, 250 mL, Intravenous, Continuous, Flora Lipps, MD, Stopped at 03/02/19 1445 .  acetaminophen (TYLENOL) tablet 650 mg, 650 mg, Oral, Q6H PRN, 650 mg at 03/01/19 1200 **OR** acetaminophen (TYLENOL) suppository 650 mg, 650 mg, Rectal, Q6H PRN, Sudini, Srikar, MD .  aspirin chewable tablet 81 mg, 81 mg, Per Tube, Daily, Kasa, Kurian, MD, 81 mg at 03/09/19 1004 .  chlorhexidine gluconate (MEDLINE KIT) (PERIDEX) 0.12 % solution 15 mL, 15 mL, Mouth Rinse, BID, Kasa, Kurian, MD, 15 mL at 03/10/19 0748 .  dexmedetomidine (PRECEDEX) 400 MCG/100ML (4 mcg/mL) infusion, 0.4-1.2 mcg/kg/hr, Intravenous, Titrated, Epifanio Lesches, MD, Last Rate: 29.5 mL/hr at 03/10/19 0600, 1.2 mcg/kg/hr at 03/10/19 0600 .  enoxaparin (LOVENOX) injection 100 mg, 1 mg/kg, Subcutaneous, Q12H, Charlett Nose, RPH, 100 mg at 03/09/19 2150 .  feeding supplement (VITAL HIGH PROTEIN) liquid 1,000 mL, 1,000 mL, Per Tube, Continuous, Kasa, Kurian, MD, Last Rate: 50 mL/hr at 03/09/19 2004, 1,000 mL at 03/09/19 2004 .  fentaNYL (SUBLIMAZE) injection 25 mcg, 25 mcg, Intravenous, Q1H PRN, Flora Lipps, MD, 25 mcg at 03/10/19 0513 .  insulin aspart (novoLOG) injection 0-9 Units, 0-9 Units, Subcutaneous, Q4H, Flora Lipps, MD, 2 Units at 03/10/19 0748 .  ipratropium-albuterol (DUONEB) 0.5-2.5 (3) MG/3ML nebulizer solution 3 mL, 3 mL, Nebulization, Q4H PRN, Wilhelmina Mcardle, MD .  ipratropium-albuterol (DUONEB) 0.5-2.5 (3) MG/3ML nebulizer solution 3 mL, 3 mL, Nebulization, Q6H, Kasa, Kurian,  MD, 3 mL at 03/10/19 0735 .  lip balm (BLISTEX) ointment, , Topical, PRN, Darel Hong D, NP .  MEDLINE mouth rinse, 15 mL, Mouth Rinse, 10 times per day, Flora Lipps, MD, 15 mL at 03/10/19 0549 .  metoprolol tartrate (LOPRESSOR) injection 2.5 mg, 2.5 mg, Intravenous, Q8H PRN, Tyler Pita, MD .  midazolam (VERSED) injection 1 mg, 1 mg, Intravenous, Q2H PRN, Darel Hong D, NP, 1 mg at 03/10/19 2423 .  multivitamin liquid 15 mL, 15 mL, Per Tube, Daily, Kasa, Kurian, MD, 15 mL at 03/09/19 1004 .  [DISCONTINUED] ondansetron (ZOFRAN) tablet 4 mg, 4 mg, Oral, Q6H PRN **OR** ondansetron (ZOFRAN) injection 4 mg, 4 mg, Intravenous, Q6H PRN, Sudini, Srikar, MD .  pantoprazole sodium (PROTONIX) 40 mg/20 mL oral suspension 40 mg, 40 mg, Per Tube, Daily, Kasa, Kurian, MD, 40 mg at 03/09/19 1004 .  polyethylene glycol (MIRALAX / GLYCOLAX) packet 17 g, 17 g, Oral, Daily PRN, Sudini, Srikar, MD .  senna-docusate (Senokot-S) tablet 1 tablet, 1 tablet, Oral, BID, Flora Lipps, MD, 1 tablet at 03/09/19 2150 .  sodium chloride flush (NS) 0.9 % injection 10-40 mL, 10-40 mL, Intracatheter, Q12H, Darel Hong D, NP, 30 mL  at 03/09/19 2151 .  sodium chloride flush (NS) 0.9 % injection 10-40 mL, 10-40 mL, Intracatheter, PRN, Darel Hong D, NP .  sodium chloride flush (NS) 0.9 % injection 3 mL, 3 mL, Intravenous, Q12H, Sudini, Srikar, MD, 3 mL at 03/09/19 2151    ALLERGIES   Patient has no known allergies.    REVIEW OF SYSTEMS     ROS unable to obtain due to sedation on MV  PHYSICAL EXAMINATION   Vitals:   03/10/19 0500 03/10/19 0600  BP: (!) 115/102 108/66  Pulse: (!) 137 (!) 55  Resp: 13 17  Temp:    SpO2: 92% 100%    GENERAL:RASS -2 HEAD: Normocephalic, atraumatic.  EYES: Pupils equal, round, reactive to light.  No scleral icterus.  MOUTH: Moist mucosal membrane. NECK: Supple. No thyromegaly. No nodules. No JVD.  PULMONARY: Mild bibasilar crackles.  CARDIOVASCULAR: S1 and  S2. Regular rate and rhythm. No murmurs, rubs, or gallops.  GASTROINTESTINAL: Soft, nontender, non-distended. No masses. Positive bowel sounds. No hepatosplenomegaly.  MUSCULOSKELETAL: No swelling, clubbing, or  Worse peripheral edema this am  NEUROLOGIC: Mild distress due to acute illness SKIN:intact,warm,dry   LABS AND IMAGING     LAB RESULTS: Recent Labs  Lab 03/08/19 0425 03/09/19 0438 03/10/19 0500  NA 140 142 142  K 4.5 3.9 4.1  CL 107 108 108  CO2 _0 BUN 29* 29* 31*  CREATININE 0.44 0.46 0.39*  GLUCOSE 166* 145* 151*   Recent Labs  Lab 03/08/19 0425 03/09/19 0438 03/10/19 0500  HGB 8.6* 8.6* 7.6*  HCT 31.1* 31.0* 27.1*  WBC 12.9* 13.7* 11.5*  PLT 302 348 319     IMAGING RESULTS: No results found.    ASSESSMENT AND PLAN     Acute Hypoxic Respiratory Failure -History of pulmonary venous thromboembolism and COPD  -worsening peripheral edema and pulmonary edema - will gently diurese today due to marginal BP -Status post treatment for community-acquired pneumonia-completed 7-day course -continue Full MV support -s/p discussion with family yesterday - terminal weaning planned for 03/11/19    NEUROLOGY - intubated and sedated - minimal sedation to achieve a RASS goal: -1 Wake up assessment pending   ID -continue IV abx as prescibed -follow up cultures  GI/Nutrition GI PROPHYLAXIS as indicated DIET-->TF's as tolerated Constipation protocol as indicated  ENDO - ICU hypoglycemic\Hyperglycemia protocol -check FSBS per protocol   ELECTROLYTES -follow labs as needed -replace as needed -pharmacy consultation   DVT/GI PRX ordered -SCDs  TRANSFUSIONS AS NEEDED MONITOR FSBS ASSESS the need for LABS as needed   Critical care provider statement:  Critical care time (minutes):31 Critical care time was exclusive of: Separately billable procedures and treating other patients Critical care was necessary to  treat or prevent imminent or life-threatening deterioration of the following conditions:Acute hypoxemic respiratory failure,multiple comorbid conditions Critical care was time spent personally by me on the following activities: Development of treatment plan with patient or surrogate, discussions with consultants, evaluation of patient's response to treatment, examination of patient, obtaining history from patient or surrogate, ordering and performing treatments and interventions, ordering and review of laboratory studies and re-evaluation of patient's condition. I assumed direction of critical care for this patient from another provider in my specialty: no   This document was prepared using Dragon voice recognition software and may include unintentional dictation errors.    Ottie Glazier, M.D.  Division of [place]

## 2019-03-10 NOTE — Progress Notes (Addendum)
Pharmacy Electrolyte Monitoring Consult:  Pharmacy consulted to assist in monitoring and replacing electrolytes in this 85 y.o. female admitted on 02/19/2019 with Altered Mental Status and Shortness of Breath   Labs:  Sodium (mmol/L)  Date Value  03/10/2019 142  04/10/2015 137  07/08/2014 144   Potassium (mmol/L)  Date Value  03/10/2019 4.1  07/08/2014 3.7   Magnesium (mg/dL)  Date Value  03/08/2019 2.1  05/08/2014 2.6 (H)   Phosphorus (mg/dL)  Date Value  03/07/2019 3.0   Calcium (mg/dL)  Date Value  03/10/2019 8.5 (L)   Calcium, Total (mg/dL)  Date Value  07/08/2014 8.4 (L)   Albumin (g/dL)  Date Value  02/12/2019 3.1 (L)  07/08/2014 2.9 (L)     Assessment/Plan: Will receive furosemide 20 mg IV x 1 and albumin 25%.  No supplementation needed at this time.   Will replace for goal potassium ~ 4 and goal magnesium ~ 2.   Pharmacy will continue to monitor and adjust per consult.    Paticia Stack, PharmD Pharmacy Resident  03/10/2019 11:30 AM

## 2019-03-10 NOTE — Progress Notes (Signed)
[facility] [place] NAME: Yolanda Wells    MR#:  [id number]  DATE OF BIRTH:  Mar 25, 1932    CHIEF COMPLAINT:   Chief Complaint  Patient presents with  . Altered Mental Status  . Shortness of Breath   Patient is on full ventilatory support.   Agitated overnight   REVIEW OF SYSTEMS:  Patient is intubated  DRUG ALLERGIES:  No Known Allergies  VITALS:  Blood pressure 123/72, pulse 74, temperature 99.1 F (37.3 C), temperature source Oral, resp. rate (!) 29, height 5' 2.01" (1.575 m), weight 101.9 kg, SpO2 100 %.  PHYSICAL EXAMINATION:   Physical Exam  GENERAL:  85 y.o.-year-old patient lying in the bed, obese. Critically ill EYES: Pupils equal, round, reactive to light and accommodation. No scleral icterus. Extraocular muscles intact.  HEENT: Head atraumatic, normocephalic. Oropharynx and nasopharynx clear.  NECK:  Supple, no jugular venous distention. No thyroid enlargement, no tenderness.  LUNGS: Decrease breath sounds bilatera CARDIOVASCULAR: S1, S2 normal. No murmurs, rubs, or gallops.  ABDOMEN: Soft, nontender, nondistended. Bowel sounds present. No organomegaly or mass.  EXTREMITIES: No cyanosis, clubbing. LE edema. PSYCHIATRIC: Could not be assessed  LABORATORY PANEL:   CBC Recent Labs  Lab 03/10/19 0500  WBC 11.5*  HGB 7.6*  HCT 27.1*  PLT 319   ------------------------------------------------------------------------------------------------------------------ Chemistries  Recent Labs  Lab 03/08/19 0425  03/10/19 0500  NA 140   < > 142  K 4.5   < > 4.1  CL 107   < > 108  CO2 27   < > 28  GLUCOSE 166*   < > 151*  BUN 29*   < > 31*  CREATININE 0.44   < > 0.39*  CALCIUM 8.5*   < > 8.5*  MG 2.1  --   --    < > = values in this interval not displayed.   ------------------------------------------------------------------------------------------------------------------  Cardiac Enzymes No results for  input(s): TROPONINI in the last 168 hours. ------------------------------------------------------------------------------------------------------------------  RADIOLOGY:  Dg Chest Port 1 View  Result Date: 03/10/2019 CLINICAL DATA:  Respiratory failure. EXAM: PORTABLE CHEST 1 VIEW COMPARISON:  03/09/2019 FINDINGS: Endotracheal tube remains with the tip approximately 3 cm above the carina. Stable cardiac enlargement. Gastric decompression tube extends into the stomach. Fairly stable bilateral lower lobe consolidation. There may remain small bilateral pleural effusions. No overt edema or evidence of pneumothorax. Enlarged main pulmonary artery contour. IMPRESSION: Stable bilateral lower lobe consolidation with probable small bilateral pleural effusions. Electronically Signed   By: Aletta Edouard M.D.   On: 03/10/2019 10:27   Dg Chest Port 1 View  Result Date: 03/09/2019 CLINICAL DATA:  Acute respiratory failure. EXAM: PORTABLE CHEST 1 VIEW COMPARISON:  One-view chest x-ray 03/01/2019 FINDINGS: Endotracheal tube terminates 2.5 cm above the carina. Heart is mildly enlarged. Mitral annular calcifications are again seen. Aortic atherosclerosis is present. NG tube courses off the inferior border the film. Progressive bibasilar airspace disease is present, right greater than left. Bilateral effusions are suspected. IMPRESSION: 1. Increasing bilateral lower lobe airspace disease/pneumonia. 2. Suspect bilateral pleural effusions. 3. Endotracheal tube has advanced, now terminating 2.5 cm above the carina. Electronically Signed   By: San Morelle M.D.   On: 03/09/2019 07:38     ASSESSMENT AND PLAN:   * Acute on chronic hypoxic hypercarbic respiratory failure failure, COPD exacerbation,, pneumonia, treated with IV antibiotics vancomycin, cefepime., bronchodilators, wean as tolerated.  Finish steroid course.   * Pulm HTN, COPD and now PNA  On mechanical ventilator Intensivist follow-up Continue vent  bundle SBT later today  * RLL HCAP Treated with IV vancomycin and cefepime Finished course  *Hypotension Improved  * Acute metabolic encephalopathy secondary to hypercarbia.  Has baseline dementia. On ventilator at this time Has NIV set up at home.  * Atrial  fibrillation with RVR, SVT On therapeutic dose of Lovenox Unable to use scheduled rate control medications due to hypotension  *COPD exacerbation-improved Continue nebulization therapy Treated with IV steroids and now stopped  *DVT prophylaxis Lovenox  * Dementia, agitation, continue Seroquel, Ativan. Palliative care consulted and following.  All the records are reviewed and case discussed with Care Management/Social Worker Management plans discussed with the patient, family and they are in agreement.  CODE STATUS: FULL CODE  TOTAL TIME TAKING CARE OF THIS PATIENT: 35 minutes.   Plan is for comfort care if no improvement by Friday  [name] Meghanne Pletz M.D on 03/10/2019 at 12:17 PM  Between 7am to 6pm - Pager - [phone]  After 6pm go to [email or web address] - password [email or web address] [facility]  Office  [phone]  CC: Primary care physician; [name], MD  Note: This dictation was prepared with Dragon dictation along with smaller phrase technology. Any transcriptional errors that result from this process are unintentional.

## 2019-03-10 NOTE — Progress Notes (Signed)
   03/10/19 2000  Clinical Encounter Type  Visited With Patient  Visit Type Follow-up;Spiritual support  Spiritual Encounters  Spiritual Needs Prayer

## 2019-03-11 ENCOUNTER — Inpatient Hospital Stay: Payer: Medicare Other

## 2019-03-11 LAB — CBC WITH DIFFERENTIAL/PLATELET
Abs Immature Granulocytes: 0.84 10*3/uL — ABNORMAL HIGH (ref 0.00–0.07)
Basophils Absolute: 0 10*3/uL (ref 0.0–0.1)
Basophils Relative: 0 %
Eosinophils Absolute: 0 10*3/uL (ref 0.0–0.5)
Eosinophils Relative: 0 %
HCT: 27.3 % — ABNORMAL LOW (ref 36.0–46.0)
Hemoglobin: 7.5 g/dL — ABNORMAL LOW (ref 12.0–15.0)
Immature Granulocytes: 7 %
Lymphocytes Relative: 6 %
Lymphs Abs: 0.7 10*3/uL (ref 0.7–4.0)
MCH: 23.7 pg — ABNORMAL LOW (ref 26.0–34.0)
MCHC: 27.5 g/dL — ABNORMAL LOW (ref 30.0–36.0)
MCV: 86.4 fL (ref 80.0–100.0)
Monocytes Absolute: 0.1 10*3/uL (ref 0.1–1.0)
Monocytes Relative: 1 %
Neutro Abs: 9.7 10*3/uL — ABNORMAL HIGH (ref 1.7–7.7)
Neutrophils Relative %: 86 %
Platelets: 335 10*3/uL (ref 150–400)
RBC: 3.16 MIL/uL — ABNORMAL LOW (ref 3.87–5.11)
RDW: 28.7 % — ABNORMAL HIGH (ref 11.5–15.5)
WBC: 11.4 10*3/uL — ABNORMAL HIGH (ref 4.0–10.5)
nRBC: 4.5 % — ABNORMAL HIGH (ref 0.0–0.2)

## 2019-03-11 LAB — BASIC METABOLIC PANEL
Anion gap: 6 (ref 5–15)
BUN: 30 mg/dL — ABNORMAL HIGH (ref 8–23)
CO2: 29 mmol/L (ref 22–32)
Calcium: 8.5 mg/dL — ABNORMAL LOW (ref 8.9–10.3)
Chloride: 108 mmol/L (ref 98–111)
Creatinine, Ser: 0.48 mg/dL (ref 0.44–1.00)
GFR calc Af Amer: >60 mL/min (ref >60)
GFR calc non Af Amer: >60 mL/min (ref >60)
Glucose, Bld: 180 mg/dL — ABNORMAL HIGH (ref 70–99)
Potassium: 4.2 mmol/L (ref 3.5–5.1)
Sodium: 143 mmol/L (ref 135–145)

## 2019-03-11 LAB — GLUCOSE, CAPILLARY
Glucose-Capillary: 158 mg/dL — ABNORMAL HIGH (ref 70–99)
Glucose-Capillary: 189 mg/dL — ABNORMAL HIGH (ref 70–99)
Glucose-Capillary: 175 mg/dL — ABNORMAL HIGH (ref 70–99)
Glucose-Capillary: 139 mg/dL — ABNORMAL HIGH (ref 70–99)
Glucose-Capillary: 114 mg/dL — ABNORMAL HIGH (ref 70–99)

## 2019-03-11 MED ORDER — ORAL CARE MOUTH RINSE
15.0000 mL | Freq: Two times a day (BID) | OROMUCOSAL | Status: DC
  Administered 2019-03-11 – 2019-03-14 (×7): 15 mL via OROMUCOSAL

## 2019-03-11 MED ORDER — ASPIRIN 81 MG PO CHEW
81.0000 mg | CHEWABLE_TABLET | Freq: Every day | ORAL | Status: DC
  Administered 2019-03-12 – 2019-03-13 (×2): 81 mg via ORAL
  Filled 2019-03-11 (×2): qty 1

## 2019-03-11 MED ORDER — METOPROLOL TARTRATE 5 MG/5ML IV SOLN
2.5000 mg | Freq: Four times a day (QID) | INTRAVENOUS | Status: DC | PRN
  Administered 2019-03-11 – 2019-03-12 (×3): 2.5 mg via INTRAVENOUS
  Filled 2019-03-11 (×3): qty 5

## 2019-03-11 MED ORDER — PANTOPRAZOLE SODIUM 40 MG PO TBEC
40.0000 mg | DELAYED_RELEASE_TABLET | Freq: Every day | ORAL | Status: DC
  Administered 2019-03-12 – 2019-03-13 (×2): 40 mg via ORAL
  Filled 2019-03-11 (×2): qty 1

## 2019-03-11 MED ORDER — CHLORHEXIDINE GLUCONATE 0.12 % MT SOLN
15.0000 mL | Freq: Two times a day (BID) | OROMUCOSAL | Status: DC
  Administered 2019-03-11 – 2019-03-15 (×8): 15 mL via OROMUCOSAL
  Filled 2019-03-11 (×4): qty 15

## 2019-03-11 NOTE — Progress Notes (Signed)
CRITICAL CARE NOTE       SUBJECTIVE FINDINGS & SIGNIFICANT EVENTS   Patient remains critically ill Prognosis is guarded For SBT today , hopefully will be able to terminally wean as per family for now they want her intubated.   Patient is very uncomfortable and gesturing to remove ETT.  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/04/2015   Deep venous thrombosis ([place])    right lower extremity   Dependent edema    Diverticulosis of intestine with bleeding 04/02/2015   Glenohumeral arthritis 06/28/2012   Hammertoe 09/02/2012   History of bilateral hip replacements 09/02/2012   History of hysterectomy    Hypertension    Hypertension    Hypokalemia    Inability to ambulate due to hip 10/12/2015   Mandibular dysfunction    Onychomycosis 09/02/2012   Osteoarthritis    Polycythemia vera ([place])    Presence of IVC filter 05/25/2015   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   Urinary incontinence in female 09/02/2016     SURGICAL HISTORY   Past Surgical History:  Procedure Laterality Date   ABDOMINAL HYSTERECTOMY     BLEPHAROPLASTY Right 03/2017   right upper eyelid   CARDIAC CATHETERIZATION Right 04/03/2015   Procedure: CENTRAL LINE INSERTION;  Surgeon: Sherri Rad, MD;  Location: [facility] ORS;  Service: General;  Laterality: Right;   COLECTOMY WITH COLOSTOMY  CREATION/HARTMANN PROCEDURE N/A 04/03/2015   Procedure: COLECTOMY WITH COLOSTOMY CREATION/HARTMANN PROCEDURE;  Surgeon: Sherri Rad, MD;  Location: [facility] ORS;  Service: General;  Laterality: N/A;   COLON SURGERY     FOOT AMPUTATION     partial   FOOT AMPUTATION Right    IVC FILTER INSERTION     JOINT REPLACEMENT     Left and Right Hip   PERIPHERAL VASCULAR CATHETERIZATION N/A 04/02/2015   Procedure: Visceral Angiography;  Surgeon: Algernon Huxley, MD;  Location: [facility];  Service: Cardiovascular;  Laterality: N/A;   PERIPHERAL VASCULAR CATHETERIZATION N/A 04/02/2015   Procedure: Visceral Artery Intervention;  Surgeon: Algernon Huxley, MD;  Location: [facility];  Service: Cardiovascular;  Laterality: N/A;   PERIPHERAL VASCULAR CATHETERIZATION N/A 05/25/2015   Procedure: IVC Filter Insertion;  Surgeon: Katha Cabal, MD;  Location: [facility];  Service: Cardiovascular;  Laterality: N/A;   TOE AMPUTATION     right   TOTAL HIP ARTHROPLASTY       FAMILY HISTORY   Family History  Problem Relation Age of Onset   Brain cancer Mother    Heart attack Father    COPD Father    Coronary artery disease Father    Hypertension Other    Arthritis-Osteo Other    Diabetes Brother    Arthritis Brother    Osteosarcoma Son    Bone cancer Son    Arthritis Sister    Arthritis Brother    Hypertension Brother    Coronary artery disease Brother    Malignant hyperthermia Brother      SOCIAL HISTORY  Social History   Tobacco Use   Smoking status: Never Smoker   Smokeless tobacco: Never Used  Substance Use Topics   Alcohol use: No   Drug use: No     MEDICATIONS   Current Medication:  Current Facility-Administered Medications:    0.9 %  sodium chloride infusion, , Intravenous, PRN, Hillary Bow, MD, Stopped at 03/06/19 0016   0.9 %  sodium chloride infusion, 250 mL, Intravenous, Continuous, Flora Lipps, MD, Stopped at 03/02/19  1445   acetaminophen (TYLENOL) tablet 650 mg, 650 mg, Oral, Q6H PRN, 650 mg at 03/01/19 1200 **OR** acetaminophen (TYLENOL) suppository 650 mg, 650 mg, Rectal, Q6H PRN, Sudini, Srikar, MD   aspirin chewable tablet 81 mg, 81 mg, Per Tube, Daily, Kasa, Maretta Bees, MD, 81 mg at 03/10/19 1031   chlorhexidine gluconate (MEDLINE KIT) (PERIDEX) 0.12 % solution 15 mL, 15 mL, Mouth Rinse, BID, Mortimer Fries, Kurian, MD, 15 mL at 03/11/19 0803   dexmedetomidine (PRECEDEX) 400 MCG/100ML (4 mcg/mL) infusion, 0.4-1.2 mcg/kg/hr, Intravenous, Titrated, Epifanio Lesches, MD, Last Rate: 24.6 mL/hr at 03/11/19 0814, 1 mcg/kg/hr at 03/11/19 0814   enoxaparin (LOVENOX) injection 100 mg, 1 mg/kg, Subcutaneous, Q12H, Charlett Nose, RPH, 100 mg at 03/10/19 2121   feeding supplement (VITAL HIGH PROTEIN) liquid 1,000 mL, 1,000 mL, Per Tube, Continuous, Flora Lipps, MD, Last Rate: 50 mL/hr at 03/10/19 1604, 1,000 mL at 03/10/19 1604   fentaNYL (SUBLIMAZE) injection 25 mcg, 25 mcg, Intravenous, Q1H PRN, Flora Lipps, MD, 25 mcg at 03/10/19 2119   insulin aspart (novoLOG) injection 0-9 Units, 0-9 Units, Subcutaneous, Q4H, Kasa, Maretta Bees, MD, 2 Units at 03/11/19 0804   ipratropium-albuterol (DUONEB) 0.5-2.5 (3) MG/3ML nebulizer solution 3 mL, 3 mL, Nebulization, Q4H PRN, Wilhelmina Mcardle, MD   ipratropium-albuterol (DUONEB) 0.5-2.5 (3) MG/3ML nebulizer solution 3 mL, 3 mL, Nebulization, Q6H, Kasa, Kurian, MD, 3 mL at 03/11/19 0712   lip balm (BLISTEX) ointment, , Topical, PRN, Darel Hong D, NP   MEDLINE mouth rinse, 15 mL, Mouth Rinse, 10 times per day, Flora Lipps, MD, 15 mL at 03/11/19 0553   methylPREDNISolone sodium succinate (SOLU-MEDROL) 40 mg/mL injection 40 mg, 40 mg, Intravenous, Q12H, Blakeney, Dreama Saa, NP, 40 mg at 03/11/19 0258   metoprolol tartrate (LOPRESSOR) injection 2.5 mg, 2.5 mg, Intravenous, Q8H PRN, Tyler Pita, MD   midazolam (VERSED) injection 1 mg, 1 mg, Intravenous, Q2H PRN, Darel Hong D, NP, 1 mg at 03/10/19 2119   multivitamin liquid 15 mL, 15 mL, Per Tube, Daily, Flora Lipps, MD, 15 mL at 03/10/19 1032   [DISCONTINUED] ondansetron (ZOFRAN) tablet 4 mg, 4 mg, Oral, Q6H PRN **OR** ondansetron (ZOFRAN) injection 4 mg, 4 mg, Intravenous, Q6H PRN, Sudini, Srikar, MD   pantoprazole sodium (PROTONIX) 40 mg/20 mL oral suspension 40 mg, 40 mg, Per Tube, Daily, Mortimer Fries, Maretta Bees, MD, 40 mg at 03/10/19 1032   polyethylene glycol (MIRALAX / GLYCOLAX) packet 17 g, 17 g, Oral, Daily PRN, Hillary Bow, MD   senna-docusate (Senokot-S) tablet 1 tablet, 1 tablet, Oral, BID, Flora Lipps, MD, 1 tablet at 03/10/19 2122   sodium chloride flush (NS) 0.9 % injection 10-40 mL, 10-40 mL, Intracatheter, Q12H, Darel Hong D, NP, 20 mL at 03/11/19 0814   sodium chloride flush (NS) 0.9 % injection 10-40 mL, 10-40 mL, Intracatheter, PRN, Darel Hong D, NP   sodium chloride flush (NS) 0.9 % injection 3 mL, 3 mL, Intravenous, Q12H, Sudini, Srikar, MD, 3 mL at 03/10/19 2122    ALLERGIES   Patient has  no known allergies.    REVIEW OF SYSTEMS    10 point ROS unable to obtain due to MV and sedation  PHYSICAL EXAMINATION   Vitals:   03/11/19 0713 03/11/19 0800  BP:  114/62  Pulse:  (!) 58  Resp:  14  Temp:  98.6 F (37 C)  SpO2: 100% 100%    GENERAL:sedated on mechanical ventilation  HEAD: Normocephalic, atraumatic.  EYES: Pupils equal, round, reactive to light.  No scleral icterus.  MOUTH: Moist mucosal membrane. NECK: Supple. No thyromegaly. No nodules. No JVD.  PULMONARY: mild ronchi b/e CARDIOVASCULAR: S1 and S2. Regular rate and rhythm. No murmurs, rubs, or gallops.  GASTROINTESTINAL: Soft, nontender, non-distended. No masses. Positive bowel sounds. No hepatosplenomegaly.  MUSCULOSKELETAL: No swelling, clubbing, or edema.  NEUROLOGIC: Mild distress due to acute illness SKIN:intact,warm,dry   LABS AND IMAGING     LAB RESULTS: Recent Labs  Lab  03/09/19 0438 03/10/19 0500 03/11/19 0402  NA 142 142 143  K 3.9 4.1 4.2  CL 108 108 108  CO2 '27 28 29  '$ BUN 29* 31* 30*  CREATININE 0.46 0.39* 0.48  GLUCOSE 145* 151* 180*   Recent Labs  Lab 03/09/19 0438 03/10/19 0500 03/11/19 0402  HGB 8.6* 7.6* 7.5*  HCT 31.0* 27.1* 27.3*  WBC 13.7* 11.5* 11.4*  PLT 348 319 335     IMAGING RESULTS: Dg Chest Port 1 View  Result Date: 03/11/2019 CLINICAL DATA:  Respiratory failure.  Ventilator support. EXAM: PORTABLE CHEST 1 VIEW COMPARISON:  03/10/2019 FINDINGS: Endotracheal tube tip is 3 cm above the carina. Orogastric or nasogastric tube enters the stomach. Persistent bilateral lower lobe atelectasis and or pneumonia. The upper lungs remain clear. No significant change since yesterday. IMPRESSION: No change since yesterday. Endotracheal tube and orogastric tube well positioned. Persistent bilateral lower lobe atelectasis and or. Electronically Signed   By: Nelson Chimes M.D.   On: 03/11/2019 08:09   Dg Chest Port 1 View  Result Date: 03/10/2019 CLINICAL DATA:  Respiratory failure. EXAM: PORTABLE CHEST 1 VIEW COMPARISON:  03/09/2019 FINDINGS: Endotracheal tube remains with the tip approximately 3 cm above the carina. Stable cardiac enlargement. Gastric decompression tube extends into the stomach. Fairly stable bilateral lower lobe consolidation. There may remain small bilateral pleural effusions. No overt edema or evidence of pneumothorax. Enlarged main pulmonary artery contour. IMPRESSION: Stable bilateral lower lobe consolidation with probable small bilateral pleural effusions. Electronically Signed   By: Aletta Edouard M.D.   On: 03/10/2019 10:27      ASSESSMENT AND PLAN    Acute Hypoxic Respiratory Failure -History of pulmonary venous thromboembolism and COPD  -worsening peripheral edema and pulmonary edema - will gently diurese today due to marginal BP -Status post treatment for community-acquired pneumonia-completed 7-day  course -continue Full MV support -s/p discussion with family yesterday - terminal weaning planned for 03/11/19 -stopping tube feeds for potential extubation  - on low dose precedex   NEUROLOGY - intubated and sedated - minimal sedation to achieve a RASS goal: -1 Wake up assessment pending   ID -continue IV abx as prescibed -follow up cultures  GI/Nutrition GI PROPHYLAXIS as indicated DIET-->TF's as tolerated Constipation protocol as indicated  ENDO - ICU hypoglycemic\Hyperglycemia protocol -check FSBS per protocol   ELECTROLYTES -follow labs as needed -replace as needed -pharmacy consultation   DVT/GI PRX ordered -SCDs  TRANSFUSIONS AS NEEDED MONITOR FSBS ASSESS the need for LABS as needed   Critical care provider statement:  Critical care time (minutes):31 Critical care time  was exclusive of: Separately billable procedures and treating other patients Critical care was necessary to treat or prevent imminent or life-threatening deterioration of the following conditions:Acute hypoxemic respiratory failure,multiple comorbid conditions Critical care was time spent personally by me on the following activities: Development of treatment plan with patient or surrogate, discussions with consultants, evaluation of patient's response to treatment, examination of patient, obtaining history from patient or surrogate, ordering and performing treatments and interventions, ordering and review of laboratory studies and re-evaluation of patient's condition. I assumed direction of critical care for this patient from another provider in my specialty: no   This document was prepared using Dragon voice recognition software and may include unintentional dictation errors.    Ottie Glazier, M.D.  Division of [place]

## 2019-03-11 NOTE — Progress Notes (Signed)
[facility] [place] NAME: Yolanda Wells    MR#:  [id number]  DATE OF BIRTH:  [birth year]    CHIEF COMPLAINT:   Chief Complaint  Patient presents with  . Altered Mental Status  . Shortness of Breath   Patient is on full ventilatory support.   Awake  REVIEW OF SYSTEMS:  Patient is intubated  DRUG ALLERGIES:  No Known Allergies  VITALS:  Blood pressure (!) 144/84, pulse (!) 113, temperature 98.6 F (37 C), temperature source Oral, resp. rate (!) 32, height 5' 2.01" (1.575 m), weight 101.9 kg, SpO2 94 %.  PHYSICAL EXAMINATION:   Physical Exam  GENERAL:  85 y.o.-year-old patient lying in the bed, obese. Critically ill EYES: Pupils equal, round, reactive to light and accommodation. No scleral icterus. Extraocular muscles intact.  HEENT: Head atraumatic, normocephalic. Oropharynx and nasopharynx clear.  NECK:  Supple, no jugular venous distention. No thyroid enlargement, no tenderness.  LUNGS: Decrease breath sounds bilatera CARDIOVASCULAR: S1, S2 normal. No murmurs, rubs, or gallops.  ABDOMEN: Soft, nontender, nondistended. Bowel sounds present. No organomegaly or mass.  EXTREMITIES: No cyanosis, clubbing. LE edema. PSYCHIATRIC: Could not be assessed  LABORATORY PANEL:   CBC Recent Labs  Lab 03/11/19 0402  WBC 11.4*  HGB 7.5*  HCT 27.3*  PLT 335   ------------------------------------------------------------------------------------------------------------------ Chemistries  Recent Labs  Lab 03/08/19 0425  03/11/19 0402  NA 140   < > 143  K 4.5   < > 4.2  CL 107   < > 108  CO2 27   < > 29  GLUCOSE 166*   < > 180*  BUN 29*   < > 30*  CREATININE 0.44   < > 0.48  CALCIUM 8.5*   < > 8.5*  MG 2.1  --   --    < > = values in this interval not displayed.   ------------------------------------------------------------------------------------------------------------------  Cardiac Enzymes No results for input(s):  TROPONINI in the last 168 hours. ------------------------------------------------------------------------------------------------------------------  RADIOLOGY:  Dg Chest Port 1 View  Result Date: 03/11/2019 CLINICAL DATA:  Respiratory failure.  Ventilator support. EXAM: PORTABLE CHEST 1 VIEW COMPARISON:  03/10/2019 FINDINGS: Endotracheal tube tip is 3 cm above the carina. Orogastric or nasogastric tube enters the stomach. Persistent bilateral lower lobe atelectasis and or pneumonia. The upper lungs remain clear. No significant change since yesterday. IMPRESSION: No change since yesterday. Endotracheal tube and orogastric tube well positioned. Persistent bilateral lower lobe atelectasis and or. Electronically Signed   By: Nelson Chimes M.D.   On: 03/11/2019 08:09   Dg Chest Port 1 View  Result Date: 03/10/2019 CLINICAL DATA:  Respiratory failure. EXAM: PORTABLE CHEST 1 VIEW COMPARISON:  03/09/2019 FINDINGS: Endotracheal tube remains with the tip approximately 3 cm above the carina. Stable cardiac enlargement. Gastric decompression tube extends into the stomach. Fairly stable bilateral lower lobe consolidation. There may remain small bilateral pleural effusions. No overt edema or evidence of pneumothorax. Enlarged main pulmonary artery contour. IMPRESSION: Stable bilateral lower lobe consolidation with probable small bilateral pleural effusions. Electronically Signed   By: Aletta Edouard M.D.   On: 03/10/2019 10:27     ASSESSMENT AND PLAN:   * Acute on chronic hypoxic hypercarbic respiratory failure failure, COPD exacerbation,, pneumonia, treated with IV antibiotics vancomycin, cefepime., bronchodilators, wean as tolerated.  Finish steroid course.   * Pulm HTN, COPD and now PNA On mechanical ventilator Intensivist follow-up Continue vent bundle SBT later today  * RLL HCAP Treated with IV  vancomycin and cefepime Finished course  *Hypotension Improved  * Acute metabolic encephalopathy  secondary to hypercarbia.  Has baseline dementia. On ventilator at this time Has NIV set up at home.  * Atrial  fibrillation with RVR, SVT On therapeutic dose of Lovenox Unable to use scheduled rate control medications due to hypotension  *COPD exacerbation-improved Continue nebulization therapy Treated with IV steroids and now stopped  *DVT prophylaxis Lovenox  * Dementia, agitation, continue Seroquel, Ativan. Palliative care consulted and following.   Plan is to extubate likely tomorrow and not reintubate.  Family meeting to decide.  All the records are reviewed and case discussed with Care Management/Social Worker Management plans discussed with the patient, family and they are in agreement.  CODE STATUS: FULL CODE  TOTAL TIME TAKING CARE OF THIS PATIENT: 35 minutes.   Plan is for comfort care if no improvement by Friday  [name] Janvi Ammar M.D on 03/11/2019 at 11:15 AM  Between 7am to 6pm - Pager - [phone]  After 6pm go to [email or web address] - password [email or web address] [facility]  Office  [phone]  CC: Primary care physician; [name], MD  Note: This dictation was prepared with Dragon dictation along with smaller phrase technology. Any transcriptional errors that result from this process are unintentional.

## 2019-03-11 NOTE — Plan of Care (Signed)
Pt with VSS stable overnight. She rested throughout the night.  Family meeting again today to discuss goals of care

## 2019-03-11 NOTE — Progress Notes (Signed)
Pastoral Care Visit   03/11/19 1030  Clinical Encounter Type  Visited With Family;Patient and family together;Health care provider  Visit Type Follow-up;Psychological support;Patient actively dying;Other (Comment) (MD consult w/ fam)  Referral From Family  Consult/Referral To Chaplain  Spiritual Encounters  Spiritual Needs Emotional  Stress Factors  Patient Stress Factors Health changes  Family Stress Factors Health changes   Met w/ pt's family and provider for family meeting regarding extubation and comfort care.  Spent several hours with family helping them cope with the ethical decisions regarding care decisions.  Family is present and supportive of pt.  This chap believes they have now turned the corner to accept comfort care instead of life saving care.  Pt's son, grandson, granddaughter, and ?? Have visited today.  Darcey Nora, Chaplain

## 2019-03-11 NOTE — Progress Notes (Signed)
Extubated without complications to 2lnc 

## 2019-03-11 NOTE — Progress Notes (Signed)
Pharmacy Electrolyte Monitoring Consult:  Pharmacy consulted to assist in monitoring and replacing electrolytes in this 85 y.o. female admitted on 02/15/2019 with Altered Mental Status and Shortness of Breath   Labs:  Sodium (mmol/L)  Date Value  03/11/2019 143  04/10/2015 137  07/08/2014 144   Potassium (mmol/L)  Date Value  03/11/2019 4.2  07/08/2014 3.7   Magnesium (mg/dL)  Date Value  03/08/2019 2.1  05/08/2014 2.6 (H)   Phosphorus (mg/dL)  Date Value  03/07/2019 3.0   Calcium (mg/dL)  Date Value  03/11/2019 8.5 (L)   Calcium, Total (mg/dL)  Date Value  07/08/2014 8.4 (L)   Albumin (g/dL)  Date Value  02/12/2019 3.1 (L)  07/08/2014 2.9 (L)     Assessment/Plan: No supplementation needed at this time.   Will replace for goal potassium ~ 4 and goal magnesium ~ 2.   Pharmacy will continue to monitor and adjust per consult.    Paticia Stack, PharmD Pharmacy Resident  03/11/2019 11:02 AM

## 2019-03-11 DEATH — deceased

## 2019-03-12 LAB — BASIC METABOLIC PANEL
Anion gap: 3 — ABNORMAL LOW (ref 5–15)
BUN: 25 mg/dL — ABNORMAL HIGH (ref 8–23)
CO2: 29 mmol/L (ref 22–32)
Calcium: 8.4 mg/dL — ABNORMAL LOW (ref 8.9–10.3)
Chloride: 111 mmol/L (ref 98–111)
Creatinine, Ser: 0.41 mg/dL — ABNORMAL LOW (ref 0.44–1.00)
GFR calc Af Amer: >60 mL/min (ref >60)
GFR calc non Af Amer: >60 mL/min (ref >60)
Glucose, Bld: 118 mg/dL — ABNORMAL HIGH (ref 70–99)
Potassium: 4.1 mmol/L (ref 3.5–5.1)
Sodium: 143 mmol/L (ref 135–145)

## 2019-03-12 LAB — GLUCOSE, CAPILLARY
Glucose-Capillary: 102 mg/dL — ABNORMAL HIGH (ref 70–99)
Glucose-Capillary: 105 mg/dL — ABNORMAL HIGH (ref 70–99)
Glucose-Capillary: 124 mg/dL — ABNORMAL HIGH (ref 70–99)
Glucose-Capillary: 90 mg/dL (ref 70–99)
Glucose-Capillary: 97 mg/dL (ref 70–99)
Glucose-Capillary: 99 mg/dL (ref 70–99)

## 2019-03-12 LAB — CBC WITH DIFFERENTIAL/PLATELET
Abs Immature Granulocytes: 1.06 10*3/uL — ABNORMAL HIGH (ref 0.00–0.07)
Basophils Absolute: 0.1 10*3/uL (ref 0.0–0.1)
Basophils Relative: 0 %
Eosinophils Absolute: 0 10*3/uL (ref 0.0–0.5)
Eosinophils Relative: 0 %
HCT: 29.4 % — ABNORMAL LOW (ref 36.0–46.0)
Hemoglobin: 8.1 g/dL — ABNORMAL LOW (ref 12.0–15.0)
Immature Granulocytes: 6 %
Lymphocytes Relative: 4 %
Lymphs Abs: 0.6 10*3/uL — ABNORMAL LOW (ref 0.7–4.0)
MCH: 24 pg — ABNORMAL LOW (ref 26.0–34.0)
MCHC: 27.6 g/dL — ABNORMAL LOW (ref 30.0–36.0)
MCV: 87 fL (ref 80.0–100.0)
Monocytes Absolute: 0.3 10*3/uL (ref 0.1–1.0)
Monocytes Relative: 2 %
Neutro Abs: 14.6 10*3/uL — ABNORMAL HIGH (ref 1.7–7.7)
Neutrophils Relative %: 88 %
Platelets: 536 10*3/uL — ABNORMAL HIGH (ref 150–400)
RBC: 3.38 MIL/uL — ABNORMAL LOW (ref 3.87–5.11)
RDW: 29.2 % — ABNORMAL HIGH (ref 11.5–15.5)
WBC: 16.6 10*3/uL — ABNORMAL HIGH (ref 4.0–10.5)
nRBC: 6.8 % — ABNORMAL HIGH (ref 0.0–0.2)

## 2019-03-12 LAB — MAGNESIUM: Magnesium: 1.9 mg/dL (ref 1.7–2.4)

## 2019-03-12 LAB — PHOSPHORUS: Phosphorus: 3.3 mg/dL (ref 2.5–4.6)

## 2019-03-12 MED ORDER — HYDRALAZINE HCL 20 MG/ML IJ SOLN
10.0000 mg | INTRAMUSCULAR | Status: DC | PRN
  Administered 2019-03-12 – 2019-03-13 (×2): 10 mg via INTRAVENOUS
  Filled 2019-03-12 (×2): qty 1

## 2019-03-12 MED ORDER — METOPROLOL TARTRATE 25 MG PO TABS
12.5000 mg | ORAL_TABLET | Freq: Two times a day (BID) | ORAL | Status: DC
  Administered 2019-03-12 – 2019-03-13 (×2): 12.5 mg via ORAL
  Filled 2019-03-12 (×2): qty 1

## 2019-03-12 MED ORDER — ENOXAPARIN SODIUM 120 MG/0.8ML ~~LOC~~ SOLN
100.0000 mg | Freq: Two times a day (BID) | SUBCUTANEOUS | Status: DC
  Administered 2019-03-12 – 2019-03-15 (×6): 100 mg via SUBCUTANEOUS
  Filled 2019-03-12 (×7): qty 0.8

## 2019-03-12 MED ORDER — FUROSEMIDE 10 MG/ML IJ SOLN
40.0000 mg | Freq: Every day | INTRAMUSCULAR | Status: DC
  Administered 2019-03-12 – 2019-03-15 (×4): 40 mg via INTRAVENOUS
  Filled 2019-03-12 (×4): qty 4

## 2019-03-12 MED ORDER — GLYCOPYRROLATE 0.2 MG/ML IJ SOLN
0.1000 mg | Freq: Once | INTRAMUSCULAR | Status: AC
  Administered 2019-03-12: 0.1 mg via INTRAVENOUS
  Filled 2019-03-12: qty 1

## 2019-03-12 MED ORDER — ACETAMINOPHEN 10 MG/ML IV SOLN
1000.0000 mg | Freq: Once | INTRAVENOUS | Status: AC
  Administered 2019-03-12: 1000 mg via INTRAVENOUS
  Filled 2019-03-12: qty 100

## 2019-03-12 NOTE — Plan of Care (Signed)

## 2019-03-12 NOTE — Progress Notes (Signed)
[facility] NAME: Yolanda Wells    MR#:  [id number]  DATE OF BIRTH:  1931/11/17    CHIEF COMPLAINT:   Chief Complaint  Patient presents with  . Altered Mental Status  . Shortness of Breath   Patient is on full ventilatory support.   Awake  REVIEW OF SYSTEMS:  Patient is intubated  DRUG ALLERGIES:  No Known Allergies  VITALS:  Blood pressure (!) 147/79, pulse 98, temperature 99.5 F (37.5 C), temperature source Axillary, resp. rate (!) 31, height 5' 2.01" (1.575 m), weight 100 kg, SpO2 99 %.  PHYSICAL EXAMINATION:   Physical Exam  GENERAL:  85 y.o.-year-old patient lying in the bed, obese.  Awake but confused EYES: Pupils equal, round, reactive to light and accommodation. No scleral icterus. Extraocular muscles intact.  HEENT: Head atraumatic, normocephalic. Oropharynx and nasopharynx clear.  NECK:  Supple, no jugular venous distention. No thyroid enlargement, no tenderness.  LUNGS: Decrease breath sounds bilatera CARDIOVASCULAR: S1, S2 normal. No murmurs, rubs, or gallops.  ABDOMEN: Soft, nontender, nondistended. Bowel sounds present. No organomegaly or mass.  EXTREMITIES: No cyanosis, clubbing. LE edema. PSYCHIATRIC: Confused  LABORATORY PANEL:   CBC Recent Labs  Lab 03/12/19 0628  WBC 16.6*  HGB 8.1*  HCT 29.4*  PLT 536*   ------------------------------------------------------------------------------------------------------------------ Chemistries  Recent Labs  Lab 03/12/19 0443  NA 143  K 4.1  CL 111  CO2 29  GLUCOSE 118*  BUN 25*  CREATININE 0.41*  CALCIUM 8.4*  MG 1.9   ------------------------------------------------------------------------------------------------------------------  Cardiac Enzymes No results for input(s): TROPONINI in the last 168 hours. ------------------------------------------------------------------------------------------------------------------  RADIOLOGY:  Dg  Chest Port 1 View  Result Date: 03/11/2019 CLINICAL DATA:  Respiratory failure.  Ventilator support. EXAM: PORTABLE CHEST 1 VIEW COMPARISON:  03/10/2019 FINDINGS: Endotracheal tube tip is 3 cm above the carina. Orogastric or nasogastric tube enters the stomach. Persistent bilateral lower lobe atelectasis and or pneumonia. The upper lungs remain clear. No significant change since yesterday. IMPRESSION: No change since yesterday. Endotracheal tube and orogastric tube well positioned. Persistent bilateral lower lobe atelectasis and or. Electronically Signed   By: Yolanda Wells M.D.   On: 03/11/2019 08:09   Dg Chest Port 1 View  Result Date: 03/10/2019 CLINICAL DATA:  Respiratory failure. EXAM: PORTABLE CHEST 1 VIEW COMPARISON:  03/09/2019 FINDINGS: Endotracheal tube remains with the tip approximately 3 cm above the carina. Stable cardiac enlargement. Gastric decompression tube extends into the stomach. Fairly stable bilateral lower lobe consolidation. There may remain small bilateral pleural effusions. No overt edema or evidence of pneumothorax. Enlarged main pulmonary artery contour. IMPRESSION: Stable bilateral lower lobe consolidation with probable small bilateral pleural effusions. Electronically Signed   By: Aletta Edouard M.D.   On: 03/10/2019 10:27     ASSESSMENT AND PLAN:   * Acute on chronic hypoxic hypercarbic respiratory failure failure due to COPD exacerbation,, pneumonia, treated with IV antibiotics vancomycin, cefepime., bronchodilators.Finish steroid course.   * Pulm HTN, COPD and now PNA Extubated 03/11/2019 Intensivist follow-up. Discussed with Yolanda Wells On 2 L O2  * RLL HCAP Treated with IV vancomycin and cefepime. Finished course  *Hypotension Improved  * Acute metabolic encephalopathy secondary to hypercarbia.  Has baseline dementia. On ventilator at this time Has NIV set up at home.  * Atrial  fibrillation with RVR, SVT On therapeutic dose of Lovenox Unable to  use scheduled rate control medications due to hypotension   *COPD exacerbation-improved Continue nebulization therapy Treated with  IV steroids and now stopped  *DVT prophylaxis Lovenox  * Dementia, agitation, continue Seroquel, Ativan. Palliative care consulted and following.   Waiting for family to come in today for further discussion regarding CODE STATUS/comfort care  All the records are reviewed and case discussed with Care Management/Social Worker Management plans discussed with the patient, family and they are in agreement.  CODE STATUS: FULL CODE  TOTAL TIME TAKING CARE OF THIS PATIENT: 35 minutes.   Yolanda Wells M.D on 03/12/2019 at 9:28 AM  Between 7am to 6pm - Pager - [phone]  After 6pm go to [email or web address] - password [email or web address] [facility]  Office  [phone]  CC: Primary care physician; Yolanda Boys, MD  Note: This dictation was prepared with Dragon dictation along with smaller phrase technology. Any transcriptional errors that result from this process are unintentional.

## 2019-03-12 NOTE — Progress Notes (Signed)
Updated patient's granddaughter at this time; answered all questions.

## 2019-03-12 NOTE — Progress Notes (Signed)
CRITICAL CARE NOTE     SUBJECTIVE FINDINGS & SIGNIFICANT EVENTS   Patient remains critically ill Prognosis is guarded Had episode of SVT today - HR 190s - responded to 2.5 metoprolol  -s/p Swallow evaluation - sips of teaspoon feeds starting.   PAST MEDICAL HISTORY   Past Medical History:  Diagnosis Date  . Acute deep vein thrombosis (DVT) of distal vein of left lower extremity ([place]) 10/03/2015  . Acute pulmonary embolism ([place]) 10/03/2015  . Anticoagulant long-term use   . Arthritis   . Arthritis   . CHF (congestive heart failure) ([place])   . Chronic anticoagulation 10/03/2015  . Collagen vascular disease ([place])   . Colostomy in place [facility]) 10/04/2015  . Deep venous thrombosis ([facility])    right lower extremity  . Dependent edema   . Diverticulosis of intestine with bleeding 04/02/2015  . Glenohumeral arthritis 06/28/2012  . Hammertoe 09/02/2012  . History of bilateral hip replacements 09/02/2012  . History of hysterectomy   . Hypertension   . Hypertension   . Hypokalemia   . Inability to ambulate due to hip 10/12/2015  . Mandibular dysfunction   . Onychomycosis 09/02/2012  . Osteoarthritis   . Polycythemia vera ([place])   . Presence of IVC filter 05/25/2015  . Stroke ([place]) 03/26/2015   cerebellar  . Urinary incontinence in female 09/02/2016     SURGICAL HISTORY   Past Surgical History:  Procedure Laterality Date  . ABDOMINAL HYSTERECTOMY    . BLEPHAROPLASTY Right 03/2017   right upper eyelid  . CARDIAC CATHETERIZATION Right 04/03/2015   Procedure: CENTRAL LINE INSERTION;  Surgeon: Sherri Rad, MD;  Location: [facility] ORS;  Service: General;  Laterality: Right;  . COLECTOMY WITH COLOSTOMY CREATION/HARTMANN PROCEDURE N/A 04/03/2015   Procedure: COLECTOMY WITH COLOSTOMY CREATION/HARTMANN PROCEDURE;   Surgeon: Sherri Rad, MD;  Location: [facility] ORS;  Service: General;  Laterality: N/A;  . COLON SURGERY    . FOOT AMPUTATION     partial  . FOOT AMPUTATION Right   . IVC FILTER INSERTION    . JOINT REPLACEMENT     Left and Right Hip  . PERIPHERAL VASCULAR CATHETERIZATION N/A 04/02/2015   Procedure: Visceral Angiography;  Surgeon: Algernon Huxley, MD;  Location: [facility];  Service: Cardiovascular;  Laterality: N/A;  . PERIPHERAL VASCULAR CATHETERIZATION N/A 04/02/2015   Procedure: Visceral Artery Intervention;  Surgeon: Algernon Huxley, MD;  Location: [place] [facility];  Service: Cardiovascular;  Laterality: N/A;  . PERIPHERAL  VASCULAR CATHETERIZATION N/A 05/25/2015   Procedure: IVC Filter Insertion;  Surgeon: Katha Cabal, MD;  Location: [place];  Service: Cardiovascular;  Laterality: N/A;  . TOE AMPUTATION     right  . TOTAL HIP ARTHROPLASTY       FAMILY HISTORY   Family History  Problem Relation Age of Onset  . Brain cancer Mother   . Heart attack Father   . COPD Father   . Coronary artery disease Father   . Hypertension Other   . Arthritis-Osteo Other   . Diabetes Brother   . Arthritis Brother   . Osteosarcoma Son   . Bone cancer Son   . Arthritis Sister   . Arthritis Brother   . Hypertension Brother   . Coronary artery disease Brother   . Malignant hyperthermia Brother      SOCIAL HISTORY   Social History   Tobacco Use  . Smoking status: Never Smoker  . Smokeless tobacco: Never Used  Substance Use Topics  . Alcohol use: No  . Drug use: No     MEDICATIONS   Current Medication:  Current Facility-Administered Medications:  .  0.9 %  sodium chloride infusion, , Intravenous, PRN, Hillary Bow, MD, Stopped at 03/06/19 0016 .  0.9 %  sodium chloride infusion, 250 mL, Intravenous, Continuous, Flora Lipps, MD, Stopped at 03/02/19 1445 .  acetaminophen (TYLENOL) tablet 650 mg, 650 mg, Oral, Q6H PRN, 650 mg at 03/01/19 1200 **OR**  acetaminophen (TYLENOL) suppository 650 mg, 650 mg, Rectal, Q6H PRN, Sudini, Srikar, MD .  aspirin chewable tablet 81 mg, 81 mg, Oral, Daily, Blakeney, Dreama Saa, NP .  chlorhexidine (PERIDEX) 0.12 % solution 15 mL, 15 mL, Mouth Rinse, BID, Lanney Gins, Shrey Boike, MD, 15 mL at 03/11/19 2223 .  enoxaparin (LOVENOX) injection 100 mg, 1 mg/kg, Subcutaneous, Q12H, Charlett Nose, RPH, 100 mg at 03/11/19 2221 .  insulin aspart (novoLOG) injection 0-9 Units, 0-9 Units, Subcutaneous, Q4H, Flora Lipps, MD, 1 Units at 03/11/19 1606 .  ipratropium-albuterol (DUONEB) 0.5-2.5 (3) MG/3ML nebulizer solution 3 mL, 3 mL, Nebulization, Q4H PRN, Wilhelmina Mcardle, MD .  ipratropium-albuterol (DUONEB) 0.5-2.5 (3) MG/3ML nebulizer solution 3 mL, 3 mL, Nebulization, Q6H, Kasa, Kurian, MD, 3 mL at 03/12/19 0205 .  lip balm (BLISTEX) ointment, , Topical, PRN, Darel Hong D, NP .  MEDLINE mouth rinse, 15 mL, Mouth Rinse, q12n4p, Caspian Deleonardis, MD, 15 mL at 03/11/19 1607 .  methylPREDNISolone sodium succinate (SOLU-MEDROL) 40 mg/mL injection 40 mg, 40 mg, Intravenous, Q12H, Blakeney, Dana G, NP, 40 mg at 03/12/19 0251 .  metoprolol tartrate (LOPRESSOR) injection 2.5-5 mg, 2.5-5 mg, Intravenous, Q6H PRN, Awilda Bill, NP, 2.5 mg at 03/11/19 1137 .  [DISCONTINUED] ondansetron (ZOFRAN) tablet 4 mg, 4 mg, Oral, Q6H PRN **OR** ondansetron (ZOFRAN) injection 4 mg, 4 mg, Intravenous, Q6H PRN, Sudini, Srikar, MD .  pantoprazole (PROTONIX) EC tablet 40 mg, 40 mg, Oral, Daily, Blakeney, Dana G, NP .  polyethylene glycol (MIRALAX / GLYCOLAX) packet 17 g, 17 g, Oral, Daily PRN, Sudini, Srikar, MD .  senna-docusate (Senokot-S) tablet 1 tablet, 1 tablet, Oral, BID, Flora Lipps, MD, 1 tablet at 03/11/19 1053 .  sodium chloride flush (NS) 0.9 % injection 10-40 mL, 10-40 mL, Intracatheter, Q12H, Darel Hong D, NP, 10 mL at 03/11/19 2222 .  sodium chloride flush (NS) 0.9 % injection 10-40 mL, 10-40 mL, Intracatheter, PRN, Darel Hong D, NP .  sodium chloride flush (NS) 0.9 % injection 3 mL, 3 mL,  Intravenous, Q12H, Hillary Bow, MD, 3 mL at 03/11/19 2221    ALLERGIES   Patient has no known allergies.    REVIEW OF SYSTEMS    Unable to obtain due to crtically ill state  PHYSICAL EXAMINATION   Vitals:   03/12/19 0500 03/12/19 0600  BP: (!) 144/67 (!) 147/76  Pulse: 98 95  Resp: (!) 22 (!) 29  Temp: 99.1 F (37.3 C)   SpO2: 98% 100%    GENERAL:NAD  HEAD: Normocephalic, atraumatic.  EYES: Pupils equal, round, reactive to light.  No scleral icterus.  MOUTH: Moist mucosal membrane. NECK: Supple. No thyromegaly. No nodules. No JVD.  PULMONARY: mild bibasilar crackles CARDIOVASCULAR: S1 and S2. Regular rate and rhythm. No murmurs, rubs, or gallops.  GASTROINTESTINAL: Soft, nontender, non-distended. No masses. Positive bowel sounds. No hepatosplenomegaly.  MUSCULOSKELETAL: mild LE edema + NEUROLOGIC: Mild distress due to acute illness SKIN:intact,warm,dry   LABS AND IMAGING     LAB RESULTS: Recent Labs  Lab 03/10/19 0500 03/11/19 0402 03/12/19 0443  NA 142 143 143  K 4.1 4.2 4.1  CL 108 108 111  CO2 28 29 29   BUN 31* 30* 25*  CREATININE 0.39* 0.48 0.41*  GLUCOSE 151* 180* 118*   Recent Labs  Lab 03/09/19 0438 03/10/19 0500 03/11/19 0402  HGB 8.6* 7.6* 7.5*  HCT 31.0* 27.1* 27.3*  WBC 13.7* 11.5* 11.4*  PLT 348 319 335     IMAGING RESULTS: Dg Chest Port 1 View  Result Date: 03/11/2019 CLINICAL DATA:  Respiratory failure.  Ventilator support. EXAM: PORTABLE CHEST 1 VIEW COMPARISON:  03/10/2019 FINDINGS: Endotracheal tube tip is 3 cm above the carina. Orogastric or nasogastric tube enters the stomach. Persistent bilateral lower lobe atelectasis and or pneumonia. The upper lungs remain clear. No significant change since yesterday. IMPRESSION: No change since yesterday. Endotracheal tube and orogastric tube well positioned. Persistent bilateral lower lobe atelectasis and or.  Electronically Signed   By: Nelson Chimes M.D.   On: 03/11/2019 08:09      ASSESSMENT AND PLAN     Acute Hypoxic Respiratory Failure -History of pulmonary venous thromboembolism and COPD -worsening peripheral edema and pulmonary edema - s/p lasix 20 trial yesteday produced adequate urine without transient hypotension will increse to lasix 40 daily today  -Status post treatment for community-acquired pneumonia-completed 7-day course -continue Full MV support -s/p discussion with family yesterday - code status Full until patient herself changes it as family states she can decide herself -restarting feeds as per swallow evaluation.      ID -continue IV abx as prescibed -follow up cultures  GI/Nutrition GI PROPHYLAXIS as indicated DIET-->TF's as tolerated Constipation protocol as indicated  ENDO - ICU hypoglycemic\Hyperglycemia protocol -check FSBS per protocol   ELECTROLYTES -follow labs as needed -replace as needed -pharmacy consultation   DVT/GI PRX ordered -SCDs  TRANSFUSIONS AS NEEDED MONITOR FSBS ASSESS the need for LABS as needed   Critical care provider statement:  Critical care time (minutes):31 Critical care time was exclusive of: Separately billable procedures and treating other patients Critical care was necessary to treat or prevent imminent or life-threatening deterioration of the following conditions:Acute hypoxemic respiratory failure,multiple comorbid conditions Critical care was time spent personally by me on the following activities: Development of treatment plan with patient or surrogate, discussions with consultants, evaluation of patient's response to treatment, examination of patient, obtaining history from patient or surrogate, ordering and performing treatments and interventions, ordering and review of laboratory studies and re-evaluation of patient's condition. I assumed  direction of critical care for this  patient from another provider in my specialty: no   This document was prepared using Dragon voice recognition software and may include unintentional dictation errors.    Ottie Glazier, M.D.  Division of [place]

## 2019-03-12 NOTE — Progress Notes (Signed)
   03/12/19 1200  Clinical Encounter Type  Visited With Patient and family together  Visit Type Follow-up;Spiritual support  Referral From Family  Consult/Referral To Chaplain  Stress Factors  Patient Stress Factors Health changes  Family Stress Factors Health changes  Chaplain received a page from front desk that patient's son Bunnie Pion Nyoka Cowden was here to see patient. Chaplain was told that son would be here to visit today. Chaplain brought son up to visit and nurse stated patient could not have visit because she was not actively dying. Chaplain told son he had five minutes to visit and son was ok. Chaplain escorted son back to medical mall and son thanked Clinical biochemist.

## 2019-03-12 NOTE — Progress Notes (Signed)
Pharmacy Electrolyte Monitoring Consult:  Pharmacy consulted to assist in monitoring and replacing electrolytes in this 85 y.o. female admitted on 03/08/2019 with Altered Mental Status and Shortness of Breath   Labs:  Sodium (mmol/L)  Date Value  03/12/2019 143  04/10/2015 137  07/08/2014 144   Potassium (mmol/L)  Date Value  03/12/2019 4.1  07/08/2014 3.7   Magnesium (mg/dL)  Date Value  03/12/2019 1.9  05/08/2014 2.6 (H)   Phosphorus (mg/dL)  Date Value  03/12/2019 3.3   Calcium (mg/dL)  Date Value  03/12/2019 8.4 (L)   Calcium, Total (mg/dL)  Date Value  07/08/2014 8.4 (L)   Albumin (g/dL)  Date Value  03/05/2019 3.1 (L)  07/08/2014 2.9 (L)     Assessment/Plan: No supplementation needed at this time.   Will replace for goal potassium ~ 4 and goal magnesium ~ 2.   Pharmacy will continue to monitor and adjust per consult.   Pernell Dupre, PharmD, BCPS Clinical Pharmacist 03/12/2019 5:15 AM

## 2019-03-13 LAB — BASIC METABOLIC PANEL
Anion gap: 8 (ref 5–15)
BUN: 22 mg/dL (ref 8–23)
CO2: 30 mmol/L (ref 22–32)
Calcium: 8.9 mg/dL (ref 8.9–10.3)
Chloride: 107 mmol/L (ref 98–111)
Creatinine, Ser: 0.5 mg/dL (ref 0.44–1.00)
GFR calc Af Amer: >60 mL/min (ref >60)
GFR calc non Af Amer: >60 mL/min (ref >60)
Glucose, Bld: 113 mg/dL — ABNORMAL HIGH (ref 70–99)
Potassium: 3.8 mmol/L (ref 3.5–5.1)
Sodium: 145 mmol/L (ref 135–145)

## 2019-03-13 LAB — MAGNESIUM: Magnesium: 2 mg/dL (ref 1.7–2.4)

## 2019-03-13 LAB — GLUCOSE, CAPILLARY
Glucose-Capillary: 102 mg/dL — ABNORMAL HIGH (ref 70–99)
Glucose-Capillary: 110 mg/dL — ABNORMAL HIGH (ref 70–99)
Glucose-Capillary: 115 mg/dL — ABNORMAL HIGH (ref 70–99)
Glucose-Capillary: 120 mg/dL — ABNORMAL HIGH (ref 70–99)
Glucose-Capillary: 131 mg/dL — ABNORMAL HIGH (ref 70–99)
Glucose-Capillary: 191 mg/dL — ABNORMAL HIGH (ref 70–99)
Glucose-Capillary: 97 mg/dL (ref 70–99)

## 2019-03-13 LAB — PHOSPHORUS: Phosphorus: 3.5 mg/dL (ref 2.5–4.6)

## 2019-03-13 MED ORDER — MAGNESIUM SULFATE 2 GM/50ML IV SOLN: 2.0000 g | Freq: Once | INTRAVENOUS | Status: DC

## 2019-03-13 MED ORDER — METOPROLOL TARTRATE 5 MG/5ML IV SOLN
2.5000 mg | INTRAVENOUS | Status: DC | PRN
  Administered 2019-03-13: 02:00:00 2.5 mg via INTRAVENOUS
  Administered 2019-03-13: 5 mg via INTRAVENOUS
  Filled 2019-03-13: qty 5

## 2019-03-13 MED ORDER — MAGNESIUM SULFATE IN D5W 1-5 GM/100ML-% IV SOLN
1.0000 g | Freq: Once | INTRAVENOUS | Status: AC
  Administered 2019-03-13: 1 g via INTRAVENOUS
  Filled 2019-03-13: qty 100

## 2019-03-13 MED ORDER — AMIODARONE HCL IN DEXTROSE 360-4.14 MG/200ML-% IV SOLN
30.0000 mg/h | INTRAVENOUS | Status: DC
  Administered 2019-03-13 – 2019-03-15 (×5): 30 mg/h via INTRAVENOUS
  Filled 2019-03-13 (×5): qty 200

## 2019-03-13 MED ORDER — FENTANYL CITRATE (PF) 100 MCG/2ML IJ SOLN
12.5000 ug | Freq: Four times a day (QID) | INTRAMUSCULAR | Status: DC | PRN
  Administered 2019-03-13: 12.5 ug via INTRAVENOUS
  Filled 2019-03-13: qty 2

## 2019-03-13 MED ORDER — FLUCONAZOLE 100MG IVPB
100.0000 mg | INTRAVENOUS | Status: DC
  Administered 2019-03-13: 100 mg via INTRAVENOUS
  Filled 2019-03-13 (×2): qty 50

## 2019-03-13 MED ORDER — AMIODARONE HCL IN DEXTROSE 360-4.14 MG/200ML-% IV SOLN
60.0000 mg/h | INTRAVENOUS | Status: AC
  Administered 2019-03-13: 60 mg/h via INTRAVENOUS
  Filled 2019-03-13 (×2): qty 200

## 2019-03-13 MED ORDER — POTASSIUM CHLORIDE CRYS ER 20 MEQ PO TBCR
20.0000 meq | EXTENDED_RELEASE_TABLET | Freq: Once | ORAL | Status: DC
  Filled 2019-03-13: qty 1

## 2019-03-13 MED ORDER — METOPROLOL TARTRATE 25 MG PO TABS
25.0000 mg | ORAL_TABLET | Freq: Two times a day (BID) | ORAL | Status: DC
  Administered 2019-03-13 (×2): 25 mg via ORAL
  Filled 2019-03-13 (×2): qty 1

## 2019-03-13 MED ORDER — IPRATROPIUM BROMIDE 0.02 % IN SOLN: 0.2500 mg | Freq: Four times a day (QID) | RESPIRATORY_TRACT | Status: DC | PRN

## 2019-03-13 MED ORDER — AMIODARONE LOAD VIA INFUSION
150.0000 mg | Freq: Once | INTRAVENOUS | Status: AC
  Administered 2019-03-13: 150 mg via INTRAVENOUS
  Filled 2019-03-13: qty 83.34

## 2019-03-13 MED ORDER — LEVALBUTEROL HCL 0.63 MG/3ML IN NEBU: 0.6300 mg | INHALATION_SOLUTION | Freq: Four times a day (QID) | RESPIRATORY_TRACT | Status: DC | PRN

## 2019-03-13 NOTE — Evaluation (Signed)
Clinical/Bedside Swallow Evaluation Patient Details  Name: Yolanda Wells MRN: [id number] Date of Birth: [birth year]  Today's Date: 03/12/2019 Time: SLP Start Time (ACUTE ONLY): SLP Stop Time (ACUTE ONLY): 60 mins SLP Time Calculation (min) (ACUTE ONLY): 60 min  Past Medical History:  Past Medical History:  Diagnosis Date  . Acute deep vein thrombosis (DVT) of distal vein of left lower extremity ([place]) 10/03/2015  . Acute pulmonary embolism ([place]) 10/03/2015  . Anticoagulant long-term use   . Arthritis   . Arthritis   . CHF (congestive heart failure) ([place])   . Chronic anticoagulation 10/03/2015  . Collagen vascular disease ([place])   . Colostomy in place [facility]) 10/04/2015  . Deep venous thrombosis (HCC)    right lower extremity  . Dependent edema   . Diverticulosis of intestine with bleeding 04/02/2015  . Glenohumeral arthritis 06/28/2012  . Hammertoe 09/02/2012  . History of bilateral hip replacements 09/02/2012  . History of hysterectomy   . Hypertension   . Hypertension   . Hypokalemia   . Inability to ambulate due to hip 10/12/2015  . Mandibular dysfunction   . Onychomycosis 09/02/2012  . Osteoarthritis   . Polycythemia vera ([place])   . Presence of IVC filter 05/25/2015  . 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 Urinary incontinence in female 09/02/2016   Past Surgical History:  Past Surgical History:  Procedure Laterality Date  . ABDOMINAL HYSTERECTOMY    . BLEPHAROPLASTY Right 03/2017   right upper eyelid  . CARDIAC CATHETERIZATION Right 04/03/2015   Procedure: CENTRAL LINE INSERTION;  Surgeon: Sherri Rad, MD;  Location: [facility] ORS;  Service: General;  Laterality: Right;  . COLECTOMY WITH COLOSTOMY CREATION/HARTMANN PROCEDURE N/A 04/03/2015   Procedure: COLECTOMY WITH COLOSTOMY CREATION/HARTMANN PROCEDURE;  Surgeon: Sherri Rad, MD;  Location: [facility] ORS;  Service: General;  Laterality: N/A;  . COLON SURGERY    . FOOT AMPUTATION     partial  . FOOT AMPUTATION Right   . IVC  FILTER INSERTION    . JOINT REPLACEMENT     Left and Right Hip  . PERIPHERAL VASCULAR CATHETERIZATION N/A 04/02/2015   Procedure: Visceral Angiography;  Surgeon: Algernon Huxley, MD;  Location: [place] [facility];  Service: Cardiovascular;  Laterality: N/A;  . PERIPHERAL VASCULAR CATHETERIZATION N/A 04/02/2015   Procedure: Visceral Artery Intervention;  Surgeon: Algernon Huxley, MD;  Location: [place] [facility];  Service: Cardiovascular;  Laterality: N/A;  . PERIPHERAL VASCULAR CATHETERIZATION N/A 05/25/2015   Procedure: IVC Filter Insertion;  Surgeon: Katha Cabal, MD;  Location: [place] [facility];  Service: Cardiovascular;  Laterality: N/A;  . TOE AMPUTATION     right  . TOTAL HIP ARTHROPLASTY     HPI:  Pt is a 85 y.o. female with a known history of polycythemia vera for which patient follows up with [facility], pulmonary embolism and DVT on chronic anticoagulation with Eliquis, chronic hypoxic respiratory failure on 2 L of oxygen at baseline ,chronic diastolic CHF and hypertension was brought into the emergency room today with complaints of shortness of breath and some wheezing.  Occasional nonproductive cough.  No fevers.  No sick contacts.  No recent travel history.  No nausea vomiting or diarrhea.  No abdominal pains.  Initial CXR revealed pulmonary vascular congestion; COPD exacerbation w/ just prior admission per notes.  A CXR on 02/27/2019 revealed Right lower lobe atelectasis or pneumonia with small right effusion.  Pt was then orally intubated on 03/01/2019 d/t decline in Pulmonary status; extubated on 03/11/2019.  She has been NPO since the intubation.    Assessment / Plan / Recommendation Clinical Impression  Pt appears to present w/ oropharyngeal phase dysphagia w/ increased risk for aspiration/choking d/t current presetation of declined Cognitive status and overall awareness. Such decreased oral awareness of po tasks can increase risk for dysphagia, aspiration. Pt was positioned upright in  bed then presented TSP trials of single ice chips, Nectar consistency liquids, and purees. Pt required full verbal cues and visual/tactile prompt to attend to task: "ready for the next bite?". Pt demonstrated intermittently slower response of opening mouth for acceptance, however, once bolus was placed in mouth, she appeared to exhibit adequate labial/lingual movements and attention to bolus for management. Min prolonged oral phase time noted w/ the purees(increased texture). Pt manipulated each bolus then a pharyngeal swallow was appreciated w/ fair laryngeal excursion noted w/ the swallow. Oral clearing was achieved w/ each trial though pt took her time for lingual sweeping(cognitive in nature?). No overt s/s of aspiration noted; no decline in RR(low 20s) or O2 sats(100%); HR remained 99-101. Due to pt's recent lengthy oral intubation(11 days), declined Cognitive awareness for po tasks, and increased risk for aspiration, recommend remain NPO for meals but w/ NSG Supervision, pt may have Pleasure TSP bites/sips of Nectar liquids and puree(applesauce) - strict monitoring for any overt s/s of aspiration at all times. ST services will f/u next 1-2 days to determine pt's readiness and safety w/ increasing to an oral diet. NSG updated.  SLP Visit Diagnosis: Dysphagia, oropharyngeal phase (R13.12)    Aspiration Risk  Moderate aspiration risk;Risk for inadequate nutrition/hydration    Diet Recommendation     Medication Administration: Via alternative means Compensations: Minimize environmental distractions;Slow rate;Small sips/bites;Lingual sweep for clearance of pocketing;Multiple dry swallows after each bite/sip;Follow solids with liquid    Other  Recommendations Recommended Consults: (Dietician f/u) Oral Care Recommendations: Oral care BID;Staff/trained caregiver to provide oral care Other Recommendations: Order thickener from pharmacy;Prohibited food (jello, ice cream, thin soups);Remove water  pitcher;Have oral suction available   Follow up Recommendations Skilled Nursing facility(TBD)      Frequency and Duration min 3x week  2 weeks       Prognosis Prognosis for Safe Diet Advancement: Fair Barriers to Reach Goals: Cognitive deficits;Severity of deficits;Time post onset      Swallow Study   General Date of Onset: 02/20/2019 HPI: Pt is a 85 y.o. female with a known history of polycythemia vera for which patient follows up with [facility], pulmonary embolism and DVT on chronic anticoagulation with Eliquis, chronic hypoxic respiratory failure on 2 L of oxygen at baseline ,chronic diastolic CHF and hypertension was brought into the emergency room today with complaints of shortness of breath and some wheezing.  Occasional nonproductive cough.  No fevers.  No sick contacts.  No recent travel history.  No nausea vomiting or diarrhea.  No abdominal pains.  Initial CXR revealed pulmonary vascular congestion; COPD exacerbation w/ just prior admission per notes.  A CXR on 02/27/2019 revealed Right lower lobe atelectasis or pneumonia with small right effusion.  Pt was then orally intubated on 03/01/2019 d/t decline in Pulmonary status; extubated on 03/11/2019.  She has been NPO since the intubation.  Previous Swallow Assessment: none reported; pt was seen for speech-language assessment(dysarthria) in 08/2018 Temperature Spikes Noted: No(wbc elevated yesterday) Respiratory Status: Nasal cannula(2 liters) History of Recent Intubation: Yes Length of Intubations (days): 11 days Date extubated: 03/11/19 Behavior/Cognition: Cooperative;Pleasant mood;Confused;Distractible;Requires cueing(Awake; nonverbal) Oral Care Completed by SLP: Yes Oral  [place] - Dentition: (few lower front dentition only) Vision: (n/a) Baseline Vocal Quality: (nonverbal) Volitional Cough: Cognitively unable to elicit Volitional Swallow: Unable to elicit    Oral/Motor/Sensory Function Overall Oral Motor/Sensory Function: Within  functional limits(movements appeared [facility] during oral care and w/ boluses)   Ice Chips Ice chips: Impaired Presentation: Spoon(fed; 2 trials) Oral Phase Impairments: (none) Oral Phase Functional Implications: Prolonged oral transit Pharyngeal Phase Impairments: (none) Other Comments: verbal cues to ready/accept each trial   Thin Liquid Thin Liquid: Not tested    Nectar Thick Nectar Thick Liquid: Impaired Presentation: Spoon(fed; 9 trials) Oral Phase Impairments: (none) Oral phase functional implications: Prolonged oral transit(slow acceptance of trials) Pharyngeal Phase Impairments: (none)   Honey Thick Honey Thick Liquid: Not tested   Puree Puree: Impaired Presentation: Spoon(fed; 6 1/2 tsp trials) Oral Phase Impairments: (none) Oral Phase Functional Implications: Prolonged oral transit(slow acceptance of trials) Pharyngeal Phase Impairments: (none)   Solid     Solid: Not tested      Orinda Kenner, MS, CCC-SLP , 03/12/2019, 1300 PM

## 2019-03-13 NOTE — Progress Notes (Signed)
Assisted tele visit to patient with family member.  Renley Gutman M, RN  

## 2019-03-13 NOTE — Progress Notes (Signed)
Spoke with Dr A regarding patients SVT 190's. and blood pressure. MD will call cardiology Dr Ubaldo Glassing. At this time no new orders given.

## 2019-03-13 NOTE — Progress Notes (Signed)
Pharmacy Electrolyte Monitoring Consult:  Pharmacy consulted to assist in monitoring and replacing electrolytes in this 85 y.o. female admitted on 02/17/2019 with Altered Mental Status and Shortness of Breath   Labs:  Sodium (mmol/L)  Date Value  03/13/2019 145  04/10/2015 137  07/08/2014 144   Potassium (mmol/L)  Date Value  03/13/2019 3.8  07/08/2014 3.7   Magnesium (mg/dL)  Date Value  03/13/2019 2.0  05/08/2014 2.6 (H)   Phosphorus (mg/dL)  Date Value  03/13/2019 3.5   Calcium (mg/dL)  Date Value  03/13/2019 8.9   Calcium, Total (mg/dL)  Date Value  07/08/2014 8.4 (L)   Albumin (g/dL)  Date Value  02/14/2019 3.1 (L)  07/08/2014 2.9 (L)     Assessment/Plan: 5/3 AM:  K: 3.8   Patient is receiving furosemide 40mg  IV daily.    Will order KCL 13mEq x 1 dose.   Will replace for goal potassium ~ 4 and goal magnesium ~ 2.   Pharmacy will continue to monitor and adjust per consult.   Pernell Dupre, PharmD, BCPS Clinical Pharmacist 03/13/2019 6:17 AM

## 2019-03-13 NOTE — Progress Notes (Signed)
CRITICAL CARE NOTE       SUBJECTIVE FINDINGS & SIGNIFICANT EVENTS   Chronically comorbid patient with advanced age, history of COPD, breast and bladder CA, DVT and PE on chronic Eliquis with acute hypoxemic respiratory failure status post CAP tx and successful weaning off mechanical ventilation.  Multiple discussions with family regarding goals of care,  currently full code.  Complicated by periods of SVT- discussed with cardiology today status post amnio drip.  PAST MEDICAL HISTORY   Past Medical History:  Diagnosis Date  . Acute deep vein thrombosis (DVT) of distal vein of left lower extremity ([place]) 10/03/2015  . Acute pulmonary embolism ([place]) 10/03/2015  . Anticoagulant long-term use   . Arthritis   . Arthritis   . CHF (congestive heart failure) ([place])   . Chronic anticoagulation 10/03/2015  . Collagen vascular disease ([place])   . Colostomy in place [facility]) 10/04/2015  . Deep venous thrombosis (HCC)    right lower extremity  . Dependent edema   . Diverticulosis of intestine with bleeding 04/02/2015  . Glenohumeral arthritis 06/28/2012  . Hammertoe 09/02/2012  . History of bilateral hip replacements 09/02/2012  . History of hysterectomy   . Hypertension   . Hypertension   . Hypokalemia   . Inability to ambulate due to hip 10/12/2015  . Mandibular dysfunction   . Onychomycosis 09/02/2012  . Osteoarthritis   . Polycythemia vera ([place])   . Presence of IVC filter 05/25/2015  . 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 Urinary incontinence in female 09/02/2016     SURGICAL HISTORY   Past Surgical History:  Procedure Laterality Date  . ABDOMINAL HYSTERECTOMY    . BLEPHAROPLASTY Right 03/2017   right upper eyelid  . CARDIAC CATHETERIZATION Right 04/03/2015   Procedure: CENTRAL LINE INSERTION;  Surgeon:  Sherri Rad, MD;  Location: [facility] ORS;  Service: General;  Laterality: Right;  . COLECTOMY WITH COLOSTOMY CREATION/HARTMANN PROCEDURE N/A 04/03/2015   Procedure: COLECTOMY WITH COLOSTOMY CREATION/HARTMANN PROCEDURE;  Surgeon: Sherri Rad, MD;  Location: [facility] ORS;  Service: General;  Laterality: N/A;  . COLON SURGERY    . FOOT AMPUTATION     partial  . FOOT AMPUTATION Right   . IVC FILTER INSERTION    . JOINT REPLACEMENT     Left and Right Hip  . PERIPHERAL VASCULAR CATHETERIZATION N/A 04/02/2015   Procedure: Visceral Angiography;  Surgeon: Algernon Huxley, MD;  Location: [place] [facility];  Service: Cardiovascular;  Laterality: N/A;  . PERIPHERAL VASCULAR CATHETERIZATION N/A 04/02/2015   Procedure: Visceral Artery Intervention;  Surgeon: Algernon Huxley, MD;  Location: [place] [facility];  Service: Cardiovascular;  Laterality: N/A;  . PERIPHERAL VASCULAR CATHETERIZATION N/A 05/25/2015   Procedure: IVC Filter Insertion;  Surgeon: Katha Cabal, MD;  Location: [place] [facility];  Service: Cardiovascular;  Laterality: N/A;  . TOE AMPUTATION     right  . TOTAL HIP ARTHROPLASTY       FAMILY HISTORY   Family History  Problem Relation Age of Onset  . Brain cancer Mother   . Heart attack Father   . COPD Father   . Coronary artery disease Father   . Hypertension Other   . Arthritis-Osteo Other   . Diabetes Brother   . Arthritis Brother   . Osteosarcoma Son   . Bone cancer Son   . Arthritis Sister   . Arthritis Brother   . Hypertension Brother   . Coronary artery disease Brother   . Malignant hyperthermia  Brother      SOCIAL HISTORY   Social History   Tobacco Use  . Smoking status: Never Smoker  . Smokeless tobacco: Never Used  Substance Use Topics  . Alcohol use: No  . Drug use: No     MEDICATIONS   Current Medication:  Current Facility-Administered Medications:  .  0.9 %  sodium chloride infusion, , Intravenous, PRN, Hillary Bow, MD, Stopped at 03/06/19 0016 .   0.9 %  sodium chloride infusion, 250 mL, Intravenous, Continuous, Flora Lipps, MD, Stopped at 03/02/19 1445 .  acetaminophen (TYLENOL) tablet 650 mg, 650 mg, Oral, Q6H PRN, 650 mg at 03/01/19 1200 **OR** acetaminophen (TYLENOL) suppository 650 mg, 650 mg, Rectal, Q6H PRN, Sudini, Srikar, MD .  Margrett Rud amiodarone (NEXTERONE) 1.8 mg/mL load via infusion 150 mg, 150 mg, Intravenous, Once, 150 mg at 03/13/19 1057 **FOLLOWED BY** amiodarone (NEXTERONE PREMIX) 360-4.14 MG/200ML-% (1.8 mg/mL) IV infusion, 60 mg/hr, Intravenous, Continuous, Last Rate: 33.3 mL/hr at 03/13/19 1122, 60 mg/hr at 03/13/19 1122 **FOLLOWED BY** amiodarone (NEXTERONE PREMIX) 360-4.14 MG/200ML-% (1.8 mg/mL) IV infusion, 30 mg/hr, Intravenous, Continuous, Fath, Kenneth A, MD .  aspirin chewable tablet 81 mg, 81 mg, Oral, Daily, Awilda Bill, NP, 81 mg at 03/13/19 0935 .  chlorhexidine (PERIDEX) 0.12 % solution 15 mL, 15 mL, Mouth Rinse, BID, Lanney Gins, Trampus Mcquerry, MD, 15 mL at 03/13/19 0935 .  enoxaparin (LOVENOX) injection 100 mg, 100 mg, Subcutaneous, Q12H, Sudini, Srikar, MD, 100 mg at 03/13/19 0939 .  fentaNYL (SUBLIMAZE) injection 12.5 mcg, 12.5 mcg, Intravenous, Q6H PRN, Tukov-Yual, Magdalene S, NP, 12.5 mcg at 03/13/19 0157 .  fluconazole (DIFLUCAN) IVPB 100 mg, 100 mg, Intravenous, Q24H, Laconya Clere, MD .  furosemide (LASIX) injection 40 mg, 40 mg, Intravenous, Daily, Lanney Gins, Darlean Warmoth, MD, 40 mg at 03/13/19 0935 .  hydrALAZINE (APRESOLINE) injection 10 mg, 10 mg, Intravenous, Q4H PRN, Tukov-Yual, Magdalene S, NP, 10 mg at 03/13/19 0807 .  insulin aspart (novoLOG) injection 0-9 Units, 0-9 Units, Subcutaneous, Q4H, Flora Lipps, MD, 1 Units at 03/12/19 2017 .  ipratropium-albuterol (DUONEB) 0.5-2.5 (3) MG/3ML nebulizer solution 3 mL, 3 mL, Nebulization, Q4H PRN, Wilhelmina Mcardle, MD .  ipratropium-albuterol (DUONEB) 0.5-2.5 (3) MG/3ML nebulizer solution 3 mL, 3 mL, Nebulization, Q6H, Kasa, Kurian, MD, 3 mL at 03/13/19 0812 .   lip balm (BLISTEX) ointment, , Topical, PRN, Darel Hong D, NP .  MEDLINE mouth rinse, 15 mL, Mouth Rinse, q12n4p, Anderson Middlebrooks, MD, 15 mL at 03/12/19 1643 .  methylPREDNISolone sodium succinate (SOLU-MEDROL) 40 mg/mL injection 40 mg, 40 mg, Intravenous, Q12H, Blakeney, Dreama Saa, NP, 40 mg at 03/13/19 0346 .  metoprolol tartrate (LOPRESSOR) injection 2.5-5 mg, 2.5-5 mg, Intravenous, Q4H PRN, Tukov-Yual, Magdalene S, NP, 5 mg at 03/13/19 0825 .  metoprolol tartrate (LOPRESSOR) tablet 25 mg, 25 mg, Oral, BID, Juanjose Mojica, MD, 25 mg at 03/13/19 0933 .  [DISCONTINUED] ondansetron (ZOFRAN) tablet 4 mg, 4 mg, Oral, Q6H PRN **OR** ondansetron (ZOFRAN) injection 4 mg, 4 mg, Intravenous, Q6H PRN, Sudini, Srikar, MD .  pantoprazole (PROTONIX) EC tablet 40 mg, 40 mg, Oral, Daily, Awilda Bill, NP, 40 mg at 03/13/19 0935 .  polyethylene glycol (MIRALAX / GLYCOLAX) packet 17 g, 17 g, Oral, Daily PRN, Sudini, Srikar, MD .  potassium chloride SA (K-DUR) CR tablet 20 mEq, 20 mEq, Oral, Once, Hallaji, Sheema M, RPH .  senna-docusate (Senokot-S) tablet 1 tablet, 1 tablet, Oral, BID, Flora Lipps, MD, 1 tablet at 03/13/19 0939 .  sodium chloride flush (NS) 0.9 %  injection 10-40 mL, 10-40 mL, Intracatheter, Q12H, Darel Hong D, NP, 10 mL at 03/13/19 0938 .  sodium chloride flush (NS) 0.9 % injection 10-40 mL, 10-40 mL, Intracatheter, PRN, Darel Hong D, NP .  sodium chloride flush (NS) 0.9 % injection 3 mL, 3 mL, Intravenous, Q12H, Sudini, Srikar, MD, 3 mL at 03/13/19 5643    ALLERGIES   Patient has no known allergies.    REVIEW OF SYSTEMS    Unable to obtain ROS due to confusion with dementia  PHYSICAL EXAMINATION   Vitals:   03/13/19 0900 03/13/19 1000  BP: 131/82 (!) 97/57  Pulse: 95 95  Resp: (!) 30 (!) 38  Temp:    SpO2: 98% 97%    GENERAL: No apparent distress HEAD: Normocephalic, atraumatic.  EYES: Pupils equal, round, reactive to light.  No scleral icterus.  MOUTH:  Moist mucosal membrane. NECK: Supple. No thyromegaly. No nodules. No JVD.  PULMONARY: Crackles at the bases bilaterally CARDIOVASCULAR: S1 and S2. Regular rate and rhythm. No murmurs, rubs, or gallops.  GASTROINTESTINAL: Soft, nontender, non-distended. No masses. Positive bowel sounds. No hepatosplenomegaly.  MUSCULOSKELETAL: No swelling, clubbing, or edema.  NEUROLOGIC: Mild distress due to acute illness SKIN:intact,warm,dry   LABS AND IMAGING     LAB RESULTS: Recent Labs  Lab 03/11/19 0402 03/12/19 0443 03/13/19 0542  NA 143 143 145  K 4.2 4.1 3.8  CL 108 111 107  CO2 29 29 30   BUN 30* 25* 22  CREATININE 0.48 0.41* 0.50  GLUCOSE 180* 118* 113*   Recent Labs  Lab 03/10/19 0500 03/11/19 0402 03/12/19 0628  HGB 7.6* 7.5* 8.1*  HCT 27.1* 27.3* 29.4*  WBC 11.5* 11.4* 16.6*  PLT 319 335 536*     IMAGING RESULTS: No results found.    ASSESSMENT AND PLAN    Acute Hypoxic Respiratory Failure -History of pulmonary venous thromboembolism and COPD -worsening peripheral edema and pulmonary edema - s/p lasix 20 trial yesteday produced adequate urine without transient hypotension will increse to lasix 40 daily today  -Status post treatment for community-acquired pneumonia-completed 7-day course -s/p discussion with family yesterday - code status Full until patient herself changes it as family states she can decide herself -restarting feeds as per swallow evaluation.    Intermittent periods of SVT         -Refractory to twice daily Lopressor         -Discussed with cardiology-Dr. Fath-appreciate recommendations         -Amiodarone loading dose plus GTT   ID -continue IV abx as prescibed -follow up cultures  GI/Nutrition GI PROPHYLAXIS as indicated DIET-->TF's as tolerated Constipation protocol as indicated  ENDO - ICU hypoglycemic\Hyperglycemia protocol -check FSBS per protocol   ELECTROLYTES -follow labs as needed -replace as needed  -pharmacy consultation   DVT/GI PRX ordered -SCDs  TRANSFUSIONS AS NEEDED MONITOR FSBS ASSESS the need for LABS as needed   Critical care provider statement:  Critical care time (minutes):31 Critical care time was exclusive of: Separately billable procedures and treating other patients Critical care was necessary to treat or prevent imminent or life-threatening deterioration of the following conditions:Acute hypoxemic respiratory failure,multiple comorbid conditions Critical care was time spent personally by me on the following activities: Development of treatment plan with patient or surrogate, discussions with consultants, evaluation of patient's response to treatment, examination of patient, obtaining history from patient or surrogate, ordering and performing treatments and interventions, ordering and review of laboratory studies and re-evaluation of patient's condition. I assumed direction of  critical care for this patient from another provider in my specialty: no   This document was prepared using Dragon voice recognition software and may include unintentional dictation errors.   Ottie Glazier, M.D.  Division of [place]

## 2019-03-13 NOTE — Progress Notes (Addendum)
Speech Language Pathology Treatment: Dysphagia  Patient Details Name: Nigeria Lasseter MRN: [id number] DOB: [birth year] Today's Date: 03/13/2019 Time: 2751-7001 SLP Time Calculation (min) (ACUTE ONLY): 45 min  Assessment / Plan / Recommendation Clinical Impression  Pt seen for ongoing assessment of swallowing; toleration of initiation of trial of Pleasure po's of puree(applesauce) and nectar liquids w/ NSG Supervision. NSG reported pt has tolerated bites of applesauce w/ her, but that during the night, the NSG report indicated that pt "forgot how to swallow" and held the puree trials so further po's were not given (as instructed in orders). Pt is currently awake in bed and responding to SLP's voice looking to SLP and opening eyes. Noted pt did not maintain eye opening for as long a period at a time as she did during the eval yesterday. Pt mouthed as she attempted to communicate to SLP AND also attained intermittent (min) phonation to her words though most were not intelligible. Also noted pt's RR to fluctuate b/t teens-low 40s - NSG reported same as well as fluctuation in HR this morning. Oral care completed w/ min coating noted on tongue - this was addressed w/ MD for IV Diflucan d/t NPO status.  Pt continues to appear to present w/ oropharyngeal phase dysphagia w/ increased risk for aspiration/choking in light of current decline in Medical and Cognitive status'. Any decreased oral awareness of po tasks can increase risk for dysphagia, aspiration. Pt was positioned upright in bed and presented TSP trials of Nectar consistency liquids and purees - some purees included Crushed medications w/ NSG present for presentation. Pt required full verbal cues and visual/tactile prompt to attend to task: "ready for the next bite?". Pt demonstrated intermittently slower response of opening mouth for acceptance, however, once bolus was placed in mouth, she appeared to exhibit adequate labial/lingual movements and attention  to bolus for full management followed by oral clearing. Min prolonged oral phase time noted overall. Pt manipulated each bolus then a pharyngeal swallow was appreciated w/ fair laryngeal excursion noted w/ the swallow. No overt s/s of aspiration noted; no decline in RR or O2 sats(97%). Though d/t to pt's new fluctuations in RR and closing of eyes, recent lengthy oral intubation(11 days), declined Cognitive awareness for po tasks, and increased risk for aspiration, recommend continue NPO status for meals but w/ NSG Supervision, pt may have Pleasure TSP bites/sips of Honey consistency liquids and puree(applesauce) - strict monitoring for any overt s/s of aspiration at all times. ST services will continue f/u and monitoring to determine pt's readiness and safety w/ increasing to an oral diet. MD/NSG updated, agreed.      HPI HPI: Pt is a 85 y.o. female with a known history of polycythemia vera for which patient follows up with [facility], pulmonary embolism and DVT on chronic anticoagulation with Eliquis, chronic hypoxic respiratory failure on 2 L of oxygen at baseline ,chronic diastolic CHF and hypertension was brought into the emergency room today with complaints of shortness of breath and some wheezing.  Occasional nonproductive cough.  No fevers.  No sick contacts.  No recent travel history.  No nausea vomiting or diarrhea.  No abdominal pains.  Initial CXR revealed pulmonary vascular congestion; COPD exacerbation w/ just prior admission per notes.  A CXR on 02/27/2019 revealed Right lower lobe atelectasis or pneumonia with small right effusion.  Pt was then orally intubated on 03/01/2019 d/t decline in Pulmonary status; extubated on 03/11/2019.  She has been NPO since the intubation.  SLP Plan  Continue with current plan of care       Recommendations  Diet recommendations: Dysphagia 1 (puree);Honey-thick liquid(applesauce today) Liquids provided via: Teaspoon Medication Administration: Crushed with  puree(if able vs alternative means when [place].) Supervision: Full supervision/cueing for compensatory strategies;Trained caregiver to feed patient Compensations: Minimize environmental distractions;Slow rate;Small sips/bites;Lingual sweep for clearance of pocketing;Multiple dry swallows after each bite/sip;Follow solids with liquid Postural Changes and/or Swallow Maneuvers: Seated upright 90 degrees;Upright 30-60 min after meal                General recommendations: (Dietician f/u) Oral Care Recommendations: Oral care BID;Staff/trained caregiver to provide oral care Follow up Recommendations: Skilled Nursing facility SLP Visit Diagnosis: Dysphagia, oropharyngeal phase (R13.12) Plan: Continue with current plan of care       [place], [place], [place] 03/13/2019, 11:14 AM

## 2019-03-13 NOTE — Consult Note (Signed)
Cardiology Consultation Note    Patient ID: Yolanda Wells, MRN: [id number], DOB/AGE: 83-Oct-1933 83 y.o. Admit date: 02/25/2019   Date of Consult: 03/13/2019 Primary Physician: [name], MD Primary Cardiologist: Dr. Clayborn Bigness  Chief Complaint:   Reason for Consultation: afib with rvr Requesting MD: Dr. Lanney Gins  HPI: Yolanda Wells is a 85 y.o. female with history of dementia, COPD, pulmonary embolus, congestive heart failure admitted with acute hypoxemic respiratory failure status post community-acquired pneumonia therapy.  Was intubated.  After a prolonged intubation was able to be extubated.  She is on chronic Eliquis for pulmonary embolus.  Post extubation family wish more aggressive care as she had been comfort care initially.  She has episodic what appears to be atrial fibrillation rapid ventricular response with intermittent sinus rhythm.  She is not able to give a history.  She appears hemodynamically stable but is relatively hypotensive.  Past Medical History:  Diagnosis Date  . Acute deep vein thrombosis (DVT) of distal vein of left lower extremity ([address]) 10/03/2015  . Acute pulmonary embolism ([place]) 10/03/2015  . Anticoagulant long-term use   . Arthritis   . Arthritis   . CHF (congestive heart failure) ([place])   . Chronic anticoagulation 10/03/2015  . Collagen vascular disease ([place])   . Colostomy in place [facility]) 10/04/2015  . Deep venous thrombosis ([facility])    right lower extremity  . Dependent edema   . Diverticulosis of intestine with bleeding 04/02/2015  . Glenohumeral arthritis 06/28/2012  . Hammertoe 09/02/2012  . History of bilateral hip replacements 09/02/2012  . History of hysterectomy   . Hypertension   . Hypertension   . Hypokalemia   . Inability to ambulate due to hip 10/12/2015  . Mandibular dysfunction   . Onychomycosis 09/02/2012  . Osteoarthritis   . Polycythemia vera ([place])   . Presence of IVC filter 05/25/2015  . Stroke ([place]) 03/26/2015   cerebellar   . Urinary incontinence in female 09/02/2016      Surgical History:  Past Surgical History:  Procedure Laterality Date  . ABDOMINAL HYSTERECTOMY    . BLEPHAROPLASTY Right 03/2017   right upper eyelid  . CARDIAC CATHETERIZATION Right 04/03/2015   Procedure: CENTRAL LINE INSERTION;  Surgeon: Sherri Rad, MD;  Location: [facility] ORS;  Service: General;  Laterality: Right;  . COLECTOMY WITH COLOSTOMY CREATION/HARTMANN PROCEDURE N/A 04/03/2015   Procedure: COLECTOMY WITH COLOSTOMY CREATION/HARTMANN PROCEDURE;  Surgeon: Sherri Rad, MD;  Location: [facility] ORS;  Service: General;  Laterality: N/A;  . COLON SURGERY    . FOOT AMPUTATION     partial  . FOOT AMPUTATION Right   . IVC FILTER INSERTION    . JOINT REPLACEMENT     Left and Right Hip  . PERIPHERAL VASCULAR CATHETERIZATION N/A 04/02/2015   Procedure: Visceral Angiography;  Surgeon: Algernon Huxley, MD;  Location: [place] [facility];  Service: Cardiovascular;  Laterality: N/A;  . PERIPHERAL VASCULAR CATHETERIZATION N/A 04/02/2015   Procedure: Visceral Artery Intervention;  Surgeon: Algernon Huxley, MD;  Location: [place] [facility];  Service: Cardiovascular;  Laterality: N/A;  . PERIPHERAL VASCULAR CATHETERIZATION N/A 05/25/2015   Procedure: IVC Filter Insertion;  Surgeon: Katha Cabal, MD;  Location: [place] [facility];  Service: Cardiovascular;  Laterality: N/A;  . TOE AMPUTATION     right  . TOTAL HIP ARTHROPLASTY       Home Meds: Prior to Admission medications   Medication Sig Start Date End Date Taking? Authorizing Provider  amLODipine (NORVASC) 5 MG tablet  Take 2 tablets (10 mg total) by mouth daily. 10/31/18  Yes Fritzi Mandes, MD  aspirin EC 81 MG tablet Take 81 mg by mouth daily.   Yes [provider]  cholecalciferol (VITAMIN D) 1000 units tablet Take 1,000 Units by mouth daily.   Yes [provider]  ELIQUIS 5 MG TABS tablet TAKE ONE TABLET BY MOUTH TWICE A DAY Patient taking differently: Take 5 mg by mouth 2 (two)  times daily.  11/04/18  Yes Karen Kitchens, NP  estradiol (ESTRACE VAGINAL) 0.1 MG/GM vaginal cream Apply 0.5mg  (pea-sized amount)  just inside the vaginal introitus with a finger-tip on Monday, Wednesday and Friday nights. 08/09/18  Yes McGowan, Larene Beach A, PA-C  folic acid (FOLVITE) 025 MCG tablet Take 400 mcg by mouth daily.   Yes [provider]  gabapentin (NEURONTIN) 300 MG capsule Take 300 mg by mouth at bedtime. 11/23/18  Yes [provider]  ipratropium-albuterol (DUONEB) 0.5-2.5 (3) MG/3ML SOLN Take 3 mLs by nebulization every 4 (four) hours as needed (for shortness of breath/wheezing). 11/10/18  Yes Sudini, Srikar, MD  JAKAFI 15 MG tablet TAKE 1 TABLET (15 MG TOTAL) BY MOUTH 2 TIMES DAILY. Patient taking differently: Take 15 mg by mouth 2 (two) times daily.  12/27/18  Yes Corcoran, Drue Second, MD  pantoprazole (PROTONIX) 40 MG tablet Take 1 tablet (40 mg total) by mouth daily. 09/12/18  Yes [name], MD  albuterol (PROVENTIL HFA;VENTOLIN HFA) 108 (90 Base) MCG/ACT inhaler Inhale 2 puffs into the lungs every 6 (six) hours as needed for wheezing or shortness of breath. 02/20/19   Gouru, Illene Silver, MD  docusate sodium (COLACE) 100 MG capsule Take 100 mg by mouth 2 (two) times daily.    [provider]  furosemide (LASIX) 20 MG tablet Take 20 mg by mouth as needed for fluid (TAKING ATLEAST ONCE A WEEK).  12/14/18   [provider]  ondansetron (ZOFRAN ODT) 4 MG disintegrating tablet Take 1 tablet (4 mg total) by mouth every 8 (eight) hours as needed. 11/26/18   Alfred Levins, [place], MD  polyethylene glycol (MIRALAX / GLYCOLAX) 17 g packet Take 17 g by mouth 4 (four) times a week. Monday, Wednesday, Saturday, Sunday    [provider]  potassium chloride SA (K-DUR,KLOR-CON) 20 MEQ tablet Take 20 mEq by mouth as needed (ONLY TAKE WHEN TAKING LASIX).    [provider]  predniSONE (STERAPRED UNI-PAK 21 TAB) 10 MG (21) TBPK tablet Take 1 tablet (10 mg  total) by mouth daily. Take 6 tablets by mouth for 1 day followed by  5 tablets by mouth for 1 day followed by  4 tablets by mouth for 1 day followed by  3 tablets by mouth for 1 day followed by  2 tablets by mouth for 1 day followed by  1 tablet by mouth for a day and stop 02/20/19   Nicholes Mango, MD    Inpatient Medications:  . aspirin  81 mg Oral Daily  . chlorhexidine  15 mL Mouth Rinse BID  . enoxaparin  100 mg Subcutaneous Q12H  . furosemide  40 mg Intravenous Daily  . insulin aspart  0-9 Units Subcutaneous Q4H  . ipratropium-albuterol  3 mL Nebulization Q6H  . mouth rinse  15 mL Mouth Rinse q12n4p  . metoprolol tartrate  25 mg Oral BID  . potassium chloride  20 mEq Oral Once  . senna-docusate  1 tablet Oral BID  . sodium chloride flush  10-40 mL Intracatheter Q12H  .  sodium chloride flush  3 mL Intravenous Q12H   . sodium chloride Stopped (03/06/19 0016)  . sodium chloride Stopped (03/02/19 1445)  . amiodarone 60 mg/hr (03/13/19 1122)   Followed by  . amiodarone    . fluconazole (DIFLUCAN) IV 50 mL/hr at 03/13/19 1356    Allergies: No Known Allergies  Social History   Socioeconomic History  . Marital status: Widowed    Spouse name: Not on file  . Number of children: 5  . Years of education: Not on file  . Highest education level: Not on file  Occupational History  . Occupation: retired  Scientific laboratory technician  . Financial resource strain: Not very hard  . Food insecurity:    Worry: Patient refused    Inability: Patient refused  . Transportation needs:    Medical: Patient refused    Non-medical: Patient refused  Tobacco Use  . Smoking status: Never Smoker  . Smokeless tobacco: Never Used  Substance and Sexual Activity  . Alcohol use: No  . Drug use: No  . Sexual activity: Never  Lifestyle  . Physical activity:    Days per week: Patient refused    Minutes per session: Patient refused  . Stress: Only a little  Relationships  . Social connections:    Talks on  phone: Patient refused    Gets together: Patient refused    Attends religious service: Patient refused    Active member of club or organization: Patient refused    Attends meetings of clubs or organizations: Patient refused    Relationship status: Patient refused  . Intimate partner violence:    Fear of current or ex partner: Patient refused    Emotionally abused: Patient refused    Physically abused: Patient refused    Forced sexual activity: Patient refused  Other Topics Concern  . Not on file  Social History Narrative   Lives at [facility]     Family History  Problem Relation Age of Onset  . Brain cancer Mother   . Heart attack Father   . COPD Father   . Coronary artery disease Father   . Hypertension Other   . Arthritis-Osteo Other   . Diabetes Brother   . Arthritis Brother   . Osteosarcoma Son   . Bone cancer Son   . Arthritis Sister   . Arthritis Brother   . Hypertension Brother   . Coronary artery disease Brother   . Malignant hyperthermia Brother      Review of Systems: A 12-system review of systems was performed and is negative except as noted in the HPI.  Labs: No results for input(s): CKTOTAL, CKMB, TROPONINI in the last 72 hours. Lab Results  Component Value Date   WBC 16.6 (H) 03/12/2019   HGB 8.1 (L) 03/12/2019   HCT 29.4 (L) 03/12/2019   MCV 87.0 03/12/2019   PLT 536 (H) 03/12/2019    Recent Labs  Lab 03/13/19 0542  NA 145  K 3.8  CL 107  CO2 30  BUN 22  CREATININE 0.50  CALCIUM 8.9  GLUCOSE 113*   Lab Results  Component Value Date   CHOL 104 08/31/2018   HDL 35 (L) 08/31/2018   LDLCALC 64 08/31/2018   TRIG 25 08/31/2018   No results found for: DDIMER  Radiology/Studies:  Dg Chest 1 View  Result Date: 02/15/2019 CLINICAL DATA:  Acute shortness of breath and hypoxia. EXAM: CHEST  1 VIEW COMPARISON:  02/18/2019 chest radiograph, chest CT and prior studies FINDINGS: Cardiomediastinal  silhouette is unchanged. RIGHT basilar  atelectasis again noted. There is no evidence of focal airspace disease, pulmonary edema, suspicious pulmonary nodule/mass, pleural effusion, or pneumothorax. No acute bony abnormalities are identified. IMPRESSION: RIGHT basilar atelectasis. Electronically Signed   By: Margarette Canada M.D.   On: 03/01/2019 13:39   Dg Abd 1 View  Result Date: 03/01/2019 CLINICAL DATA:  Nasogastric tube placement. EXAM: ABDOMEN - 1 VIEW COMPARISON:  Radiographs of November 26, 2018. FINDINGS: The bowel gas pattern is normal. Surgical staples are seen in the left side of the abdomen. IVC filter is noted. Distal tip of nasogastric tube is seen in expected position of distal stomach or proximal duodenum. IMPRESSION: Distal tip of nasogastric tube seen in expected position of distal stomach or proximal duodenum. Electronically Signed   By: Marijo Conception M.D.   On: 03/01/2019 11:10   Ct Head Wo Contrast  Result Date: 03/09/2019 CLINICAL DATA:  85 year old female with altered mental status. EXAM: CT HEAD WITHOUT CONTRAST TECHNIQUE: Contiguous axial images were obtained from the base of the skull through the vertex without intravenous contrast. COMPARISON:  08/30/2018 MR, 08/30/2018 CT and prior studies FINDINGS: Brain: No evidence of acute infarction, hemorrhage, hydrocephalus, extra-axial collection or mass lesion/mass effect. Atrophy and mild chronic small-vessel white matter ischemic changes noted. Vascular: Atherosclerotic calcifications again noted. Skull: Normal. Negative for fracture or focal lesion. Sinuses/Orbits: No acute finding. Other: None. IMPRESSION: 1. No evidence of acute intracranial abnormality. 2. Atrophy and mild chronic small-vessel white matter ischemic changes. Electronically Signed   By: Margarette Canada M.D.   On: 03/09/2019 12:38   Ct Chest Wo Contrast  Result Date: 02/18/2019 CLINICAL DATA:  Shortness of breath for the last 2 days. History of COPD on 2 L of oxygen, CHF, history of pulmonary embolism and other  medical problems. Occasional dry cough. No fever. EXAM: CT CHEST WITHOUT CONTRAST TECHNIQUE: Multidetector CT imaging of the chest was performed following the standard protocol without IV contrast. COMPARISON:  Chest CTs dated 11/22/2018 and 09/21/2018. FINDINGS: Cardiovascular: Aortic atherosclerosis. No thoracic aortic aneurysm. Cardiomegaly. No pericardial effusion seen. Prominent mitral annulus calcifications again noted. Mediastinum/Nodes: No mass or enlarged lymph nodes seen within the mediastinum or perihilar regions. Esophagus is unremarkable. Trachea appears normal. Lungs/Pleura: Chronic consolidation at the RIGHT lung base, not appreciably changed compared to CT abdomen of 12/03/2018, questionably increased compared to earlier chest CT exams. Additional mild chronic appearing atelectasis at the LEFT lung base. Lungs otherwise clear. No pleural effusion or pneumothorax seen. No consolidations or ground-glass opacities to suggest COVID-19 infection. Upper Abdomen: Large gallstone within the gallbladder, incompletely imaged, also seen on previous exams. RIGHT renal cyst, partially imaged. No acute findings within the upper abdomen. Musculoskeletal: Degenerative change throughout the scoliotic thoracolumbar spine, moderate in degree. Advanced degenerative changes at the bilateral shoulders, incompletely imaged. No acute or suspicious osseous finding. IMPRESSION: 1. No acute findings. No evidence of active pneumonia or pulmonary edema. 2. Chronic consolidation/atelectasis at the RIGHT lung base, stable compared to CT abdomen of 12/03/2018. Mild chronic-appearing atelectasis at the LEFT lung base. 3. Cardiomegaly. No pericardial effusion. 4. Additional chronic/incidental findings detailed above. Aortic Atherosclerosis (ICD10-I70.0). Electronically Signed   By: Franki Cabot M.D.   On: 02/18/2019 14:54   Dg Chest Port 1 View  Result Date: 03/11/2019 CLINICAL DATA:  Respiratory failure.  Ventilator support.  EXAM: PORTABLE CHEST 1 VIEW COMPARISON:  03/10/2019 FINDINGS: Endotracheal tube tip is 3 cm above the carina. Orogastric or nasogastric tube enters the stomach.  Persistent bilateral lower lobe atelectasis and or pneumonia. The upper lungs remain clear. No significant change since yesterday. IMPRESSION: No change since yesterday. Endotracheal tube and orogastric tube well positioned. Persistent bilateral lower lobe atelectasis and or. Electronically Signed   By: Nelson Chimes M.D.   On: 03/11/2019 08:09   Dg Chest Port 1 View  Result Date: 03/10/2019 CLINICAL DATA:  Respiratory failure. EXAM: PORTABLE CHEST 1 VIEW COMPARISON:  03/09/2019 FINDINGS: Endotracheal tube remains with the tip approximately 3 cm above the carina. Stable cardiac enlargement. Gastric decompression tube extends into the stomach. Fairly stable bilateral lower lobe consolidation. There may remain small bilateral pleural effusions. No overt edema or evidence of pneumothorax. Enlarged main pulmonary artery contour. IMPRESSION: Stable bilateral lower lobe consolidation with probable small bilateral pleural effusions. Electronically Signed   By: Aletta Edouard M.D.   On: 03/10/2019 10:27   Dg Chest Port 1 View  Result Date: 03/09/2019 CLINICAL DATA:  Acute respiratory failure. EXAM: PORTABLE CHEST 1 VIEW COMPARISON:  One-view chest x-ray 03/01/2019 FINDINGS: Endotracheal tube terminates 2.5 cm above the carina. Heart is mildly enlarged. Mitral annular calcifications are again seen. Aortic atherosclerosis is present. NG tube courses off the inferior border the film. Progressive bibasilar airspace disease is present, right greater than left. Bilateral effusions are suspected. IMPRESSION: 1. Increasing bilateral lower lobe airspace disease/pneumonia. 2. Suspect bilateral pleural effusions. 3. Endotracheal tube has advanced, now terminating 2.5 cm above the carina. Electronically Signed   By: San Morelle M.D.   On: 03/09/2019 07:38    Dg Chest Port 1 View  Result Date: 03/01/2019 CLINICAL DATA:  Status post intubation. EXAM: PORTABLE CHEST 1 VIEW COMPARISON:  Radiograph of February 27, 2019. FINDINGS: Stable cardiomegaly. Atherosclerosis of thoracic aorta is noted. Endotracheal tube is in grossly good position. No pneumothorax is noted. Minimal left basilar subsegmental atelectasis is noted with small left pleural effusion. Right lung is clear. Severe degenerative changes seen involving the right glenohumeral joint. IMPRESSION: Endotracheal tube in grossly good position. Minimal left basilar subsegmental atelectasis is noted with minimal left pleural effusion. Aortic Atherosclerosis (ICD10-I70.0). Electronically Signed   By: Marijo Conception M.D.   On: 03/01/2019 11:12   Dg Chest Port 1 View  Result Date: 02/27/2019 CLINICAL DATA:  Respiratory failure, fever EXAM: PORTABLE CHEST 1 VIEW COMPARISON:  02/13/2019 FINDINGS: Cardiomegaly. Right basilar atelectasis or infiltrate again noted, slightly worsened since prior study. No confluent opacity on the left. Possible small right effusion. No acute bony abnormality. Advanced degenerative changes in the shoulders. IMPRESSION: Cardiomegaly. Right lower lobe atelectasis or pneumonia with small right effusion. Electronically Signed   By: Rolm Baptise M.D.   On: 02/27/2019 23:12   Dg Chest Port 1 View  Result Date: 02/18/2019 CLINICAL DATA:  Short of breath EXAM: PORTABLE CHEST 1 VIEW COMPARISON:  11/08/2018 FINDINGS: Cardiac enlargement. Mild vascular congestion without edema. Elevated right hemidiaphragm with right lower lobe atelectasis unchanged. Left lung clear. Advanced degenerative change in both shoulders. IMPRESSION: Chronic elevation right hemidiaphragm with right lower lobe atelectasis Mild vascular congestion without edema. Electronically Signed   By: Franchot Gallo M.D.   On: 02/18/2019 12:25   [place] Ekg Site Rite  Result Date: 03/02/2019 If Site Rite image not attached, placement  could not be confirmed due to current cardiac rhythm.   Wt Readings from Last 3 Encounters:  03/13/19 100.8 kg  02/18/19 89.1 kg  02/02/19 89.1 kg    EKG: Sinus rhythm with intermittent atrial fibrillation with rapid ventricular response  Physical Exam: Acutely chronically ill African-American female Blood pressure (!) 144/77, pulse 77, temperature 98 F (36.7 C), resp. rate (!) 33, height 5' 2.01" (1.575 m), weight 100.8 kg, SpO2 98 %. Body mass index is 40.63 kg/m. General: Well developed, well nourished, in no acute distress. Head: Normocephalic, atraumatic, sclera non-icteric, no xanthomas, nares are without discharge.  Neck: Negative for carotid bruits. JVD not elevated. Lungs: Clear bilaterally to auscultation without wheezes, rales, or rhonchi. Breathing is unlabored. Heart: RRR with S1 S2. No murmurs, rubs, or gallops appreciated. Abdomen: Soft, non-tender, non-distended with normoactive bowel sounds. No hepatomegaly. No rebound/guarding. No obvious abdominal masses. Msk:  Strength and tone appear normal for age. Extremities: No clubbing or cyanosis. No edema.  Distal pedal pulses are 2+ and equal bilaterally.      Assessment and Plan  Patient with history of pulmonary embolus and DVT as well as COPD and dementia recently admitted with respiratory failure requiring intubation.  After extubation patient has had intermittent episodes of atrial fibrillation with rapid ventricular response.  Blood pressure is relatively soft.  Will add amiodarone load and follow for improvement.  Signed, Teodoro Spray MD 03/13/2019, 1:56 PM Pager: [phone]

## 2019-03-14 ENCOUNTER — Inpatient Hospital Stay
Admit: 2019-03-14 | Discharge: 2019-03-14 | Disposition: A | Discharge status: Home / Self Care | Payer: Medicare Other | Attending: Pulmonary Disease | Admitting: Pulmonary Disease

## 2019-03-14 LAB — BASIC METABOLIC PANEL
Anion gap: 6 (ref 5–15)
BUN: 23 mg/dL (ref 8–23)
CO2: 33 mmol/L — ABNORMAL HIGH (ref 22–32)
Calcium: 8.4 mg/dL — ABNORMAL LOW (ref 8.9–10.3)
Chloride: 107 mmol/L (ref 98–111)
Creatinine, Ser: 0.46 mg/dL (ref 0.44–1.00)
GFR calc Af Amer: >60 mL/min (ref >60)
GFR calc non Af Amer: >60 mL/min (ref >60)
Glucose, Bld: 139 mg/dL — ABNORMAL HIGH (ref 70–99)
Potassium: 3.4 mmol/L — ABNORMAL LOW (ref 3.5–5.1)
Sodium: 146 mmol/L — ABNORMAL HIGH (ref 135–145)

## 2019-03-14 LAB — BLOOD GAS, ARTERIAL
Acid-Base Excess: 5.6 mmol/L — ABNORMAL HIGH (ref 0.0–2.0)
Bicarbonate: 37.4 mmol/L — ABNORMAL HIGH (ref 20.0–28.0)
Delivery systems: POSITIVE
Expiratory PAP: 8
FIO2: 0.5
Inspiratory PAP: 16
O2 Saturation: 95.6 %
PEEP: 8 cmH2O
Patient temperature: 37
RATE: 16 resp/min
pCO2 arterial: 98 mmHg (ref 32.0–48.0)
pH, Arterial: 7.19 — CL (ref 7.350–7.450)
pO2, Arterial: 96 mmHg (ref 83.0–108.0)

## 2019-03-14 LAB — ECHOCARDIOGRAM COMPLETE
Height: 62.008 in
Weight: 3453.29 oz

## 2019-03-14 LAB — GLUCOSE, CAPILLARY
Glucose-Capillary: 107 mg/dL — ABNORMAL HIGH (ref 70–99)
Glucose-Capillary: 112 mg/dL — ABNORMAL HIGH (ref 70–99)
Glucose-Capillary: 117 mg/dL — ABNORMAL HIGH (ref 70–99)
Glucose-Capillary: 126 mg/dL — ABNORMAL HIGH (ref 70–99)
Glucose-Capillary: 135 mg/dL — ABNORMAL HIGH (ref 70–99)
Glucose-Capillary: 139 mg/dL — ABNORMAL HIGH (ref 70–99)

## 2019-03-14 LAB — MAGNESIUM: Magnesium: 2.1 mg/dL (ref 1.7–2.4)

## 2019-03-14 MED ORDER — POTASSIUM CHLORIDE 20 MEQ PO PACK: 40.0000 meq | PACK | Freq: Once | ORAL | Status: DC

## 2019-03-14 MED ORDER — MORPHINE SULFATE (PF) 2 MG/ML IV SOLN
2.0000 mg | Freq: Once | INTRAVENOUS | Status: AC
  Administered 2019-03-14: 2 mg via INTRAVENOUS
  Filled 2019-03-14: qty 1

## 2019-03-14 MED ORDER — POTASSIUM CHLORIDE 20 MEQ PO PACK: 20.0000 meq | PACK | Freq: Once | ORAL | Status: DC

## 2019-03-14 NOTE — Progress Notes (Signed)
Discussed decline in pts condition with pts granddaughter [name] pts POA and pts son Conley Canal via telephone.  I informed both family members Mrs. Mehl respiratory status has declined she is currently requiring Bipap and mentation has declined.  I also discussed pts code status and they have agreed if pt continues to decline from a respiratory standpoint they would NOT want pt mechanically intubated.  We also discussed CPR/ACLS they stated they have not made a decision regarding this, but they will continue to discuss this.  At this time they would want CPR/ACLS if pt were to cardiac arrest.  Therefore, will change code status to Limited Code (no mechanical intubation/ventilation).  All questions were answered.    Marda Stalker, [place] Pager [phone] (please enter 7 digits) PCCM Consult Pager [phone] (please enter 7 digits)

## 2019-03-14 NOTE — Progress Notes (Signed)
[place] [facility] [place] NAME: Yolanda Wells    MR#:  [id number]  DATE OF BIRTH:  [birth year]    CHIEF COMPLAINT:   Chief Complaint  Patient presents with  . Altered Mental Status  . Shortness of Breath   Patient lethargic and had to be placed back on BiPAP.  Significantly elevated PCO2 on ABG.  REVIEW OF SYSTEMS:  Unable to obtain due to lethargy  DRUG ALLERGIES:  No Known Allergies  VITALS:  Blood pressure 97/64, pulse 65, temperature (!) 96.9 F (36.1 C), temperature source Axillary, resp. rate (!) 29, height 5' 2.01" (1.575 m), weight 97.9 kg, SpO2 93 %.  PHYSICAL EXAMINATION:   Physical Exam  GENERAL:  85 y.o.-year-old patient lying in the bed, obese.  Lethargic EYES: Pupils equal, round, reactive to light and accommodation. No scleral icterus. Extraocular muscles intact.  HEENT: Head atraumatic, normocephalic.  NECK:  Supple, no jugular venous distention. No thyroid enlargement, no tenderness.  LUNGS: Decrease breath sounds bilatera CARDIOVASCULAR: S1, S2 normal. No murmurs, rubs, or gallops.  ABDOMEN: Soft, nontender, nondistended. Bowel sounds present. No organomegaly or mass.  EXTREMITIES: No cyanosis, clubbing. LE edema. PSYCHIATRIC: Lethargic  LABORATORY PANEL:   CBC Recent Labs  Lab 03/12/19 0628  WBC 16.6*  HGB 8.1*  HCT 29.4*  PLT 536*   ------------------------------------------------------------------------------------------------------------------ Chemistries  Recent Labs  Lab 03/14/19 0408  NA 146*  K 3.4*  CL 107  CO2 33*  GLUCOSE 139*  BUN 23  CREATININE 0.46  CALCIUM 8.4*  MG 2.1   ------------------------------------------------------------------------------------------------------------------  Cardiac Enzymes No results for input(s): TROPONINI in the last 168  hours. ------------------------------------------------------------------------------------------------------------------  RADIOLOGY:  No results found.   ASSESSMENT AND PLAN:   * Acute on chronic hypoxic hypercarbic respiratory failure failure due to COPD exacerbation,, pneumonia, treated with IV antibiotics vancomycin, cefepime, bronchodilators.Finish steroid course.   Worsening today with PCO2 high and respiratory acidosis on ABG.  Placed back on BiPAP.  Discussed with Dr. Patsey Berthold.  Seems appropriate for comfort care.  Palliative care following.  * Pulm HTN, COPD and now PNA Extubated 03/11/2019 Intensivist follow-up. Discussed with Dr. Lanney Gins On 2 L O2  * RLL HCAP Treated with IV vancomycin and cefepime. Finished course  *Hypotension Improved  * Acute metabolic encephalopathy secondary to hypercarbia.  Has baseline dementia. Has NIV set up at home.  * Atrial  fibrillation with RVR, SVT On therapeutic dose of Lovenox Unable to use scheduled rate control medications due to hypotension  On amiodarone.  *COPD exacerbation-improved Continue nebulization therapy Treated with IV steroids and now stopped  *DVT prophylaxis Lovenox-therapeutic dose  * Dementia, agitation, continue Seroquel, Ativan. Palliative care consulted and following.   Waiting for family to come in today for further discussion regarding CODE STATUS/comfort care  All the records are reviewed and case discussed with Care Management/Social Worker Management plans discussed with the patient, family and they are in agreement.  CODE STATUS: FULL CODE  TOTAL TIME TAKING CARE OF THIS PATIENT: 35 minutes.   Yolanda Wells M.D on 03/14/2019 at 10:30 AM  Between 7am to 6pm - Pager - [phone]  After 6pm go to [email or web address] - password [email or web address] [facility]  Office  [phone]  CC: Primary care physician; Yolanda Boys, MD  Note: This dictation was prepared with Dragon  dictation along with smaller phrase technology. Any transcriptional errors that result from this process are unintentional.

## 2019-03-14 NOTE — TOC Progression Note (Signed)
Transition of Care [facility]) - Progression Note    Patient Details  Name: Yolanda Wells MRN: [id number] Date of Birth: [birth year]  [facility] of [facility] CM/SW Contact  Su Hilt, RN Phone Number: 03/14/2019, 12:31 PM  Clinical Narrative:    Patient is able to follow simple commands Palliative care following to have Echo today, Family not on board for Comfort care yet, Palliative to talk with family again today    Expected Discharge Plan: [place] Barriers to Discharge: Continued Medical Work up  Expected Discharge Plan and Services Expected Discharge Plan: [place] arrangements for the past 2 months: [place] Agency: [place]         Social Determinants of Health (SDOH) Interventions    Readmission Risk Interventions Readmission Risk Prevention Plan 02/23/2019 09/25/2018 09/01/2018  Transportation Screening Complete Complete Complete  Medication Review [employer] photographer) Complete Complete Complete  PCP or Specialist appointment within 3-5 days of discharge - Complete Complete  HRI or Home Care Consult Complete Complete Complete  SW Recovery Care/Counseling Consult - Complete Complete  Palliative Care Screening Complete Complete Complete  Medication Reconcilation (Pharmacy) - Complete Complete  Skilled Nursing Facility Not Applicable Complete Not Complete  Some recent data might be hidden

## 2019-03-14 NOTE — Progress Notes (Signed)
[facility] [place] NAME: Yolanda Wells    MR#:  [id number]  DATE OF BIRTH:  1932/07/23    CHIEF COMPLAINT:   Chief Complaint  Patient presents with  . Altered Mental Status  . Shortness of Breath   Patient is awake and confused. Mitts on  REVIEW OF SYSTEMS:  confused  DRUG ALLERGIES:  No Known Allergies  VITALS:  Blood pressure 97/64, pulse 65, temperature (!) 96.9 F (36.1 C), temperature source Axillary, resp. rate (!) 29, height 5' 2.01" (1.575 m), weight 97.9 kg, SpO2 93 %.  PHYSICAL EXAMINATION:   Physical Exam  GENERAL:  85 y.o.-year-old patient lying in the bed, obese.  Awake but confused EYES: Pupils equal, round, reactive to light and accommodation. No scleral icterus. Extraocular muscles intact.  HEENT: Head atraumatic, normocephalic.  NECK:  Supple, no jugular venous distention. No thyroid enlargement, no tenderness.  LUNGS: Decrease breath sounds bilatera CARDIOVASCULAR: S1, S2 normal. No murmurs, rubs, or gallops.  ABDOMEN: Soft, nontender, nondistended. Bowel sounds present. No organomegaly or mass.  EXTREMITIES: No cyanosis, clubbing. LE edema. PSYCHIATRIC: Confused  LABORATORY PANEL:   CBC Recent Labs  Lab 03/12/19 0628  WBC 16.6*  HGB 8.1*  HCT 29.4*  PLT 536*   ------------------------------------------------------------------------------------------------------------------ Chemistries  Recent Labs  Lab 03/14/19 0408  NA 146*  K 3.4*  CL 107  CO2 33*  GLUCOSE 139*  BUN 23  CREATININE 0.46  CALCIUM 8.4*  MG 2.1   ------------------------------------------------------------------------------------------------------------------  Cardiac Enzymes No results for input(s): TROPONINI in the last 168 hours. ------------------------------------------------------------------------------------------------------------------  RADIOLOGY:  No results found.   ASSESSMENT AND PLAN:   * Acute on  chronic hypoxic hypercarbic respiratory failure failure due to COPD exacerbation,, pneumonia, treated with IV antibiotics vancomycin, cefepime, bronchodilators.Finish steroid course.   Unfortunately patient is dependent on BiPAP/vent.  NIV set up at home. Monitor closely  * Pulm HTN, COPD and now PNA Extubated 03/11/2019 Intensivist follow-up. Discussed with Dr. Lanney Gins On 2 L O2  * RLL HCAP Treated with IV vancomycin and cefepime. Finished course  *Hypotension Improved  * Acute metabolic encephalopathy secondary to hypercarbia.  Has baseline dementia. Has NIV set up at home.  * Atrial  fibrillation with RVR, SVT On therapeutic dose of Lovenox Unable to use scheduled rate control medications due to hypotension   *COPD exacerbation-improved Continue nebulization therapy Treated with IV steroids and now stopped  *DVT prophylaxis Lovenox-therapeutic dose  * Dementia, agitation, continue Seroquel, Ativan. Palliative care consulted and following.   Waiting for family to come in today for further discussion regarding CODE STATUS/comfort care  All the records are reviewed and case discussed with Care Management/Social Worker Management plans discussed with the patient, family and they are in agreement.  CODE STATUS: FULL CODE  TOTAL TIME TAKING CARE OF THIS PATIENT: 35 minutes.   Yolanda Wells M.D on 03/14/2019 at 10:26 AM  Between 7am to 6pm - Pager - [phone]  After 6pm go to [email or web address] - password [email or web address] [place] [facility]  Office  [phone]  CC: Primary care physician; [name], MD  Note: This dictation was prepared with Dragon dictation along with smaller phrase technology. Any transcriptional errors that result from this process are unintentional.

## 2019-03-14 NOTE — Progress Notes (Signed)
Issues with respiratory distress this morning.  Tachypneic and obtunded.  Recurrent episodes of PSVT -occurring more frequently despite amiodarone infusion.  Required going back on AVAPS, arterial blood gas: pH 7.19, PaCO2, PaO2 98, on BiPAP.  Switched to AVAPS at that point.  ROS: Patient cannot provide due to obtundation/critical illness  Vitals:   03/14/19 0700 03/14/19 0730 03/14/19 0745 03/14/19 0800  BP: (!) 109/58 (!) 102/52 102/79 97/64  Pulse: 68 67 70 65  Resp: (!) 29 (!) 30 (!) 30 (!) 29  Temp:  (!) 96.9 F (36.1 C)    TempSrc:  Axillary    SpO2: 95% 96% 92% 93%  Weight:      Height:         Gen: Tachypneic, obtunded, on BiPAP HEENT: NCAT, sclerae anicteric, edentulous Neck: No JVD noted Lungs: breath sounds coarse without wheezes, tachypneic with thoracoabdominal asynchrony which improved with AVAPS. 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  BMP Latest Ref Rng & Units 03/14/2019 03/13/2019 03/12/2019  Glucose 70 - 99 mg/dL 139(H) 113(H) 118(H)  BUN 8 - 23 mg/dL 23 22 25(H)  Creatinine 0.44 - 1.00 mg/dL 0.46 0.50 0.41(L)  Sodium 135 - 145 mmol/L 146(H) 145 143  Potassium 3.5 - 5.1 mmol/L 3.4(L) 3.8 4.1  Chloride 98 - 111 mmol/L 107 107 111  CO2 22 - 32 mmol/L 33(H) 30 29  Calcium 8.9 - 10.3 mg/dL 8.4(L) 8.9 8.4(L)   CBC Latest Ref Rng & Units 03/12/2019 03/11/2019 03/10/2019  WBC 4.0 - 10.5 K/uL 16.6(H) 11.4(H) 11.5(H)  Hemoglobin 12.0 - 15.0 g/dL 8.1(L) 7.5(L) 7.6(L)  Hematocrit 36.0 - 46.0 % 29.4(L) 27.3(L) 27.1(L)  Platelets 150 - 400 K/uL 536(H) 335 319   CXR: No new film  IMPRESSION: Acute on chronic hypoxemic/hypercarbic respiratory failure History of severe COPD/Restrictive physiology Pulmonary hypertension, likely group  4 Recurrent PSVT, uncontrolled  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  Extubated 5/2. Continue AVAPS for now.  Patient has failed to improved even with mechanical ventilation previously. Continue bronchodilators as needed Continue amiodarone infusion, follow cardiology recommendations, replete potassium. Monitor H/H Wound care Continue Lovenox prophylaxis Baseline: wheelchair dep. Dep. of assistance with ADLs, generalized debility. Has remained encephalopathic throughout her admission, has underlying multi-infarct dementia.   I have discussed the case with Madolyn Frieze, granddaughter and POA of the patient via telephone.  During the time of discussion patient's brother was also on the line.  I have made them aware that I believe that a palliative approach to the patient's issues is the only realistic course of action.  Patient has failed to improve over the 3-1/2 weeks that she has been admitted.  She continues to have issues with hypercarbia which are multifactorial.  They are to call back with decisions with regards to [place].  I made it very clear that I do not think this patient can participate in end-of-life discussion given her underlying dementia and ongoing issues with encephalopathy.      C.  Derrill Kay, MD [place] PCCM   03/14/2019 8:28 AM

## 2019-03-14 NOTE — Progress Notes (Signed)
*  PRELIMINARY RESULTS* Echocardiogram 2D Echocardiogram has been performed.  Yolanda Wells 03/14/2019, 11:49 AM

## 2019-03-14 NOTE — Progress Notes (Signed)
Patient with diminished breath sounds, increased work of breathing, and obtunded.  Dr. Patsey Berthold notified.  Order obtained for stat ABG and BiPAP.

## 2019-03-14 NOTE — Progress Notes (Signed)
Initial Nutrition Assessment  RD working remotely.  DOCUMENTATION CODES:   Obesity unspecified  INTERVENTION:  No nutrition intervention appropriate at this time. Will continue to monitor plan of care and goals of care.  NUTRITION DIAGNOSIS:   Inadequate oral intake related to inability to eat as evidenced by NPO status.  Ongoing.  GOAL:   Provide needs based on ASPEN/SCCM guidelines  Not met.  MONITOR:   Vent status, Labs, Weight trends, TF tolerance, Skin, I & O's  REASON FOR ASSESSMENT:   Ventilator, Consult Enteral/tube feeding initiation and management  ASSESSMENT:   85 year old female with PMHx of HTN, hx DVT, arthritis, hx CVA 2016, diverticulosis, CHF, dementia admitted with acute exacerbation of COPD requiring intubation on 4/21.  Patient was extubated on 5/1 and OGT removed. She was assessed by SLP on 5/2 and was allowed pleasure teaspoons of honey-thick liquids or applesauce supervised by RN. Early this AM patient had increased work of breathing requiring BiPAP and became obtunded. Patient now inappropriate for any oral intake.  Medications reviewed and include: Lasix 40 mg daily IV, Novolog 0-9 units Q4hrs, potassium chloride 40 mEq one today (unable to be given as pt obtunded), senna-docusate 1 tablet BID, amiodarone gtt.  Labs reviewed: CBG 126-139, Sodium 146, Potassium 3.4, CO2 33. ABG from this AM with arterial pCO2 98.  I/O: 1500 mL UOP yesterday (0.6 mL/kg/hr)  Weight trend: 97.9 kg on 5/4; +9.9 kg from 4/13  Diet Order:   Diet Order            Diet NPO time specified  Diet effective now             EDUCATION NEEDS:   No education needs have been identified at this time  Skin:  Skin Assessment: Skin Integrity Issues:(stg II buttocks; MSAD buttocks; ecchymosis; s/p amputation of toes)  Last BM:  03/13/2019 - small type 7 per colostomy  Height:   Ht Readings from Last 1 Encounters:  03/11/19 5' 2.01" (1.575 m)   Weight:   Wt Readings  from Last 1 Encounters:  03/14/19 97.9 kg   Ideal Body Weight:  50 kg  BMI:  Body mass index is 39.47 kg/m.  Estimated Nutritional Needs:   Kcal:  1700-1900 (MSJ x 1.3-1.5)  Protein:  100 grams (2 grams/kg IBW)  Fluid:  1.25 L/day (25 mL/kg IBW)  Willey Blade, MS, RD, LDN Office: [phone] Pager: [phone] After Hours/Weekend Pager: [phone]

## 2019-03-14 NOTE — Progress Notes (Signed)
Patient Name: Yolanda Wells Date of Encounter: 03/14/2019  Hospital Problem List     Active Problems:   Acute on chronic respiratory failure ([place])   Acute respiratory failure with hypoxia and hypercapnia ([place])    Patient Profile     Pt with dementia aadmitted with respiratory failure and afib with rvr. No on iv amiodaoren and in nsr.   Subjective   Non verbal  Inpatient Medications    . aspirin  81 mg Oral Daily  . chlorhexidine  15 mL Mouth Rinse BID  . enoxaparin  100 mg Subcutaneous Q12H  . furosemide  40 mg Intravenous Daily  . insulin aspart  0-9 Units Subcutaneous Q4H  . mouth rinse  15 mL Mouth Rinse q12n4p  . metoprolol tartrate  25 mg Oral BID  . potassium chloride  20 mEq Oral Once  . potassium chloride  40 mEq Per Tube Once  . senna-docusate  1 tablet Oral BID  . sodium chloride flush  10-40 mL Intracatheter Q12H  . sodium chloride flush  3 mL Intravenous Q12H    Vital Signs    Vitals:   03/14/19 1500 03/14/19 1600 03/14/19 1700 03/14/19 1800  BP: (!) 125/58 123/73 115/75 120/68  Pulse: 76 86 89 86  Resp: (!) 33 (!) 26 (!) 33 (!) 25  Temp:   99 F (37.2 C)   TempSrc:   Axillary   SpO2: 92% 94% 93% 97%  Weight:      Height:        Intake/Output Summary (Last 24 hours) at 03/14/2019 1835 Last data filed at 03/14/2019 1800 Gross per 24 hour  Intake 710.81 ml  Output 800 ml  Net -89.19 ml   Filed Weights   03/12/19 0247 03/13/19 0500 03/14/19 0448  Weight: 100 kg 100.8 kg 97.9 kg    Physical Exam    GEN: Well nourished, well developed, in no acute distress.  HEENT: normal.  Neck: Supple, no JVD, carotid bruits, or masses. Cardiac: RRR, no murmurs, rubs, or gallops. No clubbing, cyanosis, edema.  Radials/DP/PT 2+ and equal bilaterally.  Respiratory:  Respirations regular and unlabored, clear to auscultation bilaterally. GI: Soft, nontender, nondistended, BS + x 4. MS: no deformity or atrophy. Skin: warm and dry, no rash. Neuro:  Strength and  sensation are intact. Psych: Normal affect.  Labs    CBC Recent Labs    03/12/19 0628  WBC 16.6*  NEUTROABS 14.6*  HGB 8.1*  HCT 29.4*  MCV 87.0  PLT 716*   Basic Metabolic Panel Recent Labs    03/12/19 0443 03/13/19 0542 03/14/19 0408  NA 143 145 146*  K 4.1 3.8 3.4*  CL 111 107 107  CO2 29 30 33*  GLUCOSE 118* 113* 139*  BUN 25* 22 23  CREATININE 0.41* 0.50 0.46  CALCIUM 8.4* 8.9 8.4*  MG 1.9 2.0 2.1  PHOS 3.3 3.5  --    Liver Function Tests No results for input(s): AST, ALT, ALKPHOS, BILITOT, PROT, ALBUMIN in the last 72 hours. No results for input(s): LIPASE, AMYLASE in the last 72 hours. Cardiac Enzymes No results for input(s): CKTOTAL, CKMB, CKMBINDEX, TROPONINI in the last 72 hours. BNP No results for input(s): BNP in the last 72 hours. D-Dimer No results for input(s): DDIMER in the last 72 hours. Hemoglobin A1C No results for input(s): HGBA1C in the last 72 hours. Fasting Lipid Panel No results for input(s): CHOL, HDL, LDLCALC, TRIG, CHOLHDL, LDLDIRECT in the last 72 hours. Thyroid Function Tests No  results for input(s): TSH, T4TOTAL, T3FREE, THYROIDAB in the last 72 hours.  Invalid input(s): FREET3  Telemetry    nsr  ECG       Radiology    Dg Chest 1 View  Result Date: 02/19/2019 CLINICAL DATA:  Acute shortness of breath and hypoxia. EXAM: CHEST  1 VIEW COMPARISON:  02/18/2019 chest radiograph, chest CT and prior studies FINDINGS: Cardiomediastinal silhouette is unchanged. RIGHT basilar atelectasis again noted. There is no evidence of focal airspace disease, pulmonary edema, suspicious pulmonary nodule/mass, pleural effusion, or pneumothorax. No acute bony abnormalities are identified. IMPRESSION: RIGHT basilar atelectasis. Electronically Signed   By: Margarette Canada M.D.   On: 03/09/2019 13:39   Dg Abd 1 View  Result Date: 03/01/2019 CLINICAL DATA:  Nasogastric tube placement. EXAM: ABDOMEN - 1 VIEW COMPARISON:  Radiographs of November 26, 2018.  FINDINGS: The bowel gas pattern is normal. Surgical staples are seen in the left side of the abdomen. IVC filter is noted. Distal tip of nasogastric tube is seen in expected position of distal stomach or proximal duodenum. IMPRESSION: Distal tip of nasogastric tube seen in expected position of distal stomach or proximal duodenum. Electronically Signed   By: Marijo Conception M.D.   On: 03/01/2019 11:10   Ct Head Wo Contrast  Result Date: 02/27/2019 CLINICAL DATA:  85 year old female with altered mental status. EXAM: CT HEAD WITHOUT CONTRAST TECHNIQUE: Contiguous axial images were obtained from the base of the skull through the vertex without intravenous contrast. COMPARISON:  08/30/2018 MR, 08/30/2018 CT and prior studies FINDINGS: Brain: No evidence of acute infarction, hemorrhage, hydrocephalus, extra-axial collection or mass lesion/mass effect. Atrophy and mild chronic small-vessel white matter ischemic changes noted. Vascular: Atherosclerotic calcifications again noted. Skull: Normal. Negative for fracture or focal lesion. Sinuses/Orbits: No acute finding. Other: None. IMPRESSION: 1. No evidence of acute intracranial abnormality. 2. Atrophy and mild chronic small-vessel white matter ischemic changes. Electronically Signed   By: Margarette Canada M.D.   On: 02/10/2019 12:38   Ct Chest Wo Contrast  Result Date: 02/18/2019 CLINICAL DATA:  Shortness of breath for the last 2 days. History of COPD on 2 L of oxygen, CHF, history of pulmonary embolism and other medical problems. Occasional dry cough. No fever. EXAM: CT CHEST WITHOUT CONTRAST TECHNIQUE: Multidetector CT imaging of the chest was performed following the standard protocol without IV contrast. COMPARISON:  Chest CTs dated 11/22/2018 and 09/21/2018. FINDINGS: Cardiovascular: Aortic atherosclerosis. No thoracic aortic aneurysm. Cardiomegaly. No pericardial effusion seen. Prominent mitral annulus calcifications again noted. Mediastinum/Nodes: No mass or  enlarged lymph nodes seen within the mediastinum or perihilar regions. Esophagus is unremarkable. Trachea appears normal. Lungs/Pleura: Chronic consolidation at the RIGHT lung base, not appreciably changed compared to CT abdomen of 12/03/2018, questionably increased compared to earlier chest CT exams. Additional mild chronic appearing atelectasis at the LEFT lung base. Lungs otherwise clear. No pleural effusion or pneumothorax seen. No consolidations or ground-glass opacities to suggest COVID-19 infection. Upper Abdomen: Large gallstone within the gallbladder, incompletely imaged, also seen on previous exams. RIGHT renal cyst, partially imaged. No acute findings within the upper abdomen. Musculoskeletal: Degenerative change throughout the scoliotic thoracolumbar spine, moderate in degree. Advanced degenerative changes at the bilateral shoulders, incompletely imaged. No acute or suspicious osseous finding. IMPRESSION: 1. No acute findings. No evidence of active pneumonia or pulmonary edema. 2. Chronic consolidation/atelectasis at the RIGHT lung base, stable compared to CT abdomen of 12/03/2018. Mild chronic-appearing atelectasis at the LEFT lung base. 3. Cardiomegaly. No pericardial effusion.  4. Additional chronic/incidental findings detailed above. Aortic Atherosclerosis (ICD10-I70.0). Electronically Signed   By: Franki Cabot M.D.   On: 02/18/2019 14:54   Dg Chest Port 1 View  Result Date: 03/11/2019 CLINICAL DATA:  Respiratory failure.  Ventilator support. EXAM: PORTABLE CHEST 1 VIEW COMPARISON:  03/10/2019 FINDINGS: Endotracheal tube tip is 3 cm above the carina. Orogastric or nasogastric tube enters the stomach. Persistent bilateral lower lobe atelectasis and or pneumonia. The upper lungs remain clear. No significant change since yesterday. IMPRESSION: No change since yesterday. Endotracheal tube and orogastric tube well positioned. Persistent bilateral lower lobe atelectasis and or. Electronically Signed    By: Nelson Chimes M.D.   On: 03/11/2019 08:09   Dg Chest Port 1 View  Result Date: 03/10/2019 CLINICAL DATA:  Respiratory failure. EXAM: PORTABLE CHEST 1 VIEW COMPARISON:  03/09/2019 FINDINGS: Endotracheal tube remains with the tip approximately 3 cm above the carina. Stable cardiac enlargement. Gastric decompression tube extends into the stomach. Fairly stable bilateral lower lobe consolidation. There may remain small bilateral pleural effusions. No overt edema or evidence of pneumothorax. Enlarged main pulmonary artery contour. IMPRESSION: Stable bilateral lower lobe consolidation with probable small bilateral pleural effusions. Electronically Signed   By: Aletta Edouard M.D.   On: 03/10/2019 10:27   Dg Chest Port 1 View  Result Date: 03/09/2019 CLINICAL DATA:  Acute respiratory failure. EXAM: PORTABLE CHEST 1 VIEW COMPARISON:  One-view chest x-ray 03/01/2019 FINDINGS: Endotracheal tube terminates 2.5 cm above the carina. Heart is mildly enlarged. Mitral annular calcifications are again seen. Aortic atherosclerosis is present. NG tube courses off the inferior border the film. Progressive bibasilar airspace disease is present, right greater than left. Bilateral effusions are suspected. IMPRESSION: 1. Increasing bilateral lower lobe airspace disease/pneumonia. 2. Suspect bilateral pleural effusions. 3. Endotracheal tube has advanced, now terminating 2.5 cm above the carina. Electronically Signed   By: San Morelle M.D.   On: 03/09/2019 07:38   Dg Chest Port 1 View  Result Date: 03/01/2019 CLINICAL DATA:  Status post intubation. EXAM: PORTABLE CHEST 1 VIEW COMPARISON:  Radiograph of February 27, 2019. FINDINGS: Stable cardiomegaly. Atherosclerosis of thoracic aorta is noted. Endotracheal tube is in grossly good position. No pneumothorax is noted. Minimal left basilar subsegmental atelectasis is noted with small left pleural effusion. Right lung is clear. Severe degenerative changes seen involving the  right glenohumeral joint. IMPRESSION: Endotracheal tube in grossly good position. Minimal left basilar subsegmental atelectasis is noted with minimal left pleural effusion. Aortic Atherosclerosis (ICD10-I70.0). Electronically Signed   By: Marijo Conception M.D.   On: 03/01/2019 11:12   Dg Chest Port 1 View  Result Date: 02/27/2019 CLINICAL DATA:  Respiratory failure, fever EXAM: PORTABLE CHEST 1 VIEW COMPARISON:  02/27/2019 FINDINGS: Cardiomegaly. Right basilar atelectasis or infiltrate again noted, slightly worsened since prior study. No confluent opacity on the left. Possible small right effusion. No acute bony abnormality. Advanced degenerative changes in the shoulders. IMPRESSION: Cardiomegaly. Right lower lobe atelectasis or pneumonia with small right effusion. Electronically Signed   By: Rolm Baptise M.D.   On: 02/27/2019 23:12   Dg Chest Port 1 View  Result Date: 02/18/2019 CLINICAL DATA:  Short of breath EXAM: PORTABLE CHEST 1 VIEW COMPARISON:  11/08/2018 FINDINGS: Cardiac enlargement. Mild vascular congestion without edema. Elevated right hemidiaphragm with right lower lobe atelectasis unchanged. Left lung clear. Advanced degenerative change in both shoulders. IMPRESSION: Chronic elevation right hemidiaphragm with right lower lobe atelectasis Mild vascular congestion without edema. Electronically Signed   By: Juanda Crumble  Carlis Abbott M.D.   On: 02/18/2019 12:25   [place] Ekg Site Rite  Result Date: 03/02/2019 If Site Rite image not attached, placement could not be confirmed due to current cardiac rhythm.   Assessment & Plan    afib-converted to nsr with iv amiodarone. Will continue with iv amiodarone until taking po.   Signed, Javier Docker Nhu Glasby MD 03/14/2019, 6:35 PM  Pager: [phone]

## 2019-03-14 NOTE — Progress Notes (Addendum)
Pharmacy Electrolyte Monitoring Consult:  Pharmacy consulted to assist in monitoring and replacing electrolytes in this 85 y.o. female admitted on 02/27/2019 with acute on chronic hypoxic hypercarbic respiratory failure.    Labs:  Sodium (mmol/L)  Date Value  03/14/2019 146 (H)  04/10/2015 137  07/08/2014 144   Potassium (mmol/L)  Date Value  03/14/2019 3.4 (L)  07/08/2014 3.7   Magnesium (mg/dL)  Date Value  03/14/2019 2.1  05/08/2014 2.6 (H)   Phosphorus (mg/dL)  Date Value  03/13/2019 3.5   Calcium (mg/dL)  Date Value  03/14/2019 8.4 (L)   Calcium, Total (mg/dL)  Date Value  07/08/2014 8.4 (L)   Albumin (g/dL)  Date Value  02/23/2019 3.1 (L)  07/08/2014 2.9 (L)     Assessment/Plan: Patient continued on furosemide 40mg  IV daily. Will order potassium chloride 40mg  PO X 1 and additional potassium chloride 55mEq PO x 1 with evening meds.   Unless otherwise clinically indicated, will defer labs until at least 5/6.   Will replace for goal potassium ~ 4 and goal magnesium ~ 2.   Pharmacy will continue to monitor and adjust per consult.   Derico Mitton L 03/14/2019 1:44 PM

## 2019-03-14 NOTE — Progress Notes (Signed)
Patient with continued work of breathing and tachypnea on BiPAP.  Dr. Duwayne Heck notified.  Order to be placed

## 2019-03-15 ENCOUNTER — Inpatient Hospital Stay: Payer: Medicare Other

## 2019-03-15 ENCOUNTER — Encounter: Payer: Self-pay | Admitting: Hematology and Oncology

## 2019-03-15 LAB — URINALYSIS, COMPLETE (UACMP) WITH MICROSCOPIC
Bilirubin Urine: NEGATIVE
Glucose, UA: NEGATIVE mg/dL
Hgb urine dipstick: NEGATIVE
Ketones, ur: NEGATIVE mg/dL
Leukocytes,Ua: NEGATIVE
Nitrite: NEGATIVE
Protein, ur: NEGATIVE mg/dL
Specific Gravity, Urine: 1.012 (ref 1.005–1.030)
pH: 5 (ref 5.0–8.0)

## 2019-03-15 LAB — BASIC METABOLIC PANEL
Anion gap: 9 (ref 5–15)
BUN: 30 mg/dL — ABNORMAL HIGH (ref 8–23)
CO2: 31 mmol/L (ref 22–32)
Calcium: 8.6 mg/dL — ABNORMAL LOW (ref 8.9–10.3)
Chloride: 107 mmol/L (ref 98–111)
Creatinine, Ser: 0.92 mg/dL (ref 0.44–1.00)
GFR calc Af Amer: >60 mL/min (ref >60)
GFR calc non Af Amer: 57 mL/min — ABNORMAL LOW (ref >60)
Glucose, Bld: 125 mg/dL — ABNORMAL HIGH (ref 70–99)
Potassium: 3.2 mmol/L — ABNORMAL LOW (ref 3.5–5.1)
Sodium: 147 mmol/L — ABNORMAL HIGH (ref 135–145)

## 2019-03-15 LAB — BLOOD GAS, ARTERIAL
Acid-Base Excess: 3.9 mmol/L — ABNORMAL HIGH (ref 0.0–2.0)
Bicarbonate: 33.1 mmol/L — ABNORMAL HIGH (ref 20.0–28.0)
Delivery systems: POSITIVE
Expiratory PAP: 8
FIO2: 0.4
MECHVT: 400 mL
Mechanical Rate: 12
O2 Saturation: 82.7 %
Patient temperature: 37
pCO2 arterial: 72 mmHg (ref 32.0–48.0)
pH, Arterial: 7.27 — ABNORMAL LOW (ref 7.350–7.450)
pO2, Arterial: 54 mmHg — ABNORMAL LOW (ref 83.0–108.0)

## 2019-03-15 LAB — GLUCOSE, CAPILLARY
Glucose-Capillary: 109 mg/dL — ABNORMAL HIGH (ref 70–99)
Glucose-Capillary: 135 mg/dL — ABNORMAL HIGH (ref 70–99)

## 2019-03-15 LAB — MAGNESIUM: Magnesium: 2 mg/dL (ref 1.7–2.4)

## 2019-03-15 MED ORDER — HALOPERIDOL LACTATE 2 MG/ML PO CONC
0.5000 mg | ORAL | Status: DC | PRN
  Filled 2019-03-15: qty 0.3

## 2019-03-15 MED ORDER — SODIUM CHLORIDE 0.9% FLUSH: 3.0000 mL | Freq: Two times a day (BID) | INTRAVENOUS | Status: DC

## 2019-03-15 MED ORDER — METHYLPREDNISOLONE SODIUM SUCC 125 MG IJ SOLR
60.0000 mg | Freq: Once | INTRAMUSCULAR | Status: AC
  Administered 2019-03-15: 60 mg via INTRAVENOUS
  Filled 2019-03-15: qty 2

## 2019-03-15 MED ORDER — MORPHINE 100MG IN NS 100ML (1MG/ML) PREMIX INFUSION
2.0000 mg/h | INTRAVENOUS | Status: DC
  Administered 2019-03-15: 12:00:00 2 mg/h via INTRAVENOUS
  Filled 2019-03-15: qty 100

## 2019-03-15 MED ORDER — GLYCOPYRROLATE 0.2 MG/ML IJ SOLN: 0.2000 mg | INTRAMUSCULAR | Status: DC | PRN

## 2019-03-15 MED ORDER — LORAZEPAM 2 MG/ML IJ SOLN: 1.0000 mg | INTRAMUSCULAR | Status: DC | PRN

## 2019-03-15 MED ORDER — GLYCOPYRROLATE 1 MG PO TABS: 1.0000 mg | ORAL_TABLET | ORAL | Status: DC | PRN

## 2019-03-15 MED ORDER — HALOPERIDOL LACTATE 5 MG/ML IJ SOLN: 0.5000 mg | INTRAMUSCULAR | Status: DC | PRN

## 2019-03-15 MED ORDER — SODIUM CHLORIDE 0.9% FLUSH: 3.0000 mL | INTRAVENOUS | Status: DC | PRN

## 2019-03-15 MED ORDER — SODIUM CHLORIDE 0.9 % IV SOLN: 250.0000 mL | INTRAVENOUS | Status: DC | PRN

## 2019-03-15 MED ORDER — SODIUM CHLORIDE 0.9 % IV BOLUS
1000.0000 mL | Freq: Once | INTRAVENOUS | Status: AC
  Administered 2019-03-15: 12:00:00 1000 mL via INTRAVENOUS

## 2019-03-15 MED ORDER — LORAZEPAM 2 MG/ML PO CONC: 1.0000 mg | ORAL | Status: DC | PRN

## 2019-03-15 MED ORDER — POLYVINYL ALCOHOL 1.4 % OP SOLN
1.0000 [drp] | Freq: Four times a day (QID) | OPHTHALMIC | Status: DC | PRN
  Filled 2019-03-15: qty 15

## 2019-03-15 MED ORDER — AMIODARONE LOAD VIA INFUSION
150.0000 mg | Freq: Once | INTRAVENOUS | Status: AC
  Administered 2019-03-15: 10:00:00 150 mg via INTRAVENOUS
  Filled 2019-03-15: qty 83.34

## 2019-03-15 MED ORDER — FUROSEMIDE 10 MG/ML IJ SOLN
40.0000 mg | Freq: Once | INTRAMUSCULAR | Status: AC
  Administered 2019-03-15: 40 mg via INTRAVENOUS
  Filled 2019-03-15: qty 4

## 2019-03-15 MED ORDER — HALOPERIDOL 0.5 MG PO TABS
0.5000 mg | ORAL_TABLET | ORAL | Status: DC | PRN
  Filled 2019-03-15: qty 1

## 2019-03-15 MED ORDER — POTASSIUM CHLORIDE 10 MEQ/50ML IV SOLN
10.0000 meq | INTRAVENOUS | Status: AC
  Administered 2019-03-15 (×4): 10 meq via INTRAVENOUS
  Filled 2019-03-15 (×4): qty 50

## 2019-03-15 MED ORDER — MORPHINE BOLUS VIA INFUSION
2.0000 mg | INTRAVENOUS | Status: DC | PRN
  Filled 2019-03-15: qty 6

## 2019-03-15 MED ORDER — LORAZEPAM 1 MG PO TABS: 1.0000 mg | ORAL_TABLET | ORAL | Status: DC | PRN

## 2019-03-15 MED ORDER — BIOTENE DRY MOUTH MT LIQD: 15.0000 mL | OROMUCOSAL | Status: DC | PRN

## 2019-03-16 LAB — URINE CULTURE: Culture: NO GROWTH

## 2019-03-21 LAB — BLOOD GAS, ARTERIAL
Acid-Base Excess: 4.9 mmol/L — ABNORMAL HIGH (ref 0.0–2.0)
Bicarbonate: 31.4 mmol/L — ABNORMAL HIGH (ref 20.0–28.0)
FIO2: 0.3
O2 Saturation: 93.7 %
PEEP: 5 cmH2O
Patient temperature: 37
Pressure support: 5 cmH2O
pCO2 arterial: 53 mmHg — ABNORMAL HIGH (ref 32.0–48.0)
pH, Arterial: 7.38 (ref 7.350–7.450)
pO2, Arterial: 71 mmHg — ABNORMAL LOW (ref 83.0–108.0)

## 2019-03-24 ENCOUNTER — Telehealth: Payer: Self-pay | Admitting: Internal Medicine

## 2019-03-24 NOTE — Telephone Encounter (Signed)
Death certificate has been placed in Dr. Kasa's folder.    

## 2019-03-28 NOTE — Telephone Encounter (Signed)
Death Certificate complete. [place] aware ready for pickup.

## 2019-04-11 NOTE — Progress Notes (Signed)
Spoke with Dr Ubaldo Glassing. Patients heart rate 150-160's. Patient is not coming out of the rate. Per [facility] give amiodorone bolus. Dr. Duwayne Heck is aware.

## 2019-04-11 NOTE — Significant Event (Signed)
Patient continued to deteriorate overnight.  Issues with increasing FiO2 requirement.  PSVT with poor control rates at a time as high as the 150s.  She required rebolus of amiodarone ordered by cardiology.  Chest x-ray performed shows that she has left lung atelectasis likely due to mucous plugging.  She was switched from BiPAP to AVAPS.  With improvement on oxygenation however mentation remains poor.  Arterial blood gases showed a pH of 7.27 PCO2 of 72 and a PaO2 of 54 prior to changes being made.  The patient is not going to tolerate bronchoscopy due to her current terminal status.  Palliative care team present.  Discussion with the patient's granddaughter and POA, Yolanda Wells, via phone.  Yolanda Wells added in conference her uncle.  They continue to insist that they could not make assessment of transitioning to comfort care over the phone.  Explained the limitations during COVID-19 however if the patient is made comfort care they can come and be present with her at bedside.  As we were discussing over the phone the patient deteriorated and Yolanda Wells stated that she would come in.  Call had to be interrupted due to the patient continued to deteriorate.  Patient's POA presented with 2 other members of the family.  After the family scented to the bedside patient was subsequently transitioned to comfort care.

## 2019-04-11 NOTE — Progress Notes (Addendum)
Daily Progress Note   Patient Name: Yolanda Wells       Date: Mar 30, 2019 DOB: [birth year]  Age: 85 y.o. MRN#: [id number] Attending Physician: Hillary Bow, MD Primary Care Physician: [name], MD Admit Date: 03/06/2019  Reason for Consultation/Follow-up: Establishing goals of care  Subjective: Patient resting in bed on BIPAP. She does not respond to voice or touch. She is now hypotensive, and per chest xray, has left lung collapse. She has declined. She appears to be inevidably entering the dying process. Spoke with [name] to update her, she states she wants to speak with CCM.   As CCM was speaking with granddaughter, patient's BP decreased critically low and O2 sat decreased. Family coming to bedside, CCM to bedside to help stabilize patient.  Grandaughter and H- POA [name] to bedside. She was very tearful, and after spending a few moments with her grandmother, decided to make her comfort care and remove the BIPAP. She does not want her grandmother to suffer. CCM in to speak with family as well as chaplain service.  Medications ordered for comfort.    Patient died peacefully at 12:14.   I completed a MOST form today and the signed original was placed in the chart. A photocopy was placed in the chart to be scanned into EMR. The patient's POA outlined their wishes for the following treatment decisions:  Cardiopulmonary Resuscitation: Do Not Attempt Resuscitation (DNR/No CPR)  Medical Interventions: Comfort Measures: Keep clean, warm, and dry. Use medication by any route, positioning, wound care, and other measures to relieve pain and suffering. Use oxygen, suction and manual treatment of airway obstruction as needed for comfort. Do not transfer to the hospital unless comfort needs cannot  be met in current location.  Antibiotics: No antibiotics (use other measures to relieve symptoms)  IV Fluids: No IV fluids (provide other measures to ensure comfort)  Feeding Tube: No feeding tube   POA papers on the chart listing [name] Green as HPOA.     Length of Stay: 22  Current Medications: Scheduled Meds:  . aspirin  81 mg Oral Daily  . chlorhexidine  15 mL Mouth Rinse BID  . enoxaparin  100 mg Subcutaneous Q12H  . furosemide  40 mg Intravenous Daily  . insulin aspart  0-9 Units Subcutaneous Q4H  . mouth rinse  15 mL Mouth  Rinse q12n4p  . metoprolol tartrate  25 mg Oral BID  . senna-docusate  1 tablet Oral BID  . sodium chloride flush  10-40 mL Intracatheter Q12H  . sodium chloride flush  3 mL Intravenous Q12H    Continuous Infusions: . sodium chloride Stopped (03/06/19 0016)  . sodium chloride Stopped (03/02/19 1445)  . amiodarone 30 mg/hr (Mar 20, 2019 1019)    PRN Meds: sodium chloride, acetaminophen **OR** acetaminophen, fentaNYL (SUBLIMAZE) injection, hydrALAZINE, ipratropium, levalbuterol, lip balm, [DISCONTINUED] ondansetron **OR** ondansetron (ZOFRAN) IV, polyethylene glycol, sodium chloride flush  General: Intubated Lungs: Intubated  Neurologic: intubated         Vital Signs: BP (!) 96/52   Pulse 88   Temp 99.5 F (37.5 C)   Resp (!) 33   Ht 5' 2.01" (1.575 m)   Wt 98.2 kg   SpO2 98%   BMI 39.59 kg/m  SpO2: SpO2: 98 % O2 Device: O2 Device: Bi-PAP O2 Flow Rate: O2 Flow Rate (L/min): 4 L/min  Intake/output summary:   Intake/Output Summary (Last 24 hours) at 03-20-19 1110 Last data filed at 03/20/19 0604 Gross per 24 hour  Intake 365.22 ml  Output 500 ml  Net -134.78 ml   LBM: Last BM Date: 03/14/19 Baseline Weight: Weight: 90 kg Most recent weight: Weight: 98.2 kg       Palliative Assessment/Data: INTUBATED     Flowsheet Rows     Most Recent Value  Intake Tab  Referral Department  Hospitalist  Unit at Time of Referral  Med/Surg Unit   Palliative Care Primary Diagnosis  Pulmonary  Date Notified  03/09/2019  Palliative Care Type  Return patient Palliative Care  Reason for referral  Clarify Goals of Care  Date of Admission  03/03/2019  Date first seen by Palliative Care  02/22/19  # of days Palliative referral response time  1 Day(s)  # of days IP prior to Palliative referral  0  Clinical Assessment  Palliative Performance Scale Score  30%  Pain Max last 24 hours  Not able to report  Pain Min Last 24 hours  Not able to report  Dyspnea Max Last 24 Hours  Not able to report  Dyspnea Min Last 24 hours  Not able to report  Psychosocial & Spiritual Assessment  Palliative Care Outcomes      Patient Active Problem List   Diagnosis Date Noted  . Acute respiratory failure with hypoxia and hypercapnia (HCC)   . Acute on chronic respiratory failure ([place]) 03/10/2019  . Decreased appetite 02/02/2019  . Poor appetite 12/24/2018  . Unsteady 12/24/2018  . Palliative care encounter 12/24/2018  . SOB (shortness of breath) 10/28/2018  . Shortness of breath 09/27/2018  . COPD exacerbation ([place]) 09/22/2018  . Sepsis ([place]) 09/22/2018  . Lower GI bleed 09/09/2018  . AMS (altered mental status) 08/30/2018  . Pressure injury of skin 08/13/2018  . B12 deficiency 08/04/2018  . Anemia 06/07/2018  . Secondary myelofibrosis ([place]) 02/17/2018  . Goals of care, counseling/discussion 12/23/2017  . Palliative care by specialist   . DNR (do not resuscitate) discussion   . Weakness generalized   . Chronic diastolic CHF (congestive heart failure) ([place]) 07/28/2017  . Vascular dementia without behavioral disturbance ([place]) 07/08/2017  . Influenza with respiratory manifestation 12/01/2016  . Respiratory distress 11/23/2016  . Urinary incontinence in female 09/02/2016  . Vision loss of right eye 08/26/2016  . Blindness of right eye 08/26/2016  . Inability to ambulate due to hip 10/12/2015  . Colostomy in place (  [place]) 10/04/2015  . Long term  current use of anticoagulant therapy 10/03/2015  . Leukocytosis 09/25/2015  . CAP (community acquired pneumonia) 09/24/2015  . COPD (chronic obstructive pulmonary disease) ([place]) 09/24/2015  . UTI (urinary tract infection) 07/16/2015  . Abdominal wall cellulitis 07/15/2015  . DVT (deep venous thrombosis) ([place]) 07/15/2015  . HTN (hypertension) 07/15/2015  . Polycythemia vera ([place]) 07/15/2015  . Arthritis 07/15/2015  . Pulmonary emboli ([place]) 05/25/2015  . Diverticulosis of colon with hemorrhage 04/02/2015  . Diverticulosis of intestine with bleeding 04/02/2015  . Fall 01/10/2015  . Osteoarthritis 01/10/2015  . Hammertoe 09/02/2012  . S/P transmetatarsal amputation of foot ([place]) 09/02/2012  . Onychomycosis 09/02/2012  . Other specified dermatoses 09/02/2012  . S/P hip replacement 08/23/2012  . Glenohumeral arthritis 06/28/2012    Palliative Care Assessment & Plan   Patient Profile: Per [name], NP: 85 y.o. female  with past medical history of  heart failure, long-term anticoagulation use, polycythemia vera, collagen vascular disease cerebellar stroke, history of PE with IVC filter, history of DVT left lower extremity / right lower extremity, bilateral hip replacements, osteoarthritis, diverticulosis with intestinal bleeding, colostomy 2016, hypertension, arthritis, partial right foot amputation admitted on 03/07/2019 with acute on chronic hypoxic/hypercarbic respiratory failure likely secondary to acute on chronic diastolic heart failure.  PMT consulted for goals of care, 8 admissions and 3 ED visits in 6 months  Recommendations/Plan:  Patient made comfort care and died at 12:14 pm.     Code Status:    Code Status Orders  (From admission, onward)         Start     Ordered   03/01/2019 1349  Full code  Continuous     02/17/2019 1349        Code Status History    Date Active Date Inactive Code Status Order ID Comments User Context   02/18/2019 1813 02/20/2019 2309 Full Code  [id number]  Otila Back, MD Inpatient   11/08/2018 1532 11/10/2018 2032 DNR [id number]  Dustin Flock, MD Inpatient   10/28/2018 1334 10/31/2018 1621 DNR [id number]  Dustin Flock, MD Inpatient   09/27/2018 1901 09/30/2018 1545 DNR [id number]  Loletha Grayer, MD ED   09/22/2018 0133 09/25/2018 2201 Full Code [id number]  Amelia Jo, MD Inpatient   09/09/2018 1240 09/12/2018 2008 Full Code [id number]  Loletha Grayer, MD ED   08/30/2018 2018 09/01/2018 2004 DNR [id number]  Nicholes Mango, MD ED   08/30/2018 1903 08/30/2018 2018 Full Code [id number]  Nicholes Mango, MD ED   08/13/2018 1038 08/15/2018 1836 Full Code [id number]  Loletha Grayer, MD Inpatient   08/12/2018 2001 08/13/2018 1038 DNR [id number]  Gorden Harms, MD ED   08/12/2018 2001 08/12/2018 2001 Full Code [id number]  Gorden Harms, MD ED   06/15/2018 1635 06/18/2018 2009 Full Code [id number]  [name], MD Inpatient   02/24/2018 0347 02/27/2018 0027 Full Code [id number]  Amelia Jo, MD Inpatient   10/15/2017 1141 10/16/2017 2204 Full Code [id number]  Knox Royalty, NP Inpatient   10/13/2017 2246 10/15/2017 1141 DNR [id number]  Lance Coon, MD ED   10/06/2017 1454 10/09/2017 2058 DNR [id number]  Nicholes Mango, MD Inpatient   10/06/2017 1111 10/06/2017 1453 Full Code [id number]  Nicholes Mango, MD Inpatient   05/27/2017 1954 05/28/2017 2013 Full Code [id number]  Vaughan Basta, MD Inpatient   05/23/2017 2322 05/26/2017 1614 Full Code [id number]  Lance Coon, MD Inpatient   04/10/2017 0325 04/11/2017 1916 Full Code [id number]  Harrie Foreman, MD Inpatient   11/23/2016 [phone]  12/01/2016 1935 Full Code [id number]  Harrie Foreman, MD Inpatient   08/26/2016 0445 08/26/2016 1923 Full Code [id number]  Saundra Shelling, MD Inpatient   09/22/2015 1755 09/25/2015 1844 Full Code [id number]  Demetrios Loll, MD ED   09/15/2015 0341 09/17/2015 1741 Full Code [id number]  Harrie Foreman, MD Inpatient   07/16/2015 0151 07/20/2015 2334 Full Code [id number]  Lance Coon, MD Inpatient   05/25/2015 0834 05/29/2015 1616 Full Code [id number]  Harrie Foreman, MD Inpatient   04/02/2015 0606 04/08/2015 1651 Full Code [id number]  Harrie Foreman, MD Inpatient       Time in/ out: 10:30-12:35  Total 125 min

## 2019-04-11 NOTE — Progress Notes (Signed)
Pt hypoxic on ABG, therefore increased FiO2 to 55% will continue to monitor and assess pt   Marda Stalker, [place] Pager [phone] (please enter 7 digits) [place] Pager [phone] (please enter 7 digits)

## 2019-04-11 NOTE — Progress Notes (Signed)
Family at bedside. Patients heart rate and blood pressure dropped. Patient remains unresponsive. Pallative care and Dr Duwayne Heck in room with family.

## 2019-04-11 NOTE — Progress Notes (Signed)
Per [facility], confirmed that she would like comfort care for grandmother with Crystal PA. This RN re-confirmed and granddaughter stated comfort care wishes. I then removed code cart.

## 2019-04-11 NOTE — Progress Notes (Signed)
Family and palliative care at bedside. Patient made comfort care and morphine drip started. Patient asystole, respirations, auscultation ceased at 12:14 PM. This RN and Charlie RN pronounced patient. Dr Mortimer Fries, supervisor, amd [place] donor notified. Supervisor will get funeral home sheet completed.

## 2019-04-11 NOTE — Progress Notes (Signed)
[place] [facility] [place] NAME: Yolanda Wells    MR#:  [id number]  DATE OF BIRTH:  07/19/1932    CHIEF COMPLAINT:   Chief Complaint  Patient presents with  . Altered Mental Status  . Shortness of Breath   Bipap on and patient unresponsive.  REVIEW OF SYSTEMS:  Unresponsive  DRUG ALLERGIES:  No Known Allergies  VITALS:  Blood pressure (!) 96/52, pulse 88, temperature 99.5 F (37.5 C), resp. rate (!) 33, height 5' 2.01" (1.575 m), weight 98.2 kg, SpO2 98 %.  PHYSICAL EXAMINATION:   Physical Exam  GENERAL:  85 y.o.-year-old patient lying in the bed, obese.  Unresponsive EYES: Pupils equal, round, reactive to light and accommodation. No scleral icterus. Extraocular muscles intact.  HEENT: Head atraumatic, normocephalic.  NECK:  Supple, no jugular venous distention. No thyroid enlargement, no tenderness.  LUNGS: Decrease breath sounds bilateral CARDIOVASCULAR: S1, S2 normal. No murmurs, rubs, or gallops.  ABDOMEN: Soft, nontender, nondistended. Bowel sounds present. No organomegaly or mass.  EXTREMITIES: No cyanosis, clubbing. LE edema. PSYCHIATRIC: Unresponsive  LABORATORY PANEL:   CBC Recent Labs  Lab 03/12/19 0628  WBC 16.6*  HGB 8.1*  HCT 29.4*  PLT 536*   ------------------------------------------------------------------------------------------------------------------ Chemistries  Recent Labs  Lab 04/07/19 0303  NA 147*  K 3.2*  CL 107  CO2 31  GLUCOSE 125*  BUN 30*  CREATININE 0.92  CALCIUM 8.6*  MG 2.0   ------------------------------------------------------------------------------------------------------------------  Cardiac Enzymes No results for input(s): TROPONINI in the last 168 hours. ------------------------------------------------------------------------------------------------------------------  RADIOLOGY:  Dg Chest Port 1 View  Result Date: 07-Apr-2019 CLINICAL DATA:  Acute respiratory failure  EXAM: PORTABLE CHEST 1 VIEW COMPARISON:  Four days ago FINDINGS: Extubation. Interval collapse of the left lung. Cardiomegaly with mitral annular calcification. Improved aeration on the right, possibly dependent flow pleural fluid. IMPRESSION: 1. Interval left lung collapse. 2. Atelectasis and/or pleural fluid at the right base. Electronically Signed   By: Monte Fantasia M.D.   On: 04/07/19 10:09     ASSESSMENT AND PLAN:   * Acute on chronic hypoxic hypercarbic respiratory failure failure due to COPD exacerbation,, pneumonia  * Left lung collapse.  On BiPAP.  Patient is partial code/DNI  * Pulm HTN, COPD and now PNA Extubated 03/11/2019 Intensivist follow-up. Discussed with Dr. Patsey Berthold  * RLL HCAP Treated with IV vancomycin and cefepime. Finished course  * Hypotension Improved  * Acute metabolic encephalopathy secondary to hypercarbia.  Has baseline dementia. Has NIV set up at home.  * Atrial  fibrillation with RVR, SVT On therapeutic dose of Lovenox Unable to use scheduled rate control medications due to hypotension  On amiodarone.  * COPD exacerbation-improved Continue nebulization therapy Treated with IV steroids and now stopped  *DVT prophylaxis Lovenox-therapeutic dose  * Dementia, agitation, continue Seroquel, Ativan. Palliative care consulted and following.   Patient on BiPAP and obtunded.  Now has left lung collapse.  Worsening.  Partial code.  Seems appropriate for comfort measures.  Palliative care consulted and waiting for family meeting. Discussed with Dr. Patsey Berthold.  CODE STATUS: Partial code(DNI)  TOTAL TIME TAKING CARE OF THIS PATIENT: 35 minutes.   [name] Chloe Flis M.D on 07-Apr-2019 at 11:05 AM  Between 7am to 6pm - Pager - [phone]  After 6pm go to [email or web address] - password [email or web address] [facility]  Office  [phone]  CC: Primary care physician; [name], MD  Note: This dictation was prepared with Dragon dictation  along with  smaller phrase technology. Any transcriptional errors that result from this process are unintentional.

## 2019-04-11 NOTE — Progress Notes (Signed)
BP 74/45. Dr Duwayne Heck notified. Palliative care on phone with POA. Awaiting for orders. Continue to monitor.

## 2019-04-11 NOTE — Progress Notes (Signed)
Dr Duwayne Heck notified of patients chest xray. She will review. No new orders at this time.

## 2019-04-11 NOTE — Death Summary Note (Signed)
DEATH SUMMARY   Patient Details  Name: Yolanda Wells MRN: [id number] DOB: 1931/11/14  Admission/Discharge Information   Admit Date:  03-11-19  Date of Death:   2019/04/02   Time of Death:  1214PM  Length of Stay: 22  Referring Physician: [name], MD   Reason(s) for Hospitalization  Admitted for severe resp failure and COPD exacerbation  Diagnoses  Preliminary cause of death: COPD/pneumonia Secondary Diagnoses (including complications and co-morbidities):  Active Problems:   Acute on chronic respiratory failure (HCC)   Acute respiratory failure with hypoxia and hypercapnia (HCC) PROBLEMS Acute on chronic hypoxemic/hypercarbic respiratory failure History of severe COPD/Restrictive physiology Pulmonary hypertension, likely group 4 Recurrent PSVT, uncontrolled  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  Patient is in dying  Process.  Family understands the situation.  They have consented and agreed to DNR/DNI and would like to proceed with Comfort care measures.   Family are satisfied with Plan of action and management. All questions  answered       Pertinent Labs and Studies  Significant Diagnostic Studies Dg Chest 1 View  Result Date: 03/11/2019 CLINICAL DATA:  Acute shortness of breath and hypoxia. EXAM: CHEST  1 VIEW COMPARISON:  02/18/2019 chest radiograph, chest CT and prior studies FINDINGS: Cardiomediastinal silhouette is unchanged. RIGHT basilar atelectasis again noted. There is no evidence of focal airspace disease, pulmonary edema, suspicious pulmonary nodule/mass, pleural effusion, or pneumothorax. No acute bony abnormalities are identified. IMPRESSION: RIGHT basilar atelectasis. Electronically Signed   By: Margarette Canada M.D.   On: 11-Mar-2019 13:39   Dg Abd 1 View  Result Date: 03/01/2019 CLINICAL DATA:  Nasogastric tube placement. EXAM: ABDOMEN - 1 VIEW COMPARISON:  Radiographs of November 26, 2018. FINDINGS: The bowel gas pattern is normal. Surgical staples are seen in the left side of the abdomen. IVC filter is noted. Distal tip of nasogastric tube is seen in expected position of distal stomach or proximal duodenum. IMPRESSION: Distal tip of nasogastric tube seen in expected position of distal stomach or proximal duodenum. Electronically Signed   By: Marijo Conception M.D.   On: 03/01/2019 11:10   Ct Head Wo Contrast  Result Date: 03-11-19 CLINICAL DATA:  85 year old female with altered mental status. EXAM: CT HEAD WITHOUT CONTRAST TECHNIQUE: Contiguous axial images were obtained from the base of the skull through the vertex without intravenous contrast. COMPARISON:  08/30/2018 MR, 08/30/2018 CT and prior studies FINDINGS: Brain: No evidence of acute infarction, hemorrhage, hydrocephalus, extra-axial collection or mass lesion/mass effect. Atrophy and mild chronic small-vessel white matter ischemic changes noted. Vascular: Atherosclerotic calcifications again noted. Skull: Normal. Negative for fracture or focal lesion. Sinuses/Orbits: No acute finding. Other: None. IMPRESSION: 1. No evidence of acute intracranial  abnormality. 2. Atrophy and mild chronic small-vessel white matter ischemic  changes. Electronically Signed   By: Margarette Canada M.D.   On: 02/14/2019 12:38   Ct Chest Wo Contrast  Result Date: 02/18/2019 CLINICAL DATA:  Shortness of breath for the last 2 days. History of COPD on 2 L of oxygen, CHF, history of pulmonary embolism and other medical problems. Occasional dry cough. No fever. EXAM: CT CHEST WITHOUT CONTRAST TECHNIQUE: Multidetector CT imaging of the chest was performed following the standard protocol without IV contrast. COMPARISON:  Chest CTs dated 11/22/2018 and 09/21/2018. FINDINGS: Cardiovascular: Aortic atherosclerosis. No thoracic aortic aneurysm. Cardiomegaly. No pericardial effusion seen. Prominent mitral annulus calcifications again noted. Mediastinum/Nodes: No mass or enlarged lymph nodes seen within the mediastinum or perihilar regions. Esophagus is unremarkable. Trachea appears normal. Lungs/Pleura: Chronic consolidation at the RIGHT lung base, not appreciably changed compared to CT abdomen of 12/03/2018, questionably increased compared to earlier chest CT exams. Additional mild chronic appearing atelectasis at the LEFT lung base. Lungs otherwise clear. No pleural effusion or pneumothorax seen. No consolidations or ground-glass opacities to suggest COVID-19 infection. Upper Abdomen: Large gallstone within the gallbladder, incompletely imaged, also seen on previous exams. RIGHT renal cyst, partially imaged. No acute findings within the upper abdomen. Musculoskeletal: Degenerative change throughout the scoliotic thoracolumbar spine, moderate in degree. Advanced degenerative changes at the bilateral shoulders, incompletely imaged. No acute or suspicious osseous finding. IMPRESSION: 1. No acute findings. No evidence of active pneumonia or pulmonary edema. 2. Chronic consolidation/atelectasis at the RIGHT lung base, stable compared to CT abdomen of 12/03/2018. Mild chronic-appearing atelectasis at  the LEFT lung base. 3. Cardiomegaly. No pericardial effusion. 4. Additional chronic/incidental findings detailed above. Aortic Atherosclerosis (ICD10-I70.0). Electronically Signed   By: Franki Cabot M.D.   On: 02/18/2019 14:54   Dg Chest Port 1 View  Result Date: 03/24/19 CLINICAL DATA:  Acute respiratory failure EXAM: PORTABLE CHEST 1 VIEW COMPARISON:  Four days ago FINDINGS: Extubation. Interval collapse of the left lung. Cardiomegaly with mitral annular calcification. Improved aeration on the right, possibly dependent flow pleural fluid. IMPRESSION: 1. Interval left lung collapse. 2. Atelectasis and/or pleural fluid at the right base. Electronically Signed   By: Monte Fantasia M.D.   On: 2019-03-24 10:09   Dg Chest Port 1 View  Result Date: 03/11/2019 CLINICAL DATA:  Respiratory failure.  Ventilator support. EXAM: PORTABLE CHEST 1 VIEW COMPARISON:  03/10/2019 FINDINGS: Endotracheal tube tip is 3 cm above the carina. Orogastric or nasogastric tube enters the stomach. Persistent bilateral lower lobe atelectasis and or pneumonia. The upper lungs remain clear. No significant change since yesterday. IMPRESSION: No change since yesterday. Endotracheal tube and orogastric tube well positioned. Persistent bilateral lower lobe atelectasis and or. Electronically Signed   By: Nelson Chimes M.D.   On: 03/11/2019 08:09   Dg Chest Port 1 View  Result Date: 03/10/2019 CLINICAL DATA:  Respiratory failure. EXAM: PORTABLE CHEST 1 VIEW COMPARISON:  03/09/2019 FINDINGS: Endotracheal tube remains with the tip approximately 3 cm above the carina. Stable cardiac enlargement. Gastric decompression tube extends into the stomach. Fairly stable bilateral lower lobe consolidation. There may remain small bilateral pleural effusions. No overt edema or evidence of pneumothorax. Enlarged main pulmonary artery contour. IMPRESSION: Stable bilateral lower lobe consolidation with probable small bilateral pleural effusions.  Electronically Signed   By: Aletta Edouard M.D.   On: 03/10/2019 10:27   Dg Chest Port 1 View  Result Date: 03/09/2019 CLINICAL DATA:  Acute respiratory failure. EXAM: PORTABLE CHEST 1 VIEW COMPARISON:  One-view chest x-ray 03/01/2019 FINDINGS: Endotracheal tube  terminates 2.5 cm above the carina. Heart is mildly enlarged. Mitral annular calcifications are again seen. Aortic atherosclerosis is present. NG tube courses off the inferior border the film. Progressive bibasilar airspace disease is present, right greater than left. Bilateral effusions are suspected. IMPRESSION: 1. Increasing bilateral lower lobe airspace disease/pneumonia. 2. Suspect bilateral pleural effusions. 3. Endotracheal tube has advanced, now terminating 2.5 cm above the carina. Electronically Signed   By: San Morelle M.D.   On: 03/09/2019 07:38   Dg Chest Port 1 View  Result Date: 03/01/2019 CLINICAL DATA:  Status post intubation. EXAM: PORTABLE CHEST 1 VIEW COMPARISON:  Radiograph of February 27, 2019. FINDINGS: Stable cardiomegaly. Atherosclerosis of thoracic aorta is noted. Endotracheal tube is in grossly good position. No pneumothorax is noted. Minimal left basilar subsegmental atelectasis is noted with small left pleural effusion. Right lung is clear. Severe degenerative changes seen involving the right glenohumeral joint. IMPRESSION: Endotracheal tube in grossly good position. Minimal left basilar subsegmental atelectasis is noted with minimal left pleural effusion. Aortic Atherosclerosis (ICD10-I70.0). Electronically Signed   By: Marijo Conception M.D.   On: 03/01/2019 11:12   Dg Chest Port 1 View  Result Date: 02/27/2019 CLINICAL DATA:  Respiratory failure, fever EXAM: PORTABLE CHEST 1 VIEW COMPARISON:  02/19/2019 FINDINGS: Cardiomegaly. Right basilar atelectasis or infiltrate again noted, slightly worsened since prior study. No confluent opacity on the left. Possible small right effusion. No acute bony abnormality.  Advanced degenerative changes in the shoulders. IMPRESSION: Cardiomegaly. Right lower lobe atelectasis or pneumonia with small right effusion. Electronically Signed   By: Rolm Baptise M.D.   On: 02/27/2019 23:12   Dg Chest Port 1 View  Result Date: 02/18/2019 CLINICAL DATA:  Short of breath EXAM: PORTABLE CHEST 1 VIEW COMPARISON:  11/08/2018 FINDINGS: Cardiac enlargement. Mild vascular congestion without edema. Elevated right hemidiaphragm with right lower lobe atelectasis unchanged. Left lung clear. Advanced degenerative change in both shoulders. IMPRESSION: Chronic elevation right hemidiaphragm with right lower lobe atelectasis Mild vascular congestion without edema. Electronically Signed   By: Franchot Gallo M.D.   On: 02/18/2019 12:25   [place] Ekg Site Rite  Result Date: 03/02/2019 If Site Rite image not attached, placement could not be confirmed due to current cardiac rhythm.   Microbiology No results found for this or any previous visit (from the past 240 hour(s)).  Lab Basic Metabolic Panel: Recent Labs  Lab 03/11/19 0402 03/12/19 0443 03/13/19 0542 03/14/19 0408 03/16/2019 0303  NA 143 143 145 146* 147*  K 4.2 4.1 3.8 3.4* 3.2*  CL 108 111 107 107 107  CO2 29 29 30  33* 31  GLUCOSE 180* 118* 113* 139* 125*  BUN 30* 25* 22 23 30*  CREATININE 0.48 0.41* 0.50 0.46 0.92  CALCIUM 8.5* 8.4* 8.9 8.4* 8.6*  MG  --  1.9 2.0 2.1 2.0  PHOS  --  3.3 3.5  --   --    Liver Function Tests: No results for input(s): AST, ALT, ALKPHOS, BILITOT, PROT, ALBUMIN in the last 168 hours. No results for input(s): LIPASE, AMYLASE in the last 168 hours. No results for input(s): AMMONIA in the last 168 hours. CBC: Recent Labs  Lab 03/09/19 0438 03/10/19 0500 03/11/19 0402 03/12/19 0628  WBC 13.7* 11.5* 11.4* 16.6*  NEUTROABS  --   --  9.7* 14.6*  HGB 8.6* 7.6* 7.5* 8.1*  HCT 31.0* 27.1* 27.3* 29.4*  MCV 88.1 87.4 86.4 87.0  PLT 348 319 335 536*   Cardiac Enzymes: No results for  input(s):  CKTOTAL, CKMB, CKMBINDEX, TROPONINI in the last 168 hours. Sepsis Labs: Recent Labs  Lab 03/09/19 0438 03/10/19 0500 03/11/19 0402 03/12/19 0628  WBC 13.7* 11.5* 11.4* 16.6*     Laney Bagshaw 04-09-19, 12:11 PM

## 2019-04-11 NOTE — Progress Notes (Signed)
   04/12/19 1200  Clinical Encounter Type  Visited With Patient and family together;Health care provider  Visit Type Follow-up  Referral From Nurse  Spiritual Encounters  Spiritual Needs Prayer;Ritual;Emotional;Grief support  Stress Factors  Family Stress Factors Loss  Ch was called to attend to the family of pt who was actively dying. Three grandchildren (two granddaughters and one grandson) were present. Ch prayed with the family and provided a caring presence and let them share their thoughts and emotions. Palliative care team and ICU ch were also present providing additional support and consolation. Pt passed surrounded by love and support.

## 2019-04-11 NOTE — Progress Notes (Addendum)
ABG results pending pt now obtunded on Bipap.    Marda Stalker, [place] Pager [phone] (please enter 7 digits) PCCM Consult Pager [phone] (please enter 7 digits)

## 2019-04-11 DEATH — deceased

## 2021-12-23 ENCOUNTER — Encounter: Payer: Self-pay | Admitting: Specialist
